# Patient Record
Sex: Female | Born: 1945
Health system: Southern US, Community
[De-identification: ages and names within clinical notes are randomized; demographics above are authoritative.]

## PROBLEM LIST (undated history)

## (undated) DIAGNOSIS — T7840XA Allergy, unspecified, initial encounter: Secondary | ICD-10-CM

## (undated) DIAGNOSIS — F329 Major depressive disorder, single episode, unspecified: Secondary | ICD-10-CM

## (undated) DIAGNOSIS — R062 Wheezing: Secondary | ICD-10-CM

## (undated) DIAGNOSIS — I499 Cardiac arrhythmia, unspecified: Secondary | ICD-10-CM

## (undated) DIAGNOSIS — I517 Cardiomegaly: Secondary | ICD-10-CM

## (undated) DIAGNOSIS — E1140 Type 2 diabetes mellitus with diabetic neuropathy, unspecified: Secondary | ICD-10-CM

## (undated) DIAGNOSIS — E785 Hyperlipidemia, unspecified: Secondary | ICD-10-CM

## (undated) DIAGNOSIS — T8859XA Other complications of anesthesia, initial encounter: Secondary | ICD-10-CM

## (undated) DIAGNOSIS — J189 Pneumonia, unspecified organism: Secondary | ICD-10-CM

## (undated) DIAGNOSIS — I340 Nonrheumatic mitral (valve) insufficiency: Secondary | ICD-10-CM

## (undated) DIAGNOSIS — F1190 Opioid use, unspecified, uncomplicated: Secondary | ICD-10-CM

## (undated) DIAGNOSIS — M51369 Other intervertebral disc degeneration, lumbar region without mention of lumbar back pain or lower extremity pain: Secondary | ICD-10-CM

## (undated) DIAGNOSIS — D649 Anemia, unspecified: Secondary | ICD-10-CM

## (undated) DIAGNOSIS — I255 Ischemic cardiomyopathy: Secondary | ICD-10-CM

## (undated) DIAGNOSIS — F419 Anxiety disorder, unspecified: Secondary | ICD-10-CM

## (undated) DIAGNOSIS — K559 Vascular disorder of intestine, unspecified: Secondary | ICD-10-CM

## (undated) DIAGNOSIS — I350 Nonrheumatic aortic (valve) stenosis: Secondary | ICD-10-CM

## (undated) DIAGNOSIS — F32A Depression, unspecified: Secondary | ICD-10-CM

## (undated) DIAGNOSIS — R011 Cardiac murmur, unspecified: Secondary | ICD-10-CM

## (undated) DIAGNOSIS — Z87898 Personal history of other specified conditions: Secondary | ICD-10-CM

## (undated) DIAGNOSIS — I2510 Atherosclerotic heart disease of native coronary artery without angina pectoris: Secondary | ICD-10-CM

## (undated) DIAGNOSIS — H9190 Unspecified hearing loss, unspecified ear: Secondary | ICD-10-CM

## (undated) DIAGNOSIS — M2141 Flat foot [pes planus] (acquired), right foot: Secondary | ICD-10-CM

## (undated) DIAGNOSIS — T4145XA Adverse effect of unspecified anesthetic, initial encounter: Secondary | ICD-10-CM

## (undated) DIAGNOSIS — M2142 Flat foot [pes planus] (acquired), left foot: Secondary | ICD-10-CM

## (undated) DIAGNOSIS — H269 Unspecified cataract: Secondary | ICD-10-CM

## (undated) DIAGNOSIS — I739 Peripheral vascular disease, unspecified: Secondary | ICD-10-CM

## (undated) DIAGNOSIS — K219 Gastro-esophageal reflux disease without esophagitis: Secondary | ICD-10-CM

## (undated) DIAGNOSIS — M1990 Unspecified osteoarthritis, unspecified site: Secondary | ICD-10-CM

## (undated) DIAGNOSIS — Z8619 Personal history of other infectious and parasitic diseases: Secondary | ICD-10-CM

## (undated) DIAGNOSIS — I10 Essential (primary) hypertension: Secondary | ICD-10-CM

## (undated) DIAGNOSIS — G629 Polyneuropathy, unspecified: Secondary | ICD-10-CM

## (undated) DIAGNOSIS — M5136 Other intervertebral disc degeneration, lumbar region: Secondary | ICD-10-CM

## (undated) DIAGNOSIS — Z9289 Personal history of other medical treatment: Secondary | ICD-10-CM

## (undated) DIAGNOSIS — M722 Plantar fascial fibromatosis: Secondary | ICD-10-CM

## (undated) DIAGNOSIS — I5020 Unspecified systolic (congestive) heart failure: Secondary | ICD-10-CM

## (undated) DIAGNOSIS — Z8719 Personal history of other diseases of the digestive system: Secondary | ICD-10-CM

## (undated) HISTORY — PX: TONSILLECTOMY: SUR1361

## (undated) HISTORY — PX: MOUTH SURGERY: SHX715

## (undated) HISTORY — PX: TENOTOMY ACHILLES TENDON: SUR1337

## (undated) HISTORY — DX: Hyperlipidemia, unspecified: E78.5

## (undated) HISTORY — DX: Flat foot (pes planus) (acquired), right foot: M21.41

## (undated) HISTORY — DX: Allergy, unspecified, initial encounter: T78.40XA

## (undated) HISTORY — DX: Personal history of other specified conditions: Z87.898

## (undated) HISTORY — DX: Unspecified osteoarthritis, unspecified site: M19.90

## (undated) HISTORY — PX: OTHER SURGICAL HISTORY: SHX169

## (undated) HISTORY — DX: Cardiac arrhythmia, unspecified: I49.9

## (undated) HISTORY — DX: Peripheral vascular disease, unspecified: I73.9

## (undated) HISTORY — DX: Unspecified cataract: H26.9

## (undated) HISTORY — DX: Anxiety disorder, unspecified: F41.9

## (undated) HISTORY — DX: Nonrheumatic aortic (valve) stenosis: I35.0

## (undated) HISTORY — DX: Personal history of other medical treatment: Z92.89

## (undated) HISTORY — DX: Anemia, unspecified: D64.9

## (undated) HISTORY — DX: Opioid use, unspecified, uncomplicated: F11.90

## (undated) HISTORY — DX: Depression, unspecified: F32.A

## (undated) HISTORY — DX: Flat foot (pes planus) (acquired), left foot: M21.42

## (undated) HISTORY — DX: Personal history of other infectious and parasitic diseases: Z86.19

## (undated) HISTORY — DX: Cardiac murmur, unspecified: R01.1

## (undated) HISTORY — DX: Gastro-esophageal reflux disease without esophagitis: K21.9

## (undated) HISTORY — PX: HUMERUS FRACTURE SURGERY: SHX670

## (undated) HISTORY — PX: OVARY SURGERY: SHX727

## (undated) HISTORY — DX: Other intervertebral disc degeneration, lumbar region: M51.36

## (undated) HISTORY — DX: Major depressive disorder, single episode, unspecified: F32.9

## (undated) HISTORY — DX: Other intervertebral disc degeneration, lumbar region without mention of lumbar back pain or lower extremity pain: M51.369

## (undated) HISTORY — DX: Type 2 diabetes mellitus with diabetic neuropathy, unspecified: E11.40

## (undated) HISTORY — PX: EYE SURGERY: SHX253

## (undated) HISTORY — DX: Plantar fascial fibromatosis: M72.2

## (undated) HISTORY — DX: Cardiomegaly: I51.7

## (undated) HISTORY — PX: ABDOMINAL HYSTERECTOMY: SHX81

## (undated) NOTE — *Deleted (*Deleted)
   [facility]  Name: Andrea Holden  MRN: [id number] DOB: [birth year]  Chief Complaint/ HPI  Andrea Holden,  57 y.o. , female presents for their Follow-Up CCM visit with the clinical pharmacist via telephone due to COVID-19 Pandemic.  PCP : Danelle Berry, PA-C  Their chronic conditions include: ***  Office Visits:***  Consult Visit:***  Medications: Outpatient Encounter Medications as of 04/18/2020  Medication Sig Note  . acetaminophen (TYLENOL) 325 MG tablet Take 2 tablets (650 mg total) by mouth every 6 (six) hours as needed for mild pain, moderate pain, headache or fever.   Marland Kitchen albuterol (VENTOLIN HFA) 108 (90 Base) MCG/ACT inhaler INHALE 2 PUFFS INTO THE LUNGS EVERY 6 HOURS AS NEEDED FOR WHEEZING OR SHORTNESS OF BREATH   . atorvastatin (LIPITOR) 10 MG tablet Take 1 tablet (10 mg total) by mouth at bedtime.   . Cholecalciferol (VITAMIN D3) 250 MCG (10000 UT) TABS Take 10,000 Units by mouth daily.    . clopidogrel (PLAVIX) 75 MG tablet TAKE 1 TABLET BY MOUTH ONCE A DAY   . fluticasone (FLONASE) 50 MCG/ACT nasal spray Place 2 sprays into both nostrils daily as needed. 01/06/2020: PRN allergies  . gabapentin (NEURONTIN) 400 MG capsule Take 1 capsule (400 mg total) by mouth 2 (two) times daily.   Marland Kitchen ipratropium-albuterol (DUONEB) 0.5-2.5 (3) MG/3ML SOLN Take 3 mLs by nebulization 3 (three) times daily as needed. (Patient taking differently: Take 3 mLs by nebulization 3 (three) times daily as needed (Shortness of breath). )   . Krill Oil 1000 MG CAPS Take 1,000 mg by mouth every other day.  (Patient not taking: Reported on 01/12/2020)   . lisinopril (ZESTRIL) 10 MG tablet Take 1 tablet (10 mg total) by mouth daily.   . montelukast (SINGULAIR) 10 MG tablet Take 1 tablet (10 mg total) by mouth at bedtime.   . Multiple Vitamins-Minerals (MULTIVITAMIN ADULTS 50+) TABS Take 1 tablet by mouth daily.   . pantoprazole (PROTONIX) 20 MG tablet Take 1 tablet (20 mg total) by  mouth daily.   Marland Kitchen Respiratory Therapy Supplies (NEBULIZER/TUBING/MOUTHPIECE) KIT Disp one nebulizer machine, tubing set and mouthpiece kit   . sertraline (ZOLOFT) 100 MG tablet TAKE 1 AND 1/2 TABLETS (150 MG TOTAL) BY MOUTH DAILY (Patient taking differently: Take 100 mg by mouth daily. )   . terbinafine (LAMISIL) 250 MG tablet Take 1 tablet (250 mg total) by mouth daily.   Marland Kitchen umeclidinium-vilanterol (ANORO ELLIPTA) 62.5-25 MCG/INH AEPB Inhale 1 puff into the lungs daily.   . varenicline (CHANTIX) 1 MG tablet Take 0.5 mg tablet by mouth 1x daily for 3 days, then increase to 0.5 mg tablet 2x daily for 4 days, then increase to 1 mg tablet 2x daily.   . vitamin C (ASCORBIC ACID) 500 MG tablet Take 1,000 mg by mouth every other day.  (Patient not taking: Reported on 01/12/2020)   . zinc gluconate 50 MG tablet Take 50 mg by mouth daily.    No facility-administered encounter medications on file as of 04/18/2020.     Current Diagnosis/Assessment:  Goals Addressed   None     {[facility] Diagnosis/[phone]}

---

## 1968-07-01 HISTORY — PX: GALLBLADDER SURGERY: SHX652

## 1968-07-01 HISTORY — PX: CHOLECYSTECTOMY: SHX55

## 2006-09-03 ENCOUNTER — Ambulatory Visit: Payer: Self-pay | Admitting: Family Medicine

## 2007-05-13 ENCOUNTER — Emergency Department: Payer: Self-pay | Admitting: Internal Medicine

## 2008-03-30 ENCOUNTER — Ambulatory Visit: Payer: Self-pay | Admitting: Orthopedic Surgery

## 2008-04-13 ENCOUNTER — Ambulatory Visit: Payer: Self-pay | Admitting: Orthopedic Surgery

## 2008-07-01 HISTORY — PX: GASTRIC BYPASS: SHX52

## 2008-12-22 ENCOUNTER — Ambulatory Visit: Payer: Self-pay | Admitting: Family Medicine

## 2008-12-29 ENCOUNTER — Encounter: Payer: Self-pay | Admitting: Sports Medicine

## 2008-12-29 ENCOUNTER — Ambulatory Visit: Payer: Self-pay | Admitting: Family Medicine

## 2009-01-29 ENCOUNTER — Ambulatory Visit: Payer: Self-pay | Admitting: Family Medicine

## 2009-01-29 ENCOUNTER — Encounter: Payer: Self-pay | Admitting: Sports Medicine

## 2009-03-01 ENCOUNTER — Encounter: Payer: Self-pay | Admitting: Sports Medicine

## 2009-08-31 ENCOUNTER — Encounter: Payer: Self-pay | Admitting: Family Medicine

## 2009-09-29 ENCOUNTER — Encounter: Payer: Self-pay | Admitting: Family Medicine

## 2009-10-29 ENCOUNTER — Encounter: Payer: Self-pay | Admitting: Family Medicine

## 2009-10-31 ENCOUNTER — Encounter: Admission: RE | Admit: 2009-10-31 | Discharge: 2009-10-31 | Payer: Self-pay | Admitting: Neurology

## 2009-11-25 ENCOUNTER — Observation Stay: Payer: Self-pay | Admitting: Orthopedic Surgery

## 2009-11-28 ENCOUNTER — Encounter: Payer: Self-pay | Admitting: Internal Medicine

## 2009-11-29 ENCOUNTER — Encounter: Payer: Self-pay | Admitting: Internal Medicine

## 2009-11-29 ENCOUNTER — Encounter: Payer: Self-pay | Admitting: Family Medicine

## 2009-12-29 ENCOUNTER — Encounter: Payer: Self-pay | Admitting: Internal Medicine

## 2010-03-09 ENCOUNTER — Encounter: Payer: Self-pay | Admitting: Orthopedic Surgery

## 2010-03-31 ENCOUNTER — Encounter: Payer: Self-pay | Admitting: Orthopedic Surgery

## 2010-05-01 ENCOUNTER — Encounter: Payer: Self-pay | Admitting: Orthopedic Surgery

## 2010-05-31 ENCOUNTER — Encounter: Payer: Self-pay | Admitting: Orthopedic Surgery

## 2010-10-24 ENCOUNTER — Ambulatory Visit: Payer: Self-pay | Admitting: Specialist

## 2010-10-30 ENCOUNTER — Ambulatory Visit: Payer: Self-pay | Admitting: Specialist

## 2010-11-16 ENCOUNTER — Ambulatory Visit: Payer: Self-pay | Admitting: Specialist

## 2010-11-23 ENCOUNTER — Ambulatory Visit: Payer: Self-pay | Admitting: Gastroenterology

## 2010-11-23 ENCOUNTER — Emergency Department: Payer: Self-pay | Admitting: Internal Medicine

## 2010-11-27 LAB — PATHOLOGY REPORT

## 2011-02-08 ENCOUNTER — Encounter (HOSPITAL_COMMUNITY): Payer: Self-pay | Admitting: Radiology

## 2011-02-08 ENCOUNTER — Emergency Department (HOSPITAL_COMMUNITY): Payer: 59

## 2011-02-08 ENCOUNTER — Emergency Department (HOSPITAL_COMMUNITY)
Admission: EM | Admit: 2011-02-08 | Discharge: 2011-02-08 | Disposition: A | Discharge status: Home / Self Care | Payer: 59 | Attending: Emergency Medicine | Admitting: Emergency Medicine

## 2011-02-08 DIAGNOSIS — M545 Low back pain, unspecified: Secondary | ICD-10-CM | POA: Insufficient documentation

## 2011-02-08 DIAGNOSIS — J45909 Unspecified asthma, uncomplicated: Secondary | ICD-10-CM | POA: Insufficient documentation

## 2011-02-08 DIAGNOSIS — I517 Cardiomegaly: Secondary | ICD-10-CM | POA: Insufficient documentation

## 2011-02-08 DIAGNOSIS — M546 Pain in thoracic spine: Secondary | ICD-10-CM | POA: Insufficient documentation

## 2011-02-08 DIAGNOSIS — R079 Chest pain, unspecified: Secondary | ICD-10-CM | POA: Insufficient documentation

## 2011-02-08 DIAGNOSIS — E119 Type 2 diabetes mellitus without complications: Secondary | ICD-10-CM | POA: Insufficient documentation

## 2011-02-08 DIAGNOSIS — I10 Essential (primary) hypertension: Secondary | ICD-10-CM | POA: Insufficient documentation

## 2011-02-08 DIAGNOSIS — R0602 Shortness of breath: Secondary | ICD-10-CM | POA: Insufficient documentation

## 2011-02-08 HISTORY — DX: Essential (primary) hypertension: I10

## 2011-02-08 LAB — POCT I-STAT TROPONIN I: Troponin i, poc: 0.02 ng/mL (ref 0.00–0.08)

## 2011-02-08 LAB — COMPREHENSIVE METABOLIC PANEL
ALT: 15 U/L (ref 0–35)
AST: 15 U/L (ref 0–37)
Albumin: 3.6 g/dL (ref 3.5–5.2)
Alkaline Phosphatase: 73 U/L (ref 39–117)
BUN: 34 mg/dL — ABNORMAL HIGH (ref 6–23)
CO2: 27 mEq/L (ref 19–32)
Calcium: 9.5 mg/dL (ref 8.4–10.5)
Chloride: 100 mEq/L (ref 96–112)
Creatinine, Ser: 0.87 mg/dL (ref 0.50–1.10)
GFR calc Af Amer: >60 mL/min (ref >60)
GFR calc non Af Amer: >60 mL/min (ref >60)
Glucose, Bld: 164 mg/dL — ABNORMAL HIGH (ref 70–99)
Potassium: 4.8 mEq/L (ref 3.5–5.1)
Sodium: 137 mEq/L (ref 135–145)
Total Bilirubin: 0.2 mg/dL — ABNORMAL LOW (ref 0.3–1.2)
Total Protein: 7.3 g/dL (ref 6.0–8.3)

## 2011-02-08 LAB — CBC
HCT: 37.5 % (ref 36.0–46.0)
Hemoglobin: 12.4 g/dL (ref 12.0–15.0)
MCH: 28.1 pg (ref 26.0–34.0)
MCHC: 33.1 g/dL (ref 30.0–36.0)
MCV: 84.8 fL (ref 78.0–100.0)
Platelets: 195 10*3/uL (ref 150–400)
RBC: 4.42 MIL/uL (ref 3.87–5.11)
RDW: 14.2 % (ref 11.5–15.5)
WBC: 11.6 10*3/uL — ABNORMAL HIGH (ref 4.0–10.5)

## 2011-02-08 LAB — DIFFERENTIAL
Basophils Absolute: 0 10*3/uL (ref 0.0–0.1)
Basophils Relative: 0 % (ref 0–1)
Eosinophils Absolute: 0.1 10*3/uL (ref 0.0–0.7)
Eosinophils Relative: 1 % (ref 0–5)
Lymphocytes Relative: 10 % — ABNORMAL LOW (ref 12–46)
Lymphs Abs: 1.1 10*3/uL (ref 0.7–4.0)
Monocytes Absolute: 1.1 10*3/uL — ABNORMAL HIGH (ref 0.1–1.0)
Monocytes Relative: 10 % (ref 3–12)
Neutro Abs: 9.2 10*3/uL — ABNORMAL HIGH (ref 1.7–7.7)
Neutrophils Relative %: 79 % — ABNORMAL HIGH (ref 43–77)

## 2011-02-08 LAB — CK TOTAL AND CKMB (NOT AT ARMC)
CK, MB: 2.6 ng/mL (ref 0.3–4.0)
Relative Index: INVALID (ref 0.0–2.5)
Total CK: 81 U/L (ref 7–177)

## 2011-02-08 LAB — D-DIMER, QUANTITATIVE (NOT AT ARMC): D-Dimer, Quant: 0.95 ug/mL-FEU — ABNORMAL HIGH (ref 0.00–0.48)

## 2011-02-08 MED ORDER — IOHEXOL 350 MG/ML SOLN
100.0000 mL | Freq: Once | INTRAVENOUS | Status: AC | PRN
  Administered 2011-02-08: 100 mL via INTRAVENOUS

## 2011-02-12 ENCOUNTER — Ambulatory Visit: Payer: Self-pay | Admitting: Specialist

## 2011-03-05 ENCOUNTER — Inpatient Hospital Stay: Payer: Self-pay | Admitting: Bariatrics

## 2011-03-21 ENCOUNTER — Ambulatory Visit: Payer: Self-pay | Admitting: Specialist

## 2011-04-01 ENCOUNTER — Ambulatory Visit: Payer: Self-pay | Admitting: Specialist

## 2011-04-02 ENCOUNTER — Encounter: Payer: Self-pay | Admitting: Specialist

## 2011-05-02 ENCOUNTER — Encounter: Payer: Self-pay | Admitting: Specialist

## 2011-06-01 ENCOUNTER — Encounter: Payer: Self-pay | Admitting: Specialist

## 2011-07-02 ENCOUNTER — Encounter: Payer: Self-pay | Admitting: Specialist

## 2011-07-02 HISTORY — PX: COLON SURGERY: SHX602

## 2011-08-14 ENCOUNTER — Encounter: Payer: Self-pay | Admitting: Family Medicine

## 2011-08-30 ENCOUNTER — Encounter: Payer: Self-pay | Admitting: Family Medicine

## 2011-09-30 ENCOUNTER — Encounter: Payer: Self-pay | Admitting: Family Medicine

## 2011-10-30 ENCOUNTER — Encounter: Payer: Self-pay | Admitting: Family Medicine

## 2011-11-30 ENCOUNTER — Encounter: Payer: Self-pay | Admitting: Family Medicine

## 2011-12-30 ENCOUNTER — Encounter: Payer: Self-pay | Admitting: Family Medicine

## 2012-01-30 ENCOUNTER — Encounter: Payer: Self-pay | Admitting: Family Medicine

## 2012-07-01 DIAGNOSIS — D649 Anemia, unspecified: Secondary | ICD-10-CM

## 2012-07-01 HISTORY — DX: Anemia, unspecified: D64.9

## 2012-07-28 ENCOUNTER — Encounter: Payer: Self-pay | Admitting: Internal Medicine

## 2012-08-01 ENCOUNTER — Encounter: Payer: Self-pay | Admitting: Internal Medicine

## 2012-08-29 ENCOUNTER — Encounter: Payer: Self-pay | Admitting: Internal Medicine

## 2012-09-29 ENCOUNTER — Encounter: Payer: Self-pay | Admitting: Internal Medicine

## 2012-10-29 ENCOUNTER — Encounter: Payer: Self-pay | Admitting: Internal Medicine

## 2013-06-03 ENCOUNTER — Ambulatory Visit (INDEPENDENT_AMBULATORY_CARE_PROVIDER_SITE_OTHER): Payer: Medicare (Managed Care) | Admitting: Podiatrist

## 2013-06-03 ENCOUNTER — Ambulatory Visit (INDEPENDENT_AMBULATORY_CARE_PROVIDER_SITE_OTHER): Payer: Medicare (Managed Care)

## 2013-06-03 ENCOUNTER — Encounter: Payer: Self-pay | Admitting: Podiatrist

## 2013-06-03 VITALS — BP 146/65 | HR 60 | Resp 16 | Ht 63.0 in | Wt 220.0 lb

## 2013-06-03 DIAGNOSIS — B351 Tinea unguium: Secondary | ICD-10-CM

## 2013-06-03 DIAGNOSIS — M79671 Pain in right foot: Secondary | ICD-10-CM

## 2013-06-03 DIAGNOSIS — M79609 Pain in unspecified limb: Secondary | ICD-10-CM

## 2013-06-03 NOTE — Progress Notes (Signed)
   Subjective:    Patient ID: Andrea Holden, female    DOB: 01/29/1946, 67 y.o.   MRN: [id number]  Patient presents today as a new patient with compaints of bilateral great toenail pain and calluses bilaterally.  States they are painful and he has no relief.  she also has pain at the ankle and states she is getting braces from Dr. Donell Beers. HPI Comments: N PAIN L B/L GREAT TOENAILS / CALLUS B/L D 23YRS O SLOWLY C WORSE A EVERYTHNING T PT TRIMS TOENAILS, PUMA STONE    N PAIN/SWELLING L RIGHT FOOT -DORSAL AND ANKLE D 59YR O ? C WORSE/BETTER A WALKING, TOUCHING IT T TOPICAL GEL, SOAKS IN FOOT SOAP     Review of Systems  Constitutional: Positive for activity change.  HENT: Negative.   Eyes: Positive for visual disturbance.  Respiratory: Positive for wheezing.   Cardiovascular: Positive for leg swelling.  Gastrointestinal: Positive for nausea and vomiting.  Endocrine: Negative.   Genitourinary: Negative.   Musculoskeletal: Positive for back pain.       JOINT PAIN DIFFICULTY WALKING  Skin:       CHANGE IN NAILS  Allergic/Immunologic: Negative.   Neurological: Positive for weakness, light-headedness and numbness.  Hematological: Bruises/bleeds easily.  Psychiatric/Behavioral: Negative.        Objective:   Physical Exam GENERAL APPEARANCE: Alert,No acute distress.  VASCULAR: Pedal pulses palpable dp and pt bilateral.  Capillary refill time is immediate to all digits,  Proximal to distal cooling it warm to warm.   NEUROLOGIC: sensation is decreased epicritically and protectively to 5.07 monofilament at 3/5 sites bilateral.  Light touch is intact bilateral, vibratory sensation intact bilateral,  MUSCULOSKELETAL: pes planus deformity is present bilateral.  Pain along the sinus tarsi is noted right.   DERMATOLOGIC: symptomatic, hyperkeratotic lesions are present bilaterally on the great toes. she has mildly incurvated toenails present on the medial border of the left hallux and  both borders of the right hallux nails.  They are not infected but uncomfortable    Assessment & Plan:  Diabetes with pes planus, neuropathy; calluses and nail discomfort.  Conservative therapies were recommended and carried out at today's visit consisting of a nail trim and callus trim.  Recommended diabetic shoes and patient is not interested.  Recommended an injection on the symptomatic ankle and she is waiting for a brace from Dr. Donell Beers.  Patient to return at prn request.

## 2013-06-03 NOTE — Patient Instructions (Signed)
Diabetes and Foot Care Diabetes may cause you to have problems because of poor blood supply (circulation) to your feet and legs. This may cause the skin on your feet to become thinner, break easier, and heal more slowly. Your skin may become dry, and the skin may peel and crack. You may also have nerve damage in your legs and feet causing decreased feeling in them. You may not notice minor injuries to your feet that could lead to infections or more serious problems. Taking care of your feet is one of the most important things you can do for yourself.  HOME CARE INSTRUCTIONS  Wear shoes at all times, even in the house. Do not go barefoot. Bare feet are easily injured.  Check your feet daily for blisters, cuts, and redness. If you cannot see the bottom of your feet, use a mirror or ask someone for help.  Wash your feet with warm water (do not use hot water) and mild soap. Then pat your feet and the areas between your toes until they are completely dry. Do not soak your feet as this can dry your skin.  Apply a moisturizing lotion or petroleum jelly (that does not contain alcohol and is unscented) to the skin on your feet and to dry, brittle toenails. Do not apply lotion between your toes.  Trim your toenails straight across. Do not dig under them or around the cuticle. File the edges of your nails with an emery board or nail file.  Do not cut corns or calluses or try to remove them with medicine.  Wear clean socks or stockings every day. Make sure they are not too tight. Do not wear knee-high stockings since they may decrease blood flow to your legs.  Wear shoes that fit properly and have enough cushioning. To break in new shoes, wear them for just a few hours a day. This prevents you from injuring your feet. Always look in your shoes before you put them on to be sure there are no objects inside.  Do not cross your legs. This may decrease the blood flow to your feet.  If you find a minor scrape,  cut, or break in the skin on your feet, keep it and the skin around it clean and dry. These areas may be cleansed with mild soap and water. Do not cleanse the area with peroxide, alcohol, or iodine.  When you remove an adhesive bandage, be sure not to damage the skin around it.  If you have a wound, look at it several times a day to make sure it is healing.  Do not use heating pads or hot water bottles. They may burn your skin. If you have lost feeling in your feet or legs, you may not know it is happening until it is too late.  Make sure your health care provider performs a complete foot exam at least annually or more often if you have foot problems. Report any cuts, sores, or bruises to your health care provider immediately. SEEK MEDICAL CARE IF:   You have an injury that is not healing.  You have cuts or breaks in the skin.  You have an ingrown nail.  You notice redness on your legs or feet.  You feel burning or tingling in your legs or feet.  You have pain or cramps in your legs and feet.  Your legs or feet are numb.  Your feet always feel cold. SEEK IMMEDIATE MEDICAL CARE IF:   There is increasing redness,   swelling, or pain in or around a wound.  There is a red line that goes up your leg.  Pus is coming from a wound.  You develop a fever or as directed by your health care provider.  You notice a bad smell coming from an ulcer or wound. Document Released: 06/14/2000 Document Revised: 02/17/2013 Document Reviewed: 11/24/2012 Exit[employer] Patient Information 2014 [employer].  

## 2014-07-01 HISTORY — PX: OTHER SURGICAL HISTORY: SHX169

## 2014-09-07 DIAGNOSIS — Z8719 Personal history of other diseases of the digestive system: Secondary | ICD-10-CM | POA: Insufficient documentation

## 2014-09-07 DIAGNOSIS — K56609 Unspecified intestinal obstruction, unspecified as to partial versus complete obstruction: Secondary | ICD-10-CM | POA: Insufficient documentation

## 2014-10-11 ENCOUNTER — Inpatient Hospital Stay
Admit: 2014-10-11 | Disposition: A | Discharge status: Home / Self Care | Payer: Self-pay | Attending: Internal Medicine | Admitting: Internal Medicine

## 2014-10-11 LAB — CBC
HCT: 17.1 % — ABNORMAL LOW (ref 35.0–47.0)
HGB: 5.5 g/dL — ABNORMAL LOW (ref 12.0–16.0)
MCH: 26 pg (ref 26.0–34.0)
MCHC: 32.2 g/dL (ref 32.0–36.0)
MCV: 81 fL (ref 80–100)
Platelet: 427 10*3/uL (ref 150–440)
RBC: 2.12 10*6/uL — ABNORMAL LOW (ref 3.80–5.20)
RDW: 15.3 % — ABNORMAL HIGH (ref 11.5–14.5)
WBC: 14.4 10*3/uL — ABNORMAL HIGH (ref 3.6–11.0)

## 2014-10-11 LAB — CBC WITH DIFFERENTIAL/PLATELET
Basophil #: 0.1 10*3/uL (ref 0.0–0.1)
Basophil %: 0.9 %
Eosinophil #: 0.2 10*3/uL (ref 0.0–0.7)
Eosinophil %: 1.2 %
HCT: 18.9 % — ABNORMAL LOW (ref 35.0–47.0)
HGB: 6.1 g/dL — ABNORMAL LOW (ref 12.0–16.0)
Lymphocyte #: 3.9 10*3/uL — ABNORMAL HIGH (ref 1.0–3.6)
Lymphocyte %: 22.5 %
MCH: 26.4 pg (ref 26.0–34.0)
MCHC: 32.5 g/dL (ref 32.0–36.0)
MCV: 81 fL (ref 80–100)
Monocyte #: 1.3 x10 3/mm — ABNORMAL HIGH (ref 0.2–0.9)
Monocyte %: 7.6 %
Neutrophil #: 11.8 10*3/uL — ABNORMAL HIGH (ref 1.4–6.5)
Neutrophil %: 67.8 %
Platelet: 373 10*3/uL (ref 150–440)
RBC: 2.33 10*6/uL — ABNORMAL LOW (ref 3.80–5.20)
RDW: 15.1 % — ABNORMAL HIGH (ref 11.5–14.5)
WBC: 17.4 10*3/uL — ABNORMAL HIGH (ref 3.6–11.0)

## 2014-10-11 LAB — COMPREHENSIVE METABOLIC PANEL
Albumin: 3.1 g/dL — ABNORMAL LOW
Alkaline Phosphatase: 66 U/L
Anion Gap: 6 — ABNORMAL LOW (ref 7–16)
BUN: 42 mg/dL — ABNORMAL HIGH
Bilirubin,Total: 0.4 mg/dL
Calcium, Total: 8.5 mg/dL — ABNORMAL LOW
Chloride: 103 mmol/L
Co2: 25 mmol/L
Creatinine: 0.83 mg/dL
EGFR (African American): >60
EGFR (Non-African Amer.): >60
Glucose: 209 mg/dL — ABNORMAL HIGH
Potassium: 4.1 mmol/L
SGOT(AST): 21 U/L
SGPT (ALT): 13 U/L — ABNORMAL LOW
Sodium: 134 mmol/L — ABNORMAL LOW
Total Protein: 6.3 g/dL — ABNORMAL LOW

## 2014-10-11 LAB — TROPONIN I: Troponin-I: <0.03 ng/mL

## 2014-10-11 LAB — TSH: Thyroid Stimulating Horm: 0.369 u[IU]/mL

## 2014-10-12 LAB — CBC WITH DIFFERENTIAL/PLATELET
Basophil #: 0.1 10*3/uL (ref 0.0–0.1)
Basophil %: 0.4 %
Eosinophil #: 0.1 10*3/uL (ref 0.0–0.7)
Eosinophil %: 0.5 %
HCT: 20.6 % — ABNORMAL LOW (ref 35.0–47.0)
HGB: 6.7 g/dL — ABNORMAL LOW (ref 12.0–16.0)
Lymphocyte #: 1.5 10*3/uL (ref 1.0–3.6)
Lymphocyte %: 10.9 %
MCH: 26.7 pg (ref 26.0–34.0)
MCHC: 32.4 g/dL (ref 32.0–36.0)
MCV: 82 fL (ref 80–100)
Monocyte #: 0.7 x10 3/mm (ref 0.2–0.9)
Monocyte %: 5.2 %
Neutrophil #: 11.5 10*3/uL — ABNORMAL HIGH (ref 1.4–6.5)
Neutrophil %: 83 %
Platelet: 298 10*3/uL (ref 150–440)
RBC: 2.5 10*6/uL — ABNORMAL LOW (ref 3.80–5.20)
RDW: 14.9 % — ABNORMAL HIGH (ref 11.5–14.5)
WBC: 13.9 10*3/uL — ABNORMAL HIGH (ref 3.6–11.0)

## 2014-10-12 LAB — BASIC METABOLIC PANEL
Anion Gap: 2 — ABNORMAL LOW (ref 7–16)
BUN: 39 mg/dL — ABNORMAL HIGH
Calcium, Total: 7.6 mg/dL — ABNORMAL LOW
Chloride: 109 mmol/L
Co2: 24 mmol/L
Creatinine: 0.66 mg/dL
EGFR (African American): >60
EGFR (Non-African Amer.): >60
Glucose: 146 mg/dL — ABNORMAL HIGH
Potassium: 4.3 mmol/L
Sodium: 135 mmol/L

## 2014-10-12 LAB — HEMOGLOBIN: HGB: 8.9 g/dL — ABNORMAL LOW (ref 12.0–16.0)

## 2014-10-13 LAB — HEMOGLOBIN: HGB: 8.3 g/dL — ABNORMAL LOW (ref 12.0–16.0)

## 2014-10-14 LAB — HEMOGLOBIN: HGB: 8.3 g/dL — ABNORMAL LOW (ref 12.0–16.0)

## 2014-10-15 LAB — HEMOGLOBIN: HGB: 9 g/dL — ABNORMAL LOW (ref 12.0–16.0)

## 2014-10-30 NOTE — H&P (Addendum)
PATIENT NAME:  Andrea Holden, Andrea Holden#:  [id number] DATE OF BIRTH:  06/24/1946  DATE OF ADMISSION:  10/11/2014  PRIMARY CARE PHYSICIAN: None; she just moved from [place].  EMERGENCY ROOM PHYSICIAN: Jene Every, MD  CHIEF COMPLAINT: Sweating, dizziness.   HISTORY OF PRESENT ILLNESS: This is a 69 year old female patient brought in because of sweating and dizziness. The patient has been having black stool since last week on and off. The patient was very pale when she came. She was noticed to have sweating profusely and also dizziness. She had an episode of near syncope in the last week 2 times. She has had severe abdominal pain in the midepigastric region a month ago and then she ignored. She says she uses Advil on and off for joint pain. She also uses oxycodone for her joint pain. She had gastric bypass surgery 3 years ago by Dr. Smitty Cords. She says she lost weight and she is not diabetic anymore. She also says that she was admitted to the [facility] a month ago for small bowel obstruction, had emergency surgery for that. She also says that she had an EGD at that time and the results were not showing any ulcer at that time. Her hemoglobin also was stable a month ago. The patient is moving from [place] and she does not have any primary doctor yet. No nausea. No vomiting. No hematemesis.   PAST MEDICAL HISTORY: Significant for neuropathy, COPD, depression, hypertension.   ALLERGIES: TAPE.  SOCIAL HISTORY: The patient lives with her daughter. Smokes about 1/2 pack per day. She used to smoke 1-1/2 packs before, now in the past 1 year she is smoking only 1/2 pack per day and she is also on Chantix. No alcohol. No drugs.   PAST SURGICAL HISTORY: Significant for history of gastric bypass 3 years ago and also history of surgery for small bowel obstruction a month ago at [facility]  FAMILY HISTORY: Significant for brother who had a heart attack and diabetes. Father had heart disease and stent  placement in the heart. Mother has history of hypertension.   MEDICATIONS: Lisinopril 10 mg p.o. daily, Neurontin 100 mg 3 capsules t.i.d., Zoloft 100 mg p.o. daily, metoprolol 25 mg p.o. daily, furosemide 40 mg p.o. daily, Chantix 1 mg tablet p.o. b.i.d.   REVIEW OF SYSTEMS: CONSTITUTIONAL: The patient has no fever, no fatigue.  EYES: No blurred vision. No cataracts.  ENT: No tinnitus. No epistaxis. No difficulty swallowing.  RESPIRATORY: Has some cough. No wheezing. No COPD. CARDIOVASCULAR: No chest pain. No orthopnea. No pedal edema.  GASTROINTESTINAL: Has black tarry stools for about 1 week, also had mid epigastric abdominal pain before this. No nausea. No vomiting. No jaundice.  GENITOURINARY: No dysuria.  ENDOCRINE: No polyuria or nocturia.  HEMATOLOGIC: Has anemia at this time. No easy bruising.  INTEGUMENT: No skin rashes.  MUSCULOSKELETAL: Does have history of neuropathy. PSYCHIATRIC: Has history of depression.  NEUROLOGIC: No history of strokes.   PHYSICAL EXAMINATION: VITAL SIGNS: Temperature 98.1, heart rate 100, blood pressure 96/43, sats 100% on room air. The patient's repeat blood pressure is 126/103, heart rate 86. GENERAL: The patient is alert, awake and oriented, a 69 year old female appearing very pale and answers questions appropriately.  HEAD: Normocephalic, atraumatic.  EYES: The patient's pupils are equal and reacting to light. Extraocular movements are intact. No scleral icterus. Conjunctivae are very pale.  EARS: Hearing is intact. Tympanic membranes clear.  MOUTH: Mucous membranes dry.  NECK: Supple. No JVD. No  lymphadenopathy. Normal range of motion. No thyroid enlargement.  RESPIRATORY: Clear to auscultation bilaterally. No wheeze. No rales.  CARDIOVASCULAR: S1, S2 regular rate and rhythm. No murmurs. No thrills. Good pedal pulse. Good femoral pulse. No extremity edema.  ABDOMEN: Mild mid epigastric tenderness present. No hepatosplenomegaly. Stool guaiac  positive. MUSCULOSKELETAL: Strength 5/5 in upper and lower extremities.  SKIN: Clinical dehydration present. Decreased skin turgor.  NEUROLOGIC: Cranial nerves II through XII intact. Power 5/5 in upper and lower extremities. Sensory is intact. DTRs 2+ bilaterally.  PSYCHIATRIC: Motor and affect are within normal limits.   DIAGNOSTIC DATA: White count 14.4, hemoglobin 5.5, hematocrit 17.1, platelets 427,000.   Electrolytes: Sodium 134, potassium 4.1, chloride 103, bicarb 25, BUN 42, creatinine 0.83, glucose 209. Troponin less than 0.03.   Chest x-ray: No acute abnormality.   EKG: Normal sinus rhythm with 93 beats per minute.   ASSESSMENT AND PLAN: 1.  This is a 69 year old female with melena and severe symptomatic anemia, admitted to hospitalist service, transfused a unit of packed RBC. Continue Protonix drip IV and obtain gastroenterology consult for possible EGD.  2.  The patient has history of gastric bypass, possible that she has a gastric anastomotic ulcer. Follow up with gastroenterology consult recommendation, [facility] She says [facility]  3.  Hypertension. The patient is admitted for gastrointestinal bleed. Hold her hypertensive medications.  4.  History of depression and neuropathy. Hold her p.o. medications because of gastrointestinal bleed. Continue n.p.o. She is hemodynamically stable at this time so we admitted her to telemetry. Monitor CBC closely. If she drops and develops hemodynamic instability, she will be moved to intensive care unit at that time.   TIME SPENT: About 55 minutes.  ____________________________ Katha HammingSnehalatha Jamesen Stahnke, MD sk:sb D: 10/11/2014 14:14:40 ET T: 10/11/2014 14:48:34 ET JOB#: [id number]  cc: Katha HammingSnehalatha Drayden Lukas, MD, <Dictator> Katha HammingSNEHALATHA Lilianah Buffin MD ELECTRONICALLY SIGNED 11/01/2014 21:17

## 2014-10-30 NOTE — Consult Note (Signed)
Details:   - GI Note:  EGD today:  large ulcer with adherent clot just above GJ anastamosis.  Treated with epi and bipolar cautery but limited treatment due to large clot which could not be fully removed.  High risk for re-bleed.   Plan: - cont PPI drip for 72 hours.  - start cararate 1 gr TID - h pylori stool antigen - transfer to [facility] ( per Dr Smitty CordsBruce) if has further bleeding.  - f/u Hgb closely - stop smoking, no NSAIDS.  - if no further bleeding and hgb stable, will need to f/u with Dr Smitty CordsBruce in clinic next week for likely revision.   Electronic Signatures: Dow Adolphein, Tiffanie Blassingame (MD)  (Signed 14-Apr-16 11:59)  Authored: Details   Last Updated: 14-Apr-16 11:59 by Dow Adolphein, Hrithik Boschee (MD)

## 2014-10-30 NOTE — Discharge Summary (Addendum)
PATIENT NAME:  Andrea Holden, Andrea Holden MR#:  [id number] DATE OF BIRTH:  [birth year]  DATE OF ADMISSION:  10/11/2014 DATE OF DISCHARGE:  10/15/2014  DISCHARGE DIAGNOSES:  1.  Upper gastrointestinal bleed with  anastomotic ulcer 2.  Acute severe symptomatic anemia status post 2 units of transfusion. 3.  Chronic obstructive pulmonary disease.   4.  Tobacco abuse. 5.  Gastric bypass with anastomotic ulcers.  DISCHARGE MEDICATIONS:  Zoloft 100 mg p.o. daily, Neurontin 150 capsule p.o. t.i.d., lisinopril 10 mg p.o. daily, metoprolol succinate 25 mg 1 tablet daily, Chantix 1 tablet p.o. b.i.d., furosemide 40 mg p.o. daily, sucralfate 1 gram that is 10 mL every 6 hours for 1 month, pantoprazole 40 mg p.o. b.i.d.  Note that the pantoprazole and sucralfate are new medications and she needs at least for 8 weeks.  FOLLOWUP:  The patient was advised to follow up with Dr. Smitty CordsBruce, her bariatric surgeon, next week for possible revision.   CONSULTATION: Gastroenterology consult with Dr. Dow AdolphMatthew Rein.  PROCEDURES:  Endoscopystric bypass with a small-sized pouch disrupted staple                     line and multiple ulcers at GJ anastamosis.                    - Large gastric ulcer in pouch just below GEJ containing                     adherent clot. Injected. Treated with a bipolar probe.                    - The remnant stomach is no longer isolated. Able to pass                     endoscope from anastamosis into remnant stomach through                     fistula at GJ anastamosis. Remnant stomach was normal.                    - Normal examined duodenum.                    - Normal examined jejunum. Recommendation:    - Observe patient in GI recovery unit.                    - Cont PPI drip for total of 72 hours.                    - monitor Hgb, transfuse as necessary.                    - start carafate 1 gr TID                    - Surgical consult.                    - Will likely need revision of  anastomsis to exlcude                     remnant stomach.                    - Clear liquid diet.                    -  Continue present medications.                    - The findings and recommendations were discussed with the                     patient.                    - The findings and recommendations were discussed with the                     patient's family. Procedure Code(s): --- Professional ---                    [phone], Esophagogastroduodenoscopy, flexible, transoral;                     with control of bleeding, any method CPT copyright 2014 American Medical Association. All rights reserved. The codes documented in this report are preliminary and upon coder review may  be revised to meet current compliance requirements. Kathalene Frames, MD  10/13/2014 10:56:31 AM This report has been signed electronically. Number of Addenda: 0 Note Initiated On: 10/13/2014 9:57 AM      [facility]. This.  The patient has normal duodenum, normal jejunum.   The patient is on PPI drip for 72 hours.d/ced home with ppi and sucralfate.  HOSPITAL COURSE: This patient is a 69 year old female patient admitted on April 12 because of sweating and dizziness. The patient noted to have black stool since 1 week and also epigastric abdominal pain which was transient. The patient also had near syncopal episode before she was admitted.  Found to have severe anemia with hemoglobin 5.5, hematocrit 17.1 on admission.  The patient admitted to hospitalist service for severe symptomatic anemia. Transfused 2 units of packed RBCs, started on IV Protonix drip. The patient thought to have melena probably secondary to upper GI bleed, so she is receiving Protonix drip. Seen by Dr. Dow Adolph and the patient had upper endoscopy done and the results are described as above. The patient remained on Protonix for 3 days and hemoglobin stayed stable after EGD and cauterization of the ulcer. Hemoglobin stayed  stable at 9.  Did not require further transfusions.  The patient advised to follow up with her bariatric surgeon regarding revision of gastric bypass. The patient advised to continue Protonix and also Carafate. The patient tolerated the clear liquid diet initially and she also tolerated a mechanical soft diet later. She is advised to continue soft diet for a few days and then resume regular diet.   OTHER DIAGNOSES INCLUDE: 1.  History of neuropathy, history of tobacco abuse.  She is on Chantix now.  2.  History of gastric bypass now with anastomotic ulcer and possible revision surgery.  3.  Hypertension, controlled.  4.  Chronic obstructive pulmonary disease, stable.   PHYSICAL EXAMINATION:   DISCHARGE VITAL SIGNS:  Temperature 97.5 Fahrenheit, heart rate 62, blood pressure 104/58, saturations 97% on room air.  CARDIOVASCULAR: S1, S2, regular.  LUNGS: Clear to auscultation. No wheeze. No rales.  ABDOMEN: Soft, nontender, nondistended.  Bowel sounds present.  EXTREMITIES:  No edema, no cyanosis, no clubbing.  NEUROLOGIC:  No focal deficits.   TIME SPENT: More than 30 minutes.    ____________________________ Katha Hamming, MD sk:sp D: 10/15/2014 22:36:00 ET T: 10/16/2014 09:22:04 ET JOB#: [id number]  cc: Katha Hamming, MD, <Dictator> Katha Hamming MD ELECTRONICALLY SIGNED  11/01/2014 22:08 

## 2014-10-30 NOTE — Consult Note (Signed)
PATIENT NAME:  Andrea Holden, Andrea Holden MR#:  [id number] DATE OF BIRTH:  12/09/1945  DATE OF CONSULTATION:  10/11/2014  REFERRING PHYSICIAN:  Katha HammingSnehalatha Konidena, MD CONSULTING PHYSICIAN:  Dow AdolphMatthew Elany Felix, MD  REASON FOR CONSULTATION:  Melena, severe anemia.    HISTORY OF PRESENT ILLNESS:  Andrea Holden is a 69 year old female with a past medical history notable for gastric bypass, COPD, neuropathy, who presents to the Emergency Room for evaluation of sweating and dizziness along with several black stools.  In the setting of working this up, it was noted that she has a hemoglobin of 5.0.  Andrea Holden denies ever having any previous problems with anemia.  She also denies any previous trouble with black or tarry stools.  She does take some occasional ibuprofen.   Of note, Andrea Holden was [facility] in early March and was found to have a small bowel obstruction.  She did go for laparoscopy with lysis of adhesions and resolution of her small bowel obstruction.  She is unaware of the details from this hospitalization but based on the records, it does not appear that she had an upper endoscopy at that time.  She thinks her last upper endoscopy was prior to her bypass surgery about 3 years ago.   PAST MEDICAL HISTORY:  1.  Neuropathy.  2.  COPD.   3.  Depression.  4.  Hypertension.   ALLERGIES:  TAPE.    SOCIAL HISTORY:  She is an ongoing smoker.  She denies alcohol.   PAST SURGICAL HISTORY:  She had the bypass 3 years ago.  She had a recent small bowel obstruction with lysis of adhesions 1 month ago.   FAMILY HISTORY:  No family history of GI malignancy that she is aware of.   HOME MEDICATIONS:   1.  Lisinopril 10 mg daily.  2.  Neurontin 100 mg t.i.d.  3.  Zoloft 100 mg daily.  4.  Metoprolol 25 daily.  5.  Furosemide 40 daily.  6.  Chantix 1 tab b.i.d.   REVIEW OF SYSTEMS:  A 10-system review was conducted, was negative except as stated in the HPI.    PHYSICAL EXAMINATION:   VITAL SIGNS:  Temperature 98.2, pulse is 87, respirations 18, blood pressure 113/71, pulse oximetry 100%.   GENERAL: Alert and oriented x 4.  No acute distress. Appears stated age. HEENT:  Normocephalic/atraumatic. Extraocular movements are intact. Anicteric.  Positive for conjunctival pallor.   NECK:  Soft, supple. JVP appears normal. No adenopathy. CHEST:  Clear to auscultation.  No wheeze or crackle. Respirations unlabored. HEART:  Regular. No murmur, rub, or gallop.  Normal S1 and S2. ABDOMEN:  Positive for mild abdominal adiposity, mild diffuse tenderness to palpation, normal active bowel sounds, soft, no rebound or guarding.   EXTREMITIES: No swelling, well perfused. SKIN: No rash or lesion. Skin color, texture, turgor normal. NEUROLOGICAL: Grossly intact. PSYCHIATRIC: Normal tone and affect. MUSCULOSKELETAL: No joint swelling or erythema.   LABORATORY DATA:  Sodium 134, potassium 4.1, BUN 42, creatinine 0.83.  Liver enzymes normal, albumin 3.1.  Troponin normal.  White count 14, hemoglobin 5.5, hematocrit is 17.  Her platelets are 427,000. TSH is normal.   ASSESSMENT AND PLAN:  Severe anemia, likely, melena:  I do suspect she has bled likely from an upper source.  This may be an anastomotic ulcer versus a peptic ulcer.  She does have nonsteroidal anti-inflammatory drug use and previous surgery along with smoking as potential risk factors.   RECOMMENDATIONS:  1.  Protonix drip or IV q.12.  2.  Follow hemoglobin and hemodynamics closely.  3.  Transfuse to hemoglobin greater than 7.   4.  N.p.o. after midnight.  5.  Upper endoscopy in the a.m. to look for possible source of bleeding, treat, risk stratify.   6.  If upper endoscopy is nondiagnostic, will need colonoscopy to continue to look for source of severe anemia.   Thank you for this consult.    ____________________________ Dow Adolph, MD mr:NT D: 10/11/2014 18:12:27 ET T: 10/11/2014 18:37:27  ET JOB#: [id number]  cc: Dow Adolph, MD, <Dictator> Kathalene Frames MD ELECTRONICALLY SIGNED 10/26/2014 17:56

## 2014-10-30 NOTE — Consult Note (Signed)
Details:   - GI Note:  see dictated note.   1.) Severe anemia, melena: she [facility] not [facility] as [facility] in h and p.  - plan for EGD in am  - npo after m.n. - transfuse to Hgb > 7  - IV protonix - monitor Hgb.  - colon to be sched if EGD negative.   Electronic Signatures: Dow Adolphein, Camille Thau (MD)  (Signed 12-Apr-16 18:18)  Authored: Details   Last Updated: 12-Apr-16 18:18 by Dow Adolphein, Evelena Masci (MD)

## 2014-11-07 DIAGNOSIS — K283 Acute gastrojejunal ulcer without hemorrhage or perforation: Secondary | ICD-10-CM | POA: Diagnosis not present

## 2014-11-07 DIAGNOSIS — K921 Melena: Secondary | ICD-10-CM | POA: Diagnosis not present

## 2014-11-07 DIAGNOSIS — K922 Gastrointestinal hemorrhage, unspecified: Secondary | ICD-10-CM | POA: Diagnosis not present

## 2014-11-12 DIAGNOSIS — K922 Gastrointestinal hemorrhage, unspecified: Secondary | ICD-10-CM | POA: Insufficient documentation

## 2014-11-12 DIAGNOSIS — K219 Gastro-esophageal reflux disease without esophagitis: Secondary | ICD-10-CM | POA: Insufficient documentation

## 2014-11-12 DIAGNOSIS — Z8719 Personal history of other diseases of the digestive system: Secondary | ICD-10-CM | POA: Insufficient documentation

## 2014-12-14 ENCOUNTER — Encounter: Payer: Self-pay | Admitting: Family Medicine

## 2014-12-14 ENCOUNTER — Ambulatory Visit (INDEPENDENT_AMBULATORY_CARE_PROVIDER_SITE_OTHER): Payer: Medicare Other | Admitting: Family Medicine

## 2014-12-14 VITALS — BP 140/68 | HR 77 | Temp 97.9°F | Resp 18 | Ht 63.0 in | Wt 162.9 lb

## 2014-12-14 DIAGNOSIS — Z720 Tobacco use: Secondary | ICD-10-CM | POA: Diagnosis not present

## 2014-12-14 DIAGNOSIS — I10 Essential (primary) hypertension: Secondary | ICD-10-CM | POA: Diagnosis not present

## 2014-12-14 DIAGNOSIS — E119 Type 2 diabetes mellitus without complications: Secondary | ICD-10-CM | POA: Diagnosis not present

## 2014-12-14 DIAGNOSIS — F418 Other specified anxiety disorders: Secondary | ICD-10-CM

## 2014-12-14 DIAGNOSIS — Z716 Tobacco abuse counseling: Secondary | ICD-10-CM | POA: Diagnosis not present

## 2014-12-14 DIAGNOSIS — E669 Obesity, unspecified: Secondary | ICD-10-CM

## 2014-12-14 DIAGNOSIS — K219 Gastro-esophageal reflux disease without esophagitis: Secondary | ICD-10-CM

## 2014-12-14 DIAGNOSIS — M5416 Radiculopathy, lumbar region: Secondary | ICD-10-CM

## 2014-12-14 DIAGNOSIS — G8929 Other chronic pain: Secondary | ICD-10-CM | POA: Insufficient documentation

## 2014-12-14 DIAGNOSIS — E785 Hyperlipidemia, unspecified: Secondary | ICD-10-CM | POA: Diagnosis not present

## 2014-12-14 DIAGNOSIS — Z9884 Bariatric surgery status: Secondary | ICD-10-CM | POA: Diagnosis not present

## 2014-12-14 DIAGNOSIS — J449 Chronic obstructive pulmonary disease, unspecified: Secondary | ICD-10-CM | POA: Diagnosis not present

## 2014-12-14 DIAGNOSIS — Z9289 Personal history of other medical treatment: Secondary | ICD-10-CM | POA: Insufficient documentation

## 2014-12-14 DIAGNOSIS — Z87891 Personal history of nicotine dependence: Secondary | ICD-10-CM | POA: Insufficient documentation

## 2014-12-14 DIAGNOSIS — F17210 Nicotine dependence, cigarettes, uncomplicated: Secondary | ICD-10-CM

## 2014-12-14 MED ORDER — BUPROPION HCL ER (SR) 150 MG PO TB12: 150.0000 mg | ORAL_TABLET | Freq: Two times a day (BID) | ORAL | Status: DC

## 2014-12-14 MED ORDER — GABAPENTIN 100 MG PO CAPS: 200.0000 mg | ORAL_CAPSULE | Freq: Two times a day (BID) | ORAL | Status: DC

## 2014-12-14 MED ORDER — PANTOPRAZOLE SODIUM 40 MG PO TBEC: 40.0000 mg | DELAYED_RELEASE_TABLET | Freq: Every day | ORAL | Status: DC

## 2014-12-14 MED ORDER — HYDROCODONE-ACETAMINOPHEN 10-325 MG PO TABS: 1.0000 | ORAL_TABLET | Freq: Four times a day (QID) | ORAL | Status: DC | PRN

## 2014-12-14 MED ORDER — LISINOPRIL 10 MG PO TABS: 10.0000 mg | ORAL_TABLET | Freq: Every day | ORAL | Status: DC

## 2014-12-14 MED ORDER — LORCASERIN HCL 10 MG PO TABS: 1.0000 | ORAL_TABLET | Freq: Two times a day (BID) | ORAL | Status: DC

## 2014-12-14 MED ORDER — FUROSEMIDE 40 MG PO TABS: 40.0000 mg | ORAL_TABLET | Freq: Every day | ORAL | Status: DC

## 2014-12-14 MED ORDER — SERTRALINE HCL 100 MG PO TABS: 100.0000 mg | ORAL_TABLET | Freq: Every day | ORAL | Status: DC

## 2014-12-14 NOTE — Progress Notes (Signed)
Name: Andrea Holden   MRN: [id number]    DOB: 05-05-1946   Date:12/14/2014       Progress Note  Subjective  Chief Complaint  Chief Complaint  Patient presents with  . Establish Care  . Medication Refill    patient uses [employer]. She will do 90 day supply of some.    HPI  Patient is new to the practice, here to establish care and here for routine follow up of Hypertension. First diagnosed with hypertension several years ago. Current anti-hypertension medication regimen includes dietary modification, weight management and Lisinopril 10mg , Metoprolol Tartate 25mg  po bid, Lasix 40mg  one a day. Patient is following physician recommended management. Associated symptoms do not include (headache, dizziness, nausea, lower extremity swelling, shortness of breath, chest pain, numbness).  Associated medication conditions include uncontrolled diabetes, cholesterol, smoker, frequent hospitalizations, cigarette smoker, COPD.   COPD: Patient complains of cough, fatigue and weight increase. Symptoms began several months ago. Symptoms inability to climb stairs and non-productive cough does worsen with exertion. Sputum is yellow in small amounts. Fever has been none. Patient uses 2 pillows at night. . Patient currently is not on home oxygen therapy.Marland Kitchen Respiratory history: COPD mild. Using rescue inhaler only once a month or less. No inhalers.   Joint/Muscle Pain: Patient complains of arthralgias for which has been present for several years. Pain is located in Hips, lower back, is described as aching, and is constant, mild to moderate.  Associated symptoms include: none.  The patient has using gabapentin daily and vicodin sparingly.  Related to injury:   no.  Needs refills of all medications.   Past Medical History  Diagnosis Date  . Diabetes mellitus   . Hypertension   . Allergy     seasonal allergies  . Asthma     allergy induced asthma  . Anemia   . Arthritis   . Anxiety   . Cataract   .  Depression   . GERD (gastroesophageal reflux disease)   . Irregular heartbeat   . Degenerative disc disease, lumbar   . Clotting disorder     history of blood clots  . Hyperlipidemia   . H/O eating disorder   . Degenerative disc disease, lumbar   . History of chicken pox   . History of measles, mumps, or rubella   . H/O transfusion     patient was given 5 units of blood while at [facility], blood type O+  . Moderate COPD (chronic obstructive pulmonary disease) 12/14/2014    Past Surgical History  Procedure Laterality Date  . Ovary surgery    . Tonsillectomy    . Abdominal hysterectomy    . Gallbladder surgery    . Mouth surgery    . Internal bleeding  2016  . Gastric bypass  2010    Family History  Problem Relation Age of Onset  . Asthma Mother   . COPD Mother   . Arthritis Father   . Depression Father   . Heart disease Father   . Hypertension Father   . Stroke Father   . Arthritis Brother   . Depression Brother   . Diabetes Brother   . Heart disease Brother   . Hyperlipidemia Brother   . Hypertension Brother   . Stroke Brother   . Vision loss Brother   . Diabetes Maternal Grandmother     History   Social History  . Marital Status: Divorced    Spouse Name: N/A  . Number of Children: N/A  .  Years of Education: N/A   Occupational History  . Substitute Teacher     [employer]   Social History Main Topics  . Smoking status: Current Some Day Smoker    Types: Cigarettes    Last Attempt to Quit: 10/03/2007  . Smokeless tobacco: Never Used  . Alcohol Use: 0.0 oz/week    0 Standard drinks or equivalent per week     Comment: OCCASIONALLY  . Drug Use: No  . Sexual Activity: No   Other Topics Concern  . Not on file   Social History Narrative     Current outpatient prescriptions:  .  aspirin EC 81 MG tablet, Take 81 mg by mouth daily., Disp: , Rfl:  .  furosemide (LASIX) 40 MG tablet, Take 40 mg by mouth daily., Disp: , Rfl:   .  gabapentin (NEURONTIN) 100 MG capsule, , Disp: , Rfl:  .  lisinopril (PRINIVIL,ZESTRIL) 10 MG tablet, , Disp: , Rfl:  .  metoprolol tartrate (LOPRESSOR) 25 MG tablet, 25 mg., Disp: , Rfl:  .  pantoprazole (PROTONIX) 40 MG tablet, Take 40 mg by mouth daily., Disp: , Rfl:  .  sertraline (ZOLOFT) 100 MG tablet, , Disp: , Rfl:  .  tiZANidine (ZANAFLEX) 4 MG tablet, Take 4 mg by mouth every 6 (six) hours as needed for muscle spasms., Disp: , Rfl:  .  varenicline (CHANTIX) 1 MG tablet, Take 1 mg by mouth 2 (two) times daily., Disp: , Rfl:  .  Vitamin D, Ergocalciferol, (DRISDOL) 50000 UNITS CAPS capsule, , Disp: , Rfl:  .  etodolac (LODINE) 500 MG tablet, , Disp: , Rfl:  .  metFORMIN (GLUCOPHAGE-XR) 500 MG 24 hr tablet, 500 mg., Disp: , Rfl:   No Known Allergies   ROS  CONSTITUTIONAL: No significant weight changes, fever, chills, weakness or fatigue. HEENT:  - Eyes: No visual changes.  - Ears: No auditory changes. No pain.  - Nose: No sneezing, congestion, runny nose. - Throat: No sore throat. No changes in swallowing. SKIN: No rash or itching.  CARDIOVASCULAR: No chest pain, chest pressure or chest discomfort. No palpitations or edema.  RESPIRATORY: No shortness of breath, cough or sputum.  GASTROINTESTINAL: No anorexia, nausea, vomiting. No changes in bowel habits. No abdominal pain or blood.  GENITOURINARY: No dysuria. No frequency. No discharge.  NEUROLOGICAL: No headache, dizziness, syncope, paralysis, ataxia, numbness or tingling in the extremities. No memory changes. No change in bowel or bladder control.  MUSCULOSKELETAL: No joint pain. No muscle pain. HEMATOLOGIC: No anemia, bleeding or bruising.  LYMPHATICS: No enlarged lymph nodes.  PSYCHIATRIC: No change in mood. No change in sleep pattern.  ENDOCRINOLOGIC: No reports of sweating, cold or heat intolerance. No polyuria or polydipsia.   Objective  Filed Vitals:   12/14/14 1354  BP: 140/68  Pulse: 77  Temp: 97.9 F  (36.6 C)  TempSrc: Oral  Resp: 18  Height:  (1.6 m)  Weight: 162 lb 14.4 oz (73.891 kg)  SpO2: 96%    Physical Exam  Constitutional: Patient appears well-developed and well-nourished. Walking with cane. In no distress.  Smells of cigarette smoke. HEENT:  - Head: Normocephalic and atraumatic.  - Ears: Bilateral TMs gray, no erythema or effusion - Nose: Nasal mucosa moist - Mouth/Throat: Oropharynx is clear and moist. No tonsillar hypertrophy or erythema. No post nasal drainage.  - Eyes: Conjunctivae clear, EOM movements normal. PERRLA. No scleral icterus.  Neck: Normal range of motion. Neck supple. No JVD present.  No thyromegaly present.  Cardiovascular: Normal rate, regular rhythm and normal heart sounds.  No murmur heard.  Pulmonary/Chest: Effort normal and breath sounds normal. No respiratory distress. Musculoskeletal: Normal range of motion bilateral UE and LE, no joint effusions. Peripheral vascular: Bilateral LE no edema. Neurological: CN II-XII grossly intact with no focal deficits. Alert and oriented to person, place, and time. Coordination, balance, strength, speech and gait with cane are normal.  Skin: Skin is warm and dry. No rash noted. No erythema.  Psychiatric: Patient has a normal mood and affect. Behavior is normal in office today. Judgment and thought content normal in office today.   Assessment & Plan  1. Diabetes mellitus type II, controlled, with no complications Reviewed hospital notes and labs. Currently diet controled DM II with no obvious complications.  2. Hypertension goal BP (blood pressure) < 150/90 Well controled.  - lisinopril (PRINIVIL,ZESTRIL) 10 MG tablet; Take 1 tablet (10 mg total) by mouth daily.  Dispense: 90 tablet; Refill: 2 - furosemide (LASIX) 40 MG tablet; Take 1 tablet (40 mg total) by mouth daily.  Dispense: 90 tablet; Refill: 2  3. Hyperlipidemia LDL goal <100 Not on a statin currently. Will recheck labs 01/2015.  4. History of  transfusion of packed RBC Recheck h/h 01/2015. Clinicaly stable.   5. COPD, mild Stable with prn albuterol inhaler use.   6. Cigarette smoker Discontinue chantix, start bupropion.  - buPROPion (WELLBUTRIN SR) 150 MG 12 hr tablet; Take 1 tablet (150 mg total) by mouth 2 (two) times daily.  Dispense: 180 tablet; Refill: 0  7. Status post bariatric surgery Check vitamins 01/2015.  8. Obesity, Class I, BMI 30-34.9 Using Belviq with no side effects.  - Lorcaserin HCl (BELVIQ) 10 MG TABS; Take 1 tablet by mouth 2 (two) times daily.  Dispense: 60 tablet; Refill: 2  9. Chronic radicular lumbar pain Refilled.  - HYDROcodone-acetaminophen (NORCO) 10-325 MG per tablet; Take 1 tablet by mouth every 6 (six) hours as needed for severe pain.  Dispense: 60 tablet; Refill: 0 - gabapentin (NEURONTIN) 100 MG capsule; Take 2 capsules (200 mg total) by mouth 2 (two) times daily.  Dispense: 360 capsule; Refill: 2  10. Encounter for smoking cessation counseling The patient has been counseled on smoking cessation benefits, goals, strategies and available over the counter and prescription medications that may help them in their efforts.  Options discussed include Nicoderm patches, Wellbutrin and Chantix.  The patient voices understanding their increased risk of cardiovascular and pulmonary diseases with continued use of tobacco products.  - buPROPion (WELLBUTRIN SR) 150 MG 12 hr tablet; Take 1 tablet (150 mg total) by mouth 2 (two) times daily.  Dispense: 180 tablet; Refill: 0  11. Gastroesophageal reflux disease without esophagitis Refilled  - pantoprazole (PROTONIX) 40 MG tablet; Take 1 tablet (40 mg total) by mouth daily.  Dispense: 90 tablet; Refill: 2  12. Depression with anxiety Refilled  - sertraline (ZOLOFT) 100 MG tablet; Take 1 tablet (100 mg total) by mouth daily.  Dispense: 90 tablet; Refill: 2

## 2014-12-21 ENCOUNTER — Other Ambulatory Visit: Payer: Self-pay

## 2014-12-21 ENCOUNTER — Telehealth: Payer: Self-pay | Admitting: Family Medicine

## 2014-12-21 ENCOUNTER — Other Ambulatory Visit: Payer: Self-pay | Admitting: Family Medicine

## 2014-12-21 DIAGNOSIS — I10 Essential (primary) hypertension: Secondary | ICD-10-CM

## 2014-12-21 DIAGNOSIS — E669 Obesity, unspecified: Secondary | ICD-10-CM

## 2014-12-21 MED ORDER — METOPROLOL SUCCINATE ER 25 MG PO TB24: 25.0000 mg | ORAL_TABLET | Freq: Every day | ORAL | Status: DC

## 2014-12-21 MED ORDER — LORCASERIN HCL 10 MG PO TABS: 1.0000 | ORAL_TABLET | Freq: Two times a day (BID) | ORAL | Status: DC

## 2014-12-21 MED ORDER — LORCASERIN HCL 10 MG PO TABS
1.0000 | ORAL_TABLET | Freq: Two times a day (BID) | ORAL | Status: DC
Start: 2014-12-21 — End: 2015-03-16

## 2014-12-21 NOTE — Telephone Encounter (Signed)
Patient says that the Belviq and Metoprolol ER was not sent to the pharmacy. Please send to [facility] [phone]

## 2014-12-21 NOTE — Telephone Encounter (Signed)
Got a message for patient requesting meds (Belviq & Metoprolol ER) to be sent to [facility] [address]

## 2014-12-27 ENCOUNTER — Telehealth: Payer: Self-pay | Admitting: Family Medicine

## 2014-12-27 DIAGNOSIS — E669 Obesity, unspecified: Secondary | ICD-10-CM

## 2014-12-27 NOTE — Telephone Encounter (Signed)
Please call in Belviq  one tablet by mouth twice a day, quantity #60 for a 30 day supply with 2 refills. [facility] [address]. EPIC EMR will not let me fax or E-scribe this RX as it is registering as a controlled substance.

## 2014-12-27 NOTE — Telephone Encounter (Signed)
rx was called in (via voicemail) to [facility] on [address].

## 2014-12-27 NOTE — Telephone Encounter (Signed)
Belviq was sent to the mail order costing $800.00, but MD to needs send it to the [facility] on [address]. Office needs to provide prior authorization on medicine per pt.

## 2015-02-16 ENCOUNTER — Telehealth: Payer: Self-pay | Admitting: Family Medicine

## 2015-02-16 NOTE — Telephone Encounter (Signed)
Requesting a referral for her back. Please fax it to Dr Dutch Quint (Neurologist in [place]) [phone] ATTN: Judeth Cornfield

## 2015-02-18 ENCOUNTER — Other Ambulatory Visit: Payer: Self-pay | Admitting: Family Medicine

## 2015-02-18 DIAGNOSIS — G8929 Other chronic pain: Secondary | ICD-10-CM

## 2015-02-18 DIAGNOSIS — M5416 Radiculopathy, lumbar region: Principal | ICD-10-CM

## 2015-02-18 NOTE — Telephone Encounter (Signed)
Referral placed.

## 2015-02-23 ENCOUNTER — Telehealth: Payer: Self-pay

## 2015-02-23 NOTE — Telephone Encounter (Signed)
Notified patient of her appointment at [facility]

## 2015-03-02 DIAGNOSIS — I10 Essential (primary) hypertension: Secondary | ICD-10-CM | POA: Diagnosis not present

## 2015-03-02 DIAGNOSIS — M4806 Spinal stenosis, lumbar region: Secondary | ICD-10-CM | POA: Diagnosis not present

## 2015-03-13 ENCOUNTER — Other Ambulatory Visit: Payer: Self-pay | Admitting: Neurosurgery

## 2015-03-13 DIAGNOSIS — M48061 Spinal stenosis, lumbar region without neurogenic claudication: Secondary | ICD-10-CM

## 2015-03-16 ENCOUNTER — Encounter: Payer: Self-pay | Admitting: Family Medicine

## 2015-03-16 ENCOUNTER — Ambulatory Visit (INDEPENDENT_AMBULATORY_CARE_PROVIDER_SITE_OTHER): Payer: Medicare Other | Admitting: Family Medicine

## 2015-03-16 VITALS — BP 130/78 | HR 68 | Temp 97.2°F | Resp 14 | Ht 63.0 in | Wt 183.2 lb

## 2015-03-16 DIAGNOSIS — Z23 Encounter for immunization: Secondary | ICD-10-CM

## 2015-03-16 DIAGNOSIS — G8929 Other chronic pain: Secondary | ICD-10-CM

## 2015-03-16 DIAGNOSIS — F17210 Nicotine dependence, cigarettes, uncomplicated: Secondary | ICD-10-CM

## 2015-03-16 DIAGNOSIS — H6121 Impacted cerumen, right ear: Secondary | ICD-10-CM | POA: Diagnosis not present

## 2015-03-16 DIAGNOSIS — E785 Hyperlipidemia, unspecified: Secondary | ICD-10-CM

## 2015-03-16 DIAGNOSIS — E119 Type 2 diabetes mellitus without complications: Secondary | ICD-10-CM

## 2015-03-16 DIAGNOSIS — I10 Essential (primary) hypertension: Secondary | ICD-10-CM | POA: Diagnosis not present

## 2015-03-16 DIAGNOSIS — H6120 Impacted cerumen, unspecified ear: Secondary | ICD-10-CM | POA: Insufficient documentation

## 2015-03-16 DIAGNOSIS — M5416 Radiculopathy, lumbar region: Secondary | ICD-10-CM | POA: Diagnosis not present

## 2015-03-16 DIAGNOSIS — Z720 Tobacco use: Secondary | ICD-10-CM

## 2015-03-16 MED ORDER — NICOTINE 21 MG/24HR TD PT24: 21.0000 mg | MEDICATED_PATCH | Freq: Every day | TRANSDERMAL | Status: DC

## 2015-03-16 MED ORDER — HYDROCODONE-ACETAMINOPHEN 10-325 MG PO TABS: 1.0000 | ORAL_TABLET | Freq: Two times a day (BID) | ORAL | Status: DC | PRN

## 2015-03-16 NOTE — Progress Notes (Signed)
Name: Andrea Holden   MRN: [id number]    DOB: February 21, 1946   Date:03/16/2015       Progress Note  Subjective  Chief Complaint  Chief Complaint  Patient presents with  . Medication Refill  . Cerumen Impaction    patient was told by a nurse that the R ear was blocked     HPI  Andrea Holden is a 69 year old female with known history of HTN, HLD, DM II, COPD, chronic lumbar back pain, history of bariatric surgery here for routine follow up of chronic medical conditions. Working with Neurosurgical spine specialist Dr. Julio Sicks regarding her chronic lumbar back pain. Planned for MRI imaging tomorrow. She continues to complain of low back pain. Patient complains of arthralgias for which has been present for several years. Pain is located in Hips, lower back, is described as aching, and is constant, mild to moderate. Associated symptoms do not include saddle paresthesia, loss of involuntary bowel or bladder, focal neurological deficit. The patient has using gabapentin daily and vicodin daily and needs a refill. Related to injury:not that she can recall.  In addition she is here for HTN, HLD, DMII follow up. First diagnosed with hypertension several years ago. Current anti-hypertension medication regimen includes dietary modification, weight management and Lisinopril 10mg , Metoprolol Tartate 25mg  po bid, Lasix 40mg  one a day. Patient is following physician recommended management. Associated symptoms do not include headache, dizziness, nausea, lower extremity swelling, shortness of breath, chest pain, numbness. She is not on any medication therapy currently for DM II but reports prior to her bariatric surgery she even needed insulin therapy. Recently she is putting on weight and craving sweets. Not on any statin therapy either. She is interested in smoking cessation. In the past Chantix has helped but she can not afford the co pay with her insurance now. Wellbutrin is not helping as much with  smoking cessation. She is interested in adding on the patches.   Active Ambulatory Problems    Diagnosis Date Noted  . History of GI bleed 11/12/2014  . History of small bowel obstruction 09/07/2014  . Acute gastrojejunal ulcer 11/12/2014  . Diabetes mellitus type II, controlled, with no complications 12/14/2014  . Hypertension goal BP (blood pressure) < 140/90 12/14/2014  . Hyperlipidemia LDL goal <100 12/14/2014  . History of transfusion of packed RBC 12/14/2014  . COPD, mild 12/14/2014  . Cigarette smoker 12/14/2014  . Status post bariatric surgery 12/14/2014  . Obesity, Class I, BMI 30-34.9 12/14/2014  . Chronic radicular lumbar pain 12/14/2014  . Need for immunization against influenza 03/16/2015  . Need for pneumococcal vaccination 03/16/2015  . Cerumen impaction 03/16/2015   Resolved Ambulatory Problems    Diagnosis Date Noted  . No Resolved Ambulatory Problems   Past Medical History  Diagnosis Date  . Diabetes mellitus   . Hypertension   . Allergy   . Asthma   . Anemia   . Arthritis   . Anxiety   . Cataract   . Depression   . GERD (gastroesophageal reflux disease)   . Irregular heartbeat   . Degenerative disc disease, lumbar   . Clotting disorder   . Hyperlipidemia   . H/O eating disorder   . Degenerative disc disease, lumbar   . History of chicken pox   . History of measles, mumps, or rubella   . H/O transfusion   . Moderate COPD (chronic obstructive pulmonary disease) 12/14/2014      Social History  Substance Use Topics  .  Smoking status: Current Some Day Smoker    Types: Cigarettes    Last Attempt to Quit: 10/03/2007  . Smokeless tobacco: Never Used  . Alcohol Use: 0.0 oz/week    0 Standard drinks or equivalent per week     Comment: OCCASIONALLY     Current outpatient prescriptions:  .  aspirin EC 81 MG tablet, Take 81 mg by mouth daily., Disp: , Rfl:  .  buPROPion (WELLBUTRIN SR) 150 MG 12 hr tablet, Take 1 tablet (150 mg total) by mouth 2  (two) times daily., Disp: 180 tablet, Rfl: 0 .  furosemide (LASIX) 40 MG tablet, Take 1 tablet (40 mg total) by mouth daily., Disp: 90 tablet, Rfl: 2 .  gabapentin (NEURONTIN) 100 MG capsule, Take 2 capsules (200 mg total) by mouth 2 (two) times daily., Disp: 360 capsule, Rfl: 2 .  HYDROcodone-acetaminophen (NORCO) 10-325 MG per tablet, Take 1 tablet by mouth every 6 (six) hours as needed for severe pain., Disp: 60 tablet, Rfl: 0 .  lisinopril (PRINIVIL,ZESTRIL) 10 MG tablet, Take 1 tablet (10 mg total) by mouth daily., Disp: 90 tablet, Rfl: 2 .  metoprolol succinate (TOPROL-XL) 25 MG 24 hr tablet, Take 1 tablet (25 mg total) by mouth daily., Disp: 90 tablet, Rfl: 1 .  pantoprazole (PROTONIX) 40 MG tablet, Take 1 tablet (40 mg total) by mouth daily., Disp: 90 tablet, Rfl: 2 .  sertraline (ZOLOFT) 100 MG tablet, Take 1 tablet (100 mg total) by mouth daily., Disp: 90 tablet, Rfl: 2 .  Vitamin D, Ergocalciferol, (DRISDOL) 50000 UNITS CAPS capsule, , Disp: , Rfl:   Past Surgical History  Procedure Laterality Date  . Ovary surgery    . Tonsillectomy    . Abdominal hysterectomy    . Gallbladder surgery    . Mouth surgery    . Internal bleeding  2016  . Gastric bypass  2010    Family History  Problem Relation Age of Onset  . Asthma Mother   . COPD Mother   . Arthritis Father   . Depression Father   . Heart disease Father   . Hypertension Father   . Stroke Father   . Arthritis Brother   . Depression Brother   . Diabetes Brother   . Heart disease Brother   . Hyperlipidemia Brother   . Hypertension Brother   . Stroke Brother   . Vision loss Brother   . Diabetes Maternal Grandmother     Allergies  Allergen Reactions  . Sitagliptin Hives and Itching     Review of Systems  CONSTITUTIONAL: Yes weight increased. No fever, chills, weakness. Yes fatigue.  HEENT:  - Eyes: No visual changes.  - Ears: No auditory changes. No pain.  - Nose: No sneezing, congestion, runny nose. -  Throat: No sore throat. No changes in swallowing. SKIN: No rash or itching.  CARDIOVASCULAR: No chest pain, chest pressure or chest discomfort. No palpitations or edema.  RESPIRATORY: No shortness of breath, cough or sputum.  GASTROINTESTINAL: No anorexia, nausea, vomiting. No changes in bowel habits. No abdominal pain or blood.  GENITOURINARY: No dysuria. No frequency. No discharge.  NEUROLOGICAL: No headache, dizziness, syncope, paralysis, ataxia, numbness or tingling in the extremities. No memory changes. No change in bowel or bladder control.  MUSCULOSKELETAL: Yes chronic joint pain. No muscle pain. HEMATOLOGIC: No anemia, bleeding or bruising.  LYMPHATICS: No enlarged lymph nodes.  PSYCHIATRIC: No change in mood. No change in sleep pattern.  ENDOCRINOLOGIC: No reports of sweating, cold or heat  intolerance. No polyuria or polydipsia. Yes sugar cravings.     Objective  BP 130/78 mmHg  Pulse 68  Temp(Src) 97.2 F (36.2 C) (Oral)  Resp 14  Ht 5\' 3"  (1.6 m)  Wt 183 lb 3.2 oz (83.099 kg)  BMI 32.46 kg/m2  SpO2 94% Body mass index is 32.46 kg/(m^2).  Physical Exam  Constitutional: Patient is obese and well-nourished. In no distress.  HEENT:  - Head: Normocephalic and atraumatic.  - Ears: Bilateral TMs gray, no erythema or effusion although right TM only visualized after Cerumen impaction removed with irrigation. Right TM scarred and sclerosed.  - Nose: Nasal mucosa moist - Mouth/Throat: Oropharynx is clear and moist. No tonsillar hypertrophy or erythema. No post nasal drainage.  - Eyes: Conjunctivae clear, EOM movements normal. PERRLA. No scleral icterus.  Neck: Normal range of motion. Neck supple. No JVD present. No thyromegaly present.  Cardiovascular: Normal rate, regular rhythm and normal heart sounds.  No murmur heard.  Pulmonary/Chest: Effort normal and breath sounds normal. No respiratory distress. Musculoskeletal: Normal range of motion bilateral UE and LE, no joint  effusions. Lumbar spine with no palpable spinal step off. Present paraspinal muscle spasm. Positive straight leg testing on the left.  Peripheral vascular: Bilateral LE no edema. Neurological: CN II-XII grossly intact with no focal deficits. Alert and oriented to person, place, and time. Coordination, balance, strength, speech and gait are normal.  Skin: Skin is warm and dry. No rash noted. No erythema.  Psychiatric: Patient has a normal mood and affect. Behavior is normal in office today. Judgment and thought content normal in office today.   Assessment & Plan  1. Hypertension goal BP (blood pressure) < 140/90 Clinically stable findings based on clinical exam and on review of any pertinent results. Recommended to patient that they continue their current regimen with regular follow ups.  - CBC with Differential/Platelet - Comprehensive metabolic panel - TSH  2. Type 2 diabetes, diet controlled Concerning symptoms may reflect poor glycemic control as she is not on medication therapy and is gaining weight. Will get Hba1c and restart newer oral agent or insulin therapy depending on result. She prefers to avoid metformin as it has caused GI upset in the past.   - Hemoglobin A1c - TSH  3. Hyperlipidemia LDL goal <100 Increased risk factors for HLD include DM II, HTN. She is not on statin therapy and I will encourage her to consider starting if her lipid panel is elevated.  - Lipid panel - TSH  4. Cigarette smoker In combination with DM II, HTN, HLD she likely has atherosclerotic disease in her coronary arteries if not also in other vessels. The patient has been counseled on smoking cessation benefits, goals, strategies and available over the counter and prescription medications that may help them in their efforts.  Options discussed include Nicoderm patches, Wellbutrin and Chantix.  The patient voices understanding their increased risk of cardiovascular and pulmonary diseases with continued  use of tobacco products.  Currently smoking 1ppd.   - nicotine (NICODERM CQ - DOSED IN MG/24 HOURS) 21 mg/24hr patch; Place 1 patch (21 mg total) onto the skin daily.  Dispense: 28 patch; Refill: 1  5. Chronic radicular lumbar pain Worsening pain, plan to get MRI imaging tomorrow and consider therapy based on findings.  - HYDROcodone-acetaminophen (NORCO) 10-325 MG per tablet; Take 1 tablet by mouth 2 (two) times daily as needed for severe pain.  Dispense: 60 tablet; Refill: 0  6. Cerumen impaction, right Right ear cleaned  with irrigation therapy.   7. Need for immunization against influenza  - Flu vaccine HIGH DOSE PF  8. Need for pneumococcal vaccination  - Pneumococcal conjugate vaccine 13-valent IM

## 2015-03-17 ENCOUNTER — Ambulatory Visit
Admission: RE | Admit: 2015-03-17 | Discharge: 2015-03-17 | Disposition: A | Discharge status: Home / Self Care | Payer: Medicare Other | Source: Ambulatory Visit | Attending: Neurosurgery | Admitting: Neurosurgery

## 2015-03-17 DIAGNOSIS — M4316 Spondylolisthesis, lumbar region: Secondary | ICD-10-CM | POA: Diagnosis not present

## 2015-03-17 DIAGNOSIS — M4806 Spinal stenosis, lumbar region: Secondary | ICD-10-CM | POA: Insufficient documentation

## 2015-03-17 DIAGNOSIS — M48061 Spinal stenosis, lumbar region without neurogenic claudication: Secondary | ICD-10-CM

## 2015-03-17 DIAGNOSIS — M5126 Other intervertebral disc displacement, lumbar region: Secondary | ICD-10-CM | POA: Diagnosis not present

## 2015-03-20 ENCOUNTER — Other Ambulatory Visit: Payer: Self-pay | Admitting: Family Medicine

## 2015-03-30 DIAGNOSIS — Z6833 Body mass index (BMI) 33.0-33.9, adult: Secondary | ICD-10-CM | POA: Diagnosis not present

## 2015-03-30 DIAGNOSIS — M4806 Spinal stenosis, lumbar region: Secondary | ICD-10-CM | POA: Diagnosis not present

## 2015-04-24 DIAGNOSIS — H2511 Age-related nuclear cataract, right eye: Secondary | ICD-10-CM | POA: Diagnosis not present

## 2015-04-28 DIAGNOSIS — M4806 Spinal stenosis, lumbar region: Secondary | ICD-10-CM | POA: Diagnosis not present

## 2015-04-28 DIAGNOSIS — M5416 Radiculopathy, lumbar region: Secondary | ICD-10-CM | POA: Diagnosis not present

## 2015-04-28 DIAGNOSIS — M5136 Other intervertebral disc degeneration, lumbar region: Secondary | ICD-10-CM | POA: Diagnosis not present

## 2015-05-11 ENCOUNTER — Other Ambulatory Visit: Payer: Self-pay | Admitting: Family Medicine

## 2015-05-11 ENCOUNTER — Other Ambulatory Visit: Payer: Self-pay

## 2015-05-11 DIAGNOSIS — F17210 Nicotine dependence, cigarettes, uncomplicated: Secondary | ICD-10-CM

## 2015-05-11 DIAGNOSIS — M5416 Radiculopathy, lumbar region: Secondary | ICD-10-CM

## 2015-05-11 DIAGNOSIS — G8929 Other chronic pain: Secondary | ICD-10-CM

## 2015-05-11 MED ORDER — HYDROCODONE-ACETAMINOPHEN 10-325 MG PO TABS: 1.0000 | ORAL_TABLET | Freq: Two times a day (BID) | ORAL | Status: DC | PRN

## 2015-05-11 MED ORDER — NICOTINE 14 MG/24HR TD PT24: 14.0000 mg | MEDICATED_PATCH | Freq: Every day | TRANSDERMAL | Status: DC

## 2015-05-11 NOTE — Telephone Encounter (Signed)
Refill request was sent to Dr. Alba CoryKrichna Sowles for approval and submission.  This patient is scheduled for tomorrow at 11:15am if an appt is needed in order to get these prescriptions.

## 2015-05-11 NOTE — Telephone Encounter (Signed)
Patient called stating that she needed the Chantix starter pack along with the Norco & nicotine patch that was refilled today by Dr. Alba CoryKrichna Sowles, but Dr. Carlynn PurlSowles said that she did not need both of the patch and Chantix. She was informed of this and asked if I could talk with Dr. Sherley BoundsSundaram since this was something that was discuss during her last visit. I said that I would send her a message.

## 2015-05-11 NOTE — Telephone Encounter (Signed)
PT NEEDS ORDER FOR LABS AND SHE ALSO NEEDS REFILL ON VICODIN, HYDROCODONE

## 2015-05-12 MED ORDER — VARENICLINE TARTRATE 0.5 MG X 11 & 1 MG X 42 PO MISC: ORAL | Status: DC

## 2015-05-15 ENCOUNTER — Other Ambulatory Visit: Payer: Self-pay

## 2015-05-15 MED ORDER — ALBUTEROL SULFATE HFA 108 (90 BASE) MCG/ACT IN AERS
1.0000 | INHALATION_SPRAY | Freq: Four times a day (QID) | RESPIRATORY_TRACT | Status: DC | PRN
Start: 2015-05-15 — End: 2015-07-04

## 2015-05-15 NOTE — Telephone Encounter (Signed)
Patient came in and ask if we could send the rx for Chantix to her local pharmacy instead of her mail order.   Called [facility] at [phone] and left a message on the prescriber's line as it was written by Dr. Sherley BoundsSundaram. I then called [facility] and cancelled their order for the Chantix, spoke with pharmacist Jacquenette Shone(Julian).  Patient then asked if we could refill her rx for pro-air. I did not see where we have ever prescribed that for her, so she was informed that she would have to get ehr pharmacy to fax a rx request to the physician who last wrote it or wait to see if Dr. Sherley BoundsSundaram will approve it.

## 2015-05-18 DIAGNOSIS — H2511 Age-related nuclear cataract, right eye: Secondary | ICD-10-CM | POA: Diagnosis not present

## 2015-05-22 ENCOUNTER — Encounter: Payer: Self-pay | Admitting: *Deleted

## 2015-05-30 ENCOUNTER — Ambulatory Visit: Payer: Medicare Other | Admitting: Anesthesiology

## 2015-05-30 ENCOUNTER — Encounter
Admission: RE | Disposition: A | Discharge status: Home / Self Care | Payer: Self-pay | Source: Ambulatory Visit | Attending: Ophthalmology

## 2015-05-30 ENCOUNTER — Ambulatory Visit
Admission: RE | Admit: 2015-05-30 | Discharge: 2015-05-30 | Disposition: A | Discharge status: Home / Self Care | Payer: Medicare Other | Source: Ambulatory Visit | Attending: Ophthalmology | Admitting: Ophthalmology

## 2015-05-30 ENCOUNTER — Encounter: Payer: Self-pay | Admitting: *Deleted

## 2015-05-30 DIAGNOSIS — Z9049 Acquired absence of other specified parts of digestive tract: Secondary | ICD-10-CM | POA: Insufficient documentation

## 2015-05-30 DIAGNOSIS — M1990 Unspecified osteoarthritis, unspecified site: Secondary | ICD-10-CM | POA: Diagnosis not present

## 2015-05-30 DIAGNOSIS — K219 Gastro-esophageal reflux disease without esophagitis: Secondary | ICD-10-CM | POA: Diagnosis not present

## 2015-05-30 DIAGNOSIS — E1140 Type 2 diabetes mellitus with diabetic neuropathy, unspecified: Secondary | ICD-10-CM | POA: Diagnosis not present

## 2015-05-30 DIAGNOSIS — Z8711 Personal history of peptic ulcer disease: Secondary | ICD-10-CM | POA: Diagnosis not present

## 2015-05-30 DIAGNOSIS — H269 Unspecified cataract: Secondary | ICD-10-CM | POA: Diagnosis present

## 2015-05-30 DIAGNOSIS — F329 Major depressive disorder, single episode, unspecified: Secondary | ICD-10-CM | POA: Insufficient documentation

## 2015-05-30 DIAGNOSIS — Z79891 Long term (current) use of opiate analgesic: Secondary | ICD-10-CM | POA: Diagnosis not present

## 2015-05-30 DIAGNOSIS — H9190 Unspecified hearing loss, unspecified ear: Secondary | ICD-10-CM | POA: Insufficient documentation

## 2015-05-30 DIAGNOSIS — I10 Essential (primary) hypertension: Secondary | ICD-10-CM | POA: Insufficient documentation

## 2015-05-30 DIAGNOSIS — J45909 Unspecified asthma, uncomplicated: Secondary | ICD-10-CM | POA: Diagnosis not present

## 2015-05-30 DIAGNOSIS — Z7982 Long term (current) use of aspirin: Secondary | ICD-10-CM | POA: Insufficient documentation

## 2015-05-30 DIAGNOSIS — I499 Cardiac arrhythmia, unspecified: Secondary | ICD-10-CM | POA: Insufficient documentation

## 2015-05-30 DIAGNOSIS — Z9884 Bariatric surgery status: Secondary | ICD-10-CM | POA: Diagnosis not present

## 2015-05-30 DIAGNOSIS — J449 Chronic obstructive pulmonary disease, unspecified: Secondary | ICD-10-CM | POA: Diagnosis not present

## 2015-05-30 DIAGNOSIS — F17200 Nicotine dependence, unspecified, uncomplicated: Secondary | ICD-10-CM | POA: Diagnosis not present

## 2015-05-30 DIAGNOSIS — Z79899 Other long term (current) drug therapy: Secondary | ICD-10-CM | POA: Diagnosis not present

## 2015-05-30 DIAGNOSIS — F419 Anxiety disorder, unspecified: Secondary | ICD-10-CM | POA: Insufficient documentation

## 2015-05-30 DIAGNOSIS — H2511 Age-related nuclear cataract, right eye: Secondary | ICD-10-CM | POA: Insufficient documentation

## 2015-05-30 HISTORY — PX: CATARACT EXTRACTION W/PHACO: SHX586

## 2015-05-30 HISTORY — DX: Cardiac arrhythmia, unspecified: I49.9

## 2015-05-30 HISTORY — DX: Wheezing: R06.2

## 2015-05-30 HISTORY — DX: Unspecified hearing loss, unspecified ear: H91.90

## 2015-05-30 HISTORY — DX: Adverse effect of unspecified anesthetic, initial encounter: T41.45XA

## 2015-05-30 HISTORY — DX: Polyneuropathy, unspecified: G62.9

## 2015-05-30 HISTORY — DX: Other complications of anesthesia, initial encounter: T88.59XA

## 2015-05-30 HISTORY — DX: Personal history of other diseases of the digestive system: Z87.19

## 2015-05-30 LAB — GLUCOSE, CAPILLARY: Glucose-Capillary: 118 mg/dL — ABNORMAL HIGH (ref 65–99)

## 2015-05-30 SURGERY — PHACOEMULSIFICATION, CATARACT, WITH IOL INSERTION
Anesthesia: Monitor Anesthesia Care | Site: Eye | Laterality: Right | Wound class: Clean

## 2015-05-30 MED ORDER — MIDAZOLAM HCL 2 MG/2ML IJ SOLN
INTRAMUSCULAR | Status: DC | PRN
  Administered 2015-05-30: 1 mg via INTRAVENOUS

## 2015-05-30 MED ORDER — CARBACHOL 0.01 % IO SOLN
INTRAOCULAR | Status: DC | PRN
Start: 2015-05-30 — End: 2015-05-30
  Administered 2015-05-30: .5 mL via INTRAOCULAR

## 2015-05-30 MED ORDER — ARMC OPHTHALMIC DILATING GEL
1.0000 "application " | OPHTHALMIC | Status: DC | PRN
  Administered 2015-05-30 (×2): 1 via OPHTHALMIC

## 2015-05-30 MED ORDER — CEFUROXIME OPHTHALMIC INJECTION 1 MG/0.1 ML
INJECTION | OPHTHALMIC | Status: DC | PRN
  Administered 2015-05-30: .1 mL via INTRACAMERAL

## 2015-05-30 MED ORDER — TETRACAINE HCL 0.5 % OP SOLN
1.0000 [drp] | OPHTHALMIC | Status: AC | PRN
  Administered 2015-05-30: 1 [drp] via OPHTHALMIC

## 2015-05-30 MED ORDER — EPINEPHRINE HCL 1 MG/ML IJ SOLN
INTRAMUSCULAR | Status: AC
  Filled 2015-05-30: qty 1

## 2015-05-30 MED ORDER — EPINEPHRINE HCL 1 MG/ML IJ SOLN
INTRAOCULAR | Status: DC | PRN
  Administered 2015-05-30: 1 mL via OPHTHALMIC

## 2015-05-30 MED ORDER — NA CHONDROIT SULF-NA HYALURON 40-17 MG/ML IO SOLN
INTRAOCULAR | Status: AC
  Filled 2015-05-30: qty 1

## 2015-05-30 MED ORDER — SODIUM CHLORIDE 0.9 % IV SOLN
INTRAVENOUS | Status: DC
  Administered 2015-05-30: 07:00:00 via INTRAVENOUS

## 2015-05-30 MED ORDER — POVIDONE-IODINE 5 % OP SOLN
1.0000 "application " | OPHTHALMIC | Status: AC | PRN
  Administered 2015-05-30: 1 via OPHTHALMIC

## 2015-05-30 MED ORDER — NA CHONDROIT SULF-NA HYALURON 40-17 MG/ML IO SOLN
INTRAOCULAR | Status: DC | PRN
  Administered 2015-05-30: 1 mL via INTRAOCULAR

## 2015-05-30 MED ORDER — MOXIFLOXACIN HCL 0.5 % OP SOLN
OPHTHALMIC | Status: DC | PRN
  Administered 2015-05-30: 1 [drp] via OPHTHALMIC

## 2015-05-30 MED ORDER — MOXIFLOXACIN HCL 0.5 % OP SOLN: 1.0000 [drp] | OPHTHALMIC | Status: DC | PRN

## 2015-05-30 MED ORDER — CEFUROXIME OPHTHALMIC INJECTION 1 MG/0.1 ML
INJECTION | OPHTHALMIC | Status: AC
  Filled 2015-05-30: qty 0.1

## 2015-05-30 SURGICAL SUPPLY — 22 items
CANNULA ANT/CHMB 27GA (MISCELLANEOUS) ×3 IMPLANT
CUP MEDICINE 2OZ PLAST GRAD ST (MISCELLANEOUS) ×3 IMPLANT
GLOVE BIO SURGEON STRL SZ8 (GLOVE) ×3 IMPLANT
GLOVE BIOGEL M 6.5 STRL (GLOVE) ×3 IMPLANT
GLOVE SURG LX 8.0 MICRO (GLOVE) ×2
GLOVE SURG LX STRL 8.0 MICRO (GLOVE) ×1 IMPLANT
GOWN STRL REUS W/ TWL LRG LVL3 (GOWN DISPOSABLE) ×2 IMPLANT
GOWN STRL REUS W/TWL LRG LVL3 (GOWN DISPOSABLE) ×4
LENS IOL TECNIS 21.5 (Intraocular Lens) ×3 IMPLANT
LENS IOL TECNIS MONO 1P 21.5 (Intraocular Lens) ×1 IMPLANT
PACK CATARACT (MISCELLANEOUS) ×3 IMPLANT
PACK CATARACT BRASINGTON LX (MISCELLANEOUS) ×3 IMPLANT
PACK EYE AFTER SURG (MISCELLANEOUS) ×3 IMPLANT
SOL BSS BAG (MISCELLANEOUS) ×3
SOL PREP PVP 2OZ (MISCELLANEOUS) ×3
SOLUTION BSS BAG (MISCELLANEOUS) ×1 IMPLANT
SOLUTION PREP PVP 2OZ (MISCELLANEOUS) ×1 IMPLANT
SYR 3ML LL SCALE MARK (SYRINGE) ×3 IMPLANT
SYR 5ML LL (SYRINGE) ×3 IMPLANT
SYR TB 1ML 27GX1/2 LL (SYRINGE) ×3 IMPLANT
WATER STERILE IRR 1000ML POUR (IV SOLUTION) ×3 IMPLANT
WIPE NON LINTING 3.25X3.25 (MISCELLANEOUS) ×3 IMPLANT

## 2015-05-30 NOTE — Op Note (Signed)
PREOPERATIVE DIAGNOSIS:  Nuclear sclerotic cataract of the right eye.   POSTOPERATIVE DIAGNOSIS: nuclear sclerotic cataract right eye   OPERATIVE PROCEDURE:  Procedure(s): CATARACT EXTRACTION PHACO AND INTRAOCULAR LENS PLACEMENT (IOC)   SURGEON:  Galen ManilaWilliam Sarajane Fambrough, MD.   ANESTHESIA:  Anesthesiologist: Berdine AddisonMathai Thomas, MD CRNA: Ginger CarneStephanie [name], CRNA  1.      Managed anesthesia care. 2.      Topical tetracaine drops followed by 2% Xylocaine jelly applied in the preoperative holding area.   COMPLICATIONS:  None.   TECHNIQUE:   Stop and chop   DESCRIPTION OF PROCEDURE:  The patient was examined and consented in the preoperative holding area where the aforementioned topical anesthesia was applied to the right eye and then brought back to the Operating Room where the right eye was prepped and draped in the usual sterile ophthalmic fashion and a lid speculum was placed. A paracentesis was created with the side port blade and the anterior chamber was filled with viscoelastic. A near clear corneal incision was performed with the steel keratome. A continuous curvilinear capsulorrhexis was performed with a cystotome followed by the capsulorrhexis forceps. Hydrodissection and hydrodelineation were carried out with BSS on a blunt cannula. The lens was removed in a stop and chop  technique and the remaining cortical material was removed with the irrigation-aspiration handpiece. The capsular bag was inflated with viscoelastic and the Technis ZCB00  lens was placed in the capsular bag without complication. The remaining viscoelastic was removed from the eye with the irrigation-aspiration handpiece. The wounds were hydrated. The anterior chamber was flushed with Miostat and the eye was inflated to physiologic pressure. 0.1 mL of cefuroxime concentration 10 mg/mL was placed in the anterior chamber. The wounds were found to be water tight. The eye was dressed with Vigamox. The patient was given protective glasses  to wear throughout the day and a shield with which to sleep tonight. The patient was also given drops with which to begin a drop regimen today and will follow-up with me in one day.  Implant Name Type Inv. Item Serial No. Manufacturer Lot No. LRB No. Used  LENS IMPL INTRAOC ZCB00 21.5 - [id number] Intraocular Lens LENS IMPL INTRAOC ZCB00 21.5 [id number] AMO   Right 1   Procedure(s) with comments: CATARACT EXTRACTION PHACO AND INTRAOCULAR LENS PLACEMENT (IOC) (Right) - US 00:57 AP% 20.9 CDE 11.99 fluid pack lot #[id number]  Electronically signed: Rohin Krejci LOUIS 05/30/2015 7:48 AM

## 2015-05-30 NOTE — H&P (Signed)
  All labs reviewed. Abnormal studies sent to patients PCP when indicated.  Previous H&P reviewed, patient examined, there are NO CHANGES.  Andrea Range LOUIS11/29/20167:14 AM

## 2015-05-30 NOTE — Anesthesia Preprocedure Evaluation (Signed)
Anesthesia Evaluation  Patient identified by MRN, date of birth, ID band Patient awake    Reviewed: Allergy & Precautions, NPO status , Patient's Chart, lab work & pertinent test results, reviewed documented beta blocker date and time   Airway Mallampati: II  TM Distance: >3 FB     Dental  (+) Chipped   Pulmonary shortness of breath, asthma , COPD, Current Smoker,           Cardiovascular hypertension, Pt. on medications + Orthopnea  + dysrhythmias      Neuro/Psych PSYCHIATRIC DISORDERS Anxiety Depression    GI/Hepatic hiatal hernia, PUD, GERD  Medicated and Controlled,  Endo/Other  diabetes, Type 2  Renal/GU      Musculoskeletal  (+) Arthritis ,   Abdominal   Peds  Hematology  (+) anemia ,   Anesthesia Other Findings   Reproductive/Obstetrics                             Anesthesia Physical Anesthesia Plan  ASA: III  Anesthesia Plan: MAC   Post-op Pain Management:    Induction:   Airway Management Planned:   Additional Equipment:   Intra-op Plan:   Post-operative Plan:   Informed Consent: I have reviewed the patients History and Physical, chart, labs and discussed the procedure including the risks, benefits and alternatives for the proposed anesthesia with the patient or authorized representative who has indicated his/her understanding and acceptance.     Plan Discussed with: CRNA  Anesthesia Plan Comments:         Anesthesia Quick Evaluation

## 2015-05-30 NOTE — Discharge Instructions (Signed)
Eye Surgery Discharge Instructions  Expect mild scratchy sensation or mild soreness. DO NOT RUB YOUR EYE!  The day of surgery:  Minimal physical activity, but bed rest is not required  No reading, computer work, or close hand work  No bending, lifting, or straining.  May watch TV  For 24 hours:  No driving, legal decisions, or alcoholic beverages  Safety precautions  Eat anything you prefer: It is better to start with liquids, then soup then solid foods.  _____ Eye patch should be worn until postoperative exam tomorrow.  ____ Solar shield eyeglasses should be worn for comfort in the sunlight/patch while sleeping  Resume all regular medications including aspirin or Coumadin if these were discontinued prior to surgery. You may shower, bathe, shave, or wash your hair. Tylenol may be taken for mild discomfort.  Call your doctor if you experience significant pain, nausea, or vomiting, fever > 101 or other signs of infection. [phone] or [phone] Specific instructions:  Follow-up Information    Follow up with Carlena BjornstadPORFILIO,WILLIAM LOUIS, MD On 05/31/2015.   Specialty:  Ophthalmology   Why:  10:00   Contact information:   [address] [place] [id number] [phone]

## 2015-05-30 NOTE — Anesthesia Procedure Notes (Signed)
Procedure Name: MAC Date/Time: 05/30/2015 7:20 AM Performed by: Ginger CarneMICHELET, [name] Pre-anesthesia Checklist: Patient identified, Emergency Drugs available, Suction available, Patient being monitored and Timeout performed Patient Re-evaluated:Patient Re-evaluated prior to inductionOxygen Delivery Method: Nasal cannula

## 2015-05-30 NOTE — Progress Notes (Signed)
Last dil gel applied 0710 as instructed by Mordecai Rasmussenose Carreau, RN

## 2015-05-30 NOTE — Transfer of Care (Signed)
Immediate Anesthesia Transfer of Care Note  Patient: Andrea Holden  Procedure(s) Performed: Procedure(s) with comments: CATARACT EXTRACTION PHACO AND INTRAOCULAR LENS PLACEMENT (IOC) (Right) - US 00:57 AP% 20.9 CDE 11.99 fluid pack lot #[id number]  Patient Location: PACU  Anesthesia Type:MAC  Level of Consciousness: awake, alert  and oriented  Airway & Oxygen Therapy: Patient Spontanous Breathing  Post-op Assessment: Report given to RN  Post vital signs: Reviewed and stable  Last Vitals:  Filed Vitals:   05/30/15 0607  BP: 185/75  Pulse: 67  Temp: 35.3 C  Resp: 16    Complications: No apparent anesthesia complications

## 2015-05-30 NOTE — Progress Notes (Signed)
IV right hand - p ox right mid finger, ecg leads/shoe covers in place

## 2015-05-30 NOTE — Anesthesia Postprocedure Evaluation (Signed)
Anesthesia Post Note  Patient: Andrea Holden  Procedure(s) Performed: Procedure(s) (LRB): CATARACT EXTRACTION PHACO AND INTRAOCULAR LENS PLACEMENT (IOC) (Right)  Patient location during evaluation: PACU Anesthesia Type: MAC Level of consciousness: awake and awake and alert Pain management: pain level controlled Respiratory status: spontaneous breathing Cardiovascular status: stable Postop Assessment: no headache Anesthetic complications: no    Last Vitals:  Filed Vitals:   05/30/15 0745 05/30/15 0806  BP: 150/59 134/80  Pulse: 60 62  Temp: 36.7 C   Resp: 16 16    Last Pain:  Filed Vitals:   05/30/15 0812  PainSc: 0-No pain                 Constantine Ruddick S

## 2015-06-02 ENCOUNTER — Telehealth: Payer: Self-pay | Admitting: Family Medicine

## 2015-06-02 ENCOUNTER — Other Ambulatory Visit: Payer: Self-pay | Admitting: Family Medicine

## 2015-06-02 DIAGNOSIS — G8929 Other chronic pain: Secondary | ICD-10-CM

## 2015-06-02 DIAGNOSIS — M5416 Radiculopathy, lumbar region: Principal | ICD-10-CM

## 2015-06-02 NOTE — Telephone Encounter (Signed)
Requesting refill on hydrocodone. She will be completely out by tomorrow. Requesting a call once it is ready

## 2015-06-02 NOTE — Telephone Encounter (Signed)
Refill request was sent to Dr. Ashany Sundaram for approval and submission.  

## 2015-06-05 ENCOUNTER — Telehealth: Payer: Self-pay

## 2015-06-05 MED ORDER — HYDROCODONE-ACETAMINOPHEN 10-325 MG PO TABS: 1.0000 | ORAL_TABLET | Freq: Two times a day (BID) | ORAL | Status: DC | PRN

## 2015-06-05 NOTE — Telephone Encounter (Signed)
Patient came in to pick up her rx for pain meds and stated that she just had hot coffee spilled on her at [employer]. She stated most of it was spilled on her clothes and her left thumb, but the other fingers were ok. She stated that her left thumb had some tingling sensation. Her fingers on the left hand looked a little pink but the thumb was slightly swollen. She was informed that if it was a actual burn that there may be some peeling but that it will be ok as long as it was not draining. She was encouraged to keep the area moisturized with triple antibiotic ointment, aloe vera gel or coco butter and to monitor it, but that if she noticed anything out of the ordinary to give us a call.

## 2015-06-09 ENCOUNTER — Telehealth: Payer: Self-pay | Admitting: Family Medicine

## 2015-06-09 DIAGNOSIS — Z1231 Encounter for screening mammogram for malignant neoplasm of breast: Secondary | ICD-10-CM

## 2015-06-09 NOTE — Telephone Encounter (Signed)
Refill request was sent to Dr. Ashany Sundaram for approval and submission.  

## 2015-06-09 NOTE — Telephone Encounter (Signed)
Pt needs refill on Chantix. Pt states she does not need the starter pack, she needs the continuance meds.Pt also is asking for a mammogram to be scheduled for her. She is requesting [facility] to [facility].

## 2015-06-12 MED ORDER — VARENICLINE TARTRATE 1 MG PO TABS: 1.0000 mg | ORAL_TABLET | Freq: Two times a day (BID) | ORAL | Status: DC

## 2015-06-12 NOTE — Telephone Encounter (Signed)
LMOM to have pt call the office back.

## 2015-06-12 NOTE — Telephone Encounter (Signed)
Sent in Chantix maintenance pack to pharmacy. Ordered mammogram, but clarify to patient she needs to call [place] and schedule mammogram. I do not order to have [facility], she will have to call [facility] for that if they even still do mammograms.

## 2015-06-15 ENCOUNTER — Other Ambulatory Visit: Payer: Self-pay | Admitting: Family Medicine

## 2015-06-15 ENCOUNTER — Ambulatory Visit
Admission: RE | Admit: 2015-06-15 | Discharge: 2015-06-15 | Disposition: A | Discharge status: Home / Self Care | Payer: Medicare Other | Source: Ambulatory Visit | Attending: Family Medicine | Admitting: Family Medicine

## 2015-06-15 DIAGNOSIS — Z1231 Encounter for screening mammogram for malignant neoplasm of breast: Secondary | ICD-10-CM | POA: Diagnosis not present

## 2015-06-19 ENCOUNTER — Other Ambulatory Visit: Payer: Self-pay | Admitting: Family Medicine

## 2015-06-19 DIAGNOSIS — R928 Other abnormal and inconclusive findings on diagnostic imaging of breast: Secondary | ICD-10-CM | POA: Insufficient documentation

## 2015-06-28 ENCOUNTER — Ambulatory Visit
Admission: RE | Admit: 2015-06-28 | Discharge: 2015-06-28 | Disposition: A | Discharge status: Home / Self Care | Payer: Medicare Other | Source: Ambulatory Visit | Attending: Family Medicine | Admitting: Family Medicine

## 2015-06-28 ENCOUNTER — Other Ambulatory Visit: Payer: Self-pay | Admitting: Family Medicine

## 2015-06-28 DIAGNOSIS — R921 Mammographic calcification found on diagnostic imaging of breast: Secondary | ICD-10-CM | POA: Insufficient documentation

## 2015-06-28 DIAGNOSIS — N6489 Other specified disorders of breast: Secondary | ICD-10-CM | POA: Diagnosis not present

## 2015-06-28 DIAGNOSIS — R928 Other abnormal and inconclusive findings on diagnostic imaging of breast: Secondary | ICD-10-CM

## 2015-07-04 ENCOUNTER — Other Ambulatory Visit: Payer: Self-pay | Admitting: Family Medicine

## 2015-07-04 MED ORDER — ALBUTEROL SULFATE HFA 108 (90 BASE) MCG/ACT IN AERS: 1.0000 | INHALATION_SPRAY | Freq: Four times a day (QID) | RESPIRATORY_TRACT | Status: DC | PRN

## 2015-07-04 NOTE — Telephone Encounter (Signed)
Also chantix?

## 2015-07-04 NOTE — Telephone Encounter (Signed)
Patient requesting refills on: ProAir (need today ) and chantix (the continuing one). [facility]

## 2015-07-06 ENCOUNTER — Other Ambulatory Visit: Payer: Self-pay

## 2015-07-06 NOTE — Telephone Encounter (Signed)
Opened in error

## 2015-07-10 ENCOUNTER — Other Ambulatory Visit: Payer: Self-pay | Admitting: Family Medicine

## 2015-07-11 ENCOUNTER — Other Ambulatory Visit: Payer: Self-pay

## 2015-07-11 MED ORDER — VARENICLINE TARTRATE 1 MG PO TABS: 1.0000 mg | ORAL_TABLET | Freq: Two times a day (BID) | ORAL | Status: DC

## 2015-07-11 MED ORDER — ALBUTEROL SULFATE HFA 108 (90 BASE) MCG/ACT IN AERS: 1.0000 | INHALATION_SPRAY | Freq: Four times a day (QID) | RESPIRATORY_TRACT | Status: DC | PRN

## 2015-07-11 NOTE — Telephone Encounter (Signed)
Was sent to the wrong pharmacy she wanted it sent to local instead of mail order

## 2015-07-12 ENCOUNTER — Telehealth: Payer: Self-pay

## 2015-07-12 NOTE — Telephone Encounter (Signed)
Patients ins. Will not cover proventil.  Please resend new rx for proair

## 2015-07-14 NOTE — Telephone Encounter (Signed)
ERRENOUS °

## 2015-07-17 ENCOUNTER — Other Ambulatory Visit: Payer: Self-pay | Admitting: Family Medicine

## 2015-07-17 MED ORDER — PROAIR HFA 108 (90 BASE) MCG/ACT IN AERS: 1.0000 | INHALATION_SPRAY | Freq: Four times a day (QID) | RESPIRATORY_TRACT | Status: DC | PRN

## 2015-07-17 NOTE — Telephone Encounter (Signed)
[facility]

## 2015-08-15 ENCOUNTER — Ambulatory Visit (INDEPENDENT_AMBULATORY_CARE_PROVIDER_SITE_OTHER): Payer: Medicare Other | Admitting: Family Medicine

## 2015-08-15 ENCOUNTER — Encounter: Payer: Self-pay | Admitting: Family Medicine

## 2015-08-15 VITALS — BP 120/68 | HR 82 | Temp 97.5°F | Resp 14 | Ht 62.0 in | Wt 193.8 lb

## 2015-08-15 DIAGNOSIS — Z23 Encounter for immunization: Secondary | ICD-10-CM | POA: Diagnosis not present

## 2015-08-15 DIAGNOSIS — M5416 Radiculopathy, lumbar region: Secondary | ICD-10-CM | POA: Diagnosis not present

## 2015-08-15 DIAGNOSIS — J449 Chronic obstructive pulmonary disease, unspecified: Secondary | ICD-10-CM | POA: Diagnosis not present

## 2015-08-15 DIAGNOSIS — E119 Type 2 diabetes mellitus without complications: Secondary | ICD-10-CM | POA: Diagnosis not present

## 2015-08-15 DIAGNOSIS — K219 Gastro-esophageal reflux disease without esophagitis: Secondary | ICD-10-CM | POA: Diagnosis not present

## 2015-08-15 DIAGNOSIS — I10 Essential (primary) hypertension: Secondary | ICD-10-CM

## 2015-08-15 DIAGNOSIS — G8929 Other chronic pain: Secondary | ICD-10-CM

## 2015-08-15 DIAGNOSIS — M2550 Pain in unspecified joint: Secondary | ICD-10-CM | POA: Diagnosis not present

## 2015-08-15 LAB — POCT GLYCOSYLATED HEMOGLOBIN (HGB A1C): Hemoglobin A1C: 6.2

## 2015-08-15 MED ORDER — HYDROCODONE-ACETAMINOPHEN 10-325 MG PO TABS: 1.0000 | ORAL_TABLET | Freq: Two times a day (BID) | ORAL | Status: DC | PRN

## 2015-08-15 MED ORDER — VOLTAREN 1 % TD GEL: 2.0000 g | Freq: Four times a day (QID) | TRANSDERMAL | Status: DC

## 2015-08-15 MED ORDER — PROAIR HFA 108 (90 BASE) MCG/ACT IN AERS: 1.0000 | INHALATION_SPRAY | Freq: Four times a day (QID) | RESPIRATORY_TRACT | Status: DC | PRN

## 2015-08-15 NOTE — Progress Notes (Signed)
Name: Andrea Holden   MRN: [id number]    DOB: [birth year]   Date:08/15/2015       Progress Note  Subjective  Chief Complaint  Chief Complaint  Patient presents with  . Medication Refill  . Diabetes  . Hypertension  . Gastroesophageal Reflux    HPI  Andrea Holden is a 70 year old female with known history of HTN, HLD, DM II, COPD, GERD, chronic lumbar back pain, history of bariatric surgery here for routine follow up of chronic medical conditions.   Working with Neurosurgical spine specialist Dr. Julio Sicks regarding her chronic lumbar back pain. She continues to complain of low back pain. Most recent MRI of Lumbar spine was done 03/17/15 (see results under imaging). Patient complains of arthralgias for which has been present for several years. Pain is located in Hips, lower back, is described as aching, and is constant, mild to moderate. Associated symptoms do not include saddle paresthesia, loss of involuntary bowel or bladder, focal neurological deficit. The patient has using gabapentin daily and vicodin daily. Related to injury:not that she can recall. She would like a refill of Voltaren gel as well which helps with joint pain management and reduction of opioid medication consumption. She can not take NSAIDs too much orally due to history of bariatric surgery and contraindication.   In addition she is here for HTN, HLD, DMII follow up. First diagnosed with hypertension several years ago. Current anti-hypertension medication regimen includes dietary modification, weight management and Lisinopril 10mg , Metoprolol Tartate 25mg  po bid, Lasix 40mg  one a day. Patient is following physician recommended management. Associated symptoms do not include headache, dizziness, nausea, lower extremity swelling, shortness of breath, chest pain, numbness. She is not on any medication therapy currently for DM II but reports prior to her bariatric surgery she even needed insulin therapy.  Not on any  statin therapy either. She is interested in smoking cessation. In the past Chantix has helped but she can not afford the co pay with her insurance now. At our last visits she was prescribed Wellbutrin along with nicotine patches.   Paitent complains of heartburn. This has been associated with belching, heartburn and waterbrash.  She has no other symptoms. Symptoms have been present for several years. She denies dysphagia.  She has not lost unwanted weight. She denies melena, hematochezia, hematemesis, and coffee ground emesis. Medical therapy in the past has included antacids, H2 antagonists and proton pump inhibitors.  INSURANCE WILL NOT COVER PROVENTIL BUT THEY WILL COVER PROAIR.    Past Medical History  Diagnosis Date  . Diabetes mellitus   . Hypertension   . Allergy     seasonal allergies  . Asthma     allergy induced asthma  . Anemia   . Arthritis   . Anxiety   . Cataract   . Depression   . GERD (gastroesophageal reflux disease)   . Irregular heartbeat   . Degenerative disc disease, lumbar   . Clotting disorder (HCC)     history of blood clots  . Hyperlipidemia   . H/O eating disorder   . Degenerative disc disease, lumbar   . History of chicken pox   . History of measles, mumps, or rubella   . H/O transfusion     patient was given 5 units of blood while at [facility], blood type O+  . Moderate COPD (chronic obstructive pulmonary disease) (HCC) 12/14/2014  . Complication of anesthesia     arrhythmia following colonoscopy  . Dysrhythmia   .  Neuropathy (HCC)   . History of hiatal hernia   . HOH (hard of hearing)   . Orthopnea   . Wheezing     Patient Active Problem List   Diagnosis Date Noted  . Abnormal mammogram of right breast 06/19/2015  . Cerumen impaction 03/16/2015  . Diabetes mellitus type II, controlled, with no complications (HCC) 12/14/2014  . Hypertension goal BP (blood pressure) < 140/90 12/14/2014  . Hyperlipidemia LDL goal <100 12/14/2014  . History of  transfusion of packed RBC 12/14/2014  . COPD, mild (HCC) 12/14/2014  . Cigarette smoker 12/14/2014  . Status post bariatric surgery 12/14/2014  . Obesity, Class I, BMI 30-34.9 12/14/2014  . Chronic radicular lumbar pain 12/14/2014  . History of GI bleed 11/12/2014  . Acute gastrojejunal ulcer 11/12/2014  . History of small bowel obstruction 09/07/2014    Social History  Substance Use Topics  . Smoking status: Current Some Day Smoker    Types: Cigarettes    Last Attempt to Quit: 10/03/2007  . Smokeless tobacco: Never Used  . Alcohol Use: 0.0 oz/week    0 Standard drinks or equivalent per week     Comment: OCCASIONALLY     Current outpatient prescriptions:  .  aspirin EC 81 MG tablet, Take 81 mg by mouth daily., Disp: , Rfl:  .  furosemide (LASIX) 40 MG tablet, Take 1 tablet by mouth  daily, Disp: 90 tablet, Rfl: 1 .  gabapentin (NEURONTIN) 100 MG capsule, Take 2 capsules by mouth  twice daily, Disp: 360 capsule, Rfl: 1 .  HYDROcodone-acetaminophen (NORCO) 10-325 MG tablet, Take 1 tablet by mouth 2 (two) times daily as needed for severe pain., Disp: 60 tablet, Rfl: 0 .  lisinopril (PRINIVIL,ZESTRIL) 10 MG tablet, Take 1 tablet by mouth  daily, Disp: 90 tablet, Rfl: 1 .  metoprolol succinate (TOPROL-XL) 25 MG 24 hr tablet, Take 1 tablet by mouth  daily, Disp: 90 tablet, Rfl: 3 .  pantoprazole (PROTONIX) 40 MG tablet, Take 1 tablet by mouth  daily, Disp: 90 tablet, Rfl: 1 .  sertraline (ZOLOFT) 100 MG tablet, Take 1 tablet by mouth  daily, Disp: 90 tablet, Rfl: 1 .  Vitamin D, Ergocalciferol, (DRISDOL) 50000 UNITS CAPS capsule, , Disp: , Rfl:   Past Surgical History  Procedure Laterality Date  . Ovary surgery    . Tonsillectomy    . Abdominal hysterectomy    . Gallbladder surgery    . Mouth surgery    . Internal bleeding  2016  . Gastric bypass  2010  . Cholecystectomy    . Colon surgery      blocked colon  . Cataract extraction w/phaco Right 05/30/2015    Procedure:  CATARACT EXTRACTION PHACO AND INTRAOCULAR LENS PLACEMENT (IOC);  Surgeon: Galen Manila, MD;  Location: [facility] ORS;  Service: Ophthalmology;  Laterality: Right;  [place] 00:57 AP% 20.9 CDE 11.99 fluid pack lot #[id number]    Family History  Problem Relation Age of Onset  . Asthma Mother   . COPD Mother   . Arthritis Father   . Depression Father   . Heart disease Father   . Hypertension Father   . Stroke Father   . Arthritis Brother   . Depression Brother   . Diabetes Brother   . Heart disease Brother   . Hyperlipidemia Brother   . Hypertension Brother   . Stroke Brother   . Vision loss Brother   . Diabetes Maternal Grandmother   . Breast cancer Neg Hx  Allergies  Allergen Reactions  . Sitagliptin Hives and Itching     Review of Systems  CONSTITUTIONAL: No significant weight changes, fever, chills, weakness or fatigue.  SKIN: No rash or itching.  CARDIOVASCULAR: No chest pain, chest pressure or chest discomfort. No palpitations or edema.  RESPIRATORY: No shortness of breath, cough or sputum.  GASTROINTESTINAL: No anorexia, nausea, vomiting. No changes in bowel habits. No abdominal pain or blood.  GENITOURINARY: No dysuria. No frequency. No discharge.  NEUROLOGICAL: No headache, dizziness, syncope, paralysis, ataxia, numbness or tingling in the extremities. No memory changes. No change in bowel or bladder control.  MUSCULOSKELETAL: Yes chronic joint pain. No muscle pain. HEMATOLOGIC: No anemia, bleeding or bruising.  LYMPHATICS: No enlarged lymph nodes.  PSYCHIATRIC: No change in mood. No change in sleep pattern.  ENDOCRINOLOGIC: No reports of sweating, cold or heat intolerance. No polyuria or polydipsia.     Objective  BP 120/68 mmHg  Pulse 82  Temp(Src) 97.5 F (36.4 C) (Oral)  Resp 14  Ht  (1.575 m)  Wt 193 lb 12.8 oz (87.907 kg)  BMI 35.44 kg/m2  SpO2 92% Body mass index is 35.44 kg/(m^2).  Physical Exam  Constitutional: Patient has gained 10 lbs  since her last visit 03/16/15. Well-nourished. In no distress.  Neck: Normal range of motion. Neck supple. No JVD present. No thyromegaly present.  Cardiovascular: Normal rate, regular rhythm and normal heart sounds.  No murmur heard.  Pulmonary/Chest: Effort normal and breath sounds normal. No respiratory distress. Musculoskeletal: Normal range of motion bilateral UE and LE, no joint effusions. Lumbar spine with no palpable spinal step off. Present paraspinal muscle spasm. Positive straight leg testing on the left. Peripheral vascular: Bilateral LE no edema. Neurological: CN II-XII grossly intact with no focal deficits. Alert and oriented to person, place, and time. Coordination, balance, strength, speech are normal. Walks with assistance of a cane. Skin: Skin is warm and dry. No rash noted. No erythema.  Psychiatric: Patient has a normal mood and affect. Behavior is normal in office today. Judgment and thought content normal in office today.   Recent Results (from the past 2160 hour(s))  Glucose, capillary     Status: Abnormal   Collection Time: 05/30/15  6:16 AM  Result Value Ref Range   Glucose-Capillary 118 (H) 65 - 99 mg/dL     Assessment & Plan   1. Diabetes mellitus without complication (HCC) Well controled on diet and lifestyle changes.  Patient's Hba1c goal is <7% is acceptable  Patient's Hba1c goal is <8% as this is reasonable for the elderly population who is at risk for hypoglycemic events.   Reviewed diet, exercise, lifestyle changes and current medication regimen pertaining to diabetes with the patient.   Reminded patient of the required annual dilated retinal exam.   - POCT HgB A1C - POCT UA - Microalbumin  2. Need for Tdap vaccination  - Tdap vaccine greater than or equal to 7yo IM  3. Hypertension goal BP (blood pressure) < 140/90 Well controled.  4. Chronic radicular lumbar pain Doing well on current regimen with modest use of Norco. Prescribed Voltaren  use as needed.  - VOLTAREN 1 % GEL; Apply 2 g topically 4 (four) times daily.  Dispense: 100 g; Refill: 3 - HYDROcodone-acetaminophen (NORCO) 10-325 MG tablet; Take 1 tablet by mouth 2 (two) times daily as needed for severe pain.  Dispense: 60 tablet; Refill: 0  5. GERD without esophagitis Stable.   6. COPD, mild (HCC) Stable. Per patient  she needs ProAir not any other kind of rescue inhaler type due to insurance.  - PROAIR HFA 108 (90 Base) MCG/ACT inhaler; Inhale 1-2 puffs into the lungs every 6 (six) hours as needed for wheezing or shortness of breath.  Dispense: 18 g; Refill: 3  7. Chronic pain of multiple joints Stable.  - VOLTAREN 1 % GEL; Apply 2 g topically 4 (four) times daily.  Dispense: 100 g; Refill: 3

## 2015-08-16 ENCOUNTER — Other Ambulatory Visit: Payer: Self-pay

## 2015-08-16 DIAGNOSIS — M2550 Pain in unspecified joint: Secondary | ICD-10-CM

## 2015-08-16 DIAGNOSIS — G8929 Other chronic pain: Secondary | ICD-10-CM

## 2015-08-16 DIAGNOSIS — M5416 Radiculopathy, lumbar region: Principal | ICD-10-CM

## 2015-08-16 MED ORDER — VOLTAREN 1 % TD GEL: 2.0000 g | Freq: Four times a day (QID) | TRANSDERMAL | Status: DC

## 2015-08-21 ENCOUNTER — Other Ambulatory Visit: Payer: Self-pay

## 2015-08-21 DIAGNOSIS — M5416 Radiculopathy, lumbar region: Principal | ICD-10-CM

## 2015-08-21 DIAGNOSIS — M2550 Pain in unspecified joint: Secondary | ICD-10-CM

## 2015-08-21 DIAGNOSIS — G8929 Other chronic pain: Secondary | ICD-10-CM

## 2015-08-21 DIAGNOSIS — J449 Chronic obstructive pulmonary disease, unspecified: Secondary | ICD-10-CM

## 2015-08-21 MED ORDER — VOLTAREN 1 % TD GEL: 2.0000 g | Freq: Four times a day (QID) | TRANSDERMAL | Status: DC

## 2015-08-21 MED ORDER — PROAIR HFA 108 (90 BASE) MCG/ACT IN AERS: 1.0000 | INHALATION_SPRAY | Freq: Four times a day (QID) | RESPIRATORY_TRACT | Status: DC | PRN

## 2015-08-29 ENCOUNTER — Other Ambulatory Visit: Payer: Self-pay

## 2015-08-29 DIAGNOSIS — G8929 Other chronic pain: Secondary | ICD-10-CM

## 2015-08-29 DIAGNOSIS — M5416 Radiculopathy, lumbar region: Principal | ICD-10-CM

## 2015-11-06 ENCOUNTER — Other Ambulatory Visit: Payer: Self-pay | Admitting: Family Medicine

## 2015-11-07 ENCOUNTER — Telehealth: Payer: Self-pay

## 2015-11-07 MED ORDER — VARENICLINE TARTRATE 0.5 MG X 11 & 1 MG X 42 PO MISC: ORAL | Status: DC

## 2015-11-07 NOTE — Telephone Encounter (Signed)
Will send starter kit; was on previous med list per staff

## 2015-11-07 NOTE — Telephone Encounter (Signed)
I don't recommend nicotine replacement AND Chantix Please have her schedule an appointment for smoking cessation if needed I already sent the Chantix

## 2015-11-07 NOTE — Telephone Encounter (Signed)
Patient requesting refill. 

## 2015-11-07 NOTE — Telephone Encounter (Signed)
Not on current med list but would like refill on chantix?

## 2015-11-08 NOTE — Telephone Encounter (Signed)
Pt has questions about this not being on her med list and would like a call back.

## 2015-11-16 ENCOUNTER — Encounter: Payer: Self-pay | Admitting: Family Medicine

## 2015-11-16 ENCOUNTER — Ambulatory Visit (INDEPENDENT_AMBULATORY_CARE_PROVIDER_SITE_OTHER): Payer: Medicare Other | Admitting: Family Medicine

## 2015-11-16 VITALS — BP 118/62 | HR 80 | Temp 97.9°F | Resp 18 | Ht 62.0 in | Wt 207.6 lb

## 2015-11-16 DIAGNOSIS — E785 Hyperlipidemia, unspecified: Secondary | ICD-10-CM

## 2015-11-16 DIAGNOSIS — J449 Chronic obstructive pulmonary disease, unspecified: Secondary | ICD-10-CM

## 2015-11-16 DIAGNOSIS — M722 Plantar fascial fibromatosis: Secondary | ICD-10-CM | POA: Diagnosis not present

## 2015-11-16 DIAGNOSIS — Z8719 Personal history of other diseases of the digestive system: Secondary | ICD-10-CM

## 2015-11-16 DIAGNOSIS — M7730 Calcaneal spur, unspecified foot: Secondary | ICD-10-CM

## 2015-11-16 DIAGNOSIS — E1140 Type 2 diabetes mellitus with diabetic neuropathy, unspecified: Secondary | ICD-10-CM | POA: Insufficient documentation

## 2015-11-16 DIAGNOSIS — Z79899 Other long term (current) drug therapy: Secondary | ICD-10-CM

## 2015-11-16 DIAGNOSIS — Z5181 Encounter for therapeutic drug level monitoring: Secondary | ICD-10-CM | POA: Diagnosis not present

## 2015-11-16 DIAGNOSIS — L600 Ingrowing nail: Secondary | ICD-10-CM | POA: Insufficient documentation

## 2015-11-16 DIAGNOSIS — G8929 Other chronic pain: Secondary | ICD-10-CM

## 2015-11-16 DIAGNOSIS — M25571 Pain in right ankle and joints of right foot: Secondary | ICD-10-CM | POA: Diagnosis not present

## 2015-11-16 DIAGNOSIS — E1142 Type 2 diabetes mellitus with diabetic polyneuropathy: Secondary | ICD-10-CM

## 2015-11-16 DIAGNOSIS — M2141 Flat foot [pes planus] (acquired), right foot: Secondary | ICD-10-CM | POA: Diagnosis not present

## 2015-11-16 DIAGNOSIS — Z9884 Bariatric surgery status: Secondary | ICD-10-CM

## 2015-11-16 DIAGNOSIS — R635 Abnormal weight gain: Secondary | ICD-10-CM

## 2015-11-16 DIAGNOSIS — M2142 Flat foot [pes planus] (acquired), left foot: Secondary | ICD-10-CM | POA: Insufficient documentation

## 2015-11-16 DIAGNOSIS — E669 Obesity, unspecified: Secondary | ICD-10-CM

## 2015-11-16 DIAGNOSIS — M5416 Radiculopathy, lumbar region: Secondary | ICD-10-CM

## 2015-11-16 DIAGNOSIS — F1190 Opioid use, unspecified, uncomplicated: Secondary | ICD-10-CM

## 2015-11-16 HISTORY — DX: Plantar fascial fibromatosis: M72.2

## 2015-11-16 HISTORY — DX: Flat foot (pes planus) (acquired), right foot: M21.41

## 2015-11-16 HISTORY — DX: Type 2 diabetes mellitus with diabetic neuropathy, unspecified: E11.40

## 2015-11-16 HISTORY — DX: Flat foot (pes planus) (acquired), left foot: M21.42

## 2015-11-16 HISTORY — DX: Opioid use, unspecified, uncomplicated: F11.90

## 2015-11-16 MED ORDER — PROAIR HFA 108 (90 BASE) MCG/ACT IN AERS: 1.0000 | INHALATION_SPRAY | Freq: Four times a day (QID) | RESPIRATORY_TRACT | Status: DC | PRN

## 2015-11-16 MED ORDER — HYDROCODONE-ACETAMINOPHEN 10-325 MG PO TABS: 0.5000 | ORAL_TABLET | Freq: Two times a day (BID) | ORAL | Status: DC | PRN

## 2015-11-16 MED ORDER — GABAPENTIN 300 MG PO CAPS: ORAL_CAPSULE | ORAL | Status: DC

## 2015-11-16 MED ORDER — TURMERIC CURCUMIN PO CAPS: ORAL_CAPSULE | ORAL | Status: DC

## 2015-11-16 NOTE — Assessment & Plan Note (Signed)
Start gabapentin

## 2015-11-16 NOTE — Assessment & Plan Note (Signed)
Discussed risk of controlled substances including possible unintentional overdose, especially if mixed with alcohol or other pills; typical speech given including illegal to share, even out of the goodness of patient's heart, always keep in the original bottle, safeguard medicine, do NOT mix with alcohol, other pain pills, "nerve" or anxiety pills, or sleeping pills; I am not obligated to approve of early refill or give new prescription if medicine is lost, stolen, or destroyed even with a police report, etc.; patient agrees with plan; controlled substance contract signed; copy of contract given to patient 

## 2015-11-16 NOTE — Patient Instructions (Signed)
Have labs done today We'll get you to the ankle / foot doctor Check out the information at familydoctor.org https://familydoctor.org/nutrition-weight-loss-need-know-fad-diets/ Try to lose between 1-2 pounds per week by taking in fewer calories and burning off more calories You can succeed by limiting portions, limiting foods dense in calories and fat, becoming more active, and drinking 8 glasses of water a day (64 ounces) Don't skip meals, especially breakfast, as skipping meals may alter your metabolism Do not use over-the-counter weight loss pills or gimmicks that claim rapid weight loss A healthy BMI (or body mass index) is between 18.5 and 24.9 You can calculate your ideal BMI at the NIH website [employer]huhttp://www.nhlbi.nih.gov/health/educational/lose_wt/BMI/bmicalc.htm

## 2015-11-16 NOTE — Assessment & Plan Note (Addendum)
Avoid oral NSAIDs

## 2015-11-16 NOTE — Assessment & Plan Note (Signed)
Check B12 

## 2015-11-16 NOTE — Progress Notes (Signed)
BP 118/62 mmHg  Pulse 80  Temp(Src) 97.9 F (36.6 C)  Resp 18  Ht  (1.575 m)  Wt 207 lb 9 oz (94.15 kg)  BMI 37.95 kg/m2  SpO2 96%   Subjective:    Patient ID: Andrea Holden, female    DOB: Dec 25, 1945, 70 y.o.   MRN: [id number]  HPI: Andrea Holden is a 70 y.o. female  Chief Complaint  Patient presents with  . Pain    medication refills   Patient is new to me today; her previous provider left the office  She has pain in her feet, right is much worse than the other; going on for a long time Medial ankle curved in and has very flat feet; saw podiatrist Has tried custom molded shoes and they gave her hard soles, and that hurt her feet even worse Heel spurs, plantar fasciitis, pes planus, ingrown toenails both great toes Had the neuropathy testing once and hopes to never have that done again, b/c the testing itself was painful For the pain, hydrocodone 1/2 pill BID; does not make her loopy; no constipation She has tried others things, this works best Uses mentholatum type roller on ankle and top of foot Sometimes top of foot is so painful that she can't touch it, thought maybe gout; no one has checked  Last A1c 6.2 in Feb 2017  Asthma spray needed; she needs refills  Smoker; she says that she can't afford the Chantix right now  Gaining weight; had obesity surgery but is gaining her weight back  Had bleeding ulcers in 2016; hospitalized, had five units of bleed, almost died; cannot take oral NSAIDs; only tylenol Reviewed previous hemoglobins; she used to take meloxicam  Depression screen [facility] 2/9 08/15/2015 03/16/2015 12/14/2014  Decreased Interest 0 0 0  Down, Depressed, Hopeless 0 0 1  PHQ - 2 Score 0 0 1   Relevant past medical, surgical, family and social history reviewed Past Medical History  Diagnosis Date  . Diabetes mellitus   . Hypertension   . Allergy     seasonal allergies  . Asthma     allergy induced asthma  . Anemia   . Arthritis   .  Anxiety   . Cataract   . Depression   . GERD (gastroesophageal reflux disease)   . Irregular heartbeat   . Degenerative disc disease, lumbar   . Clotting disorder (HCC)     history of blood clots  . Hyperlipidemia   . H/O eating disorder   . Degenerative disc disease, lumbar   . History of chicken pox   . History of measles, mumps, or rubella   . H/O transfusion     patient was given 5 units of blood while at [facility], blood type O+  . Moderate COPD (chronic obstructive pulmonary disease) (HCC) 12/14/2014  . Complication of anesthesia     arrhythmia following colonoscopy  . Dysrhythmia   . Neuropathy (HCC)   . History of hiatal hernia   . HOH (hard of hearing)   . Orthopnea   . Wheezing   . Pes planus of both feet 11/16/2015  . Plantar fasciitis of right foot 11/16/2015  . Diabetic neuropathy (HCC) 11/16/2015  . Opiate use 11/16/2015   Past Surgical History  Procedure Laterality Date  . Ovary surgery    . Tonsillectomy    . Abdominal hysterectomy    . Gallbladder surgery    . Mouth surgery    . Internal bleeding  2016  . Gastric bypass  2010  . Cholecystectomy    . Colon surgery      blocked colon  . Cataract extraction w/phaco Right 05/30/2015    Procedure: CATARACT EXTRACTION PHACO AND INTRAOCULAR LENS PLACEMENT (IOC);  Surgeon: Galen ManilaWilliam Porfilio, MD;  Location: [facility] ORS;  Service: Ophthalmology;  Laterality: Right;  US 00:57 AP% 20.9 CDE 11.99 fluid pack lot #[id number]   Family History  Problem Relation Age of Onset  . Asthma Mother   . COPD Mother   . Arthritis Father   . Depression Father   . Heart disease Father   . Hypertension Father   . Stroke Father   . Arthritis Brother   . Depression Brother   . Diabetes Brother   . Heart disease Brother   . Hyperlipidemia Brother   . Hypertension Brother   . Stroke Brother   . Vision loss Brother   . Diabetes Maternal Grandmother   . Breast cancer Neg Hx   daughter - thyroid problems  Social History    Substance Use Topics  . Smoking status: Current Some Day Smoker    Types: Cigarettes    Last Attempt to Quit: 10/03/2007  . Smokeless tobacco: Never Used  . Alcohol Use: 0.0 oz/week    0 Standard drinks or equivalent per week     Comment: OCCASIONALLY   Interim medical history since last visit reviewed. Allergies and medications reviewed  Review of Systems Per HPI unless specifically indicated above     Objective:    BP 118/62 mmHg  Pulse 80  Temp(Src) 97.9 F (36.6 C)  Resp 18  Ht 5\' 2"  (1.575 m)  Wt 207 lb 9 oz (94.15 kg)  BMI 37.95 kg/m2  SpO2 96%  Wt Readings from Last 3 Encounters:  11/16/15 207 lb 9 oz (94.15 kg)  08/15/15 193 lb 12.8 oz (87.907 kg)  05/30/15 188 lb (85.276 kg)   body mass index is 37.95 kg/(m^2).  Physical Exam  Constitutional: She appears well-developed and well-nourished. No distress.  Eyes: No scleral icterus.  Neck: No JVD present.  Cardiovascular: Normal rate and regular rhythm.   Pulmonary/Chest: Effort normal and breath sounds normal.  Abdominal: She exhibits no distension.  Musculoskeletal: She exhibits no edema.       Right foot: There is deformity (pes planus).       Left foot: There is deformity (pes planus).  Neurological: She is alert.  Skin: No pallor.  Psychiatric: She has a normal mood and affect.      Assessment & Plan:   Problem List Items Addressed This Visit      Respiratory   COPD, mild (HCC)   Relevant Medications   PROAIR HFA 108 (90 Base) MCG/ACT inhaler     Endocrine   Diabetic neuropathy (HCC)    Start gabapentin      Relevant Orders   Ambulatory referral to Podiatry     Musculoskeletal and Integument   Heel spur   Relevant Orders   Ambulatory referral to Podiatry   Ingrown toenail   Relevant Orders   Ambulatory referral to Podiatry   Plantar fasciitis of right foot   Relevant Orders   Ambulatory referral to Podiatry     Other   Chronic radicular lumbar pain   Relevant Medications    HYDROcodone-acetaminophen (NORCO) 10-325 MG tablet   Controlled substance agreement signed    Discussed risk of controlled substances including possible unintentional overdose, especially if mixed with alcohol or other pills; typical  speech given including illegal to share, even out of the goodness of patient's heart, always keep in the original bottle, safeguard medicine, do NOT mix with alcohol, other pain pills, "nerve" or anxiety pills, or sleeping pills; I am not obligated to approve of early refill or give new prescription if medicine is lost, stolen, or destroyed even with a police report, etc.; patient agrees with plan; controlled substance contract signed; copy of contract given to patient      History of GI bleed    Avoid oral NSAIDs      Relevant Orders   CBC with Differential/Platelet (Completed)   Hyperlipidemia LDL goal <100    Check lipids      Relevant Orders   Lipid Panel w/o Chol/HDL Ratio (Completed)   Medication monitoring encounter   Relevant Orders   Comprehensive metabolic panel (Completed)   Opiate use    Controlled substance agreement signed; has been on opiates from previous doctor; cannot take oral NSAIDs; suggested other means of pain relief; gabapentin, turmeric, tylenol; will have her see foot doctor; regular visits every 3 months for prescriptions      Pes planus of both feet   Relevant Orders   Ambulatory referral to Podiatry   Right ankle pain - Primary   Relevant Orders   Ambulatory referral to Podiatry   Uric acid (Completed)   Status post bariatric surgery (Chronic)    Check B12      Relevant Orders   Vitamin B12 (Completed)   VITAMIN D 25 Hydroxy (Vit-D Deficiency, Fractures) (Completed)    Other Visit Diagnoses    Weight gain, abnormal        see after visit summary; encouraged healthy eating, avoidance of empty calories, [email or web address] page on weight loss    Relevant Orders    TSH (Completed)    Obesity (BMI 30-39.9)             Follow up plan: Return in about 5 weeks (around 12/21/2015) for follow-up.  An after-visit summary was printed and given to the patient at check-out.  Please see the patient instructions which may contain other information and recommendations beyond what is mentioned above in the assessment and plan.  Meds ordered this encounter  Medications  . Coenzyme Q10 (COQ10) 100 MG CAPS    Sig: Take by mouth.  . DISCONTD: TURMERIC CURCUMIN PO    Sig: Take 1,300 capsules by mouth.  . Misc Natural Products (TURMERIC CURCUMIN) CAPS    Sig: Take one or two capsules by mouth daily (two capsules = 1300 mg)    Dispense:  1 capsule    Refill:  0  . DISCONTD: gabapentin (NEURONTIN) 300 MG capsule    Sig: Take one capsule twice a day for 3 days, then one capsule 3 times a day for 3 days    Dispense:  270 capsule    Refill:  0    Cancel 100 mg gabapentin  . HYDROcodone-acetaminophen (NORCO) 10-325 MG tablet    Sig: Take 0.5 tablets by mouth 2 (two) times daily as needed for severe pain.    Dispense:  30 tablet    Refill:  0  . PROAIR HFA 108 (90 Base) MCG/ACT inhaler    Sig: Inhale 1-2 puffs into the lungs every 6 (six) hours as needed for wheezing or shortness of breath.    Dispense:  54 g    Refill:  0    Orders Placed This Encounter  Procedures  . CBC with Differential/Platelet  .  Comprehensive metabolic panel  . Lipid Panel w/o Chol/HDL Ratio  . TSH  . Vitamin B12  . Uric acid  . VITAMIN D 25 Hydroxy (Vit-D Deficiency, Fractures)  . Ambulatory referral to Podiatry

## 2015-11-16 NOTE — Assessment & Plan Note (Signed)
Check lipids 

## 2015-11-19 ENCOUNTER — Other Ambulatory Visit: Payer: Self-pay | Admitting: Family Medicine

## 2015-11-19 MED ORDER — GABAPENTIN 300 MG PO CAPS: ORAL_CAPSULE | ORAL | Status: DC

## 2015-11-21 DIAGNOSIS — E785 Hyperlipidemia, unspecified: Secondary | ICD-10-CM | POA: Diagnosis not present

## 2015-11-21 DIAGNOSIS — Z5181 Encounter for therapeutic drug level monitoring: Secondary | ICD-10-CM | POA: Diagnosis not present

## 2015-11-21 DIAGNOSIS — M25571 Pain in right ankle and joints of right foot: Secondary | ICD-10-CM | POA: Diagnosis not present

## 2015-11-21 DIAGNOSIS — Z8719 Personal history of other diseases of the digestive system: Secondary | ICD-10-CM | POA: Diagnosis not present

## 2015-11-22 LAB — COMPREHENSIVE METABOLIC PANEL
ALT: 11 IU/L (ref 0–32)
AST: 14 IU/L (ref 0–40)
Albumin/Globulin Ratio: 1.6 (ref 1.2–2.2)
Albumin: 4.1 g/dL (ref 3.6–4.8)
Alkaline Phosphatase: 83 IU/L (ref 39–117)
BUN/Creatinine Ratio: 20 (ref 12–28)
BUN: 17 mg/dL (ref 8–27)
Bilirubin Total: 0.2 mg/dL (ref 0.0–1.2)
CO2: 23 mmol/L (ref 18–29)
Calcium: 9.2 mg/dL (ref 8.7–10.3)
Chloride: 100 mmol/L (ref 96–106)
Creatinine, Ser: 0.85 mg/dL (ref 0.57–1.00)
GFR calc Af Amer: 81 mL/min/{1.73_m2} (ref >59)
GFR calc non Af Amer: 71 mL/min/{1.73_m2} (ref >59)
Globulin, Total: 2.6 g/dL (ref 1.5–4.5)
Glucose: 119 mg/dL — ABNORMAL HIGH (ref 65–99)
Potassium: 4.5 mmol/L (ref 3.5–5.2)
Sodium: 142 mmol/L (ref 134–144)
Total Protein: 6.7 g/dL (ref 6.0–8.5)

## 2015-11-22 LAB — CBC WITH DIFFERENTIAL/PLATELET
Basophils Absolute: 0 10*3/uL (ref 0.0–0.2)
Basos: 1 %
EOS (ABSOLUTE): 0.3 10*3/uL (ref 0.0–0.4)
Eos: 4 %
Hematocrit: 39.7 % (ref 34.0–46.6)
Hemoglobin: 12.9 g/dL (ref 11.1–15.9)
Immature Grans (Abs): 0 10*3/uL (ref 0.0–0.1)
Immature Granulocytes: 0 %
Lymphocytes Absolute: 1.7 10*3/uL (ref 0.7–3.1)
Lymphs: 21 %
MCH: 26.7 pg (ref 26.6–33.0)
MCHC: 32.5 g/dL (ref 31.5–35.7)
MCV: 82 fL (ref 79–97)
Monocytes Absolute: 0.6 10*3/uL (ref 0.1–0.9)
Monocytes: 8 %
Neutrophils Absolute: 5.4 10*3/uL (ref 1.4–7.0)
Neutrophils: 66 %
Platelets: 226 10*3/uL (ref 150–379)
RBC: 4.84 x10E6/uL (ref 3.77–5.28)
RDW: 16.7 % — ABNORMAL HIGH (ref 12.3–15.4)
WBC: 8.1 10*3/uL (ref 3.4–10.8)

## 2015-11-22 LAB — LIPID PANEL W/O CHOL/HDL RATIO
Cholesterol, Total: 182 mg/dL (ref 100–199)
HDL: 44 mg/dL (ref >39)
LDL Calculated: 81 mg/dL (ref 0–99)
Triglycerides: 286 mg/dL — ABNORMAL HIGH (ref 0–149)
VLDL Cholesterol Cal: 57 mg/dL — ABNORMAL HIGH (ref 5–40)

## 2015-11-22 LAB — VITAMIN D 25 HYDROXY (VIT D DEFICIENCY, FRACTURES): Vit D, 25-Hydroxy: 43.2 ng/mL (ref 30.0–100.0)

## 2015-11-22 LAB — TSH: TSH: 0.636 u[IU]/mL (ref 0.450–4.500)

## 2015-11-22 LAB — VITAMIN B12: Vitamin B-12: 440 pg/mL (ref 211–946)

## 2015-11-22 LAB — URIC ACID: Uric Acid: 6.4 mg/dL (ref 2.5–7.1)

## 2015-11-25 ENCOUNTER — Other Ambulatory Visit: Payer: Self-pay | Admitting: Family Medicine

## 2015-11-25 DIAGNOSIS — E119 Type 2 diabetes mellitus without complications: Secondary | ICD-10-CM

## 2015-11-25 NOTE — Assessment & Plan Note (Signed)
Due for urine microalbumin:creatinine, get next visit

## 2015-12-12 NOTE — Assessment & Plan Note (Addendum)
Controlled substance agreement signed; has been on opiates from previous doctor; cannot take oral NSAIDs; suggested other means of pain relief; gabapentin, turmeric, tylenol; will have her see foot doctor; regular visits every 3 months for prescriptions

## 2015-12-20 DIAGNOSIS — B351 Tinea unguium: Secondary | ICD-10-CM | POA: Diagnosis not present

## 2015-12-20 DIAGNOSIS — E1140 Type 2 diabetes mellitus with diabetic neuropathy, unspecified: Secondary | ICD-10-CM | POA: Diagnosis not present

## 2015-12-20 DIAGNOSIS — L851 Acquired keratosis [keratoderma] palmaris et plantaris: Secondary | ICD-10-CM | POA: Diagnosis not present

## 2015-12-20 DIAGNOSIS — M79671 Pain in right foot: Secondary | ICD-10-CM | POA: Diagnosis not present

## 2015-12-20 DIAGNOSIS — M25571 Pain in right ankle and joints of right foot: Secondary | ICD-10-CM | POA: Diagnosis not present

## 2015-12-21 ENCOUNTER — Ambulatory Visit: Payer: Medicare Other | Admitting: Family Medicine

## 2015-12-22 ENCOUNTER — Encounter: Payer: Self-pay | Admitting: Family Medicine

## 2015-12-22 ENCOUNTER — Ambulatory Visit (INDEPENDENT_AMBULATORY_CARE_PROVIDER_SITE_OTHER): Payer: Medicare Other | Admitting: Family Medicine

## 2015-12-22 VITALS — HR 68 | Temp 98.0°F | Resp 16 | Wt 207.0 lb

## 2015-12-22 DIAGNOSIS — E781 Pure hyperglyceridemia: Secondary | ICD-10-CM

## 2015-12-22 DIAGNOSIS — J449 Chronic obstructive pulmonary disease, unspecified: Secondary | ICD-10-CM | POA: Diagnosis not present

## 2015-12-22 DIAGNOSIS — M2142 Flat foot [pes planus] (acquired), left foot: Secondary | ICD-10-CM

## 2015-12-22 DIAGNOSIS — Z8719 Personal history of other diseases of the digestive system: Secondary | ICD-10-CM | POA: Diagnosis not present

## 2015-12-22 DIAGNOSIS — M2141 Flat foot [pes planus] (acquired), right foot: Secondary | ICD-10-CM

## 2015-12-22 DIAGNOSIS — M5416 Radiculopathy, lumbar region: Secondary | ICD-10-CM

## 2015-12-22 DIAGNOSIS — G8929 Other chronic pain: Secondary | ICD-10-CM | POA: Diagnosis not present

## 2015-12-22 DIAGNOSIS — E1142 Type 2 diabetes mellitus with diabetic polyneuropathy: Secondary | ICD-10-CM

## 2015-12-22 DIAGNOSIS — F418 Other specified anxiety disorders: Secondary | ICD-10-CM | POA: Diagnosis not present

## 2015-12-22 DIAGNOSIS — M2550 Pain in unspecified joint: Secondary | ICD-10-CM

## 2015-12-22 MED ORDER — HYDROCODONE-ACETAMINOPHEN 10-325 MG PO TABS: 0.5000 | ORAL_TABLET | Freq: Two times a day (BID) | ORAL | Status: DC | PRN

## 2015-12-22 MED ORDER — PANTOPRAZOLE SODIUM 40 MG PO TBEC: 40.0000 mg | DELAYED_RELEASE_TABLET | Freq: Every day | ORAL | Status: DC

## 2015-12-22 MED ORDER — GABAPENTIN 300 MG PO CAPS: 300.0000 mg | ORAL_CAPSULE | Freq: Three times a day (TID) | ORAL | Status: DC

## 2015-12-22 MED ORDER — SERTRALINE HCL 100 MG PO TABS: 100.0000 mg | ORAL_TABLET | Freq: Every day | ORAL | Status: DC

## 2015-12-22 MED ORDER — METOPROLOL SUCCINATE ER 25 MG PO TB24: 25.0000 mg | ORAL_TABLET | Freq: Every day | ORAL | Status: DC

## 2015-12-22 MED ORDER — LISINOPRIL 10 MG PO TABS: 10.0000 mg | ORAL_TABLET | Freq: Every day | ORAL | Status: DC

## 2015-12-22 NOTE — Patient Instructions (Addendum)
Please call us back with the name of the company who sent you the [employer] Return on August 15th or just after and come fasting

## 2015-12-22 NOTE — Progress Notes (Signed)
Pulse 68  Temp(Src) 98 F (36.7 C) (Oral)  Resp 16  Wt 207 lb (93.895 kg)  SpO2 94%  MD note: 122/72 per patient  Subjective:    Patient ID: Andrea Holden, female    DOB: Apr 02, 1946, 70 y.o.   MRN: [id number]  HPI: Andrea Holden is a 70 y.o. female  Chief Complaint  Patient presents with  . Follow-up    5 weeks   Patient is here for follow-up; she has a new medicine with her today, Spiriva; I don't have any record of prescribing this for her; she says she wanted another inhaler for her breathing She was sent a 3 month supply through the mail She went on line and contacted the company; never had a prescription for this She doesn't remember who the company is She was having some bronchitis at night when allergies are really flaring; needed some kind of inhaler and she checked with companies and found this one  Diabetic neuropathy; now taking gabapentin 3 times a day; referred to podiatrist and went on Tuesday; it didn't go well; he was very nice, they did five xrays of the foot; pes planus; it will require major surgery with fusion, concern about infection with that; he ordered her an [place] brace; he is also treating the nails, heel spur, plantar fasciitis  Controlled substance agreement signed last visit; takes a half of a pill in the morning and half at night; rarely takes a half pill in the morning  On sertraline, wants to continue the same dose, working well  She has contacted breast imaging and has studies coming up  On pantoprazole; had bleeding ulcers, had 5 pints of blood last year; almost lost her she was told   Depression screen [facility] 2/9 12/22/2015 08/15/2015 03/16/2015 12/14/2014  Decreased Interest 0 0 0 0  Down, Depressed, Hopeless 1 0 0 1  PHQ - 2 Score 1 0 0 1   Relevant past medical, surgical, family and social history reviewed Past Medical History  Diagnosis Date  . Diabetes mellitus   . Hypertension   . Allergy     seasonal allergies  . Asthma      allergy induced asthma  . Anemia   . Arthritis   . Anxiety   . Cataract   . Depression   . GERD (gastroesophageal reflux disease)   . Irregular heartbeat   . Degenerative disc disease, lumbar   . Clotting disorder (HCC)     history of blood clots  . Hyperlipidemia   . H/O eating disorder   . Degenerative disc disease, lumbar   . History of chicken pox   . History of measles, mumps, or rubella   . H/O transfusion     patient was given 5 units of blood while at [facility], blood type O+  . Moderate COPD (chronic obstructive pulmonary disease) (HCC) 12/14/2014  . Complication of anesthesia     arrhythmia following colonoscopy  . Dysrhythmia   . Neuropathy (HCC)   . History of hiatal hernia   . HOH (hard of hearing)   . Orthopnea   . Wheezing   . Pes planus of both feet 11/16/2015  . Plantar fasciitis of right foot 11/16/2015  . Diabetic neuropathy (HCC) 11/16/2015  . Opiate use 11/16/2015   Past Surgical History  Procedure Laterality Date  . Ovary surgery    . Tonsillectomy    . Abdominal hysterectomy    . Gallbladder surgery    . Mouth  surgery    . Internal bleeding  2016  . Gastric bypass  2010  . Cholecystectomy    . Colon surgery      blocked colon  . Cataract extraction w/phaco Right 05/30/2015    Procedure: CATARACT EXTRACTION PHACO AND INTRAOCULAR LENS PLACEMENT (IOC);  Surgeon: Galen Manila, MD;  Location: [facility] ORS;  Service: Ophthalmology;  Laterality: Right;  [place] 00:57 AP% 20.9 CDE 11.99 fluid pack lot #[id number]   Family History  Problem Relation Age of Onset  . Asthma Mother   . COPD Mother   . Arthritis Father   . Depression Father   . Heart disease Father   . Hypertension Father   . Stroke Father   . Arthritis Brother   . Depression Brother   . Diabetes Brother   . Heart disease Brother   . Hyperlipidemia Brother   . Hypertension Brother   . Stroke Brother   . Vision loss Brother   . Diabetes Maternal Grandmother   . Breast cancer Neg Hx     Social History  Substance Use Topics  . Smoking status: Current Some Day Smoker    Types: Cigarettes    Last Attempt to Quit: 10/03/2007  . Smokeless tobacco: Never Used  . Alcohol Use: 0.0 oz/week    0 Standard drinks or equivalent per week     Comment: OCCASIONALLY   Interim medical history since last visit reviewed. Allergies and medications reviewed  Review of Systems Per HPI unless specifically indicated above     Objective:    Pulse 68  Temp(Src) 98 F (36.7 C) (Oral)  Resp 16  Wt 207 lb (93.895 kg)  SpO2 94%  Wt Readings from Last 3 Encounters:  12/22/15 207 lb (93.895 kg)  11/16/15 207 lb 9 oz (94.15 kg)  08/15/15 193 lb 12.8 oz (87.907 kg)   body mass index is 37.85 kg/(m^2).  Physical Exam  Constitutional: She appears well-developed and well-nourished. No distress.  Eyes: No scleral icterus.  Neck: No JVD present.  Cardiovascular: Normal rate and regular rhythm.   Pulmonary/Chest: Effort normal and breath sounds normal.  Abdominal: She exhibits no distension.  Musculoskeletal: She exhibits no edema.       Right foot: There is deformity (pes planus).       Left foot: There is deformity (pes planus).  Neurological: She is alert.  Skin: No pallor.  Psychiatric: She has a normal mood and affect.   Results for orders placed or performed in visit on 11/16/15  CBC with Differential/Platelet  Result Value Ref Range   WBC 8.1 3.4 - 10.8 x10E3/uL   RBC 4.84 3.77 - 5.28 x10E6/uL   Hemoglobin 12.9 11.1 - 15.9 g/dL   Hematocrit 45.4 09.8 - 46.6 %   MCV 82 79 - 97 fL   MCH 26.7 26.6 - 33.0 pg   MCHC 32.5 31.5 - 35.7 g/dL   RDW 11.9 (H) 14.7 - 82.9 %   Platelets 226 150 - 379 x10E3/uL   Neutrophils 66 %   Lymphs 21 %   Monocytes 8 %   Eos 4 %   Basos 1 %   Neutrophils Absolute 5.4 1.4 - 7.0 x10E3/uL   Lymphocytes Absolute 1.7 0.7 - 3.1 x10E3/uL   Monocytes Absolute 0.6 0.1 - 0.9 x10E3/uL   EOS (ABSOLUTE) 0.3 0.0 - 0.4 x10E3/uL   Basophils Absolute 0.0  0.0 - 0.2 x10E3/uL   Immature Granulocytes 0 %   Immature Grans (Abs) 0.0 0.0 - 0.1 x10E3/uL  Comprehensive metabolic panel  Result Value Ref Range   Glucose 119 (H) 65 - 99 mg/dL   BUN 17 8 - 27 mg/dL   Creatinine, Ser 1.610.85 0.57 - 1.00 mg/dL   GFR calc non Af Amer 71 >59 mL/min/1.73   GFR calc Af Amer 81 >59 mL/min/1.73   BUN/Creatinine Ratio 20 12 - 28   Sodium 142 134 - 144 mmol/L   Potassium 4.5 3.5 - 5.2 mmol/L   Chloride 100 96 - 106 mmol/L   CO2 23 18 - 29 mmol/L   Calcium 9.2 8.7 - 10.3 mg/dL   Total Protein 6.7 6.0 - 8.5 g/dL   Albumin 4.1 3.6 - 4.8 g/dL   Globulin, Total 2.6 1.5 - 4.5 g/dL   Albumin/Globulin Ratio 1.6 1.2 - 2.2   Bilirubin Total 0.2 0.0 - 1.2 mg/dL   Alkaline Phosphatase 83 39 - 117 IU/L   AST 14 0 - 40 IU/L   ALT 11 0 - 32 IU/L  Lipid Panel w/o Chol/HDL Ratio  Result Value Ref Range   Cholesterol, Total 182 100 - 199 mg/dL   Triglycerides 096286 (H) 0 - 149 mg/dL   HDL 44 >04>39 mg/dL   VLDL Cholesterol Cal 57 (H) 5 - 40 mg/dL   LDL Calculated 81 0 - 99 mg/dL  TSH  Result Value Ref Range   TSH 0.636 0.450 - 4.500 uIU/mL  Vitamin B12  Result Value Ref Range   Vitamin B-12 440 211 - 946 pg/mL  Uric acid  Result Value Ref Range   Uric Acid 6.4 2.5 - 7.1 mg/dL  VITAMIN D 25 Hydroxy (Vit-D Deficiency, Fractures)  Result Value Ref Range   Vit D, 25-Hydroxy 43.2 30.0 - 100.0 ng/mL      Assessment & Plan:   Problem List Items Addressed This Visit      Respiratory   COPD, mild (HCC)    I'm very uncomfortable with the fact that the patient just called around to companies on her own to get another inhaler; I asked her to please contact us if issues with breathing; I cannot find any record of a prescription for this medicine; I explained to the patient that this requires a prescription; patients just can't order cholesterol medicine or blood pressure or breathing medicines from companies because they think they need something; I asked the patient to please  find out where she got it because I would like that information and want to contact the company        Other   Chronic pain of multiple joints    On hydrocodone; has controlled substance contract signed; return in two months for next visit, next prescriptions; okay for additional refill before next visit if needed as long as UDS is consistent      Relevant Medications   gabapentin (NEURONTIN) 300 MG capsule   sertraline (ZOLOFT) 100 MG tablet   HYDROcodone-acetaminophen (NORCO) 10-325 MG tablet   Chronic radicular lumbar pain - Primary   Relevant Medications   HYDROcodone-acetaminophen (NORCO) 10-325 MG tablet   High triglycerides    Reviewed last TG; patient instructed to use krill oil      Relevant Medications   metoprolol succinate (TOPROL-XL) 25 MG 24 hr tablet   lisinopril (PRINIVIL,ZESTRIL) 10 MG tablet   History of GI bleed    Will continue the PPI for now; suffered significant upper GI bleed last year, so I'll leave this going for another 2 months, then readdress, consider tapering,transitioning to H2 blocker  Pes planus of both feet    Patient managed by podiatrist         Follow up plan: Return in about 8 weeks (around 02/13/2016) for fasting labs and visit.  An after-visit summary was printed and given to the patient at check-out.  Please see the patient instructions which may contain other information and recommendations beyond what is mentioned above in the assessment and plan.  Meds ordered this encounter  Medications  . gabapentin (NEURONTIN) 300 MG capsule    Sig: Take 1 capsule (300 mg total) by mouth 3 (three) times daily.    Dispense:  270 capsule    Refill:  1  . sertraline (ZOLOFT) 100 MG tablet    Sig: Take 1 tablet (100 mg total) by mouth daily.    Dispense:  90 tablet    Refill:  1  . pantoprazole (PROTONIX) 40 MG tablet    Sig: Take 1 tablet (40 mg total) by mouth daily. Caution:prolonged use may increase risk of pneumonia, colitis,  osteoporosis, anemia    Dispense:  90 tablet    Refill:  0  . metoprolol succinate (TOPROL-XL) 25 MG 24 hr tablet    Sig: Take 1 tablet (25 mg total) by mouth daily.    Dispense:  90 tablet    Refill:  1  . lisinopril (PRINIVIL,ZESTRIL) 10 MG tablet    Sig: Take 1 tablet (10 mg total) by mouth daily.    Dispense:  90 tablet    Refill:  1  . HYDROcodone-acetaminophen (NORCO) 10-325 MG tablet    Sig: Take 0.5 tablets by mouth 2 (two) times daily as needed for severe pain.    Dispense:  30 tablet    Refill:  0    Controlled substance contract   After I had already left the room and completed the visit, patient came back out into the doorway and explained that she had been having some side pain; I asked her to please schedule another appointment, our time had run out for the day; I wish she had brought it up sooner, but we need to bring her back in for another complaint

## 2015-12-24 DIAGNOSIS — E781 Pure hyperglyceridemia: Secondary | ICD-10-CM | POA: Insufficient documentation

## 2015-12-24 DIAGNOSIS — F418 Other specified anxiety disorders: Secondary | ICD-10-CM | POA: Insufficient documentation

## 2015-12-24 NOTE — Assessment & Plan Note (Signed)
Will continue the PPI for now; suffered significant upper GI bleed last year, so I'll leave this going for another 2 months, then readdress, consider tapering,transitioning to H2 blocker

## 2015-12-24 NOTE — Assessment & Plan Note (Signed)
Reviewed last TG; patient instructed to use krill oil

## 2015-12-24 NOTE — Assessment & Plan Note (Signed)
On hydrocodone; has controlled substance contract signed; return in two months for next visit, next prescriptions; okay for additional refill before next visit if needed as long as UDS is consistent

## 2015-12-24 NOTE — Assessment & Plan Note (Signed)
I'm very uncomfortable with the fact that the patient just called around to companies on her own to get another inhaler; I asked her to please contact us if issues with breathing; I cannot find any record of a prescription for this medicine; I explained to the patient that this requires a prescription; patients just can't order cholesterol medicine or blood pressure or breathing medicines from companies because they think they need something; I asked the patient to please find out where she got it because I would like that information and want to contact the company

## 2015-12-24 NOTE — Assessment & Plan Note (Signed)
Patient managed by podiatrist

## 2015-12-24 NOTE — Assessment & Plan Note (Signed)
Patient wishes to continue with the sertraline; refills provided

## 2015-12-24 NOTE — Assessment & Plan Note (Signed)
Continue gabapentin; refills provided

## 2015-12-25 ENCOUNTER — Other Ambulatory Visit: Payer: Self-pay | Admitting: Family Medicine

## 2015-12-25 ENCOUNTER — Telehealth: Payer: Self-pay

## 2015-12-25 DIAGNOSIS — R928 Other abnormal and inconclusive findings on diagnostic imaging of breast: Secondary | ICD-10-CM

## 2015-12-25 NOTE — Assessment & Plan Note (Signed)
ordered

## 2015-12-25 NOTE — Assessment & Plan Note (Signed)
Pt called, was told she needed US too; ordering, but with notes to NOT do if not needed

## 2015-12-25 NOTE — Telephone Encounter (Signed)
Pt needs orders for mammogram

## 2015-12-25 NOTE — Telephone Encounter (Signed)
done

## 2016-01-22 DIAGNOSIS — H2512 Age-related nuclear cataract, left eye: Secondary | ICD-10-CM | POA: Diagnosis not present

## 2016-01-22 LAB — HM DIABETES EYE EXAM

## 2016-01-29 ENCOUNTER — Ambulatory Visit
Admission: RE | Admit: 2016-01-29 | Discharge: 2016-01-29 | Disposition: A | Discharge status: Home / Self Care | Payer: Medicare Other | Source: Ambulatory Visit | Attending: Family Medicine | Admitting: Family Medicine

## 2016-01-29 DIAGNOSIS — R921 Mammographic calcification found on diagnostic imaging of breast: Secondary | ICD-10-CM | POA: Insufficient documentation

## 2016-01-29 DIAGNOSIS — R928 Other abnormal and inconclusive findings on diagnostic imaging of breast: Secondary | ICD-10-CM | POA: Insufficient documentation

## 2016-01-29 DIAGNOSIS — N6489 Other specified disorders of breast: Secondary | ICD-10-CM | POA: Diagnosis not present

## 2016-01-29 DIAGNOSIS — R590 Localized enlarged lymph nodes: Secondary | ICD-10-CM | POA: Diagnosis not present

## 2016-01-30 ENCOUNTER — Other Ambulatory Visit: Payer: Self-pay | Admitting: Family Medicine

## 2016-01-30 DIAGNOSIS — R921 Mammographic calcification found on diagnostic imaging of breast: Secondary | ICD-10-CM

## 2016-01-30 DIAGNOSIS — N6310 Unspecified lump in the right breast, unspecified quadrant: Secondary | ICD-10-CM

## 2016-02-09 ENCOUNTER — Ambulatory Visit
Admission: RE | Admit: 2016-02-09 | Discharge: 2016-02-09 | Disposition: A | Discharge status: Home / Self Care | Payer: Medicare Other | Source: Ambulatory Visit | Attending: Family Medicine | Admitting: Family Medicine

## 2016-02-09 ENCOUNTER — Other Ambulatory Visit: Payer: Self-pay | Admitting: Family Medicine

## 2016-02-09 DIAGNOSIS — N6310 Unspecified lump in the right breast, unspecified quadrant: Secondary | ICD-10-CM

## 2016-02-09 DIAGNOSIS — R921 Mammographic calcification found on diagnostic imaging of breast: Secondary | ICD-10-CM

## 2016-02-09 MED ORDER — VARENICLINE TARTRATE 1 MG PO TABS: 1.0000 mg | ORAL_TABLET | Freq: Two times a day (BID) | ORAL | 2 refills | Status: DC

## 2016-02-09 MED ORDER — VARENICLINE TARTRATE 0.5 MG X 11 & 1 MG X 42 PO MISC: ORAL | 0 refills | Status: DC

## 2016-02-09 NOTE — Progress Notes (Signed)
chantix Rx for forms; ready

## 2016-02-12 ENCOUNTER — Other Ambulatory Visit: Payer: Self-pay | Admitting: Family Medicine

## 2016-02-12 DIAGNOSIS — R921 Mammographic calcification found on diagnostic imaging of breast: Secondary | ICD-10-CM

## 2016-02-12 DIAGNOSIS — N6310 Unspecified lump in the right breast, unspecified quadrant: Secondary | ICD-10-CM

## 2016-02-13 ENCOUNTER — Ambulatory Visit
Admission: RE | Admit: 2016-02-13 | Discharge: 2016-02-13 | Disposition: A | Discharge status: Home / Self Care | Payer: Medicare Other | Source: Ambulatory Visit | Attending: Family Medicine | Admitting: Family Medicine

## 2016-02-13 DIAGNOSIS — R921 Mammographic calcification found on diagnostic imaging of breast: Secondary | ICD-10-CM

## 2016-02-13 DIAGNOSIS — R590 Localized enlarged lymph nodes: Secondary | ICD-10-CM | POA: Diagnosis not present

## 2016-02-13 DIAGNOSIS — N6310 Unspecified lump in the right breast, unspecified quadrant: Secondary | ICD-10-CM

## 2016-02-13 DIAGNOSIS — N63 Unspecified lump in breast: Secondary | ICD-10-CM | POA: Diagnosis not present

## 2016-02-22 ENCOUNTER — Telehealth: Payer: Self-pay | Admitting: Family Medicine

## 2016-02-22 ENCOUNTER — Ambulatory Visit: Payer: Medicare Other | Admitting: Family Medicine

## 2016-02-22 DIAGNOSIS — E119 Type 2 diabetes mellitus without complications: Secondary | ICD-10-CM

## 2016-02-22 DIAGNOSIS — E785 Hyperlipidemia, unspecified: Secondary | ICD-10-CM

## 2016-02-22 DIAGNOSIS — E781 Pure hyperglyceridemia: Secondary | ICD-10-CM

## 2016-02-22 DIAGNOSIS — Z5181 Encounter for therapeutic drug level monitoring: Secondary | ICD-10-CM

## 2016-02-22 NOTE — Telephone Encounter (Signed)
Patient was scheduled to see Dr. Sherie DonLada on today, 02/22/16, but had to reschedule due to provider being out of the office.  Patient has been rescheduled to come in on 02/28/16 @ 11:20am and patient wanted to come in early to have her fasting blood work done before she sees Dr. Sherie DonLada.  Patient is requesting to have a lab order ready for her in order have done prior to seeing provider.

## 2016-02-23 NOTE — Telephone Encounter (Signed)
Orders entered thank you

## 2016-02-23 NOTE — Assessment & Plan Note (Signed)
Check lipid panel  

## 2016-02-23 NOTE — Assessment & Plan Note (Signed)
Check A1c, glucose, urine microalbumin

## 2016-02-23 NOTE — Assessment & Plan Note (Signed)
Check cmp 

## 2016-02-28 ENCOUNTER — Encounter: Payer: Self-pay | Admitting: Family Medicine

## 2016-02-28 ENCOUNTER — Ambulatory Visit (INDEPENDENT_AMBULATORY_CARE_PROVIDER_SITE_OTHER): Payer: Medicare Other | Admitting: Family Medicine

## 2016-02-28 VITALS — HR 68 | Temp 98.3°F | Resp 16 | Wt 210.0 lb

## 2016-02-28 DIAGNOSIS — E785 Hyperlipidemia, unspecified: Secondary | ICD-10-CM | POA: Diagnosis not present

## 2016-02-28 DIAGNOSIS — Z5181 Encounter for therapeutic drug level monitoring: Secondary | ICD-10-CM

## 2016-02-28 DIAGNOSIS — E119 Type 2 diabetes mellitus without complications: Secondary | ICD-10-CM

## 2016-02-28 DIAGNOSIS — Z9884 Bariatric surgery status: Secondary | ICD-10-CM

## 2016-02-28 DIAGNOSIS — J449 Chronic obstructive pulmonary disease, unspecified: Secondary | ICD-10-CM | POA: Diagnosis not present

## 2016-02-28 DIAGNOSIS — E1142 Type 2 diabetes mellitus with diabetic polyneuropathy: Secondary | ICD-10-CM

## 2016-02-28 DIAGNOSIS — M2550 Pain in unspecified joint: Secondary | ICD-10-CM

## 2016-02-28 DIAGNOSIS — E781 Pure hyperglyceridemia: Secondary | ICD-10-CM

## 2016-02-28 DIAGNOSIS — G8929 Other chronic pain: Secondary | ICD-10-CM

## 2016-02-28 DIAGNOSIS — Z23 Encounter for immunization: Secondary | ICD-10-CM

## 2016-02-28 DIAGNOSIS — I10 Essential (primary) hypertension: Secondary | ICD-10-CM | POA: Diagnosis not present

## 2016-02-28 DIAGNOSIS — M5416 Radiculopathy, lumbar region: Secondary | ICD-10-CM

## 2016-02-28 MED ORDER — HYDROCODONE-ACETAMINOPHEN 10-325 MG PO TABS: 0.5000 | ORAL_TABLET | Freq: Two times a day (BID) | ORAL | 0 refills | Status: DC | PRN

## 2016-02-28 NOTE — Patient Instructions (Addendum)
You received the flu shot today; it should protect you against the flu virus over the coming months; it will take about two weeks for antibodies to develop; do try to stay away from hospitals, nursing homes, and daycares during peak flu season; taking extra vitamin C daily during flu season may help you avoid getting sick  Once you start the Chantix, pick day 8 as your quit date Really change up your habits and treat yourself Avoid hand to mouth with munchies  Smoking Cessation, Tips for Success If you are ready to quit smoking, congratulations! You have chosen to help yourself be healthier. Cigarettes bring nicotine, tar, carbon monoxide, and other irritants into your body. Your lungs, heart, and blood vessels will be able to work better without these poisons. There are many different ways to quit smoking. Nicotine gum, nicotine patches, a nicotine inhaler, or nicotine nasal spray can help with physical craving. Hypnosis, support groups, and medicines help break the habit of smoking. WHAT THINGS CAN I DO TO MAKE QUITTING EASIER?  Here are some tips to help you quit for good:  Pick a date when you will quit smoking completely. Tell all of your friends and family about your plan to quit on that date.  Do not try to slowly cut down on the number of cigarettes you are smoking. Pick a quit date and quit smoking completely starting on that day.  Throw away all cigarettes.   Clean and remove all ashtrays from your home, work, and car.  On a card, write down your reasons for quitting. Carry the card with you and read it when you get the urge to smoke.  Cleanse your body of nicotine. Drink enough water and fluids to keep your urine clear or pale yellow. Do this after quitting to flush the nicotine from your body.  Learn to predict your moods. Do not let a bad situation be your excuse to have a cigarette. Some situations in your life might tempt you into wanting a cigarette.  Never have "just one"  cigarette. It leads to wanting another and another. Remind yourself of your decision to quit.  Change habits associated with smoking. If you smoked while driving or when feeling stressed, try other activities to replace smoking. Stand up when drinking your coffee. Brush your teeth after eating. Sit in a different chair when you read the paper. Avoid alcohol while trying to quit, and try to drink fewer caffeinated beverages. Alcohol and caffeine may urge you to smoke.  Avoid foods and drinks that can trigger a desire to smoke, such as sugary or spicy foods and alcohol.  Ask people who smoke not to smoke around you.  Have something planned to do right after eating or having a cup of coffee. For example, plan to take a walk or exercise.  Try a relaxation exercise to calm you down and decrease your stress. Remember, you may be tense and nervous for the first 2 weeks after you quit, but this will pass.  Find new activities to keep your hands busy. Play with a pen, coin, or rubber band. Doodle or draw things on paper.  Brush your teeth right after eating. This will help cut down on the craving for the taste of tobacco after meals. You can also try mouthwash.   Use oral substitutes in place of cigarettes. Try using lemon drops, carrots, cinnamon sticks, or chewing gum. Keep them handy so they are available when you have the urge to smoke.  When  you have the urge to smoke, try deep breathing.  Designate your home as a nonsmoking area.  If you are a heavy smoker, ask your health care provider about a prescription for nicotine chewing gum. It can ease your withdrawal from nicotine.  Reward yourself. Set aside the cigarette money you save and buy yourself something nice.  Look for support from others. Join a support group or smoking cessation program. Ask someone at home or at work to help you with your plan to quit smoking.  Always ask yourself, "Do I need this cigarette or is this just a reflex?"  Tell yourself, "Today, I choose not to smoke," or "I do not want to smoke." You are reminding yourself of your decision to quit.  Do not replace cigarette smoking with electronic cigarettes (commonly called e-cigarettes). The safety of e-cigarettes is unknown, and some may contain harmful chemicals.  If you relapse, do not give up! Plan ahead and think about what you will do the next time you get the urge to smoke. HOW WILL I FEEL WHEN I QUIT SMOKING? You may have symptoms of withdrawal because your body is used to nicotine (the addictive substance in cigarettes). You may crave cigarettes, be irritable, feel very hungry, cough often, get headaches, or have difficulty concentrating. The withdrawal symptoms are only temporary. They are strongest when you first quit but will go away within 10-14 days. When withdrawal symptoms occur, stay in control. Think about your reasons for quitting. Remind yourself that these are signs that your body is healing and getting used to being without cigarettes. Remember that withdrawal symptoms are easier to treat than the major diseases that smoking can cause.  Even after the withdrawal is over, expect periodic urges to smoke. However, these cravings are generally short lived and will go away whether you smoke or not. Do not smoke! WHAT RESOURCES ARE AVAILABLE TO HELP ME QUIT SMOKING? Your health care provider can direct you to community resources or hospitals for support, which may include:  Group support.  Education.  Hypnosis.  Therapy.   This information is not intended to replace advice given to you by your health care provider. Make sure you discuss any questions you have with your health care provider.   Document Released: 03/15/2004 Document Revised: 07/08/2014 Document Reviewed: 12/03/2012 Else[employer] Interactive Patient Education [employer]2016 [employer]

## 2016-02-28 NOTE — Progress Notes (Signed)
Pulse 68   Temp 98.3 F (36.8 C) (Oral)   Resp 16   Wt 210 lb (95.3 kg)   SpO2 94%   BMI 38.41 kg/m   MD: 116/78 BP  Subjective:    Patient ID: Andrea Holden, female    DOB: December 04, 1945, 70 y.o.   MRN: [id number]  HPI: Andrea Holden is a 70 y.o. female  Chief Complaint  Patient presents with  . Follow-up   She wants to talk about stem cell research; "they" did a complete work-up of her hips and back and elsehwere, [facility] [place]; she says that they have surgeons there with the chiropractor; it is something she has researched; I explained immediately that I cannot give any advice or counseling about this; they have neuropathy therapy program she says too  She started a new organic marijuana-derivative medicine without the THC in it and she does not hurt as much; can walk better; more energetic; this is something she ordered online; they give you liquid and capsule she says; I also cannot give any advice on that  She had the breast biopsy; they put a clip in it and it was benign; no family hx of breast cancer; so hard to get the first time and had to go to [facility]; calcium cyst  Path report 02/14/16 reviewed with patient ADDENDUM: Pathology revealed BENIGN REACTIVE LYMPH NODE WITH CALCIFICATIONS of the low right axilla. This was found to be concordant by Dr. Quincy Carnes. Return in 4 months for bilateral screening mammo  She wants to stop smoking; needs form completion for patient assistance; motivation scale is 99 out of 100 to quit; smoking now 1 ppd; earlist cig of the day is after coffee and voiding; hitting the chair with coffee is first trigger; next cig of the day one hour later in watching TV or getting ready; talked about habits; last cig of the day is minutes before bed  Diabetes is doing well; labs drawn today; over 5 years since and A1c is down; had gastric bypass; she had just gone to being insulin dependent and fought to get surgery,  now off insulin and all orals; having some foot numbness  Depression screen [facility] 2/9 02/28/2016 12/22/2015 08/15/2015 03/16/2015 12/14/2014  Decreased Interest 0 0 0 0 0  Down, Depressed, Hopeless 1 1 0 0 1  PHQ - 2 Score 1 1 0 0 1   Relevant past medical, surgical, family and social history reviewed Past Medical History:  Diagnosis Date  . Allergy    seasonal allergies  . Anemia   . Anxiety   . Arthritis   . Asthma    allergy induced asthma  . Cataract   . Clotting disorder (HCC)    history of blood clots  . Complication of anesthesia    arrhythmia following colonoscopy  . Degenerative disc disease, lumbar   . Degenerative disc disease, lumbar   . Depression   . Diabetes mellitus   . Diabetic neuropathy (HCC) 11/16/2015  . Dysrhythmia   . GERD (gastroesophageal reflux disease)   . H/O eating disorder   . H/O transfusion    patient was given 5 units of blood while at [facility], blood type O+  . History of chicken pox   . History of hiatal hernia   . History of measles, mumps, or rubella   . HOH (hard of hearing)   . Hyperlipidemia   . Hypertension   . Irregular heartbeat   . Moderate  COPD (chronic obstructive pulmonary disease) (HCC) 12/14/2014  . Neuropathy (HCC)   . Opiate use 11/16/2015  . Orthopnea   . Pes planus of both feet 11/16/2015  . Plantar fasciitis of right foot 11/16/2015  . Wheezing    Past Surgical History:  Procedure Laterality Date  . ABDOMINAL HYSTERECTOMY    . CATARACT EXTRACTION W/PHACO Right 05/30/2015   Procedure: CATARACT EXTRACTION PHACO AND INTRAOCULAR LENS PLACEMENT (IOC);  Surgeon: Galen Manila, MD;  Location: [facility] ORS;  Service: Ophthalmology;  Laterality: Right;  [place] 00:57 AP% 20.9 CDE 11.99 fluid pack lot #[id number]  . CHOLECYSTECTOMY    . COLON SURGERY     blocked colon  . GALLBLADDER SURGERY    . GASTRIC BYPASS  2010  . internal bleeding  2016  . MOUTH SURGERY    . OVARY SURGERY    . TONSILLECTOMY     Family History  Problem  Relation Age of Onset  . Asthma Mother   . COPD Mother   . Arthritis Father   . Depression Father   . Heart disease Father   . Hypertension Father   . Stroke Father   . Arthritis Brother   . Depression Brother   . Diabetes Brother   . Heart disease Brother   . Hyperlipidemia Brother   . Hypertension Brother   . Stroke Brother   . Vision loss Brother   . Diabetes Maternal Grandmother   . Breast cancer Neg Hx    Social History  Substance Use Topics  . Smoking status: Current Some Day Smoker    Types: Cigarettes    Last attempt to quit: 10/03/2007  . Smokeless tobacco: Never Used  . Alcohol use 0.0 oz/week     Comment: OCCASIONALLY   Interim medical history since last visit reviewed. Allergies and medications reviewed  Review of Systems Per HPI unless specifically indicated above     Objective:    Pulse 68   Temp 98.3 F (36.8 C) (Oral)   Resp 16   Wt 210 lb (95.3 kg)   SpO2 94%   BMI 38.41 kg/m   Wt Readings from Last 3 Encounters:  02/28/16 210 lb (95.3 kg)  12/22/15 207 lb (93.9 kg)  11/16/15 207 lb 9 oz (94.1 kg)    Physical Exam  Constitutional: She appears well-developed and well-nourished. No distress.  HENT:  Head: Normocephalic and atraumatic.  Eyes: EOM are normal. No scleral icterus.  Neck: No thyromegaly present.  Cardiovascular: Normal rate, regular rhythm and normal heart sounds.   No murmur heard. Pulmonary/Chest: Effort normal and breath sounds normal. No respiratory distress. She has no wheezes.  Abdominal: Soft. Bowel sounds are normal. She exhibits no distension.  Musculoskeletal: Normal range of motion. She exhibits no edema.  Ambulatory with cane  Neurological: She is alert. She exhibits normal muscle tone.  Skin: Skin is warm and dry. She is not diaphoretic. No pallor.  Psychiatric: She has a normal mood and affect. Her behavior is normal. Judgment and thought content normal.      Assessment & Plan:   Problem List Items Addressed  This Visit      Cardiovascular and Mediastinum   Hypertension goal BP (blood pressure) < 140/90    Controlled, 116/78; continue medicine, DASH guidelines; weight loss would be helpful        Respiratory   COPD, mild (HCC)    Thankfully, she is actively pursuing smoking cessation, patient is hoping to get Chantix approved  Endocrine   Diabetic neuropathy (HCC)    Weight loss, healthier eating; monitor A1c every 6 months if under 7, every 3 months if 7 or higher      Diabetes mellitus without complication (HCC) - Primary    Check A1c and urine microalbumin        Other   Status post bariatric surgery (Chronic)    Patient has unfortunately regained weight      Medication monitoring encounter   Hyperlipidemia LDL goal <100    Healthier eating; goal LDL under 100; limit saturated fats      High triglycerides   Chronic radicular lumbar pain    Patient reports using medication obtained off the internet derived from marijuana but has NO THC in it; will get urine drug screen to verify      Relevant Medications   HYDROcodone-acetaminophen (NORCO) 10-325 MG tablet   Chronic pain of multiple joints    Weight loss would be helpful; continue gabapentin, turmeric, and prn hydrocodone/apap; controlled substance contract on the chart      Relevant Medications   HYDROcodone-acetaminophen (NORCO) 10-325 MG tablet    Other Visit Diagnoses    Needs flu shot       Relevant Orders   Flu vaccine HIGH DOSE PF (Fluzone High dose) (Completed)       Follow up plan: Return in about 6 weeks (around 04/10/2016) for smoking.  An after-visit summary was printed and given to the patient at check-out.  Please see the patient instructions which may contain other information and recommendations beyond what is mentioned above in the assessment and plan.  Meds ordered this encounter  Medications  . HYDROcodone-acetaminophen (NORCO) 10-325 MG tablet    Sig: Take 0.5 tablets by mouth 2  (two) times daily as needed for severe pain.    Dispense:  30 tablet    Refill:  0    Controlled substance contract    Orders Placed This Encounter  Procedures  . Flu vaccine HIGH DOSE PF (Fluzone High dose)      NCCSRS website reviewed

## 2016-03-01 ENCOUNTER — Other Ambulatory Visit: Payer: Self-pay | Admitting: Family Medicine

## 2016-03-01 ENCOUNTER — Telehealth: Payer: Self-pay | Admitting: Family Medicine

## 2016-03-01 ENCOUNTER — Other Ambulatory Visit
Admission: RE | Admit: 2016-03-01 | Discharge: 2016-03-01 | Disposition: A | Discharge status: Home / Self Care | Payer: Medicare Other | Source: Ambulatory Visit | Attending: Family Medicine | Admitting: Family Medicine

## 2016-03-01 DIAGNOSIS — E875 Hyperkalemia: Secondary | ICD-10-CM

## 2016-03-01 DIAGNOSIS — E871 Hypo-osmolality and hyponatremia: Secondary | ICD-10-CM

## 2016-03-01 LAB — BASIC METABOLIC PANEL
Anion gap: 8 (ref 5–15)
BUN: 23 mg/dL — ABNORMAL HIGH (ref 6–20)
CO2: 23 mmol/L (ref 22–32)
Calcium: 8.9 mg/dL (ref 8.9–10.3)
Chloride: 100 mmol/L — ABNORMAL LOW (ref 101–111)
Creatinine, Ser: 0.84 mg/dL (ref 0.44–1.00)
GFR calc Af Amer: >60 mL/min (ref >60)
GFR calc non Af Amer: >60 mL/min (ref >60)
Glucose, Bld: 125 mg/dL — ABNORMAL HIGH (ref 65–99)
Potassium: 4.7 mmol/L (ref 3.5–5.1)
Sodium: 131 mmol/L — ABNORMAL LOW (ref 135–145)

## 2016-03-01 LAB — COMPLETE METABOLIC PANEL WITH GFR
ALT: 8 U/L (ref 6–29)
AST: 16 U/L (ref 10–35)
Albumin: 4.2 g/dL (ref 3.6–5.1)
Alkaline Phosphatase: 65 U/L (ref 33–130)
BUN: 20 mg/dL (ref 7–25)
CO2: 22 mmol/L (ref 20–31)
Calcium: 9 mg/dL (ref 8.6–10.4)
Chloride: 97 mmol/L — ABNORMAL LOW (ref 98–110)
Creat: 0.91 mg/dL (ref 0.50–0.99)
GFR, Est African American: 74 mL/min (ref >=60)
GFR, Est Non African American: 65 mL/min (ref >=60)
Glucose, Bld: 87 mg/dL (ref 65–99)
Potassium: 5.9 mmol/L — ABNORMAL HIGH (ref 3.5–5.3)
Sodium: 134 mmol/L — ABNORMAL LOW (ref 135–146)
Total Bilirubin: 0.4 mg/dL (ref 0.2–1.2)
Total Protein: 6.7 g/dL (ref 6.1–8.1)

## 2016-03-01 LAB — LIPID PANEL
Cholesterol: 173 mg/dL (ref 125–200)
HDL: 45 mg/dL — ABNORMAL LOW (ref >=46)
LDL Cholesterol: 96 mg/dL (ref <130)
Total CHOL/HDL Ratio: 3.8 Ratio (ref <=5.0)
Triglycerides: 162 mg/dL — ABNORMAL HIGH (ref <150)
VLDL: 32 mg/dL — ABNORMAL HIGH (ref <30)

## 2016-03-01 LAB — MICROALBUMIN / CREATININE URINE RATIO
Creatinine, Urine: 108 mg/dL (ref 20–320)
Microalb Creat Ratio: 10 mcg/mg creat (ref <30)
Microalb, Ur: 1.1 mg/dL

## 2016-03-01 LAB — HEMOGLOBIN A1C
Hgb A1c MFr Bld: 6 % — ABNORMAL HIGH (ref <5.7)
Mean Plasma Glucose: 126 mg/dL

## 2016-03-01 NOTE — Telephone Encounter (Signed)
I spoke with patient K+ issue resolved; likely hemolysis, intracellular K+; normal now However, Na+ and Cl- low; she's not drinking excessive water; no diuretics Liberalize salt over weekend (last several BPs have been excellent) Recheck BMP on Wed Rhonda -- please fax order to [facility] on [address]

## 2016-03-01 NOTE — Assessment & Plan Note (Addendum)
Not on any diuretics; not fluid overloaded; check BMP on Wed

## 2016-03-01 NOTE — Assessment & Plan Note (Signed)
Stat recheck

## 2016-03-02 NOTE — Assessment & Plan Note (Addendum)
Thankfully, she is actively pursuing smoking cessation, patient is hoping to get Chantix approved

## 2016-03-02 NOTE — Assessment & Plan Note (Signed)
Weight loss, healthier eating; monitor A1c every 6 months if under 7, every 3 months if 7 or higher

## 2016-03-02 NOTE — Assessment & Plan Note (Signed)
Controlled, 116/78; continue medicine, DASH guidelines; weight loss would be helpful

## 2016-03-02 NOTE — Assessment & Plan Note (Signed)
Patient has unfortunately regained weight

## 2016-03-02 NOTE — Assessment & Plan Note (Signed)
Healthier eating; goal LDL under 100; limit saturated fats

## 2016-03-02 NOTE — Assessment & Plan Note (Signed)
Weight loss would be helpful; continue gabapentin, turmeric, and prn hydrocodone/apap; controlled substance contract on the chart

## 2016-03-02 NOTE — Assessment & Plan Note (Signed)
Patient reports using medication obtained off the internet derived from marijuana but has NO THC in it; will get urine drug screen to verify

## 2016-03-02 NOTE — Assessment & Plan Note (Signed)
Check A1c and urine microalbumin. 

## 2016-03-05 NOTE — Telephone Encounter (Signed)
faxed

## 2016-03-05 NOTE — Telephone Encounter (Signed)
DO NOT UNDERSTAND ANY OF THIS. DO NOT KNOW WHAT IT IS I AM TO FAX EITHER.

## 2016-03-07 ENCOUNTER — Other Ambulatory Visit: Payer: Self-pay | Admitting: Family Medicine

## 2016-03-07 DIAGNOSIS — E871 Hypo-osmolality and hyponatremia: Secondary | ICD-10-CM | POA: Diagnosis not present

## 2016-03-08 ENCOUNTER — Other Ambulatory Visit: Payer: Self-pay | Admitting: Family Medicine

## 2016-03-08 DIAGNOSIS — E875 Hyperkalemia: Secondary | ICD-10-CM

## 2016-03-08 LAB — BASIC METABOLIC PANEL
BUN/Creatinine Ratio: 19 (ref 12–28)
BUN: 18 mg/dL (ref 8–27)
CO2: 24 mmol/L (ref 18–29)
Calcium: 9.1 mg/dL (ref 8.7–10.3)
Chloride: 95 mmol/L — ABNORMAL LOW (ref 96–106)
Creatinine, Ser: 0.96 mg/dL (ref 0.57–1.00)
GFR calc Af Amer: 70 mL/min/{1.73_m2} (ref >59)
GFR calc non Af Amer: 61 mL/min/{1.73_m2} (ref >59)
Glucose: 184 mg/dL — ABNORMAL HIGH (ref 65–99)
Potassium: 5.5 mmol/L — ABNORMAL HIGH (ref 3.5–5.2)
Sodium: 133 mmol/L — ABNORMAL LOW (ref 134–144)

## 2016-03-08 MED ORDER — LISINOPRIL 10 MG PO TABS: 5.0000 mg | ORAL_TABLET | Freq: Every day | ORAL | 0 refills | Status: DC

## 2016-03-08 NOTE — Progress Notes (Signed)
Decrease ACE-I; recheck BMP in 10-14 days

## 2016-03-08 NOTE — Assessment & Plan Note (Signed)
Check labs in 10-14 days, decrease ACE-I

## 2016-03-15 DIAGNOSIS — M2140 Flat foot [pes planus] (acquired), unspecified foot: Secondary | ICD-10-CM | POA: Diagnosis not present

## 2016-03-25 ENCOUNTER — Other Ambulatory Visit: Payer: Self-pay

## 2016-03-25 DIAGNOSIS — E875 Hyperkalemia: Secondary | ICD-10-CM

## 2016-03-25 LAB — BASIC METABOLIC PANEL WITH GFR
BUN: 17 mg/dL (ref 7–25)
CO2: 26 mmol/L (ref 20–31)
Calcium: 8.9 mg/dL (ref 8.6–10.4)
Chloride: 100 mmol/L (ref 98–110)
Creat: 0.96 mg/dL (ref 0.50–0.99)
GFR, Est African American: 70 mL/min (ref >=60)
GFR, Est Non African American: 61 mL/min (ref >=60)
Glucose, Bld: 133 mg/dL — ABNORMAL HIGH (ref 65–99)
Potassium: 4.7 mmol/L (ref 3.5–5.3)
Sodium: 133 mmol/L — ABNORMAL LOW (ref 135–146)

## 2016-03-29 ENCOUNTER — Telehealth: Payer: Self-pay

## 2016-03-29 ENCOUNTER — Other Ambulatory Visit: Payer: Self-pay

## 2016-03-29 DIAGNOSIS — G8929 Other chronic pain: Secondary | ICD-10-CM

## 2016-03-29 DIAGNOSIS — M5416 Radiculopathy, lumbar region: Principal | ICD-10-CM

## 2016-03-29 NOTE — Telephone Encounter (Signed)
She needs to use the prescription written on Aug 30th please I reviewed NCCSRS web site; that prescription is still outstanding

## 2016-03-29 NOTE — Telephone Encounter (Signed)
Pt wanted a refill on her Noroco. Route refill information to Dr. Lada. Pt wanted to knowSherie Don where to pick the refill up. Call pt and left a message stating to her that once Dr. Sherie DonLada looks at the refill request she will know where to go.  Kipp LaurenceAmber Kerissa Coia, CMA

## 2016-04-03 ENCOUNTER — Other Ambulatory Visit: Payer: Self-pay

## 2016-04-03 ENCOUNTER — Telehealth: Payer: Self-pay

## 2016-04-03 ENCOUNTER — Telehealth: Payer: Self-pay | Admitting: Family Medicine

## 2016-04-03 ENCOUNTER — Other Ambulatory Visit: Payer: Self-pay | Admitting: Family Medicine

## 2016-04-03 DIAGNOSIS — M25561 Pain in right knee: Principal | ICD-10-CM

## 2016-04-03 DIAGNOSIS — G8929 Other chronic pain: Secondary | ICD-10-CM

## 2016-04-03 DIAGNOSIS — M25569 Pain in unspecified knee: Secondary | ICD-10-CM | POA: Insufficient documentation

## 2016-04-03 DIAGNOSIS — M5416 Radiculopathy, lumbar region: Principal | ICD-10-CM

## 2016-04-03 MED ORDER — LISINOPRIL 5 MG PO TABS: 5.0000 mg | ORAL_TABLET | Freq: Every day | ORAL | 1 refills | Status: DC

## 2016-04-03 NOTE — Telephone Encounter (Signed)
Please ask her to use the Rx that was written 02/28/16  NCCSRS web site shows that the last Rx she filled was written on 12/22/15 and picked up at the pharmacy on 01/10/16 so she should use the Rx that was written on 02/28/16; if it was indeed filled, I need verification from the pharmacy to that effect; thank you  I sent in new Rx for lisinopril

## 2016-04-03 NOTE — Telephone Encounter (Signed)
Pt wanted a refill on her Noroco. She only has three left; but she had a prescription printed and signed on 02/28/2016. She can still have it filled but PT can not find it and she states she will look for it. Pt want to know if she can have Cyclobenzaprine for a muscle relaxer. Her request for an Ortho doctor has been put in as well. Will consult with Dr. Sherie DonLada about Cyclobenzaprine.    Kipp LaurenceAmber Enyla Lisbon, CMA.

## 2016-04-03 NOTE — Telephone Encounter (Signed)
Patient states its hard for her to cut the lisinopril in half to make 5 mg can you write a new rx for 5mg ? Also patient wants to know if she can get referral for ortho for right leg and knee pain.

## 2016-04-03 NOTE — Telephone Encounter (Signed)
-----   Message from [facility] Nichole Parrish, [place] sent at 04/03/2016  2:42 PM EDT ----- Regarding: Muscle Relaxer Dr.Ioana Louks, Andrea Holden is requesting Flexeril or something generic to that. She states nothing is working to help relieve the pain.

## 2016-04-03 NOTE — Telephone Encounter (Signed)
I left brief message without identifying anything; just explained that I would not be calling in that medicine, not recommended in patients 65+ yo; try other means of dealing with symptoms already discussed by staff

## 2016-04-03 NOTE — Telephone Encounter (Signed)
Patient notified by amber 

## 2016-04-03 NOTE — Progress Notes (Signed)
Refer to knee pain at patient's request

## 2016-04-03 NOTE — Telephone Encounter (Signed)
Pt.notified

## 2016-04-12 ENCOUNTER — Ambulatory Visit: Payer: Medicare Other | Admitting: Family Medicine

## 2016-05-28 ENCOUNTER — Other Ambulatory Visit: Payer: Self-pay | Admitting: Family Medicine

## 2016-05-28 NOTE — Telephone Encounter (Signed)
Pt states she needs refill on Chantix. Pt states she will be out before her next appt.

## 2016-05-29 MED ORDER — VARENICLINE TARTRATE 1 MG PO TABS: 1.0000 mg | ORAL_TABLET | Freq: Two times a day (BID) | ORAL | 0 refills | Status: DC

## 2016-05-29 NOTE — Telephone Encounter (Signed)
rx sent locally; hope to stop after this Rx

## 2016-06-05 ENCOUNTER — Other Ambulatory Visit: Payer: Self-pay

## 2016-06-06 MED ORDER — SERTRALINE HCL 100 MG PO TABS: 100.0000 mg | ORAL_TABLET | Freq: Every day | ORAL | 1 refills | Status: DC

## 2016-06-06 MED ORDER — LISINOPRIL 5 MG PO TABS: 5.0000 mg | ORAL_TABLET | Freq: Every day | ORAL | 1 refills | Status: DC

## 2016-06-06 MED ORDER — GABAPENTIN 300 MG PO CAPS: 300.0000 mg | ORAL_CAPSULE | Freq: Three times a day (TID) | ORAL | 1 refills | Status: DC

## 2016-06-06 MED ORDER — METOPROLOL SUCCINATE ER 25 MG PO TB24: 25.0000 mg | ORAL_TABLET | Freq: Every day | ORAL | 1 refills | Status: DC

## 2016-06-07 ENCOUNTER — Telehealth: Payer: Self-pay | Admitting: Family Medicine

## 2016-06-07 DIAGNOSIS — Z1239 Encounter for other screening for malignant neoplasm of breast: Secondary | ICD-10-CM

## 2016-06-07 NOTE — Telephone Encounter (Signed)
Pt needed her Chantix reordered through the Pfizer Patient assistance program using patient ID [id number].  Patient can't afford to get the rx from the mail order pharmacy.  Asher MuirJamie called the Health Provider Reorder line @ [phone] on 06/07/16. Patient also wanted a refill on her pain medication but I informed her that Dr. Sherie DonLada was out of the office.  Patient is scheduled to see Dr. Sherie DonLada on 06/12/16 @ 12 noon. Patient also needs the Pfizer patient assistance program application completed again.

## 2016-06-11 ENCOUNTER — Ambulatory Visit: Payer: Medicare Other | Admitting: Family Medicine

## 2016-06-12 ENCOUNTER — Ambulatory Visit: Payer: Medicare Other | Admitting: Family Medicine

## 2016-06-12 ENCOUNTER — Other Ambulatory Visit: Payer: Self-pay

## 2016-06-12 DIAGNOSIS — M19071 Primary osteoarthritis, right ankle and foot: Secondary | ICD-10-CM | POA: Diagnosis not present

## 2016-06-12 DIAGNOSIS — G8929 Other chronic pain: Secondary | ICD-10-CM

## 2016-06-12 DIAGNOSIS — M1711 Unilateral primary osteoarthritis, right knee: Secondary | ICD-10-CM | POA: Diagnosis not present

## 2016-06-12 DIAGNOSIS — M5416 Radiculopathy, lumbar region: Principal | ICD-10-CM

## 2016-06-12 NOTE — Telephone Encounter (Signed)
Patient had appointment today and need refill on Vicodin. This is why she is coming in for today and has been reschedule for after christmas which is your first available and need for the holidays.

## 2016-06-12 NOTE — Telephone Encounter (Signed)
I checked the [employer] site and it appears that she still has the outstanding prescription that was written on August 30th; that is not showing up as filled in the data base The last Rx filled in her name was written on December 22, 2015 and filled on January 10, 2016 at [facility] Please ask her to use that Rx (the one given to her August 30th) If she says she filled it somewhere, please contact the pharmacy she filled it at and get a fax copy of that fill history, explaining to them that it's not showing up in the [place] data base and I need to confirm that the 02/28/16 Rx has been filled and picked up with date on the fax I'll be back Friday if she truly needs a new Rx; this requires hand-written Rx, so she can pick up on Friday if above criteria met; I hope that will be okay for her Thank you

## 2016-06-13 ENCOUNTER — Encounter: Payer: Self-pay | Admitting: Family Medicine

## 2016-06-13 NOTE — Telephone Encounter (Signed)
Patient states never received rx from sept or she lost it that's why it has never been refilled.  But states it is time for new rx, please advise.  Patient has an appt Monday.

## 2016-06-13 NOTE — Telephone Encounter (Signed)
Since you talked to her, do you mine calling her and letting her know what Dr. lada stated.

## 2016-06-13 NOTE — Telephone Encounter (Signed)
I'll have to discuss this with her on Monday, then. In the meantime, encourage her to check everywhere she thinks the Rx might be. We need that Rx found please. We'll readdress her pain management on Monday if she's not used any hydrocodone since August anyway. We'll talk on Monday. Thank you

## 2016-06-14 NOTE — Telephone Encounter (Signed)
-----   Message from Kerman PasseyMelinda P Lada, MD sent at 02/28/2016 12:00 PM EDT ----- Regarding: orders for mammo Due for 4 month f/u B mammo around 06/15/16

## 2016-06-14 NOTE — Telephone Encounter (Signed)
Please verify with the breast center if patient needs f/u imaging before one year, and will that date be January 28, 2017 or does she need earlier imaging? Please order imaging if recommended Whatever the answer, please correct the health maintenance date too; thank you  Copied and pasted from August 2017 biopsy report: The patient was instructed to return for annual screening [facility] in [place], [place]. -------------------------------------------

## 2016-06-14 NOTE — Telephone Encounter (Signed)
Routed back to me (?) I will talk to her Monday then

## 2016-06-17 ENCOUNTER — Encounter: Payer: Self-pay | Admitting: Family Medicine

## 2016-06-17 ENCOUNTER — Ambulatory Visit (INDEPENDENT_AMBULATORY_CARE_PROVIDER_SITE_OTHER): Payer: Medicare Other | Admitting: Family Medicine

## 2016-06-17 DIAGNOSIS — G8929 Other chronic pain: Secondary | ICD-10-CM

## 2016-06-17 DIAGNOSIS — F17210 Nicotine dependence, cigarettes, uncomplicated: Secondary | ICD-10-CM

## 2016-06-17 DIAGNOSIS — M5416 Radiculopathy, lumbar region: Secondary | ICD-10-CM

## 2016-06-17 DIAGNOSIS — I10 Essential (primary) hypertension: Secondary | ICD-10-CM

## 2016-06-17 MED ORDER — VARENICLINE TARTRATE 1 MG PO TABS: 1.0000 mg | ORAL_TABLET | Freq: Two times a day (BID) | ORAL | 0 refills | Status: DC

## 2016-06-17 MED ORDER — HYDROCODONE-ACETAMINOPHEN 10-325 MG PO TABS: 0.5000 | ORAL_TABLET | Freq: Two times a day (BID) | ORAL | 0 refills | Status: DC | PRN

## 2016-06-17 NOTE — Telephone Encounter (Signed)
Yes patient will need  earlier imaging. From what she saw her last imaging was December 2016 therefore she is due for one now.

## 2016-06-17 NOTE — Patient Instructions (Addendum)
I do encourage you to quit smoking Call [phone] to sign up for smoking cessation classes You can call 1-800-QUIT-NOW to talk with a smoking cessation coach Decrease the PPI to every third day and then after 1-2 weeks, just stop completely Caution: prolonged use of proton pump inhibitors like omeprazole (Prilosec), pantoprazole (Protonix), esomeprazole (Nexium), and others like Dexilant and Aciphex may increase your risk of pneumonia, Clostridium difficile colitis, osteoporosis, anemia and other health complications Try to limit or avoid triggers like coffee, caffeinated beverages, onions, chocolate, spicy foods, peppermint, acid foods like pizza, spaghetti sauce, and orange juice Lose weight if you are overweight or obese Try elevating the head of your bed by placing a small wedge between your mattress and box springs to keep acid in the stomach at night instead of coming up into your esophagus   Steps to Quit Smoking Smoking tobacco can be bad for your health. It can also affect almost every organ in your body. Smoking puts you and people around you at risk for many serious long-lasting (chronic) diseases. Quitting smoking is hard, but it is one of the best things that you can do for your health. It is never too late to quit. What are the benefits of quitting smoking? When you quit smoking, you lower your risk for getting serious diseases and conditions. They can include:  Lung cancer or lung disease.  Heart disease.  Stroke.  Heart attack.  Not being able to have children (infertility).  Weak bones (osteoporosis) and broken bones (fractures). If you have coughing, wheezing, and shortness of breath, those symptoms may get better when you quit. You may also get sick less often. If you are pregnant, quitting smoking can help to lower your chances of having a baby of low birth weight. What can I do to help me quit smoking? Talk with your doctor about what can help you quit smoking.  Some things you can do (strategies) include:  Quitting smoking totally, instead of slowly cutting back how much you smoke over a period of time.  Going to in-person counseling. You are more likely to quit if you go to many counseling sessions.  Using resources and support systems, such as:  Online chats with a Veterinary surgeoncounselor.  Phone quitlines.  Printed Materials engineerself-help materials.  Support groups or group counseling.  Text messaging programs.  Mobile phone apps or applications.  Taking medicines. Some of these medicines may have nicotine in them. If you are pregnant or breastfeeding, do not take any medicines to quit smoking unless your doctor says it is okay. Talk with your doctor about counseling or other things that can help you. Talk with your doctor about using more than one strategy at the same time, such as taking medicines while you are also going to in-person counseling. This can help make quitting easier. What things can I do to make it easier to quit? Quitting smoking might feel very hard at first, but there is a lot that you can do to make it easier. Take these steps:  Talk to your family and friends. Ask them to support and encourage you.  Call phone quitlines, reach out to support groups, or work with a Veterinary surgeoncounselor.  Ask people who smoke to not smoke around you.  Avoid places that make you want (trigger) to smoke, such as:  Bars.  Parties.  Smoke-break areas at work.  Spend time with people who do not smoke.  Lower the stress in your life. Stress can make you want to  smoke. Try these things to help your stress:  Getting regular exercise.  Deep-breathing exercises.  Yoga.  Meditating.  Doing a body scan. To do this, close your eyes, focus on one area of your body at a time from head to toe, and notice which parts of your body are tense. Try to relax the muscles in those areas.  Download or buy apps on your mobile phone or tablet that can help you stick to your quit  plan. There are many free apps, such as QuitGuide from the [employer] developer(Centers for Disease Control and Prevention). You can find more support from smokefree.gov and other websites. This information is not intended to replace advice given to you by your health care provider. Make sure you discuss any questions you have with your health care provider. Document Released: 04/13/2009 Document Revised: 02/13/2016 Document Reviewed: 11/01/2014 Elsevier Interactive Patient Education  2017 [employer]

## 2016-06-17 NOTE — Progress Notes (Signed)
BP 126/78   Pulse 73   Temp 98.3 F (36.8 C) (Oral)   Resp 16   Wt 212 lb (96.2 kg)   SpO2 95%   BMI 38.78 kg/m    Subjective:    Patient ID: Andrea Holden, female    DOB: 04/25/1946, 70 y.o.   MRN: [id number]  HPI: Andrea Holden is a 70 y.o. female  Chief Complaint  Patient presents with  . Follow-up  . paperwork   Patient says that she needs to have the Chantix sent back to the pharmacy Miel and Asher MuirJamie checked on this She is on the chantix 1 mg BID; has cut down from 1 ppd, to 1/2 ppd to 5-7 cigarettes a day Very happy with progress Has been on the Chantix for 3 months already Eating is a trigger She asked about the  She has seen Dr. Martha ClanKrasinski; just saw him last week; [facility]; he doesn't agree that there is nothing that can be done; he wants her to see foot and ankle doctor; he prescriber her therapy (PT) Reviewed AVS She has chronic pain and uses hydrocodone PRN; we reviewed the last office visit and it does not show that the Rx was actually printed, and there is no fill hx, and patient does not think she was given the script; these are plausible and do not sound like diversion Has a scabby lesion on left abdomen; years and years  Dep[facility] screen [facility] 06/17/2016 02/28/2016 12/22/2015 08/15/2015 03/16/2015  Decreased Interest 0 0 0 0 0  Down, Depressed, Hopeless 1 1 1  0 0  PHQ - 2 Score 1 1 1  0 0   Relevant past medical, surgical, family and social history reviewed Past Medical History:  Diagnosis Date  . Allergy    seasonal allergies  . Anemia   . Anxiety   . Arthritis   . Asthma    allergy induced asthma  . Cataract   . Clotting disorder (HCC)    history of blood clots  . Complication of anesthesia    arrhythmia following colonoscopy  . Degenerative disc disease, lumbar   . Degenerative disc disease, lumbar   . Depression   . Diabetes mellitus   . Diabetic neuropathy (HCC) 11/16/2015  . Dysrhythmia   . GERD  (gastroesophageal reflux disease)   . H/O eating disorder   . H/O transfusion    patient was given 5 units [facility] blood [facility], blood type O+  . History of chicken pox   . History of hiatal hernia   . History of measles, mumps, or rubella   . HOH (hard of hearing)   . Hyperlipidemia   . Hypertension   . Irregular heartbeat   . Moderate COPD (chronic obstructive pulmonary disease) (HCC) 12/14/2014  . Neuropathy (HCC)   . Opiate use 11/16/2015  . Orthopnea   . Pes planus of both feet 11/16/2015  . Plantar fasciitis of right foot 11/16/2015  . Wheezing    Past Surgical History:  Procedure Laterality Date  . ABDOMINAL HYSTERECTOMY    . CATARACT EXTRACTION W/PHACO Right 05/30/2015   Procedure: CATARACT EXTRACTION PHACO AND INTRAOCULAR LENS PLACEMENT (IOC);  Surgeon: Galen ManilaWilliam Porfilio, MD;  Location: [facility] ORS;  Service: Ophthalmology;  Laterality: Right;  US 00:57 AP% 20.9 CDE 11.99 fluid pack lot #[id number]  . CHOLECYSTECTOMY    . COLON SURGERY     blocked colon  . GALLBLADDER SURGERY    . GASTRIC  BYPASS  2010  . internal bleeding  2016  . MOUTH SURGERY    . OVARY SURGERY    . TONSILLECTOMY     Family History  Problem Relation Age of Onset  . Asthma Mother   . COPD Mother   . Arthritis Father   . Depression Father   . Heart disease Father   . Hypertension Father   . Stroke Father   . Arthritis Brother   . Depression Brother   . Diabetes Brother   . Heart disease Brother   . Hyperlipidemia Brother   . Hypertension Brother   . Stroke Brother   . Vision loss Brother   . Diabetes Maternal Grandmother   . Breast cancer Neg Hx    Social History  Substance Use Topics  . Smoking status: Current Some Day Smoker    Types: Cigarettes    Last attempt to quit: 10/03/2007  . Smokeless tobacco: Never Used  . Alcohol use 0.0 oz/week     Comment: OCCASIONALLY    Interim medical history since last visit reviewed. Allergies and medications reviewed  Review of Systems Per  HPI unless specifically indicated above     Objective:    BP 126/78   Pulse 73   Temp 98.3 F (36.8 C) (Oral)   Resp 16   Wt 212 lb (96.2 kg)   SpO2 95%   BMI 38.78 kg/m   Wt Readings from Last 3 Encounters:  06/17/16 212 lb (96.2 kg)  02/28/16 210 lb (95.3 kg)  12/22/15 207 lb (93.9 kg)    Physical Exam  Constitutional: She appears well-developed and well-nourished.  HENT:  Mouth/Throat: Mucous membranes are normal.  Eyes: EOM are normal. No scleral icterus.  Cardiovascular: Normal rate and regular rhythm.   Pulmonary/Chest: Effort normal and breath sounds normal.  Neurological: She is alert.  Psychiatric: She has a normal mood and affect. Her behavior is normal.    Results for orders placed or performed in visit on 03/25/16  BASIC METABOLIC PANEL WITH GFR  Result Value Ref Range   Sodium 133 (L) 135 - 146 mmol/L   Potassium 4.7 3.5 - 5.3 mmol/L   Chloride 100 98 - 110 mmol/L   CO2 26 20 - 31 mmol/L   Glucose, Bld 133 (H) 65 - 99 mg/dL   BUN 17 7 - 25 mg/dL   Creat 2.95 6.21 - 3.08 mg/dL   Calcium 8.9 8.6 - 65.7 mg/dL   GFR, Est African American 70 >=60 mL/min   GFR, Est Non African American 61 >=60 mL/min      Assessment & Plan:   Problem List Items Addressed This Visit      Cardiovascular and Mediastinum   Hypertension goal BP (blood pressure) < 140/90    Well-controlled        Other   Cigarette smoker (Chronic)    Encouraged her to quit smoking; see AVS; will plan 6 months Chantix total; discussed risks of Chantix, coronary artery disease, stroke      Chronic radicular lumbar pain   Relevant Medications   HYDROcodone-acetaminophen (NORCO) 10-325 MG tablet      Follow up plan: Return in about 2 months (around 08/21/2016) for fasting labs, prediabetes, visit with Dr. Sherie Don.  An after-visit summary was printed and given to the patient at check-out.  Please see the patient instructions which may contain other information and recommendations beyond  what is mentioned above in the assessment and plan.  Meds ordered this encounter  Medications  .  HYDROcodone-acetaminophen (NORCO) 10-325 MG tablet    Sig: Take 0.5 tablets by mouth 2 (two) times daily as needed for severe pain.    Dispense:  30 tablet    Refill:  0    Controlled substance contract  . varenicline (CHANTIX CONTINUING MONTH PAK) 1 MG tablet    Sig: Take 1 tablet (1 mg total) by mouth 2 (two) times daily.    Dispense:  180 tablet    Refill:  0    No orders of the defined types were placed in this encounter.   chantix going to patient assistant program, [facility]

## 2016-06-17 NOTE — Assessment & Plan Note (Signed)
Encouraged her to quit smoking; see AVS; will plan 6 months Chantix total; discussed risks of Chantix, coronary artery disease, stroke

## 2016-06-17 NOTE — Assessment & Plan Note (Signed)
Well controlled 

## 2016-06-17 NOTE — Telephone Encounter (Signed)
No, she had imaging done in July and again with biopsy in August 2017 Please call the breast center, not the patient, and find out when her next breast imaging is due, what is needed, and order as indicated Thank you

## 2016-06-18 DIAGNOSIS — Z1239 Encounter for other screening for malignant neoplasm of breast: Secondary | ICD-10-CM | POA: Insufficient documentation

## 2016-06-18 NOTE — Telephone Encounter (Signed)
Thank you I went ahead and put in the orders Please contact patient and ask her to call and schedule Thank you

## 2016-06-18 NOTE — Telephone Encounter (Signed)
I was able to talk to someone who was little more clear about her dates. Patient last bilateral Image was June 16 2015. Patient had an imaging in July as well and biopsy in August 2017. Once everything check out and it was normal they mention to the patient that she can now go back to her annual bilateral imaging, which is due now. I hope this help to understand what she needs next.

## 2016-06-18 NOTE — Telephone Encounter (Signed)
Yeah let me call Andrea Holden again. I talked to  a young lady that went over her visit. I looked at the health maintence and saw she had one in august. I will have to be a little more clear on what I need. Thanks

## 2016-06-19 NOTE — Telephone Encounter (Signed)
Called patient and left her a voicemail regarding her scheduling her breast image appointment.

## 2016-06-25 ENCOUNTER — Encounter: Payer: Self-pay | Admitting: Family Medicine

## 2016-06-26 ENCOUNTER — Ambulatory Visit: Payer: Medicare Other | Admitting: Family Medicine

## 2016-06-27 ENCOUNTER — Encounter: Payer: Self-pay | Admitting: Physical Therapy

## 2016-06-27 ENCOUNTER — Ambulatory Visit: Payer: Medicare Other | Attending: Orthopedic Surgery | Admitting: Physical Therapy

## 2016-06-27 DIAGNOSIS — R262 Difficulty in walking, not elsewhere classified: Secondary | ICD-10-CM | POA: Insufficient documentation

## 2016-06-27 DIAGNOSIS — M6281 Muscle weakness (generalized): Secondary | ICD-10-CM | POA: Diagnosis not present

## 2016-06-27 DIAGNOSIS — M25562 Pain in left knee: Secondary | ICD-10-CM | POA: Insufficient documentation

## 2016-06-28 NOTE — Therapy (Signed)
[facility] PHYSICAL AND SPORTS MEDICINE [address] [place], [place], [address] Phone: [phone]   Fax:  [phone]  Physical Therapy Evaluation  Patient Details  Name: Andrea Holden MRN: [id number] Date of Birth: [birth year] Referring Provider: Juanell FairlyKrasinski, Kevin MD  Encounter Date: 06/27/2016      PT End of Session - 06/27/16 1200    Visit Number 1   Number of Visits 16   Date for PT Re-Evaluation 08/22/16   Authorization Type 1   Authorization Time Period 10 (G code)   PT Start Time 1105   PT Stop Time 1205   PT Time Calculation (min) 60 min   Activity Tolerance Patient tolerated treatment well   Behavior During Therapy [facility] for tasks assessed/performed      Past Medical History:  Diagnosis Date  . Allergy    seasonal allergies  . Anemia   . Anxiety   . Arthritis   . Asthma    allergy induced asthma  . Cataract   . Clotting disorder (HCC)    history of blood clots  . Complication of anesthesia    arrhythmia following colonoscopy  . Degenerative disc disease, lumbar   . Degenerative disc disease, lumbar   . Depression   . Diabetes mellitus   . Diabetic neuropathy (HCC) 11/16/2015  . Dysrhythmia   . GERD (gastroesophageal reflux disease)   . H/O eating disorder   . H/O transfusion    patient was [facility], blood type O+  . History of chicken pox   . History of hiatal hernia   . History of measles, mumps, or rubella   . HOH (hard of hearing)   . Hyperlipidemia   . Hypertension   . Irregular heartbeat   . Moderate COPD (chronic obstructive pulmonary disease) (HCC) 12/14/2014  . Neuropathy (HCC)   . Opiate use 11/16/2015  . Orthopnea   . Pes planus of both feet 11/16/2015  . Plantar fasciitis of right foot 11/16/2015  . Wheezing     Past Surgical History:  Procedure Laterality Date  . ABDOMINAL HYSTERECTOMY    . CATARACT EXTRACTION W/PHACO Right 05/30/2015   Procedure: CATARACT  EXTRACTION PHACO AND INTRAOCULAR LENS PLACEMENT (IOC);  Surgeon: Galen ManilaWilliam Porfilio, MD;  Location: [facility] ORS;  Service: Ophthalmology;  Laterality: Right;  US 00:57 AP% 20.9 CDE 11.99 fluid pack lot #[id number]  . CHOLECYSTECTOMY    . COLON SURGERY     blocked colon  . GALLBLADDER SURGERY    . GASTRIC BYPASS  2010  . internal bleeding  2016  . MOUTH SURGERY    . OVARY SURGERY    . TONSILLECTOMY      There were no vitals filed for this visit.       Subjective Assessment - 06/27/16 1144    Subjective Patient reports she is having pain behind right knee that has gotten progressively worse over the past 6 months because of the way she is walking because of flat right foot with collapsing arch. she is going for evaluation of right foot to see what can be done to assist with correcting gait pattern to help with pain.    Pertinent History chronic back pain and foot pain over many years since 1995 with previous physical therapy with good results.    Limitations Sitting;Standing;Walking;House hold activities   Patient Stated Goals Patient wants to walk more normally with less pain   Currently in Pain? Yes  Pain Score 5    Pain Location Knee   Pain Orientation Right   Pain Descriptors / Indicators Aching   Pain Type Chronic pain   Pain Onset More than a month ago   Pain Frequency Constant            [facility] PT Assessment - 06/27/16 1123      Assessment   Medical Diagnosis Unilateral primary osteoarthritis, right knee   Referring Provider Juanell FairlyKrasinski, Kevin MD   Onset Date/Surgical Date 11/30/15   Hand Dominance Right   Prior Therapy none     Precautions   Precautions None     Restrictions   Weight Bearing Restrictions No     Balance Screen   Has the patient fallen in the past 6 months No   Has the patient had a decrease in activity level because of a fear of falling?  No   Is the patient reluctant to leave their home because of a fear of falling?  No     Home Environment    Living Environment Private residence   Living Arrangements Alone   Type of Home House   Home Access Stairs to enter   Entrance Stairs-Number of Steps 3   Entrance Stairs-Rails Right;Left;Can reach both   Home Layout One level     Prior Function   Level of Independence Independent   Vocation Part time employment   Vocation Requirements substitute teaching     Cognition   Overall Cognitive Status Within Functional Limits for tasks assessed      Objective: Posture: bilateral pronated feet, valgus right knee>left with ER at hip AROM: left hip flexion, abduction rotation WFL Strength: (grossly tested due to patient being inconsistent with ability to hold contraction steady) RLE: decreased right knee flexion, knee extension, hip abduction and ER grossly 4-5, knee flexion not formally assessed due to c/o pain LLE: hip flexion 4-/5, knee extension 4/5, hip abduction 4-/5 Gait; ambulating with antalgic gait pattern Palpation; generalized tenderness along right thigh, knee, posterior knee Sensation; impaired to light touch throughout right LE and left lower leg/foot        PT Education - 06/27/16 1149    Education provided Yes   Education Details HEP: sittiing hip adduction with ball and glute sets x 10, hip abduction with resistive band x 15, roll ball under foot x 1 min.   Person(s) Educated Patient   Methods Explanation;Demonstration;Verbal cues;Handout   Comprehension Verbalized understanding;Returned demonstration;Verbal cues required             PT Long Term Goals - 06/27/16 1212      PT LONG TERM GOAL #1   Title Patient will improve LEFS score to 40/80 by 08/22/2016 demonstrating improved function with daily tasks and able to stand, sit and walk with less difficulty   Baseline LEFS 17/80   Status New     PT LONG TERM GOAL #2   Title patient will be indepenent with home program for pain control and progressive exercises for flexibiltiy and strength by discharge to  continue with self managment by 08/22/2016   Baseline limited knowledge of appropriate pain control strategies and progression of exercises   Status New               Plan - 06/27/16 1201    Clinical Impression Statement Patient is a 70 year old female who presents with right knee and foot pain with limitations of pain, weakness and limited ability to perform daily tasks, walking without  difficulty. She reports moderate difficulty with sleeping and has LEFS score of 17/80 and MODI score of 58% indicating severe self perceived disability with function. She has multiple co morbities including neuropathy, bilateral foot pain, plantar fasciitis, heel spurs, back pain. She has limited knowledge of appropriate pain control strategies, progression of exercises to improve function.    Rehab Potential Fair   Clinical Impairments Affecting Rehab Potential (+) motivated (-) muliple co morbidities, chronic condition   PT Frequency 2x / week   PT Duration 8 weeks   PT Treatment/Interventions Electrical Stimulation;Moist Heat;Ultrasound;Patient/family education;Neuromuscular re-education;Therapeutic exercise;Manual techniques   PT Next Visit Plan pain control, ther. ex   PT Home Exercise Plan stabilizaiton exercises, strengthening as instructed   Consulted and Agree with Plan of Care Patient      Patient will benefit from skilled therapeutic intervention in order to improve the following deficits and impairments:  Decreased strength, Impaired flexibility, Decreased activity tolerance, Impaired perceived functional ability, Pain, Decreased endurance, Difficulty walking  Visit Diagnosis: Left knee pain, unspecified chronicity - Plan: PT plan of care cert/re-cert  Muscle weakness (generalized) - Plan: PT plan of care cert/re-cert  Difficulty in walking, not elsewhere classified - Plan: PT plan of care cert/re-cert      G-Codes - 07-08-2016 Nov 18, 1215    Functional Assessment Tool Used LEFS, strength,  ROM, pain, clinical judgment   Functional Limitation Mobility: Walking and moving around   Mobility: Walking and Moving Around Current Status ([id number]) At least 40 percent but less than 60 percent impaired, limited or restricted   Mobility: Walking and Moving Around Goal Status [phone]) At least 20 percent but less than 40 percent impaired, limited or restricted       Problem List Patient Active Problem List   Diagnosis Date Noted  . Breast cancer screening 06/18/2016  . Knee pain 04/03/2016  . Hyperkalemia 03/01/2016  . Hyponatremia 03/01/2016  . High triglycerides 12/24/2015  . Depression with anxiety 12/24/2015  . Pes planus of both feet 11/16/2015  . Plantar fasciitis of right foot 11/16/2015  . Ingrown toenail 11/16/2015  . Heel spur 11/16/2015  . Diabetic neuropathy (HCC) 11/16/2015  . Right ankle pain 11/16/2015  . Controlled substance agreement signed 11/16/2015  . Medication monitoring encounter 11/16/2015  . Opiate use 11/16/2015  . Diabetes mellitus without complication (HCC) 08/15/2015  . Need for Tdap vaccination 08/15/2015  . Chronic pain of multiple joints 08/15/2015  . Abnormal mammogram of right breast 06/19/2015  . Cerumen impaction 03/16/2015  . Hypertension goal BP (blood pressure) < 140/90 12/14/2014  . Hyperlipidemia LDL goal <100 12/14/2014  . History of transfusion of packed RBC 12/14/2014  . COPD, mild (HCC) 12/14/2014  . Cigarette smoker 12/14/2014  . Status post bariatric surgery 12/14/2014  . Obesity 12/14/2014  . Chronic radicular lumbar pain 12/14/2014  . History of GI bleed 11/12/2014  . GERD without esophagitis 11/12/2014  . History of small bowel obstruction 09/07/2014    Beacher May PT 06/28/2016, 12:23 PM  [facility] PHYSICAL AND SPORTS MEDICINE [address] [place], [address] Phone: [phone]   Fax:  [phone]  Name: Andrea Holden MRN: [id number] Date of Birth:  July 09, 1945

## 2016-07-01 ENCOUNTER — Other Ambulatory Visit: Payer: Self-pay | Admitting: Family Medicine

## 2016-07-01 HISTORY — PX: SKIN GRAFT: SHX250

## 2016-07-01 HISTORY — PX: FOOT FUSION: SHX956

## 2016-07-03 ENCOUNTER — Other Ambulatory Visit: Payer: Self-pay

## 2016-07-03 MED ORDER — RANITIDINE HCL 300 MG PO TABS: 300.0000 mg | ORAL_TABLET | Freq: Every day | ORAL | 1 refills | Status: DC

## 2016-07-03 NOTE — Telephone Encounter (Signed)
Done, thank you!

## 2016-07-03 NOTE — Telephone Encounter (Signed)
Patient notified but she now uses [facility] please send rx z[facility]ac to [facility] updated in chart

## 2016-07-03 NOTE — Telephone Encounter (Signed)
Glitch in the system; I was not getting electronic refill requests from Dec 13th until yesterday; addressed with IT; handling refill requests now --------------------- I just approved a 3 month supply of gabapentin [facility]06/[facility] Patient now requesting another 3 month supply of gabapentin to [facility] Even if she has changed pharmacies, it is too early for the gabapentin; that's not due again until early March Also, please let her know that PPIs have risks and it's not recommended that people 5265 and over take these for more than 8 weeks I'll suggest that she significant change her diet if she is consuming triggers drinks and foods, and try ranitidine instead of the PPI; I'll [facility] Rx [facility]

## 2016-07-04 ENCOUNTER — Ambulatory Visit: Payer: Medicare PPO | Attending: Orthopedic Surgery | Admitting: Physical Therapy

## 2016-07-04 DIAGNOSIS — M25561 Pain in right knee: Secondary | ICD-10-CM | POA: Insufficient documentation

## 2016-07-04 DIAGNOSIS — R262 Difficulty in walking, not elsewhere classified: Secondary | ICD-10-CM | POA: Insufficient documentation

## 2016-07-04 DIAGNOSIS — M25562 Pain in left knee: Secondary | ICD-10-CM | POA: Insufficient documentation

## 2016-07-04 DIAGNOSIS — M62838 Other muscle spasm: Secondary | ICD-10-CM | POA: Insufficient documentation

## 2016-07-04 DIAGNOSIS — M6281 Muscle weakness (generalized): Secondary | ICD-10-CM | POA: Insufficient documentation

## 2016-07-09 ENCOUNTER — Encounter: Payer: Self-pay | Admitting: Physical Therapy

## 2016-07-09 ENCOUNTER — Ambulatory Visit: Payer: Medicare PPO | Admitting: Physical Therapy

## 2016-07-09 DIAGNOSIS — M6281 Muscle weakness (generalized): Secondary | ICD-10-CM | POA: Diagnosis present

## 2016-07-09 DIAGNOSIS — M62838 Other muscle spasm: Secondary | ICD-10-CM | POA: Diagnosis present

## 2016-07-09 DIAGNOSIS — M25562 Pain in left knee: Secondary | ICD-10-CM

## 2016-07-09 DIAGNOSIS — M25561 Pain in right knee: Secondary | ICD-10-CM | POA: Diagnosis present

## 2016-07-09 DIAGNOSIS — R262 Difficulty in walking, not elsewhere classified: Secondary | ICD-10-CM

## 2016-07-09 NOTE — Therapy (Signed)
[place] [facility] PH[facility]TS MEDICINE [address] [place], [address] Phone: [phone]   Fax:  [phone]  Physical Therapy Treatment  Patient Details  Name: Andrea Holden MRN: [id number] Date of Birth: [birth year] Referring Provider: [name] MD  Encounter Date: 07/09/2016      PT End of Session - 07/09/16 0910    Visit Number 2   Number of Visits 16   Date for PT Re-Evaluation 08/22/16   Authorization Type 2   Authorization Time Period 10 (G code)   PT Start Time 0905   PT Stop Time 0945   PT Time Calculation (min) 40 min   Activity Tolerance Patient tolerated treatment well; limited by pain   Behavior During Therapy [facility] for tasks assessed/performed      Past Medical History:  Diagnosis Date  . Allergy    seasonal allergies  . Anemia   . Anxiety   . Arthritis   . Asthma    allergy induced asthma  . Cataract   . Clotting disorder (HCC)    history of blood clots  . Complication of anesthesia    arrhythmia following colonoscopy  . Degenerative disc disease, lumbar   . Degenerative disc disease, lumbar   . Depression   . Diabetes mellitus   . Diabetic neuropathy (HCC) 11/16/2015  . Dysrhythmia   . GERD (gastroesophageal reflux disease)   . H/O eating disorder   . H/O transfusion    patient was given 5 units of blood while at [facility], blood type O+  . History of chicken pox   . History of hiatal hernia   . History of measles, mumps, or rubella   . HOH (hard of hearing)   . Hyperlipidemia   . Hypertension   . Irregular heartbeat   . Moderate COPD (chronic obstructive pulmonary disease) (HCC) 12/14/2014  . Neuropathy (HCC)   . Opiate use 11/16/2015  . Orthopnea   . Pes planus of both feet 11/16/2015  . Plantar fasciitis of right foot 11/16/2015  . Wheezing     Past Surgical History:  Procedure Laterality Date  . ABDOMINAL HYSTERECTOMY    . CATARACT EXTRACTION W/PHACO Right 05/30/2015   Procedure: CATARACT EXTRACTION PHACO AND INTRAOCULAR LENS PLACEMENT (IOC);  Surgeon: Galen Manila, MD;  Location: [facility] ORS;  Service: Ophthalmology;  Laterality: Right;  [place] 00:57 AP% 20.9 CDE 11.99 fluid pack lot #[id number]  . CHOLECYSTECTOMY    . COLON SURGERY     blocked colon  . GALLBLADDER SURGERY    . GASTRIC BYPASS  2010  . internal bleeding  2016  . MOUTH SURGERY    . OVARY SURGERY    . TONSILLECTOMY      There were no vitals filed for this visit.      Subjective Assessment - 07/09/16 0908    Subjective Patient continues with pain behind right knee, lateral aspect. She reports her knee gives way when standing, moving for no apparent reason. She reports her knee had increased pain when stepping out of car to come into clinic.   Limitations Sitting;Standing;Walking;House hold activities   Patient Stated Goals Patient wants to walk more normally with less pain   Currently in Pain? Yes   Pain Score 8 with walking, none sitting   Pain Location Knee   Pain Orientation Right   Pain Descriptors / Indicators Aching   Pain Type Chronic pain   Pain Onset More than a month ago   Pain Frequency  Constant     Objective: Palpation: right knee posterior aspect with mild swelling that is non tender; + tenderness along distal hamstring muscle lateral>medial aspect Gait: ambulating with SPC into clinic, right foot in everted position, unable to correct   Treatment:  Therapeutic exercise: performed with VC, tactile cues and demonstration of therapist Sitting: Multi angle knee flexion isometrics 3 sets at 30,60, 90, 100 degrees flexion with 5 second holds Hip adduction with glute sets with ball, hold 3 seconds, 15 reps Hip abduction with manual resistance end range hold 5 seconds x 10 reps Hamstring curls with yellow theraband x 15 reps each LE through short arc right knee to avoid increased pain; 15 reps with left LE without difficulty or pain Sit to stand x 10 reps off elevated  treatment table with ball between knees and contact assist to perform with good technique without leaning forward Manual therapy: STM to right hamstring muscle x 5 min with patient seated with knee extended  NuStep x 5 min. Level #2 with standby supervision monitoring: mild pain with knee extension; SPO2 98%, HR 60bpm    Patient response to treatment: patient demonstrated improved technique with exercises with minimal VC for correct alignment. Mild pain reported throughout session, no worse. Patient with decreased tenderness in right posterior knee with STM. Patient with improved soft tissue elasticity by 50% following STM. Improved motor control with repetition and cuing         PT Education - 07/09/16 0930    Education provided Yes   Education Details HEP: continue with exercises for hip and knee as insructed, through short arc to avoid increased symptoms. Added hamstring curls through short arc with yellow theraband   Person(s) Educated Patient   Methods Explanation;Demonstration;Verbal cues   Comprehension Verbalized understanding;Returned demonstration;Verbal cues required             PT Long Term Goals - 06/27/16 1212      PT LONG TERM GOAL #1   Title Patient will improve LEFS score to 40/80 by 08/22/2016 demonstrating improved function with daily tasks and able to stand, sit and walk with less difficulty   Baseline LEFS 17/80   Status New     PT LONG TERM GOAL #2   Title patient will be indepenent with home program for pain control and progressive exercises for flexibiltiy and strength by discharge to continue with self managment by 08/22/2016   Baseline limited knowledge of appropriate pain control strategies and progression of exercises   Status New               Plan - 07/09/16 0940    Clinical Impression Statement Patient with decreased soft tissue tenderness and improved elasticity following STM. She is progressing slowly due to pain in right knee with all  exercises and movement. She is to have evaluation with Dr. [name] Dupre regarding right foot/ankle this week and this will assist with determining further course of treatment.    Rehab Potential Fair   PT Frequency 2x / week   PT Duration 8 weeks   PT Treatment/Interventions Electrical Stimulation;Moist Heat;Ultrasound;Patient/family education;Neuromuscular re-education;Therapeutic exercise;Manual techniques   PT Next Visit Plan pain control, ther. ex, manual therapy   PT Home Exercise Plan stabilizaiton exercises, strengthening as instructed      Patient will benefit from skilled therapeutic intervention in order to improve the following deficits and impairments:  Decreased strength, Impaired flexibility, Decreased activity tolerance, Impaired perceived functional ability, Pain, Decreased endurance, Difficulty walking  Visit Diagnosis: Left knee  pain, unspecified chronicity  Muscle weakness (generalized)  Difficulty in walking, not elsewhere classified     Problem List Patient Active Problem List   Diagnosis Date Noted  . Breast cancer screening 06/18/2016  . Knee pain 04/03/2016  . Hyperkalemia 03/01/2016  . Hyponatremia 03/01/2016  . High triglycerides 12/24/2015  . Depression with anxiety 12/24/2015  . Pes planus of both feet 11/16/2015  . Plantar fasciitis of right foot 11/16/2015  . Ingrown toenail 11/16/2015  . Heel spur 11/16/2015  . Diabetic neuropathy (HCC) 11/16/2015  . Right ankle pain 11/16/2015  . Controlled substance agreement signed 11/16/2015  . Medication monitoring encounter 11/16/2015  . Opiate use 11/16/2015  . Diabetes mellitus without complication (HCC) 08/15/2015  . Need for Tdap vaccination 08/15/2015  . Chronic pain of multiple joints 08/15/2015  . Abnormal mammogram of right breast 06/19/2015  . Cerumen impaction 03/16/2015  . Hypertension goal BP (blood pressure) < 140/90 12/14/2014  . Hyperlipidemia LDL goal <100 12/14/2014  . History of  transfusion of packed RBC 12/14/2014  . COPD, mild (HCC) 12/14/2014  . Cigarette smoker 12/14/2014  . Status post bariatric surgery 12/14/2014  . Obesity 12/14/2014  . Chronic radicular lumbar pain 12/14/2014  . History of GI bleed 11/12/2014  . GERD without esophagitis 11/12/2014  . History of small bowel obstruction 09/07/2014    Beacher MayBrooks, Marie PT 07/09/2016, 3:25 PM  [facility] [address] [place], [place], [address] Phone: [phone]   Fax:  [phone]  Name: Juliette MangleDianna J Kostick MRN: [id number] Date of Birth: 1946-05-20

## 2016-07-11 ENCOUNTER — Ambulatory Visit: Payer: Medicare PPO | Admitting: Physical Therapy

## 2016-07-11 DIAGNOSIS — M76821 Posterior tibial tendinitis, right leg: Secondary | ICD-10-CM | POA: Insufficient documentation

## 2016-07-11 DIAGNOSIS — M19071 Primary osteoarthritis, right ankle and foot: Secondary | ICD-10-CM | POA: Insufficient documentation

## 2016-07-16 ENCOUNTER — Ambulatory Visit
Admission: RE | Admit: 2016-07-16 | Discharge: 2016-07-16 | Disposition: A | Discharge status: Home / Self Care | Payer: Medicare PPO | Source: Ambulatory Visit | Attending: Family Medicine | Admitting: Family Medicine

## 2016-07-16 DIAGNOSIS — Z1239 Encounter for other screening for malignant neoplasm of breast: Secondary | ICD-10-CM

## 2016-07-17 ENCOUNTER — Ambulatory Visit: Payer: Medicare PPO | Admitting: Physical Therapy

## 2016-07-19 ENCOUNTER — Encounter: Payer: Self-pay | Admitting: Physical Therapy

## 2016-07-19 ENCOUNTER — Ambulatory Visit: Payer: Medicare PPO | Admitting: Physical Therapy

## 2016-07-19 DIAGNOSIS — R262 Difficulty in walking, not elsewhere classified: Secondary | ICD-10-CM

## 2016-07-19 DIAGNOSIS — M25561 Pain in right knee: Secondary | ICD-10-CM

## 2016-07-19 DIAGNOSIS — M6281 Muscle weakness (generalized): Secondary | ICD-10-CM

## 2016-07-19 NOTE — Therapy (Signed)
[facility] [address] [place], [place], [address] Phone: [phone]   Fax:  [phone]  Physical Therapy Treatment  Patient Details  Name: Andrea Holden MRN: [id number] Date of Birth: [birth year] Referring Provider: Juanell FairlyKrasinski, Kevin MD  Encounter Date: 07/19/2016      PT End of Session - 07/19/16 1224    Visit Number 3   Number of Visits 16   Date for PT Re-Evaluation 08/22/16   Authorization Type 3   Authorization Time Period 10 (G code)   PT Start Time 1102   PT Stop Time 1218   PT Time Calculation (min) 76 min   Activity Tolerance Patient tolerated treatment well   Behavior During Therapy [facility] for tasks assessed/performed      Past Medical History:  Diagnosis Date  . Allergy    seasonal allergies  . Anemia   . Anxiety   . Arthritis   . Asthma    allergy induced asthma  . Cataract   . Clotting disorder (HCC)    history of blood clots  . Complication of anesthesia    arrhythmia following colonoscopy  . Degenerative disc disease, lumbar   . Degenerative disc disease, lumbar   . Depression   . Diabetes mellitus   . Diabetic neuropathy (HCC) 11/16/2015  . Dysrhythmia   . GERD (gastroesophageal reflux disease)   . H/O eating disorder   . H/O transfusion    [facility], blood type O+  . History of chicken pox   . History of hiatal hernia   . History of measles, mumps, or rubella   . HOH (hard of hearing)   . Hyperlipidemia   . Hypertension   . Irregular heartbeat   . Moderate COPD (chronic obstructive pulmonary disease) (HCC) 12/14/2014  . Neuropathy (HCC)   . Opiate use 11/16/2015  . Orthopnea   . Pes planus of both feet 11/16/2015  . Plantar fasciitis of right foot 11/16/2015  . Wheezing     Past Surgical History:  Procedure Laterality Date  . ABDOMINAL HYSTERECTOMY    . CATARACT EXTRACTION W/PHACO Right 05/30/2015   Procedure: CATARACT  EXTRACTION PHACO AND INTRAOCULAR LENS PLACEMENT (IOC);  Surgeon: Galen ManilaWilliam Porfilio, MD;  Location: [facility] ORS;  Service: Ophthalmology;  Laterality: Right;  US 00:57 AP% 20.9 CDE 11.99 fluid pack lot #[id number] H  . CHOLECYSTECTOMY    . COLON SURGERY     blocked colon  . GALLBLADDER SURGERY    . GASTRIC BYPASS  2010  . internal bleeding  2016  . MOUTH SURGERY    . OVARY SURGERY    . TONSILLECTOMY      There were no vitals filed for this visit.      Subjective Assessment - 07/19/16 1104    Subjective Patient reports she is not having any pain in her right knee currently and decreased cramping in her knee and less right ankle pain and she is curently having back pain that radiates to right hip/LE. She [name] and is scheduled for surgery in June for ankle reconstruction. She reports she is exercising as instructed and is not sure of how to perform all the exercises correctly. She would also like to address core, UE strength to prepare for surgery.    Limitations Sitting;Standing;Walking;House hold activities   Patient Stated Goals Patient wants to walk more normally with less pain   Currently in  Pain? Yes   Pain Score 5    Pain Location Leg  right ankle   Pain Orientation Right   Pain Descriptors / Indicators Aching   Pain Onset More than a month ago     Posture: Right ankle in eversion, unable to actively or passively obtain neutral position, pes planus  AROM:  Right ankle DF to 0, PF 40 degrees, inversion negative motion with right ankle positioned in eversion 10 degrees + Gait: ambulating with SPC with decreased trunk rotation, decreased weight shift to right LE  Treatment: Modalities: high volt estim. Applied x 30 min. to lower back and right ankle, intensity to tolerance with patient sitting in chair prior to exercise: goal: pain, muscle spasms Moist heat applied to lower back in conjunction with estim. (no adverse reactions noted) Therapeutic  exercise: patient performed exercises with verbal, tactile cues and demonstration of therapist Sitting:  LE ankle DF/PF on rocker board x 2 min. Isometric exercises for right ankle eversion, inversion, PF and DF 10 reps each with green resistive band with repeated instruction/demonstration Core exercises involving UE's with resistive band Demonstrated by therapist with written instructions given for home Scapular retraction, straight arm pull downs, shoulder extension, single arm row with resistive band and weight LAQ each LE x 10 reps Shoulder arnold press, chair push ups x 5 reps with VC  Patient response to treatment: patient demonstrated improved technique with exercises with minimal VC for correct alignment. Patient with decreased pain from  5 /10 to 3 /10 in back/right LE following estim . Improved motor control with repetition and cuing,          PT Education - 07/19/16 1216    Education provided Yes   Education Details HEP: re assessed home exercises given and added core/UE exercises    Person(s) Educated Patient   Methods Explanation;Demonstration;Verbal cues;Handout   Comprehension Verbalized understanding;Returned demonstration;Verbal cues required;Need further instruction             PT Long Term Goals - 06/27/16 1212      PT LONG TERM GOAL #1   Title Patient will improve LEFS score to 40/80 by 08/22/2016 demonstrating improved function with daily tasks and able to stand, sit and walk with less difficulty   Baseline LEFS 17/80   Status New     PT LONG TERM GOAL #2   Title patient will be indepenent with home program for pain control and progressive exercises for flexibiltiy and strength by discharge to continue with self managment by 08/22/2016   Baseline limited knowledge of appropriate pain control strategies and progression of exercises   Status New               Plan - 07/19/16 1225    Clinical Impression Statement Patient is progressing with goals  with decreasing right knee and ankle pain and progressing with exercises with repeated instruction and demonstration to perform exercises correctly. She continues with limited motion right ankle, decreased strength in right LE and core and will benefit from additional physical therapy intervention to address limitations.     Rehab Potential Fair   PT Frequency 2x / week   PT Duration 8 weeks   PT Treatment/Interventions Electrical Stimulation;Moist Heat;Ultrasound;Patient/family education;Neuromuscular re-education;Therapeutic exercise;Manual techniques   PT Next Visit Plan pain control, ther. ex, manual therapy   PT Home Exercise Plan stabilizaiton exercises, strengthening as instructed      Patient will benefit from skilled therapeutic intervention in order to improve the following deficits and impairments:  Decreased strength, Impaired flexibility, Decreased activity tolerance, Impaired perceived functional ability, Pain, Decreased endurance, Difficulty walking  Visit Diagnosis: Right knee pain, unspecified chronicity  Muscle weakness (generalized)  Difficulty in walking, not elsewhere classified     Problem List Patient Active Problem List   Diagnosis Date Noted  . Breast cancer screening 06/18/2016  . Knee pain 04/03/2016  . Hyperkalemia 03/01/2016  . Hyponatremia 03/01/2016  . High triglycerides 12/24/2015  . Depression with anxiety 12/24/2015  . Pes planus of both feet 11/16/2015  . Plantar fasciitis of right foot 11/16/2015  . Ingrown toenail 11/16/2015  . Heel spur 11/16/2015  . Diabetic neuropathy (HCC) 11/16/2015  . Right ankle pain 11/16/2015  . Controlled substance agreement signed 11/16/2015  . Medication monitoring encounter 11/16/2015  . Opiate use 11/16/2015  . Diabetes mellitus without complication (HCC) 08/15/2015  . Need for Tdap vaccination 08/15/2015  . Chronic pain of multiple joints 08/15/2015  . Abnormal mammogram of right breast 06/19/2015  .  Cerumen impaction 03/16/2015  . Hypertension goal BP (blood pressure) < 140/90 12/14/2014  . Hyperlipidemia LDL goal <100 12/14/2014  . History of transfusion of packed RBC 12/14/2014  . COPD, mild (HCC) 12/14/2014  . Cigarette smoker 12/14/2014  . Status post bariatric surgery 12/14/2014  . Obesity 12/14/2014  . Chronic radicular lumbar pain 12/14/2014  . History of GI bleed 11/12/2014  . GERD without esophagitis 11/12/2014  . History of small bowel obstruction 09/07/2014    Beacher May PT 07/19/2016, 3:14 PM  [facility] PHYSICAL AND SPORTS MEDICINE [address], [place], [address] Phone: [phone]   Fax:  [phone]  Name: Andrea Holden MRN: [id number] Date of Birth: 1945/12/28

## 2016-07-23 ENCOUNTER — Ambulatory Visit: Payer: Medicare PPO | Admitting: Physical Therapy

## 2016-07-23 ENCOUNTER — Encounter: Payer: Self-pay | Admitting: Physical Therapy

## 2016-07-23 DIAGNOSIS — M62838 Other muscle spasm: Secondary | ICD-10-CM

## 2016-07-23 DIAGNOSIS — M25561 Pain in right knee: Secondary | ICD-10-CM

## 2016-07-23 DIAGNOSIS — M6281 Muscle weakness (generalized): Secondary | ICD-10-CM

## 2016-07-23 DIAGNOSIS — R262 Difficulty in walking, not elsewhere classified: Secondary | ICD-10-CM

## 2016-07-23 NOTE — Therapy (Signed)
[place] [facility] PHYSICAL AND SPORTS MEDICINE [address] [place], [place], [address] Phone: [phone]   Fax:  [phone]  Physical Therapy Treatment  Patient Details  Name: Andrea Holden MRN: [id number] Date of Birth: [birth year] Referring Provider: Juanell FairlyKrasinski, Kevin MD  Encounter Date: 07/23/2016      PT End of Session - 07/23/16 1020    Visit Number 4   Number of Visits 16   Date for PT Re-Evaluation 08/22/16   Authorization Type 4   Authorization Time Period 10 (G code)   PT Start Time 0910   PT Stop Time 0955   PT Time Calculation (min) 45 min   Activity Tolerance Patient tolerated treatment well   Behavior [facility] tasks assessed/performed      Past Medical History:  Diagnosis Date  . Allergy    seasonal allergies  . Anemia   . Anxiety   . Arthritis   . Asthma    allergy induced asthma  . Cataract   . Clotting disorder (HCC)    history of blood clots  . Complication of anesthesia    arrhythmia following colonoscopy  . Degenerative disc disease, lumbar   . Degenerative disc disease, lumbar   . Depression   . Diabetes mellitus   . Diabetic neuropathy (HCC) 11/16/2015  . Dysrhythmia   . GERD (gastroesophageal reflux disease)   . H/O eating disorder   . H/O transfusion    [facility]  . History of chicken pox   . History of hiatal hernia   . History of measles, mumps, or rubella   . HOH (hard of hearing)   . Hyperlipidemia   . Hypertension   . Irregular heartbeat   . Moderate COPD (chronic obstructive pulmonary disease) (HCC) 12/14/2014  . Neuropathy (HCC)   . Opiate use 11/16/2015  . Orthopnea   . Pes planus of both feet 11/16/2015  . Plantar fasciitis of right foot 11/16/2015  . Wheezing     Past Surgical History:  Procedure Laterality Date  . ABDOMINAL HYSTERECTOMY    . CATARACT EXTRACTION W/PHACO Right 05/30/2015   Procedure: CATARACT  EXTRACTION PHACO AND INTRAOCULAR LENS PLACEMENT (IOC);  Surgeon: Galen ManilaWilliam Porfilio, MD;  Location: [facility] ORS;  Service: Ophthalmology;  Laterality: Right;  US 00:57 AP% 20.9 CDE 11.99 fluid pack lot #[id number]  . CHOLECYSTECTOMY    . COLON SURGERY     blocked colon  . GALLBLADDER SURGERY    . GASTRIC BYPASS  2010  . internal bleeding  2016  . MOUTH SURGERY    . OVARY SURGERY    . TONSILLECTOMY      There were no vitals filed for this visit.      Subjective Assessment - 07/23/16 0914    Subjective Patient reports she is still improving with right knee and leg pain and her ankle is feeling some better. Her back is still bothering her. She reports the estim seemed to help with overall pain in her right leg.    Limitations Sitting;Standing;Walking;House hold activities   Patient Stated Goals Patient wants to walk more normally with less pain   Currently in Pain? Yes   Pain Score 3    Pain Location Leg   Pain Orientation Right   Pain Descriptors / Indicators Aching   Pain Type Chronic pain   Pain Onset More than a month ago   Pain Frequency Constant  Objective:  Right ankle in eversion, unable to actively or passively obtain neutral position, pes planus  Gait: ambulating with SPC with decreased trunk rotation, decreased weight shift to right LE Palpation: point tender with spasms along lower back/lumbar spine with radiating pain into right LE/hip  Treatment: Modalities: high volt estim. Applied x 30 min. to lower back intensity to tolerance with patient sitting in chair prior to exercise: goal: pain, muscle spasms Moist heat applied to lower back in conjunction with estim. (no adverse reactions noted) Therapeutic exercise: patient performed exercises with verbal, tactile cues and demonstration of therapist Sitting:  LE ankle DF/PF on rocker board and inversion/eversion with balance stones on top x 2 min. Each direction Calf stretch off balance stones x 20 seconds LAQ with 3#  weights on ankles 2 x 15 reps Resistive hamstring curls with green band and assistance 2 x 15 reps Hip abduction in sitting with manual resistance of therapist x 15 reps Calf raises off balance stones (sitting) with 3# weights on thighs x 20 reps  Patient response to treatment: Patient demonstrated improved technique with exercises with minimal VC for correct alignment and technique. Patient with decreased pain from   3/10 to  <1/10. Improved motor control with repetition and cuing with reported fatigue in left LE with all exercises         PT Education - 07/23/16 1027    Education provided Yes   Education Details HEP: re assessed resistive band exercises with demonstration/VC and instructed in use of balance stones for ROM/stretching and standing balance activities   Person(s) Educated Patient   Methods Explanation;Demonstration;Verbal cues   Comprehension Verbalized understanding;Returned demonstration;Verbal cues required;Need further instruction             PT Long Term Goals - 06/27/16 1212      PT LONG TERM GOAL #1   Title Patient will improve LEFS score to 40/80 by 08/22/2016 demonstrating improved function with daily tasks and able to stand, sit and walk with less difficulty   Baseline LEFS 17/80   Status New     PT LONG TERM GOAL #2   Title patient will be indepenent with home program for pain control and progressive exercises for flexibiltiy and strength by discharge to continue with self managment by 08/22/2016   Baseline limited knowledge of appropriate pain control strategies and progression of exercises   Status New               Plan - 07/23/16 1021    Clinical Impression Statement Patient with decreased LE pain with estim. and moist heat application and was able to perform exercises without exacerbation of symptoms in right LE. She is progressing well with current treatment and should continue to improve strength and function with additional physical  therapy intervention.    Rehab Potential Fair   PT Frequency 2x / week   PT Duration 8 weeks   PT Treatment/Interventions Electrical Stimulation;Moist Heat;Ultrasound;Patient/family education;Neuromuscular re-education;Therapeutic exercise;Manual techniques   PT Next Visit Plan pain control, ther. ex, manual therapy   PT Home Exercise Plan stabilization exercises, strengthening as instructed for right LE/ankle      Patient will benefit from skilled therapeutic intervention in order to improve the following deficits and impairments:  Decreased strength, Impaired flexibility, Decreased activity tolerance, Impaired perceived functional ability, Pain, Decreased endurance, Difficulty walking  Visit Diagnosis: Right knee pain, unspecified chronicity  Muscle weakness (generalized)  Difficulty in walking, not elsewhere classified  Other muscle spasm  Problem List Patient Active Problem List   Diagnosis Date Noted  . Breast cancer screening 06/18/2016  . Knee pain 04/03/2016  . Hyperkalemia 03/01/2016  . Hyponatremia 03/01/2016  . High triglycerides 12/24/2015  . Depression with anxiety 12/24/2015  . Pes planus of both feet 11/16/2015  . Plantar fasciitis of right foot 11/16/2015  . Ingrown toenail 11/16/2015  . Heel spur 11/16/2015  . Diabetic neuropathy (HCC) 11/16/2015  . Right ankle pain 11/16/2015  . Controlled substance agreement signed 11/16/2015  . Medication monitoring encounter 11/16/2015  . Opiate use 11/16/2015  . Diabetes mellitus without complication (HCC) 08/15/2015  . Need for Tdap vaccination 08/15/2015  . Chronic pain of multiple joints 08/15/2015  . Abnormal mammogram of right breast 06/19/2015  . Cerumen impaction 03/16/2015  . Hypertension goal BP (blood pressure) < 140/90 12/14/2014  . Hyperlipidemia LDL goal <100 12/14/2014  . History of transfusion of packed RBC 12/14/2014  . COPD, mild (HCC) 12/14/2014  . Cigarette smoker 12/14/2014  . Status  post bariatric surgery 12/14/2014  . Obesity 12/14/2014  . Chronic radicular lumbar pain 12/14/2014  . History of GI bleed 11/12/2014  . GERD without esophagitis 11/12/2014  . History of small bowel obstruction 09/07/2014    Beacher May PT 07/23/2016, 10:28 AM  [place] [facility] PHYSICAL AND SPORTS MEDICINE [address] [place], [address] Phone: [phone]   Fax:  [phone]  Name: Andrea Holden MRN: [id number] Date of Birth: 11-29-1945

## 2016-07-24 ENCOUNTER — Telehealth: Payer: Self-pay

## 2016-07-24 NOTE — Telephone Encounter (Signed)
Patient came in yesterday talked with Orlinda BlalockMiel about her getting ready to run out of Chantix from [employer] assistance program.  I called they stated they sent out meds on dec 8 2 pack 56 ct.  Patient takes 2x times a day so it is expected she will run out a little early.  She is will not get another shipment until Feb 8 and it will consist of 3 paks at that time.  I they states they cannot send anymore at this time she will need to wait for the Feb 8 shipment.  I called left patient voicemail.  Pt called back and I explained and gave her # to [employer].

## 2016-07-25 ENCOUNTER — Encounter: Payer: Self-pay | Admitting: Physical Therapy

## 2016-07-25 ENCOUNTER — Ambulatory Visit: Payer: Medicare PPO | Admitting: Physical Therapy

## 2016-07-25 DIAGNOSIS — M6281 Muscle weakness (generalized): Secondary | ICD-10-CM | POA: Diagnosis not present

## 2016-07-25 DIAGNOSIS — R262 Difficulty in walking, not elsewhere classified: Secondary | ICD-10-CM

## 2016-07-25 DIAGNOSIS — M25561 Pain in right knee: Secondary | ICD-10-CM

## 2016-07-25 NOTE — Therapy (Signed)
[place] [facility] PH[facility] SPORTS MEDICINE [address] [place], [place], [address] Phone: [phone]   Fax:  [phone]  Physical Therapy Treatment  Patient Details  Name: Andrea Holden MRN: [id number] Date of Birth: 1946-04-09 Referring Provider: Juanell FairlyKrasinski, Kevin MD  Encounter Date: 07/25/2016      PT End of Session - 07/25/16 0955    Visit Number 5   Number of Visits 16   Date for PT Re-Evaluation 08/22/16   Authorization Type 5   Authorization Time Period 10 (G code)   PT Start Time [phone]   PT Stop Time 1032   PT Time Calculation (min) 40 min   Activity Tolerance Patient tolerated treatment well   Behavior During Therapy [facility] for tasks assessed/performed      Past Medical History:  Diagnosis Date  . Allergy    seasonal allergies  . Anemia   . Anxiety   . Arthritis   . Asthma    allergy induced asthma  . Cataract   . Clotting disorder (HCC)    history of blood clots  . Complication of anesthesia    arrhythmia following colonoscopy  . Degenerative disc disease, lumbar   . Degenerative disc disease, lumbar   . Depression   . Diabetes mellitus   . Diabetic neuropathy (HCC) 11/16/2015  . Dysrhythmia   . GERD (gastroesophageal reflux disease)   . H/O eating disorder   . H/O transfusion    [facility], blood type O+  . History of chicken pox   . History of hiatal hernia   . History of measles, mumps, or rubella   . HOH (hard of hearing)   . Hyperlipidemia   . Hypertension   . Irregular heartbeat   . Moderate COPD (chronic obstructive pulmonary disease) (HCC) 12/14/2014  . Neuropathy (HCC)   . Opiate use 11/16/2015  . Orthopnea   . Pes planus of both feet 11/16/2015  . Plantar fasciitis of right foot 11/16/2015  . Wheezing     Past Surgical History:  Procedure Laterality Date  . ABDOMINAL HYSTERECTOMY    . CATARACT EXTRACTION W/PHACO Right 05/30/2015   Procedure: CATARACT  EXTRACTION PHACO AND INTRAOCULAR LENS PLACEMENT (IOC);  Surgeon: Galen ManilaWilliam Porfilio, MD;  Location: [facility] ORS;  Service: Ophthalmology;  Laterality: Right;  US 00:57 AP% 20.9 CDE 11.99 fluid pack lot #[id number] H  . CHOLECYSTECTOMY    . COLON SURGERY     blocked colon  . GALLBLADDER SURGERY    . GASTRIC BYPASS  2010  . internal bleeding  2016  . MOUTH SURGERY    . OVARY SURGERY    . TONSILLECTOMY      There were no vitals filed for this visit.      Subjective Assessment - 07/25/16 0953    Subjective Having a good day today and reports she is exercising daily and seeing some improvement. She continues with spasms in neck and back.    Limitations Sitting;Standing;Walking;House hold activities   Patient Stated Goals Patient wants to walk more normally with less pain   Currently in Pain? Yes   Pain Score 2    Pain Location Leg   Pain Orientation Right   Pain Descriptors / Indicators Aching;Discomfort   Pain Type Chronic pain   Pain Onset More than a month ago   Pain Frequency Constant        Objective:  Right ankle positioned in full everted position, unable to  actively move to neutral Gait: ambulating with SPC with decreased trunk rotation, decreased weight shift to right LE, improved cadence as compared to previous session   Treatment: Moist heat applied to right UE during session to assist with spasms/pain control (no adverse reactions noted) Therapeutic exercise: patient performed exercises with verbal, tactile cues and demonstration of therapist Sitting:  LE ankle DF/PF on rocker board and inversion/eversion with balance stones on top x 2 min. Each direction Calf stretch off balance stones x 20 seconds LAQ with 3# weights on ankles 2 x 15 reps Resistive hamstring curls with green band and assistance 2 x 15 reps Hip abduction in sitting with manual resistance of therapist x 15 reps Calf raises off balance stones (sitting) with 3# weights on thighs 2 x 15 reps  Patient  response to treatment: Patient demonstrated good technique with exercises with moderate VC and assistance of therapist for correct alignment of hip/knee. Improved motor control with repetition and reported fatigue in left LE with most exercises today, decreased as compared to previous session.           PT Education - 07/25/16 1030    Education provided Yes   Education Details HEP: re assessed exercises to perform at home   Person(s) Educated Patient   Methods Explanation;Demonstration;Verbal cues   Comprehension Verbalized understanding;Returned demonstration;Need further instruction;Verbal cues required             PT Long Term Goals - 06/27/16 1212      PT LONG TERM GOAL #1   Title Patient will improve LEFS score to 40/80 by 08/22/2016 demonstrating improved function with daily tasks and able to stand, sit and walk with less difficulty   Baseline LEFS 17/80   Status New     PT LONG TERM GOAL #2   Title patient will be indepenent with home program for pain control and progressive exercises for flexibiltiy and strength by discharge to continue with self managment by 08/22/2016   Baseline limited knowledge of appropriate pain control strategies and progression of exercises   Status New               Plan - 07/25/16 1033    Clinical Impression Statement Patient is progressing well with improvement noted in strength and ability to perform daily tasks with less difficulty. She is compliant with home program. She continues with weakness and limited mobility with walking and decreased ability to stand for long periods of time due to right ankle instability and pain and will benefit from continued physical therapy to improve strength and function    PT Frequency 2x / week   PT Duration 8 weeks   PT Treatment/Interventions Electrical Stimulation;Moist Heat;Ultrasound;Patient/family education;Neuromuscular re-education;Therapeutic exercise;Manual techniques   PT Next Visit Plan  pain control, ther. ex, manual therapy   PT Home Exercise Plan stabilization exercises, strengthening as instructed for right LE/ankle      Patient will benefit from skilled therapeutic intervention in order to improve the following deficits and impairments:  Decreased strength, Impaired flexibility, Decreased activity tolerance, Impaired perceived functional ability, Pain, Decreased endurance, Difficulty walking  Visit Diagnosis: Muscle weakness (generalized)  Right knee pain, unspecified chronicity  Difficulty in walking, not elsewhere classified     Problem List Patient Active Problem List   Diagnosis Date Noted  . Breast cancer screening 06/18/2016  . Knee pain 04/03/2016  . Hyperkalemia 03/01/2016  . Hyponatremia 03/01/2016  . High triglycerides 12/24/2015  . Depression with anxiety 12/24/2015  . Pes planus of  both feet 11/16/2015  . Plantar fasciitis of right foot 11/16/2015  . Ingrown toenail 11/16/2015  . Heel spur 11/16/2015  . Diabetic neuropathy (HCC) 11/16/2015  . Right ankle pain 11/16/2015  . Controlled substance agreement signed 11/16/2015  . Medication monitoring encounter 11/16/2015  . Opiate use 11/16/2015  . Diabetes mellitus without complication (HCC) 08/15/2015  . Need for Tdap vaccination 08/15/2015  . Chronic pain of multiple joints 08/15/2015  . Abnormal mammogram of right breast 06/19/2015  . Cerumen impaction 03/16/2015  . Hypertension goal BP (blood pressure) < 140/90 12/14/2014  . Hyperlipidemia LDL goal <100 12/14/2014  . History of transfusion of packed RBC 12/14/2014  . COPD, mild (HCC) 12/14/2014  . Cigarette smoker 12/14/2014  . Status post bariatric surgery 12/14/2014  . Obesity 12/14/2014  . Chronic radicular lumbar pain 12/14/2014  . History of GI bleed 11/12/2014  . GERD without esophagitis 11/12/2014  . History of small bowel obstruction 09/07/2014    Beacher May PT 07/26/2016, 12:10 PM  [place] [facility]  [facility] PHYSICAL AND SPORTS MEDICINE [address] [place], [address] Phone: [phone]   Fax:  [phone]  Name: Andrea Holden MRN: [id number] Date of Birth: 24-Jun-1946

## 2016-07-30 ENCOUNTER — Ambulatory Visit: Payer: Medicare PPO | Admitting: Physical Therapy

## 2016-07-31 ENCOUNTER — Encounter: Payer: Self-pay | Admitting: Physical Therapy

## 2016-07-31 ENCOUNTER — Ambulatory Visit: Payer: Medicare PPO | Admitting: Physical Therapy

## 2016-07-31 DIAGNOSIS — M25561 Pain in right knee: Secondary | ICD-10-CM

## 2016-07-31 DIAGNOSIS — R262 Difficulty in walking, not elsewhere classified: Secondary | ICD-10-CM

## 2016-07-31 DIAGNOSIS — M6281 Muscle weakness (generalized): Secondary | ICD-10-CM | POA: Diagnosis not present

## 2016-07-31 NOTE — Therapy (Signed)
[facility] [address] [place], [address] Phone: [phone]   Fax:  [phone]  Physical Therapy Treatment  Patient Details  Name: Andrea Holden MRN: [id number] Date of Birth: 01/24/1946 Referring Provider: [name] MD  Encounter Date: 07/31/2016      PT End of Session - 07/31/16 1148    Visit Number 6   Number of Visits 16   Date for PT Re-Evaluation 08/22/16   Authorization Type 6   Authorization Time Period 10 (G code)   PT Start Time 1130   PT Stop Time 1214   PT Time Calculation (min) 44 min   Activity Tolerance Patient tolerated treatment well   Behavior During Therapy [facility] for tasks assessed/performed      Past Medical History:  Diagnosis Date  . Allergy    seasonal allergies  . Anemia   . Anxiety   . Arthritis   . Asthma    allergy induced asthma  . Cataract   . Clotting disorder (HCC)    history of blood clots  . Complication of anesthesia    arrhythmia following colonoscopy  . Degenerative disc disease, lumbar   . Degenerative disc disease, lumbar   . Depression   . Diabetes mellitus   . Diabetic neuropathy (HCC) 11/16/2015  . Dysrhythmia   . GERD (gastroesophageal reflux disease)   . H/O eating disorder   . H/O transfusion    patient was given 5 units of blood while at [facility], blood type O+  . History of chicken pox   . History of hiatal hernia   . History of measles, mumps, or rubella   . HOH (hard of hearing)   . Hyperlipidemia   . Hypertension   . Irregular heartbeat   . Moderate COPD (chronic obstructive pulmonary disease) (HCC) 12/14/2014  . Neuropathy (HCC)   . Opiate use 11/16/2015  . Orthopnea   . Pes planus of both feet 11/16/2015  . Plantar fasciitis of right foot 11/16/2015  . Wheezing     Past Surgical History:  Procedure Laterality Date  . ABDOMINAL HYSTERECTOMY    . CATARACT EXTRACTION W/PHACO Right 05/30/2015   Procedure: CATARACT  EXTRACTION PHACO AND INTRAOCULAR LENS PLACEMENT (IOC);  Surgeon: Galen Manila, MD;  Location: [facility] ORS;  Service: Ophthalmology;  Laterality: Right;  [place] 00:57 AP% 20.9 CDE 11.99 fluid pack lot #[id number]  . CHOLECYSTECTOMY    . COLON SURGERY     blocked colon  . GALLBLADDER SURGERY    . GASTRIC BYPASS  2010  . internal bleeding  2016  . MOUTH SURGERY    . OVARY SURGERY    . TONSILLECTOMY      There were no vitals filed for this visit.      Subjective Assessment - 07/31/16 1145    Subjective Patient reports she is feeling improvemnet with strenth in LE's and is having increased lower back symptoms today.    Limitations Sitting;Standing;Walking;House hold activities   Patient Stated Goals Patient wants to walk more normally with less pain   Currently in Pain? Yes   Pain Score 4    Pain Location Leg  and lower back   Pain Orientation Right   Pain Descriptors / Indicators Aching;Discomfort   Pain Type Chronic pain   Pain Onset More than a month ago   Pain Frequency Intermittent      Objective:  Gait: ambulating with SPC with decreased trunk rotation, right ankle pronated/everted, improved  cadence and increased hip/knee flexion right for foot clearance with swing phase Palpation: point tender lower lumbar spine bilaterally with radiating symptoms into right LE  Treatment: Modalities: Electrical stimulation applied to lumbar spine high volt for spasm reduction, intensity to tolerance (~ 150 volts) with moist heat to same with patient seated in chair in conjunction with exercises for LE's: 30 minutes: no adverse skin reaction noted  Therapeutic exercise: patient performed exercises with verbal, tactile cues and demonstration of therapist Sitting:  LE ankle DF/PF on rocker board and inversion/eversion x 2 min. Each direction Calf stretch off balance stones x 30 seconds LAQ with 2# weights on ankles 2 x 20 reps Resistive hamstring curls with green band and assistance 2 x 15  reps Hip abduction in sitting with manual resistance of therapist x 15 reps Calf raises off balance stones (sitting) with 2# weights on thighs 2 x 15 reps  Patient response to treatment: patient demonstrated improved technique with exercises with minimal VC for correct alignment. Patient with decreased pain from 4/10 to  2/10 in lumbar spine/right LE following estim and moist heat treatment. Improved motor control with repetition and cuing for all exercises         PT Education - 07/31/16 1147    Education provided Yes   Education Details HEP: re assessed home exercises to continue with for stretching hamstring, ROM    Person(s) Educated Patient   Methods Explanation;Demonstration;Verbal cues   Comprehension Verbalized understanding;Returned demonstration;Need further instruction;Verbal cues required             PT Long Term Goals - 06/27/16 1212      PT LONG TERM GOAL #1   Title Patient will improve LEFS score to 40/80 by 08/22/2016 demonstrating improved function with daily tasks and able to stand, sit and walk with less difficulty   Baseline LEFS 17/80   Status New     PT LONG TERM GOAL #2   Title patient will be indepenent with home program for pain control and progressive exercises for flexibiltiy and strength by discharge to continue with self managment by 08/22/2016   Baseline limited knowledge of appropriate pain control strategies and progression of exercises   Status New               Plan - 07/31/16 1152    Clinical Impression Statement Patient is progressing well with goals with improvement noted with strength and endurance with functional activties including walking and standing. she continues with decreased endurance and strength in LE's, trunk and will benefit from continued physical therapy intervention to achieve goals amd maximize function in preparation for right foot surgery.    Rehab Potential Fair   PT Frequency 2x / week   PT Duration 8 weeks    PT Treatment/Interventions Electrical Stimulation;Moist Heat;Ultrasound;Patient/family education;Neuromuscular re-education;Therapeutic exercise;Manual techniques   PT Next Visit Plan pain control, ther. ex, manual therapy   PT Home Exercise Plan stabilization exercises, strengthening as instructed for right LE/ankle      Patient will benefit from skilled therapeutic intervention in order to improve the following deficits and impairments:  Decreased strength, Impaired flexibility, Decreased activity tolerance, Impaired perceived functional ability, Pain, Decreased endurance, Difficulty walking  Visit Diagnosis: Muscle weakness (generalized)  Right knee pain, unspecified chronicity  Difficulty in walking, not elsewhere classified     Problem List Patient Active Problem List   Diagnosis Date Noted  . Breast cancer screening 06/18/2016  . Knee pain 04/03/2016  . Hyperkalemia 03/01/2016  . Hyponatremia  03/01/2016  . High triglycerides 12/24/2015  . Depression with anxiety 12/24/2015  . Pes planus of both feet 11/16/2015  . Plantar fasciitis of right foot 11/16/2015  . Ingrown toenail 11/16/2015  . Heel spur 11/16/2015  . Diabetic neuropathy (HCC) 11/16/2015  . Right ankle pain 11/16/2015  . Controlled substance agreement signed 11/16/2015  . Medication monitoring encounter 11/16/2015  . Opiate use 11/16/2015  . Diabetes mellitus without complication (HCC) 08/15/2015  . Need for Tdap vaccination 08/15/2015  . Chronic pain of multiple joints 08/15/2015  . Abnormal mammogram of right breast 06/19/2015  . Cerumen impaction 03/16/2015  . Hypertension goal BP (blood pressure) < 140/90 12/14/2014  . Hyperlipidemia LDL goal <100 12/14/2014  . History of transfusion of packed RBC 12/14/2014  . COPD, mild (HCC) 12/14/2014  . Cigarette smoker 12/14/2014  . Status post bariatric surgery 12/14/2014  . Obesity 12/14/2014  . Chronic radicular lumbar pain 12/14/2014  . History of GI  bleed 11/12/2014  . GERD without esophagitis 11/12/2014  . History of small bowel obstruction 09/07/2014    Beacher MayBrooks, Issiac Jamar PT 07/31/2016, 12:18 PM  [facility] [address] [place], [place], [address] Phone: [phone]   Fax:  [phone]  Name: Andrea Holden MRN: [id number] Date of Birth: [birth year]

## 2016-08-02 ENCOUNTER — Encounter: Payer: Self-pay | Admitting: Physical Therapy

## 2016-08-02 ENCOUNTER — Ambulatory Visit: Payer: Medicare PPO | Attending: Orthopedic Surgery | Admitting: Physical Therapy

## 2016-08-02 DIAGNOSIS — M62838 Other muscle spasm: Secondary | ICD-10-CM | POA: Insufficient documentation

## 2016-08-02 DIAGNOSIS — M25561 Pain in right knee: Secondary | ICD-10-CM | POA: Diagnosis present

## 2016-08-02 DIAGNOSIS — R262 Difficulty in walking, not elsewhere classified: Secondary | ICD-10-CM | POA: Insufficient documentation

## 2016-08-02 DIAGNOSIS — M6281 Muscle weakness (generalized): Secondary | ICD-10-CM | POA: Diagnosis present

## 2016-08-02 NOTE — Therapy (Signed)
[facility] [address] [place], [place], [address] Phone: [phone]   Fax:  [phone]  Physical Therapy Treatment  Patient Details  Name: Andrea Holden MRN: [id number] Date of Birth: [birth year] Referring Provider: Juanell FairlyKrasinski, Kevin MD  Encounter Date: 08/02/2016      PT End of Session - 08/02/16 1014    Visit Number 7   Number of Visits 16   Date for PT Re-Evaluation 08/22/16   Authorization Type 7   Authorization Time Period 10 (G code)   PT Start Time 0950   PT Stop Time 1034   PT Time Calculation (min) 44 min   Activity Tolerance Patient tolerated treatment well   Behavior [facility] for tasks assessed/performed      Past Medical History:  Diagnosis Date  . Allergy    seasonal allergies  . Anemia   . Anxiety   . Arthritis   . Asthma    allergy induced asthma  . Cataract   . Clotting disorder (HCC)    history of blood clots  . Complication of anesthesia    arrhythmia following colonoscopy  . Degenerative disc disease, lumbar   . Degenerative disc disease, lumbar   . Depression   . Diabetes mellitus   . Diabetic neuropathy (HCC) 11/16/2015  . Dysrhythmia   . GERD (gastroesophageal reflux disease)   . H/O eating disorder   . H/O transfusion    [facility]  . History of chicken pox   . History of hiatal hernia   . History of measles, mumps, or rubella   . HOH (hard of hearing)   . Hyperlipidemia   . Hypertension   . Irregular heartbeat   . Moderate COPD (chronic obstructive pulmonary disease) (HCC) 12/14/2014  . Neuropathy (HCC)   . Opiate use 11/16/2015  . Orthopnea   . Pes planus of both feet 11/16/2015  . Plantar fasciitis of right foot 11/16/2015  . Wheezing     Past Surgical History:  Procedure Laterality Date  . ABDOMINAL HYSTERECTOMY    . CATARACT EXTRACTION W/PHACO Right 05/30/2015   Procedure: CATARACT  EXTRACTION PHACO AND INTRAOCULAR LENS PLACEMENT (IOC);  Surgeon: Galen ManilaWilliam Porfilio, MD;  Location: [facility] ORS;  Service: Ophthalmology;  Laterality: Right;  US 00:57 AP% 20.9 CDE 11.99 fluid pack lot #[id number]  . CHOLECYSTECTOMY    . COLON SURGERY     blocked colon  . GALLBLADDER SURGERY    . GASTRIC BYPASS  2010  . internal bleeding  2016  . MOUTH SURGERY    . OVARY SURGERY    . TONSILLECTOMY      There were no vitals filed for this visit.      Subjective Assessment - 08/02/16 1007    Subjective Patient reports she is having increased back pain today.    Limitations Sitting;Standing;Walking;House hold activities   Patient Stated Goals Patient wants to walk more normally with less pain   Currently in Pain? Yes   Pain Score 5    Pain Location Leg  right LE   Pain Orientation Right   Pain Descriptors / Indicators Aching;Discomfort   Pain Type Chronic pain   Pain Onset More than a month ago   Pain Frequency Intermittent      Objective:  Gait: ambulating with SPC with decreased trunk rotation, right ankle pronated/everted, improved cadence and increased hip/knee flexion right for foot clearance with  swing phase Palpation: point tender lower lumbar spine bilaterally with radiating symptoms into right LE  Treatment: Modalities: Electrical stimulation applied to lumbar spine high volt for spasm reduction, intensity to tolerance (~ 150 volts) with moist heat to same with patient seated in chair in conjunction with exercises for LE's: 30 minutes: no adverse skin reaction noted  Therapeutic exercise: patient performed exercises with verbal, tactile cues and demonstration of therapist Sitting:  LE ankle DF/PF on rocker board and inversion/eversion x 2 min. Each direction Calf stretch off balance stones x 30 seconds LAQ with 2# weights on ankles 2 x 20 reps Resistive hamstring curls with green band and assistance 2 x 15 reps Hip abduction in sitting with manual resistance of  therapist x 15 reps Calf raises off balance stones (sitting) with 2# weights on thighs 2 x 15 reps  Patient response to treatment: Patient demonstrated improved endurance and ability to perform exercise with minimal VC for correct alignment and technique, No increased pain reported in right LE with exercises, decreased spasms to mild and pain decreased to <3/10 in back/right LE at end of session with improved gait pattern, decreased guarded posture.           PT Education - 08/02/16 1011    Education provided Yes   Education Details HEP: continue with strengthening and ROM and positioning to decrease strain on knee, ankle, back   Person(s) Educated Patient   Methods Explanation;Demonstration;Verbal cues   Comprehension Verbalized understanding;Returned demonstration;Verbal cues required             PT Long Term Goals - 06/27/16 1212      PT LONG TERM GOAL #1   Title Patient will improve LEFS score to 40/80 by 08/22/2016 demonstrating improved function with daily tasks and able to stand, sit and walk with less difficulty   Baseline LEFS 17/80   Status New     PT LONG TERM GOAL #2   Title patient will be indepenent with home program for pain control and progressive exercises for flexibiltiy and strength by discharge to continue with self managment by 08/22/2016   Baseline limited knowledge of appropriate pain control strategies and progression of exercises   Status New               Plan - 08/02/16 1317    Clinical Impression Statement Patient with improving endurance and strength as demonstrated by ability to perform exercises with less rest periods and less cuing of therapist. Patient continues with right LE pain and weakness due to right ankle problems and back pain radiating into right LE. She should continue to improve with additional physical therapy intervention to address limitaiotns.    Rehab Potential Fair   PT Frequency 2x / week   PT Duration 8 weeks   PT  Treatment/Interventions Electrical Stimulation;Moist Heat;Ultrasound;Patient/family education;Neuromuscular re-education;Therapeutic exercise;Manual techniques   PT Next Visit Plan pain control, ther. ex, manual therapy   PT Home Exercise Plan stabilization exercises, strengthening as instructed for right LE/ankle      Patient will benefit from skilled therapeutic intervention in order to improve the following deficits and impairments:  Decreased strength, Impaired flexibility, Decreased activity tolerance, Impaired perceived functional ability, Pain, Decreased endurance, Difficulty walking  Visit Diagnosis: Muscle weakness (generalized)  Difficulty in walking, not elsewhere classified  Other muscle spasm  Right knee pain, unspecified chronicity     Problem List Patient Active Problem List   Diagnosis Date Noted  . Breast cancer screening 06/18/2016  . Knee  pain 04/03/2016  . Hyperkalemia 03/01/2016  . Hyponatremia 03/01/2016  . High triglycerides 12/24/2015  . Depression with anxiety 12/24/2015  . Pes planus of both feet 11/16/2015  . Plantar fasciitis of right foot 11/16/2015  . Ingrown toenail 11/16/2015  . Heel spur 11/16/2015  . Diabetic neuropathy (HCC) 11/16/2015  . Right ankle pain 11/16/2015  . Controlled substance agreement signed 11/16/2015  . Medication monitoring encounter 11/16/2015  . Opiate use 11/16/2015  . Diabetes mellitus without complication (HCC) 08/15/2015  . Need for Tdap vaccination 08/15/2015  . Chronic pain of multiple joints 08/15/2015  . Abnormal mammogram of right breast 06/19/2015  . Cerumen impaction 03/16/2015  . Hypertension goal BP (blood pressure) < 140/90 12/14/2014  . Hyperlipidemia LDL goal <100 12/14/2014  . History of transfusion of packed RBC 12/14/2014  . COPD, mild (HCC) 12/14/2014  . Cigarette smoker 12/14/2014  . Status post bariatric surgery 12/14/2014  . Obesity 12/14/2014  . Chronic radicular lumbar pain 12/14/2014  .  History of GI bleed 11/12/2014  . GERD without esophagitis 11/12/2014  . History of small bowel obstruction 09/07/2014    Beacher May PT 08/02/2016, 1:21 PM  [facility] PHYS[facility] AND SPORTS MEDICINE [address], [place], [address] Phone: [phone]   Fax:  [phone]  Name: MARAYAH HIGDON MRN: [id number] Date of Birth: 03-Dec-1945

## 2016-08-06 ENCOUNTER — Ambulatory Visit: Payer: Medicare PPO | Admitting: Physical Therapy

## 2016-08-09 ENCOUNTER — Encounter: Payer: Self-pay | Admitting: Physical Therapy

## 2016-08-09 ENCOUNTER — Ambulatory Visit: Payer: Medicare PPO | Admitting: Physical Therapy

## 2016-08-09 DIAGNOSIS — R262 Difficulty in walking, not elsewhere classified: Secondary | ICD-10-CM

## 2016-08-09 DIAGNOSIS — M6281 Muscle weakness (generalized): Secondary | ICD-10-CM

## 2016-08-09 DIAGNOSIS — M25561 Pain in right knee: Secondary | ICD-10-CM

## 2016-08-09 NOTE — Therapy (Signed)
[facility] [address] [place], [address] Phone: [phone]   Fax:  [phone]  Physical Therapy Treatment  Patient Details  Name: Andrea Holden MRN: [id number] Date of Birth: 08/03/1945 Referring Provider: [name] MD  Encounter Date: 08/09/2016      PT End of Session - 08/09/16 1050    Visit Number 8   Number of Visits 16   Date for PT Re-Evaluation 08/22/16   Authorization Type 8   Authorization Time Period 10 (G code)   PT Start Time 1003   PT Stop Time 1050   PT Time Calculation (min) 47 min   Activity Tolerance Patient tolerated treatment well   Behavior During Therapy [facility] for tasks assessed/performed      Past Medical History:  Diagnosis Date  . Allergy    seasonal allergies  . Anemia   . Anxiety   . Arthritis   . Asthma    allergy induced asthma  . Cataract   . Clotting disorder (HCC)    history of blood clots  . Complication of anesthesia    arrhythmia following colonoscopy  . Degenerative disc disease, lumbar   . Degenerative disc disease, lumbar   . Depression   . Diabetes mellitus   . Diabetic neuropathy (HCC) 11/16/2015  . Dysrhythmia   . GERD (gastroesophageal reflux disease)   . H/O eating disorder   . H/O transfusion    patient was given 5 units of blood while at [facility], blood type O+  . History of chicken pox   . History of hiatal hernia   . History of measles, mumps, or rubella   . HOH (hard of hearing)   . Hyperlipidemia   . Hypertension   . Irregular heartbeat   . Moderate COPD (chronic obstructive pulmonary disease) (HCC) 12/14/2014  . Neuropathy (HCC)   . Opiate use 11/16/2015  . Orthopnea   . Pes planus of both feet 11/16/2015  . Plantar fasciitis of right foot 11/16/2015  . Wheezing     Past Surgical History:  Procedure Laterality Date  . ABDOMINAL HYSTERECTOMY    . CATARACT EXTRACTION W/PHACO Right 05/30/2015   Procedure: CATARACT  EXTRACTION PHACO AND INTRAOCULAR LENS PLACEMENT (IOC);  Surgeon: Galen Manila, MD;  Location: [facility] ORS;  Service: Ophthalmology;  Laterality: Right;  [place] 00:57 AP% 20.9 CDE 11.99 fluid pack lot #[id number]  . CHOLECYSTECTOMY    . COLON SURGERY     blocked colon  . GALLBLADDER SURGERY    . GASTRIC BYPASS  2010  . internal bleeding  2016  . MOUTH SURGERY    . OVARY SURGERY    . TONSILLECTOMY      There were no vitals filed for this visit.      Subjective Assessment - 08/09/16 1003    Subjective Patient reports she is having increased back pain today. taking care of home and dogs.   Limitations Sitting;Standing;Walking;House hold activities   Patient Stated Goals Patient wants to walk more normally with less pain   Currently in Pain? Yes   Pain Score 5    Pain Location Leg   Pain Orientation Right   Pain Descriptors / Indicators Aching   Pain Type Chronic pain   Pain Onset More than a month ago   Pain Frequency Intermittent      Objective:  Gait: SPC slow cadence, antalgic gait pattern Palpation: point tender lower lumbar spine bilaterally with radiating symptoms into right LE  Treatment: Modalities: Electrical stimulation applied to lumbar spine high volt for spasm reduction, intensity to tolerance (~ 150 volts) with moist heat to same with patient seated in chair in conjunction with exercises for LE's: 30 minutes: no adverse skin reaction noted  Therapeutic exercise: patient performed exercises with verbal, tactile cues and demonstration of therapist Sitting:  LE ankle DF/PF on rocker board and inversion/eversion x 2 min. Each direction Calf stretch off balance stones with 3# weights on thighs x 30 seconds LAQ with 3# weights on ankles 1 x 20reps Resistive hamstring curls with green band and assistance 1 x 15 reps Hip abduction in sitting with manual resistance of therapist x 15 reps Hip adduction with ball and glute sets x 15 reps  Patient response to treatment:  Patient limited by fatigue today. She improved technique and able to perform exercises with guidance of therapist and VC. Reported decreased back and right LE pain following estim. Pain decreased to mild, <3/10  at end of session.        PT Education - 08/09/16 1045    Education provided Yes   Education Details HEP: pain control, AROM, strengthening including hip adduction, ER/abduction, ankel DF/PF   Person(s) Educated Patient   Methods Explanation;Demonstration;Verbal cues   Comprehension Verbalized understanding;Returned demonstration;Verbal cues required             PT Long Term Goals - 06/27/16 1212      PT LONG TERM GOAL #1   Title Patient will improve LEFS score to 40/80 by 08/22/2016 demonstrating improved function with daily tasks and able to stand, sit and walk with less difficulty   Baseline LEFS 17/80   Status New     PT LONG TERM GOAL #2   Title patient will be indepenent with home program for pain control and progressive exercises for flexibiltiy and strength by discharge to continue with self managment by 08/22/2016   Baseline limited knowledge of appropriate pain control strategies and progression of exercises   Status New               Plan - 08/09/16 1050    Clinical Impression Statement Patient demonstrates improvemnt with strength and decreasing pain in right LE. She continues with lower back pain and right LE to ankle pain. She shoul continue to improve with additioanl physical therapy intervention.    PT Frequency 2x / week   PT Duration 8 weeks   PT Treatment/Interventions Electrical Stimulation;Moist Heat;Ultrasound;Patient/family education;Neuromuscular re-education;Therapeutic exercise;Manual techniques   PT Next Visit Plan pain control, ther. ex, manual therapy   PT Home Exercise Plan stabilization exercises, strengthening as instructed for right LE/ankle      Patient will benefit from skilled therapeutic intervention in order to improve the  following deficits and impairments:  Decreased strength, Impaired flexibility, Decreased activity tolerance, Impaired perceived functional ability, Pain, Decreased endurance, Difficulty walking  Visit Diagnosis: Muscle weakness (generalized)  Difficulty in walking, not elsewhere classified  Right knee pain, unspecified chronicity     Problem List Patient Active Problem List   Diagnosis Date Noted  . Breast cancer screening 06/18/2016  . Knee pain 04/03/2016  . Hyperkalemia 03/01/2016  . Hyponatremia 03/01/2016  . High triglycerides 12/24/2015  . Depression with anxiety 12/24/2015  . Pes planus of both feet 11/16/2015  . Plantar fasciitis of right foot 11/16/2015  . Ingrown toenail 11/16/2015  . Heel spur 11/16/2015  . Diabetic neuropathy (HCC) 11/16/2015  . Right ankle pain 11/16/2015  . Controlled substance agreement signed 11/16/2015  .  Medication monitoring encounter 11/16/2015  . Opiate use 11/16/2015  . Diabetes mellitus without complication (HCC) 08/15/2015  . Need for Tdap vaccination 08/15/2015  . Chronic pain of multiple joints 08/15/2015  . Abnormal mammogram of right breast 06/19/2015  . Cerumen impaction 03/16/2015  . Hypertension goal BP (blood pressure) < 140/90 12/14/2014  . Hyperlipidemia LDL goal <100 12/14/2014  . History of transfusion of packed RBC 12/14/2014  . COPD, mild (HCC) 12/14/2014  . Cigarette smoker 12/14/2014  . Status post bariatric surgery 12/14/2014  . Obesity 12/14/2014  . Chronic radicular lumbar pain 12/14/2014  . History of GI bleed 11/12/2014  . GERD without esophagitis 11/12/2014  . History of small bowel obstruction 09/07/2014    Beacher MayBrooks,  PT 08/09/2016, 8:51 PM  [facility] [address] [place], [place], [address] Phone: [phone]   Fax:  [phone]  Name: Andrea Holden MRN: [id number] Date of Birth: 12-Apr-1946

## 2016-08-12 ENCOUNTER — Telehealth: Payer: Self-pay | Admitting: Family Medicine

## 2016-08-12 DIAGNOSIS — G8929 Other chronic pain: Secondary | ICD-10-CM

## 2016-08-12 DIAGNOSIS — M5416 Radiculopathy, lumbar region: Principal | ICD-10-CM

## 2016-08-12 MED ORDER — HYDROCODONE-ACETAMINOPHEN 10-325 MG PO TABS: 0.5000 | ORAL_TABLET | Freq: Two times a day (BID) | ORAL | 0 refills | Status: DC | PRN

## 2016-08-12 NOTE — Telephone Encounter (Signed)
Last visit 06/17/16 [place] web site reviewed for the last 12 months Rx approved, ready for pick-up

## 2016-08-12 NOTE — Telephone Encounter (Signed)
PT IS NEEDING THE REFILL ON VICODIN.

## 2016-08-12 NOTE — Telephone Encounter (Signed)
PT MADE AWARE RX IS UP FRONT READY BUT HER CHANTIX HAS NOT ARRIVED YET. PER JAMIE WAS SENT OUT FEB 8 AND IT TAKES AROUND 7 TO 10 DAYS FOR DELIVERY.

## 2016-08-13 ENCOUNTER — Encounter: Payer: Medicare Other | Admitting: Physical Therapy

## 2016-08-16 ENCOUNTER — Ambulatory Visit: Payer: Medicare PPO | Admitting: Physical Therapy

## 2016-08-19 ENCOUNTER — Ambulatory Visit: Payer: Medicare Other | Admitting: Family Medicine

## 2016-08-20 ENCOUNTER — Encounter: Payer: Medicare Other | Admitting: Physical Therapy

## 2016-08-21 ENCOUNTER — Encounter: Payer: Self-pay | Admitting: Physical Therapy

## 2016-08-21 ENCOUNTER — Ambulatory Visit: Payer: Medicare PPO | Admitting: Physical Therapy

## 2016-08-21 DIAGNOSIS — R262 Difficulty in walking, not elsewhere classified: Secondary | ICD-10-CM

## 2016-08-21 DIAGNOSIS — M62838 Other muscle spasm: Secondary | ICD-10-CM

## 2016-08-21 DIAGNOSIS — M25561 Pain in right knee: Secondary | ICD-10-CM

## 2016-08-21 DIAGNOSIS — M6281 Muscle weakness (generalized): Secondary | ICD-10-CM | POA: Diagnosis not present

## 2016-08-22 NOTE — Therapy (Signed)
[place] [facility] [address] [place], [address] Phone: [phone]   Fax:  [phone]  Physical Therapy Treatment  Patient Details  Name: Andrea Holden MRN: [id number] Date of Birth: 1946/03/08 Referring Provider: [name] MD  Encounter Date: 08/21/2016      PT End of Session - 08/21/16 1726    Visit Number 9   Number of Visits 16   Date for PT Re-Evaluation 08/22/16   Authorization Type 9   Authorization Time Period 10 (G code)   PT Start Time 1710   PT Stop Time 1745   PT Time Calculation (min) 35 min   Activity Tolerance Patient tolerated treatment well   Behavior During Therapy [facility] for tasks assessed/performed      Past Medical History:  Diagnosis Date  . Allergy    seasonal allergies  . Anemia   . Anxiety   . Arthritis   . Asthma    allergy induced asthma  . Cataract   . Clotting disorder (HCC)    history of blood clots  . Complication of anesthesia    arrhythmia following colonoscopy  . Degenerative disc disease, lumbar   . Degenerative disc disease, lumbar   . Depression   . Diabetes mellitus   . Diabetic neuropathy (HCC) 11/16/2015  . Dysrhythmia   . GERD (gastroesophageal reflux disease)   . H/O eating disorder   . H/O transfusion    patient was given 5 units of blood while at [facility], blood type O+  . History of chicken pox   . History of hiatal hernia   . History of measles, mumps, or rubella   . HOH (hard of hearing)   . Hyperlipidemia   . Hypertension   . Irregular heartbeat   . Moderate COPD (chronic obstructive pulmonary disease) (HCC) 12/14/2014  . Neuropathy (HCC)   . Opiate use 11/16/2015  . Orthopnea   . Pes planus of both feet 11/16/2015  . Plantar fasciitis of right foot 11/16/2015  . Wheezing     Past Surgical History:  Procedure Laterality Date  . ABDOMINAL HYSTERECTOMY    . CATARACT EXTRACTION W/PHACO Right 05/30/2015   Procedure: CATARACT  EXTRACTION PHACO AND INTRAOCULAR LENS PLACEMENT (IOC);  Surgeon: Galen Manila, MD;  Location: [facility] ORS;  Service: Ophthalmology;  Laterality: Right;  [place] 00:57 AP% 20.9 CDE 11.99 fluid pack lot #[id number] H  . CHOLECYSTECTOMY    . COLON SURGERY     blocked colon  . GALLBLADDER SURGERY    . GASTRIC BYPASS  2010  . internal bleeding  2016  . MOUTH SURGERY    . OVARY SURGERY    . TONSILLECTOMY      There were no vitals filed for this visit.      Subjective Assessment - 08/21/16 1719    Subjective Patient reports she is still haivng lower back and right leg pain and preparing for right ankle and foot surgery. She is going to contact Dr. Alleen Borne to be able to continue therapy for back and right LE pain in order to prepare for surgery.    Limitations Sitting;Standing;Walking;House hold activities   Patient Stated Goals Patient wants to walk more normally with less pain   Currently in Pain? Yes   Pain Score 7    Pain Location Leg   Pain Orientation Right   Pain Descriptors / Indicators Aching   Pain Type Chronic pain   Pain Onset More than a month ago  Pain Frequency Intermittent        Objective:  Gait: SPC slow cadence, antalgic gait pattern Palpation: point tender lower lumbar spine bilaterally with radiating symptoms into right LE  Treatment: Modalities: Electrical stimulation applied to lumbar spine high volt for spasm reduction, intensity to tolerance (~ 150 volts) with moist heat to same with patient seated in chair in conjunction with exercises for LE's: 30 minutes: no adverse skin reaction noted  Therapeutic exercise: patient performed exercises with verbal, tactile cues and demonstration of therapist Sitting:  Calf stretch off balance stones with 3# weights on thighs x 30 seconds LAQ with 3# weights on ankles 2 x 15reps Resistive hamstring curls with green band and assistance 2 x 15 reps Hip abduction in sitting with manual resistance of therapist x 15  reps Hip adduction with ball and glute sets x 15 reps  Patient response to treatment: Patient reported decreased pain in back and right hip into LE from 7/10 to 5/10. She demonstrated improved motor control and good technique with all exercises with minimal VC.             PT Education - 08/21/16 1747    Education provided Yes   Education Details HEP: re inforced exercises to contiue in sitting to strengthen LE's   Person(s) Educated Patient   Methods Explanation   Comprehension Verbalized understanding             PT Long Term Goals - 06/27/16 1212      PT LONG TERM GOAL #1   Title Patient will improve LEFS score to 40/80 by 08/22/2016 demonstrating improved function with daily tasks and able to stand, sit and walk with less difficulty   Baseline LEFS 17/80   Status New     PT LONG TERM GOAL #2   Title patient will be indepenent with home program for pain control and progressive exercises for flexibiltiy and strength by discharge to continue with self managment by 08/22/2016   Baseline limited knowledge of appropriate pain control strategies and progression of exercises   Status New               Plan - 08/21/16 1748    Clinical Impression Statement Patient has improved pain in right LE with decreased pain in knee to 0/10 and is progressing with strength in LE. She continues with right ankle and foot pain with limited abiltiy to stand and walk due to ankle. She also contiues with back pain and symptoms with walking most likely due to limitation of right ankle weakness, pain. She will benefit from continued physical therapy intervention for strengthening and progressive exercise and pain control in order to prepare for right ankle/foot surgery.    PT Frequency 2x / week   PT Duration 8 weeks   PT Treatment/Interventions Electrical Stimulation;Moist Heat;Ultrasound;Patient/family education;Neuromuscular re-education;Therapeutic exercise;Manual techniques   PT Next  Visit Plan pain control, ther. ex, manual therapy;    PT Home Exercise Plan stabilization exercises, strengthening as instructed for right LE/ankle      Patient will benefit from skilled therapeutic intervention in order to improve the following deficits and impairments:  Decreased strength, Impaired flexibility, Decreased activity tolerance, Impaired perceived functional ability, Pain, Decreased endurance, Difficulty walking  Visit Diagnosis: Muscle weakness (generalized)  Difficulty in walking, not elsewhere classified  Right knee pain, unspecified chronicity  Other muscle spasm     Problem List Patient Active Problem List   Diagnosis Date Noted  . Breast cancer screening 06/18/2016  .  Knee pain 04/03/2016  . Hyperkalemia 03/01/2016  . Hyponatremia 03/01/2016  . High triglycerides 12/24/2015  . Depression with anxiety 12/24/2015  . Pes planus of both feet 11/16/2015  . Plantar fasciitis of right foot 11/16/2015  . Ingrown toenail 11/16/2015  . Heel spur 11/16/2015  . Diabetic neuropathy (HCC) 11/16/2015  . Right ankle pain 11/16/2015  . Controlled substance agreement signed 11/16/2015  . Medication monitoring encounter 11/16/2015  . Opiate use 11/16/2015  . Diabetes mellitus without complication (HCC) 08/15/2015  . Need for Tdap vaccination 08/15/2015  . Chronic pain of multiple joints 08/15/2015  . Abnormal mammogram of right breast 06/19/2015  . Cerumen impaction 03/16/2015  . Hypertension goal BP (blood pressure) < 140/90 12/14/2014  . Hyperlipidemia LDL goal <100 12/14/2014  . History of transfusion of packed RBC 12/14/2014  . COPD, mild (HCC) 12/14/2014  . Cigarette smoker 12/14/2014  . Status post bariatric surgery 12/14/2014  . Obesity 12/14/2014  . Chronic radicular lumbar pain 12/14/2014  . History of GI bleed 11/12/2014  . GERD without esophagitis 11/12/2014  . History of small bowel obstruction 09/07/2014    Beacher May PT 08/22/2016, 10:21  PM  [facility] PHYSICAL AND SPORTS MEDICINE [address] [place], [address] Phone: [phone]   Fax:  [phone]  Name: Andrea Holden MRN: [id number] Date of Birth: July 02, 1945

## 2016-08-23 ENCOUNTER — Ambulatory Visit: Payer: Medicare PPO | Admitting: Physical Therapy

## 2016-09-03 ENCOUNTER — Encounter: Payer: Self-pay | Admitting: Physical Therapy

## 2016-09-03 ENCOUNTER — Ambulatory Visit: Payer: Medicare PPO | Attending: Orthopedic Surgery | Admitting: Physical Therapy

## 2016-09-03 DIAGNOSIS — M6281 Muscle weakness (generalized): Secondary | ICD-10-CM | POA: Diagnosis present

## 2016-09-03 DIAGNOSIS — M62838 Other muscle spasm: Secondary | ICD-10-CM | POA: Insufficient documentation

## 2016-09-03 DIAGNOSIS — G8929 Other chronic pain: Secondary | ICD-10-CM | POA: Insufficient documentation

## 2016-09-03 DIAGNOSIS — R262 Difficulty in walking, not elsewhere classified: Secondary | ICD-10-CM | POA: Insufficient documentation

## 2016-09-03 DIAGNOSIS — M25571 Pain in right ankle and joints of right foot: Secondary | ICD-10-CM | POA: Insufficient documentation

## 2016-09-03 DIAGNOSIS — M5441 Lumbago with sciatica, right side: Secondary | ICD-10-CM | POA: Insufficient documentation

## 2016-09-05 ENCOUNTER — Encounter: Payer: Self-pay | Admitting: Physical Therapy

## 2016-09-05 ENCOUNTER — Ambulatory Visit: Payer: Medicare PPO | Admitting: Physical Therapy

## 2016-09-05 DIAGNOSIS — M62838 Other muscle spasm: Secondary | ICD-10-CM

## 2016-09-05 DIAGNOSIS — M6281 Muscle weakness (generalized): Secondary | ICD-10-CM

## 2016-09-05 DIAGNOSIS — R262 Difficulty in walking, not elsewhere classified: Secondary | ICD-10-CM

## 2016-09-05 DIAGNOSIS — G8929 Other chronic pain: Secondary | ICD-10-CM

## 2016-09-05 DIAGNOSIS — M5441 Lumbago with sciatica, right side: Secondary | ICD-10-CM

## 2016-09-05 DIAGNOSIS — M25571 Pain in right ankle and joints of right foot: Secondary | ICD-10-CM

## 2016-09-05 NOTE — Therapy (Signed)
[facility] PH[facility] AND [facility] MEDICINE [address] [place], [place], [address] Phone: [phone]   Fax:  [phone]  Physical Therapy Evaluation  Patient Details  Name: Andrea MangleDianna J Holden MRN: [id number] Date of Birth: 1946/04/04 Referring Provider: Alleen BorneAdams, Samuel MD  Encounter Date: 09/03/2016      PT End of Session - 09/03/16 2022    Visit Number 1   Number of Visits 12   Date for PT Re-Evaluation 10/15/16   Authorization Type 1   Authorization Time Period 10 (G code)   PT Start Time 1915   PT Stop Time 2010   PT Time Calculation (min) 55 min   Activity Tolerance Patient tolerated treatment well   Behavior During Therapy [facility] for tasks assessed/performed      Past Medical History:  Diagnosis Date  . Allergy    seasonal allergies  . Anemia   . Anxiety   . Arthritis   . Asthma    allergy induced asthma  . Cataract   . Clotting disorder (HCC)    history of blood clots  . Complication of anesthesia    arrhythmia following colonoscopy  . Degenerative disc disease, lumbar   . Degenerative disc disease, lumbar   . Depression   . Diabetes mellitus   . Diabetic neuropathy (HCC) 11/16/2015  . Dysrhythmia   . GERD (gastroesophageal reflux disease)   . H/O eating disorder   . H/O transfusion    patient was [facility], blood type O+  . History of chicken pox   . History of hiatal hernia   . History of measles, mumps, or rubella   . HOH (hard of hearing)   . Hyperlipidemia   . Hypertension   . Irregular heartbeat   . Moderate COPD (chronic obstructive pulmonary disease) (HCC) 12/14/2014  . Neuropathy (HCC)   . Opiate use 11/16/2015  . Orthopnea   . Pes planus of both feet 11/16/2015  . Plantar fasciitis of right foot 11/16/2015  . Wheezing     Past Surgical History:  Procedure Laterality Date  . ABDOMINAL HYSTERECTOMY    . CATARACT EXTRACTION W/PHACO Right 05/30/2015   Procedure: CATARACT  EXTRACTION PHACO AND INTRAOCULAR LENS PLACEMENT (IOC);  Surgeon: Galen ManilaWilliam Porfilio, MD;  Location: [facility] ORS;  Service: Ophthalmology;  Laterality: Right;  US 00:57 AP% 20.9 CDE 11.99 fluid pack lot #[id number]  . CHOLECYSTECTOMY    . COLON SURGERY     blocked colon  . GALLBLADDER SURGERY    . GASTRIC BYPASS  2010  . internal bleeding  2016  . MOUTH SURGERY    . OVARY SURGERY    . TONSILLECTOMY      There were no vitals filed for this visit.       Subjective Assessment - 09/03/16 1940    Subjective Patient reports she is having back and right LE pain and cramping in left LE as well. She has had her right ankle and foot pain for years and has gotten progressively worse over the years.    Pertinent History chronic back pain and foot pain over many years since 1995 with previous physical therapy with good results.    Limitations Sitting;Standing;Walking;House hold activities   How long can you sit comfortably? <1 hour   How long can you stand comfortably? <20 min   How long can you walk comfortably? only short distances   Patient Stated Goals Patient wants to walk more normally with less  pain in back and is preparing for right foot surgery   Currently in Pain? 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   Multiple Pain Sites Yes   Pain Score 5   Pain Location Foot   Pain Orientation Right   Pain Descriptors / Indicators Aching;Tightness   Pain Type Chronic pain   Pain Onset More than a month ago   Pain Frequency Constant   Aggravating Factors  standing, walking   Pain Relieving Factors rest   Effect of Pain on  Daily Activities standing, walking limited            [facility] PT Assessment - 09/03/16 1942      Assessment   Medical Diagnosis sciatica, right ankle pain/posterior tibialis tendonitis   Referring Provider Alleen Borne MD   Onset Date/Surgical Date 11/30/15   Hand Dominance Right   Prior Therapy none     Precautions   Precautions None     Restrictions   Weight Bearing Restrictions No     Balance Screen   Has the patient fallen in the past 6 months No   Has the patient had a decrease in activity level because of a fear of falling?  No   Is the patient reluctant to leave their home because of a fear of falling?  No     Home Environment   Living Environment Private residence   Living Arrangements Alone   Type of Home House   Home Access Stairs to enter   Entrance Stairs-Number of Steps 3   Entrance Stairs-Rails Right;Left;Can reach both   Home Layout One level     Prior Function   Level of Independence Independent   Vocation Part time employment   Vocation Requirements substitute teaching     Cognition   Overall Cognitive Status Within Functional Limits for tasks assessed     Objective; Posture: increased thoracic kyphosis, moderate forward head posture, rounded shoulders, decrease lumbar lordosis, right foot everted, severe pronation Gait: SPC slow cadence, antalgic gait pattern Palpation: point tender lower lumbar spine bilaterally with radiating symptoms into right LE; pain right foot medial aspect and into right ankle AROM; Lumbar spine; moderate limitations with flexion, extension with pain limiting motion Right LE: hip, knee WNL; ankle no active inversion, foot positioned in eversion Left LE: hip, knee and ankle [facility] Strength testing deferred due to pain level   Outcome measures: Ankle/foot disability index 53% self perceived disability MODI low back pain questionnaire 58% self perceived disabiltiy  Treatment: Modalities: Electrical stimulation applied to  lumbar spine high volt for spasm reduction, intensity to tolerance (~ 130volts) with moist heat to same with patient seated in chair in conjunction with exercises for LE's: 20 minutes: no adverse skin reaction noted  Reviewed sitting, standing postures and use of back roll to support spine with sitting   Patient response to treatment: patient reported decreased spasms and pain by 30% and able to walk with decreased back pain and more erect posture at end of session  Patient education;  POC, discussed outcome measures, goals Patient verbalized  understanding      PT Long Term Goals - 09-11-2016 11-15-38      PT LONG TERM GOAL #1   Title Patient will improve MODI score to 30% or better by 10/15/2016 demonstrating improved function with daily tasks and able to stand, sit and walk with less difficulty   Baseline MODI 58% self perceived impairment (0 = no impairment)   Status New     PT LONG TERM GOAL #2   Title Patient will demonstrate improved foot ankle disablity score to 40% or better indicating improvement with sleep, daily activties involving standing by 10/15/2016   Baseline foot/ankle disabiltiy score 53%   Status New     PT LONG TERM GOAL #3   Title Patient will ben independent with home program for pain control and progressive exercises for flexibility and strength in lumbar spine/right LE by 10/15/2016 in order to transition to home program as she prepares for foot surgery   Baseline limited knowldge of appropriate exercises and progression without cuing, instruction   Status New               Plan - 2016/09/11 Nov 15, 2031    Clinical Impression Statement Patient is a 71 year old female who presents with chronic low back and right LE/foot pain with anticipation of having right foot surgery later this year. She has pain, weakness, limited mobiltiy in right foot and LE's which limits functional ambulation at home and in community. She will benefit from physical therapy intervention to  address pain, weakness in order to improve function as able prior to surgery. Her current impairment level is 55% based on MODI score of 58%, NRS pain scale, and Foot and ankle disability questionnaire score of 53%    Rehab Potential Fair   Clinical Impairments Affecting Rehab Potential (+) motivated (-) muliple co morbidities, chronic condition   PT Frequency 2x / week   PT Duration 6 weeks   PT Treatment/Interventions Electrical Stimulation;Moist Heat;Ultrasound;Patient/family education;Neuromuscular re-education;Therapeutic exercise;Manual techniques   PT Next Visit Plan pain control, ther. ex, manual therapy   PT Home Exercise Plan stabilization exercises, strengthening as instructed for right LE/ankle   Consulted and Agree with Plan of Care Patient      Patient will benefit from skilled therapeutic intervention in order to improve the following deficits and impairments:  Decreased strength, Impaired flexibility, Decreased activity tolerance, Impaired perceived functional ability, Pain, Decreased endurance, Difficulty walking  Visit Diagnosis: Difficulty in walking, not elsewhere classified - Plan: PT plan of care cert/re-cert  Muscle weakness (generalized) - Plan: PT plan of care cert/re-cert  Other muscle spasm - Plan: PT plan of care cert/re-cert  Pain in right ankle and joints of right foot - Plan: PT plan of care cert/re-cert  Chronic bilateral low back pain with right-sided sciatica - Plan: PT plan of care cert/re-cert      G-Codes - 2016/09/11 Nov 15, 2038    Functional Assessment Tool Used (Outpatient Only) MODI, ankle/foot disability index, strength, ROM, pain, clinical judgment   Functional Limitation Mobility: Walking and moving around   Mobility: Walking and Moving Around Current Status (W1191) At least 40 percent but less than 60 percent impaired, limited or restricted   Mobility: Walking and Moving Around Goal Status [id number]) At least 20 percent but less than 40 percent  impaired, limited or restricted       Problem List Patient Active Problem List   Diagnosis Date Noted  . Breast cancer screening 06/18/2016  . Knee pain 04/03/2016  . Hyperkalemia  03/01/2016  . Hyponatremia 03/01/2016  . High triglycerides 12/24/2015  . Depression with anxiety 12/24/2015  . Pes planus of both feet 11/16/2015  . Plantar fasciitis of right foot 11/16/2015  . Ingrown toenail 11/16/2015  . Heel spur 11/16/2015  . Diabetic neuropathy (HCC) 11/16/2015  . Right ankle pain 11/16/2015  . Controlled substance agreement signed 11/16/2015  . Medication monitoring encounter 11/16/2015  . Opiate use 11/16/2015  . Diabetes mellitus without complication (HCC) 08/15/2015  . Need for Tdap vaccination 08/15/2015  . Chronic pain of multiple joints 08/15/2015  . Abnormal mammogram of right breast 06/19/2015  . Cerumen impaction 03/16/2015  . Hypertension goal BP (blood pressure) < 140/90 12/14/2014  . Hyperlipidemia LDL goal <100 12/14/2014  . History of transfusion of packed RBC 12/14/2014  . COPD, mild (HCC) 12/14/2014  . Cigarette smoker 12/14/2014  . Status post bariatric surgery 12/14/2014  . Obesity 12/14/2014  . Chronic radicular lumbar pain 12/14/2014  . History of GI bleed 11/12/2014  . GERD without esophagitis 11/12/2014  . History of small bowel obstruction 09/07/2014    Beacher May PT 09/05/2016, 12:16 AM  [facility] PHYSICAL AND SPORTS MEDICINE [address], [place], [address] Phone: [phone]   Fax:  [phone]  Name: PENELOPI MIKRUT MRN: [id number] Date of Birth: 1945/10/11

## 2016-09-06 NOTE — Therapy (Signed)
[facility] [address] [place], [place], [address] Phone: [phone]   Fax:  [phone]  Physical Therapy Treatment  Patient Details  Name: Andrea MangleDianna J Benton MRN: [id number] Date of Birth: December 27, 1945 Referring Provider: Alleen BorneAdams, Samuel MD  Encounter Date: 09/05/2016      PT End of Session - 09/05/16 1930    Visit Number 2   Number of Visits 12   Date for PT Re-Evaluation 10/15/16   Authorization Type 2   Authorization Time Period 10 (G code)   PT Start Time 1843   PT Stop Time 1930   PT Time Calculation (min) 47 min   Activity Tolerance Patient tolerated treatment well   Behavior During Therapy [facility] for tasks assessed/performed      Past Medical History:  Diagnosis Date  . Allergy    seasonal allergies  . Anemia   . Anxiety   . Arthritis   . Asthma    allergy induced asthma  . Cataract   . Clotting disorder (HCC)    history of blood clots  . Complication of anesthesia    arrhythmia following colonoscopy  . Degenerative disc disease, lumbar   . Degenerative disc disease, lumbar   . Depression   . Diabetes mellitus   . Diabetic neuropathy (HCC) 11/16/2015  . Dysrhythmia   . GERD (gastroesophageal reflux disease)   . H/O eating disorder   . H/O transfusion    patient [facility], blood type O+  . History of chicken pox   . History of hiatal hernia   . History of measles, mumps, or rubella   . HOH (hard of hearing)   . Hyperlipidemia   . Hypertension   . Irregular heartbeat   . Moderate COPD (chronic obstructive pulmonary disease) (HCC) 12/14/2014  . Neuropathy (HCC)   . Opiate use 11/16/2015  . Orthopnea   . Pes planus of both feet 11/16/2015  . Plantar fasciitis of right foot 11/16/2015  . Wheezing     Past Surgical History:  Procedure Laterality Date  . ABDOMINAL HYSTERECTOMY    . CATARACT EXTRACTION W/PHACO Right 05/30/2015   Procedure: CATARACT  EXTRACTION PHACO AND INTRAOCULAR LENS PLACEMENT (IOC);  Surgeon: Galen ManilaWilliam Porfilio, MD;  Location: [facility] ORS;  Service: Ophthalmology;  Laterality: Right;  US 00:57 AP% 20.9 CDE 11.99 fluid pack lot #[id number]  . CHOLECYSTECTOMY    . COLON SURGERY     blocked colon  . GALLBLADDER SURGERY    . GASTRIC BYPASS  2010  . internal bleeding  2016  . MOUTH SURGERY    . OVARY SURGERY    . TONSILLECTOMY      There were no vitals filed for this visit.      Subjective Assessment - 09/05/16 1854    Subjective Patient reports she is feeling pain and stiffness in lower back today and feels the heat and electrical stimulation made a great deal of improvement previous session and would like to have that again.    Pertinent History chronic back pain and foot pain over many years since 1995 with previous physical therapy with good results.    Limitations Sitting;Standing;Walking;House hold activities   How long can you sit comfortably? <1 hour   How long can you stand comfortably? <20 min   How long can you walk comfortably? only short distances   Patient Stated Goals Patient wants to walk more normally with less pain in back  and is preparing for right foot surgery   Currently in Pain? Yes   Pain Score 6    Pain Location Back   Pain Orientation Lower   Pain Descriptors / Indicators Aching   Pain Type Acute pain;Chronic pain   Pain Onset More than a month ago   Pain Frequency Intermittent   Pain Onset More than a month ago        Objective;  Palpation: hypersensitive to palpation bilateral paravertebral muscles lumbar spine Gait: guarded posture on arrival  Treatment: Manual therapy: STM performed with patient seated in massage chair, superficial techniques for decreasing pain, spasms   Modalities: Electrical stimulation: therapist applied (4) electrodes to lumbar spine: high volt for spasm reduction, intensity to tolerance (~ 150volts) with moist heat to same with patient seated In  massage chair x 20 minutes: no adverse skin reaction noted     Patient response to treatment: Patient with decreased pain/hypersensitivity by 50%: improved posture and gait with improved posture and cadence at end of session.         PT Education - 09/05/16 1933    Education provided Yes   Education Details HEP: posture awareness with sitting, standing, perform exercises as instructed, use of heat for pain control   Person(s) Educated Patient   Methods Explanation   Comprehension Verbalized understanding             PT Long Term Goals - 09/03/16 2040      PT LONG TERM GOAL #1   Title Patient will improve MODI score to 30% or better by 10/15/2016 demonstrating improved function with daily tasks and able to stand, sit and walk with less difficulty   Baseline MODI 58% self perceived impairment (0 = no impairment)   Status New     PT LONG TERM GOAL #2   Title Patient will demonstrate improved foot ankle disablity score to 40% or better indicating improvement with sleep, daily activties involving standing by 10/15/2016   Baseline foot/ankle disabiltiy score 53%   Status New     PT LONG TERM GOAL #3   Title Patient will ben independent with home program for pain control and progressive exercises for flexibility and strength in lumbar spine/right LE by 10/15/2016 in order to transition to home program as she prepares for foot surgery   Baseline limited knowldge of appropriate exercises and progression without cuing, instruction   Status New               Plan - 09/05/16 2000    Clinical Impression Statement Patient demonstrated decreased pain and improved soft tissue elasticity and decreased spasms following treatment. She continues with lower back pain and right foot pain that limits walking and daily tasks and will benefit from continued physical therapy intervention.    Rehab Potential Fair   Clinical Impairments Affecting Rehab Potential (+) motivated (-) muliple co  morbidities, chronic condition   PT Frequency 2x / week   PT Duration 6 weeks   PT Treatment/Interventions Electrical Stimulation;Moist Heat;Ultrasound;Patient/family education;Neuromuscular re-education;Therapeutic exercise;Manual techniques   PT Next Visit Plan pain control, ther. ex, manual therapy to right foot, lower back as indicated   PT Home Exercise Plan stabilization exercises, strengthening as instructed for right LE/ankle      Patient will benefit from skilled therapeutic intervention in order to improve the following deficits and impairments:  Decreased strength, Impaired flexibility, Decreased activity tolerance, Impaired perceived functional ability, Pain, Decreased endurance, Difficulty walking  Visit Diagnosis: Difficulty in walking, not  elsewhere classified  Muscle weakness (generalized)  Other muscle spasm  Pain in right ankle and joints of right foot  Chronic bilateral low back pain with right-sided sciatica     Problem List Patient Active Problem List   Diagnosis Date Noted  . Breast cancer screening 06/18/2016  . Knee pain 04/03/2016  . Hyperkalemia 03/01/2016  . Hyponatremia 03/01/2016  . High triglycerides 12/24/2015  . Depression with anxiety 12/24/2015  . Pes planus of both feet 11/16/2015  . Plantar fasciitis of right foot 11/16/2015  . Ingrown toenail 11/16/2015  . Heel spur 11/16/2015  . Diabetic neuropathy (HCC) 11/16/2015  . Right ankle pain 11/16/2015  . Controlled substance agreement signed 11/16/2015  . Medication monitoring encounter 11/16/2015  . Opiate use 11/16/2015  . Diabetes mellitus without complication (HCC) 08/15/2015  . Need for Tdap vaccination 08/15/2015  . Chronic pain of multiple joints 08/15/2015  . Abnormal mammogram of right breast 06/19/2015  . Cerumen impaction 03/16/2015  . Hypertension goal BP (blood pressure) < 140/90 12/14/2014  . Hyperlipidemia LDL goal <100 12/14/2014  . History of transfusion of packed RBC  12/14/2014  . COPD, mild (HCC) 12/14/2014  . Cigarette smoker 12/14/2014  . Status post bariatric surgery 12/14/2014  . Obesity 12/14/2014  . Chronic radicular lumbar pain 12/14/2014  . History of GI bleed 11/12/2014  . GERD without esophagitis 11/12/2014  . History of small bowel obstruction 09/07/2014    Beacher May PT 09/06/2016, 9:52 PM  [facility] PHYSICAL AND SPORTS MEDICINE [address], [place], [address] Phone: [phone]   Fax:  [phone]  Name: ROSENE PILLING MRN: [id number] Date of Birth: 1945/12/16

## 2016-09-11 ENCOUNTER — Ambulatory Visit: Payer: Medicare PPO | Admitting: Physical Therapy

## 2016-09-11 ENCOUNTER — Encounter: Payer: Self-pay | Admitting: Physical Therapy

## 2016-09-11 DIAGNOSIS — M5441 Lumbago with sciatica, right side: Secondary | ICD-10-CM

## 2016-09-11 DIAGNOSIS — M6281 Muscle weakness (generalized): Secondary | ICD-10-CM

## 2016-09-11 DIAGNOSIS — M25571 Pain in right ankle and joints of right foot: Secondary | ICD-10-CM

## 2016-09-11 DIAGNOSIS — R262 Difficulty in walking, not elsewhere classified: Secondary | ICD-10-CM | POA: Diagnosis not present

## 2016-09-11 DIAGNOSIS — M62838 Other muscle spasm: Secondary | ICD-10-CM

## 2016-09-11 DIAGNOSIS — G8929 Other chronic pain: Secondary | ICD-10-CM

## 2016-09-11 NOTE — Therapy (Signed)
[facility] PHYSICAL AND SPORTS MEDICINE [address] [place], [place], [address] Phone: [phone]   Fax:  [phone]  Physical Therapy Treatment  Patient Details  Name: Andrea Holden MRN: [id number] Date of Birth: [birth year] Referring Provider: Alleen BorneAdams, Samuel MD  Encounter Date: 09/11/2016      PT End of Session - 09/11/16 1838    Visit Number 3   Number of Visits 12   Date for PT Re-Evaluation 10/15/16   Authorization Type 3   Authorization Time Period 10 (G code)   PT Start Time 1833   PT Stop Time 1922   PT Time Calculation (min) 49 min   Activity Tolerance Patient tolerated treatment well   Behavior During Therapy [facility] for tasks assessed/performed      Past Medical History:  Diagnosis Date  . Allergy    seasonal allergies  . Anemia   . Anxiety   . Arthritis   . Asthma    allergy induced asthma  . Cataract   . Clotting disorder (HCC)    history of blood clots  . Complication of anesthesia    arrhythmia following colonoscopy  . Degenerative disc disease, lumbar   . Degenerative disc disease, lumbar   . Depression   . Diabetes mellitus   . Diabetic neuropathy (HCC) 11/16/2015  . Dysrhythmia   . GERD (gastroesophageal reflux disease)   . H/O eating disorder   . H/O transfusion    [facility]  . History of chicken pox   . History of hiatal hernia   . History of measles, mumps, or rubella   . HOH (hard of hearing)   . Hyperlipidemia   . Hypertension   . Irregular heartbeat   . Moderate COPD (chronic obstructive pulmonary disease) (HCC) 12/14/2014  . Neuropathy (HCC)   . Opiate use 11/16/2015  . Orthopnea   . Pes planus of both feet 11/16/2015  . Plantar fasciitis of right foot 11/16/2015  . Wheezing     Past Surgical History:  Procedure Laterality Date  . ABDOMINAL HYSTERECTOMY    . CATARACT EXTRACTION W/PHACO Right 05/30/2015   Procedure: CATARACT  EXTRACTION PHACO AND INTRAOCULAR LENS PLACEMENT (IOC);  Surgeon: Galen ManilaWilliam Porfilio, MD;  Location: [facility] ORS;  Service: Ophthalmology;  Laterality: Right;  US 00:57 AP% 20.9 CDE 11.99 fluid pack lot #[id number]  . CHOLECYSTECTOMY    . COLON SURGERY     blocked colon  . GALLBLADDER SURGERY    . GASTRIC BYPASS  2010  . internal bleeding  2016  . MOUTH SURGERY    . OVARY SURGERY    . TONSILLECTOMY      There were no vitals filed for this visit.      Subjective Assessment - 09/11/16 1835    Subjective Patient reports she is having pain in her back today and is increased.    Pertinent History chronic back pain and foot pain over many years since 1995 with previous physical therapy with good results.    Limitations Sitting;Standing;Walking;House hold activities   How long can you sit comfortably? <1 hour   How long can you stand comfortably? <20 min   How long can you walk comfortably? only short distances   Patient Stated Goals Patient wants to walk more normally with less pain in back and is preparing for right foot surgery   Currently in Pain? Yes   Pain Score 6  Pain Location Back   Pain Orientation Lower   Pain Descriptors / Indicators Aching   Pain Type Acute pain;Chronic pain   Pain Onset More than a month ago   Pain Frequency Intermittent   Pain Score 5   Pain Location Foot   Pain Orientation Right   Pain Descriptors / Indicators Aching   Pain Type Chronic pain   Pain Onset More than a month ago   Pain Frequency Constant      Objective;  Palpation: hypersensitive to palpation bilateral paravertebral muscles lumbar spine and right foot/ankle plantar aspect and lateral aspect of ankle Gait: guarded posture on arrival, forward flexed trunk, using SPC for balance  Treatment: Manual therapy: STM performed with patient seated in massage chair, superficial techniques for decreasing pain, spasms   Modalities: Electrical stimulation: therapist applied (4) electrodes to  lumbar spine and (2) electrodes applied to right foot/ankle: high volt for spasm reduction, intensity to tolerance (~ 150volts) with moist heat to same with patient seated In massage chair x : no adverse skin reaction noted    Patient response to treatment: Patient reported decreased pain in back and right ankle by 50% with improved soft tissue elasticity in lower back bilateral gluteus medius muscles by >50% and continued with increased hypersensitivity.          PT Education - 09/11/16 1923    Education provided Yes   Education Details educated in anatomy of ankle and tendons prior to treatment   Person(s) Educated Patient   Methods Explanation   Comprehension Verbalized understanding             PT Long Term Goals - 09/03/16 2040      PT LONG TERM GOAL #1   Title Patient will improve MODI score to 30% or better by 10/15/2016 demonstrating improved function with daily tasks and able to stand, sit and walk with less difficulty   Baseline MODI 58% self perceived impairment (0 = no impairment)   Status New     PT LONG TERM GOAL #2   Title Patient will demonstrate improved foot ankle disablity score to 40% or better indicating improvement with sleep, daily activties involving standing by 10/15/2016   Baseline foot/ankle disabiltiy score 53%   Status New     PT LONG TERM GOAL #3   Title Patient will ben independent with home program for pain control and progressive exercises for flexibility and strength in lumbar spine/right LE by 10/15/2016 in order to transition to home program as she prepares for foot surgery   Baseline limited knowldge of appropriate exercises and progression without cuing, instruction   Status New               Plan - 09/11/16 1839    Clinical Impression Statement Patient demonstrated decreased pain and improved soft tissue elasticity and decreased spasms following treatment. She continues with primary limiting factors of pain, spasms in  lumbar spine and right ankle which limit functional ambulation without AD and for daily work and community tasks.    Rehab Potential Fair   Clinical Impairments Affecting Rehab Potential (+) motivated (-) muliple co morbidities, chronic condition   PT Frequency 2x / week   PT Duration 6 weeks   PT Treatment/Interventions Electrical Stimulation;Moist Heat;Ultrasound;Patient/family education;Neuromuscular re-education;Therapeutic exercise;Manual techniques   PT Next Visit Plan pain control, ther. ex, manual therapy   PT Home Exercise Plan stabilization exercises, strengthening as instructed for right LE/ankle      Patient will benefit from skilled  therapeutic intervention in order to improve the following deficits and impairments:  Decreased strength, Impaired flexibility, Decreased activity tolerance, Impaired perceived functional ability, Pain, Decreased endurance, Difficulty walking  Visit Diagnosis: Difficulty in walking, not elsewhere classified  Muscle weakness (generalized)  Other muscle spasm  Pain in right ankle and joints of right foot  Chronic bilateral low back pain with right-sided sciatica     Problem List Patient Active Problem List   Diagnosis Date Noted  . Breast cancer screening 06/18/2016  . Knee pain 04/03/2016  . Hyperkalemia 03/01/2016  . Hyponatremia 03/01/2016  . High triglycerides 12/24/2015  . Depression with anxiety 12/24/2015  . Pes planus of both feet 11/16/2015  . Plantar fasciitis of right foot 11/16/2015  . Ingrown toenail 11/16/2015  . Heel spur 11/16/2015  . Diabetic neuropathy (HCC) 11/16/2015  . Right ankle pain 11/16/2015  . Controlled substance agreement signed 11/16/2015  . Medication monitoring encounter 11/16/2015  . Opiate use 11/16/2015  . Diabetes mellitus without complication (HCC) 08/15/2015  . Need for Tdap vaccination 08/15/2015  . Chronic pain of multiple joints 08/15/2015  . Abnormal mammogram of right breast 06/19/2015   . Cerumen impaction 03/16/2015  . Hypertension goal BP (blood pressure) < 140/90 12/14/2014  . Hyperlipidemia LDL goal <100 12/14/2014  . History of transfusion of packed RBC 12/14/2014  . COPD, mild (HCC) 12/14/2014  . Cigarette smoker 12/14/2014  . Status post bariatric surgery 12/14/2014  . Obesity 12/14/2014  . Chronic radicular lumbar pain 12/14/2014  . History of GI bleed 11/12/2014  . GERD without esophagitis 11/12/2014  . History of small bowel obstruction 09/07/2014    Beacher May PT 09/12/2016, 8:13 PM  [facility] PHYSICAL AND SPORTS MEDICINE [address] [place], [address] Phone: [phone]   Fax:  [phone]  Name: Andrea Holden MRN: [id number] Date of Birth: 11/19/1945

## 2016-09-12 ENCOUNTER — Encounter: Payer: Self-pay | Admitting: Physical Therapy

## 2016-09-12 ENCOUNTER — Ambulatory Visit: Payer: Medicare PPO | Admitting: Physical Therapy

## 2016-09-12 DIAGNOSIS — G8929 Other chronic pain: Secondary | ICD-10-CM

## 2016-09-12 DIAGNOSIS — M62838 Other muscle spasm: Secondary | ICD-10-CM

## 2016-09-12 DIAGNOSIS — M5441 Lumbago with sciatica, right side: Secondary | ICD-10-CM

## 2016-09-12 DIAGNOSIS — R262 Difficulty in walking, not elsewhere classified: Secondary | ICD-10-CM | POA: Diagnosis not present

## 2016-09-12 DIAGNOSIS — M6281 Muscle weakness (generalized): Secondary | ICD-10-CM

## 2016-09-12 DIAGNOSIS — M25571 Pain in right ankle and joints of right foot: Secondary | ICD-10-CM

## 2016-09-12 NOTE — Therapy (Signed)
[facility] PHYS[facility] SPORTS MEDICINE [address] [place], [place], [address] Phone: [phone]   Fax:  [phone]  Physical Therapy Treatment  Patient Details  Name: Andrea MangleDianna J Holden MRN: [id number] Date of Birth: 02-05-1946 Referring Provider: Alleen BorneAdams, Samuel MD  Encounter Date: 09/12/2016      PT End of Session - 09/12/16 2023    Visit Number 4   Number of Visits 12   Date for PT Re-Evaluation 10/15/16   Authorization Type 4   Authorization Time Period 10 (G code)   PT Start Time 1850   PT Stop Time 1925   PT Time Calculation (min) 35 min   Activity Tolerance Patient tolerated treatment well   Behavior During Therapy [facility] for tasks assessed/performed      Past Medical History:  Diagnosis Date  . Allergy    seasonal allergies  . Anemia   . Anxiety   . Arthritis   . Asthma    allergy induced asthma  . Cataract   . Clotting disorder (HCC)    history of blood clots  . Complication of anesthesia    arrhythmia following colonoscopy  . Degenerative disc disease, lumbar   . Degenerative disc disease, lumbar   . Depression   . Diabetes mellitus   . Diabetic neuropathy (HCC) 11/16/2015  . Dysrhythmia   . GERD (gastroesophageal reflux disease)   . H/O eating disorder   . H/O transfusion    [facility], [facility]+  . History of chicken pox   . History of hiatal hernia   . History of measles, mumps, or rubella   . HOH (hard of hearing)   . Hyperlipidemia   . Hypertension   . Irregular heartbeat   . Moderate COPD (chronic obstructive pulmonary disease) (HCC) 12/14/2014  . Neuropathy (HCC)   . Opiate use 11/16/2015  . Orthopnea   . Pes planus of both feet 11/16/2015  . Plantar fasciitis of right foot 11/16/2015  . Wheezing     Past Surgical History:  Procedure Laterality Date  . ABDOMINAL HYSTERECTOMY    . CATARACT EXTRACTION W/PHACO Right 05/30/2015   Procedure: CATARACT  EXTRACTION PHACO AND INTRAOCULAR LENS PLACEMENT (IOC);  Surgeon: Galen ManilaWilliam Porfilio, MD;  Location: [facility] ORS;  Service: Ophthalmology;  Laterality: Right;  US 00:57 AP% 20.9 CDE 11.99 fluid pack lot #[id number]  . CHOLECYSTECTOMY    . COLON SURGERY     blocked colon  . GALLBLADDER SURGERY    . GASTRIC BYPASS  2010  . internal bleeding  2016  . MOUTH SURGERY    . OVARY SURGERY    . TONSILLECTOMY      There were no vitals filed for this visit.      Subjective Assessment - 09/12/16 1855    Subjective Patient reports good results with decreased pain in back and right foot and ankle following previous session. She is hurting more on right lower back and hip today.    Pertinent History chronic back pain and foot pain over many years since 1995 with previous physical therapy with good results.    Limitations Sitting;Standing;Walking;House hold activities   How long can you sit comfortably? <1 hour   How long can you stand comfortably? <20 min   How long can you walk comfortably? only short distances   Patient Stated Goals Patient wants to walk more normally with less pain in back and is preparing for right foot  surgery   Currently in Pain? Yes   Pain Score 7    Pain Location Back   Pain Orientation Right;Lower   Pain Descriptors / Indicators Aching;Tender;Sore   Pain Type Acute pain;Chronic pain   Pain Onset More than a month ago   Pain Frequency Intermittent        Objective;  Palpation: hypersensitive to palpation bilateral paravertebral muscles lumbar spine bilaterally  Gait: ambulating with SPC with antalgic gait pattern, decreased trunk rotation, guarded posture   Treatment: Manual therapy: STM performed with patient seated in massage chair to lumbar spine, concentration on right lower back/gluteus medius region, superficial techniques for decreasing pain, spasms   Modalities: Electrical stimulation: therapist applied (4) electrodes to lumbar spine bilaterally: high volt  for spasm reduction, intensity to tolerance (~ 165volts) with moist heat to same with patient seated In massage chair x : no adverse skin reaction noted     Patient response to treatment: Patient reported decreased pain to mild in lower back and right hip region and able to transfer off chair and walk with significantly decreased pain. She verbalized good understanding of posture awareness with sitting and to perform exercises as tolerated daily.           PT Education - 09/12/16 2022    Education provided Yes   Education Details HEP; continue with stabilization exercise for trunk, LE's in sitting, work on posture with sitting, ROM right ankle   Person(s) Educated Patient   Methods Explanation   Comprehension Verbalized understanding             PT Long Term Goals - 09/03/16 2040      PT LONG TERM GOAL #1   Title Patient will improve MODI score to 30% or better by 10/15/2016 demonstrating improved function with daily tasks and able to stand, sit and walk with less difficulty   Baseline MODI 58% self perceived impairment (0 = no impairment)   Status New     PT LONG TERM GOAL #2   Title Patient will demonstrate improved foot ankle disablity score to 40% or better indicating improvement with sleep, daily activties involving standing by 10/15/2016   Baseline foot/ankle disabiltiy score 53%   Status New     PT LONG TERM GOAL #3   Title Patient will ben independent with home program for pain control and progressive exercises for flexibility and strength in lumbar spine/right LE by 10/15/2016 in order to transition to home program as she prepares for foot surgery   Baseline limited knowldge of appropriate exercises and progression without cuing, instruction   Status New               Plan - 09/12/16 2024    Clinical Impression Statement Patient demonstrated good results with decreased spasms and pain following treatment.    Rehab Potential Fair   Clinical  Impairments Affecting Rehab Potential (+) motivated (-) muliple co morbidities, chronic condition   PT Frequency 2x / week   PT Duration 6 weeks   PT Treatment/Interventions Electrical Stimulation;Moist Heat;Ultrasound;Patient/family education;Neuromuscular re-education;Therapeutic exercise;Manual techniques   PT Next Visit Plan pain control, ther. ex, manual therapy   PT Home Exercise Plan stabilization exercises, strengthening as instructed for right LE/ankle      Patient will benefit from skilled therapeutic intervention in order to improve the following deficits and impairments:  Decreased strength, Impaired flexibility, Decreased activity tolerance, Impaired perceived functional ability, Pain, Decreased endurance, Difficulty walking  Visit Diagnosis: Difficulty in walking, not elsewhere classified  Muscle weakness (generalized)  Other muscle spasm  Pain in right ankle and joints of right foot  Chronic bilateral low back pain with right-sided sciatica     Problem List Patient Active Problem List   Diagnosis Date Noted  . Breast cancer screening 06/18/2016  . Knee pain 04/03/2016  . Hyperkalemia 03/01/2016  . Hyponatremia 03/01/2016  . High triglycerides 12/24/2015  . Depression with anxiety 12/24/2015  . Pes planus of both feet 11/16/2015  . Plantar fasciitis of right foot 11/16/2015  . Ingrown toenail 11/16/2015  . Heel spur 11/16/2015  . Diabetic neuropathy (HCC) 11/16/2015  . Right ankle pain 11/16/2015  . Controlled substance agreement signed 11/16/2015  . Medication monitoring encounter 11/16/2015  . Opiate use 11/16/2015  . Diabetes mellitus without complication (HCC) 08/15/2015  . Need for Tdap vaccination 08/15/2015  . Chronic pain of multiple joints 08/15/2015  . Abnormal mammogram of right breast 06/19/2015  . Cerumen impaction 03/16/2015  . Hypertension goal BP (blood pressure) < 140/90 12/14/2014  . Hyperlipidemia LDL goal <100 12/14/2014  . History  of transfusion of packed RBC 12/14/2014  . COPD, mild (HCC) 12/14/2014  . Cigarette smoker 12/14/2014  . Status post bariatric surgery 12/14/2014  . Obesity 12/14/2014  . Chronic radicular lumbar pain 12/14/2014  . History of GI bleed 11/12/2014  . GERD without esophagitis 11/12/2014  . History of small bowel obstruction 09/07/2014    Beacher May PT 09/12/2016, 8:26 PM  [facility] PHYSICAL AND SPORTS MEDICINE [address], [place], [address] Phone: [phone]   Fax:  [phone]  Name: Andrea Holden MRN: [id number] Date of Birth: 21-Nov-1945

## 2016-09-16 ENCOUNTER — Telehealth: Payer: Self-pay

## 2016-09-16 ENCOUNTER — Encounter: Payer: Self-pay | Admitting: Physical Therapy

## 2016-09-16 ENCOUNTER — Ambulatory Visit: Payer: Medicare PPO | Admitting: Physical Therapy

## 2016-09-16 DIAGNOSIS — R262 Difficulty in walking, not elsewhere classified: Secondary | ICD-10-CM | POA: Diagnosis not present

## 2016-09-16 DIAGNOSIS — M25571 Pain in right ankle and joints of right foot: Secondary | ICD-10-CM

## 2016-09-16 DIAGNOSIS — G8929 Other chronic pain: Secondary | ICD-10-CM

## 2016-09-16 DIAGNOSIS — M62838 Other muscle spasm: Secondary | ICD-10-CM

## 2016-09-16 DIAGNOSIS — M6281 Muscle weakness (generalized): Secondary | ICD-10-CM

## 2016-09-16 DIAGNOSIS — M5441 Lumbago with sciatica, right side: Secondary | ICD-10-CM

## 2016-09-16 NOTE — Therapy (Signed)
[place] [facility] [address] [place], [address] Phone: [phone]   Fax:  [phone]  Physical Therapy Treatment  Patient Details  Name: Andrea Holden MRN: [id number] Date of Birth: October 03, 1945 Referring Provider: Alleen Borne MD  Encounter Date: 09/16/2016      PT End of Session - 09/16/16 1730    Visit Number 5   Number of Visits 12   Date for PT Re-Evaluation 10/15/16   Authorization Type 5   Authorization Time Period 10 (G code)   PT Start Time 1618   PT Stop Time 1705   PT Time Calculation (min) 47 min   Activity Tolerance Patient tolerated treatment well   Behavior During Therapy [facility] for tasks assessed/performed      Past Medical History:  Diagnosis Date  . Allergy    seasonal allergies  . Anemia   . Anxiety   . Arthritis   . Asthma    allergy induced asthma  . Cataract   . Clotting disorder (HCC)    history of blood clots  . Complication of anesthesia    arrhythmia following colonoscopy  . Degenerative disc disease, lumbar   . Degenerative disc disease, lumbar   . Depression   . Diabetes mellitus   . Diabetic neuropathy (HCC) 11/16/2015  . Dysrhythmia   . GERD (gastroesophageal reflux disease)   . H/O eating disorder   . H/O transfusion    patient was given 5 units of blood while at [facility], blood type O+  . History of chicken pox   . History of hiatal hernia   . History of measles, mumps, or rubella   . HOH (hard of hearing)   . Hyperlipidemia   . Hypertension   . Irregular heartbeat   . Moderate COPD (chronic obstructive pulmonary disease) (HCC) 12/14/2014  . Neuropathy (HCC)   . Opiate use 11/16/2015  . Orthopnea   . Pes planus of both feet 11/16/2015  . Plantar fasciitis of right foot 11/16/2015  . Wheezing     Past Surgical History:  Procedure Laterality Date  . ABDOMINAL HYSTERECTOMY    . CATARACT EXTRACTION W/PHACO Right 05/30/2015   Procedure: CATARACT  EXTRACTION PHACO AND INTRAOCULAR LENS PLACEMENT (IOC);  Surgeon: Galen Manila, MD;  Location: [facility] ORS;  Service: Ophthalmology;  Laterality: Right;  [place] 00:57 AP% 20.9 CDE 11.99 fluid pack lot #[id number]  . CHOLECYSTECTOMY    . COLON SURGERY     blocked colon  . GALLBLADDER SURGERY    . GASTRIC BYPASS  2010  . internal bleeding  2016  . MOUTH SURGERY    . OVARY SURGERY    . TONSILLECTOMY      There were no vitals filed for this visit.      Subjective Assessment - 09/16/16 1624    Subjective Patient reports good results with decreased pain in back and right foot and ankle following previous session. She is hurting more on right lower back and hip today.    Pertinent History chronic back pain and foot pain over many years since 1995 with previous physical therapy with good results.    Limitations Sitting;Standing;Walking;House hold activities   How long can you sit comfortably? <1 hour   How long can you stand comfortably? <20 min   How long can you walk comfortably? only short distances   Patient Stated Goals Patient wants to walk more normally with less pain in back and is preparing for right foot  surgery   Currently in Pain? Yes   Pain Score 7    Pain Location Back   Pain Orientation Right;Lower   Pain Descriptors / Indicators Aching;Spasm;Tender;Sore   Pain Type Chronic pain   Pain Onset More than a month ago   Pain Frequency Intermittent       Objective;  Palpation: hypersensitive to palpation bilateral paravertebral muscles lumbar spine bilaterally  Gait: ambulating with SPC with antalgic gait pattern, decreased trunk rotation, guarded posture  Treatment: Manual therapy: STM performed with patient seated in massage chair to lumbar spine, concentration on right lower back/gluteus medius region, superficial techniques for decreasing pain, spasms   Modalities: Electrical stimulation: therapistapplied (4) electrodes tolumbar spine bilaterally (2) electrodes  applied to right foot plantar aspect and lateral aspect of ankle: high volt for spasm reduction, intensity to tolerance with ice pack appllied to same with patient seated In massagechair x : goal; pain, spasms; no adverse skin reaction noted   Patient response to treatment: Patient reported decreased pain to mild in lower back and right side paravertebral muscles. patient reported decreased pain in right foot following estim. And ice pack. Patient able to ambulate with less difficulty and pain in back and right foot at end of session.  Patient education:  Re assessed home exercise program for stabilization, posture with patient verbalizing understanding         PT Long Term Goals - 09/03/16 2040      PT LONG TERM GOAL #1   Title Patient will improve MODI score to 30% or better by 10/15/2016 demonstrating improved function with daily tasks and able to stand, sit and walk with less difficulty   Baseline MODI 58% self perceived impairment (0 = no impairment)   Status New     PT LONG TERM GOAL #2   Title Patient will demonstrate improved foot ankle disablity score to 40% or better indicating improvement with sleep, daily activties involving standing by 10/15/2016   Baseline foot/ankle disabiltiy score 53%   Status New     PT LONG TERM GOAL #3   Title Patient will ben independent with home program for pain control and progressive exercises for flexibility and strength in lumbar spine/right LE by 10/15/2016 in order to transition to home program as she prepares for foot surgery   Baseline limited knowldge of appropriate exercises and progression without cuing, instruction   Status New               Plan - 09/16/16 1657    Clinical Impression Statement Patient demonstrates fair carry over between sessions with decreasing spasm in lower back. She continues with tenderness in lower back and right foot that limits ability to walk without difficulty. .    Rehab Potential Fair    Clinical Impairments Affecting Rehab Potential (+) motivated (-) muliple co morbidities, chronic condition   PT Frequency 2x / week   PT Duration 6 weeks   PT Treatment/Interventions Electrical Stimulation;Moist Heat;Ultrasound;Patient/family education;Neuromuscular re-education;Therapeutic exercise;Manual techniques   PT Next Visit Plan pain control, ther. ex, manual therapy   PT Home Exercise Plan stabilization exercises, strengthening as instructed for right LE/ankle      Patient will benefit from skilled therapeutic intervention in order to improve the following deficits and impairments:  Decreased strength, Impaired flexibility, Decreased activity tolerance, Impaired perceived functional ability, Pain, Decreased endurance, Difficulty walking  Visit Diagnosis: Difficulty in walking, not elsewhere classified  Muscle weakness (generalized)  Other muscle spasm  Pain in right ankle and joints  of right foot  Chronic bilateral low back pain with right-sided sciatica     Problem List Patient Active Problem List   Diagnosis Date Noted  . Breast cancer screening 06/18/2016  . Knee pain 04/03/2016  . Hyperkalemia 03/01/2016  . Hyponatremia 03/01/2016  . High triglycerides 12/24/2015  . Depression with anxiety 12/24/2015  . Pes planus of both feet 11/16/2015  . Plantar fasciitis of right foot 11/16/2015  . Ingrown toenail 11/16/2015  . Heel spur 11/16/2015  . Diabetic neuropathy (HCC) 11/16/2015  . Right ankle pain 11/16/2015  . Controlled substance agreement signed 11/16/2015  . Medication monitoring encounter 11/16/2015  . Opiate use 11/16/2015  . Diabetes mellitus without complication (HCC) 08/15/2015  . Need for Tdap vaccination 08/15/2015  . Chronic pain of multiple joints 08/15/2015  . Abnormal mammogram of right breast 06/19/2015  . Cerumen impaction 03/16/2015  . Hypertension goal BP (blood pressure) < 140/90 12/14/2014  . Hyperlipidemia LDL goal <100 12/14/2014  .  History of transfusion of packed RBC 12/14/2014  . COPD, mild (HCC) 12/14/2014  . Cigarette smoker 12/14/2014  . Status post bariatric surgery 12/14/2014  . Obesity 12/14/2014  . Chronic radicular lumbar pain 12/14/2014  . History of GI bleed 11/12/2014  . GERD without esophagitis 11/12/2014  . History of small bowel obstruction 09/07/2014    Beacher MayBrooks, Marie PT 09/17/2016, 9:10 PM  [place] [facility] [address] [place], [place], [address] Phone: [phone]   Fax:  [phone]  Name: Juliette MangleDianna J Capers MRN: [id number] Date of Birth: 1946/02/05

## 2016-09-16 NOTE — Telephone Encounter (Signed)
Pt states she she was [facility] Juanell FairlyKrasinski, Kevin MD in December. Her therapy has help her a lot and she was elevated  [name] and she is scheduled for surgery in June for ankle and foot reconstruction. She wasn't for sure you were able to see the notes but I informed her you should be able to since they are part of [facility].  She also states she quit smoking and it will be 8 weeks in June smoke free.

## 2016-09-16 NOTE — Telephone Encounter (Signed)
Pt asked is it ok to get labs done before her appointment on April 6th 2018. Spoke with Dr. lada and she state she prefer to talk to her before ordering labs. Called pt on cell and left a detail messaged.

## 2016-09-18 ENCOUNTER — Encounter: Payer: Self-pay | Admitting: Physical Therapy

## 2016-09-18 ENCOUNTER — Ambulatory Visit: Payer: Medicare PPO | Admitting: Physical Therapy

## 2016-09-18 DIAGNOSIS — M62838 Other muscle spasm: Secondary | ICD-10-CM

## 2016-09-18 DIAGNOSIS — G8929 Other chronic pain: Secondary | ICD-10-CM

## 2016-09-18 DIAGNOSIS — R262 Difficulty in walking, not elsewhere classified: Secondary | ICD-10-CM | POA: Diagnosis not present

## 2016-09-18 DIAGNOSIS — M6281 Muscle weakness (generalized): Secondary | ICD-10-CM

## 2016-09-18 DIAGNOSIS — M5441 Lumbago with sciatica, right side: Secondary | ICD-10-CM

## 2016-09-18 DIAGNOSIS — M25571 Pain in right ankle and joints of right foot: Secondary | ICD-10-CM

## 2016-09-18 NOTE — Therapy (Signed)
[place] [facility]TS [facility]IC[facility] [address] [place], [address] Phone: [phone]   Fax:  [phone]  Physical Therapy Treatment  Patient Details  Name: Andrea Holden MRN: [id number] Date of Birth: 21-Apr-1946 Referring Provider: Alleen Borne MD  Encounter Date: 09/18/2016      PT End of Session - 09/18/16 1952    Visit Number 6   Number of Visits 12   Date for PT Re-Evaluation 10/15/16   Authorization Type 6   Authorization Time Period 10 (G code)   PT Start Time 1835   PT Stop Time 1940   PT Time Calculation (min) 65 min   Activity Tolerance Patient tolerated treatment well;Patient limited by pain   Behavior During Therapy [facility] for tasks assessed/performed      Past Medical History:  Diagnosis Date  . Allergy    seasonal allergies  . Anemia   . Anxiety   . Arthritis   . Asthma    allergy induced asthma  . Cataract   . Clotting disorder (HCC)    history of blood clots  . Complication of anesthesia    arrhythmia following colonoscopy  . Degenerative disc disease, lumbar   . Degenerative disc disease, lumbar   . Depression   . Diabetes mellitus   . Diabetic neuropathy (HCC) 11/16/2015  . Dysrhythmia   . GERD (gastroesophageal reflux disease)   . H/O eating disorder   . H/O transfusion    patient was given 5 units of blood while at [facility], blood type O+  . History of chicken pox   . History of hiatal hernia   . History of measles, mumps, or rubella   . HOH (hard of hearing)   . Hyperlipidemia   . Hypertension   . Irregular heartbeat   . Moderate COPD (chronic obstructive pulmonary disease) (HCC) 12/14/2014  . Neuropathy (HCC)   . Opiate use 11/16/2015  . Orthopnea   . Pes planus of both feet 11/16/2015  . Plantar fasciitis of right foot 11/16/2015  . Wheezing     Past Surgical History:  Procedure Laterality Date  . ABDOMINAL HYSTERECTOMY    . CATARACT EXTRACTION W/PHACO Right 05/30/2015   Procedure: CATARACT EXTRACTION PHACO AND INTRAOCULAR LENS PLACEMENT (IOC);  Surgeon: Galen Manila, MD;  Location: [facility] ORS;  Service: Ophthalmology;  Laterality: Right;  [place] 00:57 AP% 20.9 CDE 11.99 fluid pack lot #[id number]  . CHOLECYSTECTOMY    . COLON SURGERY     blocked colon  . GALLBLADDER SURGERY    . GASTRIC BYPASS  2010  . internal bleeding  2016  . MOUTH SURGERY    . OVARY SURGERY    . TONSILLECTOMY      There were no vitals filed for this visit.      Subjective Assessment - 09/18/16 1835    Subjective Patient reports good results with decreased pain in back and right foot and ankle following previous session. 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    Pertinent History chronic back pain and foot pain over many years since 1995 with previous physical therapy with good results.    Limitations Sitting;Standing;Walking;House hold activities   How long can you sit comfortably? <1 hour   How long can you stand comfortably? <20 min   How long can you  walk comfortably? only short distances   Patient Stated Goals Patient wants to walk more normally with less pain in back and is preparing for right foot surgery   Currently in Pain? Yes   Pain Score 8    Pain Location Back   Pain Orientation Right;Lower   Pain Descriptors / Indicators Aching;Tightness   Pain Type Chronic pain   Pain Onset More than a month ago   Pain Frequency Intermittent      Objective;  Palpation: hypersensitive to palpation bilateral paravertebral muscles lumbar spine bilaterally with spasms along right lower lumbar spine Gait: ambulating with SPC with antalgic gait pattern, decreased trunk rotation, guarded posture  Treatment: Manual therapy: STM performed with patient seated in massage chair to lumbar spine, concentration on right lower  back/gluteus medius region, superficial techniques for decreasing pain, spasms   Modalities: Electrical stimulation: therapistapplied (4) electrodes tolumbar spine bilaterally (2) electrodes applied to right foot plantar aspect and lateral aspect of ankle: high volt for spasm reduction, intensity to tolerance with moist heat pack appllied to same with patient seated In massagechair x 20minutes: goal; pain, spasms; no adverse skin reaction noted   Patient response to treatment: Patient with 25% decreased pain in right lower back and right foot following treatment and able to walk with less difficulty. She continued with stiffness in lower back and right foot.           PT Education - 09/18/16 1935    Education provided Yes   Education Details HEP: continue with posture awareness and exercises for stabilization in sitting   Person(s) Educated Patient   Methods Explanation   Comprehension Verbalized understanding             PT Long Term Goals - 09/03/16 2040      PT LONG TERM GOAL #1   Title Patient will improve MODI score to 30% or better by 10/15/2016 demonstrating improved function with daily tasks and able to stand, sit and walk with less difficulty   Baseline MODI 58% self perceived impairment (0 = no impairment)   Status New     PT LONG TERM GOAL #2   Title Patient will demonstrate improved foot ankle disablity score to 40% or better indicating improvement with sleep, daily activties involving standing by 10/15/2016   Baseline foot/ankle disabiltiy score 53%   Status New     PT LONG TERM GOAL #3   Title Patient will ben independent with home program for pain control and progressive exercises for flexibility and strength in lumbar spine/right LE by 10/15/2016 in order to transition to home program as she prepares for foot surgery   Baseline limited knowldge of appropriate exercises and progression without cuing, instruction   Status New               Plan -  09/18/16 1953    Clinical Impression Statement Patient continues with lower back pain with right LE and foot pain that limits her ability to walk and perform daily tasks without difficulty. she is responding slowly to treatment with minimal carry over due to right foot disability that contributes to poor gait pattern and increase stress on right LE and lower back.    Rehab Potential Fair   Clinical Impairments Affecting Rehab Potential (+) motivated (-) muliple co morbidities, chronic condition   PT Frequency 2x / week   PT Duration 6 weeks   PT Treatment/Interventions Electrical Stimulation;Moist Heat;Ultrasound;Patient/family education;Neuromuscular re-education;Therapeutic exercise;Manual techniques   PT Next Visit Plan pain  control, ther. ex, manual therapy   PT Home Exercise Plan stabilization exercises, strengthening as instructed for right LE/ankle      Patient will benefit from skilled therapeutic intervention in order to improve the following deficits and impairments:  Decreased strength, Impaired flexibility, Decreased activity tolerance, Impaired perceived functional ability, Pain, Decreased endurance, Difficulty walking  Visit Diagnosis: Difficulty in walking, not elsewhere classified  Muscle weakness (generalized)  Other muscle spasm  Pain in right ankle and joints of right foot  Chronic bilateral low back pain with right-sided sciatica     Problem List Patient Active Problem List   Diagnosis Date Noted  . Breast cancer screening 06/18/2016  . Knee pain 04/03/2016  . Hyperkalemia 03/01/2016  . Hyponatremia 03/01/2016  . High triglycerides 12/24/2015  . Depression with anxiety 12/24/2015  . Pes planus of both feet 11/16/2015  . Plantar fasciitis of right foot 11/16/2015  . Ingrown toenail 11/16/2015  . Heel spur 11/16/2015  . Diabetic neuropathy (HCC) 11/16/2015  . Right ankle pain 11/16/2015  . Controlled substance agreement signed 11/16/2015  . Medication  monitoring encounter 11/16/2015  . Opiate use 11/16/2015  . Diabetes mellitus without complication (HCC) 08/15/2015  . Need for Tdap vaccination 08/15/2015  . Chronic pain of multiple joints 08/15/2015  . Abnormal mammogram of right breast 06/19/2015  . Cerumen impaction 03/16/2015  . Hypertension goal BP (blood pressure) < 140/90 12/14/2014  . Hyperlipidemia LDL goal <100 12/14/2014  . History of transfusion of packed RBC 12/14/2014  . COPD, mild (HCC) 12/14/2014  . Cigarette smoker 12/14/2014  . Status post bariatric surgery 12/14/2014  . Obesity 12/14/2014  . Chronic radicular lumbar pain 12/14/2014  . History of GI bleed 11/12/2014  . GERD without esophagitis 11/12/2014  . History of small bowel obstruction 09/07/2014    Beacher May PT 09/19/2016, 9:42 PM  [place] [facility] SPORTS MEDICINE [address], [place], [address] Phone: [phone]   Fax:  [phone]  Name: Andrea Holden MRN: [id number] Date of Birth: [birth year]

## 2016-09-23 ENCOUNTER — Encounter: Payer: Medicare Other | Admitting: Physical Therapy

## 2016-09-23 ENCOUNTER — Ambulatory Visit: Payer: Medicare PPO | Admitting: Physical Therapy

## 2016-09-25 ENCOUNTER — Ambulatory Visit: Payer: Medicare PPO | Admitting: Physical Therapy

## 2016-09-25 ENCOUNTER — Encounter: Payer: Medicare Other | Admitting: Physical Therapy

## 2016-09-25 DIAGNOSIS — M545 Low back pain, unspecified: Secondary | ICD-10-CM | POA: Insufficient documentation

## 2016-09-26 ENCOUNTER — Encounter: Payer: Self-pay | Admitting: Physical Therapy

## 2016-09-26 ENCOUNTER — Ambulatory Visit: Payer: Medicare PPO | Admitting: Physical Therapy

## 2016-09-26 DIAGNOSIS — M25571 Pain in right ankle and joints of right foot: Secondary | ICD-10-CM

## 2016-09-26 DIAGNOSIS — R262 Difficulty in walking, not elsewhere classified: Secondary | ICD-10-CM

## 2016-09-26 DIAGNOSIS — M62838 Other muscle spasm: Secondary | ICD-10-CM

## 2016-09-26 DIAGNOSIS — M5441 Lumbago with sciatica, right side: Secondary | ICD-10-CM

## 2016-09-26 DIAGNOSIS — G8929 Other chronic pain: Secondary | ICD-10-CM

## 2016-09-27 NOTE — Therapy (Signed)
[place] [facility] [address], [place], [address] Phone: [phone]   Fax:  [phone]  Physical Therapy Treatment  Patient Details  Name: Andrea Holden MRN: [id number] Date of Birth: 20-Dec-1945 Referring Provider: Alleen Borne MD  Encounter Date: 09/26/2016      PT End of Session - 09/26/16 1747    Visit Number 7   Number of Visits 12   Date for PT Re-Evaluation 10/15/16   Authorization Type 7   Authorization Time Period 10 (G code)   PT Start Time 1705   PT Stop Time 1745   PT Time Calculation (min) 40 min   Activity Tolerance Patient tolerated treatment well;Patient limited by pain   Behavior During Therapy [facility] for tasks assessed/performed      Past Medical History:  Diagnosis Date  . Allergy    seasonal allergies  . Anemia   . Anxiety   . Arthritis   . Asthma    allergy induced asthma  . Cataract   . Clotting disorder (HCC)    history of blood clots  . Complication of anesthesia    arrhythmia following colonoscopy  . Degenerative disc disease, lumbar   . Degenerative disc disease, lumbar   . Depression   . Diabetes mellitus   . Diabetic neuropathy (HCC) 11/16/2015  . Dysrhythmia   . GERD (gastroesophageal reflux disease)   . H/O eating disorder   . H/O transfusion    patient was given 5 units of blood while at [facility], blood type O+  . History of chicken pox   . History of hiatal hernia   . History of measles, mumps, or rubella   . HOH (hard of hearing)   . Hyperlipidemia   . Hypertension   . Irregular heartbeat   . Moderate COPD (chronic obstructive pulmonary disease) (HCC) 12/14/2014  . Neuropathy (HCC)   . Opiate use 11/16/2015  . Orthopnea   . Pes planus of both feet 11/16/2015  . Plantar fasciitis of right foot 11/16/2015  . Wheezing     Past Surgical History:  Procedure Laterality Date  . ABDOMINAL HYSTERECTOMY    . CATARACT EXTRACTION W/PHACO Right 05/30/2015    Procedure: CATARACT EXTRACTION PHACO AND INTRAOCULAR LENS PLACEMENT (IOC);  Surgeon: Galen Manila, MD;  Location: [facility] ORS;  Service: Ophthalmology;  Laterality: Right;  [place] 00:57 AP% 20.9 CDE 11.99 fluid pack lot #[id number]  . CHOLECYSTECTOMY    . COLON SURGERY     blocked colon  . GALLBLADDER SURGERY    . GASTRIC BYPASS  2010  . internal bleeding  2016  . MOUTH SURGERY    . OVARY SURGERY    . TONSILLECTOMY      There were no vitals filed for this visit.      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 She then had right leg giving way and went to orthopedist and is now on prednisone taper.    Pertinent History chronic back pain and foot pain over many years since 1995 with previous physical therapy with good results.    Limitations Sitting;Standing;Walking;House hold activities   How long can you sit comfortably? <1 hour   How long can you stand comfortably? <20 min   How long can  you walk comfortably? only short distances   Patient Stated Goals Patient wants to walk more normally with less pain in back and is preparing for right foot surgery   Currently in Pain? Yes   Pain Score 9    Pain Location Back   Pain Orientation Right;Lower   Pain Descriptors / Indicators Aching;Stabbing;Spasm  worse with walking, has to take short steps   Pain Type Chronic pain;Acute pain   Pain Onset More than a month ago   Pain Frequency Intermittent        Objective;  Palpation: hypersensitive to palpation bilateral paravertebral muscles lumbar spine bilaterally with spasms along right lower lumbar spine Gait: ambulating with SPC with antalgic gait pattern, decreased trunk rotation, guarded posture with slow cadence and intermittent shouts of pain during ambulation   Transfers on/off massage chair with slow cautious movements with close supervision for safety  Treatment:  Modalities: Electrical stimulation: therapistapplied (4) electrodes tolumbar spine bilaterally (2) electrodes applied to right foot plantar aspect and lateral aspect of ankle: high volt for spasm reduction, intensity to tolerance with cold pack applliedto same with patient seated In massagechair x 30 minutes applied to same: goal; pain, spasms; no adverse skin reaction noted   Patient response to treatment: Patient demonstrated improved ability to walk with less pain and difficulty following treatment. Pain level decreased to 6/10.          PT Education - 09/26/16 1730    Education provided Yes   Education Details educated in resting and healing and moving as tolerated as her back pain improves   Person(s) Educated Patient   Methods Explanation   Comprehension Verbalized understanding             PT Long Term Goals - 09/03/16 2040      PT LONG TERM GOAL #1   Title Patient will improve MODI score to 30% or better by 10/15/2016 demonstrating improved function with daily tasks and able to stand, sit and walk with less difficulty   Baseline MODI 58% self perceived impairment (0 = no impairment)   Status New     PT LONG TERM GOAL #2   Title Patient will demonstrate improved foot ankle disablity score to 40% or better indicating improvement with sleep, daily activties involving standing by 10/15/2016   Baseline foot/ankle disabiltiy score 53%   Status New     PT LONG TERM GOAL #3   Title Patient will ben independent with home program for pain control and progressive exercises for flexibility and strength in lumbar spine/right LE by 10/15/2016 in order to transition to home program as she prepares for foot surgery   Baseline limited knowldge of appropriate exercises and progression without cuing, instruction   Status New        +        Plan - 09/26/16 1745     Clinical Impression Statement Patient presents with increased pain in lower back that limits mobility and walking ability today therefore focused on pain control. Patient was able to stand and walk with less difficulty at end of session. She will continue with pain control and begin to move, begin walking as able. She should continue to improve with additional physical therapy intervention.    Rehab Potential Fair   Clinical Impairments Affecting Rehab Potential (+) motivated (-) muliple co morbidities, chronic condition   PT Frequency 2x / week   PT Duration 6 weeks   PT Treatment/Interventions Electrical Stimulation;Moist Heat;Ultrasound;Patient/family education;Neuromuscular re-education;Therapeutic exercise;Manual techniques  PT Next Visit Plan pain control, ther. ex, manual therapy   PT Home Exercise Plan stabilization exercises, strengthening as instructed for right LE/ankle      Patient will benefit from skilled therapeutic intervention in order to improve the following deficits and impairments:  Decreased strength, Impaired flexibility, Decreased activity tolerance, Impaired perceived functional ability, Pain, Decreased endurance, Difficulty walking  Visit Diagnosis: Difficulty in walking, not elsewhere classified  Chronic bilateral low back pain with right-sided sciatica  Other muscle spasm  Pain in right ankle and joints of right foot     Problem List Patient Active Problem List   Diagnosis Date Noted  . Breast cancer screening 06/18/2016  . Knee pain 04/03/2016  . Hyperkalemia 03/01/2016  . Hyponatremia 03/01/2016  . High triglycerides 12/24/2015  . Depression with anxiety 12/24/2015  . Pes planus of both feet 11/16/2015  . Plantar fasciitis of right foot 11/16/2015  . Ingrown toenail 11/16/2015  . Heel spur 11/16/2015  . Diabetic neuropathy (HCC) 11/16/2015  . Right ankle pain 11/16/2015  . Controlled substance agreement signed 11/16/2015  . Medication  monitoring encounter 11/16/2015  . Opiate use 11/16/2015  . Diabetes mellitus without complication (HCC) 08/15/2015  . Need for Tdap vaccination 08/15/2015  . Chronic pain of multiple joints 08/15/2015  . Abnormal mammogram of right breast 06/19/2015  . Cerumen impaction 03/16/2015  . Hypertension goal BP (blood pressure) < 140/90 12/14/2014  . Hyperlipidemia LDL goal <100 12/14/2014  . History of transfusion of packed RBC 12/14/2014  . COPD, mild (HCC) 12/14/2014  . Cigarette smoker 12/14/2014  . Status post bariatric surgery 12/14/2014  . Obesity 12/14/2014  . Chronic radicular lumbar pain 12/14/2014  . History of GI bleed 11/12/2014  . GERD without esophagitis 11/12/2014  . History of small bowel obstruction 09/07/2014    Beacher May PT 09/27/2016, 8:33 PM  [facility] SPORTS MEDICINE [address], [place], [address] Phone: [phone]   Fax:  [phone]  Name: Andrea Holden MRN: [id number] Date of Birth: 1946-05-05

## 2016-09-30 ENCOUNTER — Ambulatory Visit: Payer: Medicare PPO | Attending: Orthopedic Surgery | Admitting: Physical Therapy

## 2016-09-30 DIAGNOSIS — M6281 Muscle weakness (generalized): Secondary | ICD-10-CM | POA: Diagnosis present

## 2016-09-30 DIAGNOSIS — M5441 Lumbago with sciatica, right side: Secondary | ICD-10-CM | POA: Insufficient documentation

## 2016-09-30 DIAGNOSIS — M62838 Other muscle spasm: Secondary | ICD-10-CM | POA: Diagnosis present

## 2016-09-30 DIAGNOSIS — M25571 Pain in right ankle and joints of right foot: Secondary | ICD-10-CM | POA: Diagnosis present

## 2016-09-30 DIAGNOSIS — G8929 Other chronic pain: Secondary | ICD-10-CM | POA: Insufficient documentation

## 2016-09-30 DIAGNOSIS — R262 Difficulty in walking, not elsewhere classified: Secondary | ICD-10-CM | POA: Insufficient documentation

## 2016-10-01 NOTE — Therapy (Signed)
[facility] [address] [place], [address] Phone: [phone]   Fax:  [phone]  Physical Therapy Treatment  Patient Details  Name: Andrea Holden MRN: [id number] Date of Birth: [birth year] Referring Provider: Alleen Borne MD  Encounter Date: 09/30/2016      PT End of Session - 09/30/16 1840    Visit Number 8   Number of Visits 12   Date for PT Re-Evaluation 10/15/16   Authorization Type 8   Authorization Time Period 10 (G code)   PT Start Time 1750   PT Stop Time 1838   PT Time Calculation (min) 48 min   Activity Tolerance Patient tolerated treatment well;Patient limited by pain   Behavior During Therapy [facility] for tasks assessed/performed      Past Medical History:  Diagnosis Date  . Allergy    seasonal allergies  . Anemia   . Anxiety   . Arthritis   . Asthma    allergy induced asthma  . Cataract   . Clotting disorder (HCC)    history of blood clots  . Complication of anesthesia    arrhythmia following colonoscopy  . Degenerative disc disease, lumbar   . Degenerative disc disease, lumbar   . Depression   . Diabetes mellitus   . Diabetic neuropathy (HCC) 11/16/2015  . Dysrhythmia   . GERD (gastroesophageal reflux disease)   . H/O eating disorder   . H/O transfusion    patient was given 5 units of blood while at [facility], blood type O+  . History of chicken pox   . History of hiatal hernia   . History of measles, mumps, or rubella   . HOH (hard of hearing)   . Hyperlipidemia   . Hypertension   . Irregular heartbeat   . Moderate COPD (chronic obstructive pulmonary disease) (HCC) 12/14/2014  . Neuropathy (HCC)   . Opiate use 11/16/2015  . Orthopnea   . Pes planus of both feet 11/16/2015  . Plantar fasciitis of right foot 11/16/2015  . Wheezing     Past Surgical History:  Procedure Laterality Date  . ABDOMINAL HYSTERECTOMY    . CATARACT EXTRACTION W/PHACO Right 05/30/2015   Procedure: CATARACT EXTRACTION PHACO AND INTRAOCULAR LENS PLACEMENT (IOC);  Surgeon: Galen Manila, MD;  Location: [facility] ORS;  Service: Ophthalmology;  Laterality: Right;  [place] 00:57 AP% 20.9 CDE 11.99 fluid pack lot #[id number]  . CHOLECYSTECTOMY    . COLON SURGERY     blocked colon  . GALLBLADDER SURGERY    . GASTRIC BYPASS  2010  . internal bleeding  2016  . MOUTH SURGERY    . OVARY SURGERY    . TONSILLECTOMY      There were no vitals filed for this visit.      Subjective Assessment - 09/30/16 1755    Subjective Patient reports her back pain is 75% better with less pain since beginning prednisone. She is reporting pain in back and right foot.    Pertinent History chronic back pain and foot pain over many years since 1995 with previous physical therapy with good results.    Limitations Sitting;Standing;Walking;House hold activities   How long can you sit comfortably? <1 hour   How long can you stand comfortably? <20 min   How long can you walk comfortably? only short distances   Patient Stated Goals Patient wants to walk more normally with less pain in back and is preparing for right foot surgery  Currently in Pain? Yes   Pain Score 6    Pain Location Back   Pain Orientation Right;Lower   Pain Descriptors / Indicators Aching;Spasm   Pain Type Chronic pain   Pain Onset More than a month ago   Pain Frequency Intermittent      Objective;  Palpation: + spasms along paravertebral muscles bilateral thoracic to lumbar spine Gait: ambulating with SPC with antalgic gait pattern, decreased trunk rotation, guarded posture, improved from previous session  Treatment: Manual therapy: STM performed with patient seated in massage chair to lumbar spine, concentration on right lower back/gluteus medius region, superficial techniques for decreasing pain, spasms   Modalities: Electrical stimulation: therapistapplied (4) electrodes tolumbar spine bilaterally (2) electrodes applied to  right foot plantar aspect and lateral aspect of ankle: high volt for spasm reduction, intensity to tolerance with moist heat pack applliedto back with patient seated In massagechair x : goal; pain, spasms; no adverse skin reaction noted   Patient response to treatment: Patient demonstrated improved soft tissue elasticity, decreased spasms by 30% and able to stand and walk with more erect posture and less antalgic gait pattern at end of session. Pain level decreased from 6/10 to 4/10 with treatment.            PT Education - 09/30/16 1833    Education provided Yes   Education Details HEP: re inforced home program to continue as pain allows   Person(s) Educated Patient   Methods Explanation   Comprehension Verbalized understanding             PT Long Term Goals - 09/03/16 2040      PT LONG TERM GOAL #1   Title Patient will improve MODI score to 30% or better by 10/15/2016 demonstrating improved function with daily tasks and able to stand, sit and walk with less difficulty   Baseline MODI 58% self perceived impairment (0 = no impairment)   Status New     PT LONG TERM GOAL #2   Title Patient will demonstrate improved foot ankle disablity score to 40% or better indicating improvement with sleep, daily activties involving standing by 10/15/2016   Baseline foot/ankle disabiltiy score 53%   Status New     PT LONG TERM GOAL #3   Title Patient will ben independent with home program for pain control and progressive exercises for flexibility and strength in lumbar spine/right LE by 10/15/2016 in order to transition to home program as she prepares for foot surgery   Baseline limited knowldge of appropriate exercises and progression without cuing, instruction   Status New               Plan - 09/30/16 1842    Clinical Impression Statement Patient's pain is improved since previous session and She is able to move and tolerate soft tissue mobilization with minimal  tenderness. she responded well to treamtnet iwth decreased pain and improved soft tissue elasticity and should continue to progress with additional phyisical therapy intervention.    Rehab Potential Fair   Clinical Impairments Affecting Rehab Potential (+) motivated (-) muliple co morbidities, chronic condition   PT Frequency 2x / week   PT Duration 6 weeks   PT Treatment/Interventions Electrical Stimulation;Moist Heat;Ultrasound;Patient/family education;Neuromuscular re-education;Therapeutic exercise;Manual techniques   PT Next Visit Plan pain control, ther. ex, manual therapy   PT Home Exercise Plan stabilization exercises, strengthening as instructed for right LE/ankle      Patient will benefit from skilled therapeutic intervention in order to improve the  following deficits and impairments:  Decreased strength, Impaired flexibility, Decreased activity tolerance, Impaired perceived functional ability, Pain, Decreased endurance, Difficulty walking  Visit Diagnosis: Difficulty in walking, not elsewhere classified  Chronic bilateral low back pain with right-sided sciatica  Other muscle spasm  Pain in right ankle and joints of right foot     Problem List Patient Active Problem List   Diagnosis Date Noted  . Breast cancer screening 06/18/2016  . Knee pain 04/03/2016  . Hyperkalemia 03/01/2016  . Hyponatremia 03/01/2016  . High triglycerides 12/24/2015  . Depression with anxiety 12/24/2015  . Pes planus of both feet 11/16/2015  . Plantar fasciitis of right foot 11/16/2015  . Ingrown toenail 11/16/2015  . Heel spur 11/16/2015  . Diabetic neuropathy (HCC) 11/16/2015  . Right ankle pain 11/16/2015  . Controlled substance agreement signed 11/16/2015  . Medication monitoring encounter 11/16/2015  . Opiate use 11/16/2015  . Diabetes mellitus without complication (HCC) 08/15/2015  . Need for Tdap vaccination 08/15/2015  . Chronic pain of multiple joints 08/15/2015  . Abnormal  mammogram of right breast 06/19/2015  . Cerumen impaction 03/16/2015  . Hypertension goal BP (blood pressure) < 140/90 12/14/2014  . Hyperlipidemia LDL goal <100 12/14/2014  . History of transfusion of packed RBC 12/14/2014  . COPD, mild (HCC) 12/14/2014  . Cigarette smoker 12/14/2014  . Status post bariatric surgery 12/14/2014  . Obesity 12/14/2014  . Chronic radicular lumbar pain 12/14/2014  . History of GI bleed 11/12/2014  . GERD without esophagitis 11/12/2014  . History of small bowel obstruction 09/07/2014    Beacher May PT 10/01/2016, 11:28 PM  [facility] PHYSICAL AND SPORTS MEDICINE [address] [place], [address] Phone: [phone]   Fax:  [phone]  Name: Andrea Holden MRN: [id number] Date of Birth: [birth year]

## 2016-10-03 ENCOUNTER — Ambulatory Visit: Payer: Medicare PPO | Admitting: Physical Therapy

## 2016-10-03 DIAGNOSIS — R262 Difficulty in walking, not elsewhere classified: Secondary | ICD-10-CM

## 2016-10-03 DIAGNOSIS — G8929 Other chronic pain: Secondary | ICD-10-CM

## 2016-10-03 DIAGNOSIS — M25571 Pain in right ankle and joints of right foot: Secondary | ICD-10-CM

## 2016-10-03 DIAGNOSIS — M5441 Lumbago with sciatica, right side: Secondary | ICD-10-CM

## 2016-10-03 DIAGNOSIS — M62838 Other muscle spasm: Secondary | ICD-10-CM

## 2016-10-04 ENCOUNTER — Ambulatory Visit (INDEPENDENT_AMBULATORY_CARE_PROVIDER_SITE_OTHER): Payer: Medicare PPO | Admitting: Family Medicine

## 2016-10-04 ENCOUNTER — Encounter: Payer: Self-pay | Admitting: Family Medicine

## 2016-10-04 VITALS — BP 124/76 | HR 75 | Temp 97.5°F | Resp 16 | Wt 212.8 lb

## 2016-10-04 DIAGNOSIS — M2142 Flat foot [pes planus] (acquired), left foot: Secondary | ICD-10-CM

## 2016-10-04 DIAGNOSIS — E6609 Other obesity due to excess calories: Secondary | ICD-10-CM

## 2016-10-04 DIAGNOSIS — M5416 Radiculopathy, lumbar region: Secondary | ICD-10-CM | POA: Diagnosis not present

## 2016-10-04 DIAGNOSIS — Z6838 Body mass index (BMI) 38.0-38.9, adult: Secondary | ICD-10-CM | POA: Diagnosis not present

## 2016-10-04 DIAGNOSIS — Z79899 Other long term (current) drug therapy: Secondary | ICD-10-CM

## 2016-10-04 DIAGNOSIS — M2141 Flat foot [pes planus] (acquired), right foot: Secondary | ICD-10-CM

## 2016-10-04 DIAGNOSIS — M25571 Pain in right ankle and joints of right foot: Secondary | ICD-10-CM

## 2016-10-04 DIAGNOSIS — G8929 Other chronic pain: Secondary | ICD-10-CM

## 2016-10-04 MED ORDER — ALBUTEROL SULFATE HFA 108 (90 BASE) MCG/ACT IN AERS
2.0000 | INHALATION_SPRAY | RESPIRATORY_TRACT | 1 refills | Status: DC | PRN
Start: 2016-10-04 — End: 2016-11-15

## 2016-10-04 MED ORDER — LORCASERIN HCL 10 MG PO TABS: 1.0000 | ORAL_TABLET | Freq: Two times a day (BID) | ORAL | 2 refills | Status: DC

## 2016-10-04 MED ORDER — HYDROCODONE-ACETAMINOPHEN 10-325 MG PO TABS: 0.5000 | ORAL_TABLET | Freq: Two times a day (BID) | ORAL | 0 refills | Status: DC | PRN

## 2016-10-04 NOTE — Assessment & Plan Note (Signed)
Chronic, upcoming foot and ankle surgery at [facility]

## 2016-10-04 NOTE — Assessment & Plan Note (Signed)
Has taken belviq in the past; discussed cardiac risk; discussed saxenda as another option; she chose to use the Belviq and assume risk

## 2016-10-04 NOTE — Progress Notes (Signed)
**Note De-Identified Andrea Obfuscation** BP 124/76   Pulse 75   Temp 97.5 F (36.4 C) (Oral)   Resp 16   Wt 212 lb 12.8 oz (96.5 kg)   SpO2 95%   BMI 38.92 kg/m    Subjective:    Patient ID: Andrea Holden, female    DOB: Jan 29, 1946, 71 y.o.   MRN: [id number]  HPI: Andrea Holden is a 71 y.o. female  Chief Complaint  Patient presents with  . Follow-up  . Paperwork  . Medication Refill   Patient here for f/u Going to have RIGHT foot and ankle surgery with Dr. Alleen Borne on December 11, 2016 at [facility] She has pes planus; how she has to walk affects her back Doing PT twice a week to strengthen upper body; had a flare-up last week, saw Altamese Cabal last week; he put her on 6 day steroid taper and muscle relaxers; she did slip on something and grabbed the sink after she just started the muscle relaxer; discussed risk of falls; he wanted her to take the muscle relaxer TID but she only takes it BID  She needs a refill on the hydrocodone; she had more xrays done at ortho when the flare up came; sciatic on the right was so bad; severe DDD lumbar and cervical spine she says; little piece of bone has pressed on nerve and affecting muscle she thinks; she has 2 pound weights for arms, also bands Last dose of hydrocodone was last night; no longer smoking THC  She is aware she needs to lose weight; she says she is interested in losing weight; interested in Belviq; FDA approved; eating healthy; portions are sensible "in most cases"; eats more than she realizes and gas at times; not drinking enough She loves water but it doesn't quench her thirst; she drinks Coke life caffeinated, coffee caffeinated (half)  She needs a rescue inhaler; gets wheezing and bronchitis with colds; quit smoking  Depression screen [facility] 2/9 10/04/2016 06/17/2016 02/28/2016 12/22/2015 08/15/2015  Decreased Interest 0 0 0 0 0  Down, Depressed, Hopeless 0  PHQ - 2 Score 0    Relevant past medical, surgical, family and social history  reviewed Past Medical History:  Diagnosis Date  . Allergy    seasonal allergies  . Anemia   . Anxiety   . Arthritis   . Asthma    allergy induced asthma  . Cataract   . Clotting disorder (HCC)    history of blood clots  . Complication of anesthesia    arrhythmia following colonoscopy  . Degenerative disc disease, lumbar   . Degenerative disc disease, lumbar   . Depression   . Diabetes mellitus   . Diabetic neuropathy (HCC) 11/16/2015  . Dysrhythmia   . GERD (gastroesophageal reflux disease)   . H/O eating disorder   . H/O transfusion    patient was given 5 units of blood while at [facility], blood type O+  . History of chicken pox   . History of hiatal hernia   . History of measles, mumps, or rubella   . HOH (hard of hearing)   . Hyperlipidemia   . Hypertension   . Irregular heartbeat   . Moderate COPD (chronic obstructive pulmonary disease) (HCC) 12/14/2014  . Neuropathy (HCC)   . Opiate use 11/16/2015  . Orthopnea   . Pes planus of both feet 11/16/2015  . Plantar fasciitis of right foot 11/16/2015  . Wheezing    Past Surgical  History:  Procedure Laterality Date  . ABDOMINAL HYSTERECTOMY    . CATARACT EXTRACTION W/PHACO Right 05/30/2015   Procedure: CATARACT EXTRACTION PHACO AND INTRAOCULAR LENS PLACEMENT (IOC);  Surgeon: Galen Manila, MD;  Location: [facility] ORS;  Service: Ophthalmology;  Laterality: Right;  [place] 00:57 AP% 20.9 CDE 11.99 fluid pack lot #[id number]  . CHOLECYSTECTOMY    . COLON SURGERY     blocked colon  . GALLBLADDER SURGERY    . GASTRIC BYPASS  2010  . internal bleeding  2016  . MOUTH SURGERY    . OVARY SURGERY    . TONSILLECTOMY     Family History  Problem Relation Age of Onset  . Asthma Mother   . COPD Mother   . Arthritis Father   . Depression Father   . Heart disease Father   . Hypertension Father   . Stroke Father   . Arthritis Brother   . Depression Brother   . Diabetes Brother   . Heart disease Brother   . Hyperlipidemia Brother    . Hypertension Brother   . Stroke Brother   . Vision loss Brother   . Diabetes Maternal Grandmother   . Breast cancer Neg Hx    Social History  Substance Use Topics  . Smoking status: Former Smoker    Types: Cigarettes    Quit date: 07/31/2016  . Smokeless tobacco: Never Used  . Alcohol use 0.0 oz/week     Comment: OCCASIONALLY    Interim medical history since last visit reviewed. Allergies and medications reviewed  Review of Systems Per HPI unless specifically indicated above     Objective:    BP 124/76   Pulse 75   Temp 97.5 F (36.4 C) (Oral)   Resp 16   Wt 212 lb 12.8 oz (96.5 kg)   SpO2 95%   BMI 38.92 kg/m   Wt Readings from Last 3 Encounters:  10/04/16 212 lb 12.8 oz (96.5 kg)  06/17/16 212 lb (96.2 kg)  02/28/16 210 lb (95.3 kg)    Physical Exam  Constitutional: She appears well-developed and well-nourished.  obese  HENT:  Mouth/Throat: Mucous membranes are normal.  Eyes: EOM are normal. No scleral icterus.  Cardiovascular: Normal rate and regular rhythm.   Pulmonary/Chest: Effort normal and breath sounds normal.  Musculoskeletal:       Right ankle: She exhibits deformity.       Right foot: There is deformity.  Neurological: She is alert.  Psychiatric: She has a normal mood and affect. Her behavior is normal.    Results for orders placed or performed in visit on 03/25/16  BASIC METABOLIC PANEL WITH GFR  Result Value Ref Range   Sodium 133 (L) 135 - 146 mmol/L   Potassium 4.7 3.5 - 5.3 mmol/L   Chloride 100 98 - 110 mmol/L   CO2 26 20 - 31 mmol/L   Glucose, Bld 133 (H) 65 - 99 mg/dL   BUN 17 7 - 25 mg/dL   Creat 8.11 9.14 - 7.82 mg/dL   Calcium 8.9 8.6 - 95.6 mg/dL   GFR, Est African American 70 >=60 mL/min   GFR, Est Non African American 61 >=60 mL/min      Assessment & Plan:   Problem List Items Addressed This Visit      Other   Right ankle pain    Upcoming surgery at [facility]      Pes planus of both feet    Chronic, upcoming foot  and ankle surgery at [facility]  Obesity - Primary    Has taken belviq in the past; discussed cardiac risk; discussed saxenda as another option; she chose to use the Belviq and assume risk      Relevant Medications   Lorcaserin HCl (BELVIQ) 10 MG TABS   Controlled substance agreement signed    Renew contract today, UDS per protocol      Chronic radicular lumbar pain   Relevant Medications   HYDROcodone-acetaminophen (NORCO) 10-325 MG tablet       Follow up plan: Return in about 6 weeks (around 11/15/2016) for weight management; 3 months for pain medicine.  An after-visit summary was printed and given to the patient at check-out.  Please see the patient instructions which may contain other information and recommendations beyond what is mentioned above in the assessment and plan.  Meds ordered this encounter  Medications  . cyclobenzaprine (FLEXERIL) 5 MG tablet    Sig: Take 5 mg by mouth 2 (two) times daily.  . Lorcaserin HCl (BELVIQ) 10 MG TABS    Sig: Take 1 tablet by mouth 2 (two) times daily.    Dispense:  60 tablet    Refill:  2  . HYDROcodone-acetaminophen (NORCO) 10-325 MG tablet    Sig: Take 0.5 tablets by mouth 2 (two) times daily as needed for severe pain.    Dispense:  30 tablet    Refill:  0    Controlled substance contract  . albuterol (PROVENTIL HFA;VENTOLIN HFA) 108 (90 Base) MCG/ACT inhaler    Sig: Inhale 2 puffs into the lungs every 4 (four) hours as needed for wheezing or shortness of breath.    Dispense:  1 Inhaler    Refill:  1    No orders of the defined types were placed in this encounter.

## 2016-10-04 NOTE — Assessment & Plan Note (Signed)
Renew contract today, UDS per protocol

## 2016-10-04 NOTE — Assessment & Plan Note (Signed)
Upcoming surgery at [facility]

## 2016-10-04 NOTE — Patient Instructions (Signed)
Check out the information at familydoctor.org entitled "Nutrition for Weight Loss: What You Need to Know about Fad Diets" Try to lose between 1-2 pounds per week by taking in fewer calories and burning off more calories You can succeed by limiting portions, limiting foods dense in calories and fat, becoming more active, and drinking 8 glasses of water a day (64 ounces) Don't skip meals, especially breakfast, as skipping meals may alter your metabolism Do not use over-the-counter weight loss pills or gimmicks that claim rapid weight loss A healthy BMI (or body mass index) is between 18.5 and 24.9 You can calculate your ideal BMI at the NIH website [employer] [facility]

## 2016-10-04 NOTE — Therapy (Signed)
[facility] [address] [place], [address] Phone: [phone]   Fax:  [phone]  Physical Therapy Treatment  Patient Details  Name: Andrea Holden MRN: [id number] Date of Birth: 1946-04-11 Referring Provider: Alleen Borne MD  Encounter Date: 10/03/2016      PT End of Session - 10/03/16 1840    Visit Number 9   Number of Visits 12   Date for PT Re-Evaluation 10/15/16   Authorization Type 9   Authorization Time Period 10 (G code)   PT Start Time 1755   PT Stop Time 1835   PT Time Calculation (min) 40 min   Activity Tolerance Patient tolerated treatment well;Patient limited by pain   Behavior During Therapy [facility] for tasks assessed/performed      Past Medical History:  Diagnosis Date  . Allergy    seasonal allergies  . Anemia   . Anxiety   . Arthritis   . Asthma    allergy induced asthma  . Cataract   . Clotting disorder (HCC)    history of blood clots  . Complication of anesthesia    arrhythmia following colonoscopy  . Degenerative disc disease, lumbar   . Degenerative disc disease, lumbar   . Depression   . Diabetes mellitus   . Diabetic neuropathy (HCC) 11/16/2015  . Dysrhythmia   . GERD (gastroesophageal reflux disease)   . H/O eating disorder   . H/O transfusion    patient was given 5 units of blood while at [facility], blood type O+  . History of chicken pox   . History of hiatal hernia   . History of measles, mumps, or rubella   . HOH (hard of hearing)   . Hyperlipidemia   . Hypertension   . Irregular heartbeat   . Moderate COPD (chronic obstructive pulmonary disease) (HCC) 12/14/2014  . Neuropathy (HCC)   . Opiate use 11/16/2015  . Orthopnea   . Pes planus of both feet 11/16/2015  . Plantar fasciitis of right foot 11/16/2015  . Wheezing     Past Surgical History:  Procedure Laterality Date  . ABDOMINAL HYSTERECTOMY    . CATARACT EXTRACTION W/PHACO Right 05/30/2015   Procedure: CATARACT EXTRACTION PHACO AND INTRAOCULAR LENS PLACEMENT (IOC);  Surgeon: Galen Manila, MD;  Location: [facility] ORS;  Service: Ophthalmology;  Laterality: Right;  [place] 00:57 AP% 20.9 CDE 11.99 fluid pack lot #[id number]  . CHOLECYSTECTOMY    . COLON SURGERY     blocked colon  . GALLBLADDER SURGERY    . GASTRIC BYPASS  2010  . internal bleeding  2016  . MOUTH SURGERY    . OVARY SURGERY    . TONSILLECTOMY      There were no vitals filed for this visit.      Subjective Assessment - 10/03/16 1800    Subjective Patient reports she is able to walk with less difficulty and feels that therapy is helping to control pain and spasms. She is breaking in new shoes with increased left foot pain.   Pertinent History chronic back pain and foot pain over many years since 1995 with previous physical therapy with good results.    Limitations Sitting;Standing;Walking;House hold activities   How long can you sit comfortably? <1 hour   How long can you stand comfortably? <20 min   How long can you walk comfortably? only short distances   Patient Stated Goals Patient wants to walk more normally with less pain in back and  is preparing for right foot surgery   Currently in Pain? Yes   Pain Score 5    Pain Location Back   Pain Orientation Right;Lower   Pain Descriptors / Indicators Aching;Spasm   Pain Type Chronic pain   Pain Onset More than a month ago   Pain Frequency Intermittent      Objective;  Palpation: mild tenderness, spasms palpable along mid to lower spine parspinal muscles Gait: ambulating with SPC with antalgic gait pattern, improved cadence as noted from previous session Treatment: Manual therapy: STM performed with patient seated in massage chair to lumbar spine, concentration on right lower back/gluteus medius region, superficial techniques for decreasing pain, spasms   Modalities: Electrical stimulation: therapistapplied (4) electrodes tolumbar spine bilaterally and   (2) electrodes applied to right foot/ankle medial and lateral aspect: high volt for spasm reduction, intensity to tolerance with moist heatpack applliedto back with patient seated In massagechair x : goal; pain, spasms; no adverse skin reaction noted   Patient response to treatment: Patient demonstrated improved soft tissue elasticity with spasms decreased to none palpable following treatment.  Pain level decreased from 5/10 to 3/10 with treatment. Continued with mild difficulty getting off massage chair.         PT Education - 10/03/16 1820    Education provided Yes   Education Details HEP: continue with exercises as tolerated, posture awareness, energy conservation   Person(s) Educated Patient   Methods Explanation   Comprehension Verbalized understanding             PT Long Term Goals - 09/03/16 2040      PT LONG TERM GOAL #1   Title Patient will improve MODI score to 30% or better by 10/15/2016 demonstrating improved function with daily tasks and able to stand, sit and walk with less difficulty   Baseline MODI 58% self perceived impairment (0 = no impairment)   Status New     PT LONG TERM GOAL #2   Title Patient will demonstrate improved foot ankle disablity score to 40% or better indicating improvement with sleep, daily activties involving standing by 10/15/2016   Baseline foot/ankle disabiltiy score 53%   Status New     PT LONG TERM GOAL #3   Title Patient will ben independent with home program for pain control and progressive exercises for flexibility and strength in lumbar spine/right LE by 10/15/2016 in order to transition to home program as she prepares for foot surgery   Baseline limited knowldge of appropriate exercises and progression without cuing, instruction   Status New               Plan - 10/03/16 1840    Clinical Impression Statement Patient demonstrates improving pain, soft tissue elasticity with decreasing spasms with current  treatment with good carry over between sessions. She is beginning to walk with less difficulty and is beginning to exercise at home as able.    Rehab Potential Fair   Clinical Impairments Affecting Rehab Potential (+) motivated (-) muliple co morbidities, chronic condition   PT Frequency 2x / week   PT Duration 6 weeks   PT Treatment/Interventions Electrical Stimulation;Moist Heat;Ultrasound;Patient/family education;Neuromuscular re-education;Therapeutic exercise;Manual techniques   PT Next Visit Plan pain control, ther. ex, manual therapy   PT Home Exercise Plan stabilization exercises, strengthening as instructed for right LE/ankle      Patient will benefit from skilled therapeutic intervention in order to improve the following deficits and impairments:  Decreased strength, Impaired flexibility, Decreased activity tolerance, Impaired  perceived functional ability, Pain, Decreased endurance, Difficulty walking  Visit Diagnosis: Difficulty in walking, not elsewhere classified  Chronic bilateral low back pain with right-sided sciatica  Other muscle spasm  Pain in right ankle and joints of right foot     Problem List Patient Active Problem List   Diagnosis Date Noted  . Breast cancer screening 06/18/2016  . Knee pain 04/03/2016  . Hyperkalemia 03/01/2016  . Hyponatremia 03/01/2016  . High triglycerides 12/24/2015  . Depression with anxiety 12/24/2015  . Pes planus of both feet 11/16/2015  . Plantar fasciitis of right foot 11/16/2015  . Ingrown toenail 11/16/2015  . Heel spur 11/16/2015  . Diabetic neuropathy (HCC) 11/16/2015  . Right ankle pain 11/16/2015  . Controlled substance agreement signed 11/16/2015  . Medication monitoring encounter 11/16/2015  . Opiate use 11/16/2015  . Diabetes mellitus without complication (HCC) 08/15/2015  . Need for Tdap vaccination 08/15/2015  . Chronic pain of multiple joints 08/15/2015  . Abnormal mammogram of right breast 06/19/2015  .  Cerumen impaction 03/16/2015  . Hypertension goal BP (blood pressure) < 140/90 12/14/2014  . Hyperlipidemia LDL goal <100 12/14/2014  . History of transfusion of packed RBC 12/14/2014  . COPD, mild (HCC) 12/14/2014  . Cigarette smoker 12/14/2014  . Status post bariatric surgery 12/14/2014  . Obesity 12/14/2014  . Chronic radicular lumbar pain 12/14/2014  . History of GI bleed 11/12/2014  . GERD without esophagitis 11/12/2014  . History of small bowel obstruction 09/07/2014    Beacher May PT 10/04/2016, 8:33 PM  [facility] PH[facility] AND SPORTS MEDICINE [address], [place], [address] Phone: [phone]   Fax:  [phone]  Name: BRAYLINN GULDEN MRN: [id number] Date of Birth: 07-Mar-1946

## 2016-10-07 ENCOUNTER — Ambulatory Visit: Payer: Medicare PPO | Admitting: Physical Therapy

## 2016-10-10 ENCOUNTER — Ambulatory Visit: Payer: Medicare PPO | Admitting: Physical Therapy

## 2016-10-10 DIAGNOSIS — R262 Difficulty in walking, not elsewhere classified: Secondary | ICD-10-CM | POA: Diagnosis not present

## 2016-10-10 DIAGNOSIS — M25571 Pain in right ankle and joints of right foot: Secondary | ICD-10-CM

## 2016-10-10 DIAGNOSIS — M62838 Other muscle spasm: Secondary | ICD-10-CM

## 2016-10-10 DIAGNOSIS — M5441 Lumbago with sciatica, right side: Secondary | ICD-10-CM

## 2016-10-10 DIAGNOSIS — G8929 Other chronic pain: Secondary | ICD-10-CM

## 2016-10-10 NOTE — Therapy (Signed)
[facility] [address], [place], [address] Phone: [phone]   Fax:  [phone]  Physical Therapy Treatment/Progress Report  Patient Details  Name: Andrea Holden MRN: [id number] Date of Birth: 1945/08/24 Referring Provider: Alleen Borne MD  Encounter Date: 10/10/2016      PT End of Session - 10/10/16 2000    Visit Number 10   Number of Visits 12   Date for PT Re-Evaluation 10/15/16   Authorization Type 10   Authorization Time Period 10 (G code)   PT Start Time 1906   PT Stop Time 1950   PT Time Calculation (min) 44 min   Activity Tolerance Patient tolerated treatment well;Patient limited by pain   Behavior During Therapy [facility] for tasks assessed/performed      Past Medical History:  Diagnosis Date  . Allergy    seasonal allergies  . Anemia   . Anxiety   . Arthritis   . Asthma    allergy induced asthma  . Cataract   . Clotting disorder (HCC)    history of blood clots  . Complication of anesthesia    arrhythmia following colonoscopy  . Degenerative disc disease, lumbar   . Degenerative disc disease, lumbar   . Depression   . Diabetes mellitus   . Diabetic neuropathy (HCC) 11/16/2015  . Dysrhythmia   . GERD (gastroesophageal reflux disease)   . H/O eating disorder   . H/O transfusion    patient was given 5 units of blood while at [facility], blood type O+  . History of chicken pox   . History of hiatal hernia   . History of measles, mumps, or rubella   . HOH (hard of hearing)   . Hyperlipidemia   . Hypertension   . Irregular heartbeat   . Moderate COPD (chronic obstructive pulmonary disease) (HCC) 12/14/2014  . Neuropathy (HCC)   . Opiate use 11/16/2015  . Orthopnea   . Pes planus of both feet 11/16/2015  . Plantar fasciitis of right foot 11/16/2015  . Wheezing     Past Surgical History:  Procedure Laterality Date  . ABDOMINAL HYSTERECTOMY    . CATARACT EXTRACTION W/PHACO  Right 05/30/2015   Procedure: CATARACT EXTRACTION PHACO AND INTRAOCULAR LENS PLACEMENT (IOC);  Surgeon: Galen Manila, MD;  Location: [facility] ORS;  Service: Ophthalmology;  Laterality: Right;  [place] 00:57 AP% 20.9 CDE 11.99 fluid pack lot #[id number]  . CHOLECYSTECTOMY    . COLON SURGERY     blocked colon  . GALLBLADDER SURGERY    . GASTRIC BYPASS  2010  . internal bleeding  2016  . MOUTH SURGERY    . OVARY SURGERY    . TONSILLECTOMY      There were no vitals filed for this visit.      Subjective Assessment - 10/10/16 1910    Subjective Patient reports she is able to walk with less difficulty and feels that therapy is helping to control pain and spasms.    Pertinent History chronic back pain and foot pain over many years since 1995 with previous physical therapy with good results.    Limitations Sitting;Standing;Walking;House hold activities   How long can you sit comfortably? <1 hour   How long can you stand comfortably? <20 min   How long can you walk comfortably? only short distances   Patient Stated Goals Patient wants to walk more normally with less pain in back and is preparing for right foot surgery  Currently in Pain? Yes   Pain Score 4    Pain Location Back   Pain Orientation Lower;Upper   Pain Descriptors / Indicators Aching   Pain Type Chronic pain   Pain Onset More than a month ago   Pain Frequency Intermittent      Objective;  Palpation: mild tenderness, spasms palpable along mid to lower spine parspinal muscles Gait: ambulating with SPC with antalgic gait pattern, improved cadence as noted from previous session and improved posture with decreased guarding noted Outcome Measures to be re assessed next session  Treatment: Manual therapy: STM performed x 18 min. with patient seated in massage chair to thoracic to lumbar spine, concentration on lower back/gluteus medius region, superficial techniques for decreasing pain, spasms   Modalities: Electrical  stimulation: therapistapplied (4) electrodes tolumbar spine bilaterally and  (2) electrodes applied to right foot/ankle medial and lateral aspect: high volt for spasm reduction, intensity to tolerance with moist heatpack applliedto backwith patient seated In massagechair x : goal; pain, spasms; no adverse skin reaction noted   Patient response to treatment: Patient demonstrated improved soft tissue elasticity by 50% with mild to no spasms palpable at end of session and pain level improved form 5/10 to 3/10. Right ankle pain minimally changed due to chronic condition.          PT Education - 10/10/16 1950    Education provided Yes   Education Details HEP: continue with posture, LE exercises and core conrol, strength   Person(s) Educated Patient   Methods Explanation   Comprehension Verbalized understanding             PT Long Term Goals - 10/10/16 2203      PT LONG TERM GOAL #1   Title Patient will improve MODI score to 30% or better by 10/15/2016 demonstrating improved function with daily tasks and able to stand, sit and walk with less difficulty   Baseline MODI 58% self perceived impairment (0 = no impairment) MODI deerred until next session   Status On-going     PT LONG TERM GOAL #2   Title Patient will demonstrate improved foot ankle disablity score to 40% or better indicating improvement with sleep, daily activties involving standing by 10/15/2016   Baseline foot/ankle disabiltiy score 53%   Status On-going     PT LONG TERM GOAL #3   Title Patient will ben independent with home program for pain control and progressive exercises for flexibility and strength in lumbar spine/right LE by 10/15/2016 in order to transition to home program as she prepares for foot surgery   Baseline limited knowldge of appropriate exercises and progression without cuing, instruction   Status On-going               Plan - 10/10/16 2201    Clinical Impression Statement  Patient is progressing steadily with goals with decreasing pain and spasms in back and right foot and improving function with walking and daily tasks at home and work. She continues with chronic pain in right foot that is waiting for surgery and this contributes to her chronic pain in back that will require continued physical therapy interveniton to control pain and allow improved function.    Rehab Potential Fair   Clinical Impairments Affecting Rehab Potential (+) motivated (-) muliple co morbidities, chronic condition   PT Frequency 2x / week   PT Duration 6 weeks   PT Treatment/Interventions Electrical Stimulation;Moist Heat;Ultrasound;Patient/family education;Neuromuscular re-education;Therapeutic exercise;Manual techniques   PT Next Visit Plan pain control,  ther. ex, manual therapy   PT Home Exercise Plan stabilization exercises, strengthening as instructed for right LE/ankle      Patient will benefit from skilled therapeutic intervention in order to improve the following deficits and impairments:  Decreased strength, Impaired flexibility, Decreased activity tolerance, Impaired perceived functional ability, Pain, Decreased endurance, Difficulty walking  Visit Diagnosis: Difficulty in walking, not elsewhere classified  Chronic bilateral low back pain with right-sided sciatica  Other muscle spasm  Pain in right ankle and joints of right foot       G-Codes - 10/30/2016 [id number]-[id number]    Functional Assessment Tool Used (Outpatient Only) MODI, ankle/foot disability index, strength, ROM, pain, clinical judgment   Functional Limitation Mobility: Walking and moving around   Mobility: Walking and Moving Around Current Status [phone]) At least 40 percent but less than 60 percent impaired, limited or restricted   Mobility: Walking and Moving Around Goal Status [phone]) At least 20 percent but less than 40 percent impaired, limited or restricted      Problem List Patient Active Problem List    Diagnosis Date Noted  . Breast cancer screening 06/18/2016  . Knee pain 04/03/2016  . Hyperkalemia 03/01/2016  . Hyponatremia 03/01/2016  . High triglycerides 12/24/2015  . Depression with anxiety 12/24/2015  . Pes planus of both feet 11/16/2015  . Plantar fasciitis of right foot 11/16/2015  . Ingrown toenail 11/16/2015  . Heel spur 11/16/2015  . Diabetic neuropathy (HCC) 11/16/2015  . Right ankle pain 11/16/2015  . Controlled substance agreement signed 11/16/2015  . Medication monitoring encounter 11/16/2015  . Opiate use 11/16/2015  . Diabetes mellitus without complication (HCC) 08/15/2015  . Need for Tdap vaccination 08/15/2015  . Chronic pain of multiple joints 08/15/2015  . Abnormal mammogram of right breast 06/19/2015  . Cerumen impaction 03/16/2015  . Hypertension goal BP (blood pressure) < 140/90 12/14/2014  . Hyperlipidemia LDL goal <100 12/14/2014  . History of transfusion of packed RBC 12/14/2014  . COPD, mild (HCC) 12/14/2014  . Cigarette smoker 12/14/2014  . Status post bariatric surgery 12/14/2014  . Obesity 12/14/2014  . Chronic radicular lumbar pain 12/14/2014  . History of GI bleed 11/12/2014  . GERD without esophagitis 11/12/2014  . History of small bowel obstruction 09/07/2014    Beacher May PT 30-Oct-2016, 10:07 PM  [facility] PH[facility] AND SPORTS MEDICINE [address] [place], [address] Phone: [phone]   Fax:  [phone]  Name: Andrea Holden MRN: [id number] Date of Birth: April 20, 1946

## 2016-10-11 ENCOUNTER — Other Ambulatory Visit: Payer: Self-pay

## 2016-10-11 ENCOUNTER — Encounter: Payer: Self-pay | Admitting: Family Medicine

## 2016-10-11 ENCOUNTER — Telehealth: Payer: Self-pay | Admitting: Family Medicine

## 2016-10-11 DIAGNOSIS — Z9114 Patient's other noncompliance with medication regimen: Secondary | ICD-10-CM | POA: Insufficient documentation

## 2016-10-11 NOTE — Telephone Encounter (Signed)
I called again, left message; will send letter; call with any questions

## 2016-10-11 NOTE — Telephone Encounter (Signed)
I called pt to speak with her about inconsistent UDS No more pain medicine or other controlled substances from this office Asked her to call back; I'll inform her because of the presence of marijuana metabolite in her urine, the controlled substance contract is broken and we won't be able to prescribe any more controlled substances from this office ------------------------------- Staff -  Please CANCEL the [place] refills at the pharmacy as well pleasem, then remove from med list Thank you

## 2016-10-11 NOTE — Telephone Encounter (Signed)
I called [employer] this rx was printed and I guess given to the patient to send in herself, because they do not have on file and they have never filled.

## 2016-10-15 ENCOUNTER — Ambulatory Visit: Payer: Medicare PPO | Admitting: Physical Therapy

## 2016-10-22 ENCOUNTER — Ambulatory Visit: Payer: Medicare PPO | Admitting: Physical Therapy

## 2016-10-23 ENCOUNTER — Ambulatory Visit: Payer: Medicare PPO | Admitting: Physical Therapy

## 2016-10-23 ENCOUNTER — Encounter: Payer: Self-pay | Admitting: Physical Therapy

## 2016-10-23 DIAGNOSIS — R262 Difficulty in walking, not elsewhere classified: Secondary | ICD-10-CM

## 2016-10-23 DIAGNOSIS — M62838 Other muscle spasm: Secondary | ICD-10-CM

## 2016-10-23 DIAGNOSIS — M6281 Muscle weakness (generalized): Secondary | ICD-10-CM

## 2016-10-23 DIAGNOSIS — G8929 Other chronic pain: Secondary | ICD-10-CM

## 2016-10-23 DIAGNOSIS — M25571 Pain in right ankle and joints of right foot: Secondary | ICD-10-CM

## 2016-10-23 DIAGNOSIS — M5441 Lumbago with sciatica, right side: Secondary | ICD-10-CM

## 2016-10-24 ENCOUNTER — Ambulatory Visit: Payer: Medicare PPO | Admitting: Physical Therapy

## 2016-10-24 NOTE — Therapy (Signed)
[facility] [address] [place], [address] Phone: [phone]   Fax:  [phone]  Physical Therapy Treatment  Patient Details  Name: Andrea Holden MRN: [id number] Date of Birth: 1946-02-26 Referring Provider: Alleen Borne MD  Encounter Date: 10/23/2016      PT End of Session - 10/23/16 1909    Visit Number 11   Number of Visits 24   Date for PT Re-Evaluation 12/04/16   Authorization Type 11   Authorization Time Period 20 (G code)   PT Start Time 1900   PT Stop Time 1950   PT Time Calculation (min) 50 min   Activity Tolerance Patient tolerated treatment well;Patient limited by pain   Behavior During Therapy [facility] for tasks assessed/performed      Past Medical History:  Diagnosis Date  . Allergy    seasonal allergies  . Anemia   . Anxiety   . Arthritis   . Asthma    allergy induced asthma  . Cataract   . Clotting disorder (HCC)    history of blood clots  . Complication of anesthesia    arrhythmia following colonoscopy  . Degenerative disc disease, lumbar   . Degenerative disc disease, lumbar   . Depression   . Diabetes mellitus   . Diabetic neuropathy (HCC) 11/16/2015  . Dysrhythmia   . GERD (gastroesophageal reflux disease)   . H/O eating disorder   . H/O transfusion    patient was given 5 units of blood while at [facility], blood type O+  . History of chicken pox   . History of hiatal hernia   . History of measles, mumps, or rubella   . HOH (hard of hearing)   . Hyperlipidemia   . Hypertension   . Irregular heartbeat   . Moderate COPD (chronic obstructive pulmonary disease) (HCC) 12/14/2014  . Neuropathy   . Opiate use 11/16/2015  . Orthopnea   . Pes planus of both feet 11/16/2015  . Plantar fasciitis of right foot 11/16/2015  . Wheezing     Past Surgical History:  Procedure Laterality Date  . ABDOMINAL HYSTERECTOMY    . CATARACT EXTRACTION W/PHACO Right 05/30/2015   Procedure: CATARACT EXTRACTION PHACO AND INTRAOCULAR LENS PLACEMENT (IOC);  Surgeon: Galen Manila, MD;  Location: [facility] ORS;  Service: Ophthalmology;  Laterality: Right;  [place] 00:57 AP% 20.9 CDE 11.99 fluid pack lot #[id number]  . CHOLECYSTECTOMY    . COLON SURGERY     blocked colon  . GALLBLADDER SURGERY    . GASTRIC BYPASS  2010  . internal bleeding  2016  . MOUTH SURGERY    . OVARY SURGERY    . TONSILLECTOMY      There were no vitals filed for this visit.      Subjective Assessment - 10/23/16 1902    Subjective Patient reports she went on vacation and had to walk a lot of stairs (30) and she initially fatigued due to decreased endurance. She built up her endurance over the course of her vacation. She walked on the beach some as well.   Pertinent History chronic back pain and foot pain over many years since 1995 with previous physical therapy with good results.    Limitations Sitting;Standing;Walking;House hold activities   How long can you sit comfortably? <1 hour   How long can you stand comfortably? <20 min   How long can you walk comfortably? only short distances   Patient Stated Goals Patient wants to  walk more normally with less pain in back and is preparing for right foot surgery   Currently in Pain? Yes   Pain Score 4    Pain Location Back   Pain Orientation Lower   Pain Descriptors / Indicators Aching   Pain Type Chronic pain   Pain Onset More than a month ago   Pain Frequency Intermittent      Objective;  Palpation: mild tenderness, spasms palpable along mid to lower spine parspinal muscles Gait: ambulating with SPC with antalgic gait pattern, improved cadence and erect posture as compared to previous session  Treatment: Manual therapy: STM performed x 18 min. with patient seated in massage chair to thoracic to lumbar spine, concentration on lower back region, superficial techniques for decreasing pain, spasms   Modalities: Electrical stimulation:  therapistapplied (4) electrodes to thouracic andlumbar spine bilaterally and (2) electrodes applied to right foot/ankle medial and lateral aspect: high volt for spasm reduction, intensity to tolerance with moist heatpack applliedto backand right foot/ankle with patient seated In massagechair x : goal; pain, spasms; no adverse skin reaction noted   Patient response to treatment: Patient reported decreased tenderness in lower back and right ankle by 50% following treatment. She demonstrated decreased spasms with improved soft tissue elasticity in right lower back following STM treatment.  She continued with pain and difficulty with transfers on/off chair due to right ankle limitations due to lack of joint mobility.           PT Education - 10/23/16 1908    Education provided Yes   Education Details continue with stabilization, posture awareness for pain control   Person(s) Educated Patient   Methods Explanation   Comprehension Verbalized understanding             PT Long Term Goals - 10/23/16 2000      PT LONG TERM GOAL #1   Title Patient will improve MODI score to 30% or better by 12/04/2016 demonstrating improved function with daily tasks and able to stand, sit and walk with less difficulty   Baseline MODI 58% self perceived impairment (0 = no impairment) MODI 45% 10/23/2016   Status Revised     PT LONG TERM GOAL #2   Title Patient will demonstrate improved foot ankle disablity score to 40% or better indicating improvement with sleep, daily activties involving standing by 12/04/2016   Baseline foot/ankle disabiltiy score 53%   Status Revised     PT LONG TERM GOAL #3   Title Patient will ben independent with home program for pain control and progressive exercises for flexibility and strength in lumbar spine/right LE by 12/04/2016 in order to transition to home program as she prepares for foot surgery   Baseline limited knowldge of appropriate exercises and progression  without cuing, instruction   Status Revised               Plan - 10/23/16 1911    Clinical Impression Statement Patient is progressing steadily with decreased pain and spasms. She continues with limitations with ambulation due to right ankle pain and limited motion as she is waiting for surgery. She will benefit from continued physical therapy intervention to address pain, spasms to allow impoved function with daily tasks and walking activities.    Rehab Potential Fair   Clinical Impairments Affecting Rehab Potential (+) motivated (-) muliple co morbidities, chronic condition   PT Frequency 2x / week   PT Duration 6 weeks   PT Treatment/Interventions Electrical Stimulation;Moist Heat;Ultrasound;Patient/family education;Neuromuscular re-education;Therapeutic exercise;Manual  techniques   PT Next Visit Plan pain control, ther. ex, manual therapy   PT Home Exercise Plan stabilization exercises, strengthening as instructed for right LE/ankle   Consulted and Agree with Plan of Care Patient      Patient will benefit from skilled therapeutic intervention in order to improve the following deficits and impairments:  Decreased strength, Impaired flexibility, Decreased activity tolerance, Impaired perceived functional ability, Pain, Decreased endurance, Difficulty walking  Visit Diagnosis: Difficulty in walking, not elsewhere classified - Plan: PT plan of care cert/re-cert  Chronic bilateral low back pain with right-sided sciatica - Plan: PT plan of care cert/re-cert  Other muscle spasm - Plan: PT plan of care cert/re-cert  Pain in right ankle and joints of right foot - Plan: PT plan of care cert/re-cert  Muscle weakness (generalized) - Plan: PT plan of care cert/re-cert     Problem List Patient Active Problem List   Diagnosis Date Noted  . Controlled substance agreement broken 10/11/2016  . Breast cancer screening 06/18/2016  . Knee pain 04/03/2016  . Hyperkalemia 03/01/2016  .  Hyponatremia 03/01/2016  . High triglycerides 12/24/2015  . Depression with anxiety 12/24/2015  . Pes planus of both feet 11/16/2015  . Plantar fasciitis of right foot 11/16/2015  . Ingrown toenail 11/16/2015  . Heel spur 11/16/2015  . Diabetic neuropathy (HCC) 11/16/2015  . Right ankle pain 11/16/2015  . Controlled substance agreement signed 11/16/2015  . Medication monitoring encounter 11/16/2015  . Opiate use 11/16/2015  . Diabetes mellitus without complication (HCC) 08/15/2015  . Need for Tdap vaccination 08/15/2015  . Chronic pain of multiple joints 08/15/2015  . Abnormal mammogram of right breast 06/19/2015  . Cerumen impaction 03/16/2015  . Hypertension goal BP (blood pressure) < 140/90 12/14/2014  . Hyperlipidemia LDL goal <100 12/14/2014  . History of transfusion of packed RBC 12/14/2014  . COPD, mild (HCC) 12/14/2014  . Cigarette smoker 12/14/2014  . Status post bariatric surgery 12/14/2014  . Obesity 12/14/2014  . Chronic radicular lumbar pain 12/14/2014  . History of GI bleed 11/12/2014  . GERD without esophagitis 11/12/2014  . History of small bowel obstruction 09/07/2014    Beacher May PT 10/24/2016, 11:53 PM  [facility] PHYSICAL AND SPORTS MEDICINE [address] [place], [address] Phone: [phone]   Fax:  [phone]  Name: KAELEIGH WESTENDORF MRN: [id number] Date of Birth: 12-May-1946

## 2016-10-28 ENCOUNTER — Ambulatory Visit: Payer: Medicare PPO | Admitting: Physical Therapy

## 2016-10-29 ENCOUNTER — Ambulatory Visit: Payer: Medicare PPO | Attending: Orthopedic Surgery | Admitting: Physical Therapy

## 2016-10-29 DIAGNOSIS — R262 Difficulty in walking, not elsewhere classified: Secondary | ICD-10-CM | POA: Insufficient documentation

## 2016-10-29 DIAGNOSIS — M25571 Pain in right ankle and joints of right foot: Secondary | ICD-10-CM | POA: Insufficient documentation

## 2016-10-29 DIAGNOSIS — G8929 Other chronic pain: Secondary | ICD-10-CM | POA: Insufficient documentation

## 2016-10-29 DIAGNOSIS — M62838 Other muscle spasm: Secondary | ICD-10-CM | POA: Insufficient documentation

## 2016-10-29 DIAGNOSIS — M5441 Lumbago with sciatica, right side: Secondary | ICD-10-CM | POA: Insufficient documentation

## 2016-10-30 ENCOUNTER — Encounter: Payer: Self-pay | Admitting: Physical Therapy

## 2016-10-30 ENCOUNTER — Ambulatory Visit: Payer: Medicare PPO | Admitting: Physical Therapy

## 2016-10-30 DIAGNOSIS — M25571 Pain in right ankle and joints of right foot: Secondary | ICD-10-CM

## 2016-10-30 DIAGNOSIS — M62838 Other muscle spasm: Secondary | ICD-10-CM | POA: Diagnosis present

## 2016-10-30 DIAGNOSIS — M5441 Lumbago with sciatica, right side: Secondary | ICD-10-CM | POA: Diagnosis present

## 2016-10-30 DIAGNOSIS — G8929 Other chronic pain: Secondary | ICD-10-CM

## 2016-10-30 DIAGNOSIS — R262 Difficulty in walking, not elsewhere classified: Secondary | ICD-10-CM | POA: Diagnosis present
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
## 2016-11-13 ENCOUNTER — Encounter: Payer: Self-pay | Admitting: Physical Therapy

## 2016-11-13 ENCOUNTER — Ambulatory Visit: Payer: Medicare PPO | Admitting: Physical Therapy

## 2016-11-13 DIAGNOSIS — G8929 Other chronic pain: Secondary | ICD-10-CM

## 2016-11-13 DIAGNOSIS — M62838 Other muscle spasm: Secondary | ICD-10-CM

## 2016-11-13 DIAGNOSIS — M5441 Lumbago with sciatica, right side: Secondary | ICD-10-CM | POA: Diagnosis not present

## 2016-11-13 DIAGNOSIS — M25571 Pain in right ankle and joints of right foot: Secondary | ICD-10-CM

## 2016-11-13 NOTE — Therapy (Signed)
[place] [facility] [address] [place], [address] Phone: [phone]   Fax:  [phone]  Physical Therapy Treatment  Patient Details  Name: Andrea Holden MRN: [id number] Date of Birth: 11-05-1945 Referring Provider: Alleen Borne MD  Encounter Date: 11/13/2016      PT End of Session - 11/13/16 1631    Visit Number 13   Number of Visits 24   Date for PT Re-Evaluation 12/04/16   Authorization Type 13   Authorization Time Period 20 (G code)   PT Start Time 1620   PT Stop Time 1710   PT Time Calculation (min) 50 min   Activity Tolerance Patient tolerated treatment well;Patient limited by pain   Behavior During Therapy [facility] for tasks assessed/performed      Past Medical History:  Diagnosis Date  . Allergy    seasonal allergies  . Anemia   . Anxiety   . Arthritis   . Asthma    allergy induced asthma  . Cataract   . Clotting disorder (HCC)    history of blood clots  . Complication of anesthesia    arrhythmia following colonoscopy  . Degenerative disc disease, lumbar   . Degenerative disc disease, lumbar   . Depression   . Diabetes mellitus   . Diabetic neuropathy (HCC) 11/16/2015  . Dysrhythmia   . GERD (gastroesophageal reflux disease)   . H/O eating disorder   . H/O transfusion    patient was given 5 units of blood while at [facility], blood type O+  . History of chicken pox   . History of hiatal hernia   . History of measles, mumps, or rubella   . HOH (hard of hearing)   . Hyperlipidemia   . Hypertension   . Irregular heartbeat   . Moderate COPD (chronic obstructive pulmonary disease) (HCC) 12/14/2014  . Neuropathy   . Opiate use 11/16/2015  . Orthopnea   . Pes planus of both feet 11/16/2015  . Plantar fasciitis of right foot 11/16/2015  . Wheezing     Past Surgical History:  Procedure Laterality Date  . ABDOMINAL HYSTERECTOMY    . CATARACT EXTRACTION W/PHACO Right 05/30/2015   Procedure: CATARACT EXTRACTION PHACO AND INTRAOCULAR LENS PLACEMENT (IOC);  Surgeon: Galen Manila, MD;  Location: [facility] ORS;  Service: Ophthalmology;  Laterality: Right;  [place] 00:57 AP% 20.9 CDE 11.99 fluid pack lot #[id number]  . CHOLECYSTECTOMY    . COLON SURGERY     blocked colon  . GALLBLADDER SURGERY    . GASTRIC BYPASS  2010  . internal bleeding  2016  . MOUTH SURGERY    . OVARY SURGERY    . TONSILLECTOMY      There were no vitals filed for this visit.      Subjective Assessment - 11/13/16 1625    Subjective Patient reports she is having surgery for right foot 12/11/2016. Her right LE and back are still improving with less pain.    Pertinent History chronic back pain and foot pain over many years since 1995 with previous physical therapy with good results.    Limitations Sitting;Standing;Walking;House hold activities   How long can you sit comfortably? <1 hour   How long can you stand comfortably? <20 min   How long can you walk comfortably? only short distances   Patient Stated Goals Patient wants to walk more normally with less pain in back and is preparing for right foot surgery   Currently in Pain?  Yes   Pain Score 4    Pain Location Back   Pain Orientation Lower   Pain Descriptors / Indicators Aching   Pain Type Chronic pain   Pain Onset More than a month ago   Pain Frequency Intermittent      Objective;  Palpation: increased spasms, hyper sensitive to palpation bilateral upper trapezius muscles, along thoracic and lumbar paraspinal muscles right>left side Gait: SPC ambulating with mild antalgic gait pattern, decreased weight bearing right LE, increased base of support   Treatment: Manual therapy: STM performed x 25 min.with patient seated in massage chair to upper back, trapezius muscles to thoracic andlumbar spine, concentration on upper trapezius and lower back region, superficial techniques; goal: pain, spasms   Modalities: Electrical stimulation:  therapistapplied (4) electrodes to thoracic/upper trapezius muscles andlumbar spine bilaterally: high volt for spasm reduction, intensity to tolerance with moist heatpack applliedto back with patient seated In massagechair x : goal; pain, spasms; no adverse skin reaction noted   Patient response to treatment: patient demonstrated decreased spasms and improved soft tissue by >50% with mild tenderness following STM and pain level decreased to <2/10 at end of session.          PT Education - 11/13/16 1702    Education provided Yes   Education Details reviewed home exercises for core, right LE and joint protection of right foot    Person(s) Educated Patient   Methods Explanation   Comprehension Verbalized understanding             PT Long Term Goals - 10/23/16 2000      PT LONG TERM GOAL #1   Title Patient will improve MODI score to 30% or better by 12/04/2016 demonstrating improved function with daily tasks and able to stand, sit and walk with less difficulty   Baseline MODI 58% self perceived impairment (0 = no impairment) MODI 45% 10/23/2016   Status Revised     PT LONG TERM GOAL #2   Title Patient will demonstrate improved foot ankle disablity score to 40% or better indicating improvement with sleep, daily activties involving standing by 12/04/2016   Baseline foot/ankle disabiltiy score 53%   Status Revised     PT LONG TERM GOAL #3   Title Patient will ben independent with home program for pain control and progressive exercises for flexibility and strength in lumbar spine/right LE by 12/04/2016 in order to transition to home program as she prepares for foot surgery   Baseline limited knowldge of appropriate exercises and progression without cuing, instruction   Status Revised               Plan - 11/13/16 1631    Clinical Impression Statement Patient demonstrates good progress with therapy with decreasing pain and spasms with current treatment. She continues  with difficulty with walking as she prepares for surgery on right foot. She will continue to benefit from physical therapy for pain control and reduction of spasms to allow imrproved function with daily tasks.    Rehab Potential Fair   Clinical Impairments Affecting Rehab Potential (+) motivated (-) muliple co morbidities, chronic condition   PT Frequency 2x / week   PT Duration 6 weeks   PT Treatment/Interventions Electrical Stimulation;Moist Heat;Ultrasound;Patient/family education;Neuromuscular re-education;Therapeutic exercise;Manual techniques   PT Next Visit Plan pain control, ther. ex, manual therapy   PT Home Exercise Plan stabilization exercises, strengthening as instructed for right LE/ankle      Patient will benefit from skilled therapeutic intervention in order to  improve the following deficits and impairments:  Decreased strength, Impaired flexibility, Decreased activity tolerance, Impaired perceived functional ability, Pain, Decreased endurance, Difficulty walking  Visit Diagnosis: Chronic bilateral low back pain with right-sided sciatica  Other muscle spasm  Pain in right ankle and joints of right foot     Problem List Patient Active Problem List   Diagnosis Date Noted  . Controlled substance agreement broken 10/11/2016  . Breast cancer screening 06/18/2016  . Knee pain 04/03/2016  . Hyperkalemia 03/01/2016  . Hyponatremia 03/01/2016  . High triglycerides 12/24/2015  . Depression with anxiety 12/24/2015  . Pes planus of both feet 11/16/2015  . Plantar fasciitis of right foot 11/16/2015  . Ingrown toenail 11/16/2015  . Heel spur 11/16/2015  . Diabetic neuropathy (HCC) 11/16/2015  . Right ankle pain 11/16/2015  . Controlled substance agreement signed 11/16/2015  . Medication monitoring encounter 11/16/2015  . Opiate use 11/16/2015  . Diabetes mellitus without complication (HCC) 08/15/2015  . Need for Tdap vaccination 08/15/2015  . Chronic pain of multiple  joints 08/15/2015  . Abnormal mammogram of right breast 06/19/2015  . Cerumen impaction 03/16/2015  . Hypertension goal BP (blood pressure) < 140/90 12/14/2014  . Hyperlipidemia LDL goal <100 12/14/2014  . History of transfusion of packed RBC 12/14/2014  . COPD, mild (HCC) 12/14/2014  . Cigarette smoker 12/14/2014  . Status post bariatric surgery 12/14/2014  . Obesity 12/14/2014  . Chronic radicular lumbar pain 12/14/2014  . History of GI bleed 11/12/2014  . GERD without esophagitis 11/12/2014  . History of small bowel obstruction 09/07/2014    Beacher MayBrooks, Marie PT 11/14/2016, 9:26 AM  [place] [facility] [address] [place], [place], [address] Phone: [phone]   Fax:  [phone]  Name: Andrea Holden MRN: [id number] Date of Birth: April 17, 1946

## 2016-11-15 ENCOUNTER — Encounter: Payer: Self-pay | Admitting: Family Medicine

## 2016-11-15 ENCOUNTER — Ambulatory Visit (INDEPENDENT_AMBULATORY_CARE_PROVIDER_SITE_OTHER): Payer: Medicare PPO | Admitting: Family Medicine

## 2016-11-15 VITALS — BP 126/74 | HR 69 | Temp 97.7°F | Resp 14 | Wt 216.0 lb

## 2016-11-15 DIAGNOSIS — Z87891 Personal history of nicotine dependence: Secondary | ICD-10-CM | POA: Diagnosis not present

## 2016-11-15 DIAGNOSIS — E119 Type 2 diabetes mellitus without complications: Secondary | ICD-10-CM

## 2016-11-15 DIAGNOSIS — M25571 Pain in right ankle and joints of right foot: Secondary | ICD-10-CM

## 2016-11-15 DIAGNOSIS — Z9114 Patient's other noncompliance with medication regimen: Secondary | ICD-10-CM | POA: Diagnosis not present

## 2016-11-15 DIAGNOSIS — J069 Acute upper respiratory infection, unspecified: Secondary | ICD-10-CM | POA: Diagnosis not present

## 2016-11-15 DIAGNOSIS — I10 Essential (primary) hypertension: Secondary | ICD-10-CM

## 2016-11-15 DIAGNOSIS — G8929 Other chronic pain: Secondary | ICD-10-CM | POA: Diagnosis not present

## 2016-11-15 NOTE — Patient Instructions (Addendum)
Cool mist humidifier recommended Keep the dog out of the bedroom until you are well Move any potted plants or air fresheners or scented candles out of the bedroom until you are well Plain mucinex may help loosen phlegm Try vitamin C (orange juice if not diabetic or vitamin C tablets) and drink green tea to help your immune system during your illness Get plenty of rest and hydration  Try to use PLAIN allergy or cold medicine without the decongestant Avoid: phenylephrine, phenylpropanolamine, and pseudoephredine  Let me know if you decide to see a podiatrist Check your feet every night Please have your pre-op labs sent here (you are due for lipids and A1c)  Check out the information at familydoctor.org entitled "Nutrition for Weight Loss: What You Need to Know about Fad Diets" Try to lose between 1-2 pounds per week by taking in fewer calories and burning off more calories You can succeed by limiting portions, limiting foods dense in calories and fat, becoming more active, and drinking 8 glasses of water a day (64 ounces) Don't skip meals, especially breakfast, as skipping meals may alter your metabolism Do not use over-the-counter weight loss pills or gimmicks that claim rapid weight loss A healthy BMI (or body mass index) is between 18.5 and 24.9 You can calculate your ideal BMI at the NIH website JobEconomics.hu  I'll recommend 1,000 iu of vitamin D3 once a day (if you are not outdoors for 20-30 minutes that day) DASH Eating Plan DASH stands for "Dietary Approaches to Stop Hypertension." The DASH eating plan is a healthy eating plan that has been shown to reduce high blood pressure (hypertension). It may also reduce your risk for type 2 diabetes, heart disease, and stroke. The DASH eating plan may also help with weight loss. What are tips for following this plan? General guidelines   Avoid eating more than 2,300 mg (milligrams) of salt  (sodium) a day. If you have hypertension, you may need to reduce your sodium intake to 1,500 mg a day.  Limit alcohol intake to no more than 1 drink a day for nonpregnant women and 2 drinks a day for men. One drink equals 12 oz of beer, 5 oz of wine, or 1 oz of hard liquor.  Work with your health care provider to maintain a healthy body weight or to lose weight. Ask what an ideal weight is for you.  Get at least 30 minutes of exercise that causes your heart to beat faster (aerobic exercise) most days of the week. Activities may include walking, swimming, or biking.  Work with your health care provider or diet and nutrition specialist (dietitian) to adjust your eating plan to your individual calorie needs. Reading food labels   Check food labels for the amount of sodium per serving. Choose foods with less than 5 percent of the Daily Value of sodium. Generally, foods with less than 300 mg of sodium per serving fit into this eating plan.  To find whole grains, look for the word "whole" as the first word in the ingredient list. Shopping   Buy products labeled as "low-sodium" or "no salt added."  Buy fresh foods. Avoid canned foods and premade or frozen meals. Cooking   Avoid adding salt when cooking. Use salt-free seasonings or herbs instead of table salt or sea salt. Check with your health care provider or pharmacist before using salt substitutes.  Do not fry foods. Cook foods using healthy methods such as baking, boiling, grilling, and broiling instead.  Cook with  heart-healthy oils, such as olive, canola, soybean, or sunflower oil. Meal planning    Eat a balanced diet that includes:  5 or more servings of fruits and vegetables each day. At each meal, try to fill half of your plate with fruits and vegetables.  Up to 6-8 servings of whole grains each day.  Less than 6 oz of lean meat, poultry, or fish each day. A 3-oz serving of meat is about the same size as a deck of cards. One egg  equals 1 oz.  2 servings of low-fat dairy each day.  A serving of nuts, seeds, or beans 5 times each week.  Heart-healthy fats. Healthy fats called Omega-3 fatty acids are found in foods such as flaxseeds and coldwater fish, like sardines, salmon, and mackerel.  Limit how much you eat of the following:  Canned or prepackaged foods.  Food that is high in trans fat, such as fried foods.  Food that is high in saturated fat, such as fatty meat.  Sweets, desserts, sugary drinks, and other foods with added sugar.  Full-fat dairy products.  Do not salt foods before eating.  Try to eat at least 2 vegetarian meals each week.  Eat more home-cooked food and less restaurant, buffet, and fast food.  When eating at a restaurant, ask that your food be prepared with less salt or no salt, if possible. What foods are recommended? The items listed may not be a complete list. Talk with your dietitian about what dietary choices are best for you. Grains  Whole-grain or whole-wheat bread. Whole-grain or whole-wheat pasta. Brown rice. Orpah Cobb. Bulgur. Whole-grain and low-sodium cereals. Pita bread. Low-fat, low-sodium crackers. Whole-wheat flour tortillas. Vegetables  Fresh or frozen vegetables (raw, steamed, roasted, or grilled). Low-sodium or reduced-sodium tomato and vegetable juice. Low-sodium or reduced-sodium tomato sauce and tomato paste. Low-sodium or reduced-sodium canned vegetables. Fruits  All fresh, dried, or frozen fruit. Canned fruit in natural juice (without added sugar). Meat and other protein foods  Skinless chicken or [place]. Ground chicken or [place]. Pork with fat trimmed off. Fish and seafood. Egg whites. Dried beans, peas, or lentils. Unsalted nuts, nut butters, and seeds. Unsalted canned beans. Lean cuts of beef with fat trimmed off. Low-sodium, lean deli meat. Dairy  Low-fat (1%) or fat-free (skim) milk. Fat-free, low-fat, or reduced-fat cheeses. Nonfat, low-sodium  ricotta or cottage cheese. Low-fat or nonfat yogurt. Low-fat, low-sodium cheese. Fats and oils  Soft margarine without trans fats. Vegetable oil. Low-fat, reduced-fat, or light mayonnaise and salad dressings (reduced-sodium). Canola, safflower, olive, soybean, and sunflower oils. Avocado. Seasoning and other foods  Herbs. Spices. Seasoning mixes without salt. Unsalted popcorn and pretzels. Fat-free sweets. What foods are not recommended? The items listed may not be a complete list. Talk with your dietitian about what dietary choices are best for you. Grains  Baked goods made with fat, such as croissants, muffins, or some breads. Dry pasta or rice meal packs. Vegetables  Creamed or fried vegetables. Vegetables in a cheese sauce. Regular canned vegetables (not low-sodium or reduced-sodium). Regular canned tomato sauce and paste (not low-sodium or reduced-sodium). Regular tomato and vegetable juice (not low-sodium or reduced-sodium). Rosita Fire. Olives. Fruits  Canned fruit in a light or heavy syrup. Fried fruit. Fruit in cream or butter sauce. Meat and other protein foods  Fatty cuts of meat. Ribs. Fried meat. Tomasa Blase. Sausage. Bologna and other processed lunch meats. Salami. Fatback. Hotdogs. Bratwurst. Salted nuts and seeds. Canned beans with added salt. Canned or smoked fish. Whole  eggs or egg yolks. Chicken or [place] with skin. Dairy  Whole or 2% milk, cream, and half-and-half. Whole or full-fat cream cheese. Whole-fat or sweetened yogurt. Full-fat cheese. Nondairy creamers. Whipped toppings. Processed cheese and cheese spreads. Fats and oils  Butter. Stick margarine. Lard. Shortening. Ghee. Bacon fat. Tropical oils, such as coconut, palm kernel, or palm oil. Seasoning and other foods  Salted popcorn and pretzels. Onion salt, garlic salt, seasoned salt, table salt, and sea salt. Worcestershire sauce. Tartar sauce. Barbecue sauce. Teriyaki sauce. Soy sauce, including reduced-sodium. Steak sauce.  Canned and packaged gravies. Fish sauce. Oyster sauce. Cocktail sauce. Horseradish that you find on the shelf. Ketchup. Mustard. Meat flavorings and tenderizers. Bouillon cubes. Hot sauce and Tabasco sauce. Premade or packaged marinades. Premade or packaged taco seasonings. Relishes. Regular salad dressings. Where to find more information:  National Heart, Lung, and Blood Institute: PopSteam.iswww.nhlbi.nih.gov  American Heart Association: www.heart.org Summary  The DASH eating plan is a healthy eating plan that has been shown to reduce high blood pressure (hypertension). It may also reduce your risk for type 2 diabetes, heart disease, and stroke.  With the DASH eating plan, you should limit salt (sodium) intake to 2,300 mg a day. If you have hypertension, you may need to reduce your sodium intake to 1,500 mg a day.  When on the DASH eating plan, aim to eat more fresh fruits and vegetables, whole grains, lean proteins, low-fat dairy, and heart-healthy fats.  Work with your health care provider or diet and nutrition specialist (dietitian) to adjust your eating plan to your individual calorie needs. This information is not intended to replace advice given to you by your health care provider. Make sure you discuss any questions you have with your health care provider. Document Released: 06/06/2011 Document Revised: 06/10/2016 Document Reviewed: 06/10/2016 Elsevier Interactive Patient Education  2017 [employer]

## 2016-11-15 NOTE — Progress Notes (Signed)
BP 126/74   Pulse 69   Temp 97.7 F (36.5 C) (Oral)   Resp 14   Wt 216 lb (98 kg)   SpO2 95%   BMI 39.51 kg/m    Subjective:    Patient ID: Andrea Holden, female    DOB: 20-Sep-1945, 70 y.o.   MRN: [id number]  HPI: Andrea Holden is a 71 y.o. female  Chief Complaint  Patient presents with  . Follow-up    HPI She got sick on Sunday; fever has broken Phlegm was yellow but now just dry cough Tried sudafed, nighttime cough medicine OTC She watches her feet carefully; fissure has been there for years; flat-footed for years; doing surgery with Dr. Alleen Borne doing surgery soon to reconstruct her right foot; will see about the left later  She has gained four pounds; here for obesity follow-up She quit smoking, February 26th  She used to be prescribed hydrocodone here; she had a positive UDS in violation of controlled substance contract She took one hit off of marijuana one day to keep from smoking she says She received my letter about her marijuana use; she says "it's my own fault"  Prediabetes range A1c for years after having gastric bypass; was diabetic before the surgery, and A1c has been great sine then  Did not come fasting for labs today; she declined letting me get labs today She has hx of low vitamin D, last 43.2; not taking supplement  Depression screen [facility] 2/9 11/15/2016 10/04/2016 06/17/2016 02/28/2016 12/22/2015  Decreased Interest 0 0 0 0 0  Down, Depressed, Hopeless 0 1 1 1 1   PHQ - 2 Score 0 1 1 1 1    Relevant past medical, surgical, family and social history reviewed Past Medical History:  Diagnosis Date  . Allergy    seasonal allergies  . Anemia   . Anxiety   . Arthritis   . Asthma    allergy induced asthma  . Cataract   . Clotting disorder (HCC)    history of blood clots  . Complication of anesthesia    arrhythmia following colonoscopy  . Degenerative disc disease, lumbar   . Degenerative disc disease, lumbar   . Depression   . Diabetes  mellitus   . Diabetic neuropathy (HCC) 11/16/2015  . Dysrhythmia   . GERD (gastroesophageal reflux disease)   . H/O eating disorder   . H/O transfusion    patient was given 5 units of blood while at [facility], blood type O+  . History of chicken pox   . History of hiatal hernia   . History of measles, mumps, or rubella   . HOH (hard of hearing)   . Hyperlipidemia   . Hypertension   . Irregular heartbeat   . Moderate COPD (chronic obstructive pulmonary disease) (HCC) 12/14/2014  . Neuropathy   . Opiate use 11/16/2015  . Orthopnea   . Pes planus of both feet 11/16/2015  . Plantar fasciitis of right foot 11/16/2015  . Wheezing    Past Surgical History:  Procedure Laterality Date  . ABDOMINAL HYSTERECTOMY    . CATARACT EXTRACTION W/PHACO Right 05/30/2015   Procedure: CATARACT EXTRACTION PHACO AND INTRAOCULAR LENS PLACEMENT (IOC);  Surgeon: Galen Manila, MD;  Location: [facility] ORS;  Service: Ophthalmology;  Laterality: Right;  [place] 00:57 AP% 20.9 CDE 11.99 fluid pack lot #[id number]  . CHOLECYSTECTOMY    . COLON SURGERY     blocked colon  . GALLBLADDER SURGERY    . GASTRIC BYPASS  2010  . internal bleeding  2016  . MOUTH SURGERY    . OVARY SURGERY    . TONSILLECTOMY     Family History  Problem Relation Age of Onset  . Asthma Mother   . COPD Mother   . Arthritis Father   . Depression Father   . Heart disease Father   . Hypertension Father   . Stroke Father   . Arthritis Brother   . Depression Brother   . Diabetes Brother   . Heart disease Brother   . Hyperlipidemia Brother   . Hypertension Brother   . Stroke Brother   . Vision loss Brother   . Diabetes Maternal Grandmother   . Breast cancer Neg Hx    Social History   Social History  . Marital status: Divorced    Spouse name: N/A  . Number of children: N/A  . Years of education: N/A   Occupational History  . Substitute Teacher      & [employer]   Social History Main Topics  . Smoking  status: Former Smoker    Types: Cigarettes    Quit date: 07/31/2016  . Smokeless tobacco: Never Used  . Alcohol use 0.0 oz/week     Comment: OCCASIONALLY  . Drug use: No  . Sexual activity: No   Other Topics Concern  . Not on file   Social History Narrative  . No narrative on file    Interim medical history since last visit reviewed. Allergies and medications reviewed  Review of Systems Per HPI unless specifically indicated above     Objective:    BP 126/74   Pulse 69   Temp 97.7 F (36.5 C) (Oral)   Resp 14   Wt 216 lb (98 kg)   SpO2 95%   BMI 39.51 kg/m   Wt Readings from Last 3 Encounters:  11/15/16 216 lb (98 kg)  10/04/16 212 lb 12.8 oz (96.5 kg)  06/17/16 212 lb (96.2 kg)    Physical Exam  Constitutional: She appears well-developed and well-nourished.  obese  HENT:  Mouth/Throat: Mucous membranes are normal.  Eyes: EOM are normal. No scleral icterus.  Cardiovascular: Normal rate and regular rhythm.   Pulmonary/Chest: Effort normal and breath sounds normal.  Musculoskeletal:       Right ankle: She exhibits deformity.       Right foot: There is deformity (pes planus).  Neurological: She is alert.  Psychiatric: She has a normal mood and affect. Her behavior is normal.   Diabetic Foot Form - Detailed   Diabetic Foot Exam - detailed Pulse Foot Exam completed.:  Yes  Right Dorsalis Pedis:  Present Left Dorsalis Pedis:  Present  Sensory Foot Exam Completed.:  Yes Swelling:  No Semmes-Weinstein Monofilament Test R Site 1-Great Toe:  Pos L Site 1-Great Toe:  Pos  R Site 4:  Pos L Site 4:  Pos  R Site 5:  Pos L Site 5:  Pos    Comments:  Large fissured callus medial RIGHT toe; splinted callus sole of both feet, right larger than left; bunion on the LEFT foot with maceration        Assessment & Plan:   Problem List Items Addressed This Visit      Cardiovascular and Mediastinum   Hypertension goal BP (blood pressure) < 140/90    Controlled; advised  to avoid decongestants especially with colds or allergies; try to work on weight loss; DASH guidelines reinforced  Endocrine   Diabetes mellitus without complication (HCC)    Foot exam done today; she has been unsuccessful with further weight loss; healthy eating encouraged; check labs at f/u        Other   Right ankle pain    Plans for surgery soon      Morbid obesity (HCC)    BMI > 35 plus diabetes; she has been unsuccessful with further weight loss and has in fact gained 3+ pounds over last 6 weeks      Hx of smoking    Glad patient is off of cigarettes      Controlled substance agreement broken    Discussed with patient; she acknowledges her marijuana use; I will be unable to provide controlled substances for her       Other Visit Diagnoses    Viral URI    -  Primary   explained no indication for antibiotics at this time; see AVS for recommendations; avoid decongestants       Follow up plan: Return in about 4 months (around 03/18/2017) for twenty minute follow-up with fasting labs.  An after-visit summary was printed and given to the patient at check-out.  Please see the patient instructions which may contain other information and recommendations beyond what is mentioned above in the assessment and plan.  No orders of the defined types were placed in this encounter.   No orders of the defined types were placed in this encounter.

## 2016-11-16 NOTE — Assessment & Plan Note (Signed)
Glad patient is off of cigarettes

## 2016-11-16 NOTE — Assessment & Plan Note (Signed)
Controlled; advised to avoid decongestants especially with colds or allergies; try to work on weight loss; DASH guidelines reinforced

## 2016-11-16 NOTE — Assessment & Plan Note (Signed)
Plans for surgery soon

## 2016-11-16 NOTE — Assessment & Plan Note (Signed)
Discussed with patient; she acknowledges her marijuana use; I will be unable to provide controlled substances for her

## 2016-11-16 NOTE — Assessment & Plan Note (Signed)
BMI > 35 plus diabetes; she has been unsuccessful with further weight loss and has in fact gained 3+ pounds over last 6 weeks

## 2016-11-16 NOTE — Assessment & Plan Note (Signed)
Foot exam done today; she has been unsuccessful with further weight loss; healthy eating encouraged; check labs at f/u

## 2016-11-19 ENCOUNTER — Ambulatory Visit: Payer: Medicare PPO | Admitting: Physical Therapy

## 2016-11-21 ENCOUNTER — Ambulatory Visit: Payer: Medicare PPO | Admitting: Physical Therapy

## 2016-11-21 ENCOUNTER — Encounter: Payer: Self-pay | Admitting: Physical Therapy

## 2016-11-21 DIAGNOSIS — M5441 Lumbago with sciatica, right side: Secondary | ICD-10-CM | POA: Diagnosis not present

## 2016-11-21 DIAGNOSIS — R262 Difficulty in walking, not elsewhere classified: Secondary | ICD-10-CM

## 2016-11-21 DIAGNOSIS — M25571 Pain in right ankle and joints of right foot: Secondary | ICD-10-CM

## 2016-11-21 DIAGNOSIS — G8929 Other chronic pain: Secondary | ICD-10-CM

## 2016-11-21 DIAGNOSIS — M62838 Other muscle spasm: Secondary | ICD-10-CM

## 2016-11-22 NOTE — Therapy (Signed)
[facility] PHYSICAL AND SPORTS MEDICINE [address], [place], [address] Phone: [phone]   Fax:  [phone]  Physical Therapy Treatment  Patient Details  Name: Andrea Holden MRN: [id number] Date of Birth: [birth year] Referring Provider: Alleen Borne MD  Encounter Date: 11/21/2016      PT End of Session - 11/21/16 1700    Visit Number 14   Number of Visits 24   Date for PT Re-Evaluation 12/04/16   Authorization Type 14   Authorization Time Period 20 (G code)   PT Start Time 1633   PT Stop Time 1653   PT Time Calculation (min) 20 min   Activity Tolerance Patient tolerated treatment well;Patient limited by pain   Behavior During Therapy [facility] for tasks assessed/performed      Past Medical History:  Diagnosis Date  . Allergy    seasonal allergies  . Anemia   . Anxiety   . Arthritis   . Asthma    allergy induced asthma  . Cataract   . Clotting disorder (HCC)    history of blood clots  . Complication of anesthesia    arrhythmia following colonoscopy  . Degenerative disc disease, lumbar   . Degenerative disc disease, lumbar   . Depression   . Diabetes mellitus   . Diabetic neuropathy (HCC) 11/16/2015  . Dysrhythmia   . GERD (gastroesophageal reflux disease)   . H/O eating disorder   . H/O transfusion    patient was given 5 units of blood while at [facility], blood type O+  . History of chicken pox   . History of hiatal hernia   . History of measles, mumps, or rubella   . HOH (hard of hearing)   . Hyperlipidemia   . Hypertension   . Irregular heartbeat   . Moderate COPD (chronic obstructive pulmonary disease) (HCC) 12/14/2014  . Neuropathy   . Opiate use 11/16/2015  . Orthopnea   . Pes planus of both feet 11/16/2015  . Plantar fasciitis of right foot 11/16/2015  . Wheezing     Past Surgical History:  Procedure Laterality Date  . ABDOMINAL HYSTERECTOMY    . CATARACT EXTRACTION W/PHACO Right 05/30/2015    Procedure: CATARACT EXTRACTION PHACO AND INTRAOCULAR LENS PLACEMENT (IOC);  Surgeon: Galen Manila, MD;  Location: [facility] ORS;  Service: Ophthalmology;  Laterality: Right;  [place] 00:57 AP% 20.9 CDE 11.99 fluid pack lot #[id number] H  . CHOLECYSTECTOMY    . COLON SURGERY     blocked colon  . GALLBLADDER SURGERY    . GASTRIC BYPASS  2010  . internal bleeding  2016  . MOUTH SURGERY    . OVARY SURGERY    . TONSILLECTOMY      There were no vitals filed for this visit.      Subjective Assessment - 11/21/16 1634    Subjective Patient reports she is running late from work and requests soft tissue mobilizaiotn only today. She reports she is having incresaed pain and spasms on left side of back today.    Pertinent History chronic back pain and foot pain over many years since 1995 with previous physical therapy with good results.    Limitations Sitting;Standing;Walking;House hold activities   How long can you sit comfortably? <1 hour   How long can you stand comfortably? <20 min   How long can you walk comfortably? only short distances   Patient Stated Goals Patient wants to walk more normally with less pain in back and is  preparing for right foot surgery   Currently in Pain? Yes   Pain Score 5    Pain Location Back   Pain Orientation Left;Right;Mid;Lower   Pain Descriptors / Indicators Aching;Spasm   Pain Type Chronic pain   Pain Onset More than a month ago   Pain Frequency Intermittent        Objective;  Palpation: increased spasms, hyper sensitive to palpation bilateral upper trapezius muscles, along thoracic and lumbar paraspinal muscles right>left side   Treatment: Manual therapy: STM performed x 17 min.with patient seated in massage chair to mid/upper back, trapezius muscles to thoracic andlumbar spine, concentration on upper trapezius and lower back region, superficial techniques; goal: pain, spasms   Patient response to treatment: patient demonstrated decreased spasms and  improved soft tissue by >50% bilateral paraspinal muscles following STM        PT Education - 11/21/16 1650    Education provided Yes   Education Details HEP to continue for core and LE strengthening    Person(s) Educated Patient   Methods Explanation   Comprehension Verbalized understanding             PT Long Term Goals - 10/23/16 2000      PT LONG TERM GOAL #1   Title Patient will improve MODI score to 30% or better by 12/04/2016 demonstrating improved function with daily tasks and able to stand, sit and walk with less difficulty   Baseline MODI 58% self perceived impairment (0 = no impairment) MODI 45% 10/23/2016   Status Revised     PT LONG TERM GOAL #2   Title Patient will demonstrate improved foot ankle disablity score to 40% or better indicating improvement with sleep, daily activties involving standing by 12/04/2016   Baseline foot/ankle disabiltiy score 53%   Status Revised     PT LONG TERM GOAL #3   Title Patient will ben independent with home program for pain control and progressive exercises for flexibility and strength in lumbar spine/right LE by 12/04/2016 in order to transition to home program as she prepares for foot surgery   Baseline limited knowldge of appropriate exercises and progression without cuing, instruction   Status Revised               Plan - 11/21/16 1655    Clinical Impression Statement Improved spasms in mid and lower back. no estim. due to time contraints. Patient is preparing for right foot surgery and will benefit from additional physical therapy intervention to address back pain/spasms.    Rehab Potential Fair   Clinical Impairments Affecting Rehab Potential (+) motivated (-) muliple co morbidities, chronic condition   PT Frequency 2x / week   PT Duration 6 weeks   PT Treatment/Interventions Electrical Stimulation;Moist Heat;Ultrasound;Patient/family education;Neuromuscular re-education;Therapeutic exercise;Manual techniques   PT  Next Visit Plan pain control, ther. ex, manual therapy   PT Home Exercise Plan stabilization exercises, strengthening as instructed for right LE/ankle      Patient will benefit from skilled therapeutic intervention in order to improve the following deficits and impairments:  Decreased strength, Impaired flexibility, Decreased activity tolerance, Impaired perceived functional ability, Pain, Decreased endurance, Difficulty walking  Visit Diagnosis: Chronic bilateral low back pain with right-sided sciatica  Other muscle spasm  Pain in right ankle and joints of right foot  Difficulty in walking, not elsewhere classified     Problem List Patient Active Problem List   Diagnosis Date Noted  . Controlled substance agreement broken 10/11/2016  . Breast cancer screening 06/18/2016  .  Knee pain 04/03/2016  . Hyperkalemia 03/01/2016  . Hyponatremia 03/01/2016  . High triglycerides 12/24/2015  . Depression with anxiety 12/24/2015  . Pes planus of both feet 11/16/2015  . Plantar fasciitis of right foot 11/16/2015  . Ingrown toenail 11/16/2015  . Heel spur 11/16/2015  . Diabetic neuropathy (HCC) 11/16/2015  . Right ankle pain 11/16/2015  . Medication monitoring encounter 11/16/2015  . Diabetes mellitus without complication (HCC) 08/15/2015  . Need for Tdap vaccination 08/15/2015  . Chronic pain of multiple joints 08/15/2015  . Abnormal mammogram of right breast 06/19/2015  . Cerumen impaction 03/16/2015  . Hypertension goal BP (blood pressure) < 140/90 12/14/2014  . Hyperlipidemia LDL goal <100 12/14/2014  . History of transfusion of packed RBC 12/14/2014  . COPD, mild (HCC) 12/14/2014  . Hx of smoking 12/14/2014  . Status post bariatric surgery 12/14/2014  . Morbid obesity (HCC) 12/14/2014  . Chronic radicular lumbar pain 12/14/2014  . History of GI bleed 11/12/2014  . GERD without esophagitis 11/12/2014  . History of small bowel obstruction 09/07/2014    Beacher MayBrooks, Takyia Sindt  PT 11/22/2016, 9:20 PM  [facility] [address] [place], [place], [address] Phone: [phone]   Fax:  [phone]  Name: Juliette MangleDianna J Nowling MRN: [id number] Date of Birth: 1945-10-04

## 2016-11-27 ENCOUNTER — Ambulatory Visit: Payer: Medicare PPO | Admitting: Physical Therapy

## 2016-11-27 ENCOUNTER — Encounter: Payer: Self-pay | Admitting: Physical Therapy

## 2016-11-27 DIAGNOSIS — G8929 Other chronic pain: Secondary | ICD-10-CM

## 2016-11-27 DIAGNOSIS — M5441 Lumbago with sciatica, right side: Principal | ICD-10-CM

## 2016-11-27 DIAGNOSIS — M62838 Other muscle spasm: Secondary | ICD-10-CM

## 2016-11-27 NOTE — Therapy (Signed)
[facility] PHYSICAL [facility]ORTS MEDICINE [address], [place], [address] Phone: [phone]   Fax:  [phone]  Physical Therapy Treatment  Patient Details  Name: Andrea Holden MRN: [id number] Date of Birth: [birth year] Referring Provider: Alleen Borne MD  Encounter Date: 11/27/2016      PT End of Session - 11/27/16 1740    Visit Number 15   Number of Visits 24   Date for PT Re-Evaluation 12/04/16   Authorization Type 15   Authorization Time Period 20 (G code)   PT Start Time 1701   PT Stop Time 1730   PT Time Calculation (min) 29 min   Activity Tolerance Patient tolerated treatment well;Patient limited by pain   Behavior During Therapy [facility] for tasks assessed/performed      Past Medical History:  Diagnosis Date  . Allergy    seasonal allergies  . Anemia   . Anxiety   . Arthritis   . Asthma    allergy induced asthma  . Cataract   . Clotting disorder (HCC)    history of blood clots  . Complication of anesthesia    arrhythmia following colonoscopy  . Degenerative disc disease, lumbar   . Degenerative disc disease, lumbar   . Depression   . Diabetes mellitus   . Diabetic neuropathy (HCC) 11/16/2015  . Dysrhythmia   . GERD (gastroesophageal reflux disease)   . H/O eating disorder   . H/O transfusion    patient was given 5 units of blood while at [facility], blood type O+  . History of chicken pox   . History of hiatal hernia   . History of measles, mumps, or rubella   . HOH (hard of hearing)   . Hyperlipidemia   . Hypertension   . Irregular heartbeat   . Moderate COPD (chronic obstructive pulmonary disease) (HCC) 12/14/2014  . Neuropathy   . Opiate use 11/16/2015  . Orthopnea   . Pes planus of both feet 11/16/2015  . Plantar fasciitis of right foot 11/16/2015  . Wheezing     Past Surgical History:  Procedure Laterality Date  . ABDOMINAL HYSTERECTOMY    . CATARACT EXTRACTION W/PHACO Right 05/30/2015   Procedure: CATARACT EXTRACTION PHACO AND INTRAOCULAR LENS PLACEMENT (IOC);  Surgeon: Galen Manila, MD;  Location: [facility] ORS;  Service: Ophthalmology;  Laterality: Right;  [place] 00:57 AP% 20.9 CDE 11.99 fluid pack lot #[id number] H  . CHOLECYSTECTOMY    . COLON SURGERY     blocked colon  . GALLBLADDER SURGERY    . GASTRIC BYPASS  2010  . internal bleeding  2016  . MOUTH SURGERY    . OVARY SURGERY    . TONSILLECTOMY      There were no vitals filed for this visit.      Subjective Assessment - 11/27/16 1701    Subjective Patient reports she is getting ready for foot surgery 12/11/2016. She is having flare ups in back with spasms. Currently having pain in back lower region. She is also having stiffness in upper back/mid scapular region.    Pertinent History chronic back pain and foot pain over many years since 1995 with previous physical therapy with good results.    Limitations Sitting;Standing;Walking;House hold activities   How long can you sit comfortably? <1 hour   How long can you stand comfortably? <20 min   How long can you walk comfortably? only short distances   Patient Stated Goals Patient wants to walk more normally with less  pain in back and is preparing for right foot surgery   Currently in Pain? Yes   Pain Score 5    Pain Location Back   Pain Orientation Right;Left;Mid;Lower   Pain Descriptors / Indicators Aching;Spasm   Pain Type Chronic pain   Pain Onset More than a month ago   Pain Frequency Intermittent      Objective;  Palpation: increased spasms, increased tenderness along bilateral upper trapezius muscles and along bilateral paravertebral muscles thoracic to lumbar spine left>right Gait: antalgic, guarded posture  Treatment: Manual therapy: STM performed x 25 min.with patient seated in massage chair to mid/upper back, trapezius muscles tothoracic andlumbar spine, concentration on upper trapezius and lower back region, superficial techniques; goal: pain,  spasms   Patient response to treatment: Decreased spasms and pain by >50% with patient able to transfer off massage chair and ambulate with less difficulty and pain.             PT Education - 11/27/16 1730    Education provided Yes   Education Details continue with home program for pain control, core exercises and posture   Person(s) Educated Patient   Methods Explanation   Comprehension Verbalized understanding             PT Long Term Goals - 10/23/16 2000      PT LONG TERM GOAL #1   Title Patient will improve MODI score to 30% or better by 12/04/2016 demonstrating improved function with daily tasks and able to stand, sit and walk with less difficulty   Baseline MODI 58% self perceived impairment (0 = no impairment) MODI 45% 10/23/2016   Status Revised     PT LONG TERM GOAL #2   Title Patient will demonstrate improved foot ankle disablity score to 40% or better indicating improvement with sleep, daily activties involving standing by 12/04/2016   Baseline foot/ankle disabiltiy score 53%   Status Revised     PT LONG TERM GOAL #3   Title Patient will ben independent with home program for pain control and progressive exercises for flexibility and strength in lumbar spine/right LE by 12/04/2016 in order to transition to home program as she prepares for foot surgery   Baseline limited knowldge of appropriate exercises and progression without cuing, instruction   Status Revised               Plan - 11/27/16 1741    Clinical Impression Statement Patient demonstrates decreasing spasms and pain reported per patient with good carry over between sessions. She should continue to improve with continued physical therapy intervention as she prepares for right foot surgery.    Rehab Potential Fair   Clinical Impairments Affecting Rehab Potential (+) motivated (-) muliple co morbidities, chronic condition   PT Frequency 2x / week   PT Duration 6 weeks   PT Treatment/Interventions  Electrical Stimulation;Moist Heat;Ultrasound;Patient/family education;Neuromuscular re-education;Therapeutic exercise;Manual techniques   PT Next Visit Plan pain control, ther. ex, manual therapy   PT Home Exercise Plan stabilization exercises, strengthening as instructed for right LE/ankle      Patient will benefit from skilled therapeutic intervention in order to improve the following deficits and impairments:  Decreased strength, Impaired flexibility, Decreased activity tolerance, Impaired perceived functional ability, Pain, Decreased endurance, Difficulty walking  Visit Diagnosis: Chronic bilateral low back pain with right-sided sciatica  Other muscle spasm     Problem List Patient Active Problem List   Diagnosis Date Noted  . Controlled substance agreement broken 10/11/2016  . Breast cancer  screening 06/18/2016  . Knee pain 04/03/2016  . Hyperkalemia 03/01/2016  . Hyponatremia 03/01/2016  . High triglycerides 12/24/2015  . Depression with anxiety 12/24/2015  . Pes planus of both feet 11/16/2015  . Plantar fasciitis of right foot 11/16/2015  . Ingrown toenail 11/16/2015  . Heel spur 11/16/2015  . Diabetic neuropathy (HCC) 11/16/2015  . Right ankle pain 11/16/2015  . Medication monitoring encounter 11/16/2015  . Diabetes mellitus without complication (HCC) 08/15/2015  . Need for Tdap vaccination 08/15/2015  . Chronic pain of multiple joints 08/15/2015  . Abnormal mammogram of right breast 06/19/2015  . Cerumen impaction 03/16/2015  . Hypertension goal BP (blood pressure) < 140/90 12/14/2014  . Hyperlipidemia LDL goal <100 12/14/2014  . History of transfusion of packed RBC 12/14/2014  . COPD, mild (HCC) 12/14/2014  . Hx of smoking 12/14/2014  . Status post bariatric surgery 12/14/2014  . Morbid obesity (HCC) 12/14/2014  . Chronic radicular lumbar pain 12/14/2014  . History of GI bleed 11/12/2014  . GERD without esophagitis 11/12/2014  . History of small bowel  obstruction 09/07/2014    Beacher May PT 11/28/2016, 6:43 PM  [facility] PHYSICAL AND SPORTS MEDICINE [address], [place], [address] Phone: [phone]   Fax:  [phone]  Name: Andrea Holden MRN: [id number] Date of Birth: [birth year]

## 2016-12-02 ENCOUNTER — Ambulatory Visit: Payer: Medicare PPO | Admitting: Physical Therapy

## 2016-12-04 ENCOUNTER — Ambulatory Visit: Payer: Medicare PPO | Admitting: Physical Therapy

## 2016-12-06 ENCOUNTER — Ambulatory Visit (INDEPENDENT_AMBULATORY_CARE_PROVIDER_SITE_OTHER): Payer: Medicare PPO

## 2016-12-06 ENCOUNTER — Encounter: Payer: Self-pay | Admitting: Family Medicine

## 2016-12-06 VITALS — BP 134/62 | HR 77 | Temp 97.8°F | Resp 16 | Ht 62.0 in | Wt 217.3 lb

## 2016-12-06 DIAGNOSIS — Z01818 Encounter for other preprocedural examination: Secondary | ICD-10-CM | POA: Diagnosis not present

## 2016-12-10 NOTE — Progress Notes (Signed)
Pre op for surgery

## 2016-12-11 DIAGNOSIS — Z981 Arthrodesis status: Secondary | ICD-10-CM | POA: Insufficient documentation

## 2017-01-09 ENCOUNTER — Telehealth: Payer: Self-pay | Admitting: Family Medicine

## 2017-01-09 NOTE — Telephone Encounter (Signed)
Patient has an appt for next thursday

## 2017-01-09 NOTE — Telephone Encounter (Signed)
Patient notified verbal orders given

## 2017-01-09 NOTE — Telephone Encounter (Signed)
If she can't come in, then they will need to have the SURGEON who did the procedure and ordered the therapy and home health to sign her orders Remove my name from orders Thank you

## 2017-01-09 NOTE — Telephone Encounter (Signed)
Patient has had surgery on leg and unable to move around. Needs help taking bath and daily.  Nurse Vickey HugerLana also states BP was high 165/80 the rehabilitation dr. Rochele Pagesook her off bp meds.  Patient has no transportation to come in.

## 2017-01-09 NOTE — Telephone Encounter (Signed)
Pt is calling to ask for a update about [facility]. Please return her call on her cell.

## 2017-01-09 NOTE — Telephone Encounter (Signed)
ERRENOUS °

## 2017-01-09 NOTE — Telephone Encounter (Signed)
We'll need for them to tell us the diagnosis code(s), and I'll need to see her within 30 days Thank you

## 2017-01-09 NOTE — Telephone Encounter (Signed)
[facility] for [facility]illed Nursing to see patient 2 times a week for 4 weeks. Total of 8 weeks of service for skilled nursing and home aide. 2 visit per week for nursing for 4 weeks then one visit for 1 time a week for 4 weeks. 4 as needed visits and home aide visits for 2 visits a week 8 . [phone]

## 2017-01-10 ENCOUNTER — Telehealth: Payer: Self-pay | Admitting: Family Medicine

## 2017-01-10 ENCOUNTER — Telehealth: Payer: Self-pay

## 2017-01-10 MED ORDER — AMLODIPINE BESYLATE 2.5 MG PO TABS: 2.5000 mg | ORAL_TABLET | Freq: Every day | ORAL | 0 refills | Status: DC

## 2017-01-10 NOTE — Telephone Encounter (Signed)
Sierra [facility] is requesting orders for physical therapy once weekly for 3 weeks and then 2x weekly for 2 weeks. [phone]

## 2017-01-10 NOTE — Telephone Encounter (Signed)
Patient is very frustrated and upset, she states you are her primary doctor and she doesn't understand why you cannot write these orders for ramp, wheelchair, PT? I tried to explain as best as possible that this was regarding her surgery and this is why she is having these problems. Patient would like a call back ASAP.

## 2017-01-10 NOTE — Telephone Encounter (Signed)
I called patient She thinks the primary is for this She cannot come to see me She cannot get out of her house She has a home care company who said to call the primary The surgeon did not say to call me; he signed the orders [facility] BP is monitored by home health team, 180/65, they report to the nurse; I asked who is adjusting her BP medicine; she says no one They stopped her ACE-I for potassium issues She is still taking metoprolol; heart rate was 63 today She is sensitive to some medicines If BP still elevated, okay to take 5 mg daily (two of the 2.5 mg pills) so let me know Rx sent to a local pharmacy for her with note asking if they can call her to see if she wants it delivered Call me Monday or Tuesday with BP readings She has a medical alert that comes with her land line, uses it on her kneeler in case she falls [employer] will deliver food to her; watch salt I recommended

## 2017-01-10 NOTE — Telephone Encounter (Signed)
[facility] HAS CALLED AGAIN AND SAYS THAT THIS IS A NEW DEVELOPMENT AND THEY NEED AN VERBAL ORDER ON THIS PT. PLEASE CALL THEM

## 2017-01-13 ENCOUNTER — Ambulatory Visit: Payer: Medicare PPO | Admitting: Family Medicine

## 2017-01-16 ENCOUNTER — Ambulatory Visit: Payer: Medicare PPO | Admitting: Family Medicine

## 2017-01-27 ENCOUNTER — Ambulatory Visit: Payer: Medicare PPO | Admitting: Family Medicine

## 2017-01-29 ENCOUNTER — Other Ambulatory Visit: Payer: Self-pay

## 2017-01-29 ENCOUNTER — Other Ambulatory Visit: Payer: Self-pay | Admitting: Family Medicine

## 2017-01-29 MED ORDER — RANITIDINE HCL 300 MG PO TABS: 300.0000 mg | ORAL_TABLET | Freq: Every day | ORAL | 1 refills | Status: DC

## 2017-01-29 MED ORDER — SERTRALINE HCL 100 MG PO TABS: 100.0000 mg | ORAL_TABLET | Freq: Every day | ORAL | 1 refills | Status: DC

## 2017-01-29 MED ORDER — METOPROLOL SUCCINATE ER 25 MG PO TB24: 25.0000 mg | ORAL_TABLET | Freq: Every day | ORAL | 1 refills | Status: DC

## 2017-01-30 ENCOUNTER — Telehealth: Payer: Self-pay | Admitting: Family Medicine

## 2017-01-30 NOTE — Telephone Encounter (Signed)
[facility] requesting return call to discuss pt blood pressure and the patient blood pressure cuff. Pt had high blood pressure at beginning of week. Yesterday blood pressure was in normal range and pt is needing blood pressure cuff.  [phone]

## 2017-01-31 NOTE — Telephone Encounter (Signed)
Please schedule patient for an appointment for this, first opening

## 2017-01-31 NOTE — Telephone Encounter (Signed)
Spoke with lana regarding pt. She states that she is asking for a RX for a BP cuff. Her systolic blood pressure was 151 earlier this week but pt was not taking her medication, which pt stated to lana that her PT told her not to. Lana asked pt to continue to take her BP. Lana recheck her BP wednesday and it's at 131 now. Vickey HugerLana is concern about pt memory. She states she will talk to pt and she will follow what she is saying but when she ask her couple hours later and she don't remember the conversation. Vickey HugerLana denies pt having any incidents regarding leaving stove on and etc. She thinks it's more conversations she don't remember. Vickey HugerLana also mention pt is asking her to do small chorus in the house. She think pt is scared to do certain stuff in the house. She don't know how to order medicines, where its coming from or where to start one minute but at another time she remembers.  She takes her medicine but no clue what it's for and start asking questions. She also starting not able  to deal with certain situations.

## 2017-02-03 ENCOUNTER — Telehealth: Payer: Self-pay | Admitting: Family Medicine

## 2017-02-03 NOTE — Telephone Encounter (Signed)
Left voice message for pt to return call to schedule appt °

## 2017-02-03 NOTE — Telephone Encounter (Signed)
PT IS ASKING IF THE DR WILL GIVE HER A CALL FOR THE DR HAS ASKED THAT SHE CALL AND KEEP THE DR INFORMED. SHE IS HAVING SURGERY ON THIS WED FOR SURGERY.

## 2017-02-04 NOTE — Telephone Encounter (Signed)
Pt states she is having surgery tomorrow and is not able to schedule an appointment. I offered her an appointment for today but she states that she cannot not get out her home. States she still have no ramps to assist her getting out. She stated that she left a message for Dr Sherie DonLada to return her call to discuss the things that are going on before her surgery tomorrow but Dr Sherie DonLada has not returned her call as of yet.

## 2017-02-04 NOTE — Telephone Encounter (Signed)
I called patient back; she is going back in to [facility], will have echo and then CT and then going to have 6 hour surgery; the skin is not healing the way he wants it to; lots of hardware; they are going to keep her for five days and then sending her to rehab; she does not have a ramp; I asked about her BP; 134/74; I'm pleased with response to medicine; she is back on lisinopril, per her PA [facility] the orthopaedist's PA; now she has a Engineer, petroleumplastic surgeon I explained that we wanted to see her about the forgetful episodes; she is not leaving the stove on, nothing worrisome, nothing dangerous; patient does not think she has a problem, just lots on her mind Her plastic surgeon: Dr. Louanne SkyeMithani

## 2017-02-04 NOTE — Telephone Encounter (Signed)
I spoke to the patient today; see other phone note

## 2017-02-06 ENCOUNTER — Other Ambulatory Visit: Payer: Self-pay

## 2017-02-06 DIAGNOSIS — S81809A Unspecified open wound, unspecified lower leg, initial encounter: Secondary | ICD-10-CM | POA: Insufficient documentation

## 2017-02-06 MED ORDER — LISINOPRIL 5 MG PO TABS: 5.0000 mg | ORAL_TABLET | Freq: Every day | ORAL | 1 refills | Status: DC

## 2017-02-06 NOTE — Telephone Encounter (Signed)
Spoke with Andrea ReddenGabby, Pharm tech with [employer] mail delivery and she states they don't have a New Rx for lisinopril. I'm not sure if it was sent; when you look at the Rx it states 02/04/2017 but no quantity or refills has been put in.

## 2017-02-06 NOTE — Telephone Encounter (Signed)
Last prescribed in Dec 2017; taken off med list and then put back on without sending new Rx Fine for refills

## 2017-03-20 ENCOUNTER — Ambulatory Visit: Payer: Medicare PPO | Admitting: Family Medicine

## 2017-03-21 ENCOUNTER — Telehealth: Payer: Self-pay | Admitting: Family Medicine

## 2017-03-21 NOTE — Telephone Encounter (Signed)
Requesting a return call. She is still in [facility] and missed her appt on yesterday. She is needing to discuss something with you. [phone]

## 2017-03-24 NOTE — Telephone Encounter (Signed)
Called patient back states will call back later to reschedule an appt just left the hospital today

## 2017-04-01 ENCOUNTER — Telehealth: Payer: Self-pay | Admitting: Family Medicine

## 2017-04-01 NOTE — Telephone Encounter (Signed)
Home care notified.

## 2017-04-01 NOTE — Telephone Encounter (Signed)
Nicolette Brown from [facility] requesting verbals for: skilled nursing 1 time week for 1 week,  2 times a week for 1 week and every other week for 7 weeks for disease management and wound care. Also requesting home health aid for bathing aid for 2 week 2. The physical therapy has already been out there for eval and is requesting. 2 week 3. [phone]

## 2017-04-01 NOTE — Telephone Encounter (Signed)
No, sorry Orders need to come from surgeon I have not seen the patient Please have her contact the surgeon or whomever has ordered wound care and is managing her condition Thank you

## 2017-04-05 ENCOUNTER — Emergency Department: Payer: Medicare PPO

## 2017-04-05 ENCOUNTER — Encounter: Payer: Self-pay | Admitting: Emergency Medicine

## 2017-04-05 ENCOUNTER — Emergency Department
Admission: EM | Admit: 2017-04-05 | Discharge: 2017-04-05 | Disposition: A | Discharge status: Home / Self Care | Payer: Medicare PPO | Attending: Emergency Medicine | Admitting: Emergency Medicine

## 2017-04-05 DIAGNOSIS — Z87891 Personal history of nicotine dependence: Secondary | ICD-10-CM | POA: Diagnosis not present

## 2017-04-05 DIAGNOSIS — Z79899 Other long term (current) drug therapy: Secondary | ICD-10-CM | POA: Insufficient documentation

## 2017-04-05 DIAGNOSIS — I10 Essential (primary) hypertension: Secondary | ICD-10-CM | POA: Diagnosis not present

## 2017-04-05 DIAGNOSIS — J45909 Unspecified asthma, uncomplicated: Secondary | ICD-10-CM | POA: Diagnosis not present

## 2017-04-05 DIAGNOSIS — Y69 Unspecified misadventure during surgical and medical care: Secondary | ICD-10-CM | POA: Insufficient documentation

## 2017-04-05 DIAGNOSIS — Z7982 Long term (current) use of aspirin: Secondary | ICD-10-CM | POA: Diagnosis not present

## 2017-04-05 DIAGNOSIS — M79671 Pain in right foot: Secondary | ICD-10-CM | POA: Diagnosis present

## 2017-04-05 DIAGNOSIS — T8189XA Other complications of procedures, not elsewhere classified, initial encounter: Secondary | ICD-10-CM | POA: Insufficient documentation

## 2017-04-05 DIAGNOSIS — E119 Type 2 diabetes mellitus without complications: Secondary | ICD-10-CM | POA: Insufficient documentation

## 2017-04-05 LAB — BASIC METABOLIC PANEL
Anion gap: 9 (ref 5–15)
BUN: 23 mg/dL — ABNORMAL HIGH (ref 6–20)
CO2: 25 mmol/L (ref 22–32)
Calcium: 9.3 mg/dL (ref 8.9–10.3)
Chloride: 102 mmol/L (ref 101–111)
Creatinine, Ser: 0.98 mg/dL (ref 0.44–1.00)
GFR calc Af Amer: >60 mL/min (ref >60)
GFR calc non Af Amer: 57 mL/min — ABNORMAL LOW (ref >60)
Glucose, Bld: 135 mg/dL — ABNORMAL HIGH (ref 65–99)
Potassium: 5.1 mmol/L (ref 3.5–5.1)
Sodium: 136 mmol/L (ref 135–145)

## 2017-04-05 LAB — CBC WITH DIFFERENTIAL/PLATELET
Basophils Absolute: 0.1 10*3/uL (ref 0–0.1)
Basophils Relative: 1 %
Eosinophils Absolute: 0.2 10*3/uL (ref 0–0.7)
Eosinophils Relative: 2 %
HCT: 33.1 % — ABNORMAL LOW (ref 35.0–47.0)
Hemoglobin: 10.9 g/dL — ABNORMAL LOW (ref 12.0–16.0)
Lymphocytes Relative: 17 %
Lymphs Abs: 1.3 10*3/uL (ref 1.0–3.6)
MCH: 26.4 pg (ref 26.0–34.0)
MCHC: 33.1 g/dL (ref 32.0–36.0)
MCV: 79.8 fL — ABNORMAL LOW (ref 80.0–100.0)
Monocytes Absolute: 0.6 10*3/uL (ref 0.2–0.9)
Monocytes Relative: 8 %
Neutro Abs: 5.5 10*3/uL (ref 1.4–6.5)
Neutrophils Relative %: 72 %
Platelets: 305 10*3/uL (ref 150–440)
RBC: 4.15 MIL/uL (ref 3.80–5.20)
RDW: 15.4 % — ABNORMAL HIGH (ref 11.5–14.5)
WBC: 7.7 10*3/uL (ref 3.6–11.0)

## 2017-04-05 LAB — SEDIMENTATION RATE: Sed Rate: 48 mm/hr — ABNORMAL HIGH (ref 0–30)

## 2017-04-05 NOTE — ED Notes (Signed)
Patient foot wrapped and placed back in boot.

## 2017-04-05 NOTE — ED Triage Notes (Addendum)
Pt has multiple surgeries on her right foot and is reporting right foot pain she she feels "heat in her foot"  She has had multiple surgeries and has had "alot of hardware" placed in her foot as a result of surgeries r/t diabetes.  Pt VS WNL at triage and patient has no reported fevers at home.  She is reporting achy 5/10 pain in her right foot.

## 2017-04-05 NOTE — Discharge Instructions (Signed)
Your tests today do not show any signs of infection starting in the foot. Follow up with your surgeons this week as scheduled to continue monitoring your healing progress.

## 2017-04-05 NOTE — ED Provider Notes (Signed)
[facility] Emergency Department Provider Note  ____________________________________________  Time seen: Approximately 9:46 PM  I have reviewed the triage vital signs and the nursing notes.   HISTORY  Chief Complaint Infection Concern    HPI Andrea Holden is a 71 y.o. female who complains of right foot pain and warmth for the past several days. She is recently had multiple surgeries to reconstruct the right foot after a series of infections,which has been uncomplicated so far. Denies fever chills or sweats. Denies any escalating pain, but has chronic pain in the area instead since surgery. She has follow-up in 3 days with her foot surgeon, and in 6 days with her plastic surgeon. She gets frequent dressing changes by visiting wound care nurse, and denies any purulent drainage. She was concerned that there may be an increased smell coming from the wound. Her wound care nurse did not refer her to seek care, but the patient wanted to be evaluated out of abundance of caution. No proximal leg pain chest pain or shortness of breath.     Past Medical History:  Diagnosis Date  . Allergy    seasonal allergies  . Anemia   . Anxiety   . Arthritis   . Asthma    allergy induced asthma  . Cataract   . Clotting disorder ([place])    history of blood clots  . Complication of anesthesia    arrhythmia following colonoscopy  . Degenerative disc disease, lumbar   . Degenerative disc disease, lumbar   . Depression   . Diabetes mellitus   . Diabetic neuropathy ([place]) 11/16/2015  . Dysrhythmia   . GERD (gastroesophageal reflux disease)   . H/O eating disorder   . H/O transfusion    patient was given 5 units of blood while at [facility], blood type O+  . History of chicken pox   . History of hiatal hernia   . History of measles, mumps, or rubella   . HOH (hard of hearing)   . Hyperlipidemia   . Hypertension   . Irregular heartbeat   . Moderate COPD (chronic obstructive  pulmonary disease) ([place]) 12/14/2014  . Neuropathy   . Opiate use 11/16/2015  . Orthopnea   . Pes planus of both feet 11/16/2015  . Plantar fasciitis of right foot 11/16/2015  . Wheezing      Patient Active Problem List   Diagnosis Date Noted  . Controlled substance agreement broken 10/11/2016  . Breast cancer screening 06/18/2016  . Knee pain 04/03/2016  . Hyperkalemia 03/01/2016  . Hyponatremia 03/01/2016  . High triglycerides 12/24/2015  . Depression with anxiety 12/24/2015  . Pes planus of both feet 11/16/2015  . Plantar fasciitis of right foot 11/16/2015  . Ingrown toenail 11/16/2015  . Heel spur 11/16/2015  . Diabetic neuropathy ([place]) 11/16/2015  . Right ankle pain 11/16/2015  . Medication monitoring encounter 11/16/2015  . Diabetes mellitus without complication ([place]) 64/68/0321  . Need for Tdap vaccination 08/15/2015  . Chronic pain of multiple joints 08/15/2015  . Abnormal mammogram of right breast 06/19/2015  . Cerumen impaction 03/16/2015  . Hypertension goal BP (blood pressure) < 140/90 12/14/2014  . Hyperlipidemia LDL goal <100 12/14/2014  . History of transfusion of packed RBC 12/14/2014  . COPD, mild ([place]) 12/14/2014  . Hx of smoking 12/14/2014  . Status post bariatric surgery 12/14/2014  . Morbid obesity ([place]) 12/14/2014  . Chronic radicular lumbar pain 12/14/2014  . History of GI bleed 11/12/2014  . GERD without  esophagitis 11/12/2014  . History of small bowel obstruction 09/07/2014     Past Surgical History:  Procedure Laterality Date  . ABDOMINAL HYSTERECTOMY    . CATARACT EXTRACTION W/PHACO Right 05/30/2015   Procedure: CATARACT EXTRACTION PHACO AND INTRAOCULAR LENS PLACEMENT (IOC);  Surgeon: Birder Robson, MD;  Location: [facility] ORS;  Service: Ophthalmology;  Laterality: Right;  [place] 00:57 AP% 20.9 CDE 11.99 fluid pack lot #[id number]  . CHOLECYSTECTOMY    . COLON SURGERY     blocked colon  . GALLBLADDER SURGERY    . GASTRIC BYPASS  2010  .  internal bleeding  2016  . MOUTH SURGERY    . OVARY SURGERY    . TONSILLECTOMY       Prior to Admission medications   Medication Sig Start Date End Date Taking? Authorizing Provider  acetaminophen (TYLENOL) 325 MG tablet Take 325 mg by mouth every 4 (four) hours as needed.    [provider]  albuterol (PROVENTIL HFA;VENTOLIN HFA) 108 (90 Base) MCG/ACT inhaler Inhale 2 puffs into the lungs every 6 (six) hours as needed for wheezing or shortness of breath.    [provider]  aspirin EC 81 MG tablet Take 81 mg by mouth daily.    [provider]  calcium elemental as carbonate (BARIATRIC TUMS ULTRA) 400 MG chewable tablet Chew 2 tablets by mouth daily.    [provider]  fluticasone (FLONASE) 50 MCG/ACT nasal spray Place 2 sprays into the nose daily as needed.    [provider]  gabapentin (NEURONTIN) 400 MG capsule Take 400 mg by mouth 2 (two) times daily. 03/24/17 04/23/17  [provider]  lisinopril (PRINIVIL,ZESTRIL) 10 MG tablet Take 10 mg by mouth every evening. 03/24/17 04/07/17  [provider]  loratadine (CLARITIN) 10 MG tablet Take 1 tablet by mouth daily as needed.     [provider]  Omega-3 Fatty Acids (FISH OIL) 1000 MG CAPS Take 1,000 capsules by mouth every other day.    [provider]  ranitidine (ZANTAC) 300 MG tablet Take 1 tablet (300 mg total) by mouth at bedtime. 01/29/17   Arnetha Courser, MD  sertraline (ZOLOFT) 100 MG tablet Take 1 tablet (100 mg total) by mouth daily. 01/29/17   Lada, Satira Anis, MD  Turmeric 500 MG CAPS Take 500 capsules by mouth every other day.    [provider]  Vitamin D, Ergocalciferol, (DRISDOL) 50000 units CAPS capsule Take 50,000 Units by mouth once a week. 03/26/17 04/25/17  [provider]     Allergies Meloxicam and Sitagliptin   Family History  Problem Relation Age of Onset  . Asthma Mother   . COPD Mother   . Arthritis Father   .  Depression Father   . Heart disease Father   . Hypertension Father   . Stroke Father   . Arthritis Brother   . Depression Brother   . Diabetes Brother   . Heart disease Brother   . Hyperlipidemia Brother   . Hypertension Brother   . Stroke Brother   . Vision loss Brother   . Diabetes Maternal Grandmother   . Thyroid disease Daughter   . Colitis Daughter   . Breast cancer Neg Hx     Social History Social History  Substance Use Topics  . Smoking status: Former Smoker    Years: 55.00    Types: Cigarettes    Start date: 07/01/1960    Quit date: 08/26/2016  . Smokeless tobacco: Never Used  .  Alcohol use 0.0 oz/week     Comment: OCCASIONALLY    Review of Systems  Constitutional:   No fever or chills.  ENT:   No sore throat. No rhinorrhea. Cardiovascular:   No chest pain or syncope. Respiratory:   No dyspnea or cough. Gastrointestinal:   Negative for abdominal pain, vomiting and diarrhea.  Musculoskeletal:   right foot pain as above All other systems reviewed and are negative except as documented above in ROS and HPI.  ____________________________________________   PHYSICAL EXAM:  VITAL SIGNS: ED Triage Vitals  Enc Vitals Group     BP 04/05/17 2009 (!) 167/64     Pulse Rate 04/05/17 2009 71     Resp 04/05/17 2009 16     Temp 04/05/17 2009 99 F (37.2 C)     Temp Source 04/05/17 2009 Oral     SpO2 04/05/17 2009 96 %     Weight 04/05/17 2010 206 lb (93.4 kg)     Height 04/05/17 2010 _0  (1.549 m)     Head Circumference --      Peak Flow --      Pain Score 04/05/17 2008 4     Pain Loc --      Pain Edu? --      Excl. in [place]? --     Vital signs reviewed, nursing assessments reviewed.   Constitutional:   Alert and oriented. Well appearing and in no distress. Eyes:   No scleral icterus.  EOMI. No nystagmus. No conjunctival pallor. PERRL. ENT   Head:   Normocephalic and atraumatic.   Nose:   No congestion/rhinnorhea.    Neck:   No meningismus. Full  ROM. Cardiovascular:   RRR. Symmetric bilateral radial and DP pulses.  No murmurs.  Respiratory:   Normal respiratory effort without tachypnea/retractions. Breath sounds are clear and equal bilaterally. No wheezes/rales/rhonchi.  Musculoskeletal:   right foot slightly tender over the medial aspect of the distal Achilles near its insertion. There is no shortening or laxity. Achilles function is intact. The patient has multiple healing wounds with exposed subcutaneous soft tissue. There is fibrinous exudate, but no purulent drainage or necrotic tissue.no particular warmth or erythema. No induration or appreciable soft tissue swelling. There is a muscle flap from reconstructive surgery which appears to be healing well. Overall the foot appears to be healing as expected an uncomplicated. No proximal leg swelling erythema or tenderness. Calf circumference is symmetric. Neurologic:   Normal speech and language.  Motor grossly intact. No gross focal neurologic deficits are appreciated.  Skin:    Skin is warm, dry with healing surgical wounds as above  ____________________________________________    LABS (pertinent positives/negatives) (all labs ordered are listed, but only abnormal results are displayed) Labs Reviewed  CBC WITH DIFFERENTIAL/PLATELET - Abnormal; Notable for the following:       Result Value   Hemoglobin 10.9 (*)    HCT 33.1 (*)    MCV 79.8 (*)    RDW 15.4 (*)    All other components within normal limits  BASIC METABOLIC PANEL - Abnormal; Notable for the following:    Glucose, Bld 135 (*)    BUN 23 (*)    GFR calc non Af Amer 57 (*)    All other components within normal limits  SEDIMENTATION RATE - Abnormal; Notable for the following:    Sed Rate 48 (*)    All other components within normal limits   ____________________________________________   EKG    ____________________________________________  RADIOLOGY  Dg Foot Complete Right  Result Date:  04/05/2017 CLINICAL DATA:  Postoperative pain in the medial right foot. EXAM: RIGHT FOOT COMPLETE - 3+ VIEW COMPARISON:  06/03/2013 FINDINGS: Interval postoperative changes consistent with clubfoot repair. There is arthrodesis of the subtalar joints with screw and rod fixations of the tibia to the calcaneus. Anterior tibial calcaneal screw tip appears to protrude through the base of the calcaneus. Plate and screw fixation of the talonavicular joint. Joint spacer at the calcaneal cuboidal joint. Screw fixations of the talonavicular and calcaneal navicular joints. Multiple skin clips. Diffuse degenerative changes throughout the remaining intertarsal and tarsometatarsal joints. Prominent bone spur on the talus consistent with a talar beak. No evidence of acute fracture or dislocation. No bone erosions. Prominent plantar calcaneal spur. Soft tissue swelling. Linear wire leads in the soft tissues posterior and medial to the distal tibia. IMPRESSION: Postoperative changes in the right foot as described. Degenerative changes in the intertarsal and tarsometatarsal joints. No acute bony abnormalities. Electronically Signed   By: Lucienne Capers M.D.   On: 04/05/2017 21:52    ____________________________________________   PROCEDURES Procedures  ____________________________________________   INITIAL IMPRESSION / ASSESSMENT AND PLAN / ED COURSE  Pertinent labs & imaging results that were available during my care of the patient were reviewed by me and considered in my medical decision making (see chart for details).     patient presents with right foot pain, subacute to chronic. No frank signs of infection. Initial labs with white blood cell count and chemistry panel unremarkable. I'll add on an ESR and get a foot x-ray to evaluate for any evidence of evolving soft tissue infection or osteomyelitis.. She has great follow-up in place, so if there are no acute findings plan to defer any interventions until she  can see her surgeon.  Clinical Course as of Apr 06 2255  Sat Apr 05, 2017  2135 Unremarkable.  Not indicative of increased risk of infection in context of patient's age.  Sed Rate: (!) 48 [PS]    Clinical Course User Index [PS] Carrie Mew, MD     ----------------------------------------- 10:56 PM on 04/05/2017 -----------------------------------------  X-ray unremarkable. We'll redress the wound with sterile gauze, follow up with her surgeons this week.  ____________________________________________   FINAL CLINICAL IMPRESSION(S) / ED DIAGNOSES  Final diagnoses:  Right foot pain  Delayed surgical wound healing, initial encounter      New Prescriptions   No medications on file     Portions of this note were generated with dragon dictation software. Dictation errors may occur despite best attempts at proofreading.    Carrie Mew, MD 04/05/17 2256

## 2017-05-15 ENCOUNTER — Telehealth: Payer: Self-pay | Admitting: Family Medicine

## 2017-05-15 NOTE — Telephone Encounter (Signed)
[facility] and informed her that she would need to get home health orders from patient's  foot surgeon.

## 2017-05-15 NOTE — Telephone Encounter (Signed)
Copied from CRM [id number]. Topic: Quick Communication - See Telephone Encounter >> May 15, 2017  9:59 AM Cipriano BunkerLambe, Annette S wrote: CRM for notification. See Telephone encounter for:  Marcelino DusterMichelle from [facility] calling wanting verbal Order to Social Work Follow up to assist patient in getting private aid for home maintenance..  Please call Marcelino DusterMichelle 05/15/17.

## 2017-05-19 ENCOUNTER — Telehealth: Payer: Self-pay

## 2017-05-19 NOTE — Telephone Encounter (Signed)
I recommend STOPPING the caffeine Appointment please for high blood pressure or to urgent care if she cannot get here I did not order social work or other therapy; I have not seen patient in quite some time to order anything at all

## 2017-05-19 NOTE — Telephone Encounter (Signed)
Copied from CRM [id number]. Topic: General - Other >> May 19, 2017 11:43 AM Cecelia ByarsGreen, Temeka L, RMA wrote: [facility]Leslie [facility] Dr. Sherie DonLada that pt had Elevated BP 160/60 @ 10:15 am and then at 11:20 am 162/92, pt also had a level 3 mild headache and no other signs or symptoms of neurological deficits, pt has been taking fast green coffee which is a weight loss cleanse, Verlon AuLeslie thinks maybe the caffeine is raising patient's BP. Pt is experiencing constant back pain, pt would like to know why social work order was denied it is recommended that she would benefit from it by [employer]lie Gains [facility], please contact [facility] at [phone]   [employer] DonnelleyLeslie Gains RN notified

## 2017-05-20 ENCOUNTER — Telehealth: Payer: Self-pay | Admitting: Family Medicine

## 2017-05-20 NOTE — Telephone Encounter (Signed)
Patient did not take advice from [facility] to make appt for BP, just wanted to make you aware

## 2017-05-20 NOTE — Telephone Encounter (Signed)
Copied from CRM [id number]. Topic: Quick Communication - See Telephone Encounter >> May 20, 2017  8:47 AM Oneal GroutSebastian, Jennifer S wrote: CRM for notification. See Telephone encounter for:  05/20/17.

## 2017-05-26 ENCOUNTER — Ambulatory Visit (INDEPENDENT_AMBULATORY_CARE_PROVIDER_SITE_OTHER): Payer: Medicare PPO | Admitting: Family Medicine

## 2017-05-26 ENCOUNTER — Encounter: Payer: Self-pay | Admitting: Family Medicine

## 2017-05-26 DIAGNOSIS — F331 Major depressive disorder, recurrent, moderate: Secondary | ICD-10-CM

## 2017-05-26 DIAGNOSIS — Z599 Problem related to housing and economic circumstances, unspecified: Secondary | ICD-10-CM

## 2017-05-26 DIAGNOSIS — M5416 Radiculopathy, lumbar region: Secondary | ICD-10-CM | POA: Diagnosis not present

## 2017-05-26 DIAGNOSIS — Z789 Other specified health status: Secondary | ICD-10-CM

## 2017-05-26 DIAGNOSIS — Z598 Other problems related to housing and economic circumstances: Secondary | ICD-10-CM

## 2017-05-26 DIAGNOSIS — Z748 Other problems related to care provider dependency: Secondary | ICD-10-CM

## 2017-05-26 DIAGNOSIS — G8929 Other chronic pain: Secondary | ICD-10-CM

## 2017-05-26 MED ORDER — GABAPENTIN 400 MG PO CAPS: 400.0000 mg | ORAL_CAPSULE | Freq: Three times a day (TID) | ORAL | 2 refills | Status: DC

## 2017-05-26 MED ORDER — SERTRALINE HCL 100 MG PO TABS: 150.0000 mg | ORAL_TABLET | Freq: Every day | ORAL | 1 refills | Status: DC

## 2017-05-26 NOTE — Assessment & Plan Note (Signed)
Need to refer to back specialist to consider injections; xray today and refer for pain management

## 2017-05-26 NOTE — Assessment & Plan Note (Addendum)
S/p bariatric surgery; patient would like assistance being able to afford Belviq; will have social work assist with patient assistance program to see if we can get that for her for free

## 2017-05-26 NOTE — Assessment & Plan Note (Signed)
Supportive listening provided; resources provided including 24 hour crisis line; patient verbally contracted for safety, agrees to get immediate help if plan; close f/u; offered to send today to [facility], but they will not see her since she has a Medicare advantage plan; patient declined referral to psychiatry and psychology, saying she cannot afford those; I will increase her sertraline from 100 mg to 150 mg daily and see her back in 2 weeks; she agrees with plan

## 2017-05-26 NOTE — Progress Notes (Signed)
BP 136/78 (BP Location: Right Arm, Patient Position: Sitting, Cuff Size: Large)   Pulse 93   Temp 97.7 F (36.5 C) (Oral)   Ht 5\' 2"  (1.575 m)   Wt 222 lb 4.8 oz (100.8 kg)   SpO2 92%   BMI 40.66 kg/m    Subjective:    Patient ID: Andrea Holden, female    DOB: 08-Mar-1946, 71 y.o.   MRN: [id number]  HPI: Andrea Holden is a 71 y.o. female  Chief Complaint  Patient presents with  . Follow-up    HPI Patient is here for f/u  Since last visit, she has had foot surgery; post-op orders and therapy managed by surgeon; totally overwhelmed, suicidal thoughts; cannot afford her medicine, about to get evicted She has been seeing two surgeons, foot surgeon and plastic surgeon  First surgery was hospitalized for 5 days and then in rehab for 20 days; then she had to go back in and had surgery, Dr. Gwenevere Ghazi does not hurt and she hurts now in the back Defeated and tired and frustrated with weight, financially devastated; avoiding eviction Depending on the generosity of friends for transportation; using a walker Would like to see about other community resources Also needs help being able to afford  Belviq Has access to food  Her back hurts all the time; has almost made her fall a few times; has not actually fallen; saw a back specialist a few years ago, was told she has degenerative disc disease and arthritis; she is open to injections; not recovering as fast as she thinks; radiates down the left buttock into the leg; was on gabapentin TID before, but it was dropped to BID in the hospital She can't hardly breathe from the pain in her back, lower back and left side; laying in the hospital bed non-weight bearing for so long  She has high blood pressure; she takes lisinopril and metoprolol (but it turns out they stopped her metoprolol); no labs for creatinine or K+ since September 2017 here, but she had a BMP done in Epic, showing K+ of 5.1, creatinine of 0.98; that BMP showed a  glucose of 135, prior glucose readings were 133 and 184  She has morbid obesity; weight has fluctuated over the last 3 notations in Epic; needs help affording Belviq she says; she has had bariatric surgery in the past  She has GERD and has ranitidine listed in her medicine list; pantoprazole was a bottle she brought but she isn't taking that  She has allergies and has claritin just PRN and nasal corticosteroid (cannot afford) in her med list  Depression screen [facility] 2/9 05/26/2017 11/15/2016 10/04/2016 06/17/2016 02/28/2016  Decreased Interest 2 0 0 0 0  Down, Depressed, Hopeless 3 0 1 1 1   PHQ - 2 Score 5 0 1 1 1   Altered sleeping 2 - - - -  Tired, decreased energy 1 - - - -  Change in appetite 1 - - - -  Feeling bad or failure about yourself  1 - - - -  Trouble concentrating 0 - - - -  Moving slowly or fidgety/restless 0 - - - -  Suicidal thoughts 2 - - - -  PHQ-9 Score 12 - - - -  Difficult doing work/chores Very difficult - - - -  she denies any specific plan to harm herself; she does not have access to a gun; she is not considering overdose of pills; just tired and frustrated  Relevant past medical, surgical,  family and social history reviewed Past Medical History:  Diagnosis Date  . Allergy    seasonal allergies  . Anemia   . Anxiety   . Arthritis   . Asthma    allergy induced asthma  . Cataract   . Clotting disorder (HCC)    history of blood clots  . Complication of anesthesia    arrhythmia following colonoscopy  . Degenerative disc disease, lumbar   . Degenerative disc disease, lumbar   . Depression   . Diabetes mellitus   . Diabetic neuropathy (HCC) 11/16/2015  . Dysrhythmia   . GERD (gastroesophageal reflux disease)   . H/O eating disorder   . H/O transfusion    patient was given 5 units of blood [facility], blood type O+  . History of chicken pox   . History of hiatal hernia   . History of measles, mumps, or rubella   . HOH (hard of hearing)   .  Hyperlipidemia   . Hypertension   . Irregular heartbeat   . Moderate COPD (chronic obstructive pulmonary disease) (HCC) 12/14/2014  . Neuropathy   . Opiate use 11/16/2015  . Orthopnea   . Pes planus of both feet 11/16/2015  . Plantar fasciitis of right foot 11/16/2015  . Wheezing    Past Surgical History:  Procedure Laterality Date  . ABDOMINAL HYSTERECTOMY    . CATARACT EXTRACTION W/PHACO Right 05/30/2015   Procedure: CATARACT EXTRACTION PHACO AND INTRAOCULAR LENS PLACEMENT (IOC);  Surgeon: Galen ManilaWilliam Porfilio, MD;  Location: [facility] ORS;  Service: Ophthalmology;  Laterality: Right;  US 00:57 AP% 20.9 CDE 11.99 fluid pack lot #[id number]  . CHOLECYSTECTOMY    . COLON SURGERY     blocked colon  . FOOT FUSION Right 2018  . GALLBLADDER SURGERY    . GASTRIC BYPASS  2010  . internal bleeding  2016  . MOUTH SURGERY    . OVARY SURGERY    . SKIN GRAFT Right 2018   RT foot  . TENOTOMY ACHILLES TENDON     Percuntaneous  . TONSILLECTOMY     Family History  Problem Relation Age of Onset  . Asthma Mother   . COPD Mother   . Arthritis Father   . Depression Father   . Heart disease Father   . Hypertension Father   . Stroke Father   . Arthritis Brother   . Depression Brother   . Diabetes Brother   . Heart disease Brother   . Hyperlipidemia Brother   . Hypertension Brother   . Stroke Brother   . Vision loss Brother   . Diabetes Maternal Grandmother   . Thyroid disease Daughter   . Colitis Daughter   . Breast cancer Neg Hx    Social History   Tobacco Use  . Smoking status: Former Smoker    Years: 55.00    Types: Cigarettes    Start date: 07/01/1960    Last attempt to quit: 08/26/2016    Years since quitting: 0.7  . Smokeless tobacco: Never Used  Substance Use Topics  . Alcohol use: Yes    Alcohol/week: 0.0 oz    Comment: OCCASIONALLY  . Drug use: Yes    Types: Marijuana    Interim medical history since last visit reviewed. Allergies and medications reviewed  Review of  Systems Per HPI unless specifically indicated above     Objective:    BP 136/78 (BP Location: Right Arm, Patient Position: Sitting, Cuff Size: Large)   Pulse 93  Temp 97.7 F (36.5 C) (Oral)   Ht 5\' 2"  (1.575 m)   Wt 222 lb 4.8 oz (100.8 kg)   SpO2 92%   BMI 40.66 kg/m   Wt Readings from Last 3 Encounters:  05/26/17 222 lb 4.8 oz (100.8 kg)  04/05/17 206 lb (93.4 kg)  12/06/16 217 lb 4.8 oz (98.6 kg)    Physical Exam  Constitutional: She appears well-developed and well-nourished. No distress.  Morbidly obese  HENT:  Head: Normocephalic and atraumatic.  Eyes: EOM are normal. No scleral icterus.  Neck: No thyromegaly present.  Cardiovascular: Normal rate and regular rhythm.  Pulmonary/Chest: Effort normal and breath sounds normal. No respiratory distress. She has no wheezes.  Abdominal: Soft. Bowel sounds are normal. She exhibits no distension.  Musculoskeletal: Normal range of motion. She exhibits no edema.  Neurological: She is alert. She exhibits normal muscle tone.  Skin: Skin is warm and dry. She is not diaphoretic. No pallor.  Psychiatric: Her mood appears not anxious. Her affect is not blunt, not labile and not inappropriate. Her speech is not rapid and/or pressured and not delayed. She is not slowed, not withdrawn and not actively hallucinating. She exhibits a depressed mood. She expresses suicidal (passive; no plan) ideation. She expresses no homicidal ideation. She expresses no suicidal plans and no homicidal plans.  Good eye contact with examiner; briefly tearful;    Results for orders placed or performed during the hospital encounter of 04/05/17  CBC with Differential  Result Value Ref Range   WBC 7.7 3.6 - 11.0 K/uL   RBC 4.15 3.80 - 5.20 MIL/uL   Hemoglobin 10.9 (L) 12.0 - 16.0 g/dL   HCT 16.133.1 (L) 09.635.0 - 04.547.0 %   MCV 79.8 (L) 80.0 - 100.0 fL   MCH 26.4 26.0 - 34.0 pg   MCHC 33.1 32.0 - 36.0 g/dL   RDW 40.915.4 (H) 81.111.5 - 91.414.5 %   Platelets 305 150 - 440 K/uL    Neutrophils Relative % 72 %   Neutro Abs 5.5 1.4 - 6.5 K/uL   Lymphocytes Relative 17 %   Lymphs Abs 1.3 1.0 - 3.6 K/uL   Monocytes Relative 8 %   Monocytes Absolute 0.6 0.2 - 0.9 K/uL   Eosinophils Relative 2 %   Eosinophils Absolute 0.2 0 - 0.7 K/uL   Basophils Relative 1 %   Basophils Absolute 0.1 0 - 0.1 K/uL  Basic metabolic panel  Result Value Ref Range   Sodium 136 135 - 145 mmol/L   Potassium 5.1 3.5 - 5.1 mmol/L   Chloride 102 101 - 111 mmol/L   CO2 25 22 - 32 mmol/L   Glucose, Bld 135 (H) 65 - 99 mg/dL   BUN 23 (H) 6 - 20 mg/dL   Creatinine, Ser 7.820.98 0.44 - 1.00 mg/dL   Calcium 9.3 8.9 - 95.610.3 mg/dL   GFR calc non Af Amer 57 (L) >60 mL/min   GFR calc Af Amer >60 >60 mL/min   Anion gap 9 5 - 15  Sedimentation rate  Result Value Ref Range   Sed Rate 48 (H) 0 - 30 mm/hr      Assessment & Plan:   Problem List Items Addressed This Visit      Other   Needs assistance with community resources    Refer to social work      Relevant Orders   Ambulatory referral to Social Work   Morbid obesity [facility](HCC)    S/p bariatric surgery; patient would like assistance  being able to afford Belviq; will have social work assist with patient assistance program to see if we can get that for her for free      Moderate recurrent major depression (HCC)    Supportive listening provided; resources provided including 24 hour crisis line; patient verbally contracted for safety, agrees to get immediate help if plan; close f/u; offered to send today to [facility], but they will not see her since she has a Medicare advantage plan; patient declined referral to psychiatry and psychology, saying she cannot afford those; I will increase her sertraline from 100 mg to 150 mg daily and see her back in 2 weeks; she agrees with plan      Relevant Medications   sertraline (ZOLOFT) 100 MG tablet   Financial difficulties    Refer to social work      Relevant Orders   Ambulatory referral to Social Work   Chronic  radicular lumbar pain    Need to refer to back specialist to consider injections; xray today and refer for pain management      Relevant Orders   DG Lumbar Spine Complete   Ambulatory referral to Orthopedic Surgery   Assistance needed with transportation    Refer to social work      Relevant Orders   Ambulatory referral to Social Work      Follow up plan: Return in about 2 weeks (around 06/09/2017) for follow-up visit with Dr. Sherie Don.  An after-visit summary was printed and given to the patient at check-out.  Please see the patient instructions which may contain other information and recommendations beyond what is mentioned above in the assessment and plan.  Meds ordered this encounter  Medications  . gabapentin (NEURONTIN) 400 MG capsule    Sig: Take 1 capsule (400 mg total) by mouth 3 (three) times daily.    Dispense:  90 capsule    Refill:  2  . sertraline (ZOLOFT) 100 MG tablet    Sig: Take 1.5 tablets (150 mg total) by mouth daily.    Dispense:  135 tablet    Refill:  1    Orders Placed This Encounter  Procedures  . DG Lumbar Spine Complete  . Ambulatory referral to Social Work  . Ambulatory referral to Orthopedic Surgery

## 2017-05-26 NOTE — Assessment & Plan Note (Signed)
Refer to social work 

## 2017-05-26 NOTE — Patient Instructions (Addendum)
I've put in the referral to social work and they should be contacting you very soon Increase the sertraline from 100 mg daily to 150 mg daily  Here are some resources to help you if you feel you are in a mental health crisis:  National Suicide Prevention Lifeline - Call (351)486-52911-713-010-5202  for help - Website with more resources: ARanked.fihttps://suicidepreventionlifeline.org/  [facility] Crisis [facility] - Call 260-265-3287(804)498-4938 for help. - Mobile Crisis Program available 24 hours a day, 365 days a year. - Available for anyone of any age in [place] counties.  [facility] -- only for [employer] and [employer] - Address: [address], [place] - Telephone: [phone]  - Hours of Operation: Sunday - Saturday - 8:00 a.m. - 8:00 p.m. - Medicaid, Medicare (Government Issued Only), BCBS, and [employer] - Pay - Crisis Management, Outpatient Individual & Group Therapy, Psychiatrists on-site to provide medication management, In-Home Psychiatric Care, and Peer Support Care.  [facility] - Call [phone] for help. - Mobile Crisis Program available 24 hours a day, 365 days a year. - Available for anyone of any age in [place]    Try to follow the DASH guidelines (DASH stands for Dietary Approaches to Stop Hypertension). Try to limit the sodium in your diet to no more than 1,500mg  of sodium per day. Certainly try to not exceed 2,000 mg per day at the very most. Do not add salt when cooking or at the table.  Check the sodium amount on labels when shopping, and choose items lower in sodium when given a choice. Avoid or limit foods that already contain a lot of sodium. Eat a diet rich in fruits and vegetables and whole grains, and try to lose weight if overweight or obese  Check out the information at familydoctor.org entitled "Nutrition for Weight Loss: What You Need to Know about Fad Diets" Try to lose between 1-2  pounds per week by taking in fewer calories and burning off more calories You can succeed by limiting portions, limiting foods dense in calories and fat, becoming more active, and drinking 8 glasses of water a day (64 ounces) Don't skip meals, especially breakfast, as skipping meals may alter your metabolism Do not use over-the-counter weight loss pills or gimmicks that claim rapid weight loss A healthy BMI (or body mass index) is between 18.5 and 24.9 You can calculate your ideal BMI at the NIH website JobEconomics.huhttp://www.nhlbi.nih.gov/health/educational/lose_wt/BMI/bmicalc.htm   DASH Eating Plan DASH stands for "Dietary Approaches to Stop Hypertension." The DASH eating plan is a healthy eating plan that has been shown to reduce high blood pressure (hypertension). It may also reduce your risk for type 2 diabetes, heart disease, and stroke. The DASH eating plan may also help with weight loss. What are tips for following this plan? General guidelines  Avoid eating more than 2,300 mg (milligrams) of salt (sodium) a day. If you have hypertension, you may need to reduce your sodium intake to 1,500 mg a day.  Limit alcohol intake to no more than 1 drink a day for nonpregnant women and 2 drinks a day for men. One drink equals 12 oz of beer, 5 oz of wine, or 1 oz of hard liquor.  Work with your health care provider to maintain a healthy body weight or to lose weight. Ask what an ideal weight is for you.  Get at least 30 minutes of exercise that causes your heart to beat faster (aerobic exercise) most days  of the week. Activities may include walking, swimming, or biking.  Work with your health care provider or diet and nutrition specialist (dietitian) to adjust your eating plan to your individual calorie needs. Reading food labels  Check food labels for the amount of sodium per serving. Choose foods with less than 5 percent of the Daily Value of sodium. Generally, foods with less than 300 mg of sodium per  serving fit into this eating plan.  To find whole grains, look for the word "whole" as the first word in the ingredient list. Shopping  Buy products labeled as "low-sodium" or "no salt added."  Buy fresh foods. Avoid canned foods and premade or frozen meals. Cooking  Avoid adding salt when cooking. Use salt-free seasonings or herbs instead of table salt or sea salt. Check with your health care provider or pharmacist before using salt substitutes.  Do not fry foods. Cook foods using healthy methods such as baking, boiling, grilling, and broiling instead.  Cook with heart-healthy oils, such as olive, canola, soybean, or sunflower oil. Meal planning   Eat a balanced diet that includes: ? 5 or more servings of fruits and vegetables each day. At each meal, try to fill half of your plate with fruits and vegetables. ? Up to 6-8 servings of whole grains each day. ? Less than 6 oz of lean meat, poultry, or fish each day. A 3-oz serving of meat is about the same size as a deck of cards. One egg equals 1 oz. ? 2 servings of low-fat dairy each day. ? A serving of nuts, seeds, or beans 5 times each week. ? Heart-healthy fats. Healthy fats called Omega-3 fatty acids are found in foods such as flaxseeds and coldwater fish, like sardines, salmon, and mackerel.  Limit how much you eat of the following: ? Canned or prepackaged foods. ? Food that is high in trans fat, such as fried foods. ? Food that is high in saturated fat, such as fatty meat. ? Sweets, desserts, sugary drinks, and other foods with added sugar. ? Full-fat dairy products.  Do not salt foods before eating.  Try to eat at least 2 vegetarian meals each week.  Eat more home-cooked food and less restaurant, buffet, and fast food.  When eating at a restaurant, ask that your food be prepared with less salt or no salt, if possible. What foods are recommended? The items listed may not be a complete list. Talk with your dietitian about  what dietary choices are best for you. Grains Whole-grain or whole-wheat bread. Whole-grain or whole-wheat pasta. Brown rice. Orpah Cobbatmeal. Quinoa. Bulgur. Whole-grain and low-sodium cereals. Pita bread. Low-fat, low-sodium crackers. Whole-wheat flour tortillas. Vegetables Fresh or frozen vegetables (raw, steamed, roasted, or grilled). Low-sodium or reduced-sodium tomato and vegetable juice. Low-sodium or reduced-sodium tomato sauce and tomato paste. Low-sodium or reduced-sodium canned vegetables. Fruits All fresh, dried, or frozen fruit. Canned fruit in natural juice (without added sugar). Meat and other protein foods Skinless chicken or [place]. Ground chicken or [place]. Pork with fat trimmed off. Fish and seafood. Egg whites. Dried beans, peas, or lentils. Unsalted nuts, nut butters, and seeds. Unsalted canned beans. Lean cuts of beef with fat trimmed off. Low-sodium, lean deli meat. Dairy Low-fat (1%) or fat-free (skim) milk. Fat-free, low-fat, or reduced-fat cheeses. Nonfat, low-sodium ricotta or cottage cheese. Low-fat or nonfat yogurt. Low-fat, low-sodium cheese. Fats and oils Soft margarine without trans fats. Vegetable oil. Low-fat, reduced-fat, or light mayonnaise and salad dressings (reduced-sodium). Canola, safflower, olive, soybean, and  sunflower oils. Avocado. Seasoning and other foods Herbs. Spices. Seasoning mixes without salt. Unsalted popcorn and pretzels. Fat-free sweets. What foods are not recommended? The items listed may not be a complete list. Talk with your dietitian about what dietary choices are best for you. Grains Baked goods made with fat, such as croissants, muffins, or some breads. Dry pasta or rice meal packs. Vegetables Creamed or fried vegetables. Vegetables in a cheese sauce. Regular canned vegetables (not low-sodium or reduced-sodium). Regular canned tomato sauce and paste (not low-sodium or reduced-sodium). Regular tomato and vegetable juice (not low-sodium or  reduced-sodium). Rosita Fire. Olives. Fruits Canned fruit in a light or heavy syrup. Fried fruit. Fruit in cream or butter sauce. Meat and other protein foods Fatty cuts of meat. Ribs. Fried meat. Tomasa Blase. Sausage. Bologna and other processed lunch meats. Salami. Fatback. Hotdogs. Bratwurst. Salted nuts and seeds. Canned beans with added salt. Canned or smoked fish. Whole eggs or egg yolks. Chicken or [place] with skin. Dairy Whole or 2% milk, cream, and half-and-half. Whole or full-fat cream cheese. Whole-fat or sweetened yogurt. Full-fat cheese. Nondairy creamers. Whipped toppings. Processed cheese and cheese spreads. Fats and oils Butter. Stick margarine. Lard. Shortening. Ghee. Bacon fat. Tropical oils, such as coconut, palm kernel, or palm oil. Seasoning and other foods Salted popcorn and pretzels. Onion salt, garlic salt, seasoned salt, table salt, and sea salt. Worcestershire sauce. Tartar sauce. Barbecue sauce. Teriyaki sauce. Soy sauce, including reduced-sodium. Steak sauce. Canned and packaged gravies. Fish sauce. Oyster sauce. Cocktail sauce. Horseradish that you find on the shelf. Ketchup. Mustard. Meat flavorings and tenderizers. Bouillon cubes. Hot sauce and Tabasco sauce. Premade or packaged marinades. Premade or packaged taco seasonings. Relishes. Regular salad dressings. Where to find more information:  National Heart, Lung, and Blood Institute: PopSteam.is  American Heart Association: www.heart.org Summary  The DASH eating plan is a healthy eating plan that has been shown to reduce high blood pressure (hypertension). It may also reduce your risk for type 2 diabetes, heart disease, and stroke.  With the DASH eating plan, you should limit salt (sodium) intake to 2,300 mg a day. If you have hypertension, you may need to reduce your sodium intake to 1,500 mg a day.  When on the DASH eating plan, aim to eat more fresh fruits and vegetables, whole grains, lean proteins, low-fat  dairy, and heart-healthy fats.  Work with your health care provider or diet and nutrition specialist (dietitian) to adjust your eating plan to your individual calorie needs. This information is not intended to replace advice given to you by your health care provider. Make sure you discuss any questions you have with your health care provider. Document Released: 06/06/2011 Document Revised: 06/10/2016 Document Reviewed: 06/10/2016 Elsevier Interactive Patient Education  2017 [employer].

## 2017-05-26 NOTE — Assessment & Plan Note (Signed)
Refer to social work

## 2017-05-27 ENCOUNTER — Telehealth: Payer: Self-pay | Admitting: Family Medicine

## 2017-05-27 NOTE — Telephone Encounter (Signed)
Roxanne Gatesalled Leslie, RN with [facility]. No answer. Left detailed message informing her of Dr.Lada's message below. Advised nurse to call back with any questions or concerns. CRM created.

## 2017-05-27 NOTE — Telephone Encounter (Signed)
I'd like to have her surgeon(s) do the PT and OT (I don't know how she's progressing or what she needs in that regard), but I'll be glad to do the Child psychotherapistsocial worker and home health aid and skilled nursing

## 2017-05-27 NOTE — Telephone Encounter (Signed)
Copied from CRM [id number]. Topic: Quick Communication - See Telephone Encounter >> May 27, 2017  9:18 AM Diana EvesHoyt, Maryann B wrote: CRM for notification. See Telephone encounter for:  Verlon AuLeslie with [facility] calling she done a re certification for skilled nursing and would like an order to continue that for 3x a week for 5 weeks with one PRN visit. Also needing an order for a Child psychotherapistsocial worker. An order for recertification of PT and OT. Pt would also like an order for home health aid for 2 weeks. Leslie call back number [phone].  05/27/17.

## 2017-05-30 ENCOUNTER — Telehealth: Payer: Self-pay | Admitting: Family Medicine

## 2017-05-30 MED ORDER — LORCASERIN HCL 10 MG PO TABS: 1.0000 | ORAL_TABLET | Freq: Two times a day (BID) | ORAL | 2 refills | Status: DC

## 2017-05-30 NOTE — Telephone Encounter (Signed)
Copied from CRM [id number]. Topic: Quick Communication - See Telephone Encounter >> May 30, 2017  9:50 AM Rudi CocoLathan, Tarron Krolak M, NT wrote: CRM for notification. See Telephone encounter for:   05/30/17. Pt. Called to leave message for Dr. Sherie DonLada and Debbe Bales[place]awa [place]  said that her medicaid will be extended for a year and the med. Belviq can be covered under that. Any questions please give pt. A call [phone]

## 2017-05-30 NOTE — Telephone Encounter (Signed)
Pt.notified

## 2017-05-30 NOTE — Addendum Note (Signed)
Addended by: Phung Kotas, Janit BernMELINDA P on: 05/30/2017 12:57 PM   Modules accepted: Orders

## 2017-05-30 NOTE — Telephone Encounter (Signed)
That is good news I've written the Belviq and you can fax it to her local pharmacy (not mail order) I'd like to see 6 weeks after she starts this medicine (that will be in addition to the 06/09/17 appt)

## 2017-06-02 DIAGNOSIS — M51369 Other intervertebral disc degeneration, lumbar region without mention of lumbar back pain or lower extremity pain: Secondary | ICD-10-CM | POA: Insufficient documentation

## 2017-06-02 DIAGNOSIS — M5136 Other intervertebral disc degeneration, lumbar region: Secondary | ICD-10-CM | POA: Insufficient documentation

## 2017-06-04 ENCOUNTER — Telehealth: Payer: Self-pay

## 2017-06-04 NOTE — Telephone Encounter (Signed)
faxed

## 2017-06-04 NOTE — Telephone Encounter (Signed)
Copied from CRM [id number]. Topic: Quick Communication - See Telephone Encounter >> May 30, 2017  9:50 AM Rudi CocoLathan, Latoya M, NT wrote: CRM for notification. See Telephone encounter for:   05/30/17. Pt. Called to leave message for Dr. Sherie DonLada and Debbe B[place]  said that her medicaid will be extended for a year and the med. Belviq can be covered under that. Any questions please give pt. A call [phone] >> Jun 04, 2017  1:19 PM Cipriano BunkerLambe, Annette S wrote: Please call Misty StanleyLisa [facility] [phone] has questions and concerns.  Pt. Larey SeatFell and hurt back and went Ortho on Monday and had xray. There is a MRI scheduled for Monday right now.  Misty StanleyLisa is concerned as pt. Is out driving and running errands, with insurance, she is to be home bound. And is very hard to schedule appt. Around her schedule as she wants.  Please call Misty StanleyLisa first as she really wants to discuss this with either Dr. Sherie DonLada or her nurse

## 2017-06-04 NOTE — Telephone Encounter (Signed)
If she out and about, she is not home bound Homebound status is required for the assistance I've ordered, and not being homebound invalidates my orders to my understanding Verlon AuLeslie Gains is the home health nurse, so I called her; nurse goes to see her today around 5 pm  She fell on Monday at home; hit her back and saw orthopaedist, Altamese CabalMaurice Jones; he did xrays there; patient's recollection was that he could not tell if she had injuries related to her fall He has ordered an MRI and will likely need surgery on her back  I explained that I am canceling/invalidating my orders if she is not homebound as of now; I am aware of this as of 06/04/17 at about 3:30 pm  She missed home health visit on 11/28 and 12/4 because she wasn't there Only one left was Friday and they will cancel that  I reiterated that I will follow Medicare's guidelines and rules to the letter of the law, now that I am aware that she is no longer homebound If she is permitted one final visit or 24-48 hours grace period to wrap up her care or establish a discharge plan, whatever Medicare's rules allow is what home health should abide by; I cannot sign off on any additional visits or orders not permitted by Medicare  She was released to go into a shoe and was released to drive by Dr. Genelle GatherLevinson PT and OT orders are through the ortho so those are not my orders  Fax to [phone], secured fax

## 2017-06-05 ENCOUNTER — Other Ambulatory Visit: Payer: Self-pay | Admitting: Orthopedic Surgery

## 2017-06-05 ENCOUNTER — Telehealth: Payer: Self-pay | Admitting: Family Medicine

## 2017-06-05 DIAGNOSIS — M5136 Other intervertebral disc degeneration, lumbar region: Secondary | ICD-10-CM

## 2017-06-05 NOTE — Telephone Encounter (Signed)
Late entry; I spoke with home health nurse yesterday, gave discharge order for home health service OT/PT are not ordered by me so I did not cancel those If patient needs sooner appointment for follow-up, okay to put her on my schedule at opening or work in

## 2017-06-09 ENCOUNTER — Other Ambulatory Visit: Payer: Self-pay | Admitting: *Deleted

## 2017-06-09 ENCOUNTER — Ambulatory Visit: Payer: Medicare PPO | Admitting: Family Medicine

## 2017-06-09 NOTE — Patient Outreach (Signed)
[facility]) Care Management  06/09/2017  Andrea Holden 1946-04-24 [id number]  Telephone Screen  Referral Date: 06/06/17 Referral Source: MD office [name]([name], MD) Referral Reason: Financial struggles, find a plan for her to help afford Belviq, helplessness and needs access to appropriate services. Insurance: [employer] Medicare, Medicaid   Outreach attempt # 1 to patient. HIPAA identifiers verified. Patient explained her reason for needing assistance with Belviq. [facility] from 12/11/16 to 02/21/17 for a right foot reconstruction. Patient gained 40 pounds during her hospital stay due to her no weight bearing status. Patient stated, she had back complications prior to being in the hospital. Per patient, her back problems were enhanced due to lying in bed for a significant amount of time. Patient discussed having gastric bypass about 7 years ago and she lost 178 pounds. She described feeling depressed due to weight gain and having increased pain. Patient stated, she is waiting to have a MRI of her back. She was prescribed Belviq a year ago, which she believes helped her lose weight. Patient reported, she has a wound on her right foot. She is managing her dressing changes independently. She [facility] until patient homebound status changed. Patient is independent with transportation to medical appointments. She continues to ambulate with a cane, walker, and/or rollator. She verbalized having to require a significant amount of time with mobility. Patient continues to receive PT/OT [facility]. Patient has a scheduled primary MD on 06/12/17. [facility] services and benefits explained to patient. Patient agreed to services.  Plan: RN CM advised patient to contact RN CM for any needs or concerns. RN CM will send successful outreach letter to patient. RN CM will send EMMI educational materials.      Wynelle ClevelandJuanita  [name], RN, BSN, MHA/MSL, [facility] Telephonic Care Manager Coordinator [facility] Direct Phone: [phone] Toll Free: [phone] Fax: [phone]

## 2017-06-10 ENCOUNTER — Other Ambulatory Visit: Payer: Self-pay | Admitting: Orthopedic Surgery

## 2017-06-10 ENCOUNTER — Encounter: Payer: Self-pay | Admitting: *Deleted

## 2017-06-10 DIAGNOSIS — M5136 Other intervertebral disc degeneration, lumbar region: Secondary | ICD-10-CM

## 2017-06-11 ENCOUNTER — Telehealth: Payer: Self-pay | Admitting: Pharmacist

## 2017-06-11 NOTE — Patient Outreach (Signed)
[facility]) Care Management  06/11/2017  Andrea Holden [birth year] [id number]   71 year old female [facility] by MD office (Dr. Sherie DonLada) for assistance with transportation, community resources, and financial difficulties.  [facility] services requested for medication assistance with Belviq.  PMHx includes, but not limited to, morbid obesity, GERD, HTN, HLD, allergies, anxiety, asthma, COPD, history of blood clots, DDD, and diabetes mellitus with neuropathy.   Lorcaserin (Belviq) is a serotonin 5-HT2c receptor agonist indicated for weight loss.  It is manufactured by [employer] which has partnered with Rx Outreach to provide a patient assistance program which provides a 30-day supply of Belviq IR 10mg  BID for $25.  Patient must meet income requirement of annual income < $18,090.  If approved, medication will be mailed directly to patient's home.  Patient will be enrolled for 12 consecutive months (not per calendar year).    Unsuccessful telephone call to Andrea Holden today.  I left a HIPPA compliant voicemail on the mobile phone.  The home phone listed is not in service.   Plan: I will follow-up with Andrea Holden later this week regarding medication assistance with Belviq.    Haynes Hoehnolleen Bryen Hinderman, PharmD, [facility] Clinical Pharmacist [facility] [phone]

## 2017-06-12 ENCOUNTER — Ambulatory Visit: Payer: Self-pay | Admitting: Pharmacist

## 2017-06-12 ENCOUNTER — Encounter: Payer: Self-pay | Admitting: Family Medicine

## 2017-06-12 ENCOUNTER — Other Ambulatory Visit: Payer: Self-pay | Admitting: Pharmacist

## 2017-06-12 ENCOUNTER — Ambulatory Visit (INDEPENDENT_AMBULATORY_CARE_PROVIDER_SITE_OTHER): Payer: Medicare PPO | Admitting: Family Medicine

## 2017-06-12 DIAGNOSIS — M5416 Radiculopathy, lumbar region: Secondary | ICD-10-CM | POA: Diagnosis not present

## 2017-06-12 DIAGNOSIS — F331 Major depressive disorder, recurrent, moderate: Secondary | ICD-10-CM | POA: Diagnosis not present

## 2017-06-12 DIAGNOSIS — G8929 Other chronic pain: Secondary | ICD-10-CM

## 2017-06-12 DIAGNOSIS — I10 Essential (primary) hypertension: Secondary | ICD-10-CM

## 2017-06-12 DIAGNOSIS — M2550 Pain in unspecified joint: Secondary | ICD-10-CM | POA: Diagnosis not present

## 2017-06-12 DIAGNOSIS — Z5181 Encounter for therapeutic drug level monitoring: Secondary | ICD-10-CM

## 2017-06-12 DIAGNOSIS — E119 Type 2 diabetes mellitus without complications: Secondary | ICD-10-CM | POA: Diagnosis not present

## 2017-06-12 DIAGNOSIS — E781 Pure hyperglyceridemia: Secondary | ICD-10-CM

## 2017-06-12 NOTE — Assessment & Plan Note (Signed)
Refer to pain clinic? 

## 2017-06-12 NOTE — Assessment & Plan Note (Signed)
Avoid salt, processed foods

## 2017-06-12 NOTE — Progress Notes (Signed)
BP (!) 142/62   Pulse 77   Temp (!) 97.5 F (36.4 C) (Oral)   Resp 16   Wt 216 lb (98 kg)   SpO2 93%   BMI 39.51 kg/m    Subjective:    Patient ID: Andrea Holden Sweetser, female    DOB: May 24, 1946, 71 y.o.   MRN: [id number]  HPI: Andrea Holden Daily is a 71 y.o. female  Chief Complaint  Patient presents with  . Follow-up    HPI She is here for f/u of depression Says it is yes and no in terms of mood improvement, but overall sleeping better She would think about smoking again, then turned to food She goes back to sports rehab 06/25/17 She has her independence back and is driving again However, she has lost 6 pounds The struggle is with losing weight She has not gotten the Belviq yet She would like to go back on hydrocodone She sees Altamese CabalMaurice Jones, [facility]; they are not doing any pain management, he does not want to do anything until he sees the MRI; so much arthritis; xray report not available to me Pain is in the lower back Hips drop out; feels like she is going to fall immediately Did fall last Monday, on the deck and it had rained all day Sunday, leaves on the deck; mechanical fall, foot went out from under her Pain was already there even before the fall, nothing really changed in her back; hit her knee, forehead; nurse checked her over night Back pain started many years ago Treated previously with [facility], double nerve blocks, shots from doctor upstairs too; hydrocodone; had other oral pain medicine Cannot take excedrin or ibuprofen because of history of stomach ulcer  She has healing wounds on her feet  Type 2 diabetes; last A1c in the hospital  Depression screen [facility] 2/9 06/09/2017 05/26/2017 11/15/2016 10/04/2016 06/17/2016  Decreased Interest 0 2 0 0 0  Down, Depressed, Hopeless 1 3 0 1 1  PHQ - 2 Score 1 5 0 1 1  Altered sleeping - 2 - - -  Tired, decreased energy - 1 - - -  Change in appetite - 1 - - -  Feeling bad or failure about yourself   - 1 - - -  Trouble concentrating - 0 - - -  Moving slowly or fidgety/restless - 0 - - -  Suicidal thoughts - 2 - - -  PHQ-9 Score - 12 - - -  Difficult doing work/chores - Very difficult - - -    Relevant past medical, surgical, family and social history reviewed Past Medical History:  Diagnosis Date  . Allergy    seasonal allergies  . Anemia   . Anxiety   . Arthritis   . Asthma    allergy induced asthma  . Cataract   . Clotting disorder (HCC)    history of blood clots  . Complication of anesthesia    arrhythmia following colonoscopy  . Degenerative disc disease, lumbar   . Degenerative disc disease, lumbar   . Depression   . Diabetes mellitus   . Diabetic neuropathy (HCC) 11/16/2015  . Dysrhythmia   . GERD (gastroesophageal reflux disease)   . H/O eating disorder   . H/O transfusion    patient was [facility] 5 [facility], blood type O+  . History of chicken pox   . History of hiatal hernia   . History of measles, mumps, or rubella   .  HOH (hard of hearing)   . Hyperlipidemia   . Hypertension   . Irregular heartbeat   . Moderate COPD (chronic obstructive pulmonary disease) (HCC) 12/14/2014  . Neuropathy   . Opiate use 11/16/2015  . Orthopnea   . Pes planus of both feet 11/16/2015  . Plantar fasciitis of right foot 11/16/2015  . Wheezing    Past Surgical History:  Procedure Laterality Date  . ABDOMINAL HYSTERECTOMY    . CATARACT EXTRACTION W/PHACO Right 05/30/2015   Procedure: CATARACT EXTRACTION PHACO AND INTRAOCULAR LENS PLACEMENT (IOC);  Surgeon: Galen ManilaWilliam Porfilio, MD;  Location: [facility] ORS;  Service: Ophthalmology;  Laterality: Right;  US 00:57 AP% 20.9 CDE 11.99 fluid pack lot #[id number]  . CHOLECYSTECTOMY    . COLON SURGERY     blocked colon  . FOOT FUSION Right 2018  . GALLBLADDER SURGERY    . GASTRIC BYPASS  2010  . internal bleeding  2016  . MOUTH SURGERY    . OVARY SURGERY    . SKIN GRAFT Right 2018   RT foot  . TENOTOMY ACHILLES  TENDON     Percuntaneous  . TONSILLECTOMY     Family History  Problem Relation Age of Onset  . Asthma Mother   . COPD Mother   . Arthritis Father   . Depression Father   . Heart disease Father   . Hypertension Father   . Stroke Father   . Arthritis Brother   . Depression Brother   . Diabetes Brother   . Heart disease Brother   . Hyperlipidemia Brother   . Hypertension Brother   . Stroke Brother   . Vision loss Brother   . Diabetes Maternal Grandmother   . Thyroid disease Daughter   . Colitis Daughter   . Breast cancer Neg Hx    Social History   Tobacco Use  . Smoking status: Former Smoker    Years: 55.00    Types: Cigarettes    Start date: 07/01/1960    Last attempt to quit: 08/26/2016    Years since quitting: 0.8  . Smokeless tobacco: Never Used  Substance Use Topics  . Alcohol use: Yes    Alcohol/week: 0.0 oz    Comment: OCCASIONALLY  . Drug use: Yes    Types: Marijuana    Interim medical history since last visit reviewed. Allergies and medications reviewed  Review of Systems Per HPI unless specifically indicated above     Objective:    BP (!) 142/62   Pulse 77   Temp (!) 97.5 F (36.4 C) (Oral)   Resp 16   Wt 216 lb (98 kg)   SpO2 93%   BMI 39.51 kg/m   Wt Readings from Last 3 Encounters:  06/12/17 216 lb (98 kg)  05/26/17 222 lb 4.8 oz (100.8 kg)  04/05/17 206 lb (93.4 kg)    Physical Exam  Constitutional: She appears well-developed and well-nourished. No distress.  Morbidly obese  HENT:  Head: Normocephalic and atraumatic.  Eyes: EOM are normal. No scleral icterus.  Neck: No thyromegaly present.  Cardiovascular: Normal rate and regular rhythm.  Pulmonary/Chest: Effort normal and breath sounds normal. No respiratory distress. She has no wheezes.  Abdominal: Soft. Bowel sounds are normal. She exhibits no distension.  Musculoskeletal: Normal range of motion. She exhibits no edema.  Neurological: She is alert. She exhibits normal muscle  tone.  Skin: Skin is warm and dry. She is not diaphoretic. No pallor.  Psychiatric: Her mood appears not anxious. Her affect is  not blunt, not labile and not inappropriate. Her speech is not rapid and/or pressured and not delayed. She is not slowed. She does not exhibit a depressed mood. She expresses no homicidal and no suicidal (passive; no plan) ideation.  Good eye contact with examiner; not tearful; smiling and pleasant   Diabetic Foot Form - Detailed   Diabetic Foot Exam - detailed Diabetic Foot exam was performed with the following findings:  Yes 06/12/2017  2:55 PM  Visual Foot Exam completed.:  Yes  Right Dorsalis Pedis:  Present Left Dorsalis Pedis:  Present  Semmes-Weinstein Monofilament Test R Site 1-Great Toe:  Neg L Site 1-Great Toe:  Pos    Comments:  Right heel, tiny scab; no induration; lateral right foot slight yellowish drainage       Assessment & Plan:   Problem List Items Addressed This Visit      Cardiovascular and Mediastinum   Hypertension goal BP (blood pressure) < 140/90    Avoid salt, processed foods        Endocrine   Diabetes mellitus without complication (HCC)    Check A1c today; foot exam by MD      Relevant Orders   Microalbumin / creatinine urine ratio (Completed)   Lipid panel (Completed)   Hemoglobin A1c (Completed)     Other   Medication monitoring encounter    Check liver and kidney      Relevant Orders   COMPLETE METABOLIC PANEL WITH GFR (Completed)   High triglycerides    Check lipids      Relevant Orders   Lipid panel (Completed)   Chronic radicular lumbar pain    Refer to pain clinic; f/u with Altamese Cabal for MRI results      Relevant Orders   Ambulatory referral to Pain Clinic   Chronic pain of multiple joints    Refer to pain clinic      Relevant Orders   Ambulatory referral to Pain Clinic   Moderate recurrent major depression (HCC)    Patient's mood appears much improved with increase in SSRI; continue same            Follow up plan: Return in about 3 months (around 09/10/2017) for twenty minute follow-up with fasting labs.  An after-visit summary was printed and given to the patient at check-out.  Please see the patient instructions which may contain other information and recommendations beyond what is mentioned above in the assessment and plan.  No orders of the defined types were placed in this encounter.   Orders Placed This Encounter  Procedures  . Microalbumin / creatinine urine ratio  . Lipid panel  . Hemoglobin A1c  . COMPLETE METABOLIC PANEL WITH GFR  . Ambulatory referral to Pain Clinic    Right lateral foot; scant yellowish drainage Medially line healing well; bandage removed no drainage No induration Artery moved from left radial

## 2017-06-12 NOTE — Assessment & Plan Note (Signed)
Check lipids 

## 2017-06-12 NOTE — Patient Instructions (Addendum)
Please check with [facility] [address] to see if the Belviq is ready Try to increase your water intake to keep your urine really pale yellow Return 6 weeks after starting Belviq Let's get labs If you have not heard anything from my staff in a week about any orders/referrals/studies from today, please contact us here to follow-up [phone]

## 2017-06-12 NOTE — Assessment & Plan Note (Signed)
Check liver and kidney 

## 2017-06-12 NOTE — Assessment & Plan Note (Signed)
Check A1c today; foot exam by MD 

## 2017-06-12 NOTE — Assessment & Plan Note (Signed)
Refer to pain clinic; f/u with Altamese CabalMaurice Jones for MRI results

## 2017-06-12 NOTE — Patient Outreach (Signed)
[facility]) Care Management  06/12/2017  Andrea Holden 05/15/1946 [id number]   Unsuccessful telephone call #2 to Andrea Holden today.  I left a HIPPA compliant voicemail on the mobile phone.  The home number listed in no longer in service.    Plan: I will follow-up with patient next week regarding her medications unless I hear back beforehand.   Haynes Hoehnolleen Donnie Panik, PharmD, [facility] Clinical Pharmacist [facility] [phone]

## 2017-06-13 LAB — LIPID PANEL
Cholesterol: 185 mg/dL (ref <200)
HDL: 55 mg/dL (ref >50)
LDL Cholesterol (Calc): 103 mg/dL (calc) — ABNORMAL HIGH
Non-HDL Cholesterol (Calc): 130 mg/dL (calc) — ABNORMAL HIGH (ref <130)
Total CHOL/HDL Ratio: 3.4 (calc) (ref <5.0)
Triglycerides: 153 mg/dL — ABNORMAL HIGH (ref <150)

## 2017-06-13 LAB — COMPLETE METABOLIC PANEL WITH GFR
AG Ratio: 1.5 (calc) (ref 1.0–2.5)
ALT: 12 U/L (ref 6–29)
AST: 14 U/L (ref 10–35)
Albumin: 4.3 g/dL (ref 3.6–5.1)
Alkaline phosphatase (APISO): 82 U/L (ref 33–130)
BUN/Creatinine Ratio: 23 (calc) — ABNORMAL HIGH (ref 6–22)
BUN: 23 mg/dL (ref 7–25)
CO2: 27 mmol/L (ref 20–32)
Calcium: 9.5 mg/dL (ref 8.6–10.4)
Chloride: 103 mmol/L (ref 98–110)
Creat: 0.98 mg/dL — ABNORMAL HIGH (ref 0.60–0.93)
GFR, Est African American: 68 mL/min/{1.73_m2} (ref >=60)
GFR, Est Non African American: 58 mL/min/{1.73_m2} — ABNORMAL LOW (ref >=60)
Globulin: 2.8 g/dL (calc) (ref 1.9–3.7)
Glucose, Bld: 127 mg/dL — ABNORMAL HIGH (ref 65–99)
Potassium: 5.1 mmol/L (ref 3.5–5.3)
Sodium: 137 mmol/L (ref 135–146)
Total Bilirubin: 0.3 mg/dL (ref 0.2–1.2)
Total Protein: 7.1 g/dL (ref 6.1–8.1)

## 2017-06-13 LAB — MICROALBUMIN / CREATININE URINE RATIO
Creatinine, Urine: 96 mg/dL (ref 20–275)
Microalb Creat Ratio: 11 mcg/mg creat (ref <30)
Microalb, Ur: 1.1 mg/dL

## 2017-06-13 LAB — HEMOGLOBIN A1C
Hgb A1c MFr Bld: 6.5 % of total Hgb — ABNORMAL HIGH (ref <5.7)
Mean Plasma Glucose: 140 (calc)
eAG (mmol/L): 7.7 (calc)

## 2017-06-14 ENCOUNTER — Ambulatory Visit
Admission: RE | Admit: 2017-06-14 | Discharge: 2017-06-14 | Disposition: A | Discharge status: Home / Self Care | Payer: Medicare PPO | Source: Ambulatory Visit | Attending: Orthopedic Surgery | Admitting: Orthopedic Surgery

## 2017-06-14 ENCOUNTER — Ambulatory Visit: Payer: Medicare PPO

## 2017-06-14 DIAGNOSIS — M47816 Spondylosis without myelopathy or radiculopathy, lumbar region: Secondary | ICD-10-CM | POA: Diagnosis not present

## 2017-06-14 DIAGNOSIS — M5136 Other intervertebral disc degeneration, lumbar region: Secondary | ICD-10-CM | POA: Insufficient documentation

## 2017-06-14 DIAGNOSIS — M48061 Spinal stenosis, lumbar region without neurogenic claudication: Secondary | ICD-10-CM | POA: Diagnosis not present

## 2017-06-14 DIAGNOSIS — M2578 Osteophyte, vertebrae: Secondary | ICD-10-CM | POA: Insufficient documentation

## 2017-06-14 NOTE — Assessment & Plan Note (Signed)
Patient's mood appears much improved with increase in SSRI; continue same

## 2017-06-16 ENCOUNTER — Other Ambulatory Visit: Payer: Self-pay | Admitting: Pharmacist

## 2017-06-16 ENCOUNTER — Other Ambulatory Visit: Payer: Self-pay | Admitting: Family Medicine

## 2017-06-16 ENCOUNTER — Ambulatory Visit: Payer: Self-pay | Admitting: Pharmacist

## 2017-06-16 NOTE — Patient Outreach (Signed)
[facility]) Care Management  06/16/2017  Andrea Holden Jul Holden, Andrea Holden [id number]  Successful call placed to Ms. Malecki today.  HIPAA identifiers verified.   Ms. Andrea Holden is agreeable to discuss patient assistance for Belviq and have [facility] services.   Ms. Andrea Holden reports her annual income is ~$27,600.  She reports she currently has [facility] and is switching to [employer] [facility] in 2019.  She reports she previously took Belviq in spring of 2018 with successful results and paid ~$115/month for medication.  She reports she has been approved for [facility] [place] [facility] for another year.   [facility].  Per pharmacy staff, prescription requires a prior authorization which was faxed to provider, Dr. Sherie DonLada.  Patient picked up a 15 day supply of Belviq on 10/10/16 and paid $144.24 (insurance paid $9).  This was her only time filling prescription.   Based on estimated income from patient, she has Medicaid Part MQB which is not full Medicaid and therefore only pays Medicare premiums.   Call placed to [employer] to inquire about eligibility for cost-savings card which is available for patients with Medicare Part plans which do not cover Belviq.  Patient is not eligible however because she has a form of Medicaid even though this Medicaid does not cover prescriptions.   Patient not eligible for RxOutreach PAP based on her income.    I reviewed with patient that she can continue to try to have a prior authorization approved through [facility] however it is unlikely that this will be covered and there is not another cost savings option available though the manufacturer. GoodRX website may offer a small savings based on pharmacy.  Patient expressed understanding.  I provided my phone number if she needs to discuss any other medication concerns.   Plan: [facility] will sign-off patient case.  I will send MD a case closure letter and  alert [facility] CMA.   Haynes Hoehnolleen Zeriyah Wain, PharmD, [facility] Clinical Pharmacist [facility] [phone]

## 2017-06-16 NOTE — Telephone Encounter (Signed)
Belviq was already sent to pharmacy per my understanding Please let the patient know staff did the prior auth today Waiting on that Thank you

## 2017-06-16 NOTE — Telephone Encounter (Signed)
Metoprolol succinate was not on her medicine list Please confirm if she has been taking that all along and was on it at her last appointment Ranitidine not due until late January 2019 through mail order Rx for lisinopril approved

## 2017-06-16 NOTE — Telephone Encounter (Signed)
A prior Berkley Harveyauth has been done online today via CoverMyMeds.   Copied from CRM [id number]. Topic: Inquiry >> Jun 16, 2017  8:36 AM Crist InfanteHarrald, Kathy J wrote: Reason for CRM: pt states her pharmacy told her she needs a prior auth for Lorcaserin HCl (BELVIQ) 10 MG TABS Pt would like to pick this med up asap.  Pt states she  has had this med under her insurance before.  [facility] [address] - [place], [place] - [id number] [phone] (Phone) [phone] (Fax)

## 2017-06-18 ENCOUNTER — Other Ambulatory Visit: Payer: Self-pay | Admitting: Family Medicine

## 2017-06-18 DIAGNOSIS — M48061 Spinal stenosis, lumbar region without neurogenic claudication: Secondary | ICD-10-CM | POA: Insufficient documentation

## 2017-06-18 DIAGNOSIS — Z1231 Encounter for screening mammogram for malignant neoplasm of breast: Secondary | ICD-10-CM

## 2017-06-19 ENCOUNTER — Telehealth: Payer: Self-pay | Admitting: Family Medicine

## 2017-06-19 NOTE — Telephone Encounter (Signed)
Copied from CRM [id number]. Topic: Quick Communication - See Telephone Encounter >> Jun 19, 2017  4:49 PM Terisa Starraylor, Brittany L wrote: CRM for notification. See Telephone encounter for:   06/19/17.  Pt needs refill on her albuterol. [facility] [address].  Pt got a call today from her [facility] mail order, they stated that they are having trouble with the Motoprolol. She wants Dr Sherie DonLada to fax over to them that to drop the motorprolol bc she doesn't take that anymore. She said its an automative recording so there is no way for her to tell them that. Fax number is [phone]

## 2017-06-19 NOTE — Telephone Encounter (Signed)
Pt asking for Albuterol refill and states she does not want to take Metoprolol anymore.

## 2017-06-20 MED ORDER — ALBUTEROL SULFATE HFA 108 (90 BASE) MCG/ACT IN AERS: 2.0000 | INHALATION_SPRAY | Freq: Four times a day (QID) | RESPIRATORY_TRACT | 1 refills | Status: DC | PRN

## 2017-06-20 NOTE — Telephone Encounter (Signed)
Pt informed that [facility] and that according to notes of Dr. lada on 12/21, [facility] and Metoprolol stopped. Pt verbalized understanding.

## 2017-06-20 NOTE — Telephone Encounter (Signed)
Left detailed voicemail on cell phone

## 2017-06-20 NOTE — Telephone Encounter (Signed)
STOP metoprolol; please inform mail order I really dislike automatic refills Thank you

## 2017-06-25 ENCOUNTER — Ambulatory Visit: Payer: Medicare PPO | Admitting: Physical Therapy

## 2017-06-30 ENCOUNTER — Encounter: Payer: Medicare PPO | Admitting: Physical Therapy

## 2017-07-02 ENCOUNTER — Encounter: Payer: Medicare PPO | Admitting: Physical Therapy

## 2017-07-02 DIAGNOSIS — M545 Low back pain: Secondary | ICD-10-CM | POA: Diagnosis not present

## 2017-07-07 ENCOUNTER — Encounter: Payer: Medicare PPO | Admitting: Physical Therapy

## 2017-07-08 DIAGNOSIS — M47817 Spondylosis without myelopathy or radiculopathy, lumbosacral region: Secondary | ICD-10-CM | POA: Diagnosis not present

## 2017-07-09 ENCOUNTER — Encounter: Payer: Medicare PPO | Admitting: Physical Therapy

## 2017-07-14 ENCOUNTER — Encounter: Payer: Medicare PPO | Admitting: Physical Therapy

## 2017-07-16 ENCOUNTER — Encounter: Payer: Medicare PPO | Admitting: Physical Therapy

## 2017-07-18 ENCOUNTER — Ambulatory Visit
Admission: RE | Admit: 2017-07-18 | Discharge: 2017-07-18 | Disposition: A | Discharge status: Home / Self Care | Payer: PPO | Source: Ambulatory Visit | Attending: Family Medicine | Admitting: Family Medicine

## 2017-07-18 DIAGNOSIS — Z1231 Encounter for screening mammogram for malignant neoplasm of breast: Secondary | ICD-10-CM | POA: Diagnosis not present

## 2017-07-21 ENCOUNTER — Encounter: Payer: Self-pay | Admitting: Physical Therapy

## 2017-07-21 ENCOUNTER — Ambulatory Visit: Payer: PPO | Attending: Physical Medicine and Rehabilitation | Admitting: Physical Therapy

## 2017-07-21 ENCOUNTER — Encounter: Payer: Medicare PPO | Admitting: Physical Therapy

## 2017-07-21 ENCOUNTER — Other Ambulatory Visit: Payer: Self-pay

## 2017-07-21 DIAGNOSIS — M5440 Lumbago with sciatica, unspecified side: Secondary | ICD-10-CM | POA: Insufficient documentation

## 2017-07-21 DIAGNOSIS — M6281 Muscle weakness (generalized): Secondary | ICD-10-CM | POA: Insufficient documentation

## 2017-07-21 DIAGNOSIS — M62838 Other muscle spasm: Secondary | ICD-10-CM | POA: Diagnosis not present

## 2017-07-21 DIAGNOSIS — G8929 Other chronic pain: Secondary | ICD-10-CM | POA: Diagnosis not present

## 2017-07-22 NOTE — Therapy (Signed)
[place] [facility] PH[facility] AND SPORTS MEDICINE [address] [place], [place], [address] Phone: [phone]   Fax:  [phone]  Physical Therapy Evaluation  Patient Details  Name: Andrea MangleDianna J Holden MRN: [id number] Date of Birth: [birth year] Referring Provider: Marene LenzKalman, Arthur DO   Encounter Date: 07/21/2017  PT End of Session - 07/21/17 1903    Visit Number  1    Number of Visits  12    Date for PT Re-Evaluation  09/01/17    PT Start Time  1809    PT Stop Time  1900    PT Time Calculation (min)  51 min    Activity Tolerance  Patient tolerated treatment well    Behavior During Therapy  [facility] assessed/performed       Past Medical History:  Diagnosis Date  . Allergy    seasonal allergies  . Anemia   . Anxiety   . Arthritis   . Asthma    allergy induced asthma  . Cataract   . Clotting disorder (HCC)    history of blood clots  . Complication of anesthesia    arrhythmia following colonoscopy  . Degenerative disc disease, lumbar   . Degenerative disc disease, lumbar   . Depression   . Diabetes mellitus   . Diabetic neuropathy (HCC) 11/16/2015  . Dysrhythmia   . GERD (gastroesophageal reflux disease)   . H/O eating disorder   . H/O transfusion    [facility], blood type O+  . History of chicken pox   . History of hiatal hernia   . History of measles, mumps, or rubella   . HOH (hard of hearing)   . Hyperlipidemia   . Hypertension   . Irregular heartbeat   . Moderate COPD (chronic obstructive pulmonary disease) (HCC) 12/14/2014  . Neuropathy   . Opiate use 11/16/2015  . Orthopnea   . Pes planus of both feet 11/16/2015  . Plantar fasciitis of right foot 11/16/2015  . Wheezing     Past Surgical History:  Procedure Laterality Date  . ABDOMINAL HYSTERECTOMY    . CATARACT EXTRACTION W/PHACO Right 05/30/2015   Procedure: CATARACT EXTRACTION PHACO AND INTRAOCULAR LENS PLACEMENT (IOC);   Surgeon: Galen ManilaWilliam Porfilio, MD;  Location: [facility] ORS;  Service: Ophthalmology;  Laterality: Right;  US 00:57 AP% 20.9 CDE 11.99 fluid pack lot #[id number]  . CHOLECYSTECTOMY    . COLON SURGERY     blocked colon  . FOOT FUSION Right 2018  . GALLBLADDER SURGERY    . GASTRIC BYPASS  2010  . internal bleeding  2016  . MOUTH SURGERY    . OVARY SURGERY    . SKIN GRAFT Right 2018   RT foot  . TENOTOMY ACHILLES TENDON     Percuntaneous  . TONSILLECTOMY      There were no vitals filed for this visit.   Subjective Assessment - 07/21/17 1831    Subjective  Patient reports she is having left sided lower back pain that radiates into buttocks and is worse with standing, sitting, lying, walking bending activities; also reports history of right ankle surgery with complications and she still has pain in right heel with standing    Pertinent History  chronic back pain and foot pain over many years since 1995 with previous physical therapy with good results. Patient reports falling 06/02/17 which caused increased back pain and she is now waing to have evaluation by spine specialist  Limitations  Sitting;Standing;Walking;House hold activities    How long can you sit comfortably?  <1 hour    How long can you stand comfortably?  <20 min    How long can you walk comfortably?  only short distances    Patient Stated Goals  Patient wants to be abe to walk more normally with less pain in back to return to normal activities     Currently in Pain?  Yes    Pain Score  7     Pain Location  Back    Pain Orientation  Left;Posterior    Pain Descriptors / Indicators  Aching    Pain Type  Chronic pain    Pain Onset  More than a month ago    Pain Frequency  Intermittent better following recent injection 2 weeks ago    Aggravating Factors   sitting, standing, lying, walking, bending    Effect of Pain on Daily Activities  difficulty with all daily tasks involving standing, walking, sitting         [facility] PT  Assessment - 07/21/17 1827      Assessment   Medical Diagnosis  Low back pain M54.5    Referring Provider  Marene Lenz DO    Onset Date/Surgical Date  03/31/17    Hand Dominance  Right    Prior Therapy  yes for back pain in the past with good results      Precautions   Precautions  None      Balance Screen   Has the patient fallen in the past 6 months  No      Home Environment   Living Environment  Private residence    Living Arrangements  Alone    Type of Home  House    Home Access  Stairs to enter    Entrance Stairs-Number of Steps  3    Entrance Stairs-Rails  Right;Left;Can reach both    Home Layout  One level      Prior Function   Level of Independence  Independent    Vocation  Part time employment    Vocation Requirements  substitute teaching      Cognition   Overall Cognitive Status  Within Functional Limits for tasks assessed      Observation/Other Assessments   Modified [place]  55%      ROM / Strength   AROM / PROM / Strength  AROM;Strength      AROM   Overall AROM Comments  lumbar spine limited flexoin and extension by 50% with increased lower back symptoms, side bending decresaed to left 25%, right 50%      Strength   Overall Strength Comments  LE's decresed hip flexion bilaterally 4-/5, Knee extension, flexion WNL; ankle DF and great toe extension left WNL      Palpation   Palpation comment  incresaed tenderness along lower lumbar spine left>right and intor gluteus medius muscle; mils spasms noted along lower lumbar paraspinal muscles left side      Slump test   Findings  Unable to test patient unable to answer definitively whether pain is reproduced      Straight Leg Raise   Findings  Not tested      Ambulation/Gait   Gait Comments  antalgic         Objective measurements completed on examination: See above findings.      PT Education - 07/21/17 1905    Education provided  Yes    Education Details  home  program for posture with  sitting, tranfers in/out of bed, car, good body mechanics for daily tasks to protect back    Person(s) Educated  Patient    Methods  Explanation;Handout;Demonstration    Comprehension  Verbalized understanding          PT Long Term Goals - 07/21/17 1945      PT LONG TERM GOAL #1   Title  Patient will improve MODI score to 40% or better demonstrating improved function with daily tasks and able to stand, sit and walk with less difficulty    Baseline  MODI 55%     Status  New    Target Date  08/11/17      PT LONG TERM GOAL #2   Title  Patient will ben independent with home program for pain control and progressive exercises for flexibility and strength in lumbar spine/right LE  in order to transition to home program     Baseline  limited knowldge of appropriate exercises and progression without cuing, instruction    Status  New    Target Date  09/01/17      PT LONG TERM GOAL #3   Title  Patient will improve MODI score to 30% or better demonstrating improved function with daily tasks and able to stand, sit and walk with less difficulty    Baseline  MODI 55%    Status  New    Target Date  09/01/17             Plan - 07/21/17 1930    Clinical Impression Statement  Patieint is a 72 year old female who presents with chronic pain with exacerbation of symptoms following fall 05/2017. She has limitations with daily activities involving standing, walking , bending. She has spasms in back that limits activity. She will benefit from physical therapy intervention to address pain, spasm and progress exercises in order to improve function.     History and Personal Factors relevant to plan of care:  chronic back pain and foot pain over many years since 1995 with previous physical therapy with good results. Patient reports falling 06/02/17 which caused increased back pain and she is now waiting to have evaluation by spine specialist    Clinical Presentation  Evolving    Clinical Presentation due  to:  pain radiating into left buttock/LE    Clinical Decision Making  Moderate    Rehab Potential  Fair    Clinical Impairments Affecting Rehab Potential  (+) motivated (-) muliple co morbidities, chronic condition    PT Frequency  2x / week    PT Duration  6 weeks    PT Treatment/Interventions  Electrical Stimulation;Moist Heat;Ultrasound;Patient/family education;Neuromuscular re-education;Therapeutic exercise;Manual techniques    PT Next Visit Plan  pain control, ther. ex, manual therapy    PT Home Exercise Plan  stabilization exercises; observe good body mechanics for daily tasks    Consulted and Agree with Plan of Care  Patient       Patient will benefit from skilled therapeutic intervention in order to improve the following deficits and impairments:  Decreased strength, Impaired flexibility, Decreased activity tolerance, Impaired perceived functional ability, Pain, Decreased endurance, Difficulty walking  Visit Diagnosis: Other muscle spasm - Plan: PT plan of care cert/re-cert  Chronic bilateral low back pain with sciatica, sciatica laterality unspecified - Plan: PT plan of care cert/re-cert  Muscle weakness (generalized) - Plan: PT plan of care cert/re-cert     Problem List Patient Active Problem List   Diagnosis Date  Noted  . Assistance needed with transportation 05/26/2017  . Financial difficulties 05/26/2017  . Needs assistance with community resources 05/26/2017  . Moderate recurrent major depression (HCC) 05/26/2017  . Controlled substance agreement broken 10/11/2016  . Breast cancer screening 06/18/2016  . Knee pain 04/03/2016  . Hyponatremia 03/01/2016  . High triglycerides 12/24/2015  . Pes planus of both feet 11/16/2015  . Plantar fasciitis of right foot 11/16/2015  . Heel spur 11/16/2015  . Diabetic neuropathy (HCC) 11/16/2015  . Right ankle pain 11/16/2015  . Medication monitoring encounter 11/16/2015  . Diabetes mellitus without complication (HCC)  08/15/2015  . Need for Tdap vaccination 08/15/2015  . Chronic pain of multiple joints 08/15/2015  . Abnormal mammogram of right breast 06/19/2015  . Cerumen impaction 03/16/2015  . Hypertension goal BP (blood pressure) < 140/90 12/14/2014  . Hyperlipidemia LDL goal <100 12/14/2014  . History of transfusion of packed RBC 12/14/2014  . COPD, mild (HCC) 12/14/2014  . Hx of smoking 12/14/2014  . Status post bariatric surgery 12/14/2014  . Morbid obesity (HCC) 12/14/2014  . Chronic radicular lumbar pain 12/14/2014  . History of GI bleed 11/12/2014  . GERD without esophagitis 11/12/2014  . History of small bowel obstruction 09/07/2014    Beacher May PT 07/22/2017, 11:24 PM  [place] [facility] PHYSICAL AND SPORTS MEDICINE [address], [place], [address] Phone: [phone]   Fax:  [phone]  Name: PEARL BENTS MRN: [id number] Date of Birth: [birth year]

## 2017-07-23 ENCOUNTER — Encounter: Payer: Self-pay | Admitting: Physical Therapy

## 2017-07-23 ENCOUNTER — Encounter: Payer: Medicare PPO | Admitting: Physical Therapy

## 2017-07-23 ENCOUNTER — Ambulatory Visit: Payer: PPO | Admitting: Physical Therapy

## 2017-07-23 DIAGNOSIS — M62838 Other muscle spasm: Secondary | ICD-10-CM | POA: Diagnosis not present

## 2017-07-23 DIAGNOSIS — G8929 Other chronic pain: Secondary | ICD-10-CM

## 2017-07-23 DIAGNOSIS — M47817 Spondylosis without myelopathy or radiculopathy, lumbosacral region: Secondary | ICD-10-CM | POA: Diagnosis not present

## 2017-07-23 DIAGNOSIS — M6281 Muscle weakness (generalized): Secondary | ICD-10-CM

## 2017-07-23 DIAGNOSIS — M5440 Lumbago with sciatica, unspecified side: Secondary | ICD-10-CM

## 2017-07-23 NOTE — Therapy (Signed)
[place] [facility] [address] [place], [place], [address] Phone: [phone]   Fax:  [phone]  Physical Therapy Treatment  Patient Details  Name: Andrea Holden MRN: [id number] Date of Birth: August 08, 1945 Referring Provider: Marene LenzKalman, Arthur DO   Encounter Date: 07/23/2017  PT End of Session - 07/23/17 1610    Visit Number  2    Number of Visits  12    Date for PT Re-Evaluation  09/01/17    PT Start Time  1605    PT Stop Time  1640    PT Time Calculation (min)  35 min    Activity Tolerance  Patient tolerated treatment well    Behavior During Therapy  [facility] for tasks assessed/performed       Past Medical History:  Diagnosis Date  . Allergy    seasonal allergies  . Anemia   . Anxiety   . Arthritis   . Asthma    allergy induced asthma  . Cataract   . Clotting disorder (HCC)    history of blood clots  . Complication of anesthesia    arrhythmia following colonoscopy  . Degenerative disc disease, lumbar   . Degenerative disc disease, lumbar   . Depression   . Diabetes mellitus   . Diabetic neuropathy (HCC) 11/16/2015  . Dysrhythmia   . GERD (gastroesophageal reflux disease)   . H/O eating disorder   . H/O transfusion    [facility]  . History of chicken pox   . History of hiatal hernia   . History of measles, mumps, or rubella   . HOH (hard of hearing)   . Hyperlipidemia   . Hypertension   . Irregular heartbeat   . Moderate COPD (chronic obstructive pulmonary disease) (HCC) 12/14/2014  . Neuropathy   . Opiate use 11/16/2015  . Orthopnea   . Pes planus of both feet 11/16/2015  . Plantar fasciitis of right foot 11/16/2015  . Wheezing     Past Surgical History:  Procedure Laterality Date  . ABDOMINAL HYSTERECTOMY    . CATARACT EXTRACTION W/PHACO Right 05/30/2015   Procedure: CATARACT EXTRACTION PHACO AND INTRAOCULAR LENS PLACEMENT (IOC);  Surgeon:  Galen ManilaWilliam Porfilio, MD;  Location: [facility] ORS;  Service: Ophthalmology;  Laterality: Right;  US 00:57 AP% 20.9 CDE 11.99 fluid pack lot #[id number]  . CHOLECYSTECTOMY    . COLON SURGERY     blocked colon  . FOOT FUSION Right 2018  . GALLBLADDER SURGERY    . GASTRIC BYPASS  2010  . internal bleeding  2016  . MOUTH SURGERY    . OVARY SURGERY    . SKIN GRAFT Right 2018   RT foot  . TENOTOMY ACHILLES TENDON     Percuntaneous  . TONSILLECTOMY      There were no vitals filed for this visit.  Subjective Assessment - 07/23/17 1608    Subjective  Patient reports she is having less spasms in left lower back    Pertinent History  chronic back pain and foot pain over many years since 1995 with previous physical therapy with good results. Patient reports falling 06/02/17 which caused increased back pain and she is now waing to have evaluation by spine specialist    Limitations  Sitting;Standing;Walking;House hold activities    How long can you sit comfortably?  <1 hour    How long can you stand comfortably?  <20 min  How long can you walk comfortably?  only short distances    Patient Stated Goals  Patient wants to be abe to walk more normally with less pain in back to return to normal activities     Currently in Pain?  Yes    Pain Score  5     Pain Location  Back    Pain Orientation  Left;Posterior    Pain Descriptors / Indicators  Aching    Pain Type  Chronic pain    Pain Onset  More than a month ago    Pain Frequency  Intermittent         Objective: Palpation: point tender bilateral mid to lower back with spasms along paraspinal muscles lower thoracic spine and bilateral gluteus medius region Gait; ambulating with SPC with base; wide BOS, decreased trunk rotation, decreased ankle DF and push off bilaterally   Treatment:  Re assessed home exercises: scapular retraction at door frame with 1 pillow and a rolled up towel behind head; required repeated demonstration and VC to perform with  good technique Seated hip adduction with ball and gluteal setting  Manual therapy: 28 min goal: improve spasms and pain Seated in massage chair: performed STM superficial techniques thoracic to lumbar spine paraspinal muscles and upper gluteal muscles  Patient response to treatment: patient demonstrated improved technique with exercises with repeated demonstration and VC for correct alignment. Patient with decreased pain from 5/10 to 3/10. Patient with decreased spasms by 50% following STM. Improved motor control with exercises with repetition and cuing     PT Education - 07/23/17 1609    Education provided  Yes    Education Details  re assessed home program     Person(s) Educated  Patient    Methods  Explanation    Comprehension  Verbalized understanding          PT Long Term Goals - 07/21/17 1945      PT LONG TERM GOAL #1   Title  Patient will improve MODI score to 40% or better demonstrating improved function with daily tasks and able to stand, sit and walk with less difficulty    Baseline  MODI 55%     Status  New    Target Date  08/11/17      PT LONG TERM GOAL #2   Title  Patient will ben independent with home program for pain control and progressive exercises for flexibility and strength in lumbar spine/right LE  in order to transition to home program     Baseline  limited knowldge of appropriate exercises and progression without cuing, instruction    Status  New    Target Date  09/01/17      PT LONG TERM GOAL #3   Title  Patient will improve MODI score to 30% or better demonstrating improved function with daily tasks and able to stand, sit and walk with less difficulty    Baseline  MODI 55%    Status  New    Target Date  09/01/17            Plan - 07/23/17 1610    Clinical Impression Statement  Patient demonstrates improvement with spasms with current treatment. She is responding well to manual therapy soft tissue techniques with decreasing spasms and pain.  She requires instruction and guidance to perform exercises for core control and strength and will benefit from additional physical therapy intervention to achieve goals.     Rehab Potential  Fair    Clinical  Impairments Affecting Rehab Potential  (+) motivated (-) muliple co morbidities, chronic condition    PT Frequency  2x / week    PT Duration  6 weeks    PT Treatment/Interventions  Electrical Stimulation;Moist Heat;Ultrasound;Patient/family education;Neuromuscular re-education;Therapeutic exercise;Manual techniques    PT Next Visit Plan  pain control, ther. ex, manual therapy    PT Home Exercise Plan  stabilization exercises; observe good body mechanics for daily tasks       Patient will benefit from skilled therapeutic intervention in order to improve the following deficits and impairments:  Decreased strength, Impaired flexibility, Decreased activity tolerance, Impaired perceived functional ability, Pain, Decreased endurance, Difficulty walking  Visit Diagnosis: Other muscle spasm  Chronic bilateral low back pain with sciatica, sciatica laterality unspecified  Muscle weakness (generalized)     Problem List Patient Active Problem List   Diagnosis Date Noted  . Assistance needed with transportation 05/26/2017  . Financial difficulties 05/26/2017  . Needs assistance with community resources 05/26/2017  . Moderate recurrent major depression (HCC) 05/26/2017  . Controlled substance agreement broken 10/11/2016  . Breast cancer screening 06/18/2016  . Knee pain 04/03/2016  . Hyponatremia 03/01/2016  . High triglycerides 12/24/2015  . Pes planus of both feet 11/16/2015  . Plantar fasciitis of right foot 11/16/2015  . Heel spur 11/16/2015  . Diabetic neuropathy (HCC) 11/16/2015  . Right ankle pain 11/16/2015  . Medication monitoring encounter 11/16/2015  . Diabetes mellitus without complication (HCC) 08/15/2015  . Need for Tdap vaccination 08/15/2015  . Chronic pain of  multiple joints 08/15/2015  . Abnormal mammogram of right breast 06/19/2015  . Cerumen impaction 03/16/2015  . Hypertension goal BP (blood pressure) < 140/90 12/14/2014  . Hyperlipidemia LDL goal <100 12/14/2014  . History of transfusion of packed RBC 12/14/2014  . COPD, mild (HCC) 12/14/2014  . Hx of smoking 12/14/2014  . Status post bariatric surgery 12/14/2014  . Morbid obesity (HCC) 12/14/2014  . Chronic radicular lumbar pain 12/14/2014  . History of GI bleed 11/12/2014  . GERD without esophagitis 11/12/2014  . History of small bowel obstruction 09/07/2014    Beacher May PT 07/24/2017, 6:02 PM  [place] [facility] PHYSICAL AND SPORTS MEDICINE [address], [place], [address] Phone: [phone]   Fax:  [phone]  Name: Andrea Holden MRN: [id number] Date of Birth: 24-Sep-1945

## 2017-07-24 DIAGNOSIS — M48061 Spinal stenosis, lumbar region without neurogenic claudication: Secondary | ICD-10-CM | POA: Diagnosis not present

## 2017-07-28 ENCOUNTER — Encounter: Payer: Medicare PPO | Admitting: Physical Therapy

## 2017-07-30 ENCOUNTER — Encounter: Payer: Self-pay | Admitting: Physical Therapy

## 2017-07-30 ENCOUNTER — Encounter: Payer: Medicare PPO | Admitting: Physical Therapy

## 2017-07-30 ENCOUNTER — Ambulatory Visit: Payer: PPO | Admitting: Physical Therapy

## 2017-07-30 DIAGNOSIS — M5440 Lumbago with sciatica, unspecified side: Secondary | ICD-10-CM

## 2017-07-30 DIAGNOSIS — M62838 Other muscle spasm: Secondary | ICD-10-CM | POA: Diagnosis not present

## 2017-07-30 DIAGNOSIS — G8929 Other chronic pain: Secondary | ICD-10-CM

## 2017-07-30 DIAGNOSIS — M6281 Muscle weakness (generalized): Secondary | ICD-10-CM

## 2017-07-31 NOTE — Therapy (Signed)
[facility] MEDICINE [address], [place], [address] Phone: [phone]   Fax:  [phone]  Physical Therapy Treatment  Patient Details  Name: Andrea Holden MRN: [id number] Date of Birth: [birth year] Referring Provider: Marene Lenz DO   Encounter Date: 07/30/2017  PT End of Session - 07/30/17 1441    Visit Number  3    Number of Visits  12    Date for PT Re-Evaluation  09/01/17    PT Start Time  1438    PT Stop Time  1514    PT Time Calculation (min)  36 min    Activity Tolerance  Patient tolerated treatment well    Behavior During Therapy  [facility] for tasks assessed/performed       Past Medical History:  Diagnosis Date  . Allergy    seasonal allergies  . Anemia   . Anxiety   . Arthritis   . Asthma    allergy induced asthma  . Cataract   . Clotting disorder (HCC)    history of blood clots  . Complication of anesthesia    arrhythmia following colonoscopy  . Degenerative disc disease, lumbar   . Degenerative disc disease, lumbar   . Depression   . Diabetes mellitus   . Diabetic neuropathy (HCC) 11/16/2015  . Dysrhythmia   . GERD (gastroesophageal reflux disease)   . H/O eating disorder   . H/O transfusion    patient was given 5 units of blood while at [facility], blood type O+  . History of chicken pox   . History of hiatal hernia   . History of measles, mumps, or rubella   . HOH (hard of hearing)   . Hyperlipidemia   . Hypertension   . Irregular heartbeat   . Moderate COPD (chronic obstructive pulmonary disease) (HCC) 12/14/2014  . Neuropathy   . Opiate use 11/16/2015  . Orthopnea   . Pes planus of both feet 11/16/2015  . Plantar fasciitis of right foot 11/16/2015  . Wheezing     Past Surgical History:  Procedure Laterality Date  . ABDOMINAL HYSTERECTOMY    . CATARACT EXTRACTION W/PHACO Right 05/30/2015   Procedure: CATARACT EXTRACTION PHACO AND INTRAOCULAR LENS PLACEMENT (IOC);  Surgeon:  Galen Manila, MD;  Location: [facility] ORS;  Service: Ophthalmology;  Laterality: Right;  [place] 00:57 AP% 20.9 CDE 11.99 fluid pack lot #[id number]  . CHOLECYSTECTOMY    . COLON SURGERY     blocked colon  . FOOT FUSION Right 2018  . GALLBLADDER SURGERY    . GASTRIC BYPASS  2010  . internal bleeding  2016  . MOUTH SURGERY    . OVARY SURGERY    . SKIN GRAFT Right 2018   RT foot  . TENOTOMY ACHILLES TENDON     Percuntaneous  . TONSILLECTOMY      There were no vitals filed for this visit.  Subjective Assessment - 07/30/17 1438    Subjective  Patient reports she is still having pain in back and has new medication for spasms. She was seen by spine specialist and and is planning ot have a procedure for back pain/spasms.     Pertinent History  chronic back pain and foot pain over many years since 1995 with previous physical therapy with good results. Patient reports falling 06/02/17 which caused increased back pain and she is now waing to have evaluation by spine specialist    Limitations  Sitting;Standing;Walking;House hold activities    How  long can you sit comfortably?  <1 hour    How long can you stand comfortably?  <20 min    How long can you walk comfortably?  only short distances    Patient Stated Goals  Patient wants to be abe to walk more normally with less pain in back to return to normal activities     Currently in Pain?  Yes    Pain Score  6     Pain Location  Back    Pain Orientation  Left;Posterior    Pain Descriptors / Indicators  Aching    Pain Type  Chronic pain    Pain Onset  More than a month ago    Pain Frequency  Intermittent         Objective: Palpation: spasms mild with mild tenderness lumbar spine paraspinal muscles Gait; ambulating with SPC with base; wide BOS, decreased trunk rotation, decreased ankle DF and push off bilaterally   Treatment:  Re assessed home exercises: scapular retraction at door frame with 1 pillow and a rolled up towel behind head Seated  hip adduction with ball and gluteal setting   Manual therapy: 25 min goal: improve spasms and pain Seated in massage chair: performed STM superficial techniques thoracic to lumbar spine paraspinal muscles and upper gluteal muscles; superficial techniques  Patient response to treatment: Improved soft tissue elasticity along bilateral paraspinal muscles with decreased tenderness to mild, more localized to lower back/sacral region. Patient verbalized good understanding of exercises.     PT Education - 07/30/17 1516    Education provided  Yes    Education Details  re assessed core exercises for home program    Person(s) Educated  Patient    Methods  Explanation    Comprehension  Verbalized understanding          PT Long Term Goals - 07/21/17 1945      PT LONG TERM GOAL #1   Title  Patient will improve MODI score to 40% or better demonstrating improved function with daily tasks and able to stand, sit and walk with less difficulty    Baseline  MODI 55%     Status  New    Target Date  08/11/17      PT LONG TERM GOAL #2   Title  Patient will ben independent with home program for pain control and progressive exercises for flexibility and strength in lumbar spine/right LE  in order to transition to home program     Baseline  limited knowldge of appropriate exercises and progression without cuing, instruction    Status  New    Target Date  09/01/17      PT LONG TERM GOAL #3   Title  Patient will improve MODI score to 30% or better demonstrating improved function with daily tasks and able to stand, sit and walk with less difficulty    Baseline  MODI 55%    Status  New    Target Date  09/01/17            Plan - 07/30/17 1518    Clinical Impression Statement  Patient is improving with decreasing spasms in lower back and responding well to soft tissue mobilization. she continues with weakness in core and requires guidance for correct technique and posture/ alignment.     Rehab  Potential  Fair    Clinical Impairments Affecting Rehab Potential  (+) motivated (-) muliple co morbidities, chronic condition    PT Frequency  2x / week  PT Duration  6 weeks    PT Treatment/Interventions  Electrical Stimulation;Moist Heat;Ultrasound;Patient/family education;Neuromuscular re-education;Therapeutic exercise;Manual techniques    PT Next Visit Plan  pain control, ther. ex, manual therapy    PT Home Exercise Plan  stabilization exercises; observe good body mechanics for daily tasks       Patient will benefit from skilled therapeutic intervention in order to improve the following deficits and impairments:  Decreased strength, Impaired flexibility, Decreased activity tolerance, Impaired perceived functional ability, Pain, Decreased endurance, Difficulty walking  Visit Diagnosis: Other muscle spasm  Chronic bilateral low back pain with sciatica, sciatica laterality unspecified  Muscle weakness (generalized)     Problem List Patient Active Problem List   Diagnosis Date Noted  . Assistance needed with transportation 05/26/2017  . Financial difficulties 05/26/2017  . Needs assistance with community resources 05/26/2017  . Moderate recurrent major depression (HCC) 05/26/2017  . Controlled substance agreement broken 10/11/2016  . Breast cancer screening 06/18/2016  . Knee pain 04/03/2016  . Hyponatremia 03/01/2016  . High triglycerides 12/24/2015  . Pes planus of both feet 11/16/2015  . Plantar fasciitis of right foot 11/16/2015  . Heel spur 11/16/2015  . Diabetic neuropathy (HCC) 11/16/2015  . Right ankle pain 11/16/2015  . Medication monitoring encounter 11/16/2015  . Diabetes mellitus without complication (HCC) 08/15/2015  . Need for Tdap vaccination 08/15/2015  . Chronic pain of multiple joints 08/15/2015  . Abnormal mammogram of right breast 06/19/2015  . Cerumen impaction 03/16/2015  . Hypertension goal BP (blood pressure) < 140/90 12/14/2014  .  Hyperlipidemia LDL goal <100 12/14/2014  . History of transfusion of packed RBC 12/14/2014  . COPD, mild (HCC) 12/14/2014  . Hx of smoking 12/14/2014  . Status post bariatric surgery 12/14/2014  . Morbid obesity (HCC) 12/14/2014  . Chronic radicular lumbar pain 12/14/2014  . History of GI bleed 11/12/2014  . GERD without esophagitis 11/12/2014  . History of small bowel obstruction 09/07/2014    Beacher May PT 07/31/2017, 12:21 PM  [place] [facility]YS[facility] SPORTS MEDICINE [address], [place], [address] Phone: [phone]   Fax:  [phone]  Name: Andrea Holden MRN: [id number] Date of Birth: 09-Jul-1945

## 2017-08-04 ENCOUNTER — Encounter: Payer: Self-pay | Admitting: Physical Therapy

## 2017-08-04 ENCOUNTER — Ambulatory Visit: Payer: PPO | Attending: Physical Medicine and Rehabilitation | Admitting: Physical Therapy

## 2017-08-04 DIAGNOSIS — M5441 Lumbago with sciatica, right side: Secondary | ICD-10-CM | POA: Insufficient documentation

## 2017-08-04 DIAGNOSIS — G8929 Other chronic pain: Secondary | ICD-10-CM | POA: Diagnosis not present

## 2017-08-04 DIAGNOSIS — M6281 Muscle weakness (generalized): Secondary | ICD-10-CM | POA: Diagnosis not present

## 2017-08-04 DIAGNOSIS — M62838 Other muscle spasm: Secondary | ICD-10-CM | POA: Insufficient documentation

## 2017-08-04 DIAGNOSIS — M5440 Lumbago with sciatica, unspecified side: Secondary | ICD-10-CM | POA: Diagnosis not present
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
## 2017-08-06 ENCOUNTER — Ambulatory Visit: Payer: PPO | Admitting: Physical Therapy

## 2017-08-06 ENCOUNTER — Encounter: Payer: Self-pay | Admitting: Physical Therapy

## 2017-08-06 DIAGNOSIS — G8929 Other chronic pain: Secondary | ICD-10-CM

## 2017-08-06 DIAGNOSIS — M62838 Other muscle spasm: Secondary | ICD-10-CM | POA: Diagnosis not present

## 2017-08-06 DIAGNOSIS — M6281 Muscle weakness (generalized): Secondary | ICD-10-CM

## 2017-08-06 DIAGNOSIS — M5440 Lumbago with sciatica, unspecified side: Secondary | ICD-10-CM

## 2017-08-06 NOTE — Therapy (Signed)
[facility] PHYS[facility] AND SPORTS MEDICINE [address] [place], [place], [address] Phone: [phone]   Fax:  [phone]  Physical Therapy Treatment  Patient Details  Name: Andrea MangleDianna J Carriger MRN: [id number] Date of Birth: 10-28-1945 Referring Provider: Marene LenzKalman, Arthur DO   Encounter Date: 08/06/2017  PT End of Session - 08/06/17 1114    Visit Number  5    Number of Visits  12    Date for PT Re-Evaluation  09/01/17    PT Start Time  1107    PT Stop Time  1147    PT Time Calculation (min)  40 min    Activity Tolerance  Patient tolerated treatment well    Behavior During Therapy  [facility] tasks assessed/performed       Past Medical History:  Diagnosis Date  . Allergy    seasonal allergies  . Anemia   . Anxiety   . Arthritis   . Asthma    allergy induced asthma  . Cataract   . Clotting disorder (HCC)    history of blood clots  . Complication of anesthesia    arrhythmia following colonoscopy  . Degenerative disc disease, lumbar   . Degenerative disc disease, lumbar   . Depression   . Diabetes mellitus   . Diabetic neuropathy (HCC) 11/16/2015  . Dysrhythmia   . GERD (gastroesophageal reflux disease)   . H/O eating disorder   . H/O transfusion    patient was given 5 units of blood while at [facility], blood type O+  . History of chicken pox   . History of hiatal hernia   . History of measles, mumps, or rubella   . HOH (hard of hearing)   . Hyperlipidemia   . Hypertension   . Irregular heartbeat   . Moderate COPD (chronic obstructive pulmonary disease) (HCC) 12/14/2014  . Neuropathy   . Opiate use 11/16/2015  . Orthopnea   . Pes planus of both feet 11/16/2015  . Plantar fasciitis of right foot 11/16/2015  . Wheezing     Past Surgical History:  Procedure Laterality Date  . ABDOMINAL HYSTERECTOMY    . CATARACT EXTRACTION W/PHACO Right 05/30/2015   Procedure: CATARACT EXTRACTION PHACO AND INTRAOCULAR LENS PLACEMENT (IOC);  Surgeon:  Galen ManilaWilliam Porfilio, MD;  Location: [facility] ORS;  Service: Ophthalmology;  Laterality: Right;  US 00:57 AP% 20.9 CDE 11.99 fluid pack lot #[id number]  . CHOLECYSTECTOMY    . COLON SURGERY     blocked colon  . FOOT FUSION Right 2018  . GALLBLADDER SURGERY    . GASTRIC BYPASS  2010  . internal bleeding  2016  . MOUTH SURGERY    . OVARY SURGERY    . SKIN GRAFT Right 2018   RT foot  . TENOTOMY ACHILLES TENDON     Percuntaneous  . TONSILLECTOMY      There were no vitals filed for this visit.  Subjective Assessment - 08/06/17 1111    Subjective  Patient reports increased tightness in upper back today.     Pertinent History  chronic back pain and foot pain over many years since 1995 with previous physical therapy with good results. Patient reports falling 06/02/17 which caused increased back pain and she is now waing to have evaluation by spine specialist    Limitations  Sitting;Standing;Walking;House hold activities    How long can you sit comfortably?  <1 hour    How long can you stand comfortably?  <20 min    How  long can you walk comfortably?  only short distances    Patient Stated Goals  Patient wants to be abe to walk more normally with less pain in back to return to normal activities     Currently in Pain?  Yes    Pain Score  5     Pain Location  Back    Pain Orientation  Mid    Pain Descriptors / Indicators  Tightness;Aching    Pain Type  Chronic pain    Pain Onset  More than a month ago    Pain Frequency  Intermittent      Objective: Gait: antalgic, independent with cane with base, decreased arm swing, push off bilaterally Palpation: hyper sensitive, mild spasms palpable along bilateral paraspinal muscles thoracic to upper lumbar spine   Treatment: Therapeutic exercise: patient performed with instruction, verbal cuing of therapist: goal: strength, pain, improve function Sitting:  Hip adduction with ball and glute sets x 10 with 3 second hold  Hip abduction with blue resistive  band x 15 reps bilateral and then each LE single x 15 Knee extension 3# tap stones 2 x 15 Knee flexion 2 x 15 blue resistive band scapular retraction green tubing x 15 bilateral, single x 15 each palloff press forward x 15  Manual therapy:13 min. Goal: spasms, pain STM performed to back with patient seated in massage chair with head and UE's supported, superficial techniques  Patient response to treatment: patient demonstrated improved technique with exercises with moderate assistance, demonstration and VC for correct alignment. Patient with decreased pain from 5/10 to 3- 4/10. Patient with decreased spasms by >50% following STM.        PT Education - 08/06/17 1113    Education provided  Yes    Education Details  exericse instruction for technique    Person(s) Educated  Patient    Methods  Explanation    Comprehension  Verbalized understanding          PT Long Term Goals - 07/21/17 1945      PT LONG TERM GOAL #1   Title  Patient will improve MODI score to 40% or better demonstrating improved function with daily tasks and able to stand, sit and walk with less difficulty    Baseline  MODI 55%     Status  New    Target Date  08/11/17      PT LONG TERM GOAL #2   Title  Patient will ben independent with home program for pain control and progressive exercises for flexibility and strength in lumbar spine/right LE  in order to transition to home program     Baseline  limited knowldge of appropriate exercises and progression without cuing, instruction    Status  New    Target Date  09/01/17      PT LONG TERM GOAL #3   Title  Patient will improve MODI score to 30% or better demonstrating improved function with daily tasks and able to stand, sit and walk with less difficulty    Baseline  MODI 55%    Status  New    Target Date  09/01/17            Plan - 08/06/17 1200    Clinical Impression Statement  Patient able to advance exercises today with moderate cuing for correct  technqiue. She also demonstrated decreased spasms and tenderness in upper back musculature following STM. She continues to benefit from physical therapy intervention to address pain and weakness in order to  achieve goals.     Rehab Potential  Fair    Clinical Impairments Affecting Rehab Potential  (+) motivated (-) muliple co morbidities, chronic condition    PT Frequency  2x / week    PT Duration  6 weeks    PT Treatment/Interventions  Electrical Stimulation;Moist Heat;Ultrasound;Patient/family education;Neuromuscular re-education;Therapeutic exercise;Manual techniques    PT Next Visit Plan  pain control, ther. ex, manual therapy    PT Home Exercise Plan  stabilization exercises; observe good body mechanics for daily tasks       Patient will benefit from skilled therapeutic intervention in order to improve the following deficits and impairments:  Decreased strength, Impaired flexibility, Decreased activity tolerance, Impaired perceived functional ability, Pain, Decreased endurance, Difficulty walking  Visit Diagnosis: Other muscle spasm  Chronic bilateral low back pain with sciatica, sciatica laterality unspecified  Muscle weakness (generalized)     Problem List Patient Active Problem List   Diagnosis Date Noted  . Assistance needed with transportation 05/26/2017  . Financial difficulties 05/26/2017  . Needs assistance with community resources 05/26/2017  . Moderate recurrent major depression (HCC) 05/26/2017  . Controlled substance agreement broken 10/11/2016  . Breast cancer screening 06/18/2016  . Knee pain 04/03/2016  . Hyponatremia 03/01/2016  . High triglycerides 12/24/2015  . Pes planus of both feet 11/16/2015  . Plantar fasciitis of right foot 11/16/2015  . Heel spur 11/16/2015  . Diabetic neuropathy (HCC) 11/16/2015  . Right ankle pain 11/16/2015  . Medication monitoring encounter 11/16/2015  . Diabetes mellitus without complication (HCC) 08/15/2015  . Need for  Tdap vaccination 08/15/2015  . Chronic pain of multiple joints 08/15/2015  . Abnormal mammogram of right breast 06/19/2015  . Cerumen impaction 03/16/2015  . Hypertension goal BP (blood pressure) < 140/90 12/14/2014  . Hyperlipidemia LDL goal <100 12/14/2014  . History of transfusion of packed RBC 12/14/2014  . COPD, mild (HCC) 12/14/2014  . Hx of smoking 12/14/2014  . Status post bariatric surgery 12/14/2014  . Morbid obesity (HCC) 12/14/2014  . Chronic radicular lumbar pain 12/14/2014  . History of GI bleed 11/12/2014  . GERD without esophagitis 11/12/2014  . History of small bowel obstruction 09/07/2014    Beacher May PT 08/07/2017, 6:26 PM  [facility] PHYSICAL AND SPORTS MEDICINE [address] [place], [address] Phone: [phone]   Fax:  [phone]  Name: DANYLE BOENING MRN: [id number] Date of Birth: 09/27/1945

## 2017-08-11 ENCOUNTER — Ambulatory Visit: Payer: PPO | Admitting: Physical Therapy

## 2017-08-11 ENCOUNTER — Encounter: Payer: Self-pay | Admitting: Physical Therapy

## 2017-08-11 DIAGNOSIS — M6281 Muscle weakness (generalized): Secondary | ICD-10-CM

## 2017-08-11 DIAGNOSIS — M62838 Other muscle spasm: Secondary | ICD-10-CM | POA: Diagnosis not present

## 2017-08-11 DIAGNOSIS — M5440 Lumbago with sciatica, unspecified side: Secondary | ICD-10-CM

## 2017-08-11 DIAGNOSIS — G8929 Other chronic pain: Secondary | ICD-10-CM

## 2017-08-11 NOTE — Therapy (Signed)
[facility] [address] [place], [address] Phone: [phone]   Fax:  [phone]  Physical Therapy Treatment  Patient Details  Name: Andrea Holden MRN: [id number] Date of Birth: 07-26-1945 Referring Provider: Marene Lenz DO   Encounter Date: 08/11/2017  PT End of Session - 08/11/17 1524    Visit Number  6    Number of Visits  12    Date for PT Re-Evaluation  09/01/17    PT Start Time  1522    PT Stop Time  1617    PT Time Calculation (min)  55 min    Activity Tolerance  Patient tolerated treatment well    Behavior During Therapy  [facility] for tasks assessed/performed       Past Medical History:  Diagnosis Date  . Allergy    seasonal allergies  . Anemia   . Anxiety   . Arthritis   . Asthma    allergy induced asthma  . Cataract   . Clotting disorder (HCC)    history of blood clots  . Complication of anesthesia    arrhythmia following colonoscopy  . Degenerative disc disease, lumbar   . Degenerative disc disease, lumbar   . Depression   . Diabetes mellitus   . Diabetic neuropathy (HCC) 11/16/2015  . Dysrhythmia   . GERD (gastroesophageal reflux disease)   . H/O eating disorder   . H/O transfusion    patient was given 5 units of blood while at [facility], blood type O+  . History of chicken pox   . History of hiatal hernia   . History of measles, mumps, or rubella   . HOH (hard of hearing)   . Hyperlipidemia   . Hypertension   . Irregular heartbeat   . Moderate COPD (chronic obstructive pulmonary disease) (HCC) 12/14/2014  . Neuropathy   . Opiate use 11/16/2015  . Orthopnea   . Pes planus of both feet 11/16/2015  . Plantar fasciitis of right foot 11/16/2015  . Wheezing     Past Surgical History:  Procedure Laterality Date  . ABDOMINAL HYSTERECTOMY    . CATARACT EXTRACTION W/PHACO Right 05/30/2015   Procedure: CATARACT EXTRACTION PHACO AND INTRAOCULAR LENS PLACEMENT (IOC);  Surgeon:  Galen Manila, MD;  Location: [facility] ORS;  Service: Ophthalmology;  Laterality: Right;  [place] 00:57 AP% 20.9 CDE 11.99 fluid pack lot #[id number]  . CHOLECYSTECTOMY    . COLON SURGERY     blocked colon  . FOOT FUSION Right 2018  . GALLBLADDER SURGERY    . GASTRIC BYPASS  2010  . internal bleeding  2016  . MOUTH SURGERY    . OVARY SURGERY    . SKIN GRAFT Right 2018   RT foot  . TENOTOMY ACHILLES TENDON     Percuntaneous  . TONSILLECTOMY      There were no vitals filed for this visit.  Subjective Assessment - 08/11/17 1524    Subjective  Patient reports pain in right UE, upper back and lower back soreness    Pertinent History  chronic back pain and foot pain over many years since 1995 with previous physical therapy with good results. Patient reports falling 06/02/17 which caused increased back pain and she is now waing to have evaluation by spine specialist    Limitations  Sitting;Standing;Walking;House hold activities    How long can you sit comfortably?  <1 hour    How long can you stand comfortably?  <20 min  How long can you walk comfortably?  only short distances    Patient Stated Goals  Patient wants to be abe to walk more normally with less pain in back to return to normal activities     Currently in Pain?  Yes    Pain Score  6     Pain Location  Back    Pain Orientation  Mid    Pain Descriptors / Indicators  Tightness;Sore;Aching    Pain Type  Chronic pain    Pain Onset  More than a month ago    Pain Frequency  Intermittent         Objective: Gait: forward flexion of trunk, antalgic pattern. Independent using SPC for balance Palpation: hyper sensitive, mild spasms palpable along bilateral paraspinal muscles along thoracic to lumbar spine  Treatment: Therapeutic exercise: patient performed with instruction, verbal cuing of therapist: goal: strength, pain, improve function Sitting:  Hip adduction with ball and glute sets x 10 with 3 second hold  Hip abduction with  blue resistive band x 25 reps bilateral  Knee extension 3# tap stones 2 x 15 Knee flexion 2 x 15 blue resistive band scapular retraction blue resistive band x 15 bilateral, single (doubled band) x 15 each palloff press forward x 15 ( blue resistive band) Straight arm pull downs blue resistive band x 15 (sitting)   Manual therapy:17 min. Goal: spasms, pain STM performed to back with patient seated in massage chair with head and UE's supported, superficial techniques  Patient response to treatment: Improved soft tissue elasticity with decreased tenderness by up to 50% following STM. Patient required VC and assistance to perform exercises with good technique and correct alignment. Pain level decreased from 6/10 to 4/10      PT Education - 08/11/17 1529    Education provided  Yes    Education Details  exercise instruction for technique     Person(s) Educated  Patient    Methods  Explanation;Demonstration;Verbal cues    Comprehension  Verbalized understanding;Verbal cues required;Returned demonstration          PT Long Term Goals - 07/21/17 1945      PT LONG TERM GOAL #1   Title  Patient will improve MODI score to 40% or better demonstrating improved function with daily tasks and able to stand, sit and walk with less difficulty    Baseline  MODI 55%     Status  New    Target Date  08/11/17      PT LONG TERM GOAL #2   Title  Patient will ben independent with home program for pain control and progressive exercises for flexibility and strength in lumbar spine/right LE  in order to transition to home program     Baseline  limited knowldge of appropriate exercises and progression without cuing, instruction    Status  New    Target Date  09/01/17      PT LONG TERM GOAL #3   Title  Patient will improve MODI score to 30% or better demonstrating improved function with daily tasks and able to stand, sit and walk with less difficulty    Baseline  MODI 55%    Status  New    Target Date   09/01/17            Plan - 08/11/17 1524    Clinical Impression Statement  Patient requires assistance, verbal cues for correct technique and alignment with exercises. She continues with decreased strength in LE's, core and will  benefit from continued physical therapy intervention to achieve goals. .     Rehab Potential  Fair    Clinical Impairments Affecting Rehab Potential  (+) motivated (-) muliple co morbidities, chronic condition    PT Frequency  2x / week    PT Duration  6 weeks    PT Treatment/Interventions  Electrical Stimulation;Moist Heat;Ultrasound;Patient/family education;Neuromuscular re-education;Therapeutic exercise;Manual techniques    PT Next Visit Plan  pain control, ther. ex, manual therapy    PT Home Exercise Plan  stabilization exercises; observe good body mechanics for daily tasks       Patient will benefit from skilled therapeutic intervention in order to improve the following deficits and impairments:  Decreased strength, Impaired flexibility, Decreased activity tolerance, Impaired perceived functional ability, Pain, Decreased endurance, Difficulty walking  Visit Diagnosis: Other muscle spasm  Chronic bilateral low back pain with sciatica, sciatica laterality unspecified  Muscle weakness (generalized)     Problem List Patient Active Problem List   Diagnosis Date Noted  . Assistance needed with transportation 05/26/2017  . Financial difficulties 05/26/2017  . Needs assistance with community resources 05/26/2017  . Moderate recurrent major depression (HCC) 05/26/2017  . Controlled substance agreement broken 10/11/2016  . Breast cancer screening 06/18/2016  . Knee pain 04/03/2016  . Hyponatremia 03/01/2016  . High triglycerides 12/24/2015  . Pes planus of both feet 11/16/2015  . Plantar fasciitis of right foot 11/16/2015  . Heel spur 11/16/2015  . Diabetic neuropathy (HCC) 11/16/2015  . Right ankle pain 11/16/2015  . Medication monitoring  encounter 11/16/2015  . Diabetes mellitus without complication (HCC) 08/15/2015  . Need for Tdap vaccination 08/15/2015  . Chronic pain of multiple joints 08/15/2015  . Abnormal mammogram of right breast 06/19/2015  . Cerumen impaction 03/16/2015  . Hypertension goal BP (blood pressure) < 140/90 12/14/2014  . Hyperlipidemia LDL goal <100 12/14/2014  . History of transfusion of packed RBC 12/14/2014  . COPD, mild (HCC) 12/14/2014  . Hx of smoking 12/14/2014  . Status post bariatric surgery 12/14/2014  . Morbid obesity (HCC) 12/14/2014  . Chronic radicular lumbar pain 12/14/2014  . History of GI bleed 11/12/2014  . GERD without esophagitis 11/12/2014  . History of small bowel obstruction 09/07/2014    Beacher MayBrooks, Marie PT 08/11/2017, 5:13 PM  [facility] [address] [place], [place], [address] Phone: [phone]   Fax:  [phone]  Name: Andrea Holden MRN: [id number] Date of Birth: Aug 26, 1945

## 2017-08-13 ENCOUNTER — Ambulatory Visit: Payer: PPO | Admitting: Physical Therapy

## 2017-08-13 ENCOUNTER — Other Ambulatory Visit: Payer: Self-pay | Admitting: Family Medicine

## 2017-08-13 DIAGNOSIS — M5136 Other intervertebral disc degeneration, lumbar region: Secondary | ICD-10-CM | POA: Diagnosis not present

## 2017-08-13 DIAGNOSIS — M47816 Spondylosis without myelopathy or radiculopathy, lumbar region: Secondary | ICD-10-CM | POA: Diagnosis not present

## 2017-08-13 NOTE — Telephone Encounter (Signed)
Rx request received for 50k vit D Her last vit D was normal I don't show any record of prescription Rx vit D for her Rx denied

## 2017-08-14 ENCOUNTER — Encounter: Payer: Self-pay | Admitting: Physical Therapy

## 2017-08-14 ENCOUNTER — Ambulatory Visit: Payer: PPO | Admitting: Physical Therapy

## 2017-08-14 DIAGNOSIS — G8929 Other chronic pain: Secondary | ICD-10-CM

## 2017-08-14 DIAGNOSIS — M62838 Other muscle spasm: Secondary | ICD-10-CM | POA: Diagnosis not present

## 2017-08-14 DIAGNOSIS — M5440 Lumbago with sciatica, unspecified side: Secondary | ICD-10-CM

## 2017-08-14 DIAGNOSIS — M6281 Muscle weakness (generalized): Secondary | ICD-10-CM

## 2017-08-14 NOTE — Therapy (Signed)
[facility] PHYSICAL AND SPORTS MEDICINE [address], [place], [address] Phone: [phone]   Fax:  [phone]  Physical Therapy Treatment  Patient Details  Name: Andrea Holden MRN: [id number] Date of Birth: 1945/08/14 Referring Provider: Marene Lenz DO   Encounter Date: 08/14/2017  PT End of Session - 08/14/17 1133    Visit Number  7    Number of Visits  12    Date for PT Re-Evaluation  09/01/17    PT Start Time  1124    PT Stop Time  1155    PT Time Calculation (min)  31 min    Activity Tolerance  Patient tolerated treatment well    Behavior During Therapy  [facility] for tasks assessed/performed       Past Medical History:  Diagnosis Date  . Allergy    seasonal allergies  . Anemia   . Anxiety   . Arthritis   . Asthma    allergy induced asthma  . Cataract   . Clotting disorder (HCC)    history of blood clots  . Complication of anesthesia    arrhythmia following colonoscopy  . Degenerative disc disease, lumbar   . Degenerative disc disease, lumbar   . Depression   . Diabetes mellitus   . Diabetic neuropathy (HCC) 11/16/2015  . Dysrhythmia   . GERD (gastroesophageal reflux disease)   . H/O eating disorder   . H/O transfusion    patient was given 5 units of blood while at [facility], blood type O+  . History of chicken pox   . History of hiatal hernia   . History of measles, mumps, or rubella   . HOH (hard of hearing)   . Hyperlipidemia   . Hypertension   . Irregular heartbeat   . Moderate COPD (chronic obstructive pulmonary disease) (HCC) 12/14/2014  . Neuropathy   . Opiate use 11/16/2015  . Orthopnea   . Pes planus of both feet 11/16/2015  . Plantar fasciitis of right foot 11/16/2015  . Wheezing     Past Surgical History:  Procedure Laterality Date  . ABDOMINAL HYSTERECTOMY    . CATARACT EXTRACTION W/PHACO Right 05/30/2015   Procedure: CATARACT EXTRACTION PHACO AND INTRAOCULAR LENS PLACEMENT (IOC);  Surgeon:  Galen Manila, MD;  Location: [facility] ORS;  Service: Ophthalmology;  Laterality: Right;  [place] 00:57 AP% 20.9 CDE 11.99 fluid pack lot #[id number]  . CHOLECYSTECTOMY    . COLON SURGERY     blocked colon  . FOOT FUSION Right 2018  . GALLBLADDER SURGERY    . GASTRIC BYPASS  2010  . internal bleeding  2016  . MOUTH SURGERY    . OVARY SURGERY    . SKIN GRAFT Right 2018   RT foot  . TENOTOMY ACHILLES TENDON     Percuntaneous  . TONSILLECTOMY      There were no vitals filed for this visit.  Subjective Assessment - 08/14/17 1126    Subjective  Patient reports her back pain is improved from previous session. She is curently on prednisone and feels this is helping.     Pertinent History  chronic back pain and foot pain over many years since 1995 with previous physical therapy with good results. Patient reports falling 06/02/17 which caused increased back pain and she is now waing to have evaluation by spine specialist    Limitations  Sitting;Standing;Walking;House hold activities    How long can you sit comfortably?  <1 hour    How  long can you stand comfortably?  <20 min    How long can you walk comfortably?  only short distances    Patient Stated Goals  Patient wants to be abe to walk more normally with less pain in back to return to normal activities     Currently in Pain?  Yes    Pain Score  4     Pain Location  Back    Pain Orientation  Lower    Pain Descriptors / Indicators  Tightness;Sore;Aching    Pain Type  Chronic pain    Pain Onset  More than a month ago    Pain Frequency  Intermittent          Objective: Gait: more erect posture with ambulation, using SPC  Treatment: Therapeutic exercise: patient performed with instruction, verbal cuing of therapist: goal: strength, pain, improve function Sitting:  Hip adductionwith ball and glute sets x 10 with 3 second hold Hip abductionwith blue resistive band 1 x 15 reps bilateral, x 15 reps each LE  Knee extension 3# tap  stones 2 x 15 Knee flexion 2 x 15 blue resistive band Ankle DF blue resistive band 2 x 15 scapular retraction blue resistive band x 15 bilateral, single (doubled band) x 15 each palloff press forward 2 x 15 ( blue resistive band and then blue with green) Straight arm pull downs blue resistive band x 15 then blue + green band x 12 reps (sitting)  Patient response to treatment: improved posture, technique with exercises with minimal assistance, VC.     PT Education - 08/14/17 1225    Education provided  Yes    Education Details  exercise instruction for technique    Person(s) Educated  Patient    Methods  Explanation;Demonstration;Verbal cues    Comprehension  Verbal cues required;Returned demonstration;Verbalized understanding          PT Long Term Goals - 07/21/17 1945      PT LONG TERM GOAL #1   Title  Patient will improve MODI score to 40% or better demonstrating improved function with daily tasks and able to stand, sit and walk with less difficulty    Baseline  MODI 55%     Status  New    Target Date  08/11/17      PT LONG TERM GOAL #2   Title  Patient will ben independent with home program for pain control and progressive exercises for flexibility and strength in lumbar spine/right LE  in order to transition to home program     Baseline  limited knowldge of appropriate exercises and progression without cuing, instruction    Status  New    Target Date  09/01/17      PT LONG TERM GOAL #3   Title  Patient will improve MODI score to 30% or better demonstrating improved function with daily tasks and able to stand, sit and walk with less difficulty    Baseline  MODI 55%    Status  New    Target Date  09/01/17            Plan - 08/14/17 1133    Clinical Impression Statement  Patient able to tolerate exercises with less cuing and improved technqiue. She demonstrated improved endurance with exercises and mild pain reported in back with exercises. She is progressing  steadily with goals.    Rehab Potential  Fair    Clinical Impairments Affecting Rehab Potential  (+) motivated (-) muliple co morbidities, chronic condition  PT Frequency  2x / week    PT Duration  6 weeks    PT Treatment/Interventions  Electrical Stimulation;Moist Heat;Ultrasound;Patient/family education;Neuromuscular re-education;Therapeutic exercise;Manual techniques    PT Next Visit Plan  pain control, ther. ex, manual therapy    PT Home Exercise Plan  stabilization exercises; observe good body mechanics for daily tasks       Patient will benefit from skilled therapeutic intervention in order to improve the following deficits and impairments:  Decreased strength, Impaired flexibility, Decreased activity tolerance, Impaired perceived functional ability, Pain, Decreased endurance, Difficulty walking  Visit Diagnosis: Other muscle spasm  Chronic bilateral low back pain with sciatica, sciatica laterality unspecified  Muscle weakness (generalized)     Problem List Patient Active Problem List   Diagnosis Date Noted  . Assistance needed with transportation 05/26/2017  . Financial difficulties 05/26/2017  . Needs assistance with community resources 05/26/2017  . Moderate recurrent major depression (HCC) 05/26/2017  . Controlled substance agreement broken 10/11/2016  . Breast cancer screening 06/18/2016  . Knee pain 04/03/2016  . Hyponatremia 03/01/2016  . High triglycerides 12/24/2015  . Pes planus of both feet 11/16/2015  . Plantar fasciitis of right foot 11/16/2015  . Heel spur 11/16/2015  . Diabetic neuropathy (HCC) 11/16/2015  . Right ankle pain 11/16/2015  . Medication monitoring encounter 11/16/2015  . Diabetes mellitus without complication (HCC) 08/15/2015  . Need for Tdap vaccination 08/15/2015  . Chronic pain of multiple joints 08/15/2015  . Abnormal mammogram of right breast 06/19/2015  . Cerumen impaction 03/16/2015  . Hypertension goal BP (blood pressure) <  140/90 12/14/2014  . Hyperlipidemia LDL goal <100 12/14/2014  . History of transfusion of packed RBC 12/14/2014  . COPD, mild (HCC) 12/14/2014  . Hx of smoking 12/14/2014  . Status post bariatric surgery 12/14/2014  . Morbid obesity (HCC) 12/14/2014  . Chronic radicular lumbar pain 12/14/2014  . History of GI bleed 11/12/2014  . GERD without esophagitis 11/12/2014  . History of small bowel obstruction 09/07/2014    Beacher May PT 08/14/2017, 12:26 PM  [place] [facility] PHYSICAL AND SPORTS MEDICINE [address] [place], [address] Phone: [phone]   Fax:  [phone]  Name: Andrea Holden MRN: [id number] Date of Birth: 1946/02/27

## 2017-08-15 ENCOUNTER — Telehealth: Payer: Self-pay | Admitting: Family Medicine

## 2017-08-15 DIAGNOSIS — M47816 Spondylosis without myelopathy or radiculopathy, lumbar region: Secondary | ICD-10-CM | POA: Diagnosis not present

## 2017-08-15 DIAGNOSIS — M5136 Other intervertebral disc degeneration, lumbar region: Secondary | ICD-10-CM | POA: Diagnosis not present

## 2017-08-15 NOTE — Telephone Encounter (Signed)
Copied from CRM [id number]. Topic: Quick Communication - See Telephone Encounter >> Aug 15, 2017  8:47 AM Guinevere FerrariMorris, Cerys Winget E, NT wrote: CRM for notification. See Telephone encounter for: Patient called and wanted to see if the nurse could give her a call back regarding recent telephone call.  08/15/17.

## 2017-08-15 NOTE — Telephone Encounter (Signed)
Called pt back she was wanting denial letter for court.  Already on Dr. Marlise EvesLada's desk for review, but has been out sick all week.

## 2017-08-18 ENCOUNTER — Other Ambulatory Visit: Payer: Self-pay | Admitting: Family Medicine

## 2017-08-18 NOTE — Telephone Encounter (Signed)
I received a request for patient to be excused for jury duty I tried to reach patient; mailbox is full [place] [facility] [place] [facility] [place] 8, [phone] Left message for Kinnie ScalesLisa Johnson [place] I'd like to help patient be able to serve her civic duty; patient's note just says that she cannot walk from the free parking lot to the court I'd like to know what services are available to help a patient who cannot walk a long distance to help them serve their civic duty Juror number: [phone] Panel number: [phone]

## 2017-08-19 ENCOUNTER — Encounter: Payer: Self-pay | Admitting: Family Medicine

## 2017-08-19 MED ORDER — RANITIDINE HCL 300 MG PO TABS: 300.0000 mg | ORAL_TABLET | Freq: Every day | ORAL | 1 refills | Status: DC

## 2017-08-19 NOTE — Addendum Note (Signed)
Addended by: Nailani Full, Janit BernMELINDA P on: 08/19/2017 12:55 PM   Modules accepted: Orders

## 2017-08-19 NOTE — Telephone Encounter (Signed)
[facility] Phone note originally set up for jury duty I have not heard back from courthouse I will provide the excuse

## 2017-08-19 NOTE — Addendum Note (Signed)
Addended by: Davene CostainGRAVES, Lariah Fleer C on: 08/19/2017 10:47 AM   Modules accepted: Orders

## 2017-08-19 NOTE — Telephone Encounter (Signed)
Copied from CRM [id number]. Topic: General - Other >> Aug 19, 2017  9:50 AM Raquel SarnaHayes, Teresa G wrote: Ranitidine   Pt is out of Rx. -  has been without Rx for 1 week. Please fill asap.  Pt requesting to please text pt when it's been called in.  [address] [place] [address] [place] [address] Phone: [phone] Fax: [phone]

## 2017-08-20 ENCOUNTER — Ambulatory Visit: Payer: PPO | Admitting: Physical Therapy

## 2017-08-20 ENCOUNTER — Encounter: Payer: Self-pay | Admitting: Physical Therapy

## 2017-08-20 DIAGNOSIS — M5440 Lumbago with sciatica, unspecified side: Secondary | ICD-10-CM

## 2017-08-20 DIAGNOSIS — M62838 Other muscle spasm: Secondary | ICD-10-CM | POA: Diagnosis not present

## 2017-08-20 DIAGNOSIS — M6281 Muscle weakness (generalized): Secondary | ICD-10-CM

## 2017-08-20 DIAGNOSIS — G8929 Other chronic pain: Secondary | ICD-10-CM

## 2017-08-20 NOTE — Therapy (Signed)
[facility] PHYSICAL AND SPORTS MEDICINE [address] [place], [place], [address] Phone: [phone]   Fax:  [phone]  Physical Therapy Treatment  Patient Details  Name: Andrea Holden MRN: [id number] Date of Birth: 08-Aug-1945 Referring Provider: Marene LenzKalman, Arthur DO   Encounter Date: 08/20/2017  PT End of Session - 08/20/17 1529    Visit Number  8    Number of Visits  12    Date for PT Re-Evaluation  09/01/17    PT Start Time  1410    PT Stop Time  1446    PT Time Calculation (min)  36 min    Activity Tolerance  Patient tolerated treatment well    Behavior During Therapy  [facility] for tasks assessed/performed       Past Medical History:  Diagnosis Date  . Allergy    seasonal allergies  . Anemia   . Anxiety   . Arthritis   . Asthma    allergy induced asthma  . Cataract   . Clotting disorder (HCC)    history of blood clots  . Complication of anesthesia    arrhythmia following colonoscopy  . Degenerative disc disease, lumbar   . Degenerative disc disease, lumbar   . Depression   . Diabetes mellitus   . Diabetic neuropathy (HCC) 11/16/2015  . Dysrhythmia   . GERD (gastroesophageal reflux disease)   . H/O eating disorder   . H/O transfusion    patient [facility], blood type O+  . History of chicken pox   . History of hiatal hernia   . History of measles, mumps, or rubella   . HOH (hard of hearing)   . Hyperlipidemia   . Hypertension   . Irregular heartbeat   . Moderate COPD (chronic obstructive pulmonary disease) (HCC) 12/14/2014  . Neuropathy   . Opiate use 11/16/2015  . Orthopnea   . Pes planus of both feet 11/16/2015  . Plantar fasciitis of right foot 11/16/2015  . Wheezing     Past Surgical History:  Procedure Laterality Date  . ABDOMINAL HYSTERECTOMY    . CATARACT EXTRACTION W/PHACO Right 05/30/2015   Procedure: CATARACT EXTRACTION PHACO AND INTRAOCULAR LENS PLACEMENT (IOC);  Surgeon:  Galen ManilaWilliam Porfilio, MD;  Location: [facility] ORS;  Service: Ophthalmology;  Laterality: Right;  [place] 00:57 AP% 20.9 CDE 11.99 fluid pack lot #[id number]  . CHOLECYSTECTOMY    . COLON SURGERY     blocked colon  . FOOT FUSION Right 2018  . GALLBLADDER SURGERY    . GASTRIC BYPASS  2010  . internal bleeding  2016  . MOUTH SURGERY    . OVARY SURGERY    . SKIN GRAFT Right 2018   RT foot  . TENOTOMY ACHILLES TENDON     Percuntaneous  . TONSILLECTOMY      There were no vitals filed for this visit.  Subjective Assessment - 08/20/17 1426    Subjective  Increased right side lower back pain, limits ability to weight bear and walk without difficulty.     Pertinent History  chronic back pain and foot pain over many years since 1995 with previous physical therapy with good results. Patient reports falling 06/02/17 which caused increased back pain and she is now waing to have evaluation by spine specialist    Limitations  Sitting;Standing;Walking;House hold activities    How long can you sit comfortably?  <1 hour    How long can you stand comfortably?  <  20 min    How long can you walk comfortably?  only short distances    Patient Stated Goals  Patient wants to be abe to walk more normally with less pain in back to return to normal activities     Currently in Pain?  Yes    Pain Score  8     Pain Location  Back    Pain Orientation  Lower;Right    Pain Type  Chronic pain    Pain Onset  More than a month ago    Pain Frequency  Intermittent       Objective: Gait: antalgic gait pattern, using SPC  Treatment: Therapeutic exercise: patient performed with instruction, verbal cuing of therapist: goal: strength, pain, improve function Sitting:  Hip adductionwith ball and glute sets x 10 with 3 second hold Hip abductionwith blue resistive band 1 x 15 reps bilateral, x 15 reps each LE  Knee extension 3# tap stones  x 15 Knee flexion  x 15 blue resistive band Ankle DF blue resistive band  x 15  Manual  therapy: 15 min. Goal: pain, spasms STM performed to back thoracic to lower lumbar spine with concentration on right side lower back. Superficial techniques  Patient response to treatment: limited session with exercises due to exacerbation of back pain. Minimal relief of pain during session, no worse.       PT Education - 08/20/17 1428    Education provided  Yes    Education Details  pain control strategies discussed: ice vs heat    Person(s) Educated  Patient    Methods  Explanation    Comprehension  Verbalized understanding          PT Long Term Goals - 07/21/17 1945      PT LONG TERM GOAL #1   Title  Patient will improve MODI score to 40% or better demonstrating improved function with daily tasks and able to stand, sit and walk with less difficulty    Baseline  MODI 55%     Status  New    Target Date  08/11/17      PT LONG TERM GOAL #2   Title  Patient will ben independent with home program for pain control and progressive exercises for flexibility and strength in lumbar spine/right LE  in order to transition to home program     Baseline  limited knowldge of appropriate exercises and progression without cuing, instruction    Status  New    Target Date  09/01/17      PT LONG TERM GOAL #3   Title  Patient will improve MODI score to 30% or better demonstrating improved function with daily tasks and able to stand, sit and walk with less difficulty    Baseline  MODI 55%    Status  New    Target Date  09/01/17            Plan - 08/20/17 1530    Clinical Impression Statement  Patient limited in exercises today due to pain in back. She has been instructed in pain control strategies and discussed gait pattern as possible contribuing factor to back pain being exacerbated.     Rehab Potential  Fair    Clinical Impairments Affecting Rehab Potential  (+) motivated (-) muliple co morbidities, chronic condition    PT Frequency  2x / week    PT Duration  6 weeks    PT  Treatment/Interventions  Electrical Stimulation;Moist Heat;Ultrasound;Patient/family education;Neuromuscular re-education;Therapeutic exercise;Manual techniques  PT Next Visit Plan  pain control, ther. ex, manual therapy    PT Home Exercise Plan  stabilization exercises; observe good body mechanics for daily tasks       Patient will benefit from skilled therapeutic intervention in order to improve the following deficits and impairments:  Decreased strength, Impaired flexibility, Decreased activity tolerance, Impaired perceived functional ability, Pain, Decreased endurance, Difficulty walking  Visit Diagnosis: Other muscle spasm  Chronic bilateral low back pain with sciatica, sciatica laterality unspecified  Muscle weakness (generalized)     Problem List Patient Active Problem List   Diagnosis Date Noted  . Assistance needed with transportation 05/26/2017  . Financial difficulties 05/26/2017  . Needs assistance with community resources 05/26/2017  . Moderate recurrent major depression (HCC) 05/26/2017  . Controlled substance agreement broken 10/11/2016  . Breast cancer screening 06/18/2016  . Knee pain 04/03/2016  . Hyponatremia 03/01/2016  . High triglycerides 12/24/2015  . Pes planus of both feet 11/16/2015  . Plantar fasciitis of right foot 11/16/2015  . Heel spur 11/16/2015  . Diabetic neuropathy (HCC) 11/16/2015  . Right ankle pain 11/16/2015  . Medication monitoring encounter 11/16/2015  . Diabetes mellitus without complication (HCC) 08/15/2015  . Need for Tdap vaccination 08/15/2015  . Chronic pain of multiple joints 08/15/2015  . Abnormal mammogram of right breast 06/19/2015  . Cerumen impaction 03/16/2015  . Hypertension goal BP (blood pressure) < 140/90 12/14/2014  . Hyperlipidemia LDL goal <100 12/14/2014  . History of transfusion of packed RBC 12/14/2014  . COPD, mild (HCC) 12/14/2014  . Hx of smoking 12/14/2014  . Status post bariatric surgery 12/14/2014   . Morbid obesity (HCC) 12/14/2014  . Chronic radicular lumbar pain 12/14/2014  . History of GI bleed 11/12/2014  . GERD without esophagitis 11/12/2014  . History of small bowel obstruction 09/07/2014    Beacher May PT 08/21/2017, 1:35 PM  [facility] PHYSICAL AND SPORTS MEDICINE [address] [place], [address] Phone: [phone]   Fax:  [phone]  Name: Andrea Holden MRN: [id number] Date of Birth: Jan 30, 1946

## 2017-08-21 ENCOUNTER — Encounter: Payer: Self-pay | Admitting: Physical Therapy

## 2017-08-21 ENCOUNTER — Ambulatory Visit: Payer: PPO | Admitting: Physical Therapy

## 2017-08-21 ENCOUNTER — Telehealth: Payer: Self-pay | Admitting: Family Medicine

## 2017-08-21 DIAGNOSIS — M62838 Other muscle spasm: Secondary | ICD-10-CM | POA: Diagnosis not present

## 2017-08-21 DIAGNOSIS — M6281 Muscle weakness (generalized): Secondary | ICD-10-CM

## 2017-08-21 DIAGNOSIS — M5440 Lumbago with sciatica, unspecified side: Secondary | ICD-10-CM

## 2017-08-21 DIAGNOSIS — G8929 Other chronic pain: Secondary | ICD-10-CM

## 2017-08-21 NOTE — Telephone Encounter (Signed)
Pt is asking for dr to call her . Has a few questions about medications that the dr denied . They are vitamin C and Vit D

## 2017-08-21 NOTE — Telephone Encounter (Signed)
Left detailed voicemail

## 2017-08-22 NOTE — Therapy (Signed)
[facility] PHYSICAL AND SPORTS MEDICINE [address] [place], [place], [address] Phone: [phone]   Fax:  [phone]  Physical Therapy Treatment  Patient Details  Name: Andrea Holden MRN: [id number] Date of Birth: 19-Dec-1945 Referring Provider: Marene LenzKalman, Arthur DO   Encounter Date: 08/21/2017  PT End of Session - 08/21/17 1345    Visit Number  9    Number of Visits  12    Date for PT Re-Evaluation  09/01/17    PT Start Time  1310    PT Stop Time  1345    PT Time Calculation (min)  35 min    Activity Tolerance  Patient tolerated treatment well    Behavior During Therapy  [facility] for tasks assessed/performed       Past Medical History:  Diagnosis Date  . Allergy    seasonal allergies  . Anemia   . Anxiety   . Arthritis   . Asthma    allergy induced asthma  . Cataract   . Clotting disorder (HCC)    history of blood clots  . Complication of anesthesia    arrhythmia following colonoscopy  . Degenerative disc disease, lumbar   . Degenerative disc disease, lumbar   . Depression   . Diabetes mellitus   . Diabetic neuropathy (HCC) 11/16/2015  . Dysrhythmia   . GERD (gastroesophageal reflux disease)   . H/O eating disorder   . H/O transfusion    [facility], [facility] type O+  . History of chicken pox   . History of hiatal hernia   . History of measles, mumps, or rubella   . HOH (hard of hearing)   . Hyperlipidemia   . Hypertension   . Irregular heartbeat   . Moderate COPD (chronic obstructive pulmonary disease) (HCC) 12/14/2014  . Neuropathy   . Opiate use 11/16/2015  . Orthopnea   . Pes planus of both feet 11/16/2015  . Plantar fasciitis of right foot 11/16/2015  . Wheezing     Past Surgical History:  Procedure Laterality Date  . ABDOMINAL HYSTERECTOMY    . CATARACT EXTRACTION W/PHACO Right 05/30/2015   Procedure: CATARACT EXTRACTION PHACO AND INTRAOCULAR LENS PLACEMENT (IOC);  Surgeon:  Galen ManilaWilliam Porfilio, MD;  Location: [facility] ORS;  Service: Ophthalmology;  Laterality: Right;  US 00:57 AP% 20.9 CDE 11.99 fluid pack lot #[id number]  . CHOLECYSTECTOMY    . COLON SURGERY     blocked colon  . FOOT FUSION Right 2018  . GALLBLADDER SURGERY    . GASTRIC BYPASS  2010  . internal bleeding  2016  . MOUTH SURGERY    . OVARY SURGERY    . SKIN GRAFT Right 2018   RT foot  . TENOTOMY ACHILLES TENDON     Percuntaneous  . TONSILLECTOMY      There were no vitals filed for this visit.  Subjective Assessment - 08/21/17 1339    Subjective  Continues with right sided lower back pain and across entire lower back; unable to walk without difficulty    Pertinent History  chronic back pain and foot pain over many years since 1995 with previous physical therapy with good results. Patient reports falling 06/02/17 which caused increased back pain and she is now waing to have evaluation by spine specialist    Limitations  Sitting;Standing;Walking;House hold activities    How long can you sit comfortably?  <1 hour    How long can you stand  comfortably?  <20 min    How long can you walk comfortably?  only short distances    Patient Stated Goals  Patient wants to be abe to walk more normally with less pain in back to return to normal activities     Currently in Pain?  Yes    Pain Score  8     Pain Location  Back    Pain Orientation  Lower;Left;Right    Pain Descriptors / Indicators  Spasm;Tightness;Sore;Aching    Pain Type  Chronic pain    Pain Onset  More than a month ago    Pain Frequency  Constant        Objective: Gait: antalgic pattern, ambulating with SPC with slow cadence and reaching out for environmental support Palpation: point tender along bilateral lumbar spine paraspinal muscles  Treatment:  Modalities: 30 min. Goal: pain, spasms Electrical stimulation: High volt estim.clincial program for muscle spasms  (4) electrodes applied to bilateral lower back, lumbar paraspinal muscles,  intensity to tolerance with patient seated in chair with moist heat applied to lower back:goal: pain, spasms; no adverse reactions noted  Patient response to treatment: Patient able to stand and walk with much less pain, 4-5/10, and improved posture at end of session.    PT Education - 08/21/17 1341    Education provided  Yes    Education Details  Paint control strategies; exercise instruction    Person(s) Educated  Patient    Methods  Explanation    Comprehension  Verbalized understanding          PT Long Term Goals - 07/21/17 1945      PT LONG TERM GOAL #1   Title  Patient will improve MODI score to 40% or better demonstrating improved function with daily tasks and able to stand, sit and walk with less difficulty    Baseline  MODI 55%     Status  New    Target Date  08/11/17      PT LONG TERM GOAL #2   Title  Patient will ben independent with home program for pain control and progressive exercises for flexibility and strength in lumbar spine/right LE  in order to transition to home program     Baseline  limited knowldge of appropriate exercises and progression without cuing, instruction    Status  New    Target Date  09/01/17      PT LONG TERM GOAL #3   Title  Patient will improve MODI score to 30% or better demonstrating improved function with daily tasks and able to stand, sit and walk with less difficulty    Baseline  MODI 55%    Status  New    Target Date  09/01/17            Plan - 08/21/17 1330    Clinical Impression Statement  Patient with increased pain today, arrived 10 minutes late and treatment focused on pain relief with good results. Patient was able to stand and walk with less pain and improved gait pattern following treatment.     Rehab Potential  Fair    Clinical Impairments Affecting Rehab Potential  (+) motivated (-) muliple co morbidities, chronic condition    PT Frequency  2x / week    PT Duration  6 weeks    PT Treatment/Interventions   Electrical Stimulation;Moist Heat;Ultrasound;Patient/family education;Neuromuscular re-education;Therapeutic exercise;Manual techniques    PT Next Visit Plan  pain control, ther. ex, manual therapy    PT Home Exercise  Plan  stabilization exercises; observe good body mechanics for daily tasks       Patient will benefit from skilled therapeutic intervention in order to improve the following deficits and impairments:  Decreased strength, Impaired flexibility, Decreased activity tolerance, Impaired perceived functional ability, Pain, Decreased endurance, Difficulty walking  Visit Diagnosis: Other muscle spasm  Chronic bilateral low back pain with sciatica, sciatica laterality unspecified  Muscle weakness (generalized)     Problem List Patient Active Problem List   Diagnosis Date Noted  . Assistance needed with transportation 05/26/2017  . Financial difficulties 05/26/2017  . Needs assistance with community resources 05/26/2017  . Moderate recurrent major depression (HCC) 05/26/2017  . Controlled substance agreement broken 10/11/2016  . Breast cancer screening 06/18/2016  . Knee pain 04/03/2016  . Hyponatremia 03/01/2016  . High triglycerides 12/24/2015  . Pes planus of both feet 11/16/2015  . Plantar fasciitis of right foot 11/16/2015  . Heel spur 11/16/2015  . Diabetic neuropathy (HCC) 11/16/2015  . Right ankle pain 11/16/2015  . Medication monitoring encounter 11/16/2015  . Diabetes mellitus without complication (HCC) 08/15/2015  . Need for Tdap vaccination 08/15/2015  . Chronic pain of multiple joints 08/15/2015  . Abnormal mammogram of right breast 06/19/2015  . Cerumen impaction 03/16/2015  . Hypertension goal BP (blood pressure) < 140/90 12/14/2014  . Hyperlipidemia LDL goal <100 12/14/2014  . History of transfusion of packed RBC 12/14/2014  . COPD, mild (HCC) 12/14/2014  . Hx of smoking 12/14/2014  . Status post bariatric surgery 12/14/2014  . Morbid obesity (HCC)  12/14/2014  . Chronic radicular lumbar pain 12/14/2014  . History of GI bleed 11/12/2014  . GERD without esophagitis 11/12/2014  . History of small bowel obstruction 09/07/2014    Beacher May PT 08/22/2017, 1:33 PM  [place] [facility] PHYSICAL AND SPORTS MEDICINE [address] [place], [address] Phone: [phone]   Fax:  [phone]  Name: Andrea Holden MRN: [id number] Date of Birth: Oct 19, 1945

## 2017-08-25 ENCOUNTER — Encounter: Payer: Self-pay | Admitting: Physical Therapy

## 2017-08-25 ENCOUNTER — Ambulatory Visit: Payer: PPO | Admitting: Physical Therapy

## 2017-08-25 DIAGNOSIS — M62838 Other muscle spasm: Secondary | ICD-10-CM | POA: Diagnosis not present

## 2017-08-25 DIAGNOSIS — M5440 Lumbago with sciatica, unspecified side: Secondary | ICD-10-CM

## 2017-08-25 DIAGNOSIS — G8929 Other chronic pain: Secondary | ICD-10-CM

## 2017-08-25 NOTE — Therapy (Signed)
[place] [facility] PH[facility]ORTS MEDICINE [address], [place], [address] Phone: [phone]   Fax:  [phone]  Physical Therapy Treatment  Patient Details  Name: Andrea Holden MRN: [id number] Date of Birth: Sep 20, 1945 Referring Provider: Marene Lenz DO   Encounter Date: 08/25/2017  PT End of Session - 08/25/17 0908    Visit Number  10    Number of Visits  12    Date for PT Re-Evaluation  09/01/17    PT Start Time  0848    PT Stop Time  0924    PT Time Calculation (min)  36 min    Activity Tolerance  Patient tolerated treatment well    Behavior During Therapy  [facility] for tasks assessed/performed       Past Medical History:  Diagnosis Date  . Allergy    seasonal allergies  . Anemia   . Anxiety   . Arthritis   . Asthma    allergy induced asthma  . Cataract   . Clotting disorder (HCC)    history of blood clots  . Complication of anesthesia    arrhythmia following colonoscopy  . Degenerative disc disease, lumbar   . Degenerative disc disease, lumbar   . Depression   . Diabetes mellitus   . Diabetic neuropathy (HCC) 11/16/2015  . Dysrhythmia   . GERD (gastroesophageal reflux disease)   . H/O eating disorder   . H/O transfusion    patient was given 5 units of blood while at [facility], blood type O+  . History of chicken pox   . History of hiatal hernia   . History of measles, mumps, or rubella   . HOH (hard of hearing)   . Hyperlipidemia   . Hypertension   . Irregular heartbeat   . Moderate COPD (chronic obstructive pulmonary disease) (HCC) 12/14/2014  . Neuropathy   . Opiate use 11/16/2015  . Orthopnea   . Pes planus of both feet 11/16/2015  . Plantar fasciitis of right foot 11/16/2015  . Wheezing     Past Surgical History:  Procedure Laterality Date  . ABDOMINAL HYSTERECTOMY    . CATARACT EXTRACTION W/PHACO Right 05/30/2015   Procedure: CATARACT EXTRACTION PHACO AND INTRAOCULAR LENS PLACEMENT (IOC);   Surgeon: Galen Manila, MD;  Location: [facility] ORS;  Service: Ophthalmology;  Laterality: Right;  [place] 00:57 AP% 20.9 CDE 11.99 fluid pack lot #[id number]  . CHOLECYSTECTOMY    . COLON SURGERY     blocked colon  . FOOT FUSION Right 2018  . GALLBLADDER SURGERY    . GASTRIC BYPASS  2010  . internal bleeding  2016  . MOUTH SURGERY    . OVARY SURGERY    . SKIN GRAFT Right 2018   RT foot  . TENOTOMY ACHILLES TENDON     Percuntaneous  . TONSILLECTOMY      There were no vitals filed for this visit.  Subjective Assessment - 08/25/17 0857    Subjective  Patient reports she is having increased pain in lower back right side, did well following previous session and tried heat at home without good results and is now using ice. She states she is not going to be abel to exercise today due to her pain. She is going to have procedure this Wednesday for bakc pain.     Pertinent History  chronic back pain and foot pain over many years since 1995 with previous physical therapy with good results. Patient reports falling 06/02/17 which caused increased  back pain and she is now waing to have evaluation by spine specialist    Limitations  Sitting;Standing;Walking;House hold activities    How long can you sit comfortably?  <1 hour    How long can you stand comfortably?  <20 min    How long can you walk comfortably?  only short distances    Patient Stated Goals  Patient wants to be abe to walk more normally with less pain in back to return to normal activities     Currently in Pain?  Yes    Pain Score  9     Pain Location  Back    Pain Orientation  Right;Left;Lower    Pain Descriptors / Indicators  Tightness;Aching;Sore;Spasm    Pain Type  Chronic pain    Pain Onset  More than a month ago    Pain Frequency  Constant       Objective: Gait: antalgic pattern, ambulating with SPC with slow cadence and reaching out for environmental support Palpation: point tender along bilateral lumbar spine paraspinal  muscles  Treatment:  Modalities: 30 min. Goal: pain, spasms Electrical stimulation: High volt estim.clincial program for muscle spasms  (4) electrodes applied to bilateral lower back, lumbar paraspinal muscles, intensity to tolerance with patient seated in chair with ice pack applied to lower back:goal: pain, spasms; no adverse reactions noted  Patient response to treatment: improved pain level to 6/10    PT Education - 08/25/17 0906    Education provided  Yes    Education Details  Pain control strategies, use of ice vs heat    Person(s) Educated  Patient    Methods  Explanation    Comprehension  Verbalized understanding          PT Long Term Goals - 07/21/17 1945      PT LONG TERM GOAL #1   Title  Patient will improve MODI score to 40% or better demonstrating improved function with daily tasks and able to stand, sit and walk with less difficulty    Baseline  MODI 55%     Status  New    Target Date  08/11/17      PT LONG TERM GOAL #2   Title  Patient will ben independent with home program for pain control and progressive exercises for flexibility and strength in lumbar spine/right LE  in order to transition to home program     Baseline  limited knowldge of appropriate exercises and progression without cuing, instruction    Status  New    Target Date  09/01/17      PT LONG TERM GOAL #3   Title  Patient will improve MODI score to 30% or better demonstrating improved function with daily tasks and able to stand, sit and walk with less difficulty    Baseline  MODI 55%    Status  New    Target Date  09/01/17            Plan - 08/25/17 0908    Clinical Impression Statement  Patient continues with increased pain and requested only pain control be addressed during treatment session today. Pain level improved with ice and estim. treatment from 9/10 to 6/10.     Rehab Potential  Fair    Clinical Impairments Affecting Rehab Potential  (+) motivated (-) muliple co  morbidities, chronic condition    PT Frequency  2x / week    PT Duration  6 weeks    PT Treatment/Interventions  Electrical Stimulation;Moist Heat;Ultrasound;Patient/family  education;Neuromuscular re-education;Therapeutic exercise;Manual techniques    PT Next Visit Plan  pain control, ther. ex, manual therapy    PT Home Exercise Plan  stabilization exercises; observe good body mechanics for daily tasks       Patient will benefit from skilled therapeutic intervention in order to improve the following deficits and impairments:  Decreased strength, Impaired flexibility, Decreased activity tolerance, Impaired perceived functional ability, Pain, Decreased endurance, Difficulty walking  Visit Diagnosis: Other muscle spasm  Chronic bilateral low back pain with sciatica, sciatica laterality unspecified     Problem List Patient Active Problem List   Diagnosis Date Noted  . Assistance needed with transportation 05/26/2017  . Financial difficulties 05/26/2017  . Needs assistance with community resources 05/26/2017  . Moderate recurrent major depression (HCC) 05/26/2017  . Controlled substance agreement broken 10/11/2016  . Breast cancer screening 06/18/2016  . Knee pain 04/03/2016  . Hyponatremia 03/01/2016  . High triglycerides 12/24/2015  . Pes planus of both feet 11/16/2015  . Plantar fasciitis of right foot 11/16/2015  . Heel spur 11/16/2015  . Diabetic neuropathy (HCC) 11/16/2015  . Right ankle pain 11/16/2015  . Medication monitoring encounter 11/16/2015  . Diabetes mellitus without complication (HCC) 08/15/2015  . Need for Tdap vaccination 08/15/2015  . Chronic pain of multiple joints 08/15/2015  . Abnormal mammogram of right breast 06/19/2015  . Cerumen impaction 03/16/2015  . Hypertension goal BP (blood pressure) < 140/90 12/14/2014  . Hyperlipidemia LDL goal <100 12/14/2014  . History of transfusion of packed RBC 12/14/2014  . COPD, mild (HCC) 12/14/2014  . Hx of smoking  12/14/2014  . Status post bariatric surgery 12/14/2014  . Morbid obesity (HCC) 12/14/2014  . Chronic radicular lumbar pain 12/14/2014  . History of GI bleed 11/12/2014  . GERD without esophagitis 11/12/2014  . History of small bowel obstruction 09/07/2014    Beacher May PT 08/25/2017, 10:05 AM  [place] [facility] PHYSICAL AND SPORTS MEDICINE [address] [place], [address] Phone: [phone]   Fax:  [phone]  Name: Andrea Holden MRN: [id number] Date of Birth: [birth year]

## 2017-08-28 ENCOUNTER — Encounter: Payer: Self-pay | Admitting: Physical Therapy

## 2017-08-28 ENCOUNTER — Ambulatory Visit: Payer: PPO | Admitting: Physical Therapy

## 2017-08-28 DIAGNOSIS — M5440 Lumbago with sciatica, unspecified side: Secondary | ICD-10-CM

## 2017-08-28 DIAGNOSIS — M6281 Muscle weakness (generalized): Secondary | ICD-10-CM

## 2017-08-28 DIAGNOSIS — M62838 Other muscle spasm: Secondary | ICD-10-CM | POA: Diagnosis not present

## 2017-08-28 DIAGNOSIS — G8929 Other chronic pain: Secondary | ICD-10-CM

## 2017-08-28 DIAGNOSIS — M5441 Lumbago with sciatica, right side: Secondary | ICD-10-CM

## 2017-08-28 NOTE — Therapy (Signed)
[place] [facility] [address] [place], [address] Phone: [phone]   Fax:  [phone]  Physical Therapy Treatment  Patient Details  Name: Andrea Holden MRN: [id number] Date of Birth: [birth year] Referring Provider: Marene Lenz DO   Encounter Date: 08/28/2017  PT End of Session - 08/28/17 1451    Visit Number  11    Number of Visits  12    Date for PT Re-Evaluation  09/01/17    PT Start Time  1447    PT Stop Time  1515    PT Time Calculation (min)  28 min    Activity Tolerance  Patient tolerated treatment well    Behavior During Therapy  [facility] for tasks assessed/performed       Past Medical History:  Diagnosis Date  . Allergy    seasonal allergies  . Anemia   . Anxiety   . Arthritis   . Asthma    allergy induced asthma  . Cataract   . Clotting disorder (HCC)    history of blood clots  . Complication of anesthesia    arrhythmia following colonoscopy  . Degenerative disc disease, lumbar   . Degenerative disc disease, lumbar   . Depression   . Diabetes mellitus   . Diabetic neuropathy (HCC) 11/16/2015  . Dysrhythmia   . GERD (gastroesophageal reflux disease)   . H/O eating disorder   . H/O transfusion    patient was given 5 units of blood while at [facility], blood type O+  . History of chicken pox   . History of hiatal hernia   . History of measles, mumps, or rubella   . HOH (hard of hearing)   . Hyperlipidemia   . Hypertension   . Irregular heartbeat   . Moderate COPD (chronic obstructive pulmonary disease) (HCC) 12/14/2014  . Neuropathy   . Opiate use 11/16/2015  . Orthopnea   . Pes planus of both feet 11/16/2015  . Plantar fasciitis of right foot 11/16/2015  . Wheezing     Past Surgical History:  Procedure Laterality Date  . ABDOMINAL HYSTERECTOMY    . CATARACT EXTRACTION W/PHACO Right 05/30/2015   Procedure: CATARACT EXTRACTION PHACO AND INTRAOCULAR LENS PLACEMENT (IOC);   Surgeon: Galen Manila, MD;  Location: [facility] ORS;  Service: Ophthalmology;  Laterality: Right;  [place] 00:57 AP% 20.9 CDE 11.99 fluid pack lot #[id number]  . CHOLECYSTECTOMY    . COLON SURGERY     blocked colon  . FOOT FUSION Right 2018  . GALLBLADDER SURGERY    . GASTRIC BYPASS  2010  . internal bleeding  2016  . MOUTH SURGERY    . OVARY SURGERY    . SKIN GRAFT Right 2018   RT foot  . TENOTOMY ACHILLES TENDON     Percuntaneous  . TONSILLECTOMY      There were no vitals filed for this visit.  Subjective Assessment - 08/28/17 1450    Subjective  Patient reports she was unable to have procedure done for back pain because she had taken a baby aspirin prior to going to the office. She has re scheduled her appointment later this month. Currently she is reporting severe lower back right sided pain and would like soft tissue work to help relieve her symptoms.    Pertinent History  chronic back pain and foot pain over many years since 1995 with previous physical therapy with good results. Patient reports falling 06/02/17 which caused increased back pain and  she is now waing to have evaluation by spine specialist    Limitations  Sitting;Standing;Walking;House hold activities    How long can you sit comfortably?  <1 hour    How long can you stand comfortably?  <20 min    How long can you walk comfortably?  only short distances    Patient Stated Goals  Patient wants to be abe to walk more normally with less pain in back to return to normal activities     Currently in Pain?  Yes    Pain Score  9     Pain Location  Back    Pain Orientation  Right;Left;Lower    Pain Descriptors / Indicators  Tightness;Aching;Spasm;Sore    Pain Type  Chronic pain    Pain Onset  More than a month ago    Pain Frequency  Constant        Objective:  Palpation: point tender lower back right and left, mild spasms palpable along right lumbar spine   Treatment:  Manual Therapy: 26 min. Goal: spasm, pain STM seated  in massage chair, concentration right side and bilateral lower lumbar region superficial and deep techniques  Patient response to treatment:  Patient with decreased pain from 9/10 to 5/10. Patient with improved soft tissue elasticity by 30% following STM.     PT Education - 08/28/17 1655    Education provided  Yes    Education Details  pain control and safety awareness with walking at home to use AD that supports her fully to avoid falling    Person(s) Educated  Patient    Methods  Explanation    Comprehension  Verbalized understanding          PT Long Term Goals - 07/21/17 1945      PT LONG TERM GOAL #1   Title  Patient will improve MODI score to 40% or better demonstrating improved function with daily tasks and able to stand, sit and walk with less difficulty    Baseline  MODI 55%     Status  New    Target Date  08/11/17      PT LONG TERM GOAL #2   Title  Patient will ben independent with home program for pain control and progressive exercises for flexibility and strength in lumbar spine/right LE  in order to transition to home program     Baseline  limited knowldge of appropriate exercises and progression without cuing, instruction    Status  New    Target Date  09/01/17      PT LONG TERM GOAL #3   Title  Patient will improve MODI score to 30% or better demonstrating improved function with daily tasks and able to stand, sit and walk with less difficulty    Baseline  MODI 55%    Status  New    Target Date  09/01/17            Plan - 08/28/17 1516    Clinical Impression Statement  Patient demonstrated improved soft tissue elasticity with treatment. She continued with moderate pain in lower back and difficulty with walking. She will continue to benefit from physical therapy intervention to address pain, soft tissue limitations to improve function with ambulation.    Rehab Potential  Fair    Clinical Impairments Affecting Rehab Potential  (+) motivated (-) muliple co  morbidities, chronic condition    PT Frequency  2x / week    PT Duration  6 weeks    PT  Treatment/Interventions  Electrical Stimulation;Moist Heat;Ultrasound;Patient/family education;Neuromuscular re-education;Therapeutic exercise;Manual techniques    PT Next Visit Plan  pain control, ther. ex, manual therapy    PT Home Exercise Plan  stabilization exercises; observe good body mechanics for daily tasks       Patient will benefit from skilled therapeutic intervention in order to improve the following deficits and impairments:  Decreased strength, Impaired flexibility, Decreased activity tolerance, Impaired perceived functional ability, Pain, Decreased endurance, Difficulty walking  Visit Diagnosis: Other muscle spasm  Chronic bilateral low back pain with sciatica, sciatica laterality unspecified  Muscle weakness (generalized)  Chronic bilateral low back pain with right-sided sciatica     Problem List Patient Active Problem List   Diagnosis Date Noted  . Assistance needed with transportation 05/26/2017  . Financial difficulties 05/26/2017  . Needs assistance with community resources 05/26/2017  . Moderate recurrent major depression (HCC) 05/26/2017  . Controlled substance agreement broken 10/11/2016  . Breast cancer screening 06/18/2016  . Knee pain 04/03/2016  . Hyponatremia 03/01/2016  . High triglycerides 12/24/2015  . Pes planus of both feet 11/16/2015  . Plantar fasciitis of right foot 11/16/2015  . Heel spur 11/16/2015  . Diabetic neuropathy (HCC) 11/16/2015  . Right ankle pain 11/16/2015  . Medication monitoring encounter 11/16/2015  . Diabetes mellitus without complication (HCC) 08/15/2015  . Need for Tdap vaccination 08/15/2015  . Chronic pain of multiple joints 08/15/2015  . Abnormal mammogram of right breast 06/19/2015  . Cerumen impaction 03/16/2015  . Hypertension goal BP (blood pressure) < 140/90 12/14/2014  . Hyperlipidemia LDL goal <100 12/14/2014  .  History of transfusion of packed RBC 12/14/2014  . COPD, mild (HCC) 12/14/2014  . Hx of smoking 12/14/2014  . Status post bariatric surgery 12/14/2014  . Morbid obesity (HCC) 12/14/2014  . Chronic radicular lumbar pain 12/14/2014  . History of GI bleed 11/12/2014  . GERD without esophagitis 11/12/2014  . History of small bowel obstruction 09/07/2014    Beacher MayBrooks, Kimberlynn Lumbra PT 08/28/2017, 8:18 PM  [place] [facility] [address] [place], [place], [address] Phone: [phone]   Fax:  [phone]  Name: Juliette MangleDianna J Melberg MRN: [id number] Date of Birth: 19-Jan-1946

## 2017-09-01 ENCOUNTER — Encounter: Payer: Self-pay | Admitting: Physical Therapy

## 2017-09-01 ENCOUNTER — Ambulatory Visit: Payer: PPO | Attending: Physical Medicine and Rehabilitation | Admitting: Physical Therapy

## 2017-09-01 DIAGNOSIS — M6281 Muscle weakness (generalized): Secondary | ICD-10-CM | POA: Insufficient documentation

## 2017-09-01 DIAGNOSIS — M62838 Other muscle spasm: Secondary | ICD-10-CM

## 2017-09-01 DIAGNOSIS — G8929 Other chronic pain: Secondary | ICD-10-CM | POA: Diagnosis not present

## 2017-09-01 DIAGNOSIS — M5440 Lumbago with sciatica, unspecified side: Secondary | ICD-10-CM | POA: Diagnosis not present

## 2017-09-01 NOTE — Therapy (Signed)
[place] [facility] [address], [place], [address] Phone: [phone]   Fax:  [phone]  Physical Therapy Treatment  Patient Details  Name: Andrea Holden MRN: [id number] Date of Birth: 1945/09/30 Referring Provider: Marene Lenz DO   Encounter Date: 09/01/2017  PT End of Session - 09/01/17 1443    Visit Number  12    Number of Visits  12    Date for PT Re-Evaluation  09/01/17    PT Start Time  1440    PT Stop Time  1515    PT Time Calculation (min)  35 min    Activity Tolerance  Patient tolerated treatment well    Behavior During Therapy  [facility] for tasks assessed/performed       Past Medical History:  Diagnosis Date  . Allergy    seasonal allergies  . Anemia   . Anxiety   . Arthritis   . Asthma    allergy induced asthma  . Cataract   . Clotting disorder (HCC)    history of blood clots  . Complication of anesthesia    arrhythmia following colonoscopy  . Degenerative disc disease, lumbar   . Degenerative disc disease, lumbar   . Depression   . Diabetes mellitus   . Diabetic neuropathy (HCC) 11/16/2015  . Dysrhythmia   . GERD (gastroesophageal reflux disease)   . H/O eating disorder   . H/O transfusion    patient was given 5 units of blood while at [facility], blood type O+  . History of chicken pox   . History of hiatal hernia   . History of measles, mumps, or rubella   . HOH (hard of hearing)   . Hyperlipidemia   . Hypertension   . Irregular heartbeat   . Moderate COPD (chronic obstructive pulmonary disease) (HCC) 12/14/2014  . Neuropathy   . Opiate use 11/16/2015  . Orthopnea   . Pes planus of both feet 11/16/2015  . Plantar fasciitis of right foot 11/16/2015  . Wheezing     Past Surgical History:  Procedure Laterality Date  . ABDOMINAL HYSTERECTOMY    . CATARACT EXTRACTION W/PHACO Right 05/30/2015   Procedure: CATARACT EXTRACTION PHACO AND INTRAOCULAR LENS PLACEMENT (IOC);  Surgeon:  Galen Manila, MD;  Location: [facility] ORS;  Service: Ophthalmology;  Laterality: Right;  [place] 00:57 AP% 20.9 CDE 11.99 fluid pack lot #[id number]  . CHOLECYSTECTOMY    . COLON SURGERY     blocked colon  . FOOT FUSION Right 2018  . GALLBLADDER SURGERY    . GASTRIC BYPASS  2010  . internal bleeding  2016  . MOUTH SURGERY    . OVARY SURGERY    . SKIN GRAFT Right 2018   RT foot  . TENOTOMY ACHILLES TENDON     Percuntaneous  . TONSILLECTOMY      There were no vitals filed for this visit.  Subjective Assessment - 09/01/17 1443    Subjective  Patient continues with pain in lower back and is having injection wednesday this week. she reports she responded well to previous session with STM and would like to continue with this today to help with pain.     Pertinent History  chronic back pain and foot pain over many years since 1995 with previous physical therapy with good results. Patient reports falling 06/02/17 which caused increased back pain and she is now waing to have evaluation by spine specialist    Limitations  Sitting;Standing;Walking;House hold activities  How long can you sit comfortably?  <1 hour    How long can you stand comfortably?  <20 min    How long can you walk comfortably?  only short distances    Patient Stated Goals  Patient wants to be abe to walk more normally with less pain in back to return to normal activities     Currently in Pain?  Yes    Pain Score  9     Pain Location  Back    Pain Orientation  Right;Left;Lower    Pain Descriptors / Indicators  Aching;Tightness;Spasm;Sore    Pain Onset  More than a month ago    Pain Frequency  Constant       Objective:  Palpation: point tender lower back right and left, mild spasms palpable along right lumbar spine  Transfers on and off massage chair with close supervision and assistance of therapist to stabilize chair  Gait: antalgic, using Hurry cane for support and contact guard x 1 for safety  Treatment:  Manual  Therapy: 28 min. Goal: spasm, pain STM seated in massage chair, concentration right side and bilateral lower lumbar region superficial and deep techniques  Patient response to treatment: Patient reported feeling "more relaxed" following STM treatment. She had difficulty getting on/off massage chair and required close supervision to and from care for safety due to LE's "giving way" occasionally. Improved pain level by 50% at end of session.      PT Education - 09/01/17 1446    Education provided  Yes    Education Details  re assessed safety awareness with walking with cane vs walker, pain control strategies    Person(s) Educated  Patient    Methods  Explanation    Comprehension  Verbalized understanding          PT Long Term Goals - 07/21/17 1945      PT LONG TERM GOAL #1   Title  Patient will improve MODI score to 40% or better demonstrating improved function with daily tasks and able to stand, sit and walk with less difficulty    Baseline  MODI 55%     Status  New    Target Date  08/11/17      PT LONG TERM GOAL #2   Title  Patient will ben independent with home program for pain control and progressive exercises for flexibility and strength in lumbar spine/right LE  in order to transition to home program     Baseline  limited knowldge of appropriate exercises and progression without cuing, instruction    Status  New    Target Date  09/01/17      PT LONG TERM GOAL #3   Title  Patient will improve MODI score to 30% or better demonstrating improved function with daily tasks and able to stand, sit and walk with less difficulty    Baseline  MODI 55%    Status  New    Target Date  09/01/17            Plan - 09/01/17 1642    Clinical Impression Statement  Patient reported decreased pain and feeling "more relaxed" at end of session. She continues with pain limting function and is having a procedure for pain control this Wednesday and will return for further physical therapy  intervention following this per MD protocol.     Rehab Potential  Fair    Clinical Impairments Affecting Rehab Potential  (+) motivated (-) muliple co morbidities, chronic condition  PT Frequency  2x / week    PT Duration  6 weeks    PT Treatment/Interventions  Electrical Stimulation;Moist Heat;Ultrasound;Patient/family education;Neuromuscular re-education;Therapeutic exercise;Manual techniques    PT Next Visit Plan  pain control, ther. ex, manual therapy    PT Home Exercise Plan  stabilization exercises; observe good body mechanics for daily tasks       Patient will benefit from skilled therapeutic intervention in order to improve the following deficits and impairments:  Decreased strength, Impaired flexibility, Decreased activity tolerance, Impaired perceived functional ability, Pain, Decreased endurance, Difficulty walking  Visit Diagnosis: Other muscle spasm  Chronic bilateral low back pain with sciatica, sciatica laterality unspecified     Problem List Patient Active Problem List   Diagnosis Date Noted  . Assistance needed with transportation 05/26/2017  . Financial difficulties 05/26/2017  . Needs assistance with community resources 05/26/2017  . Moderate recurrent major depression (HCC) 05/26/2017  . Controlled substance agreement broken 10/11/2016  . Breast cancer screening 06/18/2016  . Knee pain 04/03/2016  . Hyponatremia 03/01/2016  . High triglycerides 12/24/2015  . Pes planus of both feet 11/16/2015  . Plantar fasciitis of right foot 11/16/2015  . Heel spur 11/16/2015  . Diabetic neuropathy (HCC) 11/16/2015  . Right ankle pain 11/16/2015  . Medication monitoring encounter 11/16/2015  . Diabetes mellitus without complication (HCC) 08/15/2015  . Need for Tdap vaccination 08/15/2015  . Chronic pain of multiple joints 08/15/2015  . Abnormal mammogram of right breast 06/19/2015  . Cerumen impaction 03/16/2015  . Hypertension goal BP (blood pressure) < 140/90  12/14/2014  . Hyperlipidemia LDL goal <100 12/14/2014  . History of transfusion of packed RBC 12/14/2014  . COPD, mild (HCC) 12/14/2014  . Hx of smoking 12/14/2014  . Status post bariatric surgery 12/14/2014  . Morbid obesity (HCC) 12/14/2014  . Chronic radicular lumbar pain 12/14/2014  . History of GI bleed 11/12/2014  . GERD without esophagitis 11/12/2014  . History of small bowel obstruction 09/07/2014    Beacher MayBrooks, Marie PT 09/02/2017, 9:38 AM  [place] [facility] [address] [place], [place], [address] Phone: [phone]   Fax:  [phone]  Name: Andrea Holden MRN: [id number] Date of Birth: Jan 02, 1946

## 2017-09-02 ENCOUNTER — Encounter: Payer: Self-pay | Admitting: Family Medicine

## 2017-09-02 NOTE — Telephone Encounter (Signed)
But also wants to see if you will do an appeal letter to get her belviq approved?

## 2017-09-02 NOTE — Telephone Encounter (Signed)
Request received for metoprolol; per Nov note: "it turns out they stopped her metoprolol" Rx for metoprolol declined I'll be glad to [facility]

## 2017-09-02 NOTE — Telephone Encounter (Signed)
Pt no longer on metoprolol

## 2017-09-03 ENCOUNTER — Encounter: Payer: PPO | Admitting: Physical Therapy

## 2017-09-03 DIAGNOSIS — M5417 Radiculopathy, lumbosacral region: Secondary | ICD-10-CM | POA: Diagnosis not present

## 2017-09-04 ENCOUNTER — Ambulatory Visit: Payer: PPO | Admitting: Physical Therapy

## 2017-09-08 ENCOUNTER — Ambulatory Visit: Payer: Medicare PPO | Admitting: Family Medicine

## 2017-09-09 ENCOUNTER — Ambulatory Visit: Payer: PPO | Admitting: Physical Therapy

## 2017-09-09 ENCOUNTER — Encounter: Payer: Self-pay | Admitting: Physical Therapy

## 2017-09-09 DIAGNOSIS — G8929 Other chronic pain: Secondary | ICD-10-CM

## 2017-09-09 DIAGNOSIS — M62838 Other muscle spasm: Secondary | ICD-10-CM

## 2017-09-09 DIAGNOSIS — M6281 Muscle weakness (generalized): Secondary | ICD-10-CM

## 2017-09-09 DIAGNOSIS — M5440 Lumbago with sciatica, unspecified side: Secondary | ICD-10-CM

## 2017-09-10 ENCOUNTER — Encounter: Payer: Self-pay | Admitting: Physical Therapy

## 2017-09-10 ENCOUNTER — Ambulatory Visit: Payer: PPO | Admitting: Physical Therapy

## 2017-09-10 DIAGNOSIS — M5440 Lumbago with sciatica, unspecified side: Secondary | ICD-10-CM

## 2017-09-10 DIAGNOSIS — M6281 Muscle weakness (generalized): Secondary | ICD-10-CM

## 2017-09-10 DIAGNOSIS — M62838 Other muscle spasm: Secondary | ICD-10-CM | POA: Diagnosis not present

## 2017-09-10 DIAGNOSIS — G8929 Other chronic pain: Secondary | ICD-10-CM

## 2017-09-10 NOTE — Therapy (Signed)
[facility] [address], [place], [address] Phone: [phone]   Fax:  [phone]  Physical Therapy Treatment  Patient Details  Name: Andrea Holden MRN: [id number] Date of Birth: 04-May-1946 Referring Provider: Rhys Martini DO   Encounter Date: 09/09/2017  PT End of Session - 09/09/17 1354    Visit Number  13    Number of Visits  24    Date for PT Re-Evaluation  10/21/17    PT Start Time  1351    PT Stop Time  1418    PT Time Calculation (min)  27 min    Activity Tolerance  Patient tolerated treatment well    Behavior During Therapy  [facility] for tasks assessed/performed       Past Medical History:  Diagnosis Date  . Allergy    seasonal allergies  . Anemia   . Anxiety   . Arthritis   . Asthma    allergy induced asthma  . Cataract   . Clotting disorder ([place])    history of blood clots  . Complication of anesthesia    arrhythmia following colonoscopy  . Degenerative disc disease, lumbar   . Degenerative disc disease, lumbar   . Depression   . Diabetes mellitus   . Diabetic neuropathy ([place]) 11/16/2015  . Dysrhythmia   . GERD (gastroesophageal reflux disease)   . H/O eating disorder   . H/O transfusion    patient was given 5 units of blood while at [place], blood type O+  . History of chicken pox   . History of hiatal hernia   . History of measles, mumps, or rubella   . HOH (hard of hearing)   . Hyperlipidemia   . Hypertension   . Irregular heartbeat   . Moderate COPD (chronic obstructive pulmonary disease) ([place]) 12/14/2014  . Neuropathy   . Opiate use 11/16/2015  . Orthopnea   . Pes planus of both feet 11/16/2015  . Plantar fasciitis of right foot 11/16/2015  . Wheezing     Past Surgical History:  Procedure Laterality Date  . ABDOMINAL HYSTERECTOMY    . CATARACT EXTRACTION W/PHACO Right 05/30/2015   Procedure: CATARACT EXTRACTION PHACO AND INTRAOCULAR LENS PLACEMENT (IOC);   Surgeon: Birder Robson, MD;  Location: [facility] ORS;  Service: Ophthalmology;  Laterality: Right;  [place] 00:57 AP% 20.9 CDE 11.99 fluid pack lot #[id number]  . CHOLECYSTECTOMY    . COLON SURGERY     blocked colon  . FOOT FUSION Right 2018  . GALLBLADDER SURGERY    . GASTRIC BYPASS  2010  . internal bleeding  2016  . MOUTH SURGERY    . OVARY SURGERY    . SKIN GRAFT Right 2018   RT foot  . TENOTOMY ACHILLES TENDON     Percuntaneous  . TONSILLECTOMY      There were no vitals filed for this visit.  Subjective Assessment - 09/09/17 1355    Subjective  Patient reports since injection she is able to stand and walk with less difficulty.     Pertinent History  chronic back pain and foot pain over many years since 1995 with previous physical therapy with good results. Patient reports falling 06/02/17 which caused increased back pain and she is now waing to have evaluation by spine specialist    Limitations  Sitting;Standing;Walking;House hold activities    How long can you sit comfortably?  <1 hour    How long can you stand comfortably?  <  20 min    How long can you walk comfortably?  only short distances    Patient Stated Goals  Patient wants to be abe to walk more normally with less pain in back to return to normal activities     Currently in Pain?  Yes    Pain Score  5     Pain Location  Back    Pain Orientation  Lower    Pain Descriptors / Indicators  Aching;Spasm;Tightness;Sore    Pain Type  Chronic pain    Pain Onset  More than a month ago    Pain Frequency  Intermittent       Objective:  Palpation: point tender lower back right and left, mild spasms palpable along right lumbar spine  Transfers on and off massage chair with close supervision and assistance of therapist to stabilize chair  Gait: mild antalgic, using Hurry cane for support and close supervision x 1 for safety  Treatment:  Manual Therapy: 25 min. Goal: spasm, pain STM seated in massage chair, concentration right  side and bilateral lower lumbar regionsuperficial and deep techniques  Patient response to treatment: Patient reports decreased stiffness and improved soft tissue elasticity by 50% and able to ambulate with less difficulty and improved stability.               PT Education - 09/09/17 1430    Education provided  Yes    Education Details  HEP for stabilization seated, walking for exercise    Person(s) Educated  Patient    Methods  Explanation    Comprehension  Verbalized understanding          PT Long Term Goals - 09/09/17 1430      PT LONG TERM GOAL #1   Title  Patient will improve MODI score to 40% or better demonstrating improved function with daily tasks and able to stand, sit and walk with less difficulty    Baseline  MODI 55%; 45% 09/09/17    Status  Partially Met      PT LONG TERM GOAL #2   Title  Patient will ben independent with home program for pain control and progressive exercises for flexibility and strength in lumbar spine/right LE  in order to transition to home program     Baseline  limited knowldge of appropriate exercises and progression without cuing, instruction    Status  Revised    Target Date  10/21/17      PT LONG TERM GOAL #3   Title  Patient will improve MODI score to 30% or better demonstrating improved function with daily tasks and able to stand, sit and walk with less difficulty    Baseline  MODI 55%    Status  Revised    Target Date  10/21/17            Plan - 09/09/17 1430    Clinical Impression Statement  Patient is doing well s/p injection with improved posture and decreasing spasm and pain in back. She continues with decreased core control, strength and limitations with ambulation and daily tasks. She responded well to [facility] and should be able to begin light exercises, transitioning to more advanced strengthening over the next week.     Rehab Potential  Fair    Clinical Impairments Affecting Rehab Potential  (+) motivated (-)  muliple co morbidities, chronic condition    PT Frequency  2x / week    PT Duration  6 weeks    PT Treatment/Interventions  Electrical Stimulation;Moist Heat;Ultrasound;Patient/family education;Neuromuscular re-education;Therapeutic exercise;Manual techniques    PT Next Visit Plan  pain control, ther. ex, manual therapy    PT Home Exercise Plan  stabilization exercises; observe good body mechanics for daily tasks    Consulted and Agree with Plan of Care  Patient       Patient will benefit from skilled therapeutic intervention in order to improve the following deficits and impairments:  Decreased strength, Impaired flexibility, Decreased activity tolerance, Impaired perceived functional ability, Pain, Decreased endurance, Difficulty walking  Visit Diagnosis: Other muscle spasm - Plan: PT plan of care cert/re-cert  Chronic bilateral low back pain with sciatica, sciatica laterality unspecified - Plan: PT plan of care cert/re-cert  Muscle weakness (generalized) - Plan: PT plan of care cert/re-cert     Problem List Patient Active Problem List   Diagnosis Date Noted  . Assistance needed with transportation 05/26/2017  . Financial difficulties 05/26/2017  . Needs assistance with community resources 05/26/2017  . Moderate recurrent major depression ([place]) 05/26/2017  . Controlled substance agreement broken 10/11/2016  . Breast cancer screening 06/18/2016  . Knee pain 04/03/2016  . Hyponatremia 03/01/2016  . High triglycerides 12/24/2015  . Pes planus of both feet 11/16/2015  . Plantar fasciitis of right foot 11/16/2015  . Heel spur 11/16/2015  . Diabetic neuropathy ([place]) 11/16/2015  . Right ankle pain 11/16/2015  . Medication monitoring encounter 11/16/2015  . Diabetes mellitus without complication ([place]) 16/96/7893  . Need for Tdap vaccination 08/15/2015  . Chronic pain of multiple joints 08/15/2015  . Abnormal mammogram of right breast 06/19/2015  . Cerumen impaction 03/16/2015   . Hypertension goal BP (blood pressure) < 140/90 12/14/2014  . Hyperlipidemia LDL goal <100 12/14/2014  . History of transfusion of packed RBC 12/14/2014  . COPD, mild ([place]) 12/14/2014  . Hx of smoking 12/14/2014  . Status post bariatric surgery 12/14/2014  . Morbid obesity ([place]) 12/14/2014  . Chronic radicular lumbar pain 12/14/2014  . History of GI bleed 11/12/2014  . GERD without esophagitis 11/12/2014  . History of small bowel obstruction 09/07/2014    Jomarie Longs PT 09/10/2017, 12:55 PM  [facility] [address], [place], [address] Phone: [phone]   Fax:  [phone]  Name: BUSHRA DENMAN MRN: [id number] Date of Birth: 1946-01-05

## 2017-09-10 NOTE — Therapy (Signed)
[facility] [address] [place], [address] Phone: [phone]   Fax:  [phone]  Physical Therapy Treatment  Patient Details  Name: Andrea Holden MRN: [id number] Date of Birth: [birth year] Referring Provider: Rhys Martini DO   Encounter Date: 09/10/2017  PT End of Session - 09/10/17 1230    Visit Number  14    Number of Visits  24    Date for PT Re-Evaluation  10/21/17    PT Start Time  5625    PT Stop Time  1216    PT Time Calculation (min)  31 min    Activity Tolerance  Patient tolerated treatment well    Behavior During Therapy  [facility] for tasks assessed/performed       Past Medical History:  Diagnosis Date  . Allergy    seasonal allergies  . Anemia   . Anxiety   . Arthritis   . Asthma    allergy induced asthma  . Cataract   . Clotting disorder ([place])    history of blood clots  . Complication of anesthesia    arrhythmia following colonoscopy  . Degenerative disc disease, lumbar   . Degenerative disc disease, lumbar   . Depression   . Diabetes mellitus   . Diabetic neuropathy ([place]) 11/16/2015  . Dysrhythmia   . GERD (gastroesophageal reflux disease)   . H/O eating disorder   . H/O transfusion    patient was given 5 units of blood while at [place], blood type O+  . History of chicken pox   . History of hiatal hernia   . History of measles, mumps, or rubella   . HOH (hard of hearing)   . Hyperlipidemia   . Hypertension   . Irregular heartbeat   . Moderate COPD (chronic obstructive pulmonary disease) ([place]) 12/14/2014  . Neuropathy   . Opiate use 11/16/2015  . Orthopnea   . Pes planus of both feet 11/16/2015  . Plantar fasciitis of right foot 11/16/2015  . Wheezing     Past Surgical History:  Procedure Laterality Date  . ABDOMINAL HYSTERECTOMY    . CATARACT EXTRACTION W/PHACO Right 05/30/2015   Procedure: CATARACT EXTRACTION PHACO AND INTRAOCULAR LENS PLACEMENT (IOC);   Surgeon: Birder Robson, MD;  Location: [facility] ORS;  Service: Ophthalmology;  Laterality: Right;  [place] 00:57 AP% 20.9 CDE 11.99 fluid pack lot #[id number]  . CHOLECYSTECTOMY    . COLON SURGERY     blocked colon  . FOOT FUSION Right 2018  . GALLBLADDER SURGERY    . GASTRIC BYPASS  2010  . internal bleeding  2016  . MOUTH SURGERY    . OVARY SURGERY    . SKIN GRAFT Right 2018   RT foot  . TENOTOMY ACHILLES TENDON     Percuntaneous  . TONSILLECTOMY      There were no vitals filed for this visit.  Subjective Assessment - 09/10/17 1156    Subjective  Patient continues with improvement since injection. Her c/c today is lower back pain right > left side     Pertinent History  chronic back pain and foot pain over many years since 1995 with previous physical therapy with good results. Patient reports falling 06/02/17 which caused increased back pain and she is now waing to have evaluation by spine specialist    Limitations  Sitting;Standing;Walking;House hold activities    How long can you sit comfortably?  <1 hour    How long can you  stand comfortably?  <20 min    How long can you walk comfortably?  only short distances    Patient Stated Goals  Patient wants to be abe to walk more normally with less pain in back to return to normal activities     Currently in Pain?  Yes    Pain Score  4     Pain Location  Back    Pain Orientation  Lower    Pain Descriptors / Indicators  Aching;Spasm    Pain Type  Chronic pain    Pain Onset  More than a month ago    Pain Frequency  Intermittent       Objective:  Palpation: mild spasms along bilateral paraspinal muscles thoracic spine to lumbar spine Transfers on and off massage chair with close supervision and assistance of therapist to stabilize chair  Gait: mild antalgic, using Hurry cane for support and close supervision x 1 for safety  Treatment:  Manual Therapy: 4mn. Goal: spasm, pain STM seated in massage chair, performed to back from  thoracic to lumbar region with concentration on right side and bilateral lower lumbar regionsuperficial and deep techniques  Patient response to treatment:Patient with improved soft tissue elasticity, decreased spasms by up to 50% with treatment.       PT Education - 09/10/17 1220    Education provided  Yes    Education Details  HEP re assessed    Person(s) Educated  Patient    Methods  Explanation    Comprehension  Verbalized understanding          PT Long Term Goals - 09/09/17 1430      PT LONG TERM GOAL #1   Title  Patient will improve MODI score to 40% or better demonstrating improved function with daily tasks and able to stand, sit and walk with less difficulty    Baseline  MODI 55%; 45% 09/09/17    Status  Partially Met      PT LONG TERM GOAL #2   Title  Patient will ben independent with home program for pain control and progressive exercises for flexibility and strength in lumbar spine/right LE  in order to transition to home program     Baseline  limited knowldge of appropriate exercises and progression without cuing, instruction    Status  Revised    Target Date  10/21/17      PT LONG TERM GOAL #3   Title  Patient will improve MODI score to 30% or better demonstrating improved function with daily tasks and able to stand, sit and walk with less difficulty    Baseline  MODI 55%    Status  Revised    Target Date  10/21/17            Plan - 09/10/17 1230    Clinical Impression Statement  Patient is progressing with goals with decreasing spasms and pain and improving posture and ambulation with less difficulty.     Rehab Potential  Fair    Clinical Impairments Affecting Rehab Potential  (+) motivated (-) muliple co morbidities, chronic condition    PT Frequency  2x / week    PT Duration  6 weeks    PT Treatment/Interventions  Electrical Stimulation;Moist Heat;Ultrasound;Patient/family education;Neuromuscular re-education;Therapeutic exercise;Manual  techniques    PT Next Visit Plan  pain control, ther. ex, manual therapy    PT Home Exercise Plan  stabilization exercises; observe good body mechanics for daily tasks       Patient will  benefit from skilled therapeutic intervention in order to improve the following deficits and impairments:  Decreased strength, Impaired flexibility, Decreased activity tolerance, Impaired perceived functional ability, Pain, Decreased endurance, Difficulty walking  Visit Diagnosis: Other muscle spasm  Chronic bilateral low back pain with sciatica, sciatica laterality unspecified  Muscle weakness (generalized)     Problem List Patient Active Problem List   Diagnosis Date Noted  . Assistance needed with transportation 05/26/2017  . Financial difficulties 05/26/2017  . Needs assistance with community resources 05/26/2017  . Moderate recurrent major depression ([place]) 05/26/2017  . Controlled substance agreement broken 10/11/2016  . Breast cancer screening 06/18/2016  . Knee pain 04/03/2016  . Hyponatremia 03/01/2016  . High triglycerides 12/24/2015  . Pes planus of both feet 11/16/2015  . Plantar fasciitis of right foot 11/16/2015  . Heel spur 11/16/2015  . Diabetic neuropathy ([place]) 11/16/2015  . Right ankle pain 11/16/2015  . Medication monitoring encounter 11/16/2015  . Diabetes mellitus without complication ([place]) 46/43/1427  . Need for Tdap vaccination 08/15/2015  . Chronic pain of multiple joints 08/15/2015  . Abnormal mammogram of right breast 06/19/2015  . Cerumen impaction 03/16/2015  . Hypertension goal BP (blood pressure) < 140/90 12/14/2014  . Hyperlipidemia LDL goal <100 12/14/2014  . History of transfusion of packed RBC 12/14/2014  . COPD, mild ([place]) 12/14/2014  . Hx of smoking 12/14/2014  . Status post bariatric surgery 12/14/2014  . Morbid obesity ([place]) 12/14/2014  . Chronic radicular lumbar pain 12/14/2014  . History of GI bleed 11/12/2014  . GERD without esophagitis  11/12/2014  . History of small bowel obstruction 09/07/2014    Jomarie Longs PT 09/11/2017, 8:05 AM  [facility] [address], [place], [address] Phone: [phone]   Fax:  [phone]  Name: Andrea Holden MRN: [id number] Date of Birth: [birth year]

## 2017-09-11 ENCOUNTER — Ambulatory Visit: Payer: Medicare PPO | Admitting: Family Medicine

## 2017-09-11 DIAGNOSIS — M48061 Spinal stenosis, lumbar region without neurogenic claudication: Secondary | ICD-10-CM | POA: Diagnosis not present

## 2017-09-15 ENCOUNTER — Ambulatory Visit: Payer: PPO | Admitting: Physical Therapy

## 2017-09-15 ENCOUNTER — Telehealth: Payer: Self-pay | Admitting: Family Medicine

## 2017-09-15 ENCOUNTER — Encounter: Payer: Self-pay | Admitting: Physical Therapy

## 2017-09-15 DIAGNOSIS — M62838 Other muscle spasm: Secondary | ICD-10-CM

## 2017-09-15 DIAGNOSIS — G8929 Other chronic pain: Secondary | ICD-10-CM

## 2017-09-15 DIAGNOSIS — M6281 Muscle weakness (generalized): Secondary | ICD-10-CM

## 2017-09-15 DIAGNOSIS — M5440 Lumbago with sciatica, unspecified side: Secondary | ICD-10-CM

## 2017-09-15 NOTE — Telephone Encounter (Signed)
She has not been seen for over 3 months My understanding is that insurance would not cover this drug Please have her come in for an appointment to discuss obesity

## 2017-09-15 NOTE — Telephone Encounter (Signed)
Copied from CRM [phone]. Topic: Quick Communication - Office Called Patient >> Sep 12, 2017 10:07 AM Marcos EkeGraves, Jamie, CMA wrote: Reason for CRM: Call us back to let us know where she wants the belviq rx sent to? pharmacy >> Sep 12, 2017 10:22 AM Maia Pettiesrtiz, Kristie S wrote: Pt returned call. [facility] [address] - [place], [place] - [address] [phone] (Phone) [phone])    >> Sep 15, 2017  2:44 PM Crist InfanteHarrald, Kathy J wrote: Pt following up on request for belviq.

## 2017-09-15 NOTE — Telephone Encounter (Signed)
Please schedule pt for appt  Thanks!

## 2017-09-15 NOTE — Telephone Encounter (Signed)
Please see below and advise. Thanks

## 2017-09-15 NOTE — Telephone Encounter (Signed)
Copied from CRM [phone]. Topic: Quick Communication - Office Called Patient >> Sep 12, 2017 10:07 AM Marcos EkeGraves, Jamie, CMA wrote: Reason for CRM: Call us back to let us know where she wants the belviq rx sent to? pharmacy >> Sep 12, 2017 10:22 AM Maia Pettiesrtiz, Kristie S wrote: Pt returned call. [facility] to: [facility] [address] - [place], [place] - [address] [phone] (Phone) [phone] (Fax)    Pt following up on refill request for belviq

## 2017-09-16 NOTE — Therapy (Signed)
[facility] [address] [place], [address] Phone: [phone]   Fax:  [phone]  Physical Therapy Treatment  Patient Details  Name: DESERE GWIN MRN: [id number] Date of Birth: 08-19-1945 Referring Provider: Rhys Martini DO   Encounter Date: 09/15/2017  PT End of Session - 09/15/17 1034    Visit Number  15    Number of Visits  24    Date for PT Re-Evaluation  10/21/17    PT Start Time  1030    PT Stop Time  1100    PT Time Calculation (min)  30 min    Activity Tolerance  Patient tolerated treatment well    Behavior During Therapy  [facility] for tasks assessed/performed       Past Medical History:  Diagnosis Date  . Allergy    seasonal allergies  . Anemia   . Anxiety   . Arthritis   . Asthma    allergy induced asthma  . Cataract   . Clotting disorder ([place])    history of blood clots  . Complication of anesthesia    arrhythmia following colonoscopy  . Degenerative disc disease, lumbar   . Degenerative disc disease, lumbar   . Depression   . Diabetes mellitus   . Diabetic neuropathy ([place]) 11/16/2015  . Dysrhythmia   . GERD (gastroesophageal reflux disease)   . H/O eating disorder   . H/O transfusion    patient was given 5 units of blood while at [place], blood type O+  . History of chicken pox   . History of hiatal hernia   . History of measles, mumps, or rubella   . HOH (hard of hearing)   . Hyperlipidemia   . Hypertension   . Irregular heartbeat   . Moderate COPD (chronic obstructive pulmonary disease) ([place]) 12/14/2014  . Neuropathy   . Opiate use 11/16/2015  . Orthopnea   . Pes planus of both feet 11/16/2015  . Plantar fasciitis of right foot 11/16/2015  . Wheezing     Past Surgical History:  Procedure Laterality Date  . ABDOMINAL HYSTERECTOMY    . CATARACT EXTRACTION W/PHACO Right 05/30/2015   Procedure: CATARACT EXTRACTION PHACO AND INTRAOCULAR LENS PLACEMENT (IOC);   Surgeon: Birder Robson, MD;  Location: [facility] ORS;  Service: Ophthalmology;  Laterality: Right;  [place] 00:57 AP% 20.9 CDE 11.99 fluid pack lot #[id number]  . CHOLECYSTECTOMY    . COLON SURGERY     blocked colon  . FOOT FUSION Right 2018  . GALLBLADDER SURGERY    . GASTRIC BYPASS  2010  . internal bleeding  2016  . MOUTH SURGERY    . OVARY SURGERY    . SKIN GRAFT Right 2018   RT foot  . TENOTOMY ACHILLES TENDON     Percuntaneous  . TONSILLECTOMY      There were no vitals filed for this visit.  Subjective Assessment - 09/15/17 1032    Subjective  Getting a procedure on Wednesday for back. patinet reports getting sore following previous session and then was okay.     Pertinent History  chronic back pain and foot pain over many years since 1995 with previous physical therapy with good results. Patient reports falling 06/02/17 which caused increased back pain and she is now waing to have evaluation by spine specialist    Limitations  Sitting;Standing;Walking;House hold activities    How long can you sit comfortably?  <1 hour    How long can  you stand comfortably?  <20 min    How long can you walk comfortably?  only short distances    Patient Stated Goals  Patient wants to be abe to walk more normally with less pain in back to return to normal activities     Currently in Pain?  Yes    Pain Score  3     Pain Location  Back    Pain Orientation  Lower centrally located    Pain Descriptors / Indicators  Aching    Pain Type  Chronic pain    Pain Onset  More than a month ago    Pain Frequency  Intermittent         Objective:  Palpation: mild spasms along bilateral paraspinal muscles thoracic spine to lumbar spine  Transfers on and off massage chair with close supervision and assistance of therapist to stabilize chair  Gait: mild antalgic, using Hurry cane for support and close supervision x 1 for safety; improved posture from previous session   Treatment:   Manual Therapy: 26 min.  Goal: spasm, pain STM seated in massage chair, performed to back from thoracic to lumbar region with concentration on right side and bilateral lower lumbar region superficial and deep techniques   Patient response to treatment: improved soft tissue elasticity with decreased tenderness to palpation by 50%. improved transfer off of massage chair and gait with decreased limp at end of session. Pain level < 3/10 at end of session.       PT Education - 09/15/17 1033    Education provided  Yes    Education Details  HEP to continue: core sitting exercises    Person(s) Educated  Patient    Methods  Explanation    Comprehension  Verbalized understanding          PT Long Term Goals - 09/09/17 1430      PT LONG TERM GOAL #1   Title  Patient will improve MODI score to 40% or better demonstrating improved function with daily tasks and able to stand, sit and walk with less difficulty    Baseline  MODI 55%; 45% 09/09/17    Status  Partially Met      PT LONG TERM GOAL #2   Title  Patient will ben independent with home program for pain control and progressive exercises for flexibility and strength in lumbar spine/right LE  in order to transition to home program     Baseline  limited knowldge of appropriate exercises and progression without cuing, instruction    Status  Revised    Target Date  10/21/17      PT LONG TERM GOAL #3   Title  Patient will improve MODI score to 30% or better demonstrating improved function with daily tasks and able to stand, sit and walk with less difficulty    Baseline  MODI 55%    Status  Revised    Target Date  10/21/17            Plan - 09/15/17 1110    Clinical Impression Statement  Limited session: patient arrived 15 min late: Patient has more centralized symptoms today and is preparing for another injection this week. She is responding well to soft tissue mobilization for pain and spasms and was able to ambulate with more erect posture, improved balance  less right hip symptoms.     Rehab Potential  Fair    Clinical Impairments Affecting Rehab Potential  (+) motivated (-) muliple co morbidities, chronic  condition    PT Frequency  2x / week    PT Duration  6 weeks    PT Treatment/Interventions  Electrical Stimulation;Moist Heat;Ultrasound;Patient/family education;Neuromuscular re-education;Therapeutic exercise;Manual techniques    PT Next Visit Plan  pain control, ther. ex, manual therapy    PT Home Exercise Plan  stabilization exercises; observe good body mechanics for daily tasks       Patient will benefit from skilled therapeutic intervention in order to improve the following deficits and impairments:  Decreased strength, Impaired flexibility, Decreased activity tolerance, Impaired perceived functional ability, Pain, Decreased endurance, Difficulty walking  Visit Diagnosis: Other muscle spasm  Chronic bilateral low back pain with sciatica, sciatica laterality unspecified  Muscle weakness (generalized)     Problem List Patient Active Problem List   Diagnosis Date Noted  . Assistance needed with transportation 05/26/2017  . Financial difficulties 05/26/2017  . Needs assistance with community resources 05/26/2017  . Moderate recurrent major depression ([place]) 05/26/2017  . Controlled substance agreement broken 10/11/2016  . Breast cancer screening 06/18/2016  . Knee pain 04/03/2016  . Hyponatremia 03/01/2016  . High triglycerides 12/24/2015  . Pes planus of both feet 11/16/2015  . Plantar fasciitis of right foot 11/16/2015  . Heel spur 11/16/2015  . Diabetic neuropathy ([place]) 11/16/2015  . Right ankle pain 11/16/2015  . Medication monitoring encounter 11/16/2015  . Diabetes mellitus without complication ([place]) 44/31/5400  . Need for Tdap vaccination 08/15/2015  . Chronic pain of multiple joints 08/15/2015  . Abnormal mammogram of right breast 06/19/2015  . Cerumen impaction 03/16/2015  . Hypertension goal BP (blood pressure)  < 140/90 12/14/2014  . Hyperlipidemia LDL goal <100 12/14/2014  . History of transfusion of packed RBC 12/14/2014  . COPD, mild ([place]) 12/14/2014  . Hx of smoking 12/14/2014  . Status post bariatric surgery 12/14/2014  . Morbid obesity ([place]) 12/14/2014  . Chronic radicular lumbar pain 12/14/2014  . History of GI bleed 11/12/2014  . GERD without esophagitis 11/12/2014  . History of small bowel obstruction 09/07/2014    Jomarie Longs PT 09/16/2017, 7:35 AM  [facility] [address], [place], [address] Phone: [phone]   Fax:  [phone]  Name: ANYLAH SCHEIB MRN: [id number] Date of Birth: January 10, 1946

## 2017-09-16 NOTE — Telephone Encounter (Signed)
Pt has an appt on 09/23/17

## 2017-09-17 ENCOUNTER — Encounter: Payer: PPO | Admitting: Physical Therapy

## 2017-09-17 DIAGNOSIS — M47816 Spondylosis without myelopathy or radiculopathy, lumbar region: Secondary | ICD-10-CM | POA: Diagnosis not present

## 2017-09-18 ENCOUNTER — Encounter: Payer: Self-pay | Admitting: Physical Therapy

## 2017-09-18 ENCOUNTER — Ambulatory Visit: Payer: PPO | Admitting: Physical Therapy

## 2017-09-18 DIAGNOSIS — M62838 Other muscle spasm: Secondary | ICD-10-CM | POA: Diagnosis not present

## 2017-09-18 DIAGNOSIS — G8929 Other chronic pain: Secondary | ICD-10-CM

## 2017-09-18 DIAGNOSIS — M6281 Muscle weakness (generalized): Secondary | ICD-10-CM

## 2017-09-18 DIAGNOSIS — M5440 Lumbago with sciatica, unspecified side: Secondary | ICD-10-CM

## 2017-09-19 NOTE — Therapy (Signed)
[facility] [address], [place], [address] Phone: [phone]   Fax:  [phone]  Physical Therapy Treatment  Patient Details  Name: Andrea Holden MRN: [id number] Date of Birth: [birth year] Referring Provider: Rhys Martini DO   Encounter Date: 09/18/2017  PT End of Session - 09/18/17 1122    Visit Number  16    Number of Visits  24    Date for PT Re-Evaluation  10/21/17    PT Start Time  1039    PT Stop Time  1115    PT Time Calculation (min)  36 min    Activity Tolerance  Patient tolerated treatment well    Behavior During Therapy  [facility] for tasks assessed/performed       Past Medical History:  Diagnosis Date  . Allergy    seasonal allergies  . Anemia   . Anxiety   . Arthritis   . Asthma    allergy induced asthma  . Cataract   . Clotting disorder ([place])    history of blood clots  . Complication of anesthesia    arrhythmia following colonoscopy  . Degenerative disc disease, lumbar   . Degenerative disc disease, lumbar   . Depression   . Diabetes mellitus   . Diabetic neuropathy ([place]) 11/16/2015  . Dysrhythmia   . GERD (gastroesophageal reflux disease)   . H/O eating disorder   . H/O transfusion    patient was given 5 units of blood while at [place], blood type O+  . History of chicken pox   . History of hiatal hernia   . History of measles, mumps, or rubella   . HOH (hard of hearing)   . Hyperlipidemia   . Hypertension   . Irregular heartbeat   . Moderate COPD (chronic obstructive pulmonary disease) ([place]) 12/14/2014  . Neuropathy   . Opiate use 11/16/2015  . Orthopnea   . Pes planus of both feet 11/16/2015  . Plantar fasciitis of right foot 11/16/2015  . Wheezing     Past Surgical History:  Procedure Laterality Date  . ABDOMINAL HYSTERECTOMY    . CATARACT EXTRACTION W/PHACO Right 05/30/2015   Procedure: CATARACT EXTRACTION PHACO AND INTRAOCULAR LENS PLACEMENT (IOC);   Surgeon: Birder Robson, MD;  Location: [facility] ORS;  Service: Ophthalmology;  Laterality: Right;  [place] 00:57 AP% 20.9 CDE 11.99 fluid pack lot #[id number]  . CHOLECYSTECTOMY    . COLON SURGERY     blocked colon  . FOOT FUSION Right 2018  . GALLBLADDER SURGERY    . GASTRIC BYPASS  2010  . internal bleeding  2016  . MOUTH SURGERY    . OVARY SURGERY    . SKIN GRAFT Right 2018   RT foot  . TENOTOMY ACHILLES TENDON     Percuntaneous  . TONSILLECTOMY      There were no vitals filed for this visit.  Subjective Assessment - 09/18/17 1040    Subjective  Patient underwent injections yesterday for back pain and is here for STM.     Pertinent History  chronic back pain and foot pain over many years since 1995 with previous physical therapy with good results. Patient reports falling 06/02/17 which caused increased back pain and she is now waing to have evaluation by spine specialist    Limitations  Sitting;Standing;Walking;House hold activities    How long can you sit comfortably?  <1 hour    How long can you stand comfortably?  <20 min  How long can you walk comfortably?  only short distances    Patient Stated Goals  Patient wants to be abe to walk more normally with less pain in back to return to normal activities     Currently in Pain?  Yes    Pain Score  3     Pain Location  Back    Pain Orientation  Lower    Pain Descriptors / Indicators  Aching    Pain Type  Chronic pain    Pain Onset  More than a month ago    Pain Frequency  Intermittent         Objective:  Palpation: mild spasms bilateral paraspinal muscles lumbar spine, increased tone tenderness cervical spine, suboccipital muscles Transfers on and off massage chair with close supervision and assistance of therapist to stabilize chair  Gait: mild antalgic, using Hurry cane for support and close supervision x 1 for safety   Treatment: Manual Therapy: 28 min. Goal: spasm, pain STM performed to back from thoracic to lumbar  region with concentration on bilateral lower lumbar region superficial and deep techniques seated in massage chair   Patient response to treatment: improved soft tissue elasticity with decreased spasms and tenderness up to 50%      PT Education - 09/18/17 1045    Education provided  Yes    Education Details  exercise progression, use of heat/ice for pain and spasms    Person(s) Educated  Patient    Methods  Explanation    Comprehension  Verbalized understanding          PT Long Term Goals - 09/09/17 1430      PT LONG TERM GOAL #1   Title  Patient will improve MODI score to 40% or better demonstrating improved function with daily tasks and able to stand, sit and walk with less difficulty    Baseline  MODI 55%; 45% 09/09/17    Status  Partially Met      PT LONG TERM GOAL #2   Title  Patient will ben independent with home program for pain control and progressive exercises for flexibility and strength in lumbar spine/right LE  in order to transition to home program     Baseline  limited knowldge of appropriate exercises and progression without cuing, instruction    Status  Revised    Target Date  10/21/17      PT LONG TERM GOAL #3   Title  Patient will improve MODI score to 30% or better demonstrating improved function with daily tasks and able to stand, sit and walk with less difficulty    Baseline  MODI 55%    Status  Revised    Target Date  10/21/17            Plan - 09/18/17 1141    Clinical Impression Statement  Patient responded well to treatment with decreased tenderness and improved soft tissue mobility and decreased spasms. She should be ready to progress exercises next session.      Rehab Potential  Fair    Clinical Impairments Affecting Rehab Potential  (+) motivated (-) muliple co morbidities, chronic condition    PT Frequency  2x / week    PT Duration  6 weeks    PT Treatment/Interventions  Electrical Stimulation;Moist Heat;Ultrasound;Patient/family  education;Neuromuscular re-education;Therapeutic exercise;Manual techniques    PT Next Visit Plan  pain control, ther. ex, manual therapy    PT Home Exercise Plan  stabilization exercises; observe good body mechanics for daily  tasks       Patient will benefit from skilled therapeutic intervention in order to improve the following deficits and impairments:  Decreased strength, Impaired flexibility, Decreased activity tolerance, Impaired perceived functional ability, Pain, Decreased endurance, Difficulty walking  Visit Diagnosis: Other muscle spasm  Chronic bilateral low back pain with sciatica, sciatica laterality unspecified  Muscle weakness (generalized)     Problem List Patient Active Problem List   Diagnosis Date Noted  . Assistance needed with transportation 05/26/2017  . Financial difficulties 05/26/2017  . Needs assistance with community resources 05/26/2017  . Moderate recurrent major depression ([place]) 05/26/2017  . Controlled substance agreement broken 10/11/2016  . Breast cancer screening 06/18/2016  . Knee pain 04/03/2016  . Hyponatremia 03/01/2016  . High triglycerides 12/24/2015  . Pes planus of both feet 11/16/2015  . Plantar fasciitis of right foot 11/16/2015  . Heel spur 11/16/2015  . Diabetic neuropathy ([place]) 11/16/2015  . Right ankle pain 11/16/2015  . Medication monitoring encounter 11/16/2015  . Diabetes mellitus without complication ([place]) 63/84/5364  . Need for Tdap vaccination 08/15/2015  . Chronic pain of multiple joints 08/15/2015  . Abnormal mammogram of right breast 06/19/2015  . Cerumen impaction 03/16/2015  . Hypertension goal BP (blood pressure) < 140/90 12/14/2014  . Hyperlipidemia LDL goal <100 12/14/2014  . History of transfusion of packed RBC 12/14/2014  . COPD, mild ([place]) 12/14/2014  . Hx of smoking 12/14/2014  . Status post bariatric surgery 12/14/2014  . Morbid obesity ([place]) 12/14/2014  . Chronic radicular lumbar pain 12/14/2014  .  History of GI bleed 11/12/2014  . GERD without esophagitis 11/12/2014  . History of small bowel obstruction 09/07/2014    Jomarie Longs PT 09/19/2017, 10:48 AM  [facility] [address], [place], [address] Phone: [phone]   Fax:  [phone]  Name: Andrea Holden MRN: [id number] Date of Birth: [birth year]

## 2017-09-22 ENCOUNTER — Encounter: Payer: Self-pay | Admitting: Physical Therapy

## 2017-09-22 ENCOUNTER — Ambulatory Visit: Payer: PPO | Admitting: Physical Therapy

## 2017-09-22 DIAGNOSIS — M62838 Other muscle spasm: Secondary | ICD-10-CM

## 2017-09-22 DIAGNOSIS — M5440 Lumbago with sciatica, unspecified side: Secondary | ICD-10-CM

## 2017-09-22 DIAGNOSIS — M6281 Muscle weakness (generalized): Secondary | ICD-10-CM

## 2017-09-22 DIAGNOSIS — G8929 Other chronic pain: Secondary | ICD-10-CM

## 2017-09-22 NOTE — Therapy (Signed)
[facility] [address] [place], [address] Phone: [phone]   Fax:  [phone]  Physical Therapy Treatment  Patient Details  Name: LORALYN RACHEL MRN: [id number] Date of Birth: Oct 14, 1945 Referring Provider: Rhys Martini DO   Encounter Date: 09/22/2017  PT End of Session - 09/22/17 1556    Visit Number  17    Number of Visits  24    Date for PT Re-Evaluation  10/21/17    PT Start Time  1513    PT Stop Time  1550    PT Time Calculation (min)  37 min    Activity Tolerance  Patient tolerated treatment well    Behavior During Therapy  [facility] for tasks assessed/performed       Past Medical History:  Diagnosis Date  . Allergy    seasonal allergies  . Anemia   . Anxiety   . Arthritis   . Asthma    allergy induced asthma  . Cataract   . Clotting disorder ([place])    history of blood clots  . Complication of anesthesia    arrhythmia following colonoscopy  . Degenerative disc disease, lumbar   . Degenerative disc disease, lumbar   . Depression   . Diabetes mellitus   . Diabetic neuropathy ([place]) 11/16/2015  . Dysrhythmia   . GERD (gastroesophageal reflux disease)   . H/O eating disorder   . H/O transfusion    patient was given 5 units of blood while at [place], blood type O+  . History of chicken pox   . History of hiatal hernia   . History of measles, mumps, or rubella   . HOH (hard of hearing)   . Hyperlipidemia   . Hypertension   . Irregular heartbeat   . Moderate COPD (chronic obstructive pulmonary disease) ([place]) 12/14/2014  . Neuropathy   . Opiate use 11/16/2015  . Orthopnea   . Pes planus of both feet 11/16/2015  . Plantar fasciitis of right foot 11/16/2015  . Wheezing     Past Surgical History:  Procedure Laterality Date  . ABDOMINAL HYSTERECTOMY    . CATARACT EXTRACTION W/PHACO Right 05/30/2015   Procedure: CATARACT EXTRACTION PHACO AND INTRAOCULAR LENS PLACEMENT (IOC);   Surgeon: Birder Robson, MD;  Location: [facility] ORS;  Service: Ophthalmology;  Laterality: Right;  [place] 00:57 AP% 20.9 CDE 11.99 fluid pack lot #[id number]  . CHOLECYSTECTOMY    . COLON SURGERY     blocked colon  . FOOT FUSION Right 2018  . GALLBLADDER SURGERY    . GASTRIC BYPASS  2010  . internal bleeding  2016  . MOUTH SURGERY    . OVARY SURGERY    . SKIN GRAFT Right 2018   RT foot  . TENOTOMY ACHILLES TENDON     Percuntaneous  . TONSILLECTOMY      There were no vitals filed for this visit.  Subjective Assessment - 09/22/17 1512    Subjective  Patient reports she is looking forward to beginning exercise.     Pertinent History  chronic back pain and foot pain over many years since 1995 with previous physical therapy with good results. Patient reports falling 06/02/17 which caused increased back pain and she is now waing to have evaluation by spine specialist    Limitations  Sitting;Standing;Walking;House hold activities    How long can you sit comfortably?  <1 hour    How long can you stand comfortably?  <20 min  How long can you walk comfortably?  only short distances    Patient Stated Goals  Patient wants to be abe to walk more normally with less pain in back to return to normal activities     Currently in Pain?  Yes    Pain Score  3     Pain Location  Back    Pain Orientation  Lower    Pain Descriptors / Indicators  Aching    Pain Type  Chronic pain    Pain Onset  More than a month ago    Pain Frequency  Intermittent          Objective:  Palpation: mild spasms bilateral paraspinal muscles lumbar spine, increased tone tenderness cervical spine, suboccipital muscles Transfers on and off massage chair with close supervision and assistance of therapist to stabilize chair  Gait: independent; using Hurry cane for support and close supervision x 1 for safety   Treatment: Manual Therapy: 26 min. Goal: spasm, pain STM performed to back from thoracic to lumbar region with  concentration on bilateral lower lumbar region superficial and deep techniques seated in massage chair  NuStep x 5 min: therapist present for monitoring pain symptoms and to assist with transfers on/off equipment:  (.2 mile)   Patient response to treatment: improved soft tissue elasticity with decreased spasms and tenderness up to 50%; mild fatigue with Nustep, no pain reported during exercise      PT Education - 09/22/17 1755    Education provided  Yes    Education Details  Plan for progressive exercise/core strengthening    Person(s) Educated  Patient    Methods  Explanation    Comprehension  Verbalized understanding          PT Long Term Goals - 09/09/17 1430      PT LONG TERM GOAL #1   Title  Patient will improve MODI score to 40% or better demonstrating improved function with daily tasks and able to stand, sit and walk with less difficulty    Baseline  MODI 55%; 45% 09/09/17    Status  Partially Met      PT LONG TERM GOAL #2   Title  Patient will ben independent with home program for pain control and progressive exercises for flexibility and strength in lumbar spine/right LE  in order to transition to home program     Baseline  limited knowldge of appropriate exercises and progression without cuing, instruction    Status  Revised    Target Date  10/21/17      PT LONG TERM GOAL #3   Title  Patient will improve MODI score to 30% or better demonstrating improved function with daily tasks and able to stand, sit and walk with less difficulty    Baseline  MODI 55%    Status  Revised    Target Date  10/21/17            Plan - 09/22/17 1756    Clinical Impression Statement  Patient with improving soft tissue elasticity and decreasing pain in back and will be able to progress to strengthening and endurance exercises next sessoin.     Rehab Potential  Fair    Clinical Impairments Affecting Rehab Potential  (+) motivated (-) muliple co morbidities, chronic condition    PT  Frequency  2x / week    PT Duration  6 weeks    PT Treatment/Interventions  Electrical Stimulation;Moist Heat;Ultrasound;Patient/family education;Neuromuscular re-education;Therapeutic exercise;Manual techniques    PT Next Visit  Plan  pain control, ther. ex, manual therapy    PT Home Exercise Plan  stabilization exercises; observe good body mechanics for daily tasks       Patient will benefit from skilled therapeutic intervention in order to improve the following deficits and impairments:  Decreased strength, Impaired flexibility, Decreased activity tolerance, Impaired perceived functional ability, Pain, Decreased endurance, Difficulty walking  Visit Diagnosis: Other muscle spasm  Chronic bilateral low back pain with sciatica, sciatica laterality unspecified  Muscle weakness (generalized)     Problem List Patient Active Problem List   Diagnosis Date Noted  . Assistance needed with transportation 05/26/2017  . Financial difficulties 05/26/2017  . Needs assistance with community resources 05/26/2017  . Moderate recurrent major depression ([place]) 05/26/2017  . Controlled substance agreement broken 10/11/2016  . Breast cancer screening 06/18/2016  . Knee pain 04/03/2016  . Hyponatremia 03/01/2016  . High triglycerides 12/24/2015  . Pes planus of both feet 11/16/2015  . Plantar fasciitis of right foot 11/16/2015  . Heel spur 11/16/2015  . Diabetic neuropathy ([place]) 11/16/2015  . Right ankle pain 11/16/2015  . Medication monitoring encounter 11/16/2015  . Diabetes mellitus without complication ([place]) 41/28/2081  . Chronic pain of multiple joints 08/15/2015  . Abnormal mammogram of right breast 06/19/2015  . Cerumen impaction 03/16/2015  . Hypertension goal BP (blood pressure) < 140/90 12/14/2014  . Hyperlipidemia LDL goal <100 12/14/2014  . History of transfusion of packed RBC 12/14/2014  . COPD, mild ([place]) 12/14/2014  . Hx of smoking 12/14/2014  . Status post bariatric surgery  12/14/2014  . Morbid obesity ([place]) 12/14/2014  . Chronic radicular lumbar pain 12/14/2014  . History of GI bleed 11/12/2014  . GERD without esophagitis 11/12/2014  . History of small bowel obstruction 09/07/2014    Jomarie Longs PT 09/23/2017, 5:57 PM  [facility] [address] [place], [address] Phone: [phone]   Fax:  [phone]  Name: IRASEMA CHALK MRN: [id number] Date of Birth: 1946-05-01

## 2017-09-23 ENCOUNTER — Ambulatory Visit (INDEPENDENT_AMBULATORY_CARE_PROVIDER_SITE_OTHER): Payer: PPO | Admitting: Family Medicine

## 2017-09-23 ENCOUNTER — Encounter: Payer: Self-pay | Admitting: Family Medicine

## 2017-09-23 VITALS — BP 122/76 | HR 81 | Temp 98.8°F | Resp 14 | Ht 62.0 in | Wt 211.7 lb

## 2017-09-23 DIAGNOSIS — E119 Type 2 diabetes mellitus without complications: Secondary | ICD-10-CM | POA: Diagnosis not present

## 2017-09-23 DIAGNOSIS — M2142 Flat foot [pes planus] (acquired), left foot: Secondary | ICD-10-CM | POA: Diagnosis not present

## 2017-09-23 DIAGNOSIS — R32 Unspecified urinary incontinence: Secondary | ICD-10-CM | POA: Diagnosis not present

## 2017-09-23 DIAGNOSIS — K219 Gastro-esophageal reflux disease without esophagitis: Secondary | ICD-10-CM

## 2017-09-23 DIAGNOSIS — J449 Chronic obstructive pulmonary disease, unspecified: Secondary | ICD-10-CM | POA: Diagnosis not present

## 2017-09-23 DIAGNOSIS — M2141 Flat foot [pes planus] (acquired), right foot: Secondary | ICD-10-CM | POA: Diagnosis not present

## 2017-09-23 DIAGNOSIS — I10 Essential (primary) hypertension: Secondary | ICD-10-CM | POA: Diagnosis not present

## 2017-09-23 NOTE — Assessment & Plan Note (Addendum)
Last A1c showed good control; morbidly obese (DM + GERD + COPD with BMI >35); weight loss would help

## 2017-09-23 NOTE — Assessment & Plan Note (Signed)
Well controlled today.

## 2017-09-23 NOTE — Patient Instructions (Addendum)
Please do see your eye doctor regularly, and have your eyes examined every year (or more often per his or her recommendation) Check your feet every night and let me know right away of any sores, infections, numbness, etc. Try to limit sweets, white bread, white rice, white potatoes It is okay with me for you to not check your fingerstick blood sugars (per [employer] Best Practices), unless you are interested and feel it would be helpful for you Check out the information at familydoctor.org entitled "Nutrition for Weight Loss: What You Need to Know about Fad Diets" Try to lose between 1-2 pounds per week by taking in fewer calories and burning off more calories You can succeed by limiting portions, limiting foods dense in calories and fat, becoming more active, and drinking 8 glasses of water a day (64 ounces) Don't skip meals, especially breakfast, as skipping meals may alter your metabolism Do not use over-the-counter weight loss pills or gimmicks that claim rapid weight loss A healthy BMI (or body mass index) is between 18.5 and 24.9 You can calculate your ideal BMI at the NIH website JobE[employer]ics.huhttp://www.nhlbi.nih.gov/health/educational/lose_wt/BMI/bmicalc.htm Avoid all coffee, chocolate, tea, any caffeinated beverages because those are bladder irritants If you need something for aches or pains, try to use Tylenol (acetaminophen) instead of non-steroidals (which include Aleve, ibuprofen, Advil, Motrin, and naproxen); non-steroidals can cause long-term kidney damage We'll get a urine today We'll have you see the nutritionist If you have not heard anything from my staff in a week about any orders/referrals/studies from today, please contact us here to follow-up [phone]

## 2017-09-23 NOTE — Progress Notes (Signed)
BP 122/76   Pulse 81   Temp 98.8 F (37.1 C) (Oral)   Resp 14   Ht 5\' 2"  (1.575 m)   Wt 211 lb 11.2 oz (96 kg)   SpO2 93%   BMI 38.72 kg/m    Subjective:    Patient ID: Andrea Holden, female    DOB: 06/11/1946, 72 y.o.   MRN: [id number]  HPI: Andrea Holden is a 72 y.o. female  Chief Complaint  Patient presents with  . Follow-up  . Obesity    could not get belviq covered even with letter (but usually medicaid and medicare does not cover weight loss drugs)  . paperwork    handicap sticker    HPI Here for f/u; had a cold two weeks ago, but not flu; used inhaler for 2-3 days Type 2 diabetes; not checkings sugars with my blessing; needs vision checked; issues with feet include pain in the right heel; see foot specialist; sill has some swelling along right foot, but better than before; trying to avoid sugary drinks; avoids wheat bread, not much bread anyway Lab Results  Component Value Date   HGBA1C 6.5 (H) 06/12/2017   HTN; 126 systolic at home yesterday; checking BP at home  Obesity; she has lost 11 pounds; has Medicaid; last meeting with nutritionist was 3 years ago; she is thinking about joining; peak weight was 325 pounds; lost 178 pounds total Not very good with apps  Paperwork for hanidcapped sticker; cannot walk 200 feet without stopping, has to use cane  CKD stage 3; avoiding NSAIDs  Having incontinence problems and wearing a pad every night just in case; releasing urine early, starts to go  She is getting medical records together for some sort of lawsuit for a benign ovarian tumor; she says she was standing on concrete (?)  Depression screen [facility] 2/9 09/23/2017 06/09/2017 05/26/2017 11/15/2016 10/04/2016  Decreased Interest 0 0 2 0 0  Down, Depressed, Hopeless 1 1 3  0 1  PHQ - 2 Score 1 1 5  0 1  Altered sleeping - - 2 - -  Tired, decreased energy - - 1 - -  Change in appetite - - 1 - -  Feeling bad or failure about yourself  - - 1 - -  Trouble  concentrating - - 0 - -  Moving slowly or fidgety/restless - - 0 - -  Suicidal thoughts - - 2 - -  PHQ-9 Score - - 12 - -  Difficult doing work/chores - - Very difficult - -    Relevant past medical, surgical, family and social history reviewed Past Medical History:  Diagnosis Date  . Allergy    seasonal allergies  . Anemia   . Anxiety   . Arthritis   . Asthma    allergy induced asthma  . Cataract   . Clotting disorder ([facility])    history of blood clots  . Complication of anesthesia    arrhythmia following colonoscopy  . Degenerative disc disease, lumbar   . Degenerative disc disease, lumbar   . Depression   . Diabetes mellitus   . Diabetic neuropathy ([facility]) 11/16/2015  . Dysrhythmia   . GERD (gastroesophageal reflux disease)   . H/O eating disorder   . H/O transfusion    patient was given 5 units of blood while at [facility], blood type O+  . History of chicken pox   . History of hiatal hernia   . History of measles, mumps, or rubella   .  HOH (hard of hearing)   . Hyperlipidemia   . Hypertension   . Irregular heartbeat   . Moderate COPD (chronic obstructive pulmonary disease) ([facility]) 12/14/2014  . Neuropathy   . Opiate use 11/16/2015  . Orthopnea   . Pes planus of both feet 11/16/2015  . Plantar fasciitis of right foot 11/16/2015  . Wheezing    Past Surgical History:  Procedure Laterality Date  . ABDOMINAL HYSTERECTOMY    . CATARACT EXTRACTION W/PHACO Right 05/30/2015   Procedure: CATARACT EXTRACTION PHACO AND INTRAOCULAR LENS PLACEMENT (IOC);  Surgeon: Galen Manila, MD;  Location: [facility] ORS;  Service: Ophthalmology;  Laterality: Right;  [place] 00:57 AP% 20.9 CDE 11.99 fluid pack lot #[id number]  . CHOLECYSTECTOMY    . COLON SURGERY     blocked colon  . FOOT FUSION Right 2018  . GALLBLADDER SURGERY    . GASTRIC BYPASS  2010  . internal bleeding  2016  . MOUTH SURGERY    . OVARY SURGERY    . SKIN GRAFT Right 2018   RT foot  . TENOTOMY ACHILLES TENDON      Percuntaneous  . TONSILLECTOMY     Family History  Problem Relation Age of Onset  . Asthma Mother   . COPD Mother   . Arthritis Father   . Depression Father   . Heart disease Father   . Hypertension Father   . Stroke Father   . Arthritis Brother   . Depression Brother   . Diabetes Brother   . Heart disease Brother   . Hyperlipidemia Brother   . Hypertension Brother   . Stroke Brother   . Vision loss Brother   . Diabetes Maternal Grandmother   . Thyroid disease Daughter   . Colitis Daughter   . Breast cancer Neg Hx    Social History   Tobacco Use  . Smoking status: Former Smoker    Years: 55.00    Types: Cigarettes    Start date: 07/01/1960    Last attempt to quit: 08/26/2016    Years since quitting: 1.0  . Smokeless tobacco: Never Used  Substance Use Topics  . Alcohol use: Yes    Alcohol/week: 0.0 oz    Comment: OCCASIONALLY  . Drug use: Not Currently    Types: Marijuana    Interim medical history since last visit reviewed. Allergies and medications reviewed  Review of Systems Per HPI unless specifically indicated above     Objective:    BP 122/76   Pulse 81   Temp 98.8 F (37.1 C) (Oral)   Resp 14   Ht 5\' 2"  (1.575 m)   Wt 211 lb 11.2 oz (96 kg)   SpO2 93%   BMI 38.72 kg/m   Wt Readings from Last 3 Encounters:  09/23/17 211 lb 11.2 oz (96 kg)  06/12/17 216 lb (98 kg)  05/26/17 222 lb 4.8 oz (100.8 kg)    Physical Exam  Constitutional: She appears well-developed and well-nourished. No distress.  obese  HENT:  Head: Normocephalic and atraumatic.  Eyes: EOM are normal. No scleral icterus.  Neck: No thyromegaly present.  Cardiovascular: Normal rate, regular rhythm and normal heart sounds.  No murmur heard. Pulmonary/Chest: Effort normal and breath sounds normal. No respiratory distress. She has no wheezes.  Abdominal: Soft. Bowel sounds are normal. She exhibits no distension.  Musculoskeletal: Normal range of motion. She exhibits no edema.  Pes  planus B  Neurological: She is alert. She exhibits normal muscle tone.  Skin: Skin is  warm and dry. She is not diaphoretic. No pallor.  Psychiatric: She has a normal mood and affect. Her behavior is normal. Judgment and thought content normal.   Diabetic Foot Form - Detailed   Diabetic Foot Exam - detailed Diabetic Foot exam was performed with the following findings:  Yes 09/23/2017  7:41 PM  Visual Foot Exam completed.:  Yes  Pulse Foot Exam completed.:  Yes  Sensory Foot Exam Completed.:  Yes Semmes-Weinstein Monofilament Test         Assessment & Plan:   Problem List Items Addressed This Visit      Cardiovascular and Mediastinum   Hypertension goal BP (blood pressure) < 140/90    Well-controlled today        Respiratory   COPD, mild (HCC)    With obesity        Digestive   GERD without esophagitis    Likely aggravated by her obesity        Endocrine   Diabetes mellitus without complication ([facility])    Last A1c showed good control; morbidly obese (DM + GERD + COPD with BMI >35); weight loss would help      Relevant Orders   Ambulatory referral to diabetic education     Other   Morbid obesity ([facility])    BMI > 35 with diabetes; refer to nutritionist      Relevant Orders   Ambulatory referral to diabetic education   Pes planus of both feet    Ongoing problems; will start seeing podiatrist in April; limits ambulation; okay for handicapped sticker       Other Visit Diagnoses    Urinary incontinence, unspecified type    -  Primary   Relevant Orders   Urinalysis w microscopic + reflex cultur (Completed)       Follow up plan: Return in about 3 months (around 12/12/2017) for twenty minute follow-up with fasting labs; Medicare wellness visit soon.  An after-visit summary was printed and given to the patient at check-out.  Please see the patient instructions which may contain other information and recommendations beyond what is mentioned above in the assessment and  plan.  No orders of the defined types were placed in this encounter.   Orders Placed This Encounter  Procedures  . Urinalysis w microscopic + reflex cultur  . REFLEXIVE URINE CULTURE  . Ambulatory referral to diabetic education

## 2017-09-23 NOTE — Assessment & Plan Note (Addendum)
Ongoing problems; will start seeing podiatrist in April; limits ambulation; okay for handicapped sticker

## 2017-09-23 NOTE — Assessment & Plan Note (Signed)
BMI > 35 with diabetes; refer to nutritionist

## 2017-09-24 ENCOUNTER — Encounter: Payer: PPO | Admitting: Physical Therapy

## 2017-09-24 ENCOUNTER — Telehealth: Payer: Self-pay | Admitting: Family Medicine

## 2017-09-24 DIAGNOSIS — Z79899 Other long term (current) drug therapy: Secondary | ICD-10-CM | POA: Diagnosis not present

## 2017-09-24 DIAGNOSIS — M545 Low back pain: Secondary | ICD-10-CM | POA: Diagnosis not present

## 2017-09-24 LAB — NO CULTURE INDICATED

## 2017-09-24 LAB — URINALYSIS W MICROSCOPIC + REFLEX CULTURE
Bacteria, UA: NONE SEEN /HPF
Bilirubin Urine: NEGATIVE
Glucose, UA: NEGATIVE
Hgb urine dipstick: NEGATIVE
Hyaline Cast: NONE SEEN /LPF
Ketones, ur: NEGATIVE
Leukocyte Esterase: NEGATIVE
Nitrites, Initial: NEGATIVE
Protein, ur: NEGATIVE
RBC / HPF: NONE SEEN /HPF (ref 0–2)
Specific Gravity, Urine: 1.02 (ref 1.001–1.03)
pH: 6.5 (ref 5.0–8.0)

## 2017-09-24 NOTE — Telephone Encounter (Signed)
Left voice mail

## 2017-09-24 NOTE — Telephone Encounter (Signed)
Copied from CRM [phone]#[id number]. Topic: Quick Communication - See Telephone Encounter >> Sep 24, 2017  8:42 AM Rudi CocoLathan, [place]eci Kalbfleisch M, NT wrote: CRM for notification. See Telephone encounter for: 09/24/17.  Pt. Calling needing to speak with Dr. Sherie DonLada or nurse about the weight lost [facility](Saxenda) and also needing all meds. [facility].  [facility] [place] - [address] [place] [id number] Phone: [phone] Fax: [phone]

## 2017-09-24 NOTE — Telephone Encounter (Signed)
Pt wants to see if you can prescribe saxenda?

## 2017-09-25 ENCOUNTER — Ambulatory Visit: Payer: PPO | Admitting: Physical Therapy

## 2017-09-25 ENCOUNTER — Encounter: Payer: Self-pay | Admitting: Physical Therapy

## 2017-09-25 DIAGNOSIS — M62838 Other muscle spasm: Secondary | ICD-10-CM | POA: Diagnosis not present

## 2017-09-25 DIAGNOSIS — M5440 Lumbago with sciatica, unspecified side: Secondary | ICD-10-CM

## 2017-09-25 DIAGNOSIS — G8929 Other chronic pain: Secondary | ICD-10-CM

## 2017-09-25 DIAGNOSIS — M6281 Muscle weakness (generalized): Secondary | ICD-10-CM

## 2017-09-25 NOTE — Therapy (Signed)
[facility] [address] [place], [address] Phone: [phone]   Fax:  [phone]  Physical Therapy Treatment  Patient Details  Name: Andrea Holden MRN: [id number] Date of Birth: February 19, 1946 Referring Provider: Rhys Martini DO   Encounter Date: 09/25/2017  PT End of Session - 09/25/17 1610    Visit Number  18    Number of Visits  24    Date for PT Re-Evaluation  10/21/17    PT Start Time  1324    PT Stop Time  1604    PT Time Calculation (min)  49 min    Activity Tolerance  Patient tolerated treatment well    Behavior During Therapy  [facility] for tasks assessed/performed       Past Medical History:  Diagnosis Date  . Allergy    seasonal allergies  . Anemia   . Anxiety   . Arthritis   . Asthma    allergy induced asthma  . Cataract   . Clotting disorder ([place])    history of blood clots  . Complication of anesthesia    arrhythmia following colonoscopy  . Degenerative disc disease, lumbar   . Degenerative disc disease, lumbar   . Depression   . Diabetes mellitus   . Diabetic neuropathy ([place]) 11/16/2015  . Dysrhythmia   . GERD (gastroesophageal reflux disease)   . H/O eating disorder   . H/O transfusion    patient was given 5 units of blood while at [place], blood type O+  . History of chicken pox   . History of hiatal hernia   . History of measles, mumps, or rubella   . HOH (hard of hearing)   . Hyperlipidemia   . Hypertension   . Irregular heartbeat   . Moderate COPD (chronic obstructive pulmonary disease) ([place]) 12/14/2014  . Neuropathy   . Opiate use 11/16/2015  . Orthopnea   . Pes planus of both feet 11/16/2015  . Plantar fasciitis of right foot 11/16/2015  . Wheezing     Past Surgical History:  Procedure Laterality Date  . ABDOMINAL HYSTERECTOMY    . CATARACT EXTRACTION W/PHACO Right 05/30/2015   Procedure: CATARACT EXTRACTION PHACO AND INTRAOCULAR LENS PLACEMENT (IOC);   Surgeon: Birder Robson, MD;  Location: [facility] ORS;  Service: Ophthalmology;  Laterality: Right;  [place] 00:57 AP% 20.9 CDE 11.99 fluid pack lot #[id number]  . CHOLECYSTECTOMY    . COLON SURGERY     blocked colon  . FOOT FUSION Right 2018  . GALLBLADDER SURGERY    . GASTRIC BYPASS  2010  . internal bleeding  2016  . MOUTH SURGERY    . OVARY SURGERY    . SKIN GRAFT Right 2018   RT foot  . TENOTOMY ACHILLES TENDON     Percuntaneous  . TONSILLECTOMY      There were no vitals filed for this visit.  Subjective Assessment - 09/25/17 1520    Subjective  Patient reports lower back pain with gardening yesterday.     Pertinent History  chronic back pain and foot pain over many years since 1995 with previous physical therapy with good results. Patient reports falling 06/02/17 which caused increased back pain and she is now waing to have evaluation by spine specialist    Limitations  Sitting;Standing;Walking;House hold activities    How long can you sit comfortably?  <1 hour    How long can you stand comfortably?  <20 min    How  long can you walk comfortably?  only short distances    Patient Stated Goals  Patient wants to be abe to walk more normally with less pain in back to return to normal activities     Currently in Pain?  Yes    Pain Score  3     Pain Location  Back    Pain Orientation  Lower    Pain Descriptors / Indicators  Aching    Pain Type  Chronic pain    Pain Onset  More than a month ago    Pain Frequency  Intermittent        Objective:  Palpation: mild spasms bilateral paraspinal muscles lumbar spine Transfers on and off massage chair with close supervision and assistance of therapist to stabilize chair  Gait: independent; using Hurry cane for support and close supervision x 1 for safety   Treatment: Manual Therapy: 17 min. Goal: spasm, pain STM performed to back from thoracic to lumbar region with concentration on bilateral lower lumbar region superficial and deep  techniques seated in massage chair  Therapeutic exercise: patient performed with demonstration, verbal cues and assistance of therapist: goal: strenth, improved endurance, function   Sitting:  Knee flexion blaterally green resistive band 2 x 15 Knee extension, controlled motion with tapping balance stones placed in front of patient x 25 Hip adduction with ball and glute sets x 15 reps Hip abduction with green resistive band x 15 reps Step ups onto balance stones leading with each LE x 15 reps with close supervision, non CG and patient balancing using UE's support   NuStep x 8 min  levle #3: therapist present for monitoring pain symptoms and to assist with transfers on/off equipment:  (.33 mile)   Patient response to treatment: improved soft tissue elasticity with decreased tenderness noted by 30% following STM and patient improved technique with exercises with moderate cuing for correcte technique.      PT Education - 09/25/17 1528    Education provided  Yes    Education Details  exercise instruction    Person(s) Educated  Patient    Methods  Explanation;Demonstration;Verbal cues    Comprehension  Verbal cues required;Returned demonstration;Verbalized understanding          PT Long Term Goals - 09/09/17 1430      PT LONG TERM GOAL #1   Title  Patient will improve MODI score to 40% or better demonstrating improved function with daily tasks and able to stand, sit and walk with less difficulty    Baseline  MODI 55%; 45% 09/09/17    Status  Partially Met      PT LONG TERM GOAL #2   Title  Patient will ben independent with home program for pain control and progressive exercises for flexibility and strength in lumbar spine/right LE  in order to transition to home program     Baseline  limited knowldge of appropriate exercises and progression without cuing, instruction    Status  Revised    Target Date  10/21/17      PT LONG TERM GOAL #3   Title  Patient will improve MODI score to  30% or better demonstrating improved function with daily tasks and able to stand, sit and walk with less difficulty    Baseline  MODI 55%    Status  Revised    Target Date  10/21/17            Plan - 09/25/17 1604    Clinical Impression Statement  Patient is progressing towards goals and able to perform exercises with assistance, guidance of therapist with moderate cuing.     Rehab Potential  Fair    Clinical Impairments Affecting Rehab Potential  (+) motivated (-) muliple co morbidities, chronic condition    PT Frequency  2x / week    PT Duration  6 weeks    PT Treatment/Interventions  Electrical Stimulation;Moist Heat;Ultrasound;Patient/family education;Neuromuscular re-education;Therapeutic exercise;Manual techniques    PT Next Visit Plan  pain control, ther. ex, manual therapy    PT Home Exercise Plan  stabilization exercises; observe good body mechanics for daily tasks       Patient will benefit from skilled therapeutic intervention in order to improve the following deficits and impairments:  Decreased strength, Impaired flexibility, Decreased activity tolerance, Impaired perceived functional ability, Pain, Decreased endurance, Difficulty walking  Visit Diagnosis: Other muscle spasm  Chronic bilateral low back pain with sciatica, sciatica laterality unspecified  Muscle weakness (generalized)     Problem List Patient Active Problem List   Diagnosis Date Noted  . Assistance needed with transportation 05/26/2017  . Financial difficulties 05/26/2017  . Needs assistance with community resources 05/26/2017  . Moderate recurrent major depression ([place]) 05/26/2017  . Controlled substance agreement broken 10/11/2016  . Breast cancer screening 06/18/2016  . Knee pain 04/03/2016  . Hyponatremia 03/01/2016  . High triglycerides 12/24/2015  . Pes planus of both feet 11/16/2015  . Plantar fasciitis of right foot 11/16/2015  . Heel spur 11/16/2015  . Diabetic neuropathy ([place])  11/16/2015  . Right ankle pain 11/16/2015  . Medication monitoring encounter 11/16/2015  . Diabetes mellitus without complication ([place]) 60/63/0160  . Chronic pain of multiple joints 08/15/2015  . Abnormal mammogram of right breast 06/19/2015  . Cerumen impaction 03/16/2015  . Hypertension goal BP (blood pressure) < 140/90 12/14/2014  . Hyperlipidemia LDL goal <100 12/14/2014  . History of transfusion of packed RBC 12/14/2014  . COPD, mild ([place]) 12/14/2014  . Hx of smoking 12/14/2014  . Status post bariatric surgery 12/14/2014  . Morbid obesity ([place]) 12/14/2014  . Chronic radicular lumbar pain 12/14/2014  . History of GI bleed 11/12/2014  . GERD without esophagitis 11/12/2014  . History of small bowel obstruction 09/07/2014    Jomarie Longs PT 09/26/2017, 11:34 AM  [facility] [address] [place], [address] Phone: [phone]   Fax:  [phone]  Name: Andrea Holden MRN: [id number] Date of Birth: 23-Oct-1945

## 2017-09-28 NOTE — Assessment & Plan Note (Signed)
Likely aggravated by her obesity

## 2017-09-28 NOTE — Assessment & Plan Note (Signed)
With obesity

## 2017-09-29 ENCOUNTER — Encounter: Payer: Self-pay | Admitting: Physical Therapy

## 2017-09-29 ENCOUNTER — Ambulatory Visit: Payer: Medicare HMO | Attending: Physical Medicine and Rehabilitation | Admitting: Physical Therapy

## 2017-09-29 DIAGNOSIS — M6281 Muscle weakness (generalized): Secondary | ICD-10-CM | POA: Insufficient documentation

## 2017-09-29 DIAGNOSIS — G8929 Other chronic pain: Secondary | ICD-10-CM | POA: Diagnosis not present

## 2017-09-29 DIAGNOSIS — M62838 Other muscle spasm: Secondary | ICD-10-CM | POA: Insufficient documentation

## 2017-09-29 DIAGNOSIS — M5440 Lumbago with sciatica, unspecified side: Secondary | ICD-10-CM | POA: Insufficient documentation

## 2017-09-29 NOTE — Therapy (Signed)
[facility] [address] [place], [address] Phone: [phone]   Fax:  [phone]  Physical Therapy Treatment  Patient Details  Name: Andrea Holden MRN: [id number] Date of Birth: 1946-05-01 Referring Provider: Rhys Martini DO   Encounter Date: 09/29/2017  PT End of Session - 09/29/17 1522    Visit Number  19    Number of Visits  24    Date for PT Re-Evaluation  10/21/17    PT Start Time  1638    PT Stop Time  1600    PT Time Calculation (min)  43 min    Activity Tolerance  Patient tolerated treatment well    Behavior During Therapy  [facility] for tasks assessed/performed       Past Medical History:  Diagnosis Date  . Allergy    seasonal allergies  . Anemia   . Anxiety   . Arthritis   . Asthma    allergy induced asthma  . Cataract   . Clotting disorder ([place])    history of blood clots  . Complication of anesthesia    arrhythmia following colonoscopy  . Degenerative disc disease, lumbar   . Degenerative disc disease, lumbar   . Depression   . Diabetes mellitus   . Diabetic neuropathy ([place]) 11/16/2015  . Dysrhythmia   . GERD (gastroesophageal reflux disease)   . H/O eating disorder   . H/O transfusion    patient was given 5 units of blood while at [place], blood type O+  . History of chicken pox   . History of hiatal hernia   . History of measles, mumps, or rubella   . HOH (hard of hearing)   . Hyperlipidemia   . Hypertension   . Irregular heartbeat   . Moderate COPD (chronic obstructive pulmonary disease) ([place]) 12/14/2014  . Neuropathy   . Opiate use 11/16/2015  . Orthopnea   . Pes planus of both feet 11/16/2015  . Plantar fasciitis of right foot 11/16/2015  . Wheezing     Past Surgical History:  Procedure Laterality Date  . ABDOMINAL HYSTERECTOMY    . CATARACT EXTRACTION W/PHACO Right 05/30/2015   Procedure: CATARACT EXTRACTION PHACO AND INTRAOCULAR LENS PLACEMENT (IOC);  Surgeon:  Birder Robson, MD;  Location: [facility] ORS;  Service: Ophthalmology;  Laterality: Right;  [place] 00:57 AP% 20.9 CDE 11.99 fluid pack lot #[id number]  . CHOLECYSTECTOMY    . COLON SURGERY     blocked colon  . FOOT FUSION Right 2018  . GALLBLADDER SURGERY    . GASTRIC BYPASS  2010  . internal bleeding  2016  . MOUTH SURGERY    . OVARY SURGERY    . SKIN GRAFT Right 2018   RT foot  . TENOTOMY ACHILLES TENDON     Percuntaneous  . TONSILLECTOMY      There were no vitals filed for this visit.  Subjective Assessment - 09/29/17 1522    Subjective  Patient reports lower back pain improving wit htherapy. She is working in her yard and gets a little sore, overall improving.     Pertinent History  chronic back pain and foot pain over many years since 1995 with previous physical therapy with good results. Patient reports falling 06/02/17 which caused increased back pain and she is now waing to have evaluation by spine specialist    Limitations  Sitting;Standing;Walking;House hold activities    How long can you sit comfortably?  <1 hour  How long can you stand comfortably?  <20 min    How long can you walk comfortably?  only short distances    Patient Stated Goals  Patient wants to be abe to walk more normally with less pain in back to return to normal activities     Currently in Pain?  Yes    Pain Score  3     Pain Location  Back    Pain Orientation  Lower    Pain Descriptors / Indicators  Aching;Sore    Pain Type  Chronic pain    Pain Onset  More than a month ago    Pain Frequency  Intermittent           Objective:  Palpation: mild spasms bilateral paraspinal muscles lumbar spine Transfers on and off massage chair with close supervision and assistance of therapist to stabilize chair    Treatment: Manual Therapy: 23 min. Goal: spasm, pain STM performed to back from cervical spine, thoracic to lumbar region with concentration on bilateral lower lumbar region superficial and deep  techniques seated in massage chair   Therapeutic exercise: patient performed with demonstration, verbal cues and assistance of therapist: goal: strenth, improved endurance, function   Sitting:  Knee flexion bilaterally green resistive band x 10 Knee extension, controlled motion with resistive band x 10 Hip adduction with ball and glute sets x 15 reps Hip abduction with green resistive band x 15 reps  Patient was walked to car with supervision for safety    Patient response to treatment: fatigue limits exercises performed today       PT Education - 09/29/17 1521    Education provided  Yes    Education Details  exercise instruction    Person(s) Educated  Patient    Methods  Explanation;Demonstration;Verbal cues    Comprehension  Verbalized understanding;Returned demonstration;Verbal cues required          PT Long Term Goals - 09/09/17 1430      PT LONG TERM GOAL #1   Title  Patient will improve MODI score to 40% or better demonstrating improved function with daily tasks and able to stand, sit and walk with less difficulty    Baseline  MODI 55%; 45% 09/09/17    Status  Partially Met      PT LONG TERM GOAL #2   Title  Patient will ben independent with home program for pain control and progressive exercises for flexibility and strength in lumbar spine/right LE  in order to transition to home program     Baseline  limited knowldge of appropriate exercises and progression without cuing, instruction    Status  Revised    Target Date  10/21/17      PT LONG TERM GOAL #3   Title  Patient will improve MODI score to 30% or better demonstrating improved function with daily tasks and able to stand, sit and walk with less difficulty    Baseline  MODI 55%    Status  Revised    Target Date  10/21/17            Plan - 09/29/17 1603    Clinical Impression Statement  Patient is progessing with less back pain and is able to perform exercises with minimal cuing and assistance for  technique. She is limited today due to fatigue.     Rehab Potential  Fair    Clinical Impairments Affecting Rehab Potential  (+) motivated (-) muliple co morbidities, chronic condition    PT  Frequency  2x / week    PT Duration  6 weeks    PT Treatment/Interventions  Electrical Stimulation;Moist Heat;Ultrasound;Patient/family education;Neuromuscular re-education;Therapeutic exercise;Manual techniques    PT Next Visit Plan  pain control, ther. ex, manual therapy    PT Home Exercise Plan  stabilization exercises; observe good body mechanics for daily tasks       Patient will benefit from skilled therapeutic intervention in order to improve the following deficits and impairments:  Decreased strength, Impaired flexibility, Decreased activity tolerance, Impaired perceived functional ability, Pain, Decreased endurance, Difficulty walking  Visit Diagnosis: Other muscle spasm  Chronic bilateral low back pain with sciatica, sciatica laterality unspecified  Muscle weakness (generalized)     Problem List Patient Active Problem List   Diagnosis Date Noted  . Assistance needed with transportation 05/26/2017  . Financial difficulties 05/26/2017  . Needs assistance with community resources 05/26/2017  . Moderate recurrent major depression ([place]) 05/26/2017  . Controlled substance agreement broken 10/11/2016  . Breast cancer screening 06/18/2016  . Knee pain 04/03/2016  . Hyponatremia 03/01/2016  . High triglycerides 12/24/2015  . Pes planus of both feet 11/16/2015  . Plantar fasciitis of right foot 11/16/2015  . Heel spur 11/16/2015  . Diabetic neuropathy ([place]) 11/16/2015  . Right ankle pain 11/16/2015  . Medication monitoring encounter 11/16/2015  . Diabetes mellitus without complication ([place]) 16/04/9603  . Chronic pain of multiple joints 08/15/2015  . Abnormal mammogram of right breast 06/19/2015  . Cerumen impaction 03/16/2015  . Hypertension goal BP (blood pressure) < 140/90  12/14/2014  . Hyperlipidemia LDL goal <100 12/14/2014  . History of transfusion of packed RBC 12/14/2014  . COPD, mild ([place]) 12/14/2014  . Hx of smoking 12/14/2014  . Status post bariatric surgery 12/14/2014  . Morbid obesity ([place]) 12/14/2014  . Chronic radicular lumbar pain 12/14/2014  . History of GI bleed 11/12/2014  . GERD without esophagitis 11/12/2014  . History of small bowel obstruction 09/07/2014    Jomarie Longs PT 09/29/2017, 11:05 PM  [facility] [address] [place], [address] Phone: [phone]   Fax:  [phone]  Name: Andrea Holden MRN: [id number] Date of Birth: Nov 11, 1945

## 2017-09-29 NOTE — Telephone Encounter (Signed)
Right now, I want to start with nutritionist I'd like her to keep track of every single thing she eats, either by writing in on paper in a notebook or using an app or computer program like [employer]itness[employer] In addition to the actual food, she'll need to record the portion sizes I'd also like her to record everything that she drinks, ounces of water, and any other beverages and whether or not they have sugar or artificial sweetener Take that to nutritionist and then bring a copy of that by here (I'd like to see two weeks worth, please) Thank you

## 2017-09-30 NOTE — Telephone Encounter (Signed)
Pt notified and has agreed to try the [employer]itness[employer] app to track her food. Offered to help pt navigate the app if she wanted to come by the office Thursday because she stated she had a hard time using the app but also preferred it over witting it down on paper.

## 2017-10-02 ENCOUNTER — Ambulatory Visit: Payer: Medicare HMO | Admitting: Physical Therapy

## 2017-10-02 ENCOUNTER — Encounter: Payer: Self-pay | Admitting: Physical Therapy

## 2017-10-02 DIAGNOSIS — G8929 Other chronic pain: Secondary | ICD-10-CM

## 2017-10-02 DIAGNOSIS — M6281 Muscle weakness (generalized): Secondary | ICD-10-CM | POA: Diagnosis not present

## 2017-10-02 DIAGNOSIS — M62838 Other muscle spasm: Secondary | ICD-10-CM | POA: Diagnosis not present

## 2017-10-02 DIAGNOSIS — M5440 Lumbago with sciatica, unspecified side: Secondary | ICD-10-CM

## 2017-10-02 NOTE — Therapy (Signed)
[place] [facility] [address] [place], [address] Phone: [phone]   Fax:  [phone]  Physical Therapy Treatment  Patient Details  Name: Andrea Holden MRN: [id number] Date of Birth: Nov 09, 1945 Referring Provider: Rhys Martini DO   Encounter Date: 10/02/2017  PT End of Session - 10/02/17 1603    Visit Number  20    Number of Visits  24    Date for PT Re-Evaluation  10/21/17    PT Start Time  2355    PT Stop Time  1615    PT Time Calculation (min)  45 min    Activity Tolerance  Patient tolerated treatment well    Behavior During Therapy  [facility] for tasks assessed/performed       Past Medical History:  Diagnosis Date  . Allergy    seasonal allergies  . Anemia   . Anxiety   . Arthritis   . Asthma    allergy induced asthma  . Cataract   . Clotting disorder ([place])    history of blood clots  . Complication of anesthesia    arrhythmia following colonoscopy  . Degenerative disc disease, lumbar   . Degenerative disc disease, lumbar   . Depression   . Diabetes mellitus   . Diabetic neuropathy ([place]) 11/16/2015  . Dysrhythmia   . GERD (gastroesophageal reflux disease)   . H/O eating disorder   . H/O transfusion    patient was given 5 units of blood while at [place], blood type O+  . History of chicken pox   . History of hiatal hernia   . History of measles, mumps, or rubella   . HOH (hard of hearing)   . Hyperlipidemia   . Hypertension   . Irregular heartbeat   . Moderate COPD (chronic obstructive pulmonary disease) ([place]) 12/14/2014  . Neuropathy   . Opiate use 11/16/2015  . Orthopnea   . Pes planus of both feet 11/16/2015  . Plantar fasciitis of right foot 11/16/2015  . Wheezing     Past Surgical History:  Procedure Laterality Date  . ABDOMINAL HYSTERECTOMY    . CATARACT EXTRACTION W/PHACO Right 05/30/2015   Procedure: CATARACT EXTRACTION PHACO AND INTRAOCULAR LENS PLACEMENT (IOC);  Surgeon:  Birder Robson, MD;  Location: [facility] ORS;  Service: Ophthalmology;  Laterality: Right;  [place] 00:57 AP% 20.9 CDE 11.99 fluid pack lot #[id number]  . CHOLECYSTECTOMY    . COLON SURGERY     blocked colon  . FOOT FUSION Right 2018  . GALLBLADDER SURGERY    . GASTRIC BYPASS  2010  . internal bleeding  2016  . MOUTH SURGERY    . OVARY SURGERY    . SKIN GRAFT Right 2018   RT foot  . TENOTOMY ACHILLES TENDON     Percuntaneous  . TONSILLECTOMY      There were no vitals filed for this visit.  Subjective Assessment - 10/02/17 1532    Subjective  Patient reports she is unable to walk without cane due to increased back pain. She is arriving 15 minutes late due to traffic and being delayed at [facility].       Pertinent History  chronic back pain and foot pain over many years since 1995 with previous physical therapy with good results. Patient reports falling 06/02/17 which caused increased back pain and she is now waing to have evaluation by spine specialist    Limitations  Sitting;Standing;Walking;House hold activities    How long can  you sit comfortably?  <1 hour    How long can you stand comfortably?  <20 min    How long can you walk comfortably?  only short distances    Patient Stated Goals  Patient wants to be abe to walk more normally with less pain in back to return to normal activities     Currently in Pain?  Yes    Pain Score  4     Pain Location  Back    Pain Orientation  Lower    Pain Descriptors / Indicators  Aching;Sore    Pain Type  Chronic pain    Pain Onset  More than a month ago    Pain Frequency  Intermittent       Objective:  Palpation: mild spasms bilateral paraspinal muscles lumbar spine Transfers on and off massage chair with close supervision and assistance of therapist to stabilize chair    Treatment: Manual Therapy: 15 min. Goal: spasm, pain STM performed to back from cervical spine, thoracic to lumbar region with concentration on bilateral lower lumbar region  superficial and deep techniques seated in massage chair   Therapeutic exercise: patient performed with demonstration, verbal cues and assistance of therapist: goal: strenth, improved endurance, function   Sitting:  Knee flexion bilaterally green resistive band x 10 Knee extension, controlled motion with resistive band x 10 Hip adduction with ball and glute sets x 15 reps Hip abduction with green resistive band x 15 reps Bilateral rows with green resistive band x 15 Straight arm pull downs with green resistive band x 15 Single arm rows x 10 reps each   Nustep x 10 min. Independent following set up (seat #8, level #4 intensity) and assistance of therapist for getting on   Patient was walked to car with supervision for safety   Patient response to treatment: patient with improved soft tissue elasticity, decrased spasms by 30% and required moderate cuing for improved technique with exercises.    PT Education - 10/02/17 1532    Education provided  Yes    Education Details  exercise instruction    Person(s) Educated  Patient    Methods  Explanation;Demonstration;Verbal cues    Comprehension  Verbalized understanding;Returned demonstration;Verbal cues required          PT Long Term Goals - 09/09/17 1430      PT LONG TERM GOAL #1   Title  Patient will improve MODI score to 40% or better demonstrating improved function with daily tasks and able to stand, sit and walk with less difficulty    Baseline  MODI 55%; 45% 09/09/17    Status  Partially Met      PT LONG TERM GOAL #2   Title  Patient will ben independent with home program for pain control and progressive exercises for flexibility and strength in lumbar spine/right LE  in order to transition to home program     Baseline  limited knowldge of appropriate exercises and progression without cuing, instruction    Status  Revised    Target Date  10/21/17      PT LONG TERM GOAL #3   Title  Patient will improve MODI score to 30% or  better demonstrating improved function with daily tasks and able to stand, sit and walk with less difficulty    Baseline  MODI 55%    Status  Revised    Target Date  10/21/17            Plan - 10/02/17 1650  Clinical Impression Statement  Patient is progressing with exercises. She continues with pain in back intermittently that limits full function with daily activities. She requires cuing and guidance to perform appropriate exercises.     Rehab Potential  Fair    Clinical Impairments Affecting Rehab Potential  (+) motivated (-) muliple co morbidities, chronic condition    PT Frequency  2x / week    PT Duration  6 weeks    PT Treatment/Interventions  Electrical Stimulation;Moist Heat;Ultrasound;Patient/family education;Neuromuscular re-education;Therapeutic exercise;Manual techniques    PT Next Visit Plan  pain control, ther. ex, manual therapy    PT Home Exercise Plan  stabilization exercises; observe good body mechanics for daily tasks       Patient will benefit from skilled therapeutic intervention in order to improve the following deficits and impairments:  Decreased strength, Impaired flexibility, Decreased activity tolerance, Impaired perceived functional ability, Pain, Decreased endurance, Difficulty walking  Visit Diagnosis: Other muscle spasm  Chronic bilateral low back pain with sciatica, sciatica laterality unspecified  Muscle weakness (generalized)     Problem List Patient Active Problem List   Diagnosis Date Noted  . Assistance needed with transportation 05/26/2017  . Financial difficulties 05/26/2017  . Needs assistance with community resources 05/26/2017  . Moderate recurrent major depression ([place]) 05/26/2017  . Controlled substance agreement broken 10/11/2016  . Breast cancer screening 06/18/2016  . Knee pain 04/03/2016  . Hyponatremia 03/01/2016  . High triglycerides 12/24/2015  . Pes planus of both feet 11/16/2015  . Plantar fasciitis of right foot  11/16/2015  . Heel spur 11/16/2015  . Diabetic neuropathy ([place]) 11/16/2015  . Right ankle pain 11/16/2015  . Medication monitoring encounter 11/16/2015  . Diabetes mellitus without complication ([place]) 84/46/5207  . Chronic pain of multiple joints 08/15/2015  . Abnormal mammogram of right breast 06/19/2015  . Cerumen impaction 03/16/2015  . Hypertension goal BP (blood pressure) < 140/90 12/14/2014  . Hyperlipidemia LDL goal <100 12/14/2014  . History of transfusion of packed RBC 12/14/2014  . COPD, mild ([place]) 12/14/2014  . Hx of smoking 12/14/2014  . Status post bariatric surgery 12/14/2014  . Morbid obesity ([place]) 12/14/2014  . Chronic radicular lumbar pain 12/14/2014  . History of GI bleed 11/12/2014  . GERD without esophagitis 11/12/2014  . History of small bowel obstruction 09/07/2014    Jomarie Longs PT 10/03/2017, 11:55 AM  [place] [facility] [address], [place], [address] Phone: [phone]   Fax:  [phone]  Name: Andrea Holden MRN: [id number] Date of Birth: 1945/09/28

## 2017-10-06 ENCOUNTER — Ambulatory Visit: Payer: Medicare HMO | Admitting: Physical Therapy

## 2017-10-06 ENCOUNTER — Encounter: Payer: Self-pay | Admitting: Physical Therapy

## 2017-10-06 DIAGNOSIS — M5440 Lumbago with sciatica, unspecified side: Secondary | ICD-10-CM

## 2017-10-06 DIAGNOSIS — M6281 Muscle weakness (generalized): Secondary | ICD-10-CM

## 2017-10-06 DIAGNOSIS — G8929 Other chronic pain: Secondary | ICD-10-CM | POA: Diagnosis not present

## 2017-10-06 DIAGNOSIS — M62838 Other muscle spasm: Secondary | ICD-10-CM | POA: Diagnosis not present

## 2017-10-07 NOTE — Therapy (Signed)
[place] [facility] [address] [place], [address] Phone: [phone]   Fax:  [phone]  Physical Therapy Treatment  Patient Details  Name: Andrea Holden MRN: [id number] Date of Birth: September 07, 1945 Referring Provider: Rhys Martini DO   Encounter Date: 10/06/2017  PT End of Session - 10/06/17 1430    Visit Number  21    Number of Visits  24    Date for PT Re-Evaluation  10/21/17    PT Start Time  4401    PT Stop Time  1430    PT Time Calculation (min)  35 min    Activity Tolerance  Patient tolerated treatment well    Behavior During Therapy  [facility] for tasks assessed/performed       Past Medical History:  Diagnosis Date  . Allergy    seasonal allergies  . Anemia   . Anxiety   . Arthritis   . Asthma    allergy induced asthma  . Cataract   . Clotting disorder ([place])    history of blood clots  . Complication of anesthesia    arrhythmia following colonoscopy  . Degenerative disc disease, lumbar   . Degenerative disc disease, lumbar   . Depression   . Diabetes mellitus   . Diabetic neuropathy ([place]) 11/16/2015  . Dysrhythmia   . GERD (gastroesophageal reflux disease)   . H/O eating disorder   . H/O transfusion    patient was given 5 units of blood while at [place], blood type O+  . History of chicken pox   . History of hiatal hernia   . History of measles, mumps, or rubella   . HOH (hard of hearing)   . Hyperlipidemia   . Hypertension   . Irregular heartbeat   . Moderate COPD (chronic obstructive pulmonary disease) ([place]) 12/14/2014  . Neuropathy   . Opiate use 11/16/2015  . Orthopnea   . Pes planus of both feet 11/16/2015  . Plantar fasciitis of right foot 11/16/2015  . Wheezing     Past Surgical History:  Procedure Laterality Date  . ABDOMINAL HYSTERECTOMY    . CATARACT EXTRACTION W/PHACO Right 05/30/2015   Procedure: CATARACT EXTRACTION PHACO AND INTRAOCULAR LENS PLACEMENT (IOC);  Surgeon:  Birder Robson, MD;  Location: [facility] ORS;  Service: Ophthalmology;  Laterality: Right;  [place] 00:57 AP% 20.9 CDE 11.99 fluid pack lot #[id number]  . CHOLECYSTECTOMY    . COLON SURGERY     blocked colon  . FOOT FUSION Right 2018  . GALLBLADDER SURGERY    . GASTRIC BYPASS  2010  . internal bleeding  2016  . MOUTH SURGERY    . OVARY SURGERY    . SKIN GRAFT Right 2018   RT foot  . TENOTOMY ACHILLES TENDON     Percuntaneous  . TONSILLECTOMY      There were no vitals filed for this visit.  Subjective Assessment - 10/06/17 1432    Subjective  Patient reports back pain that is worse some days and she is motivated for exercise    Pertinent History  chronic back pain and foot pain over many years since 1995 with previous physical therapy with good results. Patient reports falling 06/02/17 which caused increased back pain and she is now waing to have evaluation by spine specialist    Limitations  Sitting;Standing;Walking;House hold activities    How long can you sit comfortably?  <1 hour    How long can you stand comfortably?  <  20 min    How long can you walk comfortably?  only short distances    Patient Stated Goals  Patient wants to be abe to walk more normally with less pain in back to return to normal activities     Currently in Pain?  Yes    Pain Score  4     Pain Location  Back    Pain Orientation  Lower    Pain Descriptors / Indicators  Aching;Sore    Pain Type  Chronic pain    Pain Onset  More than a month ago    Pain Frequency  Intermittent         Objective:  Palpation: mild spasms bilateral paraspinal muscles lumbar spine Transfers on and off massage chair with close supervision and assistance of therapist to stabilize chair    Treatment: Manual Therapy: 13 min. Goal: spasm, pain STM performed to back from cervical spine, thoracic to lumbar region with concentration on bilateral lower lumbar region superficial and deep techniques seated in massage chair   Therapeutic  exercise: patient performed with demonstration, verbal cues and assistance of therapist: goal: strenth, improved endurance, function   Sitting:  Hip adduction with ball and glute sets x 15 Hip abduction with blue resistive band with isometric holding end range x 15 Knee flexion bilaterally green resistive band 2 x 15 Bilateral rows with green resistive band x 15 Straight arm pull downs with green resistive band x 15 Single arm rows doubled resistive band x 10 reps each    Patient response to treatment: patient demonstrated improved technique with exercises with minimal VC for correct alignment. Patient with decreased pain from 4/10 to 2-3/10. Patient with decreased spasms by 30% following STM. Improved motor control with repetition and cuing.      PT Education - 10/06/17 1433    Education provided  Yes    Education Details  exercise instruction    Person(s) Educated  Patient    Methods  Explanation;Verbal cues    Comprehension  Verbalized understanding;Verbal cues required          PT Long Term Goals - 09/09/17 1430      PT LONG TERM GOAL #1   Title  Patient will improve MODI score to 40% or better demonstrating improved function with daily tasks and able to stand, sit and walk with less difficulty    Baseline  MODI 55%; 45% 09/09/17    Status  Partially Met      PT LONG TERM GOAL #2   Title  Patient will ben independent with home program for pain control and progressive exercises for flexibility and strength in lumbar spine/right LE  in order to transition to home program     Baseline  limited knowldge of appropriate exercises and progression without cuing, instruction    Status  Revised    Target Date  10/21/17      PT LONG TERM GOAL #3   Title  Patient will improve MODI score to 30% or better demonstrating improved function with daily tasks and able to stand, sit and walk with less difficulty    Baseline  MODI 55%    Status  Revised    Target Date  10/21/17             Plan - 10/06/17 1435    Clinical Impression Statement  Patient continues to improve and progress steadily towards goals.  She is able to perform yard work and improving function with daily activities at  home and in community. She continues with decreased strength and lower back pain that will benefit from continued physical therapy  Intervention.    Rehab Potential  Fair    Clinical Impairments Affecting Rehab Potential  (+) motivated (-) muliple co morbidities, chronic condition    PT Frequency  2x / week    PT Duration  6 weeks    PT Treatment/Interventions  Electrical Stimulation;Moist Heat;Ultrasound;Patient/family education;Neuromuscular re-education;Therapeutic exercise;Manual techniques    PT Next Visit Plan  pain control, ther. ex, manual therapy    PT Home Exercise Plan  stabilization exercises; observe good body mechanics for daily tasks       Patient will benefit from skilled therapeutic intervention in order to improve the following deficits and impairments:  Decreased strength, Impaired flexibility, Decreased activity tolerance, Impaired perceived functional ability, Pain, Decreased endurance, Difficulty walking  Visit Diagnosis: Other muscle spasm  Chronic bilateral low back pain with sciatica, sciatica laterality unspecified  Muscle weakness (generalized)     Problem List Patient Active Problem List   Diagnosis Date Noted  . Assistance needed with transportation 05/26/2017  . Financial difficulties 05/26/2017  . Needs assistance with community resources 05/26/2017  . Moderate recurrent major depression ([place]) 05/26/2017  . Controlled substance agreement broken 10/11/2016  . Breast cancer screening 06/18/2016  . Knee pain 04/03/2016  . Hyponatremia 03/01/2016  . High triglycerides 12/24/2015  . Pes planus of both feet 11/16/2015  . Plantar fasciitis of right foot 11/16/2015  . Heel spur 11/16/2015  . Diabetic neuropathy ([place]) 11/16/2015  . Right  ankle pain 11/16/2015  . Medication monitoring encounter 11/16/2015  . Diabetes mellitus without complication ([place]) 38/88/7579  . Chronic pain of multiple joints 08/15/2015  . Abnormal mammogram of right breast 06/19/2015  . Cerumen impaction 03/16/2015  . Hypertension goal BP (blood pressure) < 140/90 12/14/2014  . Hyperlipidemia LDL goal <100 12/14/2014  . History of transfusion of packed RBC 12/14/2014  . COPD, mild ([place]) 12/14/2014  . Hx of smoking 12/14/2014  . Status post bariatric surgery 12/14/2014  . Morbid obesity ([place]) 12/14/2014  . Chronic radicular lumbar pain 12/14/2014  . History of GI bleed 11/12/2014  . GERD without esophagitis 11/12/2014  . History of small bowel obstruction 09/07/2014    Jomarie Longs PT 10/07/2017, 5:55 PM  [place] [facility] [address], [place], [address] Phone: [phone]   Fax:  [phone]  Name: Andrea Holden MRN: [id number] Date of Birth: 12/02/1945

## 2017-10-09 ENCOUNTER — Ambulatory Visit: Payer: Medicare HMO | Admitting: Physical Therapy

## 2017-10-13 ENCOUNTER — Encounter: Payer: Self-pay | Admitting: Physical Therapy

## 2017-10-13 ENCOUNTER — Ambulatory Visit: Payer: Medicare HMO | Admitting: Physical Therapy

## 2017-10-13 DIAGNOSIS — M62838 Other muscle spasm: Secondary | ICD-10-CM

## 2017-10-13 DIAGNOSIS — M5440 Lumbago with sciatica, unspecified side: Secondary | ICD-10-CM | POA: Diagnosis not present

## 2017-10-13 DIAGNOSIS — M6281 Muscle weakness (generalized): Secondary | ICD-10-CM

## 2017-10-13 DIAGNOSIS — G8929 Other chronic pain: Secondary | ICD-10-CM

## 2017-10-13 NOTE — Therapy (Signed)
[facility] [address] [place], [address] Phone: [phone]   Fax:  [phone]  Physical Therapy Treatment  Patient Details  Name: Andrea Holden MRN: [id number] Date of Birth: [birth year] Referring Provider: Rhys Martini DO   Encounter Date: 10/13/2017  PT End of Session - 10/13/17 1528    Visit Number  22    Number of Visits  24    Date for PT Re-Evaluation  10/21/17    PT Start Time  1519    PT Stop Time  1610    PT Time Calculation (min)  51 min    Activity Tolerance  Patient tolerated treatment well    Behavior During Therapy  [facility] for tasks assessed/performed       Past Medical History:  Diagnosis Date  . Allergy    seasonal allergies  . Anemia   . Anxiety   . Arthritis   . Asthma    allergy induced asthma  . Cataract   . Clotting disorder ([place])    history of blood clots  . Complication of anesthesia    arrhythmia following colonoscopy  . Degenerative disc disease, lumbar   . Degenerative disc disease, lumbar   . Depression   . Diabetes mellitus   . Diabetic neuropathy ([place]) 11/16/2015  . Dysrhythmia   . GERD (gastroesophageal reflux disease)   . H/O eating disorder   . H/O transfusion    patient was given 5 units of blood while at [place], blood type O+  . History of chicken pox   . History of hiatal hernia   . History of measles, mumps, or rubella   . HOH (hard of hearing)   . Hyperlipidemia   . Hypertension   . Irregular heartbeat   . Moderate COPD (chronic obstructive pulmonary disease) ([place]) 12/14/2014  . Neuropathy   . Opiate use 11/16/2015  . Orthopnea   . Pes planus of both feet 11/16/2015  . Plantar fasciitis of right foot 11/16/2015  . Wheezing     Past Surgical History:  Procedure Laterality Date  . ABDOMINAL HYSTERECTOMY    . CATARACT EXTRACTION W/PHACO Right 05/30/2015   Procedure: CATARACT EXTRACTION PHACO AND INTRAOCULAR LENS PLACEMENT (IOC);   Surgeon: Birder Robson, MD;  Location: [facility] ORS;  Service: Ophthalmology;  Laterality: Right;  [place] 00:57 AP% 20.9 CDE 11.99 fluid pack lot #[id number]  . CHOLECYSTECTOMY    . COLON SURGERY     blocked colon  . FOOT FUSION Right 2018  . GALLBLADDER SURGERY    . GASTRIC BYPASS  2010  . internal bleeding  2016  . MOUTH SURGERY    . OVARY SURGERY    . SKIN GRAFT Right 2018   RT foot  . TENOTOMY ACHILLES TENDON     Percuntaneous  . TONSILLECTOMY      There were no vitals filed for this visit.  Subjective Assessment - 10/13/17 1524    Subjective  Patient reports she is noticing feet do not move well and she is crossing her feet.     Pertinent History  chronic back pain and foot pain over many years since 1995 with previous physical therapy with good results. Patient reports falling 06/02/17 which caused increased back pain and she is now waing to have evaluation by spine specialist    Limitations  Sitting;Standing;Walking;House hold activities    How long can you sit comfortably?  <1 hour    How long can you stand  comfortably?  <20 min    How long can you walk comfortably?  only short distances    Patient Stated Goals  Patient wants to be abe to walk more normally with less pain in back to return to normal activities     Currently in Pain?  Yes    Pain Score  4     Pain Location  Back    Pain Orientation  Lower;Upper    Pain Descriptors / Indicators  Aching;Sore    Pain Onset  More than a month ago    Pain Frequency  Intermittent       Objective:  Palpation: mild spasms bilateral paraspinal muscles lumbar spine Transfers on and off massage chair with close supervision and assistance of therapist to stabilize chair    Treatment: Manual Therapy: 13 min. Goal: spasm, pain STM performed to back from cervical spine, thoracic to lumbar region with concentration on bilateral lower lumbar region superficial and deep techniques seated in massage chair   Therapeutic exercise: patient  performed with demonstration, verbal cues and assistance of therapist: goal: strenth, improved endurance, function   Sitting:  Hip adduction with ball and glute sets x 15 Hip abduction with blue resistive band with isometric holding end range x 15 Knee flexion bilaterally blue resistive band 2 x 15 Bilateral rows with green resistive band x 15 Straight arm pull downs with green resistive band x 15 Single arm rows doubled resistive band x 10 reps each   NuStep at end of session: independent L4 x 10 minutes: patient escorted to car at end of session for safety   Patient response to treatment: improved technique with VC and demonstration. Pain level improved from  4/10 to  3/10 with improved soft tissue elasticity by up to 50% with STM     PT Education - 10/13/17 1526    Education provided  Yes    Education Details  exercise instruction    Person(s) Educated  Patient    Methods  Explanation;Verbal cues;Demonstration    Comprehension  Verbalized understanding;Returned demonstration;Verbal cues required          PT Long Term Goals - 09/09/17 1430      PT LONG TERM GOAL #1   Title  Patient will improve MODI score to 40% or better demonstrating improved function with daily tasks and able to stand, sit and walk with less difficulty    Baseline  MODI 55%; 45% 09/09/17    Status  Partially Met      PT LONG TERM GOAL #2   Title  Patient will ben independent with home program for pain control and progressive exercises for flexibility and strength in lumbar spine/right LE  in order to transition to home program     Baseline  limited knowldge of appropriate exercises and progression without cuing, instruction    Status  Revised    Target Date  10/21/17      PT LONG TERM GOAL #3   Title  Patient will improve MODI score to 30% or better demonstrating improved function with daily tasks and able to stand, sit and walk with less difficulty    Baseline  MODI 55%    Status  Revised    Target  Date  10/21/17            Plan - 10/13/17 2233    Clinical Impression Statement  Patient demonstrates steady progress towards goals and requires guidance and VC to perform exercises with correct technique. She continues with  back spasms, pain that limit daily function and she will benefit from continued physical therapy intervention to achieve goals.    Rehab Potential  Fair    Clinical Impairments Affecting Rehab Potential  (+) motivated (-) muliple co morbidities, chronic condition    PT Frequency  2x / week    PT Duration  6 weeks    PT Treatment/Interventions  Electrical Stimulation;Moist Heat;Ultrasound;Patient/family education;Neuromuscular re-education;Therapeutic exercise;Manual techniques    PT Next Visit Plan  pain control, ther. ex, manual therapy    PT Home Exercise Plan  stabilization exercises; observe good body mechanics for daily tasks       Patient will benefit from skilled therapeutic intervention in order to improve the following deficits and impairments:  Decreased strength, Impaired flexibility, Decreased activity tolerance, Impaired perceived functional ability, Pain, Decreased endurance, Difficulty walking  Visit Diagnosis: Other muscle spasm  Chronic bilateral low back pain with sciatica, sciatica laterality unspecified  Muscle weakness (generalized)     Problem List Patient Active Problem List   Diagnosis Date Noted  . Assistance needed with transportation 05/26/2017  . Financial difficulties 05/26/2017  . Needs assistance with community resources 05/26/2017  . Moderate recurrent major depression ([place]) 05/26/2017  . Controlled substance agreement broken 10/11/2016  . Breast cancer screening 06/18/2016  . Knee pain 04/03/2016  . Hyponatremia 03/01/2016  . High triglycerides 12/24/2015  . Pes planus of both feet 11/16/2015  . Plantar fasciitis of right foot 11/16/2015  . Heel spur 11/16/2015  . Diabetic neuropathy ([place]) 11/16/2015  . Right ankle  pain 11/16/2015  . Medication monitoring encounter 11/16/2015  . Diabetes mellitus without complication ([place]) 11/88/6773  . Chronic pain of multiple joints 08/15/2015  . Abnormal mammogram of right breast 06/19/2015  . Cerumen impaction 03/16/2015  . Hypertension goal BP (blood pressure) < 140/90 12/14/2014  . Hyperlipidemia LDL goal <100 12/14/2014  . History of transfusion of packed RBC 12/14/2014  . COPD, mild ([place]) 12/14/2014  . Hx of smoking 12/14/2014  . Status post bariatric surgery 12/14/2014  . Morbid obesity ([place]) 12/14/2014  . Chronic radicular lumbar pain 12/14/2014  . History of GI bleed 11/12/2014  . GERD without esophagitis 11/12/2014  . History of small bowel obstruction 09/07/2014    Jomarie Longs PT 10/13/2017, 10:36 PM  [facility] [address] [place], [address] Phone: [phone]   Fax:  [phone]  Name: Andrea Holden MRN: [id number] Date of Birth: [birth year]

## 2017-10-16 ENCOUNTER — Ambulatory Visit: Payer: Medicare HMO | Admitting: Physical Therapy

## 2017-10-16 ENCOUNTER — Encounter: Payer: Self-pay | Admitting: Physical Therapy

## 2017-10-16 DIAGNOSIS — M62838 Other muscle spasm: Secondary | ICD-10-CM

## 2017-10-16 DIAGNOSIS — M6281 Muscle weakness (generalized): Secondary | ICD-10-CM

## 2017-10-16 DIAGNOSIS — G8929 Other chronic pain: Secondary | ICD-10-CM | POA: Diagnosis not present

## 2017-10-16 DIAGNOSIS — M5440 Lumbago with sciatica, unspecified side: Secondary | ICD-10-CM

## 2017-10-16 NOTE — Therapy (Signed)
[facility] [address], [place], [address] Phone: [phone]   Fax:  [phone]  Physical Therapy Treatment  Patient Details  Name: Andrea Holden MRN: [id number] Date of Birth: 07/19/1945 Referring Provider: Rhys Martini DO   Encounter Date: 10/16/2017  PT End of Session - 10/16/17 1533    Visit Number  23    Number of Visits  24    Date for PT Re-Evaluation  10/21/17    PT Start Time  0768    PT Stop Time  1602    PT Time Calculation (min)  41 min    Activity Tolerance  Patient tolerated treatment well    Behavior During Therapy  [facility] for tasks assessed/performed       Past Medical History:  Diagnosis Date  . Allergy    seasonal allergies  . Anemia   . Anxiety   . Arthritis   . Asthma    allergy induced asthma  . Cataract   . Clotting disorder ([place])    history of blood clots  . Complication of anesthesia    arrhythmia following colonoscopy  . Degenerative disc disease, lumbar   . Degenerative disc disease, lumbar   . Depression   . Diabetes mellitus   . Diabetic neuropathy ([place]) 11/16/2015  . Dysrhythmia   . GERD (gastroesophageal reflux disease)   . H/O eating disorder   . H/O transfusion    patient was given 5 units of blood while at [place], blood type O+  . History of chicken pox   . History of hiatal hernia   . History of measles, mumps, or rubella   . HOH (hard of hearing)   . Hyperlipidemia   . Hypertension   . Irregular heartbeat   . Moderate COPD (chronic obstructive pulmonary disease) ([place]) 12/14/2014  . Neuropathy   . Opiate use 11/16/2015  . Orthopnea   . Pes planus of both feet 11/16/2015  . Plantar fasciitis of right foot 11/16/2015  . Wheezing     Past Surgical History:  Procedure Laterality Date  . ABDOMINAL HYSTERECTOMY    . CATARACT EXTRACTION W/PHACO Right 05/30/2015   Procedure: CATARACT EXTRACTION PHACO AND INTRAOCULAR LENS PLACEMENT (IOC);   Surgeon: Birder Robson, MD;  Location: [facility] ORS;  Service: Ophthalmology;  Laterality: Right;  [place] 00:57 AP% 20.9 CDE 11.99 fluid pack lot #[id number]  . CHOLECYSTECTOMY    . COLON SURGERY     blocked colon  . FOOT FUSION Right 2018  . GALLBLADDER SURGERY    . GASTRIC BYPASS  2010  . internal bleeding  2016  . MOUTH SURGERY    . OVARY SURGERY    . SKIN GRAFT Right 2018   RT foot  . TENOTOMY ACHILLES TENDON     Percuntaneous  . TONSILLECTOMY      There were no vitals filed for this visit.  Subjective Assessment - 10/16/17 1523    Subjective  Patient reports she still not moving as well as she would like. She did stumble up her steps last week.     Pertinent History  chronic back pain and foot pain over many years since 1995 with previous physical therapy with good results. Patient reports falling 06/02/17 which caused increased back pain and she is now waing to have evaluation by spine specialist    Limitations  Sitting;Standing;Walking;House hold activities    How long can you sit comfortably?  <1 hour    How  long can you stand comfortably?  <20 min    How long can you walk comfortably?  only short distances    Patient Stated Goals  Patient wants to be abe to walk more normally with less pain in back to return to normal activities     Currently in Pain?  Yes    Pain Score  4     Pain Location  Back    Pain Orientation  Upper;Lower;Mid    Pain Descriptors / Indicators  Aching;Sore    Pain Type  Chronic pain    Pain Onset  More than a month ago    Pain Frequency  Intermittent          Objective:  Palpation: mild spasms bilateral paraspinal muscles lumbar spine Transfers on and off massage chair with close supervision and assistance of therapist to stabilize chair    Treatment: Manual Therapy: 13 min. Goal: spasm, pain STM performed to back from cervical spine, thoracic to lumbar region with concentration on bilateral lower lumbar region superficial and deep techniques  seated in massage chair   Therapeutic exercise: patient performed with demonstration, verbal cues and assistance of therapist: goal: strenth, improved endurance, function   Sitting:  Hip adduction with ball and glute sets x 15 Hip abduction with doubled blue resistive band  x 15 Knee flexion bilaterally blue resistive band 2 x 15 Knee extension 2 x 15 with 3# cuff weight on ankles Bilateral rows with blue resistive band x 15 Straight arm pull downs with blue resistive band x 15 Single arm rows doubled blue resistive band x 15 reps each    Patient response to treatment: patient demonstrated improved technique with exercises with minimal VC for correct alignment. Patient with decreased pain from 4/10 to  2-3/10. Patient with decreased improved soft tissue elasticity 50% following STM. Improved motor control with repetition and cuing   PT Education - 10/16/17 1542    Education provided  Yes    Education Details  exercise instruction for proper technique    Person(s) Educated  Patient    Methods  Explanation;Demonstration;Verbal cues    Comprehension  Verbal cues required;Returned demonstration;Verbalized understanding          PT Long Term Goals - 09/09/17 1430      PT LONG TERM GOAL #1   Title  Patient will improve MODI score to 40% or better demonstrating improved function with daily tasks and able to stand, sit and walk with less difficulty    Baseline  MODI 55%; 45% 09/09/17    Status  Partially Met      PT LONG TERM GOAL #2   Title  Patient will ben independent with home program for pain control and progressive exercises for flexibility and strength in lumbar spine/right LE  in order to transition to home program     Baseline  limited knowldge of appropriate exercises and progression without cuing, instruction    Status  Revised    Target Date  10/21/17      PT LONG TERM GOAL #3   Title  Patient will improve MODI score to 30% or better demonstrating improved function with  daily tasks and able to stand, sit and walk with less difficulty    Baseline  MODI 55%    Status  Revised    Target Date  10/21/17            Plan - 10/16/17 1610    Clinical Impression Statement  Patient demonstrated improved endurance  and strength with exercises with minimal cuing today. she continues with back pain and spasms that limit daily tasks however this is improving as well with physical therapy intervention.     Rehab Potential  Fair    Clinical Impairments Affecting Rehab Potential  (+) motivated (-) muliple co morbidities, chronic condition    PT Frequency  2x / week    PT Duration  6 weeks    PT Treatment/Interventions  Electrical Stimulation;Moist Heat;Ultrasound;Patient/family education;Neuromuscular re-education;Therapeutic exercise;Manual techniques    PT Next Visit Plan  pain control, ther. ex, manual therapy    PT Home Exercise Plan  stabilization exercises; observe good body mechanics for daily tasks       Patient will benefit from skilled therapeutic intervention in order to improve the following deficits and impairments:  Decreased strength, Impaired flexibility, Decreased activity tolerance, Impaired perceived functional ability, Pain, Decreased endurance, Difficulty walking  Visit Diagnosis: Other muscle spasm  Chronic bilateral low back pain with sciatica, sciatica laterality unspecified  Muscle weakness (generalized)     Problem List Patient Active Problem List   Diagnosis Date Noted  . Assistance needed with transportation 05/26/2017  . Financial difficulties 05/26/2017  . Needs assistance with community resources 05/26/2017  . Moderate recurrent major depression ([place]) 05/26/2017  . Controlled substance agreement broken 10/11/2016  . Breast cancer screening 06/18/2016  . Knee pain 04/03/2016  . Hyponatremia 03/01/2016  . High triglycerides 12/24/2015  . Pes planus of both feet 11/16/2015  . Plantar fasciitis of right foot 11/16/2015  .  Heel spur 11/16/2015  . Diabetic neuropathy ([place]) 11/16/2015  . Right ankle pain 11/16/2015  . Medication monitoring encounter 11/16/2015  . Diabetes mellitus without complication ([place]) 07/35/4301  . Chronic pain of multiple joints 08/15/2015  . Abnormal mammogram of right breast 06/19/2015  . Cerumen impaction 03/16/2015  . Hypertension goal BP (blood pressure) < 140/90 12/14/2014  . Hyperlipidemia LDL goal <100 12/14/2014  . History of transfusion of packed RBC 12/14/2014  . COPD, mild ([place]) 12/14/2014  . Hx of smoking 12/14/2014  . Status post bariatric surgery 12/14/2014  . Morbid obesity ([place]) 12/14/2014  . Chronic radicular lumbar pain 12/14/2014  . History of GI bleed 11/12/2014  . GERD without esophagitis 11/12/2014  . History of small bowel obstruction 09/07/2014    Jomarie Longs PT 10/17/2017, 1:54 PM  [facility] [address] [place], [address] Phone: [phone]   Fax:  [phone]  Name: Andrea Holden MRN: [id number] Date of Birth: 04-07-1946

## 2017-10-17 ENCOUNTER — Ambulatory Visit (INDEPENDENT_AMBULATORY_CARE_PROVIDER_SITE_OTHER): Payer: Medicare HMO

## 2017-10-17 ENCOUNTER — Other Ambulatory Visit: Payer: Self-pay

## 2017-10-17 VITALS — BP 112/60 | HR 74 | Temp 97.0°F | Resp 14 | Ht 62.0 in | Wt 205.4 lb

## 2017-10-17 DIAGNOSIS — Z1211 Encounter for screening for malignant neoplasm of colon: Secondary | ICD-10-CM

## 2017-10-17 DIAGNOSIS — Z0000 Encounter for general adult medical examination without abnormal findings: Secondary | ICD-10-CM | POA: Diagnosis not present

## 2017-10-17 DIAGNOSIS — Z598 Other problems related to housing and economic circumstances: Secondary | ICD-10-CM

## 2017-10-17 DIAGNOSIS — Z1159 Encounter for screening for other viral diseases: Secondary | ICD-10-CM | POA: Diagnosis not present

## 2017-10-17 DIAGNOSIS — Z599 Problem related to housing and economic circumstances, unspecified: Secondary | ICD-10-CM

## 2017-10-17 MED ORDER — LISINOPRIL 5 MG PO TABS: 5.0000 mg | ORAL_TABLET | Freq: Every day | ORAL | 1 refills | Status: DC

## 2017-10-17 MED ORDER — GABAPENTIN 400 MG PO CAPS: 400.0000 mg | ORAL_CAPSULE | Freq: Three times a day (TID) | ORAL | 2 refills | Status: DC

## 2017-10-17 MED ORDER — RANITIDINE HCL 300 MG PO TABS: 300.0000 mg | ORAL_TABLET | Freq: Every day | ORAL | 1 refills | Status: DC

## 2017-10-17 NOTE — Patient Instructions (Signed)
Ms. Andrea Holden , Thank you for taking time to come for your Medicare Wellness Visit. I appreciate your ongoing commitment to your health goals. Please review the following plan we discussed and let me know if I can assist you in the future.   Screening recommendations/referrals: Colorectal Screening: Completed colonoscopy 11/23/10. Repeat every 5 years. You will receive a call from our office regarding your appointment. Mammogram: Completed 07/18/17. Repeat every year. Bone Density: Please provide a copy of your most recent bone density report Lung Cancer Screening: You do not qualify for this screening Hepatitis C Screening: Ordered today  Vision and Dental Exams: Recommended annual ophthalmology exams for early detection of glaucoma and other disorders of the eye Recommended annual dental exams for proper oral hygiene  Diabetic Exams: Recommended annual diabetic eye exams for early detection of retinopathy Recommended annual diabetic foot exams for early detection of peripheral neuropathy.  Diabetic Eye Exam: Completed 09/30/15. Please schedule an appointment with your ophthalmologist for completion. Diabetic Foot Exam: Completed 09/23/17  Vaccinations: Influenza vaccine: Up to date Pneumococcal vaccine: Completed series Tdap vaccine: Up to date Shingles vaccine: Completed 1st dose 09/08/17. 2nd dose due within 6 months    Advanced directives: Advance directive discussed with you today. I have provided a copy for you to complete at home and have notarized. Once this is complete please bring a copy in to our office so we can scan it into your chart.  Conditions/risks identified: Recommend to drink at least 6-8 8oz glasses of water per day.  Next appointment: Please schedule your Annual Wellness Visit with your Nurse Health Advisor in one year.  Preventive Care 6065 Years and Older, Female Preventive care refers to lifestyle choices and visits with your health care provider that can promote  health and wellness. What does preventive care include?  A yearly physical exam. This is also called an annual well check.  Dental exams once or twice a year.  Routine eye exams. Ask your health care provider how often you should have your eyes checked.  Personal lifestyle choices, including:  Daily care of your teeth and gums.  Regular physical activity.  Eating a healthy diet.  Avoiding tobacco and drug use.  Limiting alcohol use.  Practicing safe sex.  Taking low-dose aspirin every day.  Taking vitamin and mineral supplements as recommended by your health care provider. What happens during an annual well check? The services and screenings done by your health care provider during your annual well check will depend on your age, overall health, lifestyle risk factors, and family history of disease. Counseling  Your health care provider may ask you questions about your:  Alcohol use.  Tobacco use.  Drug use.  Emotional well-being.  Home and relationship well-being.  Sexual activity.  Eating habits.  History of falls.  Memory and ability to understand (cognition).  Work and work Astronomerenvironment.  Reproductive health. Screening  You may have the following tests or measurements:  Height, weight, and BMI.  Blood pressure.  Lipid and cholesterol levels. These may be checked every 5 years, or more frequently if you are over 71 years old.  Skin check.  Lung cancer screening. You may have this screening every year starting at age 71 if you have a 30-pack-year history of smoking and currently smoke or have quit within the past 15 years.  Fecal occult blood test (FOBT) of the stool. You may have this test every year starting at age 71.  Flexible sigmoidoscopy or colonoscopy. You may have  a sigmoidoscopy every 5 years or a colonoscopy every 10 years starting at age 49.  Hepatitis C blood test.  Hepatitis B blood test.  Sexually transmitted disease (STD)  testing.  Diabetes screening. This is done by checking your blood sugar (glucose) after you have not eaten for a while (fasting). You may have this done every 1-3 years.  Bone density scan. This is done to screen for osteoporosis. You may have this done starting at age 39.  Mammogram. This may be done every 1-2 years. Talk to your health care provider about how often you should have regular mammograms. Talk with your health care provider about your test results, treatment options, and if necessary, the need for more tests. Vaccines  Your health care provider may recommend certain vaccines, such as:  Influenza vaccine. This is recommended every year.  Tetanus, diphtheria, and acellular pertussis (Tdap, Td) vaccine. You may need a Td booster every 10 years.  Zoster vaccine. You may need this after age 63.  Pneumococcal 13-valent conjugate (PCV13) vaccine. One dose is recommended after age 81.  Pneumococcal polysaccharide (PPSV23) vaccine. One dose is recommended after age 53. Talk to your health care provider about which screenings and vaccines you need and how often you need them. This information is not intended to replace advice given to you by your health care provider. Make sure you discuss any questions you have with your health care provider. Document Released: 07/14/2015 Document Revised: 03/06/2016 Document Reviewed: 04/18/2015 Elsevier Interactive Patient Education  2017 [employer].  Fall Prevention in the Home Falls can cause injuries. They can happen to people of all ages. There are many things you can do to make your home safe and to help prevent falls. What can I do on the outside of my home?  Regularly fix the edges of walkways and driveways and fix any cracks.  Remove anything that might make you trip as you walk through a door, such as a raised step or threshold.  Trim any bushes or trees on the path to your home.  Use bright outdoor lighting.  Clear any walking  paths of anything that might make someone trip, such as rocks or tools.  Regularly check to see if handrails are loose or broken. Make sure that both sides of any steps have handrails.  Any raised decks and porches should have guardrails on the edges.  Have any leaves, snow, or ice cleared regularly.  Use sand or salt on walking paths during winter.  Clean up any spills in your garage right away. This includes oil or grease spills. What can I do in the bathroom?  Use night lights.  Install grab bars by the toilet and in the tub and shower. Do not use towel bars as grab bars.  Use non-skid mats or decals in the tub or shower.  If you need to sit down in the shower, use a plastic, non-slip stool.  Keep the floor dry. Clean up any water that spills on the floor as soon as it happens.  Remove soap buildup in the tub or shower regularly.  Attach bath mats securely with double-sided non-slip rug tape.  Do not have throw rugs and other things on the floor that can make you trip. What can I do in the bedroom?  Use night lights.  Make sure that you have a light by your bed that is easy to reach.  Do not use any sheets or blankets that are too big for your bed.  They should not hang down onto the floor.  Have a firm chair that has side arms. You can use this for support while you get dressed.  Do not have throw rugs and other things on the floor that can make you trip. What can I do in the kitchen?  Clean up any spills right away.  Avoid walking on wet floors.  Keep items that you use a lot in easy-to-reach places.  If you need to reach something above you, use a strong step stool that has a grab bar.  Keep electrical cords out of the way.  Do not use floor polish or wax that makes floors slippery. If you must use wax, use non-skid floor wax.  Do not have throw rugs and other things on the floor that can make you trip. What can I do with my stairs?  Do not leave any items  on the stairs.  Make sure that there are handrails on both sides of the stairs and use them. Fix handrails that are broken or loose. Make sure that handrails are as long as the stairways.  Check any carpeting to make sure that it is firmly attached to the stairs. Fix any carpet that is loose or worn.  Avoid having throw rugs at the top or bottom of the stairs. If you do have throw rugs, attach them to the floor with carpet tape.  Make sure that you have a light switch at the top of the stairs and the bottom of the stairs. If you do not have them, ask someone to add them for you. What else can I do to help prevent falls?  Wear shoes that:  Do not have high heels.  Have rubber bottoms.  Are comfortable and fit you well.  Are closed at the toe. Do not wear sandals.  If you use a stepladder:  Make sure that it is fully opened. Do not climb a closed stepladder.  Make sure that both sides of the stepladder are locked into place.  Ask someone to hold it for you, if possible.  Clearly mark and make sure that you can see:  Any grab bars or handrails.  First and last steps.  Where the edge of each step is.  Use tools that help you move around (mobility aids) if they are needed. These include:  Canes.  Walkers.  Scooters.  Crutches.  Turn on the lights when you go into a dark area. Replace any light bulbs as soon as they burn out.  Set up your furniture so you have a clear path. Avoid moving your furniture around.  If any of your floors are uneven, fix them.  If there are any pets around you, be aware of where they are.  Review your medicines with your doctor. Some medicines can make you feel dizzy. This can increase your chance of falling. Ask your doctor what other things that you can do to help prevent falls. This information is not intended to replace advice given to you by your health care provider. Make sure you discuss any questions you have with your health care  provider. Document Released: 04/13/2009 Document Revised: 11/23/2015 Document Reviewed: 07/22/2014 Elsevier Interactive Patient Education  2017 [employer].

## 2017-10-17 NOTE — Telephone Encounter (Signed)
Need sent to new pharmacy

## 2017-10-17 NOTE — Progress Notes (Signed)
Subjective:   Andrea Holden is a 72 y.o. female who presents for Medicare Annual (Subsequent) preventive examination.  Review of Systems:  N/A Cardiac Risk Factors include: diabetes mellitus;advanced age (>48men, >41 women);dyslipidemia;hypertension;obesity (BMI >30kg/m2);sedentary lifestyle     Objective:     Vitals: BP 112/60 (BP Location: Right Wrist, Patient Position: Sitting, Cuff Size: Normal)   Pulse 74   Temp (!) 97 F (36.1 C) (Oral)   Resp 14   Ht 5\' 2"  (1.575 m)   Wt 205 lb 6.4 oz (93.2 kg)   SpO2 96%   BMI 37.57 kg/m   Body mass index is 37.57 kg/m.  Advanced Directives 10/17/2017 07/21/2017 06/09/2017 04/05/2017 11/15/2016 10/04/2016 09/03/2016  Does Patient Have a Medical Advance Directive? Yes;No Yes Yes Yes Yes No;Yes Yes  Type of Social research officer, government Power of [employer] Power of Attorney - - Healthcare Power of [place];Living will  Does patient want to make changes to medical advance directive? Yes (MAU/Ambulatory/Procedural Areas - Information given) - No - Patient declined Yes (Inpatient - patient requests chaplain consult to change a medical advance directive) - - -  Copy of Healthcare Power of Attorney in Chart? No - copy requested - No - copy requested No - copy requested - - -  Would patient like information on creating a medical advance directive? - - - No - Patient declined - - -    Tobacco Social History   Tobacco Use  Smoking Status Former Smoker  . Packs/day: 1.50  . Years: 55.00  . Pack years: 82.50  . Types: Cigarettes  . Start date: 07/01/1960  . Last attempt to quit: 08/26/2016  . Years since quitting: 1.1  Smokeless Tobacco Never Used  Tobacco Comment   smoking cessation materials not required     Counseling given: No Comment: smoking cessation materials not required   Clinical Intake:  Pre-visit preparation completed: Yes  Pain : No/denies pain   BMI -  recorded: 38.72 Nutritional Status: BMI > 30  Obese Nutritional Risks: None Diabetes: Yes CBG done?: No Did pt. bring in CBG monitor from home?: No   Nutrition Risk Assessment: Has the patient had any N/V/D within the last 2 months?  No Does the patient have any non-healing wounds?  No Has the patient had any unintentional weight loss or weight gain?  No  Is the patient diabetic?  Yes If diabetic, was a CBG obtained today?  No Did the patient bring in their glucometer from home?  No Comments: Pt states she does not monitor CBG's daily because she does not feel she is diabetic. States she feels her diabetes is diet controlled and did not feel it was necessary. Denies any financial strains with the device or supplies. Education provided re: the importance of monitoring her CBG's daily. Encouraged pt to begin monitoring CBG's daily, to log her results and to bring them with her to her next OV with Dr. Sherie Don. Verbalized acceptance and understanding.  Diabetic Exams: Diabetic Eye Exam: Overdue for diabetic eye exam. Last performed on 09/30/15. Pt has been advised to schedule an appt with her ophthalmologist for completion. States she has an outstanding balance which has prevented her from scheduling any future appointments. Referral sent to [facility] for financial assistance with medical expenses. Once this financial strain has been resolved and she has completed her exam, pt is aware, that she will need to provide the office with a copy of her diabetic  eye exam.   Diabetic Foot Exam: Completed 09/23/17.  How often do you need to have someone help you when you read instructions, pamphlets, or other written materials from your doctor or pharmacy?: 1 - Never  Interpreter Needed?: No  Information entered by :: AEversole, LPN   Hospitalizations/ED visits and surgeries occurring within the previous 12 months:  Within the previous 12 months, pt underwent the following surgeries @ [facility]:   12/11/16, Regional  Lengthening Or Shortening Of Tendon, Leg Or Ankle; Single Tendon (Separate Procedure) Arthrodesis, Midtarsal Or Tarsometatarsal, Single Joint Arthrodesis; Subtalar Fluoroscopy (Separate Procedure), by Dr. [name] Dupre  02/05/17, Free Skin Flap With Microvascular Anastomosis; Ankle; Ankle W/Donor Site Right Thigh Or Left Arm, RIGHT, by Dr. Louanne Skye  02/06/17, Left RFFF for soft tissue coverage of an anterior wound over the dorsum of the RLE, treated by Dr. Genelle Gather.  In addition to the above mentioned surgeries, pt was admitted 02/05/17 through 03/24/17 @ [facility] for Non-healing wound of lower extremity, right, subsequent encounter, treated by Dr. Genelle Gather.  Past Medical History:  Diagnosis Date  . Allergy    seasonal allergies  . Anemia   . Anxiety   . Arthritis   . Asthma    allergy induced asthma  . Cataract   . Clotting disorder (HCC)    history of blood clots  . Complication of anesthesia    arrhythmia following colonoscopy  . Degenerative disc disease, lumbar   . Degenerative disc disease, lumbar   . Depression   . Diabetes mellitus   . Diabetic neuropathy (HCC) 11/16/2015  . Dysrhythmia   . GERD (gastroesophageal reflux disease)   . H/O eating disorder   . H/O transfusion    patient was given 5 units of blood while at [facility], blood type O+  . History of chicken pox   . History of hiatal hernia   . History of measles, mumps, or rubella   . HOH (hard of hearing)   . Hyperlipidemia   . Hypertension   . Irregular heartbeat   . Moderate COPD (chronic obstructive pulmonary disease) (HCC) 12/14/2014  . Neuropathy   . Opiate use 11/16/2015  . Orthopnea   . Pes planus of both feet 11/16/2015  . Plantar fasciitis of right foot 11/16/2015  . Wheezing    Past Surgical History:  Procedure Laterality Date  . ABDOMINAL HYSTERECTOMY    . CATARACT EXTRACTION W/PHACO Right 05/30/2015   Procedure: CATARACT EXTRACTION PHACO AND INTRAOCULAR LENS PLACEMENT (IOC);  Surgeon: Galen Manila, MD;   Location: [facility] ORS;  Service: Ophthalmology;  Laterality: Right;  [place] 00:57 AP% 20.9 CDE 11.99 fluid pack lot #[id number]  . CHOLECYSTECTOMY    . COLON SURGERY     blocked colon  . FOOT FUSION Right 2018  . GALLBLADDER SURGERY    . GASTRIC BYPASS  2010  . internal bleeding  2016  . MOUTH SURGERY    . OVARY SURGERY    . SKIN GRAFT Right 2018   RT foot  . TENOTOMY ACHILLES TENDON     Percuntaneous  . TONSILLECTOMY     Family History  Problem Relation Age of Onset  . Asthma Mother   . COPD Mother   . Arthritis Father   . Depression Father   . Heart disease Father   . Hypertension Father   . Stroke Father   . Arthritis Brother   . Depression Brother   . Diabetes Brother   . Heart disease Brother   . Hyperlipidemia Brother   .  Hypertension Brother   . Stroke Brother   . Vision loss Brother   . Diabetes Maternal Grandmother   . Thyroid disease Daughter   . Colitis Daughter   . Breast cancer Neg Hx    Social History   Socioeconomic History  . Marital status: Divorced    Spouse name: Not on file  . Number of children: 1  . Years of education: Not on file  . Highest education level: Associate degree: academic program  Occupational History  . Occupation: Lawyerubstitute Teacher    Comment: [employer]  Social Needs  . Financial resource strain: Very hard  . Food insecurity:    Worry: Never true    Inability: Never true  . Transportation needs:    Medical: No    Non-medical: No  Tobacco Use  . Smoking status: Former Smoker    Packs/day: 1.50    Years: 55.00    Pack years: 82.50    Types: Cigarettes    Start date: 07/01/1960    Last attempt to quit: 08/26/2016    Years since quitting: 1.1  . Smokeless tobacco: Never Used  . Tobacco comment: smoking cessation materials not required  Substance and Sexual Activity  . Alcohol use: Yes    Alcohol/week: 0.0 oz    Comment: 2 drinks per month  . Drug use: Not Currently    Types: Marijuana  .  Sexual activity: Not Currently  Lifestyle  . Physical activity:    Days per week: 0 days    Minutes per session: 0 min  . Stress: Only a little  Relationships  . Social connections:    Talks on phone: Patient refused    Gets together: Patient refused    Attends religious service: Patient refused    Active member of club or organization: Patient refused    Attends meetings of clubs or organizations: Patient refused    Relationship status: Divorced  Other Topics Concern  . Not on file  Social History Narrative  . Not on file    Outpatient Encounter Medications as of 10/17/2017  Medication Sig  . acetaminophen (TYLENOL) 325 MG tablet Take 325 mg by mouth every 6 (six) hours as needed.   Marland Kitchen. albuterol (PROVENTIL HFA;VENTOLIN HFA) 108 (90 Base) MCG/ACT inhaler Inhale 2 puffs into the lungs every 6 (six) hours as needed for wheezing or shortness of breath.  Marland Kitchen. aspirin EC 81 MG tablet Take 81 mg by mouth daily.  . calcium elemental as carbonate (BARIATRIC TUMS ULTRA) 400 MG chewable tablet Chew 2 tablets by mouth daily.  Marland Kitchen. co-enzyme Q-10 30 MG capsule Take 30 mg by mouth every other day.  . fluticasone (FLONASE) 50 MCG/ACT nasal spray Place 2 sprays into the nose daily as needed.  . gabapentin (NEURONTIN) 400 MG capsule Take 1 capsule (400 mg total) by mouth 3 (three) times daily.  Marland Kitchen. lisinopril (PRINIVIL,ZESTRIL) 5 MG tablet Take 1 tablet (5 mg total) by mouth daily. (Patient taking differently: Take 10 mg by mouth daily. )  . Omega-3 Fatty Acids (FISH OIL) 1000 MG CAPS Take 1,000 capsules by mouth every other day.  . ranitidine (ZANTAC) 300 MG tablet Take 1 tablet (300 mg total) by mouth at bedtime.  . sertraline (ZOLOFT) 100 MG tablet Take 1.5 tablets (150 mg total) by mouth daily.  . Turmeric 500 MG CAPS Take 500 capsules by mouth every other day.   No facility-administered encounter medications on file as of 10/17/2017.  Activities of Daily Living In your present state of health, do  you have any difficulty performing the following activities: 10/17/2017 09/23/2017  Hearing? N N  Comment 33% hearing loss in R ear -  Vision? N N  Comment reading glasses; L cataract -  Difficulty concentrating or making decisions? Y N  Comment short term memory loss -  Walking or climbing stairs? Y N  Dressing or bathing? N N  Doing errands, shopping? N -  Preparing Food and eating ? N -  Comment partial upper and lower -  Using the Toilet? N -  In the past six months, have you accidently leaked urine? Y -  Comment incontinent and wears depend -  Do you have problems with loss of bowel control? N -  Managing your Medications? N -  Managing your Finances? N -  Housekeeping or managing your Housekeeping? N -  Some recent data might be hidden    Patient Care Team: Lada, Janit Bern, MD as PCP - General (Family Medicine) Dimmig, Maisie Fus, MD as Consulting Physician (Orthopedic Surgery) Gordan Payment, DO as Consulting Physician (Physical Medicine and Rehabilitation) Graylin Shiver, MD as Consulting Physician (Plastic Surgery)    Assessment:   This is a routine wellness examination for Vantasia.  Exercise Activities and Dietary recommendations Current Exercise Habits: The patient does not participate in regular exercise at present, Exercise limited by: orthopedic condition(s)(numerous surgeries to R leg and ankle)  Goals    . DIET - INCREASE WATER INTAKE     Recommend to drink at least 6-8 8oz glasses of water per day.       Fall Risk Fall Risk  10/17/2017 09/23/2017 06/09/2017 05/26/2017 11/15/2016  Falls in the past year? Yes Yes Yes No No  Comment states she stepped in to kitchen from outside and fell. States it had been raining - - - -  Number falls in past yr: 1 1 1  - -  Injury with Fall? No No No - -  Risk for fall due to : History of fall(s);Impaired vision;Impaired balance/gait;Medication side effect - History of fall(s);Impaired mobility;Impaired balance/gait - -  Risk  for fall due to: Comment wears reading glasses, cataracts, Sertraline and Percocet; walks with cane - - - -  Follow up Falls evaluation completed;Education provided;Falls prevention discussed - Falls evaluation completed - -   Is the home free of loose throw rugs in walkways, pet beds, electrical cords, etc? Yes Adequate lighting to reduce risk of falls?  Yes In addition, does the patient have any of the following: Stairs in or around the home WITH handrails? Yes Grab bars in the bathroom? Yes  Shower chair or a place to sit while bathing? Yes Use of an elevated toilet seat or a handicapped toilet? No Use of a cane, walker or w/c? Yes, cane and walker  Timed Get Up and Go Performed: Yes. Pt ambulated 10 feet within 25 sec. Gait slow, steady and with the use of an assistive device. No intervention required at this time. Fall risk prevention has been discussed.  Pt declined my offer to send Community Resource Referral to Care Guide for an elevated toilet seat.  Depression Screen PHQ 2/9 Scores 10/17/2017 09/23/2017 06/09/2017 05/26/2017  PHQ - 2 Score 2 1 1 5   PHQ- 9 Score 12 - - 12     Cognitive Function     6CIT Screen 10/17/2017  What Year? 0 points  What month? 0 points  What time? 0 points  Count back  from 20 0 points  Months in reverse 0 points  Repeat phrase 0 points  Total Score 0    Immunization History  Administered Date(s) Administered  . Influenza, High Dose Seasonal PF 03/16/2015, 02/28/2016  . Influenza,inj,Quad PF,6+ Mos 04/09/2017  . Influenza,inj,quad, With Preservative 08/29/2016  . Influenza-Unspecified 04/09/2017  . Pneumococcal Conjugate-13 03/16/2015  . Pneumococcal-Unspecified 08/29/2016  . Tdap 08/15/2015  . Zoster Recombinat (Shingrix) 09/08/2017    Qualifies for Shingles Vaccine? Yes. 1st Shingrix vaccine given 09/08/17. Will be due for 2nd dose within 6 months.  Screening Tests Health Maintenance  Topic Date Due  . OPHTHALMOLOGY EXAM   09/29/2016  . DEXA SCAN  06/12/2018 (Originally 01/26/2012)  . Hepatitis C Screening  06/12/2018 (Originally [birth year])  . COLONOSCOPY  09/24/2018 (Originally 11/23/2015)  . HEMOGLOBIN A1C  12/11/2017  . INFLUENZA VACCINE  01/29/2018  . MAMMOGRAM  07/18/2018  . FOOT EXAM  09/24/2018  . TETANUS/TDAP  08/14/2025  . PNA vac Low Risk Adult  Addressed    Cancer Screenings: Lung: Low Dose CT Chest recommended if Age 100-80 years, 30 pack-year currently smoking OR have quit w/in 15years. Patient does not qualify. Breast:  Up to date on Mammogram? Yes. Completed 07/18/17. Repeat every year   Bone Density/DEXA: Declined. Pt states she had a bone density exam performed about 5 years ago. No report found. States she will provide copies of her exam for review. Colorectal: Completed colonoscopy 11/23/10. Repeat every 5 years. Referred to GI for screening colonoscopy. Pt is aware that she will receive a call from our office re: her appt. Message sent to referral coordinator for scheduling purposes.  Additional Screenings: Hepatitis C Screening: Ordered today    Plan:  I have personally reviewed and addressed the Medicare Annual Wellness questionnaire and have noted the following in the patient's chart:  A. Medical and social history B. Use of alcohol, tobacco or illicit drugs  C. Current medications and supplements D. Functional ability and status E.  Nutritional status F.  Physical activity G. Advance directives H. List of other physicians I.  Hospitalizations, surgeries, and ER visits in previous 12 months J.  Vitals K. Screenings such as hearing and vision if needed, cognitive and depression L. Referrals and appointments  In addition, I have reviewed and discussed with patient certain preventive protocols, quality metrics, and best practice recommendations. A written personalized care plan for preventive services as well as general preventive health recommendations were provided to  patient.  See attached scanned questionnaire for additional information.   Signed,  Deon Pilling, LPN Nurse Health Advisor

## 2017-10-19 LAB — HEPATITIS C ANTIBODY
Hepatitis C Ab: NONREACTIVE
SIGNAL TO CUT-OFF: 0.01 (ref <1.00)

## 2017-10-20 ENCOUNTER — Ambulatory Visit: Payer: Medicare HMO | Admitting: Physical Therapy

## 2017-10-22 ENCOUNTER — Encounter: Payer: Medicare HMO | Attending: Family Medicine | Admitting: Dietician

## 2017-10-22 ENCOUNTER — Encounter: Payer: Self-pay | Admitting: Dietician

## 2017-10-22 VITALS — Ht 62.0 in | Wt 213.0 lb

## 2017-10-22 DIAGNOSIS — Z6838 Body mass index (BMI) 38.0-38.9, adult: Secondary | ICD-10-CM

## 2017-10-22 DIAGNOSIS — Z713 Dietary counseling and surveillance: Secondary | ICD-10-CM | POA: Diagnosis not present

## 2017-10-22 DIAGNOSIS — E6609 Other obesity due to excess calories: Secondary | ICD-10-CM

## 2017-10-22 DIAGNOSIS — E119 Type 2 diabetes mellitus without complications: Secondary | ICD-10-CM

## 2017-10-22 DIAGNOSIS — M545 Low back pain: Secondary | ICD-10-CM | POA: Diagnosis not present

## 2017-10-22 NOTE — Patient Instructions (Addendum)
   Continue to build up physical activity to continue increasing metabolism.  Keep portions of starchy foods to handful-size, or 1/2 cup or less. Limit portions of chips (1/2 cup) and popcorn (3 cups), and nuts (1/4 cup).   Great job making healthy food choices overall, keep it up!

## 2017-10-22 NOTE — Progress Notes (Signed)
Medical Nutrition Therapy: Visit start time: 1330  end time: 1430  Assessment:  Diagnosis: obesity, diabetes Past medical history: HTN, HLD, arthritis, neuropathy  Psychosocial issues/ stress concerns: difficult relationship with daughter Preferred learning method:  . Auditory . Hands-on  Current weight: 213.0lbs  Height: 5'2" Medications, supplements: reconciled list in medical record  Progress and evaluation: Patient reports having gastric bypass surgery about 7-8 years ago, lost a significant amount of weight, but experienced some weight gain after having foot surgery last year and being immobile for 4 months. She has struggled with weight loss since then. Likes to [employer] [place], uses mostly fresh foods. She has recently worked to reduce food portions and sugar intake.   Physical activity: recumbent bike, resistance bands, ball 30-45 minutes, 3-4 times a week  Dietary Intake:  Usual eating pattern includes 3 meals and 1-2 snacks per day. Dining out frequency: 2 meals per week.  Breakfast: scrambled eggs; or breakfast sandwich on English muffin with bacon or sausage (tries to get fat out), 2% cheese; or pancake with sausage in middle and sugar free syrup; mini buns (not biscuits) with sausage. Soemtimes uses liquid egg whites. Drinks 1/2-caff coffee with sugar free creamer Snack: none or fruit Lunch: out maybe up to 1 time a week-- salad with protein, BLT, cheeseburger, breakfast foods; at home hot dog no bun; or sandwich Snack: none Supper: usually chicken (red meat once a week) and vegetables and starch (potato, dreamfields pasta) Snack: potato chips, popcorn, chocolate Beverages: unsweetened tea, diet soda, 1/2-caff coffee. Uses Stevia for sweetener.  Nutrition Care Education: Topics covered: weight control Basic nutrition: appropriate nutrient balance    Weight control: portion control, and appropriate food portions for weight loss; daily protein needs; limiting high fat and high sugar  foods; benefits of tracking food intake; meal options for kcal control; role of physical activity in promoting healthy metabolism  Nutritional Diagnosis:  [place]-3.3 Overweight/obesity and [place]-3.4 Unintentional weight gain As related to excess calories, inactivity due to surgical recovery.  As evidenced by patient consuming some high calorie snacks and oversized portions, with BMI of 38.96.  Intervention: Instruction as noted above.   Patient has been making some diet changes to reduce caloric intake, and has noticed some weight loss.   Set goals with direction from patient.    She would like to follow-up in 2 weeks.   Education Materials given:  Marland Kitchen. Sample meal pattern/ menus-- bariatric menus for 1200, 1500kcal . Food diary sheets . Goals/ instructions  Learner/ who was taught:  . Patient   Level of understanding: Marland Kitchen. Verbalizes/ demonstrates competency .  Demonstrated degree of understanding via:   Teach back Learning barriers: . None  Willingness to learn/ readiness for change: . Eager, change in progress  Monitoring and Evaluation:  Dietary intake, exercise, and body weight      follow up: 11/05/17

## 2017-10-23 ENCOUNTER — Ambulatory Visit: Payer: Medicare HMO | Admitting: Physical Therapy

## 2017-10-23 ENCOUNTER — Encounter: Payer: Self-pay | Admitting: Physical Therapy

## 2017-10-23 DIAGNOSIS — G8929 Other chronic pain: Secondary | ICD-10-CM

## 2017-10-23 DIAGNOSIS — M62838 Other muscle spasm: Secondary | ICD-10-CM

## 2017-10-23 DIAGNOSIS — M5440 Lumbago with sciatica, unspecified side: Secondary | ICD-10-CM

## 2017-10-23 DIAGNOSIS — M6281 Muscle weakness (generalized): Secondary | ICD-10-CM

## 2017-10-24 NOTE — Therapy (Signed)
[facility] [address], [place], [address] Phone: [phone]   Fax:  [phone]  Physical Therapy Treatment  Patient Details  Name: Andrea Holden MRN: [id number] Date of Birth: [birth year] Referring Provider: Marene Lenz DO   Encounter Date: 10/23/2017  PT End of Session - 10/23/17 1531    Visit Number  24    Number of Visits  38    Date for PT Re-Evaluation  12/18/17    Authorization Type   4 of 10 (progress)    PT Start Time  1438    PT Stop Time  1520    PT Time Calculation (min)  42 min    Activity Tolerance  Patient tolerated treatment well    Behavior During Therapy  [facility] for tasks assessed/performed       Past Medical History:  Diagnosis Date  . Allergy    seasonal allergies  . Anemia   . Anxiety   . Arthritis   . Asthma    allergy induced asthma  . Cataract   . Clotting disorder (HCC)    history of blood clots  . Complication of anesthesia    arrhythmia following colonoscopy  . Degenerative disc disease, lumbar   . Degenerative disc disease, lumbar   . Depression   . Diabetes mellitus   . Diabetic neuropathy (HCC) 11/16/2015  . Dysrhythmia   . GERD (gastroesophageal reflux disease)   . H/O eating disorder   . H/O transfusion    patient was given 5 units of blood while at [facility], blood type O+  . History of chicken pox   . History of hiatal hernia   . History of measles, mumps, or rubella   . HOH (hard of hearing)   . Hyperlipidemia   . Hypertension   . Irregular heartbeat   . Moderate COPD (chronic obstructive pulmonary disease) (HCC) 12/14/2014  . Neuropathy   . Opiate use 11/16/2015  . Orthopnea   . Pes planus of both feet 11/16/2015  . Plantar fasciitis of right foot 11/16/2015  . Wheezing     Past Surgical History:  Procedure Laterality Date  . ABDOMINAL HYSTERECTOMY    . CATARACT EXTRACTION W/PHACO Right 05/30/2015   Procedure: CATARACT EXTRACTION PHACO  AND INTRAOCULAR LENS PLACEMENT (IOC);  Surgeon: Galen Manila, MD;  Location: [facility] ORS;  Service: Ophthalmology;  Laterality: Right;  [place] 00:57 AP% 20.9 CDE 11.99 fluid pack lot #[id number]  . CHOLECYSTECTOMY    . COLON SURGERY     blocked colon  . FOOT FUSION Right 2018  . GALLBLADDER SURGERY    . GASTRIC BYPASS  2010  . internal bleeding  2016  . MOUTH SURGERY    . OVARY SURGERY    . SKIN GRAFT Right 2018   RT foot  . TENOTOMY ACHILLES TENDON     Percuntaneous  . TONSILLECTOMY      There were no vitals filed for this visit.  Subjective Assessment - 10/23/17 1439    Subjective  Patient reports continued constant back pain that is improved following physical therapy treatment. She continues with pain in right foot and lower leg that limits ability to walk without difficulty    Pertinent History  chronic back pain and foot pain over many years since 1995 with previous physical therapy with good results. Patient reports falling 06/02/17 which caused increased back pain and she is now waing to have evaluation by spine specialist    Limitations  Sitting;Standing;Walking;House hold activities    How long can you sit comfortably?  <1 hour    How long can you stand comfortably?  <20 min    How long can you walk comfortably?  only short distances    Patient Stated Goals  Patient wants to be abe to walk more normally with less pain in back to return to normal activities     Currently in Pain?  Yes    Pain Score  4     Pain Location  Back    Pain Orientation  Mid;Lower;Upper    Pain Descriptors / Indicators  Aching    Pain Type  Chronic pain    Pain Onset  More than a month ago    Pain Frequency  Constant        Objective:  Palpation: spasms and increased tenderness along  Bilateral paraspinal muscles thoracic to lumbar spine, decreased from previous session, point tender suboccipitally right and left Gait: ambulating independently with cane with decreased trunk rotation, and  increased lateral trunk lean to right with stance. Decreased push off right, short step lengths bilaterally MODI 40% Strength: grossly assessed: in sitting: decreased right hip flexion 4-/5, right ankle DF 3+/5; PF 3-/5 (within available ROM)      Treatment: Manual Therapy: 15 min. Goal: spasm, pain STM performed to back from cervical spine, thoracic to lumbar region with concentration on bilateral lower lumbar region superficial and deep techniques seated in massage chair: patient transfers on and off massage chair with close supervision and assistance of therapist to stabilize chair    Therapeutic exercise: patient performed with demonstration, verbal cues and assistance of therapist: goal: strenth, improved endurance, function   Sitting:  Hip adduction with ball and glute sets x 15 Hip abduction with doubled blue resistive band  x 15 Knee flexion bilaterally blue resistive band 2 x 15 Bilateral rows with green x 15 and doubled blue resistive band  x 15 Straight arm pull downs with blue resistive band 2 x 15 Single arm rows doubled green resistive band x 15 reps each  NuStep x 10 min level #4 intensity independent at end of session ( required assistance to get on and off NUStep)   Patient was escorted to her car with supervision for safety at end of session   Patient response to treatment: Patient demonstrated improved technique with exercises with repeated VC and demonstration. Patient with decreased pain from 4/10 to  3/10. Improved soft tissue elasticity 50% following STM.      PT Education - 10/23/17 1530    Education provided  Yes    Education Details  exercise instruction    Person(s) Educated  Patient    Methods  Explanation;Demonstration;Verbal cues    Comprehension  Verbalized understanding;Returned demonstration;Verbal cues required          PT Long Term Goals - 10/23/17 1545      PT LONG TERM GOAL #1   Title  Patient will improve MODI score to 40% or better  demonstrating improved function with daily tasks and able to stand, sit and walk with less difficulty    Baseline  MODI 55%; 45% 09/09/17; 10/23/17 40%    Status  Achieved      PT LONG TERM GOAL #2   Title  Patient will ben independent with home program for pain control and progressive exercises for flexibility and strength in lumbar spine/right LE  in order to transition to home program     Baseline  limited  knowldge of appropriate exercises and progression without cuing, instruction    Status  Revised    Target Date  12/18/17      PT LONG TERM GOAL #3   Title  Patient will improve MODI score to 30% or better demonstrating improved function with daily tasks and able to stand, sit and walk with less difficulty    Baseline  MODI 55%; 40% 10/23/17    Status  Revised    Target Date  12/18/17            Plan - 10/23/17 1543    Clinical Impression Statement  Patient demonstrates continued steady progress towards goals with improved MODI socre of 40%. She requires guidance and VC to perform exercises with correct technique. She continues with back spasms, constand 4/10 pain that limit daily function and she will benefit from continued physical therapy intervention to achieve goals.     Rehab Potential  Fair    Clinical Impairments Affecting Rehab Potential  (+) motivated (-) muliple co morbidities, chronic condition    PT Frequency  2x / week    PT Duration  8 weeks    PT Treatment/Interventions  Electrical Stimulation;Moist Heat;Ultrasound;Patient/family education;Neuromuscular re-education;Therapeutic exercise;Manual techniques    PT Next Visit Plan  pain control, ther. ex, manual therapy    PT Home Exercise Plan  stabilization exercises; observe good body mechanics for daily tasks    Consulted and Agree with Plan of Care  Patient       Patient will benefit from skilled therapeutic intervention in order to improve the following deficits and impairments:  Decreased strength, Impaired  flexibility, Decreased activity tolerance, Impaired perceived functional ability, Pain, Decreased endurance, Difficulty walking  Visit Diagnosis: Other muscle spasm - Plan: PT plan of care cert/re-cert  Chronic bilateral low back pain with sciatica, sciatica laterality unspecified - Plan: PT plan of care cert/re-cert  Muscle weakness (generalized) - Plan: PT plan of care cert/re-cert     Problem List Patient Active Problem List   Diagnosis Date Noted  . Assistance needed with transportation 05/26/2017  . Financial difficulties 05/26/2017  . Needs assistance with community resources 05/26/2017  . Moderate recurrent major depression (HCC) 05/26/2017  . Controlled substance agreement broken 10/11/2016  . Breast cancer screening 06/18/2016  . Knee pain 04/03/2016  . Hyponatremia 03/01/2016  . High triglycerides 12/24/2015  . Pes planus of both feet 11/16/2015  . Plantar fasciitis of right foot 11/16/2015  . Heel spur 11/16/2015  . Diabetic neuropathy (HCC) 11/16/2015  . Right ankle pain 11/16/2015  . Medication monitoring encounter 11/16/2015  . Diabetes mellitus without complication (HCC) 08/15/2015  . Chronic pain of multiple joints 08/15/2015  . Abnormal mammogram of right breast 06/19/2015  . Cerumen impaction 03/16/2015  . Hypertension goal BP (blood pressure) < 140/90 12/14/2014  . Hyperlipidemia LDL goal <100 12/14/2014  . History of transfusion of packed RBC 12/14/2014  . COPD, mild (HCC) 12/14/2014  . Hx of smoking 12/14/2014  . Status post bariatric surgery 12/14/2014  . Morbid obesity (HCC) 12/14/2014  . Chronic radicular lumbar pain 12/14/2014  . History of GI bleed 11/12/2014  . GERD without esophagitis 11/12/2014  . History of small bowel obstruction 09/07/2014    Beacher MayBrooks, Marie PT 10/24/2017, 11:38 AM  [facility] [address] [place], [place], [address] Phone: [phone]   Fax:   [phone]  Name: Andrea Holden MRN: [id number] Date of Birth: 09-30-1945

## 2017-10-27 ENCOUNTER — Telehealth: Payer: Self-pay | Admitting: Family Medicine

## 2017-10-27 NOTE — Telephone Encounter (Signed)
Copied from CRM #[id number]. Topic: Quick Communication - Rx Refill/Question °>> Oct 27, 2017  4:05 PM Lambe, Annette S wrote: °Medication: °sertraline (ZOLOFT) 100 MG tablet ° (office sent over 3 prescriptions and suppose to send each prescriptions with 3 refills)  She is asking if there is possible to have 1 pill at 150mg. So she does not have to cut in half)  ° °lisinopril (PRINIVIL,ZESTRIL) 5 MG tablet  (This is suppose to be at 10mg. ) ° °Has the patient contacted their pharmacy? Yes  °     °(Agent: If no, request that the patient contact the pharmacy for the refill.) °Preferred Pharmacy (with phone number or street name): ° °[facility] [place] - [address]  °[address] [place] [address]  °Phone: [phone] Fax: [phone]  ° ° °Agent: Please be advised that RX refills may take up to 3 business days. We ask that you follow-up with your pharmacy. ° °

## 2017-10-27 NOTE — Telephone Encounter (Signed)
Copied from CRM [id number]. Topic: Quick Communication - Rx Refill/Question >> Oct 27, 2017  4:05 PM Cipriano Bunker wrote: Medication: sertraline (ZOLOFT) 100 MG tablet  (office sent over 3 prescriptions and suppose to send each prescriptions with 3 refills)  She is asking if there is possible to have 1 pill at . So she does not have to cut in half)   lisinopril (PRINIVIL,ZESTRIL) 5 MG tablet  (This is suppose to be at . )  Has the patient contacted their pharmacy? Yes       (Agent: If no, request that the patient contact the pharmacy for the refill.) Preferred Pharmacy (with phone number or street name):  [facility] - [place], [place] - [address]  [address] [place] [address]  Phone: [phone] Fax: [phone]    Agent: Please be advised that RX refills may take up to 3 business days. We ask that you follow-up with your pharmacy.

## 2017-10-27 NOTE — Telephone Encounter (Signed)
Copied from CRM [id number]. Topic: Quick Communication - See Telephone Encounter >> Oct 27, 2017  4:05 PM Terisa Starr wrote: CRM for notification. See Telephone encounter for: 10/27/17.  [place] pharmacy called and said that she told them she needs a refill on lisinopril (PRINIVIL,ZESTRIL) 5 MG tablet, she said that in September she was increased to . He needs to know which milligram to fill?   sertraline (ZOLOFT) 100 MG tablet

## 2017-10-28 ENCOUNTER — Encounter: Payer: Self-pay | Admitting: Physical Therapy

## 2017-10-28 ENCOUNTER — Ambulatory Visit: Payer: Medicare HMO | Admitting: Physical Therapy

## 2017-10-28 DIAGNOSIS — M5440 Lumbago with sciatica, unspecified side: Secondary | ICD-10-CM

## 2017-10-28 DIAGNOSIS — M6281 Muscle weakness (generalized): Secondary | ICD-10-CM | POA: Diagnosis not present

## 2017-10-28 DIAGNOSIS — M62838 Other muscle spasm: Secondary | ICD-10-CM | POA: Diagnosis not present

## 2017-10-28 DIAGNOSIS — G8929 Other chronic pain: Secondary | ICD-10-CM | POA: Diagnosis not present

## 2017-10-28 NOTE — Therapy (Signed)
[facility] [address] [place], [address] Phone: [phone]   Fax:  [phone]  Physical Therapy Treatment  Patient Details  Name: Andrea Holden MRN: [id number] Date of Birth: 1946/06/16 Referring Provider: Marene Lenz DO   Encounter Date: 10/28/2017  PT End of Session - 10/28/17 1314    Visit Number  25    Number of Visits  38    Date for PT Re-Evaluation  12/18/17    Authorization Type   5 of 10 (progress)    PT Start Time  1305    PT Stop Time  1350    PT Time Calculation (min)  45 min    Activity Tolerance  Patient tolerated treatment well    Behavior During Therapy  [facility] for tasks assessed/performed       Past Medical History:  Diagnosis Date  . Allergy    seasonal allergies  . Anemia   . Anxiety   . Arthritis   . Asthma    allergy induced asthma  . Cataract   . Clotting disorder (HCC)    history of blood clots  . Complication of anesthesia    arrhythmia following colonoscopy  . Degenerative disc disease, lumbar   . Degenerative disc disease, lumbar   . Depression   . Diabetes mellitus   . Diabetic neuropathy (HCC) 11/16/2015  . Dysrhythmia   . GERD (gastroesophageal reflux disease)   . H/O eating disorder   . H/O transfusion    patient was given 5 units of blood while at [facility], blood type O+  . History of chicken pox   . History of hiatal hernia   . History of measles, mumps, or rubella   . HOH (hard of hearing)   . Hyperlipidemia   . Hypertension   . Irregular heartbeat   . Moderate COPD (chronic obstructive pulmonary disease) (HCC) 12/14/2014  . Neuropathy   . Opiate use 11/16/2015  . Orthopnea   . Pes planus of both feet 11/16/2015  . Plantar fasciitis of right foot 11/16/2015  . Wheezing     Past Surgical History:  Procedure Laterality Date  . ABDOMINAL HYSTERECTOMY    . CATARACT EXTRACTION W/PHACO Right 05/30/2015   Procedure: CATARACT EXTRACTION PHACO  AND INTRAOCULAR LENS PLACEMENT (IOC);  Surgeon: Galen Manila, MD;  Location: [facility] ORS;  Service: Ophthalmology;  Laterality: Right;  [place] 00:57 AP% 20.9 CDE 11.99 fluid pack lot #[id number]  . CHOLECYSTECTOMY    . COLON SURGERY     blocked colon  . FOOT FUSION Right 2018  . GALLBLADDER SURGERY    . GASTRIC BYPASS  2010  . internal bleeding  2016  . MOUTH SURGERY    . OVARY SURGERY    . SKIN GRAFT Right 2018   RT foot  . TENOTOMY ACHILLES TENDON     Percuntaneous  . TONSILLECTOMY      There were no vitals filed for this visit.  Subjective Assessment - 10/28/17 1313    Subjective  Patient reports continued constant back pain that is improved following physical therapy treatment. She continues with pain in right foot and lower leg that limits ability to walk without difficulty    Pertinent History  chronic back pain and foot pain over many years since 1995 with previous physical therapy with good results. Patient reports falling 06/02/17 which caused increased back pain and she is now waing to have evaluation by spine specialist    Limitations  Sitting;Standing;Walking;House hold activities    How long can you sit comfortably?  <1 hour    How long can you stand comfortably?  <20 min    How long can you walk comfortably?  only short distances    Patient Stated Goals  Patient wants to be abe to walk more normally with less pain in back to return to normal activities     Currently in Pain?  Yes    Pain Score  4     Pain Location  Back    Pain Orientation  Mid;Lower    Pain Descriptors / Indicators  Aching    Pain Type  Chronic pain    Pain Onset  More than a month ago    Pain Frequency  Constant         Objective:  Palpation: spasms and increased tenderness along  Bilateral paraspinal muscles thoracic to lumbar spine, left>right side  Gait: ambulating independently with cane with decreased trunk rotation, and increased lateral trunk lean to right with stance. Decreased push off  right, short step lengths bilaterally  Treatment: Manual Therapy: 15 min. Goal: spasm, pain STM performed to back thoracic to lumbar region with concentration on bilateral lower lumbar region superficial and deep techniques seated in massage chair: patient transfers on and off massage chair with close supervision and assistance of therapist to stabilize chair    Therapeutic exercise: patient performed with demonstration, verbal cues and assistance of therapist: goal: strenth, improved endurance, function   Sitting:  Hip adduction with ball and glute sets x 15 2# ankle weights: roll ball back and forth under each foot x 25 2# ankle weights: knee extension with tapping balance stone in front x 5 reps each LE Hip abduction with doubled blue resistive band  x 15 Knee flexion bilaterally blue resistive band 2 x 15  Bilateral rows with doubled blue resistive band  x 15 Straight arm pull downs with blue resistive band  x 15 Single arm rows doubled blue resistive band x 15 reps each  Standing at counter for UE support: Side step x 20 reps to right and left (2 steps at a time) with blue resistive band around thighs Hip abduction and extension x 10 reps each LE with blue resistive band around thighs: more difficulty with right LE for abduction and extension with reprted increased left sided LBP  Patient was escorted to her car with supervision for safety at end of session   Patient response to treatment: Patient improved technique with exercises with minimal cuing and repetition. improved soft tissue elasticity left side lumbar spine and upper back left side by 50% following STM. Pain in lower back increased with standing exercises and decreased back to ~3/10 following STM at end of session.       PT Education - 10/28/17 1314    Education provided  Yes    Education Details  exercise instruction    Person(s) Educated  Patient    Methods  Demonstration;Explanation;Verbal cues    Comprehension   Verbalized understanding;Returned demonstration;Verbal cues required          PT Long Term Goals - 10/23/17 1545      PT LONG TERM GOAL #1   Title  Patient will improve MODI score to 40% or better demonstrating improved function with daily tasks and able to stand, sit and walk with less difficulty    Baseline  MODI 55%; 45% 09/09/17; 10/23/17 40%    Status  Achieved  PT LONG TERM GOAL #2   Title  Patient will ben independent with home program for pain control and progressive exercises for flexibility and strength in lumbar spine/right LE  in order to transition to home program     Baseline  limited knowldge of appropriate exercises and progression without cuing, instruction    Status  Revised    Target Date  12/18/17      PT LONG TERM GOAL #3   Title  Patient will improve MODI score to 30% or better demonstrating improved function with daily tasks and able to stand, sit and walk with less difficulty    Baseline  MODI 55%; 40% 10/23/17    Status  Revised    Target Date  12/18/17            Plan - 10/28/17 1414    Clinical Impression Statement  Patient demonstrated improved endurance with standing exercises with increased left sided back soreness due to weakness in trunk and LE's. She is responding well to therapeutic exercise and manual techniques and should continue to improve with additional physical therapy intervention.     Rehab Potential  Fair    Clinical Impairments Affecting Rehab Potential  (+) motivated (-) muliple co morbidities, chronic condition    PT Frequency  2x / week    PT Duration  8 weeks    PT Treatment/Interventions  Electrical Stimulation;Moist Heat;Ultrasound;Patient/family education;Neuromuscular re-education;Therapeutic exercise;Manual techniques    PT Next Visit Plan  pain control, ther. ex, manual therapy    PT Home Exercise Plan  stabilization exercises; observe good body mechanics for daily tasks       Patient will benefit from skilled  therapeutic intervention in order to improve the following deficits and impairments:  Decreased strength, Impaired flexibility, Decreased activity tolerance, Impaired perceived functional ability, Pain, Decreased endurance, Difficulty walking  Visit Diagnosis: Other muscle spasm  Chronic bilateral low back pain with sciatica, sciatica laterality unspecified  Muscle weakness (generalized)     Problem List Patient Active Problem List   Diagnosis Date Noted  . Assistance needed with transportation 05/26/2017  . Financial difficulties 05/26/2017  . Needs assistance with community resources 05/26/2017  . Moderate recurrent major depression (HCC) 05/26/2017  . Controlled substance agreement broken 10/11/2016  . Breast cancer screening 06/18/2016  . Knee pain 04/03/2016  . Hyponatremia 03/01/2016  . High triglycerides 12/24/2015  . Pes planus of both feet 11/16/2015  . Plantar fasciitis of right foot 11/16/2015  . Heel spur 11/16/2015  . Diabetic neuropathy (HCC) 11/16/2015  . Right ankle pain 11/16/2015  . Medication monitoring encounter 11/16/2015  . Diabetes mellitus without complication (HCC) 08/15/2015  . Chronic pain of multiple joints 08/15/2015  . Abnormal mammogram of right breast 06/19/2015  . Cerumen impaction 03/16/2015  . Hypertension goal BP (blood pressure) < 140/90 12/14/2014  . Hyperlipidemia LDL goal <100 12/14/2014  . History of transfusion of packed RBC 12/14/2014  . COPD, mild (HCC) 12/14/2014  . Hx of smoking 12/14/2014  . Status post bariatric surgery 12/14/2014  . Morbid obesity (HCC) 12/14/2014  . Chronic radicular lumbar pain 12/14/2014  . History of GI bleed 11/12/2014  . GERD without esophagitis 11/12/2014  . History of small bowel obstruction 09/07/2014    Beacher May PT 10/28/2017, 2:16 PM  [facility] PHYS[facility] AND SPORTS MEDICINE [address] [place], [address] Phone: [phone]   Fax:   [phone]  Name: Andrea Holden MRN: [id number] Date of Birth: Jun 02, 1946

## 2017-10-30 ENCOUNTER — Encounter: Payer: Self-pay | Admitting: Physical Therapy

## 2017-10-30 ENCOUNTER — Ambulatory Visit: Payer: Medicare HMO | Attending: Physical Medicine and Rehabilitation | Admitting: Physical Therapy

## 2017-10-30 DIAGNOSIS — M5440 Lumbago with sciatica, unspecified side: Secondary | ICD-10-CM | POA: Diagnosis not present

## 2017-10-30 DIAGNOSIS — G8929 Other chronic pain: Secondary | ICD-10-CM | POA: Diagnosis not present

## 2017-10-30 DIAGNOSIS — M62838 Other muscle spasm: Secondary | ICD-10-CM | POA: Insufficient documentation

## 2017-10-30 DIAGNOSIS — M6281 Muscle weakness (generalized): Secondary | ICD-10-CM | POA: Diagnosis not present

## 2017-10-30 NOTE — Therapy (Signed)
[facility] PHYS[facility] SPORTS MEDICINE [address] [place], [address] Phone: [phone]   Fax:  [phone]  Physical Therapy Treatment  Patient Details  Name: Andrea Holden MRN: [id number] Date of Birth: October 13, 1945 Referring Provider: Marene Lenz DO   Encounter Date: 10/30/2017  PT End of Session - 10/30/17 1314    Visit Number  26    Number of Visits  38    Date for PT Re-Evaluation  12/18/17    Authorization Type   6 of 10 (progress)    PT Start Time  1302    PT Stop Time  1346    PT Time Calculation (min)  44 min    Activity Tolerance  Patient tolerated treatment well    Behavior During Therapy  [facility] for tasks assessed/performed       Past Medical History:  Diagnosis Date  . Allergy    seasonal allergies  . Anemia   . Anxiety   . Arthritis   . Asthma    allergy induced asthma  . Cataract   . Clotting disorder (HCC)    history of blood clots  . Complication of anesthesia    arrhythmia following colonoscopy  . Degenerative disc disease, lumbar   . Degenerative disc disease, lumbar   . Depression   . Diabetes mellitus   . Diabetic neuropathy (HCC) 11/16/2015  . Dysrhythmia   . GERD (gastroesophageal reflux disease)   . H/O eating disorder   . H/O transfusion    patient was given 5 units of blood while at [facility], blood type O+  . History of chicken pox   . History of hiatal hernia   . History of measles, mumps, or rubella   . HOH (hard of hearing)   . Hyperlipidemia   . Hypertension   . Irregular heartbeat   . Moderate COPD (chronic obstructive pulmonary disease) (HCC) 12/14/2014  . Neuropathy   . Opiate use 11/16/2015  . Orthopnea   . Pes planus of both feet 11/16/2015  . Plantar fasciitis of right foot 11/16/2015  . Wheezing     Past Surgical History:  Procedure Laterality Date  . ABDOMINAL HYSTERECTOMY    . CATARACT EXTRACTION W/PHACO Right 05/30/2015   Procedure: CATARACT EXTRACTION PHACO AND  INTRAOCULAR LENS PLACEMENT (IOC);  Surgeon: Galen Manila, MD;  Location: [facility] ORS;  Service: Ophthalmology;  Laterality: Right;  [place] 00:57 AP% 20.9 CDE 11.99 fluid pack lot #[id number]  . CHOLECYSTECTOMY    . COLON SURGERY     blocked colon  . FOOT FUSION Right 2018  . GALLBLADDER SURGERY    . GASTRIC BYPASS  2010  . internal bleeding  2016  . MOUTH SURGERY    . OVARY SURGERY    . SKIN GRAFT Right 2018   RT foot  . TENOTOMY ACHILLES TENDON     Percuntaneous  . TONSILLECTOMY      There were no vitals filed for this visit.  Subjective Assessment - 10/30/17 1310    Subjective  Patient reports she is not feeling well. Patient reports she went to dentist yesterday and bit into her cheek/lip and then didn't eat and has had an upset stomach this morniing.     Pertinent History  chronic back pain and foot pain over many years since 1995 with previous physical therapy with good results. Patient reports falling 06/02/17 which caused increased back pain and she is now waing to have evaluation by spine specialist  Limitations  Sitting;Standing;Walking;House hold activities    How long can you sit comfortably?  <1 hour    How long can you stand comfortably?  <20 min    How long can you walk comfortably?  only short distances    Patient Stated Goals  Patient wants to be abe to walk more normally with less pain in back to return to normal activities     Currently in Pain?  Yes    Pain Score  4     Pain Location  Back    Pain Orientation  Lower;Mid    Pain Descriptors / Indicators  Aching    Pain Type  Chronic pain    Pain Onset  More than a month ago    Pain Frequency  Constant          Objective:  Gait: ambulating independently with cane with decreased trunk rotation, and increased lateral trunk lean to right with stance. Decreased push off right, short step lengths bilaterally   Treatment: Manual Therapy: 13 min. Goal: spasm, pain STM performed to back thoracic to lumbar region  with concentration on bilateral lower lumbar region superficial techniques seated in massage chair: patient transfers on and off massage chair with close supervision and assistance of therapist to stabilize chair    Therapeutic exercise: patient performed with demonstration, verbal cues and assistance of therapist: goal: strenth, improved endurance, function   Sitting:  Hip adduction with ball and glute sets x 15 knee extension with tapping balance stone in front x 15 reps each LE Hip abduction with blue resistive band  x 15 Knee flexion bilaterally blue resistive band 2 x 15   Bilateral rows with blue resistive band  x 15 Straight arm pull downs with blue resistive band  x 15 Single arm rows blue resistive band x 15 reps each   Patient was escorted to her car with supervision for safety at end of session  Patient response to treatment: patient demonstrated improved technique with exercises with minimal VC for correct alignment. Patient with decreased pain from 4/10 to 3/10. Patient with improved soft tissue elasticity 50% following STM     PT Education - 10/30/17 1314    Education provided  Yes    Education Details  exercise instruction    Person(s) Educated  Patient    Methods  Explanation;Demonstration;Verbal cues    Comprehension  Verbalized understanding;Returned demonstration;Verbal cues required          PT Long Term Goals - 10/23/17 1545      PT LONG TERM GOAL #1   Title  Patient will improve MODI score to 40% or better demonstrating improved function with daily tasks and able to stand, sit and walk with less difficulty    Baseline  MODI 55%; 45% 09/09/17; 10/23/17 40%    Status  Achieved      PT LONG TERM GOAL #2   Title  Patient will ben independent with home program for pain control and progressive exercises for flexibility and strength in lumbar spine/right LE  in order to transition to home program     Baseline  limited knowldge of appropriate exercises and  progression without cuing, instruction    Status  Revised    Target Date  12/18/17      PT LONG TERM GOAL #3   Title  Patient will improve MODI score to 30% or better demonstrating improved function with daily tasks and able to stand, sit and walk with less difficulty  Baseline  MODI 55%; 40% 10/23/17    Status  Revised    Target Date  12/18/17            Plan - 10/30/17 1400    Clinical Impression Statement  Limited session due to patient eating at beginning of session to calm her stomach upset and her not feeling well for extended exercise. Modified exercises to decrease intensity and repetition with good results. Patient will benefit from additional physical therapy intervention to improve strength, endurance for function with daily tasks.     Rehab Potential  Fair    Clinical Impairments Affecting Rehab Potential  (+) motivated (-) muliple co morbidities, chronic condition    PT Frequency  2x / week    PT Duration  8 weeks    PT Treatment/Interventions  Electrical Stimulation;Moist Heat;Ultrasound;Patient/family education;Neuromuscular re-education;Therapeutic exercise;Manual techniques    PT Next Visit Plan  pain control, ther. ex, manual therapy    PT Home Exercise Plan  stabilization exercises; observe good body mechanics for daily tasks       Patient will benefit from skilled therapeutic intervention in order to improve the following deficits and impairments:  Decreased strength, Impaired flexibility, Decreased activity tolerance, Impaired perceived functional ability, Pain, Decreased endurance, Difficulty walking  Visit Diagnosis: Other muscle spasm  Chronic bilateral low back pain with sciatica, sciatica laterality unspecified  Muscle weakness (generalized)     Problem List Patient Active Problem List   Diagnosis Date Noted  . Assistance needed with transportation 05/26/2017  . Financial difficulties 05/26/2017  . Needs assistance with community resources  05/26/2017  . Moderate recurrent major depression (HCC) 05/26/2017  . Controlled substance agreement broken 10/11/2016  . Breast cancer screening 06/18/2016  . Knee pain 04/03/2016  . Hyponatremia 03/01/2016  . High triglycerides 12/24/2015  . Pes planus of both feet 11/16/2015  . Plantar fasciitis of right foot 11/16/2015  . Heel spur 11/16/2015  . Diabetic neuropathy (HCC) 11/16/2015  . Right ankle pain 11/16/2015  . Medication monitoring encounter 11/16/2015  . Diabetes mellitus without complication (HCC) 08/15/2015  . Chronic pain of multiple joints 08/15/2015  . Abnormal mammogram of right breast 06/19/2015  . Cerumen impaction 03/16/2015  . Hypertension goal BP (blood pressure) < 140/90 12/14/2014  . Hyperlipidemia LDL goal <100 12/14/2014  . History of transfusion of packed RBC 12/14/2014  . COPD, mild (HCC) 12/14/2014  . Hx of smoking 12/14/2014  . Status post bariatric surgery 12/14/2014  . Morbid obesity (HCC) 12/14/2014  . Chronic radicular lumbar pain 12/14/2014  . History of GI bleed 11/12/2014  . GERD without esophagitis 11/12/2014  . History of small bowel obstruction 09/07/2014    Beacher May PT 10/31/2017, 9:02 AM  [facility] PHYSICAL AND SPORTS MEDICINE [address] [place], [address] Phone: [phone]   Fax:  [phone]  Name: MARDEL GRUDZIEN MRN: [id number] Date of Birth: [birth year]

## 2017-11-04 ENCOUNTER — Other Ambulatory Visit: Payer: Self-pay

## 2017-11-04 MED ORDER — SERTRALINE HCL 100 MG PO TABS: 150.0000 mg | ORAL_TABLET | Freq: Every day | ORAL | 1 refills | Status: DC

## 2017-11-05 ENCOUNTER — Encounter: Payer: Medicare HMO | Attending: Family Medicine | Admitting: Dietician

## 2017-11-05 ENCOUNTER — Encounter: Payer: Self-pay | Admitting: Dietician

## 2017-11-05 VITALS — Ht 62.0 in | Wt 216.8 lb

## 2017-11-05 DIAGNOSIS — Z713 Dietary counseling and surveillance: Secondary | ICD-10-CM | POA: Insufficient documentation

## 2017-11-05 DIAGNOSIS — E119 Type 2 diabetes mellitus without complications: Secondary | ICD-10-CM | POA: Diagnosis not present

## 2017-11-05 DIAGNOSIS — Z6838 Body mass index (BMI) 38.0-38.9, adult: Secondary | ICD-10-CM

## 2017-11-05 DIAGNOSIS — E6609 Other obesity due to excess calories: Secondary | ICD-10-CM

## 2017-11-05 NOTE — Patient Instructions (Addendum)
   Increase water intake as able. Fluid goal for each day is about 64oz  Continue working to resume and increase physical activity once tooth pain resolves.   Use fruit for snacks, ideally combined with a small portion of protein such as 1/4-1/2c. cottage cheese, 1-2Tbsp peanut butter, or when able 2-4Tbsp nuts.   Continue recording food intake.

## 2017-11-05 NOTE — Progress Notes (Signed)
Medical Nutrition Therapy: Visit start time: 1000  end time: 1100  Assessment:  Diagnosis: diabetes, obesity Medical history changes: no changes per patient Psychosocial issues/ stress concerns: none  Current weight: 216.8lbs  Height: 5'2" Medications, supplement changes: no changes Progress and evaluation: Patient reports some difficulty over the past 2 weeks, eating some extra foods, tooth pain -- has dental appt today. She was out of her Sertraline for a while and has just resumed taking it yesterday, so some increase in depression symptoms over the past 2 weeks.  Physical activity: unable recently due to significant tooth pain when bending over; trainer on vacation  Dietary Intake:  Usual eating pattern includes 3 meals and 1 snacks per day. Dining out frequency: not assessed today.  Breakfast: oatmeal with fruit and nuts (when not in pain) Snack: none Lunch: sandwich with ham; cottage cheese and tomatoes Snack: sometimes sweets recently Supper: 5/7 chicken taco Snack: popcorn Beverages: coffee, diet coke, water  Nutrition Care Education: Topics covered: weight control Weight control: reviewed progress since previous visit, and food diary; discussed options for decreasing intake of sweets Other:  Resuming regular exercise  Nutritional Diagnosis:  Omro-3.3 Overweight/obesity As related to excess calories, inactivity.  As evidenced by pateint with BMI of 39.6, patient report of increased caloric intake and decreased activity.  Intervention: Instruction as noted above.   Updated goals with direction from patient; she will continue working on goals as set at initial visit.  Education Materials given:  Marland Kitchen Goals/ instructions   Learner/ who was taught:  . Patient   Level of understanding: Marland Kitchen Verbalizes/ demonstrates competency   Demonstrated degree of understanding via:   Teach back Learning barriers: . None  Willingness to learn/ readiness for change: . Eager, change in  progress  Monitoring and Evaluation:  Dietary intake, exercise, and body weight      follow up: 12/03/17

## 2017-11-06 ENCOUNTER — Telehealth: Payer: Self-pay

## 2017-11-06 NOTE — Telephone Encounter (Signed)
Copied from CRM [id number]. Topic: General - Other >> Nov 06, 2017  1:11 PM Oneal Grout wrote: CRM for notification 11/06/17. Needing a new handicap placard, States its urgent, wants to pick up today >> Nov 06, 2017  2:00 PM Alexander Bergeron B wrote: Pt called to ask about plackard and why her pcp cant go onto the [facility] website to print one out and fill it out, call pt to advise

## 2017-11-06 NOTE — Telephone Encounter (Signed)
Spoke with patient stated she would need to bring in place card and have her info filled out before Dr. lada would even sign it.

## 2017-11-11 ENCOUNTER — Encounter: Payer: Self-pay | Admitting: Physical Therapy

## 2017-11-11 ENCOUNTER — Ambulatory Visit: Payer: Medicare HMO | Admitting: Physical Therapy

## 2017-11-11 DIAGNOSIS — M62838 Other muscle spasm: Secondary | ICD-10-CM | POA: Diagnosis not present

## 2017-11-11 DIAGNOSIS — G8929 Other chronic pain: Secondary | ICD-10-CM

## 2017-11-11 DIAGNOSIS — M6281 Muscle weakness (generalized): Secondary | ICD-10-CM | POA: Diagnosis not present

## 2017-11-11 DIAGNOSIS — M5440 Lumbago with sciatica, unspecified side: Secondary | ICD-10-CM

## 2017-11-11 NOTE — Therapy (Signed)
[facility] PHYS[facility] SPORTS MEDICINE [address], [place], [address] Phone: [phone]   Fax:  [phone]  Physical Therapy Treatment  Patient Details  Name: Andrea Holden MRN: [id number] Date of Birth: 04-17-1946 Referring Provider: Marene Lenz DO   Encounter Date: 11/11/2017  PT End of Session - 11/11/17 1058    Visit Number  27    Number of Visits  38    Date for PT Re-Evaluation  12/18/17    Authorization Type   6 of 10 (progress)    PT Start Time  1042    PT Stop Time  1125    PT Time Calculation (min)  43 min    Activity Tolerance  Patient tolerated treatment well    Behavior During Therapy  [facility] for tasks assessed/performed       Past Medical History:  Diagnosis Date  . Allergy    seasonal allergies  . Anemia   . Anxiety   . Arthritis   . Asthma    allergy induced asthma  . Cataract   . Clotting disorder (HCC)    history of blood clots  . Complication of anesthesia    arrhythmia following colonoscopy  . Degenerative disc disease, lumbar   . Degenerative disc disease, lumbar   . Depression   . Diabetes mellitus   . Diabetic neuropathy (HCC) 11/16/2015  . Dysrhythmia   . GERD (gastroesophageal reflux disease)   . H/O eating disorder   . H/O transfusion    patient was given 5 units of blood while at [facility], blood type O+  . History of chicken pox   . History of hiatal hernia   . History of measles, mumps, or rubella   . HOH (hard of hearing)   . Hyperlipidemia   . Hypertension   . Irregular heartbeat   . Moderate COPD (chronic obstructive pulmonary disease) (HCC) 12/14/2014  . Neuropathy   . Opiate use 11/16/2015  . Orthopnea   . Pes planus of both feet 11/16/2015  . Plantar fasciitis of right foot 11/16/2015  . Wheezing     Past Surgical History:  Procedure Laterality Date  . ABDOMINAL HYSTERECTOMY    . CATARACT EXTRACTION W/PHACO Right 05/30/2015   Procedure: CATARACT EXTRACTION PHACO  AND INTRAOCULAR LENS PLACEMENT (IOC);  Surgeon: Galen Manila, MD;  Location: [facility] ORS;  Service: Ophthalmology;  Laterality: Right;  [place] 00:57 AP% 20.9 CDE 11.99 fluid pack lot #[id number]  . CHOLECYSTECTOMY    . COLON SURGERY     blocked colon  . FOOT FUSION Right 2018  . GALLBLADDER SURGERY    . GASTRIC BYPASS  2010  . internal bleeding  2016  . MOUTH SURGERY    . OVARY SURGERY    . SKIN GRAFT Right 2018   RT foot  . TENOTOMY ACHILLES TENDON     Percuntaneous  . TONSILLECTOMY      There were no vitals filed for this visit.  Subjective Assessment - 11/11/17 1051    Subjective  Patient reports she "twisted" her right  knee yesterday when walking in her home    Pertinent History  chronic back pain and foot pain over many years since 1995 with previous physical therapy with good results. Patient reports falling 06/02/17 which caused increased back pain and she is now waing to have evaluation by spine specialist    Limitations  Sitting;Standing;Walking;House hold activities    How long can you sit comfortably?  <1 hour  How long can you stand comfortably?  <20 min    How long can you walk comfortably?  only short distances    Patient Stated Goals  Patient wants to be abe to walk more normally with less pain in back to return to normal activities     Currently in Pain?  Yes    Pain Score  6     Pain Location  Knee    Pain Orientation  Right;Posterior    Pain Descriptors / Indicators  Aching;Discomfort    Pain Type  Chronic pain    Pain Onset  Yesterday    Pain Frequency  Intermittent        Objective:  Gait: ambulating independently with cane with decreased trunk rotation, and increased lateral trunk lean to right with stance.  Palpation: spasms mid back, thoracic spine, upper trapezius and cervical spine musculature   Treatment: Manual Therapy: 15 min. Goal: spasm, pain STM performed to back thoracic to lumbar region with concentration on bilateral lower lumbar region  superficial techniques seated in massage chair: patient transfers on and off massage chair with close supervision and assistance of therapist to stabilize chair    Therapeutic exercise: patient performed with demonstration, verbal cues and assistance of therapist: goal: strenth, improved endurance, function     Bilateral rows with doubled blue resistive band 2 x 15 Straight arm pull downs with blue resistive band  2 x 15 Single arm rows green resistive band x 15 reps each palloff press 25x to abdomen  Modalities: Ice pack applied to right knee throughout session x 15 min (unbilled time) for pain control; no adverse reaction noted   Patient was escorted to her car with supervision for safety at end of session   Patient response to treatment: improved soft tissue elasticity and decreased sensitivity in soft tissue by 50% following STM. Patient improved technique with exercises with minimal cuing and repetition.    PT Education - 11/11/17 1100    Education provided  Yes    Education Details  exercise instruction    Person(s) Educated  Patient    Methods  Explanation;Demonstration;Verbal cues    Comprehension  Verbalized understanding;Returned demonstration;Verbal cues required          PT Long Term Goals - 10/23/17 1545      PT LONG TERM GOAL #1   Title  Patient will improve MODI score to 40% or better demonstrating improved function with daily tasks and able to stand, sit and walk with less difficulty    Baseline  MODI 55%; 45% 09/09/17; 10/23/17 40%    Status  Achieved      PT LONG TERM GOAL #2   Title  Patient will ben independent with home program for pain control and progressive exercises for flexibility and strength in lumbar spine/right LE  in order to transition to home program     Baseline  limited knowldge of appropriate exercises and progression without cuing, instruction    Status  Revised    Target Date  12/18/17      PT LONG TERM GOAL #3   Title  Patient will  improve MODI score to 30% or better demonstrating improved function with daily tasks and able to stand, sit and walk with less difficulty    Baseline  MODI 55%; 40% 10/23/17    Status  Revised    Target Date  12/18/17            Plan - 11/11/17 1200    Clinical  Impression Statement  Patient is progressing slowly due to chronic pain and is responding well to exercise and soft tisssue mobilization. limited LE exercises today due to patient concern with right knee on arrival.    Rehab Potential  Fair    Clinical Impairments Affecting Rehab Potential  (+) motivated (-) muliple co morbidities, chronic condition    PT Frequency  2x / week    PT Duration  8 weeks    PT Treatment/Interventions  Electrical Stimulation;Moist Heat;Ultrasound;Patient/family education;Neuromuscular re-education;Therapeutic exercise;Manual techniques    PT Next Visit Plan  pain control, ther. ex, manual therapy    PT Home Exercise Plan  stabilization exercises; observe good body mechanics for daily tasks       Patient will benefit from skilled therapeutic intervention in order to improve the following deficits and impairments:  Decreased strength, Impaired flexibility, Decreased activity tolerance, Impaired perceived functional ability, Pain, Decreased endurance, Difficulty walking  Visit Diagnosis: Other muscle spasm  Chronic bilateral low back pain with sciatica, sciatica laterality unspecified  Muscle weakness (generalized)     Problem List Patient Active Problem List   Diagnosis Date Noted  . Assistance needed with transportation 05/26/2017  . Financial difficulties 05/26/2017  . Needs assistance with community resources 05/26/2017  . Moderate recurrent major depression (HCC) 05/26/2017  . Controlled substance agreement broken 10/11/2016  . Breast cancer screening 06/18/2016  . Knee pain 04/03/2016  . Hyponatremia 03/01/2016  . High triglycerides 12/24/2015  . Pes planus of both feet 11/16/2015   . Plantar fasciitis of right foot 11/16/2015  . Heel spur 11/16/2015  . Diabetic neuropathy (HCC) 11/16/2015  . Right ankle pain 11/16/2015  . Medication monitoring encounter 11/16/2015  . Diabetes mellitus without complication (HCC) 08/15/2015  . Chronic pain of multiple joints 08/15/2015  . Abnormal mammogram of right breast 06/19/2015  . Cerumen impaction 03/16/2015  . Hypertension goal BP (blood pressure) < 140/90 12/14/2014  . Hyperlipidemia LDL goal <100 12/14/2014  . History of transfusion of packed RBC 12/14/2014  . COPD, mild (HCC) 12/14/2014  . Hx of smoking 12/14/2014  . Status post bariatric surgery 12/14/2014  . Morbid obesity (HCC) 12/14/2014  . Chronic radicular lumbar pain 12/14/2014  . History of GI bleed 11/12/2014  . GERD without esophagitis 11/12/2014  . History of small bowel obstruction 09/07/2014    Beacher May PT 11/12/2017, 12:13 PM  [place] PHYSICAL AND SPORTS MEDICINE [address] [place], [address] Phone: [phone]   Fax:  [phone]  Name: Andrea Holden MRN: [id number] Date of Birth: [birth year]

## 2017-11-13 ENCOUNTER — Ambulatory Visit: Payer: Medicare HMO | Admitting: Physical Therapy

## 2017-11-13 ENCOUNTER — Encounter: Payer: Self-pay | Admitting: Physical Therapy

## 2017-11-13 DIAGNOSIS — G8929 Other chronic pain: Secondary | ICD-10-CM | POA: Diagnosis not present

## 2017-11-13 DIAGNOSIS — M62838 Other muscle spasm: Secondary | ICD-10-CM | POA: Diagnosis not present

## 2017-11-13 DIAGNOSIS — M5440 Lumbago with sciatica, unspecified side: Secondary | ICD-10-CM | POA: Diagnosis not present

## 2017-11-13 DIAGNOSIS — M6281 Muscle weakness (generalized): Secondary | ICD-10-CM | POA: Diagnosis not present

## 2017-11-13 NOTE — Therapy (Signed)
[facility] [address], [place], [address] Phone: [phone]   Fax:  [phone]  Physical Therapy Treatment  Patient Details  Name: Andrea Holden MRN: [id number] Date of Birth: 01/19/1946 Referring Provider: Marene Lenz DO   Encounter Date: 11/13/2017  PT End of Session - 11/13/17 1044    Visit Number  28    Number of Visits  38    Date for PT Re-Evaluation  12/18/17    Authorization Type   8 of 10 (progress)    PT Start Time  1035    PT Stop Time  1118    PT Time Calculation (min)  43 min    Activity Tolerance  Patient tolerated treatment well    Behavior During Therapy  [facility] for tasks assessed/performed       Past Medical History:  Diagnosis Date  . Allergy    seasonal allergies  . Anemia   . Anxiety   . Arthritis   . Asthma    allergy induced asthma  . Cataract   . Clotting disorder (HCC)    history of blood clots  . Complication of anesthesia    arrhythmia following colonoscopy  . Degenerative disc disease, lumbar   . Degenerative disc disease, lumbar   . Depression   . Diabetes mellitus   . Diabetic neuropathy (HCC) 11/16/2015  . Dysrhythmia   . GERD (gastroesophageal reflux disease)   . H/O eating disorder   . H/O transfusion    patient was given 5 units of blood while at [facility], blood type O+  . History of chicken pox   . History of hiatal hernia   . History of measles, mumps, or rubella   . HOH (hard of hearing)   . Hyperlipidemia   . Hypertension   . Irregular heartbeat   . Moderate COPD (chronic obstructive pulmonary disease) (HCC) 12/14/2014  . Neuropathy   . Opiate use 11/16/2015  . Orthopnea   . Pes planus of both feet 11/16/2015  . Plantar fasciitis of right foot 11/16/2015  . Wheezing     Past Surgical History:  Procedure Laterality Date  . ABDOMINAL HYSTERECTOMY    . CATARACT EXTRACTION W/PHACO Right 05/30/2015   Procedure: CATARACT EXTRACTION PHACO  AND INTRAOCULAR LENS PLACEMENT (IOC);  Surgeon: Galen Manila, MD;  Location: [facility] ORS;  Service: Ophthalmology;  Laterality: Right;  [place] 00:57 AP% 20.9 CDE 11.99 fluid pack lot #[id number]  . CHOLECYSTECTOMY    . COLON SURGERY     blocked colon  . FOOT FUSION Right 2018  . GALLBLADDER SURGERY    . GASTRIC BYPASS  2010  . internal bleeding  2016  . MOUTH SURGERY    . OVARY SURGERY    . SKIN GRAFT Right 2018   RT foot  . TENOTOMY ACHILLES TENDON     Percuntaneous  . TONSILLECTOMY      There were no vitals filed for this visit.  Subjective Assessment - 11/13/17 1039    Subjective  Patient reports she is still having pain in right knee posterior aspect and is using biofreeze to help with the pain     Pertinent History  chronic back pain and foot pain over many years since 1995 with previous physical therapy with good results. Patient reports falling 06/02/17 which caused increased back pain and she is now waing to have evaluation by spine specialist    Limitations  Sitting;Standing;Walking;House hold activities    How  long can you sit comfortably?  <1 hour    How long can you stand comfortably?  <20 min    How long can you walk comfortably?  only short distances    Patient Stated Goals  Patient wants to be abe to walk more normally with less pain in back to return to normal activities     Currently in Pain?  Yes    Pain Score  5     Pain Location  Knee posterior aspect and with extension    Pain Orientation  Right;Posterior    Pain Descriptors / Indicators  Aching;Discomfort    Pain Type  Acute pain    Pain Onset  In the past 7 days    Pain Frequency  Intermittent        Objective:  Gait: ambulating independently with cane unsteady gait, antalgic pattern with decreased trunk rotation, and increased lateral trunk lean to right with stance; required assistance with contact assist to and from treatment room and to car  Palpation: spasms mid back, thoracic spine    Treatment: Manual Therapy: 15 min. Goal: spasm, pain STM performed to back thoracic to lumbar region with concentration on bilateral lower lumbar region superficial techniques seated in massage chair: patient transfers on and off massage chair with close supervision and assistance of therapist to stabilize chair    Therapeutic exercise: patient performed with demonstration, verbal cues and assistance of therapist: goal: strenth, improved endurance, function     Bilateral rows with doubled blue resistive band 2 x 15 Straight arm pull downs with blue resistive band  2 x 15 Single arm rows green resistive band 2  x 15 reps each palloff press 2 x 25x to abdomen   Modalities: Ice pack applied to right knee throughout session x 15 min (unbilled time) for pain control; no adverse reaction noted   Patient was escorted to her car with supervision for safety at end of session   Patient response to treatment: improved soft tissue elasticity and decreased sensitivity in soft tissue by 50% following STM. Patient improved technique with exercises with minimal cuing and repetition.       PT Education - 11/13/17 1100    Education provided  Yes    Education Details  exercise instruction    Person(s) Educated  Patient    Methods  Explanation    Comprehension  Verbalized understanding          PT Long Term Goals - 10/23/17 1545      PT LONG TERM GOAL #1   Title  Patient will improve MODI score to 40% or better demonstrating improved function with daily tasks and able to stand, sit and walk with less difficulty    Baseline  MODI 55%; 45% 09/09/17; 10/23/17 40%    Status  Achieved      PT LONG TERM GOAL #2   Title  Patient will ben independent with home program for pain control and progressive exercises for flexibility and strength in lumbar spine/right LE  in order to transition to home program     Baseline  limited knowldge of appropriate exercises and progression without cuing, instruction     Status  Revised    Target Date  12/18/17      PT LONG TERM GOAL #3   Title  Patient will improve MODI score to 30% or better demonstrating improved function with daily tasks and able to stand, sit and walk with less difficulty    Baseline  MODI  55%; 40% 10/23/17    Status  Revised    Target Date  12/18/17            Plan - 11/13/17 1120    Clinical Impression Statement  Patient was limited in abiltiyt to exercise due to continued pain right knee. She demonstrated improved technique with exercises with minimal cuing. She continues with decresaed strength in core and UE/LEs  and pain in right knee and lower back that limit her function with daily tasks.     Rehab Potential  Fair    Clinical Impairments Affecting Rehab Potential  (+) motivated (-) muliple co morbidities, chronic condition    PT Frequency  2x / week    PT Duration  8 weeks    PT Treatment/Interventions  Electrical Stimulation;Moist Heat;Ultrasound;Patient/family education;Neuromuscular re-education;Therapeutic exercise;Manual techniques    PT Next Visit Plan  pain control, ther. ex, manual therapy    PT Home Exercise Plan  stabilization exercises; observe good body mechanics for daily tasks       Patient will benefit from skilled therapeutic intervention in order to improve the following deficits and impairments:  Decreased strength, Impaired flexibility, Decreased activity tolerance, Impaired perceived functional ability, Pain, Decreased endurance, Difficulty walking  Visit Diagnosis: Other muscle spasm  Chronic bilateral low back pain with sciatica, sciatica laterality unspecified  Muscle weakness (generalized)     Problem List Patient Active Problem List   Diagnosis Date Noted  . Assistance needed with transportation 05/26/2017  . Financial difficulties 05/26/2017  . Needs assistance with community resources 05/26/2017  . Moderate recurrent major depression (HCC) 05/26/2017  . Controlled substance  agreement broken 10/11/2016  . Breast cancer screening 06/18/2016  . Knee pain 04/03/2016  . Hyponatremia 03/01/2016  . High triglycerides 12/24/2015  . Pes planus of both feet 11/16/2015  . Plantar fasciitis of right foot 11/16/2015  . Heel spur 11/16/2015  . Diabetic neuropathy (HCC) 11/16/2015  . Right ankle pain 11/16/2015  . Medication monitoring encounter 11/16/2015  . Diabetes mellitus without complication (HCC) 08/15/2015  . Chronic pain of multiple joints 08/15/2015  . Abnormal mammogram of right breast 06/19/2015  . Cerumen impaction 03/16/2015  . Hypertension goal BP (blood pressure) < 140/90 12/14/2014  . Hyperlipidemia LDL goal <100 12/14/2014  . History of transfusion of packed RBC 12/14/2014  . COPD, mild (HCC) 12/14/2014  . Hx of smoking 12/14/2014  . Status post bariatric surgery 12/14/2014  . Morbid obesity (HCC) 12/14/2014  . Chronic radicular lumbar pain 12/14/2014  . History of GI bleed 11/12/2014  . GERD without esophagitis 11/12/2014  . History of small bowel obstruction 09/07/2014    Beacher May PT 11/14/2017, 10:03 AM  [place] [facility] PHYS[facility] SPORTS MEDICINE [address], [place], [address] Phone: [phone]   Fax:  [phone]  Name: Andrea Holden MRN: [id number] Date of Birth: 08-14-1945

## 2017-11-17 ENCOUNTER — Encounter: Payer: Self-pay | Admitting: Physical Therapy

## 2017-11-17 ENCOUNTER — Ambulatory Visit: Payer: Medicare HMO | Admitting: Physical Therapy

## 2017-11-17 DIAGNOSIS — M5440 Lumbago with sciatica, unspecified side: Secondary | ICD-10-CM

## 2017-11-17 DIAGNOSIS — G8929 Other chronic pain: Secondary | ICD-10-CM

## 2017-11-17 DIAGNOSIS — M6281 Muscle weakness (generalized): Secondary | ICD-10-CM

## 2017-11-17 DIAGNOSIS — M62838 Other muscle spasm: Secondary | ICD-10-CM | POA: Diagnosis not present

## 2017-11-17 NOTE — Therapy (Signed)
[place] [facility] [address], [place], [address] Phone: [phone]   Fax:  [phone]  Physical Therapy Treatment  Patient Details  Name: Andrea Holden MRN: [id number] Date of Birth: 08-24-1945 Referring Provider: Marene Lenz DO   Encounter Date: 11/17/2017  PT End of Session - 11/17/17 1114    Visit Number  29    Number of Visits  38    Date for PT Re-Evaluation  12/18/17    Authorization Type   9 of 10 (progress)    PT Start Time  1110    PT Stop Time  1145    PT Time Calculation (min)  35 min    Activity Tolerance  Patient tolerated treatment well    Behavior During Therapy  [facility] for tasks assessed/performed       Past Medical History:  Diagnosis Date  . Allergy    seasonal allergies  . Anemia   . Anxiety   . Arthritis   . Asthma    allergy induced asthma  . Cataract   . Clotting disorder (HCC)    history of blood clots  . Complication of anesthesia    arrhythmia following colonoscopy  . Degenerative disc disease, lumbar   . Degenerative disc disease, lumbar   . Depression   . Diabetes mellitus   . Diabetic neuropathy (HCC) 11/16/2015  . Dysrhythmia   . GERD (gastroesophageal reflux disease)   . H/O eating disorder   . H/O transfusion    patient was given 5 units of blood while at [name], blood type O+  . History of chicken pox   . History of hiatal hernia   . History of measles, mumps, or rubella   . HOH (hard of hearing)   . Hyperlipidemia   . Hypertension   . Irregular heartbeat   . Moderate COPD (chronic obstructive pulmonary disease) (HCC) 12/14/2014  . Neuropathy   . Opiate use 11/16/2015  . Orthopnea   . Pes planus of both feet 11/16/2015  . Plantar fasciitis of right foot 11/16/2015  . Wheezing     Past Surgical History:  Procedure Laterality Date  . ABDOMINAL HYSTERECTOMY    . CATARACT EXTRACTION W/PHACO Right 05/30/2015   Procedure: CATARACT EXTRACTION PHACO  AND INTRAOCULAR LENS PLACEMENT (IOC);  Surgeon: Galen Manila, MD;  Location: [facility] ORS;  Service: Ophthalmology;  Laterality: Right;  [place] 00:57 AP% 20.9 CDE 11.99 fluid pack lot #[id number]  . CHOLECYSTECTOMY    . COLON SURGERY     blocked colon  . FOOT FUSION Right 2018  . GALLBLADDER SURGERY    . GASTRIC BYPASS  2010  . internal bleeding  2016  . MOUTH SURGERY    . OVARY SURGERY    . SKIN GRAFT Right 2018   RT foot  . TENOTOMY ACHILLES TENDON     Percuntaneous  . TONSILLECTOMY      There were no vitals filed for this visit.  Subjective Assessment - 11/17/17 1113    Subjective  Patient reports she is still having pain in lower back and right knee.     Pertinent History  chronic back pain and foot pain over many years since 1995 with previous physical therapy with good results. Patient reports falling 06/02/17 which caused increased back pain and she is now waing to have evaluation by spine specialist    Limitations  Sitting;Standing;Walking;House hold activities    How long can you sit comfortably?  <1 hour  How long can you stand comfortably?  <20 min    How long can you walk comfortably?  only short distances    Patient Stated Goals  Patient wants to be abe to walk more normally with less pain in back to return to normal activities     Currently in Pain?  Yes    Pain Score  5     Pain Location  Back    Pain Descriptors / Indicators  Aching;Sore    Pain Type  Chronic pain    Pain Onset  More than a month ago    Pain Frequency  Intermittent       Objective:  Gait: ambulating independently with cane unsteady gait, antalgic pattern with decreased trunk rotation, and increased lateral trunk lean to right with stance with improved pattern from previous session Palpation: spasms lower lumbar spine, upper thoracic, cervical spine and upper trapezius muscles bilaterally   Treatment: Manual Therapy: 15 min. Goal: spasm, pain STM performed to back thoracic to lumbar region with  concentration on bilateral lower lumbar region superficial techniques seated in massage chair: patient transfers on and off massage chair with close supervision and assistance of therapist to stabilize chair    Therapeutic exercise: patient performed with demonstration, verbal cues and assistance of therapist: goal: strenth, improved endurance, function     Bilateral rows with doubled blue resistive band  x 15 Straight arm pull downs with blue resistive band  2 x 15 Single arm rows doubled blue resistive band 2  x 15 reps each palloff press 2 x 20 - 25x to abdomen  hip abduction with blue resistive band x 20 knee flexion x 15 reps each LE    Patient was escorted to her car with supervision for safety at end of session   Patient response to treatment: improved soft tissue elasticity and decreased pain 30% following treatment. Patient required minimal cuing for exercises with improved technique noted.     PT Education - 11/17/17 1140    Education provided  Yes    Education Details  exercise instruction for technique    Person(s) Educated  Patient    Methods  Explanation;Verbal cues    Comprehension  Verbalized understanding;Verbal cues required          PT Long Term Goals - 10/23/17 1545      PT LONG TERM GOAL #1   Title  Patient will improve MODI score to 40% or better demonstrating improved function with daily tasks and able to stand, sit and walk with less difficulty    Baseline  MODI 55%; 45% 09/09/17; 10/23/17 40%    Status  Achieved      PT LONG TERM GOAL #2   Title  Patient will ben independent with home program for pain control and progressive exercises for flexibility and strength in lumbar spine/right LE  in order to transition to home program     Baseline  limited knowldge of appropriate exercises and progression without cuing, instruction    Status  Revised    Target Date  12/18/17      PT LONG TERM GOAL #3   Title  Patient will improve MODI score to 30% or better  demonstrating improved function with daily tasks and able to stand, sit and walk with less difficulty    Baseline  MODI 55%; 40% 10/23/17    Status  Revised    Target Date  12/18/17            Plan -  11/17/17 1147    Clinical Impression Statement  limited session due to patient arriving 10 min late for appointment due to traffic. She required minimal cuing for correct technique for exercises and responded well to [facility] for cervical spine and lower lumbar spine. she is progressing slowly due to chronic pain, other conditions.      Rehab Potential  Fair    Clinical Impairments Affecting Rehab Potential  (+) motivated (-) muliple co morbidities, chronic condition    PT Frequency  2x / week    PT Duration  8 weeks    PT Treatment/Interventions  Electrical Stimulation;Moist Heat;Ultrasound;Patient/family education;Neuromuscular re-education;Therapeutic exercise;Manual techniques    PT Next Visit Plan  pain control, ther. ex, manual therapy    PT Home Exercise Plan  stabilization exercises; observe good body mechanics for daily tasks       Patient will benefit from skilled therapeutic intervention in order to improve the following deficits and impairments:  Decreased strength, Impaired flexibility, Decreased activity tolerance, Impaired perceived functional ability, Pain, Decreased endurance, Difficulty walking  Visit Diagnosis: Other muscle spasm  Chronic bilateral low back pain with sciatica, sciatica laterality unspecified  Muscle weakness (generalized)     Problem List Patient Active Problem List   Diagnosis Date Noted  . Assistance needed with transportation 05/26/2017  . Financial difficulties 05/26/2017  . Needs assistance with community resources 05/26/2017  . Moderate recurrent major depression (HCC) 05/26/2017  . Controlled substance agreement broken 10/11/2016  . Breast cancer screening 06/18/2016  . Knee pain 04/03/2016  . Hyponatremia 03/01/2016  . High  triglycerides 12/24/2015  . Pes planus of both feet 11/16/2015  . Plantar fasciitis of right foot 11/16/2015  . Heel spur 11/16/2015  . Diabetic neuropathy (HCC) 11/16/2015  . Right ankle pain 11/16/2015  . Medication monitoring encounter 11/16/2015  . Diabetes mellitus without complication (HCC) 08/15/2015  . Chronic pain of multiple joints 08/15/2015  . Abnormal mammogram of right breast 06/19/2015  . Cerumen impaction 03/16/2015  . Hypertension goal BP (blood pressure) < 140/90 12/14/2014  . Hyperlipidemia LDL goal <100 12/14/2014  . History of transfusion of packed RBC 12/14/2014  . COPD, mild (HCC) 12/14/2014  . Hx of smoking 12/14/2014  . Status post bariatric surgery 12/14/2014  . Morbid obesity (HCC) 12/14/2014  . Chronic radicular lumbar pain 12/14/2014  . History of GI bleed 11/12/2014  . GERD without esophagitis 11/12/2014  . History of small bowel obstruction 09/07/2014    Beacher May PT 11/17/2017, 11:12 PM  [place] [facility] PHYSICAL AND SPORTS MEDICINE [address] [place], [address] Phone: [phone]   Fax:  [phone]  Name: EARNEST THALMAN MRN: [id number] Date of Birth: 06-13-1946

## 2017-11-18 ENCOUNTER — Encounter: Payer: PPO | Admitting: Physical Therapy

## 2017-11-19 ENCOUNTER — Ambulatory Visit: Payer: Medicare HMO | Admitting: Physical Therapy

## 2017-11-19 ENCOUNTER — Encounter: Payer: Self-pay | Admitting: Physical Therapy

## 2017-11-19 DIAGNOSIS — M62838 Other muscle spasm: Secondary | ICD-10-CM | POA: Diagnosis not present

## 2017-11-19 DIAGNOSIS — G8929 Other chronic pain: Secondary | ICD-10-CM

## 2017-11-19 DIAGNOSIS — M6281 Muscle weakness (generalized): Secondary | ICD-10-CM | POA: Diagnosis not present

## 2017-11-19 DIAGNOSIS — M5440 Lumbago with sciatica, unspecified side: Secondary | ICD-10-CM | POA: Diagnosis not present

## 2017-11-19 NOTE — Therapy (Signed)
[place] [facility] PHYSICAL AND SPORTS MEDICINE [address] [place], [address] Phone: [phone]   Fax:  [phone]  Physical Therapy Treatment: Physical Therapy Progress Note   Dates of reporting period  10/07/18 to 11/19/2017   Patient Details  Name: Andrea Holden MRN: [id number] Date of Birth: 03-11-1946 Referring Provider: Marene Lenz DO   Encounter Date: 11/19/2017  PT End of Session - 11/19/17 1150    Visit Number  30    Number of Visits  38    Date for PT Re-Evaluation  12/18/17    Authorization Type   10 of 10 (progress)    PT Start Time  1116    PT Stop Time  1147    PT Time Calculation (min)  31 min    Activity Tolerance  Patient tolerated treatment well    Behavior During Therapy  [facility] for tasks assessed/performed       Past Medical History:  Diagnosis Date  . Allergy    seasonal allergies  . Anemia   . Anxiety   . Arthritis   . Asthma    allergy induced asthma  . Cataract   . Clotting disorder (HCC)    history of blood clots  . Complication of anesthesia    arrhythmia following colonoscopy  . Degenerative disc disease, lumbar   . Degenerative disc disease, lumbar   . Depression   . Diabetes mellitus   . Diabetic neuropathy (HCC) 11/16/2015  . Dysrhythmia   . GERD (gastroesophageal reflux disease)   . H/O eating disorder   . H/O transfusion    patient was given 5 units of blood while at [facility], blood type O+  . History of chicken pox   . History of hiatal hernia   . History of measles, mumps, or rubella   . HOH (hard of hearing)   . Hyperlipidemia   . Hypertension   . Irregular heartbeat   . Moderate COPD (chronic obstructive pulmonary disease) (HCC) 12/14/2014  . Neuropathy   . Opiate use 11/16/2015  . Orthopnea   . Pes planus of both feet 11/16/2015  . Plantar fasciitis of right foot 11/16/2015  . Wheezing     Past Surgical History:  Procedure Laterality Date  . ABDOMINAL HYSTERECTOMY    .  CATARACT EXTRACTION W/PHACO Right 05/30/2015   Procedure: CATARACT EXTRACTION PHACO AND INTRAOCULAR LENS PLACEMENT (IOC);  Surgeon: Galen Manila, MD;  Location: [facility] ORS;  Service: Ophthalmology;  Laterality: Right;  [place] 00:57 AP% 20.9 CDE 11.99 fluid pack lot #[id number]  . CHOLECYSTECTOMY    . COLON SURGERY     blocked colon  . FOOT FUSION Right 2018  . GALLBLADDER SURGERY    . GASTRIC BYPASS  2010  . internal bleeding  2016  . MOUTH SURGERY    . OVARY SURGERY    . SKIN GRAFT Right 2018   RT foot  . TENOTOMY ACHILLES TENDON     Percuntaneous  . TONSILLECTOMY      There were no vitals filed for this visit.  Subjective Assessment - 11/19/17 1116    Subjective  Patient reports stiffness, pain in lower back, right knee feeling better. She is running 15 min. late due to traffic again.    Pertinent History  chronic back pain and foot pain over many years since 1995 with previous physical therapy with good results. Patient reports falling 06/02/17 which caused increased back pain and she is now waing to have evaluation by  spine specialist    Limitations  Sitting;Standing;Walking;House hold activities    How long can you sit comfortably?  <1 hour    How long can you stand comfortably?  <20 min    How long can you walk comfortably?  only short distances    Patient Stated Goals  Patient wants to be abe to walk more normally with less pain in back to return to normal activities     Currently in Pain?  Yes    Pain Score  4     Pain Location  Back    Pain Orientation  Right;Posterior    Pain Descriptors / Indicators  Aching;Sore    Pain Type  Chronic pain    Pain Onset  More than a month ago    Pain Frequency  Intermittent       Objective:  Gait: ambulating independently with cane, slow cadence, antalgic patternwith decreased trunk rotation, and increased lateral trunk lean to right and right LE adducted with swing through: improved pattern from previous session Palpation: spasms  lower lumbar spine, upper thoracic, cervical spine and upper trapezius muscles bilaterally Outcome measure: to be assessed next session  Treatment: Manual Therapy:50min. Goal: spasm, pain STM performed to backthoracic to lumbar region withconcentrationonbilateral lower lumbar regionsuperficial techniques seated in massage chair: patient transfers on and off massage chair with close supervision and assistance of therapist to stabilize chair   Therapeutic exercise: patient performed with demonstration, verbal cues and assistance of therapist: goal: strenth, improved endurance, function  Bilateral rows with blueresistive band x 15 Straight arm pull downs withblueresistive band2 x 15 Single arm rows doubled blue resistive bandx 15repseach palloff press  25x to abdomen  Hip adduction with ball and glute sets x 15 reps Knee extension with 2# ankle weights tap balance stones in front x 15 reps  Patient was escorted to her car with supervision for safety at end of session  Patient response to treatment: Patient demonstrated improved technique and endurance with exercises with minimal cuing. Limited exercises due to patient arriving 15 min late to session. Improved soft tissue elasticity by 30% following STM.     PT Education - 11/19/17 1146    Education provided  Yes    Education Details  exercise instruction, re assess home program    Person(s) Educated  Patient    Methods  Explanation;Verbal cues    Comprehension  Verbalized understanding;Verbal cues required          PT Long Term Goals - 11/19/17 1200      PT LONG TERM GOAL #1   Title  Patient will improve MODI score to 40% or better demonstrating improved function with daily tasks and able to stand, sit and walk with less difficulty    Baseline  MODI 55%; 45% 09/09/17; 10/23/17 40%    Status  Achieved      PT LONG TERM GOAL #2   Title  Patient will ben independent with home program for pain control and  progressive exercises for flexibility and strength in lumbar spine/right LE  in order to transition to home program     Baseline  limited knowldge of appropriate exercises and progression without cuing, instruction    Status  On-going    Target Date  12/18/17      PT LONG TERM GOAL #3   Title  Patient will improve MODI score to 30% or better demonstrating improved function with daily tasks and able to stand, sit and walk with less difficulty  Baseline  MODI 55%; 40% 10/23/17    Status  On-going    Target Date  12/18/17            Plan - 11/19/17 1147    Clinical Impression Statement  Limited session due to patient arriving 15 min late for appointment. She is progressing steadily with decreasing symptoms in lower back and has not been able to exercise as she would like in frequency or intensity due to healing right knee injury from last week. She will require continued physical therapy intervention to continue to progress with goals for improved function with daily tasks.     Rehab Potential  Fair    Clinical Impairments Affecting Rehab Potential  (+) motivated (-) muliple co morbidities, chronic condition    PT Frequency  2x / week    PT Duration  8 weeks    PT Treatment/Interventions  Electrical Stimulation;Moist Heat;Ultrasound;Patient/family education;Neuromuscular re-education;Therapeutic exercise;Manual techniques    PT Next Visit Plan  pain control, ther. ex, manual therapy    PT Home Exercise Plan  stabilization exercises; observe good body mechanics for daily tasks       Patient will benefit from skilled therapeutic intervention in order to improve the following deficits and impairments:  Decreased strength, Impaired flexibility, Decreased activity tolerance, Impaired perceived functional ability, Pain, Decreased endurance, Difficulty walking  Visit Diagnosis: Other muscle spasm  Chronic bilateral low back pain with sciatica, sciatica laterality unspecified  Muscle  weakness (generalized)     Problem List Patient Active Problem List   Diagnosis Date Noted  . Assistance needed with transportation 05/26/2017  . Financial difficulties 05/26/2017  . Needs assistance with community resources 05/26/2017  . Moderate recurrent major depression (HCC) 05/26/2017  . Controlled substance agreement broken 10/11/2016  . Breast cancer screening 06/18/2016  . Knee pain 04/03/2016  . Hyponatremia 03/01/2016  . High triglycerides 12/24/2015  . Pes planus of both feet 11/16/2015  . Plantar fasciitis of right foot 11/16/2015  . Heel spur 11/16/2015  . Diabetic neuropathy (HCC) 11/16/2015  . Right ankle pain 11/16/2015  . Medication monitoring encounter 11/16/2015  . Diabetes mellitus without complication (HCC) 08/15/2015  . Chronic pain of multiple joints 08/15/2015  . Abnormal mammogram of right breast 06/19/2015  . Cerumen impaction 03/16/2015  . Hypertension goal BP (blood pressure) < 140/90 12/14/2014  . Hyperlipidemia LDL goal <100 12/14/2014  . History of transfusion of packed RBC 12/14/2014  . COPD, mild (HCC) 12/14/2014  . Hx of smoking 12/14/2014  . Status post bariatric surgery 12/14/2014  . Morbid obesity (HCC) 12/14/2014  . Chronic radicular lumbar pain 12/14/2014  . History of GI bleed 11/12/2014  . GERD without esophagitis 11/12/2014  . History of small bowel obstruction 09/07/2014    Beacher May PT 11/19/2017, 10:09 PM  [place] [facility] PHYSICAL AND SPORTS MEDICINE [address], [place], [address] Phone: [phone]   Fax:  [phone]  Name: Andrea Holden MRN: [id number] Date of Birth: 12-27-1945

## 2017-11-20 ENCOUNTER — Encounter: Payer: Self-pay | Admitting: *Deleted

## 2017-11-20 ENCOUNTER — Encounter: Payer: PPO | Admitting: Physical Therapy

## 2017-11-25 ENCOUNTER — Encounter: Payer: Self-pay | Admitting: Physical Therapy

## 2017-11-25 ENCOUNTER — Ambulatory Visit: Payer: Medicare HMO | Admitting: Physical Therapy

## 2017-11-25 DIAGNOSIS — M5440 Lumbago with sciatica, unspecified side: Secondary | ICD-10-CM | POA: Diagnosis not present

## 2017-11-25 DIAGNOSIS — G8929 Other chronic pain: Secondary | ICD-10-CM

## 2017-11-25 DIAGNOSIS — M62838 Other muscle spasm: Secondary | ICD-10-CM | POA: Diagnosis not present

## 2017-11-25 DIAGNOSIS — M6281 Muscle weakness (generalized): Secondary | ICD-10-CM

## 2017-11-25 NOTE — Therapy (Signed)
[place] [facility] PHYSICAL [facility] SPORTS MEDICINE [address], [place], [address] Phone: [phone]   Fax:  [phone]  Physical Therapy Treatment  Patient Details  Name: Andrea Holden MRN: [id number] Date of Birth: [birth year] Referring Provider: Marene Lenz DO   Encounter Date: 11/25/2017  PT End of Session - 11/25/17 1009    Visit Number  31    Number of Visits  38    Date for PT Re-Evaluation  12/18/17    Authorization Type   1 of 10 (progress)    PT Start Time  1000    PT Stop Time  1032    PT Time Calculation (min)  32 min    Activity Tolerance  Patient tolerated treatment well    Behavior During Therapy  [facility] for tasks assessed/performed       Past Medical History:  Diagnosis Date  . Allergy    seasonal allergies  . Anemia   . Anxiety   . Arthritis   . Asthma    allergy induced asthma  . Cataract   . Clotting disorder (HCC)    history of blood clots  . Complication of anesthesia    arrhythmia following colonoscopy  . Degenerative disc disease, lumbar   . Degenerative disc disease, lumbar   . Depression   . Diabetes mellitus   . Diabetic neuropathy (HCC) 11/16/2015  . Dysrhythmia   . GERD (gastroesophageal reflux disease)   . H/O eating disorder   . H/O transfusion    patient was given 5 units of blood while at [facility], blood type O+  . History of chicken pox   . History of hiatal hernia   . History of measles, mumps, or rubella   . HOH (hard of hearing)   . Hyperlipidemia   . Hypertension   . Irregular heartbeat   . Moderate COPD (chronic obstructive pulmonary disease) (HCC) 12/14/2014  . Neuropathy   . Opiate use 11/16/2015  . Orthopnea   . Pes planus of both feet 11/16/2015  . Plantar fasciitis of right foot 11/16/2015  . Wheezing     Past Surgical History:  Procedure Laterality Date  . ABDOMINAL HYSTERECTOMY    . CATARACT EXTRACTION W/PHACO Right 05/30/2015   Procedure: CATARACT EXTRACTION PHACO  AND INTRAOCULAR LENS PLACEMENT (IOC);  Surgeon: Galen Manila, MD;  Location: [facility] ORS;  Service: Ophthalmology;  Laterality: Right;  [place] 00:57 AP% 20.9 CDE 11.99 fluid pack lot #[id number]  . CHOLECYSTECTOMY    . COLON SURGERY     blocked colon  . FOOT FUSION Right 2018  . GALLBLADDER SURGERY    . GASTRIC BYPASS  2010  . internal bleeding  2016  . MOUTH SURGERY    . OVARY SURGERY    . SKIN GRAFT Right 2018   RT foot  . TENOTOMY ACHILLES TENDON     Percuntaneous  . TONSILLECTOMY      There were no vitals filed for this visit.  Subjective Assessment - 11/25/17 1003    Subjective  Patient reports her right ankle is having issues with area of surgery, more puffy and swollen with bleeding over the weekend over skin flap. she is self managing with antibiotic ointment and bandages.     Pertinent History  chronic back pain and foot pain over many years since 1995 with previous physical therapy with good results. Patient reports falling 06/02/17 which caused increased back pain and she is now waing to have evaluation by spine specialist  Limitations  Sitting;Standing;Walking;House hold activities    How long can you sit comfortably?  <1 hour    How long can you stand comfortably?  <20 min    How long can you walk comfortably?  only short distances    Patient Stated Goals  Patient wants to be abe to walk more normally with less pain in back to return to normal activities     Currently in Pain?  Yes    Pain Score  3     Pain Location  Back    Pain Orientation  Right;Posterior    Pain Descriptors / Indicators  Aching;Sore    Pain Type  Chronic pain    Pain Onset  More than a month ago    Pain Frequency  Intermittent       Objective:  Gait: ambulating independently with cane, slow cadence, antalgic patternwith decreased trunk rotation, and increased lateral trunk lean to right and right LE adducted with swing through: improved pattern from previous session Palpation: spasmslower  lumbar spine, upper thoracic, cervical spine and upper trapezius muscles bilaterally  Treatment: Manual Therapy:56min. Goal: spasm, pain STM performed to backthoracic to lumbar region withconcentrationonbilateral lower lumbar region and bilateral upper trapezius muscles, superficial techniques seated in massage chair: patient transfers on and off massage chair with close supervision and assistance of therapist to stabilize chair   Therapeutic exercise: patient performed with demonstration, verbal cues and assistance of therapist: goal: strenth, improved endurance, function  Bilateral rows with blueresistive band x 15 Straight arm pull downs withblueresistive band x 15 Single arm rows doubled blue resistive bandx 15repseach palloff press  25x to abdomen  Hip adduction with ball and glute sets x 15 reps Knee flexion blue resistive band x 15 reps each LE  Patient was escorted to her car with supervision for safety at end of session  Patient response to treatment: Patient demonstrated improved soft tissue elasticity with STM by 30% with reported decreased tenderness. She required minimal verbal cuing and demonstration to perform exercises with correct technique.       PT Education - 11/25/17 1008    Education provided  Yes    Education Details  exercise instruction    Person(s) Educated  Patient    Methods  Explanation;Verbal cues    Comprehension  Verbalized understanding;Verbal cues required          PT Long Term Goals - 11/19/17 1200      PT LONG TERM GOAL #1   Title  Patient will improve MODI score to 40% or better demonstrating improved function with daily tasks and able to stand, sit and walk with less difficulty    Baseline  MODI 55%; 45% 09/09/17; 10/23/17 40%    Status  Achieved      PT LONG TERM GOAL #2   Title  Patient will ben independent with home program for pain control and progressive exercises for flexibility and strength in lumbar  spine/right LE  in order to transition to home program     Baseline  limited knowldge of appropriate exercises and progression without cuing, instruction    Status  On-going    Target Date  12/18/17      PT LONG TERM GOAL #3   Title  Patient will improve MODI score to 30% or better demonstrating improved function with daily tasks and able to stand, sit and walk with less difficulty    Baseline  MODI 55%; 40% 10/23/17    Status  On-going  Target Date  12/18/17            Plan - 11/25/17 1031    Clinical Impression Statement  Limited session due to patient arriving late to therapy session; reported traffic as cause. She is progressing steadily with goals with decreasing symptoms in back and able ot perform exercises with minimal cuing.     Rehab Potential  Fair    Clinical Impairments Affecting Rehab Potential  (+) motivated (-) muliple co morbidities, chronic condition    PT Frequency  2x / week    PT Duration  8 weeks    PT Treatment/Interventions  Electrical Stimulation;Moist Heat;Ultrasound;Patient/family education;Neuromuscular re-education;Therapeutic exercise;Manual techniques    PT Next Visit Plan  pain control, ther. ex, manual therapy    PT Home Exercise Plan  stabilization exercises; observe good body mechanics for daily tasks       Patient will benefit from skilled therapeutic intervention in order to improve the following deficits and impairments:  Decreased strength, Impaired flexibility, Decreased activity tolerance, Impaired perceived functional ability, Pain, Decreased endurance, Difficulty walking  Visit Diagnosis: Other muscle spasm  Chronic bilateral low back pain with sciatica, sciatica laterality unspecified  Muscle weakness (generalized)     Problem List Patient Active Problem List   Diagnosis Date Noted  . Assistance needed with transportation 05/26/2017  . Financial difficulties 05/26/2017  . Needs assistance with community resources 05/26/2017   . Moderate recurrent major depression (HCC) 05/26/2017  . Controlled substance agreement broken 10/11/2016  . Breast cancer screening 06/18/2016  . Knee pain 04/03/2016  . Hyponatremia 03/01/2016  . High triglycerides 12/24/2015  . Pes planus of both feet 11/16/2015  . Plantar fasciitis of right foot 11/16/2015  . Heel spur 11/16/2015  . Diabetic neuropathy (HCC) 11/16/2015  . Right ankle pain 11/16/2015  . Medication monitoring encounter 11/16/2015  . Diabetes mellitus without complication (HCC) 08/15/2015  . Chronic pain of multiple joints 08/15/2015  . Abnormal mammogram of right breast 06/19/2015  . Cerumen impaction 03/16/2015  . Hypertension goal BP (blood pressure) < 140/90 12/14/2014  . Hyperlipidemia LDL goal <100 12/14/2014  . History of transfusion of packed RBC 12/14/2014  . COPD, mild (HCC) 12/14/2014  . Hx of smoking 12/14/2014  . Status post bariatric surgery 12/14/2014  . Morbid obesity (HCC) 12/14/2014  . Chronic radicular lumbar pain 12/14/2014  . History of GI bleed 11/12/2014  . GERD without esophagitis 11/12/2014  . History of small bowel obstruction 09/07/2014    Beacher May PT 11/25/2017, 3:43 PM  [facility] PHYSICAL AND SPORTS MEDICINE [address], [place], [address] Phone: [phone]   Fax:  [phone]  Name: Andrea Holden MRN: [id number] Date of Birth: 1945/10/30

## 2017-11-27 ENCOUNTER — Encounter: Payer: Self-pay | Admitting: Physical Therapy

## 2017-11-27 ENCOUNTER — Ambulatory Visit: Payer: Medicare HMO | Admitting: Physical Therapy

## 2017-11-27 DIAGNOSIS — M62838 Other muscle spasm: Secondary | ICD-10-CM | POA: Diagnosis not present

## 2017-11-27 DIAGNOSIS — M6281 Muscle weakness (generalized): Secondary | ICD-10-CM | POA: Diagnosis not present

## 2017-11-27 DIAGNOSIS — M5440 Lumbago with sciatica, unspecified side: Secondary | ICD-10-CM

## 2017-11-27 DIAGNOSIS — G8929 Other chronic pain: Secondary | ICD-10-CM | POA: Diagnosis not present

## 2017-11-28 NOTE — Therapy (Signed)
[facility] [address], [place], [address] Phone: [phone]   Fax:  [phone]  Physical Therapy Treatment  Patient Details  Name: Andrea Holden MRN: [id number] Date of Birth: 1946-03-25 Referring Provider: Marene Lenz DO   Encounter Date: 11/27/2017  PT End of Session - 11/27/17 1051    Visit Number  32    Number of Visits  38    Date for PT Re-Evaluation  12/18/17    Authorization Type   2 of 10 (progress)    PT Start Time  1048    PT Stop Time  1123    PT Time Calculation (min)  35 min    Activity Tolerance  Patient tolerated treatment well    Behavior During Therapy  [facility] for tasks assessed/performed       Past Medical History:  Diagnosis Date  . Allergy    seasonal allergies  . Anemia   . Anxiety   . Arthritis   . Asthma    allergy induced asthma  . Cataract   . Clotting disorder (HCC)    history of blood clots  . Complication of anesthesia    arrhythmia following colonoscopy  . Degenerative disc disease, lumbar   . Degenerative disc disease, lumbar   . Depression   . Diabetes mellitus   . Diabetic neuropathy (HCC) 11/16/2015  . Dysrhythmia   . GERD (gastroesophageal reflux disease)   . H/O eating disorder   . H/O transfusion    patient was given 5 units of blood while at [facility], blood type O+  . History of chicken pox   . History of hiatal hernia   . History of measles, mumps, or rubella   . HOH (hard of hearing)   . Hyperlipidemia   . Hypertension   . Irregular heartbeat   . Moderate COPD (chronic obstructive pulmonary disease) (HCC) 12/14/2014  . Neuropathy   . Opiate use 11/16/2015  . Orthopnea   . Pes planus of both feet 11/16/2015  . Plantar fasciitis of right foot 11/16/2015  . Wheezing     Past Surgical History:  Procedure Laterality Date  . ABDOMINAL HYSTERECTOMY    . CATARACT EXTRACTION W/PHACO Right 05/30/2015   Procedure: CATARACT EXTRACTION PHACO  AND INTRAOCULAR LENS PLACEMENT (IOC);  Surgeon: Galen Manila, MD;  Location: [facility] ORS;  Service: Ophthalmology;  Laterality: Right;  [place] 00:57 AP% 20.9 CDE 11.99 fluid pack lot #[id number]  . CHOLECYSTECTOMY    . COLON SURGERY     blocked colon  . FOOT FUSION Right 2018  . GALLBLADDER SURGERY    . GASTRIC BYPASS  2010  . internal bleeding  2016  . MOUTH SURGERY    . OVARY SURGERY    . SKIN GRAFT Right 2018   RT foot  . TENOTOMY ACHILLES TENDON     Percuntaneous  . TONSILLECTOMY      There were no vitals filed for this visit.  Subjective Assessment - 11/27/17 1049    Subjective  Patient reports upper back tightness across shoulder blades and even with taking a breathe today. she has been busy and stressed lately.    Pertinent History  chronic back pain and foot pain over many years since 1995 with previous physical therapy with good results. Patient reports falling 06/02/17 which caused increased back pain and she is now waing to have evaluation by spine specialist    Limitations  Sitting;Standing;Walking;House hold activities    How  long can you sit comfortably?  <1 hour    How long can you stand comfortably?  <20 min    How long can you walk comfortably?  only short distances    Patient Stated Goals  Patient wants to be abe to walk more normally with less pain in back to return to normal activities     Currently in Pain?  Yes    Pain Score  6     Pain Location  Back    Pain Orientation  Upper;Mid;Lower    Pain Descriptors / Indicators  Aching;Tightness    Pain Type  Chronic pain    Pain Onset  More than a month ago    Pain Frequency  Intermittent        Objective:  Gait: slow cadence, antagic gait pattern Palpation: spasms and hypersensitive upper thoracic, cervical spine and upper trapezius muscles bilaterally   Treatment: Manual Therapy: 15 min. Goal: spasm, pain STM performed to back thoracic to lumbar region with concentration on bilateral lower lumbar region and  bilateral upper trapezius muscles, superficial techniques seated in massage chair: patient transfers on and off massage chair with close supervision and assistance of therapist to stabilize chair    Therapeutic exercise: patient performed with demonstration, verbal cues and assistance of therapist: goal: strenth, improved endurance, function     Bilateral rows with blue resistive band  x 15 Straight arm pull downs with blue resistive band   x 15 Single arm rows doubled blue resistive band x 15 reps each palloff press  25x to abdomen   Patient was escorted to her car with supervision for safety at end of session   Patient response to treatment: improved soft tissue with decreased tenerness 25% with treatment. minimal verbal cuing for correct technique     PT Education - 11/27/17 1100    Education provided  Yes    Education Details  exercise instruction for stress control and strengthening to assist with pain control in upper back and allow her to perform more activities with imrpoved endurance    Person(s) Educated  Patient    Methods  Explanation;Demonstration;Verbal cues    Comprehension  Verbalized understanding;Returned demonstration;Verbal cues required          PT Long Term Goals - 11/19/17 1200      PT LONG TERM GOAL #1   Title  Patient will improve MODI score to 40% or better demonstrating improved function with daily tasks and able to stand, sit and walk with less difficulty    Baseline  MODI 55%; 45% 09/09/17; 10/23/17 40%    Status  Achieved      PT LONG TERM GOAL #2   Title  Patient will ben independent with home program for pain control and progressive exercises for flexibility and strength in lumbar spine/right LE  in order to transition to home program     Baseline  limited knowldge of appropriate exercises and progression without cuing, instruction    Status  On-going    Target Date  12/18/17      PT LONG TERM GOAL #3   Title  Patient will improve MODI score to  30% or better demonstrating improved function with daily tasks and able to stand, sit and walk with less difficulty    Baseline  MODI 55%; 40% 10/23/17    Status  On-going    Target Date  12/18/17            Plan - 11/27/17 1117  Clinical Impression Statement  Patient arrived late therefore limited exercises to UE's today. She continues to demonstrate steady progress towards goals with improving pain, spasms and strength with current treatment.     Rehab Potential  Fair    Clinical Impairments Affecting Rehab Potential  (+) motivated (-) muliple co morbidities, chronic condition    PT Frequency  2x / week    PT Duration  8 weeks    PT Treatment/Interventions  Electrical Stimulation;Moist Heat;Ultrasound;Patient/family education;Neuromuscular re-education;Therapeutic exercise;Manual techniques    PT Next Visit Plan  pain control, ther. ex, manual therapy    PT Home Exercise Plan  stabilization exercises; observe good body mechanics for daily tasks       Patient will benefit from skilled therapeutic intervention in order to improve the following deficits and impairments:  Decreased strength, Impaired flexibility, Decreased activity tolerance, Impaired perceived functional ability, Pain, Decreased endurance, Difficulty walking  Visit Diagnosis: Other muscle spasm  Chronic bilateral low back pain with sciatica, sciatica laterality unspecified  Muscle weakness (generalized)     Problem List Patient Active Problem List   Diagnosis Date Noted  . Assistance needed with transportation 05/26/2017  . Financial difficulties 05/26/2017  . Needs assistance with community resources 05/26/2017  . Moderate recurrent major depression (HCC) 05/26/2017  . Controlled substance agreement broken 10/11/2016  . Breast cancer screening 06/18/2016  . Knee pain 04/03/2016  . Hyponatremia 03/01/2016  . High triglycerides 12/24/2015  . Pes planus of both feet 11/16/2015  . Plantar fasciitis of  right foot 11/16/2015  . Heel spur 11/16/2015  . Diabetic neuropathy (HCC) 11/16/2015  . Right ankle pain 11/16/2015  . Medication monitoring encounter 11/16/2015  . Diabetes mellitus without complication (HCC) 08/15/2015  . Chronic pain of multiple joints 08/15/2015  . Abnormal mammogram of right breast 06/19/2015  . Cerumen impaction 03/16/2015  . Hypertension goal BP (blood pressure) < 140/90 12/14/2014  . Hyperlipidemia LDL goal <100 12/14/2014  . History of transfusion of packed RBC 12/14/2014  . COPD, mild (HCC) 12/14/2014  . Hx of smoking 12/14/2014  . Status post bariatric surgery 12/14/2014  . Morbid obesity (HCC) 12/14/2014  . Chronic radicular lumbar pain 12/14/2014  . History of GI bleed 11/12/2014  . GERD without esophagitis 11/12/2014  . History of small bowel obstruction 09/07/2014    Beacher May PT 11/28/2017, 9:16 AM  [facility] PH[facility] SPORTS MEDICINE [address] [place], [address] Phone: [phone]   Fax:  [phone]  Name: Andrea Holden MRN: [id number] Date of Birth: 1946-03-31

## 2017-12-01 ENCOUNTER — Telehealth: Payer: Self-pay | Admitting: Dietician

## 2017-12-01 ENCOUNTER — Encounter: Payer: Self-pay | Admitting: Physical Therapy

## 2017-12-01 ENCOUNTER — Ambulatory Visit: Payer: Medicare HMO | Attending: Physical Medicine and Rehabilitation | Admitting: Physical Therapy

## 2017-12-01 DIAGNOSIS — M5441 Lumbago with sciatica, right side: Secondary | ICD-10-CM | POA: Insufficient documentation

## 2017-12-01 DIAGNOSIS — M62838 Other muscle spasm: Secondary | ICD-10-CM

## 2017-12-01 DIAGNOSIS — M6281 Muscle weakness (generalized): Secondary | ICD-10-CM | POA: Diagnosis not present

## 2017-12-01 DIAGNOSIS — G8929 Other chronic pain: Secondary | ICD-10-CM | POA: Diagnosis not present

## 2017-12-01 DIAGNOSIS — M5440 Lumbago with sciatica, unspecified side: Secondary | ICD-10-CM | POA: Diagnosis not present

## 2017-12-01 NOTE — Telephone Encounter (Signed)
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 She would like to come at the end of June. Called patient back and left a voicemail message with available dates and times and requested a call back.

## 2017-12-01 NOTE — Therapy (Signed)
[facility] [address] [place], [address] Phone: [phone]   Fax:  [phone]  Physical Therapy Treatment  Patient Details  Name: Andrea Holden MRN: [id number] Date of Birth: Jul 12, 1945 Referring Provider: Marene Lenz DO   Encounter Date: 12/01/2017  PT End of Session - 12/01/17 1336    Visit Number  33    Number of Visits  38    Date for PT Re-Evaluation  12/18/17    Authorization Type   3 of 10 (progress)    PT Start Time  1330    PT Stop Time  1410    PT Time Calculation (min)  40 min    Activity Tolerance  Patient tolerated treatment well    Behavior During Therapy  [facility] for tasks assessed/performed       Past Medical History:  Diagnosis Date  . Allergy    seasonal allergies  . Anemia   . Anxiety   . Arthritis   . Asthma    allergy induced asthma  . Cataract   . Clotting disorder (HCC)    history of blood clots  . Complication of anesthesia    arrhythmia following colonoscopy  . Degenerative disc disease, lumbar   . Degenerative disc disease, lumbar   . Depression   . Diabetes mellitus   . Diabetic neuropathy (HCC) 11/16/2015  . Dysrhythmia   . GERD (gastroesophageal reflux disease)   . H/O eating disorder   . H/O transfusion    patient was given 5 units of blood while at [facility], blood type O+  . History of chicken pox   . History of hiatal hernia   . History of measles, mumps, or rubella   . HOH (hard of hearing)   . Hyperlipidemia   . Hypertension   . Irregular heartbeat   . Moderate COPD (chronic obstructive pulmonary disease) (HCC) 12/14/2014  . Neuropathy   . Opiate use 11/16/2015  . Orthopnea   . Pes planus of both feet 11/16/2015  . Plantar fasciitis of right foot 11/16/2015  . Wheezing     Past Surgical History:  Procedure Laterality Date  . ABDOMINAL HYSTERECTOMY    . CATARACT EXTRACTION W/PHACO Right 05/30/2015   Procedure: CATARACT EXTRACTION PHACO AND  INTRAOCULAR LENS PLACEMENT (IOC);  Surgeon: Galen Manila, MD;  Location: [facility]S;  Service: Ophthalmology;  Laterality: Right;  [place] 00:57 AP% 20.9 CDE 11.99 fluid pack lot #[id number]  . CHOLECYSTECTOMY    . COLON SURGERY     blocked colon  . FOOT FUSION Right 2018  . GALLBLADDER SURGERY    . GASTRIC BYPASS  2010  . internal bleeding  2016  . MOUTH SURGERY    . OVARY SURGERY    . SKIN GRAFT Right 2018   RT foot  . TENOTOMY ACHILLES TENDON     Percuntaneous  . TONSILLECTOMY      There were no vitals filed for this visit.  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     Pertinent History  chronic back pain and foot pain over many years since 1995 with previous physical therapy with good results. Patient reports falling 06/02/17 which caused increased back pain and she is now waing to have evaluation by spine specialist    Limitations  Sitting;Standing;Walking;House hold activities  How long can you sit comfortably?  <1 hour    How long can you stand comfortably?  <20 min    How long can you walk comfortably?  only short distances    Patient Stated Goals  Patient wants to be abe to walk more normally with less pain in back to return to normal activities     Currently in Pain?  Yes    Pain Score  6     Pain Location  Back    Pain Orientation  Upper;Mid;Lower    Pain Descriptors / Indicators  Aching;Tightness    Pain Type  Chronic pain    Pain Onset  More than a month ago    Pain Frequency  Intermittent        Objective:  Gait: slow cadence, antagic gait pattern Palpation: spasms and hypersensitive upper thoracic, cervical spine and upper trapezius muscles bilaterally   Treatment: Manual Therapy: 15 min. Goal: spasm, pain STM performed to back thoracic to lumbar region with concentration on bilateral lower lumbar region and bilateral  upper trapezius muscles, superficial techniques seated in massage chair: patient transfers on and off massage chair with close supervision and assistance of therapist to stabilize chair    Therapeutic exercise: patient performed with demonstration, verbal cues and assistance of therapist: goal: strenth, improved endurance, function     Bilateral rows with blue resistive band  x 15 Straight arm pull downs with blue resistive band   x 15 Single arm rows doubled blue resistive band x 15 reps each palloff press  25x to abdomen  LE's:  sitting knee flexion blue resistive band 2 x 15 sitting knee extension 3# weights on ankles tapping balance stones x 25 reps each hip abduction blueTB x 18   Patient was escorted to her car with supervision for safety at end of session   Patient response to treatment: improved technique with exercises with moderate cuing and assistance. decreased spasms in upper back and upper trapezius muscles by 50% with STM. Pain improved from 6/10 to 4/10 in back at end of session.       PT Education - 12/01/17 1426    Education provided  Yes    Education Details  exercise instruction for technique; re addressed stress control     Person(s) Educated  Patient    Methods  Explanation;Demonstration;Verbal cues    Comprehension  Verbalized understanding;Returned demonstration;Verbal cues required          PT Long Term Goals - 11/19/17 1200      PT LONG TERM GOAL #1   Title  Patient will improve MODI score to 40% or better demonstrating improved function with daily tasks and able to stand, sit and walk with less difficulty    Baseline  MODI 55%; 45% 09/09/17; 10/23/17 40%    Status  Achieved      PT LONG TERM GOAL #2   Title  Patient will ben independent with home program for pain control and progressive exercises for flexibility and strength in lumbar spine/right LE  in order to transition to home program     Baseline  limited knowldge of appropriate exercises and  progression without cuing, instruction    Status  On-going    Target Date  12/18/17      PT LONG TERM GOAL #3   Title  Patient will improve MODI score to 30% or better demonstrating improved function with daily tasks and able to stand, sit and walk with less difficulty  Baseline  MODI 55%; 40% 10/23/17    Status  On-going    Target Date  12/18/17            Plan - 12/01/17 1423    Clinical Impression Statement  Patient improved technique and endurance with repetitoin and moderate assistance and cuing. She continues with decreased endurance for prolonged standing and walking due to back and right foot pain, weakness. She will require continued physical therapy intervention to maximize function.     Rehab Potential  Fair    Clinical Impairments Affecting Rehab Potential  (+) motivated (-) muliple co morbidities, chronic condition    PT Frequency  2x / week    PT Duration  8 weeks    PT Treatment/Interventions  Electrical Stimulation;Moist Heat;Ultrasound;Patient/family education;Neuromuscular re-education;Therapeutic exercise;Manual techniques    PT Next Visit Plan  pain control, ther. ex, manual therapy    PT Home Exercise Plan  stabilization exercises; observe good body mechanics for daily tasks       Patient will benefit from skilled therapeutic intervention in order to improve the following deficits and impairments:  Decreased strength, Impaired flexibility, Decreased activity tolerance, Impaired perceived functional ability, Pain, Decreased endurance, Difficulty walking  Visit Diagnosis: Other muscle spasm  Chronic bilateral low back pain with sciatica, sciatica laterality unspecified  Muscle weakness (generalized)     Problem List Patient Active Problem List   Diagnosis Date Noted  . Assistance needed with transportation 05/26/2017  . Financial difficulties 05/26/2017  . Needs assistance with community resources 05/26/2017  . Moderate recurrent major depression  (HCC) 05/26/2017  . Controlled substance agreement broken 10/11/2016  . Breast cancer screening 06/18/2016  . Knee pain 04/03/2016  . Hyponatremia 03/01/2016  . High triglycerides 12/24/2015  . Pes planus of both feet 11/16/2015  . Plantar fasciitis of right foot 11/16/2015  . Heel spur 11/16/2015  . Diabetic neuropathy (HCC) 11/16/2015  . Right ankle pain 11/16/2015  . Medication monitoring encounter 11/16/2015  . Diabetes mellitus without complication (HCC) 08/15/2015  . Chronic pain of multiple joints 08/15/2015  . Abnormal mammogram of right breast 06/19/2015  . Cerumen impaction 03/16/2015  . Hypertension goal BP (blood pressure) < 140/90 12/14/2014  . Hyperlipidemia LDL goal <100 12/14/2014  . History of transfusion of packed RBC 12/14/2014  . COPD, mild (HCC) 12/14/2014  . Hx of smoking 12/14/2014  . Status post bariatric surgery 12/14/2014  . Morbid obesity (HCC) 12/14/2014  . Chronic radicular lumbar pain 12/14/2014  . History of GI bleed 11/12/2014  . GERD without esophagitis 11/12/2014  . History of small bowel obstruction 09/07/2014    Beacher MayBrooks, Zuriel Yeaman PT 12/01/2017, 2:27 PM  [facility] [address] [place], [place], [address] Phone: [phone]   Fax:  [phone]  Name: Andrea Holden MRN: [id number] Date of Birth: Nov 26, 1945

## 2017-12-03 ENCOUNTER — Ambulatory Visit: Payer: Medicare HMO | Admitting: Dietician

## 2017-12-04 ENCOUNTER — Other Ambulatory Visit: Payer: Self-pay

## 2017-12-04 ENCOUNTER — Ambulatory Visit: Payer: Medicare HMO | Admitting: Physical Therapy

## 2017-12-04 DIAGNOSIS — N760 Acute vaginitis: Secondary | ICD-10-CM | POA: Insufficient documentation

## 2017-12-04 DIAGNOSIS — K289 Gastrojejunal ulcer, unspecified as acute or chronic, without hemorrhage or perforation: Secondary | ICD-10-CM | POA: Insufficient documentation

## 2017-12-04 DIAGNOSIS — M62838 Other muscle spasm: Secondary | ICD-10-CM

## 2017-12-04 DIAGNOSIS — M6281 Muscle weakness (generalized): Secondary | ICD-10-CM | POA: Diagnosis not present

## 2017-12-04 DIAGNOSIS — M5441 Lumbago with sciatica, right side: Secondary | ICD-10-CM | POA: Diagnosis not present

## 2017-12-04 DIAGNOSIS — G8929 Other chronic pain: Secondary | ICD-10-CM

## 2017-12-04 DIAGNOSIS — M5440 Lumbago with sciatica, unspecified side: Secondary | ICD-10-CM | POA: Diagnosis not present

## 2017-12-04 DIAGNOSIS — Z1211 Encounter for screening for malignant neoplasm of colon: Secondary | ICD-10-CM

## 2017-12-04 NOTE — Therapy (Signed)
[place] [facility] PHYSICAL AND SPORTS MEDICINE [address] [place], [place], [address] Phone: [phone]   Fax:  [phone]  Physical Therapy Treatment  Patient Details  Name: Andrea MangleDianna J Holden MRN: [id number] Date of Birth: [birth year] Referring Provider: Marene LenzKalman, Arthur DO   Encounter Date: 12/04/2017  PT End of Session - 12/04/17 1910    Visit Number  34    Number of Visits  38    Date for PT Re-Evaluation  12/18/17    Authorization Type   4 of 10 (progress)    PT Start Time  1130    PT Stop Time  1213    PT Time Calculation (min)  43 min    Activity Tolerance  Patient tolerated treatment well    Behavior During Therapy  [facility]       Past Medical History:  Diagnosis Date  . Allergy    seasonal allergies  . Anemia   . Anxiety   . Arthritis   . Asthma    allergy induced asthma  . Cataract   . Clotting disorder (HCC)    history of blood clots  . Complication of anesthesia    arrhythmia following colonoscopy  . Degenerative disc disease, lumbar   . Degenerative disc disease, lumbar   . Depression   . Diabetes mellitus   . Diabetic neuropathy (HCC) 11/16/2015  . Dysrhythmia   . GERD (gastroesophageal reflux disease)   . H/O eating disorder   . H/O transfusion    [facility], [facility] type O+  . History of chicken pox   . History of hiatal hernia   . History of measles, mumps, or rubella   . HOH (hard of hearing)   . Hyperlipidemia   . Hypertension   . Irregular heartbeat   . Moderate COPD (chronic obstructive pulmonary disease) (HCC) 12/14/2014  . Neuropathy   . Opiate use 11/16/2015  . Orthopnea   . Pes planus of both feet 11/16/2015  . Plantar fasciitis of right foot 11/16/2015  . Wheezing     Past Surgical History:  Procedure Laterality Date  . ABDOMINAL HYSTERECTOMY    . CATARACT EXTRACTION W/PHACO Right 05/30/2015   Procedure: CATARACT EXTRACTION PHACO AND  INTRAOCULAR LENS PLACEMENT (IOC);  Surgeon: Galen ManilaWilliam Porfilio, MD;  Location: [facility] ORS;  Service: Ophthalmology;  Laterality: Right;  US 00:57 AP% 20.9 CDE 11.99 fluid pack lot #[id number]  . CHOLECYSTECTOMY    . COLON SURGERY     blocked colon  . FOOT FUSION Right 2018  . GALLBLADDER SURGERY    . GASTRIC BYPASS  2010  . internal bleeding  2016  . MOUTH SURGERY    . OVARY SURGERY    . SKIN GRAFT Right 2018   RT foot  . TENOTOMY ACHILLES TENDON     Percuntaneous  . TONSILLECTOMY      There were no vitals filed for this visit.  Subjective Assessment - 12/04/17 1130    Subjective  Patient reports she is noticing improvement with back pain and spasms and continues with intermittent pain in upper back today right side>left    Pertinent History  chronic back pain and foot pain over many years since 1995 with previous physical therapy with good results. Patient reports falling 06/02/17 which caused increased back pain and she is now waing to have evaluation by spine specialist    Limitations  Sitting;Standing;Walking;House hold activities    How  long can you sit comfortably?  <1 hour    How long can you stand comfortably?  <20 min    How long can you walk comfortably?  only short distances    Patient Stated Goals  Patient wants to be abe to walk more normally with less pain in back to return to normal activities     Currently in Pain?  Yes    Pain Score  5     Pain Location  Back    Pain Orientation  Right;Left;Upper;Mid;Lower    Pain Descriptors / Indicators  Aching;Spasm    Pain Type  Chronic pain    Pain Onset  More than a month ago    Pain Frequency  Intermittent           Objective:  Palpation: spasms and hypersensitive upper thoracic right side>left and upper trapezius muscles bilaterally; left lower lumbar spine paraspinal muscles   Treatment: Manual Therapy: 15 min. Goal: spasm, pain STM performed to back thoracic to lumbar region with concentration on thoracic region  right >left region and left lumbar region, superficial techniques seated in massage chair: patient transfers on and off massage chair with close supervision and assistance of therapist to stabilize chair    Therapeutic exercise: patient performed with demonstration, verbal cues and assistance of therapist: goal: strenth, improved endurance, function     Bilateral rows with blue resistive band  x 15 Straight arm pull downs with blue resistive band   x 15 Single arm rows doubled blue resistive band x 15 reps each palloff press  25x to abdomen   LE's:  sitting knee flexion blue resistive band 2 x 15 sitting knee extension 3# weights on ankles tapping balance stones 2 x 15 reps each   Patient was escorted to her car with supervision for safety at end of session   Patient response to treatment: improved soft tissue elasticity with decreased spasm 50% following STM. Improved motor control and technique with exercises with minimal cuing .        PT Education - 12/04/17 1910    Education provided  Yes    Education Details  exercise instruction with HEP for core stabilization/ strengthening in sitting    Person(s) Educated  Patient    Methods  Explanation;Demonstration;Verbal cues;Handout    Comprehension  Verbalized understanding;Returned demonstration;Verbal cues required          PT Long Term Goals - 11/19/17 1200      PT LONG TERM GOAL #1   Title  Patient will improve MODI score to 40% or better demonstrating improved function with daily tasks and able to stand, sit and walk with less difficulty    Baseline  MODI 55%; 45% 09/09/17; 10/23/17 40%    Status  Achieved      PT LONG TERM GOAL #2   Title  Patient will ben independent with home program for pain control and progressive exercises for flexibility and strength in lumbar spine/right LE  in order to transition to home program     Baseline  limited knowldge of appropriate exercises and progression without cuing, instruction     Status  On-going    Target Date  12/18/17      PT LONG TERM GOAL #3   Title  Patient will improve MODI score to 30% or better demonstrating improved function with daily tasks and able to stand, sit and walk with less difficulty    Baseline  MODI 55%; 40% 10/23/17    Status  On-going    Target Date  12/18/17            Plan - 12/04/17 1231    Clinical Impression Statement  Patient demonstrated improved pain, decreased spasms >50% with treatment and demonstrated good technique with exercises with minimal cuing. She continues with intermittent exacerbation of pain, spasms with activity and will benefit from additional physical therapy intervention to achieve maximal gains and function for daily activities.     Rehab Potential  Fair    Clinical Impairments Affecting Rehab Potential  (+) motivated (-) muliple co morbidities, chronic condition    PT Frequency  2x / week    PT Duration  8 weeks    PT Treatment/Interventions  Electrical Stimulation;Moist Heat;Ultrasound;Patient/family education;Neuromuscular re-education;Therapeutic exercise;Manual techniques    PT Next Visit Plan  pain control, ther. ex, manual therapy    PT Home Exercise Plan  stabilization exercises; observe good body mechanics for daily tasks       Patient will benefit from skilled therapeutic intervention in order to improve the following deficits and impairments:  Decreased strength, Impaired flexibility, Decreased activity tolerance, Impaired perceived functional ability, Pain, Decreased endurance, Difficulty walking  Visit Diagnosis: Other muscle spasm  Chronic bilateral low back pain with right-sided sciatica  Muscle weakness (generalized)     Problem List Patient Active Problem List   Diagnosis Date Noted  . Gastrojejunal ulcer 12/04/2017  . Hyperphosphatemia 12/04/2017  . Vaginitis 12/04/2017  . Spinal stenosis of lumbar region 06/18/2017  . Degeneration of lumbar intervertebral disc 06/02/2017  .  Assistance needed with transportation 05/26/2017  . Financial difficulties 05/26/2017  . Needs assistance with community resources 05/26/2017  . Moderate recurrent major depression (HCC) 05/26/2017  . Non-healing wound of lower extremity 02/06/2017  . Status post ankle arthrodesis 12/11/2016  . Controlled substance agreement broken 10/11/2016  . Low back pain 09/25/2016  . Osteoarthritis of right subtalar joint 07/11/2016  . Posterior tibial tendinitis of right lower extremity 07/11/2016  . Breast cancer screening 06/18/2016  . Knee pain 04/03/2016  . Hyponatremia 03/01/2016  . High triglycerides 12/24/2015  . Pes planus of both feet 11/16/2015  . Plantar fasciitis of right foot 11/16/2015  . Heel spur 11/16/2015  . Diabetic neuropathy (HCC) 11/16/2015  . Right ankle pain 11/16/2015  . Medication monitoring encounter 11/16/2015  . Diabetes mellitus without complication (HCC) 08/15/2015  . Chronic pain of multiple joints 08/15/2015  . Abnormal mammogram of right breast 06/19/2015  . Cerumen impaction 03/16/2015  . Hypertension goal BP (blood pressure) < 140/90 12/14/2014  . Hyperlipidemia LDL goal <100 12/14/2014  . History of transfusion of packed RBC 12/14/2014  . COPD, mild (HCC) 12/14/2014  . Hx of smoking 12/14/2014  . Status post bariatric surgery 12/14/2014  . Morbid obesity (HCC) 12/14/2014  . Chronic radicular lumbar pain 12/14/2014  . History of GI bleed 11/12/2014  . GERD without esophagitis 11/12/2014  . Acute upper GI bleeding 11/12/2014  . History of small bowel obstruction 09/07/2014  . Small bowel obstruction (HCC) 09/07/2014    Beacher May PT 12/04/2017, 7:14 PM  [place] [facility] PHYSICAL AND SPORTS MEDICINE [address] [place], [address] Phone: [phone]   Fax:  [phone]  Name: NATESHA HASSEY MRN: [id number] Date of Birth: 12-29-1945

## 2017-12-08 ENCOUNTER — Other Ambulatory Visit: Payer: Self-pay

## 2017-12-08 ENCOUNTER — Encounter: Payer: Self-pay | Admitting: Physical Therapy

## 2017-12-08 ENCOUNTER — Ambulatory Visit: Payer: Medicare HMO | Admitting: Physical Therapy

## 2017-12-08 DIAGNOSIS — M6281 Muscle weakness (generalized): Secondary | ICD-10-CM

## 2017-12-08 DIAGNOSIS — G8929 Other chronic pain: Secondary | ICD-10-CM

## 2017-12-08 DIAGNOSIS — M5441 Lumbago with sciatica, right side: Secondary | ICD-10-CM

## 2017-12-08 DIAGNOSIS — M62838 Other muscle spasm: Secondary | ICD-10-CM | POA: Diagnosis not present

## 2017-12-08 DIAGNOSIS — M5440 Lumbago with sciatica, unspecified side: Secondary | ICD-10-CM | POA: Diagnosis not present

## 2017-12-08 NOTE — Therapy (Signed)
[place] [facility] PHYSICAL AND SPORTS MEDICINE [address] [place], [address] Phone: [phone]   Fax:  [phone]  Physical Therapy Treatment  Patient Details  Name: Andrea Holden MRN: [id number] Date of Birth: 11/09/1945 Referring Provider: Marene Lenz DO   Encounter Date: 12/08/2017  PT End of Session - 12/08/17 1404    Visit Number  35    Number of Visits  38    Date for PT Re-Evaluation  12/18/17    Authorization Type   5 of 10 (progress)    PT Start Time  1155    PT Stop Time  1230    PT Time Calculation (min)  35 min    Activity Tolerance  Patient tolerated treatment well    Behavior During Therapy  [facility] for tasks assessed/performed       Past Medical History:  Diagnosis Date  . Allergy    seasonal allergies  . Anemia   . Anxiety   . Arthritis   . Asthma    allergy induced asthma  . Cataract   . Clotting disorder (HCC)    history of blood clots  . Complication of anesthesia    arrhythmia following colonoscopy  . Degenerative disc disease, lumbar   . Degenerative disc disease, lumbar   . Depression   . Diabetes mellitus   . Diabetic neuropathy (HCC) 11/16/2015  . Dysrhythmia   . GERD (gastroesophageal reflux disease)   . H/O eating disorder   . H/O transfusion    patient was given 5 units of blood while at [facility], blood type O+  . History of chicken pox   . History of hiatal hernia   . History of measles, mumps, or rubella   . HOH (hard of hearing)   . Hyperlipidemia   . Hypertension   . Irregular heartbeat   . Moderate COPD (chronic obstructive pulmonary disease) (HCC) 12/14/2014  . Neuropathy   . Opiate use 11/16/2015  . Orthopnea   . Pes planus of both feet 11/16/2015  . Plantar fasciitis of right foot 11/16/2015  . Wheezing     Past Surgical History:  Procedure Laterality Date  . ABDOMINAL HYSTERECTOMY    . CATARACT EXTRACTION W/PHACO Right 05/30/2015   Procedure: CATARACT EXTRACTION PHACO  AND INTRAOCULAR LENS PLACEMENT (IOC);  Surgeon: Galen Manila, MD;  Location: [facility] ORS;  Service: Ophthalmology;  Laterality: Right;  [place] 00:57 AP% 20.9 CDE 11.99 fluid pack lot #[id number]  . CHOLECYSTECTOMY    . COLON SURGERY     blocked colon  . FOOT FUSION Right 2018  . GALLBLADDER SURGERY    . GASTRIC BYPASS  2010  . internal bleeding  2016  . MOUTH SURGERY    . OVARY SURGERY    . SKIN GRAFT Right 2018   RT foot  . TENOTOMY ACHILLES TENDON     Percuntaneous  . TONSILLECTOMY      There were no vitals filed for this visit.  Subjective Assessment - 12/08/17 1157    Subjective  Patient arrived 10 min late to therapy due being delayed at home prior to leaving home    Pertinent History  chronic back pain and foot pain over many years since 1995 with previous physical therapy with good results. Patient reports falling 06/02/17 which caused increased back pain and she is now waing to have evaluation by spine specialist    Limitations  Sitting;Standing;Walking;House hold activities    How long can you sit comfortably?  <  1 hour    How long can you stand comfortably?  <20 min    How long can you walk comfortably?  only short distances    Patient Stated Goals  Patient wants to be abe to walk more normally with less pain in back to return to normal activities     Currently in Pain?  Yes    Pain Score  5     Pain Location  Back    Pain Orientation  Right;Left;Upper;Lower;Mid    Pain Descriptors / Indicators  Aching;Spasm    Pain Type  Chronic pain    Pain Onset  More than a month ago    Pain Frequency  Intermittent          Objective:  Palpation: spasmsand hypersensitive upper trapezius right side>left and left lower lumbar spine paraspinal muscles  Treatment: Manual Therapy:3213min. Goal: spasm, pain STM performed to backthoracic to lumbar region withconcentration onthoracic region right >left region and left lumbar region,superficial techniques seated in massage chair:  patient transfers on and off massage chair with close supervision and assistance of therapist to stabilize chair   Therapeutic exercise: patient performed with demonstration, verbal cues and assistance of therapist: goal: strenth, improved endurance, function  Straight arm pull downs withdoubled blueresistive bandx 15 Single arm rows doubled blue resistive bandx 15repseach palloff press 25x to abdomen  LE's:  sitting knee flexion blue resistive band 2 x 15 sitting knee extension 3# weights on ankles tapping balance stones x 25 reps each Hip adduction with ball and glute sets x 20 Hip abduction with resistive band x 15 reps  Patient was escorted to her car with supervision for safety at end of session  Patient response to treatment:Improved soft tissue elasticity with decreased spasms up to 50% following treatment. Exercises with minimal cuing for technique.      PT Education - 12/08/17 1404    Education provided  Yes    Education Details  exercise instruction for technique    Person(s) Educated  Patient    Methods  Explanation;Demonstration;Verbal cues    Comprehension  Verbalized understanding;Returned demonstration;Verbal cues required          PT Long Term Goals - 11/19/17 1200      PT LONG TERM GOAL #1   Title  Patient will improve MODI score to 40% or better demonstrating improved function with daily tasks and able to stand, sit and walk with less difficulty    Baseline  MODI 55%; 45% 09/09/17; 10/23/17 40%    Status  Achieved      PT LONG TERM GOAL #2   Title  Patient will ben independent with home program for pain control and progressive exercises for flexibility and strength in lumbar spine/right LE  in order to transition to home program     Baseline  limited knowldge of appropriate exercises and progression without cuing, instruction    Status  On-going    Target Date  12/18/17      PT LONG TERM GOAL #3   Title  Patient will improve MODI score  to 30% or better demonstrating improved function with daily tasks and able to stand, sit and walk with less difficulty    Baseline  MODI 55%; 40% 10/23/17    Status  On-going    Target Date  12/18/17            Plan - 12/08/17 1535    Clinical Impression Statement  Patient demonstrates improvement with decreasing spasms, intensity of pain  and improvement with exercises and endurance. She continues to improve with physical therapy intervention and will benefit from additional visits.    Rehab Potential  Fair    Clinical Impairments Affecting Rehab Potential  (+) motivated (-) muliple co morbidities, chronic condition    PT Frequency  2x / week    PT Duration  8 weeks    PT Treatment/Interventions  Electrical Stimulation;Moist Heat;Ultrasound;Patient/family education;Neuromuscular re-education;Therapeutic exercise;Manual techniques    PT Next Visit Plan  pain control, ther. ex, manual therapy    PT Home Exercise Plan  stabilization exercises; observe good body mechanics for daily tasks       Patient will benefit from skilled therapeutic intervention in order to improve the following deficits and impairments:  Decreased strength, Impaired flexibility, Decreased activity tolerance, Impaired perceived functional ability, Pain, Decreased endurance, Difficulty walking  Visit Diagnosis: Other muscle spasm  Chronic bilateral low back pain with right-sided sciatica  Muscle weakness (generalized)     Problem List Patient Active Problem List   Diagnosis Date Noted  . Gastrojejunal ulcer 12/04/2017  . Hyperphosphatemia 12/04/2017  . Vaginitis 12/04/2017  . Spinal stenosis of lumbar region 06/18/2017  . Degeneration of lumbar intervertebral disc 06/02/2017  . Assistance needed with transportation 05/26/2017  . Financial difficulties 05/26/2017  . Needs assistance with community resources 05/26/2017  . Moderate recurrent major depression (HCC) 05/26/2017  . Non-healing wound of lower  extremity 02/06/2017  . Status post ankle arthrodesis 12/11/2016  . Controlled substance agreement broken 10/11/2016  . Low back pain 09/25/2016  . Osteoarthritis of right subtalar joint 07/11/2016  . Posterior tibial tendinitis of right lower extremity 07/11/2016  . Breast cancer screening 06/18/2016  . Knee pain 04/03/2016  . Hyponatremia 03/01/2016  . High triglycerides 12/24/2015  . Pes planus of both feet 11/16/2015  . Plantar fasciitis of right foot 11/16/2015  . Heel spur 11/16/2015  . Diabetic neuropathy (HCC) 11/16/2015  . Right ankle pain 11/16/2015  . Medication monitoring encounter 11/16/2015  . Diabetes mellitus without complication (HCC) 08/15/2015  . Chronic pain of multiple joints 08/15/2015  . Abnormal mammogram of right breast 06/19/2015  . Cerumen impaction 03/16/2015  . Hypertension goal BP (blood pressure) < 140/90 12/14/2014  . Hyperlipidemia LDL goal <100 12/14/2014  . History of transfusion of packed RBC 12/14/2014  . COPD, mild (HCC) 12/14/2014  . Hx of smoking 12/14/2014  . Status post bariatric surgery 12/14/2014  . Morbid obesity (HCC) 12/14/2014  . Chronic radicular lumbar pain 12/14/2014  . History of GI bleed 11/12/2014  . GERD without esophagitis 11/12/2014  . Acute upper GI bleeding 11/12/2014  . History of small bowel obstruction 09/07/2014  . Small bowel obstruction (HCC) 09/07/2014    Beacher May PT 12/08/2017, 3:37 PM  [place] [facility] PHYSICAL AND SPORTS MEDICINE [address], [place], [address] Phone: [phone]   Fax:  [phone]  Name: Andrea Holden MRN: [id number] Date of Birth: [birth year]

## 2017-12-11 ENCOUNTER — Ambulatory Visit: Payer: Medicare HMO | Admitting: Physical Therapy

## 2017-12-11 ENCOUNTER — Encounter: Payer: Self-pay | Admitting: Physical Therapy

## 2017-12-11 DIAGNOSIS — M6281 Muscle weakness (generalized): Secondary | ICD-10-CM | POA: Diagnosis not present

## 2017-12-11 DIAGNOSIS — G8929 Other chronic pain: Secondary | ICD-10-CM

## 2017-12-11 DIAGNOSIS — M62838 Other muscle spasm: Secondary | ICD-10-CM | POA: Diagnosis not present

## 2017-12-11 DIAGNOSIS — M5441 Lumbago with sciatica, right side: Principal | ICD-10-CM

## 2017-12-11 DIAGNOSIS — M5440 Lumbago with sciatica, unspecified side: Secondary | ICD-10-CM | POA: Diagnosis not present

## 2017-12-11 NOTE — Therapy (Signed)
[facility] PHYSICAL AND SPORTS MEDICINE [address] [place], [place], [address] Phone: [phone]   Fax:  [phone]  Physical Therapy Treatment  Patient Details  Name: Andrea MangleDianna J Holden MRN: [id number] Date of Birth: 10-07-1945 Referring Provider: Marene LenzKalman, Arthur DO   Encounter Date: 12/11/2017  PT End of Session - 12/11/17 1237    Visit Number  36    Number of Visits  38    Date for PT Re-Evaluation  12/18/17    Authorization Type   6 of 10 (progress)    PT Start Time  1122    PT Stop Time  1207    PT Time Calculation (min)  45 min    Activity Tolerance  Patient tolerated treatment well    Behavior During Therapy  [facility] for tasks assessed/performed       Past Medical History:  Diagnosis Date  . Allergy    seasonal allergies  . Anemia   . Anxiety   . Arthritis   . Asthma    allergy induced asthma  . Cardiac murmur 12/12/2017  . Cataract   . Clotting disorder (HCC)    history of blood clots  . Complication of anesthesia    arrhythmia following colonoscopy  . Degenerative disc disease, lumbar   . Degenerative disc disease, lumbar   . Depression   . Diabetes mellitus   . Diabetic neuropathy (HCC) 11/16/2015  . Dysrhythmia   . GERD (gastroesophageal reflux disease)   . H/O eating disorder   . H/O transfusion    [facility]  . History of chicken pox   . History of hiatal hernia   . History of measles, mumps, or rubella   . HOH (hard of hearing)   . Hyperlipidemia   . Hypertension   . Irregular heartbeat   . Moderate COPD (chronic obstructive pulmonary disease) (HCC) 12/14/2014  . Neuropathy   . Opiate use 11/16/2015  . Orthopnea   . Pes planus of both feet 11/16/2015  . Plantar fasciitis of right foot 11/16/2015  . Wheezing     Past Surgical History:  Procedure Laterality Date  . ABDOMINAL HYSTERECTOMY    . CATARACT EXTRACTION W/PHACO Right 05/30/2015   Procedure:  CATARACT EXTRACTION PHACO AND INTRAOCULAR LENS PLACEMENT (IOC);  Surgeon: Galen ManilaWilliam Porfilio, MD;  Location: [facility] ORS;  Service: Ophthalmology;  Laterality: Right;  US 00:57 AP% 20.9 CDE 11.99 fluid pack lot #[id number]  . CHOLECYSTECTOMY    . COLON SURGERY     blocked colon  . FOOT FUSION Right 2018  . GALLBLADDER SURGERY    . GASTRIC BYPASS  2010  . internal bleeding  2016  . MOUTH SURGERY    . OVARY SURGERY    . SKIN GRAFT Right 2018   RT foot  . TENOTOMY ACHILLES TENDON     Percuntaneous  . TONSILLECTOMY      There were no vitals filed for this visit.  Subjective Assessment - 12/11/17 1126    Subjective  Patient reports her back is doing better    Pertinent History  chronic back pain and foot pain over many years since 1995 with previous physical therapy with good results. Patient reports falling 06/02/17 which caused increased back pain and she is now waing to have evaluation by spine specialist    Limitations  Sitting;Standing;Walking;House hold activities    How long can you sit comfortably?  <1 hour  How long can you stand comfortably?  <20 min    How long can you walk comfortably?  only short distances    Patient Stated Goals  Patient wants to be abe to walk more normally with less pain in back to return to normal activities     Currently in Pain?  Yes    Pain Score  3     Pain Location  Back    Pain Orientation  Right;Left;Lower;Mid    Pain Descriptors / Indicators  Aching;Spasm    Pain Type  Chronic pain    Pain Onset  More than a month ago    Pain Frequency  Intermittent       Objective:  Palpation: spasms and hypersensitive lower lumbar spine left side > right Gait: ambulating independently with cane; antalgic pattern; decreased weight shift to right LE Strength: right hip abduction decreased as noted with ambulation and exercises    Treatment: Manual Therapy: 13 min. Goal: spasm, pain STM performed to back thoracic to lumbar region with concentration on  lumbar region right  and left region superficial techniques seated in massage chair: patient transfers on and off massage chair with close supervision and assistance of therapist to stabilize chair    Therapeutic exercise: patient performed with demonstration, verbal cues and assistance of therapist: goal: strenth, improved endurance, function     Straight arm pull downs with doubled blue resistive band   x 15 bilateral rows doubled blue resistive band x 15 reps palloff press  25x to abdomen   LE's:  sitting knee flexion blue resistive band 2 x 15 sitting knee extension 3# weights on ankles tapping balance stones x 25 reps each Hip adduction with ball and glute sets x 20 Hip abduction with resistive band x 15 reps  NUStep x 5 min level #3 intensity: patient assisted on/off of NuStep and monitored for pain throughout; no increased pain reported and mild fatigue noted at end of session   Patient was escorted to her car with supervision for safety at end of session   Patient response to treatment: Improved soft tissue elasticity with decreased tenderness 30% folloiwng STM. minimal cuing required for exercise technique.     PT Education - 12/11/17 1236    Education provided  Yes    Education Details  exercise instruction    Person(s) Educated  Patient    Methods  Explanation;Demonstration;Verbal cues    Comprehension  Verbalized understanding;Returned demonstration;Verbal cues required          PT Long Term Goals - 11/19/17 1200      PT LONG TERM GOAL #1   Title  Patient will improve MODI score to 40% or better demonstrating improved function with daily tasks and able to stand, sit and walk with less difficulty    Baseline  MODI 55%; 45% 09/09/17; 10/23/17 40%    Status  Achieved      PT LONG TERM GOAL #2   Title  Patient will ben independent with home program for pain control and progressive exercises for flexibility and strength in lumbar spine/right LE  in order to transition  to home program     Baseline  limited knowldge of appropriate exercises and progression without cuing, instruction    Status  On-going    Target Date  12/18/17      PT LONG TERM GOAL #3   Title  Patient will improve MODI score to 30% or better demonstrating improved function with daily tasks and able to stand,  sit and walk with less difficulty    Baseline  MODI 55%; 40% 10/23/17    Status  On-going    Target Date  12/18/17            Plan - 12/11/17 1144    Clinical Impression Statement  Patient continues steady progress with goals with decreasing pain in back, decreasing spasm improving function with daily activities with less difficulty. She continues with decreased strength and endurance and should continue to improve with additional physical therapy intervention.     Rehab Potential  Fair    Clinical Impairments Affecting Rehab Potential  (+) motivated (-) muliple co morbidities, chronic condition    PT Frequency  2x / week    PT Duration  8 weeks    PT Treatment/Interventions  Electrical Stimulation;Moist Heat;Ultrasound;Patient/family education;Neuromuscular re-education;Therapeutic exercise;Manual techniques    PT Next Visit Plan  pain control, ther. ex, manual therapy    PT Home Exercise Plan  stabilization exercises; observe good body mechanics for daily tasks       Patient will benefit from skilled therapeutic intervention in order to improve the following deficits and impairments:  Decreased strength, Impaired flexibility, Decreased activity tolerance, Impaired perceived functional ability, Pain, Decreased endurance, Difficulty walking  Visit Diagnosis: Chronic bilateral low back pain with right-sided sciatica  Muscle weakness (generalized)  Other muscle spasm     Problem List Patient Active Problem List   Diagnosis Date Noted  . Left atrial dilatation 12/12/2017  . Cardiac murmur 12/12/2017  . Gastrojejunal ulcer 12/04/2017  . Hyperphosphatemia 12/04/2017   . Vaginitis 12/04/2017  . Spinal stenosis of lumbar region 06/18/2017  . Degeneration of lumbar intervertebral disc 06/02/2017  . Assistance needed with transportation 05/26/2017  . Financial difficulties 05/26/2017  . Needs assistance with community resources 05/26/2017  . Moderate recurrent major depression (HCC) 05/26/2017  . Non-healing wound of lower extremity 02/06/2017  . Status post ankle arthrodesis 12/11/2016  . Controlled substance agreement broken 10/11/2016  . Low back pain 09/25/2016  . Osteoarthritis of right subtalar joint 07/11/2016  . Posterior tibial tendinitis of right lower extremity 07/11/2016  . Breast cancer screening 06/18/2016  . Knee pain 04/03/2016  . Hyponatremia 03/01/2016  . High triglycerides 12/24/2015  . Pes planus of both feet 11/16/2015  . Plantar fasciitis of right foot 11/16/2015  . Heel spur 11/16/2015  . Diabetic neuropathy (HCC) 11/16/2015  . Right ankle pain 11/16/2015  . Medication monitoring encounter 11/16/2015  . Diabetes mellitus without complication (HCC) 08/15/2015  . Chronic pain of multiple joints 08/15/2015  . Abnormal mammogram of right breast 06/19/2015  . Cerumen impaction 03/16/2015  . Hypertension goal BP (blood pressure) < 140/90 12/14/2014  . Hyperlipidemia LDL goal <100 12/14/2014  . History of transfusion of packed RBC 12/14/2014  . COPD, mild (HCC) 12/14/2014  . Hx of smoking 12/14/2014  . Status post bariatric surgery 12/14/2014  . Morbid obesity (HCC) 12/14/2014  . Chronic radicular lumbar pain 12/14/2014  . History of GI bleed 11/12/2014  . GERD without esophagitis 11/12/2014  . History of small bowel obstruction 09/07/2014    Beacher May PT 12/12/2017, 11:47 AM  [facility] PHYSICAL AND SPORTS MEDICINE [address], [place], [address] Phone: [phone]   Fax:  [phone]  Name: LURINE IMEL MRN: [id number] Date of Birth: 12/24/1945

## 2017-12-12 ENCOUNTER — Telehealth: Payer: Self-pay

## 2017-12-12 ENCOUNTER — Encounter: Payer: Self-pay | Admitting: Family Medicine

## 2017-12-12 ENCOUNTER — Ambulatory Visit (INDEPENDENT_AMBULATORY_CARE_PROVIDER_SITE_OTHER): Payer: Medicare HMO | Admitting: Family Medicine

## 2017-12-12 VITALS — BP 122/74 | HR 76 | Temp 97.6°F | Resp 14 | Ht 62.0 in | Wt 217.2 lb

## 2017-12-12 DIAGNOSIS — I350 Nonrheumatic aortic (valve) stenosis: Secondary | ICD-10-CM

## 2017-12-12 DIAGNOSIS — E781 Pure hyperglyceridemia: Secondary | ICD-10-CM

## 2017-12-12 DIAGNOSIS — E785 Hyperlipidemia, unspecified: Secondary | ICD-10-CM

## 2017-12-12 DIAGNOSIS — I10 Essential (primary) hypertension: Secondary | ICD-10-CM | POA: Diagnosis not present

## 2017-12-12 DIAGNOSIS — R011 Cardiac murmur, unspecified: Secondary | ICD-10-CM | POA: Diagnosis not present

## 2017-12-12 DIAGNOSIS — G8929 Other chronic pain: Secondary | ICD-10-CM

## 2017-12-12 DIAGNOSIS — E1142 Type 2 diabetes mellitus with diabetic polyneuropathy: Secondary | ICD-10-CM | POA: Diagnosis not present

## 2017-12-12 DIAGNOSIS — Z5181 Encounter for therapeutic drug level monitoring: Secondary | ICD-10-CM

## 2017-12-12 DIAGNOSIS — Z9884 Bariatric surgery status: Secondary | ICD-10-CM

## 2017-12-12 DIAGNOSIS — I517 Cardiomegaly: Secondary | ICD-10-CM | POA: Diagnosis not present

## 2017-12-12 DIAGNOSIS — E119 Type 2 diabetes mellitus without complications: Secondary | ICD-10-CM | POA: Diagnosis not present

## 2017-12-12 DIAGNOSIS — M25571 Pain in right ankle and joints of right foot: Secondary | ICD-10-CM | POA: Diagnosis not present

## 2017-12-12 DIAGNOSIS — I119 Hypertensive heart disease without heart failure: Secondary | ICD-10-CM

## 2017-12-12 HISTORY — DX: Cardiac murmur, unspecified: R01.1

## 2017-12-12 NOTE — Assessment & Plan Note (Addendum)
Foot exam by MD; healthy eating, weight loss would be helpful; eye exams yearly

## 2017-12-12 NOTE — Assessment & Plan Note (Signed)
Controlled today 

## 2017-12-12 NOTE — Assessment & Plan Note (Signed)
Down from 325 pounds peak weight

## 2017-12-12 NOTE — Patient Instructions (Addendum)
Check out the information at familydoctor.org entitled "Nutrition for Weight Loss: What You Need to Know about Fad Diets" Try to lose between 1-2 pounds per week by taking in fewer calories and burning off more calories You can succeed by limiting portions, limiting foods dense in calories and fat, becoming more active, and drinking 8 glasses of water a day (64 ounces) Don't skip meals, especially breakfast, as skipping meals may alter your metabolism Do not use over-the-counter weight loss pills or gimmicks that claim rapid weight loss A healthy BMI (or body mass index) is between 18.5 and 24.9 You can calculate your ideal BMI at the NIH website [employer].huhttp://www.nhlbi.nih.gov/health/educational/lose_wt/BMI/bmicalc.htm Try to increase water intake, limit the coffee a little bit Consider counting total calories for 3-5 days just for information for you Let's get labs today If you have not heard anything from my staff in a week about any orders/referrals/studies from today, please contact us here to follow-up [phone]

## 2017-12-12 NOTE — Assessment & Plan Note (Signed)
Saw cardiologist; had echo

## 2017-12-12 NOTE — Assessment & Plan Note (Signed)
Check lipids today; consider statin, and she agrees

## 2017-12-12 NOTE — Assessment & Plan Note (Signed)
Check lipids 

## 2017-12-12 NOTE — Assessment & Plan Note (Signed)
At risk for A-fib in the future; be aware of symptoms; seek medical attention right away if occurs

## 2017-12-12 NOTE — Progress Notes (Signed)
BP 122/74   Pulse 76   Temp 97.6 F (36.4 C) (Oral)   Resp 14   Ht 5\' 2"  (1.575 m)   Wt 217 lb 3.2 oz (98.5 kg)   SpO2 92%   BMI 39.73 kg/m    Subjective:    Patient ID: Andrea Holden, female    DOB: [birth year], 72 y.o.   MRN: [id number]  HPI: Andrea Holden is a 72 y.o. female  Chief Complaint  Patient presents with  . Follow-up    HPI  Here for f/u She is still having issues with her feet; going to see her specialist, Dr. 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; no fevers or body aches  Cardiac murmur; had echo prior to surgery; saw cardiologist in August; had echo; reviewed INTERPRETATION ---------------------------------------------------------------  NORMAL LEFT VENTRICULAR SYSTOLIC FUNCTION WITH MILD LVH  NORMAL RIGHT VENTRICULAR SYSTOLIC FUNCTION  VALVULAR REGURGITATION: TRIVIAL AR, TRIVIAL MR, TRIVIAL TR  VALVULAR STENOSIS: MILD AS  MILD AS  NO PRIOR STUDY FOR COMPARISON   High cholesterol; likes butter, likes cheese; "I'm from [place]"; took a statin for a while but doesn't remember if any problems; not listed in allergies  Depression screen [facility] 2/9 12/12/2017 10/22/2017 10/17/2017 09/23/2017 06/09/2017  Decreased Interest 0 1 1 0 0  Down, Depressed, Hopeless 0 1 1 1 1   PHQ - 2 Score 0 2 2 1 1   Altered sleeping 1 - 2 - -  Tired, decreased energy 1 - 2 - -  Change in appetite 0 - 2 - -  Feeling bad or  failure about yourself  0 - 1 - -  Trouble concentrating 0 - 3 - -  Moving slowly or fidgety/restless 0 - 0 - -  Suicidal thoughts 0 - 0 - -  PHQ-9 Score 2 - 12 - -  Difficult doing work/chores Not difficult at all - Somewhat difficult - -   Fall Risk  12/12/2017 11/05/2017 10/22/2017 10/17/2017 09/23/2017  Falls in the past year? Yes Yes Yes Yes Yes  Comment - no falls since 09/2017 visit - states she stepped in to kitchen from outside and fell. States it had been raining -  Number falls in past yr: 1 - 1 1 1   Injury with Fall? Yes - No No No  Risk for fall due to : - - History of fall(s);Impaired balance/gait;Impaired vision History of fall(s);Impaired vision;Impaired balance/gait;Medication side effect -  Risk for fall due to: Comment - - - wears reading glasses, cataracts, Sertraline and Percocet; walks with cane -  Follow up - - - Falls evaluation completed;Education provided;Falls prevention discussed -   Functional Status Survey: Is the patient deaf or have difficulty hearing?: No Does the patient have difficulty seeing, even when wearing glasses/contacts?: No Does the patient have difficulty concentrating, remembering, or making decisions?: No Does the patient have difficulty walking or climbing stairs?: Yes Does the patient have difficulty dressing or bathing?:  No Does the patient have difficulty doing errands alone such as visiting a doctor's office or shopping?: No  Relevant past medical, surgical, family and social history reviewed Past Medical History:  Diagnosis Date  . Allergy    seasonal allergies  . Anemia   . Anxiety   . Aortic stenosis 12/24/2017   Echo Aug 2018  . Arthritis   . Asthma    allergy induced asthma  . Cardiac murmur 12/12/2017  . Cataract   . Clotting disorder (HCC)    history of blood clots  . Complication of anesthesia    arrhythmia following colonoscopy  . Degenerative disc disease, lumbar   . Degenerative disc disease, lumbar   . Depression   .  Diabetes mellitus   . Diabetic neuropathy (HCC) 11/16/2015  . Dysrhythmia   . GERD (gastroesophageal reflux disease)   . H/O eating disorder   . H/O transfusion    [facility] type O+  . History of chicken pox   . History of hiatal hernia   . History of measles, mumps, or rubella   . HOH (hard of hearing)   . Hyperlipidemia   . Hypertension   . Irregular heartbeat   . LVH (left ventricular hypertrophy) 12/24/2017   Echo Aug 2018  . Moderate COPD (chronic obstructive pulmonary disease) (HCC) 12/14/2014  . Neuropathy   . Opiate use 11/16/2015  . Orthopnea   . Pes planus of both feet 11/16/2015  . Plantar fasciitis of right foot 11/16/2015  . Wheezing    Past Surgical History:  Procedure Laterality Date  . ABDOMINAL HYSTERECTOMY    . CATARACT EXTRACTION W/PHACO Right 05/30/2015   Procedure: CATARACT EXTRACTION PHACO AND INTRAOCULAR LENS PLACEMENT (IOC);  Surgeon: Galen ManilaWilliam Porfilio, MD;  Location: [facility] ORS;  Service: Ophthalmology;  Laterality: Right;  US 00:57 AP% 20.9 CDE 11.99 fluid pack lot #[id number]  . CHOLECYSTECTOMY    . COLON SURGERY     blocked colon  . FOOT FUSION Right 2018  . GALLBLADDER SURGERY    . GASTRIC BYPASS  2010  . internal bleeding  2016  . MOUTH SURGERY    . OVARY SURGERY    . SKIN GRAFT Right 2018   RT foot  . TENOTOMY ACHILLES TENDON     Percuntaneous  . TONSILLECTOMY     Family History  Problem Relation Age of Onset  . Asthma Mother   . COPD Mother   . Arthritis Father   . Depression Father   . Heart disease Father   . Hypertension Father   . Stroke Father   . Arthritis Brother   . Depression Brother   . Diabetes Brother   . Heart disease Brother   . Hyperlipidemia Brother   . Hypertension Brother   . Stroke Brother   . Vision loss Brother   . Diabetes Maternal Grandmother   . Thyroid disease Daughter   . Colitis Daughter   . Breast cancer Neg Hx    Social History   Tobacco Use  .  Smoking status: Former Smoker    Packs/day: 1.50    Years: 55.00    Pack years: 82.50    Types: Cigarettes    Start date: 07/01/1960    Last attempt to quit: 08/26/2016    Years since quitting: 1.3  . Smokeless tobacco: Never Used  . Tobacco comment: smoking cessation materials not required  Substance Use Topics  . Alcohol use: Yes  Alcohol/week: 0.0 oz    Comment: 2 drinks per month  . Drug use: Not Currently    Types: Marijuana    Comment: used for pain    Interim medical history since last visit reviewed. Allergies and medications reviewed  Review of Systems Per HPI unless specifically indicated above     Objective:    BP 122/74   Pulse 76   Temp 97.6 F (36.4 C) (Oral)   Resp 14   Ht 5\' 2"  (1.575 m)   Wt 217 lb 3.2 oz (98.5 kg)   SpO2 92%   BMI 39.73 kg/m    Physical Exam  Constitutional: She appears well-developed and well-nourished. No distress.  Morbidly obese  HENT:  Head: Normocephalic and atraumatic.  Eyes: EOM are normal. No scleral icterus.  Neck: No thyromegaly present.  Cardiovascular: Normal rate and regular rhythm.  Murmur heard. Pulmonary/Chest: Effort normal and breath sounds normal. No respiratory distress. She has no wheezes.  Abdominal: Soft. Bowel sounds are normal. She exhibits no distension.  Musculoskeletal: She exhibits no edema.  Neurological: She is alert. She exhibits normal muscle tone.  Skin: Skin is warm and dry. She is not diaphoretic. No pallor.  Psychiatric: She has a normal mood and affect. Her behavior is normal. Judgment and thought content normal.   Diabetic Foot Form - Detailed   Diabetic Foot Exam - detailed Diabetic Foot exam was performed with the following findings:  Yes 12/12/2017 12:53 PM  Visual Foot Exam completed.:  Yes  Pulse Foot Exam completed.:  Yes  Right Dorsalis Pedis:  Diminished Left Dorsalis Pedis:  Diminished  Sensory Foot Exam Completed.:  Yes Semmes-Weinstein Monofilament Test            Assessment & Plan:   Problem List Items Addressed This Visit      Cardiovascular and Mediastinum   LVH (left ventricular hypertrophy) (Chronic)    Weight loss and BP control key; seeing cardiologist      Left atrial dilatation (Chronic)    At risk for A-fib in the future; be aware of symptoms; seek medical attention right away if occurs      Hypertension with heart disease (Chronic)    LVH present on echocardiogram; weight loss important; control BP      RESOLVED: Hypertension goal BP (blood pressure) < 140/90    Controlled today      Aortic stenosis (Chronic)    Seeing cardiologist        Endocrine   Diabetic neuropathy (HCC) (Chronic)    Foot exam by MD; healthy eating, weight loss would be helpful; eye exams yearly      RESOLVED: Diabetes mellitus without complication (HCC) - Primary (Chronic)   Relevant Orders   Hemoglobin A1C (Completed)   Microalbumin / creatinine urine ratio (Completed)     Other   Medication monitoring encounter   Relevant Orders   COMPLETE METABOLIC PANEL WITH GFR (Completed)   Status post bariatric surgery (Chronic)    Down from 325 pounds peak weight      Right ankle pain    Improved somewhat, but she'll see the podiatrist; continuing PT; she's paying attention to her body and limiting when needed      High triglycerides    Check lipids      Cardiac murmur    Saw cardiologist; had echo      Morbid obesity (HCC) (Chronic)    Years ago, had bariatric surgery; was down 178 pounds but gained back; no sugary drinks  Hyperlipidemia LDL goal <100 (Chronic)    Check lipids today; consider statin, and she agrees      Relevant Orders   Lipid panel (Completed)       Follow up plan: No follow-ups on file.  An after-visit summary was printed and given to the patient at check-out.  Please see the patient instructions which may contain other information and recommendations beyond what is mentioned above in the assessment and  plan.  No orders of the defined types were placed in this encounter.   Orders Placed This Encounter  Procedures  . Hemoglobin A1C  . Microalbumin / creatinine urine ratio  . Lipid panel  . COMPLETE METABOLIC PANEL WITH GFR

## 2017-12-12 NOTE — Assessment & Plan Note (Signed)
Improved somewhat, but she'll see the podiatrist; continuing PT; she's paying attention to her body and limiting when needed

## 2017-12-12 NOTE — Assessment & Plan Note (Signed)
Years ago, had bariatric surgery; was down 178 pounds but gained back; no sugary drinks

## 2017-12-13 LAB — LIPID PANEL
Cholesterol: 183 mg/dL (ref <200)
HDL: 45 mg/dL — ABNORMAL LOW (ref >50)
LDL Cholesterol (Calc): 116 mg/dL (calc) — ABNORMAL HIGH
Non-HDL Cholesterol (Calc): 138 mg/dL (calc) — ABNORMAL HIGH (ref <130)
Total CHOL/HDL Ratio: 4.1 (calc) (ref <5.0)
Triglycerides: 114 mg/dL (ref <150)

## 2017-12-13 LAB — COMPLETE METABOLIC PANEL WITH GFR
AG Ratio: 1.6 (calc) (ref 1.0–2.5)
ALT: 8 U/L (ref 6–29)
AST: 13 U/L (ref 10–35)
Albumin: 4.2 g/dL (ref 3.6–5.1)
Alkaline phosphatase (APISO): 80 U/L (ref 33–130)
BUN: 21 mg/dL (ref 7–25)
CO2: 28 mmol/L (ref 20–32)
Calcium: 9.4 mg/dL (ref 8.6–10.4)
Chloride: 103 mmol/L (ref 98–110)
Creat: 0.9 mg/dL (ref 0.60–0.93)
GFR, Est African American: 75 mL/min/{1.73_m2} (ref >=60)
GFR, Est Non African American: 65 mL/min/{1.73_m2} (ref >=60)
Globulin: 2.6 g/dL (calc) (ref 1.9–3.7)
Glucose, Bld: 138 mg/dL (ref 65–139)
Potassium: 4.9 mmol/L (ref 3.5–5.3)
Sodium: 137 mmol/L (ref 135–146)
Total Bilirubin: 0.3 mg/dL (ref 0.2–1.2)
Total Protein: 6.8 g/dL (ref 6.1–8.1)

## 2017-12-13 LAB — MICROALBUMIN / CREATININE URINE RATIO
Creatinine, Urine: 55 mg/dL (ref 20–275)
Microalb Creat Ratio: 11 mcg/mg creat (ref <30)
Microalb, Ur: 0.6 mg/dL

## 2017-12-13 LAB — HEMOGLOBIN A1C
Hgb A1c MFr Bld: 6.8 % of total Hgb — ABNORMAL HIGH (ref <5.7)
Mean Plasma Glucose: 148 (calc)
eAG (mmol/L): 8.2 (calc)

## 2017-12-15 ENCOUNTER — Encounter: Payer: PPO | Admitting: Physical Therapy

## 2017-12-15 NOTE — Telephone Encounter (Signed)
erro  neous encounter

## 2017-12-16 ENCOUNTER — Encounter: Payer: Self-pay | Admitting: Physical Therapy

## 2017-12-16 ENCOUNTER — Ambulatory Visit: Payer: Medicare HMO | Admitting: Physical Therapy

## 2017-12-16 DIAGNOSIS — M5441 Lumbago with sciatica, right side: Secondary | ICD-10-CM | POA: Diagnosis not present

## 2017-12-16 DIAGNOSIS — G8929 Other chronic pain: Secondary | ICD-10-CM

## 2017-12-16 DIAGNOSIS — M62838 Other muscle spasm: Secondary | ICD-10-CM | POA: Diagnosis not present

## 2017-12-16 DIAGNOSIS — M6281 Muscle weakness (generalized): Secondary | ICD-10-CM | POA: Diagnosis not present

## 2017-12-16 DIAGNOSIS — M5440 Lumbago with sciatica, unspecified side: Secondary | ICD-10-CM | POA: Diagnosis not present

## 2017-12-16 NOTE — Therapy (Signed)
[facility] PHYSICAL AND SPORTS MEDICINE [address], [place], [address] Phone: [phone]   Fax:  [phone]  Physical Therapy Treatment  Patient Details  Name: Andrea Holden MRN: [id number] Date of Birth: 02/14/1946 Referring Provider: Marene Lenz DO   Encounter Date: 12/16/2017  PT End of Session - 12/16/17 1126    Visit Number  37    Number of Visits  38    Date for PT Re-Evaluation  12/18/17    Authorization Type   7 of 10 (progress)    PT Start Time  1120    PT Stop Time  1205    PT Time Calculation (min)  45 min    Activity Tolerance  Patient tolerated treatment well    Behavior During Therapy  [facility] for tasks assessed/performed       Past Medical History:  Diagnosis Date  . Allergy    seasonal allergies  . Anemia   . Anxiety   . Arthritis   . Asthma    allergy induced asthma  . Cardiac murmur 12/12/2017  . Cataract   . Clotting disorder (HCC)    history of blood clots  . Complication of anesthesia    arrhythmia following colonoscopy  . Degenerative disc disease, lumbar   . Degenerative disc disease, lumbar   . Depression   . Diabetes mellitus   . Diabetic neuropathy (HCC) 11/16/2015  . Dysrhythmia   . GERD (gastroesophageal reflux disease)   . H/O eating disorder   . H/O transfusion    patient was given 5 units of blood while at [facility], blood type O+  . History of chicken pox   . History of hiatal hernia   . History of measles, mumps, or rubella   . HOH (hard of hearing)   . Hyperlipidemia   . Hypertension   . Irregular heartbeat   . Moderate COPD (chronic obstructive pulmonary disease) (HCC) 12/14/2014  . Neuropathy   . Opiate use 11/16/2015  . Orthopnea   . Pes planus of both feet 11/16/2015  . Plantar fasciitis of right foot 11/16/2015  . Wheezing     Past Surgical History:  Procedure Laterality Date  . ABDOMINAL HYSTERECTOMY    . CATARACT EXTRACTION W/PHACO Right 05/30/2015   Procedure:  CATARACT EXTRACTION PHACO AND INTRAOCULAR LENS PLACEMENT (IOC);  Surgeon: Galen Manila, MD;  Location: [facility] ORS;  Service: Ophthalmology;  Laterality: Right;  [place] 00:57 AP% 20.9 CDE 11.99 fluid pack lot #[id number]  . CHOLECYSTECTOMY    . COLON SURGERY     blocked colon  . FOOT FUSION Right 2018  . GALLBLADDER SURGERY    . GASTRIC BYPASS  2010  . internal bleeding  2016  . MOUTH SURGERY    . OVARY SURGERY    . SKIN GRAFT Right 2018   RT foot  . TENOTOMY ACHILLES TENDON     Percuntaneous  . TONSILLECTOMY      There were no vitals filed for this visit.  Subjective Assessment - 12/16/17 1123    Subjective  Patient reports she is doing better with less back symptoms and able to more around the house andis working in her yard with less difficulty.     Pertinent History  chronic back pain and foot pain over many years since 1995 with previous physical therapy with good results. Patient reports falling 06/02/17 which caused increased back pain and she is now waing to have evaluation by spine specialist  Limitations  Sitting;Standing;Walking;House hold activities    How long can you sit comfortably?  <1 hour    How long can you stand comfortably?  <20 min    How long can you walk comfortably?  only short distances    Patient Stated Goals  Patient wants to be abe to walk more normally with less pain in back to return to normal activities     Currently in Pain?  Yes    Pain Score  3     Pain Location  Back    Pain Orientation  Lower;Right;Left;Mid    Pain Descriptors / Indicators  Aching;Spasm    Pain Type  Chronic pain    Pain Onset  More than a month ago    Pain Frequency  Intermittent          Objective:  Palpation: spasms and hypersensitive lower lumbar spine left side > right Gait: ambulating independently with cane; antalgic pattern; decreased weight shift to right LE; decreased scissoring right LE during swign through    Treatment: Manual Therapy: 12 min. Goal: spasm,  pain STM performed to back thoracic to lumbar region with concentration on lumbar region right  and left region superficial techniques seated in massage chair: patient transfers on and off massage chair with close supervision and assistance of therapist to stabilize chair    Therapeutic exercise: patient performed with demonstration, verbal cues and assistance of therapist: goal: strenth, improved endurance, function     Straight arm pull downs with blue resistive band   x 15 single arm rows doubled blue resistive band x 15 reps palloff press  25x to abdomen   LE's:  sitting knee flexion blue resistive band 2 x 15 sitting knee extension 3# weights on ankles tapping balance stones x 25 reps each Hip adduction with ball and glute sets x 20   Leg press 45# x 25 reps with assistanct to place LE's on plate   Patient was escorted to her car with supervision for safety at end of session   Patient response to treatment: Improved soft tissue elasticity with decreased tenderness 30% folloiwng STM. minimal cuing required for exercise technique.      PT Education - 12/16/17 1125    Education provided  Yes    Education Details  exercise instruction    Person(s) Educated  Patient    Methods  Explanation;Demonstration;Verbal cues    Comprehension  Verbalized understanding;Returned demonstration;Verbal cues required          PT Long Term Goals - 11/19/17 1200      PT LONG TERM GOAL #1   Title  Patient will improve MODI score to 40% or better demonstrating improved function with daily tasks and able to stand, sit and walk with less difficulty    Baseline  MODI 55%; 45% 09/09/17; 10/23/17 40%    Status  Achieved      PT LONG TERM GOAL #2   Title  Patient will ben independent with home program for pain control and progressive exercises for flexibility and strength in lumbar spine/right LE  in order to transition to home program     Baseline  limited knowldge of appropriate exercises and  progression without cuing, instruction    Status  On-going    Target Date  12/18/17      PT LONG TERM GOAL #3   Title  Patient will improve MODI score to 30% or better demonstrating improved function with daily tasks and able to stand, sit and walk  with less difficulty    Baseline  MODI 55%; 40% 10/23/17    Status  On-going    Target Date  12/18/17            Plan - 12/16/17 1214    Clinical Impression Statement  Patient demonstrates steady improvement with decreasing pain in back and improving function with daily activities. She continues with limitations of decreased strength iin core and right LE hip musculature and requires guidance and instruction to perform appropriate exercises and progression and will benefit from continued physical therapy intervention.     Rehab Potential  Fair    Clinical Impairments Affecting Rehab Potential  (+) motivated (-) muliple co morbidities, chronic condition    PT Frequency  2x / week    PT Duration  8 weeks    PT Treatment/Interventions  Electrical Stimulation;Moist Heat;Ultrasound;Patient/family education;Neuromuscular re-education;Therapeutic exercise;Manual techniques    PT Next Visit Plan  pain control, ther. ex, manual therapy    PT Home Exercise Plan  stabilization exercises; observe good body mechanics for daily tasks       Patient will benefit from skilled therapeutic intervention in order to improve the following deficits and impairments:  Decreased strength, Impaired flexibility, Decreased activity tolerance, Impaired perceived functional ability, Pain, Decreased endurance, Difficulty walking  Visit Diagnosis: Chronic bilateral low back pain with right-sided sciatica  Muscle weakness (generalized)  Other muscle spasm     Problem List Patient Active Problem List   Diagnosis Date Noted  . Left atrial dilatation 12/12/2017  . Cardiac murmur 12/12/2017  . Gastrojejunal ulcer 12/04/2017  . Hyperphosphatemia 12/04/2017  .  Vaginitis 12/04/2017  . Spinal stenosis of lumbar region 06/18/2017  . Degeneration of lumbar intervertebral disc 06/02/2017  . Assistance needed with transportation 05/26/2017  . Financial difficulties 05/26/2017  . Needs assistance with community resources 05/26/2017  . Moderate recurrent major depression (HCC) 05/26/2017  . Non-healing wound of lower extremity 02/06/2017  . Status post ankle arthrodesis 12/11/2016  . Controlled substance agreement broken 10/11/2016  . Low back pain 09/25/2016  . Osteoarthritis of right subtalar joint 07/11/2016  . Posterior tibial tendinitis of right lower extremity 07/11/2016  . Breast cancer screening 06/18/2016  . Knee pain 04/03/2016  . Hyponatremia 03/01/2016  . High triglycerides 12/24/2015  . Pes planus of both feet 11/16/2015  . Plantar fasciitis of right foot 11/16/2015  . Heel spur 11/16/2015  . Diabetic neuropathy (HCC) 11/16/2015  . Right ankle pain 11/16/2015  . Medication monitoring encounter 11/16/2015  . Diabetes mellitus without complication (HCC) 08/15/2015  . Chronic pain of multiple joints 08/15/2015  . Abnormal mammogram of right breast 06/19/2015  . Cerumen impaction 03/16/2015  . Hypertension goal BP (blood pressure) < 140/90 12/14/2014  . Hyperlipidemia LDL goal <100 12/14/2014  . History of transfusion of packed RBC 12/14/2014  . COPD, mild (HCC) 12/14/2014  . Hx of smoking 12/14/2014  . Status post bariatric surgery 12/14/2014  . Morbid obesity (HCC) 12/14/2014  . Chronic radicular lumbar pain 12/14/2014  . History of GI bleed 11/12/2014  . GERD without esophagitis 11/12/2014  . History of small bowel obstruction 09/07/2014    Beacher May PT 12/17/2017, 9:04 AM  [facility] PHYSICAL AND SPORTS MEDICINE [address], [place], [address] Phone: [phone]   Fax:  [phone]  Name: Andrea Holden MRN: [id number] Date of Birth: Apr 15, 1946

## 2017-12-18 ENCOUNTER — Encounter: Payer: Self-pay | Admitting: Physical Therapy

## 2017-12-18 ENCOUNTER — Ambulatory Visit: Payer: Medicare HMO | Admitting: Physical Therapy

## 2017-12-18 DIAGNOSIS — G8929 Other chronic pain: Secondary | ICD-10-CM | POA: Diagnosis not present

## 2017-12-18 DIAGNOSIS — M62838 Other muscle spasm: Secondary | ICD-10-CM

## 2017-12-18 DIAGNOSIS — M6281 Muscle weakness (generalized): Secondary | ICD-10-CM

## 2017-12-18 DIAGNOSIS — M5441 Lumbago with sciatica, right side: Secondary | ICD-10-CM | POA: Diagnosis not present

## 2017-12-18 DIAGNOSIS — M5440 Lumbago with sciatica, unspecified side: Secondary | ICD-10-CM | POA: Diagnosis not present

## 2017-12-18 NOTE — Therapy (Signed)
[facility] PHYSICAL AND SPORTS MEDICINE [address] [place], [place], [address] Phone: [phone]   Fax:  [phone]  Physical Therapy Treatment  Patient Details  Name: Andrea Holden MRN: [id number] Date of Birth: 07-12-1945 Referring Provider: Marene LenzKalman, Arthur DO   Encounter Date: 12/18/2017  PT End of Session - 12/18/17 1220    Visit Number  38    Number of Visits  38    Date for PT Re-Evaluation  12/18/17    Authorization Type   8 of 10 (progress)    PT Start Time  1125    PT Stop Time  1205    PT Time Calculation (min)  40 min    Activity Tolerance  Patient tolerated treatment well    Behavior During Therapy  [facility] for tasks assessed/performed       Past Medical History:  Diagnosis Date  . Allergy    seasonal allergies  . Anemia   . Anxiety   . Arthritis   . Asthma    allergy induced asthma  . Cardiac murmur 12/12/2017  . Cataract   . Clotting disorder (HCC)    history of blood clots  . Complication of anesthesia    arrhythmia following colonoscopy  . Degenerative disc disease, lumbar   . Degenerative disc disease, lumbar   . Depression   . Diabetes mellitus   . Diabetic neuropathy (HCC) 11/16/2015  . Dysrhythmia   . GERD (gastroesophageal reflux disease)   . H/O eating disorder   . H/O transfusion    [facility], blood type O+  . History of chicken pox   . History of hiatal hernia   . History of measles, mumps, or rubella   . HOH (hard of hearing)   . Hyperlipidemia   . Hypertension   . Irregular heartbeat   . Moderate COPD (chronic obstructive pulmonary disease) (HCC) 12/14/2014  . Neuropathy   . Opiate use 11/16/2015  . Orthopnea   . Pes planus of both feet 11/16/2015  . Plantar fasciitis of right foot 11/16/2015  . Wheezing     Past Surgical History:  Procedure Laterality Date  . ABDOMINAL HYSTERECTOMY    . CATARACT EXTRACTION W/PHACO Right 05/30/2015   Procedure:  CATARACT EXTRACTION PHACO AND INTRAOCULAR LENS PLACEMENT (IOC);  Surgeon: Galen ManilaWilliam Porfilio, MD;  Location: [facility] ORS;  Service: Ophthalmology;  Laterality: Right;  [place] 00:57 AP% 20.9 CDE 11.99 fluid pack lot #[id number]  . CHOLECYSTECTOMY    . COLON SURGERY     blocked colon  . FOOT FUSION Right 2018  . GALLBLADDER SURGERY    . GASTRIC BYPASS  2010  . internal bleeding  2016  . MOUTH SURGERY    . OVARY SURGERY    . SKIN GRAFT Right 2018   RT foot  . TENOTOMY ACHILLES TENDON     Percuntaneous  . TONSILLECTOMY      There were no vitals filed for this visit.  Subjective Assessment - 12/18/17 1138    Subjective  Patient reports she feels right LE fatigued from leg press exercise the other day and she feels her right LE "giving way" due to weakness/fatigue since then.     Pertinent History  chronic back pain and foot pain over many years since 1995 with previous physical therapy with good results. Patient reports falling 06/02/17 which caused increased back pain and she is now waing to have evaluation by spine specialist  Limitations  Sitting;Standing;Walking;House hold activities    How long can you sit comfortably?  <1 hour    How long can you stand comfortably?  <20 min    How long can you walk comfortably?  only short distances    Patient Stated Goals  Patient wants to be abe to walk more normally with less pain in back to return to normal activities     Currently in Pain?  Yes    Pain Score  4     Pain Location  Back    Pain Orientation  Lower;Left;Right    Pain Descriptors / Indicators  Aching;Spasm;Sore    Pain Type  Chronic pain    Pain Onset  More than a month ago    Pain Frequency  Intermittent          Objective:  Palpation: spasmsand hypersensitive lower lumbar spine left side > right Gait: ambulating independently with cane; antalgic pattern; decreased weight shift to right LE; decreased scissoring right LE during swign through   Treatment: Manual  Therapy:7min. Goal: spasm, pain STM performed to backthoracic to lumbar regionwithconcentration onlumbar region right and leftregion superficial techniques seated in massage chair: patient transfers on and off massage chair with close supervision and assistance of therapist to stabilize chair   Therapeutic exercise: patient performed with demonstration, verbal cues and assistance of therapist: goal: strenth, improved endurance, function  Straight arm pull downs withblueresistive bandx 15 single arm rows doubled blue resistive bandx 15reps palloff press 25x to abdomen Bilateral scapular rows double blue resistive band x 15 reps  Patient was escorted to her car with supervision for safety at end of session  Patient response to treatment:Improved soft tissue elasticity with decreased tenderness 30% folloiwng STM. Patient required minimal verbal cuing for improved technique with exercises        PT Education - 12/18/17 1200    Education provided  Yes    Education Details  exercise instruction     Person(s) Educated  Patient    Methods  Explanation;Demonstration;Verbal cues    Comprehension  Verbalized understanding;Returned demonstration;Verbal cues required          PT Long Term Goals - 11/19/17 1200      PT LONG TERM GOAL #1   Title  Patient will improve MODI score to 40% or better demonstrating improved function with daily tasks and able to stand, sit and walk with less difficulty    Baseline  MODI 55%; 45% 09/09/17; 10/23/17 40%    Status  Achieved      PT LONG TERM GOAL #2   Title  Patient will ben independent with home program for pain control and progressive exercises for flexibility and strength in lumbar spine/right LE  in order to transition to home program     Baseline  limited knowldge of appropriate exercises and progression without cuing, instruction    Status  On-going    Target Date  12/18/17      PT LONG TERM GOAL #3   Title  Patient  will improve MODI score to 30% or better demonstrating improved function with daily tasks and able to stand, sit and walk with less difficulty    Baseline  MODI 55%; 40% 10/23/17    Status  On-going    Target Date  12/18/17            Plan - 12/18/17 1222    Clinical Impression Statement  Patient demonstrates steady progress with decreasing pain in her back and is  improving function with daily activities. She continues with decreased strength and pain in back and both knees     Rehab Potential  Fair    Clinical Impairments Affecting Rehab Potential  (+) motivated (-) muliple co morbidities, chronic condition    PT Frequency  2x / week    PT Duration  8 weeks    PT Treatment/Interventions  Electrical Stimulation;Moist Heat;Ultrasound;Patient/family education;Neuromuscular re-education;Therapeutic exercise;Manual techniques    PT Next Visit Plan  pain control, ther. ex, manual therapy    PT Home Exercise Plan  stabilization exercises; observe good body mechanics for daily tasks       Patient will benefit from skilled therapeutic intervention in order to improve the following deficits and impairments:  Decreased strength, Impaired flexibility, Decreased activity tolerance, Impaired perceived functional ability, Pain, Decreased endurance, Difficulty walking  Visit Diagnosis: Chronic bilateral low back pain with right-sided sciatica  Muscle weakness (generalized)  Other muscle spasm     Problem List Patient Active Problem List   Diagnosis Date Noted  . Left atrial dilatation 12/12/2017  . Cardiac murmur 12/12/2017  . Gastrojejunal ulcer 12/04/2017  . Hyperphosphatemia 12/04/2017  . Vaginitis 12/04/2017  . Spinal stenosis of lumbar region 06/18/2017  . Degeneration of lumbar intervertebral disc 06/02/2017  . Assistance needed with transportation 05/26/2017  . Financial difficulties 05/26/2017  . Needs assistance with community resources 05/26/2017  . Moderate recurrent major  depression (HCC) 05/26/2017  . Non-healing wound of lower extremity 02/06/2017  . Status post ankle arthrodesis 12/11/2016  . Controlled substance agreement broken 10/11/2016  . Low back pain 09/25/2016  . Osteoarthritis of right subtalar joint 07/11/2016  . Posterior tibial tendinitis of right lower extremity 07/11/2016  . Breast cancer screening 06/18/2016  . Knee pain 04/03/2016  . Hyponatremia 03/01/2016  . High triglycerides 12/24/2015  . Pes planus of both feet 11/16/2015  . Plantar fasciitis of right foot 11/16/2015  . Heel spur 11/16/2015  . Diabetic neuropathy (HCC) 11/16/2015  . Right ankle pain 11/16/2015  . Medication monitoring encounter 11/16/2015  . Diabetes mellitus without complication (HCC) 08/15/2015  . Chronic pain of multiple joints 08/15/2015  . Abnormal mammogram of right breast 06/19/2015  . Cerumen impaction 03/16/2015  . Hypertension goal BP (blood pressure) < 140/90 12/14/2014  . Hyperlipidemia LDL goal <100 12/14/2014  . History of transfusion of packed RBC 12/14/2014  . COPD, mild (HCC) 12/14/2014  . Hx of smoking 12/14/2014  . Status post bariatric surgery 12/14/2014  . Morbid obesity (HCC) 12/14/2014  . Chronic radicular lumbar pain 12/14/2014  . History of GI bleed 11/12/2014  . GERD without esophagitis 11/12/2014  . History of small bowel obstruction 09/07/2014    Beacher May PT 12/18/2017, 9:03 PM  [place] [facility] PHYSICAL AND SPORTS MEDICINE [address], [place], [address] Phone: [phone]   Fax:  [phone]  Name: Andrea Holden MRN: [id number] Date of Birth: [birth year]

## 2017-12-22 ENCOUNTER — Encounter: Payer: Self-pay | Admitting: Physical Therapy

## 2017-12-22 ENCOUNTER — Encounter: Payer: Self-pay | Admitting: Family Medicine

## 2017-12-22 ENCOUNTER — Ambulatory Visit: Payer: Medicare HMO | Admitting: Physical Therapy

## 2017-12-22 ENCOUNTER — Encounter: Payer: PPO | Admitting: Physical Therapy

## 2017-12-22 DIAGNOSIS — M6281 Muscle weakness (generalized): Secondary | ICD-10-CM | POA: Diagnosis not present

## 2017-12-22 DIAGNOSIS — M62838 Other muscle spasm: Secondary | ICD-10-CM | POA: Diagnosis not present

## 2017-12-22 DIAGNOSIS — M5441 Lumbago with sciatica, right side: Principal | ICD-10-CM

## 2017-12-22 DIAGNOSIS — M5440 Lumbago with sciatica, unspecified side: Secondary | ICD-10-CM | POA: Diagnosis not present

## 2017-12-22 DIAGNOSIS — G8929 Other chronic pain: Secondary | ICD-10-CM | POA: Diagnosis not present

## 2017-12-22 NOTE — Therapy (Signed)
[place] [facility] PHYSICAL AND SPORTS MEDICINE [address] [place], [address] Phone: [phone]   Fax:  [phone]  Physical Therapy Treatment  Patient Details  Name: Andrea Holden MRN: [id number] Date of Birth: 1945-10-16 Referring Provider: Marene Lenz DO   Encounter Date: 12/22/2017  PT End of Session - 12/22/17 1153    Visit Number  39    Number of Visits  50    Date for PT Re-Evaluation  02/02/18    Authorization Type   9 of 10 (progress)    PT Start Time  1112    PT Stop Time  1146    PT Time Calculation (min)  34 min    Activity Tolerance  Patient tolerated treatment well    Behavior During Therapy  [facility] for tasks assessed/performed       Past Medical History:  Diagnosis Date  . Allergy    seasonal allergies  . Anemia   . Anxiety   . Arthritis   . Asthma    allergy induced asthma  . Cardiac murmur 12/12/2017  . Cataract   . Clotting disorder (HCC)    history of blood clots  . Complication of anesthesia    arrhythmia following colonoscopy  . Degenerative disc disease, lumbar   . Degenerative disc disease, lumbar   . Depression   . Diabetes mellitus   . Diabetic neuropathy (HCC) 11/16/2015  . Dysrhythmia   . GERD (gastroesophageal reflux disease)   . H/O eating disorder   . H/O transfusion    patient was given 5 units of blood while at [facility], blood type O+  . History of chicken pox   . History of hiatal hernia   . History of measles, mumps, or rubella   . HOH (hard of hearing)   . Hyperlipidemia   . Hypertension   . Irregular heartbeat   . Moderate COPD (chronic obstructive pulmonary disease) (HCC) 12/14/2014  . Neuropathy   . Opiate use 11/16/2015  . Orthopnea   . Pes planus of both feet 11/16/2015  . Plantar fasciitis of right foot 11/16/2015  . Wheezing     Past Surgical History:  Procedure Laterality Date  . ABDOMINAL HYSTERECTOMY    . CATARACT EXTRACTION W/PHACO Right 05/30/2015   Procedure:  CATARACT EXTRACTION PHACO AND INTRAOCULAR LENS PLACEMENT (IOC);  Surgeon: Galen Manila, MD;  Location: [facility] ORS;  Service: Ophthalmology;  Laterality: Right;  [place] 00:57 AP% 20.9 CDE 11.99 fluid pack lot #[id number]  . CHOLECYSTECTOMY    . COLON SURGERY     blocked colon  . FOOT FUSION Right 2018  . GALLBLADDER SURGERY    . GASTRIC BYPASS  2010  . internal bleeding  2016  . MOUTH SURGERY    . OVARY SURGERY    . SKIN GRAFT Right 2018   RT foot  . TENOTOMY ACHILLES TENDON     Percuntaneous  . TONSILLECTOMY      There were no vitals filed for this visit.  Subjective Assessment - 12/22/17 1116    Subjective  Patient reports she is still having some problems with giving way of right LE and is going to order a knee support to assist with this.     Pertinent History  chronic back pain and foot pain over many years since 1995 with previous physical therapy with good results. Patient reports falling 06/02/17 which caused increased back pain and she is now waing to have evaluation by spine specialist  Limitations  Sitting;Standing;Walking;House hold activities    How long can you sit comfortably?  <1 hour    How long can you stand comfortably?  <20 min    How long can you walk comfortably?  only short distances    Patient Stated Goals  Patient wants to be abe to walk more normally with less pain in back to return to normal activities     Currently in Pain?  Yes    Pain Score  4     Pain Location  Back    Pain Orientation  Lower;Left;Right    Pain Descriptors / Indicators  Aching;Spasm;Sore    Pain Type  Chronic pain    Pain Onset  More than a month ago    Pain Frequency  Intermittent         [facility] PT Assessment - 12/22/17 0001      Assessment   Medical Diagnosis  Low back pain M54.5    Referring Provider  Marene Lenz DO    Onset Date/Surgical Date  03/31/17          Objective:  Palpation: mild spasms and hypersensitive lower lumbar spine left side  and along thoracic  spine bilaterally Gait: ambulating independently with cane; antalgic pattern; decreased weight shift to right LE MODI: 35%    Treatment: Manual Therapy: 10 min. Goal: spasm, pain STM performed to back thoracic to lumbar region with concentration on lumbar region right  and left region superficial techniques seated in massage chair: patient transfers on and off massage chair with close supervision and assistance of therapist to stabilize chair    Therapeutic exercise: patient performed with demonstration, verbal cues and assistance of therapist: goal: strenth, improved endurance, function   siting in chair: 3# weight up and overhead x 15 3# weight for trunk rotations x 15 Straight arm pull downs with blue resistive band x 15 palloff press  25x to abdomen Bilateral scapular rows double blue resistive band x 15 reps LE's: knee extension tap balance stones x 25 with 3# ankle weights knee flexion with assistance blue resistive band x 15-20 reps each LE hip abduction with blue resistive band x 25 reps hip flexion x 15 reps each LE    Patient was escorted to her car with supervision for safety at end of session   Patient response to treatment: patient demonstrated improved soft tissue elasticity with decreased tenderness and spasms by 30% following treatment       PT Education - 12/22/17 1230    Education provided  Yes    Education Details  exercise instruction; 3# weights for biceps curls, overhead raise and trunk rotation     Person(s) Educated  Patient    Methods  Explanation;Demonstration;Verbal cues    Comprehension  Verbalized understanding;Verbal cues required;Returned demonstration          PT Long Term Goals - 12/22/17 1253      PT LONG TERM GOAL #1   Title  Patient will improve MODI score to 40% or better demonstrating improved function with daily tasks and able to stand, sit and walk with less difficulty    Baseline  MODI 55%; 45% 09/09/17; 10/23/17 40%    Status   Achieved      PT LONG TERM GOAL #2   Title  Patient will ben independent with home program for pain control and progressive exercises for flexibility and strength in lumbar spine/right LE  in order to transition to home program     Baseline  limited knowldge of appropriate exercises and progression without cuing, instruction    Status  On-going    Target Date  02/02/18      PT LONG TERM GOAL #3   Title  Patient will improve MODI score to 25% or better demonstrating improved function with daily tasks and able to stand, sit and walk with less difficulty    Baseline  MODI 55%; 40% 10/23/17; 12/22/2017 35%    Status  Revised    Target Date  02/02/18            Plan - 12/22/17 1232    Clinical Impression Statement  Limited session due to patient arriving 10 min late for therapy session. She continues to demonstrate steady improvement with strength, endurance which carries over into improved function with daily activities at home. She continues with intermittent exacerbations of pain in lower back and thoracic spine and is having right knee symptoms with reports of feeling of knee "giving way" intermittently with walking. She will benefit  from additional physical therapy intervention to further decrease pain and improve function with daily tasks.      Rehab Potential  Fair    Clinical Impairments Affecting Rehab Potential  (+) motivated (-) muliple co morbidities, chronic condition    PT Frequency  2x / week    PT Duration  8 weeks    PT Treatment/Interventions  Electrical Stimulation;Moist Heat;Ultrasound;Patient/family education;Neuromuscular re-education;Therapeutic exercise;Manual techniques    PT Next Visit Plan  pain control, ther. ex, manual therapy    PT Home Exercise Plan  stabilization exercises; observe good body mechanics for daily tasks       Patient will benefit from skilled therapeutic intervention in order to improve the following deficits and impairments:  Decreased  strength, Impaired flexibility, Decreased activity tolerance, Impaired perceived functional ability, Pain, Decreased endurance, Difficulty walking  Visit Diagnosis: Chronic bilateral low back pain with right-sided sciatica - Plan: PT plan of care cert/re-cert  Muscle weakness (generalized) - Plan: PT plan of care cert/re-cert  Other muscle spasm - Plan: PT plan of care cert/re-cert     Problem List Patient Active Problem List   Diagnosis Date Noted  . Left atrial dilatation 12/12/2017  . Cardiac murmur 12/12/2017  . Gastrojejunal ulcer 12/04/2017  . Hyperphosphatemia 12/04/2017  . Vaginitis 12/04/2017  . Spinal stenosis of lumbar region 06/18/2017  . Degeneration of lumbar intervertebral disc 06/02/2017  . Assistance needed with transportation 05/26/2017  . Financial difficulties 05/26/2017  . Needs assistance with community resources 05/26/2017  . Moderate recurrent major depression (HCC) 05/26/2017  . Non-healing wound of lower extremity 02/06/2017  . Status post ankle arthrodesis 12/11/2016  . Controlled substance agreement broken 10/11/2016  . Low back pain 09/25/2016  . Osteoarthritis of right subtalar joint 07/11/2016  . Posterior tibial tendinitis of right lower extremity 07/11/2016  . Breast cancer screening 06/18/2016  . Knee pain 04/03/2016  . Hyponatremia 03/01/2016  . High triglycerides 12/24/2015  . Pes planus of both feet 11/16/2015  . Plantar fasciitis of right foot 11/16/2015  . Heel spur 11/16/2015  . Diabetic neuropathy (HCC) 11/16/2015  . Right ankle pain 11/16/2015  . Medication monitoring encounter 11/16/2015  . Diabetes mellitus without complication (HCC) 08/15/2015  . Chronic pain of multiple joints 08/15/2015  . Abnormal mammogram of right breast 06/19/2015  . Cerumen impaction 03/16/2015  . Hypertension goal BP (blood pressure) < 140/90 12/14/2014  . Hyperlipidemia LDL goal <100 12/14/2014  . History of transfusion of packed RBC 12/14/2014  .  COPD, mild (HCC) 12/14/2014  . Hx of smoking 12/14/2014  . Status post bariatric surgery 12/14/2014  . Morbid obesity (HCC) 12/14/2014  . Chronic radicular lumbar pain 12/14/2014  . History of GI bleed 11/12/2014  . GERD without esophagitis 11/12/2014  . History of small bowel obstruction 09/07/2014    Beacher May PT 12/22/2017, 10:43 PM  [facility] PHYSICAL AND SPORTS MEDICINE [address] [place], [address] Phone: [phone]   Fax:  [phone]  Name: WANA MOUNT MRN: [id number] Date of Birth: [birth year]

## 2017-12-23 ENCOUNTER — Encounter: Payer: Medicare HMO | Attending: Family Medicine | Admitting: Dietician

## 2017-12-23 ENCOUNTER — Encounter: Payer: Self-pay | Admitting: Dietician

## 2017-12-23 ENCOUNTER — Encounter: Payer: PPO | Admitting: Physical Therapy

## 2017-12-23 VITALS — Ht 62.0 in | Wt 217.7 lb

## 2017-12-23 DIAGNOSIS — E6609 Other obesity due to excess calories: Secondary | ICD-10-CM

## 2017-12-23 DIAGNOSIS — Z6838 Body mass index (BMI) 38.0-38.9, adult: Secondary | ICD-10-CM

## 2017-12-23 DIAGNOSIS — E119 Type 2 diabetes mellitus without complications: Secondary | ICD-10-CM | POA: Diagnosis not present

## 2017-12-23 DIAGNOSIS — Z713 Dietary counseling and surveillance: Secondary | ICD-10-CM | POA: Diagnosis not present

## 2017-12-23 NOTE — Progress Notes (Signed)
Medical Nutrition Therapy: Visit start time: 1030  end time: 1130  Assessment:  Diagnosis: Diabetes, obesity Medical history changes: no changes Psychosocial issues/ stress concerns: family stress  Current weight: 217.7lbs Height: 5'2 Medications, supplement changes: no changes per patient  Progress and evaluation: Weight gain of 0.9lbs since 11/05/17. Patient reports eating "Movie Theater Butter"  Popcorn with extra butter added nightly. She drinks multiple cups of coffee daily and inadvertently ended up with regular vs sugar free creamer recently. She plans to begin estimating calories in her meals and snacks on suggestion from Dr. Sherie DonLada.  Physical activity: Physical therapy 2x a week  Dietary Intake:  Usual eating pattern includes 3 meals and 1-3 snacks per day. Dining out frequency: not assessed today.   Nutrition Care Education: Topics covered: weight control     Weight control: benefits of tracking food intake and options for estimating calories; practiced adding calories using patient's food diary and CalorieKing website. Reviewed daily calorie needs. Other: Discussed blood lipid levels and goals per patient question.    Nutritional Diagnosis:  [place]-2.2 Altered nutrition-related laboratory As related to diabetes.  As evidenced by patient with HbA1C of 6.8%. Renovo-3.3 Overweight/obesity As related to excess calories and limited physical activity due to pain.  As evidenced by pateitn with BMI of 39.8, and patient dietary recall..  Intervention: Instruction as noted above.   Updated goals with input from patient   Patient voices better understanding of sources of extra calories in her diet.   Education Materials given:  Marland Kitchen. Goals/ instructions  Learner/ who was taught:  . Patient   Level of understanding: Marland Kitchen. Verbalizes/ demonstrates competency   Demonstrated degree of understanding via:   Teach back Learning barriers: . None  Willingness to learn/ readiness for change: . Eager,  change in progress   Monitoring and Evaluation:  Dietary intake, exercise, BG control, and body weight      follow up: 01/23/18

## 2017-12-23 NOTE — Patient Instructions (Signed)
   Reduce intake of popcorn, and/or butter on popcorn. Find a lower calorie alternative, or other activity to occupy hands in the evening.  Resume sugar free lowfat coffee creamer when able to.  Aids to reduce gas can include simethicone (Gas-x), or try Beano to help in breaking down gas-forming substances in some foods.   Use the Calorieking.com website to look up calories in foods when there is no label available.

## 2017-12-24 ENCOUNTER — Encounter: Payer: Self-pay | Admitting: Family Medicine

## 2017-12-24 DIAGNOSIS — I350 Nonrheumatic aortic (valve) stenosis: Secondary | ICD-10-CM

## 2017-12-24 DIAGNOSIS — I517 Cardiomegaly: Secondary | ICD-10-CM | POA: Insufficient documentation

## 2017-12-24 DIAGNOSIS — I119 Hypertensive heart disease without heart failure: Secondary | ICD-10-CM | POA: Insufficient documentation

## 2017-12-24 HISTORY — DX: Nonrheumatic aortic (valve) stenosis: I35.0

## 2017-12-24 HISTORY — DX: Cardiomegaly: I51.7

## 2017-12-24 NOTE — Assessment & Plan Note (Signed)
Seeing cardiologist 

## 2017-12-24 NOTE — Assessment & Plan Note (Signed)
Weight loss and BP control key; seeing cardiologist

## 2017-12-24 NOTE — Assessment & Plan Note (Signed)
LVH present on echocardiogram; weight loss important; control BP

## 2017-12-25 ENCOUNTER — Encounter: Payer: PPO | Admitting: Physical Therapy

## 2017-12-25 ENCOUNTER — Ambulatory Visit: Payer: Medicare HMO | Admitting: Physical Therapy

## 2017-12-25 ENCOUNTER — Telehealth: Payer: Self-pay

## 2017-12-25 ENCOUNTER — Encounter: Payer: Self-pay | Admitting: Physical Therapy

## 2017-12-25 DIAGNOSIS — G8929 Other chronic pain: Secondary | ICD-10-CM | POA: Diagnosis not present

## 2017-12-25 DIAGNOSIS — M62838 Other muscle spasm: Secondary | ICD-10-CM

## 2017-12-25 DIAGNOSIS — M6281 Muscle weakness (generalized): Secondary | ICD-10-CM | POA: Diagnosis not present

## 2017-12-25 DIAGNOSIS — M5440 Lumbago with sciatica, unspecified side: Secondary | ICD-10-CM | POA: Diagnosis not present

## 2017-12-25 DIAGNOSIS — M5441 Lumbago with sciatica, right side: Secondary | ICD-10-CM | POA: Diagnosis not present

## 2017-12-25 MED ORDER — ATORVASTATIN CALCIUM 10 MG PO TABS: 10.0000 mg | ORAL_TABLET | Freq: Every day | ORAL | 1 refills | Status: DC

## 2017-12-25 NOTE — Telephone Encounter (Signed)
Start statin.  

## 2017-12-25 NOTE — Therapy (Signed)
[place] [facility] PHYS[facility] SPORTS MEDICINE [address] [place], [address] Phone: [phone]   Fax:  [phone]  Physical Therapy Treatment Physical Therapy Progress Note  Dates of reporting period 11/25/2017  to  12/25/2017     Patient Details  Name: Andrea Holden MRN: [id number] Date of Birth: 05/04/1946 Referring Provider: Marene Lenz DO   Encounter Date: 12/25/2017      PT End of Session - 12/25/17 1141    Visit Number  40    Number of Visits  50    Date for PT Re-Evaluation  02/02/18    Authorization Type   10 of 10 (progress)    PT Start Time  1125    PT Stop Time  1214    PT Time Calculation (min)  49 min    Activity Tolerance  Patient tolerated treatment well    Behavior During Therapy  [facility] for tasks assessed/performed       Past Medical History:  Diagnosis Date  . Allergy    seasonal allergies  . Anemia   . Anxiety   . Aortic stenosis 12/24/2017   Echo Aug 2018  . Arthritis   . Asthma    allergy induced asthma  . Cardiac murmur 12/12/2017  . Cataract   . Clotting disorder (HCC)    history of blood clots  . Complication of anesthesia    arrhythmia following colonoscopy  . Degenerative disc disease, lumbar   . Degenerative disc disease, lumbar   . Depression   . Diabetes mellitus   . Diabetic neuropathy (HCC) 11/16/2015  . Dysrhythmia   . GERD (gastroesophageal reflux disease)   . H/O eating disorder   . H/O transfusion    patient was given 5 units of blood while at [facility], blood type O+  . History of chicken pox   . History of hiatal hernia   . History of measles, mumps, or rubella   . HOH (hard of hearing)   . Hyperlipidemia   . Hypertension   . Irregular heartbeat   . LVH (left ventricular hypertrophy) 12/24/2017   Echo Aug 2018  . Moderate COPD (chronic obstructive pulmonary disease) (HCC) 12/14/2014  . Neuropathy   . Opiate use 11/16/2015  . Orthopnea   . Pes planus of both feet  11/16/2015  . Plantar fasciitis of right foot 11/16/2015  . Wheezing     Past Surgical History:  Procedure Laterality Date  . ABDOMINAL HYSTERECTOMY    . CATARACT EXTRACTION W/PHACO Right 05/30/2015   Procedure: CATARACT EXTRACTION PHACO AND INTRAOCULAR LENS PLACEMENT (IOC);  Surgeon: Galen Manila, MD;  Location: [facility] ORS;  Service: Ophthalmology;  Laterality: Right;  [place] 00:57 AP% 20.9 CDE 11.99 fluid pack lot #[id number]  . CHOLECYSTECTOMY    . COLON SURGERY     blocked colon  . FOOT FUSION Right 2018  . GALLBLADDER SURGERY    . GASTRIC BYPASS  2010  . internal bleeding  2016  . MOUTH SURGERY    . OVARY SURGERY    . SKIN GRAFT Right 2018   RT foot  . TENOTOMY ACHILLES TENDON     Percuntaneous  . TONSILLECTOMY      There were no vitals filed for this visit.  Subjective Assessment - 12/25/17 1138    Subjective  Patinet reports her right knee is still catching and gives way intermittently without warning.  She continues to see progress with back pain decreasing with treatment and she feels she  is not able to exercise as well at home by herself and requires assistance to perform exercises correctly.   Pertinent History  chronic back pain and foot pain over many years since 1995 with previous physical therapy with good results. Patient reports falling 06/02/17 which caused increased back pain and she is now waing to have evaluation by spine specialist    Limitations  Sitting;Standing;Walking;House hold activities    How long can you sit comfortably?  <1 hour    How long can you stand comfortably?  <20 min    How long can you walk comfortably?  only short distances    Patient Stated Goals  Patient wants to be abe to walk more normally with less pain in back to return to normal activities     Currently in Pain?  Yes    Pain Score  4     Pain Location  Back    Pain Orientation  Left;Lower    Pain Descriptors / Indicators  Aching;Sore;Spasm    Pain Type  Chronic pain    Pain Onset   More than a month ago    Pain Frequency  Intermittent        Objective:  Palpation: mild spasms and hypersensitive lower lumbar spine left side  and along thoracic spine bilaterally Gait: ambulating independently with cane; antalgic pattern; decreased weight shift to right LE Outcome measure: from 12/22/17:MODI: 35%    Treatment: Manual Therapy: 15 min. Goal: spasm, pain STM performed to back thoracic to lumbar region with concentration on lumbar region right  and left region superficial techniques seated in massage chair: patient transfers on and off massage chair with close supervision and assistance of therapist to stabilize chair    Therapeutic exercise: patient performed with demonstration, verbal cues and assistance of therapist: goal: strenth, improved endurance, function   siting in chair: 3# weight up and overhead x 15 3# weight for trunk rotations x 25 Straight arm pull downs with blue resistive band x 15 palloff press  25x to abdomen Biceps curls 3# weights x 15 reps  LE's: knee extension tap balance stones x 25 with 3# ankle weights knee flexion with assistance blue resistive band x 15-20 reps each LE hip abduction with blue resistive band x 25 reps   Patient was escorted to her car with supervision for safety at end of session   Patient response to treatment: patient demonstrated improved soft tissue elasticity with decreased tenderness and spasms by 30% following treatment. Patient required repeated cues and demonstration to perform exercises with correct positioning, technique. Pain level improved from 4/10 to < 3/10 following treatment.          PT Education - 12/25/17 1141    Education provided  Yes    Education Details  exercise instruction    Person(s) Educated  Patient    Methods  Explanation;Demonstration;Verbal cues    Comprehension  Verbalized understanding;Returned demonstration;Verbal cues required          PT Long Term Goals - 12/22/17 1253       PT LONG TERM GOAL #1   Title  Patient will improve MODI score to 40% or better demonstrating improved function with daily tasks and able to stand, sit and walk with less difficulty    Baseline  MODI 55%; 45% 09/09/17; 10/23/17 40%    Status  Achieved      PT LONG TERM GOAL #2   Title  Patient will ben independent with home program for pain control  and progressive exercises for flexibility and strength in lumbar spine/right LE  in order to transition to home program     Baseline  limited knowldge of appropriate exercises and progression without cuing, instruction    Status  On-going    Target Date  02/02/18      PT LONG TERM GOAL #3   Title  Patient will improve MODI score to 25% or better demonstrating improved function with daily tasks and able to stand, sit and walk with less difficulty    Baseline  MODI 55%; 40% 10/23/17; 12/22/2017 35%    Status  Revised    Target Date  02/02/18            Plan - 12/25/17 1221    Clinical Impression Statement  Patient is progressing well towards goals. She has MODI score of 35% which has improved from intitial score of 55%. She continues with decreased strength, endurance and is having right LE knee pain and sympotms of "giving way" which is limiting progress. She will benefit from continued physical therapy intervention to address limitations and achieve maximal function.     Rehab Potential  Fair    Clinical Impairments Affecting Rehab Potential  (+) motivated (-) muliple co morbidities, chronic condition    PT Frequency  2x / week    PT Duration  6 weeks    PT Treatment/Interventions  Electrical Stimulation;Moist Heat;Ultrasound;Patient/family education;Neuromuscular re-education;Therapeutic exercise;Manual techniques    PT Next Visit Plan  pain control, ther. ex, manual therapy    PT Home Exercise Plan  stabilization exercises; observe good body mechanics for daily tasks       Patient will benefit from skilled therapeutic intervention in  order to improve the following deficits and impairments:  Decreased strength, Impaired flexibility, Decreased activity tolerance, Impaired perceived functional ability, Pain, Decreased endurance, Difficulty walking  Visit Diagnosis: Chronic bilateral low back pain with right-sided sciatica  Muscle weakness (generalized)  Other muscle spasm     Problem List Patient Active Problem List   Diagnosis Date Noted  . Aortic stenosis 12/24/2017  . LVH (left ventricular hypertrophy) 12/24/2017  . Hypertension with heart disease 12/24/2017  . Left atrial dilatation 12/12/2017  . Cardiac murmur 12/12/2017  . Gastrojejunal ulcer 12/04/2017  . Hyperphosphatemia 12/04/2017  . Vaginitis 12/04/2017  . Spinal stenosis of lumbar region 06/18/2017  . Degeneration of lumbar intervertebral disc 06/02/2017  . Assistance needed with transportation 05/26/2017  . Financial difficulties 05/26/2017  . Needs assistance with community resources 05/26/2017  . Moderate recurrent major depression (HCC) 05/26/2017  . Non-healing wound of lower extremity 02/06/2017  . Status post ankle arthrodesis 12/11/2016  . Controlled substance agreement broken 10/11/2016  . Low back pain 09/25/2016  . Osteoarthritis of right subtalar joint 07/11/2016  . Posterior tibial tendinitis of right lower extremity 07/11/2016  . Breast cancer screening 06/18/2016  . Knee pain 04/03/2016  . Hyponatremia 03/01/2016  . High triglycerides 12/24/2015  . Pes planus of both feet 11/16/2015  . Plantar fasciitis of right foot 11/16/2015  . Heel spur 11/16/2015  . Diabetic neuropathy (HCC) 11/16/2015  . Right ankle pain 11/16/2015  . Medication monitoring encounter 11/16/2015  . Chronic pain of multiple joints 08/15/2015  . Abnormal mammogram of right breast 06/19/2015  . Cerumen impaction 03/16/2015  . Hyperlipidemia LDL goal <100 12/14/2014  . History of transfusion of packed RBC 12/14/2014  . COPD, mild (HCC) 12/14/2014  . Hx of  smoking 12/14/2014  . Status post bariatric surgery 12/14/2014  .  Morbid obesity (HCC) 12/14/2014  . Chronic radicular lumbar pain 12/14/2014  . History of GI bleed 11/12/2014  . GERD without esophagitis 11/12/2014  . History of small bowel obstruction 09/07/2014    Beacher May PT 12/26/2017, 9:10 AM  [place] [facility] PHYSICAL AND SPORTS MEDICINE [address] [place], [address] Phone: [phone]   Fax:  [phone]  Name: Andrea Holden MRN: [id number] Date of Birth: 09-23-1945

## 2017-12-25 NOTE — Telephone Encounter (Signed)
Pt notified about labs, will try statin

## 2017-12-29 ENCOUNTER — Ambulatory Visit: Payer: Medicare HMO | Admitting: Physical Therapy

## 2017-12-31 ENCOUNTER — Encounter: Payer: Self-pay | Admitting: Physical Therapy

## 2017-12-31 ENCOUNTER — Ambulatory Visit: Payer: Medicare HMO | Attending: Physical Medicine and Rehabilitation | Admitting: Physical Therapy

## 2017-12-31 DIAGNOSIS — G8929 Other chronic pain: Secondary | ICD-10-CM | POA: Insufficient documentation

## 2017-12-31 DIAGNOSIS — M6281 Muscle weakness (generalized): Secondary | ICD-10-CM | POA: Insufficient documentation

## 2017-12-31 DIAGNOSIS — M5441 Lumbago with sciatica, right side: Secondary | ICD-10-CM | POA: Insufficient documentation

## 2017-12-31 DIAGNOSIS — M62838 Other muscle spasm: Secondary | ICD-10-CM | POA: Diagnosis not present

## 2017-12-31 NOTE — Therapy (Signed)
[facility] MEDICINE [address] [place], [address] Phone: [phone]   Fax:  [phone]  Physical Therapy Treatment  Patient Details  Name: Andrea Holden MRN: [id number] Date of Birth: [birth year] Referring Provider: Marene Lenz DO   Encounter Date: 12/31/2017  PT End of Session - 12/31/17 1100    Visit Number  41    Number of Visits  50    Date for PT Re-Evaluation  02/02/18    Authorization Type   1 of 10 (progress)    PT Start Time  1026    PT Stop Time  1105    PT Time Calculation (min)  39 min    Activity Tolerance  Patient tolerated treatment well    Behavior During Therapy  [facility] for tasks assessed/performed       Past Medical History:  Diagnosis Date  . Allergy    seasonal allergies  . Anemia   . Anxiety   . Aortic stenosis 12/24/2017   Echo Aug 2018  . Arthritis   . Asthma    allergy induced asthma  . Cardiac murmur 12/12/2017  . Cataract   . Clotting disorder (HCC)    history of blood clots  . Complication of anesthesia    arrhythmia following colonoscopy  . Degenerative disc disease, lumbar   . Degenerative disc disease, lumbar   . Depression   . Diabetes mellitus   . Diabetic neuropathy (HCC) 11/16/2015  . Dysrhythmia   . GERD (gastroesophageal reflux disease)   . H/O eating disorder   . H/O transfusion    patient was given 5 units of blood while at [facility], blood type O+  . History of chicken pox   . History of hiatal hernia   . History of measles, mumps, or rubella   . HOH (hard of hearing)   . Hyperlipidemia   . Hypertension   . Irregular heartbeat   . LVH (left ventricular hypertrophy) 12/24/2017   Echo Aug 2018  . Moderate COPD (chronic obstructive pulmonary disease) (HCC) 12/14/2014  . Neuropathy   . Opiate use 11/16/2015  . Orthopnea   . Pes planus of both feet 11/16/2015  . Plantar fasciitis of right foot 11/16/2015  . Wheezing     Past Surgical History:   Procedure Laterality Date  . ABDOMINAL HYSTERECTOMY    . CATARACT EXTRACTION W/PHACO Right 05/30/2015   Procedure: CATARACT EXTRACTION PHACO AND INTRAOCULAR LENS PLACEMENT (IOC);  Surgeon: Galen Manila, MD;  Location: [facility] ORS;  Service: Ophthalmology;  Laterality: Right;  [place] 00:57 AP% 20.9 CDE 11.99 fluid pack lot #[id number]  . CHOLECYSTECTOMY    . COLON SURGERY     blocked colon  . FOOT FUSION Right 2018  . GALLBLADDER SURGERY    . GASTRIC BYPASS  2010  . internal bleeding  2016  . MOUTH SURGERY    . OVARY SURGERY    . SKIN GRAFT Right 2018   RT foot  . TENOTOMY ACHILLES TENDON     Percuntaneous  . TONSILLECTOMY      There were no vitals filed for this visit.  Subjective Assessment - 12/31/17 1027    Subjective  Patient reports she is doing well with less back pain and is exercising at home as able.     Pertinent History  chronic back pain and foot pain over many years since 1995 with previous physical therapy with good results. Patient reports falling 06/02/17 which caused increased back pain  and she is now waing to have evaluation by spine specialist    Limitations  Sitting;Standing;Walking;House hold activities    How long can you sit comfortably?  <1 hour    How long can you stand comfortably?  <20 min    How long can you walk comfortably?  only short distances    Patient Stated Goals  Patient wants to be abe to walk more normally with less pain in back to return to normal activities     Currently in Pain?  Yes    Pain Score  4     Pain Location  Back    Pain Orientation  Left;Lower    Pain Descriptors / Indicators  Aching;Sore;Spasm    Pain Type  Chronic pain    Pain Onset  More than a month ago    Pain Frequency  Intermittent          Objective:  Palpation: mild spasms palpable lower lumbar spine left side and along thoracic spine right Gait: ambulating independently with cane; antalgic pattern; decreased weight shift to right LE   Treatment: Manual  Therapy: 15 min. Goal: spasm, pain STM performed to back thoracic to lumbar region with concentration on lumbar region right  and left region superficial techniques seated in massage chair: patient transfers on and off massage chair with close supervision and assistance of therapist to stabilize chair    Therapeutic exercise: patient performed with demonstration, verbal cues and assistance of therapist: goal: strenth, improved endurance, function   siting in chair: 3# weight up and overhead x 15 3# weight for trunk rotations x 25 Straight arm pull downs with blue resistive band x 15 palloff press  25x to abdomen Biceps curls 3# weights 2 x 15 reps   LE's: knee flexion with assistance blue resistive band x 15-20 reps each LE hip abduction with blue resistive band x 25 reps hip flexion with blue resistive band x 15 reps each   Patient was escorted to her car with supervision for safety at end of session   Patient response to treatment: improved endurance and improved technique with exercises with minimal ucing and assistance. patient with decreased spasms and improved soft tissue elasticity by 30%. pain improved from 4/10 to < 3/10 following treatment.         PT Education - 12/31/17 1036    Education provided  Yes    Education Details  exercise instruction    Person(s) Educated  Patient    Methods  Explanation;Demonstration;Verbal cues    Comprehension  Verbal cues required;Returned demonstration;Verbalized understanding          PT Long Term Goals - 12/22/17 1253      PT LONG TERM GOAL #1   Title  Patient will improve MODI score to 40% or better demonstrating improved function with daily tasks and able to stand, sit and walk with less difficulty    Baseline  MODI 55%; 45% 09/09/17; 10/23/17 40%    Status  Achieved      PT LONG TERM GOAL #2   Title  Patient will ben independent with home program for pain control and progressive exercises for flexibility and strength in  lumbar spine/right LE  in order to transition to home program     Baseline  limited knowldge of appropriate exercises and progression without cuing, instruction    Status  On-going    Target Date  02/02/18      PT LONG TERM GOAL #3   Title  Patient will improve MODI score to 25% or better demonstrating improved function with daily tasks and able to stand, sit and walk with less difficulty    Baseline  MODI 55%; 40% 10/23/17; 12/22/2017 35%    Status  Revised    Target Date  02/02/18            Plan - 12/31/17 1106    Clinical Impression Statement  Patient demonstrates steady progress with goals with decreasing spasms and progressing with exercises. She continues with pain and decreased strength and endurance and will benefit from continued physical therapy interveniton ot achieve goals.     Rehab Potential  Fair    Clinical Impairments Affecting Rehab Potential  (+) motivated (-) muliple co morbidities, chronic condition    PT Frequency  2x / week    PT Duration  6 weeks    PT Treatment/Interventions  Electrical Stimulation;Moist Heat;Ultrasound;Patient/family education;Neuromuscular re-education;Therapeutic exercise;Manual techniques    PT Next Visit Plan  pain control, ther. ex, manual therapy    PT Home Exercise Plan  stabilization exercises; observe good body mechanics for daily tasks       Patient will benefit from skilled therapeutic intervention in order to improve the following deficits and impairments:  Decreased strength, Impaired flexibility, Decreased activity tolerance, Impaired perceived functional ability, Pain, Decreased endurance, Difficulty walking  Visit Diagnosis: Chronic bilateral low back pain with right-sided sciatica  Muscle weakness (generalized)  Other muscle spasm     Problem List Patient Active Problem List   Diagnosis Date Noted  . Aortic stenosis 12/24/2017  . LVH (left ventricular hypertrophy) 12/24/2017  . Hypertension with heart disease  12/24/2017  . Left atrial dilatation 12/12/2017  . Cardiac murmur 12/12/2017  . Gastrojejunal ulcer 12/04/2017  . Hyperphosphatemia 12/04/2017  . Vaginitis 12/04/2017  . Spinal stenosis of lumbar region 06/18/2017  . Degeneration of lumbar intervertebral disc 06/02/2017  . Assistance needed with transportation 05/26/2017  . Financial difficulties 05/26/2017  . Needs assistance with community resources 05/26/2017  . Moderate recurrent major depression (HCC) 05/26/2017  . Non-healing wound of lower extremity 02/06/2017  . Status post ankle arthrodesis 12/11/2016  . Controlled substance agreement broken 10/11/2016  . Low back pain 09/25/2016  . Osteoarthritis of right subtalar joint 07/11/2016  . Posterior tibial tendinitis of right lower extremity 07/11/2016  . Breast cancer screening 06/18/2016  . Knee pain 04/03/2016  . Hyponatremia 03/01/2016  . High triglycerides 12/24/2015  . Pes planus of both feet 11/16/2015  . Plantar fasciitis of right foot 11/16/2015  . Heel spur 11/16/2015  . Diabetic neuropathy (HCC) 11/16/2015  . Right ankle pain 11/16/2015  . Medication monitoring encounter 11/16/2015  . Chronic pain of multiple joints 08/15/2015  . Abnormal mammogram of right breast 06/19/2015  . Cerumen impaction 03/16/2015  . Hyperlipidemia LDL goal <100 12/14/2014  . History of transfusion of packed RBC 12/14/2014  . COPD, mild (HCC) 12/14/2014  . Hx of smoking 12/14/2014  . Status post bariatric surgery 12/14/2014  . Morbid obesity (HCC) 12/14/2014  . Chronic radicular lumbar pain 12/14/2014  . History of GI bleed 11/12/2014  . GERD without esophagitis 11/12/2014  . History of small bowel obstruction 09/07/2014    Beacher May PT 12/31/2017, 10:45 PM  [facility] PHYSICAL AND SPORTS MEDICINE [address], [place], [address] Phone: [phone]   Fax:  [phone]  Name: Andrea Holden MRN: [id number] Date of Birth:  17-Dec-1945

## 2018-01-05 ENCOUNTER — Telehealth: Payer: Self-pay | Admitting: Gastroenterology

## 2018-01-05 ENCOUNTER — Encounter: Payer: Self-pay | Admitting: Physical Therapy

## 2018-01-05 ENCOUNTER — Ambulatory Visit: Payer: Medicare HMO | Admitting: Physical Therapy

## 2018-01-05 DIAGNOSIS — M6281 Muscle weakness (generalized): Secondary | ICD-10-CM

## 2018-01-05 DIAGNOSIS — M5441 Lumbago with sciatica, right side: Principal | ICD-10-CM

## 2018-01-05 DIAGNOSIS — G8929 Other chronic pain: Secondary | ICD-10-CM | POA: Diagnosis not present

## 2018-01-05 DIAGNOSIS — M62838 Other muscle spasm: Secondary | ICD-10-CM | POA: Diagnosis not present

## 2018-01-05 NOTE — Therapy (Signed)
[facility]TS MEDICINE [address], [place], [address] Phone: [phone]   Fax:  [phone]  Physical Therapy Treatment  Patient Details  Name: Andrea Holden MRN: [id number] Date of Birth: 10-12-1945 Referring Provider: Marene Lenz DO   Encounter Date: 01/05/2018  PT End of Session - 01/05/18 1108    Visit Number  42    Number of Visits  50    Date for PT Re-Evaluation  02/02/18    Authorization Type   2 of 10 (progress)    PT Start Time  1105    PT Stop Time  1146    PT Time Calculation (min)  41 min    Activity Tolerance  Patient tolerated treatment well    Behavior During Therapy  [facility] for tasks assessed/performed       Past Medical History:  Diagnosis Date  . Allergy    seasonal allergies  . Anemia   . Anxiety   . Aortic stenosis 12/24/2017   Echo Aug 2018  . Arthritis   . Asthma    allergy induced asthma  . Cardiac murmur 12/12/2017  . Cataract   . Clotting disorder (HCC)    history of blood clots  . Complication of anesthesia    arrhythmia following colonoscopy  . Degenerative disc disease, lumbar   . Degenerative disc disease, lumbar   . Depression   . Diabetes mellitus   . Diabetic neuropathy (HCC) 11/16/2015  . Dysrhythmia   . GERD (gastroesophageal reflux disease)   . H/O eating disorder   . H/O transfusion    patient was given 5 units of blood while at [facility], blood type O+  . History of chicken pox   . History of hiatal hernia   . History of measles, mumps, or rubella   . HOH (hard of hearing)   . Hyperlipidemia   . Hypertension   . Irregular heartbeat   . LVH (left ventricular hypertrophy) 12/24/2017   Echo Aug 2018  . Moderate COPD (chronic obstructive pulmonary disease) (HCC) 12/14/2014  . Neuropathy   . Opiate use 11/16/2015  . Orthopnea   . Pes planus of both feet 11/16/2015  . Plantar fasciitis of right foot 11/16/2015  . Wheezing     Past Surgical History:   Procedure Laterality Date  . ABDOMINAL HYSTERECTOMY    . CATARACT EXTRACTION W/PHACO Right 05/30/2015   Procedure: CATARACT EXTRACTION PHACO AND INTRAOCULAR LENS PLACEMENT (IOC);  Surgeon: Galen Manila, MD;  Location: [facility] ORS;  Service: Ophthalmology;  Laterality: Right;  [place] 00:57 AP% 20.9 CDE 11.99 fluid pack lot #[id number]  . CHOLECYSTECTOMY    . COLON SURGERY     blocked colon  . FOOT FUSION Right 2018  . GALLBLADDER SURGERY    . GASTRIC BYPASS  2010  . internal bleeding  2016  . MOUTH SURGERY    . OVARY SURGERY    . SKIN GRAFT Right 2018   RT foot  . TENOTOMY ACHILLES TENDON     Percuntaneous  . TONSILLECTOMY      There were no vitals filed for this visit.  Subjective Assessment - 01/05/18 1105    Subjective  Patient reports she is still having pain in right knee that is bothering her with walking. Her lower back on left side     Pertinent History  chronic back pain and foot pain over many years since 1995 with previous physical therapy with good results. Patient reports falling 06/02/17  which caused increased back pain and she is now waing to have evaluation by spine specialist    Limitations  Sitting;Standing;Walking;House hold activities    How long can you sit comfortably?  <1 hour    How long can you stand comfortably?  <20 min    How long can you walk comfortably?  only short distances    Patient Stated Goals  Patient wants to be abe to walk more normally with less pain in back to return to normal activities     Currently in Pain?  Yes    Pain Score  5     Pain Location  Back    Pain Orientation  Left;Lower    Pain Descriptors / Indicators  Aching;Spasm;Sore    Pain Onset  More than a month ago    Pain Frequency  Intermittent          Objective:  Palpation: mild spasms palpable lower lumbar spine bilaterally  Gait: ambulating independently with cane; mild antalgic pattern; decreased weight shift to right LE   Treatment: Manual Therapy: 15 min. Goal:  spasm, pain STM performed to back thoracic to lumbar region with concentration on lumbar region right  and left region superficial techniques seated in massage chair: patient transfers on and off massage chair with close supervision and assistance of therapist to stabilize chair    Therapeutic exercise: patient performed with demonstration, verbal cues and assistance of therapist: goal: strenth, improved endurance, function   siting in chair: 3# weight up and overhead x 15 4# weight for trunk rotations x 25 Straight arm pull downs with blue resistive band x 15 palloff press  25x to abdomen Biceps curls 4# weights  x 15 reps   LE's: knee flexion with assistance blue resistive band 2 x 15-20 reps left LE hip abduction with blue resistive band x 25 reps hip flexion with blue resistive band x 15 reps left LE    Patient was escorted to her car with supervision for safety at end of session   Patient response to treatment: improved soft tissue elasticity by 50% following STM and decreased pain from 5/10 to 3/10 in lower back. right knee not exercised due to request of patient due to pain.        PT Education - 01/05/18 1108    Education provided  Yes    Education Details  exercise instruction    Person(s) Educated  Patient    Methods  Explanation;Demonstration;Verbal cues    Comprehension  Verbalized understanding;Returned demonstration;Verbal cues required          PT Long Term Goals - 12/22/17 1253      PT LONG TERM GOAL #1   Title  Patient will improve MODI score to 40% or better demonstrating improved function with daily tasks and able to stand, sit and walk with less difficulty    Baseline  MODI 55%; 45% 09/09/17; 10/23/17 40%    Status  Achieved      PT LONG TERM GOAL #2   Title  Patient will ben independent with home program for pain control and progressive exercises for flexibility and strength in lumbar spine/right LE  in order to transition to home program     Baseline   limited knowldge of appropriate exercises and progression without cuing, instruction    Status  On-going    Target Date  02/02/18      PT LONG TERM GOAL #3   Title  Patient will improve MODI score to 25%  or better demonstrating improved function with daily tasks and able to stand, sit and walk with less difficulty    Baseline  MODI 55%; 40% 10/23/17; 12/22/2017 35%    Status  Revised    Target Date  02/02/18            Plan - 01/05/18 1109    Clinical Impression Statement  Patient continues to progress slowly with exercises and pain control due to chronic pain, condition and continued right knee pain.  She is progressing with strengthening exercises with improved technique and endurance noted during treatment session. She continues to benefit from physical therapy intervention to address limitations.    Rehab Potential  Fair    Clinical Impairments Affecting Rehab Potential  (+) motivated (-) muliple co morbidities, chronic condition    PT Frequency  2x / week    PT Duration  6 weeks    PT Treatment/Interventions  Electrical Stimulation;Moist Heat;Ultrasound;Patient/family education;Neuromuscular re-education;Therapeutic exercise;Manual techniques    PT Next Visit Plan  pain control, ther. ex, manual therapy    PT Home Exercise Plan  stabilization exercises; observe good body mechanics for daily tasks       Patient will benefit from skilled therapeutic intervention in order to improve the following deficits and impairments:  Decreased strength, Impaired flexibility, Decreased activity tolerance, Impaired perceived functional ability, Pain, Decreased endurance, Difficulty walking  Visit Diagnosis: Chronic bilateral low back pain with right-sided sciatica  Muscle weakness (generalized)  Other muscle spasm     Problem List Patient Active Problem List   Diagnosis Date Noted  . Aortic stenosis 12/24/2017  . LVH (left ventricular hypertrophy) 12/24/2017  . Hypertension with  heart disease 12/24/2017  . Left atrial dilatation 12/12/2017  . Cardiac murmur 12/12/2017  . Gastrojejunal ulcer 12/04/2017  . Hyperphosphatemia 12/04/2017  . Vaginitis 12/04/2017  . Spinal stenosis of lumbar region 06/18/2017  . Degeneration of lumbar intervertebral disc 06/02/2017  . Assistance needed with transportation 05/26/2017  . Financial difficulties 05/26/2017  . Needs assistance with community resources 05/26/2017  . Moderate recurrent major depression (HCC) 05/26/2017  . Non-healing wound of lower extremity 02/06/2017  . Status post ankle arthrodesis 12/11/2016  . Controlled substance agreement broken 10/11/2016  . Low back pain 09/25/2016  . Osteoarthritis of right subtalar joint 07/11/2016  . Posterior tibial tendinitis of right lower extremity 07/11/2016  . Breast cancer screening 06/18/2016  . Knee pain 04/03/2016  . Hyponatremia 03/01/2016  . High triglycerides 12/24/2015  . Pes planus of both feet 11/16/2015  . Plantar fasciitis of right foot 11/16/2015  . Heel spur 11/16/2015  . Diabetic neuropathy (HCC) 11/16/2015  . Right ankle pain 11/16/2015  . Medication monitoring encounter 11/16/2015  . Chronic pain of multiple joints 08/15/2015  . Abnormal mammogram of right breast 06/19/2015  . Cerumen impaction 03/16/2015  . Hyperlipidemia LDL goal <100 12/14/2014  . History of transfusion of packed RBC 12/14/2014  . COPD, mild (HCC) 12/14/2014  . Hx of smoking 12/14/2014  . Status post bariatric surgery 12/14/2014  . Morbid obesity (HCC) 12/14/2014  . Chronic radicular lumbar pain 12/14/2014  . History of GI bleed 11/12/2014  . GERD without esophagitis 11/12/2014  . History of small bowel obstruction 09/07/2014    Beacher MayBrooks, Marie PT 01/05/2018, 12:40 PM  [place] [facility] [address] [place], [place], [address] Phone: [phone]   Fax:  [phone]  Name: Andrea Holden MRN: [id number] Date  of Birth: 06/29/1946

## 2018-01-05 NOTE — Telephone Encounter (Signed)
Tried contacting pt but vm box was full.  

## 2018-01-05 NOTE — Telephone Encounter (Signed)
Pt is scheduled for procedure 01/06/18 and she has not received her instructions  She ate a sausage egg muffin has been taking her baby asprine please call pt to reschedule

## 2018-01-06 ENCOUNTER — Encounter: Admission: RE | Payer: Self-pay | Source: Ambulatory Visit

## 2018-01-06 ENCOUNTER — Ambulatory Visit: Admission: RE | Admit: 2018-01-06 | Payer: Medicare HMO | Source: Ambulatory Visit | Admitting: Gastroenterology

## 2018-01-06 SURGERY — COLONOSCOPY WITH PROPOFOL
Anesthesia: General

## 2018-01-07 ENCOUNTER — Other Ambulatory Visit: Payer: Self-pay

## 2018-01-07 ENCOUNTER — Ambulatory Visit: Payer: Medicare HMO | Admitting: Physical Therapy

## 2018-01-07 ENCOUNTER — Telehealth: Payer: Self-pay | Admitting: Gastroenterology

## 2018-01-07 DIAGNOSIS — Z1211 Encounter for screening for malignant neoplasm of colon: Secondary | ICD-10-CM

## 2018-01-07 MED ORDER — NA SULFATE-K SULFATE-MG SULF 17.5-3.13-1.6 GM/177ML PO SOLN: 1.0000 | ORAL | 0 refills | Status: DC

## 2018-01-07 NOTE — Telephone Encounter (Signed)
Pt is calling to get scheduled again please call pt cb [phone]

## 2018-01-08 ENCOUNTER — Ambulatory Visit: Payer: Medicare HMO | Admitting: Physical Therapy

## 2018-01-08 ENCOUNTER — Encounter: Payer: Self-pay | Admitting: Physical Therapy

## 2018-01-08 DIAGNOSIS — M5441 Lumbago with sciatica, right side: Principal | ICD-10-CM

## 2018-01-08 DIAGNOSIS — M6281 Muscle weakness (generalized): Secondary | ICD-10-CM

## 2018-01-08 DIAGNOSIS — G8929 Other chronic pain: Secondary | ICD-10-CM | POA: Diagnosis not present

## 2018-01-08 DIAGNOSIS — M25561 Pain in right knee: Secondary | ICD-10-CM | POA: Diagnosis not present

## 2018-01-08 DIAGNOSIS — M62838 Other muscle spasm: Secondary | ICD-10-CM | POA: Diagnosis not present

## 2018-01-08 DIAGNOSIS — M48061 Spinal stenosis, lumbar region without neurogenic claudication: Secondary | ICD-10-CM | POA: Diagnosis not present

## 2018-01-08 NOTE — Therapy (Signed)
[facility] PHYS[facility] AND SPOR[facility] MEDICINE [address] [place], [place], [address] Phone: [phone]   Fax:  [phone]  Physical Therapy Treatment  Patient Details  Name: Andrea Holden MRN: [id number] Date of Birth: 11/11/1945 Referring Provider: Marene LenzKalman, Arthur DO   Encounter Date: 01/08/2018  PT End of Session - 01/08/18 2326    Visit Number  43    Number of Visits  50    Date for PT Re-Evaluation  02/02/18    Authorization Type   3 of 10 (progress)    PT Start Time  1132    PT Stop Time  1202    PT Time Calculation (min)  30 min    Activity Tolerance  Patient tolerated treatment well    Behavior During Therapy  [facility]/performed       Past Medical History:  Diagnosis Date  . Allergy    seasonal allergies  . Anemia   . Anxiety   . Aortic stenosis 12/24/2017   Echo Aug 2018  . Arthritis   . Asthma    allergy induced asthma  . Cardiac murmur 12/12/2017  . Cataract   . Clotting disorder (HCC)    history of blood clots  . Complication of anesthesia    arrhythmia following colonoscopy  . Degenerative disc disease, lumbar   . Degenerative disc disease, lumbar   . Depression   . Diabetes mellitus   . Diabetic neuropathy (HCC) 11/16/2015  . Dysrhythmia   . GERD (gastroesophageal reflux disease)   . H/O eating disorder   . H/O transfusion    patient was [facility] 5 [facility], blood type O+  . History of chicken pox   . History of hiatal hernia   . History of measles, mumps, or rubella   . HOH (hard of hearing)   . Hyperlipidemia   . Hypertension   . Irregular heartbeat   . LVH (left ventricular hypertrophy) 12/24/2017   Echo Aug 2018  . Moderate COPD (chronic obstructive pulmonary disease) (HCC) 12/14/2014  . Neuropathy   . Opiate use 11/16/2015  . Orthopnea   . Pes planus of both feet 11/16/2015  . Plantar fasciitis of right foot 11/16/2015  . Wheezing     Past Surgical History:   Procedure Laterality Date  . ABDOMINAL HYSTERECTOMY    . CATARACT EXTRACTION W/PHACO Right 05/30/2015   Procedure: CATARACT EXTRACTION PHACO AND INTRAOCULAR LENS PLACEMENT (IOC);  Surgeon: Galen ManilaWilliam Porfilio, MD;  Location: [facility] ORS;  Service: Ophthalmology;  Laterality: Right;  US 00:57 AP% 20.9 CDE 11.99 fluid pack lot #[id number]  . CHOLECYSTECTOMY    . COLON SURGERY     blocked colon  . FOOT FUSION Right 2018  . GALLBLADDER SURGERY    . GASTRIC BYPASS  2010  . internal bleeding  2016  . MOUTH SURGERY    . OVARY SURGERY    . SKIN GRAFT Right 2018   RT foot  . TENOTOMY ACHILLES TENDON     Percuntaneous  . TONSILLECTOMY      There were no vitals filed for this visit.  Subjective Assessment - 01/08/18 1132    Subjective  Patient arriving late to therapy due to being in MD office and had an injection in right knee. She is hurting in lower back and today and reports she would like to concentrate on that region today.     Pertinent History  chronic back pain and foot pain over  many years since 1995 with previous physical therapy with good results. Patient reports falling 06/02/17 which caused increased back pain and she is now waing to have evaluation by spine specialist    Limitations  Sitting;Standing;Walking;House hold activities    How long can you sit comfortably?  <1 hour    How long can you stand comfortably?  <20 min    How long can you walk comfortably?  only short distances    Patient Stated Goals  Patient wants to be abe to walk more normally with less pain in back to return to normal activities     Currently in Pain?  Yes    Pain Score  5     Pain Location  Back    Pain Orientation  Left;Lower;Mid;Right    Pain Descriptors / Indicators  Aching;Spasm;Sore    Pain Onset  More than a month ago    Pain Frequency  Intermittent          Objective:  Palpation: mild to moderate spasms palpable from thoracic spine bilaterally to lower lumbar spine right > left  Gait:  ambulating independently with cane; mild antalgic pattern; decreased weight shift to right LE   Treatment: Manual Therapy: 25 min. Goal: spasm, pain STM performed to back thoracic to lumbar region with concentration on right side thoracic to lumbar region right  and left superficial techniques seated in chair:     Patient was escorted to her car with supervision for safety at end of session  Patient response to treatment: improved soft tissue elasticity by 50% following STM and decreased pain from 5/10 to 3/10 in lower back.             PT Education - 01/08/18 1200    Education provided  Yes    Education Details  Home instruction    Person(s) Educated  Patient    Methods  Explanation    Comprehension  Verbalized understanding          PT Long Term Goals - 12/22/17 1253      PT LONG TERM GOAL #1   Title  Patient will improve MODI score to 40% or better demonstrating improved function with daily tasks and able to stand, sit and walk with less difficulty    Baseline  MODI 55%; 45% 09/09/17; 10/23/17 40%    Status  Achieved      PT LONG TERM GOAL #2   Title  Patient will ben independent with home program for pain control and progressive exercises for flexibility and strength in lumbar spine/right LE  in order to transition to home program     Baseline  limited knowldge of appropriate exercises and progression without cuing, instruction    Status  On-going    Target Date  02/02/18      PT LONG TERM GOAL #3   Title  Patient will improve MODI score to 25% or better demonstrating improved function with daily tasks and able to stand, sit and walk with less difficulty    Baseline  MODI 55%; 40% 10/23/17; 12/22/2017 35%    Status  Revised    Target Date  02/02/18            Plan - 01/08/18 1205    Clinical Impression Statement  Patient demonstrated decreased spasms and improved soft tissue elasticity with treatment. She ocnitnues with intermittent pain and limtiations with  right knee pain and core weakness and will beneifnt from continued physical therapy intervention to achieve goals.  Rehab Potential  Fair    Clinical Impairments Affecting Rehab Potential  (+) motivated (-) muliple co morbidities, chronic condition    PT Frequency  2x / week    PT Duration  6 weeks    PT Treatment/Interventions  Electrical Stimulation;Moist Heat;Ultrasound;Patient/family education;Neuromuscular re-education;Therapeutic exercise;Manual techniques    PT Next Visit Plan  pain control, ther. ex, manual therapy    PT Home Exercise Plan  stabilization exercises; observe good body mechanics for daily tasks       Patient will benefit from skilled therapeutic intervention in order to improve the following deficits and impairments:  Decreased strength, Impaired flexibility, Decreased activity tolerance, Impaired perceived functional ability, Pain, Decreased endurance, Difficulty walking  Visit Diagnosis: Chronic bilateral low back pain with right-sided sciatica  Muscle weakness (generalized)  Other muscle spasm     Problem List Patient Active Problem List   Diagnosis Date Noted  . Aortic stenosis 12/24/2017  . LVH (left ventricular hypertrophy) 12/24/2017  . Hypertension with heart disease 12/24/2017  . Left atrial dilatation 12/12/2017  . Cardiac murmur 12/12/2017  . Gastrojejunal ulcer 12/04/2017  . Hyperphosphatemia 12/04/2017  . Vaginitis 12/04/2017  . Spinal stenosis of lumbar region 06/18/2017  . Degeneration of lumbar intervertebral disc 06/02/2017  . Assistance needed with transportation 05/26/2017  . Financial difficulties 05/26/2017  . Needs assistance with community resources 05/26/2017  . Moderate recurrent major depression (HCC) 05/26/2017  . Non-healing wound of lower extremity 02/06/2017  . Status post ankle arthrodesis 12/11/2016  . Controlled substance agreement broken 10/11/2016  . Low back pain 09/25/2016  . Osteoarthritis of right subtalar  joint 07/11/2016  . Posterior tibial tendinitis of right lower extremity 07/11/2016  . Breast cancer screening 06/18/2016  . Knee pain 04/03/2016  . Hyponatremia 03/01/2016  . High triglycerides 12/24/2015  . Pes planus of both feet 11/16/2015  . Plantar fasciitis of right foot 11/16/2015  . Heel spur 11/16/2015  . Diabetic neuropathy (HCC) 11/16/2015  . Right ankle pain 11/16/2015  . Medication monitoring encounter 11/16/2015  . Chronic pain of multiple joints 08/15/2015  . Abnormal mammogram of right breast 06/19/2015  . Cerumen impaction 03/16/2015  . Hyperlipidemia LDL goal <100 12/14/2014  . History of transfusion of packed RBC 12/14/2014  . COPD, mild (HCC) 12/14/2014  . Hx of smoking 12/14/2014  . Status post bariatric surgery 12/14/2014  . Morbid obesity (HCC) 12/14/2014  . Chronic radicular lumbar pain 12/14/2014  . History of GI bleed 11/12/2014  . GERD without esophagitis 11/12/2014  . History of small bowel obstruction 09/07/2014    Beacher May PT 01/08/2018, 11:34 PM  [facility] MEDICINE [address], [place], [address] Phone: [phone]   Fax:  [phone]  Name: Andrea Holden MRN: [id number] Date of Birth: 10-Feb-1946

## 2018-01-12 ENCOUNTER — Ambulatory Visit: Payer: Medicare HMO | Admitting: Physical Therapy

## 2018-01-12 ENCOUNTER — Encounter: Payer: Self-pay | Admitting: Physical Therapy

## 2018-01-12 DIAGNOSIS — M5441 Lumbago with sciatica, right side: Secondary | ICD-10-CM | POA: Diagnosis not present

## 2018-01-12 DIAGNOSIS — M62838 Other muscle spasm: Secondary | ICD-10-CM | POA: Diagnosis not present

## 2018-01-12 DIAGNOSIS — G8929 Other chronic pain: Secondary | ICD-10-CM

## 2018-01-12 DIAGNOSIS — M6281 Muscle weakness (generalized): Secondary | ICD-10-CM | POA: Diagnosis not present

## 2018-01-13 ENCOUNTER — Telehealth: Payer: Self-pay | Admitting: Family Medicine

## 2018-01-13 MED ORDER — LISINOPRIL 10 MG PO TABS: 10.0000 mg | ORAL_TABLET | Freq: Every day | ORAL | 1 refills | Status: DC

## 2018-01-13 NOTE — Telephone Encounter (Signed)
Pt contacted she states needs new rx for the 10mg  strength so she is not having to take 2 pills.  Also states has some pick eye and would like rx sent in?  I told her she would need appt but insisted on me asking?

## 2018-01-13 NOTE — Therapy (Signed)
[facility] MEDICINE [address] [place], [address] Phone: [phone]   Fax:  [phone]  Physical Therapy Treatment  Patient Details  Name: Andrea Holden MRN: [id number] Date of Birth: [birth year] Referring Provider: Marene Lenz DO   Encounter Date: 01/12/2018  PT End of Session - 01/12/18 1347    Visit Number  44    Number of Visits  50    Date for PT Re-Evaluation  02/02/18    Authorization Type   4 of 10 (progress)    PT Start Time  1310    PT Stop Time  1345    PT Time Calculation (min)  35 min    Activity Tolerance  Patient tolerated treatment well    Behavior During Therapy  [facility] for tasks assessed/performed       Past Medical History:  Diagnosis Date  . Allergy    seasonal allergies  . Anemia   . Anxiety   . Aortic stenosis 12/24/2017   Echo Aug 2018  . Arthritis   . Asthma    allergy induced asthma  . Cardiac murmur 12/12/2017  . Cataract   . Clotting disorder (HCC)    history of blood clots  . Complication of anesthesia    arrhythmia following colonoscopy  . Degenerative disc disease, lumbar   . Degenerative disc disease, lumbar   . Depression   . Diabetes mellitus   . Diabetic neuropathy (HCC) 11/16/2015  . Dysrhythmia   . GERD (gastroesophageal reflux disease)   . H/O eating disorder   . H/O transfusion    patient was given 5 units of blood while at [facility], blood type O+  . History of chicken pox   . History of hiatal hernia   . History of measles, mumps, or rubella   . HOH (hard of hearing)   . Hyperlipidemia   . Hypertension   . Irregular heartbeat   . LVH (left ventricular hypertrophy) 12/24/2017   Echo Aug 2018  . Moderate COPD (chronic obstructive pulmonary disease) (HCC) 12/14/2014  . Neuropathy   . Opiate use 11/16/2015  . Orthopnea   . Pes planus of both feet 11/16/2015  . Plantar fasciitis of right foot 11/16/2015  . Wheezing     Past Surgical History:   Procedure Laterality Date  . ABDOMINAL HYSTERECTOMY    . CATARACT EXTRACTION W/PHACO Right 05/30/2015   Procedure: CATARACT EXTRACTION PHACO AND INTRAOCULAR LENS PLACEMENT (IOC);  Surgeon: Galen Manila, MD;  Location: [facility] ORS;  Service: Ophthalmology;  Laterality: Right;  [place] 00:57 AP% 20.9 CDE 11.99 fluid pack lot #[id number]  . CHOLECYSTECTOMY    . COLON SURGERY     blocked colon  . FOOT FUSION Right 2018  . GALLBLADDER SURGERY    . GASTRIC BYPASS  2010  . internal bleeding  2016  . MOUTH SURGERY    . OVARY SURGERY    . SKIN GRAFT Right 2018   RT foot  . TENOTOMY ACHILLES TENDON     Percuntaneous  . TONSILLECTOMY      There were no vitals filed for this visit.  Subjective Assessment - 01/12/18 1311    Subjective  Patient arriving 10 minutes late. She reports her knee continues with pain and reports no change in her knee since the injection last week.     Pertinent History  chronic back pain and foot pain over many years since 1995 with previous physical therapy with good results. Patient reports  falling 06/02/17 which caused increased back pain and she is now waing to have evaluation by spine specialist    Limitations  Sitting;Standing;Walking;House hold activities    How long can you sit comfortably?  <1 hour    How long can you stand comfortably?  <20 min    How long can you walk comfortably?  only short distances    Patient Stated Goals  Patient wants to be abe to walk more normally with less pain in back to return to normal activities     Currently in Pain?  Yes    Pain Score  5     Pain Location  Back    Pain Orientation  Left    Pain Descriptors / Indicators  Aching    Pain Onset  More than a month ago    Pain Frequency  Intermittent         Objective:  Palpation: mild to moderate spasms palpable from thoracic spine bilaterally to lower lumbar spine   Gait: ambulating independently with cane; mild antalgic pattern; decreased weight shift to right LE    Treatment: Manual Therapy: 25 min. Goal: spasm, pain STM performed to back thoracic to lumbar region with concentration on right side thoracic to lumbar region right  and left superficial techniques seated in chair:     Patient was escorted to her car with supervision for safety at end of session   Patient response to treatment: improved soft tissue elasticity with decreased spasms by 50% following STM and decreased pain from 5/10 to < 3/10 in lower back.         PT Education - 01/12/18 1331    Education provided  Yes    Education Details  Home instruction for pain control, posture awareness    Person(s) Educated  Patient    Methods  Explanation    Comprehension  Verbalized understanding          PT Long Term Goals - 12/22/17 1253      PT LONG TERM GOAL #1   Title  Patient will improve MODI score to 40% or better demonstrating improved function with daily tasks and able to stand, sit and walk with less difficulty    Baseline  MODI 55%; 45% 09/09/17; 10/23/17 40%    Status  Achieved      PT LONG TERM GOAL #2   Title  Patient will ben independent with home program for pain control and progressive exercises for flexibility and strength in lumbar spine/right LE  in order to transition to home program     Baseline  limited knowldge of appropriate exercises and progression without cuing, instruction    Status  On-going    Target Date  02/02/18      PT LONG TERM GOAL #3   Title  Patient will improve MODI score to 25% or better demonstrating improved function with daily tasks and able to stand, sit and walk with less difficulty    Baseline  MODI 55%; 40% 10/23/17; 12/22/2017 35%    Status  Revised    Target Date  02/02/18            Plan - 01/12/18 1353    Clinical Impression Statement  Patient continues to progress with decreasing lower back pain and is having right knee pain that is limiting her function with walking and daily tasks. She was limited today with exercisese  due to arriving late and having incresaed upper back spasms/pain which was focus of session.  Rehab Potential  Fair    Clinical Impairments Affecting Rehab Potential  (+) motivated (-) muliple co morbidities, chronic condition    PT Frequency  2x / week    PT Duration  6 weeks    PT Treatment/Interventions  Electrical Stimulation;Moist Heat;Ultrasound;Patient/family education;Neuromuscular re-education;Therapeutic exercise;Manual techniques    PT Next Visit Plan  pain control, ther. ex, manual therapy    PT Home Exercise Plan  stabilization exercises; observe good body mechanics for daily tasks       Patient will benefit from skilled therapeutic intervention in order to improve the following deficits and impairments:  Decreased strength, Impaired flexibility, Decreased activity tolerance, Impaired perceived functional ability, Pain, Decreased endurance, Difficulty walking  Visit Diagnosis: Chronic bilateral low back pain with right-sided sciatica  Muscle weakness (generalized)  Other muscle spasm     Problem List Patient Active Problem List   Diagnosis Date Noted  . Aortic stenosis 12/24/2017  . LVH (left ventricular hypertrophy) 12/24/2017  . Hypertension with heart disease 12/24/2017  . Left atrial dilatation 12/12/2017  . Cardiac murmur 12/12/2017  . Gastrojejunal ulcer 12/04/2017  . Hyperphosphatemia 12/04/2017  . Vaginitis 12/04/2017  . Spinal stenosis of lumbar region 06/18/2017  . Degeneration of lumbar intervertebral disc 06/02/2017  . Assistance needed with transportation 05/26/2017  . Financial difficulties 05/26/2017  . Needs assistance with community resources 05/26/2017  . Moderate recurrent major depression (HCC) 05/26/2017  . Non-healing wound of lower extremity 02/06/2017  . Status post ankle arthrodesis 12/11/2016  . Controlled substance agreement broken 10/11/2016  . Low back pain 09/25/2016  . Osteoarthritis of right subtalar joint 07/11/2016  .  Posterior tibial tendinitis of right lower extremity 07/11/2016  . Breast cancer screening 06/18/2016  . Knee pain 04/03/2016  . Hyponatremia 03/01/2016  . High triglycerides 12/24/2015  . Pes planus of both feet 11/16/2015  . Plantar fasciitis of right foot 11/16/2015  . Heel spur 11/16/2015  . Diabetic neuropathy (HCC) 11/16/2015  . Right ankle pain 11/16/2015  . Medication monitoring encounter 11/16/2015  . Chronic pain of multiple joints 08/15/2015  . Abnormal mammogram of right breast 06/19/2015  . Cerumen impaction 03/16/2015  . Hyperlipidemia LDL goal <100 12/14/2014  . History of transfusion of packed RBC 12/14/2014  . COPD, mild (HCC) 12/14/2014  . Hx of smoking 12/14/2014  . Status post bariatric surgery 12/14/2014  . Morbid obesity (HCC) 12/14/2014  . Chronic radicular lumbar pain 12/14/2014  . History of GI bleed 11/12/2014  . GERD without esophagitis 11/12/2014  . History of small bowel obstruction 09/07/2014    Beacher May PT 01/13/2018, 12:59 PM  [facility] PHYS[facility] AND SPORTS MEDICINE [address], [place], [address] Phone: [phone]   Fax:  [phone]  Name: Andrea Holden MRN: [id number] Date of Birth: 05-12-1946

## 2018-01-13 NOTE — Telephone Encounter (Signed)
Copied from CRM [id number]. Topic: Quick Communication - Rx Refill/Question >> Jan 13, 2018  3:11 PM Arlyss Gandyichardson, Wyley Hack N, NT wrote: Medication: lisinopril (PRINIVIL,ZESTRIL) 5 MG tablet. Pt states that the rx was increased to 10 mg.  Has the patient contacted their pharmacy? Yes.   (Agent: If no, request that the patient contact the pharmacy for the refill.) (Agent: If yes, when and what did the pharmacy advise?)  Preferred Pharmacy (with phone number or street name): [facility] [place] - [address] [phone] (Phone) [phone] (Fax)      Agent: Please be advised that RX refills may take up to 3 business days. We ask that you follow-up with your pharmacy.

## 2018-01-13 NOTE — Telephone Encounter (Signed)
For the pink eye, that will need an e-visit or appointment I can change the lisinopril and send that in; thank you

## 2018-01-14 ENCOUNTER — Encounter: Payer: Medicare HMO | Admitting: Physical Therapy

## 2018-01-15 ENCOUNTER — Ambulatory Visit: Payer: Medicare HMO | Admitting: Physical Therapy

## 2018-01-15 ENCOUNTER — Encounter: Payer: Self-pay | Admitting: Physical Therapy

## 2018-01-15 DIAGNOSIS — M62838 Other muscle spasm: Secondary | ICD-10-CM | POA: Diagnosis not present

## 2018-01-15 DIAGNOSIS — M6281 Muscle weakness (generalized): Secondary | ICD-10-CM

## 2018-01-15 DIAGNOSIS — G8929 Other chronic pain: Secondary | ICD-10-CM

## 2018-01-15 DIAGNOSIS — M5441 Lumbago with sciatica, right side: Secondary | ICD-10-CM | POA: Diagnosis not present

## 2018-01-15 NOTE — Therapy (Signed)
[place] [facility]INE [address] [place], [address] Phone: [phone]   Fax:  [phone]  Physical Therapy Treatment  Patient Details  Name: Andrea Holden MRN: [id number] Date of Birth: [birth year] Referring Provider: Marene Lenz DO   Encounter Date: 01/15/2018  PT End of Session - 01/15/18 1133    Visit Number  45    Number of Visits  50    Date for PT Re-Evaluation  02/02/18    Authorization Type   5 of 10 (progress)    PT Start Time  1126    PT Stop Time  1208    PT Time Calculation (min)  42 min    Activity Tolerance  Patient tolerated treatment well    Behavior During Therapy  [facility] for tasks assessed/performed       Past Medical History:  Diagnosis Date  . Allergy    seasonal allergies  . Anemia   . Anxiety   . Aortic stenosis 12/24/2017   Echo Aug 2018  . Arthritis   . Asthma    allergy induced asthma  . Cardiac murmur 12/12/2017  . Cataract   . Clotting disorder (HCC)    history of blood clots  . Complication of anesthesia    arrhythmia following colonoscopy  . Degenerative disc disease, lumbar   . Degenerative disc disease, lumbar   . Depression   . Diabetes mellitus   . Diabetic neuropathy (HCC) 11/16/2015  . Dysrhythmia   . GERD (gastroesophageal reflux disease)   . H/O eating disorder   . H/O transfusion    patient was given 5 units of blood while at [facility], blood type O+  . History of chicken pox   . History of hiatal hernia   . History of measles, mumps, or rubella   . HOH (hard of hearing)   . Hyperlipidemia   . Hypertension   . Irregular heartbeat   . LVH (left ventricular hypertrophy) 12/24/2017   Echo Aug 2018  . Moderate COPD (chronic obstructive pulmonary disease) (HCC) 12/14/2014  . Neuropathy   . Opiate use 11/16/2015  . Orthopnea   . Pes planus of both feet 11/16/2015  . Plantar fasciitis of right foot 11/16/2015  . Wheezing     Past Surgical History:   Procedure Laterality Date  . ABDOMINAL HYSTERECTOMY    . CATARACT EXTRACTION W/PHACO Right 05/30/2015   Procedure: CATARACT EXTRACTION PHACO AND INTRAOCULAR LENS PLACEMENT (IOC);  Surgeon: Galen Manila, MD;  Location: [facility] ORS;  Service: Ophthalmology;  Laterality: Right;  [place] 00:57 AP% 20.9 CDE 11.99 fluid pack lot #[id number]  . CHOLECYSTECTOMY    . COLON SURGERY     blocked colon  . FOOT FUSION Right 2018  . GALLBLADDER SURGERY    . GASTRIC BYPASS  2010  . internal bleeding  2016  . MOUTH SURGERY    . OVARY SURGERY    . SKIN GRAFT Right 2018   RT foot  . TENOTOMY ACHILLES TENDON     Percuntaneous  . TONSILLECTOMY      There were no vitals filed for this visit.  Subjective Assessment - 01/15/18 1227    Subjective  Patient reports feeling "sore all over" today and is still having significant pain and fear of falling due to pain in right knee. She reports she is exercising at home and has pain with stretches for upper back and would like to review this.     Pertinent History  chronic back pain and foot pain over many years since 1995 with previous physical therapy with good results. Patient reports falling 06/02/17 which caused increased back pain and she is now waing to have evaluation by spine specialist    Limitations  Sitting;Standing;Walking;House hold activities    How long can you sit comfortably?  <1 hour    How long can you stand comfortably?  <20 min    How long can you walk comfortably?  only short distances    Patient Stated Goals  Patient wants to be abe to walk more normally with less pain in back to return to normal activities     Currently in Pain?  Yes    Pain Score  5     Pain Location  Back    Pain Orientation  Right;Left;Upper;Lower    Pain Descriptors / Indicators  Aching;Sore    Pain Type  Chronic pain    Pain Onset  More than a month ago    Pain Frequency  Intermittent         Objective:  Palpation: mild to moderate spasms palpable from thoracic  spine right to lower lumbar spine and left side lower lumbar paraspinal muscles Gait: ambulating independently with cane; mild antalgic pattern; decreased weight shift to right LE, slow cadence   Treatment: Therapeutic exercise: patient performed with demonstration, verbal cues and assistance of therapist: goal: strenth, improved endurance, function   siting in chair: Straight arm pull downs with blue resistive band x 15 bilateral rows blue resistive band x 15 palloff press  25x to abdomen Biceps curls blue resistive band x 15 reps  instructed patient in thoracic spine self mobilization in chair with towel roll: demonstrated and patient returned demonstration with assistance of therapist to place towel roll in appropriate position  LE's: knee flexion with assistance blue resistive band x 20 reps right LE, 25 left LE hip abduction with blue resistive band x 25 reps hip flexion each LE with blue resistive band x 10 reps  Manual Therapy: 15 min. Goal: spasm, pain STM performed to back thoracic to lumbar region with concentration on right side thoracic to lumbar region right  and left superficial techniques seated in chair:     Patient was escorted to her car with supervision for safety at end of session   Patient response to treatment: patient demonstrated improved technique with exercises with minimal VC for correct alignment. Patient with decreased pain from   5/10 to 3/10. Patient with decreased spasms by  30% following STM.           PT Education - 01/15/18 1228    Education provided  Yes    Education Details  exercise insruction; modified sretching of upper back with using a towel for self mobilization of thoracic spine instead of rounding shoulders and back forward    Person(s) Educated  Patient    Methods  Explanation;Demonstration;Verbal cues    Comprehension  Verbalized understanding;Returned demonstration;Verbal cues required          PT Long Term Goals - 12/22/17  1253      PT LONG TERM GOAL #1   Title  Patient will improve MODI score to 40% or better demonstrating improved function with daily tasks and able to stand, sit and walk with less difficulty    Baseline  MODI 55%; 45% 09/09/17; 10/23/17 40%    Status  Achieved      PT LONG TERM GOAL #2   Title  Patient will ben  independent with home program for pain control and progressive exercises for flexibility and strength in lumbar spine/right LE  in order to transition to home program     Baseline  limited knowldge of appropriate exercises and progression without cuing, instruction    Status  On-going    Target Date  02/02/18      PT LONG TERM GOAL #3   Title  Patient will improve MODI score to 25% or better demonstrating improved function with daily tasks and able to stand, sit and walk with less difficulty    Baseline  MODI 55%; 40% 10/23/17; 12/22/2017 35%    Status  Revised    Target Date  02/02/18            Plan - 01/15/18 1219    Clinical Impression Statement  Patient demonstrated improved posture, decreased spasms with treatment and was able to perform exercises without reports of increased pain. She continues with chief concerns of back pain and right knee pain that is limiting full function with daily activities and she will benefit from continued physical therapy intervention to address limitations and achieve goals.     Rehab Potential  Fair    Clinical Impairments Affecting Rehab Potential  (+) motivated (-) muliple co morbidities, chronic condition    PT Frequency  2x / week    PT Duration  6 weeks    PT Treatment/Interventions  Electrical Stimulation;Moist Heat;Ultrasound;Patient/family education;Neuromuscular re-education;Therapeutic exercise;Manual techniques    PT Next Visit Plan  pain control, ther. ex, manual therapy    PT Home Exercise Plan  stabilization exercises; observe good body mechanics for daily tasks       Patient will benefit from skilled therapeutic  intervention in order to improve the following deficits and impairments:  Decreased strength, Impaired flexibility, Decreased activity tolerance, Impaired perceived functional ability, Pain, Decreased endurance, Difficulty walking  Visit Diagnosis: Chronic bilateral low back pain with right-sided sciatica  Muscle weakness (generalized)  Other muscle spasm     Problem List Patient Active Problem List   Diagnosis Date Noted  . Aortic stenosis 12/24/2017  . LVH (left ventricular hypertrophy) 12/24/2017  . Hypertension with heart disease 12/24/2017  . Left atrial dilatation 12/12/2017  . Cardiac murmur 12/12/2017  . Gastrojejunal ulcer 12/04/2017  . Hyperphosphatemia 12/04/2017  . Vaginitis 12/04/2017  . Spinal stenosis of lumbar region 06/18/2017  . Degeneration of lumbar intervertebral disc 06/02/2017  . Assistance needed with transportation 05/26/2017  . Financial difficulties 05/26/2017  . Needs assistance with community resources 05/26/2017  . Moderate recurrent major depression (HCC) 05/26/2017  . Non-healing wound of lower extremity 02/06/2017  . Status post ankle arthrodesis 12/11/2016  . Controlled substance agreement broken 10/11/2016  . Low back pain 09/25/2016  . Osteoarthritis of right subtalar joint 07/11/2016  . Posterior tibial tendinitis of right lower extremity 07/11/2016  . Breast cancer screening 06/18/2016  . Knee pain 04/03/2016  . Hyponatremia 03/01/2016  . High triglycerides 12/24/2015  . Pes planus of both feet 11/16/2015  . Plantar fasciitis of right foot 11/16/2015  . Heel spur 11/16/2015  . Diabetic neuropathy (HCC) 11/16/2015  . Right ankle pain 11/16/2015  . Medication monitoring encounter 11/16/2015  . Chronic pain of multiple joints 08/15/2015  . Abnormal mammogram of right breast 06/19/2015  . Cerumen impaction 03/16/2015  . Hyperlipidemia LDL goal <100 12/14/2014  . History of transfusion of packed RBC 12/14/2014  . COPD, mild (HCC)  12/14/2014  . Hx of smoking 12/14/2014  . Status post  bariatric surgery 12/14/2014  . Morbid obesity (HCC) 12/14/2014  . Chronic radicular lumbar pain 12/14/2014  . History of GI bleed 11/12/2014  . GERD without esophagitis 11/12/2014  . History of small bowel obstruction 09/07/2014    Beacher May PT 01/15/2018, 12:30 PM  [place] [facility] PHYS[facility] SPORTS MEDICINE [address] [place], [address] Phone: [phone]   Fax:  [phone]  Name: Andrea Holden MRN: [id number] Date of Birth: [birth year]

## 2018-01-19 ENCOUNTER — Ambulatory Visit: Payer: Medicare HMO | Admitting: Physical Therapy

## 2018-01-19 ENCOUNTER — Encounter: Payer: Self-pay | Admitting: Physical Therapy

## 2018-01-19 ENCOUNTER — Encounter: Payer: Self-pay | Admitting: *Deleted

## 2018-01-19 DIAGNOSIS — M5441 Lumbago with sciatica, right side: Principal | ICD-10-CM

## 2018-01-19 DIAGNOSIS — G8929 Other chronic pain: Secondary | ICD-10-CM | POA: Diagnosis not present

## 2018-01-19 DIAGNOSIS — M6281 Muscle weakness (generalized): Secondary | ICD-10-CM | POA: Diagnosis not present

## 2018-01-19 DIAGNOSIS — M62838 Other muscle spasm: Secondary | ICD-10-CM | POA: Diagnosis not present

## 2018-01-19 NOTE — Therapy (Signed)
[place] [facility] [address] [place], [address] Phone: [phone]   Fax:  [phone]  Physical Therapy Treatment  Patient Details  Name: Andrea Holden MRN: [id number] Date of Birth: [birth year] Referring Provider: Marene Lenz DO   Encounter Date: 01/19/2018  PT End of Session - 01/19/18 1307    Visit Number  46    Number of Visits  50    Date for PT Re-Evaluation  02/02/18    Authorization Type   6 of 10 (progress)    PT Start Time  1300    PT Stop Time  1350    PT Time Calculation (min)  50 min    Activity Tolerance  Patient tolerated treatment well    Behavior During Therapy  [facility] for tasks assessed/performed       Past Medical History:  Diagnosis Date  . Allergy    seasonal allergies  . Anemia   . Anxiety   . Aortic stenosis 12/24/2017   Echo Aug 2018  . Arthritis   . Asthma    allergy induced asthma  . Cardiac murmur 12/12/2017  . Cataract   . Clotting disorder (HCC)    history of blood clots  . Complication of anesthesia    arrhythmia following colonoscopy  . Degenerative disc disease, lumbar   . Degenerative disc disease, lumbar   . Depression   . Diabetes mellitus   . Diabetic neuropathy (HCC) 11/16/2015  . Dysrhythmia   . GERD (gastroesophageal reflux disease)   . H/O eating disorder   . H/O transfusion    patient was given 5 units of blood while at [facility], blood type O+  . History of chicken pox   . History of hiatal hernia   . History of measles, mumps, or rubella   . HOH (hard of hearing)   . Hyperlipidemia   . Hypertension   . Irregular heartbeat   . LVH (left ventricular hypertrophy) 12/24/2017   Echo Aug 2018  . Moderate COPD (chronic obstructive pulmonary disease) (HCC) 12/14/2014  . Neuropathy   . Opiate use 11/16/2015  . Orthopnea   . Pes planus of both feet 11/16/2015  . Plantar fasciitis of right foot 11/16/2015  . Wheezing     Past Surgical History:   Procedure Laterality Date  . ABDOMINAL HYSTERECTOMY    . CATARACT EXTRACTION W/PHACO Right 05/30/2015   Procedure: CATARACT EXTRACTION PHACO AND INTRAOCULAR LENS PLACEMENT (IOC);  Surgeon: Galen Manila, MD;  Location: [facility] ORS;  Service: Ophthalmology;  Laterality: Right;  [place] 00:57 AP% 20.9 CDE 11.99 fluid pack lot #[id number]  . CHOLECYSTECTOMY    . COLON SURGERY     blocked colon  . EYE SURGERY    . FOOT FUSION Right 2018  . GALLBLADDER SURGERY    . GASTRIC BYPASS  2010  . internal bleeding  2016  . MOUTH SURGERY    . OVARY SURGERY    . SKIN GRAFT Right 2018   RT foot  . TENOTOMY ACHILLES TENDON     Percuntaneous  . TONSILLECTOMY      There were no vitals filed for this visit.  Subjective Assessment - 01/19/18 1301    Subjective  Patient reports she is doing well and continues with pain in lower back and right side mid back. She is exercising inconsistently at home and feels she needs continued guidance with exercises for correct technique.     Pertinent History  chronic back pain  and foot pain over many years since 1995 with previous physical therapy with good results. Patient reports falling 06/02/17 which caused increased back pain and she is now waing to have evaluation by spine specialist    Limitations  Sitting;Standing;Walking;House hold activities    How long can you sit comfortably?  <1 hour    How long can you stand comfortably?  <20 min    How long can you walk comfortably?  only short distances    Patient Stated Goals  Patient wants to be abe to walk more normally with less pain in back to return to normal activities     Currently in Pain?  Yes    Pain Score  3     Pain Location  Back    Pain Orientation  Right;Left;Lower;Mid    Pain Descriptors / Indicators  Aching;Sore    Pain Type  Chronic pain    Pain Onset  More than a month ago    Pain Frequency  Intermittent          Objective:  Palpation: mild to moderate spasms palpable along thoracic spine.  Medial border of scapula right Gait: ambulating independently with cane; mild antalgic pattern; decreased weight shift to right LE, improved cadence   Treatment: Therapeutic exercise: patient performed with demonstration, verbal cues and assistance of therapist: goal: strenth, improved endurance, function   siting in chair: Straight arm pull downs with blue resistive band x 15 bilateral rows blue resistive band x 15 palloff press  20x to abdomen Biceps curls blue resistive band x 15 reps 4# weight: bilateral biceps curls x 15 scapular bent over rows bilateral x 15 raise 4# weight up overhead, core control, x 15 4# weight rotate side to side x 15   LE's: knee flexion with assistance blue resistive band x 20 reps right LE, 25 left LE hip abduction with blue resistive band x 25 reps hip flexion each LE with blue resistive band x 10 reps standing mini squats with VC and repeated demonstration for correct technique  to not let knees go beyond toes x 10 standing single leg dead lift partial range demonstrated only with patient verbalizing understanding    Manual Therapy: 15 min. Goal: spasm, pain STM performed to back thoracic to lumbar region with concentration on right side thoracic to lumbar region right  and left superficial techniques seated in chair:     Patient was escorted to her car with supervision for safety at end of session   Patient response to treatment: improved technique with exercises with minimal to moderate VC and demonstration; improved endurance with all exercises as compared to previous session. Soft tissue mobility/elasticity improved 50% following STM     PT Education - 01/19/18 1307    Education provided  Yes    Education Details  exercise instruction     Person(s) Educated  Patient    Methods  Explanation;Demonstration;Verbal cues    Comprehension  Verbalized understanding;Returned demonstration;Verbal cues required          PT Long Term Goals -  12/22/17 1253      PT LONG TERM GOAL #1   Title  Patient will improve MODI score to 40% or better demonstrating improved function with daily tasks and able to stand, sit and walk with less difficulty    Baseline  MODI 55%; 45% 09/09/17; 10/23/17 40%    Status  Achieved      PT LONG TERM GOAL #2   Title  Patient will ben  independent with home program for pain control and progressive exercises for flexibility and strength in lumbar spine/right LE  in order to transition to home program     Baseline  limited knowldge of appropriate exercises and progression without cuing, instruction    Status  On-going    Target Date  02/02/18      PT LONG TERM GOAL #3   Title  Patient will improve MODI score to 25% or better demonstrating improved function with daily tasks and able to stand, sit and walk with less difficulty    Baseline  MODI 55%; 40% 10/23/17; 12/22/2017 35%    Status  Revised    Target Date  02/02/18            Plan - 01/19/18 1309    Clinical Impression Statement  Patient demostrates improved endurance and technique with exercises.     Rehab Potential  Fair    Clinical Impairments Affecting Rehab Potential  (+) motivated (-) muliple co morbidities, chronic condition    PT Frequency  2x / week    PT Duration  6 weeks    PT Treatment/Interventions  Electrical Stimulation;Moist Heat;Ultrasound;Patient/family education;Neuromuscular re-education;Therapeutic exercise;Manual techniques    PT Next Visit Plan  pain control, ther. ex, manual therapy    PT Home Exercise Plan  stabilization exercises; observe good body mechanics for daily tasks       Patient will benefit from skilled therapeutic intervention in order to improve the following deficits and impairments:  Decreased strength, Impaired flexibility, Decreased activity tolerance, Impaired perceived functional ability, Pain, Decreased endurance, Difficulty walking  Visit Diagnosis: Chronic bilateral low back pain with right-sided  sciatica  Muscle weakness (generalized)  Other muscle spasm     Problem List Patient Active Problem List   Diagnosis Date Noted  . Aortic stenosis 12/24/2017  . LVH (left ventricular hypertrophy) 12/24/2017  . Hypertension with heart disease 12/24/2017  . Left atrial dilatation 12/12/2017  . Cardiac murmur 12/12/2017  . Gastrojejunal ulcer 12/04/2017  . Hyperphosphatemia 12/04/2017  . Vaginitis 12/04/2017  . Spinal stenosis of lumbar region 06/18/2017  . Degeneration of lumbar intervertebral disc 06/02/2017  . Assistance needed with transportation 05/26/2017  . Financial difficulties 05/26/2017  . Needs assistance with community resources 05/26/2017  . Moderate recurrent major depression (HCC) 05/26/2017  . Non-healing wound of lower extremity 02/06/2017  . Status post ankle arthrodesis 12/11/2016  . Controlled substance agreement broken 10/11/2016  . Low back pain 09/25/2016  . Osteoarthritis of right subtalar joint 07/11/2016  . Posterior tibial tendinitis of right lower extremity 07/11/2016  . Breast cancer screening 06/18/2016  . Knee pain 04/03/2016  . Hyponatremia 03/01/2016  . High triglycerides 12/24/2015  . Pes planus of both feet 11/16/2015  . Plantar fasciitis of right foot 11/16/2015  . Heel spur 11/16/2015  . Diabetic neuropathy (HCC) 11/16/2015  . Right ankle pain 11/16/2015  . Medication monitoring encounter 11/16/2015  . Chronic pain of multiple joints 08/15/2015  . Abnormal mammogram of right breast 06/19/2015  . Cerumen impaction 03/16/2015  . Hyperlipidemia LDL goal <100 12/14/2014  . History of transfusion of packed RBC 12/14/2014  . COPD, mild (HCC) 12/14/2014  . Hx of smoking 12/14/2014  . Status post bariatric surgery 12/14/2014  . Morbid obesity (HCC) 12/14/2014  . Chronic radicular lumbar pain 12/14/2014  . History of GI bleed 11/12/2014  . GERD without esophagitis 11/12/2014  . History of small bowel obstruction 09/07/2014    Beacher May PT 01/19/2018, 2:02 PM  [place] [facility] PH[facility]TS MEDICINE [address] [place], [address] Phone: [phone]   Fax:  [phone]  Name: Andrea Holden MRN: [id number] Date of Birth: [birth year]

## 2018-01-20 ENCOUNTER — Encounter: Payer: Self-pay | Admitting: Certified Registered Nurse Anesthetist

## 2018-01-20 ENCOUNTER — Encounter
Admission: RE | Disposition: A | Discharge status: Home / Self Care | Payer: Self-pay | Source: Ambulatory Visit | Attending: Gastroenterology

## 2018-01-20 ENCOUNTER — Ambulatory Visit
Admission: RE | Admit: 2018-01-20 | Discharge: 2018-01-20 | Disposition: A | Discharge status: Home / Self Care | Payer: Medicare HMO | Source: Ambulatory Visit | Attending: Gastroenterology | Admitting: Gastroenterology

## 2018-01-20 ENCOUNTER — Ambulatory Visit: Payer: Medicare HMO | Admitting: Certified Registered Nurse Anesthetist

## 2018-01-20 ENCOUNTER — Other Ambulatory Visit: Payer: Self-pay

## 2018-01-20 DIAGNOSIS — E1140 Type 2 diabetes mellitus with diabetic neuropathy, unspecified: Secondary | ICD-10-CM | POA: Diagnosis not present

## 2018-01-20 DIAGNOSIS — K219 Gastro-esophageal reflux disease without esophagitis: Secondary | ICD-10-CM | POA: Insufficient documentation

## 2018-01-20 DIAGNOSIS — Z8249 Family history of ischemic heart disease and other diseases of the circulatory system: Secondary | ICD-10-CM | POA: Diagnosis not present

## 2018-01-20 DIAGNOSIS — F419 Anxiety disorder, unspecified: Secondary | ICD-10-CM | POA: Diagnosis not present

## 2018-01-20 DIAGNOSIS — E785 Hyperlipidemia, unspecified: Secondary | ICD-10-CM | POA: Insufficient documentation

## 2018-01-20 DIAGNOSIS — K5730 Diverticulosis of large intestine without perforation or abscess without bleeding: Secondary | ICD-10-CM | POA: Insufficient documentation

## 2018-01-20 DIAGNOSIS — Z860100 Personal history of colon polyps, unspecified: Secondary | ICD-10-CM

## 2018-01-20 DIAGNOSIS — Z886 Allergy status to analgesic agent status: Secondary | ICD-10-CM | POA: Insufficient documentation

## 2018-01-20 DIAGNOSIS — Z1211 Encounter for screening for malignant neoplasm of colon: Secondary | ICD-10-CM | POA: Insufficient documentation

## 2018-01-20 DIAGNOSIS — Z87891 Personal history of nicotine dependence: Secondary | ICD-10-CM | POA: Diagnosis not present

## 2018-01-20 DIAGNOSIS — K640 First degree hemorrhoids: Secondary | ICD-10-CM | POA: Insufficient documentation

## 2018-01-20 DIAGNOSIS — I4891 Unspecified atrial fibrillation: Secondary | ICD-10-CM | POA: Diagnosis not present

## 2018-01-20 DIAGNOSIS — I10 Essential (primary) hypertension: Secondary | ICD-10-CM | POA: Insufficient documentation

## 2018-01-20 DIAGNOSIS — J449 Chronic obstructive pulmonary disease, unspecified: Secondary | ICD-10-CM | POA: Diagnosis not present

## 2018-01-20 DIAGNOSIS — Z7951 Long term (current) use of inhaled steroids: Secondary | ICD-10-CM | POA: Diagnosis not present

## 2018-01-20 DIAGNOSIS — Z888 Allergy status to other drugs, medicaments and biological substances status: Secondary | ICD-10-CM | POA: Insufficient documentation

## 2018-01-20 DIAGNOSIS — Z961 Presence of intraocular lens: Secondary | ICD-10-CM | POA: Diagnosis not present

## 2018-01-20 DIAGNOSIS — Z7982 Long term (current) use of aspirin: Secondary | ICD-10-CM | POA: Insufficient documentation

## 2018-01-20 DIAGNOSIS — Z9841 Cataract extraction status, right eye: Secondary | ICD-10-CM | POA: Diagnosis not present

## 2018-01-20 DIAGNOSIS — F329 Major depressive disorder, single episode, unspecified: Secondary | ICD-10-CM | POA: Diagnosis not present

## 2018-01-20 DIAGNOSIS — Z79899 Other long term (current) drug therapy: Secondary | ICD-10-CM | POA: Insufficient documentation

## 2018-01-20 HISTORY — PX: COLONOSCOPY WITH PROPOFOL: SHX5780

## 2018-01-20 SURGERY — COLONOSCOPY WITH PROPOFOL
Anesthesia: General

## 2018-01-20 MED ORDER — LIDOCAINE HCL (PF) 2 % IJ SOLN
INTRAMUSCULAR | Status: AC
  Filled 2018-01-20: qty 10

## 2018-01-20 MED ORDER — PROPOFOL 10 MG/ML IV BOLUS
INTRAVENOUS | Status: DC | PRN
  Administered 2018-01-20: 80 mg via INTRAVENOUS

## 2018-01-20 MED ORDER — PROPOFOL 500 MG/50ML IV EMUL
INTRAVENOUS | Status: DC | PRN
  Administered 2018-01-20: 140 ug/kg/min via INTRAVENOUS

## 2018-01-20 MED ORDER — SODIUM CHLORIDE 0.9 % IV SOLN
INTRAVENOUS | Status: DC
  Administered 2018-01-20: 09:00:00 via INTRAVENOUS

## 2018-01-20 MED ORDER — PROPOFOL 500 MG/50ML IV EMUL
INTRAVENOUS | Status: AC
  Filled 2018-01-20: qty 50

## 2018-01-20 MED ORDER — EPHEDRINE SULFATE 50 MG/ML IJ SOLN
INTRAMUSCULAR | Status: DC | PRN
  Administered 2018-01-20: 15 mg via INTRAVENOUS

## 2018-01-20 MED ORDER — LIDOCAINE HCL (CARDIAC) PF 100 MG/5ML IV SOSY
PREFILLED_SYRINGE | INTRAVENOUS | Status: DC | PRN
  Administered 2018-01-20: 50 mg via INTRAVENOUS

## 2018-01-20 MED ORDER — PHENYLEPHRINE HCL 10 MG/ML IJ SOLN
INTRAMUSCULAR | Status: DC | PRN
  Administered 2018-01-20 (×2): 100 ug via INTRAVENOUS

## 2018-01-20 NOTE — Anesthesia Preprocedure Evaluation (Signed)
Anesthesia Evaluation  Patient identified by MRN, date of birth, ID band Patient awake    Reviewed: Allergy & Precautions, H&P , NPO status , Patient's Chart, lab work & pertinent test results, reviewed documented beta blocker date and time   History of Anesthesia Complications (+) history of anesthetic complications  Airway Mallampati: II   Neck ROM: full    Dental  (+) Poor Dentition   Pulmonary neg pulmonary ROS, asthma , COPD, former smoker,    Pulmonary exam normal        Cardiovascular Exercise Tolerance: Poor hypertension, On Medications + Orthopnea  negative cardio ROS Normal cardiovascular exam+ dysrhythmias Atrial Fibrillation + Valvular Problems/Murmurs  Rhythm:regular Rate:Normal     Neuro/Psych PSYCHIATRIC DISORDERS Anxiety Depression negative neurological ROS  negative psych ROS   GI/Hepatic negative GI ROS, Neg liver ROS, hiatal hernia, PUD, GERD  Medicated,  Endo/Other  negative endocrine ROSdiabetes, Well Controlled, Type 2  Renal/GU negative Renal ROS  negative genitourinary   Musculoskeletal   Abdominal   Peds  Hematology negative hematology ROS (+) anemia ,   Anesthesia Other Findings Past Medical History: No date: Allergy     Comment:  seasonal allergies No date: Anemia No date: Anxiety 12/24/2017: Aortic stenosis     Comment:  Echo Aug 2018 No date: Arthritis No date: Asthma     Comment:  allergy induced asthma 12/12/2017: Cardiac murmur No date: Cataract No date: Clotting disorder (HCC)     Comment:  history of blood clots No date: Complication of anesthesia     Comment:  arrhythmia following colonoscopy No date: Degenerative disc disease, lumbar No date: Degenerative disc disease, lumbar No date: Depression No date: Diabetes mellitus 11/16/2015: Diabetic neuropathy (HCC) No date: Dysrhythmia No date: GERD (gastroesophageal reflux disease) No date: H/O eating disorder No date:  H/O transfusion     Comment:  [facility],               blood type O+ No date: History of chicken pox No date: History of hiatal hernia No date: History of measles, mumps, or rubella No date: HOH (hard of hearing) No date: Hyperlipidemia No date: Hypertension No date: Irregular heartbeat 12/24/2017: LVH (left ventricular hypertrophy)     Comment:  Echo Aug 2018 12/14/2014: Moderate COPD (chronic obstructive pulmonary disease) (HCC) No date: Neuropathy 11/16/2015: Opiate use No date: Orthopnea 11/16/2015: Pes planus of both feet 11/16/2015: Plantar fasciitis of right foot No date: Wheezing Past Surgical History: No date: ABDOMINAL HYSTERECTOMY No date: banck injections 05/30/2015: CATARACT EXTRACTION W/PHACO; Right     Comment:  Procedure: CATARACT EXTRACTION PHACO AND INTRAOCULAR               LENS PLACEMENT (IOC);  Surgeon: Galen ManilaWilliam Porfilio, MD;                Location: [facility] ORS;  Service: Ophthalmology;  Laterality:              Right;  US 00:57 AP% 20.9 CDE 11.99 fluid pack lot               #[id number] No date: CHOLECYSTECTOMY No date: COLON SURGERY     Comment:  blocked colon No date: EYE SURGERY 2018: FOOT FUSION; Right No date: GALLBLADDER SURGERY 2010: GASTRIC BYPASS 2016: internal bleeding No date: MOUTH SURGERY No date: OVARY SURGERY 2018: SKIN GRAFT; Right     Comment:  RT foot No date: TENOTOMY ACHILLES TENDON  Comment:  Percuntaneous No date: TONSILLECTOMY BMI    Body Mass Index:  39.69 kg/m     Reproductive/Obstetrics negative OB ROS                             Anesthesia Physical Anesthesia Plan  ASA: IV  Anesthesia Plan: General   Post-op Pain Management:    Induction:   PONV Risk Score and Plan:   Airway Management Planned:   Additional Equipment:   Intra-op Plan:   Post-operative Plan:   Informed Consent: I have reviewed the patients History and Physical, chart,  labs and discussed the procedure including the risks, benefits and alternatives for the proposed anesthesia with the patient or authorized representative who has indicated his/her understanding and acceptance.   Dental Advisory Given  Plan Discussed with: CRNA  Anesthesia Plan Comments:         Anesthesia Quick Evaluation

## 2018-01-20 NOTE — Anesthesia Post-op Follow-up Note (Signed)
Anesthesia QCDR form completed.        

## 2018-01-20 NOTE — Op Note (Signed)
[facility] Gastroenterology Patient Name: Andrea Holden Procedure Date: 01/20/2018 9:12 AM MRN: [id number] Account #: [id number] Date of Birth: 1946-06-25 Admit Type: Outpatient Age: 72 Room: [facility] ENDO ROOM 4 Gender: Female Note Status: Finalized Procedure:            Colonoscopy Indications:          High risk colon cancer surveillance: Personal history                        of colonic polyps Providers:            Midge Minium MD, MD Referring MD:         Kerman Passey (Referring MD) Medicines:            Propofol per Anesthesia Complications:        No immediate complications. Procedure:            Pre-Anesthesia Assessment:                       - Prior to the procedure, a History and Physical was                        performed, and patient medications and allergies were                        reviewed. The patient's tolerance of previous                        anesthesia was also reviewed. The risks and benefits of                        the procedure and the sedation options and risks were                        discussed with the patient. All questions were                        answered, and informed consent was obtained. Prior                        Anticoagulants: The patient has taken no previous                        anticoagulant or antiplatelet agents. ASA Grade                        Assessment: II - A patient with mild systemic disease.                        After reviewing the risks and benefits, the patient was                        deemed in satisfactory condition to undergo the                        procedure.                       After obtaining informed consent, the colonoscope was  passed under direct vision. Throughout the procedure,                        the patient's blood pressure, pulse, and oxygen                        saturations were monitored continuously. The                        Colonoscope  was introduced through the anus and                        advanced to the the cecum, identified by appendiceal                        orifice and ileocecal valve. The colonoscopy was                        performed without difficulty. The patient tolerated the                        procedure well. The quality of the bowel preparation                        was excellent. Findings:      The perianal and digital rectal examinations were normal.      Non-bleeding internal hemorrhoids were found during retroflexion. The       hemorrhoids were Grade I (internal hemorrhoids that do not prolapse).      Multiple small-mouthed diverticula were found in the sigmoid colon. Impression:           - Non-bleeding internal hemorrhoids.                       - Diverticulosis in the sigmoid colon.                       - No specimens collected. Recommendation:       - Discharge patient to home.                       - Resume previous diet.                       - Continue present medications.                       - Repeat colonoscopy in 5 years for surveillance. Procedure Code(s):    --- Professional ---                       [id number], Colonoscopy, flexible; diagnostic, including                        collection of specimen(s) by brushing or washing, when                        performed (separate procedure) Diagnosis Code(s):    --- Professional ---                       Z86.010, Personal history of colonic polyps CPT copyright 2017 American Medical Association. All rights reserved.  The codes documented in this report are preliminary and upon coder review may  be revised to meet current compliance requirements. Midge Minium MD, MD 01/20/2018 9:54:10 AM This report has been signed electronically. Number of Addenda: 0 Note Initiated On: 01/20/2018 9:12 AM Scope Withdrawal Time: 0 hours 7 minutes 4 seconds  Total Procedure Duration: 0 hours 14 minutes 18 seconds       [facility]

## 2018-01-20 NOTE — Anesthesia Postprocedure Evaluation (Signed)
Anesthesia Post Note  Patient: Andrea Holden  Procedure(s) Performed: COLONOSCOPY WITH PROPOFOL (N/A )  Patient location during evaluation: PACU Anesthesia Type: General Level of consciousness: awake and alert Pain management: pain level controlled Vital Signs Assessment: post-procedure vital signs reviewed and stable Respiratory status: spontaneous breathing, nonlabored ventilation, respiratory function stable and patient connected to nasal cannula oxygen Cardiovascular status: blood pressure returned to baseline and stable Postop Assessment: no apparent nausea or vomiting Anesthetic complications: no     Last Vitals:  Vitals:   01/20/18 1005 01/20/18 1015  BP: 119/76 (!) 147/63  Pulse: 66 68  Resp:    Temp:    SpO2: 99% 98%    Last Pain:  Vitals:   01/20/18 1015  TempSrc:   PainSc: 0-No pain                 Yevette EdwardsJames G Andreah Goheen

## 2018-01-20 NOTE — H&P (Signed)
Lucilla Lame, MD [address] [place], [place] [address] Phone:[phone] Fax : [phone]  Primary Care Physician:  Arnetha Courser, MD Primary Gastroenterologist:  Dr. Allen Norris  Pre-Procedure History & Physical: HPI:  Andrea Holden is a 72 y.o. female is here for an colonoscopy.   Past Medical History:  Diagnosis Date  . Allergy    seasonal allergies  . Anemia   . Anxiety   . Aortic stenosis 12/24/2017   Echo Aug 2018  . Arthritis   . Asthma    allergy induced asthma  . Cardiac murmur 12/12/2017  . Cataract   . Clotting disorder ([place])    history of blood clots  . Complication of anesthesia    arrhythmia following colonoscopy  . Degenerative disc disease, lumbar   . Degenerative disc disease, lumbar   . Depression   . Diabetes mellitus   . Diabetic neuropathy ([place]) 11/16/2015  . Dysrhythmia   . GERD (gastroesophageal reflux disease)   . H/O eating disorder   . H/O transfusion    patient was given 5 units of blood while at [place], blood type O+  . History of chicken pox   . History of hiatal hernia   . History of measles, mumps, or rubella   . HOH (hard of hearing)   . Hyperlipidemia   . Hypertension   . Irregular heartbeat   . LVH (left ventricular hypertrophy) 12/24/2017   Echo Aug 2018  . Moderate COPD (chronic obstructive pulmonary disease) ([place]) 12/14/2014  . Neuropathy   . Opiate use 11/16/2015  . Orthopnea   . Pes planus of both feet 11/16/2015  . Plantar fasciitis of right foot 11/16/2015  . Wheezing     Past Surgical History:  Procedure Laterality Date  . ABDOMINAL HYSTERECTOMY    . banck injections    . CATARACT EXTRACTION W/PHACO Right 05/30/2015   Procedure: CATARACT EXTRACTION PHACO AND INTRAOCULAR LENS PLACEMENT (IOC);  Surgeon: Birder Robson, MD;  Location: [facility] ORS;  Service: Ophthalmology;  Laterality: Right;  [place] 00:57 AP% 20.9 CDE 11.99 fluid pack lot #[id number]  . CHOLECYSTECTOMY    . COLON SURGERY     blocked  colon  . EYE SURGERY    . FOOT FUSION Right 2018  . GALLBLADDER SURGERY    . GASTRIC BYPASS  2010  . internal bleeding  2016  . MOUTH SURGERY    . OVARY SURGERY    . SKIN GRAFT Right 2018   RT foot  . TENOTOMY ACHILLES TENDON     Percuntaneous  . TONSILLECTOMY      Prior to Admission medications   Medication Sig Start Date End Date Taking? Authorizing Provider  acetaminophen (TYLENOL) 325 MG tablet Take 325 mg by mouth every 6 (six) hours as needed.    Yes [provider]  albuterol (PROVENTIL HFA;VENTOLIN HFA) 108 (90 Base) MCG/ACT inhaler Inhale 2 puffs into the lungs every 6 (six) hours as needed for wheezing or shortness of breath. 06/20/17  Yes Lada, Satira Anis, MD  aspirin EC 81 MG tablet Take 81 mg by mouth daily.   Yes [provider]  atorvastatin (LIPITOR) 10 MG tablet Take 1 tablet (10 mg total) by mouth at bedtime. 12/25/17  Yes Lada, Satira Anis, MD  calcium carbonate (TUMS - DOSED IN MG ELEMENTAL CALCIUM) 500 MG chewable tablet Chew 1 tablet by mouth daily.  11/04/17  Yes [provider]  co-enzyme Q-10 30 MG capsule Take 30 mg by mouth  every other day.   Yes [provider]  fluticasone (FLONASE) 50 MCG/ACT nasal spray Place 2 sprays into the nose daily as needed.   Yes [provider]  gabapentin (NEURONTIN) 400 MG capsule Take 1 capsule (400 mg total) by mouth 3 (three) times daily. 10/17/17  Yes Lada, Satira Anis, MD  HYDROcodone-acetaminophen (NORCO) 5-325 MG tablet Norco 5 mg-325 mg tablet  1 PO BID FILL ON 10/25/17  prn   Yes [provider]  HYDROcodone-acetaminophen (NORCO) 5-325 MG tablet Norco 5 mg-325 mg tablet  1 PO BID FILL ON 11/24/17   Yes [provider]  lisinopril (PRINIVIL,ZESTRIL) 10 MG tablet Take 1 tablet (10 mg total) by mouth daily. 01/13/18  Yes Lada, Satira Anis, MD  Melatonin 5 MG TABS Take 1 tablet by mouth at bedtime.  11/04/17  Yes [provider]  Multiple Vitamins-Minerals  (MULTIVITAMIN ADULTS 50+) TABS Take 1 tablet by mouth daily.  11/04/17  Yes [provider]  Omega-3 Fatty Acids (FISH OIL) 1000 MG CAPS Take 1,000 capsules by mouth every other day.   Yes [provider]  ranitidine (ZANTAC) 300 MG tablet Take 1 tablet (300 mg total) by mouth at bedtime. 10/17/17  Yes Lada, Satira Anis, MD  sertraline (ZOLOFT) 100 MG tablet Take 1.5 tablets (150 mg total) by mouth daily. 11/04/17  Yes Lada, Satira Anis, MD  Turmeric 500 MG CAPS Take 500 capsules by mouth every other day.   Yes [provider]  vitamin C (ASCORBIC ACID) 500 MG tablet Take 500 mg by mouth daily.   Yes [provider]  Na Sulfate-K Sulfate-Mg Sulf (SUPREP BOWEL PREP KIT) 17.5-3.13-1.6 GM/177ML SOLN Take 1 kit by mouth as directed. Patient not taking: Reported on 01/20/2018 01/07/18   Lucilla Lame, MD    Allergies as of 01/07/2018 - Review Complete 01/05/2018  Allergen Reaction Noted  . Meloxicam Other (See Comments) 12/12/2015  . Sitagliptin Hives and Itching 03/16/2015    Family History  Problem Relation Age of Onset  . Asthma Mother   . COPD Mother   . Arthritis Father   . Depression Father   . Heart disease Father   . Hypertension Father   . Stroke Father   . Arthritis Brother   . Depression Brother   . Diabetes Brother   . Heart disease Brother   . Hyperlipidemia Brother   . Hypertension Brother   . Stroke Brother   . Vision loss Brother   . Diabetes Maternal Grandmother   . Thyroid disease Daughter   . Colitis Daughter   . Breast cancer Neg Hx     Social History   Socioeconomic History  . Marital status: Divorced    Spouse name: Not on file  . Number of children: 1  . Years of education: Not on file  . Highest education level: Associate degree: academic program  Occupational History  . Occupation: Oceanographer    Comment: [employer]  Social Needs  . Financial resource strain: Very hard  . Food  insecurity:    Worry: Never true    Inability: Never true  . Transportation needs:    Medical: No    Non-medical: No  Tobacco Use  . Smoking status: Former Smoker    Packs/day: 1.50    Years: 55.00    Pack years: 82.50    Types: Cigarettes    Start date: 07/01/1960    Last attempt to quit: 08/26/2016    Years  since quitting: 1.4  . Smokeless tobacco: Never Used  . Tobacco comment: smoking cessation materials not required  Substance and Sexual Activity  . Alcohol use: Yes    Alcohol/week: 0.0 oz    Comment: 2 drinks per month  . Drug use: Not Currently    Types: Marijuana    Comment: used for pain  . Sexual activity: Not Currently  Lifestyle  . Physical activity:    Days per week: 0 days    Minutes per session: 0 min  . Stress: Only a little  Relationships  . Social connections:    Talks on phone: Patient refused    Gets together: Patient refused    Attends religious service: Patient refused    Active member of club or organization: Patient refused    Attends meetings of clubs or organizations: Patient refused    Relationship status: Divorced  . Intimate partner violence:    Fear of current or ex partner: No    Emotionally abused: No    Physically abused: No    Forced sexual activity: No  Other Topics Concern  . Not on file  Social History Narrative  . Not on file    Review of Systems: See HPI, otherwise negative ROS  Physical Exam: BP (!) 161/53   Pulse 61   Temp (!) 96.5 F (35.8 C) (Tympanic)   Resp 18   Ht '5\' 2"'  (1.575 m)   Wt 217 lb (98.4 kg)   SpO2 97%   BMI 39.69 kg/m  General:   Alert,  pleasant and cooperative in NAD Head:  Normocephalic and atraumatic. Neck:  Supple; no masses or thyromegaly. Lungs:  Clear throughout to auscultation.    Heart:  Regular rate and rhythm. Abdomen:  Soft, nontender and nondistended. Normal bowel sounds, without guarding, and without rebound.   Neurologic:  Alert and  oriented x4;  grossly normal  neurologically.  Impression/Plan: Andrea Holden is here for an colonoscopy to be performed for history of colon polyps  Risks, benefits, limitations, and alternatives regarding  colonoscopy have been reviewed with the patient.  Questions have been answered.  All parties agreeable.   Lucilla Lame, MD  01/20/2018, 9:15 AM

## 2018-01-20 NOTE — Transfer of Care (Signed)
Immediate Anesthesia Transfer of Care Note  Patient: Andrea Holden  Procedure(s) Performed: COLONOSCOPY WITH PROPOFOL (N/A )  Patient Location: PACU and Endoscopy Unit  Anesthesia Type:General  Level of Consciousness: drowsy  Airway & Oxygen Therapy: Patient Spontanous Breathing and Patient connected to nasal cannula oxygen  Post-op Assessment: Report given to RN and Post -op Vital signs reviewed and stable  Post vital signs: Reviewed and stable  Last Vitals:  Vitals Value Taken Time  BP    Temp    Pulse    Resp    SpO2      Last Pain:  Vitals:   01/20/18 0856  TempSrc: Tympanic  PainSc: 0-No pain         Complications: No apparent anesthesia complications

## 2018-01-21 ENCOUNTER — Encounter: Payer: Medicare HMO | Admitting: Physical Therapy

## 2018-01-22 ENCOUNTER — Encounter: Payer: Self-pay | Admitting: Gastroenterology

## 2018-01-22 ENCOUNTER — Ambulatory Visit: Payer: Medicare HMO | Admitting: Physical Therapy

## 2018-01-23 ENCOUNTER — Encounter: Payer: Medicare HMO | Attending: Family Medicine | Admitting: Dietician

## 2018-01-23 ENCOUNTER — Encounter: Payer: Self-pay | Admitting: Dietician

## 2018-01-23 VITALS — Ht 62.0 in | Wt 217.0 lb

## 2018-01-23 DIAGNOSIS — Z713 Dietary counseling and surveillance: Secondary | ICD-10-CM | POA: Insufficient documentation

## 2018-01-23 DIAGNOSIS — Z6838 Body mass index (BMI) 38.0-38.9, adult: Secondary | ICD-10-CM

## 2018-01-23 DIAGNOSIS — E119 Type 2 diabetes mellitus without complications: Secondary | ICD-10-CM | POA: Diagnosis not present

## 2018-01-23 DIAGNOSIS — E6609 Other obesity due to excess calories: Secondary | ICD-10-CM

## 2018-01-23 NOTE — Patient Instructions (Signed)
   Continue with healthy food choices and good portion control, great job!  Consider a DASH diet eating pattern for weight control (adjust portions and number of servings of carbs to decrease calories to weight loss level).

## 2018-01-23 NOTE — Progress Notes (Signed)
Medical Nutrition Therapy: Visit start time: 1005  end time: 1120  Assessment:  Diagnosis: obesity, diabetes Medical history changes: colonoscopy 12/2017, fell yesterday Psychosocial issues/ stress concerns: none  Current weight: 217.0lbs  Height: 5'2" Medications, supplement changes: reconciled list in medical record  Progress and evaluation: Patient reports now eating snack size bags of popcorn with less butter. Has switched to pre-portioned chocolate treats ie turtles to reduce portions of chocolate; increased salads and greens almonds, peanuts. She reports some soreness today after helping a friend move yesterday, and also fell yesterday while helping friend. Patient reports checking into keto diet for weight loss.   Physical activity: PT 2x a week  Dietary Intake:  Usual eating pattern includes 2 meals and 1-3 snacks per day. Dining out frequency: not assessed today.  Breakfast: oatmeal with nuts and fruit; eggs with whole grain bread; occasionally Special K Nourish cereal Snack: none, or nuts, fruit Lunch: often none-- eats more of a brunch meal in late am Snack: small portion chocolate or nuts, fruit Supper: 1 chicken taco + one without tortilla; potatoes and bratwurst; trying to have variety and lower carb or fat choices Snack: snack size popcorn with small amount added butter Beverages: water, coffee multiple cups daily with creamer added (recently regular; typically sugar free)  Nutrition Care Education: Topics covered: weight control Weight control: reviewed patient's progress since previous visit; discussed importance of following diet that allows for healthy choices and balanced meals, discussed DASH diet as a healthy option (modifying to avoid very low sodium intake) Diabetes: appropriate carb intake and balance   Nutritional Diagnosis:  [place]-2.2 Altered nutrition-related laboratory As related to diabetes.  As evidenced by patient with recent HbA1C of 6.8%. [place]-3.3  Overweight/obesity As related to history of excess calories and inactivity.  As evidenced by patient with current BMI of 39.7 making dietary changes to reduce calories .  Intervention: Discussed as noted above.   Commended patient for positive changes made.    She will plan to continue with current eating pattern for now, adopting DASH pattern as able.      Education Materials given:  Marland Kitchen. DASH diet and menus . Goals/ instructions  Learner/ who was taught:  . Patient   Level of understanding: Marland Kitchen. Verbalizes/ demonstrates competency   Demonstrated degree of understanding via:   Teach back Learning barriers: . None  Willingness to learn/ readiness for change: . Eager, change in progress  Monitoring and Evaluation:  Dietary intake, exercise, BG control, and body weight      follow up: 02/27/18

## 2018-01-26 ENCOUNTER — Encounter: Payer: Self-pay | Admitting: Physical Therapy

## 2018-01-26 ENCOUNTER — Ambulatory Visit: Payer: Medicare HMO | Admitting: Physical Therapy

## 2018-01-26 DIAGNOSIS — M6281 Muscle weakness (generalized): Secondary | ICD-10-CM

## 2018-01-26 DIAGNOSIS — M62838 Other muscle spasm: Secondary | ICD-10-CM

## 2018-01-26 DIAGNOSIS — M5441 Lumbago with sciatica, right side: Secondary | ICD-10-CM | POA: Diagnosis not present

## 2018-01-26 DIAGNOSIS — G8929 Other chronic pain: Secondary | ICD-10-CM

## 2018-01-26 NOTE — Therapy (Signed)
[place] [facility]ICINE [address], [place], [address] Phone: [phone]   Fax:  [phone]  Physical Therapy Treatment  Patient Details  Name: KIYLEE THORESON MRN: [id number] Date of Birth: Nov 23, 1945 Referring Provider: Marene Lenz DO   Encounter Date: 01/26/2018  PT End of Session - 01/26/18 1317    Visit Number  47    Number of Visits  50    Date for PT Re-Evaluation  02/02/18    Authorization Type   6 of 10 (progress)    PT Start Time  1309    PT Stop Time  1345    PT Time Calculation (min)  36 min    Activity Tolerance  Patient tolerated treatment well    Behavior During Therapy  [facility] for tasks assessed/performed       Past Medical History:  Diagnosis Date  . Allergy    seasonal allergies  . Anemia   . Anxiety   . Aortic stenosis 12/24/2017   Echo Aug 2018  . Arthritis   . Asthma    allergy induced asthma  . Cardiac murmur 12/12/2017  . Cataract   . Clotting disorder (HCC)    history of blood clots  . Complication of anesthesia    arrhythmia following colonoscopy  . Degenerative disc disease, lumbar   . Degenerative disc disease, lumbar   . Depression   . Diabetes mellitus   . Diabetic neuropathy (HCC) 11/16/2015  . Dysrhythmia   . GERD (gastroesophageal reflux disease)   . H/O eating disorder   . H/O transfusion    patient was given 5 units of blood while at [facility], blood type O+  . History of chicken pox   . History of hiatal hernia   . History of measles, mumps, or rubella   . HOH (hard of hearing)   . Hyperlipidemia   . Hypertension   . Irregular heartbeat   . LVH (left ventricular hypertrophy) 12/24/2017   Echo Aug 2018  . Moderate COPD (chronic obstructive pulmonary disease) (HCC) 12/14/2014  . Neuropathy   . Opiate use 11/16/2015  . Orthopnea   . Pes planus of both feet 11/16/2015  . Plantar fasciitis of right foot 11/16/2015  . Wheezing     Past Surgical History:   Procedure Laterality Date  . ABDOMINAL HYSTERECTOMY    . banck injections    . CATARACT EXTRACTION W/PHACO Right 05/30/2015   Procedure: CATARACT EXTRACTION PHACO AND INTRAOCULAR LENS PLACEMENT (IOC);  Surgeon: Galen Manila, MD;  Location: [facility] ORS;  Service: Ophthalmology;  Laterality: Right;  [place] 00:57 AP% 20.9 CDE 11.99 fluid pack lot #[id number]  . CHOLECYSTECTOMY    . COLON SURGERY     blocked colon  . COLONOSCOPY WITH PROPOFOL N/A 01/20/2018   Procedure: COLONOSCOPY WITH PROPOFOL;  Surgeon: Midge Minium, MD;  Location: [facility];  Service: Endoscopy;  Laterality: N/A;  . EYE SURGERY    . FOOT FUSION Right 2018  . GALLBLADDER SURGERY    . GASTRIC BYPASS  2010  . internal bleeding  2016  . MOUTH SURGERY    . OVARY SURGERY    . SKIN GRAFT Right 2018   RT foot  . TENOTOMY ACHILLES TENDON     Percuntaneous  . TONSILLECTOMY      There were no vitals filed for this visit.  Subjective Assessment - 01/26/18 1313    Subjective  Patient reports she had a fall since last visit while walking  outdoors on a path. She did not  go to doctor right away and was able to get up with assistance of a friend and reports her knees continue to bother her with bruising noted on left knee.  She was seen by her MD 01/23/18.    Pertinent History  chronic back pain and foot pain over many years since 1995 with previous physical therapy with good results. Patient reports falling 06/02/17 which caused increased back pain and she is now waing to have evaluation by spine specialist    Limitations  Sitting;Standing;Walking;House hold activities    How long can you sit comfortably?  <1 hour    How long can you stand comfortably?  <20 min    How long can you walk comfortably?  only short distances    Patient Stated Goals  Patient wants to be abe to walk more normally with less pain in back to return to normal activities     Currently in Pain?  Yes    Pain Score  6     Pain Location  Generalized    Pain  Orientation  Right;Left;Lower;Mid    Pain Descriptors / Indicators  Sore    Pain Type  Acute pain    Pain Onset  In the past 7 days    Pain Frequency  Intermittent       Objective:  Observation: ecchymosis left knee medial aspect, light in color, approximately 2" x 3".  Palpation: mild to moderate spasms palpable along upper trapezius muscles and bilateral spine to lumbar region Gait: ambulating independently with cane; mild antalgic pattern, good cadence, decreased trunk rotation   Treatment: Therapeutic exercise: patient performed with demonstration, verbal cues and assistance of therapist: goal: strenth, improved endurance, function   siting in chair: Straight arm pull downs with blue resistive band x 15 bilateral rows blue resistive band x 15 palloff press  20x to abdomen 3# weight: bilateral biceps curls x 15 scapular bent over rows bilateral x 15 raise 3# weight up overhead, core control, x 15   Manual Therapy: 15 min. Goal: spasm, pain STM performed to spine, upper trapezius to lower lumbar region right  and left superficial techniques seated in massage chair    Patient was escorted to her car with supervision for safety at end of session   Patient response to treatment: patient reported decresaed pain in back from 6/10 to 4/10 following treatment. she was able to perform all exercises with minimal cuing for correct technqiue. no incresaed pain reported with exercises. Soft tissue elasticity with decresaed spasms by 30% following STM.      PT Education - 01/26/18 1317    Education provided  Yes    Education Details  exercise instruction  for upper body/core    Person(s) Educated  Patient    Methods  Explanation;Demonstration;Verbal cues    Comprehension  Verbalized understanding;Returned demonstration;Verbal cues required          PT Long Term Goals - 12/22/17 1253      PT LONG TERM GOAL #1   Title  Patient will improve MODI score to 40% or better  demonstrating improved function with daily tasks and able to stand, sit and walk with less difficulty    Baseline  MODI 55%; 45% 09/09/17; 10/23/17 40%    Status  Achieved      PT LONG TERM GOAL #2   Title  Patient will ben independent with home program for pain control and progressive exercises for flexibility and strength  in lumbar spine/right LE  in order to transition to home program     Baseline  limited knowldge of appropriate exercises and progression without cuing, instruction    Status  On-going    Target Date  02/02/18      PT LONG TERM GOAL #3   Title  Patient will improve MODI score to 25% or better demonstrating improved function with daily tasks and able to stand, sit and walk with less difficulty    Baseline  MODI 55%; 40% 10/23/17; 12/22/2017 35%    Status  Revised    Target Date  02/02/18            Plan - 01/26/18 1359    Clinical Impression Statement Patient session limited due to arriving late and  limited exercises to core/upper body due to injury to knees from recent fall. She responded well to exercises and manual techniques today with decreased pain and spams. She should continue to improve with additional physical therapy intervention.     Rehab Potential  Fair    Clinical Impairments Affecting Rehab Potential  (+) motivated (-) muliple co morbidities, chronic condition    PT Frequency  2x / week    PT Duration  6 weeks    PT Treatment/Interventions  Electrical Stimulation;Moist Heat;Ultrasound;Patient/family education;Neuromuscular re-education;Therapeutic exercise;Manual techniques    PT Next Visit Plan  pain control, ther. ex, manual therapy    PT Home Exercise Plan  stabilization exercises; observe good body mechanics for daily tasks       Patient will benefit from skilled therapeutic intervention in order to improve the following deficits and impairments:  Decreased strength, Impaired flexibility, Decreased activity tolerance, Impaired perceived functional  ability, Pain, Decreased endurance, Difficulty walking  Visit Diagnosis: Chronic bilateral low back pain with right-sided sciatica  Muscle weakness (generalized)  Other muscle spasm     Problem List Patient Active Problem List   Diagnosis Date Noted  . Personal history of colonic polyps   . Aortic stenosis 12/24/2017  . LVH (left ventricular hypertrophy) 12/24/2017  . Hypertension with heart disease 12/24/2017  . Left atrial dilatation 12/12/2017  . Cardiac murmur 12/12/2017  . Gastrojejunal ulcer 12/04/2017  . Hyperphosphatemia 12/04/2017  . Vaginitis 12/04/2017  . Spinal stenosis of lumbar region 06/18/2017  . Degeneration of lumbar intervertebral disc 06/02/2017  . Assistance needed with transportation 05/26/2017  . Financial difficulties 05/26/2017  . Needs assistance with community resources 05/26/2017  . Moderate recurrent major depression (HCC) 05/26/2017  . Non-healing wound of lower extremity 02/06/2017  . Status post ankle arthrodesis 12/11/2016  . Controlled substance agreement broken 10/11/2016  . Low back pain 09/25/2016  . Osteoarthritis of right subtalar joint 07/11/2016  . Posterior tibial tendinitis of right lower extremity 07/11/2016  . Breast cancer screening 06/18/2016  . Knee pain 04/03/2016  . Hyponatremia 03/01/2016  . High triglycerides 12/24/2015  . Pes planus of both feet 11/16/2015  . Plantar fasciitis of right foot 11/16/2015  . Heel spur 11/16/2015  . Diabetic neuropathy (HCC) 11/16/2015  . Right ankle pain 11/16/2015  . Medication monitoring encounter 11/16/2015  . Chronic pain of multiple joints 08/15/2015  . Abnormal mammogram of right breast 06/19/2015  . Cerumen impaction 03/16/2015  . Hyperlipidemia LDL goal <100 12/14/2014  . History of transfusion of packed RBC 12/14/2014  . COPD, mild (HCC) 12/14/2014  . Hx of smoking 12/14/2014  . Status post bariatric surgery 12/14/2014  . Morbid obesity (HCC) 12/14/2014  . Chronic  radicular lumbar pain  12/14/2014  . History of GI bleed 11/12/2014  . GERD without esophagitis 11/12/2014  . History of small bowel obstruction 09/07/2014    Beacher MayBrooks, Takyla Kuchera PT 01/26/2018, 4:17 PM   [facility] [address] [place], [place], [address] Phone: [phone]   Fax:  [phone]  Name: Juliette MangleDianna J Swindler MRN: [id number] Date of Birth: 1946-01-04

## 2018-01-28 ENCOUNTER — Encounter: Payer: Medicare HMO | Admitting: Physical Therapy

## 2018-01-29 ENCOUNTER — Encounter: Payer: Self-pay | Admitting: Physical Therapy

## 2018-01-29 ENCOUNTER — Ambulatory Visit: Payer: Medicare HMO | Attending: Physical Medicine and Rehabilitation | Admitting: Physical Therapy

## 2018-01-29 DIAGNOSIS — M5440 Lumbago with sciatica, unspecified side: Secondary | ICD-10-CM | POA: Insufficient documentation

## 2018-01-29 DIAGNOSIS — M62838 Other muscle spasm: Secondary | ICD-10-CM | POA: Diagnosis not present

## 2018-01-29 DIAGNOSIS — M6281 Muscle weakness (generalized): Secondary | ICD-10-CM

## 2018-01-29 DIAGNOSIS — G8929 Other chronic pain: Secondary | ICD-10-CM

## 2018-01-29 DIAGNOSIS — M5441 Lumbago with sciatica, right side: Secondary | ICD-10-CM | POA: Diagnosis not present

## 2018-01-29 NOTE — Therapy (Signed)
[place] [facility]ICINE [address], [place], [address] Phone: [phone]   Fax:  [phone]  Physical Therapy Treatment  Patient Details  Name: Andrea Holden MRN: [id number] Date of Birth: 04/30/1946 Referring Provider: Marene Lenz DO   Encounter Date: 01/29/2018  PT End of Session - 01/29/18 1155    Visit Number  48    Number of Visits  50    Date for PT Re-Evaluation  02/02/18    Authorization Type   7 of 10 (progress)    PT Start Time  1035    PT Stop Time  1115    PT Time Calculation (min)  40 min    Activity Tolerance  Patient tolerated treatment well    Behavior During Therapy  [facility] for tasks assessed/performed       Past Medical History:  Diagnosis Date  . Allergy    seasonal allergies  . Anemia   . Anxiety   . Aortic stenosis 12/24/2017   Echo Aug 2018  . Arthritis   . Asthma    allergy induced asthma  . Cardiac murmur 12/12/2017  . Cataract   . Clotting disorder (HCC)    history of blood clots  . Complication of anesthesia    arrhythmia following colonoscopy  . Degenerative disc disease, lumbar   . Degenerative disc disease, lumbar   . Depression   . Diabetes mellitus   . Diabetic neuropathy (HCC) 11/16/2015  . Dysrhythmia   . GERD (gastroesophageal reflux disease)   . H/O eating disorder   . H/O transfusion    patient was given 5 units of blood while at [facility], blood type O+  . History of chicken pox   . History of hiatal hernia   . History of measles, mumps, or rubella   . HOH (hard of hearing)   . Hyperlipidemia   . Hypertension   . Irregular heartbeat   . LVH (left ventricular hypertrophy) 12/24/2017   Echo Aug 2018  . Moderate COPD (chronic obstructive pulmonary disease) (HCC) 12/14/2014  . Neuropathy   . Opiate use 11/16/2015  . Orthopnea   . Pes planus of both feet 11/16/2015  . Plantar fasciitis of right foot 11/16/2015  . Wheezing     Past Surgical History:   Procedure Laterality Date  . ABDOMINAL HYSTERECTOMY    . banck injections    . CATARACT EXTRACTION W/PHACO Right 05/30/2015   Procedure: CATARACT EXTRACTION PHACO AND INTRAOCULAR LENS PLACEMENT (IOC);  Surgeon: Galen Manila, MD;  Location: [facility] ORS;  Service: Ophthalmology;  Laterality: Right;  [place] 00:57 AP% 20.9 CDE 11.99 fluid pack lot #[id number]  . CHOLECYSTECTOMY    . COLON SURGERY     blocked colon  . COLONOSCOPY WITH PROPOFOL N/A 01/20/2018   Procedure: COLONOSCOPY WITH PROPOFOL;  Surgeon: Midge Minium, MD;  Location: [facility] ENDOSCOPY;  Service: Endoscopy;  Laterality: N/A;  . EYE SURGERY    . FOOT FUSION Right 2018  . GALLBLADDER SURGERY    . GASTRIC BYPASS  2010  . internal bleeding  2016  . MOUTH SURGERY    . OVARY SURGERY    . SKIN GRAFT Right 2018   RT foot  . TENOTOMY ACHILLES TENDON     Percuntaneous  . TONSILLECTOMY      There were no vitals filed for this visit.  Subjective Assessment - 01/29/18 1036    Subjective  Patient reports her left knee is better and she is having  lower back pain as chief concern today.     Pertinent History  chronic back pain and foot pain over many years since 1995 with previous physical therapy with good results. Patient reports falling 06/02/17 which caused increased back pain and she is now waing to have evaluation by spine specialist    Limitations  Sitting;Standing;Walking;House hold activities    How long can you sit comfortably?  <1 hour    How long can you stand comfortably?  <20 min    How long can you walk comfortably?  only short distances    Patient Stated Goals  Patient wants to be abe to walk more normally with less pain in back to return to normal activities     Currently in Pain?  Yes    Pain Score  5     Pain Location  Back    Pain Orientation  Lower    Pain Descriptors / Indicators  Aching    Pain Type  Acute pain    Pain Onset  More than a month ago    Pain Frequency  Intermittent          Objective:    Palpation: mild to moderate spasms palpable along right thoracic spine paraspinal muscles and left lower lumbar spine with increased tenderness near PSIS   Gait: ambulating independently with cane; mild antalgic pattern, good cadence, decreased trunk rotation   Treatment: Therapeutic exercise: patient performed with demonstration, verbal cues and assistance of therapist: goal: strenth, improved endurance, function   siting in chair: Straight arm pull downs with blue resistive band x 20 bilateral rows blue resistive band x 15 palloff press doubled blue resistive band  20x to abdomen 3# weight: bilateral biceps curls x 15 scapular bent over rows bilateral x 15 raise 3# weight up overhead, core control x 20  LE's: both LE's hip flexion without weight short arc x 10  hip abduction with manual isometric resistance moderate intensity x 10 knee flexion blue resistive band each LE 20 reps  hip abduction bilateral with blue resistive band x 20 reps   Manual Therapy: 13 min. Goal: spasm, pain STM performed to spine, upper trapezius to lower lumbar region right  and left superficial techniques seated in massage chair    Patient was escorted to her car with supervision for safety at end of session   Patient response to treatment: improved soft tissue elasticity 50% with decreased tenderness following STM. improved motor control and technique with exercises with repeated VC and demonstration for correct technique       PT Education - 01/29/18 1257    Education provided  Yes    Education Details  exercise instruction for correct position, ROM and technique    Person(s) Educated  Patient    Methods  Explanation;Demonstration;Verbal cues    Comprehension  Verbalized understanding;Returned demonstration;Verbal cues required          PT Long Term Goals - 12/22/17 1253      PT LONG TERM GOAL #1   Title  Patient will improve MODI score to 40% or better demonstrating improved function  with daily tasks and able to stand, sit and walk with less difficulty    Baseline  MODI 55%; 45% 09/09/17; 10/23/17 40%    Status  Achieved      PT LONG TERM GOAL #2   Title  Patient will ben independent with home program for pain control and progressive exercises for flexibility and strength in lumbar spine/right LE  in  order to transition to home program     Baseline  limited knowldge of appropriate exercises and progression without cuing, instruction    Status  On-going    Target Date  02/02/18      PT LONG TERM GOAL #3   Title  Patient will improve MODI score to 25% or better demonstrating improved function with daily tasks and able to stand, sit and walk with less difficulty    Baseline  MODI 55%; 40% 10/23/17; 12/22/2017 35%    Status  Revised    Target Date  02/02/18            Plan - 01/29/18 1258    Clinical Impression Statement  Patient demonstrated decreased tenderness and pain following treatment. She continues to require instruction, cuing and assistance to perform exercises with good technique.     Rehab Potential  Fair    Clinical Impairments Affecting Rehab Potential  (+) motivated (-) muliple co morbidities, chronic condition    PT Frequency  2x / week    PT Duration  6 weeks    PT Treatment/Interventions  Electrical Stimulation;Moist Heat;Ultrasound;Patient/family education;Neuromuscular re-education;Therapeutic exercise;Manual techniques    PT Next Visit Plan  pain control, ther. ex, manual therapy    PT Home Exercise Plan  stabilization exercises; observe good body mechanics for daily tasks; UE and LE exercises for strength, stabilization       Patient will benefit from skilled therapeutic intervention in order to improve the following deficits and impairments:  Decreased strength, Impaired flexibility, Decreased activity tolerance, Impaired perceived functional ability, Pain, Decreased endurance, Difficulty walking  Visit Diagnosis: Chronic bilateral low back  pain with right-sided sciatica  Muscle weakness (generalized)  Other muscle spasm  Chronic bilateral low back pain with sciatica, sciatica laterality unspecified     Problem List Patient Active Problem List   Diagnosis Date Noted  . Personal history of colonic polyps   . Aortic stenosis 12/24/2017  . LVH (left ventricular hypertrophy) 12/24/2017  . Hypertension with heart disease 12/24/2017  . Left atrial dilatation 12/12/2017  . Cardiac murmur 12/12/2017  . Gastrojejunal ulcer 12/04/2017  . Hyperphosphatemia 12/04/2017  . Vaginitis 12/04/2017  . Spinal stenosis of lumbar region 06/18/2017  . Degeneration of lumbar intervertebral disc 06/02/2017  . Assistance needed with transportation 05/26/2017  . Financial difficulties 05/26/2017  . Needs assistance with community resources 05/26/2017  . Moderate recurrent major depression (HCC) 05/26/2017  . Non-healing wound of lower extremity 02/06/2017  . Status post ankle arthrodesis 12/11/2016  . Controlled substance agreement broken 10/11/2016  . Low back pain 09/25/2016  . Osteoarthritis of right subtalar joint 07/11/2016  . Posterior tibial tendinitis of right lower extremity 07/11/2016  . Breast cancer screening 06/18/2016  . Knee pain 04/03/2016  . Hyponatremia 03/01/2016  . High triglycerides 12/24/2015  . Pes planus of both feet 11/16/2015  . Plantar fasciitis of right foot 11/16/2015  . Heel spur 11/16/2015  . Diabetic neuropathy (HCC) 11/16/2015  . Right ankle pain 11/16/2015  . Medication monitoring encounter 11/16/2015  . Chronic pain of multiple joints 08/15/2015  . Abnormal mammogram of right breast 06/19/2015  . Cerumen impaction 03/16/2015  . Hyperlipidemia LDL goal <100 12/14/2014  . History of transfusion of packed RBC 12/14/2014  . COPD, mild (HCC) 12/14/2014  . Hx of smoking 12/14/2014  . Status post bariatric surgery 12/14/2014  . Morbid obesity (HCC) 12/14/2014  . Chronic radicular lumbar pain  12/14/2014  . History of GI bleed 11/12/2014  . GERD  without esophagitis 11/12/2014  . History of small bowel obstruction 09/07/2014    Beacher MayBrooks, Alto Gandolfo PT 01/29/2018, 1:00 PM  [place] [facility] [address] [place], [place], [address] Phone: [phone]   Fax:  [phone]  Name: Andrea Holden MRN: [id number] Date of Birth: July 17, 1945

## 2018-02-02 ENCOUNTER — Ambulatory Visit: Payer: Medicare HMO | Admitting: Physical Therapy

## 2018-02-04 ENCOUNTER — Ambulatory Visit: Payer: Medicare HMO | Admitting: Physical Therapy

## 2018-02-04 DIAGNOSIS — M47816 Spondylosis without myelopathy or radiculopathy, lumbar region: Secondary | ICD-10-CM | POA: Diagnosis not present

## 2018-02-05 ENCOUNTER — Ambulatory Visit: Payer: Medicare HMO | Admitting: Physical Therapy

## 2018-02-06 ENCOUNTER — Other Ambulatory Visit: Payer: Self-pay | Admitting: Family Medicine

## 2018-02-09 ENCOUNTER — Ambulatory Visit: Payer: Medicare HMO | Admitting: Physical Therapy

## 2018-02-11 ENCOUNTER — Ambulatory Visit: Payer: Medicare HMO | Admitting: Physical Therapy

## 2018-02-12 ENCOUNTER — Ambulatory Visit: Payer: Medicare HMO | Admitting: Physical Therapy

## 2018-02-16 ENCOUNTER — Other Ambulatory Visit: Payer: Self-pay | Admitting: Family Medicine

## 2018-02-16 ENCOUNTER — Ambulatory Visit: Payer: Medicare HMO | Admitting: Physical Therapy

## 2018-02-19 ENCOUNTER — Encounter: Payer: Medicare HMO | Admitting: Physical Therapy

## 2018-02-27 ENCOUNTER — Ambulatory Visit: Payer: Medicare HMO | Admitting: Dietician

## 2018-03-03 ENCOUNTER — Encounter: Payer: Medicare HMO | Admitting: Physical Therapy

## 2018-03-05 ENCOUNTER — Encounter: Payer: Medicare HMO | Admitting: Physical Therapy

## 2018-03-05 ENCOUNTER — Encounter: Payer: Self-pay | Admitting: Dietician

## 2018-03-05 NOTE — Progress Notes (Signed)
Sent discharge letter to referring provider, as patient cancelled her follow-up appointment for 02/27/18 due to lack of insurance coverage.

## 2018-03-09 ENCOUNTER — Encounter: Payer: Medicare HMO | Admitting: Physical Therapy

## 2018-03-12 ENCOUNTER — Encounter: Payer: Medicare HMO | Admitting: Physical Therapy

## 2018-03-16 ENCOUNTER — Encounter: Payer: Medicare HMO | Admitting: Physical Therapy

## 2018-03-19 ENCOUNTER — Encounter: Payer: Medicare HMO | Admitting: Physical Therapy

## 2018-03-23 ENCOUNTER — Encounter: Payer: Medicare HMO | Admitting: Physical Therapy

## 2018-03-26 ENCOUNTER — Encounter: Payer: Medicare HMO | Admitting: Physical Therapy

## 2018-03-30 ENCOUNTER — Encounter: Payer: Medicare HMO | Admitting: Physical Therapy

## 2018-03-31 ENCOUNTER — Ambulatory Visit: Payer: Medicare HMO | Admitting: Family Medicine

## 2018-04-03 ENCOUNTER — Other Ambulatory Visit: Payer: Self-pay | Admitting: Family Medicine

## 2018-04-23 ENCOUNTER — Telehealth: Payer: Self-pay | Admitting: Family Medicine

## 2018-04-23 ENCOUNTER — Encounter: Payer: Self-pay | Admitting: Family Medicine

## 2018-04-23 ENCOUNTER — Ambulatory Visit (INDEPENDENT_AMBULATORY_CARE_PROVIDER_SITE_OTHER): Payer: Medicare HMO | Admitting: Family Medicine

## 2018-04-23 VITALS — BP 124/70 | HR 86 | Temp 97.9°F | Ht 62.0 in | Wt 206.1 lb

## 2018-04-23 DIAGNOSIS — E669 Obesity, unspecified: Secondary | ICD-10-CM | POA: Diagnosis not present

## 2018-04-23 DIAGNOSIS — R011 Cardiac murmur, unspecified: Secondary | ICD-10-CM

## 2018-04-23 DIAGNOSIS — E782 Mixed hyperlipidemia: Secondary | ICD-10-CM | POA: Diagnosis not present

## 2018-04-23 DIAGNOSIS — E785 Hyperlipidemia, unspecified: Secondary | ICD-10-CM

## 2018-04-23 DIAGNOSIS — I350 Nonrheumatic aortic (valve) stenosis: Secondary | ICD-10-CM

## 2018-04-23 DIAGNOSIS — E781 Pure hyperglyceridemia: Secondary | ICD-10-CM

## 2018-04-23 DIAGNOSIS — Z5181 Encounter for therapeutic drug level monitoring: Secondary | ICD-10-CM | POA: Diagnosis not present

## 2018-04-23 DIAGNOSIS — E1169 Type 2 diabetes mellitus with other specified complication: Secondary | ICD-10-CM

## 2018-04-23 MED ORDER — ATORVASTATIN CALCIUM 10 MG PO TABS: 10.0000 mg | ORAL_TABLET | Freq: Every day | ORAL | 5 refills | Status: DC

## 2018-04-23 MED ORDER — VARENICLINE TARTRATE 1 MG PO TABS: 1.0000 mg | ORAL_TABLET | Freq: Two times a day (BID) | ORAL | 2 refills | Status: DC

## 2018-04-23 MED ORDER — VARENICLINE TARTRATE 0.5 MG X 11 & 1 MG X 42 PO MISC: ORAL | 0 refills | Status: DC

## 2018-04-23 NOTE — Assessment & Plan Note (Signed)
Praise given for weight loss 

## 2018-04-23 NOTE — Telephone Encounter (Signed)
Copied from CRM [id number]. Topic: Quick Communication - See Telephone Encounter >> Apr 23, 2018  2:29 PM Jens Som A wrote: CRM for notification. See Telephone encounter for: 04/23/18.  Patient wanted to let Dr. Sherie Don know that she has an appt with the caridologist on 07/06/18

## 2018-04-23 NOTE — Progress Notes (Signed)
BP 124/70   Pulse 86   Temp 97.9 F (36.6 C)   Ht 5\' 2"  (1.575 m)   Wt 206 lb 1.6 oz (93.5 kg)   SpO2 99%   BMI 37.70 kg/m    Subjective:    Patient ID: Andrea Holden, female    DOB: [birth year], 73 y.o.   MRN: [id number]  HPI: Andrea Holden is a 72 y.o. female  Chief Complaint  Patient presents with  . Follow-up    HPI Here for fu  Type 2 diabetes; no sugary drinks; does like chocolate; numbness in the right great toe, more surgery issue than actual diabetes  Lab Results  Component Value Date   HGBA1C 6.8 (H) 04/23/2018   Obesity; praise given for 11 pounds down since last visit; struggling with drinking enough water  HTN; well-controlled; checking BP at home and getting 120s and occasionally 130; does add salt with my blessing  High cholesterol; not taking atorvastatin; girlfriend is a PhD and told her to stop taking it; patient is afraid to take it now; she worries about her heart; she has a murmur and that worries her; she was worried about her kidneys She feels like she is losing oxygen to her legs; legs start shaking after a short time; up and down; it's been a month and a half since taking any statin  Depression screen [facility] 2/9 04/23/2018 12/12/2017 10/22/2017 10/17/2017 09/23/2017  Decreased Interest 0 0 1 1 0  Down, Depressed, Hopeless 1 0 1 1 1   PHQ - 2 Score 1 0 2 2 1   Altered sleeping 0 1 - 2 -  Tired, decreased energy 1 1 - 2 -  Change in appetite 0 0 - 2 -  Feeling bad or failure about yourself  0 0 - 1 -  Trouble concentrating 0 0 - 3 -  Moving slowly or fidgety/restless 0 0 - 0 -  Suicidal thoughts 0 0 - 0 -  PHQ-9 Score 2 2 - 12 -  Difficult doing work/chores Somewhat difficult Not difficult at all - Somewhat difficult -  Some recent data might be hidden   Fall Risk  04/23/2018 01/23/2018 12/12/2017 11/05/2017 10/22/2017  Falls in the past year? Yes Yes Yes Yes Yes  Comment - - - no falls since 09/2017 visit -  Number falls in past yr: 2 or  more 1 1 - 1  Injury with Fall? No (No Data) Yes - No  Comment - bruising on knee and mild abrasion - - -  Risk for fall due to : - History of fall(s) - - History of fall(s);Impaired balance/gait;Impaired vision  Risk for fall due to: Comment - - - - -  Follow up - - - - -    Relevant past medical, surgical, family and social history reviewed Past Medical History:  Diagnosis Date  . Allergy    seasonal allergies  . Anemia   . Anxiety   . Aortic stenosis 12/24/2017   Echo Aug 2018  . Arthritis   . Asthma    allergy induced asthma  . Cardiac murmur 12/12/2017  . Cataract   . Clotting disorder (HCC)    history of blood clots  . Complication of anesthesia    arrhythmia following colonoscopy  . Degenerative disc disease, lumbar   . Degenerative disc disease, lumbar   . Depression   . Diabetes mellitus   . Diabetic neuropathy (HCC) 11/16/2015  . Dysrhythmia   . GERD (gastroesophageal reflux disease)   .  H/O eating disorder   . H/O transfusion    patient was given 5 units of blood while at [facility], blood type O+  . History of chicken pox   . History of hiatal hernia   . History of measles, mumps, or rubella   . HOH (hard of hearing)   . Hyperlipidemia   . Hypertension   . Irregular heartbeat   . LVH (left ventricular hypertrophy) 12/24/2017   Echo Aug 2018  . Moderate COPD (chronic obstructive pulmonary disease) (HCC) 12/14/2014  . Neuropathy   . Opiate use 11/16/2015  . Orthopnea   . Pes planus of both feet 11/16/2015  . Plantar fasciitis of right foot 11/16/2015  . Wheezing    Past Surgical History:  Procedure Laterality Date  . ABDOMINAL HYSTERECTOMY    . banck injections    . CATARACT EXTRACTION W/PHACO Right 05/30/2015   Procedure: CATARACT EXTRACTION PHACO AND INTRAOCULAR LENS PLACEMENT (IOC);  Surgeon: Galen Manila, MD;  Location: [facility] ORS;  Service: Ophthalmology;  Laterality: Right;  [place] 00:57 AP% 20.9 CDE 11.99 fluid pack lot #[id number]  . CHOLECYSTECTOMY     . COLON SURGERY     blocked colon  . COLONOSCOPY WITH PROPOFOL N/A 01/20/2018   Procedure: COLONOSCOPY WITH PROPOFOL;  Surgeon: Midge Minium, MD;  Location: [facility] ENDOSCOPY;  Service: Endoscopy;  Laterality: N/A;  . EYE SURGERY    . FOOT FUSION Right 2018  . GALLBLADDER SURGERY    . GASTRIC BYPASS  2010  . internal bleeding  2016  . MOUTH SURGERY    . OVARY SURGERY    . SKIN GRAFT Right 2018   RT foot  . TENOTOMY ACHILLES TENDON     Percuntaneous  . TONSILLECTOMY     Family History  Problem Relation Age of Onset  . Asthma Mother   . COPD Mother   . Arthritis Father   . Depression Father   . Heart disease Father   . Hypertension Father   . Stroke Father   . Arthritis Brother   . Depression Brother   . Diabetes Brother   . Heart disease Brother   . Hyperlipidemia Brother   . Hypertension Brother   . Stroke Brother   . Vision loss Brother   . Diabetes Maternal Grandmother   . Thyroid disease Daughter   . Colitis Daughter   . Breast cancer Neg Hx    Social History   Tobacco Use  . Smoking status: Current Every Day Smoker    Packs/day: 0.50    Years: 55.00    Pack years: 27.50    Types: Cigarettes    Start date: 07/01/1960    Last attempt to quit: 08/26/2016    Years since quitting: 1.6  . Smokeless tobacco: Never Used  . Tobacco comment: smoking cessation materials not required  Substance Use Topics  . Alcohol use: Yes    Alcohol/week: 0.0 standard drinks    Comment: 2 drinks per month  . Drug use: Not Currently    Types: Marijuana    Comment: used for pain     [facility] Visit from 04/23/2018 in [facility]  AUDIT-C Score  1     Interim medical history since last visit reviewed. Allergies and medications reviewed  Review of Systems Per HPI unless specifically indicated above     Objective:    BP 124/70   Pulse 86   Temp 97.9 F (36.6 C)   Ht 5\' 2"  (1.575 m)   Wt  206 lb 1.6 oz (93.5 kg)   SpO2 99%   BMI 37.70 kg/m   Wt  Readings from Last 3 Encounters:  04/23/18 206 lb 1.6 oz (93.5 kg)  01/23/18 217 lb (98.4 kg)  01/20/18 217 lb (98.4 kg)    Physical Exam  Constitutional: She appears well-developed and well-nourished. No distress.  Weight loss 11 pounds over 3 months acknowledged  HENT:  Head: Normocephalic and atraumatic.  Eyes: EOM are normal. No scleral icterus.  Neck: No thyromegaly present.  Cardiovascular: Normal rate, regular rhythm and normal heart sounds.  No murmur heard. Pulmonary/Chest: Effort normal and breath sounds normal. No respiratory distress. She has no wheezes.  Abdominal: Soft. Bowel sounds are normal. She exhibits no distension.  Musculoskeletal: She exhibits no edema.  Neurological: She is alert.  Skin: Skin is warm and dry. She is not diaphoretic. No pallor.  Psychiatric: She has a normal mood and affect. Her behavior is normal. Judgment and thought content normal.      Assessment & Plan:   Problem List Items Addressed This Visit      Cardiovascular and Mediastinum   Aortic stenosis (Chronic)   Relevant Medications   atorvastatin (LIPITOR) 10 MG tablet   Other Relevant Orders   Ambulatory referral to Cardiology     Other   Medication monitoring encounter   Relevant Orders   COMPLETE METABOLIC PANEL WITH GFR (Completed)   CBC with Differential/Platelet (Completed)   High triglycerides   Relevant Medications   atorvastatin (LIPITOR) 10 MG tablet   Cardiac murmur   Relevant Orders   Ambulatory referral to Cardiology   Hyperlipidemia LDL goal <100 (Chronic)   Relevant Medications   atorvastatin (LIPITOR) 10 MG tablet   Morbid obesity (HCC) (Chronic)    Praise given for weight loss       Other Visit Diagnoses    Diabetes mellitus type 2 in obese (HCC)    -  Primary   Relevant Medications   atorvastatin (LIPITOR) 10 MG tablet   Other Relevant Orders   Hemoglobin A1c (Completed)   US ARTERIAL ABI (SCREENING LOWER EXTREMITY) (Completed)   Dyslipidemia with  low high density lipoprotein (HDL) cholesterol with hypertriglyceridemia due to type 2 diabetes mellitus (HCC)       Relevant Medications   atorvastatin (LIPITOR) 10 MG tablet       Follow up plan: Return in about 3 months (around 07/24/2018) for follow-up visit with Dr. Sherie Don (or just AFTER).  An after-visit summary was printed and given to the patient at check-out.  Please see the patient instructions which may contain other information and recommendations beyond what is mentioned above in the assessment and plan.  Meds ordered this encounter  Medications  . atorvastatin (LIPITOR) 10 MG tablet    Sig: Take 1 tablet (10 mg total) by mouth at bedtime.    Dispense:  30 tablet    Refill:  5  . varenicline (CHANTIX STARTING MONTH PAK) 0.5 MG X 11 & 1 MG X 42 tablet    Sig: Take one 0.5 mg tablet by mouth once daily for 3 days, then increase to one 0.5 mg tablet twice daily for 4 days, then increase to one 1 mg tablet twice daily.    Dispense:  53 tablet    Refill:  0  . varenicline (CHANTIX CONTINUING MONTH PAK) 1 MG tablet    Sig: Take 1 tablet (1 mg total) by mouth 2 (two) times daily. ** FILL AFTER THE STARTER MONTH PAK **  Dispense:  60 tablet    Refill:  2    Orders Placed This Encounter  Procedures  . US ARTERIAL ABI (SCREENING LOWER EXTREMITY)  . COMPLETE METABOLIC PANEL WITH GFR  . Hemoglobin A1c  . CBC with Differential/Platelet  . Ambulatory referral to Cardiology

## 2018-04-23 NOTE — Patient Instructions (Signed)
Keep up the great job with your weight loss Check out the information at familydoctor.org entitled "Nutrition for Weight Loss: What You Need to Know about Fad Diets" Try to lose between 1-2 pounds per week by taking in fewer calories and burning off more calories You can succeed by limiting portions, limiting foods dense in calories and fat, becoming more active, and drinking 8 glasses of water a day (64 ounces) Don't skip meals, especially breakfast, as skipping meals may alter your metabolism Do not use over-the-counter weight loss pills or gimmicks that claim rapid weight loss A healthy BMI (or body mass index) is between 18.5 and 24.9 You can calculate your ideal BMI at the NIH website [employer]  We'll have you see the cardiologist We'll get labs today If you have not heard anything from my staff in a week about any orders/referrals/studies from today, please contact us here to follow-up [phone]

## 2018-04-24 ENCOUNTER — Telehealth: Payer: Self-pay | Admitting: Family Medicine

## 2018-04-24 LAB — COMPLETE METABOLIC PANEL WITH GFR
AG Ratio: 1.6 (calc) (ref 1.0–2.5)
ALT: 8 U/L (ref 6–29)
AST: 13 U/L (ref 10–35)
Albumin: 4 g/dL (ref 3.6–5.1)
Alkaline phosphatase (APISO): 74 U/L (ref 33–130)
BUN: 17 mg/dL (ref 7–25)
CO2: 24 mmol/L (ref 20–32)
Calcium: 9.2 mg/dL (ref 8.6–10.4)
Chloride: 105 mmol/L (ref 98–110)
Creat: 0.85 mg/dL (ref 0.60–0.93)
GFR, Est African American: 80 mL/min/{1.73_m2} (ref >=60)
GFR, Est Non African American: 69 mL/min/{1.73_m2} (ref >=60)
Globulin: 2.5 g/dL (calc) (ref 1.9–3.7)
Glucose, Bld: 138 mg/dL (ref 65–139)
Potassium: 4.9 mmol/L (ref 3.5–5.3)
Sodium: 138 mmol/L (ref 135–146)
Total Bilirubin: 0.3 mg/dL (ref 0.2–1.2)
Total Protein: 6.5 g/dL (ref 6.1–8.1)

## 2018-04-24 LAB — CBC WITH DIFFERENTIAL/PLATELET
Basophils Absolute: 83 cells/uL (ref 0–200)
Basophils Relative: 0.9 %
Eosinophils Absolute: 202 cells/uL (ref 15–500)
Eosinophils Relative: 2.2 %
HCT: 39.7 % (ref 35.0–45.0)
Hemoglobin: 13.1 g/dL (ref 11.7–15.5)
Lymphs Abs: 1205 cells/uL (ref 850–3900)
MCH: 27.5 pg (ref 27.0–33.0)
MCHC: 33 g/dL (ref 32.0–36.0)
MCV: 83.4 fL (ref 80.0–100.0)
MPV: 9.5 fL (ref 7.5–12.5)
Monocytes Relative: 7 %
Neutro Abs: 7066 cells/uL (ref 1500–7800)
Neutrophils Relative %: 76.8 %
Platelets: 202 10*3/uL (ref 140–400)
RBC: 4.76 10*6/uL (ref 3.80–5.10)
RDW: 14.1 % (ref 11.0–15.0)
Total Lymphocyte: 13.1 %
WBC mixed population: 644 cells/uL (ref 200–950)
WBC: 9.2 10*3/uL (ref 3.8–10.8)

## 2018-04-24 LAB — HEMOGLOBIN A1C
Hgb A1c MFr Bld: 6.8 % of total Hgb — ABNORMAL HIGH (ref <5.7)
Mean Plasma Glucose: 148 (calc)
eAG (mmol/L): 8.2 (calc)

## 2018-04-24 NOTE — Telephone Encounter (Signed)
Charted in result notes. 

## 2018-04-24 NOTE — Telephone Encounter (Signed)
Copied from CRM [id number]. Topic: General - Other >> Apr 24, 2018 11:37 AM Gerrianne Scale wrote: Reason for CRM: pt calling back about lab results

## 2018-04-28 ENCOUNTER — Ambulatory Visit
Admission: RE | Admit: 2018-04-28 | Discharge: 2018-04-28 | Disposition: A | Discharge status: Home / Self Care | Payer: Medicare HMO | Source: Ambulatory Visit | Attending: Family Medicine | Admitting: Family Medicine

## 2018-04-28 DIAGNOSIS — I739 Peripheral vascular disease, unspecified: Secondary | ICD-10-CM | POA: Diagnosis not present

## 2018-04-28 DIAGNOSIS — E1151 Type 2 diabetes mellitus with diabetic peripheral angiopathy without gangrene: Secondary | ICD-10-CM | POA: Insufficient documentation

## 2018-04-28 DIAGNOSIS — E1169 Type 2 diabetes mellitus with other specified complication: Secondary | ICD-10-CM | POA: Diagnosis not present

## 2018-04-28 DIAGNOSIS — E669 Obesity, unspecified: Secondary | ICD-10-CM | POA: Insufficient documentation

## 2018-05-06 ENCOUNTER — Telehealth: Payer: Self-pay

## 2018-05-06 ENCOUNTER — Encounter: Payer: Self-pay | Admitting: Family Medicine

## 2018-05-06 DIAGNOSIS — I739 Peripheral vascular disease, unspecified: Secondary | ICD-10-CM

## 2018-05-06 DIAGNOSIS — E782 Mixed hyperlipidemia: Secondary | ICD-10-CM

## 2018-05-06 DIAGNOSIS — E1169 Type 2 diabetes mellitus with other specified complication: Secondary | ICD-10-CM

## 2018-05-06 HISTORY — DX: Peripheral vascular disease, unspecified: I73.9

## 2018-05-06 NOTE — Telephone Encounter (Signed)
-----   Message from Melinda P Lada, MD sent at 05/06/2018  3:31 PM EST ----- Asti Mackley, please let the patient know that she does appear to have blockages in the arteries in her legs causing decreased circulation; I'm so glad we got this test; please REFER high priority to vascular surgeon for bilateral peripheral artery disease I looked at her last lipid panel; her LDL is too high, so I'm going to recommend that she take the atorvastatin every night, really work on healthy diet and weight loss, and have those lipids rechecked 6 weeks after she started (please ORDER) 

## 2018-05-06 NOTE — Telephone Encounter (Signed)
-----   Message from Kerman Passey, MD sent at 05/06/2018  3:31 PM EST ----- Asher Muir, please let the patient know that she does appear to have blockages in the arteries in her legs causing decreased circulation; I'm so glad we got this test; please REFER high priority to vascular surgeon for bilateral peripheral artery disease I looked at her last lipid panel; her LDL is too high, so I'm going to recommend that she take the atorvastatin every night, really work on healthy diet and weight loss, and have those lipids rechecked 6 weeks after she started (please ORDER)

## 2018-05-07 ENCOUNTER — Telehealth: Payer: Self-pay | Admitting: Family Medicine

## 2018-05-07 MED ORDER — GABAPENTIN 400 MG PO CAPS: 400.0000 mg | ORAL_CAPSULE | Freq: Two times a day (BID) | ORAL | 2 refills | Status: DC

## 2018-05-07 NOTE — Telephone Encounter (Signed)
Refill request:  Medication: gabapentin (NEURONTIN) 400 MG capsule    [facility] - [place] - [address]          [phone] (Phone) [phone] (Fax)

## 2018-05-07 NOTE — Telephone Encounter (Signed)
Patient reported taking gabapentin just twice a day at last visit This correlates with prescription fill dates Rx sent

## 2018-05-07 NOTE — Telephone Encounter (Addendum)
4:58pm-- Patient calling to check the status of getting the medication that Dr Sherie Don was supposed to send to the pharmacy. Asked patient if it was regarding the gabapentin and she states no, she does not know what medication that it is, but she needs it because she is leaving out of town tomorrow. Tried to call [facility] and there was no answer. Advised that I would send a message.   5:12pm -- After looking at med list with Nurse Triage Carollee Herter, discovered that the medication that Dr Sherie Don wanted her to take (atorvastatin) had already been sent to her pharmacy on 04/23/18. Called patient back and left voice mail to check with the pharmacy and see if that medication was ready for pick up.

## 2018-05-07 NOTE — Telephone Encounter (Signed)
p pec trCopied from CRM [id number]. Topic: General - Other >> May 07, 2018  9:52 AM Ronney Lion A wrote: Medication: gabapentin (NEURONTIN) 400 MG capsule   Has the patient contacted their pharmacy? Yes   Preferred Pharmacy (with phone number or street name): [facility] - [place], [place] - [address]  [phone] (Phone) [phone] (Fax)    Agent: Please be advised that RX refills may take up to 3 business days. We ask that you follow-up with your pharmacy.

## 2018-05-15 ENCOUNTER — Other Ambulatory Visit: Payer: Self-pay | Admitting: Family Medicine

## 2018-05-15 MED ORDER — ALBUTEROL SULFATE HFA 108 (90 BASE) MCG/ACT IN AERS: 2.0000 | INHALATION_SPRAY | Freq: Four times a day (QID) | RESPIRATORY_TRACT | 1 refills | Status: DC | PRN

## 2018-05-15 NOTE — Telephone Encounter (Signed)
Copied from CRM [id number]. Topic: Quick Communication - See Telephone Encounter >> May 15, 2018 10:52 AM Waymon AmatoBurton, Donna F wrote: Pt is needing a refill on her albuteral inhaler   [facility]  [phone]

## 2018-05-15 NOTE — Addendum Note (Signed)
Addended by: LADA, Janit BernMELINDA P on: 05/15/2018 01:15 PM   Modules accepted: Orders

## 2018-05-15 NOTE — Addendum Note (Signed)
Addended by: Edie Darley C on: 05/15/2018 12:31 PM   Modules accepted: Orders  

## 2018-05-19 ENCOUNTER — Ambulatory Visit (INDEPENDENT_AMBULATORY_CARE_PROVIDER_SITE_OTHER): Payer: Medicare HMO | Admitting: Vascular Surgery

## 2018-05-19 ENCOUNTER — Encounter (INDEPENDENT_AMBULATORY_CARE_PROVIDER_SITE_OTHER): Payer: Self-pay | Admitting: Vascular Surgery

## 2018-05-19 VITALS — BP 192/66 | HR 81 | Resp 16 | Ht 62.0 in | Wt 210.6 lb

## 2018-05-19 DIAGNOSIS — Z87891 Personal history of nicotine dependence: Secondary | ICD-10-CM

## 2018-05-19 DIAGNOSIS — I70219 Atherosclerosis of native arteries of extremities with intermittent claudication, unspecified extremity: Secondary | ICD-10-CM | POA: Insufficient documentation

## 2018-05-19 DIAGNOSIS — I10 Essential (primary) hypertension: Secondary | ICD-10-CM

## 2018-05-19 DIAGNOSIS — I70213 Atherosclerosis of native arteries of extremities with intermittent claudication, bilateral legs: Secondary | ICD-10-CM | POA: Diagnosis not present

## 2018-05-19 DIAGNOSIS — E785 Hyperlipidemia, unspecified: Secondary | ICD-10-CM

## 2018-05-19 DIAGNOSIS — E119 Type 2 diabetes mellitus without complications: Secondary | ICD-10-CM | POA: Diagnosis not present

## 2018-05-19 NOTE — Assessment & Plan Note (Signed)
Says she has basically quit but does occasionally smoke.  Discussed that this was a major risk factor for the development of atherosclerotic peripheral arterial disease and cessation would be of benefit.

## 2018-05-19 NOTE — Assessment & Plan Note (Signed)
blood pressure control important in reducing the progression of atherosclerotic disease. On appropriate oral medications.  

## 2018-05-19 NOTE — Assessment & Plan Note (Signed)
lipid control important in reducing the progression of atherosclerotic disease. Continue statin therapy  

## 2018-05-19 NOTE — Assessment & Plan Note (Signed)
blood glucose control important in reducing the progression of atherosclerotic disease. Also, involved in wound healing. On appropriate medications.  

## 2018-05-19 NOTE — Patient Instructions (Signed)

## 2018-05-19 NOTE — Progress Notes (Signed)
Patient ID: Andrea Holden, female   DOB: June 29, 1946, 72 y.o.   MRN: [id number]  Chief Complaint  Patient presents with  . New Patient (Initial Visit)    ref Lada for PAD    HPI Andrea Holden is a 72 y.o. female.  I am asked to see the patient by DR. Lada for evaluation of PAD.  The patient reports pain and cramping in her legs.  Her legs tire easily even with minimal activity.  She reports both legs to be affected.  She had a major reconstructive surgery on the right foot and ankle last year and has had walking difficulties since then.  She reports no open wounds or ulcers.  She denies any fevers or chills.  As part of her work-up, her primary care physician ordered noninvasive studies which I have reviewed.  She has significantly reduced ABIs bilaterally in the 0.6 range on the right and the 0.5 range on the left.  Her waveforms are monophasic.  This is consistent with moderate to severe arterial insufficiency bilaterally.  She reports no clear inciting event or causative factor that started to cause the pain and weakness in her legs.  She says it is a little hard to determine when it started because of all of her foot and ankle issues.   Past Medical History:  Diagnosis Date  . Allergy    seasonal allergies  . Anemia   . Anxiety   . Aortic stenosis 12/24/2017   Echo Aug 2018  . Arthritis   . Asthma    allergy induced asthma  . Cardiac murmur 12/12/2017  . Cataract   . Clotting disorder (HCC)    history of blood clots  . Complication of anesthesia    arrhythmia following colonoscopy  . Degenerative disc disease, lumbar   . Degenerative disc disease, lumbar   . Depression   . Diabetes mellitus   . Diabetic neuropathy (HCC) 11/16/2015  . Dysrhythmia   . GERD (gastroesophageal reflux disease)   . H/O eating disorder   . H/O transfusion    patient was given 5 units of blood while at [facility], blood type O+  . History of chicken pox   . History of hiatal hernia   .  History of measles, mumps, or rubella   . HOH (hard of hearing)   . Hyperlipidemia   . Hypertension   . Irregular heartbeat   . LVH (left ventricular hypertrophy) 12/24/2017   Echo Aug 2018  . Moderate COPD (chronic obstructive pulmonary disease) (HCC) 12/14/2014  . Neuropathy   . Opiate use 11/16/2015  . Orthopnea   . Peripheral arterial disease (HCC) 05/06/2018   At rest, left > right; refer to vasc  . Pes planus of both feet 11/16/2015  . Plantar fasciitis of right foot 11/16/2015  . Wheezing     Past Surgical History:  Procedure Laterality Date  . ABDOMINAL HYSTERECTOMY    . banck injections    . CATARACT EXTRACTION W/PHACO Right 05/30/2015   Procedure: CATARACT EXTRACTION PHACO AND INTRAOCULAR LENS PLACEMENT (IOC);  Surgeon: Galen Manila, MD;  Location: [facility] ORS;  Service: Ophthalmology;  Laterality: Right;  [place] 00:57 AP% 20.9 CDE 11.99 fluid pack lot #[id number]  . CHOLECYSTECTOMY    . COLON SURGERY     blocked colon  . COLONOSCOPY WITH PROPOFOL N/A 01/20/2018   Procedure: COLONOSCOPY WITH PROPOFOL;  Surgeon: Midge Minium, MD;  Location: [facility] ENDOSCOPY;  Service: Endoscopy;  Laterality: N/A;  . EYE  SURGERY    . FOOT FUSION Right 2018  . GALLBLADDER SURGERY    . GASTRIC BYPASS  2010  . internal bleeding  2016  . MOUTH SURGERY    . OVARY SURGERY    . SKIN GRAFT Right 2018   RT foot  . TENOTOMY ACHILLES TENDON     Percuntaneous  . TONSILLECTOMY      Family History  Problem Relation Age of Onset  . Asthma Mother   . COPD Mother   . Arthritis Father   . Depression Father   . Heart disease Father   . Hypertension Father   . Stroke Father   . Arthritis Brother   . Depression Brother   . Diabetes Brother   . Heart disease Brother   . Hyperlipidemia Brother   . Hypertension Brother   . Stroke Brother   . Vision loss Brother   . Diabetes Maternal Grandmother   . Thyroid disease Daughter   . Colitis Daughter   . Breast cancer Neg Hx     Social History Social  History   Tobacco Use  . Smoking status: Current Every Day Smoker    Packs/day: 0.50    Years: 55.00    Pack years: 27.50    Types: Cigarettes    Start date: 07/01/1960    Last attempt to quit: 08/26/2016    Years since quitting: 1.7  . Smokeless tobacco: Never Used  . Tobacco comment: smoking cessation materials not required  Substance Use Topics  . Alcohol use: Yes    Alcohol/week: 0.0 standard drinks    Comment: 2 drinks per month  . Drug use: Not Currently    Types: Marijuana    Comment: used for pain    Allergies  Allergen Reactions  . Meloxicam Other (See Comments)    GI bleeding  . Sitagliptin Hives and Itching    Current Outpatient Medications  Medication Sig Dispense Refill  . acetaminophen (TYLENOL) 325 MG tablet Take 325 mg by mouth every 6 (six) hours as needed.     Marland Kitchen albuterol (PROVENTIL HFA;VENTOLIN HFA) 108 (90 Base) MCG/ACT inhaler Inhale 2 puffs into the lungs every 6 (six) hours as needed for wheezing or shortness of breath. 1 Inhaler 1  . aspirin EC 81 MG tablet Take 81 mg by mouth daily.    Marland Kitchen atorvastatin (LIPITOR) 10 MG tablet Take 1 tablet (10 mg total) by mouth at bedtime. 30 tablet 5  . calcium carbonate (TUMS - DOSED IN MG ELEMENTAL CALCIUM) 500 MG chewable tablet Chew 1 tablet by mouth daily.     Marland Kitchen co-enzyme Q-10 30 MG capsule Take 30 mg by mouth every other day.    . fluticasone (FLONASE) 50 MCG/ACT nasal spray Place 2 sprays into the nose daily as needed.    . gabapentin (NEURONTIN) 400 MG capsule Take 1 capsule (400 mg total) by mouth 2 (two) times daily. 60 capsule 2  . HYDROcodone-acetaminophen (NORCO) 5-325 MG tablet 0.5 tablets.     Marland Kitchen HYDROcodone-acetaminophen (NORCO) 5-325 MG tablet Norco 5 mg-325 mg tablet  1 PO BID FILL ON 11/24/17    . lisinopril (PRINIVIL,ZESTRIL) 10 MG tablet Take 1 tablet (10 mg total) by mouth daily. 90 tablet 1  . Melatonin 5 MG TABS Take 1 tablet by mouth at bedtime.     . Multiple Vitamins-Minerals (MULTIVITAMIN  ADULTS 50+) TABS Take 1 tablet by mouth daily.     . Omega-3 Fatty Acids (FISH OIL) 1000 MG CAPS Take 1,000 capsules  by mouth every other day.    . pneumococcal 13-valent conjugate vaccine (PREVNAR 13) SUSP injection Prevnar 13 (PF) 0.5 mL intramuscular syringe    . ranitidine (ZANTAC) 300 MG tablet Take 1 tablet (300 mg total) by mouth at bedtime. 90 tablet 1  . sertraline (ZOLOFT) 100 MG tablet TAKE 1 AND 1/2 TABLETS BY MOUTH DAILY FOR A TOTAL OF 150 MG 135 tablet 1  . Turmeric 500 MG CAPS Take 500 capsules by mouth every other day.    . varenicline (CHANTIX CONTINUING MONTH PAK) 1 MG tablet Take 1 tablet (1 mg total) by mouth 2 (two) times daily. ** FILL AFTER THE STARTER MONTH PAK ** 60 tablet 2  . varenicline (CHANTIX STARTING MONTH PAK) 0.5 MG X 11 & 1 MG X 42 tablet Take one 0.5 mg tablet by mouth once daily for 3 days, then increase to one 0.5 mg tablet twice daily for 4 days, then increase to one 1 mg tablet twice daily. 53 tablet 0  . vitamin C (ASCORBIC ACID) 500 MG tablet Take 500 mg by mouth every other day.      No current facility-administered medications for this visit.       REVIEW OF SYSTEMS (Negative unless checked)  Constitutional: [] Weight loss  [] Fever  [] Chills Cardiac: [] Chest pain   [] Chest pressure   [] Palpitations   [] Shortness of breath when laying flat   [] Shortness of breath at rest   [] Shortness of breath with exertion. Vascular:  [x] Pain in legs with walking   [] Pain in legs at rest   [] Pain in legs when laying flat   [x] Claudication   [] Pain in feet when walking  [] Pain in feet at rest  [] Pain in feet when laying flat   [] History of DVT   [] Phlebitis   [x] Swelling in legs   [] Varicose veins   [] Non-healing ulcers Pulmonary:   [] Uses home oxygen   [] Productive cough   [] Hemoptysis   [] Wheeze  [] COPD   [] Asthma Neurologic:  [] Dizziness  [] Blackouts   [] Seizures   [] History of stroke   [] History of TIA  [] Aphasia   [] Temporary blindness   [] Dysphagia   [] Weakness or  numbness in arms   [x] Weakness or numbness in legs Musculoskeletal:  [x] Arthritis   [] Joint swelling   [x] Joint pain   [] Low back pain Hematologic:  [] Easy bruising  [] Easy bleeding   [] Hypercoagulable state   [] Anemic  [] Hepatitis Gastrointestinal:  [] Blood in stool   [] Vomiting blood  [] Gastroesophageal reflux/heartburn   [] Abdominal pain Genitourinary:  [] Chronic kidney disease   [] Difficult urination  [] Frequent urination  [] Burning with urination   [] Hematuria Skin:  [] Rashes   [] Ulcers   [] Wounds Psychological:  [] History of anxiety   []  History of major depression.    Physical Exam BP (!) 192/66 (BP Location: Right Arm)   Pulse 81   Resp 16   Ht 5\' 2"  (1.575 m)   Wt 210 lb 9.6 oz (95.5 kg)   BMI 38.52 kg/m  Gen:  WD/WN, NAD Head: [place]/AT, No temporalis wasting.  Ear/Nose/Throat: Hearing grossly intact, nares w/o erythema or drainage, oropharynx w/o Erythema/Exudate Eyes: Conjunctiva clear, sclera non-icteric  Neck: trachea midline.   Pulmonary:  Good air movement, respirations not labored, no use of accessory muscles Cardiac: RRR, no JVD Vascular:  Vessel Right Left  Radial Palpable Palpable                          PT Not Palpable  trace Palpable  DP 1+ Palpable Not Palpable   Gastrointestinal: soft, non-tender/non-distended.  Musculoskeletal: M/S 5/5 throughout.  Mild bilateral lower extremity edema.  Walks with a cane.  Right foot deformity. Neurologic: Sensation grossly intact in extremities.  Symmetrical.  Speech is fluent. Motor exam as listed above. Psychiatric: Judgment intact, Mood & affect appropriate for pt's clinical situation. Dermatologic: No rashes or ulcers noted.  No cellulitis or open wounds.    Radiology US Arterial Vanice Sarah (screening Lower Extremity)  Result Date: 04/28/2018 CLINICAL DATA:  Bilateral lower extremity claudication. Diabetes, hyperlipidemia, previous tobacco abuse. EXAM: NONINVASIVE PHYSIOLOGIC VASCULAR STUDY OF BILATERAL LOWER  EXTREMITIES TECHNIQUE: Evaluation of both lower extremities were performed at rest, including calculation of ankle-brachial indices with single level Doppler, pressure and pulse volume recording. COMPARISON:  None. FINDINGS: Right ABI:  0.68 Left ABI:  0.51 Right Lower Extremity:  Monophasic distal waveforms Left Lower Extremity:  Monophasic distal waveforms IMPRESSION: 1. Moderate bilateral lower extremity peripheral arterial occlusive disease at rest, left worse than right. Should the patient fail conservative treatment, consider CTA runoff (higher spatial resolution) or MRA runoff (no radiation risk, can be performed noncontrast in the setting of renal dysfunction) to better define the site and nature of arterial occlusive disease and delineate treatment options. Electronically Signed   By: Corlis Leak M.D.   On: 04/28/2018 15:56    Labs Recent Results (from the past 2160 hour(s))  COMPLETE METABOLIC PANEL WITH GFR     Status: None   Collection Time: 04/23/18 12:04 PM  Result Value Ref Range   Glucose, Bld 138 65 - 139 mg/dL    Comment: .        Non-fasting reference interval .    BUN 17 7 - 25 mg/dL   Creat 1.61 0.96 - 0.45 mg/dL    Comment: For patients >38 years of age, the reference limit for Creatinine is approximately 13% higher for people identified as African-American. .    GFR, Est Non African American 69 > OR = 60 mL/min/1.26m2   GFR, Est African American 80 > OR = 60 mL/min/1.58m2   BUN/Creatinine Ratio NOT APPLICABLE 6 - 22 (calc)   Sodium 138 135 - 146 mmol/L   Potassium 4.9 3.5 - 5.3 mmol/L   Chloride 105 98 - 110 mmol/L   CO2 24 20 - 32 mmol/L   Calcium 9.2 8.6 - 10.4 mg/dL   Total Protein 6.5 6.1 - 8.1 g/dL   Albumin 4.0 3.6 - 5.1 g/dL   Globulin 2.5 1.9 - 3.7 g/dL (calc)   AG Ratio 1.6 1.0 - 2.5 (calc)   Total Bilirubin 0.3 0.2 - 1.2 mg/dL   Alkaline phosphatase (APISO) 74 33 - 130 U/L   AST 13 10 - 35 U/L   ALT 8 6 - 29 U/L  Hemoglobin A1c     Status: Abnormal     Collection Time: 04/23/18 12:04 PM  Result Value Ref Range   Hgb A1c MFr Bld 6.8 (H) <5.7 % of total Hgb    Comment: For someone without known diabetes, a hemoglobin A1c value of 6.5% or greater indicates that they may have  diabetes and this should be confirmed with a follow-up  test. . For someone with known diabetes, a value <7% indicates  that their diabetes is well controlled and a value  greater than or equal to 7% indicates suboptimal  control. A1c targets should be individualized based on  duration of diabetes, age, comorbid conditions, and  other considerations. Marland Kitchen  Currently, no consensus exists regarding use of hemoglobin A1c for diagnosis of diabetes for children. .    Mean Plasma Glucose 148 (calc)   eAG (mmol/L) 8.2 (calc)  CBC with Differential/Platelet     Status: None   Collection Time: 04/23/18 12:04 PM  Result Value Ref Range   WBC 9.2 3.8 - 10.8 Thousand/uL   RBC 4.76 3.80 - 5.10 Million/uL   Hemoglobin 13.1 11.7 - 15.5 g/dL   HCT 09.839.7 11.935.0 - 14.745.0 %   MCV 83.4 80.0 - 100.0 fL   MCH 27.5 27.0 - 33.0 pg   MCHC 33.0 32.0 - 36.0 g/dL   RDW 82.914.1 56.211.0 - 13.015.0 %   Platelets 202 140 - 400 Thousand/uL   MPV 9.5 7.5 - 12.5 fL   Neutro Abs 7,066 1,500 - 7,800 cells/uL   Lymphs Abs 1,205 850 - 3,900 cells/uL   WBC mixed population 644 200 - 950 cells/uL   Eosinophils Absolute 202 15 - 500 cells/uL   Basophils Absolute 83 0 - 200 cells/uL   Neutrophils Relative % 76.8 %   Total Lymphocyte 13.1 %   Monocytes Relative 7.0 %   Eosinophils Relative 2.2 %   Basophils Relative 0.9 %    Assessment/Plan:  Hypertension goal BP (blood pressure) < 140/90 blood pressure control important in reducing the progression of atherosclerotic disease. On appropriate oral medications.   Diabetes mellitus type II, controlled, with no complications (HCC) blood glucose control important in reducing the progression of atherosclerotic disease. Also, involved in wound healing. On  appropriate medications.   Hyperlipidemia LDL goal <100 lipid control important in reducing the progression of atherosclerotic disease. Continue statin therapy   Hx of smoking Says she has basically quit but does occasionally smoke.  Discussed that this was a major risk factor for the development of atherosclerotic peripheral arterial disease and cessation would be of benefit.  Atherosclerosis of native arteries of extremity with intermittent claudication (HCC) She has significantly reduced ABIs bilaterally in the 0.6 range on the right and the 0.5 range on the left.  Her waveforms are monophasic.  This is consistent with moderate to severe arterial insufficiency bilaterally  Recommend:  The patient has experienced increased symptoms and is now describing lifestyle limiting claudication and mild rest pain.   Given the severity of the patient's lower extremity symptoms the patient should undergo angiography and intervention.  Risk and benefits were reviewed the patient.  Indications for the procedure were reviewed.  All questions were answered, the patient agrees to proceed.   The patient should continue walking and begin a more formal exercise program.  The patient should continue antiplatelet therapy and aggressive treatment of the lipid abnormalities  The patient will follow up with me after the angiogram.       Festus BarrenJason Asahd Can 05/19/2018, 11:50 AM   This note was created with Dragon medical transcription system.  Any errors from dictation are unintentional.

## 2018-05-19 NOTE — Assessment & Plan Note (Signed)
She has significantly reduced ABIs bilaterally in the 0.6 range on the right and the 0.5 range on the left.  Her waveforms are monophasic.  This is consistent with moderate to severe arterial insufficiency bilaterally  Recommend:  The patient has experienced increased symptoms and is now describing lifestyle limiting claudication and mild rest pain.   Given the severity of the patient's lower extremity symptoms the patient should undergo angiography and intervention.  Risk and benefits were reviewed the patient.  Indications for the procedure were reviewed.  All questions were answered, the patient agrees to proceed.   The patient should continue walking and begin a more formal exercise program.  The patient should continue antiplatelet therapy and aggressive treatment of the lipid abnormalities  The patient will follow up with me after the angiogram.

## 2018-05-20 ENCOUNTER — Encounter (INDEPENDENT_AMBULATORY_CARE_PROVIDER_SITE_OTHER): Payer: Self-pay

## 2018-05-25 ENCOUNTER — Other Ambulatory Visit (INDEPENDENT_AMBULATORY_CARE_PROVIDER_SITE_OTHER): Payer: Self-pay | Admitting: Nurse Practitioner

## 2018-05-26 ENCOUNTER — Encounter
Admission: RE | Admit: 2018-05-26 | Discharge: 2018-05-26 | Disposition: A | Discharge status: Home / Self Care | Payer: Medicare HMO | Source: Ambulatory Visit | Attending: Vascular Surgery | Admitting: Vascular Surgery

## 2018-05-26 DIAGNOSIS — I70219 Atherosclerosis of native arteries of extremities with intermittent claudication, unspecified extremity: Secondary | ICD-10-CM | POA: Diagnosis not present

## 2018-05-26 DIAGNOSIS — Z01812 Encounter for preprocedural laboratory examination: Secondary | ICD-10-CM

## 2018-05-26 LAB — CREATININE, SERUM
Creatinine, Ser: 0.77 mg/dL (ref 0.44–1.00)
GFR calc Af Amer: >60 mL/min (ref >60)
GFR calc non Af Amer: >60 mL/min (ref >60)

## 2018-05-26 LAB — BUN: BUN: 19 mg/dL (ref 8–23)

## 2018-05-26 MED ORDER — DEXTROSE 5 % IV SOLN
2.0000 g | Freq: Once | INTRAVENOUS | Status: DC
  Filled 2018-05-26: qty 20

## 2018-05-27 ENCOUNTER — Encounter
Admission: RE | Disposition: A | Discharge status: Home / Self Care | Payer: Self-pay | Source: Ambulatory Visit | Attending: Vascular Surgery

## 2018-05-27 ENCOUNTER — Other Ambulatory Visit: Payer: Self-pay

## 2018-05-27 ENCOUNTER — Ambulatory Visit
Admission: RE | Admit: 2018-05-27 | Discharge: 2018-05-27 | Disposition: A | Discharge status: Home / Self Care | Payer: Medicare HMO | Source: Ambulatory Visit | Attending: Vascular Surgery | Admitting: Vascular Surgery

## 2018-05-27 ENCOUNTER — Encounter: Payer: Self-pay | Admitting: *Deleted

## 2018-05-27 DIAGNOSIS — I70213 Atherosclerosis of native arteries of extremities with intermittent claudication, bilateral legs: Secondary | ICD-10-CM | POA: Diagnosis not present

## 2018-05-27 DIAGNOSIS — I70219 Atherosclerosis of native arteries of extremities with intermittent claudication, unspecified extremity: Secondary | ICD-10-CM | POA: Insufficient documentation

## 2018-05-27 HISTORY — PX: LOWER EXTREMITY ANGIOGRAPHY: CATH118251

## 2018-05-27 SURGERY — LOWER EXTREMITY ANGIOGRAPHY
Anesthesia: Moderate Sedation | Laterality: Left

## 2018-05-27 MED ORDER — HEPARIN SODIUM (PORCINE) 1000 UNIT/ML IJ SOLN
INTRAMUSCULAR | Status: AC
  Filled 2018-05-27: qty 1

## 2018-05-27 MED ORDER — CEFAZOLIN SODIUM-DEXTROSE 2-4 GM/100ML-% IV SOLN
INTRAVENOUS | Status: AC
  Administered 2018-05-27: 15:00:00
  Filled 2018-05-27: qty 100

## 2018-05-27 MED ORDER — SODIUM CHLORIDE 0.9 % IV SOLN
INTRAVENOUS | Status: DC
  Administered 2018-05-27: 15:00:00 via INTRAVENOUS

## 2018-05-27 MED ORDER — IOPAMIDOL (ISOVUE-300) INJECTION 61%
INTRAVENOUS | Status: DC | PRN
  Administered 2018-05-27: 85 mL via INTRA_ARTERIAL

## 2018-05-27 MED ORDER — MIDAZOLAM HCL 5 MG/5ML IJ SOLN
INTRAMUSCULAR | Status: AC
  Filled 2018-05-27: qty 5

## 2018-05-27 MED ORDER — LABETALOL HCL 5 MG/ML IV SOLN: 10.0000 mg | INTRAVENOUS | Status: DC | PRN

## 2018-05-27 MED ORDER — BACITRACIN-NEOMYCIN-POLYMYXIN 400-5-5000 EX OINT
TOPICAL_OINTMENT | CUTANEOUS | Status: AC
  Filled 2018-05-27: qty 1

## 2018-05-27 MED ORDER — MIDAZOLAM HCL 2 MG/2ML IJ SOLN
INTRAMUSCULAR | Status: DC | PRN
  Administered 2018-05-27: 1 mg via INTRAVENOUS
  Administered 2018-05-27 (×2): 2 mg via INTRAVENOUS

## 2018-05-27 MED ORDER — HYDRALAZINE HCL 20 MG/ML IJ SOLN: 5.0000 mg | INTRAMUSCULAR | Status: DC | PRN

## 2018-05-27 MED ORDER — ONDANSETRON HCL 4 MG/2ML IJ SOLN: 4.0000 mg | Freq: Four times a day (QID) | INTRAMUSCULAR | Status: DC | PRN

## 2018-05-27 MED ORDER — ACETAMINOPHEN 325 MG PO TABS: 650.0000 mg | ORAL_TABLET | ORAL | Status: DC | PRN

## 2018-05-27 MED ORDER — SODIUM CHLORIDE 0.9 % IV SOLN: 250.0000 mL | INTRAVENOUS | Status: DC | PRN

## 2018-05-27 MED ORDER — HYDROMORPHONE HCL 1 MG/ML IJ SOLN: 1.0000 mg | Freq: Once | INTRAMUSCULAR | Status: DC | PRN

## 2018-05-27 MED ORDER — HEPARIN (PORCINE) IN NACL 1000-0.9 UT/500ML-% IV SOLN
INTRAVENOUS | Status: AC
  Filled 2018-05-27: qty 1000

## 2018-05-27 MED ORDER — HEPARIN SODIUM (PORCINE) 1000 UNIT/ML IJ SOLN
INTRAMUSCULAR | Status: DC | PRN
  Administered 2018-05-27: 5000 [IU] via INTRAVENOUS

## 2018-05-27 MED ORDER — LIDOCAINE-EPINEPHRINE (PF) 1 %-1:200000 IJ SOLN
INTRAMUSCULAR | Status: AC
  Filled 2018-05-27: qty 10

## 2018-05-27 MED ORDER — SODIUM CHLORIDE 0.9 % IV SOLN: INTRAVENOUS | Status: DC

## 2018-05-27 MED ORDER — SODIUM CHLORIDE 0.9% FLUSH: 3.0000 mL | Freq: Two times a day (BID) | INTRAVENOUS | Status: DC

## 2018-05-27 MED ORDER — FENTANYL CITRATE (PF) 100 MCG/2ML IJ SOLN
INTRAMUSCULAR | Status: AC
  Filled 2018-05-27: qty 2

## 2018-05-27 MED ORDER — FENTANYL CITRATE (PF) 100 MCG/2ML IJ SOLN
INTRAMUSCULAR | Status: DC | PRN
  Administered 2018-05-27 (×3): 50 ug via INTRAVENOUS

## 2018-05-27 MED ORDER — SODIUM CHLORIDE 0.9% FLUSH: 3.0000 mL | INTRAVENOUS | Status: DC | PRN

## 2018-05-27 SURGICAL SUPPLY — 16 items
BALLN LUTONIX DCB 5X40X130 (BALLOONS) ×3
BALLOON LUTONIX DCB 5X40X130 (BALLOONS) ×1 IMPLANT
CATH BEACON 5 .038 100 VERT TP (CATHETERS) ×3 IMPLANT
CATH CXI 4F 90 DAV (CATHETERS) ×3 IMPLANT
CATH PIG 70CM (CATHETERS) ×3 IMPLANT
DEVICE PRESTO INFLATION (MISCELLANEOUS) ×3 IMPLANT
DEVICE STARCLOSE SE CLOSURE (Vascular Products) ×3 IMPLANT
DEVICE TORQUE .025-.038 (MISCELLANEOUS) ×3 IMPLANT
GLIDECATH ANGL TAPER 5F (CATHETERS) ×3 IMPLANT
GLIDEWIRE ADV .035X260CM (WIRE) ×3 IMPLANT
PACK ANGIOGRAPHY (CUSTOM PROCEDURE TRAY) ×3 IMPLANT
SHEATH ANL2 6FRX45 HC (SHEATH) ×3 IMPLANT
SHEATH BRITE TIP 5FRX11 (SHEATH) ×3 IMPLANT
SYR MEDRAD MARK V 150ML (SYRINGE) ×3 IMPLANT
TUBING CONTRAST HIGH PRESS 72 (TUBING) ×3 IMPLANT
WIRE J 3MM .035X145CM (WIRE) ×3 IMPLANT

## 2018-05-27 NOTE — H&P (Signed)
[facility] History & Physical Update  The patient was interviewed and re-examined.  The patient's previous History and Physical has been reviewed and is unchanged.  There is no change in the plan of care. We plan to proceed with the scheduled procedure.  Festus BarrenJason Shelva Hetzer, MD  05/27/2018, 2:18 PM

## 2018-05-27 NOTE — Op Note (Signed)
[facility]  Percutaneous Study/Intervention Procedural Note   Date of Surgery: 05/27/2018  Surgeon(s):DEW,JASON    Assistants:none  Pre-operative Diagnosis: PAD with claudication bilateral lower extremities  Post-operative diagnosis:  Same  Procedure(s) Performed:             1.  Ultrasound guidance for vascular access right femoral artery             2.  Catheter placement into left common femoral artery from right femoral approach             3.  Aortogram and selective left lower extremity angiogram and right lower extremity angiogram             4.  Percutaneous transluminal angioplasty of left common femoral artery with 5 mm diameter by 4 cm length Lutonix drug-coated angioplasty balloon             5.  StarClose closure device right femoral artery  EBL: 10 cc  Contrast: 85 cc  Fluoro Time: 17 minutes  Moderate Conscious Sedation Time: approximately 60 minutes using 5 mg of Versed and 150 Mcg of Fentanyl              Indications:  Patient is a 72 y.o.female with short distance claudication bilaterally. The patient has noninvasive study showing significantly reduced ABIs in the 0.5-0.6 range bilaterally. The patient is brought in for angiography for further evaluation and potential treatment. Risks and benefits are discussed and informed consent is obtained.   Procedure:  The patient was identified and appropriate procedural time out was performed.  The patient was then placed supine on the table and prepped and draped in the usual sterile fashion. Moderate conscious sedation was administered during a face to face encounter with the patient throughout the procedure with my supervision of the RN administering medicines and monitoring the patient's vital signs, pulse oximetry, telemetry and mental status throughout from the start of the procedure until the patient was taken to the recovery room. Ultrasound was used to evaluate the right common femoral  artery.  It was patent .  A digital ultrasound image was acquired.  A Seldinger needle was used to access the right common femoral artery under direct ultrasound guidance and a permanent image was performed.  A 0.035 J wire was advanced without resistance and a 5Fr sheath was placed.  Pigtail catheter was placed into the aorta and an AP aortogram was performed. This demonstrated that the renal arteries were normal bilaterally.  The aorta was normal.  The left common iliac artery had about a 30% stenosis but no other significant aorta or iliac artery stenosis was identified. . I then crossed the aortic bifurcation and advanced to the left femoral head. Selective left lower extremity angiogram was then performed. This demonstrated nearly occlusive highly calcific stenosis of the left common femoral artery of >90%.  The disease extended into the origins of both the profunda femoris and the superficial femoral artery although the profunda femoris artery disease was mild.  The superficial femoral artery was occluded in the proximal segment.  It reconstituted in the proximal to mid SFA although there was still some disease in the mid to distal SFA of greater than 70%.  The popliteal artery was relatively normal.  Tibioperoneal trunk had about an 80 to 90% stenosis with the peroneal artery distally.  The anterior tibial artery was also patent without an obvious focal stenosis.  It was felt that it was in the patient's  best interest to attempt to proceed with intervention after these images to avoid a second procedure and a larger amount of contrast and fluoroscopy based off of the findings from the initial angiogram. The patient was systemically heparinized and a 6 [place] Ansell sheath was then placed over the [employer] wire. I then used a Kumpe catheter and the advantage wire to navigate through the common femoral lesion and initially get the wire into the profunda femoris artery.  Navigating into the SFA was very  difficult due to the angle and the high-grade calcific lesion in the common femoral artery.  I elected to treat the common femoral stenosis gently with a 5 mm diameter by 40 cm length Lutonix drug-coated angioplasty balloon inflated to 10 atm for 1 minute.  There was still about a 70 to 80% residual stenosis.  Despite this, getting into the SFA and remaining luminal was exceedingly difficult and crossing the very calcific occlusion could not be done despite multiple attempts with different catheters and the advantage wire.  With her significant common femoral lesion, she is likely going to need surgery to correct this and so I elected to stop trying to intervene and cross the occlusion when it was clear this was not going to be successful. I elected to terminate the procedure.  She did have right lower extremity claudication as well so the sheath was pulled back to the ipsilateral external iliac artery and right lower extremity angiography was performed.  This demonstrated an 80 to 90% stenosis in the proximal superficial femoral artery.  The common femoral artery disease was fairly mild.  The mid SFA was occluded and reconstituted at Hunter's canal.  Again, there appeared to be stenosis in the tibioperoneal trunk although it did not opacify well and was difficult to discern how much.  The anterior tibial artery appeared to be patent distally as well.  At this point, the sheath was removed and StarClose closure device was deployed in the right femoral artery with excellent hemostatic result. The patient was taken to the recovery room in stable condition having tolerated the procedure well.  Findings:               Aortogram:  The renal arteries were normal bilaterally.  The aorta was normal.  The left common iliac artery had about a 30% stenosis but no other significant aorta or iliac artery stenosis was identified.             Left lower Extremity:  This demonstrated nearly occlusive highly calcific stenosis of  the left common femoral artery of >90%.  The disease extended into the origins of both the profunda femoris and the superficial femoral artery although the profunda femoris artery disease was mild.  The superficial femoral artery was occluded in the proximal segment.  It reconstituted in the proximal to mid SFA although there was still some disease in the mid to distal SFA of greater than 70%.  The popliteal artery was relatively normal.  Tibioperoneal trunk had about an 80 to 90% stenosis with the peroneal artery distally.  The anterior tibial artery was also patent without an obvious focal stenosis.  Right lower extremity: There was about an 80 to 90% stenosis in the proximal superficial femoral artery.  The common femoral artery disease was fairly mild.  The mid SFA was occluded and reconstituted at Hunter's canal.  Again, there appeared to be stenosis in the tibioperoneal trunk although it did not opacify well and was difficult to discern  how much.  The anterior tibial artery appeared to be patent distally as well.   Disposition: Patient was taken to the recovery room in stable condition having tolerated the procedure well.  Complications: None  Leotis Pain 05/27/2018 4:01 PM   This note was created with Dragon Medical transcription system. Any errors in dictation are purely unintentional.

## 2018-06-01 ENCOUNTER — Encounter: Payer: Self-pay | Admitting: Vascular Surgery

## 2018-06-01 ENCOUNTER — Telehealth: Payer: Self-pay | Admitting: Cardiovascular Disease

## 2018-06-01 NOTE — Telephone Encounter (Signed)
° °  [facility] Pre-operative Risk Assessment    Request for surgical clearance:  1. What type of surgery is being performed? Lt femoral endarterectomy lt sfa stent placement   2. When is this surgery scheduled? Pending   3. What type of clearance is required (medical clearance vs. Pharmacy clearance to hold med vs. Both)?  medical  4. Are there any medications that need to be held prior to surgery and how long? Not noted   5. Practice name and name of physician performing surgery?  [facility] Dr. Lucky Cowboy   6. What is your office phone number [phone]   7.   What is your office fax number [phone]   8.   Anesthesia type (None, local, MAC, general) ? Not noted    Clarisse Gouge 06/01/2018, 3:41 PM  _________________________________________________________________   (provider comments below)

## 2018-06-01 NOTE — Telephone Encounter (Signed)
Patient has not been seen in our office before. Will need appointment.

## 2018-06-02 ENCOUNTER — Ambulatory Visit (INDEPENDENT_AMBULATORY_CARE_PROVIDER_SITE_OTHER): Payer: Medicare HMO | Admitting: Cardiovascular Disease

## 2018-06-02 ENCOUNTER — Encounter: Payer: Self-pay | Admitting: Cardiovascular Disease

## 2018-06-02 VITALS — BP 157/77 | HR 72 | Ht 62.0 in | Wt 209.5 lb

## 2018-06-02 DIAGNOSIS — J449 Chronic obstructive pulmonary disease, unspecified: Secondary | ICD-10-CM | POA: Diagnosis not present

## 2018-06-02 DIAGNOSIS — I70213 Atherosclerosis of native arteries of extremities with intermittent claudication, bilateral legs: Secondary | ICD-10-CM

## 2018-06-02 DIAGNOSIS — E782 Mixed hyperlipidemia: Secondary | ICD-10-CM | POA: Diagnosis not present

## 2018-06-02 DIAGNOSIS — I739 Peripheral vascular disease, unspecified: Secondary | ICD-10-CM | POA: Diagnosis not present

## 2018-06-02 DIAGNOSIS — Z87891 Personal history of nicotine dependence: Secondary | ICD-10-CM

## 2018-06-02 DIAGNOSIS — I25118 Atherosclerotic heart disease of native coronary artery with other forms of angina pectoris: Secondary | ICD-10-CM | POA: Insufficient documentation

## 2018-06-02 DIAGNOSIS — R0602 Shortness of breath: Secondary | ICD-10-CM | POA: Diagnosis not present

## 2018-06-02 DIAGNOSIS — I350 Nonrheumatic aortic (valve) stenosis: Secondary | ICD-10-CM

## 2018-06-02 DIAGNOSIS — E1142 Type 2 diabetes mellitus with diabetic polyneuropathy: Secondary | ICD-10-CM | POA: Diagnosis not present

## 2018-06-02 DIAGNOSIS — I2510 Atherosclerotic heart disease of native coronary artery without angina pectoris: Secondary | ICD-10-CM | POA: Insufficient documentation

## 2018-06-02 DIAGNOSIS — R0989 Other specified symptoms and signs involving the circulatory and respiratory systems: Secondary | ICD-10-CM

## 2018-06-02 NOTE — Patient Instructions (Addendum)
Medication Instructions:   No medication changes   If you need a refill on your cardiac medications before your next appointment, please call your pharmacy.    Lab work: No new labs needed   If you have labs (blood work) drawn today and your tests are completely normal, you will receive your results only by: Marland Kitchen MyChart Message (if you have MyChart) OR . A paper copy in the mail If you have any lab test that is abnormal or we need to change your treatment, we will call you to review the results.   Testing/Procedures: We will schedule a lexiscan [facility] MYOVIEW  Your caregiver has ordered a Stress Test with nuclear imaging. The purpose of this test is to evaluate the blood supply to your heart muscle. This procedure is referred to as a "Non-Invasive Stress Test." This is because other than having an IV started in your vein, nothing is inserted or "invades" your body. Cardiac stress tests are done to find areas of poor blood flow to the heart by determining the extent of coronary artery disease (CAD). Some patients exercise on a treadmill, which naturally increases the blood flow to your heart, while others who are  unable to walk on a treadmill due to physical limitations have a pharmacologic/chemical stress agent called Lexiscan . This medicine will mimic walking on a treadmill by temporarily increasing your coronary blood flow.   Please note: these test may take anywhere between 2-4 hours to complete  PLEASE REPORT TO [facility]  THE VOLUNTEERS AT THE FIRST DESK WILL DIRECT YOU WHERE TO GO  Date of Procedure:_____________________________________  Arrival Time for Procedure:______________________________    PLEASE NOTIFY THE OFFICE AT LEAST 24 HOURS IN ADVANCE IF YOU ARE UNABLE TO KEEP YOUR APPOINTMENT.  [phone] AND  PLEASE NOTIFY NUCLEAR MEDICINE AT [facility] AT LEAST 24 HOURS IN ADVANCE IF YOU ARE UNABLE TO KEEP YOUR APPOINTMENT. [phone]  How to prepare  for your Myoview test:  1. Do not eat or drink after midnight 2. No caffeine for 24 hours prior to test 3. No smoking 24 hours prior to test. 4. Your medication may be taken with water.  If your doctor stopped a medication because of this test, do not take that medication. 5. Ladies, please do not wear dresses.  Skirts or pants are appropriate. Please wear a short sleeve shirt. 6. No perfume, cologne or lotion. 7. Wear comfortable walking shoes. No heels!        Echo for aortic valve stenosis Your physician has requested that you have an echocardiogram. Echocardiography is a painless test that uses sound waves to create images of your heart. It provides your doctor with information about the size and shape of your heart and how well your heart's chambers and valves are working. This procedure takes approximately one hour. There are no restrictions for this procedure.   Carotid ultrasound with bruits Your physician has requested that you have a carotid duplex. This test is an ultrasound of the carotid arteries in your neck. It looks at blood flow through these arteries that supply the brain with blood. Allow one hour for this exam. There are no restrictions or special instructions.    Follow-Up: At [facility], you and your health needs are our priority.  As part of our continuing mission to provide you with exceptional heart care, we have created designated Provider Care Teams.  These Care Teams include your primary Cardiologist (physician) and Advanced Practice Providers (APPs -  Physician Assistants and Nurse Practitioners) who all work together to provide you with the care you need, when you need it.  . You will need a follow up appointment in 6 months .   Please call our office 2 months in advance to schedule this appointment.    . Providers on your designated Care Team:   . Nicolasa Duckinghristopher Berge, NP . Eula Listenyan Dunn, PA-C . Marisue IvanJacquelyn Visser, PA-C  Any Other Special Instructions Will  Be Listed Below (If Applicable).  For educational health videos Log in to : [email or web address] Or : [email or web address], password : [email or web address]

## 2018-06-02 NOTE — Telephone Encounter (Signed)
Patient seeing Dr Mariah MillingGollan today at 3 pm. Routing to Dr Mariah MillingGollan.

## 2018-06-02 NOTE — Progress Notes (Signed)
Cardiology Office Note  Date:  06/02/2018   ID:  Andrea Holden, DOB 1945-07-23, MRN [id number]  PCP:  Kerman Passey, MD   Chief Complaint  Patient presents with  . OTHER    Heart murmur. Meds reviewed verbally with pt.    HPI:  Ms. Andrea Holden is a 72 year old woman with past medical history of Diabetes, hemoglobin A1c 6.8 Obesity Hyperlipidemia, noncompliant with Lipitor PAD Smoker 55 years Referred by Dr. Sherie Don for consultation of her aortic valve stenosis, murmur, PAD preoperative evaluation prior to left femoral endarterectomy  Reports a strong family history of coronary disease, heart attacks She has a long smoking history, diabetes Reports having claudication symptoms on a daily basis Was told by Dr. 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 Wyn Quaker Percutaneous transluminal angioplasty of left common femoral artery with 5 mm diameter by 4 cm length Lutonix drug-coated angioplasty balloon  Left lower extremity nearly occlusive highly calcific stenosis of the left common femoral artery of >90%.  The disease extended into the origins of both the profunda femoris and the superficial femoral artery although the profunda femoris artery disease was mild.  The superficial femoral artery was occluded in the proximal segment.  It reconstituted in the proximal to mid SFA although there was  still some disease in the mid to distal SFA of greater than 70%.  The popliteal artery was relatively normal.  Tibioperoneal trunk had about an 80 to 90% stenosis with the peroneal artery distally.       Right lower extremity: There was about an 80 to 90% stenosis in the proximal superficial femoral artery.  The common femoral artery disease was fairly mild.  The mid SFA was occluded and reconstituted at [facility] canal.  Again, there appeared to be stenosis in the tibioperoneal trunk   Lab work reviewed in detail Total cholesterol 183, LDL 116 Now taking Lipitor  CT scan chest August 2012 Images pulled up and reviewed in the office today   PMH:   has a past medical history of Allergy, Anemia, Anxiety, Aortic stenosis (12/24/2017), Arthritis, Asthma, Cardiac murmur (12/12/2017), Cataract, Clotting disorder (HCC), Complication of anesthesia, Degenerative disc disease, lumbar, Degenerative disc disease, lumbar, Depression, Diabetes mellitus, Diabetic neuropathy (HCC) (11/16/2015), Dysrhythmia, GERD (gastroesophageal reflux disease), H/O transfusion, History of chicken pox, History of hiatal hernia, History of measles, mumps, or rubella, HOH (hard of hearing), Hyperlipidemia, Hypertension, Irregular heartbeat, LVH (left ventricular hypertrophy) (12/24/2017), Moderate COPD (chronic obstructive pulmonary disease) (HCC) (12/14/2014), Neuropathy, Opiate use (11/16/2015), Peripheral arterial disease (HCC) (05/06/2018), Pes planus of both feet (11/16/2015), Plantar fasciitis of right foot (11/16/2015), and Wheezing.  PSH:    Past Surgical History:  Procedure Laterality Date  . ABDOMINAL HYSTERECTOMY    . banck injections    . CATARACT EXTRACTION W/PHACO Right 05/30/2015   Procedure: CATARACT EXTRACTION PHACO AND INTRAOCULAR LENS PLACEMENT (IOC);  Surgeon: Galen Manila, MD;  Location: [facility] ORS;  Service: Ophthalmology;  Laterality: Right;  US 00:57 AP% 20.9 CDE 11.99 fluid pack lot #[id number]  .  CHOLECYSTECTOMY    . COLON SURGERY     blocked colon  . COLONOSCOPY WITH PROPOFOL N/A 01/20/2018   Procedure: COLONOSCOPY WITH PROPOFOL;  Surgeon: Midge MiniumWohl, Darren, MD;  [facility];  Service: Endoscopy;  Laterality: N/A;  . EYE SURGERY    . FOOT FUSION Right 2018  . GALLBLADDER SURGERY    . GASTRIC BYPASS  2010  . internal bleeding  2016  . LOWER EXTREMITY ANGIOGRAPHY Left 05/27/2018   Procedure: LOWER EXTREMITY ANGIOGRAPHY;  Surgeon: Annice Needyew, Jason S, MD;  Location: [facility] INVASIVE CV LAB;  Service: Cardiovascular;  Laterality: Left;  . MOUTH SURGERY    . OVARY SURGERY    . SKIN GRAFT Right 2018   RT foot  . TENOTOMY ACHILLES TENDON     Percuntaneous  . TONSILLECTOMY      Current Outpatient Medications  Medication Sig Dispense Refill  . acetaminophen (TYLENOL) 325 MG tablet Take 325 mg by mouth every 6 (six) hours as needed.     Marland Kitchen. albuterol (PROVENTIL HFA;VENTOLIN HFA) 108 (90 Base) MCG/ACT inhaler Inhale 2 puffs into the lungs every 6 (six) hours as needed for wheezing or shortness of breath. 1 Inhaler 1  . atorvastatin (LIPITOR) 10 MG tablet Take 1 tablet (10 mg total) by mouth at bedtime. 30 tablet 5  . calcium carbonate (TUMS - DOSED IN MG ELEMENTAL CALCIUM) 500 MG chewable tablet Chew 2-3 tablets by mouth daily.     Marland Kitchen. co-enzyme Q-10 30 MG capsule Take 30 mg by mouth daily.     . fluticasone (FLONASE) 50 MCG/ACT nasal spray Place 2 sprays into the nose daily as needed.    . gabapentin (NEURONTIN) 400 MG capsule Take 1 capsule (400 mg total) by mouth 2 (two) times daily. 60 capsule 2  . HYDROcodone-acetaminophen (NORCO) 5-325 MG tablet Take 0.5 tablets by mouth as needed.     Marland Kitchen. HYDROcodone-acetaminophen (NORCO) 5-325 MG tablet Norco 5 mg-325 mg tablet  1 PO BID FILL ON 11/24/17    . lisinopril (PRINIVIL,ZESTRIL) 10 MG tablet Take 1 tablet (10 mg total) by mouth daily. 90 tablet 1  . Melatonin 5 MG TABS Take 1 tablet by mouth at bedtime.     . Multiple Vitamins-Minerals  (MULTIVITAMIN ADULTS 50+) TABS Take 1 tablet by mouth daily.     . pneumococcal 13-valent conjugate vaccine (PREVNAR 13) SUSP injection Prevnar 13 (PF) 0.5 mL intramuscular syringe    . ranitidine (ZANTAC) 300 MG tablet Take 1 tablet (300 mg total) by mouth at bedtime. 90 tablet 1  . sertraline (ZOLOFT) 100 MG tablet TAKE 1 AND 1/2 TABLETS BY MOUTH DAILY FOR A TOTAL OF 150 MG 135 tablet 1  . varenicline (CHANTIX CONTINUING MONTH PAK) 1 MG tablet Take 1 tablet (1 mg total) by mouth 2 (two) times daily. ** FILL AFTER THE STARTER MONTH PAK ** 60 tablet 2  . vitamin C (ASCORBIC ACID) 500 MG tablet Take 500 mg by mouth every other day.      No current facility-administered medications for this visit.      Allergies:   Meloxicam and Sitagliptin   Social History:  The patient  reports that she quit smoking 7 days ago. Her smoking use included cigarettes. She started smoking about 57 years ago. She has a 27.50 pack-year smoking history. She has never used smokeless tobacco. She reports that she drinks alcohol. She reports that  she has current or past drug history. Drug: Marijuana.   Family History:   family history includes Arthritis in her brother and father; Asthma in her mother; COPD in her mother; Colitis in her daughter; Depression in her brother and father; Diabetes in her brother and maternal grandmother; Heart attack in her brother and father; Heart disease in her brother and father; Hyperlipidemia in her brother; Hypertension in her brother and father; Stroke in her brother and father; Thyroid disease in her daughter; Vision loss in her brother.    Review of Systems: Review of Systems  Constitutional: Negative.   Respiratory: Positive for shortness of breath.   Cardiovascular: Negative.   Gastrointestinal: Negative.   Musculoskeletal: Negative.        Bilateral leg pain  Neurological: Negative.   Psychiatric/Behavioral: Negative.   All other systems reviewed and are  negative.    PHYSICAL EXAM: VS:  BP (!) 157/77 (BP Location: Right Wrist, Patient Position: Sitting, Cuff Size: Normal)   Pulse 72   Ht 5\' 2"  (1.575 m)   Wt 209 lb 8 oz (95 kg)   BMI 38.32 kg/m  , BMI Body mass index is 38.32 kg/m. GEN: Well nourished, well developed, in no acute distress  HEENT: normal  Neck: no JVD, carotid bruits, or masses Cardiac: RRR; no murmurs, rubs, or gallops,no edema  Unable to appreciate lower extremity pulses Respiratory:  clear to auscultation bilaterally, normal work of breathing GI: soft, nontender, nondistended, + BS MS: no deformity or atrophy  Skin: warm and dry, no rash Neuro:  Strength and sensation are intact Psych: euthymic mood, full affect   Recent Labs: 04/23/2018: ALT 8; Hemoglobin 13.1; Platelets 202; Potassium 4.9; Sodium 138 05/26/2018: BUN 19; Creatinine, Ser 0.77    Lipid Panel Lab Results  Component Value Date   CHOL 183 12/12/2017   HDL 45 (L) 12/12/2017   LDLCALC 116 (H) 12/12/2017   TRIG 114 12/12/2017      Wt Readings from Last 3 Encounters:  06/02/18 209 lb 8 oz (95 kg)  05/27/18 210 lb (95.3 kg)  05/26/18 211 lb (95.7 kg)       ASSESSMENT AND PLAN:  Peripheral arterial disease (HCC) - Plan: EKG 12-Lead Severe lower extremity arterial disease Diabetic polyneuropathy associated with type 2 diabetes mellitus (HCC)  COPD, mild (HCC) Long smoking history Currently non-smoker  Mixed hyperlipidemia - Plan: EKG 12-Lead, ECHOCARDIOGRAM COMPLETE Stressed the importance of taking her Lipitor Goal LDL 60 or less Numbers discussed with her  Hx of smoking Reports that she stopped smoking several years ago  55-year smoking history   atherosclerosis of native artery of both lower extremities with intermittent claudication severe bilateral lateral lower extremity disease  Shortness of breath - We have ordered echocardiogram and stress test Significant coronary calcification seen on CT scan 2012 Shortness of  breath at baseline, unable to exclude ischemia  Bruit - Plan: VAS US CAROTID Carotid ultrasound ordered given bruit worse on the left in the right Unable to distinguish from aortic valve stenosis  Coronary artery disease of native artery of native heart with stable angina pectoris (HCC) Images from CT scan pulled up and discussed with her in detail, strong family history of bad coronary disease Stress test pending She does not want catheterization  Aortic valve disease Echocardiogram pending  Disposition:   F/U  6 months   Total encounter time more than 60 minutes  Greater than 50% was spent in counseling and coordination of care with the patient  Orders Placed This Encounter  Procedures  . NM Myocar Multi W/Spect W/Wall Motion / EF  . EKG 12-Lead  . ECHOCARDIOGRAM COMPLETE     Signed, Dossie Arbour, M.D., Ph.D. 06/02/2018  [facility] [place], [place] [phone]

## 2018-06-02 NOTE — Telephone Encounter (Signed)
Patient scheduled for today at 3pm.

## 2018-06-03 ENCOUNTER — Ambulatory Visit: Admission: RE | Admit: 2018-06-03 | Payer: Medicare HMO | Source: Ambulatory Visit | Admitting: Vascular Surgery

## 2018-06-03 ENCOUNTER — Encounter: Admission: RE | Payer: Self-pay | Source: Ambulatory Visit

## 2018-06-03 ENCOUNTER — Ambulatory Visit
Admission: RE | Admit: 2018-06-03 | Discharge: 2018-06-03 | Disposition: A | Discharge status: Home / Self Care | Payer: Medicare HMO | Source: Ambulatory Visit | Attending: Cardiovascular Disease | Admitting: Cardiovascular Disease

## 2018-06-03 ENCOUNTER — Other Ambulatory Visit: Payer: Self-pay | Admitting: Cardiovascular Disease

## 2018-06-03 DIAGNOSIS — R0602 Shortness of breath: Secondary | ICD-10-CM | POA: Insufficient documentation

## 2018-06-03 LAB — NM MYOCAR MULTI W/SPECT W/WALL MOTION / EF
Estimated workload: 1 METS
Exercise duration (min): 0 min
Exercise duration (sec): 0 s
LV dias vol: 46 mL (ref 46–106)
LV sys vol: 12 mL
MPHR: 149 {beats}/min
Peak HR: 85 {beats}/min
Percent HR: 57 %
Rest HR: 64 {beats}/min
SDS: 1
SRS: 4
SSS: 2
TID: 0.63

## 2018-06-03 SURGERY — LOWER EXTREMITY ANGIOGRAPHY
Anesthesia: Moderate Sedation | Laterality: Right

## 2018-06-03 MED ORDER — TECHNETIUM TC 99M TETROFOSMIN IV KIT
29.0900 | PACK | Freq: Once | INTRAVENOUS | Status: AC | PRN
  Administered 2018-06-03: 29.09 via INTRAVENOUS

## 2018-06-03 MED ORDER — REGADENOSON 0.4 MG/5ML IV SOLN
0.4000 mg | Freq: Once | INTRAVENOUS | Status: AC
  Administered 2018-06-03: 0.4 mg via INTRAVENOUS

## 2018-06-03 MED ORDER — TECHNETIUM TC 99M TETROFOSMIN IV KIT
10.0000 | PACK | Freq: Once | INTRAVENOUS | Status: AC | PRN
  Administered 2018-06-03: 10.63 via INTRAVENOUS

## 2018-06-04 ENCOUNTER — Ambulatory Visit (INDEPENDENT_AMBULATORY_CARE_PROVIDER_SITE_OTHER): Payer: Medicare HMO

## 2018-06-04 ENCOUNTER — Other Ambulatory Visit: Payer: Self-pay

## 2018-06-04 DIAGNOSIS — R0602 Shortness of breath: Secondary | ICD-10-CM | POA: Diagnosis not present

## 2018-06-04 DIAGNOSIS — R0989 Other specified symptoms and signs involving the circulatory and respiratory systems: Secondary | ICD-10-CM | POA: Diagnosis not present

## 2018-06-05 ENCOUNTER — Encounter: Payer: Self-pay | Admitting: Family Medicine

## 2018-06-05 ENCOUNTER — Ambulatory Visit (INDEPENDENT_AMBULATORY_CARE_PROVIDER_SITE_OTHER): Payer: Medicare HMO | Admitting: Family Medicine

## 2018-06-05 DIAGNOSIS — I350 Nonrheumatic aortic (valve) stenosis: Secondary | ICD-10-CM

## 2018-06-05 DIAGNOSIS — E1142 Type 2 diabetes mellitus with diabetic polyneuropathy: Secondary | ICD-10-CM | POA: Diagnosis not present

## 2018-06-05 DIAGNOSIS — I739 Peripheral vascular disease, unspecified: Secondary | ICD-10-CM

## 2018-06-05 DIAGNOSIS — I25118 Atherosclerotic heart disease of native coronary artery with other forms of angina pectoris: Secondary | ICD-10-CM

## 2018-06-05 DIAGNOSIS — E1169 Type 2 diabetes mellitus with other specified complication: Secondary | ICD-10-CM | POA: Diagnosis not present

## 2018-06-05 DIAGNOSIS — E782 Mixed hyperlipidemia: Secondary | ICD-10-CM

## 2018-06-05 NOTE — Patient Instructions (Addendum)
Check out the information at familydoctor.org entitled "Nutrition for Weight Loss: What You Need to Know about Fad Diets" Try to lose between 1-2 pounds per week by taking in fewer calories and burning off more calories You can succeed by limiting portions, limiting foods dense in calories and fat, becoming more active, and drinking 8 glasses of water a day (64 ounces) Don't skip meals, especially breakfast, as skipping meals may alter your metabolism Do not use over-the-counter weight loss pills or gimmicks that claim rapid weight loss A healthy BMI (or body mass index) is between 18.5 and 24.9 You can calculate your ideal BMI at the NIH website JobEconomics.hu  Try to limit saturated fats in your diet (bologna, hot dogs, barbeque, cheeseburgers, hamburgers, steak, bacon, sausage, cheese, etc.) and get more fresh fruits, vegetables, and whole grains   Preventing Unhealthy Weight Gain, Adult Staying at a healthy weight is important. When fat builds up in your body, you may become overweight or obese. These conditions put you at greater risk for developing certain health problems, such as heart disease, diabetes, sleeping problems, joint problems, and some cancers. Unhealthy weight gain is often the result of making unhealthy choices in what you eat. It is also a result of not getting enough exercise. You can make changes to your lifestyle to prevent obesity and stay as healthy as possible. What nutrition changes can be made? To maintain a healthy weight and prevent obesity:  Eat only as much as your body needs. To do this: ? Pay attention to signs that you are hungry or full. Stop eating as soon as you feel full. ? If you feel hungry, try drinking water first. Drink enough water so your urine is clear or pale yellow. ? Eat smaller portions. ? Look at serving sizes on food labels. Most foods contain more than one serving per container. ? Eat  the recommended amount of calories for your gender and activity level. While most active people should eat around 2,000 calories per day, if you are trying to lose weight or are not very active, you main need to eat less calories. Talk to your health care provider or dietitian about how many calories you should eat each day.  Choose healthy foods, such as: ? Fruits and vegetables. Try to fill at least half of your plate at each meal with fruits and vegetables. ? Whole grains, such as whole wheat bread, brown rice, and quinoa. ? Lean meats, such as chicken or fish. ? Other healthy proteins, such as beans, eggs, or tofu. ? Healthy fats, such as nuts, seeds, fatty fish, and olive oil. ? Low-fat or fat-free dairy.  Check food labels and avoid food and drinks that: ? Are high in calories. ? Have added sugar. ? Are high in sodium. ? Have saturated fats or trans fats.  Limit how much you eat of the following foods: ? Prepackaged meals. ? Fast food. ? Fried foods. ? Processed meat, such as bacon, sausage, and deli meats. ? Fatty cuts of red meat and poultry with skin.  Cook foods in healthier ways, such as by baking, broiling, or grilling.  When grocery shopping, try to shop around the outside of the store. This helps you buy mostly fresh foods and avoid canned and prepackaged foods.  What lifestyle changes can be made?  Exercise at least 30 minutes 5 or more days each week. Exercising includes brisk walking, yard work, biking, running, swimming, and team sports like basketball and soccer. Ask your health  care provider which exercises are safe for you.  Do not use any products that contain nicotine or tobacco, such as cigarettes and e-cigarettes. If you need help quitting, ask your health care provider.  Limit alcohol intake to no more than 1 drink a day for nonpregnant women and 2 drinks a day for men. One drink equals 12 oz of beer, 5 oz of wine, or 1 oz of hard liquor.  Try to get 7-9  hours of sleep each night. What other changes can be made?  Keep a food and activity journal to keep track of: ? What you ate and how many calories you had. Remember to count sauces, dressings, and side dishes. ? Whether you were active, and what exercises you did. ? Your calorie, weight, and activity goals.  Check your weight regularly. Track any changes. If you notice you have gained weight, make changes to your diet or activity routine.  Avoid taking weight-loss medicines or supplements. Talk to your health care provider before starting any new medicine or supplement.  Talk to your health care provider before trying any new diet or exercise plan. Why are these changes important? Eating healthy, staying active, and having healthy habits not only help prevent obesity, they also:  Help you to manage stress and emotions.  Help you to connect with friends and family.  Improve your self-esteem.  Improve your sleep.  Prevent long-term health problems.  What can happen if changes are not made? Being obese or overweight can cause you to develop joint or bone problems, which can make it hard for you to stay active or do activities you enjoy. Being obese or overweight also puts stress on your heart and lungs and can lead to health problems like diabetes, heart disease, and some cancers. Where to find more information: Talk with your health care provider or a dietitian about healthy eating and healthy lifestyle choices. You may also find other information through these resources:  [place]. Department of Agriculture MyPlate: https://ball-collins.biz/  American Heart Association: www.heart.org  Centers for Disease Control and Prevention: FootballExhibition.com.br  Summary  Staying at a healthy weight is important. It helps prevent certain diseases and health problems, such as heart disease, diabetes, joint problems, sleep disorders, and some cancers.  Being obese or overweight can cause you to develop joint  or bone problems, which can make it hard for you to stay active or do activities you enjoy.  You can prevent unhealthy weight gain by eating a healthy diet, exercising regularly, not smoking, limiting alcohol, and getting enough sleep.  Talk with your health care provider or a dietitian for guidance about healthy eating and healthy lifestyle choices. This information is not intended to replace advice given to you by your health care provider. Make sure you discuss any questions you have with your health care provider. Document Released: 06/18/2016 Document Revised: 07/24/2016 Document Reviewed: 07/24/2016 Elsevier Interactive Patient Education  2018 Elsevier Inc.  Carotid Artery Disease The carotid arteries are arteries on both sides of the neck. They carry blood to the brain. Carotid artery disease is when the arteries get smaller (narrow) or get blocked. If these arteries get smaller or get blocked, you are more likely to have a stroke or warning stroke (transient ischemic attack). Follow these instructions at home:  Take medicines as told by your doctor. Make sure you understand all your medicine instructions. Do not stop your medicines without talking to your doctor first.  Follow your doctor's diet instructions. It is important  to eat a healthy diet that includes plenty of: ? Fresh fruits. ? Vegetables. ? Lean meats.  Avoid: ? High-fat foods. ? High-sodium foods. ? Foods that are fried, overly processed, or have poor nutritional value.  Stay a healthy weight.  Stay active. Get at least 30 minutes of activity every day.  Do not smoke.  Limit alcohol use to: ? No more than 2 drinks a day for men. ? No more than 1 drink a day for women who are not pregnant.  Do not use illegal drugs.  Keep all doctor visits as told. Get help right away if:  You have sudden weakness or loss of feeling (numbness) on one side of the body, such as the face, arm, or leg.  You have sudden  confusion.  You have trouble speaking (aphasia) or understanding.  You have sudden trouble seeing out of one or both eyes.  You have sudden trouble walking.  You have dizziness or feel like you might pass out (faint).  You have a loss of balance or your movements are not steady (uncoordinated).  You have a sudden, severe headache with no known cause.  You have trouble swallowing (dysphagia). Call your local emergency services (911 in [place] Do notdrive yourself to the clinic or hospital. This information is not intended to replace advice given to you by your health care provider. Make sure you discuss any questions you have with your health care provider. Document Released: 06/03/2012 Document Revised: 11/23/2015 Document Reviewed: 12/16/2012 Elsevier Interactive Patient Education  [employer]2018 Elsevier Inc.

## 2018-06-05 NOTE — Assessment & Plan Note (Signed)
Patient declined foot exam; last A1c showed good control

## 2018-06-05 NOTE — Assessment & Plan Note (Signed)
Just saw cardiologist; goal LDL is less than 70; work on weight loss

## 2018-06-05 NOTE — Assessment & Plan Note (Signed)
Limit saturated fats; check lipids today; goal LDL is less than 70 

## 2018-06-05 NOTE — Assessment & Plan Note (Signed)
Just saw cardiologist; just had echo, report not yet available; murmur sounds stable to me

## 2018-06-05 NOTE — Assessment & Plan Note (Signed)
Seeing vascular 

## 2018-06-05 NOTE — Assessment & Plan Note (Signed)
encouraged weight loss 

## 2018-06-05 NOTE — Progress Notes (Signed)
BP 126/78   Pulse 83   Temp 98 F (36.7 C) (Oral)   Ht 5\' 2"  (1.575 m)   Wt 208 lb 9.6 oz (94.6 kg)   SpO2 96%   BMI 38.15 kg/m    Subjective:    Patient ID: Andrea Holden, female    DOB: May 13, 1946, 72 y.o.   MRN: [id number]  HPI: Andrea Holden is a 72 y.o. female  Chief Complaint  Patient presents with  . Follow-up    HPI  Patient is here for follow-up  Went to see the vascular doctor for moderate bilateral PAD left worse than right; reviewed the ABI from April 28, 2018; they tried to balloon it and they couldn't get it open  She had the carotid test yesterday and has a blockage on the left but no surgery planned yet; that was done at vascular doctor's office  She saw Dr. Mariah Milling and that was a great visit; he showed her previous images and explained things; he stopped the baby aspirin and turmeric; moderate to severe three-vessel coronary disease on chest CT from 2012 per Dr. Windell Hummingbird note, but the report I looked at does not mention CAD NM stress test:  She denies [facility] and [facility]; it's the legs that give out of oxygen and feel tired and ache; she has COPD in her chart, listed June 2016; note reviewed; CXR from April 2016 did not mention COPD; no mention of COPD in the 2012 chest CT  Type 2 diabetes; last A1c was 6.8 in October  Depression screen [facility] 2/9 06/05/2018 04/23/2018 12/12/2017 10/22/2017 10/17/2017  Decreased Interest 3 0 0 1 1  Down, Depressed, Hopeless 3 1 0 1 1  PHQ - 2 Score 6 1 0 2 2  Altered sleeping 0 0 1 - 2  Tired, decreased energy 0 1 1 - 2  Change in appetite 0 0 0 - 2  Feeling bad or failure about yourself  1 0 0 - 1  Trouble concentrating 1 0 0 - 3  Moving slowly or fidgety/restless 0 0 0 - 0  Suicidal thoughts 0 0 0 - 0  PHQ-9 Score 8 2 2  - 12  Difficult doing work/chores Not difficult at all Somewhat difficult Not difficult at all - Somewhat difficult  Some recent data might be hidden   Fall Risk  06/05/2018 04/23/2018 01/23/2018  12/12/2017 11/05/2017  Falls in the past year? 1 Yes Yes Yes Yes  Comment - - - - no falls since 09/2017 visit  Number falls in past yr: 1 2 or more 1 1 -  Injury with Fall? 0 No (No Data) Yes -  Comment - - bruising on knee and mild abrasion - -  Risk for fall due to : - - History of fall(s) - -  Risk for fall due to: Comment - - - - -  Follow up - - - - -    Relevant past medical, surgical, family and social history reviewed Past Medical History:  Diagnosis Date  . Allergy    seasonal allergies  . Anemia   . Anxiety   . Aortic stenosis 12/24/2017   Echo Aug 2018  . Arthritis   . Asthma    allergy induced asthma  . Cardiac murmur 12/12/2017  . Cataract   . Clotting disorder (HCC)    history of blood clots  . Complication of anesthesia    arrhythmia following colonoscopy  . Degenerative disc disease, lumbar   . Degenerative disc  disease, lumbar   . Depression   . Diabetes mellitus   . Diabetic neuropathy (HCC) 11/16/2015  . Dysrhythmia   . GERD (gastroesophageal reflux disease)   . H/O transfusion    patient [facility] units [facility], blood type O+  . History of chicken pox   . History of hiatal hernia   . History of measles, mumps, or rubella   . HOH (hard of hearing)   . Hyperlipidemia   . Hypertension   . Irregular heartbeat   . LVH (left ventricular hypertrophy) 12/24/2017   Echo Aug 2018  . Moderate COPD (chronic obstructive pulmonary disease) (HCC) 12/14/2014  . Neuropathy   . Opiate use 11/16/2015  . Peripheral arterial disease (HCC) 05/06/2018   At rest, left > right; refer to vasc  . Pes planus of both feet 11/16/2015  . Plantar fasciitis of right foot 11/16/2015  . Wheezing    Past Surgical History:  Procedure Laterality Date  . ABDOMINAL HYSTERECTOMY    . banck injections    . CATARACT EXTRACTION W/PHACO Right 05/30/2015   Procedure: CATARACT EXTRACTION PHACO AND INTRAOCULAR LENS PLACEMENT (IOC);  Surgeon: Galen ManilaWilliam Porfilio, MD;  Location:  [facility] ORS;  Service: Ophthalmology;  Laterality: Right;  US 00:57 AP% 20.9 CDE 11.99 fluid pack lot #[id number]  . CHOLECYSTECTOMY    . COLON SURGERY     blocked colon  . COLONOSCOPY WITH PROPOFOL N/A 01/20/2018   Procedure: COLONOSCOPY WITH PROPOFOL;  Surgeon: Midge MiniumWohl, Darren, MD;  [facility];  Service: Endoscopy;  Laterality: N/A;  . EYE SURGERY    . FOOT FUSION Right 2018  . GALLBLADDER SURGERY    . GASTRIC BYPASS  2010  . internal bleeding  2016  . LOWER EXTREMITY ANGIOGRAPHY Left 05/27/2018   Procedure: LOWER EXTREMITY ANGIOGRAPHY;  Surgeon: Annice Needyew, Jason S, MD;  Location: [facility] INVASIVE CV LAB;  Service: Cardiovascular;  Laterality: Left;  . MOUTH SURGERY    . OVARY SURGERY    . SKIN GRAFT Right 2018   RT foot  . TENOTOMY ACHILLES TENDON     Percuntaneous  . TONSILLECTOMY     Family History  Problem Relation Age of Onset  . Asthma Mother   . COPD Mother   . Arthritis Father   . Depression Father   . Heart disease Father   . Hypertension Father   . Stroke Father   . Heart attack Father   . Arthritis Brother   . Depression Brother   . Diabetes Brother   . Heart disease Brother   . Hyperlipidemia Brother   . Hypertension Brother   . Stroke Brother   . Vision loss Brother   . Heart attack Brother   . Diabetes Maternal Grandmother   . Thyroid disease Daughter   . Colitis Daughter   . Breast cancer Neg Hx    Social History   Tobacco Use  . Smoking status: Former Smoker    Packs/day: 0.50    Years: 55.00    Pack years: 27.50    Types: Cigarettes    Start date: 07/01/1960    Last attempt to quit: 05/26/2018    Years since quitting: 0.0  . Smokeless tobacco: Never Used  Substance Use Topics  . Alcohol use: Yes    Alcohol/week: 0.0 standard drinks    Comment: 2 drinks per month  . Drug use: Not Currently    Types: Marijuana    Comment: used for pain  Office Visit from 06/05/2018 in [facility]  AUDIT-C Score  1      Interim  medical history since last visit reviewed. Allergies and medications reviewed  Review of Systems Per HPI unless specifically indicated above     Objective:    BP 126/78   Pulse 83   Temp 98 F (36.7 C) (Oral)   Ht 5\' 2"  (1.575 m)   Wt 208 lb 9.6 oz (94.6 kg)   SpO2 96%   BMI 38.15 kg/m   Wt Readings from Last 3 Encounters:  06/05/18 208 lb 9.6 oz (94.6 kg)  06/02/18 209 lb 8 oz (95 kg)  05/27/18 210 lb (95.3 kg)    Physical Exam  Constitutional: She appears well-developed and well-nourished.  obese  HENT:  Mouth/Throat: Mucous membranes are normal.  Eyes: EOM are normal. No scleral icterus.  Cardiovascular: Normal rate and regular rhythm.  Murmur heard.  Systolic murmur is present with a grade of 2/6. Pulmonary/Chest: Effort normal and breath sounds normal.  Psychiatric: She has a normal mood and affect. Her behavior is normal.   Patient declined foot exam today  Results for orders placed or performed during the hospital encounter of 06/03/18  NM Myocar Multi W/Spect W/Wall Motion / EF  Result Value Ref Range   Rest HR 64 bpm   Rest BP 156/69 mmHg   Exercise duration (sec) 0 sec   Percent HR 57 %   Exercise duration (min) 0 min   Estimated workload 1.0 METS   Peak HR 85 bpm   Peak BP 157/66 mmHg   MPHR 149 bpm   SSS 2    SRS 4    SDS 1    TID 0.63    LV sys vol 12 mL   LV dias vol 46 46 - 106 mL      Assessment & Plan:   Problem List Items Addressed This Visit      Cardiovascular and Mediastinum   Peripheral arterial disease (HCC)    Seeing vascular      Coronary artery disease of native artery of native heart with stable angina pectoris (HCC)    Just saw cardiologist; goal LDL is less than 70; work on weight loss      Aortic stenosis (Chronic)    Just saw cardiologist; just had echo, report not yet available; murmur sounds stable to me        Endocrine   Diabetic neuropathy (HCC) (Chronic)    Patient declined foot exam; last A1c showed  good control        Other   Morbid obesity (HCC) (Chronic)    encouraged weight loss      Hyperlipidemia (Chronic)    Limit saturated fats; check lipids today; goal LDL is less than 70          Follow up plan: No follow-ups on file.  An after-visit summary was printed and given to the patient at check-out.  Please see the patient instructions which may contain other information and recommendations beyond what is mentioned above in the assessment and plan.  No orders of the defined types were placed in this encounter.   No orders of the defined types were placed in this encounter.

## 2018-06-06 LAB — LIPID PANEL
Cholesterol: 132 mg/dL (ref <200)
HDL: 45 mg/dL — ABNORMAL LOW (ref >50)
LDL Cholesterol (Calc): 66 mg/dL (calc)
Non-HDL Cholesterol (Calc): 87 mg/dL (calc) (ref <130)
Total CHOL/HDL Ratio: 2.9 (calc) (ref <5.0)
Triglycerides: 133 mg/dL (ref <150)

## 2018-06-06 NOTE — Telephone Encounter (Signed)
Cala BradfordKimberly, please let the patient know that her cholesterol panel has really improved! She brought her LDL down 50 points! I am so impressed. Her total cholesterol came down 51 points. Keep up the amazing job, and continue the atorvastatin at the current dose. Thank you

## 2018-06-08 ENCOUNTER — Telehealth (INDEPENDENT_AMBULATORY_CARE_PROVIDER_SITE_OTHER): Payer: Self-pay

## 2018-06-08 NOTE — Telephone Encounter (Signed)
I have contacted the patient twice in response to a message that was left. We have both attempted to return phone calls and having to leave messages each time.

## 2018-06-08 NOTE — Telephone Encounter (Signed)
Patient called back and stated she would like to have her right leg angio before hristmas if possible and have her surgery after Christmas. I let her know that I would ask Dr. Wyn Quakerew and get back with her.

## 2018-06-09 NOTE — Telephone Encounter (Signed)
Patient will be scheduled for her surgery on 07/08/18.

## 2018-06-09 NOTE — Telephone Encounter (Signed)
Acceptable risk for vascular surgery with Dr. Wyn Quakerew Echocardiogram stable with mild aortic valve stenosis normal ejection fraction Stress test was low risk with no significant ischemia No further testing needed

## 2018-06-09 NOTE — Telephone Encounter (Signed)
Routed to number provided via EPIC fax and routing to Dr Wyn Quakerew.

## 2018-06-10 ENCOUNTER — Telehealth: Payer: Self-pay | Admitting: *Deleted

## 2018-06-10 NOTE — Telephone Encounter (Signed)
Left voicemail message to call back for her stress test results.

## 2018-06-10 NOTE — Telephone Encounter (Signed)
-----   Message from Antonieta Ibaimothy J Gollan, MD sent at 06/08/2018 10:36 PM EST ----- Pharmacological myocardial perfusion imaging study with no significant  ischemia Normal wall motion, EF estimated at 82% No EKG changes concerning for ischemia at peak stress or in recovery. Low risk scan

## 2018-06-11 ENCOUNTER — Encounter (INDEPENDENT_AMBULATORY_CARE_PROVIDER_SITE_OTHER): Payer: Self-pay

## 2018-06-11 ENCOUNTER — Telehealth (INDEPENDENT_AMBULATORY_CARE_PROVIDER_SITE_OTHER): Payer: Self-pay

## 2018-06-11 NOTE — Telephone Encounter (Signed)
I called the patient twice today to go over her instructions for her upcoming surgery. I left a message for a return call and that I was putting the information in the mail also.

## 2018-06-11 NOTE — Telephone Encounter (Signed)
Left voicemail message to call back  

## 2018-06-12 NOTE — Telephone Encounter (Signed)
Pt is returning your call

## 2018-06-15 DIAGNOSIS — M545 Low back pain: Secondary | ICD-10-CM | POA: Diagnosis not present

## 2018-06-15 NOTE — Telephone Encounter (Signed)
Spoke with patient and reviewed all test results and clearance for upcoming surgery. Routed result note with clearance over to [facility] via Epic. Patient was appreciative for the call with no further questions at this time.

## 2018-06-17 ENCOUNTER — Other Ambulatory Visit (INDEPENDENT_AMBULATORY_CARE_PROVIDER_SITE_OTHER): Payer: Self-pay | Admitting: Vascular Surgery

## 2018-06-17 DIAGNOSIS — Z9862 Peripheral vascular angioplasty status: Secondary | ICD-10-CM

## 2018-06-19 ENCOUNTER — Other Ambulatory Visit: Payer: Self-pay

## 2018-06-19 ENCOUNTER — Encounter (INDEPENDENT_AMBULATORY_CARE_PROVIDER_SITE_OTHER): Payer: Self-pay | Admitting: Vascular Surgery

## 2018-06-19 ENCOUNTER — Ambulatory Visit (INDEPENDENT_AMBULATORY_CARE_PROVIDER_SITE_OTHER): Payer: Medicare HMO | Admitting: Vascular Surgery

## 2018-06-19 ENCOUNTER — Ambulatory Visit (INDEPENDENT_AMBULATORY_CARE_PROVIDER_SITE_OTHER): Payer: Medicare HMO

## 2018-06-19 VITALS — BP 149/59 | HR 80 | Resp 16 | Ht 61.0 in | Wt 209.0 lb

## 2018-06-19 DIAGNOSIS — I119 Hypertensive heart disease without heart failure: Secondary | ICD-10-CM | POA: Diagnosis not present

## 2018-06-19 DIAGNOSIS — E1142 Type 2 diabetes mellitus with diabetic polyneuropathy: Secondary | ICD-10-CM

## 2018-06-19 DIAGNOSIS — Z9862 Peripheral vascular angioplasty status: Secondary | ICD-10-CM

## 2018-06-19 DIAGNOSIS — F17211 Nicotine dependence, cigarettes, in remission: Secondary | ICD-10-CM | POA: Diagnosis not present

## 2018-06-19 DIAGNOSIS — E782 Mixed hyperlipidemia: Secondary | ICD-10-CM | POA: Diagnosis not present

## 2018-06-19 DIAGNOSIS — I70213 Atherosclerosis of native arteries of extremities with intermittent claudication, bilateral legs: Secondary | ICD-10-CM

## 2018-06-19 NOTE — Progress Notes (Signed)
MRN : [id number]  Andrea Holden is a 72 y.o. ([birth year]) female who presents with chief complaint of  Chief Complaint  Patient presents with  . Follow-up    ultrasound  .  History of Present Illness: Patient returns today in follow up of her vascular disease.  She had an angiogram a few weeks ago which demonstrated severe left common femoral, profunda, and SFA disease as well as some distal disease on the left.  As we discussed at that time, she will require open surgical therapy to treat this.  She may be able to have a hybrid approach with both a femoral endarterectomy and some endovascular treatment.  She also has disease on the right, but her flow on that side is a little bit better and we are going to have to delay treatment on that side until her left leg is addressed.  She continues to have disabling claudication and early rest pain symptoms on the left.  Current Outpatient Medications  Medication Sig Dispense Refill  . acetaminophen (TYLENOL) 325 MG tablet Take 325 mg by mouth every 6 (six) hours as needed.     Marland Kitchen albuterol (PROVENTIL HFA;VENTOLIN HFA) 108 (90 Base) MCG/ACT inhaler Inhale 2 puffs into the lungs every 6 (six) hours as needed for wheezing or shortness of breath. 1 Inhaler 1  . atorvastatin (LIPITOR) 10 MG tablet Take 1 tablet (10 mg total) by mouth at bedtime. 30 tablet 5  . calcium carbonate (TUMS - DOSED IN MG ELEMENTAL CALCIUM) 500 MG chewable tablet Chew 2-3 tablets by mouth daily.     Marland Kitchen co-enzyme Q-10 30 MG capsule Take 30 mg by mouth daily.     . fluticasone (FLONASE) 50 MCG/ACT nasal spray Place 2 sprays into the nose daily as needed.    . gabapentin (NEURONTIN) 400 MG capsule Take 1 capsule (400 mg total) by mouth 2 (two) times daily. 60 capsule 2  . HYDROcodone-acetaminophen (NORCO) 5-325 MG tablet Take 0.5 tablets by mouth as needed.     Marland Kitchen lisinopril (PRINIVIL,ZESTRIL) 10 MG tablet Take 1 tablet (10 mg total) by mouth daily. 90 tablet 1  . Melatonin 5  MG TABS Take 1 tablet by mouth at bedtime.     . Multiple Vitamins-Minerals (MULTIVITAMIN ADULTS 50+) TABS Take 1 tablet by mouth daily.     . ranitidine (ZANTAC) 300 MG tablet Take 1 tablet (300 mg total) by mouth at bedtime. 90 tablet 1  . sertraline (ZOLOFT) 100 MG tablet TAKE 1 AND 1/2 TABLETS BY MOUTH DAILY FOR A TOTAL OF 150 MG 135 tablet 1  . varenicline (CHANTIX CONTINUING MONTH PAK) 1 MG tablet Take 1 tablet (1 mg total) by mouth 2 (two) times daily. ** FILL AFTER THE STARTER MONTH PAK ** 60 tablet 2  . vitamin C (ASCORBIC ACID) 500 MG tablet Take 500 mg by mouth every other day.     Marland Kitchen HYDROcodone-acetaminophen (NORCO) 5-325 MG tablet Norco 5 mg-325 mg tablet  1 PO BID FILL ON 11/24/17     No current facility-administered medications for this visit.     Past Medical History:  Diagnosis Date  . Allergy    seasonal allergies  . Anemia   . Anxiety   . Aortic stenosis 12/24/2017   Echo Aug 2018  . Arthritis   . Asthma    allergy induced asthma  . Cardiac murmur 12/12/2017  . Cataract   . Clotting disorder (HCC)    history of blood clots  . Complication  of anesthesia    arrhythmia following colonoscopy  . Degenerative disc disease, lumbar   . Degenerative disc disease, lumbar   . Depression   . Diabetes mellitus   . Diabetic neuropathy (HCC) 11/16/2015  . Dysrhythmia   . GERD (gastroesophageal reflux disease)   . H/O transfusion    patient was given 5 units of blood while at [facility], blood type O+  . History of chicken pox   . History of hiatal hernia   . History of measles, mumps, or rubella   . HOH (hard of hearing)   . Hyperlipidemia   . Hypertension   . Irregular heartbeat   . LVH (left ventricular hypertrophy) 12/24/2017   Echo Aug 2018  . Moderate COPD (chronic obstructive pulmonary disease) (HCC) 12/14/2014  . Neuropathy   . Opiate use 11/16/2015  . Peripheral arterial disease (HCC) 05/06/2018   At rest, left > right; refer to vasc  . Pes planus of both feet  11/16/2015  . Plantar fasciitis of right foot 11/16/2015  . Wheezing     Past Surgical History:  Procedure Laterality Date  . ABDOMINAL HYSTERECTOMY    . banck injections    . CATARACT EXTRACTION W/PHACO Right 05/30/2015   Procedure: CATARACT EXTRACTION PHACO AND INTRAOCULAR LENS PLACEMENT (IOC);  Surgeon: Galen Manila, MD;  Location: [facility] ORS;  Service: Ophthalmology;  Laterality: Right;  [place] 00:57 AP% 20.9 CDE 11.99 fluid pack lot #[id number]  . CHOLECYSTECTOMY    . COLON SURGERY     blocked colon  . COLONOSCOPY WITH PROPOFOL N/A 01/20/2018   Procedure: COLONOSCOPY WITH PROPOFOL;  Surgeon: Midge Minium, MD;  Location: [facility] ENDOSCOPY;  Service: Endoscopy;  Laterality: N/A;  . EYE SURGERY    . FOOT FUSION Right 2018  . GALLBLADDER SURGERY    . GASTRIC BYPASS  2010  . internal bleeding  2016  . LOWER EXTREMITY ANGIOGRAPHY Left 05/27/2018   Procedure: LOWER EXTREMITY ANGIOGRAPHY;  Surgeon: Annice Needy, MD;  Location: [facility] INVASIVE CV LAB;  Service: Cardiovascular;  Laterality: Left;  . MOUTH SURGERY    . OVARY SURGERY    . SKIN GRAFT Right 2018   RT foot  . TENOTOMY ACHILLES TENDON     Percuntaneous  . TONSILLECTOMY      Social History Social History   Tobacco Use  . Smoking status: Former Smoker    Packs/day: 0.50    Years: 55.00    Pack years: 27.50    Types: Cigarettes    Start date: 07/01/1960    Last attempt to quit: 05/26/2018    Years since quitting: 0.0  . Smokeless tobacco: Never Used  Substance Use Topics  . Alcohol use: Yes    Alcohol/week: 0.0 standard drinks    Comment: 2 drinks per month  . Drug use: Not Currently    Types: Marijuana    Comment: used for pain     Family History Family History  Problem Relation Age of Onset  . Asthma Mother   . COPD Mother   . Arthritis Father   . Depression Father   . Heart disease Father   . Hypertension Father   . Stroke Father   . Heart attack Father   . Arthritis Brother   . Depression Brother   .  Diabetes Brother   . Heart disease Brother   . Hyperlipidemia Brother   . Hypertension Brother   . Stroke Brother   . Vision loss Brother   . Heart attack Brother   .  Diabetes Maternal Grandmother   . Thyroid disease Daughter   . Colitis Daughter   . Breast cancer Neg Hx     Allergies  Allergen Reactions  . Meloxicam Other (See Comments)    GI bleeding  . Sitagliptin Hives and Itching   REVIEW OF SYSTEMS (Negative unless checked)  Constitutional: [] ?Weight loss  [] ?Fever  [] ?Chills Cardiac: [] ?Chest pain   [] ?Chest pressure   [] ?Palpitations   [] ?Shortness of breath when laying flat   [] ?Shortness of breath at rest   [] ?Shortness of breath with exertion. Vascular:  [x] ?Pain in legs with walking   [] ?Pain in legs at rest   [] ?Pain in legs when laying flat   [x] ?Claudication   [] ?Pain in feet when walking  [] ?Pain in feet at rest  [] ?Pain in feet when laying flat   [] ?History of DVT   [] ?Phlebitis   [x] ?Swelling in legs   [] ?Varicose veins   [] ?Non-healing ulcers Pulmonary:   [] ?Uses home oxygen   [] ?Productive cough   [] ?Hemoptysis   [] ?Wheeze  [] ?COPD   [] ?Asthma Neurologic:  [] ?Dizziness  [] ?Blackouts   [] ?Seizures   [] ?History of stroke   [] ?History of TIA  [] ?Aphasia   [] ?Temporary blindness   [] ?Dysphagia   [] ?Weakness or numbness in arms   [x] ?Weakness or numbness in legs Musculoskeletal:  [x] ?Arthritis   [] ?Joint swelling   [x] ?Joint pain   [] ?Low back pain Hematologic:  [] ?Easy bruising  [] ?Easy bleeding   [] ?Hypercoagulable state   [] ?Anemic  [] ?Hepatitis Gastrointestinal:  [] ?Blood in stool   [] ?Vomiting blood  [] ?Gastroesophageal reflux/heartburn   [] ?Abdominal pain Genitourinary:  [] ?Chronic kidney disease   [] ?Difficult urination  [] ?Frequent urination  [] ?Burning with urination   [] ?Hematuria Skin:  [] ?Rashes   [] ?Ulcers   [] ?Wounds Psychological:  [] ?History of anxiety   [] ? History of major depression.   Physical Examination  BP (!) 149/59   Pulse 80   Resp 16    Ht 5\' 1"  (1.549 m)   Wt 209 lb (94.8 kg)   BMI 39.49 kg/m  Gen:  WD/WN, NAD Head: [place]/AT, No temporalis wasting. Ear/Nose/Throat: Hearing grossly intact, nares w/o erythema or drainage Eyes: Conjunctiva clear. Sclera non-icteric Neck: Supple.  Trachea midline Pulmonary:  Good air movement, no use of accessory muscles.  Cardiac: RRR, no JVD Vascular:  Vessel Right Left  Radial Palpable Palpable                          PT 1+ Palpable Trace Palpable  DP Trace Palpable Not Palpable    Musculoskeletal: M/S 5/5 throughout.  No deformity or atrophy. Trace LE edema. Neurologic: Sensation grossly intact in extremities.  Symmetrical.  Speech is fluent.  Psychiatric: Judgment intact, Mood & affect appropriate for pt's clinical situation. Dermatologic: No rashes or ulcers noted.  No cellulitis or open wounds.       Labs Recent Results (from the past 2160 hour(s))  COMPLETE METABOLIC PANEL WITH GFR     Status: None   Collection Time: 04/23/18 12:04 PM  Result Value Ref Range   Glucose, Bld 138 65 - 139 mg/dL    Comment: .        Non-fasting reference interval .    BUN 17 7 - 25 mg/dL   Creat 1.61 0.96 - 0.45 mg/dL    Comment: For patients >59 years of age, the reference limit for Creatinine is approximately 13% higher for people identified as African-American. .    GFR, Est Non African American 69 >  OR = 60 mL/min/1.53m2   GFR, Est African American 80 > OR = 60 mL/min/1.76m2   BUN/Creatinine Ratio NOT APPLICABLE 6 - 22 (calc)   Sodium 138 135 - 146 mmol/L   Potassium 4.9 3.5 - 5.3 mmol/L   Chloride 105 98 - 110 mmol/L   CO2 24 20 - 32 mmol/L   Calcium 9.2 8.6 - 10.4 mg/dL   Total Protein 6.5 6.1 - 8.1 g/dL   Albumin 4.0 3.6 - 5.1 g/dL   Globulin 2.5 1.9 - 3.7 g/dL (calc)   AG Ratio 1.6 1.0 - 2.5 (calc)   Total Bilirubin 0.3 0.2 - 1.2 mg/dL   Alkaline phosphatase (APISO) 74 33 - 130 U/L   AST 13 10 - 35 U/L   ALT 8 6 - 29 U/L  Hemoglobin A1c     Status: Abnormal     Collection Time: 04/23/18 12:04 PM  Result Value Ref Range   Hgb A1c MFr Bld 6.8 (H) <5.7 % of total Hgb    Comment: For someone without known diabetes, a hemoglobin A1c value of 6.5% or greater indicates that they may have  diabetes and this should be confirmed with a follow-up  test. . For someone with known diabetes, a value <7% indicates  that their diabetes is well controlled and a value  greater than or equal to 7% indicates suboptimal  control. A1c targets should be individualized based on  duration of diabetes, age, comorbid conditions, and  other considerations. . Currently, no consensus exists regarding use of hemoglobin A1c for diagnosis of diabetes for children. .    Mean Plasma Glucose 148 (calc)   eAG (mmol/L) 8.2 (calc)  CBC with Differential/Platelet     Status: None   Collection Time: 04/23/18 12:04 PM  Result Value Ref Range   WBC 9.2 3.8 - 10.8 Thousand/uL   RBC 4.76 3.80 - 5.10 Million/uL   Hemoglobin 13.1 11.7 - 15.5 g/dL   HCT 40.9 81.1 - 91.4 %   MCV 83.4 80.0 - 100.0 fL   MCH 27.5 27.0 - 33.0 pg   MCHC 33.0 32.0 - 36.0 g/dL   RDW 78.2 95.6 - 21.3 %   Platelets 202 140 - 400 Thousand/uL   MPV 9.5 7.5 - 12.5 fL   Neutro Abs 7,066 1,500 - 7,800 cells/uL   Lymphs Abs 1,205 850 - 3,900 cells/uL   WBC mixed population 644 200 - 950 cells/uL   Eosinophils Absolute 202 15 - 500 cells/uL   Basophils Absolute 83 0 - 200 cells/uL   Neutrophils Relative % 76.8 %   Total Lymphocyte 13.1 %   Monocytes Relative 7.0 %   Eosinophils Relative 2.2 %   Basophils Relative 0.9 %  BUN     Status: None   Collection Time: 05/26/18  8:16 AM  Result Value Ref Range   BUN 19 8 - 23 mg/dL    Comment: Performed at [facility], [address] [place], [place] [address]  Creatinine, serum     Status: None   Collection Time: 05/26/18  8:16 AM  Result Value Ref Range   Creatinine, Ser 0.77 0.44 - 1.00 mg/dL   GFR calc non Af Amer >60 >60 mL/min   GFR calc Af  Amer >60 >60 mL/min    Comment: Performed at [facility], [address], [place], [place] [address]  NM Myocar Multi W/Spect W/Wall Motion / EF     Status: None   Collection Time: 06/03/18  2:11 PM  Result Value Ref Range   Rest HR 64 bpm   Rest BP 156/69 mmHg   Exercise duration (sec) 0 sec   Percent HR 57 %   Exercise duration (min) 0 min   Estimated workload 1.0 METS   Peak HR 85 bpm   Peak BP 157/66 mmHg   MPHR 149 bpm   SSS 2    SRS 4    SDS 1    TID 0.63    LV sys vol 12 mL   LV dias vol 46 46 - 106 mL  Lipid panel     Status: Abnormal   Collection Time: 06/05/18 12:32 PM  Result Value Ref Range   Cholesterol 132 <200 mg/dL   HDL 45 (L) >40>50 mg/dL   Triglycerides 981133 <191<150 mg/dL   LDL Cholesterol (Calc) 66 mg/dL (calc)    Comment: Reference range: <100 . Desirable range <100 mg/dL for primary prevention;   <70 mg/dL for patients with CHD or diabetic patients  with > or = 2 CHD risk factors. Marland Kitchen. LDL-C is now calculated using the Martin-Hopkins  calculation, which is a validated novel method providing  better accuracy than the Friedewald equation in the  estimation of LDL-C.  Horald PollenMartin SS et al. Lenox AhrJAMA. 4782;956(212013;310(19): 2061-2068  (http://education.[facility].com/faq/FAQ164)    Total CHOL/HDL Ratio 2.9 <5.0 (calc)   Non-HDL Cholesterol (Calc) 87 <308<130 mg/dL (calc)    Comment: For patients with diabetes plus 1 major ASCVD risk  factor, treating to a non-HDL-C goal of <100 mg/dL  (LDL-C of <65<70 mg/dL) is considered a therapeutic  option.     Radiology Nm Myocar Multi W/spect W/wall Motion / Ef  Result Date: 06/03/2018 Pharmacological myocardial perfusion imaging study with no significant  ischemia Normal wall motion, EF estimated at 82% No EKG changes concerning for ischemia at peak stress or in recovery. Low risk scan Signed, Dossie Arbourim Gollan, MD, Ph.D [facility]   [place] Carotid  Result Date: 06/05/2018 Carotid Arterial Duplex Study Indications:        Bilateral carotid bruits. Risk Factors:      Hypertension, hyperlipidemia, past history of smoking,                    coronary artery disease, PAD. Other Factors:     Patient denies any cerebrovascular symptoms. Comparison Study:  No previous carotid duplex on record Performing Technologist: Quentin OreGary Joseph RDMS, RVT, RDCS  Examination Guidelines: A complete evaluation includes B-mode imaging, spectral Doppler, color Doppler, and power Doppler as needed of all accessible portions of each vessel. Bilateral testing is considered an integral part of a complete examination. Limited examinations for reoccurring indications may be performed as noted.  Right Carotid Findings: +----------+--------+--------+--------+-----------------------------+--------+           PSV cm/sEDV cm/sStenosisDescribe                     Comments +----------+--------+--------+--------+-----------------------------+--------+ CCA Prox  80      16                                                    +----------+--------+--------+--------+-----------------------------+--------+ CCA Distal78      12              focal, hyperechoic and smooth         +----------+--------+--------+--------+-----------------------------+--------+ ICA Prox  126  25      1-39%   focal, hyperechoic and smooth         +----------+--------+--------+--------+-----------------------------+--------+ ICA Mid   131     27      1-39%                                         +----------+--------+--------+--------+-----------------------------+--------+ ICA Distal92      24                                                    +----------+--------+--------+--------+-----------------------------+--------+ ECA       106     11                                                    +----------+--------+--------+--------+-----------------------------+--------+ +----------+--------+-------+----------------+-------------------+           PSV  cm/sEDV cmsDescribe        Arm Pressure (mmHG) +----------+--------+-------+----------------+-------------------+ Subclavian70      2      Multiphasic, WNL                    +----------+--------+-------+----------------+-------------------+ +---------+--------+--+--------+--+---------+ VertebralPSV cm/s71EDV cm/s18Antegrade +---------+--------+--+--------+--+---------+  Left Carotid Findings: +----------+--------+--------+--------+----------------------+--------+           PSV cm/sEDV cm/sStenosisDescribe              Comments +----------+--------+--------+--------+----------------------+--------+ CCA Prox  90      16                                             +----------+--------+--------+--------+----------------------+--------+ CCA Distal76      16                                             +----------+--------+--------+--------+----------------------+--------+ ICA Prox  201     27      1-39%   hyperechoic and smooth         +----------+--------+--------+--------+----------------------+--------+ ICA Mid   178     35      1-39%   hyperechoic and smooth         +----------+--------+--------+--------+----------------------+--------+ ICA Distal147     29              hyperechoic and smooth         +----------+--------+--------+--------+----------------------+--------+ ECA       180     19                                             +----------+--------+--------+--------+----------------------+--------+ +----------+--------+--------+----------------+-------------------+ SubclavianPSV cm/sEDV cm/sDescribe        Arm Pressure (mmHG) +----------+--------+--------+----------------+-------------------+           117     0       Multiphasic, WNL                    +----------+--------+--------+----------------+-------------------+ +---------+--------+--------+--------------+  VertebralPSV cm/sEDV cm/sNot identified  +---------+--------+--------+--------------+  Summary: Right Carotid: Velocities in the right ICA are consistent with a 1-39% stenosis.                Non-hemodynamically significant plaque <50% noted in the CCA. The                ECA appears <50% stenosed. Left Carotid: Velocities in the left ICA are consistent with a 1-39% stenosis.               Non-hemodynamically significant plaque noted in the CCA. The ECA               appears <50% stenosed. Abundant plaque seen throughout, with               velocities at the high-end of 1-39% stenotic. Vertebrals:  Right vertebral artery demonstrates antegrade flow. Left vertebral              artery demonstrates an occlusion. Subclavians: Normal flow hemodynamics were seen in bilateral subclavian              arteries. *See table(s) above for measurements and observations. Suggest follow up study in 61-month due to abundant plaque and velocities at the high end of 1-39%. Electronically signed by Nanetta Batty MD on 06/05/2018 at 10:15:50 AM.    Final     Assessment/Plan Hypertension goal BP (blood pressure) < 140/90 blood pressure control important in reducing the progression of atherosclerotic disease. On appropriate oral medications.   Diabetes mellitus type II, controlled, with no complications (HCC) blood glucose control important in reducing the progression of atherosclerotic disease. Also, involved in wound healing. On appropriate medications.   Hyperlipidemia LDL goal <100 lipid control important in reducing the progression of atherosclerotic disease. Continue statin therapy. Her cholesterol is markedly improved after the addition of a statin.   Hx of smoking Says she has basically quit but does occasionally smoke.  Discussed that this was a major risk factor for the development of atherosclerotic peripheral arterial disease and cessation would be of benefit.  Atherosclerosis of native arteries of extremity with intermittent claudication  (HCC) ABIs today are 0.57 on the right and 0.43 on the left.  Her angiogram demonstrated disease that will require femoral endarterectomy and potential femoral to popliteal/distal bypass versus femoral endarterectomy with endovascular revascularization of the SFA and tibioperoneal trunk.  The latter would be less traumatic and will be our preferred option if technically feasible.  Her right leg will need to be delayed until after we complete her left leg treatment and the wounds are all healed.  The patient voices her understanding.  She has had her preoperative testing and her surgery is scheduled for about 2 weeks.    Festus Barren, MD  06/19/2018 4:01 PM    This note was created with Dragon medical transcription system.  Any errors from dictation are purely unintentional

## 2018-06-19 NOTE — Assessment & Plan Note (Signed)
ABIs today are 0.57 on the right and 0.43 on the left.  Her angiogram demonstrated disease that will require femoral endarterectomy and potential femoral to popliteal/distal bypass versus femoral endarterectomy with endovascular revascularization of the SFA and tibioperoneal trunk.  The latter would be less traumatic and will be our preferred option if technically feasible.  Her right leg will need to be delayed until after we complete her left leg treatment and the wounds are all healed.  The patient voices her understanding.  She has had her preoperative testing and her surgery is scheduled for about 2 weeks.

## 2018-06-19 NOTE — Patient Instructions (Signed)
Peripheral Vascular Disease  Peripheral vascular disease (PVD) is a disease of the blood vessels that are not part of your heart and brain. A simple term for PVD is poor circulation. In most cases, PVD narrows the blood vessels that carry blood from your heart to the rest of your body. This can reduce the supply of blood to your arms, legs, and internal organs, like your stomach or kidneys. However, PVD most often affects a person's lower legs and feet. Without treatment, PVD tends to get worse. PVD can also lead to acute ischemic limb. This is when an arm or leg suddenly cannot get enough blood. This is a medical emergency. Follow these instructions at home: Lifestyle  Do not use any products that contain nicotine or tobacco, such as cigarettes and e-cigarettes. If you need help quitting, ask your doctor.  Lose weight if you are overweight. Or, stay at a healthy weight as told by your doctor.  Eat a diet that is low in fat and cholesterol. If you need help, ask your doctor.  Exercise regularly. Ask your doctor for activities that are right for you. General instructions  Take over-the-counter and prescription medicines only as told by your doctor.  Take good care of your feet: ? Wear comfortable shoes that fit well. ? Check your feet often for any cuts or sores.  Keep all follow-up visits as told by your doctor This is important. Contact a doctor if:  You have cramps in your legs when you walk.  You have leg pain when you are at rest.  You have coldness in a leg or foot.  Your skin changes.  You are unable to get or have an erection (erectile dysfunction).  You have cuts or sores on your feet that do not heal. Get help right away if:  Your arm or leg turns cold, numb, and blue.  Your arms or legs become red, warm, swollen, painful, or numb.  You have chest pain.  You have trouble breathing.  You suddenly have weakness in your face, arm, or leg.  You become very  confused or you cannot speak.  You suddenly have a very bad headache.  You suddenly cannot see. Summary  Peripheral vascular disease (PVD) is a disease of the blood vessels.  A simple term for PVD is poor circulation. Without treatment, PVD tends to get worse.  Treatment may include exercise, low fat and low cholesterol diet, and quitting smoking. This information is not intended to replace advice given to you by your health care provider. Make sure you discuss any questions you have with your health care provider. Document Released: 09/11/2009 Document Revised: 07/25/2016 Document Reviewed: 07/25/2016 Elsevier Interactive Patient Education  2019 [employer]  

## 2018-06-26 ENCOUNTER — Other Ambulatory Visit (INDEPENDENT_AMBULATORY_CARE_PROVIDER_SITE_OTHER): Payer: Self-pay | Admitting: Nurse Practitioner

## 2018-06-29 ENCOUNTER — Encounter
Admission: RE | Admit: 2018-06-29 | Discharge: 2018-06-29 | Disposition: A | Discharge status: Home / Self Care | Payer: Medicare HMO | Source: Ambulatory Visit | Attending: Vascular Surgery | Admitting: Vascular Surgery

## 2018-06-29 ENCOUNTER — Other Ambulatory Visit: Payer: Self-pay

## 2018-06-29 DIAGNOSIS — Z01812 Encounter for preprocedural laboratory examination: Secondary | ICD-10-CM | POA: Diagnosis not present

## 2018-06-29 LAB — SURGICAL PCR SCREEN
MRSA, PCR: NEGATIVE
Staphylococcus aureus: NEGATIVE

## 2018-06-29 LAB — CBC WITH DIFFERENTIAL/PLATELET
Abs Immature Granulocytes: 0.04 10*3/uL (ref 0.00–0.07)
Basophils Absolute: 0 10*3/uL (ref 0.0–0.1)
Basophils Relative: 1 %
Eosinophils Absolute: 0.2 10*3/uL (ref 0.0–0.5)
Eosinophils Relative: 2 %
HCT: 40 % (ref 36.0–46.0)
Hemoglobin: 12.8 g/dL (ref 12.0–15.0)
Immature Granulocytes: 1 %
Lymphocytes Relative: 15 %
Lymphs Abs: 1.3 10*3/uL (ref 0.7–4.0)
MCH: 27.7 pg (ref 26.0–34.0)
MCHC: 32 g/dL (ref 30.0–36.0)
MCV: 86.6 fL (ref 80.0–100.0)
Monocytes Absolute: 0.5 10*3/uL (ref 0.1–1.0)
Monocytes Relative: 6 %
Neutro Abs: 6.4 10*3/uL (ref 1.7–7.7)
Neutrophils Relative %: 75 %
Platelets: 192 10*3/uL (ref 150–400)
RBC: 4.62 MIL/uL (ref 3.87–5.11)
RDW: 13.6 % (ref 11.5–15.5)
WBC: 8.4 10*3/uL (ref 4.0–10.5)
nRBC: 0 % (ref 0.0–0.2)

## 2018-06-29 LAB — PROTIME-INR
INR: 0.91
Prothrombin Time: 12.2 seconds (ref 11.4–15.2)

## 2018-06-29 LAB — BASIC METABOLIC PANEL
Anion gap: 9 (ref 5–15)
BUN: 18 mg/dL (ref 8–23)
CO2: 24 mmol/L (ref 22–32)
Calcium: 9.4 mg/dL (ref 8.9–10.3)
Chloride: 104 mmol/L (ref 98–111)
Creatinine, Ser: 0.73 mg/dL (ref 0.44–1.00)
GFR calc Af Amer: >60 mL/min (ref >60)
GFR calc non Af Amer: >60 mL/min (ref >60)
Glucose, Bld: 178 mg/dL — ABNORMAL HIGH (ref 70–99)
Potassium: 4.5 mmol/L (ref 3.5–5.1)
Sodium: 137 mmol/L (ref 135–145)

## 2018-06-29 LAB — APTT: aPTT: 31 seconds (ref 24–36)

## 2018-06-29 LAB — TYPE AND SCREEN
ABO/RH(D): O POS
Antibody Screen: NEGATIVE

## 2018-06-30 NOTE — Patient Instructions (Addendum)
Your procedure is scheduled on: Wed. 07/08/18 Report to Day Surgery. To find out your arrival time please call [phone] between 1PM - 3PM on Tues.07/07/18  Remember: Instructions that are not followed completely may result in serious medical risk,  up to and including death, or upon the discretion of your surgeon and anesthesiologist your  surgery may need to be rescheduled.     _X__ 1. Do not eat food after midnight the night before your procedure.                 No gum chewing or hard candies. You may drink clear liquids up to 2 hours                 before you are scheduled to arrive for your surgery- DO not drink clear                 liquids within 2 hours of the start of your surgery.                 Clear Liquids include:  water, apple juice without pulp, clear carbohydrate                 drink such as Clearfast of Gatorade, Black Coffee or Tea (Do not add                 anything to coffee or tea).  __X__2.  On the morning of surgery brush your teeth with toothpaste and water, you                may rinse your mouth with mouthwash if you wish.  Do not swallow any toothpaste of mouthwash.     _X__ 3.  No Alcohol for 24 hours before or after surgery.   _X__ 4.  Do Not Smoke or use e-cigarettes For 24 Hours Prior to Your Surgery.                 Do not use any chewable tobacco products for at least 6 hours prior to                 surgery.  ____  5.  Bring all medications with you on the day of surgery if instructed.   __x__  6.  Notify your doctor if there is any change in your medical condition      (cold, fever, infections).     Do not wear jewelry, make-up, hairpins, clips or nail polish. Do not wear lotions, powders, or perfumes. You may wear deodorant. Do not shave 48 hours prior to surgery. Men may shave face and neck. Do not bring valuables to the hospital.    [facility] is not responsible for any belongings or valuables.  Contacts,  dentures or bridgework may not be worn into surgery. Leave your suitcase in the car. After surgery it may be brought to your room. For patients admitted to the hospital, discharge time is determined by your treatment team.   Patients discharged the day of surgery will not be allowed to drive home.   Please read over the following fact sheets that you were given:    _x___ Take these medicines the morning of surgery with A SIP OF WATER:    1. HYDROcodone-acetaminophen (NORCO) 5-325 MG tablet if needed  2. ranitidine (ZANTAC) 300 MG tablet  3. sertraline (ZOLOFT) 100 MG tablet  4.gabapentin (NEURONTIN) 400 MG capsule  5.  6.  ____ Fleet Enema (as  directed)   ____ Use CHG Soap as directed  _x___ Use inhalers on the day of surgery albuterol (PROVENTIL HFA;VENTOLIN HFA) 108 (90 Base) MCG/ACT inhaler  ____ Stop metformin 2 days prior to surgery    ____ Take 1/2 of usual insulin dose the night before surgery. No insulin the morning          of surgery.   ____ Stop Coumadin/Plavix/aspirin on   _x___ Stop Anti-inflammatories     x____ Stop supplements until after surgery.  Melatonin 5 MG TABS,vitamin C (ASCORBIC ACID) 500 MG tablet, co-enzyme Q-10 30 MG capsule  ____ Bring C-Pap to the hospital.    Called instructions to patient per Victory DakinM Yvonne Stopher RN

## 2018-07-02 ENCOUNTER — Other Ambulatory Visit: Payer: Self-pay

## 2018-07-02 ENCOUNTER — Other Ambulatory Visit: Payer: Self-pay | Admitting: Family Medicine

## 2018-07-02 NOTE — Telephone Encounter (Signed)
Lab Results  Component Value Date   CREATININE 0.73 06/29/2018   Lab Results  Component Value Date   K 4.5 06/29/2018

## 2018-07-02 NOTE — Pre-Procedure Instructions (Signed)
Spoke with patient via phone to confirm her preop medications. She called to [facility] to ask if the meds she was instructed to take was accurate.  She was concerned about taking Ranitidine due to the recent call back. I discussed that she could call her ordering physician if she had concerns about that.  Patient reinforced to call [facility] the day prior to surgery to find out arrival time.

## 2018-07-06 ENCOUNTER — Ambulatory Visit: Payer: Medicare HMO | Admitting: Cardiovascular Disease

## 2018-07-07 ENCOUNTER — Telehealth: Payer: Self-pay | Admitting: Family Medicine

## 2018-07-07 MED ORDER — CEFAZOLIN SODIUM-DEXTROSE 2-4 GM/100ML-% IV SOLN
2.0000 g | INTRAVENOUS | Status: AC
  Administered 2018-07-08: 1 g via INTRAVENOUS
  Administered 2018-07-08: 2 g via INTRAVENOUS

## 2018-07-07 MED ORDER — FAMOTIDINE 20 MG PO TABS: 20.0000 mg | ORAL_TABLET | Freq: Two times a day (BID) | ORAL | 2 refills | Status: DC | PRN

## 2018-07-07 NOTE — Telephone Encounter (Signed)
Pt.notified

## 2018-07-07 NOTE — Telephone Encounter (Signed)
I have no control over what is given in the hospital I removed ranitidine from her med list and sent a prescription for famotidine We hope her procedure goes well

## 2018-07-07 NOTE — Telephone Encounter (Signed)
Copied from CRM [id number]. Topic: Quick Communication - See Telephone Encounter >> Jul 07, 2018  1:28 PM Terisa Starr wrote: CRM for notification. See Telephone encounter for: 07/07/18.  Patient states that ranitidine (ZANTAC) 300 MG tablet is causing cancer and she even checked with her pharmacy. She would like to be switched to Pepcid. She is having surgery tomorrow and will be in the hospital, she wants to make sure they give her Pepcid instead of Zantac. Please Advise.  [facility] - [place] - [address] [place] [address]

## 2018-07-08 ENCOUNTER — Inpatient Hospital Stay
Admission: RE | Admit: 2018-07-08 | Discharge: 2018-07-10 | DRG: 254 | Disposition: A | Discharge status: Home Health | Payer: Medicare HMO | Attending: Vascular Surgery | Admitting: Vascular Surgery

## 2018-07-08 ENCOUNTER — Inpatient Hospital Stay: Payer: Medicare HMO | Admitting: Certified Registered"

## 2018-07-08 ENCOUNTER — Encounter
Admission: RE | Disposition: A | Discharge status: Home Health | Payer: Self-pay | Source: Home / Self Care | Attending: Vascular Surgery

## 2018-07-08 ENCOUNTER — Other Ambulatory Visit: Payer: Self-pay

## 2018-07-08 ENCOUNTER — Inpatient Hospital Stay: Payer: Medicare HMO

## 2018-07-08 DIAGNOSIS — E785 Hyperlipidemia, unspecified: Secondary | ICD-10-CM | POA: Diagnosis present

## 2018-07-08 DIAGNOSIS — I10 Essential (primary) hypertension: Secondary | ICD-10-CM | POA: Diagnosis present

## 2018-07-08 DIAGNOSIS — Z79891 Long term (current) use of opiate analgesic: Secondary | ICD-10-CM

## 2018-07-08 DIAGNOSIS — Z8261 Family history of arthritis: Secondary | ICD-10-CM

## 2018-07-08 DIAGNOSIS — Z818 Family history of other mental and behavioral disorders: Secondary | ICD-10-CM

## 2018-07-08 DIAGNOSIS — H9190 Unspecified hearing loss, unspecified ear: Secondary | ICD-10-CM | POA: Diagnosis not present

## 2018-07-08 DIAGNOSIS — Z833 Family history of diabetes mellitus: Secondary | ICD-10-CM | POA: Diagnosis not present

## 2018-07-08 DIAGNOSIS — Z8349 Family history of other endocrine, nutritional and metabolic diseases: Secondary | ICD-10-CM | POA: Diagnosis not present

## 2018-07-08 DIAGNOSIS — J45909 Unspecified asthma, uncomplicated: Secondary | ICD-10-CM | POA: Diagnosis not present

## 2018-07-08 DIAGNOSIS — Z79899 Other long term (current) drug therapy: Secondary | ICD-10-CM | POA: Diagnosis not present

## 2018-07-08 DIAGNOSIS — Z888 Allergy status to other drugs, medicaments and biological substances status: Secondary | ICD-10-CM

## 2018-07-08 DIAGNOSIS — Z90710 Acquired absence of both cervix and uterus: Secondary | ICD-10-CM | POA: Diagnosis not present

## 2018-07-08 DIAGNOSIS — Z7951 Long term (current) use of inhaled steroids: Secondary | ICD-10-CM | POA: Diagnosis not present

## 2018-07-08 DIAGNOSIS — I70222 Atherosclerosis of native arteries of extremities with rest pain, left leg: Secondary | ICD-10-CM | POA: Diagnosis not present

## 2018-07-08 DIAGNOSIS — Z8249 Family history of ischemic heart disease and other diseases of the circulatory system: Secondary | ICD-10-CM

## 2018-07-08 DIAGNOSIS — I70212 Atherosclerosis of native arteries of extremities with intermittent claudication, left leg: Secondary | ICD-10-CM | POA: Diagnosis not present

## 2018-07-08 DIAGNOSIS — Z823 Family history of stroke: Secondary | ICD-10-CM | POA: Diagnosis not present

## 2018-07-08 DIAGNOSIS — Z9884 Bariatric surgery status: Secondary | ICD-10-CM

## 2018-07-08 DIAGNOSIS — I110 Hypertensive heart disease with heart failure: Secondary | ICD-10-CM | POA: Diagnosis not present

## 2018-07-08 DIAGNOSIS — I509 Heart failure, unspecified: Secondary | ICD-10-CM | POA: Diagnosis not present

## 2018-07-08 DIAGNOSIS — I70229 Atherosclerosis of native arteries of extremities with rest pain, unspecified extremity: Secondary | ICD-10-CM | POA: Diagnosis present

## 2018-07-08 DIAGNOSIS — E1151 Type 2 diabetes mellitus with diabetic peripheral angiopathy without gangrene: Secondary | ICD-10-CM | POA: Diagnosis present

## 2018-07-08 DIAGNOSIS — F17210 Nicotine dependence, cigarettes, uncomplicated: Secondary | ICD-10-CM | POA: Diagnosis present

## 2018-07-08 DIAGNOSIS — Z825 Family history of asthma and other chronic lower respiratory diseases: Secondary | ICD-10-CM | POA: Diagnosis not present

## 2018-07-08 HISTORY — PX: LOWER EXTREMITY ANGIOGRAM: SHX5508

## 2018-07-08 HISTORY — PX: ENDARTERECTOMY FEMORAL: SHX5804

## 2018-07-08 LAB — GLUCOSE, CAPILLARY
Glucose-Capillary: 146 mg/dL — ABNORMAL HIGH (ref 70–99)
Glucose-Capillary: 188 mg/dL — ABNORMAL HIGH (ref 70–99)
Glucose-Capillary: 174 mg/dL — ABNORMAL HIGH (ref 70–99)

## 2018-07-08 LAB — CBC
HCT: 38.7 % (ref 36.0–46.0)
Hemoglobin: 12 g/dL (ref 12.0–15.0)
MCH: 27.3 pg (ref 26.0–34.0)
MCHC: 31 g/dL (ref 30.0–36.0)
MCV: 88 fL (ref 80.0–100.0)
Platelets: 162 10*3/uL (ref 150–400)
RBC: 4.4 MIL/uL (ref 3.87–5.11)
RDW: 13.4 % (ref 11.5–15.5)
WBC: 12.6 10*3/uL — ABNORMAL HIGH (ref 4.0–10.5)
nRBC: 0 % (ref 0.0–0.2)

## 2018-07-08 LAB — CREATININE, SERUM
Creatinine, Ser: 0.88 mg/dL (ref 0.44–1.00)
GFR calc Af Amer: >60 mL/min (ref >60)
GFR calc non Af Amer: >60 mL/min (ref >60)

## 2018-07-08 SURGERY — ENDARTERECTOMY, FEMORAL
Anesthesia: General | Laterality: Left

## 2018-07-08 MED ORDER — BUPIVACAINE HCL (PF) 0.5 % IJ SOLN
INTRAMUSCULAR | Status: AC
  Filled 2018-07-08: qty 30

## 2018-07-08 MED ORDER — CEFAZOLIN SODIUM-DEXTROSE 2-4 GM/100ML-% IV SOLN
INTRAVENOUS | Status: AC
  Filled 2018-07-08: qty 100

## 2018-07-08 MED ORDER — OXYCODONE-ACETAMINOPHEN 5-325 MG PO TABS
1.0000 | ORAL_TABLET | ORAL | Status: DC | PRN
  Administered 2018-07-09 (×2): 2 via ORAL
  Filled 2018-07-08 (×2): qty 2

## 2018-07-08 MED ORDER — ACETAMINOPHEN 325 MG PO TABS: 325.0000 mg | ORAL_TABLET | ORAL | Status: DC | PRN

## 2018-07-08 MED ORDER — POTASSIUM CHLORIDE CRYS ER 20 MEQ PO TBCR: 20.0000 meq | EXTENDED_RELEASE_TABLET | Freq: Every day | ORAL | Status: DC | PRN

## 2018-07-08 MED ORDER — MAGNESIUM SULFATE 2 GM/50ML IV SOLN: 2.0000 g | Freq: Every day | INTRAVENOUS | Status: DC | PRN

## 2018-07-08 MED ORDER — LIDOCAINE HCL (PF) 2 % IJ SOLN
INTRAMUSCULAR | Status: AC
  Filled 2018-07-08: qty 10

## 2018-07-08 MED ORDER — LISINOPRIL 10 MG PO TABS
10.0000 mg | ORAL_TABLET | Freq: Every day | ORAL | Status: DC
  Administered 2018-07-08 – 2018-07-10 (×3): 10 mg via ORAL
  Filled 2018-07-08 (×3): qty 1

## 2018-07-08 MED ORDER — HYDROMORPHONE HCL 1 MG/ML IJ SOLN
INTRAMUSCULAR | Status: AC
  Filled 2018-07-08: qty 1

## 2018-07-08 MED ORDER — DEXAMETHASONE SODIUM PHOSPHATE 10 MG/ML IJ SOLN
INTRAMUSCULAR | Status: DC | PRN
  Administered 2018-07-08: 5 mg via INTRAVENOUS

## 2018-07-08 MED ORDER — DOPAMINE-DEXTROSE 3.2-5 MG/ML-% IV SOLN
3.0000 ug/kg/min | INTRAVENOUS | Status: DC
  Filled 2018-07-08: qty 250

## 2018-07-08 MED ORDER — SODIUM CHLORIDE 0.9 % IV SOLN
INTRAVENOUS | Status: DC | PRN
  Administered 2018-07-08: 10 ug/min via INTRAVENOUS

## 2018-07-08 MED ORDER — ROCURONIUM BROMIDE 100 MG/10ML IV SOLN
INTRAVENOUS | Status: DC | PRN
  Administered 2018-07-08: 50 mg via INTRAVENOUS

## 2018-07-08 MED ORDER — PHENYLEPHRINE HCL 10 MG/ML IJ SOLN
INTRAMUSCULAR | Status: DC | PRN
  Administered 2018-07-08 (×3): 100 ug via INTRAVENOUS

## 2018-07-08 MED ORDER — SENNOSIDES-DOCUSATE SODIUM 8.6-50 MG PO TABS: 1.0000 | ORAL_TABLET | Freq: Every evening | ORAL | Status: DC | PRN

## 2018-07-08 MED ORDER — MIDAZOLAM HCL 2 MG/2ML IJ SOLN
INTRAMUSCULAR | Status: AC
  Filled 2018-07-08: qty 2

## 2018-07-08 MED ORDER — ENOXAPARIN SODIUM 40 MG/0.4ML ~~LOC~~ SOLN
40.0000 mg | SUBCUTANEOUS | Status: DC
  Administered 2018-07-09 – 2018-07-10 (×2): 40 mg via SUBCUTANEOUS
  Filled 2018-07-08 (×2): qty 0.4

## 2018-07-08 MED ORDER — ONDANSETRON HCL 4 MG/2ML IJ SOLN
INTRAMUSCULAR | Status: AC
  Filled 2018-07-08: qty 2

## 2018-07-08 MED ORDER — EVICEL 5 ML EX KIT
PACK | CUTANEOUS | Status: DC | PRN
  Administered 2018-07-08: 5 mL

## 2018-07-08 MED ORDER — CALCIUM CARBONATE ANTACID 500 MG PO CHEW
3.0000 | CHEWABLE_TABLET | Freq: Every day | ORAL | Status: DC
  Administered 2018-07-08 – 2018-07-09 (×2): 600 mg via ORAL
  Filled 2018-07-08 (×3): qty 3

## 2018-07-08 MED ORDER — ONDANSETRON HCL 4 MG/2ML IJ SOLN
INTRAMUSCULAR | Status: DC | PRN
  Administered 2018-07-08: 4 mg via INTRAVENOUS

## 2018-07-08 MED ORDER — HYDROMORPHONE HCL 1 MG/ML IJ SOLN
INTRAMUSCULAR | Status: DC | PRN
  Administered 2018-07-08 (×2): .4 mg via INTRAVENOUS

## 2018-07-08 MED ORDER — EVICEL 5 ML EX KIT
PACK | CUTANEOUS | Status: AC
  Filled 2018-07-08: qty 1

## 2018-07-08 MED ORDER — ESMOLOL HCL-SODIUM CHLORIDE 2000 MG/100ML IV SOLN
INTRAVENOUS | Status: AC
  Administered 2018-07-08: 25 ug/kg/min via INTRAVENOUS
  Filled 2018-07-08: qty 100

## 2018-07-08 MED ORDER — GABAPENTIN 400 MG PO CAPS
ORAL_CAPSULE | ORAL | Status: AC
  Filled 2018-07-08: qty 1

## 2018-07-08 MED ORDER — ROCURONIUM BROMIDE 50 MG/5ML IV SOLN
INTRAVENOUS | Status: AC
  Filled 2018-07-08: qty 1

## 2018-07-08 MED ORDER — EPHEDRINE SULFATE 50 MG/ML IJ SOLN
INTRAMUSCULAR | Status: DC | PRN
  Administered 2018-07-08: 7.5 mg via INTRAVENOUS

## 2018-07-08 MED ORDER — ACETAMINOPHEN 325 MG PO TABS: 325.0000 mg | ORAL_TABLET | Freq: Four times a day (QID) | ORAL | Status: DC | PRN

## 2018-07-08 MED ORDER — HEPARIN SODIUM (PORCINE) 5000 UNIT/ML IJ SOLN
INTRAMUSCULAR | Status: AC
  Filled 2018-07-08: qty 1

## 2018-07-08 MED ORDER — CEFAZOLIN SODIUM-DEXTROSE 2-4 GM/100ML-% IV SOLN
2.0000 g | Freq: Three times a day (TID) | INTRAVENOUS | Status: AC
  Administered 2018-07-08 – 2018-07-09 (×2): 2 g via INTRAVENOUS
  Filled 2018-07-08 (×2): qty 100

## 2018-07-08 MED ORDER — NEOSTIGMINE METHYLSULFATE 10 MG/10ML IV SOLN
INTRAVENOUS | Status: DC | PRN
  Administered 2018-07-08: 2.5 mg via INTRAVENOUS

## 2018-07-08 MED ORDER — VITAMIN C 500 MG PO TABS
500.0000 mg | ORAL_TABLET | ORAL | Status: DC
  Administered 2018-07-08 – 2018-07-10 (×2): 500 mg via ORAL
  Filled 2018-07-08 (×3): qty 1

## 2018-07-08 MED ORDER — IOPAMIDOL (ISOVUE-300) INJECTION 61%
INTRAVENOUS | Status: DC | PRN
  Administered 2018-07-08: 40 mL

## 2018-07-08 MED ORDER — NITROGLYCERIN IN D5W 200-5 MCG/ML-% IV SOLN
5.0000 ug/min | INTRAVENOUS | Status: DC
  Filled 2018-07-08: qty 250

## 2018-07-08 MED ORDER — GLYCOPYRROLATE 0.2 MG/ML IJ SOLN
INTRAMUSCULAR | Status: DC | PRN
  Administered 2018-07-08: .4 mg via INTRAVENOUS

## 2018-07-08 MED ORDER — METOPROLOL TARTRATE 5 MG/5ML IV SOLN: 2.0000 mg | INTRAVENOUS | Status: DC | PRN

## 2018-07-08 MED ORDER — ACETAMINOPHEN 650 MG RE SUPP: 325.0000 mg | RECTAL | Status: DC | PRN

## 2018-07-08 MED ORDER — MORPHINE SULFATE (PF) 2 MG/ML IV SOLN
2.0000 mg | INTRAVENOUS | Status: DC | PRN
  Administered 2018-07-08: 2 mg via INTRAVENOUS
  Filled 2018-07-08: qty 2

## 2018-07-08 MED ORDER — PROPOFOL 10 MG/ML IV BOLUS
INTRAVENOUS | Status: AC
  Filled 2018-07-08: qty 20

## 2018-07-08 MED ORDER — SODIUM CHLORIDE 0.9 % IV SOLN
INTRAVENOUS | Status: DC
  Administered 2018-07-08: 07:00:00 via INTRAVENOUS

## 2018-07-08 MED ORDER — HYDROMORPHONE HCL 1 MG/ML IJ SOLN: 0.2500 mg | INTRAMUSCULAR | Status: DC | PRN

## 2018-07-08 MED ORDER — GUAIFENESIN-DM 100-10 MG/5ML PO SYRP: 15.0000 mL | ORAL_SOLUTION | ORAL | Status: DC | PRN

## 2018-07-08 MED ORDER — BUPIVACAINE LIPOSOME 1.3 % IJ SUSP
INTRAMUSCULAR | Status: AC
  Filled 2018-07-08: qty 20

## 2018-07-08 MED ORDER — HEPARIN SODIUM (PORCINE) 1000 UNIT/ML IJ SOLN
INTRAMUSCULAR | Status: AC
  Filled 2018-07-08: qty 1

## 2018-07-08 MED ORDER — ATORVASTATIN CALCIUM 10 MG PO TABS
10.0000 mg | ORAL_TABLET | Freq: Every day | ORAL | Status: DC
  Administered 2018-07-08 – 2018-07-09 (×2): 10 mg via ORAL
  Filled 2018-07-08 (×2): qty 1

## 2018-07-08 MED ORDER — GABAPENTIN 400 MG PO CAPS
400.0000 mg | ORAL_CAPSULE | Freq: Two times a day (BID) | ORAL | Status: DC
  Administered 2018-07-08 – 2018-07-10 (×4): 400 mg via ORAL
  Filled 2018-07-08 (×4): qty 1

## 2018-07-08 MED ORDER — LABETALOL HCL 5 MG/ML IV SOLN: 10.0000 mg | INTRAVENOUS | Status: DC | PRN

## 2018-07-08 MED ORDER — DOCUSATE SODIUM 100 MG PO CAPS
100.0000 mg | ORAL_CAPSULE | Freq: Every day | ORAL | Status: DC
  Administered 2018-07-09 – 2018-07-10 (×2): 100 mg via ORAL
  Filled 2018-07-08 (×2): qty 1

## 2018-07-08 MED ORDER — ALBUTEROL SULFATE (2.5 MG/3ML) 0.083% IN NEBU: 2.5000 mg | INHALATION_SOLUTION | Freq: Four times a day (QID) | RESPIRATORY_TRACT | Status: DC | PRN

## 2018-07-08 MED ORDER — HYDRALAZINE HCL 20 MG/ML IJ SOLN: 5.0000 mg | INTRAMUSCULAR | Status: DC | PRN

## 2018-07-08 MED ORDER — PHENOL 1.4 % MT LIQD
1.0000 | OROMUCOSAL | Status: DC | PRN
  Filled 2018-07-08: qty 177

## 2018-07-08 MED ORDER — DEXAMETHASONE SODIUM PHOSPHATE 10 MG/ML IJ SOLN
INTRAMUSCULAR | Status: AC
  Filled 2018-07-08: qty 1

## 2018-07-08 MED ORDER — NEOSTIGMINE METHYLSULFATE 10 MG/10ML IV SOLN
INTRAVENOUS | Status: AC
  Filled 2018-07-08: qty 1

## 2018-07-08 MED ORDER — SODIUM CHLORIDE 0.9 % IV SOLN: 500.0000 mL | Freq: Once | INTRAVENOUS | Status: DC | PRN

## 2018-07-08 MED ORDER — SODIUM CHLORIDE 0.9 % IV SOLN
INTRAVENOUS | Status: DC
  Administered 2018-07-08 – 2018-07-09 (×3): via INTRAVENOUS

## 2018-07-08 MED ORDER — FENTANYL CITRATE (PF) 250 MCG/5ML IJ SOLN
INTRAMUSCULAR | Status: AC
  Filled 2018-07-08: qty 5

## 2018-07-08 MED ORDER — ONDANSETRON HCL 4 MG/2ML IJ SOLN: 4.0000 mg | Freq: Four times a day (QID) | INTRAMUSCULAR | Status: DC | PRN

## 2018-07-08 MED ORDER — EPINEPHRINE PF 1 MG/ML IJ SOLN
INTRAMUSCULAR | Status: AC
  Filled 2018-07-08: qty 1

## 2018-07-08 MED ORDER — SERTRALINE HCL 50 MG PO TABS
100.0000 mg | ORAL_TABLET | Freq: Every day | ORAL | Status: DC
  Administered 2018-07-08 – 2018-07-10 (×3): 100 mg via ORAL
  Filled 2018-07-08 (×2): qty 2
  Filled 2018-07-08: qty 1

## 2018-07-08 MED ORDER — PROMETHAZINE HCL 25 MG/ML IJ SOLN: 6.2500 mg | INTRAMUSCULAR | Status: DC | PRN

## 2018-07-08 MED ORDER — FAMOTIDINE 20 MG PO TABS: 20.0000 mg | ORAL_TABLET | Freq: Two times a day (BID) | ORAL | Status: DC | PRN

## 2018-07-08 MED ORDER — COENZYME Q10 30 MG PO CAPS: 100.0000 mg | ORAL_CAPSULE | Freq: Every day | ORAL | Status: DC

## 2018-07-08 MED ORDER — ESMOLOL HCL-SODIUM CHLORIDE 2000 MG/100ML IV SOLN
25.0000 ug/kg/min | INTRAVENOUS | Status: DC
  Administered 2018-07-08: 25 ug/kg/min via INTRAVENOUS
  Filled 2018-07-08: qty 100

## 2018-07-08 MED ORDER — PROPOFOL 10 MG/ML IV BOLUS
INTRAVENOUS | Status: DC | PRN
  Administered 2018-07-08: 150 mg via INTRAVENOUS

## 2018-07-08 MED ORDER — FENTANYL CITRATE (PF) 100 MCG/2ML IJ SOLN
INTRAMUSCULAR | Status: DC | PRN
  Administered 2018-07-08: 25 ug via INTRAVENOUS
  Administered 2018-07-08: 75 ug via INTRAVENOUS
  Administered 2018-07-08: 50 ug via INTRAVENOUS
  Administered 2018-07-08: 100 ug via INTRAVENOUS

## 2018-07-08 MED ORDER — HYDROCODONE-ACETAMINOPHEN 5-325 MG PO TABS: 0.5000 | ORAL_TABLET | Freq: Four times a day (QID) | ORAL | Status: DC | PRN

## 2018-07-08 MED ORDER — GLYCOPYRROLATE 0.2 MG/ML IJ SOLN
INTRAMUSCULAR | Status: AC
  Filled 2018-07-08: qty 1

## 2018-07-08 MED ORDER — ALUM & MAG HYDROXIDE-SIMETH 200-200-20 MG/5ML PO SUSP: 15.0000 mL | ORAL | Status: DC | PRN

## 2018-07-08 MED ORDER — MELATONIN 5 MG PO TABS
1.0000 | ORAL_TABLET | Freq: Every day | ORAL | Status: DC
  Administered 2018-07-08 – 2018-07-09 (×2): 5 mg via ORAL
  Filled 2018-07-08 (×3): qty 1

## 2018-07-08 MED ORDER — PHENYLEPHRINE HCL 10 MG/ML IJ SOLN
INTRAMUSCULAR | Status: AC
  Filled 2018-07-08: qty 1

## 2018-07-08 MED ORDER — FAMOTIDINE IN NACL 20-0.9 MG/50ML-% IV SOLN
20.0000 mg | Freq: Two times a day (BID) | INTRAVENOUS | Status: DC
  Administered 2018-07-08 – 2018-07-09 (×3): 20 mg via INTRAVENOUS
  Filled 2018-07-08 (×3): qty 50

## 2018-07-08 MED ORDER — LIDOCAINE HCL (CARDIAC) PF 100 MG/5ML IV SOSY
PREFILLED_SYRINGE | INTRAVENOUS | Status: DC | PRN
  Administered 2018-07-08: 80 mg via INTRAVENOUS

## 2018-07-08 MED ORDER — MIDAZOLAM HCL 2 MG/2ML IJ SOLN
INTRAMUSCULAR | Status: DC | PRN
  Administered 2018-07-08: 1 mg via INTRAVENOUS

## 2018-07-08 MED ORDER — SORBITOL 70 % SOLN
30.0000 mL | Freq: Every day | Status: DC | PRN
  Filled 2018-07-08: qty 30

## 2018-07-08 MED ORDER — ADULT MULTIVITAMIN W/MINERALS CH
1.0000 | ORAL_TABLET | Freq: Every day | ORAL | Status: DC
  Administered 2018-07-08 – 2018-07-10 (×4): 1 via ORAL
  Filled 2018-07-08 (×4): qty 1

## 2018-07-08 MED ORDER — HEPARIN SODIUM (PORCINE) 1000 UNIT/ML IJ SOLN
INTRAMUSCULAR | Status: DC | PRN
  Administered 2018-07-08 (×2): 2000 [IU] via INTRAVENOUS
  Administered 2018-07-08: 5000 [IU] via INTRAVENOUS

## 2018-07-08 SURGICAL SUPPLY — 81 items
APPLIER CLIP 11 MED OPEN (CLIP)
APPLIER CLIP 9.375 SM OPEN (CLIP)
BAG COUNTER SPONGE EZ (MISCELLANEOUS) ×1 IMPLANT
BAG DECANTER FOR FLEXI CONT (MISCELLANEOUS) ×3 IMPLANT
BAG ISOLATATION DRAPE 20X20 ST (DRAPES) IMPLANT
BALLN DORADO 5X60X80 (BALLOONS) ×3
BALLN LUTONIX  018 4X60X130 (BALLOONS) ×2
BALLN LUTONIX 018 4X60X130 (BALLOONS) ×1
BALLOON DORADO 5X60X80 (BALLOONS) IMPLANT
BALLOON LUTONIX 018 4X60X130 (BALLOONS) IMPLANT
BLADE SURG 15 STRL LF DISP TIS (BLADE) ×1 IMPLANT
BLADE SURG 15 STRL SS (BLADE) ×2
BLADE SURG SZ11 CARB STEEL (BLADE) ×3 IMPLANT
BOOT SUTURE AID YELLOW STND (SUTURE) ×1 IMPLANT
BRUSH SCRUB EZ  4% CHG (MISCELLANEOUS) ×2
BRUSH SCRUB EZ 4% CHG (MISCELLANEOUS) ×1 IMPLANT
CANISTER SUCT 1200ML W/VALVE (MISCELLANEOUS) ×3 IMPLANT
CANNULA 5F STIFF (CANNULA) ×2 IMPLANT
CATH BEACON 5 .035 40 KMP TP (CATHETERS) IMPLANT
CATH BEACON 5 .038 40 KMP TP (CATHETERS) ×2
CLIP APPLIE 11 MED OPEN (CLIP) IMPLANT
CLIP APPLIE 9.375 SM OPEN (CLIP) IMPLANT
COUNTER SPONGE BAG EZ (MISCELLANEOUS)
COVER WAND RF STERILE (DRAPES) ×1 IMPLANT
DERMABOND ADVANCED (GAUZE/BANDAGES/DRESSINGS)
DERMABOND ADVANCED .7 DNX12 (GAUZE/BANDAGES/DRESSINGS) ×1 IMPLANT
DEVICE PRESTO INFLATION (MISCELLANEOUS) ×2 IMPLANT
DRAPE C-ARM XRAY 36X54 (DRAPES) ×2 IMPLANT
DRAPE INCISE IOBAN 66X45 STRL (DRAPES) ×3 IMPLANT
DRAPE ISOLATE BAG 20X20 STRL (DRAPES) ×2
DRAPE LAPAROTOMY 100X77 ABD (DRAPES) ×1 IMPLANT
DRAPE UNIVERSAL PACK (DRAPES) ×2 IMPLANT
DURAPREP 26ML APPLICATOR (WOUND CARE) ×5 IMPLANT
ELECT CAUTERY BLADE 6.4 (BLADE) ×3 IMPLANT
ELECT REM PT RETURN 9FT ADLT (ELECTROSURGICAL) ×3
ELECTRODE REM PT RTRN 9FT ADLT (ELECTROSURGICAL) ×1 IMPLANT
GLIDEWIRE ADV .035X180CM (WIRE) ×2 IMPLANT
GLOVE BIO SURGEON STRL SZ7 (GLOVE) ×14 IMPLANT
GLOVE INDICATOR 7.5 STRL GRN (GLOVE) ×9 IMPLANT
GOWN STRL REUS W/ TWL LRG LVL3 (GOWN DISPOSABLE) ×1 IMPLANT
GOWN STRL REUS W/ TWL XL LVL3 (GOWN DISPOSABLE) ×2 IMPLANT
GOWN STRL REUS W/TWL LRG LVL3 (GOWN DISPOSABLE) ×6
GOWN STRL REUS W/TWL XL LVL3 (GOWN DISPOSABLE) ×8
GUIDEWIRE ADV .018X180CM (WIRE) ×2 IMPLANT
HEMOSTAT SURGICEL 2X3 (HEMOSTASIS) ×5 IMPLANT
IV NS 500ML (IV SOLUTION) ×2
IV NS 500ML BAXH (IV SOLUTION) ×1 IMPLANT
KIT PREVENA INCISION MGT 13 (CANNISTER) ×2 IMPLANT
KIT TURNOVER KIT A (KITS) ×3 IMPLANT
LABEL OR SOLS (LABEL) ×3 IMPLANT
LOOP RED MAXI  1X406MM (MISCELLANEOUS) ×4
LOOP VESSEL MAXI 1X406 RED (MISCELLANEOUS) ×2 IMPLANT
LOOP VESSEL MINI 0.8X406 BLUE (MISCELLANEOUS) ×2 IMPLANT
LOOPS BLUE MINI 0.8X406MM (MISCELLANEOUS) ×4
NS IRRIG 500ML POUR BTL (IV SOLUTION) ×3 IMPLANT
PACK ANGIOGRAPHY (CUSTOM PROCEDURE TRAY) ×2 IMPLANT
PACK BASIN MAJOR ARMC (MISCELLANEOUS) ×3 IMPLANT
PACK UNIVERSAL (MISCELLANEOUS) IMPLANT
PATCH CAROTID ECM VASC 1X10 (Prosthesis & Implant Heart) ×4 IMPLANT
SHEATH BRITE TIP 6FRX11 (SHEATH) ×2 IMPLANT
STAPLER SKIN PROX 35W (STAPLE) ×2 IMPLANT
STENT VIABAHN 6X150X120 (Permanent Stent) ×2 IMPLANT
STENT VIABAHN 6X50X120 (Permanent Stent) ×2 IMPLANT
SUT PROLENE 5 0 RB 1 DA (SUTURE) ×2 IMPLANT
SUT PROLENE 6 0 BV (SUTURE) ×36 IMPLANT
SUT PROLENE 7 0 BV 1 (SUTURE) ×10 IMPLANT
SUT SILK 2 0 (SUTURE) ×4
SUT SILK 2-0 18XBRD TIE 12 (SUTURE) ×1 IMPLANT
SUT SILK 3 0 (SUTURE) ×2
SUT SILK 3-0 18XBRD TIE 12 (SUTURE) ×1 IMPLANT
SUT SILK 4 0 (SUTURE) ×2
SUT SILK 4-0 18XBRD TIE 12 (SUTURE) ×1 IMPLANT
SUT VIC AB 2-0 CT1 27 (SUTURE) ×2
SUT VIC AB 2-0 CT1 TAPERPNT 27 (SUTURE) ×2 IMPLANT
SUT VIC AB 3-0 SH 27 (SUTURE)
SUT VIC AB 3-0 SH 27X BRD (SUTURE) ×1 IMPLANT
SUT VICRYL+ 3-0 36IN CT-1 (SUTURE) ×4 IMPLANT
SYR 20CC LL (SYRINGE) ×3 IMPLANT
SYR 5ML LL (SYRINGE) ×3 IMPLANT
TRAY FOLEY MTR SLVR 16FR STAT (SET/KITS/TRAYS/PACK) ×3 IMPLANT
WIRE G 018X200 V18 (WIRE) ×2 IMPLANT

## 2018-07-08 NOTE — Anesthesia Preprocedure Evaluation (Signed)
Anesthesia Evaluation  Patient identified by MRN, date of birth, ID band Patient awake    Reviewed: Allergy & Precautions, H&P , NPO status , Patient's Chart, lab work & pertinent test results  History of Anesthesia Complications (+) PONV and history of anesthetic complications  Airway Mallampati: III  TM Distance: >3 FB     Dental  (+) Missing, Poor Dentition   Pulmonary asthma , neg recent URI, former smoker,           Cardiovascular hypertension, (-) angina+ CAD, + Peripheral Vascular Disease and +CHF (grade 1 DD)  + Valvular Problems/Murmurs (mild AS) AS   Carotid artery stenosis   Neuro/Psych PSYCHIATRIC DISORDERS Anxiety Depression negative neurological ROS     GI/Hepatic Neg liver ROS, hiatal hernia, PUD, GERD  ,  Endo/Other  diabetes  Renal/GU      Musculoskeletal   Abdominal   Peds  Hematology  (+) Blood dyscrasia, anemia ,   Anesthesia Other Findings Past Medical History: No date: Allergy     Comment:  seasonal allergies 2014: Anemia     Comment:  needed 5 units of blood d/t passing out, weak No date: Anxiety 12/24/2017: Aortic stenosis     Comment:  Echo Aug 2018 No date: Arthritis No date: Asthma     Comment:  allergy induced asthma 12/12/2017: Cardiac murmur No date: Cataract No date: Cataract     Comment:  left No date: Complication of anesthesia     Comment:  arrhythmia following colonoscopy No date: Degenerative disc disease, lumbar No date: Degenerative disc disease, lumbar No date: Depression No date: Diabetes mellitus 11/16/2015: Diabetic neuropathy (HCC) No date: Dysrhythmia     Comment:  stenosis of left ventricle No date: GERD (gastroesophageal reflux disease) No date: H/O transfusion     Comment:  patient was given 5 units of blood while at [facility],               blood type O+ No date: History of chicken pox No date: History of hiatal hernia No date: History of measles,  mumps, or rubella No date: HOH (hard of hearing)     Comment:  does not use hearing aides yet No date: Hyperlipidemia No date: Hypertension No date: Irregular heartbeat 12/24/2017: LVH (left ventricular hypertrophy)     Comment:  Echo Aug 2018 No date: Neuropathy 11/16/2015: Opiate use 05/06/2018: Peripheral arterial disease (HCC)     Comment:  At rest, left > right; refer to vasc 11/16/2015: Pes planus of both feet 11/16/2015: Plantar fasciitis of right foot No date: Wheezing  Past Surgical History: No date: ABDOMINAL HYSTERECTOMY No date: banck injections 05/30/2015: CATARACT EXTRACTION W/PHACO; Right     Comment:  Procedure: CATARACT EXTRACTION PHACO AND INTRAOCULAR               LENS PLACEMENT (IOC);  Surgeon: Galen Manila, MD;                Location: [facility] ORS;  Service: Ophthalmology;  Laterality:              Right;  [place] 00:57 AP% 20.9 CDE 11.99 fluid pack lot               #[id number] H 1970: CHOLECYSTECTOMY 2013: COLON SURGERY     Comment:  blocked colon 01/20/2018: COLONOSCOPY WITH PROPOFOL; N/A     Comment:  Procedure: COLONOSCOPY WITH PROPOFOL;  Surgeon: Midge Minium, MD;  Location: [facility] ENDOSCOPY;  Service:               Endoscopy;  Laterality: N/A; No date: EYE SURGERY; Right     Comment:  cataract surgery 2018: FOOT FUSION; Right     Comment:  metal in foot 1970: GALLBLADDER SURGERY 2010: GASTRIC BYPASS     Comment:  lost 178 lbs and regained 40 lbs last few years 2016: internal bleeding     Comment:  ulcer in past 05/27/2018: LOWER EXTREMITY ANGIOGRAPHY; Left     Comment:  Procedure: LOWER EXTREMITY ANGIOGRAPHY;  Surgeon: Annice Needy, MD;  Location: [facility] INVASIVE CV LAB;  Service:               Cardiovascular;  Laterality: Left; No date: MOUTH SURGERY     Comment:  root canals and crowns and extractions No date: OVARY SURGERY 2018: SKIN GRAFT; Right     Comment:  RT foot. foot has been rebuilt.  it is full of metal No date:  TENOTOMY ACHILLES TENDON; Right     Comment:  Percuntaneous. metal in foot No date: TONSILLECTOMY  BMI    Body Mass Index:  39.32 kg/m      Reproductive/Obstetrics negative OB ROS                             Anesthesia Physical Anesthesia Plan  ASA: III  Anesthesia Plan: General ETT   Post-op Pain Management:    Induction:   PONV Risk Score and Plan: Ondansetron, Dexamethasone, Midazolam and Treatment may vary due to age or medical condition  Airway Management Planned:   Additional Equipment:   Intra-op Plan:   Post-operative Plan:   Informed Consent: I have reviewed the patients History and Physical, chart, labs and discussed the procedure including the risks, benefits and alternatives for the proposed anesthesia with the patient or authorized representative who has indicated his/her understanding and acceptance.   Dental Advisory Given  Plan Discussed with: Anesthesiologist, CRNA and Surgeon  Anesthesia Plan Comments:         Anesthesia Quick Evaluation

## 2018-07-08 NOTE — Anesthesia Post-op Follow-up Note (Signed)
Anesthesia QCDR form completed.        

## 2018-07-08 NOTE — Progress Notes (Addendum)
Per MD okay for RN to change diet order, also DC nitroglycerin and dopamine drip.

## 2018-07-08 NOTE — Anesthesia Procedure Notes (Addendum)
Procedure Name: Intubation Date/Time: 07/08/2018 7:48 AM Performed by: Fredderick Phenix, CRNA Pre-anesthesia Checklist: Patient identified, Emergency Drugs available, Suction available, Patient being monitored and Timeout performed Patient Re-evaluated:Patient Re-evaluated prior to induction Oxygen Delivery Method: Circle system utilized Preoxygenation: Pre-oxygenation with 100% oxygen Induction Type: IV induction Ventilation: Mask ventilation without difficulty Laryngoscope Size: 4 and Mac Grade View: Grade I Tube type: Oral Tube size: 7.0 mm Number of attempts: 1 Airway Equipment and Method: Stylet Placement Confirmation: ETT inserted through vocal cords under direct vision,  positive ETCO2 and breath sounds checked- equal and bilateral Secured at: 22 cm Tube secured with: Tape

## 2018-07-08 NOTE — H&P (Signed)
[facility] History & Physical Update  The patient was interviewed and re-examined.  The patient's previous History and Physical has been reviewed and is unchanged.  There is no change in the plan of care. We plan to proceed with the scheduled procedure.  Festus Barren, MD  07/08/2018, 7:26 AM

## 2018-07-08 NOTE — Op Note (Addendum)
OPERATIVE NOTE   PROCEDURE: 1. Left common femoral and profunda femoris endarterectomy with Cormatrix patch angioplasty 2. Left superficial femoral endarterectomy through a separate arteriotomy with a second core matrix patch angioplasty 3. Open angioplasty and stent placement left SFA and popliteal 4. Open angioplasty of the mid popliteal to 4 mm with a Lutonix drug-eluting balloon. 5. Left lower extremity angiography  PRE-OPERATIVE DIAGNOSIS: Atherosclerotic occlusive disease left lower extremity with lifestyle limiting claudication and severe rest pain symptoms; hypertension; diabetes mellitus  POST-OPERATIVE DIAGNOSIS: Same  CO-SURGEON: Katha Cabal, MD and Algernon Huxley, M.D. for the surgical endarterectomies.  Dr. dew his primary surgeon for the angioplasty and stent placement and I was a first assistant  ASSISTANT(S): None  ANESTHESIA: general  ESTIMATED BLOOD LOSS: 300 cc  FINDING(S): 1. Profound calcific plaque noted of the left left common femoral extending past the initial bifurcation of the profunda femoris arteries as well as down the extensive length of the SFA  SPECIMEN(S):  Calcific plaque from the common femoral, superficial femoral and the profunda femoris artery  INDICATIONS:   Andrea Holden 73 y.o. y.o.female who presents with complaints of lifestyle limiting claudication and pain continuously in the left lower extremity. The patient has documented severe atherosclerotic occlusive disease and has undergone minimally invasive treatments in the past. However, at this point his primary area of stricture stenosis resides in the common femoral and origins of the superficial femoral and profunda femoris extending into these arteries and therefore this is not amenable to intervention. The patient is therefore undergoing open endarterectomy. The risks and benefits of surgery have been reviewed with the patient, all  questions have answered; alternative therapies have been reviewed as well and the patient has agreed to proceed with surgical open repair.  DESCRIPTION: After obtaining full informed written consent, the patient was brought back to the operating room and placed supine upon the operating table.  The patient received IV antibiotics prior to induction.  After obtaining adequate anesthesia, the patient was prepped and draped in the standard fashion for left femoral exposure.  Co-surgeons are required because this is a complicated procedure with work being performed simultaneously from both the patient's right left sides.  This also expedites the procedure making a shorter operative time reducing complications and improving patient safety.  Attention was turned to the left groin with Dr. Lucky Cowboy working on the patient's left and myself working on the right of the patient.  Vertical  Incision was made over the left common femoral artery and dissection carried down to the common femoral artery with electrocautery.  I dissected out the common femoral artery from the distal external iliac artery (identified by the superficial circumflex vessels) down to the femoral bifurcation.  On initial inspection, the common femoral artery was: densely calcified and there was no palpable pulse noted.    Subsequently the dissection was continued to include all circumflex branches and the profunda femoral artery and superficial femoral artery. The superficial femoral artery was dissected circumferentially for a distance of approximately 3-4 cm and the profunda femoris was dissected circumferentially out to the fourth order branches individual vessel loops were placed around each branch.  Control of all branches was obtained with vessel loops.  A softer area in the distal external iliac artery amendable to clamping was identified.    The patient was given 5000 units of Heparin intravenously (this was the initial bolus.  Throughout the  case she received 2 more boluses of 2000 units of heparin  each), which was a therapeutic bolus.   After waiting 3 minutes, the distal external iliac artery was clamped and all of the vessel loops were placed under tension.  Arteriotomy was made in the common femoral artery with a 11-blade and extended it with a Potts scissor proximally and distally extending the distal end down the SFA for approximately 3 cm.   Endarterectomy was then performed under direct visualization using a freer elevator and a right angle from the mid common femoral extending up both proximally and distally. Proximally the endarterectomy was brought up to the level of the clamp where a clean edge was obtained. Distally the endarterectomy was carried down to a soft spot in the profunda femoris where a feathered edge would was obtained.  7-0 Prolene interrupted tacking sutures were placed to secure the leading edge of the plaque in the profunda femoris.  The plaque in the SFA was far too extensive and we were unable to treat the SFA with an eversion technique.  The femoral skin incision was extended and the soft tissues were dissected down to the SFA exposing approximately 10 to 12 cm of the proximal SFA.  A completely separate and distinct arteriotomy was then made in the SFA approximately 1 cm distal to the origin.  Arteriotomy was made with an 11 blade and extended with Potts scissors.  Extending the endarterectomy approximately 6-7 cm distally allowed a featheredge of plaque to be created.  The arteriotomy was performed under direct visualization with a freer elevator.  7-0 Prolene sutures were used to tack the distal intimal edge.   At this point, a corematrix patch was fashioned for the geometry of the arteriotomy.  The patch was sewn to the artery with 2 running stitches of 6-0 Prolene, running from each end.  Prior to completing the patch angioplasty, the profunda femoral artery was flushed as was the superficial femoral artery.  The system was then forward flushed. The endarterectomy site was then irrigated copiously with heparinized saline. The patch angioplasty was completed in the usual fashion with Dr. Lucky Cowboy sewing the lateral side of the patch myself sewing the medial side of the patch.  Flow was then reestablished first to the profunda femoris and then the superficial femoral artery. Any gaps or bleeding sites in the suture line were easily controlled with a 6-0 Prolene suture. Doppler is then delivered onto the field and the SFA as well as the profunda femoris arteries were interrogated and found to have triphasic Doppler signals.  At this point a 6 French sheath was introduced by Dr. Lucky Cowboy and on table arteriography was performed.  First assistant was required to help with wire manipulation and sheath control as well as advancing and deploying the stent.  After localizing the multiple lesions within the SFA and popliteal were identified for 5 areas that were greater than 80%.  V 18 wire was advanced through the lesions and into the tibial vessels.  A 6 mm x 15 cm via bond stent was then deployed followed by a 6 mm x 5 cm length via bond was deployed these were postdilated with a 5 mm Dorado balloon multiple inflations were performed to 14 to 20 atm.  In the mid popliteal a 4 mm Lutonix drug-eluting balloon was used to treat a focal mid popliteal stenosis of 70%.  Follow-up imaging through the sheath now demonstrated less than 10% residual stenosis with filling of the tibial vessels both the anterior tibial as well as the posterior tibial.  Anterior  tibial dominant vessel to the foot.  There is preservation of the distal runoff.  Now that the SFA and popliteal have been adequately treated we return to the SFA arteriotomy.  Core matrix patch was delivered onto the field it was then trimmed to the appropriate bevel and it was applied to the SFA arteriotomy using running 6-0 Prolene in the same fashion as described above, with Dr. Lucky Cowboy  sewing the lateral side of the patch myself sewing the medial side of the patch.  Flushing maneuvers were performed and flow was reestablished to the foot.  Any bleeding points along the suture line were controlled with interrupted 6-0 Prolene sutures.  Once hemostasis been obtained the wound was irrigated copiously with heparinized saline.  The left groin was then irrigated copiously with sterile saline and subsequently Evicel and Surgicel were placed in the wound. The incision was repaired with a double layer of 2-0 Vicryl, a double layer of 3-0 Vicryl, and a layer of 4-0 Monocryl in a subcuticular fashion.  The skin was cleaned, dried, and reinforced with Dermabond.  COMPLICATIONS: None  CONDITION: Carlynn Purl, M.D. [facility] Office: [phone]  07/08/2018, 12:38 PM

## 2018-07-08 NOTE — Progress Notes (Signed)
PHARMACIST - PHYSICIAN ORDER COMMUNICATION  CONCERNING: P&T Medication Policy on Herbal Medications  DESCRIPTION:  This patient's order for:  Co-enzyme Q10 has been noted.  This product(s) is classified as an "herbal" or natural product. Due to a lack of definitive safety studies or FDA approval, nonstandard manufacturing practices, plus the potential risk of unknown drug-drug interactions while on inpatient medications, the Pharmacy and Therapeutics Committee does not permit the use of "herbal" or natural products of this type within [place].   ACTION TAKEN: The pharmacy department is unable to verify this order at this time and your patient has been informed of this safety policy. Please reevaluate patient's clinical condition at discharge and address if the herbal or natural product(s) should be resumed at that time.   

## 2018-07-08 NOTE — Anesthesia Postprocedure Evaluation (Signed)
Anesthesia Post Note  Patient: Andrea Holden  Procedure(s) Performed: ENDARTERECTOMY FEMORAL (Left ) LOWER EXTREMITY ANGIOGRAM (Left )  Patient location during evaluation: PACU Anesthesia Type: General Level of consciousness: awake and alert Pain management: pain level controlled Vital Signs Assessment: post-procedure vital signs reviewed and stable Respiratory status: spontaneous breathing, nonlabored ventilation and respiratory function stable Cardiovascular status: blood pressure returned to baseline and stable Postop Assessment: no apparent nausea or vomiting Anesthetic complications: no     Last Vitals:  Vitals:   07/08/18 1315 07/08/18 1325  BP: (!) 128/50 (!) 128/49  Pulse: 66 70  Resp: 12 10  Temp:    SpO2: 98% 96%    Last Pain:  Vitals:   07/08/18 1325  TempSrc:   PainSc: Asleep                 Jovita Gamma

## 2018-07-08 NOTE — Op Note (Signed)
OPERATIVE NOTE   PROCEDURE: 1. Left common femoral and profunda femoris endarterectomies and patch angioplasty 2.   Separate left superficial femoral artery endarterectomy and patch angioplasty 3.   Left lower extremity angiogram 4.   Viabahn stent placement x2 with a 6 mm diameter by 15 cm length and 6 mm diameter by 5 cm length stent for mid and distal left SFA high-grade stenosis 5.   Percutaneous transluminal angioplasty of the left popliteal artery with 4 mm diameter by 6 cm length Lutonix drug-coated balloon for 70% left popliteal artery stenosis    PRE-OPERATIVE DIAGNOSIS: 1.Atherosclerotic occlusive disease left lower extremities with rest pain   POST-OPERATIVE DIAGNOSIS: Same  SURGEON: Leotis Pain, MD  CO-surgeon: Hortencia Pilar, MD  ANESTHESIA: general  ESTIMATED BLOOD LOSS: 250 cc  FINDING(S): 1. significant plaque in left common femoral, profunda femoris, and superficial femoral arteries 2.  Multiple areas of 70% or greater stenosis within the left SFA and popliteal arteries.  Anterior tibial artery was the best runoff to the foot.  SPECIMEN(S): Left common femoral, profunda femoris, and superficial femoral artery plaque.  INDICATIONS:  Patient presents with rest pain on the left foot and disabling claudication of the left leg.  Left femoral endarterectomy is planned to try to improve perfusion. Angiogram also showed infrainguinal disease in the SFA and popliteal arteries, and concomitant treatment of that was planned as well.  The risks and benefits as well as alternative therapies including intervention were reviewed in detail all questions were answered the patient agrees to proceed with surgery.  DESCRIPTION: After obtaining full informed written consent, the patient was brought back to the operating room and placed supine upon the operating table. The patient received IV antibiotics prior to induction. After obtaining adequate anesthesia, the patient was  prepped and draped in the standard fashion appropriate time out is called.   Vertical incision was created overlying the left femoral arteries. The common femoral artery proximally, and superficial femoral artery, and primary profunda femoris artery branches were encircled with vessel loops and prepared for control. The left femoral arteries were found to have significant plaque from the common femoral artery into the profunda and superficial femoral arteries.  Given her body habitus and somewhat high bifurcation, the dissection was quite tedious and difficult.   5000 units of heparin was given and allowed circulate for 5 minutes.  Distal dose of heparin were given later in the procedure as needed to continue anticoagulation  Attention is then turned to the left femoral artery. An arteriotomy is made with 11 blade and extended with Potts scissors in the common femoral artery and carried down onto the first 1-2 cm of the profunda femoris artery. An endarterectomy was then performed. The [facility] was used to create a plane. The proximal endpoint was cut flush with tenotomy scissors. This was in the proximal common femoral artery.  Attempts at an eversion endarterectomy of the left superficial femoral artery yielded some plaque but a large bulky plaque continued several centimeters beyond the initial portion of the left SFA that would not be amenable to typical eversion endarterectomy.  The distal endpoint of the profunda femoris artery endarterectomy was created with gentle traction and the distal endpoint was tacked down with two 7-0 Prolene sutures. The Cormatrix patcth is then selected and prepared for a patch angioplasty.  It is cut and beveled and started at the proximal endpoint with a 6-0 Prolene suture.  Approximately one half of the suture line is run medially and laterally  and the distal end point was cut and bevelled to match the arteriotomy.  A second 6-0 Prolene was started at the  distal end point and run to the mid portion to complete the arteriotomy.  The vessel was flushed prior to release of control and completion of the anastomosis.  At this point, flow was established first to the profunda femoris artery and then to the superficial femoral artery. Easily palpable pulses are noted well beyond the anastomosis in the profunda femoris artery. I then used a micropuncture needle and wire and put a sheath into the proximal left SFA through the patch.  There was a bulky plaque that extended about 5 to 6 cm downstream in the SFA that would not be easily amenable to percutaneous therapy.  Below this, was multilevel stenotic disease that would be more amenable to endovascular therapy.  We then extended our dissection down the superficial femoral artery below this proximal bulky plaque.  A vessel loop was placed about 8 cm beyond its origin for distal control and a clamp was placed at the origin of the SFA for proximal control.  An arteriotomy was created on the anterior wall of the left superficial femoral artery and extended with Potts scissors.  An endarterectomy was then performed with the elevator in the typical fashion up to the proximal endpoint of the previous endarterectomy at the SFA origin with a clean proximal endpoint.  It was taken down below the densest of the plaque to an area where the disease was less severe and the plaque was transected in the superficial femoral artery at this location.  It was tacked down with two 7-0 Prolene sutures.  A sheath was then placed into the SFA through the arteriotomy to address the multi-segment disease in the SFA and popliteal arteries.  A 0.018 wire was taken past the areas of stenosis without difficulty and parked in the tibial vessels.  A 6 mm diameter by 15 cm length Viabahn stent was then deployed from the sheath insertion site down to just above Hunter's canal.  There was a bulky plaque just below the stent and a second 6 mm diameter stent  was required to cover this.  This was 5 cm in length these were then postdilated with 5 mm diameter high-pressure angioplasty balloons with less than 10% residual stenosis.  In the mid popliteal artery right at the knee, there was a 70% stenosis.  To avoid stent placement in this location a 4 mm diameter by 6 cm length Lutonix drug-coated angioplasty balloon was inflated to 10 atm for 1 minute.  Following this there was about a 20% residual stenosis.  The sheath was then removed.  A core matrix patch was then brought onto the field beveled to match the arteriotomy in the left superficial femoral artery.  A 6-0 Prolene was started at the proximal endpoint of the arteriotomy medially and laterally.  A second 6-0 Prolene was started at the distal endpoint and the suture line was completed after flushing maneuvers were performed.  Patch sutures were used for hemostasis and there was an excellent pulse beyond the arteriotomy with release of control.  Surgicel and Evicel topical hemostatic agents were placed in the femoral incision and hemostasis was complete. The femoral incision was then closed in a layered fashion with 2 layers of 2-0 Vicryl, 2 layers of 3-0 Vicryl, and staples for the skin closure.  Given her large size need extensive dissection, a Praveena VAC was then placed over the wound and  connected to suction.  The patient was then awakened from anesthesia and taken to the recovery room in stable condition having tolerated the procedure well.  COMPLICATIONS: None  CONDITION: Stable     Leotis Pain 07/08/2018 12:20 PM   This note was created with Dragon Medical transcription system. Any errors in dictation are purely unintentional.

## 2018-07-08 NOTE — Transfer of Care (Signed)
Immediate Anesthesia Transfer of Care Note  Patient: Andrea Holden  Procedure(s) Performed: ENDARTERECTOMY FEMORAL (Left ) LOWER EXTREMITY ANGIOGRAM (Left )  Patient Location: PACU  Anesthesia Type:General  Level of Consciousness: drowsy  Airway & Oxygen Therapy: Patient Spontanous Breathing and Patient connected to face mask oxygen  Post-op Assessment: Report given to RN  Post vital signs: Reviewed and stable  Last Vitals:  Vitals Value Taken Time  BP 153/63 07/08/2018 12:06 PM  Temp    Pulse 89 07/08/2018 12:09 PM  Resp 13 07/08/2018 12:09 PM  SpO2 97 % 07/08/2018 12:09 PM  Vitals shown include unvalidated device data.  Last Pain:  Vitals:   07/08/18 1206  TempSrc:   PainSc: (P) Asleep         Complications: No apparent anesthesia complications

## 2018-07-09 ENCOUNTER — Encounter: Payer: Self-pay | Admitting: Vascular Surgery

## 2018-07-09 LAB — CBC
HCT: 31.7 % — ABNORMAL LOW (ref 36.0–46.0)
Hemoglobin: 10.1 g/dL — ABNORMAL LOW (ref 12.0–15.0)
MCH: 27.7 pg (ref 26.0–34.0)
MCHC: 31.9 g/dL (ref 30.0–36.0)
MCV: 86.8 fL (ref 80.0–100.0)
Platelets: 156 10*3/uL (ref 150–400)
RBC: 3.65 MIL/uL — ABNORMAL LOW (ref 3.87–5.11)
RDW: 13.4 % (ref 11.5–15.5)
WBC: 11.2 10*3/uL — ABNORMAL HIGH (ref 4.0–10.5)
nRBC: 0 % (ref 0.0–0.2)

## 2018-07-09 LAB — BASIC METABOLIC PANEL
Anion gap: 5 (ref 5–15)
BUN: 23 mg/dL (ref 8–23)
CO2: 23 mmol/L (ref 22–32)
Calcium: 8.1 mg/dL — ABNORMAL LOW (ref 8.9–10.3)
Chloride: 105 mmol/L (ref 98–111)
Creatinine, Ser: 0.86 mg/dL (ref 0.44–1.00)
GFR calc Af Amer: >60 mL/min (ref >60)
GFR calc non Af Amer: >60 mL/min (ref >60)
Glucose, Bld: 164 mg/dL — ABNORMAL HIGH (ref 70–99)
Potassium: 4.8 mmol/L (ref 3.5–5.1)
Sodium: 133 mmol/L — ABNORMAL LOW (ref 135–145)

## 2018-07-09 LAB — ABO/RH: ABO/RH(D): O POS

## 2018-07-09 LAB — SURGICAL PATHOLOGY

## 2018-07-09 MED ORDER — SODIUM CHLORIDE 1 G PO TABS
2.0000 g | ORAL_TABLET | Freq: Once | ORAL | Status: AC
  Administered 2018-07-09: 2 g via ORAL
  Filled 2018-07-09: qty 2

## 2018-07-09 MED ORDER — CLOPIDOGREL BISULFATE 75 MG PO TABS
75.0000 mg | ORAL_TABLET | Freq: Every day | ORAL | Status: DC
  Administered 2018-07-10: 75 mg via ORAL
  Filled 2018-07-09 (×2): qty 1

## 2018-07-09 NOTE — Progress Notes (Signed)
OT Cancellation Note  Patient Details Name: Andrea Holden MRN: [id number] DOB: 04-22-1946   Cancelled Treatment:    Reason Eval/Treat Not Completed: Other (comment). Consult received, chart reviewed. Pt with PT for assessment. Will re-attempt at later time as pt is available and medically appropriate.   Richrd PrimeJamie Stiller, MPH, MS, OTR/L ascom [phone] 07/09/18, 10:13 AM

## 2018-07-09 NOTE — Progress Notes (Signed)
[facility] Daily Progress Note   Subjective: 1 Day Post-Op: Left common femoral and profunda femoris endarterectomies and patch angioplasty, Separate left superficial femoral artery endarterectomy and patch angioplasty, Left lower extremity angiogram, Viabahn stent placement x2 with a 6 mm diameter by 15 cm length and 6 mm diameter by 5 cm length stent for mid and distal left SFA high-grade stenosis, Percutaneous transluminal angioplasty of the left popliteal artery with 4 mm diameter by 6 cm length Lutonix drug-coated balloon for 70% left popliteal artery stenosis  Patient with some left lower extremity pain this AM. No issues overnight. Working with OT and PT.   Objective: Vitals:   07/08/18 1812 07/08/18 1951 07/09/18 0451 07/09/18 0901  BP: (!) 138/55 (!) 136/54 (!) 128/56 (!) 129/49  Pulse: (!) 58 78 61   Resp:  (!) 24 20   Temp: 98.1 F (36.7 C) 98 F (36.7 C) 97.7 F (36.5 C)   TempSrc:  Oral Oral   SpO2: 97% 99% 98%   Weight:      Height:        Intake/Output Summary (Last 24 hours) at 07/09/2018 1213 Last data filed at 07/09/2018 1100 Gross per 24 hour  Intake 785.86 ml  Output 2175 ml  Net -1389.14 ml   Physical Exam: A&Ox3, NAD CV: RRR Pulmonary: CTA Bilaterally Abdomen: Soft, Nontender, Nondistended Groin: VAC in place.  Vascular:  Left Lower Extremity: Thigh soft, calf soft, extremity is warm distally to toes   Laboratory: CBC    Component Value Date/Time   WBC 11.2 (H) 07/09/2018 0104   HGB 10.1 (L) 07/09/2018 0104   HGB 12.9 11/21/2015 1416   HCT 31.7 (L) 07/09/2018 0104   HCT 39.7 11/21/2015 1416   PLT 156 07/09/2018 0104   PLT 226 11/21/2015 1416   BMET    Component Value Date/Time   NA 133 (L) 07/09/2018 0104   NA 133 (L) 03/07/2016 1317   NA 135 10/12/2014 0516   K 4.8 07/09/2018 0104   K 4.3 10/12/2014 0516   CL 105 07/09/2018 0104   CL 109 10/12/2014 0516   CO2 23 07/09/2018 0104   CO2 24 10/12/2014 0516   GLUCOSE  164 (H) 07/09/2018 0104   GLUCOSE 146 (H) 10/12/2014 0516   BUN 23 07/09/2018 0104   BUN 18 03/07/2016 1317   BUN 39 (H) 10/12/2014 0516   CREATININE 0.86 07/09/2018 0104   CREATININE 0.85 04/23/2018 1204   CALCIUM 8.1 (L) 07/09/2018 0104   CALCIUM 7.6 (L) 10/12/2014 0516   GFRNONAA >60 07/09/2018 0104   GFRNONAA 69 04/23/2018 1204   GFRAA >60 07/09/2018 0104   GFRAA 80 04/23/2018 1204   Assessment/Planning: 73 year old female with severe PAD s/p Left common femoral and profunda femoris endarterectomies and patch angioplasty, Separate left superficial femoral artery endarterectomy and patch angioplasty, Left lower extremity angiogram, Viabahn stent placement x2 with a 6 mm diameter by 15 cm length and 6 mm diameter by 5 cm length stent for mid and distal left SFA high-grade stenosis, Percutaneous transluminal angioplasty of the left popliteal artery with 4 mm diameter by 6 cm length Lutonix drug-coated balloon for 70% left popliteal artery stenosis - POD #1 1) Replete sodium 2) 2 gram drop in hbg however patient is asymptomatic at this tim, repeat AM labs 3) Continue to ambulate with PT and nursing staff 4) Possible d/c home tomorrow  Discussed with Dr. Wallis Mart Fillmore Bynum PA-C 07/09/2018 12:13 PM

## 2018-07-09 NOTE — Evaluation (Signed)
Physical Therapy Evaluation Patient Details Name: Andrea Holden MRN: [id number] DOB: 1946/03/08 Today's Date: 07/09/2018   History of Present Illness  Pt is a 73 y/o F s/p L femoral endarterectomy, angiogram.  Pt's PMH includes neuropathy irregular heartbeat, HOH, lumbar DDD.     Clinical Impression  Pt admitted with above diagnosis. Pt currently with functional limitations due to the deficits listed below (see PT Problem List). Pt was ambulating with SPC and was ind with all ADLs PTA.  She currently requires use of a RW due to pain in L groin. She remains steady ambulating with RW with verbal cues for proper RW use and posture.  Cues for safety with transfers. Pt will benefit from skilled PT to increase their independence and safety with mobility to allow discharge to the venue listed below.      Follow Up Recommendations Home health PT    Equipment Recommendations  Rolling walker with 5" wheels    Recommendations for Other Services OT consult     Precautions / Restrictions Precautions Precautions: Fall Restrictions Weight Bearing Restrictions: No      Mobility  Bed Mobility Overal bed mobility: Modified Independent Bed Mobility: Supine to Sit     Supine to sit: Modified independent (Device/Increase time)     General bed mobility comments: Increased effort and time but no physical assist or cues needed  Transfers Overall transfer level: Needs assistance Equipment used: Rolling walker (2 wheeled) Transfers: Sit to/from Stand Sit to Stand: Min guard         General transfer comment: Min guard for safety with cues for proper hand placement.  Pt stood from bed x1, from chair x1 and from [facility] x1.    Ambulation/Gait Ambulation/Gait assistance: Supervision Gait Distance (Feet): 160 Feet Assistive device: Rolling walker (2 wheeled) Gait Pattern/deviations: Step-through pattern;Decreased step length - right;Decreased step length - left;Trunk flexed Gait velocity:  decreased   General Gait Details: Trunk flexed, cues to ambulate within BOS of RW.  Dec gait speed but pt remains steady throughout.   Stairs            Wheelchair Mobility    Modified Rankin (Stroke Patients Only)       Balance Overall balance assessment: Needs assistance Sitting-balance support: No upper extremity supported;Feet supported Sitting balance-Leahy Scale: Good     Standing balance support: No upper extremity supported;During functional activity Standing balance-Leahy Scale: Fair Standing balance comment: Pt able to stand statically but utilizes RW to ambulate                             Pertinent Vitals/Pain Pain Assessment: Faces Faces Pain Scale: Hurts even more Pain Location: L groin  Pain Descriptors / Indicators: Discomfort;Guarding;Aching Pain Intervention(s): Limited activity within patient's tolerance;Monitored during session;Repositioned;Utilized relaxation techniques    Home Living Family/patient expects to be discharged to:: Private residence Living Arrangements: Alone Available Help at Discharge: Friend(s);Available PRN/intermittently Type of Home: House Home Access: Stairs to enter Entrance Stairs-Rails: Left;Right;Can reach both Entrance Stairs-Number of Steps: 3 Home Layout: One level Home Equipment: Walker - 4 wheels;Cane - single point;Bedside commode;Tub bench;Grab bars - toilet;Grab bars - tub/shower      Prior Function Level of Independence: Independent with assistive device(s)         Comments: Ambulates with SPC.  Ind with all ADLs and still driving.      Hand Dominance        Extremity/Trunk Assessment  Upper Extremity Assessment Upper Extremity Assessment: Defer to OT evaluation    Lower Extremity Assessment Lower Extremity Assessment: (BLE strength grossly 4/5, wound vac L groin)       Communication   Communication: No difficulties  Cognition Arousal/Alertness: Awake/alert Behavior During  Therapy: WFL for tasks assessed/performed Overall Cognitive Status: Within Functional Limits for tasks assessed                                 General Comments: Pt very talkative and distracted and requires cues to remain focused on functional task      General Comments      Exercises General Exercises - Lower Extremity Ankle Circles/Pumps: AROM;Both;10 reps;Supine   Assessment/Plan    PT Assessment Patient needs continued PT services  PT Problem List Decreased strength;Decreased balance;Decreased knowledge of use of DME;Decreased safety awareness;Pain       PT Treatment Interventions DME instruction;Gait training;Stair training;Functional mobility training;Therapeutic activities;Therapeutic exercise;Balance training;Neuromuscular re-education;Patient/family education;Modalities    PT Goals (Current goals can be found in the Care Plan section)  Acute Rehab PT Goals Patient Stated Goal: to go home at d/c PT Goal Formulation: With patient Time For Goal Achievement: 07/23/18 Potential to Achieve Goals: Good    Frequency Min 2X/week   Barriers to discharge        Co-evaluation               AM-PAC PT "6 Clicks" Mobility  Outcome Measure Help needed turning from your back to your side while in a flat bed without using bedrails?: None Help needed moving from lying on your back to sitting on the side of a flat bed without using bedrails?: A Little Help needed moving to and from a bed to a chair (including a wheelchair)?: A Little Help needed standing up from a chair using your arms (e.g., wheelchair or bedside chair)?: A Little Help needed to walk in hospital room?: A Little Help needed climbing 3-5 steps with a railing? : A Little 6 Click Score: 19    End of Session Equipment Utilized During Treatment: Gait belt Activity Tolerance: Patient tolerated treatment well Patient left: in chair;with call bell/phone within reach;with chair alarm set Nurse  Communication: Mobility status PT Visit Diagnosis: Muscle weakness (generalized) (M62.81);Other abnormalities of gait and mobility (R26.89);Unsteadiness on feet (R26.81)    Time: [id number] PT Time Calculation (min) (ACUTE ONLY): 40 min   Charges:   PT Evaluation $PT Eval Low Complexity: 1 Low PT Treatments $Gait Training: 8-22 mins $Therapeutic Activity: 8-22 mins        Encarnacion ChuAshley Andreia Gandolfi PT, DPT 07/09/2018, 10:40 AM

## 2018-07-09 NOTE — Progress Notes (Signed)
OT Cancellation Note  Patient Details Name: Andrea Holden MRN: [id number] DOB: 05-08-1946   Cancelled Treatment:    Reason Eval/Treat Not Completed: Patient declined, no reason specified. Upon 2nd attempt, pt politely declining 2/2 having a visitor present whom she wanted to speak with without therapist present. Will re-attempt at later date/time as pt is agreeable, medically appropriate, and as schedule permits.  Richrd Prime, MPH, MS, OTR/L ascom [phone] 07/09/18, 10:28 AM

## 2018-07-09 NOTE — Evaluation (Signed)
Occupational Therapy Evaluation Patient Details Name: Andrea MangleDianna J Buttler MRN: [id number] DOB: Nov 11, 1945 Today's Date: 07/09/2018    History of Present Illness Pt is a 73 y/o F s/p L femoral endarterectomy, angiogram.  Pt's PMH includes neuropathy irregular heartbeat, HOH, lumbar DDD.    Clinical Impression   Pt seen for OT evaluation this date. Prior to hospital admission, pt was modified indep with mobility using SPC, ADL using AE/modified techniques, and indep with IADL including driving. Pt endorses 1 fall approx 1 week ago where she "wrenched" her RUE. Hx of RUE humeral fx repair with a "9 inch piece of metal" in her arm. Pt's arm length notably shorter than R.  Pt lives alone and has friends who can assist her PRN.  Currently pt demonstrates impairments in strength, balance, safety awareness, and pain requiring CGA assist for functional mobility with cues for RW use and safety; MIN A for LB ADL. PT easily distractible requiring cues to attend to task. Pt instructed in additional AE for LB dressing to improve independence and safety. Pt also educated in falls prevention strategies. Pt verbalizes understanding, but would benefit from skilled OT to address noted impairments and functional limitations (see below for any additional details) in order to maximize safety and independence while minimizing falls risk and caregiver burden.  Upon hospital discharge[facility]     Follow Up Recommendations  Home health OT    Equipment Recommendations  None recommended by OT    Recommendations for Other Services       Precautions / Restrictions Precautions Precautions: Fall Restrictions Weight Bearing Restrictions: No      Mobility Bed Mobility Overal bed mobility: Needs Assistance Bed Mobility: Supine to Sit;Sit to Supine     Supine to sit: HOB elevated;Supervision Sit to supine: Min guard   General bed mobility comments: CGA for BLE during sit>sup; +time  to perform both  Transfers Overall transfer level: Needs assistance Equipment used: Rolling walker (2 wheeled) Transfers: Sit to/from Stand Sit to Stand: From elevated surface;Min guard         General transfer comment: increased height of bed to mimic home set up    Balance Overall balance assessment: Needs assistance Sitting-balance support: No upper extremity supported;Feet supported Sitting balance-Leahy Scale: Fair Sitting balance - Comments: pt leaning slightly to R side to offweight painful L side   Standing balance support: Bilateral upper extremity supported;During functional activity Standing balance-Leahy Scale: Poor Standing balance comment: required BUE on RW for maximal stability during toileting                           ADL either performed or assessed with clinical judgement   ADL Overall ADL's : Needs assistance/impaired Eating/Feeding: Sitting;Independent   Grooming: Sitting;Independent   Upper Body Bathing: Sitting;Set up Upper Body Bathing Details (indicate cue type and reason): PRN MIN A Lower Body Bathing: Sit to/from stand;Minimal assistance   Upper Body Dressing : Sitting;Set up   Lower Body Dressing: Sit to/from stand;Minimal assistance   Toilet Transfer: Min guard;BSC;Ambulation;RW;Cueing for safety   Toileting- Clothing Manipulation and Hygiene: Maximal assistance Toileting - Clothing Manipulation Details (indicate cue type and reason): max for hygiene              Vision Patient Visual Report: No change from baseline       Perception     Praxis      Pertinent Vitals/Pain Pain Assessment: 0-10 Pain Score:  7  Faces Pain Scale: Hurts even more Pain Location: L groin/thigh/knee Pain Descriptors / Indicators: Aching Pain Intervention(s): Limited activity within patient's tolerance;Monitored during session;RN gave pain meds during session;Patient requesting pain meds-RN notified;Repositioned     Hand Dominance  Right   Extremity/Trunk Assessment Upper Extremity Assessment Upper Extremity Assessment: RUE deficits/detail RUE Deficits / Details: hx R humural injury with surgical repair which has shortened pt's humerus/UE; grip WFL, strength grossly equal to L   Lower Extremity Assessment Lower Extremity Assessment: (BLE strength grossly 4/5, wound vac L groin)       Communication Communication Communication: No difficulties   Cognition Arousal/Alertness: Awake/alert Behavior During Therapy: WFL for tasks assessed/performed Overall Cognitive Status: Within Functional Limits for tasks assessed                                 General Comments: Pt very talkative and distracted and requires cues to remain focused on functional task   General Comments       Exercises Other Exercises: pt educated in AE/DME to improve pt's independence and safety with compression stockings, LB dressing Other Exercises: pt educated in falls prevention strategies    Shoulder Instructions      Home Living Family/patient expects to be discharged to:: Private residence Living Arrangements: Alone Available Help at Discharge: Friend(s);Available PRN/intermittently Type of Home: House Home Access: Stairs to enter [employer] of Steps: 3 Entrance Stairs-Rails: Left;Right;Can reach both Home Layout: One level     Bathroom Shower/Tub: Engineer, production Accessibility: Yes(with RW, not rollator)   Home Equipment: Environmental consultant - 4 wheels;Cane - single point;Bedside commode;Tub bench;Grab bars - toilet;Grab bars - tub/shower;Toilet riser;Adaptive equipment Adaptive Equipment: Reacher;Long-handled shoe horn Additional Comments: reacher is broken per pt, BSC is being loaned from friend      Prior Functioning/Environment Level of Independence: Independent with assistive device(s)        Comments: Ambulates with SPC.  Ind with IADL, mod indep with ADLs and still driving. Uses elastic  shoe laces, uses LH shoe horn to don socks, has to wear compression stockings and has a difficult time putting them on        OT Problem List: Decreased strength;Decreased knowledge of use of DME or AE;Pain;Obesity;Impaired balance (sitting and/or standing);Decreased safety awareness      OT Treatment/Interventions: Self-care/ADL training;Balance training;Therapeutic exercise;Therapeutic activities;DME and/or AE instruction;Patient/family education    OT Goals(Current goals can be found in the care plan section) Acute Rehab OT Goals Patient Stated Goal: to go home at d/c OT Goal Formulation: With patient Time For Goal Achievement: 07/23/18 Potential to Achieve Goals: Good ADL Goals Pt Will Perform Lower Body Dressing: with modified independence;with adaptive equipment;sit to/from stand Pt Will Transfer to Toilet: with supervision;ambulating(LRAD for amb, BSC over toilet or use of toiler riser) Additional ADL Goal #1: Pt will verbalize plan to implement at least 1 learned falls prevention strategy to maximize safety in the home.  OT Frequency: Min 1X/week   Barriers to D/C: Decreased caregiver support          Co-evaluation              AM-PAC OT "6 Clicks" Daily Activity     Outcome Measure Help from another person eating meals?: None Help from another person taking care of personal grooming?: None Help from another person toileting, which includes using toliet, bedpan, or urinal?: A Lot Help from another person  bathing (including washing, rinsing, drying)?: A Little Help from another person to put on and taking off regular upper body clothing?: A Little Help from another person to put on and taking off regular lower body clothing?: A Little 6 Click Score: 19   End of Session Equipment Utilized During Treatment: Rolling walker  Activity Tolerance: Patient tolerated treatment well Patient left: in bed;with call bell/phone within reach;with bed alarm set  OT Visit  Diagnosis: Other abnormalities of gait and mobility (R26.89);Muscle weakness (generalized) (M62.81);Pain Pain - Right/Left: Left Pain - part of body: Hip;Knee;Leg                Time: [id number] OT Time Calculation (min): 39 min Charges:  OT General Charges $OT Visit: 1 Visit OT Evaluation $OT Eval Moderate Complexity: 1 Mod OT Treatments $Self Care/Home Management : 23-37 mins  Richrd PrimeJamie Stiller, MPH, MS, OTR/L ascom [phone] 07/09/18, 2:19 PM

## 2018-07-10 DIAGNOSIS — I70213 Atherosclerosis of native arteries of extremities with intermittent claudication, bilateral legs: Secondary | ICD-10-CM

## 2018-07-10 LAB — BASIC METABOLIC PANEL
Anion gap: 5 (ref 5–15)
BUN: 21 mg/dL (ref 8–23)
CO2: 25 mmol/L (ref 22–32)
Calcium: 8.5 mg/dL — ABNORMAL LOW (ref 8.9–10.3)
Chloride: 106 mmol/L (ref 98–111)
Creatinine, Ser: 0.99 mg/dL (ref 0.44–1.00)
GFR calc Af Amer: >60 mL/min (ref >60)
GFR calc non Af Amer: 57 mL/min — ABNORMAL LOW (ref >60)
Glucose, Bld: 140 mg/dL — ABNORMAL HIGH (ref 70–99)
Potassium: 4.9 mmol/L (ref 3.5–5.1)
Sodium: 136 mmol/L (ref 135–145)

## 2018-07-10 LAB — CBC
HCT: 34.7 % — ABNORMAL LOW (ref 36.0–46.0)
Hemoglobin: 10.9 g/dL — ABNORMAL LOW (ref 12.0–15.0)
MCH: 27.9 pg (ref 26.0–34.0)
MCHC: 31.4 g/dL (ref 30.0–36.0)
MCV: 89 fL (ref 80.0–100.0)
Platelets: 141 10*3/uL — ABNORMAL LOW (ref 150–400)
RBC: 3.9 MIL/uL (ref 3.87–5.11)
RDW: 13.7 % (ref 11.5–15.5)
WBC: 11.4 10*3/uL — ABNORMAL HIGH (ref 4.0–10.5)
nRBC: 0 % (ref 0.0–0.2)

## 2018-07-10 LAB — MAGNESIUM: Magnesium: 1.7 mg/dL (ref 1.7–2.4)

## 2018-07-10 MED ORDER — FAMOTIDINE 20 MG PO TABS: 20.0000 mg | ORAL_TABLET | Freq: Two times a day (BID) | ORAL | Status: DC

## 2018-07-10 MED ORDER — HYDROCODONE-ACETAMINOPHEN 5-325 MG PO TABS: 0.5000 | ORAL_TABLET | Freq: Four times a day (QID) | ORAL | 0 refills | Status: DC | PRN

## 2018-07-10 MED ORDER — CLOPIDOGREL BISULFATE 75 MG PO TABS: 75.0000 mg | ORAL_TABLET | Freq: Every day | ORAL | 0 refills | Status: DC

## 2018-07-10 NOTE — Care Management Important Message (Signed)
Copy of signed Medicare IM left with patient in room. 

## 2018-07-10 NOTE — Discharge Summary (Signed)
[facility]    Discharge Summary   Patient ID:  Andrea Holden MRN: [id number] DOB/AGE: 1945-12-31 73 y.o.  Admit date: 07/08/2018 Discharge date: 07/10/2018 Date of Surgery: 07/08/2018 Surgeon: Surgeon(s): Dew, Marlow Baars, MD Schnier, Latina Craver, MD  Admission Diagnosis: ASO WITH CLAUDICATION  Discharge Diagnoses:  ASO WITH CLAUDICATION  Secondary Diagnoses: Past Medical History:  Diagnosis Date  . Allergy    seasonal allergies  . Anemia 2014   needed 5 units of blood d/t passing out, weak  . Anxiety   . Aortic stenosis 12/24/2017   Echo Aug 2018  . Arthritis   . Asthma    allergy induced asthma  . Cardiac murmur 12/12/2017  . Cataract   . Cataract    left  . Complication of anesthesia    arrhythmia following colonoscopy  . Degenerative disc disease, lumbar   . Degenerative disc disease, lumbar   . Depression   . Diabetes mellitus   . Diabetic neuropathy (HCC) 11/16/2015  . Dysrhythmia    stenosis of left ventricle  . GERD (gastroesophageal reflux disease)   . H/O transfusion    patient was given 5 units of blood while at [facility], blood type O+  . History of chicken pox   . History of hiatal hernia   . History of measles, mumps, or rubella   . HOH (hard of hearing)    does not use hearing aides yet  . Hyperlipidemia   . Hypertension   . Irregular heartbeat   . LVH (left ventricular hypertrophy) 12/24/2017   Echo Aug 2018  . Neuropathy   . Opiate use 11/16/2015  . Peripheral arterial disease (HCC) 05/06/2018   At rest, left > right; refer to vasc  . Pes planus of both feet 11/16/2015  . Plantar fasciitis of right foot 11/16/2015  . Wheezing    Procedure(s): ENDARTERECTOMY FEMORAL LOWER EXTREMITY ANGIOGRAM  Discharged Condition: good  HPI:  The patient is a 73 year old female with a known past medical history of peripheral artery disease who presented to our clinic with progressively worsening left lower extremity claudication  and rest pain.   On 07/08/18, the patient underwent a left common femoral and profunda femoris endarterectomies and patch angioplasty, separate left superficial femoral artery endarterectomy and patch angioplasty, left lower extremity angiogram, viabahn stent placement x2 with a 6 mm diameter by 15 cm length and 6 mm diameter by 5 cm length stent for mid and distal left SFA high-grade stenosis, percutaneous transluminal angioplasty of the left popliteal artery with 4 mm diameter by 6 cm length Lutonix drug-coated balloon for 70% left popliteal artery stenosis.  The patient tolerated the procedure well and was transferred to the surgical floor without issue.  The patient's night of surgery was unremarkable.  During the patient's brief inpatient stay she received occupational and physical therapy.  Physical therapy recommended home PT.  The patient's diet was advanced, her pain was controlled to the use of p.o. pain medication, she was urinating without issue and ambulating independently.  The patient's physical exam including her left lower extremity is now well perfused.  Hospital Course:  Andrea Holden is a 73 y.o. female is S/P Left:  Procedure(s): ENDARTERECTOMY FEMORAL LOWER EXTREMITY ANGIOGRAM  Extubated: POD # 0  Physical exam:  A&Ox3, NAD CV: RRR Pulm: CTA Bilaterally  Abdomen: Soft, Non-tender, Non-distended, (+) Bowel Sounds Left Groin: VAC removed. Incision / staples are clean, dry and intact. Minimal ecchymosis noted. No swelling or erythema.  Left lower extremity: Thigh soft, calf soft. Extremity is warm distally to toes.   Post-op wounds clean, dry, intact or healing well  Pt. Ambulating, voiding and taking PO diet without difficulty.  Pt pain controlled with PO pain meds.  Labs as below  Complications:none  Consults: None  Significant Diagnostic Studies: CBC Lab Results  Component Value Date   WBC 11.4 (H) 07/10/2018   HGB 10.9 (L) 07/10/2018   HCT 34.7 (L)  07/10/2018   MCV 89.0 07/10/2018   PLT 141 (L) 07/10/2018   BMET    Component Value Date/Time   NA 136 07/10/2018 0513   NA 133 (L) 03/07/2016 1317   NA 135 10/12/2014 0516   K 4.9 07/10/2018 0513   K 4.3 10/12/2014 0516   CL 106 07/10/2018 0513   CL 109 10/12/2014 0516   CO2 25 07/10/2018 0513   CO2 24 10/12/2014 0516   GLUCOSE 140 (H) 07/10/2018 0513   GLUCOSE 146 (H) 10/12/2014 0516   BUN 21 07/10/2018 0513   BUN 18 03/07/2016 1317   BUN 39 (H) 10/12/2014 0516   CREATININE 0.99 07/10/2018 0513   CREATININE 0.85 04/23/2018 1204   CALCIUM 8.5 (L) 07/10/2018 0513   CALCIUM 7.6 (L) 10/12/2014 0516   GFRNONAA 57 (L) 07/10/2018 0513   GFRNONAA 69 04/23/2018 1204   GFRAA >60 07/10/2018 0513   GFRAA 80 04/23/2018 1204   COAG Lab Results  Component Value Date   INR 0.91 06/29/2018   Disposition:  Discharge to :Home  Allergies as of 07/10/2018      Reactions   Meloxicam Other (See Comments)   GI bleeding   Nsaids Other (See Comments)   Gi bleeding   Sitagliptin Hives, Itching   Januvia       Medication List    TAKE these medications   acetaminophen 325 MG tablet Commonly known as:  TYLENOL Take 325 mg by mouth every 6 (six) hours as needed.   albuterol 108 (90 Base) MCG/ACT inhaler Commonly known as:  PROVENTIL HFA;VENTOLIN HFA Inhale 2 puffs into the lungs every 6 (six) hours as needed for wheezing or shortness of breath.   atorvastatin 10 MG tablet Commonly known as:  LIPITOR Take 1 tablet (10 mg total) by mouth at bedtime.   calcium carbonate 500 MG chewable tablet Commonly known as:  TUMS - dosed in mg elemental calcium Chew 3 tablets by mouth daily. Takes as she remembers   clopidogrel 75 MG tablet Commonly known as:  PLAVIX Take 1 tablet (75 mg total) by mouth daily. Start taking on:  July 11, 2018   co-enzyme Q-10 30 MG capsule Take 100 mg by mouth daily.   famotidine 20 MG tablet Commonly known as:  PEPCID Take 1 tablet (20 mg total) by  mouth 2 (two) times daily as needed for heartburn or indigestion.   fluticasone 50 MCG/ACT nasal spray Commonly known as:  FLONASE Place 2 sprays into the nose daily as needed.   gabapentin 400 MG capsule Commonly known as:  NEURONTIN Take 1 capsule (400 mg total) by mouth 2 (two) times daily.   HYDROcodone-acetaminophen 5-325 MG tablet Commonly known as:  NORCO Take 0.5 tablets by mouth every 6 (six) hours as needed for moderate pain. What changed:  Another medication with the same name was removed. Continue taking this medication, and follow the directions you see here.   lisinopril 10 MG tablet Commonly known as:  PRINIVIL,ZESTRIL TAKE 1 TABLET BY MOUTH ONCE DAILY   Melatonin  5 MG Tabs Take 1 tablet by mouth at bedtime.   MULTIVITAMIN ADULTS 50+ Tabs Take 1 tablet by mouth daily.   sertraline 100 MG tablet Commonly known as:  ZOLOFT TAKE 1 AND 1/2 TABLETS BY MOUTH DAILY FOR A TOTAL OF 150 MG   varenicline 1 MG tablet Commonly known as:  CHANTIX CONTINUING MONTH PAK Take 1 tablet (1 mg total) by mouth 2 (two) times daily. ** FILL AFTER THE STARTER MONTH PAK **   vitamin C 500 MG tablet Commonly known as:  ASCORBIC ACID Take 500 mg by mouth every other day.      Verbal and written Discharge instructions given to the patient. Wound care per Discharge AVS Follow-up Information    Dew, Marlow BaarsJason S, MD Follow up in 1 week(s).   Specialties:  Vascular Surgery, Radiology, Interventional Cardiology Why:  Can see midlevel. Post-op incision check.  Contact information: [address] [place] [id number] [phone]          Signed: Tonette LedererKIMBERLY A Yassir Enis, PA-C  07/10/2018, 11:52 AM

## 2018-07-10 NOTE — Care Management Note (Signed)
Case Management Note  Patient Details  Name: Andrea Holden MRN: [id number] Date of Birth: Dec 03, 1945   Patient to discharge today.  Patient s/p left common femoral andprofunda femoris endarterectomies .  Patient states she lives at home alone. Daughter and son in law live locally for support. PCP [facility].  Patient states at baseline she drives.  Her friend will be picking her up today, and available to take her to any appointments.  PT has evaluated patient and recommends home health.  Patient agreeable.  Patient states "the one my doctor recommended is [facility]".  RNCM confirmed patient wanted to use [facility].  CMS Medicare.gov Compare Post Acute Care list reviewed with patient.  Cassie with [facility] notified of discharge. Jason with [facility] to deliver [facility] and RW prior to discharge.   Subjective/Objective:                    Action/Plan:   Expected Discharge Date:  07/10/18               Expected Discharge Plan:  Home w Home Health Services  In-House Referral:     Discharge planning Services  CM Consult  Post Acute Care Choice:  Durable Medical Equipment, Home Health Choice offered to:  Patient  DME Arranged:  Walker rolling, 3-N-1 DME Agency:  [facility]  HH Arranged:  RN, PT [facility] Agency:  [facility]  Status of Service:  Completed, signed off  If discussed at Long Length of Stay Meetings, dates discussed:    Additional Comments:  Chapman Fitch, RN 07/10/2018, 1:47 PM

## 2018-07-10 NOTE — Progress Notes (Signed)
PHARMACIST - PHYSICIAN COMMUNICATION  DR:   Wyn Quaker  CONCERNING: IV to Oral Route Change Policy  RECOMMENDATION: This patient is receiving famotidine by the intravenous route.  Based on criteria approved by the Pharmacy and Therapeutics Committee, the intravenous medication(s) is/are being converted to the equivalent oral dose form(s).   DESCRIPTION: These criteria include:  The patient is eating (either orally or via tube) and/or has been taking other orally administered medications for a least 24 hours  The patient has no evidence of active gastrointestinal bleeding or impaired GI absorption (gastrectomy, short bowel, patient on TNA or NPO).  If you have questions about this conversion, please contact the Pharmacy Department  []   [phone] )  [facility] [x]   [phone] )  [facility] []   [phone] )  [facility] []   [phone] )  [facility] []   [phone] )  [facility]   Lowella Bandy, [facility] 07/10/2018 9:09 AM

## 2018-07-10 NOTE — Discharge Instructions (Signed)
You may remove your dressing and shower as of tomorrow. Keep incision and groin clean and dry.  No driving while on pain medication.

## 2018-07-13 ENCOUNTER — Telehealth (INDEPENDENT_AMBULATORY_CARE_PROVIDER_SITE_OTHER): Payer: Self-pay

## 2018-07-13 ENCOUNTER — Other Ambulatory Visit: Payer: Self-pay

## 2018-07-13 NOTE — Patient Outreach (Signed)
[facility] Care Management  07/13/2018  Andrea Holden [birth year] [id number]   EMMI-GENERAL DISCHARGE  RED ON EMMI ALERT Day # 1 Date: 07/12/18   10:52 AM Red Alert Reason: "Transportation to follow-up? No", "Unfilled prescriptions? Yes", "Wounds healing well? No"   Outreach attempt # 1 successful.   Spoke with patient, HIPAA verified.   Reviewed and addressed red alerts. Patient states she is unsure who will provide her with transportation for her post-op Vascular f/u appointment scheduled for 07/17/18. She lives alone but has several friends close by to assist her if needed. RN CM informed patient of [facility] to assist with resources for transportation. Patient declines and has been given resources by [facility] CM. She will call today to inquire.   Patient did not get her Plavix filled on Saturday upon d/c home due to her pharmacy was closed. She will have it filled and delivered today. RN CM instructed patient on importance of starting Plavix ASAP to help prevent DVT. She verbalizes understanding. Patient is taking all other medications exactly as prescribed, denies questions or financial hardship at this time.   Patient reports her wounds are healing, she admits to having increased edema to her left lower leg today but states she has been more active. She applied her compression stocking to the extremity and is elevating while resting. She denies having increased pain, redness or warmth to the effected leg/foot. RN CM instructed patient to contact her Vascular surgeon today to confirm OK for reapplying compression stocking to the effected extremity as this is not noted on d/c papers. RN CM provided patient with the phone number for the Vascular Surgeon's office listed on d/c summary. She verbalizes understanding and will call today. Patient is expecting a HH nurse today from [facility] to help with wound care. She is washing the wound with a Hibiclens solutions,  patting dry and applying a interdry bandage directly on the wound located on her left groin.   Patient is aware of her f/u with PCP Dr. Sherie Don set for 07/24/18. She denies having further questions or concerns at this time. She is agreeable to a f/u call in 1-2 weeks.   Plan: RN CM will outreach to patient in 1-2 weeks to assess for CM needs and progress towards healing.    [name], RN,CCM [facility] Care Management Care Management Coordinator Direct Phone: [phone] Toll Free: [phone] Fax: [phone]

## 2018-07-13 NOTE — Telephone Encounter (Signed)
Spoke with the patient and gave her the recommendations from Dr. Wyn Quaker. See note below.

## 2018-07-13 NOTE — Telephone Encounter (Signed)
Patient called stating that she has some soreness from her femoral endarterectomy surgery on 07/08/2018, patient states that she put on her ted hose from the hospital and her insurance company [employer] and Dr. Sherie DonLada thought she should contact our office to find out if that is okay.

## 2018-07-14 ENCOUNTER — Ambulatory Visit: Payer: Self-pay | Admitting: Pharmacist

## 2018-07-14 DIAGNOSIS — Z599 Problem related to housing and economic circumstances, unspecified: Secondary | ICD-10-CM

## 2018-07-14 DIAGNOSIS — I739 Peripheral vascular disease, unspecified: Secondary | ICD-10-CM

## 2018-07-14 DIAGNOSIS — Z598 Other problems related to housing and economic circumstances: Secondary | ICD-10-CM

## 2018-07-14 NOTE — Chronic Care Management (AMB) (Deleted)
  Chronic Care Management   Note  07/14/2018 Name: Andrea Holden MRN: [id number] DOB: 03-02-1946   73 y.o. year old female referred to Chronic Care Management by [name], [facility] RN for chronic disease management needs. Chronic conditions include HTN, CAD, PAD, HLD . Upcoming office visit with Kerman Passey, MD on 07/24/18.   Was unable to reach patient via telephone today and have left HIPAA compliant voicemail asking patient to return my call. (unsuccessful outreach #1).  Follow Up Plan: Will follow-up within 3-5  business days via telephone.    Karalee Height, PharmD Clinical Pharmacist [facility] [phone]

## 2018-07-15 DIAGNOSIS — E1142 Type 2 diabetes mellitus with diabetic polyneuropathy: Secondary | ICD-10-CM | POA: Diagnosis not present

## 2018-07-15 DIAGNOSIS — M5416 Radiculopathy, lumbar region: Secondary | ICD-10-CM | POA: Diagnosis not present

## 2018-07-15 DIAGNOSIS — J449 Chronic obstructive pulmonary disease, unspecified: Secondary | ICD-10-CM | POA: Diagnosis not present

## 2018-07-15 DIAGNOSIS — I70213 Atherosclerosis of native arteries of extremities with intermittent claudication, bilateral legs: Secondary | ICD-10-CM | POA: Diagnosis not present

## 2018-07-15 DIAGNOSIS — I119 Hypertensive heart disease without heart failure: Secondary | ICD-10-CM | POA: Diagnosis not present

## 2018-07-15 DIAGNOSIS — G8929 Other chronic pain: Secondary | ICD-10-CM | POA: Diagnosis not present

## 2018-07-15 DIAGNOSIS — I350 Nonrheumatic aortic (valve) stenosis: Secondary | ICD-10-CM | POA: Diagnosis not present

## 2018-07-15 DIAGNOSIS — I25118 Atherosclerotic heart disease of native coronary artery with other forms of angina pectoris: Secondary | ICD-10-CM | POA: Diagnosis not present

## 2018-07-15 DIAGNOSIS — Z48812 Encounter for surgical aftercare following surgery on the circulatory system: Secondary | ICD-10-CM | POA: Diagnosis not present

## 2018-07-15 NOTE — Patient Instructions (Signed)
Ms. Kief was given information about Chronic Care Management services today including:  1. CCM service includes personalized support from designated clinical staff supervised by her physician, including individualized plan of care and coordination with other care providers 2. 24/7 contact phone numbers for assistance for urgent and routine care needs. 3. Service will only be billed when office clinical staff spend 20 minutes or more in a month to coordinate care. 4. Only one practitioner may furnish and bill the service in a calendar month. 5. The patient may stop CCM services at any time (effective at the end of the month) by phone call to the office staff. 6. The patient will be responsible for cost sharing (co-pay) of up to 20% of the service fee (after annual deductible is met).  Patient agreed to services and verbal consent obtained.

## 2018-07-15 NOTE — Chronic Care Management (AMB) (Signed)
  Chronic Care Management   Note  07/15/2018 Name: Andrea Holden MRN: [id number] DOB: 11/29/1945  Andrea Holden is a 73 y.o. year old female who sees Lada, Satira Anis, MD for primary care. Angel Little, [facility] Transitions of Care RN asked the CCM team to consult the patient for assistance with chronic disease management related to multiple chronic diseases. Referral was placed 07/13/17. Telephone outreach to patient today to introduce CCM services and voicemail left. Patient immediately returned call. HIPAA identifiers verified.   Andrea Holden agreed that CCM services would be helpful to her.   Plan: Patient agreed to services and verbal consent obtained. I have scheduled an appointment for Andrea Holden for January 25/2020   Elizbeth J Pegg was given information about Chronic Care Management services today including:  1. CCM service includes personalized support from designated clinical staff supervised by her physician, including individualized plan of care and coordination with other care providers 2. 24/7 contact phone numbers for assistance for urgent and routine care needs. 3. Service will only be billed when office clinical staff spend 20 minutes or more in a month to coordinate care. 4. Only one practitioner may furnish and bill the service in a calendar month. 5. The patient may stop CCM services at any time (effective at the end of the month) by phone call to the office staff. 6. The patient will be responsible for cost sharing (co-pay) of up to 20% of the service fee (after annual deductible is met).   Ruben Reason, PharmD Clinical Pharmacist [facility] [phone]

## 2018-07-16 DIAGNOSIS — I25118 Atherosclerotic heart disease of native coronary artery with other forms of angina pectoris: Secondary | ICD-10-CM | POA: Diagnosis not present

## 2018-07-16 DIAGNOSIS — I119 Hypertensive heart disease without heart failure: Secondary | ICD-10-CM | POA: Diagnosis not present

## 2018-07-16 DIAGNOSIS — I70213 Atherosclerosis of native arteries of extremities with intermittent claudication, bilateral legs: Secondary | ICD-10-CM | POA: Diagnosis not present

## 2018-07-16 DIAGNOSIS — M5416 Radiculopathy, lumbar region: Secondary | ICD-10-CM | POA: Diagnosis not present

## 2018-07-16 DIAGNOSIS — Z48812 Encounter for surgical aftercare following surgery on the circulatory system: Secondary | ICD-10-CM | POA: Diagnosis not present

## 2018-07-16 DIAGNOSIS — E1142 Type 2 diabetes mellitus with diabetic polyneuropathy: Secondary | ICD-10-CM | POA: Diagnosis not present

## 2018-07-16 DIAGNOSIS — I350 Nonrheumatic aortic (valve) stenosis: Secondary | ICD-10-CM | POA: Diagnosis not present

## 2018-07-16 DIAGNOSIS — J449 Chronic obstructive pulmonary disease, unspecified: Secondary | ICD-10-CM | POA: Diagnosis not present

## 2018-07-16 DIAGNOSIS — G8929 Other chronic pain: Secondary | ICD-10-CM | POA: Diagnosis not present

## 2018-07-17 ENCOUNTER — Ambulatory Visit (INDEPENDENT_AMBULATORY_CARE_PROVIDER_SITE_OTHER): Payer: Medicare HMO | Admitting: Nurse Practitioner

## 2018-07-17 ENCOUNTER — Encounter (INDEPENDENT_AMBULATORY_CARE_PROVIDER_SITE_OTHER): Payer: Self-pay | Admitting: Nurse Practitioner

## 2018-07-17 ENCOUNTER — Telehealth (INDEPENDENT_AMBULATORY_CARE_PROVIDER_SITE_OTHER): Payer: Self-pay

## 2018-07-17 VITALS — BP 148/61 | HR 76 | Resp 16 | Ht 61.0 in | Wt 216.2 lb

## 2018-07-17 DIAGNOSIS — E782 Mixed hyperlipidemia: Secondary | ICD-10-CM

## 2018-07-17 DIAGNOSIS — I739 Peripheral vascular disease, unspecified: Secondary | ICD-10-CM

## 2018-07-17 DIAGNOSIS — K219 Gastro-esophageal reflux disease without esophagitis: Secondary | ICD-10-CM

## 2018-07-17 NOTE — Telephone Encounter (Signed)
I spoke with Cala Bradford from [facility] regarding Dr. Driscilla Grammes recommendation that the patient have a Provena wound vac placed and per Cala Bradford she has spoken with [facility] and apparently that particular wound vac is placed in the OR, so [facility] rep will speak with Dr. Wyn Quaker and discuss if it will be okay for [facility] nurses to place and change the vac.

## 2018-07-18 DIAGNOSIS — Z48812 Encounter for surgical aftercare following surgery on the circulatory system: Secondary | ICD-10-CM | POA: Diagnosis not present

## 2018-07-18 DIAGNOSIS — I119 Hypertensive heart disease without heart failure: Secondary | ICD-10-CM | POA: Diagnosis not present

## 2018-07-18 DIAGNOSIS — G8929 Other chronic pain: Secondary | ICD-10-CM | POA: Diagnosis not present

## 2018-07-18 DIAGNOSIS — I350 Nonrheumatic aortic (valve) stenosis: Secondary | ICD-10-CM | POA: Diagnosis not present

## 2018-07-18 DIAGNOSIS — I25118 Atherosclerotic heart disease of native coronary artery with other forms of angina pectoris: Secondary | ICD-10-CM | POA: Diagnosis not present

## 2018-07-18 DIAGNOSIS — J449 Chronic obstructive pulmonary disease, unspecified: Secondary | ICD-10-CM | POA: Diagnosis not present

## 2018-07-18 DIAGNOSIS — I70213 Atherosclerosis of native arteries of extremities with intermittent claudication, bilateral legs: Secondary | ICD-10-CM | POA: Diagnosis not present

## 2018-07-18 DIAGNOSIS — M5416 Radiculopathy, lumbar region: Secondary | ICD-10-CM | POA: Diagnosis not present

## 2018-07-18 DIAGNOSIS — E1142 Type 2 diabetes mellitus with diabetic polyneuropathy: Secondary | ICD-10-CM | POA: Diagnosis not present

## 2018-07-20 DIAGNOSIS — M5416 Radiculopathy, lumbar region: Secondary | ICD-10-CM | POA: Diagnosis not present

## 2018-07-20 DIAGNOSIS — E1142 Type 2 diabetes mellitus with diabetic polyneuropathy: Secondary | ICD-10-CM | POA: Diagnosis not present

## 2018-07-20 DIAGNOSIS — Z48812 Encounter for surgical aftercare following surgery on the circulatory system: Secondary | ICD-10-CM | POA: Diagnosis not present

## 2018-07-20 DIAGNOSIS — I350 Nonrheumatic aortic (valve) stenosis: Secondary | ICD-10-CM | POA: Diagnosis not present

## 2018-07-20 DIAGNOSIS — I119 Hypertensive heart disease without heart failure: Secondary | ICD-10-CM | POA: Diagnosis not present

## 2018-07-20 DIAGNOSIS — I70213 Atherosclerosis of native arteries of extremities with intermittent claudication, bilateral legs: Secondary | ICD-10-CM | POA: Diagnosis not present

## 2018-07-20 DIAGNOSIS — J449 Chronic obstructive pulmonary disease, unspecified: Secondary | ICD-10-CM | POA: Diagnosis not present

## 2018-07-20 DIAGNOSIS — G8929 Other chronic pain: Secondary | ICD-10-CM | POA: Diagnosis not present

## 2018-07-20 DIAGNOSIS — I25118 Atherosclerotic heart disease of native coronary artery with other forms of angina pectoris: Secondary | ICD-10-CM | POA: Diagnosis not present

## 2018-07-20 NOTE — Telephone Encounter (Signed)
Patient called wanting to know what was going on with the wound vac. I explained that the doctor, homehealth and [facility] were discussing  the best course of action as far as placing the wound vac and the homehealth would be informed when that was decided. I also let the patient know that until that has been decided the homehealth nurse is to continue the dressing changes as before.

## 2018-07-21 ENCOUNTER — Encounter (INDEPENDENT_AMBULATORY_CARE_PROVIDER_SITE_OTHER): Payer: Self-pay | Admitting: Nurse Practitioner

## 2018-07-21 ENCOUNTER — Ambulatory Visit: Payer: Self-pay

## 2018-07-21 DIAGNOSIS — M5416 Radiculopathy, lumbar region: Secondary | ICD-10-CM | POA: Diagnosis not present

## 2018-07-21 DIAGNOSIS — J449 Chronic obstructive pulmonary disease, unspecified: Secondary | ICD-10-CM | POA: Diagnosis not present

## 2018-07-21 DIAGNOSIS — E1142 Type 2 diabetes mellitus with diabetic polyneuropathy: Secondary | ICD-10-CM | POA: Diagnosis not present

## 2018-07-21 DIAGNOSIS — I70213 Atherosclerosis of native arteries of extremities with intermittent claudication, bilateral legs: Secondary | ICD-10-CM | POA: Diagnosis not present

## 2018-07-21 DIAGNOSIS — Z48812 Encounter for surgical aftercare following surgery on the circulatory system: Secondary | ICD-10-CM | POA: Diagnosis not present

## 2018-07-21 DIAGNOSIS — G8929 Other chronic pain: Secondary | ICD-10-CM | POA: Diagnosis not present

## 2018-07-21 DIAGNOSIS — I25118 Atherosclerotic heart disease of native coronary artery with other forms of angina pectoris: Secondary | ICD-10-CM | POA: Diagnosis not present

## 2018-07-21 DIAGNOSIS — I119 Hypertensive heart disease without heart failure: Secondary | ICD-10-CM | POA: Diagnosis not present

## 2018-07-21 DIAGNOSIS — I350 Nonrheumatic aortic (valve) stenosis: Secondary | ICD-10-CM | POA: Diagnosis not present

## 2018-07-21 NOTE — Progress Notes (Addendum)
Subjective:    Patient ID: Andrea Holden, female    DOB: 08-15-1945, 73 y.o.   MRN: [id number] Chief Complaint  Patient presents with  . Follow-up    HPI  Andrea Holden is a 73 y.o. female that presents today for a wound check following femoral endarterectomy of the left lower extremity on 07/08/2018.  The patient denies any pain currently although she does endorse that where the incision is makes it hard for her to be somewhat mobile.  She denies any fever, chills, nausea, vomiting or diarrhea.  She denies any chest pain or shortness of breath.  She denies any signs of distal embolization such as numbness or color changes of a distal digit.  She does endorse some swelling on the left lower extremity.  She denies wearing any medical grade 1 compression stockings at this time.  Her wound continues to have staples.  Upon examination it is somewhat macerated edges with early signs of dehiscence.  There is also a foul-smelling odor coming from the wound.  There is also damp in the groin/pannus.  Past Medical History:  Diagnosis Date  . Allergy    seasonal allergies  . Anemia 2014   needed 5 units of blood d/t passing out, weak  . Anxiety   . Aortic stenosis 12/24/2017   Echo Aug 2018  . Arthritis   . Asthma    allergy induced asthma  . Cardiac murmur 12/12/2017  . Cataract   . Cataract    left  . Complication of anesthesia    arrhythmia following colonoscopy  . Degenerative disc disease, lumbar   . Degenerative disc disease, lumbar   . Depression   . Diabetes mellitus   . Diabetic neuropathy (HCC) 11/16/2015  . Dysrhythmia    stenosis of left ventricle  . GERD (gastroesophageal reflux disease)   . H/O transfusion    patient was given 5 units of blood while at [facility], blood type O+  . History of chicken pox   . History of hiatal hernia   . History of measles, mumps, or rubella   . HOH (hard of hearing)    does not use hearing aides yet  . Hyperlipidemia   .  Hypertension   . Irregular heartbeat   . LVH (left ventricular hypertrophy) 12/24/2017   Echo Aug 2018  . Neuropathy   . Opiate use 11/16/2015  . Peripheral arterial disease (HCC) 05/06/2018   At rest, left > right; refer to vasc  . Pes planus of both feet 11/16/2015  . Plantar fasciitis of right foot 11/16/2015  . Wheezing     Past Surgical History:  Procedure Laterality Date  . ABDOMINAL HYSTERECTOMY    . banck injections    . CATARACT EXTRACTION W/PHACO Right 05/30/2015   Procedure: CATARACT EXTRACTION PHACO AND INTRAOCULAR LENS PLACEMENT (IOC);  Surgeon: Galen Manila, MD;  Location: [facility] ORS;  Service: Ophthalmology;  Laterality: Right;  [place] 00:57 AP% 20.9 CDE 11.99 fluid pack lot #[id number]  . CHOLECYSTECTOMY  1970  . COLON SURGERY  2013   blocked colon  . COLONOSCOPY WITH PROPOFOL N/A 01/20/2018   Procedure: COLONOSCOPY WITH PROPOFOL;  Surgeon: Midge Minium, MD;  Location: [facility] ENDOSCOPY;  Service: Endoscopy;  Laterality: N/A;  . ENDARTERECTOMY FEMORAL Left 07/08/2018   Procedure: ENDARTERECTOMY FEMORAL;  Surgeon: Annice Needy, MD;  Location: [facility] ORS;  Service: Vascular;  Laterality: Left;  . EYE SURGERY Right    cataract surgery  . FOOT FUSION Right 2018  metal in foot  . GALLBLADDER SURGERY  1970  . GASTRIC BYPASS  2010   lost 178 lbs and regained 40 lbs last few years  . internal bleeding  2016   ulcer in past  . LOWER EXTREMITY ANGIOGRAM Left 07/08/2018   Procedure: LOWER EXTREMITY ANGIOGRAM;  Surgeon: Annice Needyew, Jason S, MD;  Location: [facility] ORS;  Service: Vascular;  Laterality: Left;  . LOWER EXTREMITY ANGIOGRAPHY Left 05/27/2018   Procedure: LOWER EXTREMITY ANGIOGRAPHY;  Surgeon: Annice Needyew, Jason S, MD;  Location: [facility] INVASIVE CV LAB;  Service: Cardiovascular;  Laterality: Left;  . MOUTH SURGERY     root canals and crowns and extractions  . OVARY SURGERY    . SKIN GRAFT Right 2018   RT foot. foot has been rebuilt.  it is full of metal  . TENOTOMY ACHILLES TENDON Right     Percuntaneous. metal in foot  . TONSILLECTOMY      Social History   Socioeconomic History  . Marital status: Divorced    Spouse name: Not on file  . Number of children: 1  . Years of education: Not on file  . Highest education level: Associate degree: academic program  Occupational History  . Occupation: Lawyerubstitute Teacher    Comment: [place] [employer]  Social Needs  . Financial resource strain: Very hard  . Food insecurity:    Worry: Never true    Inability: Never true  . Transportation needs:    Medical: No    Non-medical: No  Tobacco Use  . Smoking status: Former Smoker    Packs/day: 0.50    Years: 55.00    Pack years: 27.50    Types: Cigarettes    Start date: 07/01/1960    Last attempt to quit: 05/26/2018    Years since quitting: 0.1  . Smokeless tobacco: Never Used  . Tobacco comment: still using chantix  Substance and Sexual Activity  . Alcohol use: Yes    Alcohol/week: 0.0 standard drinks    Comment: 2 drinks per month  . Drug use: Not Currently    Types: Marijuana    Comment: used for pain  . Sexual activity: Not Currently  Lifestyle  . Physical activity:    Days per week: 0 days    Minutes per session: 0 min  . Stress: Only a little  Relationships  . Social connections:    Talks on phone: Patient refused    Gets together: Patient refused    Attends religious service: Patient refused    Active member of club or organization: Patient refused    Attends meetings of clubs or organizations: Patient refused    Relationship status: Divorced  . Intimate partner violence:    Fear of current or ex partner: No    Emotionally abused: No    Physically abused: No    Forced sexual activity: No  Other Topics Concern  . Not on file  Social History Narrative  . Not on file    Family History  Problem Relation Age of Onset  . Asthma Mother   . COPD Mother   . Arthritis Father   . Depression Father   . Heart disease Father   .  Hypertension Father   . Stroke Father   . Heart attack Father   . Arthritis Brother   . Depression Brother   . Diabetes Brother   . Heart disease Brother   . Hyperlipidemia Brother   . Hypertension Brother   . Stroke Brother   .  Vision loss Brother   . Heart attack Brother   . Diabetes Maternal Grandmother   . Thyroid disease Daughter   . Colitis Daughter   . Breast cancer Neg Hx     Allergies  Allergen Reactions  . Meloxicam Other (See Comments)    GI bleeding  . Nsaids Other (See Comments)    Gi bleeding  . Sitagliptin Hives and Itching    Januvia      Review of Systems   Review of Systems: Negative Unless Checked Constitutional: [] Weight loss  [] Fever  [] Chills Cardiac: [] Chest pain   []  Atrial Fibrillation  [] Palpitations   [] Shortness of breath when laying flat   [] Shortness of breath with exertion. [] Shortness of breath at rest Vascular:  [] Pain in legs with walking   [] Pain in legs with standing [] Pain in legs when laying flat   [] Claudication    [] Pain in feet when laying flat    [] History of DVT   [] Phlebitis   [x] Swelling in legs   [] Varicose veins   [] Non-healing ulcers Pulmonary:   [] Uses home oxygen   [] Productive cough   [] Hemoptysis   [] Wheeze  [] COPD   [] Asthma Neurologic:  [] Dizziness   [] Seizures  [] Blackouts [] History of stroke   [] History of TIA  [] Aphasia   [] Temporary Blindness   [] Weakness or numbness in arm   [] Weakness or numbness in leg Musculoskeletal:   [] Joint swelling   [] Joint pain   [] Low back pain  []  History of Knee Replacement [] Arthritis [] back Surgeries  []  Spinal Stenosis    Hematologic:  [] Easy bruising  [] Easy bleeding   [] Hypercoagulable state   [] Anemic Gastrointestinal:  [] Diarrhea   [] Vomiting  [x] Gastroesophageal reflux/heartburn   [] Difficulty swallowing. [] Abdominal pain Genitourinary:  [] Chronic kidney disease   [] Difficult urination  [] Anuric   [] Blood in urine [] Frequent urination  [] Burning with urination   [] Hematuria Skin:   [] Rashes   [] Ulcers [x] Wounds Psychological:  [] History of anxiety   []  History of major depression  []  Memory Difficulties     Objective:   Physical Exam  BP (!) 148/61 (BP Location: Right Arm, Patient Position: Sitting)   Pulse 76   Resp 16   Ht 5\' 1"  (1.549 m)   Wt 216 lb 3.2 oz (98.1 kg)   BMI 40.85 kg/m   Gen: WD/WN, NAD Head: [place]/AT, No temporalis wasting.  Ear/Nose/Throat: Hearing grossly intact, nares w/o erythema or drainage Eyes: PER, EOMI, sclera nonicteric.  Neck: Supple, no masses.  No JVD.  Pulmonary:  Good air movement, no use of accessory muscles.  Cardiac: RRR Vascular:  Patient has approximately 12 cm long, 5 cm wide, 2 cm deep  incision on her left groin.  It is closed by staples.  Some areas of the wound show early dehiscence.  There is also some fibrinous exudate and areas.  The wound is wet with some macerated edges.  There is foul-smelling odor present Vessel Right Left  Radial Palpable Palpable   Gastrointestinal: soft, non-distended. No guarding/no peritoneal signs.  Musculoskeletal: M/S 5/5 throughout.  No deformity or atrophy.  Neurologic: Pain and light touch intact in extremities.  Symmetrical.  Speech is fluent. Motor exam as listed above. Psychiatric: Judgment intact, Mood & affect appropriate for pt's clinical situation. Dermatologic: No Venous rashes. No Ulcers Noted.  No changes consistent with cellulitis. Lymph : No Cervical lymphadenopathy, no lichenification or skin changes of chronic lymphedema.      Assessment & Plan:   1. Peripheral arterial disease (HCC) We will  coordinate with home health in order to get her a wound VAC placed on her incision.  The odor and lack of wound approximation is likely due to the area of the wound.  It is highly wet, dark area.  We will obtain an incisional wound VAC to apply 4 to 7 days cycles in order to try to facilitate better wound healing and.  Any full dehiscence or infection.  We will follow-up with  the patient after the first wound vacs placed.  The patient is also diabetic, maintained via diabetic diet  2. Mixed hyperlipidemia Continue statin as ordered and reviewed, no changes at this time   3. GERD without esophagitis Continue PPI as already ordered, this medication has been reviewed and there are no changes at this time.  Avoidence of caffeine and alcohol  Moderate elevation of the head of the bed    Current Outpatient Medications on File Prior to Visit  Medication Sig Dispense Refill  . acetaminophen (TYLENOL) 325 MG tablet Take 325 mg by mouth every 6 (six) hours as needed.     Marland Kitchen. albuterol (PROVENTIL HFA;VENTOLIN HFA) 108 (90 Base) MCG/ACT inhaler Inhale 2 puffs into the lungs every 6 (six) hours as needed for wheezing or shortness of breath. 1 Inhaler 1  . atorvastatin (LIPITOR) 10 MG tablet Take 1 tablet (10 mg total) by mouth at bedtime. 30 tablet 5  . calcium carbonate (TUMS - DOSED IN MG ELEMENTAL CALCIUM) 500 MG chewable tablet Chew 3 tablets by mouth daily. Takes as she remembers    . clopidogrel (PLAVIX) 75 MG tablet Take 1 tablet (75 mg total) by mouth daily. 90 tablet 0  . co-enzyme Q-10 30 MG capsule Take 100 mg by mouth daily.     . famotidine (PEPCID) 20 MG tablet Take 1 tablet (20 mg total) by mouth 2 (two) times daily as needed for heartburn or indigestion. 60 tablet 2  . fluticasone (FLONASE) 50 MCG/ACT nasal spray Place 2 sprays into the nose daily as needed.    . gabapentin (NEURONTIN) 400 MG capsule Take 1 capsule (400 mg total) by mouth 2 (two) times daily. 60 capsule 2  . HYDROcodone-acetaminophen (NORCO) 5-325 MG tablet Take 0.5 tablets by mouth every 6 (six) hours as needed for moderate pain. 30 tablet 0  . lisinopril (PRINIVIL,ZESTRIL) 10 MG tablet TAKE 1 TABLET BY MOUTH ONCE DAILY 90 tablet 1  . Melatonin 5 MG TABS Take 1 tablet by mouth at bedtime.     . Multiple Vitamins-Minerals (MULTIVITAMIN ADULTS 50+) TABS Take 1 tablet by mouth daily.     .  sertraline (ZOLOFT) 100 MG tablet TAKE 1 AND 1/2 TABLETS BY MOUTH DAILY FOR A TOTAL OF 150 MG 135 tablet 1  . varenicline (CHANTIX CONTINUING MONTH PAK) 1 MG tablet Take 1 tablet (1 mg total) by mouth 2 (two) times daily. ** FILL AFTER THE STARTER MONTH PAK ** 60 tablet 2  . vitamin C (ASCORBIC ACID) 500 MG tablet Take 500 mg by mouth every other day.      No current facility-administered medications on file prior to visit.     There are no Patient Instructions on file for this visit. No follow-ups on file.   Georgiana SpinnerFallon E Mayah Urquidi, NP  This note was completed with Office managerDragon Dictation.  Any errors are purely unintentional.

## 2018-07-22 ENCOUNTER — Telehealth (INDEPENDENT_AMBULATORY_CARE_PROVIDER_SITE_OTHER): Payer: Self-pay

## 2018-07-22 ENCOUNTER — Other Ambulatory Visit (INDEPENDENT_AMBULATORY_CARE_PROVIDER_SITE_OTHER): Payer: Self-pay | Admitting: Vascular Surgery

## 2018-07-22 ENCOUNTER — Other Ambulatory Visit: Payer: Self-pay

## 2018-07-22 DIAGNOSIS — J449 Chronic obstructive pulmonary disease, unspecified: Secondary | ICD-10-CM | POA: Diagnosis not present

## 2018-07-22 DIAGNOSIS — I119 Hypertensive heart disease without heart failure: Secondary | ICD-10-CM | POA: Diagnosis not present

## 2018-07-22 DIAGNOSIS — M5416 Radiculopathy, lumbar region: Secondary | ICD-10-CM | POA: Diagnosis not present

## 2018-07-22 DIAGNOSIS — I350 Nonrheumatic aortic (valve) stenosis: Secondary | ICD-10-CM | POA: Diagnosis not present

## 2018-07-22 DIAGNOSIS — Z48812 Encounter for surgical aftercare following surgery on the circulatory system: Secondary | ICD-10-CM | POA: Diagnosis not present

## 2018-07-22 DIAGNOSIS — G8929 Other chronic pain: Secondary | ICD-10-CM | POA: Diagnosis not present

## 2018-07-22 DIAGNOSIS — I25118 Atherosclerotic heart disease of native coronary artery with other forms of angina pectoris: Secondary | ICD-10-CM | POA: Diagnosis not present

## 2018-07-22 DIAGNOSIS — I70213 Atherosclerosis of native arteries of extremities with intermittent claudication, bilateral legs: Secondary | ICD-10-CM | POA: Diagnosis not present

## 2018-07-22 DIAGNOSIS — E1142 Type 2 diabetes mellitus with diabetic polyneuropathy: Secondary | ICD-10-CM | POA: Diagnosis not present

## 2018-07-22 MED ORDER — CEFAZOLIN SODIUM-DEXTROSE 2-4 GM/100ML-% IV SOLN
2.0000 g | INTRAVENOUS | Status: AC
  Administered 2018-07-23: 2 g via INTRAVENOUS

## 2018-07-22 NOTE — Telephone Encounter (Signed)
Patient advised that Arline Asp the home health nurse spoke to Weir, NP within the last hour. No more questions at this time

## 2018-07-22 NOTE — Telephone Encounter (Signed)
Left message to call office back

## 2018-07-22 NOTE — Telephone Encounter (Signed)
Left message for patient to call office back.

## 2018-07-22 NOTE — Telephone Encounter (Signed)
Patient is scheduled for wound debridement and vac placement on 07/23/2018. Patient was given the arrival time of 12:15 pm and be NPO after 12:00 midnight tonight.

## 2018-07-22 NOTE — Patient Outreach (Signed)
[facility]) Care Management  07/22/2018  Lucyana Nimer [place]ow 12/31/1945 [id number]   EMMI-GENERAL DISCHARGE #1 FOLLOW-UP CALL  RED ON EMMI ALERT Day # 1 Date: 07/12/18   10:52 AM Red Alert Reason: "Transportation to follow-up? No", "Unfilled prescriptions? Yes", "Wounds healing well? No"   Outreach attempt # 1 unsuccessful. No answer. RN CM left HIPAA compliant voicemail message along with contact info.   Plan:  RN CM will make outreach attempt to patient within 3-4 business days.    [name], RN,CCM [facility] Care Management Care Management Coordinator Direct Phone: [phone] Toll Free: [phone] Fax: [phone]

## 2018-07-22 NOTE — Telephone Encounter (Signed)
Patient called and left a message on the triage line and stated that she is still having a problem with swelling. She said that she has had her right foot reconstructed and she has so much pressure from the swelling it is splitting open. Patient would like to be advised on what to do  Call back [phone]

## 2018-07-22 NOTE — Telephone Encounter (Signed)
Does she have an actual open wound on her right foot? Or is it more of an ulceration ? Can we find out when home health is coming back out? We may need to have them place an unna boot on the patient.  If they are not due to come out to her home for sometime, she may need to come into the office to be wrapped.  There is not a pill or medication that can be given to help the swelling.  The best option is for compression and elevation.

## 2018-07-23 ENCOUNTER — Other Ambulatory Visit: Payer: Self-pay

## 2018-07-23 ENCOUNTER — Observation Stay
Admission: RE | Admit: 2018-07-23 | Discharge: 2018-07-24 | Disposition: A | Discharge status: Home / Self Care | Payer: Medicare HMO | Attending: Vascular Surgery | Admitting: Vascular Surgery

## 2018-07-23 ENCOUNTER — Ambulatory Visit: Payer: Medicare HMO | Admitting: Anesthesiology

## 2018-07-23 ENCOUNTER — Encounter: Payer: Self-pay | Admitting: Anesthesiology

## 2018-07-23 ENCOUNTER — Encounter
Admission: RE | Disposition: A | Discharge status: Home / Self Care | Payer: Self-pay | Source: Home / Self Care | Attending: Vascular Surgery

## 2018-07-23 ENCOUNTER — Encounter (INDEPENDENT_AMBULATORY_CARE_PROVIDER_SITE_OTHER): Payer: Self-pay | Admitting: Nurse Practitioner

## 2018-07-23 DIAGNOSIS — Z9049 Acquired absence of other specified parts of digestive tract: Secondary | ICD-10-CM | POA: Insufficient documentation

## 2018-07-23 DIAGNOSIS — Z87891 Personal history of nicotine dependence: Secondary | ICD-10-CM | POA: Insufficient documentation

## 2018-07-23 DIAGNOSIS — T8141XA Infection following a procedure, superficial incisional surgical site, initial encounter: Secondary | ICD-10-CM | POA: Diagnosis not present

## 2018-07-23 DIAGNOSIS — I10 Essential (primary) hypertension: Secondary | ICD-10-CM | POA: Insufficient documentation

## 2018-07-23 DIAGNOSIS — D689 Coagulation defect, unspecified: Secondary | ICD-10-CM | POA: Diagnosis not present

## 2018-07-23 DIAGNOSIS — Z825 Family history of asthma and other chronic lower respiratory diseases: Secondary | ICD-10-CM | POA: Insufficient documentation

## 2018-07-23 DIAGNOSIS — F419 Anxiety disorder, unspecified: Secondary | ICD-10-CM | POA: Diagnosis not present

## 2018-07-23 DIAGNOSIS — Z90710 Acquired absence of both cervix and uterus: Secondary | ICD-10-CM | POA: Diagnosis not present

## 2018-07-23 DIAGNOSIS — I25119 Atherosclerotic heart disease of native coronary artery with unspecified angina pectoris: Secondary | ICD-10-CM | POA: Insufficient documentation

## 2018-07-23 DIAGNOSIS — Z79899 Other long term (current) drug therapy: Secondary | ICD-10-CM | POA: Diagnosis not present

## 2018-07-23 DIAGNOSIS — B9689 Other specified bacterial agents as the cause of diseases classified elsewhere: Secondary | ICD-10-CM | POA: Insufficient documentation

## 2018-07-23 DIAGNOSIS — X58XXXA Exposure to other specified factors, initial encounter: Secondary | ICD-10-CM | POA: Insufficient documentation

## 2018-07-23 DIAGNOSIS — M5136 Other intervertebral disc degeneration, lumbar region: Secondary | ICD-10-CM | POA: Diagnosis not present

## 2018-07-23 DIAGNOSIS — E785 Hyperlipidemia, unspecified: Secondary | ICD-10-CM | POA: Diagnosis not present

## 2018-07-23 DIAGNOSIS — Z9884 Bariatric surgery status: Secondary | ICD-10-CM | POA: Insufficient documentation

## 2018-07-23 DIAGNOSIS — Z8261 Family history of arthritis: Secondary | ICD-10-CM | POA: Insufficient documentation

## 2018-07-23 DIAGNOSIS — T8131XA Disruption of external operation (surgical) wound, not elsewhere classified, initial encounter: Secondary | ICD-10-CM | POA: Diagnosis not present

## 2018-07-23 DIAGNOSIS — Z7951 Long term (current) use of inhaled steroids: Secondary | ICD-10-CM | POA: Insufficient documentation

## 2018-07-23 DIAGNOSIS — S31104A Unspecified open wound of abdominal wall, left lower quadrant without penetration into peritoneal cavity, initial encounter: Secondary | ICD-10-CM | POA: Diagnosis not present

## 2018-07-23 DIAGNOSIS — E1140 Type 2 diabetes mellitus with diabetic neuropathy, unspecified: Secondary | ICD-10-CM | POA: Diagnosis not present

## 2018-07-23 DIAGNOSIS — S81802A Unspecified open wound, left lower leg, initial encounter: Secondary | ICD-10-CM | POA: Diagnosis present

## 2018-07-23 DIAGNOSIS — D649 Anemia, unspecified: Secondary | ICD-10-CM | POA: Insufficient documentation

## 2018-07-23 DIAGNOSIS — R011 Cardiac murmur, unspecified: Secondary | ICD-10-CM | POA: Insufficient documentation

## 2018-07-23 DIAGNOSIS — L8990 Pressure ulcer of unspecified site, unspecified stage: Secondary | ICD-10-CM

## 2018-07-23 DIAGNOSIS — Z981 Arthrodesis status: Secondary | ICD-10-CM | POA: Insufficient documentation

## 2018-07-23 DIAGNOSIS — Z833 Family history of diabetes mellitus: Secondary | ICD-10-CM | POA: Insufficient documentation

## 2018-07-23 DIAGNOSIS — Z888 Allergy status to other drugs, medicaments and biological substances status: Secondary | ICD-10-CM | POA: Diagnosis not present

## 2018-07-23 DIAGNOSIS — Z6841 Body Mass Index (BMI) 40.0 and over, adult: Secondary | ICD-10-CM | POA: Insufficient documentation

## 2018-07-23 DIAGNOSIS — J45909 Unspecified asthma, uncomplicated: Secondary | ICD-10-CM | POA: Insufficient documentation

## 2018-07-23 DIAGNOSIS — M1990 Unspecified osteoarthritis, unspecified site: Secondary | ICD-10-CM | POA: Diagnosis not present

## 2018-07-23 DIAGNOSIS — K219 Gastro-esophageal reflux disease without esophagitis: Secondary | ICD-10-CM | POA: Insufficient documentation

## 2018-07-23 DIAGNOSIS — K449 Diaphragmatic hernia without obstruction or gangrene: Secondary | ICD-10-CM | POA: Diagnosis not present

## 2018-07-23 DIAGNOSIS — F329 Major depressive disorder, single episode, unspecified: Secondary | ICD-10-CM | POA: Diagnosis not present

## 2018-07-23 DIAGNOSIS — Z8379 Family history of other diseases of the digestive system: Secondary | ICD-10-CM | POA: Insufficient documentation

## 2018-07-23 DIAGNOSIS — E1151 Type 2 diabetes mellitus with diabetic peripheral angiopathy without gangrene: Secondary | ICD-10-CM | POA: Diagnosis not present

## 2018-07-23 DIAGNOSIS — T8149XA Infection following a procedure, other surgical site, initial encounter: Secondary | ICD-10-CM | POA: Insufficient documentation

## 2018-07-23 DIAGNOSIS — I350 Nonrheumatic aortic (valve) stenosis: Secondary | ICD-10-CM | POA: Diagnosis not present

## 2018-07-23 DIAGNOSIS — Z820 Family history of epilepsy and other diseases of the nervous system: Secondary | ICD-10-CM | POA: Insufficient documentation

## 2018-07-23 DIAGNOSIS — Z818 Family history of other mental and behavioral disorders: Secondary | ICD-10-CM | POA: Insufficient documentation

## 2018-07-23 DIAGNOSIS — Z823 Family history of stroke: Secondary | ICD-10-CM | POA: Insufficient documentation

## 2018-07-23 DIAGNOSIS — I739 Peripheral vascular disease, unspecified: Secondary | ICD-10-CM | POA: Insufficient documentation

## 2018-07-23 HISTORY — PX: WOUND DEBRIDEMENT: SHX247

## 2018-07-23 HISTORY — PX: APPLICATION OF WOUND VAC: SHX5189

## 2018-07-23 LAB — CBC WITH DIFFERENTIAL/PLATELET
Abs Immature Granulocytes: 0.04 10*3/uL (ref 0.00–0.07)
Basophils Absolute: 0.1 10*3/uL (ref 0.0–0.1)
Basophils Relative: 1 %
Eosinophils Absolute: 0.2 10*3/uL (ref 0.0–0.5)
Eosinophils Relative: 2 %
HCT: 33.4 % — ABNORMAL LOW (ref 36.0–46.0)
Hemoglobin: 10.5 g/dL — ABNORMAL LOW (ref 12.0–15.0)
Immature Granulocytes: 1 %
Lymphocytes Relative: 14 %
Lymphs Abs: 1.2 10*3/uL (ref 0.7–4.0)
MCH: 26.7 pg (ref 26.0–34.0)
MCHC: 31.4 g/dL (ref 30.0–36.0)
MCV: 85 fL (ref 80.0–100.0)
Monocytes Absolute: 0.5 10*3/uL (ref 0.1–1.0)
Monocytes Relative: 6 %
Neutro Abs: 6.6 10*3/uL (ref 1.7–7.7)
Neutrophils Relative %: 76 %
Platelets: 331 10*3/uL (ref 150–400)
RBC: 3.93 MIL/uL (ref 3.87–5.11)
RDW: 13.2 % (ref 11.5–15.5)
WBC: 8.6 10*3/uL (ref 4.0–10.5)
nRBC: 0 % (ref 0.0–0.2)

## 2018-07-23 LAB — URINE DRUG SCREEN, QUALITATIVE (ARMC ONLY)
Amphetamines, Ur Screen: NOT DETECTED
Barbiturates, Ur Screen: NOT DETECTED
Benzodiazepine, Ur Scrn: NOT DETECTED
Cannabinoid 50 Ng, Ur ~~LOC~~: NOT DETECTED
Cocaine Metabolite,Ur ~~LOC~~: NOT DETECTED
MDMA (Ecstasy)Ur Screen: NOT DETECTED
Methadone Scn, Ur: NOT DETECTED
Opiate, Ur Screen: POSITIVE — AB
Phencyclidine (PCP) Ur S: NOT DETECTED
Tricyclic, Ur Screen: NOT DETECTED

## 2018-07-23 LAB — BASIC METABOLIC PANEL
Anion gap: 9 (ref 5–15)
BUN: 19 mg/dL (ref 8–23)
CO2: 25 mmol/L (ref 22–32)
Calcium: 9.4 mg/dL (ref 8.9–10.3)
Chloride: 102 mmol/L (ref 98–111)
Creatinine, Ser: 0.94 mg/dL (ref 0.44–1.00)
GFR calc Af Amer: >60 mL/min (ref >60)
GFR calc non Af Amer: >60 mL/min (ref >60)
Glucose, Bld: 125 mg/dL — ABNORMAL HIGH (ref 70–99)
Potassium: 4.9 mmol/L (ref 3.5–5.1)
Sodium: 136 mmol/L (ref 135–145)

## 2018-07-23 LAB — PROTIME-INR
INR: 1.03
Prothrombin Time: 13.4 s (ref 11.4–15.2)

## 2018-07-23 LAB — GLUCOSE, CAPILLARY
Glucose-Capillary: 130 mg/dL — ABNORMAL HIGH (ref 70–99)
Glucose-Capillary: 108 mg/dL — ABNORMAL HIGH (ref 70–99)

## 2018-07-23 LAB — TYPE AND SCREEN
ABO/RH(D): O POS
Antibody Screen: NEGATIVE

## 2018-07-23 LAB — APTT: aPTT: 33 s (ref 24–36)

## 2018-07-23 SURGERY — DEBRIDEMENT, WOUND
Anesthesia: General | Laterality: Left

## 2018-07-23 MED ORDER — ALBUTEROL SULFATE (2.5 MG/3ML) 0.083% IN NEBU: 2.5000 mg | INHALATION_SOLUTION | Freq: Four times a day (QID) | RESPIRATORY_TRACT | Status: DC | PRN

## 2018-07-23 MED ORDER — GLYCOPYRROLATE 0.2 MG/ML IJ SOLN
INTRAMUSCULAR | Status: AC
  Filled 2018-07-23: qty 1

## 2018-07-23 MED ORDER — ACETAMINOPHEN 325 MG PO TABS: 650.0000 mg | ORAL_TABLET | Freq: Four times a day (QID) | ORAL | Status: DC | PRN

## 2018-07-23 MED ORDER — ATORVASTATIN CALCIUM 10 MG PO TABS
10.0000 mg | ORAL_TABLET | Freq: Every day | ORAL | Status: DC
  Administered 2018-07-23: 10 mg via ORAL
  Filled 2018-07-23: qty 1

## 2018-07-23 MED ORDER — ACETAMINOPHEN 325 MG PO TABS: 325.0000 mg | ORAL_TABLET | Freq: Four times a day (QID) | ORAL | Status: DC | PRN

## 2018-07-23 MED ORDER — FLUTICASONE PROPIONATE 50 MCG/ACT NA SUSP
2.0000 | Freq: Every day | NASAL | Status: DC
  Administered 2018-07-24: 2 via NASAL
  Filled 2018-07-23: qty 16

## 2018-07-23 MED ORDER — GLYCOPYRROLATE 0.2 MG/ML IJ SOLN
INTRAMUSCULAR | Status: DC | PRN
  Administered 2018-07-23: 0.2 mg via INTRAVENOUS

## 2018-07-23 MED ORDER — CALCIUM CARBONATE ANTACID 500 MG PO CHEW
3.0000 | CHEWABLE_TABLET | Freq: Every day | ORAL | Status: DC
  Administered 2018-07-24: 600 mg via ORAL
  Filled 2018-07-23: qty 3

## 2018-07-23 MED ORDER — DEXAMETHASONE SODIUM PHOSPHATE 10 MG/ML IJ SOLN
INTRAMUSCULAR | Status: DC | PRN
  Administered 2018-07-23: 5 mg via INTRAVENOUS

## 2018-07-23 MED ORDER — DEXTROSE-NACL 5-0.9 % IV SOLN: INTRAVENOUS | Status: DC

## 2018-07-23 MED ORDER — MELATONIN 5 MG PO TABS
1.0000 | ORAL_TABLET | Freq: Every day | ORAL | Status: DC
  Administered 2018-07-23: 5 mg via ORAL
  Filled 2018-07-23 (×2): qty 1

## 2018-07-23 MED ORDER — SORBITOL 70 % SOLN
30.0000 mL | Freq: Every day | Status: DC | PRN
  Filled 2018-07-23: qty 30

## 2018-07-23 MED ORDER — CEFAZOLIN SODIUM-DEXTROSE 2-4 GM/100ML-% IV SOLN
INTRAVENOUS | Status: AC
  Filled 2018-07-23: qty 100

## 2018-07-23 MED ORDER — SERTRALINE HCL 50 MG PO TABS
100.0000 mg | ORAL_TABLET | Freq: Every day | ORAL | Status: DC
  Administered 2018-07-24: 100 mg via ORAL
  Filled 2018-07-23: qty 2

## 2018-07-23 MED ORDER — HYDROCODONE-ACETAMINOPHEN 5-325 MG PO TABS
1.0000 | ORAL_TABLET | Freq: Four times a day (QID) | ORAL | Status: DC | PRN
  Administered 2018-07-23: 1 via ORAL
  Filled 2018-07-23: qty 1

## 2018-07-23 MED ORDER — FENTANYL CITRATE (PF) 100 MCG/2ML IJ SOLN
INTRAMUSCULAR | Status: AC
  Administered 2018-07-23: 25 ug via INTRAVENOUS
  Filled 2018-07-23: qty 2

## 2018-07-23 MED ORDER — CLOPIDOGREL BISULFATE 75 MG PO TABS
75.0000 mg | ORAL_TABLET | Freq: Every day | ORAL | Status: DC
  Administered 2018-07-23 – 2018-07-24 (×2): 75 mg via ORAL
  Filled 2018-07-23 (×2): qty 1

## 2018-07-23 MED ORDER — CHLORHEXIDINE GLUCONATE CLOTH 2 % EX PADS: 6.0000 | MEDICATED_PAD | Freq: Once | CUTANEOUS | Status: DC

## 2018-07-23 MED ORDER — CHLORHEXIDINE GLUCONATE CLOTH 2 % EX PADS
6.0000 | MEDICATED_PAD | Freq: Once | CUTANEOUS | Status: AC
  Administered 2018-07-23: 6 via TOPICAL

## 2018-07-23 MED ORDER — ONDANSETRON HCL 4 MG/2ML IJ SOLN: 4.0000 mg | Freq: Once | INTRAMUSCULAR | Status: DC | PRN

## 2018-07-23 MED ORDER — ACETAMINOPHEN 650 MG RE SUPP: 650.0000 mg | Freq: Four times a day (QID) | RECTAL | Status: DC | PRN

## 2018-07-23 MED ORDER — MAGNESIUM HYDROXIDE 400 MG/5ML PO SUSP
30.0000 mL | Freq: Every day | ORAL | Status: DC | PRN
  Filled 2018-07-23: qty 30

## 2018-07-23 MED ORDER — FENTANYL CITRATE (PF) 100 MCG/2ML IJ SOLN
INTRAMUSCULAR | Status: DC | PRN
  Administered 2018-07-23: 50 ug via INTRAVENOUS

## 2018-07-23 MED ORDER — MORPHINE SULFATE (PF) 2 MG/ML IV SOLN: 2.0000 mg | INTRAVENOUS | Status: DC | PRN

## 2018-07-23 MED ORDER — FENTANYL CITRATE (PF) 100 MCG/2ML IJ SOLN
INTRAMUSCULAR | Status: AC
  Filled 2018-07-23: qty 2

## 2018-07-23 MED ORDER — FLEET ENEMA 7-19 GM/118ML RE ENEM: 1.0000 | ENEMA | Freq: Once | RECTAL | Status: DC | PRN

## 2018-07-23 MED ORDER — VITAMIN C 500 MG PO TABS: 500.0000 mg | ORAL_TABLET | ORAL | Status: DC

## 2018-07-23 MED ORDER — FENTANYL CITRATE (PF) 100 MCG/2ML IJ SOLN
25.0000 ug | INTRAMUSCULAR | Status: AC | PRN
  Administered 2018-07-23 (×6): 25 ug via INTRAVENOUS

## 2018-07-23 MED ORDER — GABAPENTIN 400 MG PO CAPS
400.0000 mg | ORAL_CAPSULE | Freq: Two times a day (BID) | ORAL | Status: DC
  Administered 2018-07-23 – 2018-07-24 (×2): 400 mg via ORAL
  Filled 2018-07-23 (×2): qty 1

## 2018-07-23 MED ORDER — ONDANSETRON HCL 4 MG PO TABS: 4.0000 mg | ORAL_TABLET | Freq: Four times a day (QID) | ORAL | Status: DC | PRN

## 2018-07-23 MED ORDER — EPHEDRINE SULFATE 50 MG/ML IJ SOLN
INTRAMUSCULAR | Status: AC
  Filled 2018-07-23: qty 1

## 2018-07-23 MED ORDER — COENZYME Q10 30 MG PO CAPS: 100.0000 mg | ORAL_CAPSULE | Freq: Every day | ORAL | Status: DC

## 2018-07-23 MED ORDER — HYDROCODONE-ACETAMINOPHEN 5-325 MG PO TABS: 0.5000 | ORAL_TABLET | Freq: Four times a day (QID) | ORAL | Status: DC | PRN

## 2018-07-23 MED ORDER — FAMOTIDINE 20 MG PO TABS: 20.0000 mg | ORAL_TABLET | Freq: Two times a day (BID) | ORAL | Status: DC | PRN

## 2018-07-23 MED ORDER — LIDOCAINE HCL (PF) 2 % IJ SOLN
INTRAMUSCULAR | Status: AC
  Filled 2018-07-23: qty 10

## 2018-07-23 MED ORDER — LISINOPRIL 10 MG PO TABS
10.0000 mg | ORAL_TABLET | Freq: Every day | ORAL | Status: DC
  Administered 2018-07-24: 10 mg via ORAL
  Filled 2018-07-23: qty 1

## 2018-07-23 MED ORDER — HYDROCODONE-ACETAMINOPHEN 5-325 MG PO TABS: 1.0000 | ORAL_TABLET | Freq: Four times a day (QID) | ORAL | Status: DC | PRN

## 2018-07-23 MED ORDER — ADULT MULTIVITAMIN W/MINERALS CH
1.0000 | ORAL_TABLET | Freq: Every day | ORAL | Status: DC
  Administered 2018-07-24: 1 via ORAL
  Filled 2018-07-23: qty 1

## 2018-07-23 MED ORDER — PROPOFOL 10 MG/ML IV BOLUS
INTRAVENOUS | Status: DC | PRN
  Administered 2018-07-23: 130 mg via INTRAVENOUS

## 2018-07-23 MED ORDER — SODIUM CHLORIDE 0.9 % IV SOLN
INTRAVENOUS | Status: DC
  Administered 2018-07-23: 13:00:00 via INTRAVENOUS

## 2018-07-23 MED ORDER — VARENICLINE TARTRATE 1 MG PO TABS
1.0000 mg | ORAL_TABLET | Freq: Two times a day (BID) | ORAL | Status: DC
  Administered 2018-07-23: 1 mg via ORAL
  Filled 2018-07-23 (×3): qty 1

## 2018-07-23 MED ORDER — ONDANSETRON HCL 4 MG/2ML IJ SOLN: 4.0000 mg | Freq: Four times a day (QID) | INTRAMUSCULAR | Status: DC | PRN

## 2018-07-23 MED ORDER — DOCUSATE SODIUM 100 MG PO CAPS
100.0000 mg | ORAL_CAPSULE | Freq: Two times a day (BID) | ORAL | Status: DC
  Administered 2018-07-23 – 2018-07-24 (×2): 100 mg via ORAL
  Filled 2018-07-23 (×2): qty 1

## 2018-07-23 MED ORDER — ONDANSETRON HCL 4 MG/2ML IJ SOLN
INTRAMUSCULAR | Status: DC | PRN
  Administered 2018-07-23: 4 mg via INTRAVENOUS

## 2018-07-23 MED ORDER — PROPOFOL 10 MG/ML IV BOLUS
INTRAVENOUS | Status: AC
  Filled 2018-07-23: qty 20

## 2018-07-23 SURGICAL SUPPLY — 41 items
BRUSH SCRUB EZ  4% CHG (MISCELLANEOUS) ×2
BRUSH SCRUB EZ 4% CHG (MISCELLANEOUS) ×1 IMPLANT
CANISTER SUCT 1200ML W/VALVE (MISCELLANEOUS) ×3 IMPLANT
CHLORAPREP W/TINT 26ML (MISCELLANEOUS) ×3 IMPLANT
COVER WAND RF STERILE (DRAPES) ×3 IMPLANT
DRAPE INCISE IOBAN 66X45 STRL (DRAPES) ×3 IMPLANT
DRAPE LAPAROTOMY 100X77 ABD (DRAPES) ×3 IMPLANT
DRSG VAC ATS MED SENSATRAC (GAUZE/BANDAGES/DRESSINGS) IMPLANT
ELECT CAUTERY BLADE 6.4 (BLADE) ×3 IMPLANT
ELECT REM PT RETURN 9FT ADLT (ELECTROSURGICAL) ×3
ELECTRODE REM PT RTRN 9FT ADLT (ELECTROSURGICAL) ×1 IMPLANT
GLOVE BIO SURGEON STRL SZ7 (GLOVE) ×3 IMPLANT
GOWN STRL REUS W/ TWL LRG LVL3 (GOWN DISPOSABLE) ×1 IMPLANT
GOWN STRL REUS W/ TWL XL LVL3 (GOWN DISPOSABLE) ×1 IMPLANT
GOWN STRL REUS W/TWL LRG LVL3 (GOWN DISPOSABLE) ×2
GOWN STRL REUS W/TWL XL LVL3 (GOWN DISPOSABLE) ×2
HANDPIECE INTERPULSE COAX TIP (DISPOSABLE) ×2
IV NS 1000ML (IV SOLUTION) ×2
IV NS 1000ML BAXH (IV SOLUTION) ×1 IMPLANT
KIT DRSG VAC SLVR GRANUFM (MISCELLANEOUS) ×3 IMPLANT
KIT TURNOVER KIT A (KITS) ×3 IMPLANT
LABEL OR SOLS (LABEL) ×3 IMPLANT
NS IRRIG 1000ML POUR BTL (IV SOLUTION) ×3 IMPLANT
NS IRRIG 500ML POUR BTL (IV SOLUTION) ×3 IMPLANT
PACK BASIN MINOR ARMC (MISCELLANEOUS) ×3 IMPLANT
PACK EXTREMITY ARMC (MISCELLANEOUS) ×3 IMPLANT
PAD PREP 24X41 OB/GYN DISP (PERSONAL CARE ITEMS) ×3 IMPLANT
PULSAVAC PLUS IRRIG FAN TIP (DISPOSABLE) ×3
SET HNDPC FAN SPRY TIP SCT (DISPOSABLE) ×1 IMPLANT
SOL PREP PVP 2OZ (MISCELLANEOUS) ×3
SOLUTION PREP PVP 2OZ (MISCELLANEOUS) ×1 IMPLANT
SPONGE LAP 18X18 RF (DISPOSABLE) ×3 IMPLANT
SUT ETHILON 4-0 (SUTURE) ×2
SUT ETHILON 4-0 FS2 18XMFL BLK (SUTURE) ×1
SUT VIC AB 3-0 SH 27 (SUTURE) ×4
SUT VIC AB 3-0 SH 27X BRD (SUTURE) ×2 IMPLANT
SUTURE ETHLN 4-0 FS2 18XMF BLK (SUTURE) ×1 IMPLANT
SWAB CULTURE AMIES ANAERIB BLU (MISCELLANEOUS) ×3 IMPLANT
SYR BULB 3OZ (MISCELLANEOUS) IMPLANT
TIP FAN IRRIG PULSAVAC PLUS (DISPOSABLE) ×1 IMPLANT
WND VAC CANISTER 500ML (MISCELLANEOUS) ×3 IMPLANT

## 2018-07-23 NOTE — Patient Outreach (Signed)
[facility]) Care Management  07/23/2018  Carlee J Shimabukuro 1945-08-23 [id number]   EMMI-GENERAL DISCHARGE  RED ON EMMI ALERT Day #1 Date:07/12/18 10:52 AM Red Alert Reason:"Transportation to follow-up? No", "Unfilled prescriptions? Yes", "Wounds healing well? No"  UPDATE: Noted patient has been readmitted today by Dr. Annice Needy, MD.  Dx: Surgical Site Infection with left wound debridement.   Plan:  RN CM will close case due to patient readmission.   [name], RN,CCM [facility] Care Management Care Management Coordinator Direct Phone: [phone] Toll Free: [phone] Fax: [phone]

## 2018-07-23 NOTE — Transfer of Care (Signed)
Immediate Anesthesia Transfer of Care Note  Patient: Andrea Holden  Procedure(s) Performed: DEBRIDEMENT WOUND (Left ) APPLICATION OF WOUND VAC (Left )  Patient Location: PACU  Anesthesia Type:General  Level of Consciousness: drowsy and patient cooperative  Airway & Oxygen Therapy: Patient Spontanous Breathing and Patient connected to face mask oxygen  Post-op Assessment: Report given to RN and Post -op Vital signs reviewed and stable  Post vital signs: Reviewed and stable  Last Vitals:  Vitals Value Taken Time  BP 147/56 07/23/2018  2:36 PM  Temp 36.6 C 07/23/2018  2:36 PM  Pulse 78 07/23/2018  2:36 PM  Resp 12 07/23/2018  2:36 PM  SpO2 100 % 07/23/2018  2:36 PM    Last Pain:  Vitals:   07/23/18 1257  TempSrc: Temporal  PainSc: 0-No pain         Complications: No apparent anesthesia complications

## 2018-07-23 NOTE — Op Note (Signed)
    OPERATIVE NOTE   PROCEDURE: 1. Irrigation and debridement of left groin wound to the skin, soft tissue, and muscle for approximately 50 cm with pulse lavage and negative pressure dressing placement  PRE-OPERATIVE DIAGNOSIS: Nonviable tissue and infection of left femoral wound  POST-OPERATIVE DIAGNOSIS: Same as above  SURGEON: Festus Barren, MD  ASSISTANT(S): Raul Del, PA-C  ANESTHESIA: general  ESTIMATED BLOOD LOSS: 10 cc  FINDING(S): None  SPECIMEN(S):  Swab sent for culture  INDICATIONS:   Andrea Holden is a 73 y.o. female who presents with fibrinous exudate, malodorous drainage from her left groin wound with partial wound opening.  She had a left femoral endarterectomy a few weeks ago.  She is being brought in to clean up the wound and get a negative pressure dressing to get the wound under control.  Risks and benefits are discussed and informed consent is obtained. An assistant was present during the procedure to help facilitate the exposure and expedite the procedure.  DESCRIPTION: After obtaining full informed written consent, the patient was brought back to the operating room and placed supine upon the operating table.  The patient received IV antibiotics prior to induction.  After obtaining adequate anesthesia, the patient was prepped and draped in the standard fashion. The assistant provided retraction and mobilization to help facilitate exposure and expedite the procedure throughout the entire procedure.  This included following suture, using retractors, and optimizing lighting. The wound was then opened and excisional debridement was performed to the skin, soft tissue and muscle to remove all clearly non-viable tissue.  The tissue was taken back to bleeding tissue that appeared viable.  The debridement was performed with scissors and we used gauze and the bovie scratch pad to remove as much fibrinous exudate and get down to pink decent tissue and encompassed an area  of approximately 50 cm2.  The wound was approximately 12 cm in its longest length and 5 cm in its widest width as well as being about 2 cm in depth at its deepest portion.  The wound was irrigated copiously with pulse lavage saline.  After all clearly non-viable tissue was removed, a silver VAC sponge was cut to fit the wound.  Strips of [place] were used for an occlusive seal and suction tubing was placed with a good seal obtained. The patient was then awakened from anesthesia and taken to the recovery room in stable condition having tolerated the procedure well.  COMPLICATIONS: none  CONDITION: stable  Festus Barren  07/23/2018, 2:38 PM   This note was created with Dragon Medical transcription system. Any errors in dictation are purely unintentional.

## 2018-07-23 NOTE — Progress Notes (Signed)

## 2018-07-23 NOTE — H&P (Signed)
[facility] History & Physical Update  The patient was interviewed and re-examined.  The patient's previous History and Physical has been reviewed and is unchanged.  There is no change in the plan of care. We plan to proceed with the scheduled procedure.  Festus Barren, MD  07/23/2018, 12:28 PM

## 2018-07-23 NOTE — Anesthesia Post-op Follow-up Note (Signed)
Anesthesia QCDR form completed.        

## 2018-07-23 NOTE — Anesthesia Preprocedure Evaluation (Signed)
Anesthesia Evaluation  Patient identified by MRN, date of birth, ID band Patient awake    Reviewed: Allergy & Precautions, NPO status , Patient's Chart, lab work & pertinent test results, reviewed documented beta blocker date and time   History of Anesthesia Complications (+) history of anesthetic complications  Airway Mallampati: III  TM Distance: >3 FB     Dental  (+) Chipped   Pulmonary shortness of breath, asthma , former smoker,           Cardiovascular hypertension, Pt. on medications + angina + CAD and + Peripheral Vascular Disease  + dysrhythmias + Valvular Problems/Murmurs      Neuro/Psych PSYCHIATRIC DISORDERS Anxiety Depression    GI/Hepatic hiatal hernia, PUD, GERD  Controlled,  Endo/Other  diabetes, Type obesity  Renal/GU      Musculoskeletal  (+) Arthritis ,   Abdominal   Peds  Hematology  (+) anemia ,   Anesthesia Other Findings Gastric bypass. Smokes. Hb 10.9. On plavix.  Reproductive/Obstetrics                             Anesthesia Physical Anesthesia Plan  ASA: III  Anesthesia Plan: General   Post-op Pain Management:    Induction: Intravenous  PONV Risk Score and Plan:   Airway Management Planned: LMA  Additional Equipment:   Intra-op Plan:   Post-operative Plan:   Informed Consent: I have reviewed the patients History and Physical, chart, labs and discussed the procedure including the risks, benefits and alternatives for the proposed anesthesia with the patient or authorized representative who has indicated his/her understanding and acceptance.       Plan Discussed with: CRNA  Anesthesia Plan Comments:         Anesthesia Quick Evaluation

## 2018-07-23 NOTE — Anesthesia Procedure Notes (Signed)
Procedure Name: LMA Insertion Date/Time: 07/23/2018 1:45 PM Performed by: Omer Jack, CRNA Pre-anesthesia Checklist: Patient identified, Patient being monitored, Timeout performed, Emergency Drugs available and Suction available Patient Re-evaluated:Patient Re-evaluated prior to induction Oxygen Delivery Method: Circle system utilized Preoxygenation: Pre-oxygenation with 100% oxygen Induction Type: IV induction Ventilation: Mask ventilation without difficulty LMA: LMA inserted LMA Size: 3.5 Tube type: Oral Number of attempts: 1 Placement Confirmation: positive ETCO2 and breath sounds checked- equal and bilateral Tube secured with: Tape Dental Injury: Teeth and Oropharynx as per pre-operative assessment

## 2018-07-24 ENCOUNTER — Encounter: Payer: Self-pay | Admitting: Vascular Surgery

## 2018-07-24 ENCOUNTER — Ambulatory Visit: Payer: Medicare HMO | Admitting: Family Medicine

## 2018-07-24 DIAGNOSIS — T8149XA Infection following a procedure, other surgical site, initial encounter: Secondary | ICD-10-CM

## 2018-07-24 DIAGNOSIS — T8141XA Infection following a procedure, superficial incisional surgical site, initial encounter: Secondary | ICD-10-CM | POA: Diagnosis not present

## 2018-07-24 DIAGNOSIS — M1990 Unspecified osteoarthritis, unspecified site: Secondary | ICD-10-CM | POA: Diagnosis not present

## 2018-07-24 DIAGNOSIS — R011 Cardiac murmur, unspecified: Secondary | ICD-10-CM | POA: Diagnosis not present

## 2018-07-24 DIAGNOSIS — F419 Anxiety disorder, unspecified: Secondary | ICD-10-CM | POA: Diagnosis not present

## 2018-07-24 DIAGNOSIS — I350 Nonrheumatic aortic (valve) stenosis: Secondary | ICD-10-CM | POA: Diagnosis not present

## 2018-07-24 DIAGNOSIS — L8990 Pressure ulcer of unspecified site, unspecified stage: Secondary | ICD-10-CM

## 2018-07-24 DIAGNOSIS — J45909 Unspecified asthma, uncomplicated: Secondary | ICD-10-CM | POA: Diagnosis not present

## 2018-07-24 DIAGNOSIS — M5136 Other intervertebral disc degeneration, lumbar region: Secondary | ICD-10-CM | POA: Diagnosis not present

## 2018-07-24 DIAGNOSIS — D689 Coagulation defect, unspecified: Secondary | ICD-10-CM | POA: Diagnosis not present

## 2018-07-24 DIAGNOSIS — B9689 Other specified bacterial agents as the cause of diseases classified elsewhere: Secondary | ICD-10-CM | POA: Diagnosis not present

## 2018-07-24 DIAGNOSIS — S31104A Unspecified open wound of abdominal wall, left lower quadrant without penetration into peritoneal cavity, initial encounter: Secondary | ICD-10-CM

## 2018-07-24 LAB — CBC
HCT: 32 % — ABNORMAL LOW (ref 36.0–46.0)
Hemoglobin: 10.1 g/dL — ABNORMAL LOW (ref 12.0–15.0)
MCH: 27.3 pg (ref 26.0–34.0)
MCHC: 31.6 g/dL (ref 30.0–36.0)
MCV: 86.5 fL (ref 80.0–100.0)
Platelets: 289 10*3/uL (ref 150–400)
RBC: 3.7 MIL/uL — ABNORMAL LOW (ref 3.87–5.11)
RDW: 13.1 % (ref 11.5–15.5)
WBC: 11.3 10*3/uL — ABNORMAL HIGH (ref 4.0–10.5)
nRBC: 0 % (ref 0.0–0.2)

## 2018-07-24 LAB — BASIC METABOLIC PANEL
Anion gap: 4 — ABNORMAL LOW (ref 5–15)
BUN: 23 mg/dL (ref 8–23)
CO2: 28 mmol/L (ref 22–32)
Calcium: 8.4 mg/dL — ABNORMAL LOW (ref 8.9–10.3)
Chloride: 102 mmol/L (ref 98–111)
Creatinine, Ser: 0.91 mg/dL (ref 0.44–1.00)
GFR calc Af Amer: >60 mL/min (ref >60)
GFR calc non Af Amer: >60 mL/min (ref >60)
Glucose, Bld: 183 mg/dL — ABNORMAL HIGH (ref 70–99)
Potassium: 5.6 mmol/L — ABNORMAL HIGH (ref 3.5–5.1)
Sodium: 134 mmol/L — ABNORMAL LOW (ref 135–145)

## 2018-07-24 LAB — MAGNESIUM: Magnesium: 1.9 mg/dL (ref 1.7–2.4)

## 2018-07-24 MED ORDER — AMOXICILLIN-POT CLAVULANATE 875-125 MG PO TABS: 1.0000 | ORAL_TABLET | Freq: Two times a day (BID) | ORAL | Status: DC

## 2018-07-24 MED ORDER — MENTHOL 3 MG MT LOZG
1.0000 | LOZENGE | OROMUCOSAL | Status: DC | PRN
  Administered 2018-07-24: 3 mg via ORAL
  Filled 2018-07-24: qty 9

## 2018-07-24 MED ORDER — HYDROCODONE-ACETAMINOPHEN 5-325 MG PO TABS: 0.5000 | ORAL_TABLET | Freq: Four times a day (QID) | ORAL | 0 refills | Status: DC | PRN

## 2018-07-24 MED ORDER — AMOXICILLIN-POT CLAVULANATE 875-125 MG PO TABS: 1.0000 | ORAL_TABLET | Freq: Two times a day (BID) | ORAL | 0 refills | Status: DC

## 2018-07-24 MED ORDER — MORPHINE SULFATE (PF) 2 MG/ML IV SOLN
0.5000 mg | INTRAVENOUS | Status: AC
  Administered 2018-07-24: 0.5 mg via INTRAVENOUS
  Filled 2018-07-24: qty 1

## 2018-07-24 NOTE — Care Management Obs Status (Signed)
MEDICARE OBSERVATION STATUS NOTIFICATION   Patient Details  Name: Andrea Holden MRN: [id number] Date of Birth: 11/09/1945   Medicare Observation Status Notification Given:  No(Admitted obs less thand 24 hours)    Chapman Fitch, RN 07/24/2018, 11:43 AM

## 2018-07-24 NOTE — Discharge Summary (Addendum)
[facility]    Discharge Summary   Patient ID:  Andrea MangleDianna J Holden MRN: [id number] DOB/AGE: 1945/07/03 73 y.o.  Admit date: 07/23/2018 Discharge date: 07/24/2018 Date of Surgery: 07/23/2018 Surgeon: Surgeon(s): Wyn Quakerew, Marlow BaarsJason S, MD  Admission Diagnosis: SURGICAL SITE INFECTION  Discharge Diagnoses:  SURGICAL SITE INFECTION  Secondary Diagnoses: Past Medical History:  Diagnosis Date  . Allergy    seasonal allergies  . Anemia 2014   needed 5 units of blood d/t passing out, weak  . Anxiety   . Aortic stenosis 12/24/2017   Echo Aug 2018  . Arthritis   . Asthma    allergy induced asthma  . Cardiac murmur 12/12/2017  . Cataract   . Cataract    left  . Complication of anesthesia    arrhythmia following colonoscopy  . Degenerative disc disease, lumbar   . Degenerative disc disease, lumbar   . Depression   . Diabetes mellitus   . Diabetic neuropathy (HCC) 11/16/2015  . Dysrhythmia    stenosis of left ventricle  . GERD (gastroesophageal reflux disease)   . H/O transfusion    patient [facility], blood type O+  . History of chicken pox   . History of hiatal hernia   . History of measles, mumps, or rubella   . HOH (hard of hearing)    does not use hearing aides yet  . Hyperlipidemia   . Hypertension   . Irregular heartbeat   . LVH (left ventricular hypertrophy) 12/24/2017   Echo Aug 2018  . Neuropathy   . Opiate use 11/16/2015  . Peripheral arterial disease (HCC) 05/06/2018   At rest, left > right; refer to vasc  . Pes planus of both feet 11/16/2015  . Plantar fasciitis of right foot 11/16/2015  . Wheezing    Procedure(s): DEBRIDEMENT WOUND APPLICATION OF WOUND VAC  Discharged Condition: good  HPI:  The patient is a 73 year old female with multiple medical issues including a recent left femoral endarterectomy.  Patient presents with breakdown of that wound requiring surgical intervention.  On July 23, 2018,  the patient underwent irrigation and debridement of left groin wound to the skin, soft tissue, and muscle for approximately 50cm with pulse lavage and negative pressure dressing placement.  The patient tolerated the procedure well was transferred to the surgical floor without issue.  The patient's night of surgery was unremarkable.  During the patient's brief operative stay, her diet was advanced her pain was controlled to the use of p.o. pain medication she was urinating independently and ambulating without complication.  A [employer] VAC was placed in the operating room however the patient's insurance only covered [employer].  The patient did not have any [employer] supplies so that an [employer] VAC could be placed in OR. The [employer] VAC was removed before discharged and an [employer] VAC was placed when the patient received supplies.   Visiting nursing services have been continued for wound care.  Hospital Course:  Andrea Holden is a 73 y.o. female is S/P Left:  Procedure(s): DEBRIDEMENT WOUND APPLICATION OF WOUND VAC  Extubated: POD # 0  Physical exam:  A&Ox3, NAD CV: RRR Pulm: CTA bilaterally Abdomen: Soft, Non-tender, Non-distended, (+) Bowel Sounds Left Groin: OR VAC removed.  Wound with 100% granulation tissue noted.  Healthy.  Skin edges are healthy.  No drainage.  No foul odor.  Wet-to-dry dressing placed.  Post-op wounds clean, dry, intact or healing well  Pt. Ambulating, voiding  and taking PO diet without difficulty.  Pt pain controlled with PO pain meds.  Labs as below  Complications:none  Consults: None   Significant Diagnostic Studies: CBC Lab Results  Component Value Date   WBC 11.3 (H) 07/24/2018   HGB 10.1 (L) 07/24/2018   HCT 32.0 (L) 07/24/2018   MCV 86.5 07/24/2018   PLT 289 07/24/2018   BMET    Component Value Date/Time   NA 134 (L) 07/24/2018 0534   NA 133 (L) 03/07/2016 1317   NA 135 10/12/2014 0516   K 5.6 (H) 07/24/2018 0534   K 4.3 10/12/2014 0516   CL 102  07/24/2018 0534   CL 109 10/12/2014 0516   CO2 28 07/24/2018 0534   CO2 24 10/12/2014 0516   GLUCOSE 183 (H) 07/24/2018 0534   GLUCOSE 146 (H) 10/12/2014 0516   BUN 23 07/24/2018 0534   BUN 18 03/07/2016 1317   BUN 39 (H) 10/12/2014 0516   CREATININE 0.91 07/24/2018 0534   CREATININE 0.85 04/23/2018 1204   CALCIUM 8.4 (L) 07/24/2018 0534   CALCIUM 7.6 (L) 10/12/2014 0516   GFRNONAA >60 07/24/2018 0534   GFRNONAA 69 04/23/2018 1204   GFRAA >60 07/24/2018 0534   GFRAA 80 04/23/2018 1204   COAG Lab Results  Component Value Date   INR 1.03 07/23/2018   INR 0.91 06/29/2018   Disposition:  Discharge to :Home  Allergies as of 07/24/2018      Reactions   Meloxicam Other (See Comments)   GI bleeding   Nsaids Other (See Comments)   Gi bleeding   Sitagliptin Hives, Itching   Januvia       Medication List    TAKE these medications   acetaminophen 325 MG tablet Commonly known as:  TYLENOL Take 325 mg by mouth every 6 (six) hours as needed.   albuterol 108 (90 Base) MCG/ACT inhaler Commonly known as:  PROVENTIL HFA;VENTOLIN HFA Inhale 2 puffs into the lungs every 6 (six) hours as needed for wheezing or shortness of breath.   amoxicillin-clavulanate 875-125 MG tablet Commonly known as:  AUGMENTIN Take 1 tablet by mouth every 12 (twelve) hours.   atorvastatin 10 MG tablet Commonly known as:  LIPITOR Take 1 tablet (10 mg total) by mouth at bedtime.   calcium carbonate 500 MG chewable tablet Commonly known as:  TUMS - dosed in mg elemental calcium Chew 3 tablets by mouth daily. Takes as she remembers   clopidogrel 75 MG tablet Commonly known as:  PLAVIX Take 1 tablet (75 mg total) by mouth daily.   co-enzyme Q-10 30 MG capsule Take 100 mg by mouth daily.   famotidine 20 MG tablet Commonly known as:  PEPCID Take 1 tablet (20 mg total) by mouth 2 (two) times daily as needed for heartburn or indigestion.   fluticasone 50 MCG/ACT nasal spray Commonly known as:   FLONASE Place 2 sprays into the nose daily as needed.   gabapentin 400 MG capsule Commonly known as:  NEURONTIN Take 1 capsule (400 mg total) by mouth 2 (two) times daily.   HYDROcodone-acetaminophen 5-325 MG tablet Commonly known as:  NORCO Take 0.5 tablets by mouth every 6 (six) hours as needed for moderate pain.   lisinopril 10 MG tablet Commonly known as:  PRINIVIL,ZESTRIL TAKE 1 TABLET BY MOUTH ONCE DAILY   Melatonin 5 MG Tabs Take 1 tablet by mouth at bedtime.   MULTIVITAMIN ADULTS 50+ Tabs Take 1 tablet by mouth daily.   sertraline 100 MG tablet Commonly known  as:  ZOLOFT TAKE 1 AND 1/2 TABLETS BY MOUTH DAILY FOR A TOTAL OF 150 MG   varenicline 1 MG tablet Commonly known as:  CHANTIX CONTINUING MONTH PAK Take 1 tablet (1 mg total) by mouth 2 (two) times daily. ** FILL AFTER THE STARTER MONTH PAK **   vitamin C 500 MG tablet Commonly known as:  ASCORBIC ACID Take 500 mg by mouth every other day.      Verbal and written Discharge instructions given to the patient. Wound care per Discharge AVS Follow-up Information    Dew, Marlow Baars, MD Follow up in 1 week(s).   Specialties:  Vascular Surgery, Radiology, Interventional Cardiology Why:  Can see [facility]. Has VAC. First post-operative woud check. No studies.  Contact information: [address] [place] [address] [phone]          Signed: Tonette Lederer, PA-C  07/24/2018, 10:45 AM

## 2018-07-24 NOTE — Anesthesia Postprocedure Evaluation (Signed)
Anesthesia Post Note  Patient: Andrea Holden  Procedure(s) Performed: DEBRIDEMENT WOUND (Left ) APPLICATION OF WOUND VAC (Left )  Patient location during evaluation: PACU Anesthesia Type: General Level of consciousness: awake and alert Pain management: pain level controlled Vital Signs Assessment: post-procedure vital signs reviewed and stable Respiratory status: spontaneous breathing, nonlabored ventilation and respiratory function stable Cardiovascular status: blood pressure returned to baseline and stable Postop Assessment: no apparent nausea or vomiting Anesthetic complications: no     Last Vitals:  Vitals:   07/24/18 0003 07/24/18 0407  BP: (!) 120/58 (!) 131/51  Pulse: (!) 49 (!) 47  Resp: 16 16  Temp: 36.9 C 36.4 C  SpO2: 95% 95%    Last Pain:  Vitals:   07/24/18 0407  TempSrc: Oral  PainSc:                  Jovita Gamma

## 2018-07-24 NOTE — Care Management Note (Signed)
Case Management Note  Patient Details  Name: Andrea Holden MRN: [id number] Date of Birth: 10-16-1945   Patient to discharge home toady with resumption home health order through [facility].  Kimberley with [facility] notified of admission and discharge.  Patient states she lives at home alone. Daughter and son in law live locally for support. PCP Lada. Patient denies issues with obtaining medication or with transportation.  Patient had [employer] Wound Vac in supplies in the room.  MD placed home vac prior to discharge.  Vac to be changed at home MWF.   Subjective/Objective:                    Action/Plan:   Expected Discharge Date:  07/24/18               Expected Discharge Plan:  Home w Home Health Services  In-House Referral:     Discharge planning Services  CM Consult  Post Acute Care Choice:  Resumption of Svcs/PTA Provider Choice offered to:     DME Arranged:    DME Agency:     HH Arranged:  RN HH Agency:  [facility]  Status of Service:  Completed, signed off  If discussed at Long Length of Stay Meetings, dates discussed:    Additional Comments:  Chapman Fitch, RN 07/24/2018, 11:41 AM

## 2018-07-24 NOTE — Progress Notes (Signed)
Wound vac without charging cord and alarming with low battery; new wound vac machine changed out for one with charging cord. Wound vac on continuous @ 125. Windy Carina, RN 1:18 AM 07/24/2018

## 2018-07-24 NOTE — Progress Notes (Signed)
Discharge order received. Patient is alert and oriented. Vital signs stable . No signs of acute distress. Discharge instructions given. Patient verbalized understanding. No other issues noted at this time.   

## 2018-07-24 NOTE — Discharge Instructions (Signed)
Vascular Surgery Discharge Instructions 1) You may shower. Please keep groins clean and dry.  2) Use antibacterial soap to groin. Gently wash clean and pat dry.  3) No driving until cleared in our office 4) No driving while on pain medication  VAC dressing therapy: Changes M-W-F or Tues-Thur-Sat (visiting home nurse schedule permitting) Clean groin wound with normal saline. Gently pat dry. 125, Continuous.

## 2018-07-25 DIAGNOSIS — M5416 Radiculopathy, lumbar region: Secondary | ICD-10-CM | POA: Diagnosis not present

## 2018-07-25 DIAGNOSIS — E1142 Type 2 diabetes mellitus with diabetic polyneuropathy: Secondary | ICD-10-CM | POA: Diagnosis not present

## 2018-07-25 DIAGNOSIS — I350 Nonrheumatic aortic (valve) stenosis: Secondary | ICD-10-CM | POA: Diagnosis not present

## 2018-07-25 DIAGNOSIS — I119 Hypertensive heart disease without heart failure: Secondary | ICD-10-CM | POA: Diagnosis not present

## 2018-07-25 DIAGNOSIS — I25118 Atherosclerotic heart disease of native coronary artery with other forms of angina pectoris: Secondary | ICD-10-CM | POA: Diagnosis not present

## 2018-07-25 DIAGNOSIS — G8929 Other chronic pain: Secondary | ICD-10-CM | POA: Diagnosis not present

## 2018-07-25 DIAGNOSIS — J449 Chronic obstructive pulmonary disease, unspecified: Secondary | ICD-10-CM | POA: Diagnosis not present

## 2018-07-25 DIAGNOSIS — I70213 Atherosclerosis of native arteries of extremities with intermittent claudication, bilateral legs: Secondary | ICD-10-CM | POA: Diagnosis not present

## 2018-07-25 DIAGNOSIS — Z48812 Encounter for surgical aftercare following surgery on the circulatory system: Secondary | ICD-10-CM | POA: Diagnosis not present

## 2018-07-27 DIAGNOSIS — G8929 Other chronic pain: Secondary | ICD-10-CM | POA: Diagnosis not present

## 2018-07-27 DIAGNOSIS — M5416 Radiculopathy, lumbar region: Secondary | ICD-10-CM | POA: Diagnosis not present

## 2018-07-27 DIAGNOSIS — I70213 Atherosclerosis of native arteries of extremities with intermittent claudication, bilateral legs: Secondary | ICD-10-CM | POA: Diagnosis not present

## 2018-07-27 DIAGNOSIS — E1142 Type 2 diabetes mellitus with diabetic polyneuropathy: Secondary | ICD-10-CM | POA: Diagnosis not present

## 2018-07-27 DIAGNOSIS — I119 Hypertensive heart disease without heart failure: Secondary | ICD-10-CM | POA: Diagnosis not present

## 2018-07-27 DIAGNOSIS — J449 Chronic obstructive pulmonary disease, unspecified: Secondary | ICD-10-CM | POA: Diagnosis not present

## 2018-07-27 DIAGNOSIS — Z48812 Encounter for surgical aftercare following surgery on the circulatory system: Secondary | ICD-10-CM | POA: Diagnosis not present

## 2018-07-27 DIAGNOSIS — I350 Nonrheumatic aortic (valve) stenosis: Secondary | ICD-10-CM | POA: Diagnosis not present

## 2018-07-27 DIAGNOSIS — I25118 Atherosclerotic heart disease of native coronary artery with other forms of angina pectoris: Secondary | ICD-10-CM | POA: Diagnosis not present

## 2018-07-28 ENCOUNTER — Ambulatory Visit (INDEPENDENT_AMBULATORY_CARE_PROVIDER_SITE_OTHER): Payer: Medicare HMO

## 2018-07-28 DIAGNOSIS — G8929 Other chronic pain: Secondary | ICD-10-CM | POA: Diagnosis not present

## 2018-07-28 DIAGNOSIS — I739 Peripheral vascular disease, unspecified: Secondary | ICD-10-CM

## 2018-07-28 DIAGNOSIS — I25118 Atherosclerotic heart disease of native coronary artery with other forms of angina pectoris: Secondary | ICD-10-CM | POA: Diagnosis not present

## 2018-07-28 DIAGNOSIS — I70213 Atherosclerosis of native arteries of extremities with intermittent claudication, bilateral legs: Secondary | ICD-10-CM | POA: Diagnosis not present

## 2018-07-28 DIAGNOSIS — Z48812 Encounter for surgical aftercare following surgery on the circulatory system: Secondary | ICD-10-CM | POA: Diagnosis not present

## 2018-07-28 DIAGNOSIS — I350 Nonrheumatic aortic (valve) stenosis: Secondary | ICD-10-CM | POA: Diagnosis not present

## 2018-07-28 DIAGNOSIS — T8131XA Disruption of external operation (surgical) wound, not elsewhere classified, initial encounter: Secondary | ICD-10-CM | POA: Diagnosis not present

## 2018-07-28 DIAGNOSIS — Z599 Problem related to housing and economic circumstances, unspecified: Secondary | ICD-10-CM

## 2018-07-28 DIAGNOSIS — I119 Hypertensive heart disease without heart failure: Secondary | ICD-10-CM | POA: Diagnosis not present

## 2018-07-28 DIAGNOSIS — Z598 Other problems related to housing and economic circumstances: Secondary | ICD-10-CM

## 2018-07-28 DIAGNOSIS — E1142 Type 2 diabetes mellitus with diabetic polyneuropathy: Secondary | ICD-10-CM | POA: Diagnosis not present

## 2018-07-28 DIAGNOSIS — J449 Chronic obstructive pulmonary disease, unspecified: Secondary | ICD-10-CM | POA: Diagnosis not present

## 2018-07-28 DIAGNOSIS — M5416 Radiculopathy, lumbar region: Secondary | ICD-10-CM | POA: Diagnosis not present

## 2018-07-28 LAB — AEROBIC/ANAEROBIC CULTURE (SURGICAL/DEEP WOUND)

## 2018-07-28 LAB — AEROBIC/ANAEROBIC CULTURE W GRAM STAIN (SURGICAL/DEEP WOUND)

## 2018-07-28 NOTE — Patient Instructions (Addendum)
1. Andrea Holden Patient Experience: [phone] 2. Complete financial application 3. Monitor your blood pressure using your wrist monitor and record in [facility] Calendar 4. Silver Sneakers classes are free through [employer], to find locations near you visit [email or web address];  Possible locations: [facility] [address] [place], [place] [address]  [phone]   Goals Addressed            This Visit's Progress   . "I really need assistance with all these mediical bills" (pt-stated)       Current Barriers:  Marland Kitchen. Multiple medical bills . Finances  Nurse Case Manager Clinical Goal(s): Over the next 14 days, patient will report completion of application for financial assistance and placing in mail  Interventions:  . Provided with [facility] Application and assisted with completion . Reviewed application and documents need for application completion (power bill for proof of residence, last 3 month bank statements)  Patient Self Care Activities:  . Complete [place] financial application and mail to:  [facility] Business office ATTN: Customer Service   [address] [place] [address]   *initial goal documentation     . "i want to get back to running in the dog ring" (pt-stated)       Current Barriers:  . Wound vac . Current health status . Lack of stamina and strength   Nurse Case Manager Clinical Goal(s): Over the next 3 months, patient will verbalize engaging in silver sneakers to improve stamina and strength  Interventions:  . Reviewed patients benefits through [employer] insurance . Provided patient with gym options that utilize silver sneakers   Patient Self Care Activities:  . Patient will explore options for exercise once wound removed . Patient will engage in exercise to improve stamina and strength   *initial goal documentation    . "I want to get rid of the wound vac" (pt-stated)       Current Barriers:   . Poor wound healing/infection post vascular surgery  Nurse Case Manager Clinical Goal(s): Over the next 14 days, patient will report improvement in wound healing as assessed by home health nurse.  Interventions:  . Provided emotional support and reassurance . Provided example of diet that promotes wound healing (EMMI Education)  Patient Self Care Activities:  . Patient will increase protein in her diet  . Patient will engage in self care activities/plan of care as prescribed by vascular surgeon and [facility]  *initial goal documentation      . "this financial stress is making my BP increase" (pt-stated)       Current Barriers:  Marland Kitchen. Knowledge deficit secondary to effects of stress on BP . Knowledge deficit related to HTN self care   Nurse Case Manager Clinical Goal(s): Over the next 2 weeks, patient will check and record BP 3 days a week and report numbers to case manager during next encounter  Interventions:  . Basic HTN education provided including effects on cardiac function . Provided patient with [facility] Calendar to record BP . Discussed balancing hyponatrimia and HTN with increased sodium intake  Patient Self Care Activities:  . Patient will check BP weekly and record   *initial goal documentation    Thank you allowing the Chronic Care Management Team to be a part of your care!   Please call a member of the CCM (Chronic Care Management) Team with any questions or case management needs:   Arthur HolmsPortia Payne, Rn, BSN Nurse Care Coordinator  [phone]  Karalee Height, PharmD  Clinical Pharmacist  [phone]     Aortic Valve Stenosis   Aortic valve stenosis is a narrowing of the aortic valve in the heart. The aortic valve opens and closes to regulate blood flow between the left side of the heart (left ventricle) and the artery that leads away from the heart (aorta). When the aortic valve becomes narrow, it is difficult for the heart to pump blood out to the body,  which causes the heart to work harder. The extra work can weaken the heart muscle over time. Aortic valve stenosis can range from mild to severe. If it is not treated, it can become more severe over time and lead to heart failure. What are the causes? This condition may be caused by:  Buildup of calcium around and on the aortic valve. This can occur with aging. This is the most common cause of aortic valve stenosis.  A heart problem that developed in the womb (birth defect).  Rheumatic fever.  Radiation to the chest. What increases the risk? You may be more likely to develop this condition if:  You are older than age 36.  You were born with an abnormal bicuspid valve. What are the signs or symptoms? You may not have any symptoms until your condition becomes severe. It may take 10-20 years for mild or moderate aortic valve stenosis to become severe. Symptoms may include:  Shortness of breath. This may get worse during physical activity.  Feeling unusually weak and tired (fatigue).  Extreme discomfort in the chest, neck, or arm during physical activity (angina).  A heartbeat that is irregular or faster than normal (palpitations).  Dizziness or fainting. This may happen when you get physically tired or after you take certain heart medicines, such as nitroglycerin. How is this diagnosed? This condition may be diagnosed with:  A physical exam.  Echocardiogram. This is a type of imaging test that uses sound waves (ultrasound) to make images of your heart. There are two kinds of this test that may be used. ? Transthoracic echocardiogram (TTE). For this type, a wand-like tool (transducer) is moved over your chest to create ultrasound images that are recorded by a computer. ? Transesophageal echocardiogram (TEE). For this type, a flexible tube (probe) is inserted down the part of the body that moves food from your mouth to your stomach (esophagus). The heart and the esophagus are close to  each other. Your health care provider will use the probe to take clear, detailed pictures of the heart.  Cardiac catheterization. For this procedure, a small, thin tube (catheter) is passed through a large vein in your neck, groin, or arm. The catheter is used to get information about arteries, structures, blood pressure, and oxygen levels in your heart.  Stress tests. These are tests that evaluate the blood supply to your heart and your heart's response to exercise. You may work with a health care provider who specializes in the heart (cardiologist) for diagnosis and treatment. How is this treated? Treatment depends on how severe your condition is and what your symptoms are. You will need to have your heart checked regularly to make sure that your condition is not getting worse or causing serious problems. Treatment may also include:  Surgery to replace your aortic valve. This is the most common treatment for aortic valve stenosis, and it is the only treatment to cure the condition. Several types of surgeries are available. The surgery may be done: ? Through a large incision over  your heart (open-heart surgery). ? Through small incisions, using a flexible tube called a catheter (transcatheter aortic valve replacement, TAVR).  Medicines that help to keep your heart rate regular.  Medicines that thin your blood (anticoagulants) to prevent blood clots.  Antibiotic medicines to help prevent infection. If your condition is mild, you may only need regular follow-up visits for monitoring. Follow these instructions at home: Lifestyle  Limit alcohol intake to no more than 1 drink a day for nonpregnant women and 2 drinks a day for men. One drink equals 12 oz of beer, 5 oz of wine, or 1 oz of hard liquor.  Do not use any products that contain nicotine or tobacco, such as cigarettes and e-cigarettes. If you need help quitting, ask your health care provider.  Work with your health care provider to  manage your blood pressure and cholesterol.  Maintain a healthy weight. Eating and drinking    Eat a heart-healthy diet that includes plenty of fresh fruits and vegetables, whole grains, lean protein, and low-fat or nonfat dairy.  Limit how much caffeine you drink. Caffeine can affect your heart's rate and rhythm.  Avoid foods that are: ? High in salt (sodium), saturated fat, or sugar. ? Canned or highly processed. ? Fried.  Follow instructions from your health care provider about any other eating or drinking restrictions. Activity  Exercise regularly and return to your normal activities as told by your health care provider. Ask your health care provider what amount and type of physical activity is safe for you. ? If your aortic valve stenosis is mild, you may only need to avoid very intense physical activity, such as heavy weight lifting. ? The more severe your aortic valve stenosis is, the more activities you may need to avoid. If you are taking blood thinners:  Before you take any medicines that contain aspirin or NSAIDs, talk with your health care provider. These medicines increase your risk for dangerous bleeding.  Take your medicine exactly as told, at the same time every day.  Avoid activities that could cause injury or bruising, and follow instructions about how to prevent falls.  Wear a medical alert bracelet or carry a card that lists what medicines you take. General instructions  Take over-the-counter and prescription medicines only as told by your health care provider.  If you were prescribed an antibiotic, take it as told by your health care provider. Do not stop taking the antibiotic even if you start to feel better.  If you are a woman and you plan to become pregnant, talk with your health care provider before you become pregnant.  Before you have any type of medical or dental procedure or surgery, tell all health care providers that you have aortic valve  stenosis. This may affect the treatment that you receive.  Keep all follow-up visits as told by your health care provider. This is important. Contact a health care provider if:  You have a fever. Get help right away if:  You develop any of the following symptoms: ? Chest pain. ? Chest tightness. ? Shortness of breath. ? Trouble breathing.  You feel light-headed.  You feel like you might faint.  Your heartbeat is irregular or faster than normal. These symptoms may represent a serious problem that is an emergency. Do not wait to see if the symptoms will go away. Get medical help right away. Call your local emergency services (911 in the [place] Do not drive yourself to the hospital. Summary  Aortic valve  stenosis is a narrowing of the aortic valve in the heart. The aortic valve opens and closes to regulate blood flow between the left side of the heart (left ventricle) and the artery that leads away from the heart (aorta).  Aortic valve stenosis can range from mild to severe. If it is not treated, it can become more severe over time and lead to heart failure.  Treatment depends on how severe your condition is and what your symptoms are. You will need to have your heart checked regularly to make sure that your condition is not getting worse or causing serious problems.  Exercise regularly and return to your normal activities as told by your health care provider. Ask your health care provider what amount and type of physical activity is safe for you. This information is not intended to replace advice given to you by your health care provider. Make sure you discuss any questions you have with your health care provider.

## 2018-07-28 NOTE — Chronic Care Management (AMB) (Signed)
Chronic Care Management   Initial Visit Note  07/28/2018 Name: SAMAYRA HEBEL MRN: [id number] DOB: 1946/01/22  Referred by: Arnetha Courser, MD Reason for referral : Chronic Care Management (Initial office visit)   Subjective: "I think I would be doing OK if I could get this wound vac off" "I want to be able to walk for exercise and run in the dog ring to show my dogs"  Objective:  BP Readings from Last 3 Encounters:  07/24/18 (!) 131/51  07/17/18 (!) 148/61  07/10/18 (!) 126/42    Assessment: Taunya J Knapke is a 73 y.o. year old female who sees Lada, Satira Anis, MD for primary care. Angel Little, [facility] Transitions of Care RN asked the CCM team to consult the patient for assistance with chronic disease management related to multiple chronic diseases. Referral was placed 07/13/17. Today Ms. Stepka met with the CCM Team to establish health goals and discuss needs.  Goals Addressed            This Visit's Progress   . "I really need assistance with all these mediical bills" (pt-stated)       Current Barriers:  Marland Kitchen Multiple medical bills . Finances  Nurse Case Manager Clinical Goal(s): Over the next 14 days, patient will report completion of application for financial assistance and placing in mail  Interventions:  . Provided with [facility] Application and assisted with completion . Reviewed application and documents need for application completion (power bill for proof of residence, last 3 month bank statements)  Patient Self Care Activities:  . Complete [facility] financial application and mail to:  [facility] office ATTN: Customer Service   [place] [id number]   *initial goal documentation     . "i want to get back to running in the dog ring" (pt-stated)       Current Barriers:  . Wound vac . Current health status . Lack of stamina and strength   Nurse Case Manager Clinical Goal(s): Over the next 3 months,  patient will verbalize engaging in silver sneakers to improve stamina and strength  Interventions:  . Reviewed patients benefits through [employer] . Provided patient with gym options that utilize silver sneakers   Patient Self Care Activities:  . Patient will explore options for exercise once wound removed . Patient will engage in exercise to improve stamina and strength   *initial goal documentation    . "I want to get rid of the wound vac" (pt-stated)       Current Barriers:  . Poor wound healing/infection post vascular surgery  Nurse Case Manager Clinical Goal(s): Over the next 14 days, patient will report improvement in wound healing as assessed by home health nurse.  Interventions:  . Provided emotional support and reassurance . Provided example of diet that promotes wound healing (EMMI Education)  Patient Self Care Activities:  . Patient will increase protein in her diet  . Patient will engage in self care activities/plan of care as prescribed by vascular surgeon and [facility] nurse  *initial goal documentation    . "this financial stress is making my BP increase" (pt-stated)       Current Barriers:  Marland Kitchen Knowledge deficit secondary to effects of stress on BP . Knowledge deficit related to HTN self care   Nurse Case Manager Clinical Goal(s): Over the next 2 weeks, patient will check and record BP 3 days a week and report numbers to case manager during next  encounter  Interventions:  . Basic HTN education provided including effects on cardiac function . Provided patient with [facility] Calendar to record BP . Discussed balancing hyponatrimia and HTN with increased sodium intake  Patient Self Care Activities:  . Patient will check BP weekly and record   *initial goal documentation     Plan: Will follow up with patient in 2 weeks   Ms. Clopper was given information about Chronic Care Management services today including:  1. CCM service includes personalized support  from designated clinical staff supervised by her physician, including individualized plan of care and coordination with other care providers 2. 24/7 contact phone numbers for assistance for urgent and routine care needs. 3. Service will only be billed when office clinical staff spend 20 minutes or more in a month to coordinate care. 4. Only one practitioner may furnish and bill the service in a calendar month. 5. The patient may stop CCM services at any time (effective at the end of the month) by phone call to the office staff. 6. The patient will be responsible for cost sharing (co-pay) of up to 20% of the service fee (after annual deductible is met).  Patient agreed to services and verbal consent obtained.    Johnston Maddocks E. Rollene Rotunda, RN, BSN Nurse Care Coordinator [facility] Care Management  [phone]

## 2018-07-29 ENCOUNTER — Other Ambulatory Visit: Payer: Self-pay

## 2018-07-29 DIAGNOSIS — I70213 Atherosclerosis of native arteries of extremities with intermittent claudication, bilateral legs: Secondary | ICD-10-CM | POA: Diagnosis not present

## 2018-07-29 DIAGNOSIS — I119 Hypertensive heart disease without heart failure: Secondary | ICD-10-CM | POA: Diagnosis not present

## 2018-07-29 DIAGNOSIS — Z48812 Encounter for surgical aftercare following surgery on the circulatory system: Secondary | ICD-10-CM | POA: Diagnosis not present

## 2018-07-29 DIAGNOSIS — I25118 Atherosclerotic heart disease of native coronary artery with other forms of angina pectoris: Secondary | ICD-10-CM | POA: Diagnosis not present

## 2018-07-29 DIAGNOSIS — E1142 Type 2 diabetes mellitus with diabetic polyneuropathy: Secondary | ICD-10-CM | POA: Diagnosis not present

## 2018-07-29 DIAGNOSIS — G8929 Other chronic pain: Secondary | ICD-10-CM | POA: Diagnosis not present

## 2018-07-29 DIAGNOSIS — M5416 Radiculopathy, lumbar region: Secondary | ICD-10-CM | POA: Diagnosis not present

## 2018-07-29 DIAGNOSIS — I350 Nonrheumatic aortic (valve) stenosis: Secondary | ICD-10-CM | POA: Diagnosis not present

## 2018-07-29 DIAGNOSIS — J449 Chronic obstructive pulmonary disease, unspecified: Secondary | ICD-10-CM | POA: Diagnosis not present

## 2018-07-29 MED ORDER — GABAPENTIN 400 MG PO CAPS: 400.0000 mg | ORAL_CAPSULE | Freq: Two times a day (BID) | ORAL | 1 refills | Status: DC

## 2018-07-29 MED ORDER — ATORVASTATIN CALCIUM 10 MG PO TABS: 10.0000 mg | ORAL_TABLET | Freq: Every day | ORAL | 1 refills | Status: DC

## 2018-07-29 NOTE — Telephone Encounter (Signed)
Thank you . Approved.

## 2018-07-29 NOTE — Telephone Encounter (Signed)
Patient needs 90 day

## 2018-07-30 DIAGNOSIS — M5416 Radiculopathy, lumbar region: Secondary | ICD-10-CM | POA: Diagnosis not present

## 2018-07-30 DIAGNOSIS — I70213 Atherosclerosis of native arteries of extremities with intermittent claudication, bilateral legs: Secondary | ICD-10-CM | POA: Diagnosis not present

## 2018-07-30 DIAGNOSIS — I350 Nonrheumatic aortic (valve) stenosis: Secondary | ICD-10-CM | POA: Diagnosis not present

## 2018-07-30 DIAGNOSIS — Z48812 Encounter for surgical aftercare following surgery on the circulatory system: Secondary | ICD-10-CM | POA: Diagnosis not present

## 2018-07-30 DIAGNOSIS — I119 Hypertensive heart disease without heart failure: Secondary | ICD-10-CM | POA: Diagnosis not present

## 2018-07-30 DIAGNOSIS — I25118 Atherosclerotic heart disease of native coronary artery with other forms of angina pectoris: Secondary | ICD-10-CM | POA: Diagnosis not present

## 2018-07-30 DIAGNOSIS — E1142 Type 2 diabetes mellitus with diabetic polyneuropathy: Secondary | ICD-10-CM | POA: Diagnosis not present

## 2018-07-30 DIAGNOSIS — J449 Chronic obstructive pulmonary disease, unspecified: Secondary | ICD-10-CM | POA: Diagnosis not present

## 2018-07-30 DIAGNOSIS — G8929 Other chronic pain: Secondary | ICD-10-CM | POA: Diagnosis not present

## 2018-07-31 ENCOUNTER — Ambulatory Visit (INDEPENDENT_AMBULATORY_CARE_PROVIDER_SITE_OTHER): Payer: Medicare (Managed Care) | Admitting: Vascular Surgery

## 2018-07-31 ENCOUNTER — Ambulatory Visit (INDEPENDENT_AMBULATORY_CARE_PROVIDER_SITE_OTHER): Payer: Medicare HMO | Admitting: Nurse Practitioner

## 2018-07-31 ENCOUNTER — Encounter (INDEPENDENT_AMBULATORY_CARE_PROVIDER_SITE_OTHER): Payer: Self-pay | Admitting: Nurse Practitioner

## 2018-07-31 VITALS — BP 171/72 | HR 79 | Resp 20 | Ht 61.0 in | Wt 216.4 lb

## 2018-07-31 DIAGNOSIS — R600 Localized edema: Secondary | ICD-10-CM | POA: Insufficient documentation

## 2018-07-31 DIAGNOSIS — T8149XA Infection following a procedure, other surgical site, initial encounter: Secondary | ICD-10-CM

## 2018-07-31 DIAGNOSIS — E782 Mixed hyperlipidemia: Secondary | ICD-10-CM

## 2018-07-31 NOTE — Progress Notes (Signed)
Subjective:    Patient ID: Andrea Holden, female    DOB: 09/27/1945, 73 y.o.   MRN: [id number] Chief Complaint  Patient presents with  . Follow-up    HPI  Andrea Holden is a 73 y.o. female The patient has a wound VAC on her left groin.  There is some spell however this may just be due to general wound VAC odor.  It is not nearly as bad as it was during her previous visit.  The patient reports that the initial placement of her wound VAC by home health was very leaky and had lots of drainage coming around the edges.  This new placement has not had any leaking and there is approximately 200 cc in her wound VAC container.  Patient states that the wound VAC is painful but tolerable.  She also states that her lower extremity wounds are healing well with Unna wrap therapy.  Patient denies any fever, chills, nausea, vomiting or diarrhea.  She denies any chest pain or shortness of breath.  Past Medical History:  Diagnosis Date  . Allergy    seasonal allergies  . Anemia 2014   needed 5 units of blood d/t passing out, weak  . Anxiety   . Aortic stenosis 12/24/2017   Echo Aug 2018  . Arthritis   . Asthma    allergy induced asthma  . Cardiac murmur 12/12/2017  . Cataract   . Cataract    left  . Complication of anesthesia    arrhythmia following colonoscopy  . Degenerative disc disease, lumbar   . Degenerative disc disease, lumbar   . Depression   . Diabetes mellitus   . Diabetic neuropathy (HCC) 11/16/2015  . Dysrhythmia    stenosis of left ventricle  . GERD (gastroesophageal reflux disease)   . H/O transfusion    patient was [facility], blood type O+  . History of chicken pox   . History of hiatal hernia   . History of measles, mumps, or rubella   . HOH (hard of hearing)    does not use hearing aides yet  . Hyperlipidemia   . Hypertension   . Irregular heartbeat   . LVH (left ventricular hypertrophy) 12/24/2017   Echo Aug 2018  .  Neuropathy   . Opiate use 11/16/2015  . Peripheral arterial disease (HCC) 05/06/2018   At rest, left > right; refer to vasc  . Pes planus of both feet 11/16/2015  . Plantar fasciitis of right foot 11/16/2015  . Wheezing     Past Surgical History:  Procedure Laterality Date  . ABDOMINAL HYSTERECTOMY    . APPLICATION OF WOUND VAC Left 07/23/2018   Procedure: APPLICATION OF WOUND VAC;  Surgeon: Annice Needyew, Jason S, MD;  Location: [facility] ORS;  Service: Vascular;  Laterality: Left;  . banck injections    . CATARACT EXTRACTION W/PHACO Right 05/30/2015   Procedure: CATARACT EXTRACTION PHACO AND INTRAOCULAR LENS PLACEMENT (IOC);  Surgeon: Galen ManilaWilliam Porfilio, MD;  Location: [facility] ORS;  Service: Ophthalmology;  Laterality: Right;  US 00:57 AP% 20.9 CDE 11.99 fluid pack lot #[id number]  . CHOLECYSTECTOMY  1970  . COLON SURGERY  2013   blocked colon  . COLONOSCOPY WITH PROPOFOL N/A 01/20/2018   Procedure: COLONOSCOPY WITH PROPOFOL;  Surgeon: Midge MiniumWohl, Darren, MD;  [facility];  Service: Endoscopy;  Laterality: N/A;  . ENDARTERECTOMY FEMORAL Left 07/08/2018   Procedure: ENDARTERECTOMY FEMORAL;  Surgeon: Annice Needyew, Jason S, MD;  Location: [facility] ORS;  Service: Vascular;  Laterality: Left;  . EYE SURGERY Right    cataract surgery  . FOOT FUSION Right 2018   metal in foot  . GALLBLADDER SURGERY  1970  . GASTRIC BYPASS  2010   lost 178 lbs and regained 40 lbs last few years  . HUMERUS FRACTURE SURGERY Right    metal plate with screws  . internal bleeding  2016   ulcer in past  . LOWER EXTREMITY ANGIOGRAM Left 07/08/2018   Procedure: LOWER EXTREMITY ANGIOGRAM;  Surgeon: Annice Needyew, Jason S, MD;  Location: [facility] ORS;  Service: Vascular;  Laterality: Left;  . LOWER EXTREMITY ANGIOGRAPHY Left 05/27/2018   Procedure: LOWER EXTREMITY ANGIOGRAPHY;  Surgeon: Annice Needyew, Jason S, MD;  Location: [facility] INVASIVE CV LAB;  Service: Cardiovascular;  Laterality: Left;  . MOUTH SURGERY     root canals and crowns and extractions  . OVARY SURGERY     . SKIN GRAFT Right 2018   RT foot. foot has been rebuilt.  it is full of metal  . TENOTOMY ACHILLES TENDON Right    Percuntaneous. metal in foot  . TONSILLECTOMY    . WOUND DEBRIDEMENT Left 07/23/2018   Procedure: DEBRIDEMENT WOUND;  Surgeon: Annice Needyew, Jason S, MD;  Location: [facility] ORS;  Service: Vascular;  Laterality: Left;    Social History   Socioeconomic History  . Marital status: Divorced    Spouse name: Not on file  . Number of children: 1  . Years of education: Not on file  . Highest education level: Associate degree: academic program  Occupational History  . Occupation: Lawyerubstitute Teacher    Comment: [employer]  Social Needs  . Financial resource strain: Very hard  . Food insecurity:    Worry: Never true    Inability: Never true  . Transportation needs:    Medical: No    Non-medical: No  Tobacco Use  . Smoking status: Former Smoker    Packs/day: 0.50    Years: 55.00    Pack years: 27.50    Types: Cigarettes    Start date: 07/01/1960    Last attempt to quit: 05/26/2018    Years since quitting: 0.1  . Smokeless tobacco: Never Used  . Tobacco comment: still using chantix  Substance and Sexual Activity  . Alcohol use: Yes    Alcohol/week: 0.0 standard drinks    Comment: 2 drinks per month  . Drug use: Not Currently    Types: Marijuana    Comment: used for pain  . Sexual activity: Not Currently  Lifestyle  . Physical activity:    Days per week: 0 days    Minutes per session: 0 min  . Stress: Only a little  Relationships  . Social connections:    Talks on phone: Patient refused    Gets together: Patient refused    Attends religious service: Patient refused    Active member of club or organization: Patient refused    Attends meetings of clubs or organizations: Patient refused    Relationship status: Divorced  . Intimate partner violence:    Fear of current or ex partner: No    Emotionally abused: No    Physically abused: No     Forced sexual activity: No  Other Topics Concern  . Not on file  Social History Narrative  . Not on file    Family History  Problem Relation Age of Onset  . Asthma Mother   . COPD Mother   . Arthritis Father   .  Depression Father   . Heart disease Father   . Hypertension Father   . Stroke Father   . Heart attack Father   . Arthritis Brother   . Depression Brother   . Diabetes Brother   . Heart disease Brother   . Hyperlipidemia Brother   . Hypertension Brother   . Stroke Brother   . Vision loss Brother   . Heart attack Brother   . Diabetes Maternal Grandmother   . Thyroid disease Daughter   . Colitis Daughter   . Breast cancer Neg Hx     Allergies  Allergen Reactions  . Meloxicam Other (See Comments)    GI bleeding  . Nsaids Other (See Comments)    Gi bleeding  . Sitagliptin Hives and Itching    Januvia      Review of Systems   Review of Systems: Negative Unless Checked Constitutional: [] Weight loss  [] Fever  [] Chills Cardiac: [] Chest pain   []  Atrial Fibrillation  [] Palpitations   [] Shortness of breath when laying flat   [] Shortness of breath with exertion. [] Shortness of breath at rest Vascular:  [] Pain in legs with walking   [] Pain in legs with standing [] Pain in legs when laying flat   [] Claudication    [] Pain in feet when laying flat    [] History of DVT   [] Phlebitis   [x] Swelling in legs   [] Varicose veins   [] Non-healing ulcers Pulmonary:   [] Uses home oxygen   [] Productive cough   [] Hemoptysis   [] Wheeze  [] COPD   [] Asthma Neurologic:  [] Dizziness   [] Seizures  [] Blackouts [] History of stroke   [] History of TIA  [] Aphasia   [] Temporary Blindness   [] Weakness or numbness in arm   [] Weakness or numbness in leg Musculoskeletal:   [] Joint swelling   [] Joint pain   [x] Low back pain  []  History of Knee Replacement [] Arthritis [] back Surgeries  []  Spinal Stenosis    Hematologic:  [] Easy bruising  [] Easy bleeding   [] Hypercoagulable state    [] Anemic Gastrointestinal:  [] Diarrhea   [] Vomiting  [] Gastroesophageal reflux/heartburn   [] Difficulty swallowing. [] Abdominal pain Genitourinary:  [] Chronic kidney disease   [] Difficult urination  [] Anuric   [] Blood in urine [] Frequent urination  [] Burning with urination   [] Hematuria Skin:  [] Rashes   [] Ulcers [x] Wounds Psychological:  [] History of anxiety   []  History of major depression  []  Memory Difficulties     Objective:   Physical Exam  BP (!) 171/72 (BP Location: Right Arm, Patient Position: Sitting)   Pulse 79   Resp 20   Ht 5\' 1"  (1.549 m)   Wt 216 lb 6.4 oz (98.2 kg)   BMI 40.89 kg/m   Gen: WD/WN, NAD Head: Nettie/AT, No temporalis wasting.  Ear/Nose/Throat: Hearing grossly intact, nares w/o erythema or drainage Eyes: PER, EOMI, sclera nonicteric.  Neck: Supple, no masses.  No JVD.  Pulmonary:  Good air movement, no use of accessory muscles.  Cardiac: RRR Vascular:  Wound VAC in left groin Vessel Right Left  Radial Palpable Palpable   Gastrointestinal: soft, non-distended. No guarding/no peritoneal signs.  Musculoskeletal: M/S 5/5 throughout.  No deformity or atrophy.  Neurologic: Pain and light touch intact in extremities.  Symmetrical.  Speech is fluent. Motor exam as listed above. Psychiatric: Judgment intact, Mood & affect appropriate for pt's clinical situation. Dermatologic: No Venous rashes. No Ulcers Noted.  No changes consistent with cellulitis. Lymph : No Cervical lymphadenopathy, no lichenification or skin changes of chronic lymphedema.      Assessment &  Plan:   1. Surgical site infection Educated the patient as to proper wound VAC placement, it should not have draining coming below the edges of the dressing.  This happened she very likely does not have a good seal and she needs to contact home health for placement.  Advised patient to ensure that home health will be coming to change her dressing prior to next visit as we will take down wound VAC to  assess wound site.  We will follow-up with in 2 weeks  2. Mixed hyperlipidemia Continue statin as ordered and reviewed, no changes at this time   3. Lower extremity edema The patient placed in bilateral Unna wraps after her swelling caused her right foot to have a splitting of the skin.  The patient states that the wound is looking better as well as the swelling is more tolerable.  She will continue to be placed in a wraps at this time.  She will have her own wraps changed weekly by home health.  We will assess wound healing and swelling at her next visit.   Current Outpatient Medications on File Prior to Visit  Medication Sig Dispense Refill  . acetaminophen (TYLENOL) 325 MG tablet Take 325 mg by mouth every 6 (six) hours as needed.     Marland Kitchen albuterol (PROVENTIL HFA;VENTOLIN HFA) 108 (90 Base) MCG/ACT inhaler Inhale 2 puffs into the lungs every 6 (six) hours as needed for wheezing or shortness of breath. 1 Inhaler 1  . amoxicillin-clavulanate (AUGMENTIN) 875-125 MG tablet Take 1 tablet by mouth every 12 (twelve) hours. 14 tablet 0  . atorvastatin (LIPITOR) 10 MG tablet Take 1 tablet (10 mg total) by mouth at bedtime. 90 tablet 1  . calcium carbonate (TUMS - DOSED IN MG ELEMENTAL CALCIUM) 500 MG chewable tablet Chew 3 tablets by mouth daily. Takes as she remembers    . clopidogrel (PLAVIX) 75 MG tablet Take 1 tablet (75 mg total) by mouth daily. 90 tablet 0  . co-enzyme Q-10 30 MG capsule Take 100 mg by mouth daily.     . famotidine (PEPCID) 20 MG tablet Take 1 tablet (20 mg total) by mouth 2 (two) times daily as needed for heartburn or indigestion. 60 tablet 2  . fluticasone (FLONASE) 50 MCG/ACT nasal spray Place 2 sprays into the nose daily as needed.    . gabapentin (NEURONTIN) 400 MG capsule Take 1 capsule (400 mg total) by mouth 2 (two) times daily. 180 capsule 1  . HYDROcodone-acetaminophen (NORCO) 5-325 MG tablet Take 0.5 tablets by mouth every 6 (six) hours as needed for moderate pain.  30 tablet 0  . lisinopril (PRINIVIL,ZESTRIL) 10 MG tablet TAKE 1 TABLET BY MOUTH ONCE DAILY 90 tablet 1  . Melatonin 5 MG TABS Take 1 tablet by mouth at bedtime.     . Multiple Vitamins-Minerals (MULTIVITAMIN ADULTS 50+) TABS Take 1 tablet by mouth daily.     . sertraline (ZOLOFT) 100 MG tablet TAKE 1 AND 1/2 TABLETS BY MOUTH DAILY FOR A TOTAL OF 150 MG 135 tablet 1  . vitamin C (ASCORBIC ACID) 500 MG tablet Take 500 mg by mouth every other day.      No current facility-administered medications on file prior to visit.     There are no Patient Instructions on file for this visit. No follow-ups on file.   Georgiana Spinner, NP  This note was completed with Office manager.  Any errors are purely unintentional.

## 2018-08-01 DIAGNOSIS — M5416 Radiculopathy, lumbar region: Secondary | ICD-10-CM | POA: Diagnosis not present

## 2018-08-01 DIAGNOSIS — I350 Nonrheumatic aortic (valve) stenosis: Secondary | ICD-10-CM | POA: Diagnosis not present

## 2018-08-01 DIAGNOSIS — E1142 Type 2 diabetes mellitus with diabetic polyneuropathy: Secondary | ICD-10-CM | POA: Diagnosis not present

## 2018-08-01 DIAGNOSIS — I70213 Atherosclerosis of native arteries of extremities with intermittent claudication, bilateral legs: Secondary | ICD-10-CM | POA: Diagnosis not present

## 2018-08-01 DIAGNOSIS — Z48812 Encounter for surgical aftercare following surgery on the circulatory system: Secondary | ICD-10-CM | POA: Diagnosis not present

## 2018-08-01 DIAGNOSIS — J449 Chronic obstructive pulmonary disease, unspecified: Secondary | ICD-10-CM | POA: Diagnosis not present

## 2018-08-01 DIAGNOSIS — I119 Hypertensive heart disease without heart failure: Secondary | ICD-10-CM | POA: Diagnosis not present

## 2018-08-01 DIAGNOSIS — I25118 Atherosclerotic heart disease of native coronary artery with other forms of angina pectoris: Secondary | ICD-10-CM | POA: Diagnosis not present

## 2018-08-01 DIAGNOSIS — G8929 Other chronic pain: Secondary | ICD-10-CM | POA: Diagnosis not present

## 2018-08-03 DIAGNOSIS — E1142 Type 2 diabetes mellitus with diabetic polyneuropathy: Secondary | ICD-10-CM | POA: Diagnosis not present

## 2018-08-03 DIAGNOSIS — I70213 Atherosclerosis of native arteries of extremities with intermittent claudication, bilateral legs: Secondary | ICD-10-CM | POA: Diagnosis not present

## 2018-08-03 DIAGNOSIS — I119 Hypertensive heart disease without heart failure: Secondary | ICD-10-CM | POA: Diagnosis not present

## 2018-08-03 DIAGNOSIS — J449 Chronic obstructive pulmonary disease, unspecified: Secondary | ICD-10-CM | POA: Diagnosis not present

## 2018-08-03 DIAGNOSIS — M5416 Radiculopathy, lumbar region: Secondary | ICD-10-CM | POA: Diagnosis not present

## 2018-08-03 DIAGNOSIS — G8929 Other chronic pain: Secondary | ICD-10-CM | POA: Diagnosis not present

## 2018-08-03 DIAGNOSIS — I25118 Atherosclerotic heart disease of native coronary artery with other forms of angina pectoris: Secondary | ICD-10-CM | POA: Diagnosis not present

## 2018-08-03 DIAGNOSIS — I350 Nonrheumatic aortic (valve) stenosis: Secondary | ICD-10-CM | POA: Diagnosis not present

## 2018-08-03 DIAGNOSIS — Z48812 Encounter for surgical aftercare following surgery on the circulatory system: Secondary | ICD-10-CM | POA: Diagnosis not present

## 2018-08-04 ENCOUNTER — Encounter: Payer: Self-pay | Admitting: Family Medicine

## 2018-08-04 ENCOUNTER — Telehealth (INDEPENDENT_AMBULATORY_CARE_PROVIDER_SITE_OTHER): Payer: Self-pay

## 2018-08-04 ENCOUNTER — Ambulatory Visit (INDEPENDENT_AMBULATORY_CARE_PROVIDER_SITE_OTHER): Payer: Medicare HMO | Admitting: Family Medicine

## 2018-08-04 VITALS — BP 130/76 | HR 85 | Temp 97.7°F | Ht 61.0 in | Wt 214.8 lb

## 2018-08-04 DIAGNOSIS — G8929 Other chronic pain: Secondary | ICD-10-CM | POA: Diagnosis not present

## 2018-08-04 DIAGNOSIS — J449 Chronic obstructive pulmonary disease, unspecified: Secondary | ICD-10-CM | POA: Diagnosis not present

## 2018-08-04 DIAGNOSIS — I739 Peripheral vascular disease, unspecified: Secondary | ICD-10-CM | POA: Diagnosis not present

## 2018-08-04 DIAGNOSIS — Z48812 Encounter for surgical aftercare following surgery on the circulatory system: Secondary | ICD-10-CM | POA: Diagnosis not present

## 2018-08-04 DIAGNOSIS — E2839 Other primary ovarian failure: Secondary | ICD-10-CM

## 2018-08-04 DIAGNOSIS — I517 Cardiomegaly: Secondary | ICD-10-CM

## 2018-08-04 DIAGNOSIS — Z638 Other specified problems related to primary support group: Secondary | ICD-10-CM | POA: Diagnosis not present

## 2018-08-04 DIAGNOSIS — L89892 Pressure ulcer of other site, stage 2: Secondary | ICD-10-CM

## 2018-08-04 DIAGNOSIS — I25118 Atherosclerotic heart disease of native coronary artery with other forms of angina pectoris: Secondary | ICD-10-CM | POA: Diagnosis not present

## 2018-08-04 DIAGNOSIS — Z1239 Encounter for other screening for malignant neoplasm of breast: Secondary | ICD-10-CM | POA: Diagnosis not present

## 2018-08-04 DIAGNOSIS — I119 Hypertensive heart disease without heart failure: Secondary | ICD-10-CM | POA: Diagnosis not present

## 2018-08-04 DIAGNOSIS — E1142 Type 2 diabetes mellitus with diabetic polyneuropathy: Secondary | ICD-10-CM | POA: Diagnosis not present

## 2018-08-04 DIAGNOSIS — I350 Nonrheumatic aortic (valve) stenosis: Secondary | ICD-10-CM

## 2018-08-04 DIAGNOSIS — M5416 Radiculopathy, lumbar region: Secondary | ICD-10-CM | POA: Diagnosis not present

## 2018-08-04 DIAGNOSIS — I70213 Atherosclerosis of native arteries of extremities with intermittent claudication, bilateral legs: Secondary | ICD-10-CM | POA: Diagnosis not present

## 2018-08-04 NOTE — Assessment & Plan Note (Signed)
Explained the need for weight loss, pressure control

## 2018-08-04 NOTE — Progress Notes (Signed)
BP 130/76   Pulse 85   Temp 97.7 F (36.5 C)   Ht 5\' 1"  (1.549 m)   Wt 214 lb 12.8 oz (97.4 kg)   SpO2 94%   BMI 40.59 kg/m    Subjective:    Patient ID: Andrea Holden, female    DOB: 06/11/1946, 73 y.o.   MRN: [id number]  HPI: Andrea Holden is a 73 y.o. female  Chief Complaint  Patient presents with  . Follow-up    HPI Patient is here for follow-up  She asked how to get in touch with me; I explained calling the main number, system for getting messages to me and back to patient; we reviewed a phone note from July 07, 2018 in detail with her; she said that was fine If she ever feels overwhelmed, she wants to be able to come in; I explained that she still needs an appt It's still her daughter; had a great Christmas though, best in years Patient has talked about this to her friends and they say things won't change They went to [employer], husband blasted her about what she said to the girls Daughter and her husband are the same way She does not want to see a psychiatrist  Type 2 diabetes She refused to take her shoes off, wrapped, per CMA Not checking FSBS at home; some dry mouth occasionally; no blurred vision after eating; did have to increase reading glass strength Lab Results  Component Value Date   HGBA1C 6.8 (H) 04/23/2018   PVD; she had vascular procedure, has wound vac left groin; two stents put in; seeing vascular doctor only, Christoper Allegrapria is managing the wound vac; now that the muscles are getting oxygen and blood flow, maybe involving the sciatic; going to [facility] for different problem, injections in her back; feels like her leg is getting better; vascular has legs wrapped; she had a sore on the right side where they did the surgery; pressure got back from the infection, thought it was cellulitis, and then pressure created right side to open up, nurses are working on that and watching that; they are wrapping; no fevers Just finished the augmentin on  Friday  Her heart doctor... she was supposed to call a week ago to schedule an appointment Clot in the left carotid artery, left ventricle; checked her out before the surgery Has thickening of the heart walls, heart disease Reviewed last echo, mild aortic valve stenosis Mild concentric hypertrophy Severely calcified leaflets aortic valve, mild aortic stenosis  Gained weight; was 205 before hospital and now 214, but lots of fluid, not exercising  High cholesterol; on 10 of atorvastatin; she does like fatty meats, bacon and beef; she is trying to cut back on pork and sausage; not often; does eat cheese  Lab Results  Component Value Date   CHOL 132 06/05/2018   HDL 45 (L) 06/05/2018   LDLCALC 66 06/05/2018   TRIG 133 06/05/2018   CHOLHDL 2.9 06/05/2018     Depression screen PHQ 2/9 08/04/2018 06/05/2018 04/23/2018 12/12/2017 10/22/2017  Decreased Interest 0 3 0 0 1  Down, Depressed, Hopeless 3 3 1  0 1  PHQ - 2 Score 3 6 1  0 2  Altered sleeping 0 0 0 1 -  Tired, decreased energy 1 0 1 1 -  Change in appetite 3 0 0 0 -  Feeling bad or failure about yourself  3 1 0 0 -  Trouble concentrating 1 1 0 0 -  Moving slowly  or fidgety/restless 0 0 0 0 -  Suicidal thoughts 0 0 0 0 -  PHQ-9 Score 11 8 2 2  -  Difficult doing work/chores Not difficult at all Not difficult at all Somewhat difficult Not difficult at all -  Some recent data might be hidden   Fall Risk  08/04/2018 06/05/2018 04/23/2018 01/23/2018 12/12/2017  Falls in the past year? 1 1 Yes Yes Yes  Comment - - - - -  Number falls in past yr: 0 1 2 or more 1 1  Injury with Fall? 0 0 No (No Data) Yes  Comment - - - bruising on knee and mild abrasion -  Risk for fall due to : - - - History of fall(s) -  Risk for fall due to: Comment - - - - -  Follow up - - - - -    Relevant past medical, surgical, family and social history reviewed Past Medical History:  Diagnosis Date  . Allergy    seasonal allergies  . Anemia 2014   needed 5  units of blood d/t passing out, weak  . Anxiety   . Aortic stenosis 12/24/2017   Echo Aug 2018  . Arthritis   . Asthma    allergy induced asthma  . Cardiac murmur 12/12/2017  . Cataract   . Cataract    left  . Complication of anesthesia    arrhythmia following colonoscopy  . Degenerative disc disease, lumbar   . Degenerative disc disease, lumbar   . Depression   . Diabetes mellitus   . Diabetic neuropathy (HCC) 11/16/2015  . Dysrhythmia    stenosis of left ventricle  . GERD (gastroesophageal reflux disease)   . H/O transfusion    patient was given 5 units of blood while at [facility], blood type O+  . History of chicken pox   . History of hiatal hernia   . History of measles, mumps, or rubella   . HOH (hard of hearing)    does not use hearing aides yet  . Hyperlipidemia   . Hypertension   . Irregular heartbeat   . LVH (left ventricular hypertrophy) 12/24/2017   Echo Aug 2018  . Neuropathy   . Opiate use 11/16/2015  . Peripheral arterial disease (HCC) 05/06/2018   At rest, left > right; refer to vasc  . Pes planus of both feet 11/16/2015  . Plantar fasciitis of right foot 11/16/2015  . Wheezing    Past Surgical History:  Procedure Laterality Date  . ABDOMINAL HYSTERECTOMY    . APPLICATION OF WOUND VAC Left 07/23/2018   Procedure: APPLICATION OF WOUND VAC;  Surgeon: Annice Needy, MD;  Location: [facility] ORS;  Service: Vascular;  Laterality: Left;  . banck injections    . CATARACT EXTRACTION W/PHACO Right 05/30/2015   Procedure: CATARACT EXTRACTION PHACO AND INTRAOCULAR LENS PLACEMENT (IOC);  Surgeon: Galen Manila, MD;  Location: [facility] ORS;  Service: Ophthalmology;  Laterality: Right;  [place] 00:57 AP% 20.9 CDE 11.99 fluid pack lot #[id number]  . CHOLECYSTECTOMY  1970  . COLON SURGERY  2013   blocked colon  . COLONOSCOPY WITH PROPOFOL N/A 01/20/2018   Procedure: COLONOSCOPY WITH PROPOFOL;  Surgeon: Midge Minium, MD;  Location: [facility] ENDOSCOPY;  Service: Endoscopy;  Laterality: N/A;   . ENDARTERECTOMY FEMORAL Left 07/08/2018   Procedure: ENDARTERECTOMY FEMORAL;  Surgeon: Annice Needy, MD;  Location: [facility] ORS;  Service: Vascular;  Laterality: Left;  . EYE SURGERY Right    cataract surgery  . FOOT FUSION Right  2018   metal in foot  . GALLBLADDER SURGERY  1970  . GASTRIC BYPASS  2010   lost 178 lbs and regained 40 lbs last few years  . HUMERUS FRACTURE SURGERY Right    metal plate with screws  . internal bleeding  2016   ulcer in past  . LOWER EXTREMITY ANGIOGRAM Left 07/08/2018   Procedure: LOWER EXTREMITY ANGIOGRAM;  Surgeon: Annice Needyew, Jason S, MD;  Location: [facility] ORS;  Service: Vascular;  Laterality: Left;  . LOWER EXTREMITY ANGIOGRAPHY Left 05/27/2018   Procedure: LOWER EXTREMITY ANGIOGRAPHY;  Surgeon: Annice Needyew, Jason S, MD;  Location: [facility] INVASIVE CV LAB;  Service: Cardiovascular;  Laterality: Left;  . MOUTH SURGERY     root canals and crowns and extractions  . OVARY SURGERY    . SKIN GRAFT Right 2018   RT foot. foot has been rebuilt.  it is full of metal  . TENOTOMY ACHILLES TENDON Right    Percuntaneous. metal in foot  . TONSILLECTOMY    . WOUND DEBRIDEMENT Left 07/23/2018   Procedure: DEBRIDEMENT WOUND;  Surgeon: Annice Needyew, Jason S, MD;  Location: [facility] ORS;  Service: Vascular;  Laterality: Left;   Family History  Problem Relation Age of Onset  . Asthma Mother   . COPD Mother   . Arthritis Father   . Depression Father   . Heart disease Father   . Hypertension Father   . Stroke Father   . Heart attack Father   . Arthritis Brother   . Depression Brother   . Diabetes Brother   . Heart disease Brother   . Hyperlipidemia Brother   . Hypertension Brother   . Stroke Brother   . Vision loss Brother   . Heart attack Brother   . Diabetes Maternal Grandmother   . Thyroid disease Daughter   . Colitis Daughter   . Breast cancer Neg Hx    Social History   Tobacco Use  . Smoking status: Former Smoker    Packs/day: 0.50    Years: 55.00    Pack years: 27.50    Types:  Cigarettes    Start date: 07/01/1960    Last attempt to quit: 05/26/2018    Years since quitting: 0.1  . Smokeless tobacco: Never Used  . Tobacco comment: still using chantix  Substance Use Topics  . Alcohol use: Yes    Alcohol/week: 0.0 standard drinks    Comment: 2 drinks per month  . Drug use: Not Currently    Types: Marijuana    Comment: used for pain     [facility]  AUDIT-C Score  0      Interim medical history since last visit reviewed. Allergies and medications reviewed  Review of Systems Per HPI unless specifically indicated above     Objective:    BP 130/76   Pulse 85   Temp 97.7 F (36.5 C)   Ht 5\' 1"  (1.549 m)   Wt 214 lb 12.8 oz (97.4 kg)   SpO2 94%   BMI 40.59 kg/m   Wt Readings from Last 3 Encounters:  08/04/18 214 lb 12.8 oz (97.4 kg)  07/31/18 216 lb 6.4 oz (98.2 kg)  07/24/18 222 lb 14.2 oz (101.1 kg)    Physical Exam Constitutional:      General: She is not in acute distress.    Appearance: She is well-developed. She is obese. She is not diaphoretic.     Comments: Wound vac equipment beside patient, hose  leading to LEFT groin; morbidly obese  HENT:     Head: Normocephalic and atraumatic.  Eyes:     General: No scleral icterus. Neck:     Thyroid: No thyromegaly.  Cardiovascular:     Rate and Rhythm: Normal rate and regular rhythm.     Heart sounds: Murmur (radiates into carotids) present.  Pulmonary:     Effort: Pulmonary effort is normal. No respiratory distress.     Breath sounds: Normal breath sounds. No wheezing.  Abdominal:     General: Bowel sounds are normal. There is no distension.     Palpations: Abdomen is soft.  Skin:    General: Skin is warm and dry.     Coloration: Skin is not pale.  Neurological:     Mental Status: She is alert.  Psychiatric:        Behavior: Behavior normal.        Thought Content: Thought content normal.        Judgment: Judgment normal.          Assessment & Plan:   Problem List Items Addressed This Visit      Cardiovascular and Mediastinum   Peripheral arterial disease (HCC)    Status post balloon procedure with vascular      LVH (left ventricular hypertrophy) (Chronic)    Explained the need for weight loss, pressure control      Aortic stenosis (Chronic)    Reviewed echo findings; gave her number to cardiologist and she'll contact him        Endocrine   Diabetic neuropathy (HCC) (Chronic)    Patient declined taking shoes off today, feet wrapped; nurses checking      Relevant Orders   Ambulatory referral to Ophthalmology     Musculoskeletal and Integument   Pressure injury of skin    Managed by vascular and home health; patient's feet are wrapped, did not see today       Other Visit Diagnoses    Stress due to family tension    -  Primary   encouraged counseling, continue medicine; list of counselors given; explained the process for reaching me, best to schedule an appt if issues; cant just walkin   Screening for breast cancer       Relevant Orders   MM 3D SCREEN BREAST BILATERAL   Estrogen deficiency       Relevant Orders   DG Bone Density       Follow up plan: Return in about 3 months (around 10/26/2018) for follow-up visit with Dr. Sherie Don (or just after).  An after-visit summary was printed and given to the patient at check-out.  Please see the patient instructions which may contain other information and recommendations beyond what is mentioned above in the assessment and plan.  No orders of the defined types were placed in this encounter.   Orders Placed This Encounter  Procedures  . DG Bone Density  . MM 3D SCREEN BREAST BILATERAL  . Ambulatory referral to Ophthalmology

## 2018-08-04 NOTE — Patient Instructions (Addendum)
Please do see your eye doctor Please consider counseling You can reach Dr. Mariah MillingGollan at [phone] Please call him Check out the information at [email or web address] entitled "Nutrition for Weight Loss: What You Need to Know about Fad Diets" Try to lose between 1-2 pounds per week by taking in fewer calories and burning off more calories You can succeed by limiting portions, limiting foods dense in calories and fat, becoming more active, and drinking 8 glasses of water a day (64 ounces) Don't skip meals, especially breakfast, as skipping meals may alter your metabolism Do not use over-the-counter weight loss pills or gimmicks that claim rapid weight loss A healthy BMI (or body mass index) is between 18.5 and 24.9 You can calculate your ideal BMI at the NIH website [employer].huhttp://www.nhlbi.nih.gov/health/educational/lose_wt/BMI/bmicalc.htm

## 2018-08-04 NOTE — Assessment & Plan Note (Signed)
Managed by vascular and home health; patient's feet are wrapped, did not see today

## 2018-08-04 NOTE — Assessment & Plan Note (Signed)
Patient declined taking shoes off today, feet wrapped; nurses checking

## 2018-08-04 NOTE — Assessment & Plan Note (Signed)
Status post balloon procedure with vascular

## 2018-08-04 NOTE — Telephone Encounter (Signed)
Cicero Duck returned my call and wanted some clarification on the patient's Unna wraps. I stated that per Sheppard Plumber NP the patient is to have her Unna wraps changed once weekly.

## 2018-08-04 NOTE — Assessment & Plan Note (Signed)
Reviewed echo findings; gave her number to cardiologist and she'll contact him

## 2018-08-04 NOTE — Telephone Encounter (Signed)
Erika from [facility] called and left a message regarding questions about the patient's care. I attempted to contact Cicero Duck and had to leave a message for a return call.

## 2018-08-07 ENCOUNTER — Telehealth (INDEPENDENT_AMBULATORY_CARE_PROVIDER_SITE_OTHER): Payer: Self-pay

## 2018-08-07 ENCOUNTER — Telehealth (INDEPENDENT_AMBULATORY_CARE_PROVIDER_SITE_OTHER): Payer: Self-pay | Admitting: Nurse Practitioner

## 2018-08-07 DIAGNOSIS — G8929 Other chronic pain: Secondary | ICD-10-CM | POA: Diagnosis not present

## 2018-08-07 DIAGNOSIS — J449 Chronic obstructive pulmonary disease, unspecified: Secondary | ICD-10-CM | POA: Diagnosis not present

## 2018-08-07 DIAGNOSIS — I70213 Atherosclerosis of native arteries of extremities with intermittent claudication, bilateral legs: Secondary | ICD-10-CM | POA: Diagnosis not present

## 2018-08-07 DIAGNOSIS — E1142 Type 2 diabetes mellitus with diabetic polyneuropathy: Secondary | ICD-10-CM | POA: Diagnosis not present

## 2018-08-07 DIAGNOSIS — I25118 Atherosclerotic heart disease of native coronary artery with other forms of angina pectoris: Secondary | ICD-10-CM | POA: Diagnosis not present

## 2018-08-07 DIAGNOSIS — Z48812 Encounter for surgical aftercare following surgery on the circulatory system: Secondary | ICD-10-CM | POA: Diagnosis not present

## 2018-08-07 DIAGNOSIS — M5416 Radiculopathy, lumbar region: Secondary | ICD-10-CM | POA: Diagnosis not present

## 2018-08-07 DIAGNOSIS — I119 Hypertensive heart disease without heart failure: Secondary | ICD-10-CM | POA: Diagnosis not present

## 2018-08-07 DIAGNOSIS — I350 Nonrheumatic aortic (valve) stenosis: Secondary | ICD-10-CM | POA: Diagnosis not present

## 2018-08-07 NOTE — Telephone Encounter (Signed)
Called patient and she will be calling [facility] because she only meant to cancel the visit today. She will do the PT but today she had another obligation and could not

## 2018-08-07 NOTE — Telephone Encounter (Signed)
Andrea Holden is aware, she stated that she went over the care and things with the patient. She said that the sponge for the wound was to large and would touch healthy skin which is not what they want. She will call back with any worsening's or need for order change. Patient has an appointment on 08/14/2018

## 2018-08-07 NOTE — Telephone Encounter (Signed)
See below

## 2018-08-07 NOTE — Telephone Encounter (Signed)
Elizabeth from [facility] called and left a message on the triage line and stated that she is out to change the patients wound vac but it is healing nicely and not much open on the wound. She would like to get the verbal ok to switch to using wet to dry wraps daily. Please advise

## 2018-08-10 DIAGNOSIS — I350 Nonrheumatic aortic (valve) stenosis: Secondary | ICD-10-CM | POA: Diagnosis not present

## 2018-08-10 DIAGNOSIS — M5416 Radiculopathy, lumbar region: Secondary | ICD-10-CM | POA: Diagnosis not present

## 2018-08-10 DIAGNOSIS — Z48812 Encounter for surgical aftercare following surgery on the circulatory system: Secondary | ICD-10-CM | POA: Diagnosis not present

## 2018-08-10 DIAGNOSIS — E1142 Type 2 diabetes mellitus with diabetic polyneuropathy: Secondary | ICD-10-CM | POA: Diagnosis not present

## 2018-08-10 DIAGNOSIS — I25118 Atherosclerotic heart disease of native coronary artery with other forms of angina pectoris: Secondary | ICD-10-CM | POA: Diagnosis not present

## 2018-08-10 DIAGNOSIS — J449 Chronic obstructive pulmonary disease, unspecified: Secondary | ICD-10-CM | POA: Diagnosis not present

## 2018-08-10 DIAGNOSIS — I119 Hypertensive heart disease without heart failure: Secondary | ICD-10-CM | POA: Diagnosis not present

## 2018-08-10 DIAGNOSIS — I70213 Atherosclerosis of native arteries of extremities with intermittent claudication, bilateral legs: Secondary | ICD-10-CM | POA: Diagnosis not present

## 2018-08-10 DIAGNOSIS — G8929 Other chronic pain: Secondary | ICD-10-CM | POA: Diagnosis not present

## 2018-08-11 DIAGNOSIS — I119 Hypertensive heart disease without heart failure: Secondary | ICD-10-CM | POA: Diagnosis not present

## 2018-08-11 DIAGNOSIS — J449 Chronic obstructive pulmonary disease, unspecified: Secondary | ICD-10-CM | POA: Diagnosis not present

## 2018-08-11 DIAGNOSIS — M5416 Radiculopathy, lumbar region: Secondary | ICD-10-CM | POA: Diagnosis not present

## 2018-08-11 DIAGNOSIS — I350 Nonrheumatic aortic (valve) stenosis: Secondary | ICD-10-CM | POA: Diagnosis not present

## 2018-08-11 DIAGNOSIS — I25118 Atherosclerotic heart disease of native coronary artery with other forms of angina pectoris: Secondary | ICD-10-CM | POA: Diagnosis not present

## 2018-08-11 DIAGNOSIS — Z48812 Encounter for surgical aftercare following surgery on the circulatory system: Secondary | ICD-10-CM | POA: Diagnosis not present

## 2018-08-11 DIAGNOSIS — E1142 Type 2 diabetes mellitus with diabetic polyneuropathy: Secondary | ICD-10-CM | POA: Diagnosis not present

## 2018-08-11 DIAGNOSIS — G8929 Other chronic pain: Secondary | ICD-10-CM | POA: Diagnosis not present

## 2018-08-11 DIAGNOSIS — I70213 Atherosclerosis of native arteries of extremities with intermittent claudication, bilateral legs: Secondary | ICD-10-CM | POA: Diagnosis not present

## 2018-08-12 DIAGNOSIS — G8929 Other chronic pain: Secondary | ICD-10-CM | POA: Diagnosis not present

## 2018-08-12 DIAGNOSIS — J449 Chronic obstructive pulmonary disease, unspecified: Secondary | ICD-10-CM | POA: Diagnosis not present

## 2018-08-12 DIAGNOSIS — E1142 Type 2 diabetes mellitus with diabetic polyneuropathy: Secondary | ICD-10-CM | POA: Diagnosis not present

## 2018-08-12 DIAGNOSIS — M5416 Radiculopathy, lumbar region: Secondary | ICD-10-CM | POA: Diagnosis not present

## 2018-08-12 DIAGNOSIS — I25118 Atherosclerotic heart disease of native coronary artery with other forms of angina pectoris: Secondary | ICD-10-CM | POA: Diagnosis not present

## 2018-08-12 DIAGNOSIS — Z48812 Encounter for surgical aftercare following surgery on the circulatory system: Secondary | ICD-10-CM | POA: Diagnosis not present

## 2018-08-12 DIAGNOSIS — I350 Nonrheumatic aortic (valve) stenosis: Secondary | ICD-10-CM | POA: Diagnosis not present

## 2018-08-12 DIAGNOSIS — I119 Hypertensive heart disease without heart failure: Secondary | ICD-10-CM | POA: Diagnosis not present

## 2018-08-12 DIAGNOSIS — I70213 Atherosclerosis of native arteries of extremities with intermittent claudication, bilateral legs: Secondary | ICD-10-CM | POA: Diagnosis not present

## 2018-08-13 ENCOUNTER — Telehealth: Payer: Self-pay

## 2018-08-13 DIAGNOSIS — T8189XA Other complications of procedures, not elsewhere classified, initial encounter: Secondary | ICD-10-CM | POA: Diagnosis not present

## 2018-08-14 ENCOUNTER — Ambulatory Visit (INDEPENDENT_AMBULATORY_CARE_PROVIDER_SITE_OTHER): Payer: Medicare HMO

## 2018-08-14 ENCOUNTER — Other Ambulatory Visit: Payer: Self-pay

## 2018-08-14 ENCOUNTER — Ambulatory Visit (INDEPENDENT_AMBULATORY_CARE_PROVIDER_SITE_OTHER): Payer: Medicare HMO | Admitting: Nurse Practitioner

## 2018-08-14 ENCOUNTER — Encounter (INDEPENDENT_AMBULATORY_CARE_PROVIDER_SITE_OTHER): Payer: Self-pay | Admitting: Nurse Practitioner

## 2018-08-14 VITALS — BP 206/68 | HR 66 | Resp 14 | Ht 61.0 in | Wt 214.0 lb

## 2018-08-14 DIAGNOSIS — E782 Mixed hyperlipidemia: Secondary | ICD-10-CM

## 2018-08-14 DIAGNOSIS — I119 Hypertensive heart disease without heart failure: Secondary | ICD-10-CM

## 2018-08-14 DIAGNOSIS — T8149XA Infection following a procedure, other surgical site, initial encounter: Secondary | ICD-10-CM

## 2018-08-14 DIAGNOSIS — K219 Gastro-esophageal reflux disease without esophagitis: Secondary | ICD-10-CM

## 2018-08-14 DIAGNOSIS — Z598 Other problems related to housing and economic circumstances: Secondary | ICD-10-CM

## 2018-08-14 DIAGNOSIS — Z599 Problem related to housing and economic circumstances, unspecified: Secondary | ICD-10-CM

## 2018-08-14 DIAGNOSIS — I517 Cardiomegaly: Secondary | ICD-10-CM

## 2018-08-14 MED ORDER — SILVER SULFADIAZINE 1 % EX CREA: 1.0000 "application " | TOPICAL_CREAM | Freq: Every day | CUTANEOUS | 0 refills | Status: DC

## 2018-08-14 NOTE — Patient Instructions (Signed)
Thank you allowing the Chronic Care Management Team to be a part of your care! It was a pleasure speaking with you today!  1. Continue to check your blood pressure daily and record. We will set up your new machine next week. 2. Continue to change your wound dressing daily as directed. Report any signs/symptoms of infection 3. Continue to work with Physical therapy as prescribed. 4. Please bring your fitbit and new BP cuff to your appointment with CCM RN CM next week 5. Take all your medications as prescribed and attend all scheduled appointments.  CCM (Chronic Care Management) Team   Yvone Neu RN, BSN Nurse Care Coordinator  [phone]  Karalee Height PharmD  Clinical Pharmacist  [phone]   Goals Addressed            This Visit's Progress   . "I really need assistance with all these mediical bills" (pt-stated)   On track    Current Barriers:  Marland Kitchen Multiple medical bills . Finances  Nurse Case Manager Clinical Goal(s): Over the next 14 days, patient will report completion of application for financial assistance and placing in mail  Interventions:  . Provided with [facility] Application and assisted with completion . Reviewed application and documents need for application completion (power bill for proof of residence, last 3 month bank statements)  Patient Self Care Activities:  . Complete [place] financial application and mail to:  [facility] office ATTN: Customer Service   [address] [place] [address]   *initial goal documentation     . "i want to get back to running in the dog ring" (pt-stated)       Current Barriers:  . Current health status . Lack of stamina and strength   Nurse Case Manager Clinical Goal(s): Over the next 3 months, patient will verbalize engaging in silver sneakers to improve stamina and strength  Interventions:  . Reviewed patients benefits through [employer] . Provided patient with  gym options that utilize silver sneakers   Patient Self Care Activities:  . Patient will explore options for exercise once wound removed . Patient will engage in exercise to improve stamina and strength   *initial goal documentation      . COMPLETED: "I want to get rid of the wound vac" (pt-stated)       Current Barriers:  . Poor wound healing/infection post vascular surgery  Nurse Case Manager Clinical Goal(s): Over the next 14 days, patient will report improvement in wound healing as assessed by home health nurse.  Interventions:  . Provided emotional support and reassurance . Provided example of diet that promotes wound healing (EMMI Education)  Patient Self Care Activities:  . Patient will increase protein in her diet  . Patient will engage in self care activities/plan of care as prescribed by vascular surgeon and [facility] nurse  *initial goal documentation      . "this financial stress is making my BP increase" (pt-stated)   On track    Current Barriers:  Marland Kitchen Knowledge deficit secondary to effects of stress on BP . Knowledge deficit related to HTN self care   Nurse Case Manager Clinical Goal(s): Over the next 2 weeks, patient will check and record BP 3 days a week and report numbers to case manager during next encounter  Interventions:  . Basic HTN education provided including effects on cardiac function . Provided patient with [facility] Calendar to record BP . Discussed balancing hyponatrimia and HTN with increased  sodium intake  Patient Self Care Activities:  . Patient will check BP weekly and record   *initial goal documentation         The patient verbalized understanding of instructions provided today and declined a print copy of patient instruction materials.   Face to Face appointment with CCM team member scheduled foR:  08/20/2018 at 9:00

## 2018-08-14 NOTE — Chronic Care Management (AMB) (Signed)
Chronic Care Management   Follow Up Note   08/14/2018 Name: Andrea Holden MRN: [id number] DOB: 1946-06-20  Referred by: Arnetha Courser, MD Reason for referral : Chronic Care Management (2 week follow up)   Subjective: "I have been checking my blood pressure and writing it down like you asked" "My surgeon says my wound is healing very fast"   Objective:  BP Readings from Last 3 Encounters:  08/14/18 (!) 206/68  08/04/18 130/76  07/31/18 (!) 171/72    Assessment: Andrea Holden a 73 y.o.year old femalewho sees Lada, Satira Anis, MDfor primary care. Angel Little, THN Transitions of Care RNasked the CCM team to consult the patient for assistance with chronic disease management related to multiple chronic diseases. Referral was placed1/13/19. Today Andrea Holden met with the CCM Team to establish health goals and discuss needs.  Goals Addressed            This Visit's Progress   . COMPLETED: "I really need assistance with all these mediical bills" (pt-stated)   On track    [name] has completed her financial assistance forms and placed them in the mail. She is awaiting a response.   Current Barriers:  Marland Kitchen Multiple medical bills . Finances  Nurse Case Manager Clinical Goal(s): Over the next 14 days, patient will report completion of application for financial assistance and placing in mail  Interventions:  . Provided with [facility] Application and assisted with completion . Reviewed application and documents need for application completion (power bill for proof of residence, last 3 month bank statements)  Patient Self Care Activities:  . Complete [place] financial application and mail to:  [facility] office ATTN: Customer Service   [place] [phone]   *initial goal documentation     . "i want to get back to running in the dog ring" (pt-stated)       Andrea Holden is currently working with home  health PT to rebuild her stamina and strength. She will not enroll in a formal exercise program such as silver sneakers until she has had her surgery on the right side.  Current Barriers:  . Current health status . Lack of stamina and strength   Nurse Case Manager Clinical Goal(s): Over the next 3 months, patient will verbalize engaging in silver sneakers to improve stamina and strength  Interventions:  . Reviewed patients benefits through [employer] . Provided patient with gym options that utilize silver sneakers   Patient Self Care Activities:  . Patient will explore options for exercise once wound removed . Patient will engage in exercise to improve stamina and strength   *initial goal documentation      . COMPLETED: "I want to get rid of the wound vac" (pt-stated)       Andrea Holden had her wound vac removed last week. She continues to receive home health for dressing changes twice weekly. Orders are to change daily and Ms. Trantham is compliant with independent dressing changes on days home health is not available. Andrea Holden states she had an appointment with her vascular surgeon today and was told her left groin wound is healing faster than anticipated. She states she is to return to her surgeon in 2 weeks and if she has continued to heal without complications, she will schedule revascularization of her right femoral artery.  Current Barriers:  . Poor wound healing/infection post vascular surgery  Nurse Case Manager Clinical Goal(s): Over the  next 14 days, patient will report improvement in wound healing as assessed by home health nurse.  Interventions:  . Provided emotional support and reassurance . Provided example of diet that promotes wound healing (EMMI Education)  Patient Self Care Activities:  . Patient will increase protein in her diet  . Patient will engage in self care activities/plan of care as prescribed by vascular surgeon and [facility] nurse  *initial  goal documentation      . "this financial stress is making my BP increase" (pt-stated)   On track    Andrea Holden continues to struggle with an elevated BP intermittently. Recent BP at MD appointment 200 however at home she is getting readings of 130s-140s. She is unsure if her personal wrist monitor is accurate. She has been provided a new digital cuff and fitbit through her [employer] O[employer] benefit. She has requested an appointment with CCM RN CM for education and set up of her new BP monitor.    Current Barriers:  Marland Kitchen Knowledge deficit secondary to effects of stress on BP . Knowledge deficit related to HTN self care   Nurse Case Manager Clinical Goal(s): Over the next 2 weeks, patient will check and record BP 3 days a week and report numbers to case manager during next encounter  Interventions:  . Basic HTN education provided including effects on cardiac function . Provided patient with [facility] Calendar to record BP . Discussed balancing hyponatrimia and HTN with increased sodium intake  Patient Self Care Activities:  . Patient will check BP weekly and record   *initial goal documentation     Plan:Will follow up with patient next week during scheduled face to face    Jennesis Ramaswamy E. Rollene Rotunda, RN, BSN Nurse Care Coordinator [facility] Care Management  [phone]

## 2018-08-17 ENCOUNTER — Ambulatory Visit: Payer: Self-pay

## 2018-08-18 DIAGNOSIS — M5416 Radiculopathy, lumbar region: Secondary | ICD-10-CM | POA: Diagnosis not present

## 2018-08-18 DIAGNOSIS — I350 Nonrheumatic aortic (valve) stenosis: Secondary | ICD-10-CM | POA: Diagnosis not present

## 2018-08-18 DIAGNOSIS — I25118 Atherosclerotic heart disease of native coronary artery with other forms of angina pectoris: Secondary | ICD-10-CM | POA: Diagnosis not present

## 2018-08-18 DIAGNOSIS — I70213 Atherosclerosis of native arteries of extremities with intermittent claudication, bilateral legs: Secondary | ICD-10-CM | POA: Diagnosis not present

## 2018-08-18 DIAGNOSIS — E1142 Type 2 diabetes mellitus with diabetic polyneuropathy: Secondary | ICD-10-CM | POA: Diagnosis not present

## 2018-08-18 DIAGNOSIS — J449 Chronic obstructive pulmonary disease, unspecified: Secondary | ICD-10-CM | POA: Diagnosis not present

## 2018-08-18 DIAGNOSIS — G8929 Other chronic pain: Secondary | ICD-10-CM | POA: Diagnosis not present

## 2018-08-18 DIAGNOSIS — Z48812 Encounter for surgical aftercare following surgery on the circulatory system: Secondary | ICD-10-CM | POA: Diagnosis not present

## 2018-08-18 DIAGNOSIS — I119 Hypertensive heart disease without heart failure: Secondary | ICD-10-CM | POA: Diagnosis not present

## 2018-08-19 ENCOUNTER — Ambulatory Visit: Payer: Self-pay

## 2018-08-19 NOTE — Chronic Care Management (AMB) (Addendum)
  Chronic Care Management   Note  08/19/2018 Name: Andrea Holden MRN: [id number] DOB: [birth year]  Care Coordination: Telephone outreach to Ms. Ariyan Knobloch discuss option for rescheduling face to face tomorrow related to possible inclimate weather. Was unable to reach patient via telephone and left a HIPAA compliant VM message requesting call back if patient wishes to reschedule.  Addendum: Incoming call from Ms. Folker received this afternoon. Ms. Quiram states she would like to reschedule tomorrows appointment with the CCM RN CM. Appointment has been rescheduled for Monday 08/24/2018 at 9:00.   Muhannad Bignell E. Suzie Portela, RN, BSN Nurse Care Coordinator [facility] Care Management  [phone]

## 2018-08-20 ENCOUNTER — Ambulatory Visit: Payer: Self-pay

## 2018-08-20 DIAGNOSIS — G8929 Other chronic pain: Secondary | ICD-10-CM | POA: Diagnosis not present

## 2018-08-20 DIAGNOSIS — M5416 Radiculopathy, lumbar region: Secondary | ICD-10-CM | POA: Diagnosis not present

## 2018-08-20 DIAGNOSIS — E1142 Type 2 diabetes mellitus with diabetic polyneuropathy: Secondary | ICD-10-CM | POA: Diagnosis not present

## 2018-08-20 DIAGNOSIS — Z48812 Encounter for surgical aftercare following surgery on the circulatory system: Secondary | ICD-10-CM | POA: Diagnosis not present

## 2018-08-20 DIAGNOSIS — I70213 Atherosclerosis of native arteries of extremities with intermittent claudication, bilateral legs: Secondary | ICD-10-CM | POA: Diagnosis not present

## 2018-08-20 DIAGNOSIS — I350 Nonrheumatic aortic (valve) stenosis: Secondary | ICD-10-CM | POA: Diagnosis not present

## 2018-08-20 DIAGNOSIS — I119 Hypertensive heart disease without heart failure: Secondary | ICD-10-CM | POA: Diagnosis not present

## 2018-08-20 DIAGNOSIS — J449 Chronic obstructive pulmonary disease, unspecified: Secondary | ICD-10-CM | POA: Diagnosis not present

## 2018-08-20 DIAGNOSIS — I25118 Atherosclerotic heart disease of native coronary artery with other forms of angina pectoris: Secondary | ICD-10-CM | POA: Diagnosis not present

## 2018-08-21 DIAGNOSIS — H2512 Age-related nuclear cataract, left eye: Secondary | ICD-10-CM | POA: Diagnosis not present

## 2018-08-21 LAB — HM DIABETES EYE EXAM

## 2018-08-23 DIAGNOSIS — T8131XA Disruption of external operation (surgical) wound, not elsewhere classified, initial encounter: Secondary | ICD-10-CM | POA: Diagnosis not present

## 2018-08-24 ENCOUNTER — Ambulatory Visit: Payer: Medicare HMO

## 2018-08-24 DIAGNOSIS — M5416 Radiculopathy, lumbar region: Secondary | ICD-10-CM | POA: Diagnosis not present

## 2018-08-24 DIAGNOSIS — I517 Cardiomegaly: Secondary | ICD-10-CM

## 2018-08-24 DIAGNOSIS — I119 Hypertensive heart disease without heart failure: Secondary | ICD-10-CM

## 2018-08-24 DIAGNOSIS — I739 Peripheral vascular disease, unspecified: Secondary | ICD-10-CM

## 2018-08-24 DIAGNOSIS — Z48812 Encounter for surgical aftercare following surgery on the circulatory system: Secondary | ICD-10-CM | POA: Diagnosis not present

## 2018-08-24 DIAGNOSIS — I25118 Atherosclerotic heart disease of native coronary artery with other forms of angina pectoris: Secondary | ICD-10-CM | POA: Diagnosis not present

## 2018-08-24 DIAGNOSIS — G8929 Other chronic pain: Secondary | ICD-10-CM | POA: Diagnosis not present

## 2018-08-24 DIAGNOSIS — E1142 Type 2 diabetes mellitus with diabetic polyneuropathy: Secondary | ICD-10-CM | POA: Diagnosis not present

## 2018-08-24 DIAGNOSIS — I350 Nonrheumatic aortic (valve) stenosis: Secondary | ICD-10-CM | POA: Diagnosis not present

## 2018-08-24 DIAGNOSIS — I70213 Atherosclerosis of native arteries of extremities with intermittent claudication, bilateral legs: Secondary | ICD-10-CM | POA: Diagnosis not present

## 2018-08-24 DIAGNOSIS — J449 Chronic obstructive pulmonary disease, unspecified: Secondary | ICD-10-CM | POA: Diagnosis not present

## 2018-08-24 NOTE — Chronic Care Management (AMB) (Signed)
Chronic Care Management   Follow Up Note   08/24/2018 Name: Andrea Holden MRN: [id number] DOB: 01-Nov-1945  Referred by: Arnetha Courser, MD Reason for referral : Chronic Care Management (assistance with Fitbit and BP monitor setup)   Subjective: "I am ready to have surgery on my other leg so I can get back to being active" "I have been checking my blood pressure with my old wrist monitor like you have asked"   Objective:  BP Readings from Last 3 Encounters:  08/14/18 (!) 206/68  08/04/18 130/76  07/31/18 (!) 171/72    Assessment: Andrea J Bromaghimis a 73 y.o.year old femalewho sees Lada, Satira Anis, MDfor primary care. Andrea Holden, [facility] Transitions of Care RNasked the CCM team to consult the patient for assistance with chronic disease management related to multiple chronic diseases. Referral was placed1/13/19. Andrea Holden met with the CCM Team 08/14/2018 to establish health goals and discuss needs. Today patient presents to a follow face to face encounter to discuss BP home readings and to receive assistance from [facility] RN CM with setting up Fitbit and new home BP monitor.   Goals Addressed            This Visit's Progress   . "i want to get back to running in the dog ring" (pt-stated)       Current Barriers:  . Current health status . Lack of stamina and strength   Nurse Case Manager Clinical Goal(s): Over the next 3 months, patient will verbalize engaging in silver sneakers to improve stamina and strength  Interventions:  . Assisted patient with Fitbit activity tracker set up on mobile device and assisted with activity goal setting   Patient Self Care Activities:  . Patient will explore options for exercise once wound removed . Patient will engage in exercise to improve stamina and strength . Patient will continue to work with PT . Patient will discuss activity goals with PT during today's session.   Please see past updates in goals section for  documentation of goal progression      . 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 She has applied for financial assistance with her Holden medical bills. She is on the special needs plan with [facility] and receives 400.00/quarter of OTC support. This is how she recently received her new Fitbit and BP monitor. She is checking her BP twice daily and the highest BP was 154. This is much improved from the readings that have recently been documented in her chart.   Current Barriers:  Marland Kitchen Knowledge deficit secondary to effects of stress on BP . Knowledge deficit related to HTN self care   Nurse Case Manager Clinical Goal(s): Over the next 2 weeks, patient will check and record BP 3 days a week and report numbers to case manager during next encounter  Interventions:  . Basic HTN education provided including effects on cardiac function . Provided patient with [facility] Calendar to record BP . Discussed balancing hyponatrimia and HTN with increased sodium intake  Patient Self Care Activities:  . Patient will check BP weekly and record   *initial goal documentation      . i need help setting up my new Fitbit and my new BP monitor (pt-stated)       Current Barriers:  . Lacks caregiver  support.  . Knowledge Deficits related to understanding technology of Fitbit and how to set up  Nurse Case Manager Clinical Goal(s):  Marland Kitchen Over the next 14 days, patient will demonstrate improved health management independence as evidenced byutilizing her new BP cuff and Fitbit for monitoring activity, HR, and BP  Interventions:  . Provided education to patient re: utilization of Fitbit and importance of setting activity goals to decrease sedentary lifestyle . Advised patient, providing education and rationale, to monitor blood pressure daily  and record, calling Dr. Sanda Klein for findings outside established parameters.   Patient Self Care Activities:  . Increase daily activity by monitoring "steps" . Check and record BP daily  Plan:  . RNCM will provide technology support as needed . Patient will contact the CCM Team with any questions regarding technology reviewed today  *initial goal documentation         Telephone follow up appointment with CCM team member scheduled for: 2 weeks     Dannell Gortney E. Rollene Rotunda, RN, BSN Nurse Care Coordinator [facility] Care Management  [phone]

## 2018-08-24 NOTE — Patient Instructions (Signed)
Thank you allowing the Chronic Care Management Team to be a part of your care! It was a pleasure speaking with you today!  1. Please call [employer] medicare and discuss recent purchase of BP monitor through OTC and tell them it is not working. 2. Discuss your activity levels with PT today and see if he has suggestions for goal settings 3. Continue to check your BP daily and record. You can use your wrist monitor as you have been until you receive your new monitor. 4. I will discuss your medication questions with Raynelle Fanning and have her to call you. 5. Continue to take all medications as prescribed. 6. I will follow up with you via telephone in 2 weeks  CCM (Chronic Care Management) Team   Yvone Neu RN, BSN Nurse Care Coordinator  [phone]  Karalee Height PharmD  Clinical Pharmacist  [phone]   Goals Addressed            This Visit's Progress   . "i want to get back to running in the dog ring" (pt-stated)       Current Barriers:  . Current health status . Lack of stamina and strength   Nurse Case Manager Clinical Goal(s): Over the next 3 months, patient will verbalize engaging in silver sneakers to improve stamina and strength  Interventions:  . Assisted patient with Fitbit activity tracker set up on mobile device and assisted with activity goal setting   Patient Self Care Activities:  . Patient will explore options for exercise once wound removed . Patient will engage in exercise to improve stamina and strength . Patient will continue to work with PT . Patient will discuss activity goals with PT during today's session.   Please see past updates in goals section for documentation of goal progression      . COMPLETED: "this financial stress is making my BP increase" (pt-stated)       Current Barriers:  Marland Kitchen Knowledge deficit secondary to effects of stress on BP . Knowledge deficit related to HTN self care   Nurse Case Manager Clinical Goal(s): Over the next 2  weeks, patient will check and record BP 3 days a week and report numbers to case manager during next encounter  Interventions:  . Basic HTN education provided including effects on cardiac function . Provided patient with [facility] Calendar to record BP . Discussed balancing hyponatrimia and HTN with increased sodium intake  Patient Self Care Activities:  . Patient will check BP weekly and record   *initial goal documentation      . i need help setting up my new Fitbit and my new BP monitor (pt-stated)       Current Barriers:  . Lacks caregiver support.  . Knowledge Deficits related to understanding technology of Fitbit and how to set up  Nurse Case Manager Clinical Goal(s):  Marland Kitchen Over the next 14 days, patient will demonstrate improved health management independence as evidenced byutilizing her new BP cuff and Fitbit for monitoring activity, HR, and BP  Interventions:  . Provided education to patient re: utilization of Fitbit and importance of setting activity goals to decrease sedentary lifestyle . Advised patient, providing education and rationale, to monitor blood pressure daily and record, calling Dr. Sherie Don for findings outside established parameters.   Patient Self Care Activities:  . Increase daily activity by monitoring "steps" . Check and record BP daily  Plan:  . RNCM will provide technology support as needed . Patient will contact the CCM Team with  any questions regarding technology reviewed today  *initial goal documentation        The patient verbalized understanding of instructions provided today and declined a print copy of patient instruction materials.   Telephone follow up appointment with CCM team member scheduled for: 2 weeks

## 2018-08-25 ENCOUNTER — Encounter (INDEPENDENT_AMBULATORY_CARE_PROVIDER_SITE_OTHER): Payer: Self-pay | Admitting: Nurse Practitioner

## 2018-08-25 NOTE — Progress Notes (Signed)
SUBJECTIVE:  Patient ID: Andrea Holden, female    DOB: 02/16/1946, 73 y.o.   MRN: [id number] Chief Complaint  Patient presents with  . Follow-up    HPI  Andrea Holden is a 73 y.o. female that presents today for evaluation of wound on her left groin following femoral endarterectomy.  The wound subsequently dehisced and had foul-smelling purulent drainage coming from the wound.  She was taken to the OR to have her wound debrided and have a wound VAC placed.  Today, the wound is very nearly healed incompletely approximated with just a small amount of tissue exposed.  The wound VAC has been removed at this point.  The patient states that health is now only coming 2 days a week.  Otherwise she is dressing the wound herself.  Patient denies any drainage or pain since wound VAC is been removed.  Patient also states that her bilateral swelling is greatly improved and she is no longer in Unna wraps either.  Overall the patient states she feels good.  Past Medical History:  Diagnosis Date  . Allergy    seasonal allergies  . Anemia 2014   needed 5 units of blood d/t passing out, weak  . Anxiety   . Aortic stenosis 12/24/2017   Echo Aug 2018  . Arthritis   . Asthma    allergy induced asthma  . Cardiac murmur 12/12/2017  . Cataract   . Cataract    left  . Complication of anesthesia    arrhythmia following colonoscopy  . Degenerative disc disease, lumbar   . Degenerative disc disease, lumbar   . Depression   . Diabetes mellitus   . Diabetic neuropathy (HCC) 11/16/2015  . Dysrhythmia    stenosis of left ventricle  . GERD (gastroesophageal reflux disease)   . H/O transfusion    patient was given 5 units of blood while at [facility], blood type O+  . History of chicken pox   . History of hiatal hernia   . History of measles, mumps, or rubella   . HOH (hard of hearing)    does not use hearing aides yet  . Hyperlipidemia   . Hypertension   . Irregular heartbeat   . LVH (left  ventricular hypertrophy) 12/24/2017   Echo Aug 2018  . Neuropathy   . Opiate use 11/16/2015  . Peripheral arterial disease (HCC) 05/06/2018   At rest, left > right; refer to vasc  . Pes planus of both feet 11/16/2015  . Plantar fasciitis of right foot 11/16/2015  . Wheezing     Past Surgical History:  Procedure Laterality Date  . ABDOMINAL HYSTERECTOMY    . APPLICATION OF WOUND VAC Left 07/23/2018   Procedure: APPLICATION OF WOUND VAC;  Surgeon: Annice Needy, MD;  Location: [facility] ORS;  Service: Vascular;  Laterality: Left;  . banck injections    . CATARACT EXTRACTION W/PHACO Right 05/30/2015   Procedure: CATARACT EXTRACTION PHACO AND INTRAOCULAR LENS PLACEMENT (IOC);  Surgeon: Galen Manila, MD;  Location: [facility] ORS;  Service: Ophthalmology;  Laterality: Right;  [place] 00:57 AP% 20.9 CDE 11.99 fluid pack lot #[id number]  . CHOLECYSTECTOMY  1970  . COLON SURGERY  2013   blocked colon  . COLONOSCOPY WITH PROPOFOL N/A 01/20/2018   Procedure: COLONOSCOPY WITH PROPOFOL;  Surgeon: Midge Minium, MD;  Location: [facility] ENDOSCOPY;  Service: Endoscopy;  Laterality: N/A;  . ENDARTERECTOMY FEMORAL Left 07/08/2018   Procedure: ENDARTERECTOMY FEMORAL;  Surgeon: Annice Needy, MD;  Location: [facility]  ORS;  Service: Vascular;  Laterality: Left;  . EYE SURGERY Right    cataract surgery  . FOOT FUSION Right 2018   metal in foot  . GALLBLADDER SURGERY  1970  . GASTRIC BYPASS  2010   lost 178 lbs and regained 40 lbs last few years  . HUMERUS FRACTURE SURGERY Right    metal plate with screws  . internal bleeding  2016   ulcer in past  . LOWER EXTREMITY ANGIOGRAM Left 07/08/2018   Procedure: LOWER EXTREMITY ANGIOGRAM;  Surgeon: Annice Needy, MD;  Location: [facility] ORS;  Service: Vascular;  Laterality: Left;  . LOWER EXTREMITY ANGIOGRAPHY Left 05/27/2018   Procedure: LOWER EXTREMITY ANGIOGRAPHY;  Surgeon: Annice Needy, MD;  Location: [facility] INVASIVE CV LAB;  Service: Cardiovascular;  Laterality: Left;  . MOUTH SURGERY      root canals and crowns and extractions  . OVARY SURGERY    . SKIN GRAFT Right 2018   RT foot. foot has been rebuilt.  it is full of metal  . TENOTOMY ACHILLES TENDON Right    Percuntaneous. metal in foot  . TONSILLECTOMY    . WOUND DEBRIDEMENT Left 07/23/2018   Procedure: DEBRIDEMENT WOUND;  Surgeon: Annice Needy, MD;  Location: [facility] ORS;  Service: Vascular;  Laterality: Left;    Social History   Socioeconomic History  . Marital status: Divorced    Spouse name: Not on file  . Number of children: 1  . Years of education: Not on file  . Highest education level: Associate degree: academic program  Occupational History  . Occupation: Lawyer    Comment: [employer]  Social Needs  . Financial resource strain: Very hard  . Food insecurity:    Worry: Never true    Inability: Never true  . Transportation needs:    Medical: No    Non-medical: No  Tobacco Use  . Smoking status: Former Smoker    Packs/day: 0.50    Years: 55.00    Pack years: 27.50    Types: Cigarettes    Start date: 07/01/1960    Last attempt to quit: 05/26/2018    Years since quitting: 0.2  . Smokeless tobacco: Never Used  . Tobacco comment: still using chantix  Substance and Sexual Activity  . Alcohol use: Yes    Alcohol/week: 0.0 standard drinks    Comment: 2 drinks per month  . Drug use: Not Currently    Types: Marijuana    Comment: used for pain  . Sexual activity: Not Currently  Lifestyle  . Physical activity:    Days per week: 0 days    Minutes per session: 0 min  . Stress: Only a little  Relationships  . Social connections:    Talks on phone: Patient refused    Gets together: Patient refused    Attends religious service: Patient refused    Active member of club or organization: Patient refused    Attends meetings of clubs or organizations: Patient refused    Relationship status: Divorced  . Intimate partner violence:    Fear of current or ex partner: No      Emotionally abused: No    Physically abused: No    Forced sexual activity: No  Other Topics Concern  . Not on file  Social History Narrative  . Not on file    Family History  Problem Relation Age of Onset  . Asthma Mother   . COPD Mother   .  Arthritis Father   . Depression Father   . Heart disease Father   . Hypertension Father   . Stroke Father   . Heart attack Father   . Arthritis Brother   . Depression Brother   . Diabetes Brother   . Heart disease Brother   . Hyperlipidemia Brother   . Hypertension Brother   . Stroke Brother   . Vision loss Brother   . Heart attack Brother   . Diabetes Maternal Grandmother   . Thyroid disease Daughter   . Colitis Daughter   . Breast cancer Neg Hx     Allergies  Allergen Reactions  . Meloxicam Other (See Comments)    GI bleeding  . Nsaids Other (See Comments)    Gi bleeding  . Sitagliptin Hives and Itching    Januvia      Review of Systems   Review of Systems: Negative Unless Checked Constitutional: Weight loss  Fever  Chills Cardiac: Chest pain    Atrial Fibrillation  Palpitations   Shortness of breath when laying flat   Shortness of breath with exertion. Shortness of breath at rest Vascular:  Pain in legs with walking   Pain in legs with standing Pain in legs when laying flat   Claudication    Pain in feet when laying flat    History of DVT   Phlebitis   Swelling in legs   Varicose veins   Non-healing ulcers Pulmonary:   Uses home oxygen   Productive cough   Hemoptysis   Wheeze  COPD   Asthma Neurologic:  Dizziness   Seizures  Blackouts History of stroke   History of TIA  Aphasia   Temporary Blindness   Weakness or numbness in arm   Weakness or numbness i time n leg Musculoskeletal:   Joint swelling   Joint pain   Low back pain   History of Knee Replacement Arthritis back Surgeries   Spinal Stenosis    Hematologic:  Easy bruising   Easy bleeding   Hypercoagulable state   Anemic Gastrointestinal:  Diarrhea   Vomiting  Gastroesophageal reflux/heartburn   Difficulty swallowing. Abdominal pain Genitourinary:  Chronic kidney disease   Difficult urination  Anuric   Blood in urine Frequent urination  Burning with urination   Hematuria Skin:  Rashes   Ulcers Wounds Psychological:  History of anxiety    History of major depression   Memory Difficulties      OBJECTIVE:   Physical Exam  BP (!) 206/68 (BP Location: Left Arm, Patient Position: Sitting, Cuff Size: Large)   Pulse 66   Resp 14   Ht  (1.549 m)   Wt 214 lb (97.1 kg)   BMI 40.43 kg/m   Gen: WD/WN, NAD Head: [place]/AT, No temporalis wasting.  Ear/Nose/Throat: Hearing grossly intact, nares w/o erythema or drainage Eyes: PER, EOMI, sclera nonicteric.  Neck: Supple, no masses.  No JVD.  Pulmonary:  Good air movement, no use of accessory muscles.  Cardiac: RRR Vascular:  Wound appears mostly well approximated, some tissue near the distal end of the wound.  Not draining, clean and dry Vessel Right Left  Radial Palpable Palpable   Gastrointestinal: soft, non-distended. No guarding/no peritoneal signs.  Musculoskeletal: Uses cane for ambulation. No deformity or atrophy.  Neurologic: Pain and light touch intact in extremities.  Symmetrical.  Speech is fluent. Motor exam as listed above. Psychiatric: Judgment intact, Mood & affect appropriate for pt's clinical situation. Dermatologic: No Venous rashes. No Ulcers Noted.  No changes consistent with cellulitis. Lymph : No Cervical lymphadenopathy, no lichenification or skin changes of chronic lymphedema.       ASSESSMENT AND PLAN:  1. Surgical site infection Advised the patient to place a small amount of silver Silvadene to wound daily when changing the dressing.  Patient should continue to keep the wound clean.  Stressed the patient that she can and should shower daily  and just pat the area dry.  She should ensure that the wound is Clean with the dressing.  Patient understood all instructions.  We will have patient return in 6 weeks to evaluate wound.  Advised patient that we will discuss possible angiogram on her right lower extremity at her next visit. - silver sulfADIAZINE (SILVADENE) 1 % cream; Apply 1 application topically daily.  Dispense: 50 g; Refill: 0  2. GERD without esophagitis Continue PPI as already ordered, this medication has been reviewed and there are no changes at this time.  Avoidence of caffeine and alcohol  Moderate elevation of the head of the bed   3. Mixed hyperlipidemia Continue statin as ordered and reviewed, no changes at this time    Current Outpatient Medications on File Prior to Visit  Medication Sig Dispense Refill  . acetaminophen (TYLENOL) 325 MG tablet Take 325 mg by mouth every 6 (six) hours as needed.     Marland Kitchen albuterol (PROVENTIL HFA;VENTOLIN HFA) 108 (90 Base) MCG/ACT inhaler Inhale 2 puffs into the lungs every 6 (six) hours as needed for wheezing or shortness of breath. 1 Inhaler 1  . atorvastatin (LIPITOR) 10 MG tablet Take 1 tablet (10 mg total) by mouth at bedtime. 90 tablet 1  . calcium carbonate (TUMS - DOSED IN MG ELEMENTAL CALCIUM) 500 MG chewable tablet Chew 3 tablets by mouth daily. Takes as she remembers    . clopidogrel (PLAVIX) 75 MG tablet Take 1 tablet (75 mg total) by mouth daily. 90 tablet 0  . co-enzyme Q-10 30 MG capsule Take 100 mg by mouth daily.     . famotidine (PEPCID) 20 MG tablet Take 1 tablet (20 mg total) by mouth 2 (two) times daily as needed for heartburn or indigestion. 60 tablet 2  . fluticasone (FLONASE) 50 MCG/ACT nasal spray Place 2 sprays into the nose daily as needed.    . gabapentin (NEURONTIN) 400 MG capsule Take 1 capsule (400 mg total) by mouth 2 (two) times daily. 180 capsule 1  . HYDROcodone-acetaminophen (NORCO) 5-325 MG tablet Take 0.5 tablets by mouth every 6 (six) hours  as needed for moderate pain. 30 tablet 0  . lisinopril (PRINIVIL,ZESTRIL) 10 MG tablet TAKE 1 TABLET BY MOUTH ONCE DAILY 90 tablet 1  . Melatonin 5 MG TABS Take 1 tablet by mouth at bedtime.     . Multiple Vitamins-Minerals (MULTIVITAMIN ADULTS 50+) TABS Take 1 tablet by mouth daily.     . sertraline (ZOLOFT) 100 MG tablet TAKE 1 AND 1/2 TABLETS BY MOUTH DAILY FOR A TOTAL OF 150 MG 135 tablet 1  . vitamin C (ASCORBIC ACID) 500 MG tablet Take 500 mg by mouth daily.      No current facility-administered medications on file prior to visit.     There are no Patient Instructions on file for this visit. No follow-ups on file.   Georgiana Spinner, NP  This note was completed with Office manager.  Any errors are purely unintentional.

## 2018-08-27 DIAGNOSIS — I119 Hypertensive heart disease without heart failure: Secondary | ICD-10-CM | POA: Diagnosis not present

## 2018-08-27 DIAGNOSIS — J449 Chronic obstructive pulmonary disease, unspecified: Secondary | ICD-10-CM | POA: Diagnosis not present

## 2018-08-27 DIAGNOSIS — I25118 Atherosclerotic heart disease of native coronary artery with other forms of angina pectoris: Secondary | ICD-10-CM | POA: Diagnosis not present

## 2018-08-27 DIAGNOSIS — M5416 Radiculopathy, lumbar region: Secondary | ICD-10-CM | POA: Diagnosis not present

## 2018-08-27 DIAGNOSIS — Z48812 Encounter for surgical aftercare following surgery on the circulatory system: Secondary | ICD-10-CM | POA: Diagnosis not present

## 2018-08-27 DIAGNOSIS — I70213 Atherosclerosis of native arteries of extremities with intermittent claudication, bilateral legs: Secondary | ICD-10-CM | POA: Diagnosis not present

## 2018-08-27 DIAGNOSIS — I350 Nonrheumatic aortic (valve) stenosis: Secondary | ICD-10-CM | POA: Diagnosis not present

## 2018-08-27 DIAGNOSIS — E1142 Type 2 diabetes mellitus with diabetic polyneuropathy: Secondary | ICD-10-CM | POA: Diagnosis not present

## 2018-08-27 DIAGNOSIS — G8929 Other chronic pain: Secondary | ICD-10-CM | POA: Diagnosis not present

## 2018-09-04 ENCOUNTER — Other Ambulatory Visit: Payer: Self-pay

## 2018-09-04 ENCOUNTER — Encounter (INDEPENDENT_AMBULATORY_CARE_PROVIDER_SITE_OTHER): Payer: Self-pay | Admitting: Nurse Practitioner

## 2018-09-04 ENCOUNTER — Ambulatory Visit (INDEPENDENT_AMBULATORY_CARE_PROVIDER_SITE_OTHER): Payer: Medicare HMO | Admitting: Nurse Practitioner

## 2018-09-04 VITALS — BP 182/65 | HR 73 | Resp 12 | Ht 61.0 in | Wt 210.0 lb

## 2018-09-04 DIAGNOSIS — Z79899 Other long term (current) drug therapy: Secondary | ICD-10-CM

## 2018-09-04 DIAGNOSIS — S81802D Unspecified open wound, left lower leg, subsequent encounter: Secondary | ICD-10-CM

## 2018-09-04 DIAGNOSIS — I70229 Atherosclerosis of native arteries of extremities with rest pain, unspecified extremity: Secondary | ICD-10-CM

## 2018-09-04 DIAGNOSIS — Z87891 Personal history of nicotine dependence: Secondary | ICD-10-CM

## 2018-09-04 DIAGNOSIS — E782 Mixed hyperlipidemia: Secondary | ICD-10-CM

## 2018-09-04 NOTE — Progress Notes (Signed)
SUBJECTIVE:  Patient ID: Andrea Holden, female    DOB: 06-17-1946, 73 y.o.   MRN: [id number] Chief Complaint  Patient presents with  . Follow-up    HPI  Andrea Holden is a 73 y.o. female presents today for wound assessment following left femoral endarterectomy.  Upon the initial evaluation of the wound it had started dehiscing showing signs of wound infection.  At her second evaluation the wound had completely dehisced with copious foul-smelling drainage.  She was subsequently taken to the OR to have the wound debrided with a wound VAC placed.  Several weeks after wound VAC placement the wound is healed nicely, but still had a very small amount that was not fully healed.  Today, the wound is completely healed with no open areas at all.  The patient states that she has not had any drainage at all.  Patient denies any fever, chills, nausea, vomiting or diarrhea.  She denies any chest pain or shortness of breath.  She is still experiencing claudication or rest pain like symptoms of her right lower extremity.  She is eager to schedule intervention for this lower extremity.  Past Medical History:  Diagnosis Date  . Allergy    seasonal allergies  . Anemia 2014   needed 5 units of blood d/t passing out, weak  . Anxiety   . Aortic stenosis 12/24/2017   Echo Aug 2018  . Arthritis   . Asthma    allergy induced asthma  . Cardiac murmur 12/12/2017  . Cataract   . Cataract    left  . Complication of anesthesia    arrhythmia following colonoscopy  . Degenerative disc disease, lumbar   . Degenerative disc disease, lumbar   . Depression   . Diabetes mellitus   . Diabetic neuropathy (HCC) 11/16/2015  . Dysrhythmia    stenosis of left ventricle  . GERD (gastroesophageal reflux disease)   . H/O transfusion    patient was given 5 units of blood while at [facility], blood type O+  . History of chicken pox   . History of hiatal hernia   . History of measles, mumps, or rubella   . HOH  (hard of hearing)    does not use hearing aides yet  . Hyperlipidemia   . Hypertension   . Irregular heartbeat   . LVH (left ventricular hypertrophy) 12/24/2017   Echo Aug 2018  . Neuropathy   . Opiate use 11/16/2015  . Peripheral arterial disease (HCC) 05/06/2018   At rest, left > right; refer to vasc  . Pes planus of both feet 11/16/2015  . Plantar fasciitis of right foot 11/16/2015  . Wheezing     Past Surgical History:  Procedure Laterality Date  . ABDOMINAL HYSTERECTOMY    . APPLICATION OF WOUND VAC Left 07/23/2018   Procedure: APPLICATION OF WOUND VAC;  Surgeon: Annice Needy, MD;  Location: [facility] ORS;  Service: Vascular;  Laterality: Left;  . banck injections    . CATARACT EXTRACTION W/PHACO Right 05/30/2015   Procedure: CATARACT EXTRACTION PHACO AND INTRAOCULAR LENS PLACEMENT (IOC);  Surgeon: Galen Manila, MD;  Location: [facility] ORS;  Service: Ophthalmology;  Laterality: Right;  [place] 00:57 AP% 20.9 CDE 11.99 fluid pack lot #[id number]  . CHOLECYSTECTOMY  1970  . COLON SURGERY  2013   blocked colon  . COLONOSCOPY WITH PROPOFOL N/A 01/20/2018   Procedure: COLONOSCOPY WITH PROPOFOL;  Surgeon: Midge Minium, MD;  Location[facility];  Service: Endoscopy;  Laterality: N/A;  .  ENDARTERECTOMY FEMORAL Left 07/08/2018   Procedure: ENDARTERECTOMY FEMORAL;  Surgeon: Annice Needy, MD;  Location: [facility] ORS;  Service: Vascular;  Laterality: Left;  . EYE SURGERY Right    cataract surgery  . FOOT FUSION Right 2018   metal in foot  . GALLBLADDER SURGERY  1970  . GASTRIC BYPASS  2010   lost 178 lbs and regained 40 lbs last few years  . HUMERUS FRACTURE SURGERY Right    metal plate with screws  . internal bleeding  2016   ulcer in past  . LOWER EXTREMITY ANGIOGRAM Left 07/08/2018   Procedure: LOWER EXTREMITY ANGIOGRAM;  Surgeon: Annice Needy, MD;  Location: [facility] ORS;  Service: Vascular;  Laterality: Left;  . LOWER EXTREMITY ANGIOGRAPHY Left 05/27/2018   Procedure: LOWER EXTREMITY ANGIOGRAPHY;   Surgeon: Annice Needy, MD;  Location: [facility] INVASIVE CV LAB;  Service: Cardiovascular;  Laterality: Left;  . MOUTH SURGERY     root canals and crowns and extractions  . OVARY SURGERY    . SKIN GRAFT Right 2018   RT foot. foot has been rebuilt.  it is full of metal  . TENOTOMY ACHILLES TENDON Right    Percuntaneous. metal in foot  . TONSILLECTOMY    . WOUND DEBRIDEMENT Left 07/23/2018   Procedure: DEBRIDEMENT WOUND;  Surgeon: Annice Needy, MD;  Location: [facility] ORS;  Service: Vascular;  Laterality: Left;    Social History   Socioeconomic History  . Marital status: Divorced    Spouse name: Not on file  . Number of children: 1  . Years of education: Not on file  . Highest education level: Associate degree: academic program  Occupational History  . Occupation: Lawyer    Comment: [employer]  Social Needs  . Financial resource strain: Very hard  . Food insecurity:    Worry: Never true    Inability: Never true  . Transportation needs:    Medical: No    Non-medical: No  Tobacco Use  . Smoking status: Former Smoker    Packs/day: 0.50    Years: 55.00    Pack years: 27.50    Types: Cigarettes    Start date: 07/01/1960    Last attempt to quit: 05/26/2018    Years since quitting: 0.2  . Smokeless tobacco: Never Used  . Tobacco comment: still using chantix  Substance and Sexual Activity  . Alcohol use: Yes    Alcohol/week: 0.0 standard drinks    Comment: 2 drinks per month  . Drug use: Not Currently    Types: Marijuana    Comment: used for pain  . Sexual activity: Not Currently  Lifestyle  . Physical activity:    Days per week: 0 days    Minutes per session: 0 min  . Stress: Only a little  Relationships  . Social connections:    Talks on phone: Patient refused    Gets together: Patient refused    Attends religious service: Patient refused    Active member of club or organization: Patient refused    Attends meetings of clubs or  organizations: Patient refused    Relationship status: Divorced  . Intimate partner violence:    Fear of current or ex partner: No    Emotionally abused: No    Physically abused: No    Forced sexual activity: No  Other Topics Concern  . Not on file  Social History Narrative  . Not on file    Family History  Problem Relation Age of Onset  . Asthma Mother   . COPD Mother   . Arthritis Father   . Depression Father   . Heart disease Father   . Hypertension Father   . Stroke Father   . Heart attack Father   . Arthritis Brother   . Depression Brother   . Diabetes Brother   . Heart disease Brother   . Hyperlipidemia Brother   . Hypertension Brother   . Stroke Brother   . Vision loss Brother   . Heart attack Brother   . Diabetes Maternal Grandmother   . Thyroid disease Daughter   . Colitis Daughter   . Breast cancer Neg Hx     Allergies  Allergen Reactions  . Meloxicam Other (See Comments)    GI bleeding  . Nsaids Other (See Comments)    Gi bleeding  . Sitagliptin Hives and Itching    Januvia      Review of Systems   Review of Systems: Negative Unless Checked Constitutional: Weight loss  Fever  Chills Cardiac: Chest pain    Atrial Fibrillation  Palpitations   Shortness of breath when laying flat   Shortness of breath with exertion. Shortness of breath at rest Vascular:  Pain in legs with walking   Pain in legs with standing Pain in legs when laying flat   Claudication    Pain in feet when laying flat    History of DVT   Phlebitis   Swelling in legs   Varicose veins   Non-healing ulcers Pulmonary:   Uses home oxygen   Productive cough   Hemoptysis   Wheeze  COPD   Asthma Neurologic:  Dizziness   Seizures  Blackouts History of stroke   History of TIA  Aphasia   Temporary Blindness   Weakness or numbness in arm   Weakness or numbness in leg Musculoskeletal:   Joint swelling   Joint pain   Low back  pain   History of Knee Replacement Arthritis back Surgeries   Spinal Stenosis    Hematologic:  Easy bruising  Easy bleeding   Hypercoagulable state   Anemic Gastrointestinal:  Diarrhea   Vomiting  Gastroesophageal reflux/heartburn   Difficulty swallowing. Abdominal pain Genitourinary:  Chronic kidney disease   Difficult urination  Anuric   Blood in urine Frequent urination  Burning with urination   Hematuria Skin:  Rashes   Ulcers Wounds Psychological:  History of anxiety    History of major depression   Memory Difficulties      OBJECTIVE:   Physical Exam  BP (!) 182/65 (BP Location: Left Wrist, Patient Position: Sitting, Cuff Size: Small)   Pulse 73   Resp 12   Ht  (1.549 m)   Wt 210 lb (95.3 kg)   BMI 39.68 kg/m   Gen: WD/WN, NAD Head: [place]/AT, No temporalis wasting.  Ear/Nose/Throat: Hearing grossly intact, nares w/o erythema or drainage Eyes: PER, EOMI, sclera nonicteric.  Neck: Supple, no masses.  No JVD.  Pulmonary:  Good air movement, no use of accessory muscles.  Cardiac: RRR Vascular:  Well-healed incision at left groin Vessel Right Left  Radial Palpable Palpable   Gastrointestinal: soft, non-distended. No guarding/no peritoneal signs.  Musculoskeletal: Uses cane for ambulation..  No deformity or atrophy.  Neurologic: Pain and light touch intact in extremities.  Symmetrical.  Speech is fluent. Motor exam as listed above. Psychiatric: Judgment intact, Mood & affect appropriate for pt's clinical situation. Dermatologic: No Venous rashes. No Ulcers Noted.  No changes consistent with cellulitis. Lymph : No Cervical lymphadenopathy, no lichenification or skin changes of chronic lymphedema.       ASSESSMENT AND PLAN:  1. Atherosclerosis of artery of extremity with rest pain (HCC) Recommend:  The patient has experienced increased symptoms and is now describing lifestyle limiting claudication and mild rest  pain.   Given the severity of the patient's right lower extremity symptoms the patient should undergo angiography and intervention.  Risk and benefits were reviewed the patient.  Indications for the procedure were reviewed.  All questions were answered, the patient agrees to proceed.   The patient should continue walking and begin a more formal exercise program.  The patient should continue antiplatelet therapy and aggressive treatment of the lipid abnormalities  The patient will follow up with me after the angiogram.   2. Mixed hyperlipidemia Continue statin as ordered and reviewed, no changes at this time   3. Non-healing wound of lower extremity, left, subsequent encounter Wound is well-healed.  No further dressings or intervention needed.  Patient advised to continue to keep the area clean and dry.  She should notify us should it begin to drain or opening it.   Current Outpatient Medications on File Prior to Visit  Medication Sig Dispense Refill  . acetaminophen (TYLENOL) 325 MG tablet Take 325 mg by mouth every 6 (six) hours as needed.     Marland Kitchen albuterol (PROVENTIL HFA;VENTOLIN HFA) 108 (90 Base) MCG/ACT inhaler Inhale 2 puffs into the lungs every 6 (six) hours as needed for wheezing or shortness of breath. 1 Inhaler 1  . atorvastatin (LIPITOR) 10 MG tablet Take 1 tablet (10 mg total) by mouth at bedtime. 90 tablet 1  . calcium carbonate (TUMS - DOSED IN MG ELEMENTAL CALCIUM) 500 MG chewable tablet Chew 3 tablets by mouth daily. Takes as she remembers    . clopidogrel (PLAVIX) 75 MG tablet Take 1 tablet (75 mg total) by mouth daily. 90 tablet 0  . co-enzyme Q-10 30 MG capsule Take 100 mg by mouth daily.     . famotidine (PEPCID) 20 MG tablet Take 1 tablet (20 mg total) by mouth 2 (two) times daily as needed for heartburn or indigestion. 60 tablet 2  . fluticasone (FLONASE) 50 MCG/ACT nasal spray Place 2 sprays into the nose daily as needed.    . gabapentin (NEURONTIN) 400 MG capsule  Take 1 capsule (400 mg total) by mouth 2 (two) times daily. 180 capsule 1  . HYDROcodone-acetaminophen (NORCO) 5-325 MG tablet Take 0.5 tablets by mouth every 6 (six) hours as needed for moderate pain. 30 tablet 0  . lisinopril (PRINIVIL,ZESTRIL) 10 MG tablet TAKE 1 TABLET BY MOUTH ONCE DAILY 90 tablet 1  . Melatonin 5 MG TABS Take 1 tablet by mouth at bedtime.     . Multiple Vitamins-Minerals (MULTIVITAMIN ADULTS 50+) TABS Take 1 tablet by mouth daily.     . sertraline (ZOLOFT) 100 MG tablet TAKE 1 AND 1/2 TABLETS BY MOUTH DAILY FOR A TOTAL OF 150 MG 135 tablet 1  . silver sulfADIAZINE (SILVADENE) 1 % cream Apply 1 application topically daily. 50 g 0  . vitamin C (ASCORBIC ACID) 500 MG tablet Take 500 mg by mouth daily.      No current facility-administered medications on file prior to visit.     There are no Patient Instructions on file for this visit. No follow-ups on file.   Georgiana Spinner, NP  This note was completed with Office manager.  Any  errors are purely unintentional.

## 2018-09-07 ENCOUNTER — Encounter (INDEPENDENT_AMBULATORY_CARE_PROVIDER_SITE_OTHER): Payer: Self-pay

## 2018-09-07 ENCOUNTER — Telehealth (INDEPENDENT_AMBULATORY_CARE_PROVIDER_SITE_OTHER): Payer: Self-pay

## 2018-09-07 NOTE — Telephone Encounter (Signed)
I spoke with the patient on Friday and scheduled her angio procedure. Patient is aware of the day and time of the procedure, 09/14/2018 arrival time of 6:45 am. I let her know that I would get her pre-op scheduled and put the information in the mail to her.

## 2018-09-08 ENCOUNTER — Ambulatory Visit (INDEPENDENT_AMBULATORY_CARE_PROVIDER_SITE_OTHER): Payer: Medicare Other | Admitting: Pharmacist

## 2018-09-08 DIAGNOSIS — I119 Hypertensive heart disease without heart failure: Secondary | ICD-10-CM

## 2018-09-08 DIAGNOSIS — Z598 Other problems related to housing and economic circumstances: Secondary | ICD-10-CM

## 2018-09-08 DIAGNOSIS — Z599 Problem related to housing and economic circumstances, unspecified: Secondary | ICD-10-CM

## 2018-09-11 ENCOUNTER — Inpatient Hospital Stay: Admission: RE | Admit: 2018-09-11 | Payer: Medicare HMO | Source: Ambulatory Visit

## 2018-09-14 ENCOUNTER — Ambulatory Visit: Admit: 2018-09-14 | Payer: Medicare HMO | Admitting: Vascular Surgery

## 2018-09-14 SURGERY — LOWER EXTREMITY ANGIOGRAPHY
Anesthesia: Moderate Sedation | Site: Leg Lower | Laterality: Right

## 2018-09-15 ENCOUNTER — Ambulatory Visit: Payer: Self-pay

## 2018-09-15 ENCOUNTER — Telehealth: Payer: Self-pay

## 2018-09-16 ENCOUNTER — Telehealth: Payer: Medicare Other

## 2018-09-16 ENCOUNTER — Ambulatory Visit: Payer: Self-pay | Admitting: *Deleted

## 2018-09-16 ENCOUNTER — Other Ambulatory Visit (INDEPENDENT_AMBULATORY_CARE_PROVIDER_SITE_OTHER): Payer: Self-pay | Admitting: Vascular Surgery

## 2018-09-16 ENCOUNTER — Other Ambulatory Visit: Payer: Self-pay | Admitting: Family Medicine

## 2018-09-16 ENCOUNTER — Telehealth (INDEPENDENT_AMBULATORY_CARE_PROVIDER_SITE_OTHER): Payer: Self-pay | Admitting: Nurse Practitioner

## 2018-09-16 DIAGNOSIS — Z599 Problem related to housing and economic circumstances, unspecified: Secondary | ICD-10-CM

## 2018-09-16 DIAGNOSIS — Z598 Other problems related to housing and economic circumstances: Secondary | ICD-10-CM

## 2018-09-16 DIAGNOSIS — Z789 Other specified health status: Secondary | ICD-10-CM

## 2018-09-16 NOTE — Patient Instructions (Signed)
Thank you allowing the Chronic Care Management Team to be a part of your care! It was a pleasure speaking with you today!  1. Please sign completed Medicaid application and use enclosed envelope to mail application to [facility] [address]. [place], [place] [address]  CCM (Chronic Care Management) Team   Yvone Neu RN, BSN Nurse Care Coordinator  [phone]   Karalee Height PharmD  Clinical Pharmacist  [phone]   Verna Czech, LCSW Clinical Social Worker [phone]  Goals Addressed            This Visit's Progress   . I need help applying for Medicaid  (pt-stated)       Current Barriers:  . Financial constraints  Clinical Social Work Clinical Goal(s):  Marland Kitchen Over the next 30 days, client will work with SW to address concerns related to completion of community Medicaid application  Interventions: . Patient interviewed and appropriate assessments performed . Provided patient with information about applying for community medicaid . Discussed plans with patient for ongoing care management follow up and provided patient with direct contact information for care management team . Advised patient to sign completed medicaid application and mail to the [facility] Department of Social Services  Patient Self Care Activities:  . Attends all scheduled provider appointments  Initial goal documentation        The patient verbalized understanding of instructions provided today and declined a print copy of patient instruction materials.   Telephone follow up appointment with CCM team member scheduled for: 09/29/18

## 2018-09-16 NOTE — Telephone Encounter (Signed)
[place] from [facility] calling requesting verbal order for patient wound care to continue. Requesting wound care for 2 times a week for 2 weeks then 1 time a week for 1 week. Please advise. AS, CMA

## 2018-09-16 NOTE — Telephone Encounter (Signed)
That's fine. Give them a verbal order from me per due

## 2018-09-16 NOTE — Chronic Care Management (AMB) (Signed)
  Chronic Care Management    Clinical Social Work Follow Up Note  09/16/2018 Name: Andrea Holden MRN: [id number] DOB: [birth year]  Andrea Holden is a 73 y.o. year old female who is a primary care patient of Lada, Janit Bern, MD. This social worker was consulted to assist patient with completing a Medicaid application. Per patient, she is having difficulty affording her prescriptions and is hopeful that with Medicaid approval she could receive assistance with medications and past/future medical bills.  Review of patient status, including review of consultants reports, other relevant assessments, and collaboration with appropriate care team members and the patient's provider was performed as part of comprehensive patient evaluation and provision of chronic care management services.     Goals Addressed            This Visit's Progress   . I need help applying for Medicaid  (pt-stated)       Patient discussed having financial issues related to past/future medical bills and affording her medication. She is requesting assistance with applying for Medicaid for possible assistance. Patient briefly discussed history of relationship conflicts with daughter, however was able to verbalize positive coping related to the conflict. Current Barriers:  . Financial constraints  Clinical Social Work Clinical Goal(s):  Marland Kitchen Over the next 30 days, client will work with SW to address concerns related to completion of community Medicaid application  Interventions: . Patient interviewed and appropriate assessments performed . Provided patient with information regarding application process for community medicaid . Discussed plans with patient for ongoing care management follow up and provided patient with direct contact information for care management team . Completed Medicaid application by phone and advised patient to sign completed medicaid application and mail to the [facility] of Social  Services for review.  Patient Self Care Activities:  . Attends all scheduled provider appointments  Initial goal documentation        Follow Up Plan: SW will follow up with patient by phone over the next 2 weeks to ensure completed application was received, signed and returned to the [facility].   Verna Czech, LCSW Clinical Social [facility] Care Management [phone]

## 2018-09-16 NOTE — Telephone Encounter (Signed)
Home health nurse is aware of the below. AS, CMA

## 2018-09-17 ENCOUNTER — Encounter: Payer: Self-pay | Admitting: *Deleted

## 2018-09-25 ENCOUNTER — Telehealth: Payer: Self-pay | Admitting: Family Medicine

## 2018-09-25 NOTE — Telephone Encounter (Addendum)
Patient wants a refill on Chantix not currently on her med list. She has had it in the past

## 2018-09-28 MED ORDER — VARENICLINE TARTRATE 0.5 MG X 11 & 1 MG X 42 PO MISC: ORAL | 0 refills | Status: DC

## 2018-09-28 NOTE — Telephone Encounter (Signed)
Patient requests trial of Chantix. Approved with info below  Andrea Holden, Please caution her about common side effects including but not limited to nausea/vomiting, vivid dreams, behaviour changes, depression, suicidal ideation Also, please explaine possible increased cardiovascular risk Please give her the 1-800-QUIT-NOW number Choose a quit date about 1 week after starting medicine  Thank you

## 2018-09-28 NOTE — Addendum Note (Signed)
Addended by: Gurkaran Rahm, Janit Bern on: 09/28/2018 10:13 AM   Modules accepted: Orders

## 2018-09-28 NOTE — Telephone Encounter (Signed)
Pt.notified

## 2018-09-29 ENCOUNTER — Other Ambulatory Visit: Payer: Self-pay

## 2018-09-29 ENCOUNTER — Ambulatory Visit: Payer: Medicare Other | Admitting: *Deleted

## 2018-09-29 DIAGNOSIS — Z598 Other problems related to housing and economic circumstances: Secondary | ICD-10-CM

## 2018-09-29 DIAGNOSIS — Z599 Problem related to housing and economic circumstances, unspecified: Secondary | ICD-10-CM

## 2018-09-29 DIAGNOSIS — Z748 Other problems related to care provider dependency: Secondary | ICD-10-CM
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
## 2018-09-29 NOTE — Patient Instructions (Signed)
Thank you allowing the Chronic Care Management Team to be a part of your care! It was a pleasure speaking with you today!  1. Please sign and return the Medicaid application to the Department of Social Services using the enclosed envelope. 2.Please contact this Child psychotherapist when you have received the letter from the Department of Social Services requesting additional information.   CCM (Chronic Care Management) Team   Yvone Neu RN, BSN Nurse Care Coordinator  [phone]  Karalee Height PharmD  Clinical Pharmacist  [phone]   Verna Czech, LCSW Clinical Social Worker [phone]  Goals Addressed            This Visit's Progress   . I need help applying for Medicaid  (pt-stated)       Current Barriers:  . Financial constraints  Clinical Social Work Clinical Goal(s):  Marland Kitchen Over the next 30 days, client will work with SW to address concerns related to completion of community Medicaid application  Interventions: . Patient interviewed and appropriate assessments performed . Provided patient with additional information about applying for community medicaid . Discussed plans with patient for ongoing care management follow up and provided patient with direct contact information for care management team . Confirmed that patient has received the Medicaid application that was completed by phone and encouraged her to sign it and mail it in the enclosed enveleope provided to the [facility] . Provided emotional support and assessed for injury due to a recent fall.  Patient Self Care Activities:  . Attends all scheduled provider appointments  Please see past updates related to this goal by clicking on the "Past Updates" button in the selected goal         The patient verbalized understanding of instructions provided today and declined a print copy of patient instruction materials.   The CM team will reach out to the patient again  over the next 30 days.

## 2018-09-30 NOTE — Chronic Care Management (AMB) (Signed)
  Chronic Care Management   Note  09/30/2018 Name: Andrea Holden MRN: [id number] DOB: April 26, 1946  Successful outreach to [employer] following questions patients had regarding medications during conversation with Yvone Neu, RNCM. HIPAA identifiers verified.   Assessment:  #Statin medications and grapefruit: Provided counseling to patient about clinical risks and interactions of grapefruit and statin medications (CYP3A4 inhibition). She should not experience any significant clinical risk taking rosuvastatin and an occasional grapefruit serving or grapefruit juice. Possible adverse reactions include increased serum concentrations of rosuvastatin, manifesting clinically as increased myalgia.  #"Blood Flow" supplement (nitric oxide): Patient had questions regarding an advertisement she saw for a "blood flow" supplement. I did NOT recommend this supplement and educated patient on the possible risks of nitric oxide and the very little clinical utility it would have. Further provided counseling on advertising and dietary supplements vs. FDA approved medications.   Follow up plan: The patient has been provided with contact information for the chronic care management team and has been advised to call with any health related questions or concerns.   Karalee Height, PharmD Clinical Pharmacist [facility] [phone]

## 2018-09-30 NOTE — Patient Instructions (Signed)
Goals    . "i want to get back to running in the dog ring" (pt-stated)     Current Barriers:  . Current health status . Lack of stamina and strength   Nurse Case Manager Clinical Goal(s): Over the next 3 months, patient will verbalize engaging in silver sneakers to improve stamina and strength  Interventions:  . Assisted patient with Fitbit activity tracker set up on mobile device and assisted with activity goal setting   Patient Self Care Activities:  . Patient will explore options for exercise once wound removed . Patient will engage in exercise to improve stamina and strength . Patient will continue to work with PT . Patient will discuss activity goals with PT during today's session.   Please see past updates in goals section for documentation of goal progression      . DIET - INCREASE WATER INTAKE     Recommend to drink at least 6-8 8oz glasses of water per day.    . I need help applying for Medicaid  (pt-stated)     Current Barriers:  . Financial constraints  Clinical Social Work Clinical Goal(s):  Marland Kitchen Over the next 30 days, client will work with SW to address concerns related to completion of community Medicaid application  Interventions: . Patient interviewed and appropriate assessments performed . Provided patient with additional information about applying for community medicaid . Discussed plans with patient for ongoing care management follow up and provided patient with direct contact information for care management team . Confirmed that patient has received the Medicaid application that was completed by phone and encouraged her to sign it and mail it in the enclosed enveleope provided to the [facility] . Provided emotional support and assessed for injury due to a recent fall.  Patient Self Care Activities:  . Attends all scheduled provider appointments  Please see past updates related to this goal by clicking on the "Past Updates"  button in the selected goal     . i need help setting up my new Fitbit and my new BP monitor (pt-stated)     Current Barriers:  . Lacks caregiver support.  . Knowledge Deficits related to understanding technology of Fitbit and how to set up  Nurse Case Manager Clinical Goal(s):  Marland Kitchen Over the next 14 days, patient will demonstrate improved health management independence as evidenced byutilizing her new BP cuff and Fitbit for monitoring activity, HR, and BP  Interventions:  . Provided education to patient re: utilization of Fitbit and importance of setting activity goals to decrease sedentary lifestyle . Advised patient, providing education and rationale, to monitor blood pressure daily and record, calling Dr. Sherie Don for findings outside established parameters.   Patient Self Care Activities:  . Increase daily activity by monitoring "steps" . Check and record BP daily  Plan:  . RNCM will provide technology support as needed . Patient will contact the CCM Team with any questions regarding technology reviewed today  *initial goal documentation        The patient verbalized understanding of instructions provided today and declined a print copy of patient instruction materials.

## 2018-10-01 ENCOUNTER — Ambulatory Visit: Payer: Self-pay

## 2018-10-08 ENCOUNTER — Other Ambulatory Visit: Payer: Self-pay | Admitting: Family Medicine

## 2018-10-08 DIAGNOSIS — R2689 Other abnormalities of gait and mobility: Secondary | ICD-10-CM | POA: Diagnosis not present

## 2018-10-08 DIAGNOSIS — I350 Nonrheumatic aortic (valve) stenosis: Secondary | ICD-10-CM | POA: Diagnosis not present

## 2018-10-08 DIAGNOSIS — I10 Essential (primary) hypertension: Secondary | ICD-10-CM | POA: Diagnosis not present

## 2018-10-08 DIAGNOSIS — M5136 Other intervertebral disc degeneration, lumbar region: Secondary | ICD-10-CM | POA: Diagnosis not present

## 2018-10-08 DIAGNOSIS — E1151 Type 2 diabetes mellitus with diabetic peripheral angiopathy without gangrene: Secondary | ICD-10-CM | POA: Diagnosis not present

## 2018-10-08 DIAGNOSIS — D649 Anemia, unspecified: Secondary | ICD-10-CM | POA: Diagnosis not present

## 2018-10-08 DIAGNOSIS — E875 Hyperkalemia: Secondary | ICD-10-CM

## 2018-10-08 DIAGNOSIS — E1140 Type 2 diabetes mellitus with diabetic neuropathy, unspecified: Secondary | ICD-10-CM | POA: Diagnosis not present

## 2018-10-08 DIAGNOSIS — R296 Repeated falls: Secondary | ICD-10-CM | POA: Diagnosis not present

## 2018-10-08 NOTE — Telephone Encounter (Signed)
Lab Results  Component Value Date   CREATININE 0.91 07/24/2018   Lab Results  Component Value Date   K 5.6 (H) 07/24/2018   Patient's last potassium was elevated This was when she was in the hospital Please ask her to have a STAT potassium drawn Friday (I am seeing this request for her ACE-I after 7 pm on Thursday evening) We need to make sure her potassium is normal before refilling the lisinopril Make sure she is not using any salt substitutes or taking any potassium supplements Please ORDER a BMP for wherever she can go (here or [facility] or hospital) and make sure it is STAT

## 2018-10-09 ENCOUNTER — Other Ambulatory Visit
Admission: RE | Admit: 2018-10-09 | Discharge: 2018-10-09 | Disposition: A | Discharge status: Home / Self Care | Payer: Medicare HMO | Source: Ambulatory Visit | Attending: Family Medicine | Admitting: Family Medicine

## 2018-10-09 DIAGNOSIS — E875 Hyperkalemia: Secondary | ICD-10-CM | POA: Insufficient documentation

## 2018-10-09 LAB — BASIC METABOLIC PANEL
Anion gap: 8 (ref 5–15)
BUN: 19 mg/dL (ref 8–23)
CO2: 25 mmol/L (ref 22–32)
Calcium: 8.9 mg/dL (ref 8.9–10.3)
Chloride: 102 mmol/L (ref 98–111)
Creatinine, Ser: 0.81 mg/dL (ref 0.44–1.00)
GFR calc Af Amer: >60 mL/min (ref >60)
GFR calc non Af Amer: >60 mL/min (ref >60)
Glucose, Bld: 107 mg/dL — ABNORMAL HIGH (ref 70–99)
Potassium: 5 mmol/L (ref 3.5–5.1)
Sodium: 135 mmol/L (ref 135–145)

## 2018-10-09 NOTE — Telephone Encounter (Signed)
Pt notified, will go to hospital, lab enetered

## 2018-10-13 ENCOUNTER — Telehealth: Payer: Self-pay | Admitting: Family Medicine

## 2018-10-13 ENCOUNTER — Telehealth: Payer: Self-pay

## 2018-10-13 ENCOUNTER — Encounter: Payer: Self-pay | Admitting: Family Medicine

## 2018-10-13 ENCOUNTER — Ambulatory Visit: Admission: RE | Admit: 2018-10-13 | Payer: Medicare Other | Source: Home / Self Care | Admitting: Ophthalmology

## 2018-10-13 ENCOUNTER — Encounter: Admission: RE | Payer: Self-pay | Source: Home / Self Care

## 2018-10-13 DIAGNOSIS — R2689 Other abnormalities of gait and mobility: Secondary | ICD-10-CM | POA: Diagnosis not present

## 2018-10-13 DIAGNOSIS — I10 Essential (primary) hypertension: Secondary | ICD-10-CM | POA: Diagnosis not present

## 2018-10-13 DIAGNOSIS — R296 Repeated falls: Secondary | ICD-10-CM | POA: Diagnosis not present

## 2018-10-13 DIAGNOSIS — M5136 Other intervertebral disc degeneration, lumbar region: Secondary | ICD-10-CM | POA: Diagnosis not present

## 2018-10-13 DIAGNOSIS — E1151 Type 2 diabetes mellitus with diabetic peripheral angiopathy without gangrene: Secondary | ICD-10-CM | POA: Diagnosis not present

## 2018-10-13 DIAGNOSIS — E1140 Type 2 diabetes mellitus with diabetic neuropathy, unspecified: Secondary | ICD-10-CM | POA: Diagnosis not present

## 2018-10-13 DIAGNOSIS — I350 Nonrheumatic aortic (valve) stenosis: Secondary | ICD-10-CM | POA: Diagnosis not present

## 2018-10-13 DIAGNOSIS — D649 Anemia, unspecified: Secondary | ICD-10-CM | POA: Diagnosis not present

## 2018-10-13 SURGERY — PHACOEMULSIFICATION, CATARACT, WITH IOL INSERTION
Anesthesia: Choice | Laterality: Left

## 2018-10-13 NOTE — Telephone Encounter (Signed)
No but she states she is high risk?

## 2018-10-13 NOTE — Telephone Encounter (Signed)
[place] pharmacy called and stated that the patients famotidine is on back order, and would like an alternative sent in. Please advise pharmacy

## 2018-10-13 NOTE — Telephone Encounter (Signed)
I called patient but reached voicemail  Please call her on Wednesday and let her know that we are not writing people out of work at this time per guidelines just for prevention, but I can certainly write a letter stating she is at high risk for complications should she contract COVID-19  If she has questions and needs time on the phone, please set her up for a telehealth visit with me on Wednesday or Thursday; thank you

## 2018-10-13 NOTE — Telephone Encounter (Signed)
Does she have COVID-19?

## 2018-10-13 NOTE — Telephone Encounter (Signed)
Copied from CRM [id number]. Topic: General - Inquiry >> Oct 13, 2018 12:59 PM Reggie Pile, NT wrote: Reason for CRM: Patient called in and is requesting a letter to her job in regards to COVID so she will be out of work. Patient stated th eletter must say she is under her doctor's care and cannot return to work due to [employer]. Requesting note be faxed over to [phone]. Patient also states she has a few questions for Dr.Lada and would like a call back at [phone]

## 2018-10-13 NOTE — Telephone Encounter (Signed)
Patient is a Investment banker, corporate, and needs money assistance

## 2018-10-14 ENCOUNTER — Telehealth: Payer: Self-pay | Admitting: Family Medicine

## 2018-10-14 MED ORDER — NIZATIDINE 150 MG PO CAPS: 150.0000 mg | ORAL_CAPSULE | Freq: Two times a day (BID) | ORAL | 1 refills | Status: DC

## 2018-10-14 MED ORDER — PANTOPRAZOLE SODIUM 20 MG PO TBEC: 20.0000 mg | DELAYED_RELEASE_TABLET | Freq: Every day | ORAL | 0 refills | Status: DC

## 2018-10-14 NOTE — Telephone Encounter (Signed)
Short term Rx for pantoprazole since she is on plavix Note sent to pharmacy this is short term until they can get famotidine again

## 2018-10-14 NOTE — Telephone Encounter (Signed)
Left detailed voicemail

## 2018-10-14 NOTE — Telephone Encounter (Signed)
[place] pharmacy called and stated that they received the medication change for the Axid but they do not carry that medication and would like the patient to be switched to Prilosec or Protonix. Please advise [phone]

## 2018-10-14 NOTE — Addendum Note (Signed)
Addended by: LADA, Janit Bern on: 10/14/2018 10:12 AM   Modules accepted: Orders

## 2018-10-14 NOTE — Addendum Note (Signed)
Addended by: LADA, Janit Bern on: 10/14/2018 08:04 AM   Modules accepted: Orders

## 2018-10-15 DIAGNOSIS — I350 Nonrheumatic aortic (valve) stenosis: Secondary | ICD-10-CM | POA: Diagnosis not present

## 2018-10-15 DIAGNOSIS — M5136 Other intervertebral disc degeneration, lumbar region: Secondary | ICD-10-CM | POA: Diagnosis not present

## 2018-10-15 DIAGNOSIS — E1140 Type 2 diabetes mellitus with diabetic neuropathy, unspecified: Secondary | ICD-10-CM | POA: Diagnosis not present

## 2018-10-15 DIAGNOSIS — E1151 Type 2 diabetes mellitus with diabetic peripheral angiopathy without gangrene: Secondary | ICD-10-CM | POA: Diagnosis not present

## 2018-10-15 DIAGNOSIS — I10 Essential (primary) hypertension: Secondary | ICD-10-CM | POA: Diagnosis not present

## 2018-10-15 DIAGNOSIS — R2689 Other abnormalities of gait and mobility: Secondary | ICD-10-CM | POA: Diagnosis not present

## 2018-10-15 DIAGNOSIS — D649 Anemia, unspecified: Secondary | ICD-10-CM | POA: Diagnosis not present

## 2018-10-15 DIAGNOSIS — R296 Repeated falls: Secondary | ICD-10-CM | POA: Diagnosis not present

## 2018-10-20 DIAGNOSIS — M5136 Other intervertebral disc degeneration, lumbar region: Secondary | ICD-10-CM | POA: Diagnosis not present

## 2018-10-20 DIAGNOSIS — E1151 Type 2 diabetes mellitus with diabetic peripheral angiopathy without gangrene: Secondary | ICD-10-CM | POA: Diagnosis not present

## 2018-10-20 DIAGNOSIS — R2689 Other abnormalities of gait and mobility: Secondary | ICD-10-CM | POA: Diagnosis not present

## 2018-10-20 DIAGNOSIS — D649 Anemia, unspecified: Secondary | ICD-10-CM | POA: Diagnosis not present

## 2018-10-20 DIAGNOSIS — I350 Nonrheumatic aortic (valve) stenosis: Secondary | ICD-10-CM | POA: Diagnosis not present

## 2018-10-20 DIAGNOSIS — R296 Repeated falls: Secondary | ICD-10-CM | POA: Diagnosis not present

## 2018-10-20 DIAGNOSIS — I10 Essential (primary) hypertension: Secondary | ICD-10-CM | POA: Diagnosis not present

## 2018-10-20 DIAGNOSIS — E1140 Type 2 diabetes mellitus with diabetic neuropathy, unspecified: Secondary | ICD-10-CM | POA: Diagnosis not present

## 2018-10-22 DIAGNOSIS — R2689 Other abnormalities of gait and mobility: Secondary | ICD-10-CM | POA: Diagnosis not present

## 2018-10-22 DIAGNOSIS — E1151 Type 2 diabetes mellitus with diabetic peripheral angiopathy without gangrene: Secondary | ICD-10-CM | POA: Diagnosis not present

## 2018-10-22 DIAGNOSIS — E1140 Type 2 diabetes mellitus with diabetic neuropathy, unspecified: Secondary | ICD-10-CM | POA: Diagnosis not present

## 2018-10-22 DIAGNOSIS — I10 Essential (primary) hypertension: Secondary | ICD-10-CM | POA: Diagnosis not present

## 2018-10-22 DIAGNOSIS — M5136 Other intervertebral disc degeneration, lumbar region: Secondary | ICD-10-CM | POA: Diagnosis not present

## 2018-10-22 DIAGNOSIS — D649 Anemia, unspecified: Secondary | ICD-10-CM | POA: Diagnosis not present

## 2018-10-22 DIAGNOSIS — R296 Repeated falls: Secondary | ICD-10-CM | POA: Diagnosis not present

## 2018-10-22 DIAGNOSIS — I350 Nonrheumatic aortic (valve) stenosis: Secondary | ICD-10-CM | POA: Diagnosis not present

## 2018-10-27 DIAGNOSIS — R2689 Other abnormalities of gait and mobility: Secondary | ICD-10-CM | POA: Diagnosis not present

## 2018-10-27 DIAGNOSIS — R296 Repeated falls: Secondary | ICD-10-CM | POA: Diagnosis not present

## 2018-10-27 DIAGNOSIS — I350 Nonrheumatic aortic (valve) stenosis: Secondary | ICD-10-CM | POA: Diagnosis not present

## 2018-10-27 DIAGNOSIS — D649 Anemia, unspecified: Secondary | ICD-10-CM | POA: Diagnosis not present

## 2018-10-27 DIAGNOSIS — E1140 Type 2 diabetes mellitus with diabetic neuropathy, unspecified: Secondary | ICD-10-CM | POA: Diagnosis not present

## 2018-10-27 DIAGNOSIS — I10 Essential (primary) hypertension: Secondary | ICD-10-CM | POA: Diagnosis not present

## 2018-10-27 DIAGNOSIS — E1151 Type 2 diabetes mellitus with diabetic peripheral angiopathy without gangrene: Secondary | ICD-10-CM | POA: Diagnosis not present

## 2018-10-27 DIAGNOSIS — M5136 Other intervertebral disc degeneration, lumbar region: Secondary | ICD-10-CM | POA: Diagnosis not present

## 2018-10-29 ENCOUNTER — Ambulatory Visit: Payer: Self-pay | Admitting: *Deleted

## 2018-10-29 DIAGNOSIS — E1151 Type 2 diabetes mellitus with diabetic peripheral angiopathy without gangrene: Secondary | ICD-10-CM | POA: Diagnosis not present

## 2018-10-29 DIAGNOSIS — I10 Essential (primary) hypertension: Secondary | ICD-10-CM | POA: Diagnosis not present

## 2018-10-29 DIAGNOSIS — D649 Anemia, unspecified: Secondary | ICD-10-CM | POA: Diagnosis not present

## 2018-10-29 DIAGNOSIS — R2689 Other abnormalities of gait and mobility: Secondary | ICD-10-CM | POA: Diagnosis not present

## 2018-10-29 DIAGNOSIS — Z789 Other specified health status: Secondary | ICD-10-CM

## 2018-10-29 DIAGNOSIS — Z599 Problem related to housing and economic circumstances, unspecified: Secondary | ICD-10-CM

## 2018-10-29 DIAGNOSIS — R296 Repeated falls: Secondary | ICD-10-CM | POA: Diagnosis not present

## 2018-10-29 DIAGNOSIS — Z598 Other problems related to housing and economic circumstances: Secondary | ICD-10-CM

## 2018-10-29 DIAGNOSIS — E1140 Type 2 diabetes mellitus with diabetic neuropathy, unspecified: Secondary | ICD-10-CM | POA: Diagnosis not present

## 2018-10-29 DIAGNOSIS — I350 Nonrheumatic aortic (valve) stenosis: Secondary | ICD-10-CM | POA: Diagnosis not present

## 2018-10-29 DIAGNOSIS — M5136 Other intervertebral disc degeneration, lumbar region: Secondary | ICD-10-CM | POA: Diagnosis not present

## 2018-10-29 NOTE — Progress Notes (Signed)
  Chronic Care Management    Clinical Social Work Follow Up Note  10/29/2018 Name: Andrea Holden MRN: [id number] DOB: [birth year]  Andrea Holden is a 73 y.o. year old female who is a primary care patient of Lada, Janit Bern, MD. The CCM team was consulted for assistance with applying for Medicaid. Patient was denied for Medicaid, however she would now like assistance with completing an application for food stamps.  Review of patient status, including review of consultants reports, other relevant assessments, and collaboration with appropriate care team members and the patient's provider was performed as part of comprehensive patient evaluation and provision of chronic care management services.     Goals Addressed            This Visit's Progress   . I need help applying for Medicaid  (pt-stated)       Current Barriers:  . Financial constraints  Clinical Social Work Clinical Goal(s):  Marland Kitchen Over the next 30 days, client will work with SW to address concerns related to completion of community Medicaid application  Interventions: . Patient interviewed and appropriate assessments performed . Discussed plans with patient for ongoing care management follow up and provided patient with direct contact information for care management team . Confirmed with patient that she has received a denial letter from [facility]. She would now like to apply for food stamps. . Confirmed with patient ongoing plan to continue to organize/prioritize her household bills and make payment arrangements where needed. . Confirmed that patient will continue to utilize grocery delivery services to limit need to leave her home due to COVID-19.  Marland Kitchen Discussed that the food stamp application can be completed by phone and will be mailed to her for signature to be returned to the Department of Social Services   Patient Self Care Activities:  . Attends all scheduled provider appointments  Please see past updates related to  this goal by clicking on the "Past Updates" button in the selected goal         Follow Up Plan: SW will follow up with patient by phone over the next 2 weeks to assist with completing an application for food stamps   Verna Czech, LCSW Clinical Social Worker  [facility] Care Management [phone]

## 2018-10-29 NOTE — Patient Instructions (Signed)
Thank you allowing the Chronic Care Management Team to be a part of your care! It was a pleasure speaking with you today!  1. Please continue to engage with this social worker to assist with application for food stamps.       CCM (Chronic Care Management) Team   Yvone Neu RN, BSN Nurse Care Coordinator  [phone]  Karalee Height PharmD  Clinical Pharmacist  [phone]   Verna Czech, LCSW Clinical Social Worker [phone]  Goals Addressed            This Visit's Progress   . I need help applying for Medicaid  (pt-stated)       Current Barriers:  . Financial constraints  Clinical Social Work Clinical Goal(s):  Marland Kitchen Over the next 30 days, client will work with SW to address concerns related to completion of community Medicaid application  Interventions: . Patient interviewed and appropriate assessments performed . Discussed plans with patient for ongoing care management follow up and provided patient with direct contact information for care management team . Confirmed with patient that she has received a denial letter from [facility]. She would now like to apply for food stamps. . Confirmed with patient ongoing plan to continue to organize/prioritize her household bills and make payment arrangements where needed. . Confirmed that patient will continue to utilize grocery delivery services to limit need to leave her home due to COVID-19.  Marland Kitchen Discussed that the food stamp application can be completed by phone and will be mailed to her for signature to be returned to the Department of Social Services   Patient Self Care Activities:  . Attends all scheduled provider appointments  Please see past updates related to this goal by clicking on the "Past Updates" button in the selected goal         The patient verbalized understanding of instructions provided today and declined a print copy of patient instruction materials.   The CM team will reach out to the  patient again over the next 14 days.   This CCM social worker will assist patient with application for food stamps

## 2018-10-30 ENCOUNTER — Ambulatory Visit: Payer: Medicare HMO | Admitting: Nurse Practitioner

## 2018-10-30 ENCOUNTER — Other Ambulatory Visit: Payer: Self-pay

## 2018-11-02 ENCOUNTER — Encounter: Payer: Self-pay | Admitting: Nurse Practitioner

## 2018-11-02 ENCOUNTER — Telehealth (INDEPENDENT_AMBULATORY_CARE_PROVIDER_SITE_OTHER): Payer: Self-pay | Admitting: Vascular Surgery

## 2018-11-02 ENCOUNTER — Ambulatory Visit (INDEPENDENT_AMBULATORY_CARE_PROVIDER_SITE_OTHER): Payer: Medicare HMO | Admitting: Nurse Practitioner

## 2018-11-02 ENCOUNTER — Telehealth: Payer: Self-pay

## 2018-11-02 ENCOUNTER — Other Ambulatory Visit: Payer: Self-pay | Admitting: Nurse Practitioner

## 2018-11-02 VITALS — BP 128/80 | Temp 98.7°F

## 2018-11-02 DIAGNOSIS — K219 Gastro-esophageal reflux disease without esophagitis: Secondary | ICD-10-CM

## 2018-11-02 DIAGNOSIS — T148XXA Other injury of unspecified body region, initial encounter: Secondary | ICD-10-CM

## 2018-11-02 DIAGNOSIS — E782 Mixed hyperlipidemia: Secondary | ICD-10-CM

## 2018-11-02 DIAGNOSIS — M5136 Other intervertebral disc degeneration, lumbar region: Secondary | ICD-10-CM

## 2018-11-02 DIAGNOSIS — E669 Obesity, unspecified: Secondary | ICD-10-CM

## 2018-11-02 DIAGNOSIS — I119 Hypertensive heart disease without heart failure: Secondary | ICD-10-CM

## 2018-11-02 DIAGNOSIS — E1142 Type 2 diabetes mellitus with diabetic polyneuropathy: Secondary | ICD-10-CM

## 2018-11-02 DIAGNOSIS — M51369 Other intervertebral disc degeneration, lumbar region without mention of lumbar back pain or lower extremity pain: Secondary | ICD-10-CM

## 2018-11-02 DIAGNOSIS — I739 Peripheral vascular disease, unspecified: Secondary | ICD-10-CM

## 2018-11-02 DIAGNOSIS — F3341 Major depressive disorder, recurrent, in partial remission: Secondary | ICD-10-CM

## 2018-11-02 DIAGNOSIS — E1169 Type 2 diabetes mellitus with other specified complication: Secondary | ICD-10-CM

## 2018-11-02 MED ORDER — LISINOPRIL 10 MG PO TABS: 10.0000 mg | ORAL_TABLET | Freq: Every day | ORAL | 1 refills | Status: DC

## 2018-11-02 MED ORDER — GABAPENTIN 400 MG PO CAPS: 400.0000 mg | ORAL_CAPSULE | Freq: Two times a day (BID) | ORAL | 1 refills | Status: DC

## 2018-11-02 MED ORDER — ATORVASTATIN CALCIUM 10 MG PO TABS: 10.0000 mg | ORAL_TABLET | Freq: Every day | ORAL | 1 refills | Status: DC

## 2018-11-02 MED ORDER — SERTRALINE HCL 100 MG PO TABS: 150.0000 mg | ORAL_TABLET | Freq: Every day | ORAL | 1 refills | Status: DC

## 2018-11-02 MED ORDER — PANTOPRAZOLE SODIUM 20 MG PO TBEC: 20.0000 mg | DELAYED_RELEASE_TABLET | Freq: Every day | ORAL | 0 refills | Status: DC

## 2018-11-02 MED ORDER — MULTIVITAMIN ADULTS 50+ PO TABS: 1.0000 | ORAL_TABLET | Freq: Every day | ORAL | 1 refills | Status: AC

## 2018-11-02 MED ORDER — FLUTICASONE PROPIONATE 50 MCG/ACT NA SUSP: 2.0000 | Freq: Every day | NASAL | 4 refills | Status: DC | PRN

## 2018-11-02 NOTE — Telephone Encounter (Signed)
Pt wants rx for flonase

## 2018-11-02 NOTE — Telephone Encounter (Signed)
Patient called stating that Plavix was causing excessive bruising and bleeding. Would like to either lower dose or change medication. Pt is concerned that the Plavix is to strong. Please advise. AS, CMA

## 2018-11-02 NOTE — Progress Notes (Signed)
Virtual Visit via Telephone Note  I connected with Andrea Holden on 11/02/18 at  1:40 PM EDT by telephone and verified that I am speaking with the correct person using two identifiers.   Staff discussed the limitations, risks, security and privacy concerns of performing an evaluation and management service by telephone and the availability of in person appointments. Staff also discussed with the patient that there may be a patient responsible charge related to this service. The patient expressed understanding and agreed to proceed.  Patients location: home  My location: work office  Other people in meeting: none    HPI Hypertension Takes lisinopril  daily without missed doses BP Readings from Last 3 Encounters:  10/30/18 128/80  09/04/18 (!) 182/65  08/14/18 (!) 206/68   Peripheral artery disease Follows up vascular; rx plavix notices easy bruising, states tried calling them without response   Hyperlipidemia patient has CAD and atherosclerosis; atorvastatin 10 mg nightly  Lab Results  Component Value Date   CHOL 132 06/05/2018   HDL 45 (L) 06/05/2018   LDLCALC 66 06/05/2018   TRIG 133 06/05/2018   CHOLHDL 2.9 06/05/2018   Diabetes Diet controlled. Eats a lot of protein and vegetable and cut down portions and snacking has lost weight states is 203lbs.   Neuropathy stable with gabapentin, doing physical therapy as well.  Wt Readings from Last 3 Encounters:  09/04/18 210 lb (95.3 kg)  08/14/18 214 lb (97.1 kg)  08/04/18 214 lb 12.8 oz (97.4 kg)   Lab Results  Component Value Date   HGBA1C 6.8 (H) 04/23/2018   Seasonal allergies States lots of sneezing and watery eyes whenever she goes outside.   GERD Patient takes protonix  daily, states acid-reflux controlled on this  PHQ2/9: Depression screen [facility] 2/9 11/02/2018 08/04/2018 06/05/2018 04/23/2018 12/12/2017  Decreased Interest 0 0 3 0 0  Down, Depressed, Hopeless 0  PHQ - 2 Score 0  Altered  sleeping 0 0 0 0 1  Tired, decreased energy 1 1 0 1 1  Change in appetite 0 3 0 0 0  Feeling bad or failure about yourself  0 3 1 0 0  Trouble concentrating 0 1 1 0 0  Moving slowly or fidgety/restless 0 0 0 0 0  Suicidal thoughts 0 0 0 0 0  PHQ-9 Score Difficult doing work/chores Not difficult at all Not difficult at all Not difficult at all Somewhat difficult Not difficult at all  Some recent data might be hidden     PHQ reviewed. Negative; taking zoloft  daily, working well.   Patient Active Problem List   Diagnosis Date Noted  . Lower extremity edema 07/31/2018  . Pressure injury of skin 07/24/2018  . Surgical site infection 07/23/2018  . Wound of left leg 07/23/2018  . Atherosclerosis of artery of extremity with rest pain (HCC) 07/08/2018  . Shortness of breath 06/02/2018  . Bruit 06/02/2018  . Coronary artery disease of native artery of native heart with stable angina pectoris (HCC) 06/02/2018  . Atherosclerosis of native arteries of extremity with intermittent claudication (HCC) 05/19/2018  . Peripheral arterial disease (HCC) 05/06/2018  . Personal history of colonic polyps   . Aortic stenosis 12/24/2017  . LVH (left ventricular hypertrophy) 12/24/2017  . Hypertension with heart disease 12/24/2017  . Left atrial dilatation 12/12/2017  . Cardiac murmur 12/12/2017  . Gastrojejunal ulcer 12/04/2017  . Hyperphosphatemia 12/04/2017  . Spinal stenosis of  lumbar region 06/18/2017  . Degeneration of lumbar intervertebral disc 06/02/2017  . Assistance needed with transportation 05/26/2017  . Financial difficulties 05/26/2017  . Needs assistance with community resources 05/26/2017  . Moderate recurrent major depression (HCC) 05/26/2017  . Non-healing wound of lower extremity 02/06/2017  . Status post ankle arthrodesis 12/11/2016  . Controlled substance agreement broken 10/11/2016  . Low back pain 09/25/2016  . Osteoarthritis of right subtalar joint  07/11/2016  . Posterior tibial tendinitis of right lower extremity 07/11/2016  . Breast cancer screening 06/18/2016  . Knee pain 04/03/2016  . Hyponatremia 03/01/2016  . High triglycerides 12/24/2015  . Pes planus of both feet 11/16/2015  . Plantar fasciitis of right foot 11/16/2015  . Heel spur 11/16/2015  . Diabetic neuropathy (HCC) 11/16/2015  . Right ankle pain 11/16/2015  . Medication monitoring encounter 11/16/2015  . Chronic pain of multiple joints 08/15/2015  . Abnormal mammogram of right breast 06/19/2015  . Cerumen impaction 03/16/2015  . Hyperlipidemia 12/14/2014  . History of transfusion of packed RBC 12/14/2014  . Hx of smoking 12/14/2014  . Status post bariatric surgery 12/14/2014  . Morbid obesity (HCC) 12/14/2014  . Chronic radicular lumbar pain 12/14/2014  . History of GI bleed 11/12/2014  . GERD without esophagitis 11/12/2014  . History of small bowel obstruction 09/07/2014    Past Medical History:  Diagnosis Date  . Allergy    seasonal allergies  . Anemia 2014   needed 5 units of blood d/t passing out, weak  . Anxiety   . Aortic stenosis 12/24/2017   Echo Aug 2018  . Arthritis   . Asthma    allergy induced asthma  . Cardiac murmur 12/12/2017  . Cataract   . Cataract    left  . Complication of anesthesia    arrhythmia following colonoscopy  . Degenerative disc disease, lumbar   . Degenerative disc disease, lumbar   . Depression   . Diabetes mellitus   . Diabetic neuropathy (HCC) 11/16/2015  . Dysrhythmia    stenosis of left ventricle  . GERD (gastroesophageal reflux disease)   . H/O transfusion    patient was given 5 units of blood while at [facility], blood type O+  . History of chicken pox   . History of hiatal hernia   . History of measles, mumps, or rubella   . HOH (hard of hearing)    does not use hearing aides yet  . Hyperlipidemia   . Hypertension   . Irregular heartbeat   . LVH (left ventricular hypertrophy) 12/24/2017   Echo Aug 2018   . Neuropathy   . Opiate use 11/16/2015  . Peripheral arterial disease (HCC) 05/06/2018   At rest, left > right; refer to vasc  . Pes planus of both feet 11/16/2015  . Plantar fasciitis of right foot 11/16/2015  . Wheezing     Past Surgical History:  Procedure Laterality Date  . ABDOMINAL HYSTERECTOMY    . APPLICATION OF WOUND VAC Left 07/23/2018   Procedure: APPLICATION OF WOUND VAC;  Surgeon: Annice Needy, MD;  Location: [facility] ORS;  Service: Vascular;  Laterality: Left;  . banck injections    . CATARACT EXTRACTION W/PHACO Right 05/30/2015   Procedure: CATARACT EXTRACTION PHACO AND INTRAOCULAR LENS PLACEMENT (IOC);  Surgeon: Galen Manila, MD;  Location: [facility] ORS;  Service: Ophthalmology;  Laterality: Right;  [place] 00:57 AP% 20.9 CDE 11.99 fluid pack lot #[id number]  . CHOLECYSTECTOMY  1970  . COLON SURGERY  2013   blocked  colon  . COLONOSCOPY WITH PROPOFOL N/A 01/20/2018   Procedure: COLONOSCOPY WITH PROPOFOL;  Surgeon: Midge Minium, MD;  Location[facility] ENDOSCOPY;  Service: Endoscopy;  Laterality: N/A;  . ENDARTERECTOMY FEMORAL Left 07/08/2018   Procedure: ENDARTERECTOMY FEMORAL;  Surgeon: Annice Needy, MD;  Location: [facility] ORS;  Service: Vascular;  Laterality: Left;  . EYE SURGERY Right    cataract surgery  . FOOT FUSION Right 2018   metal in foot  . GALLBLADDER SURGERY  1970  . GASTRIC BYPASS  2010   lost 178 lbs and regained 40 lbs last few years  . HUMERUS FRACTURE SURGERY Right    metal plate with screws  . internal bleeding  2016   ulcer in past  . LOWER EXTREMITY ANGIOGRAM Left 07/08/2018   Procedure: LOWER EXTREMITY ANGIOGRAM;  Surgeon: Annice Needy, MD;  Location: [facility] ORS;  Service: Vascular;  Laterality: Left;  . LOWER EXTREMITY ANGIOGRAPHY Left 05/27/2018   Procedure: LOWER EXTREMITY ANGIOGRAPHY;  Surgeon: Annice Needy, MD;  Location: [facility] INVASIVE CV LAB;  Service: Cardiovascular;  Laterality: Left;  . MOUTH SURGERY     root canals and crowns and extractions  . OVARY  SURGERY    . SKIN GRAFT Right 2018   RT foot. foot has been rebuilt.  it is full of metal  . TENOTOMY ACHILLES TENDON Right    Percuntaneous. metal in foot  . TONSILLECTOMY    . WOUND DEBRIDEMENT Left 07/23/2018   Procedure: DEBRIDEMENT WOUND;  Surgeon: Annice Needy, MD;  Location: [facility] ORS;  Service: Vascular;  Laterality: Left;    Social History   Tobacco Use  . Smoking status: Former Smoker    Packs/day: 0.50    Years: 55.00    Pack years: 27.50    Types: Cigarettes    Start date: 07/01/1960    Last attempt to quit: 05/26/2018    Years since quitting: 0.4  . Smokeless tobacco: Never Used  . Tobacco comment: still using chantix  Substance Use Topics  . Alcohol use: Yes    Alcohol/week: 0.0 standard drinks    Comment: 2 drinks per month     Current Outpatient Medications:  .  acetaminophen (TYLENOL) 325 MG tablet, Take 325 mg by mouth every 6 (six) hours as needed. , Disp: , Rfl:  .  albuterol (PROVENTIL HFA;VENTOLIN HFA) 108 (90 Base) MCG/ACT inhaler, Inhale 2 puffs into the lungs every 6 (six) hours as needed for wheezing or shortness of breath., Disp: 1 Inhaler, Rfl: 1 .  atorvastatin (LIPITOR) 10 MG tablet, Take 1 tablet (10 mg total) by mouth at bedtime., Disp: 90 tablet, Rfl: 1 .  calcium carbonate (TUMS - DOSED IN MG ELEMENTAL CALCIUM) 500 MG chewable tablet, Chew 3 tablets by mouth daily. Takes as she remembers, Disp: , Rfl:  .  CHANTIX 1 MG tablet, , Disp: , Rfl:  .  clopidogrel (PLAVIX) 75 MG tablet, TAKE 1 TABLET BY MOUTH ONCE DAILY, Disp: 90 tablet, Rfl: 0 .  co-enzyme Q-10 30 MG capsule, Take 100 mg by mouth daily. , Disp: , Rfl:  .  fluticasone (FLONASE) 50 MCG/ACT nasal spray, Place 2 sprays into the nose daily as needed., Disp: , Rfl:  .  gabapentin (NEURONTIN) 400 MG capsule, Take 1 capsule (400 mg total) by mouth 2 (two) times daily., Disp: 180 capsule, Rfl: 1 .  HYDROcodone-acetaminophen (NORCO) 5-325 MG tablet, Take 0.5 tablets by mouth every 6 (six) hours as  needed for moderate pain., Disp: 30 tablet, Rfl:  0 .  lisinopril (PRINIVIL,ZESTRIL) 10 MG tablet, TAKE 1 TABLET BY MOUTH ONCE DAILY, Disp: 90 tablet, Rfl: 1 .  Melatonin 5 MG TABS, Take 1 tablet by mouth at bedtime. , Disp: , Rfl:  .  Multiple Vitamins-Minerals (MULTIVITAMIN ADULTS 50+) TABS, Take 1 tablet by mouth daily. , Disp: , Rfl:  .  pantoprazole (PROTONIX) 20 MG tablet, Take 1 tablet (20 mg total) by mouth daily., Disp: 30 tablet, Rfl: 0 .  sertraline (ZOLOFT) 100 MG tablet, TAKE 1 AND 1/2 TABLETS BY MOUTH DAILY FOR A TOTAL OF 150 MG (Patient taking differently: Take 150 mg by mouth daily. ), Disp: 135 tablet, Rfl: 1 .  silver sulfADIAZINE (SILVADENE) 1 % cream, Apply 1 application topically daily., Disp: 50 g, Rfl: 0 .  varenicline (CHANTIX STARTING MONTH PAK) 0.5 MG X 11 & 1 MG X 42 tablet, Take 0.5 mg by mouth once daily for 3 days, then 0.5 mg twice daily for 4 days, then one 1 mg tablet twice daily, Disp: 53 tablet, Rfl: 0 .  vitamin C (ASCORBIC ACID) 500 MG tablet, Take 500 mg by mouth daily. , Disp: , Rfl:   Allergies  Allergen Reactions  . Meloxicam Other (See Comments)    GI bleeding  . Nsaids Other (See Comments)    Gi bleeding  . Sitagliptin Hives and Itching    Januvia     ROS   No other specific complaints in a complete review of systems (except as listed in HPI above).  Objective  Vitals:   10/30/18 1155  BP: 128/80  Temp: 98.7 F (37.1 C)  TempSrc: Oral  SpO2: 95%     There is no height or weight on file to calculate BMI.  Patient is alert, able to speak in full sentences without difficulty.   Assessment & Plan  1. Hypertension with heart disease Well controlled per home readings, continue medications.  - lisinopril (ZESTRIL) 10 MG tablet; Take 1 tablet (10 mg total) by mouth daily.  Dispense: 90 tablet; Refill: 1 - Multiple Vitamins-Minerals (MULTIVITAMIN ADULTS 50+) TABS; Take 1 tablet by mouth daily.  Dispense: 90 tablet; Refill: 1  2.  Peripheral arterial disease (HCC) Continue vascular follow-up   3. Mixed hyperlipidemia stable - atorvastatin (LIPITOR) 10 MG tablet; Take 1 tablet (10 mg total) by mouth at bedtime.  Dispense: 90 tablet; Refill: 1  4. Diabetes mellitus type 2 in obese [facility](HCC) Diet controlled, has been losing weight.   5. GERD without esophagitis Avoid triggers, return to famotidine when able  - pantoprazole (PROTONIX) 20 MG tablet; Take 1 tablet (20 mg total) by mouth daily.  Dispense: 30 tablet; Refill: 0 - Multiple Vitamins-Minerals (MULTIVITAMIN ADULTS 50+) TABS; Take 1 tablet by mouth daily.  Dispense: 90 tablet; Refill: 1  6. Diabetic polyneuropathy associated with type 2 diabetes mellitus (HCC) stable - gabapentin (NEURONTIN) 400 MG capsule; Take 1 capsule (400 mg total) by mouth 2 (two) times daily.  Dispense: 180 capsule; Refill: 1 - Multiple Vitamins-Minerals (MULTIVITAMIN ADULTS 50+) TABS; Take 1 tablet by mouth daily.  Dispense: 90 tablet; Refill: 1  7. Recurrent major depressive disorder, in partial remission (HCC) stable - sertraline (ZOLOFT) 100 MG tablet; Take 1.5 tablets (150 mg total) by mouth daily.  Dispense: 135 tablet; Refill: 1  8. Degeneration of lumbar intervertebral disc Patient notes decrease ROM would like PT  - Ambulatory referral to Physical Therapy  9. Bruising Patient to discuss plavix with vascular, CMA called to leave message with office to  call her.     I discussed the assessment and treatment plan with the patient. The patient was provided an opportunity to ask questions and all were answered. The patient agreed with the plan and demonstrated an understanding of the instructions.   The patient was advised to call back or seek an in-person evaluation if the symptoms worsen or if the condition fails to improve as anticipated.  I provided 21 minutes of non-face-to-face time during this encounter.   Cheryle Horsfall, NP

## 2018-11-03 NOTE — Telephone Encounter (Signed)
Patient is aware of the below and verbalized understanding. AS, CMA 

## 2018-11-04 ENCOUNTER — Telehealth (INDEPENDENT_AMBULATORY_CARE_PROVIDER_SITE_OTHER): Payer: Self-pay

## 2018-11-04 ENCOUNTER — Encounter (INDEPENDENT_AMBULATORY_CARE_PROVIDER_SITE_OTHER): Payer: Self-pay

## 2018-11-04 NOTE — Telephone Encounter (Signed)
Spoke with the patient regarding getting scheduled for her leg angio, the patient was offered Monday 11/09/2018 or Thursday 11/12/2018. The patient refused both appt and requested to be scheduled for June 12/07/2018

## 2018-11-05 ENCOUNTER — Ambulatory Visit: Payer: Self-pay | Admitting: *Deleted

## 2018-11-05 ENCOUNTER — Telehealth: Payer: Self-pay

## 2018-11-05 NOTE — Chronic Care Management (AMB) (Signed)
   Chronic Care Management   Unsuccessful Call Note 11/05/2018 Name: Andrea Holden MRN: [id number] DOB: 1945/09/29      Andrea Holden is a 73 y.o. year old female who is a primary care patient of Lada, Janit Bern, MD. The CCM team was consulted for assistance with applying for Medicaid. Patient was denied for Medicaid, however she would now like assistance with completing an application for food stamps. HIPAA compliant voicemail asking patient to return my call. (unsuccessful outreach #1).   Plan: Will follow-up within 7 business days via telephone.     Verna Czech, LCSW [facility] Care Management [phone]

## 2018-11-06 ENCOUNTER — Ambulatory Visit: Payer: Self-pay | Admitting: *Deleted

## 2018-11-06 DIAGNOSIS — Z598 Other problems related to housing and economic circumstances: Secondary | ICD-10-CM

## 2018-11-06 DIAGNOSIS — Z789 Other specified health status: Secondary | ICD-10-CM

## 2018-11-06 DIAGNOSIS — Z599 Problem related to housing and economic circumstances, unspecified: Secondary | ICD-10-CM

## 2018-11-06 NOTE — Chronic Care Management (AMB) (Signed)
   Chronic Care Management    Clinical Social Work Follow Up Note  11/06/2018 Name: Andrea Holden MRN: [id number] DOB: 1946/01/23  Andrea Holden is a 73 y.o. year old female who is a primary care patient of Lada, Janit Bern, MD. The CCM team was consulted for assistance with applying for food stamps.   Review of patient status, including review of consultants reports, other relevant assessments, and collaboration with appropriate care team members and the patient's provider was performed as part of comprehensive patient evaluation and provision of chronic care management services.     Goals Addressed            This Visit's Progress   . I need help applying for food stamps (pt-stated)       This social worker spoke with patient today by phone. Patient discussed being denied for Medicaid due to her income but wanted to apply for food stamps for possible approval. Per patient, she feels that her finances are now under control after receiving her stimulus check and re-organizing her bills and making arrangements.  Patient further discussed that the procedure to place stints in her leg is scheduled for 12/07/18. Per patient, it will be a day surgery.  Current Barriers:  . Financial constraints . Limited social support . Family and relationship dysfunction . Social Isolation  Clinical Social Work Clinical Goal(s):  Marland Kitchen Over the next 30 days, client will work with SW to address concerns related to applying for food stamps  Interventions: . Patient interviewed and appropriate assessments performed . Provided patient with information about application and eligibility process for food stamps . Discussed plans with patient for ongoing care management follow up and provided patient with direct contact information for care management team . Advised patient to sign completed application once received and mail it to the Department of Social Services for review . Completed food stamp  application with patient by phone  Patient Self Care Activities:  . Self administers medications as prescribed . Attends all scheduled provider appointments . Performs ADL's independently . Performs IADL's independently  Initial goal documentation         Follow Up Plan: SW will follow up with patient by phone over the next 2 weeks   .cls

## 2018-11-06 NOTE — Patient Instructions (Addendum)
Thank you allowing the Chronic Care Management Team to be a part of your care! It was a pleasure speaking with you today!  1. Please sign and return completed food stamp application in enclosed envelope to the Department of Social Services 2. Please call the CCM social worker with questions or concerns regarding the food stamp application process.   CCM (Chronic Care Management) Team   Yvone Neu RN, BSN Nurse Care Coordinator  [phone]  Karalee Height PharmD  Clinical Pharmacist  [phone]   Verna Czech, LCSW Clinical Social Worker [phone]  Goals Addressed            This Visit's Progress   . I need help applying for food stamps (pt-stated)       Current Barriers:  . Financial constraints . Limited social support . Family and relationship dysfunction . Social Isolation  Clinical Social Work Clinical Goal(s):  Marland Kitchen Over the next 30 days, client will work with SW to address concerns related to applying for food stamps  Interventions: . Patient interviewed and appropriate assessments performed . Provided patient with information about application and eligibility process for food stamps . Discussed plans with patient for ongoing care management follow up and provided patient with direct contact information for care management team . Advised patient to sign completed application once received and mail it to the Department of Social Services for review  . Completed food stamp application with patient by phone  Patient Self Care Activities:  . Self administers medications as prescribed . Attends all scheduled provider appointments . Performs ADL's independently . Performs IADL's independently  Initial goal documentation     . COMPLETED: I need help applying for Medicaid  (pt-stated)       Current Barriers:  . Financial constraints  Clinical Social Work Clinical Goal(s):  Marland Kitchen Over the next 30 days, client will work with SW to address concerns related  to completion of community Medicaid application  Interventions: . Patient interviewed and appropriate assessments performed . Discussed plans with patient for ongoing care management follow up and provided patient with direct contact information for care management team . Confirmed with patient that she has received a denial letter from [facility]. She would now like to apply for food stamps. . Confirmed with patient ongoing plan to continue to organize/prioritize her household bills and make payment arrangements where needed. . Confirmed that patient will continue to utilize grocery delivery services to limit need to leave her home due to COVID-19.  Marland Kitchen Discussed that the food stamp application can be completed by phone and will be mailed to her for signature to be returned to the Department of Social Services   Patient Self Care Activities:  . Attends all scheduled provider appointments  Please see past updates related to this goal by clicking on the "Past Updates" button in the selected goal         The patient verbalized understanding of instructions provided today and declined a print copy of patient instruction materials.   The CM team will reach out to the patient again over the next 14 days.

## 2018-11-09 ENCOUNTER — Ambulatory Visit: Payer: Self-pay | Admitting: *Deleted

## 2018-11-09 DIAGNOSIS — Z598 Other problems related to housing and economic circumstances: Secondary | ICD-10-CM

## 2018-11-09 DIAGNOSIS — Z599 Problem related to housing and economic circumstances, unspecified: Secondary | ICD-10-CM

## 2018-11-09 DIAGNOSIS — Z789 Other specified health status: Secondary | ICD-10-CM

## 2018-11-09 NOTE — Chronic Care Management (AMB) (Signed)
  Chronic Care Management    Clinical Social Work Follow Up Note  11/09/2018 Name: Andrea Holden MRN: [id number] DOB: May 28, 1946  Andrea Holden is a 73 y.o. year old female who is a primary care patient of Lada, Janit Bern, MD. The CCM team was consulted for assistance with Food Insecurity. Patient requested assistance with completing a food stamp application.  Completed food stamp application mailed today to patient for signature and return to the Department of Social Services.  Review of patient status, including review of consultants reports, other relevant assessments, and collaboration with appropriate care team members and the patient's provider was performed as part of comprehensive patient evaluation and provision of chronic care management services.     Goals Addressed   None      Follow Up Plan: SW will follow up with patient by phone over the next 2 weeks   Chrystal Land, LCSW Clinical Social Worker  [facility] Care Management [phone]

## 2018-11-10 ENCOUNTER — Other Ambulatory Visit (INDEPENDENT_AMBULATORY_CARE_PROVIDER_SITE_OTHER): Payer: Self-pay | Admitting: Vascular Surgery

## 2018-11-12 ENCOUNTER — Telehealth: Payer: Self-pay

## 2018-11-17 ENCOUNTER — Ambulatory Visit: Payer: Medicare Other | Attending: Nurse Practitioner | Admitting: Physical Therapy

## 2018-11-17 ENCOUNTER — Encounter: Payer: Self-pay | Admitting: Physical Therapy

## 2018-11-17 ENCOUNTER — Other Ambulatory Visit: Payer: Self-pay

## 2018-11-17 VITALS — BP 150/56

## 2018-11-17 DIAGNOSIS — Z9181 History of falling: Secondary | ICD-10-CM

## 2018-11-17 DIAGNOSIS — M545 Low back pain, unspecified: Secondary | ICD-10-CM

## 2018-11-17 DIAGNOSIS — G8929 Other chronic pain: Secondary | ICD-10-CM | POA: Insufficient documentation

## 2018-11-17 DIAGNOSIS — M6281 Muscle weakness (generalized): Secondary | ICD-10-CM

## 2018-11-17 DIAGNOSIS — R262 Difficulty in walking, not elsewhere classified: Secondary | ICD-10-CM | POA: Diagnosis not present

## 2018-11-17 NOTE — Therapy (Signed)
[facility] [address] [place], [place], [address] Phone: [phone]   Fax:  [phone]  Physical Therapy Evaluation  Patient Details  Name: Andrea Holden MRN: [id number] Date of Birth: [birth year] Referring Provider (PT): Cheryle HorsfallPoulose, Elizabeth E, NP    Encounter Date: 11/17/2018  PT End of Session - 11/17/18 1326    Visit Number  1    Number of Visits  24    Date for PT Re-Evaluation  02/09/19    Authorization Type  [facility] Medicare reporting period from 01/12/2019    Authorization Time Period  Current cert period 6/96/29525/19/2020 - 02/03/19    Authorization - Visit Number  1    Authorization - Number of Visits  10    PT Start Time  1050   patient arrived 20 min late   PT Stop Time  1200    PT Time Calculation (min)  70 min    Activity Tolerance  Patient tolerated treatment well;Patient limited by fatigue;Patient limited by pain    Behavior During Therapy  [facility] for tasks assessed/performed       Past Medical History:  Diagnosis Date  . Allergy    seasonal allergies  . Anemia 2014   needed 5 units of blood d/t passing out, weak  . Anxiety   . Aortic stenosis 12/24/2017   Echo Aug 2018  . Arthritis   . Asthma    allergy induced asthma  . Cardiac murmur 12/12/2017  . Cataract   . Cataract    left  . Complication of anesthesia    arrhythmia following colonoscopy  . Degenerative disc disease, lumbar   . Degenerative disc disease, lumbar   . Depression   . Diabetes mellitus   . Diabetic neuropathy (HCC) 11/16/2015  . Dysrhythmia    stenosis of left ventricle  . GERD (gastroesophageal reflux disease)   . H/O transfusion    patient [facility] units [facility], blood type O+  . History of chicken pox   . History of hiatal hernia   . History of measles, mumps, or rubella   . HOH (hard of hearing)    does not use hearing aides yet  . Hyperlipidemia   . Hypertension   . Irregular heartbeat    . LVH (left ventricular hypertrophy) 12/24/2017   Echo Aug 2018  . Neuropathy   . Opiate use 11/16/2015  . Peripheral arterial disease (HCC) 05/06/2018   At rest, left > right; refer to vasc  . Pes planus of both feet 11/16/2015  . Plantar fasciitis of right foot 11/16/2015  . Wheezing     Past Surgical History:  Procedure Laterality Date  . ABDOMINAL HYSTERECTOMY    . APPLICATION OF WOUND VAC Left 07/23/2018   Procedure: APPLICATION OF WOUND VAC;  Surgeon: Annice Needyew, Jason S, MD;  Location: [facility] ORS;  Service: Vascular;  Laterality: Left;  . banck injections    . CATARACT EXTRACTION W/PHACO Right 05/30/2015   Procedure: CATARACT EXTRACTION PHACO AND INTRAOCULAR LENS PLACEMENT (IOC);  Surgeon: Galen ManilaWilliam Porfilio, MD;  Location: [facility] ORS;  Service: Ophthalmology;  Laterality: Right;  US 00:57 AP% 20.9 CDE 11.99 fluid pack lot #[id number]  . CHOLECYSTECTOMY  1970  . COLON SURGERY  2013   blocked colon  . COLONOSCOPY WITH PROPOFOL N/A 01/20/2018   Procedure: COLONOSCOPY WITH PROPOFOL;  Surgeon: Midge MiniumWohl, Darren, MD;  [facility];  Service: Endoscopy;  Laterality: N/A;  . ENDARTERECTOMY FEMORAL  Left 07/08/2018   Procedure: ENDARTERECTOMY FEMORAL;  Surgeon: Annice Needy, MD;  Location: [facility] ORS;  Service: Vascular;  Laterality: Left;  . EYE SURGERY Right    cataract surgery  . FOOT FUSION Right 2018   metal in foot  . GALLBLADDER SURGERY  1970  . GASTRIC BYPASS  2010   lost 178 lbs and regained 40 lbs last few years  . HUMERUS FRACTURE SURGERY Right    metal plate with screws  . internal bleeding  2016   ulcer in past  . LOWER EXTREMITY ANGIOGRAM Left 07/08/2018   Procedure: LOWER EXTREMITY ANGIOGRAM;  Surgeon: Annice Needy, MD;  Location: [facility] ORS;  Service: Vascular;  Laterality: Left;  . LOWER EXTREMITY ANGIOGRAPHY Left 05/27/2018   Procedure: LOWER EXTREMITY ANGIOGRAPHY;  Surgeon: Annice Needy, MD;  Location: [facility] INVASIVE CV LAB;  Service: Cardiovascular;  Laterality: Left;  . MOUTH  SURGERY     root canals and crowns and extractions  . OVARY SURGERY    . SKIN GRAFT Right 2018   RT foot. foot has been rebuilt.  it is full of metal  . TENOTOMY ACHILLES TENDON Right    Percuntaneous. metal in foot  . TONSILLECTOMY    . WOUND DEBRIDEMENT Left 07/23/2018   Procedure: DEBRIDEMENT WOUND;  Surgeon: Annice Needy, MD;  Location: [facility] ORS;  Service: Vascular;  Laterality: Left;    Vitals:   11/17/18 1108  BP: (!) 150/56     Subjective Assessment - 11/17/18 1108    Subjective  Patient reports she has chronic low back pain and she usually controls it with cortisone injections. There has been a delay in getting the shots due to COVID19 but she is hoping to get one soon. She came to physical therapy for pain relief and to improve function. She is planning to go to [place] in July and would like to be better prepared physically. She reports no open wounds currently but has skin graft on R foot that requires compression socks. She has two dogs, a shit tzu and an 2 month Psychologist, clinical that she obtained from [place]. She has a long history of dog showing (obedience and confirmation) and really misses it. She would like to be more involved and be able to show her [place]. She has difficulty getting down on one knee, position the dog, bending over to position the feet. She also lacks neccessary balance and and is unable to run. Would love to be able to get back to participating in dog training and showing in moderation. would need to gait the dog around the ring. Running is required. She still goes to the shows and watch. Has difficulty getting chair to ring, moving chair around. Has not been able to train the dog how she wants because she cannot walk around the back yard. She [place] by a busy street. She feel recently when the dog wrapped the leash around her and spun her to the ground outside. EMS was called and she still has large area of bruising over abdomen but no serious injuries at  this point. She uses a lifeline service that automatically calls if she falls (but it doesn't work away from home due to lack of [employer] coverage). She wants to learn how to walk without a cane. She currently uses an Engineer, petroleum at the grocery store. Currently uses [employer] so she can get up from the toilet. She has had in home PT for the last month up to last  week. She was using a walker for ambulation but is now using a cane and feels she bends over too much Would like to be able to stand up taller. 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 She feels her pain and disability is currently under control but very restrictive and not allowing her quality of life.     Pertinent History  Patient is a 73 y.o. female who presents to outpatient physical therapy with a referral for medical diagnosis of degeneration of lumbar intervertebral disc. This patient's chief complaints consist of chronic low back pain and foot pain, weakness, stiffness, leading to the following functional deficits: difficulty with with basic ADLs, IADLs, ambulation. She used to get injections to help control back pain but it has recently worsened due to being unable to get injections during COVID19 pandemic. The patient has been informed of current processes in place at Outpatient Rehab to protect patients from Covid-19 exposure including social distancing, schedule modifications, and new cleaning procedures. After discussing their particular risk with a therapist based on the patient's personal risk factors, the patient has decided to proceed with in-person therapy. Pt is scheduled for R LE angiography on 12/07/2018. She has recently started smoking again with the stress of COVID19 pandemic and feels strongly she needs to stop. Surgeon has stated in the past he will not complete surgery if she is smoking.  Relevant past medical history and comorbidities include underwent extensive vascular  surgeries in Jan 2020 to improve blood flow, similar surgery for R leg scheduled 12/07/2018. Has a history of left carotid artery stenosis, but not enough to have surgery, has a history of multiple arterial surgeries, history of R cataract surgery, colon surgery, cholecystectomy, R foot skin graft, ovary surgery, gastric bypass, R foot fusion, abdominal hysterectomy; current smoker, plantar fasciitis, peripheral arterial disease, LVH, diabetes mellitus with neuropathy, irregular heartbeat, hiatal hernia, GERD, stenosis of left heart ventricle, depression, lumbar degenerative disc disease, cardiac murmur, asthma, anxiety, obesity, HTN, and seasonal allergies She has been advised not to lift over 10 pounds by physician    Limitations  Sitting;Standing;Walking;House hold activities;Lifting    How long can you sit comfortably?  <1 hour    How long can you stand comfortably?  1 minute    How long can you walk comfortably?  2 minutes, depends on what she has with her. Able to walk at fair with rollator. To mailbox 120 feet causes shaking in R leg.      Diagnostic tests  MRI lumbar spine report 06/14/2017: "IMPRESSION: 1. Stable degenerative of lumbar spondylosis and scoliosis with multilevel disc disease and facet disease. 2. Stable right foraminal stenosis at L5-S1 due to right-sided disc    Patient Stated Goals  patient would like to get back to PLOF, walking further, be better able to participate in dog showing and trips    Currently in Pain?  Yes    Pain Score  7     best 3/10, at worst 10/10.   Pain Location  Back   low back bilaterally over both sides and into buttocks. Sometimes sciatic hurts to buttocks. Left side is usually worse than right, but lately right has been worse.    Pain Orientation  Posterior    Pain Descriptors / Indicators  Aching;Shooting   Paresthesia: neuropathy in both legs and arms; Nature: varies depending on what she is doing. Really affects walking    Pain Type  Chronic pain     Pain Radiating Towards  low back bilaterally over both sides and into buttocks. Sometimes sciatic hurts to buttocks. Left side is usually worse than right, but lately right has been worse.     Pain Onset  More than a month ago    Pain Frequency  Intermittent    Aggravating Factors   feels like when walking legs are losing oxygen, walking, stairs, standing, bending, lifting    Pain Relieving Factors  oxycodone, sitting.    Effect of Pain on Daily Activities  difficulty with all daily tasks involving standing, walking, sitting. Unable to show dogs, difficulty moving chairs at dog shows, getting down on one knee, walk outside, walk dogs, unable to run or position dog.         [facility] PT Assessment - 11/17/18 0001      Assessment   Medical Diagnosis  Degeneration of lumbar intervertebral disc    Referring Provider (PT)  Poulose, Percell Belt, NP     Hand Dominance  Right    Prior Therapy  participated several times with improved function.       Precautions   Precautions  Other (comment)   no lifting over 10#     Restrictions   Weight Bearing Restrictions  No      Balance Screen   Has the patient fallen in the past 6 months  Yes   last fall last month   How many times?  2    Has the patient had a decrease in activity level because of a fear of falling?   Yes    Is the patient reluctant to leave their home because of a fear of falling?   No      Home Public house manager residence    Living Arrangements  Alone   with two dogs (Shit tzu and Psychologist, clinical).    Type of Home  House    Home Access  Stairs to enter    Entrance Stairs-Number of Steps  3    Entrance Stairs-Rails  Right;Left;Can reach both    Home Layout  One level    Home Equipment  Tub bench;Bedside commode;Other (comment);Cane - single point;Walker - 2 wheels;Grab bars - tub/shower;Grab bars - toilet   rollator     Prior Function   Level of Independence  Independent    Vocation  Part time employment    currently closed due to COVID 19 pandemic   Vocation Requirements  substitute teaching    Leisure  life revolves around dog showing (comfirmation and obedience), currently unable to do       Cognition   Overall Cognitive Status  Within Functional Limits for tasks assessed      Observation/Other Assessments   Observations  see note from 11/17/2018 for latest objective measures    Focus on Therapeutic Outcomes (FOTO)   42       OBJECTIVE: OBSERVATION/INSPECTION: Patient presents with excessive thoracic kyphosis, flattened lumbar lordosis, obesity. Sits with slumped posture. Able to stand with hips extended to neutral. Bilateral ankle pronation, R > L.   NEUROLOGICAL: Dermatomes: BLE equal and intact to light touch, except left L5 (pt states it always feels odd there), numbness related to past surgeries lateral R foot.  Myotomes: appears WNL. Patient has had extensive R foot surgery and is awaiting vascular surgery. Limited ROM in R ankle/foot.   SPINE MOTION  Lumbar AROM:  *Indicates pain - Flexion: = fingers to toes,  PDM. - Extension: = 25%. - Rotation: R = 50%, L = 25%. - Side Flexion: 25% bilaterally.   PERIPHERAL JOINT MOTION (AROM/PROM in degrees):  *Indicates pain BLE grossly WFL for basic ambulation and STS transfers. R ankle noted for decreased ROM compared to L.   STRENGTH:  *Indicates pain -  Hip  - Flexion: R = 4/5, L = 5/5. - Extension: R = 4/5, L = 4/5. - Abduction (seated): R = 3/5, L = 3/5. - Adduction (seated): R = 4+/5, L = 4+/5. Knee - Ext: R = 3/5, L = 5/5. - Flex: R = 4/5, L = 5/5 pain in hip. Ankle (seated position) - Dorsiflexion: R = 4/5, L = 4+/5. - Plantarflexion: R = 4/5 (limited range), L = 4/5. - Eversion: R = 4/5, L = 4/5. - Great toe ext: R = 5/5, L = 4/5.  FUNCTIONAL MOBILITY: - Bed mobility: testing deferred - Transfers: Sit to stand transfer from low plinth no UE support I with difficulty.  - Gait: ambulates household and short  community distances with SPC held in R UE. trendelenburg gait noted suggesting L lateral hip weakness. Decreased stance time on L LE. Uneven step, slow pace. Able to ambulate 1:43 seconds and 200 feet before needing to rest due to leg pain.  - Stairs: deferred  FUNCTIONAL/BALANCE TESTS:  5 Time Sit to Stand: 27 seconds from low plinth no UE support.   6 Minute Walk Test: 200 feet with SPC and supervision. Stopped at 1:43 due to fatigue and leg pain.   Balance testing deferred to next session.   EDUCATION/COGNITION: Patient is alert and oriented X 4.   Objective measurements completed on examination: See above findings.     TREATMENT:    Therapeutic exercise: to centralize symptoms and improve ROM, strength, muscular endurance, and activity tolerance required for successful completion of functional activities.  - Sit <> stand from low plinth without UE support x 5, cuing for hand placement. - ambulation with SPC for distance: 200 feet in 1:43 before tiring and requiring seated rest - Education on diagnosis, prognosis, POC, anatomy and physiology of current condition.  - Education on HEP    HOME EXERCISE PROGRAM Access Code: [email or web address]  URL: [email or web address]  Date: 11/17/2018  Prepared by: Norton Blizzard   Exercises  Sit to Stand without Arm Support - 3x daily    Patient response to treatment:  Pt tolerated evaluation and treatment well. Pt was able to complete all exercises with minimal to no lasting increase in pain or discomfort by end of session. She had increased pain and fatigue that required  Pt required multimodal cuing for proper technique and to facilitate improved neuromuscular control, strength, range of motion, and functional ability resulting in improved performance and form.    The patient has been informed of current processes in place at Outpatient Rehab to protect patients from Covid-19 exposure including social distancing, schedule modifications,  and new cleaning procedures. After discussing their particular risk with a therapist based on the patient's personal risk factors, the patient has decided to proceed with in-person therapy.   PT Education - 11/17/18 1057    Education provided  Yes    Education Details  Exercise purpose/form. Self management techniques. Education on diagnosis, prognosis, POC, anatomy and physiology of current condition Education on HEP including handout      Person(s) Educated  Patient    Methods  Explanation;Demonstration;Tactile cues;Verbal cues;Handout    Comprehension  Verbalized understanding;Returned demonstration;Verbal cues required;Tactile cues  required       PT Short Term Goals - 11/17/18 1925      PT SHORT TERM GOAL #1   Title  Be independent with initial home exercise program for self-management of symptoms.    Baseline  Initial HEP provided at IE (11/17/2018);    Time  2    Period  Weeks    Status  New    Target Date  12/01/18        PT Long Term Goals - 11/17/18 1926      PT LONG TERM GOAL #1   Title  Be independent with a long-term home exercise program for self-management of symptoms.     Baseline  Initial HEP provided at IE (11/17/2018);    Time  12    Period  Weeks    Status  New    Target Date  02/09/19      PT LONG TERM GOAL #2   Title  Patient will improve to equal or greater than 1000 feet with LRAD to demonstrate improved activity tolerance for community ambulation.     Baseline  6 Minute Walk Test: 200 feet with SPC and supervision. Stopped at 1:43 due to fatigue and leg pain. (11/17/2018);    Time  12    Period  Weeks    Status  New    Target Date  02/09/19      PT LONG TERM GOAL #3   Title  Patient will demonstrate improved 5 Times Sit to Stand test to 11 seconds from chair height with no UE support to demonstrate improvement in LE power and functional strength for daily activities.    Baseline  5 Time Sit to Stand: 27 seconds from chair height no UE support  (11/17/2018);     Time  12    Period  Weeks    Status  New    Target Date  02/09/19      PT LONG TERM GOAL #4   Title  Patient will ambulate faster than 1.27m/s on the 10 Meter Walk Test to improve ability to cross street safey for community participation.     Baseline  testing deferred to next session (11/17/2018);     Time  12    Period  Weeks    Status  New    Target Date  02/09/19      PT LONG TERM GOAL #5   Title  Patient will score equal or greater than  25/30 on the Functional Gait Assessment to demonstrate low fall risk to improve her ability to participate safely in community activities.     Baseline  testing deferred to next session (11/17/2018);     Time  12    Period  Weeks    Status  New    Target Date  02/09/19        Plan - 11/17/18 1920    Clinical Impression Statement  Patient is a 73 y.o. female referred to outpatient physical therapy with a medical diagnosis of degeneration of lumbar intervertebral disc who presents with signs and symptoms consistent with low back pain with possible referral to bilateral LE, leg pain related to peripheral vascular disease with intermittent vascular claudication, generalized deconditioning/weakness and poor activity tolerance, and abnormal posture. Patient presents with significant pain, activity tolerance, ROM, cardiovascular, postural, weakness, and endurance impairments that are limiting ability to complete basic ADLs, IADLs, and valued community and social activities including dog showing, transferring, ambulating household and community distances, squatting,  stooping, dog handling/training, and stairs without difficulty. Patient will benefit from skilled physical therapy intervention to address current body structure impairments and activity limitations to improve function and work towards goals set in current POC in order to return to prior level of function or maximal functional improvement.    Personal Factors and Comorbidities   Age;Comorbidity 3+;Fitness;Time since onset of injury/illness/exacerbation;Past/Current Experience;Other   significant anxiety affecting wellbeing and wellness behaviors   Comorbidities  underwent extensive vascular surgeries in Jan 2020 to improve blood flow, similar surgery for R leg scheduled 12/07/2018. Has a history of left carotid artery stenosis, but not enough to have surgery, has a history of multiple arterial surgeries, history of R cataract surgery, colon surgery, cholecystectomy, R foot skin graft, ovary surgery, gastric bypass, R foot fusion, abdominal hysterectomy; current smoker, plantar fasciitis, peripheral arterial disease, LVH, diabetes mellitus with neuropathy, irregular heartbeat, hiatal hernia, GERD, stenosis of left heart ventricle, depression, lumbar degenerative disc disease, cardiac murmur, asthma, anxiety, obesity, HTN, and seasonal allergies She has been advised not to lift over 10 pounds by physician.      Examination-Activity Limitations  Bathing;Carry;Lift;Sit;Stand;Locomotion Level;Toileting;Dressing;Squat;Transfers;Stairs;Hygiene/Grooming;Other   ADLs, IADLs, and valued community and social activities including dog showing, transferring, ambulating household and community distances, squatting, stooping, dog handling/training, and stairs   Examination-Participation Restrictions  Community Activity;Volunteer;Cleaning;Interpersonal Relationship;Yard Higher education careers adviser, participation in Teacher, adult education   Stability/Clinical Decision Making  Evolving/Moderate complexity    Clinical Decision Making  Moderate    Clinical Presentation due to:  condition is remaining chronic and contiues to require surgeries    Rehab Potential  Fair    Clinical Impairments Affecting Rehab Potential  (+) motivated (-) muliple co morbidities, chronic condition    PT Frequency  2x / week    PT Duration  12 weeks    PT Treatment/Interventions  Moist Heat;Patient/family education;Neuromuscular  re-education;Therapeutic exercise;Manual techniques;ADLs/Self Care Home Management;Electrical Stimulation;Gait training;Stair training;Functional mobility training;Therapeutic activities;Balance training;Passive range of motion;Dry needling;Energy conservation;Spinal Manipulations;Joint Manipulations;Cryotherapy   joint mobilizations grades I-IV   PT Next Visit Plan  further assess balance, add to HEP    PT Home Exercise Plan  Medbridge Access Code: [email or web address]    Recommended Other Services  consider smoking cessation program/assistance, mental health therapy for anxiety (some resources provided)    Consulted and Agree with Plan of Care  Patient       Patient will benefit from skilled therapeutic intervention in order to improve the following deficits and impairments:  Decreased strength, Impaired flexibility, Decreased activity tolerance, Impaired perceived functional ability, Pain, Decreased endurance, Difficulty walking, Abnormal gait, Decreased knowledge of use of DME, Decreased skin integrity, Decreased range of motion, Impaired sensation, Improper body mechanics, Obesity, Postural dysfunction, Increased edema, Decreased mobility, Decreased balance, Cardiopulmonary status limiting activity, Decreased coordination  Visit Diagnosis: Muscle weakness (generalized)  Chronic bilateral low back pain, unspecified whether sciatica present  Difficulty in walking, not elsewhere classified  History of falling     Problem List Patient Active Problem List   Diagnosis Date Noted  . Lower extremity edema 07/31/2018  . Pressure injury of skin 07/24/2018  . Surgical site infection 07/23/2018  . Wound of left leg 07/23/2018  . Atherosclerosis of artery of extremity with rest pain (HCC) 07/08/2018  . Shortness of breath 06/02/2018  . Bruit 06/02/2018  . Coronary artery disease of native artery of native heart with stable angina pectoris (HCC) 06/02/2018  . Atherosclerosis of native arteries of  extremity with intermittent claudication (HCC) 05/19/2018  .  Peripheral arterial disease (HCC) 05/06/2018  . Personal history of colonic polyps   . Aortic stenosis 12/24/2017  . LVH (left ventricular hypertrophy) 12/24/2017  . Hypertension with heart disease 12/24/2017  . Left atrial dilatation 12/12/2017  . Cardiac murmur 12/12/2017  . Gastrojejunal ulcer 12/04/2017  . Hyperphosphatemia 12/04/2017  . Spinal stenosis of lumbar region 06/18/2017  . Degeneration of lumbar intervertebral disc 06/02/2017  . Assistance needed with transportation 05/26/2017  . Financial difficulties 05/26/2017  . Needs assistance with community resources 05/26/2017  . Moderate recurrent major depression (HCC) 05/26/2017  . Non-healing wound of lower extremity 02/06/2017  . Status post ankle arthrodesis 12/11/2016  . Controlled substance agreement broken 10/11/2016  . Low back pain 09/25/2016  . Osteoarthritis of right subtalar joint 07/11/2016  . Posterior tibial tendinitis of right lower extremity 07/11/2016  . Breast cancer screening 06/18/2016  . Knee pain 04/03/2016  . Hyponatremia 03/01/2016  . High triglycerides 12/24/2015  . Pes planus of both feet 11/16/2015  . Plantar fasciitis of right foot 11/16/2015  . Heel spur 11/16/2015  . Diabetic neuropathy (HCC) 11/16/2015  . Right ankle pain 11/16/2015  . Medication monitoring encounter 11/16/2015  . Chronic pain of multiple joints 08/15/2015  . Abnormal mammogram of right breast 06/19/2015  . Cerumen impaction 03/16/2015  . Hyperlipidemia 12/14/2014  . History of transfusion of packed RBC 12/14/2014  . Hx of smoking 12/14/2014  . Status post bariatric surgery 12/14/2014  . Morbid obesity (HCC) 12/14/2014  . Chronic radicular lumbar pain 12/14/2014  . History of GI bleed 11/12/2014  . GERD without esophagitis 11/12/2014  . History of small bowel obstruction 09/07/2014    Cira Rue 11/17/2018, 7:39 PM  [facility]  [facility] [address], [place], [address] Phone: [phone]   Fax:  [phone]  Name: Andrea Holden MRN: [id number] Date of Birth: 07/21/1945

## 2018-11-19 ENCOUNTER — Telehealth: Payer: Self-pay

## 2018-11-19 ENCOUNTER — Encounter: Payer: Self-pay | Admitting: Physical Therapy

## 2018-11-19 ENCOUNTER — Ambulatory Visit: Payer: Medicare Other | Admitting: Physical Therapy

## 2018-11-19 ENCOUNTER — Ambulatory Visit: Payer: Self-pay | Admitting: *Deleted

## 2018-11-19 ENCOUNTER — Other Ambulatory Visit: Payer: Self-pay

## 2018-11-19 VITALS — BP 162/61 | HR 75

## 2018-11-19 DIAGNOSIS — Z9181 History of falling: Secondary | ICD-10-CM

## 2018-11-19 DIAGNOSIS — Z599 Problem related to housing and economic circumstances, unspecified: Secondary | ICD-10-CM

## 2018-11-19 DIAGNOSIS — M6281 Muscle weakness (generalized): Secondary | ICD-10-CM | POA: Diagnosis not present

## 2018-11-19 DIAGNOSIS — G8929 Other chronic pain: Secondary | ICD-10-CM | POA: Diagnosis not present

## 2018-11-19 DIAGNOSIS — Z789 Other specified health status: Secondary | ICD-10-CM

## 2018-11-19 DIAGNOSIS — R262 Difficulty in walking, not elsewhere classified: Secondary | ICD-10-CM | POA: Diagnosis not present

## 2018-11-19 DIAGNOSIS — Z598 Other problems related to housing and economic circumstances: Secondary | ICD-10-CM

## 2018-11-19 DIAGNOSIS — M545 Low back pain: Secondary | ICD-10-CM | POA: Diagnosis not present

## 2018-11-19 NOTE — Patient Instructions (Signed)
Thank you allowing the Chronic Care Management Team to be a part of your care! It was a pleasure speaking with you today!  1. Please feel free to call this social worker with any questions or concerns related to your community resource needs.  CCM (Chronic Care Management) Team   Yvone Neu RN, BSN Nurse Care Coordinator  [phone]  Karalee Height PharmD  Clinical Pharmacist  [phone]   Verna Czech, LCSW Clinical Social Worker [phone]  Goals Addressed            This Visit's Progress   . I need help applying for food stamps (pt-stated)       Current Barriers:  . Financial constraints . Limited social support . Family and relationship dysfunction . Social Isolation  Clinical Social Work Clinical Goal(s):  Marland Kitchen Over the next 30 days, client will work with SW to address concerns related to applying for food stamps  Interventions: . Patient interviewed and appropriate assessments performed . Confirmed with patient with that she has received the food stamp application. The application was signed and mailed to the Department of Social Services. . Patient's current mood evaluated, present coping skills explored . Discussed plans with patient for ongoing care management follow up and provided patient with direct contact information for care management team   Patient Self Care Activities:  . Self administers medications as prescribed . Attends all scheduled provider appointments . Performs ADL's independently . Performs IADL's independently  Please see past updates related to this goal by clicking on the "Past Updates" button in the selected goal          The patient verbalized understanding of instructions provided today and declined a print copy of patient instruction materials.   Telephone follow up appointment with CCM team member scheduled for:11/30/2018 at 11am

## 2018-11-19 NOTE — Chronic Care Management (AMB) (Signed)
  Chronic Care Management    Clinical Social Work Follow Up Note  11/19/2018 Name: Andrea Holden MRN: [id number] DOB: 03-Apr-1946  Andrea Holden is a 73 y.o. year old female who is a primary care patient of Lada, Janit Bern, MD. The CCM team was consulted for assistance with Food Insecurity.   Patient requested assistance with completing a food stamp application.  Completed food stamp application mailed to patient and it was confirmed today that patient has received the application and has signed and returned it to the Department of Social Services.  Social Determinants of Health screening completed revealing that patient is high risk due to tobacco use, financial strain and physical activity. Per patient, [facility]. Patient further states that she has addressed financial strain by making payment arrangements with her creditors and applying for food stamps.  Review of patient status, including review of consultants reports, other relevant assessments, and collaboration with appropriate care team members and the patient's provider was performed as part of comprehensive patient evaluation and provision of chronic care management services.      Goals Addressed            This Visit's Progress   . I need help applying for food stamps (pt-stated)       Current Barriers:  . Financial constraints . Limited social support . Family and relationship dysfunction . Social Isolation  Clinical Social Work Clinical Goal(s):  Marland Kitchen Over the next 30 days, client will work with SW to address concerns related to applying for food stamps  Interventions: . Patient interviewed and appropriate assessments performed . Confirmed with patient with that she has received the food stamp application. The application was signed and mailed to the Department of Social Services. . Patient's current mood evaluated, present coping skills explored . Discussed plans  with patient for ongoing care management follow up and provided patient with direct contact information for care management team   Patient Self Care Activities:  . Self administers medications as prescribed . Attends all scheduled provider appointments . Performs ADL's independently . Performs IADL's independently  Please see past updates related to this goal by clicking on the "Past Updates" button in the selected goal          Follow Up Plan: Appointment scheduled for SW follow up with client by phone on:  11/30/2018.   Verna Czech, LCSW Clinical Social Ecologist [facility] Care Management [phone]

## 2018-11-19 NOTE — Therapy (Signed)
[facility] [address] [place], [address] Phone: [phone]   Fax:  [phone]  Physical Therapy Treatment  Patient Details  Name: Andrea Holden MRN: [id number] Date of Birth: 1946/06/11 Referring Provider (PT): Cheryle Horsfall, NP    Encounter Date: 11/19/2018  PT End of Session - 11/19/18 1445    Visit Number  2    Number of Visits  24    Date for PT Re-Evaluation  02/09/19    Authorization Type  [facility] Medicare reporting period from 01/12/2019    Authorization Time Period  Current cert period 01/04/8674 - 02/03/19    Authorization - Visit Number  2    Authorization - Number of Visits  10    PT Start Time  1440   patient 10 min late   PT Stop Time  1515    PT Time Calculation (min)  35 min    Equipment Utilized During Treatment  Gait belt    Activity Tolerance  Patient tolerated treatment well;Patient limited by fatigue;Patient limited by pain    Behavior During Therapy  WFL for tasks assessed/performed       Past Medical History:  Diagnosis Date  . Allergy    seasonal allergies  . Anemia 2014   needed 5 units of blood d/t passing out, weak  . Anxiety   . Aortic stenosis 12/24/2017   Echo Aug 2018  . Arthritis   . Asthma    allergy induced asthma  . Cardiac murmur 12/12/2017  . Cataract   . Cataract    left  . Complication of anesthesia    arrhythmia following colonoscopy  . Degenerative disc disease, lumbar   . Degenerative disc disease, lumbar   . Depression   . Diabetes mellitus   . Diabetic neuropathy (HCC) 11/16/2015  . Dysrhythmia    stenosis of left ventricle  . GERD (gastroesophageal reflux disease)   . H/O transfusion    patient was given 5 units of blood while at [facility], blood type O+  . History of chicken pox   . History of hiatal hernia   . History of measles, mumps, or rubella   . HOH (hard of hearing)    does not use hearing aides yet  . Hyperlipidemia   .  Hypertension   . Irregular heartbeat   . LVH (left ventricular hypertrophy) 12/24/2017   Echo Aug 2018  . Neuropathy   . Opiate use 11/16/2015  . Peripheral arterial disease (HCC) 05/06/2018   At rest, left > right; refer to vasc  . Pes planus of both feet 11/16/2015  . Plantar fasciitis of right foot 11/16/2015  . Wheezing     Past Surgical History:  Procedure Laterality Date  . ABDOMINAL HYSTERECTOMY    . APPLICATION OF WOUND VAC Left 07/23/2018   Procedure: APPLICATION OF WOUND VAC;  Surgeon: Annice Needy, MD;  Location: [facility] OR[facility];  Service: Vascular;  Laterality: Left;  . banck injections    . CATARACT EXTRACTION W/PHACO Right 05/30/2015   Procedure: CATARACT EXTRACTION PHACO AND INTRAOCULAR LENS PLACEMENT (IOC);  Surgeon: Galen Manila, MD;  Location: [facility];  Service: Ophthalmology;  Laterality: Right;  [place] 00:57 AP% 20.9 CDE 11.99 fluid pack lot #[id number]  . CHOLECYSTECTOMY  1970  . COLON SURGERY  2013   blocked colon  . COLONOSCOPY WITH PROPOFOL N/A 01/20/2018   Procedure: COLONOSCOPY WITH PROPOFOL;  Surgeon: Midge Minium, MD;  Location: [facility];  Service: Endoscopy;  Laterality: N/A;  . ENDARTERECTOMY FEMORAL Left 07/08/2018   Procedure: ENDARTERECTOMY FEMORAL;  Surgeon: Annice Needy, MD;  Location: [facility];  Service: Vascular;  Laterality: Left;  . EYE SURGERY Right    cataract surgery  . FOOT FUSION Right 2018   metal in foot  . GALLBLADDER SURGERY  1970  . GASTRIC BYPASS  2010   lost 178 lbs and regained 40 lbs last few years  . HUMERUS FRACTURE SURGERY Right    metal plate with screws  . internal bleeding  2016   ulcer in past  . LOWER EXTREMITY ANGIOGRAM Left 07/08/2018   Procedure: LOWER EXTREMITY ANGIOGRAM;  Surgeon: Annice Needy, MD;  Location: [facility];  Service: Vascular;  Laterality: Left;  . LOWER EXTREMITY ANGIOGRAPHY Left 05/27/2018   Procedure: LOWER EXTREMITY ANGIOGRAPHY;  Surgeon: Annice Needy, MD;  Location: [facility];  Service:  Cardiovascular;  Laterality: Left;  . MOUTH SURGERY     root canals and crowns and extractions  . OVARY SURGERY    . SKIN GRAFT Right 2018   RT foot. foot has been rebuilt.  it is full of metal  . TENOTOMY ACHILLES TENDON Right    Percuntaneous. metal in foot  . TONSILLECTOMY    . WOUND DEBRIDEMENT Left 07/23/2018   Procedure: DEBRIDEMENT WOUND;  Surgeon: Annice Needy, MD;  Location: [facility];  Service: Vascular;  Laterality: Left;    Vitals:   11/19/18 1443  BP: (!) 162/61  Pulse: 75  SpO2: 98%    Subjective Assessment - 11/19/18 1443    Subjective  Patient reports pain in the low back and bilateral glutes of 5/10 her usual pain. She has had no falls since last treatment session and no excessive pain or soreness. States she actually feels better when she moves more.     Pertinent History  Patient is a 73 y.o. female who presents to outpatient physical therapy with a referral for medical diagnosis of degeneration of lumbar intervertebral disc. This patient's chief complaints consist of chronic low back pain and foot pain, weakness, stiffness, leading to the following functional deficits: difficulty with with basic ADLs, IADLs, ambulation. She used to get injections to help control back pain but it has recently worsened due to being unable to get injections during COVID19 pandemic. The patient has been informed of current processes in place at Outpatient Rehab to protect patients from Covid-19 exposure including social distancing, schedule modifications, and new cleaning procedures. After discussing their particular risk with a therapist based on the patient's personal risk factors, the patient has decided to proceed with in-person therapy. Pt is scheduled for R LE angiography on 12/07/2018. She has recently started smoking again with the stress of COVID19 pandemic and feels strongly she needs to stop. Surgeon has stated in the past he will not complete surgery if she is smoking.  Relevant past  medical history and comorbidities include underwent extensive vascular surgeries in Jan 2020 to improve blood flow, similar surgery for R leg scheduled 12/07/2018. Has a history of left carotid artery stenosis, but not enough to have surgery, has a history of multiple arterial surgeries, history of R cataract surgery, colon surgery, cholecystectomy, R foot skin graft, ovary surgery, gastric bypass, R foot fusion, abdominal hysterectomy; current smoker, plantar fasciitis, peripheral arterial disease, LVH, diabetes mellitus with neuropathy, irregular heartbeat, hiatal hernia, GERD, stenosis of left heart ventricle, depression, lumbar degenerative disc disease, cardiac murmur, asthma, anxiety, obesity, HTN, and seasonal  allergies She has been advised not to lift over 10 pounds by physician    Limitations  Sitting;Standing;Walking;House hold activities;Lifting    How long can you sit comfortably?  <1 hour    How long can you stand comfortably?  1 minute    How long can you walk comfortably?  2 minutes, depends on what she has with her. Able to walk at fair with rollator. To mailbox 120 feet causes shaking in R leg.      Diagnostic tests  MRI lumbar spine report 06/14/2017: "IMPRESSION: 1. Stable degenerative of lumbar spondylosis and scoliosis with multilevel disc disease and facet disease. 2. Stable right foraminal stenosis at L5-S1 due to right-sided disc    Patient Stated Goals  patient would like to get back to PLOF, walking further, be better able to participate in dog showing and trips    Currently in Pain?  Yes    Pain Score  5     Pain Location  Back    Pain Orientation  Posterior    Pain Descriptors / Indicators  Aching    Pain Radiating Towards  bilateral glutes    Pain Onset  More than a month ago         TREATMENT:    Therapeutic exercise:to centralize symptoms and improve ROM, strength, muscular endurance, and activity tolerance required for successful completion of functional  activities.  - NuStep level 1 using bilateral upper and lower extremities. Seat/handle setting 8. For improved extremity mobility, muscular endurance, and activity tolerance; and to induce the analgesic effect of aerobic exercise, stimulate improved joint nutrition, and prepare body structures and systems for following interventions. x 5  minutes.  - Sit <> stand from low plinth without UE support x 7, x10, x5, x7, x1 to improve functional strength and power for transfers and general mobility.  - standing heel raises on 20 degree slant board with BUE support to improve ankle strength to help prevent falls through stronger ankle and step strategy. X 10 (to fatigue).  - standing hip abduction with BUE support, x10 each side. Red theraband around distal thighs. Also x10 with 5# ankle weight on left leg to left side. Attempted to R side but unable to prevent collapse into L hip due to profoundly weak hip abductors and extensors.  - standing hip hikes with BUE x 10 each side with intensive cuing. Limited ability to complete with L hip abductors working. Educated on importance of strengthening this area for normal gait and balance.  - Education on HEP including handout   HOME EXERCISE PROGRAM Access Code: [email or web address]  URL: [email or web address]  Date: 11/19/2018  Prepared by: Norton BlizzardSara Lavonta Tillis   Exercises  Sit to Stand without Arm Support - 30 reps - 1x daily  Standing Hip Hiking - 3 sets - 10 reps - 1x daily     PT Education - 11/19/18 1445    Education provided  Yes    Education Details  Exercise purpose/form. Self management techniques. Education on diagnosis, prognosis, POC, anatomy and physiology of current condition    Person(s) Educated  Patient    Methods  Explanation;Demonstration;Tactile cues;Verbal cues;Handout    Comprehension  Verbalized understanding;Returned demonstration;Verbal cues required;Tactile cues required       PT Short Term Goals - 11/17/18 1925      PT SHORT  TERM GOAL #1   Title  Be independent with initial home exercise program for self-management of symptoms.    Baseline  Initial HEP provided at IE (11/17/2018);  Time  2    Period  Weeks    Status  New    Target Date  12/01/18        PT Long Term Goals - 11/17/18 1926      PT LONG TERM GOAL #1   Title  Be independent with a long-term home exercise program for self-management of symptoms.     Baseline  Initial HEP provided at IE (11/17/2018);    Time  12    Period  Weeks    Status  New    Target Date  02/09/19      PT LONG TERM GOAL #2   Title  Patient will improve to equal or greater than 1000 feet with LRAD to demonstrate improved activity tolerance for community ambulation.     Baseline  6 Minute Walk Test: 200 feet with SPC and supervision. Stopped at 1:43 due to fatigue and leg pain. (11/17/2018);    Time  12    Period  Weeks    Status  New    Target Date  02/09/19      PT LONG TERM GOAL #3   Title  Patient will demonstrate improved 5 Times Sit to Stand test to 11 seconds from chair height with no UE support to demonstrate improvement in LE power and functional strength for daily activities.    Baseline  5 Time Sit to Stand: 27 seconds from chair height no UE support (11/17/2018);     Time  12    Period  Weeks    Status  New    Target Date  02/09/19      PT LONG TERM GOAL #4   Title  Patient will ambulate faster than 1.83m/s on the 10 Meter Walk Test to improve ability to cross street safey for community participation.     Baseline  testing deferred to next session (11/17/2018);     Time  12    Period  Weeks    Status  New    Target Date  02/09/19      PT LONG TERM GOAL #5   Title  Patient will score equal or greater than  25/30 on the Functional Gait Assessment to demonstrate low fall risk to improve her ability to participate safely in community activities.     Baseline  testing deferred to next session (11/17/2018);     Time  12    Period  Weeks    Status  New     Target Date  02/09/19            Plan - 11/19/18 1530    Clinical Impression Statement  Patient was 10 minutes late and apologetic. Unable to extend treatment time due to time restraints. Patient has a history of tardiness. Pt tolerated treatment well and is making appropriate progress towards goals at this point. She demonstrated inability to stabilize left lateral hip with SLS even with BUE support which explain her trendelenburg gait. Patient required extended rest breaks and fatigued very quickly but was able to complete overall more strengthening and functional activity compared to last session. Updated HEP to improve hip strengthening exercise with plan to re-visit next session. Pt was able to complete all exercises with minimal to no lasting increase in pain or discomfort by end of session. She had increased pain and fatigue that required  Pt required multimodal cuing for proper technique and to facilitate improved neuromuscular control, strength, range of motion, and functional ability resulting in improved performance  and form. Patient would benefit from continued physical therapy to address remaining impairments and functional limitations to work towards stated goals and return to PLOF or maximal functional independence    Personal Factors and Comorbidities  Age;Comorbidity 3+;Fitness;Time since onset of injury/illness/exacerbation;Past/Current Experience;Other   significant anxiety affecting wellbeing and wellness behaviors   Comorbidities  underwent extensive vascular surgeries in Jan 2020 to improve blood flow, similar surgery for R leg scheduled 12/07/2018. Has a history of left carotid artery stenosis, but not enough to have surgery, has a history of multiple arterial surgeries, history of R cataract surgery, colon surgery, cholecystectomy, R foot skin graft, ovary surgery, gastric bypass, R foot fusion, abdominal hysterectomy; current smoker, plantar fasciitis, peripheral arterial  disease, LVH, diabetes mellitus with neuropathy, irregular heartbeat, hiatal hernia, GERD, stenosis of left heart ventricle, depression, lumbar degenerative disc disease, cardiac murmur, asthma, anxiety, obesity, HTN, and seasonal allergies She has been advised not to lift over 10 pounds by physician.      Examination-Activity Limitations  Bathing;Carry;Lift;Sit;Stand;Locomotion Level;Toileting;Dressing;Squat;Transfers;Stairs;Hygiene/Grooming;Other   ADLs, IADLs, and valued community and social activities including dog showing, transferring, ambulating household and community distances, squatting, stooping, dog handling/training, and stairs   Examination-Participation Restrictions  Community Activity;Volunteer;Cleaning;Interpersonal Relationship;[employer] Higher education careers adviser, participation in Teacher, adult education   Stability/Clinical Decision Making  Evolving/Moderate complexity    Rehab Potential  Fair    Clinical Impairments Affecting Rehab Potential  (+) motivated (-) muliple co morbidities, chronic condition    PT Frequency  2x / week    PT Duration  12 weeks    PT Treatment/Interventions  Moist Heat;Patient/family education;Neuromuscular re-education;Therapeutic exercise;Manual techniques;ADLs/Self Care Home Management;Electrical Stimulation;Gait training;Stair training;Functional mobility training;Therapeutic activities;Balance training;Passive range of motion;Dry needling;Energy conservation;Spinal Manipulations;Joint Manipulations;Cryotherapy   joint mobilizations grades I-IV   PT Next Visit Plan  continue with progressive functional strengtheing    PT Home Exercise Plan  Medbridge Access Code: [email or web address]    Consulted and Agree with Plan of Care  Patient       Patient will benefit from skilled therapeutic intervention in order to improve the following deficits and impairments:  Decreased strength, Impaired flexibility, Decreased activity tolerance, Impaired perceived functional ability, Pain,  Decreased endurance, Difficulty walking, Abnormal gait, Decreased knowledge of use of DME, Decreased skin integrity, Decreased range of motion, Impaired sensation, Improper body mechanics, Obesity, Postural dysfunction, Increased edema, Decreased mobility, Decreased balance, Cardiopulmonary status limiting activity, Decreased coordination  Visit Diagnosis: Muscle weakness (generalized)  Chronic bilateral low back pain, unspecified whether sciatica present  Difficulty in walking, not elsewhere classified  History of falling     Problem List Patient Active Problem List   Diagnosis Date Noted  . Lower extremity edema 07/31/2018  . Pressure injury of skin 07/24/2018  . Surgical site infection 07/23/2018  . Wound of left leg 07/23/2018  . Atherosclerosis of artery of extremity with rest pain (HCC) 07/08/2018  . Shortness of breath 06/02/2018  . Bruit 06/02/2018  . Coronary artery disease of native artery of native heart with stable angina pectoris (HCC) 06/02/2018  . Atherosclerosis of native arteries of extremity with intermittent claudication (HCC) 05/19/2018  . Peripheral arterial disease (HCC) 05/06/2018  . Personal history of colonic polyps   . Aortic stenosis 12/24/2017  . LVH (left ventricular hypertrophy) 12/24/2017  . Hypertension with heart disease 12/24/2017  . Left atrial dilatation 12/12/2017  . Cardiac murmur 12/12/2017  . Gastrojejunal ulcer 12/04/2017  . Hyperphosphatemia 12/04/2017  . Spinal stenosis of lumbar region 06/18/2017  . Degeneration of lumbar  intervertebral disc 06/02/2017  . Assistance needed with transportation 05/26/2017  . Financial difficulties 05/26/2017  . Needs assistance with community resources 05/26/2017  . Moderate recurrent major depression (HCC) 05/26/2017  . Non-healing wound of lower extremity 02/06/2017  . Status post ankle arthrodesis 12/11/2016  . Controlled substance agreement broken 10/11/2016  . Low back pain 09/25/2016  .  Osteoarthritis of right subtalar joint 07/11/2016  . Posterior tibial tendinitis of right lower extremity 07/11/2016  . Breast cancer screening 06/18/2016  . Knee pain 04/03/2016  . Hyponatremia 03/01/2016  . High triglycerides 12/24/2015  . Pes planus of both feet 11/16/2015  . Plantar fasciitis of right foot 11/16/2015  . Heel spur 11/16/2015  . Diabetic neuropathy (HCC) 11/16/2015  . Right ankle pain 11/16/2015  . Medication monitoring encounter 11/16/2015  . Chronic pain of multiple joints 08/15/2015  . Abnormal mammogram of right breast 06/19/2015  . Cerumen impaction 03/16/2015  . Hyperlipidemia 12/14/2014  . History of transfusion of packed RBC 12/14/2014  . Hx of smoking 12/14/2014  . Status post bariatric surgery 12/14/2014  . Morbid obesity (HCC) 12/14/2014  . Chronic radicular lumbar pain 12/14/2014  . History of GI bleed 11/12/2014  . GERD without esophagitis 11/12/2014  . History of small bowel obstruction 09/07/2014    Luretha Murphy. Ilsa Iha, PT, DPT 11/19/18, 3:32 PM   [facility] AND SPORTS MEDICINE [address], [place], [address] Phone: [phone]   Fax:  [phone]  Name: WILMINA MAXHAM MRN: [id number] Date of Birth: January 05, 1946

## 2018-11-24 ENCOUNTER — Ambulatory Visit: Payer: Medicare Other | Admitting: Physical Therapy

## 2018-11-24 ENCOUNTER — Other Ambulatory Visit: Payer: Self-pay

## 2018-11-24 ENCOUNTER — Encounter: Payer: Self-pay | Admitting: Physical Therapy

## 2018-11-24 VITALS — BP 175/67 | HR 73

## 2018-11-24 DIAGNOSIS — G8929 Other chronic pain: Secondary | ICD-10-CM | POA: Diagnosis not present

## 2018-11-24 DIAGNOSIS — M545 Low back pain, unspecified: Secondary | ICD-10-CM

## 2018-11-24 DIAGNOSIS — Z9181 History of falling: Secondary | ICD-10-CM

## 2018-11-24 DIAGNOSIS — R262 Difficulty in walking, not elsewhere classified: Secondary | ICD-10-CM | POA: Diagnosis not present

## 2018-11-24 DIAGNOSIS — M6281 Muscle weakness (generalized): Secondary | ICD-10-CM | POA: Diagnosis not present

## 2018-11-24 NOTE — Therapy (Signed)
[facility] [address] [place], [address] Phone: [phone]   Fax:  [phone]  Physical Therapy Treatment  Patient Details  Name: Andrea Holden MRN: [id number] Date of Birth: 15-Dec-1945 Referring Provider (PT): Fredderick Severance, NP    Encounter Date: 11/24/2018  PT End of Session - 11/24/18 1429    Visit Number  3    Number of Visits  24    Date for PT Re-Evaluation  02/09/19    Authorization Type  [facility] Medicare reporting period from 01/12/2019    Authorization Time Period  Current cert period 2/59/5638 - 02/03/19    Authorization - Visit Number  3    Authorization - Number of Visits  10    PT Start Time  1330    PT Stop Time  1415    PT Time Calculation (min)  45 min    Equipment Utilized During Treatment  Gait belt    Activity Tolerance  Patient tolerated treatment well;Patient limited by fatigue;Patient limited by pain    Behavior During Therapy  WFL for tasks assessed/performed       Past Medical History:  Diagnosis Date  . Allergy    seasonal allergies  . Anemia 2014   needed 5 units of blood d/t passing out, weak  . Anxiety   . Aortic stenosis 12/24/2017   Echo Aug 2018  . Arthritis   . Asthma    allergy induced asthma  . Cardiac murmur 12/12/2017  . Cataract   . Cataract    left  . Complication of anesthesia    arrhythmia following colonoscopy  . Degenerative disc disease, lumbar   . Degenerative disc disease, lumbar   . Depression   . Diabetes mellitus   . Diabetic neuropathy ([place]) 11/16/2015  . Dysrhythmia    stenosis of left ventricle  . GERD (gastroesophageal reflux disease)   . H/O transfusion    patient was given 5 units of blood while at [facility], blood type O+  . History of chicken pox   . History of hiatal hernia   . History of measles, mumps, or rubella   . HOH (hard of hearing)    does not use hearing aides yet  . Hyperlipidemia   . Hypertension   .  Irregular heartbeat   . LVH (left ventricular hypertrophy) 12/24/2017   Echo Aug 2018  . Neuropathy   . Opiate use 11/16/2015  . Peripheral arterial disease ([place]) 05/06/2018   At rest, left > right; refer to vasc  . Pes planus of both feet 11/16/2015  . Plantar fasciitis of right foot 11/16/2015  . Wheezing     Past Surgical History:  Procedure Laterality Date  . ABDOMINAL HYSTERECTOMY    . APPLICATION OF WOUND VAC Left 07/23/2018   Procedure: APPLICATION OF WOUND VAC;  Surgeon: Algernon Huxley, MD;  Location: [facility] ORS;  Service: Vascular;  Laterality: Left;  . banck injections    . CATARACT EXTRACTION W/PHACO Right 05/30/2015   Procedure: CATARACT EXTRACTION PHACO AND INTRAOCULAR LENS PLACEMENT (IOC);  Surgeon: Birder Robson, MD;  Location: [facility] OR[facility];  Service: Ophthalmology;  Laterality: Right;  [place] 00:57 AP% 20.9 CDE 11.99 fluid pack lot #[id number]  . CHOLECYSTECTOMY  1970  . COLON SURGERY  2013   blocked colon  . COLONOSCOPY WITH PROPOFOL N/A 01/20/2018   Procedure: COLONOSCOPY WITH PROPOFOL;  Surgeon: Lucilla Lame, MD;  Location: [facility] ENDOS[facility];  Service: Endoscopy;  Laterality: N/A;  .  ENDARTERECTOMY FEMORAL Left 07/08/2018   Procedure: ENDARTERECTOMY FEMORAL;  Surgeon: Algernon Huxley, MD;  Location: [facility] ORS;  Service: Vascular;  Laterality: Left;  . EYE SURGERY Right    cataract surgery  . FOOT FUSION Right 2018   metal in foot  . GALLBLADDER SURGERY  1970  . GASTRIC BYPASS  2010   lost 178 lbs and regained 40 lbs last few years  . HUMERUS FRACTURE SURGERY Right    metal plate with screws  . internal bleeding  2016   ulcer in past  . LOWER EXTREMITY ANGIOGRAM Left 07/08/2018   Procedure: LOWER EXTREMITY ANGIOGRAM;  Surgeon: Algernon Huxley, MD;  Location: [facility] ORS;  Service: Vascular;  Laterality: Left;  . LOWER EXTREMITY ANGIOGRAPHY Left 05/27/2018   Procedure: LOWER EXTREMITY ANGIOGRAPHY;  Surgeon: Algernon Huxley, MD;  Location: [facility];  Service: Cardiovascular;   Laterality: Left;  . MOUTH SURGERY     root canals and crowns and extractions  . OVARY SURGERY    . SKIN GRAFT Right 2018   RT foot. foot has been rebuilt.  it is full of metal  . TENOTOMY ACHILLES TENDON Right    Percuntaneous. metal in foot  . TONSILLECTOMY    . WOUND DEBRIDEMENT Left 07/23/2018   Procedure: DEBRIDEMENT WOUND;  Surgeon: Algernon Huxley, MD;  Location: [facility] OR[facility];  Service: Vascular;  Laterality: Left;    Vitals:   11/24/18 1334  BP: (!) 175/67  Pulse: 73  SpO2: 97%    Subjective Assessment - 11/24/18 1334    Subjective  Patient reports she was sore following last treatment session and had difficulty performing her HEP the following day but she expects this. She reports she overdid it a lot two days ago taking her dogs to get groomed, but it was a very satisfying to get that done. She has not had a cigarrete since last night. She has 6/10 pain in the R and L gutes and low back.      Pertinent History  Patient is a 73 y.o. female who presents to outpatient physical therapy with a referral for medical diagnosis of degeneration of lumbar intervertebral disc. This patient's chief complaints consist of chronic low back pain and foot pain, weakness, stiffness, leading to the following functional deficits: difficulty with with basic ADLs, IADLs, ambulation. She used to get injections to help control back pain but it has recently worsened due to being unable to get injections during [place] pandemic. The patient has been informed of current processes in place at Outpatient Rehab to protect patients from Covid-19 exposure including social distancing, schedule modifications, and new cleaning procedures. After discussing their particular risk with a therapist based on the patient's personal risk factors, the patient has decided to proceed with in-person therapy. Pt is scheduled for R LE angiography on 12/07/2018. She has recently started smoking again with the stress of COVID19 pandemic and  feels strongly she needs to stop. Surgeon has stated in the past he will not complete surgery if she is smoking.  Relevant past medical history and comorbidities include underwent extensive vascular surgeries in Jan 2020 to improve blood flow, similar surgery for R leg scheduled 12/07/2018. Has a history of left carotid artery stenosis, but not enough to have surgery, has a history of multiple arterial surgeries, history of R cataract surgery, colon surgery, cholecystectomy, R foot skin graft, ovary surgery, gastric bypass, R foot fusion, abdominal hysterectomy; current smoker, plantar fasciitis, peripheral arterial disease, LVH, diabetes mellitus  with neuropathy, irregular heartbeat, hiatal hernia, GERD, stenosis of left heart ventricle, depression, lumbar degenerative disc disease, cardiac murmur, asthma, anxiety, obesity, HTN, and seasonal allergies She has been advised not to lift over 10 pounds by physician    Limitations  Sitting;Standing;Walking;House hold activities;Lifting    How long can you sit comfortably?  <1 hour    How long can you stand comfortably?  1 minute    How long can you walk comfortably?  2 minutes, depends on what she has with her. Able to walk at fair with rollator. To mailbox 120 feet causes shaking in R leg.      Diagnostic tests  MRI lumbar spine report 06/14/2017: "IMPRESSION: 1. Stable degenerative of lumbar spondylosis and scoliosis with multilevel disc disease and facet disease. 2. Stable right foraminal stenosis at L5-S1 due to right-sided disc    Patient Stated Goals  patient would like to get back to PLOF, walking further, be better able to participate in dog showing and trips    Currently in Pain?  Yes    Pain Score  6     Pain Location  Back    Pain Orientation  Left;Right    Pain Descriptors / Indicators  Aching    Pain Type  Chronic pain    Pain Onset  More than a month ago        [facility] PT Assessment - 11/24/18 0001      Assessment   Medical Diagnosis   Degeneration of lumbar intervertebral disc    Referring Provider (PT)  Poulose, Bethel Born, NP     Hand Dominance  Right    Prior Therapy  participated several times with improved function.       Precautions   Precautions  Other (comment)   no lifting over 10#     Restrictions   Weight Bearing Restrictions  No      Home Environment   Living Environment  Private residence    [place]   with two dogs (Shit tzu and Magazine features editor).    Type of [place] to enter    Entrance Stairs-Number of Steps  3    Entrance Stairs-Rails  Right;Left;Can reach both    Home Layout  One level    Home Equipment  Tub bench;Bedside commode;Other (comment);Cane - single point;Walker - 2 wheels;Grab bars - tub/shower;Grab bars - toilet   rollator     Prior Function   Level of Independence  Independent    Vocation  Part time employment   currently closed due to [place] 19 pandemic   Vocation Requirements  substitute teaching    Leisure  life revolves around dog showing (comfirmation and obedience), currently unable to do       Cognition   Overall Cognitive Status  Within Functional Limits for tasks assessed      Observation/Other Assessments   Observations  see note from 11/17/2018 for latest objective measures    Focus on Therapeutic Outcomes (FOTO)   42      Functional Gait  Assessment   Gait Level Surface  Walks 20 ft, slow speed, abnormal gait pattern, evidence for imbalance or deviates 10-15 in outside of the 12 in walkway width. Requires more than 7 sec to ambulate 20 ft.    Change in Gait Speed  Able to change speed, demonstrates mild gait deviations, deviates 6-10 in outside of the 12 in walkway width, or no gait deviations,  unable to achieve a major change in velocity, or uses a change in velocity, or uses an assistive device.    Gait with Horizontal Head Turns  Performs head turns smoothly with slight change in gait velocity (eg, minor disruption to  smooth gait path), deviates 6-10 in outside 12 in walkway width, or uses an assistive device.    Gait with Vertical Head Turns  Performs task with slight change in gait velocity (eg, minor disruption to smooth gait path), deviates 6 - 10 in outside 12 in walkway width or uses assistive device    Gait and Pivot Turn  Pivot turns safely in greater than 3 sec and stops with no loss of balance, or pivot turns safely within 3 sec and stops with mild imbalance, requires small steps to catch balance.    Step Over Obstacle  Cannot perform without assistance.    Gait with Narrow Base of Support  Ambulates less than 4 steps heel to toe or cannot perform without assistance.    Gait with Eyes Closed  Cannot walk 20 ft without assistance, severe gait deviations or imbalance, deviates greater than 15 in outside 12 in walkway width or will not attempt task.    Ambulating Backwards  Walks 20 ft, slow speed, abnormal gait pattern, evidence for imbalance, deviates 10-15 in outside 12 in walkway width.    Steps  Two feet to a stair, must use rail.    Total Score  11    FGA comment:  < 19 = high risk fall         TREATMENT:   Therapeutic exercise:to centralize symptoms and improve ROM, strength, muscular endurance, and activity tolerance required for successful completion of functional activities. - blood pressure measurement  - NuStep level 1 using bilateral upper and lower extremities. Seat setting 8/handle setting 7. For improved extremity mobility, muscular endurance, and activity tolerance; and to induce the analgesic effect of aerobic exercise, stimulate improved joint nutrition, and prepare body structures and systems for following interventions. x 5  minutes with SPM above 60. - 10 meter walk test for speed. Average of 3 max speed ambulations with SPC and supervision over 10 meters. To improve gait speed for improved ability to safely cross street. Average of 3 trials: 1.37 m/second - Functional Gait  Assessment: multiple walking tasks with cuing and Supervision - CGA for safety to improve balance and standing tolerance. Score: 11/30 - sidelying clam shell with hips in neutral flexion 3x10 each side. To improve single leg stance pelvis stability for ambulation - sidelying hip abduction x 5 each side. Required physical assistance and cuing to get in true sidelying. To improve single leg stance pelvis stability for ambulation.  - supine bridges 3x10, difficulty getting full ROM. To strengthen hip extension. Cuing for proper form.  - Education on HEP including handout   HOME EXERCISE PROGRAM Access Code: [email or web address]  URL: [email or web address]  Date: 11/24/2018  Prepared by: Rosita Kea   Exercises  Sit to Stand without Arm Support - 30 reps - 1x daily  Standing Hip Hiking - 3 sets - 10 reps - 1x daily  Sidelying Clamshell in Neutral - 3 sets - 10 reps - 2x daily  Supine Bridge - 3 sets - 10 reps - 1 second hold - 2x daily      PT Education - 11/24/18 1428    Education provided  Yes    Education Details  Exercise purpose/form. Self management techniques. Education on diagnosis, prognosis, POC, anatomy and physiology  of current condition Education on HEP including handout       PT Short Term Goals - 11/24/18 1426      PT SHORT TERM GOAL #1   Title  Be independent with initial home exercise program for self-management of symptoms.    Baseline  Initial HEP provided at IE (11/17/2018);    Time  2    Period  Weeks    Status  Partially Met    Target Date  12/01/18        PT Long Term Goals - 11/24/18 1426      PT LONG TERM GOAL #1   Title  Be independent with a long-term home exercise program for self-management of symptoms.     Baseline  Initial HEP provided at IE (11/17/2018);    Time  12    Period  Weeks    Status  New    Target Date  02/09/19      PT LONG TERM GOAL #2   Title  Patient will improve 6MWT to equal or greater than 1000 feet with LRAD to  demonstrate improved activity tolerance for community ambulation.     Baseline  6 Minute Walk Test: 200 feet with SPC and supervision. Stopped at 1:43 due to fatigue and leg pain. (11/17/2018);    Time  12    Period  Weeks    Status  New    Target Date  02/09/19      PT LONG TERM GOAL #3   Title  Patient will demonstrate improved 5 Times Sit to Stand test to 11 seconds from chair height with no UE support to demonstrate improvement in LE power and functional strength for daily activities.    Baseline  5 Time Sit to Stand: 27 seconds from chair height no UE support (11/17/2018);     Time  12    Period  Weeks    Status  New    Target Date  02/09/19      PT LONG TERM GOAL #4   Title  Patient will ambulate faster than 1.47ms on the 10 Meter Walk Test to improve ability to cross street safey for community participation.     Baseline  testing deferred to next session (11/17/2018); 1.37 m/sec with SPC (11/24/2018);     Time  12    Period  Weeks    Status  Achieved    Target Date  02/09/19      PT LONG TERM GOAL #5   Title  Patient will score equal or greater than  25/30 on the Functional Gait Assessment to demonstrate low fall risk to improve her ability to participate safely in community activities.     Baseline  testing deferred to next session (11/17/2018); 11/30 high fall risk (11/24/2018);    Time  12    Period  Weeks    Status  On-going    Target Date  02/09/19            Plan - 11/24/18 1431    Clinical Impression Statement  Pt tolerated treatment well and is making appropriate progress towards goals at this point. She demonstrated high fall risk on Functional Gait Assessment and met her goal for walking speed during 10 meter walk test. Exercises were trialed and added to her hep for further hip strengthening. She had difficulty achieving proper position and continues to demonstrate weakness in hip extension and abduction and quick fatigue with standing exercises, but was able to  perform mat  exercises with good ROM. She required multiple seated rest breaks today. Pt required multimodal cuing for proper technique and to facilitate improved neuromuscular control, strength, range of motion, and functional ability resulting in improved performance and form. Patient would benefit from continued physical therapy to address remaining impairments and functional limitations to work towards stated goals and return to PLOF or maximal functional independence    Personal Factors and Comorbidities  Age;Comorbidity 3+;Fitness;Time since onset of injury/illness/exacerbation;Past/Current Experience;Other   significant anxiety affecting wellbeing and wellness behaviors   Comorbidities  underwent extensive vascular surgeries in Jan 2020 to improve blood flow, similar surgery for R leg scheduled 12/07/2018. Has a history of left carotid artery stenosis, but not enough to have surgery, has a history of multiple arterial surgeries, history of R cataract surgery, colon surgery, cholecystectomy, R foot skin graft, ovary surgery, gastric bypass, R foot fusion, abdominal hysterectomy; current smoker, plantar fasciitis, peripheral arterial disease, LVH, diabetes mellitus with neuropathy, irregular heartbeat, hiatal hernia, GERD, stenosis of left heart ventricle, depression, lumbar degenerative disc disease, cardiac murmur, asthma, anxiety, obesity, HTN, and seasonal allergies She has been advised not to lift over 10 pounds by physician.      Examination-Activity Limitations  Bathing;Carry;Lift;Sit;Stand;Locomotion Level;Toileting;Dressing;Squat;Transfers;Stairs;Hygiene/Grooming;Other   ADLs, IADLs, and valued community and social activities including dog showing, transferring, ambulating household and community distances, squatting, stooping, dog handling/training, and stairs   Examination-Participation Restrictions  Community Activity;Volunteer;Cleaning;Interpersonal Relationship;Yard Social research officer, government,  participation in Chartered certified accountant   Stability/Clinical Decision Making  Evolving/Moderate complexity    Rehab Potential  Fair    Clinical Impairments Affecting Rehab Potential  (+) motivated (-) muliple co morbidities, chronic condition    PT Frequency  2x / week    PT Duration  12 weeks    PT Treatment/Interventions  Moist Heat;Patient/family education;Neuromuscular re-education;Therapeutic exercise;Manual techniques;ADLs/Self Care Home Management;Electrical Stimulation;Gait training;Stair training;Functional mobility training;Therapeutic activities;Balance training;Passive range of motion;Dry needling;Energy conservation;Spinal Manipulations;Joint Manipulations;Cryotherapy   joint mobilizations grades I-IV   PT Next Visit Plan  continue with progressive functional strengtheing    PT Home Exercise Plan  Medbridge Access Code: [email or web address]    Consulted and Agree with Plan of Care  Patient       Patient will benefit from skilled therapeutic intervention in order to improve the following deficits and impairments:  Decreased strength, Impaired flexibility, Decreased activity tolerance, Impaired perceived functional ability, Pain, Decreased endurance, Difficulty walking, Abnormal gait, Decreased knowledge of use of DME, Decreased skin integrity, Decreased range of motion, Impaired sensation, Improper body mechanics, Obesity, Postural dysfunction, Increased edema, Decreased mobility, Decreased balance, Cardiopulmonary status limiting activity, Decreased coordination  Visit Diagnosis: Muscle weakness (generalized)  Chronic bilateral low back pain, unspecified whether sciatica present  Difficulty in walking, not elsewhere classified  History of falling     Problem List Patient Active Problem List   Diagnosis Date Noted  . Lower extremity edema 07/31/2018  . Pressure injury of skin 07/24/2018  . Surgical site infection 07/23/2018  . Wound of left leg 07/23/2018  . Atherosclerosis of artery  of extremity with rest pain ([place]) 07/08/2018  . Shortness of breath 06/02/2018  . Bruit 06/02/2018  . Coronary artery disease of native artery of native heart with stable angina pectoris ([place]) 06/02/2018  . Atherosclerosis of native arteries of extremity with intermittent claudication ([place]) 05/19/2018  . Peripheral arterial disease ([place]) 05/06/2018  . Personal history of colonic polyps   . Aortic stenosis 12/24/2017  . LVH (left ventricular hypertrophy) 12/24/2017  . Hypertension with heart  disease 12/24/2017  . Left atrial dilatation 12/12/2017  . Cardiac murmur 12/12/2017  . Gastrojejunal ulcer 12/04/2017  . Hyperphosphatemia 12/04/2017  . Spinal stenosis of lumbar region 06/18/2017  . Degeneration of lumbar intervertebral disc 06/02/2017  . Assistance needed with transportation 05/26/2017  . Financial difficulties 05/26/2017  . Needs assistance with community resources 05/26/2017  . Moderate recurrent major depression ([place]) 05/26/2017  . Non-healing wound of lower extremity 02/06/2017  . Status post ankle arthrodesis 12/11/2016  . Controlled substance agreement broken 10/11/2016  . Low back pain 09/25/2016  . Osteoarthritis of right subtalar joint 07/11/2016  . Posterior tibial tendinitis of right lower extremity 07/11/2016  . Breast cancer screening 06/18/2016  . Knee pain 04/03/2016  . Hyponatremia 03/01/2016  . High triglycerides 12/24/2015  . Pes planus of both feet 11/16/2015  . Plantar fasciitis of right foot 11/16/2015  . Heel spur 11/16/2015  . Diabetic neuropathy ([place]) 11/16/2015  . Right ankle pain 11/16/2015  . Medication monitoring encounter 11/16/2015  . Chronic pain of multiple joints 08/15/2015  . Abnormal mammogram of right breast 06/19/2015  . Cerumen impaction 03/16/2015  . Hyperlipidemia 12/14/2014  . History of transfusion of packed RBC 12/14/2014  . Hx of smoking 12/14/2014  . Status post bariatric surgery 12/14/2014  . Morbid obesity ([place])  12/14/2014  . Chronic radicular lumbar pain 12/14/2014  . History of GI bleed 11/12/2014  . GERD without esophagitis 11/12/2014  . History of small bowel obstruction 09/07/2014    Everlean Alstrom. [name], PT, DPT 11/24/18, 2:32 PM   [facility] [address], [place], [address] Phone: [phone]   Fax:  [phone]  Name: Andrea Holden MRN: [id number] Date of Birth: [birth year]

## 2018-11-26 ENCOUNTER — Ambulatory Visit: Payer: Medicare Other | Admitting: Physical Therapy

## 2018-11-26 ENCOUNTER — Other Ambulatory Visit: Payer: Self-pay

## 2018-11-26 ENCOUNTER — Encounter: Payer: Self-pay | Admitting: Physical Therapy

## 2018-11-26 VITALS — BP 150/62

## 2018-11-26 DIAGNOSIS — G8929 Other chronic pain: Secondary | ICD-10-CM | POA: Diagnosis not present

## 2018-11-26 DIAGNOSIS — M6281 Muscle weakness (generalized): Secondary | ICD-10-CM | POA: Diagnosis not present

## 2018-11-26 DIAGNOSIS — R262 Difficulty in walking, not elsewhere classified: Secondary | ICD-10-CM

## 2018-11-26 DIAGNOSIS — Z9181 History of falling: Secondary | ICD-10-CM | POA: Diagnosis not present

## 2018-11-26 DIAGNOSIS — M545 Low back pain, unspecified: Secondary | ICD-10-CM

## 2018-11-26 NOTE — Therapy (Signed)
[facility] [address] [place], [address] Phone: [phone]   Fax:  [phone]  Physical Therapy Treatment  Patient Details  Name: Andrea Holden MRN: [id number] Date of Birth: 11-25-1945 Referring Provider (PT): Fredderick Severance, NP    Encounter Date: 11/26/2018  PT End of Session - 11/26/18 1527    Visit Number  4    Number of Visits  24    Date for PT Re-Evaluation  02/09/19    Authorization Type  [facility] Medicare reporting period from 01/12/2019    Authorization Time Period  Current cert period 1/32/4401 - 02/03/19    Authorization - Visit Number  4    Authorization - Number of Visits  10    PT Start Time  1335    PT Stop Time  1415    PT Time Calculation (min)  40 min    Equipment Utilized During Treatment  Gait belt    Activity Tolerance  Patient tolerated treatment well;Patient limited by fatigue;Patient limited by pain    Behavior During Therapy  WFL for tasks assessed/performed       Past Medical History:  Diagnosis Date  . Allergy    seasonal allergies  . Anemia 2014   needed 5 units of blood d/t passing out, weak  . Anxiety   . Aortic stenosis 12/24/2017   Echo Aug 2018  . Arthritis   . Asthma    allergy induced asthma  . Cardiac murmur 12/12/2017  . Cataract   . Cataract    left  . Complication of anesthesia    arrhythmia following colonoscopy  . Degenerative disc disease, lumbar   . Degenerative disc disease, lumbar   . Depression   . Diabetes mellitus   . Diabetic neuropathy ([place]) 11/16/2015  . Dysrhythmia    stenosis of left ventricle  . GERD (gastroesophageal reflux disease)   . H/O transfusion    patient was given 5 units of blood while at [place], blood type O+  . History of chicken pox   . History of hiatal hernia   . History of measles, mumps, or rubella   . HOH (hard of hearing)    does not use hearing aides yet  . Hyperlipidemia   . Hypertension   .  Irregular heartbeat   . LVH (left ventricular hypertrophy) 12/24/2017   Echo Aug 2018  . Neuropathy   . Opiate use 11/16/2015  . Peripheral arterial disease ([place]) 05/06/2018   At rest, left > right; refer to vasc  . Pes planus of both feet 11/16/2015  . Plantar fasciitis of right foot 11/16/2015  . Wheezing     Past Surgical History:  Procedure Laterality Date  . ABDOMINAL HYSTERECTOMY    . APPLICATION OF WOUND VAC Left 07/23/2018   Procedure: APPLICATION OF WOUND VAC;  Surgeon: Algernon Huxley, MD;  Location: [facility] ORS;  Service: Vascular;  Laterality: Left;  . banck injections    . CATARACT EXTRACTION W/PHACO Right 05/30/2015   Procedure: CATARACT EXTRACTION PHACO AND INTRAOCULAR LENS PLACEMENT (IOC);  Surgeon: Birder Robson, MD;  Location: [facility] ORS;  Service: Ophthalmology;  Laterality: Right;  [place] 00:57 AP% 20.9 CDE 11.99 fluid pack lot #[id number]  . CHOLECYSTECTOMY  1970  . COLON SURGERY  2013   blocked colon  . COLONOSCOPY WITH PROPOFOL N/A 01/20/2018   Procedure: COLONOSCOPY WITH PROPOFOL;  Surgeon: Lucilla Lame, MD;  Location: [facility] ENDOSCOPY;  Service: Endoscopy;  Laterality: N/A;  .  ENDARTERECTOMY FEMORAL Left 07/08/2018   Procedure: ENDARTERECTOMY FEMORAL;  Surgeon: Algernon Huxley, MD;  Location: [facility] ORS;  Service: Vascular;  Laterality: Left;  . EYE SURGERY Right    cataract surgery  . FOOT FUSION Right 2018   metal in foot  . GALLBLADDER SURGERY  1970  . GASTRIC BYPASS  2010   lost 178 lbs and regained 40 lbs last few years  . HUMERUS FRACTURE SURGERY Right    metal plate with screws  . internal bleeding  2016   ulcer in past  . LOWER EXTREMITY ANGIOGRAM Left 07/08/2018   Procedure: LOWER EXTREMITY ANGIOGRAM;  Surgeon: Algernon Huxley, MD;  Location: [facility] ORS;  Service: Vascular;  Laterality: Left;  . LOWER EXTREMITY ANGIOGRAPHY Left 05/27/2018   Procedure: LOWER EXTREMITY ANGIOGRAPHY;  Surgeon: Algernon Huxley, MD;  Location: [facility];  Service: Cardiovascular;   Laterality: Left;  . MOUTH SURGERY     root canals and crowns and extractions  . OVARY SURGERY    . SKIN GRAFT Right 2018   RT foot. foot has been rebuilt.  it is full of metal  . TENOTOMY ACHILLES TENDON Right    Percuntaneous. metal in foot  . TONSILLECTOMY    . WOUND DEBRIDEMENT Left 07/23/2018   Procedure: DEBRIDEMENT WOUND;  Surgeon: Algernon Huxley, MD;  Location: [facility] OR[facility];  Service: Vascular;  Laterality: Left;    Vitals:   11/26/18 1342 11/26/18 1345  BP: (!) 168/58 (!) 150/62    Subjective Assessment - 11/26/18 1347    Subjective  Patient reports she has started feeling a lot stronger lately and feels the exercise she has been doing is helping. She alwasy has pain and feels 4/10 pain in the left lumbar region but it is not concerning her. She feels more hopefull than she has for a long time about getting stronger and improving participation in her valued activities. She states she suffered less than usual after taking her dogs to get groomed. She is performing 30 sit to stands twice dialy.  She has been very sore lately but it is tolerable. States she has not smoked since Monday.     Pertinent History  Patient is a 73 y.o. female who presents to outpatient physical therapy with a referral for medical diagnosis of degeneration of lumbar intervertebral disc. This patient's chief complaints consist of chronic low back pain and foot pain, weakness, stiffness, leading to the following functional deficits: difficulty with with basic ADLs, IADLs, ambulation. She used to get injections to help control back pain but it has recently worsened due to being unable to get injections during [place] pandemic. The patient has been informed of current processes in place at [facility]patient [facility] to protect patients from Covid-19 exposure including social distancing, schedule modifications, and new cleaning procedures. After discussing their particular risk with a therapist based on the patient's personal risk  factors, the patient has decided to proceed with in-person therapy. Pt is scheduled for R LE angiography on 12/07/2018. She has recently started smoking again with the stress of COVID19 pandemic and feels strongly she needs to stop. Surgeon has stated in the past he will not complete surgery if she is smoking.  Relevant past medical history and comorbidities include underwent extensive vascular surgeries in Jan 2020 to improve blood flow, similar surgery for R leg scheduled 12/07/2018. Has a history of left carotid artery stenosis, but not enough to have surgery, has a history of multiple arterial surgeries, history of R  cataract surgery, colon surgery, cholecystectomy, R foot skin graft, ovary surgery, gastric bypass, R foot fusion, abdominal hysterectomy; current smoker, plantar fasciitis, peripheral arterial disease, LVH, diabetes mellitus with neuropathy, irregular heartbeat, hiatal hernia, GERD, stenosis of left heart ventricle, depression, lumbar degenerative disc disease, cardiac murmur, asthma, anxiety, obesity, HTN, and seasonal allergies She has been advised not to lift over 10 pounds by physician    Limitations  Sitting;Standing;Walking;House hold activities;Lifting    How long can you sit comfortably?  <1 hour    How long can you stand comfortably?  1 minute    How long can you walk comfortably?  2 minutes, depends on what she has with her. Able to walk at fair with rollator. To mailbox 120 feet causes shaking in R leg.      Diagnostic tests  MRI lumbar spine report 06/14/2017: "IMPRESSION: 1. Stable degenerative of lumbar spondylosis and scoliosis with multilevel disc disease and facet disease. 2. Stable right foraminal stenosis at L5-S1 due to right-sided disc    Patient Stated Goals  patient would like to get back to PLOF, walking further, be better able to participate in dog showing and trips    Currently in Pain?  Yes    Pain Score  4     Pain Location  Back    Pain Type  Chronic pain    Pain  Radiating Towards  bilateral glutes    Pain Onset  More than a month ago          TREATMENT:   Therapeutic exercise:to centralize symptoms and improve ROM, strength, muscular endurance, and activity tolerance required for successful completion of functional activities. - blood pressure measurement to ensure safety of exercise. Pt requested it be taken manually because she feels this get a more accurate BP with systolic close to 559 mmHg. BP continued to be fairly consistent with previous readings, elevated but safe for exercise. (see above for values). -NuStep level1using bilateral upper and lower extremities. Seat setting 8/handle setting 7. For improved extremity mobility, muscular endurance, and activity tolerance; and to induce the analgesic effect of aerobic exercise, stimulate improved joint nutrition, and prepare body structures and systems for following interventions. x37mnutes with SPM above 60. During subjective exam.  - sidelying hip abduction 2x10 each side. Required physical assistance and cuing to get in true sidelying. To improve single leg stance pelvis stability for ambulation.  - supine bridges 2x10, improved ability to get to full ROM. To strengthen hip extension for functional exercise. Cuing for proper form.  - prone with toes curled under, knee extension bilaterally with leg lift into extension x 10 each side. To improve hip extension ROM and strength as well as quad strength for improved functional strength. Leg lift better engaged glutes. Cuing for technique.  - Prone hip extension with knee flexed to preferentially bias gluteus maximus. x10 Cuing to keep knee flexed. To improve functional strength.  - Passive assessment of hip extension in sidelying bilaterally: WFL for upright standing and ambulation.  - standing hip hikes with BUE 2x 10 each side with intensive cuing. Limited ability to complete R hikes with L hip abductors working at end of each set due to  fatigue. Educated on importance of strengthening this area for normal gait and balance.  - standing side stepping with BUE support with yellow theraband around ankles. Cuing for upright posture and to prevent sinking into left hip. Supervision for safety. To improve functional hip strength. -Education on HLeonardo  Access Code: [email or web address]  URL: [email or web address]  Date: 11/24/2018  Prepared by: Rosita Kea   Exercises   Sit to Stand without Arm Support - 30 reps - 2x daily   Standing Hip Hiking - 3 sets - 10 reps - 1x daily   Sidelying Clamshell in Neutral - 3 sets - 10 reps - 2x daily   Supine Bridge - 3 sets - 10 reps - 1 second hold - 2x daily   PT Education - 11/26/18 1526    Education provided  Yes    Education Details  Exercise purpose/form. Self management techniques. Education on diagnosis, prognosis, POC, anatomy and physiology of current condition Education on HEP    Person(s) Educated  Patient    Methods  Explanation;Demonstration;Verbal cues    Comprehension  Verbalized understanding;Returned demonstration;Verbal cues required;Tactile cues required;Need further instruction       PT Short Term Goals - 11/24/18 1426      PT SHORT TERM GOAL #1   Title  Be independent with initial home exercise program for self-management of symptoms.    Baseline  Initial HEP provided at IE (11/17/2018);    Time  2    Period  Weeks    Status  Partially Met    Target Date  12/01/18        PT Long Term Goals - 11/24/18 1426      PT LONG TERM GOAL #1   Title  Be independent with a long-term home exercise program for self-management of symptoms.     Baseline  Initial HEP provided at IE (11/17/2018);    Time  12    Period  Weeks    Status  New    Target Date  02/09/19      PT LONG TERM GOAL #2   Title  Patient will improve 6MWT to equal or greater than 1000 feet with LRAD to demonstrate improved activity tolerance for community ambulation.      Baseline  6 Minute Walk Test: 200 feet with SPC and supervision. Stopped at 1:43 due to fatigue and leg pain. (11/17/2018);    Time  12    Period  Weeks    Status  New    Target Date  02/09/19      PT LONG TERM GOAL #3   Title  Patient will demonstrate improved 5 Times Sit to Stand test to 11 seconds from chair height with no UE support to demonstrate improvement in LE power and functional strength for daily activities.    Baseline  5 Time Sit to Stand: 27 seconds from chair height no UE support (11/17/2018);     Time  12    Period  Weeks    Status  New    Target Date  02/09/19      PT LONG TERM GOAL #4   Title  Patient will ambulate faster than 1.56ms on the 10 Meter Walk Test to improve ability to cross street safey for community participation.     Baseline  testing deferred to next session (11/17/2018); 1.37 m/sec with SPC (11/24/2018);     Time  12    Period  Weeks    Status  Achieved    Target Date  02/09/19      PT LONG TERM GOAL #5   Title  Patient will score equal or greater than  25/30 on the Functional Gait Assessment to demonstrate low fall risk to improve her ability to participate safely in community activities.  Baseline  testing deferred to next session (11/17/2018); 11/30 high fall risk (11/24/2018);    Time  12    Period  Weeks    Status  On-going    Target Date  02/09/19            Plan - 11/26/18 1537    Clinical Impression Statement  Patient tolerated treatment well and is making appropriate progress towards goals at this point. She showed improved ability to activate hip extensors and abductors, although she continues to fatigue quickly, especially with left hip exercises. Patient demo improved form and ability to complete repetitions of exercises today.  She was at times limited by pain in the low back and bilateral glutes and felt stiff and sore in the low back following exercises. Pt required multimodal cuing for proper technique and to facilitate improved  neuromuscular control, strength, range of motion, and functional ability resulting in improved performance and form. Patient would benefit from continued physical therapy to address remaining impairments and functional limitations to work towards stated goals and return to PLOF or maximal functional independence    Personal Factors and Comorbidities  Age;Comorbidity 3+;Fitness;Time since onset of injury/illness/exacerbation;Past/Current Experience;Other   significant anxiety affecting wellbeing and wellness behaviors   Comorbidities  underwent extensive vascular surgeries in Jan 2020 to improve blood flow, similar surgery for R leg scheduled 12/07/2018. Has a history of left carotid artery stenosis, but not enough to have surgery, has a history of multiple arterial surgeries, history of R cataract surgery, colon surgery, cholecystectomy, R foot skin graft, ovary surgery, gastric bypass, R foot fusion, abdominal hysterectomy; current smoker, plantar fasciitis, peripheral arterial disease, LVH, diabetes mellitus with neuropathy, irregular heartbeat, hiatal hernia, GERD, stenosis of left heart ventricle, depression, lumbar degenerative disc disease, cardiac murmur, asthma, anxiety, obesity, HTN, and seasonal allergies She has been advised not to lift over 10 pounds by physician.      Examination-Activity Limitations  Bathing;Carry;Lift;Sit;Stand;Locomotion Level;Toileting;Dressing;Squat;Transfers;Stairs;Hygiene/Grooming;Other   ADLs, IADLs, and valued community and social activities including dog showing, transferring, ambulating household and community distances, squatting, stooping, dog handling/training, and stairs   Examination-Participation Restrictions  Community Activity;Volunteer;Cleaning;Interpersonal Relationship;Yard Social research officer, government, participation in Chartered certified accountant   Stability/Clinical Decision Making  Evolving/Moderate complexity    Rehab Potential  Fair    Clinical Impairments  Affecting Rehab Potential  (+) motivated (-) muliple co morbidities, chronic condition    PT Frequency  2x / week    PT Duration  12 weeks    PT Treatment/Interventions  Moist Heat;Patient/family education;Neuromuscular re-education;Therapeutic exercise;Manual techniques;ADLs/Self Care Home Management;Electrical Stimulation;Gait training;Stair training;Functional mobility training;Therapeutic activities;Balance training;Passive range of motion;Dry needling;Energy conservation;Spinal Manipulations;Joint Manipulations;Cryotherapy   joint mobilizations grades I-IV   PT Next Visit Plan  continue with progressive functional strengtheing    PT Home Exercise Plan  Medbridge Access Code: [email or web address]    Consulted and Agree with Plan of Care  Patient       Patient will benefit from skilled therapeutic intervention in order to improve the following deficits and impairments:  Decreased strength, Impaired flexibility, Decreased activity tolerance, Impaired perceived functional ability, Pain, Decreased endurance, Difficulty walking, Abnormal gait, Decreased knowledge of use of DME, Decreased skin integrity, Decreased range of motion, Impaired sensation, Improper body mechanics, Obesity, Postural dysfunction, Increased edema, Decreased mobility, Decreased balance, Cardiopulmonary status limiting activity, Decreased coordination  Visit Diagnosis: Muscle weakness (generalized)  Chronic bilateral low back pain, unspecified whether sciatica present  Difficulty in walking, not elsewhere classified  History of falling  Problem List Patient Active Problem List   Diagnosis Date Noted  . Lower extremity edema 07/31/2018  . Pressure injury of skin 07/24/2018  . Surgical site infection 07/23/2018  . Wound of left leg 07/23/2018  . Atherosclerosis of artery of extremity with rest pain ([place]) 07/08/2018  . Shortness of breath 06/02/2018  . Bruit 06/02/2018  . Coronary artery disease of native artery of native  heart with stable angina pectoris ([place]) 06/02/2018  . Atherosclerosis of native arteries of extremity with intermittent claudication ([place]) 05/19/2018  . Peripheral arterial disease ([place]) 05/06/2018  . Personal history of colonic polyps   . Aortic stenosis 12/24/2017  . LVH (left ventricular hypertrophy) 12/24/2017  . Hypertension with heart disease 12/24/2017  . Left atrial dilatation 12/12/2017  . Cardiac murmur 12/12/2017  . Gastrojejunal ulcer 12/04/2017  . Hyperphosphatemia 12/04/2017  . Spinal stenosis of lumbar region 06/18/2017  . Degeneration of lumbar intervertebral disc 06/02/2017  . Assistance needed with transportation 05/26/2017  . Financial difficulties 05/26/2017  . Needs assistance with community resources 05/26/2017  . Moderate recurrent major depression ([place]) 05/26/2017  . Non-healing wound of lower extremity 02/06/2017  . Status post ankle arthrodesis 12/11/2016  . Controlled substance agreement broken 10/11/2016  . Low back pain 09/25/2016  . Osteoarthritis of right subtalar joint 07/11/2016  . Posterior tibial tendinitis of right lower extremity 07/11/2016  . Breast cancer screening 06/18/2016  . Knee pain 04/03/2016  . Hyponatremia 03/01/2016  . High triglycerides 12/24/2015  . Pes planus of both feet 11/16/2015  . Plantar fasciitis of right foot 11/16/2015  . Heel spur 11/16/2015  . Diabetic neuropathy ([place]) 11/16/2015  . Right ankle pain 11/16/2015  . Medication monitoring encounter 11/16/2015  . Chronic pain of multiple joints 08/15/2015  . Abnormal mammogram of right breast 06/19/2015  . Cerumen impaction 03/16/2015  . Hyperlipidemia 12/14/2014  . History of transfusion of packed RBC 12/14/2014  . Hx of smoking 12/14/2014  . Status post bariatric surgery 12/14/2014  . Morbid obesity ([place]) 12/14/2014  . Chronic radicular lumbar pain 12/14/2014  . History of GI bleed 11/12/2014  . GERD without esophagitis 11/12/2014  . History of small bowel  obstruction 09/07/2014    Everlean Alstrom. [name], PT, DPT 11/26/18, 3:39 PM  [facility] [address], [place], [address] Phone: [phone]   Fax:  [phone]  Name: RUFUS CYPERT MRN: [id number] Date of Birth: 11-15-1945

## 2018-11-30 ENCOUNTER — Ambulatory Visit: Payer: Self-pay | Admitting: *Deleted

## 2018-11-30 DIAGNOSIS — Z789 Other specified health status: Secondary | ICD-10-CM

## 2018-11-30 DIAGNOSIS — Z599 Problem related to housing and economic circumstances, unspecified: Secondary | ICD-10-CM

## 2018-11-30 DIAGNOSIS — Z598 Other problems related to housing and economic circumstances: Secondary | ICD-10-CM

## 2018-11-30 NOTE — Chronic Care Management (AMB) (Signed)
  Chronic Care Management    Clinical Social Work Follow Up Note  11/30/2018 Name: Andrea Holden MRN: [id number] DOB: May 13, 1946  Erinn J Buchler is a 73 y.o. year old female who is a primary care patient of Lada, Janit Bern, MD. The CCM team was consulted for assistance with applying for food stamps.  Review of patient status, including review of consultants reports, other relevant assessments, and collaboration with appropriate care team members and the patient's provider was performed as part of comprehensive patient evaluation and provision of chronic care management services.     Goals Addressed            This Visit's Progress   . I need help applying for food stamps (pt-stated)       Current Barriers:  . Financial constraints . Limited social support . Family and relationship dysfunction . Social Isolation  Clinical Social Work Clinical Goal(s):  Marland Kitchen Over the next 30 days, client will work with SW to address concerns related to applying for food stamps  Interventions: . Patient interviewed and appropriate assessments performed . Confirmed with patient that she has received a denial letter for food stamps . Advised patient to contact her assigned worker at the Department of Social Services to discuss denial reason . Patient's current mood evaluated regarding scheduled procedure on 12/07/2018 place stints in her right leg . Positive reinforcement provided in response to patient's confirmation that she has stopped smoking. Per patient, she has not smoked a cigarette since 11/23/2018. Marland Kitchen Discussed plans with patient for ongoing care management follow up and provided patient with direct contact information for care management team   Patient Self Care Activities:  . Self administers medications as prescribed . Attends all scheduled provider appointments . Performs ADL's independently . Performs IADL's independently  Please see past updates related to this goal by clicking on  the "Past Updates" button in the selected goal          Follow Up Plan: SW will follow up with patient by phone over the next 2 weeks    Chrystal Land, LCSW Clinical Social Worker  [facility] Care Management [phone]

## 2018-11-30 NOTE — Patient Instructions (Signed)
Thank you allowing the Chronic Care Management Team to be a part of your care! It was a pleasure speaking with you today!  1. Please follow up with your assigned worker at the Department of Social Services to discuss reason for denial for food stamps. 2. Please call this social worker with any questions or concerns.    CCM (Chronic Care Management) Team   Yvone Neu RN, BSN Nurse Care Coordinator  [phone]  Karalee Height PharmD  Clinical Pharmacist  [phone]   Verna Czech, LCSW Clinical Social Worker [phone]  Goals Addressed            This Visit's Progress   . I need help applying for food stamps (pt-stated)       Current Barriers:  . Financial constraints . Limited social support . Family and relationship dysfunction . Social Isolation  Clinical Social Work Clinical Goal(s):  Marland Kitchen Over the next 30 days, client will work with SW to address concerns related to applying for food stamps  Interventions: . Patient interviewed and appropriate assessments performed . Confirmed with patient that she has received a denial letter for food stamps . Advised patient to contact her assigned worker at the Department of Social Services to discuss denial reason . Patient's current mood evaluated regarding scheduled procedure on 12/07/2018 place stints in her right leg . Positive reinforcement provided in response to patient's confirmation that she has stopped smoking. Per patient, she has not smoked a cigarette since 11/23/2018. Marland Kitchen Discussed plans with patient for ongoing care management follow up and provided patient with direct contact information for care management team   Patient Self Care Activities:  . Self administers medications as prescribed . Attends all scheduled provider appointments . Performs ADL's independently . Performs IADL's independently  Please see past updates related to this goal by clicking on the "Past Updates" button in the selected goal           The patient verbalized understanding of instructions provided today and declined a print copy of patient instruction materials.   The care management team will reach out to the patient again over the next 14 days.  The patient will call her assigned worker with the Department of Social Services as advised to follow up on food stamp denial.

## 2018-12-01 ENCOUNTER — Encounter: Payer: Self-pay | Admitting: Physical Therapy

## 2018-12-01 ENCOUNTER — Other Ambulatory Visit: Payer: Self-pay

## 2018-12-01 ENCOUNTER — Ambulatory Visit: Payer: Medicare Other | Attending: Nurse Practitioner | Admitting: Physical Therapy

## 2018-12-01 ENCOUNTER — Other Ambulatory Visit (INDEPENDENT_AMBULATORY_CARE_PROVIDER_SITE_OTHER): Payer: Self-pay | Admitting: Nurse Practitioner

## 2018-12-01 VITALS — BP 148/72 | HR 76

## 2018-12-01 DIAGNOSIS — R262 Difficulty in walking, not elsewhere classified: Secondary | ICD-10-CM

## 2018-12-01 DIAGNOSIS — M545 Low back pain, unspecified: Secondary | ICD-10-CM

## 2018-12-01 DIAGNOSIS — Z9181 History of falling: Secondary | ICD-10-CM | POA: Diagnosis not present

## 2018-12-01 DIAGNOSIS — M6281 Muscle weakness (generalized): Secondary | ICD-10-CM | POA: Diagnosis not present

## 2018-12-01 DIAGNOSIS — G8929 Other chronic pain: Secondary | ICD-10-CM | POA: Diagnosis not present

## 2018-12-01 NOTE — Therapy (Signed)
[facility] [address], [place], [address] Phone: [phone]   Fax:  [phone]  Physical Therapy Treatment  Patient Details  Name: Andrea Holden MRN: [id number] Date of Birth: Oct 29, 1945 Referring Provider (PT): Fredderick Severance, NP    Encounter Date: 12/01/2018  PT End of Session - 12/01/18 1350    Visit Number  5    Number of Visits  24    Date for PT Re-Evaluation  02/09/19    Authorization Type  [facility] Medicare reporting period from 01/12/2019    Authorization Time Period  Current cert period 12/25/348 - 02/03/19    Authorization - Visit Number  5    Authorization - Number of Visits  10    PT Start Time  1340    PT Stop Time  1410    PT Time Calculation (min)  30 min    Equipment Utilized During Treatment  Gait belt    Activity Tolerance  Patient tolerated treatment well;Patient limited by fatigue;Patient limited by pain    Behavior During Therapy  WFL for tasks assessed/performed       Past Medical History:  Diagnosis Date  . Allergy    seasonal allergies  . Anemia 2014   needed 5 units of blood d/t passing out, weak  . Anxiety   . Aortic stenosis 12/24/2017   Echo Aug 2018  . Arthritis   . Asthma    allergy induced asthma  . Cardiac murmur 12/12/2017  . Cataract   . Cataract    left  . Complication of anesthesia    arrhythmia following colonoscopy  . Degenerative disc disease, lumbar   . Degenerative disc disease, lumbar   . Depression   . Diabetes mellitus   . Diabetic neuropathy ([place]) 11/16/2015  . Dysrhythmia    stenosis of left ventricle  . GERD (gastroesophageal reflux disease)   . H/O transfusion    patient was given 5 units of blood while at [place], blood type O+  . History of chicken pox   . History of hiatal hernia   . History of measles, mumps, or rubella   . HOH (hard of hearing)    does not use hearing aides yet  . Hyperlipidemia   . Hypertension   .  Irregular heartbeat   . LVH (left ventricular hypertrophy) 12/24/2017   Echo Aug 2018  . Neuropathy   . Opiate use 11/16/2015  . Peripheral arterial disease ([place]) 05/06/2018   At rest, left > right; refer to vasc  . Pes planus of both feet 11/16/2015  . Plantar fasciitis of right foot 11/16/2015  . Wheezing     Past Surgical History:  Procedure Laterality Date  . ABDOMINAL HYSTERECTOMY    . APPLICATION OF WOUND VAC Left 07/23/2018   Procedure: APPLICATION OF WOUND VAC;  Surgeon: Algernon Huxley, MD;  Location: [facility] ORS;  Service: Vascular;  Laterality: Left;  . banck injections    . CATARACT EXTRACTION W/PHACO Right 05/30/2015   Procedure: CATARACT EXTRACTION PHACO AND INTRAOCULAR LENS PLACEMENT (IOC);  Surgeon: Birder Robson, MD;  Location: [facility] ORS;  Service: Ophthalmology;  Laterality: Right;  [place] 00:57 AP% 20.9 CDE 11.99 fluid pack lot #[id number]  . CHOLECYSTECTOMY  1970  . COLON SURGERY  2013   blocked colon  . COLONOSCOPY WITH PROPOFOL N/A 01/20/2018   Procedure: COLONOSCOPY WITH PROPOFOL;  Surgeon: Lucilla Lame, MD;  Location: [facility] ENDOSCOPY;  Service: Endoscopy;  Laterality: N/A;  .  ENDARTERECTOMY FEMORAL Left 07/08/2018   Procedure: ENDARTERECTOMY FEMORAL;  Surgeon: Algernon Huxley, MD;  Location: [facility] ORS;  Service: Vascular;  Laterality: Left;  . EYE SURGERY Right    cataract surgery  . FOOT FUSION Right 2018   metal in foot  . GALLBLADDER SURGERY  1970  . GASTRIC BYPASS  2010   lost 178 lbs and regained 40 lbs last few years  . HUMERUS FRACTURE SURGERY Right    metal plate with screws  . internal bleeding  2016   ulcer in past  . LOWER EXTREMITY ANGIOGRAM Left 07/08/2018   Procedure: LOWER EXTREMITY ANGIOGRAM;  Surgeon: Algernon Huxley, MD;  Location: [facility] ORS;  Service: Vascular;  Laterality: Left;  . LOWER EXTREMITY ANGIOGRAPHY Left 05/27/2018   Procedure: LOWER EXTREMITY ANGIOGRAPHY;  Surgeon: Algernon Huxley, MD;  Location: [facility];  Service: Cardiovascular;   Laterality: Left;  . MOUTH SURGERY     root canals and crowns and extractions  . OVARY SURGERY    . SKIN GRAFT Right 2018   RT foot. foot has been rebuilt.  it is full of metal  . TENOTOMY ACHILLES TENDON Right    Percuntaneous. metal in foot  . TONSILLECTOMY    . WOUND DEBRIDEMENT Left 07/23/2018   Procedure: DEBRIDEMENT WOUND;  Surgeon: Algernon Huxley, MD;  Location: [facility] ORS;  Service: Vascular;  Laterality: Left;    Vitals:   12/01/18 1346  BP: (!) 148/72  Pulse: 76  SpO2: 97%    Subjective Assessment - 12/01/18 1347    Subjective  Patient reports she has 4-5/10 pain in the lumbar spine and bilateral upper glutes, which is her usual spot. She states she is feeling well today. Has not smoked in more than 1 week and her blood pressure seems to be normalizing. She reports she has not gotten as much of her HEP done since last visit but has been very busy cleaning out her winter clothes.  She felt okay following last treatment session.     Pertinent History  Patient is a 73 y.o. female who presents to outpatient physical therapy with a referral for medical diagnosis of degeneration of lumbar intervertebral disc. This patient's chief complaints consist of chronic low back pain and foot pain, weakness, stiffness, leading to the following functional deficits: difficulty with with basic ADLs, IADLs, ambulation. She used to get injections to help control back pain but it has recently worsened due to being unable to get injections during [place] pandemic. The patient has been informed of current processes in place at [facility]patient [facility]hab to protect patients from Covid-19 exposure including social distancing, schedule modifications, and new cleaning procedures. After discussing their particular risk with a therapist based on the patient's personal risk factors, the patient has decided to proceed with in-person therapy. Pt is scheduled for R LE angiography on 12/07/2018. She has recently started smoking again  with the stress of COVID19 pandemic and feels strongly she needs to stop. Surgeon has stated in the past he will not complete surgery if she is smoking.  Relevant past medical history and comorbidities include underwent extensive vascular surgeries in Jan 2020 to improve blood flow, similar surgery for R leg scheduled 12/07/2018. Has a history of left carotid artery stenosis, but not enough to have surgery, has a history of multiple arterial surgeries, history of R cataract surgery, colon surgery, cholecystectomy, R foot skin graft, ovary surgery, gastric bypass, R foot fusion, abdominal hysterectomy; current smoker, plantar fasciitis, peripheral arterial  disease, LVH, diabetes mellitus with neuropathy, irregular heartbeat, hiatal hernia, GERD, stenosis of left heart ventricle, depression, lumbar degenerative disc disease, cardiac murmur, asthma, anxiety, obesity, HTN, and seasonal allergies She has been advised not to lift over 10 pounds by physician    Limitations  Sitting;Standing;Walking;House hold activities;Lifting    How long can you sit comfortably?  <1 hour    How long can you stand comfortably?  1 minute    How long can you walk comfortably?  2 minutes, depends on what she has with her. Able to walk at fair with rollator. To mailbox 120 feet causes shaking in R leg.      Diagnostic tests  MRI lumbar spine report 06/14/2017: "IMPRESSION: 1. Stable degenerative of lumbar spondylosis and scoliosis with multilevel disc disease and facet disease. 2. Stable right foraminal stenosis at L5-S1 due to right-sided disc    Patient Stated Goals  patient would like to get back to PLOF, walking further, be better able to participate in dog showing and trips    Currently in Pain?  Yes    Pain Score  4     Pain Location  Back    Pain Onset  More than a month ago         TREATMENT:   Therapeutic exercise:to centralize symptoms and improve ROM, strength, muscular endurance, and activity tolerance required  for successful completion of functional activities. - blood pressure measurement manually measured at the wrist to ensure safety of exercise. Pt requested it be taken manually because she feels this get a more accurate BP.  elevated but safe for exercise. (see above for values). -NuStep level1using bilateral upper and lower extremities. Seatsetting 8/handle setting7. For improved extremity mobility, muscular endurance, and activity tolerance; and to induce the analgesic effect of aerobic exercise, stimulate improved joint nutrition, and prepare body structures and systems for following interventions. x103mnuteswith SPM above 60. During subjective exam.  - sidelying hip abduction 3x10 each side. Required physical assistance and cuing to get in true sidelying. To improve single leg stance pelvis stability for ambulation.  - supine bridges x 10 no weight, 2x10 with 5# dumbbell over each hip, improved ability to get to full ROM. To strengthen hip extension for functional exercise. Cuing for proper form. - Prone hip extension with knee flexed to preferentially bias gluteus maximus. 3x10 each side with 2# dumbbell behind knee Cuing to keep knee flexed. To improve functional strength.  - hooklying bridge with green theraband around distal thighs with isometric hip abduction. 2x10. Added to HEP with education about purpose. Provided green theraband.   HOME EXERCISE PROGRAM Access Code: [email or web address] URL: [email or web address]  Date: 12/01/2018  Prepared by: SRosita Kea  Exercises  Sit to Stand without Arm Support - 30 reps - 1x daily  Standing Hip Hiking - 3 sets - 10 reps - 1x daily  Sidelying Clamshell in Neutral - 3 sets - 10 reps - 2x daily  Supine Bridge with Resistance Band - 3 sets - 10 reps - 1 second hold - 2x daily    PT Education - 12/01/18 1350    Education provided  Yes    Education Details  Exercise purpose/form. Self management techniques. Education on diagnosis,  prognosis, POC, anatomy and physiology of current condition    Person(s) Educated  Patient    Methods  Explanation;Demonstration;Tactile cues;Verbal cues    Comprehension  Verbalized understanding;Returned demonstration;Verbal cues required;Tactile cues required       PT Short Term Goals -  12/01/18 1429      PT SHORT TERM GOAL #1   Title  Be independent with initial home exercise program for self-management of symptoms.    Baseline  Initial HEP provided at IE (11/17/2018);    Time  2    Period  Weeks    Status  Partially Met    Target Date  12/01/18        PT Long Term Goals - 11/24/18 1426      PT LONG TERM GOAL #1   Title  Be independent with a long-term home exercise program for self-management of symptoms.     Baseline  Initial HEP provided at IE (11/17/2018);    Time  12    Period  Weeks    Status  New    Target Date  02/09/19      PT LONG TERM GOAL #2   Title  Patient will improve 6MWT to equal or greater than 1000 feet with LRAD to demonstrate improved activity tolerance for community ambulation.     Baseline  6 Minute Walk Test: 200 feet with SPC and supervision. Stopped at 1:43 due to fatigue and leg pain. (11/17/2018);    Time  12    Period  Weeks    Status  New    Target Date  02/09/19      PT LONG TERM GOAL #3   Title  Patient will demonstrate improved 5 Times Sit to Stand test to 11 seconds from chair height with no UE support to demonstrate improvement in LE power and functional strength for daily activities.    Baseline  5 Time Sit to Stand: 27 seconds from chair height no UE support (11/17/2018);     Time  12    Period  Weeks    Status  New    Target Date  02/09/19      PT LONG TERM GOAL #4   Title  Patient will ambulate faster than 1.34ms on the 10 Meter Walk Test to improve ability to cross street safey for community participation.     Baseline  testing deferred to next session (11/17/2018); 1.37 m/sec with SPC (11/24/2018);     Time  12    Period  Weeks     Status  Achieved    Target Date  02/09/19      PT LONG TERM GOAL #5   Title  Patient will score equal or greater than  25/30 on the Functional Gait Assessment to demonstrate low fall risk to improve her ability to participate safely in community activities.     Baseline  testing deferred to next session (11/17/2018); 11/30 high fall risk (11/24/2018);    Time  12    Period  Weeks    Status  On-going    Target Date  02/09/19            Plan - 12/01/18 1428    Clinical Impression Statement  Patient arrived 10 min late and had to leave 5 min early due to following dental appointment to address tooth pain. Exercises focused on improving hip strength for improved gait pattern and single leg stance. Patient is making progress towards goals and was able to advance intensity and reps of several exercises. She has been doing well with her smoking cessation efforts with improved blood pressure and endurance. Patient required close guarding and min A for most bed mobility and to get into and maintain proper positioning. Pt required multimodal cuing for proper technique and  to facilitate improved neuromuscular control, strength, range of motion, and functional ability resulting in improved performance and form. Patient would benefit from continued physical therapy to address remaining impairments and functional limitations to work towards stated goals and return to PLOF or maximal functional independence    Personal Factors and Comorbidities  Age;Comorbidity 3+;Fitness;Time since onset of injury/illness/exacerbation;Past/Current Experience;Other   significant anxiety affecting wellbeing and wellness behaviors   Comorbidities  underwent extensive vascular surgeries in Jan 2020 to improve blood flow, similar surgery for R leg scheduled 12/07/2018. Has a history of left carotid artery stenosis, but not enough to have surgery, has a history of multiple arterial surgeries, history of R cataract surgery, colon  surgery, cholecystectomy, R foot skin graft, ovary surgery, gastric bypass, R foot fusion, abdominal hysterectomy; current smoker, plantar fasciitis, peripheral arterial disease, LVH, diabetes mellitus with neuropathy, irregular heartbeat, hiatal hernia, GERD, stenosis of left heart ventricle, depression, lumbar degenerative disc disease, cardiac murmur, asthma, anxiety, obesity, HTN, and seasonal allergies She has been advised not to lift over 10 pounds by physician.      Examination-Activity Limitations  Bathing;Carry;Lift;Sit;Stand;Locomotion Level;Toileting;Dressing;Squat;Transfers;Stairs;Hygiene/Grooming;Other   ADLs, IADLs, and valued community and social activities including dog showing, transferring, ambulating household and community distances, squatting, stooping, dog handling/training, and stairs   Examination-Participation Restrictions  Community Activity;Volunteer;Cleaning;Interpersonal Relationship;Yard Social research officer, government, participation in Chartered certified accountant   Stability/Clinical Decision Making  Evolving/Moderate complexity    Rehab Potential  Fair    Clinical Impairments Affecting Rehab Potential  (+) motivated (-) muliple co morbidities, chronic condition    PT Frequency  2x / week    PT Duration  12 weeks    PT Treatment/Interventions  Moist Heat;Patient/family education;Neuromuscular re-education;Therapeutic exercise;Manual techniques;ADLs/Self Care Home Management;Electrical Stimulation;Gait training;Stair training;Functional mobility training;Therapeutic activities;Balance training;Passive range of motion;Dry needling;Energy conservation;Spinal Manipulations;Joint Manipulations;Cryotherapy   joint mobilizations grades I-IV   PT Next Visit Plan  continue with progressive functional strengtheing    PT Home Exercise Plan  Medbridge Access Code: [email or web address]    Consulted and Agree with Plan of Care  Patient       Patient will benefit from skilled therapeutic intervention in order  to improve the following deficits and impairments:  Decreased strength, Impaired flexibility, Decreased activity tolerance, Impaired perceived functional ability, Pain, Decreased endurance, Difficulty walking, Abnormal gait, Decreased knowledge of use of DME, Decreased skin integrity, Decreased range of motion, Impaired sensation, Improper body mechanics, Obesity, Postural dysfunction, Increased edema, Decreased mobility, Decreased balance, Cardiopulmonary status limiting activity, Decreased coordination  Visit Diagnosis: Muscle weakness (generalized)  Chronic bilateral low back pain, unspecified whether sciatica present  Difficulty in walking, not elsewhere classified  History of falling     Problem List Patient Active Problem List   Diagnosis Date Noted  . Lower extremity edema 07/31/2018  . Pressure injury of skin 07/24/2018  . Surgical site infection 07/23/2018  . Wound of left leg 07/23/2018  . Atherosclerosis of artery of extremity with rest pain ([place]) 07/08/2018  . Shortness of breath 06/02/2018  . Bruit 06/02/2018  . Coronary artery disease of native artery of native heart with stable angina pectoris ([place]) 06/02/2018  . Atherosclerosis of native arteries of extremity with intermittent claudication ([place]) 05/19/2018  . Peripheral arterial disease ([place]) 05/06/2018  . Personal history of colonic polyps   . Aortic stenosis 12/24/2017  . LVH (left ventricular hypertrophy) 12/24/2017  . Hypertension with heart disease 12/24/2017  . Left atrial dilatation 12/12/2017  . Cardiac murmur 12/12/2017  . Gastrojejunal ulcer 12/04/2017  .  Hyperphosphatemia 12/04/2017  . Spinal stenosis of lumbar region 06/18/2017  . Degeneration of lumbar intervertebral disc 06/02/2017  . Assistance needed with transportation 05/26/2017  . Financial difficulties 05/26/2017  . Needs assistance with community resources 05/26/2017  . Moderate recurrent major depression ([place]) 05/26/2017  . Non-healing  wound of lower extremity 02/06/2017  . Status post ankle arthrodesis 12/11/2016  . Controlled substance agreement broken 10/11/2016  . Low back pain 09/25/2016  . Osteoarthritis of right subtalar joint 07/11/2016  . Posterior tibial tendinitis of right lower extremity 07/11/2016  . Breast cancer screening 06/18/2016  . Knee pain 04/03/2016  . Hyponatremia 03/01/2016  . High triglycerides 12/24/2015  . Pes planus of both feet 11/16/2015  . Plantar fasciitis of right foot 11/16/2015  . Heel spur 11/16/2015  . Diabetic neuropathy ([place]) 11/16/2015  . Right ankle pain 11/16/2015  . Medication monitoring encounter 11/16/2015  . Chronic pain of multiple joints 08/15/2015  . Abnormal mammogram of right breast 06/19/2015  . Cerumen impaction 03/16/2015  . Hyperlipidemia 12/14/2014  . History of transfusion of packed RBC 12/14/2014  . Hx of smoking 12/14/2014  . Status post bariatric surgery 12/14/2014  . Morbid obesity ([place]) 12/14/2014  . Chronic radicular lumbar pain 12/14/2014  . History of GI bleed 11/12/2014  . GERD without esophagitis 11/12/2014  . History of small bowel obstruction 09/07/2014    Everlean Alstrom. [name], PT, DPT 12/01/18, 2:30 PM  [facility] [address] [place], [address] Phone: [phone]   Fax:  [phone]  Name: KATHLEE BARNHARDT MRN: [id number] Date of Birth: 08-Nov-1945

## 2018-12-03 ENCOUNTER — Encounter: Payer: Self-pay | Admitting: Physical Therapy

## 2018-12-03 ENCOUNTER — Other Ambulatory Visit: Payer: Self-pay

## 2018-12-03 ENCOUNTER — Ambulatory Visit: Payer: Medicare Other | Admitting: Physical Therapy

## 2018-12-03 VITALS — BP 138/66

## 2018-12-03 DIAGNOSIS — R262 Difficulty in walking, not elsewhere classified: Secondary | ICD-10-CM | POA: Diagnosis not present

## 2018-12-03 DIAGNOSIS — G8929 Other chronic pain: Secondary | ICD-10-CM | POA: Diagnosis not present

## 2018-12-03 DIAGNOSIS — Z9181 History of falling: Secondary | ICD-10-CM

## 2018-12-03 DIAGNOSIS — M6281 Muscle weakness (generalized): Secondary | ICD-10-CM

## 2018-12-03 DIAGNOSIS — M545 Low back pain: Secondary | ICD-10-CM | POA: Diagnosis not present

## 2018-12-03 NOTE — Therapy (Signed)
[facility] [address] [place], [address] Phone: [phone]   Fax:  [phone]  Physical Therapy Treatment  Patient Details  Name: Andrea Holden MRN: [id number] Date of Birth: 11/18/1945 Referring Provider (PT): Fredderick Severance, NP    Encounter Date: 12/03/2018  PT End of Session - 12/03/18 1346    Visit Number  6    Number of Visits  24    Date for PT Re-Evaluation  02/09/19    Authorization Type  [facility] Medicare reporting period from 01/12/2019    Authorization Time Period  Current cert period 2/54/2706 - 02/03/19    Authorization - Visit Number  6    Authorization - Number of Visits  10    PT Start Time  1330    PT Stop Time  1415    PT Time Calculation (min)  45 min    Equipment Utilized During Treatment  Gait belt    Activity Tolerance  Patient tolerated treatment well;Patient limited by fatigue;Patient limited by pain    Behavior During Therapy  WFL for tasks assessed/performed       Past Medical History:  Diagnosis Date  . Allergy    seasonal allergies  . Anemia 2014   needed 5 units of blood d/t passing out, weak  . Anxiety   . Aortic stenosis 12/24/2017   Echo Aug 2018  . Arthritis   . Asthma    allergy induced asthma  . Cardiac murmur 12/12/2017  . Cataract   . Cataract    left  . Complication of anesthesia    arrhythmia following colonoscopy  . Degenerative disc disease, lumbar   . Degenerative disc disease, lumbar   . Depression   . Diabetes mellitus   . Diabetic neuropathy ([place]) 11/16/2015  . Dysrhythmia    stenosis of left ventricle  . GERD (gastroesophageal reflux disease)   . H/O transfusion    patient was given 5 units of blood while at [place], blood type O+  . History of chicken pox   . History of hiatal hernia   . History of measles, mumps, or rubella   . HOH (hard of hearing)    does not use hearing aides yet  . Hyperlipidemia   . Hypertension   .  Irregular heartbeat   . LVH (left ventricular hypertrophy) 12/24/2017   Echo Aug 2018  . Neuropathy   . Opiate use 11/16/2015  . Peripheral arterial disease ([place]) 05/06/2018   At rest, left > right; refer to vasc  . Pes planus of both feet 11/16/2015  . Plantar fasciitis of right foot 11/16/2015  . Wheezing     Past Surgical History:  Procedure Laterality Date  . ABDOMINAL HYSTERECTOMY    . APPLICATION OF WOUND VAC Left 07/23/2018   Procedure: APPLICATION OF WOUND VAC;  Surgeon: Algernon Huxley, MD;  Location: [facility] ORS;  Service: Vascular;  Laterality: Left;  . banck injections    . CATARACT EXTRACTION W/PHACO Right 05/30/2015   Procedure: CATARACT EXTRACTION PHACO AND INTRAOCULAR LENS PLACEMENT (IOC);  Surgeon: Birder Robson, MD;  Location: [facility] ORS;  Service: Ophthalmology;  Laterality: Right;  [place] 00:57 AP% 20.9 CDE 11.99 fluid pack lot #[id number]  . CHOLECYSTECTOMY  1970  . COLON SURGERY  2013   blocked colon  . COLONOSCOPY WITH PROPOFOL N/A 01/20/2018   Procedure: COLONOSCOPY WITH PROPOFOL;  Surgeon: Lucilla Lame, MD;  Location: [facility] ENDOSCOPY;  Service: Endoscopy;  Laterality: N/A;  .  ENDARTERECTOMY FEMORAL Left 07/08/2018   Procedure: ENDARTERECTOMY FEMORAL;  Surgeon: Algernon Huxley, MD;  Location: [facility] ORS;  Service: Vascular;  Laterality: Left;  . EYE SURGERY Right    cataract surgery  . FOOT FUSION Right 2018   metal in foot  . GALLBLADDER SURGERY  1970  . GASTRIC BYPASS  2010   lost 178 lbs and regained 40 lbs last few years  . HUMERUS FRACTURE SURGERY Right    metal plate with screws  . internal bleeding  2016   ulcer in past  . LOWER EXTREMITY ANGIOGRAM Left 07/08/2018   Procedure: LOWER EXTREMITY ANGIOGRAM;  Surgeon: Algernon Huxley, MD;  Location: [facility] ORS;  Service: Vascular;  Laterality: Left;  . LOWER EXTREMITY ANGIOGRAPHY Left 05/27/2018   Procedure: LOWER EXTREMITY ANGIOGRAPHY;  Surgeon: Algernon Huxley, MD;  Location: [facility];  Service: Cardiovascular;   Laterality: Left;  . MOUTH SURGERY     root canals and crowns and extractions  . OVARY SURGERY    . SKIN GRAFT Right 2018   RT foot. foot has been rebuilt.  it is full of metal  . TENOTOMY ACHILLES TENDON Right    Percuntaneous. metal in foot  . TONSILLECTOMY    . WOUND DEBRIDEMENT Left 07/23/2018   Procedure: DEBRIDEMENT WOUND;  Surgeon: Algernon Huxley, MD;  Location: [facility] ORS;  Service: Vascular;  Laterality: Left;    Vitals:   12/03/18 1335  BP: 138/66    Subjective Assessment - 12/03/18 1344    Subjective  Patient reports she is feeling very tired today. She is not sure why but thinks it may be the heat and humidity. She feels she might not be able to do too much. She reports some difficulty straightening up the last couple of days and usual back pain.  She state she felt pretty good last treatment session.     Pertinent History  Patient is a 73 y.o. female who presents to outpatient physical therapy with a referral for medical diagnosis of degeneration of lumbar intervertebral disc. This patient's chief complaints consist of chronic low back pain and foot pain, weakness, stiffness, leading to the following functional deficits: difficulty with with basic ADLs, IADLs, ambulation. She used to get injections to help control back pain but it has recently worsened due to being unable to get injections during [place] pandemic. The patient has been informed of current processes in place at [facility]patient [facility]hab to protect patients from Covid-19 exposure including social distancing, schedule modifications, and new cleaning procedures. After discussing their particular risk with a therapist based on the patient's personal risk factors, the patient has decided to proceed with in-person therapy. Pt is scheduled for R LE angiography on 12/07/2018. She has recently started smoking again with the stress of COVID19 pandemic and feels strongly she needs to stop. Surgeon has stated in the past he will not complete  surgery if she is smoking.  Relevant past medical history and comorbidities include underwent extensive vascular surgeries in Jan 2020 to improve blood flow, similar surgery for R leg scheduled 12/07/2018. Has a history of left carotid artery stenosis, but not enough to have surgery, has a history of multiple arterial surgeries, history of R cataract surgery, colon surgery, cholecystectomy, R foot skin graft, ovary surgery, gastric bypass, R foot fusion, abdominal hysterectomy; current smoker, plantar fasciitis, peripheral arterial disease, LVH, diabetes mellitus with neuropathy, irregular heartbeat, hiatal hernia, GERD, stenosis of left heart ventricle, depression, lumbar degenerative disc disease, cardiac murmur, asthma,  anxiety, obesity, HTN, and seasonal allergies She has been advised not to lift over 10 pounds by physician    Limitations  Sitting;Standing;Walking;House hold activities;Lifting    How long can you sit comfortably?  <1 hour    How long can you stand comfortably?  1 minute    How long can you walk comfortably?  2 minutes, depends on what she has with her. Able to walk at fair with rollator. To mailbox 120 feet causes shaking in R leg.      Diagnostic tests  MRI lumbar spine report 06/14/2017: "IMPRESSION: 1. Stable degenerative of lumbar spondylosis and scoliosis with multilevel disc disease and facet disease. 2. Stable right foraminal stenosis at L5-S1 due to right-sided disc    Patient Stated Goals  patient would like to get back to PLOF, walking further, be better able to participate in dog showing and trips    Pain Score  6     Pain Location  Back    Pain Type  Chronic pain    Pain Onset  More than a month ago           TREATMENT:   Therapeutic exercise:to centralize symptoms and improve ROM, strength, muscular endurance, and activity tolerance required for successful completion of functional activities. - blood pressure measurement manually measured at the wrist to  ensure safety of exercise. Pt requested it be taken manually because she feels this get a more accurate BP.  elevated but safe for exercise. (see above for values). -NuStep level1using bilateral upper and lower extremities. Seatsetting 8/handle setting7. For improved extremity mobility, muscular endurance, and activity tolerance; and to induce the analgesic effect of aerobic exercise, stimulate improved joint nutrition, and prepare body structures and systems for following interventions. x88mnuteswith SPM above 60.x 10 min during subjective exam. - sidelying hip abduction3x10each side. Required physical assistance and cuing to get in true sidelying. To improve single leg stance pelvis stability for ambulation. - supine bridges with red theraband around distal thighs,3x10 with 5# dumbbell over each hip,improved ability to get tofull ROM. To strengthen hip extension for functional exercise. Cuing for proper form. -Prone hip extension with knee flexed to preferentially bias gluteus maximus. 3x10 each side with 2# dumbbell behind knee Cuing to keep knee flexed.To improve functional strength.  - prone straight leg hip extension to abduction and back over half-circle bolster. 3x10 each way. To improve hip strength for ambulation. Cuing for proper motion. - Sit <> stand from low plinth with no UE support. 3x10 with supervision for safety. To improve LE strength, power, and endurance for functional mobility. Tired at end of each set.   - Education on HEP including handout    HOME EXERCISE PROGRAM Access Code: [email or web address] URL: [email or web address]  Date: 12/03/2018  Prepared by: SRosita Kea  Exercises  Sit to Stand without Arm Support - 30 reps - 2x daily  Standing Hip Hiking - 3 sets - 10 reps - 2x daily  Supine Bridge with Resistance Band - 3 sets - 10 reps - 1 second hold - 2x daily  Sidelying Hip Abduction - 3 sets - 10 reps - 2x daily  Prone Hip Extension with Bent  Knee - 3 sets - 10 reps - 2x daily  Prone Hip Abduction on Slider - 3 sets - 10 reps - 2x daily    PT Education - 12/03/18 1346    Education provided  Yes    Education Details  Exercise purpose/form. Self management techniques. Education on diagnosis,  prognosis, POC, anatomy and physiology of current condition    Person(s) Educated  Patient    Methods  Explanation;Demonstration;Tactile cues;Verbal cues    Comprehension  Verbalized understanding;Returned demonstration;Verbal cues required;Tactile cues required       PT Short Term Goals - 12/01/18 1429      PT SHORT TERM GOAL #1   Title  Be independent with initial home exercise program for self-management of symptoms.    Baseline  Initial HEP provided at IE (11/17/2018);    Time  2    Period  Weeks    Status  Partially Met    Target Date  12/01/18        PT Long Term Goals - 11/24/18 1426      PT LONG TERM GOAL #1   Title  Be independent with a long-term home exercise program for self-management of symptoms.     Baseline  Initial HEP provided at IE (11/17/2018);    Time  12    Period  Weeks    Status  New    Target Date  02/09/19      PT LONG TERM GOAL #2   Title  Patient will improve 6MWT to equal or greater than 1000 feet with LRAD to demonstrate improved activity tolerance for community ambulation.     Baseline  6 Minute Walk Test: 200 feet with SPC and supervision. Stopped at 1:43 due to fatigue and leg pain. (11/17/2018);    Time  12    Period  Weeks    Status  New    Target Date  02/09/19      PT LONG TERM GOAL #3   Title  Patient will demonstrate improved 5 Times Sit to Stand test to 11 seconds from chair height with no UE support to demonstrate improvement in LE power and functional strength for daily activities.    Baseline  5 Time Sit to Stand: 27 seconds from chair height no UE support (11/17/2018);     Time  12    Period  Weeks    Status  New    Target Date  02/09/19      PT LONG TERM GOAL #4   Title   Patient will ambulate faster than 1.66ms on the 10 Meter Walk Test to improve ability to cross street safey for community participation.     Baseline  testing deferred to next session (11/17/2018); 1.37 m/sec with SPC (11/24/2018);     Time  12    Period  Weeks    Status  Achieved    Target Date  02/09/19      PT LONG TERM GOAL #5   Title  Patient will score equal or greater than  25/30 on the Functional Gait Assessment to demonstrate low fall risk to improve her ability to participate safely in community activities.     Baseline  testing deferred to next session (11/17/2018); 11/30 high fall risk (11/24/2018);    Time  12    Period  Weeks    Status  On-going    Target Date  02/09/19            Plan - 12/03/18 1527    Clinical Impression Statement  Patient tolerated treatment well and is making appropriate progress towards goals at this point. Had some difficulty getting up following exercises on the mat due to L hip pain when rolling on left side and reported feeling of "sciatica" in the R glute following session but overall tolerated well.  Patient was on time today and able to complete more exercise with good tolerance. Educated on the normalcy of experiencing some discomfort with new exercises in the face of chronic pain and how this is not unexpected or a threat but that we want to monitor it for tolerability. Patient continues to report abstinence from smoking.  Pt required multimodal cuing for proper technique and to facilitate improved neuromuscular control, strength, range of motion, and functional ability resulting in improved performance and form. Patient would benefit from continued physical therapy to address remaining impairments and functional limitations to work towards stated goals and return to PLOF or maximal functional independence    Personal Factors and Comorbidities  Age;Comorbidity 3+;Fitness;Time since onset of injury/illness/exacerbation;Past/Current Experience;Other    significant anxiety affecting wellbeing and wellness behaviors   Comorbidities  underwent extensive vascular surgeries in Jan 2020 to improve blood flow, similar surgery for R leg scheduled 12/07/2018. Has a history of left carotid artery stenosis, but not enough to have surgery, has a history of multiple arterial surgeries, history of R cataract surgery, colon surgery, cholecystectomy, R foot skin graft, ovary surgery, gastric bypass, R foot fusion, abdominal hysterectomy; current smoker, plantar fasciitis, peripheral arterial disease, LVH, diabetes mellitus with neuropathy, irregular heartbeat, hiatal hernia, GERD, stenosis of left heart ventricle, depression, lumbar degenerative disc disease, cardiac murmur, asthma, anxiety, obesity, HTN, and seasonal allergies She has been advised not to lift over 10 pounds by physician.      Examination-Activity Limitations  Bathing;Carry;Lift;Sit;Stand;Locomotion Level;Toileting;Dressing;Squat;Transfers;Stairs;Hygiene/Grooming;Other   ADLs, IADLs, and valued community and social activities including dog showing, transferring, ambulating household and community distances, squatting, stooping, dog handling/training, and stairs   Examination-Participation Restrictions  Community Activity;Volunteer;Cleaning;Interpersonal Relationship;Yard Social research officer, government, participation in Chartered certified accountant   Stability/Clinical Decision Making  Evolving/Moderate complexity    Rehab Potential  Fair    Clinical Impairments Affecting Rehab Potential  (+) motivated (-) muliple co morbidities, chronic condition    PT Frequency  2x / week    PT Duration  12 weeks    PT Treatment/Interventions  Moist Heat;Patient/family education;Neuromuscular re-education;Therapeutic exercise;Manual techniques;ADLs/Self Care Home Management;Electrical Stimulation;Gait training;Stair training;Functional mobility training;Therapeutic activities;Balance training;Passive range of motion;Dry  needling;Energy conservation;Spinal Manipulations;Joint Manipulations;Cryotherapy   joint mobilizations grades I-IV   PT Next Visit Plan  continue with progressive functional strengtheing    PT Home Exercise Plan  Medbridge Access Code: [email or web address]    Consulted and Agree with Plan of Care  Patient       Patient will benefit from skilled therapeutic intervention in order to improve the following deficits and impairments:  Decreased strength, Impaired flexibility, Decreased activity tolerance, Impaired perceived functional ability, Pain, Decreased endurance, Difficulty walking, Abnormal gait, Decreased knowledge of use of DME, Decreased skin integrity, Decreased range of motion, Impaired sensation, Improper body mechanics, Obesity, Postural dysfunction, Increased edema, Decreased mobility, Decreased balance, Cardiopulmonary status limiting activity, Decreased coordination  Visit Diagnosis: Muscle weakness (generalized)  Chronic bilateral low back pain, unspecified whether sciatica present  Difficulty in walking, not elsewhere classified  History of falling     Problem List Patient Active Problem List   Diagnosis Date Noted  . Lower extremity edema 07/31/2018  . Pressure injury of skin 07/24/2018  . Surgical site infection 07/23/2018  . Wound of left leg 07/23/2018  . Atherosclerosis of artery of extremity with rest pain ([place]) 07/08/2018  . Shortness of breath 06/02/2018  . Bruit 06/02/2018  . Coronary artery disease of native artery of native heart with stable angina pectoris ([place])  06/02/2018  . Atherosclerosis of native arteries of extremity with intermittent claudication ([place]) 05/19/2018  . Peripheral arterial disease ([place]) 05/06/2018  . Personal history of colonic polyps   . Aortic stenosis 12/24/2017  . LVH (left ventricular hypertrophy) 12/24/2017  . Hypertension with heart disease 12/24/2017  . Left atrial dilatation 12/12/2017  . Cardiac murmur 12/12/2017  . Gastrojejunal  ulcer 12/04/2017  . Hyperphosphatemia 12/04/2017  . Spinal stenosis of lumbar region 06/18/2017  . Degeneration of lumbar intervertebral disc 06/02/2017  . Assistance needed with transportation 05/26/2017  . Financial difficulties 05/26/2017  . Needs assistance with community resources 05/26/2017  . Moderate recurrent major depression ([place]) 05/26/2017  . Non-healing wound of lower extremity 02/06/2017  . Status post ankle arthrodesis 12/11/2016  . Controlled substance agreement broken 10/11/2016  . Low back pain 09/25/2016  . Osteoarthritis of right subtalar joint 07/11/2016  . Posterior tibial tendinitis of right lower extremity 07/11/2016  . Breast cancer screening 06/18/2016  . Knee pain 04/03/2016  . Hyponatremia 03/01/2016  . High triglycerides 12/24/2015  . Pes planus of both feet 11/16/2015  . Plantar fasciitis of right foot 11/16/2015  . Heel spur 11/16/2015  . Diabetic neuropathy ([place]) 11/16/2015  . Right ankle pain 11/16/2015  . Medication monitoring encounter 11/16/2015  . Chronic pain of multiple joints 08/15/2015  . Abnormal mammogram of right breast 06/19/2015  . Cerumen impaction 03/16/2015  . Hyperlipidemia 12/14/2014  . History of transfusion of packed RBC 12/14/2014  . Hx of smoking 12/14/2014  . Status post bariatric surgery 12/14/2014  . Morbid obesity ([place]) 12/14/2014  . Chronic radicular lumbar pain 12/14/2014  . History of GI bleed 11/12/2014  . GERD without esophagitis 11/12/2014  . History of small bowel obstruction 09/07/2014    Everlean Alstrom. [name], PT, DPT 12/03/18, 3:28 PM  [facility] [address] [place], [address] Phone: [phone]   Fax:  [phone]  Name: Andrea Holden MRN: [id number] Date of Birth: 1946-02-11

## 2018-12-04 ENCOUNTER — Other Ambulatory Visit
Admission: RE | Admit: 2018-12-04 | Discharge: 2018-12-04 | Disposition: A | Discharge status: Home / Self Care | Payer: Medicare Other | Source: Ambulatory Visit | Attending: Vascular Surgery | Admitting: Vascular Surgery

## 2018-12-04 ENCOUNTER — Ambulatory Visit: Payer: Self-pay | Admitting: *Deleted

## 2018-12-04 ENCOUNTER — Telehealth (INDEPENDENT_AMBULATORY_CARE_PROVIDER_SITE_OTHER): Payer: Self-pay

## 2018-12-04 DIAGNOSIS — Z1159 Encounter for screening for other viral diseases: Secondary | ICD-10-CM | POA: Insufficient documentation

## 2018-12-04 DIAGNOSIS — Z01812 Encounter for preprocedural laboratory examination: Secondary | ICD-10-CM | POA: Diagnosis not present

## 2018-12-04 DIAGNOSIS — Z598 Other problems related to housing and economic circumstances: Secondary | ICD-10-CM

## 2018-12-04 DIAGNOSIS — Z599 Problem related to housing and economic circumstances, unspecified: Secondary | ICD-10-CM

## 2018-12-04 DIAGNOSIS — Z789 Other specified health status: Secondary | ICD-10-CM

## 2018-12-04 NOTE — Telephone Encounter (Signed)
Spoke with the patient and she is aware she needs to have Covid testing today before her procedure with Dew on Monday 12/07/2018.

## 2018-12-04 NOTE — Patient Instructions (Signed)
Thank you allowing the Chronic Care Management Team to be a part of your care! It was a pleasure speaking with you today!   1. Please call this social worker with any additional questions in regards to your community resource needs     CCM (Chronic Care Management) Team   Yvone Neu RN, BSN Nurse Care Coordinator  [phone]  Karalee Height PharmD  Clinical Pharmacist  [phone]   Verna Czech, LCSW Clinical Social Worker [phone]  Goals Addressed            This Visit's Progress   . I need help applying for food stamps (pt-stated)       Current Barriers:  . Financial constraints . Limited social support . Family and relationship dysfunction . Social Isolation  Clinical Social Work Clinical Goal(s):  Marland Kitchen Over the next 30 days, client will work with SW to address concerns related to applying for food stamps  Interventions: . Patient interviewed and appropriate assessments performed . Confirmed that patient has contacted the Department of Social Services and it has been determined that she has been approved for food stamps as of 12/02/2018 and her card has been mailed to her home. . Patient confirmed that she has been tested for COVID today and is now quarantined until she receives the results in preparation for her surgery on Monday to place stints in her right leg. Marland Kitchen Positive reinforcement provided in response to patient's confirmation that she continues . Per patient, she has not smoked a cigarette in 2 weeks and 2 days. Patient able to discuss the benefits experienced since she has stopped smoking. . Discussed plans with patient for ongoing care management follow up and provided patient with direct contact information for care management team   Patient Self Care Activities:  . Self administers medications as prescribed . Attends all scheduled provider appointments . Performs ADL's independently . Performs IADL's independently  Please see past updates  related to this goal by clicking on the "Past Updates" button in the selected goal          The patient verbalized understanding of instructions provided today and declined a print copy of patient instruction materials.   The care management team will reach out to the patient again over the next 14 days.

## 2018-12-04 NOTE — Chronic Care Management (AMB) (Signed)
  Chronic Care Management    Clinical Social Work Follow Up Note     12/04/2018 Name: Andrea Holden MRN: [id number] DOB: 09/27/1945  Andrea Holden is a 73 y.o. year old female who is a primary care patient of Lada, Janit Bern, MD. The CCM team was consulted for assistance with applying for food stamps.  Review of patient status, including review of consultants reports, other relevant assessments, and collaboration with appropriate care team members and the patient's provider was performed as part of comprehensive patient evaluation and provision of chronic care management services.     Goals Addressed            This Visit's Progress   . I need help applying for food stamps (pt-stated)       Current Barriers:  . Financial constraints . Limited social support . Family and relationship dysfunction . Social Isolation  Clinical Social Work Clinical Goal(s):  Marland Kitchen Over the next 30 days, client will work with SW to address concerns related to applying for food stamps  Interventions: . Patient interviewed and appropriate assessments performed . Confirmed that patient has contacted the Department of Social Services and it has been determined that she has been approved for food stamps as of 12/02/2018 and her card has been mailed to her home. . Patient confirmed that she has been tested for COVID today and is now quarantined until she receives the results in preparation for her surgery on Monday to place stints in her right leg. Marland Kitchen Positive reinforcement provided in response to patient's confirmation that she continues . Per patient, she has not smoked a cigarette in 2 weeks and 2 days. Patient able to discuss the benefits experienced since she has stopped smoking. . Discussed plans with patient for ongoing care management follow up and provided patient with direct contact information for care management team   Patient Self Care Activities:  . Self administers medications as prescribed  . Attends all scheduled provider appointments . Performs ADL's independently . Performs IADL's independently  Please see past updates related to this goal by clicking on the "Past Updates" button in the selected goal          Follow Up Plan: SW will follow up with patient by phone over the next 2 weeks    Kaliya Shreiner, LCSW Clinical Social Worker  [facility] Care Management [phone]

## 2018-12-05 ENCOUNTER — Other Ambulatory Visit: Payer: Self-pay | Admitting: Nurse Practitioner

## 2018-12-05 DIAGNOSIS — K219 Gastro-esophageal reflux disease without esophagitis: Secondary | ICD-10-CM

## 2018-12-05 LAB — NOVEL CORONAVIRUS, NAA (HOSP ORDER, SEND-OUT TO REF LAB; TAT 18-24 HRS): SARS-CoV-2, NAA: NOT DETECTED

## 2018-12-06 MED ORDER — CEFAZOLIN SODIUM-DEXTROSE 2-4 GM/100ML-% IV SOLN
2.0000 g | Freq: Once | INTRAVENOUS | Status: AC
  Administered 2018-12-07: 2 g via INTRAVENOUS

## 2018-12-07 ENCOUNTER — Ambulatory Visit
Admission: RE | Admit: 2018-12-07 | Discharge: 2018-12-07 | Disposition: A | Discharge status: Home / Self Care | Payer: Medicare Other | Attending: Vascular Surgery | Admitting: Vascular Surgery

## 2018-12-07 ENCOUNTER — Encounter: Payer: Self-pay | Admitting: Registered Nurse

## 2018-12-07 ENCOUNTER — Encounter
Admission: RE | Disposition: A | Discharge status: Home / Self Care | Payer: Self-pay | Source: Home / Self Care | Attending: Vascular Surgery

## 2018-12-07 ENCOUNTER — Other Ambulatory Visit: Payer: Self-pay

## 2018-12-07 ENCOUNTER — Other Ambulatory Visit (INDEPENDENT_AMBULATORY_CARE_PROVIDER_SITE_OTHER): Payer: Self-pay | Admitting: Nurse Practitioner

## 2018-12-07 DIAGNOSIS — I350 Nonrheumatic aortic (valve) stenosis: Secondary | ICD-10-CM | POA: Insufficient documentation

## 2018-12-07 DIAGNOSIS — J45909 Unspecified asthma, uncomplicated: Secondary | ICD-10-CM | POA: Diagnosis not present

## 2018-12-07 DIAGNOSIS — H9190 Unspecified hearing loss, unspecified ear: Secondary | ICD-10-CM | POA: Insufficient documentation

## 2018-12-07 DIAGNOSIS — K219 Gastro-esophageal reflux disease without esophagitis: Secondary | ICD-10-CM | POA: Diagnosis not present

## 2018-12-07 DIAGNOSIS — Z886 Allergy status to analgesic agent status: Secondary | ICD-10-CM | POA: Diagnosis not present

## 2018-12-07 DIAGNOSIS — E1151 Type 2 diabetes mellitus with diabetic peripheral angiopathy without gangrene: Secondary | ICD-10-CM

## 2018-12-07 DIAGNOSIS — Z90710 Acquired absence of both cervix and uterus: Secondary | ICD-10-CM | POA: Diagnosis not present

## 2018-12-07 DIAGNOSIS — I70221 Atherosclerosis of native arteries of extremities with rest pain, right leg: Secondary | ICD-10-CM | POA: Insufficient documentation

## 2018-12-07 DIAGNOSIS — Z821 Family history of blindness and visual loss: Secondary | ICD-10-CM | POA: Diagnosis not present

## 2018-12-07 DIAGNOSIS — E785 Hyperlipidemia, unspecified: Secondary | ICD-10-CM | POA: Insufficient documentation

## 2018-12-07 DIAGNOSIS — Z79899 Other long term (current) drug therapy: Secondary | ICD-10-CM | POA: Diagnosis not present

## 2018-12-07 DIAGNOSIS — H269 Unspecified cataract: Secondary | ICD-10-CM | POA: Insufficient documentation

## 2018-12-07 DIAGNOSIS — Z833 Family history of diabetes mellitus: Secondary | ICD-10-CM | POA: Insufficient documentation

## 2018-12-07 DIAGNOSIS — E1140 Type 2 diabetes mellitus with diabetic neuropathy, unspecified: Secondary | ICD-10-CM | POA: Diagnosis not present

## 2018-12-07 DIAGNOSIS — Z87891 Personal history of nicotine dependence: Secondary | ICD-10-CM | POA: Insufficient documentation

## 2018-12-07 DIAGNOSIS — Z8249 Family history of ischemic heart disease and other diseases of the circulatory system: Secondary | ICD-10-CM | POA: Insufficient documentation

## 2018-12-07 DIAGNOSIS — Z825 Family history of asthma and other chronic lower respiratory diseases: Secondary | ICD-10-CM | POA: Diagnosis not present

## 2018-12-07 DIAGNOSIS — Z9884 Bariatric surgery status: Secondary | ICD-10-CM | POA: Diagnosis not present

## 2018-12-07 DIAGNOSIS — I70211 Atherosclerosis of native arteries of extremities with intermittent claudication, right leg: Secondary | ICD-10-CM

## 2018-12-07 DIAGNOSIS — Z823 Family history of stroke: Secondary | ICD-10-CM | POA: Insufficient documentation

## 2018-12-07 DIAGNOSIS — I10 Essential (primary) hypertension: Secondary | ICD-10-CM | POA: Diagnosis not present

## 2018-12-07 DIAGNOSIS — F17211 Nicotine dependence, cigarettes, in remission: Secondary | ICD-10-CM | POA: Diagnosis not present

## 2018-12-07 DIAGNOSIS — I70219 Atherosclerosis of native arteries of extremities with intermittent claudication, unspecified extremity: Secondary | ICD-10-CM

## 2018-12-07 DIAGNOSIS — Z888 Allergy status to other drugs, medicaments and biological substances status: Secondary | ICD-10-CM | POA: Diagnosis not present

## 2018-12-07 HISTORY — PX: LOWER EXTREMITY ANGIOGRAPHY: CATH118251

## 2018-12-07 LAB — CREATININE, SERUM
Creatinine, Ser: 0.94 mg/dL (ref 0.44–1.00)
GFR calc Af Amer: >60 mL/min (ref >60)
GFR calc non Af Amer: >60 mL/min (ref >60)

## 2018-12-07 LAB — BUN: BUN: 25 mg/dL — ABNORMAL HIGH (ref 8–23)

## 2018-12-07 SURGERY — LOWER EXTREMITY ANGIOGRAPHY
Anesthesia: Moderate Sedation | Site: Leg Lower | Laterality: Right

## 2018-12-07 MED ORDER — MIDAZOLAM HCL 2 MG/ML PO SYRP: 8.0000 mg | ORAL_SOLUTION | Freq: Once | ORAL | Status: DC | PRN

## 2018-12-07 MED ORDER — ACETAMINOPHEN 325 MG PO TABS: 650.0000 mg | ORAL_TABLET | ORAL | Status: DC | PRN

## 2018-12-07 MED ORDER — ONDANSETRON HCL 4 MG/2ML IJ SOLN: 4.0000 mg | Freq: Four times a day (QID) | INTRAMUSCULAR | Status: DC | PRN

## 2018-12-07 MED ORDER — SODIUM CHLORIDE 0.9 % IV SOLN: 250.0000 mL | INTRAVENOUS | Status: DC | PRN

## 2018-12-07 MED ORDER — FAMOTIDINE 20 MG PO TABS: 40.0000 mg | ORAL_TABLET | Freq: Once | ORAL | Status: DC | PRN

## 2018-12-07 MED ORDER — FENTANYL CITRATE (PF) 100 MCG/2ML IJ SOLN
INTRAMUSCULAR | Status: DC | PRN
  Administered 2018-12-07: 50 ug via INTRAVENOUS
  Administered 2018-12-07: 25 ug via INTRAVENOUS

## 2018-12-07 MED ORDER — FENTANYL CITRATE (PF) 100 MCG/2ML IJ SOLN
INTRAMUSCULAR | Status: AC
  Filled 2018-12-07: qty 2

## 2018-12-07 MED ORDER — MIDAZOLAM HCL 5 MG/5ML IJ SOLN
INTRAMUSCULAR | Status: AC
  Filled 2018-12-07: qty 5

## 2018-12-07 MED ORDER — HYDRALAZINE HCL 20 MG/ML IJ SOLN: 5.0000 mg | INTRAMUSCULAR | Status: DC | PRN

## 2018-12-07 MED ORDER — HEPARIN SODIUM (PORCINE) 1000 UNIT/ML IJ SOLN
INTRAMUSCULAR | Status: AC
  Filled 2018-12-07: qty 1

## 2018-12-07 MED ORDER — SODIUM CHLORIDE 0.9 % IV SOLN
INTRAVENOUS | Status: DC
  Administered 2018-12-07: 1000 mL via INTRAVENOUS

## 2018-12-07 MED ORDER — LIDOCAINE-EPINEPHRINE (PF) 1 %-1:200000 IJ SOLN
INTRAMUSCULAR | Status: AC
  Filled 2018-12-07: qty 30

## 2018-12-07 MED ORDER — MIDAZOLAM HCL 2 MG/2ML IJ SOLN
INTRAMUSCULAR | Status: DC | PRN
  Administered 2018-12-07: 1 mg via INTRAVENOUS
  Administered 2018-12-07: 2 mg via INTRAVENOUS

## 2018-12-07 MED ORDER — METHYLPREDNISOLONE SODIUM SUCC 125 MG IJ SOLR: 125.0000 mg | Freq: Once | INTRAMUSCULAR | Status: DC | PRN

## 2018-12-07 MED ORDER — IOHEXOL 300 MG/ML  SOLN
INTRAMUSCULAR | Status: DC | PRN
  Administered 2018-12-07: 75 mL via INTRA_ARTERIAL

## 2018-12-07 MED ORDER — SODIUM CHLORIDE 0.9% FLUSH: 3.0000 mL | INTRAVENOUS | Status: DC | PRN

## 2018-12-07 MED ORDER — CEFAZOLIN SODIUM-DEXTROSE 2-4 GM/100ML-% IV SOLN
INTRAVENOUS | Status: AC
  Administered 2018-12-07: 2 g via INTRAVENOUS
  Filled 2018-12-07: qty 100

## 2018-12-07 MED ORDER — SODIUM CHLORIDE 0.9 % IV SOLN: INTRAVENOUS | Status: DC

## 2018-12-07 MED ORDER — DIPHENHYDRAMINE HCL 50 MG/ML IJ SOLN: 50.0000 mg | Freq: Once | INTRAMUSCULAR | Status: DC | PRN

## 2018-12-07 MED ORDER — SODIUM CHLORIDE 0.9% FLUSH: 3.0000 mL | Freq: Two times a day (BID) | INTRAVENOUS | Status: DC

## 2018-12-07 MED ORDER — LABETALOL HCL 5 MG/ML IV SOLN: 10.0000 mg | INTRAVENOUS | Status: DC | PRN

## 2018-12-07 MED ORDER — HYDROMORPHONE HCL 1 MG/ML IJ SOLN: 1.0000 mg | Freq: Once | INTRAMUSCULAR | Status: DC | PRN

## 2018-12-07 MED ORDER — HEPARIN SODIUM (PORCINE) 1000 UNIT/ML IJ SOLN
INTRAMUSCULAR | Status: DC | PRN
  Administered 2018-12-07: 5000 [IU] via INTRAVENOUS

## 2018-12-07 SURGICAL SUPPLY — 28 items
BALLN LUTONIX 018 5X300X130 (BALLOONS) ×3
BALLN ULTRVRSE 3X100X150 (BALLOONS) ×3
BALLN ULTRVRSE 5X300X150 (BALLOONS) ×3
BALLON DORADO 5X100X135 (BALLOONS) ×3
BALLOON DORADO 5X100X135 (BALLOONS) ×1 IMPLANT
BALLOON LUTONIX 018 5X300X130 (BALLOONS) ×1 IMPLANT
BALLOON ULTRVRSE 3X100X150 (BALLOONS) ×1 IMPLANT
BALLOON ULTRVRSE 5X300X150 (BALLOONS) ×1 IMPLANT
CANNULA 5F STIFF (CANNULA) ×3 IMPLANT
CATH BEACON 5 .038 100 VERT TP (CATHETERS) ×3 IMPLANT
CATH CXI SUPP ANG 4FR 135 (CATHETERS) ×1 IMPLANT
CATH CXI SUPP ANG 4FR 135CM (CATHETERS) ×3
CATH PIG 70CM (CATHETERS) ×3 IMPLANT
DEVICE PRESTO INFLATION (MISCELLANEOUS) ×3 IMPLANT
DEVICE STARCLOSE SE CLOSURE (Vascular Products) ×3 IMPLANT
GLIDEWIRE ADV .035X260CM (WIRE) ×3 IMPLANT
GUIDEWIRE AMPLATZ SHORT (WIRE) ×3 IMPLANT
GUIDEWIRE SUPER STIFF .035X180 (WIRE) ×3 IMPLANT
NEEDLE ENTRY 21GA 7CM ECHOTIP (NEEDLE) ×3 IMPLANT
PACK ANGIOGRAPHY (CUSTOM PROCEDURE TRAY) ×3 IMPLANT
SET INTRO CAPELLA COAXIAL (SET/KITS/TRAYS/PACK) ×3 IMPLANT
SHEATH ANL2 6FRX45 HC (SHEATH) ×3 IMPLANT
SHEATH BRITE TIP 5FRX11 (SHEATH) ×6 IMPLANT
STENT VIABAHN 6X150X120 (Permanent Stent) ×3 IMPLANT
SYR MEDRAD MARK 7 150ML (SYRINGE) ×3 IMPLANT
TUBING CONTRAST HIGH PRESS 72 (TUBING) ×3 IMPLANT
WIRE G V18X300CM (WIRE) ×3 IMPLANT
WIRE J 3MM .035X145CM (WIRE) ×3 IMPLANT

## 2018-12-07 NOTE — H&P (Signed)
[facility] Admission History & Physical  MRN : [id number]  Andrea MangleDianna J Holden is a 73 y.o. ([birth year]) female who presents with chief complaint of No chief complaint on file. Marland Kitchen.  History of Present Illness: Patient presents today for right leg angiogram.  Has symptoms of disabling claudication and some pain at rest.  Has known severe disease but has had to wait until her left groin incision was healed to treat the right leg.  Underwent treatment for her left sided disease with femoral endarterectomy several months ago.  Left leg now doing much better.  No fevers or chills.  No chest pain or shortness of breath.  Current Facility-Administered Medications  Medication Dose Route Frequency Provider Last Rate Last Dose  . 0.9 %  sodium chloride infusion   Intravenous Continuous Georgiana SpinnerBrown, Fallon E, NP 75 mL/hr at 12/07/18 0738 1,000 mL at 12/07/18 0738  . ceFAZolin (ANCEF) 2-4 GM/100ML-% IVPB           . ceFAZolin (ANCEF) IVPB 2g/100 mL premix  2 g Intravenous Once Sheppard PlumberBrown, Fallon E, NP      . diphenhydrAMINE (BENADRYL) injection 50 mg  50 mg Intravenous Once PRN Georgiana SpinnerBrown, Fallon E, NP      . famotidine (PEPCID) tablet 40 mg  40 mg Oral Once PRN Georgiana SpinnerBrown, Fallon E, NP      . fentaNYL (SUBLIMAZE) 100 MCG/2ML injection           . heparin 1000 UNIT/ML injection           . HYDROmorphone (DILAUDID) injection 1 mg  1 mg Intravenous Once PRN Georgiana SpinnerBrown, Fallon E, NP      . lidocaine-EPINEPHrine (XYLOCAINE-EPINEPHrine) 1 %-1:200000 (PF) injection           . methylPREDNISolone sodium succinate (SOLU-MEDROL) 125 mg/2 mL injection 125 mg  125 mg Intravenous Once PRN Sheppard PlumberBrown, Fallon E, NP      . midazolam (VERSED) 2 MG/ML syrup 8 mg  8 mg Oral Once PRN Georgiana SpinnerBrown, Fallon E, NP      . midazolam (VERSED) 5 MG/5ML injection           . ondansetron (ZOFRAN) injection 4 mg  4 mg Intravenous Q6H PRN Georgiana SpinnerBrown, Fallon E, NP        Past Medical History:  Diagnosis Date  . Allergy    seasonal allergies  .  Anemia 2014   needed 5 units of blood d/t passing out, weak  . Anxiety   . Aortic stenosis 12/24/2017   Echo Aug 2018  . Arthritis   . Asthma    allergy induced asthma  . Cardiac murmur 12/12/2017  . Cataract   . Cataract    left  . Complication of anesthesia    arrhythmia following colonoscopy  . Degenerative disc disease, lumbar   . Degenerative disc disease, lumbar   . Depression   . Diabetes mellitus   . Diabetic neuropathy (HCC) 11/16/2015  . Dysrhythmia    stenosis of left ventricle  . GERD (gastroesophageal reflux disease)   . H/O transfusion    [facility] blood type O+  . History of chicken pox   . History of hiatal hernia   . History of measles, mumps, or rubella   . HOH (hard of hearing)    does not use hearing aides yet  . Hyperlipidemia   . Hypertension   . Irregular heartbeat   . LVH (left ventricular hypertrophy)  12/24/2017   Echo Aug 2018  . Neuropathy   . Opiate use 11/16/2015  . Peripheral arterial disease (HCC) 05/06/2018   At rest, left > right; refer to vasc  . Pes planus of both feet 11/16/2015  . Plantar fasciitis of right foot 11/16/2015  . Wheezing     Past Surgical History:  Procedure Laterality Date  . ABDOMINAL HYSTERECTOMY    . APPLICATION OF WOUND VAC Left 07/23/2018   Procedure: APPLICATION OF WOUND VAC;  Surgeon: Annice Needyew, Masaichi Kracht S, MD;  Location: [facility] ORS;  Service: Vascular;  Laterality: Left;  . banck injections    . CATARACT EXTRACTION W/PHACO Right 05/30/2015   Procedure: CATARACT EXTRACTION PHACO AND INTRAOCULAR LENS PLACEMENT (IOC);  Surgeon: Galen ManilaWilliam Porfilio, MD;  Location: [facility] ORS;  Service: Ophthalmology;  Laterality: Right;  US 00:57 AP% 20.9 CDE 11.99 fluid pack lot #[id number]  . CHOLECYSTECTOMY  1970  . COLON SURGERY  2013   blocked colon  . COLONOSCOPY WITH PROPOFOL N/A 01/20/2018   Procedure: COLONOSCOPY WITH PROPOFOL;  Surgeon: Midge MiniumWohl, Darren, MD;  [facility];  Service:  Endoscopy;  Laterality: N/A;  . ENDARTERECTOMY FEMORAL Left 07/08/2018   Procedure: ENDARTERECTOMY FEMORAL;  Surgeon: Annice Needyew, Tranise Forrest S, MD;  Location: [facility] ORS;  Service: Vascular;  Laterality: Left;  . EYE SURGERY Right    cataract surgery  . FOOT FUSION Right 2018   metal in foot  . GALLBLADDER SURGERY  1970  . GASTRIC BYPASS  2010   lost 178 lbs and regained 40 lbs last few years  . HUMERUS FRACTURE SURGERY Right    metal plate with screws  . internal bleeding  2016   ulcer in past  . LOWER EXTREMITY ANGIOGRAM Left 07/08/2018   Procedure: LOWER EXTREMITY ANGIOGRAM;  Surgeon: Annice Needyew, Giannina Bartolome S, MD;  Location: [facility] ORS;  Service: Vascular;  Laterality: Left;  . LOWER EXTREMITY ANGIOGRAPHY Left 05/27/2018   Procedure: LOWER EXTREMITY ANGIOGRAPHY;  Surgeon: Annice Needyew, Orest Dygert S, MD;  Location: [facility] INVASIVE CV LAB;  Service: Cardiovascular;  Laterality: Left;  . MOUTH SURGERY     root canals and crowns and extractions  . OVARY SURGERY    . SKIN GRAFT Right 2018   RT foot. foot has been rebuilt.  it is full of metal  . TENOTOMY ACHILLES TENDON Right    Percuntaneous. metal in foot  . TONSILLECTOMY    . WOUND DEBRIDEMENT Left 07/23/2018   Procedure: DEBRIDEMENT WOUND;  Surgeon: Annice Needyew, Drena Ham S, MD;  Location: [facility] ORS;  Service: Vascular;  Laterality: Left;    Social History Social History   Tobacco Use  . Smoking status: Former Smoker    Packs/day: 1.00    Years: 55.00    Pack years: 55.00    Types: Cigarettes    Start date: 07/01/1960    Last attempt to quit: 11/22/2018    Years since quitting: 0.0  . Smokeless tobacco: Never Used  . Tobacco comment: still using chantix  Substance Use Topics  . Alcohol use: Yes    Alcohol/week: 0.0 standard drinks    Comment: 2 drinks per month  . Drug use: Not Currently    Types: Marijuana    Comment: used for pain    Family History Family History  Problem Relation Age of Onset  . Asthma Mother   . COPD Mother   . Arthritis Father   . Depression Father    . Heart disease Father   . Hypertension Father   . Stroke Father   .  Heart attack Father   . Arthritis Brother   . Depression Brother   . Diabetes Brother   . Heart disease Brother   . Hyperlipidemia Brother   . Hypertension Brother   . Stroke Brother   . Vision loss Brother   . Heart attack Brother   . Diabetes Maternal Grandmother   . Thyroid disease Daughter   . Colitis Daughter   . Breast cancer Neg Hx     Allergies  Allergen Reactions  . Meloxicam Other (See Comments)    GI bleeding  . Nsaids Other (See Comments)    Gi bleeding  . Sitagliptin Hives and Itching    Januvia      REVIEW OF SYSTEMS (Negative unless checked)  Constitutional: [] Weight loss  [] Fever  [] Chills Cardiac: [] Chest pain   [] Chest pressure   [x] Palpitations   [] Shortness of breath when laying flat   [] Shortness of breath at rest   [] Shortness of breath with exertion. Vascular:  [x] Pain in legs with walking   [] Pain in legs at rest   [] Pain in legs when laying flat   [] Claudication   [] Pain in feet when walking  [x] Pain in feet at rest  [] Pain in feet when laying flat   [x] History of DVT   [] Phlebitis   [x] Swelling in legs   [] Varicose veins   [] Non-healing ulcers Pulmonary:   [] Uses home oxygen   [] Productive cough   [] Hemoptysis   [] Wheeze  [] COPD   [] Asthma Neurologic:  [] Dizziness  [] Blackouts   [] Seizures   [] History of stroke   [] History of TIA  [] Aphasia   [] Temporary blindness   [] Dysphagia   [] Weakness or numbness in arms   [] Weakness or numbness in legs Musculoskeletal:  [x] Arthritis   [] Joint swelling   [x] Joint pain   [] Low back pain Hematologic:  [x] Easy bruising  [] Easy bleeding   [] Hypercoagulable state   [] Anemic  [] Hepatitis Gastrointestinal:  [] Blood in stool   [] Vomiting blood  [x] Gastroesophageal reflux/heartburn   [] Difficulty swallowing. Genitourinary:  [] Chronic kidney disease   [] Difficult urination  [] Frequent urination  [] Burning with urination   [] Blood in urine Skin:   [] Rashes   [] Ulcers   [] Wounds Psychological:  [x] History of anxiety   []  History of major depression.  Physical Examination  Vitals:   12/07/18 0726  BP: (!) 164/53  Pulse: 71  Resp: 14  Temp: 97.9 F (36.6 C)  TempSrc: Oral  SpO2: 96%  Weight: 90.3 kg  Height: 5\' 1"  (1.549 m)   Body mass index is 37.6 kg/m. Gen: WD/WN, NAD Head: [place]/AT, No temporalis wasting.  Ear/Nose/Throat: Hearing grossly intact, nares w/o erythema or drainage, oropharynx w/o Erythema/Exudate,  Eyes: Conjunctiva clear, sclera non-icteric Neck: Trachea midline.  No JVD.  Pulmonary:  Good air movement, respirations not labored, no use of accessory muscles.  Cardiac: RRR, normal S1, S2. Vascular:  Vessel Right Left  Radial Palpable Palpable                          PT Not Palpable 1+ Palpable  DP 1+ Palpable 1+ Palpable    Musculoskeletal: M/S 5/5 throughout.  Extremities without ischemic changes.  No deformity or atrophy.  Neurologic: Sensation grossly intact in extremities.  Symmetrical.  Speech is fluent. Motor exam as listed above. Psychiatric: Judgment intact, Mood & affect appropriate for pt's clinical situation. Dermatologic: No rashes or ulcers noted.  Left femoral endarterectomy wound well-healed      CBC Lab Results  Component  Value Date   WBC 11.3 (H) 07/24/2018   HGB 10.1 (L) 07/24/2018   HCT 32.0 (L) 07/24/2018   MCV 86.5 07/24/2018   PLT 289 07/24/2018    BMET    Component Value Date/Time   NA 135 10/09/2018 1353   NA 133 (L) 03/07/2016 1317   NA 135 10/12/2014 0516   K 5.0 10/09/2018 1353   K 4.3 10/12/2014 0516   CL 102 10/09/2018 1353   CL 109 10/12/2014 0516   CO2 25 10/09/2018 1353   CO2 24 10/12/2014 0516   GLUCOSE 107 (H) 10/09/2018 1353   GLUCOSE 146 (H) 10/12/2014 0516   BUN 25 (H) 12/07/2018 0723   BUN 18 03/07/2016 1317   BUN 39 (H) 10/12/2014 0516   CREATININE 0.94 12/07/2018 0723   CREATININE 0.85 04/23/2018 1204   CALCIUM 8.9 10/09/2018 1353    CALCIUM 7.6 (L) 10/12/2014 0516   GFRNONAA >60 12/07/2018 0723   GFRNONAA 69 04/23/2018 1204   GFRAA >60 12/07/2018 0723   GFRAA 80 04/23/2018 1204   Estimated Creatinine Clearance: 56.2 mL/min (by C-G formula based on SCr of 0.94 mg/dL).  COAG Lab Results  Component Value Date   INR 1.03 07/23/2018   INR 0.91 06/29/2018    Radiology No results found.    Assessment/Plan 1.  PAD with claudication/early rest pain.  Has already undergone extensive left lower extremity revascularization including femoral endarterectomy.  For right lower extremity revascularization today.  Risks and benefits discussed. 2.  Diabetes mellitus.  Stable on outpatient medications and blood glucose control important in reducing the progression of atherosclerotic disease. Also, involved in wound healing. On appropriate medications. 3.  Hypertension.  Stable on outpatient medications and blood pressure control important in reducing the progression of atherosclerotic disease. On appropriate oral medications.    Festus BarrenJason Harvis Mabus, MD  12/07/2018 8:05 AM

## 2018-12-07 NOTE — Discharge Instructions (Signed)
° °  [facility] [place] ° °Discharge Instructions ° °Lower Extremity Angiogram; Angioplasty/Stenting ° °Please refer to the following instructions for your post-procedure care. Your surgeon or physician assistant will discuss any changes with you. ° °Activity ° °Avoid lifting more than 8 pounds (1 gallons of milk) for 72 hours (3 days) after your procedure. You may walk as much as you can tolerate. It's OK to drive after 72 hours. ° °Bathing/Showering ° °You may shower the day after your procedure. If you have a bandage, you may remove it at 24- 48 hours. Clean your incision site with mild soap and water. Pat the area dry with a clean towel. ° °Diet ° °Resume your pre-procedure diet. There are no special food restrictions following this procedure. All patients with peripheral vascular disease should follow a low fat/low cholesterol diet. In order to heal from your surgery, it is CRITICAL to get adequate nutrition. Your body requires vitamins, minerals, and protein. Vegetables are the best source of vitamins and minerals. Vegetables also provide the perfect balance of protein. Processed food has little nutritional value, so try to avoid this. ° °Medications ° °Resume taking all of your medications unless your doctor tells you not to. If your incision is causing pain, you may take over-the-counter pain relievers such as acetaminophen (Tylenol) ° °Follow Up ° °Follow up will be arranged at the time of your procedure. You may have an office visit scheduled or may be scheduled for surgery. Ask your surgeon if you have any questions. ° °Please call us immediately for any of the following conditions: °•Severe or worsening pain your legs or feet at rest or with walking. °•Increased pain, redness, drainage at your groin puncture site. °•Fever of 101 degrees or higher. °•If you have any mild or slow bleeding from your puncture site: lie down, apply firm constant pressure over the area with a piece of  gauze or a clean wash cloth for 30 minutes- no peeking!, call 911 right away if you are still bleeding after 30 minutes, or if the bleeding is heavy and unmanageable. ° °Reduce your risk factors of vascular disease: ° °Stop smoking. If you would like help call [facility] at 1-800-QUIT-NOW (1-800-784-8669) or [facility] at [phone]. °Manage your cholesterol °Maintain a desired weight °Control your diabetes °Keep your blood pressure down ° °If you have any questions, please call the office at [phone] ° °

## 2018-12-07 NOTE — Op Note (Signed)
[facility]  Percutaneous Study/Intervention Procedural Note   Date of Surgery: 12/07/2018  Surgeon(s):DEW,JASON    Assistants:none  Pre-operative Diagnosis: PAD with claudication/rest pain right leg  Post-operative diagnosis:  Same  Procedure(s) Performed:             1.  Ultrasound guidance for vascular access left femoral artery             2.  Catheter placement into right common femoral artery from left femoral approach             3.  Aortogram and selective right lower extremity angiogram             4.  Percutaneous transluminal angioplasty of right tibioperoneal trunk and proximal peroneal artery with 3 mm diameter angioplasty balloon             5.   Percutaneous transluminal angioplasty of the right SFA and proximal popliteal artery with 5 mm diameter by 30 cm length Lutonix drug-coated angioplasty balloon  6.  Viabahn stent placement to the mid to distal SFA with a 6 mm diameter by 15 cm length stent for 2 areas of greater than 50% residual stenosis in this location             7.  StarClose closure device left femoral artery  EBL: 5 cc  Contrast: 75 cc  Fluoro Time: 10.9 minutes  Moderate Conscious Sedation Time: approximately 35 minutes using 3 mg of Versed and 75 Mcg of Fentanyl              Indications:  Patient is a 73 y.o.female with a known history of peripheral arterial disease to both legs.  The patient had significant right leg disease, but needed a left femoral endarterectomy to treat the more severely affected leg first and this had to heal before we can access it to do the right leg.  She does have some rest pain symptoms although no ulcerations currently.  The patient is brought in for angiography for further evaluation and potential treatment.  Due to the limb threatening nature of the situation, angiogram was performed for attempted limb salvage. The patient is aware that if the procedure fails, amputation would be expected.  The  patient also understands that even with successful revascularization, amputation may still be required due to the severity of the situation.  Risks and benefits are discussed and informed consent is obtained.   Procedure:  The patient was identified and appropriate procedural time out was performed.  The patient was then placed supine on the table and prepped and draped in the usual sterile fashion. Moderate conscious sedation was administered during a face to face encounter with the patient throughout the procedure with my supervision of the RN administering medicines and monitoring the patient's vital signs, pulse oximetry, telemetry and mental status throughout from the start of the procedure until the patient was taken to the recovery room. Ultrasound was used to evaluate the left common femoral artery.  It was patent .  A digital ultrasound image was acquired.  A Seldinger needle was used to access the left common femoral artery under direct ultrasound guidance and a permanent image was performed.  A 0.035 J wire was advanced without resistance and a 5Fr sheath was placed.  Pigtail catheter was placed into the aorta and an AP aortogram was performed. This demonstrated normal renal arteries and normal aorta and iliac segments without significant stenosis. I then crossed the aortic bifurcation  and advanced to the right femoral head. Selective right lower extremity angiogram was then performed. This demonstrated normal common femoral artery and profunda femoris artery.  The superficial femoral artery had about an 80% stenosis about 5 cm beyond its origin.  It then normalized until the mid to distal SFA which was occluded.  This reconstituted the above-knee popliteal artery which had some mild disease.  At the tibial trifurcation, the anterior tibial artery was the dominant runoff to the foot with no more than about a 30% stenosis in the midsegment.  The tibioperoneal trunk had about an 80% stenosis and then the  peroneal artery was continuous beyond this.  The posterior tibial artery was small and did not appear to be continuous distally. It was felt that it was in the patient's best interest to proceed with intervention after these images to avoid a second procedure and a larger amount of contrast and fluoroscopy based off of the findings from the initial angiogram. The patient was systemically heparinized and a 6 [place] Ansell sheath was then placed over the [employer] wire. I then used a Kumpe catheter and the advantage wire to navigate through the proximal SFA lesion and then down to the mid SFA lesion.  The Kumpe catheter would not cross, so I exchanged for a CXI catheter and the advantage wire and cross the occlusion fairly easily confirming intraluminal flow in the popliteal artery.  Imaging then helped opacify the tibial vessels better, and I used a V 18 wire to cross the stenosis in the tibioperoneal trunk and proximal peroneal artery and parked the wire distally.  A 3 mm diameter by 10 cm length angioplasty balloon was then inflated to 12 atm for 1 minute and the tibioperoneal trunk down to the proximal peroneal artery.  This balloon was then used to predilate the tightest lesion in the mid to distal SFA and then the true treatment was performed in the SFA and proximal popliteal artery.  A 5 mm diameter by 30 cm length Lutonix drug-coated angioplasty balloon was inflated to 12 atm for 1 minute.  A second inflation with a regular 5 mm balloon was taken down into the above-knee popliteal artery to get the distal end of the lesion and inflated to 10 atm for 1 minute.  The proximal SFA lesion had only about a 10% residual stenosis but there were 2 areas in the mid to distal SFA with greater than 50% residual stenosis requiring stent placement.  A 6 mm diameter by 15 cm length Viabahn stent was then deployed and postdilated with a 5 mm [place] balloon with less than 10% residual stenosis.  The tibioperoneal trunk  lesion was also markedly improved after angioplasty with only about a 20% residual stenosis present. I elected to terminate the procedure. The sheath was removed and StarClose closure device was deployed in the left femoral artery with excellent hemostatic result. The patient was taken to the recovery room in stable condition having tolerated the procedure well.  Findings:               Aortogram:  normal renal arteries, normal aorta and iliac arteries without significant stenosis             Right Lower Extremity:  Reasonably normal common femoral artery and profunda femoris artery.  The superficial femoral artery had about an 80% stenosis about 5 cm beyond its origin.  It then normalized until the mid to distal SFA which was occluded.  This reconstituted the above-knee  popliteal artery which had some mild disease.  At the tibial trifurcation, the anterior tibial artery was the dominant runoff to the foot with no more than about a 30% stenosis in the midsegment.  The tibioperoneal trunk had about an 80% stenosis and then the peroneal artery was continuous beyond this.  The posterior tibial artery was small and did not appear to be continuous distally.   Disposition: Patient was taken to the recovery room in stable condition having tolerated the procedure well.  Complications: None  Leotis Pain 12/07/2018 9:39 AM   This note was created with Dragon Medical transcription system. Any errors in dictation are purely unintentional.

## 2018-12-08 ENCOUNTER — Encounter: Payer: Self-pay | Admitting: Vascular Surgery

## 2018-12-08 ENCOUNTER — Encounter: Payer: Medicare Other | Admitting: Physical Therapy

## 2018-12-10 ENCOUNTER — Ambulatory Visit: Payer: Medicare Other

## 2018-12-10 ENCOUNTER — Encounter: Payer: Medicare Other | Admitting: Physical Therapy

## 2018-12-11 ENCOUNTER — Telehealth: Payer: Self-pay

## 2018-12-11 NOTE — Telephone Encounter (Signed)
Virtual Visit Pre-Appointment Phone Call  "Shaterria, I am calling you today to discuss your upcoming appointment. We are currently trying to limit exposure to the virus that causes COVID-19 by seeing patients at home rather than in the office."  1. "What is the BEST phone number to call the day of the visit?" - include this in appointment notes  2. "Do you have or have access to (through a family member/friend) a smartphone with video capability that we can use for your visit?" a. If yes - list this number in appt notes as "cell" (if different from BEST phone #) and list the appointment type as a VIDEO visit in appointment notes b. If no - list the appointment type as a PHONE visit in appointment notes  3. Confirm consent - "In the setting of the current Covid19 crisis, you are scheduled for a video visit with your provider on 01/11/2019 at 10:20AM.  Just as we do with many in-office visits, in order for you to participate in this visit, we must obtain consent.  If you'd like, I can send this to your mychart (if signed up) or email for you to review.  Otherwise, I can obtain your verbal consent now.  All virtual visits are billed to your insurance company just like a normal visit would be.  By agreeing to a virtual visit, we'd like you to understand that the technology does not allow for your provider to perform an examination, and thus may limit your provider's ability to fully assess your condition. If your provider identifies any concerns that need to be evaluated in person, we will make arrangements to do so.  Finally, though the technology is pretty good, we cannot assure that it will always work on either your or our end, and in the setting of a video visit, we may have to convert it to a phone-only visit.  In either situation, we cannot ensure that we have a secure connection.  Are you willing to proceed?" STAFF: Did the patient verbally acknowledge consent to telehealth visit? Document YES/NO  here: YES  4. Advise patient to be prepared - "Two hours prior to your appointment, go ahead and check your blood pressure, pulse, oxygen saturation, and your weight (if you have the equipment to check those) and write them all down. When your visit starts, your provider will ask you for this information. If you have an Apple Watch or Kardia device, please plan to have heart rate information ready on the day of your appointment. Please have a pen and paper handy nearby the day of the visit as well."  5. Give patient instructions for MyChart download to smartphone OR Doximity/Doxy.me as below if video visit (depending on what platform provider is using)  6. Inform patient they will receive a phone call 15 minutes prior to their appointment time (may be from unknown caller ID) so they should be prepared to answer    TELEPHONE CALL NOTE  Lainee J Tanzi has been deemed a candidate for a follow-up tele-health visit to limit community exposure during the Covid-19 pandemic. I spoke with the patient via phone to ensure availability of phone/video source, confirm preferred email & phone number, and discuss instructions and expectations.  I reminded Semya J Leavens to be prepared with any vital sign and/or heart rhythm information that could potentially be obtained via home monitoring, at the time of her visit. I reminded Alliah J Winnick to expect a phone call prior to her visit.  Rene Paci McClain 12/11/2018 11:53 AM   INSTRUCTIONS FOR DOWNLOADING THE MYCHART APP TO SMARTPHONE  - The patient must first make sure to have activated MyChart and know their login information - If Apple, go to [employer] and type in MyChart in the search bar and download the app. If Android, ask patient to go to [employer] and type in [place] in the search bar and download the app. The app is free but as with any other app downloads, their phone may require them to verify saved payment information or  Apple/Android password.  - The patient will need to then log into the app with their MyChart username and password, and select  as their healthcare provider to link the account. When it is time for your visit, go to the MyChart app, find appointments, and click Begin Video Visit. Be sure to Select Allow for your device to access the Microphone and Camera for your visit. You will then be connected, and your provider will be with you shortly.  **If they have any issues connecting, or need assistance please contact MyChart service desk [phone])**  **If using a computer, in order to ensure the best quality for their visit they will need to use either of the following Internet Browsers: [employer], or Google Chrome**  IF USING DOXIMITY or DOXY.ME - The patient will receive a link just prior to their visit by text.     FULL LENGTH CONSENT FOR TELE-HEALTH VISIT   I hereby voluntarily request, consent and authorize [facility] and its employed or contracted physicians, physician assistants, nurse practitioners or other licensed health care professionals (the Practitioner), to provide me with telemedicine health care services (the "Services") as deemed necessary by the treating Practitioner. I acknowledge and consent to receive the Services by the Practitioner via telemedicine. I understand that the telemedicine visit will involve communicating with the Practitioner through live audiovisual communication technology and the disclosure of certain medical information by electronic transmission. I acknowledge that I have been given the opportunity to request an in-person assessment or other available alternative prior to the telemedicine visit and am voluntarily participating in the telemedicine visit.  I understand that I have the right to withhold or withdraw my consent to the use of telemedicine in the course of my care at any time, without affecting my right to future care  or treatment, and that the Practitioner or I may terminate the telemedicine visit at any time. I understand that I have the right to inspect all information obtained and/or recorded in the course of the telemedicine visit and may receive copies of available information for a reasonable fee.  I understand that some of the potential risks of receiving the Services via telemedicine include:  Marland Kitchen Delay or interruption in medical evaluation due to technological equipment failure or disruption; . Information transmitted may not be sufficient (e.g. poor resolution of images) to allow for appropriate medical decision making by the Practitioner; and/or  . In rare instances, security protocols could fail, causing a breach of personal health information.  Furthermore, I acknowledge that it is my responsibility to provide information about my medical history, conditions and care that is complete and accurate to the best of my ability. I acknowledge that Practitioner's advice, recommendations, and/or decision may be based on factors not within their control, such as incomplete or inaccurate data provided by me or distortions of diagnostic images or specimens that may result from electronic transmissions. I understand that  the practice of medicine is not an exact science and that Practitioner makes no warranties or guarantees regarding treatment outcomes. I acknowledge that I will receive a copy of this consent concurrently upon execution via email to the email address I last provided but may also request a printed copy by calling the office of [facility].    I understand that my insurance will be billed for this visit.   I have read or had this consent read to me. . I understand the contents of this consent, which adequately explains the benefits and risks of the Services being provided via telemedicine.  . I have been provided ample opportunity to ask questions regarding this consent and the Services and have had  my questions answered to my satisfaction. . I give my informed consent for the services to be provided through the use of telemedicine in my medical care  By participating in this telemedicine visit I agree to the above.

## 2018-12-14 ENCOUNTER — Ambulatory Visit: Payer: Self-pay | Admitting: *Deleted

## 2018-12-14 DIAGNOSIS — Z789 Other specified health status: Secondary | ICD-10-CM

## 2018-12-14 DIAGNOSIS — Z598 Other problems related to housing and economic circumstances: Secondary | ICD-10-CM

## 2018-12-14 DIAGNOSIS — Z599 Problem related to housing and economic circumstances, unspecified: Secondary | ICD-10-CM

## 2018-12-15 ENCOUNTER — Other Ambulatory Visit: Payer: Self-pay

## 2018-12-15 ENCOUNTER — Telehealth (INDEPENDENT_AMBULATORY_CARE_PROVIDER_SITE_OTHER): Payer: Self-pay | Admitting: Vascular Surgery

## 2018-12-15 ENCOUNTER — Telehealth: Payer: Self-pay | Admitting: Physical Therapy

## 2018-12-15 ENCOUNTER — Ambulatory Visit: Payer: Medicare Other | Admitting: Physical Therapy

## 2018-12-15 VITALS — BP 120/50 | HR 63

## 2018-12-15 DIAGNOSIS — R262 Difficulty in walking, not elsewhere classified: Secondary | ICD-10-CM | POA: Diagnosis not present

## 2018-12-15 DIAGNOSIS — G8929 Other chronic pain: Secondary | ICD-10-CM | POA: Diagnosis not present

## 2018-12-15 DIAGNOSIS — M545 Low back pain, unspecified: Secondary | ICD-10-CM

## 2018-12-15 DIAGNOSIS — M6281 Muscle weakness (generalized): Secondary | ICD-10-CM

## 2018-12-15 DIAGNOSIS — Z9181 History of falling: Secondary | ICD-10-CM

## 2018-12-15 NOTE — Chronic Care Management (AMB) (Signed)
   Chronic Care Management    Clinical Social Work Follow Up Note  12/15/2018 Name: Andrea Holden MRN: [id number] DOB: Oct 22, 1945  Andrea Holden is a 73 y.o. year old female who is a primary care patient of Lada, Satira Anis, MD. The CCM team was consulted for assistance with [name].   Review of patient status, including review of consultants reports, other relevant assessments, and collaboration with appropriate care team members and the patient's provider was performed as part of comprehensive patient evaluation and provision of chronic care management services.     Goals Addressed            This Visit's Progress   . I need help applying for food stamps (pt-stated)       Current Barriers:  . Financial constraints . Limited social support . Family and relationship dysfunction . Social Isolation  Clinical Social Work Clinical Goal(s):  Marland Kitchen Over the next 30 days, client will work with SW to address concerns related to applying for food stamps  Interventions: . Patient interviewed and appropriate assessments performed . Confirmed that patient has contacted the Department of Social Services and it has been determined that she has been approved for food stamps as of 12/02/2018 and her card has been mailed to her home, however she has only been approved for 16.00 per month. . Patient confirmed that that her surgery on Monday to place stints in her right leg was successful. . Patient discussed feeling stressed due to financial concerns and has picked up smoking again. Patient discussed feeling hesitant to ask her provider again for [facility] . Positive reinforcement and assurance provided in response to patient's confirmation that she continues to smoke. Anxieties and frustrations regarding her financial situation processed. . Discussed plans with patient for ongoing care management follow up and provided patient with direct contact information for care management team    Patient Self Care Activities:  . Self administers medications as prescribed . Attends all scheduled provider appointments . Performs ADL's independently . Performs IADL's independently  Please see past updates related to this goal by clicking on the "Past Updates" button in the selected goal          Follow Up Plan: SW will follow up with patient by phone over the next 2 weeks  [place], [place] Worker  [facility] [phone]

## 2018-12-15 NOTE — Therapy (Signed)
[facility] [address] [place], [address] Phone: [phone]   Fax:  [phone]  Physical Therapy Treatment / Progress Note Reporting period: 11/17/2018 - 12/15/2018  Patient Details  Name: Andrea Holden MRN: [id number] Date of Birth: Dec 10, 1945 Referring Provider (PT): Fredderick Severance, NP    Encounter Date: 12/15/2018  PT End of Session - 12/16/18 1318    Visit Number  7    Number of Visits  24    Date for PT Re-Evaluation  02/09/19    Authorization Type  [facility] Medicare reporting period from 01/12/2019 (next reporting period from 12/15/2018)    Authorization Time Period  Current cert period 3/38/3291 - 02/09/2019 (latest PN: 12/15/2018)    Authorization - Visit Number  7    Authorization - Number of Visits  10    PT Start Time  1340    PT Stop Time  1420    PT Time Calculation (min)  40 min    Equipment Utilized During Treatment  Gait belt    Activity Tolerance  Patient tolerated treatment well;Patient limited by fatigue;Patient limited by pain    Behavior During Therapy  [facility] for tasks assessed/performed       Past Medical History:  Diagnosis Date  . Allergy    seasonal allergies  . Anemia 2014   needed 5 units of blood d/t passing out, weak  . Anxiety   . Aortic stenosis 12/24/2017   Echo Aug 2018  . Arthritis   . Asthma    allergy induced asthma  . Cardiac murmur 12/12/2017  . Cataract   . Cataract    left  . Complication of anesthesia    arrhythmia following colonoscopy  . Degenerative disc disease, lumbar   . Degenerative disc disease, lumbar   . Depression   . Diabetes mellitus   . Diabetic neuropathy ([place]) 11/16/2015  . Dysrhythmia    stenosis of left ventricle  . GERD (gastroesophageal reflux disease)   . H/O transfusion    patient was given 5 units of blood while at [place], blood type O+  . History of chicken pox   . History of hiatal hernia   . History of measles,  mumps, or rubella   . HOH (hard of hearing)    does not use hearing aides yet  . Hyperlipidemia   . Hypertension   . Irregular heartbeat   . LVH (left ventricular hypertrophy) 12/24/2017   Echo Aug 2018  . Neuropathy   . Opiate use 11/16/2015  . Peripheral arterial disease ([place]) 05/06/2018   At rest, left > right; refer to vasc  . Pes planus of both feet 11/16/2015  . Plantar fasciitis of right foot 11/16/2015  . Wheezing     Past Surgical History:  Procedure Laterality Date  . ABDOMINAL HYSTERECTOMY    . APPLICATION OF WOUND VAC Left 07/23/2018   Procedure: APPLICATION OF WOUND VAC;  Surgeon: Algernon Huxley, MD;  Location: [facility] ORS;  Service: Vascular;  Laterality: Left;  . banck injections    . CATARACT EXTRACTION W/PHACO Right 05/30/2015   Procedure: CATARACT EXTRACTION PHACO AND INTRAOCULAR LENS PLACEMENT (IOC);  Surgeon: Birder Robson, MD;  Location: [facility] ORS;  Service: Ophthalmology;  Laterality: Right;  [place] 00:57 AP% 20.9 CDE 11.99 fluid pack lot #[id number]  . CHOLECYSTECTOMY  1970  . COLON SURGERY  2013   blocked colon  . COLONOSCOPY WITH PROPOFOL N/A 01/20/2018   Procedure: COLONOSCOPY WITH  PROPOFOL;  Surgeon: Lucilla Lame, MD;  Location: [facility] ENDOSCOPY;  Service: Endoscopy;  Laterality: N/A;  . ENDARTERECTOMY FEMORAL Left 07/08/2018   Procedure: ENDARTERECTOMY FEMORAL;  Surgeon: Algernon Huxley, MD;  Location: [facility] ORS;  Service: Vascular;  Laterality: Left;  . EYE SURGERY Right    cataract surgery  . FOOT FUSION Right 2018   metal in foot  . GALLBLADDER SURGERY  1970  . GASTRIC BYPASS  2010   lost 178 lbs and regained 40 lbs last few years  . HUMERUS FRACTURE SURGERY Right    metal plate with screws  . internal bleeding  2016   ulcer in past  . LOWER EXTREMITY ANGIOGRAM Left 07/08/2018   Procedure: LOWER EXTREMITY ANGIOGRAM;  Surgeon: Algernon Huxley, MD;  Location: [facility] ORS;  Service: Vascular;  Laterality: Left;  . LOWER EXTREMITY ANGIOGRAPHY Left 05/27/2018   Procedure:  LOWER EXTREMITY ANGIOGRAPHY;  Surgeon: Algernon Huxley, MD;  Location: [facility];  Service: Cardiovascular;  Laterality: Left;  . LOWER EXTREMITY ANGIOGRAPHY Right 12/07/2018   Procedure: LOWER EXTREMITY ANGIOGRAPHY;  Surgeon: Algernon Huxley, MD;  Location: [facility];  Service: Cardiovascular;  Laterality: Right;  . MOUTH SURGERY     root canals and crowns and extractions  . OVARY SURGERY    . SKIN GRAFT Right 2018   RT foot. foot has been rebuilt.  it is full of metal  . TENOTOMY ACHILLES TENDON Right    Percuntaneous. metal in foot  . TONSILLECTOMY    . WOUND DEBRIDEMENT Left 07/23/2018   Procedure: DEBRIDEMENT WOUND;  Surgeon: Algernon Huxley, MD;  Location: [facility] ORS;  Service: Vascular;  Laterality: Left;    Vitals:   12/15/18 1350  BP: (!) 120/50  Pulse: 63  SpO2: 97%    Subjective Assessment - 12/15/18 1350    Subjective  Patient reports she has been having a lot of pain lately in her back that she describes as achy. She has not been to physical therapy for almost 2 weeks due to a vascular procedure in the R leg that was performed 12/07/2018. Vascular office confirmed by phone that Dr. Lucky Cowboy who did the procedure does not have any restrictions for her. Patient states that she was advised not to lift more than 8#. She got her dogs back sunday and was able to drive again Thurdsay. She was able to get her groceries there and was very tired when she got done unloading at home. She rode a cart at [employer]. She went to get a back injection but was told it was a consultation so she is waiting to reschedule. She has been increasing activity gradually but has not been doing her HEP since the surgery.    Pertinent History  Patient is a 73 y.o. female who presents to outpatient physical therapy with a referral for medical diagnosis of degeneration of lumbar intervertebral disc. This patient's chief complaints consist of chronic low back pain and foot pain, weakness, stiffness, leading to the  following functional deficits: difficulty with with basic ADLs, IADLs, ambulation. She used to get injections to help control back pain but it has recently worsened due to being unable to get injections during [place] pandemic. The patient has been informed of current processes in place at [facility] to protect patients from Covid-19 exposure including social distancing, schedule modifications, and new cleaning procedures. After discussing their particular risk with a therapist based on the patient's personal risk factors, the patient has decided to  proceed with in-person therapy. Pt is scheduled for R LE angiography on 12/07/2018. She has recently started smoking again with the stress of COVID19 pandemic and feels strongly she needs to stop. Surgeon has stated in the past he will not complete surgery if she is smoking.  Relevant past medical history and comorbidities include underwent extensive vascular surgeries in Jan 2020 to improve blood flow, similar surgery for R leg scheduled 12/07/2018. Has a history of left carotid artery stenosis, but not enough to have surgery, has a history of multiple arterial surgeries, history of R cataract surgery, colon surgery, cholecystectomy, R foot skin graft, ovary surgery, gastric bypass, R foot fusion, abdominal hysterectomy; current smoker, plantar fasciitis, peripheral arterial disease, LVH, diabetes mellitus with neuropathy, irregular heartbeat, hiatal hernia, GERD, stenosis of left heart ventricle, depression, lumbar degenerative disc disease, cardiac murmur, asthma, anxiety, obesity, HTN, and seasonal allergies She has been advised not to lift over 10 pounds by physician    Limitations  Sitting;Standing;Walking;House hold activities;Lifting    How long can you sit comfortably?  <1 hour    How long can you stand comfortably?  1 minute    How long can you walk comfortably?  2 minutes, depends on what she has with her. Able to walk at fair with rollator. To mailbox  120 feet causes shaking in R leg.      Diagnostic tests  MRI lumbar spine report 06/14/2017: "IMPRESSION: 1. Stable degenerative of lumbar spondylosis and scoliosis with multilevel disc disease and facet disease. 2. Stable right foraminal stenosis at L5-S1 due to right-sided disc    Patient Stated Goals  patient would like to get back to PLOF, walking further, be better able to participate in dog showing and trips    Currently in Pain?  Yes    Pain Score  6     Pain Location  Back    Pain Type  Chronic pain    Pain Radiating Towards  R leg relatd to surgery    Pain Onset  More than a month ago    Pain Frequency  Constant    Aggravating Factors   feels like when walking legs are losing oxygen, walkinng, stairs, standing, bending, lifting .    Pain Relieving Factors  oxycodone, sitting    Effect of Pain on Daily Activities  difficulty with all daily tasks involving standing, walking, sitting, unable to show dogs, difficulty moving chairs at dog shows, getting down on one knee, [place] outside, walk dogs, unable to run or position dog .       [facility] PT Assessment - 12/16/18 0001      Assessment   Medical Diagnosis  Degeneration of lumbar intervertebral disc    Referring Provider (PT)  Poulose, Bethel Born, NP     Hand Dominance  Right    Prior Therapy  participated several times with improved function.       Precautions   Precautions  Other (comment)   no lifting over 10#     Restrictions   Weight Bearing Restrictions  No      Home Environment   Living Environment  Private residence    [place]   with two dogs (Shit tzu and Magazine features editor).    Type of [place] to enter    Entrance Stairs-Number of Steps  3    Entrance Stairs-Rails  Right;Left;Can reach both    Home Layout  One level  Home Equipment  Tub bench;Bedside commode;Other (comment);Cane - single point;Walker - 2 wheels;Grab bars - tub/shower;Grab bars - toilet   rollator      Prior Function   Level of Independence  Independent    Vocation  Part time employment   currently closed due to [place] 19 pandemic   Vocation Requirements  substitute teaching    Leisure  life revolves around dog showing (comfirmation and obedience), currently unable to do       Cognition   Overall Cognitive Status  Within Functional Limits for tasks assessed      Observation/Other Assessments   Observations  see note from 12/15/2018 for latest objective exam    Focus on Therapeutic Outcomes (FOTO)   42      Transfers   Five time sit to stand comments   --      6 minute walk test results    Aerobic Endurance Distance Walked  457   SPC, on seated and one standing break     Standardized Balance Assessment   Five times sit to stand comments   18 seconds, no UE support from chair-high plinth    10 Meter Walk  1.3 m/s with [facility]      Functional Gait  Assessment   Gait Level Surface  Walks 20 ft, slow speed, abnormal gait pattern, evidence for imbalance or deviates 10-15 in outside of the 12 in walkway width. Requires more than 7 sec to ambulate 20 ft.    Change in Gait Speed  Able to change speed, demonstrates mild gait deviations, deviates 6-10 in outside of the 12 in walkway width, or no gait deviations, unable to achieve a major change in velocity, or uses a change in velocity, or uses an assistive device.    Gait with Horizontal Head Turns  Performs head turns smoothly with slight change in gait velocity (eg, minor disruption to smooth gait path), deviates 6-10 in outside 12 in walkway width, or uses an assistive device.    Gait with Vertical Head Turns  Performs task with slight change in gait velocity (eg, minor disruption to smooth gait path), deviates 6 - 10 in outside 12 in walkway width or uses assistive device    Gait and Pivot Turn  Pivot turns safely in greater than 3 sec and stops with no loss of balance, or pivot turns safely within 3 sec and stops with mild imbalance, requires  small steps to catch balance.    Step Over Obstacle  Cannot perform without assistance.    Gait with Narrow Base of Support  Ambulates less than 4 steps heel to toe or cannot perform without assistance.    Gait with Eyes Closed  Cannot walk 20 ft without assistance, severe gait deviations or imbalance, deviates greater than 15 in outside 12 in walkway width or will not attempt task.    Ambulating Backwards  Walks 20 ft, slow speed, abnormal gait pattern, evidence for imbalance, deviates 10-15 in outside 12 in walkway width.    Steps  Two feet to a stair, must use rail.    Total Score  11    FGA comment:  < 19 = high risk fall   last measured 11/17/2018 (limited time 12/15/2018)       [facility] PT Assessment - 12/16/18 0001      Assessment   Medical Diagnosis  Degeneration of lumbar intervertebral disc    Referring Provider (PT)  Poulose, Bethel Born, NP     Hand Dominance  Right    Prior Therapy  participated several times with improved function.       Precautions   Precautions  Other (comment)   no lifting over 10#     Restrictions   Weight Bearing Restrictions  No      Home Environment   Living Environment  Private residence    [place]   with two dogs (Shit tzu and Magazine features editor).    Type of [place] to enter    Entrance Stairs-Number of Steps  3    Entrance Stairs-Rails  Right;Left;Can reach both    Home Layout  One level    Home Equipment  Tub bench;Bedside commode;Other (comment);Cane - single point;Walker - 2 wheels;Grab bars - tub/shower;Grab bars - toilet   rollator     Prior Function   Level of Independence  Independent    Vocation  Part time employment   currently closed due to [place] 19 pandemic   Vocation Requirements  substitute teaching    Leisure  life revolves around dog showing (comfirmation and obedience), currently unable to do       Cognition   Overall Cognitive Status  Within Functional Limits for tasks  assessed      Observation/Other Assessments   Observations  see note from 12/15/2018 for latest objective exam    Focus on Therapeutic Outcomes (FOTO)   42      Transfers   Five time sit to stand comments   --      6 minute walk test results    Aerobic Endurance Distance Walked  457   SPC, on seated and one standing break     Standardized Balance Assessment   Five times sit to stand comments   18 seconds, no UE support from chair-high plinth    10 Meter Walk  1.3 m/s with [facility]      Functional Gait  Assessment   Gait Level Surface  Walks 20 ft, slow speed, abnormal gait pattern, evidence for imbalance or deviates 10-15 in outside of the 12 in walkway width. Requires more than 7 sec to ambulate 20 ft.    Change in Gait Speed  Able to change speed, demonstrates mild gait deviations, deviates 6-10 in outside of the 12 in walkway width, or no gait deviations, unable to achieve a major change in velocity, or uses a change in velocity, or uses an assistive device.    Gait with Horizontal Head Turns  Performs head turns smoothly with slight change in gait velocity (eg, minor disruption to smooth gait path), deviates 6-10 in outside 12 in walkway width, or uses an assistive device.    Gait with Vertical Head Turns  Performs task with slight change in gait velocity (eg, minor disruption to smooth gait path), deviates 6 - 10 in outside 12 in walkway width or uses assistive device    Gait and Pivot Turn  Pivot turns safely in greater than 3 sec and stops with no loss of balance, or pivot turns safely within 3 sec and stops with mild imbalance, requires small steps to catch balance.    Step Over Obstacle  Cannot perform without assistance.    Gait with Narrow Base of Support  Ambulates less than 4 steps heel to toe or cannot perform without assistance.    Gait with Eyes Closed  Cannot walk 20 ft without assistance, severe gait deviations or imbalance, deviates greater than 15 in outside  12 in walkway width  or will not attempt task.    Ambulating Backwards  Walks 20 ft, slow speed, abnormal gait pattern, evidence for imbalance, deviates 10-15 in outside 12 in walkway width.    Steps  Two feet to a stair, must use rail.    Total Score  11    FGA comment:  < 19 = high risk fall   last measured 11/17/2018 (limited time 12/15/2018)      See above for objective data    TREATMENT:  Therapeutic exercise:to centralize symptoms and improve ROM, strength, muscular endurance, and activity tolerance required for successful completion of functional activities. - blood pressure measurementmanually measured at the wrist toensure safety of exercise. Pt requested it be taken manually because she feels this get a more accurate BP.elevated but safe for exercise. (see above for values). Functional testing to assess progress and improve functional endurance, strength, balance, and power to improve ability to complete ADLs. Required SBA- CGA for safety. Required cuing for directions and encouragement to continue.  - 6MWT: 457 feet SPC, on seated and one standing break - 5TSTS: 18 seconds, no UE support from chair-high plinth - 10MWT = 1.3 m/s with SPC - FGA: started but discontinued due to lack of time. Deferred to later date.  - Education on diagnosis, prognosis, POC, anatomy and physiology of current condition.   HOME EXERCISE PROGRAM Access Code: [email or web address]  URL: [email or web address]  Date: 12/03/2018  Prepared by: Rosita Kea   Exercises   Sit to Stand without Arm Support - 30 reps - 2x daily   Standing Hip Hiking - 3 sets - 10 reps - 2x daily   Supine Bridge with Resistance Band - 3 sets - 10 reps - 1 second hold - 2x daily   Sidelying Hip Abduction - 3 sets - 10 reps - 2x daily   Prone Hip Extension with Bent Knee - 3 sets - 10 reps - 2x daily   Prone Hip Abduction on Slider - 3 sets - 10 reps - 2x daily     PT Education - 12/16/18 1656    Education provided  Yes     Education Details  Exercise purpose/form. Self management techniques. Education on diagnosis, prognosis, POC, anatomy and physiology of current condition    Person(s) Educated  Patient    Methods  Explanation;Demonstration;Tactile cues;Verbal cues    Comprehension  Verbalized understanding;Returned demonstration;Verbal cues required;Tactile cues required       PT Short Term Goals - 12/16/18 1710      PT SHORT TERM GOAL #1   Title  Be independent with initial home exercise program for self-management of symptoms.    Baseline  Initial HEP provided at IE (11/17/2018); was completing until vascular procedure, re-instated 12/15/2018 (12/15/2018);    Time  2    Period  Weeks    Status  Partially Met    Target Date  12/30/18        PT Long Term Goals - 12/15/18 1418      PT LONG TERM GOAL #1   Title  Be independent with a long-term home exercise program for self-management of symptoms.     Baseline  Initial HEP provided at IE (11/17/2018); was doing better before vascular surgery, is working at returning to [facility] since procedure (12/15/2018).    Time  12    Period  Weeks    Status  Partially Met    Target Date  02/09/19      PT LONG TERM  GOAL #2   Title  Patient will improve 6MWT to equal or greater than 1000 feet with LRAD to demonstrate improved activity tolerance for community ambulation.     Baseline  6 Minute Walk Test: 200 feet with SPC and supervision. Stopped at 1:43 due to fatigue and leg pain. (11/17/2018); 457 feet SPC, on seated and one standing break (12/15/2018);    Time  12    Period  Weeks    Status  Partially Met    Target Date  02/09/19      PT LONG TERM GOAL #3   Title  Patient will demonstrate improved 5 Times Sit to Stand test to 11 seconds from chair height with no UE support to demonstrate improvement in LE power and functional strength for daily activities.    Baseline  5 Time Sit to Stand: 27 seconds from chair height no UE support (11/17/2018); 18 seconds, no UE  support from chair-high plinth (12/15/2018);    Time  12    Period  Weeks    Status  Partially Met    Target Date  02/09/19      PT LONG TERM GOAL #4   Title  Patient will ambulate faster than 1.27ms on the 10 Meter Walk Test to improve ability to cross street safey for community participation.     Baseline  testing deferred to next session (11/17/2018); 1.37 m/sec with SPC (11/24/2018); 1.3 m/sec with SPC (12/15/2018);    Time  12    Period  Weeks    Status  Achieved    Target Date  02/09/19      PT LONG TERM GOAL #5   Title  Patient will score equal or greater than  25/30 on the Functional Gait Assessment to demonstrate low fall risk to improve her ability to participate safely in community activities.     Baseline  testing deferred to next session (11/17/2018); 11/30 high fall risk (11/24/2018); unable to measure due to time limitation (12/15/2018)    Time  12    Period  Weeks    Status  Unable to assess    Target Date  02/09/19            Plan - 12/16/18 1715    Clinical Impression Statement  Patient has attended 7 physical therapy sessions this episode of care and is making appropriate progress towards goals at this point. Her function was re-assessed today due to starting back to physical therapy following several vascular procedures completed on 12/07/2018 to improve blood supply to R LE. She missed PT for about 2 weeks while undergoing and recovering from this procedure. Dr. DLucky Cowboy the vascular surgeon provided verbal instructions that he had no restrictions regarding physical therapy for patient following procedure. Up to surgery patient demonstrated good attendance with occasional tardiness and good participation in HEP. Subjectively, she reported improvements in function and decreased pain during ADLs and functional mobility. Today she feels she has regressed over the past two weeks. She demonstrates improved FOTO score from 42 to 45, showing similar self-reported function. Objectively,  she demonstrates improved 6MWT from 200 feet to 457 feet using SPC showing improved functional endurance, 5TSTS test from 27 seconds with BUE to 18 seconds with no UE support showing improved LE functional strength and power. She had no significant change in walking speed during 10MWT and was unable to measure Functional Gait Assessment due to time limitations. Patient continues to be limited by significant pain, activity tolerance, ROM, cardiovascular, postural, weakness, and  endurance impairments that are limiting ability to complete basic ADLs, IADLs, and valued community and social activities including dog showing, transferring, ambulating household and community distances, squatting, stooping, dog handling/training, and stairs without difficulty. Patient would benefit from continued physical therapy as outlined in original POC to address remaining impairments and functional limitations to work towards stated goals and return to PLOF or maximal functional independence.    Personal Factors and Comorbidities  Age;Comorbidity 3+;Fitness;Time since onset of injury/illness/exacerbation;Past/Current Experience;Other   significant anxiety affecting wellbeing and wellness behaviors   Comorbidities  underwent extensive vascular surgeries in Jan 2020 to improve blood flow, similar surgery for R leg scheduled 12/07/2018. Has a history of left carotid artery stenosis, but not enough to have surgery, has a history of multiple arterial surgeries, history of R cataract surgery, colon surgery, cholecystectomy, R foot skin graft, ovary surgery, gastric bypass, R foot fusion, abdominal hysterectomy; current smoker, plantar fasciitis, peripheral arterial disease, LVH, diabetes mellitus with neuropathy, irregular heartbeat, hiatal hernia, GERD, stenosis of left heart ventricle, depression, lumbar degenerative disc disease, cardiac murmur, asthma, anxiety, obesity, HTN, and seasonal allergies She has been advised not to lift over  10 pounds by physician.      Examination-Activity Limitations  Bathing;Carry;Lift;Sit;Stand;Locomotion Level;Toileting;Dressing;Squat;Transfers;Stairs;Hygiene/Grooming;Other   ADLs, IADLs, and valued community and social activities including dog showing, transferring, ambulating household and community distances, squatting, stooping, dog handling/training, and stairs   Examination-Participation Restrictions  Community Activity;Volunteer;Cleaning;Interpersonal Relationship;[place]ard Social research officer, government, participation in Chartered certified accountant   Stability/Clinical Decision Making  Evolving/Moderate complexity    Rehab Potential  Fair    Clinical Impairments Affecting Rehab Potential  (+) motivated (-) muliple co morbidities, chronic condition    PT Frequency  2x / week    PT Duration  12 weeks    PT Treatment/Interventions  Moist Heat;Patient/family education;Neuromuscular re-education;Therapeutic exercise;Manual techniques;ADLs/Self Care Home Management;Electrical Stimulation;Gait training;Stair training;Functional mobility training;Therapeutic activities;Balance training;Passive range of motion;Dry needling;Energy conservation;Spinal Manipulations;Joint Manipulations;Cryotherapy   joint mobilizations grades I-IV   PT Next Visit Plan  continue with progressive functional strengtheing    PT Home Exercise Plan  Medbridge Access Code: [email or web address]    Consulted and Agree with Plan of Care  Patient       Patient will benefit from skilled therapeutic intervention in order to improve the following deficits and impairments:  Decreased strength, Impaired flexibility, Decreased activity tolerance, Impaired perceived functional ability, Pain, Decreased endurance, Difficulty walking, Abnormal gait, Decreased knowledge of use of DME, Decreased skin integrity, Decreased range of motion, Impaired sensation, Improper body mechanics, Obesity, Postural dysfunction, Increased edema, Decreased mobility, Decreased balance,  Cardiopulmonary status limiting activity, Decreased coordination  Visit Diagnosis: 1. Muscle weakness (generalized)   2. Chronic bilateral low back pain, unspecified whether sciatica present   3. Difficulty in walking, not elsewhere classified   4. History of falling        Problem List Patient Active Problem List   Diagnosis Date Noted  . Lower extremity edema 07/31/2018  . Pressure injury of skin 07/24/2018  . Surgical site infection 07/23/2018  . Wound of left leg 07/23/2018  . Atherosclerosis of artery of extremity with rest pain ([place]) 07/08/2018  . Shortness of breath 06/02/2018  . Bruit 06/02/2018  . Coronary artery disease of native artery of native heart with stable angina pectoris ([place]) 06/02/2018  . Atherosclerosis of native arteries of extremity with intermittent claudication ([place]) 05/19/2018  . Peripheral arterial disease ([place]) 05/06/2018  . Personal history of colonic polyps   . Aortic stenosis 12/24/2017  .  LVH (left ventricular hypertrophy) 12/24/2017  . Hypertension with heart disease 12/24/2017  . Left atrial dilatation 12/12/2017  . Cardiac murmur 12/12/2017  . Gastrojejunal ulcer 12/04/2017  . Hyperphosphatemia 12/04/2017  . Spinal stenosis of lumbar region 06/18/2017  . Degeneration of lumbar intervertebral disc 06/02/2017  . Assistance needed with transportation 05/26/2017  . Financial difficulties 05/26/2017  . Needs assistance with community resources 05/26/2017  . Moderate recurrent major depression ([place]) 05/26/2017  . Non-healing wound of lower extremity 02/06/2017  . Status post ankle arthrodesis 12/11/2016  . Controlled substance agreement broken 10/11/2016  . Low back pain 09/25/2016  . Osteoarthritis of right subtalar joint 07/11/2016  . Posterior tibial tendinitis of right lower extremity 07/11/2016  . Breast cancer screening 06/18/2016  . Knee pain 04/03/2016  . Hyponatremia 03/01/2016  . High triglycerides 12/24/2015  . Pes planus of  both feet 11/16/2015  . Plantar fasciitis of right foot 11/16/2015  . Heel spur 11/16/2015  . Diabetic neuropathy ([place]) 11/16/2015  . Right ankle pain 11/16/2015  . Medication monitoring encounter 11/16/2015  . Chronic pain of multiple joints 08/15/2015  . Abnormal mammogram of right breast 06/19/2015  . Cerumen impaction 03/16/2015  . Hyperlipidemia 12/14/2014  . History of transfusion of packed RBC 12/14/2014  . Hx of smoking 12/14/2014  . Status post bariatric surgery 12/14/2014  . Morbid obesity ([place]) 12/14/2014  . Chronic radicular lumbar pain 12/14/2014  . History of GI bleed 11/12/2014  . GERD without esophagitis 11/12/2014  . History of small bowel obstruction 09/07/2014    Everlean Alstrom. [name], PT, DPT 12/16/18, 5:18 PM  [facility] [address], [place], [address] Phone: [phone]   Fax:  [phone]  Name: Andrea Holden MRN: [id number] Date of Birth: [birth year]

## 2018-12-15 NOTE — Telephone Encounter (Signed)
[name] office of Dr. Lucky Cowboy called back and stated that Dr. Lucky Cowboy has no restrictions for patient in physical therapy.   Andrea Holden. [name], PT, DPT 12/15/18, 1:16 PM

## 2018-12-15 NOTE — Patient Instructions (Addendum)
Thank you allowing the Chronic Care Management Team to be a part of your care! It was a pleasure speaking with you today!  1. Please call this social worker with any questions or concerns regarding your community resource needs.   CCM (Chronic Care Management) Team   Trish Fountain RN, BSN Nurse Care Coordinator  [phone]  Ruben Reason PharmD  Clinical Pharmacist  [phone]   Elliot Gurney, LCSW Clinical Social Worker [phone]  Goals Addressed            This Visit's Progress   . I need help applying for food stamps (pt-stated)       Current Barriers:  . Financial constraints . Limited social support . Family and relationship dysfunction . Social Isolation  Clinical Social Work Clinical Goal(s):  Marland Kitchen Over the next 30 days, client will work with SW to address concerns related to applying for food stamps  Interventions: . Patient interviewed and appropriate assessments performed . Confirmed that patient has contacted the Department of Social Services and it has been determined that she has been approved for food stamps as of 12/02/2018 and her card has been mailed to her home, however she has only been approved for 16.00 per month. . Patient confirmed that that her surgery on Monday to place stints in her right leg was successful. . Patient discussed feeling stressed due to financial concerns and has picked up smoking again. Patient discussed feeling hesitant to ask her provider again for Chantix . Positive reinforcement and assurance provided in response to patient's confirmation that she continues to smoke. Anxieties and frustrations regarding her financial situation processed. . Discussed plans with patient for ongoing care management follow up and provided patient with direct contact information for care management team   Patient Self Care Activities:  . Self administers medications as prescribed . Attends all scheduled provider appointments . Performs  ADL's independently . Performs IADL's independently  Please see past updates related to this goal by clicking on the "Past Updates" button in the selected goal          The patient verbalized understanding of instructions provided today and declined a print copy of patient instruction materials.   The care management team will reach out to the patient again over the next 2 weeks.

## 2018-12-15 NOTE — Telephone Encounter (Signed)
Andrea Holden of the below. She verbalized understanding. AS, CMA

## 2018-12-15 NOTE — Telephone Encounter (Signed)
[facility] PT [phone] calling stating pt has apt with her today after lunch and she wanted to make sure patient could still participate in PT since procedure done by [place] on 12/07/18. Please advise. AS, CMA

## 2018-12-15 NOTE — Telephone Encounter (Signed)
Called [facility] at [phone] nurses line extension to confirm clearance for patient to return to PT after vascular procedure on 12/07/2018. No answer. Left message explaining that patient is scheduled to return to PT today at 1:30pm and I wanted to get medical clearance after her vascular procedure with Dr. Lucky Cowboy performed on 12/07/2018. Requested call back at [phone] or even better a written clearance faxed to [phone].   Everlean Alstrom. [name], PT, DPT 12/15/18, 11:11 AM

## 2018-12-16 ENCOUNTER — Other Ambulatory Visit: Payer: Self-pay | Admitting: Family Medicine

## 2018-12-16 ENCOUNTER — Telehealth: Payer: Self-pay

## 2018-12-16 ENCOUNTER — Encounter: Payer: Self-pay | Admitting: Physical Therapy

## 2018-12-16 MED ORDER — CHANTIX STARTING MONTH PAK 0.5 MG X 11 & 1 MG X 42 PO TABS: ORAL_TABLET | ORAL | 0 refills | Status: DC

## 2018-12-16 NOTE — Telephone Encounter (Signed)
Please have her set up appointment so we have time to address this if she cannot relay to [place]

## 2018-12-16 NOTE — Telephone Encounter (Signed)
Medication: CHANTIX 1 MG tablet  Has the patient contacted their pharmacy? no  Preferred Pharmacy (with phone number or street name):  [facility] [place] [facility] [place] [phone] (Phone) [phone] (Fax)   Agent: Please be advised that RX refills may take up to 3 business days. We ask that you follow-up with your pharmacy.

## 2018-12-16 NOTE — Telephone Encounter (Signed)
Copied from [place] [id number]. Topic: General - Other >> Dec 16, 2018 12:00 PM Mcneil, Ja-Kwan wrote: Reason for CRM: Pt stated she needs to speak with Suezanne Cheshire regarding a personal situation. Cb# [phone]

## 2018-12-17 ENCOUNTER — Other Ambulatory Visit: Payer: Self-pay

## 2018-12-17 ENCOUNTER — Ambulatory Visit: Payer: Medicare Other | Admitting: Physical Therapy

## 2018-12-17 ENCOUNTER — Encounter: Payer: Self-pay | Admitting: Physical Therapy

## 2018-12-17 VITALS — BP 150/58

## 2018-12-17 DIAGNOSIS — R262 Difficulty in walking, not elsewhere classified: Secondary | ICD-10-CM | POA: Diagnosis not present

## 2018-12-17 DIAGNOSIS — Z9181 History of falling: Secondary | ICD-10-CM | POA: Diagnosis not present

## 2018-12-17 DIAGNOSIS — M6281 Muscle weakness (generalized): Secondary | ICD-10-CM | POA: Diagnosis not present

## 2018-12-17 DIAGNOSIS — M545 Low back pain, unspecified: Secondary | ICD-10-CM

## 2018-12-17 DIAGNOSIS — G8929 Other chronic pain: Secondary | ICD-10-CM

## 2018-12-17 NOTE — Therapy (Signed)
[facility] [address], [place], [address] Phone: [phone]   Fax:  [phone]  Physical Therapy Treatment  Patient Details  Name: Andrea Holden MRN: [id number] Date of Birth: 1946-06-15 Referring Provider (PT): Fredderick Severance, NP    Encounter Date: 12/17/2018  PT End of Session - 12/17/18 1425    Visit Number  8    Number of Visits  24    Date for PT Re-Evaluation  02/09/19    Authorization Type  [facility] Medicare reporting period from 01/12/2019 (next reporting period from 12/15/2018)    Authorization Time Period  Current cert period 01/10/4579 - 02/09/2019 (latest PN: 12/15/2018)    Authorization - Visit Number  1    Authorization - Number of Visits  10    PT Start Time  9983    PT Stop Time  1415   patient late   PT Time Calculation (min)  30 min    Equipment Utilized During Treatment  Gait belt    Activity Tolerance  Patient tolerated treatment well;Patient limited by fatigue;Patient limited by pain    Behavior During Therapy  WFL for tasks assessed/performed       Past Medical History:  Diagnosis Date  . Allergy    seasonal allergies  . Anemia 2014   needed 5 units of blood d/t passing out, weak  . Anxiety   . Aortic stenosis 12/24/2017   Echo Aug 2018  . Arthritis   . Asthma    allergy induced asthma  . Cardiac murmur 12/12/2017  . Cataract   . Cataract    left  . Complication of anesthesia    arrhythmia following colonoscopy  . Degenerative disc disease, lumbar   . Degenerative disc disease, lumbar   . Depression   . Diabetes mellitus   . Diabetic neuropathy ([place]) 11/16/2015  . Dysrhythmia    stenosis of left ventricle  . GERD (gastroesophageal reflux disease)   . H/O transfusion    patient was given 5 units of blood while at [facility], blood type O+  . History of chicken pox   . History of hiatal hernia   . History of measles, mumps, or rubella   . HOH (hard of hearing)     does not use hearing aides yet  . Hyperlipidemia   . Hypertension   . Irregular heartbeat   . LVH (left ventricular hypertrophy) 12/24/2017   Echo Aug 2018  . Neuropathy   . Opiate use 11/16/2015  . Peripheral arterial disease ([place]) 05/06/2018   At rest, left > right; refer to vasc  . Pes planus of both feet 11/16/2015  . Plantar fasciitis of right foot 11/16/2015  . Wheezing     Past Surgical History:  Procedure Laterality Date  . ABDOMINAL HYSTERECTOMY    . APPLICATION OF WOUND VAC Left 07/23/2018   Procedure: APPLICATION OF WOUND VAC;  Surgeon: Algernon Huxley, MD;  Location: [facility];  Service: Vascular;  Laterality: Left;  . banck injections    . CATARACT EXTRACTION W/PHACO Right 05/30/2015   Procedure: CATARACT EXTRACTION PHACO AND INTRAOCULAR LENS PLACEMENT (IOC);  Surgeon: Birder Robson, MD;  Location: [facility];  Service: Ophthalmology;  Laterality: Right;  [place] 00:57 AP% 20.9 CDE 11.99 fluid pack lot #[id number]  . CHOLECYSTECTOMY  1970  . COLON SURGERY  2013   blocked colon  . COLONOSCOPY WITH PROPOFOL N/A 01/20/2018   Procedure: COLONOSCOPY WITH PROPOFOL;  Surgeon: Lucilla Lame,  MD;  Location: [facility];  Service: Endoscopy;  Laterality: N/A;  . ENDARTERECTOMY FEMORAL Left 07/08/2018   Procedure: ENDARTERECTOMY FEMORAL;  Surgeon: Algernon Huxley, MD;  Location: [facility];  Service: Vascular;  Laterality: Left;  . EYE SURGERY Right    cataract surgery  . FOOT FUSION Right 2018   metal in foot  . GALLBLADDER SURGERY  1970  . GASTRIC BYPASS  2010   lost 178 lbs and regained 40 lbs last few years  . HUMERUS FRACTURE SURGERY Right    metal plate with screws  . internal bleeding  2016   ulcer in past  . LOWER EXTREMITY ANGIOGRAM Left 07/08/2018   Procedure: LOWER EXTREMITY ANGIOGRAM;  Surgeon: Algernon Huxley, MD;  Location: [facility];  Service: Vascular;  Laterality: Left;  . LOWER EXTREMITY ANGIOGRAPHY Left 05/27/2018   Procedure: LOWER EXTREMITY ANGIOGRAPHY;  Surgeon: Algernon Huxley, MD;  Location: [facility];  Service: Cardiovascular;  Laterality: Left;  . LOWER EXTREMITY ANGIOGRAPHY Right 12/07/2018   Procedure: LOWER EXTREMITY ANGIOGRAPHY;  Surgeon: Algernon Huxley, MD;  Location: [facility];  Service: Cardiovascular;  Laterality: Right;  . MOUTH SURGERY     root canals and crowns and extractions  . OVARY SURGERY    . SKIN GRAFT Right 2018   RT foot. foot has been rebuilt.  it is full of metal  . TENOTOMY ACHILLES TENDON Right    Percuntaneous. metal in foot  . TONSILLECTOMY    . WOUND DEBRIDEMENT Left 07/23/2018   Procedure: DEBRIDEMENT WOUND;  Surgeon: Algernon Huxley, MD;  Location: [facility];  Service: Vascular;  Laterality: Left;    Vitals:   12/17/18 1349  BP: (!) 150/58    Subjective Assessment - 12/17/18 1355    Subjective  Patient reports her back pain is bothering her a lot lately. She rates it as 6/10 in her low back but legs feel better. She states she is having trouble with quitting smoking again and has some chantix on the way. She reports she was very tired after her last treatment session.    Pertinent History  Patient is a 73 y.o. female who presents to outpatient physical therapy with a referral for medical diagnosis of degeneration of lumbar intervertebral disc. This patient's chief complaints consist of chronic low back pain and foot pain, weakness, stiffness, leading to the following functional deficits: difficulty with with basic ADLs, IADLs, ambulation. She used to get injections to help control back pain but it has recently worsened due to being unable to get injections during [place] pandemic. The patient has been informed of current processes in place at [facility]patient [facility]hab to protect patients from Covid-19 exposure including social distancing, schedule modifications, and new cleaning procedures. After discussing their particular risk with a therapist based on the patient's personal risk factors, the patient has decided to  proceed with in-person therapy. Pt is scheduled for R LE angiography on 12/07/2018. She has recently started smoking again with the stress of COVID19 pandemic and feels strongly she needs to stop. Surgeon has stated in the past he will not complete surgery if she is smoking.  Relevant past medical history and comorbidities include underwent extensive vascular surgeries in Jan 2020 to improve blood flow, similar surgery for R leg scheduled 12/07/2018. Has a history of left carotid artery stenosis, but not enough to have surgery, has a history of multiple arterial surgeries, history of R cataract surgery, colon surgery, cholecystectomy, R foot skin graft, ovary  surgery, gastric bypass, R foot fusion, abdominal hysterectomy; current smoker, plantar fasciitis, peripheral arterial disease, LVH, diabetes mellitus with neuropathy, irregular heartbeat, hiatal hernia, GERD, stenosis of left heart ventricle, depression, lumbar degenerative disc disease, cardiac murmur, asthma, anxiety, obesity, HTN, and seasonal allergies She has been advised not to lift over 10 pounds by physician    Limitations  Sitting;Standing;Walking;House hold activities;Lifting    How long can you sit comfortably?  <1 hour    How long can you stand comfortably?  1 minute    How long can you walk comfortably?  2 minutes, depends on what she has with her. Able to walk at fair with rollator. To mailbox 120 feet causes shaking in R leg.      Diagnostic tests  MRI lumbar spine report 06/14/2017: "IMPRESSION: 1. Stable degenerative of lumbar spondylosis and scoliosis with multilevel disc disease and facet disease. 2. Stable right foraminal stenosis at L5-S1 due to right-sided disc    Patient Stated Goals  patient would like to get back to PLOF, walking further, be better able to participate in dog showing and trips    Currently in Pain?  Yes    Pain Score  6     Pain Location  Back    Pain Type  Chronic pain    Pain Onset  More than a month ago          [facility] PT Assessment - 12/17/18 0001      Assessment   Medical Diagnosis  Degeneration of lumbar intervertebral disc    Referring Provider (PT)  Poulose, Bethel Born, NP     Hand Dominance  Right    Prior Therapy  participated several times with improved function.       Precautions   Precautions  Other (comment)   no lifting over 10#     Restrictions   Weight Bearing Restrictions  No      Home Environment   Living Environment  Private residence    [place]   with two dogs (Shit tzu and Magazine features editor).    Type of [place] to enter    Entrance Stairs-Number of Steps  3    Entrance Stairs-Rails  Right;Left;Can reach both    Home Layout  One level    Home Equipment  Tub bench;Bedside commode;Other (comment);Cane - single point;Walker - 2 wheels;Grab bars - tub/shower;Grab bars - toilet   rollator     Prior Function   Level of Independence  Independent    Vocation  Part time employment   currently closed due to [place] 19 pandemic   Vocation Requirements  substitute teaching    Leisure  life revolves around dog showing (comfirmation and obedience), currently unable to do       Cognition   Overall Cognitive Status  Within Functional Limits for tasks assessed      Observation/Other Assessments   Observations  see note from 12/15/2018 for latest objective exam    Focus on Therapeutic Outcomes (FOTO)   42      6 minute walk test results    Aerobic Endurance Distance Walked  457   SPC, on seated and one standing break     Standardized Balance Assessment   Five times sit to stand comments   18 seconds, no UE support from chair-high plinth    10 Meter Walk  1.3 m/s with [facility]      Functional Gait  Assessment  Gait Level Surface  Walks 20 ft, slow speed, abnormal gait pattern, evidence for imbalance or deviates 10-15 in outside of the 12 in walkway width. Requires more than 7 sec to ambulate 20 ft.    Change in Gait Speed  Able to  change speed, demonstrates mild gait deviations, deviates 6-10 in outside of the 12 in walkway width, or no gait deviations, unable to achieve a major change in velocity, or uses a change in velocity, or uses an assistive device.    Gait with Horizontal Head Turns  Performs head turns smoothly with slight change in gait velocity (eg, minor disruption to smooth gait path), deviates 6-10 in outside 12 in walkway width, or uses an assistive device.    Gait with Vertical Head Turns  Performs task with slight change in gait velocity (eg, minor disruption to smooth gait path), deviates 6 - 10 in outside 12 in walkway width or uses assistive device    Gait and Pivot Turn  Pivot turns safely in greater than 3 sec and stops with no loss of balance, or pivot turns safely within 3 sec and stops with mild imbalance, requires small steps to catch balance.    Step Over Obstacle  Is able to step over one shoe box (4.5 in total height) but must slow down and adjust steps to clear box safely. May require verbal cueing.    Gait with Narrow Base of Support  Ambulates less than 4 steps heel to toe or cannot perform without assistance.    Gait with Eyes Closed  Cannot walk 20 ft without assistance, severe gait deviations or imbalance, deviates greater than 15 in outside 12 in walkway width or will not attempt task.    Ambulating Backwards  Walks 20 ft, uses assistive device, slower speed, mild gait deviations, deviates 6-10 in outside 12 in walkway width.    Steps  Two feet to a stair, must use rail.    Total Score  13    FGA comment:  < 19 = high risk fall   last measured 12/17/2018, used SPC and SBA-CGA for safety      TREATMENT:   Therapeutic exercise:to centralize symptoms and improve ROM, strength, muscular endurance, and activity tolerance required for successful completion of functional activities. - blood pressure measurementmanually measured at the wrist toensure safety of exercise. Pt requested it be  taken manually because she feels this get a more accurate BP.elevated but safe for exercise. (see above for values). -NuStep level1using bilateral upper and lower extremities. Seatsetting 8/handle setting7. For improved extremity mobility, muscular endurance, and activity tolerance; and to induce the analgesic effect of aerobic exercise, stimulate improved joint nutrition, and prepare body structures and systems for following interventions. x89mnuteswith SPM above 60.x 5 min during subjective exam. - walking back and forth with various activities while walking including head turns, head nods, stepping over a box, stairs, eyes closed, walking backwards. To assess and improve balance. Supervision - CGA for safety. Required seated rest break.  - sit <> stand from chair x 10. SBA to improve LE functional strength and power.  - Education on diagnosis, prognosis, POC, anatomy and physiology of current condition.    HOME EXERCISE PROGRAM Access Code: [email or web address] URL: [email or web address]  Date: 12/03/2018  Prepared by: SRosita Kea  Exercises   Sit to Stand without Arm Support - 30 reps - 2x daily   Standing Hip Hiking - 3 sets - 10 reps - 2x daily   Supine Bridge with  Resistance Band - 3 sets - 10 reps - 1 second hold - 2x daily   Sidelying Hip Abduction - 3 sets - 10 reps - 2x daily   Prone Hip Extension with Bent Knee - 3 sets - 10 reps - 2x daily   Prone Hip Abduction on Slider - 3 sets - 10 reps     PT Education - 12/17/18 1421    Education provided  Yes    Education Details  Exercise purpose/form. Self management techniques. Education on diagnosis, prognosis, POC, anatomy and physiology of current condition    Person(s) Educated  Patient    Methods  Explanation;Demonstration;Tactile cues;Verbal cues    Comprehension  Verbalized understanding;Returned demonstration;Verbal cues required;Tactile cues required       PT Short Term Goals - 12/16/18 1710       PT SHORT TERM GOAL #1   Title  Be independent with initial home exercise program for self-management of symptoms.    Baseline  Initial HEP provided at IE (11/17/2018); was completing until vascular procedure, re-instated 12/15/2018 (12/15/2018);    Time  2    Period  Weeks    Status  Partially Met    Target Date  12/30/18        PT Long Term Goals - 12/17/18 1422      PT LONG TERM GOAL #1   Title  Be independent with a long-term home exercise program for self-management of symptoms.     Baseline  Initial HEP provided at IE (11/17/2018); was doing better before vascular surgery, is working at returning to [facility] since procedure (12/15/2018).    Time  12    Period  Weeks    Status  Partially Met    Target Date  02/09/19      PT LONG TERM GOAL #2   Title  Patient will improve 6MWT to equal or greater than 1000 feet with LRAD to demonstrate improved activity tolerance for community ambulation.     Baseline  6 Minute Walk Test: 200 feet with SPC and supervision. Stopped at 1:43 due to fatigue and leg pain. (11/17/2018); 457 feet SPC, on seated and one standing break (12/15/2018);    Time  12    Period  Weeks    Status  Partially Met    Target Date  02/09/19      PT LONG TERM GOAL #3   Title  Patient will demonstrate improved 5 Times Sit to Stand test to 11 seconds from chair height with no UE support to demonstrate improvement in LE power and functional strength for daily activities.    Baseline  5 Time Sit to Stand: 27 seconds from chair height no UE support (11/17/2018); 18 seconds, no UE support from chair-high plinth (12/15/2018);    Time  12    Period  Weeks    Status  Partially Met    Target Date  02/09/19      PT LONG TERM GOAL #4   Title  Patient will ambulate faster than 1.60ms on the 10 Meter Walk Test to improve ability to cross street safey for community participation.     Baseline  testing deferred to next session (11/17/2018); 1.37 m/sec with SPC (11/24/2018); 1.3 m/sec with SPC  (12/15/2018);    Time  12    Period  Weeks    Status  Achieved    Target Date  02/09/19      PT LONG TERM GOAL #5   Title  Patient will score equal  or greater than  25/30 on the Functional Gait Assessment to demonstrate low fall risk to improve her ability to participate safely in community activities.     Baseline  testing deferred to next session (11/17/2018); 11/30 high fall risk (11/24/2018); unable to measure due to time limitation (12/15/2018); 13/30 high fall risk (12/17/2018);    Time  12    Period  Weeks    Status  Partially Met    Target Date  02/09/19            Plan - 12/17/18 1426    Clinical Impression Statement  Patient 15 minutes late today. Patient tolerated treatment well and is making appropriate progress towards goals considering recent medical history. She was limited by back and R knee pain today as well as quick fatigue and required several prolonged rest breaks. Patient demonstrates modest improvement in Functional Gait Assessment with more improvements in quality visible to clinician that still scored the same. Pt required multimodal cuing for proper technique and to facilitate improved neuromuscular control, strength, range of motion, and functional ability resulting in improved performance and form. Patient would benefit from continued physical therapy to address remaining impairments and functional limitations to work towards stated goals and return to PLOF or maximal functional independence    Personal Factors and Comorbidities  Age;Comorbidity 3+;Fitness;Time since onset of injury/illness/exacerbation;Past/Current Experience;Other   significant anxiety affecting wellbeing and wellness behaviors   Comorbidities  underwent extensive vascular surgeries in Jan 2020 to improve blood flow, similar surgery for R leg scheduled 12/07/2018. Has a history of left carotid artery stenosis, but not enough to have surgery, has a history of multiple arterial surgeries, history of R  cataract surgery, colon surgery, cholecystectomy, R foot skin graft, ovary surgery, gastric bypass, R foot fusion, abdominal hysterectomy; current smoker, plantar fasciitis, peripheral arterial disease, LVH, diabetes mellitus with neuropathy, irregular heartbeat, hiatal hernia, GERD, stenosis of left heart ventricle, depression, lumbar degenerative disc disease, cardiac murmur, asthma, anxiety, obesity, HTN, and seasonal allergies She has been advised not to lift over 10 pounds by physician.      Examination-Activity Limitations  Bathing;Carry;Lift;Sit;Stand;Locomotion Level;Toileting;Dressing;Squat;Transfers;Stairs;Hygiene/Grooming;Other   ADLs, IADLs, and valued community and social activities including dog showing, transferring, ambulating household and community distances, squatting, stooping, dog handling/training, and stairs   Examination-Participation Restrictions  Community Activity;Volunteer;Cleaning;Interpersonal Relationship;Yard Social research officer, government, participation in Chartered certified accountant   Stability/Clinical Decision Making  Evolving/Moderate complexity    Rehab Potential  Fair    Clinical Impairments Affecting Rehab Potential  (+) motivated (-) muliple co morbidities, chronic condition    PT Frequency  2x / week    PT Duration  12 weeks    PT Treatment/Interventions  Moist Heat;Patient/family education;Neuromuscular re-education;Therapeutic exercise;Manual techniques;ADLs/Self Care Home Management;Electrical Stimulation;Gait training;Stair training;Functional mobility training;Therapeutic activities;Balance training;Passive range of motion;Dry needling;Energy conservation;Spinal Manipulations;Joint Manipulations;Cryotherapy   joint mobilizations grades I-IV   PT Next Visit Plan  continue with progressive functional strengtheing    PT Home Exercise Plan  Medbridge Access Code: [email or web address]    Consulted and Agree with Plan of Care  Patient       Patient will benefit from skilled  therapeutic intervention in order to improve the following deficits and impairments:  Decreased strength, Impaired flexibility, Decreased activity tolerance, Impaired perceived functional ability, Pain, Decreased endurance, Difficulty walking, Abnormal gait, Decreased knowledge of use of DME, Decreased skin integrity, Decreased range of motion, Impaired sensation, Improper body mechanics, Obesity, Postural dysfunction, Increased edema, Decreased mobility, Decreased balance, Cardiopulmonary status limiting activity,  Decreased coordination  Visit Diagnosis: 1. Muscle weakness (generalized)   2. Chronic bilateral low back pain, unspecified whether sciatica present   3. Difficulty in walking, not elsewhere classified   4. History of falling        Problem List Patient Active Problem List   Diagnosis Date Noted  . Lower extremity edema 07/31/2018  . Pressure injury of skin 07/24/2018  . Surgical site infection 07/23/2018  . Wound of left leg 07/23/2018  . Atherosclerosis of artery of extremity with rest pain ([place]) 07/08/2018  . Shortness of breath 06/02/2018  . Bruit 06/02/2018  . Coronary artery disease of native artery of native heart with stable angina pectoris ([place]) 06/02/2018  . Atherosclerosis of native arteries of extremity with intermittent claudication ([place]) 05/19/2018  . Peripheral arterial disease ([place]) 05/06/2018  . Personal history of colonic polyps   . Aortic stenosis 12/24/2017  . LVH (left ventricular hypertrophy) 12/24/2017  . Hypertension with heart disease 12/24/2017  . Left atrial dilatation 12/12/2017  . Cardiac murmur 12/12/2017  . Gastrojejunal ulcer 12/04/2017  . Hyperphosphatemia 12/04/2017  . Spinal stenosis of lumbar region 06/18/2017  . Degeneration of lumbar intervertebral disc 06/02/2017  . Assistance needed with transportation 05/26/2017  . Financial difficulties 05/26/2017  . Needs assistance with community resources 05/26/2017  . Moderate recurrent  major depression ([place]) 05/26/2017  . Non-healing wound of lower extremity 02/06/2017  . Status post ankle arthrodesis 12/11/2016  . Controlled substance agreement broken 10/11/2016  . Low back pain 09/25/2016  . Osteoarthritis of right subtalar joint 07/11/2016  . Posterior tibial tendinitis of right lower extremity 07/11/2016  . Breast cancer screening 06/18/2016  . Knee pain 04/03/2016  . Hyponatremia 03/01/2016  . High triglycerides 12/24/2015  . Pes planus of both feet 11/16/2015  . Plantar fasciitis of right foot 11/16/2015  . Heel spur 11/16/2015  . Diabetic neuropathy ([place]) 11/16/2015  . Right ankle pain 11/16/2015  . Medication monitoring encounter 11/16/2015  . Chronic pain of multiple joints 08/15/2015  . Abnormal mammogram of right breast 06/19/2015  . Cerumen impaction 03/16/2015  . Hyperlipidemia 12/14/2014  . History of transfusion of packed RBC 12/14/2014  . Hx of smoking 12/14/2014  . Status post bariatric surgery 12/14/2014  . Morbid obesity ([place]) 12/14/2014  . Chronic radicular lumbar pain 12/14/2014  . History of GI bleed 11/12/2014  . GERD without esophagitis 11/12/2014  . History of small bowel obstruction 09/07/2014    Everlean Alstrom. [name], PT, DPT 12/17/18, 2:26 PM  [facility] [address], [place], [address] Phone: [phone]   Fax:  [phone]  Name: Andrea Holden MRN: [id number] Date of Birth: [birth year]

## 2018-12-18 ENCOUNTER — Encounter: Payer: Self-pay | Admitting: Nurse Practitioner

## 2018-12-18 ENCOUNTER — Ambulatory Visit (INDEPENDENT_AMBULATORY_CARE_PROVIDER_SITE_OTHER): Payer: Medicare Other | Admitting: Nurse Practitioner

## 2018-12-18 VITALS — BP 122/70 | HR 85 | Temp 98.8°F | Resp 14 | Ht 61.0 in | Wt 207.1 lb

## 2018-12-18 DIAGNOSIS — Z5181 Encounter for therapeutic drug level monitoring: Secondary | ICD-10-CM

## 2018-12-18 DIAGNOSIS — M170 Bilateral primary osteoarthritis of knee: Secondary | ICD-10-CM | POA: Diagnosis not present

## 2018-12-18 DIAGNOSIS — E782 Mixed hyperlipidemia: Secondary | ICD-10-CM | POA: Diagnosis not present

## 2018-12-18 DIAGNOSIS — E1142 Type 2 diabetes mellitus with diabetic polyneuropathy: Secondary | ICD-10-CM

## 2018-12-18 DIAGNOSIS — I739 Peripheral vascular disease, unspecified: Secondary | ICD-10-CM

## 2018-12-18 MED ORDER — GLUCOSAMINE 1500 COMPLEX PO CAPS: 1.0000 | ORAL_CAPSULE | Freq: Every day | ORAL | 1 refills | Status: DC

## 2018-12-18 NOTE — Progress Notes (Signed)
Name: Andrea Holden   MRN: [id number]    DOB: 18-Aug-1945   Date:12/18/2018       Progress Note  Subjective  Chief Complaint  Chief Complaint  Patient presents with  . Follow-up    HPI  Peripheral artery disease Has stent placed in left in February and right was earlier this month. Sees Dr. Lucky Cowboy for this and is doing well considering. States she can feel her leg and foot more and is using cane and is getting along well.   Lab Results  Component Value Date   CHOL 132 06/05/2018   HDL 45 (L) 06/05/2018   LDLCALC 66 06/05/2018   TRIG 133 06/05/2018   CHOLHDL 2.9 06/05/2018   Diabetes with neuropathy States she had no feeling in her legs prior to surgery but now neuropathy pain has returned some. Her diabetes is diet controlled. She is on ACEi and statin. Sees eye doctor annually.  Lab Results  Component Value Date   HGBA1C 6.8 (H) 04/23/2018   Hyperlipidemia Takes atorvastatin 10mg  daily as prescribed. Denies myalgias.  Lab Results  Component Value Date   CHOL 132 06/05/2018   HDL 45 (L) 06/05/2018   LDLCALC 66 06/05/2018   TRIG 133 06/05/2018   CHOLHDL 2.9 06/05/2018   Patient wants to discuss going on collagen and ketofast supplements to help with osteoarthritis of bilateral knees and morbid obesity. She read in a magazine that collagen supplements can help with her knee pain. She does not know about the ingredients in ketofast.   PHQ2/9: Depression screen [facility] 2/9 12/18/2018 11/19/2018 11/02/2018 08/04/2018 06/05/2018  Decreased Interest 0 0 0 0 3  Down, Depressed, Hopeless 0 0 1 3 3   PHQ - 2 Score 0 0 1 3 6   Altered sleeping 0 - 0 0 0  Tired, decreased energy 0 - 1 1 0  Change in appetite 0 - 0 3 0  Feeling bad or failure about yourself  0 - 0 3 1  Trouble concentrating 0 - 0 1 1  Moving slowly or fidgety/restless 0 - 0 0 0  Suicidal thoughts 0 - 0 0 0  PHQ-9 Score 0 - 2 11 8   Difficult doing work/chores Not difficult at all - Not difficult at all Not difficult at  all Not difficult at all  Some recent data might be hidden     PHQ reviewed. Negative  Patient Active Problem List   Diagnosis Date Noted  . Lower extremity edema 07/31/2018  . Pressure injury of skin 07/24/2018  . Surgical site infection 07/23/2018  . Wound of left leg 07/23/2018  . Atherosclerosis of artery of extremity with rest pain ([place]) 07/08/2018  . Shortness of breath 06/02/2018  . Bruit 06/02/2018  . Coronary artery disease of native artery of native heart with stable angina pectoris ([place]) 06/02/2018  . Atherosclerosis of native arteries of extremity with intermittent claudication ([place]) 05/19/2018  . Peripheral arterial disease ([place]) 05/06/2018  . Personal history of colonic polyps   . Aortic stenosis 12/24/2017  . LVH (left ventricular hypertrophy) 12/24/2017  . Hypertension with heart disease 12/24/2017  . Left atrial dilatation 12/12/2017  . Cardiac murmur 12/12/2017  . Gastrojejunal ulcer 12/04/2017  . Hyperphosphatemia 12/04/2017  . Spinal stenosis of lumbar region 06/18/2017  . Degeneration of lumbar intervertebral disc 06/02/2017  . Assistance needed with transportation 05/26/2017  . Financial difficulties 05/26/2017  . Needs assistance with community resources 05/26/2017  . Moderate recurrent major depression ([place]) 05/26/2017  . Non-healing wound  of lower extremity 02/06/2017  . Status post ankle arthrodesis 12/11/2016  . Controlled substance agreement broken 10/11/2016  . Low back pain 09/25/2016  . Osteoarthritis of right subtalar joint 07/11/2016  . Posterior tibial tendinitis of right lower extremity 07/11/2016  . Breast cancer screening 06/18/2016  . Knee pain 04/03/2016  . Hyponatremia 03/01/2016  . High triglycerides 12/24/2015  . Pes planus of both feet 11/16/2015  . Plantar fasciitis of right foot 11/16/2015  . Heel spur 11/16/2015  . Diabetic neuropathy (HCC) 11/16/2015  . Right ankle pain 11/16/2015  . Medication monitoring encounter  11/16/2015  . Chronic pain of multiple joints 08/15/2015  . Abnormal mammogram of right breast 06/19/2015  . Cerumen impaction 03/16/2015  . Hyperlipidemia 12/14/2014  . History of transfusion of packed RBC 12/14/2014  . Hx of smoking 12/14/2014  . Status post bariatric surgery 12/14/2014  . Morbid obesity (HCC) 12/14/2014  . Chronic radicular lumbar pain 12/14/2014  . History of GI bleed 11/12/2014  . GERD without esophagitis 11/12/2014  . History of small bowel obstruction 09/07/2014    Past Medical History:  Diagnosis Date  . Allergy    seasonal allergies  . Anemia 2014   needed 5 units of blood d/t passing out, weak  . Anxiety   . Aortic stenosis 12/24/2017   Echo Aug 2018  . Arthritis   . Asthma    allergy induced asthma  . Cardiac murmur 12/12/2017  . Cataract   . Cataract    left  . Complication of anesthesia    arrhythmia following colonoscopy  . Degenerative disc disease, lumbar   . Degenerative disc disease, lumbar   . Depression   . Diabetes mellitus   . Diabetic neuropathy (HCC) 11/16/2015  . Dysrhythmia    stenosis of left ventricle  . GERD (gastroesophageal reflux disease)   . H/O transfusion    [facility]  . History of chicken pox   . History of hiatal hernia   . History of measles, mumps, or rubella   . HOH (hard of hearing)    does not use hearing aides yet  . Hyperlipidemia   . Hypertension   . Irregular heartbeat   . LVH (left ventricular hypertrophy) 12/24/2017   Echo Aug 2018  . Neuropathy   . Opiate use 11/16/2015  . Peripheral arterial disease (HCC) 05/06/2018   At rest, left > right; refer to vasc  . Pes planus of both feet 11/16/2015  . Plantar fasciitis of right foot 11/16/2015  . Wheezing     Past Surgical History:  Procedure Laterality Date  . ABDOMINAL HYSTERECTOMY    . APPLICATION OF WOUND VAC Left 07/23/2018   Procedure: APPLICATION OF WOUND VAC;  Surgeon: Annice Needyew, Jason S, MD;   Location: [facility] ORS;  Service: Vascular;  Laterality: Left;  . banck injections    . CATARACT EXTRACTION W/PHACO Right 05/30/2015   Procedure: CATARACT EXTRACTION PHACO AND INTRAOCULAR LENS PLACEMENT (IOC);  Surgeon: Galen ManilaWilliam Porfilio, MD;  Location: [facility] OR[facility];  Service: Ophthalmology;  Laterality: Right;  US 00:57 AP% 20.9 CDE 11.99 fluid pack lot #[id number]  . CHOLECYSTECTOMY  1970  . COLON SURGERY  2013   blocked colon  . COLONOSCOPY WITH PROPOFOL N/A 01/20/2018   Procedure: COLONOSCOPY WITH PROPOFOL;  Surgeon: Midge MiniumWohl, Darren, MD;  [facility];  Service: Endoscopy;  Laterality: N/A;  . ENDARTERECTOMY FEMORAL Left 07/08/2018   Procedure: ENDARTERECTOMY FEMORAL;  Surgeon: Wyn Quakerew,  Marlow BaarsJason S, MD;  Location: [facility] ORS;  Service: Vascular;  Laterality: Left;  . EYE SURGERY Right    cataract surgery  . FOOT FUSION Right 2018   metal in foot  . GALLBLADDER SURGERY  1970  . GASTRIC BYPASS  2010   lost 178 lbs and regained 40 lbs last few years  . HUMERUS FRACTURE SURGERY Right    metal plate with screws  . internal bleeding  2016   ulcer in past  . LOWER EXTREMITY ANGIOGRAM Left 07/08/2018   Procedure: LOWER EXTREMITY ANGIOGRAM;  Surgeon: Annice Needyew, Jason S, MD;  Location: [facility] OR[facility];  Service: Vascular;  Laterality: Left;  . LOWER EXTREMITY ANGIOGRAPHY Left 05/27/2018   Procedure: LOWER EXTREMITY ANGIOGRAPHY;  Surgeon: Annice Needyew, Jason S, MD;  Location: [facility] IN[facility]IVE [facility];  Service: Cardiovascular;  Laterality: Left;  . LOWER EXTREMITY ANGIOGRAPHY Right 12/07/2018   Procedure: LOWER EXTREMITY ANGIOGRAPHY;  Surgeon: Annice Needyew, Jason S, MD;  Location: [facility] IN[facility] CV [facility];  Service: Cardiovascular;  Laterality: Right;  . MOUTH SURGERY     root canals and crowns and extractions  . OVARY SURGERY    . SKIN GRAFT Right 2018   RT foot. foot has been rebuilt.  it is full of metal  . TENOTOMY ACHILLES TENDON Right    Percuntaneous. metal in foot  . TONSILLECTOMY    . WOUND DEBRIDEMENT Left 07/23/2018   Procedure:  DEBRIDEMENT WOUND;  Surgeon: Annice Needyew, Jason S, MD;  Location: [facility] OR[facility];  Service: Vascular;  Laterality: Left;    Social History   Tobacco Use  . Smoking status: Current Some Day Smoker    Packs/day: 1.00    Years: 55.00    Pack years: 55.00    Types: Cigarettes    Start date: 07/01/1960    Last attempt to quit: 11/22/2018    Years since quitting: 0.0  . Smokeless tobacco: Never Used  . Tobacco comment: trying to quit again, chantix is on the way  Substance Use Topics  . Alcohol use: Yes    Alcohol/week: 0.0 standard drinks    Comment: 2 drinks per month     Current Outpatient Medications:  .  acetaminophen (TYLENOL) 325 MG tablet, Take 325 mg by mouth every 6 (six) hours as needed. , Disp: , Rfl:  .  albuterol (PROVENTIL HFA;VENTOLIN HFA) 108 (90 Base) MCG/ACT inhaler, Inhale 2 puffs into the lungs every 6 (six) hours as needed for wheezing or shortness of breath., Disp: 1 Inhaler, Rfl: 1 .  atorvastatin (LIPITOR) 10 MG tablet, Take 1 tablet (10 mg total) by mouth at bedtime., Disp: 90 tablet, Rfl: 1 .  calcium carbonate (TUMS - DOSED IN MG ELEMENTAL CALCIUM) 500 MG chewable tablet, Chew 3 tablets by mouth daily. Takes as she remembers, Disp: , Rfl:  .  clopidogrel (PLAVIX) 75 MG tablet, TAKE 1 TABLET BY MOUTH ONCE DAILY, Disp: 90 tablet, Rfl: 0 .  co-enzyme Q-10 30 MG capsule, Take 100 mg by mouth daily. , Disp: , Rfl:  .  fluticasone (FLONASE) 50 MCG/ACT nasal spray, Place 2 sprays into both nostrils daily as needed., Disp: 16 g, Rfl: 4 .  gabapentin (NEURONTIN) 400 MG capsule, Take 1 capsule (400 mg total) by mouth 2 (two) times daily., Disp: 180 capsule, Rfl: 1 .  lisinopril (ZESTRIL) 10 MG tablet, Take 1 tablet (10 mg total) by mouth daily., Disp: 90 tablet, Rfl: 1 .  Melatonin 5 MG TABS, Take 1 tablet by mouth at bedtime. , Disp: , Rfl:  .  Multiple Vitamins-Minerals (MULTIVITAMIN ADULTS 50+) TABS, Take 1 tablet by mouth daily., Disp: 90 tablet, Rfl: 1 .  pantoprazole (PROTONIX) 20  MG tablet, TAKE 1 TABLET BY MOUTH ONCE DAILY, Disp: 30 tablet, Rfl: 0 .  sertraline (ZOLOFT) 100 MG tablet, Take 1.5 tablets (150 mg total) by mouth daily., Disp: 135 tablet, Rfl: 1 .  silver sulfADIAZINE (SILVADENE) 1 % cream, Apply 1 application topically daily., Disp: 50 g, Rfl: 0 .  varenicline (CHANTIX STARTING MONTH PAK) 0.5 MG X 11 & 1 MG X 42 tablet, Take 0.5 mg by mouth once daily for 3 days, then 0.5 mg twice daily for 4 days, then one 1 mg tablet twice daily, Disp: 53 tablet, Rfl: 0 .  vitamin C (ASCORBIC ACID) 500 MG tablet, Take 500 mg by mouth daily. , Disp: , Rfl:  .  CHANTIX 1 MG tablet, , Disp: , Rfl:  .  HYDROcodone-acetaminophen (NORCO) 5-325 MG tablet, Take 0.5 tablets by mouth every 6 (six) hours as needed for moderate pain. (Patient not taking: Reported on 12/18/2018), Disp: 30 tablet, Rfl: 0  Allergies  Allergen Reactions  . Meloxicam Other (See Comments)    GI bleeding  . Nsaids Other (See Comments)    Gi bleeding  . Sitagliptin Hives and Itching    Januvia     Review of Systems  Constitutional: Negative for chills and fever.  Eyes: Negative for blurred vision and double vision.  Respiratory: Negative for cough and shortness of breath.   Cardiovascular: Negative for chest pain and palpitations.  Gastrointestinal: Negative for abdominal pain, blood in stool and heartburn.  Genitourinary: Negative for dysuria.  Musculoskeletal: Positive for joint pain. Negative for myalgias.  Neurological: Positive for tingling. Negative for dizziness, focal weakness and headaches.  Psychiatric/Behavioral: Negative for depression. The patient is not nervous/anxious.       No other specific complaints in a complete review of systems (except as listed in HPI above).  Objective  Vitals:   12/18/18 1332  BP: 122/70  Pulse: 85  Resp: 14  Temp: 98.8 F (37.1 C)  TempSrc: Oral  SpO2: 94%  Weight: 207 lb 1.6 oz (93.9 kg)  Height:  (1.549 m)    Body mass index is  39.13 kg/m.  Nursing Note and Vital Signs reviewed.  Physical Exam Constitutional:      Appearance: Normal appearance.  HENT:     Head: Normocephalic and atraumatic.  Eyes:     Conjunctiva/sclera: Conjunctivae normal.  Cardiovascular:     Rate and Rhythm: Normal rate.  Pulmonary:     Effort: Pulmonary effort is normal.  Musculoskeletal:     Comments: Uses cane   Skin:    General: Skin is dry.     Findings: No rash.  Neurological:     General: No focal deficit present.     Mental Status: She is alert and oriented to person, place, and time.  Psychiatric:        Mood and Affect: Mood normal.        Behavior: Behavior normal.      No results found for this or any previous visit (from the past 48 hour(s)).  Assessment & Plan  1. Peripheral arterial disease (HCC) Follow up with Dr. Wyn Quaker - Lipid Profile  2. Diabetic polyneuropathy associated with type 2 diabetes mellitus (HCC) Consider GLP for weight loss  - HgB A1c  3. Mixed hyperlipidemia - Lipid Profile  4. Osteoarthritis of both knees, unspecified osteoarthritis type Unsure of benefits of  collagen; some modest evidence with glucosamine  - Glucosamine-Chondroit-Vit C-Mn (GLUCOSAMINE 1500 COMPLEX) CAPS; Take 1 capsule by mouth daily.  Dispense: 90 capsule; Refill: 1  5. Medication monitoring encounter - COMPLETE METABOLIC PANEL WITH GFR  6. Morbid obesity (HCC) Discussed healthy eating for weight loss. Do not recommend fad diet pills.  refer to bariatrics if wanted

## 2018-12-18 NOTE — Patient Instructions (Signed)
What You Need to Know About Osteoarthritis  Osteoarthritis is a type of arthritis that affects tissue that covers the ends of bones in joints (cartilage). Cartilage acts as a cushion between the bones and helps them move smoothly. Osteoarthritis results when cartilage in the joints gets worn down. Osteoarthritis is sometimes called "wear and tear" arthritis.  Osteoarthritis can affect any joint and can make movement painful. Hips, knees, fingers, and toes are some of the joints that are most often affected by osteoarthritis.  You may be more likely to develop osteoarthritis if:  · You are middle-aged or older.  · You are obese.  · You have injured a joint or had surgery on a joint.  · You have a family history of osteoarthritis.  How can osteoarthritis affect me?  Osteoarthritis can cause:  · Pain and swelling in your joint.  · Difficulty moving your joint.  · A grating or scraping feeling inside the joint when you move it.  · Popping or creaking sounds when you move.  This condition can make it harder to do things that you need or want to do each day. Osteoarthritis in a major joint, such as your knee or hip, can make it painful to walk or exercise. If you have osteoarthritis in your hands, you might not be able to grip items, twist your hand, or control small movements of your hands and fingers (fine motor skills).  Over time, osteoarthritis could cause you to be less physically active. Being less active increases your risk for other long-term (chronic) health problems, such as diabetes and heart disease.  What lifestyle changes can be made?  You can lessen the impact that osteoarthritis has on your daily life by:  · Switching to low-impact activities that do not put repeated pressure on your joints. For example, if you usually run or jog for exercise, try swimming or riding a bike instead.  · Staying active. Build up to at least 150 minutes of physical activity each week to keep your joints healthy and keep your  body strong.  · Losing weight. If you are overweight or obese, losing weight can take pressure off of your joints. If you need help with weight loss, talk with your health care provider or a diet and nutrition specialist (dietitian).  What other changes can be made?  You can also lessen the effect of osteoarthritis by:  · Using assistive devices. Sometimes a brace, wrap, splint, specialized glove, or cane can help. Talk with your health care provider or physical therapist about when and how to use these.  · Working with a physical therapist who can help you find ways to do your daily activities without harming your joints. A physical therapist can also teach you exercises and stretches to strengthen the muscles that support your joints.  · Treating pain and inflammation. Take over-the-counter and prescription medicines for pain and inflammation only as told by your health care provider. If directed, you may put ice on the affected joint:  ? If you have a removable assistive device, remove it as told by your health care provider.  ? Put ice in a plastic bag.  ? Place a towel between your skin and the bag.  ? Leave the ice on for 20 minutes, 2-3 times a day.  If other measures do not work, you may need joint surgery, such as joint replacement.  What can happen if changes are not made?  Osteoarthritis is a condition that gets worse over time (  a progressive condition). If you do not take steps to strengthen your body and to slow down the progress of the disease, your condition may get worse more quickly. Your joints may stiffen and become swollen, which will make them painful and hard to move.  Where to find more information  You can learn more about osteoarthritis from:  · The Arthritis Foundation: www.arthritis.org/about-arthritis/types/osteoarthritis  · National Institute of Arthritis and Musculoskeletal and Skin Diseases: www.niams.nih.gov/health_info/osteoarthritis/osteoarthritis_ff.asp  Contact a health care  provider if:  · You cannot do your normal activities comfortably.  · Your joint does not function at all.  · Your pain is interfering with your sleep.  · You are gaining weight.  · Your joint appears to be changing in shape, instead of just being swollen and sore.  Summary  · Osteoarthritis is a painful joint disease that gets worse over time.  · This condition can lead to other long-term (chronic) health problems.  · There are changes that you can make to slow down the progression of the disease.  This information is not intended to replace advice given to you by your health care provider. Make sure you discuss any questions you have with your health care provider.  Document Released: 02/06/2016 Document Revised: 02/08/2016 Document Reviewed: 02/06/2016  Elsevier Interactive Patient Education © 2019 [employer]

## 2018-12-19 LAB — LIPID PANEL
Cholesterol: 135 mg/dL (ref <200)
HDL: 41 mg/dL — ABNORMAL LOW (ref >=50)
LDL Cholesterol (Calc): 70 mg/dL (calc)
Non-HDL Cholesterol (Calc): 94 mg/dL (calc) (ref <130)
Total CHOL/HDL Ratio: 3.3 (calc) (ref <5.0)
Triglycerides: 161 mg/dL — ABNORMAL HIGH (ref <150)

## 2018-12-19 LAB — HEMOGLOBIN A1C
Hgb A1c MFr Bld: 6.9 % of total Hgb — ABNORMAL HIGH (ref <5.7)
Mean Plasma Glucose: 151 (calc)
eAG (mmol/L): 8.4 (calc)

## 2018-12-19 LAB — COMPLETE METABOLIC PANEL WITH GFR
AG Ratio: 1.5 (calc) (ref 1.0–2.5)
ALT: 7 U/L (ref 6–29)
AST: 14 U/L (ref 10–35)
Albumin: 3.9 g/dL (ref 3.6–5.1)
Alkaline phosphatase (APISO): 82 U/L (ref 37–153)
BUN/Creatinine Ratio: 19 (calc) (ref 6–22)
BUN: 18 mg/dL (ref 7–25)
CO2: 25 mmol/L (ref 20–32)
Calcium: 9.3 mg/dL (ref 8.6–10.4)
Chloride: 103 mmol/L (ref 98–110)
Creat: 0.97 mg/dL — ABNORMAL HIGH (ref 0.60–0.93)
GFR, Est African American: 68 mL/min/{1.73_m2} (ref >=60)
GFR, Est Non African American: 59 mL/min/{1.73_m2} — ABNORMAL LOW (ref >=60)
Globulin: 2.6 g/dL (calc) (ref 1.9–3.7)
Glucose, Bld: 135 mg/dL — ABNORMAL HIGH (ref 65–99)
Potassium: 4.9 mmol/L (ref 3.5–5.3)
Sodium: 139 mmol/L (ref 135–146)
Total Bilirubin: 0.2 mg/dL (ref 0.2–1.2)
Total Protein: 6.5 g/dL (ref 6.1–8.1)

## 2018-12-22 ENCOUNTER — Ambulatory Visit: Payer: Medicare Other | Admitting: Physical Therapy

## 2018-12-24 ENCOUNTER — Encounter: Payer: Self-pay | Admitting: Physical Therapy

## 2018-12-24 ENCOUNTER — Ambulatory Visit: Payer: Medicare Other | Admitting: Physical Therapy

## 2018-12-24 ENCOUNTER — Other Ambulatory Visit: Payer: Self-pay

## 2018-12-24 VITALS — BP 146/70

## 2018-12-24 DIAGNOSIS — R262 Difficulty in walking, not elsewhere classified: Secondary | ICD-10-CM | POA: Diagnosis not present

## 2018-12-24 DIAGNOSIS — M6281 Muscle weakness (generalized): Secondary | ICD-10-CM

## 2018-12-24 DIAGNOSIS — M545 Low back pain, unspecified: Secondary | ICD-10-CM

## 2018-12-24 DIAGNOSIS — Z9181 History of falling: Secondary | ICD-10-CM | POA: Diagnosis not present

## 2018-12-24 DIAGNOSIS — G8929 Other chronic pain: Secondary | ICD-10-CM

## 2018-12-24 NOTE — Therapy (Signed)
[facility] [address], [place], [address] Phone: [phone]   Fax:  [phone]  Physical Therapy Treatment  Patient Details  Name: Andrea Holden MRN: [id number] Date of Birth: Sep 06, 1945 Referring Provider (PT): Fredderick Severance, NP    Encounter Date: 12/24/2018  PT End of Session - 12/24/18 1342    Visit Number  9    Number of Visits  24    Date for PT Re-Evaluation  02/09/19    Authorization Type  [facility] Medicare reporting period from 01/12/2019 (next reporting period from 12/15/2018)    Authorization Time Period  Current cert period 03/21/1940 - 02/09/2019 (latest PN: 12/15/2018)    Authorization - Visit Number  2    Authorization - Number of Visits  10    PT Start Time  1330    PT Stop Time  1415    PT Time Calculation (min)  45 min    Equipment Utilized During Treatment  Gait belt    Activity Tolerance  Patient tolerated treatment well;Patient limited by fatigue;Patient limited by pain    Behavior During Therapy  [facility] for tasks assessed/performed       Past Medical History:  Diagnosis Date  . Allergy    seasonal allergies  . Anemia 2014   needed 5 units of blood d/t passing out, weak  . Anxiety   . Aortic stenosis 12/24/2017   Echo Aug 2018  . Arthritis   . Asthma    allergy induced asthma  . Cardiac murmur 12/12/2017  . Cataract   . Cataract    left  . Complication of anesthesia    arrhythmia following colonoscopy  . Degenerative disc disease, lumbar   . Degenerative disc disease, lumbar   . Depression   . Diabetes mellitus   . Diabetic neuropathy ([place]) 11/16/2015  . Dysrhythmia    stenosis of left ventricle  . GERD (gastroesophageal reflux disease)   . H/O transfusion    patient was given 5 units of blood while at [place], blood type O+  . History of chicken pox   . History of hiatal hernia   . History of measles, mumps, or rubella   . HOH (hard of hearing)    does not use  hearing aides yet  . Hyperlipidemia   . Hypertension   . Irregular heartbeat   . LVH (left ventricular hypertrophy) 12/24/2017   Echo Aug 2018  . Neuropathy   . Opiate use 11/16/2015  . Peripheral arterial disease ([place]) 05/06/2018   At rest, left > right; refer to vasc  . Pes planus of both feet 11/16/2015  . Plantar fasciitis of right foot 11/16/2015  . Wheezing     Past Surgical History:  Procedure Laterality Date  . ABDOMINAL HYSTERECTOMY    . APPLICATION OF WOUND VAC Left 07/23/2018   Procedure: APPLICATION OF WOUND VAC;  Surgeon: Algernon Huxley, MD;  Location: [facility];  Service: Vascular;  Laterality: Left;  . banck injections    . CATARACT EXTRACTION W/PHACO Right 05/30/2015   Procedure: CATARACT EXTRACTION PHACO AND INTRAOCULAR LENS PLACEMENT (IOC);  Surgeon: Birder Robson, MD;  Location: [facility];  Service: Ophthalmology;  Laterality: Right;  [place] 00:57 AP% 20.9 CDE 11.99 fluid pack lot #[id number]  . CHOLECYSTECTOMY  1970  . COLON SURGERY  2013   blocked colon  . COLONOSCOPY WITH PROPOFOL N/A 01/20/2018   Procedure: COLONOSCOPY WITH PROPOFOL;  Surgeon: Lucilla Lame, MD;  Location:  [facility];  Service: Endoscopy;  Laterality: N/A;  . ENDARTERECTOMY FEMORAL Left 07/08/2018   Procedure: ENDARTERECTOMY FEMORAL;  Surgeon: Algernon Huxley, MD;  Location: [facility];  Service: Vascular;  Laterality: Left;  . EYE SURGERY Right    cataract surgery  . FOOT FUSION Right 2018   metal in foot  . GALLBLADDER SURGERY  1970  . GASTRIC BYPASS  2010   lost 178 lbs and regained 40 lbs last few years  . HUMERUS FRACTURE SURGERY Right    metal plate with screws  . internal bleeding  2016   ulcer in past  . LOWER EXTREMITY ANGIOGRAM Left 07/08/2018   Procedure: LOWER EXTREMITY ANGIOGRAM;  Surgeon: Algernon Huxley, MD;  Location: [facility];  Service: Vascular;  Laterality: Left;  . LOWER EXTREMITY ANGIOGRAPHY Left 05/27/2018   Procedure: LOWER EXTREMITY ANGIOGRAPHY;  Surgeon: Algernon Huxley, MD;   Location: [facility];  Service: Cardiovascular;  Laterality: Left;  . LOWER EXTREMITY ANGIOGRAPHY Right 12/07/2018   Procedure: LOWER EXTREMITY ANGIOGRAPHY;  Surgeon: Algernon Huxley, MD;  Location: [facility];  Service: Cardiovascular;  Laterality: Right;  . MOUTH SURGERY     root canals and crowns and extractions  . OVARY SURGERY    . SKIN GRAFT Right 2018   RT foot. foot has been rebuilt.  it is full of metal  . TENOTOMY ACHILLES TENDON Right    Percuntaneous. metal in foot  . TONSILLECTOMY    . WOUND DEBRIDEMENT Left 07/23/2018   Procedure: DEBRIDEMENT WOUND;  Surgeon: Algernon Huxley, MD;  Location: [facility];  Service: Vascular;  Laterality: Left;    Vitals:   12/24/18 1330  BP: (!) 146/70    Subjective Assessment - 12/24/18 1330    Subjective  Patient reports her back is "killing her" and she rates her pain about 5/10 in her back. She states her legs are doing pretty good but she feels like she is having trouble standing up straight. She states her HEP is not going well. She states she feels weak in her back and core so she has been struggling to complete her sit <> stands. She has done 15 one day but not up to 30.    Pertinent History  Patient is a 73 y.o. female who presents to outpatient physical therapy with a referral for medical diagnosis of degeneration of lumbar intervertebral disc. This patient's chief complaints consist of chronic low back pain and foot pain, weakness, stiffness, leading to the following functional deficits: difficulty with with basic ADLs, IADLs, ambulation. She used to get injections to help control back pain but it has recently worsened due to being unable to get injections during [place] pandemic. The patient has been informed of current processes in place at [facility]patient [facility]hab to protect patients from Covid-19 exposure including social distancing, schedule modifications, and new cleaning procedures. After discussing their particular risk with a  therapist based on the patient's personal risk factors, the patient has decided to proceed with in-person therapy. Pt is scheduled for R LE angiography on 12/07/2018. She has recently started smoking again with the stress of COVID19 pandemic and feels strongly she needs to stop. Surgeon has stated in the past he will not complete surgery if she is smoking.  Relevant past medical history and comorbidities include underwent extensive vascular surgeries in Jan 2020 to improve blood flow, similar surgery for R leg scheduled 12/07/2018. Has a history of left carotid artery stenosis, but not enough to have  surgery, has a history of multiple arterial surgeries, history of R cataract surgery, colon surgery, cholecystectomy, R foot skin graft, ovary surgery, gastric bypass, R foot fusion, abdominal hysterectomy; current smoker, plantar fasciitis, peripheral arterial disease, LVH, diabetes mellitus with neuropathy, irregular heartbeat, hiatal hernia, GERD, stenosis of left heart ventricle, depression, lumbar degenerative disc disease, cardiac murmur, asthma, anxiety, obesity, HTN, and seasonal allergies She has been advised not to lift over 10 pounds by physician    Limitations  Sitting;Standing;Walking;House hold activities;Lifting    How long can you sit comfortably?  <1 hour    How long can you stand comfortably?  1 minute    How long can you walk comfortably?  2 minutes, depends on what she has with her. Able to walk at fair with rollator. To mailbox 120 feet causes shaking in R leg.      Diagnostic tests  MRI lumbar spine report 06/14/2017: "IMPRESSION: 1. Stable degenerative of lumbar spondylosis and scoliosis with multilevel disc disease and facet disease. 2. Stable right foraminal stenosis at L5-S1 due to right-sided disc    Patient Stated Goals  patient would like to get back to PLOF, walking further, be better able to participate in dog showing and trips    Currently in Pain?  Yes    Pain Score  5     Pain  Location  Back    Pain Onset  More than a month ago       TREATMENT:   Therapeutic exercise:to centralize symptoms and improve ROM, strength, muscular endurance, and activity tolerance required for successful completion of functional activities. - blood pressure measurementmanually measured at the wrist toensure safety of exercise. Pt requested it be taken manually because she feels this get a more accurate BP.elevated but safe for exercise. (see above for values). -NuStep level2using bilateral upper and lower extremities. Seatsetting 8/handle setting7. For improved extremity mobility, muscular endurance, and activity tolerance; and to induce the analgesic effect of aerobic exercise, stimulate improved joint nutrition, and prepare body structures and systems for following interventions. x31mnuteswith SPM above 60 during subjective exam.  - sidelying hip abduction2x10L and 2x15 R. Required physical assistance and cuing to get in true sidelying. To improve single leg stance pelvis stability for ambulation. - supine bridgeswith red theraband around distal thighs,2x10with cuing for full ROM. To strengthen hip extension for functional exercise. Cuing for proper form. -Prone hip extension with knee flexed to preferentially bias gluteus maximus.2x10each side with 2# dumbbell behind kneeCuing to keep knee flexed.To improve functional strength. - Sit <> stand from low plinth with no UE support. 2x10 with supervision for safety. To improve LE strength, power, and endurance for functional mobility. Tired at end of each set.   - Education on [email or web address] Code: [email or web address] URL: [email or web address]  Date: 12/03/2018  Prepared by: SRosita Kea  Exercises   Sit to Stand without Arm Support - 30 reps - 2x daily   Standing Hip Hiking - 3 sets - 10 reps - 2x daily   Supine Bridge with Resistance Band - 3 sets - 10 reps - 1 second hold -  2x daily   Sidelying Hip Abduction - 3 sets - 10 reps - 2x daily   Prone Hip Extension with Bent Knee - 3 sets - 10 reps - 2x daily   Prone Hip Abduction on Slider - 3 sets - 10 reps - 2x daily       PT Education - 12/24/18 1340  Education provided  Yes    Education Details  Exercise purpose/form. Self management techniques. Education on diagnosis, prognosis, POC, anatomy and physiology of current condition    Person(s) Educated  Patient    Methods  Explanation;Demonstration;Tactile cues;Verbal cues    Comprehension  Verbalized understanding;Returned demonstration;Verbal cues required;Tactile cues required       PT Short Term Goals - 12/16/18 1710      PT SHORT TERM GOAL #1   Title  Be independent with initial home exercise program for self-management of symptoms.    Baseline  Initial HEP provided at IE (11/17/2018); was completing until vascular procedure, re-instated 12/15/2018 (12/15/2018);    Time  2    Period  Weeks    Status  Partially Met    Target Date  12/30/18        PT Long Term Goals - 12/17/18 1422      PT LONG TERM GOAL #1   Title  Be independent with a long-term home exercise program for self-management of symptoms.     Baseline  Initial HEP provided at [facility] (11/17/2018); was doing better before vascular surgery, is working at returning to [facility] since procedure (12/15/2018).    Time  12    Period  Weeks    Status  Partially Met    Target Date  02/09/19      PT LONG TERM GOAL #2   Title  Patient will improve 6MWT to equal or greater than 1000 feet with LRAD to demonstrate improved activity tolerance for community ambulation.     Baseline  6 Minute Walk Test: 200 feet with SPC and supervision. Stopped at 1:43 due to fatigue and leg pain. (11/17/2018); 457 feet SPC, on seated and one standing break (12/15/2018);    Time  12    Period  Weeks    Status  Partially Met    Target Date  02/09/19      PT LONG TERM GOAL #3   Title  Patient will demonstrate improved 5  Times Sit to Stand test to 11 seconds from chair height with no UE support to demonstrate improvement in LE power and functional strength for daily activities.    Baseline  5 Time Sit to Stand: 27 seconds from chair height no UE support (11/17/2018); 18 seconds, no UE support from chair-high plinth (12/15/2018);    Time  12    Period  Weeks    Status  Partially Met    Target Date  02/09/19      PT LONG TERM GOAL #4   Title  Patient will ambulate faster than 1.35ms on the 10 Meter Walk Test to improve ability to cross street safey for community participation.     Baseline  testing deferred to next session (11/17/2018); 1.37 m/sec with SPC (11/24/2018); 1.3 m/sec with SPC (12/15/2018);    Time  12    Period  Weeks    Status  Achieved    Target Date  02/09/19      PT LONG TERM GOAL #5   Title  Patient will score equal or greater than  25/30 on the Functional Gait Assessment to demonstrate low fall risk to improve her ability to participate safely in community activities.     Baseline  testing deferred to next session (11/17/2018); 11/30 high fall risk (11/24/2018); unable to measure due to time limitation (12/15/2018); 13/30 high fall risk (12/17/2018);    Time  12    Period  Weeks    Status  Partially Met    Target Date  02/09/19            Plan - 12/24/18 1423    Clinical Impression Statement  Patient required 10 minute bathroom break in the middle of her session. Patient tolerated treatment well with no overall increase in pain throughout session. She is making progress towards goals again and was able to return to progressive LE and funcitonal strengthening exercises today. Patient continued to complain of low back pain, R more than L, that did not seem to improve or worsen significantly with prone press ups. Patient required min A with bed mobility and a pillow under L hip when rolling over that side. She also required min A to guide legs during prone and sidelying strengthening activities.  Pt required multimodal cuing for proper technique and to facilitate improved neuromuscular control, strength, range of motion, and functional ability resulting in improved performance and form. Patient would benefit from continued physical therapy to address remaining impairments and functional limitations to work towards stated goals and return to PLOF or maximal functional independence    Personal Factors and Comorbidities  Age;Comorbidity 3+;Fitness;Time since onset of injury/illness/exacerbation;Past/Current Experience;Other   significant anxiety affecting wellbeing and wellness behaviors   Comorbidities  underwent extensive vascular surgeries in Jan 2020 to improve blood flow, similar surgery for R leg scheduled 12/07/2018. Has a history of left carotid artery stenosis, but not enough to have surgery, has a history of multiple arterial surgeries, history of R cataract surgery, colon surgery, cholecystectomy, R foot skin graft, ovary surgery, gastric bypass, R foot fusion, abdominal hysterectomy; current smoker, plantar fasciitis, peripheral arterial disease, LVH, diabetes mellitus with neuropathy, irregular heartbeat, hiatal hernia, GERD, stenosis of left heart ventricle, depression, lumbar degenerative disc disease, cardiac murmur, asthma, anxiety, obesity, HTN, and seasonal allergies She has been advised not to lift over 10 pounds by physician.      Examination-Activity Limitations  Bathing;Carry;Lift;Sit;Stand;Locomotion Level;Toileting;Dressing;Squat;Transfers;Stairs;Hygiene/Grooming;Other   ADLs, IADLs, and valued community and social activities including dog showing, transferring, ambulating household and community distances, squatting, stooping, dog handling/training, and stairs   Examination-Participation Restrictions  Community Activity;Volunteer;Cleaning;Interpersonal Relationship;Yard Social research officer, government, participation in Chartered certified accountant   Stability/Clinical Decision Making   Evolving/Moderate complexity    Rehab Potential  Fair    Clinical Impairments Affecting Rehab Potential  (+) motivated (-) muliple co morbidities, chronic condition    PT Frequency  2x / week    PT Duration  12 weeks    PT Treatment/Interventions  Moist Heat;Patient/family education;Neuromuscular re-education;Therapeutic exercise;Manual techniques;ADLs/Self Care Home Management;Electrical Stimulation;Gait training;Stair training;Functional mobility training;Therapeutic activities;Balance training;Passive range of motion;Dry needling;Energy conservation;Spinal Manipulations;Joint Manipulations;Cryotherapy   joint mobilizations grades I-IV   PT Next Visit Plan  continue with progressive functional strengtheing    PT Home Exercise Plan  Medbridge Access Code: [email or web address]    Consulted and Agree with Plan of Care  Patient       Patient will benefit from skilled therapeutic intervention in order to improve the following deficits and impairments:  Decreased strength, Impaired flexibility, Decreased activity tolerance, Impaired perceived functional ability, Pain, Decreased endurance, Difficulty walking, Abnormal gait, Decreased knowledge of use of DME, Decreased skin integrity, Decreased range of motion, Impaired sensation, Improper body mechanics, Obesity, Postural dysfunction, Increased edema, Decreased mobility, Decreased balance, Cardiopulmonary status limiting activity, Decreased coordination  Visit Diagnosis: 1. Muscle weakness (generalized)   2. Chronic bilateral low back pain, unspecified whether sciatica present   3. Difficulty in walking, not elsewhere classified   4.  History of falling        Problem List Patient Active Problem List   Diagnosis Date Noted  . Lower extremity edema 07/31/2018  . Pressure injury of skin 07/24/2018  . Surgical site infection 07/23/2018  . Wound of left leg 07/23/2018  . Atherosclerosis of artery of extremity with rest pain ([place]) 07/08/2018  . Shortness  of breath 06/02/2018  . Bruit 06/02/2018  . Coronary artery disease of native artery of native heart with stable angina pectoris ([place]) 06/02/2018  . Atherosclerosis of native arteries of extremity with intermittent claudication ([place]) 05/19/2018  . Peripheral arterial disease ([place]) 05/06/2018  . Personal history of colonic polyps   . Aortic stenosis 12/24/2017  . LVH (left ventricular hypertrophy) 12/24/2017  . Hypertension with heart disease 12/24/2017  . Left atrial dilatation 12/12/2017  . Cardiac murmur 12/12/2017  . Gastrojejunal ulcer 12/04/2017  . Hyperphosphatemia 12/04/2017  . Spinal stenosis of lumbar region 06/18/2017  . Degeneration of lumbar intervertebral disc 06/02/2017  . Assistance needed with transportation 05/26/2017  . Financial difficulties 05/26/2017  . Needs assistance with community resources 05/26/2017  . Moderate recurrent major depression ([place]) 05/26/2017  . Non-healing wound of lower extremity 02/06/2017  . Status post ankle arthrodesis 12/11/2016  . Controlled substance agreement broken 10/11/2016  . Low back pain 09/25/2016  . Osteoarthritis of right subtalar joint 07/11/2016  . Posterior tibial tendinitis of right lower extremity 07/11/2016  . Breast cancer screening 06/18/2016  . Knee pain 04/03/2016  . Hyponatremia 03/01/2016  . High triglycerides 12/24/2015  . Pes planus of both feet 11/16/2015  . Plantar fasciitis of right foot 11/16/2015  . Heel spur 11/16/2015  . Diabetic neuropathy ([place]) 11/16/2015  . Right ankle pain 11/16/2015  . Medication monitoring encounter 11/16/2015  . Chronic pain of multiple joints 08/15/2015  . Abnormal mammogram of right breast 06/19/2015  . Cerumen impaction 03/16/2015  . Hyperlipidemia 12/14/2014  . History of transfusion of packed RBC 12/14/2014  . Hx of smoking 12/14/2014  . Status post bariatric surgery 12/14/2014  . Morbid obesity ([place]) 12/14/2014  . Chronic radicular lumbar pain 12/14/2014  . History  of GI bleed 11/12/2014  . GERD without esophagitis 11/12/2014  . History of small bowel obstruction 09/07/2014    Everlean Alstrom. [name], PT, DPT 12/24/18, 2:24 PM  [facility] [address], [place], [address] Phone: [phone]   Fax:  [phone]  Name: Andrea Holden MRN: [id number] Date of Birth: 07/17/1945

## 2018-12-28 DIAGNOSIS — M545 Low back pain: Secondary | ICD-10-CM | POA: Diagnosis not present

## 2018-12-29 ENCOUNTER — Ambulatory Visit: Payer: Medicare Other | Admitting: Physical Therapy

## 2018-12-29 ENCOUNTER — Ambulatory Visit: Payer: Self-pay | Admitting: *Deleted

## 2018-12-29 ENCOUNTER — Encounter: Payer: Self-pay | Admitting: Physical Therapy

## 2018-12-29 ENCOUNTER — Other Ambulatory Visit: Payer: Self-pay

## 2018-12-29 VITALS — BP 108/60

## 2018-12-29 DIAGNOSIS — R262 Difficulty in walking, not elsewhere classified: Secondary | ICD-10-CM | POA: Diagnosis not present

## 2018-12-29 DIAGNOSIS — Z598 Other problems related to housing and economic circumstances: Secondary | ICD-10-CM

## 2018-12-29 DIAGNOSIS — Z9181 History of falling: Secondary | ICD-10-CM

## 2018-12-29 DIAGNOSIS — M6281 Muscle weakness (generalized): Secondary | ICD-10-CM | POA: Diagnosis not present

## 2018-12-29 DIAGNOSIS — Z599 Problem related to housing and economic circumstances, unspecified: Secondary | ICD-10-CM

## 2018-12-29 DIAGNOSIS — G8929 Other chronic pain: Secondary | ICD-10-CM | POA: Diagnosis not present

## 2018-12-29 DIAGNOSIS — Z789 Other specified health status: Secondary | ICD-10-CM

## 2018-12-29 DIAGNOSIS — M545 Low back pain: Secondary | ICD-10-CM | POA: Diagnosis not present

## 2018-12-29 NOTE — Chronic Care Management (AMB) (Signed)
  Chronic Care Management    Clinical Social Work Follow Up Note  12/29/2018 Name: NYJAH SCHWAKE MRN: [id number] DOB: Nov 05, 1945  Lizzet J Slingerland is a 73 y.o. year old female who is a primary care patient of Lada, Satira Anis, MD. The CCM team was consulted for assistance with food stamp applicaton.   Review of patient status, including review of consultants reports, other relevant assessments, and collaboration with appropriate care team members and the patient's provider was performed as part of comprehensive patient evaluation and provision of chronic care management services.     Goals Addressed            This Visit's Progress   . I need help applying for food stamps (pt-stated)       Current Barriers:  . Financial constraints . Limited social support . Family and relationship dysfunction . Social Isolation  Clinical Social Work Clinical Goal(s):  Marland Kitchen Over the next 30 days, client will work with SW to address concerns related to applying for food stamps -completed  Interventions: . Patient interviewed and appropriate assessments performed . Confirmed that patient has contacted the Department of Social Services and it has been determined that she has been approved for food stamps as of 12/02/2018 and her card has been mailed to her home. She clarified that she received $388.00 instead of the $16.00 . Patient discussed starting Chantix last week and has no desire to finish the last 2 cigarettes that she has.  . Positive reinforcement and assurance provided in response to patient's smoking cessation efforts.  . Discussed plans with patient for ongoing care management follow up and provided patient with direct contact information for care management team   Patient Self Care Activities:  . Self administers medications as prescribed . Attends all scheduled provider appointments . Performs ADL's independently . Performs IADL's independently  Please see past updates related to  this goal by clicking on the "Past Updates" button in the selected goal          Follow Up Plan: Client will follow up with this social worker as needed   Elliot Gurney, [place] Care Management [phone]

## 2018-12-29 NOTE — Patient Instructions (Signed)
Thank you allowing the Chronic Care Management Team to be a part of your care! It was a pleasure speaking with you today!  1. Please call this social worker with questions related to your community resource needs.   CCM (Chronic Care Management) Team   Trish Fountain RN, BSN Nurse Care Coordinator  [phone]  Ruben Reason PharmD  Clinical Pharmacist  [phone]   Elliot Gurney, LCSW Clinical Social Worker [phone]  Goals Addressed            This Visit's Progress   . I need help applying for food stamps (pt-stated)       Current Barriers:  . Financial constraints . Limited social support . Family and relationship dysfunction . Social Isolation  Clinical Social Work Clinical Goal(s):  Marland Kitchen Over the next 30 days, client will work with SW to address concerns related to applying for food stamps -completed  Interventions: . Patient interviewed and appropriate assessments performed . Confirmed that patient has contacted the Department of Social Services and it has been determined that she has been approved for food stamps as of 12/02/2018 and her card has been mailed to her home. She clarified that she received $388.00 instead of the $16.00 . Patient discussed starting Chantix last week and has no desire to finish the last 2 cigarettes that she has.  . Positive reinforcement and assurance provided in response to patient's smoking cessation efforts.  . Discussed plans with patient for ongoing care management follow up and provided patient with direct contact information for care management team   Patient Self Care Activities:  . Self administers medications as prescribed . Attends all scheduled provider appointments . Performs ADL's independently . Performs IADL's independently  Please see past updates related to this goal by clicking on the "Past Updates" button in the selected goal          The patient verbalized understanding of instructions provided today  and declined a print copy of patient instruction materials.   Telephone follow up appointment with care management team member scheduled for:1 month

## 2018-12-29 NOTE — Therapy (Signed)
[facility] [address] [place], [address] Phone: [phone]   Fax:  [phone]  Physical Therapy Treatment  Patient Details  Name: Andrea Holden MRN: [id number] Date of Birth: 08/20/1945 Referring Provider (PT): Fredderick Severance, NP    Encounter Date: 12/29/2018  PT End of Session - 12/29/18 1834    Visit Number  10    Number of Visits  24    Date for PT Re-Evaluation  02/09/19    Authorization Type  [facility] Medicare reporting period from 12/15/2018    Authorization Time Period  Current cert period 03/16/3845 - 02/09/2019 (latest PN: 12/15/2018)    Authorization - Visit Number  3    Authorization - Number of Visits  10    PT Start Time  1330    PT Stop Time  1415    PT Time Calculation (min)  45 min    Equipment Utilized During Treatment  --    Activity Tolerance  Patient tolerated treatment well;Patient limited by fatigue;Patient limited by pain    Behavior During Therapy  [facility] for tasks assessed/performed       Past Medical History:  Diagnosis Date  . Allergy    seasonal allergies  . Anemia 2014   needed 5 units of blood d/t passing out, weak  . Anxiety   . Aortic stenosis 12/24/2017   Echo Aug 2018  . Arthritis   . Asthma    allergy induced asthma  . Cardiac murmur 12/12/2017  . Cataract   . Cataract    left  . Complication of anesthesia    arrhythmia following colonoscopy  . Degenerative disc disease, lumbar   . Degenerative disc disease, lumbar   . Depression   . Diabetes mellitus   . Diabetic neuropathy ([place]) 11/16/2015  . Dysrhythmia    stenosis of left ventricle  . GERD (gastroesophageal reflux disease)   . H/O transfusion    patient was given 5 units of blood while at [place], blood type O+  . History of chicken pox   . History of hiatal hernia   . History of measles, mumps, or rubella   . HOH (hard of hearing)    does not use hearing aides yet  . Hyperlipidemia   .  Hypertension   . Irregular heartbeat   . LVH (left ventricular hypertrophy) 12/24/2017   Echo Aug 2018  . Neuropathy   . Opiate use 11/16/2015  . Peripheral arterial disease ([place]) 05/06/2018   At rest, left > right; refer to vasc  . Pes planus of both feet 11/16/2015  . Plantar fasciitis of right foot 11/16/2015  . Wheezing     Past Surgical History:  Procedure Laterality Date  . ABDOMINAL HYSTERECTOMY    . APPLICATION OF WOUND VAC Left 07/23/2018   Procedure: APPLICATION OF WOUND VAC;  Surgeon: Algernon Huxley, MD;  Location: [facility] ORS;  Service: Vascular;  Laterality: Left;  . banck injections    . CATARACT EXTRACTION W/PHACO Right 05/30/2015   Procedure: CATARACT EXTRACTION PHACO AND INTRAOCULAR LENS PLACEMENT (IOC);  Surgeon: Birder Robson, MD;  Location: [facility] ORS;  Service: Ophthalmology;  Laterality: Right;  [place] 00:57 AP% 20.9 CDE 11.99 fluid pack lot #[id number]  . CHOLECYSTECTOMY  1970  . COLON SURGERY  2013   blocked colon  . COLONOSCOPY WITH PROPOFOL N/A 01/20/2018   Procedure: COLONOSCOPY WITH PROPOFOL;  Surgeon: Lucilla Lame, MD;  Location: [facility]COPY;  Service: Endoscopy;  Laterality: N/A;  . ENDARTERECTOMY FEMORAL Left 07/08/2018   Procedure: ENDARTERECTOMY FEMORAL;  Surgeon: Algernon Huxley, MD;  Location: [facility] ORS;  Service: Vascular;  Laterality: Left;  . EYE SURGERY Right    cataract surgery  . FOOT FUSION Right 2018   metal in foot  . GALLBLADDER SURGERY  1970  . GASTRIC BYPASS  2010   lost 178 lbs and regained 40 lbs last few years  . HUMERUS FRACTURE SURGERY Right    metal plate with screws  . internal bleeding  2016   ulcer in past  . LOWER EXTREMITY ANGIOGRAM Left 07/08/2018   Procedure: LOWER EXTREMITY ANGIOGRAM;  Surgeon: Algernon Huxley, MD;  Location: [facility] ORS;  Service: Vascular;  Laterality: Left;  . LOWER EXTREMITY ANGIOGRAPHY Left 05/27/2018   Procedure: LOWER EXTREMITY ANGIOGRAPHY;  Surgeon: Algernon Huxley, MD;  Location: [place] CV LAB;  Service:  Cardiovascular;  Laterality: Left;  . LOWER EXTREMITY ANGIOGRAPHY Right 12/07/2018   Procedure: LOWER EXTREMITY ANGIOGRAPHY;  Surgeon: Algernon Huxley, MD;  Location: [place] [facility];  Service: Cardiovascular;  Laterality: Right;  . MOUTH SURGERY     root canals and crowns and extractions  . OVARY SURGERY    . SKIN GRAFT Right 2018   RT foot. foot has been rebuilt.  it is full of metal  . TENOTOMY ACHILLES TENDON Right    Percuntaneous. metal in foot  . TONSILLECTOMY    . WOUND DEBRIDEMENT Left 07/23/2018   Procedure: DEBRIDEMENT WOUND;  Surgeon: Algernon Huxley, MD;  Location: [facility] ORS;  Service: Vascular;  Laterality: Left;    Vitals:   12/29/18 1338  BP: 108/60    Subjective Assessment - 12/29/18 1338    Subjective  Patient reports her pain is 7/10 in her back and neck upon arrival. She states she tripped two days ago going into her home and hit her R knee and R arm causing somse skin lacerations and swelling in her left knee. It has improved now and skin is healing. She had her teleconsult with the physician who does her lumbar injections and he wanted to do both sides. She is awainting a call to schedule it. She also has upcoming tooth surgery on 01/13/2019 and expects to put into twilight instead of general anesthesia. She was sore for a day following last treatment session. She is still working on quitting smoking. She has one cigarrette left and she does not plan to purchase any more. Reports she is completing her 30 sit <> stands each day as prescribed.    Pertinent History  Patient is a 73 y.o. female who presents to outpatient physical therapy with a referral for medical diagnosis of degeneration of lumbar intervertebral disc. This patient's chief complaints consist of chronic low back pain and foot pain, weakness, stiffness, leading to the following functional deficits: difficulty with with basic ADLs, IADLs, ambulation. She used to get injections to help control back pain but it has  recently worsened due to being unable to get injections during [place] pandemic. The patient has been informed of current processes in place at [facility]patient [facility] to protect patients from Covid-19 exposure including social distancing, schedule modifications, and new cleaning procedures. After discussing their particular risk with a therapist based on the patient's personal risk factors, the patient has decided to proceed with in-person therapy. Pt is scheduled for R LE angiography on 12/07/2018. She has recently started smoking again with the stress of COVID19 pandemic and feels strongly she needs  to stop. Surgeon has stated in the past he will not complete surgery if she is smoking.  Relevant past medical history and comorbidities include underwent extensive vascular surgeries in Jan 2020 to improve blood flow, similar surgery for R leg scheduled 12/07/2018. Has a history of left carotid artery stenosis, but not enough to have surgery, has a history of multiple arterial surgeries, history of R cataract surgery, colon surgery, cholecystectomy, R foot skin graft, ovary surgery, gastric bypass, R foot fusion, abdominal hysterectomy; current smoker, plantar fasciitis, peripheral arterial disease, LVH, diabetes mellitus with neuropathy, irregular heartbeat, hiatal hernia, GERD, stenosis of left heart ventricle, depression, lumbar degenerative disc disease, cardiac murmur, asthma, anxiety, obesity, HTN, and seasonal allergies She has been advised not to lift over 10 pounds by physician    Limitations  Sitting;Standing;Walking;House hold activities;Lifting    How long can you sit comfortably?  <1 hour    How long can you stand comfortably?  1 minute    How long can you walk comfortably?  2 minutes, depends on what she has with her. Able to walk at fair with rollator. To mailbox 120 feet causes shaking in R leg.      Diagnostic tests  MRI lumbar spine report 06/14/2017: "IMPRESSION: 1. Stable degenerative of lumbar  spondylosis and scoliosis with multilevel disc disease and facet disease. 2. Stable right foraminal stenosis at L5-S1 due to right-sided disc    Patient Stated Goals  patient would like to get back to PLOF, walking further, be better able to participate in dog showing and trips    Currently in Pain?  Yes    Pain Score  7     Pain Location  Back   and neck   Pain Descriptors / Indicators  Aching;Sharp    Pain Type  Chronic pain    Pain Radiating Towards  leg pain is better    Pain Onset  More than a month ago    Pain Frequency  Constant         TREATMENT:   Therapeutic exercise:to centralize symptoms and improve ROM, strength, muscular endurance, and activity tolerance required for successful completion of functional activities. - blood pressure measurementmanually measured at the wrist toensure safety of exercise. Pt requested it be taken manually because she feels this get a more accurate BP.elevated but safe for exercise. (see above for values). -NuStep level2using bilateral upper and lower extremities. Seatsetting 8/handle setting7. For improved extremity mobility, muscular endurance, and activity tolerance; and to induce the analgesic effect of aerobic exercise, stimulate improved joint nutrition, and prepare body structures and systems for following interventions. x3mnuteswith SPM above 60 during subjective exam.  SpO2 following was 96%, HR 80 bpm. Taken because patient became more fatigued than usual.  - sidelying hip abduction2x15 each side. Required physical assistance and cuing to get in true sidelying. To improve single leg stance pelvis stability for ambulation. - supine bridgeswith red theraband around distal thighs,2x15with cuing for full ROM. To strengthen hip extension for functional exercise. Cuing for proper form. -Prone hip extension with knee flexed to preferentially bias gluteus maximus.2x15each side with 2# dumbbell behind kneeCuing to keep  knee flexed.To improve functional strength.Required physical assistance and cuing to get close to proper form.  - prone straight leg hip extension to abduction and back. 2x10 each way. To improve hip strength for ambulation. Cuing for proper motion. -Education on HRuskAccess Code: [email or web address] URL: [email or web address]  Date: 12/03/2018  Prepared by: SRosita Kea  Exercises   Sit to Stand without Arm Support - 30 reps - 2x daily   Standing Hip Hiking - 3 sets - 10 reps - 2x daily   Supine Bridge with Resistance Band - 3 sets - 10 reps - 1 second hold - 2x daily   Sidelying Hip Abduction - 3 sets - 10 reps - 2x daily   Prone Hip Extension with Bent Knee - 3 sets - 10 reps - 2x daily   Prone Hip Abduction on Slider - 3 sets - 10 reps - 2x daily   PT Education - 12/29/18 1834    Education provided  Yes    Education Details  Exercise purpose/form. Self management techniques. Education on diagnosis, prognosis, POC, anatomy and physiology of current condition    Person(s) Educated  Patient    Methods  Explanation;Demonstration;Verbal cues;Tactile cues    Comprehension  Verbalized understanding;Returned demonstration;Verbal cues required;Tactile cues required       PT Short Term Goals - 12/16/18 1710      PT SHORT TERM GOAL #1   Title  Be independent with initial home exercise program for self-management of symptoms.    Baseline  Initial HEP provided at IE (11/17/2018); was completing until vascular procedure, re-instated 12/15/2018 (12/15/2018);    Time  2    Period  Weeks    Status  Partially Met    Target Date  12/30/18        PT Long Term Goals - 12/17/18 1422      PT LONG TERM GOAL #1   Title  Be independent with a long-term home exercise program for self-management of symptoms.     Baseline  Initial HEP provided at [facility] (11/17/2018); was doing better before vascular surgery, is working at returning to [facility] since procedure  (12/15/2018).    Time  12    Period  Weeks    Status  Partially Met    Target Date  02/09/19      PT LONG TERM GOAL #2   Title  Patient will improve 6MWT to equal or greater than 1000 feet with LRAD to demonstrate improved activity tolerance for community ambulation.     Baseline  6 Minute Walk Test: 200 feet with SPC and supervision. Stopped at 1:43 due to fatigue and leg pain. (11/17/2018); 457 feet SPC, on seated and one standing break (12/15/2018);    Time  12    Period  Weeks    Status  Partially Met    Target Date  02/09/19      PT LONG TERM GOAL #3   Title  Patient will demonstrate improved 5 Times Sit to Stand test to 11 seconds from chair height with no UE support to demonstrate improvement in LE power and functional strength for daily activities.    Baseline  5 Time Sit to Stand: 27 seconds from chair height no UE support (11/17/2018); 18 seconds, no UE support from chair-high plinth (12/15/2018);    Time  12    Period  Weeks    Status  Partially Met    Target Date  02/09/19      PT LONG TERM GOAL #4   Title  Patient will ambulate faster than 1.82ms on the 10 Meter Walk Test to improve ability to cross street safey for community participation.     Baseline  testing deferred to next session (11/17/2018); 1.37 m/sec with SPC (11/24/2018); 1.3 m/sec with SPC (12/15/2018);    Time  12  Period  Weeks    Status  Achieved    Target Date  02/09/19      PT LONG TERM GOAL #5   Title  Patient will score equal or greater than  25/30 on the Functional Gait Assessment to demonstrate low fall risk to improve her ability to participate safely in community activities.     Baseline  testing deferred to next session (11/17/2018); 11/30 high fall risk (11/24/2018); unable to measure due to time limitation (12/15/2018); 13/30 high fall risk (12/17/2018);    Time  12    Period  Weeks    Status  Partially Met    Target Date  02/09/19            Plan - 12/29/18 1843    Clinical Impression  Statement  Patient tolerated treatment well but continued to complain of back pain throughout. Feels that she requires the injections for her back pain to feel better. States her lumbar pain moved closer to the thoracic spine after laying prone on mat table but able to stand up straighter following. Continued to educate about the importance of smoking cessation and pt was reminded how much better she felt prior to her procedure when she stopped smoking for 2 weeks. Patient continues to struggle with pain and weakness but has returned to consistent performance of her sit <> stands which will help her build endurance and strength. Continued mat exercises for hips due to difficulty maintaining hip stability in single leg stance. Patient seemed more fatigued today and her blood pressure was lower than at previous visits but her SpO2 and HR was [facility] when measured. She did not experience light headedness. Patient required min A during bed mobility. Pt required multimodal cuing for proper technique and to facilitate improved neuromuscular control, strength, range of motion, and functional ability resulting in improved performance and form. Patient would benefit from continued physical therapy to address remaining impairments and functional limitations to work towards stated goals and return to PLOF or maximal functional independence.    Personal Factors and Comorbidities  Age;Comorbidity 3+;Fitness;Time since onset of injury/illness/exacerbation;Past/Current Experience;Other   significant anxiety affecting wellbeing and wellness behaviors   Comorbidities  underwent extensive vascular surgeries in Jan 2020 to improve blood flow, similar surgery for R leg scheduled 12/07/2018. Has a history of left carotid artery stenosis, but not enough to have surgery, has a history of multiple arterial surgeries, history of R cataract surgery, colon surgery, cholecystectomy, R foot skin graft, ovary surgery, gastric bypass, R foot fusion,  abdominal hysterectomy; current smoker, plantar fasciitis, peripheral arterial disease, LVH, diabetes mellitus with neuropathy, irregular heartbeat, hiatal hernia, GERD, stenosis of left heart ventricle, depression, lumbar degenerative disc disease, cardiac murmur, asthma, anxiety, obesity, HTN, and seasonal allergies She has been advised not to lift over 10 pounds by physician.      Examination-Activity Limitations  Bathing;Carry;Lift;Sit;Stand;Locomotion Level;Toileting;Dressing;Squat;Transfers;Stairs;Hygiene/Grooming;Other   ADLs, IADLs, and valued community and social activities including dog showing, transferring, ambulating household and community distances, squatting, stooping, dog handling/training, and stairs   Examination-Participation Restrictions  Community Activity;Volunteer;Cleaning;Interpersonal Relationship;Yard Social research officer, government, participation in Chartered certified accountant   Stability/Clinical Decision Making  Evolving/Moderate complexity    Rehab Potential  Fair    Clinical Impairments Affecting Rehab Potential  (+) motivated (-) muliple co morbidities, chronic condition    PT Frequency  2x / week    PT Duration  12 weeks    PT Treatment/Interventions  Moist Heat;Patient/family education;Neuromuscular re-education;Therapeutic exercise;Manual techniques;ADLs/Self Care Home Management;Electrical  Stimulation;Gait training;Stair training;Functional mobility training;Therapeutic activities;Balance training;Passive range of motion;Dry needling;Energy conservation;Spinal Manipulations;Joint Manipulations;Cryotherapy   joint mobilizations grades I-IV   PT Next Visit Plan  continue with progressive LE and  functional strengtheing    PT Home Exercise Plan  Medbridge Access Code: [email or web address]    Consulted and Agree with Plan of Care  Patient       Patient will benefit from skilled therapeutic intervention in order to improve the following deficits and impairments:  Decreased strength, Impaired  flexibility, Decreased activity tolerance, Impaired perceived functional ability, Pain, Decreased endurance, Difficulty walking, Abnormal gait, Decreased knowledge of use of DME, Decreased skin integrity, Decreased range of motion, Impaired sensation, Improper body mechanics, Obesity, Postural dysfunction, Increased edema, Decreased mobility, Decreased balance, Cardiopulmonary status limiting activity, Decreased coordination  Visit Diagnosis: 1. Muscle weakness (generalized)   2. Chronic bilateral low back pain, unspecified whether sciatica present   3. Difficulty in walking, not elsewhere classified   4. History of falling        Problem List Patient Active Problem List   Diagnosis Date Noted  . Lower extremity edema 07/31/2018  . Pressure injury of skin 07/24/2018  . Surgical site infection 07/23/2018  . Wound of left leg 07/23/2018  . Atherosclerosis of artery of extremity with rest pain ([place]) 07/08/2018  . Shortness of breath 06/02/2018  . Bruit 06/02/2018  . Coronary artery disease of native artery of native heart with stable angina pectoris ([place]) 06/02/2018  . Atherosclerosis of native arteries of extremity with intermittent claudication ([place]) 05/19/2018  . Peripheral arterial disease ([place]) 05/06/2018  . Personal history of colonic polyps   . Aortic stenosis 12/24/2017  . LVH (left ventricular hypertrophy) 12/24/2017  . Hypertension with heart disease 12/24/2017  . Left atrial dilatation 12/12/2017  . Cardiac murmur 12/12/2017  . Gastrojejunal ulcer 12/04/2017  . Hyperphosphatemia 12/04/2017  . Spinal stenosis of lumbar region 06/18/2017  . Degeneration of lumbar intervertebral disc 06/02/2017  . Assistance needed with transportation 05/26/2017  . Financial difficulties 05/26/2017  . Needs assistance with community resources 05/26/2017  . Moderate recurrent major depression ([place]) 05/26/2017  . Non-healing wound of lower extremity 02/06/2017  . Status post ankle  arthrodesis 12/11/2016  . Controlled substance agreement broken 10/11/2016  . Low back pain 09/25/2016  . Osteoarthritis of right subtalar joint 07/11/2016  . Posterior tibial tendinitis of right lower extremity 07/11/2016  . Breast cancer screening 06/18/2016  . Knee pain 04/03/2016  . Hyponatremia 03/01/2016  . High triglycerides 12/24/2015  . Pes planus of both feet 11/16/2015  . Plantar fasciitis of right foot 11/16/2015  . Heel spur 11/16/2015  . Diabetic neuropathy ([place]) 11/16/2015  . Right ankle pain 11/16/2015  . Medication monitoring encounter 11/16/2015  . Chronic pain of multiple joints 08/15/2015  . Abnormal mammogram of right breast 06/19/2015  . Cerumen impaction 03/16/2015  . Hyperlipidemia 12/14/2014  . History of transfusion of packed RBC 12/14/2014  . Hx of smoking 12/14/2014  . Status post bariatric surgery 12/14/2014  . Morbid obesity ([place]) 12/14/2014  . Chronic radicular lumbar pain 12/14/2014  . History of GI bleed 11/12/2014  . GERD without esophagitis 11/12/2014  . History of small bowel obstruction 09/07/2014    Everlean Alstrom. [name], PT, DPT 12/29/18, 6:45 PM  [facility] [address], [place], [address] Phone: [phone]   Fax:  [phone]  Name: Andrea Holden MRN: [id number] Date of Birth: September 29, 1945

## 2018-12-31 ENCOUNTER — Telehealth: Payer: Self-pay

## 2018-12-31 ENCOUNTER — Telehealth (INDEPENDENT_AMBULATORY_CARE_PROVIDER_SITE_OTHER): Payer: Self-pay

## 2018-12-31 ENCOUNTER — Other Ambulatory Visit (INDEPENDENT_AMBULATORY_CARE_PROVIDER_SITE_OTHER): Payer: Self-pay | Admitting: Vascular Surgery

## 2018-12-31 ENCOUNTER — Ambulatory Visit: Payer: Medicare Other | Attending: Nurse Practitioner | Admitting: Physical Therapy

## 2018-12-31 DIAGNOSIS — M5441 Lumbago with sciatica, right side: Secondary | ICD-10-CM | POA: Insufficient documentation

## 2018-12-31 DIAGNOSIS — M5440 Lumbago with sciatica, unspecified side: Secondary | ICD-10-CM | POA: Insufficient documentation

## 2018-12-31 DIAGNOSIS — I739 Peripheral vascular disease, unspecified: Secondary | ICD-10-CM

## 2018-12-31 DIAGNOSIS — Z9181 History of falling: Secondary | ICD-10-CM | POA: Insufficient documentation

## 2018-12-31 DIAGNOSIS — Z95820 Peripheral vascular angioplasty status with implants and grafts: Secondary | ICD-10-CM

## 2018-12-31 DIAGNOSIS — R262 Difficulty in walking, not elsewhere classified: Secondary | ICD-10-CM | POA: Insufficient documentation

## 2018-12-31 DIAGNOSIS — M62838 Other muscle spasm: Secondary | ICD-10-CM | POA: Insufficient documentation

## 2018-12-31 DIAGNOSIS — G8929 Other chronic pain: Secondary | ICD-10-CM | POA: Insufficient documentation

## 2018-12-31 DIAGNOSIS — M6281 Muscle weakness (generalized): Secondary | ICD-10-CM | POA: Insufficient documentation

## 2018-12-31 DIAGNOSIS — M545 Low back pain: Secondary | ICD-10-CM | POA: Insufficient documentation

## 2018-12-31 NOTE — Telephone Encounter (Signed)
Patient left a message checking on the status of clearance for to be off  blood thinner for her her back procedure due to she needed to be off 7 days prior.I spoke with patient and informed her we have receive the fax but she needed to be seen first since Dr Lucky Cowboy did a procedure on 12/07/2018 to make sure it was safe to stop the blood thinner as requested per Eulogio Ditch NP.The patient had appointment 01/07/2019 but the patient wanted to seen sooner so the appointment has moved up to 01/04/2019

## 2018-12-31 NOTE — Telephone Encounter (Signed)
Paperwork from [facility] was fax [facility]VS due to Dr. Lucky Cowboy being the prescribing provider for the patients plavix, patient notified.      Copied from [place] [id number]. Topic: General - Inquiry >> Dec 31, 2018  8:52 AM Andrea Holden wrote: Reason for CRM: pt wants to know if you received the fax from [facility] requesting an order to stop her blood thinners prior to having her back injections. Pt states her back pain is very bad, and she really needs this injection.  Pt hopes you can get this back to them asap, because she has to stop blood thinners 7 days and then she can get her shot.  Please advise if you have not received.

## 2019-01-04 ENCOUNTER — Other Ambulatory Visit: Payer: Self-pay | Admitting: Nurse Practitioner

## 2019-01-04 ENCOUNTER — Ambulatory Visit (INDEPENDENT_AMBULATORY_CARE_PROVIDER_SITE_OTHER): Payer: Medicare (Managed Care) | Admitting: Nurse Practitioner

## 2019-01-04 ENCOUNTER — Other Ambulatory Visit: Payer: Self-pay

## 2019-01-04 ENCOUNTER — Ambulatory Visit (INDEPENDENT_AMBULATORY_CARE_PROVIDER_SITE_OTHER): Payer: Medicare (Managed Care)

## 2019-01-04 ENCOUNTER — Encounter (INDEPENDENT_AMBULATORY_CARE_PROVIDER_SITE_OTHER): Payer: Self-pay | Admitting: Nurse Practitioner

## 2019-01-04 VITALS — BP 162/54 | HR 77 | Resp 16 | Wt 207.0 lb

## 2019-01-04 DIAGNOSIS — M19071 Primary osteoarthritis, right ankle and foot: Secondary | ICD-10-CM

## 2019-01-04 DIAGNOSIS — I70223 Atherosclerosis of native arteries of extremities with rest pain, bilateral legs: Secondary | ICD-10-CM | POA: Diagnosis not present

## 2019-01-04 DIAGNOSIS — Z95820 Peripheral vascular angioplasty status with implants and grafts: Secondary | ICD-10-CM

## 2019-01-04 DIAGNOSIS — Z79899 Other long term (current) drug therapy: Secondary | ICD-10-CM

## 2019-01-04 DIAGNOSIS — Z791 Long term (current) use of non-steroidal anti-inflammatories (NSAID): Secondary | ICD-10-CM

## 2019-01-04 DIAGNOSIS — I119 Hypertensive heart disease without heart failure: Secondary | ICD-10-CM | POA: Diagnosis not present

## 2019-01-04 DIAGNOSIS — Z9889 Other specified postprocedural states: Secondary | ICD-10-CM

## 2019-01-04 DIAGNOSIS — I739 Peripheral vascular disease, unspecified: Secondary | ICD-10-CM | POA: Diagnosis not present

## 2019-01-04 DIAGNOSIS — Z87891 Personal history of nicotine dependence: Secondary | ICD-10-CM

## 2019-01-04 DIAGNOSIS — I70229 Atherosclerosis of native arteries of extremities with rest pain, unspecified extremity: Secondary | ICD-10-CM

## 2019-01-04 MED ORDER — CHANTIX 1 MG PO TABS: 1.0000 mg | ORAL_TABLET | Freq: Two times a day (BID) | ORAL | 2 refills | Status: DC

## 2019-01-05 ENCOUNTER — Ambulatory Visit (INDEPENDENT_AMBULATORY_CARE_PROVIDER_SITE_OTHER): Payer: Medicare Other

## 2019-01-05 ENCOUNTER — Ambulatory Visit: Payer: Medicare Other

## 2019-01-05 VITALS — Ht 61.0 in | Wt 207.0 lb

## 2019-01-05 DIAGNOSIS — M5441 Lumbago with sciatica, right side: Secondary | ICD-10-CM | POA: Diagnosis not present

## 2019-01-05 DIAGNOSIS — Z0000 Encounter for general adult medical examination without abnormal findings: Secondary | ICD-10-CM | POA: Diagnosis not present

## 2019-01-05 DIAGNOSIS — M6281 Muscle weakness (generalized): Secondary | ICD-10-CM | POA: Diagnosis not present

## 2019-01-05 DIAGNOSIS — Z9181 History of falling: Secondary | ICD-10-CM

## 2019-01-05 DIAGNOSIS — G8929 Other chronic pain: Secondary | ICD-10-CM

## 2019-01-05 DIAGNOSIS — M545 Low back pain, unspecified: Secondary | ICD-10-CM

## 2019-01-05 DIAGNOSIS — R262 Difficulty in walking, not elsewhere classified: Secondary | ICD-10-CM | POA: Diagnosis not present

## 2019-01-05 DIAGNOSIS — M5440 Lumbago with sciatica, unspecified side: Secondary | ICD-10-CM | POA: Diagnosis not present

## 2019-01-05 DIAGNOSIS — M62838 Other muscle spasm: Secondary | ICD-10-CM | POA: Diagnosis not present

## 2019-01-05 NOTE — Progress Notes (Signed)
Subjective:   Andrea Holden is a 73 y.o. female who presents for Medicare Annual (Subsequent) preventive examination.  Virtual Visit via Telephone Note  I connected with Tylin J Elsbernd on 01/05/19 at 11:40 AM EDT by telephone and verified that I am speaking with the correct person using two identifiers.  Medicare Annual Wellness visit completed telephonically due to Covid-19 pandemic.   Location: Patient: home Provider: office   I discussed the limitations, risks, security and privacy concerns of performing an evaluation and management service by telephone and the availability of in person appointments. The patient expressed understanding and agreed to proceed.   Some vital signs may be absent or patient reported. Pt does not have at home blood pressure monitor.   [name], LPN  Review of Systems:   Cardiac Risk Factors include: advanced age (>27men, >19 women);diabetes mellitus;dyslipidemia;hypertension;obesity (BMI >30kg/m2)     Objective:     Vitals: Ht 5\' 1"  (1.549 m)   Wt 207 lb (93.9 kg)   BMI 39.11 kg/m   Body mass index is 39.11 kg/m.  Advanced Directives 01/05/2019 11/17/2018 07/23/2018 07/08/2018 07/08/2018 06/29/2018 05/27/2018  Does Patient Have a Medical Advance Directive? Yes No Yes Yes Yes Yes No  Type of Product manager Power of [employer] Power of [place];Living will Living will;Healthcare Power of Attorney -  Does patient want to make changes to medical advance directive? - - No - Patient declined No - Patient declined No - Patient declined No - Patient declined -  Copy of [place] in Chart? No - copy requested - No - copy requested No - copy requested No - copy requested No - copy requested -  Would patient like information on creating a medical advance directive? - Yes (MAU/Ambulatory/Procedural Areas - Information given) - - - - No - Patient  declined    Tobacco Social History   Tobacco Use  Smoking Status Current Some Day Smoker  . Packs/day: 0.50  . Years: 55.00  . Pack years: 27.50  . Types: Cigarettes  . Start date: 07/01/1960  . Last attempt to quit: 11/22/2018  . Years since quitting: 0.1  Smokeless Tobacco Never Used  Tobacco Comment   trying to quit again, using chantix     Ready to quit: Not Answered Counseling given: Not Answered Comment: trying to quit again, using chantix   Clinical Intake:  Pre-visit preparation completed: Yes  Pain : 0-10 Pain Score: 9  Pain Type: Chronic pain Pain Location: Back Pain Orientation: Lower Pain Descriptors / Indicators: Aching, Discomfort, Sore Pain Onset: More than a month ago Pain Frequency: Constant     BMI - recorded: 39.11 Nutritional Status: BMI > 30  Obese Nutritional Risks: Nausea/ vomitting/ diarrhea(dumping syndrome from gastric bypass occasionally) Diabetes: Yes CBG done?: No Did pt. bring in CBG monitor from home?: No   Nutrition Risk Assessment:  Has the patient had any N/V/D within the last 2 months?  No  Does the patient have any non-healing wounds?  No  Has the patient had any unintentional weight loss or weight gain?  No   Diabetes:  Is the patient diabetic?  Yes  If diabetic, was a CBG obtained today?  No  Did the patient bring in their glucometer from home?  No  How often do you monitor your CBG's? Pt does check blood sugars  Financial Strains and Diabetes Management:  Are you having any financial strains with  the device, your supplies or your medication? No .  Does the patient want to be seen by Chronic Care Management for management of their diabetes?  Yes  Would the patient like to be referred to a Nutritionist or for Diabetic Management?  No   Diabetic Exams:  Diabetic Eye Exam: Completed 08/21/18 negative retinopathy.   Diabetic Foot Exam: Completed 12/12/17. Pt ha/s been advised about the importance in completing this  exam. Pt is scheduled for diabetic foot exam on 03/11/19.   How often do you need to have someone help you when you read instructions, pamphlets, or other written materials from your doctor or pharmacy?: 1 - Never  Interpreter Needed?: No  Information entered by :: Reather LittlerKasey Dontrez Pettis LPN  Past Medical History:  Diagnosis Date  . Allergy    seasonal allergies  . Anemia 2014   needed 5 units of blood d/t passing out, weak  . Anxiety   . Aortic stenosis 12/24/2017   Echo Aug 2018  . Arthritis   . Asthma    allergy induced asthma  . Cardiac murmur 12/12/2017  . Cataract   . Cataract    left  . Complication of anesthesia    arrhythmia following colonoscopy  . Degenerative disc disease, lumbar   . Degenerative disc disease, lumbar   . Depression   . Diabetes mellitus   . Diabetic neuropathy (HCC) 11/16/2015  . Dysrhythmia    stenosis of left ventricle  . GERD (gastroesophageal reflux disease)   . H/O transfusion    [facility], blood type O+  . History of chicken pox   . History of hiatal hernia   . History of measles, mumps, or rubella   . HOH (hard of hearing)    does not use hearing aides yet  . Hyperlipidemia   . Hypertension   . Irregular heartbeat   . LVH (left ventricular hypertrophy) 12/24/2017   Echo Aug 2018  . Neuropathy   . Opiate use 11/16/2015  . Peripheral arterial disease (HCC) 05/06/2018   At rest, left > right; refer to vasc  . Pes planus of both feet 11/16/2015  . Plantar fasciitis of right foot 11/16/2015  . Wheezing    Past Surgical History:  Procedure Laterality Date  . ABDOMINAL HYSTERECTOMY    . APPLICATION OF WOUND VAC Left 07/23/2018   Procedure: APPLICATION OF WOUND VAC;  Surgeon: Annice Needyew, Jason S, MD;  Location: [facility] ORS;  Service: Vascular;  Laterality: Left;  . banck injections    . CATARACT EXTRACTION W/PHACO Right 05/30/2015   Procedure: CATARACT EXTRACTION PHACO AND INTRAOCULAR LENS PLACEMENT (IOC);  Surgeon:  Galen ManilaWilliam Porfilio, MD;  Location: [facility] ORS;  Service: Ophthalmology;  Laterality: Right;  US 00:57 AP% 20.9 CDE 11.99 fluid pack lot #[id number]  . CHOLECYSTECTOMY  1970  . COLON SURGERY  2013   blocked colon  . COLONOSCOPY WITH PROPOFOL N/A 01/20/2018   Procedure: COLONOSCOPY WITH PROPOFOL;  Surgeon: Midge MiniumWohl, Darren, MD;  [facility];  Service: Endoscopy;  Laterality: N/A;  . ENDARTERECTOMY FEMORAL Left 07/08/2018   Procedure: ENDARTERECTOMY FEMORAL;  Surgeon: Annice Needyew, Jason S, MD;  Location: [facility] ORS;  Service: Vascular;  Laterality: Left;  . EYE SURGERY Right    cataract surgery  . FOOT FUSION Right 2018   metal in foot  . GALLBLADDER SURGERY  1970  . GASTRIC BYPASS  2010   lost 178 lbs and regained 40 lbs last few years  .  HUMERUS FRACTURE SURGERY Right    metal plate with screws  . internal bleeding  2016   ulcer in past  . LOWER EXTREMITY ANGIOGRAM Left 07/08/2018   Procedure: LOWER EXTREMITY ANGIOGRAM;  Surgeon: Annice Needy, MD;  Location: [facility] ORS;  Service: Vascular;  Laterality: Left;  . LOWER EXTREMITY ANGIOGRAPHY Left 05/27/2018   Procedure: LOWER EXTREMITY ANGIOGRAPHY;  Surgeon: Annice Needy, MD;  Location: [facility] INVASIVE CV LAB;  Service: Cardiovascular;  Laterality: Left;  . LOWER EXTREMITY ANGIOGRAPHY Right 12/07/2018   Procedure: LOWER EXTREMITY ANGIOGRAPHY;  Surgeon: Annice Needy, MD;  Location: [facility] INVASIVE CV LAB;  Service: Cardiovascular;  Laterality: Right;  . MOUTH SURGERY     root canals and crowns and extractions  . OVARY SURGERY    . SKIN GRAFT Right 2018   RT foot. foot has been rebuilt.  it is full of metal  . TENOTOMY ACHILLES TENDON Right    Percuntaneous. metal in foot  . TONSILLECTOMY    . WOUND DEBRIDEMENT Left 07/23/2018   Procedure: DEBRIDEMENT WOUND;  Surgeon: Annice Needy, MD;  Location: [facility] ORS;  Service: Vascular;  Laterality: Left;   Family History  Problem Relation Age of Onset  . Asthma Mother   . COPD Mother   . Arthritis Father   .  Depression Father   . Heart disease Father   . Hypertension Father   . Stroke Father   . Heart attack Father   . Arthritis Brother   . Depression Brother   . Diabetes Brother   . Heart disease Brother   . Hyperlipidemia Brother   . Hypertension Brother   . Stroke Brother   . Vision loss Brother   . Heart attack Brother   . Diabetes Maternal Grandmother   . Thyroid disease Daughter   . Colitis Daughter   . Breast cancer Neg Hx    Social History   Socioeconomic History  . Marital status: Divorced    Spouse name: Not on file  . Number of children: 1  . Years of education: Not on file  . Highest education level: Associate degree: academic program  Occupational History  . Occupation: Lawyer    Comment: [employer]  Social Needs  . Financial resource strain: Very hard  . Food insecurity    Worry: Never true    Inability: Never true  . Transportation needs    Medical: No    Non-medical: No  Tobacco Use  . Smoking status: Current Some Day Smoker    Packs/day: 0.50    Years: 55.00    Pack years: 27.50    Types: Cigarettes    Start date: 07/01/1960    Last attempt to quit: 11/22/2018    Years since quitting: 0.1  . Smokeless tobacco: Never Used  . Tobacco comment: trying to quit again, using chantix  Substance and Sexual Activity  . Alcohol use: Yes    Alcohol/week: 0.0 standard drinks    Comment: 2 drinks per month  . Drug use: Not Currently    Types: Marijuana    Comment: used for pain  . Sexual activity: Not Currently  Lifestyle  . Physical activity    Days per week: 7 days    Minutes per session: 10 min  . Stress: To some extent  Relationships  . Social Musician on phone: Three times a week    Gets together: Once a week    Attends religious  service: 1 to 4 times per year    Active member of club or organization: Yes    Attends meetings of clubs or organizations: More than 4 times per year    Relationship  status: Divorced  Other Topics Concern  . Not on file  Social History Narrative  . Not on file    Outpatient Encounter Medications as of 01/05/2019  Medication Sig  . acetaminophen (TYLENOL) 325 MG tablet Take 325 mg by mouth every 6 (six) hours as needed.   Marland Kitchen albuterol (PROVENTIL HFA;VENTOLIN HFA) 108 (90 Base) MCG/ACT inhaler Inhale 2 puffs into the lungs every 6 (six) hours as needed for wheezing or shortness of breath.  Marland Kitchen atorvastatin (LIPITOR) 10 MG tablet Take 1 tablet (10 mg total) by mouth at bedtime.  . CHANTIX 1 MG tablet Take 1 tablet (1 mg total) by mouth 2 (two) times daily.  . clopidogrel (PLAVIX) 75 MG tablet TAKE 1 TABLET BY MOUTH ONCE DAILY  . co-enzyme Q-10 30 MG capsule Take 100 mg by mouth daily.   . fluticasone (FLONASE) 50 MCG/ACT nasal spray Place 2 sprays into both nostrils daily as needed.  . gabapentin (NEURONTIN) 400 MG capsule Take 1 capsule (400 mg total) by mouth 2 (two) times daily.  . Glucosamine-Chondroit-Vit C-Mn (GLUCOSAMINE 1500 COMPLEX) CAPS Take 1 capsule by mouth daily.  Marland Kitchen HYDROcodone-acetaminophen (NORCO) 5-325 MG tablet Take 0.5 tablets by mouth every 6 (six) hours as needed for moderate pain.  Marland Kitchen lisinopril (ZESTRIL) 10 MG tablet Take 1 tablet (10 mg total) by mouth daily.  . Melatonin 5 MG TABS Take 1 tablet by mouth at bedtime.   . Multiple Vitamins-Minerals (MULTIVITAMIN ADULTS 50+) TABS Take 1 tablet by mouth daily.  . pantoprazole (PROTONIX) 20 MG tablet TAKE 1 TABLET BY MOUTH ONCE DAILY  . sertraline (ZOLOFT) 100 MG tablet Take 1.5 tablets (150 mg total) by mouth daily.  . vitamin C (ASCORBIC ACID) 500 MG tablet Take 500 mg by mouth daily.   . [DISCONTINUED] calcium carbonate (TUMS - DOSED IN MG ELEMENTAL CALCIUM) 500 MG chewable tablet Chew 3 tablets by mouth daily. Takes as she remembers  . [DISCONTINUED] silver sulfADIAZINE (SILVADENE) 1 % cream Apply 1 application topically daily.  . [DISCONTINUED] varenicline (CHANTIX) 1 MG tablet Take 1  tablet (1 mg total) by mouth 2 (two) times daily.   No facility-administered encounter medications on file as of 01/05/2019.     Activities of Daily Living In your present state of health, do you have any difficulty performing the following activities: 01/05/2019 12/18/2018  Hearing? N Y  Comment declines hearing aids -  Vision? N N  Difficulty concentrating or making decisions? N N  Walking or climbing stairs? Y Y  Dressing or bathing? N N  Doing errands, shopping? N N  Preparing Food and eating ? N -  Comment - -  Using the Toilet? N -  In the past six months, have you accidently leaked urine? Y -  Comment wears pads for protection at night -  Do you have problems with loss of bowel control? N -  Managing your Medications? N -  Managing your Finances? N -  Housekeeping or managing your Housekeeping? N -  Comment - -  Some recent data might be hidden    Patient Care Team: Cheryle Horsfall, NP as PCP - General (Nurse Practitioner) Dimmig, Maisie Fus, MD as Consulting Physician (Orthopedic Surgery) Gordan Payment, DO as Consulting Physician (Physical Medicine and Rehabilitation) Graylin Shiver, MD  as Consulting Physician (Plastic Surgery) Andee Poles, [facility] (Pharmacist) Oscar La, RN as Case Manager Gollan, Tollie Pizza, MD as Consulting Physician (Cardiology) Wenda Overland, [place] as [facility] Care Management (Licensed Clinical Social Worker)    Assessment:   This is a routine wellness examination for Andrea Holden.  Exercise Activities and Dietary recommendations Current Exercise Habits: Home exercise routine, Type of exercise: calisthenics, Time (Minutes): 10, Frequency (Times/Week): 7, Weekly Exercise (Minutes/Week): 70, Intensity: Mild, Exercise limited by: orthopedic condition(s);cardiac condition(s)  Goals    . "i want to get back to running in the dog ring" (pt-stated)     Current Barriers:  . Current health status . Lack of stamina and strength    Nurse Case Manager Clinical Goal(s): Over the next 3 months, patient will verbalize engaging in silver sneakers to improve stamina and strength  Interventions:  . Assisted patient with Fitbit activity tracker set up on mobile device and assisted with activity goal setting   Patient Self Care Activities:  . Patient will explore options for exercise once wound removed . Patient will engage in exercise to improve stamina and strength . Patient will continue to work with PT . Patient will discuss activity goals with PT during today's session.   Please see past updates in goals section for documentation of goal progression      . DIET - INCREASE WATER INTAKE     Recommend to drink at least 6-8 8oz glasses of water per day.    Marland Kitchen i need help setting up my new Fitbit and my new BP monitor (pt-stated)     Current Barriers:  . Lacks caregiver support.  . Knowledge Deficits related to understanding technology of Fitbit and how to set up  Nurse Case Manager Clinical Goal(s):  Marland Kitchen Over the next 14 days, patient will demonstrate improved health management independence as evidenced byutilizing her new BP cuff and Fitbit for monitoring activity, HR, and BP  Interventions:  . Provided education to patient re: utilization of Fitbit and importance of setting activity goals to decrease sedentary lifestyle . Advised patient, providing education and rationale, to monitor blood pressure daily and record, calling Dr. Sherie Don for findings outside established parameters.   Patient Self Care Activities:  . Increase daily activity by monitoring "steps" . Check and record BP daily  Plan:  . RNCM will provide technology support as needed . Patient will contact the CCM Team with any questions regarding technology reviewed today  *initial goal documentation        Fall Risk Fall Risk  01/05/2019 12/18/2018 11/02/2018 08/04/2018 06/05/2018  Falls in the past year? Comment - - - - -  Number falls in  past yr: 0 1  Injury with Fall? 0 0 1 0 0  Comment - - - - -  Risk for fall due to : - - History of fall(s) - -  Risk for fall due to: Comment - - - - -  Follow up Falls prevention discussed - - - -   FALL RISK PREVENTION PERTAINING TO THE HOME:  Any stairs in or around the home? Yes  If so, do they handrails? Yes   Home free of loose throw rugs in walkways, pet beds, electrical cords, etc? Yes  Adequate lighting in your home to reduce risk of falls? Yes   ASSISTIVE DEVICES UTILIZED TO PREVENT FALLS:  Life alert? Yes  Use of a cane, walker or w/c?  Yes  Grab bars in the bathroom? Yes  Shower chair or bench in shower? Yes  Elevated toilet seat or a handicapped toilet? Yes   DME ORDERS:  DME order needed?  No   TIMED UP AND GO:  Was the test performed? No . Telephonic visit.   Education: Fall risk prevention has been discussed.  Intervention(s) required? No   Depression Screen PHQ 2/9 Scores 01/05/2019 12/18/2018 11/19/2018 11/02/2018  PHQ - 2 Score 1 0 0 1  PHQ- 9 Score 4 0 - 2     Cognitive Function     6CIT Screen 01/05/2019 10/17/2017  What Year? 0 points 0 points  What month? 0 points 0 points  What time? 0 points 0 points  Count back from 20 0 points 0 points  Months in reverse 0 points 0 points  Repeat phrase 0 points 0 points  Total Score 0 0    Immunization History  Administered Date(s) Administered  . Influenza, High Dose Seasonal PF 03/16/2015, 02/28/2016, 03/16/2018  . Influenza,inj,Quad PF,6+ Mos 04/09/2017  . Influenza,inj,quad, With Preservative 08/29/2016  . Influenza-Unspecified 04/09/2017, 03/16/2018  . Pneumococcal Conjugate-13 03/16/2015, 11/20/2017, 12/03/2017  . Pneumococcal-Unspecified 08/29/2016  . Tdap 08/15/2015  . Zoster Recombinat (Shingrix) 09/08/2017, 11/20/2017, 12/03/2017    Qualifies for Shingles Vaccine? Yes  Shingrix series completed.  Tdap: Up to date  Flu Vaccine: Up to date  Pneumococcal Vaccine: Up to date    Screening Tests Health Maintenance  Topic Date Due  . DEXA SCAN  1945/08/21  . MAMMOGRAM  07/18/2018  . FOOT EXAM  12/13/2018  . INFLUENZA VACCINE  01/30/2019  . HEMOGLOBIN A1C  06/19/2019  . OPHTHALMOLOGY EXAM  08/22/2019  . COLONOSCOPY  01/21/2023  . TETANUS/TDAP  08/14/2025  . Hepatitis C Screening  Completed  . PNA vac Low Risk Adult  Addressed    Cancer Screenings:  Colorectal Screening: Completed 01/20/18. Repeat every 5 years.  Mammogram: Completed 07/18/17. Scheduled for 03/03/19.  Bone Density: Not completed. Scheduled for 03/03/19.   Lung Cancer Screening: (Low Dose CT Chest recommended if Age 62-80 years, 30 pack-year currently smoking OR have quit w/in 15years.) does not qualify. Pt has smoked off and on for many years. She states her cardiologist may be doing a chest CT but unsure at this time.   Additional Screening:  Hepatitis C Screening: does qualify; Completed 10/17/17.  Vision Screening: Recommended annual ophthalmology exams for early detection of glaucoma and other disorders of the eye. Is the patient up to date with their annual eye exam?  Yes   Who is the provider or what is the name of the office in which the pt attends annual eye exams? [facility]  Dental Screening: Recommended annual dental exams for proper oral hygiene  Community Resource Referral:  CRR required this visit?  No      Plan:     I have personally reviewed and addressed the Medicare Annual Wellness questionnaire and have noted the following in the patient's chart:  A. Medical and social history B. Use of alcohol, tobacco or illicit drugs  C. Current medications and supplements D. Functional ability and status E.  Nutritional status F.  Physical activity G. Advance directives H. List of other physicians I.  Hospitalizations, surgeries, and ER visits in previous 12 months J.  Vitals K. Screenings such as hearing and vision if needed, cognitive and depression L. Referrals  and appointments   In addition, I have reviewed and discussed with patient certain preventive protocols,  quality metrics, and best practice recommendations. A written personalized care plan for preventive services as well as general preventive health recommendations were provided to patient.   Signed,  Reather LittlerKasey Deagan Sevin, LPN Nurse Health Advisor   Nurse Notes: pt dealing with several medical issues at once with physical therapy, back pain, vascular issues, etc. She is currently working with CCM team to help manage chronic conditions and social goals.

## 2019-01-05 NOTE — Patient Instructions (Signed)
Andrea Holden , Thank you for taking time to come for your Medicare Wellness Visit. I appreciate your ongoing commitment to your health goals. Please review the following plan we discussed and let me know if I can assist you in the future.   Screening recommendations/referrals: Colonoscopy: done 01/20/18. Repeat in 2024. Mammogram: done 07/18/17. Scheduled for 03/03/19. Bone Density: scheduled for 03/03/19. Recommended yearly ophthalmology/optometry visit for glaucoma screening and checkup Recommended yearly dental visit for hygiene and checkup  Vaccinations: Influenza vaccine: done 03/16/18 Pneumococcal vaccine: done 12/03/17 Tdap vaccine: done 08/15/15 Shingles vaccine: Shingrix series completed 12/03/17  Advanced directives: Please bring a copy of your health care power of attorney and living will to the office at your convenience.  Conditions/risks identified: Recommend healthy eating, physical activity and physical therapy to increase mobility.   Next appointment: Please follow up in one year for your Medicare Annual Wellness visit.     Preventive Care 44 Years and Older, Female Preventive care refers to lifestyle choices and visits with your health care provider that can promote health and wellness. What does preventive care include?  A yearly physical exam. This is also called an annual well check.  Dental exams once or twice a year.  Routine eye exams. Ask your health care provider how often you should have your eyes checked.  Personal lifestyle choices, including:  Daily care of your teeth and gums.  Regular physical activity.  Eating a healthy diet.  Avoiding tobacco and drug use.  Limiting alcohol use.  Practicing safe sex.  Taking low-dose aspirin every day.  Taking vitamin and mineral supplements as recommended by your health care provider. What happens during an annual well check? The services and screenings done by your health care provider during your annual  well check will depend on your age, overall health, lifestyle risk factors, and family history of disease. Counseling  Your health care provider may ask you questions about your:  Alcohol use.  Tobacco use.  Drug use.  Emotional well-being.  Home and relationship well-being.  Sexual activity.  Eating habits.  History of falls.  Memory and ability to understand (cognition).  Work and work Statistician.  Reproductive health. Screening  You may have the following tests or measurements:  Height, weight, and BMI.  Blood pressure.  Lipid and cholesterol levels. These may be checked every 5 years, or more frequently if you are over 97 years old.  Skin check.  Lung cancer screening. You may have this screening every year starting at age 12 if you have a 30-pack-year history of smoking and currently smoke or have quit within the past 15 years.  Fecal occult blood test (FOBT) of the stool. You may have this test every year starting at age 36.  Flexible sigmoidoscopy or colonoscopy. You may have a sigmoidoscopy every 5 years or a colonoscopy every 10 years starting at age 19.  Hepatitis C blood test.  Hepatitis B blood test.  Sexually transmitted disease (STD) testing.  Diabetes screening. This is done by checking your blood sugar (glucose) after you have not eaten for a while (fasting). You may have this done every 1-3 years.  Bone density scan. This is done to screen for osteoporosis. You may have this done starting at age 12.  Mammogram. This may be done every 1-2 years. Talk to your health care provider about how often you should have regular mammograms. Talk with your health care provider about your test results, treatment options, and if necessary, the need for  more tests. Vaccines  Your health care provider may recommend certain vaccines, such as:  Influenza vaccine. This is recommended every year.  Tetanus, diphtheria, and acellular pertussis (Tdap, Td) vaccine.  You may need a Td booster every 10 years.  Zoster vaccine. You may need this after age 39.  Pneumococcal 13-valent conjugate (PCV13) vaccine. One dose is recommended after age 35.  Pneumococcal polysaccharide (PPSV23) vaccine. One dose is recommended after age 74. Talk to your health care provider about which screenings and vaccines you need and how often you need them. This information is not intended to replace advice given to you by your health care provider. Make sure you discuss any questions you have with your health care provider. Document Released: 07/14/2015 Document Revised: 03/06/2016 Document Reviewed: 04/18/2015 Elsevier Interactive Patient Education  2017 [place] Prevention in the Home Falls can cause injuries. They can happen to people of all ages. There are many things you can do to make your home safe and to help prevent falls. What can I do on the outside of my home?  Regularly fix the edges of walkways and driveways and fix any cracks.  Remove anything that might make you trip as you walk through a door, such as a raised step or threshold.  Trim any bushes or trees on the path to your home.  Use bright outdoor lighting.  Clear any walking paths of anything that might make someone trip, such as rocks or tools.  Regularly check to see if handrails are loose or broken. Make sure that both sides of any steps have handrails.  Any raised decks and porches should have guardrails on the edges.  Have any leaves, snow, or ice cleared regularly.  Use sand or salt on walking paths during winter.  Clean up any spills in your garage right away. This includes oil or grease spills. What can I do in the bathroom?  Use night lights.  Install grab bars by the toilet and in the tub and shower. Do not use towel bars as grab bars.  Use non-skid mats or decals in the tub or shower.  If you need to sit down in the shower, use a plastic, non-slip stool.  Keep the  floor dry. Clean up any water that spills on the floor as soon as it happens.  Remove soap buildup in the tub or shower regularly.  Attach bath mats securely with double-sided non-slip rug tape.  Do not have throw rugs and other things on the floor that can make you trip. What can I do in the bedroom?  Use night lights.  Make sure that you have a light by your bed that is easy to reach.  Do not use any sheets or blankets that are too big for your bed. They should not hang down onto the floor.  Have a firm chair that has side arms. You can use this for support while you get dressed.  Do not have throw rugs and other things on the floor that can make you trip. What can I do in the kitchen?  Clean up any spills right away.  Avoid walking on wet floors.  Keep items that you use a lot in easy-to-reach places.  If you need to reach something above you, use a strong step stool that has a grab bar.  Keep electrical cords out of the way.  Do not use floor polish or wax that makes floors slippery. If you must use wax, use non-skid  floor wax.  Do not have throw rugs and other things on the floor that can make you trip. What can I do with my stairs?  Do not leave any items on the stairs.  Make sure that there are handrails on both sides of the stairs and use them. Fix handrails that are broken or loose. Make sure that handrails are as long as the stairways.  Check any carpeting to make sure that it is firmly attached to the stairs. Fix any carpet that is loose or worn.  Avoid having throw rugs at the top or bottom of the stairs. If you do have throw rugs, attach them to the floor with carpet tape.  Make sure that you have a light switch at the top of the stairs and the bottom of the stairs. If you do not have them, ask someone to add them for you. What else can I do to help prevent falls?  Wear shoes that:  Do not have high heels.  Have rubber bottoms.  Are comfortable and fit  you well.  Are closed at the toe. Do not wear sandals.  If you use a stepladder:  Make sure that it is fully opened. Do not climb a closed stepladder.  Make sure that both sides of the stepladder are locked into place.  Ask someone to hold it for you, if possible.  Clearly mark and make sure that you can see:  Any grab bars or handrails.  First and last steps.  Where the edge of each step is.  Use tools that help you move around (mobility aids) if they are needed. These include:  Canes.  Walkers.  Scooters.  Crutches.  Turn on the lights when you go into a dark area. Replace any light bulbs as soon as they burn out.  Set up your furniture so you have a clear path. Avoid moving your furniture around.  If any of your floors are uneven, fix them.  If there are any pets around you, be aware of where they are.  Review your medicines with your doctor. Some medicines can make you feel dizzy. This can increase your chance of falling. Ask your doctor what other things that you can do to help prevent falls. This information is not intended to replace advice given to you by your health care provider. Make sure you discuss any questions you have with your health care provider. Document Released: 04/13/2009 Document Revised: 11/23/2015 Document Reviewed: 07/22/2014 Elsevier Interactive Patient Education  2017 [employer].

## 2019-01-05 NOTE — Therapy (Signed)
[facility] [address], [place], [address] Phone: [phone]   Fax:  [phone]  Physical Therapy Treatment  Patient Details  Name: Andrea Holden MRN: [id number] Date of Birth: 22-Sep-1945 Referring Provider (PT): Fredderick Severance, NP    Encounter Date: 01/05/2019  PT End of Session - 01/05/19 1739    Visit Number  11    Number of Visits  24    Date for PT Re-Evaluation  02/09/19    Authorization Type  [facility] Medicare reporting period from 12/15/2018    Authorization Time Period  Current cert period 6/96/2952 - 02/09/2019 (latest PN: 12/15/2018)    Authorization - Visit Number  4    Authorization - Number of Visits  10    PT Start Time  1346    PT Stop Time  1429    PT Time Calculation (min)  43 min    Activity Tolerance  Patient tolerated treatment well;Patient limited by fatigue;Patient limited by pain    Behavior During Therapy  [facility] for tasks assessed/performed       Past Medical History:  Diagnosis Date  . Allergy    seasonal allergies  . Anemia 2014   needed 5 units of blood d/t passing out, weak  . Anxiety   . Aortic stenosis 12/24/2017   Echo Aug 2018  . Arthritis   . Asthma    allergy induced asthma  . Cardiac murmur 12/12/2017  . Cataract   . Cataract    left  . Complication of anesthesia    arrhythmia following colonoscopy  . Degenerative disc disease, lumbar   . Degenerative disc disease, lumbar   . Depression   . Diabetes mellitus   . Diabetic neuropathy ([place]) 11/16/2015  . Dysrhythmia    stenosis of left ventricle  . GERD (gastroesophageal reflux disease)   . H/O transfusion    patient was given 5 units of blood while at [facility], blood type O+  . History of chicken pox   . History of hiatal hernia   . History of measles, mumps, or rubella   . HOH (hard of hearing)    does not use hearing aides yet  . Hyperlipidemia   . Hypertension   . Irregular heartbeat   . LVH  (left ventricular hypertrophy) 12/24/2017   Echo Aug 2018  . Neuropathy   . Opiate use 11/16/2015  . Peripheral arterial disease ([place]) 05/06/2018   At rest, left > right; refer to vasc  . Pes planus of both feet 11/16/2015  . Plantar fasciitis of right foot 11/16/2015  . Wheezing     Past Surgical History:  Procedure Laterality Date  . ABDOMINAL HYSTERECTOMY    . APPLICATION OF WOUND VAC Left 07/23/2018   Procedure: APPLICATION OF WOUND VAC;  Surgeon: Algernon Huxley, MD;  Location: [facility] ORS;  Service: Vascular;  Laterality: Left;  . banck injections    . CATARACT EXTRACTION W/PHACO Right 05/30/2015   Procedure: CATARACT EXTRACTION PHACO AND INTRAOCULAR LENS PLACEMENT (IOC);  Surgeon: Birder Robson, MD;  Location: [facility] ORS;  Service: Ophthalmology;  Laterality: Right;  [place] 00:57 AP% 20.9 CDE 11.99 fluid pack lot #[id number]  . CHOLECYSTECTOMY  1970  . COLON SURGERY  2013   blocked colon  . COLONOSCOPY WITH PROPOFOL N/A 01/20/2018   Procedure: COLONOSCOPY WITH PROPOFOL;  Surgeon: Lucilla Lame, MD;  Location: [facility] ENDOSCOPY;  Service: Endoscopy;  Laterality: N/A;  . ENDARTERECTOMY FEMORAL Left 07/08/2018  Procedure: ENDARTERECTOMY FEMORAL;  Surgeon: Algernon Huxley, MD;  Location: [facility] ORS;  Service: Vascular;  Laterality: Left;  . EYE SURGERY Right    cataract surgery  . FOOT FUSION Right 2018   metal in foot  . GALLBLADDER SURGERY  1970  . GASTRIC BYPASS  2010   lost 178 lbs and regained 40 lbs last few years  . HUMERUS FRACTURE SURGERY Right    metal plate with screws  . internal bleeding  2016   ulcer in past  . LOWER EXTREMITY ANGIOGRAM Left 07/08/2018   Procedure: LOWER EXTREMITY ANGIOGRAM;  Surgeon: Algernon Huxley, MD;  Location: [facility] ORS;  Service: Vascular;  Laterality: Left;  . LOWER EXTREMITY ANGIOGRAPHY Left 05/27/2018   Procedure: LOWER EXTREMITY ANGIOGRAPHY;  Surgeon: Algernon Huxley, MD;  Location: [place] [facility];  Service: Cardiovascular;  Laterality: Left;  . LOWER  EXTREMITY ANGIOGRAPHY Right 12/07/2018   Procedure: LOWER EXTREMITY ANGIOGRAPHY;  Surgeon: Algernon Huxley, MD;  Location: [place] [facility];  Service: Cardiovascular;  Laterality: Right;  . MOUTH SURGERY     root canals and crowns and extractions  . OVARY SURGERY    . SKIN GRAFT Right 2018   RT foot. foot has been rebuilt.  it is full of metal  . TENOTOMY ACHILLES TENDON Right    Percuntaneous. metal in foot  . TONSILLECTOMY    . WOUND DEBRIDEMENT Left 07/23/2018   Procedure: DEBRIDEMENT WOUND;  Surgeon: Algernon Huxley, MD;  Location: [facility] ORS;  Service: Vascular;  Laterality: Left;    There were no vitals filed for this visit.  Subjective Assessment - 01/05/19 1354    Subjective  Patient reported that she has had her test results back and that her lower extremity circulation is "much improved" per MD. Still waiting to have her double back injections to address her pain but reported that she has to be off blood thinners for 7 days and that is prohibiting her from having the injections. Also had an echocardiogram performed.    Pertinent History  Patient is a 73 y.o. female who presents to outpatient physical therapy with a referral for medical diagnosis of degeneration of lumbar intervertebral disc. This patient's chief complaints consist of chronic low back pain and foot pain, weakness, stiffness, leading to the following functional deficits: difficulty with with basic ADLs, IADLs, ambulation. She used to get injections to help control back pain but it has recently worsened due to being unable to get injections during [place] pandemic. The patient has been informed of current processes in place at [facility]patient [facility] to protect patients from Covid-19 exposure including social distancing, schedule modifications, and new cleaning procedures. After discussing their particular risk with a therapist based on the patient's personal risk factors, the patient has decided to proceed with in-person therapy. Pt is  scheduled for R LE angiography on 12/07/2018. She has recently started smoking again with the stress of COVID19 pandemic and feels strongly she needs to stop. Surgeon has stated in the past he will not complete surgery if she is smoking.  Relevant past medical history and comorbidities include underwent extensive vascular surgeries in Jan 2020 to improve blood flow, similar surgery for R leg scheduled 12/07/2018. Has a history of left carotid artery stenosis, but not enough to have surgery, has a history of multiple arterial surgeries, history of R cataract surgery, colon surgery, cholecystectomy, R foot skin graft, ovary surgery, gastric bypass, R foot fusion, abdominal hysterectomy; current smoker, plantar fasciitis, peripheral arterial  disease, LVH, diabetes mellitus with neuropathy, irregular heartbeat, hiatal hernia, GERD, stenosis of left heart ventricle, depression, lumbar degenerative disc disease, cardiac murmur, asthma, anxiety, obesity, HTN, and seasonal allergies She has been advised not to lift over 10 pounds by physician    How long can you sit comfortably?  <1 hour    How long can you stand comfortably?  1 minute    How long can you walk comfortably?  2 minutes, depends on what she has with her. Able to walk at fair with rollator. To mailbox 120 feet causes shaking in R leg.      Diagnostic tests  MRI lumbar spine report 06/14/2017: "IMPRESSION: 1. Stable degenerative of lumbar spondylosis and scoliosis with multilevel disc disease and facet disease. 2. Stable right foraminal stenosis at L5-S1 due to right-sided disc    Patient Stated Goals  patient would like to get back to PLOF, walking further, be better able to participate in dog showing and trips    Currently in Pain?  Yes    Pain Score  9     Pain Location  Back    Pain Orientation  Lower    Pain Descriptors / Indicators  Aching;Sore;Discomfort    Pain Type  Chronic pain    Pain Onset  More than a month ago       TREATMENT:     Therapeutic exercise: to centralize symptoms and improve ROM, strength, muscular endurance, and activity tolerance required for successful completion of functional activities.  - NuStep level 2 using bilateral upper and lower extremities. Seat setting 8/handle setting 7. For improved extremity mobility, muscular endurance, and activity tolerance; and to induce the analgesic effect of aerobic exercise, stimulate improved joint nutrition, and prepare body structures and systems for following interventions. x 6 minutes with SPM above 60 during subjective exam.    - sidelying hip abduction 2x15 each side. Required physical assistance and cuing to get in true sidelying as well as to many proper exercise technique. Performed 1 set, performed all other exercises and then completed 15 more prior to standing. To improve single leg stance pelvis stability for ambulation.  - supine bridges with red theraband around distal thighs, 2x15 with cuing for full ROM. To strengthen hip extension for functional exercise. Cuing for proper form. Rest break needed due to fatigue and increased pain but able to complete.  - Prone hip extension with knee flexed to preferentially bias gluteus maximus. 2x15 each side with 2# dumbbell behind knee Cuing to keep knee flexed. To improve functional strength. Required physical assistance and cuing to get close to proper form.   - prone straight leg hip extension to abduction and back. x15 each way. To improve hip strength for ambulation. Cuing for proper motion.  - Education on [place] Access Code: [email or web address]  URL: [email or web address]  Date: 12/03/2018  Prepared by: Rosita Kea   Exercises   Sit to Stand without Arm Support - 30 reps - 2x daily   Standing Hip Hiking - 3 sets - 10 reps - 2x daily   Supine Bridge with Resistance Band - 3 sets - 10 reps - 1 second hold - 2x daily   Sidelying Hip Abduction - 3 sets - 10 reps - 2x daily    Prone Hip Extension with Bent Knee - 3 sets - 10 reps - 2x daily  Prone Hip Abduction on Slider - 3 sets - 10 reps - 2x daily  Clinical impression/pt response: Pt with pain decrease to 9/10 to 8/10 at end of session. Patient needed CGA/minA for tranfers on mat to maintain safety. Pt needed constant tactile cues to maintain exercise form to maximize proper muscle activation and avoid compensational movements. Pt limited by pain during session but willing to participate with PT as much as able. Session focused on promoting strength to increase stability while ambulating and to improve functional abilities. The patient would benefit from continued skilled PT to continue to progress towards goals.    PT Education - 01/05/19 1356    Education provided  Yes    Education Details  exercise form/technique, self management techniques    Person(s) Educated  Patient    Methods  Explanation;Demonstration;Tactile cues;Verbal cues    Comprehension  Verbalized understanding;Returned demonstration;Verbal cues required;Need further instruction       PT Short Term Goals - 01/05/19 1738      PT SHORT TERM GOAL #1   Title  Be independent with initial home exercise program for self-management of symptoms.    Baseline  Initial HEP provided at [facility] (11/17/2018); was completing until vascular procedure, re-instated 12/15/2018 (12/15/2018);    Time  2    Period  Weeks    Status  Partially Met    Target Date  01/19/19        PT Long Term Goals - 12/17/18 1422      PT LONG TERM GOAL #1   Title  Be independent with a long-term home exercise program for self-management of symptoms.     Baseline  Initial HEP provided at [facility] (11/17/2018); was doing better before vascular surgery, is working at returning to [facility] since procedure (12/15/2018).    Time  12    Period  Weeks    Status  Partially Met    Target Date  02/09/19      PT LONG TERM GOAL #2   Title  Patient will improve 6MWT to equal or greater than 1000  feet with LRAD to demonstrate improved activity tolerance for community ambulation.     Baseline  6 Minute Walk Test: 200 feet with SPC and supervision. Stopped at 1:43 due to fatigue and leg pain. (11/17/2018); 457 feet SPC, on seated and one standing break (12/15/2018);    Time  12    Period  Weeks    Status  Partially Met    Target Date  02/09/19      PT LONG TERM GOAL #3   Title  Patient will demonstrate improved 5 Times Sit to Stand test to 11 seconds from chair height with no UE support to demonstrate improvement in LE power and functional strength for daily activities.    Baseline  5 Time Sit to Stand: 27 seconds from chair height no UE support (11/17/2018); 18 seconds, no UE support from chair-high plinth (12/15/2018);    Time  12    Period  Weeks    Status  Partially Met    Target Date  02/09/19      PT LONG TERM GOAL #4   Title  Patient will ambulate faster than 1.8ms on the 10 Meter Walk Test to improve ability to cross street safey for community participation.     Baseline  testing deferred to next session (11/17/2018); 1.37 m/sec with SPC (11/24/2018); 1.3 m/sec with SPC (12/15/2018);    Time  12    Period  Weeks    Status  Achieved    Target Date  02/09/19  PT LONG TERM GOAL #5   Title  Patient will score equal or greater than  25/30 on the Functional Gait Assessment to demonstrate low fall risk to improve her ability to participate safely in community activities.     Baseline  testing deferred to next session (11/17/2018); 11/30 high fall risk (11/24/2018); unable to measure due to time limitation (12/15/2018); 13/30 high fall risk (12/17/2018);    Time  12    Period  Weeks    Status  Partially Met    Target Date  02/09/19            Plan - 01/05/19 1738    Personal Factors and Comorbidities  Age;Comorbidity 3+;Fitness;Time since onset of injury/illness/exacerbation;Past/Current Experience;Other    Comorbidities  underwent extensive vascular surgeries in Jan 2020 to  improve blood flow, similar surgery for R leg scheduled 12/07/2018. Has a history of left carotid artery stenosis, but not enough to have surgery, has a history of multiple arterial surgeries, history of R cataract surgery, colon surgery, cholecystectomy, R foot skin graft, ovary surgery, gastric bypass, R foot fusion, abdominal hysterectomy; current smoker, plantar fasciitis, peripheral arterial disease, LVH, diabetes mellitus with neuropathy, irregular heartbeat, hiatal hernia, GERD, stenosis of left heart ventricle, depression, lumbar degenerative disc disease, cardiac murmur, asthma, anxiety, obesity, HTN, and seasonal allergies She has been advised not to lift over 10 pounds by physician.      Examination-Activity Limitations  Bathing;Carry;Lift;Sit;Stand;Locomotion Level;Toileting;Dressing;Squat;Transfers;Stairs;Hygiene/Grooming;Other    Examination-Participation Restrictions  Community Activity;Volunteer;Cleaning;Interpersonal Relationship;Yard Work;Other    Rehab Potential  Fair    Clinical Impairments Affecting Rehab Potential  (+) motivated (-) muliple co morbidities, chronic condition    PT Frequency  2x / week    PT Duration  12 weeks    PT Treatment/Interventions  Moist Heat;Patient/family education;Neuromuscular re-education;Therapeutic exercise;Manual techniques;ADLs/Self Care Home Management;Electrical Stimulation;Gait training;Stair training;Functional mobility training;Therapeutic activities;Balance training;Passive range of motion;Dry needling;Energy conservation;Spinal Manipulations;Joint Manipulations;Cryotherapy    PT Next Visit Plan  continue with progressive LE and  functional strengtheing    PT Home Exercise Plan  Medbridge Access Code: [email or web address]    Consulted and Agree with Plan of Care  Patient       Patient will benefit from skilled therapeutic intervention in order to improve the following deficits and impairments:  Decreased strength, Impaired flexibility, Decreased activity  tolerance, Impaired perceived functional ability, Pain, Decreased endurance, Difficulty walking, Abnormal gait, Decreased knowledge of use of DME, Decreased skin integrity, Decreased range of motion, Impaired sensation, Improper body mechanics, Obesity, Postural dysfunction, Increased edema, Decreased mobility, Decreased balance, Cardiopulmonary status limiting activity, Decreased coordination  Visit Diagnosis: 1. Muscle weakness (generalized)   2. Chronic bilateral low back pain, unspecified whether sciatica present   3. Difficulty in walking, not elsewhere classified   4. History of falling   5. Chronic bilateral low back pain with right-sided sciatica   6. Chronic bilateral low back pain with sciatica, sciatica laterality unspecified        Problem List Patient Active Problem List   Diagnosis Date Noted  . Lower extremity edema 07/31/2018  . Pressure injury of skin 07/24/2018  . Surgical site infection 07/23/2018  . Wound of left leg 07/23/2018  . Atherosclerosis of artery of extremity with rest pain ([place]) 07/08/2018  . Shortness of breath 06/02/2018  . Bruit 06/02/2018  . Coronary artery disease of native artery of native heart with stable angina pectoris ([place]) 06/02/2018  . Atherosclerosis of native arteries of extremity with intermittent claudication ([place]) 05/19/2018  . Peripheral  arterial disease ([place]) 05/06/2018  . Personal history of colonic polyps   . Aortic stenosis 12/24/2017  . LVH (left ventricular hypertrophy) 12/24/2017  . Hypertension with heart disease 12/24/2017  . Left atrial dilatation 12/12/2017  . Cardiac murmur 12/12/2017  . Gastrojejunal ulcer 12/04/2017  . Hyperphosphatemia 12/04/2017  . Spinal stenosis of lumbar region 06/18/2017  . Degeneration of lumbar intervertebral disc 06/02/2017  . Assistance needed with transportation 05/26/2017  . Financial difficulties 05/26/2017  . Needs assistance with community resources 05/26/2017  . Moderate recurrent  major depression ([place]) 05/26/2017  . Non-healing wound of lower extremity 02/06/2017  . Status post ankle arthrodesis 12/11/2016  . Controlled substance agreement broken 10/11/2016  . Low back pain 09/25/2016  . Osteoarthritis of right subtalar joint 07/11/2016  . Posterior tibial tendinitis of right lower extremity 07/11/2016  . Breast cancer screening 06/18/2016  . Knee pain 04/03/2016  . Hyponatremia 03/01/2016  . High triglycerides 12/24/2015  . Pes planus of both feet 11/16/2015  . Plantar fasciitis of right foot 11/16/2015  . Heel spur 11/16/2015  . Diabetic neuropathy ([place]) 11/16/2015  . Right ankle pain 11/16/2015  . Medication monitoring encounter 11/16/2015  . Chronic pain of multiple joints 08/15/2015  . Abnormal mammogram of right breast 06/19/2015  . Cerumen impaction 03/16/2015  . Hyperlipidemia 12/14/2014  . History of transfusion of packed RBC 12/14/2014  . Hx of smoking 12/14/2014  . Status post bariatric surgery 12/14/2014  . Morbid obesity ([place]) 12/14/2014  . Chronic radicular lumbar pain 12/14/2014  . History of GI bleed 11/12/2014  . GERD without esophagitis 11/12/2014  . History of small bowel obstruction 09/07/2014    Lieutenant Diego PT, DPT 5:53 PM,01/05/19 [phone]  [facility] [address] [place], [address] Phone: [phone]   Fax:  [phone]  Name: Andrea Holden MRN: [id number] Date of Birth: 11-06-1945

## 2019-01-06 ENCOUNTER — Encounter (INDEPENDENT_AMBULATORY_CARE_PROVIDER_SITE_OTHER): Payer: Self-pay | Admitting: Nurse Practitioner

## 2019-01-06 NOTE — Progress Notes (Signed)
SUBJECTIVE:  Patient ID: Andrea Holden, female    DOB: 09-03-1945, 73 y.o.   MRN: [id number] Chief Complaint  Patient presents with  . Follow-up    [facility] 4week ABI    HPI  Andrea Holden is a 73 y.o. female The patient returns to the office for followup and review status post angiogram with intervention. The patient notes improvement in the lower extremity symptoms. No interval shortening of the patient's claudication distance or rest pain symptoms. Previous wounds have now healed.  No new ulcers or wounds have occurred since the last visit.   The patient underwent right PTA with stent on 12/07/2018 and also previous left femoral endarterectomy on 07/08/2018.    The patient is also undergoing an upcoming spinal procedure that will require stopping of anticoagulation for a short time.    There have been no significant changes to the patient's overall health care.  The patient denies amaurosis fugax or recent TIA symptoms. There are no recent neurological changes noted. The patient denies history of DVT, PE or superficial thrombophlebitis. The patient denies recent episodes of angina or shortness of breath.   ABI's Rt=0.89 and Lt=0.70  (previous ABI's Rt=0.57 and Lt=0.43) Duplex US of the tibial arteries reveals biphasic waveforms in the right lower extremity, the left has monophasic waveforms in the anterior  tibial arteries and biphasic in the posterior tibial arteries.    Past Medical History:  Diagnosis Date  . Allergy    seasonal allergies  . Anemia 2014   needed 5 units of blood d/t passing out, weak  . Anxiety   . Aortic stenosis 12/24/2017   Echo Aug 2018  . Arthritis   . Asthma    allergy induced asthma  . Cardiac murmur 12/12/2017  . Cataract   . Cataract    left  . Complication of anesthesia    arrhythmia following colonoscopy  . Degenerative disc disease, lumbar   . Degenerative disc disease, lumbar   . Depression   . Diabetes mellitus   . Diabetic  neuropathy (HCC) 11/16/2015  . Dysrhythmia    stenosis of left ventricle  . GERD (gastroesophageal reflux disease)   . H/O transfusion    patient was [facility] units [facility], blood type O+  . History of chicken pox   . History of hiatal hernia   . History of measles, mumps, or rubella   . HOH (hard of hearing)    does not use hearing aides yet  . Hyperlipidemia   . Hypertension   . Irregular heartbeat   . LVH (left ventricular hypertrophy) 12/24/2017   Echo Aug 2018  . Neuropathy   . Opiate use 11/16/2015  . Peripheral arterial disease (HCC) 05/06/2018   At rest, left > right; refer to vasc  . Pes planus of both feet 11/16/2015  . Plantar fasciitis of right foot 11/16/2015  . Wheezing     Past Surgical History:  Procedure Laterality Date  . ABDOMINAL HYSTERECTOMY    . APPLICATION OF WOUND VAC Left 07/23/2018   Procedure: APPLICATION OF WOUND VAC;  Surgeon: Annice Needyew, Jason S, MD;  Location: [facility] ORS;  Service: Vascular;  Laterality: Left;  . banck injections    . CATARACT EXTRACTION W/PHACO Right 05/30/2015   Procedure: CATARACT EXTRACTION PHACO AND INTRAOCULAR LENS PLACEMENT (IOC);  Surgeon: Galen ManilaWilliam Porfilio, MD;  Location: [facility] ORS;  Service: Ophthalmology;  Laterality: Right;  US 00:57 AP% 20.9 CDE 11.99 fluid pack lot #[id number]  .  CHOLECYSTECTOMY  1970  . COLON SURGERY  2013   blocked colon  . COLONOSCOPY WITH PROPOFOL N/A 01/20/2018   Procedure: COLONOSCOPY WITH PROPOFOL;  Surgeon: Midge MiniumWohl, Darren, MD;  [facility];  Service: Endoscopy;  Laterality: N/A;  . ENDARTERECTOMY FEMORAL Left 07/08/2018   Procedure: ENDARTERECTOMY FEMORAL;  Surgeon: Annice Needyew, Jason S, MD;  Location: [facility] ORS;  Service: Vascular;  Laterality: Left;  . EYE SURGERY Right    cataract surgery  . FOOT FUSION Right 2018   metal in foot  . GALLBLADDER SURGERY  1970  . GASTRIC BYPASS  2010   lost 178 lbs and regained 40 lbs last few years  . HUMERUS FRACTURE SURGERY Right    metal plate  with screws  . internal bleeding  2016   ulcer in past  . LOWER EXTREMITY ANGIOGRAM Left 07/08/2018   Procedure: LOWER EXTREMITY ANGIOGRAM;  Surgeon: Annice Needyew, Jason S, MD;  Location: [facility] ORS;  Service: Vascular;  Laterality: Left;  . LOWER EXTREMITY ANGIOGRAPHY Left 05/27/2018   Procedure: LOWER EXTREMITY ANGIOGRAPHY;  Surgeon: Annice Needyew, Jason S, MD;  Location: [facility] INVASIVE CV LAB;  Service: Cardiovascular;  Laterality: Left;  . LOWER EXTREMITY ANGIOGRAPHY Right 12/07/2018   Procedure: LOWER EXTREMITY ANGIOGRAPHY;  Surgeon: Annice Needyew, Jason S, MD;  Location: [facility] INVASIVE CV LAB;  Service: Cardiovascular;  Laterality: Right;  . MOUTH SURGERY     root canals and crowns and extractions  . OVARY SURGERY    . SKIN GRAFT Right 2018   RT foot. foot has been rebuilt.  it is full of metal  . TENOTOMY ACHILLES TENDON Right    Percuntaneous. metal in foot  . TONSILLECTOMY    . WOUND DEBRIDEMENT Left 07/23/2018   Procedure: DEBRIDEMENT WOUND;  Surgeon: Annice Needyew, Jason S, MD;  Location: [facility] ORS;  Service: Vascular;  Laterality: Left;    Social History   Socioeconomic History  . Marital status: Divorced    Spouse name: Not on file  . Number of children: 1  . Years of education: Not on file  . Highest education level: Associate degree: academic program  Occupational History  . Occupation: Lawyerubstitute Teacher    Comment: [employer]  Social Needs  . Financial resource strain: Very hard  . Food insecurity    Worry: Never true    Inability: Never true  . Transportation needs    Medical: No    Non-medical: No  Tobacco Use  . Smoking status: Current Some Day Smoker    Packs/day: 0.50    Years: 55.00    Pack years: 27.50    Types: Cigarettes    Start date: 07/01/1960    Last attempt to quit: 11/22/2018    Years since quitting: 0.1  . Smokeless tobacco: Never Used  . Tobacco comment: trying to quit again, using chantix  Substance and Sexual Activity  . Alcohol use: Yes     Alcohol/week: 0.0 standard drinks    Comment: 2 drinks per month  . Drug use: Not Currently    Types: Marijuana    Comment: used for pain  . Sexual activity: Not Currently  Lifestyle  . Physical activity    Days per week: 7 days    Minutes per session: 10 min  . Stress: To some extent  Relationships  . Social Musicianconnections    Talks on phone: Three times a week    Gets together: Once a week    Attends religious service: 1 to 4 times per year  Active member of club or organization: Yes    Attends meetings of clubs or organizations: More than 4 times per year    Relationship status: Divorced  . Intimate partner violence    Fear of current or ex partner: No    Emotionally abused: No    Physically abused: No    Forced sexual activity: No  Other Topics Concern  . Not on file  Social History Narrative  . Not on file    Family History  Problem Relation Age of Onset  . Asthma Mother   . COPD Mother   . Arthritis Father   . Depression Father   . Heart disease Father   . Hypertension Father   . Stroke Father   . Heart attack Father   . Arthritis Brother   . Depression Brother   . Diabetes Brother   . Heart disease Brother   . Hyperlipidemia Brother   . Hypertension Brother   . Stroke Brother   . Vision loss Brother   . Heart attack Brother   . Diabetes Maternal Grandmother   . Thyroid disease Daughter   . Colitis Daughter   . Breast cancer Neg Hx     Allergies  Allergen Reactions  . Meloxicam Other (See Comments)    GI bleeding  . Nsaids Other (See Comments)    Gi bleeding  . Sitagliptin Hives and Itching    Januvia      Review of Systems   Review of Systems: Negative Unless Checked Constitutional: [] Weight loss  [] Fever  [] Chills Cardiac: [] Chest pain   []  Atrial Fibrillation  [] Palpitations   [] Shortness of breath when laying flat   [] Shortness of breath with exertion. [] Shortness of breath at rest Vascular:  [] Pain in legs with walking   [] Pain in legs  with standing [] Pain in legs when laying flat   [] Claudication    [] Pain in feet when laying flat    [] History of DVT   [] Phlebitis   [] Swelling in legs   [] Varicose veins   [] Non-healing ulcers Pulmonary:   [] Uses home oxygen   [] Productive cough   [] Hemoptysis   [] Wheeze  [x] COPD   [] Asthma Neurologic:  [] Dizziness   [] Seizures  [] Blackouts [] History of stroke   [] History of TIA  [] Aphasia   [] Temporary Blindness   [] Weakness or numbness in arm   [x] Weakness or numbness in leg Musculoskeletal:   [] Joint swelling   [x] Joint pain   [x] Low back pain  []  History of Knee Replacement [] Arthritis [x] back Surgeries  []  Spinal Stenosis    Hematologic:  [] Easy bruising  [] Easy bleeding   [] Hypercoagulable state   [] Anemic Gastrointestinal:  [] Diarrhea   [] Vomiting  [x] Gastroesophageal reflux/heartburn   [] Difficulty swallowing. [] Abdominal pain Genitourinary:  [] Chronic kidney disease   [] Difficult urination  [] Anuric   [] Blood in urine [] Frequent urination  [] Burning with urination   [] Hematuria Skin:  [] Rashes   [] Ulcers [x] Wounds Psychological:  [x] History of anxiety   []  History of major depression  []  Memory Difficulties      OBJECTIVE:   Physical Exam  BP (!) 162/54 (BP Location: Right Arm)   Pulse 77   Resp 16   Wt 207 lb (93.9 kg)   BMI 39.11 kg/m   Gen: WD/WN, NAD Head: [place]/AT, No temporalis wasting.  Ear/Nose/Throat: Hearing grossly intact, nares w/o erythema or drainage Eyes: PER, EOMI, sclera nonicteric.  Neck: Supple, no masses.  No JVD.  Pulmonary:  Good air movement, no use of accessory muscles.  Cardiac:  RRR Vascular:  Vessel Right Left  Radial Palpable Palpable  Dorsalis Pedis Trace Palpable Trace Palpable  Posterior Tibial Trace Palpable Trace Palpable   Gastrointestinal: soft, non-distended. No guarding/no peritoneal signs.  Musculoskeletal: M/S 5/5 throughout.  No deformity or atrophy.  Neurologic: Pain and light touch intact in extremities.  Symmetrical.  Speech is  fluent. Motor exam as listed above. Psychiatric: Judgment intact, Mood & affect appropriate for pt's clinical situation. Dermatologic: No Venous rashes. No Ulcers Noted.  No changes consistent with cellulitis. Lymph : No Cervical lymphadenopathy, no lichenification or skin changes of chronic lymphedema.       ASSESSMENT AND PLAN:  1. Atherosclerosis of artery of extremity with rest pain (HCC) Recommend:  The patient is status post successful angiogram with intervention.  The patient reports that the claudication symptoms are much less and tolerable.  The patient denies lifestyle limiting changes at this point in time.  No further invasive studies, angiography or surgery at this time The patient should continue walking and begin a more formal exercise program.  The patient should continue antiplatelet therapy and aggressive treatment of the lipid abnormalities  Patient has upcoming epidural procedure.  Advised to delay until 01/20/19 for procedure.  Also made sure to discuss signs and symptoms of lower extremity ischemia and advised that immediate medical attention would be needed.  Patient understood.    Smoking cessation was again discussed  The patient should continue wearing graduated compression socks 10-15 mmHg strength to control the mild edema.  Patient should undergo noninvasive studies as ordered. The patient will follow up with me after the studies.   - VAS [place] ABI WITH/WO TBI; Future - VAS [place] LOWER EXTREMITY ARTERIAL DUPLEX; Future  2. Osteoarthritis of right subtalar joint Continue NSAID medications as already ordered, these medications have been reviewed and there are no changes at this time.  Continued activity and therapy was stressed.   3. Hypertension with heart disease Continue antihypertensive medications as already ordered, these medications have been reviewed and there are no changes at this time.   4. Hx of smoking Smoking cessation was discussed, 3-10  minutes spent on this topic specifically    Current Outpatient Medications on File Prior to Visit  Medication Sig Dispense Refill  . acetaminophen (TYLENOL) 325 MG tablet Take 325 mg by mouth every 6 (six) hours as needed.     Marland Kitchen albuterol (PROVENTIL HFA;VENTOLIN HFA) 108 (90 Base) MCG/ACT inhaler Inhale 2 puffs into the lungs every 6 (six) hours as needed for wheezing or shortness of breath. 1 Inhaler 1  . atorvastatin (LIPITOR) 10 MG tablet Take 1 tablet (10 mg total) by mouth at bedtime. 90 tablet 1  . clopidogrel (PLAVIX) 75 MG tablet TAKE 1 TABLET BY MOUTH ONCE DAILY 90 tablet 0  . co-enzyme Q-10 30 MG capsule Take 100 mg by mouth daily.     . fluticasone (FLONASE) 50 MCG/ACT nasal spray Place 2 sprays into both nostrils daily as needed. 16 g 4  . gabapentin (NEURONTIN) 400 MG capsule Take 1 capsule (400 mg total) by mouth 2 (two) times daily. 180 capsule 1  . Glucosamine-Chondroit-Vit C-Mn (GLUCOSAMINE 1500 COMPLEX) CAPS Take 1 capsule by mouth daily. 90 capsule 1  . HYDROcodone-acetaminophen (NORCO) 5-325 MG tablet Take 0.5 tablets by mouth every 6 (six) hours as needed for moderate pain. 30 tablet 0  . lisinopril (ZESTRIL) 10 MG tablet Take 1 tablet (10 mg total) by mouth daily. 90 tablet 1  . Melatonin 5 MG TABS  Take 1 tablet by mouth at bedtime.     . Multiple Vitamins-Minerals (MULTIVITAMIN ADULTS 50+) TABS Take 1 tablet by mouth daily. 90 tablet 1  . pantoprazole (PROTONIX) 20 MG tablet TAKE 1 TABLET BY MOUTH ONCE DAILY 30 tablet 0  . sertraline (ZOLOFT) 100 MG tablet Take 1.5 tablets (150 mg total) by mouth daily. 135 tablet 1  . vitamin C (ASCORBIC ACID) 500 MG tablet Take 500 mg by mouth daily.      No current facility-administered medications on file prior to visit.     There are no Patient Instructions on file for this visit. Return in about 3 months (around 04/06/2019) for PAD.   Kris Hartmann, NP  This note was completed with Sales executive.  Any errors are purely  unintentional.

## 2019-01-07 ENCOUNTER — Ambulatory Visit: Payer: Medicare Other

## 2019-01-07 ENCOUNTER — Ambulatory Visit (INDEPENDENT_AMBULATORY_CARE_PROVIDER_SITE_OTHER): Payer: Medicare (Managed Care) | Admitting: Nurse Practitioner

## 2019-01-07 ENCOUNTER — Other Ambulatory Visit: Payer: Self-pay

## 2019-01-07 ENCOUNTER — Encounter (INDEPENDENT_AMBULATORY_CARE_PROVIDER_SITE_OTHER): Payer: Medicare (Managed Care)

## 2019-01-07 DIAGNOSIS — R262 Difficulty in walking, not elsewhere classified: Secondary | ICD-10-CM | POA: Diagnosis not present

## 2019-01-07 DIAGNOSIS — M6281 Muscle weakness (generalized): Secondary | ICD-10-CM | POA: Diagnosis not present

## 2019-01-07 DIAGNOSIS — M62838 Other muscle spasm: Secondary | ICD-10-CM

## 2019-01-07 DIAGNOSIS — M5440 Lumbago with sciatica, unspecified side: Secondary | ICD-10-CM | POA: Diagnosis not present

## 2019-01-07 DIAGNOSIS — Z9181 History of falling: Secondary | ICD-10-CM | POA: Diagnosis not present

## 2019-01-07 DIAGNOSIS — G8929 Other chronic pain: Secondary | ICD-10-CM

## 2019-01-07 DIAGNOSIS — M5441 Lumbago with sciatica, right side: Secondary | ICD-10-CM | POA: Diagnosis not present

## 2019-01-07 DIAGNOSIS — M545 Low back pain: Secondary | ICD-10-CM | POA: Diagnosis not present

## 2019-01-07 NOTE — Therapy (Signed)
[facility] [address], [place], [address] Phone: [phone]   Fax:  [phone]  Physical Therapy Treatment  Patient Details  Name: Andrea Holden MRN: [id number] Date of Birth: 1946/05/29 Referring Provider (PT): Fredderick Severance, NP    Encounter Date: 01/07/2019  PT End of Session - 01/07/19 1424    Visit Number  12    Number of Visits  24    Date for PT Re-Evaluation  02/09/19    Authorization Type  [facility] Medicare reporting period from 12/15/2018    Authorization Time Period  Current cert period 4/74/2595 - 02/09/2019 (latest PN: 12/15/2018)    Authorization - Visit Number  5    Authorization - Number of Visits  10    PT Start Time  1345   Pt 15 minutes late   PT Stop Time  1415    PT Time Calculation (min)  30 min    Activity Tolerance  Patient tolerated treatment well;Patient limited by fatigue;Patient limited by pain    Behavior During Therapy  [facility] for tasks assessed/performed       Past Medical History:  Diagnosis Date  . Allergy    seasonal allergies  . Anemia 2014   needed 5 units of blood d/t passing out, weak  . Anxiety   . Aortic stenosis 12/24/2017   Echo Aug 2018  . Arthritis   . Asthma    allergy induced asthma  . Cardiac murmur 12/12/2017  . Cataract   . Cataract    left  . Complication of anesthesia    arrhythmia following colonoscopy  . Degenerative disc disease, lumbar   . Degenerative disc disease, lumbar   . Depression   . Diabetes mellitus   . Diabetic neuropathy ([place]) 11/16/2015  . Dysrhythmia    stenosis of left ventricle  . GERD (gastroesophageal reflux disease)   . H/O transfusion    patient was given 5 units of blood while at [facility], blood type O+  . History of chicken pox   . History of hiatal hernia   . History of measles, mumps, or rubella   . HOH (hard of hearing)    does not use hearing aides yet  . Hyperlipidemia   . Hypertension   . Irregular  heartbeat   . LVH (left ventricular hypertrophy) 12/24/2017   Echo Aug 2018  . Neuropathy   . Opiate use 11/16/2015  . Peripheral arterial disease ([place]) 05/06/2018   At rest, left > right; refer to vasc  . Pes planus of both feet 11/16/2015  . Plantar fasciitis of right foot 11/16/2015  . Wheezing     Past Surgical History:  Procedure Laterality Date  . ABDOMINAL HYSTERECTOMY    . APPLICATION OF WOUND VAC Left 07/23/2018   Procedure: APPLICATION OF WOUND VAC;  Surgeon: Algernon Huxley, MD;  Location: [facility] ORS;  Service: Vascular;  Laterality: Left;  . banck injections    . CATARACT EXTRACTION W/PHACO Right 05/30/2015   Procedure: CATARACT EXTRACTION PHACO AND INTRAOCULAR LENS PLACEMENT (IOC);  Surgeon: Birder Robson, MD;  Location: [facility] ORS;  Service: Ophthalmology;  Laterality: Right;  [place] 00:57 AP% 20.9 CDE 11.99 fluid pack lot #[id number]  . CHOLECYSTECTOMY  1970  . COLON SURGERY  2013   blocked colon  . COLONOSCOPY WITH PROPOFOL N/A 01/20/2018   Procedure: COLONOSCOPY WITH PROPOFOL;  Surgeon: Lucilla Lame, MD;  [facility] END[facility];  Service: Endoscopy;  Laterality: N/A;  .  ENDARTERECTOMY FEMORAL Left 07/08/2018   Procedure: ENDARTERECTOMY FEMORAL;  Surgeon: Algernon Huxley, MD;  Location: [facility] ORS;  Service: Vascular;  Laterality: Left;  . EYE SURGERY Right    cataract surgery  . FOOT FUSION Right 2018   metal in foot  . GALLBLADDER SURGERY  1970  . GASTRIC BYPASS  2010   lost 178 lbs and regained 40 lbs last few years  . HUMERUS FRACTURE SURGERY Right    metal plate with screws  . internal bleeding  2016   ulcer in past  . LOWER EXTREMITY ANGIOGRAM Left 07/08/2018   Procedure: LOWER EXTREMITY ANGIOGRAM;  Surgeon: Algernon Huxley, MD;  Location: [facility] ORS;  Service: Vascular;  Laterality: Left;  . LOWER EXTREMITY ANGIOGRAPHY Left 05/27/2018   Procedure: LOWER EXTREMITY ANGIOGRAPHY;  Surgeon: Algernon Huxley, MD;  Location: [facility];  Service: Cardiovascular;  Laterality:  Left;  . LOWER EXTREMITY ANGIOGRAPHY Right 12/07/2018   Procedure: LOWER EXTREMITY ANGIOGRAPHY;  Surgeon: Algernon Huxley, MD;  Location: [facility];  Service: Cardiovascular;  Laterality: Right;  . MOUTH SURGERY     root canals and crowns and extractions  . OVARY SURGERY    . SKIN GRAFT Right 2018   RT foot. foot has been rebuilt.  it is full of metal  . TENOTOMY ACHILLES TENDON Right    Percuntaneous. metal in foot  . TONSILLECTOMY    . WOUND DEBRIDEMENT Left 07/23/2018   Procedure: DEBRIDEMENT WOUND;  Surgeon: Algernon Huxley, MD;  Location: [facility] ORS;  Service: Vascular;  Laterality: Left;    There were no vitals filed for this visit.  Subjective Assessment - 01/07/19 1350    Subjective  Patient reported after her last PT session she was more sore, but reported that sometimes her back is just more sore.    Pertinent History  Patient is a 73 y.o. female who presents to outpatient physical therapy with a referral for medical diagnosis of degeneration of lumbar intervertebral disc. This patient's chief complaints consist of chronic low back pain and foot pain, weakness, stiffness, leading to the following functional deficits: difficulty with with basic ADLs, IADLs, ambulation. She used to get injections to help control back pain but it has recently worsened due to being unable to get injections during [place] pandemic. The patient has been informed of current processes in place at [facility]patient [facility] to protect patients from Covid-19 exposure including social distancing, schedule modifications, and new cleaning procedures. After discussing their particular risk with a therapist based on the patient's personal risk factors, the patient has decided to proceed with in-person therapy. Pt is scheduled for R LE angiography on 12/07/2018. She has recently started smoking again with the stress of COVID19 pandemic and feels strongly she needs to stop. Surgeon has stated in the past he will not complete surgery  if she is smoking.  Relevant past medical history and comorbidities include underwent extensive vascular surgeries in Jan 2020 to improve blood flow, similar surgery for R leg scheduled 12/07/2018. Has a history of left carotid artery stenosis, but not enough to have surgery, has a history of multiple arterial surgeries, history of R cataract surgery, colon surgery, cholecystectomy, R foot skin graft, ovary surgery, gastric bypass, R foot fusion, abdominal hysterectomy; current smoker, plantar fasciitis, peripheral arterial disease, LVH, diabetes mellitus with neuropathy, irregular heartbeat, hiatal hernia, GERD, stenosis of left heart ventricle, depression, lumbar degenerative disc disease, cardiac murmur, asthma, anxiety, obesity, HTN, and seasonal allergies She has been  advised not to lift over 10 pounds by physician    Limitations  Sitting;Standing;Walking;House hold activities;Lifting    How long can you sit comfortably?  <1 hour    How long can you stand comfortably?  1 minute    How long can you walk comfortably?  2 minutes, depends on what she has with her. Able to walk at fair with rollator. To mailbox 120 feet causes shaking in R leg.      Diagnostic tests  MRI lumbar spine report 06/14/2017: "IMPRESSION: 1. Stable degenerative of lumbar spondylosis and scoliosis with multilevel disc disease and facet disease. 2. Stable right foraminal stenosis at L5-S1 due to right-sided disc    Patient Stated Goals  patient would like to get back to PLOF, walking further, be better able to participate in dog showing and trips    Currently in Pain?  Yes    Pain Score  6     Pain Location  Back    Pain Orientation  Lower    Pain Descriptors / Indicators  Sharp    Pain Type  Chronic pain    Pain Onset  More than a month ago       TREATMENT:    Therapeutic exercise: to centralize symptoms and improve ROM, strength, muscular endurance, and activity tolerance required for successful completion of functional  activities.  - NuStep level 2 using bilateral upper and lower extremities. Seat setting 8/handle setting 7. For improved extremity mobility, muscular endurance, and activity tolerance; and to induce the analgesic effect of aerobic exercise, stimulate improved joint nutrition, and prepare body structures and systems for following interventions. x 6 minutes with SPM above 60 during subjective exam.    - sidelying hip abduction x10 each side. Required physical assistance and cuing to get in true sidelying as well as to many proper exercise technique. Performed 1 set due to pt reported pain, fatigue and increased discomfort after previous PT session.     - Prone hip extension with knee flexed to preferentially bias gluteus maximus. x10 each side with 2# dumbbell behind knee Cuing to keep knee flexed. To improve functional strength. Required physical assistance and cuing to get close to proper form.    - prone straight leg hip extension  x10 each way. To improve hip strength for ambulation. Cuing for proper motion.  -prone hip abduction  x10 ea way to improve hip strength and ambulation, cueing for proper motion   - Education on [place] Access Code: [email or web address]  URL: [email or web address]  Date: 12/03/2018  Prepared by: Rosita Kea   Exercises   Sit to Stand without Arm Support - 30 reps - 2x daily   Standing Hip Hiking - 3 sets - 10 reps - 2x daily   Supine Bridge with Resistance Band - 3 sets - 10 reps - 1 second hold - 2x daily   Sidelying Hip Abduction - 3 sets - 10 reps - 2x daily   Prone Hip Extension with Bent Knee - 3 sets - 10 reps - 2x daily  Prone Hip Abduction on Slider - 3 sets - 10 reps - 2x daily     Clinical impression/pt response: PT and pt agreeable to less repetitions this session due to increased pain after last session to avoid exacerbation of symptoms. Encouraged to perform HEP over the week to maintain progress and plan to return  to progressing exercises as able next session. Pt and PT also discussed sleeping position  to encourage proper alignment. Overall the patient would benefit from further skilled PT intervention to return to progression of program, pain management and for preparation of self management of condition.     PT Education - 01/07/19 1351    Education provided  Yes    Education Details  exercise form/technique, self management techniques HEP    Person(s) Educated  Patient    Methods  Explanation;Demonstration;Tactile cues;Verbal cues    Comprehension  Verbalized understanding;Returned demonstration;Verbal cues required;Need further instruction       PT Short Term Goals - 01/05/19 1738      PT SHORT TERM GOAL #1   Title  Be independent with initial home exercise program for self-management of symptoms.    Baseline  Initial HEP provided at [facility] (11/17/2018); was completing until vascular procedure, re-instated 12/15/2018 (12/15/2018);    Time  2    Period  Weeks    Status  Partially Met    Target Date  01/19/19        PT Long Term Goals - 12/17/18 1422      PT LONG TERM GOAL #1   Title  Be independent with a long-term home exercise program for self-management of symptoms.     Baseline  Initial HEP provided at [facility] (11/17/2018); was doing better before vascular surgery, is working at returning to [facility] since procedure (12/15/2018).    Time  12    Period  Weeks    Status  Partially Met    Target Date  02/09/19      PT LONG TERM GOAL #2   Title  Patient will improve 6MWT to equal or greater than 1000 feet with LRAD to demonstrate improved activity tolerance for community ambulation.     Baseline  6 Minute Walk Test: 200 feet with SPC and supervision. Stopped at 1:43 due to fatigue and leg pain. (11/17/2018); 457 feet SPC, on seated and one standing break (12/15/2018);    Time  12    Period  Weeks    Status  Partially Met    Target Date  02/09/19      PT LONG TERM GOAL #3   Title  Patient will  demonstrate improved 5 Times Sit to Stand test to 11 seconds from chair height with no UE support to demonstrate improvement in LE power and functional strength for daily activities.    Baseline  5 Time Sit to Stand: 27 seconds from chair height no UE support (11/17/2018); 18 seconds, no UE support from chair-high plinth (12/15/2018);    Time  12    Period  Weeks    Status  Partially Met    Target Date  02/09/19      PT LONG TERM GOAL #4   Title  Patient will ambulate faster than 1.45ms on the 10 Meter Walk Test to improve ability to cross street safey for community participation.     Baseline  testing deferred to next session (11/17/2018); 1.37 m/sec with SPC (11/24/2018); 1.3 m/sec with SPC (12/15/2018);    Time  12    Period  Weeks    Status  Achieved    Target Date  02/09/19      PT LONG TERM GOAL #5   Title  Patient will score equal or greater than  25/30 on the Functional Gait Assessment to demonstrate low fall risk to improve her ability to participate safely in community activities.     Baseline  testing deferred to next session (11/17/2018); 11/30 high fall  risk (11/24/2018); unable to measure due to time limitation (12/15/2018); 13/30 high fall risk (12/17/2018);    Time  12    Period  Weeks    Status  Partially Met    Target Date  02/09/19            Plan - 01/07/19 1419    Clinical Impression Statement  PT and pt agreeable to less repetitions this session due to increased pain after last session to avoid exacerbation of symptoms. Encouraged to perform HEP over the week to maintain progress and plan to return to progressing exercises as able next session. Pt and PT also discussed sleeping position to encourage proper alignment. Overall the patient would benefit from further skilled PT intervention to return to progression of program, pain management and for preparation of self management of condition.    Personal Factors and Comorbidities  Age;Comorbidity 3+;Fitness;Time since onset  of injury/illness/exacerbation;Past/Current Experience;Other    Comorbidities  underwent extensive vascular surgeries in Jan 2020 to improve blood flow, similar surgery for R leg scheduled 12/07/2018. Has a history of left carotid artery stenosis, but not enough to have surgery, has a history of multiple arterial surgeries, history of R cataract surgery, colon surgery, cholecystectomy, R foot skin graft, ovary surgery, gastric bypass, R foot fusion, abdominal hysterectomy; current smoker, plantar fasciitis, peripheral arterial disease, LVH, diabetes mellitus with neuropathy, irregular heartbeat, hiatal hernia, GERD, stenosis of left heart ventricle, depression, lumbar degenerative disc disease, cardiac murmur, asthma, anxiety, obesity, HTN, and seasonal allergies She has been advised not to lift over 10 pounds by physician.      Examination-Activity Limitations  Bathing;Carry;Lift;Sit;Stand;Locomotion Level;Toileting;Dressing;Squat;Transfers;Stairs;Hygiene/Grooming;Other    Examination-Participation Restrictions  Community Activity;Volunteer;Cleaning;Interpersonal Relationship;Yard Work;Other    Rehab Potential  Fair    Clinical Impairments Affecting Rehab Potential  (+) motivated (-) muliple co morbidities, chronic condition    PT Frequency  2x / week    PT Duration  12 weeks    PT Treatment/Interventions  Moist Heat;Patient/family education;Neuromuscular re-education;Therapeutic exercise;Manual techniques;ADLs/Self Care Home Management;Electrical Stimulation;Gait training;Stair training;Functional mobility training;Therapeutic activities;Balance training;Passive range of motion;Dry needling;Energy conservation;Spinal Manipulations;Joint Manipulations;Cryotherapy    PT Next Visit Plan  continue with progressive LE and  functional strengtheing    PT Home Exercise Plan  Medbridge Access Code: [email or web address]    Consulted and Agree with Plan of Care  Patient       Patient will benefit from skilled therapeutic  intervention in order to improve the following deficits and impairments:  Decreased strength, Impaired flexibility, Decreased activity tolerance, Impaired perceived functional ability, Pain, Decreased endurance, Difficulty walking, Abnormal gait, Decreased knowledge of use of DME, Decreased skin integrity, Decreased range of motion, Impaired sensation, Improper body mechanics, Obesity, Postural dysfunction, Increased edema, Decreased mobility, Decreased balance, Cardiopulmonary status limiting activity, Decreased coordination  Visit Diagnosis: 1. Muscle weakness (generalized)   2. Other muscle spasm   3. Chronic bilateral low back pain, unspecified whether sciatica present   4. Difficulty in walking, not elsewhere classified   5. History of falling   6. Chronic bilateral low back pain with right-sided sciatica   7. Chronic bilateral low back pain with sciatica, sciatica laterality unspecified        Problem List Patient Active Problem List   Diagnosis Date Noted  . Lower extremity edema 07/31/2018  . Pressure injury of skin 07/24/2018  . Surgical site infection 07/23/2018  . Wound of left leg 07/23/2018  . Atherosclerosis of artery of extremity with rest pain ([place]) 07/08/2018  . Shortness of  breath 06/02/2018  . Bruit 06/02/2018  . Coronary artery disease of native artery of native heart with stable angina pectoris ([place]) 06/02/2018  . Atherosclerosis of native arteries of extremity with intermittent claudication ([place]) 05/19/2018  . Peripheral arterial disease ([place]) 05/06/2018  . Personal history of colonic polyps   . Aortic stenosis 12/24/2017  . LVH (left ventricular hypertrophy) 12/24/2017  . Hypertension with heart disease 12/24/2017  . Left atrial dilatation 12/12/2017  . Cardiac murmur 12/12/2017  . Gastrojejunal ulcer 12/04/2017  . Hyperphosphatemia 12/04/2017  . Spinal stenosis of lumbar region 06/18/2017  . Degeneration of lumbar intervertebral disc 06/02/2017  .  Assistance needed with transportation 05/26/2017  . Financial difficulties 05/26/2017  . Needs assistance with community resources 05/26/2017  . Moderate recurrent major depression ([place]) 05/26/2017  . Non-healing wound of lower extremity 02/06/2017  . Status post ankle arthrodesis 12/11/2016  . Controlled substance agreement broken 10/11/2016  . Low back pain 09/25/2016  . Osteoarthritis of right subtalar joint 07/11/2016  . Posterior tibial tendinitis of right lower extremity 07/11/2016  . Breast cancer screening 06/18/2016  . Knee pain 04/03/2016  . Hyponatremia 03/01/2016  . High triglycerides 12/24/2015  . Pes planus of both feet 11/16/2015  . Plantar fasciitis of right foot 11/16/2015  . Heel spur 11/16/2015  . Diabetic neuropathy ([place]) 11/16/2015  . Right ankle pain 11/16/2015  . Medication monitoring encounter 11/16/2015  . Chronic pain of multiple joints 08/15/2015  . Abnormal mammogram of right breast 06/19/2015  . Cerumen impaction 03/16/2015  . Hyperlipidemia 12/14/2014  . History of transfusion of packed RBC 12/14/2014  . Hx of smoking 12/14/2014  . Status post bariatric surgery 12/14/2014  . Morbid obesity ([place]) 12/14/2014  . Chronic radicular lumbar pain 12/14/2014  . History of GI bleed 11/12/2014  . GERD without esophagitis 11/12/2014  . History of small bowel obstruction 09/07/2014    Lieutenant Diego PT, DPT 2:25 PM,01/07/19 [id number]  [facility] [address] [place], [address] Phone: [phone]   Fax:  [phone]  Name: JALEEA ALESI MRN: [id number] Date of Birth: 1945-08-25

## 2019-01-08 ENCOUNTER — Telehealth: Payer: Self-pay | Admitting: Cardiovascular Disease

## 2019-01-08 NOTE — Telephone Encounter (Signed)

## 2019-01-09 NOTE — Progress Notes (Signed)
Cardiology Office Note  Date:  01/11/2019   ID:  RHODIE CIENFUEGOS, DOB 10/08/1945, MRN [id number]  PCP:  Fredderick Severance, NP   Chief Complaint  Patient presents with  . Other    Patient denies chest pain and SOB. Meds reviewed verbally with patient.     HPI:  Andrea Holden is a 73 year old woman with past medical history of Diabetes, hemoglobin A1c 6.8 Obesity Hyperlipidemia, noncompliant with Lipitor PAD Smoker 72 years Who presents for follow-up of her aortic valve stenosis, murmur, PAD preoperative evaluation prior to left femoral endarterectomy  Low risk stress test December 2019  Has neuropathy, can only walk to mailbox, Can not walk bound[place] of dog electric fence  Back pain Needs cortisone shots, needs to come off plavix   07/08/2018  Left common femoral and profunda femoris endarterectomies and patch angioplasty  SURGICAL SITE INFECTION  07/23/2018  12/07/2018 Had right side completed Percutaneous transluminal angioplasty of right tibioperoneal trunk and proximal peroneal artery with 3 mm diameter angioplasty balloon Percutaneous transluminal angioplasty of the right SFA and proximal popliteal artery with 5 mm diameter by 30 cm length Lutonix drug-coated angioplasty balloon Viabahn stent   Still smoking 1/2 ppd Previously quit on chantix  Does not walk very far without having to stop secondary to shortness of breath Chronic leg pain  CT scan chest 2012 showing three-vessel coronary disease, moderate to severe  Denies anginal pain  Has a sore right ankle, hardware in place, skin graft  EKG personally reviewed by myself on todays visit Shows normal sinus rhythm with rate 64 bpm no significant ST or T wave changes  Other past medical history reviewed May 27, 2018 had procedure with Dr. Lucky Cowboy Percutaneous transluminal angioplasty of left common femoral artery with 5 mm diameter by 4 cm length Lutonix drug-coated angioplasty balloon  Left lower  extremity nearly occlusive highly calcific stenosis of the left common femoral artery of >90%.  The disease extended into the origins of both the profunda femoris and the superficial femoral artery although the profunda femoris artery disease was mild.  The superficial femoral artery was occluded in the proximal segment.  It reconstituted in the proximal to mid SFA although there was still some disease in the mid to distal SFA of greater than 70%.  The popliteal artery was relatively normal.  Tibioperoneal trunk had about an 80 to 90% stenosis with the peroneal artery distally.       Right lower extremity: There was about an 80 to 90% stenosis in the proximal superficial femoral artery.  The common femoral artery disease was fairly mild.  The mid SFA was occluded and reconstituted at [facility] canal.  Again, there appeared to be stenosis in the tibioperoneal trunk   Lab work reviewed in detail Total cholesterol 183, LDL 116 Now taking Lipitor  CT scan chest August 2012 Images pulled up and reviewed in the office today   PMH:   has a past medical history of Allergy, Anemia (2014), Anxiety, Aortic stenosis (12/24/2017), Arthritis, Asthma, Cardiac murmur (12/12/2017), Cataract, Cataract, Complication of anesthesia, Degenerative disc disease, lumbar, Degenerative disc disease, lumbar, Depression, Diabetes mellitus, Diabetic neuropathy ([place]) (11/16/2015), Dysrhythmia, GERD (gastroesophageal reflux disease), H/O transfusion, History of chicken pox, History of hiatal hernia, History of measles, mumps, or rubella, HOH (hard of hearing), Hyperlipidemia, Hypertension, Irregular heartbeat, LVH (left ventricular hypertrophy) (12/24/2017), Neuropathy, Opiate use (11/16/2015), Peripheral arterial disease ([place]) (05/06/2018), Pes planus of both feet (11/16/2015), Plantar fasciitis of right foot (11/16/2015), and Wheezing.  PSH:    Past Surgical History:  Procedure Laterality Date  . ABDOMINAL HYSTERECTOMY    . APPLICATION  OF WOUND VAC Left 07/23/2018   Procedure: APPLICATION OF WOUND VAC;  Surgeon: Annice Needyew, Jason S, MD;  Location: [facility] ORS;  Service: Vascular;  Laterality: Left;  . banck injections    . CATARACT EXTRACTION W/PHACO Right 05/30/2015   Procedure: CATARACT EXTRACTION PHACO AND INTRAOCULAR LENS PLACEMENT (IOC);  Surgeon: Galen ManilaWilliam Porfilio, MD;  Location: [facility] ORS;  Service: Ophthalmology;  Laterality: Right;  US 00:57 AP% 20.9 CDE 11.99 fluid pack lot #[id number]  . CHOLECYSTECTOMY  1970  . COLON SURGERY  2013   blocked colon  . COLONOSCOPY WITH PROPOFOL N/A 01/20/2018   Procedure: COLONOSCOPY WITH PROPOFOL;  Surgeon: Midge MiniumWohl, Darren, MD;  [facility];  Service: Endoscopy;  Laterality: N/A;  . ENDARTERECTOMY FEMORAL Left 07/08/2018   Procedure: ENDARTERECTOMY FEMORAL;  Surgeon: Annice Needyew, Jason S, MD;  Location: [facility] ORS;  Service: Vascular;  Laterality: Left;  . EYE SURGERY Right    cataract surgery  . FOOT FUSION Right 2018   metal in foot  . GALLBLADDER SURGERY  1970  . GASTRIC BYPASS  2010   lost 178 lbs and regained 40 lbs last few years  . HUMERUS FRACTURE SURGERY Right    metal plate with screws  . internal bleeding  2016   ulcer in past  . LOWER EXTREMITY ANGIOGRAM Left 07/08/2018   Procedure: LOWER EXTREMITY ANGIOGRAM;  Surgeon: Annice Needyew, Jason S, MD;  Location: [facility] ORS;  Service: Vascular;  Laterality: Left;  . LOWER EXTREMITY ANGIOGRAPHY Left 05/27/2018   Procedure: LOWER EXTREMITY ANGIOGRAPHY;  Surgeon: Annice Needyew, Jason S, MD;  Location: [facility] INVASIVE CV LAB;  Service: Cardiovascular;  Laterality: Left;  . LOWER EXTREMITY ANGIOGRAPHY Right 12/07/2018   Procedure: LOWER EXTREMITY ANGIOGRAPHY;  Surgeon: Annice Needyew, Jason S, MD;  Location: [facility] INVASIVE CV LAB;  Service: Cardiovascular;  Laterality: Right;  . MOUTH SURGERY     root canals and crowns and extractions  . OVARY SURGERY    . SKIN GRAFT Right 2018   RT foot. foot has been rebuilt.  it is full of metal  . TENOTOMY ACHILLES TENDON Right     Percuntaneous. metal in foot  . TONSILLECTOMY    . WOUND DEBRIDEMENT Left 07/23/2018   Procedure: DEBRIDEMENT WOUND;  Surgeon: Annice Needyew, Jason S, MD;  Location: [facility] ORS;  Service: Vascular;  Laterality: Left;    Current Outpatient Medications  Medication Sig Dispense Refill  . acetaminophen (TYLENOL) 325 MG tablet Take 325 mg by mouth every 6 (six) hours as needed.     Marland Kitchen. albuterol (PROVENTIL HFA;VENTOLIN HFA) 108 (90 Base) MCG/ACT inhaler Inhale 2 puffs into the lungs every 6 (six) hours as needed for wheezing or shortness of breath. 1 Inhaler 1  . atorvastatin (LIPITOR) 10 MG tablet Take 1 tablet (10 mg total) by mouth at bedtime. 90 tablet 1  . CHANTIX 1 MG tablet Take 1 tablet (1 mg total) by mouth 2 (two) times daily. 60 tablet 2  . clopidogrel (PLAVIX) 75 MG tablet TAKE 1 TABLET BY MOUTH ONCE DAILY 90 tablet 0  . co-enzyme Q-10 30 MG capsule Take 100 mg by mouth daily.     . fluticasone (FLONASE) 50 MCG/ACT nasal spray Place 2 sprays into both nostrils daily as needed. 16 g 4  . gabapentin (NEURONTIN) 400 MG capsule Take 1 capsule (400 mg total) by mouth 2 (two) times daily. 180 capsule 1  . Glucosamine-Chondroit-Vit C-Mn (GLUCOSAMINE  1500 COMPLEX) CAPS Take 1 capsule by mouth daily. 90 capsule 1  . HYDROcodone-acetaminophen (NORCO) 5-325 MG tablet Take 0.5 tablets by mouth every 6 (six) hours as needed for moderate pain. 30 tablet 0  . lisinopril (ZESTRIL) 10 MG tablet Take 1 tablet (10 mg total) by mouth daily. 90 tablet 1  . Melatonin 5 MG TABS Take 1 tablet by mouth at bedtime.     . Multiple Vitamins-Minerals (MULTIVITAMIN ADULTS 50+) TABS Take 1 tablet by mouth daily. 90 tablet 1  . pantoprazole (PROTONIX) 20 MG tablet TAKE 1 TABLET BY MOUTH ONCE DAILY 30 tablet 0  . sertraline (ZOLOFT) 100 MG tablet Take 1.5 tablets (150 mg total) by mouth daily. 135 tablet 1  . vitamin C (ASCORBIC ACID) 500 MG tablet Take 500 mg by mouth daily.      No current facility-administered medications for  this visit.      Allergies:   Meloxicam, Nsaids, and Sitagliptin   Social History:  The patient  reports that she has been smoking cigarettes. She started smoking about 58 years ago. She has a 27.50 pack-year smoking history. She has never used smokeless tobacco. She reports current alcohol use. She reports previous drug use. Drug: Marijuana.   Family History:   family history includes Arthritis in her brother and father; Asthma in her mother; COPD in her mother; Colitis in her daughter; Depression in her brother and father; Diabetes in her brother and maternal grandmother; Heart attack in her brother and father; Heart disease in her brother and father; Hyperlipidemia in her brother; Hypertension in her brother and father; Stroke in her brother and father; Thyroid disease in her daughter; Vision loss in her brother.    Review of Systems: Review of Systems  Constitutional: Negative.   Respiratory: Positive for shortness of breath.   Cardiovascular: Negative.   Gastrointestinal: Negative.   Musculoskeletal: Negative.        Bilateral leg pain  Neurological: Negative.   Psychiatric/Behavioral: Negative.   All other systems reviewed and are negative.    PHYSICAL EXAM: VS:  BP 128/60 (BP Location: Right Arm, Patient Position: Sitting, Cuff Size: Normal)   Pulse 64   Ht 5\' 1"  (1.549 m)   Wt 203 lb (92.1 kg)   BMI 38.36 kg/m  , BMI Body mass index is 38.36 kg/m. GEN: Well nourished, well developed, in no acute distress in a wheelchair HEENT: normal  Neck: no JVD, carotid bruits, or masses Cardiac: RRR; no murmurs, rubs, or gallops,trace ankle edema  Unable to appreciate lower extremity pulses Respiratory:  clear to auscultation bilaterally, normal work of breathing GI: soft, nontender, nondistended, + BS MS: no deformity or atrophy  Skin: warm and dry, no rash Neuro:  Strength and sensation are intact Psych: euthymic mood, full affect   Recent Labs: 07/24/2018: Hemoglobin 10.1;  Magnesium 1.9; Platelets 289 12/18/2018: ALT 7; BUN 18; Creat 0.97; Potassium 4.9; Sodium 139    Lipid Panel Lab Results  Component Value Date   CHOL 135 12/18/2018   HDL 41 (L) 12/18/2018   LDLCALC 70 12/18/2018   TRIG 161 (H) 12/18/2018      Wt Readings from Last 3 Encounters:  01/11/19 203 lb (92.1 kg)  01/05/19 207 lb (93.9 kg)  01/04/19 207 lb (93.9 kg)      ASSESSMENT AND PLAN:  Peripheral arterial disease (HCC)  Severe lower extremity arterial disease Had procedure b/l, claudication better  Diabetic polyneuropathy associated with type 2 diabetes mellitus (HCC) HBA1C 6.9, not  moving at home We have encouraged continued exercise, careful diet management in an effort to lose weight.  COPD, mild (HCC) Long smoking history Currently smoker 1/2 ppd We have encouraged her to continue to work on weaning her cigarettes and smoking cessation. She will continue to work on this and does not want any assistance with chantix.   Mixed hyperlipidemia -  Stressed the importance of taking her Lipitor Goal LDL 60 or less Numbers better , LDL 70  Hx of smoking Reports that she stopped smoking several years ago  55-year smoking history  Back on chantix  Bruit - Carotid u/s  Less than 39% stenosis b/l  In 2019  Coronary artery disease of native artery of native heart with stable angina pectoris (HCC) Images from CT scan showing coronary disease, negative stress test  Aortic valve disease Normal cardiac function  Mild aortic valve stenosis in 2019  Disposition:   F/U  12 months   Total encounter time more than 25 minutes  Greater than 50% was spent in counseling and coordination of care with the patient    Orders Placed This Encounter  Procedures  . EKG 12-Lead     Signed, Dossie Arbourim Christiann Hagerty, M.D., Ph.D. 01/11/2019  [facility], [place] [phone]

## 2019-01-11 ENCOUNTER — Other Ambulatory Visit: Payer: Self-pay

## 2019-01-11 ENCOUNTER — Ambulatory Visit: Payer: Self-pay

## 2019-01-11 ENCOUNTER — Ambulatory Visit (INDEPENDENT_AMBULATORY_CARE_PROVIDER_SITE_OTHER): Payer: Medicare Other | Admitting: Cardiovascular Disease

## 2019-01-11 VITALS — BP 128/60 | HR 64 | Ht 61.0 in | Wt 203.0 lb

## 2019-01-11 DIAGNOSIS — J449 Chronic obstructive pulmonary disease, unspecified: Secondary | ICD-10-CM | POA: Diagnosis not present

## 2019-01-11 DIAGNOSIS — I739 Peripheral vascular disease, unspecified: Secondary | ICD-10-CM

## 2019-01-11 DIAGNOSIS — I70219 Atherosclerosis of native arteries of extremities with intermittent claudication, unspecified extremity: Secondary | ICD-10-CM | POA: Diagnosis not present

## 2019-01-11 DIAGNOSIS — I25118 Atherosclerotic heart disease of native coronary artery with other forms of angina pectoris: Secondary | ICD-10-CM | POA: Diagnosis not present

## 2019-01-11 DIAGNOSIS — E1142 Type 2 diabetes mellitus with diabetic polyneuropathy: Secondary | ICD-10-CM | POA: Diagnosis not present

## 2019-01-11 DIAGNOSIS — Z87891 Personal history of nicotine dependence: Secondary | ICD-10-CM

## 2019-01-11 DIAGNOSIS — E782 Mixed hyperlipidemia: Secondary | ICD-10-CM

## 2019-01-11 MED ORDER — VARENICLINE TARTRATE 1 MG PO TABS: 1.0000 mg | ORAL_TABLET | Freq: Two times a day (BID) | ORAL | 2 refills | Status: DC

## 2019-01-11 MED ORDER — ATORVASTATIN CALCIUM 10 MG PO TABS: 10.0000 mg | ORAL_TABLET | Freq: Every day | ORAL | 3 refills | Status: DC

## 2019-01-11 MED ORDER — CLOPIDOGREL BISULFATE 75 MG PO TABS: 75.0000 mg | ORAL_TABLET | Freq: Every day | ORAL | 3 refills | Status: DC

## 2019-01-11 NOTE — Chronic Care Management (AMB) (Signed)
   Chronic Care Management   Unsuccessful Call Note 01/11/2019 Name: Andrea Holden MRN: [id number] DOB: 1945/08/06  Jaysie J Bromaghimis a 73 y.o.year old [place], NPfor primary care. [facility] Transitions of Care RNasked the CCM team to consult the patient for assistance with chronic disease management related to multiple chronic diseases. Referral was placed1/13/19. Today, RN CM attempted to follow up with [name] to discuss health goals as she was lost to follow up but expressed interest in re-engagement to [name], LPN during her recent Medicare Annual Wellness Exam.  Was unable to reach patient via telephone today for follow up. I have left HIPAA compliant voicemail asking patient to return my call. (unsuccessful outreach #1).   Plan: Will follow-up within 7 business days via telephone.       Khalifa Knecht E. Rollene Rotunda, RN, BSN Nurse Care Coordinator [facility] Care Management  [phone]

## 2019-01-11 NOTE — Patient Instructions (Signed)
Medication Instructions:  Refill chantix   If you need a refill on your cardiac medications before your next appointment, please call your pharmacy.    Lab work: No new labs needed   If you have labs (blood work) drawn today and your tests are completely normal, you will receive your results only by: Marland Kitchen MyChart Message (if you have MyChart) OR . A paper copy in the mail If you have any lab test that is abnormal or we need to change your treatment, we will call you to review the results.   Testing/Procedures: No new testing needed   Follow-Up: At [facility], you and your health needs are our priority.  As part of our continuing mission to provide you with exceptional heart care, we have created designated Provider Care Teams.  These Care Teams include your primary Cardiologist (physician) and Advanced Practice Providers (APPs -  Physician Assistants and Nurse Practitioners) who all work together to provide you with the care you need, when you need it.  . You will need a follow up appointment in 12 months .   Please call our office 2 months in advance to schedule this appointment.    . Providers on your designated Care Team:   . Murray Hodgkins, NP . Christell Faith, PA-C . Marrianne Mood, PA-C  Any Other Special Instructions Will Be Listed Below (If Applicable).  For educational health videos Log in to : [email or web address] Or : [email or web address], password : [email or web address]

## 2019-01-12 ENCOUNTER — Ambulatory Visit: Payer: Medicare Other | Admitting: Physical Therapy

## 2019-01-12 ENCOUNTER — Encounter: Payer: Self-pay | Admitting: Physical Therapy

## 2019-01-12 VITALS — BP 138/78

## 2019-01-12 DIAGNOSIS — R262 Difficulty in walking, not elsewhere classified: Secondary | ICD-10-CM | POA: Diagnosis not present

## 2019-01-12 DIAGNOSIS — G8929 Other chronic pain: Secondary | ICD-10-CM

## 2019-01-12 DIAGNOSIS — Z9181 History of falling: Secondary | ICD-10-CM

## 2019-01-12 DIAGNOSIS — M545 Low back pain: Secondary | ICD-10-CM | POA: Diagnosis not present

## 2019-01-12 DIAGNOSIS — M6281 Muscle weakness (generalized): Secondary | ICD-10-CM | POA: Diagnosis not present

## 2019-01-12 DIAGNOSIS — M62838 Other muscle spasm: Secondary | ICD-10-CM | POA: Diagnosis not present

## 2019-01-12 DIAGNOSIS — M5440 Lumbago with sciatica, unspecified side: Secondary | ICD-10-CM | POA: Diagnosis not present

## 2019-01-12 DIAGNOSIS — M5441 Lumbago with sciatica, right side: Secondary | ICD-10-CM | POA: Diagnosis not present

## 2019-01-12 NOTE — Therapy (Signed)
[facility] [address], [place], [address] Phone: [phone]   Fax:  [phone]  Physical Therapy Treatment  Patient Details  Name: Andrea Holden MRN: [id number] Date of Birth: [birth year] Referring Provider (PT): Fredderick Severance, NP    Encounter Date: 01/12/2019  PT End of Session - 01/12/19 1938    Visit Number  13    Number of Visits  24    Date for PT Re-Evaluation  02/09/19    Authorization Type  [facility] Medicare reporting period from 12/15/2018    Authorization Time Period  Current cert period 6/33/3545 - 02/09/2019 (latest PN: 12/15/2018)    Authorization - Visit Number  6    Authorization - Number of Visits  10    PT Start Time  1350   5 min late   PT Stop Time  1430    PT Time Calculation (min)  40 min    Activity Tolerance  Patient tolerated treatment well;Patient limited by fatigue;Patient limited by pain    Behavior During Therapy  [facility] for tasks assessed/performed       Past Medical History:  Diagnosis Date  . Allergy    seasonal allergies  . Anemia 2014   needed 5 units of blood d/t passing out, weak  . Anxiety   . Aortic stenosis 12/24/2017   Echo Aug 2018  . Arthritis   . Asthma    allergy induced asthma  . Cardiac murmur 12/12/2017  . Cataract   . Cataract    left  . Complication of anesthesia    arrhythmia following colonoscopy  . Degenerative disc disease, lumbar   . Degenerative disc disease, lumbar   . Depression   . Diabetes mellitus   . Diabetic neuropathy ([place]) 11/16/2015  . Dysrhythmia    stenosis of left ventricle  . GERD (gastroesophageal reflux disease)   . H/O transfusion    patient was given 5 units of blood while at [place], blood type O+  . History of chicken pox   . History of hiatal hernia   . History of measles, mumps, or rubella   . HOH (hard of hearing)    does not use hearing aides yet  . Hyperlipidemia   . Hypertension   . Irregular  heartbeat   . LVH (left ventricular hypertrophy) 12/24/2017   Echo Aug 2018  . Neuropathy   . Opiate use 11/16/2015  . Peripheral arterial disease ([place]) 05/06/2018   At rest, left > right; refer to vasc  . Pes planus of both feet 11/16/2015  . Plantar fasciitis of right foot 11/16/2015  . Wheezing     Past Surgical History:  Procedure Laterality Date  . ABDOMINAL HYSTERECTOMY    . APPLICATION OF WOUND VAC Left 07/23/2018   Procedure: APPLICATION OF WOUND VAC;  Surgeon: Algernon Huxley, MD;  Location: [facility] ORS;  Service: Vascular;  Laterality: Left;  . banck injections    . CATARACT EXTRACTION W/PHACO Right 05/30/2015   Procedure: CATARACT EXTRACTION PHACO AND INTRAOCULAR LENS PLACEMENT (IOC);  Surgeon: Birder Robson, MD;  Location: [facility] OR[facility];  Service: Ophthalmology;  Laterality: Right;  [place] 00:57 AP% 20.9 CDE 11.99 fluid pack lot #[id number]  . CHOLECYSTECTOMY  1970  . COLON SURGERY  2013   blocked colon  . COLONOSCOPY WITH PROPOFOL N/A 01/20/2018   Procedure: COLONOSCOPY WITH PROPOFOL;  Surgeon: Lucilla Lame, MD;  Location: [facility] ENDOSCOPY;  Service: Endoscopy;  Laterality: N/A;  . ENDARTERECTOMY  FEMORAL Left 07/08/2018   Procedure: ENDARTERECTOMY FEMORAL;  Surgeon: Algernon Huxley, MD;  Location: [facility] ORS;  Service: Vascular;  Laterality: Left;  . EYE SURGERY Right    cataract surgery  . FOOT FUSION Right 2018   metal in foot  . GALLBLADDER SURGERY  1970  . GASTRIC BYPASS  2010   lost 178 lbs and regained 40 lbs last few years  . HUMERUS FRACTURE SURGERY Right    metal plate with screws  . internal bleeding  2016   ulcer in past  . LOWER EXTREMITY ANGIOGRAM Left 07/08/2018   Procedure: LOWER EXTREMITY ANGIOGRAM;  Surgeon: Algernon Huxley, MD;  Location: [facility] OR[facility];  Service: Vascular;  Laterality: Left;  . LOWER EXTREMITY ANGIOGRAPHY Left 05/27/2018   Procedure: LOWER EXTREMITY ANGIOGRAPHY;  Surgeon: Algernon Huxley, MD;  Location: [place] [facility];  Service: Cardiovascular;  Laterality:  Left;  . LOWER EXTREMITY ANGIOGRAPHY Right 12/07/2018   Procedure: LOWER EXTREMITY ANGIOGRAPHY;  Surgeon: Algernon Huxley, MD;  Location: [place] [facility];  Service: Cardiovascular;  Laterality: Right;  . MOUTH SURGERY     root canals and crowns and extractions  . OVARY SURGERY    . SKIN GRAFT Right 2018   RT foot. foot has been rebuilt.  it is full of metal  . TENOTOMY ACHILLES TENDON Right    Percuntaneous. metal in foot  . TONSILLECTOMY    . WOUND DEBRIDEMENT Left 07/23/2018   Procedure: DEBRIDEMENT WOUND;  Surgeon: Algernon Huxley, MD;  Location: [facility] OR[facility];  Service: Vascular;  Laterality: Left;    Vitals:   01/12/19 1352  BP: 138/78    Subjective Assessment - 01/12/19 1351    Subjective  Pateint reports she is feeling well. She saw her doctor recently who said her EKG is excellent. She has 4/10 pain in her lumbar spine. She has injections coming up on January 27, 2019. She states she was not as sore after last session.    Pertinent History  Patient is a 73 y.o. female who presents to outpatient physical therapy with a referral for medical diagnosis of degeneration of lumbar intervertebral disc. This patient's chief complaints consist of chronic low back pain and foot pain, weakness, stiffness, leading to the following functional deficits: difficulty with with basic ADLs, IADLs, ambulation. She used to get injections to help control back pain but it has recently worsened due to being unable to get injections during [place] pandemic. The patient has been informed of current processes in place at [facility] to protect patients from Covid-19 exposure including social distancing, schedule modifications, and new cleaning procedures. After discussing their particular risk with a therapist based on the patient's personal risk factors, the patient has decided to proceed with in-person therapy. Pt is scheduled for R LE angiography on 12/07/2018. She has recently started smoking again with the stress of  COVID19 pandemic and feels strongly she needs to stop. Surgeon has stated in the past he will not complete surgery if she is smoking.  Relevant past medical history and comorbidities include underwent extensive vascular surgeries in Jan 2020 to improve blood flow, similar surgery for R leg scheduled 12/07/2018. Has a history of left carotid artery stenosis, but not enough to have surgery, has a history of multiple arterial surgeries, history of R cataract surgery, colon surgery, cholecystectomy, R foot skin graft, ovary surgery, gastric bypass, R foot fusion, abdominal hysterectomy; current smoker, plantar fasciitis, peripheral arterial disease, LVH, diabetes mellitus with neuropathy, irregular heartbeat, hiatal  hernia, GERD, stenosis of left heart ventricle, depression, lumbar degenerative disc disease, cardiac murmur, asthma, anxiety, obesity, HTN, and seasonal allergies She has been advised not to lift over 10 pounds by physician    Limitations  Sitting;Standing;Walking;House hold activities;Lifting    How long can you sit comfortably?  <1 hour    How long can you stand comfortably?  1 minute    How long can you walk comfortably?  2 minutes, depends on what she has with her. Able to walk at fair with rollator. To mailbox 120 feet causes shaking in R leg.      Diagnostic tests  MRI lumbar spine report 06/14/2017: "IMPRESSION: 1. Stable degenerative of lumbar spondylosis and scoliosis with multilevel disc disease and facet disease. 2. Stable right foraminal stenosis at L5-S1 due to right-sided disc    Currently in Pain?  Yes    Pain Score  4     Pain Location  Back    Pain Orientation  Lower    Pain Descriptors / Indicators  Sharp    Pain Type  Chronic pain       TREATMENT:  Therapeutic exercise:to centralize symptoms and improve ROM, strength, muscular endurance, and activity tolerance required for successful completion of functional activities. - blood pressure measurementmanually measured at  the wrist toensure safety of exercise. Determined safe for exercise (see above for values). -NuStep level2using bilateral upper and lower extremities. Seatsetting 8/handle setting7. For improved extremity mobility, muscular endurance, and activity tolerance; and to induce the analgesic effect of aerobic exercise, stimulate improved joint nutrition, and prepare body structures and systems for following interventions. x73mnuteswith SPM above 60during subjective exam. - standing alternating hip hikes progressing to marches with hip hikes with BUE support. Tactile cuing and AAROM to assist left hip abductors in lifting R hip. Patient able to prevent R hip from dropping by end of practice period. Approx 30 reps total. Pt attempted to use R hand to feel hip motion on the R but did not feel safe enough to stand and complete exercise with single UE support.  - autoelongation seated on theraball with two poles positioned at sides pushing into floor with arms in 90/90 position with poles held at elbows. Practicing pressing down with lats and shoulders thorough arms and poles while breathing and focusing on lengthening spine towards celing to improve posture and spinal support muscles. Extensive tactile cuing from clinician and mirror feedback to help patient self-correct and become more aware. Practiced for about 10 minutes in sitting until patient was fatigued and complaining of increased L lower back pain. Required min A to transfer to/from theraball and maintain balance on ball. Had difficulty moving feet without falling, demonstrating poor core strength and coordination.  - repeated autoelongation exercise in standing to patient fatigue. Patient more stable in standing and demo improved awareness and control of shoulders rising with each breath.  - repeated autoelongation seated on edge of plinth to patient fatigue. Futher demonstrating improved awareness. Moved arms from poles to fingers on shoulders with  shoulders abducted to 90 degrees and thinking of pulling out towards each side of room to set upper trunk stability and give patient a way to practice breathing at home without poles. Patient able to maintain acceptably therapeutic position with great concentration and maximal decreasing to moderate cuing.  -Education on HEP including spending 5 min per day practicing improved posture during breathing.    HOME EXERCISE PROGRAM Access Code: [email or web address] URL: [email or web address]  Date: 12/03/2018  Prepared by:  Rosita Kea   Exercises   Sit to Stand without Arm Support - 30 reps - 2x daily   Standing Hip Hiking - 3 sets - 10 reps - 2x daily   Supine Bridge with Resistance Band - 3 sets - 10 reps - 1 second hold - 2x daily   Sidelying Hip Abduction - 3 sets - 10 reps - 2x daily   Prone Hip Extension with Bent Knee - 3 sets - 10 reps - 2x daily   Prone Hip Abduction on Slider - 3 sets - 10 reps - 2x daily  Breathing exercise x 5 min    PT Education - 01/12/19 1938    Education provided  Yes    Education Details  Exercise purpose/form. Self management techniques. Education on diagnosis, prognosis, POC, anatomy and physiology of current condition    Person(s) Educated  Patient    Methods  Explanation;Demonstration;Tactile cues;Verbal cues    Comprehension  Verbalized understanding;Returned demonstration;Verbal cues required;Tactile cues required       PT Short Term Goals - 01/05/19 1738      PT SHORT TERM GOAL #1   Title  Be independent with initial home exercise program for self-management of symptoms.    Baseline  Initial HEP provided at [facility] (11/17/2018); was completing until vascular procedure, re-instated 12/15/2018 (12/15/2018);    Time  2    Period  Weeks    Status  Partially Met    Target Date  01/19/19        PT Long Term Goals - 12/17/18 1422      PT LONG TERM GOAL #1   Title  Be independent with a long-term home exercise program for  self-management of symptoms.     Baseline  Initial HEP provided at [facility] (11/17/2018); was doing better before vascular surgery, is working at returning to [facility] since procedure (12/15/2018).    Time  12    Period  Weeks    Status  Partially Met    Target Date  02/09/19      PT LONG TERM GOAL #2   Title  Patient will improve 6MWT to equal or greater than 1000 feet with LRAD to demonstrate improved activity tolerance for community ambulation.     Baseline  6 Minute Walk Test: 200 feet with SPC and supervision. Stopped at 1:43 due to fatigue and leg pain. (11/17/2018); 457 feet SPC, on seated and one standing break (12/15/2018);    Time  12    Period  Weeks    Status  Partially Met    Target Date  02/09/19      PT LONG TERM GOAL #3   Title  Patient will demonstrate improved 5 Times Sit to Stand test to 11 seconds from chair height with no UE support to demonstrate improvement in LE power and functional strength for daily activities.    Baseline  5 Time Sit to Stand: 27 seconds from chair height no UE support (11/17/2018); 18 seconds, no UE support from chair-high plinth (12/15/2018);    Time  12    Period  Weeks    Status  Partially Met    Target Date  02/09/19      PT LONG TERM GOAL #4   Title  Patient will ambulate faster than 1.47ms on the 10 Meter Walk Test to improve ability to cross street safey for community participation.     Baseline  testing deferred to next session (11/17/2018); 1.37 m/sec with SPC (11/24/2018); 1.3 m/sec with  SPC (12/15/2018);    Time  12    Period  Weeks    Status  Achieved    Target Date  02/09/19      PT LONG TERM GOAL #5   Title  Patient will score equal or greater than  25/30 on the Functional Gait Assessment to demonstrate low fall risk to improve her ability to participate safely in community activities.     Baseline  testing deferred to next session (11/17/2018); 11/30 high fall risk (11/24/2018); unable to measure due to time limitation (12/15/2018); 13/30 high  fall risk (12/17/2018);    Time  12    Period  Weeks    Status  Partially Met    Target Date  02/09/19            Plan - 01/12/19 1954    Clinical Impression Statement  Patient tolerated treatment well today. Patient continues to struggle with left hip abduction strength and stability for ambulation. Today's treatment focused on improved posture control and endurance. Patient demonstrates scoliotic posture with R thoracic convexity and L lumbar convexity that contributes to or results in an apparent leg length discrepency with R leg shorter and R lateral shift of the spine that impacts patient's function and likely affects pain levels. Autoelongation techniques were introduced and patient was able to tolerate well with some report of increased L spine discomfort towards end of each set when she was fatigued. She reported feeling worked out by end of session and that her pain was slightly higher. She accepted that this is a usual response to using and stretching muscles in this region. Patient was able to moderately improve posture during autoelongation exercise with intensive cuing, most notably reducing raising of shoulders with deep breath and decreased spinal collapse with exhalation. Overall the patient would benefit from further skilled PT intervention to return to progression of program, pain management and for preparation of self management of condition.    Personal Factors and Comorbidities  Age;Comorbidity 3+;Fitness;Time since onset of injury/illness/exacerbation;Past/Current Experience;Other    Comorbidities  underwent extensive vascular surgeries in Jan 2020 to improve blood flow, similar surgery for R leg scheduled 12/07/2018. Has a history of left carotid artery stenosis, but not enough to have surgery, has a history of multiple arterial surgeries, history of R cataract surgery, colon surgery, cholecystectomy, R foot skin graft, ovary surgery, gastric bypass, R foot fusion, abdominal  hysterectomy; current smoker, plantar fasciitis, peripheral arterial disease, LVH, diabetes mellitus with neuropathy, irregular heartbeat, hiatal hernia, GERD, stenosis of left heart ventricle, depression, lumbar degenerative disc disease, cardiac murmur, asthma, anxiety, obesity, HTN, and seasonal allergies She has been advised not to lift over 10 pounds by physician.      Examination-Activity Limitations  Bathing;Carry;Lift;Sit;Stand;Locomotion Level;Toileting;Dressing;Squat;Transfers;Stairs;Hygiene/Grooming;Other    Examination-Participation Restrictions  Community Activity;Volunteer;Cleaning;Interpersonal Relationship;Yard Work;Other    Rehab Potential  Fair    Clinical Impairments Affecting Rehab Potential  (+) motivated (-) muliple co morbidities, chronic condition    PT Frequency  2x / week    PT Duration  12 weeks    PT Treatment/Interventions  Moist Heat;Patient/family education;Neuromuscular re-education;Therapeutic exercise;Manual techniques;ADLs/Self Care Home Management;Electrical Stimulation;Gait training;Stair training;Functional mobility training;Therapeutic activities;Balance training;Passive range of motion;Dry needling;Energy conservation;Spinal Manipulations;Joint Manipulations;Cryotherapy    PT Next Visit Plan  continue with progressive LE and  functional strengtheing; incorporate postural strengthening    PT Home Exercise Plan  Medbridge Access Code: [email or web address]    Consulted and Agree with Plan of Care  Patient  Patient will benefit from skilled therapeutic intervention in order to improve the following deficits and impairments:  Decreased strength, Impaired flexibility, Decreased activity tolerance, Impaired perceived functional ability, Pain, Decreased endurance, Difficulty walking, Abnormal gait, Decreased knowledge of use of DME, Decreased skin integrity, Decreased range of motion, Impaired sensation, Improper body mechanics, Obesity, Postural dysfunction, Increased edema,  Decreased mobility, Decreased balance, Cardiopulmonary status limiting activity, Decreased coordination  Visit Diagnosis: 1. Muscle weakness (generalized)   2. Chronic bilateral low back pain, unspecified whether sciatica present   3. Difficulty in walking, not elsewhere classified   4. History of falling        Problem List Patient Active Problem List   Diagnosis Date Noted  . Lower extremity edema 07/31/2018  . Pressure injury of skin 07/24/2018  . Surgical site infection 07/23/2018  . Wound of left leg 07/23/2018  . Atherosclerosis of artery of extremity with rest pain ([place]) 07/08/2018  . Shortness of breath 06/02/2018  . Bruit 06/02/2018  . Coronary artery disease of native artery of native heart with stable angina pectoris ([place]) 06/02/2018  . Atherosclerosis of native arteries of extremity with intermittent claudication ([place]) 05/19/2018  . Peripheral arterial disease ([place]) 05/06/2018  . Personal history of colonic polyps   . Aortic stenosis 12/24/2017  . LVH (left ventricular hypertrophy) 12/24/2017  . Hypertension with heart disease 12/24/2017  . Left atrial dilatation 12/12/2017  . Cardiac murmur 12/12/2017  . Gastrojejunal ulcer 12/04/2017  . Hyperphosphatemia 12/04/2017  . Spinal stenosis of lumbar region 06/18/2017  . Degeneration of lumbar intervertebral disc 06/02/2017  . Assistance needed with transportation 05/26/2017  . Financial difficulties 05/26/2017  . Needs assistance with community resources 05/26/2017  . Moderate recurrent major depression ([place]) 05/26/2017  . Non-healing wound of lower extremity 02/06/2017  . Status post ankle arthrodesis 12/11/2016  . Controlled substance agreement broken 10/11/2016  . Low back pain 09/25/2016  . Osteoarthritis of right subtalar joint 07/11/2016  . Posterior tibial tendinitis of right lower extremity 07/11/2016  . Breast cancer screening 06/18/2016  . Knee pain 04/03/2016  . Hyponatremia 03/01/2016  . High  triglycerides 12/24/2015  . Pes planus of both feet 11/16/2015  . Plantar fasciitis of right foot 11/16/2015  . Heel spur 11/16/2015  . Diabetic neuropathy ([place]) 11/16/2015  . Right ankle pain 11/16/2015  . Medication monitoring encounter 11/16/2015  . Chronic pain of multiple joints 08/15/2015  . Abnormal mammogram of right breast 06/19/2015  . Cerumen impaction 03/16/2015  . Hyperlipidemia 12/14/2014  . History of transfusion of packed RBC 12/14/2014  . Hx of smoking 12/14/2014  . Status post bariatric surgery 12/14/2014  . Morbid obesity ([place]) 12/14/2014  . Chronic radicular lumbar pain 12/14/2014  . History of GI bleed 11/12/2014  . GERD without esophagitis 11/12/2014  . History of small bowel obstruction 09/07/2014    Everlean Alstrom. [name], PT, DPT 01/12/19, 7:55 PM  [facility] [address] [place], [address] Phone: [phone]   Fax:  [phone]  Name: Andrea Holden MRN: [id number] Date of Birth: March 15, 1946

## 2019-01-14 ENCOUNTER — Ambulatory Visit: Payer: Medicare Other | Admitting: Physical Therapy

## 2019-01-14 ENCOUNTER — Other Ambulatory Visit: Payer: Self-pay

## 2019-01-14 VITALS — BP 130/77

## 2019-01-14 DIAGNOSIS — M545 Low back pain: Secondary | ICD-10-CM | POA: Diagnosis not present

## 2019-01-14 DIAGNOSIS — Z9181 History of falling: Secondary | ICD-10-CM

## 2019-01-14 DIAGNOSIS — G8929 Other chronic pain: Secondary | ICD-10-CM | POA: Diagnosis not present

## 2019-01-14 DIAGNOSIS — M62838 Other muscle spasm: Secondary | ICD-10-CM | POA: Diagnosis not present

## 2019-01-14 DIAGNOSIS — M6281 Muscle weakness (generalized): Secondary | ICD-10-CM | POA: Diagnosis not present

## 2019-01-14 DIAGNOSIS — R262 Difficulty in walking, not elsewhere classified: Secondary | ICD-10-CM

## 2019-01-14 DIAGNOSIS — M5440 Lumbago with sciatica, unspecified side: Secondary | ICD-10-CM | POA: Diagnosis not present

## 2019-01-14 DIAGNOSIS — M5441 Lumbago with sciatica, right side: Secondary | ICD-10-CM | POA: Diagnosis not present

## 2019-01-14 NOTE — Therapy (Signed)
[facility] [address] [place], [address] Phone: [phone]   Fax:  [phone]  Physical Therapy Treatment  Patient Details  Name: Andrea Holden MRN: [id number] Date of Birth: 1946/04/20 Referring Provider (PT): Fredderick Severance, NP    Encounter Date: 01/14/2019  PT End of Session - 01/15/19 0824    Visit Number  14    Number of Visits  24    Date for PT Re-Evaluation  02/09/19    Authorization Type  [facility] Medicare reporting period from 12/15/2018    Authorization Time Period  Current cert period 9/39/0300 - 02/09/2019 (latest PN: 12/15/2018)    Authorization - Visit Number  7    Authorization - Number of Visits  10    PT Start Time  1345    PT Stop Time  1425    PT Time Calculation (min)  40 min    Activity Tolerance  Patient tolerated treatment well;Patient limited by fatigue    Behavior During Therapy  [facility] for tasks assessed/performed       Past Medical History:  Diagnosis Date  . Allergy    seasonal allergies  . Anemia 2014   needed 5 units of blood d/t passing out, weak  . Anxiety   . Aortic stenosis 12/24/2017   Echo Aug 2018  . Arthritis   . Asthma    allergy induced asthma  . Cardiac murmur 12/12/2017  . Cataract   . Cataract    left  . Complication of anesthesia    arrhythmia following colonoscopy  . Degenerative disc disease, lumbar   . Degenerative disc disease, lumbar   . Depression   . Diabetes mellitus   . Diabetic neuropathy ([place]) 11/16/2015  . Dysrhythmia    stenosis of left ventricle  . GERD (gastroesophageal reflux disease)   . H/O transfusion    patient was given 5 units of blood while at [place], blood type O+  . History of chicken pox   . History of hiatal hernia   . History of measles, mumps, or rubella   . HOH (hard of hearing)    does not use hearing aides yet  . Hyperlipidemia   . Hypertension   . Irregular heartbeat   . LVH (left ventricular  hypertrophy) 12/24/2017   Echo Aug 2018  . Neuropathy   . Opiate use 11/16/2015  . Peripheral arterial disease ([place]) 05/06/2018   At rest, left > right; refer to vasc  . Pes planus of both feet 11/16/2015  . Plantar fasciitis of right foot 11/16/2015  . Wheezing     Past Surgical History:  Procedure Laterality Date  . ABDOMINAL HYSTERECTOMY    . APPLICATION OF WOUND VAC Left 07/23/2018   Procedure: APPLICATION OF WOUND VAC;  Surgeon: Algernon Huxley, MD;  Location: [facility] ORS;  Service: Vascular;  Laterality: Left;  . banck injections    . CATARACT EXTRACTION W/PHACO Right 05/30/2015   Procedure: CATARACT EXTRACTION PHACO AND INTRAOCULAR LENS PLACEMENT (IOC);  Surgeon: Birder Robson, MD;  Location: [facility] ORS;  Service: Ophthalmology;  Laterality: Right;  [place] 00:57 AP% 20.9 CDE 11.99 fluid pack lot #[id number]  . CHOLECYSTECTOMY  1970  . COLON SURGERY  2013   blocked colon  . COLONOSCOPY WITH PROPOFOL N/A 01/20/2018   Procedure: COLONOSCOPY WITH PROPOFOL;  Surgeon: Lucilla Lame, MD;  Location: [facility] ENDOSCOPY;  Service: Endoscopy;  Laterality: N/A;  . ENDARTERECTOMY FEMORAL Left 07/08/2018   Procedure: ENDARTERECTOMY  FEMORAL;  Surgeon: Algernon Huxley, MD;  Location: [facility] ORS;  Service: Vascular;  Laterality: Left;  . EYE SURGERY Right    cataract surgery  . FOOT FUSION Right 2018   metal in foot  . GALLBLADDER SURGERY  1970  . GASTRIC BYPASS  2010   lost 178 lbs and regained 40 lbs last few years  . HUMERUS FRACTURE SURGERY Right    metal plate with screws  . internal bleeding  2016   ulcer in past  . LOWER EXTREMITY ANGIOGRAM Left 07/08/2018   Procedure: LOWER EXTREMITY ANGIOGRAM;  Surgeon: Algernon Huxley, MD;  Location: [facility] ORS;  Service: Vascular;  Laterality: Left;  . LOWER EXTREMITY ANGIOGRAPHY Left 05/27/2018   Procedure: LOWER EXTREMITY ANGIOGRAPHY;  Surgeon: Algernon Huxley, MD;  Location: [facility];  Service: Cardiovascular;  Laterality: Left;  . LOWER EXTREMITY ANGIOGRAPHY  Right 12/07/2018   Procedure: LOWER EXTREMITY ANGIOGRAPHY;  Surgeon: Algernon Huxley, MD;  Location: [facility];  Service: Cardiovascular;  Laterality: Right;  . MOUTH SURGERY     root canals and crowns and extractions  . OVARY SURGERY    . SKIN GRAFT Right 2018   RT foot. foot has been rebuilt.  it is full of metal  . TENOTOMY ACHILLES TENDON Right    Percuntaneous. metal in foot  . TONSILLECTOMY    . WOUND DEBRIDEMENT Left 07/23/2018   Procedure: DEBRIDEMENT WOUND;  Surgeon: Algernon Huxley, MD;  Location: [facility] OR[facility];  Service: Vascular;  Laterality: Left;    Vitals:   01/14/19 1357  BP: 130/77    Subjective Assessment - 01/14/19 1357    Subjective  Patient reports she is feeling a bit sore today, mostly in left hip but R hip was hurting yesterday. She attributes her increased pain right now to pulling weeds and cleaning out vents. She sat on a folding chair in front of the vents while cleaning them. 4-5/10 pain today. States she felt okay following last treatment session. She is completing about 20 sit <> stands a day outside of her usual mobility. She state she had two good blood presure readings this week. She has cut her smoking down but is still struggling with it, epecially in the evenings.    Pertinent History  Patient is a 73 y.o. female who presents to outpatient physical therapy with a referral for medical diagnosis of degeneration of lumbar intervertebral disc. This patient's chief complaints consist of chronic low back pain and foot pain, weakness, stiffness, leading to the following functional deficits: difficulty with with basic ADLs, IADLs, ambulation. She used to get injections to help control back pain but it has recently worsened due to being unable to get injections during [place] pandemic. The patient has been informed of current processes in place at [facility] to protect patients from Covid-19 exposure including social distancing, schedule modifications, and new  cleaning procedures. After discussing their particular risk with a therapist based on the patient's personal risk factors, the patient has decided to proceed with in-person therapy. Pt is scheduled for R LE angiography on 12/07/2018. She has recently started smoking again with the stress of COVID19 pandemic and feels strongly she needs to stop. Surgeon has stated in the past he will not complete surgery if she is smoking.  Relevant past medical history and comorbidities include underwent extensive vascular surgeries in Jan 2020 to improve blood flow, similar surgery for R leg scheduled 12/07/2018. Has a history of left carotid artery stenosis,  but not enough to have surgery, has a history of multiple arterial surgeries, history of R cataract surgery, colon surgery, cholecystectomy, R foot skin graft, ovary surgery, gastric bypass, R foot fusion, abdominal hysterectomy; current smoker, plantar fasciitis, peripheral arterial disease, LVH, diabetes mellitus with neuropathy, irregular heartbeat, hiatal hernia, GERD, stenosis of left heart ventricle, depression, lumbar degenerative disc disease, cardiac murmur, asthma, anxiety, obesity, HTN, and seasonal allergies She has been advised not to lift over 10 pounds by physician    Limitations  Sitting;Standing;Walking;House hold activities;Lifting    How long can you sit comfortably?  <1 hour    How long can you stand comfortably?  1 minute    How long can you walk comfortably?  2 minutes, depends on what she has with her. Able to walk at fair with rollator. To mailbox 120 feet causes shaking in R leg.      Diagnostic tests  MRI lumbar spine report 06/14/2017: "IMPRESSION: 1. Stable degenerative of lumbar spondylosis and scoliosis with multilevel disc disease and facet disease. 2. Stable right foraminal stenosis at L5-S1 due to right-sided disc    Currently in Pain?  Yes    Pain Score  5     Pain Location  Back    Pain Orientation  Right;Left    Pain Descriptors /  Indicators  Sharp;Aching    Pain Type  Chronic pain    Pain Radiating Towards  bilateral hips, L > R currently           TREATMENT:  Therapeutic exercise:to centralize symptoms and improve ROM, strength, muscular endurance, and activity tolerance required for successful completion of functional activities. - blood pressure measurementmanually measured at the wrist toensure safety of exercise. Determined safe for exercise (see above for values). -NuStep level3using bilateral upper and lower extremities. Seatsetting 8/handle setting7. For improved extremity mobility, muscular endurance, and activity tolerance; and to induce the analgesic effect of aerobic exercise, stimulate improved joint nutrition, and prepare body structures and systems for following interventions. x61mnuteswith SPM above 60during subjective exam.   - standing hip hikes with BUE support. Tactile cuing and AAROM to assist left hip abductors in lifting R hip. Patient able to prevent R hip from dropping part of the time. 3x10 each side, alternating each set. More challenging to do repeated reps each side than alternating sides.   - autoelongation (attempted seated on theraball and chair with difficulty, then successfully using chair with dynadisc on top) with two poles positioned at sides pushing into floor with arms in 90/90 position with poles held at elbows. Practicing pressing down with lats and shoulders thorough arms and poles while breathing and focusing on lengthening spine towards celing to improve posture and spinal support muscles. Extensive tactile cuing from clinician and mirror feedback to help patient self-correct and become more aware. Practiced 5 minutes in sitting to fatigue. Required min A to transfer to/from theraball and maintain balance on ball. Continues to demonstrate poor core strength and coordination, but much better able to isolate shoulder girdle and breath from spinal support compared to  last session.   - sit <> stand from 17 inch chair lifting 2kg theraball overhead with both hands each rep. x10 very winded and states her legs feel tired. Completed autoelongation exercise between sets. Patient with failed attempt to stand with ball directly following autoelongatoin and stated she could not do 10 more sit <> stands. Provided extending seated rest break, and patient became willing to complete 5 more reps. Able to get to  3 before stopping to rest, then completed final 2. Fatigues very quickly.   -Education on HEP including importance of practicing sit <> stands at home in groups of 10 reps to more effectively stimulate adaptive change for improved functional strength and power.   HOME EXERCISE PROGRAM Access Code: [email or web address]  URL: [email or web address]  Date: 12/03/2018  Prepared by: Rosita Kea   Exercises   Sit to Stand without Arm Support - 30 reps - 2x daily   Standing Hip Hiking - 3 sets - 10 reps - 2x daily   Supine Bridge with Resistance Band - 3 sets - 10 reps - 1 second hold - 2x daily   Sidelying Hip Abduction - 3 sets - 10 reps - 2x daily   Prone Hip Extension with Bent Knee - 3 sets - 10 reps - 2x daily   Prone Hip Abduction on Slider - 3 sets - 10 reps - 2x daily  Breathing exercise x 5 min    PT Education - 01/15/19 0824    Education provided  Yes    Education Details  exercise form/technique, self management techniques HEP    Person(s) Educated  Patient    Methods  Explanation;Demonstration;Tactile cues;Verbal cues    Comprehension  Verbalized understanding;Returned demonstration;Verbal cues required;Tactile cues required       PT Short Term Goals - 01/05/19 1738      PT SHORT TERM GOAL #1   Title  Be independent with initial home exercise program for self-management of symptoms.    Baseline  Initial HEP provided at [facility] (11/17/2018); was completing until vascular procedure, re-instated 12/15/2018 (12/15/2018);    Time  2     Period  Weeks    Status  Partially Met    Target Date  01/19/19        PT Long Term Goals - 12/17/18 1422      PT LONG TERM GOAL #1   Title  Be independent with a long-term home exercise program for self-management of symptoms.     Baseline  Initial HEP provided at [facility] (11/17/2018); was doing better before vascular surgery, is working at returning to [facility] since procedure (12/15/2018).    Time  12    Period  Weeks    Status  Partially Met    Target Date  02/09/19      PT LONG TERM GOAL #2   Title  Patient will improve 6MWT to equal or greater than 1000 feet with LRAD to demonstrate improved activity tolerance for community ambulation.     Baseline  6 Minute Walk Test: 200 feet with SPC and supervision. Stopped at 1:43 due to fatigue and leg pain. (11/17/2018); 457 feet SPC, on seated and one standing break (12/15/2018);    Time  12    Period  Weeks    Status  Partially Met    Target Date  02/09/19      PT LONG TERM GOAL #3   Title  Patient will demonstrate improved 5 Times Sit to Stand test to 11 seconds from chair height with no UE support to demonstrate improvement in LE power and functional strength for daily activities.    Baseline  5 Time Sit to Stand: 27 seconds from chair height no UE support (11/17/2018); 18 seconds, no UE support from chair-high plinth (12/15/2018);    Time  12    Period  Weeks    Status  Partially Met    Target Date  02/09/19      PT  LONG TERM GOAL #4   Title  Patient will ambulate faster than 1.24ms on the 10 Meter Walk Test to improve ability to cross street safey for community participation.     Baseline  testing deferred to next session (11/17/2018); 1.37 m/sec with SPC (11/24/2018); 1.3 m/sec with SPC (12/15/2018);    Time  12    Period  Weeks    Status  Achieved    Target Date  02/09/19      PT LONG TERM GOAL #5   Title  Patient will score equal or greater than  25/30 on the Functional Gait Assessment to demonstrate low fall risk to improve her ability  to participate safely in community activities.     Baseline  testing deferred to next session (11/17/2018); 11/30 high fall risk (11/24/2018); unable to measure due to time limitation (12/15/2018); 13/30 high fall risk (12/17/2018);    Time  12    Period  Weeks    Status  Partially Met    Target Date  02/09/19            Plan - 01/15/19 [id number]   Clinical Impression Statement  Patient tolerated treatment well but was limited by fatigue today and required several extended rest breaks. Demonstrates improved awareness and ability to isolate postural muscles. Very limited in endurance with sit <> stand exercise. Required significant encouragement to continue with exercise. Will benefit from more bouts of demanding functional exercises such as sit <> stand and recommend incorporating into next treatment session as tolerated. Patient reported no increase in pain by end of session. Patient would benefit from continued physical therapy to address remaining impairments and functional limitations to work towards stated goals and return to PLOF or maximal functional independence    Personal Factors and Comorbidities  Age;Comorbidity 3+;Fitness;Time since onset of injury/illness/exacerbation;Past/Current Experience;Other    Comorbidities  underwent extensive vascular surgeries in Jan 2020 to improve blood flow, similar surgery for R leg scheduled 12/07/2018. Has a history of left carotid artery stenosis, but not enough to have surgery, has a history of multiple arterial surgeries, history of R cataract surgery, colon surgery, cholecystectomy, R foot skin graft, ovary surgery, gastric bypass, R foot fusion, abdominal hysterectomy; current smoker, plantar fasciitis, peripheral arterial disease, LVH, diabetes mellitus with neuropathy, irregular heartbeat, hiatal hernia, GERD, stenosis of left heart ventricle, depression, lumbar degenerative disc disease, cardiac murmur, asthma, anxiety, obesity, HTN, and seasonal  allergies She has been advised not to lift over 10 pounds by physician.      Examination-Activity Limitations  Bathing;Carry;Lift;Sit;Stand;Locomotion Level;Toileting;Dressing;Squat;Transfers;Stairs;Hygiene/Grooming;Other    Examination-Participation Restrictions  Community Activity;Volunteer;Cleaning;Interpersonal Relationship;Yard Work;Other    Rehab Potential  Fair    Clinical Impairments Affecting Rehab Potential  (+) motivated (-) muliple co morbidities, chronic condition    PT Frequency  2x / week    PT Duration  12 weeks    PT Treatment/Interventions  Moist Heat;Patient/family education;Neuromuscular re-education;Therapeutic exercise;Manual techniques;ADLs/Self Care Home Management;Electrical Stimulation;Gait training;Stair training;Functional mobility training;Therapeutic activities;Balance training;Passive range of motion;Dry needling;Energy conservation;Spinal Manipulations;Joint Manipulations;Cryotherapy    PT Next Visit Plan  continue with progressive LE and  functional strengtheing; incorporate postural strengthening    PT Home Exercise Plan  Medbridge Access Code: [email or web address]   Consulted and Agree with Plan of Care  Patient       Patient will benefit from skilled therapeutic intervention in order to improve the following deficits and impairments:  Decreased strength, Impaired flexibility, Decreased activity tolerance, Impaired perceived functional ability, Pain, Decreased endurance, Difficulty  walking, Abnormal gait, Decreased knowledge of use of DME, Decreased skin integrity, Decreased range of motion, Impaired sensation, Improper body mechanics, Obesity, Postural dysfunction, Increased edema, Decreased mobility, Decreased balance, Cardiopulmonary status limiting activity, Decreased coordination  Visit Diagnosis: 1. Muscle weakness (generalized)   2. Chronic bilateral low back pain, unspecified whether sciatica present   3. Difficulty in walking, not elsewhere classified   4. History  of falling        Problem List Patient Active Problem List   Diagnosis Date Noted  . Lower extremity edema 07/31/2018  . Pressure injury of skin 07/24/2018  . Surgical site infection 07/23/2018  . Wound of left leg 07/23/2018  . Atherosclerosis of artery of extremity with rest pain ([place]) 07/08/2018  . Shortness of breath 06/02/2018  . Bruit 06/02/2018  . Coronary artery disease of native artery of native heart with stable angina pectoris ([place]) 06/02/2018  . Atherosclerosis of native arteries of extremity with intermittent claudication ([place]) 05/19/2018  . Peripheral arterial disease ([place]) 05/06/2018  . Personal history of colonic polyps   . Aortic stenosis 12/24/2017  . LVH (left ventricular hypertrophy) 12/24/2017  . Hypertension with heart disease 12/24/2017  . Left atrial dilatation 12/12/2017  . Cardiac murmur 12/12/2017  . Gastrojejunal ulcer 12/04/2017  . Hyperphosphatemia 12/04/2017  . Spinal stenosis of lumbar region 06/18/2017  . Degeneration of lumbar intervertebral disc 06/02/2017  . Assistance needed with transportation 05/26/2017  . Financial difficulties 05/26/2017  . Needs assistance with community resources 05/26/2017  . Moderate recurrent major depression ([place]) 05/26/2017  . Non-healing wound of lower extremity 02/06/2017  . Status post ankle arthrodesis 12/11/2016  . Controlled substance agreement broken 10/11/2016  . Low back pain 09/25/2016  . Osteoarthritis of right subtalar joint 07/11/2016  . Posterior tibial tendinitis of right lower extremity 07/11/2016  . Breast cancer screening 06/18/2016  . Knee pain 04/03/2016  . Hyponatremia 03/01/2016  . High triglycerides 12/24/2015  . Pes planus of both feet 11/16/2015  . Plantar fasciitis of right foot 11/16/2015  . Heel spur 11/16/2015  . Diabetic neuropathy ([place]) 11/16/2015  . Right ankle pain 11/16/2015  . Medication monitoring encounter 11/16/2015  . Chronic pain of multiple joints 08/15/2015  .  Abnormal mammogram of right breast 06/19/2015  . Cerumen impaction 03/16/2015  . Hyperlipidemia 12/14/2014  . History of transfusion of packed RBC 12/14/2014  . Hx of smoking 12/14/2014  . Status post bariatric surgery 12/14/2014  . Morbid obesity ([place]) 12/14/2014  . Chronic radicular lumbar pain 12/14/2014  . History of GI bleed 11/12/2014  . GERD without esophagitis 11/12/2014  . History of small bowel obstruction 09/07/2014    Everlean Alstrom. [name], PT, DPT 01/15/19, 8:35 AM  [facility] [address], [place], [address] Phone: [phone]   Fax:  [phone]  Name: MIKITA LESMEISTER MRN: [id number] Date of Birth: 04/08/1946

## 2019-01-15 ENCOUNTER — Encounter: Payer: Self-pay | Admitting: Physical Therapy

## 2019-01-15 ENCOUNTER — Other Ambulatory Visit: Payer: Self-pay | Admitting: Nurse Practitioner

## 2019-01-15 DIAGNOSIS — K219 Gastro-esophageal reflux disease without esophagitis: Secondary | ICD-10-CM

## 2019-01-18 ENCOUNTER — Ambulatory Visit: Payer: Self-pay

## 2019-01-18 ENCOUNTER — Other Ambulatory Visit: Payer: Self-pay

## 2019-01-18 DIAGNOSIS — Z598 Other problems related to housing and economic circumstances: Secondary | ICD-10-CM

## 2019-01-18 DIAGNOSIS — Z720 Tobacco use: Secondary | ICD-10-CM

## 2019-01-18 DIAGNOSIS — Z599 Problem related to housing and economic circumstances, unspecified: Secondary | ICD-10-CM

## 2019-01-18 NOTE — Chronic Care Management (AMB) (Signed)
Chronic Care Management   Follow Up Note   01/19/2019 Name: Andrea Holden MRN: [id number] DOB: April 26, 1946  Referred by: Andrea Severance, NP Reason for referral : Chronic Care Management (follow up )   Subjective: "I have been doing OK" "My legs feel better since I have had both vascular surgeries but it is still hard to walk because of my back"   Objective:  BP Readings from Last 3 Encounters:  01/14/19 130/77  01/12/19 138/78  01/11/19 128/60   Lab Results  Component Value Date   HGBA1C 6.9 (H) 12/18/2018   HGBA1C 6.8 (H) 04/23/2018   HGBA1C 6.8 (H) 12/12/2017   Lab Results  Component Value Date   MICROALBUR 0.6 12/12/2017   LDLCALC 70 12/18/2018   CREATININE 0.97 (H) 12/18/2018   Lab Results  Component Value Date   CHOL 135 12/18/2018   HDL 41 (L) 12/18/2018   LDLCALC 70 12/18/2018   TRIG 161 (H) 12/18/2018   CHOLHDL 3.3 12/18/2018     Assessment: Andrea J Bromaghimis a 73 y.o.year old [place], NPfor primary care. [facility] Transitions of Care RNasked the CCM team to consult the patient for assistance with chronic disease management related to multiple chronic diseases. Referral was placed1/13/19.  RN CM attempted to follow up with Andrea Holden to discuss health goals as she was lost to follow up but expressed interest in re-engagement to Andrea Holden during her recent Medicare Annual Wellness Exam.  Review of patient status, including review of consultants reports, relevant laboratory and other test results, and collaboration with appropriate care team members and the patient's provider was performed as part of comprehensive patient evaluation and provision of chronic care management services.    Goals Addressed            This Visit's Progress   . "i want to get back to running in the dog ring" (pt-stated)       Current Barriers:  . Current health status . Lack of stamina and strength . Recent vascular surgery    Nurse Case Manager Clinical Goal(s): Goal revised 01/18/2019 Over the next 30 days, patient will report improvement in mobility secondary to ongoing engagement with PT  Interventions:  . Assessed for continued engagement with PT . Encouraged patient to do daily home exercises as advised by PT . Provided emotional support and reassurance   Patient Self Care Activities:  . Take all medications as prescribed . Continue to engage with PT twice weekly  Please see past updates in goals section for documentation of goal progression      . I have cut back on my smoking but have not stopped (pt-stated)       Current Barriers:  . Anxiety related to social isolation secondary to covid-19  Nurse Case Manager Clinical Goal(s):  Marland Kitchen Over the next 30 days, patient will continue reduce her smoking by taking her chantix as prescribed, utilizing 1800 Quit Now  Interventions:  . Confirmed patient remains engaged with [employer] . Discussed reason for choosing to smoke again . Provided emotional support and reassurance . Discussed how smoking affects her PVD . Encouraged patient to utilize 1800 Quit Now (she has used in the past)  Patient Self Care Activities:  . Self administers medications as prescribed . Attends all scheduled provider appointments . Calls pharmacy for medication refills . Attends church or other social activities . Performs ADL's independently . Performs IADL's independently . Calls provider office for new concerns or questions  Initial goal documentation     . COMPLETED: i need help setting up my new Fitbit and my new BP monitor (pt-stated)       Current Barriers:  . Lacks caregiver support.  . Knowledge Deficits related to understanding technology of Fitbit and how to set up  Nurse Case Manager Clinical Goal(s):  Marland Kitchen. Over the next 14 days, patient will demonstrate improved health management independence as evidenced byutilizing her new BP cuff and Fitbit for  monitoring activity, HR, and BP  Interventions:  . Provided education to patient re: utilization of Fitbit and importance of setting activity goals to decrease sedentary lifestyle . Advised patient, providing education and rationale, to monitor blood pressure daily and record, calling Dr. Sherie DonLada for findings outside established parameters.   Patient Self Care Activities:  . Increase daily activity by monitoring "steps" . Check and record BP daily  Plan:  . RNCM will provide technology support as needed . Patient will contact the CCM Team with any questions regarding technology reviewed today  Please see past updates related to this goal by clicking on the "Past Updates" button in the selected goal          Telephone follow up appointment with care management team member scheduled for: 30 days     Andrea Fors E. Suzie PortelaPayne, RN, BSN Nurse Care Coordinator [facility]  [phone]) [phone]

## 2019-01-19 ENCOUNTER — Encounter: Payer: Self-pay | Admitting: Physical Therapy

## 2019-01-19 ENCOUNTER — Ambulatory Visit: Payer: Medicare Other | Admitting: Physical Therapy

## 2019-01-19 VITALS — BP 120/70

## 2019-01-19 DIAGNOSIS — M6281 Muscle weakness (generalized): Secondary | ICD-10-CM

## 2019-01-19 DIAGNOSIS — G8929 Other chronic pain: Secondary | ICD-10-CM | POA: Diagnosis not present

## 2019-01-19 DIAGNOSIS — R262 Difficulty in walking, not elsewhere classified: Secondary | ICD-10-CM

## 2019-01-19 DIAGNOSIS — M5441 Lumbago with sciatica, right side: Secondary | ICD-10-CM | POA: Diagnosis not present

## 2019-01-19 DIAGNOSIS — Z9181 History of falling: Secondary | ICD-10-CM

## 2019-01-19 DIAGNOSIS — M62838 Other muscle spasm: Secondary | ICD-10-CM | POA: Diagnosis not present

## 2019-01-19 DIAGNOSIS — M545 Low back pain, unspecified: Secondary | ICD-10-CM

## 2019-01-19 DIAGNOSIS — M5440 Lumbago with sciatica, unspecified side: Secondary | ICD-10-CM | POA: Diagnosis not present

## 2019-01-19 NOTE — Patient Instructions (Signed)
Thank you allowing the Chronic Care Management Team to be a part of your care! It was a pleasure speaking with you today!  1. Please continue to work with PT 2. Call 1800 Quit Now for support with smoking cessation 3. Continue to take all your medications as prescribed 4. Continue to engage with Clinic LCSW as needed  CCM (Chronic Care Management) Team   Yvone NeuPortia Haidan Nhan RN, BSN Nurse Care Coordinator  [phone]  Karalee HeightJulie Hedrick PharmD  Clinical Pharmacist  [phone]   [place] [address], LCSW Clinical Social Worker [phone]) [phone]  Goals Addressed            This Visit's Progress   . "i want to get back to running in the dog ring" (pt-stated)       Current Barriers:  . Current health status . Lack of stamina and strength . Recent vascular surgery   Nurse Case Manager Clinical Goal(s): Goal revised 01/18/2019 Over the next 30 days, patient will report improvement in mobility secondary to ongoing engagement with PT  Interventions:  . Assessed for continued engagement with PT . Encouraged patient to do daily home exercises as advised by PT . Provided emotional support and reassurance   Patient Self Care Activities:  . Take all medications as prescribed . Continue to engage with PT twice weekly  Please see past updates in goals section for documentation of goal progression      . I have cut back on my smoking but have not stopped (pt-stated)       Current Barriers:  . Anxiety related to social isolation secondary to covid-19  Nurse Case Manager Clinical Goal(s):  Marland Kitchen. Over the next 30 days, patient will continue reduce her smoking by taking her chantix as prescribed, utilizing 1800 Quit Now  Interventions:  . Confirmed patient remains engaged with [employer] LCSW . Discussed reason for choosing to smoke again . Provided emotional support and reassurance . Discussed how smoking affects her PVD . Encouraged patient to utilize 1800 Quit Now (she has used in the  past)  Patient Self Care Activities:  . Self administers medications as prescribed . Attends all scheduled provider appointments . Calls pharmacy for medication refills . Attends church or other social activities . Performs ADL's independently . Performs IADL's independently . Calls provider office for new concerns or questions  Initial goal documentation     . COMPLETED: i need help setting up my new Fitbit and my new BP monitor (pt-stated)       Current Barriers:  . Lacks caregiver support.  . Knowledge Deficits related to understanding technology of Fitbit and how to set up  Nurse Case Manager Clinical Goal(s):  Marland Kitchen. Over the next 14 days, patient will demonstrate improved health management independence as evidenced byutilizing her new BP cuff and Fitbit for monitoring activity, HR, and BP  Interventions:  . Provided education to patient re: utilization of Fitbit and importance of setting activity goals to decrease sedentary lifestyle . Advised patient, providing education and rationale, to monitor blood pressure daily and record, calling Dr. Sherie DonLada for findings outside established parameters.   Patient Self Care Activities:  . Increase daily activity by monitoring "steps" . Check and record BP daily  Plan:  . RNCM will provide technology support as needed . Patient will contact the CCM Team with any questions regarding technology reviewed today  Please see past updates related to this goal by clicking on the "Past Updates" button in the selected goal  The patient verbalized understanding of instructions provided today and declined a print copy of patient instruction materials.   Telephone follow up appointment with care management team member scheduled for: 30 days  SYMPTOMS OF A STROKE   You have any symptoms of stroke. "BE FAST" is an easy way to remember the main warning signs: ? B - Balance. Signs are dizziness, sudden trouble walking, or loss of balance. ? E  - Eyes. Signs are trouble seeing or a sudden change in how you see. ? F - Face. Signs are sudden weakness or loss of feeling of the face, or the face or eyelid drooping on one side. ? A - Arms. Signs are weakness or loss of feeling in an arm. This happens suddenly and usually on one side of the body. ? S - Speech. Signs are sudden trouble speaking, slurred speech, or trouble understanding what people say. ? T - Time. Time to call emergency services. Write down what time symptoms started.  You have other signs of stroke, such as: ? A sudden, very bad headache with no known cause. ? Feeling sick to your stomach (nausea). ? Throwing up (vomiting). ? Jerky movements you cannot control (seizure).  SYMPTOMS OF A HEART ATTACK  What are the signs or symptoms? Symptoms of this condition include:  Chest pain. It may feel like: ? Crushing or squeezing. ? Tightness, pressure, fullness, or heaviness.  Pain in the arm, neck, jaw, back, or upper body.  Shortness of breath.  Heartburn.  Indigestion.  Nausea.  Cold sweats.  Feeling tired.  Sudden lightheadedness.

## 2019-01-19 NOTE — Therapy (Signed)
[facility] [address], [place], [address] Phone: [phone]   Fax:  [phone]  Physical Therapy Treatment  Patient Details  Name: KALAYSIA DEMONBREUN MRN: [id number] Date of Birth: [birth year] Referring Provider (PT): Fredderick Severance, NP    Encounter Date: 01/19/2019  PT End of Session - 01/19/19 1445    Visit Number  15    Number of Visits  24    Date for PT Re-Evaluation  02/09/19    Authorization Type  [facility] Medicare reporting period from 12/15/2018    Authorization Time Period  Current cert period 08/24/8248 - 02/09/2019 (latest PN: 12/15/2018)    Authorization - Visit Number  8    Authorization - Number of Visits  10    PT Start Time  1351    PT Stop Time  1430    PT Time Calculation (min)  39 min    Activity Tolerance  Patient limited by fatigue;Patient limited by pain    Behavior During Therapy  [facility] for tasks assessed/performed       Past Medical History:  Diagnosis Date  . Allergy    seasonal allergies  . Anemia 2014   needed 5 units of blood d/t passing out, weak  . Anxiety   . Aortic stenosis 12/24/2017   Echo Aug 2018  . Arthritis   . Asthma    allergy induced asthma  . Cardiac murmur 12/12/2017  . Cataract   . Cataract    left  . Complication of anesthesia    arrhythmia following colonoscopy  . Degenerative disc disease, lumbar   . Degenerative disc disease, lumbar   . Depression   . Diabetes mellitus   . Diabetic neuropathy ([place]) 11/16/2015  . Dysrhythmia    stenosis of left ventricle  . GERD (gastroesophageal reflux disease)   . H/O transfusion    patient was given 5 units of blood while at [place], blood type O+  . History of chicken pox   . History of hiatal hernia   . History of measles, mumps, or rubella   . HOH (hard of hearing)    does not use hearing aides yet  . Hyperlipidemia   . Hypertension   . Irregular heartbeat   . LVH (left ventricular hypertrophy)  12/24/2017   Echo Aug 2018  . Neuropathy   . Opiate use 11/16/2015  . Peripheral arterial disease ([place]) 05/06/2018   At rest, left > right; refer to vasc  . Pes planus of both feet 11/16/2015  . Plantar fasciitis of right foot 11/16/2015  . Wheezing     Past Surgical History:  Procedure Laterality Date  . ABDOMINAL HYSTERECTOMY    . APPLICATION OF WOUND VAC Left 07/23/2018   Procedure: APPLICATION OF WOUND VAC;  Surgeon: Algernon Huxley, MD;  Location: [facility] ORS;  Service: Vascular;  Laterality: Left;  . banck injections    . CATARACT EXTRACTION W/PHACO Right 05/30/2015   Procedure: CATARACT EXTRACTION PHACO AND INTRAOCULAR LENS PLACEMENT (IOC);  Surgeon: Birder Robson, MD;  Location: [facility] ORS;  Service: Ophthalmology;  Laterality: Right;  [place] 00:57 AP% 20.9 CDE 11.99 fluid pack lot #[id number]  . CHOLECYSTECTOMY  1970  . COLON SURGERY  2013   blocked colon  . COLONOSCOPY WITH PROPOFOL N/A 01/20/2018   Procedure: COLONOSCOPY WITH PROPOFOL;  Surgeon: Lucilla Lame, MD;  Location: [facility] ENDOSCOPY;  Service: Endoscopy;  Laterality: N/A;  . ENDARTERECTOMY FEMORAL Left 07/08/2018   Procedure: ENDARTERECTOMY  FEMORAL;  Surgeon: Algernon Huxley, MD;  Location: [facility] ORS;  Service: Vascular;  Laterality: Left;  . EYE SURGERY Right    cataract surgery  . FOOT FUSION Right 2018   metal in foot  . GALLBLADDER SURGERY  1970  . GASTRIC BYPASS  2010   lost 178 lbs and regained 40 lbs last few years  . HUMERUS FRACTURE SURGERY Right    metal plate with screws  . internal bleeding  2016   ulcer in past  . LOWER EXTREMITY ANGIOGRAM Left 07/08/2018   Procedure: LOWER EXTREMITY ANGIOGRAM;  Surgeon: Algernon Huxley, MD;  Location: [facility] ORS;  Service: Vascular;  Laterality: Left;  . LOWER EXTREMITY ANGIOGRAPHY Left 05/27/2018   Procedure: LOWER EXTREMITY ANGIOGRAPHY;  Surgeon: Algernon Huxley, MD;  Location: [facility];  Service: Cardiovascular;  Laterality: Left;  . LOWER EXTREMITY ANGIOGRAPHY Right 12/07/2018    Procedure: LOWER EXTREMITY ANGIOGRAPHY;  Surgeon: Algernon Huxley, MD;  Location: [facility];  Service: Cardiovascular;  Laterality: Right;  . MOUTH SURGERY     root canals and crowns and extractions  . OVARY SURGERY    . SKIN GRAFT Right 2018   RT foot. foot has been rebuilt.  it is full of metal  . TENOTOMY ACHILLES TENDON Right    Percuntaneous. metal in foot  . TONSILLECTOMY    . WOUND DEBRIDEMENT Left 07/23/2018   Procedure: DEBRIDEMENT WOUND;  Surgeon: Algernon Huxley, MD;  Location: [facility] OR[facility];  Service: Vascular;  Laterality: Left;    Vitals:   01/19/19 1356  BP: 120/70    Subjective Assessment - 01/19/19 1356    Subjective  Patient reports she took a pain pill prior to coming to physical therapy as she has for the last 2-3 weeks. She states her pain is more on the right today than the left. She groomed her dogs this weekend, and the increased activity caused increased pain. She felt good afer last treatment session. She has had some gastric upset since last night but think she is feeling good enough to continue with physical therapy. She states this happens sometimes since she has a gastric bipass. She also has cut her smoking down to one pack for 3 days and thinks this is partly why she has been feeling better overall.    Pertinent History  Patient is a 73 y.o. female who presents to outpatient physical therapy with a referral for medical diagnosis of degeneration of lumbar intervertebral disc. This patient's chief complaints consist of chronic low back pain and foot pain, weakness, stiffness, leading to the following functional deficits: difficulty with with basic ADLs, IADLs, ambulation. She used to get injections to help control back pain but it has recently worsened due to being unable to get injections during [place] pandemic. The patient has been informed of current processes in place at [facility]patient [facility] to protect patients from Covid-19 exposure including social distancing,  schedule modifications, and new cleaning procedures. After discussing their particular risk with a therapist based on the patient's personal risk factors, the patient has decided to proceed with in-person therapy. Pt is scheduled for R LE angiography on 12/07/2018. She has recently started smoking again with the stress of COVID19 pandemic and feels strongly she needs to stop. Surgeon has stated in the past he will not complete surgery if she is smoking.  Relevant past medical history and comorbidities include underwent extensive vascular surgeries in Jan 2020 to improve blood flow, similar surgery for R  leg scheduled 12/07/2018. Has a history of left carotid artery stenosis, but not enough to have surgery, has a history of multiple arterial surgeries, history of R cataract surgery, colon surgery, cholecystectomy, R foot skin graft, ovary surgery, gastric bypass, R foot fusion, abdominal hysterectomy; current smoker, plantar fasciitis, peripheral arterial disease, LVH, diabetes mellitus with neuropathy, irregular heartbeat, hiatal hernia, GERD, stenosis of left heart ventricle, depression, lumbar degenerative disc disease, cardiac murmur, asthma, anxiety, obesity, HTN, and seasonal allergies She has been advised not to lift over 10 pounds by physician    Limitations  Sitting;Standing;Walking;House hold activities;Lifting    How long can you sit comfortably?  <1 hour    How long can you stand comfortably?  1 minute    How long can you walk comfortably?  2 minutes, depends on what she has with her. Able to walk at fair with rollator. To mailbox 120 feet causes shaking in R leg.      Diagnostic tests  MRI lumbar spine report 06/14/2017: "IMPRESSION: 1. Stable degenerative of lumbar spondylosis and scoliosis with multilevel disc disease and facet disease. 2. Stable right foraminal stenosis at L5-S1 due to right-sided disc    Currently in Pain?  Yes    Pain Score  4     Pain Location  Back    Pain Orientation  Right     Pain Descriptors / Indicators  Aching    Pain Onset  More than a month ago    Pain Frequency  Constant         TREATMENT:  Therapeutic exercise:to centralize symptoms and improve ROM, strength, muscular endurance, and activity tolerance required for successful completion of functional activities. - blood pressure measurementmanually measured at the wrist toensure safety of exercise.Determined safe for exercise(see above for values). -NuStep level3using bilateral upper and lower extremities. Seatsetting 8/handle setting7. For improved extremity mobility, muscular endurance, and activity tolerance; and to induce the analgesic effect of aerobic exercise, stimulate improved joint nutrition, and prepare body structures and systems for following interventions. x30mnuteswith SPM above 50during subjective exam.   - autoelongation seated on chair with dynadisc on top, with two poles positioned at sides pushing into floor with arms in 90/90 position with poles held at elbows. Practicing pressing down with lats and shoulders thorough arms and poles while breathing and focusing on lengthening spine towards celing to improve posture and spinal support muscles. Extensive tactile cuing from clinician and mirror feedback to help patient self-correct and become more aware. Practiced 2 minutes. Patient struggling to use abdominal muscles for breathing. Dominant chest breathing pattern.  - seated diaphragmatic breathing with hand on abdomen and sternum for several minutes until patient could coordinate abdominal contraction with exhalation at least 5 reps sequentially. Continues to use chest muscles predominately.  - sit <> stand from 17 inch chair lifting 2kg theraball overhead with both hands each rep. 2x3-4 request to stop due to knee and back pain with each rep, did not get better with further attempts, modified to no ball x 5 - continues to feel she cannot continue due to back pain and not  improving with reps, pain stops when she rests. X 10 modified with 2 inch airex on seat - patient better tolerates. Patient educated on purpose of continuing despite pain to see if it will get better. Discussed continued importance of reporting pain so clinician and pt can decide together if continuing is warranted. Further clarified that pt's pain was shooting each attempt to stand, but eased off with  rest.   Manual therapy: to reduce pain and tissue tension, improve range of motion, neuromodulation, in order to promote improved ability to complete functional activities. - seated STM to R lower lumbar paraspinals and surrounding musculature at the point of most pain reported by patient. Trigger point release resulted in palpable softening of the tissues and reports of reduced pain and easing of the pain when standing up and walking following. Discussed incorporating more manual into treatment plan for pain relief while continuing to make time for exercise for more lasting relief and functional independence.   HOME EXERCISE PROGRAM Access Code: [email or web address]  URL: [email or web address]  Date: 12/03/2018  Prepared by: Rosita Kea   Exercises   Sit to Stand without Arm Support - 30 reps - 2x daily   Standing Hip Hiking - 3 sets - 10 reps - 2x daily   Supine Bridge with Resistance Band - 3 sets - 10 reps - 1 second hold - 2x daily   Sidelying Hip Abduction - 3 sets - 10 reps - 2x daily   Prone Hip Extension with Bent Knee - 3 sets - 10 reps - 2x daily   Prone Hip Abduction on Slider - 3 sets - 10 reps - 2x daily  Breathing exercise x 5 min     PT Education - 01/19/19 1448    Education provided  Yes    Education Details  exercise form/technique, self management techniques    Person(s) Educated  Patient    Methods  Explanation;Demonstration;Tactile cues;Verbal cues    Comprehension  Verbalized understanding;Returned demonstration;Verbal cues required;Tactile cues required        PT Short Term Goals - 01/05/19 1738      PT SHORT TERM GOAL #1   Title  Be independent with initial home exercise program for self-management of symptoms.    Baseline  Initial HEP provided at [facility] (11/17/2018); was completing until vascular procedure, re-instated 12/15/2018 (12/15/2018);    Time  2    Period  Weeks    Status  Partially Met    Target Date  01/19/19        PT Long Term Goals - 12/17/18 1422      PT LONG TERM GOAL #1   Title  Be independent with a long-term home exercise program for self-management of symptoms.     Baseline  Initial HEP provided at [facility] (11/17/2018); was doing better before vascular surgery, is working at returning to [facility] since procedure (12/15/2018).    Time  12    Period  Weeks    Status  Partially Met    Target Date  02/09/19      PT LONG TERM GOAL #2   Title  Patient will improve 6MWT to equal or greater than 1000 feet with LRAD to demonstrate improved activity tolerance for community ambulation.     Baseline  6 Minute Walk Test: 200 feet with SPC and supervision. Stopped at 1:43 due to fatigue and leg pain. (11/17/2018); 457 feet SPC, on seated and one standing break (12/15/2018);    Time  12    Period  Weeks    Status  Partially Met    Target Date  02/09/19      PT LONG TERM GOAL #3   Title  Patient will demonstrate improved 5 Times Sit to Stand test to 11 seconds from chair height with no UE support to demonstrate improvement in LE power and functional strength for daily activities.    Baseline  5 Time  Sit to Stand: 27 seconds from chair height no UE support (11/17/2018); 18 seconds, no UE support from chair-high plinth (12/15/2018);    Time  12    Period  Weeks    Status  Partially Met    Target Date  02/09/19      PT LONG TERM GOAL #4   Title  Patient will ambulate faster than 1.34ms on the 10 Meter Walk Test to improve ability to cross street safey for community participation.     Baseline  testing deferred to next session (11/17/2018);  1.37 m/sec with SPC (11/24/2018); 1.3 m/sec with SPC (12/15/2018);    Time  12    Period  Weeks    Status  Achieved    Target Date  02/09/19      PT LONG TERM GOAL #5   Title  Patient will score equal or greater than  25/30 on the Functional Gait Assessment to demonstrate low fall risk to improve her ability to participate safely in community activities.     Baseline  testing deferred to next session (11/17/2018); 11/30 high fall risk (11/24/2018); unable to measure due to time limitation (12/15/2018); 13/30 high fall risk (12/17/2018);    Time  12    Period  Weeks    Status  Partially Met    Target Date  02/09/19            Plan - 01/19/19 1444    Clinical Impression Statement  Patient arrived today feeling a bit weaker than usual. She had more difficulty tolerating sit <> stand exercise and was encouraged to continue with modifications, prompting a discussion about plan of care with clinician and the meaning of pain. Educated patient about wanting to stay aware of pain in order to determine when it is appropriate to continue and how not all pain equals harm. Patient seemed to want to focus a bit more on pain relief. Manual therapy was applied to the right lumbar region resulting in improved muscle tension and pain there. Recommend manual following warm up next session to promote better tolerance of exercise. Patient would benefit from continued physical therapy to address remaining impairments and functional limitations to work towards stated goals and return to PLOF or maximal functional independence.    Personal Factors and Comorbidities  Age;Comorbidity 3+;Fitness;Time since onset of injury/illness/exacerbation;Past/Current Experience;Other    Comorbidities  underwent extensive vascular surgeries in Jan 2020 to improve blood flow, similar surgery for R leg scheduled 12/07/2018. Has a history of left carotid artery stenosis, but not enough to have surgery, has a history of multiple arterial  surgeries, history of R cataract surgery, colon surgery, cholecystectomy, R foot skin graft, ovary surgery, gastric bypass, R foot fusion, abdominal hysterectomy; current smoker, plantar fasciitis, peripheral arterial disease, LVH, diabetes mellitus with neuropathy, irregular heartbeat, hiatal hernia, GERD, stenosis of left heart ventricle, depression, lumbar degenerative disc disease, cardiac murmur, asthma, anxiety, obesity, HTN, and seasonal allergies She has been advised not to lift over 10 pounds by physician.      Examination-Activity Limitations  Bathing;Carry;Lift;Sit;Stand;Locomotion Level;Toileting;Dressing;Squat;Transfers;Stairs;Hygiene/Grooming;Other    Examination-Participation Restrictions  Community Activity;Volunteer;Cleaning;Interpersonal Relationship;Yard Work;Other    Rehab Potential  Fair    Clinical Impairments Affecting Rehab Potential  (+) motivated (-) muliple co morbidities, chronic condition    PT Frequency  2x / week    PT Duration  12 weeks    PT Treatment/Interventions  Moist Heat;Patient/family education;Neuromuscular re-education;Therapeutic exercise;Manual techniques;ADLs/Self Care Home Management;Electrical Stimulation;Gait training;Stair training;Functional mobility training;Therapeutic activities;Balance training;Passive range of  motion;Dry needling;Energy conservation;Spinal Manipulations;Joint Manipulations;Cryotherapy    PT Next Visit Plan  include manual therapy for pain relief. continue with progressive LE and  functional strengtheing; incorporate postural strengthening    PT Home Exercise Plan  Medbridge Access Code: [email or web address]    Consulted and Agree with Plan of Care  Patient       Patient will benefit from skilled therapeutic intervention in order to improve the following deficits and impairments:  Decreased strength, Impaired flexibility, Decreased activity tolerance, Impaired perceived functional ability, Pain, Decreased endurance, Difficulty walking, Abnormal  gait, Decreased knowledge of use of DME, Decreased skin integrity, Decreased range of motion, Impaired sensation, Improper body mechanics, Obesity, Postural dysfunction, Increased edema, Decreased mobility, Decreased balance, Cardiopulmonary status limiting activity, Decreased coordination  Visit Diagnosis: 1. Muscle weakness (generalized)   2. Chronic bilateral low back pain, unspecified whether sciatica present   3. Difficulty in walking, not elsewhere classified   4. History of falling        Problem List Patient Active Problem List   Diagnosis Date Noted  . Lower extremity edema 07/31/2018  . Pressure injury of skin 07/24/2018  . Surgical site infection 07/23/2018  . Wound of left leg 07/23/2018  . Atherosclerosis of artery of extremity with rest pain ([place]) 07/08/2018  . Shortness of breath 06/02/2018  . Bruit 06/02/2018  . Coronary artery disease of native artery of native heart with stable angina pectoris ([place]) 06/02/2018  . Atherosclerosis of native arteries of extremity with intermittent claudication ([place]) 05/19/2018  . Peripheral arterial disease ([place]) 05/06/2018  . Personal history of colonic polyps   . Aortic stenosis 12/24/2017  . LVH (left ventricular hypertrophy) 12/24/2017  . Hypertension with heart disease 12/24/2017  . Left atrial dilatation 12/12/2017  . Cardiac murmur 12/12/2017  . Gastrojejunal ulcer 12/04/2017  . Hyperphosphatemia 12/04/2017  . Spinal stenosis of lumbar region 06/18/2017  . Degeneration of lumbar intervertebral disc 06/02/2017  . Assistance needed with transportation 05/26/2017  . Financial difficulties 05/26/2017  . Needs assistance with community resources 05/26/2017  . Moderate recurrent major depression ([place]) 05/26/2017  . Non-healing wound of lower extremity 02/06/2017  . Status post ankle arthrodesis 12/11/2016  . Controlled substance agreement broken 10/11/2016  . Low back pain 09/25/2016  . Osteoarthritis of right subtalar joint  07/11/2016  . Posterior tibial tendinitis of right lower extremity 07/11/2016  . Breast cancer screening 06/18/2016  . Knee pain 04/03/2016  . Hyponatremia 03/01/2016  . High triglycerides 12/24/2015  . Pes planus of both feet 11/16/2015  . Plantar fasciitis of right foot 11/16/2015  . Heel spur 11/16/2015  . Diabetic neuropathy ([place]) 11/16/2015  . Right ankle pain 11/16/2015  . Medication monitoring encounter 11/16/2015  . Chronic pain of multiple joints 08/15/2015  . Abnormal mammogram of right breast 06/19/2015  . Cerumen impaction 03/16/2015  . Hyperlipidemia 12/14/2014  . History of transfusion of packed RBC 12/14/2014  . Hx of smoking 12/14/2014  . Status post bariatric surgery 12/14/2014  . Morbid obesity ([place]) 12/14/2014  . Chronic radicular lumbar pain 12/14/2014  . History of GI bleed 11/12/2014  . GERD without esophagitis 11/12/2014  . History of small bowel obstruction 09/07/2014    Everlean Alstrom. [name], PT, DPT 01/19/19, 2:50 PM  [facility] [address] [place], [address] Phone: [phone]   Fax:  [phone]  Name: LAVONNE KINDERMAN MRN: [id number] Date of Birth: 03/03/1946

## 2019-01-21 ENCOUNTER — Other Ambulatory Visit: Payer: Self-pay

## 2019-01-21 ENCOUNTER — Encounter: Payer: Self-pay | Admitting: Physical Therapy

## 2019-01-21 ENCOUNTER — Ambulatory Visit: Payer: Medicare Other | Admitting: Physical Therapy

## 2019-01-21 DIAGNOSIS — Z9181 History of falling: Secondary | ICD-10-CM | POA: Diagnosis not present

## 2019-01-21 DIAGNOSIS — M545 Low back pain, unspecified: Secondary | ICD-10-CM

## 2019-01-21 DIAGNOSIS — M5441 Lumbago with sciatica, right side: Secondary | ICD-10-CM | POA: Diagnosis not present

## 2019-01-21 DIAGNOSIS — M62838 Other muscle spasm: Secondary | ICD-10-CM | POA: Diagnosis not present

## 2019-01-21 DIAGNOSIS — G8929 Other chronic pain: Secondary | ICD-10-CM | POA: Diagnosis not present

## 2019-01-21 DIAGNOSIS — M6281 Muscle weakness (generalized): Secondary | ICD-10-CM | POA: Diagnosis not present

## 2019-01-21 DIAGNOSIS — R262 Difficulty in walking, not elsewhere classified: Secondary | ICD-10-CM | POA: Diagnosis not present

## 2019-01-21 DIAGNOSIS — M5440 Lumbago with sciatica, unspecified side: Secondary | ICD-10-CM | POA: Diagnosis not present

## 2019-01-21 NOTE — Therapy (Signed)
[facility] [address] [place], [address] Phone: [phone]   Fax:  [phone]  Physical Therapy Treatment  Patient Details  Name: Andrea Holden MRN: [id number] Date of Birth: 07/03/1945 Referring Provider (PT): Fredderick Severance, NP    Encounter Date: 01/21/2019  PT End of Session - 01/21/19 1538    Visit Number  16    Number of Visits  24    Date for PT Re-Evaluation  02/09/19    Authorization Type  [facility] Medicare reporting period from 12/15/2018    Authorization Time Period  Current cert period 11/25/7822 - 02/09/2019 (latest PN: 12/15/2018)    Authorization - Visit Number  8    Authorization - Number of Visits  10    PT Start Time  1350    PT Stop Time  1430    PT Time Calculation (min)  40 min    Activity Tolerance  Patient limited by fatigue;Patient limited by pain    Behavior During Therapy  [facility] for tasks assessed/performed       Past Medical History:  Diagnosis Date  . Allergy    seasonal allergies  . Anemia 2014   needed 5 units of blood d/t passing out, weak  . Anxiety   . Aortic stenosis 12/24/2017   Echo Aug 2018  . Arthritis   . Asthma    allergy induced asthma  . Cardiac murmur 12/12/2017  . Cataract   . Cataract    left  . Complication of anesthesia    arrhythmia following colonoscopy  . Degenerative disc disease, lumbar   . Degenerative disc disease, lumbar   . Depression   . Diabetes mellitus   . Diabetic neuropathy ([place]) 11/16/2015  . Dysrhythmia    stenosis of left ventricle  . GERD (gastroesophageal reflux disease)   . H/O transfusion    patient was given 5 units of blood while at [place], blood type O+  . History of chicken pox   . History of hiatal hernia   . History of measles, mumps, or rubella   . HOH (hard of hearing)    does not use hearing aides yet  . Hyperlipidemia   . Hypertension   . Irregular heartbeat   . LVH (left ventricular hypertrophy)  12/24/2017   Echo Aug 2018  . Neuropathy   . Opiate use 11/16/2015  . Peripheral arterial disease ([place]) 05/06/2018   At rest, left > right; refer to vasc  . Pes planus of both feet 11/16/2015  . Plantar fasciitis of right foot 11/16/2015  . Wheezing     Past Surgical History:  Procedure Laterality Date  . ABDOMINAL HYSTERECTOMY    . APPLICATION OF WOUND VAC Left 07/23/2018   Procedure: APPLICATION OF WOUND VAC;  Surgeon: Algernon Huxley, MD;  Location: [facility] ORS;  Service: Vascular;  Laterality: Left;  . banck injections    . CATARACT EXTRACTION W/PHACO Right 05/30/2015   Procedure: CATARACT EXTRACTION PHACO AND INTRAOCULAR LENS PLACEMENT (IOC);  Surgeon: Birder Robson, MD;  Location: [facility] ORS;  Service: Ophthalmology;  Laterality: Right;  [place] 00:57 AP% 20.9 CDE 11.99 fluid pack lot #[id number]  . CHOLECYSTECTOMY  1970  . COLON SURGERY  2013   blocked colon  . COLONOSCOPY WITH PROPOFOL N/A 01/20/2018   Procedure: COLONOSCOPY WITH PROPOFOL;  Surgeon: Lucilla Lame, MD;  Location: [facility] ENDOSCOPY;  Service: Endoscopy;  Laterality: N/A;  . ENDARTERECTOMY FEMORAL Left 07/08/2018   Procedure: ENDARTERECTOMY  FEMORAL;  Surgeon: Algernon Huxley, MD;  Location: [facility] ORS;  Service: Vascular;  Laterality: Left;  . EYE SURGERY Right    cataract surgery  . FOOT FUSION Right 2018   metal in foot  . GALLBLADDER SURGERY  1970  . GASTRIC BYPASS  2010   lost 178 lbs and regained 40 lbs last few years  . HUMERUS FRACTURE SURGERY Right    metal plate with screws  . internal bleeding  2016   ulcer in past  . LOWER EXTREMITY ANGIOGRAM Left 07/08/2018   Procedure: LOWER EXTREMITY ANGIOGRAM;  Surgeon: Algernon Huxley, MD;  Location: [facility] ORS;  Service: Vascular;  Laterality: Left;  . LOWER EXTREMITY ANGIOGRAPHY Left 05/27/2018   Procedure: LOWER EXTREMITY ANGIOGRAPHY;  Surgeon: Algernon Huxley, MD;  Location: [place] [facility];  Service: Cardiovascular;  Laterality: Left;  . LOWER EXTREMITY ANGIOGRAPHY Right 12/07/2018    Procedure: LOWER EXTREMITY ANGIOGRAPHY;  Surgeon: Algernon Huxley, MD;  Location: [place] [facility];  Service: Cardiovascular;  Laterality: Right;  . MOUTH SURGERY     root canals and crowns and extractions  . OVARY SURGERY    . SKIN GRAFT Right 2018   RT foot. foot has been rebuilt.  it is full of metal  . TENOTOMY ACHILLES TENDON Right    Percuntaneous. metal in foot  . TONSILLECTOMY    . WOUND DEBRIDEMENT Left 07/23/2018   Procedure: DEBRIDEMENT WOUND;  Surgeon: Algernon Huxley, MD;  Location: [facility] ORS;  Service: Vascular;  Laterality: Left;    There were no vitals filed for this visit.  Subjective Assessment - 01/21/19 1534    Subjective  Patient reports she is feeling okay. Her right side is aching a lot. Her R knee has been buckling a lot and she is not sure why. She has had to grab something each time. She has not actually fallen since last time. She has a walker but feel like she walks even worse. She doesn't want to use a walker. She feel she can catch herself on her SPC. Pain while walking is 5-6/10, sitting it is a 4.5-5/10. Denies any paresthesia. Pain goes about  down her buttocks. Knee is not that painful, but it mostly just buckles.  States the manual therapy helped decrease her pain until about Wednesday afternoon (about 24 hours).    Pertinent History  Patient is a 73 y.o. female who presents to outpatient physical therapy with a referral for medical diagnosis of degeneration of lumbar intervertebral disc. This patient's chief complaints consist of chronic low back pain and foot pain, weakness, stiffness, leading to the following functional deficits: difficulty with with basic ADLs, IADLs, ambulation. She used to get injections to help control back pain but it has recently worsened due to being unable to get injections during [place] pandemic. The patient has been informed of current processes in place at [facility]patient [facility] to protect patients from Covid-19 exposure including social  distancing, schedule modifications, and new cleaning procedures. After discussing their particular risk with a therapist based on the patient's personal risk factors, the patient has decided to proceed with in-person therapy. Pt is scheduled for R LE angiography on 12/07/2018. She has recently started smoking again with the stress of COVID19 pandemic and feels strongly she needs to stop. Surgeon has stated in the past he will not complete surgery if she is smoking.  Relevant past medical history and comorbidities include underwent extensive vascular surgeries in Jan 2020 to improve blood flow, similar  surgery for R leg scheduled 12/07/2018. Has a history of left carotid artery stenosis, but not enough to have surgery, has a history of multiple arterial surgeries, history of R cataract surgery, colon surgery, cholecystectomy, R foot skin graft, ovary surgery, gastric bypass, R foot fusion, abdominal hysterectomy; current smoker, plantar fasciitis, peripheral arterial disease, LVH, diabetes mellitus with neuropathy, irregular heartbeat, hiatal hernia, GERD, stenosis of left heart ventricle, depression, lumbar degenerative disc disease, cardiac murmur, asthma, anxiety, obesity, HTN, and seasonal allergies She has been advised not to lift over 10 pounds by physician    Limitations  Sitting;Standing;Walking;House hold activities;Lifting    How long can you sit comfortably?  <1 hour    How long can you stand comfortably?  1 minute    How long can you walk comfortably?  2 minutes, depends on what she has with her. Able to walk at fair with rollator. To mailbox 120 feet causes shaking in R leg.      Diagnostic tests  MRI lumbar spine report 06/14/2017: "IMPRESSION: 1. Stable degenerative of lumbar spondylosis and scoliosis with multilevel disc disease and facet disease. 2. Stable right foraminal stenosis at L5-S1 due to right-sided disc    Currently in Pain?  Yes    Pain Score  5     Pain Location  Back    Pain  Orientation  Left;Right;Mid;Lower    Pain Descriptors / Indicators  Aching    Pain Onset  More than a month ago        TREATMENT:  Manual therapy: to reduce pain and tissue tension, improve range of motion, neuromodulation, in order to promote improved ability to complete functional activities. - prone with head supported on blue bolster/head girdle: STM to R mid to low back and bilateral low back and L glute region to decrease pain and tension. Patient quite painful to palpation and educated to continue breathing and trying to allow body to relax as if she was not feeling pain. Patient reported significant deccrease in pain.   Therapeutic exercise: to centralize symptoms and improve ROM, strength, muscular endurance, and activity tolerance required for successful completion of functional activities.  - sit <> supine and rolling with min A to get to and from prone position for exercise and manual.  - Prone hip extension with knee flexed to preferentially bias gluteus maximus. 2x10 Cuing to keep knee flexed. Assistance to keep R knee flexed (stiff). - prone shoulder extension with scapular retraction, depression, and posterior tilt to improve back and postural strength. 2x10 with tactile cuing to decrease overuse of B UTs.  - prone isometric hip IR against green ball between feet, 5 second holds, 2x10. Patient required assistance to keep knees flexed and not hold ball. To improve hip strength for functional activity and pain relief. Visible glute contraction present.  - prone B shoulder horizontal abduction with scapular retraction and depression to improve back and postural strength. 2x10. Tactile and verbal cuing for correct execution.  - pelvis rotation side to side to relax low back and hip musculature between all sets of hip exercises, for patient comfort.   HOME EXERCISE PROGRAM Access Code: [email or web address]  URL: [email or web address]  Date: 12/03/2018  Prepared by: Rosita Kea    Exercises   Sit to Stand without Arm Support - 30 reps - 2x daily   Standing Hip Hiking - 3 sets - 10 reps - 2x daily   Supine Bridge with Resistance Band - 3 sets - 10 reps - 1 second hold -  2x daily   Sidelying Hip Abduction - 3 sets - 10 reps - 2x daily   Prone Hip Extension with Bent Knee - 3 sets - 10 reps - 2x daily   Prone Hip Abduction on Slider - 3 sets - 10 reps - 2x daily  Breathing exercise x 5 min    PT Education - 01/21/19 1538    Education provided  Yes    Education Details  exercise form/technique, self management techniques    Person(s) Educated  Patient    Methods  Explanation;Demonstration;Tactile cues;Verbal cues    Comprehension  Verbalized understanding;Returned demonstration;Verbal cues required;Tactile cues required       PT Short Term Goals - 01/05/19 1738      PT SHORT TERM GOAL #1   Title  Be independent with initial home exercise program for self-management of symptoms.    Baseline  Initial HEP provided at [facility] (11/17/2018); was completing until vascular procedure, re-instated 12/15/2018 (12/15/2018);    Time  2    Period  Weeks    Status  Partially Met    Target Date  01/19/19        PT Long Term Goals - 12/17/18 1422      PT LONG TERM GOAL #1   Title  Be independent with a long-term home exercise program for self-management of symptoms.     Baseline  Initial HEP provided at [facility] (11/17/2018); was doing better before vascular surgery, is working at returning to [facility] since procedure (12/15/2018).    Time  12    Period  Weeks    Status  Partially Met    Target Date  02/09/19      PT LONG TERM GOAL #2   Title  Patient will improve 6MWT to equal or greater than 1000 feet with LRAD to demonstrate improved activity tolerance for community ambulation.     Baseline  6 Minute Walk Test: 200 feet with SPC and supervision. Stopped at 1:43 due to fatigue and leg pain. (11/17/2018); 457 feet SPC, on seated and one standing break (12/15/2018);    Time  12     Period  Weeks    Status  Partially Met    Target Date  02/09/19      PT LONG TERM GOAL #3   Title  Patient will demonstrate improved 5 Times Sit to Stand test to 11 seconds from chair height with no UE support to demonstrate improvement in LE power and functional strength for daily activities.    Baseline  5 Time Sit to Stand: 27 seconds from chair height no UE support (11/17/2018); 18 seconds, no UE support from chair-high plinth (12/15/2018);    Time  12    Period  Weeks    Status  Partially Met    Target Date  02/09/19      PT LONG TERM GOAL #4   Title  Patient will ambulate faster than 1.73ms on the 10 Meter Walk Test to improve ability to cross street safey for community participation.     Baseline  testing deferred to next session (11/17/2018); 1.37 m/sec with SPC (11/24/2018); 1.3 m/sec with SPC (12/15/2018);    Time  12    Period  Weeks    Status  Achieved    Target Date  02/09/19      PT LONG TERM GOAL #5   Title  Patient will score equal or greater than  25/30 on the Functional Gait Assessment to demonstrate low fall risk to improve  her ability to participate safely in community activities.     Baseline  testing deferred to next session (11/17/2018); 11/30 high fall risk (11/24/2018); unable to measure due to time limitation (12/15/2018); 13/30 high fall risk (12/17/2018);    Time  12    Period  Weeks    Status  Partially Met    Target Date  02/09/19            Plan - 01/21/19 1551    Clinical Impression Statement  Patient tolerated exercises well and reported decrease in pain from 5/10 to 3/10 by end of session. States she feels she gets a lot out of mat exercises. Patient demo decreased hip IR and ER control but was able to complete exercises with assistance from clinician. Patient with tight and tender muscles throughout low back and upper glutes. Patient would benefit from continued physical therapy to address remaining impairments and functional limitations to work  towards stated goals and return to PLOF or maximal functional independence.    Personal Factors and Comorbidities  Age;Comorbidity 3+;Fitness;Time since onset of injury/illness/exacerbation;Past/Current Experience;Other    Comorbidities  underwent extensive vascular surgeries in Jan 2020 to improve blood flow, similar surgery for R leg scheduled 12/07/2018. Has a history of left carotid artery stenosis, but not enough to have surgery, has a history of multiple arterial surgeries, history of R cataract surgery, colon surgery, cholecystectomy, R foot skin graft, ovary surgery, gastric bypass, R foot fusion, abdominal hysterectomy; current smoker, plantar fasciitis, peripheral arterial disease, LVH, diabetes mellitus with neuropathy, irregular heartbeat, hiatal hernia, GERD, stenosis of left heart ventricle, depression, lumbar degenerative disc disease, cardiac murmur, asthma, anxiety, obesity, HTN, and seasonal allergies She has been advised not to lift over 10 pounds by physician.      Examination-Activity Limitations  Bathing;Carry;Lift;Sit;Stand;Locomotion Level;Toileting;Dressing;Squat;Transfers;Stairs;Hygiene/Grooming;Other    Examination-Participation Restrictions  Community Activity;Volunteer;Cleaning;Interpersonal Relationship;Yard Work;Other    Rehab Potential  Fair    Clinical Impairments Affecting Rehab Potential  (+) motivated (-) muliple co morbidities, chronic condition    PT Frequency  2x / week    PT Duration  12 weeks    PT Treatment/Interventions  Moist Heat;Patient/family education;Neuromuscular re-education;Therapeutic exercise;Manual techniques;ADLs/Self Care Home Management;Electrical Stimulation;Gait training;Stair training;Functional mobility training;Therapeutic activities;Balance training;Passive range of motion;Dry needling;Energy conservation;Spinal Manipulations;Joint Manipulations;Cryotherapy    PT Next Visit Plan  include manual therapy for pain relief. continue with progressive  LE and  functional strengtheing; incorporate postural strengthening    PT Home Exercise Plan  Medbridge Access Code: [email or web address]    Consulted and Agree with Plan of Care  Patient       Patient will benefit from skilled therapeutic intervention in order to improve the following deficits and impairments:  Decreased strength, Impaired flexibility, Decreased activity tolerance, Impaired perceived functional ability, Pain, Decreased endurance, Difficulty walking, Abnormal gait, Decreased knowledge of use of DME, Decreased skin integrity, Decreased range of motion, Impaired sensation, Improper body mechanics, Obesity, Postural dysfunction, Increased edema, Decreased mobility, Decreased balance, Cardiopulmonary status limiting activity, Decreased coordination  Visit Diagnosis: 1. Muscle weakness (generalized)   2. Chronic bilateral low back pain, unspecified whether sciatica present   3. Difficulty in walking, not elsewhere classified   4. History of falling        Problem List Patient Active Problem List   Diagnosis Date Noted  . Lower extremity edema 07/31/2018  . Pressure injury of skin 07/24/2018  . Surgical site infection 07/23/2018  . Wound of left leg 07/23/2018  . Atherosclerosis of artery of extremity with  rest pain ([place]) 07/08/2018  . Shortness of breath 06/02/2018  . Bruit 06/02/2018  . Coronary artery disease of native artery of native heart with stable angina pectoris ([place]) 06/02/2018  . Atherosclerosis of native arteries of extremity with intermittent claudication ([place]) 05/19/2018  . Peripheral arterial disease ([place]) 05/06/2018  . Personal history of colonic polyps   . Aortic stenosis 12/24/2017  . LVH (left ventricular hypertrophy) 12/24/2017  . Hypertension with heart disease 12/24/2017  . Left atrial dilatation 12/12/2017  . Cardiac murmur 12/12/2017  . Gastrojejunal ulcer 12/04/2017  . Hyperphosphatemia 12/04/2017  . Spinal stenosis of lumbar region 06/18/2017  .  Degeneration of lumbar intervertebral disc 06/02/2017  . Assistance needed with transportation 05/26/2017  . Financial difficulties 05/26/2017  . Needs assistance with community resources 05/26/2017  . Moderate recurrent major depression ([place]) 05/26/2017  . Non-healing wound of lower extremity 02/06/2017  . Status post ankle arthrodesis 12/11/2016  . Controlled substance agreement broken 10/11/2016  . Low back pain 09/25/2016  . Osteoarthritis of right subtalar joint 07/11/2016  . Posterior tibial tendinitis of right lower extremity 07/11/2016  . Breast cancer screening 06/18/2016  . Knee pain 04/03/2016  . Hyponatremia 03/01/2016  . High triglycerides 12/24/2015  . Pes planus of both feet 11/16/2015  . Plantar fasciitis of right foot 11/16/2015  . Heel spur 11/16/2015  . Diabetic neuropathy ([place]) 11/16/2015  . Right ankle pain 11/16/2015  . Medication monitoring encounter 11/16/2015  . Chronic pain of multiple joints 08/15/2015  . Abnormal mammogram of right breast 06/19/2015  . Cerumen impaction 03/16/2015  . Hyperlipidemia 12/14/2014  . History of transfusion of packed RBC 12/14/2014  . Hx of smoking 12/14/2014  . Status post bariatric surgery 12/14/2014  . Morbid obesity ([place]) 12/14/2014  . Chronic radicular lumbar pain 12/14/2014  . History of GI bleed 11/12/2014  . GERD without esophagitis 11/12/2014  . History of small bowel obstruction 09/07/2014    Everlean Alstrom. [name], PT, DPT 01/21/19, 3:52 PM  [facility] [address], [place], [address] Phone: [phone]   Fax:  [phone]  Name: Andrea Holden MRN: [id number] Date of Birth: [birth year]

## 2019-01-26 ENCOUNTER — Other Ambulatory Visit: Payer: Self-pay

## 2019-01-26 ENCOUNTER — Ambulatory Visit: Payer: Medicare Other | Admitting: Physical Therapy

## 2019-01-26 ENCOUNTER — Encounter: Payer: Self-pay | Admitting: Physical Therapy

## 2019-01-26 DIAGNOSIS — M6281 Muscle weakness (generalized): Secondary | ICD-10-CM

## 2019-01-26 DIAGNOSIS — Z9181 History of falling: Secondary | ICD-10-CM | POA: Diagnosis not present

## 2019-01-26 DIAGNOSIS — M5441 Lumbago with sciatica, right side: Secondary | ICD-10-CM | POA: Diagnosis not present

## 2019-01-26 DIAGNOSIS — R262 Difficulty in walking, not elsewhere classified: Secondary | ICD-10-CM

## 2019-01-26 DIAGNOSIS — M62838 Other muscle spasm: Secondary | ICD-10-CM | POA: Diagnosis not present

## 2019-01-26 DIAGNOSIS — M5440 Lumbago with sciatica, unspecified side: Secondary | ICD-10-CM | POA: Diagnosis not present

## 2019-01-26 DIAGNOSIS — G8929 Other chronic pain: Secondary | ICD-10-CM

## 2019-01-26 DIAGNOSIS — M545 Low back pain: Secondary | ICD-10-CM | POA: Diagnosis not present

## 2019-01-26 NOTE — Therapy (Signed)
[facility] [address] [place], [address] Phone: [phone]   Fax:  [phone]  Physical Therapy Treatment / Progress Note / Re-Certification Reporting Period: 12/15/2018 - 01/26/2019   Patient Details  Name: Andrea Holden MRN: [id number] Date of Birth: 07/15/1945 Referring Provider (PT): Fredderick Severance, NP    Encounter Date: 01/26/2019  PT End of Session - 01/26/19 1343    Visit Number  17    Number of Visits  33    Date for PT Re-Evaluation  04/20/19    Authorization Type  [facility] Medicare reporting period from 12/15/2018 (new reporting period from 01/26/2019)    Authorization Time Period  Current cert period 1/69/6789 - 03/23/2019 (latest PN: 01/26/2019)    Authorization - Visit Number  10    Authorization - Number of Visits  10    PT Start Time  1345    PT Stop Time  1430    PT Time Calculation (min)  45 min    Activity Tolerance  Patient limited by fatigue;Patient limited by pain    Behavior During Therapy  [facility] for tasks assessed/performed       Past Medical History:  Diagnosis Date  . Allergy    seasonal allergies  . Anemia 2014   needed 5 units of blood d/t passing out, weak  . Anxiety   . Aortic stenosis 12/24/2017   Echo Aug 2018  . Arthritis   . Asthma    allergy induced asthma  . Cardiac murmur 12/12/2017  . Cataract   . Cataract    left  . Complication of anesthesia    arrhythmia following colonoscopy  . Degenerative disc disease, lumbar   . Degenerative disc disease, lumbar   . Depression   . Diabetes mellitus   . Diabetic neuropathy ([place]) 11/16/2015  . Dysrhythmia    stenosis of left ventricle  . GERD (gastroesophageal reflux disease)   . H/O transfusion    patient was given 5 units of blood while at [place], blood type O+  . History of chicken pox   . History of hiatal hernia   . History of measles, mumps, or rubella   . HOH (hard of hearing)    does not use  hearing aides yet  . Hyperlipidemia   . Hypertension   . Irregular heartbeat   . LVH (left ventricular hypertrophy) 12/24/2017   Echo Aug 2018  . Neuropathy   . Opiate use 11/16/2015  . Peripheral arterial disease ([place]) 05/06/2018   At rest, left > right; refer to vasc  . Pes planus of both feet 11/16/2015  . Plantar fasciitis of right foot 11/16/2015  . Wheezing     Past Surgical History:  Procedure Laterality Date  . ABDOMINAL HYSTERECTOMY    . APPLICATION OF WOUND VAC Left 07/23/2018   Procedure: APPLICATION OF WOUND VAC;  Surgeon: Algernon Huxley, MD;  Location: [facility] ORS;  Service: Vascular;  Laterality: Left;  . banck injections    . CATARACT EXTRACTION W/PHACO Right 05/30/2015   Procedure: CATARACT EXTRACTION PHACO AND INTRAOCULAR LENS PLACEMENT (IOC);  Surgeon: Birder Robson, MD;  Location: [facility] OR[facility];  Service: Ophthalmology;  Laterality: Right;  [place] 00:57 AP% 20.9 CDE 11.99 fluid pack lot #[id number]  . CHOLECYSTECTOMY  1970  . COLON SURGERY  2013   blocked colon  . COLONOSCOPY WITH PROPOFOL N/A 01/20/2018   Procedure: COLONOSCOPY WITH PROPOFOL;  Surgeon: Lucilla Lame, MD;  Location: [facility] ENDOS[facility];  Service: Endoscopy;  Laterality: N/A;  . ENDARTERECTOMY FEMORAL Left 07/08/2018   Procedure: ENDARTERECTOMY FEMORAL;  Surgeon: Algernon Huxley, MD;  Location: [facility] OR[facility];  Service: Vascular;  Laterality: Left;  . EYE SURGERY Right    cataract surgery  . FOOT FUSION Right 2018   metal in foot  . GALLBLADDER SURGERY  1970  . GASTRIC BYPASS  2010   lost 178 lbs and regained 40 lbs last few years  . HUMERUS FRACTURE SURGERY Right    metal plate with screws  . internal bleeding  2016   ulcer in past  . LOWER EXTREMITY ANGIOGRAM Left 07/08/2018   Procedure: LOWER EXTREMITY ANGIOGRAM;  Surgeon: Algernon Huxley, MD;  Location: [facility] OR[facility];  Service: Vascular;  Laterality: Left;  . LOWER EXTREMITY ANGIOGRAPHY Left 05/27/2018   Procedure: LOWER EXTREMITY ANGIOGRAPHY;  Surgeon: Algernon Huxley, MD;   Location: [facility];  Service: Cardiovascular;  Laterality: Left;  . LOWER EXTREMITY ANGIOGRAPHY Right 12/07/2018   Procedure: LOWER EXTREMITY ANGIOGRAPHY;  Surgeon: Algernon Huxley, MD;  Location: [facility];  Service: Cardiovascular;  Laterality: Right;  . MOUTH SURGERY     root canals and crowns and extractions  . OVARY SURGERY    . SKIN GRAFT Right 2018   RT foot. foot has been rebuilt.  it is full of metal  . TENOTOMY ACHILLES TENDON Right    Percuntaneous. metal in foot  . TONSILLECTOMY    . WOUND DEBRIDEMENT Left 07/23/2018   Procedure: DEBRIDEMENT WOUND;  Surgeon: Algernon Huxley, MD;  Location: [facility];  Service: Vascular;  Laterality: Left;    There were no vitals filed for this visit.  Subjective Assessment - 01/26/19 1351    Subjective  Patient reports her pain is a bit better today. She is still having difficulty with her R knee buckling. She feels the pain is about 4/10 in her bilateral low back, flanks, and scapular region. She reports she felt better following last treatment session. Patient reports that the highest pain she has had in the last 2 weeks is 8/10. Patient reports she feels PT is helping by strengthening her and giving her more confidence. It is making her aware of what she has to do to not fall down. Walking straighter has been getting easier, but she still fatigues quickly and this gets harder when fatigued. States the bed exercises are helping a lot and it is difficult for her to do at home because the bed is soft. She is improving in her HEP routine.    Pertinent History  Patient is a 73 y.o. female who presents to outpatient physical therapy with a referral for medical diagnosis of degeneration of lumbar intervertebral disc. This patient's chief complaints consist of chronic low back pain and foot pain, weakness, stiffness, leading to the following functional deficits: difficulty with with basic ADLs, IADLs, ambulation. She used to get injections to  help control back pain but it has recently worsened due to being unable to get injections during [place] pandemic. The patient has been informed of current processes in place at [facility]patient [facility]hab to protect patients from Covid-19 exposure including social distancing, schedule modifications, and new cleaning procedures. After discussing their particular risk with a therapist based on the patient's personal risk factors, the patient has decided to proceed with in-person therapy. Pt is scheduled for R LE angiography on 12/07/2018. She has recently started smoking again with the stress of COVID19 pandemic and feels strongly she needs to  stop. Surgeon has stated in the past he will not complete surgery if she is smoking.  Relevant past medical history and comorbidities include underwent extensive vascular surgeries in Jan 2020 to improve blood flow, similar surgery for R leg scheduled 12/07/2018. Has a history of left carotid artery stenosis, but not enough to have surgery, has a history of multiple arterial surgeries, history of R cataract surgery, colon surgery, cholecystectomy, R foot skin graft, ovary surgery, gastric bypass, R foot fusion, abdominal hysterectomy; current smoker, plantar fasciitis, peripheral arterial disease, LVH, diabetes mellitus with neuropathy, irregular heartbeat, hiatal hernia, GERD, stenosis of left heart ventricle, depression, lumbar degenerative disc disease, cardiac murmur, asthma, anxiety, obesity, HTN, and seasonal allergies She has been advised not to lift over 10 pounds by physician    Limitations  Sitting;Standing;Walking;House hold activities;Lifting    How long can you sit comfortably?  > 1 hour in almost any chair    How long can you stand comfortably?  4-5 minutes    How long can you walk comfortably?  2 minutes, depends on what she has with her. Able to walk at fair with rollator. To mailbox 120 feet causes shaking in R leg.      Diagnostic tests  MRI lumbar spine report  06/14/2017: "IMPRESSION: 1. Stable degenerative of lumbar spondylosis and scoliosis with multilevel disc disease and facet disease. 2. Stable right foraminal stenosis at L5-S1 due to right-sided disc    Currently in Pain?  Yes    Pain Score  4     Pain Location  Back   B low back, B flanks, B scapulae   Pain Orientation  Right;Left;Mid;Lower    Pain Descriptors / Indicators  Aching    Pain Type  Chronic pain    Pain Radiating Towards  bilateral hips    Pain Onset  More than a month ago    Aggravating Factors   feels like when walking legs are losing oxygen, walking, stairs, standing, bending, liftin    Pain Relieving Factors  oxycodone, sitting    Effect of Pain on Daily Activities  difficulty with all daily tasks involving standing, walking, sitting, unable to show dogs, difficulty moving chairs at dog shows, getting down on one knee, walk outside, walk dogs, unable to run or position dog         [facility] PT Assessment - 01/26/19 0001      Assessment   Medical Diagnosis  Degeneration of lumbar intervertebral disc    Referring Provider (PT)  P[place], Bethel Born, NP     Hand Dominance  Right    Prior Therapy  participated several times with improved function.       Precautions   Precautions  Other (comment)   no lifting over 10#     Restrictions   Weight Bearing Restrictions  No      Home Environment   Living Environment  Private residence    [place]   with two dogs (Shit tzu and Magazine features editor).    Type of [place] to enter    Entrance Stairs-Number of Steps  3    Entrance Stairs-Rails  Right;Left;Can reach both    Home Layout  One level    Home Equipment  Tub bench;Bedside commode;Other (comment);Cane - single point;Walker - 2 wheels;Grab bars - tub/shower;Grab bars - toilet   rollator     Prior Function   Level of Independence  Independent  Vocation  Part time employment   currently closed due to [place] 19 pandemic    Vocation Requirements  substitute teaching    Leisure  life revolves around dog showing (comfirmation and obedience), currently unable to do       Cognition   Overall Cognitive Status  Within Functional Limits for tasks assessed      Observation/Other Assessments   Observations  see notes from 01/26/2019 for latest objective data    Focus on Therapeutic Outcomes (FOTO)   44    Oswestry Disability Index   38%   mODI     6 minute walk test results    Aerobic Endurance Distance Walked  547   SPC, 2 seated and one standing break     Standardized Balance Assessment   Five times sit to stand comments   16 seconds, no UE support from chair-high plinth    10 Meter Walk  1.3 m/s with [facility]      Functional Gait  Assessment   Gait Level Surface  Walks 20 ft, slow speed, abnormal gait pattern, evidence for imbalance or deviates 10-15 in outside of the 12 in walkway width. Requires more than 7 sec to ambulate 20 ft.    Change in Gait Speed  Able to change speed, demonstrates mild gait deviations, deviates 6-10 in outside of the 12 in walkway width, or no gait deviations, unable to achieve a major change in velocity, or uses a change in velocity, or uses an assistive device.    Gait with Horizontal Head Turns  Performs head turns smoothly with slight change in gait velocity (eg, minor disruption to smooth gait path), deviates 6-10 in outside 12 in walkway width, or uses an assistive device.    Gait with Vertical Head Turns  Performs task with slight change in gait velocity (eg, minor disruption to smooth gait path), deviates 6 - 10 in outside 12 in walkway width or uses assistive device    Gait and Pivot Turn  Pivot turns safely in greater than 3 sec and stops with no loss of balance, or pivot turns safely within 3 sec and stops with mild imbalance, requires small steps to catch balance.    Step Over Obstacle  Is able to step over one shoe box (4.5 in total height) but must slow down and adjust steps to  clear box safely. May require verbal cueing.    Gait with Narrow Base of Support  Ambulates less than 4 steps heel to toe or cannot perform without assistance.    Gait with Eyes Closed  Cannot walk 20 ft without assistance, severe gait deviations or imbalance, deviates greater than 15 in outside 12 in walkway width or will not attempt task.    Ambulating Backwards  Walks 20 ft, uses assistive device, slower speed, mild gait deviations, deviates 6-10 in outside 12 in walkway width.    Steps  Alternating feet, must use rail.    Total Score  14    FGA comment:  < 19 = high risk fall   last measured 01/26/2019, used SPC and SBA-CGA for safety          See above for objective data    TREATMENT:  Therapeutic exercise:to centralize symptoms and improve ROM, strength, muscular endurance, and activity tolerance required for successful completion of functional activities.  Filling out functional outcome measures (mODI and FOTO; 15 min unbilled).  Functional testing to assess progress and improve functional endurance, strength, balance, and power to improve  ability to complete ADLs. Required SBA- CGA for safety. Required cuing for directions and encouragement to continue.  - 6MWT: 547 feet SPC, 2 seated and one standing break - 5TSTS: 16 seconds, no UE support from chair-high plinth - 10MWT = 1.3 m/s with SPC   - FGA: 14/30  Education on diagnosis, prognosis, POC, anatomy and physiology of current condition.   Patient required frequent extended seated rest breaks due to [place] and redirection to stay on task due to easy distraction. Required SBA - CGA or standing activities due to fall risk.    PT Education - 01/26/19 1609    Education provided  Yes    Education Details  Exercise purpose/form. Self management techniques. Education on diagnosis, prognosis, POC, anatomy and physiology of current condition Education on HEP including handout    Person(s) Educated  Patient    Methods   Explanation;Demonstration;Verbal cues;Tactile cues    Comprehension  Verbalized understanding;Returned demonstration;Verbal cues required       PT Short Term Goals - 01/26/19 1615      PT SHORT TERM GOAL #1   Title  Be independent with initial home exercise program for self-management of symptoms.    Baseline  Initial HEP provided at IE (11/17/2018); was completing until vascular procedure, re-instated 12/15/2018 (12/15/2018); currently participating (01/26/2019)    Time  2    Period  Weeks    Status  Achieved    Target Date  01/19/19        PT Long Term Goals - 01/26/19 1616      PT LONG TERM GOAL #1   Title  Be independent with a long-term home exercise program for self-management of symptoms.     Baseline  Initial HEP provided at IE (11/17/2018); was doing better before vascular surgery, is working at returning to [facility] since procedure (12/15/2018); currently participating but not assigned final long term HEP yet (01/26/2019);    Time  12    Period  Weeks    Status  Partially Met    Target Date  04/20/19      PT LONG TERM GOAL #2   Title  Patient will improve 6MWT to equal or greater than 1000 feet with LRAD to demonstrate improved activity tolerance for community ambulation.     Baseline  6 Minute Walk Test: 200 feet with SPC and supervision. Stopped at 1:43 due to fatigue and leg pain. (11/17/2018); 457 feet SPC, on seated and one standing break (12/15/2018); 547 with SPC, 2 seated and one standing rest break (01/26/2019);    Time  12    Period  Weeks    Status  Partially Met    Target Date  04/20/19      PT LONG TERM GOAL #3   Title  Patient will demonstrate improved 5 Times Sit to Stand test to 11 seconds from chair height with no UE support to demonstrate improvement in LE power and functional strength for daily activities.    Baseline  5 Time Sit to Stand: 27 seconds from chair height no UE support (11/17/2018); 18 seconds, no UE support from chair-high plinth (12/15/2018); 16  seconds with no UE support from chair (01/26/2019);    Time  12    Period  Weeks    Status  Partially Met    Target Date  04/20/19      PT LONG TERM GOAL #4   Title  Patient will ambulate faster than 1.11ms on the 10 Meter Walk Test to improve ability  to cross street safey for community participation.     Baseline  testing deferred to next session (11/17/2018); 1.37 m/sec with SPC (11/24/2018); 1.3 m/sec with SPC (12/15/2018; 01/26/2019);    Time  12    Period  Weeks    Status  Achieved    Target Date  04/20/19      PT LONG TERM GOAL #5   Title  Patient will score equal or greater than  25/30 on the Functional Gait Assessment to demonstrate low fall risk to improve her ability to participate safely in community activities.     Baseline  testing deferred to next session (11/17/2018); 11/30 high fall risk (11/24/2018); unable to measure due to time limitation (12/15/2018); 13/30 high fall risk (12/17/2018); 14/30 high fall risk (01/26/2019);    Time  12    Period  Weeks    Status  Partially Met    Target Date  04/20/19      Additional Long Term Goals   Additional Long Term Goals  Yes      PT LONG TERM GOAL #6   Title  Reduce pain to equal or less than 4/10 with functional activities to allow patient to complete valued functional tasks such as caring for dogs, grocery shopping, walking with less difficulty.    Baseline  8/10 (01/26/2019);    Time  12    Period  Weeks    Status  New    Target Date  04/20/19        Plan - 01/26/19 1627    Clinical Impression Statement  Patient has attended 93 skilled physical therapy treatment sessions this episode of care and overall continues to make progress towards stated goals. Subjectively, patient reports feeling stronger, more aware of how to prevent falls, more confident, and like she is walking straighter. Objectively, patient demonstrates improvements in walking distance (6MWT), LE strength and power (5TSTS), and balance (FGA). Focus of current  treatment sessions has been improved LE and trunk strength and endurance, improved activity tolerance, reduced fall risk, and pain control. Patient has experienced interruptions in care due to clinic closure around [place] precautions as well as vascular procedures and intermittent success with smoking cessation. Patient continues to present with with significant strength, ROM, balance, pain, activity tolerance, high fall risk impairments that are limiting ability to complete her usual ADLs, IADLs, community participation, care for her dogs, manage her home, bend, lift, ambulate community distances and complete transfers without difficulty or falling.  Patient will benefit from continued skilled physical therapy intervention to address current body structure impairments and activity limitations to improve function and work towards goals set in current POC in order to return to prior level of function or maximal functional improvement.    Personal Factors and Comorbidities  Age;Comorbidity 3+;Fitness;Time since onset of injury/illness/exacerbation;Past/Current Experience;Other    Comorbidities  underwent extensive vascular surgeries in Jan 2020 to improve blood flow, similar surgery for R leg scheduled 12/07/2018. Has a history of left carotid artery stenosis, but not enough to have surgery, has a history of multiple arterial surgeries, history of R cataract surgery, colon surgery, cholecystectomy, R foot skin graft, ovary surgery, gastric bypass, R foot fusion, abdominal hysterectomy; current smoker, plantar fasciitis, peripheral arterial disease, LVH, diabetes mellitus with neuropathy, irregular heartbeat, hiatal hernia, GERD, stenosis of left heart ventricle, depression, lumbar degenerative disc disease, cardiac murmur, asthma, anxiety, obesity, HTN, and seasonal allergies She has been advised not to lift over 10 pounds by physician.  Examination-Activity Limitations  Bathing;Carry;Lift;Sit;Stand;Locomotion  Level;Toileting;Dressing;Squat;Transfers;Stairs;Hygiene/Grooming;Other    Examination-Participation Restrictions  Community Activity;Volunteer;Cleaning;Interpersonal Relationship;Yard Work;Other    Rehab Potential  Fair    Clinical Impairments Affecting Rehab Potential  (+) motivated (-) muliple co morbidities, chronic condition    PT Frequency  2x / week    PT Duration  12 weeks    PT Treatment/Interventions  Moist Heat;Patient/family education;Neuromuscular re-education;Therapeutic exercise;Manual techniques;ADLs/Self Care Home Management;Electrical Stimulation;Gait training;Stair training;Functional mobility training;Therapeutic activities;Balance training;Passive range of motion;Dry needling;Energy conservation;Spinal Manipulations;Joint Manipulations;Cryotherapy    PT Next Visit Plan  include manual therapy for pain relief. continue with progressive LE and  functional strengtheing; incorporate postural strengthening    PT Home Exercise Plan  Medbridge Access Code: [email or web address]    Consulted and Agree with Plan of Care  Patient       Patient will benefit from skilled therapeutic intervention in order to improve the following deficits and impairments:  Decreased strength, Impaired flexibility, Decreased activity tolerance, Impaired perceived functional ability, Pain, Decreased endurance, Difficulty walking, Abnormal gait, Decreased knowledge of use of DME, Decreased skin integrity, Decreased range of motion, Impaired sensation, Improper body mechanics, Obesity, Postural dysfunction, Increased edema, Decreased mobility, Decreased balance, Cardiopulmonary status limiting activity, Decreased coordination  Visit Diagnosis: 1. Muscle weakness (generalized)   2. Chronic bilateral low back pain, unspecified whether sciatica present   3. Difficulty in walking, not elsewhere classified   4. History of falling        Problem List Patient Active Problem List   Diagnosis Date Noted  . Lower extremity  edema 07/31/2018  . Pressure injury of skin 07/24/2018  . Surgical site infection 07/23/2018  . Wound of left leg 07/23/2018  . Atherosclerosis of artery of extremity with rest pain ([place]) 07/08/2018  . Shortness of breath 06/02/2018  . Bruit 06/02/2018  . Coronary artery disease of native artery of native heart with stable angina pectoris ([place]) 06/02/2018  . Atherosclerosis of native arteries of extremity with intermittent claudication ([place]) 05/19/2018  . Peripheral arterial disease ([place]) 05/06/2018  . Personal history of colonic polyps   . Aortic stenosis 12/24/2017  . LVH (left ventricular hypertrophy) 12/24/2017  . Hypertension with heart disease 12/24/2017  . Left atrial dilatation 12/12/2017  . Cardiac murmur 12/12/2017  . Gastrojejunal ulcer 12/04/2017  . Hyperphosphatemia 12/04/2017  . Spinal stenosis of lumbar region 06/18/2017  . Degeneration of lumbar intervertebral disc 06/02/2017  . Assistance needed with transportation 05/26/2017  . Financial difficulties 05/26/2017  . Needs assistance with community resources 05/26/2017  . Moderate recurrent major depression ([place]) 05/26/2017  . Non-healing wound of lower extremity 02/06/2017  . Status post ankle arthrodesis 12/11/2016  . Controlled substance agreement broken 10/11/2016  . Low back pain 09/25/2016  . Osteoarthritis of right subtalar joint 07/11/2016  . Posterior tibial tendinitis of right lower extremity 07/11/2016  . Breast cancer screening 06/18/2016  . Knee pain 04/03/2016  . Hyponatremia 03/01/2016  . High triglycerides 12/24/2015  . Pes planus of both feet 11/16/2015  . Plantar fasciitis of right foot 11/16/2015  . Heel spur 11/16/2015  . Diabetic neuropathy ([place]) 11/16/2015  . Right ankle pain 11/16/2015  . Medication monitoring encounter 11/16/2015  . Chronic pain of multiple joints 08/15/2015  . Abnormal mammogram of right breast 06/19/2015  . Cerumen impaction 03/16/2015  . Hyperlipidemia 12/14/2014   . History of transfusion of packed RBC 12/14/2014  . Hx of smoking 12/14/2014  . Status post bariatric surgery 12/14/2014  . Morbid obesity ([place]) 12/14/2014  . Chronic  radicular lumbar pain 12/14/2014  . History of GI bleed 11/12/2014  . GERD without esophagitis 11/12/2014  . History of small bowel obstruction 09/07/2014    Everlean Alstrom. [name], PT, DPT 01/26/19, 4:29 PM  [facility] [address], [place], [address] Phone: [phone]   Fax:  [phone]  Name: Andrea Holden MRN: [id number] Date of Birth: [birth year]

## 2019-01-27 DIAGNOSIS — M47816 Spondylosis without myelopathy or radiculopathy, lumbar region: Secondary | ICD-10-CM | POA: Diagnosis not present

## 2019-01-28 ENCOUNTER — Ambulatory Visit: Payer: Medicare Other | Admitting: Physical Therapy

## 2019-01-28 ENCOUNTER — Ambulatory Visit: Payer: Self-pay | Admitting: *Deleted

## 2019-01-28 ENCOUNTER — Encounter: Payer: Medicare Other | Admitting: Physical Therapy

## 2019-01-28 ENCOUNTER — Telehealth: Payer: Self-pay | Admitting: *Deleted

## 2019-01-28 DIAGNOSIS — Z789 Other specified health status: Secondary | ICD-10-CM

## 2019-01-28 DIAGNOSIS — Z599 Problem related to housing and economic circumstances, unspecified: Secondary | ICD-10-CM

## 2019-01-28 DIAGNOSIS — Z598 Other problems related to housing and economic circumstances: Secondary | ICD-10-CM

## 2019-01-28 NOTE — Chronic Care Management (AMB) (Signed)
  Chronic Care Management    Clinical Social Work Follow Up Note  01/28/2019 Name: Andrea Holden MRN: [id number] DOB: [birth year]  Andrea Holden is a 73 y.o. year old female who is a primary care patient of Poul[place], Bethel Born, NP. The CCM team was consulted for assistance with [name] .   Review of patient status, including review of consultants reports, other relevant assessments, and collaboration with appropriate care team members and the patient's provider was performed as part of comprehensive patient evaluation and provision of chronic care management services.     Goals Addressed            This Visit's Progress   . COMPLETED: I need help applying for food stamps (pt-stated)       Current Barriers:  . Financial constraints . Limited social support . Family and relationship dysfunction . Social Isolation  Clinical Social Work Clinical Goal(s):  Marland Kitchen Over the next 30 days, client will work with SW to address concerns related to applying for food stamps -completed  Interventions: . Patient interviewed and appropriate assessments performed to assess for continued community resource needs . Confirmed that patient has contacted the Department of Social Services and it has been determined that she has been approved for food stamps as of 12/02/2018 and her card has been mailed to her home. She is now receiving $216.00 per month. . Patient confirmed that the food stamps has increased her ability to put more money towards other things. . Patient confirmed continuing to make payment arrangements for outstanding bills, such as her utilities . 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 Positive reinforcement and assurance provided in response to patient's smoking cessation efforts.  . Discussed plans with patient to call this social worker with any additional community resource needs   Patient Self  Care Activities:  . Self administers medications as prescribed . Attends all scheduled provider appointments . Performs ADL's independently . Performs IADL's independently  Please see past updates related to this goal by clicking on the "Past Updates" button in the selected goal          Follow Up Plan: Client will call  this social worker with any future community resource needs   Elliot Gurney, [facility] Care Management [phone]

## 2019-01-28 NOTE — Chronic Care Management (AMB) (Signed)
   Chronic Care Management   Unsuccessful Call Note 01/28/2019 Name: CYDNIE DEASON MRN: [id number] DOB: [birth year]  Patient  is a 73 year old female who sees Suezanne Cheshire, NP for primary care. Suezanne Cheshire, NP asked the CCM team to consult the patient for community resources to address her financial difficulty.       This social worker was unable to reach patient via telephone today for follow up. I have left HIPAA compliant voicemail asking patient to return my call. (unsuccessful outreach #1).   Plan: Will follow-up within 7 business days via telephone.     Elliot Gurney, [place] [facility] [phone]

## 2019-01-28 NOTE — Patient Instructions (Signed)
Thank you allowing the Chronic Care Management Team to be a part of your care! It was a pleasure speaking with you today!  1. Please call this social worker with any community resource needs in the future.  CCM (Chronic Care Management) Team   Trish Fountain RN, BSN Nurse Care Coordinator  [phone]  Ruben Reason PharmD  Clinical Pharmacist  [phone]   Elliot Gurney, LCSW Clinical Social Worker [phone]  Goals Addressed            This Visit's Progress   . COMPLETED: I need help applying for food stamps (pt-stated)       Current Barriers:  . Financial constraints . Limited social support . Family and relationship dysfunction . Social Isolation  Clinical Social Work Clinical Goal(s):  Marland Kitchen Over the next 30 days, client will work with SW to address concerns related to applying for food stamps -completed  Interventions: . Patient interviewed and appropriate assessments performed to assess for continued community resource needs . Confirmed that patient has contacted the Department of Social Services and it has been determined that she has been approved for food stamps as of 12/02/2018 and her card has been mailed to her home. She is now receiving $216.00 per month. . Patient confirmed that the food stamps has increased her ability to put more money towards other things. . Patient confirmed continuing to make payment arrangements for outstanding bills, such as her utilities . 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 Positive reinforcement and assurance provided in response to patient's smoking cessation efforts.  . Discussed plans with patient to call this social worker with any additional community resource needs   Patient Self Care Activities:  . Self administers medications as prescribed . Attends all scheduled provider appointments . Performs ADL's independently . Performs IADL's  independently  Please see past updates related to this goal by clicking on the "Past Updates" button in the selected goal          The patient verbalized understanding of instructions provided today and declined a print copy of patient instruction materials.   No further follow up required: patient to call this social worker with any future community resource needs

## 2019-02-02 ENCOUNTER — Encounter: Payer: Self-pay | Admitting: Physical Therapy

## 2019-02-02 ENCOUNTER — Other Ambulatory Visit: Payer: Self-pay

## 2019-02-02 ENCOUNTER — Ambulatory Visit: Payer: Medicare Other | Attending: Nurse Practitioner | Admitting: Physical Therapy

## 2019-02-02 DIAGNOSIS — G8929 Other chronic pain: Secondary | ICD-10-CM | POA: Diagnosis not present

## 2019-02-02 DIAGNOSIS — M545 Low back pain, unspecified: Secondary | ICD-10-CM

## 2019-02-02 DIAGNOSIS — Z9181 History of falling: Secondary | ICD-10-CM | POA: Diagnosis not present

## 2019-02-02 DIAGNOSIS — M6281 Muscle weakness (generalized): Secondary | ICD-10-CM

## 2019-02-02 DIAGNOSIS — R262 Difficulty in walking, not elsewhere classified: Secondary | ICD-10-CM | POA: Diagnosis not present

## 2019-02-02 NOTE — Therapy (Signed)
[facility] [address], [place], [address] Phone: [phone]   Fax:  [phone]  Physical Therapy Treatment  Patient Details  Name: Andrea Holden MRN: [id number] Date of Birth: Sep 18, 1945 Referring Provider (PT): Fredderick Severance, NP    Encounter Date: 02/02/2019  PT End of Session - 02/02/19 1555    Visit Number  18    Number of Visits  33    Date for PT Re-Evaluation  04/20/19    Authorization Type  [facility] Medicare reporting period from 01/26/2019)    Authorization Time Period  Current cert period 07/28/7865 - 03/23/2019 (latest PN: 01/26/2019)    Authorization - Visit Number  1    Authorization - Number of Visits  10    PT Start Time  1355    PT Stop Time  1435    PT Time Calculation (min)  40 min    Activity Tolerance  Patient limited by fatigue;Patient limited by pain    Behavior During Therapy  [facility] for tasks assessed/performed       Past Medical History:  Diagnosis Date  . Allergy    seasonal allergies  . Anemia 2014   needed 5 units of blood d/t passing out, weak  . Anxiety   . Aortic stenosis 12/24/2017   Echo Aug 2018  . Arthritis   . Asthma    allergy induced asthma  . Cardiac murmur 12/12/2017  . Cataract   . Cataract    left  . Complication of anesthesia    arrhythmia following colonoscopy  . Degenerative disc disease, lumbar   . Degenerative disc disease, lumbar   . Depression   . Diabetes mellitus   . Diabetic neuropathy ([place]) 11/16/2015  . Dysrhythmia    stenosis of left ventricle  . GERD (gastroesophageal reflux disease)   . H/O transfusion    patient was given 5 units of blood while at [facility], blood type O+  . History of chicken pox   . History of hiatal hernia   . History of measles, mumps, or rubella   . HOH (hard of hearing)    does not use hearing aides yet  . Hyperlipidemia   . Hypertension   . Irregular heartbeat   . LVH (left ventricular hypertrophy)  12/24/2017   Echo Aug 2018  . Neuropathy   . Opiate use 11/16/2015  . Peripheral arterial disease ([place]) 05/06/2018   At rest, left > right; refer to vasc  . Pes planus of both feet 11/16/2015  . Plantar fasciitis of right foot 11/16/2015  . Wheezing     Past Surgical History:  Procedure Laterality Date  . ABDOMINAL HYSTERECTOMY    . APPLICATION OF WOUND VAC Left 07/23/2018   Procedure: APPLICATION OF WOUND VAC;  Surgeon: Algernon Huxley, MD;  Location: [facility] ORS;  Service: Vascular;  Laterality: Left;  . banck injections    . CATARACT EXTRACTION W/PHACO Right 05/30/2015   Procedure: CATARACT EXTRACTION PHACO AND INTRAOCULAR LENS PLACEMENT (IOC);  Surgeon: Birder Robson, MD;  Location: [facility] ORS;  Service: Ophthalmology;  Laterality: Right;  [place] 00:57 AP% 20.9 CDE 11.99 fluid pack lot #[id number]  . CHOLECYSTECTOMY  1970  . COLON SURGERY  2013   blocked colon  . COLONOSCOPY WITH PROPOFOL N/A 01/20/2018   Procedure: COLONOSCOPY WITH PROPOFOL;  Surgeon: Lucilla Lame, MD;  Location: [facility] ENDOSCOPY;  Service: Endoscopy;  Laterality: N/A;  . ENDARTERECTOMY FEMORAL Left 07/08/2018   Procedure: ENDARTERECTOMY  FEMORAL;  Surgeon: Algernon Huxley, MD;  Location: [facility] ORS;  Service: Vascular;  Laterality: Left;  . EYE SURGERY Right    cataract surgery  . FOOT FUSION Right 2018   metal in foot  . GALLBLADDER SURGERY  1970  . GASTRIC BYPASS  2010   lost 178 lbs and regained 40 lbs last few years  . HUMERUS FRACTURE SURGERY Right    metal plate with screws  . internal bleeding  2016   ulcer in past  . LOWER EXTREMITY ANGIOGRAM Left 07/08/2018   Procedure: LOWER EXTREMITY ANGIOGRAM;  Surgeon: Algernon Huxley, MD;  Location: [facility] ORS;  Service: Vascular;  Laterality: Left;  . LOWER EXTREMITY ANGIOGRAPHY Left 05/27/2018   Procedure: LOWER EXTREMITY ANGIOGRAPHY;  Surgeon: Algernon Huxley, MD;  Location: [facility];  Service: Cardiovascular;  Laterality: Left;  . LOWER EXTREMITY ANGIOGRAPHY Right 12/07/2018    Procedure: LOWER EXTREMITY ANGIOGRAPHY;  Surgeon: Algernon Huxley, MD;  Location: [place];  Service: Cardiovascular;  Laterality: Right;  . MOUTH SURGERY     root canals and crowns and extractions  . OVARY SURGERY    . SKIN GRAFT Right 2018   RT foot. foot has been rebuilt.  it is full of metal  . TENOTOMY ACHILLES TENDON Right    Percuntaneous. metal in foot  . TONSILLECTOMY    . WOUND DEBRIDEMENT Left 07/23/2018   Procedure: DEBRIDEMENT WOUND;  Surgeon: Algernon Huxley, MD;  Location: [facility] ORS;  Service: Vascular;  Laterality: Left;    There were no vitals filed for this visit.  Subjective Assessment - 02/02/19 1552    Subjective  Patient reports she had a tooth pulled a day or two ago and may need to take it easy today. She states she is not allowed to put her head down but can lay down. She states she got her lumbar injections last week and felt great for a couple of days but now it seems it is wearing off and she is worried it will not work well this time. She states her back currently hurts at 5-6/10 pain. She notices her R leg gets weak faster than her left, but it has been doing this for a long time.    Pertinent History  Patient is a 73 y.o. female who presents to outpatient physical therapy with a referral for medical diagnosis of degeneration of lumbar intervertebral disc. This patient's chief complaints consist of chronic low back pain and foot pain, weakness, stiffness, leading to the following functional deficits: difficulty with with basic ADLs, IADLs, ambulation. She used to get injections to help control back pain but it has recently worsened due to being unable to get injections during [place] pandemic. The patient has been informed of current processes in place at [facility]patient [facility] to protect patients from Covid-19 exposure including social distancing, schedule modifications, and new cleaning procedures. After discussing their particular risk with a therapist based on the  patient's personal risk factors, the patient has decided to proceed with in-person therapy. Pt is scheduled for R LE angiography on 12/07/2018. She has recently started smoking again with the stress of COVID19 pandemic and feels strongly she needs to stop. Surgeon has stated in the past he will not complete surgery if she is smoking.  Relevant past medical history and comorbidities include underwent extensive vascular surgeries in Jan 2020 to improve blood flow, similar surgery for R leg scheduled 12/07/2018. Has a history of left carotid artery stenosis, but  not enough to have surgery, has a history of multiple arterial surgeries, history of R cataract surgery, colon surgery, cholecystectomy, R foot skin graft, ovary surgery, gastric bypass, R foot fusion, abdominal hysterectomy; current smoker, plantar fasciitis, peripheral arterial disease, LVH, diabetes mellitus with neuropathy, irregular heartbeat, hiatal hernia, GERD, stenosis of left heart ventricle, depression, lumbar degenerative disc disease, cardiac murmur, asthma, anxiety, obesity, HTN, and seasonal allergies She has been advised not to lift over 10 pounds by physician    Limitations  Sitting;Standing;Walking;House hold activities;Lifting    How long can you sit comfortably?  > 1 hour in almost any chair    How long can you stand comfortably?  4-5 minutes    How long can you walk comfortably?  2 minutes, depends on what she has with her. Able to walk at fair with rollator. To mailbox 120 feet causes shaking in R leg.      Diagnostic tests  MRI lumbar spine report 06/14/2017: "IMPRESSION: 1. Stable degenerative of lumbar spondylosis and scoliosis with multilevel disc disease and facet disease. 2. Stable right foraminal stenosis at L5-S1 due to right-sided disc    Currently in Pain?  Yes    Pain Score  6     Pain Location  Back    Pain Descriptors / Indicators  Aching    Pain Type  Chronic pain    Pain Onset  More than a month ago        TREATMENT:    Therapeutic exercise:to centralize symptoms and improve ROM, strength, muscular endurance, and activity tolerance required for successful completion of functional activities.   -NuStep level3using bilateral upper and lower extremities. Seatsetting 8/handle setting7. For improved extremity mobility, muscular endurance, and activity tolerance; and to induce the analgesic effect of aerobic exercise, stimulate improved joint nutrition, and prepare body structures and systems for following interventions. x47mnuteswith SPM above 60during subjective exam. - sit <> supine and prone scooting with min A to get to and from prone position  in a comfortable position with pillows placed under hips and feet for manual. Patient required prolonged time to find comfortable position with several failed attempts and extended time to get up due to difficulty moving related to pain and weakness.   Manual therapy:to reduce pain and tissue tension, improve range of motion, neuromodulation, in order to promote improved ability to complete functional activities. - prone with head supported on blue bolster/head girdle at first, then moved to hole in table. Pillow under hips and lower legs: STM to  bilateral low back, thoracis spine, bilateral UT, and bilateral glutes to help decrease pain and tension. pateint reported decrease in pain from 6/10 to 2/10 following. Noted for significant tightness and pain in R glute region that decreased with continued STM as tolerated.     PT Education - 02/02/19 1554    Education provided  Yes    Education Details  purpose of manual therapy    Person(s) Educated  Patient    Methods  Explanation;Demonstration;Verbal cues    Comprehension  Verbalized understanding;Returned demonstration;Verbal cues required       PT Short Term Goals - 01/26/19 1615      PT SHORT TERM GOAL #1   Title  Be independent with initial home exercise program for self-management of  symptoms.    Baseline  Initial HEP provided at [facility] (11/17/2018); was completing until vascular procedure, re-instated 12/15/2018 (12/15/2018); currently participating (01/26/2019)    Time  2    Period  [place]  Status  Achieved    Target Date  01/19/19        PT Long Term Goals - 01/26/19 1616      PT LONG TERM GOAL #1   Title  Be independent with a long-term home exercise program for self-management of symptoms.     Baseline  Initial HEP provided at [facility] (11/17/2018); was doing better before vascular surgery, is working at returning to [facility] since procedure (12/15/2018); currently participating but not assigned final long term HEP yet (01/26/2019);    Time  12    Period  Weeks    Status  Partially Met    Target Date  04/20/19      PT LONG TERM GOAL #2   Title  Patient will improve 6MWT to equal or greater than 1000 feet with LRAD to demonstrate improved activity tolerance for community ambulation.     Baseline  6 Minute Walk Test: 200 feet with SPC and supervision. Stopped at 1:43 due to fatigue and leg pain. (11/17/2018); 457 feet SPC, on seated and one standing break (12/15/2018); 547 with SPC, 2 seated and one standing rest break (01/26/2019);    Time  12    Period  Weeks    Status  Partially Met    Target Date  04/20/19      PT LONG TERM GOAL #3   Title  Patient will demonstrate improved 5 Times Sit to Stand test to 11 seconds from chair height with no UE support to demonstrate improvement in LE power and functional strength for daily activities.    Baseline  5 Time Sit to Stand: 27 seconds from chair height no UE support (11/17/2018); 18 seconds, no UE support from chair-high plinth (12/15/2018); 16 seconds with no UE support from chair (01/26/2019);    Time  12    Period  Weeks    Status  Partially Met    Target Date  04/20/19      PT LONG TERM GOAL #4   Title  Patient will ambulate faster than 1.16ms on the 10 Meter Walk Test to improve ability to cross street safey for community  participation.     Baseline  testing deferred to next session (11/17/2018); 1.37 m/sec with SPC (11/24/2018); 1.3 m/sec with SPC (12/15/2018; 01/26/2019);    Time  12    Period  Weeks    Status  Achieved    Target Date  04/20/19      PT LONG TERM GOAL #5   Title  Patient will score equal or greater than  25/30 on the Functional Gait Assessment to demonstrate low fall risk to improve her ability to participate safely in community activities.     Baseline  testing deferred to next session (11/17/2018); 11/30 high fall risk (11/24/2018); unable to measure due to time limitation (12/15/2018); 13/30 high fall risk (12/17/2018); 14/30 high fall risk (01/26/2019);    Time  12    Period  Weeks    Status  Partially Met    Target Date  04/20/19      Additional Long Term Goals   Additional Long Term Goals  Yes      PT LONG TERM GOAL #6   Title  Reduce pain to equal or less than 4/10 with functional activities to allow patient to complete valued functional tasks such as caring for dogs, grocery shopping, walking with less difficulty.    Baseline  8/10 (01/26/2019);    Time  12    Period  Weeks  Status  New    Target Date  04/20/19            Plan - 02/02/19 1600    Clinical Impression Statement  Pt tolerated treatment well today and reported decreasing pain with manual therapy from 6/10 to 2/10 reporting improved feeling of relaxation. She was particularly tight in R glute region. Patient had great difficulty with bed mobility and transfers due to weakness and pain. Pt was able to complete all exercises with minimal to no lasting increase in pain or discomfort. Pt required multimodal cuing for proper technique and to facilitate improved neuromuscular control, strength, range of motion, and functional ability resulting in improved performance and form. She required min A to get properly positioned on table for manual and get up safely. Patient would benefit from continued physical therapy to address  remaining impairments and functional limitations to work towards stated goals and return to PLOF or maximal functional independence.    Personal Factors and Comorbidities  Age;Comorbidity 3+;Fitness;Time since onset of injury/illness/exacerbation;Past/Current Experience;Other    Comorbidities  underwent extensive vascular surgeries in Jan 2020 to improve blood flow, similar surgery for R leg scheduled 12/07/2018. Has a history of left carotid artery stenosis, but not enough to have surgery, has a history of multiple arterial surgeries, history of R cataract surgery, colon surgery, cholecystectomy, R foot skin graft, ovary surgery, gastric bypass, R foot fusion, abdominal hysterectomy; current smoker, plantar fasciitis, peripheral arterial disease, LVH, diabetes mellitus with neuropathy, irregular heartbeat, hiatal hernia, GERD, stenosis of left heart ventricle, depression, lumbar degenerative disc disease, cardiac murmur, asthma, anxiety, obesity, HTN, and seasonal allergies She has been advised not to lift over 10 pounds by physician.      Examination-Activity Limitations  Bathing;Carry;Lift;Sit;Stand;Locomotion Level;Toileting;Dressing;Squat;Transfers;Stairs;Hygiene/Grooming;Other    Examination-Participation Restrictions  Community Activity;Volunteer;Cleaning;Interpersonal Relationship;Yard Work;Other    Rehab Potential  Fair    Clinical Impairments Affecting Rehab Potential  (+) motivated (-) muliple co morbidities, chronic condition    PT Frequency  2x / week    PT Duration  12 weeks    PT Treatment/Interventions  Moist Heat;Patient/family education;Neuromuscular re-education;Therapeutic exercise;Manual techniques;ADLs/Self Care Home Management;Electrical Stimulation;Gait training;Stair training;Functional mobility training;Therapeutic activities;Balance training;Passive range of motion;Dry needling;Energy conservation;Spinal Manipulations;Joint Manipulations;Cryotherapy    PT Next Visit Plan  include  manual therapy for pain relief. continue with progressive LE and  functional strengtheing; incorporate postural strengthening    PT Home Exercise Plan  Medbridge Access Code: [email or web address]    Consulted and Agree with Plan of Care  Patient       Patient will benefit from skilled therapeutic intervention in order to improve the following deficits and impairments:  Decreased strength, Impaired flexibility, Decreased activity tolerance, Impaired perceived functional ability, Pain, Decreased endurance, Difficulty walking, Abnormal gait, Decreased knowledge of use of DME, Decreased skin integrity, Decreased range of motion, Impaired sensation, Improper body mechanics, Obesity, Postural dysfunction, Increased edema, Decreased mobility, Decreased balance, Cardiopulmonary status limiting activity, Decreased coordination  Visit Diagnosis: 1. Muscle weakness (generalized)   2. Chronic bilateral low back pain, unspecified whether sciatica present   3. Difficulty in walking, not elsewhere classified   4. History of falling        Problem List Patient Active Problem List   Diagnosis Date Noted  . Lower extremity edema 07/31/2018  . Pressure injury of skin 07/24/2018  . Surgical site infection 07/23/2018  . Wound of left leg 07/23/2018  . Atherosclerosis of artery of extremity with rest pain ([place]) 07/08/2018  . Shortness of breath 06/02/2018  .  Bruit 06/02/2018  . Coronary artery disease of native artery of native heart with stable angina pectoris ([place]) 06/02/2018  . Atherosclerosis of native arteries of extremity with intermittent claudication ([place]) 05/19/2018  . Peripheral arterial disease ([place]) 05/06/2018  . Personal history of colonic polyps   . Aortic stenosis 12/24/2017  . LVH (left ventricular hypertrophy) 12/24/2017  . Hypertension with heart disease 12/24/2017  . Left atrial dilatation 12/12/2017  . Cardiac murmur 12/12/2017  . Gastrojejunal ulcer 12/04/2017  . Hyperphosphatemia 12/04/2017   . Spinal stenosis of lumbar region 06/18/2017  . Degeneration of lumbar intervertebral disc 06/02/2017  . Assistance needed with transportation 05/26/2017  . Financial difficulties 05/26/2017  . Needs assistance with community resources 05/26/2017  . Moderate recurrent major depression ([place]) 05/26/2017  . Non-healing wound of lower extremity 02/06/2017  . Status post ankle arthrodesis 12/11/2016  . Controlled substance agreement broken 10/11/2016  . Low back pain 09/25/2016  . Osteoarthritis of right subtalar joint 07/11/2016  . Posterior tibial tendinitis of right lower extremity 07/11/2016  . Breast cancer screening 06/18/2016  . Knee pain 04/03/2016  . Hyponatremia 03/01/2016  . High triglycerides 12/24/2015  . Pes planus of both feet 11/16/2015  . Plantar fasciitis of right foot 11/16/2015  . Heel spur 11/16/2015  . Diabetic neuropathy ([place]) 11/16/2015  . Right ankle pain 11/16/2015  . Medication monitoring encounter 11/16/2015  . Chronic pain of multiple joints 08/15/2015  . Abnormal mammogram of right breast 06/19/2015  . Cerumen impaction 03/16/2015  . Hyperlipidemia 12/14/2014  . History of transfusion of packed RBC 12/14/2014  . Hx of smoking 12/14/2014  . Status post bariatric surgery 12/14/2014  . Morbid obesity ([place]) 12/14/2014  . Chronic radicular lumbar pain 12/14/2014  . History of GI bleed 11/12/2014  . GERD without esophagitis 11/12/2014  . History of small bowel obstruction 09/07/2014    Everlean Alstrom. [name], PT, DPT 02/02/19, 4:01 PM  [place] [facility] [address], [place], [address] Phone: [phone]   Fax:  [phone]  Name: Andrea Holden MRN: [id number] Date of Birth: 10-Dec-1945

## 2019-02-04 ENCOUNTER — Encounter: Payer: Self-pay | Admitting: Physical Therapy

## 2019-02-04 ENCOUNTER — Telehealth: Payer: Self-pay

## 2019-02-04 ENCOUNTER — Other Ambulatory Visit: Payer: Self-pay

## 2019-02-04 ENCOUNTER — Ambulatory Visit: Payer: Medicare Other | Admitting: Physical Therapy

## 2019-02-04 DIAGNOSIS — M545 Low back pain: Secondary | ICD-10-CM | POA: Diagnosis not present

## 2019-02-04 DIAGNOSIS — M6281 Muscle weakness (generalized): Secondary | ICD-10-CM | POA: Diagnosis not present

## 2019-02-04 DIAGNOSIS — R262 Difficulty in walking, not elsewhere classified: Secondary | ICD-10-CM | POA: Diagnosis not present

## 2019-02-04 DIAGNOSIS — Z9181 History of falling: Secondary | ICD-10-CM

## 2019-02-04 DIAGNOSIS — G8929 Other chronic pain: Secondary | ICD-10-CM

## 2019-02-04 NOTE — Therapy (Signed)
[facility] [address], [place], [address] Phone: [phone]   Fax:  [phone]  Physical Therapy Treatment  Patient Details  Name: Andrea Holden MRN: [id number] Date of Birth: Jan 08, 1946 Referring Provider (PT): Fredderick Severance, NP    Encounter Date: 02/04/2019  PT End of Session - 02/04/19 1845    Visit Number  19    Number of Visits  33    Date for PT Re-Evaluation  04/20/19    Authorization Type  [facility] Medicare reporting period from 01/26/2019)    Authorization Time Period  Current cert period 5/37/4827 - 03/23/2019 (latest PN: 01/26/2019)    Authorization - Visit Number  2    Authorization - Number of Visits  10    PT Start Time  1350    PT Stop Time  1430    PT Time Calculation (min)  40 min    Activity Tolerance  Patient limited by fatigue;Patient limited by pain    Behavior During Therapy  [facility] for tasks assessed/performed       Past Medical History:  Diagnosis Date  . Allergy    seasonal allergies  . Anemia 2014   needed 5 units of blood d/t passing out, weak  . Anxiety   . Aortic stenosis 12/24/2017   Echo Aug 2018  . Arthritis   . Asthma    allergy induced asthma  . Cardiac murmur 12/12/2017  . Cataract   . Cataract    left  . Complication of anesthesia    arrhythmia following colonoscopy  . Degenerative disc disease, lumbar   . Degenerative disc disease, lumbar   . Depression   . Diabetes mellitus   . Diabetic neuropathy ([place]) 11/16/2015  . Dysrhythmia    stenosis of left ventricle  . GERD (gastroesophageal reflux disease)   . H/O transfusion    patient was given 5 units of blood while at [place], blood type O+  . History of chicken pox   . History of hiatal hernia   . History of measles, mumps, or rubella   . HOH (hard of hearing)    does not use hearing aides yet  . Hyperlipidemia   . Hypertension   . Irregular heartbeat   . LVH (left ventricular hypertrophy)  12/24/2017   Echo Aug 2018  . Neuropathy   . Opiate use 11/16/2015  . Peripheral arterial disease ([place]) 05/06/2018   At rest, left > right; refer to vasc  . Pes planus of both feet 11/16/2015  . Plantar fasciitis of right foot 11/16/2015  . Wheezing     Past Surgical History:  Procedure Laterality Date  . ABDOMINAL HYSTERECTOMY    . APPLICATION OF WOUND VAC Left 07/23/2018   Procedure: APPLICATION OF WOUND VAC;  Surgeon: Algernon Huxley, MD;  Location: [facility] ORS;  Service: Vascular;  Laterality: Left;  . banck injections    . CATARACT EXTRACTION W/PHACO Right 05/30/2015   Procedure: CATARACT EXTRACTION PHACO AND INTRAOCULAR LENS PLACEMENT (IOC);  Surgeon: Birder Robson, MD;  Location: [facility] ORS;  Service: Ophthalmology;  Laterality: Right;  [place] 00:57 AP% 20.9 CDE 11.99 fluid pack lot #[id number]  . CHOLECYSTECTOMY  1970  . COLON SURGERY  2013   blocked colon  . COLONOSCOPY WITH PROPOFOL N/A 01/20/2018   Procedure: COLONOSCOPY WITH PROPOFOL;  Surgeon: Lucilla Lame, MD;  Location: [facility] ENDOSCOPY;  Service: Endoscopy;  Laterality: N/A;  . ENDARTERECTOMY FEMORAL Left 07/08/2018   Procedure: ENDARTERECTOMY  FEMORAL;  Surgeon: Algernon Huxley, MD;  Location: [facility] ORS;  Service: Vascular;  Laterality: Left;  . EYE SURGERY Right    cataract surgery  . FOOT FUSION Right 2018   metal in foot  . GALLBLADDER SURGERY  1970  . GASTRIC BYPASS  2010   lost 178 lbs and regained 40 lbs last few years  . HUMERUS FRACTURE SURGERY Right    metal plate with screws  . internal bleeding  2016   ulcer in past  . LOWER EXTREMITY ANGIOGRAM Left 07/08/2018   Procedure: LOWER EXTREMITY ANGIOGRAM;  Surgeon: Algernon Huxley, MD;  Location: [facility] ORS;  Service: Vascular;  Laterality: Left;  . LOWER EXTREMITY ANGIOGRAPHY Left 05/27/2018   Procedure: LOWER EXTREMITY ANGIOGRAPHY;  Surgeon: Algernon Huxley, MD;  Location: [facility];  Service: Cardiovascular;  Laterality: Left;  . LOWER EXTREMITY ANGIOGRAPHY Right 12/07/2018    Procedure: LOWER EXTREMITY ANGIOGRAPHY;  Surgeon: Algernon Huxley, MD;  Location: [facility];  Service: Cardiovascular;  Laterality: Right;  . MOUTH SURGERY     root canals and crowns and extractions  . OVARY SURGERY    . SKIN GRAFT Right 2018   RT foot. foot has been rebuilt.  it is full of metal  . TENOTOMY ACHILLES TENDON Right    Percuntaneous. metal in foot  . TONSILLECTOMY    . WOUND DEBRIDEMENT Left 07/23/2018   Procedure: DEBRIDEMENT WOUND;  Surgeon: Algernon Huxley, MD;  Location: [facility] OR[facility];  Service: Vascular;  Laterality: Left;    There were no vitals filed for this visit.  Subjective Assessment - 02/04/19 1842    Subjective  Patient reports the right side of her back and glute is hurting a lot today 6/10, and the left side is feeling better 2/10. She states her L low back has felt better since last treatment sesison and the R side felt better until yesterday. She states she has stopped  doing her sit <> stands at home and continues to be working on smoking cessation, with 1 pack per 3 days where she is currently maintaining. Is frustrated at the extent of her R sided pain.    Pertinent History  Patient is a 73 y.o. female who presents to outpatient physical therapy with a referral for medical diagnosis of degeneration of lumbar intervertebral disc. This patient's chief complaints consist of chronic low back pain and foot pain, weakness, stiffness, leading to the following functional deficits: difficulty with with basic ADLs, IADLs, ambulation. She used to get injections to help control back pain but it has recently worsened due to being unable to get injections during [place] pandemic. The patient has been informed of current processes in place at [facility]patient [facility] to protect patients from Covid-19 exposure including social distancing, schedule modifications, and new cleaning procedures. After discussing their particular risk with a therapist based on the patient's personal risk  factors, the patient has decided to proceed with in-person therapy. Pt is scheduled for R LE angiography on 12/07/2018. She has recently started smoking again with the stress of COVID19 pandemic and feels strongly she needs to stop. Surgeon has stated in the past he will not complete surgery if she is smoking.  Relevant past medical history and comorbidities include underwent extensive vascular surgeries in Jan 2020 to improve blood flow, similar surgery for R leg scheduled 12/07/2018. Has a history of left carotid artery stenosis, but not enough to have surgery, has a history of multiple arterial surgeries, history of  R cataract surgery, colon surgery, cholecystectomy, R foot skin graft, ovary surgery, gastric bypass, R foot fusion, abdominal hysterectomy; current smoker, plantar fasciitis, peripheral arterial disease, LVH, diabetes mellitus with neuropathy, irregular heartbeat, hiatal hernia, GERD, stenosis of left heart ventricle, depression, lumbar degenerative disc disease, cardiac murmur, asthma, anxiety, obesity, HTN, and seasonal allergies She has been advised not to lift over 10 pounds by physician    Limitations  Sitting;Standing;Walking;House hold activities;Lifting    How long can you sit comfortably?  > 1 hour in almost any chair    How long can you stand comfortably?  4-5 minutes    How long can you walk comfortably?  2 minutes, depends on what she has with her. Able to walk at fair with rollator. To mailbox 120 feet causes shaking in R leg.      Diagnostic tests  MRI lumbar spine report 06/14/2017: "IMPRESSION: 1. Stable degenerative of lumbar spondylosis and scoliosis with multilevel disc disease and facet disease. 2. Stable right foraminal stenosis at L5-S1 due to right-sided disc    Currently in Pain?  Yes    Pain Score  6     Pain Onset  More than a month ago       TREATMENT:    Therapeutic exercise:to centralize symptoms and improve ROM, strength, muscular endurance, and activity  tolerance required for successful completion of functional activities.  -NuStep level3using bilateral upper and lower extremities. Seatsetting 8/handle setting7. For improved extremity mobility, muscular endurance, and activity tolerance; and to induce the analgesic effect of aerobic exercise, stimulate improved joint nutrition, and prepare body structures and systems for following interventions. x71mnuteswith SPM above60during subjective exam. - Prone hip extension with abdominal brace and with knee flexed to preferentially bias gluteus maximus. 3x10 Cuing to keep knee flexed. Pt required time to learn and for transitions between sides.  - sit <>supine and prone scooting with min A to get to and from prone position  in a comfortable position with pillows placed under hips and feet for manual. Patient required prolonged time to find comfortable position and get up due to pain, weakness, and fatigue.  - forward and backward walking with band around chest pulling backwards. Walking out to touch 6 cones with feet in random order 2x. Repeated with side stepping leading with each side x 6 cones while holding red theraband attached to pole in one hand, requiring stretch to reach cones. Kicking cones over this time and completing in order, walking forward, then backwards to reach the cones. To improve balance, functional activity tolerance, and trunk strength in an environment that encourages focus on task to distract from pain and difficulty to make it more enjoyable. Required SPC and CGA throughout task. Fatigued by end. Demonstrated very stooped posture in order to see feet and cones going backwards.   Manual therapy:to reduce pain and tissue tension, improve range of motion, neuromodulation, in order to promote improved ability to complete functional activities. -prone with head resting in flat table girdle. Pillow under hips and lower legs: STM to  bilateral low back and upper glutes focusing on R  >L, thoracic spine, bilateral UT to help decrease pain and tension. Noted for decreased tightness in R glute compared to last session.    PT Education - 02/04/19 1844    Education provided  Yes    Education Details  Exercise purpose/form. Self management techniques. pain behavior and relationship to tissue damage, deconditioning, and functioning. Importance of staying active and confronting pain. HEP  Person(s) Educated  Patient    Methods  Explanation;Demonstration;Tactile cues;Verbal cues    Comprehension  Verbalized understanding;Returned demonstration;Verbal cues required       PT Short Term Goals - 01/26/19 1615      PT SHORT TERM GOAL #1   Title  Be independent with initial home exercise program for self-management of symptoms.    Baseline  Initial HEP provided at [facility] (11/17/2018); was completing until vascular procedure, re-instated 12/15/2018 (12/15/2018); currently participating (01/26/2019)    Time  2    Period  Weeks    Status  Achieved    Target Date  01/19/19        PT Long Term Goals - 01/26/19 1616      PT LONG TERM GOAL #1   Title  Be independent with a long-term home exercise program for self-management of symptoms.     Baseline  Initial HEP provided at [facility] (11/17/2018); was doing better before vascular surgery, is working at returning to [facility] since procedure (12/15/2018); currently participating but not assigned final long term HEP yet (01/26/2019);    Time  12    Period  Weeks    Status  Partially Met    Target Date  04/20/19      PT LONG TERM GOAL #2   Title  Patient will improve 6MWT to equal or greater than 1000 feet with LRAD to demonstrate improved activity tolerance for community ambulation.     Baseline  6 Minute Walk Test: 200 feet with SPC and supervision. Stopped at 1:43 due to fatigue and leg pain. (11/17/2018); 457 feet SPC, on seated and one standing break (12/15/2018); 547 with SPC, 2 seated and one standing rest break (01/26/2019);    Time  12    Period   Weeks    Status  Partially Met    Target Date  04/20/19      PT LONG TERM GOAL #3   Title  Patient will demonstrate improved 5 Times Sit to Stand test to 11 seconds from chair height with no UE support to demonstrate improvement in LE power and functional strength for daily activities.    Baseline  5 Time Sit to Stand: 27 seconds from chair height no UE support (11/17/2018); 18 seconds, no UE support from chair-high plinth (12/15/2018); 16 seconds with no UE support from chair (01/26/2019);    Time  12    Period  Weeks    Status  Partially Met    Target Date  04/20/19      PT LONG TERM GOAL #4   Title  Patient will ambulate faster than 1.74ms on the 10 Meter Walk Test to improve ability to cross street safey for community participation.     Baseline  testing deferred to next session (11/17/2018); 1.37 m/sec with SPC (11/24/2018); 1.3 m/sec with SPC (12/15/2018; 01/26/2019);    Time  12    Period  Weeks    Status  Achieved    Target Date  04/20/19      PT LONG TERM GOAL #5   Title  Patient will score equal or greater than  25/30 on the Functional Gait Assessment to demonstrate low fall risk to improve her ability to participate safely in community activities.     Baseline  testing deferred to next session (11/17/2018); 11/30 high fall risk (11/24/2018); unable to measure due to time limitation (12/15/2018); 13/30 high fall risk (12/17/2018); 14/30 high fall risk (01/26/2019);    Time  12  Period  Weeks    Status  Partially Met    Target Date  04/20/19      Additional Long Term Goals   Additional Long Term Goals  Yes      PT LONG TERM GOAL #6   Title  Reduce pain to equal or less than 4/10 with functional activities to allow patient to complete valued functional tasks such as caring for dogs, grocery shopping, walking with less difficulty.    Baseline  8/10 (01/26/2019);    Time  12    Period  Weeks    Status  New    Target Date  04/20/19            Plan - 02/04/19 1857    Clinical  Impression Statement  Patient tolerated treatment well and reported decrease in pain from 6/10 to 4/10 in R lumbar region. She tolerated task-based activity with less pain than previous exercises, but demo poor posture throughout. Patient continues to be limited by pain, weakness, imbalance, and poor activity tolerance. Pt required multimodal cuing for proper technique and to facilitate improved neuromuscular control, strength, range of motion, and functional ability resulting in improved performance and form. Patient would benefit from continued physical therapy to address remaining impairments and functional limitations to work towards stated goals and return to PLOF or maximal functional independence.    Personal Factors and Comorbidities  Age;Comorbidity 3+;Fitness;Time since onset of injury/illness/exacerbation;Past/Current Experience;Other    Comorbidities  underwent extensive vascular surgeries in Jan 2020 to improve blood flow, similar surgery for R leg scheduled 12/07/2018. Has a history of left carotid artery stenosis, but not enough to have surgery, has a history of multiple arterial surgeries, history of R cataract surgery, colon surgery, cholecystectomy, R foot skin graft, ovary surgery, gastric bypass, R foot fusion, abdominal hysterectomy; current smoker, plantar fasciitis, peripheral arterial disease, LVH, diabetes mellitus with neuropathy, irregular heartbeat, hiatal hernia, GERD, stenosis of left heart ventricle, depression, lumbar degenerative disc disease, cardiac murmur, asthma, anxiety, obesity, HTN, and seasonal allergies She has been advised not to lift over 10 pounds by physician.      Examination-Activity Limitations  Bathing;Carry;Lift;Sit;Stand;Locomotion Level;Toileting;Dressing;Squat;Transfers;Stairs;Hygiene/Grooming;Other    Examination-Participation Restrictions  Community Activity;Volunteer;Cleaning;Interpersonal Relationship;Yard Work;Other    Rehab Potential  Fair    Clinical  Impairments Affecting Rehab Potential  (+) motivated (-) muliple co morbidities, chronic condition    PT Frequency  2x / week    PT Duration  12 weeks    PT Treatment/Interventions  Moist Heat;Patient/family education;Neuromuscular re-education;Therapeutic exercise;Manual techniques;ADLs/Self Care Home Management;Electrical Stimulation;Gait training;Stair training;Functional mobility training;Therapeutic activities;Balance training;Passive range of motion;Dry needling;Energy conservation;Spinal Manipulations;Joint Manipulations;Cryotherapy    PT Next Visit Plan  include manual therapy for pain relief. continue with progressive LE and  functional strengtheing; incorporate postural strengthening    PT Home Exercise Plan  Medbridge Access Code: [email or web address]    Consulted and Agree with Plan of Care  Patient       Patient will benefit from skilled therapeutic intervention in order to improve the following deficits and impairments:  Decreased strength, Impaired flexibility, Decreased activity tolerance, Impaired perceived functional ability, Pain, Decreased endurance, Difficulty walking, Abnormal gait, Decreased knowledge of use of DME, Decreased skin integrity, Decreased range of motion, Impaired sensation, Improper body mechanics, Obesity, Postural dysfunction, Increased edema, Decreased mobility, Decreased balance, Cardiopulmonary status limiting activity, Decreased coordination  Visit Diagnosis: 1. Muscle weakness (generalized)   2. Chronic bilateral low back pain, unspecified whether sciatica present   3. Difficulty in walking, not elsewhere  classified   4. History of falling        Problem List Patient Active Problem List   Diagnosis Date Noted  . Lower extremity edema 07/31/2018  . Pressure injury of skin 07/24/2018  . Surgical site infection 07/23/2018  . Wound of left leg 07/23/2018  . Atherosclerosis of artery of extremity with rest pain ([place]) 07/08/2018  . Shortness of breath  06/02/2018  . Bruit 06/02/2018  . Coronary artery disease of native artery of native heart with stable angina pectoris ([place]) 06/02/2018  . Atherosclerosis of native arteries of extremity with intermittent claudication ([place]) 05/19/2018  . Peripheral arterial disease ([place]) 05/06/2018  . Personal history of colonic polyps   . Aortic stenosis 12/24/2017  . LVH (left ventricular hypertrophy) 12/24/2017  . Hypertension with heart disease 12/24/2017  . Left atrial dilatation 12/12/2017  . Cardiac murmur 12/12/2017  . Gastrojejunal ulcer 12/04/2017  . Hyperphosphatemia 12/04/2017  . Spinal stenosis of lumbar region 06/18/2017  . Degeneration of lumbar intervertebral disc 06/02/2017  . Assistance needed with transportation 05/26/2017  . Financial difficulties 05/26/2017  . Needs assistance with community resources 05/26/2017  . Moderate recurrent major depression ([place]) 05/26/2017  . Non-healing wound of lower extremity 02/06/2017  . Status post ankle arthrodesis 12/11/2016  . Controlled substance agreement broken 10/11/2016  . Low back pain 09/25/2016  . Osteoarthritis of right subtalar joint 07/11/2016  . Posterior tibial tendinitis of right lower extremity 07/11/2016  . Breast cancer screening 06/18/2016  . Knee pain 04/03/2016  . Hyponatremia 03/01/2016  . High triglycerides 12/24/2015  . Pes planus of both feet 11/16/2015  . Plantar fasciitis of right foot 11/16/2015  . Heel spur 11/16/2015  . Diabetic neuropathy ([place]) 11/16/2015  . Right ankle pain 11/16/2015  . Medication monitoring encounter 11/16/2015  . Chronic pain of multiple joints 08/15/2015  . Abnormal mammogram of right breast 06/19/2015  . Cerumen impaction 03/16/2015  . Hyperlipidemia 12/14/2014  . History of transfusion of packed RBC 12/14/2014  . Hx of smoking 12/14/2014  . Status post bariatric surgery 12/14/2014  . Morbid obesity ([place]) 12/14/2014  . Chronic radicular lumbar pain 12/14/2014  . History of GI  bleed 11/12/2014  . GERD without esophagitis 11/12/2014  . History of small bowel obstruction 09/07/2014    Everlean Alstrom. [name], PT, DPT 02/04/19, 6:58 PM  [facility] [address] [place], [address] Phone: [phone]   Fax:  [phone]  Name: Andrea Holden MRN: [id number] Date of Birth: [birth year]

## 2019-02-09 ENCOUNTER — Other Ambulatory Visit: Payer: Self-pay

## 2019-02-09 ENCOUNTER — Encounter: Payer: Self-pay | Admitting: Physical Therapy

## 2019-02-09 ENCOUNTER — Ambulatory Visit: Payer: Medicare Other | Admitting: Physical Therapy

## 2019-02-09 VITALS — BP 130/58

## 2019-02-09 DIAGNOSIS — R262 Difficulty in walking, not elsewhere classified: Secondary | ICD-10-CM | POA: Diagnosis not present

## 2019-02-09 DIAGNOSIS — G8929 Other chronic pain: Secondary | ICD-10-CM

## 2019-02-09 DIAGNOSIS — M545 Low back pain: Secondary | ICD-10-CM | POA: Diagnosis not present

## 2019-02-09 DIAGNOSIS — Z9181 History of falling: Secondary | ICD-10-CM | POA: Diagnosis not present

## 2019-02-09 DIAGNOSIS — M6281 Muscle weakness (generalized): Secondary | ICD-10-CM | POA: Diagnosis not present

## 2019-02-09 NOTE — Therapy (Signed)
[facility] [address], [place], [address] Phone: [phone]   Fax:  [phone]  Physical Therapy Treatment  Patient Details  Name: Andrea Holden MRN: [id number] Date of Birth: [birth year] Referring Provider (PT): Fredderick Severance, NP    Encounter Date: 02/09/2019  PT End of Session - 02/09/19 1407    Visit Number  20    Number of Visits  33    Date for PT Re-Evaluation  04/20/19    Authorization Type  [facility] Medicare reporting period from 01/26/2019)    Authorization Time Period  Current cert period 12/21/6331 - 03/23/2019 (latest PN: 01/26/2019)    Authorization - Visit Number  3    Authorization - Number of Visits  10    PT Start Time  1355    PT Stop Time  1445    PT Time Calculation (min)  50 min    Activity Tolerance  Patient limited by fatigue;Patient limited by pain    Behavior During Therapy  [facility] for tasks assessed/performed       Past Medical History:  Diagnosis Date  . Allergy    seasonal allergies  . Anemia 2014   needed 5 units of blood d/t passing out, weak  . Anxiety   . Aortic stenosis 12/24/2017   Echo Aug 2018  . Arthritis   . Asthma    allergy induced asthma  . Cardiac murmur 12/12/2017  . Cataract   . Cataract    left  . Complication of anesthesia    arrhythmia following colonoscopy  . Degenerative disc disease, lumbar   . Degenerative disc disease, lumbar   . Depression   . Diabetes mellitus   . Diabetic neuropathy ([place]) 11/16/2015  . Dysrhythmia    stenosis of left ventricle  . GERD (gastroesophageal reflux disease)   . H/O transfusion    patient was given 5 units of blood while at [facility], blood type O+  . History of chicken pox   . History of hiatal hernia   . History of measles, mumps, or rubella   . HOH (hard of hearing)    does not use hearing aides yet  . Hyperlipidemia   . Hypertension   . Irregular heartbeat   . LVH (left ventricular hypertrophy)  12/24/2017   Echo Aug 2018  . Neuropathy   . Opiate use 11/16/2015  . Peripheral arterial disease ([place]) 05/06/2018   At rest, left > right; refer to vasc  . Pes planus of both feet 11/16/2015  . Plantar fasciitis of right foot 11/16/2015  . Wheezing     Past Surgical History:  Procedure Laterality Date  . ABDOMINAL HYSTERECTOMY    . APPLICATION OF WOUND VAC Left 07/23/2018   Procedure: APPLICATION OF WOUND VAC;  Surgeon: Algernon Huxley, MD;  Location: [facility] ORS;  Service: Vascular;  Laterality: Left;  . banck injections    . CATARACT EXTRACTION W/PHACO Right 05/30/2015   Procedure: CATARACT EXTRACTION PHACO AND INTRAOCULAR LENS PLACEMENT (IOC);  Surgeon: Birder Robson, MD;  Location: [facility] ORS;  Service: Ophthalmology;  Laterality: Right;  [place] 00:57 AP% 20.9 CDE 11.99 fluid pack lot #[id number]  . CHOLECYSTECTOMY  1970  . COLON SURGERY  2013   blocked colon  . COLONOSCOPY WITH PROPOFOL N/A 01/20/2018   Procedure: COLONOSCOPY WITH PROPOFOL;  Surgeon: Lucilla Lame, MD;  Location: [facility] ENDOSCOPY;  Service: Endoscopy;  Laterality: N/A;  . ENDARTERECTOMY FEMORAL Left 07/08/2018   Procedure: ENDARTERECTOMY  FEMORAL;  Surgeon: Algernon Huxley, MD;  Location: [facility] ORS;  Service: Vascular;  Laterality: Left;  . EYE SURGERY Right    cataract surgery  . FOOT FUSION Right 2018   metal in foot  . GALLBLADDER SURGERY  1970  . GASTRIC BYPASS  2010   lost 178 lbs and regained 40 lbs last few years  . HUMERUS FRACTURE SURGERY Right    metal plate with screws  . internal bleeding  2016   ulcer in past  . LOWER EXTREMITY ANGIOGRAM Left 07/08/2018   Procedure: LOWER EXTREMITY ANGIOGRAM;  Surgeon: Algernon Huxley, MD;  Location: [facility] ORS;  Service: Vascular;  Laterality: Left;  . LOWER EXTREMITY ANGIOGRAPHY Left 05/27/2018   Procedure: LOWER EXTREMITY ANGIOGRAPHY;  Surgeon: Algernon Huxley, MD;  Location: [facility];  Service: Cardiovascular;  Laterality: Left;  . LOWER EXTREMITY ANGIOGRAPHY Right 12/07/2018    Procedure: LOWER EXTREMITY ANGIOGRAPHY;  Surgeon: Algernon Huxley, MD;  Location: [facility];  Service: Cardiovascular;  Laterality: Right;  . MOUTH SURGERY     root canals and crowns and extractions  . OVARY SURGERY    . SKIN GRAFT Right 2018   RT foot. foot has been rebuilt.  it is full of metal  . TENOTOMY ACHILLES TENDON Right    Percuntaneous. metal in foot  . TONSILLECTOMY    . WOUND DEBRIDEMENT Left 07/23/2018   Procedure: DEBRIDEMENT WOUND;  Surgeon: Algernon Huxley, MD;  Location: [facility] ORS;  Service: Vascular;  Laterality: Left;    Vitals:   02/09/19 1355  BP: (!) 130/58    Subjective Assessment - 02/09/19 1355    Subjective  Patient reports that her pain is high today. Worst spot is in R glute and low back rated 8/10 upon arrival. She states her R knee is really hurting. She reports she had some left groin pain that was like "a catch" and hip popping. She states this has gotten better. She is feeling very discouraged with her ongoing pain. She was also told that she needs all of her bottom teeth. She states she started having pain a few hours after her last treatment session.    Pertinent History  Patient is a 73 y.o. female who presents to outpatient physical therapy with a referral for medical diagnosis of degeneration of lumbar intervertebral disc. This patient's chief complaints consist of chronic low back pain and foot pain, weakness, stiffness, leading to the following functional deficits: difficulty with with basic ADLs, IADLs, ambulation. She used to get injections to help control back pain but it has recently worsened due to being unable to get injections during [place] pandemic. The patient has been informed of current processes in place at [facility] to protect patients from Covid-19 exposure including social distancing, schedule modifications, and new cleaning procedures. After discussing their particular risk with a therapist based on the patient's personal risk  factors, the patient has decided to proceed with in-person therapy. Pt is scheduled for R LE angiography on 12/07/2018. She has recently started smoking again with the stress of COVID19 pandemic and feels strongly she needs to stop. Surgeon has stated in the past he will not complete surgery if she is smoking.  Relevant past medical history and comorbidities include underwent extensive vascular surgeries in Jan 2020 to improve blood flow, similar surgery for R leg scheduled 12/07/2018. Has a history of left carotid artery stenosis, but not enough to have surgery, has a history of multiple arterial surgeries,  history of R cataract surgery, colon surgery, cholecystectomy, R foot skin graft, ovary surgery, gastric bypass, R foot fusion, abdominal hysterectomy; current smoker, plantar fasciitis, peripheral arterial disease, LVH, diabetes mellitus with neuropathy, irregular heartbeat, hiatal hernia, GERD, stenosis of left heart ventricle, depression, lumbar degenerative disc disease, cardiac murmur, asthma, anxiety, obesity, HTN, and seasonal allergies She has been advised not to lift over 10 pounds by physician    Limitations  Sitting;Standing;Walking;House hold activities;Lifting    How long can you sit comfortably?  > 1 hour in almost any chair    How long can you stand comfortably?  4-5 minutes    How long can you walk comfortably?  2 minutes, depends on what she has with her. Able to walk at fair with rollator. To mailbox 120 feet causes shaking in R leg.      Diagnostic tests  MRI lumbar spine report 06/14/2017: "IMPRESSION: 1. Stable degenerative of lumbar spondylosis and scoliosis with multilevel disc disease and facet disease. 2. Stable right foraminal stenosis at L5-S1 due to right-sided disc    Currently in Pain?  Yes    Pain Score  8     Pain Type  Chronic pain    Pain Onset  More than a month ago    Pain Frequency  Constant         TREATMENT:  Therapeutic exercise:to centralize symptoms and  improve ROM, strength, muscular endurance, and activity tolerance required for successful completion of functional activities.  Blood pressure check to assess safety of exercise. Within acceptable limits (see above).   NuStep level1using bilateral upper and lower extremities. Seatsetting 8/handle setting7. For improved extremity mobility, muscular endurance, and activity tolerance; and to induce the analgesic effect of aerobic exercise, stimulate improved joint nutrition, and prepare body structures and systems for following interventions. x83mnuteswith SPM above60during subjective exam.  Seated isometric hip adduction ball, no back support, cuing for improved posture, 5 second hold 2x10. Lateral hip discomfort at 8 reps. Cuing for technique.  Seated isometric hip abduction against strap. No back support, cuing for improved posture 2x10, 5 second holds.   Seated glute set, 5 second hold 2x10. No back support, cuing for improved posture.   Seated marching without back support seated on dynadisc with cuing for improved posture.  sit <>pronewith min A to get to and from prone position in a comfortable position with pillows placed under hips and feet formanual.Patient required prolonged time for bed mobility due to pain, weakness, and fatigue.   Manual therapy:to reduce pain and tissue tension, improve range of motion, neuromodulation, in order to promote improved ability to complete functional activities.  prone with head resting in blue cradle. Pillow under hips and lower legs: STM to R low back and superior fibers of R glutes to help decrease pain and tension. Noted for significant tenderness and reproduction of symptoms in R glute and low back. Reported relief from pain in this region following manual but had more thoracic spine pain after completing prone to sit bed mobility.    PT Education - 02/09/19 1406    Education provided  Yes    Education Details  Exercise  purpose/form. Self management techniques    Person(s) Educated  Patient    Methods  Explanation;Demonstration;Tactile cues;Verbal cues    Comprehension  Verbalized understanding;Returned demonstration;Verbal cues required;Tactile cues required       PT Short Term Goals - 01/26/19 1615      PT SHORT TERM GOAL #1   Title  Be  independent with initial home exercise program for self-management of symptoms.    Baseline  Initial HEP provided at [facility] (11/17/2018); was completing until vascular procedure, re-instated 12/15/2018 (12/15/2018); currently participating (01/26/2019)    Time  2    Period  Weeks    Status  Achieved    Target Date  01/19/19        PT Long Term Goals - 01/26/19 1616      PT LONG TERM GOAL #1   Title  Be independent with a long-term home exercise program for self-management of symptoms.     Baseline  Initial HEP provided at [facility] (11/17/2018); was doing better before vascular surgery, is working at returning to [facility] since procedure (12/15/2018); currently participating but not assigned final long term HEP yet (01/26/2019);    Time  12    Period  Weeks    Status  Partially Met    Target Date  04/20/19      PT LONG TERM GOAL #2   Title  Patient will improve 6MWT to equal or greater than 1000 feet with LRAD to demonstrate improved activity tolerance for community ambulation.     Baseline  6 Minute Walk Test: 200 feet with SPC and supervision. Stopped at 1:43 due to fatigue and leg pain. (11/17/2018); 457 feet SPC, on seated and one standing break (12/15/2018); 547 with SPC, 2 seated and one standing rest break (01/26/2019);    Time  12    Period  Weeks    Status  Partially Met    Target Date  04/20/19      PT LONG TERM GOAL #3   Title  Patient will demonstrate improved 5 Times Sit to Stand test to 11 seconds from chair height with no UE support to demonstrate improvement in LE power and functional strength for daily activities.    Baseline  5 Time Sit to Stand: 27 seconds from  chair height no UE support (11/17/2018); 18 seconds, no UE support from chair-high plinth (12/15/2018); 16 seconds with no UE support from chair (01/26/2019);    Time  12    Period  Weeks    Status  Partially Met    Target Date  04/20/19      PT LONG TERM GOAL #4   Title  Patient will ambulate faster than 1.8ms on the 10 Meter Walk Test to improve ability to cross street safey for community participation.     Baseline  testing deferred to next session (11/17/2018); 1.37 m/sec with SPC (11/24/2018); 1.3 m/sec with SPC (12/15/2018; 01/26/2019);    Time  12    Period  Weeks    Status  Achieved    Target Date  04/20/19      PT LONG TERM GOAL #5   Title  Patient will score equal or greater than  25/30 on the Functional Gait Assessment to demonstrate low fall risk to improve her ability to participate safely in community activities.     Baseline  testing deferred to next session (11/17/2018); 11/30 high fall risk (11/24/2018); unable to measure due to time limitation (12/15/2018); 13/30 high fall risk (12/17/2018); 14/30 high fall risk (01/26/2019);    Time  12    Period  Weeks    Status  Partially Met    Target Date  04/20/19      Additional Long Term Goals   Additional Long Term Goals  Yes      PT LONG TERM GOAL #6   Title  Reduce pain  to equal or less than 4/10 with functional activities to allow patient to complete valued functional tasks such as caring for dogs, grocery shopping, walking with less difficulty.    Baseline  8/10 (01/26/2019);    Time  12    Period  Weeks    Status  New    Target Date  04/20/19            Plan - 02/09/19 1501    Clinical Impression Statement  Patient tolerated treatment well and reported discomfort by end of rep range for hip abduction and adduction that returned to baseline with rest. She took a 5 minute rest mid visit to discuss cooking with another patient. She was positioning on dynadisc during seated exercises to increase challenge to core to improve  postural strength. Patient continues to report temporary relief from manual therapy. She had elevated pain today and lower tolerance for resistance on Nustep but was able to complete isometric hip exercises without lasting increased pain. Pt required multimodal cuing for proper technique and to facilitate improved neuromuscular control, strength, range of motion, and functional ability resulting in improved performance and form. Patient would benefit from continued physical therapy to address remaining impairments and functional limitations to work towards stated goals and return to PLOF or maximal functional independence.    Personal Factors and Comorbidities  Age;Comorbidity 3+;Fitness;Time since onset of injury/illness/exacerbation;Past/Current Experience;Other    Comorbidities  underwent extensive vascular surgeries in Jan 2020 to improve blood flow, similar surgery for R leg scheduled 12/07/2018. Has a history of left carotid artery stenosis, but not enough to have surgery, has a history of multiple arterial surgeries, history of R cataract surgery, colon surgery, cholecystectomy, R foot skin graft, ovary surgery, gastric bypass, R foot fusion, abdominal hysterectomy; current smoker, plantar fasciitis, peripheral arterial disease, LVH, diabetes mellitus with neuropathy, irregular heartbeat, hiatal hernia, GERD, stenosis of left heart ventricle, depression, lumbar degenerative disc disease, cardiac murmur, asthma, anxiety, obesity, HTN, and seasonal allergies She has been advised not to lift over 10 pounds by physician.      Examination-Activity Limitations  Bathing;Carry;Lift;Sit;Stand;Locomotion Level;Toileting;Dressing;Squat;Transfers;Stairs;Hygiene/Grooming;Other    Examination-Participation Restrictions  Community Activity;Volunteer;Cleaning;Interpersonal Relationship;Yard Work;Other    Rehab Potential  Fair    Clinical Impairments Affecting Rehab Potential  (+) motivated (-) muliple co morbidities,  chronic condition    PT Frequency  2x / week    PT Duration  12 weeks    PT Treatment/Interventions  Moist Heat;Patient/family education;Neuromuscular re-education;Therapeutic exercise;Manual techniques;ADLs/Self Care Home Management;Electrical Stimulation;Gait training;Stair training;Functional mobility training;Therapeutic activities;Balance training;Passive range of motion;Dry needling;Energy conservation;Spinal Manipulations;Joint Manipulations;Cryotherapy    PT Next Visit Plan  include manual therapy for pain relief. continue with progressive LE and  functional strengtheing; incorporate postural strengthening    PT Home Exercise Plan  Medbridge Access Code: [email or web address]    Consulted and Agree with Plan of Care  Patient       Patient will benefit from skilled therapeutic intervention in order to improve the following deficits and impairments:  Decreased strength, Impaired flexibility, Decreased activity tolerance, Impaired perceived functional ability, Pain, Decreased endurance, Difficulty walking, Abnormal gait, Decreased knowledge of use of DME, Decreased skin integrity, Decreased range of motion, Impaired sensation, Improper body mechanics, Obesity, Postural dysfunction, Increased edema, Decreased mobility, Decreased balance, Cardiopulmonary status limiting activity, Decreased coordination  Visit Diagnosis: 1. Muscle weakness (generalized)   2. Chronic bilateral low back pain, unspecified whether sciatica present   3. Difficulty in walking, not elsewhere classified   4. History of falling  Problem List Patient Active Problem List   Diagnosis Date Noted  . Lower extremity edema 07/31/2018  . Pressure injury of skin 07/24/2018  . Surgical site infection 07/23/2018  . Wound of left leg 07/23/2018  . Atherosclerosis of artery of extremity with rest pain ([place]) 07/08/2018  . Shortness of breath 06/02/2018  . Bruit 06/02/2018  . Coronary artery disease of native artery of native  heart with stable angina pectoris ([place]) 06/02/2018  . Atherosclerosis of native arteries of extremity with intermittent claudication ([place]) 05/19/2018  . Peripheral arterial disease ([place]) 05/06/2018  . Personal history of colonic polyps   . Aortic stenosis 12/24/2017  . LVH (left ventricular hypertrophy) 12/24/2017  . Hypertension with heart disease 12/24/2017  . Left atrial dilatation 12/12/2017  . Cardiac murmur 12/12/2017  . Gastrojejunal ulcer 12/04/2017  . Hyperphosphatemia 12/04/2017  . Spinal stenosis of lumbar region 06/18/2017  . Degeneration of lumbar intervertebral disc 06/02/2017  . Assistance needed with transportation 05/26/2017  . Financial difficulties 05/26/2017  . Needs assistance with community resources 05/26/2017  . Moderate recurrent major depression ([place]) 05/26/2017  . Non-healing wound of lower extremity 02/06/2017  . Status post ankle arthrodesis 12/11/2016  . Controlled substance agreement broken 10/11/2016  . Low back pain 09/25/2016  . Osteoarthritis of right subtalar joint 07/11/2016  . Posterior tibial tendinitis of right lower extremity 07/11/2016  . Breast cancer screening 06/18/2016  . Knee pain 04/03/2016  . Hyponatremia 03/01/2016  . High triglycerides 12/24/2015  . Pes planus of both feet 11/16/2015  . Plantar fasciitis of right foot 11/16/2015  . Heel spur 11/16/2015  . Diabetic neuropathy ([place]) 11/16/2015  . Right ankle pain 11/16/2015  . Medication monitoring encounter 11/16/2015  . Chronic pain of multiple joints 08/15/2015  . Abnormal mammogram of right breast 06/19/2015  . Cerumen impaction 03/16/2015  . Hyperlipidemia 12/14/2014  . History of transfusion of packed RBC 12/14/2014  . Hx of smoking 12/14/2014  . Status post bariatric surgery 12/14/2014  . Morbid obesity ([place]) 12/14/2014  . Chronic radicular lumbar pain 12/14/2014  . History of GI bleed 11/12/2014  . GERD without esophagitis 11/12/2014  . History of small bowel  obstruction 09/07/2014    Everlean Alstrom. [name], PT, DPT 02/09/19, 3:04 PM  [facility] [address], [place], [address] Phone: [phone]   Fax:  [phone]  Name: Andrea Holden MRN: [id number] Date of Birth: Mar 24, 1946

## 2019-02-10 DIAGNOSIS — M1711 Unilateral primary osteoarthritis, right knee: Secondary | ICD-10-CM | POA: Diagnosis not present

## 2019-02-10 DIAGNOSIS — M1612 Unilateral primary osteoarthritis, left hip: Secondary | ICD-10-CM | POA: Diagnosis not present

## 2019-02-10 DIAGNOSIS — M170 Bilateral primary osteoarthritis of knee: Secondary | ICD-10-CM | POA: Diagnosis not present

## 2019-02-10 DIAGNOSIS — M47896 Other spondylosis, lumbar region: Secondary | ICD-10-CM | POA: Diagnosis not present

## 2019-02-11 ENCOUNTER — Ambulatory Visit: Payer: Medicare Other | Admitting: Physical Therapy

## 2019-02-15 DIAGNOSIS — M545 Low back pain: Secondary | ICD-10-CM | POA: Diagnosis not present

## 2019-02-16 ENCOUNTER — Other Ambulatory Visit: Payer: Self-pay

## 2019-02-16 ENCOUNTER — Encounter: Payer: Self-pay | Admitting: Physical Therapy

## 2019-02-16 ENCOUNTER — Ambulatory Visit: Payer: Medicare Other | Admitting: Physical Therapy

## 2019-02-16 VITALS — BP 164/70

## 2019-02-16 DIAGNOSIS — G8929 Other chronic pain: Secondary | ICD-10-CM

## 2019-02-16 DIAGNOSIS — M6281 Muscle weakness (generalized): Secondary | ICD-10-CM

## 2019-02-16 DIAGNOSIS — Z9181 History of falling: Secondary | ICD-10-CM | POA: Diagnosis not present

## 2019-02-16 DIAGNOSIS — R262 Difficulty in walking, not elsewhere classified: Secondary | ICD-10-CM | POA: Diagnosis not present

## 2019-02-16 DIAGNOSIS — M545 Low back pain: Secondary | ICD-10-CM | POA: Diagnosis not present

## 2019-02-16 NOTE — Therapy (Signed)
[facility] [address] [place], [address] Phone: [phone]   Fax:  [phone]  Physical Therapy Treatment  Patient Details  Name: Andrea Holden MRN: [id number] Date of Birth: Aug 27, 1945 Referring Provider (PT): Fredderick Severance, NP    Encounter Date: 02/16/2019  PT End of Session - 02/16/19 2018    Visit Number  21    Number of Visits  33    Date for PT Re-Evaluation  04/20/19    Authorization Type  [facility] Medicare reporting period from 01/26/2019)    Authorization Time Period  Current cert period 3/47/4259 - 03/23/2019 (latest PN: 01/26/2019)    Authorization - Visit Number  4    Authorization - Number of Visits  10    PT Start Time  1350    PT Stop Time  1435    PT Time Calculation (min)  45 min    Activity Tolerance  Patient limited by pain    Behavior During Therapy  [facility] for tasks assessed/performed       Past Medical History:  Diagnosis Date  . Allergy    seasonal allergies  . Anemia 2014   needed 5 units of blood d/t passing out, weak  . Anxiety   . Aortic stenosis 12/24/2017   Echo Aug 2018  . Arthritis   . Asthma    allergy induced asthma  . Cardiac murmur 12/12/2017  . Cataract   . Cataract    left  . Complication of anesthesia    arrhythmia following colonoscopy  . Degenerative disc disease, lumbar   . Degenerative disc disease, lumbar   . Depression   . Diabetes mellitus   . Diabetic neuropathy ([place]) 11/16/2015  . Dysrhythmia    stenosis of left ventricle  . GERD (gastroesophageal reflux disease)   . H/O transfusion    patient was given 5 units of blood while at [place], blood type O+  . History of chicken pox   . History of hiatal hernia   . History of measles, mumps, or rubella   . HOH (hard of hearing)    does not use hearing aides yet  . Hyperlipidemia   . Hypertension   . Irregular heartbeat   . LVH (left ventricular hypertrophy) 12/24/2017   Echo Aug 2018   . Neuropathy   . Opiate use 11/16/2015  . Peripheral arterial disease ([place]) 05/06/2018   At rest, left > right; refer to vasc  . Pes planus of both feet 11/16/2015  . Plantar fasciitis of right foot 11/16/2015  . Wheezing     Past Surgical History:  Procedure Laterality Date  . ABDOMINAL HYSTERECTOMY    . APPLICATION OF WOUND VAC Left 07/23/2018   Procedure: APPLICATION OF WOUND VAC;  Surgeon: Algernon Huxley, MD;  Location: [facility] ORS;  Service: Vascular;  Laterality: Left;  . banck injections    . CATARACT EXTRACTION W/PHACO Right 05/30/2015   Procedure: CATARACT EXTRACTION PHACO AND INTRAOCULAR LENS PLACEMENT (IOC);  Surgeon: Birder Robson, MD;  Location: [facility] ORS;  Service: Ophthalmology;  Laterality: Right;  [place] 00:57 AP% 20.9 CDE 11.99 fluid pack lot #[id number]  . CHOLECYSTECTOMY  1970  . COLON SURGERY  2013   blocked colon  . COLONOSCOPY WITH PROPOFOL N/A 01/20/2018   Procedure: COLONOSCOPY WITH PROPOFOL;  Surgeon: Lucilla Lame, MD;  Location: [facility] ENDOSCOPY;  Service: Endoscopy;  Laterality: N/A;  . ENDARTERECTOMY FEMORAL Left 07/08/2018   Procedure: ENDARTERECTOMY FEMORAL;  Surgeon:  Algernon Huxley, MD;  Location: [facility] ORS;  Service: Vascular;  Laterality: Left;  . EYE SURGERY Right    cataract surgery  . FOOT FUSION Right 2018   metal in foot  . GALLBLADDER SURGERY  1970  . GASTRIC BYPASS  2010   lost 178 lbs and regained 40 lbs last few years  . HUMERUS FRACTURE SURGERY Right    metal plate with screws  . internal bleeding  2016   ulcer in past  . LOWER EXTREMITY ANGIOGRAM Left 07/08/2018   Procedure: LOWER EXTREMITY ANGIOGRAM;  Surgeon: Algernon Huxley, MD;  Location: [facility] ORS;  Service: Vascular;  Laterality: Left;  . LOWER EXTREMITY ANGIOGRAPHY Left 05/27/2018   Procedure: LOWER EXTREMITY ANGIOGRAPHY;  Surgeon: Algernon Huxley, MD;  Location: [facility];  Service: Cardiovascular;  Laterality: Left;  . LOWER EXTREMITY ANGIOGRAPHY Right 12/07/2018   Procedure: LOWER  EXTREMITY ANGIOGRAPHY;  Surgeon: Algernon Huxley, MD;  Location: [facility];  Service: Cardiovascular;  Laterality: Right;  . MOUTH SURGERY     root canals and crowns and extractions  . OVARY SURGERY    . SKIN GRAFT Right 2018   RT foot. foot has been rebuilt.  it is full of metal  . TENOTOMY ACHILLES TENDON Right    Percuntaneous. metal in foot  . TONSILLECTOMY    . WOUND DEBRIDEMENT Left 07/23/2018   Procedure: DEBRIDEMENT WOUND;  Surgeon: Algernon Huxley, MD;  Location: [facility] ORS;  Service: Vascular;  Laterality: Left;    Vitals:   02/16/19 1419  BP: (!) 164/70    Subjective Assessment - 02/16/19 2015    Subjective  Patient arrived with freind who drove her to physical therapy and assisted her with walking into the clinic. Patient report her pain in her R hip/glute continues to get worse and today it is a 10/10. She saw her orthopedic physician who ordered an MRI and told her to only do soft tissue at physical therapy until the MRI is complete. Patient states she requires assistance to stand up and walk to treatment table. She arrived with her SPC.    Pertinent History  Patient is a 73 y.o. female who presents to outpatient physical therapy with a referral for medical diagnosis of degeneration of lumbar intervertebral disc. This patient's chief complaints consist of chronic low back pain and foot pain, weakness, stiffness, leading to the following functional deficits: difficulty with with basic ADLs, IADLs, ambulation. She used to get injections to help control back pain but it has recently worsened due to being unable to get injections during [place] pandemic. The patient has been informed of current processes in place at Outpatient Rehab to protect patients from Covid-19 exposure including social distancing, schedule modifications, and new cleaning procedures. After discussing their particular risk with a therapist based on the patient's personal risk factors, the patient has decided to  proceed with in-person therapy. Pt is scheduled for R LE angiography on 12/07/2018. She has recently started smoking again with the stress of COVID19 pandemic and feels strongly she needs to stop. Surgeon has stated in the past he will not complete surgery if she is smoking.  Relevant past medical history and comorbidities include underwent extensive vascular surgeries in Jan 2020 to improve blood flow, similar surgery for R leg scheduled 12/07/2018. Has a history of left carotid artery stenosis, but not enough to have surgery, has a history of multiple arterial surgeries, history of R cataract surgery, colon surgery, cholecystectomy, R foot  skin graft, ovary surgery, gastric bypass, R foot fusion, abdominal hysterectomy; current smoker, plantar fasciitis, peripheral arterial disease, LVH, diabetes mellitus with neuropathy, irregular heartbeat, hiatal hernia, GERD, stenosis of left heart ventricle, depression, lumbar degenerative disc disease, cardiac murmur, asthma, anxiety, obesity, HTN, and seasonal allergies She has been advised not to lift over 10 pounds by physician    Limitations  Sitting;Standing;Walking;House hold activities;Lifting    How long can you sit comfortably?  > 1 hour in almost any chair    How long can you stand comfortably?  4-5 minutes    How long can you walk comfortably?  2 minutes, depends on what she has with her. Able to walk at fair with rollator. To mailbox 120 feet causes shaking in R leg.      Diagnostic tests  MRI lumbar spine report 06/14/2017: "IMPRESSION: 1. Stable degenerative of lumbar spondylosis and scoliosis with multilevel disc disease and facet disease. 2. Stable right foraminal stenosis at L5-S1 due to right-sided disc    Currently in Pain?  Yes    Pain Score  10-Worst pain ever    Pain Location  Hip   R glute   Pain Orientation  Right    Pain Descriptors / Indicators  --   "catch"   Pain Type  Chronic pain    Pain Onset  More than a month ago        TREATMENT:  Therapeutic exercise:to centralize symptoms and improve ROM, strength, muscular endurance, and activity tolerance required for successful completion of functional activities.  Blood pressure check due to worsening of condition since last treatment session in backdrop of patients extensive history of vascular disease. Within acceptable limits (see above).   sit <>pronewith min A to get to and from prone position in a comfortable position with pillows placed under hips and feet formanual.Patient required prolonged time for bed mobility due to pain, weakness, and fatigue.   Ambulation to and from waiting room with min A while using SPC due to fear of feeling "catch" in her back/R hip region.   Educated about temporary use of RW for safety while she is so painful.   Manual therapy:to reduce pain and tissue tension, improve range of motion, neuromodulation, in order to promote improved ability to complete functional activities. prone with headresting in blue cradle.Pillow under lower legs: STM to lumbar region and R gluteal region focusing on  R low back and most painful regions of R glutes to help decrease pain and tension. Noted for significant tenderness and reproduction of symptoms in R glute. Reported mild relief from pain in this region following manual and cold pack.   Modalities: to decrease pain and muscle tension - cold pack applied to R lumbar region and R gluteal and posterior hips x 10 minutes. Patient in prone with headresting in blue cradle.Pillow under lower legs.    PT Education - 02/16/19 2017    Education provided  Yes    Education Details  self-management techniques, purpose and use of manual therapy. physical therapy until MRI results    Person(s) Educated  Patient    Methods  Explanation;Demonstration;Tactile cues    Comprehension  Verbalized understanding       PT Short Term Goals - 01/26/19 1615      PT SHORT TERM GOAL #1   Title  Be  independent with initial home exercise program for self-management of symptoms.    Baseline  Initial HEP provided at IE (11/17/2018); was completing until vascular procedure, re-instated  12/15/2018 (12/15/2018); currently participating (01/26/2019)    Time  2    Period  Weeks    Status  Achieved    Target Date  01/19/19        PT Long Term Goals - 01/26/19 1616      PT LONG TERM GOAL #1   Title  Be independent with a long-term home exercise program for self-management of symptoms.     Baseline  Initial HEP provided at IE (11/17/2018); was doing better before vascular surgery, is working at returning to [facility] since procedure (12/15/2018); currently participating but not assigned final long term HEP yet (01/26/2019);    Time  12    Period  Weeks    Status  Partially Met    Target Date  04/20/19      PT LONG TERM GOAL #2   Title  Patient will improve 6MWT to equal or greater than 1000 feet with LRAD to demonstrate improved activity tolerance for community ambulation.     Baseline  6 Minute Walk Test: 200 feet with SPC and supervision. Stopped at 1:43 due to fatigue and leg pain. (11/17/2018); 457 feet SPC, on seated and one standing break (12/15/2018); 547 with SPC, 2 seated and one standing rest break (01/26/2019);    Time  12    Period  Weeks    Status  Partially Met    Target Date  04/20/19      PT LONG TERM GOAL #3   Title  Patient will demonstrate improved 5 Times Sit to Stand test to 11 seconds from chair height with no UE support to demonstrate improvement in LE power and functional strength for daily activities.    Baseline  5 Time Sit to Stand: 27 seconds from chair height no UE support (11/17/2018); 18 seconds, no UE support from chair-high plinth (12/15/2018); 16 seconds with no UE support from chair (01/26/2019);    Time  12    Period  Weeks    Status  Partially Met    Target Date  04/20/19      PT LONG TERM GOAL #4   Title  Patient will ambulate faster than 1.89ms on the 10 Meter Walk  Test to improve ability to cross street safey for community participation.     Baseline  testing deferred to next session (11/17/2018); 1.37 m/sec with SPC (11/24/2018); 1.3 m/sec with SPC (12/15/2018; 01/26/2019);    Time  12    Period  Weeks    Status  Achieved    Target Date  04/20/19      PT LONG TERM GOAL #5   Title  Patient will score equal or greater than  25/30 on the Functional Gait Assessment to demonstrate low fall risk to improve her ability to participate safely in community activities.     Baseline  testing deferred to next session (11/17/2018); 11/30 high fall risk (11/24/2018); unable to measure due to time limitation (12/15/2018); 13/30 high fall risk (12/17/2018); 14/30 high fall risk (01/26/2019);    Time  12    Period  Weeks    Status  Partially Met    Target Date  04/20/19      Additional Long Term Goals   Additional Long Term Goals  Yes      PT LONG TERM GOAL #6   Title  Reduce pain to equal or less than 4/10 with functional activities to allow patient to complete valued functional tasks such as caring for dogs, grocery shopping, walking with less  difficulty.    Baseline  8/10 (01/26/2019);    Time  12    Period  Weeks    Status  New    Target Date  04/20/19            Plan - 02/16/19 2030    Clinical Impression Statement  Patient tolerated treatment fair today with minimal activity due to elevated pain. Discussed and agreed that physical therapy be held at this point until physician reviews results of MRI with patient (MRI yet to be scheduled) or her situation otherwise changes. Patient required assistance with ambulation today and was educated on improved safety with RW compared to [facility] that she is not currently comfortable using independently. Patient reported some mild relief following manual therapy and cold pack. Patient would benefit from continued physical therapy when medically appropriate to address remaining impairments and functional limitations to work  towards stated goals and return to PLOF or maximal functional independence.    Personal Factors and Comorbidities  Age;Comorbidity 3+;Fitness;Time since onset of injury/illness/exacerbation;Past/Current Experience;Other    Comorbidities  underwent extensive vascular surgeries in Jan 2020 to improve blood flow, similar surgery for R leg scheduled 12/07/2018. Has a history of left carotid artery stenosis, but not enough to have surgery, has a history of multiple arterial surgeries, history of R cataract surgery, colon surgery, cholecystectomy, R foot skin graft, ovary surgery, gastric bypass, R foot fusion, abdominal hysterectomy; current smoker, plantar fasciitis, peripheral arterial disease, LVH, diabetes mellitus with neuropathy, irregular heartbeat, hiatal hernia, GERD, stenosis of left heart ventricle, depression, lumbar degenerative disc disease, cardiac murmur, asthma, anxiety, obesity, HTN, and seasonal allergies She has been advised not to lift over 10 pounds by physician.      Examination-Activity Limitations  Bathing;Carry;Lift;Sit;Stand;Locomotion Level;Toileting;Dressing;Squat;Transfers;Stairs;Hygiene/Grooming;Other    Examination-Participation Restrictions  Community Activity;Volunteer;Cleaning;Interpersonal Relationship;Yard Work;Other    Rehab Potential  Fair    Clinical Impairments Affecting Rehab Potential  (+) motivated (-) muliple co morbidities, chronic condition    PT Frequency  2x / week    PT Duration  12 weeks    PT Treatment/Interventions  Moist Heat;Patient/family education;Neuromuscular re-education;Therapeutic exercise;Manual techniques;ADLs/Self Care Home Management;Electrical Stimulation;Gait training;Stair training;Functional mobility training;Therapeutic activities;Balance training;Passive range of motion;Dry needling;Energy conservation;Spinal Manipulations;Joint Manipulations;Cryotherapy    PT Next Visit Plan  hold PT until pt discusses MRI results with physician or her  condition changes    PT Home Exercise Plan  Medbridge Access Code: [email or web address]    Consulted and Agree with Plan of Care  Patient       Patient will benefit from skilled therapeutic intervention in order to improve the following deficits and impairments:  Decreased strength, Impaired flexibility, Decreased activity tolerance, Impaired perceived functional ability, Pain, Decreased endurance, Difficulty walking, Abnormal gait, Decreased knowledge of use of DME, Decreased skin integrity, Decreased range of motion, Impaired sensation, Improper body mechanics, Obesity, Postural dysfunction, Increased edema, Decreased mobility, Decreased balance, Cardiopulmonary status limiting activity, Decreased coordination  Visit Diagnosis: 1. Muscle weakness (generalized)   2. Chronic bilateral low back pain, unspecified whether sciatica present   3. Difficulty in walking, not elsewhere classified   4. History of falling        Problem List Patient Active Problem List   Diagnosis Date Noted  . Lower extremity edema 07/31/2018  . Pressure injury of skin 07/24/2018  . Surgical site infection 07/23/2018  . Wound of left leg 07/23/2018  . Atherosclerosis of artery of extremity with rest pain ([place]) 07/08/2018  . Shortness of breath 06/02/2018  . Bruit  06/02/2018  . Coronary artery disease of native artery of native heart with stable angina pectoris ([place]) 06/02/2018  . Atherosclerosis of native arteries of extremity with intermittent claudication ([place]) 05/19/2018  . Peripheral arterial disease ([place]) 05/06/2018  . Personal history of colonic polyps   . Aortic stenosis 12/24/2017  . LVH (left ventricular hypertrophy) 12/24/2017  . Hypertension with heart disease 12/24/2017  . Left atrial dilatation 12/12/2017  . Cardiac murmur 12/12/2017  . Gastrojejunal ulcer 12/04/2017  . Hyperphosphatemia 12/04/2017  . Spinal stenosis of lumbar region 06/18/2017  . Degeneration of lumbar intervertebral disc 06/02/2017   . Assistance needed with transportation 05/26/2017  . Financial difficulties 05/26/2017  . Needs assistance with community resources 05/26/2017  . Moderate recurrent major depression ([place]) 05/26/2017  . Non-healing wound of lower extremity 02/06/2017  . Status post ankle arthrodesis 12/11/2016  . Controlled substance agreement broken 10/11/2016  . Low back pain 09/25/2016  . Osteoarthritis of right subtalar joint 07/11/2016  . Posterior tibial tendinitis of right lower extremity 07/11/2016  . Breast cancer screening 06/18/2016  . Knee pain 04/03/2016  . Hyponatremia 03/01/2016  . High triglycerides 12/24/2015  . Pes planus of both feet 11/16/2015  . Plantar fasciitis of right foot 11/16/2015  . Heel spur 11/16/2015  . Diabetic neuropathy ([place]) 11/16/2015  . Right ankle pain 11/16/2015  . Medication monitoring encounter 11/16/2015  . Chronic pain of multiple joints 08/15/2015  . Abnormal mammogram of right breast 06/19/2015  . Cerumen impaction 03/16/2015  . Hyperlipidemia 12/14/2014  . History of transfusion of packed RBC 12/14/2014  . Hx of smoking 12/14/2014  . Status post bariatric surgery 12/14/2014  . Morbid obesity ([place]) 12/14/2014  . Chronic radicular lumbar pain 12/14/2014  . History of GI bleed 11/12/2014  . GERD without esophagitis 11/12/2014  . History of small bowel obstruction 09/07/2014    Everlean Alstrom. [name], PT, DPT 02/16/19, 8:31 PM  [facility] [address] [place], [address] Phone: [phone]   Fax:  [phone]  Name: Andrea Holden MRN: [id number] Date of Birth: June 15, 1946

## 2019-02-16 NOTE — Patient Outreach (Signed)
[facility]) Care Management  02/16/2019  Ajahnae Rathgeber Susan [birth year] [phone]   Medication Adherence call to Mrs. [place] Compliant Voice message left with a call back number. Mrs. Oglesby is showing past due on Lisinopril 10 mg and Atorvastatin 10 mg under [place].   [place] Management Direct Dial [phone]  Fax [phone] [email or web address]

## 2019-02-18 ENCOUNTER — Ambulatory Visit: Payer: Medicare Other | Admitting: Physical Therapy

## 2019-02-19 ENCOUNTER — Other Ambulatory Visit: Payer: Self-pay

## 2019-02-19 ENCOUNTER — Ambulatory Visit (INDEPENDENT_AMBULATORY_CARE_PROVIDER_SITE_OTHER): Payer: Medicare Other

## 2019-02-19 DIAGNOSIS — Z599 Problem related to housing and economic circumstances, unspecified: Secondary | ICD-10-CM

## 2019-02-19 DIAGNOSIS — F3341 Major depressive disorder, recurrent, in partial remission: Secondary | ICD-10-CM

## 2019-02-19 DIAGNOSIS — M5136 Other intervertebral disc degeneration, lumbar region: Secondary | ICD-10-CM

## 2019-02-19 DIAGNOSIS — Z598 Other problems related to housing and economic circumstances: Secondary | ICD-10-CM

## 2019-02-19 NOTE — Chronic Care Management (AMB) (Signed)
Chronic Care Management   Follow Up Note   02/19/2019 Name: Andrea MangleDianna J Holden MRN: [id number] DOB: 04-Oct-1945  Referred by: Andrea Holden, Andrea E, NP Reason for referral : Chronic Care Management (follow up)   Subjective: "I just have so much financial stress that I can't quit smoking"   Objective:  Assessment: Andrea J Bromaghimis a 73 y.o.year old femalewho seesElizabeth Holden, NPfor primary care. [facility] Transitions of Care RNasked the CCM team to consult the patient for assistance with chronic disease management related to multiple chronic diseases.   Review of patient status, including review of consultants reports, relevant laboratory and other test results, and collaboration with appropriate care team members and the patient's provider was performed as part of comprehensive patient evaluation and provision of chronic care management services.    Goals Addressed            This Visit's Progress   . COMPLETED: "i want to get back to running in the dog ring" (pt-stated)       Patient has completed revascularization surgery of both legs. She completed HH PT and is now engaged with out patient PT for back pain/sciatica. She is scheduled to have an MRI but is thinking about canceling secondary to co-pay AND states her symptoms have improved. 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 She is ok with this.   Current Barriers:  . Current health status . Lack of stamina and strength . Recent vascular surgery   Nurse Case Manager Clinical Goal(s): Goal revised 01/18/2019 Over the next 30 days, patient will report improvement in mobility secondary to ongoing engagement with PT  Interventions:  . Assessed for continued engagement with PT . Encouraged patient to do daily home exercises as advised by PT . Provided emotional support and reassurance   Patient Self Care Activities:  . Take all medications as prescribed . Continue to engage  with PT twice weekly  Please see past updates in goals section for documentation of goal progression      . I have cut back on my smoking but have not stopped (pt-stated)   Not on track    Patient continues to take Chantix and has reduced her smoking to 1 pk every 3 days. She feels this is as far as she can go with smoking cessation as she is extremely stress over her finances and debt.  Current Barriers:  . Anxiety related to social isolation secondary to covid-19  Nurse Case Manager Clinical Goal(s):  Marland Kitchen. Over the next 30 days, patient will continue reduce her smoking by taking her chantix as prescribed, utilizing 1800 Quit Now  Interventions:  . Assessed for smoking cessation and reason for being unsuccessful . Provided patient with contact information for Consumer Credit Counseling ([facility]) as financial debt is a stressor for her . Discussed reason for choosing to smoke again . Provided emotional support and reassurance . Encouraged patient to utilize 1800 Quit Now (she has used in the past)  Patient Self Care Activities:  . Self administers medications as prescribed . Attends all scheduled provider appointments . Calls pharmacy for medication refills . Attends church or other social activities . Performs ADL's independently . Performs IADL's independently . Calls provider office for new concerns or questions  Please see past updates related to this goal by clicking on the "Past Updates" button in the selected goal          The care  management team will reach out to the patient again over the next 60 days.  The patient has been provided with contact information for the care management team and has been advised to call with any health related questions or concerns.     Andrea Holden Holden. Andrea Rotunda, RN, BSN Nurse Care Coordinator [facility] Care Management  [phone]

## 2019-02-19 NOTE — Patient Instructions (Signed)
Thank you allowing the Chronic Care Management Team to be a part of your care! It was a pleasure speaking with you today!  1. Please contact [facility] at [phone] and ask for the Consumer Credit Counseling department. Tell them you need help with budgeting and alleviating debt strategies.  CCM (Chronic Care Management) Team   Yvone NeuPortia Yanel Dombrosky RN, BSN Nurse Care Coordinator  [phone]  Karalee HeightJulie Hedrick PharmD  Clinical Pharmacist  [phone]) [phone]   Verna Czechhrystal Land, LCSW Clinical Social Worker [phone]) [phone]  Goals Addressed            This Visit's Progress   . COMPLETED: "i want to get back to running in the dog ring" (pt-stated)       Current Barriers:  . Current health status . Lack of stamina and strength . Recent vascular surgery   Nurse Case Manager Clinical Goal(s): Goal revised 01/18/2019 Over the next 30 days, patient will report improvement in mobility secondary to ongoing engagement with PT  Interventions:  . Assessed for continued engagement with PT . Encouraged patient to do daily home exercises as advised by PT . Provided emotional support and reassurance   Patient Self Care Activities:  . Take all medications as prescribed . Continue to engage with PT twice weekly  Please see past updates in goals section for documentation of goal progression      . I have cut back on my smoking but have not stopped (pt-stated)   Not on track    Current Barriers:  . Anxiety related to social isolation secondary to covid-19  Nurse Case Manager Clinical Goal(s):  Marland Kitchen. Over the next 30 days, patient will continue reduce her smoking by taking her chantix as prescribed, utilizing 1800 Quit Now  Interventions:  . Assessed for smoking cessation and reason for being unsuccessful . Provided patient with contact information for Consumer Credit Counseling ([facility]) as financial debt is a stressor for her . Discussed reason for  choosing to smoke again . Provided emotional support and reassurance . Encouraged patient to utilize 1800 Quit Now (she has used in the past)  Patient Self Care Activities:  . Self administers medications as prescribed . Attends all scheduled provider appointments . Calls pharmacy for medication refills . Attends church or other social activities . Performs ADL's independently . Performs IADL's independently . Calls provider office for new concerns or questions  Please see past updates related to this goal by clicking on the "Past Updates" button in the selected goal         The patient verbalized understanding of instructions provided today and declined a print copy of patient instruction materials.   The care management team will reach out to the patient again over the next 60 days.  The patient has been provided with contact information for the care management team and has been advised to call with any health related questions or concerns.   SYMPTOMS OF A STROKE   You have any symptoms of stroke. "BE FAST" is an easy way to remember the main warning signs: ? B - Balance. Signs are dizziness, sudden trouble walking, or loss of balance. ? E - Eyes. Signs are trouble seeing or a sudden change in how you see. ? F - Face. Signs are sudden weakness or loss of feeling of the face, or the face or eyelid drooping on one side. ? A - Arms. Signs are weakness or loss of feeling in an arm.  This happens suddenly and usually on one side of the body. ? S - Speech. Signs are sudden trouble speaking, slurred speech, or trouble understanding what people say. ? T - Time. Time to call emergency services. Write down what time symptoms started.  You have other signs of stroke, such as: ? A sudden, very bad headache with no known cause. ? Feeling sick to your stomach (nausea). ? Throwing up (vomiting). ? Jerky movements you cannot control (seizure).  SYMPTOMS OF A HEART ATTACK  What are the  signs or symptoms? Symptoms of this condition include:  Chest pain. It may feel like: ? Crushing or squeezing. ? Tightness, pressure, fullness, or heaviness.  Pain in the arm, neck, jaw, back, or upper body.  Shortness of breath.  Heartburn.  Indigestion.  Nausea.  Cold sweats.  Feeling tired.  Sudden lightheadedness.

## 2019-02-22 ENCOUNTER — Other Ambulatory Visit: Payer: Self-pay | Admitting: Nurse Practitioner

## 2019-02-22 DIAGNOSIS — I119 Hypertensive heart disease without heart failure: Secondary | ICD-10-CM

## 2019-02-23 ENCOUNTER — Encounter: Payer: Self-pay | Admitting: Physical Therapy

## 2019-02-23 ENCOUNTER — Ambulatory Visit: Payer: Medicare Other | Admitting: Physical Therapy

## 2019-02-23 ENCOUNTER — Other Ambulatory Visit: Payer: Self-pay

## 2019-02-23 DIAGNOSIS — G8929 Other chronic pain: Secondary | ICD-10-CM

## 2019-02-23 DIAGNOSIS — R262 Difficulty in walking, not elsewhere classified: Secondary | ICD-10-CM

## 2019-02-23 DIAGNOSIS — M6281 Muscle weakness (generalized): Secondary | ICD-10-CM

## 2019-02-23 DIAGNOSIS — M545 Low back pain: Secondary | ICD-10-CM | POA: Diagnosis not present

## 2019-02-23 DIAGNOSIS — Z9181 History of falling: Secondary | ICD-10-CM | POA: Diagnosis not present

## 2019-02-23 NOTE — Therapy (Signed)
[facility] [address] [place], [address] Phone: [phone]   Fax:  [phone]  Physical Therapy Treatment  Patient Details  Name: Andrea Holden MRN: [id number] Date of Birth: [birth year] Referring Provider (PT): Fredderick Severance, NP    Encounter Date: 02/23/2019  PT End of Session - 02/23/19 1551    Visit Number  22    Number of Visits  33    Date for PT Re-Evaluation  04/20/19    Authorization Type  [facility] Medicare reporting period from 01/26/2019)    Authorization Time Period  Current cert period 8/61/6837 - 03/23/2019 (latest PN: 01/26/2019)    Authorization - Visit Number  5    Authorization - Number of Visits  10    PT Start Time  1355    PT Stop Time  1430    PT Time Calculation (min)  35 min    Activity Tolerance  Patient limited by pain;Patient tolerated treatment well    Behavior During Therapy  [facility] for tasks assessed/performed       Past Medical History:  Diagnosis Date  . Allergy    seasonal allergies  . Anemia 2014   needed 5 units of blood d/t passing out, weak  . Anxiety   . Aortic stenosis 12/24/2017   Echo Aug 2018  . Arthritis   . Asthma    allergy induced asthma  . Cardiac murmur 12/12/2017  . Cataract   . Cataract    left  . Complication of anesthesia    arrhythmia following colonoscopy  . Degenerative disc disease, lumbar   . Degenerative disc disease, lumbar   . Depression   . Diabetes mellitus   . Diabetic neuropathy ([place]) 11/16/2015  . Dysrhythmia    stenosis of left ventricle  . GERD (gastroesophageal reflux disease)   . H/O transfusion    patient was given 5 units of blood while at [place], blood type O+  . History of chicken pox   . History of hiatal hernia   . History of measles, mumps, or rubella   . HOH (hard of hearing)    does not use hearing aides yet  . Hyperlipidemia   . Hypertension   . Irregular heartbeat   . LVH (left ventricular  hypertrophy) 12/24/2017   Echo Aug 2018  . Neuropathy   . Opiate use 11/16/2015  . Peripheral arterial disease ([place]) 05/06/2018   At rest, left > right; refer to vasc  . Pes planus of both feet 11/16/2015  . Plantar fasciitis of right foot 11/16/2015  . Wheezing     Past Surgical History:  Procedure Laterality Date  . ABDOMINAL HYSTERECTOMY    . APPLICATION OF WOUND VAC Left 07/23/2018   Procedure: APPLICATION OF WOUND VAC;  Surgeon: Algernon Huxley, MD;  Location: [facility] ORS;  Service: Vascular;  Laterality: Left;  . banck injections    . CATARACT EXTRACTION W/PHACO Right 05/30/2015   Procedure: CATARACT EXTRACTION PHACO AND INTRAOCULAR LENS PLACEMENT (IOC);  Surgeon: Birder Robson, MD;  Location: [facility] ORS;  Service: Ophthalmology;  Laterality: Right;  [place] 00:57 AP% 20.9 CDE 11.99 fluid pack lot #[id number]  . CHOLECYSTECTOMY  1970  . COLON SURGERY  2013   blocked colon  . COLONOSCOPY WITH PROPOFOL N/A 01/20/2018   Procedure: COLONOSCOPY WITH PROPOFOL;  Surgeon: Lucilla Lame, MD;  Location: [facility] ENDOSCOPY;  Service: Endoscopy;  Laterality: N/A;  . ENDARTERECTOMY FEMORAL Left 07/08/2018   Procedure: ENDARTERECTOMY  FEMORAL;  Surgeon: Algernon Huxley, MD;  Location: [facility] ORS;  Service: Vascular;  Laterality: Left;  . EYE SURGERY Right    cataract surgery  . FOOT FUSION Right 2018   metal in foot  . GALLBLADDER SURGERY  1970  . GASTRIC BYPASS  2010   lost 178 lbs and regained 40 lbs last few years  . HUMERUS FRACTURE SURGERY Right    metal plate with screws  . internal bleeding  2016   ulcer in past  . LOWER EXTREMITY ANGIOGRAM Left 07/08/2018   Procedure: LOWER EXTREMITY ANGIOGRAM;  Surgeon: Algernon Huxley, MD;  Location: [facility] ORS;  Service: Vascular;  Laterality: Left;  . LOWER EXTREMITY ANGIOGRAPHY Left 05/27/2018   Procedure: LOWER EXTREMITY ANGIOGRAPHY;  Surgeon: Algernon Huxley, MD;  Location: [place] [facility];  Service: Cardiovascular;  Laterality: Left;  . LOWER EXTREMITY ANGIOGRAPHY  Right 12/07/2018   Procedure: LOWER EXTREMITY ANGIOGRAPHY;  Surgeon: Algernon Huxley, MD;  Location: [place] [facility];  Service: Cardiovascular;  Laterality: Right;  . MOUTH SURGERY     root canals and crowns and extractions  . OVARY SURGERY    . SKIN GRAFT Right 2018   RT foot. foot has been rebuilt.  it is full of metal  . TENOTOMY ACHILLES TENDON Right    Percuntaneous. metal in foot  . TONSILLECTOMY    . WOUND DEBRIDEMENT Left 07/23/2018   Procedure: DEBRIDEMENT WOUND;  Surgeon: Algernon Huxley, MD;  Location: [facility] ORS;  Service: Vascular;  Laterality: Left;    There were no vitals filed for this visit.  Subjective Assessment - 02/23/19 1403    Subjective  Patient reports she is feeling better today and the catch in her R hip/glute is not longer there.She thinks the soft tissue work from last session helped. She has an MRI scheduled near sep 14th. States it was scheduled earlier but she had to delay it in order to pay for it. She states her doctor said she could start doing a bit more active care if well tolerated but feels she responded well to the [facility] last treatment session. Reports 5/10 pain in low back but not in glutes today.    Pertinent History  Patient is a 73 y.o. female who presents to outpatient physical therapy with a referral for medical diagnosis of degeneration of lumbar intervertebral disc. This patient's chief complaints consist of chronic low back pain and foot pain, weakness, stiffness, leading to the following functional deficits: difficulty with with basic ADLs, IADLs, ambulation. She used to get injections to help control back pain but it has recently worsened due to being unable to get injections during [place] pandemic. The patient has been informed of current processes in place at [facility] to protect patients from Covid-19 exposure including social distancing, schedule modifications, and new cleaning procedures. After discussing their particular risk with a therapist  based on the patient's personal risk factors, the patient has decided to proceed with in-person therapy. Pt is scheduled for R LE angiography on 12/07/2018. She has recently started smoking again with the stress of COVID19 pandemic and feels strongly she needs to stop. Surgeon has stated in the past he will not complete surgery if she is smoking.  Relevant past medical history and comorbidities include underwent extensive vascular surgeries in Jan 2020 to improve blood flow, similar surgery for R leg scheduled 12/07/2018. Has a history of left carotid artery stenosis, but not enough to have surgery, has a history of multiple  arterial surgeries, history of R cataract surgery, colon surgery, cholecystectomy, R foot skin graft, ovary surgery, gastric bypass, R foot fusion, abdominal hysterectomy; current smoker, plantar fasciitis, peripheral arterial disease, LVH, diabetes mellitus with neuropathy, irregular heartbeat, hiatal hernia, GERD, stenosis of left heart ventricle, depression, lumbar degenerative disc disease, cardiac murmur, asthma, anxiety, obesity, HTN, and seasonal allergies She has been advised not to lift over 10 pounds by physician    Limitations  Sitting;Standing;Walking;House hold activities;Lifting    How long can you sit comfortably?  > 1 hour in almost any chair    How long can you stand comfortably?  4-5 minutes    How long can you walk comfortably?  2 minutes, depends on what she has with her. Able to walk at fair with rollator. To mailbox 120 feet causes shaking in R leg.      Diagnostic tests  MRI lumbar spine report 06/14/2017: "IMPRESSION: 1. Stable degenerative of lumbar spondylosis and scoliosis with multilevel disc disease and facet disease. 2. Stable right foraminal stenosis at L5-S1 due to right-sided disc    Currently in Pain?  Yes    Pain Score  5     Pain Location  Back    Pain Onset  More than a month ago       TREATMENT:  Therapeutic exercise:to centralize symptoms and  improve ROM, strength, muscular endurance, and activity tolerance required for successful completion of functional activities.  Blood pressure check to assess safety of exercise. Within acceptable limits (see above).    NuStep level2using bilateral upper and lower extremities. Seatsetting 8/handle setting7. For improved extremity mobility, muscular endurance, and activity tolerance; and to induce the analgesic effect of aerobic exercise, stimulate improved joint nutrition, and prepare body structures and systems for following interventions. x23mnuteswith SPM above60during subjective exam.  Seated isometric hip adductionball, no back support, cuing for improved posture, 5 second hold 2x10. Lateral hip discomfort at 8 reps. Cuing for technique.   Seated isometric hip abduction against strap. No back support, cuing for improved posture 2x10, 5 second holds.  sit <>pronewith min A to get to and from prone position in a comfortable position with pillows placed under hips and feet formanual.Patient required prolonged time for bed mobility due to pain, weakness, and fatigue.   Manual therapy:to reduce pain and tissue tension, improve range of motion, neuromodulation, in order to promote improved ability to complete functional activities.  prone with headresting in blue cradle.Pillow under lower legs: STM to R low back and superior fibers of R glutes to help decrease pain and tension. Noted for significant tenderness and reproduction of symptoms in R glute and low back. Reported relief from pain in this region following manual but had more L sided back tenderness following.    PT Education - 02/23/19 1551    Education provided  Yes    Education Details  Exercise purpose/form. Self management techniques    Person(s) Educated  Patient    Methods  Explanation;Demonstration    Comprehension  Verbalized understanding;Returned demonstration       PT Short Term Goals - 01/26/19 1615       PT SHORT TERM GOAL #1   Title  Be independent with initial home exercise program for self-management of symptoms.    Baseline  Initial HEP provided at IE (11/17/2018); was completing until vascular procedure, re-instated 12/15/2018 (12/15/2018); currently participating (01/26/2019)    Time  2    Period  Weeks    Status  Achieved  Target Date  01/19/19        PT Long Term Goals - 01/26/19 1616      PT LONG TERM GOAL #1   Title  Be independent with a long-term home exercise program for self-management of symptoms.     Baseline  Initial HEP provided at IE (11/17/2018); was doing better before vascular surgery, is working at returning to [facility] since procedure (12/15/2018); currently participating but not assigned final long term HEP yet (01/26/2019);    Time  12    Period  Weeks    Status  Partially Met    Target Date  04/20/19      PT LONG TERM GOAL #2   Title  Patient will improve 6MWT to equal or greater than 1000 feet with LRAD to demonstrate improved activity tolerance for community ambulation.     Baseline  6 Minute Walk Test: 200 feet with SPC and supervision. Stopped at 1:43 due to fatigue and leg pain. (11/17/2018); 457 feet SPC, on seated and one standing break (12/15/2018); 547 with SPC, 2 seated and one standing rest break (01/26/2019);    Time  12    Period  Weeks    Status  Partially Met    Target Date  04/20/19      PT LONG TERM GOAL #3   Title  Patient will demonstrate improved 5 Times Sit to Stand test to 11 seconds from chair height with no UE support to demonstrate improvement in LE power and functional strength for daily activities.    Baseline  5 Time Sit to Stand: 27 seconds from chair height no UE support (11/17/2018); 18 seconds, no UE support from chair-high plinth (12/15/2018); 16 seconds with no UE support from chair (01/26/2019);    Time  12    Period  Weeks    Status  Partially Met    Target Date  04/20/19      PT LONG TERM GOAL #4   Title  Patient will  ambulate faster than 1.85ms on the 10 Meter Walk Test to improve ability to cross street safey for community participation.     Baseline  testing deferred to next session (11/17/2018); 1.37 m/sec with SPC (11/24/2018); 1.3 m/sec with SPC (12/15/2018; 01/26/2019);    Time  12    Period  Weeks    Status  Achieved    Target Date  04/20/19      PT LONG TERM GOAL #5   Title  Patient will score equal or greater than  25/30 on the Functional Gait Assessment to demonstrate low fall risk to improve her ability to participate safely in community activities.     Baseline  testing deferred to next session (11/17/2018); 11/30 high fall risk (11/24/2018); unable to measure due to time limitation (12/15/2018); 13/30 high fall risk (12/17/2018); 14/30 high fall risk (01/26/2019);    Time  12    Period  Weeks    Status  Partially Met    Target Date  04/20/19      Additional Long Term Goals   Additional Long Term Goals  Yes      PT LONG TERM GOAL #6   Title  Reduce pain to equal or less than 4/10 with functional activities to allow patient to complete valued functional tasks such as caring for dogs, grocery shopping, walking with less difficulty.    Baseline  8/10 (01/26/2019);    Time  12    Period  Weeks    Status  New  Target Date  04/20/19            Plan - 02/23/19 1556    Clinical Impression Statement  Patient was 10 minutes late for her session but was feeling much better than prior session and was able to ambulate independently and return to some exercise. NuStep was well tolerated and pt was encouraged to continue to 10 minutes to improve her activity level and activity tolerance. Patient easily distracted this session and required multiple breaks and redirection to stay focused on exercises. Patient reported feeling she benefited from manual therapy although it was a bit uncomfortable while it was applied. Patient continues to have significant pain, stiffness, activity tolerance,  weakness/power/endurance deficits that limiter her ability to complete ADLs, IADLs, and usual community activities. Patient would benefit from continued physical therapy to address remaining impairments and functional limitations to work towards stated goals and return to PLOF or maximal functional independence.    Personal Factors and Comorbidities  Age;Comorbidity 3+;Fitness;Time since onset of injury/illness/exacerbation;Past/Current Experience;Other    Comorbidities  underwent extensive vascular surgeries in Jan 2020 to improve blood flow, similar surgery for R leg scheduled 12/07/2018. Has a history of left carotid artery stenosis, but not enough to have surgery, has a history of multiple arterial surgeries, history of R cataract surgery, colon surgery, cholecystectomy, R foot skin graft, ovary surgery, gastric bypass, R foot fusion, abdominal hysterectomy; current smoker, plantar fasciitis, peripheral arterial disease, LVH, diabetes mellitus with neuropathy, irregular heartbeat, hiatal hernia, GERD, stenosis of left heart ventricle, depression, lumbar degenerative disc disease, cardiac murmur, asthma, anxiety, obesity, HTN, and seasonal allergies She has been advised not to lift over 10 pounds by physician.      Examination-Activity Limitations  Bathing;Carry;Lift;Sit;Stand;Locomotion Level;Toileting;Dressing;Squat;Transfers;Stairs;Hygiene/Grooming;Other    Examination-Participation Restrictions  Community Activity;Volunteer;Cleaning;Interpersonal Relationship;Yard Work;Other    Rehab Potential  Fair    Clinical Impairments Affecting Rehab Potential  (+) motivated (-) muliple co morbidities, chronic condition    PT Frequency  2x / week    PT Duration  12 weeks    PT Treatment/Interventions  Moist Heat;Patient/family education;Neuromuscular re-education;Therapeutic exercise;Manual techniques;ADLs/Self Care Home Management;Electrical Stimulation;Gait training;Stair training;Functional mobility  training;Therapeutic activities;Balance training;Passive range of motion;Dry needling;Energy conservation;Spinal Manipulations;Joint Manipulations;Cryotherapy    PT Next Visit Plan  continue manual and strengthening activities as tolerated    PT Home Exercise Plan  Medbridge Access Code: [email or web address]    Consulted and Agree with Plan of Care  Patient       Patient will benefit from skilled therapeutic intervention in order to improve the following deficits and impairments:  Decreased strength, Impaired flexibility, Decreased activity tolerance, Impaired perceived functional ability, Pain, Decreased endurance, Difficulty walking, Abnormal gait, Decreased knowledge of use of DME, Decreased skin integrity, Decreased range of motion, Impaired sensation, Improper body mechanics, Obesity, Postural dysfunction, Increased edema, Decreased mobility, Decreased balance, Cardiopulmonary status limiting activity, Decreased coordination  Visit Diagnosis: Muscle weakness (generalized)  Chronic bilateral low back pain, unspecified whether sciatica present  Difficulty in walking, not elsewhere classified  History of falling     Problem List Patient Active Problem List   Diagnosis Date Noted  . Lower extremity edema 07/31/2018  . Pressure injury of skin 07/24/2018  . Surgical site infection 07/23/2018  . Wound of left leg 07/23/2018  . Atherosclerosis of artery of extremity with rest pain ([place]) 07/08/2018  . Shortness of breath 06/02/2018  . Bruit 06/02/2018  . Coronary artery disease of native artery of native heart with stable angina pectoris ([place]) 06/02/2018  .  Atherosclerosis of native arteries of extremity with intermittent claudication ([place]) 05/19/2018  . Peripheral arterial disease ([place]) 05/06/2018  . Personal history of colonic polyps   . Aortic stenosis 12/24/2017  . LVH (left ventricular hypertrophy) 12/24/2017  . Hypertension with heart disease 12/24/2017  . Left atrial dilatation  12/12/2017  . Cardiac murmur 12/12/2017  . Gastrojejunal ulcer 12/04/2017  . Hyperphosphatemia 12/04/2017  . Spinal stenosis of lumbar region 06/18/2017  . Degeneration of lumbar intervertebral disc 06/02/2017  . Assistance needed with transportation 05/26/2017  . Financial difficulties 05/26/2017  . Needs assistance with community resources 05/26/2017  . Moderate recurrent major depression ([place]) 05/26/2017  . Non-healing wound of lower extremity 02/06/2017  . Status post ankle arthrodesis 12/11/2016  . Controlled substance agreement broken 10/11/2016  . Low back pain 09/25/2016  . Osteoarthritis of right subtalar joint 07/11/2016  . Posterior tibial tendinitis of right lower extremity 07/11/2016  . Breast cancer screening 06/18/2016  . Knee pain 04/03/2016  . Hyponatremia 03/01/2016  . High triglycerides 12/24/2015  . Pes planus of both feet 11/16/2015  . Plantar fasciitis of right foot 11/16/2015  . Heel spur 11/16/2015  . Diabetic neuropathy ([place]) 11/16/2015  . Right ankle pain 11/16/2015  . Medication monitoring encounter 11/16/2015  . Chronic pain of multiple joints 08/15/2015  . Abnormal mammogram of right breast 06/19/2015  . Cerumen impaction 03/16/2015  . Hyperlipidemia 12/14/2014  . History of transfusion of packed RBC 12/14/2014  . Hx of smoking 12/14/2014  . Status post bariatric surgery 12/14/2014  . Morbid obesity ([place]) 12/14/2014  . Chronic radicular lumbar pain 12/14/2014  . History of GI bleed 11/12/2014  . GERD without esophagitis 11/12/2014  . History of small bowel obstruction 09/07/2014    Everlean Alstrom. [name], PT, DPT 02/23/19, 3:57 PM  [facility] [address] [place], [address] Phone: [phone]   Fax:  [phone]  Name: Andrea Holden MRN: [id number] Date of Birth: 12-21-1945

## 2019-02-25 ENCOUNTER — Other Ambulatory Visit: Payer: Self-pay

## 2019-02-25 ENCOUNTER — Ambulatory Visit: Payer: Medicare Other | Admitting: Physical Therapy

## 2019-02-25 ENCOUNTER — Encounter: Payer: Self-pay | Admitting: Physical Therapy

## 2019-02-25 DIAGNOSIS — R262 Difficulty in walking, not elsewhere classified: Secondary | ICD-10-CM

## 2019-02-25 DIAGNOSIS — M545 Low back pain, unspecified: Secondary | ICD-10-CM

## 2019-02-25 DIAGNOSIS — M6281 Muscle weakness (generalized): Secondary | ICD-10-CM

## 2019-02-25 DIAGNOSIS — Z9181 History of falling: Secondary | ICD-10-CM

## 2019-02-25 DIAGNOSIS — G8929 Other chronic pain: Secondary | ICD-10-CM

## 2019-02-25 NOTE — Therapy (Signed)
[facility] [address], [place], [address] Phone: [phone]   Fax:  [phone]  Physical Therapy Treatment  Patient Details  Name: Andrea Holden MRN: [id number] Date of Birth: [birth year] Referring Provider (PT): Fredderick Severance, NP    Encounter Date: 02/25/2019  PT End of Session - 02/25/19 1359    Visit Number  23    Number of Visits  33    Date for PT Re-Evaluation  04/20/19    Authorization Type  [facility] Medicare reporting period from 01/26/2019)    Authorization Time Period  Current cert period 2/37/6283 - 03/23/2019 (latest PN: 01/26/2019)    Authorization - Visit Number  6    Authorization - Number of Visits  10    PT Start Time  1350    PT Stop Time  1430    PT Time Calculation (min)  40 min    Activity Tolerance  Patient limited by pain;Patient tolerated treatment well    Behavior During Therapy  [facility] for tasks assessed/performed       Past Medical History:  Diagnosis Date  . Allergy    seasonal allergies  . Anemia 2014   needed 5 units of blood d/t passing out, weak  . Anxiety   . Aortic stenosis 12/24/2017   Echo Aug 2018  . Arthritis   . Asthma    allergy induced asthma  . Cardiac murmur 12/12/2017  . Cataract   . Cataract    left  . Complication of anesthesia    arrhythmia following colonoscopy  . Degenerative disc disease, lumbar   . Degenerative disc disease, lumbar   . Depression   . Diabetes mellitus   . Diabetic neuropathy ([place]) 11/16/2015  . Dysrhythmia    stenosis of left ventricle  . GERD (gastroesophageal reflux disease)   . H/O transfusion    patient was [facility] 5 [facility] of [facility], blood type O+  . History of chicken pox   . History of hiatal hernia   . History of measles, mumps, or rubella   . HOH (hard of hearing)    does not use hearing aides yet  . Hyperlipidemia   . Hypertension   . Irregular heartbeat   . LVH (left ventricular  hypertrophy) 12/24/2017   Echo Aug 2018  . Neuropathy   . Opiate use 11/16/2015  . Peripheral arterial disease ([place]) 05/06/2018   At rest, left > right; refer to vasc  . Pes planus of both feet 11/16/2015  . Plantar fasciitis of right foot 11/16/2015  . Wheezing     Past Surgical History:  Procedure Laterality Date  . ABDOMINAL HYSTERECTOMY    . APPLICATION OF WOUND VAC Left 07/23/2018   Procedure: APPLICATION OF WOUND VAC;  Surgeon: Algernon Huxley, MD;  Location: [facility] ORS;  Service: Vascular;  Laterality: Left;  . banck injections    . CATARACT EXTRACTION W/PHACO Right 05/30/2015   Procedure: CATARACT EXTRACTION PHACO AND INTRAOCULAR LENS PLACEMENT (IOC);  Surgeon: Birder Robson, MD;  Location: [facility] ORS;  Service: Ophthalmology;  Laterality: Right;  [place] 00:57 AP% 20.9 CDE 11.99 fluid pack lot #[id number]  . CHOLECYSTECTOMY  1970  . COLON SURGERY  2013   blocked colon  . COLONOSCOPY WITH PROPOFOL N/A 01/20/2018   Procedure: COLONOSCOPY WITH PROPOFOL;  Surgeon: Lucilla Lame, MD;  Location: [facility] ENDOSCOPY;  Service: Endoscopy;  Laterality: N/A;  . ENDARTERECTOMY FEMORAL Left 07/08/2018   Procedure: ENDARTERECTOMY  FEMORAL;  Surgeon: Algernon Huxley, MD;  Location: [facility] ORS;  Service: Vascular;  Laterality: Left;  . EYE SURGERY Right    cataract surgery  . FOOT FUSION Right 2018   metal in foot  . GALLBLADDER SURGERY  1970  . GASTRIC BYPASS  2010   lost 178 lbs and regained 40 lbs last few years  . HUMERUS FRACTURE SURGERY Right    metal plate with screws  . internal bleeding  2016   ulcer in past  . LOWER EXTREMITY ANGIOGRAM Left 07/08/2018   Procedure: LOWER EXTREMITY ANGIOGRAM;  Surgeon: Algernon Huxley, MD;  Location: [facility] ORS;  Service: Vascular;  Laterality: Left;  . LOWER EXTREMITY ANGIOGRAPHY Left 05/27/2018   Procedure: LOWER EXTREMITY ANGIOGRAPHY;  Surgeon: Algernon Huxley, MD;  Location: [facility];  Service: Cardiovascular;  Laterality: Left;  . LOWER EXTREMITY ANGIOGRAPHY  Right 12/07/2018   Procedure: LOWER EXTREMITY ANGIOGRAPHY;  Surgeon: Algernon Huxley, MD;  Location: [facility];  Service: Cardiovascular;  Laterality: Right;  . MOUTH SURGERY     root canals and crowns and extractions  . OVARY SURGERY    . SKIN GRAFT Right 2018   RT foot. foot has been rebuilt.  it is full of metal  . TENOTOMY ACHILLES TENDON Right    Percuntaneous. metal in foot  . TONSILLECTOMY    . WOUND DEBRIDEMENT Left 07/23/2018   Procedure: DEBRIDEMENT WOUND;  Surgeon: Algernon Huxley, MD;  Location: [facility] ORS;  Service: Vascular;  Laterality: Left;    There were no vitals filed for this visit.  Subjective Assessment - 02/25/19 1356    Subjective  Patinet reports she is feeling pretty good today with 4.5/10 pain in her low back. She brought in radiograph images that show changes in at the patellas, hip joints, and lower lumbar region. States she felt okay since last treatment session.    Pertinent History  Patient is a 73 y.o. female who presents to outpatient physical therapy with a referral for medical diagnosis of degeneration of lumbar intervertebral disc. This patient's chief complaints consist of chronic low back pain and foot pain, weakness, stiffness, leading to the following functional deficits: difficulty with with basic ADLs, IADLs, ambulation. She used to get injections to help control back pain but it has recently worsened due to being unable to get injections during [place] pandemic. The patient has been informed of current processes in place at [facility] to protect patients from Covid-19 exposure including social distancing, schedule modifications, and new cleaning procedures. After discussing their particular risk with a therapist based on the patient's personal risk factors, the patient has decided to proceed with in-person therapy. Pt is scheduled for R LE angiography on 12/07/2018. She has recently started smoking again with the stress of COVID19 pandemic and feels  strongly she needs to stop. Surgeon has stated in the past he will not complete surgery if she is smoking.  Relevant past medical history and comorbidities include underwent extensive vascular surgeries in Jan 2020 to improve blood flow, similar surgery for R leg scheduled 12/07/2018. Has a history of left carotid artery stenosis, but not enough to have surgery, has a history of multiple arterial surgeries, history of R cataract surgery, colon surgery, cholecystectomy, R foot skin graft, ovary surgery, gastric bypass, R foot fusion, abdominal hysterectomy; current smoker, plantar fasciitis, peripheral arterial disease, LVH, diabetes mellitus with neuropathy, irregular heartbeat, hiatal hernia, GERD, stenosis of left heart ventricle, depression, lumbar degenerative disc disease,  cardiac murmur, asthma, anxiety, obesity, HTN, and seasonal allergies She has been advised not to lift over 10 pounds by physician    Limitations  Sitting;Standing;Walking;House hold activities;Lifting    How long can you sit comfortably?  > 1 hour in almost any chair    How long can you stand comfortably?  4-5 minutes    How long can you walk comfortably?  2 minutes, depends on what she has with her. Able to walk at fair with rollator. To mailbox 120 feet causes shaking in R leg.      Diagnostic tests  MRI lumbar spine report 06/14/2017: "IMPRESSION: 1. Stable degenerative of lumbar spondylosis and scoliosis with multilevel disc disease and facet disease. 2. Stable right foraminal stenosis at L5-S1 due to right-sided disc    Currently in Pain?  Yes    Pain Score  5     Pain Location  Back    Pain Onset  More than a month ago       TREATMENT:  Therapeutic exercise:to centralize symptoms and improve ROM, strength, muscular endurance, and activity tolerance required for successful completion of functional activities.  NuStep level2 (first 4 min on lvl 3 prior to feeling it was too hard)using bilateral upper and lower  extremities. Seatsetting 8/handle setting7. For improved extremity mobility, muscular endurance, and activity tolerance; and to induce the analgesic effect of aerobic exercise, stimulate improved joint nutrition, and prepare body structures and systems for following interventions. x22mnutes. Instructed to attempt to keep SPM above60during subjective exam. Average SPM = 59, 0.33 miles.   Seated isometric hip adductionball, no back support, cuing for improved posture, 5 second hold3 minutes.Lateral hip discomfort at 8 reps.Cuing for technique.   Seated isometric hip abduction against strap. No back support, cuing for improved posture x 3 min. 5 second holds.  Seated pallof press (multifidus press) with red theraband 2x10 each side. Cuing for trunk control and technique.   sit <>pronewith min A to get to and from prone position in a comfortable position with pillows placed under hips and feet formanual.Patient required prolonged timefor bed mobilitydue to pain, weakness, and fatigue.   Seated ball strikes holding cane in both hands horizontally and tapping small green children's ball back to clinician who tosses ball at patient while seated on dynadisc with trunk unsupported at edge of low plinth. To improve trunk control and coordination during moderately unpredictable task environment. Patient enjoyed task and did not report any increase in pain. Demo good activation of trunk muscles.   Manual therapy:to reduce pain and tissue tension, improve range of motion, neuromodulation, in order to promote improved ability to complete functional activities.  prone with headresting in blue cradle.Pillow under lower legs: STM toR low back and lower thoracic spineto help decrease pain and tension. Noted for significant tenderness and reproduction of symptoms at lowest segments of lumbar spine. Reported some increased soreness in this region following manual, but felt it would overall feel  better after acute soreness wears off.    PT Education - 02/25/19 1358    Education provided  Yes    Education Details  Exercise purpose/form. Self management techniques    Person(s) Educated  Patient    Methods  Explanation;Demonstration    Comprehension  Verbalized understanding;Returned demonstration       PT Short Term Goals - 01/26/19 1615      PT SHORT TERM GOAL #1   Title  Be independent with initial home exercise program for self-management of symptoms.  Baseline  Initial HEP provided at [facility] (11/17/2018); was completing until vascular procedure, re-instated 12/15/2018 (12/15/2018); currently participating (01/26/2019)    Time  2    Period  Weeks    Status  Achieved    Target Date  01/19/19        PT Long Term Goals - 01/26/19 1616      PT LONG TERM GOAL #1   Title  Be independent with a long-term home exercise program for self-management of symptoms.     Baseline  Initial HEP provided at [facility] (11/17/2018); was doing better before vascular surgery, is working at returning to [facility] since procedure (12/15/2018); currently participating but not assigned final long term HEP yet (01/26/2019);    Time  12    Period  Weeks    Status  Partially Met    Target Date  04/20/19      PT LONG TERM GOAL #2   Title  Patient will improve 6MWT to equal or greater than 1000 feet with LRAD to demonstrate improved activity tolerance for community ambulation.     Baseline  6 Minute Walk Test: 200 feet with SPC and supervision. Stopped at 1:43 due to fatigue and leg pain. (11/17/2018); 457 feet SPC, on seated and one standing break (12/15/2018); 547 with SPC, 2 seated and one standing rest break (01/26/2019);    Time  12    Period  Weeks    Status  Partially Met    Target Date  04/20/19      PT LONG TERM GOAL #3   Title  Patient will demonstrate improved 5 Times Sit to Stand test to 11 seconds from chair height with no UE support to demonstrate improvement in LE power and functional strength for daily  activities.    Baseline  5 Time Sit to Stand: 27 seconds from chair height no UE support (11/17/2018); 18 seconds, no UE support from chair-high plinth (12/15/2018); 16 seconds with no UE support from chair (01/26/2019);    Time  12    Period  Weeks    Status  Partially Met    Target Date  04/20/19      PT LONG TERM GOAL #4   Title  Patient will ambulate faster than 1.14ms on the 10 Meter Walk Test to improve ability to cross street safey for community participation.     Baseline  testing deferred to next session (11/17/2018); 1.37 m/sec with SPC (11/24/2018); 1.3 m/sec with SPC (12/15/2018; 01/26/2019);    Time  12    Period  Weeks    Status  Achieved    Target Date  04/20/19      PT LONG TERM GOAL #5   Title  Patient will score equal or greater than  25/30 on the Functional Gait Assessment to demonstrate low fall risk to improve her ability to participate safely in community activities.     Baseline  testing deferred to next session (11/17/2018); 11/30 high fall risk (11/24/2018); unable to measure due to time limitation (12/15/2018); 13/30 high fall risk (12/17/2018); 14/30 high fall risk (01/26/2019);    Time  12    Period  Weeks    Status  Partially Met    Target Date  04/20/19      Additional Long Term Goals   Additional Long Term Goals  Yes      PT LONG TERM GOAL #6   Title  Reduce pain to equal or less than 4/10 with functional activities to allow patient to  complete valued functional tasks such as caring for dogs, grocery shopping, walking with less difficulty.    Baseline  8/10 (01/26/2019);    Time  12    Period  Weeks    Status  New    Target Date  04/20/19            Plan - 02/26/19 1041    Clinical Impression Statement  Patient tolerated treatment well today and was able to progress to more gentle hip and trunk exercises with good tolerance. Continues to demonstrate good tolerance for NuStep including with increased resistance for part of activity. Felt she got too fatigued  to continue level 3 for more than 4 minutes. Continues to demonstrate low activity tolerance and core strength. Requires frequent re-direction to stay on task. In better spirits today. Patient continues to have significant pain, stiffness, activity tolerance, weakness/power/endurance deficits that limiter her ability to complete ADLs, IADLs, and usual community activities. Patient would benefit from continued physical therapy to address remaining impairments and functional limitations to work towards stated goals and return to PLOF or maximal functional independence.    Personal Factors and Comorbidities  Age;Comorbidity 3+;Fitness;Time since onset of injury/illness/exacerbation;Past/Current Experience;Other    Comorbidities  underwent extensive vascular surgeries in Jan 2020 to improve blood flow, similar surgery for R leg scheduled 12/07/2018. Has a history of left carotid artery stenosis, but not enough to have surgery, has a history of multiple arterial surgeries, history of R cataract surgery, colon surgery, cholecystectomy, R foot skin graft, ovary surgery, gastric bypass, R foot fusion, abdominal hysterectomy; current smoker, plantar fasciitis, peripheral arterial disease, LVH, diabetes mellitus with neuropathy, irregular heartbeat, hiatal hernia, GERD, stenosis of left heart ventricle, depression, lumbar degenerative disc disease, cardiac murmur, asthma, anxiety, obesity, HTN, and seasonal allergies She has been advised not to lift over 10 pounds by physician.      Examination-Activity Limitations  Bathing;Carry;Lift;Sit;Stand;Locomotion Level;Toileting;Dressing;Squat;Transfers;Stairs;Hygiene/Grooming;Other    Examination-Participation Restrictions  Community Activity;Volunteer;Cleaning;Interpersonal Relationship;Yard Work;Other    Rehab Potential  Fair    Clinical Impairments Affecting Rehab Potential  (+) motivated (-) muliple co morbidities, chronic condition    PT Frequency  2x / week    PT  Duration  12 weeks    PT Treatment/Interventions  Moist Heat;Patient/family education;Neuromuscular re-education;Therapeutic exercise;Manual techniques;ADLs/Self Care Home Management;Electrical Stimulation;Gait training;Stair training;Functional mobility training;Therapeutic activities;Balance training;Passive range of motion;Dry needling;Energy conservation;Spinal Manipulations;Joint Manipulations;Cryotherapy    PT Next Visit Plan  continue manual and strengthening activities as tolerated    PT Home Exercise Plan  Medbridge Access Code: [email or web address]    Consulted and Agree with Plan of Care  Patient       Patient will benefit from skilled therapeutic intervention in order to improve the following deficits and impairments:  Decreased strength, Impaired flexibility, Decreased activity tolerance, Impaired perceived functional ability, Pain, Decreased endurance, Difficulty walking, Abnormal gait, Decreased knowledge of use of DME, Decreased skin integrity, Decreased range of motion, Impaired sensation, Improper body mechanics, Obesity, Postural dysfunction, Increased edema, Decreased mobility, Decreased balance, Cardiopulmonary status limiting activity, Decreased coordination  Visit Diagnosis: Muscle weakness (generalized)  Chronic bilateral low back pain, unspecified whether sciatica present  Difficulty in walking, not elsewhere classified  History of falling     Problem List Patient Active Problem List   Diagnosis Date Noted  . Lower extremity edema 07/31/2018  . Pressure injury of skin 07/24/2018  . Surgical site infection 07/23/2018  . Wound of left leg 07/23/2018  . Atherosclerosis of artery of extremity with rest pain ([place])  07/08/2018  . Shortness of breath 06/02/2018  . Bruit 06/02/2018  . Coronary artery disease of native artery of native heart with stable angina pectoris ([place]) 06/02/2018  . Atherosclerosis of native arteries of extremity with intermittent claudication ([place])  05/19/2018  . Peripheral arterial disease ([place]) 05/06/2018  . Personal history of colonic polyps   . Aortic stenosis 12/24/2017  . LVH (left ventricular hypertrophy) 12/24/2017  . Hypertension with heart disease 12/24/2017  . Left atrial dilatation 12/12/2017  . Cardiac murmur 12/12/2017  . Gastrojejunal ulcer 12/04/2017  . Hyperphosphatemia 12/04/2017  . Spinal stenosis of lumbar region 06/18/2017  . Degeneration of lumbar intervertebral disc 06/02/2017  . Assistance needed with transportation 05/26/2017  . Financial difficulties 05/26/2017  . Needs assistance with community resources 05/26/2017  . Moderate recurrent major depression ([place]) 05/26/2017  . Non-healing wound of lower extremity 02/06/2017  . Status post ankle arthrodesis 12/11/2016  . Controlled substance agreement broken 10/11/2016  . Low back pain 09/25/2016  . Osteoarthritis of right subtalar joint 07/11/2016  . Posterior tibial tendinitis of right lower extremity 07/11/2016  . Breast cancer screening 06/18/2016  . Knee pain 04/03/2016  . Hyponatremia 03/01/2016  . High triglycerides 12/24/2015  . Pes planus of both feet 11/16/2015  . Plantar fasciitis of right foot 11/16/2015  . Heel spur 11/16/2015  . Diabetic neuropathy ([place]) 11/16/2015  . Right ankle pain 11/16/2015  . Medication monitoring encounter 11/16/2015  . Chronic pain of multiple joints 08/15/2015  . Abnormal mammogram of right breast 06/19/2015  . Cerumen impaction 03/16/2015  . Hyperlipidemia 12/14/2014  . History of transfusion of packed RBC 12/14/2014  . Hx of smoking 12/14/2014  . Status post bariatric surgery 12/14/2014  . Morbid obesity ([place]) 12/14/2014  . Chronic radicular lumbar pain 12/14/2014  . History of GI bleed 11/12/2014  . GERD without esophagitis 11/12/2014  . History of small bowel obstruction 09/07/2014    Everlean Alstrom. [name], PT, DPT 02/26/19, 10:41 AM  [facility] [address], [place], [address] Phone: [phone]   Fax:  [phone]  Name: Andrea Holden MRN: [id number] Date of Birth: 1945-11-10

## 2019-03-02 ENCOUNTER — Other Ambulatory Visit: Payer: Self-pay

## 2019-03-02 ENCOUNTER — Ambulatory Visit: Payer: Medicare Other | Attending: Nurse Practitioner | Admitting: Physical Therapy

## 2019-03-02 ENCOUNTER — Encounter: Payer: Self-pay | Admitting: Physical Therapy

## 2019-03-02 DIAGNOSIS — M6281 Muscle weakness (generalized): Secondary | ICD-10-CM | POA: Insufficient documentation

## 2019-03-02 DIAGNOSIS — Z9181 History of falling: Secondary | ICD-10-CM | POA: Diagnosis not present

## 2019-03-02 DIAGNOSIS — M545 Low back pain: Secondary | ICD-10-CM | POA: Diagnosis not present

## 2019-03-02 DIAGNOSIS — M62838 Other muscle spasm: Secondary | ICD-10-CM | POA: Insufficient documentation

## 2019-03-02 DIAGNOSIS — G8929 Other chronic pain: Secondary | ICD-10-CM | POA: Diagnosis not present

## 2019-03-02 DIAGNOSIS — R262 Difficulty in walking, not elsewhere classified: Secondary | ICD-10-CM | POA: Diagnosis not present

## 2019-03-02 NOTE — Therapy (Signed)
[facility] [address] [place], [address] Phone: [phone]   Fax:  [phone]  Physical Therapy Treatment  Patient Details  Name: Andrea Holden MRN: [id number] Date of Birth: 23-Jan-1946 Referring Provider (PT): Fredderick Severance, NP    Encounter Date: 03/02/2019  PT End of Session - 03/02/19 1108    Visit Number  24    Number of Visits  33    Date for PT Re-Evaluation  04/20/19    Authorization Type  [facility] Medicare reporting period from 01/26/2019)    Authorization Time Period  Current cert period 4/62/7035 - 03/23/2019 (latest PN: 01/26/2019)    Authorization - Visit Number  7    Authorization - Number of Visits  10    PT Start Time  1059    PT Stop Time  1200    PT Time Calculation (min)  61 min    Activity Tolerance  Patient limited by pain;Patient tolerated treatment well;Patient limited by fatigue    Behavior During Therapy  [facility] for tasks assessed/performed       Past Medical History:  Diagnosis Date  . Allergy    seasonal allergies  . Anemia 2014   needed 5 units of blood d/t passing out, weak  . Anxiety   . Aortic stenosis 12/24/2017   Echo Aug 2018  . Arthritis   . Asthma    allergy induced asthma  . Cardiac murmur 12/12/2017  . Cataract   . Cataract    left  . Complication of anesthesia    arrhythmia following colonoscopy  . Degenerative disc disease, lumbar   . Degenerative disc disease, lumbar   . Depression   . Diabetes mellitus   . Diabetic neuropathy ([place]) 11/16/2015  . Dysrhythmia    stenosis of left ventricle  . GERD (gastroesophageal reflux disease)   . H/O transfusion    patient was given 5 units of blood while at [place], blood type O+  . History of chicken pox   . History of hiatal hernia   . History of measles, mumps, or rubella   . HOH (hard of hearing)    does not use hearing aides yet  . Hyperlipidemia   . Hypertension   . Irregular heartbeat   . LVH  (left ventricular hypertrophy) 12/24/2017   Echo Aug 2018  . Neuropathy   . Opiate use 11/16/2015  . Peripheral arterial disease ([place]) 05/06/2018   At rest, left > right; refer to vasc  . Pes planus of both feet 11/16/2015  . Plantar fasciitis of right foot 11/16/2015  . Wheezing     Past Surgical History:  Procedure Laterality Date  . ABDOMINAL HYSTERECTOMY    . APPLICATION OF WOUND VAC Left 07/23/2018   Procedure: APPLICATION OF WOUND VAC;  Surgeon: Algernon Huxley, MD;  Location: [facility];  Service: Vascular;  Laterality: Left;  . banck injections    . CATARACT EXTRACTION W/PHACO Right 05/30/2015   Procedure: CATARACT EXTRACTION PHACO AND INTRAOCULAR LENS PLACEMENT (IOC);  Surgeon: Birder Robson, MD;  Location: [facility];  Service: Ophthalmology;  Laterality: Right;  [place] 00:57 AP% 20.9 CDE 11.99 fluid pack lot #[id number]  . CHOLECYSTECTOMY  1970  . COLON SURGERY  2013   blocked colon  . COLONOSCOPY WITH PROPOFOL N/A 01/20/2018   Procedure: COLONOSCOPY WITH PROPOFOL;  Surgeon: Lucilla Lame, MD;  Location: [facility];  Service: Endoscopy;  Laterality: N/A;  . ENDARTERECTOMY FEMORAL Left 07/08/2018  Procedure: ENDARTERECTOMY FEMORAL;  Surgeon: Algernon Huxley, MD;  Location: [facility] OR[facility];  Service: Vascular;  Laterality: Left;  . EYE SURGERY Right    cataract surgery  . FOOT FUSION Right 2018   metal in foot  . GALLBLADDER SURGERY  1970  . GASTRIC BYPASS  2010   lost 178 lbs and regained 40 lbs last few years  . HUMERUS FRACTURE SURGERY Right    metal plate with screws  . internal bleeding  2016   ulcer in past  . LOWER EXTREMITY ANGIOGRAM Left 07/08/2018   Procedure: LOWER EXTREMITY ANGIOGRAM;  Surgeon: Algernon Huxley, MD;  Location: [facility];  Service: Vascular;  Laterality: Left;  . LOWER EXTREMITY ANGIOGRAPHY Left 05/27/2018   Procedure: LOWER EXTREMITY ANGIOGRAPHY;  Surgeon: Algernon Huxley, MD;  Location: [place] [facility];  Service: Cardiovascular;  Laterality: Left;  . LOWER  EXTREMITY ANGIOGRAPHY Right 12/07/2018   Procedure: LOWER EXTREMITY ANGIOGRAPHY;  Surgeon: Algernon Huxley, MD;  Location: [place] [facility];  Service: Cardiovascular;  Laterality: Right;  . MOUTH SURGERY     root canals and crowns and extractions  . OVARY SURGERY    . SKIN GRAFT Right 2018   RT foot. foot has been rebuilt.  it is full of metal  . TENOTOMY ACHILLES TENDON Right    Percuntaneous. metal in foot  . TONSILLECTOMY    . WOUND DEBRIDEMENT Left 07/23/2018   Procedure: DEBRIDEMENT WOUND;  Surgeon: Algernon Huxley, MD;  Location: [facility];  Service: Vascular;  Laterality: Left;    There were no vitals filed for this visit.  Subjective Assessment - 03/02/19 1102    Subjective  Patient reports she is feeling pretty good with her hip but still having pain at both shoulder blades to neck 6.5/10. She states she has difficulties to keep her posture correct due to pain. She reports she felt good since the last treatment session.    Pertinent History  Patient is a 73 y.o. female who presents to outpatient physical therapy with a referral for medical diagnosis of degeneration of lumbar intervertebral disc. This patient's chief complaints consist of chronic low back pain and foot pain, weakness, stiffness, leading to the following functional deficits: difficulty with with basic ADLs, IADLs, ambulation. She used to get injections to help control back pain but it has recently worsened due to being unable to get injections during [place] pandemic. The patient has been informed of current processes in place at [facility]patient [facility] to protect patients from Covid-19 exposure including social distancing, schedule modifications, and new cleaning procedures. After discussing their particular risk with a therapist based on the patient's personal risk factors, the patient has decided to proceed with in-person therapy. Pt is scheduled for R LE angiography on 12/07/2018. She has recently started smoking again with the stress  of COVID19 pandemic and feels strongly she needs to stop. Surgeon has stated in the past he will not complete surgery if she is smoking.  Relevant past medical history and comorbidities include underwent extensive vascular surgeries in Jan 2020 to improve blood flow, similar surgery for R leg scheduled 12/07/2018. Has a history of left carotid artery stenosis, but not enough to have surgery, has a history of multiple arterial surgeries, history of R cataract surgery, colon surgery, cholecystectomy, R foot skin graft, ovary surgery, gastric bypass, R foot fusion, abdominal hysterectomy; current smoker, plantar fasciitis, peripheral arterial disease, LVH, diabetes mellitus with neuropathy, irregular heartbeat, hiatal hernia, GERD, stenosis of left heart ventricle,  depression, lumbar degenerative disc disease, cardiac murmur, asthma, anxiety, obesity, HTN, and seasonal allergies She has been advised not to lift over 10 pounds by physician    Limitations  Sitting;Standing;Walking;House hold activities;Lifting    How long can you sit comfortably?  > 1 hour in almost any chair    How long can you stand comfortably?  4-5 minutes    How long can you walk comfortably?  2 minutes, depends on what she has with her. Able to walk at fair with rollator. To mailbox 120 feet causes shaking in R leg.      Diagnostic tests  MRI lumbar spine report 06/14/2017: "IMPRESSION: 1. Stable degenerative of lumbar spondylosis and scoliosis with multilevel disc disease and facet disease. 2. Stable right foraminal stenosis at L5-S1 due to right-sided disc    Currently in Pain?  Yes    Pain Score  6     Pain Location  Shoulder    Pain Orientation  Left;Right    Pain Onset  More than a month ago       Therapeutic exercise:to centralize symptoms and improve ROM, strength, muscular endurance, and activity tolerance required for successful completion of functional activities.  NuStep level2-3 (first 3 min on lvl 2. Then each level  for 1 minute alternatively)using bilateral upper and lower extremities. Seatsetting 8/handle setting7. For improved extremity mobility, muscular endurance, and activity tolerance; and to induce the analgesic effect of aerobic exercise, stimulate improved joint nutrition, and prepare body structures and systems for following interventions. x21mnutes. Instructed to attempt to keep SPM above60during subjective exam. Average SPM = 68, 0.38 miles.   Seated isometric hip adductionball (yellow), 5 second hold1 minutes. Patient reports pain at skin of bilateral medial thigh, no back support, cuing for improved posture.  Seated isometric hip adductionball (volleyball) 5 second hold2 minutes. Cuing for technique and posture.   Seated isometric hip abduction against blue strap. No back support, cuing for improved posture x 3 min. 5 second holds.  Seated pallof press (multifidus press) + sitting on blue dyna disc with red theraband 2x10 each side. Cuing for trunk control and technique.   Seated ball strikes 5 minutes holding cane/weighted stainless stick in both hands horizontally and tapping small green children's ball back to clinician who tosses ball at patient while seated on dynadisc with trunk unsupported at edge of low plinth. To improve trunk control and coordination during moderately unpredictable task environment. Patient enjoyed task and did not report any increase in pain. Demo good activation of trunk muscles.   sit <>pronewith min A to get to and from prone position in a comfortable position with pillows placed under hips and feet formanual.Patient required prolonged timefor bed mobilitydue to pain, weakness, and fatigues.  Manual therapy:to reduce pain and tissue tension, improve range of motion, neuromodulation, in order to promote improved ability to complete functional activities.  prone with headresting in blue cradle.Pillow under lower legs: STM to muscles thoracic  spine and cervical spine including suboccipital muscles and bilateral upper traps and perispinal muscles to improve  tension and pain for improved posture and decreased pain.  Cupping with hands along lower cervical and thoracic spine to improve relaxation.  Palpation along lowest lumbar spine with mild discomfort.   Noted for most acute tenderness noted at sub-occipultal region. Reported manual somewhat tender but felt like a "good hurt" with improved relaxation following.    PT Education - 03/02/19 1107    Education Details  Exercise purpose/form. Self management techniques  Person(s) Educated  Patient    Methods  Explanation;Demonstration;Tactile cues;Verbal cues    Comprehension  Verbal cues required;Returned demonstration;Tactile cues required;Verbalized understanding       PT Short Term Goals - 01/26/19 1615      PT SHORT TERM GOAL #1   Title  Be independent with initial home exercise program for self-management of symptoms.    Baseline  Initial HEP provided at [facility] (11/17/2018); was completing until vascular procedure, re-instated 12/15/2018 (12/15/2018); currently participating (01/26/2019)    Time  2    Period  Weeks    Status  Achieved    Target Date  01/19/19        PT Long Term Goals - 01/26/19 1616      PT LONG TERM GOAL #1   Title  Be independent with a long-term home exercise program for self-management of symptoms.     Baseline  Initial HEP provided at [facility] (11/17/2018); was doing better before vascular surgery, is working at returning to [facility] since procedure (12/15/2018); currently participating but not assigned final long term HEP yet (01/26/2019);    Time  12    Period  Weeks    Status  Partially Met    Target Date  04/20/19      PT LONG TERM GOAL #2   Title  Patient will improve 6MWT to equal or greater than 1000 feet with LRAD to demonstrate improved activity tolerance for community ambulation.     Baseline  6 Minute Walk Test: 200 feet with SPC and supervision.  Stopped at 1:43 due to fatigue and leg pain. (11/17/2018); 457 feet SPC, on seated and one standing break (12/15/2018); 547 with SPC, 2 seated and one standing rest break (01/26/2019);    Time  12    Period  Weeks    Status  Partially Met    Target Date  04/20/19      PT LONG TERM GOAL #3   Title  Patient will demonstrate improved 5 Times Sit to Stand test to 11 seconds from chair height with no UE support to demonstrate improvement in LE power and functional strength for daily activities.    Baseline  5 Time Sit to Stand: 27 seconds from chair height no UE support (11/17/2018); 18 seconds, no UE support from chair-high plinth (12/15/2018); 16 seconds with no UE support from chair (01/26/2019);    Time  12    Period  Weeks    Status  Partially Met    Target Date  04/20/19      PT LONG TERM GOAL #4   Title  Patient will ambulate faster than 1.58ms on the 10 Meter Walk Test to improve ability to cross street safey for community participation.     Baseline  testing deferred to next session (11/17/2018); 1.37 m/sec with SPC (11/24/2018); 1.3 m/sec with SPC (12/15/2018; 01/26/2019);    Time  12    Period  Weeks    Status  Achieved    Target Date  04/20/19      PT LONG TERM GOAL #5   Title  Patient will score equal or greater than  25/30 on the Functional Gait Assessment to demonstrate low fall risk to improve her ability to participate safely in community activities.     Baseline  testing deferred to next session (11/17/2018); 11/30 high fall risk (11/24/2018); unable to measure due to time limitation (12/15/2018); 13/30 high fall risk (12/17/2018); 14/30 high fall risk (01/26/2019);    Time  12  Period  Weeks    Status  Partially Met    Target Date  04/20/19      Additional Long Term Goals   Additional Long Term Goals  Yes      PT LONG TERM GOAL #6   Title  Reduce pain to equal or less than 4/10 with functional activities to allow patient to complete valued functional tasks such as caring for dogs,  grocery shopping, walking with less difficulty.    Baseline  8/10 (01/26/2019);    Time  12    Period  Weeks    Status  New    Target Date  04/20/19            Plan - 03/02/19 1108    Clinical Impression Statement  Patient tolerated treatment well today and was able to continue gentle strengthening of the trunk and hips and progressed to interval training on the NuStep with improved SPM compared to last session. Continues to demonstrate overall decreased activity tolerance and core strength. Requires frequent re-direction to stay on task. Patient felt significant relief with manual therapy. Patient continues to have significant pain, stiffness, activity tolerance, weakness/power/endurance deficits that limiter her ability to complete ADLs, IADLs, and usual community activities. Patient would benefit from continued physical therapy to address remaining impairments and functional limitations to work towards stated goals and return to PLOF or maximal functional independence.    Personal Factors and Comorbidities  Age;Comorbidity 3+;Fitness;Time since onset of injury/illness/exacerbation;Past/Current Experience;Other    Comorbidities  underwent extensive vascular surgeries in Jan 2020 to improve blood flow, similar surgery for R leg scheduled 12/07/2018. Has a history of left carotid artery stenosis, but not enough to have surgery, has a history of multiple arterial surgeries, history of R cataract surgery, colon surgery, cholecystectomy, R foot skin graft, ovary surgery, gastric bypass, R foot fusion, abdominal hysterectomy; current smoker, plantar fasciitis, peripheral arterial disease, LVH, diabetes mellitus with neuropathy, irregular heartbeat, hiatal hernia, GERD, stenosis of left heart ventricle, depression, lumbar degenerative disc disease, cardiac murmur, asthma, anxiety, obesity, HTN, and seasonal allergies She has been advised not to lift over 10 pounds by physician.      Examination-Activity  Limitations  Bathing;Carry;Lift;Sit;Stand;Locomotion Level;Toileting;Dressing;Squat;Transfers;Stairs;Hygiene/Grooming;Other    Examination-Participation Restrictions  Community Activity;Volunteer;Cleaning;Interpersonal Relationship;Yard Work;Other    Rehab Potential  Fair    Clinical Impairments Affecting Rehab Potential  (+) motivated (-) muliple co morbidities, chronic condition    PT Frequency  2x / week    PT Duration  12 weeks    PT Treatment/Interventions  Moist Heat;Patient/family education;Neuromuscular re-education;Therapeutic exercise;Manual techniques;ADLs/Self Care Home Management;Electrical Stimulation;Gait training;Stair training;Functional mobility training;Therapeutic activities;Balance training;Passive range of motion;Dry needling;Energy conservation;Spinal Manipulations;Joint Manipulations;Cryotherapy    PT Next Visit Plan  continue manual and strengthening activities as tolerated    PT Home Exercise Plan  Medbridge Access Code: [email or web address]    Consulted and Agree with Plan of Care  Patient       Patient will benefit from skilled therapeutic intervention in order to improve the following deficits and impairments:  Decreased strength, Impaired flexibility, Decreased activity tolerance, Impaired perceived functional ability, Pain, Decreased endurance, Difficulty walking, Abnormal gait, Decreased knowledge of use of DME, Decreased skin integrity, Decreased range of motion, Impaired sensation, Improper body mechanics, Obesity, Postural dysfunction, Increased edema, Decreased mobility, Decreased balance, Cardiopulmonary status limiting activity, Decreased coordination  Visit Diagnosis: Muscle weakness (generalized)  Chronic bilateral low back pain, unspecified whether sciatica present  Difficulty in walking, not elsewhere classified  History of falling  Other  muscle spasm     Problem List Patient Active Problem List   Diagnosis Date Noted  . Lower extremity edema 07/31/2018   . Pressure injury of skin 07/24/2018  . Surgical site infection 07/23/2018  . Wound of left leg 07/23/2018  . Atherosclerosis of artery of extremity with rest pain ([place]) 07/08/2018  . Shortness of breath 06/02/2018  . Bruit 06/02/2018  . Coronary artery disease of native artery of native heart with stable angina pectoris ([place]) 06/02/2018  . Atherosclerosis of native arteries of extremity with intermittent claudication ([place]) 05/19/2018  . Peripheral arterial disease ([place]) 05/06/2018  . Personal history of colonic polyps   . Aortic stenosis 12/24/2017  . LVH (left ventricular hypertrophy) 12/24/2017  . Hypertension with heart disease 12/24/2017  . Left atrial dilatation 12/12/2017  . Cardiac murmur 12/12/2017  . Gastrojejunal ulcer 12/04/2017  . Hyperphosphatemia 12/04/2017  . Spinal stenosis of lumbar region 06/18/2017  . Degeneration of lumbar intervertebral disc 06/02/2017  . Assistance needed with transportation 05/26/2017  . Financial difficulties 05/26/2017  . Needs assistance with community resources 05/26/2017  . Moderate recurrent major depression ([place]) 05/26/2017  . Non-healing wound of lower extremity 02/06/2017  . Status post ankle arthrodesis 12/11/2016  . Controlled substance agreement broken 10/11/2016  . Low back pain 09/25/2016  . Osteoarthritis of right subtalar joint 07/11/2016  . Posterior tibial tendinitis of right lower extremity 07/11/2016  . Breast cancer screening 06/18/2016  . Knee pain 04/03/2016  . Hyponatremia 03/01/2016  . High triglycerides 12/24/2015  . Pes planus of both feet 11/16/2015  . Plantar fasciitis of right foot 11/16/2015  . Heel spur 11/16/2015  . Diabetic neuropathy ([place]) 11/16/2015  . Right ankle pain 11/16/2015  . Medication monitoring encounter 11/16/2015  . Chronic pain of multiple joints 08/15/2015  . Abnormal mammogram of right breast 06/19/2015  . Cerumen impaction 03/16/2015  . Hyperlipidemia 12/14/2014  . History of  transfusion of packed RBC 12/14/2014  . Hx of smoking 12/14/2014  . Status post bariatric surgery 12/14/2014  . Morbid obesity ([place]) 12/14/2014  . Chronic radicular lumbar pain 12/14/2014  . History of GI bleed 11/12/2014  . GERD without esophagitis 11/12/2014  . History of small bowel obstruction 09/07/2014    Student physical therapist under direct supervision of licensed physical therapists during the entirety of the session.   Sherrilyn Rist, SPT  Everlean Alstrom. [name], PT, DPT 03/02/19, 12:59 PM  [facility] [address] [place], [address] Phone: [phone]   Fax:  [phone]  Name: Andrea Holden MRN: [id number] Date of Birth: 22-Mar-1946

## 2019-03-03 ENCOUNTER — Other Ambulatory Visit: Payer: Self-pay | Admitting: Nurse Practitioner

## 2019-03-03 ENCOUNTER — Ambulatory Visit
Admission: RE | Admit: 2019-03-03 | Discharge: 2019-03-03 | Disposition: A | Discharge status: Home / Self Care | Payer: Medicare Other | Source: Ambulatory Visit | Attending: Family Medicine | Admitting: Family Medicine

## 2019-03-03 ENCOUNTER — Encounter: Payer: Self-pay | Admitting: Nurse Practitioner

## 2019-03-03 DIAGNOSIS — Z1239 Encounter for other screening for malignant neoplasm of breast: Secondary | ICD-10-CM

## 2019-03-03 DIAGNOSIS — E2839 Other primary ovarian failure: Secondary | ICD-10-CM

## 2019-03-03 DIAGNOSIS — Z1231 Encounter for screening mammogram for malignant neoplasm of breast: Secondary | ICD-10-CM | POA: Diagnosis not present

## 2019-03-03 DIAGNOSIS — Z780 Asymptomatic menopausal state: Secondary | ICD-10-CM | POA: Diagnosis not present

## 2019-03-03 DIAGNOSIS — F3341 Major depressive disorder, recurrent, in partial remission: Secondary | ICD-10-CM

## 2019-03-03 DIAGNOSIS — M8580 Other specified disorders of bone density and structure, unspecified site: Secondary | ICD-10-CM | POA: Insufficient documentation

## 2019-03-03 DIAGNOSIS — M85851 Other specified disorders of bone density and structure, right thigh: Secondary | ICD-10-CM | POA: Diagnosis not present

## 2019-03-04 ENCOUNTER — Encounter: Payer: Self-pay | Admitting: Physical Therapy

## 2019-03-04 ENCOUNTER — Ambulatory Visit: Payer: Medicare Other | Admitting: Physical Therapy

## 2019-03-04 ENCOUNTER — Telehealth: Payer: Self-pay

## 2019-03-04 ENCOUNTER — Other Ambulatory Visit: Payer: Self-pay

## 2019-03-04 DIAGNOSIS — M6281 Muscle weakness (generalized): Secondary | ICD-10-CM

## 2019-03-04 DIAGNOSIS — M62838 Other muscle spasm: Secondary | ICD-10-CM | POA: Diagnosis not present

## 2019-03-04 DIAGNOSIS — M545 Low back pain: Secondary | ICD-10-CM | POA: Diagnosis not present

## 2019-03-04 DIAGNOSIS — Z9181 History of falling: Secondary | ICD-10-CM | POA: Diagnosis not present

## 2019-03-04 DIAGNOSIS — R262 Difficulty in walking, not elsewhere classified: Secondary | ICD-10-CM | POA: Diagnosis not present

## 2019-03-04 DIAGNOSIS — G8929 Other chronic pain: Secondary | ICD-10-CM

## 2019-03-04 NOTE — Telephone Encounter (Signed)
Copied from [place] [id number]. Topic: General - Inquiry >> Mar 04, 2019  2:08 PM Stovall, Shana A wrote: Reason for CRM: pt called in and would like the results of her mamorgram on dexa scan.  Pt was not able to get into mychart , I tired with her on the phone for 10 mins she was not able to find it  Best number -[phone]

## 2019-03-04 NOTE — Therapy (Signed)
[facility] [address], [place], [address] Phone: [phone]   Fax:  [phone]  Physical Therapy Treatment  Patient Details  Name: Andrea Holden MRN: [id number] Date of Birth: 1945-07-10 Referring Provider (PT): Fredderick Severance, NP    Encounter Date: 03/04/2019  PT End of Session - 03/04/19 1502    Visit Number  25    Number of Visits  33    Date for PT Re-Evaluation  04/20/19    Authorization Type  [facility] Medicare reporting period from 01/26/2019)    Authorization Time Period  Current cert period 08/11/1550 - 03/23/2019 (latest PN: 01/26/2019)    Authorization - Visit Number  8    Authorization - Number of Visits  10    PT Start Time  1300    PT Stop Time  1345    PT Time Calculation (min)  45 min    Activity Tolerance  Patient limited by pain;Patient tolerated treatment well;Patient limited by fatigue    Behavior During Therapy  [facility] for tasks assessed/performed       Past Medical History:  Diagnosis Date  . Allergy    seasonal allergies  . Anemia 2014   needed 5 units of blood d/t passing out, weak  . Anxiety   . Aortic stenosis 12/24/2017   Echo Aug 2018  . Arthritis   . Asthma    allergy induced asthma  . Cardiac murmur 12/12/2017  . Cataract   . Cataract    left  . Complication of anesthesia    arrhythmia following colonoscopy  . Degenerative disc disease, lumbar   . Degenerative disc disease, lumbar   . Depression   . Diabetes mellitus   . Diabetic neuropathy ([place]) 11/16/2015  . Dysrhythmia    stenosis of left ventricle  . GERD (gastroesophageal reflux disease)   . H/O transfusion    patient was given 5 units of blood while at [place], blood type O+  . History of chicken pox   . History of hiatal hernia   . History of measles, mumps, or rubella   . HOH (hard of hearing)    does not use hearing aides yet  . Hyperlipidemia   . Hypertension   . Irregular heartbeat   . LVH  (left ventricular hypertrophy) 12/24/2017   Echo Aug 2018  . Neuropathy   . Opiate use 11/16/2015  . Peripheral arterial disease ([place]) 05/06/2018   At rest, left > right; refer to vasc  . Pes planus of both feet 11/16/2015  . Plantar fasciitis of right foot 11/16/2015  . Wheezing     Past Surgical History:  Procedure Laterality Date  . ABDOMINAL HYSTERECTOMY    . APPLICATION OF WOUND VAC Left 07/23/2018   Procedure: APPLICATION OF WOUND VAC;  Surgeon: Algernon Huxley, MD;  Location: [facility];  Service: Vascular;  Laterality: Left;  . banck injections    . CATARACT EXTRACTION W/PHACO Right 05/30/2015   Procedure: CATARACT EXTRACTION PHACO AND INTRAOCULAR LENS PLACEMENT (IOC);  Surgeon: Birder Robson, MD;  Location: [facility];  Service: Ophthalmology;  Laterality: Right;  [place] 00:57 AP% 20.9 CDE 11.99 fluid pack lot #[id number]  . CHOLECYSTECTOMY  1970  . COLON SURGERY  2013   blocked colon  . COLONOSCOPY WITH PROPOFOL N/A 01/20/2018   Procedure: COLONOSCOPY WITH PROPOFOL;  Surgeon: Lucilla Lame, MD;  Location: [facility];  Service: Endoscopy;  Laterality: N/A;  . ENDARTERECTOMY FEMORAL Left 07/08/2018  Procedure: ENDARTERECTOMY FEMORAL;  Surgeon: Algernon Huxley, MD;  Location: [facility] OR[facility];  Service: Vascular;  Laterality: Left;  . EYE SURGERY Right    cataract surgery  . FOOT FUSION Right 2018   metal in foot  . GALLBLADDER SURGERY  1970  . GASTRIC BYPASS  2010   lost 178 lbs and regained 40 lbs last few years  . HUMERUS FRACTURE SURGERY Right    metal plate with screws  . internal bleeding  2016   ulcer in past  . LOWER EXTREMITY ANGIOGRAM Left 07/08/2018   Procedure: LOWER EXTREMITY ANGIOGRAM;  Surgeon: Algernon Huxley, MD;  Location: [facility];  Service: Vascular;  Laterality: Left;  . LOWER EXTREMITY ANGIOGRAPHY Left 05/27/2018   Procedure: LOWER EXTREMITY ANGIOGRAPHY;  Surgeon: Algernon Huxley, MD;  Location: [facility];  Service: Cardiovascular;  Laterality: Left;  . LOWER  EXTREMITY ANGIOGRAPHY Right 12/07/2018   Procedure: LOWER EXTREMITY ANGIOGRAPHY;  Surgeon: Algernon Huxley, MD;  Location: [facility];  Service: Cardiovascular;  Laterality: Right;  . MOUTH SURGERY     root canals and crowns and extractions  . OVARY SURGERY    . SKIN GRAFT Right 2018   RT foot. foot has been rebuilt.  it is full of metal  . TENOTOMY ACHILLES TENDON Right    Percuntaneous. metal in foot  . TONSILLECTOMY    . WOUND DEBRIDEMENT Left 07/23/2018   Procedure: DEBRIDEMENT WOUND;  Surgeon: Algernon Huxley, MD;  Location: [facility];  Service: Vascular;  Laterality: Left;    There were no vitals filed for this visit.  Subjective Assessment - 03/04/19 1309    Subjective  Patient reports she is feeling pretty good with her hip, decrease pain at both shoulder blades to neck. She states the lower back pain at 5/10. She states she has difficulties to keep her posture correct due to pain. She reports she felt good since the last treatment session.    Pertinent History  Patient is a 73 y.o. female who presents to outpatient physical therapy with a referral for medical diagnosis of degeneration of lumbar intervertebral disc. This patient's chief complaints consist of chronic low back pain and foot pain, weakness, stiffness, leading to the following functional deficits: difficulty with with basic ADLs, IADLs, ambulation. She used to get injections to help control back pain but it has recently worsened due to being unable to get injections during [place] pandemic. The patient has been informed of current processes in place at [facility] to protect patients from Covid-19 exposure including social distancing, schedule modifications, and new cleaning procedures. After discussing their particular risk with a therapist based on the patient's personal risk factors, the patient has decided to proceed with in-person therapy. Pt is scheduled for R LE angiography on 12/07/2018. She has recently started  smoking again with the stress of COVID19 pandemic and feels strongly she needs to stop. Surgeon has stated in the past he will not complete surgery if she is smoking.  Relevant past medical history and comorbidities include underwent extensive vascular surgeries in Jan 2020 to improve blood flow, similar surgery for R leg scheduled 12/07/2018. Has a history of left carotid artery stenosis, but not enough to have surgery, has a history of multiple arterial surgeries, history of R cataract surgery, colon surgery, cholecystectomy, R foot skin graft, ovary surgery, gastric bypass, R foot fusion, abdominal hysterectomy; current smoker, plantar fasciitis, peripheral arterial disease, LVH, diabetes mellitus with neuropathy, irregular heartbeat, hiatal hernia, GERD,  stenosis of left heart ventricle, depression, lumbar degenerative disc disease, cardiac murmur, asthma, anxiety, obesity, HTN, and seasonal allergies She has been advised not to lift over 10 pounds by physician    Limitations  Sitting;Standing;Walking;House hold activities;Lifting    How long can you sit comfortably?  > 1 hour in almost any chair    How long can you stand comfortably?  4-5 minutes    How long can you walk comfortably?  2 minutes, depends on what she has with her. Able to walk at fair with rollator. To mailbox 120 feet causes shaking in R leg.      Diagnostic tests  MRI lumbar spine report 06/14/2017: "IMPRESSION: 1. Stable degenerative of lumbar spondylosis and scoliosis with multilevel disc disease and facet disease. 2. Stable right foraminal stenosis at L5-S1 due to right-sided disc    Currently in Pain?  Yes    Pain Score  5     Pain Location  Back    Pain Onset  More than a month ago       Therapeutic exercise:to centralize symptoms and improve ROM, strength, muscular endurance, and activity tolerance required for successful completion of functional activities.  Seated isometric hip adductionball (green children's ball), 5  second holdx 10 reps. Patient reports no pain at skin of bilateral medial thigh, no back support, cuing for improved posture.  Seated hip abduction against green band. No back support, cuing for improved posture2x15 on each side.  Seated pelvic anterior/posterior tilt 2x10 each direction. Cuing for hand positioning.    Seated transverse abdominals activation 5s hold x 10 reps. Pt states difficulties while breathing and keep transverse abdominals activating at same time. Tactile cuing to keep posture and muscle activation.   Seated ball strikes 5 minutes holding weighted stainless stick in both hands horizontally and tapping small green children's ball back to clinician who tosses ball at patient while seated on dynadisc with trunk unsupported at edge of low plinth. To improve trunk control and coordination during moderately unpredictable task environment. Patient enjoyed task and did not report any increase in pain. Demo good activation of trunk muscles.  sit <>pronewith min A to get to and from prone position in a comfortable position with pillows placed under hips and feet formanual.Patient required prolonged timefor bed mobilitydue to pain, weakness, and fatigues.  Manual therapy:to reduce pain and tissue tension, improve range of motion, neuromodulation, in order to promote improved ability to complete functional activities.  prone with headresting in blue cradle.Pillow under lower legs: STM to muscles thoracic spine and cervical spine including suboccipital muscles and bilateral upper traps and perispinal muscles to improve  tension and pain for improved posture and decreased pain.  Cupping with hands along lower cervical and thoracic spine to improve relaxation.  Palpation along lowest lumbar spine with mild to moderate discomfort.   Noted for most acute tenderness noted at sub-occipultal region.Reported manual somewhat tender but felt like a "good hurt" with improved  relaxation following.      PT Education - 03/04/19 1502    Education provided  Yes    Education Details  Exercise purpose/form. Self management techniques    Person(s) Educated  Patient    Methods  Explanation;Demonstration;Tactile cues;Verbal cues    Comprehension  Verbalized understanding;Returned demonstration;Verbal cues required;Tactile cues required       PT Short Term Goals - 01/26/19 1615      PT SHORT TERM GOAL #1   Title  Be independent with initial home exercise program  for self-management of symptoms.    Baseline  Initial HEP provided at [facility] (11/17/2018); was completing until vascular procedure, re-instated 12/15/2018 (12/15/2018); currently participating (01/26/2019)    Time  2    Period  Weeks    Status  Achieved    Target Date  01/19/19        PT Long Term Goals - 01/26/19 1616      PT LONG TERM GOAL #1   Title  Be independent with a long-term home exercise program for self-management of symptoms.     Baseline  Initial HEP provided at [facility] (11/17/2018); was doing better before vascular surgery, is working at returning to [facility] since procedure (12/15/2018); currently participating but not assigned final long term HEP yet (01/26/2019);    Time  12    Period  Weeks    Status  Partially Met    Target Date  04/20/19      PT LONG TERM GOAL #2   Title  Patient will improve 6MWT to equal or greater than 1000 feet with LRAD to demonstrate improved activity tolerance for community ambulation.     Baseline  6 Minute Walk Test: 200 feet with SPC and supervision. Stopped at 1:43 due to fatigue and leg pain. (11/17/2018); 457 feet SPC, on seated and one standing break (12/15/2018); 547 with SPC, 2 seated and one standing rest break (01/26/2019);    Time  12    Period  Weeks    Status  Partially Met    Target Date  04/20/19      PT LONG TERM GOAL #3   Title  Patient will demonstrate improved 5 Times Sit to Stand test to 11 seconds from chair height with no UE support to demonstrate  improvement in LE power and functional strength for daily activities.    Baseline  5 Time Sit to Stand: 27 seconds from chair height no UE support (11/17/2018); 18 seconds, no UE support from chair-high plinth (12/15/2018); 16 seconds with no UE support from chair (01/26/2019);    Time  12    Period  Weeks    Status  Partially Met    Target Date  04/20/19      PT LONG TERM GOAL #4   Title  Patient will ambulate faster than 1.70ms on the 10 Meter Walk Test to improve ability to cross street safey for community participation.     Baseline  testing deferred to next session (11/17/2018); 1.37 m/sec with SPC (11/24/2018); 1.3 m/sec with SPC (12/15/2018; 01/26/2019);    Time  12    Period  Weeks    Status  Achieved    Target Date  04/20/19      PT LONG TERM GOAL #5   Title  Patient will score equal or greater than  25/30 on the Functional Gait Assessment to demonstrate low fall risk to improve her ability to participate safely in community activities.     Baseline  testing deferred to next session (11/17/2018); 11/30 high fall risk (11/24/2018); unable to measure due to time limitation (12/15/2018); 13/30 high fall risk (12/17/2018); 14/30 high fall risk (01/26/2019);    Time  12    Period  Weeks    Status  Partially Met    Target Date  04/20/19      Additional Long Term Goals   Additional Long Term Goals  Yes      PT LONG TERM GOAL #6   Title  Reduce pain to equal or less than 4/10  with functional activities to allow patient to complete valued functional tasks such as caring for dogs, grocery shopping, walking with less difficulty.    Baseline  8/10 (01/26/2019);    Time  12    Period  Weeks    Status  New    Target Date  04/20/19            Plan - 03/04/19 1503    Clinical Impression Statement  Patient tolerated treatment well today and showing improvements with BUE motor control and stability. Continues to demonstrate overall decreased activity tolerance and core strength. Able to follow  instructions with core activation but struggled with breathing at the same time. Patient continued feeling significant relief with manual therapy. Patient continues to have significant pain, stiffness, activity tolerance, weakness/power/endurance deficits that limiter her ability to complete ADLs, IADLs, and usual community activities. Patient would benefit from continued physical therapy to address remaining impairments and functional limitations to work towards stated goals and return to PLOF or maximal functional independence.    Personal Factors and Comorbidities  Age;Comorbidity 3+;Fitness;Time since onset of injury/illness/exacerbation;Past/Current Experience;Other    Comorbidities  underwent extensive vascular surgeries in Jan 2020 to improve blood flow, similar surgery for R leg scheduled 12/07/2018. Has a history of left carotid artery stenosis, but not enough to have surgery, has a history of multiple arterial surgeries, history of R cataract surgery, colon surgery, cholecystectomy, R foot skin graft, ovary surgery, gastric bypass, R foot fusion, abdominal hysterectomy; current smoker, plantar fasciitis, peripheral arterial disease, LVH, diabetes mellitus with neuropathy, irregular heartbeat, hiatal hernia, GERD, stenosis of left heart ventricle, depression, lumbar degenerative disc disease, cardiac murmur, asthma, anxiety, obesity, HTN, and seasonal allergies She has been advised not to lift over 10 pounds by physician.      Examination-Activity Limitations  Bathing;Carry;Lift;Sit;Stand;Locomotion Level;Toileting;Dressing;Squat;Transfers;Stairs;Hygiene/Grooming;Other    Examination-Participation Restrictions  Community Activity;Volunteer;Cleaning;Interpersonal Relationship;Yard Work;Other    Rehab Potential  Fair    Clinical Impairments Affecting Rehab Potential  (+) motivated (-) muliple co morbidities, chronic condition    PT Frequency  2x / week    PT Duration  12 weeks    PT  Treatment/Interventions  Moist Heat;Patient/family education;Neuromuscular re-education;Therapeutic exercise;Manual techniques;ADLs/Self Care Home Management;Electrical Stimulation;Gait training;Stair training;Functional mobility training;Therapeutic activities;Balance training;Passive range of motion;Dry needling;Energy conservation;Spinal Manipulations;Joint Manipulations;Cryotherapy    PT Next Visit Plan  continue manual and strengthening activities as tolerated    PT Home Exercise Plan  Medbridge Access Code: [email or web address]    Consulted and Agree with Plan of Care  Patient       Patient will benefit from skilled therapeutic intervention in order to improve the following deficits and impairments:  Decreased strength, Impaired flexibility, Decreased activity tolerance, Impaired perceived functional ability, Pain, Decreased endurance, Difficulty walking, Abnormal gait, Decreased knowledge of use of DME, Decreased skin integrity, Decreased range of motion, Impaired sensation, Improper body mechanics, Obesity, Postural dysfunction, Increased edema, Decreased mobility, Decreased balance, Cardiopulmonary status limiting activity, Decreased coordination  Visit Diagnosis: Muscle weakness (generalized)  Chronic bilateral low back pain, unspecified whether sciatica present  Difficulty in walking, not elsewhere classified  History of falling     Problem List Patient Active Problem List   Diagnosis Date Noted  . Osteopenia 03/03/2019  . Lower extremity edema 07/31/2018  . Pressure injury of skin 07/24/2018  . Surgical site infection 07/23/2018  . Wound of left leg 07/23/2018  . Atherosclerosis of artery of extremity with rest pain ([place]) 07/08/2018  . Shortness of breath 06/02/2018  . Bruit 06/02/2018  .  Coronary artery disease of native artery of native heart with stable angina pectoris ([place]) 06/02/2018  . Atherosclerosis of native arteries of extremity with intermittent claudication ([place])  05/19/2018  . Peripheral arterial disease ([place]) 05/06/2018  . Personal history of colonic polyps   . Aortic stenosis 12/24/2017  . LVH (left ventricular hypertrophy) 12/24/2017  . Hypertension with heart disease 12/24/2017  . Left atrial dilatation 12/12/2017  . Cardiac murmur 12/12/2017  . Gastrojejunal ulcer 12/04/2017  . Hyperphosphatemia 12/04/2017  . Spinal stenosis of lumbar region 06/18/2017  . Degeneration of lumbar intervertebral disc 06/02/2017  . Assistance needed with transportation 05/26/2017  . Financial difficulties 05/26/2017  . Needs assistance with community resources 05/26/2017  . Moderate recurrent major depression ([place]) 05/26/2017  . Non-healing wound of lower extremity 02/06/2017  . Status post ankle arthrodesis 12/11/2016  . Controlled substance agreement broken 10/11/2016  . Low back pain 09/25/2016  . Osteoarthritis of right subtalar joint 07/11/2016  . Posterior tibial tendinitis of right lower extremity 07/11/2016  . Breast cancer screening 06/18/2016  . Knee pain 04/03/2016  . Hyponatremia 03/01/2016  . High triglycerides 12/24/2015  . Pes planus of both feet 11/16/2015  . Plantar fasciitis of right foot 11/16/2015  . Heel spur 11/16/2015  . Diabetic neuropathy ([place]) 11/16/2015  . Right ankle pain 11/16/2015  . Medication monitoring encounter 11/16/2015  . Chronic pain of multiple joints 08/15/2015  . Abnormal mammogram of right breast 06/19/2015  . Cerumen impaction 03/16/2015  . Hyperlipidemia 12/14/2014  . History of transfusion of packed RBC 12/14/2014  . Hx of smoking 12/14/2014  . Status post bariatric surgery 12/14/2014  . Morbid obesity ([place]) 12/14/2014  . Chronic radicular lumbar pain 12/14/2014  . History of GI bleed 11/12/2014  . GERD without esophagitis 11/12/2014  . History of small bowel obstruction 09/07/2014    Sherrilyn Rist, SPT 03/04/19, 4:30 PM   [facility] [address] [place], [address] Phone: [phone]   Fax:  [phone]  Name: Andrea Holden MRN: [id number] Date of Birth: 23-Oct-1945

## 2019-03-04 NOTE — Telephone Encounter (Signed)
Please review

## 2019-03-05 MED ORDER — VITAMIN D (CHOLECALCIFEROL) 25 MCG (1000 UT) PO TABS: 1.0000 | ORAL_TABLET | Freq: Every day | ORAL | 3 refills | Status: DC

## 2019-03-05 NOTE — Addendum Note (Signed)
Addended by: Fredderick Severance on: 03/05/2019 04:45 PM   Modules accepted: Orders

## 2019-03-05 NOTE — Telephone Encounter (Signed)
Mammogram is clear, repeat screening in one year if no concerning symptoms.  dexa scan shows osteopenia, to prevent osteoporosis:  - avoid smoking  -consume 1200 mg of calcium per day and 800 IU of Vitamin D per day (total of diet plus supplements). The main dietary sources of calcium include milk and other dairy products, such as cottage cheese, yogurt, and hard cheese, and green vegetables, such as kale and broccoli. Sunlight exposure is recommended if tolerated for 30 minutes 5 times a day

## 2019-03-05 NOTE — Telephone Encounter (Signed)
Pt notified, states if vitamin d is sent to pharmacy they will pay?  Also has a sty in eye and wants to see if you will send antibiotic?  I told her most likely will need appt

## 2019-03-05 NOTE — Telephone Encounter (Signed)
Needs appointment for antibiotics however most styes self-resolve in less than a week. Recommend using warm moist compress to eye at least 4-5 times a day to facilitate drainage. followup with pcp or eye doctor if not improving, eye doctor if having visual changes

## 2019-03-06 ENCOUNTER — Other Ambulatory Visit: Payer: Self-pay | Admitting: Family Medicine

## 2019-03-09 NOTE — Telephone Encounter (Signed)
Pt given info that she would need an appt and has one on 9/10

## 2019-03-10 ENCOUNTER — Ambulatory Visit: Payer: Medicare Other

## 2019-03-10 ENCOUNTER — Other Ambulatory Visit: Payer: Self-pay

## 2019-03-10 ENCOUNTER — Encounter: Payer: Self-pay | Admitting: Physical Therapy

## 2019-03-10 DIAGNOSIS — M545 Low back pain: Secondary | ICD-10-CM | POA: Diagnosis not present

## 2019-03-10 DIAGNOSIS — M6281 Muscle weakness (generalized): Secondary | ICD-10-CM

## 2019-03-10 DIAGNOSIS — R262 Difficulty in walking, not elsewhere classified: Secondary | ICD-10-CM

## 2019-03-10 DIAGNOSIS — G8929 Other chronic pain: Secondary | ICD-10-CM | POA: Diagnosis not present

## 2019-03-10 DIAGNOSIS — Z9181 History of falling: Secondary | ICD-10-CM | POA: Diagnosis not present

## 2019-03-10 DIAGNOSIS — M62838 Other muscle spasm: Secondary | ICD-10-CM

## 2019-03-10 NOTE — Therapy (Signed)
[facility] [address] [place], [address] Phone: [phone]   Fax:  [phone]  Physical Therapy Treatment  Patient Details  Name: Andrea Holden MRN: [id number] Date of Birth: 1946/04/22 Referring Provider (PT): Fredderick Severance, NP    Encounter Date: 03/10/2019  PT End of Session - 03/10/19 1623    Visit Number  26    Number of Visits  33    Date for PT Re-Evaluation  04/20/19    Authorization Type  [facility] Medicare reporting period from 01/26/2019)    Authorization Time Period  Current cert period 5/69/7948 - 03/23/2019 (latest PN: 01/26/2019)    Authorization - Visit Number  9    Authorization - Number of Visits  10    PT Start Time  1303    PT Stop Time  1346    PT Time Calculation (min)  43 min    Activity Tolerance  Patient limited by pain;Patient tolerated treatment well;Patient limited by fatigue    Behavior During Therapy  [facility] for tasks assessed/performed       Past Medical History:  Diagnosis Date  . Allergy    seasonal allergies  . Anemia 2014   needed 5 units of blood d/t passing out, weak  . Anxiety   . Aortic stenosis 12/24/2017   Echo Aug 2018  . Arthritis   . Asthma    allergy induced asthma  . Cardiac murmur 12/12/2017  . Cataract   . Cataract    left  . Complication of anesthesia    arrhythmia following colonoscopy  . Degenerative disc disease, lumbar   . Degenerative disc disease, lumbar   . Depression   . Diabetes mellitus   . Diabetic neuropathy ([place]) 11/16/2015  . Dysrhythmia    stenosis of left ventricle  . GERD (gastroesophageal reflux disease)   . H/O transfusion    patient was given 5 units of blood while at [facility], blood type O+  . History of chicken pox   . History of hiatal hernia   . History of measles, mumps, or rubella   . HOH (hard of hearing)    does not use hearing aides yet  . Hyperlipidemia   . Hypertension   . Irregular heartbeat   . LVH  (left ventricular hypertrophy) 12/24/2017   Echo Aug 2018  . Neuropathy   . Opiate use 11/16/2015  . Peripheral arterial disease ([place]) 05/06/2018   At rest, left > right; refer to vasc  . Pes planus of both feet 11/16/2015  . Plantar fasciitis of right foot 11/16/2015  . Wheezing     Past Surgical History:  Procedure Laterality Date  . ABDOMINAL HYSTERECTOMY    . APPLICATION OF WOUND VAC Left 07/23/2018   Procedure: APPLICATION OF WOUND VAC;  Surgeon: Algernon Huxley, MD;  Location: [facility] ORS;  Service: Vascular;  Laterality: Left;  . banck injections    . CATARACT EXTRACTION W/PHACO Right 05/30/2015   Procedure: CATARACT EXTRACTION PHACO AND INTRAOCULAR LENS PLACEMENT (IOC);  Surgeon: Birder Robson, MD;  Location: [facility] OR[facility];  Service: Ophthalmology;  Laterality: Right;  [place] 00:57 AP% 20.9 CDE 11.99 fluid pack lot #[id number]  . CHOLECYSTECTOMY  1970  . COLON SURGERY  2013   blocked colon  . COLONOSCOPY WITH PROPOFOL N/A 01/20/2018   Procedure: COLONOSCOPY WITH PROPOFOL;  Surgeon: Lucilla Lame, MD;  Location[facility];  Service: Endoscopy;  Laterality: N/A;  . ENDARTERECTOMY FEMORAL Left 07/08/2018  Procedure: ENDARTERECTOMY FEMORAL;  Surgeon: Algernon Huxley, MD;  Location: [facility] ORS;  Service: Vascular;  Laterality: Left;  . EYE SURGERY Right    cataract surgery  . FOOT FUSION Right 2018   metal in foot  . GALLBLADDER SURGERY  1970  . GASTRIC BYPASS  2010   lost 178 lbs and regained 40 lbs last few years  . HUMERUS FRACTURE SURGERY Right    metal plate with screws  . internal bleeding  2016   ulcer in past  . LOWER EXTREMITY ANGIOGRAM Left 07/08/2018   Procedure: LOWER EXTREMITY ANGIOGRAM;  Surgeon: Algernon Huxley, MD;  Location: [facility] ORS;  Service: Vascular;  Laterality: Left;  . LOWER EXTREMITY ANGIOGRAPHY Left 05/27/2018   Procedure: LOWER EXTREMITY ANGIOGRAPHY;  Surgeon: Algernon Huxley, MD;  Location: [facility];  Service: Cardiovascular;  Laterality: Left;  . LOWER  EXTREMITY ANGIOGRAPHY Right 12/07/2018   Procedure: LOWER EXTREMITY ANGIOGRAPHY;  Surgeon: Algernon Huxley, MD;  Location: [facility];  Service: Cardiovascular;  Laterality: Right;  . MOUTH SURGERY     root canals and crowns and extractions  . OVARY SURGERY    . SKIN GRAFT Right 2018   RT foot. foot has been rebuilt.  it is full of metal  . TENOTOMY ACHILLES TENDON Right    Percuntaneous. metal in foot  . TONSILLECTOMY    . WOUND DEBRIDEMENT Left 07/23/2018   Procedure: DEBRIDEMENT WOUND;  Surgeon: Algernon Huxley, MD;  Location: [facility] OR[facility];  Service: Vascular;  Laterality: Left;    There were no vitals filed for this visit.  Subjective Assessment - 03/10/19 1323    Subjective  Patient states her back pain 5.5-6/10  and has been aggrevated due to excessive amount of walking and moving yesterday. Patient states she left her friend's birthday earlier to be able to catch this treatment session. Patient also states her R posterior knee pain also bothers her alot today and she put her knee brace for pain control.    Pertinent History  Patient is a 73 y.o. female who presents to outpatient physical therapy with a referral for medical diagnosis of degeneration of lumbar intervertebral disc. This patient's chief complaints consist of chronic low back pain and foot pain, weakness, stiffness, leading to the following functional deficits: difficulty with with basic ADLs, IADLs, ambulation. She used to get injections to help control back pain but it has recently worsened due to being unable to get injections during [place] pandemic. The patient has been informed of current processes in place at [facility] to protect patients from Covid-19 exposure including social distancing, schedule modifications, and new cleaning procedures. After discussing their particular risk with a therapist based on the patient's personal risk factors, the patient has decided to proceed with in-person therapy. Pt is scheduled for  R LE angiography on 12/07/2018. She has recently started smoking again with the stress of COVID19 pandemic and feels strongly she needs to stop. Surgeon has stated in the past he will not complete surgery if she is smoking.  Relevant past medical history and comorbidities include underwent extensive vascular surgeries in Jan 2020 to improve blood flow, similar surgery for R leg scheduled 12/07/2018. Has a history of left carotid artery stenosis, but not enough to have surgery, has a history of multiple arterial surgeries, history of R cataract surgery, colon surgery, cholecystectomy, R foot skin graft, ovary surgery, gastric bypass, R foot fusion, abdominal hysterectomy; current smoker, plantar fasciitis, peripheral arterial disease, LVH,  diabetes mellitus with neuropathy, irregular heartbeat, hiatal hernia, GERD, stenosis of left heart ventricle, depression, lumbar degenerative disc disease, cardiac murmur, asthma, anxiety, obesity, HTN, and seasonal allergies She has been advised not to lift over 10 pounds by physician    Limitations  Sitting;Standing;Walking;House hold activities;Lifting    How long can you sit comfortably?  > 1 hour in almost any chair    How long can you stand comfortably?  4-5 minutes    How long can you walk comfortably?  2 minutes, depends on what she has with her. Able to walk at fair with rollator. To mailbox 120 feet causes shaking in R leg.      Diagnostic tests  MRI lumbar spine report 06/14/2017: "IMPRESSION: 1. Stable degenerative of lumbar spondylosis and scoliosis with multilevel disc disease and facet disease. 2. Stable right foraminal stenosis at L5-S1 due to right-sided disc    Currently in Pain?  Yes    Pain Score  6     Pain Location  Back    Pain Orientation  Left;Right    Pain Descriptors / Indicators  Aching    Pain Type  Chronic pain    Pain Onset  More than a month ago    Multiple Pain Sites  Yes    Pain Score  5    Pain Location  Knee   Right posterior knee       TREATMENT Therapeutic exercise:to centralize symptoms and improve ROM, strength, muscular endurance, and activity tolerance required for successful completion of functional activities.  NuStep level2 using bilateral upper and lower extremities. Seatsetting 8/handle setting7. For improved extremity mobility, muscular endurance, and activity tolerance; and to induce the analgesic effect of aerobic exercise, stimulate improved joint nutrition, and prepare body structures and systems for following interventions. x61mnutes. Instructed to attempt to keepSPM above60during subjective exam.Average SPM = 65, 0.35 miles.  Seated isometric hip adductionball(green children's ball), 5 second holdx 20 reps. Patient reports pain at lower back bilaterally,no back support, cuing for improved posture.  Seated hip abduction against greenband. No back support, cuing for improved posture2x15 on each side.  Further core exercises offered but patient refused due low back pain.  sit <>pronewith min A to get to and from prone position in a comfortable position with pillows placed under hips and feet formanual.Patient required prolonged timefor bed mobilitydue to pain, weakness, and fatigues.  Manual therapy: to reduce pain and tissue tension, improve range of motion, neuromodulation, in order to promote improved ability to complete functional activities.  Education provided for self-care using therapy cane for muscle pain and trigger point relief.  prone with headresting in blue cradle.Pillow under lower legs: STMto muscles thoracic spine and cervical spine including suboccipital muscles and bilateral upper traps and perispinal muscles to improve tension and pain for improved posture and decreased pain.  Cupping with hands along lower cervical and thoracic spine to improve relaxation.  Palpation along lowest lumbar spine with moderate discomfort.  Noted formost acute tenderness  noted at L 3-5 region.   Patient tolerated session well but limited participation due to pain at her lower back. Patient demonstrated improved core activation with sitting exercises however showing decreased activity tolerance with BUE overhand motion due to back pain. Patient continued feeling significant relief with manual therapy. Education provided for self-care related to pain control with massage cane. Patient would benefit from continued physical therapy to address self-care for her pain, strength, and endurance to improve functional mobility and ADLs tolerance.  PT Short Term Goals - 01/26/19 1615      PT SHORT TERM GOAL #1   Title  Be independent with initial home exercise program for self-management of symptoms.    Baseline  Initial HEP provided at [facility] (11/17/2018); was completing until vascular procedure, re-instated 12/15/2018 (12/15/2018); currently participating (01/26/2019)    Time  2    Period  Weeks    Status  Achieved    Target Date  01/19/19        PT Long Term Goals - 01/26/19 1616      PT LONG TERM GOAL #1   Title  Be independent with a long-term home exercise program for self-management of symptoms.     Baseline  Initial HEP provided at [facility] (11/17/2018); was doing better before vascular surgery, is working at returning to [facility] since procedure (12/15/2018); currently participating but not assigned final long term HEP yet (01/26/2019);    Time  12    Period  Weeks    Status  Partially Met    Target Date  04/20/19      PT LONG TERM GOAL #2   Title  Patient will improve 6MWT to equal or greater than 1000 feet with LRAD to demonstrate improved activity tolerance for community ambulation.     Baseline  6 Minute Walk Test: 200 feet with SPC and supervision. Stopped at 1:43 due to fatigue and leg pain. (11/17/2018); 457 feet SPC, on seated and one standing break (12/15/2018); 547 with SPC, 2 seated and one standing rest break (01/26/2019);    Time  12    Period  Weeks    Status   Partially Met    Target Date  04/20/19      PT LONG TERM GOAL #3   Title  Patient will demonstrate improved 5 Times Sit to Stand test to 11 seconds from chair height with no UE support to demonstrate improvement in LE power and functional strength for daily activities.    Baseline  5 Time Sit to Stand: 27 seconds from chair height no UE support (11/17/2018); 18 seconds, no UE support from chair-high plinth (12/15/2018); 16 seconds with no UE support from chair (01/26/2019);    Time  12    Period  Weeks    Status  Partially Met    Target Date  04/20/19      PT LONG TERM GOAL #4   Title  Patient will ambulate faster than 1.37ms on the 10 Meter Walk Test to improve ability to cross street safey for community participation.     Baseline  testing deferred to next session (11/17/2018); 1.37 m/sec with SPC (11/24/2018); 1.3 m/sec with SPC (12/15/2018; 01/26/2019);    Time  12    Period  Weeks    Status  Achieved    Target Date  04/20/19      PT LONG TERM GOAL #5   Title  Patient will score equal or greater than  25/30 on the Functional Gait Assessment to demonstrate low fall risk to improve her ability to participate safely in community activities.     Baseline  testing deferred to next session (11/17/2018); 11/30 high fall risk (11/24/2018); unable to measure due to time limitation (12/15/2018); 13/30 high fall risk (12/17/2018); 14/30 high fall risk (01/26/2019);    Time  12    Period  Weeks    Status  Partially Met    Target Date  04/20/19      Additional Long Term Goals  Additional Long Term Goals  Yes      PT LONG TERM GOAL #6   Title  Reduce pain to equal or less than 4/10 with functional activities to allow patient to complete valued functional tasks such as caring for dogs, grocery shopping, walking with less difficulty.    Baseline  8/10 (01/26/2019);    Time  12    Period  Weeks    Status  New    Target Date  04/20/19            Plan - 03/10/19 1624    Clinical Impression  Statement  Patient tolerated session well but limited participation due to pain at her lower back. Patient demonstrated improved core activation with sitting exercises however showing decreased activity tolerance with BUE overhand motion due to back pain. Patient continued feeling significant relief with manual therapy. Education provided for self-care related to pain control with massage cane. Patient would benefit from continued physical therapy to address self-care for her pain, strength, and endurance to improve functional mobility and ADLs tolerance.    Personal Factors and Comorbidities  Age;Comorbidity 3+;Fitness;Time since onset of injury/illness/exacerbation;Past/Current Experience;Other    Comorbidities  underwent extensive vascular surgeries in Jan 2020 to improve blood flow, similar surgery for R leg scheduled 12/07/2018. Has a history of left carotid artery stenosis, but not enough to have surgery, has a history of multiple arterial surgeries, history of R cataract surgery, colon surgery, cholecystectomy, R foot skin graft, ovary surgery, gastric bypass, R foot fusion, abdominal hysterectomy; current smoker, plantar fasciitis, peripheral arterial disease, LVH, diabetes mellitus with neuropathy, irregular heartbeat, hiatal hernia, GERD, stenosis of left heart ventricle, depression, lumbar degenerative disc disease, cardiac murmur, asthma, anxiety, obesity, HTN, and seasonal allergies She has been advised not to lift over 10 pounds by physician.      Examination-Activity Limitations  Bathing;Carry;Lift;Sit;Stand;Locomotion Level;Toileting;Dressing;Squat;Transfers;Stairs;Hygiene/Grooming;Other    Examination-Participation Restrictions  Community Activity;Volunteer;Cleaning;Interpersonal Relationship;Yard Work;Other    Rehab Potential  Fair    Clinical Impairments Affecting Rehab Potential  (+) motivated (-) muliple co morbidities, chronic condition    PT Frequency  2x / week    PT Duration  12 weeks     PT Treatment/Interventions  Moist Heat;Patient/family education;Neuromuscular re-education;Therapeutic exercise;Manual techniques;ADLs/Self Care Home Management;Electrical Stimulation;Gait training;Stair training;Functional mobility training;Therapeutic activities;Balance training;Passive range of motion;Dry needling;Energy conservation;Spinal Manipulations;Joint Manipulations;Cryotherapy    PT Next Visit Plan  continue manual and strengthening activities as tolerated    PT Home Exercise Plan  Medbridge Access Code: [email or web address]    Consulted and Agree with Plan of Care  Patient       Patient will benefit from skilled therapeutic intervention in order to improve the following deficits and impairments:  Decreased strength, Impaired flexibility, Decreased activity tolerance, Impaired perceived functional ability, Pain, Decreased endurance, Difficulty walking, Abnormal gait, Decreased knowledge of use of DME, Decreased skin integrity, Decreased range of motion, Impaired sensation, Improper body mechanics, Obesity, Postural dysfunction, Increased edema, Decreased mobility, Decreased balance, Cardiopulmonary status limiting activity, Decreased coordination  Visit Diagnosis: Muscle weakness (generalized)  Chronic bilateral low back pain, unspecified whether sciatica present  Difficulty in walking, not elsewhere classified  Other muscle spasm  History of falling     Problem List Patient Active Problem List   Diagnosis Date Noted  . Osteopenia 03/03/2019  . Lower extremity edema 07/31/2018  . Pressure injury of skin 07/24/2018  . Surgical site infection 07/23/2018  . Wound of left leg 07/23/2018  . Atherosclerosis of artery of extremity with rest pain (  [place]) 07/08/2018  . Shortness of breath 06/02/2018  . Bruit 06/02/2018  . Coronary artery disease of native artery of native heart with stable angina pectoris ([place]) 06/02/2018  . Atherosclerosis of native arteries of extremity with intermittent  claudication ([place]) 05/19/2018  . Peripheral arterial disease ([place]) 05/06/2018  . Personal history of colonic polyps   . Aortic stenosis 12/24/2017  . LVH (left ventricular hypertrophy) 12/24/2017  . Hypertension with heart disease 12/24/2017  . Left atrial dilatation 12/12/2017  . Cardiac murmur 12/12/2017  . Gastrojejunal ulcer 12/04/2017  . Hyperphosphatemia 12/04/2017  . Spinal stenosis of lumbar region 06/18/2017  . Degeneration of lumbar intervertebral disc 06/02/2017  . Assistance needed with transportation 05/26/2017  . Financial difficulties 05/26/2017  . Needs assistance with community resources 05/26/2017  . Moderate recurrent major depression ([place]) 05/26/2017  . Non-healing wound of lower extremity 02/06/2017  . Status post ankle arthrodesis 12/11/2016  . Controlled substance agreement broken 10/11/2016  . Low back pain 09/25/2016  . Osteoarthritis of right subtalar joint 07/11/2016  . Posterior tibial tendinitis of right lower extremity 07/11/2016  . Breast cancer screening 06/18/2016  . Knee pain 04/03/2016  . Hyponatremia 03/01/2016  . High triglycerides 12/24/2015  . Pes planus of both feet 11/16/2015  . Plantar fasciitis of right foot 11/16/2015  . Heel spur 11/16/2015  . Diabetic neuropathy ([place]) 11/16/2015  . Right ankle pain 11/16/2015  . Medication monitoring encounter 11/16/2015  . Chronic pain of multiple joints 08/15/2015  . Abnormal mammogram of right breast 06/19/2015  . Cerumen impaction 03/16/2015  . Hyperlipidemia 12/14/2014  . History of transfusion of packed RBC 12/14/2014  . Hx of smoking 12/14/2014  . Status post bariatric surgery 12/14/2014  . Morbid obesity ([place]) 12/14/2014  . Chronic radicular lumbar pain 12/14/2014  . History of GI bleed 11/12/2014  . GERD without esophagitis 11/12/2014  . History of small bowel obstruction 09/07/2014   Sherrilyn Rist, SPT 03/10/19, 4:53 PM    [facility] [address], [place], [address] Phone: [phone]   Fax:  [phone]  Name: Andrea Holden MRN: [id number] Date of Birth: 24-Nov-1945

## 2019-03-11 ENCOUNTER — Ambulatory Visit (INDEPENDENT_AMBULATORY_CARE_PROVIDER_SITE_OTHER): Payer: Medicare Other | Admitting: Family Medicine

## 2019-03-11 ENCOUNTER — Encounter: Payer: Self-pay | Admitting: Family Medicine

## 2019-03-11 DIAGNOSIS — E782 Mixed hyperlipidemia: Secondary | ICD-10-CM | POA: Diagnosis not present

## 2019-03-11 DIAGNOSIS — I70213 Atherosclerosis of native arteries of extremities with intermittent claudication, bilateral legs: Secondary | ICD-10-CM | POA: Diagnosis not present

## 2019-03-11 DIAGNOSIS — F331 Major depressive disorder, recurrent, moderate: Secondary | ICD-10-CM

## 2019-03-11 DIAGNOSIS — D649 Anemia, unspecified: Secondary | ICD-10-CM

## 2019-03-11 DIAGNOSIS — Z5181 Encounter for therapeutic drug level monitoring: Secondary | ICD-10-CM

## 2019-03-11 DIAGNOSIS — J3089 Other allergic rhinitis: Secondary | ICD-10-CM

## 2019-03-11 DIAGNOSIS — Z789 Other specified health status: Secondary | ICD-10-CM

## 2019-03-11 DIAGNOSIS — J45909 Unspecified asthma, uncomplicated: Secondary | ICD-10-CM

## 2019-03-11 MED ORDER — ALBUTEROL SULFATE HFA 108 (90 BASE) MCG/ACT IN AERS: 2.0000 | INHALATION_SPRAY | Freq: Four times a day (QID) | RESPIRATORY_TRACT | 2 refills | Status: DC | PRN

## 2019-03-11 MED ORDER — VARENICLINE TARTRATE 1 MG PO TABS: 1.0000 mg | ORAL_TABLET | Freq: Two times a day (BID) | ORAL | 2 refills | Status: DC

## 2019-03-11 MED ORDER — MONTELUKAST SODIUM 10 MG PO TABS: 10.0000 mg | ORAL_TABLET | Freq: Every day | ORAL | 3 refills | Status: DC

## 2019-03-11 NOTE — Patient Instructions (Addendum)
Continue to work on diet and exercise for her chronic conditions

## 2019-03-11 NOTE — Progress Notes (Signed)
Name: Andrea Holden   MRN: [id number]    DOB: 12-Nov-1945   Date:03/11/2019       Progress Note  Subjective:    Chief Complaint  Chief Complaint  Patient presents with  . Follow-up  . Hypertension  . Gastroesophageal Reflux  . Medication Refill    I connected with  Andrea Holden  on 03/11/19 at 10:20 AM EDT by a video enabled telemedicine application and verified that I am speaking with the correct person using two identifiers.  I discussed the limitations of evaluation and management by telemedicine and the availability of in person appointments. The patient expressed understanding and agreed to proceed. Staff also discussed with the patient that there may be a patient responsible charge related to this service.  Patient Location: at home Provider Location: [facility] in office Additional Individuals present: none  HPI   HTN  -  Can monitor at home - usually runs SBP 130-140's over 70's, taking meds, no concerns or SE  HLD - On lipitor, compliant, no SE Diet - trying to do keto diet, avoids fried foods fast food  PAD - on plavix doing well not able to tolerate ASA due to easy bruising, skin tears and bleeding  DM - diet controlled for several years  COPD/asthma - needs inhaler, using it intermittently before bedtime - she has always had URI/wheeze SOB cough (dx of asthma?) allergic to dust, plants, dogs, horses.  Needs refill today. Currently using 3-4 x per week and sometimes once a day (more often since smoking again) Has required maintenance inhalers in the past  Current Smoker - wants refill on chantix, will try and quit and cut back.  Sees cardiology Doing PT for mobility    Patient Active Problem List   Diagnosis Date Noted  . Osteopenia 03/03/2019  . Lower extremity edema 07/31/2018  . Pressure injury of skin 07/24/2018  . Surgical site infection 07/23/2018  . Wound of left leg 07/23/2018  . Atherosclerosis of artery of extremity with rest pain (HCC)  07/08/2018  . Shortness of breath 06/02/2018  . Bruit 06/02/2018  . Coronary artery disease of native artery of native heart with stable angina pectoris (HCC) 06/02/2018  . Atherosclerosis of native arteries of extremity with intermittent claudication (HCC) 05/19/2018  . Peripheral arterial disease (HCC) 05/06/2018  . Personal history of colonic polyps   . Aortic stenosis 12/24/2017  . LVH (left ventricular hypertrophy) 12/24/2017  . Hypertension with heart disease 12/24/2017  . Left atrial dilatation 12/12/2017  . Cardiac murmur 12/12/2017  . Gastrojejunal ulcer 12/04/2017  . Hyperphosphatemia 12/04/2017  . Spinal stenosis of lumbar region 06/18/2017  . Degeneration of lumbar intervertebral disc 06/02/2017  . Assistance needed with transportation 05/26/2017  . Financial difficulties 05/26/2017  . Needs assistance with community resources 05/26/2017  . Moderate recurrent major depression (HCC) 05/26/2017  . Non-healing wound of lower extremity 02/06/2017  . Status post ankle arthrodesis 12/11/2016  . Controlled substance agreement broken 10/11/2016  . Low back pain 09/25/2016  . Osteoarthritis of right subtalar joint 07/11/2016  . Posterior tibial tendinitis of right lower extremity 07/11/2016  . Breast cancer screening 06/18/2016  . Knee pain 04/03/2016  . Hyponatremia 03/01/2016  . High triglycerides 12/24/2015  . Pes planus of both feet 11/16/2015  . Plantar fasciitis of right foot 11/16/2015  . Heel spur 11/16/2015  . Diabetic neuropathy (HCC) 11/16/2015  . Right ankle pain 11/16/2015  . Medication monitoring encounter 11/16/2015  . Chronic pain of multiple  joints 08/15/2015  . Abnormal mammogram of right breast 06/19/2015  . Cerumen impaction 03/16/2015  . Hyperlipidemia 12/14/2014  . History of transfusion of packed RBC 12/14/2014  . Hx of smoking 12/14/2014  . Status post bariatric surgery 12/14/2014  . Morbid obesity (HCC) 12/14/2014  . Chronic radicular lumbar  pain 12/14/2014  . History of GI bleed 11/12/2014  . GERD without esophagitis 11/12/2014  . History of small bowel obstruction 09/07/2014    Past Surgical History:  Procedure Laterality Date  . ABDOMINAL HYSTERECTOMY    . APPLICATION OF WOUND VAC Left 07/23/2018   Procedure: APPLICATION OF WOUND VAC;  Surgeon: Annice Needy, MD;  Location: [facility] ORS;  Service: Vascular;  Laterality: Left;  . banck injections    . CATARACT EXTRACTION W/PHACO Right 05/30/2015   Procedure: CATARACT EXTRACTION PHACO AND INTRAOCULAR LENS PLACEMENT (IOC);  Surgeon: Galen Manila, MD;  Location: [facility] ORS;  Service: Ophthalmology;  Laterality: Right;  [place] 00:57 AP% 20.9 CDE 11.99 fluid pack lot #[id number]  . CHOLECYSTECTOMY  1970  . COLON SURGERY  2013   blocked colon  . COLONOSCOPY WITH PROPOFOL N/A 01/20/2018   Procedure: COLONOSCOPY WITH PROPOFOL;  Surgeon: Midge Minium, MD;  Location: [facility];  Service: Endoscopy;  Laterality: N/A;  . ENDARTERECTOMY FEMORAL Left 07/08/2018   Procedure: ENDARTERECTOMY FEMORAL;  Surgeon: Annice Needy, MD;  Location: [facility] ORS;  Service: Vascular;  Laterality: Left;  . EYE SURGERY Right    cataract surgery  . FOOT FUSION Right 2018   metal in foot  . GALLBLADDER SURGERY  1970  . GASTRIC BYPASS  2010   lost 178 lbs and regained 40 lbs last few years  . HUMERUS FRACTURE SURGERY Right    metal plate with screws  . internal bleeding  2016   ulcer in past  . LOWER EXTREMITY ANGIOGRAM Left 07/08/2018   Procedure: LOWER EXTREMITY ANGIOGRAM;  Surgeon: Annice Needy, MD;  Location: [facility] ORS;  Service: Vascular;  Laterality: Left;  . LOWER EXTREMITY ANGIOGRAPHY Left 05/27/2018   Procedure: LOWER EXTREMITY ANGIOGRAPHY;  Surgeon: Annice Needy, MD;  Location: [facility] INVASIVE CV LAB;  Service: Cardiovascular;  Laterality: Left;  . LOWER EXTREMITY ANGIOGRAPHY Right 12/07/2018   Procedure: LOWER EXTREMITY ANGIOGRAPHY;  Surgeon: Annice Needy, MD;  Location: [facility] INVASIVE CV LAB;  Service:  Cardiovascular;  Laterality: Right;  . MOUTH SURGERY     root canals and crowns and extractions  . OVARY SURGERY    . SKIN GRAFT Right 2018   RT foot. foot has been rebuilt.  it is full of metal  . TENOTOMY ACHILLES TENDON Right    Percuntaneous. metal in foot  . TONSILLECTOMY    . WOUND DEBRIDEMENT Left 07/23/2018   Procedure: DEBRIDEMENT WOUND;  Surgeon: Annice Needy, MD;  Location: [facility] ORS;  Service: Vascular;  Laterality: Left;    Family History  Problem Relation Age of Onset  . Asthma Mother   . COPD Mother   . Arthritis Father   . Depression Father   . Heart disease Father   . Hypertension Father   . Stroke Father   . Heart attack Father   . Arthritis Brother   . Depression Brother   . Diabetes Brother   . Heart disease Brother   . Hyperlipidemia Brother   . Hypertension Brother   . Stroke Brother   . Vision loss Brother   . Heart attack Brother   . Diabetes Maternal Grandmother   . Thyroid disease  Daughter   . Colitis Daughter   . Breast cancer Neg Hx     Social History   Socioeconomic History  . Marital status: Divorced    Spouse name: Not on file  . Number of children: 1  . Years of education: Not on file  . Highest education level: Associate degree: academic program  Occupational History  . Occupation: Oceanographer    Comment: [employer]  Social Needs  . Financial resource strain: Very hard  . Food insecurity    Worry: Never true    Inability: Never true  . Transportation needs    Medical: No    Non-medical: No  Tobacco Use  . Smoking status: Current Some Day Smoker    Packs/day: 0.50    Years: 55.00    Pack years: 27.50    Types: Cigarettes    Start date: 07/01/1960    Last attempt to quit: 11/22/2018    Years since quitting: 0.2  . Smokeless tobacco: Never Used  . Tobacco comment: trying to quit again, using chantix  Substance and Sexual Activity  . Alcohol use: Yes    Alcohol/week: 0.0 standard drinks     Comment: 2 drinks per month  . Drug use: Not Currently    Types: Marijuana    Comment: used for pain  . Sexual activity: Not Currently  Lifestyle  . Physical activity    Days per week: 7 days    Minutes per session: 10 min  . Stress: To some extent  Relationships  . Social Herbalist on phone: Three times a week    Gets together: Once a week    Attends religious service: 1 to 4 times per year    Active member of club or organization: Yes    Attends meetings of clubs or organizations: More than 4 times per year    Relationship status: Divorced  . Intimate partner violence    Fear of current or ex partner: No    Emotionally abused: No    Physically abused: No    Forced sexual activity: No  Other Topics Concern  . Not on file  Social History Narrative  . Not on file     Current Outpatient Medications:  .  acetaminophen (TYLENOL) 325 MG tablet, Take 325 mg by mouth every 6 (six) hours as needed. , Disp: , Rfl:  .  albuterol (VENTOLIN HFA) 108 (90 Base) MCG/ACT inhaler, Inhale 2 puffs into the lungs every 6 (six) hours as needed for wheezing or shortness of breath., Disp: 18 g, Rfl: 2 .  atorvastatin (LIPITOR) 10 MG tablet, Take 1 tablet (10 mg total) by mouth at bedtime., Disp: 90 tablet, Rfl: 3 .  clopidogrel (PLAVIX) 75 MG tablet, Take 1 tablet (75 mg total) by mouth daily., Disp: 90 tablet, Rfl: 3 .  co-enzyme Q-10 30 MG capsule, Take 100 mg by mouth daily. , Disp: , Rfl:  .  fluticasone (FLONASE) 50 MCG/ACT nasal spray, Place 2 sprays into both nostrils daily as needed., Disp: 16 g, Rfl: 4 .  gabapentin (NEURONTIN) 400 MG capsule, Take 1 capsule (400 mg total) by mouth 2 (two) times daily., Disp: 180 capsule, Rfl: 1 .  Glucosamine-Chondroit-Vit C-Mn (GLUCOSAMINE 1500 COMPLEX) CAPS, Take 1 capsule by mouth daily., Disp: 90 capsule, Rfl: 1 .  HYDROcodone-acetaminophen (NORCO) 5-325 MG tablet, Take 0.5 tablets by mouth every 6 (six) hours as needed for moderate pain.,  Disp: 30 tablet, Rfl: 0 .  lisinopril (ZESTRIL) 10 MG tablet, TAKE 1 TABLET BY MOUTH ONCE DAILY, Disp: 90 tablet, Rfl: 1 .  Multiple Vitamins-Minerals (MULTIVITAMIN ADULTS 50+) TABS, Take 1 tablet by mouth daily., Disp: 90 tablet, Rfl: 1 .  pantoprazole (PROTONIX) 20 MG tablet, TAKE 1 TABLET BY MOUTH ONCE DAILY, Disp: 90 tablet, Rfl: 0 .  sertraline (ZOLOFT) 100 MG tablet, TAKE 1 AND 1/2 TABLETS (150 MG TOTAL) BYMOUTH DAILY, Disp: 135 tablet, Rfl: 1 .  varenicline (CHANTIX) 1 MG tablet, Take 1 tablet (1 mg total) by mouth 2 (two) times daily., Disp: 60 tablet, Rfl: 2 .  Melatonin 5 MG TABS, Take 1 tablet by mouth at bedtime. , Disp: , Rfl:  .  vitamin C (ASCORBIC ACID) 500 MG tablet, Take 500 mg by mouth daily. , Disp: , Rfl:  .  Vitamin D, Cholecalciferol, 25 MCG (1000 UT) TABS, Take 1 tablet by mouth daily. (Patient not taking: Reported on 03/11/2019), Disp: 90 tablet, Rfl: 3  Allergies  Allergen Reactions  . Meloxicam Other (See Comments)    GI bleeding  . Nsaids Other (See Comments)    Gi bleeding  . Sitagliptin Hives and Itching    Januvia     I personally reviewed active problem list, medication list, allergies, family history, social history, health maintenance, notes from last encounter, lab results with the patient/caregiver today.  Review of Systems  Constitutional: Negative.   HENT: Negative.   Eyes: Negative.   Respiratory: Negative.   Cardiovascular: Negative.   Gastrointestinal: Negative.   Endocrine: Negative.   Genitourinary: Negative.   Musculoskeletal: Negative.   Skin: Negative.   Allergic/Immunologic: Negative.   Neurological: Negative.   Hematological: Negative.   Psychiatric/Behavioral: Negative.   All other systems reviewed and are negative.     Objective:    Virtual encounter, vitals limited, only able to obtain the following Today's Vitals   03/11/19 1032  PainSc: 6    There is no height or weight on file to calculate BMI. Nursing Note and  Vital Signs reviewed.  Physical Exam Vitals signs and nursing note reviewed.  Constitutional:      Comments: Very very talkative lady  Pulmonary:     Effort: Pulmonary effort is normal. No respiratory distress.     Comments: Speaks in full and complete sentences no audible wheeze or stridor Neurological:     Mental Status: She is alert.  Psychiatric:        Mood and Affect: Mood normal.     PE limited by telephone encounter   No results found for this or any previous visit (from the past 72 hour(s)).  PHQ2/9: Depression screen [facility] 2/9 03/11/2019 01/05/2019 12/18/2018 11/19/2018 11/02/2018  Decreased Interest 3 0 0 0 0  Down, Depressed, Hopeless 3 1 0 0 1  PHQ - 2 Score 6 1 0 0 1  Altered sleeping 0 0 0 - 0  Tired, decreased energy 0 0 0 - 1  Change in appetite 0 0 0 - 0  Feeling bad or failure about yourself  0 1 0 - 0  Trouble concentrating 0 1 0 - 0  Moving slowly or fidgety/restless 0 1 0 - 0  Suicidal thoughts 0 0 0 - 0  PHQ-9 Score 6 4 0 - 2  Difficult doing work/chores Not difficult at all Not difficult at all Not difficult at all - Not difficult at all  Some recent data might be hidden   PHQ-2/9 Result is positive.  Reviewed by me today on med  management  Fall Risk: Fall Risk  03/11/2019 01/05/2019 12/18/2018 11/02/2018 08/04/2018  Falls in the past year? 1 1 1 1 1   Comment - - - - -  Number falls in past yr: 1 1 1 1  0  Injury with Fall? 0 0 0 1 0  Comment - - - - -  Risk for fall due to : - - - History of fall(s) -  Risk for fall due to: Comment - - - - -  Follow up - Falls prevention discussed - - -     Assessment and Plan:     ICD-10-CM   1. Atherosclerosis of native artery of both lower extremities with intermittent claudication (HCC)  I70.213    seing specialist for management, on statin  2. Moderate recurrent major depression (HCC)  F33.1    slightly worse than prior PHQ9s but pt felt good on current meds, very pleasant and talkative, No SI, con't meds, possibly  a little isolated  3. Mixed hyperlipidemia  E78.2 COMPLETE METABOLIC PANEL WITH GFR    Lipid panel   on meds, tolerating, needs labs and in person f/up visit  4. Non-seasonal allergic rhinitis, unspecified trigger  J30.89 montelukast (SINGULAIR) 10 MG tablet  5. Medication monitoring encounter  Z51.81 COMPLETE METABOLIC PANEL WITH GFR    CBC with Differential/Platelet    Lipid panel    Hemoglobin A1c   due for labs  6. Uncomplicated asthma, unspecified asthma severity, unspecified whether persistent  J45.909 albuterol (VENTOLIN HFA) 108 (90 Base) MCG/ACT inhaler    montelukast (SINGULAIR) 10 MG tablet   add singulair, seems slightly uncontrolled with allergies, discussed black box warning and benefit with asthma/AR  7. Currently attempting to quit using tobacco  Z78.9 varenicline (CHANTIX) 1 MG tablet  8. Anemia, unspecified type  D64.9 CBC with Differential/Platelet  \  I discussed the assessment and treatment plan with the patient. The patient was provided an opportunity to ask questions and all were answered. The patient agreed with the plan and demonstrated an understanding of the instructions.  The patient was advised to call back or seek an in-person evaluation if the symptoms worsen or if the condition fails to improve as anticipated.  I provided 30 minutes of non-face-to-face time during this encounter.  Danelle BerryLeisa Raea Magallon, PA-C 03/10/2010:12 AM

## 2019-03-16 ENCOUNTER — Encounter: Payer: Medicare Other | Admitting: Physical Therapy

## 2019-03-18 ENCOUNTER — Ambulatory Visit: Payer: Medicare Other | Admitting: Physical Therapy

## 2019-03-23 ENCOUNTER — Encounter: Payer: Self-pay | Admitting: Physical Therapy

## 2019-03-23 ENCOUNTER — Ambulatory Visit: Payer: Medicare Other | Admitting: Physical Therapy

## 2019-03-23 ENCOUNTER — Other Ambulatory Visit: Payer: Self-pay

## 2019-03-23 DIAGNOSIS — R262 Difficulty in walking, not elsewhere classified: Secondary | ICD-10-CM

## 2019-03-23 DIAGNOSIS — Z9181 History of falling: Secondary | ICD-10-CM | POA: Diagnosis not present

## 2019-03-23 DIAGNOSIS — M6281 Muscle weakness (generalized): Secondary | ICD-10-CM | POA: Diagnosis not present

## 2019-03-23 DIAGNOSIS — M545 Low back pain: Secondary | ICD-10-CM | POA: Diagnosis not present

## 2019-03-23 DIAGNOSIS — G8929 Other chronic pain: Secondary | ICD-10-CM

## 2019-03-23 DIAGNOSIS — M62838 Other muscle spasm: Secondary | ICD-10-CM | POA: Diagnosis not present

## 2019-03-23 NOTE — Therapy (Signed)
[facility] [address] [place], [address] Phone: [phone]   Fax:  [phone]  Physical Therapy Treatment/Progress Note   Dates of reporting period  01/26/2019 to 03/23/2019   Patient Details  Name: Andrea Holden MRN: [id number] Date of Birth: [birth year] Referring Provider (PT): Fredderick Severance, NP    Encounter Date: 03/23/2019  PT End of Session - 03/23/19 1311    Visit Number  27    Number of Visits  36    Date for PT Re-Evaluation  04/20/19    Authorization Type  [facility] Medicare reporting period from 01/26/2019 (new reporting period from 03/23/2019)    Authorization Time Period  Current cert period 11/16/8414 - 04/20/2019 (latest PN: 01/26/2019)    Authorization - Visit Number  10    Authorization - Number of Visits  10    PT Start Time  1303    PT Stop Time  1358    PT Time Calculation (min)  55 min    Activity Tolerance  Patient limited by pain;Patient tolerated treatment well;Patient limited by fatigue    Behavior During Therapy  [facility] for tasks assessed/performed       Past Medical History:  Diagnosis Date  . Allergy    seasonal allergies  . Anemia 2014   needed 5 units of blood d/t passing out, weak  . Anxiety   . Aortic stenosis 12/24/2017   Echo Aug 2018  . Arthritis   . Asthma    allergy induced asthma  . Cardiac murmur 12/12/2017  . Cataract   . Cataract    left  . Complication of anesthesia    arrhythmia following colonoscopy  . Degenerative disc disease, lumbar   . Degenerative disc disease, lumbar   . Depression   . Diabetes mellitus   . Diabetic neuropathy ([place]) 11/16/2015  . Dysrhythmia    stenosis of left ventricle  . GERD (gastroesophageal reflux disease)   . H/O transfusion    patient was given 5 units of blood while at [facility], blood type O+  . History of chicken pox   . History of hiatal hernia   . History of measles, mumps, or rubella   . HOH (hard of  hearing)    does not use hearing aides yet  . Hyperlipidemia   . Hypertension   . Irregular heartbeat   . LVH (left ventricular hypertrophy) 12/24/2017   Echo Aug 2018  . Neuropathy   . Opiate use 11/16/2015  . Peripheral arterial disease ([place]) 05/06/2018   At rest, left > right; refer to vasc  . Pes planus of both feet 11/16/2015  . Plantar fasciitis of right foot 11/16/2015  . Wheezing     Past Surgical History:  Procedure Laterality Date  . ABDOMINAL HYSTERECTOMY    . APPLICATION OF WOUND VAC Left 07/23/2018   Procedure: APPLICATION OF WOUND VAC;  Surgeon: Algernon Huxley, MD;  Location: [facility];  Service: Vascular;  Laterality: Left;  . banck injections    . CATARACT EXTRACTION W/PHACO Right 05/30/2015   Procedure: CATARACT EXTRACTION PHACO AND INTRAOCULAR LENS PLACEMENT (IOC);  Surgeon: Birder Robson, MD;  Location: [facility];  Service: Ophthalmology;  Laterality: Right;  [place] 00:57 AP% 20.9 CDE 11.99 fluid pack lot #[id number]  . CHOLECYSTECTOMY  1970  . COLON SURGERY  2013   blocked colon  . COLONOSCOPY WITH PROPOFOL N/A 01/20/2018   Procedure: COLONOSCOPY WITH PROPOFOL;  Surgeon: Lucilla Lame, MD;  Location: [facility];  Service: Endoscopy;  Laterality: N/A;  . ENDARTERECTOMY FEMORAL Left 07/08/2018   Procedure: ENDARTERECTOMY FEMORAL;  Surgeon: Algernon Huxley, MD;  Location: [facility];  Service: Vascular;  Laterality: Left;  . EYE SURGERY Right    cataract surgery  . FOOT FUSION Right 2018   metal in foot  . GALLBLADDER SURGERY  1970  . GASTRIC BYPASS  2010   lost 178 lbs and regained 40 lbs last few years  . HUMERUS FRACTURE SURGERY Right    metal plate with screws  . internal bleeding  2016   ulcer in past  . LOWER EXTREMITY ANGIOGRAM Left 07/08/2018   Procedure: LOWER EXTREMITY ANGIOGRAM;  Surgeon: Algernon Huxley, MD;  Location: [facility];  Service: Vascular;  Laterality: Left;  . LOWER EXTREMITY ANGIOGRAPHY Left 05/27/2018   Procedure: LOWER EXTREMITY ANGIOGRAPHY;   Surgeon: Algernon Huxley, MD;  Location: [facility];  Service: Cardiovascular;  Laterality: Left;  . LOWER EXTREMITY ANGIOGRAPHY Right 12/07/2018   Procedure: LOWER EXTREMITY ANGIOGRAPHY;  Surgeon: Algernon Huxley, MD;  Location: [facility];  Service: Cardiovascular;  Laterality: Right;  . MOUTH SURGERY     root canals and crowns and extractions  . OVARY SURGERY    . SKIN GRAFT Right 2018   RT foot. foot has been rebuilt.  it is full of metal  . TENOTOMY ACHILLES TENDON Right    Percuntaneous. metal in foot  . TONSILLECTOMY    . WOUND DEBRIDEMENT Left 07/23/2018   Procedure: DEBRIDEMENT WOUND;  Surgeon: Algernon Huxley, MD;  Location: [facility];  Service: Vascular;  Laterality: Left;    There were no vitals filed for this visit.  Subjective Assessment - 03/23/19 1312    Subjective  Patient states her back pain 4-5/10. Patient started using a heating pad on her back and feel pretty good with afterwards. No recent falls since the last visit. Patient states pulling feeling at her R posterior knee and have ortho appointmnet scheduled with MD. States her R knee keeps giving out on her/feeling like it will give out on her.    Pertinent History  Patient is a 73 y.o. female who presents to outpatient physical therapy with a referral for medical diagnosis of degeneration of lumbar intervertebral disc. This patient's chief complaints consist of chronic low back pain and foot pain, weakness, stiffness, leading to the following functional deficits: difficulty with with basic ADLs, IADLs, ambulation. She used to get injections to help control back pain but it has recently worsened due to being unable to get injections during [place] pandemic. The patient has been informed of current processes in place at [facility] to protect patients from Covid-19 exposure including social distancing, schedule modifications, and new cleaning procedures. After discussing their particular risk with a therapist  based on the patient's personal risk factors, the patient has decided to proceed with in-person therapy. Pt is scheduled for R LE angiography on 12/07/2018. She has recently started smoking again with the stress of COVID19 pandemic and feels strongly she needs to stop. Surgeon has stated in the past he will not complete surgery if she is smoking.  Relevant past medical history and comorbidities include underwent extensive vascular surgeries in Jan 2020 to improve blood flow, similar surgery for R leg scheduled 12/07/2018. Has a history of left carotid artery stenosis, but not enough to have surgery, has a history of multiple arterial surgeries, history of R cataract surgery, colon surgery, cholecystectomy, R foot  skin graft, ovary surgery, gastric bypass, R foot fusion, abdominal hysterectomy; current smoker, plantar fasciitis, peripheral arterial disease, LVH, diabetes mellitus with neuropathy, irregular heartbeat, hiatal hernia, GERD, stenosis of left heart ventricle, depression, lumbar degenerative disc disease, cardiac murmur, asthma, anxiety, obesity, HTN, and seasonal allergies She has been advised not to lift over 10 pounds by physician    Limitations  Sitting;Standing;Walking;House hold activities;Lifting    How long can you sit comfortably?  > 1 hour in almost any chair    How long can you stand comfortably?  4-5 minutes    How long can you walk comfortably?  2 minutes, depends on what she has with her. Able to walk at fair with rollator. To mailbox 120 feet causes shaking in R leg.      Diagnostic tests  MRI lumbar spine report 06/14/2017: "IMPRESSION: 1. Stable degenerative of lumbar spondylosis and scoliosis with multilevel disc disease and facet disease. 2. Stable right foraminal stenosis at L5-S1 due to right-sided disc    Currently in Pain?  Yes    Pain Score  5     Pain Location  Back    Pain Onset  More than a month ago    Pain Frequency  Constant    Aggravating Factors   feels like when  walking legs are losing oxygen, walking, stairs, standing, bending, lifting    Pain Relieving Factors  oxycodone, sitting    Effect of Pain on Daily Activities  difficulty with all daily tasks involving standing, walking, sitting, unable to show dogs, difficulty moving chairs at dog shows, getting down on one knee, walk outside, walk dogs           TREATMENT:  Therapeutic exercise:to centralize symptoms and improve ROM, strength, muscular endurance, and activity tolerance required for successful completion of functional activities.  Filling out functional outcome measures (mODI 48% and FOTO 42; 15 min unbilled).   Functional testing to assess progress and impro ve functional endurance, strength, balance, and power to improve ability to complete ADLs. Required SBA- CGA for safety. Required cuing for directions and encouragement to continue. - 6MWT: 468 feet SPC, 2 seated break (one minute each) Patient states dizziness at 4th minute of walking. Vital on room air checked at the end of the walking. Manual 15(s) pulse rate is 84bpm. SpO2: 96%; BP:134/68 mmhg  - 5TSTS:   - First time: 22.29 seconds, no UE support from chair-high plinth (stand up straight)   - Second time 17.45s (lack of knee extension in standing) - 10MWT = 0.47 m/swith SPC    - FGA:14/30 Education on diagnosis, prognosis, POC, anatomy and physiology of current condition. Patient required frequent extended seated rest breaks due to [place] and redirection to stay on task due to easy distraction. Required SBA - CGA or standing activities due to fall risk.   Patient has attended 77 skilled physical therapy treatment sessions this episode of care and overall presents with lack of progress towards stated goals since last progress assessment. Subjectively, patient reports feeling increased of tiredness and pain at her lower back due to her recent "cold" and has not been able to complete HEP at home due to pain.  Objectively, patient demonstrates lack of progression in walking distance (6MWT), LE strength and power (5TSTS), but stated the same with balance (FGA). Patient has experienced interruptions in care due to her recent "cold" and flare up of her knee pain at R posterior thigh/knee/proximal calf. Patient has been showing good gain in the previous  PT treatment, and patient continues to present with significant strength, ROM, balance, pain, activity tolerance, high fall risk impairments that are limiting ability to complete her usual ADLs, IADLs, community participation, care for her dogs, manage her home, bend, lift, ambulate community distances and complete transfers without difficulty or falling. Patient will benefit from continued skilled physical therapy intervention to address current body structure impairments and activity limitations to improve function and work towards goals set in current POC in order to return to prior level of function or maximal functional improvement.       PT Education - 03/23/19 1312    Education provided  Yes    Education Details  Exercise purpose/form. Self management techniques    Person(s) Educated  Patient    Methods  Explanation;Demonstration;Tactile cues;Verbal cues    Comprehension  Verbalized understanding;Returned demonstration;Verbal cues required;Tactile cues required       PT Short Term Goals - 03/23/19 1413      PT SHORT TERM GOAL #1   Title  Be independent with initial home exercise program for self-management of symptoms.    Baseline  Initial HEP provided at IE (11/17/2018); was completing until vascular procedure, re-instated 12/15/2018 (12/15/2018); currently participating (01/26/2019); Partially doing HEP due the patient's recent health condition/weakness(03/23/2019);    Time  2    Period  Weeks    Status  Partially Met    Target Date  04/06/19        PT Long Term Goals - 03/23/19 1415      PT LONG TERM GOAL #1   Title  Be independent with a  long-term home exercise program for self-management of symptoms.     Baseline  Initial HEP provided at [facility] (11/17/2018); was doing better before vascular surgery, is working at returning to [facility] since procedure (12/15/2018); currently participating but not assigned final long term HEP yet (01/26/2019); currently participating but not assigned final long term HEP yet (03/23/2019);    Time  12    Period  Weeks    Status  Partially Met    Target Date  04/20/19      PT LONG TERM GOAL #2   Title  Patient will improve 6MWT to equal or greater than 1000 feet with LRAD to demonstrate improved activity tolerance for community ambulation.     Baseline  6 Minute Walk Test: 200 feet with SPC and supervision. Stopped at 1:43 due to fatigue and leg pain. (11/17/2018); 457 feet SPC, on seated and one standing break (12/15/2018); 547 with SPC, 2 seated and one standing rest break (01/26/2019); 468 feet with SPC, 2 seated breaks(03/23/2019);    Time  12    Period  Weeks    Status  Partially Met    Target Date  04/20/19      PT LONG TERM GOAL #3   Title  Patient will demonstrate improved 5 Times Sit to Stand test to 11 seconds from chair height with no UE support to demonstrate improvement in LE power and functional strength for daily activities.    Baseline  5 Time Sit to Stand: 27 seconds from chair height no UE support (11/17/2018); 18 seconds, no UE support from chair-high plinth (12/15/2018); 16 seconds with no UE support from chair (01/26/2019); 17.45s with lack of knee extension in standing(03/23/2019);    Time  12    Period  Weeks    Status  Partially Met    Target Date  04/20/19      PT LONG TERM GOAL #  4   Title  Patient will ambulate faster than 1.31ms on the 10 Meter Walk Test to improve ability to cross street safey for community participation.     Baseline  testing deferred to next session (11/17/2018); 1.37 m/sec with SPC (11/24/2018); 1.3 m/sec with SPC (12/15/2018; 01/26/2019); 0.431m (03/23/2019);    Time   12    Period  Weeks    Status  Achieved    Target Date  04/20/19      PT LONG TERM GOAL #5   Title  Patient will score equal or greater than  25/30 on the Functional Gait Assessment to demonstrate low fall risk to improve her ability to participate safely in community activities.     Baseline  testing deferred to next session (11/17/2018); 11/30 high fall risk (11/24/2018); unable to measure due to time limitation (12/15/2018); 13/30 high fall risk (12/17/2018); 14/30 high fall risk (01/26/2019); 14/30 high fall risk(03/23/2019);    Time  12    Period  Weeks    Status  Partially Met    Target Date  04/20/19      PT LONG TERM GOAL #6   Title  Reduce pain to equal or less than 4/10 with functional activities to allow patient to complete valued functional tasks such as caring for dogs, grocery shopping, walking with less difficulty.(03/23/2019);    Baseline  8/10 (01/26/2019); 6/10 (03/23/2019)    Time  12    Period  Weeks    Status  New    Target Date  04/20/19            Plan - 03/23/19 1440    Clinical Impression Statement  Patient has attended 10 skilled physical therapy treatment sessions this episode of care and overall presents with lack of progress towards stated goals. Subjectively, patient reports feeling increased of tiredness and pain at her lower back due to her recent "cold" and has not been able to complete HEP at home due to pain. Objectively, patient demonstrates lack of progression in walking distance (6MWT), LE strength and power (5TSTS), but stated the same with balance (FGA). Patient has experienced interruptions in care due to her recent "cold" and flare up of her knee pain at R posterior thigh/knee/proximal calf. Patient has been showing great gain in the previous episode of PT treatment, and patient continues to present with significant strength, ROM, balance, pain, activity tolerance, high fall risk impairments that are limiting ability to complete her usual ADLs, IADLs,  community participation, care for her dogs, manage her home, bend, lift, ambulate community distances and complete transfers without difficulty or falling. Patient will benefit from continued skilled physical therapy intervention to address current body structure impairments and activity limitations to improve function and work towards goals set in current POC in order to return to prior level of function or maximal functional improvement.    Personal Factors and Comorbidities  Age;Comorbidity 3+;Fitness;Time since onset of injury/illness/exacerbation;Past/Current Experience;Other    Comorbidities  underwent extensive vascular surgeries in Jan 2020 to improve blood flow, similar surgery for R leg scheduled 12/07/2018. Has a history of left carotid artery stenosis, but not enough to have surgery, has a history of multiple arterial surgeries, history of R cataract surgery, colon surgery, cholecystectomy, R foot skin graft, ovary surgery, gastric bypass, R foot fusion, abdominal hysterectomy; current smoker, plantar fasciitis, peripheral arterial disease, LVH, diabetes mellitus with neuropathy, irregular heartbeat, hiatal hernia, GERD, stenosis of left heart ventricle, depression, lumbar degenerative disc disease, cardiac murmur, asthma, anxiety,  obesity, HTN, and seasonal allergies She has been advised not to lift over 10 pounds by physician.      Examination-Activity Limitations  Bathing;Carry;Lift;Sit;Stand;Locomotion Level;Toileting;Dressing;Squat;Transfers;Stairs;Hygiene/Grooming;Other    Examination-Participation Restrictions  Community Activity;Volunteer;Cleaning;Interpersonal Relationship;Yard Work;Other    Rehab Potential  Fair    Clinical Impairments Affecting Rehab Potential  (+) motivated (-) muliple co morbidities, chronic condition    PT Frequency  2x / week    PT Duration  12 weeks    PT Treatment/Interventions  Moist Heat;Patient/family education;Neuromuscular re-education;Therapeutic  exercise;Manual techniques;ADLs/Self Care Home Management;Electrical Stimulation;Gait training;Stair training;Functional mobility training;Therapeutic activities;Balance training;Passive range of motion;Dry needling;Energy conservation;Spinal Manipulations;Joint Manipulations;Cryotherapy    PT Next Visit Plan  continue manual and strengthening activities as tolerated    PT Home Exercise Plan  Medbridge Access Code: [email or web address]    Consulted and Agree with Plan of Care  Patient       Patient will benefit from skilled therapeutic intervention in order to improve the following deficits and impairments:  Decreased strength, Impaired flexibility, Decreased activity tolerance, Impaired perceived functional ability, Pain, Decreased endurance, Difficulty walking, Abnormal gait, Decreased knowledge of use of DME, Decreased skin integrity, Decreased range of motion, Impaired sensation, Improper body mechanics, Obesity, Postural dysfunction, Increased edema, Decreased mobility, Decreased balance, Cardiopulmonary status limiting activity, Decreased coordination  Visit Diagnosis: Muscle weakness (generalized)  Chronic bilateral low back pain, unspecified whether sciatica present  Difficulty in walking, not elsewhere classified  History of falling     Problem List Patient Active Problem List   Diagnosis Date Noted  . Osteopenia 03/03/2019  . Lower extremity edema 07/31/2018  . Pressure injury of skin 07/24/2018  . Surgical site infection 07/23/2018  . Wound of left leg 07/23/2018  . Atherosclerosis of artery of extremity with rest pain ([place]) 07/08/2018  . Shortness of breath 06/02/2018  . Bruit 06/02/2018  . Coronary artery disease of native artery of native heart with stable angina pectoris ([place]) 06/02/2018  . Atherosclerosis of native arteries of extremity with intermittent claudication ([place]) 05/19/2018  . Peripheral arterial disease ([place]) 05/06/2018  . Personal history of colonic polyps   .  Aortic stenosis 12/24/2017  . LVH (left ventricular hypertrophy) 12/24/2017  . Hypertension with heart disease 12/24/2017  . Left atrial dilatation 12/12/2017  . Cardiac murmur 12/12/2017  . Gastrojejunal ulcer 12/04/2017  . Hyperphosphatemia 12/04/2017  . Spinal stenosis of lumbar region 06/18/2017  . Degeneration of lumbar intervertebral disc 06/02/2017  . Assistance needed with transportation 05/26/2017  . Financial difficulties 05/26/2017  . Needs assistance with community resources 05/26/2017  . Moderate recurrent major depression ([place]) 05/26/2017  . Non-healing wound of lower extremity 02/06/2017  . Status post ankle arthrodesis 12/11/2016  . Controlled substance agreement broken 10/11/2016  . Low back pain 09/25/2016  . Osteoarthritis of right subtalar joint 07/11/2016  . Posterior tibial tendinitis of right lower extremity 07/11/2016  . Breast cancer screening 06/18/2016  . Knee pain 04/03/2016  . Hyponatremia 03/01/2016  . High triglycerides 12/24/2015  . Pes planus of both feet 11/16/2015  . Plantar fasciitis of right foot 11/16/2015  . Heel spur 11/16/2015  . Diabetic neuropathy ([place]) 11/16/2015  . Right ankle pain 11/16/2015  . Medication monitoring encounter 11/16/2015  . Chronic pain of multiple joints 08/15/2015  . Abnormal mammogram of right breast 06/19/2015  . Cerumen impaction 03/16/2015  . Hyperlipidemia 12/14/2014  . History of transfusion of packed RBC 12/14/2014  . Hx of smoking 12/14/2014  . Status post bariatric surgery 12/14/2014  . Morbid obesity (  [place]) 12/14/2014  . Chronic radicular lumbar pain 12/14/2014  . History of GI bleed 11/12/2014  . History of small bowel obstruction 09/07/2014    Sherrilyn Rist, SPT 03/24/19, 10:07 AM  Everlean Alstrom. [name], PT, DPT 03/24/19, 10:08 AM   [facility] [address], [place], [address] Phone: [phone]   Fax:  [phone]  Name: Andrea Holden MRN: [id number] Date of Birth: [birth year]

## 2019-03-25 ENCOUNTER — Encounter: Payer: Self-pay | Admitting: Physical Therapy

## 2019-03-25 ENCOUNTER — Other Ambulatory Visit: Payer: Self-pay

## 2019-03-25 ENCOUNTER — Ambulatory Visit: Payer: Medicare Other | Admitting: Physical Therapy

## 2019-03-25 DIAGNOSIS — M6281 Muscle weakness (generalized): Secondary | ICD-10-CM | POA: Diagnosis not present

## 2019-03-25 DIAGNOSIS — R262 Difficulty in walking, not elsewhere classified: Secondary | ICD-10-CM

## 2019-03-25 DIAGNOSIS — Z9181 History of falling: Secondary | ICD-10-CM | POA: Diagnosis not present

## 2019-03-25 DIAGNOSIS — G8929 Other chronic pain: Secondary | ICD-10-CM | POA: Diagnosis not present

## 2019-03-25 DIAGNOSIS — M545 Low back pain: Secondary | ICD-10-CM | POA: Diagnosis not present

## 2019-03-25 DIAGNOSIS — M62838 Other muscle spasm: Secondary | ICD-10-CM | POA: Diagnosis not present

## 2019-03-25 NOTE — Therapy (Signed)
[facility] [address], [place], [address] Phone: [phone]   Fax:  [phone]  Physical Therapy Treatment  Patient Details  Name: Andrea Holden MRN: [id number] Date of Birth: 1946/03/29 Referring Provider (PT): Fredderick Severance, NP    Encounter Date: 03/25/2019  PT End of Session - 03/25/19 1135    Visit Number  28    Number of Visits  36    Date for PT Re-Evaluation  04/20/19    Authorization Type  [facility] Medicare reporting period from 01/26/2019 (new reporting period from 03/23/2019)    Authorization Time Period  Current cert period 8/41/6606 - 04/20/2019 (latest PN: 01/26/2019)    Authorization - Visit Number  1    Authorization - Number of Visits  10    PT Start Time  3016    PT Stop Time  1206    PT Time Calculation (min)  42 min    Activity Tolerance  Patient limited by pain;Patient tolerated treatment well;Patient limited by fatigue    Behavior During Therapy  [facility] for tasks assessed/performed       Past Medical History:  Diagnosis Date  . Allergy    seasonal allergies  . Anemia 2014   needed 5 units of blood d/t passing out, weak  . Anxiety   . Aortic stenosis 12/24/2017   Echo Aug 2018  . Arthritis   . Asthma    allergy induced asthma  . Cardiac murmur 12/12/2017  . Cataract   . Cataract    left  . Complication of anesthesia    arrhythmia following colonoscopy  . Degenerative disc disease, lumbar   . Degenerative disc disease, lumbar   . Depression   . Diabetes mellitus   . Diabetic neuropathy ([place]) 11/16/2015  . Dysrhythmia    stenosis of left ventricle  . GERD (gastroesophageal reflux disease)   . H/O transfusion    patient was given 5 units of blood while at [facility], blood type O+  . History of chicken pox   . History of hiatal hernia   . History of measles, mumps, or rubella   . HOH (hard of hearing)    does not use hearing aides yet  . Hyperlipidemia   .  Hypertension   . Irregular heartbeat   . LVH (left ventricular hypertrophy) 12/24/2017   Echo Aug 2018  . Neuropathy   . Opiate use 11/16/2015  . Peripheral arterial disease ([place]) 05/06/2018   At rest, left > right; refer to vasc  . Pes planus of both feet 11/16/2015  . Plantar fasciitis of right foot 11/16/2015  . Wheezing     Past Surgical History:  Procedure Laterality Date  . ABDOMINAL HYSTERECTOMY    . APPLICATION OF WOUND VAC Left 07/23/2018   Procedure: APPLICATION OF WOUND VAC;  Surgeon: Algernon Huxley, MD;  Location: [facility] ORS;  Service: Vascular;  Laterality: Left;  . banck injections    . CATARACT EXTRACTION W/PHACO Right 05/30/2015   Procedure: CATARACT EXTRACTION PHACO AND INTRAOCULAR LENS PLACEMENT (IOC);  Surgeon: Birder Robson, MD;  Location: [facility] OR[facility];  Service: Ophthalmology;  Laterality: Right;  [place] 00:57 AP% 20.9 CDE 11.99 fluid pack lot #[id number]  . CHOLECYSTECTOMY  1970  . COLON SURGERY  2013   blocked colon  . COLONOSCOPY WITH PROPOFOL N/A 01/20/2018   Procedure: COLONOSCOPY WITH PROPOFOL;  Surgeon: Lucilla Lame, MD;  Location: [facility];  Service: Endoscopy;  Laterality: N/A;  .  ENDARTERECTOMY FEMORAL Left 07/08/2018   Procedure: ENDARTERECTOMY FEMORAL;  Surgeon: Algernon Huxley, MD;  Location: [facility] OR[facility];  Service: Vascular;  Laterality: Left;  . EYE SURGERY Right    cataract surgery  . FOOT FUSION Right 2018   metal in foot  . GALLBLADDER SURGERY  1970  . GASTRIC BYPASS  2010   lost 178 lbs and regained 40 lbs last few years  . HUMERUS FRACTURE SURGERY Right    metal plate with screws  . internal bleeding  2016   ulcer in past  . LOWER EXTREMITY ANGIOGRAM Left 07/08/2018   Procedure: LOWER EXTREMITY ANGIOGRAM;  Surgeon: Algernon Huxley, MD;  Location: [facility];  Service: Vascular;  Laterality: Left;  . LOWER EXTREMITY ANGIOGRAPHY Left 05/27/2018   Procedure: LOWER EXTREMITY ANGIOGRAPHY;  Surgeon: Algernon Huxley, MD;  Location: [place] [facility];  Service:  Cardiovascular;  Laterality: Left;  . LOWER EXTREMITY ANGIOGRAPHY Right 12/07/2018   Procedure: LOWER EXTREMITY ANGIOGRAPHY;  Surgeon: Algernon Huxley, MD;  Location: [facility];  Service: Cardiovascular;  Laterality: Right;  . MOUTH SURGERY     root canals and crowns and extractions  . OVARY SURGERY    . SKIN GRAFT Right 2018   RT foot. foot has been rebuilt.  it is full of metal  . TENOTOMY ACHILLES TENDON Right    Percuntaneous. metal in foot  . TONSILLECTOMY    . WOUND DEBRIDEMENT Left 07/23/2018   Procedure: DEBRIDEMENT WOUND;  Surgeon: Algernon Huxley, MD;  Location: [facility];  Service: Vascular;  Laterality: Left;    There were no vitals filed for this visit.  Subjective Assessment - 03/25/19 1131    Subjective  Patient states her back pain 4.5/10. Patient states using the heating pad has helped a lot of her back pain but it only help to cerntain point and it woudn't work after that point of pain. R hip is hurting more than the L hip at 5.5/10. Patient also sates she has some stomach discomfort (cramp) and may need to go to restroom during the session.    Pertinent History  Patient is a 73 y.o. female who presents to outpatient physical therapy with a referral for medical diagnosis of degeneration of lumbar intervertebral disc. This patient's chief complaints consist of chronic low back pain and foot pain, weakness, stiffness, leading to the following functional deficits: difficulty with with basic ADLs, IADLs, ambulation. She used to get injections to help control back pain but it has recently worsened due to being unable to get injections during [place] pandemic. The patient has been informed of current processes in place at [facility] to protect patients from Covid-19 exposure including social distancing, schedule modifications, and new cleaning procedures. After discussing their particular risk with a therapist based on the patient's personal risk factors, the patient has decided  to proceed with in-person therapy. Pt is scheduled for R LE angiography on 12/07/2018. She has recently started smoking again with the stress of COVID19 pandemic and feels strongly she needs to stop. Surgeon has stated in the past he will not complete surgery if she is smoking.  Relevant past medical history and comorbidities include underwent extensive vascular surgeries in Jan 2020 to improve blood flow, similar surgery for R leg scheduled 12/07/2018. Has a history of left carotid artery stenosis, but not enough to have surgery, has a history of multiple arterial surgeries, history of R cataract surgery, colon surgery, cholecystectomy, R foot skin graft, ovary surgery, gastric  bypass, R foot fusion, abdominal hysterectomy; current smoker, plantar fasciitis, peripheral arterial disease, LVH, diabetes mellitus with neuropathy, irregular heartbeat, hiatal hernia, GERD, stenosis of left heart ventricle, depression, lumbar degenerative disc disease, cardiac murmur, asthma, anxiety, obesity, HTN, and seasonal allergies She has been advised not to lift over 10 pounds by physician    Limitations  Sitting;Standing;Walking;House hold activities;Lifting    How long can you sit comfortably?  > 1 hour in almost any chair    How long can you stand comfortably?  4-5 minutes    How long can you walk comfortably?  2 minutes, depends on what she has with her. Able to walk at fair with rollator. To mailbox 120 feet causes shaking in R leg.      Diagnostic tests  MRI lumbar spine report 06/14/2017: "IMPRESSION: 1. Stable degenerative of lumbar spondylosis and scoliosis with multilevel disc disease and facet disease. 2. Stable right foraminal stenosis at L5-S1 due to right-sided disc    Currently in Pain?  Yes    Pain Score  4     Pain Location  Back    Pain Onset  More than a month ago       TREATMENT Therapeutic exercise:to centralize symptoms and improve ROM, strength, muscular endurance, and activity tolerance required  for successful completion of functional activities.  NuStep level2(6 minutes) level 3(15mnutes) level 2 (only BLE 2 minutes)using bilateral upper and lower extremities. Seatsetting 8/handle setting7. For improved extremity mobility, muscular endurance, and activity tolerance; and to induce the analgesic effect of aerobic exercise, stimulate improved joint nutrition, and prepare body structures and systems for following interventions. Instructed to attempt to keepSPM above60during subjective exam.Average SPM =60, 0.478mes.  Restroom break per patient request due to stomach cramp request 1140 - 1253 (unbilled time)  UE strengthening without back rest to improve lumbar extension and core stabilization  Seated 2lb dumbbell shoulder flexion. 12reps  Seated 2lb dumbbell shoulder horizontal abduction. 12reps   Seated 2lb dumbbell shoulder alternating flexion to 90 degrees 12 reps each.   Seated 2lb dumbbell shoulder sapution to 100 degrees 12 reps each.  - Ambulation 150 feet from clinic to patient's car due to high fall risk per patient's request. Close SBA provided. Cuing provided for foot clearance and educated the sequencing to get inside of the car.   Patient tolerated the session well and didn't have increased of back pain at the end of the session. Patient unable to fully participate the session limited to late arrival time and stomach upset. However, patient showing improvement with pain management and good tolerance of the UE strengthening exercise for session today. Patient also demonstrated improved upright posture in sitting and standing. Patient would benefit from continued physical therapy to address self-care for her pain, strength, and endurance to improve functional mobility and ADLs tolerance.     PT Education - 03/25/19 1134    Education provided  Yes    Education Details  Exercise purpose/form. Self management techniques    Person(s) Educated  Patient    Methods   Explanation;Demonstration;Tactile cues;Verbal cues    Comprehension  Verbalized understanding;Returned demonstration;Verbal cues required;Tactile cues required       PT Short Term Goals - 03/23/19 1413      PT SHORT TERM GOAL #1   Title  Be independent with initial home exercise program for self-management of symptoms.    Baseline  Initial HEP provided at [facility] (11/17/2018); was completing until vascular procedure, re-instated 12/15/2018 (12/15/2018); currently participating (01/26/2019); Partially doing  HEP due the patient's recent health condition/weakness(03/23/2019);    Time  2    Period  Weeks    Status  Partially Met    Target Date  04/06/19        PT Long Term Goals - 03/23/19 1415      PT LONG TERM GOAL #1   Title  Be independent with a long-term home exercise program for self-management of symptoms.     Baseline  Initial HEP provided at [facility] (11/17/2018); was doing better before vascular surgery, is working at returning to [facility] since procedure (12/15/2018); currently participating but not assigned final long term HEP yet (01/26/2019); currently participating but not assigned final long term HEP yet (03/23/2019);    Time  12    Period  Weeks    Status  Partially Met    Target Date  04/20/19      PT LONG TERM GOAL #2   Title  Patient will improve 6MWT to equal or greater than 1000 feet with LRAD to demonstrate improved activity tolerance for community ambulation.     Baseline  6 Minute Walk Test: 200 feet with SPC and supervision. Stopped at 1:43 due to fatigue and leg pain. (11/17/2018); 457 feet SPC, on seated and one standing break (12/15/2018); 547 with SPC, 2 seated and one standing rest break (01/26/2019); 468 feet with SPC, 2 seated breaks(03/23/2019);    Time  12    Period  Weeks    Status  Partially Met    Target Date  04/20/19      PT LONG TERM GOAL #3   Title  Patient will demonstrate improved 5 Times Sit to Stand test to 11 seconds from chair height with no UE support to  demonstrate improvement in LE power and functional strength for daily activities.    Baseline  5 Time Sit to Stand: 27 seconds from chair height no UE support (11/17/2018); 18 seconds, no UE support from chair-high plinth (12/15/2018); 16 seconds with no UE support from chair (01/26/2019); 17.45s with lack of knee extension in standing(03/23/2019);    Time  12    Period  Weeks    Status  Partially Met    Target Date  04/20/19      PT LONG TERM GOAL #4   Title  Patient will ambulate faster than 1.32ms on the 10 Meter Walk Test to improve ability to cross street safey for community participation.     Baseline  testing deferred to next session (11/17/2018); 1.37 m/sec with SPC (11/24/2018); 1.3 m/sec with SPC (12/15/2018; 01/26/2019); 0.455m (03/23/2019);    Time  12    Period  Weeks    Status  Achieved    Target Date  04/20/19      PT LONG TERM GOAL #5   Title  Patient will score equal or greater than  25/30 on the Functional Gait Assessment to demonstrate low fall risk to improve her ability to participate safely in community activities.     Baseline  testing deferred to next session (11/17/2018); 11/30 high fall risk (11/24/2018); unable to measure due to time limitation (12/15/2018); 13/30 high fall risk (12/17/2018); 14/30 high fall risk (01/26/2019); 14/30 high fall risk(03/23/2019);    Time  12    Period  Weeks    Status  Partially Met    Target Date  04/20/19      PT LONG TERM GOAL #6   Title  Reduce pain to equal or less than 4/10 with functional activities  to allow patient to complete valued functional tasks such as caring for dogs, grocery shopping, walking with less difficulty.(03/23/2019);    Baseline  8/10 (01/26/2019); 6/10 (03/23/2019)    Time  12    Period  Weeks    Status  New    Target Date  04/20/19            Plan - 03/25/19 1136    Clinical Impression Statement  Patient tolerated the session well and didn't have increased of back pain at the end of the session. Patient  unable to fully participate the session limited to late arrival time and stomach upset. However, patient showing improvement with pain management and good tolerance of the UE strengthening exercise for session today. Patient also demonstrated improved upright posture in sitting and standing. Patient would benefit from continued physical therapy to address self-care for her pain, strength, and endurance to improve functional mobility and ADLs tolerance.    Personal Factors and Comorbidities  Age;Comorbidity 3+;Fitness;Time since onset of injury/illness/exacerbation;Past/Current Experience;Other    Comorbidities  underwent extensive vascular surgeries in Jan 2020 to improve blood flow, similar surgery for R leg scheduled 12/07/2018. Has a history of left carotid artery stenosis, but not enough to have surgery, has a history of multiple arterial surgeries, history of R cataract surgery, colon surgery, cholecystectomy, R foot skin graft, ovary surgery, gastric bypass, R foot fusion, abdominal hysterectomy; current smoker, plantar fasciitis, peripheral arterial disease, LVH, diabetes mellitus with neuropathy, irregular heartbeat, hiatal hernia, GERD, stenosis of left heart ventricle, depression, lumbar degenerative disc disease, cardiac murmur, asthma, anxiety, obesity, HTN, and seasonal allergies She has been advised not to lift over 10 pounds by physician.      Examination-Activity Limitations  Bathing;Carry;Lift;Sit;Stand;Locomotion Level;Toileting;Dressing;Squat;Transfers;Stairs;Hygiene/Grooming;Other    Examination-Participation Restrictions  Community Activity;Volunteer;Cleaning;Interpersonal Relationship;Yard Work;Other    Rehab Potential  Fair    Clinical Impairments Affecting Rehab Potential  (+) motivated (-) muliple co morbidities, chronic condition    PT Frequency  2x / week    PT Duration  12 weeks    PT Treatment/Interventions  Moist Heat;Patient/family education;Neuromuscular  re-education;Therapeutic exercise;Manual techniques;ADLs/Self Care Home Management;Electrical Stimulation;Gait training;Stair training;Functional mobility training;Therapeutic activities;Balance training;Passive range of motion;Dry needling;Energy conservation;Spinal Manipulations;Joint Manipulations;Cryotherapy    PT Next Visit Plan  continue manual and strengthening activities as tolerated    PT Home Exercise Plan  Medbridge Access Code: [email or web address]    Consulted and Agree with Plan of Care  Patient       Patient will benefit from skilled therapeutic intervention in order to improve the following deficits and impairments:  Decreased strength, Impaired flexibility, Decreased activity tolerance, Impaired perceived functional ability, Pain, Decreased endurance, Difficulty walking, Abnormal gait, Decreased knowledge of use of DME, Decreased skin integrity, Decreased range of motion, Impaired sensation, Improper body mechanics, Obesity, Postural dysfunction, Increased edema, Decreased mobility, Decreased balance, Cardiopulmonary status limiting activity, Decreased coordination  Visit Diagnosis: Muscle weakness (generalized)  Chronic bilateral low back pain, unspecified whether sciatica present  Difficulty in walking, not elsewhere classified  History of falling     Problem List Patient Active Problem List   Diagnosis Date Noted  . Osteopenia 03/03/2019  . Lower extremity edema 07/31/2018  . Pressure injury of skin 07/24/2018  . Surgical site infection 07/23/2018  . Wound of left leg 07/23/2018  . Atherosclerosis of artery of extremity with rest pain ([place]) 07/08/2018  . Shortness of breath 06/02/2018  . Bruit 06/02/2018  . Coronary artery disease of native artery of native heart with stable angina  pectoris ([place]) 06/02/2018  . Atherosclerosis of native arteries of extremity with intermittent claudication ([place]) 05/19/2018  . Peripheral arterial disease ([place]) 05/06/2018  . Personal history of  colonic polyps   . Aortic stenosis 12/24/2017  . LVH (left ventricular hypertrophy) 12/24/2017  . Hypertension with heart disease 12/24/2017  . Left atrial dilatation 12/12/2017  . Cardiac murmur 12/12/2017  . Gastrojejunal ulcer 12/04/2017  . Hyperphosphatemia 12/04/2017  . Spinal stenosis of lumbar region 06/18/2017  . Degeneration of lumbar intervertebral disc 06/02/2017  . Assistance needed with transportation 05/26/2017  . Financial difficulties 05/26/2017  . Needs assistance with community resources 05/26/2017  . Moderate recurrent major depression ([place]) 05/26/2017  . Non-healing wound of lower extremity 02/06/2017  . Status post ankle arthrodesis 12/11/2016  . Controlled substance agreement broken 10/11/2016  . Low back pain 09/25/2016  . Osteoarthritis of right subtalar joint 07/11/2016  . Posterior tibial tendinitis of right lower extremity 07/11/2016  . Breast cancer screening 06/18/2016  . Knee pain 04/03/2016  . Hyponatremia 03/01/2016  . High triglycerides 12/24/2015  . Pes planus of both feet 11/16/2015  . Plantar fasciitis of right foot 11/16/2015  . Heel spur 11/16/2015  . Diabetic neuropathy ([place]) 11/16/2015  . Right ankle pain 11/16/2015  . Medication monitoring encounter 11/16/2015  . Chronic pain of multiple joints 08/15/2015  . Abnormal mammogram of right breast 06/19/2015  . Cerumen impaction 03/16/2015  . Hyperlipidemia 12/14/2014  . History of transfusion of packed RBC 12/14/2014  . Hx of smoking 12/14/2014  . Status post bariatric surgery 12/14/2014  . Morbid obesity ([place]) 12/14/2014  . Chronic radicular lumbar pain 12/14/2014  . History of GI bleed 11/12/2014  . History of small bowel obstruction 09/07/2014    Sherrilyn Rist, SPT 03/25/2019, 3:16 PM   Everlean Alstrom. [name], PT, DPT 03/25/19, 3:20 PM  [facility] [address] [place], [address] Phone: [phone]   Fax:   [phone]  Name: Andrea Holden MRN: [id number] Date of Birth: 1945/10/31

## 2019-03-29 DIAGNOSIS — S86111A Strain of other muscle(s) and tendon(s) of posterior muscle group at lower leg level, right leg, initial encounter: Secondary | ICD-10-CM | POA: Diagnosis not present

## 2019-03-30 ENCOUNTER — Ambulatory Visit: Payer: Medicare Other | Admitting: Physical Therapy

## 2019-03-30 ENCOUNTER — Other Ambulatory Visit: Payer: Self-pay

## 2019-03-30 ENCOUNTER — Encounter: Payer: Self-pay | Admitting: Physical Therapy

## 2019-03-30 DIAGNOSIS — M545 Low back pain, unspecified: Secondary | ICD-10-CM

## 2019-03-30 DIAGNOSIS — R262 Difficulty in walking, not elsewhere classified: Secondary | ICD-10-CM

## 2019-03-30 DIAGNOSIS — G8929 Other chronic pain: Secondary | ICD-10-CM

## 2019-03-30 DIAGNOSIS — M6281 Muscle weakness (generalized): Secondary | ICD-10-CM

## 2019-03-30 DIAGNOSIS — M62838 Other muscle spasm: Secondary | ICD-10-CM | POA: Diagnosis not present

## 2019-03-30 DIAGNOSIS — Z9181 History of falling: Secondary | ICD-10-CM | POA: Diagnosis not present

## 2019-03-30 NOTE — Therapy (Signed)
[facility] [address] [place], [address] Phone: [phone]   Fax:  [phone]  Physical Therapy Treatment  Patient Details  Name: Andrea Holden MRN: [id number] Date of Birth: 1945-10-28 Referring Provider (PT): Fredderick Severance, NP    Encounter Date: 03/30/2019  PT End of Session - 03/30/19 1305    Visit Number  29    Number of Visits  36    Date for PT Re-Evaluation  04/20/19    Authorization Type  [facility] Medicare reporting period from 01/26/2019 (new reporting period from 03/23/2019)    Authorization Time Period  Current cert period 3/57/0177 - 04/20/2019 (latest PN: 01/26/2019)    Authorization - Visit Number  2    Authorization - Number of Visits  10    PT Start Time  1306    PT Stop Time  1346    PT Time Calculation (min)  40 min    Activity Tolerance  Patient limited by pain;Patient tolerated treatment well;Patient limited by fatigue    Behavior During Therapy  [facility] for tasks assessed/performed       Past Medical History:  Diagnosis Date  . Allergy    seasonal allergies  . Anemia 2014   needed 5 units of blood d/t passing out, weak  . Anxiety   . Aortic stenosis 12/24/2017   Echo Aug 2018  . Arthritis   . Asthma    allergy induced asthma  . Cardiac murmur 12/12/2017  . Cataract   . Cataract    left  . Complication of anesthesia    arrhythmia following colonoscopy  . Degenerative disc disease, lumbar   . Degenerative disc disease, lumbar   . Depression   . Diabetes mellitus   . Diabetic neuropathy ([place]) 11/16/2015  . Dysrhythmia    stenosis of left ventricle  . GERD (gastroesophageal reflux disease)   . H/O transfusion    patient was given 5 units of blood while at [place], blood type O+  . History of chicken pox   . History of hiatal hernia   . History of measles, mumps, or rubella   . HOH (hard of hearing)    does not use hearing aides yet  . Hyperlipidemia   .  Hypertension   . Irregular heartbeat   . LVH (left ventricular hypertrophy) 12/24/2017   Echo Aug 2018  . Neuropathy   . Opiate use 11/16/2015  . Peripheral arterial disease ([place]) 05/06/2018   At rest, left > right; refer to vasc  . Pes planus of both feet 11/16/2015  . Plantar fasciitis of right foot 11/16/2015  . Wheezing     Past Surgical History:  Procedure Laterality Date  . ABDOMINAL HYSTERECTOMY    . APPLICATION OF WOUND VAC Left 07/23/2018   Procedure: APPLICATION OF WOUND VAC;  Surgeon: Algernon Huxley, MD;  Location: [facility] ORS;  Service: Vascular;  Laterality: Left;  . banck injections    . CATARACT EXTRACTION W/PHACO Right 05/30/2015   Procedure: CATARACT EXTRACTION PHACO AND INTRAOCULAR LENS PLACEMENT (IOC);  Surgeon: Birder Robson, MD;  Location: [facility] ORS;  Service: Ophthalmology;  Laterality: Right;  [place] 00:57 AP% 20.9 CDE 11.99 fluid pack lot #[id number]  . CHOLECYSTECTOMY  1970  . COLON SURGERY  2013   blocked colon  . COLONOSCOPY WITH PROPOFOL N/A 01/20/2018   Procedure: COLONOSCOPY WITH PROPOFOL;  Surgeon: Lucilla Lame, MD;  Location: [facility]OS[facility];  Service: Endoscopy;  Laterality: N/A;  .  ENDARTERECTOMY FEMORAL Left 07/08/2018   Procedure: ENDARTERECTOMY FEMORAL;  Surgeon: Algernon Huxley, MD;  Location: [facility] ORS;  Service: Vascular;  Laterality: Left;  . EYE SURGERY Right    cataract surgery  . FOOT FUSION Right 2018   metal in foot  . GALLBLADDER SURGERY  1970  . GASTRIC BYPASS  2010   lost 178 lbs and regained 40 lbs last few years  . HUMERUS FRACTURE SURGERY Right    metal plate with screws  . internal bleeding  2016   ulcer in past  . LOWER EXTREMITY ANGIOGRAM Left 07/08/2018   Procedure: LOWER EXTREMITY ANGIOGRAM;  Surgeon: Algernon Huxley, MD;  Location: [facility] ORS;  Service: Vascular;  Laterality: Left;  . LOWER EXTREMITY ANGIOGRAPHY Left 05/27/2018   Procedure: LOWER EXTREMITY ANGIOGRAPHY;  Surgeon: Algernon Huxley, MD;  Location: [facility];  Service:  Cardiovascular;  Laterality: Left;  . LOWER EXTREMITY ANGIOGRAPHY Right 12/07/2018   Procedure: LOWER EXTREMITY ANGIOGRAPHY;  Surgeon: Algernon Huxley, MD;  Location: [facility];  Service: Cardiovascular;  Laterality: Right;  . MOUTH SURGERY     root canals and crowns and extractions  . OVARY SURGERY    . SKIN GRAFT Right 2018   RT foot. foot has been rebuilt.  it is full of metal  . TENOTOMY ACHILLES TENDON Right    Percuntaneous. metal in foot  . TONSILLECTOMY    . WOUND DEBRIDEMENT Left 07/23/2018   Procedure: DEBRIDEMENT WOUND;  Surgeon: Algernon Huxley, MD;  Location: [facility] ORS;  Service: Vascular;  Laterality: Left;    There were no vitals filed for this visit.  Subjective Assessment - 03/30/19 1312    Subjective  Patient states her right side back pain 6/10. Both hips felt good today and no pain. Heating pad is not helping much right now. Saw orthopedic physician Dr. Mack Guise who gave her a referral to PT for a R calf strain so she brought it in to the clinic (see chart). States she has pain at the back of her R distal hamstrings to palpation in the clinic.    Pertinent History  Patient is a 73 y.o. female who presents to outpatient physical therapy with a referral for medical diagnosis of degeneration of lumbar intervertebral disc. This patient's chief complaints consist of chronic low back pain and foot pain, weakness, stiffness, leading to the following functional deficits: difficulty with with basic ADLs, IADLs, ambulation. She used to get injections to help control back pain but it has recently worsened due to being unable to get injections during [place] pandemic. The patient has been informed of current processes in place at [facility] to protect patients from Covid-19 exposure including social distancing, schedule modifications, and new cleaning procedures. After discussing their particular risk with a therapist based on the patient's personal risk factors, the patient has  decided to proceed with in-person therapy. Pt is scheduled for R LE angiography on 12/07/2018. She has recently started smoking again with the stress of COVID19 pandemic and feels strongly she needs to stop. Surgeon has stated in the past he will not complete surgery if she is smoking.  Relevant past medical history and comorbidities include underwent extensive vascular surgeries in Jan 2020 to improve blood flow, similar surgery for R leg scheduled 12/07/2018. Has a history of left carotid artery stenosis, but not enough to have surgery, has a history of multiple arterial surgeries, history of R cataract surgery, colon surgery, cholecystectomy, R foot skin graft, ovary  surgery, gastric bypass, R foot fusion, abdominal hysterectomy; current smoker, plantar fasciitis, peripheral arterial disease, LVH, diabetes mellitus with neuropathy, irregular heartbeat, hiatal hernia, GERD, stenosis of left heart ventricle, depression, lumbar degenerative disc disease, cardiac murmur, asthma, anxiety, obesity, HTN, and seasonal allergies She has been advised not to lift over 10 pounds by physician    Limitations  Sitting;Standing;Walking;House hold activities;Lifting    How long can you sit comfortably?  > 1 hour in almost any chair    How long can you stand comfortably?  4-5 minutes    How long can you walk comfortably?  2 minutes, depends on what she has with her. Able to walk at fair with rollator. To mailbox 120 feet causes shaking in R leg.      Diagnostic tests  MRI lumbar spine report 06/14/2017: "IMPRESSION: 1. Stable degenerative of lumbar spondylosis and scoliosis with multilevel disc disease and facet disease. 2. Stable right foraminal stenosis at L5-S1 due to right-sided disc    Currently in Pain?  Yes    Pain Score  6     Pain Location  Back    Pain Orientation  Right;Mid;Lower    Pain Onset  More than a month ago      TREATMENT Therapeutic exercise:to centralize symptoms and improve ROM, strength, muscular  endurance, and activity tolerance required for successful completion of functional activities.  NuStep level3(4 minutes with BUEs, and 4 minutes without BUEs). Seatsetting 8/handle setting7. For improved extremity mobility, muscular endurance, and activity tolerance; and to induce the analgesic effect of aerobic exercise, stimulate improved joint nutrition, and prepare body structures and systems for following interventions. Instructed to attempt to keepSPM above60during subjective exam.Average SPM =53, 0.53mles. UE strengthening without back rest to improve lumbar extension and core stabilization  Seated 2lb(2set)/3lb(1set) dumbbell shoulder flexion. 8 reps each set  Seated 2lb(2set)/3lb(1set) dumbbell shoulder horizontal abduction. 8 reps each set   Seated 2lb(2set)/3lb(1set) each dumbbell shoulder flexion to 90 degrees 8 reps  each set  Seated 3lb dumbbell shoulder sapution to 100 degrees 2x8 reps each.  - Ambulation 150 feet from clinic to patient's car due to high fall risk per patient's request. Close SBA provided. Cuing provided for foot clearance and educated the sequencing to get inside of the car.  - Sit <> stand from chair deferred per patient requests due to patient sates her recent doctor's visit doesn't support sit <> stand exercises.  - R LE (hamstring, DF)stretching 4x30s in sitting with red thera rope under her R foot. BUEs holding to assist R foot dorsiflexion.  - Seated thoracic extension stretching with BUE assisted (elbow flexion and abduction from midline) 20 reps.  - Education provided to tens units per patient request for pain control.   Patient tolerated the session good but presented moderate increase of pain at her back at the end of the session. Patient demonstrated progression with strength training for postural strength but declined to perform sit to stand LE strengthening exercises due to her R LE pain with report that her orthopedic physician did not seem  to like that exercise when she described it to him in the clinic. Patient also presented increased activity tolerance but patient self-limited her participations to therapy due to her pain at back. Patient would benefit from continued physical therapy to address self-care for her pain, strength, and endurance to improve functional mobility and ADLs tolerance.     PT Education - 03/30/19 1134    Education provided  Yes    Education  Details  Exercise purpose/form. Self management techniques    Person(s) Educated  Patient    Methods  Explanation;Demonstration;Tactile cues;Verbal cues    Comprehension  Verbalized understanding;Returned demonstration;Verbal cues required;Tactile cues required       PT Short Term Goals - 03/23/19 1413      PT SHORT TERM GOAL #1   Title  Be independent with initial home exercise program for self-management of symptoms.    Baseline  Initial HEP provided at [facility] (11/17/2018); was completing until vascular procedure, re-instated 12/15/2018 (12/15/2018); currently participating (01/26/2019); Partially doing HEP due the patient's recent health condition/weakness(03/23/2019);    Time  2    Period  Weeks    Status  Partially Met    Target Date  04/06/19        PT Long Term Goals - 03/23/19 1415      PT LONG TERM GOAL #1   Title  Be independent with a long-term home exercise program for self-management of symptoms.     Baseline  Initial HEP provided at [facility] (11/17/2018); was doing better before vascular surgery, is working at returning to [facility] since procedure (12/15/2018); currently participating but not assigned final long term HEP yet (01/26/2019); currently participating but not assigned final long term HEP yet (03/23/2019);    Time  12    Period  Weeks    Status  Partially Met    Target Date  04/20/19      PT LONG TERM GOAL #2   Title  Patient will improve 6MWT to equal or greater than 1000 feet with LRAD to demonstrate improved activity tolerance for community  ambulation.     Baseline  6 Minute Walk Test: 200 feet with SPC and supervision. Stopped at 1:43 due to fatigue and leg pain. (11/17/2018); 457 feet SPC, on seated and one standing break (12/15/2018); 547 with SPC, 2 seated and one standing rest break (01/26/2019); 468 feet with SPC, 2 seated breaks(03/23/2019);    Time  12    Period  Weeks    Status  Partially Met    Target Date  04/20/19      PT LONG TERM GOAL #3   Title  Patient will demonstrate improved 5 Times Sit to Stand test to 11 seconds from chair height with no UE support to demonstrate improvement in LE power and functional strength for daily activities.    Baseline  5 Time Sit to Stand: 27 seconds from chair height no UE support (11/17/2018); 18 seconds, no UE support from chair-high plinth (12/15/2018); 16 seconds with no UE support from chair (01/26/2019); 17.45s with lack of knee extension in standing(03/23/2019);    Time  12    Period  Weeks    Status  Partially Met    Target Date  04/20/19      PT LONG TERM GOAL #4   Title  Patient will ambulate faster than 1.85ms on the 10 Meter Walk Test to improve ability to cross street safey for community participation.     Baseline  testing deferred to next session (11/17/2018); 1.37 m/sec with SPC (11/24/2018); 1.3 m/sec with SPC (12/15/2018; 01/26/2019); 0.453m (03/23/2019);    Time  12    Period  Weeks    Status  Achieved    Target Date  04/20/19      PT LONG TERM GOAL #5   Title  Patient will score equal or greater than  25/30 on the Functional Gait Assessment to demonstrate low fall risk to improve her ability  to participate safely in community activities.     Baseline  testing deferred to next session (11/17/2018); 11/30 high fall risk (11/24/2018); unable to measure due to time limitation (12/15/2018); 13/30 high fall risk (12/17/2018); 14/30 high fall risk (01/26/2019); 14/30 high fall risk(03/23/2019);    Time  12    Period  Weeks    Status  Partially Met    Target Date  04/20/19       PT LONG TERM GOAL #6   Title  Reduce pain to equal or less than 4/10 with functional activities to allow patient to complete valued functional tasks such as caring for dogs, grocery shopping, walking with less difficulty.(03/23/2019);    Baseline  8/10 (01/26/2019); 6/10 (03/23/2019)    Time  12    Period  Weeks    Status  New    Target Date  04/20/19            Plan - 03/30/19 1422    Clinical Impression Statement  Patient tolerated the session good but presented moderate increase of pain at her back at the end of the session. Patient demonstrated progression with strength training but refused LE strengthening exercises due to her R LE pain. Patient also presented increased activity tolerance but patient self-limited her participations to therapy due to her pain at back. Patient would benefit from continued physical therapy to address self-care for her pain, strength, and endurance to improve functional mobility and ADLs tolerance.    Personal Factors and Comorbidities  Age;Comorbidity 3+;Fitness;Time since onset of injury/illness/exacerbation;Past/Current Experience;Other    Comorbidities  underwent extensive vascular surgeries in Jan 2020 to improve blood flow, similar surgery for R leg scheduled 12/07/2018. Has a history of left carotid artery stenosis, but not enough to have surgery, has a history of multiple arterial surgeries, history of R cataract surgery, colon surgery, cholecystectomy, R foot skin graft, ovary surgery, gastric bypass, R foot fusion, abdominal hysterectomy; current smoker, plantar fasciitis, peripheral arterial disease, LVH, diabetes mellitus with neuropathy, irregular heartbeat, hiatal hernia, GERD, stenosis of left heart ventricle, depression, lumbar degenerative disc disease, cardiac murmur, asthma, anxiety, obesity, HTN, and seasonal allergies She has been advised not to lift over 10 pounds by physician.      Examination-Activity Limitations   Bathing;Carry;Lift;Sit;Stand;Locomotion Level;Toileting;Dressing;Squat;Transfers;Stairs;Hygiene/Grooming;Other    Examination-Participation Restrictions  Community Activity;Volunteer;Cleaning;Interpersonal Relationship;Yard Work;Other    Rehab Potential  Fair    Clinical Impairments Affecting Rehab Potential  (+) motivated (-) muliple co morbidities, chronic condition    PT Frequency  2x / week    PT Duration  12 weeks    PT Treatment/Interventions  Moist Heat;Patient/family education;Neuromuscular re-education;Therapeutic exercise;Manual techniques;ADLs/Self Care Home Management;Electrical Stimulation;Gait training;Stair training;Functional mobility training;Therapeutic activities;Balance training;Passive range of motion;Dry needling;Energy conservation;Spinal Manipulations;Joint Manipulations;Cryotherapy    PT Next Visit Plan  continue manual and strengthening activities as tolerated    PT Home Exercise Plan  Medbridge Access Code: [email or web address]    Consulted and Agree with Plan of Care  Patient       Patient will benefit from skilled therapeutic intervention in order to improve the following deficits and impairments:  Decreased strength, Impaired flexibility, Decreased activity tolerance, Impaired perceived functional ability, Pain, Decreased endurance, Difficulty walking, Abnormal gait, Decreased knowledge of use of DME, Decreased skin integrity, Decreased range of motion, Impaired sensation, Improper body mechanics, Obesity, Postural dysfunction, Increased edema, Decreased mobility, Decreased balance, Cardiopulmonary status limiting activity, Decreased coordination  Visit Diagnosis: Muscle weakness (generalized)  Chronic bilateral low back pain, unspecified whether sciatica present  Difficulty in walking, not elsewhere classified  History of falling     Problem List Patient Active Problem List   Diagnosis Date Noted  . Osteopenia 03/03/2019  . Lower extremity edema 07/31/2018  .  Pressure injury of skin 07/24/2018  . Surgical site infection 07/23/2018  . Wound of left leg 07/23/2018  . Atherosclerosis of artery of extremity with rest pain ([place]) 07/08/2018  . Shortness of breath 06/02/2018  . Bruit 06/02/2018  . Coronary artery disease of native artery of native heart with stable angina pectoris ([place]) 06/02/2018  . Atherosclerosis of native arteries of extremity with intermittent claudication ([place]) 05/19/2018  . Peripheral arterial disease ([place]) 05/06/2018  . Personal history of colonic polyps   . Aortic stenosis 12/24/2017  . LVH (left ventricular hypertrophy) 12/24/2017  . Hypertension with heart disease 12/24/2017  . Left atrial dilatation 12/12/2017  . Cardiac murmur 12/12/2017  . Gastrojejunal ulcer 12/04/2017  . Hyperphosphatemia 12/04/2017  . Spinal stenosis of lumbar region 06/18/2017  . Degeneration of lumbar intervertebral disc 06/02/2017  . Assistance needed with transportation 05/26/2017  . Financial difficulties 05/26/2017  . Needs assistance with community resources 05/26/2017  . Moderate recurrent major depression ([place]) 05/26/2017  . Non-healing wound of lower extremity 02/06/2017  . Status post ankle arthrodesis 12/11/2016  . Controlled substance agreement broken 10/11/2016  . Low back pain 09/25/2016  . Osteoarthritis of right subtalar joint 07/11/2016  . Posterior tibial tendinitis of right lower extremity 07/11/2016  . Breast cancer screening 06/18/2016  . Knee pain 04/03/2016  . Hyponatremia 03/01/2016  . High triglycerides 12/24/2015  . Pes planus of both feet 11/16/2015  . Plantar fasciitis of right foot 11/16/2015  . Heel spur 11/16/2015  . Diabetic neuropathy ([place]) 11/16/2015  . Right ankle pain 11/16/2015  . Medication monitoring encounter 11/16/2015  . Chronic pain of multiple joints 08/15/2015  . Abnormal mammogram of right breast 06/19/2015  . Cerumen impaction 03/16/2015  . Hyperlipidemia 12/14/2014  . History of  transfusion of packed RBC 12/14/2014  . Hx of smoking 12/14/2014  . Status post bariatric surgery 12/14/2014  . Morbid obesity ([place]) 12/14/2014  . Chronic radicular lumbar pain 12/14/2014  . History of GI bleed 11/12/2014  . History of small bowel obstruction 09/07/2014    Sherrilyn Rist, SPT 03/30/19, 5:01 PM  Everlean Alstrom. [name], PT, DPT 03/30/19, 5:36 PM   [facility] [address], [place], [address] Phone: [phone]   Fax:  [phone]  Name: Andrea Holden MRN: [id number] Date of Birth: 1945-07-15

## 2019-04-01 ENCOUNTER — Encounter: Payer: Self-pay | Admitting: Physical Therapy

## 2019-04-01 ENCOUNTER — Ambulatory Visit: Payer: Medicare Other | Attending: Family Medicine | Admitting: Physical Therapy

## 2019-04-01 ENCOUNTER — Other Ambulatory Visit: Payer: Self-pay

## 2019-04-01 DIAGNOSIS — M6281 Muscle weakness (generalized): Secondary | ICD-10-CM | POA: Diagnosis present

## 2019-04-01 DIAGNOSIS — M545 Low back pain: Secondary | ICD-10-CM | POA: Diagnosis present

## 2019-04-01 DIAGNOSIS — Z9181 History of falling: Secondary | ICD-10-CM | POA: Diagnosis present

## 2019-04-01 DIAGNOSIS — G8929 Other chronic pain: Secondary | ICD-10-CM | POA: Insufficient documentation

## 2019-04-01 DIAGNOSIS — R262 Difficulty in walking, not elsewhere classified: Secondary | ICD-10-CM | POA: Insufficient documentation

## 2019-04-01 NOTE — Therapy (Signed)
[facility] [address] [place], [address] Phone: [phone]   Fax:  [phone]  Physical Therapy Treatment  Patient Details  Name: Andrea Holden MRN: [id number] Date of Birth: 02-19-1946 Referring Provider (PT): Fredderick Severance, NP    Encounter Date: 04/01/2019  PT End of Session - 04/01/19 1917    Visit Number  30    Number of Visits  36    Date for PT Re-Evaluation  04/20/19    Authorization Type  [facility] Medicare reporting period from 01/26/2019 (new reporting period from 03/23/2019)    Authorization Time Period  Current cert period 8/83/2549 - 04/20/2019 (latest PN: 01/26/2019)    Authorization - Visit Number  3    Authorization - Number of Visits  10    PT Start Time  8264    PT Stop Time  1431    PT Time Calculation (min)  42 min    Activity Tolerance  Patient limited by pain;Patient tolerated treatment well;Patient limited by fatigue    Behavior During Therapy  [facility] for tasks assessed/performed       Past Medical History:  Diagnosis Date  . Allergy    seasonal allergies  . Anemia 2014   needed 5 units of blood d/t passing out, weak  . Anxiety   . Aortic stenosis 12/24/2017   Echo Aug 2018  . Arthritis   . Asthma    allergy induced asthma  . Cardiac murmur 12/12/2017  . Cataract   . Cataract    left  . Complication of anesthesia    arrhythmia following colonoscopy  . Degenerative disc disease, lumbar   . Degenerative disc disease, lumbar   . Depression   . Diabetes mellitus   . Diabetic neuropathy ([place]) 11/16/2015  . Dysrhythmia    stenosis of left ventricle  . GERD (gastroesophageal reflux disease)   . H/O transfusion    patient was given 5 units of blood while at [place], blood type O+  . History of chicken pox   . History of hiatal hernia   . History of measles, mumps, or rubella   . HOH (hard of hearing)    does not use hearing aides yet  . Hyperlipidemia   .  Hypertension   . Irregular heartbeat   . LVH (left ventricular hypertrophy) 12/24/2017   Echo Aug 2018  . Neuropathy   . Opiate use 11/16/2015  . Peripheral arterial disease ([place]) 05/06/2018   At rest, left > right; refer to vasc  . Pes planus of both feet 11/16/2015  . Plantar fasciitis of right foot 11/16/2015  . Wheezing     Past Surgical History:  Procedure Laterality Date  . ABDOMINAL HYSTERECTOMY    . APPLICATION OF WOUND VAC Left 07/23/2018   Procedure: APPLICATION OF WOUND VAC;  Surgeon: Algernon Huxley, MD;  Location: [facility] ORS;  Service: Vascular;  Laterality: Left;  . banck injections    . CATARACT EXTRACTION W/PHACO Right 05/30/2015   Procedure: CATARACT EXTRACTION PHACO AND INTRAOCULAR LENS PLACEMENT (IOC);  Surgeon: Birder Robson, MD;  Location: [facility] ORS;  Service: Ophthalmology;  Laterality: Right;  [place] 00:57 AP% 20.9 CDE 11.99 fluid pack lot #[id number]  . CHOLECYSTECTOMY  1970  . COLON SURGERY  2013   blocked colon  . COLONOSCOPY WITH PROPOFOL N/A 01/20/2018   Procedure: COLONOSCOPY WITH PROPOFOL;  Surgeon: Lucilla Lame, MD;  Location: [facility] ENDOS[facility];  Service: Endoscopy;  Laterality: N/A;  .  ENDARTERECTOMY FEMORAL Left 07/08/2018   Procedure: ENDARTERECTOMY FEMORAL;  Surgeon: Algernon Huxley, MD;  Location: [facility] ORS;  Service: Vascular;  Laterality: Left;  . EYE SURGERY Right    cataract surgery  . FOOT FUSION Right 2018   metal in foot  . GALLBLADDER SURGERY  1970  . GASTRIC BYPASS  2010   lost 178 lbs and regained 40 lbs last few years  . HUMERUS FRACTURE SURGERY Right    metal plate with screws  . internal bleeding  2016   ulcer in past  . LOWER EXTREMITY ANGIOGRAM Left 07/08/2018   Procedure: LOWER EXTREMITY ANGIOGRAM;  Surgeon: Algernon Huxley, MD;  Location: [facility] ORS;  Service: Vascular;  Laterality: Left;  . LOWER EXTREMITY ANGIOGRAPHY Left 05/27/2018   Procedure: LOWER EXTREMITY ANGIOGRAPHY;  Surgeon: Algernon Huxley, MD;  Location: [facility];  Service:  Cardiovascular;  Laterality: Left;  . LOWER EXTREMITY ANGIOGRAPHY Right 12/07/2018   Procedure: LOWER EXTREMITY ANGIOGRAPHY;  Surgeon: Algernon Huxley, MD;  Location: [facility];  Service: Cardiovascular;  Laterality: Right;  . MOUTH SURGERY     root canals and crowns and extractions  . OVARY SURGERY    . SKIN GRAFT Right 2018   RT foot. foot has been rebuilt.  it is full of metal  . TENOTOMY ACHILLES TENDON Right    Percuntaneous. metal in foot  . TONSILLECTOMY    . WOUND DEBRIDEMENT Left 07/23/2018   Procedure: DEBRIDEMENT WOUND;  Surgeon: Algernon Huxley, MD;  Location: [facility] ORS;  Service: Vascular;  Laterality: Left;    There were no vitals filed for this visit.  Subjective Assessment - 04/01/19 1350    Subjective  Patient states her right side back pain 6/10. Patient also on high anxiety today because she is pretty frastrated about lost of her phone.    Pertinent History  Patient is a 73 y.o. female who presents to outpatient physical therapy with a referral for medical diagnosis of degeneration of lumbar intervertebral disc. This patient's chief complaints consist of chronic low back pain and foot pain, weakness, stiffness, leading to the following functional deficits: difficulty with with basic ADLs, IADLs, ambulation. She used to get injections to help control back pain but it has recently worsened due to being unable to get injections during [place] pandemic. The patient has been informed of current processes in place at [facility] to protect patients from Covid-19 exposure including social distancing, schedule modifications, and new cleaning procedures. After discussing their particular risk with a therapist based on the patient's personal risk factors, the patient has decided to proceed with in-person therapy. Pt is scheduled for R LE angiography on 12/07/2018. She has recently started smoking again with the stress of COVID19 pandemic and feels strongly she needs to stop. Surgeon  has stated in the past he will not complete surgery if she is smoking.  Relevant past medical history and comorbidities include underwent extensive vascular surgeries in Jan 2020 to improve blood flow, similar surgery for R leg scheduled 12/07/2018. Has a history of left carotid artery stenosis, but not enough to have surgery, has a history of multiple arterial surgeries, history of R cataract surgery, colon surgery, cholecystectomy, R foot skin graft, ovary surgery, gastric bypass, R foot fusion, abdominal hysterectomy; current smoker, plantar fasciitis, peripheral arterial disease, LVH, diabetes mellitus with neuropathy, irregular heartbeat, hiatal hernia, GERD, stenosis of left heart ventricle, depression, lumbar degenerative disc disease, cardiac murmur, asthma, anxiety, obesity, HTN, and seasonal allergies  She has been advised not to lift over 10 pounds by physician    Limitations  Sitting;Standing;Walking;House hold activities;Lifting    How long can you sit comfortably?  > 1 hour in almost any chair    How long can you stand comfortably?  4-5 minutes    How long can you walk comfortably?  2 minutes, depends on what she has with her. Able to walk at fair with rollator. To mailbox 120 feet causes shaking in R leg.      Diagnostic tests  MRI lumbar spine report 06/14/2017: "IMPRESSION: 1. Stable degenerative of lumbar spondylosis and scoliosis with multilevel disc disease and facet disease. 2. Stable right foraminal stenosis at L5-S1 due to right-sided disc    Currently in Pain?  Yes    Pain Score  6     Pain Location  Back    Pain Orientation  Posterior;Lower    Pain Onset  More than a month ago       TREATMENT Therapeutic exercise:to centralize symptoms and improve ROM, strength, muscular endurance, and activity tolerance required for successful completion of functional activities.  NuStep 8mn level 1 (with BUEs and BLEs); 3(4 minutes without BUEs); 175m break; 3(3 minutes without BUEs).  Seatsetting 8/handle setting7. For improved extremity mobility, muscular endurance, and activity tolerance; and to induce the analgesic effect of aerobic exercise, stimulate improved joint nutrition, and prepare body structures and systems for following interventions. Instructed to attempt to keepSPM above60during subjective exam.Average SPM =50, 0.2886ms. UE strengthening without back rest to improve lumbar extension and core stabilization  Seated 2lb(2set)/3lb(1set) dumbbell shoulder flexion. 8 reps for 2lb and 10 reps for 2lb  Seated 2lb(2set)/3lb(1set) dumbbell shoulder horizontal abduction. 10 reps each set   Seated 2lb(3set) dumbbell shoulder abduction. 10 reps each set  Seated 2lb shoulder flexion to the end of pain free range with x10 shoulder elevation and depression.  Patient tolerated the exercise well with good shoulder muscle activation and patient states her shoulder is always pretty weak and this is good for her to keep her moving around at house. Extensive resting breaks offered to this patient due to patient's low activity tolerance and patient's request.  - Ambulation 70 feet with CGA to assess postural stability during dynamic balance through gait.  - Ambulation to stairs x 2. Patient request cuing to follow instructions. Patient able to proceed stairs alternatively although using BUE support due to LE weakness. No increase of her back pain noted.      Patient tolerated the session well and no increase of her back pain at the end. Patient also demonstrated good progression towards her shoulder strengthening exercise. Patient able to tolerated higher weights as well as increased pain free ROM at bilateral shoulder. Patient also presented good thoracic extension and increased muscle endurance to sitting on her chair while exercising. Patient, however, still presented weakness at her BLEs as well as significant general weakness which limited her participation at ADLs and  IADLs. Patient would benefit from continued physical therapy to address self-care for her pain, strength, and endurance to improve functional mobility and ADLs tolerance.        PT Education - 04/01/19 1916    Education provided  Yes    Education Details  Exercise purpose/form. Self management techniques    Person(s) Educated  Patient    Methods  Explanation;Demonstration;Tactile cues;Verbal cues    Comprehension  Verbalized understanding;Returned demonstration;Verbal cues required;Tactile cues required       PT Short Term Goals -  03/23/19 1413      PT SHORT TERM GOAL #1   Title  Be independent with initial home exercise program for self-management of symptoms.    Baseline  Initial HEP provided at [facility] (11/17/2018); was completing until vascular procedure, re-instated 12/15/2018 (12/15/2018); currently participating (01/26/2019); Partially doing HEP due the patient's recent health condition/weakness(03/23/2019);    Time  2    Period  Weeks    Status  Partially Met    Target Date  04/06/19        PT Long Term Goals - 03/23/19 1415      PT LONG TERM GOAL #1   Title  Be independent with a long-term home exercise program for self-management of symptoms.     Baseline  Initial HEP provided at [facility] (11/17/2018); was doing better before vascular surgery, is working at returning to [facility] since procedure (12/15/2018); currently participating but not assigned final long term HEP yet (01/26/2019); currently participating but not assigned final long term HEP yet (03/23/2019);    Time  12    Period  Weeks    Status  Partially Met    Target Date  04/20/19      PT LONG TERM GOAL #2   Title  Patient will improve 6MWT to equal or greater than 1000 feet with LRAD to demonstrate improved activity tolerance for community ambulation.     Baseline  6 Minute Walk Test: 200 feet with SPC and supervision. Stopped at 1:43 due to fatigue and leg pain. (11/17/2018); 457 feet SPC, on seated and one standing break  (12/15/2018); 547 with SPC, 2 seated and one standing rest break (01/26/2019); 468 feet with SPC, 2 seated breaks(03/23/2019);    Time  12    Period  Weeks    Status  Partially Met    Target Date  04/20/19      PT LONG TERM GOAL #3   Title  Patient will demonstrate improved 5 Times Sit to Stand test to 11 seconds from chair height with no UE support to demonstrate improvement in LE power and functional strength for daily activities.    Baseline  5 Time Sit to Stand: 27 seconds from chair height no UE support (11/17/2018); 18 seconds, no UE support from chair-high plinth (12/15/2018); 16 seconds with no UE support from chair (01/26/2019); 17.45s with lack of knee extension in standing(03/23/2019);    Time  12    Period  Weeks    Status  Partially Met    Target Date  04/20/19      PT LONG TERM GOAL #4   Title  Patient will ambulate faster than 1.27ms on the 10 Meter Walk Test to improve ability to cross street safey for community participation.     Baseline  testing deferred to next session (11/17/2018); 1.37 m/sec with SPC (11/24/2018); 1.3 m/sec with SPC (12/15/2018; 01/26/2019); 0.458m (03/23/2019);    Time  12    Period  Weeks    Status  Achieved    Target Date  04/20/19      PT LONG TERM GOAL #5   Title  Patient will score equal or greater than  25/30 on the Functional Gait Assessment to demonstrate low fall risk to improve her ability to participate safely in community activities.     Baseline  testing deferred to next session (11/17/2018); 11/30 high fall risk (11/24/2018); unable to measure due to time limitation (12/15/2018); 13/30 high fall risk (12/17/2018); 14/30 high fall risk (01/26/2019); 14/30 high fall risk(03/23/2019);  Time  12    Period  Weeks    Status  Partially Met    Target Date  04/20/19      PT LONG TERM GOAL #6   Title  Reduce pain to equal or less than 4/10 with functional activities to allow patient to complete valued functional tasks such as caring for dogs, grocery  shopping, walking with less difficulty.(03/23/2019);    Baseline  8/10 (01/26/2019); 6/10 (03/23/2019)    Time  12    Period  Weeks    Status  New    Target Date  04/20/19            Plan - 04/01/19 1918    Clinical Impression Statement  Patient tolerated the session well and no increase of her back pain at the end. Patient also demonstrated good progression towards her shoulder strengthening exercise. Patient able to tolerated higher weights as well as increased pain free ROM at bilateral shoulder. Patient also presented good thoracic extension and increased muscle endurance to sitting on her chair while exercising. Patient, however, still presented weakness at her BLEs as well as significant general weakness which limited her participation at ADLs and IADLs. Patient would benefit from continued physical therapy to address self-care for her pain, strength, and endurance to improve functional mobility and ADLs tolerance.    Personal Factors and Comorbidities  Age;Comorbidity 3+;Fitness;Time since onset of injury/illness/exacerbation;Past/Current Experience;Other    Comorbidities  underwent extensive vascular surgeries in Jan 2020 to improve blood flow, similar surgery for R leg scheduled 12/07/2018. Has a history of left carotid artery stenosis, but not enough to have surgery, has a history of multiple arterial surgeries, history of R cataract surgery, colon surgery, cholecystectomy, R foot skin graft, ovary surgery, gastric bypass, R foot fusion, abdominal hysterectomy; current smoker, plantar fasciitis, peripheral arterial disease, LVH, diabetes mellitus with neuropathy, irregular heartbeat, hiatal hernia, GERD, stenosis of left heart ventricle, depression, lumbar degenerative disc disease, cardiac murmur, asthma, anxiety, obesity, HTN, and seasonal allergies She has been advised not to lift over 10 pounds by physician.      Examination-Activity Limitations  Bathing;Carry;Lift;Sit;Stand;Locomotion  Level;Toileting;Dressing;Squat;Transfers;Stairs;Hygiene/Grooming;Other    Examination-Participation Restrictions  Community Activity;Volunteer;Cleaning;Interpersonal Relationship;Yard Work;Other    Rehab Potential  Fair    Clinical Impairments Affecting Rehab Potential  (+) motivated (-) muliple co morbidities, chronic condition    PT Frequency  2x / week    PT Duration  12 weeks    PT Treatment/Interventions  Moist Heat;Patient/family education;Neuromuscular re-education;Therapeutic exercise;Manual techniques;ADLs/Self Care Home Management;Electrical Stimulation;Gait training;Stair training;Functional mobility training;Therapeutic activities;Balance training;Passive range of motion;Dry needling;Energy conservation;Spinal Manipulations;Joint Manipulations;Cryotherapy    PT Next Visit Plan  continue manual and strengthening activities as tolerated    PT Home Exercise Plan  Medbridge Access Code: [email or web address]    Consulted and Agree with Plan of Care  Patient       Patient will benefit from skilled therapeutic intervention in order to improve the following deficits and impairments:  Decreased strength, Impaired flexibility, Decreased activity tolerance, Impaired perceived functional ability, Pain, Decreased endurance, Difficulty walking, Abnormal gait, Decreased knowledge of use of DME, Decreased skin integrity, Decreased range of motion, Impaired sensation, Improper body mechanics, Obesity, Postural dysfunction, Increased edema, Decreased mobility, Decreased balance, Cardiopulmonary status limiting activity, Decreased coordination  Visit Diagnosis: Muscle weakness (generalized)  Chronic bilateral low back pain, unspecified whether sciatica present  Difficulty in walking, not elsewhere classified  History of falling     Problem List Patient Active Problem List   Diagnosis Date  Noted  . Osteopenia 03/03/2019  . Lower extremity edema 07/31/2018  . Pressure injury of skin 07/24/2018  .  Surgical site infection 07/23/2018  . Wound of left leg 07/23/2018  . Atherosclerosis of artery of extremity with rest pain ([place]) 07/08/2018  . Shortness of breath 06/02/2018  . Bruit 06/02/2018  . Coronary artery disease of native artery of native heart with stable angina pectoris ([place]) 06/02/2018  . Atherosclerosis of native arteries of extremity with intermittent claudication ([place]) 05/19/2018  . Peripheral arterial disease ([place]) 05/06/2018  . Personal history of colonic polyps   . Aortic stenosis 12/24/2017  . LVH (left ventricular hypertrophy) 12/24/2017  . Hypertension with heart disease 12/24/2017  . Left atrial dilatation 12/12/2017  . Cardiac murmur 12/12/2017  . Gastrojejunal ulcer 12/04/2017  . Hyperphosphatemia 12/04/2017  . Spinal stenosis of lumbar region 06/18/2017  . Degeneration of lumbar intervertebral disc 06/02/2017  . Assistance needed with transportation 05/26/2017  . Financial difficulties 05/26/2017  . Needs assistance with community resources 05/26/2017  . Moderate recurrent major depression ([place]) 05/26/2017  . Non-healing wound of lower extremity 02/06/2017  . Status post ankle arthrodesis 12/11/2016  . Controlled substance agreement broken 10/11/2016  . Low back pain 09/25/2016  . Osteoarthritis of right subtalar joint 07/11/2016  . Posterior tibial tendinitis of right lower extremity 07/11/2016  . Breast cancer screening 06/18/2016  . Knee pain 04/03/2016  . Hyponatremia 03/01/2016  . High triglycerides 12/24/2015  . Pes planus of both feet 11/16/2015  . Plantar fasciitis of right foot 11/16/2015  . Heel spur 11/16/2015  . Diabetic neuropathy ([place]) 11/16/2015  . Right ankle pain 11/16/2015  . Medication monitoring encounter 11/16/2015  . Chronic pain of multiple joints 08/15/2015  . Abnormal mammogram of right breast 06/19/2015  . Cerumen impaction 03/16/2015  . Hyperlipidemia 12/14/2014  . History of transfusion of packed RBC 12/14/2014  . Hx of  smoking 12/14/2014  . Status post bariatric surgery 12/14/2014  . Morbid obesity ([place]) 12/14/2014  . Chronic radicular lumbar pain 12/14/2014  . History of GI bleed 11/12/2014  . History of small bowel obstruction 09/07/2014    .Sherrilyn Rist, SPT 04/01/19, 7:31 PM  Everlean Alstrom. [name], PT, DPT 04/01/19, 7:36 PM   [facility] [address], [place], [address] Phone: [phone]   Fax:  [phone]  Name: Andrea Holden MRN: [id number] Date of Birth: [birth year]

## 2019-04-06 ENCOUNTER — Encounter: Payer: Self-pay | Admitting: Physical Therapy

## 2019-04-06 ENCOUNTER — Ambulatory Visit: Payer: Medicare Other | Admitting: Physical Therapy

## 2019-04-06 ENCOUNTER — Other Ambulatory Visit: Payer: Self-pay

## 2019-04-06 DIAGNOSIS — G8929 Other chronic pain: Secondary | ICD-10-CM

## 2019-04-06 DIAGNOSIS — R262 Difficulty in walking, not elsewhere classified: Secondary | ICD-10-CM

## 2019-04-06 DIAGNOSIS — Z9181 History of falling: Secondary | ICD-10-CM

## 2019-04-06 DIAGNOSIS — M6281 Muscle weakness (generalized): Secondary | ICD-10-CM | POA: Diagnosis not present

## 2019-04-06 NOTE — Therapy (Signed)
[facility] [address] [place], [address] Phone: [phone]   Fax:  [phone]  Physical Therapy Treatment  Patient Details  Name: Andrea Holden MRN: [id number] Date of Birth: 1946-01-29 Referring Provider (PT): Fredderick Severance, NP    Encounter Date: 04/06/2019  PT End of Session - 04/06/19 1312    Visit Number  31    Number of Visits  36    Date for PT Re-Evaluation  04/20/19    Authorization Type  [facility] Medicare reporting period from 01/26/2019 (new reporting period from 03/23/2019)    Authorization Time Period  Current cert period 07/03/129 - 04/20/2019 (latest PN: 01/26/2019)    Authorization - Visit Number  4    Authorization - Number of Visits  10    PT Start Time  1304    PT Stop Time  1345    PT Time Calculation (min)  41 min    Equipment Utilized During Treatment  Gait belt    Activity Tolerance  Patient limited by pain;Patient tolerated treatment well;Patient limited by fatigue    Behavior During Therapy  [facility] for tasks assessed/performed       Past Medical History:  Diagnosis Date  . Allergy    seasonal allergies  . Anemia 2014   needed 5 units of blood d/t passing out, weak  . Anxiety   . Aortic stenosis 12/24/2017   Echo Aug 2018  . Arthritis   . Asthma    allergy induced asthma  . Cardiac murmur 12/12/2017  . Cataract   . Cataract    left  . Complication of anesthesia    arrhythmia following colonoscopy  . Degenerative disc disease, lumbar   . Degenerative disc disease, lumbar   . Depression   . Diabetes mellitus   . Diabetic neuropathy ([place]) 11/16/2015  . Dysrhythmia    stenosis of left ventricle  . GERD (gastroesophageal reflux disease)   . H/O transfusion    patient was given 5 units of blood while at [place], blood type O+  . History of chicken pox   . History of hiatal hernia   . History of measles, mumps, or rubella   . HOH (hard of hearing)    does not use  hearing aides yet  . Hyperlipidemia   . Hypertension   . Irregular heartbeat   . LVH (left ventricular hypertrophy) 12/24/2017   Echo Aug 2018  . Neuropathy   . Opiate use 11/16/2015  . Peripheral arterial disease ([place]) 05/06/2018   At rest, left > right; refer to vasc  . Pes planus of both feet 11/16/2015  . Plantar fasciitis of right foot 11/16/2015  . Wheezing     Past Surgical History:  Procedure Laterality Date  . ABDOMINAL HYSTERECTOMY    . APPLICATION OF WOUND VAC Left 07/23/2018   Procedure: APPLICATION OF WOUND VAC;  Surgeon: Algernon Huxley, MD;  Location: [facility];  Service: Vascular;  Laterality: Left;  . banck injections    . CATARACT EXTRACTION W/PHACO Right 05/30/2015   Procedure: CATARACT EXTRACTION PHACO AND INTRAOCULAR LENS PLACEMENT (IOC);  Surgeon: Birder Robson, MD;  Location: [facility];  Service: Ophthalmology;  Laterality: Right;  [place] 00:57 AP% 20.9 CDE 11.99 fluid pack lot #[id number]  . CHOLECYSTECTOMY  1970  . COLON SURGERY  2013   blocked colon  . COLONOSCOPY WITH PROPOFOL N/A 01/20/2018   Procedure: COLONOSCOPY WITH PROPOFOL;  Surgeon: Lucilla Lame, MD;  Location:  [facility];  Service: Endoscopy;  Laterality: N/A;  . ENDARTERECTOMY FEMORAL Left 07/08/2018   Procedure: ENDARTERECTOMY FEMORAL;  Surgeon: Algernon Huxley, MD;  Location: [facility];  Service: Vascular;  Laterality: Left;  . EYE SURGERY Right    cataract surgery  . FOOT FUSION Right 2018   metal in foot  . GALLBLADDER SURGERY  1970  . GASTRIC BYPASS  2010   lost 178 lbs and regained 40 lbs last few years  . HUMERUS FRACTURE SURGERY Right    metal plate with screws  . internal bleeding  2016   ulcer in past  . LOWER EXTREMITY ANGIOGRAM Left 07/08/2018   Procedure: LOWER EXTREMITY ANGIOGRAM;  Surgeon: Algernon Huxley, MD;  Location: [facility];  Service: Vascular;  Laterality: Left;  . LOWER EXTREMITY ANGIOGRAPHY Left 05/27/2018   Procedure: LOWER EXTREMITY ANGIOGRAPHY;  Surgeon: Algernon Huxley, MD;   Location: [facility];  Service: Cardiovascular;  Laterality: Left;  . LOWER EXTREMITY ANGIOGRAPHY Right 12/07/2018   Procedure: LOWER EXTREMITY ANGIOGRAPHY;  Surgeon: Algernon Huxley, MD;  Location: [facility];  Service: Cardiovascular;  Laterality: Right;  . MOUTH SURGERY     root canals and crowns and extractions  . OVARY SURGERY    . SKIN GRAFT Right 2018   RT foot. foot has been rebuilt.  it is full of metal  . TENOTOMY ACHILLES TENDON Right    Percuntaneous. metal in foot  . TONSILLECTOMY    . WOUND DEBRIDEMENT Left 07/23/2018   Procedure: DEBRIDEMENT WOUND;  Surgeon: Algernon Huxley, MD;  Location: [facility];  Service: Vascular;  Laterality: Left;    There were no vitals filed for this visit.  Subjective Assessment - 04/06/19 1306    Subjective  Patient reports back pain has been better and her upper thoracic pain more concerned her. It is 5/10 for her upper back today. No recent falls. She has been doing more walking recently. She has been feeling tired because of increased level of activity. The heating pad helps her some of her back pain.    Pertinent History  Patient is a 73 y.o. female who presents to outpatient physical therapy with a referral for medical diagnosis of degeneration of lumbar intervertebral disc. This patient's chief complaints consist of chronic low back pain and foot pain, weakness, stiffness, leading to the following functional deficits: difficulty with with basic ADLs, IADLs, ambulation. She used to get injections to help control back pain but it has recently worsened due to being unable to get injections during [place] pandemic. The patient has been informed of current processes in place at [facility]patient [facility] to protect patients from Covid-19 exposure including social distancing, schedule modifications, and new cleaning procedures. After discussing their particular risk with a therapist based on the patient's personal risk factors, the patient has decided to  proceed with in-person therapy. Pt is scheduled for R LE angiography on 12/07/2018. She has recently started smoking again with the stress of COVID19 pandemic and feels strongly she needs to stop. Surgeon has stated in the past he will not complete surgery if she is smoking.  Relevant past medical history and comorbidities include underwent extensive vascular surgeries in Jan 2020 to improve blood flow, similar surgery for R leg scheduled 12/07/2018. Has a history of left carotid artery stenosis, but not enough to have surgery, has a history of multiple arterial surgeries, history of R cataract surgery, colon surgery, cholecystectomy, R foot skin graft, ovary surgery, gastric bypass, R  foot fusion, abdominal hysterectomy; current smoker, plantar fasciitis, peripheral arterial disease, LVH, diabetes mellitus with neuropathy, irregular heartbeat, hiatal hernia, GERD, stenosis of left heart ventricle, depression, lumbar degenerative disc disease, cardiac murmur, asthma, anxiety, obesity, HTN, and seasonal allergies She has been advised not to lift over 10 pounds by physician    Limitations  Sitting;Standing;Walking;House hold activities;Lifting    How long can you sit comfortably?  > 1 hour in almost any chair    How long can you stand comfortably?  4-5 minutes    How long can you walk comfortably?  2 minutes, depends on what she has with her. Able to walk at fair with rollator. To mailbox 120 feet causes shaking in R leg.      Diagnostic tests  MRI lumbar spine report 06/14/2017: "IMPRESSION: 1. Stable degenerative of lumbar spondylosis and scoliosis with multilevel disc disease and facet disease. 2. Stable right foraminal stenosis at L5-S1 due to right-sided disc    Currently in Pain?  Yes    Pain Score  4     Pain Location  Back    Pain Orientation  Right;Left;Upper    Pain Onset  More than a month ago         TREATMENT Therapeutic exercise:to centralize symptoms and improve ROM, strength, muscular  endurance, and activity tolerance required for successful completion of functional activities.  NuStep 28mn level 3 ( 5 mins with BUEs and BLEs); 3(519m without BUEs)Seatsetting 8/handle setting7. For improved extremity mobility, muscular endurance, and activity tolerance; and to induce the analgesic effect of aerobic exercise, stimulate improved joint nutrition, and prepare body structures and systems for following interventions. Instructed to attempt to keepSPM above60during subjective exam.Average SPM =56, 0.3130ms. UE strengthening without back rest to improve lumbar extension and core stabilization  Seated 2lb(2set)/3lb(1set)dumbbell shoulder flexion. 10reps each.   Seated 2lb(2set)/3lb(1set)dumbbell shoulder horizontal abduction. 10repseach set  Seated 2lb(3set) dumbbell shoulder abduction. 10 reps each set  Seated 2lb shoulder flexion to the end of pain free range with x10 shoulder elevation and depression.  Patient states she felt better/stronger with her shoulder at home and she likes these exercises    Therapeutic activities: for functional strengthening and improved functional activity tolerance.  Ambulation x 80 feet with high knee marching x 2 reps to increase single leg standing time and dynamic balance.   Ambulation x 80 feet with 4lb on L UE to improve gluteal activation during ambulation x 3 reps.   Patient tolerated the sessionwell and no increase of her upper back pain at the end. Patient demonstrated tiredness and requested extensive resting breaks between exercises. However, patient able to complete the session and demonstrated good progression towards increased weight. Patient also presented good motivation towards her future rehab sessions and actively seeking help for her LEs weakness. Session will focus on strengthening, endurance, and increase of functional mobility tolerance. Patient would benefit from continued physical therapy to address  self-care for her pain, strength, and endurance to improve functional mobility and ADLs tolerance.    PT Education - 04/06/19 1312    Education provided  Yes    Education Details  Exercise purpose/form. Self management techniques    Person(s) Educated  Patient    Methods  Explanation;Demonstration;Tactile cues;Verbal cues    Comprehension  Verbalized understanding;Returned demonstration;Verbal cues required;Tactile cues required       PT Short Term Goals - 03/23/19 1413      PT SHORT TERM GOAL #1   Title  Be independent with initial home  exercise program for self-management of symptoms.    Baseline  Initial HEP provided at [facility] (11/17/2018); was completing until vascular procedure, re-instated 12/15/2018 (12/15/2018); currently participating (01/26/2019); Partially doing HEP due the patient's recent health condition/weakness(03/23/2019);    Time  2    Period  Weeks    Status  Partially Met    Target Date  04/06/19        PT Long Term Goals - 03/23/19 1415      PT LONG TERM GOAL #1   Title  Be independent with a long-term home exercise program for self-management of symptoms.     Baseline  Initial HEP provided at [facility] (11/17/2018); was doing better before vascular surgery, is working at returning to [facility] since procedure (12/15/2018); currently participating but not assigned final long term HEP yet (01/26/2019); currently participating but not assigned final long term HEP yet (03/23/2019);    Time  12    Period  Weeks    Status  Partially Met    Target Date  04/20/19      PT LONG TERM GOAL #2   Title  Patient will improve 6MWT to equal or greater than 1000 feet with LRAD to demonstrate improved activity tolerance for community ambulation.     Baseline  6 Minute Walk Test: 200 feet with SPC and supervision. Stopped at 1:43 due to fatigue and leg pain. (11/17/2018); 457 feet SPC, on seated and one standing break (12/15/2018); 547 with SPC, 2 seated and one standing rest break (01/26/2019); 468  feet with SPC, 2 seated breaks(03/23/2019);    Time  12    Period  Weeks    Status  Partially Met    Target Date  04/20/19      PT LONG TERM GOAL #3   Title  Patient will demonstrate improved 5 Times Sit to Stand test to 11 seconds from chair height with no UE support to demonstrate improvement in LE power and functional strength for daily activities.    Baseline  5 Time Sit to Stand: 27 seconds from chair height no UE support (11/17/2018); 18 seconds, no UE support from chair-high plinth (12/15/2018); 16 seconds with no UE support from chair (01/26/2019); 17.45s with lack of knee extension in standing(03/23/2019);    Time  12    Period  Weeks    Status  Partially Met    Target Date  04/20/19      PT LONG TERM GOAL #4   Title  Patient will ambulate faster than 1.74ms on the 10 Meter Walk Test to improve ability to cross street safey for community participation.     Baseline  testing deferred to next session (11/17/2018); 1.37 m/sec with SPC (11/24/2018); 1.3 m/sec with SPC (12/15/2018; 01/26/2019); 0.417m (03/23/2019);    Time  12    Period  Weeks    Status  Achieved    Target Date  04/20/19      PT LONG TERM GOAL #5   Title  Patient will score equal or greater than  25/30 on the Functional Gait Assessment to demonstrate low fall risk to improve her ability to participate safely in community activities.     Baseline  testing deferred to next session (11/17/2018); 11/30 high fall risk (11/24/2018); unable to measure due to time limitation (12/15/2018); 13/30 high fall risk (12/17/2018); 14/30 high fall risk (01/26/2019); 14/30 high fall risk(03/23/2019);    Time  12    Period  Weeks    Status  Partially Met  Target Date  04/20/19      PT LONG TERM GOAL #6   Title  Reduce pain to equal or less than 4/10 with functional activities to allow patient to complete valued functional tasks such as caring for dogs, grocery shopping, walking with less difficulty.(03/23/2019);    Baseline  8/10  (01/26/2019); 6/10 (03/23/2019)    Time  12    Period  Weeks    Status  New    Target Date  04/20/19            Plan - 04/06/19 1313    Clinical Impression Statement  Patient tolerated the session well and no increase of her upper back pain at the end. Patient demonstrated tiredness and requested extensive resting breaks between exercises. However, patient able to complete the session and demonstrated good progression towards increased weight. Patient also presented good motivation towards her future rehab sessions and actively seeking help for her LEs weakness. Session will focus on strengthening, endurance, and increase of functional mobility tolerance. Patient would benefit from continued physical therapy to address self-care for her pain, strength, and endurance to improve functional mobility and ADLs tolerance.    Personal Factors and Comorbidities  Age;Comorbidity 3+;Fitness;Time since onset of injury/illness/exacerbation;Past/Current Experience;Other    Comorbidities  underwent extensive vascular surgeries in Jan 2020 to improve blood flow, similar surgery for R leg scheduled 12/07/2018. Has a history of left carotid artery stenosis, but not enough to have surgery, has a history of multiple arterial surgeries, history of R cataract surgery, colon surgery, cholecystectomy, R foot skin graft, ovary surgery, gastric bypass, R foot fusion, abdominal hysterectomy; current smoker, plantar fasciitis, peripheral arterial disease, LVH, diabetes mellitus with neuropathy, irregular heartbeat, hiatal hernia, GERD, stenosis of left heart ventricle, depression, lumbar degenerative disc disease, cardiac murmur, asthma, anxiety, obesity, HTN, and seasonal allergies She has been advised not to lift over 10 pounds by physician.      Examination-Activity Limitations  Bathing;Carry;Lift;Sit;Stand;Locomotion Level;Toileting;Dressing;Squat;Transfers;Stairs;Hygiene/Grooming;Other    Examination-Participation  Restrictions  Community Activity;Volunteer;Cleaning;Interpersonal Relationship;Yard Work;Other    Rehab Potential  Fair    Clinical Impairments Affecting Rehab Potential  (+) motivated (-) muliple co morbidities, chronic condition    PT Frequency  2x / week    PT Duration  12 weeks    PT Treatment/Interventions  Moist Heat;Patient/family education;Neuromuscular re-education;Therapeutic exercise;Manual techniques;ADLs/Self Care Home Management;Electrical Stimulation;Gait training;Stair training;Functional mobility training;Therapeutic activities;Balance training;Passive range of motion;Dry needling;Energy conservation;Spinal Manipulations;Joint Manipulations;Cryotherapy    PT Next Visit Plan  continue manual and strengthening activities as tolerated    PT Home Exercise Plan  Medbridge Access Code: [email or web address]    Consulted and Agree with Plan of Care  Patient       Patient will benefit from skilled therapeutic intervention in order to improve the following deficits and impairments:  Decreased strength, Impaired flexibility, Decreased activity tolerance, Impaired perceived functional ability, Pain, Decreased endurance, Difficulty walking, Abnormal gait, Decreased knowledge of use of DME, Decreased skin integrity, Decreased range of motion, Impaired sensation, Improper body mechanics, Obesity, Postural dysfunction, Increased edema, Decreased mobility, Decreased balance, Cardiopulmonary status limiting activity, Decreased coordination  Visit Diagnosis: Muscle weakness (generalized)  Chronic bilateral low back pain, unspecified whether sciatica present  Difficulty in walking, not elsewhere classified  History of falling     Problem List Patient Active Problem List   Diagnosis Date Noted  . Osteopenia 03/03/2019  . Lower extremity edema 07/31/2018  . Pressure injury of skin 07/24/2018  . Surgical site infection 07/23/2018  . Wound of left  leg 07/23/2018  . Atherosclerosis of artery of  extremity with rest pain ([place]) 07/08/2018  . Shortness of breath 06/02/2018  . Bruit 06/02/2018  . Coronary artery disease of native artery of native heart with stable angina pectoris ([place]) 06/02/2018  . Atherosclerosis of native arteries of extremity with intermittent claudication ([place]) 05/19/2018  . Peripheral arterial disease ([place]) 05/06/2018  . Personal history of colonic polyps   . Aortic stenosis 12/24/2017  . LVH (left ventricular hypertrophy) 12/24/2017  . Hypertension with heart disease 12/24/2017  . Left atrial dilatation 12/12/2017  . Cardiac murmur 12/12/2017  . Gastrojejunal ulcer 12/04/2017  . Hyperphosphatemia 12/04/2017  . Spinal stenosis of lumbar region 06/18/2017  . Degeneration of lumbar intervertebral disc 06/02/2017  . Assistance needed with transportation 05/26/2017  . Financial difficulties 05/26/2017  . Needs assistance with community resources 05/26/2017  . Moderate recurrent major depression ([place]) 05/26/2017  . Non-healing wound of lower extremity 02/06/2017  . Status post ankle arthrodesis 12/11/2016  . Controlled substance agreement broken 10/11/2016  . Low back pain 09/25/2016  . Osteoarthritis of right subtalar joint 07/11/2016  . Posterior tibial tendinitis of right lower extremity 07/11/2016  . Breast cancer screening 06/18/2016  . Knee pain 04/03/2016  . Hyponatremia 03/01/2016  . High triglycerides 12/24/2015  . Pes planus of both feet 11/16/2015  . Plantar fasciitis of right foot 11/16/2015  . Heel spur 11/16/2015  . Diabetic neuropathy ([place]) 11/16/2015  . Right ankle pain 11/16/2015  . Medication monitoring encounter 11/16/2015  . Chronic pain of multiple joints 08/15/2015  . Abnormal mammogram of right breast 06/19/2015  . Cerumen impaction 03/16/2015  . Hyperlipidemia 12/14/2014  . History of transfusion of packed RBC 12/14/2014  . Hx of smoking 12/14/2014  . Status post bariatric surgery 12/14/2014  . Morbid obesity ([place]) 12/14/2014   . Chronic radicular lumbar pain 12/14/2014  . History of GI bleed 11/12/2014  . History of small bowel obstruction 09/07/2014    .Sherrilyn Rist, SPT 04/06/19, 2:44 PM  Everlean Alstrom. [name], PT, DPT 04/06/19, 2:44 PM   [facility] [address] [place], [address] Phone: [phone]   Fax:  [phone]  Name: Andrea Holden MRN: [id number] Date of Birth: September 16, 1945

## 2019-04-07 ENCOUNTER — Encounter (INDEPENDENT_AMBULATORY_CARE_PROVIDER_SITE_OTHER): Payer: Self-pay | Admitting: Nurse Practitioner

## 2019-04-07 ENCOUNTER — Other Ambulatory Visit: Payer: Self-pay

## 2019-04-07 ENCOUNTER — Ambulatory Visit (INDEPENDENT_AMBULATORY_CARE_PROVIDER_SITE_OTHER): Payer: Medicare Other

## 2019-04-07 ENCOUNTER — Ambulatory Visit (INDEPENDENT_AMBULATORY_CARE_PROVIDER_SITE_OTHER): Payer: Medicare Other | Admitting: Nurse Practitioner

## 2019-04-07 VITALS — BP 194/70 | HR 75 | Resp 12 | Ht 61.0 in | Wt 202.0 lb

## 2019-04-07 DIAGNOSIS — Z87891 Personal history of nicotine dependence: Secondary | ICD-10-CM | POA: Diagnosis not present

## 2019-04-07 DIAGNOSIS — I70229 Atherosclerosis of native arteries of extremities with rest pain, unspecified extremity: Secondary | ICD-10-CM

## 2019-04-07 DIAGNOSIS — E782 Mixed hyperlipidemia: Secondary | ICD-10-CM

## 2019-04-07 DIAGNOSIS — M5136 Other intervertebral disc degeneration, lumbar region: Secondary | ICD-10-CM

## 2019-04-07 DIAGNOSIS — I70213 Atherosclerosis of native arteries of extremities with intermittent claudication, bilateral legs: Secondary | ICD-10-CM | POA: Diagnosis not present

## 2019-04-08 ENCOUNTER — Ambulatory Visit: Payer: Medicare Other | Admitting: Physical Therapy

## 2019-04-08 ENCOUNTER — Encounter: Payer: Self-pay | Admitting: Physical Therapy

## 2019-04-08 DIAGNOSIS — Z9181 History of falling: Secondary | ICD-10-CM

## 2019-04-08 DIAGNOSIS — M6281 Muscle weakness (generalized): Secondary | ICD-10-CM

## 2019-04-08 DIAGNOSIS — R262 Difficulty in walking, not elsewhere classified: Secondary | ICD-10-CM

## 2019-04-08 DIAGNOSIS — G8929 Other chronic pain: Secondary | ICD-10-CM

## 2019-04-08 NOTE — Therapy (Signed)
[facility] [address], [place], [address] Phone: [phone]   Fax:  [phone]  Physical Therapy Treatment  Patient Details  Name: Andrea Holden MRN: [id number] Date of Birth: 10-Dec-1945 Referring Provider (PT): Fredderick Severance, NP    Encounter Date: 04/08/2019  PT End of Session - 04/08/19 1043    Visit Number  32    Number of Visits  36    Date for PT Re-Evaluation  04/20/19    Authorization Type  [facility] Medicare reporting period from 01/26/2019 (new reporting period from 03/23/2019)    Authorization Time Period  Current cert period 6/78/9381 - 04/20/2019 (latest PN: 01/26/2019)    Authorization - Visit Number  5    Authorization - Number of Visits  10    PT Start Time  1035    PT Stop Time  1118    PT Time Calculation (min)  43 min    Equipment Utilized During Treatment  Gait belt    Activity Tolerance  Patient limited by pain;Patient tolerated treatment well;Patient limited by fatigue    Behavior During Therapy  [facility] for tasks assessed/performed       Past Medical History:  Diagnosis Date  . Allergy    seasonal allergies  . Anemia 2014   needed 5 units of blood d/t passing out, weak  . Anxiety   . Aortic stenosis 12/24/2017   Echo Aug 2018  . Arthritis   . Asthma    allergy induced asthma  . Cardiac murmur 12/12/2017  . Cataract   . Cataract    left  . Complication of anesthesia    arrhythmia following colonoscopy  . Degenerative disc disease, lumbar   . Degenerative disc disease, lumbar   . Depression   . Diabetes mellitus   . Diabetic neuropathy ([place]) 11/16/2015  . Dysrhythmia    stenosis of left ventricle  . GERD (gastroesophageal reflux disease)   . H/O transfusion    patient was given 5 units of blood while at [place], blood type O+  . History of chicken pox   . History of hiatal hernia   . History of measles, mumps, or rubella   . HOH (hard of hearing)    does not use  hearing aides yet  . Hyperlipidemia   . Hypertension   . Irregular heartbeat   . LVH (left ventricular hypertrophy) 12/24/2017   Echo Aug 2018  . Neuropathy   . Opiate use 11/16/2015  . Peripheral arterial disease ([place]) 05/06/2018   At rest, left > right; refer to vasc  . Pes planus of both feet 11/16/2015  . Plantar fasciitis of right foot 11/16/2015  . Wheezing     Past Surgical History:  Procedure Laterality Date  . ABDOMINAL HYSTERECTOMY    . APPLICATION OF WOUND VAC Left 07/23/2018   Procedure: APPLICATION OF WOUND VAC;  Surgeon: Algernon Huxley, MD;  Location: [facility];  Service: Vascular;  Laterality: Left;  . banck injections    . CATARACT EXTRACTION W/PHACO Right 05/30/2015   Procedure: CATARACT EXTRACTION PHACO AND INTRAOCULAR LENS PLACEMENT (IOC);  Surgeon: Birder Robson, MD;  Location: [facility];  Service: Ophthalmology;  Laterality: Right;  [place] 00:57 AP% 20.9 CDE 11.99 fluid pack lot #[id number]  . CHOLECYSTECTOMY  1970  . COLON SURGERY  2013   blocked colon  . COLONOSCOPY WITH PROPOFOL N/A 01/20/2018   Procedure: COLONOSCOPY WITH PROPOFOL;  Surgeon: Lucilla Lame, MD;  Location:  [facility];  Service: Endoscopy;  Laterality: N/A;  . ENDARTERECTOMY FEMORAL Left 07/08/2018   Procedure: ENDARTERECTOMY FEMORAL;  Surgeon: Algernon Huxley, MD;  Location: [facility];  Service: Vascular;  Laterality: Left;  . EYE SURGERY Right    cataract surgery  . FOOT FUSION Right 2018   metal in foot  . GALLBLADDER SURGERY  1970  . GASTRIC BYPASS  2010   lost 178 lbs and regained 40 lbs last few years  . HUMERUS FRACTURE SURGERY Right    metal plate with screws  . internal bleeding  2016   ulcer in past  . LOWER EXTREMITY ANGIOGRAM Left 07/08/2018   Procedure: LOWER EXTREMITY ANGIOGRAM;  Surgeon: Algernon Huxley, MD;  Location: [facility];  Service: Vascular;  Laterality: Left;  . LOWER EXTREMITY ANGIOGRAPHY Left 05/27/2018   Procedure: LOWER EXTREMITY ANGIOGRAPHY;  Surgeon: Algernon Huxley, MD;   Location: [facility];  Service: Cardiovascular;  Laterality: Left;  . LOWER EXTREMITY ANGIOGRAPHY Right 12/07/2018   Procedure: LOWER EXTREMITY ANGIOGRAPHY;  Surgeon: Algernon Huxley, MD;  Location: [facility];  Service: Cardiovascular;  Laterality: Right;  . MOUTH SURGERY     root canals and crowns and extractions  . OVARY SURGERY    . SKIN GRAFT Right 2018   RT foot. foot has been rebuilt.  it is full of metal  . TENOTOMY ACHILLES TENDON Right    Percuntaneous. metal in foot  . TONSILLECTOMY    . WOUND DEBRIDEMENT Left 07/23/2018   Procedure: DEBRIDEMENT WOUND;  Surgeon: Algernon Huxley, MD;  Location: [facility];  Service: Vascular;  Laterality: Left;    There were no vitals filed for this visit.  Subjective Assessment - 04/08/19 1035    Subjective  Patient reports L side pain 5/10 pain this morning and used heating pad which improved the pain. Her R leg posterior pain still present. Patient went to the vascular doctor who said she needs another vascular procedure near her thigh and is very happy patient is going to phsycal therapy and exercising. Patient states doctor did not have any exercise restrictions for her.    Pertinent History  Patient is a 73 y.o. female who presents to outpatient physical therapy with a referral for medical diagnosis of degeneration of lumbar intervertebral disc. This patient's chief complaints consist of chronic low back pain and foot pain, weakness, stiffness, leading to the following functional deficits: difficulty with with basic ADLs, IADLs, ambulation. She used to get injections to help control back pain but it has recently worsened due to being unable to get injections during [place] pandemic. The patient has been informed of current processes in place at [facility]patient [facility] to protect patients from Covid-19 exposure including social distancing, schedule modifications, and new cleaning procedures. After discussing their particular risk with a therapist  based on the patient's personal risk factors, the patient has decided to proceed with in-person therapy. Pt is scheduled for R LE angiography on 12/07/2018. She has recently started smoking again with the stress of COVID19 pandemic and feels strongly she needs to stop. Surgeon has stated in the past he will not complete surgery if she is smoking.  Relevant past medical history and comorbidities include underwent extensive vascular surgeries in Jan 2020 to improve blood flow, similar surgery for R leg scheduled 12/07/2018. Has a history of left carotid artery stenosis, but not enough to have surgery, has a history of multiple arterial surgeries, history of R cataract surgery, colon surgery, cholecystectomy,  R foot skin graft, ovary surgery, gastric bypass, R foot fusion, abdominal hysterectomy; current smoker, plantar fasciitis, peripheral arterial disease, LVH, diabetes mellitus with neuropathy, irregular heartbeat, hiatal hernia, GERD, stenosis of left heart ventricle, depression, lumbar degenerative disc disease, cardiac murmur, asthma, anxiety, obesity, HTN, and seasonal allergies She has been advised not to lift over 10 pounds by physician    Limitations  Sitting;Standing;Walking;House hold activities;Lifting    How long can you sit comfortably?  > 1 hour in almost any chair    How long can you stand comfortably?  4-5 minutes    How long can you walk comfortably?  2 minutes, depends on what she has with her. Able to walk at fair with rollator. To mailbox 120 feet causes shaking in R leg.      Diagnostic tests  MRI lumbar spine report 06/14/2017: "IMPRESSION: 1. Stable degenerative of lumbar spondylosis and scoliosis with multilevel disc disease and facet disease. 2. Stable right foraminal stenosis at L5-S1 due to right-sided disc    Currently in Pain?  Yes    Pain Score  5     Pain Location  Back    Pain Orientation  Left    Pain Onset  More than a month ago       TREATMENT  Therapeutic exercise:to  centralize symptoms and improve ROM, strength, muscular endurance, and activity tolerance required for successful completion of functional activities.  NuStep27mnlevel 3 ( 5 mins with BUEs and BLEs);4(518m withoutBUEs)Seatsetting 8/handle setting7. For improved extremity mobility, muscular endurance, and activity tolerance; and to induce the analgesic effect of aerobic exercise, stimulate improved joint nutrition, and prepare body structures and systems for following interventions. Instructed to attempt to keepSPM above60during subjective exam.Average SPM =53, 0.3360ms. UE strengthening without back rest to improve lumbar extension and core stabilization  Seated 2lb(2x10reps)/3lb(3x6reps)dumbbell shoulder flexion.   Seated 2lb(2x10reps)/3lb(3x6reps)dumbbell shoulder horizontal abduction.  Seated 2lb(2x10reps)/3lb(3x6reps) dumbbell shoulderabduction.  Seated 1lb shoulder flexion to the end of pain free range with x10 shoulder elevation and depression.   Seated 2lb(2x10reps)/3lb(3x6reps)dumbbell shoulder press up.  Patient states she felt stronger on her upper arms. Cuing provided to reinforce deep neck flexors activation and relaxation of bilateral upper traps.   Therapeutic activities: for functional strengthening and improved functional activity tolerance.  Ambulation step up with single LE x 10 each side. Requires BUEs support on railings.     Patient tolerated the sessionwell and no increase of her lower back pain at the end. Patient demonstrated good motivation towards rehab outcomes. Patient demonstrated increase activity tolerance with heavier weights for UE strengthening. Patient also presented increased muscle coordination and sequencing with UE exercise with less cuing. With the aforementioned gain, session will continue focus on strengthening, endurance, and increase of functional mobility tolerance. Patient would benefit from continued physical therapy to  address self-care for her pain, strength, and endurance to improve functional mobility and ADLs tolerance.    PT Education - 04/08/19 1041    Education provided  Yes    Education Details  Exercise purpose/form. Self management techniques    Person(s) Educated  Patient    Methods  Explanation;Demonstration;Tactile cues;Verbal cues    Comprehension  Verbalized understanding;Returned demonstration;Verbal cues required;Tactile cues required       PT Short Term Goals - 03/23/19 1413      PT SHORT TERM GOAL #1   Title  Be independent with initial home exercise program for self-management of symptoms.    Baseline  Initial HEP provided at IE (11/17/2018); was completing  until vascular procedure, re-instated 12/15/2018 (12/15/2018); currently participating (01/26/2019); Partially doing HEP due the patient's recent health condition/weakness(03/23/2019);    Time  2    Period  Weeks    Status  Partially Met    Target Date  04/06/19        PT Long Term Goals - 03/23/19 1415      PT LONG TERM GOAL #1   Title  Be independent with a long-term home exercise program for self-management of symptoms.     Baseline  Initial HEP provided at [facility] (11/17/2018); was doing better before vascular surgery, is working at returning to [facility] since procedure (12/15/2018); currently participating but not assigned final long term HEP yet (01/26/2019); currently participating but not assigned final long term HEP yet (03/23/2019);    Time  12    Period  Weeks    Status  Partially Met    Target Date  04/20/19      PT LONG TERM GOAL #2   Title  Patient will improve 6MWT to equal or greater than 1000 feet with LRAD to demonstrate improved activity tolerance for community ambulation.     Baseline  6 Minute Walk Test: 200 feet with SPC and supervision. Stopped at 1:43 due to fatigue and leg pain. (11/17/2018); 457 feet SPC, on seated and one standing break (12/15/2018); 547 with SPC, 2 seated and one standing rest break (01/26/2019);  468 feet with SPC, 2 seated breaks(03/23/2019);    Time  12    Period  Weeks    Status  Partially Met    Target Date  04/20/19      PT LONG TERM GOAL #3   Title  Patient will demonstrate improved 5 Times Sit to Stand test to 11 seconds from chair height with no UE support to demonstrate improvement in LE power and functional strength for daily activities.    Baseline  5 Time Sit to Stand: 27 seconds from chair height no UE support (11/17/2018); 18 seconds, no UE support from chair-high plinth (12/15/2018); 16 seconds with no UE support from chair (01/26/2019); 17.45s with lack of knee extension in standing(03/23/2019);    Time  12    Period  Weeks    Status  Partially Met    Target Date  04/20/19      PT LONG TERM GOAL #4   Title  Patient will ambulate faster than 1.3ms on the 10 Meter Walk Test to improve ability to cross street safey for community participation.     Baseline  testing deferred to next session (11/17/2018); 1.37 m/sec with SPC (11/24/2018); 1.3 m/sec with SPC (12/15/2018; 01/26/2019); 0.471m (03/23/2019);    Time  12    Period  Weeks    Status  Achieved    Target Date  04/20/19      PT LONG TERM GOAL #5   Title  Patient will score equal or greater than  25/30 on the Functional Gait Assessment to demonstrate low fall risk to improve her ability to participate safely in community activities.     Baseline  testing deferred to next session (11/17/2018); 11/30 high fall risk (11/24/2018); unable to measure due to time limitation (12/15/2018); 13/30 high fall risk (12/17/2018); 14/30 high fall risk (01/26/2019); 14/30 high fall risk(03/23/2019);    Time  12    Period  Weeks    Status  Partially Met    Target Date  04/20/19      PT LONG TERM GOAL #6   Title  Reduce pain to equal or less than 4/10 with functional activities to allow patient to complete valued functional tasks such as caring for dogs, grocery shopping, walking with less difficulty.(03/23/2019);    Baseline  8/10  (01/26/2019); 6/10 (03/23/2019)    Time  12    Period  Weeks    Status  New    Target Date  04/20/19            Plan - 04/08/19 1043    Clinical Impression Statement  Patient tolerated the session well and no increase of her lower back pain at the end. Patient demonstrated good motivation towards rehab outcomes. Patient demonstrated increase activity tolerance with heavier weights for UE strengthening. Patient also presented increased muscle coordination and sequencing with UE exercise with less cuing. With the aforementioned gain, session will continue focus on strengthening, endurance, and increase of functional mobility tolerance. Patient would benefit from continued physical therapy to address self-care for her pain, strength, and endurance to improve functional mobility and ADLs tolerance.    Personal Factors and Comorbidities  Age;Comorbidity 3+;Fitness;Time since onset of injury/illness/exacerbation;Past/Current Experience;Other    Comorbidities  underwent extensive vascular surgeries in Jan 2020 to improve blood flow, similar surgery for R leg scheduled 12/07/2018. Has a history of left carotid artery stenosis, but not enough to have surgery, has a history of multiple arterial surgeries, history of R cataract surgery, colon surgery, cholecystectomy, R foot skin graft, ovary surgery, gastric bypass, R foot fusion, abdominal hysterectomy; current smoker, plantar fasciitis, peripheral arterial disease, LVH, diabetes mellitus with neuropathy, irregular heartbeat, hiatal hernia, GERD, stenosis of left heart ventricle, depression, lumbar degenerative disc disease, cardiac murmur, asthma, anxiety, obesity, HTN, and seasonal allergies She has been advised not to lift over 10 pounds by physician.      Examination-Activity Limitations  Bathing;Carry;Lift;Sit;Stand;Locomotion Level;Toileting;Dressing;Squat;Transfers;Stairs;Hygiene/Grooming;Other    Examination-Participation Restrictions  Community  Activity;Volunteer;Cleaning;Interpersonal Relationship;Yard Work;Other    Rehab Potential  Fair    Clinical Impairments Affecting Rehab Potential  (+) motivated (-) muliple co morbidities, chronic condition    PT Frequency  2x / week    PT Duration  12 weeks    PT Treatment/Interventions  Moist Heat;Patient/family education;Neuromuscular re-education;Therapeutic exercise;Manual techniques;ADLs/Self Care Home Management;Electrical Stimulation;Gait training;Stair training;Functional mobility training;Therapeutic activities;Balance training;Passive range of motion;Dry needling;Energy conservation;Spinal Manipulations;Joint Manipulations;Cryotherapy    PT Next Visit Plan  continue manual and strengthening activities as tolerated    PT Home Exercise Plan  Medbridge Access Code: [email or web address]    Consulted and Agree with Plan of Care  Patient       Patient will benefit from skilled therapeutic intervention in order to improve the following deficits and impairments:  Decreased strength, Impaired flexibility, Decreased activity tolerance, Impaired perceived functional ability, Pain, Decreased endurance, Difficulty walking, Abnormal gait, Decreased knowledge of use of DME, Decreased skin integrity, Decreased range of motion, Impaired sensation, Improper body mechanics, Obesity, Postural dysfunction, Increased edema, Decreased mobility, Decreased balance, Cardiopulmonary status limiting activity, Decreased coordination  Visit Diagnosis: Muscle weakness (generalized)  Chronic bilateral low back pain, unspecified whether sciatica present  Difficulty in walking, not elsewhere classified  History of falling     Problem List Patient Active Problem List   Diagnosis Date Noted  . Osteopenia 03/03/2019  . Lower extremity edema 07/31/2018  . Pressure injury of skin 07/24/2018  . Surgical site infection 07/23/2018  . Wound of left leg 07/23/2018  . Atherosclerosis of artery of extremity with rest pain ([place])  07/08/2018  . Shortness of breath 06/02/2018  .  Bruit 06/02/2018  . Coronary artery disease of native artery of native heart with stable angina pectoris ([place]) 06/02/2018  . Atherosclerosis of native arteries of extremity with intermittent claudication ([place]) 05/19/2018  . Peripheral arterial disease ([place]) 05/06/2018  . Personal history of colonic polyps   . Aortic stenosis 12/24/2017  . LVH (left ventricular hypertrophy) 12/24/2017  . Hypertension with heart disease 12/24/2017  . Left atrial dilatation 12/12/2017  . Cardiac murmur 12/12/2017  . Gastrojejunal ulcer 12/04/2017  . Hyperphosphatemia 12/04/2017  . Spinal stenosis of lumbar region 06/18/2017  . Degeneration of lumbar intervertebral disc 06/02/2017  . Assistance needed with transportation 05/26/2017  . Financial difficulties 05/26/2017  . Needs assistance with community resources 05/26/2017  . Moderate recurrent major depression ([place]) 05/26/2017  . Non-healing wound of lower extremity 02/06/2017  . Status post ankle arthrodesis 12/11/2016  . Controlled substance agreement broken 10/11/2016  . Low back pain 09/25/2016  . Osteoarthritis of right subtalar joint 07/11/2016  . Posterior tibial tendinitis of right lower extremity 07/11/2016  . Breast cancer screening 06/18/2016  . Knee pain 04/03/2016  . Hyponatremia 03/01/2016  . High triglycerides 12/24/2015  . Pes planus of both feet 11/16/2015  . Plantar fasciitis of right foot 11/16/2015  . Heel spur 11/16/2015  . Diabetic neuropathy ([place]) 11/16/2015  . Right ankle pain 11/16/2015  . Medication monitoring encounter 11/16/2015  . Chronic pain of multiple joints 08/15/2015  . Abnormal mammogram of right breast 06/19/2015  . Cerumen impaction 03/16/2015  . Hyperlipidemia 12/14/2014  . History of transfusion of packed RBC 12/14/2014  . Hx of smoking 12/14/2014  . Status post bariatric surgery 12/14/2014  . Morbid obesity ([place]) 12/14/2014  . Chronic radicular lumbar  pain 12/14/2014  . History of GI bleed 11/12/2014  . History of small bowel obstruction 09/07/2014   Sherrilyn Rist, SPT 04/08/19, 7:16 PM  Everlean Alstrom. [name], PT, DPT 04/08/19, 7:16 PM  [facility] [address] [place], [address] Phone: [phone]   Fax:  [phone]  Name: Andrea Holden MRN: [id number] Date of Birth: 09-Oct-1945

## 2019-04-09 ENCOUNTER — Telehealth (INDEPENDENT_AMBULATORY_CARE_PROVIDER_SITE_OTHER): Payer: Self-pay

## 2019-04-09 NOTE — Telephone Encounter (Signed)
I attempted to contact the patient to schedule a leg angio. A message was left for a return call.

## 2019-04-12 ENCOUNTER — Telehealth (INDEPENDENT_AMBULATORY_CARE_PROVIDER_SITE_OTHER): Payer: Self-pay

## 2019-04-12 ENCOUNTER — Encounter (INDEPENDENT_AMBULATORY_CARE_PROVIDER_SITE_OTHER): Payer: Self-pay | Admitting: Nurse Practitioner

## 2019-04-12 NOTE — Progress Notes (Signed)
SUBJECTIVE:  Patient ID: Andrea Holden, female    DOB: [birth year], 73 y.o.   MRN: [id number] Chief Complaint  Patient presents with   Follow-up    HPI  Andrea Holden is a 73 y.o. female The patient returns to the office for followup and review of the noninvasive studies. There has been a worsening in the lower extremity symptoms.  The patient notes interval shortening of their claudication distance and development of mild rest pain symptoms in the left lower extremity. No new ulcers or wounds have occurred since the last visit.  There have been no significant changes to the patient's overall health care.  The patient also endorses having issues with balance however she has a multitude of orthopedic issues such as back surgeries as well as ankle surgeries.  Patient also has a right hamstring pull.  The patient denies amaurosis fugax or recent TIA symptoms. There are no recent neurological changes noted. The patient denies history of DVT, PE or superficial thrombophlebitis. The patient denies recent episodes of angina or shortness of breath.   ABI's Rt=0.82 and Lt=0.60 (previous ABI's Rt=0.89 and Lt=0.70) Duplex US of the lower extremity arterial system shows biphasic waveforms throughout the right lower extremity except for monophasic at the distal posterior tibial artery.  The left lower extremity has monophasic flow throughout suggesting inflow disease.  There are increased velocities at the common femoral artery.  Past Medical History:  Diagnosis Date   Allergy    seasonal allergies   Anemia 2014   needed 5 units of blood d/t passing out, weak   Anxiety    Aortic stenosis 12/24/2017   Echo Aug 2018   Arthritis    Asthma    allergy induced asthma   Cardiac murmur 12/12/2017   Cataract    Cataract    left   Complication of anesthesia    arrhythmia following colonoscopy   Degenerative disc disease, lumbar    Degenerative disc disease, lumbar     Depression    Diabetes mellitus    Diabetic neuropathy (HCC) 11/16/2015   Dysrhythmia    stenosis of left ventricle   GERD (gastroesophageal reflux disease)    H/O transfusion    patient was given 5 units of blood [facility], blood type O+   History of chicken pox    History of hiatal hernia    History of measles, mumps, or rubella    HOH (hard of hearing)    does not use hearing aides yet   Hyperlipidemia    Hypertension    Irregular heartbeat    LVH (left ventricular hypertrophy) 12/24/2017   Echo Aug 2018   Neuropathy    Opiate use 11/16/2015   Peripheral arterial disease (HCC) 05/06/2018   At rest, left > right; refer to vasc   Pes planus of both feet 11/16/2015   Plantar fasciitis of right foot 11/16/2015   Wheezing     Past Surgical History:  Procedure Laterality Date   ABDOMINAL HYSTERECTOMY     APPLICATION OF WOUND VAC Left 07/23/2018   Procedure: APPLICATION OF WOUND VAC;  Surgeon: Annice Needyew, Jason S, MD;  Location: [facility] ORS;  Service: Vascular;  Laterality: Left;   banck injections     CATARACT EXTRACTION W/PHACO Right 05/30/2015   Procedure: CATARACT EXTRACTION PHACO AND INTRAOCULAR LENS PLACEMENT (IOC);  Surgeon: Galen ManilaWilliam Porfilio, MD;  Location: [facility];  Service: Ophthalmology;  Laterality: Right;  US 00:57 AP% 20.9 CDE 11.99 fluid pack lot #[id number]  CHOLECYSTECTOMY  1970   COLON SURGERY  2013   blocked colon   COLONOSCOPY WITH PROPOFOL N/A 01/20/2018   Procedure: COLONOSCOPY WITH PROPOFOL;  Surgeon: Midge Minium, MD;  Location: [facility] ENDOSCOPY;  Service: Endoscopy;  Laterality: N/A;   ENDARTERECTOMY FEMORAL Left 07/08/2018   Procedure: ENDARTERECTOMY FEMORAL;  Surgeon: Annice Needy, MD;  Location: [facility];  Service: Vascular;  Laterality: Left;   EYE SURGERY Right    cataract surgery   FOOT FUSION Right 2018   metal in foot   GALLBLADDER SURGERY  1970   GASTRIC BYPASS  2010   lost 178 lbs and regained 40 lbs last few years    HUMERUS FRACTURE SURGERY Right    metal plate with screws   internal bleeding  2016   ulcer in past   LOWER EXTREMITY ANGIOGRAM Left 07/08/2018   Procedure: LOWER EXTREMITY ANGIOGRAM;  Surgeon: Annice Needy, MD;  Location: [facility];  Service: Vascular;  Laterality: Left;   LOWER EXTREMITY ANGIOGRAPHY Left 05/27/2018   Procedure: LOWER EXTREMITY ANGIOGRAPHY;  Surgeon: Annice Needy, MD;  Location: [facility] IN[facility];  Service: Cardiovascular;  Laterality: Left;   LOWER EXTREMITY ANGIOGRAPHY Right 12/07/2018   Procedure: LOWER EXTREMITY ANGIOGRAPHY;  Surgeon: Annice Needy, MD;  Location: [facility] IN[facility];  Service: Cardiovascular;  Laterality: Right;   MOUTH SURGERY     root canals and crowns and extractions   OVARY SURGERY     SKIN GRAFT Right 2018   RT foot. foot has been rebuilt.  it is full of metal   TENOTOMY ACHILLES TENDON Right    Percuntaneous. metal in foot   TONSILLECTOMY     WOUND DEBRIDEMENT Left 07/23/2018   Procedure: DEBRIDEMENT WOUND;  Surgeon: Annice Needy, MD;  Location: [facility];  Service: Vascular;  Laterality: Left;    Social History   Socioeconomic History   Marital status: Divorced    Spouse name: Not on file   Number of children: 1   Years of education: Not on file   Highest education level: Associate degree: academic program  Occupational History   Occupation: Lawyer    Comment: [employer]  Social Needs   Financial resource strain: Very hard   Food insecurity    Worry: Never true    Inability: Never true   Transportation needs    Medical: No    Non-medical: No  Tobacco Use   Smoking status: Current Some Day Smoker    Packs/day: 0.50    Years: 55.00    Pack years: 27.50    Types: Cigarettes    Start date: 07/01/1960    Last attempt to quit: 11/22/2018    Years since quitting: 0.3   Smokeless tobacco: Never Used   Tobacco comment: trying to quit again, using chantix  Substance and  Sexual Activity   Alcohol use: Yes    Alcohol/week: 0.0 standard drinks    Comment: 2 drinks per month   Drug use: Not Currently    Types: Marijuana    Comment: used for pain   Sexual activity: Not Currently  Lifestyle   Physical activity    Days per week: 7 days    Minutes per session: 10 min   Stress: To some extent  Relationships   Social connections    Talks on phone: Three times a week    Gets together: Once a week    Attends religious service: 1 to 4 times per year  Active member of club or organization: Yes    Attends meetings of clubs or organizations: More than 4 times per year    Relationship status: Divorced   Intimate partner violence    Fear of current or ex partner: No    Emotionally abused: No    Physically abused: No    Forced sexual activity: No  Other Topics Concern   Not on file  Social History Narrative   Not on file    Family History  Problem Relation Age of Onset   Asthma Mother    COPD Mother    Arthritis Father    Depression Father    Heart disease Father    Hypertension Father    Stroke Father    Heart attack Father    Arthritis Brother    Depression Brother    Diabetes Brother    Heart disease Brother    Hyperlipidemia Brother    Hypertension Brother    Stroke Brother    Vision loss Brother    Heart attack Brother    Diabetes Maternal Grandmother    Thyroid disease Daughter    Colitis Daughter    Breast cancer Neg Hx     Allergies  Allergen Reactions   Meloxicam Other (See Comments)    GI bleeding   Nsaids Other (See Comments)    Gi bleeding   Sitagliptin Hives and Itching    Januvia      Review of Systems   Review of Systems: Negative Unless Checked Constitutional: Weight loss  Fever  Chills Cardiac: Chest pain    Atrial Fibrillation  Palpitations   Shortness of breath when laying flat   Shortness of breath with exertion. Shortness of breath at rest Vascular:   Pain in legs with walking   Pain in legs with standing Pain in legs when laying flat   Claudication    Pain in feet when laying flat    History of DVT   Phlebitis   Swelling in legs   Varicose veins   Non-healing ulcers Pulmonary:   Uses home oxygen   Productive cough   Hemoptysis   Wheeze  COPD   Asthma Neurologic:  Dizziness   Seizures  Blackouts History of stroke   History of TIA  Aphasia   Temporary Blindness   Weakness or numbness in arm   Weakness or numbness in leg Musculoskeletal:   Joint swelling   Joint pain   Low back pain   History of Knee Replacement Arthritis back Surgeries   Spinal Stenosis    Hematologic:  Easy bruising  Easy bleeding   Hypercoagulable state   Anemic Gastrointestinal:  Diarrhea   Vomiting  Gastroesophageal reflux/heartburn   Difficulty swallowing. Abdominal pain Genitourinary:  Chronic kidney disease   Difficult urination  Anuric   Blood in urine Frequent urination  Burning with urination   Hematuria Skin:  Rashes   Ulcers Wounds Psychological:  History of anxiety    History of major depression   Memory Difficulties      OBJECTIVE:   Physical Exam  BP (!) 194/70 (BP Location: Right Wrist, Patient Position: Sitting, Cuff Size: Normal)    Pulse 75    Resp 12    Ht  (1.549 m)    Wt 202 lb (91.6 kg)    BMI 38.17 kg/m   Gen: WD/WN, NAD Head: [place]/AT, No temporalis wasting.  Ear/Nose/Throat: Hearing grossly intact, nares w/o erythema or drainage Eyes: PER, EOMI, sclera nonicteric.  Neck: Supple,  no masses.  No JVD.  Pulmonary:  Good air movement, no use of accessory muscles.  Cardiac: RRR Vascular:  Vessel Right Left  Radial Palpable Palpable  Dorsalis Pedis Trace Palpable Trace Palpable  Posterior Tibial Trace Palpable Trace Palpable   Gastrointestinal: soft, non-distended. No guarding/no peritoneal signs.  Musculoskeletal: Ambulates with  cane No deformity or atrophy.  Neurologic: Pain and light touch intact in extremities.  Symmetrical.  Speech is fluent. Motor exam as listed above. Psychiatric: Judgment intact, Mood & affect appropriate for pt's clinical situation. Dermatologic: No Venous rashes. No Ulcers Noted.  No changes consistent with cellulitis. Lymph : No Cervical lymphadenopathy, no lichenification or skin changes of chronic lymphedema.       ASSESSMENT AND PLAN:  1. Atherosclerosis of native artery of both lower extremities with intermittent claudication (HCC) Recommend:  The patient has experienced increased symptoms and is now describing lifestyle limiting claudication and mild rest pain.   Given the severity of the patient's left lower extremity symptoms the patient should undergo angiography and intervention.  Risk and benefits were reviewed the patient.  Indications for the procedure were reviewed.  All questions were answered, the patient agrees to proceed.   The patient should continue walking and begin a more formal exercise program.  The patient should continue antiplatelet therapy and aggressive treatment of the lipid abnormalities  The patient will follow up with me after the angiogram.   2. Mixed hyperlipidemia Continue statin as ordered and reviewed, no changes at this time   3. Hx of smoking Smoking cessation was discussed, 3-10 minutes spent on this topic specifically   4. Degeneration of lumbar intervertebral disc Continue NSAID medications as already ordered, these medications have been reviewed and there are no changes at this time.  Continued activity and therapy was stressed.    Current Outpatient Medications on File Prior to Visit  Medication Sig Dispense Refill   acetaminophen (TYLENOL) 325 MG tablet Take 325 mg by mouth every 6 (six) hours as needed.      albuterol (VENTOLIN HFA) 108 (90 Base) MCG/ACT inhaler Inhale 2 puffs into the lungs every 6 (six) hours as needed for  wheezing or shortness of breath. 18 g 2   atorvastatin (LIPITOR) 10 MG tablet Take 1 tablet (10 mg total) by mouth at bedtime. 90 tablet 3   calcium carbonate (OS-CAL - DOSED IN MG OF ELEMENTAL CALCIUM) 1250 (500 Ca) MG tablet Take 1 tablet by mouth.     clopidogrel (PLAVIX) 75 MG tablet Take 1 tablet (75 mg total) by mouth daily. 90 tablet 3   co-enzyme Q-10 30 MG capsule Take 100 mg by mouth daily.      fluticasone (FLONASE) 50 MCG/ACT nasal spray Place 2 sprays into both nostrils daily as needed. 16 g 4   gabapentin (NEURONTIN) 400 MG capsule Take 1 capsule (400 mg total) by mouth 2 (two) times daily. 180 capsule 1   Glucosamine-Chondroit-Vit C-Mn (GLUCOSAMINE 1500 COMPLEX) CAPS Take 1 capsule by mouth daily. 90 capsule 1   HYDROcodone-acetaminophen (NORCO) 5-325 MG tablet Take 0.5 tablets by mouth every 6 (six) hours as needed for moderate pain. 30 tablet 0   lisinopril (ZESTRIL) 10 MG tablet TAKE 1 TABLET BY MOUTH ONCE DAILY 90 tablet 1   Melatonin 5 MG TABS Take 1 tablet by mouth at bedtime.      montelukast (SINGULAIR) 10 MG tablet Take 1 tablet (10 mg total) by mouth at bedtime. 30 tablet 3   Multiple Vitamins-Minerals (MULTIVITAMIN ADULTS  50+) TABS Take 1 tablet by mouth daily. 90 tablet 1   pantoprazole (PROTONIX) 20 MG tablet TAKE 1 TABLET BY MOUTH ONCE DAILY 90 tablet 0   sertraline (ZOLOFT) 100 MG tablet TAKE 1 AND 1/2 TABLETS (150 MG TOTAL) BYMOUTH DAILY 135 tablet 1   varenicline (CHANTIX) 1 MG tablet Take 1 tablet (1 mg total) by mouth 2 (two) times daily. 60 tablet 2   vitamin C (ASCORBIC ACID) 500 MG tablet Take 500 mg by mouth daily.      No current facility-administered medications on file prior to visit.     There are no Patient Instructions on file for this visit. No follow-ups on file.   Kris Hartmann, NP  This note was completed with Sales executive.  Any errors are purely unintentional.

## 2019-04-12 NOTE — Telephone Encounter (Signed)
Spoke with the patient and she wanted to be scheduled for her procedure after the election. Patient is scheduled with Dr. Lucky Cowboy for leg angio on 05/10/2019 with a 6:45 am arrival time to the [facility]. Patient will do her Covid testing on 11//10/2018 between 12:30-2:30 pm at the [facility]. Pre-procedure instructions were discussed and will be mailed to the patient.

## 2019-04-13 ENCOUNTER — Ambulatory Visit: Payer: Medicare Other | Admitting: Physical Therapy

## 2019-04-13 ENCOUNTER — Encounter: Payer: Self-pay | Admitting: Physical Therapy

## 2019-04-13 ENCOUNTER — Other Ambulatory Visit: Payer: Self-pay

## 2019-04-13 DIAGNOSIS — Z9181 History of falling: Secondary | ICD-10-CM

## 2019-04-13 DIAGNOSIS — M6281 Muscle weakness (generalized): Secondary | ICD-10-CM | POA: Diagnosis not present

## 2019-04-13 DIAGNOSIS — G8929 Other chronic pain: Secondary | ICD-10-CM

## 2019-04-13 DIAGNOSIS — R262 Difficulty in walking, not elsewhere classified: Secondary | ICD-10-CM

## 2019-04-13 NOTE — Therapy (Signed)
[facility] [address], [place], [address] Phone: [phone]   Fax:  [phone]  Physical Therapy Treatment  Patient Details  Name: Andrea Holden MRN: [id number] Date of Birth: 12-19-1945 Referring Provider (PT): Fredderick Severance, NP    Encounter Date: 04/13/2019  PT End of Session - 04/13/19 1316    Visit Number  33    Number of Visits  36    Date for PT Re-Evaluation  04/20/19    Authorization Type  [facility] Medicare reporting period from 01/26/2019 (new reporting period from 03/23/2019)    Authorization Time Period  Current cert period 8/75/6433 - 04/20/2019 (latest PN: 01/26/2019)    Authorization - Visit Number  6    Authorization - Number of Visits  10    PT Start Time  1303    PT Stop Time  1345    PT Time Calculation (min)  42 min    Equipment Utilized During Treatment  Gait belt    Activity Tolerance  Patient limited by pain;Patient tolerated treatment well;Patient limited by fatigue    Behavior During Therapy  [facility] for tasks assessed/performed       Past Medical History:  Diagnosis Date  . Allergy    seasonal allergies  . Anemia 2014   needed 5 units of blood d/t passing out, weak  . Anxiety   . Aortic stenosis 12/24/2017   Echo Aug 2018  . Arthritis   . Asthma    allergy induced asthma  . Cardiac murmur 12/12/2017  . Cataract   . Cataract    left  . Complication of anesthesia    arrhythmia following colonoscopy  . Degenerative disc disease, lumbar   . Degenerative disc disease, lumbar   . Depression   . Diabetes mellitus   . Diabetic neuropathy ([place]) 11/16/2015  . Dysrhythmia    stenosis of left ventricle  . GERD (gastroesophageal reflux disease)   . H/O transfusion    patient was given 5 units of blood while at [place], blood type O+  . History of chicken pox   . History of hiatal hernia   . History of measles, mumps, or rubella   . HOH (hard of hearing)    does not  use hearing aides yet  . Hyperlipidemia   . Hypertension   . Irregular heartbeat   . LVH (left ventricular hypertrophy) 12/24/2017   Echo Aug 2018  . Neuropathy   . Opiate use 11/16/2015  . Peripheral arterial disease ([place]) 05/06/2018   At rest, left > right; refer to vasc  . Pes planus of both feet 11/16/2015  . Plantar fasciitis of right foot 11/16/2015  . Wheezing     Past Surgical History:  Procedure Laterality Date  . ABDOMINAL HYSTERECTOMY    . APPLICATION OF WOUND VAC Left 07/23/2018   Procedure: APPLICATION OF WOUND VAC;  Surgeon: Algernon Huxley, MD;  Location: [facility];  Service: Vascular;  Laterality: Left;  . banck injections    . CATARACT EXTRACTION W/PHACO Right 05/30/2015   Procedure: CATARACT EXTRACTION PHACO AND INTRAOCULAR LENS PLACEMENT (IOC);  Surgeon: Birder Robson, MD;  Location: [facility];  Service: Ophthalmology;  Laterality: Right;  [place] 00:57 AP% 20.9 CDE 11.99 fluid pack lot #[id number]  . CHOLECYSTECTOMY  1970  . COLON SURGERY  2013   blocked colon  . COLONOSCOPY WITH PROPOFOL N/A 01/20/2018   Procedure: COLONOSCOPY WITH PROPOFOL;  Surgeon: Lucilla Lame, MD;  Location:  [facility];  Service: Endoscopy;  Laterality: N/A;  . ENDARTERECTOMY FEMORAL Left 07/08/2018   Procedure: ENDARTERECTOMY FEMORAL;  Surgeon: Algernon Huxley, MD;  Location: [facility];  Service: Vascular;  Laterality: Left;  . EYE SURGERY Right    cataract surgery  . FOOT FUSION Right 2018   metal in foot  . GALLBLADDER SURGERY  1970  . GASTRIC BYPASS  2010   lost 178 lbs and regained 40 lbs last few years  . HUMERUS FRACTURE SURGERY Right    metal plate with screws  . internal bleeding  2016   ulcer in past  . LOWER EXTREMITY ANGIOGRAM Left 07/08/2018   Procedure: LOWER EXTREMITY ANGIOGRAM;  Surgeon: Algernon Huxley, MD;  Location: [facility];  Service: Vascular;  Laterality: Left;  . LOWER EXTREMITY ANGIOGRAPHY Left 05/27/2018   Procedure: LOWER EXTREMITY ANGIOGRAPHY;  Surgeon: Algernon Huxley, MD;   Location: [facility];  Service: Cardiovascular;  Laterality: Left;  . LOWER EXTREMITY ANGIOGRAPHY Right 12/07/2018   Procedure: LOWER EXTREMITY ANGIOGRAPHY;  Surgeon: Algernon Huxley, MD;  Location: [facility];  Service: Cardiovascular;  Laterality: Right;  . MOUTH SURGERY     root canals and crowns and extractions  . OVARY SURGERY    . SKIN GRAFT Right 2018   RT foot. foot has been rebuilt.  it is full of metal  . TENOTOMY ACHILLES TENDON Right    Percuntaneous. metal in foot  . TONSILLECTOMY    . WOUND DEBRIDEMENT Left 07/23/2018   Procedure: DEBRIDEMENT WOUND;  Surgeon: Algernon Huxley, MD;  Location: [facility];  Service: Vascular;  Laterality: Left;    There were no vitals filed for this visit.  Subjective Assessment - 04/13/19 1310    Subjective  Patient reports 4.5/10 pain at L side lower back. She has been concerned for her neck and shoulders soreness since last session. She has vascular surgery schedule on 04/09/2019.    Pertinent History  Patient is a 73 y.o. female who presents to outpatient physical therapy with a referral for medical diagnosis of degeneration of lumbar intervertebral disc. This patient's chief complaints consist of chronic low back pain and foot pain, weakness, stiffness, leading to the following functional deficits: difficulty with with basic ADLs, IADLs, ambulation. She used to get injections to help control back pain but it has recently worsened due to being unable to get injections during [place] pandemic. The patient has been informed of current processes in place at [facility] to protect patients from Covid-19 exposure including social distancing, schedule modifications, and new cleaning procedures. After discussing their particular risk with a therapist based on the patient's personal risk factors, the patient has decided to proceed with in-person therapy. Pt is scheduled for R LE angiography on 12/07/2018. She has recently started smoking again  with the stress of COVID19 pandemic and feels strongly she needs to stop. Surgeon has stated in the past he will not complete surgery if she is smoking.  Relevant past medical history and comorbidities include underwent extensive vascular surgeries in Jan 2020 to improve blood flow, similar surgery for R leg scheduled 12/07/2018. Has a history of left carotid artery stenosis, but not enough to have surgery, has a history of multiple arterial surgeries, history of R cataract surgery, colon surgery, cholecystectomy, R foot skin graft, ovary surgery, gastric bypass, R foot fusion, abdominal hysterectomy; current smoker, plantar fasciitis, peripheral arterial disease, LVH, diabetes mellitus with neuropathy, irregular heartbeat, hiatal hernia, GERD, stenosis of left heart  ventricle, depression, lumbar degenerative disc disease, cardiac murmur, asthma, anxiety, obesity, HTN, and seasonal allergies She has been advised not to lift over 10 pounds by physician    Limitations  Sitting;Standing;Walking;House hold activities;Lifting    How long can you sit comfortably?  > 1 hour in almost any chair    How long can you stand comfortably?  4-5 minutes    How long can you walk comfortably?  2 minutes, depends on what she has with her. Able to walk at fair with rollator. To mailbox 120 feet causes shaking in R leg.      Diagnostic tests  MRI lumbar spine report 06/14/2017: "IMPRESSION: 1. Stable degenerative of lumbar spondylosis and scoliosis with multilevel disc disease and facet disease. 2. Stable right foraminal stenosis at L5-S1 due to right-sided disc    Currently in Pain?  Yes    Pain Score  4     Pain Location  Back    Pain Orientation  Left    Pain Onset  More than a month ago      SBA to CGA throughout the session  TREATMENT Therapeutic exercise:to centralize symptoms and improve ROM, strength, muscular endurance, and activity tolerance required for successful completion of functional  activities.  [id number]( 5 minswith BUEs and 5 mins with only BLEs)Seatsetting 8/handle setting7. For improved extremity mobility, muscular endurance, and activity tolerance; and to induce the analgesic effect of aerobic exercise, stimulate improved joint nutrition, and prepare body structures and systems for following interventions. Instructed to attempt to keepSPM above60during subjective exam.Average SPM =50, 0.347mes.  Step up with R LE leading   3inch 2x12 reps  6inch 1x12 reps.   Seated knee extension with 2lb ankle weight  3x10 reps   Seated marching x 2lb ankle weight  2x 10 reps Extra resting breaks requested between exercises due to muscle soreness  Therapeutic activities: for functional strengthening and improved functional activity tolerance.  Ambulation x 80 feet up and down on the ramp. Requires more assistance when coming done the ramp.    Ambulation x 80 feet from clinic to her car.   Patient tolerated the sessionwell and minor back pain at the end. Patient able to completed the LEs training tasks with moderate muscle soreness. Extra break time due to muscle soreness and extra education time required between sessions to further explain the anatomy and physiology for patient current conditions. Patient demonstrated the good understanding of her POC at the end. Session will continue focus on strengthening, endurance, and increase of functional mobility tolerance.Patient would benefit from continued physical therapy to address self-care for her pain, strength, and endurance to improve functional mobility and ADLs tolerance.     PT Education - 04/13/19 1316    Education provided  Yes    Education Details  Exercise purpose/form. Self management techniques    Person(s) Educated  Patient    Methods  Explanation;Demonstration;Tactile cues;Verbal cues    Comprehension  Verbalized understanding;Returned demonstration;Verbal cues required;Tactile  cues required       PT Short Term Goals - 03/23/19 1413      PT SHORT TERM GOAL #1   Title  Be independent with initial home exercise program for self-management of symptoms.    Baseline  Initial HEP provided at [facility] (11/17/2018); was completing until vascular procedure, re-instated 12/15/2018 (12/15/2018); currently participating (01/26/2019); Partially doing HEP due the patient's recent health condition/weakness(03/23/2019);    Time  2    Period  Weeks    Status  Partially Met  Target Date  04/06/19        PT Long Term Goals - 03/23/19 1415      PT LONG TERM GOAL #1   Title  Be independent with a long-term home exercise program for self-management of symptoms.     Baseline  Initial HEP provided at [facility] (11/17/2018); was doing better before vascular surgery, is working at returning to [facility] since procedure (12/15/2018); currently participating but not assigned final long term HEP yet (01/26/2019); currently participating but not assigned final long term HEP yet (03/23/2019);    Time  12    Period  Weeks    Status  Partially Met    Target Date  04/20/19      PT LONG TERM GOAL #2   Title  Patient will improve 6MWT to equal or greater than 1000 feet with LRAD to demonstrate improved activity tolerance for community ambulation.     Baseline  6 Minute Walk Test: 200 feet with SPC and supervision. Stopped at 1:43 due to fatigue and leg pain. (11/17/2018); 457 feet SPC, on seated and one standing break (12/15/2018); 547 with SPC, 2 seated and one standing rest break (01/26/2019); 468 feet with SPC, 2 seated breaks(03/23/2019);    Time  12    Period  Weeks    Status  Partially Met    Target Date  04/20/19      PT LONG TERM GOAL #3   Title  Patient will demonstrate improved 5 Times Sit to Stand test to 11 seconds from chair height with no UE support to demonstrate improvement in LE power and functional strength for daily activities.    Baseline  5 Time Sit to Stand: 27 seconds from chair height no UE  support (11/17/2018); 18 seconds, no UE support from chair-high plinth (12/15/2018); 16 seconds with no UE support from chair (01/26/2019); 17.45s with lack of knee extension in standing(03/23/2019);    Time  12    Period  Weeks    Status  Partially Met    Target Date  04/20/19      PT LONG TERM GOAL #4   Title  Patient will ambulate faster than 1.54ms on the 10 Meter Walk Test to improve ability to cross street safey for community participation.     Baseline  testing deferred to next session (11/17/2018); 1.37 m/sec with SPC (11/24/2018); 1.3 m/sec with SPC (12/15/2018; 01/26/2019); 0.459m (03/23/2019);    Time  12    Period  Weeks    Status  Achieved    Target Date  04/20/19      PT LONG TERM GOAL #5   Title  Patient will score equal or greater than  25/30 on the Functional Gait Assessment to demonstrate low fall risk to improve her ability to participate safely in community activities.     Baseline  testing deferred to next session (11/17/2018); 11/30 high fall risk (11/24/2018); unable to measure due to time limitation (12/15/2018); 13/30 high fall risk (12/17/2018); 14/30 high fall risk (01/26/2019); 14/30 high fall risk(03/23/2019);    Time  12    Period  Weeks    Status  Partially Met    Target Date  04/20/19      PT LONG TERM GOAL #6   Title  Reduce pain to equal or less than 4/10 with functional activities to allow patient to complete valued functional tasks such as caring for dogs, grocery shopping, walking with less difficulty.(03/23/2019);    Baseline  8/10 (01/26/2019); 6/10 (03/23/2019)  Time  12    Period  Weeks    Status  New    Target Date  04/20/19            Plan - 04/13/19 1317    Clinical Impression Statement  Patient tolerated the session well and minor back pain at the end. Patient able to completed the LEs training tasks with moderate muscle soreness. Extra break time due to muscle soreness and extra education time required between sessions to further explain the  anatomy and physiology for patient current conditions. Patient demonstrated the good understanding of her POC at the end. Session will continue focus on strengthening, endurance, and increase of functional mobility tolerance. Patient would benefit from continued physical therapy to address self-care for her pain, strength, and endurance to improve functional mobility and ADLs tolerance.    Personal Factors and Comorbidities  Age;Comorbidity 3+;Fitness;Time since onset of injury/illness/exacerbation;Past/Current Experience;Other    Comorbidities  underwent extensive vascular surgeries in Jan 2020 to improve blood flow, similar surgery for R leg scheduled 12/07/2018. Has a history of left carotid artery stenosis, but not enough to have surgery, has a history of multiple arterial surgeries, history of R cataract surgery, colon surgery, cholecystectomy, R foot skin graft, ovary surgery, gastric bypass, R foot fusion, abdominal hysterectomy; current smoker, plantar fasciitis, peripheral arterial disease, LVH, diabetes mellitus with neuropathy, irregular heartbeat, hiatal hernia, GERD, stenosis of left heart ventricle, depression, lumbar degenerative disc disease, cardiac murmur, asthma, anxiety, obesity, HTN, and seasonal allergies She has been advised not to lift over 10 pounds by physician.      Examination-Activity Limitations  Bathing;Carry;Lift;Sit;Stand;Locomotion Level;Toileting;Dressing;Squat;Transfers;Stairs;Hygiene/Grooming;Other    Examination-Participation Restrictions  Community Activity;Volunteer;Cleaning;Interpersonal Relationship;Yard Work;Other    Rehab Potential  Fair    Clinical Impairments Affecting Rehab Potential  (+) motivated (-) muliple co morbidities, chronic condition    PT Frequency  2x / week    PT Duration  12 weeks    PT Treatment/Interventions  Moist Heat;Patient/family education;Neuromuscular re-education;Therapeutic exercise;Manual techniques;ADLs/Self Care Home  Management;Electrical Stimulation;Gait training;Stair training;Functional mobility training;Therapeutic activities;Balance training;Passive range of motion;Dry needling;Energy conservation;Spinal Manipulations;Joint Manipulations;Cryotherapy    PT Next Visit Plan  continue manual and strengthening activities as tolerated    PT Home Exercise Plan  Medbridge Access Code: [email or web address]    Consulted and Agree with Plan of Care  Patient       Patient will benefit from skilled therapeutic intervention in order to improve the following deficits and impairments:  Decreased strength, Impaired flexibility, Decreased activity tolerance, Impaired perceived functional ability, Pain, Decreased endurance, Difficulty walking, Abnormal gait, Decreased knowledge of use of DME, Decreased skin integrity, Decreased range of motion, Impaired sensation, Improper body mechanics, Obesity, Postural dysfunction, Increased edema, Decreased mobility, Decreased balance, Cardiopulmonary status limiting activity, Decreased coordination  Visit Diagnosis: Muscle weakness (generalized)  Chronic bilateral low back pain, unspecified whether sciatica present  Difficulty in walking, not elsewhere classified  History of falling     Problem List Patient Active Problem List   Diagnosis Date Noted  . Osteopenia 03/03/2019  . Lower extremity edema 07/31/2018  . Pressure injury of skin 07/24/2018  . Surgical site infection 07/23/2018  . Wound of left leg 07/23/2018  . Atherosclerosis of artery of extremity with rest pain ([place]) 07/08/2018  . Shortness of breath 06/02/2018  . Bruit 06/02/2018  . Coronary artery disease of native artery of native heart with stable angina pectoris ([place]) 06/02/2018  . Atherosclerosis of native arteries of extremity with intermittent claudication ([place]) 05/19/2018  . Peripheral  arterial disease ([place]) 05/06/2018  . Personal history of colonic polyps   . Aortic stenosis 12/24/2017  . LVH (left  ventricular hypertrophy) 12/24/2017  . Hypertension with heart disease 12/24/2017  . Left atrial dilatation 12/12/2017  . Cardiac murmur 12/12/2017  . Gastrojejunal ulcer 12/04/2017  . Hyperphosphatemia 12/04/2017  . Spinal stenosis of lumbar region 06/18/2017  . Degeneration of lumbar intervertebral disc 06/02/2017  . Assistance needed with transportation 05/26/2017  . Financial difficulties 05/26/2017  . Needs assistance with community resources 05/26/2017  . Moderate recurrent major depression ([place]) 05/26/2017  . Non-healing wound of lower extremity 02/06/2017  . Status post ankle arthrodesis 12/11/2016  . Controlled substance agreement broken 10/11/2016  . Low back pain 09/25/2016  . Osteoarthritis of right subtalar joint 07/11/2016  . Posterior tibial tendinitis of right lower extremity 07/11/2016  . Breast cancer screening 06/18/2016  . Knee pain 04/03/2016  . Hyponatremia 03/01/2016  . High triglycerides 12/24/2015  . Pes planus of both feet 11/16/2015  . Plantar fasciitis of right foot 11/16/2015  . Heel spur 11/16/2015  . Diabetic neuropathy ([place]) 11/16/2015  . Right ankle pain 11/16/2015  . Medication monitoring encounter 11/16/2015  . Chronic pain of multiple joints 08/15/2015  . Abnormal mammogram of right breast 06/19/2015  . Cerumen impaction 03/16/2015  . Hyperlipidemia 12/14/2014  . History of transfusion of packed RBC 12/14/2014  . Hx of smoking 12/14/2014  . Status post bariatric surgery 12/14/2014  . Morbid obesity ([place]) 12/14/2014  . Chronic radicular lumbar pain 12/14/2014  . History of GI bleed 11/12/2014  . History of small bowel obstruction 09/07/2014    Sherrilyn Rist, SPT 04/13/19, 3:32 PM  Everlean Alstrom. [name], PT, DPT 04/13/19, 3:32 PM  [facility] [address], [place], [address] Phone: [phone]   Fax:  [phone]  Name: Andrea Holden MRN: [id number] Date of Birth:  May 16, 1946

## 2019-04-15 ENCOUNTER — Other Ambulatory Visit: Payer: Self-pay

## 2019-04-15 ENCOUNTER — Encounter: Payer: Self-pay | Admitting: Physical Therapy

## 2019-04-15 ENCOUNTER — Ambulatory Visit: Payer: Medicare Other | Admitting: Physical Therapy

## 2019-04-15 DIAGNOSIS — M545 Low back pain: Secondary | ICD-10-CM

## 2019-04-15 DIAGNOSIS — M6281 Muscle weakness (generalized): Secondary | ICD-10-CM | POA: Diagnosis not present

## 2019-04-15 DIAGNOSIS — Z9181 History of falling: Secondary | ICD-10-CM

## 2019-04-15 DIAGNOSIS — G8929 Other chronic pain: Secondary | ICD-10-CM

## 2019-04-15 DIAGNOSIS — R262 Difficulty in walking, not elsewhere classified: Secondary | ICD-10-CM

## 2019-04-15 NOTE — Therapy (Signed)
[facility] [address], [place], [address] Phone: [phone]   Fax:  [phone]  Physical Therapy Treatment  Patient Details  Name: Andrea Holden MRN: [id number] Date of Birth: Jul 21, 1945 Referring Provider (PT): Fredderick Severance, NP    Encounter Date: 04/15/2019  PT End of Session - 04/15/19 1553    Visit Number  34    Number of Visits  36    Date for PT Re-Evaluation  04/20/19    Authorization Type  [facility] Medicare reporting period from 01/26/2019 (new reporting period from 03/23/2019)    Authorization Time Period  Current cert period 2/35/3614 - 04/20/2019 (latest PN: 01/26/2019)    Authorization - Visit Number  7    Authorization - Number of Visits  10    PT Start Time  1346    PT Stop Time  1430    PT Time Calculation (min)  44 min    Equipment Utilized During Treatment  Gait belt    Activity Tolerance  Patient limited by pain;Patient tolerated treatment well;Patient limited by fatigue    Behavior During Therapy  [facility] for tasks assessed/performed       Past Medical History:  Diagnosis Date  . Allergy    seasonal allergies  . Anemia 2014   needed 5 units of blood d/t passing out, weak  . Anxiety   . Aortic stenosis 12/24/2017   Echo Aug 2018  . Arthritis   . Asthma    allergy induced asthma  . Cardiac murmur 12/12/2017  . Cataract   . Cataract    left  . Complication of anesthesia    arrhythmia following colonoscopy  . Degenerative disc disease, lumbar   . Degenerative disc disease, lumbar   . Depression   . Diabetes mellitus   . Diabetic neuropathy ([place]) 11/16/2015  . Dysrhythmia    stenosis of left ventricle  . GERD (gastroesophageal reflux disease)   . H/O transfusion    patient was given 5 units of blood while at [place], blood type O+  . History of chicken pox   . History of hiatal hernia   . History of measles, mumps, or rubella   . HOH (hard of hearing)    does not  use hearing aides yet  . Hyperlipidemia   . Hypertension   . Irregular heartbeat   . LVH (left ventricular hypertrophy) 12/24/2017   Echo Aug 2018  . Neuropathy   . Opiate use 11/16/2015  . Peripheral arterial disease ([place]) 05/06/2018   At rest, left > right; refer to vasc  . Pes planus of both feet 11/16/2015  . Plantar fasciitis of right foot 11/16/2015  . Wheezing     Past Surgical History:  Procedure Laterality Date  . ABDOMINAL HYSTERECTOMY    . APPLICATION OF WOUND VAC Left 07/23/2018   Procedure: APPLICATION OF WOUND VAC;  Surgeon: Algernon Huxley, MD;  Location: [facility];  Service: Vascular;  Laterality: Left;  . banck injections    . CATARACT EXTRACTION W/PHACO Right 05/30/2015   Procedure: CATARACT EXTRACTION PHACO AND INTRAOCULAR LENS PLACEMENT (IOC);  Surgeon: Birder Robson, MD;  Location: [facility];  Service: Ophthalmology;  Laterality: Right;  [place] 00:57 AP% 20.9 CDE 11.99 fluid pack lot #[id number]  . CHOLECYSTECTOMY  1970  . COLON SURGERY  2013   blocked colon  . COLONOSCOPY WITH PROPOFOL N/A 01/20/2018   Procedure: COLONOSCOPY WITH PROPOFOL;  Surgeon: Lucilla Lame, MD;  Location:  [place];  Service: Endoscopy;  Laterality: N/A;  . ENDARTERECTOMY FEMORAL Left 07/08/2018   Procedure: ENDARTERECTOMY FEMORAL;  Surgeon: Algernon Huxley, MD;  Location: [facility];  Service: Vascular;  Laterality: Left;  . EYE SURGERY Right    cataract surgery  . FOOT FUSION Right 2018   metal in foot  . GALLBLADDER SURGERY  1970  . GASTRIC BYPASS  2010   lost 178 lbs and regained 40 lbs last few years  . HUMERUS FRACTURE SURGERY Right    metal plate with screws  . internal bleeding  2016   ulcer in past  . LOWER EXTREMITY ANGIOGRAM Left 07/08/2018   Procedure: LOWER EXTREMITY ANGIOGRAM;  Surgeon: Algernon Huxley, MD;  Location: [facility];  Service: Vascular;  Laterality: Left;  . LOWER EXTREMITY ANGIOGRAPHY Left 05/27/2018   Procedure: LOWER EXTREMITY ANGIOGRAPHY;  Surgeon: Algernon Huxley, MD;   Location: [place] [facility];  Service: Cardiovascular;  Laterality: Left;  . LOWER EXTREMITY ANGIOGRAPHY Right 12/07/2018   Procedure: LOWER EXTREMITY ANGIOGRAPHY;  Surgeon: Algernon Huxley, MD;  Location: [place] [facility];  Service: Cardiovascular;  Laterality: Right;  . MOUTH SURGERY     root canals and crowns and extractions  . OVARY SURGERY    . SKIN GRAFT Right 2018   RT foot. foot has been rebuilt.  it is full of metal  . TENOTOMY ACHILLES TENDON Right    Percuntaneous. metal in foot  . TONSILLECTOMY    . WOUND DEBRIDEMENT Left 07/23/2018   Procedure: DEBRIDEMENT WOUND;  Surgeon: Algernon Huxley, MD;  Location: [facility];  Service: Vascular;  Laterality: Left;    There were no vitals filed for this visit.  Subjective Assessment - 04/15/19 1353    Subjective  Patient reports 5/10 pain at both side of her lower back. She felt her back pain worse this morning due to prolonged sitting in an uncomfortable chair for 3 hours and she states that might be the cause.    Pertinent History  Patient is a 73 y.o. female who presents to outpatient physical therapy with a referral for medical diagnosis of degeneration of lumbar intervertebral disc. This patient's chief complaints consist of chronic low back pain and foot pain, weakness, stiffness, leading to the following functional deficits: difficulty with with basic ADLs, IADLs, ambulation. She used to get injections to help control back pain but it has recently worsened due to being unable to get injections during [place] pandemic. The patient has been informed of current processes in place at [facility]patient [facility]hab to protect patients from Covid-19 exposure including social distancing, schedule modifications, and new cleaning procedures. After discussing their particular risk with a therapist based on the patient's personal risk factors, the patient has decided to proceed with in-person therapy. Pt is scheduled for R LE angiography on 12/07/2018. She has recently  started smoking again with the stress of COVID19 pandemic and feels strongly she needs to stop. Surgeon has stated in the past he will not complete surgery if she is smoking.  Relevant past medical history and comorbidities include underwent extensive vascular surgeries in Jan 2020 to improve blood flow, similar surgery for R leg scheduled 12/07/2018. Has a history of left carotid artery stenosis, but not enough to have surgery, has a history of multiple arterial surgeries, history of R cataract surgery, colon surgery, cholecystectomy, R foot skin graft, ovary surgery, gastric bypass, R foot fusion, abdominal hysterectomy; current smoker, plantar fasciitis, peripheral arterial disease, LVH, diabetes mellitus with neuropathy,  irregular heartbeat, hiatal hernia, GERD, stenosis of left heart ventricle, depression, lumbar degenerative disc disease, cardiac murmur, asthma, anxiety, obesity, HTN, and seasonal allergies She has been advised not to lift over 10 pounds by physician    Limitations  Sitting;Standing;Walking;House hold activities;Lifting    How long can you sit comfortably?  > 1 hour in almost any chair    How long can you stand comfortably?  4-5 minutes    How long can you walk comfortably?  2 minutes, depends on what she has with her. Able to walk at fair with rollator. To mailbox 120 feet causes shaking in R leg.      Diagnostic tests  MRI lumbar spine report 06/14/2017: "IMPRESSION: 1. Stable degenerative of lumbar spondylosis and scoliosis with multilevel disc disease and facet disease. 2. Stable right foraminal stenosis at L5-S1 due to right-sided disc    Currently in Pain?  Yes    Pain Score  5     Pain Location  Back    Pain Orientation  Right;Left    Pain Onset  More than a month ago       SBA to CGA throughout the session  TREATMENT Therapeutic exercise:to centralize symptoms and improve ROM, strength, muscular endurance, and activity tolerance required for successful completion of  functional activities.  NuStep69mnlevel3(with BUEs and BLEs)Seatsetting 8/handle setting7. For improved extremity mobility, muscular endurance, and activity tolerance; and to induce the analgesic effect of aerobic exercise, stimulate improved joint nutrition, and prepare body structures and systems for following interventions. Instructed to attempt to keepSPM above65during subjective exam.Average SPM =65, 0.372mes.  Step up with R/L LE leading on each side. ? 3inch 3x10 reps  ? 6inch 3x10 reps  ? 2inch airex 3x10 reps Extra resting breaks requested between exercises due to muscle soreness  Therapeutic activities: for functional strengthening and improved functional activity tolerance.   Ambulation 2x2m27m with standing break in between.   Ambulation x 80 feet from clinic to her car. Cuing for improved posture  Patient tolerated the sessionwell and patient demonstrated good understanding of her current condition and her goals in rehab at the end session. Patient demonstrated improved activity tolerance and posture during ambulation. Session willcontinuefocus on strengthening, endurance, and increase of functional mobility tolerance.Patient would benefit from continued physical therapy to address self-care for her pain, strength, and endurance to improve functional mobility and ADLs tolerance.      PT Education - 04/15/19 1553    Education provided  Yes    Education Details  Exercise purpose/form. Self management techniques    Person(s) Educated  Patient    Methods  Explanation;Demonstration;Tactile cues;Verbal cues    Comprehension  Verbalized understanding;Returned demonstration;Verbal cues required;Tactile cues required       PT Short Term Goals - 03/23/19 1413      PT SHORT TERM GOAL #1   Title  Be independent with initial home exercise program for self-management of symptoms.    Baseline  Initial HEP provided at IE (11/17/2018); was completing until  vascular procedure, re-instated 12/15/2018 (12/15/2018); currently participating (01/26/2019); Partially doing HEP due the patient's recent health condition/weakness(03/23/2019);    Time  2    Period  Weeks    Status  Partially Met    Target Date  04/06/19        PT Long Term Goals - 03/23/19 1415      PT LONG TERM GOAL #1   Title  Be independent with a long-term home exercise program for self-management of symptoms.  Baseline  Initial HEP provided at [facility] (11/17/2018); was doing better before vascular surgery, is working at returning to [facility] since procedure (12/15/2018); currently participating but not assigned final long term HEP yet (01/26/2019); currently participating but not assigned final long term HEP yet (03/23/2019);    Time  12    Period  Weeks    Status  Partially Met    Target Date  04/20/19      PT LONG TERM GOAL #2   Title  Patient will improve 6MWT to equal or greater than 1000 feet with LRAD to demonstrate improved activity tolerance for community ambulation.     Baseline  6 Minute Walk Test: 200 feet with SPC and supervision. Stopped at 1:43 due to fatigue and leg pain. (11/17/2018); 457 feet SPC, on seated and one standing break (12/15/2018); 547 with SPC, 2 seated and one standing rest break (01/26/2019); 468 feet with SPC, 2 seated breaks(03/23/2019);    Time  12    Period  Weeks    Status  Partially Met    Target Date  04/20/19      PT LONG TERM GOAL #3   Title  Patient will demonstrate improved 5 Times Sit to Stand test to 11 seconds from chair height with no UE support to demonstrate improvement in LE power and functional strength for daily activities.    Baseline  5 Time Sit to Stand: 27 seconds from chair height no UE support (11/17/2018); 18 seconds, no UE support from chair-high plinth (12/15/2018); 16 seconds with no UE support from chair (01/26/2019); 17.45s with lack of knee extension in standing(03/23/2019);    Time  12    Period  Weeks    Status  Partially Met     Target Date  04/20/19      PT LONG TERM GOAL #4   Title  Patient will ambulate faster than 1.59ms on the 10 Meter Walk Test to improve ability to cross street safey for community participation.     Baseline  testing deferred to next session (11/17/2018); 1.37 m/sec with SPC (11/24/2018); 1.3 m/sec with SPC (12/15/2018; 01/26/2019); 0.440m (03/23/2019);    Time  12    Period  Weeks    Status  Achieved    Target Date  04/20/19      PT LONG TERM GOAL #5   Title  Patient will score equal or greater than  25/30 on the Functional Gait Assessment to demonstrate low fall risk to improve her ability to participate safely in community activities.     Baseline  testing deferred to next session (11/17/2018); 11/30 high fall risk (11/24/2018); unable to measure due to time limitation (12/15/2018); 13/30 high fall risk (12/17/2018); 14/30 high fall risk (01/26/2019); 14/30 high fall risk(03/23/2019);    Time  12    Period  Weeks    Status  Partially Met    Target Date  04/20/19      PT LONG TERM GOAL #6   Title  Reduce pain to equal or less than 4/10 with functional activities to allow patient to complete valued functional tasks such as caring for dogs, grocery shopping, walking with less difficulty.(03/23/2019);    Baseline  8/10 (01/26/2019); 6/10 (03/23/2019)    Time  12    Period  Weeks    Status  New    Target Date  04/20/19            Plan - 04/15/19 1606    Clinical Impression Statement  Patient tolerated  the session well and patient demonstrated good understanding of her current condition and her goals in rehab at the end session. Patient demonstrated improved activity tolerance and posture during ambulation. Session will continue focus on strengthening, endurance, and increase of functional mobility tolerance. Patient would benefit from continued physical therapy to address self-care for her pain, strength, and endurance to improve functional mobility and ADLs tolerance.    Personal Factors and  Comorbidities  Age;Comorbidity 3+;Fitness;Time since onset of injury/illness/exacerbation;Past/Current Experience;Other    Comorbidities  underwent extensive vascular surgeries in Jan 2020 to improve blood flow, similar surgery for R leg scheduled 12/07/2018. Has a history of left carotid artery stenosis, but not enough to have surgery, has a history of multiple arterial surgeries, history of R cataract surgery, colon surgery, cholecystectomy, R foot skin graft, ovary surgery, gastric bypass, R foot fusion, abdominal hysterectomy; current smoker, plantar fasciitis, peripheral arterial disease, LVH, diabetes mellitus with neuropathy, irregular heartbeat, hiatal hernia, GERD, stenosis of left heart ventricle, depression, lumbar degenerative disc disease, cardiac murmur, asthma, anxiety, obesity, HTN, and seasonal allergies She has been advised not to lift over 10 pounds by physician.      Examination-Activity Limitations  Bathing;Carry;Lift;Sit;Stand;Locomotion Level;Toileting;Dressing;Squat;Transfers;Stairs;Hygiene/Grooming;Other    Examination-Participation Restrictions  Community Activity;Volunteer;Cleaning;Interpersonal Relationship;Yard Work;Other    Rehab Potential  Fair    Clinical Impairments Affecting Rehab Potential  (+) motivated (-) muliple co morbidities, chronic condition    PT Frequency  2x / week    PT Duration  12 weeks    PT Treatment/Interventions  Moist Heat;Patient/family education;Neuromuscular re-education;Therapeutic exercise;Manual techniques;ADLs/Self Care Home Management;Electrical Stimulation;Gait training;Stair training;Functional mobility training;Therapeutic activities;Balance training;Passive range of motion;Dry needling;Energy conservation;Spinal Manipulations;Joint Manipulations;Cryotherapy    PT Next Visit Plan  continue manual and strengthening activities as tolerated    PT Home Exercise Plan  Medbridge Access Code: [email or web address]    Consulted and Agree with Plan of Care  Patient        Patient will benefit from skilled therapeutic intervention in order to improve the following deficits and impairments:  Decreased strength, Impaired flexibility, Decreased activity tolerance, Impaired perceived functional ability, Pain, Decreased endurance, Difficulty walking, Abnormal gait, Decreased knowledge of use of DME, Decreased skin integrity, Decreased range of motion, Impaired sensation, Improper body mechanics, Obesity, Postural dysfunction, Increased edema, Decreased mobility, Decreased balance, Cardiopulmonary status limiting activity, Decreased coordination  Visit Diagnosis: Muscle weakness (generalized)  Chronic bilateral low back pain, unspecified whether sciatica present  Difficulty in walking, not elsewhere classified  History of falling     Problem List Patient Active Problem List   Diagnosis Date Noted  . Osteopenia 03/03/2019  . Lower extremity edema 07/31/2018  . Pressure injury of skin 07/24/2018  . Surgical site infection 07/23/2018  . Wound of left leg 07/23/2018  . Atherosclerosis of artery of extremity with rest pain ([place]) 07/08/2018  . Shortness of breath 06/02/2018  . Bruit 06/02/2018  . Coronary artery disease of native artery of native heart with stable angina pectoris ([place]) 06/02/2018  . Atherosclerosis of native arteries of extremity with intermittent claudication ([place]) 05/19/2018  . Peripheral arterial disease ([place]) 05/06/2018  . Personal history of colonic polyps   . Aortic stenosis 12/24/2017  . LVH (left ventricular hypertrophy) 12/24/2017  . Hypertension with heart disease 12/24/2017  . Left atrial dilatation 12/12/2017  . Cardiac murmur 12/12/2017  . Gastrojejunal ulcer 12/04/2017  . Hyperphosphatemia 12/04/2017  . Spinal stenosis of lumbar region 06/18/2017  . Degeneration of lumbar intervertebral disc 06/02/2017  . Assistance needed with transportation 05/26/2017  .  Financial difficulties 05/26/2017  . Needs assistance with  community resources 05/26/2017  . Moderate recurrent major depression ([place]) 05/26/2017  . Non-healing wound of lower extremity 02/06/2017  . Status post ankle arthrodesis 12/11/2016  . Controlled substance agreement broken 10/11/2016  . Low back pain 09/25/2016  . Osteoarthritis of right subtalar joint 07/11/2016  . Posterior tibial tendinitis of right lower extremity 07/11/2016  . Breast cancer screening 06/18/2016  . Knee pain 04/03/2016  . Hyponatremia 03/01/2016  . High triglycerides 12/24/2015  . Pes planus of both feet 11/16/2015  . Plantar fasciitis of right foot 11/16/2015  . Heel spur 11/16/2015  . Diabetic neuropathy ([place]) 11/16/2015  . Right ankle pain 11/16/2015  . Medication monitoring encounter 11/16/2015  . Chronic pain of multiple joints 08/15/2015  . Abnormal mammogram of right breast 06/19/2015  . Cerumen impaction 03/16/2015  . Hyperlipidemia 12/14/2014  . History of transfusion of packed RBC 12/14/2014  . Hx of smoking 12/14/2014  . Status post bariatric surgery 12/14/2014  . Morbid obesity ([place]) 12/14/2014  . Chronic radicular lumbar pain 12/14/2014  . History of GI bleed 11/12/2014  . History of small bowel obstruction 09/07/2014    Sherrilyn Rist, SPT 04/15/19, 4:07 PM  Everlean Alstrom. [name], PT, DPT 04/15/19, 4:31 PM   [facility] [address] [place], [address] Phone: [phone]   Fax:  [phone]  Name: Andrea Holden MRN: [id number] Date of Birth: [birth year]

## 2019-04-20 ENCOUNTER — Other Ambulatory Visit: Payer: Self-pay

## 2019-04-20 ENCOUNTER — Encounter: Payer: Self-pay | Admitting: Physical Therapy

## 2019-04-20 ENCOUNTER — Ambulatory Visit: Payer: Medicare Other | Admitting: Physical Therapy

## 2019-04-20 DIAGNOSIS — M6281 Muscle weakness (generalized): Secondary | ICD-10-CM

## 2019-04-20 DIAGNOSIS — R262 Difficulty in walking, not elsewhere classified: Secondary | ICD-10-CM

## 2019-04-20 DIAGNOSIS — K219 Gastro-esophageal reflux disease without esophagitis: Secondary | ICD-10-CM

## 2019-04-20 DIAGNOSIS — G8929 Other chronic pain: Secondary | ICD-10-CM

## 2019-04-20 DIAGNOSIS — M545 Low back pain: Secondary | ICD-10-CM

## 2019-04-20 DIAGNOSIS — Z9181 History of falling: Secondary | ICD-10-CM

## 2019-04-20 NOTE — Therapy (Signed)
[facility] [address], [place], [address] Phone: [phone]   Fax:  [phone]  Physical Therapy Treatment / Progress note/ Re-certification   Reporting from 03/23/2019 - 04/20/2019  Patient Details  Name: Andrea Holden MRN: [id number] Date of Birth: 11-May-1946 Referring Provider (PT): Cecil Cranker Bethel Born, NP  (Simultaneous filing. User may not have seen previous data.)   Encounter Date: 04/20/2019  PT End of Session - 04/20/19 1355    Visit Number  35    Number of Visits  36    Date for PT Re-Evaluation  04/20/19    Authorization Type  [facility] Medicare reporting period from  03/23/2019    Authorization Time Period  Current cert period 11/30/930 - 04/20/2019 (latest PN: 01/26/2019)    Authorization - Visit Number  8    Authorization - Number of Visits  10    PT Start Time  1301    PT Stop Time  1347    PT Time Calculation (min)  46 min    Equipment Utilized During Treatment  Gait belt    Activity Tolerance  Patient limited by pain;Patient tolerated treatment well;Patient limited by fatigue    Behavior During Therapy  [facility] for tasks assessed/performed       Past Medical History:  Diagnosis Date  . Allergy    seasonal allergies  . Anemia 2014   needed 5 units of blood d/t passing out, weak  . Anxiety   . Aortic stenosis 12/24/2017   Echo Aug 2018  . Arthritis   . Asthma    allergy induced asthma  . Cardiac murmur 12/12/2017  . Cataract   . Cataract    left  . Complication of anesthesia    arrhythmia following colonoscopy  . Degenerative disc disease, lumbar   . Degenerative disc disease, lumbar   . Depression   . Diabetes mellitus   . Diabetic neuropathy ([place]) 11/16/2015  . Dysrhythmia    stenosis of left ventricle  . GERD (gastroesophageal reflux disease)   . H/O transfusion    patient was given 5 units of blood while at [place], blood type O+  . History of chicken pox   . History  of hiatal hernia   . History of measles, mumps, or rubella   . HOH (hard of hearing)    does not use hearing aides yet  . Hyperlipidemia   . Hypertension   . Irregular heartbeat   . LVH (left ventricular hypertrophy) 12/24/2017   Echo Aug 2018  . Neuropathy   . Opiate use 11/16/2015  . Peripheral arterial disease ([place]) 05/06/2018   At rest, left > right; refer to vasc  . Pes planus of both feet 11/16/2015  . Plantar fasciitis of right foot 11/16/2015  . Wheezing     Past Surgical History:  Procedure Laterality Date  . ABDOMINAL HYSTERECTOMY    . APPLICATION OF WOUND VAC Left 07/23/2018   Procedure: APPLICATION OF WOUND VAC;  Surgeon: Algernon Huxley, MD;  Location: [facility] ORS;  Service: Vascular;  Laterality: Left;  . banck injections    . CATARACT EXTRACTION W/PHACO Right 05/30/2015   Procedure: CATARACT EXTRACTION PHACO AND INTRAOCULAR LENS PLACEMENT (IOC);  Surgeon: Birder Robson, MD;  Location: [facility] ORS;  Service: Ophthalmology;  Laterality: Right;  [place] 00:57 AP% 20.9 CDE 11.99 fluid pack lot #[id number]  . CHOLECYSTECTOMY  1970  . COLON SURGERY  2013   blocked colon  . COLONOSCOPY WITH  PROPOFOL N/A 01/20/2018   Procedure: COLONOSCOPY WITH PROPOFOL;  Surgeon: Lucilla Lame, MD;  Location: [facility] ENDOSCOPY;  Service: Endoscopy;  Laterality: N/A;  . ENDARTERECTOMY FEMORAL Left 07/08/2018   Procedure: ENDARTERECTOMY FEMORAL;  Surgeon: Algernon Huxley, MD;  Location: [facility] ORS;  Service: Vascular;  Laterality: Left;  . EYE SURGERY Right    cataract surgery  . FOOT FUSION Right 2018   metal in foot  . GALLBLADDER SURGERY  1970  . GASTRIC BYPASS  2010   lost 178 lbs and regained 40 lbs last few years  . HUMERUS FRACTURE SURGERY Right    metal plate with screws  . internal bleeding  2016   ulcer in past  . LOWER EXTREMITY ANGIOGRAM Left 07/08/2018   Procedure: LOWER EXTREMITY ANGIOGRAM;  Surgeon: Algernon Huxley, MD;  Location: [facility] ORS;  Service: Vascular;  Laterality: Left;  . LOWER EXTREMITY  ANGIOGRAPHY Left 05/27/2018   Procedure: LOWER EXTREMITY ANGIOGRAPHY;  Surgeon: Algernon Huxley, MD;  Location: [place] [facility];  Service: Cardiovascular;  Laterality: Left;  . LOWER EXTREMITY ANGIOGRAPHY Right 12/07/2018   Procedure: LOWER EXTREMITY ANGIOGRAPHY;  Surgeon: Algernon Huxley, MD;  Location: [place] [facility];  Service: Cardiovascular;  Laterality: Right;  . MOUTH SURGERY     root canals and crowns and extractions  . OVARY SURGERY    . SKIN GRAFT Right 2018   RT foot. foot has been rebuilt.  it is full of metal  . TENOTOMY ACHILLES TENDON Right    Percuntaneous. metal in foot  . TONSILLECTOMY    . WOUND DEBRIDEMENT Left 07/23/2018   Procedure: DEBRIDEMENT WOUND;  Surgeon: Algernon Huxley, MD;  Location: [facility] ORS;  Service: Vascular;  Laterality: Left;    There were no vitals filed for this visit.  Subjective Assessment - 04/20/19 1309    Subjective  Patient reports 4.5/10 pain at both side of her lower back. Patient has been doing lots of physcial activities including moving objects.    Pertinent History  Patient is a 73 y.o. female who presents to outpatient physical therapy with a referral for medical diagnosis of degeneration of lumbar intervertebral disc. This patient's chief complaints consist of chronic low back pain and foot pain, weakness, stiffness, leading to the following functional deficits: difficulty with with basic ADLs, IADLs, ambulation. She used to get injections to help control back pain but it has recently worsened due to being unable to get injections during [place] pandemic. The patient has been informed of current processes in place at Outpatient Rehab to protect patients from Covid-19 exposure including social distancing, schedule modifications, and new cleaning procedures. After discussing their particular risk with a therapist based on the patient's personal risk factors, the patient has decided to proceed with in-person therapy. Pt is scheduled for R LE  angiography on 12/07/2018. She has recently started smoking again with the stress of COVID19 pandemic and feels strongly she needs to stop. Surgeon has stated in the past he will not complete surgery if she is smoking.  Relevant past medical history and comorbidities include underwent extensive vascular surgeries in Jan 2020 to improve blood flow, similar surgery for R leg scheduled 12/07/2018. Has a history of left carotid artery stenosis, but not enough to have surgery, has a history of multiple arterial surgeries, history of R cataract surgery, colon surgery, cholecystectomy, R foot skin graft, ovary surgery, gastric bypass, R foot fusion, abdominal hysterectomy; current smoker, plantar fasciitis, peripheral arterial disease, LVH, diabetes mellitus with neuropathy,  irregular heartbeat, hiatal hernia, GERD, stenosis of left heart ventricle, depression, lumbar degenerative disc disease, cardiac murmur, asthma, anxiety, obesity, HTN, and seasonal allergies She has been advised not to lift over 10 pounds by physician    Limitations  Sitting;Standing;Walking;House hold activities;Lifting    How long can you sit comfortably?  > 1 hour in almost any chair    How long can you stand comfortably?  4-5 minutes    How long can you walk comfortably?  2 minutes, depends on what she has with her. Able to walk at fair with rollator. To mailbox 120 feet causes shaking in R leg.      Diagnostic tests  MRI lumbar spine report 06/14/2017: "IMPRESSION: 1. Stable degenerative of lumbar spondylosis and scoliosis with multilevel disc disease and facet disease. 2. Stable right foraminal stenosis at L5-S1 due to right-sided disc    Currently in Pain?  Yes    Pain Score  4     Pain Location  Back    Pain Onset  More than a month ago        [facility] PT Assessment - 04/20/19 0001      Assessment   Medical Diagnosis  Degeneration of lumbar intervertebral disc   Simultaneous filing. User may not have seen previous data.   Referring  Provider (PT)  Cecil Cranker, Bethel Born, NP    Simultaneous filing. User may not have seen previous data.   Hand Dominance  Right   Simultaneous filing. User may not have seen previous data.   Prior Therapy  participated several times with improved function.    Simultaneous filing. User may not have seen previous data.     Precautions   Precautions  Other (comment)   no lifting over 10# Simultaneous filing. User may not have seen previous data.     Restrictions   Weight Bearing Restrictions  No   Simultaneous filing. User may not have seen previous data.     Home Environment   Living Environment  Private residence   Simultaneous filing. User may not have seen previous data.   Living Arrangements  Alone   with two dogs (Shit tzu and Magazine features editor).  Simultaneous filing. User may not have seen previous data.   Type of Home  House   Simultaneous filing. User may not have seen previous data.   Home Access  Stairs to enter   Simultaneous filing. User may not have seen previous data.   Entrance Stairs-Number of Steps  3   Simultaneous filing. User may not have seen previous data.   Entrance Stairs-Rails  Right;Left;Can reach both   Simultaneous filing. User may not have seen previous data.   Home Layout  One level   Simultaneous filing. User may not have seen previous data.   Home Equipment  Tub bench;Bedside commode;Other (comment);Cane - single point;Walker - 2 wheels;Grab bars - tub/shower;Grab bars - toilet   rollator Simultaneous filing. User may not have seen previous data.     Prior Function   Level of Independence  Independent   Simultaneous filing. User may not have seen previous data.   Vocation  Part time employment   currently closed due to [place] 19 pandemic Simultaneous filing. User may not have seen previous data.   Vocation Requirements  substitute teaching   Simultaneous filing. User may not have seen previous data.   Leisure  life revolves around dog showing  (comfirmation and obedience), currently unable to do    Simultaneous filing. User  may not have seen previous data.     Cognition   Overall Cognitive Status  Within Functional Limits for tasks assessed   Simultaneous filing. User may not have seen previous data.     Observation/Other Assessments   Observations  see note from 04/20/2019 for latest objective data   Simultaneous filing. User may not have seen previous data.   Focus on Therapeutic Outcomes (FOTO)   --    Oswestry Disability Index   44%   mODI      6 minute walk test results    Aerobic Endurance Distance Walked  638   SPC, 2 seated break (1 minute each)      Standardized Balance Assessment   Five times sit to stand comments   14.03s without UE help    10 Meter Walk  0.60ms with SPC      Functional Gait  Assessment   Gait Level Surface  Walks 20 ft in less than 7 sec but greater than 5.5 sec, uses assistive device, slower speed, mild gait deviations, or deviates 6-10 in outside of the 12 in walkway width.    Change in Gait Speed  Able to change speed, demonstrates mild gait deviations, deviates 6-10 in outside of the 12 in walkway width, or no gait deviations, unable to achieve a major change in velocity, or uses a change in velocity, or uses an assistive device.   Simultaneous filing. User may not have seen previous data.   Gait with Horizontal Head Turns  Performs head turns smoothly with slight change in gait velocity (eg, minor disruption to smooth gait path), deviates 6-10 in outside 12 in walkway width, or uses an assistive device.   Simultaneous filing. User may not have seen previous data.   Gait with Vertical Head Turns  Performs head turns with no change in gait. Deviates no more than 6 in outside 12 in walkway width.    Gait and Pivot Turn  Pivot turns safely in greater than 3 sec and stops with no loss of balance, or pivot turns safely within 3 sec and stops with mild imbalance, requires small steps to catch  balance.    Step Over Obstacle  Is able to step over one shoe box (4.5 in total height) without changing gait speed. No evidence of imbalance.    Gait with Narrow Base of Support  Ambulates 4-7 steps.   Simultaneous filing. User may not have seen previous data.   Gait with Eyes Closed  Cannot walk 20 ft without assistance, severe gait deviations or imbalance, deviates greater than 15 in outside 12 in walkway width or will not attempt task.    Ambulating Backwards  Walks 20 ft, slow speed, abnormal gait pattern, evidence for imbalance, deviates 10-15 in outside 12 in walkway width.    Steps  Alternating feet, must use rail.    Total Score  17    FGA comment:  < 19 = high risk fall   last measured 01/26/2019, used SPC and SBA-CGA for safety Simultaneous filing. User may not have seen previous data.       TREATMENT:  Therapeutic exercise:to centralize symptoms and improve ROM, strength, muscular endurance, and activity tolerance required for successful completion of functional activities.  Filling out functional outcome measures (mODI 48%)(unbilled time after treatment session end) Functional testing to assess progress and improve functional endurance, strength, balance, and power to improve ability to complete ADLs. Required SBA- CGA for safety. Required cuing for directions and encouragement to  continue.  - 6MWT:679fet SPC, 2seated break - 5TSTS: 14.03seconds, no UE support from chair - 10MWT = 0.98 m/swith SPC - FGA:17/30 - Education on diagnosis, prognosis, POC, anatomy and physiology of current condition. Patient required frequent extended seated rest breaks due to [place] redirection to stay on task due to easy distraction. Required SBA - CGA for standing activities due to fall risk.   Patient has attended 348skilled physical therapy treatment sessions this episode of care and overall presents with good progress towards stated goals since last progress assessment.  Subjectively, patient reports that she has been feeling better/stronger and becoming more active (lifting rolling walkers and loaded boxes). Objectively, patient demonstrates good progression in walking distance (6MWT), LE strength and power (5TSTS), and balance (FGA). Patient has learned good self-care strategies for pain control and increased activity tolerance. Though patient demonstrated great gain from previous therapy sessions, patient continues to present with significant strength, ROM, balance, pain, activity tolerance, high fall risk impairments that are limiting ability to complete her usual ADLs, IADLs, community participation, care for her dogs, manage her home, bend, lift, ambulate community distances and complete transfers without difficulty or falling.Patient will benefit from continued skilled physical therapy intervention to address current body structure impairments and activity limitations to improve function and work towards goals set in current POC in order to return to prior level of function or maximal functional improvement.    PT Education - 04/20/19 1310    Education provided  Yes    Education Details  Exercise purpose/form. Self management techniques    Person(s) Educated  Patient    Methods  Explanation;Demonstration;Tactile cues;Verbal cues    Comprehension  Verbalized understanding;Returned demonstration;Verbal cues required;Tactile cues required       PT Short Term Goals - 04/20/19 1406      PT SHORT TERM GOAL #1   Title  Be independent with initial home exercise program for self-management of symptoms.    Baseline  Initial HEP provided at IE (11/17/2018); was completing until vascular procedure, re-instated 12/15/2018 (12/15/2018); currently participating (01/26/2019); Partially doing HEP due the patient's recent health condition/weakness(03/23/2019); Patient has been doing her HEP every other day. 04/20/2019    Time  2    Period  Weeks    Status  Partially Met     Target Date  05/04/19        PT Long Term Goals - 04/20/19 1641      PT LONG TERM GOAL #1   Title  Be independent with a long-term home exercise program for self-management of symptoms.     Baseline  Initial HEP provided at [facility] (11/17/2018); was doing better before vascular surgery, [facility] currently participating but not assigned final long term HEP yet (01/26/2019); currently participating but not assigned final long term HEP yet (03/23/2019); HEP progressing (04/20/2019)    Time  12    Period  Weeks    Status  Partially Met    Target Date  06/01/19      PT LONG TERM GOAL #2   Title  Patient will improve 6MWT to equal or greater than 1000 feet with LRAD to demonstrate improved activity tolerance for community ambulation.     Baseline  6 Minute Walk Test: 200 feet with SPC and supervision. Stopped at 1:43 due to fatigue and leg pain. (11/17/2018); 457 feet SPC, on seated and one standing break (12/15/2018); 547 with SPC, 2 seated and one standing rest break (01/26/2019);  468 feet with SPC, 2 seated breaks(03/23/2019); 638 feet with SPC, 2 seated breaks    Time  12    Period  Weeks    Status  Partially Met    Target Date  06/01/19      PT LONG TERM GOAL #3   Title  Patient will demonstrate improved 5 Times Sit to Stand test to 11 seconds from chair height with no UE support to demonstrate improvement in LE power and functional strength for daily activities.    Baseline  5 Time Sit to Stand: 27 seconds from chair height no UE support (11/17/2018); 18 seconds, no UE support from chair-high plinth (12/15/2018); 16 seconds with no UE support from chair (01/26/2019); 17.45s with lack of knee extension in standing(03/23/2019); 14.03s without BUEs help (04/20/2019)    Time  12    Period  Weeks    Status  Partially Met    Target Date  06/01/19      PT LONG TERM GOAL #4   Title  Patient will ambulate faster than 1.21ms on the 10 Meter Walk Test to  improve ability to cross street safey for community participation.     Baseline  testing deferred to next session (11/17/2018); 1.37 m/sec with SPC (11/24/2018); 1.3 m/sec with SPC (12/15/2018; 01/26/2019); 0.485m (03/23/2019); 0.98 m/sec with SPC (04/20/2019)    Time  12    Period  Weeks    Status  Partially Met    Target Date  06/01/19      PT LONG TERM GOAL #5   Title  Patient will score equal or greater than  25/30 on the Functional Gait Assessment to demonstrate low fall risk to improve her ability to participate safely in community activities.     Baseline  testing deferred to next session (11/17/2018); 11/30 high fall risk (11/24/2018); unable to measure due to time limitation (12/15/2018); 13/30 high fall risk (12/17/2018); 14/30 high fall risk (01/26/2019); 14/30 high fall risk(03/23/2019); 17/30 high fall risk (04/20/2019)    Time  12    Period  Weeks    Status  Partially Met    Target Date  06/01/19      PT LONG TERM GOAL #6   Title  Reduce pain to equal or less than 4/10 with functional activities to allow patient to complete valued functional tasks such as caring for dogs, grocery shopping, walking with less difficulty.(03/23/2019);    Baseline  8/10 (01/26/2019); 6/10 (03/23/2019); 4/10 (04/20/2019)    Time  12    Period  Weeks    Status  Partially Met    Target Date  06/01/19            Plan - 04/20/19 1356    Clinical Impression Statement  Patient has attended 3529killed physical therapy treatment sessions this episode of care and overall presents with good progress towards stated goals since last progress assessment. Subjectively, patient reports that she has been feeling better/stronger and becoming more active (lifting rolling walkers and loaded boxes). Objectively, patient demonstrates good progression in walking distance (6MWT), LE strength and power (5TSTS), and balance (FGA). Patient has learned good self-care strategies for pain control and increased activity tolerance.  Though patient demonstrated great gain from previous therapy sessions, patient continues to present with significant strength, ROM, balance, pain, activity tolerance, high fall risk impairments that are limiting ability to complete her usual ADLs, IADLs, community participation, care for her dogs, manage her home, bend, lift, ambulate community distances and complete transfers without  difficulty or falling. Patient will benefit from continued skilled physical therapy intervention to address current body structure impairments and activity limitations to improve function and work towards goals set in current POC in order to return to prior level of function or maximal functional improvement.    Personal Factors and Comorbidities  Age;Comorbidity 3+;Fitness;Time since onset of injury/illness/exacerbation;Past/Current Experience;Other    Comorbidities  underwent extensive vascular surgeries in Jan 2020 to improve blood flow, similar surgery for R leg scheduled 12/07/2018. Has a history of left carotid artery stenosis, but not enough to have surgery, has a history of multiple arterial surgeries, history of R cataract surgery, colon surgery, cholecystectomy, R foot skin graft, ovary surgery, gastric bypass, R foot fusion, abdominal hysterectomy; current smoker, plantar fasciitis, peripheral arterial disease, LVH, diabetes mellitus with neuropathy, irregular heartbeat, hiatal hernia, GERD, stenosis of left heart ventricle, depression, lumbar degenerative disc disease, cardiac murmur, asthma, anxiety, obesity, HTN, and seasonal allergies She has been advised not to lift over 10 pounds by physician.      Examination-Activity Limitations  Bathing;Carry;Lift;Sit;Stand;Locomotion Level;Toileting;Dressing;Squat;Transfers;Stairs;Hygiene/Grooming;Other    Examination-Participation Restrictions  Community Activity;Volunteer;Cleaning;Interpersonal Relationship;Yard Work;Other    Rehab Potential  Fair    Clinical Impairments  Affecting Rehab Potential  (+) motivated (-) muliple co morbidities, chronic condition    PT Frequency  2x / week    PT Duration  12 weeks    PT Treatment/Interventions  Moist Heat;Patient/family education;Neuromuscular re-education;Therapeutic exercise;Manual techniques;ADLs/Self Care Home Management;Electrical Stimulation;Gait training;Stair training;Functional mobility training;Therapeutic activities;Balance training;Passive range of motion;Dry needling;Energy conservation;Spinal Manipulations;Joint Manipulations;Cryotherapy    PT Next Visit Plan  continue manual and strengthening activities as tolerated    PT Home Exercise Plan  Med[employer] Access Code: [email or web address]    Consulted and Agree with Plan of Care  Patient       Patient will benefit from skilled therapeutic intervention in order to improve the following deficits and impairments:  Decreased strength, Impaired flexibility, Decreased activity tolerance, Impaired perceived functional ability, Pain, Decreased endurance, Difficulty walking, Abnormal gait, Decreased knowledge of use of DME, Decreased skin integrity, Decreased range of motion, Impaired sensation, Improper body mechanics, Obesity, Postural dysfunction, Increased edema, Decreased mobility, Decreased balance, Cardiopulmonary status limiting activity, Decreased coordination  Visit Diagnosis: Muscle weakness (generalized)  Chronic bilateral low back pain, unspecified whether sciatica present  Difficulty in walking, not elsewhere classified  History of falling     Problem List Patient Active Problem List   Diagnosis Date Noted  . Osteopenia 03/03/2019  . Lower extremity edema 07/31/2018  . Pressure injury of skin 07/24/2018  . Surgical site infection 07/23/2018  . Wound of left leg 07/23/2018  . Atherosclerosis of artery of extremity with rest pain ([place]) 07/08/2018  . Shortness of breath 06/02/2018  . Bruit 06/02/2018  . Coronary artery disease of native artery of native  heart with stable angina pectoris ([place]) 06/02/2018  . Atherosclerosis of native arteries of extremity with intermittent claudication ([place]) 05/19/2018  . Peripheral arterial disease ([place]) 05/06/2018  . Personal history of colonic polyps   . Aortic stenosis 12/24/2017  . LVH (left ventricular hypertrophy) 12/24/2017  . Hypertension with heart disease 12/24/2017  . Left atrial dilatation 12/12/2017  . Cardiac murmur 12/12/2017  . Gastrojejunal ulcer 12/04/2017  . Hyperphosphatemia 12/04/2017  . Spinal stenosis of lumbar region 06/18/2017  . Degeneration of lumbar intervertebral disc 06/02/2017  . Assistance needed with transportation 05/26/2017  . Financial difficulties 05/26/2017  . Needs assistance with community resources 05/26/2017  . Moderate recurrent major depression ([place]) 05/26/2017  .  Non-healing wound of lower extremity 02/06/2017  . Status post ankle arthrodesis 12/11/2016  . Controlled substance agreement broken 10/11/2016  . Low back pain 09/25/2016  . Osteoarthritis of right subtalar joint 07/11/2016  . Posterior tibial tendinitis of right lower extremity 07/11/2016  . Breast cancer screening 06/18/2016  . Knee pain 04/03/2016  . Hyponatremia 03/01/2016  . High triglycerides 12/24/2015  . Pes planus of both feet 11/16/2015  . Plantar fasciitis of right foot 11/16/2015  . Heel spur 11/16/2015  . Diabetic neuropathy ([place]) 11/16/2015  . Right ankle pain 11/16/2015  . Medication monitoring encounter 11/16/2015  . Chronic pain of multiple joints 08/15/2015  . Abnormal mammogram of right breast 06/19/2015  . Cerumen impaction 03/16/2015  . Hyperlipidemia 12/14/2014  . History of transfusion of packed RBC 12/14/2014  . Hx of smoking 12/14/2014  . Status post bariatric surgery 12/14/2014  . Morbid obesity ([place]) 12/14/2014  . Chronic radicular lumbar pain 12/14/2014  . History of GI bleed 11/12/2014  . History of small bowel obstruction 09/07/2014    Sherrilyn Rist,  SPT 04/20/19, 4:49 PM  Everlean Alstrom. [name], PT, DPT 04/20/19, 5:07 PM  [facility] [address] [place], [address] Phone: [phone]   Fax:  [phone]  Name: BATOOL MAJID MRN: [id number] Date of Birth: [birth year]

## 2019-04-21 MED ORDER — PANTOPRAZOLE SODIUM 20 MG PO TBEC: 20.0000 mg | DELAYED_RELEASE_TABLET | Freq: Every day | ORAL | 1 refills | Status: DC

## 2019-04-22 ENCOUNTER — Ambulatory Visit: Payer: Medicare Other | Admitting: Physical Therapy

## 2019-04-27 ENCOUNTER — Ambulatory Visit: Payer: Medicare Other | Admitting: Physical Therapy

## 2019-04-29 ENCOUNTER — Ambulatory Visit: Payer: Medicare Other | Admitting: Physical Therapy

## 2019-05-01 ENCOUNTER — Ambulatory Visit: Admit: 2019-05-01 | Payer: Medicare Other | Admitting: Ophthalmology

## 2019-05-01 SURGERY — PHACOEMULSIFICATION, CATARACT, WITH IOL INSERTION
Anesthesia: Topical | Laterality: Left

## 2019-05-04 ENCOUNTER — Other Ambulatory Visit: Payer: Self-pay

## 2019-05-04 ENCOUNTER — Encounter: Payer: Self-pay | Admitting: Physical Therapy

## 2019-05-04 ENCOUNTER — Ambulatory Visit: Payer: Medicare Other | Attending: Family Medicine | Admitting: Physical Therapy

## 2019-05-04 DIAGNOSIS — M545 Low back pain: Secondary | ICD-10-CM | POA: Insufficient documentation

## 2019-05-04 DIAGNOSIS — R262 Difficulty in walking, not elsewhere classified: Secondary | ICD-10-CM | POA: Diagnosis present

## 2019-05-04 DIAGNOSIS — Z9181 History of falling: Secondary | ICD-10-CM | POA: Diagnosis present

## 2019-05-04 DIAGNOSIS — G8929 Other chronic pain: Secondary | ICD-10-CM | POA: Diagnosis present

## 2019-05-04 DIAGNOSIS — M6281 Muscle weakness (generalized): Secondary | ICD-10-CM | POA: Diagnosis not present

## 2019-05-04 NOTE — Therapy (Signed)
[facility] [address], [place], [address] Phone: [phone]   Fax:  [phone]  Physical Therapy Treatment  Patient Details  Name: Andrea Holden MRN: [id number] Date of Birth: 1945-12-29 Referring Provider (PT): [place], Bethel Born, NP  (Simultaneous filing. User may not have seen previous data.)   Encounter Date: 05/04/2019  PT End of Session - 05/04/19 1322    Visit Number  36    Number of Visits  71    Date for PT Re-Evaluation  06/01/19    Authorization Type  [facility] Medicare reporting period from  03/23/2019    Authorization - Visit Number  1    Authorization - Number of Visits  10    PT Start Time  6384    PT Stop Time  1348    PT Time Calculation (min)  36 min    Equipment Utilized During Treatment  Gait belt    Activity Tolerance  Patient limited by pain;Patient tolerated treatment well;Patient limited by fatigue    Behavior During Therapy  [facility] for tasks assessed/performed       Past Medical History:  Diagnosis Date  . Allergy    seasonal allergies  . Anemia 2014   needed 5 units of blood d/t passing out, weak  . Anxiety   . Aortic stenosis 12/24/2017   Echo Aug 2018  . Arthritis   . Asthma    allergy induced asthma  . Cardiac murmur 12/12/2017  . Cataract   . Cataract    left  . Complication of anesthesia    arrhythmia following colonoscopy  . Degenerative disc disease, lumbar   . Degenerative disc disease, lumbar   . Depression   . Diabetes mellitus   . Diabetic neuropathy ([place]) 11/16/2015  . Dysrhythmia    stenosis of left ventricle  . GERD (gastroesophageal reflux disease)   . H/O transfusion    patient was given 5 units of blood while at [place], blood type O+  . History of chicken pox   . History of hiatal hernia   . History of measles, mumps, or rubella   . HOH (hard of hearing)    does not use hearing aides yet  . Hyperlipidemia   . Hypertension   . Irregular  heartbeat   . LVH (left ventricular hypertrophy) 12/24/2017   Echo Aug 2018  . Neuropathy   . Opiate use 11/16/2015  . Peripheral arterial disease ([place]) 05/06/2018   At rest, left > right; refer to vasc  . Pes planus of both feet 11/16/2015  . Plantar fasciitis of right foot 11/16/2015  . Wheezing     Past Surgical History:  Procedure Laterality Date  . ABDOMINAL HYSTERECTOMY    . APPLICATION OF WOUND VAC Left 07/23/2018   Procedure: APPLICATION OF WOUND VAC;  Surgeon: Algernon Huxley, MD;  Location: [facility] ORS;  Service: Vascular;  Laterality: Left;  . banck injections    . CATARACT EXTRACTION W/PHACO Right 05/30/2015   Procedure: CATARACT EXTRACTION PHACO AND INTRAOCULAR LENS PLACEMENT (IOC);  Surgeon: Birder Robson, MD;  Location: [facility] ORS;  Service: Ophthalmology;  Laterality: Right;  [place] 00:57 AP% 20.9 CDE 11.99 fluid pack lot #[id number]  . CHOLECYSTECTOMY  1970  . COLON SURGERY  2013   blocked colon  . COLONOSCOPY WITH PROPOFOL N/A 01/20/2018   Procedure: COLONOSCOPY WITH PROPOFOL;  Surgeon: Lucilla Lame, MD;  Location: [facility] ENDOSCOPY;  Service: Endoscopy;  Laterality: N/A;  . ENDARTERECTOMY  FEMORAL Left 07/08/2018   Procedure: ENDARTERECTOMY FEMORAL;  Surgeon: Algernon Huxley, MD;  Location: [facility] ORS;  Service: Vascular;  Laterality: Left;  . EYE SURGERY Right    cataract surgery  . FOOT FUSION Right 2018   metal in foot  . GALLBLADDER SURGERY  1970  . GASTRIC BYPASS  2010   lost 178 lbs and regained 40 lbs last few years  . HUMERUS FRACTURE SURGERY Right    metal plate with screws  . internal bleeding  2016   ulcer in past  . LOWER EXTREMITY ANGIOGRAM Left 07/08/2018   Procedure: LOWER EXTREMITY ANGIOGRAM;  Surgeon: Algernon Huxley, MD;  Location: [facility] ORS;  Service: Vascular;  Laterality: Left;  . LOWER EXTREMITY ANGIOGRAPHY Left 05/27/2018   Procedure: LOWER EXTREMITY ANGIOGRAPHY;  Surgeon: Algernon Huxley, MD;  Location: [facility];  Service: Cardiovascular;  Laterality:  Left;  . LOWER EXTREMITY ANGIOGRAPHY Right 12/07/2018   Procedure: LOWER EXTREMITY ANGIOGRAPHY;  Surgeon: Algernon Huxley, MD;  Location: [facility];  Service: Cardiovascular;  Laterality: Right;  . MOUTH SURGERY     root canals and crowns and extractions  . OVARY SURGERY    . SKIN GRAFT Right 2018   RT foot. foot has been rebuilt.  it is full of metal  . TENOTOMY ACHILLES TENDON Right    Percuntaneous. metal in foot  . TONSILLECTOMY    . WOUND DEBRIDEMENT Left 07/23/2018   Procedure: DEBRIDEMENT WOUND;  Surgeon: Algernon Huxley, MD;  Location: [facility] ORS;  Service: Vascular;  Laterality: Left;    There were no vitals filed for this visit.  Subjective Assessment - 05/04/19 1314    Subjective  Patient reports 8/10 pain at both side of her lower back. Patient was not able to come to the session due to busy at her work. Patient was not able to do her HEP at all in the post two weeks due to work.    Pertinent History  Patient is a 73 y.o. female who presents to outpatient physical therapy with a referral for medical diagnosis of degeneration of lumbar intervertebral disc. This patient's chief complaints consist of chronic low back pain and foot pain, weakness, stiffness, leading to the following functional deficits: difficulty with with basic ADLs, IADLs, ambulation. She used to get injections to help control back pain but it has recently worsened due to being unable to get injections during [place] pandemic. The patient has been informed of current processes in place at [facility] to protect patients from Covid-19 exposure including social distancing, schedule modifications, and new cleaning procedures. After discussing their particular risk with a therapist based on the patient's personal risk factors, the patient has decided to proceed with in-person therapy. Pt is scheduled for R LE angiography on 12/07/2018. She has recently started smoking again with the stress of COVID19 pandemic and feels  strongly she needs to stop. Surgeon has stated in the past he will not complete surgery if she is smoking.  Relevant past medical history and comorbidities include underwent extensive vascular surgeries in Jan 2020 to improve blood flow, similar surgery for R leg scheduled 12/07/2018. Has a history of left carotid artery stenosis, but not enough to have surgery, has a history of multiple arterial surgeries, history of R cataract surgery, colon surgery, cholecystectomy, R foot skin graft, ovary surgery, gastric bypass, R foot fusion, abdominal hysterectomy; current smoker, plantar fasciitis, peripheral arterial disease, LVH, diabetes mellitus with neuropathy, irregular heartbeat, hiatal hernia, GERD,  stenosis of left heart ventricle, depression, lumbar degenerative disc disease, cardiac murmur, asthma, anxiety, obesity, HTN, and seasonal allergies She has been advised not to lift over 10 pounds by physician    Limitations  Sitting;Standing;Walking;House hold activities;Lifting    How long can you sit comfortably?  > 1 hour in almost any chair    How long can you stand comfortably?  4-5 minutes    How long can you walk comfortably?  2 minutes, depends on what she has with her. Able to walk at fair with rollator. To mailbox 120 feet causes shaking in R leg.      Diagnostic tests  MRI lumbar spine report 06/14/2017: "IMPRESSION: 1. Stable degenerative of lumbar spondylosis and scoliosis with multilevel disc disease and facet disease. 2. Stable right foraminal stenosis at L5-S1 due to right-sided disc    Currently in Pain?  Yes    Pain Score  8     Pain Location  Back    Pain Onset  More than a month ago       TREATMENT  Therapeutic exercise:to centralize symptoms and improve ROM, strength, muscular endurance, and activity tolerance required for successful completion of functional activities.  NuStep3mnlevel3BUEs and BLEs Seatsetting 8/handle setting7. For improved extremity mobility, muscular  endurance, and activity tolerance; and to induce the analgesic effect of aerobic exercise, stimulate improved joint nutrition, and prepare body structures and systems for following interventions. Instructed to attempt to keepSPM above60during subjective exam.Average SPM =43, 0.278mes. UE strengthening without back rest to improve lumbar extension and core stabilization  Seated 1lb(2x10reps)dumbbell shoulder flexion.  Seated 1lb(2x10reps) dumbbell shoulderabduction. Extra time required by patient due to tiredness and lack of sleep recently due to her work. Patient also stated that she didn't eat today and some crackers provided to patient per request.     Vital BP: 110/60 mmhg. Denies vision changes, headaches, nauseas or abnormal pain. (75m58m)  Patient tolerated the sessionfair and limited due to her tiredness from working and lack of sleep (3hours last night) and lack of eating. Vital BP measured with 110/60 mmhg which is lower than her normal. Patient denied vision changes, headaches, nauseas or any abnormal pain. After discussion with pt, PT decided pt is appropriated to continuing PT session and pt agreed. Patient presented sleepy throughout the session but able to complete prescribed exercises during the session. Education provided to improve self-care for pain control and the importance of doing HEP at home. With the above presentation, further session will continue focus on strengthening, endurance, education for self care and increase of functional mobility tolerance.Patient would benefit from continued physical therapy to address self-care for her pain, strength, and endurance to improve functional mobility and ADLs tolerance.     PT Education - 05/04/19 1321    Education provided  Yes    Education Details  Exercise purpose/form. Self management techniques    Person(s) Educated  Patient    Methods  Explanation;Demonstration;Tactile cues;Verbal cues    Comprehension   Verbalized understanding;Returned demonstration;Verbal cues required;Tactile cues required       PT Short Term Goals - 04/20/19 1406      PT SHORT TERM GOAL #1   Title  Be independent with initial home exercise program for self-management of symptoms.    Baseline  Initial HEP provided at IE (11/17/2018); was completing until vascular procedure, re-instated 12/15/2018 (12/15/2018); currently participating (01/26/2019); Partially doing HEP due the patient's recent health condition/weakness(03/23/2019); Patient has been doing her HEP every other day. 04/20/2019  Time  2    Period  Weeks    Status  Partially Met    Target Date  05/04/19        PT Long Term Goals - 04/20/19 1641      PT LONG TERM GOAL #1   Title  Be independent with a long-term home exercise program for self-management of symptoms.     Baseline  Initial HEP provided at IE (11/17/2018); was doing better before vascular surgery, is working at returning to [facility] since procedure (12/15/2018); currently participating but not assigned final long term HEP yet (01/26/2019); currently participating but not assigned final long term HEP yet (03/23/2019); HEP progressing (04/20/2019)    Time  6    Period  Weeks    Status  Partially Met    Target Date  06/01/19      PT LONG TERM GOAL #2   Title  Patient will improve 6MWT to equal or greater than 1000 feet with LRAD to demonstrate improved activity tolerance for community ambulation.     Baseline  6 Minute Walk Test: 200 feet with SPC and supervision. Stopped at 1:43 due to fatigue and leg pain. (11/17/2018); 457 feet SPC, on seated and one standing break (12/15/2018); 547 with SPC, 2 seated and one standing rest break (01/26/2019); 468 feet with SPC, 2 seated breaks(03/23/2019); 638 feet with SPC, 2 seated breaks    Time  6    Period  Weeks    Status  Partially Met    Target Date  06/01/19      PT LONG TERM GOAL #3   Title  Patient will demonstrate improved 5 Times Sit to Stand test to 11  seconds from chair height with no UE support to demonstrate improvement in LE power and functional strength for daily activities.    Baseline  5 Time Sit to Stand: 27 seconds from chair height no UE support (11/17/2018); 18 seconds, no UE support from chair-high plinth (12/15/2018); 16 seconds with no UE support from chair (01/26/2019); 17.45s with lack of knee extension in standing(03/23/2019); 14.03s without BUEs help (04/20/2019)    Time  6    Period  Weeks    Status  Partially Met    Target Date  06/01/19      PT LONG TERM GOAL #4   Title  Patient will ambulate faster than 1.57ms on the 10 Meter Walk Test to improve ability to cross street safey for community participation.     Baseline  testing deferred to next session (11/17/2018); 1.37 m/sec with SPC (11/24/2018); 1.3 m/sec with SPC (12/15/2018; 01/26/2019); 0.436m (03/23/2019); 0.98 m/sec with SPC (04/20/2019)    Time  6    Period  Weeks    Status  Partially Met    Target Date  06/01/19      PT LONG TERM GOAL #5   Title  Patient will score equal or greater than  25/30 on the Functional Gait Assessment to demonstrate low fall risk to improve her ability to participate safely in community activities.     Baseline  testing deferred to next session (11/17/2018); 11/30 high fall risk (11/24/2018); unable to measure due to time limitation (12/15/2018); 13/30 high fall risk (12/17/2018); 14/30 high fall risk (01/26/2019); 14/30 high fall risk(03/23/2019); 17/30 high fall risk (04/20/2019)    Time  6    Period  Weeks    Status  Partially Met    Target Date  06/01/19      PT LONG TERM GOAL #  6   Title  Reduce pain to equal or less than 4/10 with functional activities to allow patient to complete valued functional tasks such as caring for dogs, grocery shopping, walking with less difficulty.(03/23/2019);    Baseline  8/10 (01/26/2019); 6/10 (03/23/2019); 4/10 (04/20/2019)    Time  6    Period  Weeks    Status  Partially Met    Target Date  06/01/19             Plan - 05/04/19 1323    Clinical Impression Statement  Patient tolerated the session fair and limited due to her tiredness from working and lack of sleep (3hours last night) and lack of eating. Vital BP measured with 110/60 mmhg which is lower than her normal. Patient denied vision changes, headaches, nauseas or any abnormal pain. After discussion with pt, PT decided pt is appropriated for continuing PT session and pt agreed. Patient presented sleepy throughout the session but able to complete prescribed exercises during the session. Education provided to improve self-care for pain control and the importance of doing HEP at home. With the above presentation, further session will continue focus on strengthening, endurance, and increase of functional mobility tolerance. Patient would benefit from continued physical therapy to address self-care for her pain, strength, and endurance to improve functional mobility and ADLs tolerance.    Personal Factors and Comorbidities  Age;Comorbidity 3+;Fitness;Time since onset of injury/illness/exacerbation;Past/Current Experience;Other    Comorbidities  underwent extensive vascular surgeries in Jan 2020 to improve blood flow, similar surgery for R leg scheduled 12/07/2018. Has a history of left carotid artery stenosis, but not enough to have surgery, has a history of multiple arterial surgeries, history of R cataract surgery, colon surgery, cholecystectomy, R foot skin graft, ovary surgery, gastric bypass, R foot fusion, abdominal hysterectomy; current smoker, plantar fasciitis, peripheral arterial disease, LVH, diabetes mellitus with neuropathy, irregular heartbeat, hiatal hernia, GERD, stenosis of left heart ventricle, depression, lumbar degenerative disc disease, cardiac murmur, asthma, anxiety, obesity, HTN, and seasonal allergies She has been advised not to lift over 10 pounds by physician.      Examination-Activity Limitations   Bathing;Carry;Lift;Sit;Stand;Locomotion Level;Toileting;Dressing;Squat;Transfers;Stairs;Hygiene/Grooming;Other    Examination-Participation Restrictions  Community Activity;Volunteer;Cleaning;Interpersonal Relationship;Yard Work;Other    Rehab Potential  Fair    Clinical Impairments Affecting Rehab Potential  (+) motivated (-) muliple co morbidities, chronic condition    PT Frequency  2x / week    PT Duration  12 weeks    PT Treatment/Interventions  Moist Heat;Patient/family education;Neuromuscular re-education;Therapeutic exercise;Manual techniques;ADLs/Self Care Home Management;Electrical Stimulation;Gait training;Stair training;Functional mobility training;Therapeutic activities;Balance training;Passive range of motion;Dry needling;Energy conservation;Spinal Manipulations;Joint Manipulations;Cryotherapy    PT Next Visit Plan  continue manual and strengthening activities as tolerated    PT Home Exercise Plan  Medbridge Access Code: [email or web address]    Consulted and Agree with Plan of Care  Patient       Patient will benefit from skilled therapeutic intervention in order to improve the following deficits and impairments:  Decreased strength, Impaired flexibility, Decreased activity tolerance, Impaired perceived functional ability, Pain, Decreased endurance, Difficulty walking, Abnormal gait, Decreased knowledge of use of DME, Decreased skin integrity, Decreased range of motion, Impaired sensation, Improper body mechanics, Obesity, Postural dysfunction, Increased edema, Decreased mobility, Decreased balance, Cardiopulmonary status limiting activity, Decreased coordination  Visit Diagnosis: Muscle weakness (generalized)  Chronic bilateral low back pain, unspecified whether sciatica present  Difficulty in walking, not elsewhere classified  History of falling     Problem List Patient Active Problem List  Diagnosis Date Noted  . Osteopenia 03/03/2019  . Lower extremity edema 07/31/2018  .  Pressure injury of skin 07/24/2018  . Surgical site infection 07/23/2018  . Wound of left leg 07/23/2018  . Atherosclerosis of artery of extremity with rest pain ([place]) 07/08/2018  . Shortness of breath 06/02/2018  . Bruit 06/02/2018  . Coronary artery disease of native artery of native heart with stable angina pectoris ([place]) 06/02/2018  . Atherosclerosis of native arteries of extremity with intermittent claudication ([place]) 05/19/2018  . Peripheral arterial disease ([place]) 05/06/2018  . Personal history of colonic polyps   . Aortic stenosis 12/24/2017  . LVH (left ventricular hypertrophy) 12/24/2017  . Hypertension with heart disease 12/24/2017  . Left atrial dilatation 12/12/2017  . Cardiac murmur 12/12/2017  . Gastrojejunal ulcer 12/04/2017  . Hyperphosphatemia 12/04/2017  . Spinal stenosis of lumbar region 06/18/2017  . Degeneration of lumbar intervertebral disc 06/02/2017  . Assistance needed with transportation 05/26/2017  . Financial difficulties 05/26/2017  . Needs assistance with community resources 05/26/2017  . Moderate recurrent major depression ([place]) 05/26/2017  . Non-healing wound of lower extremity 02/06/2017  . Status post ankle arthrodesis 12/11/2016  . Controlled substance agreement broken 10/11/2016  . Low back pain 09/25/2016  . Osteoarthritis of right subtalar joint 07/11/2016  . Posterior tibial tendinitis of right lower extremity 07/11/2016  . Breast cancer screening 06/18/2016  . Knee pain 04/03/2016  . Hyponatremia 03/01/2016  . High triglycerides 12/24/2015  . Pes planus of both feet 11/16/2015  . Plantar fasciitis of right foot 11/16/2015  . Heel spur 11/16/2015  . Diabetic neuropathy ([place]) 11/16/2015  . Right ankle pain 11/16/2015  . Medication monitoring encounter 11/16/2015  . Chronic pain of multiple joints 08/15/2015  . Abnormal mammogram of right breast 06/19/2015  . Cerumen impaction 03/16/2015  . Hyperlipidemia 12/14/2014  . History of  transfusion of packed RBC 12/14/2014  . Hx of smoking 12/14/2014  . Status post bariatric surgery 12/14/2014  . Morbid obesity ([place]) 12/14/2014  . Chronic radicular lumbar pain 12/14/2014  . History of GI bleed 11/12/2014  . History of small bowel obstruction 09/07/2014    Sherrilyn Rist, SPT 05/04/19, 5:03 PM  Everlean Alstrom. [name], PT, DPT 05/04/19, 6:27 PM   [facility] [address] [place], [address] Phone: [phone]   Fax:  [phone]  Name: CAIRA POCHE MRN: [id number] Date of Birth: 1946/03/04

## 2019-05-06 ENCOUNTER — Other Ambulatory Visit
Admission: RE | Admit: 2019-05-06 | Discharge: 2019-05-06 | Disposition: A | Discharge status: Home / Self Care | Payer: Medicare Other | Source: Ambulatory Visit | Attending: Vascular Surgery | Admitting: Vascular Surgery

## 2019-05-06 ENCOUNTER — Ambulatory Visit: Payer: Medicare Other | Admitting: Physical Therapy

## 2019-05-06 ENCOUNTER — Encounter: Payer: Medicare Other | Admitting: Physical Therapy

## 2019-05-06 ENCOUNTER — Other Ambulatory Visit: Payer: Self-pay

## 2019-05-06 DIAGNOSIS — Z01812 Encounter for preprocedural laboratory examination: Secondary | ICD-10-CM | POA: Insufficient documentation

## 2019-05-06 DIAGNOSIS — Z20828 Contact with and (suspected) exposure to other viral communicable diseases: Secondary | ICD-10-CM | POA: Insufficient documentation

## 2019-05-06 LAB — SARS CORONAVIRUS 2 (TAT 6-24 HRS): SARS Coronavirus 2: NEGATIVE

## 2019-05-10 ENCOUNTER — Encounter
Admission: RE | Disposition: A | Discharge status: Home / Self Care | Payer: Self-pay | Source: Home / Self Care | Attending: Vascular Surgery

## 2019-05-10 ENCOUNTER — Other Ambulatory Visit: Payer: Self-pay

## 2019-05-10 ENCOUNTER — Encounter: Payer: Self-pay | Admitting: Emergency Medicine

## 2019-05-10 ENCOUNTER — Other Ambulatory Visit (INDEPENDENT_AMBULATORY_CARE_PROVIDER_SITE_OTHER): Payer: Self-pay | Admitting: Nurse Practitioner

## 2019-05-10 ENCOUNTER — Ambulatory Visit
Admission: RE | Admit: 2019-05-10 | Discharge: 2019-05-10 | Disposition: A | Discharge status: Home / Self Care | Payer: Medicare Other | Attending: Vascular Surgery | Admitting: Vascular Surgery

## 2019-05-10 DIAGNOSIS — Z87891 Personal history of nicotine dependence: Secondary | ICD-10-CM | POA: Insufficient documentation

## 2019-05-10 DIAGNOSIS — E1151 Type 2 diabetes mellitus with diabetic peripheral angiopathy without gangrene: Secondary | ICD-10-CM | POA: Insufficient documentation

## 2019-05-10 DIAGNOSIS — I10 Essential (primary) hypertension: Secondary | ICD-10-CM | POA: Insufficient documentation

## 2019-05-10 DIAGNOSIS — K219 Gastro-esophageal reflux disease without esophagitis: Secondary | ICD-10-CM | POA: Insufficient documentation

## 2019-05-10 DIAGNOSIS — I70219 Atherosclerosis of native arteries of extremities with intermittent claudication, unspecified extremity: Secondary | ICD-10-CM

## 2019-05-10 DIAGNOSIS — E1136 Type 2 diabetes mellitus with diabetic cataract: Secondary | ICD-10-CM | POA: Insufficient documentation

## 2019-05-10 DIAGNOSIS — E785 Hyperlipidemia, unspecified: Secondary | ICD-10-CM | POA: Diagnosis not present

## 2019-05-10 DIAGNOSIS — Z888 Allergy status to other drugs, medicaments and biological substances status: Secondary | ICD-10-CM | POA: Diagnosis not present

## 2019-05-10 DIAGNOSIS — E1140 Type 2 diabetes mellitus with diabetic neuropathy, unspecified: Secondary | ICD-10-CM | POA: Diagnosis not present

## 2019-05-10 DIAGNOSIS — I350 Nonrheumatic aortic (valve) stenosis: Secondary | ICD-10-CM | POA: Insufficient documentation

## 2019-05-10 DIAGNOSIS — I70222 Atherosclerosis of native arteries of extremities with rest pain, left leg: Secondary | ICD-10-CM

## 2019-05-10 DIAGNOSIS — J449 Chronic obstructive pulmonary disease, unspecified: Secondary | ICD-10-CM | POA: Insufficient documentation

## 2019-05-10 HISTORY — PX: LOWER EXTREMITY ANGIOGRAPHY: CATH118251

## 2019-05-10 LAB — CREATININE, SERUM
Creatinine, Ser: 0.78 mg/dL (ref 0.44–1.00)
GFR calc Af Amer: >60 mL/min (ref >60)
GFR calc non Af Amer: >60 mL/min (ref >60)

## 2019-05-10 LAB — GLUCOSE, CAPILLARY: Glucose-Capillary: 140 mg/dL — ABNORMAL HIGH (ref 70–99)

## 2019-05-10 LAB — BUN: BUN: 23 mg/dL (ref 8–23)

## 2019-05-10 SURGERY — LOWER EXTREMITY ANGIOGRAPHY
Anesthesia: Moderate Sedation | Laterality: Left

## 2019-05-10 MED ORDER — ONDANSETRON HCL 4 MG/2ML IJ SOLN: 4.0000 mg | Freq: Four times a day (QID) | INTRAMUSCULAR | Status: DC | PRN

## 2019-05-10 MED ORDER — MIDAZOLAM HCL 2 MG/ML PO SYRP: 8.0000 mg | ORAL_SOLUTION | Freq: Once | ORAL | Status: DC | PRN

## 2019-05-10 MED ORDER — SODIUM CHLORIDE 0.9 % IV SOLN: 250.0000 mL | INTRAVENOUS | Status: DC | PRN

## 2019-05-10 MED ORDER — ACETAMINOPHEN 325 MG PO TABS: 650.0000 mg | ORAL_TABLET | ORAL | Status: DC | PRN

## 2019-05-10 MED ORDER — DIPHENHYDRAMINE HCL 50 MG/ML IJ SOLN: 50.0000 mg | Freq: Once | INTRAMUSCULAR | Status: DC | PRN

## 2019-05-10 MED ORDER — SODIUM CHLORIDE 0.9% FLUSH: 3.0000 mL | INTRAVENOUS | Status: DC | PRN

## 2019-05-10 MED ORDER — IPRATROPIUM-ALBUTEROL 0.5-2.5 (3) MG/3ML IN SOLN
RESPIRATORY_TRACT | Status: AC
  Administered 2019-05-10: 3 mL via RESPIRATORY_TRACT
  Filled 2019-05-10: qty 3

## 2019-05-10 MED ORDER — METHYLPREDNISOLONE SODIUM SUCC 125 MG IJ SOLR: 125.0000 mg | Freq: Once | INTRAMUSCULAR | Status: DC | PRN

## 2019-05-10 MED ORDER — HYDRALAZINE HCL 20 MG/ML IJ SOLN: 5.0000 mg | INTRAMUSCULAR | Status: DC | PRN

## 2019-05-10 MED ORDER — MIDAZOLAM HCL 2 MG/2ML IJ SOLN
INTRAMUSCULAR | Status: DC | PRN
  Administered 2019-05-10: 1 mg via INTRAVENOUS
  Administered 2019-05-10: 2 mg via INTRAVENOUS

## 2019-05-10 MED ORDER — LABETALOL HCL 5 MG/ML IV SOLN: 10.0000 mg | INTRAVENOUS | Status: DC | PRN

## 2019-05-10 MED ORDER — SODIUM CHLORIDE 0.9 % IV SOLN
INTRAVENOUS | Status: DC
  Administered 2019-05-10: 09:00:00 via INTRAVENOUS

## 2019-05-10 MED ORDER — IODIXANOL 320 MG/ML IV SOLN
INTRAVENOUS | Status: DC | PRN
  Administered 2019-05-10: 09:00:00 110 mL via INTRA_ARTERIAL

## 2019-05-10 MED ORDER — HEPARIN SODIUM (PORCINE) 1000 UNIT/ML IJ SOLN
INTRAMUSCULAR | Status: DC | PRN
  Administered 2019-05-10: 5000 [IU] via INTRAVENOUS

## 2019-05-10 MED ORDER — FENTANYL CITRATE (PF) 100 MCG/2ML IJ SOLN
INTRAMUSCULAR | Status: DC | PRN
  Administered 2019-05-10 (×2): 50 ug via INTRAVENOUS

## 2019-05-10 MED ORDER — SODIUM CHLORIDE 0.9% FLUSH: 3.0000 mL | Freq: Two times a day (BID) | INTRAVENOUS | Status: DC

## 2019-05-10 MED ORDER — SODIUM CHLORIDE 0.9 % IV SOLN
INTRAVENOUS | Status: DC
  Administered 2019-05-10: 08:00:00 via INTRAVENOUS

## 2019-05-10 MED ORDER — FAMOTIDINE 20 MG PO TABS: 40.0000 mg | ORAL_TABLET | Freq: Once | ORAL | Status: DC | PRN

## 2019-05-10 MED ORDER — CEFAZOLIN SODIUM-DEXTROSE 2-4 GM/100ML-% IV SOLN
INTRAVENOUS | Status: AC
  Filled 2019-05-10: qty 100

## 2019-05-10 MED ORDER — FENTANYL CITRATE (PF) 100 MCG/2ML IJ SOLN
INTRAMUSCULAR | Status: AC
  Filled 2019-05-10: qty 2

## 2019-05-10 MED ORDER — HYDROMORPHONE HCL 1 MG/ML IJ SOLN: 1.0000 mg | Freq: Once | INTRAMUSCULAR | Status: DC | PRN

## 2019-05-10 MED ORDER — MIDAZOLAM HCL 5 MG/5ML IJ SOLN
INTRAMUSCULAR | Status: AC
  Filled 2019-05-10: qty 5

## 2019-05-10 MED ORDER — CEFAZOLIN SODIUM-DEXTROSE 2-4 GM/100ML-% IV SOLN
2.0000 g | Freq: Once | INTRAVENOUS | Status: AC
  Administered 2019-05-10: 2 g via INTRAVENOUS

## 2019-05-10 MED ORDER — IPRATROPIUM-ALBUTEROL 0.5-2.5 (3) MG/3ML IN SOLN
3.0000 mL | Freq: Once | RESPIRATORY_TRACT | Status: AC
  Administered 2019-05-10: 08:00:00 3 mL via RESPIRATORY_TRACT

## 2019-05-10 MED ORDER — HEPARIN SODIUM (PORCINE) 1000 UNIT/ML IJ SOLN
INTRAMUSCULAR | Status: AC
  Filled 2019-05-10: qty 1

## 2019-05-10 SURGICAL SUPPLY — 20 items
BALLN LUTONIX 5X150X130 (BALLOONS) ×3
BALLN ULTRVRSE 3X100X150 (BALLOONS) ×3
BALLN ULTRVRSE 5X100X75 (BALLOONS) ×3
BALLOON LUTONIX 5X150X130 (BALLOONS) ×1 IMPLANT
BALLOON ULTRVRSE 3X100X150 (BALLOONS) ×1 IMPLANT
BALLOON ULTRVRSE 5X100X75 (BALLOONS) ×1 IMPLANT
CANNULA 5F STIFF (CANNULA) ×3 IMPLANT
CATH BEACON 5 .038 100 VERT TP (CATHETERS) ×3 IMPLANT
CATH PIG 70CM (CATHETERS) ×3 IMPLANT
DEVICE PRESTO INFLATION (MISCELLANEOUS) ×3 IMPLANT
DEVICE STARCLOSE SE CLOSURE (Vascular Products) ×3 IMPLANT
GLIDEWIRE ADV .035X260CM (WIRE) ×3 IMPLANT
PACK ANGIOGRAPHY (CUSTOM PROCEDURE TRAY) ×3 IMPLANT
SHEATH ANL2 6FRX45 HC (SHEATH) ×3 IMPLANT
SHEATH BRITE TIP 5FRX11 (SHEATH) ×3 IMPLANT
STENT VIABAHN 6X100X120 (Permanent Stent) ×3 IMPLANT
SYR MEDRAD MARK 7 150ML (SYRINGE) ×3 IMPLANT
TUBING CONTRAST HIGH PRESS 72 (TUBING) ×3 IMPLANT
WIRE G V18X300CM (WIRE) ×3 IMPLANT
WIRE J 3MM .035X145CM (WIRE) ×3 IMPLANT

## 2019-05-10 NOTE — Op Note (Signed)
[facility]  Percutaneous Study/Intervention Procedural Note   Date of Surgery: 05/10/2019  Surgeon(s):Ola Raap    Assistants:none  Pre-operative Diagnosis: PAD with claudication and early rest pain left leg  Post-operative diagnosis:  Same  Procedure(s) Performed:             1.  Ultrasound guidance for vascular access right femoral artery             2.  Catheter placement into left common femoral artery from right femoral approach             3.  Aortogram and selective left lower extremity angiogram             4.  Percutaneous transluminal angioplasty of left tibioperoneal trunk and peroneal artery with 3 mm diameter angioplasty balloon             5.   Percutaneous transluminal angioplasty of the left proximal SFA with 5 mm diameter by 15 cm Lutonix drug-coated angioplasty balloon  6.  Viabahn stent placement to the proximal left SFA with 6 mm diameter by 10 cm length stent for greater than 50% residual stenosis after angioplasty             7.  StarClose closure device right femoral artery  EBL: 10 cc  Contrast: 110 cc  Fluoro Time: 7.6 minutes  Moderate Conscious Sedation Time: approximately 30 minutes using 3 mg of Versed and 100 mcg of Fentanyl              Indications:  Patient is a 73 y.o.female with disabling claudication symptoms and some rest pain symptoms of the left leg.  She has known extensive peripheral arterial disease and has undergone right lower extremity revascularization previously. The patient has noninvasive study showing mildly reduced right ABI but a significantly reduced left ABI of 0.6 range. The patient is brought in for angiography for further evaluation and potential treatment.  Due to the limb threatening nature of the situation, angiogram was performed for attempted limb salvage. The patient is aware that if the procedure fails, amputation would be expected.  The patient also understands that even with successful  revascularization, amputation may still be required due to the severity of the situation.  Risks and benefits are discussed and informed consent is obtained.   Procedure:  The patient was identified and appropriate procedural time out was performed.  The patient was then placed supine on the table and prepped and draped in the usual sterile fashion. Moderate conscious sedation was administered during a face to face encounter with the patient throughout the procedure with my supervision of the RN administering medicines and monitoring the patient's vital signs, pulse oximetry, telemetry and mental status throughout from the start of the procedure until the patient was taken to the recovery room. Ultrasound was used to evaluate the right common femoral artery.  It was patent .  A digital ultrasound image was acquired.  A micropuncture needle was used to access the right common femoral artery under direct ultrasound guidance and a permanent image was performed.   A micropuncture wire and sheath were then placed.  A 0.035 J wire was advanced without resistance and a 5Fr sheath was placed.  Pigtail catheter was placed into the aorta and an AP aortogram was performed. Image quality was somewhat limited due to patient body habitus and the device, but the renal arteries appear to have good flow and no obvious stenosis was seen.  The aorta  itself was patent.  There appeared to be mild disease in the iliac vessels most notably in the right external iliac artery that was approaching 50% likely not over 50%.  The left iliac vessels appeared to have mild disease.  I then crossed the aortic bifurcation and advanced to the left femoral head. Selective left lower extremity angiogram was then performed. This demonstrated severely diseased proximal SFA with a relatively high femoral bifurcation.  The SFA stenosis was in the 80% range at the most proximal SFA, and normalized for several centimeters, and then was in the 90% range  just above the previously placed stent in the SFA.  There also was at least moderate stenosis in the profunda femoris artery that appeared to be in the 60 to 70% range.  The previously placed stents in the SFA had good flow in the popliteal artery had no significant stenosis.  There was a typical tibial trifurcation with an anterior tibial artery which was continuous but diseased in the foot.  The tibioperoneal trunk had about a 70 to 75% stenosis in the proximal peroneal artery had about a 60% stenosis but this was then continuous distally and filled the posterior tibial artery through collaterals at the ankle and foot.  The posterior tibial artery had a long segment occlusion in the proximal, mid, to distal segment. It was felt that it was in the patient's best interest to proceed with intervention after these images to avoid a second procedure and a larger amount of contrast and fluoroscopy based off of the findings from the initial angiogram. The patient was systemically heparinized and a 6 [place] [employer]sell sheath was then placed over the [employer] wire. I then used a Kumpe catheter and the advantage wire to navigate through the SFA stenosis and down into the popliteal artery where imaging was performed to help visualize the tibial vessels better.  I then used this image to cross the tibioperoneal trunk and peroneal stenosis with a V 18 wire and parked the wire in the distal lower leg.  I then proceeded with treatment.  A 3 mm diameter by 10 cm length angioplasty balloon was inflated to 10 atm for 1 minute and the tibioperoneal trunk and the most proximal peroneal artery.  Completion imaging showed marked improvement with only about a 10 to 15% residual stenosis in both locations.  I then turned my attention to the proximal SFA.  A 5 mm diameter by 15 cm Lutonix drug-coated angioplasty balloon was inflated to 14 atm for 1 minute.  Completion imaging following this showed greater than 50% residual stenosis  particularly in the second of the 2 stenoses closer to the previously placed stents.  A 6 mm diameter by 10 cm length Viabahn stent was then deployed about 1 to 2 cm below the origin of the SFA going just into the previously placed stents in the proximal to mid SFA.  This was postdilated with a 5 mm balloon with excellent angiographic completion result and less than 10% residual stenosis. I elected to terminate the procedure. The sheath was removed and StarClose closure device was deployed in the right femoral artery with excellent hemostatic result. The patient was taken to the recovery room in stable condition having tolerated the procedure well.  Findings:               Aortogram:  Image quality was somewhat limited due to patient body habitus and the device, but the renal arteries appear to have good flow and no obvious  stenosis was seen.  The aorta itself was patent.  There appeared to be mild disease in the iliac vessels most notably in the right external iliac artery that was approaching 50% likely not over 50%.  The left iliac vessels appeared to have mild disease.             Left lower Extremity:  This demonstrated severely diseased proximal SFA with a relatively high femoral bifurcation.  The SFA stenosis was in the 80% range at the most proximal SFA, and normalized for several centimeters, and then was in the 90% range just above the previously placed stent in the SFA.  There also was at least moderate stenosis in the profunda femoris artery that appeared to be in the 60 to 70% range.  The previously placed stents in the SFA had good flow in the popliteal artery had no significant stenosis.  There was a typical tibial trifurcation with an anterior tibial artery which was continuous but diseased in the foot.  The tibioperoneal trunk had about a 70 to 75% stenosis in the proximal peroneal artery had about a 60% stenosis but this was then continuous distally and filled the posterior tibial artery  through collaterals at the ankle and foot.  The posterior tibial artery had a long segment occlusion in the proximal, mid, to distal segment.   Disposition: Patient was taken to the recovery room in stable condition having tolerated the procedure well.  Complications: None  Leotis Pain 05/10/2019 8:57 AM   This note was created with Dragon Medical transcription system. Any errors in dictation are purely unintentional.

## 2019-05-10 NOTE — Discharge Instructions (Signed)
Angiogram, Care After °This sheet gives you information about how to care for yourself after your procedure. Your doctor may also give you more specific instructions. If you have problems or questions, contact your doctor. °Follow these instructions at home: °Insertion site care °· Follow instructions from your doctor about how to take care of your long, thin tube (catheter) insertion area. Make sure you: °? Wash your hands with soap and water before you change your bandage (dressing). If you cannot use soap and water, use hand sanitizer. °? Change your bandage as told by your doctor. °? Leave stitches (sutures), skin glue, or skin tape (adhesive) strips in place. They may need to stay in place for 2 weeks or longer. If tape strips get loose and curl up, you may trim the loose edges. Do not remove tape strips completely unless your doctor says it is okay. °· Do not take baths, swim, or use a hot tub until your doctor says it is okay. °· You may shower 24-48 hours after the procedure or as told by your doctor. °? Gently wash the area with plain soap and water. °? Pat the area dry with a clean towel. °? Do not rub the area. This may cause bleeding. °· Do not apply powder or lotion to the area. Keep the area clean and dry. °· Check your insertion area every day for signs of infection. Check for: °? More redness, swelling, or pain. °? Fluid or blood. °? Warmth. °? Pus or a bad smell. °Activity °· Rest as told by your doctor, usually for 1-2 days. °· Do not lift anything that is heavier than 10 lbs. (4.5 kg) or as told by your doctor. °· Do not drive for 24 hours if you were given a medicine to help you relax (sedative). °· Do not drive or use heavy machinery while taking prescription pain medicine. °General instructions ° °· Go back to your normal activities as told by your doctor, usually in about a week. Ask your doctor what activities are safe for you. °· If the insertion area starts to bleed, lie flat and put  pressure on the area. If the bleeding does not stop, get help right away. This is an emergency. °· Drink enough fluid to keep your pee (urine) clear or pale yellow. °· Take over-the-counter and prescription medicines only as told by your doctor. °· Keep all follow-up visits as told by your doctor. This is important. °Contact a doctor if: °· You have a fever. °· You have chills. °· You have more redness, swelling, or pain around your insertion area. °· You have fluid or blood coming from your insertion area. °· The insertion area feels warm to the touch. °· You have pus or a bad smell coming from your insertion area. °· You have more bruising around the insertion area. °· Blood collects in the tissue around the insertion area (hematoma) that may be painful to the touch. °Get help right away if: °· You have a lot of pain in the insertion area. °· The insertion area swells very fast. °· The insertion area is bleeding, and the bleeding does not stop after holding steady pressure on the area. °· The area near or just beyond the insertion area becomes pale, cool, tingly, or numb. °These symptoms may be an emergency. Do not wait to see if the symptoms will go away. Get medical help right away. Call your local emergency services (911 in the [place] Do not drive yourself to the hospital. °  Summary °· After the procedure, it is common to have bruising and tenderness at the long, thin tube insertion area. °· After the procedure, it is important to rest and drink plenty of fluids. °· Do not take baths, swim, or use a hot tub until your doctor says it is okay to do so. You may shower 24-48 hours after the procedure or as told by your doctor. °· If the insertion area starts to bleed, lie flat and put pressure on the area. If the bleeding does not stop, get help right away. This is an emergency. °This information is not intended to replace advice given to you by your health care provider. Make sure you discuss any questions you have  with your health care provider. °Document Released: 09/13/2008 Document Revised: 05/30/2017 Document Reviewed: 06/11/2016 °Elsevier Patient Education © 2020 Else[employer] Inc[employer] ° ° ° °Femoral Site Care °This sheet gives you information about how to care for yourself after your procedure. Your health care provider may also give you more specific instructions. If you have problems or questions, contact your health care provider. °What can I expect after the procedure? °After the procedure, it is common to have: °· Bruising that usually fades within 1-2 weeks. °· Tenderness at the site. °Follow these instructions at home: °Wound care °· Follow instructions from your health care provider about how to take care of your insertion site. Make sure you: °? Wash your hands with soap and water before you change your bandage (dressing). If soap and water are not available, use hand sanitizer. °? Change your dressing as told by your health care provider. °? Leave stitches (sutures), skin glue, or adhesive strips in place. These skin closures may need to stay in place for 2 weeks or longer. If adhesive strip edges start to loosen and curl up, you may trim the loose edges. Do not remove adhesive strips completely unless your health care provider tells you to do that. °· Do not take baths, swim, or use a hot tub until your health care provider approves. °· You may shower 24-48 hours after the procedure or as told by your health care provider. °? Gently wash the site with plain soap and water. °? Pat the area dry with a clean towel. °? Do not rub the site. This may cause bleeding. °· Do not apply powder or lotion to the site. Keep the site clean and dry. °· Check your femoral site every day for signs of infection. Check for: °? Redness, swelling, or pain. °? Fluid or blood. °? Warmth. °? Pus or a bad smell. °Activity °· For the first 2-3 days after your procedure, or as long as directed: °? Avoid climbing stairs as much as possible. °? Do  not squat. °· Do not lift anything that is heavier than 10 lb (4.5 kg), or the limit that you are told, until your health care provider says that it is safe. °· Rest as directed. °? Avoid sitting for a long time without moving. Get up to take short walks every 1-2 hours. °· Do not drive for 24 hours if you were given a medicine to help you relax (sedative). °General instructions °· Take over-the-counter and prescription medicines only as told by your health care provider. °· Keep all follow-up visits as told by your health care provider. This is important. °Contact a health care provider if you have: °· A fever or chills. °· You have redness, swelling, or pain around your insertion site. °Get help right away   if: °· The catheter insertion area swells very fast. °· You pass out. °· You suddenly start to sweat or your skin gets clammy. °· The catheter insertion area is bleeding, and the bleeding does not stop when you hold steady pressure on the area. °· The area near or just beyond the catheter insertion site becomes pale, cool, tingly, or numb. °These symptoms may represent a serious problem that is an emergency. Do not wait to see if the symptoms will go away. Get medical help right away. Call your local emergency services (911 in the [place] Do not drive yourself to the hospital. °Summary °· After the procedure, it is common to have bruising that usually fades within 1-2 weeks. °· Check your femoral site every day for signs of infection. °· Do not lift anything that is heavier than 10 lb (4.5 kg), or the limit that you are told, until your health care provider says that it is safe. °This information is not intended to replace advice given to you by your health care provider. Make sure you discuss any questions you have with your health care provider. °Document Released: 02/18/2014 Document Revised: 06/30/2017 Document Reviewed: 06/30/2017 °Elsevier Patient Education © 2020 Else[employer] Inc[employer] ° ° °Moderate Conscious  Sedation, Adult, Care After °These instructions provide you with information about caring for yourself after your procedure. Your health care provider may also give you more specific instructions. Your treatment has been planned according to current medical practices, but problems sometimes occur. Call your health care provider if you have any problems or questions after your procedure. °What can I expect after the procedure? °After your procedure, it is common: °· To feel sleepy for several hours. °· To feel clumsy and have poor balance for several hours. °· To have poor judgment for several hours. °· To vomit if you eat too soon. °Follow these instructions at home: °For at least 24 hours after the procedure: ° °· Do not: °? Participate in activities where you could fall or become injured. °? Drive. °? Use heavy machinery. °? Drink alcohol. °? Take sleeping pills or medicines that cause drowsiness. °? Make important decisions or sign legal documents. °? Take care of children on your own. °· Rest. °Eating and drinking °· Follow the diet recommended by your health care provider. °· If you vomit: °? Drink water, juice, or soup when you can drink without vomiting. °? Make sure you have little or no nausea before eating solid foods. °General instructions °· Have a responsible adult stay with you until you are awake and alert. °· Take over-the-counter and prescription medicines only as told by your health care provider. °· If you smoke, do not smoke without supervision. °· Keep all follow-up visits as told by your health care provider. This is important. °Contact a health care provider if: °· You keep feeling nauseous or you keep vomiting. °· You feel light-headed. °· You develop a rash. °· You have a fever. °Get help right away if: °· You have trouble breathing. °This information is not intended to replace advice given to you by your health care provider. Make sure you discuss any questions you have with your health  care provider. °Document Released: 04/07/2013 Document Revised: 05/30/2017 Document Reviewed: 10/07/2015 °Elsevier Patient Education © 2020 [employer] ° °

## 2019-05-10 NOTE — H&P (Signed)
[facility] Admission History & Physical  MRN : [id number]  Andrea MangleDianna J Holden is a 73 y.o. (18-Jan-1946) female who presents with chief complaint of No chief complaint on file. Marland Kitchen.  History of Present Illness: Patient presents today for left lower extremity angiogram for severe claudication and early rest pain symptoms.  She has had known significant left lower extremity disease for some time, and had to have a right femoral endarterectomy and extensive revascularization on the right side prior to any intervention on the left.  She was very slow in healing her wound and she has multiple other comorbidities which have slowed her recovery process.  She is near her baseline today.  She was recently seen in our office with noninvasive studies. ABI's Rt=0.82 and Lt=0.60 (previous ABI's Rt=0.89 and Lt=0.70) Duplex US of the lower extremity arterial system shows biphasic waveforms throughout the right lower extremity except for monophasic at the distal posterior tibial artery.  The left lower extremity has monophasic flow throughout suggesting inflow disease.  There are increased velocities at the common femoral artery.  Current Facility-Administered Medications  Medication Dose Route Frequency Provider Last Rate Last Dose  . 0.9 %  sodium chloride infusion   Intravenous Continuous Georgiana SpinnerBrown, Fallon E, NP 75 mL/hr at 05/10/19 0735    . ceFAZolin (ANCEF) 2-4 GM/100ML-% IVPB           . ceFAZolin (ANCEF) IVPB 2g/100 mL premix  2 g Intravenous Once Sheppard PlumberBrown, Fallon E, NP      . diphenhydrAMINE (BENADRYL) injection 50 mg  50 mg Intravenous Once PRN Georgiana SpinnerBrown, Fallon E, NP      . famotidine (PEPCID) tablet 40 mg  40 mg Oral Once PRN Georgiana SpinnerBrown, Fallon E, NP      . fentaNYL (SUBLIMAZE) 100 MCG/2ML injection           . heparin 1000 UNIT/ML injection           . HYDROmorphone (DILAUDID) injection 1 mg  1 mg Intravenous Once PRN Sheppard PlumberBrown, Fallon E, NP      . methylPREDNISolone sodium succinate  (SOLU-MEDROL) 125 mg/2 mL injection 125 mg  125 mg Intravenous Once PRN Sheppard PlumberBrown, Fallon E, NP      . midazolam (VERSED) 2 MG/ML syrup 8 mg  8 mg Oral Once PRN Georgiana SpinnerBrown, Fallon E, NP      . midazolam (VERSED) 5 MG/5ML injection           . ondansetron (ZOFRAN) injection 4 mg  4 mg Intravenous Q6H PRN Georgiana SpinnerBrown, Fallon E, NP        Past Medical History:  Diagnosis Date  . Allergy    seasonal allergies  . Anemia 2014   needed 5 units of blood d/t passing out, weak  . Anxiety   . Aortic stenosis 12/24/2017   Echo Aug 2018  . Arthritis   . Asthma    allergy induced asthma  . Cardiac murmur 12/12/2017  . Cataract   . Cataract    left  . Complication of anesthesia    arrhythmia following colonoscopy  . Degenerative disc disease, lumbar   . Degenerative disc disease, lumbar   . Depression   . Diabetes mellitus   . Diabetic neuropathy (HCC) 11/16/2015  . Dysrhythmia    stenosis of left ventricle  . GERD (gastroesophageal reflux disease)   . H/O transfusion    [facility]  . History of chicken  pox   . History of hiatal hernia   . History of measles, mumps, or rubella   . HOH (hard of hearing)    does not use hearing aides yet  . Hyperlipidemia   . Hypertension   . Irregular heartbeat   . LVH (left ventricular hypertrophy) 12/24/2017   Echo Aug 2018  . Neuropathy   . Opiate use 11/16/2015  . Peripheral arterial disease (HCC) 05/06/2018   At rest, left > right; refer to vasc  . Pes planus of both feet 11/16/2015  . Plantar fasciitis of right foot 11/16/2015  . Wheezing     Past Surgical History:  Procedure Laterality Date  . ABDOMINAL HYSTERECTOMY    . APPLICATION OF WOUND VAC Left 07/23/2018   Procedure: APPLICATION OF WOUND VAC;  Surgeon: Annice Needy, MD;  Location: [facility] ORS;  Service: Vascular;  Laterality: Left;  . banck injections    . CATARACT EXTRACTION W/PHACO Right 05/30/2015   Procedure: CATARACT EXTRACTION PHACO AND  INTRAOCULAR LENS PLACEMENT (IOC);  Surgeon: Galen Manila, MD;  Location: [facility] OR[facility];  Service: Ophthalmology;  Laterality: Right;  [place] 00:57 AP% 20.9 CDE 11.99 fluid pack lot #[id number]  . CHOLECYSTECTOMY  1970  . COLON SURGERY  2013   blocked colon  . COLONOSCOPY WITH PROPOFOL N/A 01/20/2018   Procedure: COLONOSCOPY WITH PROPOFOL;  Surgeon: Midge Minium, MD;  Location: [facility];  Service: Endoscopy;  Laterality: N/A;  . ENDARTERECTOMY FEMORAL Left 07/08/2018   Procedure: ENDARTERECTOMY FEMORAL;  Surgeon: Annice Needy, MD;  Location: [facility] ORS;  Service: Vascular;  Laterality: Left;  . EYE SURGERY Right    cataract surgery  . FOOT FUSION Right 2018   metal in foot  . GALLBLADDER SURGERY  1970  . GASTRIC BYPASS  2010   lost 178 lbs and regained 40 lbs last few years  . HUMERUS FRACTURE SURGERY Right    metal plate with screws  . internal bleeding  2016   ulcer in past  . LOWER EXTREMITY ANGIOGRAM Left 07/08/2018   Procedure: LOWER EXTREMITY ANGIOGRAM;  Surgeon: Annice Needy, MD;  Location: [facility];  Service: Vascular;  Laterality: Left;  . LOWER EXTREMITY ANGIOGRAPHY Left 05/27/2018   Procedure: LOWER EXTREMITY ANGIOGRAPHY;  Surgeon: Annice Needy, MD;  Location: [facility];  Service: Cardiovascular;  Laterality: Left;  . LOWER EXTREMITY ANGIOGRAPHY Right 12/07/2018   Procedure: LOWER EXTREMITY ANGIOGRAPHY;  Surgeon: Annice Needy, MD;  Location: [facility];  Service: Cardiovascular;  Laterality: Right;  . MOUTH SURGERY     root canals and crowns and extractions  . OVARY SURGERY    . SKIN GRAFT Right 2018   RT foot. foot has been rebuilt.  it is full of metal  . TENOTOMY ACHILLES TENDON Right    Percuntaneous. metal in foot  . TONSILLECTOMY    . WOUND DEBRIDEMENT Left 07/23/2018   Procedure: DEBRIDEMENT WOUND;  Surgeon: Annice Needy, MD;  Location: [facility];  Service: Vascular;  Laterality: Left;    Social History Social History   Tobacco Use  . Smoking  status: Current Some Day Smoker    Packs/day: 0.50    Years: 55.00    Pack years: 27.50    Types: Cigarettes    Start date: 07/01/1960    Last attempt to quit: 11/22/2018    Years since quitting: 0.4  . Smokeless tobacco: Never Used  . Tobacco comment: trying to quit again, using chantix  Substance Use Topics  .  Alcohol use: Yes    Alcohol/week: 0.0 standard drinks    Comment: 2 drinks per month  . Drug use: Not Currently    Types: Marijuana    Comment: used for pain    Family History Family History  Problem Relation Age of Onset  . Asthma Mother   . COPD Mother   . Arthritis Father   . Depression Father   . Heart disease Father   . Hypertension Father   . Stroke Father   . Heart attack Father   . Arthritis Brother   . Depression Brother   . Diabetes Brother   . Heart disease Brother   . Hyperlipidemia Brother   . Hypertension Brother   . Stroke Brother   . Vision loss Brother   . Heart attack Brother   . Diabetes Maternal Grandmother   . Thyroid disease Daughter   . Colitis Daughter   . Breast cancer Neg Hx     Allergies  Allergen Reactions  . Meloxicam Other (See Comments)    GI bleeding  . Nsaids Other (See Comments)    Gi bleeding  . Sitagliptin Hives and Itching    Januvia      REVIEW OF SYSTEMS (Negative unless checked)  Constitutional: [] Weight loss  [] Fever  [] Chills Cardiac: [] Chest pain   [] Chest pressure   [x] Palpitations   [] Shortness of breath when laying flat   [] Shortness of breath at rest   [x] Shortness of breath with exertion. Vascular:  [x] Pain in legs with walking   [x] Pain in legs at rest   [] Pain in legs when laying flat   [] Claudication   [] Pain in feet when walking  [x] Pain in feet at rest  [] Pain in feet when laying flat   [] History of DVT   [] Phlebitis   [x] Swelling in legs   [] Varicose veins   [] Non-healing ulcers Pulmonary:   [] Uses home oxygen   [x] Productive cough   [] Hemoptysis   [] Wheeze  [x] COPD   [x] Asthma Neurologic:   [x] Dizziness  [] Blackouts   [] Seizures   [] History of stroke   [] History of TIA  [] Aphasia   [] Temporary blindness   [] Dysphagia   [] Weakness or numbness in arms   [] Weakness or numbness in legs X positive for memory and cognitive issues Musculoskeletal:  [x] Arthritis   [] Joint swelling   [x] Joint pain   [] Low back pain Hematologic:  [] Easy bruising  [] Easy bleeding   [] Hypercoagulable state   [x] Anemic  [] Hepatitis Gastrointestinal:  [] Blood in stool   [] Vomiting blood  [x] Gastroesophageal reflux/heartburn   [] Difficulty swallowing. Genitourinary:  [] Chronic kidney disease   [] Difficult urination  [] Frequent urination  [] Burning with urination   [] Blood in urine Skin:  [] Rashes   [] Ulcers   [] Wounds Psychological:  [] History of anxiety   [x]  History of major depression.  Physical Examination  Vitals:   05/10/19 0723  BP: (!) 177/65  Pulse: 70  Resp: 15  Temp: 98.5 F (36.9 C)  TempSrc: Oral  SpO2: 96%  Weight: 93 kg  Height: 5\' 1"  (1.549 m)   Body mass index is 38.73 kg/m. Gen: WD/WN, NAD Head: [place]/AT, No temporalis wasting.  Ear/Nose/Throat: Hearing grossly intact, nares w/o erythema or drainage, oropharynx w/o Erythema/Exudate,  Eyes: Conjunctiva clear, sclera non-icteric Neck: Trachea midline.  No JVD.  Pulmonary:  Good air movement, respirations not labored, no use of accessory muscles.  Cardiac: RRR, normal S1, S2. Vascular:  Vessel Right Left  Radial Palpable Palpable  PT 1+ Palpable Not Palpable  DP 2+ Palpable 1+ Palpable   Gastrointestinal: soft, non-tender/non-distended. No guarding/reflex.  Musculoskeletal: M/S 5/5 throughout.  Extremities without ischemic changes.  No deformity or atrophy.  Neurologic: Sensation grossly intact in extremities.  Symmetrical.  Speech is fluent. Motor exam as listed above. Psychiatric: Judgment and insight are fair at best Dermatologic: No rashes or ulcers noted.  No cellulitis or open  wounds.      CBC Lab Results  Component Value Date   WBC 11.3 (H) 07/24/2018   HGB 10.1 (L) 07/24/2018   HCT 32.0 (L) 07/24/2018   MCV 86.5 07/24/2018   PLT 289 07/24/2018    BMET    Component Value Date/Time   NA 139 12/18/2018 1407   NA 133 (L) 03/07/2016 1317   NA 135 10/12/2014 0516   K 4.9 12/18/2018 1407   K 4.3 10/12/2014 0516   CL 103 12/18/2018 1407   CL 109 10/12/2014 0516   CO2 25 12/18/2018 1407   CO2 24 10/12/2014 0516   GLUCOSE 135 (H) 12/18/2018 1407   GLUCOSE 146 (H) 10/12/2014 0516   BUN 23 05/10/2019 0726   BUN 18 03/07/2016 1317   BUN 39 (H) 10/12/2014 0516   CREATININE 0.78 05/10/2019 0726   CREATININE 0.97 (H) 12/18/2018 1407   CALCIUM 9.3 12/18/2018 1407   CALCIUM 7.6 (L) 10/12/2014 0516   GFRNONAA >60 05/10/2019 0726   GFRNONAA 59 (L) 12/18/2018 1407   GFRAA >60 05/10/2019 0726   GFRAA 68 12/18/2018 1407   Estimated Creatinine Clearance: 66.1 mL/min (by C-G formula based on SCr of 0.78 mg/dL).  COAG Lab Results  Component Value Date   INR 1.03 07/23/2018   INR 0.91 06/29/2018    Radiology No results found.   Assessment/Plan 1.  PAD with claudication and early rest pain symptoms.  She has severe disease and is already undergone extensive right lower extremity revascularization.  Her right groin wound was very slow to heal delaying her intervention on the left leg for several months.  Risks and benefits of the procedure were discussed with the patient in detail and she is agreeable to proceed. 2.  Diabetes.  Stable on outpatient medication and blood glucose control important in reducing the progression of atherosclerotic disease. Also, involved in wound healing. On appropriate medications. 3. Hypertension. Stable on outpatient medications and blood pressure control important in reducing the progression of atherosclerotic disease. On appropriate oral medications. 4. Asthma/COPD. Continue pulmonary medications and aerosols as already  ordered, these medications have been reviewed and there are no changes at this time. Did not do her albuterol this am so we have given her a treatment here.     Festus Barren, MD  05/10/2019 8:04 AM

## 2019-05-11 ENCOUNTER — Encounter: Payer: Medicare Other | Admitting: Physical Therapy

## 2019-05-12 ENCOUNTER — Encounter: Payer: Self-pay | Admitting: Physical Therapy

## 2019-05-12 ENCOUNTER — Ambulatory Visit: Payer: Medicare Other | Admitting: Physical Therapy

## 2019-05-12 ENCOUNTER — Other Ambulatory Visit: Payer: Self-pay

## 2019-05-12 DIAGNOSIS — M6281 Muscle weakness (generalized): Secondary | ICD-10-CM | POA: Diagnosis not present

## 2019-05-12 DIAGNOSIS — R262 Difficulty in walking, not elsewhere classified: Secondary | ICD-10-CM

## 2019-05-12 DIAGNOSIS — G8929 Other chronic pain: Secondary | ICD-10-CM

## 2019-05-12 DIAGNOSIS — Z9181 History of falling: Secondary | ICD-10-CM

## 2019-05-12 NOTE — Therapy (Signed)
[facility] [address] [place], [address] Phone: [phone]   Fax:  [phone]  Physical Therapy Treatment  Patient Details  Name: Andrea Holden MRN: [id number] Date of Birth: [birth year] Referring Provider (PT): P[place], Bethel Born, NP  (Simultaneous filing. User may not have seen previous data.)   Encounter Date: 05/12/2019  PT End of Session - 05/12/19 1313    Visit Number  37    Number of Visits  34    Date for PT Re-Evaluation  06/01/19    Authorization Type  [facility] Medicare reporting period from  03/23/2019    Authorization - Visit Number  2    Authorization - Number of Visits  10    PT Start Time  1307    PT Stop Time  4166    PT Time Calculation (min)  40 min    Equipment Utilized During Treatment  Gait belt    Activity Tolerance  Patient limited by pain;Patient tolerated treatment well;Patient limited by fatigue    Behavior During Therapy  [place] for tasks assessed/performed       Past Medical History:  Diagnosis Date  . Allergy    seasonal allergies  . Anemia 2014   needed 5 units of blood d/t passing out, weak  . Anxiety   . Aortic stenosis 12/24/2017   Echo Aug 2018  . Arthritis   . Asthma    allergy induced asthma  . Cardiac murmur 12/12/2017  . Cataract   . Cataract    left  . Complication of anesthesia    arrhythmia following colonoscopy  . Degenerative disc disease, lumbar   . Degenerative disc disease, lumbar   . Depression   . Diabetes mellitus   . Diabetic neuropathy ([place]) 11/16/2015  . Dysrhythmia    stenosis of left ventricle  . GERD (gastroesophageal reflux disease)   . H/O transfusion    patient was given 5 units of blood while at [place], blood type O+  . History of chicken pox   . History of hiatal hernia   . History of measles, mumps, or rubella   . HOH (hard of hearing)    does not use hearing aides yet  . Hyperlipidemia   . Hypertension   . Irregular  heartbeat   . LVH (left ventricular hypertrophy) 12/24/2017   Echo Aug 2018  . Neuropathy   . Opiate use 11/16/2015  . Peripheral arterial disease ([place]) 05/06/2018   At rest, left > right; refer to vasc  . Pes planus of both feet 11/16/2015  . Plantar fasciitis of right foot 11/16/2015  . Wheezing     Past Surgical History:  Procedure Laterality Date  . ABDOMINAL HYSTERECTOMY    . APPLICATION OF WOUND VAC Left 07/23/2018   Procedure: APPLICATION OF WOUND VAC;  Surgeon: Algernon Huxley, MD;  Location: [facility] OR[facility];  Service: Vascular;  Laterality: Left;  . banck injections    . CATARACT EXTRACTION W/PHACO Right 05/30/2015   Procedure: CATARACT EXTRACTION PHACO AND INTRAOCULAR LENS PLACEMENT (IOC);  Surgeon: Birder Robson, MD;  Location: [facility];  Service: Ophthalmology;  Laterality: Right;  [place] 00:57 AP% 20.9 CDE 11.99 fluid pack lot #[id number]  . CHOLECYSTECTOMY  1970  . COLON SURGERY  2013   blocked colon  . COLONOSCOPY WITH PROPOFOL N/A 01/20/2018   Procedure: COLONOSCOPY WITH PROPOFOL;  Surgeon: Lucilla Lame, MD;  Location: [facility];  Service: Endoscopy;  Laterality: N/A;  . ENDARTERECTOMY  FEMORAL Left 07/08/2018   Procedure: ENDARTERECTOMY FEMORAL;  Surgeon: Algernon Huxley, MD;  Location: [facility];  Service: Vascular;  Laterality: Left;  . EYE SURGERY Right    cataract surgery  . FOOT FUSION Right 2018   metal in foot  . GALLBLADDER SURGERY  1970  . GASTRIC BYPASS  2010   lost 178 lbs and regained 40 lbs last few years  . HUMERUS FRACTURE SURGERY Right    metal plate with screws  . internal bleeding  2016   ulcer in past  . LOWER EXTREMITY ANGIOGRAM Left 07/08/2018   Procedure: LOWER EXTREMITY ANGIOGRAM;  Surgeon: Algernon Huxley, MD;  Location: [facility];  Service: Vascular;  Laterality: Left;  . LOWER EXTREMITY ANGIOGRAPHY Left 05/27/2018   Procedure: LOWER EXTREMITY ANGIOGRAPHY;  Surgeon: Algernon Huxley, MD;  Location: [facility];  Service: Cardiovascular;  Laterality:  Left;  . LOWER EXTREMITY ANGIOGRAPHY Right 12/07/2018   Procedure: LOWER EXTREMITY ANGIOGRAPHY;  Surgeon: Algernon Huxley, MD;  Location: [facility];  Service: Cardiovascular;  Laterality: Right;  . LOWER EXTREMITY ANGIOGRAPHY Left 05/10/2019   Procedure: LOWER EXTREMITY ANGIOGRAPHY;  Surgeon: Algernon Huxley, MD;  Location: [facility];  Service: Cardiovascular;  Laterality: Left;  . MOUTH SURGERY     root canals and crowns and extractions  . OVARY SURGERY    . SKIN GRAFT Right 2018   RT foot. foot has been rebuilt.  it is full of metal  . TENOTOMY ACHILLES TENDON Right    Percuntaneous. metal in foot  . TONSILLECTOMY    . WOUND DEBRIDEMENT Left 07/23/2018   Procedure: DEBRIDEMENT WOUND;  Surgeon: Algernon Huxley, MD;  Location: [facility];  Service: Vascular;  Laterality: Left;    There were no vitals filed for this visit.  Subjective Assessment - 05/12/19 1310    Subjective  Patient reports 4/10 pain at her lower back today. Patient also have soreness at bilateral hips and near her procedure at lower abdominal areas.    Pertinent History  Patient is a 73 y.o. female who presents to outpatient physical therapy with a referral for medical diagnosis of degeneration of lumbar intervertebral disc. This patient's chief complaints consist of chronic low back pain and foot pain, weakness, stiffness, leading to the following functional deficits: difficulty with with basic ADLs, IADLs, ambulation. She used to get injections to help control back pain but it has recently worsened due to being unable to get injections during [place] pandemic. The patient has been informed of current processes in place at [facility] to protect patients from Covid-19 exposure including social distancing, schedule modifications, and new cleaning procedures. After discussing their particular risk with a therapist based on the patient's personal risk factors, the patient has decided to proceed with in-person therapy.  Pt is scheduled for R LE angiography on 12/07/2018. She has recently started smoking again with the stress of COVID19 pandemic and feels strongly she needs to stop. Surgeon has stated in the past he will not complete surgery if she is smoking.  Relevant past medical history and comorbidities include underwent extensive vascular surgeries in Jan 2020 to improve blood flow, similar surgery for R leg scheduled 12/07/2018. Has a history of left carotid artery stenosis, but not enough to have surgery, has a history of multiple arterial surgeries, history of R cataract surgery, colon surgery, cholecystectomy, R foot skin graft, ovary surgery, gastric bypass, R foot fusion, abdominal hysterectomy; current smoker, plantar fasciitis, peripheral arterial  disease, LVH, diabetes mellitus with neuropathy, irregular heartbeat, hiatal hernia, GERD, stenosis of left heart ventricle, depression, lumbar degenerative disc disease, cardiac murmur, asthma, anxiety, obesity, HTN, and seasonal allergies She has been advised not to lift over 10 pounds by physician    Limitations  Sitting;Standing;Walking;House hold activities;Lifting    How long can you sit comfortably?  > 1 hour in almost any chair    How long can you stand comfortably?  4-5 minutes    How long can you walk comfortably?  2 minutes, depends on what she has with her. Able to walk at fair with rollator. To mailbox 120 feet causes shaking in R leg.      Diagnostic tests  MRI lumbar spine report 06/14/2017: "IMPRESSION: 1. Stable degenerative of lumbar spondylosis and scoliosis with multilevel disc disease and facet disease. 2. Stable right foraminal stenosis at L5-S1 due to right-sided disc    Patient Stated Goals  patient would like to get back to PLOF, walking further, be better able to participate in dog showing and trips    Currently in Pain?  Yes    Pain Score  4     Pain Onset  More than a month ago         TREATMENT  Therapeutic exercise:to centralize  symptoms and improve ROM, strength, muscular endurance, and activity tolerance required for successful completion of functional activities.  Patient denied LEs exercises due to soreness near  UE strengthening without back rest to improve lumbar extension and core stabilization  Seated 1lb 3x10repsdumbbell shoulder flexion.  Seated 1lb 3x10repsdumbbell shoulder horizontal abduction.  Seated 1lb 3x10reps dumbbell shoulderabduction.  Seated 1lb shoulder flexion to the end of pain free range with 3x10 shoulder elevation and depression.  Seated 1lb 3x10reps dumbbell shoulder press up.  With yellow therapy band  Seated shoulder external rotation 3x10 reps   Seated shoulder D2 pattern 3x10 reps Patient states she felt stronger on her upper arms. Cuing provided to reinforce deep neck flexors activation and relaxation of bilateral upper traps.    Patient tolerated the sessionwell and no increase of her lowerback pain at the end. Patient demonstrated improved UEs AROM and mobility but has been having difficulties with progression of weights. Patient was unable to proceed with LEs exercises due to pain at abdominal incisions. Patient demonstrated optimized progression of her therapy. With the above presentation, session will continue focus on strengthening, endurance, and increase of functional mobility tolerance.Patient would benefit from continued physical therapy to address self-care for her pain, strength, and endurance to improve functional mobility and ADLs tolerance.      PT Education - 05/12/19 1312    Education provided  Yes    Education Details  Exercise purpose/form. Self management techniques    Person(s) Educated  Patient    Methods  Explanation;Demonstration;Tactile cues;Verbal cues    Comprehension  Verbalized understanding;Returned demonstration;Verbal cues required;Tactile cues required       PT Short Term Goals - 04/20/19 1406      PT SHORT TERM GOAL #1    Title  Be independent with initial home exercise program for self-management of symptoms.    Baseline  Initial HEP provided at IE (11/17/2018); was completing until vascular procedure, re-instated 12/15/2018 (12/15/2018); currently participating (01/26/2019); Partially doing HEP due the patient's recent health condition/weakness(03/23/2019); Patient has been doing her HEP every other day. 04/20/2019    Time  2    Period  Weeks    Status  Partially Met    Target  Date  05/04/19        PT Long Term Goals - 04/20/19 1641      PT LONG TERM GOAL #1   Title  Be independent with a long-term home exercise program for self-management of symptoms.     Baseline  Initial HEP provided at [facility] (11/17/2018); was doing better before vascular surgery, is working at returning to [facility] since procedure (12/15/2018); currently participating but not assigned final long term HEP yet (01/26/2019); currently participating but not assigned final long term HEP yet (03/23/2019); HEP progressing (04/20/2019)    Time  6    Period  Weeks    Status  Partially Met    Target Date  06/01/19      PT LONG TERM GOAL #2   Title  Patient will improve 6MWT to equal or greater than 1000 feet with LRAD to demonstrate improved activity tolerance for community ambulation.     Baseline  6 Minute Walk Test: 200 feet with SPC and supervision. Stopped at 1:43 due to fatigue and leg pain. (11/17/2018); 457 feet SPC, on seated and one standing break (12/15/2018); 547 with SPC, 2 seated and one standing rest break (01/26/2019); 468 feet with SPC, 2 seated breaks(03/23/2019); 638 feet with SPC, 2 seated breaks    Time  6    Period  Weeks    Status  Partially Met    Target Date  06/01/19      PT LONG TERM GOAL #3   Title  Patient will demonstrate improved 5 Times Sit to Stand test to 11 seconds from chair height with no UE support to demonstrate improvement in LE power and functional strength for daily activities.    Baseline  5 Time Sit to Stand: 27  seconds from chair height no UE support (11/17/2018); 18 seconds, no UE support from chair-high plinth (12/15/2018); 16 seconds with no UE support from chair (01/26/2019); 17.45s with lack of knee extension in standing(03/23/2019); 14.03s without BUEs help (04/20/2019)    Time  6    Period  Weeks    Status  Partially Met    Target Date  06/01/19      PT LONG TERM GOAL #4   Title  Patient will ambulate faster than 1.39ms on the 10 Meter Walk Test to improve ability to cross street safey for community participation.     Baseline  testing deferred to next session (11/17/2018); 1.37 m/sec with SPC (11/24/2018); 1.3 m/sec with SPC (12/15/2018; 01/26/2019); 0.453m (03/23/2019); 0.98 m/sec with SPC (04/20/2019)    Time  6    Period  Weeks    Status  Partially Met    Target Date  06/01/19      PT LONG TERM GOAL #5   Title  Patient will score equal or greater than  25/30 on the Functional Gait Assessment to demonstrate low fall risk to improve her ability to participate safely in community activities.     Baseline  testing deferred to next session (11/17/2018); 11/30 high fall risk (11/24/2018); unable to measure due to time limitation (12/15/2018); 13/30 high fall risk (12/17/2018); 14/30 high fall risk (01/26/2019); 14/30 high fall risk(03/23/2019); 17/30 high fall risk (04/20/2019)    Time  6    Period  Weeks    Status  Partially Met    Target Date  06/01/19      PT LONG TERM GOAL #6   Title  Reduce pain to equal or less than 4/10 with functional activities to allow patient to  complete valued functional tasks such as caring for dogs, grocery shopping, walking with less difficulty.(03/23/2019);    Baseline  8/10 (01/26/2019); 6/10 (03/23/2019); 4/10 (04/20/2019)    Time  6    Period  Weeks    Status  Partially Met    Target Date  06/01/19            Plan - 05/12/19 1313    Clinical Impression Statement  Patient tolerated the session well and no increase of her lower back pain at the end. Patient  demonstrated improved UEs AROM and mobility but has been having difficulties with progression of weights. Patient was unable to proceed with LEs exercises due to pain at abdominal incisions. Patient demonstrated optimized progression of her therapy. With the above presentation, session will continue focus on strengthening, endurance, and increase of functional mobility tolerance. Patient would benefit from continued physical therapy to address self-care for her pain, strength, and endurance to improve functional mobility and ADLs tolerance.    Personal Factors and Comorbidities  Age;Comorbidity 3+;Fitness;Time since onset of injury/illness/exacerbation;Past/Current Experience;Other    Comorbidities  underwent extensive vascular surgeries in Jan 2020 to improve blood flow, similar surgery for R leg scheduled 12/07/2018. Has a history of left carotid artery stenosis, but not enough to have surgery, has a history of multiple arterial surgeries, history of R cataract surgery, colon surgery, cholecystectomy, R foot skin graft, ovary surgery, gastric bypass, R foot fusion, abdominal hysterectomy; current smoker, plantar fasciitis, peripheral arterial disease, LVH, diabetes mellitus with neuropathy, irregular heartbeat, hiatal hernia, GERD, stenosis of left heart ventricle, depression, lumbar degenerative disc disease, cardiac murmur, asthma, anxiety, obesity, HTN, and seasonal allergies She has been advised not to lift over 10 pounds by physician.      Examination-Activity Limitations  Bathing;Carry;Lift;Sit;Stand;Locomotion Level;Toileting;Dressing;Squat;Transfers;Stairs;Hygiene/Grooming;Other    Examination-Participation Restrictions  Community Activity;Volunteer;Cleaning;Interpersonal Relationship;Yard Work;Other    Rehab Potential  Fair    Clinical Impairments Affecting Rehab Potential  (+) motivated (-) muliple co morbidities, chronic condition    PT Frequency  2x / week    PT Duration  12 weeks    PT  Treatment/Interventions  Moist Heat;Patient/family education;Neuromuscular re-education;Therapeutic exercise;Manual techniques;ADLs/Self Care Home Management;Electrical Stimulation;Gait training;Stair training;Functional mobility training;Therapeutic activities;Balance training;Passive range of motion;Dry needling;Energy conservation;Spinal Manipulations;Joint Manipulations;Cryotherapy    PT Next Visit Plan  continue manual and strengthening activities as tolerated    PT Home Exercise Plan  Medbridge Access Code: [email or web address]    Consulted and Agree with Plan of Care  Patient       Patient will benefit from skilled therapeutic intervention in order to improve the following deficits and impairments:  Decreased strength, Impaired flexibility, Decreased activity tolerance, Impaired perceived functional ability, Pain, Decreased endurance, Difficulty walking, Abnormal gait, Decreased knowledge of use of DME, Decreased skin integrity, Decreased range of motion, Impaired sensation, Improper body mechanics, Obesity, Postural dysfunction, Increased edema, Decreased mobility, Decreased balance, Cardiopulmonary status limiting activity, Decreased coordination  Visit Diagnosis: Muscle weakness (generalized)  Chronic bilateral low back pain, unspecified whether sciatica present  Difficulty in walking, not elsewhere classified  History of falling     Problem List Patient Active Problem List   Diagnosis Date Noted  . Osteopenia 03/03/2019  . Lower extremity edema 07/31/2018  . Pressure injury of skin 07/24/2018  . Surgical site infection 07/23/2018  . Wound of left leg 07/23/2018  . Atherosclerosis of artery of extremity with rest pain ([place]) 07/08/2018  . Shortness of breath 06/02/2018  . Bruit 06/02/2018  . Coronary artery disease of  native artery of native heart with stable angina pectoris ([place]) 06/02/2018  . Atherosclerosis of native arteries of extremity with intermittent claudication ([place])  05/19/2018  . Peripheral arterial disease ([place]) 05/06/2018  . Personal history of colonic polyps   . Aortic stenosis 12/24/2017  . LVH (left ventricular hypertrophy) 12/24/2017  . Hypertension with heart disease 12/24/2017  . Left atrial dilatation 12/12/2017  . Cardiac murmur 12/12/2017  . Gastrojejunal ulcer 12/04/2017  . Hyperphosphatemia 12/04/2017  . Spinal stenosis of lumbar region 06/18/2017  . Degeneration of lumbar intervertebral disc 06/02/2017  . Assistance needed with transportation 05/26/2017  . Financial difficulties 05/26/2017  . Needs assistance with community resources 05/26/2017  . Moderate recurrent major depression ([place]) 05/26/2017  . Non-healing wound of lower extremity 02/06/2017  . Status post ankle arthrodesis 12/11/2016  . Controlled substance agreement broken 10/11/2016  . Low back pain 09/25/2016  . Osteoarthritis of right subtalar joint 07/11/2016  . Posterior tibial tendinitis of right lower extremity 07/11/2016  . Breast cancer screening 06/18/2016  . Knee pain 04/03/2016  . Hyponatremia 03/01/2016  . High triglycerides 12/24/2015  . Pes planus of both feet 11/16/2015  . Plantar fasciitis of right foot 11/16/2015  . Heel spur 11/16/2015  . Diabetic neuropathy ([place]) 11/16/2015  . Right ankle pain 11/16/2015  . Medication monitoring encounter 11/16/2015  . Chronic pain of multiple joints 08/15/2015  . Abnormal mammogram of right breast 06/19/2015  . Cerumen impaction 03/16/2015  . Hyperlipidemia 12/14/2014  . History of transfusion of packed RBC 12/14/2014  . Hx of smoking 12/14/2014  . Status post bariatric surgery 12/14/2014  . Morbid obesity ([place]) 12/14/2014  . Chronic radicular lumbar pain 12/14/2014  . History of GI bleed 11/12/2014  . History of small bowel obstruction 09/07/2014    Sherrilyn Rist, SPT 05/12/19, 3:38 PM   Everlean Alstrom. [name], PT, DPT 05/12/19, 3:39 PM  [facility] [address] [place], [address] Phone: [phone]   Fax:  [phone]  Name: Andrea Holden MRN: [id number] Date of Birth: Mar 22, 1946

## 2019-05-13 ENCOUNTER — Encounter: Payer: Medicare Other | Admitting: Physical Therapy

## 2019-05-18 ENCOUNTER — Other Ambulatory Visit: Payer: Self-pay

## 2019-05-18 ENCOUNTER — Encounter: Payer: Self-pay | Admitting: Physical Therapy

## 2019-05-18 ENCOUNTER — Ambulatory Visit: Payer: Medicare Other | Admitting: Physical Therapy

## 2019-05-18 DIAGNOSIS — Z9181 History of falling: Secondary | ICD-10-CM

## 2019-05-18 DIAGNOSIS — G8929 Other chronic pain: Secondary | ICD-10-CM

## 2019-05-18 DIAGNOSIS — M6281 Muscle weakness (generalized): Secondary | ICD-10-CM

## 2019-05-18 DIAGNOSIS — R262 Difficulty in walking, not elsewhere classified: Secondary | ICD-10-CM

## 2019-05-18 NOTE — Therapy (Signed)
[facility] [address], [place], [address] Phone: [phone]   Fax:  [phone]  Physical Therapy Treatment  Patient Details  Name: Andrea Holden MRN: [id number] Date of Birth: April 30, 1946 Referring Provider (PT): Poulose, Bethel Born, NP  (Simultaneous filing. User may not have seen previous data.)   Encounter Date: 05/18/2019  PT End of Session - 05/18/19 1408    Visit Number  38    Number of Visits  28    Date for PT Re-Evaluation  06/01/19    Authorization Type  [facility] Medicare reporting period from  03/23/2019    Authorization - Visit Number  3    Authorization - Number of Visits  10    PT Start Time  0109    PT Stop Time  1435    PT Time Calculation (min)  39 min    Equipment Utilized During Treatment  Gait belt    Activity Tolerance  Patient limited by pain;Patient tolerated treatment well;Patient limited by fatigue    Behavior During Therapy  [facility] for tasks assessed/performed       Past Medical History:  Diagnosis Date  . Allergy    seasonal allergies  . Anemia 2014   needed 5 units of blood d/t passing out, weak  . Anxiety   . Aortic stenosis 12/24/2017   Echo Aug 2018  . Arthritis   . Asthma    allergy induced asthma  . Cardiac murmur 12/12/2017  . Cataract   . Cataract    left  . Complication of anesthesia    arrhythmia following colonoscopy  . Degenerative disc disease, lumbar   . Degenerative disc disease, lumbar   . Depression   . Diabetes mellitus   . Diabetic neuropathy ([place]) 11/16/2015  . Dysrhythmia    stenosis of left ventricle  . GERD (gastroesophageal reflux disease)   . H/O transfusion    patient was given 5 units of blood while at [place], blood type O+  . History of chicken pox   . History of hiatal hernia   . History of measles, mumps, or rubella   . HOH (hard of hearing)    does not use hearing aides yet  . Hyperlipidemia   . Hypertension   . Irregular  heartbeat   . LVH (left ventricular hypertrophy) 12/24/2017   Echo Aug 2018  . Neuropathy   . Opiate use 11/16/2015  . Peripheral arterial disease ([place]) 05/06/2018   At rest, left > right; refer to vasc  . Pes planus of both feet 11/16/2015  . Plantar fasciitis of right foot 11/16/2015  . Wheezing     Past Surgical History:  Procedure Laterality Date  . ABDOMINAL HYSTERECTOMY    . APPLICATION OF WOUND VAC Left 07/23/2018   Procedure: APPLICATION OF WOUND VAC;  Surgeon: Algernon Huxley, MD;  Location: [facility];  Service: Vascular;  Laterality: Left;  . banck injections    . CATARACT EXTRACTION W/PHACO Right 05/30/2015   Procedure: CATARACT EXTRACTION PHACO AND INTRAOCULAR LENS PLACEMENT (IOC);  Surgeon: Birder Robson, MD;  Location: [facility];  Service: Ophthalmology;  Laterality: Right;  [place] 00:57 AP% 20.9 CDE 11.99 fluid pack lot #[id number]  . CHOLECYSTECTOMY  1970  . COLON SURGERY  2013   blocked colon  . COLONOSCOPY WITH PROPOFOL N/A 01/20/2018   Procedure: COLONOSCOPY WITH PROPOFOL;  Surgeon: Lucilla Lame, MD;  Location: [facility];  Service: Endoscopy;  Laterality: N/A;  . ENDARTERECTOMY  FEMORAL Left 07/08/2018   Procedure: ENDARTERECTOMY FEMORAL;  Surgeon: Algernon Huxley, MD;  Location: [facility];  Service: Vascular;  Laterality: Left;  . EYE SURGERY Right    cataract surgery  . FOOT FUSION Right 2018   metal in foot  . GALLBLADDER SURGERY  1970  . GASTRIC BYPASS  2010   lost 178 lbs and regained 40 lbs last few years  . HUMERUS FRACTURE SURGERY Right    metal plate with screws  . internal bleeding  2016   ulcer in past  . LOWER EXTREMITY ANGIOGRAM Left 07/08/2018   Procedure: LOWER EXTREMITY ANGIOGRAM;  Surgeon: Algernon Huxley, MD;  Location: [facility];  Service: Vascular;  Laterality: Left;  . LOWER EXTREMITY ANGIOGRAPHY Left 05/27/2018   Procedure: LOWER EXTREMITY ANGIOGRAPHY;  Surgeon: Algernon Huxley, MD;  Location: [place];  Service: Cardiovascular;  Laterality:  Left;  . LOWER EXTREMITY ANGIOGRAPHY Right 12/07/2018   Procedure: LOWER EXTREMITY ANGIOGRAPHY;  Surgeon: Algernon Huxley, MD;  Location: [place] [facility];  Service: Cardiovascular;  Laterality: Right;  . LOWER EXTREMITY ANGIOGRAPHY Left 05/10/2019   Procedure: LOWER EXTREMITY ANGIOGRAPHY;  Surgeon: Algernon Huxley, MD;  Location: [place] [facility];  Service: Cardiovascular;  Laterality: Left;  . MOUTH SURGERY     root canals and crowns and extractions  . OVARY SURGERY    . SKIN GRAFT Right 2018   RT foot. foot has been rebuilt.  it is full of metal  . TENOTOMY ACHILLES TENDON Right    Percuntaneous. metal in foot  . TONSILLECTOMY    . WOUND DEBRIDEMENT Left 07/23/2018   Procedure: DEBRIDEMENT WOUND;  Surgeon: Algernon Huxley, MD;  Location: [facility];  Service: Vascular;  Laterality: Left;    There were no vitals filed for this visit.  Subjective Assessment - 05/18/19 1402    Subjective  Patient reports she is feeling better than last week. She reports 4/10 pain in the R leg and the low back. She was supposed to get an injection in the low back tomorrow but there is some sort of hold up due to insurance so she is waiting to see if she can still have it. She states she did not feel good following last treatment session, just aching through the legs. She feels like it was more related to her procedure and felt the weights she did for the upper extremities was fine. She states her exercises at home have been more sporadic. She wants to focus on using the yellow band to stretch out both legs because the R calf has been cramping a lot. She agrees she may walk to the mailbox 1.5 times a day instead of once.    Pertinent History  Patient is a 73 y.o. female who presents to outpatient physical therapy with a referral for medical diagnosis of degeneration of lumbar intervertebral disc. This patient's chief complaints consist of chronic low back pain and foot pain, weakness, stiffness, leading to the following  functional deficits: difficulty with with basic ADLs, IADLs, ambulation. She used to get injections to help control back pain but it has recently worsened due to being unable to get injections during [place] pandemic. The patient has been informed of current processes in place at [facility]patient [facility]hab to protect patients from Covid-19 exposure including social distancing, schedule modifications, and new cleaning procedures. After discussing their particular risk with a therapist based on the patient's personal risk factors, the patient has decided to proceed with in-person  therapy. Pt is scheduled for R LE angiography on 12/07/2018. She has recently started smoking again with the stress of COVID19 pandemic and feels strongly she needs to stop. Surgeon has stated in the past he will not complete surgery if she is smoking.  Relevant past medical history and comorbidities include underwent extensive vascular surgeries in Jan 2020 to improve blood flow, similar surgery for R leg scheduled 12/07/2018. Has a history of left carotid artery stenosis, but not enough to have surgery, has a history of multiple arterial surgeries, history of R cataract surgery, colon surgery, cholecystectomy, R foot skin graft, ovary surgery, gastric bypass, R foot fusion, abdominal hysterectomy; current smoker, plantar fasciitis, peripheral arterial disease, LVH, diabetes mellitus with neuropathy, irregular heartbeat, hiatal hernia, GERD, stenosis of left heart ventricle, depression, lumbar degenerative disc disease, cardiac murmur, asthma, anxiety, obesity, HTN, and seasonal allergies She has been advised not to lift over 10 pounds by physician    Limitations  Sitting;Standing;Walking;House hold activities;Lifting    How long can you sit comfortably?  > 1 hour in almost any chair    How long can you stand comfortably?  4-5 minutes    How long can you walk comfortably?  2 minutes, depends on what she has with her. Able to walk at fair with  rollator. To mailbox 120 feet causes shaking in R leg.      Diagnostic tests  MRI lumbar spine report 06/14/2017: "IMPRESSION: 1. Stable degenerative of lumbar spondylosis and scoliosis with multilevel disc disease and facet disease. 2. Stable right foraminal stenosis at L5-S1 due to right-sided disc    Patient Stated Goals  patient would like to get back to PLOF, walking further, be better able to participate in dog showing and trips    Currently in Pain?  Yes    Pain Score  4     Pain Onset  More than a month ago      TREATMENT  Therapeutic exercise:to centralize symptoms and improve ROM, strength, muscular endurance, and activity tolerance required for successful completion of functional activities.  [id number] and BLEs Seatsetting 8/handle setting7. For improved extremity mobility, muscular endurance, and activity tolerance; and to induce the analgesic effect of aerobic exercise, stimulate improved joint nutrition, and prepare body structures and systems for following interventions.during subjective exam.Average SPM =46, 0.212mes.   Seated hamstring stretch/mobilization with yellow theraband looped around foot and pulling with hands while extending knee. X 10 each side UE strengthening without back rest to improve lumbar extension and core stabilization  Seated 2lb x8repsdumbbell shoulder flexion.  Seated 2lb x5repsdumbbell shoulder horizontal abduction.  Seated lb x5reps dumbbell shoulderabduction.  Horizontal shoulder abduction against yellow band, 2x10 with cuing for palms up.  Cuing for form and discussion of patient's goals for HEP with development of updated HEP by end of session.    HOME EXERCISE PROGRAM   Access Code: [email or web address]: [email or web address]  Date: 05/18/2019  Prepared by: SaRosita Kea Exercises  Walking - 1.5 to the mailbox and back - 1.5 2 minutes in the house - 1x daily - 7x weekly  Leg stretch - 10 reps - 3  seconds hold - 2x daily - 7x weekly  Seated Shoulder Flexion with Dumbbells - 2 sets - 5 reps - 1 second hold - 1x daily - 7x weekly  Seated Shoulder Abduction with Dumbbells - Thumbs Up - 2 sets - 5 reps - 1 second hold - 1x daily - 7x weekly  Seated Shoulder Horizontal Abduction with  Resistance - 2 sets - 10 reps - 1 second hold - 1x daily - 7x weekly       PT Education - 05/18/19 1408    Education provided  Yes    Education Details  Exercise purpose/form. Self management techniques    Person(s) Educated  Patient    Methods  Explanation;Demonstration;Tactile cues;Verbal cues    Comprehension  Verbalized understanding;Returned demonstration;Verbal cues required;Tactile cues required;Need further instruction       PT Short Term Goals - 04/20/19 1406      PT SHORT TERM GOAL #1   Title  Be independent with initial home exercise program for self-management of symptoms.    Baseline  Initial HEP provided at IE (11/17/2018); was completing until vascular procedure, re-instated 12/15/2018 (12/15/2018); currently participating (01/26/2019); Partially doing HEP due the patient's recent health condition/weakness(03/23/2019); Patient has been doing her HEP every other day. 04/20/2019    Time  2    Period  Weeks    Status  Partially Met    Target Date  05/04/19        PT Long Term Goals - 04/20/19 1641      PT LONG TERM GOAL #1   Title  Be independent with a long-term home exercise program for self-management of symptoms.     Baseline  Initial HEP provided at [facility] (11/17/2018); was doing better before vascular surgery, is working at returning to [facility] since procedure (12/15/2018); currently participating but not assigned final long term HEP yet (01/26/2019); currently participating but not assigned final long term HEP yet (03/23/2019); HEP progressing (04/20/2019)    Time  6    Period  Weeks    Status  Partially Met    Target Date  06/01/19      PT LONG TERM GOAL #2   Title  Patient will improve  6MWT to equal or greater than 1000 feet with LRAD to demonstrate improved activity tolerance for community ambulation.     Baseline  6 Minute Walk Test: 200 feet with SPC and supervision. Stopped at 1:43 due to fatigue and leg pain. (11/17/2018); 457 feet SPC, on seated and one standing break (12/15/2018); 547 with SPC, 2 seated and one standing rest break (01/26/2019); 468 feet with SPC, 2 seated breaks(03/23/2019); 638 feet with SPC, 2 seated breaks    Time  6    Period  Weeks    Status  Partially Met    Target Date  06/01/19      PT LONG TERM GOAL #3   Title  Patient will demonstrate improved 5 Times Sit to Stand test to 11 seconds from chair height with no UE support to demonstrate improvement in LE power and functional strength for daily activities.    Baseline  5 Time Sit to Stand: 27 seconds from chair height no UE support (11/17/2018); 18 seconds, no UE support from chair-high plinth (12/15/2018); 16 seconds with no UE support from chair (01/26/2019); 17.45s with lack of knee extension in standing(03/23/2019); 14.03s without BUEs help (04/20/2019)    Time  6    Period  Weeks    Status  Partially Met    Target Date  06/01/19      PT LONG TERM GOAL #4   Title  Patient will ambulate faster than 1.60ms on the 10 Meter Walk Test to improve ability to cross street safey for community participation.     Baseline  testing deferred to next session (11/17/2018); 1.37 m/sec with SPC (11/24/2018); 1.3 m/sec with SPC (  12/15/2018; 01/26/2019); 0.32ms (03/23/2019); 0.98 m/sec with SPC (04/20/2019)    Time  6    Period  Weeks    Status  Partially Met    Target Date  06/01/19      PT LONG TERM GOAL #5   Title  Patient will score equal or greater than  25/30 on the Functional Gait Assessment to demonstrate low fall risk to improve her ability to participate safely in community activities.     Baseline  testing deferred to next session (11/17/2018); 11/30 high fall risk (11/24/2018); unable to measure due to time  limitation (12/15/2018); 13/30 high fall risk (12/17/2018); 14/30 high fall risk (01/26/2019); 14/30 high fall risk(03/23/2019); 17/30 high fall risk (04/20/2019)    Time  6    Period  Weeks    Status  Partially Met    Target Date  06/01/19      PT LONG TERM GOAL #6   Title  Reduce pain to equal or less than 4/10 with functional activities to allow patient to complete valued functional tasks such as caring for dogs, grocery shopping, walking with less difficulty.(03/23/2019);    Baseline  8/10 (01/26/2019); 6/10 (03/23/2019); 4/10 (04/20/2019)    Time  6    Period  Weeks    Status  Partially Met    Target Date  06/01/19            Plan - 05/18/19 1437    Clinical Impression Statement  Pt tolerated treatment well with some difficulty due to pain and fatigue. She was able to progress to more lower extremity exercises and her goal of walking 1.5 times to the mail box every day was discussed and agreed upon. Visit focused on updating HEP to reflect patient's goals and needs. Pt was able to complete all exercises with minimal to no lasting increase in pain or discomfort. Pt required multimodal cuing for proper technique and to facilitate improved neuromuscular control, strength, range of motion, and functional ability resulting in improved performance and form. Patient would benefit from continued physical therapy to address remaining impairments and functional limitations to work towards stated goals and return to PLOF or maximal functional independence.    Personal Factors and Comorbidities  Age;Comorbidity 3+;Fitness;Time since onset of injury/illness/exacerbation;Past/Current Experience;Other    Comorbidities  underwent extensive vascular surgeries in Jan 2020 to improve blood flow, similar surgery for R leg scheduled 12/07/2018. Has a history of left carotid artery stenosis, but not enough to have surgery, has a history of multiple arterial surgeries, history of R cataract surgery, colon surgery,  cholecystectomy, R foot skin graft, ovary surgery, gastric bypass, R foot fusion, abdominal hysterectomy; current smoker, plantar fasciitis, peripheral arterial disease, LVH, diabetes mellitus with neuropathy, irregular heartbeat, hiatal hernia, GERD, stenosis of left heart ventricle, depression, lumbar degenerative disc disease, cardiac murmur, asthma, anxiety, obesity, HTN, and seasonal allergies She has been advised not to lift over 10 pounds by physician.      Examination-Activity Limitations  Bathing;Carry;Lift;Sit;Stand;Locomotion Level;Toileting;Dressing;Squat;Transfers;Stairs;Hygiene/Grooming;Other    Examination-Participation Restrictions  Community Activity;Volunteer;Cleaning;Interpersonal Relationship;Yard Work;Other    Rehab Potential  Fair    Clinical Impairments Affecting Rehab Potential  (+) motivated (-) muliple co morbidities, chronic condition    PT Frequency  2x / week    PT Duration  12 weeks    PT Treatment/Interventions  Moist Heat;Patient/family education;Neuromuscular re-education;Therapeutic exercise;Manual techniques;ADLs/Self Care Home Management;Electrical Stimulation;Gait training;Stair training;Functional mobility training;Therapeutic activities;Balance training;Passive range of motion;Dry needling;Energy conservation;Spinal Manipulations;Joint Manipulations;Cryotherapy    PT Next Visit Plan  continue manual and strengthening activities as tolerated    PT Home Exercise Plan  Medbridge Access Code: [email or web address]    Consulted and Agree with Plan of Care  Patient       Patient will benefit from skilled therapeutic intervention in order to improve the following deficits and impairments:  Decreased strength, Impaired flexibility, Decreased activity tolerance, Impaired perceived functional ability, Pain, Decreased endurance, Difficulty walking, Abnormal gait, Decreased knowledge of use of DME, Decreased skin integrity, Decreased range of motion, Impaired sensation, Improper body  mechanics, Obesity, Postural dysfunction, Increased edema, Decreased mobility, Decreased balance, Cardiopulmonary status limiting activity, Decreased coordination  Visit Diagnosis: Muscle weakness (generalized)  Chronic bilateral low back pain, unspecified whether sciatica present  Difficulty in walking, not elsewhere classified  History of falling     Problem List Patient Active Problem List   Diagnosis Date Noted  . Osteopenia 03/03/2019  . Lower extremity edema 07/31/2018  . Pressure injury of skin 07/24/2018  . Surgical site infection 07/23/2018  . Wound of left leg 07/23/2018  . Atherosclerosis of artery of extremity with rest pain ([place]) 07/08/2018  . Shortness of breath 06/02/2018  . Bruit 06/02/2018  . Coronary artery disease of native artery of native heart with stable angina pectoris ([place]) 06/02/2018  . Atherosclerosis of native arteries of extremity with intermittent claudication ([place]) 05/19/2018  . Peripheral arterial disease ([place]) 05/06/2018  . Personal history of colonic polyps   . Aortic stenosis 12/24/2017  . LVH (left ventricular hypertrophy) 12/24/2017  . Hypertension with heart disease 12/24/2017  . Left atrial dilatation 12/12/2017  . Cardiac murmur 12/12/2017  . Gastrojejunal ulcer 12/04/2017  . Hyperphosphatemia 12/04/2017  . Spinal stenosis of lumbar region 06/18/2017  . Degeneration of lumbar intervertebral disc 06/02/2017  . Assistance needed with transportation 05/26/2017  . Financial difficulties 05/26/2017  . Needs assistance with community resources 05/26/2017  . Moderate recurrent major depression ([place]) 05/26/2017  . Non-healing wound of lower extremity 02/06/2017  . Status post ankle arthrodesis 12/11/2016  . Controlled substance agreement broken 10/11/2016  . Low back pain 09/25/2016  . Osteoarthritis of right subtalar joint 07/11/2016  . Posterior tibial tendinitis of right lower extremity 07/11/2016  . Breast cancer screening 06/18/2016   . Knee pain 04/03/2016  . Hyponatremia 03/01/2016  . High triglycerides 12/24/2015  . Pes planus of both feet 11/16/2015  . Plantar fasciitis of right foot 11/16/2015  . Heel spur 11/16/2015  . Diabetic neuropathy ([place]) 11/16/2015  . Right ankle pain 11/16/2015  . Medication monitoring encounter 11/16/2015  . Chronic pain of multiple joints 08/15/2015  . Abnormal mammogram of right breast 06/19/2015  . Cerumen impaction 03/16/2015  . Hyperlipidemia 12/14/2014  . History of transfusion of packed RBC 12/14/2014  . Hx of smoking 12/14/2014  . Status post bariatric surgery 12/14/2014  . Morbid obesity ([place]) 12/14/2014  . Chronic radicular lumbar pain 12/14/2014  . History of GI bleed 11/12/2014  . History of small bowel obstruction 09/07/2014   Everlean Alstrom. [name], PT, DPT 05/18/19, 2:40 PM  [facility] [address] [place], [address] Phone: [phone]   Fax:  [phone]  Name: Andrea Holden MRN: [id number] Date of Birth: 1945/12/20

## 2019-05-19 ENCOUNTER — Other Ambulatory Visit: Payer: Self-pay

## 2019-05-19 DIAGNOSIS — E1142 Type 2 diabetes mellitus with diabetic polyneuropathy: Secondary | ICD-10-CM

## 2019-05-20 ENCOUNTER — Encounter: Payer: Self-pay | Admitting: Physical Therapy

## 2019-05-20 ENCOUNTER — Ambulatory Visit: Payer: Medicare Other | Admitting: Physical Therapy

## 2019-05-20 ENCOUNTER — Other Ambulatory Visit: Payer: Self-pay

## 2019-05-20 DIAGNOSIS — G8929 Other chronic pain: Secondary | ICD-10-CM

## 2019-05-20 DIAGNOSIS — M6281 Muscle weakness (generalized): Secondary | ICD-10-CM

## 2019-05-20 DIAGNOSIS — R262 Difficulty in walking, not elsewhere classified: Secondary | ICD-10-CM

## 2019-05-20 DIAGNOSIS — Z9181 History of falling: Secondary | ICD-10-CM

## 2019-05-20 MED ORDER — GABAPENTIN 400 MG PO CAPS: 400.0000 mg | ORAL_CAPSULE | Freq: Two times a day (BID) | ORAL | 1 refills | Status: DC

## 2019-05-20 NOTE — Therapy (Signed)
[facility] [address], [place], [address] Phone: [phone]   Fax:  [phone]  Physical Therapy Treatment  Patient Details  Name: Andrea Holden MRN: [id number] Date of Birth: Apr 27, 1946 Referring Provider (PT): Poulose, Bethel Born, NP  (Simultaneous filing. User may not have seen previous data.)   Encounter Date: 05/20/2019  PT End of Session - 05/20/19 1906    Visit Number  39    Number of Visits  30    Date for PT Re-Evaluation  06/01/19    Authorization Type  [facility] Medicare reporting period from  03/23/2019    Authorization - Visit Number  4    Authorization - Number of Visits  10    PT Start Time  1400    PT Stop Time  1428    PT Time Calculation (min)  28 min    Equipment Utilized During Treatment  Gait belt    Activity Tolerance  Patient limited by pain;Patient tolerated treatment well;Patient limited by fatigue    Behavior During Therapy  [facility] for tasks assessed/performed       Past Medical History:  Diagnosis Date  . Allergy    seasonal allergies  . Anemia 2014   needed 5 units of blood d/t passing out, weak  . Anxiety   . Aortic stenosis 12/24/2017   Echo Aug 2018  . Arthritis   . Asthma    allergy induced asthma  . Cardiac murmur 12/12/2017  . Cataract   . Cataract    left  . Complication of anesthesia    arrhythmia following colonoscopy  . Degenerative disc disease, lumbar   . Degenerative disc disease, lumbar   . Depression   . Diabetes mellitus   . Diabetic neuropathy ([place]) 11/16/2015  . Dysrhythmia    stenosis of left ventricle  . GERD (gastroesophageal reflux disease)   . H/O transfusion    patient was given 5 units of blood while at [place], blood type O+  . History of chicken pox   . History of hiatal hernia   . History of measles, mumps, or rubella   . HOH (hard of hearing)    does not use hearing aides yet  . Hyperlipidemia   . Hypertension   . Irregular  heartbeat   . LVH (left ventricular hypertrophy) 12/24/2017   Echo Aug 2018  . Neuropathy   . Opiate use 11/16/2015  . Peripheral arterial disease ([place]) 05/06/2018   At rest, left > right; refer to vasc  . Pes planus of both feet 11/16/2015  . Plantar fasciitis of right foot 11/16/2015  . Wheezing     Past Surgical History:  Procedure Laterality Date  . ABDOMINAL HYSTERECTOMY    . APPLICATION OF WOUND VAC Left 07/23/2018   Procedure: APPLICATION OF WOUND VAC;  Surgeon: Algernon Huxley, MD;  Location: [facility] ORS;  Service: Vascular;  Laterality: Left;  . banck injections    . CATARACT EXTRACTION W/PHACO Right 05/30/2015   Procedure: CATARACT EXTRACTION PHACO AND INTRAOCULAR LENS PLACEMENT (IOC);  Surgeon: Birder Robson, MD;  Location: [facility] ORS;  Service: Ophthalmology;  Laterality: Right;  [place] 00:57 AP% 20.9 CDE 11.99 fluid pack lot #[id number]  . CHOLECYSTECTOMY  1970  . COLON SURGERY  2013   blocked colon  . COLONOSCOPY WITH PROPOFOL N/A 01/20/2018   Procedure: COLONOSCOPY WITH PROPOFOL;  Surgeon: Lucilla Lame, MD;  Location: [facility] ENDOSCOPY;  Service: Endoscopy;  Laterality: N/A;  . ENDARTERECTOMY  FEMORAL Left 07/08/2018   Procedure: ENDARTERECTOMY FEMORAL;  Surgeon: Algernon Huxley, MD;  Location: [facility] ORS;  Service: Vascular;  Laterality: Left;  . EYE SURGERY Right    cataract surgery  . FOOT FUSION Right 2018   metal in foot  . GALLBLADDER SURGERY  1970  . GASTRIC BYPASS  2010   lost 178 lbs and regained 40 lbs last few years  . HUMERUS FRACTURE SURGERY Right    metal plate with screws  . internal bleeding  2016   ulcer in past  . LOWER EXTREMITY ANGIOGRAM Left 07/08/2018   Procedure: LOWER EXTREMITY ANGIOGRAM;  Surgeon: Algernon Huxley, MD;  Location: [facility] ORS;  Service: Vascular;  Laterality: Left;  . LOWER EXTREMITY ANGIOGRAPHY Left 05/27/2018   Procedure: LOWER EXTREMITY ANGIOGRAPHY;  Surgeon: Algernon Huxley, MD;  Location: [facility];  Service: Cardiovascular;  Laterality:  Left;  . LOWER EXTREMITY ANGIOGRAPHY Right 12/07/2018   Procedure: LOWER EXTREMITY ANGIOGRAPHY;  Surgeon: Algernon Huxley, MD;  Location: [facility];  Service: Cardiovascular;  Laterality: Right;  . LOWER EXTREMITY ANGIOGRAPHY Left 05/10/2019   Procedure: LOWER EXTREMITY ANGIOGRAPHY;  Surgeon: Algernon Huxley, MD;  Location: [facility];  Service: Cardiovascular;  Laterality: Left;  . MOUTH SURGERY     root canals and crowns and extractions  . OVARY SURGERY    . SKIN GRAFT Right 2018   RT foot. foot has been rebuilt.  it is full of metal  . TENOTOMY ACHILLES TENDON Right    Percuntaneous. metal in foot  . TONSILLECTOMY    . WOUND DEBRIDEMENT Left 07/23/2018   Procedure: DEBRIDEMENT WOUND;  Surgeon: Algernon Huxley, MD;  Location: [facility] ORS;  Service: Vascular;  Laterality: Left;    There were no vitals filed for this visit.  Subjective Assessment - 05/20/19 1904    Subjective  Patient states she has 5/10 pain in the low back and bilateral legs. States she met her daily walking goal yesterday of 1.5 times to the mailbox and back but has not completed her other HEP yet. States she felt okay folloiwng last treatment session. Would like to work on some mat exercises for the legs today as she did several months ago.    Pertinent History  Patient is a 73 y.o. female who presents to outpatient physical therapy with a referral for medical diagnosis of degeneration of lumbar intervertebral disc. This patient's chief complaints consist of chronic low back pain and foot pain, weakness, stiffness, leading to the following functional deficits: difficulty with with basic ADLs, IADLs, ambulation. She used to get injections to help control back pain but it has recently worsened due to being unable to get injections during [place] pandemic. The patient has been informed of current processes in place at Outpatient [facility]hab to protect patients from Covid-19 exposure including social distancing, schedule  modifications, and new cleaning procedures. After discussing their particular risk with a therapist based on the patient's personal risk factors, the patient has decided to proceed with in-person therapy. Pt is scheduled for R LE angiography on 12/07/2018. She has recently started smoking again with the stress of COVID19 pandemic and feels strongly she needs to stop. Surgeon has stated in the past he will not complete surgery if she is smoking.  Relevant past medical history and comorbidities include underwent extensive vascular surgeries in Jan 2020 to improve blood flow, similar surgery for R leg scheduled 12/07/2018. Has a history of left carotid artery stenosis, but  not enough to have surgery, has a history of multiple arterial surgeries, history of R cataract surgery, colon surgery, cholecystectomy, R foot skin graft, ovary surgery, gastric bypass, R foot fusion, abdominal hysterectomy; current smoker, plantar fasciitis, peripheral arterial disease, LVH, diabetes mellitus with neuropathy, irregular heartbeat, hiatal hernia, GERD, stenosis of left heart ventricle, depression, lumbar degenerative disc disease, cardiac murmur, asthma, anxiety, obesity, HTN, and seasonal allergies She has been advised not to lift over 10 pounds by physician    Limitations  Sitting;Standing;Walking;House hold activities;Lifting    How long can you sit comfortably?  > 1 hour in almost any chair    How long can you stand comfortably?  4-5 minutes    How long can you walk comfortably?  2 minutes, depends on what she has with her. Able to walk at fair with rollator. To mailbox 120 feet causes shaking in R leg.      Diagnostic tests  MRI lumbar spine report 06/14/2017: "IMPRESSION: 1. Stable degenerative of lumbar spondylosis and scoliosis with multilevel disc disease and facet disease. 2. Stable right foraminal stenosis at L5-S1 due to right-sided disc    Patient Stated Goals  patient would like to get back to PLOF, walking further,  be better able to participate in dog showing and trips    Currently in Pain?  Yes    Pain Score  5     Pain Onset  More than a month ago        OBJECTIVE 6MWT: 600 feet with SPC   TREATMENT  Therapeutic exercise:to centralize symptoms and improve ROM, strength, muscular endurance, and activity tolerance required for successful completion of functional activities.  Ambulation around the clinic for 6 minutes with three standing rests and cuing for improved posture, speed, and pacing. Traversed 600 feet.   Mat Exercises:   sidelying hip abduction x 10 each side manual support to maintain proper form.   Supine bridges x10  Supine abdominal brace with active stragiht leg raise. X 10 each side.   Prone hip extension x 10 each side  Prone TKE 2x10, with manual support under toes to improve comfort.  Cuing for form, assistance with bed mobility, and manual support as needed to achieve proper form.   Education on HEP and walking program.    HOME EXERCISE PROGRAM   Access Code: [email or web address]  URL: [email or web address]  Date: 05/18/2019  Prepared by: Rosita Kea   Exercises  Walking - 1.5 to the mailbox and back - 1.5 2 minutes in the house - 1x daily - 7x weekly  Leg stretch - 10 reps - 3 seconds hold - 2x daily - 7x weekly  Seated Shoulder Flexion with Dumbbells - 2 sets - 5 reps - 1 second hold - 1x daily - 7x weekly  Seated Shoulder Abduction with Dumbbells - Thumbs Up - 2 sets - 5 reps - 1 second hold - 1x daily - 7x weekly  Seated Shoulder Horizontal Abduction with Resistance - 2 sets - 10 reps - 1 second hold - 1x daily - 7x weekly     PT Education - 05/20/19 1906    Education provided  Yes    Education Details  Exercise purpose/form. Self management techniques    Person(s) Educated  Patient    Methods  Explanation;Demonstration;Tactile cues;Verbal cues    Comprehension  Verbalized understanding;Returned demonstration;Verbal cues required;Need further  instruction       PT Short Term Goals - 04/20/19 1406      PT SHORT TERM  GOAL #1   Title  Be independent with initial home exercise program for self-management of symptoms.    Baseline  Initial HEP provided at IE (11/17/2018); was completing until vascular procedure, re-instated 12/15/2018 (12/15/2018); currently participating (01/26/2019); Partially doing HEP due the patient's recent health condition/weakness(03/23/2019); Patient has been doing her HEP every other day. 04/20/2019    Time  2    Period  Weeks    Status  Partially Met    Target Date  05/04/19        PT Long Term Goals - 04/20/19 1641      PT LONG TERM GOAL #1   Title  Be independent with a long-term home exercise program for self-management of symptoms.     Baseline  Initial HEP provided at IE (11/17/2018); was doing better before vascular surgery, is working at returning to [facility] since procedure (12/15/2018); currently participating but not assigned final long term HEP yet (01/26/2019); currently participating but not assigned final long term HEP yet (03/23/2019); HEP progressing (04/20/2019)    Time  6    Period  Weeks    Status  Partially Met    Target Date  06/01/19      PT LONG TERM GOAL #2   Title  Patient will improve 6MWT to equal or greater than 1000 feet with LRAD to demonstrate improved activity tolerance for community ambulation.     Baseline  6 Minute Walk Test: 200 feet with SPC and supervision. Stopped at 1:43 due to fatigue and leg pain. (11/17/2018); 457 feet SPC, on seated and one standing break (12/15/2018); 547 with SPC, 2 seated and one standing rest break (01/26/2019); 468 feet with SPC, 2 seated breaks(03/23/2019); 638 feet with SPC, 2 seated breaks    Time  6    Period  Weeks    Status  Partially Met    Target Date  06/01/19      PT LONG TERM GOAL #3   Title  Patient will demonstrate improved 5 Times Sit to Stand test to 11 seconds from chair height with no UE support to demonstrate improvement in LE  power and functional strength for daily activities.    Baseline  5 Time Sit to Stand: 27 seconds from chair height no UE support (11/17/2018); 18 seconds, no UE support from chair-high plinth (12/15/2018); 16 seconds with no UE support from chair (01/26/2019); 17.45s with lack of knee extension in standing(03/23/2019); 14.03s without BUEs help (04/20/2019)    Time  6    Period  Weeks    Status  Partially Met    Target Date  06/01/19      PT LONG TERM GOAL #4   Title  Patient will ambulate faster than 1.40ms on the 10 Meter Walk Test to improve ability to cross street safey for community participation.     Baseline  testing deferred to next session (11/17/2018); 1.37 m/sec with SPC (11/24/2018); 1.3 m/sec with SPC (12/15/2018; 01/26/2019); 0.432m (03/23/2019); 0.98 m/sec with SPC (04/20/2019)    Time  6    Period  Weeks    Status  Partially Met    Target Date  06/01/19      PT LONG TERM GOAL #5   Title  Patient will score equal or greater than  25/30 on the Functional Gait Assessment to demonstrate low fall risk to improve her ability to participate safely in community activities.     Baseline  testing deferred to next session (11/17/2018); 11/30 high fall risk (11/24/2018); unable  to measure due to time limitation (12/15/2018); 13/30 high fall risk (12/17/2018); 14/30 high fall risk (01/26/2019); 14/30 high fall risk(03/23/2019); 17/30 high fall risk (04/20/2019)    Time  6    Period  Weeks    Status  Partially Met    Target Date  06/01/19      PT LONG TERM GOAL #6   Title  Reduce pain to equal or less than 4/10 with functional activities to allow patient to complete valued functional tasks such as caring for dogs, grocery shopping, walking with less difficulty.(03/23/2019);    Baseline  8/10 (01/26/2019); 6/10 (03/23/2019); 4/10 (04/20/2019)    Time  6    Period  Weeks    Status  Partially Met    Target Date  06/01/19            Plan - 05/20/19 1908    Clinical Impression Statement   Patient was 10 minutes late and required another 5 minutes after arriving to be ready to participate due to bringing donuts for everyone in the clinic. Pt tolerated treatment well and was able to progress towards more lower extremity exercises. Pt was able to complete all exercises with minimal to no lasting increase in pain or discomfort. She reported she felt more flexible by the end with some pain in the low back that is about the same as at the start of the session. Patient required encouragement and extensive cuing and min A to get into proper positions. Pt required multimodal cuing for proper technique and to facilitate improved neuromuscular control, strength, range of motion, and functional ability resulting in improved performance and form. Continued to reinforce the importance of working towards her home exercise goals. Patient would benefit from continued physical therapy to address remaining impairments and functional limitations to work towards stated goals and return to PLOF or maximal functional independence.    Personal Factors and Comorbidities  Age;Comorbidity 3+;Fitness;Time since onset of injury/illness/exacerbation;Past/Current Experience;Other    Comorbidities  underwent extensive vascular surgeries in Jan 2020 to improve blood flow, similar surgery for R leg scheduled 12/07/2018. Has a history of left carotid artery stenosis, but not enough to have surgery, has a history of multiple arterial surgeries, history of R cataract surgery, colon surgery, cholecystectomy, R foot skin graft, ovary surgery, gastric bypass, R foot fusion, abdominal hysterectomy; current smoker, plantar fasciitis, peripheral arterial disease, LVH, diabetes mellitus with neuropathy, irregular heartbeat, hiatal hernia, GERD, stenosis of left heart ventricle, depression, lumbar degenerative disc disease, cardiac murmur, asthma, anxiety, obesity, HTN, and seasonal allergies She has been advised not to lift over 10 pounds by  physician.      Examination-Activity Limitations  Bathing;Carry;Lift;Sit;Stand;Locomotion Level;Toileting;Dressing;Squat;Transfers;Stairs;Hygiene/Grooming;Other    Examination-Participation Restrictions  Community Activity;Volunteer;Cleaning;Interpersonal Relationship;Yard Work;Other    Rehab Potential  Fair    Clinical Impairments Affecting Rehab Potential  (+) motivated (-) muliple co morbidities, chronic condition    PT Frequency  2x / week    PT Duration  12 weeks    PT Treatment/Interventions  Moist Heat;Patient/family education;Neuromuscular re-education;Therapeutic exercise;Manual techniques;ADLs/Self Care Home Management;Electrical Stimulation;Gait training;Stair training;Functional mobility training;Therapeutic activities;Balance training;Passive range of motion;Dry needling;Energy conservation;Spinal Manipulations;Joint Manipulations;Cryotherapy    PT Next Visit Plan  strengthening, pain control, and promotion of self-management techniques    PT Home Exercise Plan  Medbridge Access Code: [email or web address]    Consulted and Agree with Plan of Care  Patient       Patient will benefit from skilled therapeutic intervention in order to improve the following deficits and  impairments:  Decreased strength, Impaired flexibility, Decreased activity tolerance, Impaired perceived functional ability, Pain, Decreased endurance, Difficulty walking, Abnormal gait, Decreased knowledge of use of DME, Decreased skin integrity, Decreased range of motion, Impaired sensation, Improper body mechanics, Obesity, Postural dysfunction, Increased edema, Decreased mobility, Decreased balance, Cardiopulmonary status limiting activity, Decreased coordination  Visit Diagnosis: Muscle weakness (generalized)  Chronic bilateral low back pain, unspecified whether sciatica present  Difficulty in walking, not elsewhere classified  History of falling     Problem List Patient Active Problem List   Diagnosis Date Noted  .  Osteopenia 03/03/2019  . Lower extremity edema 07/31/2018  . Pressure injury of skin 07/24/2018  . Surgical site infection 07/23/2018  . Wound of left leg 07/23/2018  . Atherosclerosis of artery of extremity with rest pain ([place]) 07/08/2018  . Shortness of breath 06/02/2018  . Bruit 06/02/2018  . Coronary artery disease of native artery of native heart with stable angina pectoris ([place]) 06/02/2018  . Atherosclerosis of native arteries of extremity with intermittent claudication ([place]) 05/19/2018  . Peripheral arterial disease ([place]) 05/06/2018  . Personal history of colonic polyps   . Aortic stenosis 12/24/2017  . LVH (left ventricular hypertrophy) 12/24/2017  . Hypertension with heart disease 12/24/2017  . Left atrial dilatation 12/12/2017  . Cardiac murmur 12/12/2017  . Gastrojejunal ulcer 12/04/2017  . Hyperphosphatemia 12/04/2017  . Spinal stenosis of lumbar region 06/18/2017  . Degeneration of lumbar intervertebral disc 06/02/2017  . Assistance needed with transportation 05/26/2017  . Financial difficulties 05/26/2017  . Needs assistance with community resources 05/26/2017  . Moderate recurrent major depression ([place]) 05/26/2017  . Non-healing wound of lower extremity 02/06/2017  . Status post ankle arthrodesis 12/11/2016  . Controlled substance agreement broken 10/11/2016  . Low back pain 09/25/2016  . Osteoarthritis of right subtalar joint 07/11/2016  . Posterior tibial tendinitis of right lower extremity 07/11/2016  . Breast cancer screening 06/18/2016  . Knee pain 04/03/2016  . Hyponatremia 03/01/2016  . High triglycerides 12/24/2015  . Pes planus of both feet 11/16/2015  . Plantar fasciitis of right foot 11/16/2015  . Heel spur 11/16/2015  . Diabetic neuropathy ([place]) 11/16/2015  . Right ankle pain 11/16/2015  . Medication monitoring encounter 11/16/2015  . Chronic pain of multiple joints 08/15/2015  . Abnormal mammogram of right breast 06/19/2015  . Cerumen impaction  03/16/2015  . Hyperlipidemia 12/14/2014  . History of transfusion of packed RBC 12/14/2014  . Hx of smoking 12/14/2014  . Status post bariatric surgery 12/14/2014  . Morbid obesity ([place]) 12/14/2014  . Chronic radicular lumbar pain 12/14/2014  . History of GI bleed 11/12/2014  . History of small bowel obstruction 09/07/2014    Nancy Nordmann 05/20/2019, 7:10 PM  [facility] [address], [place], [address] Phone: [phone]   Fax:  [phone]  Name: KINLEY DOZIER MRN: [id number] Date of Birth: 1946/03/26

## 2019-05-24 ENCOUNTER — Encounter: Payer: Self-pay | Admitting: Family Medicine

## 2019-05-24 ENCOUNTER — Other Ambulatory Visit: Payer: Self-pay

## 2019-05-24 ENCOUNTER — Ambulatory Visit (INDEPENDENT_AMBULATORY_CARE_PROVIDER_SITE_OTHER): Payer: Medicare Other | Admitting: Family Medicine

## 2019-05-24 VITALS — BP 108/74 | HR 86 | Temp 97.6°F | Resp 14 | Ht 61.0 in | Wt 201.6 lb

## 2019-05-24 DIAGNOSIS — Z5181 Encounter for therapeutic drug level monitoring: Secondary | ICD-10-CM

## 2019-05-24 DIAGNOSIS — K219 Gastro-esophageal reflux disease without esophagitis: Secondary | ICD-10-CM

## 2019-05-24 DIAGNOSIS — J449 Chronic obstructive pulmonary disease, unspecified: Secondary | ICD-10-CM

## 2019-05-24 DIAGNOSIS — E782 Mixed hyperlipidemia: Secondary | ICD-10-CM

## 2019-05-24 DIAGNOSIS — E1169 Type 2 diabetes mellitus with other specified complication: Secondary | ICD-10-CM

## 2019-05-24 DIAGNOSIS — E669 Obesity, unspecified: Secondary | ICD-10-CM

## 2019-05-24 DIAGNOSIS — D692 Other nonthrombocytopenic purpura: Secondary | ICD-10-CM

## 2019-05-24 DIAGNOSIS — Z789 Other specified health status: Secondary | ICD-10-CM

## 2019-05-24 DIAGNOSIS — I739 Peripheral vascular disease, unspecified: Secondary | ICD-10-CM | POA: Diagnosis not present

## 2019-05-24 DIAGNOSIS — I119 Hypertensive heart disease without heart failure: Secondary | ICD-10-CM

## 2019-05-24 MED ORDER — VARENICLINE TARTRATE 1 MG PO TABS: 1.0000 mg | ORAL_TABLET | Freq: Two times a day (BID) | ORAL | 2 refills | Status: DC

## 2019-05-24 MED ORDER — IPRATROPIUM-ALBUTEROL 0.5-2.5 (3) MG/3ML IN SOLN: 3.0000 mL | Freq: Three times a day (TID) | RESPIRATORY_TRACT | 1 refills | Status: DC | PRN

## 2019-05-24 MED ORDER — NEBULIZER/TUBING/MOUTHPIECE KIT: PACK | 0 refills | Status: DC

## 2019-05-24 MED ORDER — UMECLIDINIUM-VILANTEROL 62.5-25 MCG/INH IN AEPB: 1.0000 | INHALATION_SPRAY | Freq: Every day | RESPIRATORY_TRACT | 2 refills | Status: DC

## 2019-05-24 NOTE — Progress Notes (Signed)
Name: Andrea Holden   MRN: [id number]    DOB: Dec 20, 1945   Date:05/26/2019       Progress Note  Chief Complaint  Patient presents with  . Follow-up  . Hyperlipidemia  . Depression  . Hypertension  . Gastroesophageal Reflux     Subjective:   Andrea Holden is a 73 y.o. female, presents to clinic for routine follow up on the conditions listed above.  Hypertension:  Currently managed on lisinopril has been taking 15 mg  Pt reports good med compliance and denies any SE.   No lightheadedness, hypotension, syncope. Blood pressure today is a little soft - but well controlled. BP Readings from Last 3 Encounters:  05/24/19 108/74  05/10/19 (!) 90/40  04/07/19 (!) 194/70   Pt denies CP, SOB, exertional sx, LE edema, palpitation, Ha's, visual disturbances Dietary efforts for BP?  Healthy balanced   Can monitor at home - usually runs SBP 130-140's over 70's, taking meds, no concerns or SE Cannot do low salt   Hyperlipidemia: Current Medication Regimen:  lipitor 10 mg Last Lipids: Lab Results  Component Value Date   CHOL 135 12/18/2018   HDL 41 (L) 12/18/2018   LDLCALC 70 12/18/2018   TRIG 161 (H) 12/18/2018   CHOLHDL 3.3 12/18/2018   - Current Diet: healthy - Denies: Chest pain, shortness of breath, myalgias. - Documented aortic atherosclerosis? Yes  - Risk factors for atherosclerosis: arteriosclerotic heart disease, cerebral vascular disease, diabetes mellitus, hypercholesterolemia, hypertension, peripheral vascular disease and smoking  On lipitor, compliant, no SE Diet - trying to do keto diet, avoids fried foods fast food  PAD - on plavix doing well, she does hate how easily she bruises  COPD/asthma -  needs inhaler, using it intermittently before bedtime - she has always had URI/wheeze SOB cough (dx of asthma?) allergic to dust, plants, dogs, horses.  Needs refill today. Currently using 3-4 x per week and sometimes once a day (more often since smoking  again) Has required maintenance inhalers in the past, willing to try anoro or nebulizer tx during seasons that her sx are worse.    Current Smoker - wants refill on chantix, will try and quit and cut back.  She has cut back 1/2 ppd maybe a little less, taking chantix and tolerating, moods, good, some weird dreams, but nothing concerning.  GERD:  Well controlled, requests med refill.  Patient Active Problem List   Diagnosis Date Noted  . Osteopenia 03/03/2019  . Lower extremity edema 07/31/2018  . Pressure injury of skin 07/24/2018  . Surgical site infection 07/23/2018  . Wound of left leg 07/23/2018  . Atherosclerosis of artery of extremity with rest pain ([place]) 07/08/2018  . Shortness of breath 06/02/2018  . Bruit 06/02/2018  . Coronary artery disease of native artery of native heart with stable angina pectoris ([place]) 06/02/2018  . Atherosclerosis of native arteries of extremity with intermittent claudication ([place]) 05/19/2018  . Peripheral arterial disease ([place]) 05/06/2018  . Personal history of colonic polyps   . Aortic stenosis 12/24/2017  . LVH (left ventricular hypertrophy) 12/24/2017  . Hypertension with heart disease 12/24/2017  . Left atrial dilatation 12/12/2017  . Cardiac murmur 12/12/2017  . Gastrojejunal ulcer 12/04/2017  . Hyperphosphatemia 12/04/2017  . Spinal stenosis of lumbar region 06/18/2017  . Degeneration of lumbar intervertebral disc 06/02/2017  . Assistance needed with transportation 05/26/2017  . Financial difficulties 05/26/2017  . Needs assistance with community resources 05/26/2017  . Moderate recurrent major depression ([place]) 05/26/2017  .  Non-healing wound of lower extremity 02/06/2017  . Status post ankle arthrodesis 12/11/2016  . Controlled substance agreement broken 10/11/2016  . Low back pain 09/25/2016  . Osteoarthritis of right subtalar joint 07/11/2016  . Posterior tibial tendinitis of right lower extremity 07/11/2016  . Breast cancer  screening 06/18/2016  . Knee pain 04/03/2016  . Hyponatremia 03/01/2016  . High triglycerides 12/24/2015  . Pes planus of both feet 11/16/2015  . Plantar fasciitis of right foot 11/16/2015  . Heel spur 11/16/2015  . Diabetic neuropathy ([place]) 11/16/2015  . Right ankle pain 11/16/2015  . Medication monitoring encounter 11/16/2015  . Chronic pain of multiple joints 08/15/2015  . Abnormal mammogram of right breast 06/19/2015  . Cerumen impaction 03/16/2015  . Hyperlipidemia 12/14/2014  . History of transfusion of packed RBC 12/14/2014  . Hx of smoking 12/14/2014  . Status post bariatric surgery 12/14/2014  . Morbid obesity ([place]) 12/14/2014  . Chronic radicular lumbar pain 12/14/2014  . History of GI bleed 11/12/2014  . History of small bowel obstruction 09/07/2014    Past Surgical History:  Procedure Laterality Date  . ABDOMINAL HYSTERECTOMY    . APPLICATION OF WOUND VAC Left 07/23/2018   Procedure: APPLICATION OF WOUND VAC;  Surgeon: Algernon Huxley, MD;  Location: [facility] ORS;  Service: Vascular;  Laterality: Left;  . banck injections    . CATARACT EXTRACTION W/PHACO Right 05/30/2015   Procedure: CATARACT EXTRACTION PHACO AND INTRAOCULAR LENS PLACEMENT (IOC);  Surgeon: Birder Robson, MD;  Location: [facility] OR[facility];  Service: Ophthalmology;  Laterality: Right;  [place] 00:57 AP% 20.9 CDE 11.99 fluid pack lot #[id number]  . CHOLECYSTECTOMY  1970  . COLON SURGERY  2013   blocked colon  . COLONOSCOPY WITH PROPOFOL N/A 01/20/2018   Procedure: COLONOSCOPY WITH PROPOFOL;  Surgeon: Lucilla Lame, MD;  Location: [facility];  Service: Endoscopy;  Laterality: N/A;  . ENDARTERECTOMY FEMORAL Left 07/08/2018   Procedure: ENDARTERECTOMY FEMORAL;  Surgeon: Algernon Huxley, MD;  Location: [facility] ORS;  Service: Vascular;  Laterality: Left;  . EYE SURGERY Right    cataract surgery  . FOOT FUSION Right 2018   metal in foot  . GALLBLADDER SURGERY  1970  . GASTRIC BYPASS  2010   lost 178 lbs and regained 40 lbs last  few years  . HUMERUS FRACTURE SURGERY Right    metal plate with screws  . internal bleeding  2016   ulcer in past  . LOWER EXTREMITY ANGIOGRAM Left 07/08/2018   Procedure: LOWER EXTREMITY ANGIOGRAM;  Surgeon: Algernon Huxley, MD;  Location: [facility];  Service: Vascular;  Laterality: Left;  . LOWER EXTREMITY ANGIOGRAPHY Left 05/27/2018   Procedure: LOWER EXTREMITY ANGIOGRAPHY;  Surgeon: Algernon Huxley, MD;  Location: [place] [facility];  Service: Cardiovascular;  Laterality: Left;  . LOWER EXTREMITY ANGIOGRAPHY Right 12/07/2018   Procedure: LOWER EXTREMITY ANGIOGRAPHY;  Surgeon: Algernon Huxley, MD;  Location: [place] [facility];  Service: Cardiovascular;  Laterality: Right;  . LOWER EXTREMITY ANGIOGRAPHY Left 05/10/2019   Procedure: LOWER EXTREMITY ANGIOGRAPHY;  Surgeon: Algernon Huxley, MD;  Location: [place] [facility];  Service: Cardiovascular;  Laterality: Left;  . MOUTH SURGERY     root canals and crowns and extractions  . OVARY SURGERY    . SKIN GRAFT Right 2018   RT foot. foot has been rebuilt.  it is full of metal  . TENOTOMY ACHILLES TENDON Right    Percuntaneous. metal in foot  . TONSILLECTOMY    . WOUND DEBRIDEMENT  Left 07/23/2018   Procedure: DEBRIDEMENT WOUND;  Surgeon: Algernon Huxley, MD;  Location: [facility] OR[facility];  Service: Vascular;  Laterality: Left;    Family History  Problem Relation Age of Onset  . Asthma Mother   . COPD Mother   . Arthritis Father   . Depression Father   . Heart disease Father   . Hypertension Father   . Stroke Father   . Heart attack Father   . Arthritis Brother   . Depression Brother   . Diabetes Brother   . Heart disease Brother   . Hyperlipidemia Brother   . Hypertension Brother   . Stroke Brother   . Vision loss Brother   . Heart attack Brother   . Diabetes Maternal Grandmother   . Thyroid disease Daughter   . Colitis Daughter   . Breast cancer Neg Hx     Social History   Socioeconomic History  . Marital status: Divorced    Spouse name:  Not on file  . Number of children: 1  . Years of education: Not on file  . Highest education level: Associate degree: academic program  Occupational History  . Occupation: Oceanographer    Comment: [employer]  Social Needs  . Financial resource strain: Very hard  . Food insecurity    Worry: Never true    Inability: Never true  . Transportation needs    Medical: No    Non-medical: No  Tobacco Use  . Smoking status: Current Some Day Smoker    Packs/day: 0.50    Years: 55.00    Pack years: 27.50    Types: Cigarettes    Start date: 07/01/1960    Last attempt to quit: 11/22/2018    Years since quitting: 0.5  . Smokeless tobacco: Never Used  . Tobacco comment: trying to quit again, using chantix  Substance and Sexual Activity  . Alcohol use: Yes    Alcohol/week: 0.0 standard drinks    Comment: 2 drinks per month  . Drug use: Not Currently    Types: Marijuana    Comment: used for pain  . Sexual activity: Not Currently  Lifestyle  . Physical activity    Days per week: 7 days    Minutes per session: 10 min  . Stress: To some extent  Relationships  . Social Herbalist on phone: Three times a week    Gets together: Once a week    Attends religious service: 1 to 4 times per year    Active member of club or organization: Yes    Attends meetings of clubs or organizations: More than 4 times per year    Relationship status: Divorced  . Intimate partner violence    Fear of current or ex partner: No    Emotionally abused: No    Physically abused: No    Forced sexual activity: No  Other Topics Concern  . Not on file  Social History Narrative  . Not on file     Current Outpatient Medications:  .  acetaminophen (TYLENOL) 325 MG tablet, Take 325 mg by mouth every 6 (six) hours as needed. , Disp: , Rfl:  .  albuterol (VENTOLIN HFA) 108 (90 Base) MCG/ACT inhaler, Inhale 2 puffs into the lungs every 6 (six) hours as needed for wheezing or  shortness of breath., Disp: 18 g, Rfl: 2 .  atorvastatin (LIPITOR) 10 MG tablet, Take 1 tablet (10 mg total) by mouth at bedtime., Disp:  90 tablet, Rfl: 3 .  clopidogrel (PLAVIX) 75 MG tablet, Take 1 tablet (75 mg total) by mouth daily., Disp: 90 tablet, Rfl: 3 .  co-enzyme Q-10 30 MG capsule, Take 100 mg by mouth daily. , Disp: , Rfl:  .  fluticasone (FLONASE) 50 MCG/ACT nasal spray, Place 2 sprays into both nostrils daily as needed., Disp: 16 g, Rfl: 4 .  gabapentin (NEURONTIN) 400 MG capsule, Take 1 capsule (400 mg total) by mouth 2 (two) times daily., Disp: 180 capsule, Rfl: 1 .  HYDROcodone-acetaminophen (NORCO) 5-325 MG tablet, Take 0.5 tablets by mouth every 6 (six) hours as needed for moderate pain., Disp: 30 tablet, Rfl: 0 .  lisinopril (ZESTRIL) 10 MG tablet, TAKE 1 TABLET BY MOUTH ONCE DAILY, Disp: 90 tablet, Rfl: 1 .  Multiple Vitamins-Minerals (MULTIVITAMIN ADULTS 50+) TABS, Take 1 tablet by mouth daily., Disp: 90 tablet, Rfl: 1 .  pantoprazole (PROTONIX) 20 MG tablet, Take 1 tablet (20 mg total) by mouth daily., Disp: 90 tablet, Rfl: 1 .  sertraline (ZOLOFT) 100 MG tablet, TAKE 1 AND 1/2 TABLETS (150 MG TOTAL) BYMOUTH DAILY, Disp: 135 tablet, Rfl: 1 .  Turmeric 500 MG CAPS, Take by mouth., Disp: , Rfl:  .  varenicline (CHANTIX) 1 MG tablet, Take 1 tablet (1 mg total) by mouth 2 (two) times daily., Disp: 60 tablet, Rfl: 2 .  vitamin C (ASCORBIC ACID) 500 MG tablet, Take 500 mg by mouth daily. , Disp: , Rfl:  .  Zinc Sulfate (ZINC 15 PO), Take by mouth., Disp: , Rfl:  .  ipratropium-albuterol (DUONEB) 0.5-2.5 (3) MG/3ML SOLN, Take 3 mLs by nebulization 3 (three) times daily as needed., Disp: 180 mL, Rfl: 1 .  Respiratory Therapy Supplies (NEBULIZER/TUBING/MOUTHPIECE) KIT, Disp one nebulizer machine, tubing set and mouthpiece kit, Disp: 1 kit, Rfl: 0 .  umeclidinium-vilanterol (ANORO ELLIPTA) 62.5-25 MCG/INH AEPB, Inhale 1 puff into the lungs daily., Disp: 60 each, Rfl: 2  Allergies   Allergen Reactions  . Meloxicam Other (See Comments)    GI bleeding  . Nsaids Other (See Comments)    Gi bleeding  . Sitagliptin Hives and Itching    Januvia     I personally reviewed active problem list, medication list, allergies, family history, social history, health maintenance, notes from last encounter, lab results, imaging with the patient/caregiver today.  Review of Systems  Constitutional: Negative.   HENT: Negative.   Eyes: Negative.   Respiratory: Negative.   Cardiovascular: Negative.   Gastrointestinal: Negative.   Endocrine: Negative.   Genitourinary: Negative.   Musculoskeletal: Negative.   Skin: Negative.   Allergic/Immunologic: Negative.   Neurological: Negative.   Hematological: Negative.   Psychiatric/Behavioral: Negative.   All other systems reviewed and are negative.    Objective:    Vitals:   05/24/19 1445  BP: 108/74  Pulse: 86  Resp: 14  Temp: 97.6 F (36.4 C)  SpO2: 96%  Weight: 201 lb 9.6 oz (91.4 kg)  Height: _0  (1.549 m)    Body mass index is 38.09 kg/m.  Physical Exam Vitals signs and nursing note reviewed.  Constitutional:      General: She is not in acute distress.    Appearance: Normal appearance. She is well-developed. She is obese. She is not ill-appearing, toxic-appearing or diaphoretic.     Interventions: Face mask in place.  HENT:     Head: Normocephalic and atraumatic.     Right Ear: External ear normal.     Left Ear: External ear normal.  Eyes:     General: Lids are normal. No scleral icterus.       Right eye: No discharge.        Left eye: No discharge.     Conjunctiva/sclera: Conjunctivae normal.  Neck:     Musculoskeletal: Normal range of motion and neck supple.     Trachea: Phonation normal. No tracheal deviation.  Cardiovascular:     Rate and Rhythm: Normal rate and regular rhythm.     Pulses: Normal pulses.          Radial pulses are 2+ on the right side and 2+ on the left side.       Posterior  tibial pulses are 2+ on the right side and 2+ on the left side.     Heart sounds: Normal heart sounds. No murmur. No friction rub. No gallop.   Pulmonary:     Effort: Pulmonary effort is normal. No respiratory distress.     Breath sounds: Normal breath sounds. No stridor. No wheezing, rhonchi or rales.  Chest:     Chest wall: No tenderness.  Abdominal:     General: Bowel sounds are normal. There is no distension.     Palpations: Abdomen is soft.     Tenderness: There is no abdominal tenderness. There is no guarding or rebound.  Musculoskeletal: Normal range of motion.        General: No deformity.     Right lower leg: No edema.     Left lower leg: No edema.  Lymphadenopathy:     Cervical: No cervical adenopathy.  Skin:    General: Skin is warm and dry.     Capillary Refill: Capillary refill takes less than 2 seconds.     Coloration: Skin is not jaundiced or pale.     Findings: No rash.  Neurological:     Mental Status: She is alert and oriented to person, place, and time.     Motor: No abnormal muscle tone.     Gait: Gait normal.  Psychiatric:        Speech: Speech normal.        Behavior: Behavior normal.      Recent Results (from the past 2160 hour(s))  SARS CORONAVIRUS 2 (TAT 6-24 HRS) Nasopharyngeal Nasopharyngeal Swab     Status: None   Collection Time: 05/06/19  8:28 AM   Specimen: Nasopharyngeal Swab  Result Value Ref Range   SARS Coronavirus 2 NEGATIVE NEGATIVE    Comment: (NOTE) SARS-CoV-2 target nucleic acids are NOT DETECTED. The SARS-CoV-2 RNA is generally detectable in upper and lower respiratory specimens during the acute phase of infection. Negative results do not preclude SARS-CoV-2 infection, do not rule out co-infections with other pathogens, and should not be used as the sole basis for treatment or other patient management decisions. Negative results must be combined with clinical observations, patient history, and epidemiological information. The  expected result is Negative. Fact Sheet for Patients: SugarRoll.be Fact Sheet for Healthcare Providers: https://www.woods-mathews.com/ This test is not yet approved or cleared by the [place] FDA and  has been authorized for detection and/or diagnosis of SARS-CoV-2 by FDA under an Emergency Use Authorization (EUA). This EUA will remain  in effect (meaning this test can be used) for the duration of the COVID-19 declaration under Section 56 4(b)(1) of the Act, 21 U.S.C. section 360bbb-3(b)(1), unless the authorization is terminated or revoked sooner. Performed at [facility], [place] [address], [place], [place] [address]   Glucose, capillary  Status: Abnormal   Collection Time: 05/10/19  7:23 AM  Result Value Ref Range   Glucose-Capillary 140 (H) 70 - 99 mg/dL  BUN     Status: None   Collection Time: 05/10/19  7:26 AM  Result Value Ref Range   BUN 23 8 - 23 mg/dL    Comment: Performed at [facility], [address] [place], [place] [address]  Creatinine, serum     Status: None   Collection Time: 05/10/19  7:26 AM  Result Value Ref Range   Creatinine, Ser 0.78 0.44 - 1.00 mg/dL   GFR calc non Af Amer >60 >60 mL/min   GFR calc Af Amer >60 >60 mL/min    Comment: Performed at [facility], [place], [place] [address]   PHQ2/9: Depression screen [facility] 2/9 05/24/2019 03/11/2019 01/05/2019 12/18/2018 11/19/2018  Decreased Interest 1 3 0 0 0  Down, Depressed, Hopeless - 3 1 0 0  PHQ - 2 Score _0 0 0  Altered sleeping 1 0 0 0 -  Tired, decreased energy 1 0 0 0 -  Change in appetite 0 0 0 0 -  Feeling bad or failure about yourself  0 0 1 0 -  Trouble concentrating 0 0 1 0 -  Moving slowly or fidgety/restless 0 0 1 0 -  Suicidal thoughts 0 0 0 0 -  PHQ-9 Score _1 0 -  Difficult doing work/chores Not difficult at all Not difficult at all Not difficult at all Not difficult at all -  Some recent  data might be hidden    phq 9 is negative, less than 4, reviewed today  Fall Risk: Fall Risk  05/24/2019 03/11/2019 01/05/2019 12/18/2018 11/02/2018  Falls in the past year? 0 _2 Comment - - - - -  Number falls in past yr: 0 _3 Injury with Fall? 0 0 0 0 1  Comment - - - - -  Risk for fall due to : - - - - History of fall(s)  Risk for fall due to: Comment - - - - -  Follow up - - Falls prevention discussed - -    Functional Status Survey: Is the patient deaf or have difficulty hearing?: Yes Does the patient have difficulty seeing, even when wearing glasses/contacts?: Yes Does the patient have difficulty concentrating, remembering, or making decisions?: No Does the patient have difficulty walking or climbing stairs?: No Does the patient have difficulty dressing or bathing?: No Does the patient have difficulty doing errands alone such as visiting a doctor's office or shopping?: No    Assessment & Plan:       ICD-10-CM   1. Hypertension with heart disease  I11.9    BP soft, but pt asx, decrease me to 10 mg daily and monitor, f/up if <100/60 or sx  2. Mixed hyperlipidemia  E78.2    compliant with meds, pt trying to decrease smoking which we discussed would decrease her risk of MI and stroke, check labs  3. Chronic obstructive pulmonary disease, unspecified COPD type ([place])  J44.9 umeclidinium-vilanterol (ANORO ELLIPTA) 62.5-25 MCG/INH AEPB    Respiratory Therapy Supplies (NEBULIZER/TUBING/MOUTHPIECE) KIT    ipratropium-albuterol (DUONEB) 0.5-2.5 (3) MG/3ML SOLN   SABA use is frequent, with allergies and dust - may need maintenence inhaler or duonebs - neb machine and duoneb vials rx'd, anoro rx's also for her to try if she can afford - encouraged to pick  one to use and needs to f/up in 2-3 months on effectiveness.    4. Peripheral arterial disease (HCC)  I73.9    managed by specialist, compliant with meds  5. Gastroesophageal reflux disease without esophagitis  K21.9   6.  Currently attempting to quit using tobacco  Encouraged re her efforts, refill of chantix - some dreams as SE, but no concern with Moods/depression/SI   Z78.9 varenicline (CHANTIX) 1 MG tablet  7. Medication monitoring encounter  Z51.81   8. Senile purpura [facility])  D69.2    age? secondary to plavix tx      [name], PA-C 05/24/19 2:56 PM

## 2019-05-25 ENCOUNTER — Ambulatory Visit: Payer: Medicare Other | Admitting: Physical Therapy

## 2019-05-25 DIAGNOSIS — G8929 Other chronic pain: Secondary | ICD-10-CM

## 2019-05-25 DIAGNOSIS — M6281 Muscle weakness (generalized): Secondary | ICD-10-CM | POA: Diagnosis not present

## 2019-05-25 DIAGNOSIS — R262 Difficulty in walking, not elsewhere classified: Secondary | ICD-10-CM

## 2019-05-25 DIAGNOSIS — Z9181 History of falling: Secondary | ICD-10-CM

## 2019-05-25 NOTE — Therapy (Signed)
[facility] [address], [place], [address] Phone: [phone]   Fax:  [phone]  Physical Therapy Treatment  Patient Details  Name: Andrea Holden MRN: [id number] Date of Birth: Dec 08, 1945 Referring Provider (PT): Poulose, Bethel Born, NP  (Simultaneous filing. User may not have seen previous data.)   Encounter Date: 05/25/2019  PT End of Session - 05/25/19 1405    Visit Number  40    Number of Visits  70    Date for PT Re-Evaluation  06/01/19    Authorization Type  [facility] Medicare reporting period from  03/23/2019    Authorization - Visit Number  5    Authorization - Number of Visits  10    PT Start Time  1400    PT Stop Time  1440    PT Time Calculation (min)  40 min    Equipment Utilized During Treatment  Gait belt    Activity Tolerance  Patient limited by pain;Patient tolerated treatment well;Patient limited by fatigue    Behavior During Therapy  [facility] for tasks assessed/performed       Past Medical History:  Diagnosis Date  . Allergy    seasonal allergies  . Anemia 2014   needed 5 units of blood d/t passing out, weak  . Anxiety   . Aortic stenosis 12/24/2017   Echo Aug 2018  . Arthritis   . Asthma    allergy induced asthma  . Cardiac murmur 12/12/2017  . Cataract   . Cataract    left  . Complication of anesthesia    arrhythmia following colonoscopy  . Degenerative disc disease, lumbar   . Degenerative disc disease, lumbar   . Depression   . Diabetes mellitus   . Diabetic neuropathy ([place]) 11/16/2015  . Dysrhythmia    stenosis of left ventricle  . GERD (gastroesophageal reflux disease)   . H/O transfusion    patient was given 5 units of blood while at [facility], blood type O+  . History of chicken pox   . History of hiatal hernia   . History of measles, mumps, or rubella   . HOH (hard of hearing)    does not use hearing aides yet  . Hyperlipidemia   . Hypertension   . Irregular  heartbeat   . LVH (left ventricular hypertrophy) 12/24/2017   Echo Aug 2018  . Neuropathy   . Opiate use 11/16/2015  . Peripheral arterial disease ([place]) 05/06/2018   At rest, left > right; refer to vasc  . Pes planus of both feet 11/16/2015  . Plantar fasciitis of right foot 11/16/2015  . Wheezing     Past Surgical History:  Procedure Laterality Date  . ABDOMINAL HYSTERECTOMY    . APPLICATION OF WOUND VAC Left 07/23/2018   Procedure: APPLICATION OF WOUND VAC;  Surgeon: Algernon Huxley, MD;  Location: [facility] ORS;  Service: Vascular;  Laterality: Left;  . banck injections    . CATARACT EXTRACTION W/PHACO Right 05/30/2015   Procedure: CATARACT EXTRACTION PHACO AND INTRAOCULAR LENS PLACEMENT (IOC);  Surgeon: Birder Robson, MD;  Location: [facility] ORS;  Service: Ophthalmology;  Laterality: Right;  [place] 00:57 AP% 20.9 CDE 11.99 fluid pack lot #[id number]  . CHOLECYSTECTOMY  1970  . COLON SURGERY  2013   blocked colon  . COLONOSCOPY WITH PROPOFOL N/A 01/20/2018   Procedure: COLONOSCOPY WITH PROPOFOL;  Surgeon: Lucilla Lame, MD;  Location: [facility] ENDOSCOPY;  Service: Endoscopy;  Laterality: N/A;  . ENDARTERECTOMY  FEMORAL Left 07/08/2018   Procedure: ENDARTERECTOMY FEMORAL;  Surgeon: Algernon Huxley, MD;  Location: [facility] ORS;  Service: Vascular;  Laterality: Left;  . EYE SURGERY Right    cataract surgery  . FOOT FUSION Right 2018   metal in foot  . GALLBLADDER SURGERY  1970  . GASTRIC BYPASS  2010   lost 178 lbs and regained 40 lbs last few years  . HUMERUS FRACTURE SURGERY Right    metal plate with screws  . internal bleeding  2016   ulcer in past  . LOWER EXTREMITY ANGIOGRAM Left 07/08/2018   Procedure: LOWER EXTREMITY ANGIOGRAM;  Surgeon: Algernon Huxley, MD;  Location: [facility] ORS;  Service: Vascular;  Laterality: Left;  . LOWER EXTREMITY ANGIOGRAPHY Left 05/27/2018   Procedure: LOWER EXTREMITY ANGIOGRAPHY;  Surgeon: Algernon Huxley, MD;  Location: [facility];  Service: Cardiovascular;  Laterality:  Left;  . LOWER EXTREMITY ANGIOGRAPHY Right 12/07/2018   Procedure: LOWER EXTREMITY ANGIOGRAPHY;  Surgeon: Algernon Huxley, MD;  Location: [facility];  Service: Cardiovascular;  Laterality: Right;  . LOWER EXTREMITY ANGIOGRAPHY Left 05/10/2019   Procedure: LOWER EXTREMITY ANGIOGRAPHY;  Surgeon: Algernon Huxley, MD;  Location: [facility];  Service: Cardiovascular;  Laterality: Left;  . MOUTH SURGERY     root canals and crowns and extractions  . OVARY SURGERY    . SKIN GRAFT Right 2018   RT foot. foot has been rebuilt.  it is full of metal  . TENOTOMY ACHILLES TENDON Right    Percuntaneous. metal in foot  . TONSILLECTOMY    . WOUND DEBRIDEMENT Left 07/23/2018   Procedure: DEBRIDEMENT WOUND;  Surgeon: Algernon Huxley, MD;  Location: [facility] ORS;  Service: Vascular;  Laterality: Left;    There were no vitals filed for this visit.  Subjective Assessment - 05/25/19 1406    Subjective  Patient reports she is feeling pretty good today except some pain in her R knee up to 6/10. She requests not to practice walking today due to her R knee feeling "iffy." She reports she felt good after last treatment session and has no falls since last session. She has been doing a lot of walking lately.    Pertinent History  Patient is a 73 y.o. female who presents to outpatient physical therapy with a referral for medical diagnosis of degeneration of lumbar intervertebral disc. This patient's chief complaints consist of chronic low back pain and foot pain, weakness, stiffness, leading to the following functional deficits: difficulty with with basic ADLs, IADLs, ambulation. She used to get injections to help control back pain but it has recently worsened due to being unable to get injections during [place] pandemic. The patient has been informed of current processes in place at [facility]patient [facility] to protect patients from Covid-19 exposure including social distancing, schedule modifications, and new cleaning procedures.  After discussing their particular risk with a therapist based on the patient's personal risk factors, the patient has decided to proceed with in-person therapy. Pt is scheduled for R LE angiography on 12/07/2018. She has recently started smoking again with the stress of COVID19 pandemic and feels strongly she needs to stop. Surgeon has stated in the past he will not complete surgery if she is smoking.  Relevant past medical history and comorbidities include underwent extensive vascular surgeries in Jan 2020 to improve blood flow, similar surgery for R leg scheduled 12/07/2018. Has a history of left carotid artery stenosis, but not enough to have surgery, has  a history of multiple arterial surgeries, history of R cataract surgery, colon surgery, cholecystectomy, R foot skin graft, ovary surgery, gastric bypass, R foot fusion, abdominal hysterectomy; current smoker, plantar fasciitis, peripheral arterial disease, LVH, diabetes mellitus with neuropathy, irregular heartbeat, hiatal hernia, GERD, stenosis of left heart ventricle, depression, lumbar degenerative disc disease, cardiac murmur, asthma, anxiety, obesity, HTN, and seasonal allergies She has been advised not to lift over 10 pounds by physician    Limitations  Sitting;Standing;Walking;House hold activities;Lifting    How long can you sit comfortably?  > 1 hour in almost any chair    How long can you stand comfortably?  4-5 minutes    How long can you walk comfortably?  2 minutes, depends on what she has with her. Able to walk at fair with rollator. To mailbox 120 feet causes shaking in R leg.      Diagnostic tests  MRI lumbar spine report 06/14/2017: "IMPRESSION: 1. Stable degenerative of lumbar spondylosis and scoliosis with multilevel disc disease and facet disease. 2. Stable right foraminal stenosis at L5-S1 due to right-sided disc    Patient Stated Goals  patient would like to get back to PLOF, walking further, be better able to participate in dog showing  and trips    Currently in Pain?  Yes    Pain Score  6     Pain Onset  More than a month ago       TREATMENT  Therapeutic exercise:to centralize symptoms and improve ROM, strength, muscular endurance, and activity tolerance required for successful completion of functional activities.  Ambulation x 100 feet in from car using single point cane and SBA for safety. Requested by patient. Cuing for upright posture.   NuStep58mnlevel 3 for 6 min and level 2 for 4 min BLEsSeatsetting 8/7. For improved extremity mobility, muscular endurance, and activity tolerance; and to induce the analgesic effect of aerobic exercise, stimulate improved joint nutrition, and prepare body structures and systems for following interventions.during subjective exam.Average SPM =65.    Mat Exercises:   sidelying hip abduction 3x 10 each side manual support to maintain proper form.   Supine bridges 3x10  Supine abdominal brace with active stragiht leg raise. 3X 10 each side.   Prone hip extension 3x 10 each side  Prone TKE 3x10, with manual support under toes to improve comfort.  Cuing for form, assistance with bed mobility, and manual support as needed to achieve proper form.   Discussed  HEP and walking program.    HOME EXERCISE PROGRAM Access Code: [email or web address] URL: [email or web address]  Date: 05/18/2019  Prepared by: SRosita Kea  Exercises  Walking - 1.5 to the mailbox and back - 1.5 2 minutes in the house - 1x daily - 7x weekly  Leg stretch - 10 reps - 3 seconds hold - 2x daily - 7x weekly  Seated Shoulder Flexion with Dumbbells - 2 sets - 5 reps - 1 second hold - 1x daily - 7x weekly  Seated Shoulder Abduction with Dumbbells - Thumbs Up - 2 sets - 5 reps - 1 second hold - 1x daily - 7x weekly  Seated Shoulder Horizontal Abduction with Resistance - 2 sets - 10 reps - 1 second hold - 1x daily - 7x weekly   PT Education - 05/25/19 1449    Education provided  Yes     Education Details  Exercise purpose/form. Self management techniques    Person(s) Educated  Patient    Methods  Explanation;Demonstration;Tactile cues;Verbal cues  Comprehension  Verbalized understanding;Returned demonstration;Verbal cues required;Need further instruction;Tactile cues required       PT Short Term Goals - 04/20/19 1406      PT SHORT TERM GOAL #1   Title  Be independent with initial home exercise program for self-management of symptoms.    Baseline  Initial HEP provided at [facility] (11/17/2018); was completing until vascular procedure, re-instated 12/15/2018 (12/15/2018); currently participating (01/26/2019); Partially doing HEP due the patient's recent health condition/weakness(03/23/2019); Patient has been doing her HEP every other day. 04/20/2019    Time  2    Period  Weeks    Status  Partially Met    Target Date  05/04/19        PT Long Term Goals - 04/20/19 1641      PT LONG TERM GOAL #1   Title  Be independent with a long-term home exercise program for self-management of symptoms.     Baseline  Initial HEP provided at [facility] (11/17/2018); was doing better before vascular surgery, is working at returning to [facility] since procedure (12/15/2018); currently participating but not assigned final long term HEP yet (01/26/2019); currently participating but not assigned final long term HEP yet (03/23/2019); HEP progressing (04/20/2019)    Time  6    Period  Weeks    Status  Partially Met    Target Date  06/01/19      PT LONG TERM GOAL #2   Title  Patient will improve 6MWT to equal or greater than 1000 feet with LRAD to demonstrate improved activity tolerance for community ambulation.     Baseline  6 Minute Walk Test: 200 feet with SPC and supervision. Stopped at 1:43 due to fatigue and leg pain. (11/17/2018); 457 feet SPC, on seated and one standing break (12/15/2018); 547 with SPC, 2 seated and one standing rest break (01/26/2019); 468 feet with SPC, 2 seated breaks(03/23/2019); 638 feet with  SPC, 2 seated breaks    Time  6    Period  Weeks    Status  Partially Met    Target Date  06/01/19      PT LONG TERM GOAL #3   Title  Patient will demonstrate improved 5 Times Sit to Stand test to 11 seconds from chair height with no UE support to demonstrate improvement in LE power and functional strength for daily activities.    Baseline  5 Time Sit to Stand: 27 seconds from chair height no UE support (11/17/2018); 18 seconds, no UE support from chair-high plinth (12/15/2018); 16 seconds with no UE support from chair (01/26/2019); 17.45s with lack of knee extension in standing(03/23/2019); 14.03s without BUEs help (04/20/2019)    Time  6    Period  Weeks    Status  Partially Met    Target Date  06/01/19      PT LONG TERM GOAL #4   Title  Patient will ambulate faster than 1.30ms on the 10 Meter Walk Test to improve ability to cross street safey for community participation.     Baseline  testing deferred to next session (11/17/2018); 1.37 m/sec with SPC (11/24/2018); 1.3 m/sec with SPC (12/15/2018; 01/26/2019); 0.433m (03/23/2019); 0.98 m/sec with SPC (04/20/2019)    Time  6    Period  Weeks    Status  Partially Met    Target Date  06/01/19      PT LONG TERM GOAL #5   Title  Patient will score equal or greater than  25/30 on the Functional Gait Assessment to  demonstrate low fall risk to improve her ability to participate safely in community activities.     Baseline  testing deferred to next session (11/17/2018); 11/30 high fall risk (11/24/2018); unable to measure due to time limitation (12/15/2018); 13/30 high fall risk (12/17/2018); 14/30 high fall risk (01/26/2019); 14/30 high fall risk(03/23/2019); 17/30 high fall risk (04/20/2019)    Time  6    Period  Weeks    Status  Partially Met    Target Date  06/01/19      PT LONG TERM GOAL #6   Title  Reduce pain to equal or less than 4/10 with functional activities to allow patient to complete valued functional tasks such as caring for dogs, grocery  shopping, walking with less difficulty.(03/23/2019);    Baseline  8/10 (01/26/2019); 6/10 (03/23/2019); 4/10 (04/20/2019)    Time  6    Period  Weeks    Status  Partially Met    Target Date  06/01/19            Plan - 05/25/19 1449    Clinical Impression Statement  Patient tolerated treatment well and was able to advance repetitions on hip and trunk stabilization exercises this session. She also demonstrated improved speed and activity tolerance on nustep. Her R knee was bothering her and she requested to avoid extended walking practice today. She was able to complete all exercises with no lasting increase in pain besides some mild discomfort in mid back. She stated she felt like she worked hard today. Patient continues to be limited by poor functional activity tolerance, pain, and weakness. Patient would benefit from continued physical therapy to address remaining impairments and functional limitations to work towards stated goals and return to PLOF or maximal functional independence.    Personal Factors and Comorbidities  Age;Comorbidity 3+;Fitness;Time since onset of injury/illness/exacerbation;Past/Current Experience;Other    Comorbidities  underwent extensive vascular surgeries in Jan 2020 to improve blood flow, similar surgery for R leg scheduled 12/07/2018. Has a history of left carotid artery stenosis, but not enough to have surgery, has a history of multiple arterial surgeries, history of R cataract surgery, colon surgery, cholecystectomy, R foot skin graft, ovary surgery, gastric bypass, R foot fusion, abdominal hysterectomy; current smoker, plantar fasciitis, peripheral arterial disease, LVH, diabetes mellitus with neuropathy, irregular heartbeat, hiatal hernia, GERD, stenosis of left heart ventricle, depression, lumbar degenerative disc disease, cardiac murmur, asthma, anxiety, obesity, HTN, and seasonal allergies She has been advised not to lift over 10 pounds by physician.       Examination-Activity Limitations  Bathing;Carry;Lift;Sit;Stand;Locomotion Level;Toileting;Dressing;Squat;Transfers;Stairs;Hygiene/Grooming;Other    Examination-Participation Restrictions  Community Activity;Volunteer;Cleaning;Interpersonal Relationship;Yard Work;Other    Rehab Potential  Fair    Clinical Impairments Affecting Rehab Potential  (+) motivated (-) muliple co morbidities, chronic condition    PT Frequency  2x / week    PT Duration  12 weeks    PT Treatment/Interventions  Moist Heat;Patient/family education;Neuromuscular re-education;Therapeutic exercise;Manual techniques;ADLs/Self Care Home Management;Electrical Stimulation;Gait training;Stair training;Functional mobility training;Therapeutic activities;Balance training;Passive range of motion;Dry needling;Energy conservation;Spinal Manipulations;Joint Manipulations;Cryotherapy    PT Next Visit Plan  strengthening, pain control, and promotion of self-management techniques    PT Home Exercise Plan  Medbridge Access Code: [email or web address]    Consulted and Agree with Plan of Care  Patient       Patient will benefit from skilled therapeutic intervention in order to improve the following deficits and impairments:  Decreased strength, Impaired flexibility, Decreased activity tolerance, Impaired perceived functional ability, Pain, Decreased endurance, Difficulty walking, Abnormal gait,  Decreased knowledge of use of DME, Decreased skin integrity, Decreased range of motion, Impaired sensation, Improper body mechanics, Obesity, Postural dysfunction, Increased edema, Decreased mobility, Decreased balance, Cardiopulmonary status limiting activity, Decreased coordination  Visit Diagnosis: Muscle weakness (generalized)  Chronic bilateral low back pain, unspecified whether sciatica present  Difficulty in walking, not elsewhere classified  History of falling     Problem List Patient Active Problem List   Diagnosis Date Noted  . Osteopenia  03/03/2019  . Lower extremity edema 07/31/2018  . Pressure injury of skin 07/24/2018  . Surgical site infection 07/23/2018  . Wound of left leg 07/23/2018  . Atherosclerosis of artery of extremity with rest pain ([place]) 07/08/2018  . Shortness of breath 06/02/2018  . Bruit 06/02/2018  . Coronary artery disease of native artery of native heart with stable angina pectoris ([place]) 06/02/2018  . Atherosclerosis of native arteries of extremity with intermittent claudication ([place]) 05/19/2018  . Peripheral arterial disease ([place]) 05/06/2018  . Personal history of colonic polyps   . Aortic stenosis 12/24/2017  . LVH (left ventricular hypertrophy) 12/24/2017  . Hypertension with heart disease 12/24/2017  . Left atrial dilatation 12/12/2017  . Cardiac murmur 12/12/2017  . Gastrojejunal ulcer 12/04/2017  . Hyperphosphatemia 12/04/2017  . Spinal stenosis of lumbar region 06/18/2017  . Degeneration of lumbar intervertebral disc 06/02/2017  . Assistance needed with transportation 05/26/2017  . Financial difficulties 05/26/2017  . Needs assistance with community resources 05/26/2017  . Moderate recurrent major depression ([place]) 05/26/2017  . Non-healing wound of lower extremity 02/06/2017  . Status post ankle arthrodesis 12/11/2016  . Controlled substance agreement broken 10/11/2016  . Low back pain 09/25/2016  . Osteoarthritis of right subtalar joint 07/11/2016  . Posterior tibial tendinitis of right lower extremity 07/11/2016  . Breast cancer screening 06/18/2016  . Knee pain 04/03/2016  . Hyponatremia 03/01/2016  . High triglycerides 12/24/2015  . Pes planus of both feet 11/16/2015  . Plantar fasciitis of right foot 11/16/2015  . Heel spur 11/16/2015  . Diabetic neuropathy ([place]) 11/16/2015  . Right ankle pain 11/16/2015  . Medication monitoring encounter 11/16/2015  . Chronic pain of multiple joints 08/15/2015  . Abnormal mammogram of right breast 06/19/2015  . Cerumen impaction 03/16/2015   . Hyperlipidemia 12/14/2014  . History of transfusion of packed RBC 12/14/2014  . Hx of smoking 12/14/2014  . Status post bariatric surgery 12/14/2014  . Morbid obesity ([place]) 12/14/2014  . Chronic radicular lumbar pain 12/14/2014  . History of GI bleed 11/12/2014  . History of small bowel obstruction 09/07/2014    Everlean Alstrom. [name], PT, DPT 05/25/19, 2:50 PM  [facility] [address] [place], [address] Phone: [phone]   Fax:  [phone]  Name: Andrea Holden MRN: [id number] Date of Birth: [birth year]

## 2019-05-26 ENCOUNTER — Encounter: Payer: Self-pay | Admitting: Family Medicine

## 2019-05-31 ENCOUNTER — Other Ambulatory Visit: Payer: Self-pay

## 2019-05-31 ENCOUNTER — Encounter: Payer: Self-pay | Admitting: Physical Therapy

## 2019-05-31 ENCOUNTER — Ambulatory Visit: Payer: Medicare Other | Admitting: Physical Therapy

## 2019-05-31 DIAGNOSIS — Z9181 History of falling: Secondary | ICD-10-CM

## 2019-05-31 DIAGNOSIS — M6281 Muscle weakness (generalized): Secondary | ICD-10-CM

## 2019-05-31 DIAGNOSIS — R262 Difficulty in walking, not elsewhere classified: Secondary | ICD-10-CM

## 2019-05-31 DIAGNOSIS — M545 Low back pain, unspecified: Secondary | ICD-10-CM

## 2019-05-31 DIAGNOSIS — G8929 Other chronic pain: Secondary | ICD-10-CM

## 2019-05-31 NOTE — Therapy (Addendum)
[facility] [address], [place], [address] Phone: [phone]   Fax:  [phone]  Physical Therapy Treatment / Progress Note / Re-Certification Reporting period: 04/20/2019 - 05/31/2019  Patient Details  Name: Andrea Holden MRN: [id number] Date of Birth: 1946/02/25 Referring Provider (PT): Fredderick Severance, NP    Encounter Date: 05/31/2019  PT End of Session - 05/31/19 1126    Visit Number  41    Number of Visits  65    Date for PT Re-Evaluation  08/23/19    Authorization Type  [facility] Medicare reporting period from  04/20/2019 (new reporting period from 05/31/2019)    Authorization - Visit Number  1    Authorization - Number of Visits  10    PT Start Time  1125    PT Stop Time  1210    PT Time Calculation (min)  45 min    Equipment Utilized During Treatment  Gait belt    Activity Tolerance  Patient limited by pain;Patient tolerated treatment well;Patient limited by fatigue    Behavior During Therapy  [facility] for tasks assessed/performed       Past Medical History:  Diagnosis Date  . Allergy    seasonal allergies  . Anemia 2014   needed 5 units of blood d/t passing out, weak  . Anxiety   . Aortic stenosis 12/24/2017   Echo Aug 2018  . Arthritis   . Asthma    allergy induced asthma  . Cardiac murmur 12/12/2017  . Cataract   . Cataract    left  . Complication of anesthesia    arrhythmia following colonoscopy  . Degenerative disc disease, lumbar   . Degenerative disc disease, lumbar   . Depression   . Diabetes mellitus   . Diabetic neuropathy ([place]) 11/16/2015  . Dysrhythmia    stenosis of left ventricle  . GERD (gastroesophageal reflux disease)   . H/O transfusion    patient was given 5 units of blood while at [place], blood type O+  . History of chicken pox   . History of hiatal hernia   . History of measles, mumps, or rubella   . HOH (hard of hearing)    does not use hearing aides  yet  . Hyperlipidemia   . Hypertension   . Irregular heartbeat   . LVH (left ventricular hypertrophy) 12/24/2017   Echo Aug 2018  . Neuropathy   . Opiate use 11/16/2015  . Peripheral arterial disease ([place]) 05/06/2018   At rest, left > right; refer to vasc  . Pes planus of both feet 11/16/2015  . Plantar fasciitis of right foot 11/16/2015  . Wheezing     Past Surgical History:  Procedure Laterality Date  . ABDOMINAL HYSTERECTOMY    . APPLICATION OF WOUND VAC Left 07/23/2018   Procedure: APPLICATION OF WOUND VAC;  Surgeon: Algernon Huxley, MD;  Location: [facility] ORS;  Service: Vascular;  Laterality: Left;  . banck injections    . CATARACT EXTRACTION W/PHACO Right 05/30/2015   Procedure: CATARACT EXTRACTION PHACO AND INTRAOCULAR LENS PLACEMENT (IOC);  Surgeon: Birder Robson, MD;  Location: [facility] ORS;  Service: Ophthalmology;  Laterality: Right;  [place] 00:57 AP% 20.9 CDE 11.99 fluid pack lot #[id number]  . CHOLECYSTECTOMY  1970  . COLON SURGERY  2013   blocked colon  . COLONOSCOPY WITH PROPOFOL N/A 01/20/2018   Procedure: COLONOSCOPY WITH PROPOFOL;  Surgeon: Lucilla Lame, MD;  Location: [facility] ENDOSCOPY;  Service: Endoscopy;  Laterality: N/A;  . ENDARTERECTOMY FEMORAL Left 07/08/2018   Procedure: ENDARTERECTOMY FEMORAL;  Surgeon: Algernon Huxley, MD;  Location: [facility] ORS;  Service: Vascular;  Laterality: Left;  . EYE SURGERY Right    cataract surgery  . FOOT FUSION Right 2018   metal in foot  . GALLBLADDER SURGERY  1970  . GASTRIC BYPASS  2010   lost 178 lbs and regained 40 lbs last few years  . HUMERUS FRACTURE SURGERY Right    metal plate with screws  . internal bleeding  2016   ulcer in past  . LOWER EXTREMITY ANGIOGRAM Left 07/08/2018   Procedure: LOWER EXTREMITY ANGIOGRAM;  Surgeon: Algernon Huxley, MD;  Location: [facility] ORS;  Service: Vascular;  Laterality: Left;  . LOWER EXTREMITY ANGIOGRAPHY Left 05/27/2018   Procedure: LOWER EXTREMITY ANGIOGRAPHY;  Surgeon: Algernon Huxley, MD;  Location: [place] CV LAB;  Service: Cardiovascular;  Laterality: Left;  . LOWER EXTREMITY ANGIOGRAPHY Right 12/07/2018   Procedure: LOWER EXTREMITY ANGIOGRAPHY;  Surgeon: Algernon Huxley, MD;  Location: [place] [facility];  Service: Cardiovascular;  Laterality: Right;  . LOWER EXTREMITY ANGIOGRAPHY Left 05/10/2019   Procedure: LOWER EXTREMITY ANGIOGRAPHY;  Surgeon: Algernon Huxley, MD;  Location: [place] [facility];  Service: Cardiovascular;  Laterality: Left;  . MOUTH SURGERY     root canals and crowns and extractions  . OVARY SURGERY    . SKIN GRAFT Right 2018   RT foot. foot has been rebuilt.  it is full of metal  . TENOTOMY ACHILLES TENDON Right    Percuntaneous. metal in foot  . TONSILLECTOMY    . WOUND DEBRIDEMENT Left 07/23/2018   Procedure: DEBRIDEMENT WOUND;  Surgeon: Algernon Huxley, MD;  Location: [facility] ORS;  Service: Vascular;  Laterality: Left;    There were no vitals filed for this visit.  Subjective Assessment - 05/31/19 1128    Subjective  Patient state she feels like physical therapy is helping. She is getting around her home better and able to take care of dogs better. She is sore all over today after putting up a lot of Christmas decorations over the last two days. She has been lifting boxes from the floor. She states she does not think she could have done this if she had not been coming to physical therapy. She reports mild pain in her low back and R knee. Her R knee continues to buckle on her. She has not had any falls recently. She has been able to take her one step to her pantry items in the top shelf but she does it very carefully.    Pertinent History  Patient is a 73 y.o. female who presents to outpatient physical therapy with a referral for medical diagnosis of degeneration of lumbar intervertebral disc. This patient's chief complaints consist of chronic low back pain and foot pain, weakness, stiffness, leading to the following functional deficits: difficulty with with basic ADLs, IADLs,  ambulation. She used to get injections to help control back pain but it has recently worsened due to being unable to get injections during [place] pandemic. The patient has been informed of current processes in place at [facility]patient [facility] to protect patients from Covid-19 exposure including social distancing, schedule modifications, and new cleaning procedures. After discussing their particular risk with a therapist based on the patient's personal risk factors, the patient has decided to proceed with in-person therapy. Pt is scheduled for R LE angiography on 12/07/2018. She has recently started smoking again with  the stress of COVID19 pandemic and feels strongly she needs to stop. Surgeon has stated in the past he will not complete surgery if she is smoking.  Relevant past medical history and comorbidities include underwent extensive vascular surgeries in Jan 2020 to improve blood flow, similar surgery for R leg scheduled 12/07/2018. Has a history of left carotid artery stenosis, but not enough to have surgery, has a history of multiple arterial surgeries, history of R cataract surgery, colon surgery, cholecystectomy, R foot skin graft, ovary surgery, gastric bypass, R foot fusion, abdominal hysterectomy; current smoker, plantar fasciitis, peripheral arterial disease, LVH, diabetes mellitus with neuropathy, irregular heartbeat, hiatal hernia, GERD, stenosis of left heart ventricle, depression, lumbar degenerative disc disease, cardiac murmur, asthma, anxiety, obesity, HTN, and seasonal allergies She has been advised not to lift over 10 pounds by physician    Limitations  Sitting;Standing;Walking;House hold activities;Lifting    How long can you sit comfortably?  > 1 hour in almost any chair    How long can you stand comfortably?  10-15 min while cooking    How long can you walk comfortably?  2 times to the mailbox and back (120 feet to mailbox) with single point cane. Can ambulate 1 hour with rollator with seated  resting breaks.    Diagnostic tests  MRI lumbar spine report 06/14/2017: "IMPRESSION: 1. Stable degenerative of lumbar spondylosis and scoliosis with multilevel disc disease and facet disease. 2. Stable right foraminal stenosis at L5-S1 due to right-sided disc    Patient Stated Goals  patient would like to get back to PLOF, walking further, be better able to participate in dog showing and trips    Currently in Pain?  Yes    Pain Score  3     Pain Location  Back    Pain Orientation  Right;Left    Pain Descriptors / Indicators  Aching    Pain Type  Chronic pain    Pain Radiating Towards  bilateral hips    Pain Onset  More than a month ago    Pain Frequency  Constant    Aggravating Factors   feels like when walking legs are losing oxygen, walking, stairs, standing, bending, lifting    Pain Relieving Factors  oxycodone, sitting    Effect of Pain on Daily Activities  difficulty with all daily tasks involving standing, walking, sitting, unable to show dogs, difficulty moving chairs at dog shows, getting down on one knee, walk outside, walk dogs (improving walking distance, ability to lift and move items around.    Multiple Pain Sites  Yes    Pain Score  5    Pain Location  Knee        [facility] PT Assessment - 05/31/19 0001      Assessment   Medical Diagnosis  Degeneration of lumbar intervertebral disc    Referring Provider (PT)  Poulose, Bethel Born, NP     Hand Dominance  Right    Prior Therapy  participated several times with improved function.       Precautions   Precautions  Other (comment)   no lifting over 10#     Restrictions   Weight Bearing Restrictions  No      Home Environment   Living Environment  Private residence    [place]   with two dogs (Shit tzu and Magazine features editor).    Type of [place] to enter    Entrance Stairs-Number  of Steps  3    Entrance Stairs-Rails  Right;Left;Can reach both    Home Layout  One level    Home  Equipment  Tub bench;Bedside commode;Other (comment);Cane - single point;Walker - 2 wheels;Grab bars - tub/shower;Grab bars - toilet   rollator     Prior Function   Level of Independence  Independent    Vocation  Part time employment   currently closed due to [place] 19 pandemic   Vocation Requirements  substitute teaching    Leisure  life revolves around dog showing (comfirmation and obedience), currently unable to do       Cognition   Overall Cognitive Status  Within Functional Limits for tasks assessed      Observation/Other Assessments   Observations  see note from 05/31/2019 for latest objective data    Focus on Therapeutic Outcomes (FOTO)   44    Oswestry Disability Index   --      6 minute walk test results    Aerobic Endurance Distance Walked  747   SPC, 2 standing breaks      Standardized Balance Assessment   Five times sit to stand comments   16 seconds without UE help    10 Meter Walk  80ms with SPC      Functional Gait  Assessment   Gait Level Surface  Walks 20 ft in less than 7 sec but greater than 5.5 sec, uses assistive device, slower speed, mild gait deviations, or deviates 6-10 in outside of the 12 in walkway width.    Change in Gait Speed  Able to change speed, demonstrates mild gait deviations, deviates 6-10 in outside of the 12 in walkway width, or no gait deviations, unable to achieve a major change in velocity, or uses a change in velocity, or uses an assistive device.    Gait with Horizontal Head Turns  Performs head turns smoothly with no change in gait. Deviates no more than 6 in outside 12 in walkway width    Gait with Vertical Head Turns  Performs head turns with no change in gait. Deviates no more than 6 in outside 12 in walkway width.    Gait and Pivot Turn  Pivot turns safely within 3 sec and stops quickly with no loss of balance.    Step Over Obstacle  Is able to step over one shoe box (4.5 in total height) but must slow down and adjust steps to clear box  safely. May require verbal cueing.    Gait with Narrow Base of Support  Ambulates 7-9 steps.   with SPC; able to take 7 steps with SPC gently resting on floor   Gait with Eyes Closed  Walks 20 ft, slow speed, abnormal gait pattern, evidence for imbalance, deviates 10-15 in outside 12 in walkway width. Requires more than 9 sec to ambulate 20 ft.    Ambulating Backwards  Walks 20 ft, uses assistive device, slower speed, mild gait deviations, deviates 6-10 in outside 12 in walkway width.    Steps  Alternating feet, must use rail.    Total Score  21    FGA comment:  19-24 = medium risk fall   last measured 01/26/2019, used SPC and SBA-CGA for safety      TREATMENT:  Therapeutic exercise:to centralize symptoms and improve ROM, strength, muscular endurance, and activity tolerance required for successful completion of functional activities. - Time to fill out FOTO (unbilled time x 5 minutes).  Functional testing to assess progress and improve  functional endurance, strength, balance, and power to improve ability to complete ADLs. Required SBA- CGA for safety. Required cuing for directions and encouragement to continue.   - 6MWT:751fet SPC, 2standing breaks - 5TSTS:16seconds, no UE support from 18.5 inch chair. Two attempts. Limited by R knee pain/instabilty.  Lost balance backwards chair 4th rep first set.  - 10MWT =169mwith SPC - FGA:21/30 - Education on diagnosis, prognosis, POC, anatomy and physiology of current condition. - updated HEP to go 2.5 times to mailbox and back per day.   Patient required frequent extended seated rest breaks due to faStanhopend redirection to stay on task due to easy distraction. Required SBA - CGA for standing activities due to fall risk.    PT Education - 05/31/19 1135    Education provided  Yes    Education Details  Exercise purpose/form. Self management techniques. POC. HEP    Person(s) Educated  Patient    Methods   Explanation;Demonstration    Comprehension  Verbalized understanding;Returned demonstration;Verbal cues required;Tactile cues required;Need further instruction       PT Short Term Goals - 05/31/19 2104      PT SHORT TERM GOAL #1   Title  Be independent with initial home exercise program for self-management of symptoms.    Baseline  Initial HEP provided at IE (11/17/2018); was completing until vascular procedure, re-instated 12/15/2018 (12/15/2018); currently participating (01/26/2019); Partially doing HEP due the patient's recent health condition/weakness(03/23/2019); Patient has been doing her HEP every other day. 04/20/2019; has returned to completing her walking HEP as prescribed (05/31/2019);    Time  2    Period  Weeks    Status  Achieved    Target Date  05/04/19        PT Long Term Goals - 05/31/19 2104      PT LONG TERM GOAL #1   Title  Be independent with a long-term home exercise program for self-management of symptoms.     Baseline  Initial HEP provided at IE (11/17/2018); was doing better before vascular surgery, [facility] currently participating but not assigned final long term HEP yet (01/26/2019); currently participating but not assigned final long term HEP yet (03/23/2019); HEP progressing (04/20/2019; 05/31/2019);    Time  12    Period  Weeks    Status  Partially Met    Target Date  08/23/19      PT LONG TERM GOAL #2   Title  Patient will improve 6MWT to equal or greater than 1000 feet with LRAD to demonstrate improved activity tolerance for community ambulation.     Baseline  6 Minute Walk Test: 200 feet with SPC and supervision. Stopped at 1:43 due to fatigue and leg pain. (11/17/2018); 457 feet SPC, on seated and one standing break (12/15/2018); 547 with SPC, 2 seated and one standing rest break (01/26/2019); 468 feet with SPC, 2 seated breaks(03/23/2019); 638 feet with SPC, 2 seated breaks; 747 feet with SPC and 2 standing  rest breaks (05/31/2019);    Time  12    Period  Weeks    Status  Partially Met    Target Date  08/23/19      PT LONG TERM GOAL #3   Title  Patient will demonstrate improved 5 Times Sit to Stand test to 11 seconds from chair height with no UE support to demonstrate improvement in LE power and functional strength for daily activities.    Baseline  5 Time Sit to Stand:  27 seconds from chair height no UE support (11/17/2018); 18 seconds, no UE support from chair-high plinth (12/15/2018); 16 seconds with no UE support from chair (01/26/2019); 17.45s with lack of knee extension in standing(03/23/2019); 14.03s without BUEs help (04/20/2019); 16 seconds without BUE help from 18.5 inch seat (05/31/2019);    Time  12    Period  Weeks    Status  Partially Met    Target Date  08/23/19      PT LONG TERM GOAL #4   Title  Patient will ambulate faster than 1.75ms on the 10 Meter Walk Test to improve ability to cross street safey for community participation(05/21/2019); 275m with SPC (05/31/2019);    Baseline  testing deferred to next session (11/17/2018); 1.37 m/sec with SPC (11/24/2018); 1.3 m/sec with SPC (12/15/2018; 01/26/2019); 0.4732m(03/23/2019); 0.98 m/sec with SPC (04/20/2019)    Time  6    Period  Weeks    Status  Achieved    Target Date  06/01/19      PT LONG TERM GOAL #5   Title  Patient will score equal or greater than  25/30 on the Functional Gait Assessment to demonstrate low fall risk to improve her ability to participate safely in community activities.     Baseline  testing deferred to next session (11/17/2018); 11/30 high fall risk (11/24/2018); unable to measure due to time limitation (12/15/2018); 13/30 high fall risk (12/17/2018); 14/30 high fall risk (01/26/2019); 14/30 high fall risk(03/23/2019); 17/30 high fall risk (04/20/2019); 21/30 moderate fall risk (05/31/2019);    Time  12    Period  Weeks    Status  Partially Met    Target Date  08/23/19      PT LONG TERM GOAL #6   Title  Reduce  pain to equal or less than 4/10 with functional activities to allow patient to complete valued functional tasks such as caring for dogs, grocery shopping, walking with less difficulty.(03/23/2019);    Baseline  8/10 (01/26/2019); 6/10 (03/23/2019); 4/10 (04/20/2019); 4/10 (05/31/2019);    Time  12    Period  Weeks    Status  Partially Met    Target Date  08/23/19            Plan - 05/31/19 2103    Clinical Impression Statement  Patient has attended 41 11illed physical therapy treatment sessions this episode of care and overall presents with good progress towards stated goals since last progress assessment. Subjectively, patient reports that she can do more than before and was able to do a lot of lifting boxes and decorating yesterday. She states that she feels she could not do this if she had not been completing physical therapy regularly. Objectively, patient demonstrates good progression in walking distance (6MWT), walking speed (10MWT), and balance (FGA). Patient continues to learn and improve skills for self-care strategies for pain control and increased activity tolerance. Though patient demonstrated great gain from previous therapy sessions, patient continues to report pain and weakness in her low back and right knee and presents with significant strength, ROM, balance, pain, activity tolerance, moderate  fall risk impairments that are limiting ability to complete her usual ADLs, IADLs, community participation, care for her dogs, manage her home, bend, lift, ambulate community distances and complete transfers without difficulty or falling. Patient will benefit from continued skilled physical therapy intervention to address current body structure impairments and activity limitations to improve function and work towards goals set in current POC in order to return to prior level of  function or maximal functional improvement.    Personal Factors and Comorbidities  Age;Comorbidity 3+;Fitness;Time  since onset of injury/illness/exacerbation;Past/Current Experience;Other    Comorbidities  underwent extensive vascular surgeries in Jan 2020 to improve blood flow, similar surgery for R leg scheduled 12/07/2018. Has a history of left carotid artery stenosis, but not enough to have surgery, has a history of multiple arterial surgeries, history of R cataract surgery, colon surgery, cholecystectomy, R foot skin graft, ovary surgery, gastric bypass, R foot fusion, abdominal hysterectomy; current smoker, plantar fasciitis, peripheral arterial disease, LVH, diabetes mellitus with neuropathy, irregular heartbeat, hiatal hernia, GERD, stenosis of left heart ventricle, depression, lumbar degenerative disc disease, cardiac murmur, asthma, anxiety, obesity, HTN, and seasonal allergies She has been advised not to lift over 10 pounds by physician.      Examination-Activity Limitations  Bathing;Carry;Lift;Sit;Stand;Locomotion Level;Toileting;Dressing;Squat;Transfers;Stairs;Hygiene/Grooming;Other    Examination-Participation Restrictions  Community Activity;Volunteer;Cleaning;Interpersonal Relationship;Yard Work;Other    Rehab Potential  Fair    Clinical Impairments Affecting Rehab Potential  (+) motivated (-) muliple co morbidities, chronic condition    PT Frequency  2x / week    PT Duration  12 weeks    PT Treatment/Interventions  Moist Heat;Patient/family education;Neuromuscular re-education;Therapeutic exercise;Manual techniques;ADLs/Self Care Home Management;Electrical Stimulation;Gait training;Stair training;Functional mobility training;Therapeutic activities;Balance training;Passive range of motion;Dry needling;Energy conservation;Spinal Manipulations;Joint Manipulations;Cryotherapy    PT Next Visit Plan  strengthening, pain control, and promotion of self-management techniques    PT Home Exercise Plan  Medbridge Access Code: [email or web address]    Consulted and Agree with Plan of Care  Patient       Patient will benefit  from skilled therapeutic intervention in order to improve the following deficits and impairments:  Decreased strength, Impaired flexibility, Decreased activity tolerance, Impaired perceived functional ability, Pain, Decreased endurance, Difficulty walking, Abnormal gait, Decreased knowledge of use of DME, Decreased skin integrity, Decreased range of motion, Impaired sensation, Improper body mechanics, Obesity, Postural dysfunction, Increased edema, Decreased mobility, Decreased balance, Cardiopulmonary status limiting activity, Decreased coordination  Visit Diagnosis: Muscle weakness (generalized)  Chronic bilateral low back pain, unspecified whether sciatica present  Difficulty in walking, not elsewhere classified  History of falling     Problem List Patient Active Problem List   Diagnosis Date Noted  . Osteopenia 03/03/2019  . Lower extremity edema 07/31/2018  . Pressure injury of skin 07/24/2018  . Surgical site infection 07/23/2018  . Wound of left leg 07/23/2018  . Atherosclerosis of artery of extremity with rest pain ([place]) 07/08/2018  . Shortness of breath 06/02/2018  . Bruit 06/02/2018  . Coronary artery disease of native artery of native heart with stable angina pectoris ([place]) 06/02/2018  . Atherosclerosis of native arteries of extremity with intermittent claudication ([place]) 05/19/2018  . Peripheral arterial disease ([place]) 05/06/2018  . Personal history of colonic polyps   . Aortic stenosis 12/24/2017  . LVH (left ventricular hypertrophy) 12/24/2017  . Hypertension with heart disease 12/24/2017  . Left atrial dilatation 12/12/2017  . Cardiac murmur 12/12/2017  . Gastrojejunal ulcer 12/04/2017  . Hyperphosphatemia 12/04/2017  . Spinal stenosis of lumbar region 06/18/2017  . Degeneration of lumbar intervertebral disc 06/02/2017  . Assistance needed with transportation 05/26/2017  . Financial difficulties 05/26/2017  . Needs assistance with community resources 05/26/2017   . Moderate recurrent major depression ([place]) 05/26/2017  . Non-healing wound of lower extremity 02/06/2017  . Status post ankle arthrodesis 12/11/2016  . Controlled substance agreement broken 10/11/2016  . Low back pain 09/25/2016  . Osteoarthritis of right subtalar joint 07/11/2016  .  Posterior tibial tendinitis of right lower extremity 07/11/2016  . Breast cancer screening 06/18/2016  . Knee pain 04/03/2016  . Hyponatremia 03/01/2016  . High triglycerides 12/24/2015  . Pes planus of both feet 11/16/2015  . Plantar fasciitis of right foot 11/16/2015  . Heel spur 11/16/2015  . Diabetic neuropathy ([place]) 11/16/2015  . Right ankle pain 11/16/2015  . Medication monitoring encounter 11/16/2015  . Chronic pain of multiple joints 08/15/2015  . Abnormal mammogram of right breast 06/19/2015  . Cerumen impaction 03/16/2015  . Hyperlipidemia 12/14/2014  . History of transfusion of packed RBC 12/14/2014  . Hx of smoking 12/14/2014  . Status post bariatric surgery 12/14/2014  . Morbid obesity ([place]) 12/14/2014  . Chronic radicular lumbar pain 12/14/2014  . History of GI bleed 11/12/2014  . History of small bowel obstruction 09/07/2014    Everlean Alstrom. [name], PT, DPT 05/31/19, 9:10 PM  [facility] [address], [place], [address] Phone: [phone]   Fax:  [phone]  Name: Andrea Holden MRN: [id number] Date of Birth: [birth year]

## 2019-06-02 ENCOUNTER — Ambulatory Visit: Payer: Medicare Other | Attending: Family Medicine | Admitting: Physical Therapy

## 2019-06-02 ENCOUNTER — Encounter: Payer: Self-pay | Admitting: Physical Therapy

## 2019-06-02 ENCOUNTER — Other Ambulatory Visit: Payer: Self-pay

## 2019-06-02 DIAGNOSIS — G8929 Other chronic pain: Secondary | ICD-10-CM

## 2019-06-02 DIAGNOSIS — Z9181 History of falling: Secondary | ICD-10-CM

## 2019-06-02 DIAGNOSIS — R262 Difficulty in walking, not elsewhere classified: Secondary | ICD-10-CM | POA: Diagnosis present

## 2019-06-02 DIAGNOSIS — M6281 Muscle weakness (generalized): Secondary | ICD-10-CM

## 2019-06-02 DIAGNOSIS — M545 Low back pain: Secondary | ICD-10-CM | POA: Insufficient documentation

## 2019-06-02 NOTE — Therapy (Signed)
[facility] [address], [place], [address] Phone: [phone]   Fax:  [phone]  Physical Therapy Treatment  Patient Details  Name: Andrea Holden MRN: [id number] Date of Birth: June 15, 1946 Referring Provider (PT): Fredderick Severance, NP    Encounter Date: 06/02/2019  PT End of Session - 06/02/19 1031    Visit Number  42    Number of Visits  65    Date for PT Re-Evaluation  08/23/19    Authorization Type  [facility] Medicare reporting period from  04/20/2019 (new reporting period from 05/31/2019)    Authorization - Visit Number  2    Authorization - Number of Visits  10    PT Start Time  2220    PT Stop Time  2300    PT Time Calculation (min)  40 min    Equipment Utilized During Treatment  Gait belt    Activity Tolerance  Patient limited by pain;Patient tolerated treatment well;Patient limited by fatigue    Behavior During Therapy  [facility] for tasks assessed/performed       Past Medical History:  Diagnosis Date  . Allergy    seasonal allergies  . Anemia 2014   needed 5 units of blood d/t passing out, weak  . Anxiety   . Aortic stenosis 12/24/2017   Echo Aug 2018  . Arthritis   . Asthma    allergy induced asthma  . Cardiac murmur 12/12/2017  . Cataract   . Cataract    left  . Complication of anesthesia    arrhythmia following colonoscopy  . Degenerative disc disease, lumbar   . Degenerative disc disease, lumbar   . Depression   . Diabetes mellitus   . Diabetic neuropathy ([place]) 11/16/2015  . Dysrhythmia    stenosis of left ventricle  . GERD (gastroesophageal reflux disease)   . H/O transfusion    patient was given 5 units of blood while at [place], blood type O+  . History of chicken pox   . History of hiatal hernia   . History of measles, mumps, or rubella   . HOH (hard of hearing)    does not use hearing aides yet  . Hyperlipidemia   . Hypertension   . Irregular heartbeat   . LVH  (left ventricular hypertrophy) 12/24/2017   Echo Aug 2018  . Neuropathy   . Opiate use 11/16/2015  . Peripheral arterial disease ([place]) 05/06/2018   At rest, left > right; refer to vasc  . Pes planus of both feet 11/16/2015  . Plantar fasciitis of right foot 11/16/2015  . Wheezing     Past Surgical History:  Procedure Laterality Date  . ABDOMINAL HYSTERECTOMY    . APPLICATION OF WOUND VAC Left 07/23/2018   Procedure: APPLICATION OF WOUND VAC;  Surgeon: Algernon Huxley, MD;  Location: [facility] ORS;  Service: Vascular;  Laterality: Left;  . banck injections    . CATARACT EXTRACTION W/PHACO Right 05/30/2015   Procedure: CATARACT EXTRACTION PHACO AND INTRAOCULAR LENS PLACEMENT (IOC);  Surgeon: Birder Robson, MD;  Location: [facility] ORS;  Service: Ophthalmology;  Laterality: Right;  [place] 00:57 AP% 20.9 CDE 11.99 fluid pack lot #[id number]  . CHOLECYSTECTOMY  1970  . COLON SURGERY  2013   blocked colon  . COLONOSCOPY WITH PROPOFOL N/A 01/20/2018   Procedure: COLONOSCOPY WITH PROPOFOL;  Surgeon: Lucilla Lame, MD;  Location: [facility] ENDOSCOPY;  Service: Endoscopy;  Laterality: N/A;  . ENDARTERECTOMY FEMORAL Left 07/08/2018  Procedure: ENDARTERECTOMY FEMORAL;  Surgeon: Algernon Huxley, MD;  Location: [facility] ORS;  Service: Vascular;  Laterality: Left;  . EYE SURGERY Right    cataract surgery  . FOOT FUSION Right 2018   metal in foot  . GALLBLADDER SURGERY  1970  . GASTRIC BYPASS  2010   lost 178 lbs and regained 40 lbs last few years  . HUMERUS FRACTURE SURGERY Right    metal plate with screws  . internal bleeding  2016   ulcer in past  . LOWER EXTREMITY ANGIOGRAM Left 07/08/2018   Procedure: LOWER EXTREMITY ANGIOGRAM;  Surgeon: Algernon Huxley, MD;  Location: [facility] ORS;  Service: Vascular;  Laterality: Left;  . LOWER EXTREMITY ANGIOGRAPHY Left 05/27/2018   Procedure: LOWER EXTREMITY ANGIOGRAPHY;  Surgeon: Algernon Huxley, MD;  Location: [facility];  Service: Cardiovascular;  Laterality: Left;  . LOWER  EXTREMITY ANGIOGRAPHY Right 12/07/2018   Procedure: LOWER EXTREMITY ANGIOGRAPHY;  Surgeon: Algernon Huxley, MD;  Location:  CV LAB;  Service: Cardiovascular;  Laterality: Right;  . LOWER EXTREMITY ANGIOGRAPHY Left 05/10/2019   Procedure: LOWER EXTREMITY ANGIOGRAPHY;  Surgeon: Algernon Huxley, MD;  Location: [facility];  Service: Cardiovascular;  Laterality: Left;  . MOUTH SURGERY     root canals and crowns and extractions  . OVARY SURGERY    . SKIN GRAFT Right 2018   RT foot. foot has been rebuilt.  it is full of metal  . TENOTOMY ACHILLES TENDON Right    Percuntaneous. metal in foot  . TONSILLECTOMY    . WOUND DEBRIDEMENT Left 07/23/2018   Procedure: DEBRIDEMENT WOUND;  Surgeon: Algernon Huxley, MD;  Location: [facility] ORS;  Service: Vascular;  Laterality: Left;    There were no vitals filed for this visit.  Subjective Assessment - 06/02/19 1030    Subjective  Patinet state she is feeling okay today with 5/10 pain in the R knee. She states she had difficulty with walking to the mailbox becaue her R knee was bothering her yesterday and buckling. She states she felt fine following last treatment session. She does not feel up to practicing walking in the clinic but would like to continue working on mat exercises.    Pertinent History  Patient is a 73 y.o. female who presents to outpatient physical therapy with a referral for medical diagnosis of degeneration of lumbar intervertebral disc. This patient's chief complaints consist of chronic low back pain and foot pain, weakness, stiffness, leading to the following functional deficits: difficulty with with basic ADLs, IADLs, ambulation. She used to get injections to help control back pain but it has recently worsened due to being unable to get injections during [place] pandemic. The patient has been informed of current processes in place at [facility]patient [facility] to protect patients from Covid-19 exposure including social distancing, schedule  modifications, and new cleaning procedures. After discussing their particular risk with a therapist based on the patient's personal risk factors, the patient has decided to proceed with in-person therapy. Pt is scheduled for R LE angiography on 12/07/2018. She has recently started smoking again with the stress of COVID19 pandemic and feels strongly she needs to stop. Surgeon has stated in the past he will not complete surgery if she is smoking.  Relevant past medical history and comorbidities include underwent extensive vascular surgeries in Jan 2020 to improve blood flow, similar surgery for R leg scheduled 12/07/2018. Has a history of left carotid artery stenosis, but not enough to have surgery,  has a history of multiple arterial surgeries, history of R cataract surgery, colon surgery, cholecystectomy, R foot skin graft, ovary surgery, gastric bypass, R foot fusion, abdominal hysterectomy; current smoker, plantar fasciitis, peripheral arterial disease, LVH, diabetes mellitus with neuropathy, irregular heartbeat, hiatal hernia, GERD, stenosis of left heart ventricle, depression, lumbar degenerative disc disease, cardiac murmur, asthma, anxiety, obesity, HTN, and seasonal allergies She has been advised not to lift over 10 pounds by physician    Limitations  Sitting;Standing;Walking;House hold activities;Lifting    How long can you sit comfortably?  > 1 hour in almost any chair    How long can you stand comfortably?  10-15 min while cooking    How long can you walk comfortably?  2 times to the mailbox and back (120 feet to mailbox) with single point cane. Can ambulate 1 hour with rollator with seated resting breaks.    Diagnostic tests  MRI lumbar spine report 06/14/2017: "IMPRESSION: 1. Stable degenerative of lumbar spondylosis and scoliosis with multilevel disc disease and facet disease. 2. Stable right foraminal stenosis at L5-S1 due to right-sided disc    Patient Stated Goals  patient would like to get back to  PLOF, walking further, be better able to participate in dog showing and trips    Currently in Pain?  Yes    Pain Score  5     Pain Location  Knee    Pain Orientation  Right    Pain Onset  More than a month ago        TREATMENT  Therapeutic exercise:to centralize symptoms and improve ROM, strength, muscular endurance, and activity tolerance required for successful completion of functional activities.  NuStep38mnlevel 3 for 6 min and level 2 using  BLEsonly. Seatsetting 7 & 8. For improved extremity mobility, muscular endurance, and activity tolerance; and to induce the analgesic effect of aerobic exercise, stimulate improved joint nutrition, and prepare body structures and systems for following interventions.during subjective exam.Discomfort in R knee and unable to advance to level 3 today due to R knee pain. Seat setting 8 better today.  Average SPM =67. States back pain increased with nustep.   Mat Exercises:   sidelying hip abduction 4x 10 each side manual support to maintain proper form.   Supine bridges 4x10  Supine abdominal brace with active straght leg raise. 4x10 each side.  Cuing for form, assistance with bed mobility, and manual support as needed to achieve proper form. estim applied for pain control at patient's request.  Bed mobility: completed supine <> sit and rolling with min A due to difficulty moving around bed.   Modalities: - estim premeditated current 80/150 hz, continuous, intensity to patient preference (up to 18.5 mA). Two oval 4 inch electrodes placed near upper traps and each side of lumbar spine. During mat exercises. Pt response: reported pain reduction. Applied for 20 minutes during exercise plus time for education and  Setup (8 min).   Discussed  HEP and walking program. Educated on purpose and use of TENS/estim for pain control. Advised on how to get at TENS machine for home and gave handout of example of a machine that patients have found  useful in the past. Pt reports she cannot buy anything for herself until January and would like to have estim applied today. Agreed to use it during exercise.     HOME EXERCISE PROGRAM Access Code: [email or web address] URL: [email or web address]  Date: 05/18/2019  Prepared by: SRosita Kea  Exercises  Walking - 1.5 to the  mailbox and back - 1.5 2 minutes in the house - 1x daily - 7x weekly  Leg stretch - 10 reps - 3 seconds hold - 2x daily - 7x weekly  Seated Shoulder Flexion with Dumbbells - 2 sets - 5 reps - 1 second hold - 1x daily - 7x weekly  Seated Shoulder Abduction with Dumbbells - Thumbs Up - 2 sets - 5 reps - 1 second hold - 1x daily - 7x weekly  Seated Shoulder Horizontal Abduction with Resistance - 2 sets - 10 reps - 1 second hold - 1x daily - 7x weekly     PT Education - 06/02/19 1031    Education provided  Yes    Education Details  Exercise purpose/form. Self management techniques.    Person(s) Educated  Patient    Methods  Explanation;Demonstration;Tactile cues;Verbal cues    Comprehension  Verbalized understanding;Returned demonstration;Verbal cues required;Tactile cues required;Need further instruction       PT Short Term Goals - 05/31/19 2104      PT SHORT TERM GOAL #1   Title  Be independent with initial home exercise program for self-management of symptoms.    Baseline  Initial HEP provided at [facility] (11/17/2018); was completing until vascular procedure, re-instated 12/15/2018 (12/15/2018); currently participating (01/26/2019); Partially doing HEP due the patient's recent health condition/weakness(03/23/2019); Patient has been doing her HEP every other day. 04/20/2019; has returned to completing her walking HEP as prescribed (05/31/2019);    Time  2    Period  Weeks    Status  Achieved    Target Date  05/04/19        PT Long Term Goals - 05/31/19 2104      PT LONG TERM GOAL #1   Title  Be independent with a long-term home exercise program for  self-management of symptoms.     Baseline  Initial HEP provided at [facility] (11/17/2018); was doing better before vascular surgery, is working at returning to [facility] since procedure (12/15/2018); currently participating but not assigned final long term HEP yet (01/26/2019); currently participating but not assigned final long term HEP yet (03/23/2019); HEP progressing (04/20/2019; 05/31/2019);    Time  12    Period  Weeks    Status  Partially Met    Target Date  08/23/19      PT LONG TERM GOAL #2   Title  Patient will improve 6MWT to equal or greater than 1000 feet with LRAD to demonstrate improved activity tolerance for community ambulation.     Baseline  6 Minute Walk Test: 200 feet with SPC and supervision. Stopped at 1:43 due to fatigue and leg pain. (11/17/2018); 457 feet SPC, on seated and one standing break (12/15/2018); 547 with SPC, 2 seated and one standing rest break (01/26/2019); 468 feet with SPC, 2 seated breaks(03/23/2019); 638 feet with SPC, 2 seated breaks; 747 feet with SPC and 2 standing rest breaks (05/31/2019);    Time  12    Period  Weeks    Status  Partially Met    Target Date  08/23/19      PT LONG TERM GOAL #3   Title  Patient will demonstrate improved 5 Times Sit to Stand test to 11 seconds from chair height with no UE support to demonstrate improvement in LE power and functional strength for daily activities.    Baseline  5 Time Sit to Stand: 27 seconds from chair height no UE support (11/17/2018); 18 seconds, no UE support from chair-high plinth (12/15/2018); 16 seconds  with no UE support from chair (01/26/2019); 17.45s with lack of knee extension in standing(03/23/2019); 14.03s without BUEs help (04/20/2019); 16 seconds without BUE help from 18.5 inch seat (05/31/2019);    Time  12    Period  Weeks    Status  Partially Met    Target Date  08/23/19      PT LONG TERM GOAL #4   Title  Patient will ambulate faster than 1.26ms on the 10 Meter Walk Test to improve ability to cross street  safey for community participation(05/21/2019); 272m with SPC (05/31/2019);    Baseline  testing deferred to next session (11/17/2018); 1.37 m/sec with SPC (11/24/2018); 1.3 m/sec with SPC (12/15/2018; 01/26/2019); 0.4745m(03/23/2019); 0.98 m/sec with SPC (04/20/2019); 1 m/sec (05/31/2019);    Time  12    Period  Weeks    Status  Partially Met    Target Date  08/23/19      PT LONG TERM GOAL #5   Title  Patient will score equal or greater than  25/30 on the Functional Gait Assessment to demonstrate low fall risk to improve her ability to participate safely in community activities.     Baseline  testing deferred to next session (11/17/2018); 11/30 high fall risk (11/24/2018); unable to measure due to time limitation (12/15/2018); 13/30 high fall risk (12/17/2018); 14/30 high fall risk (01/26/2019); 14/30 high fall risk(03/23/2019); 17/30 high fall risk (04/20/2019); 21/30 moderate fall risk (05/31/2019);    Time  12    Period  Weeks    Status  Partially Met    Target Date  08/23/19      PT LONG TERM GOAL #6   Title  Reduce pain to equal or less than 4/10 with functional activities to allow patient to complete valued functional tasks such as caring for dogs, grocery shopping, walking with less difficulty.(03/23/2019);    Baseline  8/10 (01/26/2019); 6/10 (03/23/2019); 4/10 (04/20/2019); 4/10 (05/31/2019);    Time  12    Period  Weeks    Status  Partially Met    Target Date  08/23/19            Plan - 06/02/19 1140    Clinical Impression Statement  Pt tolerated treatment well and reported reduced pain with estim. Pt was able to complete all exercises with minimal to no lasting increase in pain or discomfort. She declined to practice walking today and wanted more passive treatments despite no increase in her usual pain levels, but agreed to participate in glue, hip, core and LE strengthening and motor control exercises while receiving estim. Educated patient that estim is for pain control and does  not have known long term benefit and clinic time is best used working on exercise that has been shown to have more long term and meaningful effect over time. Advised patient that estim is a useful home modality and provided information about how to get a home unit. Pt required multimodal cuing for proper technique and to facilitate improved neuromuscular control, strength, range of motion, and functional ability resulting in improved performance and form. Patient would benefit from continued physical therapy to address remaining impairments and functional limitations to work towards stated goals and return to PLOF or maximal functional independence.    Personal Factors and Comorbidities  Age;Comorbidity 3+;Fitness;Time since onset of injury/illness/exacerbation;Past/Current Experience;Other    Comorbidities  underwent extensive vascular surgeries in Jan 2020 to improve blood flow, similar surgery for R leg scheduled 12/07/2018. Has a history of left carotid artery stenosis,  but not enough to have surgery, has a history of multiple arterial surgeries, history of R cataract surgery, colon surgery, cholecystectomy, R foot skin graft, ovary surgery, gastric bypass, R foot fusion, abdominal hysterectomy; current smoker, plantar fasciitis, peripheral arterial disease, LVH, diabetes mellitus with neuropathy, irregular heartbeat, hiatal hernia, GERD, stenosis of left heart ventricle, depression, lumbar degenerative disc disease, cardiac murmur, asthma, anxiety, obesity, HTN, and seasonal allergies She has been advised not to lift over 10 pounds by physician.      Examination-Activity Limitations  Bathing;Carry;Lift;Sit;Stand;Locomotion Level;Toileting;Dressing;Squat;Transfers;Stairs;Hygiene/Grooming;Other    Examination-Participation Restrictions  Community Activity;Volunteer;Cleaning;Interpersonal Relationship;Yard Work;Other    Rehab Potential  Fair    Clinical Impairments Affecting Rehab Potential  (+) motivated (-)  muliple co morbidities, chronic condition    PT Frequency  2x / week    PT Duration  12 weeks    PT Treatment/Interventions  Moist Heat;Patient/family education;Neuromuscular re-education;Therapeutic exercise;Manual techniques;ADLs/Self Care Home Management;Electrical Stimulation;Gait training;Stair training;Functional mobility training;Therapeutic activities;Balance training;Passive range of motion;Dry needling;Energy conservation;Spinal Manipulations;Joint Manipulations;Cryotherapy    PT Next Visit Plan  strengthening, pain control, and promotion of self-management techniques    PT Home Exercise Plan  Medbridge Access Code: [email or web address]    Consulted and Agree with Plan of Care  Patient       Patient will benefit from skilled therapeutic intervention in order to improve the following deficits and impairments:  Decreased strength, Impaired flexibility, Decreased activity tolerance, Impaired perceived functional ability, Pain, Decreased endurance, Difficulty walking, Abnormal gait, Decreased knowledge of use of DME, Decreased skin integrity, Decreased range of motion, Impaired sensation, Improper body mechanics, Obesity, Postural dysfunction, Increased edema, Decreased mobility, Decreased balance, Cardiopulmonary status limiting activity, Decreased coordination  Visit Diagnosis: Muscle weakness (generalized)  Chronic bilateral low back pain, unspecified whether sciatica present  Difficulty in walking, not elsewhere classified  History of falling     Problem List Patient Active Problem List   Diagnosis Date Noted  . Osteopenia 03/03/2019  . Lower extremity edema 07/31/2018  . Pressure injury of skin 07/24/2018  . Surgical site infection 07/23/2018  . Wound of left leg 07/23/2018  . Atherosclerosis of artery of extremity with rest pain ([place]) 07/08/2018  . Shortness of breath 06/02/2018  . Bruit 06/02/2018  . Coronary artery disease of native artery of native heart with stable angina  pectoris ([place]) 06/02/2018  . Atherosclerosis of native arteries of extremity with intermittent claudication ([place]) 05/19/2018  . Peripheral arterial disease ([place]) 05/06/2018  . Personal history of colonic polyps   . Aortic stenosis 12/24/2017  . LVH (left ventricular hypertrophy) 12/24/2017  . Hypertension with heart disease 12/24/2017  . Left atrial dilatation 12/12/2017  . Cardiac murmur 12/12/2017  . Gastrojejunal ulcer 12/04/2017  . Hyperphosphatemia 12/04/2017  . Spinal stenosis of lumbar region 06/18/2017  . Degeneration of lumbar intervertebral disc 06/02/2017  . Assistance needed with transportation 05/26/2017  . Financial difficulties 05/26/2017  . Needs assistance with community resources 05/26/2017  . Moderate recurrent major depression ([place]) 05/26/2017  . Non-healing wound of lower extremity 02/06/2017  . Status post ankle arthrodesis 12/11/2016  . Controlled substance agreement broken 10/11/2016  . Low back pain 09/25/2016  . Osteoarthritis of right subtalar joint 07/11/2016  . Posterior tibial tendinitis of right lower extremity 07/11/2016  . Breast cancer screening 06/18/2016  . Knee pain 04/03/2016  . Hyponatremia 03/01/2016  . High triglycerides 12/24/2015  . Pes planus of both feet 11/16/2015  . Plantar fasciitis of right foot 11/16/2015  . Heel spur 11/16/2015  . Diabetic  neuropathy ([place]) 11/16/2015  . Right ankle pain 11/16/2015  . Medication monitoring encounter 11/16/2015  . Chronic pain of multiple joints 08/15/2015  . Abnormal mammogram of right breast 06/19/2015  . Cerumen impaction 03/16/2015  . Hyperlipidemia 12/14/2014  . History of transfusion of packed RBC 12/14/2014  . Hx of smoking 12/14/2014  . Status post bariatric surgery 12/14/2014  . Morbid obesity ([place]) 12/14/2014  . Chronic radicular lumbar pain 12/14/2014  . History of GI bleed 11/12/2014  . History of small bowel obstruction 09/07/2014    Everlean Alstrom. [name], PT, DPT 06/02/19, 11:41  AM  [facility] [address] [place], [address] Phone: [phone]   Fax:  [phone]  Name: Andrea Holden MRN: [id number] Date of Birth: 1946-05-21

## 2019-06-04 LAB — CBC WITH DIFFERENTIAL/PLATELET
Absolute Monocytes: 456 cells/uL (ref 200–950)
Basophils Absolute: 61 cells/uL (ref 0–200)
Basophils Relative: 0.8 %
Eosinophils Absolute: 137 cells/uL (ref 15–500)
Eosinophils Relative: 1.8 %
HCT: 37.3 % (ref 35.0–45.0)
Hemoglobin: 12.3 g/dL (ref 11.7–15.5)
Lymphs Abs: 1186 cells/uL (ref 850–3900)
MCH: 27.8 pg (ref 27.0–33.0)
MCHC: 33 g/dL (ref 32.0–36.0)
MCV: 84.4 fL (ref 80.0–100.0)
MPV: 9.6 fL (ref 7.5–12.5)
Monocytes Relative: 6 %
Neutro Abs: 5761 cells/uL (ref 1500–7800)
Neutrophils Relative %: 75.8 %
Platelets: 169 10*3/uL (ref 140–400)
RBC: 4.42 10*6/uL (ref 3.80–5.10)
RDW: 13.8 % (ref 11.0–15.0)
Total Lymphocyte: 15.6 %
WBC: 7.6 10*3/uL (ref 3.8–10.8)

## 2019-06-04 LAB — COMPLETE METABOLIC PANEL WITH GFR
AG Ratio: 1.9 (calc) (ref 1.0–2.5)
ALT: 11 U/L (ref 6–29)
AST: 15 U/L (ref 10–35)
Albumin: 4.3 g/dL (ref 3.6–5.1)
Alkaline phosphatase (APISO): 66 U/L (ref 37–153)
BUN: 16 mg/dL (ref 7–25)
CO2: 27 mmol/L (ref 20–32)
Calcium: 9.8 mg/dL (ref 8.6–10.4)
Chloride: 101 mmol/L (ref 98–110)
Creat: 0.78 mg/dL (ref 0.60–0.93)
GFR, Est African American: 88 mL/min/{1.73_m2} (ref >=60)
GFR, Est Non African American: 76 mL/min/{1.73_m2} (ref >=60)
Globulin: 2.3 g/dL (calc) (ref 1.9–3.7)
Glucose, Bld: 116 mg/dL — ABNORMAL HIGH (ref 65–99)
Potassium: 4.9 mmol/L (ref 3.5–5.3)
Sodium: 135 mmol/L (ref 135–146)
Total Bilirubin: 0.5 mg/dL (ref 0.2–1.2)
Total Protein: 6.6 g/dL (ref 6.1–8.1)

## 2019-06-04 LAB — HEMOGLOBIN A1C
Hgb A1c MFr Bld: 6.6 % of total Hgb — ABNORMAL HIGH (ref <5.7)
Mean Plasma Glucose: 143 (calc)
eAG (mmol/L): 7.9 (calc)

## 2019-06-04 LAB — LIPID PANEL
Cholesterol: 129 mg/dL (ref <200)
HDL: 43 mg/dL — ABNORMAL LOW (ref >=50)
LDL Cholesterol (Calc): 67 mg/dL (calc)
Non-HDL Cholesterol (Calc): 86 mg/dL (calc) (ref <130)
Total CHOL/HDL Ratio: 3 (calc) (ref <5.0)
Triglycerides: 109 mg/dL (ref <150)

## 2019-06-07 ENCOUNTER — Other Ambulatory Visit: Payer: Self-pay

## 2019-06-07 ENCOUNTER — Encounter: Payer: Self-pay | Admitting: Physical Therapy

## 2019-06-07 ENCOUNTER — Ambulatory Visit: Payer: Medicare Other | Admitting: Physical Therapy

## 2019-06-07 DIAGNOSIS — M545 Low back pain, unspecified: Secondary | ICD-10-CM

## 2019-06-07 DIAGNOSIS — Z9181 History of falling: Secondary | ICD-10-CM

## 2019-06-07 DIAGNOSIS — G8929 Other chronic pain: Secondary | ICD-10-CM

## 2019-06-07 DIAGNOSIS — M6281 Muscle weakness (generalized): Secondary | ICD-10-CM | POA: Diagnosis not present

## 2019-06-07 DIAGNOSIS — R262 Difficulty in walking, not elsewhere classified: Secondary | ICD-10-CM

## 2019-06-07 NOTE — Therapy (Signed)
[facility] [address], [place], [address] Phone: [phone]   Fax:  [phone]  Physical Therapy Treatment  Patient Details  Name: Andrea Holden MRN: [id number] Date of Birth: 03-15-1946 Referring Provider (PT): Fredderick Severance, NP    Encounter Date: 06/07/2019  PT End of Session - 06/07/19 1059    Visit Number  43    Number of Visits  65    Date for PT Re-Evaluation  08/23/19    Authorization Type  [facility] Medicare reporting period from  04/20/2019 (new reporting period from 05/31/2019)    Authorization - Visit Number  3    Authorization - Number of Visits  10    PT Start Time  1031    PT Stop Time  1112    PT Time Calculation (min)  41 min    Equipment Utilized During Treatment  Gait belt    Activity Tolerance  Patient limited by pain;Patient tolerated treatment well;Patient limited by fatigue    Behavior During Therapy  [facility] for tasks assessed/performed       Past Medical History:  Diagnosis Date  . Allergy    seasonal allergies  . Anemia 2014   needed 5 units of blood d/t passing out, weak  . Anxiety   . Aortic stenosis 12/24/2017   Echo Aug 2018  . Arthritis   . Asthma    allergy induced asthma  . Cardiac murmur 12/12/2017  . Cataract   . Cataract    left  . Complication of anesthesia    arrhythmia following colonoscopy  . Degenerative disc disease, lumbar   . Degenerative disc disease, lumbar   . Depression   . Diabetes mellitus   . Diabetic neuropathy ([place]) 11/16/2015  . Dysrhythmia    stenosis of left ventricle  . GERD (gastroesophageal reflux disease)   . H/O transfusion    patient was given 5 units of blood while at [place], blood type O+  . History of chicken pox   . History of hiatal hernia   . History of measles, mumps, or rubella   . HOH (hard of hearing)    does not use hearing aides yet  . Hyperlipidemia   . Hypertension   . Irregular heartbeat   . LVH  (left ventricular hypertrophy) 12/24/2017   Echo Aug 2018  . Neuropathy   . Opiate use 11/16/2015  . Peripheral arterial disease ([place]) 05/06/2018   At rest, left > right; refer to vasc  . Pes planus of both feet 11/16/2015  . Plantar fasciitis of right foot 11/16/2015  . Wheezing     Past Surgical History:  Procedure Laterality Date  . ABDOMINAL HYSTERECTOMY    . APPLICATION OF WOUND VAC Left 07/23/2018   Procedure: APPLICATION OF WOUND VAC;  Surgeon: Algernon Huxley, MD;  Location: [facility] ORS;  Service: Vascular;  Laterality: Left;  . banck injections    . CATARACT EXTRACTION W/PHACO Right 05/30/2015   Procedure: CATARACT EXTRACTION PHACO AND INTRAOCULAR LENS PLACEMENT (IOC);  Surgeon: Birder Robson, MD;  Location: [facility] ORS;  Service: Ophthalmology;  Laterality: Right;  [place] 00:57 AP% 20.9 CDE 11.99 fluid pack lot #[id number]  . CHOLECYSTECTOMY  1970  . COLON SURGERY  2013   blocked colon  . COLONOSCOPY WITH PROPOFOL N/A 01/20/2018   Procedure: COLONOSCOPY WITH PROPOFOL;  Surgeon: Lucilla Lame, MD;  Location: [facility] ENDOSCOPY;  Service: Endoscopy;  Laterality: N/A;  . ENDARTERECTOMY FEMORAL Left 07/08/2018  Procedure: ENDARTERECTOMY FEMORAL;  Surgeon: Algernon Huxley, MD;  Location: [facility] ORS;  Service: Vascular;  Laterality: Left;  . EYE SURGERY Right    cataract surgery  . FOOT FUSION Right 2018   metal in foot  . GALLBLADDER SURGERY  1970  . GASTRIC BYPASS  2010   lost 178 lbs and regained 40 lbs last few years  . HUMERUS FRACTURE SURGERY Right    metal plate with screws  . internal bleeding  2016   ulcer in past  . LOWER EXTREMITY ANGIOGRAM Left 07/08/2018   Procedure: LOWER EXTREMITY ANGIOGRAM;  Surgeon: Algernon Huxley, MD;  Location: [facility] ORS;  Service: Vascular;  Laterality: Left;  . LOWER EXTREMITY ANGIOGRAPHY Left 05/27/2018   Procedure: LOWER EXTREMITY ANGIOGRAPHY;  Surgeon: Algernon Huxley, MD;  Location: [place] [facility] LAB;  Service: Cardiovascular;  Laterality: Left;  . LOWER  EXTREMITY ANGIOGRAPHY Right 12/07/2018   Procedure: LOWER EXTREMITY ANGIOGRAPHY;  Surgeon: Algernon Huxley, MD;  Location: [place] CV LAB;  Service: Cardiovascular;  Laterality: Right;  . LOWER EXTREMITY ANGIOGRAPHY Left 05/10/2019   Procedure: LOWER EXTREMITY ANGIOGRAPHY;  Surgeon: Algernon Huxley, MD;  Location: [place] [facility];  Service: Cardiovascular;  Laterality: Left;  . MOUTH SURGERY     root canals and crowns and extractions  . OVARY SURGERY    . SKIN GRAFT Right 2018   RT foot. foot has been rebuilt.  it is full of metal  . TENOTOMY ACHILLES TENDON Right    Percuntaneous. metal in foot  . TONSILLECTOMY    . WOUND DEBRIDEMENT Left 07/23/2018   Procedure: DEBRIDEMENT WOUND;  Surgeon: Algernon Huxley, MD;  Location: [facility] ORS;  Service: Vascular;  Laterality: Left;    There were no vitals filed for this visit.  Subjective Assessment - 06/07/19 1037    Subjective  Patient reports her R knee is hurting 4/10 pain upon arrival. No falls since last session. Has been walking around the outside of the house a lot with the dog traininer. She reports her back is feeling pretty good today and she felt good following last treatment session. She would like to work on some weights for the UEs today because she will be standing a lot later today baking.    Pertinent History  Patient is a 73 y.o. female who presents to outpatient physical therapy with a referral for medical diagnosis of degeneration of lumbar intervertebral disc. This patient's chief complaints consist of chronic low back pain and foot pain, weakness, stiffness, leading to the following functional deficits: difficulty with with basic ADLs, IADLs, ambulation. She used to get injections to help control back pain but it has recently worsened due to being unable to get injections during [place] pandemic. The patient has been informed of current processes in place at [facility]patient [facility] to protect patients from Covid-19 exposure including social  distancing, schedule modifications, and new cleaning procedures. After discussing their particular risk with a therapist based on the patient's personal risk factors, the patient has decided to proceed with in-person therapy. Pt is scheduled for R LE angiography on 12/07/2018. She has recently started smoking again with the stress of COVID19 pandemic and feels strongly she needs to stop. Surgeon has stated in the past he will not complete surgery if she is smoking.  Relevant past medical history and comorbidities include underwent extensive vascular surgeries in Jan 2020 to improve blood flow, similar surgery for R leg scheduled 12/07/2018. Has a history of left carotid artery  stenosis, but not enough to have surgery, has a history of multiple arterial surgeries, history of R cataract surgery, colon surgery, cholecystectomy, R foot skin graft, ovary surgery, gastric bypass, R foot fusion, abdominal hysterectomy; current smoker, plantar fasciitis, peripheral arterial disease, LVH, diabetes mellitus with neuropathy, irregular heartbeat, hiatal hernia, GERD, stenosis of left heart ventricle, depression, lumbar degenerative disc disease, cardiac murmur, asthma, anxiety, obesity, HTN, and seasonal allergies She has been advised not to lift over 10 pounds by physician    Limitations  Sitting;Standing;Walking;House hold activities;Lifting    How long can you sit comfortably?  > 1 hour in almost any chair    How long can you stand comfortably?  10-15 min while cooking    How long can you walk comfortably?  2 times to the mailbox and back (120 feet to mailbox) with single point cane. Can ambulate 1 hour with rollator with seated resting breaks.    Diagnostic tests  MRI lumbar spine report 06/14/2017: "IMPRESSION: 1. Stable degenerative of lumbar spondylosis and scoliosis with multilevel disc disease and facet disease. 2. Stable right foraminal stenosis at L5-S1 due to right-sided disc    Patient Stated Goals  patient would  like to get back to PLOF, walking further, be better able to participate in dog showing and trips    Currently in Pain?  Yes    Pain Score  4     Pain Location  Knee    Pain Orientation  Right    Pain Descriptors / Indicators  Aching    Pain Onset  More than a month ago       TREATMENT  Patient goal until next session: 2 laps walking around outside of home each day.   Therapeutic exercise:to centralize symptoms and improve ROM, strength, muscular endurance, and activity tolerance required for successful completion of functional activities.  [id number] for 6 min and level 2 using BLEsonly. Seatsetting 8. For improved extremity mobility, muscular endurance, and activity tolerance; and to induce the analgesic effect of aerobic exercise, stimulate improved joint nutrition, and prepare body structures and systems for following interventions.during subjective exam.Discomfort in R knee and unable to advance to level 3 today due to R knee pain.  Average SPM =71.x6 min during subjective exam.   Seated exercises with trunk unsupported sitting on dynadisc to improve core strength:  - B shoulder flexion, 3x10 - B shoulder abduction, 3x10 - B horizontal shoulder abduction, yellow theraband loop, 3x10  - pallof press, yellow theraband loop, 3x10 each side - seated marching without UE support, 3x1 min - Rows against red theraband, x30  Sit <> stand from 22 inch plinth with no UE support, cuing for glute contraction. X15, x6, x9.  Multimodal cuing for form, purpose of exercise. Education about HEP with education and discussion about goals for HEP until next visit. Assistance with holding band.    HOME EXERCISE PROGRAM Access Code: [email or web address] URL: [email or web address]  Date: 05/18/2019  Prepared by: SRosita Kea  Exercises  Walking - 1.5 to the mailbox and back - 1.5 2 minutes in the house - 1x daily - 7x weekly  Leg stretch - 10 reps - 3 seconds hold - 2x  daily - 7x weekly  Seated Shoulder Flexion with Dumbbells - 2 sets - 5 reps - 1 second hold - 1x daily - 7x weekly  Seated Shoulder Abduction with Dumbbells - Thumbs Up - 2 sets - 5 reps - 1 second hold - 1x daily - 7x weekly  Seated  Shoulder Horizontal Abduction with Resistance - 2 sets - 10 reps - 1 second hold - 1x daily - 7x weekly    PT Education - 06/07/19 1059    Education provided  Yes    Education Details  Exercise purpose/form. Self management techniques.    Person(s) Educated  Patient    Methods  Explanation;Tactile cues;Demonstration;Verbal cues    Comprehension  Verbalized understanding;Returned demonstration;Verbal cues required;Tactile cues required;Need further instruction       PT Short Term Goals - 05/31/19 2104      PT SHORT TERM GOAL #1   Title  Be independent with initial home exercise program for self-management of symptoms.    Baseline  Initial HEP provided at IE (11/17/2018); was completing until vascular procedure, re-instated 12/15/2018 (12/15/2018); currently participating (01/26/2019); Partially doing HEP due the patient's recent health condition/weakness(03/23/2019); Patient has been doing her HEP every other day. 04/20/2019; has returned to completing her walking HEP as prescribed (05/31/2019);    Time  2    Period  Weeks    Status  Achieved    Target Date  05/04/19        PT Long Term Goals - 05/31/19 2104      PT LONG TERM GOAL #1   Title  Be independent with a long-term home exercise program for self-management of symptoms.     Baseline  Initial HEP provided at [facility] (11/17/2018); was doing better before vascular surgery, is working at returning to [facility] since procedure (12/15/2018); currently participating but not assigned final long term HEP yet (01/26/2019); currently participating but not assigned final long term HEP yet (03/23/2019); HEP progressing (04/20/2019; 05/31/2019);    Time  12    Period  Weeks    Status  Partially Met    Target Date  08/23/19       PT LONG TERM GOAL #2   Title  Patient will improve 6MWT to equal or greater than 1000 feet with LRAD to demonstrate improved activity tolerance for community ambulation.     Baseline  6 Minute Walk Test: 200 feet with SPC and supervision. Stopped at 1:43 due to fatigue and leg pain. (11/17/2018); 457 feet SPC, on seated and one standing break (12/15/2018); 547 with SPC, 2 seated and one standing rest break (01/26/2019); 468 feet with SPC, 2 seated breaks(03/23/2019); 638 feet with SPC, 2 seated breaks; 747 feet with SPC and 2 standing rest breaks (05/31/2019);    Time  12    Period  Weeks    Status  Partially Met    Target Date  08/23/19      PT LONG TERM GOAL #3   Title  Patient will demonstrate improved 5 Times Sit to Stand test to 11 seconds from chair height with no UE support to demonstrate improvement in LE power and functional strength for daily activities.    Baseline  5 Time Sit to Stand: 27 seconds from chair height no UE support (11/17/2018); 18 seconds, no UE support from chair-high plinth (12/15/2018); 16 seconds with no UE support from chair (01/26/2019); 17.45s with lack of knee extension in standing(03/23/2019); 14.03s without BUEs help (04/20/2019); 16 seconds without BUE help from 18.5 inch seat (05/31/2019);    Time  12    Period  Weeks    Status  Partially Met    Target Date  08/23/19      PT LONG TERM GOAL #4   Title  Patient will ambulate faster than 1.65ms on the 10 Meter Walk Test  to improve ability to cross street safey for community participation(05/21/2019); 42ms with SPC (05/31/2019);    Baseline  testing deferred to next session (11/17/2018); 1.37 m/sec with SPC (11/24/2018); 1.3 m/sec with SPC (12/15/2018; 01/26/2019); 0.445m (03/23/2019); 0.98 m/sec with SPC (04/20/2019); 1 m/sec (05/31/2019);    Time  12    Period  Weeks    Status  Partially Met    Target Date  08/23/19      PT LONG TERM GOAL #5   Title  Patient will score equal or greater than  25/30 on the  Functional Gait Assessment to demonstrate low fall risk to improve her ability to participate safely in community activities.     Baseline  testing deferred to next session (11/17/2018); 11/30 high fall risk (11/24/2018); unable to measure due to time limitation (12/15/2018); 13/30 high fall risk (12/17/2018); 14/30 high fall risk (01/26/2019); 14/30 high fall risk(03/23/2019); 17/30 high fall risk (04/20/2019); 21/30 moderate fall risk (05/31/2019);    Time  12    Period  Weeks    Status  Partially Met    Target Date  08/23/19      PT LONG TERM GOAL #6   Title  Reduce pain to equal or less than 4/10 with functional activities to allow patient to complete valued functional tasks such as caring for dogs, grocery shopping, walking with less difficulty.(03/23/2019);    Baseline  8/10 (01/26/2019); 6/10 (03/23/2019); 4/10 (04/20/2019); 4/10 (05/31/2019);    Time  12    Period  Weeks    Status  Partially Met    Target Date  08/23/19            Plan - 06/07/19 1111    Clinical Impression Statement  Patient tolerated treatment well with no reported increase in pain. She was appropriately fatigued and today's session focused on improved core strength with BUE and BLE motion as requested by patient to work towards her goals. Continued to discuss walking program and ways to implement self-management to continue working toward patient's goals.  Pt required multimodal cuing for proper technique and to facilitate improved neuromuscular control, strength, range of motion, and functional ability resulting in improved performance and form. Patient would benefit from continued physical therapy to address remaining impairments and functional limitations to work towards stated goals and return to PLOF or maximal functional independence.    Personal Factors and Comorbidities  Age;Comorbidity 3+;Fitness;Time since onset of injury/illness/exacerbation;Past/Current Experience;Other    Comorbidities  underwent extensive  vascular surgeries in Jan 2020 to improve blood flow, similar surgery for R leg scheduled 12/07/2018. Has a history of left carotid artery stenosis, but not enough to have surgery, has a history of multiple arterial surgeries, history of R cataract surgery, colon surgery, cholecystectomy, R foot skin graft, ovary surgery, gastric bypass, R foot fusion, abdominal hysterectomy; current smoker, plantar fasciitis, peripheral arterial disease, LVH, diabetes mellitus with neuropathy, irregular heartbeat, hiatal hernia, GERD, stenosis of left heart ventricle, depression, lumbar degenerative disc disease, cardiac murmur, asthma, anxiety, obesity, HTN, and seasonal allergies She has been advised not to lift over 10 pounds by physician.      Examination-Activity Limitations  Bathing;Carry;Lift;Sit;Stand;Locomotion Level;Toileting;Dressing;Squat;Transfers;Stairs;Hygiene/Grooming;Other    Examination-Participation Restrictions  Community Activity;Volunteer;Cleaning;Interpersonal Relationship;Yard Work;Other    Rehab Potential  Fair    Clinical Impairments Affecting Rehab Potential  (+) motivated (-) muliple co morbidities, chronic condition    PT Frequency  2x / week    PT Duration  12 weeks    PT Treatment/Interventions  Moist Heat;Patient/family  education;Neuromuscular re-education;Therapeutic exercise;Manual techniques;ADLs/Self Care Home Management;Electrical Stimulation;Gait training;Stair training;Functional mobility training;Therapeutic activities;Balance training;Passive range of motion;Dry needling;Energy conservation;Spinal Manipulations;Joint Manipulations;Cryotherapy    PT Next Visit Plan  strengthening, pain control, and promotion of self-management techniques    PT Home Exercise Plan  Medbridge Access Code: [email or web address]    Consulted and Agree with Plan of Care  Patient       Patient will benefit from skilled therapeutic intervention in order to improve the following deficits and impairments:  Decreased  strength, Impaired flexibility, Decreased activity tolerance, Impaired perceived functional ability, Pain, Decreased endurance, Difficulty walking, Abnormal gait, Decreased knowledge of use of DME, Decreased skin integrity, Decreased range of motion, Impaired sensation, Improper body mechanics, Obesity, Postural dysfunction, Increased edema, Decreased mobility, Decreased balance, Cardiopulmonary status limiting activity, Decreased coordination  Visit Diagnosis: Muscle weakness (generalized)  Chronic bilateral low back pain, unspecified whether sciatica present  Difficulty in walking, not elsewhere classified  History of falling     Problem List Patient Active Problem List   Diagnosis Date Noted  . Osteopenia 03/03/2019  . Lower extremity edema 07/31/2018  . Pressure injury of skin 07/24/2018  . Surgical site infection 07/23/2018  . Wound of left leg 07/23/2018  . Atherosclerosis of artery of extremity with rest pain ([place]) 07/08/2018  . Shortness of breath 06/02/2018  . Bruit 06/02/2018  . Coronary artery disease of native artery of native heart with stable angina pectoris ([place]) 06/02/2018  . Atherosclerosis of native arteries of extremity with intermittent claudication ([place]) 05/19/2018  . Peripheral arterial disease ([place]) 05/06/2018  . Personal history of colonic polyps   . Aortic stenosis 12/24/2017  . LVH (left ventricular hypertrophy) 12/24/2017  . Hypertension with heart disease 12/24/2017  . Left atrial dilatation 12/12/2017  . Cardiac murmur 12/12/2017  . Gastrojejunal ulcer 12/04/2017  . Hyperphosphatemia 12/04/2017  . Spinal stenosis of lumbar region 06/18/2017  . Degeneration of lumbar intervertebral disc 06/02/2017  . Assistance needed with transportation 05/26/2017  . Financial difficulties 05/26/2017  . Needs assistance with community resources 05/26/2017  . Moderate recurrent major depression ([place]) 05/26/2017  . Non-healing wound of lower extremity 02/06/2017   . Status post ankle arthrodesis 12/11/2016  . Controlled substance agreement broken 10/11/2016  . Low back pain 09/25/2016  . Osteoarthritis of right subtalar joint 07/11/2016  . Posterior tibial tendinitis of right lower extremity 07/11/2016  . Breast cancer screening 06/18/2016  . Knee pain 04/03/2016  . Hyponatremia 03/01/2016  . High triglycerides 12/24/2015  . Pes planus of both feet 11/16/2015  . Plantar fasciitis of right foot 11/16/2015  . Heel spur 11/16/2015  . Diabetic neuropathy ([place]) 11/16/2015  . Right ankle pain 11/16/2015  . Medication monitoring encounter 11/16/2015  . Chronic pain of multiple joints 08/15/2015  . Abnormal mammogram of right breast 06/19/2015  . Cerumen impaction 03/16/2015  . Hyperlipidemia 12/14/2014  . History of transfusion of packed RBC 12/14/2014  . Hx of smoking 12/14/2014  . Status post bariatric surgery 12/14/2014  . Morbid obesity ([place]) 12/14/2014  . Chronic radicular lumbar pain 12/14/2014  . History of GI bleed 11/12/2014  . History of small bowel obstruction 09/07/2014   Everlean Alstrom. [name], PT, DPT 06/07/19, 11:12 AM  [facility] [address], [place], [address] Phone: [phone]   Fax:  [phone]  Name: COLLIN HENDLEY MRN: [id number] Date of Birth: 09-Aug-1945

## 2019-06-09 ENCOUNTER — Encounter: Payer: Self-pay | Admitting: Physical Therapy

## 2019-06-09 ENCOUNTER — Ambulatory Visit: Payer: Medicare Other | Admitting: Physical Therapy

## 2019-06-09 ENCOUNTER — Other Ambulatory Visit: Payer: Self-pay

## 2019-06-09 DIAGNOSIS — R262 Difficulty in walking, not elsewhere classified: Secondary | ICD-10-CM

## 2019-06-09 DIAGNOSIS — Z9181 History of falling: Secondary | ICD-10-CM

## 2019-06-09 DIAGNOSIS — M6281 Muscle weakness (generalized): Secondary | ICD-10-CM

## 2019-06-09 DIAGNOSIS — M545 Low back pain: Secondary | ICD-10-CM

## 2019-06-09 DIAGNOSIS — G8929 Other chronic pain: Secondary | ICD-10-CM

## 2019-06-09 NOTE — Therapy (Signed)
[facility] [address] [place], [address] Phone: [phone]   Fax:  [phone]  Physical Therapy Treatment  Patient Details  Name: Andrea Holden MRN: [id number] Date of Birth: 10/15/1945 Referring Provider (PT): Fredderick Severance, NP    Encounter Date: 06/09/2019  PT End of Session - 06/09/19 1402    Visit Number  44    Number of Visits  65    Date for PT Re-Evaluation  08/23/19    Authorization Type  [facility] Medicare reporting period from  04/20/2019 (new reporting period from 05/31/2019)    Authorization - Visit Number  4    Authorization - Number of Visits  10    PT Start Time  1400   patient late   PT Stop Time  1430    PT Time Calculation (min)  30 min    Equipment Utilized During Treatment  Gait belt    Activity Tolerance  Patient limited by pain;Patient tolerated treatment well;Patient limited by fatigue    Behavior During Therapy  [facility] for tasks assessed/performed       Past Medical History:  Diagnosis Date  . Allergy    seasonal allergies  . Anemia 2014   needed 5 units of blood d/t passing out, weak  . Anxiety   . Aortic stenosis 12/24/2017   Echo Aug 2018  . Arthritis   . Asthma    allergy induced asthma  . Cardiac murmur 12/12/2017  . Cataract   . Cataract    left  . Complication of anesthesia    arrhythmia following colonoscopy  . Degenerative disc disease, lumbar   . Degenerative disc disease, lumbar   . Depression   . Diabetes mellitus   . Diabetic neuropathy ([place]) 11/16/2015  . Dysrhythmia    stenosis of left ventricle  . GERD (gastroesophageal reflux disease)   . H/O transfusion    patient was given 5 units of blood while at [facility], blood type O+  . History of chicken pox   . History of hiatal hernia   . History of measles, mumps, or rubella   . HOH (hard of hearing)    does not use hearing aides yet  . Hyperlipidemia   . Hypertension   . Irregular  heartbeat   . LVH (left ventricular hypertrophy) 12/24/2017   Echo Aug 2018  . Neuropathy   . Opiate use 11/16/2015  . Peripheral arterial disease ([place]) 05/06/2018   At rest, left > right; refer to vasc  . Pes planus of both feet 11/16/2015  . Plantar fasciitis of right foot 11/16/2015  . Wheezing     Past Surgical History:  Procedure Laterality Date  . ABDOMINAL HYSTERECTOMY    . APPLICATION OF WOUND VAC Left 07/23/2018   Procedure: APPLICATION OF WOUND VAC;  Surgeon: Algernon Huxley, MD;  Location: [facility] ORS;  Service: Vascular;  Laterality: Left;  . banck injections    . CATARACT EXTRACTION W/PHACO Right 05/30/2015   Procedure: CATARACT EXTRACTION PHACO AND INTRAOCULAR LENS PLACEMENT (IOC);  Surgeon: Birder Robson, MD;  Location: [facility] ORS;  Service: Ophthalmology;  Laterality: Right;  [place] 00:57 AP% 20.9 CDE 11.99 fluid pack lot #[id number]  . CHOLECYSTECTOMY  1970  . COLON SURGERY  2013   blocked colon  . COLONOSCOPY WITH PROPOFOL N/A 01/20/2018   Procedure: COLONOSCOPY WITH PROPOFOL;  Surgeon: Lucilla Lame, MD;  Location: [facility] END[facility]COPY;  Service: Endoscopy;  Laterality: N/A;  . ENDARTERECTOMY FEMORAL  Left 07/08/2018   Procedure: ENDARTERECTOMY FEMORAL;  Surgeon: Algernon Huxley, MD;  Location: [facility] ORS;  Service: Vascular;  Laterality: Left;  . EYE SURGERY Right    cataract surgery  . FOOT FUSION Right 2018   metal in foot  . GALLBLADDER SURGERY  1970  . GASTRIC BYPASS  2010   lost 178 lbs and regained 40 lbs last few years  . HUMERUS FRACTURE SURGERY Right    metal plate with screws  . internal bleeding  2016   ulcer in past  . LOWER EXTREMITY ANGIOGRAM Left 07/08/2018   Procedure: LOWER EXTREMITY ANGIOGRAM;  Surgeon: Algernon Huxley, MD;  Location: [facility] ORS;  Service: Vascular;  Laterality: Left;  . LOWER EXTREMITY ANGIOGRAPHY Left 05/27/2018   Procedure: LOWER EXTREMITY ANGIOGRAPHY;  Surgeon: Algernon Huxley, MD;  Location: [facility];  Service: Cardiovascular;  Laterality:  Left;  . LOWER EXTREMITY ANGIOGRAPHY Right 12/07/2018   Procedure: LOWER EXTREMITY ANGIOGRAPHY;  Surgeon: Algernon Huxley, MD;  Location: [place] [facility];  Service: Cardiovascular;  Laterality: Right;  . LOWER EXTREMITY ANGIOGRAPHY Left 05/10/2019   Procedure: LOWER EXTREMITY ANGIOGRAPHY;  Surgeon: Algernon Huxley, MD;  Location: [place] [facility];  Service: Cardiovascular;  Laterality: Left;  . MOUTH SURGERY     root canals and crowns and extractions  . OVARY SURGERY    . SKIN GRAFT Right 2018   RT foot. foot has been rebuilt.  it is full of metal  . TENOTOMY ACHILLES TENDON Right    Percuntaneous. metal in foot  . TONSILLECTOMY    . WOUND DEBRIDEMENT Left 07/23/2018   Procedure: DEBRIDEMENT WOUND;  Surgeon: Algernon Huxley, MD;  Location: [facility] ORS;  Service: Vascular;  Laterality: Left;    There were no vitals filed for this visit.  Subjective Assessment - 06/09/19 1401    Subjective  Patient reports she is feeling okay today with back pain 4/10 pain and R knee 5/10 and has been bothering her since last treatment session. She has been working on her walking HEP since last session and did 2 times around her home yesterday.    Pertinent History  Patient is a 73 y.o. female who presents to outpatient physical therapy with a referral for medical diagnosis of degeneration of lumbar intervertebral disc. This patient's chief complaints consist of chronic low back pain and foot pain, weakness, stiffness, leading to the following functional deficits: difficulty with with basic ADLs, IADLs, ambulation. She used to get injections to help control back pain but it has recently worsened due to being unable to get injections during [place] pandemic. The patient has been informed of current processes in place at [facility]patient [facility] to protect patients from Covid-19 exposure including social distancing, schedule modifications, and new cleaning procedures. After discussing their particular risk with a therapist based  on the patient's personal risk factors, the patient has decided to proceed with in-person therapy. Pt is scheduled for R LE angiography on 12/07/2018. She has recently started smoking again with the stress of COVID19 pandemic and feels strongly she needs to stop. Surgeon has stated in the past he will not complete surgery if she is smoking.  Relevant past medical history and comorbidities include underwent extensive vascular surgeries in Jan 2020 to improve blood flow, similar surgery for R leg scheduled 12/07/2018. Has a history of left carotid artery stenosis, but not enough to have surgery, has a history of multiple arterial surgeries, history of R cataract surgery, colon surgery, cholecystectomy,  R foot skin graft, ovary surgery, gastric bypass, R foot fusion, abdominal hysterectomy; current smoker, plantar fasciitis, peripheral arterial disease, LVH, diabetes mellitus with neuropathy, irregular heartbeat, hiatal hernia, GERD, stenosis of left heart ventricle, depression, lumbar degenerative disc disease, cardiac murmur, asthma, anxiety, obesity, HTN, and seasonal allergies She has been advised not to lift over 10 pounds by physician    Limitations  Sitting;Standing;Walking;House hold activities;Lifting    How long can you sit comfortably?  > 1 hour in almost any chair    How long can you stand comfortably?  10-15 min while cooking    How long can you walk comfortably?  2 times to the mailbox and back (120 feet to mailbox) with single point cane. Can ambulate 1 hour with rollator with seated resting breaks.    Diagnostic tests  MRI lumbar spine report 06/14/2017: "IMPRESSION: 1. Stable degenerative of lumbar spondylosis and scoliosis with multilevel disc disease and facet disease. 2. Stable right foraminal stenosis at L5-S1 due to right-sided disc    Patient Stated Goals  patient would like to get back to PLOF, walking further, be better able to participate in dog showing and trips    Currently in Pain?  Yes     Pain Score  5     Pain Onset  More than a month ago       TREATMENT  Patient goal until next session: 2 laps walking around outside of home each day.   Therapeutic exercise:to centralize symptoms and improve ROM, strength, muscular endurance, and activity tolerance required for successful completion of functional activities.  NuSteplevel2 usingBLEsonly.Seatsetting 8. For improved extremity mobility, muscular endurance, and activity tolerance; and to induce the analgesic effect of aerobic exercise, stimulate improved joint nutrition, and prepare body structures and systems for following interventions.during subjective exam.Discomfort in R knee and unable to advance to level 3 today due to R knee pain. Average SPM =63.x6 min during subjective exam.   Sit <> stand from 22.5 inch seat with no UE support, cuing for glute contraction. 2x15.  Seated exercises with trunk unsupported sitting on dynadisc to improve core strength:  - B shoulder flexion, 2x10 (3#) - B shoulder abduction, 2x10 (3#) - B horizontal shoulder abduction, yellow theraband loop, x15, x 12  - pallof press, red theraband loop, 2x10 each side - seated marching without UE support, 2x1 min - Rows against green theraband, x30  Multimodal cuing for form, purpose of exercise. Education about HEP with education and discussion about goals for HEP until next visit. Assistance with holding band.    HOME EXERCISE PROGRAM Access Code: [email or web address]  URL: [email or web address]  Date: 05/18/2019  Prepared by: Rosita Kea   Exercises  Walking - 1.5 to the mailbox and back - 1.5 2 minutes in the house - 1x daily - 7x weekly  Leg stretch - 10 reps - 3 seconds hold - 2x daily - 7x weekly  Seated Shoulder Flexion with Dumbbells - 2 sets - 5 reps - 1 second hold - 1x daily - 7x weekly  Seated Shoulder Abduction with Dumbbells - Thumbs Up - 2 sets - 5 reps - 1 second hold - 1x daily - 7x weekly  Seated  Shoulder Horizontal Abduction with Resistance - 2 sets - 10 reps - 1 second hold - 1x daily - 7x weekly     PT Education - 06/09/19 1402    Education Details  Exercise purpose/form. Self management techniques.    Person(s) Educated  Patient    Methods  Explanation;Demonstration;Tactile cues;Verbal cues    Comprehension  Verbalized understanding;Returned demonstration;Verbal cues required;Tactile cues required;Need further instruction       PT Short Term Goals - 05/31/19 2104      PT SHORT TERM GOAL #1   Title  Be independent with initial home exercise program for self-management of symptoms.    Baseline  Initial HEP provided at [facility] (11/17/2018); was completing until vascular procedure, re-instated 12/15/2018 (12/15/2018); currently participating (01/26/2019); Partially doing HEP due the patient's recent health condition/weakness(03/23/2019); Patient has been doing her HEP every other day. 04/20/2019; has returned to completing her walking HEP as prescribed (05/31/2019);    Time  2    Period  Weeks    Status  Achieved    Target Date  05/04/19        PT Long Term Goals - 05/31/19 2104      PT LONG TERM GOAL #1   Title  Be independent with a long-term home exercise program for self-management of symptoms.     Baseline  Initial HEP provided at [facility] (11/17/2018); was doing better before vascular surgery, is working at returning to [facility] since procedure (12/15/2018); currently participating but not assigned final long term HEP yet (01/26/2019); currently participating but not assigned final long term HEP yet (03/23/2019); HEP progressing (04/20/2019; 05/31/2019);    Time  12    Period  Weeks    Status  Partially Met    Target Date  08/23/19      PT LONG TERM GOAL #2   Title  Patient will improve 6MWT to equal or greater than 1000 feet with LRAD to demonstrate improved activity tolerance for community ambulation.     Baseline  6 Minute Walk Test: 200 feet with SPC and supervision. Stopped at 1:43  due to fatigue and leg pain. (11/17/2018); 457 feet SPC, on seated and one standing break (12/15/2018); 547 with SPC, 2 seated and one standing rest break (01/26/2019); 468 feet with SPC, 2 seated breaks(03/23/2019); 638 feet with SPC, 2 seated breaks; 747 feet with SPC and 2 standing rest breaks (05/31/2019);    Time  12    Period  Weeks    Status  Partially Met    Target Date  08/23/19      PT LONG TERM GOAL #3   Title  Patient will demonstrate improved 5 Times Sit to Stand test to 11 seconds from chair height with no UE support to demonstrate improvement in LE power and functional strength for daily activities.    Baseline  5 Time Sit to Stand: 27 seconds from chair height no UE support (11/17/2018); 18 seconds, no UE support from chair-high plinth (12/15/2018); 16 seconds with no UE support from chair (01/26/2019); 17.45s with lack of knee extension in standing(03/23/2019); 14.03s without BUEs help (04/20/2019); 16 seconds without BUE help from 18.5 inch seat (05/31/2019);    Time  12    Period  Weeks    Status  Partially Met    Target Date  08/23/19      PT LONG TERM GOAL #4   Title  Patient will ambulate faster than 1.81ms on the 10 Meter Walk Test to improve ability to cross street safey for community participation(05/21/2019); 210m with SPC (05/31/2019);    Baseline  testing deferred to next session (11/17/2018); 1.37 m/sec with SPC (11/24/2018); 1.3 m/sec with SPC (12/15/2018; 01/26/2019); 0.4724m(03/23/2019); 0.98 m/sec with SPC (04/20/2019); 1 m/sec (05/31/2019);    Time  12    Period  Weeks  Status  Partially Met    Target Date  08/23/19      PT LONG TERM GOAL #5   Title  Patient will score equal or greater than  25/30 on the Functional Gait Assessment to demonstrate low fall risk to improve her ability to participate safely in community activities.     Baseline  testing deferred to next session (11/17/2018); 11/30 high fall risk (11/24/2018); unable to measure due to time limitation  (12/15/2018); 13/30 high fall risk (12/17/2018); 14/30 high fall risk (01/26/2019); 14/30 high fall risk(03/23/2019); 17/30 high fall risk (04/20/2019); 21/30 moderate fall risk (05/31/2019);    Time  12    Period  Weeks    Status  Partially Met    Target Date  08/23/19      PT LONG TERM GOAL #6   Title  Reduce pain to equal or less than 4/10 with functional activities to allow patient to complete valued functional tasks such as caring for dogs, grocery shopping, walking with less difficulty.(03/23/2019);    Baseline  8/10 (01/26/2019); 6/10 (03/23/2019); 4/10 (04/20/2019); 4/10 (05/31/2019);    Time  12    Period  Weeks    Status  Partially Met    Target Date  08/23/19            Plan - 06/09/19 1412    Clinical Impression Statement  Patient tolerated treatment well overall with some difficulty due to pain in the R knee and low back. Session focused on LE strengthening and improved activity tolerance as well as core strength to improve functional mobility and decrease pain. Patient required cuing and close monitoring to ensure safety as she is at an elevated fall risk. Patient would benefit from continued physical therapy to address remaining impairments and functional limitations to work towards stated goals and return to PLOF or maximal functional independence.    Personal Factors and Comorbidities  Age;Comorbidity 3+;Fitness;Time since onset of injury/illness/exacerbation;Past/Current Experience;Other    Comorbidities  underwent extensive vascular surgeries in Jan 2020 to improve blood flow, similar surgery for R leg scheduled 12/07/2018. Has a history of left carotid artery stenosis, but not enough to have surgery, has a history of multiple arterial surgeries, history of R cataract surgery, colon surgery, cholecystectomy, R foot skin graft, ovary surgery, gastric bypass, R foot fusion, abdominal hysterectomy; current smoker, plantar fasciitis, peripheral arterial disease, LVH, diabetes  mellitus with neuropathy, irregular heartbeat, hiatal hernia, GERD, stenosis of left heart ventricle, depression, lumbar degenerative disc disease, cardiac murmur, asthma, anxiety, obesity, HTN, and seasonal allergies She has been advised not to lift over 10 pounds by physician.      Examination-Activity Limitations  Bathing;Carry;Lift;Sit;Stand;Locomotion Level;Toileting;Dressing;Squat;Transfers;Stairs;Hygiene/Grooming;Other    Examination-Participation Restrictions  Community Activity;Volunteer;Cleaning;Interpersonal Relationship;Yard Work;Other    Rehab Potential  Fair    Clinical Impairments Affecting Rehab Potential  (+) motivated (-) muliple co morbidities, chronic condition    PT Frequency  2x / week    PT Duration  12 weeks    PT Treatment/Interventions  Moist Heat;Patient/family education;Neuromuscular re-education;Therapeutic exercise;Manual techniques;ADLs/Self Care Home Management;Electrical Stimulation;Gait training;Stair training;Functional mobility training;Therapeutic activities;Balance training;Passive range of motion;Dry needling;Energy conservation;Spinal Manipulations;Joint Manipulations;Cryotherapy    PT Next Visit Plan  strengthening, pain control, and promotion of self-management techniques    PT Home Exercise Plan  Medbridge Access Code: [email or web address]    Consulted and Agree with Plan of Care  Patient       Patient will benefit from skilled therapeutic intervention in order to improve the following deficits and impairments:  Decreased strength, Impaired flexibility, Decreased activity tolerance, Impaired perceived functional ability, Pain, Decreased endurance, Difficulty walking, Abnormal gait, Decreased knowledge of use of DME, Decreased skin integrity, Decreased range of motion, Impaired sensation, Improper body mechanics, Obesity, Postural dysfunction, Increased edema, Decreased mobility, Decreased balance, Cardiopulmonary status limiting activity, Decreased coordination  Visit  Diagnosis: Muscle weakness (generalized)  Chronic bilateral low back pain, unspecified whether sciatica present  Difficulty in walking, not elsewhere classified  History of falling     Problem List Patient Active Problem List   Diagnosis Date Noted  . Osteopenia 03/03/2019  . Lower extremity edema 07/31/2018  . Pressure injury of skin 07/24/2018  . Surgical site infection 07/23/2018  . Wound of left leg 07/23/2018  . Atherosclerosis of artery of extremity with rest pain ([place]) 07/08/2018  . Shortness of breath 06/02/2018  . Bruit 06/02/2018  . Coronary artery disease of native artery of native heart with stable angina pectoris ([place]) 06/02/2018  . Atherosclerosis of native arteries of extremity with intermittent claudication ([place]) 05/19/2018  . Peripheral arterial disease ([place]) 05/06/2018  . Personal history of colonic polyps   . Aortic stenosis 12/24/2017  . LVH (left ventricular hypertrophy) 12/24/2017  . Hypertension with heart disease 12/24/2017  . Left atrial dilatation 12/12/2017  . Cardiac murmur 12/12/2017  . Gastrojejunal ulcer 12/04/2017  . Hyperphosphatemia 12/04/2017  . Spinal stenosis of lumbar region 06/18/2017  . Degeneration of lumbar intervertebral disc 06/02/2017  . Assistance needed with transportation 05/26/2017  . Financial difficulties 05/26/2017  . Needs assistance with community resources 05/26/2017  . Moderate recurrent major depression ([place]) 05/26/2017  . Non-healing wound of lower extremity 02/06/2017  . Status post ankle arthrodesis 12/11/2016  . Controlled substance agreement broken 10/11/2016  . Low back pain 09/25/2016  . Osteoarthritis of right subtalar joint 07/11/2016  . Posterior tibial tendinitis of right lower extremity 07/11/2016  . Breast cancer screening 06/18/2016  . Knee pain 04/03/2016  . Hyponatremia 03/01/2016  . High triglycerides 12/24/2015  . Pes planus of both feet 11/16/2015  . Plantar fasciitis of right foot  11/16/2015  . Heel spur 11/16/2015  . Diabetic neuropathy ([place]) 11/16/2015  . Right ankle pain 11/16/2015  . Medication monitoring encounter 11/16/2015  . Chronic pain of multiple joints 08/15/2015  . Abnormal mammogram of right breast 06/19/2015  . Cerumen impaction 03/16/2015  . Hyperlipidemia 12/14/2014  . History of transfusion of packed RBC 12/14/2014  . Hx of smoking 12/14/2014  . Status post bariatric surgery 12/14/2014  . Morbid obesity ([place]) 12/14/2014  . Chronic radicular lumbar pain 12/14/2014  . History of GI bleed 11/12/2014  . History of small bowel obstruction 09/07/2014    Everlean Alstrom. [name], PT, DPT 06/09/19, 2:24 PM  [facility] [address] [place], [address] Phone: [phone]   Fax:  [phone]  Name: NICOLE DEFINO MRN: [id number] Date of Birth: December 03, 1945

## 2019-06-14 ENCOUNTER — Other Ambulatory Visit (INDEPENDENT_AMBULATORY_CARE_PROVIDER_SITE_OTHER): Payer: Self-pay | Admitting: Vascular Surgery

## 2019-06-14 ENCOUNTER — Other Ambulatory Visit: Payer: Self-pay

## 2019-06-14 ENCOUNTER — Ambulatory Visit: Payer: Medicare Other | Admitting: Physical Therapy

## 2019-06-14 DIAGNOSIS — Z9181 History of falling: Secondary | ICD-10-CM

## 2019-06-14 DIAGNOSIS — M6281 Muscle weakness (generalized): Secondary | ICD-10-CM | POA: Diagnosis not present

## 2019-06-14 DIAGNOSIS — Z95820 Peripheral vascular angioplasty status with implants and grafts: Secondary | ICD-10-CM

## 2019-06-14 DIAGNOSIS — I70222 Atherosclerosis of native arteries of extremities with rest pain, left leg: Secondary | ICD-10-CM

## 2019-06-14 DIAGNOSIS — G8929 Other chronic pain: Secondary | ICD-10-CM

## 2019-06-14 DIAGNOSIS — R262 Difficulty in walking, not elsewhere classified: Secondary | ICD-10-CM

## 2019-06-14 NOTE — Therapy (Signed)
[facility] [address], [place], [address] Phone: [phone]   Fax:  [phone]  Physical Therapy Treatment  Patient Details  Name: Andrea Holden MRN: [id number] Date of Birth: 1945-12-11 Referring Provider (PT): Fredderick Severance, NP    Encounter Date: 06/14/2019  PT End of Session - 06/14/19 1409    Visit Number  45    Number of Visits  65    Date for PT Re-Evaluation  08/23/19    Authorization Type  [facility] Medicare reporting period from 05/31/2019    Authorization - Visit Number  5    Authorization - Number of Visits  10    PT Start Time  1350    PT Stop Time  1430    PT Time Calculation (min)  40 min    Equipment Utilized During Treatment  Gait belt    Activity Tolerance  Patient limited by pain;Patient tolerated treatment well;Patient limited by fatigue    Behavior During Therapy  [facility] for tasks assessed/performed       Past Medical History:  Diagnosis Date  . Allergy    seasonal allergies  . Anemia 2014   needed 5 units of blood d/t passing out, weak  . Anxiety   . Aortic stenosis 12/24/2017   Echo Aug 2018  . Arthritis   . Asthma    allergy induced asthma  . Cardiac murmur 12/12/2017  . Cataract   . Cataract    left  . Complication of anesthesia    arrhythmia following colonoscopy  . Degenerative disc disease, lumbar   . Degenerative disc disease, lumbar   . Depression   . Diabetes mellitus   . Diabetic neuropathy ([place]) 11/16/2015  . Dysrhythmia    stenosis of left ventricle  . GERD (gastroesophageal reflux disease)   . H/O transfusion    patient was given 5 units of blood while at [place], blood type O+  . History of chicken pox   . History of hiatal hernia   . History of measles, mumps, or rubella   . HOH (hard of hearing)    does not use hearing aides yet  . Hyperlipidemia   . Hypertension   . Irregular heartbeat   . LVH (left ventricular hypertrophy) 12/24/2017    Echo Aug 2018  . Neuropathy   . Opiate use 11/16/2015  . Peripheral arterial disease ([place]) 05/06/2018   At rest, left > right; refer to vasc  . Pes planus of both feet 11/16/2015  . Plantar fasciitis of right foot 11/16/2015  . Wheezing     Past Surgical History:  Procedure Laterality Date  . ABDOMINAL HYSTERECTOMY    . APPLICATION OF WOUND VAC Left 07/23/2018   Procedure: APPLICATION OF WOUND VAC;  Surgeon: Algernon Huxley, MD;  Location: [facility] ORS;  Service: Vascular;  Laterality: Left;  . banck injections    . CATARACT EXTRACTION W/PHACO Right 05/30/2015   Procedure: CATARACT EXTRACTION PHACO AND INTRAOCULAR LENS PLACEMENT (IOC);  Surgeon: Birder Robson, MD;  Location: [facility] ORS;  Service: Ophthalmology;  Laterality: Right;  [place] 00:57 AP% 20.9 CDE 11.99 fluid pack lot #[id number]  . CHOLECYSTECTOMY  1970  . COLON SURGERY  2013   blocked colon  . COLONOSCOPY WITH PROPOFOL N/A 01/20/2018   Procedure: COLONOSCOPY WITH PROPOFOL;  Surgeon: Lucilla Lame, MD;  Location: [facility] ENDOSCOPY;  Service: Endoscopy;  Laterality: N/A;  . ENDARTERECTOMY FEMORAL Left 07/08/2018   Procedure: ENDARTERECTOMY FEMORAL;  Surgeon:  Algernon Huxley, MD;  Location: [facility] ORS;  Service: Vascular;  Laterality: Left;  . EYE SURGERY Right    cataract surgery  . FOOT FUSION Right 2018   metal in foot  . GALLBLADDER SURGERY  1970  . GASTRIC BYPASS  2010   lost 178 lbs and regained 40 lbs last few years  . HUMERUS FRACTURE SURGERY Right    metal plate with screws  . internal bleeding  2016   ulcer in past  . LOWER EXTREMITY ANGIOGRAM Left 07/08/2018   Procedure: LOWER EXTREMITY ANGIOGRAM;  Surgeon: Algernon Huxley, MD;  Location: [facility] ORS;  Service: Vascular;  Laterality: Left;  . LOWER EXTREMITY ANGIOGRAPHY Left 05/27/2018   Procedure: LOWER EXTREMITY ANGIOGRAPHY;  Surgeon: Algernon Huxley, MD;  Location: [facility] CV LAB;  Service: Cardiovascular;  Laterality: Left;  . LOWER EXTREMITY ANGIOGRAPHY Right 12/07/2018   Procedure:  LOWER EXTREMITY ANGIOGRAPHY;  Surgeon: Algernon Huxley, MD;  Location: [facility];  Service: Cardiovascular;  Laterality: Right;  . LOWER EXTREMITY ANGIOGRAPHY Left 05/10/2019   Procedure: LOWER EXTREMITY ANGIOGRAPHY;  Surgeon: Algernon Huxley, MD;  Location: [facility] CV LAB;  Service: Cardiovascular;  Laterality: Left;  . MOUTH SURGERY     root canals and crowns and extractions  . OVARY SURGERY    . SKIN GRAFT Right 2018   RT foot. foot has been rebuilt.  it is full of metal  . TENOTOMY ACHILLES TENDON Right    Percuntaneous. metal in foot  . TONSILLECTOMY    . WOUND DEBRIDEMENT Left 07/23/2018   Procedure: DEBRIDEMENT WOUND;  Surgeon: Algernon Huxley, MD;  Location: [facility] ORS;  Service: Vascular;  Laterality: Left;    There were no vitals filed for this visit.  Subjective Assessment - 06/14/19 1358    Subjective  Patient reports her R knee and low back is hurting about 6/10 in both places. States she felt fine following last treatment session. She reports the visiting nurse came and measured her A1C and her number had improved from over 6 to 5 point something. She is feeling okay overall except soreness from standing and bending while baking. States her family she was going to get together with over christmas has all tested positive for COVID, so she will be alone for Christmas. She has not seen any of them for several weeks. She has been walking around the home outside at least once a day and she went aroud the house twice 3 times since last treatment session. She has also walked to the mailbox several times, but not every day.    Pertinent History  Patient is a 73 y.o. female who presents to outpatient physical therapy with a referral for medical diagnosis of degeneration of lumbar intervertebral disc. This patient's chief complaints consist of chronic low back pain and foot pain, weakness, stiffness, leading to the following functional deficits: difficulty with with basic ADLs, IADLs,  ambulation. She used to get injections to help control back pain but it has recently worsened due to being unable to get injections during [place] pandemic. The patient has been informed of current processes in place at [facility]patient [facility]hab to protect patients from Covid-19 exposure including social distancing, schedule modifications, and new cleaning procedures. After discussing their particular risk with a therapist based on the patient's personal risk factors, the patient has decided to proceed with in-person therapy. Pt is scheduled for R LE angiography on 12/07/2018. She has recently started smoking again with the stress of  COVID19 pandemic and feels strongly she needs to stop. Surgeon has stated in the past he will not complete surgery if she is smoking.  Relevant past medical history and comorbidities include underwent extensive vascular surgeries in Jan 2020 to improve blood flow, similar surgery for R leg scheduled 12/07/2018. Has a history of left carotid artery stenosis, but not enough to have surgery, has a history of multiple arterial surgeries, history of R cataract surgery, colon surgery, cholecystectomy, R foot skin graft, ovary surgery, gastric bypass, R foot fusion, abdominal hysterectomy; current smoker, plantar fasciitis, peripheral arterial disease, LVH, diabetes mellitus with neuropathy, irregular heartbeat, hiatal hernia, GERD, stenosis of left heart ventricle, depression, lumbar degenerative disc disease, cardiac murmur, asthma, anxiety, obesity, HTN, and seasonal allergies She has been advised not to lift over 10 pounds by physician    Limitations  Sitting;Standing;Walking;House hold activities;Lifting    How long can you sit comfortably?  > 1 hour in almost any chair    How long can you stand comfortably?  10-15 min while cooking    How long can you walk comfortably?  2 times to the mailbox and back (120 feet to mailbox) with single point cane. Can ambulate 1 hour with rollator with seated  resting breaks.    Diagnostic tests  MRI lumbar spine report 06/14/2017: "IMPRESSION: 1. Stable degenerative of lumbar spondylosis and scoliosis with multilevel disc disease and facet disease. 2. Stable right foraminal stenosis at L5-S1 due to right-sided disc    Patient Stated Goals  patient would like to get back to PLOF, walking further, be better able to participate in dog showing and trips    Currently in Pain?  Yes    Pain Score  6     Pain Onset  More than a month ago       TREATMENT  Patient goal until next session: 2 laps walking around outside of home each day.  Therapeutic exercise:to centralize symptoms and improve ROM, strength, muscular endurance, and activity tolerance required for successful completion of functional activities.  Mechanical NuSteplevel1-2 usingBLEsonly first 3 min then with BUE and BLE.Seatsetting 8. For improved extremity mobility, muscular endurance, and activity tolerance; and to induce the analgesic effect of aerobic exercise, stimulate improved joint nutrition, and prepare body structures and systems for following interventions.during subjective exam. Average SPM = unable to determine today due to mechanical machine. x6 min during subjective exam.   Sit <> stand from 21 inch seat with no UE support, cuing for glute contraction. 2x15.  Cuing for improved effort, purpose of exercise, translation to personal functional goals. Education about HEP.  Neuromuscular Re-education: to improve, balance, postural strength, muscle activation patterns, and stabilization strength required for functional activities: - semi-tandem stance ball toss at rebounder 3x10 each side plus x10 with self selected stance for warm up and to get used to the rebounder. CGA - min A for balance. Required two sitting rests due to fatigue. R knee pain that patient was able to manage by moving to different position.  - standing foot taps on 3-inch spike half-ball, alternating  feet and towards front or diagonal front with random cuing from PT to touch different colors of ball and which foot. X 20 taps each foot. Also attempted with 6 inch cones, but patient unable to safely complete with R foot. CGA  - min A for safety and to prevent falling. L hip weakness evident with each R LE tap.   HOME EXERCISE PROGRAM Access Code: [email or web address]  URL: [email or web address]  Date: 05/18/2019  Prepared by: Rosita Kea   Exercises  Walking - 1.5 to the mailbox and back - 1.5 2 minutes in the house - 1x daily - 7x weekly  Leg stretch - 10 reps - 3 seconds hold - 2x daily - 7x weekly  Seated Shoulder Flexion with Dumbbells - 2 sets - 5 reps - 1 second hold - 1x daily - 7x weekly  Seated Shoulder Abduction with Dumbbells - Thumbs Up - 2 sets - 5 reps - 1 second hold - 1x daily - 7x weekly  Seated Shoulder Horizontal Abduction with Resistance - 2 sets - 10 reps - 1 second hold - 1x daily - 7x weekly   PT Education - 06/14/19 1806    Education provided  Yes    Education Details  Exercise purpose/form. Self management techniques.    Person(s) Educated  Patient    Methods  Explanation;Demonstration;Tactile cues;Verbal cues    Comprehension  Verbalized understanding;Returned demonstration;Verbal cues required;Tactile cues required;Need further instruction       PT Short Term Goals - 05/31/19 2104      PT SHORT TERM GOAL #1   Title  Be independent with initial home exercise program for self-management of symptoms.    Baseline  Initial HEP provided at IE (11/17/2018); was completing until vascular procedure, re-instated 12/15/2018 (12/15/2018); currently participating (01/26/2019); Partially doing HEP due the patient's recent health condition/weakness(03/23/2019); Patient has been doing her HEP every other day. 04/20/2019; has returned to completing her walking HEP as prescribed (05/31/2019);    Time  2    Period  Weeks    Status  Achieved    Target Date  05/04/19         PT Long Term Goals - 05/31/19 2104      PT LONG TERM GOAL #1   Title  Be independent with a long-term home exercise program for self-management of symptoms.     Baseline  Initial HEP provided at [facility] (11/17/2018); was doing better before vascular surgery, is working at returning to [facility] since procedure (12/15/2018); currently participating but not assigned final long term HEP yet (01/26/2019); currently participating but not assigned final long term HEP yet (03/23/2019); HEP progressing (04/20/2019; 05/31/2019);    Time  12    Period  Weeks    Status  Partially Met    Target Date  08/23/19      PT LONG TERM GOAL #2   Title  Patient will improve 6MWT to equal or greater than 1000 feet with LRAD to demonstrate improved activity tolerance for community ambulation.     Baseline  6 Minute Walk Test: 200 feet with SPC and supervision. Stopped at 1:43 due to fatigue and leg pain. (11/17/2018); 457 feet SPC, on seated and one standing break (12/15/2018); 547 with SPC, 2 seated and one standing rest break (01/26/2019); 468 feet with SPC, 2 seated breaks(03/23/2019); 638 feet with SPC, 2 seated breaks; 747 feet with SPC and 2 standing rest breaks (05/31/2019);    Time  12    Period  Weeks    Status  Partially Met    Target Date  08/23/19      PT LONG TERM GOAL #3   Title  Patient will demonstrate improved 5 Times Sit to Stand test to 11 seconds from chair height with no UE support to demonstrate improvement in LE power and functional strength for daily activities.    Baseline  5 Time Sit to Stand: 27 seconds from chair height no  UE support (11/17/2018); 18 seconds, no UE support from chair-high plinth (12/15/2018); 16 seconds with no UE support from chair (01/26/2019); 17.45s with lack of knee extension in standing(03/23/2019); 14.03s without BUEs help (04/20/2019); 16 seconds without BUE help from 18.5 inch seat (05/31/2019);    Time  12    Period  Weeks    Status  Partially Met    Target Date  08/23/19       PT LONG TERM GOAL #4   Title  Patient will ambulate faster than 1.72ms on the 10 Meter Walk Test to improve ability to cross street safey for community participation(05/21/2019); 283m with SPC (05/31/2019);    Baseline  testing deferred to next session (11/17/2018); 1.37 m/sec with SPC (11/24/2018); 1.3 m/sec with SPC (12/15/2018; 01/26/2019); 0.4780m(03/23/2019); 0.98 m/sec with SPC (04/20/2019); 1 m/sec (05/31/2019);    Time  12    Period  Weeks    Status  Partially Met    Target Date  08/23/19      PT LONG TERM GOAL #5   Title  Patient will score equal or greater than  25/30 on the Functional Gait Assessment to demonstrate low fall risk to improve her ability to participate safely in community activities.     Baseline  testing deferred to next session (11/17/2018); 11/30 high fall risk (11/24/2018); unable to measure due to time limitation (12/15/2018); 13/30 high fall risk (12/17/2018); 14/30 high fall risk (01/26/2019); 14/30 high fall risk(03/23/2019); 17/30 high fall risk (04/20/2019); 21/30 moderate fall risk (05/31/2019);    Time  12    Period  Weeks    Status  Partially Met    Target Date  08/23/19      PT LONG TERM GOAL #6   Title  Reduce pain to equal or less than 4/10 with functional activities to allow patient to complete valued functional tasks such as caring for dogs, grocery shopping, walking with less difficulty.(03/23/2019);    Baseline  8/10 (01/26/2019); 6/10 (03/23/2019); 4/10 (04/20/2019); 4/10 (05/31/2019);    Time  12    Period  Weeks    Status  Partially Met    Target Date  08/23/19            Plan - 06/14/19 1805    Clinical Impression Statement  Patient tolerated treatment well overall with some difficulty due to R knee pain. Demonstrated significant weakness in L hip when weight bearing on L LE attempting to use R LE to tap cone/half-ball. Discussed this with patient and how this likely leads to R knee pain. Session focused on LE functional strength, balance  to improve fall risk, and activity tolerance to support patient's goals of preventing falls, improving balance, and walking more. Patient required frequent cuing to stay on task, fatigued quickly and required several seated rests, and physical assistance to prevent falls when not using cane. Patient would benefit from continued physical therapy to address remaining impairments and functional limitations to work towards stated goals and return to PLOF or maximal functional independence.    Personal Factors and Comorbidities  Age;Comorbidity 3+;Fitness;Time since onset of injury/illness/exacerbation;Past/Current Experience;Other    Comorbidities  underwent extensive vascular surgeries in Jan 2020 to improve blood flow, similar surgery for R leg scheduled 12/07/2018. Has a history of left carotid artery stenosis, but not enough to have surgery, has a history of multiple arterial surgeries, history of R cataract surgery, colon surgery, cholecystectomy, R foot skin graft, ovary surgery, gastric bypass, R foot fusion, abdominal hysterectomy; current smoker, plantar  fasciitis, peripheral arterial disease, LVH, diabetes mellitus with neuropathy, irregular heartbeat, hiatal hernia, GERD, stenosis of left heart ventricle, depression, lumbar degenerative disc disease, cardiac murmur, asthma, anxiety, obesity, HTN, and seasonal allergies She has been advised not to lift over 10 pounds by physician.      Examination-Activity Limitations  Bathing;Carry;Lift;Sit;Stand;Locomotion Level;Toileting;Dressing;Squat;Transfers;Stairs;Hygiene/Grooming;Other    Examination-Participation Restrictions  Community Activity;Volunteer;Cleaning;Interpersonal Relationship;Yard Work;Other    Rehab Potential  Fair    Clinical Impairments Affecting Rehab Potential  (+) motivated (-) muliple co morbidities, chronic condition    PT Frequency  2x / week    PT Duration  12 weeks    PT Treatment/Interventions  Moist Heat;Patient/family  education;Neuromuscular re-education;Therapeutic exercise;Manual techniques;ADLs/Self Care Home Management;Electrical Stimulation;Gait training;Stair training;Functional mobility training;Therapeutic activities;Balance training;Passive range of motion;Dry needling;Energy conservation;Spinal Manipulations;Joint Manipulations;Cryotherapy    PT Next Visit Plan  strengthening, pain control, and promotion of self-management techniques    PT Home Exercise Plan  Medbridge Access Code: [email or web address]    Consulted and Agree with Plan of Care  Patient       Patient will benefit from skilled therapeutic intervention in order to improve the following deficits and impairments:  Decreased strength, Impaired flexibility, Decreased activity tolerance, Impaired perceived functional ability, Pain, Decreased endurance, Difficulty walking, Abnormal gait, Decreased knowledge of use of DME, Decreased skin integrity, Decreased range of motion, Impaired sensation, Improper body mechanics, Obesity, Postural dysfunction, Increased edema, Decreased mobility, Decreased balance, Cardiopulmonary status limiting activity, Decreased coordination  Visit Diagnosis: Muscle weakness (generalized)  Chronic bilateral low back pain, unspecified whether sciatica present  Difficulty in walking, not elsewhere classified  History of falling     Problem List Patient Active Problem List   Diagnosis Date Noted  . Osteopenia 03/03/2019  . Lower extremity edema 07/31/2018  . Pressure injury of skin 07/24/2018  . Surgical site infection 07/23/2018  . Wound of left leg 07/23/2018  . Atherosclerosis of artery of extremity with rest pain ([place]) 07/08/2018  . Shortness of breath 06/02/2018  . Bruit 06/02/2018  . Coronary artery disease of native artery of native heart with stable angina pectoris ([place]) 06/02/2018  . Atherosclerosis of native arteries of extremity with intermittent claudication ([place]) 05/19/2018  . Peripheral arterial disease  ([place]) 05/06/2018  . Personal history of colonic polyps   . Aortic stenosis 12/24/2017  . LVH (left ventricular hypertrophy) 12/24/2017  . Hypertension with heart disease 12/24/2017  . Left atrial dilatation 12/12/2017  . Cardiac murmur 12/12/2017  . Gastrojejunal ulcer 12/04/2017  . Hyperphosphatemia 12/04/2017  . Spinal stenosis of lumbar region 06/18/2017  . Degeneration of lumbar intervertebral disc 06/02/2017  . Assistance needed with transportation 05/26/2017  . Financial difficulties 05/26/2017  . Needs assistance with community resources 05/26/2017  . Moderate recurrent major depression ([place]) 05/26/2017  . Non-healing wound of lower extremity 02/06/2017  . Status post ankle arthrodesis 12/11/2016  . Controlled substance agreement broken 10/11/2016  . Low back pain 09/25/2016  . Osteoarthritis of right subtalar joint 07/11/2016  . Posterior tibial tendinitis of right lower extremity 07/11/2016  . Breast cancer screening 06/18/2016  . Knee pain 04/03/2016  . Hyponatremia 03/01/2016  . High triglycerides 12/24/2015  . Pes planus of both feet 11/16/2015  . Plantar fasciitis of right foot 11/16/2015  . Heel spur 11/16/2015  . Diabetic neuropathy ([place]) 11/16/2015  . Right ankle pain 11/16/2015  . Medication monitoring encounter 11/16/2015  . Chronic pain of multiple joints 08/15/2015  . Abnormal mammogram of right breast 06/19/2015  . Cerumen impaction 03/16/2015  .  Hyperlipidemia 12/14/2014  . History of transfusion of packed RBC 12/14/2014  . Hx of smoking 12/14/2014  . Status post bariatric surgery 12/14/2014  . Morbid obesity ([place]) 12/14/2014  . Chronic radicular lumbar pain 12/14/2014  . History of GI bleed 11/12/2014  . History of small bowel obstruction 09/07/2014    Everlean Alstrom. [name], PT, DPT 06/14/19, 6:07 PM  [facility] [address] [place], [address] Phone: [phone]   Fax:   [phone]  Name: Andrea Holden MRN: [id number] Date of Birth: 05-30-1946

## 2019-06-15 ENCOUNTER — Ambulatory Visit (INDEPENDENT_AMBULATORY_CARE_PROVIDER_SITE_OTHER): Payer: Medicare Other | Admitting: Vascular Surgery

## 2019-06-15 ENCOUNTER — Encounter (INDEPENDENT_AMBULATORY_CARE_PROVIDER_SITE_OTHER): Payer: Self-pay | Admitting: Vascular Surgery

## 2019-06-15 ENCOUNTER — Ambulatory Visit (INDEPENDENT_AMBULATORY_CARE_PROVIDER_SITE_OTHER): Payer: Medicare Other

## 2019-06-15 VITALS — BP 184/65 | HR 67 | Resp 16 | Wt 197.0 lb

## 2019-06-15 DIAGNOSIS — I119 Hypertensive heart disease without heart failure: Secondary | ICD-10-CM | POA: Diagnosis not present

## 2019-06-15 DIAGNOSIS — I70229 Atherosclerosis of native arteries of extremities with rest pain, unspecified extremity: Secondary | ICD-10-CM

## 2019-06-15 DIAGNOSIS — E1142 Type 2 diabetes mellitus with diabetic polyneuropathy: Secondary | ICD-10-CM

## 2019-06-15 DIAGNOSIS — I70222 Atherosclerosis of native arteries of extremities with rest pain, left leg: Secondary | ICD-10-CM | POA: Diagnosis not present

## 2019-06-15 DIAGNOSIS — Z95820 Peripheral vascular angioplasty status with implants and grafts: Secondary | ICD-10-CM

## 2019-06-15 DIAGNOSIS — Z720 Tobacco use: Secondary | ICD-10-CM | POA: Diagnosis not present

## 2019-06-15 DIAGNOSIS — I25118 Atherosclerotic heart disease of native coronary artery with other forms of angina pectoris: Secondary | ICD-10-CM

## 2019-06-15 DIAGNOSIS — F17200 Nicotine dependence, unspecified, uncomplicated: Secondary | ICD-10-CM | POA: Diagnosis not present

## 2019-06-15 NOTE — Assessment & Plan Note (Signed)
Her noninvasive studies today show marked improvement in her ABIs at 1.05 on the right and 1.04 on the left with strong digital waveforms bilaterally.  Her symptoms are much improved.  We again discussed the importance of smoking cessation.  She will continue her current medical regimen.  I will plan to see her back in 3 to 4 months with noninvasive studies

## 2019-06-15 NOTE — Assessment & Plan Note (Signed)
blood pressure control important in reducing the progression of atherosclerotic disease. On appropriate oral medications.  

## 2019-06-15 NOTE — Assessment & Plan Note (Signed)
We had a discussion for approximately 3-4 minutes regarding the absolute need for smoking cessation due to the deleterious nature of tobacco on the vascular system. We discussed the tobacco use would diminish patency of any intervention, and likely significantly worsen progressio of disease. We discussed multiple agents for quitting including replacement therapy or medications to reduce cravings such as Chantix.  She has tried Chantix recently without success.  She has previously taken a class at the hospital as well as done Chantix on time she has quit in the past.  The patient voices their understanding of the importance of smoking cessation.  She understands if she continues smoking, all the work we have done to improve her lower extremity perfusion is much more likely to fail.

## 2019-06-15 NOTE — Assessment & Plan Note (Signed)
Continue cardiac and antihypertensive medications as already ordered and reviewed, no changes at this time. Continue statin as ordered and reviewed, no changes at this time Nitrates PRN for chest pain  

## 2019-06-15 NOTE — Progress Notes (Signed)
MRN : [id number]  Andrea Holden is a 73 y.o. (03/27/1946) female who presents with chief complaint of  Chief Complaint  Patient presents with  . Follow-up    [facility] 4week post angio abi  .  History of Present Illness: Patient returns today in follow up of her peripheral arterial disease.  A little over a month ago, she underwent extensive left lower extremity revascularization.  She is already undergone femoral endarterectomies and right SFA intervention earlier this year.  From a circulatory standpoint she is doing quite well.  Her weakness and tiredness in her legs is markedly improved.  No new ulcerations or infections.  She is still bothered by arthritis in her knee but her legs do have a lot more energy.  Her noninvasive studies today show marked improvement in her ABIs at 1.05 on the right and 1.04 on the left with strong digital waveforms bilaterally.  Current Outpatient Medications  Medication Sig Dispense Refill  . acetaminophen (TYLENOL) 325 MG tablet Take 325 mg by mouth every 6 (six) hours as needed.     Marland Kitchen albuterol (VENTOLIN HFA) 108 (90 Base) MCG/ACT inhaler Inhale 2 puffs into the lungs every 6 (six) hours as needed for wheezing or shortness of breath. 18 g 2  . atorvastatin (LIPITOR) 10 MG tablet Take 1 tablet (10 mg total) by mouth at bedtime. 90 tablet 3  . clopidogrel (PLAVIX) 75 MG tablet Take 1 tablet (75 mg total) by mouth daily. 90 tablet 3  . co-enzyme Q-10 30 MG capsule Take 100 mg by mouth daily.     . fluticasone (FLONASE) 50 MCG/ACT nasal spray Place 2 sprays into both nostrils daily as needed. 16 g 4  . gabapentin (NEURONTIN) 400 MG capsule Take 1 capsule (400 mg total) by mouth 2 (two) times daily. 180 capsule 1  . HYDROcodone-acetaminophen (NORCO) 5-325 MG tablet Take 0.5 tablets by mouth every 6 (six) hours as needed for moderate pain. 30 tablet 0  . ipratropium-albuterol (DUONEB) 0.5-2.5 (3) MG/3ML SOLN Take 3 mLs by nebulization 3 (three) times daily as  needed. 180 mL 1  . lisinopril (ZESTRIL) 10 MG tablet TAKE 1 TABLET BY MOUTH ONCE DAILY 90 tablet 1  . Multiple Vitamins-Minerals (MULTIVITAMIN ADULTS 50+) TABS Take 1 tablet by mouth daily. 90 tablet 1  . pantoprazole (PROTONIX) 20 MG tablet Take 1 tablet (20 mg total) by mouth daily. 90 tablet 1  . Respiratory Therapy Supplies (NEBULIZER/TUBING/MOUTHPIECE) KIT Disp one nebulizer machine, tubing set and mouthpiece kit 1 kit 0  . sertraline (ZOLOFT) 100 MG tablet TAKE 1 AND 1/2 TABLETS (150 MG TOTAL) BYMOUTH DAILY 135 tablet 1  . Turmeric 500 MG CAPS Take by mouth.    . umeclidinium-vilanterol (ANORO ELLIPTA) 62.5-25 MCG/INH AEPB Inhale 1 puff into the lungs daily. 60 each 2  . varenicline (CHANTIX) 1 MG tablet Take 1 tablet (1 mg total) by mouth 2 (two) times daily. 60 tablet 2  . vitamin C (ASCORBIC ACID) 500 MG tablet Take 500 mg by mouth daily.     . Zinc Sulfate (ZINC 15 PO) Take by mouth.     No current facility-administered medications for this visit.    Past Medical History:  Diagnosis Date  . Allergy    seasonal allergies  . Anemia 2014   needed 5 units of blood d/t passing out, weak  . Anxiety   . Aortic stenosis 12/24/2017   Echo Aug 2018  . Arthritis   . Asthma  allergy induced asthma  . Cardiac murmur 12/12/2017  . Cataract   . Cataract    left  . Complication of anesthesia    arrhythmia following colonoscopy  . Degenerative disc disease, lumbar   . Degenerative disc disease, lumbar   . Depression   . Diabetes mellitus   . Diabetic neuropathy ([place]) 11/16/2015  . Dysrhythmia    stenosis of left ventricle  . GERD (gastroesophageal reflux disease)   . H/O transfusion    patient was given 5 units of blood while at [place], blood type O+  . History of chicken pox   . History of hiatal hernia   . History of measles, mumps, or rubella   . HOH (hard of hearing)    does not use hearing aides yet  . Hyperlipidemia   . Hypertension   . Irregular heartbeat   . LVH  (left ventricular hypertrophy) 12/24/2017   Echo Aug 2018  . Neuropathy   . Opiate use 11/16/2015  . Peripheral arterial disease ([place]) 05/06/2018   At rest, left > right; refer to vasc  . Pes planus of both feet 11/16/2015  . Plantar fasciitis of right foot 11/16/2015  . Wheezing     Past Surgical History:  Procedure Laterality Date  . ABDOMINAL HYSTERECTOMY    . APPLICATION OF WOUND VAC Left 07/23/2018   Procedure: APPLICATION OF WOUND VAC;  Surgeon: Algernon Huxley, MD;  Location: [facility] ORS;  Service: Vascular;  Laterality: Left;  . banck injections    . CATARACT EXTRACTION W/PHACO Right 05/30/2015   Procedure: CATARACT EXTRACTION PHACO AND INTRAOCULAR LENS PLACEMENT (IOC);  Surgeon: Birder Robson, MD;  Location: [facility] ORS;  Service: Ophthalmology;  Laterality: Right;  [place] 00:57 AP% 20.9 CDE 11.99 fluid pack lot #[id number]  . CHOLECYSTECTOMY  1970  . COLON SURGERY  2013   blocked colon  . COLONOSCOPY WITH PROPOFOL N/A 01/20/2018   Procedure: COLONOSCOPY WITH PROPOFOL;  Surgeon: Lucilla Lame, MD;  [facility] END[facility];  Service: Endoscopy;  Laterality: N/A;  . ENDARTERECTOMY FEMORAL Left 07/08/2018   Procedure: ENDARTERECTOMY FEMORAL;  Surgeon: Algernon Huxley, MD;  Location: [facility] ORS;  Service: Vascular;  Laterality: Left;  . EYE SURGERY Right    cataract surgery  . FOOT FUSION Right 2018   metal in foot  . GALLBLADDER SURGERY  1970  . GASTRIC BYPASS  2010   lost 178 lbs and regained 40 lbs last few years  . HUMERUS FRACTURE SURGERY Right    metal plate with screws  . internal bleeding  2016   ulcer in past  . LOWER EXTREMITY ANGIOGRAM Left 07/08/2018   Procedure: LOWER EXTREMITY ANGIOGRAM;  Surgeon: Algernon Huxley, MD;  Location: [facility] ORS;  Service: Vascular;  Laterality: Left;  . LOWER EXTREMITY ANGIOGRAPHY Left 05/27/2018   Procedure: LOWER EXTREMITY ANGIOGRAPHY;  Surgeon: Algernon Huxley, MD;  Location: [facility];  Service: Cardiovascular;  Laterality: Left;  . LOWER  EXTREMITY ANGIOGRAPHY Right 12/07/2018   Procedure: LOWER EXTREMITY ANGIOGRAPHY;  Surgeon: Algernon Huxley, MD;  Location: [facility];  Service: Cardiovascular;  Laterality: Right;  . LOWER EXTREMITY ANGIOGRAPHY Left 05/10/2019   Procedure: LOWER EXTREMITY ANGIOGRAPHY;  Surgeon: Algernon Huxley, MD;  Location: [facility];  Service: Cardiovascular;  Laterality: Left;  . MOUTH SURGERY     root canals and crowns and extractions  . OVARY SURGERY    . SKIN GRAFT Right 2018   RT foot. foot has been rebuilt.  it is full of metal  . TENOTOMY ACHILLES TENDON Right    Percuntaneous. metal in foot  . TONSILLECTOMY    . WOUND DEBRIDEMENT Left 07/23/2018   Procedure: DEBRIDEMENT WOUND;  Surgeon: Algernon Huxley, MD;  Location: [facility] ORS;  Service: Vascular;  Laterality: Left;     Social History   Tobacco Use  . Smoking status: Current Some Day Smoker    Packs/day: 0.50    Years: 55.00    Pack years: 27.50    Types: Cigarettes    Start date: 07/01/1960    Last attempt to quit: 11/22/2018    Years since quitting: 0.5  . Smokeless tobacco: Never Used  . Tobacco comment: trying to quit again, using chantix  Substance Use Topics  . Alcohol use: Yes    Alcohol/week: 0.0 standard drinks    Comment: 2 drinks per month  . Drug use: Not Currently    Types: Marijuana    Comment: used for pain    Family History  Problem Relation Age of Onset  . Asthma Mother   . COPD Mother   . Arthritis Father   . Depression Father   . Heart disease Father   . Hypertension Father   . Stroke Father   . Heart attack Father   . Arthritis Brother   . Depression Brother   . Diabetes Brother   . Heart disease Brother   . Hyperlipidemia Brother   . Hypertension Brother   . Stroke Brother   . Vision loss Brother   . Heart attack Brother   . Diabetes Maternal Grandmother   . Thyroid disease Daughter   . Colitis Daughter   . Breast cancer Neg Hx     Allergies  Allergen Reactions  . Meloxicam Other  (See Comments)    GI bleeding  . Nsaids Other (See Comments)    Gi bleeding  . Sitagliptin Hives and Itching    Januvia      REVIEW OF SYSTEMS (Negative unless checked)  Constitutional: '[x]' Weight loss  '[]' Fever  '[]' Chills Cardiac: '[]' Chest pain   '[]' Chest pressure   '[x]' Palpitations   '[]' Shortness of breath when laying flat   '[]' Shortness of breath at rest   '[x]' Shortness of breath with exertion. Vascular:  '[x]' Pain in legs with walking   '[x]' Pain in legs at rest   '[]' Pain in legs when laying flat   '[]' Claudication   '[]' Pain in feet when walking  '[]' Pain in feet at rest  '[]' Pain in feet when laying flat   '[]' History of DVT   '[]' Phlebitis   '[]' Swelling in legs   '[]' Varicose veins   '[]' Non-healing ulcers Pulmonary:   '[]' Uses home oxygen   '[]' Productive cough   '[]' Hemoptysis   '[]' Wheeze  '[]' COPD   '[]' Asthma Neurologic:  '[]' Dizziness  '[]' Blackouts   '[]' Seizures   '[]' History of stroke   '[]' History of TIA  '[]' Aphasia   '[]' Temporary blindness   '[]' Dysphagia   '[]' Weakness or numbness in arms   '[]' Weakness or numbness in legs Musculoskeletal:  '[x]' Arthritis   '[]' Joint swelling   '[x]' Joint pain   '[]' Low back pain Hematologic:  '[]' Easy bruising  '[]' Easy bleeding   '[]' Hypercoagulable state   '[]' Anemic   Gastrointestinal:  '[]' Blood in stool   '[]' Vomiting blood  '[]' Gastroesophageal reflux/heartburn   '[]' Abdominal pain Genitourinary:  '[x]' Chronic kidney disease   '[]' Difficult urination  '[]' Frequent urination  '[]' Burning with urination   '[]' Hematuria Skin:  '[]' Rashes   '[]' Ulcers   '[]' Wounds Psychological:  '[]' History of anxiety   '[]'  History of major depression.  Physical Examination  BP (!) 184/65 (BP Location: Right Arm)   Pulse 67   Resp 16   Wt 197 lb (89.4 kg)   BMI 37.22 kg/m  Gen:  WD/WN, NAD.  Has lost weight and looks more lively and active today. Head: [place]/AT, No temporalis wasting. Ear/Nose/Throat: Hearing grossly intact, nares w/o erythema or drainage Eyes: Conjunctiva clear. Sclera non-icteric Neck: Supple.  Trachea midline Pulmonary:  Good  air movement, no use of accessory muscles.  Cardiac: RRR, no JVD Vascular:  Vessel Right Left  Radial Palpable Palpable                          PT Palpable Palpable  DP Palpable Palpable    Musculoskeletal: M/S 5/5 throughout.  No deformity or atrophy.  No significant lower extremity edema. Neurologic: Sensation grossly intact in extremities.  Symmetrical.  Speech is fluent.  Psychiatric: Judgment intact, Mood & affect appropriate for pt's clinical situation. Dermatologic: No rashes or ulcers noted.  No cellulitis or open wounds.       Labs Recent Results (from the past 2160 hour(s))  SARS CORONAVIRUS 2 (TAT 6-24 HRS) Nasopharyngeal Nasopharyngeal Swab     Status: None   Collection Time: 05/06/19  8:28 AM   Specimen: Nasopharyngeal Swab  Result Value Ref Range   SARS Coronavirus 2 NEGATIVE NEGATIVE    Comment: (NOTE) SARS-CoV-2 target nucleic acids are NOT DETECTED. The SARS-CoV-2 RNA is generally detectable in upper and lower respiratory specimens during the acute phase of infection. Negative results do not preclude SARS-CoV-2 infection, do not rule out co-infections with other pathogens, and should not be used as the sole basis for treatment or other patient management decisions. Negative results must be combined with clinical observations, patient history, and epidemiological information. The expected result is Negative. Fact Sheet for Patients: SugarRoll.be Fact Sheet for Healthcare Providers: https://www.woods-mathews.com/ This test is not yet approved or cleared by the [place] FDA and  has been authorized for detection and/or diagnosis of SARS-CoV-2 by FDA under an Emergency Use Authorization (EUA). This EUA will remain  in effect (meaning this test can be used) for the duration of the COVID-19 declaration under Section 56 4(b)(1) of the Act, 21 U.S.C. section 360bbb-3(b)(1), unless the authorization is terminated  or revoked sooner. Performed at [facility], [place] [address] [place], [place] [address]   Glucose, capillary     Status: Abnormal   Collection Time: 05/10/19  7:23 AM  Result Value Ref Range   Glucose-Capillary 140 (H) 70 - 99 mg/dL  BUN     Status: None   Collection Time: 05/10/19  7:26 AM  Result Value Ref Range   BUN 23 8 - 23 mg/dL    Comment: Performed at [facility], [address] [place], [place] [address]  Creatinine, serum     Status: None   Collection Time: 05/10/19  7:26 AM  Result Value Ref Range   Creatinine, Ser 0.78 0.44 - 1.00 mg/dL   GFR calc non Af Amer >60 >60 mL/min   GFR calc Af Amer >60 >60 mL/min    Comment: Performed at [facility], [place], [place] [address]  COMPLETE METABOLIC PANEL WITH GFR     Status: Abnormal   Collection Time: 06/03/19 12:00 AM  Result Value Ref Range   Glucose, Bld 116 (H) 65 - 99 mg/dL    Comment: .  Fasting reference interval . For someone without known diabetes, a glucose value between 100 and 125 mg/dL is consistent with prediabetes and should be confirmed with a follow-up test. .    BUN 16 7 - 25 mg/dL   Creat 0.78 0.60 - 0.93 mg/dL    Comment: For patients >41 years of age, the reference limit for Creatinine is approximately 13% higher for people identified as African-American. .    GFR, Est Non African American 76 > OR = 60 mL/min/1.81m   GFR, Est African American 88 > OR = 60 mL/min/1.767m  BUN/Creatinine Ratio NOT APPLICABLE 6 - 22 (calc)   Sodium 135 135 - 146 mmol/L   Potassium 4.9 3.5 - 5.3 mmol/L   Chloride 101 98 - 110 mmol/L   CO2 27 20 - 32 mmol/L   Calcium 9.8 8.6 - 10.4 mg/dL   Total Protein 6.6 6.1 - 8.1 g/dL   Albumin 4.3 3.6 - 5.1 g/dL   Globulin 2.3 1.9 - 3.7 g/dL (calc)   AG Ratio 1.9 1.0 - 2.5 (calc)   Total Bilirubin 0.5 0.2 - 1.2 mg/dL   Alkaline phosphatase (APISO) 66 37 - 153 U/L   AST 15 10 - 35 U/L   ALT 11 6 - 29 U/L  CBC  with Differential/Platelet     Status: None   Collection Time: 06/03/19 12:00 AM  Result Value Ref Range   WBC 7.6 3.8 - 10.8 Thousand/uL   RBC 4.42 3.80 - 5.10 Million/uL   Hemoglobin 12.3 11.7 - 15.5 g/dL   HCT 37.3 35.0 - 45.0 %   MCV 84.4 80.0 - 100.0 fL   MCH 27.8 27.0 - 33.0 pg   MCHC 33.0 32.0 - 36.0 g/dL   RDW 13.8 11.0 - 15.0 %   Platelets 169 140 - 400 Thousand/uL   MPV 9.6 7.5 - 12.5 fL   Neutro Abs 5,761 1,500 - 7,800 cells/uL   Lymphs Abs 1,186 850 - 3,900 cells/uL   Absolute Monocytes 456 200 - 950 cells/uL   Eosinophils Absolute 137 15 - 500 cells/uL   Basophils Absolute 61 0 - 200 cells/uL   Neutrophils Relative % 75.8 %   Total Lymphocyte 15.6 %   Monocytes Relative 6.0 %   Eosinophils Relative 1.8 %   Basophils Relative 0.8 %  Lipid panel     Status: Abnormal   Collection Time: 06/03/19 12:00 AM  Result Value Ref Range   Cholesterol 129 <200 mg/dL   HDL 43 (L) > OR = 50 mg/dL   Triglycerides 109 <150 mg/dL   LDL Cholesterol (Calc) 67 mg/dL (calc)    Comment: Reference range: <100 . Desirable range <100 mg/dL for primary prevention;   <70 mg/dL for patients with CHD or diabetic patients  with > or = 2 CHD risk factors. . Marland KitchenDL-C is now calculated using the Martin-Hopkins  calculation, which is a validated novel method providing  better accuracy than the Friedewald equation in the  estimation of LDL-C.  MaCresenciano Genret al. JAAnnamaria [phone]  (http://education.[employer].com/faq/FAQ164)    Total CHOL/HDL Ratio 3.0 <5.0 (calc)   Non-HDL Cholesterol (Calc) 86 <130 mg/dL (calc)    Comment: For patients with diabetes plus 1 major ASCVD risk  factor, treating to a non-HDL-C goal of <100 mg/dL  (LDL-C of <70 mg/dL) is considered a therapeutic  option.   Hemoglobin A1c     Status: Abnormal   Collection Time: 06/03/19 12:00 AM  Result Value Ref Range   Hgb  A1c MFr Bld 6.6 (H) <5.7 % of total Hgb    Comment: For someone without known diabetes,  a hemoglobin A1c value of 6.5% or greater indicates that they may have  diabetes and this should be confirmed with a follow-up  test. . For someone with known diabetes, a value <7% indicates  that their diabetes is well controlled and a value  greater than or equal to 7% indicates suboptimal  control. A1c targets should be individualized based on  duration of diabetes, age, comorbid conditions, and  other considerations. . Currently, no consensus exists regarding use of hemoglobin A1c for diagnosis of diabetes for children. .    Mean Plasma Glucose 143 (calc)   eAG (mmol/L) 7.9 (calc)    Radiology No results found.  Assessment/Plan  Hypertension with heart disease blood pressure control important in reducing the progression of atherosclerotic disease. On appropriate oral medications.   Diabetic neuropathy (HCC) blood glucose control important in reducing the progression of atherosclerotic disease. Also, involved in wound healing. On appropriate medications.   Coronary artery disease of native artery of native heart with stable angina pectoris (HCC) Continue cardiac and antihypertensive medications as already ordered and reviewed, no changes at this time. Continue statin as ordered and reviewed, no changes at this time Nitrates PRN for chest pain   Tobacco use disorder We had a discussion for approximately 3-4 minutes regarding the absolute need for smoking cessation due to the deleterious nature of tobacco on the vascular system. We discussed the tobacco use would diminish patency of any intervention, and likely significantly worsen progressio of disease. We discussed multiple agents for quitting including replacement therapy or medications to reduce cravings such as Chantix.  She has tried Chantix recently without success.  She has previously taken a class at the hospital as well as done Chantix on time she has quit in the past.  The patient voices their understanding of the  importance of smoking cessation.  She understands if she continues smoking, all the work we have done to improve her lower extremity perfusion is much more likely to fail.   Atherosclerosis of artery of extremity with rest pain ([place]) Her noninvasive studies today show marked improvement in her ABIs at 1.05 on the right and 1.04 on the left with strong digital waveforms bilaterally.  Her symptoms are much improved.  We again discussed the importance of smoking cessation.  She will continue her current medical regimen.  I will plan to see her back in 3 to 4 months with noninvasive studies    Leotis Pain, MD  06/15/2019 12:24 PM    This note was created with Dragon medical transcription system.  Any errors from dictation are purely unintentional

## 2019-06-15 NOTE — Assessment & Plan Note (Signed)
blood glucose control important in reducing the progression of atherosclerotic disease. Also, involved in wound healing. On appropriate medications.  

## 2019-06-16 ENCOUNTER — Ambulatory Visit: Payer: Medicare Other | Admitting: Physical Therapy

## 2019-06-16 ENCOUNTER — Other Ambulatory Visit: Payer: Self-pay | Admitting: Family Medicine

## 2019-06-16 DIAGNOSIS — J3089 Other allergic rhinitis: Secondary | ICD-10-CM

## 2019-06-16 DIAGNOSIS — J45909 Unspecified asthma, uncomplicated: Secondary | ICD-10-CM

## 2019-06-21 ENCOUNTER — Ambulatory Visit: Payer: Medicare Other | Admitting: Physical Therapy

## 2019-06-21 ENCOUNTER — Encounter: Payer: Self-pay | Admitting: Physical Therapy

## 2019-06-21 ENCOUNTER — Telehealth: Payer: Self-pay | Admitting: Family Medicine

## 2019-06-21 ENCOUNTER — Other Ambulatory Visit: Payer: Self-pay

## 2019-06-21 DIAGNOSIS — R262 Difficulty in walking, not elsewhere classified: Secondary | ICD-10-CM

## 2019-06-21 DIAGNOSIS — M6281 Muscle weakness (generalized): Secondary | ICD-10-CM

## 2019-06-21 DIAGNOSIS — M545 Low back pain, unspecified: Secondary | ICD-10-CM

## 2019-06-21 DIAGNOSIS — G8929 Other chronic pain: Secondary | ICD-10-CM

## 2019-06-21 DIAGNOSIS — Z9181 History of falling: Secondary | ICD-10-CM

## 2019-06-21 NOTE — Telephone Encounter (Signed)
Pt is asking if you would order her an antibody test to see if she has had covid. Her family is in quarantine and she think she may have had it sometime last month.

## 2019-06-21 NOTE — Telephone Encounter (Signed)
Pt is calling for her latest blood work from [facility] however the labs are from another md. Please confirm

## 2019-06-21 NOTE — Therapy (Signed)
[facility] [address], [place], [address] Phone: [phone]   Fax:  [phone]  Physical Therapy Treatment  Patient Details  Name: MONTIA HASLIP MRN: [id number] Date of Birth: 1946-05-16 Referring Provider (PT): Fredderick Severance, NP    Encounter Date: 06/21/2019  PT End of Session - 06/21/19 1422    Visit Number  46    Number of Visits  65    Date for PT Re-Evaluation  08/23/19    Authorization Type  [facility] Medicare reporting period from 05/31/2019    Authorization - Visit Number  6    Authorization - Number of Visits  10    PT Start Time  8938    PT Stop Time  1115    PT Time Calculation (min)  40 min    Equipment Utilized During Treatment  Gait belt    Activity Tolerance  Patient limited by pain;Patient tolerated treatment well;Patient limited by fatigue    Behavior During Therapy  [facility] for tasks assessed/performed       Past Medical History:  Diagnosis Date  . Allergy    seasonal allergies  . Anemia 2014   needed 5 units of blood d/t passing out, weak  . Anxiety   . Aortic stenosis 12/24/2017   Echo Aug 2018  . Arthritis   . Asthma    allergy induced asthma  . Cardiac murmur 12/12/2017  . Cataract   . Cataract    left  . Complication of anesthesia    arrhythmia following colonoscopy  . Degenerative disc disease, lumbar   . Degenerative disc disease, lumbar   . Depression   . Diabetes mellitus   . Diabetic neuropathy ([place]) 11/16/2015  . Dysrhythmia    stenosis of left ventricle  . GERD (gastroesophageal reflux disease)   . H/O transfusion    patient was given 5 units of blood while at [place], blood type O+  . History of chicken pox   . History of hiatal hernia   . History of measles, mumps, or rubella   . HOH (hard of hearing)    does not use hearing aides yet  . Hyperlipidemia   . Hypertension   . Irregular heartbeat   . LVH (left ventricular hypertrophy) 12/24/2017    Echo Aug 2018  . Neuropathy   . Opiate use 11/16/2015  . Peripheral arterial disease ([place]) 05/06/2018   At rest, left > right; refer to vasc  . Pes planus of both feet 11/16/2015  . Plantar fasciitis of right foot 11/16/2015  . Wheezing     Past Surgical History:  Procedure Laterality Date  . ABDOMINAL HYSTERECTOMY    . APPLICATION OF WOUND VAC Left 07/23/2018   Procedure: APPLICATION OF WOUND VAC;  Surgeon: Algernon Huxley, MD;  Location: [facility] ORS;  Service: Vascular;  Laterality: Left;  . banck injections    . CATARACT EXTRACTION W/PHACO Right 05/30/2015   Procedure: CATARACT EXTRACTION PHACO AND INTRAOCULAR LENS PLACEMENT (IOC);  Surgeon: Birder Robson, MD;  Location: [facility] ORS;  Service: Ophthalmology;  Laterality: Right;  [place] 00:57 AP% 20.9 CDE 11.99 fluid pack lot #[id number]  . CHOLECYSTECTOMY  1970  . COLON SURGERY  2013   blocked colon  . COLONOSCOPY WITH PROPOFOL N/A 01/20/2018   Procedure: COLONOSCOPY WITH PROPOFOL;  Surgeon: Lucilla Lame, MD;  Location: [facility] ENDOSCOPY;  Service: Endoscopy;  Laterality: N/A;  . ENDARTERECTOMY FEMORAL Left 07/08/2018   Procedure: ENDARTERECTOMY FEMORAL;  Surgeon:  Algernon Huxley, MD;  Location: [facility] ORS;  Service: Vascular;  Laterality: Left;  . EYE SURGERY Right    cataract surgery  . FOOT FUSION Right 2018   metal in foot  . GALLBLADDER SURGERY  1970  . GASTRIC BYPASS  2010   lost 178 lbs and regained 40 lbs last few years  . HUMERUS FRACTURE SURGERY Right    metal plate with screws  . internal bleeding  2016   ulcer in past  . LOWER EXTREMITY ANGIOGRAM Left 07/08/2018   Procedure: LOWER EXTREMITY ANGIOGRAM;  Surgeon: Algernon Huxley, MD;  Location: [facility] ORS;  Service: Vascular;  Laterality: Left;  . LOWER EXTREMITY ANGIOGRAPHY Left 05/27/2018   Procedure: LOWER EXTREMITY ANGIOGRAPHY;  Surgeon: Algernon Huxley, MD;  Location: [place] [facility];  Service: Cardiovascular;  Laterality: Left;  . LOWER EXTREMITY ANGIOGRAPHY Right 12/07/2018   Procedure:  LOWER EXTREMITY ANGIOGRAPHY;  Surgeon: Algernon Huxley, MD;  Location: [place] [facility];  Service: Cardiovascular;  Laterality: Right;  . LOWER EXTREMITY ANGIOGRAPHY Left 05/10/2019   Procedure: LOWER EXTREMITY ANGIOGRAPHY;  Surgeon: Algernon Huxley, MD;  Location: [place] [facility];  Service: Cardiovascular;  Laterality: Left;  . MOUTH SURGERY     root canals and crowns and extractions  . OVARY SURGERY    . SKIN GRAFT Right 2018   RT foot. foot has been rebuilt.  it is full of metal  . TENOTOMY ACHILLES TENDON Right    Percuntaneous. metal in foot  . TONSILLECTOMY    . WOUND DEBRIDEMENT Left 07/23/2018   Procedure: DEBRIDEMENT WOUND;  Surgeon: Algernon Huxley, MD;  Location: [facility] ORS;  Service: Vascular;  Laterality: Left;    There were no vitals filed for this visit.  Subjective Assessment - 06/21/19 1420    Subjective  Patient reports back and R knee pain near 5/10 upon arrival. States she felt okay following last treatment session but her R knee started hurting more the next day. Reports she has been standing and walking a lot and making a lot of cookies, although she has been limited by her condition in that she did not go out to the shed to get her cookie press this year.    Pertinent History  Patient is a 73 y.o. female who presents to outpatient physical therapy with a referral for medical diagnosis of degeneration of lumbar intervertebral disc. This patient's chief complaints consist of chronic low back pain and foot pain, weakness, stiffness, leading to the following functional deficits: difficulty with with basic ADLs, IADLs, ambulation. She used to get injections to help control back pain but it has recently worsened due to being unable to get injections during [place] pandemic. The patient has been informed of current processes in place at Outpatient Rehab to protect patients from Covid-19 exposure including social distancing, schedule modifications, and new cleaning procedures. After  discussing their particular risk with a therapist based on the patient's personal risk factors, the patient has decided to proceed with in-person therapy. Pt is scheduled for R LE angiography on 12/07/2018. She has recently started smoking again with the stress of COVID19 pandemic and feels strongly she needs to stop. Surgeon has stated in the past he will not complete surgery if she is smoking.  Relevant past medical history and comorbidities include underwent extensive vascular surgeries in Jan 2020 to improve blood flow, similar surgery for R leg scheduled 12/07/2018. Has a history of left carotid artery stenosis, but not enough to have  surgery, has a history of multiple arterial surgeries, history of R cataract surgery, colon surgery, cholecystectomy, R foot skin graft, ovary surgery, gastric bypass, R foot fusion, abdominal hysterectomy; current smoker, plantar fasciitis, peripheral arterial disease, LVH, diabetes mellitus with neuropathy, irregular heartbeat, hiatal hernia, GERD, stenosis of left heart ventricle, depression, lumbar degenerative disc disease, cardiac murmur, asthma, anxiety, obesity, HTN, and seasonal allergies She has been advised not to lift over 10 pounds by physician    Limitations  Sitting;Standing;Walking;House hold activities;Lifting    How long can you sit comfortably?  > 1 hour in almost any chair    How long can you stand comfortably?  10-15 min while cooking    How long can you walk comfortably?  2 times to the mailbox and back (120 feet to mailbox) with single point cane. Can ambulate 1 hour with rollator with seated resting breaks.    Diagnostic tests  MRI lumbar spine report 06/14/2017: "IMPRESSION: 1. Stable degenerative of lumbar spondylosis and scoliosis with multilevel disc disease and facet disease. 2. Stable right foraminal stenosis at L5-S1 due to right-sided disc    Patient Stated Goals  patient would like to get back to PLOF, walking further, be better able to  participate in dog showing and trips    Currently in Pain?  Yes    Pain Score  5     Pain Onset  More than a month ago        TREATMENT  Patient goal until next session: 2 laps walking around outside of home each day.  Therapeutic exercise:to centralize symptoms and improve ROM, strength, muscular endurance, and activity tolerance required for successful completion of functional activities.  Large NuSteplevel1-2 usingBLEs.Seatsetting 8. For improved extremity mobility, muscular endurance, and activity tolerance; and to induce the analgesic effect of aerobic exercise, stimulate improved joint nutrition, and prepare body structures and systems for following interventions.during subjective exam. Average SPM = 67. x10 min during subjective exam.   Sit <> stand from 22inchseatwith no UE support, cuing for glute contraction.2x15. R knee painful.   Mat Exercises:   sidelying hip abduction x20 each side manual support to maintain proper form.   Supine bridges x15, x5  Supine abdominal brace with active stragiht leg raise. x15, x5  (each side)   Seated exercises with trunk unsupported sitting on dynadisc to improve core strength:  - B shoulder flexion, x12 (3#) - pallof press, red theraband loop, x15 each side  Cuing for improved effort, purpose of exercise, translation to personal functional goals. Education about HEP.   HOME EXERCISE PROGRAM Access Code: [email or web address]  URL: [email or web address]  Date: 05/18/2019  Prepared by: Rosita Kea   Exercises  Walking - 1.5 to the mailbox and back - 1.5 2 minutes in the house - 1x daily - 7x weekly  Leg stretch - 10 reps - 3 seconds hold - 2x daily - 7x weekly  Seated Shoulder Flexion with Dumbbells - 2 sets - 5 reps - 1 second hold - 1x daily - 7x weekly  Seated Shoulder Abduction with Dumbbells - Thumbs Up - 2 sets - 5 reps - 1 second hold - 1x daily - 7x weekly  Seated Shoulder Horizontal Abduction with  Resistance - 2 sets - 10 reps - 1 second hold - 1x daily - 7x weekly    PT Education - 06/21/19 1422    Education provided  Yes    Education Details  Exercise purpose/form. Self management techniques.    Person(s) Educated  Patient  Methods  Explanation;Demonstration;Tactile cues;Verbal cues    Comprehension  Verbalized understanding;Returned demonstration;Verbal cues required;Need further instruction;Tactile cues required       PT Short Term Goals - 05/31/19 2104      PT SHORT TERM GOAL #1   Title  Be independent with initial home exercise program for self-management of symptoms.    Baseline  Initial HEP provided at [facility] (11/17/2018); was completing until vascular procedure, re-instated 12/15/2018 (12/15/2018); currently participating (01/26/2019); Partially doing HEP due the patient's recent health condition/weakness(03/23/2019); Patient has been doing her HEP every other day. 04/20/2019; has returned to completing her walking HEP as prescribed (05/31/2019);    Time  2    Period  Weeks    Status  Achieved    Target Date  05/04/19        PT Long Term Goals - 05/31/19 2104      PT LONG TERM GOAL #1   Title  Be independent with a long-term home exercise program for self-management of symptoms.     Baseline  Initial HEP provided at [facility] (11/17/2018); was doing better before vascular surgery, is working at returning to [facility] since procedure (12/15/2018); currently participating but not assigned final long term HEP yet (01/26/2019); currently participating but not assigned final long term HEP yet (03/23/2019); HEP progressing (04/20/2019; 05/31/2019);    Time  12    Period  Weeks    Status  Partially Met    Target Date  08/23/19      PT LONG TERM GOAL #2   Title  Patient will improve 6MWT to equal or greater than 1000 feet with LRAD to demonstrate improved activity tolerance for community ambulation.     Baseline  6 Minute Walk Test: 200 feet with SPC and supervision. Stopped at 1:43 due to  fatigue and leg pain. (11/17/2018); 457 feet SPC, on seated and one standing break (12/15/2018); 547 with SPC, 2 seated and one standing rest break (01/26/2019); 468 feet with SPC, 2 seated breaks(03/23/2019); 638 feet with SPC, 2 seated breaks; 747 feet with SPC and 2 standing rest breaks (05/31/2019);    Time  12    Period  Weeks    Status  Partially Met    Target Date  08/23/19      PT LONG TERM GOAL #3   Title  Patient will demonstrate improved 5 Times Sit to Stand test to 11 seconds from chair height with no UE support to demonstrate improvement in LE power and functional strength for daily activities.    Baseline  5 Time Sit to Stand: 27 seconds from chair height no UE support (11/17/2018); 18 seconds, no UE support from chair-high plinth (12/15/2018); 16 seconds with no UE support from chair (01/26/2019); 17.45s with lack of knee extension in standing(03/23/2019); 14.03s without BUEs help (04/20/2019); 16 seconds without BUE help from 18.5 inch seat (05/31/2019);    Time  12    Period  Weeks    Status  Partially Met    Target Date  08/23/19      PT LONG TERM GOAL #4   Title  Patient will ambulate faster than 1.15ms on the 10 Meter Walk Test to improve ability to cross street safey for community participation(05/21/2019); 244m with SPC (05/31/2019);    Baseline  testing deferred to next session (11/17/2018); 1.37 m/sec with SPC (11/24/2018); 1.3 m/sec with SPC (12/15/2018; 01/26/2019); 0.4765m(03/23/2019); 0.98 m/sec with SPC (04/20/2019); 1 m/sec (05/31/2019);    Time  12    Period  Weeks    Status  Partially Met    Target Date  08/23/19      PT LONG TERM GOAL #5   Title  Patient will score equal or greater than  25/30 on the Functional Gait Assessment to demonstrate low fall risk to improve her ability to participate safely in community activities.     Baseline  testing deferred to next session (11/17/2018); 11/30 high fall risk (11/24/2018); unable to measure due to time limitation (12/15/2018);  13/30 high fall risk (12/17/2018); 14/30 high fall risk (01/26/2019); 14/30 high fall risk(03/23/2019); 17/30 high fall risk (04/20/2019); 21/30 moderate fall risk (05/31/2019);    Time  12    Period  Weeks    Status  Partially Met    Target Date  08/23/19      PT LONG TERM GOAL #6   Title  Reduce pain to equal or less than 4/10 with functional activities to allow patient to complete valued functional tasks such as caring for dogs, grocery shopping, walking with less difficulty.(03/23/2019);    Baseline  8/10 (01/26/2019); 6/10 (03/23/2019); 4/10 (04/20/2019); 4/10 (05/31/2019);    Time  12    Period  Weeks    Status  Partially Met    Target Date  08/23/19            Plan - 06/21/19 1423    Clinical Impression Statement  Patient tolerated treatment well today with mild difficulty at times due to R knee pain and L hip pain. Required physical assistance for bed mobility and for cuing for leg exercises. Focused on non-weight bearing exercises today due to frequent standing lately and increased knee pain. Patient would benefit from continued physical therapy to address remaining impairments and functional limitations to work towards stated goals and return to PLOF or maximal functional independence.    Personal Factors and Comorbidities  Age;Comorbidity 3+;Fitness;Time since onset of injury/illness/exacerbation;Past/Current Experience;Other    Comorbidities  underwent extensive vascular surgeries in Jan 2020 to improve blood flow, similar surgery for R leg scheduled 12/07/2018. Has a history of left carotid artery stenosis, but not enough to have surgery, has a history of multiple arterial surgeries, history of R cataract surgery, colon surgery, cholecystectomy, R foot skin graft, ovary surgery, gastric bypass, R foot fusion, abdominal hysterectomy; current smoker, plantar fasciitis, peripheral arterial disease, LVH, diabetes mellitus with neuropathy, irregular heartbeat, hiatal hernia, GERD, stenosis  of left heart ventricle, depression, lumbar degenerative disc disease, cardiac murmur, asthma, anxiety, obesity, HTN, and seasonal allergies She has been advised not to lift over 10 pounds by physician.      Examination-Activity Limitations  Bathing;Carry;Lift;Sit;Stand;Locomotion Level;Toileting;Dressing;Squat;Transfers;Stairs;Hygiene/Grooming;Other    Examination-Participation Restrictions  Community Activity;Volunteer;Cleaning;Interpersonal Relationship;Yard Work;Other    Rehab Potential  Fair    Clinical Impairments Affecting Rehab Potential  (+) motivated (-) muliple co morbidities, chronic condition    PT Frequency  2x / week    PT Duration  12 weeks    PT Treatment/Interventions  Moist Heat;Patient/family education;Neuromuscular re-education;Therapeutic exercise;Manual techniques;ADLs/Self Care Home Management;Electrical Stimulation;Gait training;Stair training;Functional mobility training;Therapeutic activities;Balance training;Passive range of motion;Dry needling;Energy conservation;Spinal Manipulations;Joint Manipulations;Cryotherapy    PT Next Visit Plan  strengthening, pain control, and promotion of self-management techniques    PT Home Exercise Plan  Medbridge Access Code: [email or web address]    Consulted and Agree with Plan of Care  Patient       Patient will benefit from skilled therapeutic intervention in order to improve the following deficits and impairments:  Decreased strength, Impaired flexibility, Decreased activity  tolerance, Impaired perceived functional ability, Pain, Decreased endurance, Difficulty walking, Abnormal gait, Decreased knowledge of use of DME, Decreased skin integrity, Decreased range of motion, Impaired sensation, Improper body mechanics, Obesity, Postural dysfunction, Increased edema, Decreased mobility, Decreased balance, Cardiopulmonary status limiting activity, Decreased coordination  Visit Diagnosis: Muscle weakness (generalized)  Chronic bilateral low back pain,  unspecified whether sciatica present  Difficulty in walking, not elsewhere classified  History of falling     Problem List Patient Active Problem List   Diagnosis Date Noted  . Tobacco use disorder 06/15/2019  . Osteopenia 03/03/2019  . Lower extremity edema 07/31/2018  . Pressure injury of skin 07/24/2018  . Surgical site infection 07/23/2018  . Wound of left leg 07/23/2018  . Atherosclerosis of artery of extremity with rest pain ([place]) 07/08/2018  . Shortness of breath 06/02/2018  . Bruit 06/02/2018  . Coronary artery disease of native artery of native heart with stable angina pectoris ([place]) 06/02/2018  . Atherosclerosis of native arteries of extremity with intermittent claudication ([place]) 05/19/2018  . Peripheral arterial disease ([place]) 05/06/2018  . Personal history of colonic polyps   . Aortic stenosis 12/24/2017  . LVH (left ventricular hypertrophy) 12/24/2017  . Hypertension with heart disease 12/24/2017  . Left atrial dilatation 12/12/2017  . Cardiac murmur 12/12/2017  . Gastrojejunal ulcer 12/04/2017  . Hyperphosphatemia 12/04/2017  . Spinal stenosis of lumbar region 06/18/2017  . Degeneration of lumbar intervertebral disc 06/02/2017  . Assistance needed with transportation 05/26/2017  . Financial difficulties 05/26/2017  . Needs assistance with community resources 05/26/2017  . Moderate recurrent major depression ([place]) 05/26/2017  . Non-healing wound of lower extremity 02/06/2017  . Status post ankle arthrodesis 12/11/2016  . Controlled substance agreement broken 10/11/2016  . Low back pain 09/25/2016  . Osteoarthritis of right subtalar joint 07/11/2016  . Posterior tibial tendinitis of right lower extremity 07/11/2016  . Breast cancer screening 06/18/2016  . Knee pain 04/03/2016  . Hyponatremia 03/01/2016  . High triglycerides 12/24/2015  . Pes planus of both feet 11/16/2015  . Plantar fasciitis of right foot 11/16/2015  . Heel spur 11/16/2015  . Diabetic  neuropathy ([place]) 11/16/2015  . Right ankle pain 11/16/2015  . Medication monitoring encounter 11/16/2015  . Chronic pain of multiple joints 08/15/2015  . Abnormal mammogram of right breast 06/19/2015  . Cerumen impaction 03/16/2015  . Hyperlipidemia 12/14/2014  . History of transfusion of packed RBC 12/14/2014  . Hx of smoking 12/14/2014  . Status post bariatric surgery 12/14/2014  . Morbid obesity ([place]) 12/14/2014  . Chronic radicular lumbar pain 12/14/2014  . History of GI bleed 11/12/2014  . History of small bowel obstruction 09/07/2014    Everlean Alstrom. [name], PT, DPT 06/21/19, 2:24 PM  [facility] [address] [place], [address] Phone: [phone]   Fax:  [phone]  Name: DANAJA LASOTA MRN: [id number] Date of Birth: 1946/05/26

## 2019-06-21 NOTE — Telephone Encounter (Signed)
Patient notified

## 2019-06-23 ENCOUNTER — Ambulatory Visit: Payer: Medicare Other | Admitting: Physical Therapy

## 2019-06-23 ENCOUNTER — Other Ambulatory Visit: Payer: Self-pay

## 2019-06-23 ENCOUNTER — Encounter: Payer: Self-pay | Admitting: Physical Therapy

## 2019-06-23 DIAGNOSIS — G8929 Other chronic pain: Secondary | ICD-10-CM

## 2019-06-23 DIAGNOSIS — R262 Difficulty in walking, not elsewhere classified: Secondary | ICD-10-CM

## 2019-06-23 DIAGNOSIS — Z9181 History of falling: Secondary | ICD-10-CM

## 2019-06-23 DIAGNOSIS — M6281 Muscle weakness (generalized): Secondary | ICD-10-CM

## 2019-06-23 NOTE — Therapy (Signed)
[facility] [address] [place], [address] Phone: [phone]   Fax:  [phone]  Physical Therapy Treatment  Patient Details  Name: Andrea Holden MRN: [id number] Date of Birth: Jul 10, 1945 Referring Provider (PT): Fredderick Severance, NP    Encounter Date: 06/23/2019  PT End of Session - 06/23/19 1334    Visit Number  47    Number of Visits  65    Date for PT Re-Evaluation  08/23/19    Authorization Type  [facility] Medicare reporting period from 05/31/2019    Authorization - Visit Number  7    Authorization - Number of Visits  10    PT Start Time  8182    PT Stop Time  1345    PT Time Calculation (min)  38 min    Equipment Utilized During Treatment  Gait belt    Activity Tolerance  Patient limited by pain;Patient tolerated treatment well;Patient limited by fatigue    Behavior During Therapy  [facility] for tasks assessed/performed       Past Medical History:  Diagnosis Date  . Allergy    seasonal allergies  . Anemia 2014   needed 5 units of blood d/t passing out, weak  . Anxiety   . Aortic stenosis 12/24/2017   Echo Aug 2018  . Arthritis   . Asthma    allergy induced asthma  . Cardiac murmur 12/12/2017  . Cataract   . Cataract    left  . Complication of anesthesia    arrhythmia following colonoscopy  . Degenerative disc disease, lumbar   . Degenerative disc disease, lumbar   . Depression   . Diabetes mellitus   . Diabetic neuropathy ([place]) 11/16/2015  . Dysrhythmia    stenosis of left ventricle  . GERD (gastroesophageal reflux disease)   . H/O transfusion    patient was given 5 units of blood while at [facility], blood type O+  . History of chicken pox   . History of hiatal hernia   . History of measles, mumps, or rubella   . HOH (hard of hearing)    does not use hearing aides yet  . Hyperlipidemia   . Hypertension   . Irregular heartbeat   . LVH (left ventricular hypertrophy) 12/24/2017    Echo Aug 2018  . Neuropathy   . Opiate use 11/16/2015  . Peripheral arterial disease ([place]) 05/06/2018   At rest, left > right; refer to vasc  . Pes planus of both feet 11/16/2015  . Plantar fasciitis of right foot 11/16/2015  . Wheezing     Past Surgical History:  Procedure Laterality Date  . ABDOMINAL HYSTERECTOMY    . APPLICATION OF WOUND VAC Left 07/23/2018   Procedure: APPLICATION OF WOUND VAC;  Surgeon: Algernon Huxley, MD;  Location: [facility] ORS;  Service: Vascular;  Laterality: Left;  . banck injections    . CATARACT EXTRACTION W/PHACO Right 05/30/2015   Procedure: CATARACT EXTRACTION PHACO AND INTRAOCULAR LENS PLACEMENT (IOC);  Surgeon: Birder Robson, MD;  Location: [facility] ORS;  Service: Ophthalmology;  Laterality: Right;  [place] 00:57 AP% 20.9 CDE 11.99 fluid pack lot #[id number]  . CHOLECYSTECTOMY  1970  . COLON SURGERY  2013   blocked colon  . COLONOSCOPY WITH PROPOFOL N/A 01/20/2018   Procedure: COLONOSCOPY WITH PROPOFOL;  Surgeon: Lucilla Lame, MD;  Location: [facility] ENDOSCOPY;  Service: Endoscopy;  Laterality: N/A;  . ENDARTERECTOMY FEMORAL Left 07/08/2018   Procedure: ENDARTERECTOMY FEMORAL;  Surgeon:  Algernon Huxley, MD;  Location: [facility] ORS;  Service: Vascular;  Laterality: Left;  . EYE SURGERY Right    cataract surgery  . FOOT FUSION Right 2018   metal in foot  . GALLBLADDER SURGERY  1970  . GASTRIC BYPASS  2010   lost 178 lbs and regained 40 lbs last few years  . HUMERUS FRACTURE SURGERY Right    metal plate with screws  . internal bleeding  2016   ulcer in past  . LOWER EXTREMITY ANGIOGRAM Left 07/08/2018   Procedure: LOWER EXTREMITY ANGIOGRAM;  Surgeon: Algernon Huxley, MD;  Location: [facility] ORS;  Service: Vascular;  Laterality: Left;  . LOWER EXTREMITY ANGIOGRAPHY Left 05/27/2018   Procedure: LOWER EXTREMITY ANGIOGRAPHY;  Surgeon: Algernon Huxley, MD;  Location: [facility];  Service: Cardiovascular;  Laterality: Left;  . LOWER EXTREMITY ANGIOGRAPHY Right 12/07/2018   Procedure:  LOWER EXTREMITY ANGIOGRAPHY;  Surgeon: Algernon Huxley, MD;  Location: [facility];  Service: Cardiovascular;  Laterality: Right;  . LOWER EXTREMITY ANGIOGRAPHY Left 05/10/2019   Procedure: LOWER EXTREMITY ANGIOGRAPHY;  Surgeon: Algernon Huxley, MD;  Location: [facility];  Service: Cardiovascular;  Laterality: Left;  . MOUTH SURGERY     root canals and crowns and extractions  . OVARY SURGERY    . SKIN GRAFT Right 2018   RT foot. foot has been rebuilt.  it is full of metal  . TENOTOMY ACHILLES TENDON Right    Percuntaneous. metal in foot  . TONSILLECTOMY    . WOUND DEBRIDEMENT Left 07/23/2018   Procedure: DEBRIDEMENT WOUND;  Surgeon: Algernon Huxley, MD;  Location: [facility] ORS;  Service: Vascular;  Laterality: Left;    There were no vitals filed for this visit.  Subjective Assessment - 06/23/19 1335    Subjective  Patient reports she is felling well today. States she has 2-3/10 pain in the R knee and low back upon arrival. She states she felt pretty good following last treatment session.    Pertinent History  Patient is a 73 y.o. female who presents to outpatient physical therapy with a referral for medical diagnosis of degeneration of lumbar intervertebral disc. This patient's chief complaints consist of chronic low back pain and foot pain, weakness, stiffness, leading to the following functional deficits: difficulty with with basic ADLs, IADLs, ambulation. She used to get injections to help control back pain but it has recently worsened due to being unable to get injections during [place] pandemic. The patient has been informed of current processes in place at [facility]patient [facility]hab to protect patients from Covid-19 exposure including social distancing, schedule modifications, and new cleaning procedures. After discussing their particular risk with a therapist based on the patient's personal risk factors, the patient has decided to proceed with in-person therapy. Pt is scheduled for R LE  angiography on 12/07/2018. She has recently started smoking again with the stress of COVID19 pandemic and feels strongly she needs to stop. Surgeon has stated in the past he will not complete surgery if she is smoking.  Relevant past medical history and comorbidities include underwent extensive vascular surgeries in Jan 2020 to improve blood flow, similar surgery for R leg scheduled 12/07/2018. Has a history of left carotid artery stenosis, but not enough to have surgery, has a history of multiple arterial surgeries, history of R cataract surgery, colon surgery, cholecystectomy, R foot skin graft, ovary surgery, gastric bypass, R foot fusion, abdominal hysterectomy; current smoker, plantar fasciitis, peripheral arterial disease, LVH, diabetes  mellitus with neuropathy, irregular heartbeat, hiatal hernia, GERD, stenosis of left heart ventricle, depression, lumbar degenerative disc disease, cardiac murmur, asthma, anxiety, obesity, HTN, and seasonal allergies She has been advised not to lift over 10 pounds by physician    Limitations  Sitting;Standing;Walking;House hold activities;Lifting    How long can you sit comfortably?  > 1 hour in almost any chair    How long can you stand comfortably?  10-15 min while cooking    How long can you walk comfortably?  2 times to the mailbox and back (120 feet to mailbox) with single point cane. Can ambulate 1 hour with rollator with seated resting breaks.    Diagnostic tests  MRI lumbar spine report 06/14/2017: "IMPRESSION: 1. Stable degenerative of lumbar spondylosis and scoliosis with multilevel disc disease and facet disease. 2. Stable right foraminal stenosis at L5-S1 due to right-sided disc    Patient Stated Goals  patient would like to get back to PLOF, walking further, be better able to participate in dog showing and trips    Currently in Pain?  Yes    Pain Score  3     Pain Onset  More than a month ago       TREATMENT  Patient goal until next session: 2 laps  walking around outside of home each day.  Therapeutic exercise:to centralize symptoms and improve ROM, strength, muscular endurance, and activity tolerance required for successful completion of functional activities.  Seated exercises with trunk unsupported sitting on dynadisc to improve core strength:  - B shoulder flexion, 2x10 (3#) - B shoulder abduction, 2x10 (3#) - B horizontal shoulder abduction, yellow theraband loop, 2x15 - pallof press, red theraband loop, 2x10 each side - seated marching without UE support, 2x1 min - Rows against green theraband, 2x30  Sit <> stand from22inchseatwith no UE support, cuing for glute contraction.2x15. R knee painful.   Mat Exercises:   sidelying hip abductionx15/x5 L, 2x10 R with manual support to maintain proper form.   Supine bridgesx20  Supine abdominal brace with active stragiht leg raise.x11/x4/x10 L,  x20 R   Cuing for improved effort, purpose of exercise, translation to personal functional goals. Education about HEP.   HOME EXERCISE PROGRAM Access Code: [email or web address]  URL: [email or web address]  Date: 05/18/2019  Prepared by: Rosita Kea   Exercises  Walking - 1.5 to the mailbox and back - 1.5 2 minutes in the house - 1x daily - 7x weekly  Leg stretch - 10 reps - 3 seconds hold - 2x daily - 7x weekly  Seated Shoulder Flexion with Dumbbells - 2 sets - 5 reps - 1 second hold - 1x daily - 7x weekly  Seated Shoulder Abduction with Dumbbells - Thumbs Up - 2 sets - 5 reps - 1 second hold - 1x daily - 7x weekly  Seated Shoulder Horizontal Abduction with Resistance - 2 sets - 10 reps - 1 second hold - 1x daily - 7x weekly   PT Education - 06/23/19 1337    Education provided  Yes    Education Details  Exercise purpose/form. Self management techniques.    Person(s) Educated  Patient    Methods  Explanation;Demonstration;Tactile cues;Verbal cues    Comprehension  Verbalized understanding;Returned  demonstration;Verbal cues required;Need further instruction;Tactile cues required       PT Short Term Goals - 05/31/19 2104      PT SHORT TERM GOAL #1   Title  Be independent with initial home exercise program for self-management of symptoms.  Baseline  Initial HEP provided at [facility] (11/17/2018); was completing until vascular procedure, re-instated 12/15/2018 (12/15/2018); currently participating (01/26/2019); Partially doing HEP due the patient's recent health condition/weakness(03/23/2019); Patient has been doing her HEP every other day. 04/20/2019; has returned to completing her walking HEP as prescribed (05/31/2019);    Time  2    Period  Weeks    Status  Achieved    Target Date  05/04/19        PT Long Term Goals - 05/31/19 2104      PT LONG TERM GOAL #1   Title  Be independent with a long-term home exercise program for self-management of symptoms.     Baseline  Initial HEP provided at [facility] (11/17/2018); was doing better before vascular surgery, is working at returning to [facility] since procedure (12/15/2018); currently participating but not assigned final long term HEP yet (01/26/2019); currently participating but not assigned final long term HEP yet (03/23/2019); HEP progressing (04/20/2019; 05/31/2019);    Time  12    Period  Weeks    Status  Partially Met    Target Date  08/23/19      PT LONG TERM GOAL #2   Title  Patient will improve 6MWT to equal or greater than 1000 feet with LRAD to demonstrate improved activity tolerance for community ambulation.     Baseline  6 Minute Walk Test: 200 feet with SPC and supervision. Stopped at 1:43 due to fatigue and leg pain. (11/17/2018); 457 feet SPC, on seated and one standing break (12/15/2018); 547 with SPC, 2 seated and one standing rest break (01/26/2019); 468 feet with SPC, 2 seated breaks(03/23/2019); 638 feet with SPC, 2 seated breaks; 747 feet with SPC and 2 standing rest breaks (05/31/2019);    Time  12    Period  Weeks    Status  Partially Met     Target Date  08/23/19      PT LONG TERM GOAL #3   Title  Patient will demonstrate improved 5 Times Sit to Stand test to 11 seconds from chair height with no UE support to demonstrate improvement in LE power and functional strength for daily activities.    Baseline  5 Time Sit to Stand: 27 seconds from chair height no UE support (11/17/2018); 18 seconds, no UE support from chair-high plinth (12/15/2018); 16 seconds with no UE support from chair (01/26/2019); 17.45s with lack of knee extension in standing(03/23/2019); 14.03s without BUEs help (04/20/2019); 16 seconds without BUE help from 18.5 inch seat (05/31/2019);    Time  12    Period  Weeks    Status  Partially Met    Target Date  08/23/19      PT LONG TERM GOAL #4   Title  Patient will ambulate faster than 1.93ms on the 10 Meter Walk Test to improve ability to cross street safey for community participation(05/21/2019); 276m with SPC (05/31/2019);    Baseline  testing deferred to next session (11/17/2018); 1.37 m/sec with SPC (11/24/2018); 1.3 m/sec with SPC (12/15/2018; 01/26/2019); 0.4721m(03/23/2019); 0.98 m/sec with SPC (04/20/2019); 1 m/sec (05/31/2019);    Time  12    Period  Weeks    Status  Partially Met    Target Date  08/23/19      PT LONG TERM GOAL #5   Title  Patient will score equal or greater than  25/30 on the Functional Gait Assessment to demonstrate low fall risk to improve her ability to participate safely in community activities.  Baseline  testing deferred to next session (11/17/2018); 11/30 high fall risk (11/24/2018); unable to measure due to time limitation (12/15/2018); 13/30 high fall risk (12/17/2018); 14/30 high fall risk (01/26/2019); 14/30 high fall risk(03/23/2019); 17/30 high fall risk (04/20/2019); 21/30 moderate fall risk (05/31/2019);    Time  12    Period  Weeks    Status  Partially Met    Target Date  08/23/19      PT LONG TERM GOAL #6   Title  Reduce pain to equal or less than 4/10 with functional  activities to allow patient to complete valued functional tasks such as caring for dogs, grocery shopping, walking with less difficulty.(03/23/2019);    Baseline  8/10 (01/26/2019); 6/10 (03/23/2019); 4/10 (04/20/2019); 4/10 (05/31/2019);    Time  12    Period  Weeks    Status  Partially Met    Target Date  08/23/19            Plan - 06/23/19 1356    Clinical Impression Statement  Patient tolerated treatment well and was able to complete more UE/core exercises as well as LE strengthening this session. Fatigued quickly and felt some cramping with LE mat exercises today performed at end of session but was able to finish without lasting increase in pain. Patient would benefit from continued physical therapy to address remaining impairments and functional limitations to work towards stated goals and return to PLOF or maximal functional independence.    Personal Factors and Comorbidities  Age;Comorbidity 3+;Fitness;Time since onset of injury/illness/exacerbation;Past/Current Experience;Other    Comorbidities  underwent extensive vascular surgeries in Jan 2020 to improve blood flow, similar surgery for R leg scheduled 12/07/2018. Has a history of left carotid artery stenosis, but not enough to have surgery, has a history of multiple arterial surgeries, history of R cataract surgery, colon surgery, cholecystectomy, R foot skin graft, ovary surgery, gastric bypass, R foot fusion, abdominal hysterectomy; current smoker, plantar fasciitis, peripheral arterial disease, LVH, diabetes mellitus with neuropathy, irregular heartbeat, hiatal hernia, GERD, stenosis of left heart ventricle, depression, lumbar degenerative disc disease, cardiac murmur, asthma, anxiety, obesity, HTN, and seasonal allergies She has been advised not to lift over 10 pounds by physician.      Examination-Activity Limitations  Bathing;Carry;Lift;Sit;Stand;Locomotion Level;Toileting;Dressing;Squat;Transfers;Stairs;Hygiene/Grooming;Other     Examination-Participation Restrictions  Community Activity;Volunteer;Cleaning;Interpersonal Relationship;Yard Work;Other    Rehab Potential  Fair    Clinical Impairments Affecting Rehab Potential  (+) motivated (-) muliple co morbidities, chronic condition    PT Frequency  2x / week    PT Duration  12 weeks    PT Treatment/Interventions  Moist Heat;Patient/family education;Neuromuscular re-education;Therapeutic exercise;Manual techniques;ADLs/Self Care Home Management;Electrical Stimulation;Gait training;Stair training;Functional mobility training;Therapeutic activities;Balance training;Passive range of motion;Dry needling;Energy conservation;Spinal Manipulations;Joint Manipulations;Cryotherapy    PT Next Visit Plan  strengthening, pain control, and promotion of self-management techniques    PT Home Exercise Plan  Medbridge Access Code: [email or web address]    Consulted and Agree with Plan of Care  Patient       Patient will benefit from skilled therapeutic intervention in order to improve the following deficits and impairments:  Decreased strength, Impaired flexibility, Decreased activity tolerance, Impaired perceived functional ability, Pain, Decreased endurance, Difficulty walking, Abnormal gait, Decreased knowledge of use of DME, Decreased skin integrity, Decreased range of motion, Impaired sensation, Improper body mechanics, Obesity, Postural dysfunction, Increased edema, Decreased mobility, Decreased balance, Cardiopulmonary status limiting activity, Decreased coordination  Visit Diagnosis: Muscle weakness (generalized)  Chronic bilateral low back pain, unspecified whether sciatica present  Difficulty  in walking, not elsewhere classified  History of falling     Problem List Patient Active Problem List   Diagnosis Date Noted  . Tobacco use disorder 06/15/2019  . Osteopenia 03/03/2019  . Lower extremity edema 07/31/2018  . Pressure injury of skin 07/24/2018  . Surgical site infection  07/23/2018  . Wound of left leg 07/23/2018  . Atherosclerosis of artery of extremity with rest pain ([place]) 07/08/2018  . Shortness of breath 06/02/2018  . Bruit 06/02/2018  . Coronary artery disease of native artery of native heart with stable angina pectoris ([place]) 06/02/2018  . Atherosclerosis of native arteries of extremity with intermittent claudication ([place]) 05/19/2018  . Peripheral arterial disease ([place]) 05/06/2018  . Personal history of colonic polyps   . Aortic stenosis 12/24/2017  . LVH (left ventricular hypertrophy) 12/24/2017  . Hypertension with heart disease 12/24/2017  . Left atrial dilatation 12/12/2017  . Cardiac murmur 12/12/2017  . Gastrojejunal ulcer 12/04/2017  . Hyperphosphatemia 12/04/2017  . Spinal stenosis of lumbar region 06/18/2017  . Degeneration of lumbar intervertebral disc 06/02/2017  . Assistance needed with transportation 05/26/2017  . Financial difficulties 05/26/2017  . Needs assistance with community resources 05/26/2017  . Moderate recurrent major depression ([place]) 05/26/2017  . Non-healing wound of lower extremity 02/06/2017  . Status post ankle arthrodesis 12/11/2016  . Controlled substance agreement broken 10/11/2016  . Low back pain 09/25/2016  . Osteoarthritis of right subtalar joint 07/11/2016  . Posterior tibial tendinitis of right lower extremity 07/11/2016  . Breast cancer screening 06/18/2016  . Knee pain 04/03/2016  . Hyponatremia 03/01/2016  . High triglycerides 12/24/2015  . Pes planus of both feet 11/16/2015  . Plantar fasciitis of right foot 11/16/2015  . Heel spur 11/16/2015  . Diabetic neuropathy ([place]) 11/16/2015  . Right ankle pain 11/16/2015  . Medication monitoring encounter 11/16/2015  . Chronic pain of multiple joints 08/15/2015  . Abnormal mammogram of right breast 06/19/2015  . Cerumen impaction 03/16/2015  . Hyperlipidemia 12/14/2014  . History of transfusion of packed RBC 12/14/2014  . Hx of smoking 12/14/2014  .  Status post bariatric surgery 12/14/2014  . Morbid obesity ([place]) 12/14/2014  . Chronic radicular lumbar pain 12/14/2014  . History of GI bleed 11/12/2014  . History of small bowel obstruction 09/07/2014    Everlean Alstrom. [name], PT, DPT 06/23/19, 1:58 PM  [facility] [address] [place], [address] Phone: [phone]   Fax:  [phone]  Name: SANDY HAYE MRN: [id number] Date of Birth: 09-30-1945

## 2019-06-23 NOTE — Telephone Encounter (Signed)
COVID antibody testing is not recommended at this time.  It is not a reliable test.  Regardless as to whether or not she had COVID-19 in the past, she needs to follow the health department's recommendations for quarantining at present.

## 2019-06-23 NOTE — Telephone Encounter (Signed)
FYI

## 2019-06-28 ENCOUNTER — Telehealth: Payer: Self-pay | Admitting: Family Medicine

## 2019-06-28 ENCOUNTER — Ambulatory Visit: Payer: Medicare Other | Admitting: Physical Therapy

## 2019-06-28 NOTE — Telephone Encounter (Signed)
Patient is calling to request her lab results from 06/03/2019. Please advise [phone]

## 2019-06-28 NOTE — Telephone Encounter (Signed)
Message was given to the patient she states that she has had had 2 COVID test prior to October. And patient states that she had been quarantining.

## 2019-06-28 NOTE — Telephone Encounter (Signed)
Pt renotified

## 2019-06-29 ENCOUNTER — Other Ambulatory Visit: Payer: Self-pay

## 2019-06-29 ENCOUNTER — Ambulatory Visit: Payer: Medicare Other | Admitting: Physical Therapy

## 2019-06-29 ENCOUNTER — Encounter: Payer: Self-pay | Admitting: Physical Therapy

## 2019-06-29 DIAGNOSIS — M6281 Muscle weakness (generalized): Secondary | ICD-10-CM | POA: Diagnosis not present

## 2019-06-29 DIAGNOSIS — R262 Difficulty in walking, not elsewhere classified: Secondary | ICD-10-CM

## 2019-06-29 DIAGNOSIS — G8929 Other chronic pain: Secondary | ICD-10-CM

## 2019-06-29 DIAGNOSIS — Z9181 History of falling: Secondary | ICD-10-CM

## 2019-06-29 NOTE — Therapy (Signed)
[facility] [address], [place], [address] Phone: [phone]   Fax:  [phone]  Physical Therapy Treatment  Patient Details  Name: Andrea Holden MRN: [id number] Date of Birth: 21-Dec-1945 Referring Provider (PT): Fredderick Severance, NP    Encounter Date: 06/29/2019  PT End of Session - 06/29/19 1128    Visit Number  48    Number of Visits  65    Date for PT Re-Evaluation  08/23/19    Authorization Type  [facility] Medicare reporting period from 05/31/2019    Authorization - Visit Number  8    Authorization - Number of Visits  10    PT Start Time  0867    PT Stop Time  6195    PT Time Calculation (min)  38 min    Equipment Utilized During Treatment  Gait belt    Activity Tolerance  Patient limited by pain;Patient tolerated treatment well;Patient limited by fatigue    Behavior During Therapy  [facility] for tasks assessed/performed       Past Medical History:  Diagnosis Date  . Allergy    seasonal allergies  . Anemia 2014   needed 5 units of blood d/t passing out, weak  . Anxiety   . Aortic stenosis 12/24/2017   Echo Aug 2018  . Arthritis   . Asthma    allergy induced asthma  . Cardiac murmur 12/12/2017  . Cataract   . Cataract    left  . Complication of anesthesia    arrhythmia following colonoscopy  . Degenerative disc disease, lumbar   . Degenerative disc disease, lumbar   . Depression   . Diabetes mellitus   . Diabetic neuropathy ([place]) 11/16/2015  . Dysrhythmia    stenosis of left ventricle  . GERD (gastroesophageal reflux disease)   . H/O transfusion    patient was given 5 units of blood while at [facility], blood type O+  . History of chicken pox   . History of hiatal hernia   . History of measles, mumps, or rubella   . HOH (hard of hearing)    does not use hearing aides yet  . Hyperlipidemia   . Hypertension   . Irregular heartbeat   . LVH (left ventricular hypertrophy) 12/24/2017    Echo Aug 2018  . Neuropathy   . Opiate use 11/16/2015  . Peripheral arterial disease ([place]) 05/06/2018   At rest, left > right; refer to vasc  . Pes planus of both feet 11/16/2015  . Plantar fasciitis of right foot 11/16/2015  . Wheezing     Past Surgical History:  Procedure Laterality Date  . ABDOMINAL HYSTERECTOMY    . APPLICATION OF WOUND VAC Left 07/23/2018   Procedure: APPLICATION OF WOUND VAC;  Surgeon: Algernon Huxley, MD;  Location: [facility] ORS;  Service: Vascular;  Laterality: Left;  . banck injections    . CATARACT EXTRACTION W/PHACO Right 05/30/2015   Procedure: CATARACT EXTRACTION PHACO AND INTRAOCULAR LENS PLACEMENT (IOC);  Surgeon: Birder Robson, MD;  Location: [facility] ORS;  Service: Ophthalmology;  Laterality: Right;  [place] 00:57 AP% 20.9 CDE 11.99 fluid pack lot #[id number]  . CHOLECYSTECTOMY  1970  . COLON SURGERY  2013   blocked colon  . COLONOSCOPY WITH PROPOFOL N/A 01/20/2018   Procedure: COLONOSCOPY WITH PROPOFOL;  Surgeon: Lucilla Lame, MD;  Location: [facility] ENDOSCOPY;  Service: Endoscopy;  Laterality: N/A;  . ENDARTERECTOMY FEMORAL Left 07/08/2018   Procedure: ENDARTERECTOMY FEMORAL;  Surgeon:  Algernon Huxley, MD;  Location: [facility] ORS;  Service: Vascular;  Laterality: Left;  . EYE SURGERY Right    cataract surgery  . FOOT FUSION Right 2018   metal in foot  . GALLBLADDER SURGERY  1970  . GASTRIC BYPASS  2010   lost 178 lbs and regained 40 lbs last few years  . HUMERUS FRACTURE SURGERY Right    metal plate with screws  . internal bleeding  2016   ulcer in past  . LOWER EXTREMITY ANGIOGRAM Left 07/08/2018   Procedure: LOWER EXTREMITY ANGIOGRAM;  Surgeon: Algernon Huxley, MD;  Location: [facility] ORS;  Service: Vascular;  Laterality: Left;  . LOWER EXTREMITY ANGIOGRAPHY Left 05/27/2018   Procedure: LOWER EXTREMITY ANGIOGRAPHY;  Surgeon: Algernon Huxley, MD;  Location: [facility];  Service: Cardiovascular;  Laterality: Left;  . LOWER EXTREMITY ANGIOGRAPHY Right 12/07/2018   Procedure:  LOWER EXTREMITY ANGIOGRAPHY;  Surgeon: Algernon Huxley, MD;  Location: [facility];  Service: Cardiovascular;  Laterality: Right;  . LOWER EXTREMITY ANGIOGRAPHY Left 05/10/2019   Procedure: LOWER EXTREMITY ANGIOGRAPHY;  Surgeon: Algernon Huxley, MD;  Location: [place];  Service: Cardiovascular;  Laterality: Left;  . MOUTH SURGERY     root canals and crowns and extractions  . OVARY SURGERY    . SKIN GRAFT Right 2018   RT foot. foot has been rebuilt.  it is full of metal  . TENOTOMY ACHILLES TENDON Right    Percuntaneous. metal in foot  . TONSILLECTOMY    . WOUND DEBRIDEMENT Left 07/23/2018   Procedure: DEBRIDEMENT WOUND;  Surgeon: Algernon Huxley, MD;  Location: [facility] ORS;  Service: Vascular;  Laterality: Left;    There were no vitals filed for this visit.  Subjective Assessment - 06/29/19 1129    Subjective  Patient reports her knee is hurting quite a bit today. Rates pain there at 4.5/10 and also has pain in the low back 2-3/10 pain. She requests not to do any mat exercises today for the leg exercises because her knew felt better prior to last visit but now it is hurting (and she did mat exercises last session). She has been doing 2 laps of walking around the house most days. She sometimes makes up an extra lap in the afternoon if she misses a day.    Pertinent History  Patient is a 73 y.o. female who presents to outpatient physical therapy with a referral for medical diagnosis of degeneration of lumbar intervertebral disc. This patient's chief complaints consist of chronic low back pain and foot pain, weakness, stiffness, leading to the following functional deficits: difficulty with with basic ADLs, IADLs, ambulation. She used to get injections to help control back pain but it has recently worsened due to being unable to get injections during [place] pandemic. The patient has been informed of current processes in place at Outpatient Rehab to protect patients from Covid-19 exposure  including social distancing, schedule modifications, and new cleaning procedures. After discussing their particular risk with a therapist based on the patient's personal risk factors, the patient has decided to proceed with in-person therapy. Pt is scheduled for R LE angiography on 12/07/2018. She has recently started smoking again with the stress of COVID19 pandemic and feels strongly she needs to stop. Surgeon has stated in the past he will not complete surgery if she is smoking.  Relevant past medical history and comorbidities include underwent extensive vascular surgeries in Jan 2020 to improve blood flow, similar surgery  for R leg scheduled 12/07/2018. Has a history of left carotid artery stenosis, but not enough to have surgery, has a history of multiple arterial surgeries, history of R cataract surgery, colon surgery, cholecystectomy, R foot skin graft, ovary surgery, gastric bypass, R foot fusion, abdominal hysterectomy; current smoker, plantar fasciitis, peripheral arterial disease, LVH, diabetes mellitus with neuropathy, irregular heartbeat, hiatal hernia, GERD, stenosis of left heart ventricle, depression, lumbar degenerative disc disease, cardiac murmur, asthma, anxiety, obesity, HTN, and seasonal allergies She has been advised not to lift over 10 pounds by physician    Limitations  Sitting;Standing;Walking;House hold activities;Lifting    How long can you sit comfortably?  > 1 hour in almost any chair    How long can you stand comfortably?  10-15 min while cooking    How long can you walk comfortably?  2 times to the mailbox and back (120 feet to mailbox) with single point cane. Can ambulate 1 hour with rollator with seated resting breaks.    Diagnostic tests  MRI lumbar spine report 06/14/2017: "IMPRESSION: 1. Stable degenerative of lumbar spondylosis and scoliosis with multilevel disc disease and facet disease. 2. Stable right foraminal stenosis at L5-S1 due to right-sided disc    Patient Stated  Goals  patient would like to get back to PLOF, walking further, be better able to participate in dog showing and trips    Currently in Pain?  Yes    Pain Score  4     Pain Onset  More than a month ago       TREATMENT  Patient goal until next session: 2 laps walking around outside of home each day.  Therapeutic exercise:to centralize symptoms and improve ROM, strength, muscular endurance, and activity tolerance required for successful completion of functional activities.  LargeNuSteplevel1-2 usingBLEs.Seatsetting 8. For improved extremity mobility, muscular endurance, and activity tolerance; and to induce the analgesic effect of aerobic exercise, stimulate improved joint nutrition, and prepare body structures and systems for following interventions.during subjective exam. Average SPM =69.x10 min during subjective exam.   Sit <> stand from22inchseatwith no UE support, cuing for glute contraction.2x15. R knee painful.   Seated exercises with trunk unsupported sitting on dynadisc to improve core strength:  - B shoulder flexion,x10(4#), x10 (3#)  (suggest 2x15 at 3# next session) - B shoulder abduction,x10(4#), x10 (3#) - B horizontal shoulder abduction, yellow theraband loop, 2x15 - pallof press,redtheraband loop, 2x15 each side - seated marching without UE support,2x1 min - Rows againstgreentheraband, 2x30  Cuing for improved effort, purpose of exercise, translation to personal functional goals. Encouragement to continue HEP. Required extended rests and cuing to stay on task throughout session.    HOME EXERCISE PROGRAM Access Code: [email or web address]  URL: [email or web address]  Date: 05/18/2019  Prepared by: Rosita Kea   Exercises  Walking - 1.5 to the mailbox and back - 1.5 2 minutes in the house - 1x daily - 7x weekly  Leg stretch - 10 reps - 3 seconds hold - 2x daily - 7x weekly  Seated Shoulder Flexion with Dumbbells - 2 sets - 5  reps - 1 second hold - 1x daily - 7x weekly  Seated Shoulder Abduction with Dumbbells - Thumbs Up - 2 sets - 5 reps - 1 second hold - 1x daily - 7x weekly  Seated Shoulder Horizontal Abduction with Resistance - 2 sets - 10 reps - 1 second hold - 1x daily - 7x weekly    PT Education - 06/29/19 1148    Education provided  Yes    Education Details  Exercise purpose/form. Self management techniques.    Person(s) Educated  Patient    Methods  Explanation;Demonstration;Tactile cues;Verbal cues    Comprehension  Verbalized understanding;Returned demonstration;Verbal cues required;Tactile cues required;Need further instruction       PT Short Term Goals - 05/31/19 2104      PT SHORT TERM GOAL #1   Title  Be independent with initial home exercise program for self-management of symptoms.    Baseline  Initial HEP provided at IE (11/17/2018); was completing until vascular procedure, re-instated 12/15/2018 (12/15/2018); currently participating (01/26/2019); Partially doing HEP due the patient's recent health condition/weakness(03/23/2019); Patient has been doing her HEP every other day. 04/20/2019; has returned to completing her walking HEP as prescribed (05/31/2019);    Time  2    Period  Weeks    Status  Achieved    Target Date  05/04/19        PT Long Term Goals - 05/31/19 2104      PT LONG TERM GOAL #1   Title  Be independent with a long-term home exercise program for self-management of symptoms.     Baseline  Initial HEP provided at IE (11/17/2018); was doing better before vascular surgery, is working at returning to [facility] since procedure (12/15/2018); currently participating but not assigned final long term HEP yet (01/26/2019); currently participating but not assigned final long term HEP yet (03/23/2019); HEP progressing (04/20/2019; 05/31/2019);    Time  12    Period  Weeks    Status  Partially Met    Target Date  08/23/19      PT LONG TERM GOAL #2   Title  Patient will improve 6MWT to equal  or greater than 1000 feet with LRAD to demonstrate improved activity tolerance for community ambulation.     Baseline  6 Minute Walk Test: 200 feet with SPC and supervision. Stopped at 1:43 due to fatigue and leg pain. (11/17/2018); 457 feet SPC, on seated and one standing break (12/15/2018); 547 with SPC, 2 seated and one standing rest break (01/26/2019); 468 feet with SPC, 2 seated breaks(03/23/2019); 638 feet with SPC, 2 seated breaks; 747 feet with SPC and 2 standing rest breaks (05/31/2019);    Time  12    Period  Weeks    Status  Partially Met    Target Date  08/23/19      PT LONG TERM GOAL #3   Title  Patient will demonstrate improved 5 Times Sit to Stand test to 11 seconds from chair height with no UE support to demonstrate improvement in LE power and functional strength for daily activities.    Baseline  5 Time Sit to Stand: 27 seconds from chair height no UE support (11/17/2018); 18 seconds, no UE support from chair-high plinth (12/15/2018); 16 seconds with no UE support from chair (01/26/2019); 17.45s with lack of knee extension in standing(03/23/2019); 14.03s without BUEs help (04/20/2019); 16 seconds without BUE help from 18.5 inch seat (05/31/2019);    Time  12    Period  Weeks    Status  Partially Met    Target Date  08/23/19      PT LONG TERM GOAL #4   Title  Patient will ambulate faster than 1.66ms on the 10 Meter Walk Test to improve ability to cross street safey for community participation(05/21/2019); 257m with SPC (05/31/2019);    Baseline  testing deferred to next session (11/17/2018); 1.37 m/sec with SPC (11/24/2018); 1.3 m/sec with SPC (12/15/2018;  01/26/2019); 0.73ms (03/23/2019); 0.98 m/sec with SPC (04/20/2019); 1 m/sec (05/31/2019);    Time  12    Period  Weeks    Status  Partially Met    Target Date  08/23/19      PT LONG TERM GOAL #5   Title  Patient will score equal or greater than  25/30 on the Functional Gait Assessment to demonstrate low fall risk to improve her  ability to participate safely in community activities.     Baseline  testing deferred to next session (11/17/2018); 11/30 high fall risk (11/24/2018); unable to measure due to time limitation (12/15/2018); 13/30 high fall risk (12/17/2018); 14/30 high fall risk (01/26/2019); 14/30 high fall risk(03/23/2019); 17/30 high fall risk (04/20/2019); 21/30 moderate fall risk (05/31/2019);    Time  12    Period  Weeks    Status  Partially Met    Target Date  08/23/19      PT LONG TERM GOAL #6   Title  Reduce pain to equal or less than 4/10 with functional activities to allow patient to complete valued functional tasks such as caring for dogs, grocery shopping, walking with less difficulty.(03/23/2019);    Baseline  8/10 (01/26/2019); 6/10 (03/23/2019); 4/10 (04/20/2019); 4/10 (05/31/2019);    Time  12    Period  Weeks    Status  Partially Met    Target Date  08/23/19            Plan - 06/29/19 1138    Clinical Impression Statement  Patient tolerated treatment well overall with some difficulty due to R knee pain. Session focused on seated exercises including nustep for improved activity tolerance and LE joint nutrition as well as core and trunk strengthening. Pt 10 min late so session abbreviated. Patient would benefit from continued physical therapy to address remaining impairments and functional limitations to work towards stated goals and return to PLOF or maximal functional independence.    Personal Factors and Comorbidities  Age;Comorbidity 3+;Fitness;Time since onset of injury/illness/exacerbation;Past/Current Experience;Other    Comorbidities  underwent extensive vascular surgeries in Jan 2020 to improve blood flow, similar surgery for R leg scheduled 12/07/2018. Has a history of left carotid artery stenosis, but not enough to have surgery, has a history of multiple arterial surgeries, history of R cataract surgery, colon surgery, cholecystectomy, R foot skin graft, ovary surgery, gastric bypass, R  foot fusion, abdominal hysterectomy; current smoker, plantar fasciitis, peripheral arterial disease, LVH, diabetes mellitus with neuropathy, irregular heartbeat, hiatal hernia, GERD, stenosis of left heart ventricle, depression, lumbar degenerative disc disease, cardiac murmur, asthma, anxiety, obesity, HTN, and seasonal allergies She has been advised not to lift over 10 pounds by physician.      Examination-Activity Limitations  Bathing;Carry;Lift;Sit;Stand;Locomotion Level;Toileting;Dressing;Squat;Transfers;Stairs;Hygiene/Grooming;Other    Examination-Participation Restrictions  Community Activity;Volunteer;Cleaning;Interpersonal Relationship;Yard Work;Other    Rehab Potential  Fair    Clinical Impairments Affecting Rehab Potential  (+) motivated (-) muliple co morbidities, chronic condition    PT Frequency  2x / week    PT Duration  12 weeks    PT Treatment/Interventions  Moist Heat;Patient/family education;Neuromuscular re-education;Therapeutic exercise;Manual techniques;ADLs/Self Care Home Management;Electrical Stimulation;Gait training;Stair training;Functional mobility training;Therapeutic activities;Balance training;Passive range of motion;Dry needling;Energy conservation;Spinal Manipulations;Joint Manipulations;Cryotherapy    PT Next Visit Plan  strengthening, pain control, and promotion of self-management techniques    PT Home Exercise Plan  Medbridge Access Code: [email or web address]   Consulted and Agree with Plan of Care  Patient       Patient will benefit from skilled  therapeutic intervention in order to improve the following deficits and impairments:  Decreased strength, Impaired flexibility, Decreased activity tolerance, Impaired perceived functional ability, Pain, Decreased endurance, Difficulty walking, Abnormal gait, Decreased knowledge of use of DME, Decreased skin integrity, Decreased range of motion, Impaired sensation, Improper body mechanics, Obesity, Postural dysfunction, Increased edema,  Decreased mobility, Decreased balance, Cardiopulmonary status limiting activity, Decreased coordination  Visit Diagnosis: Muscle weakness (generalized)  Chronic bilateral low back pain, unspecified whether sciatica present  Difficulty in walking, not elsewhere classified  History of falling     Problem List Patient Active Problem List   Diagnosis Date Noted  . Tobacco use disorder 06/15/2019  . Osteopenia 03/03/2019  . Lower extremity edema 07/31/2018  . Pressure injury of skin 07/24/2018  . Surgical site infection 07/23/2018  . Wound of left leg 07/23/2018  . Atherosclerosis of artery of extremity with rest pain ([place]) 07/08/2018  . Shortness of breath 06/02/2018  . Bruit 06/02/2018  . Coronary artery disease of native artery of native heart with stable angina pectoris ([place]) 06/02/2018  . Atherosclerosis of native arteries of extremity with intermittent claudication ([place]) 05/19/2018  . Peripheral arterial disease ([place]) 05/06/2018  . Personal history of colonic polyps   . Aortic stenosis 12/24/2017  . LVH (left ventricular hypertrophy) 12/24/2017  . Hypertension with heart disease 12/24/2017  . Left atrial dilatation 12/12/2017  . Cardiac murmur 12/12/2017  . Gastrojejunal ulcer 12/04/2017  . Hyperphosphatemia 12/04/2017  . Spinal stenosis of lumbar region 06/18/2017  . Degeneration of lumbar intervertebral disc 06/02/2017  . Assistance needed with transportation 05/26/2017  . Financial difficulties 05/26/2017  . Needs assistance with community resources 05/26/2017  . Moderate recurrent major depression ([place]) 05/26/2017  . Non-healing wound of lower extremity 02/06/2017  . Status post ankle arthrodesis 12/11/2016  . Controlled substance agreement broken 10/11/2016  . Low back pain 09/25/2016  . Osteoarthritis of right subtalar joint 07/11/2016  . Posterior tibial tendinitis of right lower extremity 07/11/2016  . Breast cancer screening 06/18/2016  . Knee pain  04/03/2016  . Hyponatremia 03/01/2016  . High triglycerides 12/24/2015  . Pes planus of both feet 11/16/2015  . Plantar fasciitis of right foot 11/16/2015  . Heel spur 11/16/2015  . Diabetic neuropathy ([place]) 11/16/2015  . Right ankle pain 11/16/2015  . Medication monitoring encounter 11/16/2015  . Chronic pain of multiple joints 08/15/2015  . Abnormal mammogram of right breast 06/19/2015  . Cerumen impaction 03/16/2015  . Hyperlipidemia 12/14/2014  . History of transfusion of packed RBC 12/14/2014  . Hx of smoking 12/14/2014  . Status post bariatric surgery 12/14/2014  . Morbid obesity ([place]) 12/14/2014  . Chronic radicular lumbar pain 12/14/2014  . History of GI bleed 11/12/2014  . History of small bowel obstruction 09/07/2014    Everlean Alstrom. [name], PT, DPT 06/29/19, 12:08 PM  [facility] [address] [place], [address] Phone: [phone]   Fax:  [phone]  Name: Andrea Holden MRN: [id number] Date of Birth: [birth year]

## 2019-06-30 ENCOUNTER — Ambulatory Visit: Payer: Medicare Other | Admitting: Physical Therapy

## 2019-07-05 ENCOUNTER — Encounter: Payer: Self-pay | Admitting: Physical Therapy

## 2019-07-05 ENCOUNTER — Ambulatory Visit: Payer: Medicare Other | Attending: Family Medicine | Admitting: Physical Therapy

## 2019-07-05 ENCOUNTER — Other Ambulatory Visit: Payer: Self-pay

## 2019-07-05 DIAGNOSIS — M545 Low back pain: Secondary | ICD-10-CM | POA: Diagnosis present

## 2019-07-05 DIAGNOSIS — R262 Difficulty in walking, not elsewhere classified: Secondary | ICD-10-CM | POA: Insufficient documentation

## 2019-07-05 DIAGNOSIS — M6281 Muscle weakness (generalized): Secondary | ICD-10-CM | POA: Insufficient documentation

## 2019-07-05 DIAGNOSIS — G8929 Other chronic pain: Secondary | ICD-10-CM | POA: Insufficient documentation

## 2019-07-05 DIAGNOSIS — Z9181 History of falling: Secondary | ICD-10-CM | POA: Diagnosis present

## 2019-07-05 NOTE — Therapy (Signed)
[facility] [address], [place], [address] Phone: [phone]   Fax:  [phone]  Physical Therapy Treatment  Patient Details  Name: Andrea Holden MRN: [id number] Date of Birth: 1945-12-23 Referring Provider (PT): Fredderick Severance, NP    Encounter Date: 07/05/2019  PT End of Session - 07/05/19 1454    Visit Number  49    Number of Visits  65    Date for PT Re-Evaluation  08/23/19    Authorization Type  [facility] Medicare reporting period from 05/31/2019    Authorization - Visit Number  9    Authorization - Number of Visits  10    PT Start Time  4462    PT Stop Time  1530    PT Time Calculation (min)  41 min    Equipment Utilized During Treatment  Gait belt    Activity Tolerance  Patient limited by pain;Patient tolerated treatment well;Patient limited by fatigue    Behavior During Therapy  [facility] for tasks assessed/performed       Past Medical History:  Diagnosis Date  . Allergy    seasonal allergies  . Anemia 2014   needed 5 units of blood d/t passing out, weak  . Anxiety   . Aortic stenosis 12/24/2017   Echo Aug 2018  . Arthritis   . Asthma    allergy induced asthma  . Cardiac murmur 12/12/2017  . Cataract   . Cataract    left  . Complication of anesthesia    arrhythmia following colonoscopy  . Degenerative disc disease, lumbar   . Degenerative disc disease, lumbar   . Depression   . Diabetes mellitus   . Diabetic neuropathy ([place]) 11/16/2015  . Dysrhythmia    stenosis of left ventricle  . GERD (gastroesophageal reflux disease)   . H/O transfusion    patient was given 5 units of blood while at [place], blood type O+  . History of chicken pox   . History of hiatal hernia   . History of measles, mumps, or rubella   . HOH (hard of hearing)    does not use hearing aides yet  . Hyperlipidemia   . Hypertension   . Irregular heartbeat   . LVH (left ventricular hypertrophy) 12/24/2017    Echo Aug 2018  . Neuropathy   . Opiate use 11/16/2015  . Peripheral arterial disease ([place]) 05/06/2018   At rest, left > right; refer to vasc  . Pes planus of both feet 11/16/2015  . Plantar fasciitis of right foot 11/16/2015  . Wheezing     Past Surgical History:  Procedure Laterality Date  . ABDOMINAL HYSTERECTOMY    . APPLICATION OF WOUND VAC Left 07/23/2018   Procedure: APPLICATION OF WOUND VAC;  Surgeon: Algernon Huxley, MD;  Location: [facility] ORS;  Service: Vascular;  Laterality: Left;  . banck injections    . CATARACT EXTRACTION W/PHACO Right 05/30/2015   Procedure: CATARACT EXTRACTION PHACO AND INTRAOCULAR LENS PLACEMENT (IOC);  Surgeon: Birder Robson, MD;  Location: [facility] ORS;  Service: Ophthalmology;  Laterality: Right;  [place] 00:57 AP% 20.9 CDE 11.99 fluid pack lot #[id number]  . CHOLECYSTECTOMY  1970  . COLON SURGERY  2013   blocked colon  . COLONOSCOPY WITH PROPOFOL N/A 01/20/2018   Procedure: COLONOSCOPY WITH PROPOFOL;  Surgeon: Lucilla Lame, MD;  Location: [facility] END[facility]COPY;  Service: Endoscopy;  Laterality: N/A;  . ENDARTERECTOMY FEMORAL Left 07/08/2018   Procedure: ENDARTERECTOMY FEMORAL;  Surgeon:  Algernon Huxley, MD;  Location: [facility] ORS;  Service: Vascular;  Laterality: Left;  . EYE SURGERY Right    cataract surgery  . FOOT FUSION Right 2018   metal in foot  . GALLBLADDER SURGERY  1970  . GASTRIC BYPASS  2010   lost 178 lbs and regained 40 lbs last few years  . HUMERUS FRACTURE SURGERY Right    metal plate with screws  . internal bleeding  2016   ulcer in past  . LOWER EXTREMITY ANGIOGRAM Left 07/08/2018   Procedure: LOWER EXTREMITY ANGIOGRAM;  Surgeon: Algernon Huxley, MD;  Location: [facility] ORS;  Service: Vascular;  Laterality: Left;  . LOWER EXTREMITY ANGIOGRAPHY Left 05/27/2018   Procedure: LOWER EXTREMITY ANGIOGRAPHY;  Surgeon: Algernon Huxley, MD;  Location: [facility] CV LAB;  Service: Cardiovascular;  Laterality: Left;  . LOWER EXTREMITY ANGIOGRAPHY Right 12/07/2018   Procedure:  LOWER EXTREMITY ANGIOGRAPHY;  Surgeon: Algernon Huxley, MD;  Location: [facility];  Service: Cardiovascular;  Laterality: Right;  . LOWER EXTREMITY ANGIOGRAPHY Left 05/10/2019   Procedure: LOWER EXTREMITY ANGIOGRAPHY;  Surgeon: Algernon Huxley, MD;  Location: [facility];  Service: Cardiovascular;  Laterality: Left;  . MOUTH SURGERY     root canals and crowns and extractions  . OVARY SURGERY    . SKIN GRAFT Right 2018   RT foot. foot has been rebuilt.  it is full of metal  . TENOTOMY ACHILLES TENDON Right    Percuntaneous. metal in foot  . TONSILLECTOMY    . WOUND DEBRIDEMENT Left 07/23/2018   Procedure: DEBRIDEMENT WOUND;  Surgeon: Algernon Huxley, MD;  Location: [facility] ORS;  Service: Vascular;  Laterality: Left;    There were no vitals filed for this visit.  Subjective Assessment - 07/05/19 1450    Subjective  Patient reports she is concerned about her R knee because it has been hurting a lot and buckling a couple of times yesterday, but today it feels better. She doesn't remember how she felt following last treatment session. She report 3/10 pain in the R knee and 4/10 in the back. She reports she did not do all of her walking since last treatment session and "I have already beat myself up about it."    Pertinent History  Patient is a 74 y.o. female who presents to outpatient physical therapy with a referral for medical diagnosis of degeneration of lumbar intervertebral disc. This patient's chief complaints consist of chronic low back pain and foot pain, weakness, stiffness, leading to the following functional deficits: difficulty with with basic ADLs, IADLs, ambulation. She used to get injections to help control back pain but it has recently worsened due to being unable to get injections during [place] pandemic. The patient has been informed of current processes in place at [facility] to protect patients from Covid-19 exposure including social distancing, schedule modifications,  and new cleaning procedures. After discussing their particular risk with a therapist based on the patient's personal risk factors, the patient has decided to proceed with in-person therapy. Pt is scheduled for R LE angiography on 12/07/2018. She has recently started smoking again with the stress of COVID19 pandemic and feels strongly she needs to stop. Surgeon has stated in the past he will not complete surgery if she is smoking.  Relevant past medical history and comorbidities include underwent extensive vascular surgeries in Jan 2020 to improve blood flow, similar surgery for R leg scheduled 12/07/2018. Has a history of left carotid artery stenosis,  but not enough to have surgery, has a history of multiple arterial surgeries, history of R cataract surgery, colon surgery, cholecystectomy, R foot skin graft, ovary surgery, gastric bypass, R foot fusion, abdominal hysterectomy; current smoker, plantar fasciitis, peripheral arterial disease, LVH, diabetes mellitus with neuropathy, irregular heartbeat, hiatal hernia, GERD, stenosis of left heart ventricle, depression, lumbar degenerative disc disease, cardiac murmur, asthma, anxiety, obesity, HTN, and seasonal allergies She has been advised not to lift over 10 pounds by physician    Limitations  Sitting;Standing;Walking;House hold activities;Lifting    How long can you sit comfortably?  > 1 hour in almost any chair    How long can you stand comfortably?  10-15 min while cooking    How long can you walk comfortably?  2 times to the mailbox and back (120 feet to mailbox) with single point cane. Can ambulate 1 hour with rollator with seated resting breaks.    Diagnostic tests  MRI lumbar spine report 06/14/2017: "IMPRESSION: 1. Stable degenerative of lumbar spondylosis and scoliosis with multilevel disc disease and facet disease. 2. Stable right foraminal stenosis at L5-S1 due to right-sided disc    Patient Stated Goals  patient would like to get back to PLOF, walking  further, be better able to participate in dog showing and trips    Currently in Pain?  Yes    Pain Score  4     Pain Onset  More than a month ago       TREATMENT  Patient goal until next session: 2 laps walking around outside of home each day.  Therapeutic exercise:to centralize symptoms and improve ROM, strength, muscular endurance, and activity tolerance required for successful completion of functional activities.  LargeNuSteplevel1-2 usingBLEs.Seatsetting 8. For improved extremity mobility, muscular endurance, and activity tolerance; and to induce the analgesic effect of aerobic exercise, stimulate improved joint nutrition, and prepare body structures and systems for following interventions.during subjective exam. Average SPM =74.x24mn during subjective exam.   Mat Exercises:   sidelying hip abduction2x15 each side with manual support to maintain proper form.   Supine abdominal brace with active stragiht leg raise.2x15 each side (L 10 +5)  Supine bridges with isometric hip abduction against manual resistancex15   Sit <> stand from22inchseatwith no UE support, cuing for glute contraction.2x15.R knee painful.  Cuing for improved effort, purpose of exercise, translation to personal functional goals. Encouragement to continue HEP. Required extended rests and cuing to stay on task throughout session.    HOME EXERCISE PROGRAM (pt has 2# DB at home) Access Code: [email or web address] URL: [email or web address]  Date: 05/18/2019  Prepared by: SRosita Kea  Exercises  Walking - 1.5 to the mailbox and back - 1.5 2 minutes in the house - 1x daily - 7x weekly  Leg stretch - 10 reps - 3 seconds hold - 2x daily - 7x weekly  Seated Shoulder Flexion with Dumbbells - 2 sets - 5 reps - 1 second hold - 1x daily - 7x weekly  Seated Shoulder Abduction with Dumbbells - Thumbs Up - 2 sets - 5 reps - 1 second hold - 1x daily - 7x weekly  Seated Shoulder  Horizontal Abduction with Resistance - 2 sets - 10 reps - 1 second hold - 1x daily - 7x weekly    PT Education - 07/05/19 1454    Education provided  Yes    Education Details  Exercise purpose/form. Self management techniques.    Person(s) Educated  Patient    Methods  Explanation;Demonstration;Tactile cues;Verbal cues  Comprehension  Verbalized understanding;Returned demonstration;Verbal cues required;Tactile cues required;Need further instruction       PT Short Term Goals - 05/31/19 2104      PT SHORT TERM GOAL #1   Title  Be independent with initial home exercise program for self-management of symptoms.    Baseline  Initial HEP provided at IE (11/17/2018); was completing until vascular procedure, re-instated 12/15/2018 (12/15/2018); currently participating (01/26/2019); Partially doing HEP due the patient's recent health condition/weakness(03/23/2019); Patient has been doing her HEP every other day. 04/20/2019; has returned to completing her walking HEP as prescribed (05/31/2019);    Time  2    Period  Weeks    Status  Achieved    Target Date  05/04/19        PT Long Term Goals - 05/31/19 2104      PT LONG TERM GOAL #1   Title  Be independent with a long-term home exercise program for self-management of symptoms.     Baseline  Initial HEP provided at IE (11/17/2018); was doing better before vascular surgery, is working at returning to [facility] since procedure (12/15/2018); currently participating but not assigned final long term HEP yet (01/26/2019); currently participating but not assigned final long term HEP yet (03/23/2019); HEP progressing (04/20/2019; 05/31/2019);    Time  12    Period  Weeks    Status  Partially Met    Target Date  08/23/19      PT LONG TERM GOAL #2   Title  Patient will improve 6MWT to equal or greater than 1000 feet with LRAD to demonstrate improved activity tolerance for community ambulation.     Baseline  6 Minute Walk Test: 200 feet with SPC and  supervision. Stopped at 1:43 due to fatigue and leg pain. (11/17/2018); 457 feet SPC, on seated and one standing break (12/15/2018); 547 with SPC, 2 seated and one standing rest break (01/26/2019); 468 feet with SPC, 2 seated breaks(03/23/2019); 638 feet with SPC, 2 seated breaks; 747 feet with SPC and 2 standing rest breaks (05/31/2019);    Time  12    Period  Weeks    Status  Partially Met    Target Date  08/23/19      PT LONG TERM GOAL #3   Title  Patient will demonstrate improved 5 Times Sit to Stand test to 11 seconds from chair height with no UE support to demonstrate improvement in LE power and functional strength for daily activities.    Baseline  5 Time Sit to Stand: 27 seconds from chair height no UE support (11/17/2018); 18 seconds, no UE support from chair-high plinth (12/15/2018); 16 seconds with no UE support from chair (01/26/2019); 17.45s with lack of knee extension in standing(03/23/2019); 14.03s without BUEs help (04/20/2019); 16 seconds without BUE help from 18.5 inch seat (05/31/2019);    Time  12    Period  Weeks    Status  Partially Met    Target Date  08/23/19      PT LONG TERM GOAL #4   Title  Patient will ambulate faster than 1.53ms on the 10 Meter Walk Test to improve ability to cross street safey for community participation(05/21/2019); 234m with SPC (05/31/2019);    Baseline  testing deferred to next session (11/17/2018); 1.37 m/sec with SPC (11/24/2018); 1.3 m/sec with SPC (12/15/2018; 01/26/2019); 0.4769m(03/23/2019); 0.98 m/sec with SPC (04/20/2019); 1 m/sec (05/31/2019);    Time  12    Period  Weeks    Status  Partially Met  Target Date  08/23/19      PT LONG TERM GOAL #5   Title  Patient will score equal or greater than  25/30 on the Functional Gait Assessment to demonstrate low fall risk to improve her ability to participate safely in community activities.     Baseline  testing deferred to next session (11/17/2018); 11/30 high fall risk (11/24/2018); unable to measure  due to time limitation (12/15/2018); 13/30 high fall risk (12/17/2018); 14/30 high fall risk (01/26/2019); 14/30 high fall risk(03/23/2019); 17/30 high fall risk (04/20/2019); 21/30 moderate fall risk (05/31/2019);    Time  12    Period  Weeks    Status  Partially Met    Target Date  08/23/19      PT LONG TERM GOAL #6   Title  Reduce pain to equal or less than 4/10 with functional activities to allow patient to complete valued functional tasks such as caring for dogs, grocery shopping, walking with less difficulty.(03/23/2019);    Baseline  8/10 (01/26/2019); 6/10 (03/23/2019); 4/10 (04/20/2019); 4/10 (05/31/2019);    Time  12    Period  Weeks    Status  Partially Met    Target Date  08/23/19            Plan - 07/05/19 1527    Clinical Impression Statement  Patient tolerated treatment well overall with some difficulty due to pain in the back and R knee. She had the most discomfort with bridges, but wanted to continue due to the benefits of the exercise (only did one set of 15 due to pain). Patient continues to have weakness, stiffness, decreased activity tolerance that limits her functional mobility. Patient would benefit from continued management of limiting condition by skilled physical therapist to address remaining impairments and functional limitations to work towards stated goals and return to PLOF or maximal functional independence.    Personal Factors and Comorbidities  Age;Comorbidity 3+;Fitness;Time since onset of injury/illness/exacerbation;Past/Current Experience;Other    Comorbidities  underwent extensive vascular surgeries in Jan 2020 to improve blood flow, similar surgery for R leg scheduled 12/07/2018. Has a history of left carotid artery stenosis, but not enough to have surgery, has a history of multiple arterial surgeries, history of R cataract surgery, colon surgery, cholecystectomy, R foot skin graft, ovary surgery, gastric bypass, R foot fusion, abdominal hysterectomy; current  smoker, plantar fasciitis, peripheral arterial disease, LVH, diabetes mellitus with neuropathy, irregular heartbeat, hiatal hernia, GERD, stenosis of left heart ventricle, depression, lumbar degenerative disc disease, cardiac murmur, asthma, anxiety, obesity, HTN, and seasonal allergies She has been advised not to lift over 10 pounds by physician.      Examination-Activity Limitations  Bathing;Carry;Lift;Sit;Stand;Locomotion Level;Toileting;Dressing;Squat;Transfers;Stairs;Hygiene/Grooming;Other    Examination-Participation Restrictions  Community Activity;Volunteer;Cleaning;Interpersonal Relationship;Yard Work;Other    Rehab Potential  Fair    Clinical Impairments Affecting Rehab Potential  (+) motivated (-) muliple co morbidities, chronic condition    PT Frequency  2x / week    PT Duration  12 weeks    PT Treatment/Interventions  Moist Heat;Patient/family education;Neuromuscular re-education;Therapeutic exercise;Manual techniques;ADLs/Self Care Home Management;Electrical Stimulation;Gait training;Stair training;Functional mobility training;Therapeutic activities;Balance training;Passive range of motion;Dry needling;Energy conservation;Spinal Manipulations;Joint Manipulations;Cryotherapy    PT Next Visit Plan  strengthening, pain control, and promotion of self-management techniques    PT Home Exercise Plan  Medbridge Access Code: [email or web address]    Consulted and Agree with Plan of Care  Patient       Patient will benefit from skilled therapeutic intervention in order to improve the following deficits and impairments:  Decreased strength, Impaired flexibility, Decreased activity tolerance, Impaired perceived functional ability, Pain, Decreased endurance, Difficulty walking, Abnormal gait, Decreased knowledge of use of DME, Decreased skin integrity, Decreased range of motion, Impaired sensation, Improper body mechanics, Obesity, Postural dysfunction, Increased edema, Decreased mobility, Decreased balance,  Cardiopulmonary status limiting activity, Decreased coordination  Visit Diagnosis: Muscle weakness (generalized)  Chronic bilateral low back pain, unspecified whether sciatica present  Difficulty in walking, not elsewhere classified  History of falling     Problem List Patient Active Problem List   Diagnosis Date Noted  . Tobacco use disorder 06/15/2019  . Osteopenia 03/03/2019  . Lower extremity edema 07/31/2018  . Pressure injury of skin 07/24/2018  . Surgical site infection 07/23/2018  . Wound of left leg 07/23/2018  . Atherosclerosis of artery of extremity with rest pain ([place]) 07/08/2018  . Shortness of breath 06/02/2018  . Bruit 06/02/2018  . Coronary artery disease of native artery of native heart with stable angina pectoris ([place]) 06/02/2018  . Atherosclerosis of native arteries of extremity with intermittent claudication ([place]) 05/19/2018  . Peripheral arterial disease ([place]) 05/06/2018  . Personal history of colonic polyps   . Aortic stenosis 12/24/2017  . LVH (left ventricular hypertrophy) 12/24/2017  . Hypertension with heart disease 12/24/2017  . Left atrial dilatation 12/12/2017  . Cardiac murmur 12/12/2017  . Gastrojejunal ulcer 12/04/2017  . Hyperphosphatemia 12/04/2017  . Spinal stenosis of lumbar region 06/18/2017  . Degeneration of lumbar intervertebral disc 06/02/2017  . Assistance needed with transportation 05/26/2017  . Financial difficulties 05/26/2017  . Needs assistance with community resources 05/26/2017  . Moderate recurrent major depression ([place]) 05/26/2017  . Non-healing wound of lower extremity 02/06/2017  . Status post ankle arthrodesis 12/11/2016  . Controlled substance agreement broken 10/11/2016  . Low back pain 09/25/2016  . Osteoarthritis of right subtalar joint 07/11/2016  . Posterior tibial tendinitis of right lower extremity 07/11/2016  . Breast cancer screening 06/18/2016  . Knee pain 04/03/2016  . Hyponatremia 03/01/2016  . High  triglycerides 12/24/2015  . Pes planus of both feet 11/16/2015  . Plantar fasciitis of right foot 11/16/2015  . Heel spur 11/16/2015  . Diabetic neuropathy ([place]) 11/16/2015  . Right ankle pain 11/16/2015  . Medication monitoring encounter 11/16/2015  . Chronic pain of multiple joints 08/15/2015  . Abnormal mammogram of right breast 06/19/2015  . Cerumen impaction 03/16/2015  . Hyperlipidemia 12/14/2014  . History of transfusion of packed RBC 12/14/2014  . Hx of smoking 12/14/2014  . Status post bariatric surgery 12/14/2014  . Morbid obesity ([place]) 12/14/2014  . Chronic radicular lumbar pain 12/14/2014  . History of GI bleed 11/12/2014  . History of small bowel obstruction 09/07/2014    Everlean Alstrom. [name], PT, DPT 07/05/19, 3:28 PM  [facility] [address] [place], [address] Phone: [phone]   Fax:  [phone]  Name: Andrea Holden MRN: [id number] Date of Birth: 05/13/1946

## 2019-07-07 ENCOUNTER — Ambulatory Visit: Payer: Medicare Other | Admitting: Physical Therapy

## 2019-07-08 ENCOUNTER — Ambulatory Visit: Payer: Medicare Other | Admitting: Physical Therapy

## 2019-07-08 ENCOUNTER — Other Ambulatory Visit: Payer: Self-pay

## 2019-07-08 DIAGNOSIS — M6281 Muscle weakness (generalized): Secondary | ICD-10-CM

## 2019-07-08 DIAGNOSIS — G8929 Other chronic pain: Secondary | ICD-10-CM

## 2019-07-08 DIAGNOSIS — M545 Low back pain, unspecified: Secondary | ICD-10-CM

## 2019-07-08 DIAGNOSIS — R262 Difficulty in walking, not elsewhere classified: Secondary | ICD-10-CM

## 2019-07-08 DIAGNOSIS — Z9181 History of falling: Secondary | ICD-10-CM

## 2019-07-08 NOTE — Therapy (Addendum)
[facility] [address], [place], [address] Phone: [phone]   Fax:  [phone]  Physical Therapy Treatment / Progress Note Reporting period: 05/31/2019 - 07/08/2019  Patient Details  Name: Andrea Holden MRN: [id number] Date of Birth: Jan 07, 1946 Referring Provider (PT): Fredderick Severance, NP    Encounter Date: 07/08/2019  PT End of Session - 07/08/19 1039    Visit Number  50    Number of Visits  65    Date for PT Re-Evaluation  08/23/19    Authorization Type  [facility] Medicare reporting period from 05/31/2019    Authorization - Visit Number  10    Authorization - Number of Visits  10    PT Start Time  9935    PT Stop Time  1115    PT Time Calculation (min)  35 min    Equipment Utilized During Treatment  Gait belt    Activity Tolerance  Patient limited by pain;Patient tolerated treatment well;Patient limited by fatigue    Behavior During Therapy  [facility] for tasks assessed/performed       Past Medical History:  Diagnosis Date  . Allergy    seasonal allergies  . Anemia 2014   needed 5 units of blood d/t passing out, weak  . Anxiety   . Aortic stenosis 12/24/2017   Echo Aug 2018  . Arthritis   . Asthma    allergy induced asthma  . Cardiac murmur 12/12/2017  . Cataract   . Cataract    left  . Complication of anesthesia    arrhythmia following colonoscopy  . Degenerative disc disease, lumbar   . Degenerative disc disease, lumbar   . Depression   . Diabetes mellitus   . Diabetic neuropathy ([place]) 11/16/2015  . Dysrhythmia    stenosis of left ventricle  . GERD (gastroesophageal reflux disease)   . H/O transfusion    patient was given 5 units of blood while at [place], blood type O+  . History of chicken pox   . History of hiatal hernia   . History of measles, mumps, or rubella   . HOH (hard of hearing)    does not use hearing aides yet  . Hyperlipidemia   . Hypertension   . Irregular  heartbeat   . LVH (left ventricular hypertrophy) 12/24/2017   Echo Aug 2018  . Neuropathy   . Opiate use 11/16/2015  . Peripheral arterial disease ([place]) 05/06/2018   At rest, left > right; refer to vasc  . Pes planus of both feet 11/16/2015  . Plantar fasciitis of right foot 11/16/2015  . Wheezing     Past Surgical History:  Procedure Laterality Date  . ABDOMINAL HYSTERECTOMY    . APPLICATION OF WOUND VAC Left 07/23/2018   Procedure: APPLICATION OF WOUND VAC;  Surgeon: Algernon Huxley, MD;  Location: [facility] ORS;  Service: Vascular;  Laterality: Left;  . banck injections    . CATARACT EXTRACTION W/PHACO Right 05/30/2015   Procedure: CATARACT EXTRACTION PHACO AND INTRAOCULAR LENS PLACEMENT (IOC);  Surgeon: Birder Robson, MD;  Location: [facility] ORS;  Service: Ophthalmology;  Laterality: Right;  [place] 00:57 AP% 20.9 CDE 11.99 fluid pack lot #[id number]  . CHOLECYSTECTOMY  1970  . COLON SURGERY  2013   blocked colon  . COLONOSCOPY WITH PROPOFOL N/A 01/20/2018   Procedure: COLONOSCOPY WITH PROPOFOL;  Surgeon: Lucilla Lame, MD;  Location: [facility] ENDOSCOPY;  Service: Endoscopy;  Laterality: N/A;  . ENDARTERECTOMY FEMORAL Left  07/08/2018   Procedure: ENDARTERECTOMY FEMORAL;  Surgeon: Algernon Huxley, MD;  Location: [facility] ORS;  Service: Vascular;  Laterality: Left;  . EYE SURGERY Right    cataract surgery  . FOOT FUSION Right 2018   metal in foot  . GALLBLADDER SURGERY  1970  . GASTRIC BYPASS  2010   lost 178 lbs and regained 40 lbs last few years  . HUMERUS FRACTURE SURGERY Right    metal plate with screws  . internal bleeding  2016   ulcer in past  . LOWER EXTREMITY ANGIOGRAM Left 07/08/2018   Procedure: LOWER EXTREMITY ANGIOGRAM;  Surgeon: Algernon Huxley, MD;  Location: [facility] ORS;  Service: Vascular;  Laterality: Left;  . LOWER EXTREMITY ANGIOGRAPHY Left 05/27/2018   Procedure: LOWER EXTREMITY ANGIOGRAPHY;  Surgeon: Algernon Huxley, MD;  Location: [place] CV LAB;  Service: Cardiovascular;  Laterality:  Left;  . LOWER EXTREMITY ANGIOGRAPHY Right 12/07/2018   Procedure: LOWER EXTREMITY ANGIOGRAPHY;  Surgeon: Algernon Huxley, MD;  Location: [place] [facility];  Service: Cardiovascular;  Laterality: Right;  . LOWER EXTREMITY ANGIOGRAPHY Left 05/10/2019   Procedure: LOWER EXTREMITY ANGIOGRAPHY;  Surgeon: Algernon Huxley, MD;  Location: [place] [facility];  Service: Cardiovascular;  Laterality: Left;  . MOUTH SURGERY     root canals and crowns and extractions  . OVARY SURGERY    . SKIN GRAFT Right 2018   RT foot. foot has been rebuilt.  it is full of metal  . TENOTOMY ACHILLES TENDON Right    Percuntaneous. metal in foot  . TONSILLECTOMY    . WOUND DEBRIDEMENT Left 07/23/2018   Procedure: DEBRIDEMENT WOUND;  Surgeon: Algernon Huxley, MD;  Location: [facility] ORS;  Service: Vascular;  Laterality: Left;    There were no vitals filed for this visit.  Subjective Assessment - 07/08/19 1049    Subjective  Patinet reports her L bunion pain (5/10) has flaired up since last treatment session and her R knee (5/10) is hurting upon arrival. States her low back is not hurting upon arrival but gets up to 5/10 over the past two weeks. Felt okay following last session. Back has been doing better overall, but she still gets pain with a lot of activity. Patient reports she feels like physical therapy is helping her get stronger. She feels like she needs more intervention for balance and does not feel she has a good idea of how to improve this at home.    Pertinent History  Patient is a 74 y.o. female who presents to outpatient physical therapy with a referral for medical diagnosis of degeneration of lumbar intervertebral disc. This patient's chief complaints consist of chronic low back pain and foot pain, weakness, stiffness, leading to the following functional deficits: difficulty with with basic ADLs, IADLs, ambulation. She used to get injections to help control back pain but it has recently worsened due to being unable to get  injections during [place] pandemic. The patient has been informed of current processes in place at [facility]patient [facility] to protect patients from Covid-19 exposure including social distancing, schedule modifications, and new cleaning procedures. After discussing their particular risk with a therapist based on the patient's personal risk factors, the patient has decided to proceed with in-person therapy. Pt is scheduled for R LE angiography on 12/07/2018. She has recently started smoking again with the stress of COVID19 pandemic and feels strongly she needs to stop. Surgeon has stated in the past he will not complete surgery if she is smoking.  Relevant past medical history and comorbidities include underwent extensive vascular surgeries in Jan 2020 to improve blood flow, similar surgery for R leg scheduled 12/07/2018. Has a history of left carotid artery stenosis, but not enough to have surgery, has a history of multiple arterial surgeries, history of R cataract surgery, colon surgery, cholecystectomy, R foot skin graft, ovary surgery, gastric bypass, R foot fusion, abdominal hysterectomy; current smoker, plantar fasciitis, peripheral arterial disease, LVH, diabetes mellitus with neuropathy, irregular heartbeat, hiatal hernia, GERD, stenosis of left heart ventricle, depression, lumbar degenerative disc disease, cardiac murmur, asthma, anxiety, obesity, HTN, and seasonal allergies She has been advised not to lift over 10 pounds by physician    Limitations  Sitting;Standing;Walking;House hold activities;Lifting    How long can you sit comfortably?  > 1 hour in almost any chair    How long can you stand comfortably?  10-15 min while cooking    How long can you walk comfortably?  2 times to the mailbox and back (120 feet to mailbox) with single point cane. Can ambulate 1 hour with rollator with seated resting breaks.    Diagnostic tests  MRI lumbar spine report 06/14/2017: "IMPRESSION: 1. Stable degenerative of lumbar  spondylosis and scoliosis with multilevel disc disease and facet disease. 2. Stable right foraminal stenosis at L5-S1 due to right-sided disc    Patient Stated Goals  patient would like to get back to PLOF, walking further, be better able to participate in dog showing and trips    Currently in Pain?  Yes    Pain Score  5     Pain Location  Knee   knee; L first MTP joint   Pain Orientation  Right;Left    Pain Onset  More than a month ago    Effect of Pain on Daily Activities  difficulty with all daily tasks involving standing, walking, sitting, unable to show dogs, difficulty moving chairs at dog shows, getting down on one knee, walk outside, walk dogs (improving walking distance, ability to lift and move items around       [facility] PT Assessment - 07/08/19 0001      Assessment   Medical Diagnosis  Degeneration of lumbar intervertebral disc    Referring Provider (PT)  Poulose, Bethel Born, NP     Hand Dominance  Right    Prior Therapy  participated several times with improved function.       Precautions   Precautions  Other (comment)   no lifting over 10#     Restrictions   Weight Bearing Restrictions  No      Balance Screen   Has the patient fallen in the past 6 months  No    Has the patient had a decrease in activity level because of a fear of falling?   No    Is the patient reluctant to leave their home because of a fear of falling?   No      Home Film/video editor residence    [place]   with two dogs (Shit tzu and Magazine features editor).    Type of [place] to enter    Entrance Stairs-Number of Steps  3    Entrance Stairs-Rails  Right;Left;Can reach both    Home Layout  One level    Home Equipment  Tub bench;Bedside commode;Other (comment);Cane - single point;Walker - 2 wheels;Grab bars - tub/shower;Grab bars - toilet   rollator  Prior Function   Level of Independence  Independent    Vocation  Part time  employment   currently closed due to [place] 19 pandemic   Vocation Requirements  substitute teaching    Leisure  life revolves around dog showing (comfirmation and obedience), currently unable to do       Cognition   Overall Cognitive Status  Within Functional Limits for tasks assessed      Observation/Other Assessments   Observations  see note from     Focus on Therapeutic Outcomes (FOTO)   FOTO = 41 (07/08/2019)      6 minute walk test results    Aerobic Endurance Distance Walked  628   SPC, 3 standing breaks      Standardized Balance Assessment   Five times sit to stand comments   16 seconds without UE help    10 Meter Walk  0.75 m/s      Functional Gait  Assessment   Gait Level Surface  Walks 20 ft, slow speed, abnormal gait pattern, evidence for imbalance or deviates 10-15 in outside of the 12 in walkway width. Requires more than 7 sec to ambulate 20 ft.    Change in Gait Speed  Able to change speed, demonstrates mild gait deviations, deviates 6-10 in outside of the 12 in walkway width, or no gait deviations, unable to achieve a major change in velocity, or uses a change in velocity, or uses an assistive device.    Gait with Horizontal Head Turns  Performs head turns smoothly with no change in gait. Deviates no more than 6 in outside 12 in walkway width    Gait with Vertical Head Turns  Performs head turns with no change in gait. Deviates no more than 6 in outside 12 in walkway width.    Gait and Pivot Turn  Pivot turns safely within 3 sec and stops quickly with no loss of balance.    Step Over Obstacle  Is able to step over one shoe box (4.5 in total height) but must slow down and adjust steps to clear box safely. May require verbal cueing.    Gait with Narrow Base of Support  Ambulates 7-9 steps.   with SPC; able to take 7 steps with SPC gently resting on floor   Gait with Eyes Closed  Walks 20 ft, slow speed, abnormal gait pattern, evidence for imbalance, deviates 10-15 in outside  12 in walkway width. Requires more than 9 sec to ambulate 20 ft.    Ambulating Backwards  Walks 20 ft, uses assistive device, slower speed, mild gait deviations, deviates 6-10 in outside 12 in walkway width.    Steps  Alternating feet, must use rail.    Total Score  20    FGA comment:  19-24 = medium risk fall   last measured 07/08/2019, used SPC and SBA-CGA for safety      TREATMENT:  Therapeutic exercise:to centralize symptoms and improve ROM, strength, muscular endurance, and activity tolerance required for successful completion of functional activities. - Time to fill out FOTO (unbilled time x 5 minutes).  Functional testing to assess progress and improve functional endurance, strength, balance, and power to improve ability to complete ADLs. Required SBA- CGA for safety. Required cuing for directions and encouragement to continue. - 6MWT:672fet SPC, 3standing breaks (pain in L 1st MTP - 5TSTS:16seconds, no UE support from 18.5 inch plinth.  - 10MWT =0.747mwith SPC - FGA:20/30 -Education on diagnosis, prognosis, POC, anatomy and physiology of current condition.  education and discussion of patient's goals and future focus of physical therapy.   Patient required rest breaks between functional activities due to [place] and redirection to stay on task due to easy distraction. Required SBA for standing activities due to fall risk.   HOME EXERCISE PROGRAM (pt has 2# DB at home) Access Code: [email or web address]  URL: [email or web address]  Date: 05/18/2019  Prepared by: Rosita Kea   Exercises  Walking - 1.5 to the mailbox and back - 1.5 2 minutes in the house - 1x daily - 7x weekly  Leg stretch - 10 reps - 3 seconds hold - 2x daily - 7x weekly  Seated Shoulder Flexion with Dumbbells - 2 sets - 5 reps - 1 second hold - 1x daily - 7x weekly  Seated Shoulder Abduction with Dumbbells - Thumbs Up - 2 sets - 5 reps - 1 second hold - 1x daily - 7x weekly  Seated  Shoulder Horizontal Abduction with Resistance - 2 sets - 10 reps - 1 second hold - 1x daily - 7x weekly   PT Education - 07/08/19 1730    Education provided  Yes    Education Details  Education on diagnosis, prognosis, POC, anatomy and physiology of current condition.  education and discussion of patient's goals and future focus of physical therapy.    Person(s) Educated  Patient    Methods  Explanation;Demonstration;Tactile cues;Verbal cues    Comprehension  Verbalized understanding;Returned demonstration;Verbal cues required;Tactile cues required;Need further instruction       PT Short Term Goals - 05/31/19 2104      PT SHORT TERM GOAL #1   Title  Be independent with initial home exercise program for self-management of symptoms.    Baseline  Initial HEP provided at IE (11/17/2018); was completing until vascular procedure, re-instated 12/15/2018 (12/15/2018); currently participating (01/26/2019); Partially doing HEP due the patient's recent health condition/weakness(03/23/2019); Patient has been doing her HEP every other day. 04/20/2019; has returned to completing her walking HEP as prescribed (05/31/2019);    Time  2    Period  Weeks    Status  Achieved    Target Date  05/04/19        PT Long Term Goals - 07/08/19 1723      PT LONG TERM GOAL #1   Title  Be independent with a long-term home exercise program for self-management of symptoms.     Baseline  Initial HEP provided at IE (11/17/2018); was doing better before vascular surgery, is working at returning to [facility] since procedure (12/15/2018); currently participating but not assigned final long term HEP yet (01/26/2019); currently participating but not assigned final long term HEP yet (03/23/2019); HEP progressing (04/20/2019; 05/31/2019; 07/08/2019);    Time  12    Period  Weeks    Status  Partially Met    Target Date  08/23/19      PT LONG TERM GOAL #2   Title  Patient will improve 6MWT to equal or greater than 1000 feet with LRAD to  demonstrate improved activity tolerance for community ambulation.     Baseline  6 Minute Walk Test: 200 feet with SPC and supervision. Stopped at 1:43 due to fatigue and leg pain. (11/17/2018); 457 feet SPC, on seated and one standing break (12/15/2018); 547 with SPC, 2 seated and one standing rest break (01/26/2019); 468 feet with SPC, 2 seated breaks(03/23/2019); 638 feet with SPC, 2 seated breaks; 747 feet with SPC and 2 standing rest breaks (05/31/2019); 628 feet with SPC and 3  standing rest breaks (07/08/2019);    Time  12    Period  Weeks    Status  Partially Met    Target Date  08/23/19      PT LONG TERM GOAL #3   Title  Patient will demonstrate improved 5 Times Sit to Stand test to 11 seconds from chair height with no UE support to demonstrate improvement in LE power and functional strength for daily activities.    Baseline  5 Time Sit to Stand: 27 seconds from chair height no UE support (11/17/2018); 18 seconds, no UE support from chair-high plinth (12/15/2018); 16 seconds with no UE support from chair (01/26/2019); 17.45s with lack of knee extension in standing(03/23/2019); 14.03s without BUEs help (04/20/2019); 16 seconds without BUE help from 18.5 inch seat (05/31/2019); 16 seconds, no UE support from 18.5 inch plinth (07/08/2019);    Time  12    Period  Weeks    Status  Partially Met      PT LONG TERM GOAL #4   Title  Patient will ambulate faster than 1.23ms on the 10 Meter Walk Test to improve ability to cross street safey for community participation(05/21/2019); 235m with SPC (05/31/2019);    Baseline  testing deferred to next session (11/17/2018); 1.37 m/sec with SPC (11/24/2018); 1.3 m/sec with SPC (12/15/2018; 01/26/2019); 0.4728m(03/23/2019); 0.98 m/sec with SPC (04/20/2019); 1 m/sec (05/31/2019); 0.75 m/sec (07/08/2019);    Time  12    Period  Weeks    Status  Partially Met    Target Date  08/23/19      PT LONG TERM GOAL #5   Title  Patient will score equal or greater than  25/30 on the  Functional Gait Assessment to demonstrate low fall risk to improve her ability to participate safely in community activities.     Baseline  testing deferred to next session (11/17/2018); 11/30 high fall risk (11/24/2018); unable to measure due to time limitation (12/15/2018); 13/30 high fall risk (12/17/2018); 14/30 high fall risk (01/26/2019); 14/30 high fall risk(03/23/2019); 17/30 high fall risk (04/20/2019); 21/30 moderate fall risk (05/31/2019); 20/30 moderate fall risk (07/08/2019);    Time  12    Period  Weeks    Status  Partially Met    Target Date  08/23/19      PT LONG TERM GOAL #6   Title  Reduce pain to equal or less than 4/10 with functional activities to allow patient to complete valued functional tasks such as caring for dogs, grocery shopping, walking with less difficulty.(03/23/2019);    Baseline  8/10 (01/26/2019); 6/10 (03/23/2019); 4/10 (04/20/2019); 4/10 (05/31/2019); 5/10 (07/08/2019);    Time  12    Period  Weeks    Status  Partially Met    Target Date  08/23/19            Plan - 07/08/19 1730    Clinical Impression Statement  Patient has attended 50 74illed physical therapy treatment sessions this episode of care and overall presents with good progress towards stated goals since initial evaluation. Has not made progress in objective measures over the last 6 weeks, but has shown more stability in her improvements overall and reports improved strength and decreased pain. Patient reports she continues to be concerned about her balance and does not feel she has learned adequate balance exercises for confident independent management of her condition. She would like to focus on development of a strong HEP in line with her personal goals over the next 10 visits. Objectively,  since initial eval patient demonstrates good progression in walking distance (6MWT), walking speed (10MWT), and balance (FGA). Patient continues to learn and improve skills for self-care strategies for pain control  and increased activity tolerance.  Patient continues to report pain and weakness in her low back and right knee and presents with significant strength, ROM, balance, pain, activity tolerance, moderate  fall risk impairments that are limiting ability to complete her usual ADLs, IADLs, community participation, care for her dogs, manage her home, bend, lift, ambulate community distances and complete transfers without difficulty or falling. Patient will benefit from continued skilled physical therapy intervention to address current body structure impairments and activity limitations to improve function and work towards goals set in current POC in order to return to prior level of function or maximal functional improvement.    Personal Factors and Comorbidities  Age;Comorbidity 3+;Fitness;Time since onset of injury/illness/exacerbation;Past/Current Experience;Other    Comorbidities  underwent extensive vascular surgeries in Jan 2020 to improve blood flow, similar surgery for R leg scheduled 12/07/2018. Has a history of left carotid artery stenosis, but not enough to have surgery, has a history of multiple arterial surgeries, history of R cataract surgery, colon surgery, cholecystectomy, R foot skin graft, ovary surgery, gastric bypass, R foot fusion, abdominal hysterectomy; current smoker, plantar fasciitis, peripheral arterial disease, LVH, diabetes mellitus with neuropathy, irregular heartbeat, hiatal hernia, GERD, stenosis of left heart ventricle, depression, lumbar degenerative disc disease, cardiac murmur, asthma, anxiety, obesity, HTN, and seasonal allergies She has been advised not to lift over 10 pounds by physician.      Examination-Activity Limitations  Bathing;Carry;Lift;Sit;Stand;Locomotion Level;Toileting;Dressing;Squat;Transfers;Stairs;Hygiene/Grooming;Other    Examination-Participation Restrictions  Community Activity;Volunteer;Cleaning;Interpersonal Relationship;Yard Work;Other    Rehab Potential  Fair     Clinical Impairments Affecting Rehab Potential  (+) motivated (-) muliple co morbidities, chronic condition    PT Frequency  2x / week    PT Duration  12 weeks    PT Treatment/Interventions  Moist Heat;Patient/family education;Neuromuscular re-education;Therapeutic exercise;Manual techniques;ADLs/Self Care Home Management;Electrical Stimulation;Gait training;Stair training;Functional mobility training;Therapeutic activities;Balance training;Passive range of motion;Dry needling;Energy conservation;Spinal Manipulations;Joint Manipulations;Cryotherapy    PT Next Visit Plan  strengthening, pain control, and promotion of self-management techniques    PT Home Exercise Plan  Medbridge Access Code: [email or web address]    Consulted and Agree with Plan of Care  Patient       Patient will benefit from skilled therapeutic intervention in order to improve the following deficits and impairments:  Decreased strength, Impaired flexibility, Decreased activity tolerance, Impaired perceived functional ability, Pain, Decreased endurance, Difficulty walking, Abnormal gait, Decreased knowledge of use of DME, Decreased skin integrity, Decreased range of motion, Impaired sensation, Improper body mechanics, Obesity, Postural dysfunction, Increased edema, Decreased mobility, Decreased balance, Cardiopulmonary status limiting activity, Decreased coordination  Visit Diagnosis: Muscle weakness (generalized)  Chronic bilateral low back pain, unspecified whether sciatica present  Difficulty in walking, not elsewhere classified  History of falling     Problem List Patient Active Problem List   Diagnosis Date Noted  . Tobacco use disorder 06/15/2019  . Osteopenia 03/03/2019  . Lower extremity edema 07/31/2018  . Pressure injury of skin 07/24/2018  . Surgical site infection 07/23/2018  . Wound of left leg 07/23/2018  . Atherosclerosis of artery of extremity with rest pain ([place]) 07/08/2018  . Shortness of breath 06/02/2018   . Bruit 06/02/2018  . Coronary artery disease of native artery of native heart with stable angina pectoris ([place]) 06/02/2018  . Atherosclerosis of native arteries of extremity with intermittent claudication (  [place]) 05/19/2018  . Peripheral arterial disease ([place]) 05/06/2018  . Personal history of colonic polyps   . Aortic stenosis 12/24/2017  . LVH (left ventricular hypertrophy) 12/24/2017  . Hypertension with heart disease 12/24/2017  . Left atrial dilatation 12/12/2017  . Cardiac murmur 12/12/2017  . Gastrojejunal ulcer 12/04/2017  . Hyperphosphatemia 12/04/2017  . Spinal stenosis of lumbar region 06/18/2017  . Degeneration of lumbar intervertebral disc 06/02/2017  . Assistance needed with transportation 05/26/2017  . Financial difficulties 05/26/2017  . Needs assistance with community resources 05/26/2017  . Moderate recurrent major depression ([place]) 05/26/2017  . Non-healing wound of lower extremity 02/06/2017  . Status post ankle arthrodesis 12/11/2016  . Controlled substance agreement broken 10/11/2016  . Low back pain 09/25/2016  . Osteoarthritis of right subtalar joint 07/11/2016  . Posterior tibial tendinitis of right lower extremity 07/11/2016  . Breast cancer screening 06/18/2016  . Knee pain 04/03/2016  . Hyponatremia 03/01/2016  . High triglycerides 12/24/2015  . Pes planus of both feet 11/16/2015  . Plantar fasciitis of right foot 11/16/2015  . Heel spur 11/16/2015  . Diabetic neuropathy ([place]) 11/16/2015  . Right ankle pain 11/16/2015  . Medication monitoring encounter 11/16/2015  . Chronic pain of multiple joints 08/15/2015  . Abnormal mammogram of right breast 06/19/2015  . Cerumen impaction 03/16/2015  . Hyperlipidemia 12/14/2014  . History of transfusion of packed RBC 12/14/2014  . Hx of smoking 12/14/2014  . Status post bariatric surgery 12/14/2014  . Morbid obesity ([place]) 12/14/2014  . Chronic radicular lumbar pain 12/14/2014  . History of GI bleed  11/12/2014  . History of small bowel obstruction 09/07/2014    Everlean Alstrom. [name], PT, DPT 07/08/19, 5:31 PM  [facility] [address] [place], [address] Phone: [phone]   Fax:  [phone]  Name: Andrea Holden MRN: [id number] Date of Birth: 04/08/1946

## 2019-07-13 ENCOUNTER — Ambulatory Visit: Payer: Medicare Other | Admitting: Physical Therapy

## 2019-07-15 ENCOUNTER — Encounter: Payer: Self-pay | Admitting: Physical Therapy

## 2019-07-15 ENCOUNTER — Ambulatory Visit: Payer: Medicare Other | Admitting: Physical Therapy

## 2019-07-15 ENCOUNTER — Other Ambulatory Visit: Payer: Self-pay

## 2019-07-15 DIAGNOSIS — M6281 Muscle weakness (generalized): Secondary | ICD-10-CM

## 2019-07-15 DIAGNOSIS — Z9181 History of falling: Secondary | ICD-10-CM

## 2019-07-15 DIAGNOSIS — G8929 Other chronic pain: Secondary | ICD-10-CM

## 2019-07-15 DIAGNOSIS — R262 Difficulty in walking, not elsewhere classified: Secondary | ICD-10-CM

## 2019-07-15 NOTE — Therapy (Signed)
[facility] [address], [place], [address] Phone: [phone]   Fax:  [phone]  Physical Therapy Treatment  Patient Details  Name: Andrea Holden MRN: [id number] Date of Birth: Sep 07, 1945 Referring Provider (PT): Fredderick Severance, NP    Encounter Date: 07/15/2019  PT End of Session - 07/15/19 1132    Visit Number  51    Number of Visits  65    Date for PT Re-Evaluation  08/23/19    Authorization Type  [facility] Medicare reporting period from 05/31/2019    Authorization - Visit Number  1    Authorization - Number of Visits  10    PT Start Time  1125    PT Stop Time  1200    PT Time Calculation (min)  35 min    Equipment Utilized During Treatment  Gait belt    Activity Tolerance  Patient limited by pain;Patient tolerated treatment well;Patient limited by fatigue    Behavior During Therapy  [facility] for tasks assessed/performed   arrived 10 minutes late      Past Medical History:  Diagnosis Date  . Allergy    seasonal allergies  . Anemia 2014   needed 5 units of blood d/t passing out, weak  . Anxiety   . Aortic stenosis 12/24/2017   Echo Aug 2018  . Arthritis   . Asthma    allergy induced asthma  . Cardiac murmur 12/12/2017  . Cataract   . Cataract    left  . Complication of anesthesia    arrhythmia following colonoscopy  . Degenerative disc disease, lumbar   . Degenerative disc disease, lumbar   . Depression   . Diabetes mellitus   . Diabetic neuropathy ([place]) 11/16/2015  . Dysrhythmia    stenosis of left ventricle  . GERD (gastroesophageal reflux disease)   . H/O transfusion    patient was given 5 units of blood while at [place], blood type O+  . History of chicken pox   . History of hiatal hernia   . History of measles, mumps, or rubella   . HOH (hard of hearing)    does not use hearing aides yet  . Hyperlipidemia   . Hypertension   . Irregular heartbeat   . LVH (left ventricular  hypertrophy) 12/24/2017   Echo Aug 2018  . Neuropathy   . Opiate use 11/16/2015  . Peripheral arterial disease ([place]) 05/06/2018   At rest, left > right; refer to vasc  . Pes planus of both feet 11/16/2015  . Plantar fasciitis of right foot 11/16/2015  . Wheezing     Past Surgical History:  Procedure Laterality Date  . ABDOMINAL HYSTERECTOMY    . APPLICATION OF WOUND VAC Left 07/23/2018   Procedure: APPLICATION OF WOUND VAC;  Surgeon: Algernon Huxley, MD;  Location: [facility] ORS;  Service: Vascular;  Laterality: Left;  . banck injections    . CATARACT EXTRACTION W/PHACO Right 05/30/2015   Procedure: CATARACT EXTRACTION PHACO AND INTRAOCULAR LENS PLACEMENT (IOC);  Surgeon: Birder Robson, MD;  Location: [facility] ORS;  Service: Ophthalmology;  Laterality: Right;  [place] 00:57 AP% 20.9 CDE 11.99 fluid pack lot #[id number]  . CHOLECYSTECTOMY  1970  . COLON SURGERY  2013   blocked colon  . COLONOSCOPY WITH PROPOFOL N/A 01/20/2018   Procedure: COLONOSCOPY WITH PROPOFOL;  Surgeon: Lucilla Lame, MD;  Location: [facility] ENDOSCOPY;  Service: Endoscopy;  Laterality: N/A;  . ENDARTERECTOMY FEMORAL Left 07/08/2018  Procedure: ENDARTERECTOMY FEMORAL;  Surgeon: Algernon Huxley, MD;  Location: [facility] ORS;  Service: Vascular;  Laterality: Left;  . EYE SURGERY Right    cataract surgery  . FOOT FUSION Right 2018   metal in foot  . GALLBLADDER SURGERY  1970  . GASTRIC BYPASS  2010   lost 178 lbs and regained 40 lbs last few years  . HUMERUS FRACTURE SURGERY Right    metal plate with screws  . internal bleeding  2016   ulcer in past  . LOWER EXTREMITY ANGIOGRAM Left 07/08/2018   Procedure: LOWER EXTREMITY ANGIOGRAM;  Surgeon: Algernon Huxley, MD;  Location: [facility] ORS;  Service: Vascular;  Laterality: Left;  . LOWER EXTREMITY ANGIOGRAPHY Left 05/27/2018   Procedure: LOWER EXTREMITY ANGIOGRAPHY;  Surgeon: Algernon Huxley, MD;  Location: [place] CV LAB;  Service: Cardiovascular;  Laterality: Left;  . LOWER EXTREMITY ANGIOGRAPHY  Right 12/07/2018   Procedure: LOWER EXTREMITY ANGIOGRAPHY;  Surgeon: Algernon Huxley, MD;  Location: [place] CV LAB;  Service: Cardiovascular;  Laterality: Right;  . LOWER EXTREMITY ANGIOGRAPHY Left 05/10/2019   Procedure: LOWER EXTREMITY ANGIOGRAPHY;  Surgeon: Algernon Huxley, MD;  Location: [place] CV LAB;  Service: Cardiovascular;  Laterality: Left;  . MOUTH SURGERY     root canals and crowns and extractions  . OVARY SURGERY    . SKIN GRAFT Right 2018   RT foot. foot has been rebuilt.  it is full of metal  . TENOTOMY ACHILLES TENDON Right    Percuntaneous. metal in foot  . TONSILLECTOMY    . WOUND DEBRIDEMENT Left 07/23/2018   Procedure: DEBRIDEMENT WOUND;  Surgeon: Algernon Huxley, MD;  Location: [facility] ORS;  Service: Vascular;  Laterality: Left;    There were no vitals filed for this visit.  Subjective Assessment - 07/15/19 1127    Subjective  Patient reports she missed her last treatment session because she locked her keys in the car that day. Today she reports pain in her R heel 4.5/10 that she attributes to her heel spurs. States pain other places is still difficulty and she is still forcing herself to stand straight but says she is managing that okay right now. She is having difficulty walking around her house because of R thigh pain that requires her to stop-start a lot because of pain. It feels like she needs another stent in her leg but she was just checked for that and it was all working fine. She states she is still getting her walking laps in but not as high quality as before.    Pertinent History  Patient is a 74 y.o. female who presents to outpatient physical therapy with a referral for medical diagnosis of degeneration of lumbar intervertebral disc. This patient's chief complaints consist of chronic low back pain and foot pain, weakness, stiffness, leading to the following functional deficits: difficulty with with basic ADLs, IADLs, ambulation. She used to get injections to help  control back pain but it has recently worsened due to being unable to get injections during [place] pandemic. The patient has been informed of current processes in place at [facility]patient Rehab to protect patients from Covid-19 exposure including social distancing, schedule modifications, and new cleaning procedures. After discussing their particular risk with a therapist based on the patient's personal risk factors, the patient has decided to proceed with in-person therapy. Pt is scheduled for R LE angiography on 12/07/2018. She has recently started smoking again with the stress of COVID19 pandemic and feels strongly  she needs to stop. Surgeon has stated in the past he will not complete surgery if she is smoking.  Relevant past medical history and comorbidities include underwent extensive vascular surgeries in Jan 2020 to improve blood flow, similar surgery for R leg scheduled 12/07/2018. Has a history of left carotid artery stenosis, but not enough to have surgery, has a history of multiple arterial surgeries, history of R cataract surgery, colon surgery, cholecystectomy, R foot skin graft, ovary surgery, gastric bypass, R foot fusion, abdominal hysterectomy; current smoker, plantar fasciitis, peripheral arterial disease, LVH, diabetes mellitus with neuropathy, irregular heartbeat, hiatal hernia, GERD, stenosis of left heart ventricle, depression, lumbar degenerative disc disease, cardiac murmur, asthma, anxiety, obesity, HTN, and seasonal allergies She has been advised not to lift over 10 pounds by physician    Limitations  Sitting;Standing;Walking;House hold activities;Lifting    How long can you sit comfortably?  > 1 hour in almost any chair    How long can you stand comfortably?  10-15 min while cooking    How long can you walk comfortably?  2 times to the mailbox and back (120 feet to mailbox) with single point cane. Can ambulate 1 hour with rollator with seated resting breaks.    Diagnostic tests  MRI lumbar  spine report 06/14/2017: "IMPRESSION: 1. Stable degenerative of lumbar spondylosis and scoliosis with multilevel disc disease and facet disease. 2. Stable right foraminal stenosis at L5-S1 due to right-sided disc    Patient Stated Goals  patient would like to get back to PLOF, walking further, be better able to participate in dog showing and trips    Currently in Pain?  Yes    Pain Score  5     Pain Location  Heel    Pain Orientation  Right    Pain Onset  More than a month ago       TREATMENT  Patient goal until next session: 2 laps walking around outside of home each day.  Therapeutic exercise:to centralize symptoms and improve ROM, strength, muscular endurance, and activity tolerance required for successful completion of functional activities.  LargeNuSteplevel1-2 usingBLEs.Seatsetting 8. For improved extremity mobility, muscular endurance, and activity tolerance; and to induce the analgesic effect of aerobic exercise, stimulate improved joint nutrition, and prepare body structures and systems for following interventions.during subjective exam. Average SPM =73.x10mn during subjective exam.   Sit <> stand from22inchseatwith no UE support, cuing for glute contraction.2x15.R knee painful.   Standing exercises with trunk/legs unsupported and no UE support to improve core strength, standing balance, and standing tolerance:  - B shoulder flexion,x10(3#) - B shoulder abduction, x10 (3#) - B horizontal shoulder abduction, yellow theraband loop,x15 - pallof press,redtheraband loop, x15 each side - standing marching x 1 min - standing row x 30 with green theraband   Seated with no trunk support to improve core strength:  - hip abduction with knee extended and foot off floor. 2x10 each side. Patient likes this exercise and reports stretch through groin L>R.   Cuing for improved effort, purpose of exercise, translation to personal functional  goals.Encouragement to continue HEP. Required extended rests and cuing to stay on task throughout session.   HOME EXERCISE PROGRAM (pt has 2# DB at home) Access Code: [email or web address] URL: [email or web address]  Date: 05/18/2019  Prepared by: SRosita Kea  Exercises  Walking - 1.5 to the mailbox and back - 1.5 2 minutes in the house - 1x daily - 7x weekly  Leg stretch - 10 reps - 3 seconds  hold - 2x daily - 7x weekly  Seated Shoulder Flexion with Dumbbells - 2 sets - 5 reps - 1 second hold - 1x daily - 7x weekly  Seated Shoulder Abduction with Dumbbells - Thumbs Up - 2 sets - 5 reps - 1 second hold - 1x daily - 7x weekly  Seated Shoulder Horizontal Abduction with Resistance - 2 sets - 10 reps - 1 second hold - 1x daily - 7x weekly    PT Education - 07/15/19 1353    Education provided  Yes    Education Details  Exercise purpose/form. Self management techniques.    Person(s) Educated  Patient    Methods  Explanation;Demonstration;Tactile cues;Verbal cues    Comprehension  Verbalized understanding;Returned demonstration;Verbal cues required;Tactile cues required;Need further instruction       PT Short Term Goals - 05/31/19 2104      PT SHORT TERM GOAL #1   Title  Be independent with initial home exercise program for self-management of symptoms.    Baseline  Initial HEP provided at IE (11/17/2018); was completing until vascular procedure, re-instated 12/15/2018 (12/15/2018); currently participating (01/26/2019); Partially doing HEP due the patient's recent health condition/weakness(03/23/2019); Patient has been doing her HEP every other day. 04/20/2019; has returned to completing her walking HEP as prescribed (05/31/2019);    Time  2    Period  Weeks    Status  Achieved    Target Date  05/04/19        PT Long Term Goals - 07/08/19 1723      PT LONG TERM GOAL #1   Title  Be independent with a long-term home exercise program for self-management of symptoms.      Baseline  Initial HEP provided at IE (11/17/2018); was doing better before vascular surgery, is working at returning to [facility] since procedure (12/15/2018); currently participating but not assigned final long term HEP yet (01/26/2019); currently participating but not assigned final long term HEP yet (03/23/2019); HEP progressing (04/20/2019; 05/31/2019; 07/08/2019);    Time  12    Period  Weeks    Status  Partially Met    Target Date  08/23/19      PT LONG TERM GOAL #2   Title  Patient will improve 6MWT to equal or greater than 1000 feet with LRAD to demonstrate improved activity tolerance for community ambulation.     Baseline  6 Minute Walk Test: 200 feet with SPC and supervision. Stopped at 1:43 due to fatigue and leg pain. (11/17/2018); 457 feet SPC, on seated and one standing break (12/15/2018); 547 with SPC, 2 seated and one standing rest break (01/26/2019); 468 feet with SPC, 2 seated breaks(03/23/2019); 638 feet with SPC, 2 seated breaks; 747 feet with SPC and 2 standing rest breaks (05/31/2019); 628 feet with SPC and 3 standing rest breaks (07/08/2019);    Time  12    Period  Weeks    Status  Partially Met    Target Date  08/23/19      PT LONG TERM GOAL #3   Title  Patient will demonstrate improved 5 Times Sit to Stand test to 11 seconds from chair height with no UE support to demonstrate improvement in LE power and functional strength for daily activities.    Baseline  5 Time Sit to Stand: 27 seconds from chair height no UE support (11/17/2018); 18 seconds, no UE support from chair-high plinth (12/15/2018); 16 seconds with no UE support from chair (01/26/2019); 17.45s with lack of knee extension in standing(03/23/2019);  14.03s without BUEs help (04/20/2019); 16 seconds without BUE help from 18.5 inch seat (05/31/2019); 16 seconds, no UE support from 18.5 inch plinth (07/08/2019);    Time  12    Period  Weeks    Status  Partially Met      PT LONG TERM GOAL #4   Title  Patient will ambulate faster than  1.42ms on the 10 Meter Walk Test to improve ability to cross street safey for community participation(05/21/2019); 286m with SPC (05/31/2019);    Baseline  testing deferred to next session (11/17/2018); 1.37 m/sec with SPC (11/24/2018); 1.3 m/sec with SPC (12/15/2018; 01/26/2019); 0.4740m(03/23/2019); 0.98 m/sec with SPC (04/20/2019); 1 m/sec (05/31/2019); 0.75 m/sec (07/08/2019);    Time  12    Period  Weeks    Status  Partially Met    Target Date  08/23/19      PT LONG TERM GOAL #5   Title  Patient will score equal or greater than  25/30 on the Functional Gait Assessment to demonstrate low fall risk to improve her ability to participate safely in community activities.     Baseline  testing deferred to next session (11/17/2018); 11/30 high fall risk (11/24/2018); unable to measure due to time limitation (12/15/2018); 13/30 high fall risk (12/17/2018); 14/30 high fall risk (01/26/2019); 14/30 high fall risk(03/23/2019); 17/30 high fall risk (04/20/2019); 21/30 moderate fall risk (05/31/2019); 20/30 moderate fall risk (07/08/2019);    Time  12    Period  Weeks    Status  Partially Met    Target Date  08/23/19      PT LONG TERM GOAL #6   Title  Reduce pain to equal or less than 4/10 with functional activities to allow patient to complete valued functional tasks such as caring for dogs, grocery shopping, walking with less difficulty.(03/23/2019);    Baseline  8/10 (01/26/2019); 6/10 (03/23/2019); 4/10 (04/20/2019); 4/10 (05/31/2019); 5/10 (07/08/2019);    Time  12    Period  Weeks    Status  Partially Met    Target Date  08/23/19            Plan - 07/15/19 1357    Clinical Impression Statement  Patent tolerated treatment well overall and was able to progress to more standing exercises despite some discomfort in the R heel and legs. Requred seated rests after every two sets of each standing exercise. Demonstrates very poor strength in L hip and is unable to perform L SLS without R hip dropping  uncontrolled, making it very difficult for her to lift R foot and negatively affects balance. Patient would benefit from continued management of limiting condition by skilled physical therapist to address remaining impairments and functional limitations to work towards stated goals and return to PLOF or maximal functional independence.    Personal Factors and Comorbidities  Age;Comorbidity 3+;Fitness;Time since onset of injury/illness/exacerbation;Past/Current Experience;Other    Comorbidities  underwent extensive vascular surgeries in Jan 2020 to improve blood flow, similar surgery for R leg scheduled 12/07/2018. Has a history of left carotid artery stenosis, but not enough to have surgery, has a history of multiple arterial surgeries, history of R cataract surgery, colon surgery, cholecystectomy, R foot skin graft, ovary surgery, gastric bypass, R foot fusion, abdominal hysterectomy; current smoker, plantar fasciitis, peripheral arterial disease, LVH, diabetes mellitus with neuropathy, irregular heartbeat, hiatal hernia, GERD, stenosis of left heart ventricle, depression, lumbar degenerative disc disease, cardiac murmur, asthma, anxiety, obesity, HTN, and seasonal allergies She has been advised not to lift  over 10 pounds by physician.      Examination-Activity Limitations  Bathing;Carry;Lift;Sit;Stand;Locomotion Level;Toileting;Dressing;Squat;Transfers;Stairs;Hygiene/Grooming;Other    Examination-Participation Restrictions  Community Activity;Volunteer;Cleaning;Interpersonal Relationship;Yard Work;Other    Rehab Potential  Fair    Clinical Impairments Affecting Rehab Potential  (+) motivated (-) muliple co morbidities, chronic condition    PT Frequency  2x / week    PT Duration  12 weeks    PT Treatment/Interventions  Moist Heat;Patient/family education;Neuromuscular re-education;Therapeutic exercise;Manual techniques;ADLs/Self Care Home Management;Electrical Stimulation;Gait training;Stair  training;Functional mobility training;Therapeutic activities;Balance training;Passive range of motion;Dry needling;Energy conservation;Spinal Manipulations;Joint Manipulations;Cryotherapy    PT Next Visit Plan  strengthening, pain control, and promotion of self-management techniques    PT Home Exercise Plan  Medbridge Access Code: [email or web address]    Consulted and Agree with Plan of Care  Patient       Patient will benefit from skilled therapeutic intervention in order to improve the following deficits and impairments:  Decreased strength, Impaired flexibility, Decreased activity tolerance, Impaired perceived functional ability, Pain, Decreased endurance, Difficulty walking, Abnormal gait, Decreased knowledge of use of DME, Decreased skin integrity, Decreased range of motion, Impaired sensation, Improper body mechanics, Obesity, Postural dysfunction, Increased edema, Decreased mobility, Decreased balance, Cardiopulmonary status limiting activity, Decreased coordination  Visit Diagnosis: Muscle weakness (generalized)  Chronic bilateral low back pain, unspecified whether sciatica present  Difficulty in walking, not elsewhere classified  History of falling     Problem List Patient Active Problem List   Diagnosis Date Noted  . Tobacco use disorder 06/15/2019  . Osteopenia 03/03/2019  . Lower extremity edema 07/31/2018  . Pressure injury of skin 07/24/2018  . Surgical site infection 07/23/2018  . Wound of left leg 07/23/2018  . Atherosclerosis of artery of extremity with rest pain ([place]) 07/08/2018  . Shortness of breath 06/02/2018  . Bruit 06/02/2018  . 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 Atherosclerosis of native arteries of extremity with intermittent claudication ([place]) 05/19/2018  . Peripheral arterial disease ([place]) 05/06/2018  . Personal history of colonic polyps   . Aortic stenosis 12/24/2017  . LVH (left ventricular  hypertrophy) 12/24/2017  . Hypertension with heart disease 12/24/2017  . Left atrial dilatation 12/12/2017  . Cardiac murmur 12/12/2017  . Gastrojejunal ulcer 12/04/2017  . Hyperphosphatemia 12/04/2017  . Spinal stenosis of lumbar region 06/18/2017  . Degeneration of lumbar intervertebral disc 06/02/2017  . Assistance needed with transportation 05/26/2017  . Financial difficulties 05/26/2017  . Needs assistance with community resources 05/26/2017  . Moderate recurrent major depression ([place]) 05/26/2017  . Non-healing wound of lower extremity 02/06/2017  . Status post ankle arthrodesis 12/11/2016  . Controlled substance agreement broken 10/11/2016  . Low back pain 09/25/2016  . Osteoarthritis of right subtalar joint 07/11/2016  . Posterior tibial tendinitis of right lower extremity 07/11/2016  . Breast cancer screening 06/18/2016  . Knee pain 04/03/2016  . Hyponatremia 03/01/2016  . High triglycerides 12/24/2015  . Pes planus of both feet 11/16/2015  . Plantar fasciitis of right foot 11/16/2015  . Heel spur 11/16/2015  . Diabetic neuropathy ([place]) 11/16/2015  . Right ankle pain 11/16/2015  . Medication monitoring encounter 11/16/2015  . Chronic pain of multiple joints 08/15/2015  . Abnormal mammogram of right breast 06/19/2015  . Cerumen impaction 03/16/2015  . Hyperlipidemia 12/14/2014  . History of transfusion of packed RBC 12/14/2014  . Hx of smoking 12/14/2014  . Status post bariatric surgery 12/14/2014  . Morbid obesity ([place]) 12/14/2014  . Chronic  radicular lumbar pain 12/14/2014  . History of GI bleed 11/12/2014  . History of small bowel obstruction 09/07/2014    Everlean Alstrom. [name], PT, DPT 07/15/19, 1:58 PM  [facility] [address] [place], [address] Phone: [phone]   Fax:  [phone]  Name: HEATHER MCKENDREE MRN: [id number] Date of Birth: 1946-03-17

## 2019-07-19 ENCOUNTER — Ambulatory Visit: Payer: Medicare Other | Admitting: Physical Therapy

## 2019-07-19 ENCOUNTER — Encounter: Payer: Medicare Other | Admitting: Physical Therapy

## 2019-07-21 ENCOUNTER — Other Ambulatory Visit: Payer: Self-pay

## 2019-07-21 ENCOUNTER — Ambulatory Visit: Payer: Medicare Other | Admitting: Physical Therapy

## 2019-07-21 ENCOUNTER — Encounter: Payer: Self-pay | Admitting: Physical Therapy

## 2019-07-21 VITALS — BP 110/70

## 2019-07-21 DIAGNOSIS — Z9181 History of falling: Secondary | ICD-10-CM

## 2019-07-21 DIAGNOSIS — M545 Low back pain, unspecified: Secondary | ICD-10-CM

## 2019-07-21 DIAGNOSIS — M6281 Muscle weakness (generalized): Secondary | ICD-10-CM | POA: Diagnosis not present

## 2019-07-21 DIAGNOSIS — R262 Difficulty in walking, not elsewhere classified: Secondary | ICD-10-CM

## 2019-07-21 DIAGNOSIS — G8929 Other chronic pain: Secondary | ICD-10-CM

## 2019-07-21 NOTE — Therapy (Signed)
[facility] [address] [place], [address] Phone: [phone]   Fax:  [phone]  Physical Therapy Treatment  Patient Details  Name: Andrea Holden MRN: [id number] Date of Birth: 1946/03/01 Referring Provider (PT): Fredderick Severance, NP    Encounter Date: 07/21/2019  PT End of Session - 07/21/19 1450    Visit Number  52    Number of Visits  65    Date for PT Re-Evaluation  08/23/19    Authorization Type  [facility] Medicare reporting period from 07/15/2019    Authorization - Visit Number  2    Authorization - Number of Visits  10    PT Start Time  5188    PT Stop Time  1435    PT Time Calculation (min)  42 min    Equipment Utilized During Treatment  Gait belt    Activity Tolerance  Patient limited by pain;Patient tolerated treatment well;Patient limited by fatigue    Behavior During Therapy  [facility] for tasks assessed/performed   arrived 10 minutes late      Past Medical History:  Diagnosis Date  . Allergy    seasonal allergies  . Anemia 2014   needed 5 units of blood d/t passing out, weak  . Anxiety   . Aortic stenosis 12/24/2017   Echo Aug 2018  . Arthritis   . Asthma    allergy induced asthma  . Cardiac murmur 12/12/2017  . Cataract   . Cataract    left  . Complication of anesthesia    arrhythmia following colonoscopy  . Degenerative disc disease, lumbar   . Degenerative disc disease, lumbar   . Depression   . Diabetes mellitus   . Diabetic neuropathy ([place]) 11/16/2015  . Dysrhythmia    stenosis of left ventricle  . GERD (gastroesophageal reflux disease)   . H/O transfusion    patient was given 5 units of blood while at [place], blood type O+  . History of chicken pox   . History of hiatal hernia   . History of measles, mumps, or rubella   . HOH (hard of hearing)    does not use hearing aides yet  . Hyperlipidemia   . Hypertension   . Irregular heartbeat   . LVH (left ventricular  hypertrophy) 12/24/2017   Echo Aug 2018  . Neuropathy   . Opiate use 11/16/2015  . Peripheral arterial disease ([place]) 05/06/2018   At rest, left > right; refer to vasc  . Pes planus of both feet 11/16/2015  . Plantar fasciitis of right foot 11/16/2015  . Wheezing     Past Surgical History:  Procedure Laterality Date  . ABDOMINAL HYSTERECTOMY    . APPLICATION OF WOUND VAC Left 07/23/2018   Procedure: APPLICATION OF WOUND VAC;  Surgeon: Algernon Huxley, MD;  Location: [facility] ORS;  Service: Vascular;  Laterality: Left;  . banck injections    . CATARACT EXTRACTION W/PHACO Right 05/30/2015   Procedure: CATARACT EXTRACTION PHACO AND INTRAOCULAR LENS PLACEMENT (IOC);  Surgeon: Birder Robson, MD;  Location: [facility] ORS;  Service: Ophthalmology;  Laterality: Right;  [place] 00:57 AP% 20.9 CDE 11.99 fluid pack lot #[id number]  . CHOLECYSTECTOMY  1970  . COLON SURGERY  2013   blocked colon  . COLONOSCOPY WITH PROPOFOL N/A 01/20/2018   Procedure: COLONOSCOPY WITH PROPOFOL;  Surgeon: Lucilla Lame, MD;  Location: [facility];  Service: Endoscopy;  Laterality: N/A;  . ENDARTERECTOMY FEMORAL Left 07/08/2018  Procedure: ENDARTERECTOMY FEMORAL;  Surgeon: Algernon Huxley, MD;  Location: [facility] ORS;  Service: Vascular;  Laterality: Left;  . EYE SURGERY Right    cataract surgery  . FOOT FUSION Right 2018   metal in foot  . GALLBLADDER SURGERY  1970  . GASTRIC BYPASS  2010   lost 178 lbs and regained 40 lbs last few years  . HUMERUS FRACTURE SURGERY Right    metal plate with screws  . internal bleeding  2016   ulcer in past  . LOWER EXTREMITY ANGIOGRAM Left 07/08/2018   Procedure: LOWER EXTREMITY ANGIOGRAM;  Surgeon: Algernon Huxley, MD;  Location: [facility] ORS;  Service: Vascular;  Laterality: Left;  . LOWER EXTREMITY ANGIOGRAPHY Left 05/27/2018   Procedure: LOWER EXTREMITY ANGIOGRAPHY;  Surgeon: Algernon Huxley, MD;  Location: [place];  Service: Cardiovascular;  Laterality: Left;  . LOWER EXTREMITY ANGIOGRAPHY  Right 12/07/2018   Procedure: LOWER EXTREMITY ANGIOGRAPHY;  Surgeon: Algernon Huxley, MD;  Location: [place];  Service: Cardiovascular;  Laterality: Right;  . LOWER EXTREMITY ANGIOGRAPHY Left 05/10/2019   Procedure: LOWER EXTREMITY ANGIOGRAPHY;  Surgeon: Algernon Huxley, MD;  Location: [place] [facility];  Service: Cardiovascular;  Laterality: Left;  . MOUTH SURGERY     root canals and crowns and extractions  . OVARY SURGERY    . SKIN GRAFT Right 2018   RT foot. foot has been rebuilt.  it is full of metal  . TENOTOMY ACHILLES TENDON Right    Percuntaneous. metal in foot  . TONSILLECTOMY    . WOUND DEBRIDEMENT Left 07/23/2018   Procedure: DEBRIDEMENT WOUND;  Surgeon: Algernon Huxley, MD;  Location: [facility] ORS;  Service: Vascular;  Laterality: Left;    Vitals:   07/21/19 1405  BP: 110/70    Subjective Assessment - 07/21/19 1405    Subjective  Patient reports she has pain in her R knee upon arrival rated about 4.5/10 and a head ache 5.5/10 with some photosensitivity. She does not normally get head aches. She has been very busy this week up on her feet and a little less sleep than usual the past few days. She is feeling tense with the current political climate. She reports no excessive pain or soreness following last treatment session. Is frustrated trying to get signed up for COVID 19 vaccine. She called and left a message to get on a wait list for vaccination at [facility]    Pertinent History  Patient is a 74 y.o. female who presents to outpatient physical therapy with a referral for medical diagnosis of degeneration of lumbar intervertebral disc. This patient's chief complaints consist of chronic low back pain and foot pain, weakness, stiffness, leading to the following functional deficits: difficulty with with basic ADLs, IADLs, ambulation. She used to get injections to help control back pain but it has recently worsened due to being unable to get injections during [place]  pandemic. The patient has been informed of current processes in place at [facility]patient [facility] to protect patients from Covid-19 exposure including social distancing, schedule modifications, and new cleaning procedures. After discussing their particular risk with a therapist based on the patient's personal risk factors, the patient has decided to proceed with in-person therapy. Pt is scheduled for R LE angiography on 12/07/2018. She has recently started smoking again with the stress of COVID19 pandemic and feels strongly she needs to stop. Surgeon has stated in the past he will not complete surgery if she is smoking.  Relevant  past medical history and comorbidities include underwent extensive vascular surgeries in Jan 2020 to improve blood flow, similar surgery for R leg scheduled 12/07/2018. Has a history of left carotid artery stenosis, but not enough to have surgery, has a history of multiple arterial surgeries, history of R cataract surgery, colon surgery, cholecystectomy, R foot skin graft, ovary surgery, gastric bypass, R foot fusion, abdominal hysterectomy; current smoker, plantar fasciitis, peripheral arterial disease, LVH, diabetes mellitus with neuropathy, irregular heartbeat, hiatal hernia, GERD, stenosis of left heart ventricle, depression, lumbar degenerative disc disease, cardiac murmur, asthma, anxiety, obesity, HTN, and seasonal allergies She has been advised not to lift over 10 pounds by physician    Limitations  Sitting;Standing;Walking;House hold activities;Lifting    How long can you sit comfortably?  > 1 hour in almost any chair    How long can you stand comfortably?  10-15 min while cooking    How long can you walk comfortably?  2 times to the mailbox and back (120 feet to mailbox) with single point cane. Can ambulate 1 hour with rollator with seated resting breaks.    Diagnostic tests  MRI lumbar spine report 06/14/2017: "IMPRESSION: 1. Stable degenerative of lumbar spondylosis and scoliosis with  multilevel disc disease and facet disease. 2. Stable right foraminal stenosis at L5-S1 due to right-sided disc    Patient Stated Goals  patient would like to get back to PLOF, walking further, be better able to participate in dog showing and trips    Pain Score  5     Pain Location  Knee   and head ache   Pain Onset  More than a month ago         TREATMENT  Patient goal until next session: 2 laps walking around outside of home each day.  Therapeutic exercise:to centralize symptoms and improve ROM, strength, muscular endurance, and activity tolerance required for successful completion of functional activities.  LargeNuSteplevel1-2 usingBLEs.Seatsetting 8. For improved extremity mobility, muscular endurance, and activity tolerance; and to induce the analgesic effect of aerobic exercise, stimulate improved joint nutrition, and prepare body structures and systems for following interventions.during subjective exam. Average SPM =68.x56mn during subjective exam.  Sit <> stand from20.1inchseat (green chair with airex) with no UE support, cuing for glute contraction.2x5, 2x10.R knee painful.  Standing with touch down UE support at bar, balance exercises:  - narrow stance x 30 seconds.  - narrow stance with horizontal head rotations x10, x15 - narrow stance with nods x 10 - semi-tandem stance x 30 seconds each side.   Education on HEP and patient centered discussion of patient's goals to tailor HEP to what patient is familiar with and feels will fit in to her life. Completed during rest periods between sets of each exercise.   Cuing for improved effort, purpose of exercise, translation to personal functional goals.Encouragement to continue HEP. Required  cuing to stay on task throughout session.   HOME EXERCISE PROGRAM (pt has 2# DB at home)  Access Code: [email or web address] URL: [email or web address]  Date: 07/21/2019  Prepared by: SRosita Kea   Exercises Walking - 1.5 to the mailbox and back - 1.5 2 minutes in the house - 1x daily - 7x weekly Seated Shoulder Flexion with Dumbbells - 2 sets - 5 reps - 1 second hold - 1x daily - 7x weekly Seated Shoulder Abduction with Dumbbells - Thumbs Up - 2 sets - 5 reps - 1 second hold - 1x daily - 7x weekly Seated Shoulder Horizontal Abduction with  Resistance - 2 sets - 10 reps - 1 second hold - 1x daily - 7x weekly Sit to Stand without Arm Support - 2-3 sets - 10 reps - 1 every other day Sidelying Hip Abduction - 2-3 sets - 10-15 reps - 1 second hold - 1 every other day Bridge - 2-3 sets - 5-10 reps - 1-10 seconds hold - 1 Every Other Day Active Straight Leg Raise with Quad Set - 2-3 sets - 10-15 reps - 1 every other day Narrow Stance with Head Rotations and Counter Support - 2 sets - 10-15 reps - 1 every other day Narrow Stance with Head Nods and Counter Support - 2 sets - 10-15 reps - 1 every other day Standing Tandem Balance with Counter Support - 2 reps - 45 seconds hold - 1 every other day     PT Education - 07/21/19 1450    Education provided  Yes    Education Details  Exercise purpose/form. Self management techniques. HEP    Person(s) Educated  Patient    Methods  Explanation;Demonstration;Tactile cues;Verbal cues;Handout    Comprehension  Verbalized understanding;Returned demonstration;Verbal cues required;Tactile cues required;Need further instruction        PT Short Term Goals - 05/31/19 2104      PT SHORT TERM GOAL #1   Title  Be independent with initial home exercise program for self-management of symptoms.    Baseline  Initial HEP provided at IE (11/17/2018); was completing until vascular procedure, re-instated 12/15/2018 (12/15/2018); currently participating (01/26/2019); Partially doing HEP due the patient's recent health condition/weakness(03/23/2019); Patient has been doing her HEP every other day. 04/20/2019; has returned to completing her walking HEP as prescribed  (05/31/2019);    Time  2    Period  Weeks    Status  Achieved    Target Date  05/04/19        PT Long Term Goals - 07/08/19 1723      PT LONG TERM GOAL #1   Title  Be independent with a long-term home exercise program for self-management of symptoms.     Baseline  Initial HEP provided at IE (11/17/2018); was doing better before vascular surgery, is working at returning to [facility] since procedure (12/15/2018); currently participating but not assigned final long term HEP yet (01/26/2019); currently participating but not assigned final long term HEP yet (03/23/2019); HEP progressing (04/20/2019; 05/31/2019; 07/08/2019);    Time  12    Period  Weeks    Status  Partially Met    Target Date  08/23/19      PT LONG TERM GOAL #2   Title  Patient will improve 6MWT to equal or greater than 1000 feet with LRAD to demonstrate improved activity tolerance for community ambulation.     Baseline  6 Minute Walk Test: 200 feet with SPC and supervision. Stopped at 1:43 due to fatigue and leg pain. (11/17/2018); 457 feet SPC, on seated and one standing break (12/15/2018); 547 with SPC, 2 seated and one standing rest break (01/26/2019); 468 feet with SPC, 2 seated breaks(03/23/2019); 638 feet with SPC, 2 seated breaks; 747 feet with SPC and 2 standing rest breaks (05/31/2019); 628 feet with SPC and 3 standing rest breaks (07/08/2019);    Time  12    Period  Weeks    Status  Partially Met    Target Date  08/23/19      PT LONG TERM GOAL #3   Title  Patient will demonstrate improved 5 Times Sit to Stand  test to 11 seconds from chair height with no UE support to demonstrate improvement in LE power and functional strength for daily activities.    Baseline  5 Time Sit to Stand: 27 seconds from chair height no UE support (11/17/2018); 18 seconds, no UE support from chair-high plinth (12/15/2018); 16 seconds with no UE support from chair (01/26/2019); 17.45s with lack of knee extension in standing(03/23/2019); 14.03s without BUEs  help (04/20/2019); 16 seconds without BUE help from 18.5 inch seat (05/31/2019); 16 seconds, no UE support from 18.5 inch plinth (07/08/2019);    Time  12    Period  Weeks    Status  Partially Met      PT LONG TERM GOAL #4   Title  Patient will ambulate faster than 1.25ms on the 10 Meter Walk Test to improve ability to cross street safey for community participation(05/21/2019); 235m with SPC (05/31/2019);    Baseline  testing deferred to next session (11/17/2018); 1.37 m/sec with SPC (11/24/2018); 1.3 m/sec with SPC (12/15/2018; 01/26/2019); 0.4766m(03/23/2019); 0.98 m/sec with SPC (04/20/2019); 1 m/sec (05/31/2019); 0.75 m/sec (07/08/2019);    Time  12    Period  Weeks    Status  Partially Met    Target Date  08/23/19      PT LONG TERM GOAL #5   Title  Patient will score equal or greater than  25/30 on the Functional Gait Assessment to demonstrate low fall risk to improve her ability to participate safely in community activities.     Baseline  testing deferred to next session (11/17/2018); 11/30 high fall risk (11/24/2018); unable to measure due to time limitation (12/15/2018); 13/30 high fall risk (12/17/2018); 14/30 high fall risk (01/26/2019); 14/30 high fall risk(03/23/2019); 17/30 high fall risk (04/20/2019); 21/30 moderate fall risk (05/31/2019); 20/30 moderate fall risk (07/08/2019);    Time  12    Period  Weeks    Status  Partially Met    Target Date  08/23/19      PT LONG TERM GOAL #6   Title  Reduce pain to equal or less than 4/10 with functional activities to allow patient to complete valued functional tasks such as caring for dogs, grocery shopping, walking with less difficulty.(03/23/2019);    Baseline  8/10 (01/26/2019); 6/10 (03/23/2019); 4/10 (04/20/2019); 4/10 (05/31/2019); 5/10 (07/08/2019);    Time  12    Period  Weeks    Status  Partially Met    Target Date  08/23/19            Plan - 07/21/19 1448    Clinical Impression Statement  Patient tolerated treatment well and  continues to make progress towards goals. Today's session focused on updating HEP in patient centered way to prepare for upcoming discharge from physical therapy. Patient was consulted about sets and reps that she felt was realistic and physical therapist could recommend were included. Patient had some knee pain with sit <> stands but was able to continue without complaint given patient selected rest breaks. Blood pressure measured to ensure safety of exercise and was found to be within functional limits. Plan to give more attention to home exercises for  UE and posture at next session. Patient would benefit from continued management of limiting condition by skilled physical therapist to address remaining impairments and functional limitations to work towards stated goals and return to PLOF or maximal functional independence.    Personal Factors and Comorbidities  Age;Comorbidity 3+;Fitness;Time since onset of injury/illness/exacerbation;Past/Current Experience;Other    Comorbidities  underwent  extensive vascular surgeries in Jan 2020 to improve blood flow, similar surgery for R leg scheduled 12/07/2018. Has a history of left carotid artery stenosis, but not enough to have surgery, has a history of multiple arterial surgeries, history of R cataract surgery, colon surgery, cholecystectomy, R foot skin graft, ovary surgery, gastric bypass, R foot fusion, abdominal hysterectomy; current smoker, plantar fasciitis, peripheral arterial disease, LVH, diabetes mellitus with neuropathy, irregular heartbeat, hiatal hernia, GERD, stenosis of left heart ventricle, depression, lumbar degenerative disc disease, cardiac murmur, asthma, anxiety, obesity, HTN, and seasonal allergies She has been advised not to lift over 10 pounds by physician.      Examination-Activity Limitations  Bathing;Carry;Lift;Sit;Stand;Locomotion Level;Toileting;Dressing;Squat;Transfers;Stairs;Hygiene/Grooming;Other    Examination-Participation  Restrictions  Community Activity;Volunteer;Cleaning;Interpersonal Relationship;Yard Work;Other    Rehab Potential  Fair    Clinical Impairments Affecting Rehab Potential  (+) motivated (-) muliple co morbidities, chronic condition    PT Frequency  2x / week    PT Duration  12 weeks    PT Treatment/Interventions  Moist Heat;Patient/family education;Neuromuscular re-education;Therapeutic exercise;Manual techniques;ADLs/Self Care Home Management;Electrical Stimulation;Gait training;Stair training;Functional mobility training;Therapeutic activities;Balance training;Passive range of motion;Dry needling;Energy conservation;Spinal Manipulations;Joint Manipulations;Cryotherapy    PT Next Visit Plan  strengthening, pain control, and promotion of self-management techniques    PT Home Exercise Plan  Medbridge Access Code: [email or web address]    Consulted and Agree with Plan of Care  Patient       Patient will benefit from skilled therapeutic intervention in order to improve the following deficits and impairments:  Decreased strength, Impaired flexibility, Decreased activity tolerance, Impaired perceived functional ability, Pain, Decreased endurance, Difficulty walking, Abnormal gait, Decreased knowledge of use of DME, Decreased skin integrity, Decreased range of motion, Impaired sensation, Improper body mechanics, Obesity, Postural dysfunction, Increased edema, Decreased mobility, Decreased balance, Cardiopulmonary status limiting activity, Decreased coordination  Visit Diagnosis: Muscle weakness (generalized)  Chronic bilateral low back pain, unspecified whether sciatica present  Difficulty in walking, not elsewhere classified  History of falling     Problem List Patient Active Problem List   Diagnosis Date Noted  . Tobacco use disorder 06/15/2019  . Osteopenia 03/03/2019  . Lower extremity edema 07/31/2018  . Pressure injury of skin 07/24/2018  . Surgical site infection 07/23/2018  . Wound of left leg  07/23/2018  . Atherosclerosis of artery of extremity with rest pain ([place]) 07/08/2018  . Shortness of breath 06/02/2018  . Bruit 06/02/2018  . Coronary artery disease of native artery of native heart with stable angina pectoris ([place]) 06/02/2018  . Atherosclerosis of native arteries of extremity with intermittent claudication ([place]) 05/19/2018  . Peripheral arterial disease ([place]) 05/06/2018  . Personal history of colonic polyps   . Aortic stenosis 12/24/2017  . LVH (left ventricular hypertrophy) 12/24/2017  . Hypertension with heart disease 12/24/2017  . Left atrial dilatation 12/12/2017  . Cardiac murmur 12/12/2017  . Gastrojejunal ulcer 12/04/2017  . Hyperphosphatemia 12/04/2017  . Spinal stenosis of lumbar region 06/18/2017  . Degeneration of lumbar intervertebral disc 06/02/2017  . Assistance needed with transportation 05/26/2017  . Financial difficulties 05/26/2017  . Needs assistance with community resources 05/26/2017  . Moderate recurrent major depression ([place]) 05/26/2017  . Non-healing wound of lower extremity 02/06/2017  . Status post ankle arthrodesis 12/11/2016  . Controlled substance agreement broken 10/11/2016  . Low back pain 09/25/2016  . Osteoarthritis of right subtalar joint 07/11/2016  . Posterior tibial tendinitis of right lower extremity 07/11/2016  . Breast cancer screening 06/18/2016  . Knee pain 04/03/2016  .  Hyponatremia 03/01/2016  . High triglycerides 12/24/2015  . Pes planus of both feet 11/16/2015  . Plantar fasciitis of right foot 11/16/2015  . Heel spur 11/16/2015  . Diabetic neuropathy ([place]) 11/16/2015  . Right ankle pain 11/16/2015  . Medication monitoring encounter 11/16/2015  . Chronic pain of multiple joints 08/15/2015  . Abnormal mammogram of right breast 06/19/2015  . Cerumen impaction 03/16/2015  . Hyperlipidemia 12/14/2014  . History of transfusion of packed RBC 12/14/2014  . Hx of smoking 12/14/2014  . Status post bariatric surgery  12/14/2014  . Morbid obesity ([place]) 12/14/2014  . Chronic radicular lumbar pain 12/14/2014  . History of GI bleed 11/12/2014  . History of small bowel obstruction 09/07/2014    Everlean Alstrom. [name], PT, DPT 07/21/19, 2:52 PM  [facility] [address], [place], [address] Phone: [phone]   Fax:  [phone]  Name: Andrea Holden MRN: [id number] Date of Birth: 1946/05/26

## 2019-07-26 ENCOUNTER — Other Ambulatory Visit: Payer: Self-pay

## 2019-07-26 ENCOUNTER — Encounter: Payer: Self-pay | Admitting: Physical Therapy

## 2019-07-26 ENCOUNTER — Ambulatory Visit: Payer: Medicare Other | Admitting: Physical Therapy

## 2019-07-26 DIAGNOSIS — M6281 Muscle weakness (generalized): Secondary | ICD-10-CM

## 2019-07-26 DIAGNOSIS — G8929 Other chronic pain: Secondary | ICD-10-CM

## 2019-07-26 DIAGNOSIS — R262 Difficulty in walking, not elsewhere classified: Secondary | ICD-10-CM

## 2019-07-26 DIAGNOSIS — M545 Low back pain, unspecified: Secondary | ICD-10-CM

## 2019-07-26 DIAGNOSIS — Z9181 History of falling: Secondary | ICD-10-CM

## 2019-07-26 NOTE — Therapy (Signed)
[facility] [address], [place], [address] Phone: [phone]   Fax:  [phone]  Physical Therapy Treatment  Patient Details  Name: Andrea Holden MRN: [id number] Date of Birth: Dec 22, 1945 Referring Provider (PT): Fredderick Severance, NP    Encounter Date: 07/26/2019  PT End of Session - 07/26/19 1406    Visit Number  46    Number of Visits  65    Date for PT Re-Evaluation  08/23/19    Authorization Type  [facility] Medicare reporting period from 07/15/2019    Authorization - Visit Number  3    Authorization - Number of Visits  10    PT Start Time  6948    PT Stop Time  5462    PT Time Calculation (min)  39 min    Equipment Utilized During Treatment  Gait belt    Activity Tolerance  Patient limited by pain;Patient tolerated treatment well;Patient limited by fatigue    Behavior During Therapy  [facility] for tasks assessed/performed   arrived 10 minutes late      Past Medical History:  Diagnosis Date  . Allergy    seasonal allergies  . Anemia 2014   needed 5 units of blood d/t passing out, weak  . Anxiety   . Aortic stenosis 12/24/2017   Echo Aug 2018  . Arthritis   . Asthma    allergy induced asthma  . Cardiac murmur 12/12/2017  . Cataract   . Cataract    left  . Complication of anesthesia    arrhythmia following colonoscopy  . Degenerative disc disease, lumbar   . Degenerative disc disease, lumbar   . Depression   . Diabetes mellitus   . Diabetic neuropathy ([place]) 11/16/2015  . Dysrhythmia    stenosis of left ventricle  . GERD (gastroesophageal reflux disease)   . H/O transfusion    patient was given 5 units of blood while at [place], blood type O+  . History of chicken pox   . History of hiatal hernia   . History of measles, mumps, or rubella   . HOH (hard of hearing)    does not use hearing aides yet  . Hyperlipidemia   . Hypertension   . Irregular heartbeat   . LVH (left ventricular  hypertrophy) 12/24/2017   Echo Aug 2018  . Neuropathy   . Opiate use 11/16/2015  . Peripheral arterial disease ([place]) 05/06/2018   At rest, left > right; refer to vasc  . Pes planus of both feet 11/16/2015  . Plantar fasciitis of right foot 11/16/2015  . Wheezing     Past Surgical History:  Procedure Laterality Date  . ABDOMINAL HYSTERECTOMY    . APPLICATION OF WOUND VAC Left 07/23/2018   Procedure: APPLICATION OF WOUND VAC;  Surgeon: Algernon Huxley, MD;  Location: [facility] ORS;  Service: Vascular;  Laterality: Left;  . banck injections    . CATARACT EXTRACTION W/PHACO Right 05/30/2015   Procedure: CATARACT EXTRACTION PHACO AND INTRAOCULAR LENS PLACEMENT (IOC);  Surgeon: Birder Robson, MD;  Location: [facility] OR[facility];  Service: Ophthalmology;  Laterality: Right;  [place] 00:57 AP% 20.9 CDE 11.99 fluid pack lot #[id number]  . CHOLECYSTECTOMY  1970  . COLON SURGERY  2013   blocked colon  . COLONOSCOPY WITH PROPOFOL N/A 01/20/2018   Procedure: COLONOSCOPY WITH PROPOFOL;  Surgeon: Lucilla Lame, MD;  Location: [facility];  Service: Endoscopy;  Laterality: N/A;  . ENDARTERECTOMY FEMORAL Left 07/08/2018  Procedure: ENDARTERECTOMY FEMORAL;  Surgeon: Algernon Huxley, MD;  Location: [facility] ORS;  Service: Vascular;  Laterality: Left;  . EYE SURGERY Right    cataract surgery  . FOOT FUSION Right 2018   metal in foot  . GALLBLADDER SURGERY  1970  . GASTRIC BYPASS  2010   lost 178 lbs and regained 40 lbs last few years  . HUMERUS FRACTURE SURGERY Right    metal plate with screws  . internal bleeding  2016   ulcer in past  . LOWER EXTREMITY ANGIOGRAM Left 07/08/2018   Procedure: LOWER EXTREMITY ANGIOGRAM;  Surgeon: Algernon Huxley, MD;  Location: [facility] OR[facility];  Service: Vascular;  Laterality: Left;  . LOWER EXTREMITY ANGIOGRAPHY Left 05/27/2018   Procedure: LOWER EXTREMITY ANGIOGRAPHY;  Surgeon: Algernon Huxley, MD;  Location: [facility];  Service: Cardiovascular;  Laterality: Left;  . LOWER EXTREMITY ANGIOGRAPHY  Right 12/07/2018   Procedure: LOWER EXTREMITY ANGIOGRAPHY;  Surgeon: Algernon Huxley, MD;  Location: [facility];  Service: Cardiovascular;  Laterality: Right;  . LOWER EXTREMITY ANGIOGRAPHY Left 05/10/2019   Procedure: LOWER EXTREMITY ANGIOGRAPHY;  Surgeon: Algernon Huxley, MD;  Location: [place];  Service: Cardiovascular;  Laterality: Left;  . MOUTH SURGERY     root canals and crowns and extractions  . OVARY SURGERY    . SKIN GRAFT Right 2018   RT foot. foot has been rebuilt.  it is full of metal  . TENOTOMY ACHILLES TENDON Right    Percuntaneous. metal in foot  . TONSILLECTOMY    . WOUND DEBRIDEMENT Left 07/23/2018   Procedure: DEBRIDEMENT WOUND;  Surgeon: Algernon Huxley, MD;  Location: [facility] OR[facility];  Service: Vascular;  Laterality: Left;    There were no vitals filed for this visit.  Subjective Assessment - 07/26/19 1356    Subjective  Patient reports her back is 4.5/10 pain in her back, and 4/10 overall pain ("not bad"), She reports she has been practicing the sit <> stands and bed exercises and leg lifts without problems besides getting a bit confused. She was slightly sore in her thighs following last treatment session.    Pertinent History  Patient is a 74 y.o. female who presents to outpatient physical therapy with a referral for medical diagnosis of degeneration of lumbar intervertebral disc. This patient's chief complaints consist of chronic low back pain and foot pain, weakness, stiffness, leading to the following functional deficits: difficulty with with basic ADLs, IADLs, ambulation. She used to get injections to help control back pain but it has recently worsened due to being unable to get injections during [place] pandemic. The patient has been informed of current processes in place at [facility]patient [facility] to protect patients from Covid-19 exposure including social distancing, schedule modifications, and new cleaning procedures. After discussing their particular risk with a  therapist based on the patient's personal risk factors, the patient has decided to proceed with in-person therapy. Pt is scheduled for R LE angiography on 12/07/2018. She has recently started smoking again with the stress of COVID19 pandemic and feels strongly she needs to stop. Surgeon has stated in the past he will not complete surgery if she is smoking.  Relevant past medical history and comorbidities include underwent extensive vascular surgeries in Jan 2020 to improve blood flow, similar surgery for R leg scheduled 12/07/2018. Has a history of left carotid artery stenosis, but not enough to have surgery, has a history of multiple arterial surgeries, history of R cataract surgery, colon  surgery, cholecystectomy, R foot skin graft, ovary surgery, gastric bypass, R foot fusion, abdominal hysterectomy; current smoker, plantar fasciitis, peripheral arterial disease, LVH, diabetes mellitus with neuropathy, irregular heartbeat, hiatal hernia, GERD, stenosis of left heart ventricle, depression, lumbar degenerative disc disease, cardiac murmur, asthma, anxiety, obesity, HTN, and seasonal allergies She has been advised not to lift over 10 pounds by physician    Limitations  Sitting;Standing;Walking;House hold activities;Lifting    How long can you sit comfortably?  > 1 hour in almost any chair    How long can you stand comfortably?  10-15 min while cooking    How long can you walk comfortably?  2 times to the mailbox and back (120 feet to mailbox) with single point cane. Can ambulate 1 hour with rollator with seated resting breaks.    Diagnostic tests  MRI lumbar spine report 06/14/2017: "IMPRESSION: 1. Stable degenerative of lumbar spondylosis and scoliosis with multilevel disc disease and facet disease. 2. Stable right foraminal stenosis at L5-S1 due to right-sided disc    Patient Stated Goals  patient would like to get back to PLOF, walking further, be better able to participate in dog showing and trips     Currently in Pain?  Yes    Pain Score  4     Pain Location  Back    Pain Onset  More than a month ago       TREATMENT  Patient goal until next session: 2 laps walking around outside of home each day.  Therapeutic exercise:to centralize symptoms and improve ROM, strength, muscular endurance, and activity tolerance required for successful completion of functional activities.  LargeNuSteplevel1-2 (mostly 2) usingBLEs.Seatsetting 8. For improved extremity mobility, muscular endurance, and activity tolerance; and to induce the analgesic effect of aerobic exercise, stimulate improved joint nutrition, and prepare body structures and systems for following interventions.during subjective exam. Average SPM =78.x30mn during subjective exam.   Sit <> stand from20.1inchseat (green chair with airex) with no UE support, cuing for glute contraction.2x15.R knee painful Legs getting shaky last three.  Standing with touch down UE support at bar, balance exercises: .  - narrow stance with horizontal head rotations x20 - narrow stance with nods x20 - semi-tandem stance 2x 45 seconds each side added head rotations last set.   Education on HEP with updates for seated exercises and review of balance exercises.   Cuing for purpose of exercise, translation to personal functional goals.Encouragement to continue HEP. Required  cuing to stay on task throughout session.   HOME EXERCISE PROGRAM (pt has 2# DB at home) Access Code: [email or web address] URL: [email or web address]  Date: 07/26/2019  Prepared by: SRosita Kea  Exercises Walking - 1.5 to the mailbox and back - 1.5 2 minutes in the house - 1x daily - 7x weekly Seated Shoulder Flexion with Dumbbells - 2 sets - 5 reps - 1 second hold - 1 every other day Seated Shoulder Abduction with Dumbbells - Thumbs Up - 2 sets - 15 reps - 1 second hold - 1 every other day Seated Shoulder Horizontal Abduction with Resistance -  2 sets - 15 reps - 1 second hold - 1 every other day Seated Anti-Rotation Press with Anchored Resistance - 2 sets - 15 reps - 1 second hold - 1 every other day Seated March - 2 reps - 1 minute - 1 every other day Sit to Stand without Arm Support - 2-3 sets - 10 reps - 1 every other day Sidelying Hip Abduction - 2-3 sets -  10-15 reps - 1 second hold - 1 every other day Bridge - 2-3 sets - 5-10 reps - 1-10 seconds hold - 1 Every Other Day Active Straight Leg Raise with Quad Set - 2-3 sets - 10-15 reps - 1 every other day Narrow Stance with Head Rotations and Counter Support - 2 sets - 20 reps - 1 every other day Narrow Stance with Head Nods and Counter Support - 2 sets - 20 reps - 1 every other day Standing Tandem Balance with Counter Support - 2 reps - 45 seconds hold - 1 every other day    PT Education - 07/26/19 1406    Education provided  Yes    Education Details  Exercise purpose/form. Self management techniques. HEP    Person(s) Educated  Patient    Methods  Explanation;Demonstration;Tactile cues;Verbal cues;Handout    Comprehension  Verbalized understanding;Returned demonstration;Verbal cues required;Need further instruction;Tactile cues required       PT Short Term Goals - 05/31/19 2104      PT SHORT TERM GOAL #1   Title  Be independent with initial home exercise program for self-management of symptoms.    Baseline  Initial HEP provided at [facility] (11/17/2018); was completing until vascular procedure, re-instated 12/15/2018 (12/15/2018); currently participating (01/26/2019); Partially doing HEP due the patient's recent health condition/weakness(03/23/2019); Patient has been doing her HEP every other day. 04/20/2019; has returned to completing her walking HEP as prescribed (05/31/2019);    Time  2    Period  Weeks    Status  Achieved    Target Date  05/04/19        PT Long Term Goals - 07/08/19 1723      PT LONG TERM GOAL #1   Title  Be independent with a long-term home exercise  program for self-management of symptoms.     Baseline  Initial HEP provided at [facility] (11/17/2018); was doing better before vascular surgery, is working at returning to [facility] since procedure (12/15/2018); currently participating but not assigned final long term HEP yet (01/26/2019); currently participating but not assigned final long term HEP yet (03/23/2019); HEP progressing (04/20/2019; 05/31/2019; 07/08/2019);    Time  12    Period  Weeks    Status  Partially Met    Target Date  08/23/19      PT LONG TERM GOAL #2   Title  Patient will improve 6MWT to equal or greater than 1000 feet with LRAD to demonstrate improved activity tolerance for community ambulation.     Baseline  6 Minute Walk Test: 200 feet with SPC and supervision. Stopped at 1:43 due to fatigue and leg pain. (11/17/2018); 457 feet SPC, on seated and one standing break (12/15/2018); 547 with SPC, 2 seated and one standing rest break (01/26/2019); 468 feet with SPC, 2 seated breaks(03/23/2019); 638 feet with SPC, 2 seated breaks; 747 feet with SPC and 2 standing rest breaks (05/31/2019); 628 feet with SPC and 3 standing rest breaks (07/08/2019);    Time  12    Period  Weeks    Status  Partially Met    Target Date  08/23/19      PT LONG TERM GOAL #3   Title  Patient will demonstrate improved 5 Times Sit to Stand test to 11 seconds from chair height with no UE support to demonstrate improvement in LE power and functional strength for daily activities.    Baseline  5 Time Sit to Stand: 27 seconds from chair height no UE support (11/17/2018); 18  seconds, no UE support from chair-high plinth (12/15/2018); 16 seconds with no UE support from chair (01/26/2019); 17.45s with lack of knee extension in standing(03/23/2019); 14.03s without BUEs help (04/20/2019); 16 seconds without BUE help from 18.5 inch seat (05/31/2019); 16 seconds, no UE support from 18.5 inch plinth (07/08/2019);    Time  12    Period  Weeks    Status  Partially Met      PT LONG TERM GOAL #4    Title  Patient will ambulate faster than 1.99ms on the 10 Meter Walk Test to improve ability to cross street safey for community participation(05/21/2019); 258m with SPC (05/31/2019);    Baseline  testing deferred to next session (11/17/2018); 1.37 m/sec with SPC (11/24/2018); 1.3 m/sec with SPC (12/15/2018; 01/26/2019); 0.4723m(03/23/2019); 0.98 m/sec with SPC (04/20/2019); 1 m/sec (05/31/2019); 0.75 m/sec (07/08/2019);    Time  12    Period  Weeks    Status  Partially Met    Target Date  08/23/19      PT LONG TERM GOAL #5   Title  Patient will score equal or greater than  25/30 on the Functional Gait Assessment to demonstrate low fall risk to improve her ability to participate safely in community activities.     Baseline  testing deferred to next session (11/17/2018); 11/30 high fall risk (11/24/2018); unable to measure due to time limitation (12/15/2018); 13/30 high fall risk (12/17/2018); 14/30 high fall risk (01/26/2019); 14/30 high fall risk(03/23/2019); 17/30 high fall risk (04/20/2019); 21/30 moderate fall risk (05/31/2019); 20/30 moderate fall risk (07/08/2019);    Time  12    Period  Weeks    Status  Partially Met    Target Date  08/23/19      PT LONG TERM GOAL #6   Title  Reduce pain to equal or less than 4/10 with functional activities to allow patient to complete valued functional tasks such as caring for dogs, grocery shopping, walking with less difficulty.(03/23/2019);    Baseline  8/10 (01/26/2019); 6/10 (03/23/2019); 4/10 (04/20/2019); 4/10 (05/31/2019); 5/10 (07/08/2019);    Time  12    Period  Weeks    Status  Partially Met    Target Date  08/23/19            Plan - 07/26/19 1700    Clinical Impression Statement  Patient tolerated treatment well and is progressing well towards goal of becoming more independent at home with HEP. Demonstrated improvements in standing balance after practicing what she remembered doing. Updated seated exercises (that have been previously completed)  and provided handout. Patient required significant cuing to stay on task this session. Would benefit from continued reinforcement of HEP and opportunity to work out difficulties she encounters with it at home. Patient would benefit from continued management of limiting condition by skilled physical therapist to address remaining impairments and functional limitations to work towards stated goals and return to PLOF or maximal functional independence.    Personal Factors and Comorbidities  Age;Comorbidity 3+;Fitness;Time since onset of injury/illness/exacerbation;Past/Current Experience;Other    Comorbidities  underwent extensive vascular surgeries in Jan 2020 to improve blood flow, similar surgery for R leg scheduled 12/07/2018. Has a history of left carotid artery stenosis, but not enough to have surgery, has a history of multiple arterial surgeries, history of R cataract surgery, colon surgery, cholecystectomy, R foot skin graft, ovary surgery, gastric bypass, R foot fusion, abdominal hysterectomy; current smoker, plantar fasciitis, peripheral arterial disease, LVH, diabetes mellitus with neuropathy, irregular heartbeat, hiatal hernia,  GERD, stenosis of left heart ventricle, depression, lumbar degenerative disc disease, cardiac murmur, asthma, anxiety, obesity, HTN, and seasonal allergies She has been advised not to lift over 10 pounds by physician.      Examination-Activity Limitations  Bathing;Carry;Lift;Sit;Stand;Locomotion Level;Toileting;Dressing;Squat;Transfers;Stairs;Hygiene/Grooming;Other    Examination-Participation Restrictions  Community Activity;Volunteer;Cleaning;Interpersonal Relationship;Yard Work;Other    Rehab Potential  Fair    Clinical Impairments Affecting Rehab Potential  (+) motivated (-) muliple co morbidities, chronic condition    PT Frequency  2x / week    PT Duration  12 weeks    PT Treatment/Interventions  Moist Heat;Patient/family education;Neuromuscular re-education;Therapeutic  exercise;Manual techniques;ADLs/Self Care Home Management;Electrical Stimulation;Gait training;Stair training;Functional mobility training;Therapeutic activities;Balance training;Passive range of motion;Dry needling;Energy conservation;Spinal Manipulations;Joint Manipulations;Cryotherapy    PT Next Visit Plan  strengthening, pain control, and promotion of self-management techniques    PT Home Exercise Plan  Medbridge Access Code: [email or web address]    Consulted and Agree with Plan of Care  Patient       Patient will benefit from skilled therapeutic intervention in order to improve the following deficits and impairments:  Decreased strength, Impaired flexibility, Decreased activity tolerance, Impaired perceived functional ability, Pain, Decreased endurance, Difficulty walking, Abnormal gait, Decreased knowledge of use of DME, Decreased skin integrity, Decreased range of motion, Impaired sensation, Improper body mechanics, Obesity, Postural dysfunction, Increased edema, Decreased mobility, Decreased balance, Cardiopulmonary status limiting activity, Decreased coordination  Visit Diagnosis: Muscle weakness (generalized)  Chronic bilateral low back pain, unspecified whether sciatica present  Difficulty in walking, not elsewhere classified  History of falling     Problem List Patient Active Problem List   Diagnosis Date Noted  . Tobacco use disorder 06/15/2019  . Osteopenia 03/03/2019  . Lower extremity edema 07/31/2018  . Pressure injury of skin 07/24/2018  . Surgical site infection 07/23/2018  . Wound of left leg 07/23/2018  . Atherosclerosis of artery of extremity with rest pain ([place]) 07/08/2018  . Shortness of breath 06/02/2018  . Bruit 06/02/2018  . Coronary artery disease of native artery of native heart with stable angina pectoris ([place]) 06/02/2018  . Atherosclerosis of native arteries of extremity with intermittent claudication ([place]) 05/19/2018  . Peripheral arterial disease ([place])  05/06/2018  . Personal history of colonic polyps   . Aortic stenosis 12/24/2017  . LVH (left ventricular hypertrophy) 12/24/2017  . Hypertension with heart disease 12/24/2017  . Left atrial dilatation 12/12/2017  . Cardiac murmur 12/12/2017  . Gastrojejunal ulcer 12/04/2017  . Hyperphosphatemia 12/04/2017  . Spinal stenosis of lumbar region 06/18/2017  . Degeneration of lumbar intervertebral disc 06/02/2017  . Assistance needed with transportation 05/26/2017  . Financial difficulties 05/26/2017  . Needs assistance with community resources 05/26/2017  . Moderate recurrent major depression ([place]) 05/26/2017  . Non-healing wound of lower extremity 02/06/2017  . Status post ankle arthrodesis 12/11/2016  . Controlled substance agreement broken 10/11/2016  . Low back pain 09/25/2016  . Osteoarthritis of right subtalar joint 07/11/2016  . Posterior tibial tendinitis of right lower extremity 07/11/2016  . Breast cancer screening 06/18/2016  . Knee pain 04/03/2016  . Hyponatremia 03/01/2016  . High triglycerides 12/24/2015  . Pes planus of both feet 11/16/2015  . Plantar fasciitis of right foot 11/16/2015  . Heel spur 11/16/2015  . Diabetic neuropathy ([place]) 11/16/2015  . Right ankle pain 11/16/2015  . Medication monitoring encounter 11/16/2015  . Chronic pain of multiple joints 08/15/2015  . Abnormal mammogram of right breast 06/19/2015  . Cerumen impaction 03/16/2015  . Hyperlipidemia 12/14/2014  . History of  transfusion of packed RBC 12/14/2014  . Hx of smoking 12/14/2014  . Status post bariatric surgery 12/14/2014  . Morbid obesity ([place]) 12/14/2014  . Chronic radicular lumbar pain 12/14/2014  . History of GI bleed 11/12/2014  . History of small bowel obstruction 09/07/2014    Everlean Alstrom. [name], PT, DPT 07/26/19, 5:00 PM  [facility] [address] [place], [address] Phone: [phone]   Fax:   [phone]  Name: LUMI WINSLETT MRN: [id number] Date of Birth: 04/19/1946

## 2019-07-27 ENCOUNTER — Encounter (INDEPENDENT_AMBULATORY_CARE_PROVIDER_SITE_OTHER): Payer: Self-pay

## 2019-07-28 ENCOUNTER — Ambulatory Visit: Payer: Medicare Other | Admitting: Physical Therapy

## 2019-07-28 ENCOUNTER — Encounter: Payer: Self-pay | Admitting: Physical Therapy

## 2019-07-28 ENCOUNTER — Other Ambulatory Visit: Payer: Self-pay

## 2019-07-28 DIAGNOSIS — G8929 Other chronic pain: Secondary | ICD-10-CM

## 2019-07-28 DIAGNOSIS — M6281 Muscle weakness (generalized): Secondary | ICD-10-CM

## 2019-07-28 DIAGNOSIS — R262 Difficulty in walking, not elsewhere classified: Secondary | ICD-10-CM

## 2019-07-28 DIAGNOSIS — M545 Low back pain, unspecified: Secondary | ICD-10-CM

## 2019-07-28 DIAGNOSIS — Z9181 History of falling: Secondary | ICD-10-CM

## 2019-07-28 NOTE — Therapy (Signed)
[facility] [address], [place], [address] Phone: [phone]   Fax:  [phone]  Physical Therapy Treatment  Patient Details  Name: Andrea Holden MRN: [id number] Date of Birth: 1946/03/13 Referring Provider (PT): Fredderick Severance, NP    Encounter Date: 07/28/2019  PT End of Session - 07/28/19 1359    Visit Number  54    Number of Visits  65    Date for PT Re-Evaluation  08/23/19    Authorization Type  [facility] Medicare reporting period from 07/15/2019    Authorization - Visit Number  4    Authorization - Number of Visits  10    PT Start Time  6979    PT Stop Time  1430    PT Time Calculation (min)  38 min    Equipment Utilized During Treatment  Gait belt    Activity Tolerance  Patient limited by pain;Patient tolerated treatment well;Patient limited by fatigue    Behavior During Therapy  [facility] for tasks assessed/performed   arrived 10 minutes late      Past Medical History:  Diagnosis Date  . Allergy    seasonal allergies  . Anemia 2014   needed 5 units of blood d/t passing out, weak  . Anxiety   . Aortic stenosis 12/24/2017   Echo Aug 2018  . Arthritis   . Asthma    allergy induced asthma  . Cardiac murmur 12/12/2017  . Cataract   . Cataract    left  . Complication of anesthesia    arrhythmia following colonoscopy  . Degenerative disc disease, lumbar   . Degenerative disc disease, lumbar   . Depression   . Diabetes mellitus   . Diabetic neuropathy ([place]) 11/16/2015  . Dysrhythmia    stenosis of left ventricle  . GERD (gastroesophageal reflux disease)   . H/O transfusion    patient was given 5 units of blood while at [place], blood type O+  . History of chicken pox   . History of hiatal hernia   . History of measles, mumps, or rubella   . HOH (hard of hearing)    does not use hearing aides yet  . Hyperlipidemia   . Hypertension   . Irregular heartbeat   . LVH (left ventricular  hypertrophy) 12/24/2017   Echo Aug 2018  . Neuropathy   . Opiate use 11/16/2015  . Peripheral arterial disease ([place]) 05/06/2018   At rest, left > right; refer to vasc  . Pes planus of both feet 11/16/2015  . Plantar fasciitis of right foot 11/16/2015  . Wheezing     Past Surgical History:  Procedure Laterality Date  . ABDOMINAL HYSTERECTOMY    . APPLICATION OF WOUND VAC Left 07/23/2018   Procedure: APPLICATION OF WOUND VAC;  Surgeon: Algernon Huxley, MD;  Location: [facility] ORS;  Service: Vascular;  Laterality: Left;  . banck injections    . CATARACT EXTRACTION W/PHACO Right 05/30/2015   Procedure: CATARACT EXTRACTION PHACO AND INTRAOCULAR LENS PLACEMENT (IOC);  Surgeon: Birder Robson, MD;  Location: [facility] ORS;  Service: Ophthalmology;  Laterality: Right;  [place] 00:57 AP% 20.9 CDE 11.99 fluid pack lot #[id number]  . CHOLECYSTECTOMY  1970  . COLON SURGERY  2013   blocked colon  . COLONOSCOPY WITH PROPOFOL N/A 01/20/2018   Procedure: COLONOSCOPY WITH PROPOFOL;  Surgeon: Lucilla Lame, MD;  Location: [facility]COPY;  Service: Endoscopy;  Laterality: N/A;  . ENDARTERECTOMY FEMORAL Left 07/08/2018  Procedure: ENDARTERECTOMY FEMORAL;  Surgeon: Algernon Huxley, MD;  Location: [facility] ORS;  Service: Vascular;  Laterality: Left;  . EYE SURGERY Right    cataract surgery  . FOOT FUSION Right 2018   metal in foot  . GALLBLADDER SURGERY  1970  . GASTRIC BYPASS  2010   lost 178 lbs and regained 40 lbs last few years  . HUMERUS FRACTURE SURGERY Right    metal plate with screws  . internal bleeding  2016   ulcer in past  . LOWER EXTREMITY ANGIOGRAM Left 07/08/2018   Procedure: LOWER EXTREMITY ANGIOGRAM;  Surgeon: Algernon Huxley, MD;  Location: [facility] ORS;  Service: Vascular;  Laterality: Left;  . LOWER EXTREMITY ANGIOGRAPHY Left 05/27/2018   Procedure: LOWER EXTREMITY ANGIOGRAPHY;  Surgeon: Algernon Huxley, MD;  Location: [facility];  Service: Cardiovascular;  Laterality: Left;  . LOWER EXTREMITY ANGIOGRAPHY  Right 12/07/2018   Procedure: LOWER EXTREMITY ANGIOGRAPHY;  Surgeon: Algernon Huxley, MD;  Location: [employer] [facility];  Service: Cardiovascular;  Laterality: Right;  . LOWER EXTREMITY ANGIOGRAPHY Left 05/10/2019   Procedure: LOWER EXTREMITY ANGIOGRAPHY;  Surgeon: Algernon Huxley, MD;  Location: [facility];  Service: Cardiovascular;  Laterality: Left;  . MOUTH SURGERY     root canals and crowns and extractions  . OVARY SURGERY    . SKIN GRAFT Right 2018   RT foot. foot has been rebuilt.  it is full of metal  . TENOTOMY ACHILLES TENDON Right    Percuntaneous. metal in foot  . TONSILLECTOMY    . WOUND DEBRIDEMENT Left 07/23/2018   Procedure: DEBRIDEMENT WOUND;  Surgeon: Algernon Huxley, MD;  Location: [facility] ORS;  Service: Vascular;  Laterality: Left;    There were no vitals filed for this visit.  Subjective Assessment - 07/28/19 1355    Subjective  Patient reports her back pain is about 5/10 on both sides in the low back. She was pretty sore following last treatment session and her legs felt heavy yesterday. She tried out her balance exercises and they went well.    Pertinent History  Patient is a 74 y.o. female who presents to outpatient physical therapy with a referral for medical diagnosis of degeneration of lumbar intervertebral disc. This patient's chief complaints consist of chronic low back pain and foot pain, weakness, stiffness, leading to the following functional deficits: difficulty with with basic ADLs, IADLs, ambulation. She used to get injections to help control back pain but it has recently worsened due to being unable to get injections during [place] pandemic. The patient has been informed of current processes in place at [facility] to protect patients from Covid-19 exposure including social distancing, schedule modifications, and new cleaning procedures. After discussing their particular risk with a therapist based on the patient's personal risk factors, the patient has  decided to proceed with in-person therapy. Pt is scheduled for R LE angiography on 12/07/2018. She has recently started smoking again with the stress of COVID19 pandemic and feels strongly she needs to stop. Surgeon has stated in the past he will not complete surgery if she is smoking.  Relevant past medical history and comorbidities include underwent extensive vascular surgeries in Jan 2020 to improve blood flow, similar surgery for R leg scheduled 12/07/2018. Has a history of left carotid artery stenosis, but not enough to have surgery, has a history of multiple arterial surgeries, history of R cataract surgery, colon surgery, cholecystectomy, R foot skin graft, ovary surgery, gastric bypass, R  foot fusion, abdominal hysterectomy; current smoker, plantar fasciitis, peripheral arterial disease, LVH, diabetes mellitus with neuropathy, irregular heartbeat, hiatal hernia, GERD, stenosis of left heart ventricle, depression, lumbar degenerative disc disease, cardiac murmur, asthma, anxiety, obesity, HTN, and seasonal allergies She has been advised not to lift over 10 pounds by physician    Limitations  Sitting;Standing;Walking;House hold activities;Lifting    How long can you sit comfortably?  > 1 hour in almost any chair    How long can you stand comfortably?  10-15 min while cooking    How long can you walk comfortably?  2 times to the mailbox and back (120 feet to mailbox) with single point cane. Can ambulate 1 hour with rollator with seated resting breaks.    Diagnostic tests  MRI lumbar spine report 06/14/2017: "IMPRESSION: 1. Stable degenerative of lumbar spondylosis and scoliosis with multilevel disc disease and facet disease. 2. Stable right foraminal stenosis at L5-S1 due to right-sided disc    Patient Stated Goals  patient would like to get back to PLOF, walking further, be better able to participate in dog showing and trips    Currently in Pain?  Yes    Pain Score  5     Pain Location  Back    Pain  Orientation  Left;Right;Lower    Pain Descriptors / Indicators  Aching    Pain Onset  More than a month ago         TREATMENT  Patient goal until next session: 2 laps walking around outside of home each day.practicing HEP.  Therapeutic exercise:to centralize symptoms and improve ROM, strength, muscular endurance, and activity tolerance required for successful completion of functional activities.  LargeNuSteplevel1-2 (level 2 from 2 min on) usingBLEs.Seatsetting 7. For improved extremity mobility, muscular endurance, and activity tolerance; and to induce the analgesic effect of aerobic exercise, stimulate improved joint nutrition, and prepare body structures and systems for following interventions.during subjective exam. Average SPM =87 (up to 106).x85mn during subjective exam.   Sit <> stand from20.1inchseat(green chair with airex)with no UE support, cuing for glute contraction.3x10.R knee painful Legs getting shaky last three.  Seated exercises with trunk/legs unsupported and no UE support to improve core strength:  - B shoulder flexion,x15(3#) - B shoulder abduction, x12 (3#) - B horizontal shoulder abduction, yellow theraband loop,x15  - pallof press,redtheraband loop, x15each side - standing marching x 1 min - standing row x 30 with green theraband   Education on HEP with updates for seated exercises and review of balance exercises.   Cuing for purpose of exercise, translation to personal functional goals.Encouragement to continue HEP. Required cuing to stay on task throughout session.   HOME EXERCISE PROGRAM (pt has 2# DB at home) Access Code: [email or web address]     URL: [email or web address]    Date: 07/26/2019  Prepared by: SRosita Kea  Exercises Walking - 1.5 to the mailbox and back - 1.5 2 minutes in the house - 1x daily - 7x weekly Seated Shoulder Flexion with Dumbbells - 2 sets - 5 reps - 1 second hold - 1 every  other day Seated Shoulder Abduction with Dumbbells - Thumbs Up - 2 sets - 15 reps - 1 second hold - 1 every other day Seated Shoulder Horizontal Abduction with Resistance - 2 sets - 15 reps - 1 second hold - 1 every other day Seated Anti-Rotation Press with Anchored Resistance - 2 sets - 15 reps - 1 second hold - 1 every other day Seated March - 2  reps - 1 minute - 1 every other day Sit to Stand without Arm Support - 2-3 sets - 10 reps - 1 every other day Sidelying Hip Abduction - 2-3 sets - 10-15 reps - 1 second hold - 1 every other day Bridge - 2-3 sets - 5-10 reps - 1-10 seconds hold - 1 Every Other Day Active Straight Leg Raise with Quad Set - 2-3 sets - 10-15 reps - 1 every other day Narrow Stance with Head Rotations and Counter Support - 2 sets - 20 reps - 1 every other day Narrow Stance with Head Nods and Counter Support - 2 sets - 20 reps - 1 every other day Standing Tandem Balance with Counter Support - 2 reps - 45 seconds hold - 1 every other day    PT Education - 07/28/19 1359    Education provided  Yes    Education Details  Exercise purpose/form. Self management techniques    Person(s) Educated  Patient    Methods  Explanation;Demonstration;Tactile cues;Verbal cues    Comprehension  Verbalized understanding;Returned demonstration;Verbal cues required;Tactile cues required;Need further instruction       PT Short Term Goals - 05/31/19 2104      PT SHORT TERM GOAL #1   Title  Be independent with initial home exercise program for self-management of symptoms.    Baseline  Initial HEP provided at [facility] (11/17/2018); was completing until vascular procedure, re-instated 12/15/2018 (12/15/2018); currently participating (01/26/2019); Partially doing HEP due the patient's recent health condition/weakness(03/23/2019); Patient has been doing her HEP every other day. 04/20/2019; has returned to completing her walking HEP as prescribed (05/31/2019);    Time  2    Period  Weeks    Status   Achieved    Target Date  05/04/19        PT Long Term Goals - 07/08/19 1723      PT LONG TERM GOAL #1   Title  Be independent with a long-term home exercise program for self-management of symptoms.     Baseline  Initial HEP provided at [facility] (11/17/2018); was doing better before vascular surgery, is working at returning to [facility] since procedure (12/15/2018); currently participating but not assigned final long term HEP yet (01/26/2019); currently participating but not assigned final long term HEP yet (03/23/2019); HEP progressing (04/20/2019; 05/31/2019; 07/08/2019);    Time  12    Period  Weeks    Status  Partially Met    Target Date  08/23/19      PT LONG TERM GOAL #2   Title  Patient will improve 6MWT to equal or greater than 1000 feet with LRAD to demonstrate improved activity tolerance for community ambulation.     Baseline  6 Minute Walk Test: 200 feet with SPC and supervision. Stopped at 1:43 due to fatigue and leg pain. (11/17/2018); 457 feet SPC, on seated and one standing break (12/15/2018); 547 with SPC, 2 seated and one standing rest break (01/26/2019); 468 feet with SPC, 2 seated breaks(03/23/2019); 638 feet with SPC, 2 seated breaks; 747 feet with SPC and 2 standing rest breaks (05/31/2019); 628 feet with SPC and 3 standing rest breaks (07/08/2019);    Time  12    Period  Weeks    Status  Partially Met    Target Date  08/23/19      PT LONG TERM GOAL #3   Title  Patient will demonstrate improved 5 Times Sit to Stand test to 11 seconds from chair height with no UE support  to demonstrate improvement in LE power and functional strength for daily activities.    Baseline  5 Time Sit to Stand: 27 seconds from chair height no UE support (11/17/2018); 18 seconds, no UE support from chair-high plinth (12/15/2018); 16 seconds with no UE support from chair (01/26/2019); 17.45s with lack of knee extension in standing(03/23/2019); 14.03s without BUEs help (04/20/2019); 16 seconds without BUE help from 18.5  inch seat (05/31/2019); 16 seconds, no UE support from 18.5 inch plinth (07/08/2019);    Time  12    Period  Weeks    Status  Partially Met      PT LONG TERM GOAL #4   Title  Patient will ambulate faster than 1.67ms on the 10 Meter Walk Test to improve ability to cross street safey for community participation(05/21/2019); 218m with SPC (05/31/2019);    Baseline  testing deferred to next session (11/17/2018); 1.37 m/sec with SPC (11/24/2018); 1.3 m/sec with SPC (12/15/2018; 01/26/2019); 0.4747m(03/23/2019); 0.98 m/sec with SPC (04/20/2019); 1 m/sec (05/31/2019); 0.75 m/sec (07/08/2019);    Time  12    Period  Weeks    Status  Partially Met    Target Date  08/23/19      PT LONG TERM GOAL #5   Title  Patient will score equal or greater than  25/30 on the Functional Gait Assessment to demonstrate low fall risk to improve her ability to participate safely in community activities.     Baseline  testing deferred to next session (11/17/2018); 11/30 high fall risk (11/24/2018); unable to measure due to time limitation (12/15/2018); 13/30 high fall risk (12/17/2018); 14/30 high fall risk (01/26/2019); 14/30 high fall risk(03/23/2019); 17/30 high fall risk (04/20/2019); 21/30 moderate fall risk (05/31/2019); 20/30 moderate fall risk (07/08/2019);    Time  12    Period  Weeks    Status  Partially Met    Target Date  08/23/19      PT LONG TERM GOAL #6   Title  Reduce pain to equal or less than 4/10 with functional activities to allow patient to complete valued functional tasks such as caring for dogs, grocery shopping, walking with less difficulty.(03/23/2019);    Baseline  8/10 (01/26/2019); 6/10 (03/23/2019); 4/10 (04/20/2019); 4/10 (05/31/2019); 5/10 (07/08/2019);    Time  12    Period  Weeks    Status  Partially Met    Target Date  08/23/19            Plan - 07/28/19 1622    Clinical Impression Statement  Patient tolerated treatment well overall. Was interested in starting a fitness program at wellness  center [facility] so sent referral for her to be contacted. Reviewed UE and seated exercises today that have been added to HEP. Continues to require significant cuing to perform. Patient would benefit from continued management of limiting condition by skilled physical therapist to address remaining impairments and functional limitations to work towards stated goals and return to PLOF or maximal functional independence.    Personal Factors and Comorbidities  Age;Comorbidity 3+;Fitness;Time since onset of injury/illness/exacerbation;Past/Current Experience;Other    Comorbidities  underwent extensive vascular surgeries in Jan 2020 to improve blood flow, similar surgery for R leg scheduled 12/07/2018. Has a history of left carotid artery stenosis, but not enough to have surgery, has a history of multiple arterial surgeries, history of R cataract surgery, colon surgery, cholecystectomy, R foot skin graft, ovary surgery, gastric bypass, R foot fusion, abdominal hysterectomy; current smoker, plantar fasciitis, peripheral arterial disease, LVH,  diabetes mellitus with neuropathy, irregular heartbeat, hiatal hernia, GERD, stenosis of left heart ventricle, depression, lumbar degenerative disc disease, cardiac murmur, asthma, anxiety, obesity, HTN, and seasonal allergies She has been advised not to lift over 10 pounds by physician.      Examination-Activity Limitations  Bathing;Carry;Lift;Sit;Stand;Locomotion Level;Toileting;Dressing;Squat;Transfers;Stairs;Hygiene/Grooming;Other    Examination-Participation Restrictions  Community Activity;Volunteer;Cleaning;Interpersonal Relationship;Yard Work;Other    Rehab Potential  Fair    Clinical Impairments Affecting Rehab Potential  (+) motivated (-) muliple co morbidities, chronic condition    PT Frequency  2x / week    PT Duration  12 weeks    PT Treatment/Interventions  Moist Heat;Patient/family education;Neuromuscular re-education;Therapeutic exercise;Manual  techniques;ADLs/Self Care Home Management;Electrical Stimulation;Gait training;Stair training;Functional mobility training;Therapeutic activities;Balance training;Passive range of motion;Dry needling;Energy conservation;Spinal Manipulations;Joint Manipulations;Cryotherapy    PT Next Visit Plan  strengthening, pain control, and promotion of self-management techniques    PT Home Exercise Plan  Medbridge Access Code: [email or web address]    Consulted and Agree with Plan of Care  Patient       Patient will benefit from skilled therapeutic intervention in order to improve the following deficits and impairments:  Decreased strength, Impaired flexibility, Decreased activity tolerance, Impaired perceived functional ability, Pain, Decreased endurance, Difficulty walking, Abnormal gait, Decreased knowledge of use of DME, Decreased skin integrity, Decreased range of motion, Impaired sensation, Improper body mechanics, Obesity, Postural dysfunction, Increased edema, Decreased mobility, Decreased balance, Cardiopulmonary status limiting activity, Decreased coordination  Visit Diagnosis: Muscle weakness (generalized)  Chronic bilateral low back pain, unspecified whether sciatica present  Difficulty in walking, not elsewhere classified  History of falling     Problem List Patient Active Problem List   Diagnosis Date Noted  . Tobacco use disorder 06/15/2019  . Osteopenia 03/03/2019  . Lower extremity edema 07/31/2018  . Pressure injury of skin 07/24/2018  . Surgical site infection 07/23/2018  . Wound of left leg 07/23/2018  . Atherosclerosis of artery of extremity with rest pain ([place]) 07/08/2018  . Shortness of breath 06/02/2018  . Bruit 06/02/2018  . Coronary artery disease of native artery of native heart with stable angina pectoris ([place]) 06/02/2018  . Atherosclerosis of native arteries of extremity with intermittent claudication ([place]) 05/19/2018  . Peripheral arterial disease ([place]) 05/06/2018  . Personal  history of colonic polyps   . Aortic stenosis 12/24/2017  . LVH (left ventricular hypertrophy) 12/24/2017  . Hypertension with heart disease 12/24/2017  . Left atrial dilatation 12/12/2017  . Cardiac murmur 12/12/2017  . Gastrojejunal ulcer 12/04/2017  . Hyperphosphatemia 12/04/2017  . Spinal stenosis of lumbar region 06/18/2017  . Degeneration of lumbar intervertebral disc 06/02/2017  . Assistance needed with transportation 05/26/2017  . Financial difficulties 05/26/2017  . Needs assistance with community resources 05/26/2017  . Moderate recurrent major depression ([place]) 05/26/2017  . Non-healing wound of lower extremity 02/06/2017  . Status post ankle arthrodesis 12/11/2016  . Controlled substance agreement broken 10/11/2016  . Low back pain 09/25/2016  . Osteoarthritis of right subtalar joint 07/11/2016  . Posterior tibial tendinitis of right lower extremity 07/11/2016  . Breast cancer screening 06/18/2016  . Knee pain 04/03/2016  . Hyponatremia 03/01/2016  . High triglycerides 12/24/2015  . Pes planus of both feet 11/16/2015  . Plantar fasciitis of right foot 11/16/2015  . Heel spur 11/16/2015  . Diabetic neuropathy ([place]) 11/16/2015  . Right ankle pain 11/16/2015  . Medication monitoring encounter 11/16/2015  . Chronic pain of multiple joints 08/15/2015  . Abnormal mammogram of right breast 06/19/2015  . Cerumen impaction 03/16/2015  .  Hyperlipidemia 12/14/2014  . History of transfusion of packed RBC 12/14/2014  . Hx of smoking 12/14/2014  . Status post bariatric surgery 12/14/2014  . Morbid obesity ([place]) 12/14/2014  . Chronic radicular lumbar pain 12/14/2014  . History of GI bleed 11/12/2014  . History of small bowel obstruction 09/07/2014    Everlean Alstrom. [name], PT, DPT 07/28/19, 4:23 PM  [facility] [address] [place], [address] Phone: [phone]   Fax:  [phone]  Name: JAIONNA WEISSE MRN: [id number] Date of Birth: March 20, 1946

## 2019-08-02 ENCOUNTER — Encounter: Payer: Self-pay | Admitting: Physical Therapy

## 2019-08-02 ENCOUNTER — Ambulatory Visit: Payer: Medicare Other | Attending: Family Medicine | Admitting: Physical Therapy

## 2019-08-02 ENCOUNTER — Other Ambulatory Visit: Payer: Self-pay

## 2019-08-02 DIAGNOSIS — M545 Low back pain: Secondary | ICD-10-CM | POA: Diagnosis present

## 2019-08-02 DIAGNOSIS — Z9181 History of falling: Secondary | ICD-10-CM

## 2019-08-02 DIAGNOSIS — M6281 Muscle weakness (generalized): Secondary | ICD-10-CM | POA: Diagnosis present

## 2019-08-02 DIAGNOSIS — R262 Difficulty in walking, not elsewhere classified: Secondary | ICD-10-CM | POA: Diagnosis present

## 2019-08-02 DIAGNOSIS — G8929 Other chronic pain: Secondary | ICD-10-CM | POA: Insufficient documentation

## 2019-08-02 NOTE — Therapy (Signed)
[facility] [address], [place], [address] Phone: [phone]   Fax:  [phone]  Physical Therapy Treatment  Patient Details  Name: Andrea Holden MRN: [id number] Date of Birth: [birth year] Referring Provider (PT): Fredderick Severance, NP    Encounter Date: 08/02/2019  PT End of Session - 08/02/19 1307    Visit Number  55    Number of Visits  65    Date for PT Re-Evaluation  08/23/19    Authorization Type  [facility] Medicare reporting period from 07/15/2019    Authorization - Visit Number  5    Authorization - Number of Visits  10    PT Start Time  1194    PT Stop Time  1341    PT Time Calculation (min)  38 min    Equipment Utilized During Treatment  Gait belt    Activity Tolerance  Patient limited by pain;Patient tolerated treatment well;Patient limited by fatigue    Behavior During Therapy  [facility] for tasks assessed/performed   arrived 10 minutes late      Past Medical History:  Diagnosis Date  . Allergy    seasonal allergies  . Anemia 2014   needed 5 units of blood d/t passing out, weak  . Anxiety   . Aortic stenosis 12/24/2017   Echo Aug 2018  . Arthritis   . Asthma    allergy induced asthma  . Cardiac murmur 12/12/2017  . Cataract   . Cataract    left  . Complication of anesthesia    arrhythmia following colonoscopy  . Degenerative disc disease, lumbar   . Degenerative disc disease, lumbar   . Depression   . Diabetes mellitus   . Diabetic neuropathy ([place]) 11/16/2015  . Dysrhythmia    stenosis of left ventricle  . GERD (gastroesophageal reflux disease)   . H/O transfusion    patient was given 5 units of blood while at [place], blood type O+  . History of chicken pox   . History of hiatal hernia   . History of measles, mumps, or rubella   . HOH (hard of hearing)    does not use hearing aides yet  . Hyperlipidemia   . Hypertension   . Irregular heartbeat   . LVH (left ventricular  hypertrophy) 12/24/2017   Echo Aug 2018  . Neuropathy   . Opiate use 11/16/2015  . Peripheral arterial disease ([place]) 05/06/2018   At rest, left > right; refer to vasc  . Pes planus of both feet 11/16/2015  . Plantar fasciitis of right foot 11/16/2015  . Wheezing     Past Surgical History:  Procedure Laterality Date  . ABDOMINAL HYSTERECTOMY    . APPLICATION OF WOUND VAC Left 07/23/2018   Procedure: APPLICATION OF WOUND VAC;  Surgeon: Algernon Huxley, MD;  Location: [facility] ORS;  Service: Vascular;  Laterality: Left;  . banck injections    . CATARACT EXTRACTION W/PHACO Right 05/30/2015   Procedure: CATARACT EXTRACTION PHACO AND INTRAOCULAR LENS PLACEMENT (IOC);  Surgeon: Birder Robson, MD;  Location: [facility] ORS;  Service: Ophthalmology;  Laterality: Right;  [place] 00:57 AP% 20.9 CDE 11.99 fluid pack lot #[id number]  . CHOLECYSTECTOMY  1970  . COLON SURGERY  2013   blocked colon  . COLONOSCOPY WITH PROPOFOL N/A 01/20/2018   Procedure: COLONOSCOPY WITH PROPOFOL;  Surgeon: Lucilla Lame, MD;  Location: [facility] ENDOSCOPY;  Service: Endoscopy;  Laterality: N/A;  . ENDARTERECTOMY FEMORAL Left 07/08/2018  Procedure: ENDARTERECTOMY FEMORAL;  Surgeon: Algernon Huxley, MD;  Location: [facility] ORS;  Service: Vascular;  Laterality: Left;  . EYE SURGERY Right    cataract surgery  . FOOT FUSION Right 2018   metal in foot  . GALLBLADDER SURGERY  1970  . GASTRIC BYPASS  2010   lost 178 lbs and regained 40 lbs last few years  . HUMERUS FRACTURE SURGERY Right    metal plate with screws  . internal bleeding  2016   ulcer in past  . LOWER EXTREMITY ANGIOGRAM Left 07/08/2018   Procedure: LOWER EXTREMITY ANGIOGRAM;  Surgeon: Algernon Huxley, MD;  Location: [facility] ORS;  Service: Vascular;  Laterality: Left;  . LOWER EXTREMITY ANGIOGRAPHY Left 05/27/2018   Procedure: LOWER EXTREMITY ANGIOGRAPHY;  Surgeon: Algernon Huxley, MD;  Location: [place] CV LAB;  Service: Cardiovascular;  Laterality: Left;  . LOWER EXTREMITY ANGIOGRAPHY  Right 12/07/2018   Procedure: LOWER EXTREMITY ANGIOGRAPHY;  Surgeon: Algernon Huxley, MD;  Location: [facility];  Service: Cardiovascular;  Laterality: Right;  . LOWER EXTREMITY ANGIOGRAPHY Left 05/10/2019   Procedure: LOWER EXTREMITY ANGIOGRAPHY;  Surgeon: Algernon Huxley, MD;  Location: [facility] CV LAB;  Service: Cardiovascular;  Laterality: Left;  . MOUTH SURGERY     root canals and crowns and extractions  . OVARY SURGERY    . SKIN GRAFT Right 2018   RT foot. foot has been rebuilt.  it is full of metal  . TENOTOMY ACHILLES TENDON Right    Percuntaneous. metal in foot  . TONSILLECTOMY    . WOUND DEBRIDEMENT Left 07/23/2018   Procedure: DEBRIDEMENT WOUND;  Surgeon: Algernon Huxley, MD;  Location: [facility] ORS;  Service: Vascular;  Laterality: Left;    There were no vitals filed for this visit.  Subjective Assessment - 08/02/19 1305    Subjective  Patient reports her pain is 4.5/10 today in the R low back and right knee and heel. States she felt pretty good following last treatment session and has tried out the band exercises at home and was able to complete them successfully.    Pertinent History  Patient is a 74 y.o. female who presents to outpatient physical therapy with a referral for medical diagnosis of degeneration of lumbar intervertebral disc. This patient's chief complaints consist of chronic low back pain and foot pain, weakness, stiffness, leading to the following functional deficits: difficulty with with basic ADLs, IADLs, ambulation. She used to get injections to help control back pain but it has recently worsened due to being unable to get injections during [place] pandemic. The patient has been informed of current processes in place at [facility]patient [facility] to protect patients from Covid-19 exposure including social distancing, schedule modifications, and new cleaning procedures. After discussing their particular risk with a therapist based on the patient's personal risk factors, the  patient has decided to proceed with in-person therapy. Pt is scheduled for R LE angiography on 12/07/2018. She has recently started smoking again with the stress of COVID19 pandemic and feels strongly she needs to stop. Surgeon has stated in the past he will not complete surgery if she is smoking.  Relevant past medical history and comorbidities include underwent extensive vascular surgeries in Jan 2020 to improve blood flow, similar surgery for R leg scheduled 12/07/2018. Has a history of left carotid artery stenosis, but not enough to have surgery, has a history of multiple arterial surgeries, history of R cataract surgery, colon surgery, cholecystectomy, R foot skin graft, ovary surgery,  gastric bypass, R foot fusion, abdominal hysterectomy; current smoker, plantar fasciitis, peripheral arterial disease, LVH, diabetes mellitus with neuropathy, irregular heartbeat, hiatal hernia, GERD, stenosis of left heart ventricle, depression, lumbar degenerative disc disease, cardiac murmur, asthma, anxiety, obesity, HTN, and seasonal allergies She has been advised not to lift over 10 pounds by physician    Limitations  Sitting;Standing;Walking;House hold activities;Lifting    How long can you sit comfortably?  > 1 hour in almost any chair    How long can you stand comfortably?  10-15 min while cooking    How long can you walk comfortably?  2 times to the mailbox and back (120 feet to mailbox) with single point cane. Can ambulate 1 hour with rollator with seated resting breaks.    Diagnostic tests  MRI lumbar spine report 06/14/2017: "IMPRESSION: 1. Stable degenerative of lumbar spondylosis and scoliosis with multilevel disc disease and facet disease. 2. Stable right foraminal stenosis at L5-S1 due to right-sided disc    Patient Stated Goals  patient would like to get back to PLOF, walking further, be better able to participate in dog showing and trips    Currently in Pain?  Yes    Pain Score  4     Pain Location  Back     Pain Onset  More than a month ago         TREATMENT  Patient goal until next session: 2 laps walking around outside of home each day.practicing HEP.  Therapeutic exercise:to centralize symptoms and improve ROM, strength, muscular endurance, and activity tolerance required for successful completion of functional activities.  LargeNuSteplevel1-2(level 2 from 2 min on)usingBLEs.Seatsetting 7-8. For improved extremity mobility, muscular endurance, and activity tolerance; and to induce the analgesic effect of aerobic exercise, stimulate improved joint nutrition, and prepare body structures and systems for following interventions.during subjective exam. Average SPM =91.x61mn during subjective exam.  Sit <> stand from20.5inchseat(height of green chair with airex)with no UE support, cuing for glute contraction.3x10.R knee painfulLegs getting shaky last three. (last two sets with aeromat under feet)  Standingexercises on aeromat with no UE support to improve corestrength and balance (SBA for safety):  - B shoulder flexion,x15(3#) - B shoulder abduction, x15 (3#) - B horizontal shoulder abduction, yellow theraband loop,x15  - pallof press,redtheraband loop, x15each side - standing row 2 x 30 with green theraband(2nd set patient holding, clinician pulling as a balance perturbation).   Seatedexercises with trunk/legsunsupported and no UE support to improve corestrength:  -standing marching x 1 min  Cuing for purpose of exercise, translation to personal functional goals.Encouragement to continue HEP. Required cuing to stay on task throughout session.   HOME EXERCISE PROGRAM (pt has 2# DB at home) Access Code: [email or web address]: [email or web address] Date: 07/26/2019  Prepared by: SRosita Kea  Exercises Walking - 1.5 to the mailbox and back - 1.5 2 minutes in the house - 1x daily - 7x weekly Seated Shoulder Flexion  with Dumbbells - 2 sets - 5 reps - 1 second hold - 1 every other day Seated Shoulder Abduction with Dumbbells - Thumbs Up - 2 sets - 15 reps - 1 second hold - 1 every other day Seated Shoulder Horizontal Abduction with Resistance - 2 sets - 15 reps - 1 second hold - 1 every other day Seated Anti-Rotation Press with Anchored Resistance - 2 sets - 15 reps - 1 second hold - 1 every other day Seated March - 2 reps - 1 minute - 1 every other day Sit to  Stand without Arm Support - 2-3 sets - 10 reps - 1 every other day Sidelying Hip Abduction - 2-3 sets - 10-15 reps - 1 second hold - 1 every other day Bridge - 2-3 sets - 5-10 reps - 1-10 seconds hold - 1 Every Other Day Active Straight Leg Raise with Quad Set - 2-3 sets - 10-15 reps - 1 every other day Narrow Stance with Head Rotations and Counter Support - 2 sets - 20 reps - 1 every other day Narrow Stance with Head Nods and Counter Support - 2 sets - 20 reps - 1 every other day Standing Tandem Balance with Counter Support - 2 reps - 45 seconds hold - 1 every other day    PT Education - 08/02/19 1307    Education provided  Yes    Education Details  Exercise purpose/form. Self management techniques    Person(s) Educated  Patient    Methods  Explanation;Demonstration;Tactile cues;Verbal cues    Comprehension  Verbalized understanding;Returned demonstration;Verbal cues required;Need further instruction;Tactile cues required       PT Short Term Goals - 05/31/19 2104      PT SHORT TERM GOAL #1   Title  Be independent with initial home exercise program for self-management of symptoms.    Baseline  Initial HEP provided at IE (11/17/2018); was completing until vascular procedure, re-instated 12/15/2018 (12/15/2018); currently participating (01/26/2019); Partially doing HEP due the patient's recent health condition/weakness(03/23/2019); Patient has been doing her HEP every other day. 04/20/2019; has returned to completing her walking HEP as prescribed  (05/31/2019);    Time  2    Period  Weeks    Status  Achieved    Target Date  05/04/19        PT Long Term Goals - 07/08/19 1723      PT LONG TERM GOAL #1   Title  Be independent with a long-term home exercise program for self-management of symptoms.     Baseline  Initial HEP provided at IE (11/17/2018); was doing better before vascular surgery, is working at returning to [facility] since procedure (12/15/2018); currently participating but not assigned final long term HEP yet (01/26/2019); currently participating but not assigned final long term HEP yet (03/23/2019); HEP progressing (04/20/2019; 05/31/2019; 07/08/2019);    Time  12    Period  Weeks    Status  Partially Met    Target Date  08/23/19      PT LONG TERM GOAL #2   Title  Patient will improve 6MWT to equal or greater than 1000 feet with LRAD to demonstrate improved activity tolerance for community ambulation.     Baseline  6 Minute Walk Test: 200 feet with SPC and supervision. Stopped at 1:43 due to fatigue and leg pain. (11/17/2018); 457 feet SPC, on seated and one standing break (12/15/2018); 547 with SPC, 2 seated and one standing rest break (01/26/2019); 468 feet with SPC, 2 seated breaks(03/23/2019); 638 feet with SPC, 2 seated breaks; 747 feet with SPC and 2 standing rest breaks (05/31/2019); 628 feet with SPC and 3 standing rest breaks (07/08/2019);    Time  12    Period  Weeks    Status  Partially Met    Target Date  08/23/19      PT LONG TERM GOAL #3   Title  Patient will demonstrate improved 5 Times Sit to Stand test to 11 seconds from chair height with no UE support to demonstrate improvement in LE power and functional strength for daily  activities.    Baseline  5 Time Sit to Stand: 27 seconds from chair height no UE support (11/17/2018); 18 seconds, no UE support from chair-high plinth (12/15/2018); 16 seconds with no UE support from chair (01/26/2019); 17.45s with lack of knee extension in standing(03/23/2019); 14.03s without BUEs  help (04/20/2019); 16 seconds without BUE help from 18.5 inch seat (05/31/2019); 16 seconds, no UE support from 18.5 inch plinth (07/08/2019);    Time  12    Period  Weeks    Status  Partially Met      PT LONG TERM GOAL #4   Title  Patient will ambulate faster than 1.35ms on the 10 Meter Walk Test to improve ability to cross street safey for community participation(05/21/2019); 247m with SPC (05/31/2019);    Baseline  testing deferred to next session (11/17/2018); 1.37 m/sec with SPC (11/24/2018); 1.3 m/sec with SPC (12/15/2018; 01/26/2019); 0.4748m(03/23/2019); 0.98 m/sec with SPC (04/20/2019); 1 m/sec (05/31/2019); 0.75 m/sec (07/08/2019);    Time  12    Period  Weeks    Status  Partially Met    Target Date  08/23/19      PT LONG TERM GOAL #5   Title  Patient will score equal or greater than  25/30 on the Functional Gait Assessment to demonstrate low fall risk to improve her ability to participate safely in community activities.     Baseline  testing deferred to next session (11/17/2018); 11/30 high fall risk (11/24/2018); unable to measure due to time limitation (12/15/2018); 13/30 high fall risk (12/17/2018); 14/30 high fall risk (01/26/2019); 14/30 high fall risk(03/23/2019); 17/30 high fall risk (04/20/2019); 21/30 moderate fall risk (05/31/2019); 20/30 moderate fall risk (07/08/2019);    Time  12    Period  Weeks    Status  Partially Met    Target Date  08/23/19      PT LONG TERM GOAL #6   Title  Reduce pain to equal or less than 4/10 with functional activities to allow patient to complete valued functional tasks such as caring for dogs, grocery shopping, walking with less difficulty.(03/23/2019);    Baseline  8/10 (01/26/2019); 6/10 (03/23/2019); 4/10 (04/20/2019); 4/10 (05/31/2019); 5/10 (07/08/2019);    Time  12    Period  Weeks    Status  Partially Met    Target Date  08/23/19            Plan - 08/02/19 1310    Clinical Impression Statement  Patient tolerated treatment well overall  today. Focused on continuing LE and core/UE exercises to improve back pain and activity tolerance. Continues to demonstrate improved activity tolerance on nustep and overall participation in exercise and HEP. Continue to focus on improving independence with exercises in HEP. Has not yet returned to PLOF and continues to have functional limitations and impairments limiting her QOL. Patient would benefit from continued management of limiting condition by skilled physical therapist to address remaining impairments and functional limitations to work towards stated goals and return to PLOF or maximal functional independence.    Personal Factors and Comorbidities  Age;Comorbidity 3+;Fitness;Time since onset of injury/illness/exacerbation;Past/Current Experience;Other    Comorbidities  underwent extensive vascular surgeries in Jan 2020 to improve blood flow, similar surgery for R leg scheduled 12/07/2018. Has a history of left carotid artery stenosis, but not enough to have surgery, has a history of multiple arterial surgeries, history of R cataract surgery, colon surgery, cholecystectomy, R foot skin graft, ovary surgery, gastric bypass, R foot fusion, abdominal hysterectomy; current smoker,  plantar fasciitis, peripheral arterial disease, LVH, diabetes mellitus with neuropathy, irregular heartbeat, hiatal hernia, GERD, stenosis of left heart ventricle, depression, lumbar degenerative disc disease, cardiac murmur, asthma, anxiety, obesity, HTN, and seasonal allergies She has been advised not to lift over 10 pounds by physician.      Examination-Activity Limitations  Bathing;Carry;Lift;Sit;Stand;Locomotion Level;Toileting;Dressing;Squat;Transfers;Stairs;Hygiene/Grooming;Other    Examination-Participation Restrictions  Community Activity;Volunteer;Cleaning;Interpersonal Relationship;Yard Work;Other    Rehab Potential  Fair    Clinical Impairments Affecting Rehab Potential  (+) motivated (-) muliple co morbidities,  chronic condition    PT Frequency  2x / week    PT Duration  12 weeks    PT Treatment/Interventions  Moist Heat;Patient/family education;Neuromuscular re-education;Therapeutic exercise;Manual techniques;ADLs/Self Care Home Management;Electrical Stimulation;Gait training;Stair training;Functional mobility training;Therapeutic activities;Balance training;Passive range of motion;Dry needling;Energy conservation;Spinal Manipulations;Joint Manipulations;Cryotherapy    PT Next Visit Plan  strengthening, pain control, and promotion of self-management techniques    PT Home Exercise Plan  Medbridge Access Code: [email or web address]    Consulted and Agree with Plan of Care  Patient       Patient will benefit from skilled therapeutic intervention in order to improve the following deficits and impairments:  Decreased strength, Impaired flexibility, Decreased activity tolerance, Impaired perceived functional ability, Pain, Decreased endurance, Difficulty walking, Abnormal gait, Decreased knowledge of use of DME, Decreased skin integrity, Decreased range of motion, Impaired sensation, Improper body mechanics, Obesity, Postural dysfunction, Increased edema, Decreased mobility, Decreased balance, Cardiopulmonary status limiting activity, Decreased coordination  Visit Diagnosis: Muscle weakness (generalized)  Chronic bilateral low back pain, unspecified whether sciatica present  Difficulty in walking, not elsewhere classified  History of falling     Problem List Patient Active Problem List   Diagnosis Date Noted  . Tobacco use disorder 06/15/2019  . Osteopenia 03/03/2019  . Lower extremity edema 07/31/2018  . Pressure injury of skin 07/24/2018  . Surgical site infection 07/23/2018  . Wound of left leg 07/23/2018  . Atherosclerosis of artery of extremity with rest pain ([place]) 07/08/2018  . Shortness of breath 06/02/2018  . Bruit 06/02/2018  . Coronary artery disease of native artery of native heart with stable  angina pectoris ([place]) 06/02/2018  . Atherosclerosis of native arteries of extremity with intermittent claudication ([place]) 05/19/2018  . Peripheral arterial disease ([place]) 05/06/2018  . Personal history of colonic polyps   . Aortic stenosis 12/24/2017  . LVH (left ventricular hypertrophy) 12/24/2017  . Hypertension with heart disease 12/24/2017  . Left atrial dilatation 12/12/2017  . Cardiac murmur 12/12/2017  . Gastrojejunal ulcer 12/04/2017  . Hyperphosphatemia 12/04/2017  . Spinal stenosis of lumbar region 06/18/2017  . Degeneration of lumbar intervertebral disc 06/02/2017  . Assistance needed with transportation 05/26/2017  . Financial difficulties 05/26/2017  . Needs assistance with community resources 05/26/2017  . Moderate recurrent major depression ([place]) 05/26/2017  . Non-healing wound of lower extremity 02/06/2017  . Status post ankle arthrodesis 12/11/2016  . Controlled substance agreement broken 10/11/2016  . Low back pain 09/25/2016  . Osteoarthritis of right subtalar joint 07/11/2016  . Posterior tibial tendinitis of right lower extremity 07/11/2016  . Breast cancer screening 06/18/2016  . Knee pain 04/03/2016  . Hyponatremia 03/01/2016  . High triglycerides 12/24/2015  . Pes planus of both feet 11/16/2015  . Plantar fasciitis of right foot 11/16/2015  . Heel spur 11/16/2015  . Diabetic neuropathy ([place]) 11/16/2015  . Right ankle pain 11/16/2015  . Medication monitoring encounter 11/16/2015  . Chronic pain of multiple joints 08/15/2015  . Abnormal mammogram of right breast  06/19/2015  . Cerumen impaction 03/16/2015  . Hyperlipidemia 12/14/2014  . History of transfusion of packed RBC 12/14/2014  . Hx of smoking 12/14/2014  . Status post bariatric surgery 12/14/2014  . Morbid obesity ([place]) 12/14/2014  . Chronic radicular lumbar pain 12/14/2014  . History of GI bleed 11/12/2014  . History of small bowel obstruction 09/07/2014    Everlean Alstrom. [name], PT, DPT 08/02/19,  1:42 PM  [facility] [address] [place], [address] Phone: [phone]   Fax:  [phone]  Name: Andrea Holden MRN: [id number] Date of Birth: 08-20-1945

## 2019-08-03 ENCOUNTER — Encounter: Payer: Self-pay | Admitting: Family Medicine

## 2019-08-04 ENCOUNTER — Telehealth: Payer: Self-pay | Admitting: Physical Therapy

## 2019-08-04 ENCOUNTER — Ambulatory Visit: Payer: Medicare Other | Admitting: Physical Therapy

## 2019-08-04 NOTE — Telephone Encounter (Signed)
Called patient when she did not show up to her appointment today. Patient answered and said she has been sick since last night with diarrhea and vomiting. She thinks it is related to something she ate a couple of days ago, although she felt okay a day in between. I informed her we needed to cancel her appointment tomorrow as well because she needs to not be vomiting for at least 24 hours prior to coming in due to risk of infecting others if she has a virus. Patient agreed to come back to her next scheduled appointment Monday 08/09/2019 at 1pm. Recommended she work on her HEP if she starts feeling better before then. Asked admin staff to cancel tomorrow's appt.   Luretha Murphy. Ilsa Iha, PT, DPT 08/04/19, 1:27 PM

## 2019-08-05 ENCOUNTER — Ambulatory Visit: Payer: Medicare Other | Admitting: Physical Therapy

## 2019-08-09 ENCOUNTER — Encounter: Payer: Self-pay | Admitting: Physical Therapy

## 2019-08-09 ENCOUNTER — Other Ambulatory Visit: Payer: Self-pay

## 2019-08-09 ENCOUNTER — Ambulatory Visit: Payer: Medicare Other | Admitting: Physical Therapy

## 2019-08-09 DIAGNOSIS — M6281 Muscle weakness (generalized): Secondary | ICD-10-CM

## 2019-08-09 DIAGNOSIS — G8929 Other chronic pain: Secondary | ICD-10-CM

## 2019-08-09 DIAGNOSIS — Z9181 History of falling: Secondary | ICD-10-CM

## 2019-08-09 DIAGNOSIS — M545 Low back pain, unspecified: Secondary | ICD-10-CM

## 2019-08-09 DIAGNOSIS — R262 Difficulty in walking, not elsewhere classified: Secondary | ICD-10-CM

## 2019-08-09 NOTE — Therapy (Signed)
[facility] [address] [place], [address] Phone: [phone]   Fax:  [phone]  Physical Therapy Treatment  Patient Details  Name: Andrea Holden MRN: [id number] Date of Birth: 02-15-1946 Referring Provider (PT): Fredderick Severance, NP    Encounter Date: 08/09/2019  PT End of Session - 08/09/19 1310    Visit Number  56    Number of Visits  65    Date for PT Re-Evaluation  08/23/19    Authorization Type  [facility] Medicare reporting period from 07/15/2019    Authorization - Visit Number  6    Authorization - Number of Visits  10    PT Start Time  7169    PT Stop Time  1345    PT Time Calculation (min)  41 min    Equipment Utilized During Treatment  Gait belt    Activity Tolerance  Patient limited by pain;Patient tolerated treatment well;Patient limited by fatigue    Behavior During Therapy  [facility] for tasks assessed/performed   arrived 10 minutes late      Past Medical History:  Diagnosis Date  . Allergy    seasonal allergies  . Anemia 2014   needed 5 units of blood d/t passing out, weak  . Anxiety   . Aortic stenosis 12/24/2017   Echo Aug 2018  . Arthritis   . Asthma    allergy induced asthma  . Cardiac murmur 12/12/2017  . Cataract   . Cataract    left  . Complication of anesthesia    arrhythmia following colonoscopy  . Degenerative disc disease, lumbar   . Degenerative disc disease, lumbar   . Depression   . Diabetes mellitus   . Diabetic neuropathy ([place]) 11/16/2015  . Dysrhythmia    stenosis of left ventricle  . GERD (gastroesophageal reflux disease)   . H/O transfusion    patient was given 5 units of blood while at [facility], blood type O+  . History of chicken pox   . History of hiatal hernia   . History of measles, mumps, or rubella   . HOH (hard of hearing)    does not use hearing aides yet  . Hyperlipidemia   . Hypertension   . Irregular heartbeat   . LVH (left ventricular  hypertrophy) 12/24/2017   Echo Aug 2018  . Neuropathy   . Opiate use 11/16/2015  . Peripheral arterial disease ([place]) 05/06/2018   At rest, left > right; refer to vasc  . Pes planus of both feet 11/16/2015  . Plantar fasciitis of right foot 11/16/2015  . Wheezing     Past Surgical History:  Procedure Laterality Date  . ABDOMINAL HYSTERECTOMY    . APPLICATION OF WOUND VAC Left 07/23/2018   Procedure: APPLICATION OF WOUND VAC;  Surgeon: Algernon Huxley, MD;  Location: [facility] ORS;  Service: Vascular;  Laterality: Left;  . banck injections    . CATARACT EXTRACTION W/PHACO Right 05/30/2015   Procedure: CATARACT EXTRACTION PHACO AND INTRAOCULAR LENS PLACEMENT (IOC);  Surgeon: Birder Robson, MD;  Location: [facility] ORS;  Service: Ophthalmology;  Laterality: Right;  [place] 00:57 AP% 20.9 CDE 11.99 fluid pack lot #[id number]  . CHOLECYSTECTOMY  1970  . COLON SURGERY  2013   blocked colon  . COLONOSCOPY WITH PROPOFOL N/A 01/20/2018   Procedure: COLONOSCOPY WITH PROPOFOL;  Surgeon: Lucilla Lame, MD;  Location[facility] END[facility];  Service: Endoscopy;  Laterality: N/A;  . ENDARTERECTOMY FEMORAL Left 07/08/2018  Procedure: ENDARTERECTOMY FEMORAL;  Surgeon: Algernon Huxley, MD;  Location: [facility] ORS;  Service: Vascular;  Laterality: Left;  . EYE SURGERY Right    cataract surgery  . FOOT FUSION Right 2018   metal in foot  . GALLBLADDER SURGERY  1970  . GASTRIC BYPASS  2010   lost 178 lbs and regained 40 lbs last few years  . HUMERUS FRACTURE SURGERY Right    metal plate with screws  . internal bleeding  2016   ulcer in past  . LOWER EXTREMITY ANGIOGRAM Left 07/08/2018   Procedure: LOWER EXTREMITY ANGIOGRAM;  Surgeon: Algernon Huxley, MD;  Location: [facility] ORS;  Service: Vascular;  Laterality: Left;  . LOWER EXTREMITY ANGIOGRAPHY Left 05/27/2018   Procedure: LOWER EXTREMITY ANGIOGRAPHY;  Surgeon: Algernon Huxley, MD;  Location: [place] CV LAB;  Service: Cardiovascular;  Laterality: Left;  . LOWER EXTREMITY ANGIOGRAPHY  Right 12/07/2018   Procedure: LOWER EXTREMITY ANGIOGRAPHY;  Surgeon: Algernon Huxley, MD;  Location: [place] CV LAB;  Service: Cardiovascular;  Laterality: Right;  . LOWER EXTREMITY ANGIOGRAPHY Left 05/10/2019   Procedure: LOWER EXTREMITY ANGIOGRAPHY;  Surgeon: Algernon Huxley, MD;  Location: [place] CV LAB;  Service: Cardiovascular;  Laterality: Left;  . MOUTH SURGERY     root canals and crowns and extractions  . OVARY SURGERY    . SKIN GRAFT Right 2018   RT foot. foot has been rebuilt.  it is full of metal  . TENOTOMY ACHILLES TENDON Right    Percuntaneous. metal in foot  . TONSILLECTOMY    . WOUND DEBRIDEMENT Left 07/23/2018   Procedure: DEBRIDEMENT WOUND;  Surgeon: Algernon Huxley, MD;  Location: [facility] ORS;  Service: Vascular;  Laterality: Left;    There were no vitals filed for this visit.  Subjective Assessment - 08/09/19 1306    Subjective  Patient reports her pain is elevated today 6/10 in the right low back and 5.5/10 in the back of the right knee. She states she is unsure what brought this on but she noticed it really getting worse around 4pm yesterday. She missed her 2nd appointment last week due to GI upset with vomiting and diarrhea that has gradually gotten better. She has been doing some of her HEP here and there. she is very frustrated she has not been able to get her COVID 19 vaccine yet. She is on two wait lists and all of her frends have gotten it now. She is having phone issues.    Pertinent History  Patient is a 74 y.o. female who presents to outpatient physical therapy with a referral for medical diagnosis of degeneration of lumbar intervertebral disc. This patient's chief complaints consist of chronic low back pain and foot pain, weakness, stiffness, leading to the following functional deficits: difficulty with with basic ADLs, IADLs, ambulation. She used to get injections to help control back pain but it has recently worsened due to being unable to get injections during  [place] pandemic. The patient has been informed of current processes in place at [facility] to protect patients from Covid-19 exposure including social distancing, schedule modifications, and new cleaning procedures. After discussing their particular risk with a therapist based on the patient's personal risk factors, the patient has decided to proceed with in-person therapy. Pt is scheduled for R LE angiography on 12/07/2018. She has recently started smoking again with the stress of COVID19 pandemic and feels strongly she needs to stop. Surgeon has stated in the past he will not complete  surgery if she is smoking.  Relevant past medical history and comorbidities include underwent extensive vascular surgeries in Jan 2020 to improve blood flow, similar surgery for R leg scheduled 12/07/2018. Has a history of left carotid artery stenosis, but not enough to have surgery, has a history of multiple arterial surgeries, history of R cataract surgery, colon surgery, cholecystectomy, R foot skin graft, ovary surgery, gastric bypass, R foot fusion, abdominal hysterectomy; current smoker, plantar fasciitis, peripheral arterial disease, LVH, diabetes mellitus with neuropathy, irregular heartbeat, hiatal hernia, GERD, stenosis of left heart ventricle, depression, lumbar degenerative disc disease, cardiac murmur, asthma, anxiety, obesity, HTN, and seasonal allergies She has been advised not to lift over 10 pounds by physician    Limitations  Sitting;Standing;Walking;House hold activities;Lifting    How long can you sit comfortably?  > 1 hour in almost any chair    How long can you stand comfortably?  10-15 min while cooking    How long can you walk comfortably?  2 times to the mailbox and back (120 feet to mailbox) with single point cane. Can ambulate 1 hour with rollator with seated resting breaks.    Diagnostic tests  MRI lumbar spine report 06/14/2017: "IMPRESSION: 1. Stable degenerative of lumbar spondylosis and  scoliosis with multilevel disc disease and facet disease. 2. Stable right foraminal stenosis at L5-S1 due to right-sided disc    Patient Stated Goals  patient would like to get back to PLOF, walking further, be better able to participate in dog showing and trips    Currently in Pain?  Yes    Pain Score  6     Pain Location  Back   leg   Pain Orientation  Right    Pain Descriptors / Indicators  Aching    Pain Onset  More than a month ago       TREATMENT  Patient goal until next session: 2 laps walking around outside of home each day.practicing HEP.  Therapeutic exercise:to centralize symptoms and improve ROM, strength, muscular endurance, and activity tolerance required for successful completion of functional activities.  [email or web address].[email or web address]. For improved extremity mobility, muscular endurance, and activity tolerance; and to induce the analgesic effect of aerobic exercise, stimulate improved joint nutrition, and prepare body structures and systems for following interventions.during subjective exam. Average SPM =69 only able to go 4:19 during subjective exam before stopping due to anterior R knee pain (not back or posterior knee).   Mat Exercises:   Supine abdominal brace with active stragiht leg raise.R (3x5) L (x10, 2x5). R leg painful  sidelying hip abduction2x10 each side withmanual support to maintain proper form.   Supine bridges with isometric hip abduction against manual resistance2x15   Prone hip extension with knee flexed to improve glute activation and allow her to stretch her back in a more extended position. 2x10 each side.  Min A for supine <> sit and rolling to get to each position.   Manual therapy: to reduce pain and tissue tension, improve range of motion, neuromodulation, in order to promote improved ability to complete functional activities. - prone STM to upper back, bilateral upper traps, lumbar region to decrease pain and  promote relaxation while stretching lower back into slight extension for pain contro.   Cuing for purpose of exercise, translation to personal functional goals.Encouragement to continue HEP. Required cuing to stay on task throughout session.   HOME EXERCISE PROGRAM (pt has 2# DB at home) Access Code: [email or web address] URL: [email or web address] Date: 07/26/2019  Prepared by: Rosita Kea  Exercises Walking - 1.5 to the mailbox and back - 1.5 2 minutes in the house - 1x daily - 7x weekly Seated Shoulder Flexion with Dumbbells - 2 sets - 5 reps - 1 second hold - 1 every other day Seated Shoulder Abduction with Dumbbells - Thumbs Up - 2 sets - 15 reps - 1 second hold - 1 every other day Seated Shoulder Horizontal Abduction with Resistance - 2 sets - 15 reps - 1 second hold - 1 every other day Seated Anti-Rotation Press with Anchored Resistance - 2 sets - 15 reps - 1 second hold - 1 every other day Seated March - 2 reps - 1 minute - 1 every other day Sit to Stand without Arm Support - 2-3 sets - 10 reps - 1 every other day Sidelying Hip Abduction - 2-3 sets - 10-15 reps - 1 second hold - 1 every other day Bridge - 2-3 sets - 5-10 reps - 1-10 seconds hold - 1 Every Other Day Active Straight Leg Raise with Quad Set - 2-3 sets - 10-15 reps - 1 every other day Narrow Stance with Head Rotations and Counter Support - 2 sets - 20 reps - 1 every other day Narrow Stance with Head Nods and Counter Support - 2 sets - 20 reps - 1 every other day Standing Tandem Balance with Counter Support - 2 reps - 45 seconds hold - 1 every other day    PT Education - 08/09/19 1310    Education provided  Yes    Education Details  Exercise purpose/form. Self management techniques    Person(s) Educated  Patient    Methods  Explanation;Demonstration;Tactile cues;Verbal cues    Comprehension  Verbalized understanding;Returned demonstration;Verbal cues required;Tactile cues required;Need further  instruction       PT Short Term Goals - 05/31/19 2104      PT SHORT TERM GOAL #1   Title  Be independent with initial home exercise program for self-management of symptoms.    Baseline  Initial HEP provided at [facility] (11/17/2018); was completing until vascular procedure, re-instated 12/15/2018 (12/15/2018); currently participating (01/26/2019); Partially doing HEP due the patient's recent health condition/weakness(03/23/2019); Patient has been doing her HEP every other day. 04/20/2019; has returned to completing her walking HEP as prescribed (05/31/2019);    Time  2    Period  Weeks    Status  Achieved    Target Date  05/04/19        PT Long Term Goals - 07/08/19 1723      PT LONG TERM GOAL #1   Title  Be independent with a long-term home exercise program for self-management of symptoms.     Baseline  Initial HEP provided at [facility] (11/17/2018); was doing better before vascular surgery, is working at returning to [facility] since procedure (12/15/2018); currently participating but not assigned final long term HEP yet (01/26/2019); currently participating but not assigned final long term HEP yet (03/23/2019); HEP progressing (04/20/2019; 05/31/2019; 07/08/2019);    Time  12    Period  Weeks    Status  Partially Met    Target Date  08/23/19      PT LONG TERM GOAL #2   Title  Patient will improve 6MWT to equal or greater than 1000 feet with LRAD to demonstrate improved activity tolerance for community ambulation.     Baseline  6 Minute Walk Test: 200 feet with SPC and supervision. Stopped at 1:43 due to fatigue and leg pain. (11/17/2018); 457 feet SPC, on  seated and one standing break (12/15/2018); 547 with SPC, 2 seated and one standing rest break (01/26/2019); 468 feet with SPC, 2 seated breaks(03/23/2019); 638 feet with SPC, 2 seated breaks; 747 feet with SPC and 2 standing rest breaks (05/31/2019); 628 feet with SPC and 3 standing rest breaks (07/08/2019);    Time  12    Period  Weeks    Status  Partially Met     Target Date  08/23/19      PT LONG TERM GOAL #3   Title  Patient will demonstrate improved 5 Times Sit to Stand test to 11 seconds from chair height with no UE support to demonstrate improvement in LE power and functional strength for daily activities.    Baseline  5 Time Sit to Stand: 27 seconds from chair height no UE support (11/17/2018); 18 seconds, no UE support from chair-high plinth (12/15/2018); 16 seconds with no UE support from chair (01/26/2019); 17.45s with lack of knee extension in standing(03/23/2019); 14.03s without BUEs help (04/20/2019); 16 seconds without BUE help from 18.5 inch seat (05/31/2019); 16 seconds, no UE support from 18.5 inch plinth (07/08/2019);    Time  12    Period  Weeks    Status  Partially Met      PT LONG TERM GOAL #4   Title  Patient will ambulate faster than 1.3ms on the 10 Meter Walk Test to improve ability to cross street safey for community participation(05/21/2019); 263m with SPC (05/31/2019);    Baseline  testing deferred to next session (11/17/2018); 1.37 m/sec with SPC (11/24/2018); 1.3 m/sec with SPC (12/15/2018; 01/26/2019); 0.4752m(03/23/2019); 0.98 m/sec with SPC (04/20/2019); 1 m/sec (05/31/2019); 0.75 m/sec (07/08/2019);    Time  12    Period  Weeks    Status  Partially Met    Target Date  08/23/19      PT LONG TERM GOAL #5   Title  Patient will score equal or greater than  25/30 on the Functional Gait Assessment to demonstrate low fall risk to improve her ability to participate safely in community activities.     Baseline  testing deferred to next session (11/17/2018); 11/30 high fall risk (11/24/2018); unable to measure due to time limitation (12/15/2018); 13/30 high fall risk (12/17/2018); 14/30 high fall risk (01/26/2019); 14/30 high fall risk(03/23/2019); 17/30 high fall risk (04/20/2019); 21/30 moderate fall risk (05/31/2019); 20/30 moderate fall risk (07/08/2019);    Time  12    Period  Weeks    Status  Partially Met    Target Date  08/23/19       PT LONG TERM GOAL #6   Title  Reduce pain to equal or less than 4/10 with functional activities to allow patient to complete valued functional tasks such as caring for dogs, grocery shopping, walking with less difficulty.(03/23/2019);    Baseline  8/10 (01/26/2019); 6/10 (03/23/2019); 4/10 (04/20/2019); 4/10 (05/31/2019); 5/10 (07/08/2019);    Time  12    Period  Weeks    Status  Partially Met    Target Date  08/23/19            Plan - 08/09/19 1913    Clinical Impression Statement  Patient tolerated treatment fair today and was limited by pain in her back, right knee, and in general. She seemed quite discouraged and frustrated with feeling worse today and some life circumstances/activities she has been having difficulty getting done, which could be contributing to her pain. Moved her into prone position which was well tolerated  in the low back and leg but she had difficulty with due to upper back pain. Provided some soft tissue work for pain relief with mild success. Patient continues to be limited by pain, poor activiyt tolerance, etc that limits her functional mobility. Patient would benefit from continued management of limiting condition by skilled physical therapist to address remaining impairments and functional limitations to work towards stated goals and return to PLOF or maximal functional independence.    Personal Factors and Comorbidities  Age;Comorbidity 3+;Fitness;Time since onset of injury/illness/exacerbation;Past/Current Experience;Other    Comorbidities  underwent extensive vascular surgeries in Jan 2020 to improve blood flow, similar surgery for R leg scheduled 12/07/2018. Has a history of left carotid artery stenosis, but not enough to have surgery, has a history of multiple arterial surgeries, history of R cataract surgery, colon surgery, cholecystectomy, R foot skin graft, ovary surgery, gastric bypass, R foot fusion, abdominal hysterectomy; current smoker, plantar fasciitis,  peripheral arterial disease, LVH, diabetes mellitus with neuropathy, irregular heartbeat, hiatal hernia, GERD, stenosis of left heart ventricle, depression, lumbar degenerative disc disease, cardiac murmur, asthma, anxiety, obesity, HTN, and seasonal allergies She has been advised not to lift over 10 pounds by physician.      Examination-Activity Limitations  Bathing;Carry;Lift;Sit;Stand;Locomotion Level;Toileting;Dressing;Squat;Transfers;Stairs;Hygiene/Grooming;Other    Examination-Participation Restrictions  Community Activity;Volunteer;Cleaning;Interpersonal Relationship;Yard Work;Other    Rehab Potential  Fair    Clinical Impairments Affecting Rehab Potential  (+) motivated (-) muliple co morbidities, chronic condition    PT Frequency  2x / week    PT Duration  12 weeks    PT Treatment/Interventions  Moist Heat;Patient/family education;Neuromuscular re-education;Therapeutic exercise;Manual techniques;ADLs/Self Care Home Management;Electrical Stimulation;Gait training;Stair training;Functional mobility training;Therapeutic activities;Balance training;Passive range of motion;Dry needling;Energy conservation;Spinal Manipulations;Joint Manipulations;Cryotherapy    PT Next Visit Plan  strengthening, pain control, and promotion of self-management techniques    PT Home Exercise Plan  Medbridge Access Code: [email or web address]    Consulted and Agree with Plan of Care  Patient       Patient will benefit from skilled therapeutic intervention in order to improve the following deficits and impairments:  Decreased strength, Impaired flexibility, Decreased activity tolerance, Impaired perceived functional ability, Pain, Decreased endurance, Difficulty walking, Abnormal gait, Decreased knowledge of use of DME, Decreased skin integrity, Decreased range of motion, Impaired sensation, Improper body mechanics, Obesity, Postural dysfunction, Increased edema, Decreased mobility, Decreased balance, Cardiopulmonary status limiting  activity, Decreased coordination  Visit Diagnosis: Muscle weakness (generalized)  Chronic bilateral low back pain, unspecified whether sciatica present  Difficulty in walking, not elsewhere classified  History of falling     Problem List Patient Active Problem List   Diagnosis Date Noted  . Tobacco use disorder 06/15/2019  . Osteopenia 03/03/2019  . Lower extremity edema 07/31/2018  . Pressure injury of skin 07/24/2018  . Surgical site infection 07/23/2018  . Wound of left leg 07/23/2018  . Atherosclerosis of artery of extremity with rest pain ([place]) 07/08/2018  . Shortness of breath 06/02/2018  . Bruit 06/02/2018  . Coronary artery disease of native artery of native heart with stable angina pectoris ([place]) 06/02/2018  . Atherosclerosis of native arteries of extremity with intermittent claudication ([place]) 05/19/2018  . Peripheral arterial disease ([place]) 05/06/2018  . Personal history of colonic polyps   . Aortic stenosis 12/24/2017  . LVH (left ventricular hypertrophy) 12/24/2017  . Hypertension with heart disease 12/24/2017  . Left atrial dilatation 12/12/2017  . Cardiac murmur 12/12/2017  . Gastrojejunal ulcer 12/04/2017  . Hyperphosphatemia 12/04/2017  . Spinal stenosis of lumbar  region 06/18/2017  . Degeneration of lumbar intervertebral disc 06/02/2017  . Assistance needed with transportation 05/26/2017  . Financial difficulties 05/26/2017  . Needs assistance with community resources 05/26/2017  . Moderate recurrent major depression ([place]) 05/26/2017  . Non-healing wound of lower extremity 02/06/2017  . Status post ankle arthrodesis 12/11/2016  . Controlled substance agreement broken 10/11/2016  . Low back pain 09/25/2016  . Osteoarthritis of right subtalar joint 07/11/2016  . Posterior tibial tendinitis of right lower extremity 07/11/2016  . Breast cancer screening 06/18/2016  . Knee pain 04/03/2016  . Hyponatremia 03/01/2016  . High triglycerides 12/24/2015  .  Pes planus of both feet 11/16/2015  . Plantar fasciitis of right foot 11/16/2015  . Heel spur 11/16/2015  . Diabetic neuropathy ([place]) 11/16/2015  . Right ankle pain 11/16/2015  . Medication monitoring encounter 11/16/2015  . Chronic pain of multiple joints 08/15/2015  . Abnormal mammogram of right breast 06/19/2015  . Cerumen impaction 03/16/2015  . Hyperlipidemia 12/14/2014  . History of transfusion of packed RBC 12/14/2014  . Hx of smoking 12/14/2014  . Status post bariatric surgery 12/14/2014  . Morbid obesity ([place]) 12/14/2014  . Chronic radicular lumbar pain 12/14/2014  . History of GI bleed 11/12/2014  . History of small bowel obstruction 09/07/2014    Everlean Alstrom. [name], PT, DPT 08/09/19, 7:14 PM  [facility] [address] [place], [address] Phone: [phone]   Fax:  [phone]  Name: Andrea Holden MRN: [id number] Date of Birth: 1946/03/17

## 2019-08-11 ENCOUNTER — Ambulatory Visit: Payer: Medicare Other | Admitting: Physical Therapy

## 2019-08-12 ENCOUNTER — Encounter: Payer: Medicare Other | Admitting: Physical Therapy

## 2019-08-16 ENCOUNTER — Ambulatory Visit: Payer: Medicare Other | Admitting: Physical Therapy

## 2019-08-17 ENCOUNTER — Encounter: Payer: Self-pay | Admitting: Physical Therapy

## 2019-08-17 DIAGNOSIS — M6281 Muscle weakness (generalized): Secondary | ICD-10-CM

## 2019-08-17 DIAGNOSIS — G8929 Other chronic pain: Secondary | ICD-10-CM

## 2019-08-17 DIAGNOSIS — Z9181 History of falling: Secondary | ICD-10-CM

## 2019-08-17 DIAGNOSIS — M545 Low back pain, unspecified: Secondary | ICD-10-CM

## 2019-08-17 DIAGNOSIS — R262 Difficulty in walking, not elsewhere classified: Secondary | ICD-10-CM

## 2019-08-17 NOTE — Therapy (Signed)
[facility] [address] [place], [address] Phone: [phone]   Fax:  [phone]  Physical Therapy No-Visit Discharge Summary Reporting Period: 11/17/2018 - 08/17/2019  Patient Details  Name: MERON BOCCHINO MRN: [id number] Date of Birth: [birth year] Referring Provider (PT): Fredderick Severance, NP    Encounter Date: 08/17/2019    Past Medical History:  Diagnosis Date  . Allergy    seasonal allergies  . Anemia 2014   needed 5 units of blood d/t passing out, weak  . Anxiety   . Aortic stenosis 12/24/2017   Echo Aug 2018  . Arthritis   . Asthma    allergy induced asthma  . Cardiac murmur 12/12/2017  . Cataract   . Cataract    left  . Complication of anesthesia    arrhythmia following colonoscopy  . Degenerative disc disease, lumbar   . Degenerative disc disease, lumbar   . Depression   . Diabetes mellitus   . Diabetic neuropathy ([place]) 11/16/2015  . Dysrhythmia    stenosis of left ventricle  . GERD (gastroesophageal reflux disease)   . H/O transfusion    patient was given 5 units of blood while at [facility], blood type O+  . History of chicken pox   . History of hiatal hernia   . History of measles, mumps, or rubella   . HOH (hard of hearing)    does not use hearing aides yet  . Hyperlipidemia   . Hypertension   . Irregular heartbeat   . LVH (left ventricular hypertrophy) 12/24/2017   Echo Aug 2018  . Neuropathy   . Opiate use 11/16/2015  . Peripheral arterial disease ([place]) 05/06/2018   At rest, left > right; refer to vasc  . Pes planus of both feet 11/16/2015  . Plantar fasciitis of right foot 11/16/2015  . Wheezing     Past Surgical History:  Procedure Laterality Date  . ABDOMINAL HYSTERECTOMY    . APPLICATION OF WOUND VAC Left 07/23/2018   Procedure: APPLICATION OF WOUND VAC;  Surgeon: Algernon Huxley, MD;  Location: [facility] ORS;  Service: Vascular;  Laterality: Left;  . banck injections     . CATARACT EXTRACTION W/PHACO Right 05/30/2015   Procedure: CATARACT EXTRACTION PHACO AND INTRAOCULAR LENS PLACEMENT (IOC);  Surgeon: Birder Robson, MD;  Location: [facility] ORS;  Service: Ophthalmology;  Laterality: Right;  [place] 00:57 AP% 20.9 CDE 11.99 fluid pack lot #[id number]  . CHOLECYSTECTOMY  1970  . COLON SURGERY  2013   blocked colon  . COLONOSCOPY WITH PROPOFOL N/A 01/20/2018   Procedure: COLONOSCOPY WITH PROPOFOL;  Surgeon: Lucilla Lame, MD;  Location[facility] ENDOSCOPY;  Service: Endoscopy;  Laterality: N/A;  . ENDARTERECTOMY FEMORAL Left 07/08/2018   Procedure: ENDARTERECTOMY FEMORAL;  Surgeon: Algernon Huxley, MD;  Location: [facility] ORS;  Service: Vascular;  Laterality: Left;  . EYE SURGERY Right    cataract surgery  . FOOT FUSION Right 2018   metal in foot  . GALLBLADDER SURGERY  1970  . GASTRIC BYPASS  2010   lost 178 lbs and regained 40 lbs last few years  . HUMERUS FRACTURE SURGERY Right    metal plate with screws  . internal bleeding  2016   ulcer in past  . LOWER EXTREMITY ANGIOGRAM Left 07/08/2018   Procedure: LOWER EXTREMITY ANGIOGRAM;  Surgeon: Algernon Huxley, MD;  Location: [facility] ORS;  Service: Vascular;  Laterality: Left;  . LOWER EXTREMITY ANGIOGRAPHY Left 05/27/2018   Procedure:  LOWER EXTREMITY ANGIOGRAPHY;  Surgeon: Algernon Huxley, MD;  Location: [place] [facility];  Service: Cardiovascular;  Laterality: Left;  . LOWER EXTREMITY ANGIOGRAPHY Right 12/07/2018   Procedure: LOWER EXTREMITY ANGIOGRAPHY;  Surgeon: Algernon Huxley, MD;  Location: [facility];  Service: Cardiovascular;  Laterality: Right;  . LOWER EXTREMITY ANGIOGRAPHY Left 05/10/2019   Procedure: LOWER EXTREMITY ANGIOGRAPHY;  Surgeon: Algernon Huxley, MD;  Location: [place] [facility];  Service: Cardiovascular;  Laterality: Left;  . MOUTH SURGERY     root canals and crowns and extractions  . OVARY SURGERY    . SKIN GRAFT Right 2018   RT foot. foot has been rebuilt.  it is full of metal  . TENOTOMY ACHILLES  TENDON Right    Percuntaneous. metal in foot  . TONSILLECTOMY    . WOUND DEBRIDEMENT Left 07/23/2018   Procedure: DEBRIDEMENT WOUND;  Surgeon: Algernon Huxley, MD;  Location: [facility] ORS;  Service: Vascular;  Laterality: Left;    There were no vitals filed for this visit.  Subjective Assessment - 08/17/19 1012    Subjective  Patient called and stated [facility] and wanted to come in for one final PT session on 08/16/19 to be discharged. She missed that appointment and so was called today. 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 She feels like she has improved to the point where she is ready to continue working towards her functional and fitness goals at the [facility] and is ready to discharge from physical therapy.    Pertinent History  Patient is a 74 y.o. female who presents to outpatient physical therapy with a referral for medical diagnosis of degeneration of lumbar intervertebral disc. This patient's chief complaints consist of chronic low back pain and foot pain, weakness, stiffness, leading to the following functional deficits: difficulty with with basic ADLs, IADLs, ambulation. She used to get injections to help control back pain but it has recently worsened due to being unable to get injections during [place] pandemic. The patient has been informed of current processes in place at [facility] to protect patients from Covid-19 exposure including social distancing, schedule modifications, and new cleaning procedures. After discussing their particular risk with a therapist based on the patient's personal risk factors, the patient has decided to proceed with in-person therapy. Pt is scheduled for R LE angiography on 12/07/2018. She has recently started smoking again with the stress of COVID19 pandemic and feels  strongly she needs to stop. Surgeon has stated in the past he will not complete surgery if she is smoking.  Relevant past medical history and comorbidities include underwent extensive vascular surgeries in Jan 2020 to improve blood flow, similar surgery for R leg scheduled 12/07/2018. Has a history of left carotid artery stenosis, but not enough to have surgery, has a history of multiple arterial surgeries, history of R cataract surgery, colon surgery, cholecystectomy, R foot skin graft, ovary surgery, gastric bypass, R foot fusion, abdominal hysterectomy; current smoker, plantar fasciitis, peripheral arterial disease, LVH, diabetes mellitus with neuropathy, irregular heartbeat, hiatal hernia, GERD, stenosis of left heart ventricle, depression, lumbar degenerative disc disease, cardiac murmur, asthma, anxiety, obesity, HTN, and seasonal allergies She has been advised not to lift over 10 pounds by physician    Limitations  Sitting;Standing;Walking;House hold activities;Lifting  How long can you sit comfortably?  > 1 hour in almost any chair    How long can you stand comfortably?  10-15 min while cooking    How long can you walk comfortably?  2 times to the mailbox and back (120 feet to mailbox) with single point cane. Can ambulate 1 hour with rollator with seated resting breaks.    Diagnostic tests  MRI lumbar spine report 06/14/2017: "IMPRESSION: 1. Stable degenerative of lumbar spondylosis and scoliosis with multilevel disc disease and facet disease. 2. Stable right foraminal stenosis at L5-S1 due to right-sided disc    Patient Stated Goals  patient would like to get back to PLOF, walking further, be better able to participate in dog showing and trips    Pain Onset  More than a month ago       OBJECTIVE Patient is not present for examination at this time. Please see previous documentation for latest objective data.      PT Short Term Goals - 08/17/19 1016      PT SHORT TERM GOAL #1   Title  Be  independent with initial home exercise program for self-management of symptoms.    Baseline  Initial HEP provided at [facility] (11/17/2018); was completing until vascular procedure, re-instated 12/15/2018 (12/15/2018); currently participating (01/26/2019); Partially doing HEP due the patient's recent health condition/weakness(03/23/2019); Patient has been doing her HEP every other day. 04/20/2019; has returned to completing her walking HEP as prescribed (05/31/2019);    Time  2    Period  Weeks    Status  Achieved    Target Date  05/04/19        PT Long Term Goals - 08/17/19 1016      PT LONG TERM GOAL #1   Title  Be independent with a long-term home exercise program for self-management of symptoms.     Baseline  Initial HEP provided at [facility] (11/17/2018); was doing better before vascular surgery, is working at returning to [facility] since procedure (12/15/2018); currently participating but not assigned final long term HEP yet (01/26/2019); currently participating but not assigned final long term HEP yet (03/23/2019); HEP progressing (04/20/2019; 05/31/2019; 07/08/2019); patient has received long term HEP and has started working out at the [facility] successfully (08/17/2019);    Time  12    Period  Weeks    Status  Achieved    Target Date  08/23/19      PT LONG TERM GOAL #2   Title  Patient will improve 6MWT to equal or greater than 1000 feet with LRAD to demonstrate improved activity tolerance for community ambulation.     Baseline  6 Minute Walk Test: 200 feet with SPC and supervision. Stopped at 1:43 due to fatigue and leg pain. (11/17/2018); 457 feet SPC, on seated and one standing break (12/15/2018); 547 with SPC, 2 seated and one standing rest break (01/26/2019); 468 feet with SPC, 2 seated breaks(03/23/2019); 638 feet with SPC, 2 seated breaks; 747 feet with SPC and 2 standing rest breaks (05/31/2019); 628 feet with SPC and 3 standing rest breaks (07/08/2019);    Time  12    Period  Weeks    Status  Partially  Met    Target Date  08/23/19      PT LONG TERM GOAL #3   Title  Patient will demonstrate improved 5 Times Sit to Stand test to 11 seconds from chair height with no UE support to demonstrate improvement in LE power and functional strength for  daily activities.    Baseline  5 Time Sit to Stand: 27 seconds from chair height no UE support (11/17/2018); 18 seconds, no UE support from chair-high plinth (12/15/2018); 16 seconds with no UE support from chair (01/26/2019); 17.45s with lack of knee extension in standing(03/23/2019); 14.03s without BUEs help (04/20/2019); 16 seconds without BUE help from 18.5 inch seat (05/31/2019); 16 seconds, no UE support from 18.5 inch plinth (07/08/2019);    Time  12    Period  Weeks    Status  Partially Met    Target Date  08/23/19      PT LONG TERM GOAL #4   Title  Patient will ambulate faster than 1.40ms on the 10 Meter Walk Test to improve ability to cross street safey for community participation(05/21/2019); 216m with SPC (05/31/2019);    Baseline  testing deferred to next session (11/17/2018); 1.37 m/sec with SPC (11/24/2018); 1.3 m/sec with SPC (12/15/2018; 01/26/2019); 0.4771m(03/23/2019); 0.98 m/sec with SPC (04/20/2019); 1 m/sec (05/31/2019); 0.75 m/sec (07/08/2019);    Time  12    Period  Weeks    Status  Partially Met    Target Date  08/23/19      PT LONG TERM GOAL #5   Title  Patient will score equal or greater than  25/30 on the Functional Gait Assessment to demonstrate low fall risk to improve her ability to participate safely in community activities.     Baseline  testing deferred to next session (11/17/2018); 11/30 high fall risk (11/24/2018); unable to measure due to time limitation (12/15/2018); 13/30 high fall risk (12/17/2018); 14/30 high fall risk (01/26/2019); 14/30 high fall risk(03/23/2019); 17/30 high fall risk (04/20/2019); 21/30 moderate fall risk (05/31/2019); 20/30 moderate fall risk (07/08/2019);    Time  12    Period  Weeks    Status  Partially Met     Target Date  08/23/19      PT LONG TERM GOAL #6   Title  Reduce pain to equal or less than 4/10 with functional activities to allow patient to complete valued functional tasks such as caring for dogs, grocery shopping, walking with less difficulty.(03/23/2019);    Baseline  8/10 (01/26/2019); 6/10 (03/23/2019); 4/10 (04/20/2019); 4/10 (05/31/2019); 5/10 (07/08/2019);    Time  12    Period  Weeks    Status  Partially Met    Target Date  08/23/19       Patient will benefit from skilled therapeutic intervention in order to improve the following deficits and impairments:  Decreased strength, Impaired flexibility, Decreased activity tolerance, Impaired perceived functional ability, Pain, Decreased endurance, Difficulty walking, Abnormal gait, Decreased knowledge of use of DME, Decreased skin integrity, Decreased range of motion, Impaired sensation, Improper body mechanics, Obesity, Postural dysfunction, Increased edema, Decreased mobility, Decreased balance, Cardiopulmonary status limiting activity, Decreased coordination  Visit Diagnosis: Muscle weakness (generalized)  Chronic bilateral low back pain, unspecified whether sciatica present  Difficulty in walking, not elsewhere classified  History of falling     Problem List Patient Active Problem List   Diagnosis Date Noted  . Tobacco use disorder 06/15/2019  . Osteopenia 03/03/2019  . Lower extremity edema 07/31/2018  . Pressure injury of skin 07/24/2018  . Surgical site infection 07/23/2018  . Wound of left leg 07/23/2018  . Atherosclerosis of artery of extremity with rest pain ([place]/01/2019  . Shortness of breath 06/02/2018  . Bruit 06/02/2018  . Coronary artery disease of native artery of native heart with stable angina pectoris ([place]/08/2017  .  Atherosclerosis of native arteries of extremity with intermittent claudication ([place]) 05/19/2018  . Peripheral arterial disease ([place]) 05/06/2018  . Personal history of colonic  polyps   . Aortic stenosis 12/24/2017  . LVH (left ventricular hypertrophy) 12/24/2017  . Hypertension with heart disease 12/24/2017  . Left atrial dilatation 12/12/2017  . Cardiac murmur 12/12/2017  . Gastrojejunal ulcer 12/04/2017  . Hyperphosphatemia 12/04/2017  . Spinal stenosis of lumbar region 06/18/2017  . Degeneration of lumbar intervertebral disc 06/02/2017  . Assistance needed with transportation 05/26/2017  . Financial difficulties 05/26/2017  . Needs assistance with community resources 05/26/2017  . Moderate recurrent major depression ([place]) 05/26/2017  . Non-healing wound of lower extremity 02/06/2017  . Status post ankle arthrodesis 12/11/2016  . Controlled substance agreement broken 10/11/2016  . Low back pain 09/25/2016  . Osteoarthritis of right subtalar joint 07/11/2016  . Posterior tibial tendinitis of right lower extremity 07/11/2016  . Breast cancer screening 06/18/2016  . Knee pain 04/03/2016  . Hyponatremia 03/01/2016  . High triglycerides 12/24/2015  . Pes planus of both feet 11/16/2015  . Plantar fasciitis of right foot 11/16/2015  . Heel spur 11/16/2015  . Diabetic neuropathy ([place]) 11/16/2015  . Right ankle pain 11/16/2015  . Medication monitoring encounter 11/16/2015  . Chronic pain of multiple joints 08/15/2015  . Abnormal mammogram of right breast 06/19/2015  . Cerumen impaction 03/16/2015  . Hyperlipidemia 12/14/2014  . History of transfusion of packed RBC 12/14/2014  . Hx of smoking 12/14/2014  . Status post bariatric surgery 12/14/2014  . Morbid obesity ([place]) 12/14/2014  . Chronic radicular lumbar pain 12/14/2014  . History of GI bleed 11/12/2014  . History of small bowel obstruction 09/07/2014    Everlean Alstrom. [name], PT, DPT 08/17/19, 10:22 AM  [facility] [address], [place], [address] Phone: [phone]   Fax:  [phone]  Name: ANYLAH SCHEIB MRN:  [id number] Date of Birth: 05-11-1946

## 2019-08-18 ENCOUNTER — Ambulatory Visit: Payer: Medicare Other | Admitting: Physical Therapy

## 2019-08-23 ENCOUNTER — Encounter: Payer: Medicare Other | Admitting: Physical Therapy

## 2019-08-24 ENCOUNTER — Ambulatory Visit (INDEPENDENT_AMBULATORY_CARE_PROVIDER_SITE_OTHER): Payer: Medicare Other | Admitting: Family Medicine

## 2019-08-24 ENCOUNTER — Encounter: Payer: Self-pay | Admitting: Family Medicine

## 2019-08-24 ENCOUNTER — Other Ambulatory Visit: Payer: Self-pay

## 2019-08-24 VITALS — BP 134/86 | HR 79 | Temp 98.9°F | Resp 14 | Ht 61.0 in | Wt 195.3 lb

## 2019-08-24 DIAGNOSIS — M545 Low back pain, unspecified: Secondary | ICD-10-CM

## 2019-08-24 DIAGNOSIS — I739 Peripheral vascular disease, unspecified: Secondary | ICD-10-CM

## 2019-08-24 DIAGNOSIS — J449 Chronic obstructive pulmonary disease, unspecified: Secondary | ICD-10-CM | POA: Diagnosis not present

## 2019-08-24 DIAGNOSIS — M51369 Other intervertebral disc degeneration, lumbar region without mention of lumbar back pain or lower extremity pain: Secondary | ICD-10-CM

## 2019-08-24 DIAGNOSIS — E782 Mixed hyperlipidemia: Secondary | ICD-10-CM | POA: Diagnosis not present

## 2019-08-24 DIAGNOSIS — E669 Obesity, unspecified: Secondary | ICD-10-CM

## 2019-08-24 DIAGNOSIS — J45909 Unspecified asthma, uncomplicated: Secondary | ICD-10-CM

## 2019-08-24 DIAGNOSIS — I119 Hypertensive heart disease without heart failure: Secondary | ICD-10-CM | POA: Diagnosis not present

## 2019-08-24 DIAGNOSIS — J309 Allergic rhinitis, unspecified: Secondary | ICD-10-CM

## 2019-08-24 DIAGNOSIS — G8929 Other chronic pain: Secondary | ICD-10-CM

## 2019-08-24 DIAGNOSIS — J3089 Other allergic rhinitis: Secondary | ICD-10-CM

## 2019-08-24 DIAGNOSIS — E1142 Type 2 diabetes mellitus with diabetic polyneuropathy: Secondary | ICD-10-CM

## 2019-08-24 DIAGNOSIS — M5136 Other intervertebral disc degeneration, lumbar region: Secondary | ICD-10-CM

## 2019-08-24 DIAGNOSIS — F3341 Major depressive disorder, recurrent, in partial remission: Secondary | ICD-10-CM | POA: Diagnosis not present

## 2019-08-24 DIAGNOSIS — K219 Gastro-esophageal reflux disease without esophagitis: Secondary | ICD-10-CM

## 2019-08-24 DIAGNOSIS — I70213 Atherosclerosis of native arteries of extremities with intermittent claudication, bilateral legs: Secondary | ICD-10-CM

## 2019-08-24 DIAGNOSIS — F17200 Nicotine dependence, unspecified, uncomplicated: Secondary | ICD-10-CM

## 2019-08-24 DIAGNOSIS — Z5181 Encounter for therapeutic drug level monitoring: Secondary | ICD-10-CM

## 2019-08-24 DIAGNOSIS — E1169 Type 2 diabetes mellitus with other specified complication: Secondary | ICD-10-CM

## 2019-08-24 MED ORDER — MONTELUKAST SODIUM 10 MG PO TABS: 10.0000 mg | ORAL_TABLET | Freq: Every day | ORAL | 3 refills | Status: AC

## 2019-08-24 MED ORDER — ATORVASTATIN CALCIUM 10 MG PO TABS: 10.0000 mg | ORAL_TABLET | Freq: Every day | ORAL | 3 refills | Status: DC

## 2019-08-24 MED ORDER — LISINOPRIL 10 MG PO TABS: 10.0000 mg | ORAL_TABLET | Freq: Every day | ORAL | 3 refills | Status: DC

## 2019-08-24 MED ORDER — UMECLIDINIUM-VILANTEROL 62.5-25 MCG/INH IN AEPB: 1.0000 | INHALATION_SPRAY | Freq: Every day | RESPIRATORY_TRACT | 11 refills | Status: DC

## 2019-08-24 MED ORDER — SERTRALINE HCL 100 MG PO TABS: ORAL_TABLET | ORAL | 3 refills | Status: AC

## 2019-08-24 MED ORDER — GABAPENTIN 400 MG PO CAPS: 400.0000 mg | ORAL_CAPSULE | Freq: Two times a day (BID) | ORAL | 1 refills | Status: DC

## 2019-08-24 MED ORDER — PANTOPRAZOLE SODIUM 20 MG PO TBEC: 20.0000 mg | DELAYED_RELEASE_TABLET | Freq: Every day | ORAL | 1 refills | Status: DC

## 2019-08-24 MED ORDER — FLUTICASONE PROPIONATE 50 MCG/ACT NA SUSP: 2.0000 | Freq: Every day | NASAL | 4 refills | Status: AC | PRN

## 2019-08-24 NOTE — Progress Notes (Signed)
Name: Andrea Holden   MRN: [id number]    DOB: [birth year]   Date:08/24/2019       Progress Note  Chief Complaint  Patient presents with  . Follow-up  . Hyperlipidemia  . Gastroesophageal Reflux     Subjective:   Kameah J Linden is a 74 y.o. female, presents to clinic for routine follow up on the conditions listed above.  Pt has been going to PT over the past 3 months since last routine OV and completed and was discharged from PT about 1-2 weeks ago- working on her LE strength and low back pain, she also follow up with Dr. Eduard Clos for PVD/PAD with improving ABIs.  Hypertension:   Stable, controlled, dose 10 mg no concerns SE  Currently managed on lisinopril has been taking 10 mg  Pt reports good med compliance and denies any SE.   No lightheadedness, hypotension, syncope. Blood pressure today is a little soft - but well controlled. BP Readings from Last 3 Encounters:  08/24/19 134/86  07/21/19 110/70  06/15/19 (!) 184/65  Pt denies CP, SOB, exertional sx, LE edema, palpitation, Ha's, visual disturbances Dietary efforts for BP?  Healthy balanced   Can monitor at home - usually runs SBP 130's over 70's, taking meds, no concerns or SE  Hyperlipidemia: Current Medication Regimen:  lipitor 10 mg - complaint with meds, no SE/myalgias or concerns, stable, last labs were good and LDL well controlled Lab Results  Component Value Date   CHOL 129 06/03/2019   HDL 43 (L) 06/03/2019   LDLCALC 67 06/03/2019   TRIG 109 06/03/2019   CHOLHDL 3.0 06/03/2019  The ASCVD Risk score Mikey Bussing DC Jr., et al., 2013) failed to calculate for the following reasons:   The valid total cholesterol range is 130 to 320 mg/dL - Current Diet: healthy - Denies: Chest pain, myalgias (SOB with COPD unchanged) - Documented aortic atherosclerosis? Yes  - Risk factors for atherosclerosis: arteriosclerotic heart disease, cerebral vascular disease, diabetes mellitus, hypercholesterolemia,  hypertension, peripheral vascular disease and smoking Leg cramping at night gone, leg pain and weakness is still not where she wants it f  PAD- stable, continues to do well, bruise on hand, no other bleeding concers.  on plavix doing well, she does hate how easily she bruises, no new bruising or bleeding, no concern, denies melena, hematochezia   COPD/asthma:  Not seeing pulmonology, stable Compliant with anoro compliant with dosing, got nebulizer machine and duonebs doing PRN for congestion  Still working on controlling allergy triggers for asthma - flonase, singulair, and antihistamine  Current Smoker- still was trying chantix and still has cigarettes cut back but not quit all the way, discussed smoking cessation again, her lung dx, PAD and benefits of d/c smoking.    GERD:  stable, continues to be Well controlled, requests med refill takes protonix at night   Patient Active Problem List   Diagnosis Date Noted  . Tobacco use disorder 06/15/2019  . Osteopenia 03/03/2019  . Lower extremity edema 07/31/2018  . Pressure injury of skin 07/24/2018  . Surgical site infection 07/23/2018  . Wound of left leg 07/23/2018  . Atherosclerosis of artery of extremity with rest pain ([place]) 07/08/2018  . Shortness of breath 06/02/2018  . Bruit 06/02/2018  . Coronary artery disease of native artery of native heart with stable angina pectoris ([place]) 06/02/2018  . Atherosclerosis of native arteries of extremity with intermittent claudication ([place]) 05/19/2018  . Peripheral arterial disease ([place]) 05/06/2018  . Personal history of  colonic polyps   . Aortic stenosis 12/24/2017  . LVH (left ventricular hypertrophy) 12/24/2017  . Hypertension with heart disease 12/24/2017  . Left atrial dilatation 12/12/2017  . Cardiac murmur 12/12/2017  . Gastrojejunal ulcer 12/04/2017  . Hyperphosphatemia 12/04/2017  . Spinal stenosis of lumbar region 06/18/2017  . Degeneration of lumbar intervertebral disc  06/02/2017  . Assistance needed with transportation 05/26/2017  . Financial difficulties 05/26/2017  . Needs assistance with community resources 05/26/2017  . Moderate recurrent major depression ([place]) 05/26/2017  . Non-healing wound of lower extremity 02/06/2017  . Status post ankle arthrodesis 12/11/2016  . Controlled substance agreement broken 10/11/2016  . Low back pain 09/25/2016  . Osteoarthritis of right subtalar joint 07/11/2016  . Posterior tibial tendinitis of right lower extremity 07/11/2016  . Breast cancer screening 06/18/2016  . Knee pain 04/03/2016  . Hyponatremia 03/01/2016  . High triglycerides 12/24/2015  . Pes planus of both feet 11/16/2015  . Plantar fasciitis of right foot 11/16/2015  . Heel spur 11/16/2015  . Diabetic neuropathy ([place]) 11/16/2015  . Right ankle pain 11/16/2015  . Medication monitoring encounter 11/16/2015  . Chronic pain of multiple joints 08/15/2015  . Abnormal mammogram of right breast 06/19/2015  . Cerumen impaction 03/16/2015  . Hyperlipidemia 12/14/2014  . History of transfusion of packed RBC 12/14/2014  . Hx of smoking 12/14/2014  . Status post bariatric surgery 12/14/2014  . Morbid obesity ([place]) 12/14/2014  . Chronic radicular lumbar pain 12/14/2014  . History of GI bleed 11/12/2014  . History of small bowel obstruction 09/07/2014    Past Surgical History:  Procedure Laterality Date  . ABDOMINAL HYSTERECTOMY    . APPLICATION OF WOUND VAC Left 07/23/2018   Procedure: APPLICATION OF WOUND VAC;  Surgeon: Algernon Huxley, MD;  Location: [facility] ORS;  Service: Vascular;  Laterality: Left;  . banck injections    . CATARACT EXTRACTION W/PHACO Right 05/30/2015   Procedure: CATARACT EXTRACTION PHACO AND INTRAOCULAR LENS PLACEMENT (IOC);  Surgeon: Birder Robson, MD;  Location: [facility] ORS;  Service: Ophthalmology;  Laterality: Right;  [place] 00:57 AP% 20.9 CDE 11.99 fluid pack lot #[id number]  . CHOLECYSTECTOMY  1970  . COLON SURGERY  2013   blocked  colon  . COLONOSCOPY WITH PROPOFOL N/A 01/20/2018   Procedure: COLONOSCOPY WITH PROPOFOL;  Surgeon: Lucilla Lame, MD;  Location: [facility] ENDOSCOPY;  Service: Endoscopy;  Laterality: N/A;  . ENDARTERECTOMY FEMORAL Left 07/08/2018   Procedure: ENDARTERECTOMY FEMORAL;  Surgeon: Algernon Huxley, MD;  Location: [facility] ORS;  Service: Vascular;  Laterality: Left;  . EYE SURGERY Right    cataract surgery  . FOOT FUSION Right 2018   metal in foot  . GALLBLADDER SURGERY  1970  . GASTRIC BYPASS  2010   lost 178 lbs and regained 40 lbs last few years  . HUMERUS FRACTURE SURGERY Right    metal plate with screws  . internal bleeding  2016   ulcer in past  . LOWER EXTREMITY ANGIOGRAM Left 07/08/2018   Procedure: LOWER EXTREMITY ANGIOGRAM;  Surgeon: Algernon Huxley, MD;  Location: [facility] ORS;  Service: Vascular;  Laterality: Left;  . LOWER EXTREMITY ANGIOGRAPHY Left 05/27/2018   Procedure: LOWER EXTREMITY ANGIOGRAPHY;  Surgeon: Algernon Huxley, MD;  Location: [place] [facility];  Service: Cardiovascular;  Laterality: Left;  . LOWER EXTREMITY ANGIOGRAPHY Right 12/07/2018   Procedure: LOWER EXTREMITY ANGIOGRAPHY;  Surgeon: Algernon Huxley, MD;  Location: [place] [facility];  Service: Cardiovascular;  Laterality: Right;  . LOWER EXTREMITY ANGIOGRAPHY  Left 05/10/2019   Procedure: LOWER EXTREMITY ANGIOGRAPHY;  Surgeon: Algernon Huxley, MD;  Location: [place] [facility];  Service: Cardiovascular;  Laterality: Left;  . MOUTH SURGERY     root canals and crowns and extractions  . OVARY SURGERY    . SKIN GRAFT Right 2018   RT foot. foot has been rebuilt.  it is full of metal  . TENOTOMY ACHILLES TENDON Right    Percuntaneous. metal in foot  . TONSILLECTOMY    . WOUND DEBRIDEMENT Left 07/23/2018   Procedure: DEBRIDEMENT WOUND;  Surgeon: Algernon Huxley, MD;  Location: [facility] ORS;  Service: Vascular;  Laterality: Left;    Family History  Problem Relation Age of Onset  . Asthma Mother   . COPD Mother   . Arthritis Father   .  Depression Father   . Heart disease Father   . Hypertension Father   . Stroke Father   . Heart attack Father   . Arthritis Brother   . Depression Brother   . Diabetes Brother   . Heart disease Brother   . Hyperlipidemia Brother   . Hypertension Brother   . Stroke Brother   . Vision loss Brother   . Heart attack Brother   . Diabetes Maternal Grandmother   . Thyroid disease Daughter   . Colitis Daughter   . Breast cancer Neg Hx     Social History   Tobacco Use  . Smoking status: Current Some Day Smoker    Packs/day: 0.50    Years: 55.00    Pack years: 27.50    Types: Cigarettes    Start date: 07/01/1960    Last attempt to quit: 11/22/2018    Years since quitting: 0.7  . Smokeless tobacco: Never Used  . Tobacco comment: trying to quit again, using chantix  Substance Use Topics  . Alcohol use: Yes    Alcohol/week: 0.0 standard drinks    Comment: 2 drinks per month  . Drug use: Not Currently    Types: Marijuana    Comment: used for pain      Current Outpatient Medications:  .  acetaminophen (TYLENOL) 325 MG tablet, Take 325 mg by mouth every 6 (six) hours as needed. , Disp: , Rfl:  .  albuterol (VENTOLIN HFA) 108 (90 Base) MCG/ACT inhaler, Inhale 2 puffs into the lungs every 6 (six) hours as needed for wheezing or shortness of breath., Disp: 18 g, Rfl: 2 .  atorvastatin (LIPITOR) 10 MG tablet, Take 1 tablet (10 mg total) by mouth at bedtime., Disp: 90 tablet, Rfl: 3 .  cholecalciferol (VITAMIN D3) 25 MCG (1000 UNIT) tablet, Take 1,000 Units by mouth daily., Disp: , Rfl:  .  clopidogrel (PLAVIX) 75 MG tablet, Take 1 tablet (75 mg total) by mouth daily., Disp: 90 tablet, Rfl: 3 .  fluticasone (FLONASE) 50 MCG/ACT nasal spray, Place 2 sprays into both nostrils daily as needed., Disp: 16 g, Rfl: 4 .  gabapentin (NEURONTIN) 400 MG capsule, Take 1 capsule (400 mg total) by mouth 2 (two) times daily., Disp: 180 capsule, Rfl: 1 .  HYDROcodone-acetaminophen (NORCO) 5-325 MG tablet,  Take 0.5 tablets by mouth every 6 (six) hours as needed for moderate pain., Disp: 30 tablet, Rfl: 0 .  ipratropium-albuterol (DUONEB) 0.5-2.5 (3) MG/3ML SOLN, Take 3 mLs by nebulization 3 (three) times daily as needed., Disp: 180 mL, Rfl: 1 .  lisinopril (ZESTRIL) 10 MG tablet, TAKE 1 TABLET BY MOUTH ONCE DAILY, Disp: 90 tablet, Rfl: 1 .  Multiple Vitamins-Minerals (MULTIVITAMIN ADULTS 50+) TABS, Take 1 tablet by mouth daily., Disp: 90 tablet, Rfl: 1 .  sertraline (ZOLOFT) 100 MG tablet, TAKE 1 AND 1/2 TABLETS (150 MG TOTAL) BYMOUTH DAILY, Disp: 135 tablet, Rfl: 1 .  umeclidinium-vilanterol (ANORO ELLIPTA) 62.5-25 MCG/INH AEPB, Inhale 1 puff into the lungs daily., Disp: 60 each, Rfl: 2 .  varenicline (CHANTIX) 1 MG tablet, Take 1 tablet (1 mg total) by mouth 2 (two) times daily., Disp: 60 tablet, Rfl: 2 .  vitamin C (ASCORBIC ACID) 500 MG tablet, Take 500 mg by mouth daily. , Disp: , Rfl:  .  Zinc Sulfate (ZINC 15 PO), Take by mouth., Disp: , Rfl:  .  co-enzyme Q-10 30 MG capsule, Take 100 mg by mouth daily. , Disp: , Rfl:  .  montelukast (SINGULAIR) 10 MG tablet, TAKE 1 TABLET BY MOUTH EVERY NIGHT AT BEDTIME (Patient not taking: Reported on 08/24/2019), Disp: 30 tablet, Rfl: 3 .  pantoprazole (PROTONIX) 20 MG tablet, Take 1 tablet (20 mg total) by mouth daily., Disp: 90 tablet, Rfl: 1 .  Respiratory Therapy Supplies (NEBULIZER/TUBING/MOUTHPIECE) KIT, Disp one nebulizer machine, tubing set and mouthpiece kit, Disp: 1 kit, Rfl: 0 .  Turmeric 500 MG CAPS, Take by mouth., Disp: , Rfl:   Allergies  Allergen Reactions  . Meloxicam Other (See Comments)    GI bleeding  . Nsaids Other (See Comments)    Gi bleeding  . Sitagliptin Hives and Itching    Januvia     Chart Review Today: I personally reviewed active problem list, medication list, allergies, family history, social history, health maintenance, notes from last encounter, lab results, imaging with the patient/caregiver today.    Review of  Systems  Constitutional: Negative.   HENT: Negative.   Eyes: Negative.   Respiratory: Negative.   Cardiovascular: Negative.   Gastrointestinal: Negative.   Endocrine: Negative.   Genitourinary: Negative.   Musculoskeletal: Negative.   Skin: Negative.   Allergic/Immunologic: Negative.   Neurological: Negative.   Hematological: Negative.   Psychiatric/Behavioral: Negative.   All other systems reviewed and are negative.    Objective:    Vitals:   08/24/19 0928  BP: 134/86  Pulse: 79  Resp: 14  Temp: 98.9 F (37.2 C)  SpO2: 97%  Weight: 195 lb 4.8 oz (88.6 kg)  Height: '5\' 1"'  (1.549 m)    Body mass index is 36.9 kg/m.  Physical Exam Vitals and nursing note reviewed.  Constitutional:      General: She is not in acute distress.    Appearance: Normal appearance. She is well-developed. She is obese. She is not ill-appearing, toxic-appearing or diaphoretic.     Interventions: Face mask in place.  HENT:     Head: Normocephalic and atraumatic.     Right Ear: External ear normal.     Left Ear: External ear normal.  Eyes:     General: Lids are normal. No scleral icterus.       Right eye: No discharge.        Left eye: No discharge.     Conjunctiva/sclera: Conjunctivae normal.  Neck:     Trachea: Phonation normal. No tracheal deviation.  Cardiovascular:     Rate and Rhythm: Normal rate and regular rhythm.     Pulses: Normal pulses.          Radial pulses are 2+ on the right side and 2+ on the left side.       Posterior tibial pulses are 2+ on the right  side and 2+ on the left side.     Heart sounds: Normal heart sounds. No murmur. No friction rub. No gallop.   Pulmonary:     Effort: Pulmonary effort is normal. No respiratory distress.     Breath sounds: Normal breath sounds. No stridor. No wheezing, rhonchi or rales.  Chest:     Chest wall: No tenderness.  Abdominal:     General: Bowel sounds are normal. There is no distension.     Palpations: Abdomen is soft.      Tenderness: There is no abdominal tenderness. There is no guarding or rebound.  Musculoskeletal:        General: No deformity. Normal range of motion.     Cervical back: Normal range of motion and neck supple.     Right lower leg: No edema.     Left lower leg: No edema.  Lymphadenopathy:     Cervical: No cervical adenopathy.  Skin:    General: Skin is warm and dry.     Capillary Refill: Capillary refill takes less than 2 seconds.     Coloration: Skin is not jaundiced or pale.     Findings: No rash.  Neurological:     Mental Status: She is alert.     Motor: No abnormal muscle tone.  Psychiatric:        Speech: Speech normal.        Behavior: Behavior normal.       PHQ2/9: Depression screen [facility] 2/9 08/24/2019 05/24/2019 03/11/2019 01/05/2019 12/18/2018  Decreased Interest 0 1 3 0 0  Down, Depressed, Hopeless 1 - 3 1 0  PHQ - 2 Score '1 1 6 1 ' 0  Altered sleeping 0 1 0 0 0  Tired, decreased energy 1 1 0 0 0  Change in appetite 1 0 0 0 0  Feeling bad or failure about yourself  1 0 0 1 0  Trouble concentrating 0 0 0 1 0  Moving slowly or fidgety/restless 0 0 0 1 0  Suicidal thoughts 0 0 0 0 0  PHQ-9 Score '4 3 6 4 ' 0  Difficult doing work/chores Somewhat difficult Not difficult at all Not difficult at all Not difficult at all Not difficult at all  Some recent data might be hidden    phq 9 is reviewed - she wants to get back to work, still feeling isolated  Fall Risk: Fall Risk  08/24/2019 05/24/2019 03/11/2019 01/05/2019 12/18/2018  Falls in the past year? 0 0 '1 1 1  ' Comment - - - - -  Number falls in past yr: 0 0 '1 1 1  ' Injury with Fall? 0 0 0 0 0  Comment - - - - -  Risk for fall due to : - - - - -  Risk for fall due to: Comment - - - - -  Follow up - - - Falls prevention discussed -    Functional Status Survey: Is the patient deaf or have difficulty hearing?: Yes Does the patient have difficulty seeing, even when wearing glasses/contacts?: Yes Does the patient have difficulty  concentrating, remembering, or making decisions?: No Does the patient have difficulty walking or climbing stairs?: Yes Does the patient have difficulty dressing or bathing?: No Does the patient have difficulty doing errands alone such as visiting a doctor's office or shopping?: No   Assessment & Plan:   1. Hypertension with heart disease Well controlled with 10 mg dose - labs next visit - lisinopril (ZESTRIL) 10 MG tablet;  Take 1 tablet (10 mg total) by mouth daily.  Dispense: 90 tablet; Refill: 3 - COMPLETE METABOLIC PANEL WITH GFR  2. Mixed hyperlipidemia Compliant with meds, no SE, no myalgias, fatigue or jaundice - atorvastatin (LIPITOR) 10 MG tablet; Take 1 tablet (10 mg total) by mouth at bedtime.  Dispense: 90 tablet; Refill: 3 - COMPLETE METABOLIC PANEL WITH GFR - Lipid panel  3. Recurrent major depressive disorder, in partial remission (HCC) Refill on meds, doing well over all, pandemic and isolation is making it a little hard - sertraline (ZOLOFT) 100 MG tablet; TAKE 1 AND 1/2 TABLETS (150 MG TOTAL) BY MOUTH DAILY  Dispense: 135 tablet; Refill: 3  4. Chronic obstructive pulmonary disease, unspecified COPD type ([place]) Refill on inhalers, doing well with duonebs, anoro and SABA - umeclidinium-vilanterol (ANORO ELLIPTA) 62.5-25 MCG/INH AEPB; Inhale 1 puff into the lungs daily.  Dispense: 60 each; Refill: 11  5. Peripheral arterial disease ([place]) Per vascular surgery last OV reviewed - COMPLETE METABOLIC PANEL WITH GFR - Lipid panel  6. Atherosclerosis of native artery of both lower extremities with intermittent claudication (HCC) See aobve - COMPLETE METABOLIC PANEL WITH GFR - Lipid panel  7. Diabetic polyneuropathy associated with type 2 diabetes mellitus (HCC) Neuropathy well controlled, DM well controlled with labs 3 months ago, pt wants to wait to do labs next visit - Hemoglobin A1c - gabapentin (NEURONTIN) 400 MG capsule; Take 1 capsule (400 mg total) by mouth 2  (two) times daily.  Dispense: 180 capsule; Refill: 1  8. GERD without esophagitis Sx well controlled  - pantoprazole (PROTONIX) 20 MG tablet; Take 1 tablet (20 mg total) by mouth daily.  Dispense: 90 tablet; Refill: 1  9. Medication monitoring encounter Defer labs to next visit.  10. Tobacco use disorder Smoking cessation instruction/counseling given:  counseled patient on the dangers of tobacco use, advised patient to stop smoking, and reviewed strategies to maximize success   11. Chronic low back pain, unspecified back pain laterality, unspecified whether sciatica present Improved with PT, she is still frustrated with mobility and using cane  12. Diabetes mellitus type 2 in obese ([place]) See above - Hemoglobin A1c  13. Degeneration of lumbar intervertebral disc Pain well controlled, just finished with PT  14. Non-seasonal allergic rhinitis, unspecified trigger Well controlled  - montelukast (SINGULAIR) 10 MG tablet; Take 1 tablet (10 mg total) by mouth at bedtime.  Dispense: 90 tablet; Refill: 3  15. Uncomplicated asthma, unspecified asthma severity, unspecified whether persistent Well controlled with current meds - montelukast (SINGULAIR) 10 MG tablet; Take 1 tablet (10 mg total) by mouth at bedtime.  Dispense: 90 tablet; Refill: 3  16. Chronic obstructive pulmonary disease, unspecified COPD type (HCC) Stable, refills sent in - umeclidinium-vilanterol (ANORO ELLIPTA) 62.5-25 MCG/INH AEPB; Inhale 1 puff into the lungs daily.  Dispense: 60 each; Refill: 11  17. Allergic rhinitis, unspecified seasonality, unspecified trigger Stable with OTC meds and singulair - fluticasone (FLONASE) 50 MCG/ACT nasal spray; Place 2 sprays into both nostrils daily as needed.  Dispense: 16 g; Refill: 4  18. Encounter for medication monitoring Will defer labs to next OV per pt request, overall dx and conditions well controlled and her last labs were good from 3 months ago - CBC with  Differential/Platelet - COMPLETE METABOLIC PANEL WITH GFR - Lipid panel - Hemoglobin A1c   Return in about 3 months (around 11/21/2019) for routing f/up.   [name], PA-C 08/24/19 9:45 AM

## 2019-08-25 ENCOUNTER — Encounter: Payer: Medicare Other | Admitting: Physical Therapy

## 2019-08-30 ENCOUNTER — Encounter: Payer: Medicare Other | Admitting: Physical Therapy

## 2019-08-31 ENCOUNTER — Telehealth: Payer: Self-pay | Admitting: Family Medicine

## 2019-08-31 DIAGNOSIS — J449 Chronic obstructive pulmonary disease, unspecified: Secondary | ICD-10-CM

## 2019-08-31 DIAGNOSIS — F17200 Nicotine dependence, unspecified, uncomplicated: Secondary | ICD-10-CM

## 2019-08-31 NOTE — Telephone Encounter (Signed)
Pt called states she can not afford chantix, or anoro and wants to see about assistance with meds?  Can we place referral for julie?

## 2019-08-31 NOTE — Telephone Encounter (Signed)
Copied from CRM [id number]. Topic: General - Other >> Aug 31, 2019  9:14 AM Andrea Holden wrote: Reason for CRM: Patient would like the nurse to call her regarding her medication.  Patient did not give details but would like a call back at [phone]

## 2019-08-31 NOTE — Telephone Encounter (Signed)
Left vm, we have sample here of anoro and referral placed julie should be in contact about medication assistance

## 2019-09-02 ENCOUNTER — Encounter: Payer: Medicare Other | Admitting: Physical Therapy

## 2019-09-03 ENCOUNTER — Telehealth: Payer: Self-pay | Admitting: Family Medicine

## 2019-09-03 NOTE — Chronic Care Management (AMB) (Signed)
  Chronic Care Management   Note  09/03/2019 Name: SEREENA MARANDO MRN: [id number] DOB: [birth year]  Berlin Hun Beets is a 74 y.o. year old female who is a primary care patient of Danelle Berry, [place]. Toia Micale Reither is currently enrolled in care management services. An additional referral for Pharm D was placed.   Follow up plan: Telephone appointment with care management team member scheduled for:09/07/2019  Elisha Ponder, LPN Health Advisor, Embedded Care Coordination [facility] Health Care Management ??[email or web address] ??[phone]

## 2019-09-03 NOTE — Chronic Care Management (AMB) (Signed)
  Chronic Care Management   Note  09/03/2019 Name: Andrea Holden MRN: [id number] DOB: 1946-04-01  Andrea Holden is a 74 y.o. year old female who is a primary care patient of Danelle Berry, [place]. Andrea Holden is currently enrolled in care management services. An additional referral for Pharm D was placed.   Follow up plan: Unsuccessful telephone outreach attempt made. A HIPPA compliant phone message was left for the patient providing contact information and requesting a return call.  The care management team will reach out to the patient again over the next 7 days.  If patient returns call to provider office, please advise to call Embedded Care Management Care Guide Elisha Ponder LPN at [phone]  Elisha Ponder, LPN Health Advisor, Embedded Care Coordination [facility] Health Care Management ??[email or web address] ??[phone]

## 2019-09-03 NOTE — Telephone Encounter (Signed)
Entered in error

## 2019-09-06 ENCOUNTER — Encounter: Payer: Medicare Other | Admitting: Physical Therapy

## 2019-09-07 ENCOUNTER — Ambulatory Visit: Payer: Medicare Other | Admitting: Pharmacist

## 2019-09-09 ENCOUNTER — Encounter: Payer: Medicare Other | Admitting: Physical Therapy

## 2019-09-10 NOTE — Chronic Care Management (AMB) (Signed)
  Chronic Care Management   Note  09/10/2019 Name: KIERYN BURTIS MRN: [id number] DOB: [birth year]  74 y.o. year old female referred to Chronic Care Management by Danelle Berry for medication assistance  Was unable to reach patient via telephone today and have left HIPAA compliant voicemail asking patient to return my call. (unsuccessful outreach #1).  Follow up plan: A HIPPA compliant phone message was left for the patient providing contact information and requesting a return call.  The care management team will reach out to the patient again over the next 10 days.   Karalee Height, PharmD Clinical Pharmacist [facility] [phone]

## 2019-09-13 ENCOUNTER — Encounter: Payer: Medicare Other | Admitting: Physical Therapy

## 2019-09-16 ENCOUNTER — Encounter: Payer: Medicare Other | Admitting: Physical Therapy

## 2019-09-20 ENCOUNTER — Encounter: Payer: Medicare Other | Admitting: Physical Therapy

## 2019-09-21 ENCOUNTER — Ambulatory Visit: Payer: Medicare Other | Admitting: Family Medicine

## 2019-09-23 ENCOUNTER — Encounter: Payer: Medicare Other | Admitting: Physical Therapy

## 2019-09-24 ENCOUNTER — Ambulatory Visit (INDEPENDENT_AMBULATORY_CARE_PROVIDER_SITE_OTHER): Payer: Medicare Other | Admitting: Vascular Surgery

## 2019-09-24 ENCOUNTER — Other Ambulatory Visit: Payer: Self-pay

## 2019-09-24 ENCOUNTER — Ambulatory Visit (INDEPENDENT_AMBULATORY_CARE_PROVIDER_SITE_OTHER): Payer: Medicare Other

## 2019-09-24 ENCOUNTER — Ambulatory Visit (INDEPENDENT_AMBULATORY_CARE_PROVIDER_SITE_OTHER): Payer: Medicare Other | Admitting: Nurse Practitioner

## 2019-09-24 ENCOUNTER — Encounter (INDEPENDENT_AMBULATORY_CARE_PROVIDER_SITE_OTHER): Payer: Self-pay | Admitting: Nurse Practitioner

## 2019-09-24 VITALS — BP 178/66 | HR 69 | Resp 12 | Ht 61.0 in | Wt 194.0 lb

## 2019-09-24 DIAGNOSIS — I70229 Atherosclerosis of native arteries of extremities with rest pain, unspecified extremity: Secondary | ICD-10-CM

## 2019-09-24 DIAGNOSIS — I119 Hypertensive heart disease without heart failure: Secondary | ICD-10-CM

## 2019-09-24 DIAGNOSIS — F17200 Nicotine dependence, unspecified, uncomplicated: Secondary | ICD-10-CM | POA: Diagnosis not present

## 2019-09-27 ENCOUNTER — Encounter: Payer: Medicare Other | Admitting: Physical Therapy

## 2019-09-27 ENCOUNTER — Encounter (INDEPENDENT_AMBULATORY_CARE_PROVIDER_SITE_OTHER): Payer: Self-pay | Admitting: Nurse Practitioner

## 2019-09-27 NOTE — Progress Notes (Signed)
Subjective:    Patient ID: Andrea Holden, female    DOB: 1945/11/11, 74 y.o.   MRN: [id number] Chief Complaint  Patient presents with  . Follow-up    U/S follow up    The patient returns to the office for followup and review of the noninvasive studies. There have been no interval changes in lower extremity symptoms. No interval shortening of the patient's claudication distance or development of rest pain symptoms. No new ulcers or wounds have occurred since the last visit.  There have been no significant changes to the patient's overall health care.  The patient continues to smoke  The patient denies amaurosis fugax or recent TIA symptoms. There are no recent neurological changes noted. The patient denies history of DVT, PE or superficial thrombophlebitis. The patient denies recent episodes of angina or shortness of breath.   ABI Rt=1.06 and Lt=1.09  (previous ABI's Rt=1.05 and Lt=1.04) Duplex ultrasound of the patient has biphasic tibial artery waveforms bilaterally with good toe waveforms bilaterally.   Review of Systems  Musculoskeletal: Positive for arthralgias, back pain and gait problem.  Psychiatric/Behavioral: The patient is nervous/anxious.   All other systems reviewed and are negative.      Objective:   Physical Exam Vitals reviewed.  Constitutional:      Appearance: Normal appearance.  HENT:     Head: Normocephalic.     Nose: Nose normal.  Cardiovascular:     Rate and Rhythm: Normal rate and regular rhythm.     Pulses:          Dorsalis pedis pulses are 2+ on the right side and 2+ on the left side.       Posterior tibial pulses are 2+ on the right side and 2+ on the left side.  Musculoskeletal:     Comments: Ambulates with cane  Skin:    General: Skin is warm.  Neurological:     General: No focal deficit present.     Mental Status: She is alert and oriented to person, place, and time.  Psychiatric:        Mood and Affect: Mood normal.      Behavior: Behavior normal.        Thought Content: Thought content normal.        Judgment: Judgment normal.     BP (!) 178/66   Pulse 69   Resp 12   Ht _0  (1.549 m)   Wt 194 lb (88 kg)   BMI 36.66 kg/m   Past Medical History:  Diagnosis Date  . Allergy    seasonal allergies  . Anemia 2014   needed 5 units of blood d/t passing out, weak  . Anxiety   . Aortic stenosis 12/24/2017   Echo Aug 2018  . Arthritis   . Asthma    allergy induced asthma  . Cardiac murmur 12/12/2017  . Cataract   . Cataract    left  . Complication of anesthesia    arrhythmia following colonoscopy  . Degenerative disc disease, lumbar   . Degenerative disc disease, lumbar   . Depression   . Diabetes mellitus   . Diabetic neuropathy ([facility]) 11/16/2015  . Dysrhythmia    stenosis of left ventricle  . GERD (gastroesophageal reflux disease)   . H/O transfusion    patient was given 5 units of blood while at [facility], blood type O+  . History of chicken pox   . History of hiatal hernia   . History of measles,  mumps, or rubella   . HOH (hard of hearing)    does not use hearing aides yet  . Hyperlipidemia   . Hypertension   . Irregular heartbeat   . LVH (left ventricular hypertrophy) 12/24/2017   Echo Aug 2018  . Neuropathy   . Opiate use 11/16/2015  . Peripheral arterial disease ([place]) 05/06/2018   At rest, left > right; refer to vasc  . Pes planus of both feet 11/16/2015  . Plantar fasciitis of right foot 11/16/2015  . Wheezing     Social History   Socioeconomic History  . Marital status: Divorced    Spouse name: Not on file  . Number of children: 1  . Years of education: Not on file  . Highest education level: Associate degree: academic program  Occupational History  . Occupation: Oceanographer    Comment: [employer]  Tobacco Use  . Smoking status: Current Some Day Smoker    Packs/day: 0.50    Years: 55.00    Pack years: 27.50    Types: Cigarettes     Start date: 07/01/1960    Last attempt to quit: 11/22/2018    Years since quitting: 0.8  . Smokeless tobacco: Never Used  . Tobacco comment: trying to quit again, using chantix  Substance and Sexual Activity  . Alcohol use: Yes    Alcohol/week: 0.0 standard drinks    Comment: 2 drinks per month  . Drug use: Not Currently    Types: Marijuana    Comment: used for pain  . Sexual activity: Not Currently  Other Topics Concern  . Not on file  Social History Narrative  . Not on file   Social Determinants of Health   Financial Resource Strain:   . Difficulty of Paying Living Expenses:   Food Insecurity:   . Worried About Charity fundraiser in the Last Year:   . Arboriculturist in the Last Year:   Transportation Needs:   . Film/video editor (Medical):   Marland Kitchen Lack of Transportation (Non-Medical):   Physical Activity: Insufficiently Active  . Days of Exercise per Week: 7 days  . Minutes of Exercise per Session: 10 min  Stress: Stress Concern Present  . Feeling of Stress : To some extent  Social Connections: Unknown  . Frequency of Communication with Friends and Family: Three times a week  . Frequency of Social Gatherings with Friends and Family: Once a week  . Attends Religious Services: 1 to 4 times per year  . Active Member of Clubs or Organizations: Yes  . Attends Archivist Meetings: More than 4 times per year  . Marital Status: Not on file  Intimate Partner Violence:   . Fear of Current or Ex-Partner:   . Emotionally Abused:   Marland Kitchen Physically Abused:   . Sexually Abused:     Past Surgical History:  Procedure Laterality Date  . ABDOMINAL HYSTERECTOMY    . APPLICATION OF WOUND VAC Left 07/23/2018   Procedure: APPLICATION OF WOUND VAC;  Surgeon: Algernon Huxley, MD;  Location: [facility] ORS;  Service: Vascular;  Laterality: Left;  . banck injections    . CATARACT EXTRACTION W/PHACO Right 05/30/2015   Procedure: CATARACT EXTRACTION PHACO AND INTRAOCULAR LENS PLACEMENT (IOC);   Surgeon: Birder Robson, MD;  Location: [facility] ORS;  Service: Ophthalmology;  Laterality: Right;  [place] 00:57 AP% 20.9 CDE 11.99 fluid pack lot #[id number]  . CHOLECYSTECTOMY  1970  . COLON SURGERY  2013  blocked colon  . COLONOSCOPY WITH PROPOFOL N/A 01/20/2018   Procedure: COLONOSCOPY WITH PROPOFOL;  Surgeon: Lucilla Lame, MD;  Location: [facility] END[facility]COPY;  Service: Endoscopy;  Laterality: N/A;  . ENDARTERECTOMY FEMORAL Left 07/08/2018   Procedure: ENDARTERECTOMY FEMORAL;  Surgeon: Algernon Huxley, MD;  Location: [facility] ORS;  Service: Vascular;  Laterality: Left;  . EYE SURGERY Right    cataract surgery  . FOOT FUSION Right 2018   metal in foot  . GALLBLADDER SURGERY  1970  . GASTRIC BYPASS  2010   lost 178 lbs and regained 40 lbs last few years  . HUMERUS FRACTURE SURGERY Right    metal plate with screws  . internal bleeding  2016   ulcer in past  . LOWER EXTREMITY ANGIOGRAM Left 07/08/2018   Procedure: LOWER EXTREMITY ANGIOGRAM;  Surgeon: Algernon Huxley, MD;  Location: [facility] ORS;  Service: Vascular;  Laterality: Left;  . LOWER EXTREMITY ANGIOGRAPHY Left 05/27/2018   Procedure: LOWER EXTREMITY ANGIOGRAPHY;  Surgeon: Algernon Huxley, MD;  Location: [facility];  Service: Cardiovascular;  Laterality: Left;  . LOWER EXTREMITY ANGIOGRAPHY Right 12/07/2018   Procedure: LOWER EXTREMITY ANGIOGRAPHY;  Surgeon: Algernon Huxley, MD;  Location: [facility];  Service: Cardiovascular;  Laterality: Right;  . LOWER EXTREMITY ANGIOGRAPHY Left 05/10/2019   Procedure: LOWER EXTREMITY ANGIOGRAPHY;  Surgeon: Algernon Huxley, MD;  Location: [facility];  Service: Cardiovascular;  Laterality: Left;  . MOUTH SURGERY     root canals and crowns and extractions  . OVARY SURGERY    . SKIN GRAFT Right 2018   RT foot. foot has been rebuilt.  it is full of metal  . TENOTOMY ACHILLES TENDON Right    Percuntaneous. metal in foot  . TONSILLECTOMY    . WOUND DEBRIDEMENT Left 07/23/2018   Procedure: DEBRIDEMENT  WOUND;  Surgeon: Algernon Huxley, MD;  Location: [facility] ORS;  Service: Vascular;  Laterality: Left;    Family History  Problem Relation Age of Onset  . Asthma Mother   . COPD Mother   . Arthritis Father   . Depression Father   . Heart disease Father   . Hypertension Father   . Stroke Father   . Heart attack Father   . Arthritis Brother   . Depression Brother   . Diabetes Brother   . Heart disease Brother   . Hyperlipidemia Brother   . Hypertension Brother   . Stroke Brother   . Vision loss Brother   . Heart attack Brother   . Diabetes Maternal Grandmother   . Thyroid disease Daughter   . Colitis Daughter   . Breast cancer Neg Hx     Allergies  Allergen Reactions  . Meloxicam Other (See Comments)    GI bleeding  . Nsaids Other (See Comments)    Gi bleeding  . Sitagliptin Hives and Itching    Januvia        Assessment & Plan:   1. Atherosclerosis of artery of extremity with rest pain ([place])  Recommend:  The patient has evidence of atherosclerosis of the lower extremities with claudication.  The patient does not voice lifestyle limiting changes at this point in time.  Noninvasive studies do not suggest clinically significant change.  No invasive studies, angiography or surgery at this time The patient should continue walking and begin a more formal exercise program.  The patient should continue antiplatelet therapy and aggressive treatment of the lipid abnormalities  No changes in the patient's  medications at this time  The patient should continue wearing graduated compression socks 10-15 mmHg strength to control the mild edema.   Patient will follow up in 6 months, sooner if issues arise - VAS [place] ABI WITH/WO TBI; Future  2. Tobacco use disorder Had a discussion with the patient at least about 5 minutes related to continued smoking.  Discussed the effects of tobacco as relates to her peripheral arterial disease and how continued smoking can decrease the longevity of  intervention previous interventions that have been done.  Patient understands and will continue to try to stop.  3. Hypertension with heart disease Continue antihypertensive medications as already ordered, these medications have been reviewed and there are no changes at this time.    Current Outpatient Medications on File Prior to Visit  Medication Sig Dispense Refill  . acetaminophen (TYLENOL) 325 MG tablet Take 325 mg by mouth every 6 (six) hours as needed.     Marland Kitchen albuterol (VENTOLIN HFA) 108 (90 Base) MCG/ACT inhaler Inhale 2 puffs into the lungs every 6 (six) hours as needed for wheezing or shortness of breath. 18 g 2  . atorvastatin (LIPITOR) 10 MG tablet Take 1 tablet (10 mg total) by mouth at bedtime. 90 tablet 3  . cholecalciferol (VITAMIN D3) 25 MCG (1000 UNIT) tablet Take 1,000 Units by mouth daily.    . clindamycin (CLEOCIN) 300 MG capsule Take 300 mg by mouth 3 (three) times daily.    . clopidogrel (PLAVIX) 75 MG tablet Take 1 tablet (75 mg total) by mouth daily. 90 tablet 3  . fluticasone (FLONASE) 50 MCG/ACT nasal spray Place 2 sprays into both nostrils daily as needed. 16 g 4  . gabapentin (NEURONTIN) 400 MG capsule Take 1 capsule (400 mg total) by mouth 2 (two) times daily. 180 capsule 1  . ipratropium-albuterol (DUONEB) 0.5-2.5 (3) MG/3ML SOLN Take 3 mLs by nebulization 3 (three) times daily as needed. 180 mL 1  . lisinopril (ZESTRIL) 10 MG tablet Take 1 tablet (10 mg total) by mouth daily. 90 tablet 3  . montelukast (SINGULAIR) 10 MG tablet Take 1 tablet (10 mg total) by mouth at bedtime. 90 tablet 3  . Multiple Vitamins-Minerals (MULTIVITAMIN ADULTS 50+) TABS Take 1 tablet by mouth daily. 90 tablet 1  . pantoprazole (PROTONIX) 20 MG tablet Take 1 tablet (20 mg total) by mouth daily. 90 tablet 1  . Respiratory Therapy Supplies (NEBULIZER/TUBING/MOUTHPIECE) KIT Disp one nebulizer machine, tubing set and mouthpiece kit 1 kit 0  . sertraline (ZOLOFT) 100 MG tablet TAKE 1 AND 1/2  TABLETS (150 MG TOTAL) BY MOUTH DAILY 135 tablet 3  . Turmeric 500 MG CAPS Take by mouth.    . umeclidinium-vilanterol (ANORO ELLIPTA) 62.5-25 MCG/INH AEPB Inhale 1 puff into the lungs daily. 60 each 11  . varenicline (CHANTIX) 1 MG tablet Take 1 tablet (1 mg total) by mouth 2 (two) times daily. 60 tablet 2  . vitamin C (ASCORBIC ACID) 500 MG tablet Take 500 mg by mouth daily.     . Zinc Sulfate (ZINC 15 PO) Take by mouth.    . co-enzyme Q-10 30 MG capsule Take 100 mg by mouth daily.      No current facility-administered medications on file prior to visit.    There are no Patient Instructions on file for this visit. No follow-ups on file.   Kris Hartmann, NP

## 2019-09-30 ENCOUNTER — Encounter: Payer: Medicare Other | Admitting: Physical Therapy

## 2019-09-30 HISTORY — PX: MOUTH SURGERY: SHX715

## 2019-10-04 ENCOUNTER — Encounter: Payer: Medicare Other | Admitting: Physical Therapy

## 2019-10-07 ENCOUNTER — Encounter: Payer: Medicare Other | Admitting: Physical Therapy

## 2019-10-11 ENCOUNTER — Encounter: Payer: Medicare Other | Admitting: Physical Therapy

## 2019-10-13 LAB — HM DIABETES EYE EXAM

## 2019-10-14 ENCOUNTER — Encounter: Payer: Medicare Other | Admitting: Physical Therapy

## 2019-10-18 ENCOUNTER — Encounter: Payer: Medicare Other | Admitting: Physical Therapy

## 2019-10-21 ENCOUNTER — Encounter: Payer: Medicare Other | Admitting: Physical Therapy

## 2019-10-25 ENCOUNTER — Encounter: Payer: Medicare Other | Admitting: Physical Therapy

## 2019-10-28 ENCOUNTER — Encounter: Payer: Medicare Other | Admitting: Physical Therapy

## 2019-11-02 ENCOUNTER — Encounter: Payer: Self-pay | Admitting: Ophthalmology

## 2019-11-08 ENCOUNTER — Other Ambulatory Visit
Admission: RE | Admit: 2019-11-08 | Discharge: 2019-11-08 | Disposition: A | Discharge status: Home / Self Care | Payer: Medicare (Managed Care) | Source: Ambulatory Visit | Attending: Ophthalmology | Admitting: Ophthalmology

## 2019-11-08 DIAGNOSIS — Z20822 Contact with and (suspected) exposure to covid-19: Secondary | ICD-10-CM | POA: Diagnosis not present

## 2019-11-08 DIAGNOSIS — Z01812 Encounter for preprocedural laboratory examination: Secondary | ICD-10-CM | POA: Insufficient documentation

## 2019-11-08 LAB — SARS CORONAVIRUS 2 (TAT 6-24 HRS): SARS Coronavirus 2: NEGATIVE

## 2019-11-09 ENCOUNTER — Other Ambulatory Visit: Payer: Medicare (Managed Care)

## 2019-11-11 ENCOUNTER — Encounter
Admission: RE | Disposition: A | Discharge status: Home / Self Care | Payer: Self-pay | Source: Home / Self Care | Attending: Ophthalmology

## 2019-11-11 ENCOUNTER — Encounter: Payer: Self-pay | Admitting: Ophthalmology

## 2019-11-11 ENCOUNTER — Ambulatory Visit
Admission: RE | Admit: 2019-11-11 | Discharge: 2019-11-11 | Disposition: A | Discharge status: Home / Self Care | Payer: Medicare (Managed Care) | Attending: Ophthalmology | Admitting: Ophthalmology

## 2019-11-11 ENCOUNTER — Other Ambulatory Visit: Payer: Self-pay

## 2019-11-11 ENCOUNTER — Ambulatory Visit: Payer: Medicare (Managed Care) | Admitting: Certified Registered"

## 2019-11-11 DIAGNOSIS — M1990 Unspecified osteoarthritis, unspecified site: Secondary | ICD-10-CM | POA: Diagnosis not present

## 2019-11-11 DIAGNOSIS — Z7902 Long term (current) use of antithrombotics/antiplatelets: Secondary | ICD-10-CM | POA: Diagnosis not present

## 2019-11-11 DIAGNOSIS — J449 Chronic obstructive pulmonary disease, unspecified: Secondary | ICD-10-CM | POA: Insufficient documentation

## 2019-11-11 DIAGNOSIS — E1136 Type 2 diabetes mellitus with diabetic cataract: Secondary | ICD-10-CM | POA: Diagnosis not present

## 2019-11-11 DIAGNOSIS — Z9884 Bariatric surgery status: Secondary | ICD-10-CM | POA: Insufficient documentation

## 2019-11-11 DIAGNOSIS — H2512 Age-related nuclear cataract, left eye: Secondary | ICD-10-CM | POA: Insufficient documentation

## 2019-11-11 DIAGNOSIS — K219 Gastro-esophageal reflux disease without esophagitis: Secondary | ICD-10-CM | POA: Insufficient documentation

## 2019-11-11 DIAGNOSIS — F329 Major depressive disorder, single episode, unspecified: Secondary | ICD-10-CM | POA: Insufficient documentation

## 2019-11-11 DIAGNOSIS — Z87891 Personal history of nicotine dependence: Secondary | ICD-10-CM | POA: Diagnosis not present

## 2019-11-11 DIAGNOSIS — Z79899 Other long term (current) drug therapy: Secondary | ICD-10-CM | POA: Insufficient documentation

## 2019-11-11 DIAGNOSIS — E1151 Type 2 diabetes mellitus with diabetic peripheral angiopathy without gangrene: Secondary | ICD-10-CM | POA: Diagnosis not present

## 2019-11-11 DIAGNOSIS — E1121 Type 2 diabetes mellitus with diabetic nephropathy: Secondary | ICD-10-CM | POA: Insufficient documentation

## 2019-11-11 DIAGNOSIS — M810 Age-related osteoporosis without current pathological fracture: Secondary | ICD-10-CM | POA: Diagnosis not present

## 2019-11-11 DIAGNOSIS — I10 Essential (primary) hypertension: Secondary | ICD-10-CM | POA: Insufficient documentation

## 2019-11-11 HISTORY — PX: CATARACT EXTRACTION W/PHACO: SHX586

## 2019-11-11 HISTORY — DX: Pneumonia, unspecified organism: J18.9

## 2019-11-11 LAB — GLUCOSE, CAPILLARY
Glucose-Capillary: 111 mg/dL — ABNORMAL HIGH (ref 70–99)
Glucose-Capillary: 111 mg/dL — ABNORMAL HIGH (ref 70–99)

## 2019-11-11 SURGERY — PHACOEMULSIFICATION, CATARACT, WITH IOL INSERTION
Anesthesia: Monitor Anesthesia Care | Laterality: Left

## 2019-11-11 MED ORDER — CARBACHOL 0.01 % IO SOLN
INTRAOCULAR | Status: DC | PRN
  Administered 2019-11-11: 0.5 mL via INTRAOCULAR

## 2019-11-11 MED ORDER — NA CHONDROIT SULF-NA HYALURON 40-17 MG/ML IO SOLN
INTRAOCULAR | Status: DC | PRN
  Administered 2019-11-11: 1 mL via INTRAOCULAR

## 2019-11-11 MED ORDER — ARMC OPHTHALMIC DILATING DROPS
OPHTHALMIC | Status: AC
  Filled 2019-11-11: qty 0.5

## 2019-11-11 MED ORDER — EPINEPHRINE PF 1 MG/ML IJ SOLN
INTRAOCULAR | Status: DC | PRN
  Administered 2019-11-11: 200 mL via OPHTHALMIC

## 2019-11-11 MED ORDER — TETRACAINE HCL 0.5 % OP SOLN
OPHTHALMIC | Status: AC
  Administered 2019-11-11 (×3): 1 [drp]
  Filled 2019-11-11: qty 4

## 2019-11-11 MED ORDER — MOXIFLOXACIN HCL 0.5 % OP SOLN
OPHTHALMIC | Status: DC | PRN
  Administered 2019-11-11: 0.2 mL via OPHTHALMIC

## 2019-11-11 MED ORDER — MOXIFLOXACIN HCL 0.5 % OP SOLN - NO CHARGE
1.0000 [drp] | Freq: Once | OPHTHALMIC | Status: DC
  Filled 2019-11-11: qty 3

## 2019-11-11 MED ORDER — MOXIFLOXACIN HCL 0.5 % OP SOLN
OPHTHALMIC | Status: AC
  Filled 2019-11-11: qty 3

## 2019-11-11 MED ORDER — ONDANSETRON HCL 4 MG/2ML IJ SOLN
INTRAMUSCULAR | Status: DC | PRN
  Administered 2019-11-11: 4 mg via INTRAVENOUS

## 2019-11-11 MED ORDER — LIDOCAINE HCL (PF) 4 % IJ SOLN
INTRAOCULAR | Status: DC | PRN
  Administered 2019-11-11: 2 mL via OPHTHALMIC

## 2019-11-11 MED ORDER — ONDANSETRON HCL 4 MG/2ML IJ SOLN
INTRAMUSCULAR | Status: AC
  Filled 2019-11-11: qty 8

## 2019-11-11 MED ORDER — SUCCINYLCHOLINE CHLORIDE 200 MG/10ML IV SOSY
PREFILLED_SYRINGE | INTRAVENOUS | Status: AC
  Filled 2019-11-11: qty 10

## 2019-11-11 MED ORDER — ROCURONIUM BROMIDE 10 MG/ML (PF) SYRINGE
PREFILLED_SYRINGE | INTRAVENOUS | Status: AC
  Filled 2019-11-11: qty 10

## 2019-11-11 MED ORDER — POVIDONE-IODINE 5 % OP SOLN
OPHTHALMIC | Status: DC | PRN
  Administered 2019-11-11: 1 via OPHTHALMIC

## 2019-11-11 MED ORDER — SODIUM CHLORIDE 0.9 % IV SOLN: INTRAVENOUS | Status: DC

## 2019-11-11 MED ORDER — MIDAZOLAM HCL 2 MG/2ML IJ SOLN
INTRAMUSCULAR | Status: DC | PRN
  Administered 2019-11-11 (×2): .5 mg via INTRAVENOUS

## 2019-11-11 MED ORDER — ARMC OPHTHALMIC DILATING GEL
1.0000 "application " | Freq: Once | OPHTHALMIC | Status: AC
  Administered 2019-11-11 (×3): 1 via OPHTHALMIC
  Filled 2019-11-11: qty 0.25

## 2019-11-11 MED ORDER — TETRACAINE HCL 0.5 % OP SOLN: 1.0000 [drp] | Freq: Once | OPHTHALMIC | Status: DC

## 2019-11-11 MED ORDER — POVIDONE-IODINE 5 % OP SOLN: 1.0000 "application " | Freq: Once | OPHTHALMIC | Status: DC

## 2019-11-11 MED ORDER — PROPOFOL 10 MG/ML IV BOLUS
INTRAVENOUS | Status: AC
  Filled 2019-11-11: qty 20

## 2019-11-11 MED ORDER — LIDOCAINE HCL (PF) 2 % IJ SOLN
INTRAMUSCULAR | Status: AC
  Filled 2019-11-11: qty 5

## 2019-11-11 MED ORDER — FENTANYL CITRATE (PF) 100 MCG/2ML IJ SOLN
INTRAMUSCULAR | Status: AC
  Filled 2019-11-11: qty 2

## 2019-11-11 MED ORDER — MIDAZOLAM HCL 2 MG/2ML IJ SOLN
INTRAMUSCULAR | Status: AC
  Filled 2019-11-11: qty 2

## 2019-11-11 SURGICAL SUPPLY — 17 items
GLOVE BIO SURGEON STRL SZ8 (GLOVE) ×3 IMPLANT
GLOVE BIOGEL M 6.5 STRL (GLOVE) ×3 IMPLANT
GLOVE SURG LX 8.0 MICRO (GLOVE) ×2
GLOVE SURG LX STRL 8.0 MICRO (GLOVE) ×1 IMPLANT
GOWN STRL REUS W/ TWL LRG LVL3 (GOWN DISPOSABLE) ×2 IMPLANT
GOWN STRL REUS W/TWL LRG LVL3 (GOWN DISPOSABLE) ×4
LABEL CATARACT MEDS ST (LABEL) ×3 IMPLANT
LENS IOL DIOP 21.5 (Intraocular Lens) ×3 IMPLANT
LENS IOL TECNIS MONO 21.5 (Intraocular Lens) IMPLANT
PACK CATARACT (MISCELLANEOUS) ×3 IMPLANT
PACK CATARACT BRASINGTON LX (MISCELLANEOUS) ×3 IMPLANT
PACK EYE AFTER SURG (MISCELLANEOUS) ×3 IMPLANT
SOL BSS BAG (MISCELLANEOUS) ×3
SOLUTION BSS BAG (MISCELLANEOUS) ×1 IMPLANT
SYR 5ML LL (SYRINGE) ×3 IMPLANT
WATER STERILE IRR 250ML POUR (IV SOLUTION) ×3 IMPLANT
WIPE NON LINTING 3.25X3.25 (MISCELLANEOUS) ×3 IMPLANT

## 2019-11-11 NOTE — Anesthesia Postprocedure Evaluation (Signed)
Anesthesia Post Note  Patient: Andrea Holden  Procedure(s) Performed: CATARACT EXTRACTION PHACO AND INTRAOCULAR LENS PLACEMENT (IOC) LEFT DIABETIC (Left )  Patient location during evaluation: Phase II Anesthesia Type: MAC Level of consciousness: awake and alert Pain management: pain level controlled Vital Signs Assessment: post-procedure vital signs reviewed and stable Respiratory status: spontaneous breathing, nonlabored ventilation and respiratory function stable Cardiovascular status: stable and blood pressure returned to baseline Postop Assessment: no apparent nausea or vomiting Anesthetic complications: no     Last Vitals:  Vitals:   11/11/19 0756 11/11/19 0952  BP: (!) 158/73 (!) 128/53  Pulse: 65 (!) 58  Resp: 16 16  Temp: 36.7 C (!) 36.3 C  SpO2: 97% 100%    Last Pain:  Vitals:   11/11/19 0952  TempSrc: Temporal  PainSc: 0-No pain                 Alphonsus Sias

## 2019-11-11 NOTE — Discharge Instructions (Addendum)
Eye Surgery Discharge Instructions  Expect mild scratchy sensation or mild soreness. DO NOT RUB YOUR EYE!  The day of surgery: . Minimal physical activity, but bed rest is not required . No reading, computer work, or close hand work . No bending, lifting, or straining. . May watch TV  For 24 hours: . No driving, legal decisions, or alcoholic beverages . Safety precautions . Eat anything you prefer: It is better to start with liquids, then soup then solid foods. . Solar shield eyeglasses should be worn for comfort in the sunlight/patch while sleeping  Resume all regular medications including aspirin or Coumadin if these were discontinued prior to surgery. You may shower, bathe, shave, or wash your hair. Tylenol may be taken for mild discomfort. Follow Dr. Gerome Sam eye drop instruction sheet as reviewed.   Call your doctor if you experience significant pain, nausea, or vomiting, fever > 101 or other signs of infection. [phone] or [phone] Specific instructions:  Follow-up Information    Galen Manila, MD Follow up.   Specialty: Ophthalmology Why: 11/12/19 @ 10:50 am Contact information: [address] [place] [address] [phone]

## 2019-11-11 NOTE — Transfer of Care (Signed)
Immediate Anesthesia Transfer of Care Note  Patient: Andrea Holden  Procedure(s) Performed: CATARACT EXTRACTION PHACO AND INTRAOCULAR LENS PLACEMENT (IOC) LEFT DIABETIC (Left )  Patient Location: PACU  Anesthesia Type:MAC  Level of Consciousness: awake, alert  and patient cooperative  Airway & Oxygen Therapy: Patient Spontanous Breathing  Post-op Assessment: Report given to RN and Post -op Vital signs reviewed and stable  Post vital signs: Reviewed and stable  Last Vitals:  Vitals Value Taken Time  BP    Temp    Pulse    Resp    SpO2      Last Pain:  Vitals:   11/11/19 0756  TempSrc: Temporal  PainSc: 0-No pain         Complications: No apparent anesthesia complications

## 2019-11-11 NOTE — Op Note (Signed)
PREOPERATIVE DIAGNOSIS:  Nuclear sclerotic cataract of the left eye.   POSTOPERATIVE DIAGNOSIS:  Nuclear sclerotic cataract of the left eye.   OPERATIVE PROCEDURE: Procedure(s): CATARACT EXTRACTION PHACO AND INTRAOCULAR LENS PLACEMENT (IOC) LEFT DIABETIC   SURGEON:  Galen Manila, MD.   ANESTHESIA:  Anesthesiologist: Christia Reading, MD CRNA: Mohammed Kindle, CRNA  1.      Managed anesthesia care. 2.     0.55ml of Shugarcaine was instilled following the paracentesis   COMPLICATIONS:  None.   TECHNIQUE:   Stop and chop   DESCRIPTION OF PROCEDURE:  The patient was examined and consented in the preoperative holding area where the aforementioned topical anesthesia was applied to the left eye and then brought back to the Operating Room where the left eye was prepped and draped in the usual sterile ophthalmic fashion and a lid speculum was placed. A paracentesis was created with the side port blade and the anterior chamber was filled with viscoelastic. A near clear corneal incision was performed with the steel keratome. A continuous curvilinear capsulorrhexis was performed with a cystotome followed by the capsulorrhexis forceps. Hydrodissection and hydrodelineation were carried out with BSS on a blunt cannula. The lens was removed in a stop and chop  technique and the remaining cortical material was removed with the irrigation-aspiration handpiece. The capsular bag was inflated with viscoelastic and the Technis [id number] lens was placed in the capsular bag without complication. The remaining viscoelastic was removed from the eye with the irrigation-aspiration handpiece. The wounds were hydrated. The anterior chamber was flushed with Miostat and the eye was inflated to physiologic pressure. 0.47ml Vigamox was placed in the anterior chamber. The wounds were found to be water tight. The eye was dressed with Vigamox. The patient was given protective glasses to wear throughout the day and a shield  with which to sleep tonight. The patient was also given drops with which to begin a drop regimen today and will follow-up with me in one day. Implant Name Type Inv. Item Serial No. Manufacturer Lot No. LRB No. Used Action  LENS IOL DIOP 21.5 - [id number] Intraocular Lens LENS IOL DIOP 21.5 [id number]  Left 1 Implanted    Procedure(s) with comments: CATARACT EXTRACTION PHACO AND INTRAOCULAR LENS PLACEMENT (IOC) LEFT DIABETIC (Left) - Lot #[id number] H [place]: 00:47.5 CDE: 6.24  Electronically signed: Galen Manila 11/11/2019 9:50 AM

## 2019-11-11 NOTE — Anesthesia Preprocedure Evaluation (Signed)
Anesthesia Evaluation  Patient identified by MRN, date of birth, ID band Patient awake    Reviewed: Allergy & Precautions, H&P , NPO status , reviewed documented beta blocker date and time   History of Anesthesia Complications (+) history of anesthetic complications  Airway Mallampati: III  TM Distance: >3 FB Neck ROM: limited    Dental  (+) Partial Lower, Caps, Chipped, Missing   Pulmonary shortness of breath, asthma , pneumonia, COPD, Current Smoker and Patient abstained from smoking.,     + decreased breath sounds      Cardiovascular hypertension, + angina + CAD and + Peripheral Vascular Disease  Normal cardiovascular exam+ dysrhythmias + Valvular Problems/Murmurs   05/2018 ECHO Study Conclusions   - Left ventricle: The cavity size was normal. There was mild  concentric hypertrophy. Systolic function was normal. The  estimated ejection fraction was in the range of 60% to 65%. Wall  motion was normal; there were no regional wall motion  abnormalities. Doppler parameters are consistent with abnormal  left ventricular relaxation (grade 1 diastolic dysfunction).  - Aortic valve: Trileaflet; severely calcified leaflets. There was  mild stenosis. Peak velocity (S): 288 cm/s. Mean gradient (S): 17  mm Hg. Valve area (VTI): 1.31 cm^2.  - Left atrium: The atrium was mildly dilated.  - Right ventricle: Systolic function was normal.  - Pulmonary arteries: Systolic pressure was within the normal  range.    Neuro/Psych PSYCHIATRIC DISORDERS Anxiety Depression    GI/Hepatic hiatal hernia, PUD, GERD  Controlled,  Endo/Other  diabetes  Renal/GU      Musculoskeletal  (+) Arthritis ,   Abdominal   Peds  Hematology  (+) Blood dyscrasia, anemia ,   Anesthesia Other Findings Past Medical History: No date: Allergy     Comment:  seasonal allergies 2014: Anemia     Comment:  needed 5 units of blood d/t passing  out, weak No date: Anxiety 12/24/2017: Aortic stenosis     Comment:  Echo Aug 2018 No date: Arthritis No date: Asthma     Comment:  allergy induced asthma 12/12/2017: Cardiac murmur No date: Cataract No date: Cataract     Comment:  left No date: Complication of anesthesia     Comment:  arrhythmia following colonoscopy No date: Degenerative disc disease, lumbar No date: Degenerative disc disease, lumbar No date: Depression No date: Diabetes mellitus 11/16/2015: Diabetic neuropathy (HCC) No date: Dysrhythmia     Comment:  stenosis of left ventricle No date: GERD (gastroesophageal reflux disease) No date: H/O transfusion     Comment:  patient was given 5 units of blood while at [place],               blood type O+ No date: History of chicken pox No date: History of hiatal hernia No date: History of measles, mumps, or rubella No date: HOH (hard of hearing)     Comment:  does not use hearing aides yet No date: Hyperlipidemia No date: Hypertension No date: Irregular heartbeat 12/24/2017: LVH (left ventricular hypertrophy)     Comment:  Echo Aug 2018 No date: Neuropathy 11/16/2015: Opiate use 05/06/2018: Peripheral arterial disease ([place])     Comment:  At rest, left > right; refer to vasc 11/16/2015: Pes planus of both feet 11/16/2015: Plantar fasciitis of right foot No date: Pneumonia No date: Wheezing  Past Surgical History: No date: ABDOMINAL HYSTERECTOMY 0/25/4270: APPLICATION OF WOUND VAC; Left     Comment:  Procedure: APPLICATION OF WOUND VAC;  Surgeon: Lucky Cowboy,  Erskine Squibb, MD;  Location: [facility];  Service: Vascular;                Laterality: Left; No date: banck injections 05/30/2015: CATARACT EXTRACTION W/PHACO; Right     Comment:  Procedure: CATARACT EXTRACTION PHACO AND INTRAOCULAR               LENS PLACEMENT (IOC);  Surgeon: Birder Robson, MD;                Location: [facility];  Service: Ophthalmology;  Laterality:              Right;  [place] 00:57 AP%  20.9 CDE 11.99 fluid pack lot               #[id number] H 1970: CHOLECYSTECTOMY 2013: COLON SURGERY     Comment:  blocked colon 01/20/2018: COLONOSCOPY WITH PROPOFOL; N/A     Comment:  Procedure: COLONOSCOPY WITH PROPOFOL;  Surgeon: Lucilla Lame, MD;  Location: [facility];  Service:               Endoscopy;  Laterality: N/A; 07/08/2018: ENDARTERECTOMY FEMORAL; Left     Comment:  Procedure: ENDARTERECTOMY FEMORAL;  Surgeon: Algernon Huxley, MD;  Location: [facility];  Service: Vascular;                Laterality: Left; No date: EYE SURGERY; Right     Comment:  cataract surgery 2018: FOOT FUSION; Right     Comment:  metal in foot 1970: GALLBLADDER SURGERY 2010: GASTRIC BYPASS     Comment:  lost 178 lbs and regained 40 lbs last few years No date: HUMERUS FRACTURE SURGERY; Right     Comment:  metal plate with screws 6384: internal bleeding     Comment:  ulcer in past 07/08/2018: [place]; Left     Comment:  Procedure: LOWER EXTREMITY ANGIOGRAM;  Surgeon: Algernon Huxley, MD;  Location: [facility];  Service: Vascular;                Laterality: Left; 05/27/2018: LOWER EXTREMITY ANGIOGRAPHY; Left     Comment:  Procedure: LOWER EXTREMITY ANGIOGRAPHY;  Surgeon: Algernon Huxley, MD;  Location: [place] [facility];  Service:               Cardiovascular;  Laterality: Left; 12/07/2018: LOWER EXTREMITY ANGIOGRAPHY; Right     Comment:  Procedure: LOWER EXTREMITY ANGIOGRAPHY;  Surgeon: Algernon Huxley, MD;  Location: [place] [facility];  Service:               Cardiovascular;  Laterality: Right; 05/10/2019: LOWER EXTREMITY ANGIOGRAPHY; Left     Comment:  Procedure: LOWER EXTREMITY ANGIOGRAPHY;  Surgeon: Algernon Huxley, MD;  Location: [place] [facility];  Service:               Cardiovascular;  Laterality: Left; No date: MOUTH SURGERY     Comment:  root canals and crowns and extractions No date: OVARY  SURGERY  2018: SKIN GRAFT; Right     Comment:  RT foot. foot has been rebuilt.  it is full of metal No date: TENOTOMY ACHILLES TENDON; Right     Comment:  Percuntaneous. metal in foot No date: TONSILLECTOMY 07/23/2018: WOUND DEBRIDEMENT; Left     Comment:  Procedure: DEBRIDEMENT WOUND;  Surgeon: Algernon Huxley,               MD;  Location: [facility];  Service: Vascular;  Laterality:              Left;  BMI    Body Mass Index: 36.05 kg/m      Reproductive/Obstetrics                             Anesthesia Physical Anesthesia Plan  ASA: IV  Anesthesia Plan: MAC   Post-op Pain Management:    Induction: Intravenous  PONV Risk Score and Plan: 1 and Treatment may vary due to age or medical condition, TIVA and Midazolam  Airway Management Planned: Nasal Cannula and Natural Airway  Additional Equipment:   Intra-op Plan:   Post-operative Plan:   Informed Consent: I have reviewed the patients History and Physical, chart, labs and discussed the procedure including the risks, benefits and alternatives for the proposed anesthesia with the patient or authorized representative who has indicated his/her understanding and acceptance.     Dental Advisory Given  Plan Discussed with: CRNA  Anesthesia Plan Comments:         Anesthesia Quick Evaluation

## 2019-11-11 NOTE — H&P (Signed)
All labs reviewed. Abnormal studies sent to patients PCP when indicated.  Previous H&P reviewed, patient examined, there are NO CHANGES.  Chrissie Noa Porfilio5/13/20218:09 AM

## 2019-11-22 ENCOUNTER — Telehealth: Payer: Self-pay | Admitting: Family Medicine

## 2019-11-22 NOTE — Chronic Care Management (AMB) (Signed)
  Care Management   Note  11/22/2019 Name: [place] Holden MRN: [id number] DOB: 01/10/1946  [place] Holden is a 74 y.o. year old female who is a primary care patient of Sander Radon and is actively engaged with the care management team. I reached out to [employer] by phone today to assist with scheduling an initial visit with the Pharmacist  Follow up plan: A telephone outreach attempt made patient will call back 5/26 to schedule. The care management team will reach out to the patient again over the next 7 days. If patient returns call to provider office, please advise to call Embedded Care Management Care Guide Gwenevere Ghazi at [phone].  Gwenevere Ghazi  Care Guide, Embedded Care Coordination [facility]  [place], [place] [address] Direct Dial: [phone] [email or web address] Website: [email or web address]

## 2019-11-24 NOTE — Chronic Care Management (AMB) (Signed)
  Chronic Care Management   Outreach Note  11/24/2019 Name: [place] Holden MRN: [id number] DOB: 1946/01/06  Andrea Holden is a 74 y.o. year old female who is a primary care patient of Danelle Berry, [place]. I reached out to [employer] by phone today in response to a referral sent by Andrea Holden's herself     An unsuccessful telephone outreach was attempted today. The patient was referred to the case management team for assistance with care management and care coordination. Patient would like assistance with smoking cessation and cost of Chantix sent a referral to [facility].    Follow Up Plan: A HIPPA compliant phone message was left for the patient providing contact information and requesting a return call. The care management team will reach out to the patient again over the next 7 days.  If patient returns call to provider office, please advise to call Embedded Care Management Care Guide Gwenevere Ghazi at [phone].  Gwenevere Ghazi  Care Guide, Embedded Care Coordination [facility]  [place], [place] [address] Direct Dial: [phone] [email or web address] Website: [email or web address]

## 2019-11-25 NOTE — Chronic Care Management (AMB) (Signed)
  Care Management   Note  11/25/2019 Name: TYIESHA BRACKNEY MRN: [id number] DOB: 1946/03/12  Berlin Hun Neenan is a 74 y.o. year old female who is a primary care patient of Sander Radon and is actively engaged with the care management team. I reached out to [employer] by phone today to assist with making her aware that the care guide community resource team will be calling her over the next 7 days to discuss options for smoking cessation and to discuss medication assistance for Chantix. Mahogony J Gadomski also ask what her cardiologist name and number was so she could make an appointment. Made patient aware of practice name and cardiologist name. [facility] Dr. Mariah Milling and his number is [phone].    Gwenevere Ghazi  Care Guide, Embedded Care Coordination [facility]  [place], [place] [address] Direct Dial: [phone] [email or web address] Website: [email or web address]

## 2019-11-30 ENCOUNTER — Encounter: Payer: Self-pay | Admitting: Family Medicine

## 2019-11-30 ENCOUNTER — Ambulatory Visit (INDEPENDENT_AMBULATORY_CARE_PROVIDER_SITE_OTHER): Payer: Medicare (Managed Care) | Admitting: Family Medicine

## 2019-11-30 ENCOUNTER — Other Ambulatory Visit: Payer: Self-pay

## 2019-11-30 VITALS — BP 132/68 | HR 71 | Temp 97.9°F | Resp 14 | Ht 61.0 in | Wt 189.5 lb

## 2019-11-30 DIAGNOSIS — R32 Unspecified urinary incontinence: Secondary | ICD-10-CM

## 2019-11-30 DIAGNOSIS — Z5181 Encounter for therapeutic drug level monitoring: Secondary | ICD-10-CM

## 2019-11-30 DIAGNOSIS — B351 Tinea unguium: Secondary | ICD-10-CM

## 2019-11-30 DIAGNOSIS — E1142 Type 2 diabetes mellitus with diabetic polyneuropathy: Secondary | ICD-10-CM | POA: Diagnosis not present

## 2019-11-30 DIAGNOSIS — E782 Mixed hyperlipidemia: Secondary | ICD-10-CM

## 2019-11-30 DIAGNOSIS — L600 Ingrowing nail: Secondary | ICD-10-CM

## 2019-11-30 DIAGNOSIS — I25118 Atherosclerotic heart disease of native coronary artery with other forms of angina pectoris: Secondary | ICD-10-CM

## 2019-11-30 DIAGNOSIS — I739 Peripheral vascular disease, unspecified: Secondary | ICD-10-CM

## 2019-11-30 DIAGNOSIS — J449 Chronic obstructive pulmonary disease, unspecified: Secondary | ICD-10-CM

## 2019-11-30 DIAGNOSIS — F17200 Nicotine dependence, unspecified, uncomplicated: Secondary | ICD-10-CM

## 2019-11-30 NOTE — Patient Instructions (Addendum)
Let me know if you would like a referral to a specialist or would like to try a prescription medication for bladder symptoms.   Urinary Incontinence  Urinary incontinence refers to a condition in which a person is unable to control where and when to pass urine. A person with this condition will urinate when he or she does not mean to (involuntarily). What are the causes? This condition may be caused by:  Medicines.  Infections.  Constipation.  Overactive bladder muscles.  Weak bladder muscles.  Weak pelvic floor muscles. These muscles provide support for the bladder, intestine, and, in women, the uterus.  Enlarged prostate in men. The prostate is a gland near the bladder. When it gets too big, it can pinch the urethra. With the urethra blocked, the bladder can weaken and lose the ability to empty properly.  Surgery.  Emotional factors, such as anxiety, stress, or post-traumatic stress disorder (PTSD).  Pelvic organ prolapse. This happens in women when organs shift out of place and into the vagina. This shift can prevent the bladder and urethra from working properly. What increases the risk? The following factors may make you more likely to develop this condition:  Older age.  Obesity and physical inactivity.  Pregnancy and childbirth.  Menopause.  Diseases that affect the nerves or spinal cord (neurological diseases).  Long-term (chronic) coughing. This can increase pressure on the bladder and pelvic floor muscles. What are the signs or symptoms? Symptoms may vary depending on the type of urinary incontinence you have. They include:  A sudden urge to urinate, but passing urine involuntarily before you can get to a bathroom (urge incontinence).  Suddenly passing urine with any activity that forces urine to pass, such as coughing, laughing, exercise, or sneezing (stress incontinence).  Needing to urinate often, but urinating only a small amount, or constantly dribbling  urine (overflow incontinence).  Urinating because you cannot get to the bathroom in time due to a physical disability, such as arthritis or injury, or communication and thinking problems, such as Alzheimer disease (functional incontinence). How is this diagnosed? This condition may be diagnosed based on:  Your medical history.  A physical exam.  Tests, such as: ? Urine tests. ? X-rays of your kidney and bladder. ? Ultrasound. ? CT scan. ? Cystoscopy. In this procedure, a health care provider inserts a tube with a light and camera (cystoscope) through the urethra and into the bladder in order to check for problems. ? Urodynamic testing. These tests assess how well the bladder, urethra, and sphincter can store and release urine. There are different types of urodynamic tests, and they vary depending on what the test is measuring. To help diagnose your condition, your health care provider may recommend that you keep a log of when you urinate and how much you urinate. How is this treated? Treatment for this condition depends on the type of incontinence that you have and its cause. Treatment may include:  Lifestyle changes, such as: ? Quitting smoking. ? Maintaining a healthy weight. ? Staying active. Try to get 150 minutes of moderate-intensity exercise every week. Ask your health care provider which activities are safe for you. ? Eating a healthy diet.  Avoid high-fat foods, like fried foods.  Avoid refined carbohydrates like white bread and white rice.  Limit how much alcohol and caffeine you drink.  Increase your fiber intake. Foods such as fresh fruits, vegetables, beans, and whole grains are healthy sources of fiber.  Pelvic floor muscle exercises.  Bladder  training, such as lengthening the amount of time between bathroom breaks, or using the bathroom at regular intervals.  Using techniques to suppress bladder urges. This can include distraction techniques or controlled  breathing exercises.  Medicines to relax the bladder muscles and prevent bladder spasms.  Medicines to help slow or prevent the growth of a man's prostate.  Botox injections. These can help relax the bladder muscles.  Using pulses of electricity to help change bladder reflexes (electrical nerve stimulation).  For women, using a medical device to prevent urine leaks. This is a small, tampon-like, disposable device that is inserted into the urethra.  Injecting collagen or carbon beads (bulking agents) into the urinary sphincter. These can help thicken tissue and close the bladder opening.  Surgery. Follow these instructions at home: Lifestyle  Limit alcohol and caffeine. These can fill your bladder quickly and irritate it.  Keep yourself clean to help prevent odors and skin damage. Ask your doctor about special skin creams and cleansers that can protect the skin from urine.  Consider wearing pads or adult diapers. Make sure to change them regularly, and always change them right after experiencing incontinence. General instructions  Take over-the-counter and prescription medicines only as told by your health care provider.  Use the bathroom about every 3-4 hours, even if you do not feel the need to urinate. Try to empty your bladder completely every time. After urinating, wait a minute. Then try to urinate again.  Make sure you are in a relaxed position while urinating.  If your incontinence is caused by nerve problems, keep a log of the medicines you take and the times you go to the bathroom.  Keep all follow-up visits as told by your health care provider. This is important. Contact a health care provider if:  You have pain that gets worse.  Your incontinence gets worse. Get help right away if:  You have a fever or chills.  You are unable to urinate.  You have redness in your groin area or down your legs. Summary  Urinary incontinence refers to a condition in which a  person is unable to control where and when to pass urine.  This condition may be caused by medicines, infection, weak bladder muscles, weak pelvic floor muscles, enlargement of the prostate (in men), or surgery.  The following factors increase your risk for developing this condition: older age, obesity, pregnancy and childbirth, menopause, neurological diseases, and chronic coughing.  There are several types of urinary incontinence. They include urge incontinence, stress incontinence, overflow incontinence, and functional incontinence.  This condition is usually treated first with lifestyle and behavioral changes, such as quitting smoking, eating a healthier diet, and doing regular pelvic floor exercises. Other treatment options include medicines, bulking agents, medical devices, electrical nerve stimulation, or surgery. This information is not intended to replace advice given to you by your health care provider. Make sure you discuss any questions you have with your health care provider. Document Revised: 06/27/2017 Document Reviewed: 09/26/2016 Elsevier Patient Education  2020 [employer].  [facility] Guide, Embedded Care Coordination [facility]  [place], [place] [address] Direct Dial: [phone] [email or web address] Website: [email or web address]

## 2019-11-30 NOTE — Progress Notes (Signed)
Name: Andrea Holden   MRN: [id number]    DOB: 1946-02-16   Date:11/30/2019       Progress Note  Chief Complaint  Patient presents with  . Follow-up  . Hypertension  . Depression  . Hyperlipidemia     Subjective:   Andrea Holden is a 74 y.o. female, presents to clinic for routine follow up on the conditions listed above.  Hypertension:   Currently managed on lisinopril 10 mg Pt reports good med compliance and denies any SE.  No lightheadedness, hypotension, syncope. Blood pressure today is well controlled. BP Readings from Last 3 Encounters:  11/30/19 132/68  11/11/19 (!) 128/53  09/24/19 (!) 178/66  Pt denies CP, SOB, exertional sx, LE edema, palpitation, Ha's, visual disturbances  Hyperlipidemia: Current Medication Regimen:  lipitor 10 mg daily, good compliance  Last Lipids: Lab Results  Component Value Date   CHOL 129 06/03/2019   HDL 43 (L) 06/03/2019   LDLCALC 67 06/03/2019   TRIG 109 06/03/2019   CHOLHDL 3.0 06/03/2019  - Denies: Chest pain, shortness of breath, myalgias.  Toenails great toenails are getting ingrown and bopthering her, pt wants f/up with podiatrist, she did not like her experience with podiatrist at [facility]  Diabetes Mellitus Type II: Hx of being well controlled with diet and lifestyle  Recent pertinent labs: Lab Results  Component Value Date   HGBA1C 6.6 (H) 06/03/2019   HGBA1C 6.9 (H) 12/18/2018   HGBA1C 6.8 (H) 04/23/2018   Pt is  DM foot exam and eye exam  Depression screen [facility] 2/9 11/30/2019 08/24/2019 05/24/2019  Decreased Interest 0 0 1  Down, Depressed, Hopeless 1 1 -  PHQ - 2 Score '1 1 1  ' Altered sleeping 1 0 1  Tired, decreased energy 0 1 1  Change in appetite 0 1 0  Feeling bad or failure about yourself  0 1 0  Trouble concentrating 1 0 0  Moving slowly or fidgety/restless - 0 0  Suicidal thoughts 0 0 0  PHQ-9 Score '3 4 3  ' Difficult doing work/chores Somewhat difficult Somewhat difficult Not difficult at all  Some  recent data might be hidden   chantix - wants to get - but says insurance won't pay for it and she was able to get before?    Patient Active Problem List   Diagnosis Date Noted  . Tobacco use disorder 06/15/2019  . Osteopenia 03/03/2019  . Lower extremity edema 07/31/2018  . Pressure injury of skin 07/24/2018  . Surgical site infection 07/23/2018  . Wound of left leg 07/23/2018  . Atherosclerosis of artery of extremity with rest pain ([place]) 07/08/2018  . Shortness of breath 06/02/2018  . Bruit 06/02/2018  . Coronary artery disease of native artery of native heart with stable angina pectoris ([place]) 06/02/2018  . Atherosclerosis of native arteries of extremity with intermittent claudication ([place]) 05/19/2018  . Peripheral arterial disease ([place]) 05/06/2018  . Personal history of colonic polyps   . Aortic stenosis 12/24/2017  . LVH (left ventricular hypertrophy) 12/24/2017  . Hypertension with heart disease 12/24/2017  . Left atrial dilatation 12/12/2017  . Cardiac murmur 12/12/2017  . Gastrojejunal ulcer 12/04/2017  . Hyperphosphatemia 12/04/2017  . Spinal stenosis of lumbar region 06/18/2017  . Degeneration of lumbar intervertebral disc 06/02/2017  . Assistance needed with transportation 05/26/2017  . Financial difficulties 05/26/2017  . Needs assistance with community resources 05/26/2017  . Moderate recurrent major depression ([place]) 05/26/2017  . Non-healing wound of lower extremity 02/06/2017  .  Status post ankle arthrodesis 12/11/2016  . Controlled substance agreement broken 10/11/2016  . Low back pain 09/25/2016  . Osteoarthritis of right subtalar joint 07/11/2016  . Posterior tibial tendinitis of right lower extremity 07/11/2016  . Breast cancer screening 06/18/2016  . Knee pain 04/03/2016  . Hyponatremia 03/01/2016  . High triglycerides 12/24/2015  . Pes planus of both feet 11/16/2015  . Plantar fasciitis of right foot 11/16/2015  . Heel spur 11/16/2015  . Diabetic  neuropathy ([place]) 11/16/2015  . Right ankle pain 11/16/2015  . Medication monitoring encounter 11/16/2015  . Chronic pain of multiple joints 08/15/2015  . Abnormal mammogram of right breast 06/19/2015  . Cerumen impaction 03/16/2015  . Hyperlipidemia 12/14/2014  . History of transfusion of packed RBC 12/14/2014  . Hx of smoking 12/14/2014  . Status post bariatric surgery 12/14/2014  . Morbid obesity ([place]) 12/14/2014  . Chronic radicular lumbar pain 12/14/2014  . History of GI bleed 11/12/2014  . History of small bowel obstruction 09/07/2014    Past Surgical History:  Procedure Laterality Date  . ABDOMINAL HYSTERECTOMY    . APPLICATION OF WOUND VAC Left 07/23/2018   Procedure: APPLICATION OF WOUND VAC;  Surgeon: Algernon Huxley, MD;  Location: [facility] ORS;  Service: Vascular;  Laterality: Left;  . banck injections    . CATARACT EXTRACTION W/PHACO Right 05/30/2015   Procedure: CATARACT EXTRACTION PHACO AND INTRAOCULAR LENS PLACEMENT (IOC);  Surgeon: Birder Robson, MD;  Location: [facility] ORS;  Service: Ophthalmology;  Laterality: Right;  [place] 00:57 AP% 20.9 CDE 11.99 fluid pack lot #[id number]  . CATARACT EXTRACTION W/PHACO Left 11/11/2019   Procedure: CATARACT EXTRACTION PHACO AND INTRAOCULAR LENS PLACEMENT ([place]) LEFT DIABETIC;  Surgeon: Birder Robson, MD;  Location: [facility] ORS;  Service: Ophthalmology;  Laterality: Left;  Lot #[id number] H [place]: 00:47.5 CDE: 6.24  . CHOLECYSTECTOMY  1970  . COLON SURGERY  2013   blocked colon  . COLONOSCOPY WITH PROPOFOL N/A 01/20/2018   Procedure: COLONOSCOPY WITH PROPOFOL;  Surgeon: Lucilla Lame, MD;  Location: [facility] ENDOSCOPY;  Service: Endoscopy;  Laterality: N/A;  . ENDARTERECTOMY FEMORAL Left 07/08/2018   Procedure: ENDARTERECTOMY FEMORAL;  Surgeon: Algernon Huxley, MD;  Location: [facility] ORS;  Service: Vascular;  Laterality: Left;  . EYE SURGERY Right    cataract surgery  . FOOT FUSION Right 2018   metal in foot  . GALLBLADDER SURGERY  1970  . GASTRIC BYPASS   2010   lost 178 lbs and regained 40 lbs last few years  . HUMERUS FRACTURE SURGERY Right    metal plate with screws  . internal bleeding  2016   ulcer in past  . LOWER EXTREMITY ANGIOGRAM Left 07/08/2018   Procedure: LOWER EXTREMITY ANGIOGRAM;  Surgeon: Algernon Huxley, MD;  Location: [facility] ORS;  Service: Vascular;  Laterality: Left;  . LOWER EXTREMITY ANGIOGRAPHY Left 05/27/2018   Procedure: LOWER EXTREMITY ANGIOGRAPHY;  Surgeon: Algernon Huxley, MD;  Location: [place] CV LAB;  Service: Cardiovascular;  Laterality: Left;  . LOWER EXTREMITY ANGIOGRAPHY Right 12/07/2018   Procedure: LOWER EXTREMITY ANGIOGRAPHY;  Surgeon: Algernon Huxley, MD;  Location: [place] CV LAB;  Service: Cardiovascular;  Laterality: Right;  . LOWER EXTREMITY ANGIOGRAPHY Left 05/10/2019   Procedure: LOWER EXTREMITY ANGIOGRAPHY;  Surgeon: Algernon Huxley, MD;  Location: [place] CV LAB;  Service: Cardiovascular;  Laterality: Left;  . MOUTH SURGERY     root canals and crowns and extractions  . MOUTH SURGERY  09/30/2019  . OVARY SURGERY    .  SKIN GRAFT Right 2018   RT foot. foot has been rebuilt.  it is full of metal  . TENOTOMY ACHILLES TENDON Right    Percuntaneous. metal in foot  . TONSILLECTOMY    . WOUND DEBRIDEMENT Left 07/23/2018   Procedure: DEBRIDEMENT WOUND;  Surgeon: Algernon Huxley, MD;  Location: [facility] ORS;  Service: Vascular;  Laterality: Left;    Family History  Problem Relation Age of Onset  . Asthma Mother   . COPD Mother   . Arthritis Father   . Depression Father   . Heart disease Father   . Hypertension Father   . Stroke Father   . Heart attack Father   . Arthritis Brother   . Depression Brother   . Diabetes Brother   . Heart disease Brother   . Hyperlipidemia Brother   . Hypertension Brother   . Stroke Brother   . Vision loss Brother   . Heart attack Brother   . Diabetes Maternal Grandmother   . Thyroid disease Daughter   . Colitis Daughter   . Breast cancer Neg Hx     Social  History   Tobacco Use  . Smoking status: Current Some Day Smoker    Packs/day: 0.50    Years: 55.00    Pack years: 27.50    Types: Cigarettes    Start date: 07/01/1960    Last attempt to quit: 11/22/2018    Years since quitting: 1.0  . Smokeless tobacco: Never Used  . Tobacco comment: trying to quit again, using chantix  Substance Use Topics  . Alcohol use: Yes    Alcohol/week: 0.0 standard drinks    Comment: 2 drinks per month  . Drug use: Not Currently    Types: Marijuana    Comment: used for pain      Current Outpatient Medications:  .  Acetaminophen 500 MG capsule, Take 1,500 mg by mouth every 6 (six) hours as needed for pain. , Disp: , Rfl:  .  albuterol (VENTOLIN HFA) 108 (90 Base) MCG/ACT inhaler, Inhale 2 puffs into the lungs every 6 (six) hours as needed for wheezing or shortness of breath., Disp: 18 g, Rfl: 2 .  atorvastatin (LIPITOR) 10 MG tablet, Take 1 tablet (10 mg total) by mouth at bedtime., Disp: 90 tablet, Rfl: 3 .  Cholecalciferol (VITAMIN D3) 250 MCG (10000 UT) TABS, Take 10,000 Units by mouth daily., Disp: , Rfl:  .  clopidogrel (PLAVIX) 75 MG tablet, Take 1 tablet (75 mg total) by mouth daily., Disp: 90 tablet, Rfl: 3 .  fluticasone (FLONASE) 50 MCG/ACT nasal spray, Place 2 sprays into both nostrils daily as needed. (Patient taking differently: Place 2 sprays into both nostrils daily as needed for allergies. ), Disp: 16 g, Rfl: 4 .  gabapentin (NEURONTIN) 400 MG capsule, Take 1 capsule (400 mg total) by mouth 2 (two) times daily., Disp: 180 capsule, Rfl: 1 .  ipratropium-albuterol (DUONEB) 0.5-2.5 (3) MG/3ML SOLN, Take 3 mLs by nebulization 3 (three) times daily as needed. (Patient taking differently: Take 3 mLs by nebulization 3 (three) times daily as needed (Shortness of breath). ), Disp: 180 mL, Rfl: 1 .  Krill Oil 1000 MG CAPS, Take by mouth., Disp: , Rfl:  .  lisinopril (ZESTRIL) 10 MG tablet, Take 1 tablet (10 mg total) by mouth daily., Disp: 90 tablet, Rfl:  3 .  Multiple Vitamins-Minerals (MULTIVITAMIN ADULTS 50+) TABS, Take 1 tablet by mouth daily., Disp: 90 tablet, Rfl: 1 .  pantoprazole (PROTONIX) 20 MG tablet,  Take 1 tablet (20 mg total) by mouth daily., Disp: 90 tablet, Rfl: 1 .  sertraline (ZOLOFT) 100 MG tablet, TAKE 1 AND 1/2 TABLETS (150 MG TOTAL) BY MOUTH DAILY (Patient taking differently: Take 100 mg by mouth daily. ), Disp: 135 tablet, Rfl: 3 .  vitamin C (ASCORBIC ACID) 500 MG tablet, Take 1,000 mg by mouth daily. , Disp: , Rfl:  .  montelukast (SINGULAIR) 10 MG tablet, Take 1 tablet (10 mg total) by mouth at bedtime. (Patient not taking: Reported on 11/11/2019), Disp: 90 tablet, Rfl: 3 .  Respiratory Therapy Supplies (NEBULIZER/TUBING/MOUTHPIECE) KIT, Disp one nebulizer machine, tubing set and mouthpiece kit, Disp: 1 kit, Rfl: 0 .  umeclidinium-vilanterol (ANORO ELLIPTA) 62.5-25 MCG/INH AEPB, Inhale 1 puff into the lungs daily. (Patient not taking: Reported on 11/30/2019), Disp: 60 each, Rfl: 11  Allergies  Allergen Reactions  . Meloxicam Other (See Comments)    GI bleeding  . Nsaids Other (See Comments)    Gi bleeding  . Sitagliptin Hives and Itching    Januvia     Chart Review Today: I personally reviewed active problem list, medication list, allergies, family history, social history, health maintenance, notes from last encounter, lab results, imaging with the patient/caregiver today.   Review of Systems  10 Systems reviewed and are negative for acute change except as noted in the HPI.  Objective:    Vitals:   11/30/19 1139  BP: 132/68  Pulse: 71  Resp: 14  Temp: 97.9 F (36.6 C)  SpO2: 98%  Weight: 189 lb 8 oz (86 kg)  Height: '5\' 1"'  (1.549 m)    Body mass index is 35.81 kg/m.  Physical Exam Vitals and nursing note reviewed.  Constitutional:      General: She is not in acute distress.    Appearance: Normal appearance. She is well-developed. She is not ill-appearing, toxic-appearing or diaphoretic.      Interventions: Face mask in place.  HENT:     Head: Normocephalic and atraumatic.     Right Ear: External ear normal.     Left Ear: External ear normal.  Eyes:     General: Lids are normal. No scleral icterus.       Right eye: No discharge.        Left eye: No discharge.     Conjunctiva/sclera: Conjunctivae normal.  Neck:     Trachea: Phonation normal. No tracheal deviation.  Cardiovascular:     Rate and Rhythm: Normal rate and regular rhythm.     Pulses: Normal pulses.          Radial pulses are 2+ on the right side and 2+ on the left side.       Posterior tibial pulses are 2+ on the right side and 2+ on the left side.     Heart sounds: Normal heart sounds. No murmur heard.  No friction rub. No gallop.   Pulmonary:     Effort: Pulmonary effort is normal. No respiratory distress.     Breath sounds: Normal breath sounds. No stridor. No wheezing, rhonchi or rales.  Chest:     Chest wall: No tenderness.  Abdominal:     General: Bowel sounds are normal. There is no distension.     Palpations: Abdomen is soft.     Tenderness: There is no abdominal tenderness. There is no guarding or rebound.  Musculoskeletal:     Cervical back: Normal range of motion and neck supple.     Right lower leg: No edema.  Left lower leg: No edema.  Lymphadenopathy:     Cervical: No cervical adenopathy.  Skin:    General: Skin is warm and dry.     Coloration: Skin is not jaundiced or pale.     Findings: No rash.  Neurological:     Mental Status: She is alert and oriented to person, place, and time.     Motor: No abnormal muscle tone.     Gait: Gait normal.  Psychiatric:        Mood and Affect: Mood normal.        Speech: Speech normal.        Behavior: Behavior normal.       PHQ2/9: Depression screen [facility] 2/9 11/30/2019 08/24/2019 05/24/2019 03/11/2019 01/05/2019  Decreased Interest 0 0 1 3 0  Down, Depressed, Hopeless 1 1 - 3 1  PHQ - 2 Score '1 1 1 6 1  ' Altered sleeping 1 0 1 0 0  Tired,  decreased energy 0 1 1 0 0  Change in appetite 0 1 0 0 0  Feeling bad or failure about yourself  0 1 0 0 1  Trouble concentrating 1 0 0 0 1  Moving slowly or fidgety/restless - 0 0 0 1  Suicidal thoughts 0 0 0 0 0  PHQ-9 Score '3 4 3 6 4  ' Difficult doing work/chores Somewhat difficult Somewhat difficult Not difficult at all Not difficult at all Not difficult at all  Some recent data might be hidden    phq 9 is neg, reviewed  Fall Risk: Fall Risk  11/30/2019 08/24/2019 05/24/2019 03/11/2019 01/05/2019  Falls in the past year? 1 0 0 1 1  Comment - - - - -  Number falls in past yr: 1 0 0 1 1  Injury with Fall? 1 0 0 0 0  Comment - - - - -  Risk for fall due to : History of fall(s);Impaired balance/gait;Impaired mobility - - - -  Risk for fall due to: Comment - - - - -  Follow up - - - - Falls prevention discussed    Functional Status Survey: Is the patient deaf or have difficulty hearing?: No Does the patient have difficulty seeing, even when wearing glasses/contacts?: Yes Does the patient have difficulty concentrating, remembering, or making decisions?: No Does the patient have difficulty walking or climbing stairs?: No Does the patient have difficulty dressing or bathing?: No Does the patient have difficulty doing errands alone such as visiting a doctor's office or shopping?: No   Assessment & Plan:     ICD-10-CM   1. Mixed hyperlipidemia  D39.1 COMPLETE METABOLIC PANEL WITH GFR    Lipid panel   Compliant with statin medication, no myalgias, side effects or concerns, recheck labs today  2. Peripheral arterial disease (HCC)  S25.8 COMPLETE METABOLIC PANEL WITH GFR    Lipid panel   see vascular specialists, no sx of worsening PAD or ischemia, on plavix -stent to left leg she has done physical therapy and her strength has improved.  3. Diabetic polyneuropathy associated with type 2 diabetes mellitus ([place])  E11.42 Ambulatory referral to [place] GFR     Lipid panel    Hemoglobin A1c   Managed with diet lifestyle, recheck labs today, no signs or symptoms concerning for hypoglycemia or worsening DM  4. Chronic obstructive pulmonary disease, unspecified COPD type ([place])  J44.9    pt notes copd is well controlled, she stopped maintenence inhaler, uses  SABA or duonebs prn  5. Tobacco use disorder  F17.200    Patient has desire to quit smoking we discussed smoking cessation today.  She would like to get Chantix again, cost issues with new insurance Working with CCM team, referred to C3 for assistance with getting chantix   6. Fungal nail infection  B35.1 Ambulatory referral to Podiatry   Patient requests referral to podiatry  7. Ingrowing nail, right great toe  L60.0 Ambulatory referral to Podiatry   Patient requests referral to podiatry  8. Morbid obesity ([place]) Chronic E66.01    With multiple comorbidities, CAD, PAD, hyperlipidemia, hypertension, DM COPD  9. Coronary artery disease of native artery of native heart with stable angina pectoris [facility]) Chronic  Per cardiology - continue HTN/cardiac meds, CAD on CT, neg stress test - Dr. Rockey Situ No angina   I25.118   10. Urinary incontinence, unspecified type  R32 Urinalysis, Routine w reflex microscopic    MICROSCOPIC MESSAGE   mentioned at the end of visit and wants advice on supplements found in an add -patient was told to please get a follow-up appointment   11. Medication monitoring encounter  Z51.81 CBC with Differential/Platelet    COMPLETE METABOLIC PANEL WITH GFR    Lipid panel    Hemoglobin A1c   Encouraged to review urinary frequency incontinence a handout, and come back for a office visit for acute concerns and complaints  Return for f/up 6 months for routine conditions, come back sooner with sx/concerns.   [name], PA-C 11/30/19 12:07 PM

## 2019-12-01 LAB — COMPLETE METABOLIC PANEL WITH GFR
AG Ratio: 1.7 (calc) (ref 1.0–2.5)
ALT: 10 U/L (ref 6–29)
AST: 15 U/L (ref 10–35)
Albumin: 4.3 g/dL (ref 3.6–5.1)
Alkaline phosphatase (APISO): 81 U/L (ref 37–153)
BUN: 23 mg/dL (ref 7–25)
CO2: 29 mmol/L (ref 20–32)
Calcium: 9.8 mg/dL (ref 8.6–10.4)
Chloride: 101 mmol/L (ref 98–110)
Creat: 0.81 mg/dL (ref 0.60–0.93)
GFR, Est African American: 84 mL/min/{1.73_m2} (ref >=60)
GFR, Est Non African American: 73 mL/min/{1.73_m2} (ref >=60)
Globulin: 2.5 g/dL (calc) (ref 1.9–3.7)
Glucose, Bld: 124 mg/dL — ABNORMAL HIGH (ref 65–99)
Potassium: 5.1 mmol/L (ref 3.5–5.3)
Sodium: 137 mmol/L (ref 135–146)
Total Bilirubin: 0.4 mg/dL (ref 0.2–1.2)
Total Protein: 6.8 g/dL (ref 6.1–8.1)

## 2019-12-01 LAB — CBC WITH DIFFERENTIAL/PLATELET
Absolute Monocytes: 680 cells/uL (ref 200–950)
Basophils Absolute: 77 cells/uL (ref 0–200)
Basophils Relative: 0.9 %
Eosinophils Absolute: 281 cells/uL (ref 15–500)
Eosinophils Relative: 3.3 %
HCT: 37.6 % (ref 35.0–45.0)
Hemoglobin: 12.3 g/dL (ref 11.7–15.5)
Lymphs Abs: 1488 cells/uL (ref 850–3900)
MCH: 28.3 pg (ref 27.0–33.0)
MCHC: 32.7 g/dL (ref 32.0–36.0)
MCV: 86.6 fL (ref 80.0–100.0)
MPV: 9.3 fL (ref 7.5–12.5)
Monocytes Relative: 8 %
Neutro Abs: 5976 cells/uL (ref 1500–7800)
Neutrophils Relative %: 70.3 %
Platelets: 208 10*3/uL (ref 140–400)
RBC: 4.34 10*6/uL (ref 3.80–5.10)
RDW: 12.7 % (ref 11.0–15.0)
Total Lymphocyte: 17.5 %
WBC: 8.5 10*3/uL (ref 3.8–10.8)

## 2019-12-01 LAB — LIPID PANEL
Cholesterol: 134 mg/dL (ref <200)
HDL: 46 mg/dL — ABNORMAL LOW (ref >=50)
LDL Cholesterol (Calc): 68 mg/dL (calc)
Non-HDL Cholesterol (Calc): 88 mg/dL (calc) (ref <130)
Total CHOL/HDL Ratio: 2.9 (calc) (ref <5.0)
Triglycerides: 112 mg/dL (ref <150)

## 2019-12-01 LAB — URINALYSIS, ROUTINE W REFLEX MICROSCOPIC
Bacteria, UA: NONE SEEN /HPF
Bilirubin Urine: NEGATIVE
Glucose, UA: NEGATIVE
Hgb urine dipstick: NEGATIVE
Hyaline Cast: NONE SEEN /LPF
Ketones, ur: NEGATIVE
Nitrite: NEGATIVE
Protein, ur: NEGATIVE
RBC / HPF: NONE SEEN /HPF (ref 0–2)
Specific Gravity, Urine: 1.018 (ref 1.001–1.03)
pH: <=5 (ref 5.0–8.0)

## 2019-12-01 LAB — HEMOGLOBIN A1C
Hgb A1c MFr Bld: 5.9 % of total Hgb — ABNORMAL HIGH (ref <5.7)
Mean Plasma Glucose: 123 (calc)
eAG (mmol/L): 6.8 (calc)

## 2019-12-02 ENCOUNTER — Telehealth: Payer: Self-pay | Admitting: Family Medicine

## 2019-12-02 NOTE — Chronic Care Management (AMB) (Signed)
  Chronic Care Management   Note  12/02/2019 Name: Andrea Holden MRN: [id number] DOB: Nov 29, 1945  Andrea Holden is a 74 y.o. year old female who is a primary care patient of Sander Radon and is actively engaged with the care management team. I reached out to [employer] by phone today to assist with scheduling an initial visit with the Licensed Clinical Child psychotherapist.  Follow up plan: Spoke with Crystal and she will outreach to patient 12/03/2019.If patient returns call to provider office, please advise to call Embedded Care Management Care Guide Gwenevere Ghazi at [phone].  Gwenevere Ghazi  Care Guide, Embedded Care Coordination [facility]  [place], [place] [address] Direct Dial: [phone] [email or web address]: [email or web address]

## 2019-12-03 ENCOUNTER — Ambulatory Visit (INDEPENDENT_AMBULATORY_CARE_PROVIDER_SITE_OTHER): Payer: Medicare (Managed Care) | Admitting: *Deleted

## 2019-12-03 DIAGNOSIS — Z599 Problem related to housing and economic circumstances, unspecified: Secondary | ICD-10-CM

## 2019-12-03 DIAGNOSIS — F3341 Major depressive disorder, recurrent, in partial remission: Secondary | ICD-10-CM

## 2019-12-03 DIAGNOSIS — Z789 Other specified health status: Secondary | ICD-10-CM

## 2019-12-03 NOTE — Chronic Care Management (AMB) (Signed)
Chronic Care Management    Clinical Social Work Follow Up Note  12/03/2019 Name: Andrea Holden MRN: [id number] DOB: 1945-09-12  Andrea Holden is a 74 y.o. year old female who is a primary care patient of [name], [place]. The CCM team was consulted for assistance with [name] .   Review of patient status, including review of consultants reports, other relevant assessments, and collaboration with appropriate care team members and the patient's provider was performed as part of comprehensive patient evaluation and provision of chronic care management services.    SDOH (Social Determinants of Health) assessments performed: No    Outpatient Encounter Medications as of 12/03/2019  Medication Sig  . Acetaminophen 500 MG capsule Take 1,500 mg by mouth every 6 (six) hours as needed for pain.   Marland Kitchen albuterol (VENTOLIN HFA) 108 (90 Base) MCG/ACT inhaler Inhale 2 puffs into the lungs every 6 (six) hours as needed for wheezing or shortness of breath.  Marland Kitchen atorvastatin (LIPITOR) 10 MG tablet Take 1 tablet (10 mg total) by mouth at bedtime.  . Cholecalciferol (VITAMIN D3) 250 MCG (10000 UT) TABS Take 10,000 Units by mouth daily.  . clopidogrel (PLAVIX) 75 MG tablet Take 1 tablet (75 mg total) by mouth daily.  . fluticasone (FLONASE) 50 MCG/ACT nasal spray Place 2 sprays into both nostrils daily as needed. (Patient taking differently: Place 2 sprays into both nostrils daily as needed for allergies. )  . gabapentin (NEURONTIN) 400 MG capsule Take 1 capsule (400 mg total) by mouth 2 (two) times daily.  Marland Kitchen ipratropium-albuterol (DUONEB) 0.5-2.5 (3) MG/3ML SOLN Take 3 mLs by nebulization 3 (three) times daily as needed. (Patient taking differently: Take 3 mLs by nebulization 3 (three) times daily as needed (Shortness of breath). )  . Krill Oil 1000 MG CAPS Take by mouth.  Marland Kitchen lisinopril (ZESTRIL) 10 MG tablet Take 1 tablet (10 mg total) by mouth daily.  . montelukast (SINGULAIR) 10 MG tablet Take  1 tablet (10 mg total) by mouth at bedtime. (Patient not taking: Reported on 11/11/2019)  . Multiple Vitamins-Minerals (MULTIVITAMIN ADULTS 50+) TABS Take 1 tablet by mouth daily.  . pantoprazole (PROTONIX) 20 MG tablet Take 1 tablet (20 mg total) by mouth daily.  Marland Kitchen Respiratory Therapy Supplies (NEBULIZER/TUBING/MOUTHPIECE) KIT Disp one nebulizer machine, tubing set and mouthpiece kit  . sertraline (ZOLOFT) 100 MG tablet TAKE 1 AND 1/2 TABLETS (150 MG TOTAL) BY MOUTH DAILY (Patient taking differently: Take 100 mg by mouth daily. )  . umeclidinium-vilanterol (ANORO ELLIPTA) 62.5-25 MCG/INH AEPB Inhale 1 puff into the lungs daily. (Patient not taking: Reported on 11/30/2019)  . vitamin C (ASCORBIC ACID) 500 MG tablet Take 1,000 mg by mouth daily.    No facility-administered encounter medications on file as of 12/03/2019.     Goals Addressed            This Visit's Progress   . "I need help finding a new place to live": (pt-stated)       [place] (see longitudinal plan of care for additional care plan information)  Current Barriers:  . Housing barriers  Clinical Social Work Clinical Goal(s):  Marland Kitchen Over the next 90 days, patient will work with the [facility] to address needs related to housing needs  Interventions: . Inter-disciplinary care team collaboration (see longitudinal plan of care) . Patient interviewed and appropriate assessments performed . Patient discussed increased stress related to being given a 60 day notice to move from her apartment  .  Patient also requesting assistance with obtaining Chantix . Patient allowed to vent her frustrations,emotional support provided . Provided patient with information about the [facility] through [place] to request assistance with moving expenses . Confirmed that patient has been referred to the C3 team for assistance with obtaining her Chantix . Discussed plans with patient for ongoing care management follow up and provided  patient with direct contact information for care management team  Patient Self Care Activities:  . Performs ADL's independently . Calls provider office for new concerns or questions . Unable to perform IADLs independently  Initial goal documentation         Follow Up Plan: SW will follow up with patient by phone over the next 7-10 buisness days   Elliot Gurney, [place] Worker  [facility] Care Management [phone]

## 2019-12-03 NOTE — Patient Instructions (Signed)
Thank you allowing the Chronic Care Management Team to be a part of your care! It was a pleasure speaking with you today!  1. Please call the [facility] through the [address] to request assistance with upcoming move  2.  CCM (Chronic Care Management) Team   [name] RN, BSN Nurse Care Coordinator  [phone]  [place], LCSW Clinical Social Worker [phone]  Goals Addressed            This Visit's Progress   . "I need help finding a new place to live": (pt-stated)       CARE PLAN ENTRY (see longitudinal plan of care for additional care plan information)  Current Barriers:  . Housing barriers  Clinical Social Work Clinical Goal(s):  Marland Kitchen Over the next 90 days, patient will work with the [facility] to address needs related to housing needs  Interventions: . Inter-disciplinary care team collaboration (see longitudinal plan of care) . Patient interviewed and appropriate assessments performed . Patient discussed increased stress related to being given a 60 day notice to move from her apartment  . Patient also requesting assistance with obtaining Chantix . Patient allowed to vent her frustrations,emotional support provided . Provided patient with information about the [facility] through [address] to request assistance with moving expenses . Confirmed that patient has been referred to the C3 team for assistance with obtaining her Chantix . Discussed plans with patient for ongoing care management follow up and provided patient with direct contact information for care management team  Patient Self Care Activities:  . Performs ADL's independently . Calls provider office for new concerns or questions . Unable to perform IADLs independently  Initial goal documentation         The patient verbalized understanding of instructions provided today and declined a print copy of patient instruction materials.   The care management team will reach out to  the patient again over the next 7-10 business days.

## 2019-12-06 ENCOUNTER — Ambulatory Visit: Payer: Self-pay | Admitting: *Deleted

## 2019-12-06 ENCOUNTER — Telehealth: Payer: Self-pay

## 2019-12-06 DIAGNOSIS — Z789 Other specified health status: Secondary | ICD-10-CM

## 2019-12-06 DIAGNOSIS — F3341 Major depressive disorder, recurrent, in partial remission: Secondary | ICD-10-CM

## 2019-12-06 DIAGNOSIS — Z599 Problem related to housing and economic circumstances, unspecified: Secondary | ICD-10-CM

## 2019-12-06 NOTE — Chronic Care Management (AMB) (Signed)
Chronic Care Management    Clinical Social Work Follow Up Note  12/06/2019 Name: Andrea Holden MRN: [id number] DOB: May 10, 1946  Andrea Holden is a 74 y.o. year old female who is a primary care patient of [name], [place]. The CCM team was consulted for assistance with [name] .   Review of patient status, including review of consultants reports, other relevant assessments, and collaboration with appropriate care team members and the patient's provider was performed as part of comprehensive patient evaluation and provision of chronic care management services.    SDOH (Social Determinants of Health) assessments performed: No    Outpatient Encounter Medications as of 12/06/2019  Medication Sig  . Acetaminophen 500 MG capsule Take 1,500 mg by mouth every 6 (six) hours as needed for pain.   Marland Kitchen albuterol (VENTOLIN HFA) 108 (90 Base) MCG/ACT inhaler Inhale 2 puffs into the lungs every 6 (six) hours as needed for wheezing or shortness of breath.  Marland Kitchen atorvastatin (LIPITOR) 10 MG tablet Take 1 tablet (10 mg total) by mouth at bedtime.  . Cholecalciferol (VITAMIN D3) 250 MCG (10000 UT) TABS Take 10,000 Units by mouth daily.  . clopidogrel (PLAVIX) 75 MG tablet Take 1 tablet (75 mg total) by mouth daily.  . fluticasone (FLONASE) 50 MCG/ACT nasal spray Place 2 sprays into both nostrils daily as needed. (Patient taking differently: Place 2 sprays into both nostrils daily as needed for allergies. )  . gabapentin (NEURONTIN) 400 MG capsule Take 1 capsule (400 mg total) by mouth 2 (two) times daily.  Marland Kitchen ipratropium-albuterol (DUONEB) 0.5-2.5 (3) MG/3ML SOLN Take 3 mLs by nebulization 3 (three) times daily as needed. (Patient taking differently: Take 3 mLs by nebulization 3 (three) times daily as needed (Shortness of breath). )  . Krill Oil 1000 MG CAPS Take by mouth.  Marland Kitchen lisinopril (ZESTRIL) 10 MG tablet Take 1 tablet (10 mg total) by mouth daily.  . montelukast (SINGULAIR) 10 MG tablet Take  1 tablet (10 mg total) by mouth at bedtime. (Patient not taking: Reported on 11/11/2019)  . Multiple Vitamins-Minerals (MULTIVITAMIN ADULTS 50+) TABS Take 1 tablet by mouth daily.  . pantoprazole (PROTONIX) 20 MG tablet Take 1 tablet (20 mg total) by mouth daily.  Marland Kitchen Respiratory Therapy Supplies (NEBULIZER/TUBING/MOUTHPIECE) KIT Disp one nebulizer machine, tubing set and mouthpiece kit  . sertraline (ZOLOFT) 100 MG tablet TAKE 1 AND 1/2 TABLETS (150 MG TOTAL) BY MOUTH DAILY (Patient taking differently: Take 100 mg by mouth daily. )  . umeclidinium-vilanterol (ANORO ELLIPTA) 62.5-25 MCG/INH AEPB Inhale 1 puff into the lungs daily. (Patient not taking: Reported on 11/30/2019)  . vitamin C (ASCORBIC ACID) 500 MG tablet Take 1,000 mg by mouth daily.    No facility-administered encounter medications on file as of 12/06/2019.     Goals Addressed            This Visit's Progress   . "I need help finding a new place to live": (pt-stated)       [place] (see longitudinal plan of care for additional care plan information)  Current Barriers:  . Housing barriers  Clinical Social Work Clinical Goal(s):  Marland Kitchen Over the next 90 days, patient will work with the [facility] to address needs related to housing needs  Interventions: . Inter-disciplinary care team collaboration (see longitudinal plan of care) . Patient interviewed and appropriate assessments performed . Patient discussed increased stress related to being given a 60 day notice to move from her apartment  .  Patient now stating that the 60 days will start in the fall . Patient confirmed that she has spoke with the careguide regarding assistance with obtaining Chantix . Patient allowed to vent her frustrations,emotional support provided . Suggested prioritizing tasks to avoid getting overwhelmed . Patient confirmed that she has contacted the [facility] through [place] to request assistance with moving expenses and has left a  message . Discussed plans with patient for ongoing care management follow up and provided patient with direct contact information for care management team  Patient Self Care Activities:  . Performs ADL's independently . Calls provider office for new concerns or questions . Unable to perform IADLs independently  Please see past updates related to this goal by clicking on the "Past Updates" button in the selected goal          Follow Up Plan: SW will follow up with patient by phone over the next 7-14 business days   [place], [place] Worker  [facility] Care Management [phone]

## 2019-12-06 NOTE — Telephone Encounter (Signed)
Copied from CRM [id number]. Topic: Referral - Status >> Dec 06, 2019  2:28 PM [name] wrote: 12/06/19 Spoke with patient about  Smoking Cessation class registered patient for upcoming July class.  A confirmation will be mailed/emailed to the patient with class details.  Also emailed information to Reather Littler to mail to patient for [employer], [employer], housing resources for [employer] and [employer]. Olean Ree [phone].

## 2019-12-06 NOTE — Patient Instructions (Addendum)
Thank you allowing the Chronic Care Management Team to be a part of your care! It was a pleasure speaking with you today!  1. Please follow up with the [facility] through the [address]  CCM (Chronic Care Management) Team   [name] RN, BSN Nurse Care Coordinator  [phone]  Andrea Holden [address], LCSW Clinical Social Worker [phone]  Goals Addressed            This Visit's Progress   . "I need help finding a new place to live": (pt-stated)       CARE PLAN ENTRY (see longitudinal plan of care for additional care plan information)  Current Barriers:  . Housing barriers  Clinical Social Work Clinical Goal(s):  Marland Kitchen Over the next 90 days, patient will work with the [facility] to address needs related to housing needs  Interventions: . Inter-disciplinary care team collaboration (see longitudinal plan of care) . Patient interviewed and appropriate assessments performed . Patient discussed increased stress related to being given a 60 day notice to move from her apartment  . Patient now stating that the 60 days will start in the fall . Patient confirmed that she has spoke with the careguide regarding assistance with obtaining Chantix . Patient allowed to vent her frustrations,emotional support provided . Patient confirmed that she has contacted the [facility] through [address] to request assistance with moving expenses and has left a message . Discussed plans with patient for ongoing care management follow up and provided patient with direct contact information for care management team  Patient Self Care Activities:  . Performs ADL's independently . Calls provider office for new concerns or questions . Unable to perform IADLs independently  Please see past updates related to this goal by clicking on the "Past Updates" button in the selected goal          The patient verbalized understanding of instructions provided today and declined a print copy of  patient instruction materials.   Telephone follow up appointment with care management team member scheduled for:  12/20/19

## 2019-12-13 ENCOUNTER — Telehealth: Payer: Self-pay

## 2019-12-13 NOTE — Telephone Encounter (Signed)
Copied from CRM [id number]. Topic: Referral - Status >> Dec 13, 2019  4:18 PM [name] wrote: 12/13/19 Spoke with patient, she received the resource letter with housing resources, [employer] and financial resources.   Will follow-up with patient 12/16/19. Olean Ree [phone]

## 2019-12-15 ENCOUNTER — Telehealth: Payer: Self-pay

## 2019-12-15 NOTE — Telephone Encounter (Signed)
Copied from CRM [id number]. Topic: Referral - Status >> Dec 15, 2019  3:23 PM [name] wrote: 12/15/19 Spoke with patient she asked that I call to follow-up with her Friday 6/18.  Andrea Holden [phone]

## 2019-12-16 ENCOUNTER — Ambulatory Visit: Payer: Self-pay | Admitting: *Deleted

## 2019-12-16 DIAGNOSIS — F3341 Major depressive disorder, recurrent, in partial remission: Secondary | ICD-10-CM

## 2019-12-16 DIAGNOSIS — Z789 Other specified health status: Secondary | ICD-10-CM

## 2019-12-16 DIAGNOSIS — Z599 Problem related to housing and economic circumstances, unspecified: Secondary | ICD-10-CM

## 2019-12-16 NOTE — Chronic Care Management (AMB) (Signed)
Chronic Care Management    Clinical Social Work Follow Up Note  12/16/2019 Name: Andrea Holden MRN: [id number] DOB: [birth year]  Andrea Holden is a 74 y.o. year old female who is a primary care patient of [name], [place]. The CCM team was consulted for assistance with [name] .   Review of patient status, including review of consultants reports, other relevant assessments, and collaboration with appropriate care team members and the patient's provider was performed as part of comprehensive patient evaluation and provision of chronic care management services.    SDOH (Social Determinants of Health) assessments performed: No    Outpatient Encounter Medications as of 12/16/2019  Medication Sig  . Acetaminophen 500 MG capsule Take 1,500 mg by mouth every 6 (six) hours as needed for pain.   Marland Kitchen albuterol (VENTOLIN HFA) 108 (90 Base) MCG/ACT inhaler Inhale 2 puffs into the lungs every 6 (six) hours as needed for wheezing or shortness of breath.  Marland Kitchen atorvastatin (LIPITOR) 10 MG tablet Take 1 tablet (10 mg total) by mouth at bedtime.  . Cholecalciferol (VITAMIN D3) 250 MCG (10000 UT) TABS Take 10,000 Units by mouth daily.  . clopidogrel (PLAVIX) 75 MG tablet Take 1 tablet (75 mg total) by mouth daily.  . fluticasone (FLONASE) 50 MCG/ACT nasal spray Place 2 sprays into both nostrils daily as needed. (Patient taking differently: Place 2 sprays into both nostrils daily as needed for allergies. )  . gabapentin (NEURONTIN) 400 MG capsule Take 1 capsule (400 mg total) by mouth 2 (two) times daily.  Marland Kitchen ipratropium-albuterol (DUONEB) 0.5-2.5 (3) MG/3ML SOLN Take 3 mLs by nebulization 3 (three) times daily as needed. (Patient taking differently: Take 3 mLs by nebulization 3 (three) times daily as needed (Shortness of breath). )  . Krill Oil 1000 MG CAPS Take by mouth.  Marland Kitchen lisinopril (ZESTRIL) 10 MG tablet Take 1 tablet (10 mg total) by mouth daily.  . montelukast (SINGULAIR) 10 MG tablet  Take 1 tablet (10 mg total) by mouth at bedtime. (Patient not taking: Reported on 11/11/2019)  . Multiple Vitamins-Minerals (MULTIVITAMIN ADULTS 50+) TABS Take 1 tablet by mouth daily.  . pantoprazole (PROTONIX) 20 MG tablet Take 1 tablet (20 mg total) by mouth daily.  Marland Kitchen Respiratory Therapy Supplies (NEBULIZER/TUBING/MOUTHPIECE) KIT Disp one nebulizer machine, tubing set and mouthpiece kit  . sertraline (ZOLOFT) 100 MG tablet TAKE 1 AND 1/2 TABLETS (150 MG TOTAL) BY MOUTH DAILY (Patient taking differently: Take 100 mg by mouth daily. )  . umeclidinium-vilanterol (ANORO ELLIPTA) 62.5-25 MCG/INH AEPB Inhale 1 puff into the lungs daily. (Patient not taking: Reported on 11/30/2019)  . vitamin C (ASCORBIC ACID) 500 MG tablet Take 1,000 mg by mouth daily.    No facility-administered encounter medications on file as of 12/16/2019.     Goals Addressed              This Visit's Progress   .  "I need help finding a new place to live": (pt-stated)        [place] (see longitudinal plan of care for additional care plan information)  Current Barriers:  . Housing barriers  Clinical Social Work Clinical Goal(s):  Marland Kitchen Over the next 90 days, patient will work with the [facility] to address needs related to housing needs  Interventions: . Inter-disciplinary care team collaboration (see longitudinal plan of care) . Patient interviewed and appropriate assessments performed . Patient confirmed receiving housing resources from the Care Guide . Patient requesting assistance with obtaining  Chantix -  . Collaboration with the care guide-scheduler who will schedule patient with the pharmacist to discuss need for Chantix  . Care Guide confirmed signing patient up for the smoking cessation class through [facility], contact number provided the number to the program as requested [phone] . Discussed plans with patient for ongoing care management follow up and provided patient with direct contact  information for care management team  Patient Self Care Activities:  . Performs ADL's independently . Calls provider office for new concerns or questions . Unable to perform IADLs independently  Please see past updates related to this goal by clicking on the "Past Updates" button in the selected goal          Follow Up Plan: SW will follow up with patient by phone over the next 7-10 business days to assess for any additional social work needs   [employer], [facility] Care Management [phone]

## 2019-12-16 NOTE — Patient Instructions (Signed)
Thank you allowing the Chronic Care Management Team to be a part of your care! It was a pleasure speaking with you today!  1. Please anticipate a call from the pharmacist regarding the Chantix in the following weeks  CCM (Chronic Care Management) Team   Andrea Fairly RN, BSN Nurse Care Coordinator  [phone]   Andrea Holden [address], LCSW Clinical Social Worker [phone]  Goals Addressed              This Visit's Progress   .  "I need help finding a new place to live": (pt-stated)        CARE PLAN ENTRY (see longitudinal plan of care for additional care plan information)  Current Barriers:  . Housing barriers  Clinical Social Work Clinical Goal(s):  Marland Kitchen Over the next 90 days, patient will work with the [facility] to address needs related to housing needs  Interventions: . Inter-disciplinary care team collaboration (see longitudinal plan of care) . Patient interviewed and appropriate assessments performed . Patient confirmed receiving housing resources from the Care Guide . Patient requesting assistance with obtaining Chantix -  . Collaboration with the care guide-scheduler who will schedule patient with the pharmacist to discuss need for Chantix  . Care Guide confirmed signing patient up for the smoking cessation class through [facility], contact number provided the number to the program as requested [phone] . Discussed plans with patient for ongoing care management follow up and provided patient with direct contact information for care management team  Patient Self Care Activities:  . Performs ADL's independently . Calls provider office for new concerns or questions . Unable to perform IADLs independently  Please see past updates related to this goal by clicking on the "Past Updates" button in the selected goal          The patient verbalized understanding of instructions provided today and declined a print copy of patient instruction materials.    Telephone follow up appointment with care management team member scheduled for:  12/30/19

## 2019-12-17 ENCOUNTER — Telehealth: Payer: Self-pay | Admitting: Family Medicine

## 2019-12-17 ENCOUNTER — Telehealth: Payer: Self-pay

## 2019-12-17 NOTE — Telephone Encounter (Signed)
Copied from CRM [id number]. Topic: Referral - Status >> Dec 17, 2019 12:57 PM [name] wrote: 12/17/19 Spoke with patient reminded her of her smoking cessation classes scheduled from 01/10/20-01/31/20 and gave her the contact number for further assistance.  Patient stated that Catie Feliz Beam is scheduling an appt with the pharmacist for assistance with Chantix.  No further resources needed at this time. Closing referral. Ole[place] Ree [phone]

## 2019-12-17 NOTE — Chronic Care Management (AMB) (Signed)
  Chronic Care Management   Note  12/17/2019 Name: Andrea Holden MRN: [id number] DOB: 05/22/1946  [name] is a 74 y.o. year old female who is a primary care patient of Sander Radon and is actively engaged with the care management team. I reached out to [employer] by phone today to assist with scheduling an initial visit with the Pharmacist.  Follow up plan: Telephone appointment with care management team member scheduled for: 01/12/2020.  Gwenevere Ghazi  Care Guide, Embedded Care Coordination [facility]  [place], [place] [address] Direct Dial: [phone] [email or web address] Website: [email or web address]

## 2019-12-20 ENCOUNTER — Telehealth: Payer: Self-pay

## 2019-12-21 ENCOUNTER — Other Ambulatory Visit: Payer: Self-pay

## 2019-12-21 ENCOUNTER — Ambulatory Visit (INDEPENDENT_AMBULATORY_CARE_PROVIDER_SITE_OTHER): Payer: Medicare (Managed Care) | Admitting: Podiatry

## 2019-12-21 VITALS — Temp 96.7°F

## 2019-12-21 DIAGNOSIS — B351 Tinea unguium: Secondary | ICD-10-CM | POA: Diagnosis not present

## 2019-12-21 DIAGNOSIS — M79675 Pain in left toe(s): Secondary | ICD-10-CM

## 2019-12-21 DIAGNOSIS — M79674 Pain in right toe(s): Secondary | ICD-10-CM | POA: Diagnosis not present

## 2019-12-21 DIAGNOSIS — Z79899 Other long term (current) drug therapy: Secondary | ICD-10-CM

## 2019-12-21 MED ORDER — TERBINAFINE HCL 250 MG PO TABS: 250.0000 mg | ORAL_TABLET | Freq: Every day | ORAL | 0 refills | Status: DC

## 2019-12-21 NOTE — Addendum Note (Signed)
Addended by: Geraldine Contras D on: 12/21/2019 03:34 PM   Modules accepted: Orders

## 2019-12-21 NOTE — Progress Notes (Signed)
HPI: 74 y.o. female presenting today as a new patient for evaluation of thickened discolored nails to the bilateral great toes right worse than the left.  Patient states that is been like this for the past few years.  They have slowly gotten thicker and they are currently symptomatic with certain shoe gear.  She was referred here from her PCP for evaluation and further treatment.  Past Medical History:  Diagnosis Date  . Allergy    seasonal allergies  . Anemia 2014   needed 5 units of blood d/t passing out, weak  . Anxiety   . Aortic stenosis 12/24/2017   Echo Aug 2018  . Arthritis   . Asthma    allergy induced asthma  . Cardiac murmur 12/12/2017  . Cataract   . Cataract    left  . Complication of anesthesia    arrhythmia following colonoscopy  . Degenerative disc disease, lumbar   . Degenerative disc disease, lumbar   . Depression   . Diabetes mellitus   . Diabetic neuropathy (HCC) 11/16/2015  . Dysrhythmia    stenosis of left ventricle  . GERD (gastroesophageal reflux disease)   . H/O transfusion    patient was given 5 units of blood while at [facility], blood type O+  . History of chicken pox   . History of hiatal hernia   . History of measles, mumps, or rubella   . HOH (hard of hearing)    does not use hearing aides yet  . Hyperlipidemia   . Hypertension   . Irregular heartbeat   . LVH (left ventricular hypertrophy) 12/24/2017   Echo Aug 2018  . Neuropathy   . Opiate use 11/16/2015  . Peripheral arterial disease (HCC) 05/06/2018   At rest, left > right; refer to vasc  . Pes planus of both feet 11/16/2015  . Plantar fasciitis of right foot 11/16/2015  . Pneumonia   . Wheezing      Physical Exam: General: The patient is alert and oriented x3 in no acute distress.  Dermatology: Skin is warm, dry and supple bilateral lower extremities. Negative for open lesions or macerations.  Hyperkeratotic dystrophic nails noted bilateral great toes with discoloration consistent with  onychomycosis of toenail  Vascular: Palpable pedal pulses bilaterally. No edema or erythema noted. Capillary refill within normal limits.  Neurological: Epicritic and protective threshold grossly intact bilaterally.   Musculoskeletal Exam: Range of motion within normal limits to all pedal and ankle joints bilateral. Muscle strength 5/5 in all groups bilateral.   Assessment: 1.  Onychomycosis of toenails bilateral great toes   Plan of Care:  1. Patient evaluated.  2.  Discussed different treatment options including topical and oral antifungal medication.  Patient would like to do the oral antifungal medication Lamisil as I do believe this is the most effective treatment modality for her 3.  Prescription for Lamisil turned 50 mg #90 daily 4.  Order for hepatic function panel placed.  5.  Return to clinic as needed      Felecia Shelling, DPM [facility]  Dr. Felecia Shelling, DPM    [address], [place] [address]                Office (  [phone]  Fax [phone]

## 2019-12-23 ENCOUNTER — Telehealth: Payer: Self-pay | Admitting: Family Medicine

## 2019-12-23 NOTE — Telephone Encounter (Signed)
Patient is calling because she is under the care of Gala Lewandowsky MD Podiatry. Dr Logan Bores is wanting patient's Hepatic function panel to be tested. [facility]. Patient is calling  to request an [facility] for [facility] Hep[facility] Function Panel. Patient is under treatment for fungus on her feet.  [phone]  Patient is available tomorrow to come for blood work.  Patient is requesting a Call Back when the order is placed.

## 2019-12-23 NOTE — Telephone Encounter (Signed)
This needs to be ordered by him at his office?

## 2019-12-24 ENCOUNTER — Other Ambulatory Visit: Payer: Self-pay | Admitting: Podiatry

## 2019-12-24 ENCOUNTER — Telehealth: Payer: Self-pay | Admitting: Family Medicine

## 2019-12-24 NOTE — Telephone Encounter (Signed)
Communicated with patient that she would have to take order to [facility]

## 2019-12-24 NOTE — Telephone Encounter (Signed)
Per Sheliah Mends she did not order labs, or med and this would need to be followed and managed by podiatry she could not put her name on orders.

## 2019-12-24 NOTE — Telephone Encounter (Signed)
Patient wants to let Marijean Niemann know that she is not happy that she [place] or office manager has not followed up on dispute. She wants to know why she has to go through her PCP everything and she feels as if she is a number. Patient feels that Sheliah Mends does not give her enough time. And does not address her need namely her lab work. She she does not have to pay higher cost.Patient is wanting to know why Leisa did not address the fungus with an antibotic?  Patient is requesting chantix samples for smoking cessation.   Please advise Cb- [phone]

## 2019-12-24 NOTE — Telephone Encounter (Signed)
I have spoke to this patient 2 different times today and spent several minutes on the phone explaining to her that Sheliah Mends will not put her name on orders for lab from a different doctor, because she would be held accountable if something was wrong with them.  She was also told she would need to make an acute care visit for other problems to be addressed such as toes and ears. She was not able to address those concerns at her follow up due to time and it being a new problem.  She was also given [place] # to get In touch with her regarding chantix.

## 2019-12-26 ENCOUNTER — Other Ambulatory Visit: Payer: Self-pay

## 2019-12-26 ENCOUNTER — Emergency Department: Payer: Medicare (Managed Care)

## 2019-12-26 ENCOUNTER — Inpatient Hospital Stay
Admission: AD | Admit: 2019-12-26 | Discharge: 2019-12-31 | DRG: 357 | Disposition: A | Discharge status: Home / Self Care | Payer: Medicare (Managed Care) | Attending: Internal Medicine | Admitting: Internal Medicine

## 2019-12-26 DIAGNOSIS — I2510 Atherosclerotic heart disease of native coronary artery without angina pectoris: Secondary | ICD-10-CM | POA: Diagnosis present

## 2019-12-26 DIAGNOSIS — Z9884 Bariatric surgery status: Secondary | ICD-10-CM | POA: Diagnosis not present

## 2019-12-26 DIAGNOSIS — G8929 Other chronic pain: Secondary | ICD-10-CM | POA: Diagnosis present

## 2019-12-26 DIAGNOSIS — E1140 Type 2 diabetes mellitus with diabetic neuropathy, unspecified: Secondary | ICD-10-CM | POA: Diagnosis present

## 2019-12-26 DIAGNOSIS — H9190 Unspecified hearing loss, unspecified ear: Secondary | ICD-10-CM | POA: Diagnosis present

## 2019-12-26 DIAGNOSIS — Z886 Allergy status to analgesic agent status: Secondary | ICD-10-CM

## 2019-12-26 DIAGNOSIS — Z833 Family history of diabetes mellitus: Secondary | ICD-10-CM

## 2019-12-26 DIAGNOSIS — K529 Noninfective gastroenteritis and colitis, unspecified: Secondary | ICD-10-CM | POA: Diagnosis present

## 2019-12-26 DIAGNOSIS — Z7902 Long term (current) use of antithrombotics/antiplatelets: Secondary | ICD-10-CM | POA: Diagnosis not present

## 2019-12-26 DIAGNOSIS — I119 Hypertensive heart disease without heart failure: Secondary | ICD-10-CM | POA: Diagnosis not present

## 2019-12-26 DIAGNOSIS — Z90710 Acquired absence of both cervix and uterus: Secondary | ICD-10-CM | POA: Diagnosis not present

## 2019-12-26 DIAGNOSIS — Z825 Family history of asthma and other chronic lower respiratory diseases: Secondary | ICD-10-CM

## 2019-12-26 DIAGNOSIS — Z87891 Personal history of nicotine dependence: Secondary | ICD-10-CM

## 2019-12-26 DIAGNOSIS — Z20822 Contact with and (suspected) exposure to covid-19: Secondary | ICD-10-CM | POA: Diagnosis present

## 2019-12-26 DIAGNOSIS — I350 Nonrheumatic aortic (valve) stenosis: Secondary | ICD-10-CM | POA: Diagnosis present

## 2019-12-26 DIAGNOSIS — F17210 Nicotine dependence, cigarettes, uncomplicated: Secondary | ICD-10-CM | POA: Diagnosis present

## 2019-12-26 DIAGNOSIS — K55059 Acute (reversible) ischemia of intestine, part and extent unspecified: Secondary | ICD-10-CM | POA: Diagnosis not present

## 2019-12-26 DIAGNOSIS — R1030 Lower abdominal pain, unspecified: Secondary | ICD-10-CM | POA: Diagnosis not present

## 2019-12-26 DIAGNOSIS — Z981 Arthrodesis status: Secondary | ICD-10-CM

## 2019-12-26 DIAGNOSIS — F329 Major depressive disorder, single episode, unspecified: Secondary | ICD-10-CM | POA: Diagnosis present

## 2019-12-26 DIAGNOSIS — Z7289 Other problems related to lifestyle: Secondary | ICD-10-CM

## 2019-12-26 DIAGNOSIS — I10 Essential (primary) hypertension: Secondary | ICD-10-CM | POA: Diagnosis present

## 2019-12-26 DIAGNOSIS — K551 Chronic vascular disorders of intestine: Principal | ICD-10-CM | POA: Diagnosis present

## 2019-12-26 DIAGNOSIS — E119 Type 2 diabetes mellitus without complications: Secondary | ICD-10-CM

## 2019-12-26 DIAGNOSIS — E1151 Type 2 diabetes mellitus with diabetic peripheral angiopathy without gangrene: Secondary | ICD-10-CM | POA: Diagnosis present

## 2019-12-26 DIAGNOSIS — K625 Hemorrhage of anus and rectum: Secondary | ICD-10-CM | POA: Diagnosis present

## 2019-12-26 DIAGNOSIS — K5730 Diverticulosis of large intestine without perforation or abscess without bleeding: Secondary | ICD-10-CM | POA: Diagnosis present

## 2019-12-26 DIAGNOSIS — J302 Other seasonal allergic rhinitis: Secondary | ICD-10-CM | POA: Diagnosis present

## 2019-12-26 DIAGNOSIS — E871 Hypo-osmolality and hyponatremia: Secondary | ICD-10-CM | POA: Diagnosis present

## 2019-12-26 DIAGNOSIS — Z716 Tobacco abuse counseling: Secondary | ICD-10-CM

## 2019-12-26 DIAGNOSIS — Z9049 Acquired absence of other specified parts of digestive tract: Secondary | ICD-10-CM | POA: Diagnosis not present

## 2019-12-26 DIAGNOSIS — J449 Chronic obstructive pulmonary disease, unspecified: Secondary | ICD-10-CM | POA: Diagnosis present

## 2019-12-26 DIAGNOSIS — E785 Hyperlipidemia, unspecified: Secondary | ICD-10-CM | POA: Diagnosis present

## 2019-12-26 DIAGNOSIS — K219 Gastro-esophageal reflux disease without esophagitis: Secondary | ICD-10-CM | POA: Diagnosis present

## 2019-12-26 DIAGNOSIS — Z79899 Other long term (current) drug therapy: Secondary | ICD-10-CM

## 2019-12-26 DIAGNOSIS — K922 Gastrointestinal hemorrhage, unspecified: Secondary | ICD-10-CM

## 2019-12-26 DIAGNOSIS — Z888 Allergy status to other drugs, medicaments and biological substances status: Secondary | ICD-10-CM

## 2019-12-26 DIAGNOSIS — F17200 Nicotine dependence, unspecified, uncomplicated: Secondary | ICD-10-CM

## 2019-12-26 DIAGNOSIS — Z95828 Presence of other vascular implants and grafts: Secondary | ICD-10-CM

## 2019-12-26 DIAGNOSIS — Z8249 Family history of ischemic heart disease and other diseases of the circulatory system: Secondary | ICD-10-CM

## 2019-12-26 LAB — COMPREHENSIVE METABOLIC PANEL
ALT: 11 U/L (ref 0–44)
AST: 15 U/L (ref 15–41)
Albumin: 3.6 g/dL (ref 3.5–5.0)
Alkaline Phosphatase: 70 U/L (ref 38–126)
Anion gap: 9 (ref 5–15)
BUN: 18 mg/dL (ref 8–23)
CO2: 23 mmol/L (ref 22–32)
Calcium: 8.7 mg/dL — ABNORMAL LOW (ref 8.9–10.3)
Chloride: 92 mmol/L — ABNORMAL LOW (ref 98–111)
Creatinine, Ser: 0.9 mg/dL (ref 0.44–1.00)
GFR calc Af Amer: >60 mL/min (ref >60)
GFR calc non Af Amer: >60 mL/min (ref >60)
Glucose, Bld: 129 mg/dL — ABNORMAL HIGH (ref 70–99)
Potassium: 4.4 mmol/L (ref 3.5–5.1)
Sodium: 124 mmol/L — ABNORMAL LOW (ref 135–145)
Total Bilirubin: 0.7 mg/dL (ref 0.3–1.2)
Total Protein: 6.2 g/dL — ABNORMAL LOW (ref 6.5–8.1)

## 2019-12-26 LAB — TYPE AND SCREEN
ABO/RH(D): O POS
Antibody Screen: NEGATIVE

## 2019-12-26 LAB — CBC
HCT: 32.3 % — ABNORMAL LOW (ref 36.0–46.0)
Hemoglobin: 11.2 g/dL — ABNORMAL LOW (ref 12.0–15.0)
MCH: 27.7 pg (ref 26.0–34.0)
MCHC: 34.7 g/dL (ref 30.0–36.0)
MCV: 79.8 fL — ABNORMAL LOW (ref 80.0–100.0)
Platelets: 172 10*3/uL (ref 150–400)
RBC: 4.05 MIL/uL (ref 3.87–5.11)
RDW: 13.2 % (ref 11.5–15.5)
WBC: 16.3 10*3/uL — ABNORMAL HIGH (ref 4.0–10.5)
nRBC: 0 % (ref 0.0–0.2)

## 2019-12-26 LAB — GLUCOSE, CAPILLARY: Glucose-Capillary: 83 mg/dL (ref 70–99)

## 2019-12-26 LAB — SARS CORONAVIRUS 2 BY RT PCR (HOSPITAL ORDER, PERFORMED IN ~~LOC~~ HOSPITAL LAB): SARS Coronavirus 2: NEGATIVE

## 2019-12-26 MED ORDER — IOHEXOL 300 MG/ML  SOLN
75.0000 mL | Freq: Once | INTRAMUSCULAR | Status: AC | PRN
  Administered 2019-12-26: 75 mL via INTRAVENOUS

## 2019-12-26 MED ORDER — FLUTICASONE PROPIONATE 50 MCG/ACT NA SUSP
2.0000 | Freq: Every day | NASAL | Status: DC | PRN
  Filled 2019-12-26: qty 16

## 2019-12-26 MED ORDER — ASCORBIC ACID 500 MG PO TABS
1000.0000 mg | ORAL_TABLET | Freq: Every day | ORAL | Status: DC
  Administered 2019-12-27 – 2019-12-30 (×3): 1000 mg via ORAL
  Filled 2019-12-26 (×3): qty 2

## 2019-12-26 MED ORDER — UMECLIDINIUM-VILANTEROL 62.5-25 MCG/INH IN AEPB
1.0000 | INHALATION_SPRAY | Freq: Every day | RESPIRATORY_TRACT | Status: DC
  Administered 2019-12-27 – 2019-12-31 (×5): 1 via RESPIRATORY_TRACT
  Filled 2019-12-26: qty 14

## 2019-12-26 MED ORDER — PIPERACILLIN-TAZOBACTAM 3.375 G IVPB
3.3750 g | Freq: Three times a day (TID) | INTRAVENOUS | Status: DC
  Administered 2019-12-26 – 2019-12-30 (×11): 3.375 g via INTRAVENOUS
  Filled 2019-12-26 (×12): qty 50

## 2019-12-26 MED ORDER — ONDANSETRON HCL 4 MG PO TABS: 4.0000 mg | ORAL_TABLET | Freq: Four times a day (QID) | ORAL | Status: DC | PRN

## 2019-12-26 MED ORDER — PANTOPRAZOLE SODIUM 40 MG IV SOLR
40.0000 mg | INTRAVENOUS | Status: DC
  Administered 2019-12-27 – 2019-12-30 (×4): 40 mg via INTRAVENOUS
  Filled 2019-12-26 (×4): qty 40

## 2019-12-26 MED ORDER — INSULIN ASPART 100 UNIT/ML ~~LOC~~ SOLN
0.0000 [IU] | Freq: Three times a day (TID) | SUBCUTANEOUS | Status: DC
  Filled 2019-12-26: qty 1

## 2019-12-26 MED ORDER — NICOTINE 21 MG/24HR TD PT24
21.0000 mg | MEDICATED_PATCH | Freq: Every day | TRANSDERMAL | Status: DC
  Administered 2019-12-26 – 2019-12-30 (×5): 21 mg via TRANSDERMAL
  Filled 2019-12-26 (×5): qty 1

## 2019-12-26 MED ORDER — ONDANSETRON HCL 4 MG/2ML IJ SOLN: 4.0000 mg | Freq: Four times a day (QID) | INTRAMUSCULAR | Status: DC | PRN

## 2019-12-26 MED ORDER — SERTRALINE HCL 50 MG PO TABS
100.0000 mg | ORAL_TABLET | Freq: Every day | ORAL | Status: DC
  Administered 2019-12-27 – 2019-12-30 (×4): 100 mg via ORAL
  Filled 2019-12-26 (×4): qty 2

## 2019-12-26 MED ORDER — TERBINAFINE HCL 250 MG PO TABS
250.0000 mg | ORAL_TABLET | Freq: Every day | ORAL | Status: DC
  Administered 2019-12-27 – 2019-12-30 (×4): 250 mg via ORAL
  Filled 2019-12-26 (×5): qty 1

## 2019-12-26 MED ORDER — IPRATROPIUM-ALBUTEROL 0.5-2.5 (3) MG/3ML IN SOLN
3.0000 mL | Freq: Three times a day (TID) | RESPIRATORY_TRACT | Status: DC | PRN
  Administered 2019-12-27 – 2019-12-30 (×2): 3 mL via RESPIRATORY_TRACT
  Filled 2019-12-26 (×2): qty 3

## 2019-12-26 MED ORDER — GABAPENTIN 400 MG PO CAPS
400.0000 mg | ORAL_CAPSULE | Freq: Two times a day (BID) | ORAL | Status: DC
  Administered 2019-12-26 – 2019-12-30 (×9): 400 mg via ORAL
  Filled 2019-12-26 (×9): qty 1

## 2019-12-26 MED ORDER — VITAMIN D3 25 MCG (1000 UNIT) PO TABS
10000.0000 [IU] | ORAL_TABLET | Freq: Every day | ORAL | Status: DC
  Administered 2019-12-27 – 2019-12-30 (×3): 10000 [IU] via ORAL
  Filled 2019-12-26 (×7): qty 10

## 2019-12-26 MED ORDER — IPRATROPIUM-ALBUTEROL 0.5-2.5 (3) MG/3ML IN SOLN
3.0000 mL | Freq: Once | RESPIRATORY_TRACT | Status: AC
  Administered 2019-12-26: 3 mL via RESPIRATORY_TRACT
  Filled 2019-12-26: qty 3

## 2019-12-26 MED ORDER — SODIUM CHLORIDE 0.9 % IV SOLN: INTRAVENOUS | Status: DC

## 2019-12-26 MED ORDER — OXYCODONE HCL 5 MG PO TABS
5.0000 mg | ORAL_TABLET | Freq: Four times a day (QID) | ORAL | Status: DC | PRN
  Filled 2019-12-26: qty 1

## 2019-12-26 MED ORDER — MONTELUKAST SODIUM 10 MG PO TABS
10.0000 mg | ORAL_TABLET | Freq: Every day | ORAL | Status: DC
  Administered 2019-12-26 – 2019-12-30 (×5): 10 mg via ORAL
  Filled 2019-12-26 (×5): qty 1

## 2019-12-26 MED ORDER — ACETAMINOPHEN 325 MG PO TABS: 650.0000 mg | ORAL_TABLET | Freq: Four times a day (QID) | ORAL | Status: DC | PRN

## 2019-12-26 MED ORDER — ATORVASTATIN CALCIUM 10 MG PO TABS
10.0000 mg | ORAL_TABLET | Freq: Every day | ORAL | Status: DC
  Administered 2019-12-26 – 2019-12-30 (×5): 10 mg via ORAL
  Filled 2019-12-26 (×5): qty 1

## 2019-12-26 MED ORDER — CIPROFLOXACIN IN D5W 400 MG/200ML IV SOLN
400.0000 mg | Freq: Once | INTRAVENOUS | Status: AC
  Administered 2019-12-26: 400 mg via INTRAVENOUS
  Filled 2019-12-26: qty 200

## 2019-12-26 MED ORDER — ADULT MULTIVITAMIN W/MINERALS CH
1.0000 | ORAL_TABLET | Freq: Every day | ORAL | Status: DC
  Administered 2019-12-27 – 2019-12-30 (×3): 1 via ORAL
  Filled 2019-12-26 (×3): qty 1

## 2019-12-26 MED ORDER — METRONIDAZOLE IN NACL 5-0.79 MG/ML-% IV SOLN
500.0000 mg | Freq: Once | INTRAVENOUS | Status: AC
  Administered 2019-12-26: 500 mg via INTRAVENOUS
  Filled 2019-12-26: qty 100

## 2019-12-26 MED ORDER — SODIUM CHLORIDE 0.9 % IV BOLUS
1000.0000 mL | Freq: Once | INTRAVENOUS | Status: AC
  Administered 2019-12-26: 1000 mL via INTRAVENOUS

## 2019-12-26 MED ORDER — LISINOPRIL 10 MG PO TABS: 10.0000 mg | ORAL_TABLET | Freq: Every day | ORAL | Status: DC

## 2019-12-26 NOTE — H&P (Addendum)
History and Physical    Andrea Holden XFG:[id number] DOB: 02-Aug-1945 DOA: 12/26/2019  PCP: [name], PA-C   Patient coming from: Home  I have personally briefly reviewed patient's old medical records in [place]  Chief Complaint: Abdominal pain  HPI: Andrea Holden is a 74 y.o. female with medical history significant for diabetes mellitus, aortic stenosis, depression and anemia who presents to the ER for evaluation of rectal bleeding for the last 2 days.  Patient states that her symptoms initially started with nausea, vomiting and diarrhea and then she developed lower abdominal cramping mostly in the left lower quadrant with loose bloody stools. She states that she has had about 5 episodes. She denies any recent antibiotic use and has not had any fever or chills Rectal exam per nurse practitioner in the ER showed bright red blood. She had a CT scan of abdomen and pelvis done which showed colitis involving the descending and proximal sigmoid colon, question infection versus inflammatory bowel disease, ischemia considered less likely. No evidence of perforation or abscess. Tiny nonobstructing LEFT renal calculus. Labs showed a white count of 16.3, sodium of 124 and hemoglobin of 11.2g/dl.   ED Course: Patient presents to the emergency room for evaluation of left lower quadrant pain and rectal bleeding. Imaging is suggestive of colitis and patient has a white count of 16,000.  She will be admitted to the hospital for further evaluation  Review of Systems: As per HPI otherwise 10 point review of systems negative.    Past Medical History:  Diagnosis Date  . Allergy    seasonal allergies  . Anemia 2014   needed 5 units of blood d/t passing out, weak  . Anxiety   . Aortic stenosis 12/24/2017   Echo Aug 2018  . Arthritis   . Asthma    allergy induced asthma  . Cardiac murmur 12/12/2017  . Cataract   . Cataract    left  . Complication of anesthesia    arrhythmia  following colonoscopy  . Degenerative disc disease, lumbar   . Degenerative disc disease, lumbar   . Depression   . Diabetes mellitus   . Diabetic neuropathy ([place]) 11/16/2015  . Dysrhythmia    stenosis of left ventricle  . GERD (gastroesophageal reflux disease)   . H/O transfusion    patient was given 5 units of blood while at [place], blood type O+  . History of chicken pox   . History of hiatal hernia   . History of measles, mumps, or rubella   . HOH (hard of hearing)    does not use hearing aides yet  . Hyperlipidemia   . Hypertension   . Irregular heartbeat   . LVH (left ventricular hypertrophy) 12/24/2017   Echo Aug 2018  . Neuropathy   . Opiate use 11/16/2015  . Peripheral arterial disease ([place]) 05/06/2018   At rest, left > right; refer to vasc  . Pes planus of both feet 11/16/2015  . Plantar fasciitis of right foot 11/16/2015  . Pneumonia   . Wheezing     Past Surgical History:  Procedure Laterality Date  . ABDOMINAL HYSTERECTOMY    . APPLICATION OF WOUND VAC Left 07/23/2018   Procedure: APPLICATION OF WOUND VAC;  Surgeon: Algernon Huxley, MD;  Location: [facility] ORS;  Service: Vascular;  Laterality: Left;  . banck injections    . CATARACT EXTRACTION W/PHACO Right 05/30/2015   Procedure: CATARACT EXTRACTION PHACO AND INTRAOCULAR LENS PLACEMENT (IOC);  Surgeon: Gwyndolyn Saxon  Porfilio, MD;  Location: [facility] OR[facility];  Service: Ophthalmology;  Laterality: Right;  [place] 00:57 AP% 20.9 CDE 11.99 fluid pack lot #[id number]  . CATARACT EXTRACTION W/PHACO Left 11/11/2019   Procedure: CATARACT EXTRACTION PHACO AND INTRAOCULAR LENS PLACEMENT ([place]) LEFT DIABETIC;  Surgeon: Birder Robson, MD;  Location: [facility] OR[facility];  Service: Ophthalmology;  Laterality: Left;  Lot #[id number] H [place]: 00:47.5 CDE: 6.24  . CHOLECYSTECTOMY  1970  . COLON SURGERY  2013   blocked colon  . COLONOSCOPY WITH PROPOFOL N/A 01/20/2018   Procedure: COLONOSCOPY WITH PROPOFOL;  Surgeon: Lucilla Lame, MD;  Location: [facility] ENDOSCOPY;   Service: Endoscopy;  Laterality: N/A;  . ENDARTERECTOMY FEMORAL Left 07/08/2018   Procedure: ENDARTERECTOMY FEMORAL;  Surgeon: Algernon Huxley, MD;  Location: [facility] ORS;  Service: Vascular;  Laterality: Left;  . EYE SURGERY Right    cataract surgery  . FOOT FUSION Right 2018   metal in foot  . GALLBLADDER SURGERY  1970  . GASTRIC BYPASS  2010   lost 178 lbs and regained 40 lbs last few years  . HUMERUS FRACTURE SURGERY Right    metal plate with screws  . internal bleeding  2016   ulcer in past  . LOWER EXTREMITY ANGIOGRAM Left 07/08/2018   Procedure: LOWER EXTREMITY ANGIOGRAM;  Surgeon: Algernon Huxley, MD;  Location: [facility] ORS;  Service: Vascular;  Laterality: Left;  . LOWER EXTREMITY ANGIOGRAPHY Left 05/27/2018   Procedure: LOWER EXTREMITY ANGIOGRAPHY;  Surgeon: Algernon Huxley, MD;  Location: [place] [facility];  Service: Cardiovascular;  Laterality: Left;  . LOWER EXTREMITY ANGIOGRAPHY Right 12/07/2018   Procedure: LOWER EXTREMITY ANGIOGRAPHY;  Surgeon: Algernon Huxley, MD;  Location: [place] [facility];  Service: Cardiovascular;  Laterality: Right;  . LOWER EXTREMITY ANGIOGRAPHY Left 05/10/2019   Procedure: LOWER EXTREMITY ANGIOGRAPHY;  Surgeon: Algernon Huxley, MD;  Location: [place] [facility];  Service: Cardiovascular;  Laterality: Left;  . MOUTH SURGERY     root canals and crowns and extractions  . MOUTH SURGERY  09/30/2019  . OVARY SURGERY    . SKIN GRAFT Right 2018   RT foot. foot has been rebuilt.  it is full of metal  . TENOTOMY ACHILLES TENDON Right    Percuntaneous. metal in foot  . TONSILLECTOMY    . WOUND DEBRIDEMENT Left 07/23/2018   Procedure: DEBRIDEMENT WOUND;  Surgeon: Algernon Huxley, MD;  Location: [facility] ORS;  Service: Vascular;  Laterality: Left;     reports that she has been smoking cigarettes. She started smoking about 59 years ago. She has a 27.50 pack-year smoking history. She has never used smokeless tobacco. She reports current alcohol use. She reports previous drug use.  Drug: Marijuana.  Allergies  Allergen Reactions  . Meloxicam Other (See Comments)    GI bleeding  . Nsaids Other (See Comments)    Gi bleeding  . Sitagliptin Hives and Itching    Januvia     Family History  Problem Relation Age of Onset  . Asthma Mother   . COPD Mother   . Arthritis Father   . Depression Father   . Heart disease Father   . Hypertension Father   . Stroke Father   . Heart attack Father   . Arthritis Brother   . Depression Brother   . Diabetes Brother   . Heart disease Brother   . Hyperlipidemia Brother   . Hypertension Brother   . Stroke Brother   . Vision loss Brother   . Heart attack Brother   .  Diabetes Maternal Grandmother   . Thyroid disease Daughter   . Colitis Daughter   . Breast cancer Neg Hx      Prior to Admission medications   Medication Sig Start Date End Date Taking? Authorizing Provider  Acetaminophen 500 MG capsule Take 1,500 mg by mouth every 6 (six) hours as needed for pain.     [provider]  albuterol (VENTOLIN HFA) 108 (90 Base) MCG/ACT inhaler Inhale 2 puffs into the lungs every 6 (six) hours as needed for wheezing or shortness of breath. 03/11/19   [name], PA-C  atorvastatin (LIPITOR) 10 MG tablet Take 1 tablet (10 mg total) by mouth at bedtime. 08/24/19   [name], PA-C  Cholecalciferol (VITAMIN D3) 250 MCG (10000 UT) TABS Take 10,000 Units by mouth daily.    [provider]  clopidogrel (PLAVIX) 75 MG tablet Take 1 tablet (75 mg total) by mouth daily. 01/11/19   Minna Merritts, MD  fluticasone (FLONASE) 50 MCG/ACT nasal spray Place 2 sprays into both nostrils daily as needed. Patient taking differently: Place 2 sprays into both nostrils daily as needed for allergies.  08/24/19   [name], PA-C  gabapentin (NEURONTIN) 400 MG capsule Take 1 capsule (400 mg total) by mouth 2 (two) times daily. 08/24/19   [name], PA-C  ipratropium-albuterol (DUONEB) 0.5-2.5 (3) MG/3ML SOLN Take 3 mLs by  nebulization 3 (three) times daily as needed. Patient taking differently: Take 3 mLs by nebulization 3 (three) times daily as needed (Shortness of breath).  05/24/19   [name], PA-C  Krill Oil 1000 MG CAPS Take by mouth.    [provider]  lisinopril (ZESTRIL) 10 MG tablet Take 1 tablet (10 mg total) by mouth daily. 08/24/19   [name], PA-C  montelukast (SINGULAIR) 10 MG tablet Take 1 tablet (10 mg total) by mouth at bedtime. Patient not taking: Reported on 11/11/2019 08/24/19   [name], PA-C  Multiple Vitamins-Minerals (MULTIVITAMIN ADULTS 50+) TABS Take 1 tablet by mouth daily. 11/02/18   Poulose, Bethel Born, NP  pantoprazole (PROTONIX) 20 MG tablet Take 1 tablet (20 mg total) by mouth daily. 08/24/19   [name], PA-C  Respiratory Therapy Supplies (NEBULIZER/TUBING/MOUTHPIECE) KIT Disp one nebulizer machine, tubing set and mouthpiece kit 05/24/19   [name], PA-C  sertraline (ZOLOFT) 100 MG tablet TAKE 1 AND 1/2 TABLETS (150 MG TOTAL) BY MOUTH DAILY Patient taking differently: Take 100 mg by mouth daily.  08/24/19   [name], PA-C  terbinafine (LAMISIL) 250 MG tablet Take 1 tablet (250 mg total) by mouth daily. 12/21/19   Edrick Kins, DPM  umeclidinium-vilanterol (ANORO ELLIPTA) 62.5-25 MCG/INH AEPB Inhale 1 puff into the lungs daily. Patient not taking: Reported on 11/30/2019 08/24/19   [name], PA-C  vitamin C (ASCORBIC ACID) 500 MG tablet Take 1,000 mg by mouth daily.     [provider]    Physical Exam: Vitals:   12/26/19 1111 12/26/19 1248 12/26/19 1300 12/26/19 1707  BP:  (!) 159/48  (!) 155/68  Pulse:  (!) 57  73  Resp:   16 16  Temp:      TempSrc:      SpO2:  98%  96%  Weight: 83.9 kg     Height: _0  (1.549 m)        Vitals:   12/26/19 1111 12/26/19 1248 12/26/19 1300 12/26/19 1707  BP:  (!) 159/48  (!) 155/68  Pulse:  (!) 57  73  Resp:  16 16  Temp:      TempSrc:      SpO2:  98%  96%  Weight: 83.9 kg     Height: _0   (1.549 m)       Constitutional: NAD, alert and oriented x 3 Eyes: PERRL, lids and conjunctivae normal ENMT: Mucous membranes are moist.  Neck: normal, supple, no masses, no thyromegaly Respiratory: clear to auscultation bilaterally, no wheezing, no crackles. Normal respiratory effort. No accessory muscle use.  Cardiovascular: Regular rate and rhythm, no murmurs / rubs / gallops. No extremity edema. 2+ pedal pulses. No carotid bruits.  Abdomen: tenderness in the LLQ, no masses palpated. No hepatosplenomegaly. Bowel sounds positive.  Musculoskeletal: no clubbing / cyanosis. No joint deformity upper and lower extremities.  Skin: no rashes, lesions, ulcers.  Neurologic: No gross focal neurologic deficit. Psychiatric: Normal mood and affect.   Labs on Admission: I have personally reviewed following labs and imaging studies  CBC: Recent Labs  Lab 12/26/19 1112  WBC 16.3*  HGB 11.2*  HCT 32.3*  MCV 79.8*  PLT 250   Basic Metabolic Panel: Recent Labs  Lab 12/26/19 1112  NA 124*  K 4.4  CL 92*  CO2 23  GLUCOSE 129*  BUN 18  CREATININE 0.90  CALCIUM 8.7*   GFR: Estimated Creatinine Clearance: 55.5 mL/min (by C-G formula based on SCr of 0.9 mg/dL). Liver Function Tests: Recent Labs  Lab 12/26/19 1112  AST 15  ALT 11  ALKPHOS 70  BILITOT 0.7  PROT 6.2*  ALBUMIN 3.6   No results for input(s): LIPASE, AMYLASE in the last 168 hours. No results for input(s): AMMONIA in the last 168 hours. Coagulation Profile: No results for input(s): INR, PROTIME in the last 168 hours. Cardiac Enzymes: No results for input(s): CKTOTAL, CKMB, CKMBINDEX, TROPONINI in the last 168 hours. BNP (last 3 results) No results for input(s): PROBNP in the last 8760 hours. HbA1C: No results for input(s): HGBA1C in the last 72 hours. CBG: No results for input(s): GLUCAP in the last 168 hours. Lipid Profile: No results for input(s): CHOL, HDL, LDLCALC, TRIG, CHOLHDL, LDLDIRECT in the last 72  hours. Thyroid Function Tests: No results for input(s): TSH, T4TOTAL, FREET4, T3FREE, THYROIDAB in the last 72 hours. Anemia Panel: No results for input(s): VITAMINB12, FOLATE, FERRITIN, TIBC, IRON, RETICCTPCT in the last 72 hours. Urine analysis:    Component Value Date/Time   COLORURINE YELLOW 11/30/2019 1240   APPEARANCEUR CLEAR 11/30/2019 1240   LABSPEC 1.018 11/30/2019 1240   PHURINE < OR = 5.0 11/30/2019 1240   GLUCOSEU NEGATIVE 11/30/2019 1240   HGBUR NEGATIVE 11/30/2019 1240   [place] 11/30/2019 1240   PROTEINUR NEGATIVE 11/30/2019 1240   NITRITE NEGATIVE 11/30/2019 1240   LEUKOCYTESUR 1+ (A) 11/30/2019 1240    Radiological Exams on Admission: CT ABDOMEN PELVIS W CONTRAST  Result Date: 12/26/2019 CLINICAL DATA:  Acute abdominal pain, LEFT lower quadrant tenderness, leukocytosis, bright red blood per rectum; history diabetes mellitus, hypertension, smoker coronary artery disease EXAM: CT ABDOMEN AND PELVIS WITH CONTRAST TECHNIQUE: Multidetector CT imaging of the abdomen and pelvis was performed using the standard protocol following bolus administration of intravenous contrast. Sagittal and coronal MPR images reconstructed from axial data set. CONTRAST:  68m OMNIPAQUE IOHEXOL 300 MG/ML SOLN IV. No oral contrast. COMPARISON:  None FINDINGS: Lower chest: Lung bases clear Hepatobiliary: Gallbladder surgically absent. Low-attenuation focus LEFT lobe liver 8 mm greatest size likely tiny cyst. Remainder of liver unremarkable Pancreas: Atrophic pancreas without mass Spleen:  Normal appearance Adrenals/Urinary Tract: Adrenal glands normal appearance. Tiny nonobstructing LEFT renal calculus. Kidneys, ureters, and bladder otherwise normal appearance. Stomach/Bowel: Appendix not visualized. Wall thickening of the descending and proximal sigmoid colon consistent with colitis. Associated infiltration of surrounding mesocolon. No colonic diverticula identified. No evidence of perforation or  abscess. Prior bariatric surgery. Stomach and bowel loops otherwise unremarkable. Vascular/Lymphatic: Atherosclerotic calcifications aorta, visceral arteries, iliac arteries, coronary arteries. Aorta normal caliber. No adenopathy. Multiple pelvic phleboliths. Reproductive: Uterus surgically absent. Nonvisualization of ovaries. Other: No free air or free fluid. Musculoskeletal: Multilevel degenerative disc and facet disease changes lumbar spine. IMPRESSION: Colitis involving the descending and proximal sigmoid colon, question infection versus inflammatory bowel disease, ischemia considered less likely. No evidence of perforation or abscess. Tiny nonobstructing LEFT renal calculus. Aortic Atherosclerosis (ICD10-I70.0). Electronically Signed   By: Lavonia Dana M.D.   On: 12/26/2019 15:33    EKG: Independently reviewed.   Assessment/Plan Principal Problem:   Colitis Active Problems:   Hx of smoking   Diabetes mellitus without complication (HCC)   Hyponatremia   Hypertension with heart disease   Nicotine dependence   Rectal bleeding    Acute Colitis Patient presents for evaluation of left lower quadrant pain as well as rectal bleed which she has had for about 2 days She denies having any fever or chills CT scan of abdomen shows colitis involving the descending and proximal sigmoid colon, question infection versus inflammatory bowel disease, ischemia considered less likely. Patient has a leukocytosis which may be related to the rectal bleeding but is afebrile She received IV antibiotics (Ciprofloxacin/Flagyl)  in the emergency room We will place patient on Zosyn adjusted to renal function Serial H/H   Hyponatremia Most likely hypovolemic related to poor oral intake May also be related to SSRI use, patient is on Zoloft Most recent sodium level done June 1 was 137 Gentle IV fluid hydration Repeat renal parameters in a.m.   Rectal bleeding Most likely secondary to colitis Hemoglobin is  currently stable at 11g/dl Monitor H&H closely We will request GI consult   Diabetes mellitus Place patient on a clear liquid diet Sliding scale coverage with insulin   Depression  Continue Zoloft   Hypertension Hold lisinopril due to rectal bleeding   COPD Not in acute exacerbation Continue as needed bronchodilator therapy as well as inhaled steroids   Nicotine Dependence Smoking cessation was discussed with patient in detail Place patient on nicotine transdermal patch 58m  DVT prophylaxis: SCD Code Status: Full Family Communication: Greater than 50% of time was spent discussing plan of care with patient at bedside.  She verbalizes understanding and agrees with the plan Disposition Plan: Back to previous home environment Consults called: GI    Meldon Hanzlik MD [facility]     12/26/2019, 5:25 PM

## 2019-12-26 NOTE — Progress Notes (Signed)
   12/26/19 2010  Clinical Encounter Type  Visited With Patient  Visit Type Initial  Referral From Nurse  Consult/Referral To Chaplain  Spiritual Encounters  Spiritual Needs Emotional  CH completed order requisition for prayer. CH visited pt who was alert and awake upon arrival. Pt was talkative. CH participated in life review and participated in active/reflective listening. CH validated emotions and feelings. Prayer was provided upon request. No further needs were expressed at this time.

## 2019-12-26 NOTE — Plan of Care (Signed)
  Problem: Health Behavior/Discharge Planning: Goal: Ability to manage health-related needs will improve Outcome: Progressing   Problem: Clinical Measurements: Goal: Ability to maintain clinical measurements within normal limits will improve Outcome: Progressing Goal: Will remain free from infection Outcome: Progressing Goal: Respiratory complications will improve Outcome: Progressing Goal: Cardiovascular complication will be avoided Outcome: Progressing   Problem: Activity: Goal: Risk for activity intolerance will decrease Outcome: Progressing   Problem: Nutrition: Goal: Adequate nutrition will be maintained Outcome: Progressing   Problem: Coping: Goal: Level of anxiety will decrease Outcome: Progressing   Problem: Pain Managment: Goal: General experience of comfort will improve Outcome: Progressing   Problem: Safety: Goal: Ability to remain free from injury will improve Outcome: Progressing   Problem: Skin Integrity: Goal: Risk for impaired skin integrity will decrease Outcome: Progressing   

## 2019-12-26 NOTE — ED Triage Notes (Signed)
Pt comes POV with rectal bleeding starting Friday night. Pt states that she feels that she has to use the bathroom then bright red blood comes out. Pt recently got over n/v/d this previous week. Also endorses some left flank cramping.

## 2019-12-26 NOTE — Progress Notes (Signed)
Patient refuses bed alarm. States she doesn't need it. Patient is A&O x4 and able to ambulate without assistance.

## 2019-12-26 NOTE — ED Notes (Signed)
ED Provider at bedside. 

## 2019-12-26 NOTE — ED Notes (Addendum)
Admitting MD at bedside. Pt provided meal tray.

## 2019-12-26 NOTE — ED Provider Notes (Signed)
[facility] Emergency Department Provider Note ____________________________________________   First MD Initiated Contact with Patient 12/26/19 1315     (approximate)  I have reviewed the triage vital signs and the nursing notes.   HISTORY  Chief Complaint Rectal Bleeding  HPI Andrea Holden is a 74 y.o. female with a history of anemia, hyponatremia, diabetes, and other as listed below presenting to the emergency department for evaluation of rectal bleeding. She had several episodes of nausea, vomiting, and diarrhea Friday night then noticed bright red blood in the toilet on subsequent bowel movements. She also has lower abdominal cramping.          Past Medical History:  Diagnosis Date  . Allergy    seasonal allergies  . Anemia 2014   needed 5 units of blood d/t passing out, weak  . Anxiety   . Aortic stenosis 12/24/2017   Echo Aug 2018  . Arthritis   . Asthma    allergy induced asthma  . Cardiac murmur 12/12/2017  . Cataract   . Cataract    left  . Complication of anesthesia    arrhythmia following colonoscopy  . Degenerative disc disease, lumbar   . Degenerative disc disease, lumbar   . Depression   . Diabetes mellitus   . Diabetic neuropathy ([place]) 11/16/2015  . Dysrhythmia    stenosis of left ventricle  . GERD (gastroesophageal reflux disease)   . H/O transfusion    patient was given 5 units of blood while at [place], blood type O+  . History of chicken pox   . History of hiatal hernia   . History of measles, mumps, or rubella   . HOH (hard of hearing)    does not use hearing aides yet  . Hyperlipidemia   . Hypertension   . Irregular heartbeat   . LVH (left ventricular hypertrophy) 12/24/2017   Echo Aug 2018  . Neuropathy   . Opiate use 11/16/2015  . Peripheral arterial disease ([place]) 05/06/2018   At rest, left > right; refer to vasc  . Pes planus of both feet 11/16/2015  . Plantar fasciitis of right foot 11/16/2015  .  Pneumonia   . Wheezing     Patient Active Problem List   Diagnosis Date Noted  . Rectal bleeding 12/26/2019  . Colitis 12/26/2019  . Tobacco use disorder 06/15/2019  . Osteopenia 03/03/2019  . Lower extremity edema 07/31/2018  . Pressure injury of skin 07/24/2018  . Surgical site infection 07/23/2018  . Wound of left leg 07/23/2018  . Atherosclerosis of artery of extremity with rest pain ([place]) 07/08/2018  . Shortness of breath 06/02/2018  . Bruit 06/02/2018  . Coronary artery disease of native artery of native heart with stable angina pectoris ([place]) 06/02/2018  . Atherosclerosis of native arteries of extremity with intermittent claudication ([place]) 05/19/2018  . Peripheral arterial disease ([place]) 05/06/2018  . Personal history of colonic polyps   . Aortic stenosis 12/24/2017  . LVH (left ventricular hypertrophy) 12/24/2017  . Hypertension with heart disease 12/24/2017  . Left atrial dilatation 12/12/2017  . Cardiac murmur 12/12/2017  . Gastrojejunal ulcer 12/04/2017  . Hyperphosphatemia 12/04/2017  . Spinal stenosis of lumbar region 06/18/2017  . Degeneration of lumbar intervertebral disc 06/02/2017  . Assistance needed with transportation 05/26/2017  . Financial difficulties 05/26/2017  . Needs assistance with community resources 05/26/2017  . Moderate recurrent major depression ([place]) 05/26/2017  . Non-healing wound of lower extremity 02/06/2017  . Status post ankle arthrodesis  12/11/2016  . Controlled substance agreement broken 10/11/2016  . Low back pain 09/25/2016  . Osteoarthritis of right subtalar joint 07/11/2016  . Posterior tibial tendinitis of right lower extremity 07/11/2016  . Breast cancer screening 06/18/2016  . Knee pain 04/03/2016  . Hyponatremia 03/01/2016  . High triglycerides 12/24/2015  . Pes planus of both feet 11/16/2015  . Plantar fasciitis of right foot 11/16/2015  . Heel spur 11/16/2015  . Diabetic neuropathy ([place]) 11/16/2015  . Right ankle pain  11/16/2015  . Medication monitoring encounter 11/16/2015  . Diabetes mellitus without complication ([place]) 32/20/2542  . Chronic pain of multiple joints 08/15/2015  . Abnormal mammogram of right breast 06/19/2015  . Cerumen impaction 03/16/2015  . Hyperlipidemia 12/14/2014  . History of transfusion of packed RBC 12/14/2014  . Hx of smoking 12/14/2014  . Status post bariatric surgery 12/14/2014  . Morbid obesity ([place]) 12/14/2014  . Chronic radicular lumbar pain 12/14/2014  . History of GI bleed 11/12/2014  . History of small bowel obstruction 09/07/2014    Past Surgical History:  Procedure Laterality Date  . ABDOMINAL HYSTERECTOMY    . APPLICATION OF WOUND VAC Left 07/23/2018   Procedure: APPLICATION OF WOUND VAC;  Surgeon: Algernon Huxley, MD;  Location: [facility] ORS;  Service: Vascular;  Laterality: Left;  . banck injections    . CATARACT EXTRACTION W/PHACO Right 05/30/2015   Procedure: CATARACT EXTRACTION PHACO AND INTRAOCULAR LENS PLACEMENT (IOC);  Surgeon: Birder Robson, MD;  Location: [facility] ORS;  Service: Ophthalmology;  Laterality: Right;  [place] 00:57 AP% 20.9 CDE 11.99 fluid pack lot #[id number]  . CATARACT EXTRACTION W/PHACO Left 11/11/2019   Procedure: CATARACT EXTRACTION PHACO AND INTRAOCULAR LENS PLACEMENT ([place]) LEFT DIABETIC;  Surgeon: Birder Robson, MD;  Location: [facility] ORS;  Service: Ophthalmology;  Laterality: Left;  Lot #[id number] H [place]: 00:47.5 CDE: 6.24  . CHOLECYSTECTOMY  1970  . COLON SURGERY  2013   blocked colon  . COLONOSCOPY WITH PROPOFOL N/A 01/20/2018   Procedure: COLONOSCOPY WITH PROPOFOL;  Surgeon: Lucilla Lame, MD;  Location[facility] ENDOSCOPY;  Service: Endoscopy;  Laterality: N/A;  . ENDARTERECTOMY FEMORAL Left 07/08/2018   Procedure: ENDARTERECTOMY FEMORAL;  Surgeon: Algernon Huxley, MD;  Location: [facility] ORS;  Service: Vascular;  Laterality: Left;  . EYE SURGERY Right    cataract surgery  . FOOT FUSION Right 2018   metal in foot  . GALLBLADDER SURGERY  1970  .  GASTRIC BYPASS  2010   lost 178 lbs and regained 40 lbs last few years  . HUMERUS FRACTURE SURGERY Right    metal plate with screws  . internal bleeding  2016   ulcer in past  . LOWER EXTREMITY ANGIOGRAM Left 07/08/2018   Procedure: LOWER EXTREMITY ANGIOGRAM;  Surgeon: Algernon Huxley, MD;  Location: [facility] ORS;  Service: Vascular;  Laterality: Left;  . LOWER EXTREMITY ANGIOGRAPHY Left 05/27/2018   Procedure: LOWER EXTREMITY ANGIOGRAPHY;  Surgeon: Algernon Huxley, MD;  Location: [place] [facility];  Service: Cardiovascular;  Laterality: Left;  . LOWER EXTREMITY ANGIOGRAPHY Right 12/07/2018   Procedure: LOWER EXTREMITY ANGIOGRAPHY;  Surgeon: Algernon Huxley, MD;  Location: [place] [facility];  Service: Cardiovascular;  Laterality: Right;  . LOWER EXTREMITY ANGIOGRAPHY Left 05/10/2019   Procedure: LOWER EXTREMITY ANGIOGRAPHY;  Surgeon: Algernon Huxley, MD;  Location: [place] [facility];  Service: Cardiovascular;  Laterality: Left;  . MOUTH SURGERY     root canals and crowns and extractions  . MOUTH SURGERY  09/30/2019  . OVARY SURGERY    .  SKIN GRAFT Right 2018   RT foot. foot has been rebuilt.  it is full of metal  . TENOTOMY ACHILLES TENDON Right    Percuntaneous. metal in foot  . TONSILLECTOMY    . WOUND DEBRIDEMENT Left 07/23/2018   Procedure: DEBRIDEMENT WOUND;  Surgeon: Algernon Huxley, MD;  Location: [facility] ORS;  Service: Vascular;  Laterality: Left;    Prior to Admission medications   Medication Sig Start Date End Date Taking? Authorizing Provider  Acetaminophen 500 MG capsule Take 1,500 mg by mouth every 6 (six) hours as needed for pain.     [provider]  albuterol (VENTOLIN HFA) 108 (90 Base) MCG/ACT inhaler Inhale 2 puffs into the lungs every 6 (six) hours as needed for wheezing or shortness of breath. 03/11/19   [name], PA-C  atorvastatin (LIPITOR) 10 MG tablet Take 1 tablet (10 mg total) by mouth at bedtime. 08/24/19   [name], PA-C  Cholecalciferol (VITAMIN D3) 250 MCG  (10000 UT) TABS Take 10,000 Units by mouth daily.    [provider]  clopidogrel (PLAVIX) 75 MG tablet Take 1 tablet (75 mg total) by mouth daily. 01/11/19   Minna Merritts, MD  fluticasone (FLONASE) 50 MCG/ACT nasal spray Place 2 sprays into both nostrils daily as needed. Patient taking differently: Place 2 sprays into both nostrils daily as needed for allergies.  08/24/19   [name], PA-C  gabapentin (NEURONTIN) 400 MG capsule Take 1 capsule (400 mg total) by mouth 2 (two) times daily. 08/24/19   [name], PA-C  ipratropium-albuterol (DUONEB) 0.5-2.5 (3) MG/3ML SOLN Take 3 mLs by nebulization 3 (three) times daily as needed. Patient taking differently: Take 3 mLs by nebulization 3 (three) times daily as needed (Shortness of breath).  05/24/19   [name], PA-C  Krill Oil 1000 MG CAPS Take by mouth.    [provider]  lisinopril (ZESTRIL) 10 MG tablet Take 1 tablet (10 mg total) by mouth daily. 08/24/19   [name], PA-C  montelukast (SINGULAIR) 10 MG tablet Take 1 tablet (10 mg total) by mouth at bedtime. Patient not taking: Reported on 11/11/2019 08/24/19   [name], PA-C  Multiple Vitamins-Minerals (MULTIVITAMIN ADULTS 50+) TABS Take 1 tablet by mouth daily. 11/02/18   Poulose, Bethel Born, NP  pantoprazole (PROTONIX) 20 MG tablet Take 1 tablet (20 mg total) by mouth daily. 08/24/19   [name], PA-C  Respiratory Therapy Supplies (NEBULIZER/TUBING/MOUTHPIECE) KIT Disp one nebulizer machine, tubing set and mouthpiece kit 05/24/19   [name], PA-C  sertraline (ZOLOFT) 100 MG tablet TAKE 1 AND 1/2 TABLETS (150 MG TOTAL) BY MOUTH DAILY Patient taking differently: Take 100 mg by mouth daily.  08/24/19   [name], PA-C  terbinafine (LAMISIL) 250 MG tablet Take 1 tablet (250 mg total) by mouth daily. 12/21/19   Edrick Kins, DPM  umeclidinium-vilanterol (ANORO ELLIPTA) 62.5-25 MCG/INH AEPB Inhale 1 puff into the lungs daily. Patient not taking: Reported on  11/30/2019 08/24/19   [name], PA-C  vitamin C (ASCORBIC ACID) 500 MG tablet Take 1,000 mg by mouth daily.     [provider]    Allergies Meloxicam, Nsaids, and Sitagliptin  Family History  Problem Relation Age of Onset  . Asthma Mother   . COPD Mother   . Arthritis Father   . Depression Father   . Heart disease Father   . Hypertension Father   . Stroke Father   . Heart attack Father   . Arthritis Brother   .  Depression Brother   . Diabetes Brother   . Heart disease Brother   . Hyperlipidemia Brother   . Hypertension Brother   . Stroke Brother   . Vision loss Brother   . Heart attack Brother   . Diabetes Maternal Grandmother   . Thyroid disease Daughter   . Colitis Daughter   . Breast cancer Neg Hx     Social History Social History   Tobacco Use  . Smoking status: Current Some Day Smoker    Packs/day: 0.50    Years: 55.00    Pack years: 27.50    Types: Cigarettes    Start date: 07/01/1960    Last attempt to quit: 11/22/2018    Years since quitting: 1.0  . Smokeless tobacco: Never Used  . Tobacco comment: trying to quit again, using chantix  Vaping Use  . Vaping Use: Never used  Substance Use Topics  . Alcohol use: Yes    Alcohol/week: 0.0 standard drinks    Comment: 2 drinks per month  . Drug use: Not Currently    Types: Marijuana    Comment: used for pain    Review of Systems  Constitutional: No fever/chills Eyes: No visual changes. ENT: No sore throat. Cardiovascular: Denies chest pain. Respiratory: Denies shortness of breath. Gastrointestinal: Positive for lower abdominal pain.  Positive for nausea, vomiting, and diarrhea, Genitourinary: Negative for dysuria. Musculoskeletal: Negative for back pain. Skin: Negative for rash. Neurological: Negative for headaches, focal weakness or numbness. ____________________________________________   PHYSICAL EXAM:  VITAL SIGNS: ED Triage Vitals  Enc Vitals Group     BP 12/26/19 1110 (!)  141/47     Pulse Rate 12/26/19 1110 70     Resp 12/26/19 1110 18     Temp 12/26/19 1110 98.5 F (36.9 C)     Temp Source 12/26/19 1110 Oral     SpO2 12/26/19 1110 97 %     Weight 12/26/19 1111 185 lb (83.9 kg)     Height 12/26/19 1111 _0  (1.549 m)     Head Circumference --      Peak Flow --      Pain Score 12/26/19 1110 5     Pain Loc --      Pain Edu? --      Excl. in [place]? --     Constitutional: Alert and oriented. Well appearing and in no acute distress. Eyes: Conjunctivae are normal. PERRL.  Head: Atraumatic. Nose: No congestion/rhinnorhea. Mouth/Throat: Mucous membranes are moist.  Oropharynx non-erythematous. Neck: No stridor.   Hematological/Lymphatic/Immunilogical: No cervical lymphadenopathy. Cardiovascular: Normal rate, regular rhythm. Grossly normal heart sounds.  Good peripheral circulation. Respiratory: Normal respiratory effort.  No retractions. Lungs CTAB. Gastrointestinal: Soft with diffuse lower abdominal tenderness. No distention. No abdominal bruits. No CVA tenderness. Genitourinary:  Musculoskeletal: No lower extremity tenderness nor edema.  No joint effusions. Neurologic:  Normal speech and language. No gross focal neurologic deficits are appreciated. No gait instability. Skin:  Skin is warm, dry and intact. No rash noted. Chronic wound on the right medial foot covered with bandage. Psychiatric: Mood and affect are normal. Speech and behavior are normal.  ____________________________________________   LABS (all labs ordered are listed, but only abnormal results are displayed)  Labs Reviewed  COMPREHENSIVE METABOLIC PANEL - Abnormal; Notable for the following components:      Result Value   Sodium 124 (*)    Chloride 92 (*)    Glucose, Bld 129 (*)    Calcium 8.7 (*)  Total Protein 6.2 (*)    All other components within normal limits  CBC - Abnormal; Notable for the following components:   WBC 16.3 (*)    Hemoglobin 11.2 (*)    HCT 32.3 (*)     MCV 79.8 (*)    All other components within normal limits  SARS CORONAVIRUS 2 BY RT PCR (HOSPITAL ORDER, [facility])  POC OCCULT BLOOD, ED  TYPE AND SCREEN   ____________________________________________  EKG  Not indicated. ____________________________________________  RADIOLOGY  ED MD interpretation:    CT of the abdomen pelvis with contrast shows a colitis of the distending and sigmoid without I, Tyneisha Hegeman, personally viewed and evaluated these images (plain radiographs) as part of my medical decision making, as well as reviewing the written report by the radiologist.  Official radiology report(s): CT ABDOMEN PELVIS W CONTRAST  Result Date: 12/26/2019 CLINICAL DATA:  Acute abdominal pain, LEFT lower quadrant tenderness, leukocytosis, bright red blood per rectum; history diabetes mellitus, hypertension, smoker coronary artery disease EXAM: CT ABDOMEN AND PELVIS WITH CONTRAST TECHNIQUE: Multidetector CT imaging of the abdomen and pelvis was performed using the standard protocol following bolus administration of intravenous contrast. Sagittal and coronal MPR images reconstructed from axial data set. CONTRAST:  56m OMNIPAQUE IOHEXOL 300 MG/ML SOLN IV. No oral contrast. COMPARISON:  None FINDINGS: Lower chest: Lung bases clear Hepatobiliary: Gallbladder surgically absent. Low-attenuation focus LEFT lobe liver 8 mm greatest size likely tiny cyst. Remainder of liver unremarkable Pancreas: Atrophic pancreas without mass Spleen: Normal appearance Adrenals/Urinary Tract: Adrenal glands normal appearance. Tiny nonobstructing LEFT renal calculus. Kidneys, ureters, and bladder otherwise normal appearance. Stomach/Bowel: Appendix not visualized. Wall thickening of the descending and proximal sigmoid colon consistent with colitis. Associated infiltration of surrounding mesocolon. No colonic diverticula identified. No evidence of perforation or abscess. Prior bariatric  surgery. Stomach and bowel loops otherwise unremarkable. Vascular/Lymphatic: Atherosclerotic calcifications aorta, visceral arteries, iliac arteries, coronary arteries. Aorta normal caliber. No adenopathy. Multiple pelvic phleboliths. Reproductive: Uterus surgically absent. Nonvisualization of ovaries. Other: No free air or free fluid. Musculoskeletal: Multilevel degenerative disc and facet disease changes lumbar spine. IMPRESSION: Colitis involving the descending and proximal sigmoid colon, question infection versus inflammatory bowel disease, ischemia considered less likely. No evidence of perforation or abscess. Tiny nonobstructing LEFT renal calculus. Aortic Atherosclerosis (ICD10-I70.0). Electronically Signed   By: MLavonia DanaM.D.   On: 12/26/2019 15:33    ____________________________________________   PROCEDURES  Procedure(s) performed (including Critical Care):  Procedures  ____________________________________________   INITIAL IMPRESSION / ASSESSMENT AND PLAN     74year old female presenting to the emergency department for evaluation after several episodes of bright red blood in the toilet bowl.  Symptoms started 2 nights ago.  She has a self-reported history GI bleed that led to anemia requiring transfusion in 2014.  See HPI for further details.  Plan will be to review labs, perform Hemoccult, and get a CT with contrast of the abdomen and pelvis due to pain.  She denies nausea and has not vomited since Friday but states that she also does not have an appetite and has not eaten anything since a small amount of soup yesterday.  DIFFERENTIAL DIAGNOSIS  Lower GI bleed, colitis, diverticulosis, diverticulitis  ED COURSE  Chart review shows that her last colonoscopy was in July 2019.  No polyps or biopsies taken at that time.  She was advised to have a repeat colonoscopy for routine surveillance in 5 years.  On DRE, she does have  bright red blood per rectum which is confirmed by  positive Hemoccult.  She is wearing a pad with a small amount of bright red blood.  No clots noted.  Hemoglobin is noted to be 11.2 with a hematocrit of 32.3.  CT of the abdomen pelvis shows colitis of the descending and proximal sigmoid colon which is infectious versus inflammatory.  Because she also has a leukocytosis at 16.3 and diarrhea, we will treat her with ciprofloxacin and Flagyl.  Also of note, her sodium is 124.  She reports that this is chronic but 124 is on the lower side of her normal.  Normal saline is infusing.  ----------------------------------------- 4:01 PM on 12/26/2019 ----------------------------------------- Dr. Vicente Males with GI paged for consult. Awaiting response.   ----------------------------------------- 4:14 PM on 12/26/2019 -----------------------------------------  Episode of bright red blood in toilet. She believes this is the 4th today. No formed stool noted. Vital signs remain stable.   ----------------------------------------- 4:24 PM on 12/26/2019 -----------------------------------------  Dr. Vicente Males with GI will see patient in consult. Hospitalist paged for admission. Patient aware and agrees to the plan.  ----------------------------------------- 4:49 PM on 12/26/2019 -----------------------------------------  Admission accepted by hospitalist.  ____________________________________________   FINAL CLINICAL IMPRESSION(S) / ED DIAGNOSES  Final diagnoses:  Acute lower GI bleeding  Colitis  Lower abdominal pain  Chronic hyponatremia     ED Discharge Orders    None       Andrea Holden was evaluated in Emergency Department on 12/26/2019 for the symptoms described in the history of present illness. She was evaluated in the context of the global COVID-19 pandemic, which necessitated consideration that the patient might be at risk for infection with the SARS-CoV-2 virus that causes COVID-19. Institutional protocols and algorithms that pertain  to the evaluation of patients at risk for COVID-19 are in a state of rapid change based on information released by regulatory bodies including the CDC and federal and state organizations. These policies and algorithms were followed during the patient's care in the ED.   Note:  This document was prepared using Dragon voice recognition software and may include unintentional dictation errors.   Victorino Dike, FNP 12/26/19 1649    Harvest Dark, MD 12/29/19 1423

## 2019-12-26 NOTE — ED Notes (Signed)
Pt ambulatory to toilet with cane. Steady gait noted.  

## 2019-12-26 NOTE — Progress Notes (Signed)
Pharmacy Antibiotic Note  Andrea Holden is a 74 y.o. female admitted on 12/26/2019 with Colitis.  Pharmacy has been consulted for Zosyn dosing.  Plan: Zosyn 3.375g IV q8h (4 hour infusion).  Height: 5\' 1"  (154.9 cm) Weight: 83.9 kg (185 lb) IBW/kg (Calculated) : 47.8  Temp (24hrs), Avg:98 F (36.7 C), Min:97.5 F (36.4 C), Max:98.5 F (36.9 C)  Recent Labs  Lab 12/26/19 1112  WBC 16.3*  CREATININE 0.90    Estimated Creatinine Clearance: 55.5 mL/min (by C-G formula based on SCr of 0.9 mg/dL).    Allergies  Allergen Reactions  . Meloxicam Other (See Comments)    GI bleeding  . Nsaids Other (See Comments)    Gi bleeding  . Sitagliptin Hives and Itching    Januvia     Antimicrobials this admission: Cipro/Flagyl 6/27 x 1 in ED Zosyn 6/27 >>   Dose adjustments this admission:  Microbiology results:  Thank you for allowing pharmacy to be a part of this patient's care.  7/27, PharmD, BCPS 12/26/2019 8:21 PM

## 2019-12-26 NOTE — ED Notes (Signed)
Pt returned from CT at this time.  

## 2019-12-26 NOTE — ED Provider Notes (Signed)
-----------------------------------------   2:22 PM on 12/26/2019 -----------------------------------------  I have personally seen and evaluated the patient in conjunction with nurse practitioner [facility].  Overall patient appears well she does have left lower quadrant tenderness.  Patient's lab work shows leukocytosis of 16,000, hemoglobin is dropped approximately one-point from recent lab values.  Sodium is 124.  Patient states she always has low sodium however per record review here in care everywhere it appears that it is typically in the 130s.  Patient states bright red blood per rectum.  Rectal exam per nurse practitioner was bright red blood.  Given the left lower quadrant tenderness we will proceed with CT imaging to rule out colitis or diverticulitis.  Regardless of the CT findings I believe the patient requires admission to the hospital for further work-up and treatment.   Minna Antis, MD 12/26/19 1423

## 2019-12-27 LAB — BASIC METABOLIC PANEL
Anion gap: 7 (ref 5–15)
BUN: 18 mg/dL (ref 8–23)
CO2: 24 mmol/L (ref 22–32)
Calcium: 8.1 mg/dL — ABNORMAL LOW (ref 8.9–10.3)
Chloride: 99 mmol/L (ref 98–111)
Creatinine, Ser: 1.02 mg/dL — ABNORMAL HIGH (ref 0.44–1.00)
GFR calc Af Amer: >60 mL/min (ref >60)
GFR calc non Af Amer: 55 mL/min — ABNORMAL LOW (ref >60)
Glucose, Bld: 121 mg/dL — ABNORMAL HIGH (ref 70–99)
Potassium: 5 mmol/L (ref 3.5–5.1)
Sodium: 130 mmol/L — ABNORMAL LOW (ref 135–145)

## 2019-12-27 LAB — GLUCOSE, CAPILLARY
Glucose-Capillary: 106 mg/dL — ABNORMAL HIGH (ref 70–99)
Glucose-Capillary: 175 mg/dL — ABNORMAL HIGH (ref 70–99)
Glucose-Capillary: 102 mg/dL — ABNORMAL HIGH (ref 70–99)

## 2019-12-27 LAB — HEMOGLOBIN AND HEMATOCRIT, BLOOD
HCT: 29.1 % — ABNORMAL LOW (ref 36.0–46.0)
HCT: 31 % — ABNORMAL LOW (ref 36.0–46.0)
Hemoglobin: 9.9 g/dL — ABNORMAL LOW (ref 12.0–15.0)
Hemoglobin: 10.4 g/dL — ABNORMAL LOW (ref 12.0–15.0)

## 2019-12-27 NOTE — Plan of Care (Signed)
Received pt alert and oriented x4 verbal denies pain. Independent in room continues NS @ 185ml/hr, continues IV ABTs, awaiting stool sample to be sent to r/o C-diff. Precautionary enteric measures taken. Pt now NPO to allow her bowel to rest from her colitis. Inspiratory wheezes noted upon assessment R to called to administer PRN neb treatment this AM. Continue with current care plan staff will continue to monitor

## 2019-12-27 NOTE — Consult Note (Signed)
Andrea Antigua, MD [address], [place], [place], [place], [address] [address], [place], [place], [place], [address] Phone: [phone]  Fax: [phone]  Consultation  Referring Provider:     Dr. Kurtis Bushman Primary Care Physician:  [name], PA-C Reason for Consultation:    Rectal bleeding  Date of Admission:  12/26/2019 Date of Consultation:  12/27/2019         HPI:   Andrea Holden is a 74 y.o. female presents for rectal bleeding for 3 days.  Patient reports seeing bright red blood in the toilet bowl with stool, every day since symptoms started.  However, has not had any bleeding today.  States last bowel movement was 2 days ago and since then has only had blood via rectum with no stool. Reports recent antibiotic use for dental issues. Last colonoscopy July 2019 with Dr. Allen Norris with diverticulosis and internal hemorrhoids noted.   Patient is on Plavix at home and last dose was Friday night.  No nausea or vomiting.  No abdominal pain.  Past Medical History:  Diagnosis Date  . Allergy    seasonal allergies  . Anemia 2014   needed 5 units of blood d/t passing out, weak  . Anxiety   . Aortic stenosis 12/24/2017   Echo Aug 2018  . Arthritis   . Asthma    allergy induced asthma  . Cardiac murmur 12/12/2017  . Cataract   . Cataract    left  . Complication of anesthesia    arrhythmia following colonoscopy  . Degenerative disc disease, lumbar   . Degenerative disc disease, lumbar   . Depression   . Diabetes mellitus   . Diabetic neuropathy ([place]) 11/16/2015  . Dysrhythmia    stenosis of left ventricle  . GERD (gastroesophageal reflux disease)   . H/O transfusion    patient was given 5 units of blood while at [place], blood type O+  . History of chicken pox   . History of hiatal hernia   . History of measles, mumps, or rubella   . HOH (hard of hearing)    does not use hearing aides yet  . Hyperlipidemia   . Hypertension   . Irregular heartbeat   . LVH  (left ventricular hypertrophy) 12/24/2017   Echo Aug 2018  . Neuropathy   . Opiate use 11/16/2015  . Peripheral arterial disease ([place]) 05/06/2018   At rest, left > right; refer to vasc  . Pes planus of both feet 11/16/2015  . Plantar fasciitis of right foot 11/16/2015  . Pneumonia   . Wheezing     Past Surgical History:  Procedure Laterality Date  . ABDOMINAL HYSTERECTOMY    . APPLICATION OF WOUND VAC Left 07/23/2018   Procedure: APPLICATION OF WOUND VAC;  Surgeon: Algernon Huxley, MD;  Location: [facility];  Service: Vascular;  Laterality: Left;  . banck injections    . CATARACT EXTRACTION W/PHACO Right 05/30/2015   Procedure: CATARACT EXTRACTION PHACO AND INTRAOCULAR LENS PLACEMENT (I[facility]);  Surgeon: Birder Robson, MD;  Location: [facility];  Service: Ophthalmology;  Laterality: Right;  [place] 00:57 AP% 20.9 CDE 11.99 fluid pack lot #[id number]  . CATARACT EXTRACTION W/PHACO Left 11/11/2019   Procedure: CATARACT EXTRACTION PHACO AND INTRAOCULAR LENS PLACEMENT ([place]) LEFT DIABETIC;  Surgeon: Birder Robson, MD;  Location: [facility];  Service: Ophthalmology;  Laterality: Left;  Lot #[id number] H [place]: 00:47.5 CDE: 6.24  . CHOLECYSTECTOMY  1970  . COLON SURGERY  2013   blocked colon  .  COLONOSCOPY WITH PROPOFOL N/A 01/20/2018   Procedure: COLONOSCOPY WITH PROPOFOL;  Surgeon: Lucilla Lame, MD;  Location[facility];  Service: Endoscopy;  Laterality: N/A;  . ENDARTERECTOMY FEMORAL Left 07/08/2018   Procedure: ENDARTERECTOMY FEMORAL;  Surgeon: Algernon Huxley, MD;  Location: [facility];  Service: Vascular;  Laterality: Left;  . EYE SURGERY Right    cataract surgery  . FOOT FUSION Right 2018   metal in foot  . GALLBLADDER SURGERY  1970  . GASTRIC BYPASS  2010   lost 178 lbs and regained 40 lbs last few years  . HUMERUS FRACTURE SURGERY Right    metal plate with screws  . internal bleeding  2016   ulcer in past  . LOWER EXTREMITY ANGIOGRAM Left 07/08/2018   Procedure: LOWER EXTREMITY ANGIOGRAM;  Surgeon:  Algernon Huxley, MD;  Location: [facility];  Service: Vascular;  Laterality: Left;  . LOWER EXTREMITY ANGIOGRAPHY Left 05/27/2018   Procedure: LOWER EXTREMITY ANGIOGRAPHY;  Surgeon: Algernon Huxley, MD;  Location: [facility];  Service: Cardiovascular;  Laterality: Left;  . LOWER EXTREMITY ANGIOGRAPHY Right 12/07/2018   Procedure: LOWER EXTREMITY ANGIOGRAPHY;  Surgeon: Algernon Huxley, MD;  Location: [facility];  Service: Cardiovascular;  Laterality: Right;  . LOWER EXTREMITY ANGIOGRAPHY Left 05/10/2019   Procedure: LOWER EXTREMITY ANGIOGRAPHY;  Surgeon: Algernon Huxley, MD;  Location: [facility];  Service: Cardiovascular;  Laterality: Left;  . MOUTH SURGERY     root canals and crowns and extractions  . MOUTH SURGERY  09/30/2019  . OVARY SURGERY    . SKIN GRAFT Right 2018   RT foot. foot has been rebuilt.  it is full of metal  . TENOTOMY ACHILLES TENDON Right    Percuntaneous. metal in foot  . TONSILLECTOMY    . WOUND DEBRIDEMENT Left 07/23/2018   Procedure: DEBRIDEMENT WOUND;  Surgeon: Algernon Huxley, MD;  Location: [facility];  Service: Vascular;  Laterality: Left;    Prior to Admission medications   Medication Sig Start Date End Date Taking? Authorizing Provider  atorvastatin (LIPITOR) 10 MG tablet Take 1 tablet (10 mg total) by mouth at bedtime. 08/24/19  Yes [name], PA-C  Cholecalciferol (VITAMIN D3) 250 MCG (10000 UT) TABS Take 10,000 Units by mouth daily.   Yes [provider]  clopidogrel (PLAVIX) 75 MG tablet Take 1 tablet (75 mg total) by mouth daily. 01/11/19  Yes Gollan, Kathlene November, MD  fluticasone (FLONASE) 50 MCG/ACT nasal spray Place 2 sprays into both nostrils daily as needed. Patient taking differently: Place 2 sprays into both nostrils daily as needed for allergies.  08/24/19  Yes [name], PA-C  gabapentin (NEURONTIN) 400 MG capsule Take 1 capsule (400 mg total) by mouth 2 (two) times daily. 08/24/19  Yes [name], PA-C  Krill Oil 1000 MG CAPS  Take 1,000 mg by mouth daily at 6 (six) AM.    Yes [provider]  lisinopril (ZESTRIL) 10 MG tablet Take 1 tablet (10 mg total) by mouth daily. 08/24/19  Yes [name], PA-C  Multiple Vitamins-Minerals (MULTIVITAMIN ADULTS 50+) TABS Take 1 tablet by mouth daily. 11/02/18  Yes Poulose, Bethel Born, NP  pantoprazole (PROTONIX) 20 MG tablet Take 1 tablet (20 mg total) by mouth daily. 08/24/19  Yes [name], PA-C  sertraline (ZOLOFT) 100 MG tablet TAKE 1 AND 1/2 TABLETS (150 MG TOTAL) BY MOUTH DAILY Patient taking differently: Take 100 mg by mouth daily.  08/24/19  Yes [name], PA-C  terbinafine (LAMISIL) 250 MG tablet Take 1 tablet (250 mg total) by mouth daily. 12/21/19  Yes Edrick Kins, DPM  vitamin C (ASCORBIC ACID) 500 MG tablet Take 1,000 mg by mouth daily.    Yes [provider]  Acetaminophen 500 MG capsule Take 1,500 mg by mouth every 6 (six) hours as needed for pain.     [provider]  albuterol (VENTOLIN HFA) 108 (90 Base) MCG/ACT inhaler Inhale 2 puffs into the lungs every 6 (six) hours as needed for wheezing or shortness of breath. 03/11/19   [name], PA-C  ipratropium-albuterol (DUONEB) 0.5-2.5 (3) MG/3ML SOLN Take 3 mLs by nebulization 3 (three) times daily as needed. Patient taking differently: Take 3 mLs by nebulization 3 (three) times daily as needed (Shortness of breath).  05/24/19   [name], PA-C  montelukast (SINGULAIR) 10 MG tablet Take 1 tablet (10 mg total) by mouth at bedtime. Patient not taking: Reported on 11/11/2019 08/24/19   [name], PA-C  Respiratory Therapy Supplies (NEBULIZER/TUBING/MOUTHPIECE) KIT Disp one nebulizer machine, tubing set and mouthpiece kit 05/24/19   [name], PA-C  umeclidinium-vilanterol (ANORO ELLIPTA) 62.5-25 MCG/INH AEPB Inhale 1 puff into the lungs daily. Patient not taking: Reported on 11/30/2019 08/24/19   [name], PA-C    Family History  Problem Relation Age of Onset  . Asthma Mother    . COPD Mother   . Arthritis Father   . Depression Father   . Heart disease Father   . Hypertension Father   . Stroke Father   . Heart attack Father   . Arthritis Brother   . Depression Brother   . Diabetes Brother   . Heart disease Brother   . Hyperlipidemia Brother   . Hypertension Brother   . Stroke Brother   . Vision loss Brother   . Heart attack Brother   . Diabetes Maternal Grandmother   . Thyroid disease Daughter   . Colitis Daughter   . Breast cancer Neg Hx      Social History   Tobacco Use  . Smoking status: Current Some Day Smoker    Packs/day: 0.50    Years: 55.00    Pack years: 27.50    Types: Cigarettes    Start date: 07/01/1960    Last attempt to quit: 11/22/2018    Years since quitting: 1.0  . Smokeless tobacco: Never Used  . Tobacco comment: trying to quit again, using chantix  Vaping Use  . Vaping Use: Never used  Substance Use Topics  . Alcohol use: Yes    Alcohol/week: 0.0 standard drinks    Comment: 2 drinks per month  . Drug use: Not Currently    Types: Marijuana    Comment: used for pain    Allergies as of 12/26/2019 - Review Complete 12/26/2019  Allergen Reaction Noted  . Meloxicam Other (See Comments) 12/12/2015  . Nsaids Other (See Comments) 06/29/2018  . Sitagliptin Hives and Itching 03/16/2015    Review of Systems:    All systems reviewed and negative except where noted in HPI.   Physical Exam:  Vital signs in last 24 hours: Vitals:   12/27/19 0523 12/27/19 0733 12/27/19 1550 12/27/19 1551  BP: (!) 126/52 (!) 119/44 (!) 155/44 (!) 136/43  Pulse: 64 63 65 63  Resp: _0 Temp: 97.7 F (36.5 C) (!) 97.3 F (36.3 C) 98 F (36.7 C)   TempSrc: Oral Oral Oral   SpO2: 98% 98% 98%   Weight:  Height:       Last BM Date: 12/26/19 General:   Pleasant, cooperative in NAD Head:  Normocephalic and atraumatic. Eyes:   No icterus.   Conjunctiva pink. PERRLA. Ears:  Normal auditory acuity. Neck:  Supple; no masses or  thyroidomegaly Lungs: Respirations even and unlabored. Lungs clear to auscultation bilaterally.   No wheezes, crackles, or rhonchi.  Abdomen:  Soft, nondistended, nontender. Normal bowel sounds. No appreciable masses or hepatomegaly.  No rebound or guarding.  Neurologic:  Alert and oriented x3;  grossly normal neurologically. Skin:  Intact without significant lesions or rashes. Cervical Nodes:  No significant cervical adenopathy. Psych:  Alert and cooperative. Normal affect.  LAB RESULTS: Recent Labs    12/26/19 1112 12/27/19 0521  WBC 16.3*  --   HGB 11.2* 9.9*  HCT 32.3* 29.1*  PLT 172  --    BMET Recent Labs    12/26/19 1112 12/27/19 0521  NA 124* 130*  K 4.4 5.0  CL 92* 99  CO2 23 24  GLUCOSE 129* 121*  BUN 18 18  CREATININE 0.90 1.02*  CALCIUM 8.7* 8.1*   LFT Recent Labs    12/26/19 1112  PROT 6.2*  ALBUMIN 3.6  AST 15  ALT 11  ALKPHOS 70  BILITOT 0.7   PT/INR No results for input(s): LABPROT, INR in the last 72 hours.  STUDIES: CT ABDOMEN PELVIS W CONTRAST  Result Date: 12/26/2019 CLINICAL DATA:  Acute abdominal pain, LEFT lower quadrant tenderness, leukocytosis, bright red blood per rectum; history diabetes mellitus, hypertension, smoker coronary artery disease EXAM: CT ABDOMEN AND PELVIS WITH CONTRAST TECHNIQUE: Multidetector CT imaging of the abdomen and pelvis was performed using the standard protocol following bolus administration of intravenous contrast. Sagittal and coronal MPR images reconstructed from axial data set. CONTRAST:  28m OMNIPAQUE IOHEXOL 300 MG/ML SOLN IV. No oral contrast. COMPARISON:  None FINDINGS: Lower chest: Lung bases clear Hepatobiliary: Gallbladder surgically absent. Low-attenuation focus LEFT lobe liver 8 mm greatest size likely tiny cyst. Remainder of liver unremarkable Pancreas: Atrophic pancreas without mass Spleen: Normal appearance Adrenals/Urinary Tract: Adrenal glands normal appearance. Tiny nonobstructing LEFT renal  calculus. Kidneys, ureters, and bladder otherwise normal appearance. Stomach/Bowel: Appendix not visualized. Wall thickening of the descending and proximal sigmoid colon consistent with colitis. Associated infiltration of surrounding mesocolon. No colonic diverticula identified. No evidence of perforation or abscess. Prior bariatric surgery. Stomach and bowel loops otherwise unremarkable. Vascular/Lymphatic: Atherosclerotic calcifications aorta, visceral arteries, iliac arteries, coronary arteries. Aorta normal caliber. No adenopathy. Multiple pelvic phleboliths. Reproductive: Uterus surgically absent. Nonvisualization of ovaries. Other: No free air or free fluid. Musculoskeletal: Multilevel degenerative disc and facet disease changes lumbar spine. IMPRESSION: Colitis involving the descending and proximal sigmoid colon, question infection versus inflammatory bowel disease, ischemia considered less likely. No evidence of perforation or abscess. Tiny nonobstructing LEFT renal calculus. Aortic Atherosclerosis (ICD10-I70.0). Electronically Signed   By: MLavonia DanaM.D.   On: 12/26/2019 15:33      Impression / Plan:   DAZARIE CORIZis a 74y.o. y/o female with bright red blood per rectum associated with loose stool  Given recent antibiotic use, and bloody diarrhea, with CT showing thickening of the descending and sigmoid colon, would rule out infectious causes   Please obtain C. difficile and GI stool panel  Lower risk of malignancy or IBD given no previous history of chronic symptoms, and recent colonoscopy in 2019  Internal hemorrhoids could also lead to her rectal bleeding  Has not had any active  bleeding today Patient is hemodynamically stable No indication for urgent endoscopy  As per guidelines, Plavix should be held for 3 to 5 days prior to colonoscopy  Will await infectious stool testing prior to proceeding with procedures,   If active bleeding occurs, prior to the above timeline,  please obtain RBC scan  Earlier procedures can be considered if needed depending on clinical status  No signs of upper GI bleeding present    Thank you for involving me in the care of this patient.      LOS: 1 day   Virgel Manifold, MD  12/27/2019, 4:13 PM

## 2019-12-27 NOTE — Plan of Care (Signed)
  Problem: Health Behavior/Discharge Planning: Goal: Ability to manage health-related needs will improve Outcome: Progressing   

## 2019-12-27 NOTE — Progress Notes (Signed)
PROGRESS NOTE    JONITA HIROTA  FFM:[id number] DOB: Mar 17, 1946 DOA: 12/26/2019 PCP: Danelle Berry, PA-C    Brief Narrative:  Andrea Holden is a 74 y.o. female with medical history significant for diabetes mellitus, aortic stenosis, depression and anemia who presents to the ER for evaluation of rectal bleeding for the last 2 days.  Patient states that her symptoms initially started with nausea, vomiting and diarrhea and then she developed lower abdominal cramping mostly in the left lower quadrant with loose bloody stools. She states that she has had about 5 episodes. She denies any recent antibiotic use and has not had any fever or chills Rectal exam per nurse practitioner in the ER showed bright red blood. She had a CT scan of abdomen and pelvis done which showed colitis involving the descending and proximal sigmoid colon, question infection versus inflammatory bowel disease, ischemia considered less likely. No evidence of perforation or abscess. Tiny nonobstructing LEFT renal calculus.    Consultants:   GI  Procedures:   Antimicrobials:   Zosyn and metronidazole   Subjective: Had diarrhea with blood earlier. No sob, dizziness, LUQ abd pain, no vomiting  Objective: Vitals:   12/26/19 2125 12/27/19 0147 12/27/19 0523 12/27/19 0733  BP: (!) 169/46 (!) 118/41 (!) 126/52 (!) 119/44  Pulse: 63 66 64 63  Resp: 17 17 16 16   Temp: 97.7 F (36.5 C) 97.8 F (36.6 C) 97.7 F (36.5 C) (!) 97.3 F (36.3 C)  TempSrc: Oral Oral Oral Oral  SpO2: 100% 95% 98% 98%  Weight:      Height:        Intake/Output Summary (Last 24 hours) at 12/27/2019 1543 Last data filed at 12/27/2019 0300 Gross per 24 hour  Intake 2157.52 ml  Output --  Net 2157.52 ml   Filed Weights   12/26/19 1111  Weight: 83.9 kg    Examination:  General exam: Appears calm and comfortable , nad, pale Respiratory system: Clear to auscultation. Respiratory effort normal. Cardiovascular system: S1 & S2  heard, RRR. No JVD, murmurs, rubs, gallops or clicks. No pedal edema. Gastrointestinal system: Abdomen is nondistended, soft and mild ttp at LUQ.12/28/19 Normal bowel sounds heard. Central nervous system: Alert and oriented.  Grossly intact strength Extremities: No edema Skin: Warm dry Psychiatry: Judgement and insight appear normal. Mood & affect appropriate.     Data Reviewed: I have personally reviewed following labs and imaging studies  CBC: Recent Labs  Lab 12/26/19 1112 12/27/19 0521  WBC 16.3*  --   HGB 11.2* 9.9*  HCT 32.3* 29.1*  MCV 79.8*  --   PLT 172  --    Basic Metabolic Panel: Recent Labs  Lab 12/26/19 1112 12/27/19 0521  NA 124* 130*  K 4.4 5.0  CL 92* 99  CO2 23 24  GLUCOSE 129* 121*  BUN 18 18  CREATININE 0.90 1.02*  CALCIUM 8.7* 8.1*   GFR: Estimated Creatinine Clearance: 49 mL/min (A) (by C-G formula based on SCr of 1.02 mg/dL (H)). Liver Function Tests: Recent Labs  Lab 12/26/19 1112  AST 15  ALT 11  ALKPHOS 70  BILITOT 0.7  PROT 6.2*  ALBUMIN 3.6   No results for input(s): LIPASE, AMYLASE in the last 168 hours. No results for input(s): AMMONIA in the last 168 hours. Coagulation Profile: No results for input(s): INR, PROTIME in the last 168 hours. Cardiac Enzymes: No results for input(s): CKTOTAL, CKMB, CKMBINDEX, TROPONINI in the last 168 hours. BNP (last 3 results) No results  for input(s): PROBNP in the last 8760 hours. HbA1C: No results for input(s): HGBA1C in the last 72 hours. CBG: Recent Labs  Lab 12/26/19 2122 12/27/19 0730 12/27/19 1136  GLUCAP 83 106* 175*   Lipid Profile: No results for input(s): CHOL, HDL, LDLCALC, TRIG, CHOLHDL, LDLDIRECT in the last 72 hours. Thyroid Function Tests: No results for input(s): TSH, T4TOTAL, FREET4, T3FREE, THYROIDAB in the last 72 hours. Anemia Panel: No results for input(s): VITAMINB12, FOLATE, FERRITIN, TIBC, IRON, RETICCTPCT in the last 72 hours. Sepsis Labs: No results for input(s):  PROCALCITON, LATICACIDVEN in the last 168 hours.  Recent Results (from the past 240 hour(s))  SARS Coronavirus 2 by RT PCR (hospital order, performed in [facility] hospital lab) Nasopharyngeal Nasopharyngeal Swab     Status: None   Collection Time: 12/26/19  2:11 PM   Specimen: Nasopharyngeal Swab  Result Value Ref Range Status   SARS Coronavirus 2 NEGATIVE NEGATIVE Final    Comment: (NOTE) SARS-CoV-2 target nucleic acids are NOT DETECTED.  The SARS-CoV-2 RNA is generally detectable in upper and lower respiratory specimens during the acute phase of infection. The lowest concentration of SARS-CoV-2 viral copies this assay can detect is 250 copies / mL. A negative result does not preclude SARS-CoV-2 infection and should not be used as the sole basis for treatment or other patient management decisions.  A negative result may occur with improper specimen collection / handling, submission of specimen other than nasopharyngeal swab, presence of viral mutation(s) within the areas targeted by this assay, and inadequate number of viral copies (<250 copies / mL). A negative result must be combined with clinical observations, patient history, and epidemiological information.  Fact Sheet for Patients:   [employer]cy  Fact Sheet for Healthcare Providers: https://pope.com/  This test is not yet approved or  cleared by the [place] FDA and has been authorized for detection and/or diagnosis of SARS-CoV-2 by FDA under an Emergency Use Authorization (EUA).  This EUA will remain in effect (meaning this test can be used) for the duration of the COVID-19 declaration under Section 564(b)(1) of the Act, 21 U.S.C. section 360bbb-3(b)(1), unless the authorization is terminated or revoked sooner.  Performed at [facility], [address], [place], [place] [address]          Radiology Studies: CT ABDOMEN PELVIS W  CONTRAST  Result Date: 12/26/2019 CLINICAL DATA:  Acute abdominal pain, LEFT lower quadrant tenderness, leukocytosis, bright red blood per rectum; history diabetes mellitus, hypertension, smoker coronary artery disease EXAM: CT ABDOMEN AND PELVIS WITH CONTRAST TECHNIQUE: Multidetector CT imaging of the abdomen and pelvis was performed using the standard protocol following bolus administration of intravenous contrast. Sagittal and coronal MPR images reconstructed from axial data set. CONTRAST:  24mL OMNIPAQUE IOHEXOL 300 MG/ML SOLN IV. No oral contrast. COMPARISON:  None FINDINGS: Lower chest: Lung bases clear Hepatobiliary: Gallbladder surgically absent. Low-attenuation focus LEFT lobe liver 8 mm greatest size likely tiny cyst. Remainder of liver unremarkable Pancreas: Atrophic pancreas without mass Spleen: Normal appearance Adrenals/Urinary Tract: Adrenal glands normal appearance. Tiny nonobstructing LEFT renal calculus. Kidneys, ureters, and bladder otherwise normal appearance. Stomach/Bowel: Appendix not visualized. Wall thickening of the descending and proximal sigmoid colon consistent with colitis. Associated infiltration of surrounding mesocolon. No colonic diverticula identified. No evidence of perforation or abscess. Prior bariatric surgery. Stomach and bowel loops otherwise unremarkable. Vascular/Lymphatic: Atherosclerotic calcifications aorta, visceral arteries, iliac arteries, coronary arteries. Aorta normal caliber. No adenopathy. Multiple pelvic phleboliths. Reproductive: Uterus surgically absent. Nonvisualization of ovaries.  Other: No free air or free fluid. Musculoskeletal: Multilevel degenerative disc and facet disease changes lumbar spine. IMPRESSION: Colitis involving the descending and proximal sigmoid colon, question infection versus inflammatory bowel disease, ischemia considered less likely. No evidence of perforation or abscess. Tiny nonobstructing LEFT renal calculus. Aortic Atherosclerosis  (ICD10-I70.0). Electronically Signed   By: Ulyses Southward M.D.   On: 12/26/2019 15:33        Scheduled Meds: . vitamin C  1,000 mg Oral Daily  . atorvastatin  10 mg Oral QHS  . cholecalciferol  10,000 Units Oral Daily  . gabapentin  400 mg Oral BID  . insulin aspart  0-15 Units Subcutaneous TID WC  . montelukast  10 mg Oral QHS  . multivitamin with minerals  1 tablet Oral Daily  . nicotine  21 mg Transdermal Daily  . pantoprazole (PROTONIX) IV  40 mg Intravenous Q24H  . sertraline  100 mg Oral Daily  . terbinafine  250 mg Oral Daily  . umeclidinium-vilanterol  1 puff Inhalation Daily   Continuous Infusions: . sodium chloride 100 mL/hr at 12/26/19 1843  . piperacillin-tazobactam (ZOSYN)  IV 3.375 g (12/27/19 0458)    Assessment & Plan:   Principal Problem:   Colitis Active Problems:   Hx of smoking   Diabetes mellitus without complication (HCC)   Hyponatremia   Hypertension with heart disease   Nicotine dependence   Rectal bleeding   Acute Colitis Patient presents for evaluation of left lower quadrant pain as well as rectal bleed which she has had for about 2 days She denies having any fever or chills CT scan of abdomen shows colitis involving the descending and proximal sigmoid colon, question infection versus inflammatory bowel disease, ischemia considered less likely. Patient has a leukocytosis which may be related to the rectal bleeding but is afebrile She received IV antibiotics (Ciprofloxacin/Flagyl)  in the emergency room Continue with metronidazole and Zosyn adjusted to renal function Serial H/H GIs input pending   Hyponatremia Most likely hypovolemic related to poor oral intake May also be related to SSRI use, patient is on Zoloft Most recent sodium level done June 1 was 137 Has improved with today sodium 130  Continue gentle IV fluid hydration Monitor levels   Rectal bleeding Most likely secondary to colitis Hemoglobin is currently stable at  11g/dl Monitor H&H closely We will request GI consult pending   Diabetes mellitus Place patient on a clear liquid diet Sliding scale coverage with insulin   Depression  Continue Zoloft   Hypertension Hold lisinopril due to rectal bleeding   COPD Not in acute exacerbation Continue as needed bronchodilator therapy as well as inhaled steroids   Nicotine Dependence Smoking cessation was discussed with patient in detail Place patient on nicotine transdermal patch 21mg   DVT prophylaxis: SCD Code Status: Full Family Communication:  None at bedside  disposition Plan: Back to previous home environment  Remains inpatient appropriate because:IV treatments appropriate due to intensity of illness or inability to take PO   Dispo: The patient is from: Home              Anticipated d/c is to: Home              Anticipated d/c date is: 2 days              Patient currently is not medically stable to d/c.            LOS: 1 day   Time spent: 45 minutes with more  than 50 minutes on [place], MD [facility] Pager [phone]  If 7PM-7AM, please contact night-coverage [email or web address] Password [facility] 12/27/2019, 3:43 PM

## 2019-12-28 ENCOUNTER — Telehealth: Payer: Self-pay | Admitting: *Deleted

## 2019-12-28 ENCOUNTER — Telehealth: Payer: Self-pay

## 2019-12-28 DIAGNOSIS — K529 Noninfective gastroenteritis and colitis, unspecified: Secondary | ICD-10-CM

## 2019-12-28 LAB — HEMOGLOBIN AND HEMATOCRIT, BLOOD
HCT: 29.3 % — ABNORMAL LOW (ref 36.0–46.0)
Hemoglobin: 10 g/dL — ABNORMAL LOW (ref 12.0–15.0)

## 2019-12-28 LAB — CBC
HCT: 30.1 % — ABNORMAL LOW (ref 36.0–46.0)
Hemoglobin: 9.9 g/dL — ABNORMAL LOW (ref 12.0–15.0)
MCH: 27.6 pg (ref 26.0–34.0)
MCHC: 32.9 g/dL (ref 30.0–36.0)
MCV: 83.8 fL (ref 80.0–100.0)
Platelets: 154 10*3/uL (ref 150–400)
RBC: 3.59 MIL/uL — ABNORMAL LOW (ref 3.87–5.11)
RDW: 13.2 % (ref 11.5–15.5)
WBC: 7.4 10*3/uL (ref 4.0–10.5)
nRBC: 0 % (ref 0.0–0.2)

## 2019-12-28 LAB — BASIC METABOLIC PANEL
Anion gap: 5 (ref 5–15)
BUN: 16 mg/dL (ref 8–23)
CO2: 25 mmol/L (ref 22–32)
Calcium: 8.5 mg/dL — ABNORMAL LOW (ref 8.9–10.3)
Chloride: 102 mmol/L (ref 98–111)
Creatinine, Ser: 1.02 mg/dL — ABNORMAL HIGH (ref 0.44–1.00)
GFR calc Af Amer: >60 mL/min (ref >60)
GFR calc non Af Amer: 55 mL/min — ABNORMAL LOW (ref >60)
Glucose, Bld: 146 mg/dL — ABNORMAL HIGH (ref 70–99)
Potassium: 4.7 mmol/L (ref 3.5–5.1)
Sodium: 132 mmol/L — ABNORMAL LOW (ref 135–145)

## 2019-12-28 LAB — GLUCOSE, CAPILLARY
Glucose-Capillary: 136 mg/dL — ABNORMAL HIGH (ref 70–99)
Glucose-Capillary: 114 mg/dL — ABNORMAL HIGH (ref 70–99)
Glucose-Capillary: 90 mg/dL (ref 70–99)

## 2019-12-28 MED ORDER — BISACODYL 5 MG PO TBEC
10.0000 mg | DELAYED_RELEASE_TABLET | Freq: Once | ORAL | Status: AC
  Administered 2019-12-28: 10 mg via ORAL
  Filled 2019-12-28: qty 2

## 2019-12-28 MED ORDER — PEG 3350-KCL-NA BICARB-NACL 420 G PO SOLR
4000.0000 mL | Freq: Once | ORAL | Status: AC
  Administered 2019-12-28: 4000 mL via ORAL
  Filled 2019-12-28: qty 4000

## 2019-12-28 NOTE — Progress Notes (Signed)
PROGRESS NOTE    SHANZAY HEPWORTH  ZOX:[id number] DOB: 02/12/1946 DOA: 12/26/2019 PCP: Danelle Berry, PA-C    Brief Narrative:  Andrea Holden is a 74 y.o. female with medical history significant for diabetes mellitus, aortic stenosis, depression and anemia who presents to the ER for evaluation of rectal bleeding for the last 2 days.  Patient states that her symptoms initially started with nausea, vomiting and diarrhea and then she developed lower abdominal cramping mostly in the left lower quadrant with loose bloody stools. She states that she has had about 5 episodes. She denies any recent antibiotic use and has not had any fever or chills Rectal exam per nurse practitioner in the ER showed bright red blood. She had a CT scan of abdomen and pelvis done which showed colitis involving the descending and proximal sigmoid colon, question infection versus inflammatory bowel disease, ischemia considered less likely. No evidence of perforation or abscess. Tiny nonobstructing LEFT renal calculus.    Consultants:   GI  Procedures:   Antimicrobials:   Zosyn    Subjective: No complaints diarrhea since yesterday when replaced collection for stool.  Denies abdominal pain.  No nausea or vomiting.  Patient is very irritated that she has not seen any doctors at all yesterday or today.  I explained to her that I was in the room speaking to her yesterday with her nurse present which she verified.  Then she reported she remembers me but she was unhappy with how I did not explain anything to her.  I explained to the patient that I did answer all her questions that she had yesterday. Then she was upset she hasnt seen GI doctor and upset why her diet kept changing. I explained to her that initially she was n.p.o. because the GI doctor had to assess her just in case she needed any kind of scoping and also allow the inflammation to settle down.  Her diet change because GI did see her oriented stated she  could take p.o. intake.  Overall no matter how we explained to the patient today she seems very irritable and at times confused.  Objective: Vitals:   12/27/19 1550 12/27/19 1551 12/27/19 2330 12/28/19 0753  BP: (!) 155/44 (!) 136/43 (!) 132/46 (!) 160/47  Pulse: 65 63 (!) 57 61  Resp: 16  20 17   Temp: 98 F (36.7 C)  98.1 F (36.7 C) 97.7 F (36.5 C)  TempSrc: Oral  Oral Oral  SpO2: 98%  99% 96%  Weight:      Height:        Intake/Output Summary (Last 24 hours) at 12/28/2019 0817 Last data filed at 12/27/2019 2244 Gross per 24 hour  Intake 1765.13 ml  Output --  Net 1765.13 ml   Filed Weights   12/26/19 1111  Weight: 83.9 kg    Examination:  General exam: Appears calm and comfortable , irritable. Unpleasant this am Respiratory system: Clear to auscultation. Respiratory effort normal.no w/r/r Cardiovascular system: S1 & S2 heard, RRR. No JVD, murmurs, rubs, gallops or clicks. No pedal edema. Gastrointestinal system: Abdomen is nondistended, soft and NT. Normal bowel sounds heard. Central nervous system: Alert and oriented.  Grossly intact Extremities: No edema, no cyanosis Skin: Warm dry Psychiatry: Judgement and insight appear normal. Mood & affect appropriate.     Data Reviewed: I have personally reviewed following labs and imaging studies  CBC: Recent Labs  Lab 12/26/19 1112 12/27/19 0521 12/27/19 1741 12/28/19 0527  WBC 16.3*  --   --  7.4  HGB 11.2* 9.9* 10.4* 9.9*  HCT 32.3* 29.1* 31.0* 30.1*  MCV 79.8*  --   --  83.8  PLT 172  --   --  154   Basic Metabolic Panel: Recent Labs  Lab 12/26/19 1112 12/27/19 0521 12/28/19 0527  NA 124* 130* 132*  K 4.4 5.0 4.7  CL 92* 99 102  CO2 23 24 25   GLUCOSE 129* 121* 146*  BUN 18 18 16   CREATININE 0.90 1.02* 1.02*  CALCIUM 8.7* 8.1* 8.5*   GFR: Estimated Creatinine Clearance: 49 mL/min (A) (by C-G formula based on SCr of 1.02 mg/dL (H)). Liver Function Tests: Recent Labs  Lab 12/26/19 1112  AST 15   ALT 11  ALKPHOS 70  BILITOT 0.7  PROT 6.2*  ALBUMIN 3.6   No results for input(s): LIPASE, AMYLASE in the last 168 hours. No results for input(s): AMMONIA in the last 168 hours. Coagulation Profile: No results for input(s): INR, PROTIME in the last 168 hours. Cardiac Enzymes: No results for input(s): CKTOTAL, CKMB, CKMBINDEX, TROPONINI in the last 168 hours. BNP (last 3 results) No results for input(s): PROBNP in the last 8760 hours. HbA1C: No results for input(s): HGBA1C in the last 72 hours. CBG: Recent Labs  Lab 12/26/19 2122 12/27/19 0730 12/27/19 1136 12/27/19 1555 12/28/19 0754  GLUCAP 83 106* 175* 102* 136*   Lipid Profile: No results for input(s): CHOL, HDL, LDLCALC, TRIG, CHOLHDL, LDLDIRECT in the last 72 hours. Thyroid Function Tests: No results for input(s): TSH, T4TOTAL, FREET4, T3FREE, THYROIDAB in the last 72 hours. Anemia Panel: No results for input(s): VITAMINB12, FOLATE, FERRITIN, TIBC, IRON, RETICCTPCT in the last 72 hours. Sepsis Labs: No results for input(s): PROCALCITON, LATICACIDVEN in the last 168 hours.  Recent Results (from the past 240 hour(s))  SARS Coronavirus 2 by RT PCR (hospital order, performed in [facility] hospital lab) Nasopharyngeal Nasopharyngeal Swab     Status: None   Collection Time: 12/26/19  2:11 PM   Specimen: Nasopharyngeal Swab  Result Value Ref Range Status   SARS Coronavirus 2 NEGATIVE NEGATIVE Final    Comment: (NOTE) SARS-CoV-2 target nucleic acids are NOT DETECTED.  The SARS-CoV-2 RNA is generally detectable in upper and lower respiratory specimens during the acute phase of infection. The lowest concentration of SARS-CoV-2 viral copies this assay can detect is 250 copies / mL. A negative result does not preclude SARS-CoV-2 infection and should not be used as the sole basis for treatment or other patient management decisions.  A negative result may occur with improper specimen collection / handling, submission of  specimen other than nasopharyngeal swab, presence of viral mutation(s) within the areas targeted by this assay, and inadequate number of viral copies (<250 copies / mL). A negative result must be combined with clinical observations, patient history, and epidemiological information.  Fact Sheet for Patients:   [facility]  Fact Sheet for Healthcare Providers: 12/28/19  This test is not yet approved or  cleared by the [employer] FDA and has been authorized for detection and/or diagnosis of SARS-CoV-2 by FDA under an Emergency Use Authorization (EUA).  This EUA will remain in effect (meaning this test can be used) for the duration of the COVID-19 declaration under Section 564(b)(1) of the Act, 21 U.S.C. section 360bbb-3(b)(1), unless the authorization is terminated or revoked sooner.  Performed at [facility], [address] [place], [address] [place]          Radiology Studies: CT ABDOMEN PELVIS W CONTRAST  Result Date: 12/26/2019 CLINICAL DATA:  Acute abdominal pain, LEFT lower quadrant tenderness, leukocytosis, bright red blood per rectum; history diabetes mellitus, hypertension, smoker coronary artery disease EXAM: CT ABDOMEN AND PELVIS WITH CONTRAST TECHNIQUE: Multidetector CT imaging of the abdomen and pelvis was performed using the standard protocol following bolus administration of intravenous contrast. Sagittal and coronal MPR images reconstructed from axial data set. CONTRAST:  75mL OMNIPAQUE IOHEXOL 300 MG/ML SOLN IV. No oral contrast. COMPARISON:  None FINDINGS: Lower chest: Lung bases clear Hepatobiliary: Gallbladder surgically absent. Low-attenuation focus LEFT lobe liver 8 mm greatest size likely tiny cyst. Remainder of liver unremarkable Pancreas: Atrophic pancreas without mass Spleen: Normal appearance Adrenals/Urinary Tract: Adrenal glands normal appearance. Tiny nonobstructing LEFT renal  calculus. Kidneys, ureters, and bladder otherwise normal appearance. Stomach/Bowel: Appendix not visualized. Wall thickening of the descending and proximal sigmoid colon consistent with colitis. Associated infiltration of surrounding mesocolon. No colonic diverticula identified. No evidence of perforation or abscess. Prior bariatric surgery. Stomach and bowel loops otherwise unremarkable. Vascular/Lymphatic: Atherosclerotic calcifications aorta, visceral arteries, iliac arteries, coronary arteries. Aorta normal caliber. No adenopathy. Multiple pelvic phleboliths. Reproductive: Uterus surgically absent. Nonvisualization of ovaries. Other: No free air or free fluid. Musculoskeletal: Multilevel degenerative disc and facet disease changes lumbar spine. IMPRESSION: Colitis involving the descending and proximal sigmoid colon, question infection versus inflammatory bowel disease, ischemia considered less likely. No evidence of perforation or abscess. Tiny nonobstructing LEFT renal calculus. Aortic Atherosclerosis (ICD10-I70.0). Electronically Signed   By: Ulyses SouthwardMark  Boles M.D.   On: 12/26/2019 15:33        Scheduled Meds: . vitamin C  1,000 mg Oral Daily  . atorvastatin  10 mg Oral QHS  . cholecalciferol  10,000 Units Oral Daily  . gabapentin  400 mg Oral BID  . insulin aspart  0-15 Units Subcutaneous TID WC  . montelukast  10 mg Oral QHS  . multivitamin with minerals  1 tablet Oral Daily  . nicotine  21 mg Transdermal Daily  . pantoprazole (PROTONIX) IV  40 mg Intravenous Q24H  . sertraline  100 mg Oral Daily  . terbinafine  250 mg Oral Daily  . umeclidinium-vilanterol  1 puff Inhalation Daily   Continuous Infusions: . sodium chloride 100 mL/hr at 12/27/19 2244  . piperacillin-tazobactam (ZOSYN)  IV 3.375 g (12/28/19 0558)    Assessment & Plan:   Principal Problem:   Colitis Active Problems:   Hx of smoking   Diabetes mellitus without complication (HCC)   Hyponatremia   Hypertension with heart  disease   Nicotine dependence   Rectal bleeding   Acute Colitis Patient presents for evaluation of left lower quadrant pain as well as rectal bleed which she has had for about 2 days She denies having any fever or chills CT scan of abdomen shows colitis involving the descending and proximal sigmoid colon, question infection versus inflammatory bowel disease, ischemia considered less likely. Patient has a leukocytosis which may be related to the rectal bleeding but is afebrile She received IV antibiotics (Ciprofloxacin/Flagyl)  in the emergency room Continue with metronidazole and Zosyn adjusted to renal function Serial H/H-not requiring transfusion yet GI following-As per guidelines, Plavix should be held for 3 to 5 days prior to colonoscopy If active bleeding occurs, prior to the above timeline, please obtain RBC scan Plan: cscope in am    Hyponatremia Most likely hypovolemic related to poor oral intake May also be related to SSRI use, patient is on Zoloft Most recent sodium level done June 1 was 137  Has improved with today sodium 132 Continue gentle IV fluid hydration, decrease rate to 88ml/hr Monitor levels   Rectal bleeding Most likely secondary to colitis Hemoglobin  11g/dl ...>9.9 today Monitor H&H closely Gi following   Diabetes mellitus Place patient on a clear liquid diet Sliding scale coverage with insulin   Depression  Continue Zoloft   Hypertension Hold lisinopril due to bleed, if bp elevates, can resume   COPD Not in acute exacerbation Continue as needed bronchodilator therapy as well as inhaled steroids   Nicotine Dependence Smoking cessation was discussed with patient in detail Place patient on nicotine transdermal patch 21mg   DVT prophylaxis: SCD Code Status: Full Family Communication:  None at bedside  disposition Plan: Back to previous home environment  Remains inpatient appropriate because:IV treatments appropriate due to  intensity of illness or inability to take PO, needs colonoscopy in am.    Dispo: The patient is from: Home              Anticipated d/c is to: Home              Anticipated d/c date is: 2 days              Patient currently is not medically stable to d/c.            LOS: 2 days   Time spent: 45 minutes with more than 50 minutes on COC    , MD [facility] Pager [phone]  If 7PM-7AM, please contact night-coverage [email or web address] Password [facility] 12/28/2019, 8:17 AM

## 2019-12-28 NOTE — Progress Notes (Signed)
Andrea Bouillon, MD [address], [place], [place], [address] [address], [address], [place], [place], [address] Phone: [phone]  Fax: [phone]   Subjective:  Patient has not had any blood per rectum in 2 days. She also has not had any stool output in 2 days and therefore it has not been able to be collected for stool testing  Objective: Exam: Vital signs in last 24 hours: Vitals:   12/27/19 1550 12/27/19 1551 12/27/19 2330 12/28/19 0753  BP: (!) 155/44 (!) 136/43 (!) 132/46 (!) 160/47  Pulse: 65 63 (!) 57 61  Resp: 16  20 17   Temp: 98 F (36.7 C)  98.1 F (36.7 C) 97.7 F (36.5 C)  TempSrc: Oral  Oral Oral  SpO2: 98%  99% 96%  Weight:      Height:       Weight change:   Intake/Output Summary (Last 24 hours) at 12/28/2019 1511 Last data filed at 12/27/2019 2244 Gross per 24 hour  Intake 1765.13 ml  Output -  Net 1765.13 ml    General: No acute distress, AAO x3 Abd: Soft, NT/ND, No HSM Skin: Warm, no rashes Neck: Supple, Trachea midline   Lab Results: Lab Results  Component Value Date   WBC 7.4 12/28/2019   HGB 9.9 (L) 12/28/2019   HCT 30.1 (L) 12/28/2019   MCV 83.8 12/28/2019   PLT 154 12/28/2019   Micro Results: Recent Results (from the past 240 hour(s))  SARS Coronavirus 2 by RT PCR (hospital order, performed in [facility] hospital lab) Nasopharyngeal Nasopharyngeal Swab     Status: None   Collection Time: 12/26/19  2:11 PM   Specimen: Nasopharyngeal Swab  Result Value Ref Range Status   SARS Coronavirus 2 NEGATIVE NEGATIVE Final    Comment: (NOTE) SARS-CoV-2 target nucleic acids are NOT DETECTED.  The SARS-CoV-2 RNA is generally detectable in upper and lower respiratory specimens during the acute phase of infection. The lowest concentration of SARS-CoV-2 viral copies this assay can detect is 250 copies / mL. A negative result does not preclude SARS-CoV-2 infection and should not be used as the sole basis for treatment or  other patient management decisions.  A negative result may occur with improper specimen collection / handling, submission of specimen other than nasopharyngeal swab, presence of viral mutation(s) within the areas targeted by this assay, and inadequate number of viral copies (<250 copies / mL). A negative result must be combined with clinical observations, patient history, and epidemiological information.  Fact Sheet for Patients:   12/28/19  Fact Sheet for Healthcare Providers: BoilerBrush.com.cy  This test is not yet approved or  cleared by the https://pope.com/ FDA and has been authorized for detection and/or diagnosis of SARS-CoV-2 by FDA under an Emergency Use Authorization (EUA).  This EUA will remain in effect (meaning this test can be used) for the duration of the COVID-19 declaration under Section 564(b)(1) of the Act, 21 U.S.C. section 360bbb-3(b)(1), unless the authorization is terminated or revoked sooner.  Performed at [facility], [address] [place], [place]    Studies/Results: CT ABDOMEN PELVIS W CONTRAST  Result Date: 12/26/2019 CLINICAL DATA:  Acute abdominal pain, LEFT lower quadrant tenderness, leukocytosis, bright red blood per rectum; history diabetes mellitus, hypertension, smoker coronary artery disease EXAM: CT ABDOMEN AND PELVIS WITH CONTRAST TECHNIQUE: Multidetector CT imaging of the abdomen and pelvis was performed using the standard protocol following bolus administration of intravenous contrast. Sagittal and coronal MPR images reconstructed from  axial data set. CONTRAST:  20mL OMNIPAQUE IOHEXOL 300 MG/ML SOLN IV. No oral contrast. COMPARISON:  None FINDINGS: Lower chest: Lung bases clear Hepatobiliary: Gallbladder surgically absent. Low-attenuation focus LEFT lobe liver 8 mm greatest size likely tiny cyst. Remainder of liver unremarkable Pancreas: Atrophic pancreas without mass  Spleen: Normal appearance Adrenals/Urinary Tract: Adrenal glands normal appearance. Tiny nonobstructing LEFT renal calculus. Kidneys, ureters, and bladder otherwise normal appearance. Stomach/Bowel: Appendix not visualized. Wall thickening of the descending and proximal sigmoid colon consistent with colitis. Associated infiltration of surrounding mesocolon. No colonic diverticula identified. No evidence of perforation or abscess. Prior bariatric surgery. Stomach and bowel loops otherwise unremarkable. Vascular/Lymphatic: Atherosclerotic calcifications aorta, visceral arteries, iliac arteries, coronary arteries. Aorta normal caliber. No adenopathy. Multiple pelvic phleboliths. Reproductive: Uterus surgically absent. Nonvisualization of ovaries. Other: No free air or free fluid. Musculoskeletal: Multilevel degenerative disc and facet disease changes lumbar spine. IMPRESSION: Colitis involving the descending and proximal sigmoid colon, question infection versus inflammatory bowel disease, ischemia considered less likely. No evidence of perforation or abscess. Tiny nonobstructing LEFT renal calculus. Aortic Atherosclerosis (ICD10-I70.0). Electronically Signed   By: Ulyses Southward M.D.   On: 12/26/2019 15:33   Medications:  Scheduled Meds: . vitamin C  1,000 mg Oral Daily  . atorvastatin  10 mg Oral QHS  . cholecalciferol  10,000 Units Oral Daily  . gabapentin  400 mg Oral BID  . insulin aspart  0-15 Units Subcutaneous TID WC  . montelukast  10 mg Oral QHS  . multivitamin with minerals  1 tablet Oral Daily  . nicotine  21 mg Transdermal Daily  . pantoprazole (PROTONIX) IV  40 mg Intravenous Q24H  . sertraline  100 mg Oral Daily  . terbinafine  250 mg Oral Daily  . umeclidinium-vilanterol  1 puff Inhalation Daily   Continuous Infusions: . sodium chloride 75 mL/hr at 12/28/19 1448  . piperacillin-tazobactam (ZOSYN)  IV 3.375 g (12/28/19 1342)   PRN Meds:.acetaminophen, fluticasone, ipratropium-albuterol,  ondansetron **OR** ondansetron (ZOFRAN) IV, oxyCODONE   Assessment: Principal Problem:   Colitis Active Problems:   Hx of smoking   Diabetes mellitus without complication (HCC)   Hyponatremia   Hypertension with heart disease   Nicotine dependence   Rectal bleeding    Plan: Proceed with colonoscopy tomorrow to evaluate etiology of bright red blood per rectum that has now resolved  Colonoscopy would help [place] evaluate risk of rebleeding when she is reinitiated on her Plavix, depending on what led to the bleeding in the first place  I have discussed alternative options, risks & benefits,  which include, but are not limited to, bleeding, infection, perforation,respiratory complication & drug reaction.  The patient agrees with this plan & written consent will be obtained.      LOS: 2 days   Andrea Bouillon, MD 12/28/2019, 3:11 PM

## 2019-12-28 NOTE — Progress Notes (Signed)
Shift Summary:  Patient had no acute events overnight. Patient refused nightime FSBS. Patient communicates that she is tired of getting pricked when she's worked very hard, over the years, to get her sugars under control. IVF continued as well as IV abx. Patient passing gas, however had not had any stools thus far during this shift. Will continue to monitor patient.

## 2019-12-28 NOTE — Telephone Encounter (Signed)
"  I saw Dr. Logan Bores day before yesterday.  We had a mix up about where to have my blood drawn.  My primary doctor is saying well if he wrote it, he's got to request where to send you.  But because he wrote it, they don't except that.  It's got to be you calling her or whatever.  Something like that.  He wants me to start on this Lamisil right away.  The thing I can tell him is that I had complete panel on July 1.  According to my doctors the numbers for my AST is 15, for my ALT is 10, totally within the limits with no problem.  I'm wondering if he has any problems with me starting the medication.  I need to get started on this.  Call me if you can today.  My pharmacy is closed after 12 pm tomorrow.  Please call me.

## 2019-12-28 NOTE — Plan of Care (Signed)
Received pt alert and oriented x4 verbal denies pain. Independent in room  continues IV ABTs, awaiting stool sample to be sent to r/o C-diff. enteric precautions in place. Pt on clear liquids as of this am per GI team. wheezes noted upon assessment. Emotional support and reassurance given to pt. Continue with current care plan staff will continue to monitor

## 2019-12-28 NOTE — Telephone Encounter (Signed)
Spoke with patient. She is okay to go ahead and take lamisil daily. - Dr. Logan Bores

## 2019-12-28 NOTE — Progress Notes (Signed)
Pt upset this AM about care. Pt states has not seen a MD since she has been here. Had to reorient patient that she saw the hospitalist and GI doctor on 12/27/19 as witnessed by Clinical research associate. States she had questions about her diet changes that were not explained. Writer restated the information to pt that provider had given her about why she could not eat due to her inflamed colon and the bowel needed to rest. Pt agitated and unreceptive to information given. During that time the Hospitalist walked in and pt expressed her concerns and the MD explained everything to the patient again as she did yesterday. Pt then stated she neve saw a GI MD. Writer explained to patient that GI Dr saw her yesterday and was the one who made the changes to her diet. Pt still disagrees that she saw anyone. Reassured patient that staff is monitoring her and doing all to take care of her needs. Will re-approach later in shift to give more emotional support and direction

## 2019-12-28 NOTE — Telephone Encounter (Signed)
Pt called states she has reached out several times and has no heard anything back about her getting the financial help with chantix?  Please advise?  She is currently in hospital and has been unable to smoke and states this is the perfect timing to quit and would like to know some answers asap.

## 2019-12-28 NOTE — Progress Notes (Signed)
Patient refusing FSBS check until about 6 or 7 am on 6/29 despite education and encouragement

## 2019-12-29 ENCOUNTER — Inpatient Hospital Stay: Payer: Medicare (Managed Care) | Admitting: Anesthesiology

## 2019-12-29 ENCOUNTER — Encounter
Admission: AD | Disposition: A | Discharge status: Home / Self Care | Payer: Self-pay | Source: Home / Self Care | Attending: Internal Medicine

## 2019-12-29 ENCOUNTER — Inpatient Hospital Stay: Payer: Medicare (Managed Care)

## 2019-12-29 ENCOUNTER — Encounter: Payer: Self-pay | Admitting: Internal Medicine

## 2019-12-29 HISTORY — PX: COLONOSCOPY: SHX5424

## 2019-12-29 LAB — GASTROINTESTINAL PANEL BY PCR, STOOL (REPLACES STOOL CULTURE)

## 2019-12-29 LAB — BASIC METABOLIC PANEL
Anion gap: 7 (ref 5–15)
BUN: 10 mg/dL (ref 8–23)
CO2: 24 mmol/L (ref 22–32)
Calcium: 8.3 mg/dL — ABNORMAL LOW (ref 8.9–10.3)
Chloride: 96 mmol/L — ABNORMAL LOW (ref 98–111)
Creatinine, Ser: 0.77 mg/dL (ref 0.44–1.00)
GFR calc Af Amer: >60 mL/min (ref >60)
GFR calc non Af Amer: >60 mL/min (ref >60)
Glucose, Bld: 97 mg/dL (ref 70–99)
Potassium: 4.4 mmol/L (ref 3.5–5.1)
Sodium: 127 mmol/L — ABNORMAL LOW (ref 135–145)

## 2019-12-29 LAB — HEMOGLOBIN AND HEMATOCRIT, BLOOD
HCT: 27.5 % — ABNORMAL LOW (ref 36.0–46.0)
Hemoglobin: 9.6 g/dL — ABNORMAL LOW (ref 12.0–15.0)

## 2019-12-29 LAB — GLUCOSE, CAPILLARY: Glucose-Capillary: 97 mg/dL (ref 70–99)

## 2019-12-29 SURGERY — COLONOSCOPY
Anesthesia: General

## 2019-12-29 MED ORDER — LIDOCAINE HCL (PF) 2 % IJ SOLN
INTRAMUSCULAR | Status: AC
  Filled 2019-12-29: qty 5

## 2019-12-29 MED ORDER — LIDOCAINE HCL (CARDIAC) PF 100 MG/5ML IV SOSY
PREFILLED_SYRINGE | INTRAVENOUS | Status: DC | PRN
  Administered 2019-12-29: 50 mg via INTRAVENOUS

## 2019-12-29 MED ORDER — PROPOFOL 500 MG/50ML IV EMUL
INTRAVENOUS | Status: DC | PRN
  Administered 2019-12-29: 120 ug/kg/min via INTRAVENOUS

## 2019-12-29 MED ORDER — PROPOFOL 500 MG/50ML IV EMUL
INTRAVENOUS | Status: AC
  Filled 2019-12-29: qty 50

## 2019-12-29 MED ORDER — SODIUM CHLORIDE 0.9 % IV SOLN: INTRAVENOUS | Status: DC

## 2019-12-29 MED ORDER — IOHEXOL 350 MG/ML SOLN
100.0000 mL | Freq: Once | INTRAVENOUS | Status: AC | PRN
  Administered 2019-12-29: 100 mL via INTRAVENOUS

## 2019-12-29 MED ORDER — PROPOFOL 10 MG/ML IV BOLUS
INTRAVENOUS | Status: DC | PRN
  Administered 2019-12-29: 70 mg via INTRAVENOUS

## 2019-12-29 NOTE — Anesthesia Preprocedure Evaluation (Signed)
Anesthesia Evaluation  Patient identified by MRN, date of birth, ID band Patient awake    Reviewed: Allergy & Precautions, H&P , NPO status , reviewed documented beta blocker date and time   History of Anesthesia Complications (+) history of anesthetic complications  Airway Mallampati: III  TM Distance: >3 FB Neck ROM: limited    Dental  (+) Partial Lower, Caps, Chipped, Missing   Pulmonary shortness of breath, asthma , pneumonia, COPD, neg recent URI, Current Smoker and Patient abstained from smoking.,     + decreased breath sounds      Cardiovascular hypertension, + angina + CAD and + Peripheral Vascular Disease  (-) Past MI and (-) Cardiac Stents Normal cardiovascular exam+ dysrhythmias + Valvular Problems/Murmurs AS   05/2018 ECHO Study Conclusions   - Left ventricle: The cavity size was normal. There was mild  concentric hypertrophy. Systolic function was normal. The  estimated ejection fraction was in the range of 60% to 65%. Wall  motion was normal; there were no regional wall motion  abnormalities. Doppler parameters are consistent with abnormal  left ventricular relaxation (grade 1 diastolic dysfunction).  - Aortic valve: Trileaflet; severely calcified leaflets. There was  mild stenosis. Peak velocity (S): 288 cm/s. Mean gradient (S): 17  mm Hg. Valve area (VTI): 1.31 cm^2.  - Left atrium: The atrium was mildly dilated.  - Right ventricle: Systolic function was normal.  - Pulmonary arteries: Systolic pressure was within the normal  range.    Neuro/Psych PSYCHIATRIC DISORDERS Anxiety Depression negative neurological ROS     GI/Hepatic Neg liver ROS, hiatal hernia, PUD, GERD  Controlled,  Endo/Other  diabetes  Renal/GU negative Renal ROS     Musculoskeletal  (+) Arthritis ,   Abdominal   Peds  Hematology  (+) Blood dyscrasia, anemia ,   Anesthesia Other Findings Past Medical History: No  date: Allergy     Comment:  seasonal allergies 2014: Anemia     Comment:  needed 5 units of blood d/t passing out, weak No date: Anxiety 12/24/2017: Aortic stenosis     Comment:  Echo Aug 2018 No date: Arthritis No date: Asthma     Comment:  allergy induced asthma 12/12/2017: Cardiac murmur No date: Cataract No date: Cataract     Comment:  left No date: Complication of anesthesia     Comment:  arrhythmia following colonoscopy No date: Degenerative disc disease, lumbar No date: Degenerative disc disease, lumbar No date: Depression No date: Diabetes mellitus 11/16/2015: Diabetic neuropathy (HCC) No date: Dysrhythmia     Comment:  stenosis of left ventricle No date: GERD (gastroesophageal reflux disease) No date: H/O transfusion     Comment:  patient was given 5 units of blood while at [facility],               blood type O+ No date: History of chicken pox No date: History of hiatal hernia No date: History of measles, mumps, or rubella No date: HOH (hard of hearing)     Comment:  does not use hearing aides yet No date: Hyperlipidemia No date: Hypertension No date: Irregular heartbeat 12/24/2017: LVH (left ventricular hypertrophy)     Comment:  Echo Aug 2018 No date: Neuropathy 11/16/2015: Opiate use 05/06/2018: Peripheral arterial disease (HCC)     Comment:  At rest, left > right; refer to vasc 11/16/2015: Pes planus of both feet 11/16/2015: Plantar fasciitis of right foot No date: Pneumonia No date: Wheezing  Past Surgical History: No date: ABDOMINAL HYSTERECTOMY 07/23/2018:  APPLICATION OF WOUND VAC; Left     Comment:  Procedure: APPLICATION OF WOUND VAC;  Surgeon: Annice Needy, MD;  Location: [facility] ORS;  Service: Vascular;                Laterality: Left; No date: banck injections 05/30/2015: CATARACT EXTRACTION W/PHACO; Right     Comment:  Procedure: CATARACT EXTRACTION PHACO AND INTRAOCULAR               LENS PLACEMENT (IOC);  Surgeon: Galen Manila, MD;                 Location: [facility] OR[facility];  Service: Ophthalmology;  Laterality:              Right;  [place] 00:57 AP% 20.9 CDE 11.99 fluid pack lot               #[id number] H 1970: CHOLECYSTECTOMY 2013: COLON SURGERY     Comment:  blocked colon 01/20/2018: COLONOSCOPY WITH PROPOFOL; N/A     Comment:  Procedure: COLONOSCOPY WITH PROPOFOL;  Surgeon: Midge Minium, MD;  Location: [facility] ENDOSCOPY;  Service:               Endoscopy;  Laterality: N/A; 07/08/2018: ENDARTERECTOMY FEMORAL; Left     Comment:  Procedure: ENDARTERECTOMY FEMORAL;  Surgeon: Annice Needy, MD;  Location: [facility] ORS;  Service: Vascular;                Laterality: Left; No date: EYE SURGERY; Right     Comment:  cataract surgery 2018: FOOT FUSION; Right     Comment:  metal in foot 1970: GALLBLADDER SURGERY 2010: GASTRIC BYPASS     Comment:  lost 178 lbs and regained 40 lbs last few years No date: HUMERUS FRACTURE SURGERY; Right     Comment:  metal plate with screws 2016: internal bleeding     Comment:  ulcer in past 07/08/2018: LOWER EXTREMITY ANGIOGRAM; Left     Comment:  Procedure: LOWER EXTREMITY ANGIOGRAM;  Surgeon: Annice Needy, MD;  Location: [facility] ORS;  Service: Vascular;                Laterality: Left; 05/27/2018: LOWER EXTREMITY ANGIOGRAPHY; Left     Comment:  Procedure: LOWER EXTREMITY ANGIOGRAPHY;  Surgeon: Annice Needy, MD;  Location: [facility] INVAS[facility] LAB;  Service:               Cardiovascular;  Laterality: Left; 12/07/2018: LOWER EXTREMITY ANGIOGRAPHY; Right     Comment:  Procedure: LOWER EXTREMITY ANGIOGRAPHY;  Surgeon: Annice Needy, MD;  Location: [facility];  Service:               Cardiovascular;  Laterality: Right; 05/10/2019: LOWER EXTREMITY ANGIOGRAPHY; Left     Comment:  Procedure: LOWER EXTREMITY ANGIOGRAPHY;  Surgeon: Annice Needy, MD;  Location: [facility];  Service:  Cardiovascular;   Laterality: Left; No date: MOUTH SURGERY     Comment:  root canals and crowns and extractions No date: OVARY SURGERY 2018: SKIN GRAFT; Right     Comment:  RT foot. foot has been rebuilt.  it is full of metal No date: TENOTOMY ACHILLES TENDON; Right     Comment:  Percuntaneous. metal in foot No date: TONSILLECTOMY 07/23/2018: WOUND DEBRIDEMENT; Left     Comment:  Procedure: DEBRIDEMENT WOUND;  Surgeon: Annice Needy,               MD;  Location: [facility] ORS;  Service: Vascular;  Laterality:              Left;  BMI    Body Mass Index: 36.05 kg/m      Reproductive/Obstetrics                             Anesthesia Physical  Anesthesia Plan  ASA: IV  Anesthesia Plan: General   Post-op Pain Management:    Induction: Intravenous  PONV Risk Score and Plan: 2 and Treatment may vary due to age or medical condition, TIVA and Propofol infusion  Airway Management Planned: Nasal Cannula and Natural Airway  Additional Equipment:   Intra-op Plan:   Post-operative Plan:   Informed Consent: I have reviewed the patients History and Physical, chart, labs and discussed the procedure including the risks, benefits and alternatives for the proposed anesthesia with the patient or authorized representative who has indicated his/her understanding and acceptance.     Dental Advisory Given  Plan Discussed with: CRNA  Anesthesia Plan Comments:         Anesthesia Quick Evaluation

## 2019-12-29 NOTE — Progress Notes (Signed)
Shift Summary:  Patient has had no acute events overnight. Patient communicated that she was only able to drink 1/2 of bowel prep (~2032mL), for possible colonoscopy today despite encouragement. Patient has had several bowel movements that are runny, but not yet clear. Patient has refused FSBS for this shift stating that she is no longer a diabetic and that she's worked to hard to get her sugars under control. Pt NPO since about MN. Will continue to monitor patient.

## 2019-12-29 NOTE — TOC Initial Note (Signed)
Transition of Care (TOC) - Initial/Assessment Note    Patient Details  Name: Andrea Holden MRN: [id number] Date of Birth: 1946-02-24  [facility] CM/SW Contact:    Su Hilt, RN Phone Number: 12/29/2019, 10:13 AM  Clinical Narrative:                  Met with the patient after receiving a consult to help with medication  The patient lives alone, she has two dogs, one large and one small,  She works part time as a Oceanographer and still drives She uses a cane and a rolator to get around and states that she gets around great and even goes to fairs She is working with a Cabin crew to help her find a new rental home due to the land lord selling the house she lives in and she has to move in Oct She is up to date with her PCP and gets her medications at [facility], she stated that she has trouble affording chantix and an inhaler that she uses, I asked her if she was working with the pharmacy for finding an alternate inhaler or possibly could go to the manufacturer website of chantix to get a discounted coupon,  She stated that she is working with the special care team thru her doctors office to get help with these things, they have been working on it for 2 weeks She is to get a colonoscopy today and will be happy to get lunch afterwards She has no needs at this time that are not already being worked on by a team outside of the hospital, I encouraged her to continue working with the special care team and the realtor Expected Discharge Plan: Home/Self Care Barriers to Discharge: Continued Medical Work up   Patient Goals and CMS Choice Patient states their goals for this hospitalization and ongoing recovery are:: get well      Expected Discharge Plan and Services Expected Discharge Plan: Home/Self Care   Discharge Planning Services: CM Consult   Living arrangements for the past 2 months: Single Family Home                 DME Arranged: N/A          HH Arranged: NA          Prior Living Arrangements/Services Living arrangements for the past 2 months: Single Family Home Lives with:: Self Patient language and need for interpreter reviewed:: Yes Do you feel safe going back to the place where you live?: Yes      Need for Family Participation in Patient Care: No (Comment) Care giver support system in place?: Yes (comment) Current home services: DME (cane and rollator) Criminal Activity/Legal Involvement Pertinent to Current Situation/Hospitalization: No - Comment as needed  Activities of Daily Living Home Assistive Devices/Equipment: Cane (specify quad or straight), Walker (specify type) ADL Screening (condition at time of admission) Patient's cognitive ability adequate to safely complete daily activities?: Yes Is the patient deaf or have difficulty hearing?: No Does the patient have difficulty seeing, even when wearing glasses/contacts?: No Does the patient have difficulty concentrating, remembering, or making decisions?: No Patient able to express need for assistance with ADLs?: Yes Does the patient have difficulty dressing or bathing?: No Independently performs ADLs?: Yes (appropriate for developmental age) Does the patient have difficulty walking or climbing stairs?: Yes Weakness of Legs: Both Weakness of Arms/Hands: Both  Permission Sought/Granted   Permission granted to share information with : Yes,  Verbal Permission Granted              Emotional Assessment Appearance:: Appears stated age Attitude/Demeanor/Rapport: Engaged Affect (typically observed): Appropriate Orientation: : Oriented to Situation, Oriented to  Time, Oriented to Place, Oriented to Self Alcohol / Substance Use: Not Applicable Psych Involvement: No (comment)  Admission diagnosis:  Chronic hyponatremia [E87.1] Colitis [K52.9] Acute lower GI bleeding [K92.2] Lower abdominal pain [R10.30] Patient Active Problem List   Diagnosis Date Noted   . Rectal bleeding 12/26/2019  . Colitis 12/26/2019  . Nicotine dependence 06/15/2019  . Osteopenia 03/03/2019  . Lower extremity edema 07/31/2018  . Pressure injury of skin 07/24/2018  . Surgical site infection 07/23/2018  . Wound of left leg 07/23/2018  . Atherosclerosis of artery of extremity with rest pain ([place]) 07/08/2018  . Shortness of breath 06/02/2018  . Bruit 06/02/2018  . Coronary artery disease of native artery of native heart with stable angina pectoris ([place]) 06/02/2018  . Atherosclerosis of native arteries of extremity with intermittent claudication ([place]) 05/19/2018  . Peripheral arterial disease ([place]) 05/06/2018  . Personal history of colonic polyps   . Aortic stenosis 12/24/2017  . LVH (left ventricular hypertrophy) 12/24/2017  . Hypertension with heart disease 12/24/2017  . Left atrial dilatation 12/12/2017  . Cardiac murmur 12/12/2017  . Gastrojejunal ulcer 12/04/2017  . Hyperphosphatemia 12/04/2017  . Spinal stenosis of lumbar region 06/18/2017  . Degeneration of lumbar intervertebral disc 06/02/2017  . Assistance needed with transportation 05/26/2017  . Financial difficulties 05/26/2017  . Needs assistance with community resources 05/26/2017  . Moderate recurrent major depression ([place]) 05/26/2017  . Non-healing wound of lower extremity 02/06/2017  . Status post ankle arthrodesis 12/11/2016  . Controlled substance agreement broken 10/11/2016  . Low back pain 09/25/2016  . Osteoarthritis of right subtalar joint 07/11/2016  . Posterior tibial tendinitis of right lower extremity 07/11/2016  . Breast cancer screening 06/18/2016  . Knee pain 04/03/2016  . Hyponatremia 03/01/2016  . High triglycerides 12/24/2015  . Pes planus of both feet 11/16/2015  . Plantar fasciitis of right foot 11/16/2015  . Heel spur 11/16/2015  . Diabetic neuropathy ([place]) 11/16/2015  . Right ankle pain 11/16/2015  . Medication monitoring encounter 11/16/2015  . Diabetes mellitus  without complication ([place]) 49/82/6415  . Chronic pain of multiple joints 08/15/2015  . Abnormal mammogram of right breast 06/19/2015  . Cerumen impaction 03/16/2015  . Hyperlipidemia 12/14/2014  . History of transfusion of packed RBC 12/14/2014  . Hx of smoking 12/14/2014  . Status post bariatric surgery 12/14/2014  . Morbid obesity ([place]) 12/14/2014  . Chronic radicular lumbar pain 12/14/2014  . History of GI bleed 11/12/2014  . History of small bowel obstruction 09/07/2014   PCP:  [name], PA-C Pharmacy:   [place], [place] [address] Phone: [phone] Fax: [phone]     Social Determinants of Health (SDOH) Interventions    Readmission Risk Interventions No flowsheet data found.

## 2019-12-29 NOTE — Transfer of Care (Signed)
Immediate Anesthesia Transfer of Care Note  Patient: Andrea Holden  Procedure(s) Performed: COLONOSCOPY (N/A )  Patient Location: Endoscopy Unit  Anesthesia Type:General  Level of Consciousness: drowsy and patient cooperative  Airway & Oxygen Therapy: Patient Spontanous Breathing and Patient connected to nasal cannula oxygen  Post-op Assessment: Report given to RN and Post -op Vital signs reviewed and stable  Post vital signs: Reviewed and stable  Last Vitals:  Vitals Value Taken Time  BP 98/41 12/29/19 1304  Temp 36.6 C 12/29/19 1304  Pulse 68 12/29/19 1305  Resp 15 12/29/19 1305  SpO2 97 % 12/29/19 1305  Vitals shown include unvalidated device data.  Last Pain:  Vitals:   12/29/19 1304  TempSrc: Temporal  PainSc: Asleep      Patients Stated Pain Goal: 0 (12/27/19 0200)  Complications: No complications documented.

## 2019-12-29 NOTE — Care Management Important Message (Signed)
Important Message  Patient Details  Name: Andrea Holden MRN: [id number] Date of Birth: Jun 11, 1946   Medicare Important Message Given:  Yes     Olegario Messier A Suzzette Gasparro 12/29/2019, 2:25 PM

## 2019-12-29 NOTE — Op Note (Signed)
[facility] Patient Name: Naiomy Watters Procedure Date: 12/29/2019 12:25 PM MRN: [id number] Account #: [id number] Date of Birth: 1946-03-14 Admit Type: Outpatient Age: 74 Room: [facility] ENDO ROOM 3 Gender: Female Note Status: Finalized Procedure:             Colonoscopy Indications:           Hematochezia Providers:             Vangie Henthorn B. Bonna Gains MD, MD Referring MD:          [name] (Referring MD) Medicines:             Monitored Anesthesia Care Complications:         No immediate complications. Procedure:             Pre-Anesthesia Assessment:                        - Prior to the procedure, a History and Physical was                         performed, and patient medications, allergies and                         sensitivities were reviewed. The patient's tolerance                         of previous anesthesia was reviewed.                        - The risks and benefits of the procedure and the                         sedation options and risks were discussed with the                         patient. All questions were answered and informed                         consent was obtained.                        - Patient identification and proposed procedure were                         verified prior to the procedure by the physician, the                         nurse, the anesthesiologist, the anesthetist and the                         technician. The procedure was verified in the                         pre-procedure area in the procedure room in the                         endoscopy suite.                        - Prophylactic Antibiotics: The patient does not  require prophylactic antibiotics.                        - ASA Grade Assessment: III - A patient with severe                         systemic disease.                        - After reviewing the risks and benefits, the patient                          was deemed in satisfactory condition to undergo the                         procedure.                        - Monitored anesthesia care was determined to be                         medically necessary for this procedure based on review                         of the patient's medical history, medications, and                         prior anesthesia history.                        - The anesthesia plan was to use monitored anesthesia                         care (MAC).                        After obtaining informed consent, the colonoscope was                         passed under direct vision. Throughout the procedure,                         the patient's blood pressure, pulse, and oxygen                         saturations were monitored continuously. The                         Colonoscope was introduced through the anus and                         advanced to the the cecum, identified by appendiceal                         orifice and ileocecal valve. The colonoscopy was                         performed with ease. The patient tolerated the  procedure well. The quality of the bowel preparation                         was poor. Findings:      A patchy area of moderately erythematous and ulcerated mucosa was found       in the proximal sigmoid colon, in the mid sigmoid colon and in the       descending colon. Biopsies were taken with a cold forceps for histology.      Multiple small-mouthed diverticula were found in the sigmoid colon.      The exam was otherwise without abnormality.      The retroflexed view of the distal rectum and anal verge was normal and       showed no anal or rectal abnormalities. Impression:            - Preparation of the colon was poor.                        - Erythematous and ulcerated mucosa in the proximal                         sigmoid colon, in the mid sigmoid colon and in the                         descending colon.  Biopsied.                        - The examination was otherwise normal.                        - Due to the prep, this was not a satisfactory exam                         for colorectal cancer screening, that is evaluation                         for small or flat lesions or polyps. Patient should                         follow up in clinic after discharge to discuss need                         for future colonoscopy and colorectal cancer screening. Recommendation:        - The pattern of inflammation is suspcious for                         ischemic colitis. Please obtain CTA or [place] mesenteric                         doppler for further evaluation.                        - Await pathology results.                        - Return patient to hospital ward for ongoing care.                        -  The findings and recommendations were discussed with                         the patient. Procedure Code(s):     --- Professional ---                        [phone], Colonoscopy, flexible; with biopsy, single or                         multiple Diagnosis Code(s):     --- Professional ---                        K63.89, Other specified diseases of intestine                        K63.3, Ulcer of intestine                        K92.1, Melena (includes Hematochezia) CPT copyright 2019 American Medical Association. All rights reserved. The codes documented in this report are preliminary and upon coder review may  be revised to meet current compliance requirements.  Vonda Antigua, MD Margretta Sidle B. Bonna Gains MD, MD 12/29/2019 1:11:15 PM This report has been signed electronically. Number of Addenda: 0 Note Initiated On: 12/29/2019 12:25 PM Scope Withdrawal Time: 0 hours 16 minutes 45 seconds  Total Procedure Duration: 0 hours 24 minutes 50 seconds  Estimated Blood Loss:  Estimated blood loss: none.      [facility]

## 2019-12-29 NOTE — Progress Notes (Signed)
PROGRESS NOTE    Andrea Holden  DVV:[id number] DOB: 1946/06/14 DOA: 12/26/2019 PCP: Danelle Berry, PA-C   Brief Narrative:  Andrea Denison Bromaghimis a 74 y.o.femalewith medical history significant fordiabetes mellitus, aortic stenosis, depression and anemia who presents to the ER for evaluation of rectal bleeding for the last 2 days. Patient states that her symptoms initially started with nausea, vomiting and diarrhea and then she developed lower abdominal cramping mostly in the left lower quadrant with loose bloody stools. She states that she has had about 5 episodes. She denies any recent antibiotic use and has not had any fever or chills Rectal exam per nurse practitioner in the ER showed bright red blood. She had a CT scan of abdomen and pelvis done whichshowed colitis involving the descending and proximal sigmoid colon, question infection versus inflammatory bowel disease, ischemia considered less likely. No evidence of perforation or abscess. Tiny nonobstructing LEFT renal calculus. GI pathogen negative.  She was started on Zosyn. GI was consulted and she was taken for colonoscopy today.  Subjective: Patient was seen before colonoscopy today.  No new complaints.  She did not had any bowel movement for the past 2 days.  Denies any more per rectum bleeding.  She was ready for colonoscopy and asking for a possible discharge after that.  She was worried about her animals at home as her neighbor is taking care of them. I talked with the patient on phone after her procedure and explained that we might need to do a CTA to look for any ischemia.  She seems understanding and would like to stay for final diagnosis and treatment.  Assessment & Plan:   Principal Problem:   Colitis Active Problems:   Hx of smoking   Diabetes mellitus without complication (HCC)   Hyponatremia   Hypertension with heart disease   Nicotine dependence   Rectal bleeding  Colitis.  Her symptoms and initial  imaging were concerning for colitis.  Patient underwent colonoscopy today which shows erythematous and ulceration of colon involving the proximal, mid sigmoid and descending colon.  Biopsies were taken.  Per GI note her appearance was concerning for ischemic colitis.  And they were recommending for CTA. -CTA was ordered which will be done later today as patient already ate after colonoscopy and she will need a 4-hour n.p.o. before imaging. -Disposition depends on CTA results. -Continue Zosyn for now-most likely will not need any antibiotics for discharge. -Continue holding Plavix in case she needs another procedure.  Rectal bleeding.  Most likely secondary to ischemic colitis.  Hemoglobin stable around 9.6.  No more active bleeding at this time. -Continue to monitor. -Transfuse if below 7.  Hyponatremia.  Resolved.  Most likely secondary to poor p.o. intake. Patient is also on SSRI which can be contributory. -Continue to monitor.  Type 2 diabetes mellitus.  Currently diet controlled. -Continue with SSI as needed.  Depression.  No acute concern. -Continue with home dose of Zoloft  COPD.  No acute exacerbation. -Continue as needed bronchodilators.  Nicotine Dependence. -Continue nicotine patch.  Objective: Vitals:   12/29/19 1304 12/29/19 1314 12/29/19 1324 12/29/19 1332  BP: (!) 98/41 (!) 118/36 (!) 139/52 (!) 155/47  Pulse: 62 62 62 (!) 58  Resp: 14 13 18 15   Temp: 97.8 F (36.6 C)     TempSrc: Temporal     SpO2: 98% 97% 99% 99%  Weight:      Height:        Intake/Output Summary (Last 24 hours) at  12/29/2019 1452 Last data filed at 12/29/2019 1300 Gross per 24 hour  Intake 5070.28 ml  Output --  Net 5070.28 ml   Filed Weights   12/26/19 1111  Weight: 83.9 kg    Examination:  General exam: Appears calm and comfortable  Respiratory system: Clear to auscultation. Respiratory effort normal. Cardiovascular system: S1 & S2 heard, RRR. No JVD, murmurs, Gastrointestinal  system: Soft, nontender, nondistended, bowel sounds positive. Central nervous system: Alert and oriented. No focal neurological deficits. Extremities: No edema, no cyanosis, pulses intact and symmetrical. Psychiatry: Judgement and insight appear normal. Mood & affect appropriate.    DVT prophylaxis: SCDs Code Status: Full Family Communication: Discussed with patient Disposition Plan:  Status is: Inpatient  Remains inpatient appropriate because:Inpatient level of care appropriate due to severity of illness   Dispo: The patient is from: Home              Anticipated d/c is to: Home              Anticipated d/c date is: 1 day              Patient currently is not medically stable to d/c.  Patient needs further investigation for a possible ischemic colitis.   Consultants:   GI  Procedures:  Colonoscopy  Antimicrobials:  Zosyn  Data Reviewed: I have personally reviewed following labs and imaging studies  CBC: Recent Labs  Lab 12/26/19 1112 12/26/19 1112 12/27/19 0521 12/27/19 1741 12/28/19 0527 12/28/19 1711 12/29/19 0451  WBC 16.3*  --   --   --  7.4  --   --   HGB 11.2*   < > 9.9* 10.4* 9.9* 10.0* 9.6*  HCT 32.3*   < > 29.1* 31.0* 30.1* 29.3* 27.5*  MCV 79.8*  --   --   --  83.8  --   --   PLT 172  --   --   --  154  --   --    < > = values in this interval not displayed.   Basic Metabolic Panel: Recent Labs  Lab 12/26/19 1112 12/27/19 0521 12/28/19 0527 12/29/19 0451  NA 124* 130* 132* 127*  K 4.4 5.0 4.7 4.4  CL 92* 99 102 96*  CO2 23 24 25 24   GLUCOSE 129* 121* 146* 97  BUN 18 18 16 10   CREATININE 0.90 1.02* 1.02* 0.77  CALCIUM 8.7* 8.1* 8.5* 8.3*   GFR: Estimated Creatinine Clearance: 62.4 mL/min (by C-G formula based on SCr of 0.77 mg/dL). Liver Function Tests: Recent Labs  Lab 12/26/19 1112  AST 15  ALT 11  ALKPHOS 70  BILITOT 0.7  PROT 6.2*  ALBUMIN 3.6   No results for input(s): LIPASE, AMYLASE in the last 168 hours. No results for  input(s): AMMONIA in the last 168 hours. Coagulation Profile: No results for input(s): INR, PROTIME in the last 168 hours. Cardiac Enzymes: No results for input(s): CKTOTAL, CKMB, CKMBINDEX, TROPONINI in the last 168 hours. BNP (last 3 results) No results for input(s): PROBNP in the last 8760 hours. HbA1C: No results for input(s): HGBA1C in the last 72 hours. CBG: Recent Labs  Lab 12/27/19 1555 12/28/19 0754 12/28/19 1225 12/28/19 1656 12/29/19 0810  GLUCAP 102* 136* 114* 90 97   Lipid Profile: No results for input(s): CHOL, HDL, LDLCALC, TRIG, CHOLHDL, LDLDIRECT in the last 72 hours. Thyroid Function Tests: No results for input(s): TSH, T4TOTAL, FREET4, T3FREE, THYROIDAB in the last 72 hours. Anemia Panel: No results for  input(s): VITAMINB12, FOLATE, FERRITIN, TIBC, IRON, RETICCTPCT in the last 72 hours. Sepsis Labs: No results for input(s): PROCALCITON, LATICACIDVEN in the last 168 hours.  Recent Results (from the past 240 hour(s))  SARS Coronavirus 2 by RT PCR (hospital order, performed in [facility] hospital lab) Nasopharyngeal Nasopharyngeal Swab     Status: None   Collection Time: 12/26/19  2:11 PM   Specimen: Nasopharyngeal Swab  Result Value Ref Range Status   SARS Coronavirus 2 NEGATIVE NEGATIVE Final    Comment: (NOTE) SARS-CoV-2 target nucleic acids are NOT DETECTED.  The SARS-CoV-2 RNA is generally detectable in upper and lower respiratory specimens during the acute phase of infection. The lowest concentration of SARS-CoV-2 viral copies this assay can detect is 250 copies / mL. A negative result does not preclude SARS-CoV-2 infection and should not be used as the sole basis for treatment or other patient management decisions.  A negative result may occur with improper specimen collection / handling, submission of specimen other than nasopharyngeal swab, presence of viral mutation(s) within the areas targeted by this assay, and inadequate number of viral  copies (<250 copies / mL). A negative result must be combined with clinical observations, patient history, and epidemiological information.  Fact Sheet for Patients:   BoilerBrush.com.cy  Fact Sheet for Healthcare Providers: https://pope.com/  This test is not yet approved or  cleared by the [place] FDA and has been authorized for detection and/or diagnosis of SARS-CoV-2 by FDA under an Emergency Use Authorization (EUA).  This EUA will remain in effect (meaning this test can be used) for the duration of the COVID-19 declaration under Section 564(b)(1) of the Act, 21 U.S.C. section 360bbb-3(b)(1), unless the authorization is terminated or revoked sooner.  Performed at [facility], [address] [place], [place] [address]   Gastrointestinal Panel by PCR , Stool     Status: None   Collection Time: 12/29/19  1:27 AM   Specimen: STOOL  Result Value Ref Range Status   Campylobacter species NOT DETECTED NOT DETECTED Final   Plesimonas shigelloides NOT DETECTED NOT DETECTED Final   Salmonella species NOT DETECTED NOT DETECTED Final   Yersinia enterocolitica NOT DETECTED NOT DETECTED Final   Vibrio species NOT DETECTED NOT DETECTED Final   Vibrio cholerae NOT DETECTED NOT DETECTED Final   Enteroaggregative E coli (EAEC) NOT DETECTED NOT DETECTED Final   Enteropathogenic E coli (EPEC) NOT DETECTED NOT DETECTED Final   Enterotoxigenic E coli (ETEC) NOT DETECTED NOT DETECTED Final   Shiga like toxin producing E coli (STEC) NOT DETECTED NOT DETECTED Final   Shigella/Enteroinvasive E coli (EIEC) NOT DETECTED NOT DETECTED Final   Cryptosporidium NOT DETECTED NOT DETECTED Final   Cyclospora cayetanensis NOT DETECTED NOT DETECTED Final   Entamoeba histolytica NOT DETECTED NOT DETECTED Final   Giardia lamblia NOT DETECTED NOT DETECTED Final   Adenovirus F40/41 NOT DETECTED NOT DETECTED Final   Astrovirus NOT DETECTED NOT  DETECTED Final   Norovirus GI/GII NOT DETECTED NOT DETECTED Final   Rotavirus A NOT DETECTED NOT DETECTED Final   Sapovirus (I, II, IV, and V) NOT DETECTED NOT DETECTED Final    Comment: Performed at [facility], [address] [place], [place] [address]     Radiology Studies: No results found.  Scheduled Meds: . vitamin C  1,000 mg Oral Daily  . atorvastatin  10 mg Oral QHS  . cholecalciferol  10,000 Units Oral Daily  . gabapentin  400 mg Oral BID  . insulin  aspart  0-15 Units Subcutaneous TID WC  . montelukast  10 mg Oral QHS  . multivitamin with minerals  1 tablet Oral Daily  . nicotine  21 mg Transdermal Daily  . pantoprazole (PROTONIX) IV  40 mg Intravenous Q24H  . sertraline  100 mg Oral Daily  . terbinafine  250 mg Oral Daily  . umeclidinium-vilanterol  1 puff Inhalation Daily   Continuous Infusions: . sodium chloride 75 mL/hr at 12/29/19 1224  . piperacillin-tazobactam (ZOSYN)  IV 3.375 g (12/29/19 1412)     LOS: 3 days   Time spent: 40 minutes.  Arnetha CourserSumayya Cayman Brogden, MD [facility]  If 7PM-7AM, please contact night-coverage [email or web address]  12/29/2019, 2:52 PM   This record has been created using Conservation officer, historic buildingsDragon voice recognition software. Errors have been sought and corrected,but may not always be located. Such creation errors do not reflect on the standard of care.

## 2019-12-30 ENCOUNTER — Encounter: Payer: Self-pay | Admitting: Gastroenterology

## 2019-12-30 ENCOUNTER — Telehealth: Payer: Self-pay

## 2019-12-30 LAB — BASIC METABOLIC PANEL
Anion gap: 6 (ref 5–15)
BUN: 14 mg/dL (ref 8–23)
CO2: 25 mmol/L (ref 22–32)
Calcium: 8.4 mg/dL — ABNORMAL LOW (ref 8.9–10.3)
Chloride: 101 mmol/L (ref 98–111)
Creatinine, Ser: 0.91 mg/dL (ref 0.44–1.00)
GFR calc Af Amer: >60 mL/min (ref >60)
GFR calc non Af Amer: >60 mL/min (ref >60)
Glucose, Bld: 144 mg/dL — ABNORMAL HIGH (ref 70–99)
Potassium: 4.3 mmol/L (ref 3.5–5.1)
Sodium: 132 mmol/L — ABNORMAL LOW (ref 135–145)

## 2019-12-30 LAB — CBC
HCT: 28.3 % — ABNORMAL LOW (ref 36.0–46.0)
Hemoglobin: 9.5 g/dL — ABNORMAL LOW (ref 12.0–15.0)
MCH: 27.4 pg (ref 26.0–34.0)
MCHC: 33.6 g/dL (ref 30.0–36.0)
MCV: 81.6 fL (ref 80.0–100.0)
Platelets: 192 10*3/uL (ref 150–400)
RBC: 3.47 MIL/uL — ABNORMAL LOW (ref 3.87–5.11)
RDW: 12.9 % (ref 11.5–15.5)
WBC: 5.7 10*3/uL (ref 4.0–10.5)
nRBC: 0 % (ref 0.0–0.2)

## 2019-12-30 LAB — GLUCOSE, CAPILLARY: Glucose-Capillary: 120 mg/dL — ABNORMAL HIGH (ref 70–99)

## 2019-12-30 LAB — SURGICAL PATHOLOGY

## 2019-12-30 NOTE — Progress Notes (Signed)
Melodie Bouillon, MD [address], [address], [place], [place], [address] [address], [address], [place], [place], [address] Phone: [phone]  Fax: [phone]   Subjective: No further hematochezia.  No abdominal pain.   Objective: Exam: Vital signs in last 24 hours: Vitals:   12/29/19 1324 12/29/19 1332 12/29/19 2311 12/30/19 0739  BP: (!) 139/52 (!) 155/47 (!) 164/62 (!) 164/43  Pulse: 62 (!) 58 (!) 58 (!) 58  Resp: 18 15 17 18   Temp:   97.9 F (36.6 C) (!) 97.5 F (36.4 C)  TempSrc:   Oral Oral  SpO2: 99% 99% 99% 97%  Weight:      Height:       Weight change:   Intake/Output Summary (Last 24 hours) at 12/30/2019 1605 Last data filed at 12/30/2019 1010 Gross per 24 hour  Intake 480 ml  Output --  Net 480 ml    General: No acute distress, AAO x3 Abd: Soft, NT/ND, No HSM Skin: Warm, no rashes Neck: Supple, Trachea midline   Lab Results: Lab Results  Component Value Date   WBC 5.7 12/30/2019   HGB 9.5 (L) 12/30/2019   HCT 28.3 (L) 12/30/2019   MCV 81.6 12/30/2019   PLT 192 12/30/2019   Micro Results: Recent Results (from the past 240 hour(s))  SARS Coronavirus 2 by RT PCR (hospital order, performed in [facility] hospital lab) Nasopharyngeal Nasopharyngeal Swab     Status: None   Collection Time: 12/26/19  2:11 PM   Specimen: Nasopharyngeal Swab  Result Value Ref Range Status   SARS Coronavirus 2 NEGATIVE NEGATIVE Final    Comment: (NOTE) SARS-CoV-2 target nucleic acids are NOT DETECTED.  The SARS-CoV-2 RNA is generally detectable in upper and lower respiratory specimens during the acute phase of infection. The lowest concentration of SARS-CoV-2 viral copies this assay can detect is 250 copies / mL. A negative result does not preclude SARS-CoV-2 infection and should not be used as the sole basis for treatment or other patient management decisions.  A negative result may occur with improper specimen collection / handling, submission of specimen  other than nasopharyngeal swab, presence of viral mutation(s) within the areas targeted by this assay, and inadequate number of viral copies (<250 copies / mL). A negative result must be combined with clinical observations, patient history, and epidemiological information.  Fact Sheet for Patients:   12/28/19  Fact Sheet for Healthcare Providers: [employer]ilerBrush[employer]com.cy  This test is not yet approved or  cleared by the https://pope.com/ FDA and has been authorized for detection and/or diagnosis of SARS-CoV-2 by FDA under an Emergency Use Authorization (EUA).  This EUA will remain in effect (meaning this test can be used) for the duration of the COVID-19 declaration under Section 564(b)(1) of the Act, 21 U.S.C. section 360bbb-3(b)(1), unless the authorization is terminated or revoked sooner.  Performed at [facility], [address] [place], [place]   Gastrointestinal Panel by PCR , Stool     Status: None   Collection Time: 12/29/19  1:27 AM   Specimen: STOOL  Result Value Ref Range Status   Campylobacter species NOT DETECTED NOT DETECTED Final   Plesimonas shigelloides NOT DETECTED NOT DETECTED Final   Salmonella species NOT DETECTED NOT DETECTED Final   Yersinia enterocolitica NOT DETECTED NOT DETECTED Final   Vibrio species NOT DETECTED NOT DETECTED Final   Vibrio cholerae NOT DETECTED NOT DETECTED Final   Enteroaggregative E coli (EAEC) NOT DETECTED NOT DETECTED Final   Enteropathogenic  E coli (EPEC) NOT DETECTED NOT DETECTED Final   Enterotoxigenic E coli (ETEC) NOT DETECTED NOT DETECTED Final   Shiga like toxin producing E coli (STEC) NOT DETECTED NOT DETECTED Final   Shigella/Enteroinvasive E coli (EIEC) NOT DETECTED NOT DETECTED Final   Cryptosporidium NOT DETECTED NOT DETECTED Final   Cyclospora cayetanensis NOT DETECTED NOT DETECTED Final   Entamoeba histolytica NOT DETECTED NOT DETECTED Final    Giardia lamblia NOT DETECTED NOT DETECTED Final   Adenovirus F40/41 NOT DETECTED NOT DETECTED Final   Astrovirus NOT DETECTED NOT DETECTED Final   Norovirus GI/GII NOT DETECTED NOT DETECTED Final   Rotavirus A NOT DETECTED NOT DETECTED Final   Sapovirus (I, II, IV, and V) NOT DETECTED NOT DETECTED Final    Comment: Performed at [facility], [address] [place], [place] [address]   Studies/Results: CT Angio Abd/Pel w/ and/or w/o  Result Date: 12/29/2019 CLINICAL DATA:  Rectal bleeding for the past 2 days. Recent CT demonstrated findings suggestive of colitis of the sigmoid colon. There is concern for ischemic colitis. EXAM: CTA ABDOMEN AND PELVIS WITHOUT AND WITH CONTRAST TECHNIQUE: Multidetector CT imaging of the abdomen and pelvis was performed using the standard protocol during bolus administration of intravenous contrast. Multiplanar reconstructed images and MIPs were obtained and reviewed to evaluate the vascular anatomy. CONTRAST:  OMNIPAQUE IOHEXOL 350 MG/ML SOLN COMPARISON:  December 26, 2019 FINDINGS: VASCULAR Aorta: There are atherosclerotic changes throughout the visualized abdominal aorta without evidence for an aneurysm or dissection Celiac: There is a high-grade stenosis at the origin of the celiac axis. SMA: There is a moderate grade stenosis at the origin of the SMA. There is mild atherosclerotic disease throughout the remaining portions of the SMA without evidence for high-grade stenosis. Renals: There are 2 left renal arteries. There is a single right renal artery. Atherosclerotic changes are noted at the origin of these arteries resulting in moderate stenosis. IMA: There is moderate stenosis at the origin of the IMA. Inflow: There are atherosclerotic changes of the iliac vasculature without evidence for high-grade stenosis. Proximal Outflow: There is a patent stent within the left SFA. There is moderate stenosis at the origin of the left SFA. There are atherosclerotic  changes throughout the right SFA likely resulting in mild to moderate multifocal stenosis. Veins: No obvious venous abnormality within the limitations of this arterial phase study. Review of the MIP images confirms the above findings. NON-VASCULAR Lower chest: There is a trace right-sided pleural effusion.There is atelectasis at the right lung base. Hepatobiliary: The liver is normal. Status post cholecystectomy.The common bile duct is dilated but tapers towards the head of the pancreas. This is likely secondary to a reservoir effect status post cholecystectomy. Pancreas: Normal contours without ductal dilatation. No peripancreatic fluid collection. Spleen: The spleen is enlarged measuring 13 cm craniocaudad. Adrenals/Urinary Tract: --Adrenal glands: Unremarkable. --Right kidney/ureter: No hydronephrosis or radiopaque kidney stones. --Left kidney/ureter: No hydronephrosis or radiopaque kidney stones. --Urinary bladder: Unremarkable. Stomach/Bowel: --Stomach/Duodenum: There is diffuse circumferential wall thickening of the visualized portions of the esophagus. The patient is status post prior gastric bypass. --Small bowel: There is a small bowel surgical anastomosis in the left mid abdomen. This anastomosis appears patent. --Colon: Again noted are findings of colitis involving the descending colon. This has improved since the prior study. There are few scattered colonic diverticula without CT evidence for diverticulitis. --Appendix: Normal. Lymphatic: --No retroperitoneal lymphadenopathy. --No mesenteric lymphadenopathy. --No pelvic or inguinal lymphadenopathy. Reproductive: Status post hysterectomy. No adnexal mass.  Other: There is no free air. There is a trace amount of free fluid the patient's pelvis, felt to be reactive in etiology. Musculoskeletal. No acute displaced fractures. IMPRESSION: 1. Improving colitis at the left hemicolon. 2. Stenoses of the SMA, celiac axis common IMA which can predispose the patient  to mesenteric ischemia. 3. Small right-sided pleural effusion with adjacent atelectasis. 4. Additional chronic findings as detailed above. 5.  Aortic Atherosclerosis (ICD10-I70.0). Electronically Signed   By: Katherine Mantle M.D.   On: 12/29/2019 21:18   Medications:  Scheduled Meds: . vitamin C  1,000 mg Oral Daily  . atorvastatin  10 mg Oral QHS  . cholecalciferol  10,000 Units Oral Daily  . gabapentin  400 mg Oral BID  . insulin aspart  0-15 Units Subcutaneous TID WC  . montelukast  10 mg Oral QHS  . multivitamin with minerals  1 tablet Oral Daily  . nicotine  21 mg Transdermal Daily  . pantoprazole (PROTONIX) IV  40 mg Intravenous Q24H  . sertraline  100 mg Oral Daily  . terbinafine  250 mg Oral Daily  . umeclidinium-vilanterol  1 puff Inhalation Daily   Continuous Infusions: . sodium chloride 75 mL/hr at 12/30/19 0606   PRN Meds:.acetaminophen, fluticasone, ipratropium-albuterol, ondansetron **OR** ondansetron (ZOFRAN) IV, oxyCODONE   Assessment: Principal Problem:   Colitis Active Problems:   Hx of smoking   Diabetes mellitus without complication (HCC)   Hyponatremia   Hypertension with heart disease   Nicotine dependence   Rectal bleeding    Plan: Pathology consistent with ischemic colitis CTA shows SMA stenosis Vascular surgery consult recommended Above discussed with Dr. Luberta Robertson, primary hospitalist GI service will sign off at this time, please page with any questions or concerns   LOS: 4 days   Melodie Bouillon, MD 12/30/2019, 4:05 PM

## 2019-12-30 NOTE — Progress Notes (Addendum)
Shift Summary:  Patient has had no acute events overnight. Patient still refusing FSBS for this shift. Patient refusing bed alarms for safety Patient Will continue to monitor patient.

## 2019-12-30 NOTE — Progress Notes (Signed)
PROGRESS NOTE    BLU MCGLAUN  ZOX:[id number]  DOB: 1945-11-19  DOA: 12/26/2019 PCP: Danelle Berry, PA-C Outpatient Specialists:   Hospital course:  74 year old female with PMH significant for DM 2, AAS, PVD status post multiple stents was admitted 12/26/2019 with hematochezia.  Colonoscopy revealed erythematous ulceration involving the proximal mid sigmoid and descending colon suggestive of ischemic colitis.  CTA was done which shows stenoses of SMA, celiac axis and common IMA.  Subjective: Patient states that she feels much better.  Has been tolerating p.o.'s without difficulty.  Is not sure why can she can go home.  Is very worried about her dogs and the fact that she has left them with her neighbors.  She would very much like to go home today.Patient is somewhat frustrated by the lack of communication while she has been here.  Discussed pathophysiology of ischemic colitis as well as significance of stenoses in the celiac axis.  Patient is aware of the concept of stenoses given her multiple stents and her PVD so she understood the need for stenting.  Patient is willing to stay till tomorrow for stents.   Objective: Vitals:   12/29/19 1332 12/29/19 2311 12/30/19 0739 12/30/19 1620  BP: (!) 155/47 (!) 164/62 (!) 164/43 (!) 133/43  Pulse: (!) 58 (!) 58 (!) 58 (!) 54  Resp: 15 17 18 18   Temp:  97.9 F (36.6 C) (!) 97.5 F (36.4 C) 98.3 F (36.8 C)  TempSrc:  Oral Oral Oral  SpO2: 99% 99% 97% 97%  Weight:      Height:        Intake/Output Summary (Last 24 hours) at 12/30/2019 1714 Last data filed at 12/30/2019 1010 Gross per 24 hour  Intake 480 ml  Output --  Net 480 ml   Filed Weights   12/26/19 1111  Weight: 83.9 kg     Exam:  General: Well-appearing female sitting up in bed in no acute distress requesting to go home. Eyes: sclera anicteric, conjuctiva mild injection bilaterally CVS: S1-S2, regular  Respiratory:  decreased air entry bilaterally secondary to  decreased inspiratory effort, rales at bases  GI: NABS, soft, no tenderness to light or deep palpation.  No rebound tenderness.  No guarding. LE: No edema.  Neuro: A/O x 3, Moving all extremities equally with normal strength, CN 3-12 intact, grossly nonfocal.  Psych: patient is logical and coherent, judgement and insight appear normal, mood and affect appropriate to situation.   Assessment & Plan:    Ischemic colitis with stenoses of SMA/celiac axis Improved with conservative treatment, bowel rest and IV fluid resuscitation. As noted above, discussed significance of stenoses and sequela, patient states she understood Discussed with IR/vascular surgery who will be able to stent her tomorrow. Antibiotics have been discontinued Continue to hold Plavix for procedure tomorrow. H&H have remained stable.  Hyponatremia.  Improved with fluid resuscitation. Most likely secondary to poor p.o. intake. Patient is also on SSRI which can be contributory.   Type 2 diabetes mellitus.   Currently diet controlled with excellent sugar control.  Depression.   Continue home dose of Zoloft  COPD No evidence acute exacerbation. Continue as needed bronchodilators.  Nicotine Dependence. Continue nicotine patch   DVT prophylaxis: SCD Code Status: Full Family Communication: Patient states she is in contact with her family Disposition Plan:   Patient is from: Home  Anticipated Discharge Location: Home  Barriers to Discharge: For stenting tomorrow  Is patient medically stable for Discharge: Not yet   Consultants:  GI  IR/vascular  Procedures:  Colonoscopy  Antimicrobials:  Previously on Zosyn, now discontinued   Data Reviewed:  Basic Metabolic Panel: Recent Labs  Lab 12/26/19 1112 12/27/19 0521 12/28/19 0527 12/29/19 0451 12/30/19 0439  NA 124* 130* 132* 127* 132*  K 4.4 5.0 4.7 4.4 4.3  CL 92* 99 102 96* 101  CO2 23 24 25 24 25   GLUCOSE 129* 121* 146* 97 144*   BUN 18 18 16 10 14   CREATININE 0.90 1.02* 1.02* 0.77 0.91  CALCIUM 8.7* 8.1* 8.5* 8.3* 8.4*   Liver Function Tests: Recent Labs  Lab 12/26/19 1112  AST 15  ALT 11  ALKPHOS 70  BILITOT 0.7  PROT 6.2*  ALBUMIN 3.6   No results for input(s): LIPASE, AMYLASE in the last 168 hours. No results for input(s): AMMONIA in the last 168 hours. CBC: Recent Labs  Lab 12/26/19 1112 12/27/19 0521 12/27/19 1741 12/28/19 0527 12/28/19 1711 12/29/19 0451 12/30/19 0439  WBC 16.3*  --   --  7.4  --   --  5.7  HGB 11.2*   < > 10.4* 9.9* 10.0* 9.6* 9.5*  HCT 32.3*   < > 31.0* 30.1* 29.3* 27.5* 28.3*  MCV 79.8*  --   --  83.8  --   --  81.6  PLT 172  --   --  154  --   --  192   < > = values in this interval not displayed.   Cardiac Enzymes: No results for input(s): CKTOTAL, CKMB, CKMBINDEX, TROPONINI in the last 168 hours. BNP (last 3 results) No results for input(s): PROBNP in the last 8760 hours. CBG: Recent Labs  Lab 12/27/19 1555 12/28/19 0754 12/28/19 1225 12/28/19 1656 12/29/19 0810  GLUCAP 102* 136* 114* 90 97    Recent Results (from the past 240 hour(s))  SARS Coronavirus 2 by RT PCR (hospital order, performed in [facility] hospital lab) Nasopharyngeal Nasopharyngeal Swab     Status: None   Collection Time: 12/26/19  2:11 PM   Specimen: Nasopharyngeal Swab  Result Value Ref Range Status   SARS Coronavirus 2 NEGATIVE NEGATIVE Final    Comment: (NOTE) SARS-CoV-2 target nucleic acids are NOT DETECTED.  The SARS-CoV-2 RNA is generally detectable in upper and lower respiratory specimens during the acute phase of infection. The lowest concentration of SARS-CoV-2 viral copies this assay can detect is 250 copies / mL. A negative result does not preclude SARS-CoV-2 infection and should not be used as the sole basis for treatment or other patient management decisions.  A negative result may occur with improper specimen collection / handling, submission of specimen  other than nasopharyngeal swab, presence of viral mutation(s) within the areas targeted by this assay, and inadequate number of viral copies (<250 copies / mL). A negative result must be combined with clinical observations, patient history, and epidemiological information.  Fact Sheet for Patients:   [facility]  Fact Sheet for Healthcare Providers: 12/28/19  This test is not yet approved or  cleared by the [employer]cy FDA and has been authorized for detection and/or diagnosis of SARS-CoV-2 by FDA under an Emergency Use Authorization (EUA).  This EUA will remain in effect (meaning this test can be used) for the duration of the COVID-19 declaration under Section 564(b)(1) of the Act, 21 U.S.C. section 360bbb-3(b)(1), unless the authorization is terminated or revoked sooner.  Performed at [facility], [address], [place], [address] [place]   Gastrointestinal Panel by PCR , Stool  Status: None   Collection Time: 12/29/19  1:27 AM   Specimen: STOOL  Result Value Ref Range Status   Campylobacter species NOT DETECTED NOT DETECTED Final   Plesimonas shigelloides NOT DETECTED NOT DETECTED Final   Salmonella species NOT DETECTED NOT DETECTED Final   Yersinia enterocolitica NOT DETECTED NOT DETECTED Final   Vibrio species NOT DETECTED NOT DETECTED Final   Vibrio cholerae NOT DETECTED NOT DETECTED Final   Enteroaggregative E coli (EAEC) NOT DETECTED NOT DETECTED Final   Enteropathogenic E coli (EPEC) NOT DETECTED NOT DETECTED Final   Enterotoxigenic E coli (ETEC) NOT DETECTED NOT DETECTED Final   Shiga like toxin producing E coli (STEC) NOT DETECTED NOT DETECTED Final   Shigella/Enteroinvasive E coli (EIEC) NOT DETECTED NOT DETECTED Final   Cryptosporidium NOT DETECTED NOT DETECTED Final   Cyclospora cayetanensis NOT DETECTED NOT DETECTED Final   Entamoeba histolytica NOT DETECTED NOT DETECTED Final    Giardia lamblia NOT DETECTED NOT DETECTED Final   Adenovirus F40/41 NOT DETECTED NOT DETECTED Final   Astrovirus NOT DETECTED NOT DETECTED Final   Norovirus GI/GII NOT DETECTED NOT DETECTED Final   Rotavirus A NOT DETECTED NOT DETECTED Final   Sapovirus (I, II, IV, and V) NOT DETECTED NOT DETECTED Final    Comment: Performed at [facility], [address] [place], [place] [address]      Studies: CT Angio Abd/Pel w/ and/or w/o  Result Date: 12/29/2019 CLINICAL DATA:  Rectal bleeding for the past 2 days. Recent CT demonstrated findings suggestive of colitis of the sigmoid colon. There is concern for ischemic colitis. EXAM: CTA ABDOMEN AND PELVIS WITHOUT AND WITH CONTRAST TECHNIQUE: Multidetector CT imaging of the abdomen and pelvis was performed using the standard protocol during bolus administration of intravenous contrast. Multiplanar reconstructed images and MIPs were obtained and reviewed to evaluate the vascular anatomy. CONTRAST:  OMNIPAQUE IOHEXOL 350 MG/ML SOLN COMPARISON:  December 26, 2019 FINDINGS: VASCULAR Aorta: There are atherosclerotic changes throughout the visualized abdominal aorta without evidence for an aneurysm or dissection Celiac: There is a high-grade stenosis at the origin of the celiac axis. SMA: There is a moderate grade stenosis at the origin of the SMA. There is mild atherosclerotic disease throughout the remaining portions of the SMA without evidence for high-grade stenosis. Renals: There are 2 left renal arteries. There is a single right renal artery. Atherosclerotic changes are noted at the origin of these arteries resulting in moderate stenosis. IMA: There is moderate stenosis at the origin of the IMA. Inflow: There are atherosclerotic changes of the iliac vasculature without evidence for high-grade stenosis. Proximal Outflow: There is a patent stent within the left SFA. There is moderate stenosis at the origin of the left SFA. There are atherosclerotic  changes throughout the right SFA likely resulting in mild to moderate multifocal stenosis. Veins: No obvious venous abnormality within the limitations of this arterial phase study. Review of the MIP images confirms the above findings. NON-VASCULAR Lower chest: There is a trace right-sided pleural effusion.There is atelectasis at the right lung base. Hepatobiliary: The liver is normal. Status post cholecystectomy.The common bile duct is dilated but tapers towards the head of the pancreas. This is likely secondary to a reservoir effect status post cholecystectomy. Pancreas: Normal contours without ductal dilatation. No peripancreatic fluid collection. Spleen: The spleen is enlarged measuring 13 cm craniocaudad. Adrenals/Urinary Tract: --Adrenal glands: Unremarkable. --Right kidney/ureter: No hydronephrosis or radiopaque kidney stones. --Left kidney/ureter: No hydronephrosis or radiopaque kidney stones. --Urinary bladder: Unremarkable.  Stomach/Bowel: --Stomach/Duodenum: There is diffuse circumferential wall thickening of the visualized portions of the esophagus. The patient is status post prior gastric bypass. --Small bowel: There is a small bowel surgical anastomosis in the left mid abdomen. This anastomosis appears patent. --Colon: Again noted are findings of colitis involving the descending colon. This has improved since the prior study. There are few scattered colonic diverticula without CT evidence for diverticulitis. --Appendix: Normal. Lymphatic: --No retroperitoneal lymphadenopathy. --No mesenteric lymphadenopathy. --No pelvic or inguinal lymphadenopathy. Reproductive: Status post hysterectomy. No adnexal mass. Other: There is no free air. There is a trace amount of free fluid the patient's pelvis, felt to be reactive in etiology. Musculoskeletal. No acute displaced fractures. IMPRESSION: 1. Improving colitis at the left hemicolon. 2. Stenoses of the SMA, celiac axis common IMA which can predispose the patient  to mesenteric ischemia. 3. Small right-sided pleural effusion with adjacent atelectasis. 4. Additional chronic findings as detailed above. 5.  Aortic Atherosclerosis (ICD10-I70.0). Electronically Signed   By: Katherine Mantlehristopher  Green M.D.   On: 12/29/2019 21:18     Scheduled Meds: . vitamin C  1,000 mg Oral Daily  . atorvastatin  10 mg Oral QHS  . cholecalciferol  10,000 Units Oral Daily  . gabapentin  400 mg Oral BID  . insulin aspart  0-15 Units Subcutaneous TID WC  . montelukast  10 mg Oral QHS  . multivitamin with minerals  1 tablet Oral Daily  . nicotine  21 mg Transdermal Daily  . pantoprazole (PROTONIX) IV  40 mg Intravenous Q24H  . sertraline  100 mg Oral Daily  . terbinafine  250 mg Oral Daily  . umeclidinium-vilanterol  1 puff Inhalation Daily   Continuous Infusions: . sodium chloride 75 mL/hr at 12/30/19 1712    Principal Problem:   Colitis Active Problems:   Hx of smoking   Diabetes mellitus without complication (HCC)   Hyponatremia   Hypertension with heart disease   Nicotine dependence   Rectal bleeding     Ahtziri Jeffries Orma Flamingublu Sreya Froio, [facility]  If 7PM-7AM, please contact night-coverage [email or web address] Password [email or web address] 12/30/2019, 5:14 PM    LOS: 4 days

## 2019-12-30 NOTE — Plan of Care (Signed)
  Problem: Health Behavior/Discharge Planning: Goal: Ability to manage health-related needs will improve Outcome: Progressing   Problem: Clinical Measurements: Goal: Ability to maintain clinical measurements within normal limits will improve Outcome: Progressing Goal: Will remain free from infection Outcome: Progressing Goal: Respiratory complications will improve Outcome: Progressing Goal: Cardiovascular complication will be avoided Outcome: Progressing   Problem: Activity: Goal: Risk for activity intolerance will decrease Outcome: Progressing   Problem: Nutrition: Goal: Adequate nutrition will be maintained Outcome: Progressing   Problem: Coping: Goal: Level of anxiety will decrease Outcome: Progressing   Problem: Pain Managment: Goal: General experience of comfort will improve Outcome: Progressing   Problem: Safety: Goal: Ability to remain free from injury will improve Outcome: Progressing   Problem: Skin Integrity: Goal: Risk for impaired skin integrity will decrease Outcome: Progressing

## 2019-12-31 ENCOUNTER — Encounter
Admission: AD | Disposition: A | Discharge status: Home / Self Care | Payer: Self-pay | Source: Home / Self Care | Attending: Internal Medicine

## 2019-12-31 ENCOUNTER — Encounter: Payer: Self-pay | Admitting: Internal Medicine

## 2019-12-31 ENCOUNTER — Inpatient Hospital Stay: Payer: Medicare (Managed Care) | Admitting: Anesthesiology

## 2019-12-31 ENCOUNTER — Other Ambulatory Visit (INDEPENDENT_AMBULATORY_CARE_PROVIDER_SITE_OTHER): Payer: Self-pay | Admitting: Vascular Surgery

## 2019-12-31 DIAGNOSIS — E871 Hypo-osmolality and hyponatremia: Secondary | ICD-10-CM

## 2019-12-31 DIAGNOSIS — R1030 Lower abdominal pain, unspecified: Secondary | ICD-10-CM

## 2019-12-31 DIAGNOSIS — K55059 Acute (reversible) ischemia of intestine, part and extent unspecified: Secondary | ICD-10-CM

## 2019-12-31 HISTORY — PX: VISCERAL ANGIOGRAPHY: CATH118276

## 2019-12-31 LAB — BASIC METABOLIC PANEL
Anion gap: 9 (ref 5–15)
BUN: 14 mg/dL (ref 8–23)
CO2: 22 mmol/L (ref 22–32)
Calcium: 8.7 mg/dL — ABNORMAL LOW (ref 8.9–10.3)
Chloride: 103 mmol/L (ref 98–111)
Creatinine, Ser: 0.76 mg/dL (ref 0.44–1.00)
GFR calc Af Amer: >60 mL/min (ref >60)
GFR calc non Af Amer: >60 mL/min (ref >60)
Glucose, Bld: 122 mg/dL — ABNORMAL HIGH (ref 70–99)
Potassium: 4.8 mmol/L (ref 3.5–5.1)
Sodium: 134 mmol/L — ABNORMAL LOW (ref 135–145)

## 2019-12-31 LAB — CBC
HCT: 29.6 % — ABNORMAL LOW (ref 36.0–46.0)
Hemoglobin: 10 g/dL — ABNORMAL LOW (ref 12.0–15.0)
MCH: 27.5 pg (ref 26.0–34.0)
MCHC: 33.8 g/dL (ref 30.0–36.0)
MCV: 81.5 fL (ref 80.0–100.0)
Platelets: 193 10*3/uL (ref 150–400)
RBC: 3.63 MIL/uL — ABNORMAL LOW (ref 3.87–5.11)
RDW: 13.1 % (ref 11.5–15.5)
WBC: 6.9 10*3/uL (ref 4.0–10.5)
nRBC: 0 % (ref 0.0–0.2)

## 2019-12-31 LAB — GLUCOSE, CAPILLARY
Glucose-Capillary: 96 mg/dL (ref 70–99)
Glucose-Capillary: 108 mg/dL — ABNORMAL HIGH (ref 70–99)

## 2019-12-31 SURGERY — VISCERAL ANGIOGRAPHY
Anesthesia: General

## 2019-12-31 MED ORDER — SODIUM CHLORIDE 0.9 % IV SOLN
INTRAVENOUS | Status: DC | PRN
  Administered 2019-12-31: 25 ug/min via INTRAVENOUS

## 2019-12-31 MED ORDER — HYDROMORPHONE HCL 1 MG/ML IJ SOLN: 1.0000 mg | Freq: Once | INTRAMUSCULAR | Status: DC | PRN

## 2019-12-31 MED ORDER — SEVOFLURANE IN SOLN
RESPIRATORY_TRACT | Status: AC
  Filled 2019-12-31: qty 250

## 2019-12-31 MED ORDER — FENTANYL CITRATE (PF) 100 MCG/2ML IJ SOLN
INTRAMUSCULAR | Status: AC
  Filled 2019-12-31: qty 2

## 2019-12-31 MED ORDER — FAMOTIDINE 20 MG PO TABS: 40.0000 mg | ORAL_TABLET | Freq: Once | ORAL | Status: DC | PRN

## 2019-12-31 MED ORDER — EPHEDRINE SULFATE 50 MG/ML IJ SOLN
INTRAMUSCULAR | Status: DC | PRN
  Administered 2019-12-31: 10 mg via INTRAVENOUS

## 2019-12-31 MED ORDER — PROPOFOL 10 MG/ML IV BOLUS
INTRAVENOUS | Status: AC
  Filled 2019-12-31: qty 20

## 2019-12-31 MED ORDER — CLOPIDOGREL BISULFATE 75 MG PO TABS: 75.0000 mg | ORAL_TABLET | Freq: Every day | ORAL | Status: DC

## 2019-12-31 MED ORDER — LIDOCAINE HCL (CARDIAC) PF 100 MG/5ML IV SOSY
PREFILLED_SYRINGE | INTRAVENOUS | Status: DC | PRN
  Administered 2019-12-31: 100 mg via INTRAVENOUS

## 2019-12-31 MED ORDER — MIDAZOLAM HCL 2 MG/2ML IJ SOLN
INTRAMUSCULAR | Status: AC
  Filled 2019-12-31: qty 2

## 2019-12-31 MED ORDER — ONDANSETRON HCL 4 MG/2ML IJ SOLN: 4.0000 mg | Freq: Four times a day (QID) | INTRAMUSCULAR | Status: DC | PRN

## 2019-12-31 MED ORDER — DIPHENHYDRAMINE HCL 50 MG/ML IJ SOLN: 50.0000 mg | Freq: Once | INTRAMUSCULAR | Status: DC | PRN

## 2019-12-31 MED ORDER — SODIUM CHLORIDE 0.9 % IV SOLN: INTRAVENOUS | Status: DC

## 2019-12-31 MED ORDER — CEFAZOLIN SODIUM-DEXTROSE 2-4 GM/100ML-% IV SOLN
2.0000 g | Freq: Once | INTRAVENOUS | Status: DC
  Filled 2019-12-31: qty 100

## 2019-12-31 MED ORDER — ACETAMINOPHEN 325 MG PO TABS: 650.0000 mg | ORAL_TABLET | Freq: Four times a day (QID) | ORAL | 0 refills | Status: AC | PRN

## 2019-12-31 MED ORDER — ACETAMINOPHEN 10 MG/ML IV SOLN: 1000.0000 mg | Freq: Once | INTRAVENOUS | Status: DC | PRN

## 2019-12-31 MED ORDER — METHYLPREDNISOLONE SODIUM SUCC 125 MG IJ SOLR: 125.0000 mg | Freq: Once | INTRAMUSCULAR | Status: DC | PRN

## 2019-12-31 MED ORDER — DEXAMETHASONE SODIUM PHOSPHATE 10 MG/ML IJ SOLN
INTRAMUSCULAR | Status: DC | PRN
  Administered 2019-12-31: 10 mg via INTRAVENOUS

## 2019-12-31 MED ORDER — PROPOFOL 10 MG/ML IV BOLUS
INTRAVENOUS | Status: DC | PRN
  Administered 2019-12-31: 110 mg via INTRAVENOUS

## 2019-12-31 MED ORDER — HEPARIN SODIUM (PORCINE) 1000 UNIT/ML IJ SOLN
INTRAMUSCULAR | Status: DC | PRN
  Administered 2019-12-31: 5000 [IU] via INTRAVENOUS

## 2019-12-31 MED ORDER — PHENYLEPHRINE HCL (PRESSORS) 10 MG/ML IV SOLN
INTRAVENOUS | Status: DC | PRN
  Administered 2019-12-31: 100 ug via INTRAVENOUS
  Administered 2019-12-31: 50 ug via INTRAVENOUS

## 2019-12-31 MED ORDER — MIDAZOLAM HCL 2 MG/2ML IJ SOLN
INTRAMUSCULAR | Status: DC | PRN
  Administered 2019-12-31: 2 mg via INTRAVENOUS

## 2019-12-31 MED ORDER — MIDAZOLAM HCL 2 MG/ML PO SYRP
8.0000 mg | ORAL_SOLUTION | Freq: Once | ORAL | Status: DC | PRN
  Filled 2019-12-31: qty 4

## 2019-12-31 MED ORDER — FENTANYL CITRATE (PF) 100 MCG/2ML IJ SOLN: 25.0000 ug | INTRAMUSCULAR | Status: DC | PRN

## 2019-12-31 MED ORDER — ONDANSETRON HCL 4 MG/2ML IJ SOLN
INTRAMUSCULAR | Status: DC | PRN
  Administered 2019-12-31: 4 mg via INTRAVENOUS

## 2019-12-31 MED ORDER — FENTANYL CITRATE (PF) 100 MCG/2ML IJ SOLN
INTRAMUSCULAR | Status: DC | PRN
  Administered 2019-12-31 (×2): 50 ug via INTRAVENOUS

## 2019-12-31 MED ORDER — ONDANSETRON HCL 4 MG/2ML IJ SOLN: 4.0000 mg | Freq: Once | INTRAMUSCULAR | Status: DC | PRN

## 2019-12-31 SURGICAL SUPPLY — 16 items
BALLN MUSTANG 7X20X135 (BALLOONS) ×3
BALLOON MUSTANG 7X20X135 (BALLOONS) IMPLANT
CATH ANGIO 5F PIGTAIL 65CM (CATHETERS) ×2 IMPLANT
CATH VS15FR (CATHETERS) ×2 IMPLANT
DEVICE PRESTO INFLATION (MISCELLANEOUS) ×2 IMPLANT
DEVICE STARCLOSE SE CLOSURE (Vascular Products) ×2 IMPLANT
GLIDEWIRE STIFF .35X180X3 HYDR (WIRE) ×2 IMPLANT
PACK ANGIOGRAPHY (CUSTOM PROCEDURE TRAY) ×2 IMPLANT
SHEATH BRITE TIP 5FRX11 (SHEATH) ×2 IMPLANT
SHEATH FLEXOR ANSEL2 7FRX45 (SHEATH) ×2 IMPLANT
STENT VIABAHN VBX 6X39X135 (Permanent Stent) ×2 IMPLANT
SYR MEDRAD MARK 7 150ML (SYRINGE) ×2 IMPLANT
TUBING CONTRAST HIGH PRESS 72 (TUBING) ×3 IMPLANT
WIRE J 3MM .035X145CM (WIRE) ×5 IMPLANT
WIRE MAGIC TOR.035 180C (WIRE) ×2 IMPLANT
WIRE MAGIC TORQUE 260C (WIRE) ×2 IMPLANT

## 2019-12-31 NOTE — Op Note (Signed)
[facility] Percutaneous Study/Intervention Procedural Note   Date: 12/31/2019  Surgeon(s): Leotis Pain, MD  Assistants: none  Pre-operative Diagnosis: 1.  Chronic mesenteric ischemia with ischemic colitis 2.  CT scan demonstrating significant celiac, SMA, and IMA stenosis   Post-operative diagnosis: Same  Procedure(s) Performed: 1. Ultrasound guidance for vascular access right femoral artery  2. Catheter placement into SMA from right femoral approach 3. Aortogram and selective angiogram of the SMA 4. Stent to the SMA with 6 mm diameter x 29 mm length balloon expandable VBX stent postdilated with a 7 mm balloon proximally 5. StarClose closure device right femoral artery  Contrast: 45  Fluoro time: 3.7  EBL: 5 cc  Anesthesia: General  Indications: Patient is a 74 y.o. female who has symptoms consistent with mesenteric ischemia with a recent admission for ischemic colitis. The patient has a CT scan showing disease at the origin of all 3 visceral vessels. The patient is brought in for angiography for further evaluation and potential treatment. Risks and benefits are discussed and informed consent is obtained  Procedure: The patient was identified and appropriate procedural time out was performed. The patient was then placed supine on the table and prepped and draped in the usual sterile fashion.Moderate conscious sedation was administered during a face to face encounter with the patient throughout the procedure with my supervision of the RN administering medicines and monitoring the patient's vital signs, pulse oximetry, telemetry and mental status throughout from the start of the procedure until the patient was taken to the recovery room. Ultrasound was used to evaluate the right common femoral artery. It was patent . A digital ultrasound image was acquired. A Seldinger needle was  used to access the right common femoral artery under direct ultrasound guidance and a permanent image was performed. A 0.035 J wire was advanced without resistance and a 5Fr sheath was placed. Pigtail catheter was placed into the aorta and an AP aortogram was performed. We transitioned to the lateral projection to image the celiac and SMA. These demonstrated calcific stenosis of both the origin of the celiac and superior mesenteric artery of greater than 70%.  Both renal arteries also appear to have at least moderate stenosis although the left origin was not well seen.  Aorta was highly calcific but not stenotic.  The origin of the IMA was not well seen but there was a large meandering mesenteric vessel coming off of the IMA that was best seen in AP projection. The patient was given 5000 units of IV heparin.We upsized to a 7 Fr sheath.  A V S1 catheter was used to selectively cannulate the SMA. This demonstrated a 75 to 80% stenosis of the proximal portion of the SMA. Based on her symptoms and these findings, I elected to treat the SMA to try to improve the patient's clinical course. I crossed the lesion without difficulty with the Magic torque wire and advanced a 7 [place] Ansell sheath out to the SMA beyond the lesion. I then used a 6 mm diameter x 29 mm length balloon expandable VBX stent to perform treatment of the SMA. I inflated the balloon to 14 atm.  The proximal portion was then postdilated with a 7 mm diameter by 20 mm length balloon.  On completion angiogram following this, less than 10% residual stenosis was identified. At this point, I elected to terminate the procedure. The diagnostic catheter was removed. StarClose closure device was deployed in usual fashion with excellent hemostatic result. The patient was  taken to the recovery room in stable condition having tolerated the procedure well.     Findings:Calcific stenosis of both the origin of the celiac and superior mesenteric artery of  greater than 70%.  Both renal arteries also appear to have at least moderate stenosis although the left origin was not well seen.  Aorta was highly calcific but not stenotic.  Disposition: Patient was taken to the recovery room in stable condition having tolerated the procedure well.  Complications: None  Leotis Pain 12/31/2019 1:42 PM   This note was created with Dragon Medical transcription system. Any errors in dictation are purely unintentional.

## 2019-12-31 NOTE — Anesthesia Preprocedure Evaluation (Signed)
Anesthesia Evaluation  Patient identified by MRN, date of birth, ID band Patient awake    Reviewed: Allergy & Precautions, H&P , NPO status , reviewed documented beta blocker date and time   History of Anesthesia Complications (+) PONV and history of anesthetic complications  Airway Mallampati: III  TM Distance: >3 FB Neck ROM: limited    Dental  (+) Partial Lower, Caps, Chipped, Missing, Dental Advisory Given   Pulmonary shortness of breath, asthma , pneumonia, resolved, COPD,  COPD inhaler, neg recent URI, Current Smoker and Patient abstained from smoking.,    Pulmonary exam normal breath sounds clear to auscultation + decreased breath sounds      Cardiovascular hypertension, + angina + CAD and + Peripheral Vascular Disease  (-) Past MI and (-) Cardiac Stents Normal cardiovascular exam+ dysrhythmias + Valvular Problems/Murmurs AS  Rhythm:Regular Rate:Normal - Systolic murmurs 40/0867 ECHO Study Conclusions   - Left ventricle: The cavity size was normal. There was mild  concentric hypertrophy. Systolic function was normal. The  estimated ejection fraction was in the range of 60% to 65%. Wall  motion was normal; there were no regional wall motion  abnormalities. Doppler parameters are consistent with abnormal  left ventricular relaxation (grade 1 diastolic dysfunction).  - Aortic valve: Trileaflet; severely calcified leaflets. There was  mild stenosis. Peak velocity (S): 288 cm/s. Mean gradient (S): 17  mm Hg. Valve area (VTI): 1.31 cm^2.  - Left atrium: The atrium was mildly dilated.  - Right ventricle: Systolic function was normal.  - Pulmonary arteries: Systolic pressure was within the normal  range.    Neuro/Psych PSYCHIATRIC DISORDERS Anxiety Depression negative neurological ROS     GI/Hepatic Neg liver ROS, hiatal hernia, PUD, GERD  Controlled,  Endo/Other  diabetes  Renal/GU negative Renal ROS      Musculoskeletal  (+) Arthritis ,   Abdominal   Peds  Hematology  (+) Blood dyscrasia, anemia ,   Anesthesia Other Findings Past Medical History: No date: Allergy     Comment:  seasonal allergies 2014: Anemia     Comment:  needed 5 units of blood d/t passing out, weak No date: Anxiety 12/24/2017: Aortic stenosis     Comment:  Echo Aug 2018 No date: Arthritis No date: Asthma     Comment:  allergy induced asthma 12/12/2017: Cardiac murmur No date: Cataract No date: Cataract     Comment:  left No date: Complication of anesthesia     Comment:  arrhythmia following colonoscopy No date: Degenerative disc disease, lumbar No date: Degenerative disc disease, lumbar No date: Depression No date: Diabetes mellitus 11/16/2015: Diabetic neuropathy (HCC) No date: Dysrhythmia     Comment:  stenosis of left ventricle No date: GERD (gastroesophageal reflux disease) No date: H/O transfusion     Comment:  patient was given 5 units of blood while at [facility],               blood type O+ No date: History of chicken pox No date: History of hiatal hernia No date: History of measles, mumps, or rubella No date: HOH (hard of hearing)     Comment:  does not use hearing aides yet No date: Hyperlipidemia No date: Hypertension No date: Irregular heartbeat 12/24/2017: LVH (left ventricular hypertrophy)     Comment:  Echo Aug 2018 No date: Neuropathy 11/16/2015: Opiate use 05/06/2018: Peripheral arterial disease (HCC)     Comment:  At rest, left > right; refer to vasc 11/16/2015: Pes planus of both feet 11/16/2015:  Plantar fasciitis of right foot No date: Pneumonia No date: Wheezing  Past Surgical History: No date: ABDOMINAL HYSTERECTOMY 07/23/2018: APPLICATION OF WOUND VAC; Left     Comment:  Procedure: APPLICATION OF WOUND VAC;  Surgeon: Annice Needy, MD;  Location: [facility];  Service: Vascular;                Laterality: Left; No date: banck injections 05/30/2015: CATARACT  EXTRACTION W/PHACO; Right     Comment:  Procedure: CATARACT EXTRACTION PHACO AND INTRAOCULAR               LENS PLACEMENT (IOC);  Surgeon: Galen Manila, MD;                Location: [facility];  Service: Ophthalmology;  Laterality:              Right;  [place] 00:57 AP% 20.9 CDE 11.99 fluid pack lot               #[id number] H 1970: CHOLECYSTECTOMY 2013: COLON SURGERY     Comment:  blocked colon 01/20/2018: COLONOSCOPY WITH PROPOFOL; N/A     Comment:  Procedure: COLONOSCOPY WITH PROPOFOL;  Surgeon: Midge Minium, MD;  Location: [facility];  Service:               Endoscopy;  Laterality: N/A; 07/08/2018: ENDARTERECTOMY FEMORAL; Left     Comment:  Procedure: ENDARTERECTOMY FEMORAL;  Surgeon: Annice Needy, MD;  Location: [facility];  Service: Vascular;                Laterality: Left; No date: EYE SURGERY; Right     Comment:  cataract surgery 2018: FOOT FUSION; Right     Comment:  metal in foot 1970: GALLBLADDER SURGERY 2010: GASTRIC BYPASS     Comment:  lost 178 lbs and regained 40 lbs last few years No date: HUMERUS FRACTURE SURGERY; Right     Comment:  metal plate with screws 2016: internal bleeding     Comment:  ulcer in past 07/08/2018: LOWER EXTREMITY ANGIOGRAM; Left     Comment:  Procedure: LOWER EXTREMITY ANGIOGRAM;  Surgeon: Annice Needy, MD;  Location: [facility];  Service: Vascular;                Laterality: Left; 05/27/2018: LOWER EXTREMITY ANGIOGRAPHY; Left     Comment:  Procedure: LOWER EXTREMITY ANGIOGRAPHY;  Surgeon: Annice Needy, MD;  Location: [facility];  Service:               Cardiovascular;  Laterality: Left; 12/07/2018: LOWER EXTREMITY ANGIOGRAPHY; Right     Comment:  Procedure: LOWER EXTREMITY ANGIOGRAPHY;  Surgeon: Annice Needy, MD;  Location: [facility];  Service:               Cardiovascular;  Laterality: Right; 05/10/2019: LOWER EXTREMITY ANGIOGRAPHY; Left     Comment:   Procedure: LOWER EXTREMITY ANGIOGRAPHY;  Surgeon: Wyn Quaker,  Marlow Baars, MD;  Location: [facility];  Service:               Cardiovascular;  Laterality: Left; No date: MOUTH SURGERY     Comment:  root canals and crowns and extractions No date: OVARY SURGERY 2018: SKIN GRAFT; Right     Comment:  RT foot. foot has been rebuilt.  it is full of metal No date: TENOTOMY ACHILLES TENDON; Right     Comment:  Percuntaneous. metal in foot No date: TONSILLECTOMY 07/23/2018: WOUND DEBRIDEMENT; Left     Comment:  Procedure: DEBRIDEMENT WOUND;  Surgeon: Annice Needy,               MD;  Location: [facility];  Service: Vascular;  Laterality:              Left;  BMI    Body Mass Index: 36.05 kg/m      Reproductive/Obstetrics                             Anesthesia Physical  Anesthesia Plan  ASA: IV  Anesthesia Plan: General   Post-op Pain Management:    Induction: Intravenous  PONV Risk Score and Plan: 3 and Treatment may vary due to age or medical condition, TIVA and Propofol infusion  Airway Management Planned: LMA  Additional Equipment: None  Intra-op Plan:   Post-operative Plan: Extubation in OR  Informed Consent: I have reviewed the patients History and Physical, chart, labs and discussed the procedure including the risks, benefits and alternatives for the proposed anesthesia with the patient or authorized representative who has indicated his/her understanding and acceptance.     Dental Advisory Given  Plan Discussed with: CRNA  Anesthesia Plan Comments: (Patient denies any nausea/vomiting since she's been in the hospital.  Discussed risks of anesthesia with patient, including PONV, sore throat, aspiration, lip/dental damage. Rare risks discussed as well, such as cardiorespiratory and neurological sequelae. Patient understands. Patient counseled on being higher risk for anesthesia due to comorbidities: smoking, COPD, ischemic colitis.  Patient was told about increased risk of cardiac and respiratory events, including death. Patient understands. )        Anesthesia Quick Evaluation

## 2019-12-31 NOTE — Discharge Summary (Signed)
Andrea Holden ETK:[id number] DOB: December 28, 1945 DOA: 12/26/2019  PCP: Andrea Grana, PA-C  Admit date: 12/26/2019  Discharge date: 12/31/2019  Admitted From: Home   disposition: Home   Recommendations for Outpatient Follow-up:   Follow up with PCP in 1-2 weeks  PCP Please obtain BMP/CBC in 1 week.  Please also check her groin area.   Home Health: None Equipment/Devices: None Consultations: GI, vascular surgery Discharge Condition: Improved CODE STATUS: Full Diet Recommendation: Heart Healthy   Diet Order            Diet regular Room service appropriate? Yes; Fluid consistency: Thin  Diet effective now           Diet - low sodium heart healthy                  Chief Complaint  Patient presents with  . Rectal Bleeding     Brief history of present illness from the day of admission and additional interim summary     74 year old female with PMH significant for DM 2, AAS, PVD status post multiple stents was admitted 12/26/2019 with hematochezia.    Patient states that her symptoms initially started with nausea, vomiting and diarrhea and then she developed lower abdominal cramping mostly in the left lower quadrant with loose bloody stools. She states that she has had about 5 episodes. She denies any recent antibiotic use and has not had any fever or chills Rectal exam per nurse practitioner in the ER showed bright red blood. She had a CT scan of abdomen and pelvis done whichshowed colitis involving the descending and proximal sigmoid colon, question infection versus inflammatory bowel disease, ischemia considered less likely. No evidence of perforation or abscess.                                                                 Hospital Course    Patient was seen by GI and underwent a colonoscopy which  revealed erythematous ulceration involving the proximal mid sigmoid and descending colon suggestive of ischemic colitis.    Patient's H&H were followed and have remained stable. CTA was done which shows stenoses of SMA, celiac axis and common IMA.  Patient was seen by vascular surgery Dr. Flossie Holden who knew her well from previous PVD stents.  She underwent stenting of her SMA and celiac axis on day of discharge.  Patient was very insistent about going home and after discussion with Dr. Bunnie Domino PA Andrea Holden, it was agreed that she would be able to go home after stenting.  She will follow up with Dr. Lucky Holden in 1 week.   Ischemic colitis with stenoses of SMA/celiac axis Improved with conservative treatment, bowel rest and IV fluid resuscitation. Patient underwent stent to SMA with Star closure device in right femoral artery without complication today.  Per discussion with Andrea Holden, patient is okay to go home with Star closure device in place to follow-up with them in 1 week. H&H have remained stable. Restart Plavix tomorrow.  Hyponatremia. Improved with fluid resuscitation. Most likely secondary to poor p.o. intake. Patient is also on SSRI which can be contributory.  Type 2 diabetes mellitus. Currently diet controlled with excellent sugar control.  Depression. Continue home dose of Zoloft  COPD No evidence acute exacerbation. Continue as needed bronchodilators.  Nicotine Dependence. Continue nicotine patch   Discharge diagnosis     Principal Problem:   Colitis Active Problems:   Hx of smoking   Diabetes mellitus without complication (HCC)   Hyponatremia   Hypertension with heart disease   Nicotine dependence   Rectal bleeding    Discharge instructions    Discharge Instructions    Diet - low sodium heart healthy   Complete by: As directed    Discharge instructions   Complete by: As directed    1.  Please be very careful with the dressing in your groin for the next 24  hours.  No strenuous activity, no running, no heavy lifting.  If you cough please apply gentle pressure on the dressing.   2.  If you see increased swelling in the dressing area please contact Dr. Lucky Holden or your PCP or come to the emergency room for evaluation. 3.  You can restart your Plavix (clopidogrel) tomorrow. 4.  If you notice any further blood in your stool come to the emergency room. 5.  Make sure you keep your follow-up appointment with Dr. Lucky Holden as already scheduled.   Discharge wound care:   Complete by: As directed    As per instructions provided by vascular surgery Dr. Lucky Holden   Increase activity slowly   Complete by: As directed       Discharge Medications   Allergies as of 12/31/2019      Reactions   Meloxicam Other (See Comments)   GI bleeding   Nsaids Other (See Comments)   Gi bleeding   Sitagliptin Hives, Itching   Januvia       Medication List    STOP taking these medications   Acetaminophen 500 MG capsule Replaced by: acetaminophen 325 MG tablet     TAKE these medications   acetaminophen 325 MG tablet Commonly known as: TYLENOL Take 2 tablets (650 mg total) by mouth every 6 (six) hours as needed for mild pain, moderate pain, headache or fever. Replaces: Acetaminophen 500 MG capsule   albuterol 108 (90 Base) MCG/ACT inhaler Commonly known as: Ventolin HFA Inhale 2 puffs into the lungs every 6 (six) hours as needed for wheezing or shortness of breath. Notes to patient: Not given in hospital   atorvastatin 10 MG tablet Commonly known as: LIPITOR Take 1 tablet (10 mg total) by mouth at bedtime.   clopidogrel 75 MG tablet Commonly known as: PLAVIX Take 1 tablet (75 mg total) by mouth daily. Notes to patient: Not given in hospital   fluticasone 50 MCG/ACT nasal spray Commonly known as: FLONASE Place 2 sprays into both nostrils daily as needed. What changed: reasons to take this   gabapentin 400 MG capsule Commonly known as: NEURONTIN Take 1 capsule (400  mg total) by mouth 2 (two) times daily. Notes to patient: Last dose given today at 9:46am   ipratropium-albuterol 0.5-2.5 (3) MG/3ML Soln Commonly known as: DUONEB Take 3 mLs by nebulization 3 (three) times daily as needed. What changed: reasons to take this  Krill Oil 1000 MG Caps Take 1,000 mg by mouth daily at 6 (six) AM. Notes to patient: Not given in hospital   lisinopril 10 MG tablet Commonly known as: ZESTRIL Take 1 tablet (10 mg total) by mouth daily. Notes to patient: Not given in hospital   montelukast 10 MG tablet Commonly known as: SINGULAIR Take 1 tablet (10 mg total) by mouth at bedtime. Notes to patient: Last dose given 12/28/19 at 9:16pm   Multivitamin Adults 50+ Tabs Take 1 tablet by mouth daily. Notes to patient: Last dose given 12/28/19 at 10:03am   Nebulizer/Tubing/Mouthpiece Kit Disp one nebulizer machine, tubing set and mouthpiece kit Notes to patient: Not given in hospital   pantoprazole 20 MG tablet Commonly known as: PROTONIX Take 1 tablet (20 mg total) by mouth daily. Notes to patient: Last dose given 12/28/19 at 5:39pm   sertraline 100 MG tablet Commonly known as: ZOLOFT TAKE 1 AND 1/2 TABLETS (150 MG TOTAL) BY MOUTH DAILY What changed:   how much to take  how to take this  when to take this  additional instructions   terbinafine 250 MG tablet Commonly known as: LamISIL Take 1 tablet (250 mg total) by mouth daily. Notes to patient: Last dose given today at 9:45am   umeclidinium-vilanterol 62.5-25 MCG/INH Aepb Commonly known as: ANORO ELLIPTA Inhale 1 puff into the lungs daily. Notes to patient: Last dose given today at 10:27am   vitamin C 500 MG tablet Commonly known as: ASCORBIC ACID Take 1,000 mg by mouth daily. Notes to patient: Last dose given 12/28/19 at 10:03am   Vitamin D3 250 MCG (10000 UT) Tabs Take 10,000 Units by mouth daily. Notes to patient: Last dose given 12/28/19 at 10:03am            Discharge Care  Instructions  (From admission, onward)         Start     Ordered   12/31/19 0000  Discharge wound care:       Comments: As per instructions provided by vascular surgery Dr. Lucky Holden   12/31/19 1732           Follow-up Information    Dew, Erskine Squibb, MD. Call in 1 week(s).   Specialties: Vascular Surgery, Radiology, Interventional Cardiology Contact information: [place] [address] [phone]        Andrea Grana, PA-C. Schedule an appointment as soon as possible for a visit in 1 week(s).   Specialty: Family Medicine Why: Please check CBC and groin area. Contact information: [address] [place]  [phone]               Major procedures and Radiology Reports - PLEASE review detailed and final reports thoroughly  -       CT ABDOMEN PELVIS W CONTRAST  Result Date: 12/26/2019 CLINICAL DATA:  Acute abdominal pain, LEFT lower quadrant tenderness, leukocytosis, bright red blood per rectum; history diabetes mellitus, hypertension, smoker coronary artery disease EXAM: CT ABDOMEN AND PELVIS WITH CONTRAST TECHNIQUE: Multidetector CT imaging of the abdomen and pelvis was performed using the standard protocol following bolus administration of intravenous contrast. Sagittal and coronal MPR images reconstructed from axial data set. CONTRAST:  66m OMNIPAQUE IOHEXOL 300 MG/ML SOLN IV. No oral contrast. COMPARISON:  None FINDINGS: Lower chest: Lung bases clear Hepatobiliary: Gallbladder surgically absent. Low-attenuation focus LEFT lobe liver 8 mm greatest size likely tiny cyst. Remainder of liver unremarkable Pancreas: Atrophic pancreas without mass Spleen: Normal appearance Adrenals/Urinary Tract: Adrenal glands  normal appearance. Tiny nonobstructing LEFT renal calculus. Kidneys, ureters, and bladder otherwise normal appearance. Stomach/Bowel: Appendix not visualized. Wall thickening of the descending and proximal sigmoid colon consistent with  colitis. Associated infiltration of surrounding mesocolon. No colonic diverticula identified. No evidence of perforation or abscess. Prior bariatric surgery. Stomach and bowel loops otherwise unremarkable. Vascular/Lymphatic: Atherosclerotic calcifications aorta, visceral arteries, iliac arteries, coronary arteries. Aorta normal caliber. No adenopathy. Multiple pelvic phleboliths. Reproductive: Uterus surgically absent. Nonvisualization of ovaries. Other: No free air or free fluid. Musculoskeletal: Multilevel degenerative disc and facet disease changes lumbar spine. IMPRESSION: Colitis involving the descending and proximal sigmoid colon, question infection versus inflammatory bowel disease, ischemia considered less likely. No evidence of perforation or abscess. Tiny nonobstructing LEFT renal calculus. Aortic Atherosclerosis (ICD10-I70.0). Electronically Signed   By: Lavonia Dana M.D.   On: 12/26/2019 15:33   PERIPHERAL VASCULAR CATHETERIZATION  Result Date: 12/31/2019 See op note  CT Angio Abd/Pel w/ and/or w/o  Result Date: 12/29/2019 CLINICAL DATA:  Rectal bleeding for the past 2 days. Recent CT demonstrated findings suggestive of colitis of the sigmoid colon. There is concern for ischemic colitis. EXAM: CTA ABDOMEN AND PELVIS WITHOUT AND WITH CONTRAST TECHNIQUE: Multidetector CT imaging of the abdomen and pelvis was performed using the standard protocol during bolus administration of intravenous contrast. Multiplanar reconstructed images and MIPs were obtained and reviewed to evaluate the vascular anatomy. CONTRAST:  179m OMNIPAQUE IOHEXOL 350 MG/ML SOLN COMPARISON:  December 26, 2019 FINDINGS: VASCULAR Aorta: There are atherosclerotic changes throughout the visualized abdominal aorta without evidence for an aneurysm or dissection Celiac: There is a high-grade stenosis at the origin of the celiac axis. SMA: There is a moderate grade stenosis at the origin of the SMA. There is mild atherosclerotic disease  throughout the remaining portions of the SMA without evidence for high-grade stenosis. Renals: There are 2 left renal arteries. There is a single right renal artery. Atherosclerotic changes are noted at the origin of these arteries resulting in moderate stenosis. IMA: There is moderate stenosis at the origin of the IMA. Inflow: There are atherosclerotic changes of the iliac vasculature without evidence for high-grade stenosis. Proximal Outflow: There is a patent stent within the left SFA. There is moderate stenosis at the origin of the left SFA. There are atherosclerotic changes throughout the right SFA likely resulting in mild to moderate multifocal stenosis. Veins: No obvious venous abnormality within the limitations of this arterial phase study. Review of the MIP images confirms the above findings. NON-VASCULAR Lower chest: There is a trace right-sided pleural effusion.There is atelectasis at the right lung base. Hepatobiliary: The liver is normal. Status post cholecystectomy.The common bile duct is dilated but tapers towards the head of the pancreas. This is likely secondary to a reservoir effect status post cholecystectomy. Pancreas: Normal contours without ductal dilatation. No peripancreatic fluid collection. Spleen: The spleen is enlarged measuring 13 cm craniocaudad. Adrenals/Urinary Tract: --Adrenal glands: Unremarkable. --Right kidney/ureter: No hydronephrosis or radiopaque kidney stones. --Left kidney/ureter: No hydronephrosis or radiopaque kidney stones. --Urinary bladder: Unremarkable. Stomach/Bowel: --Stomach/Duodenum: There is diffuse circumferential wall thickening of the visualized portions of the esophagus. The patient is status post prior gastric bypass. --Small bowel: There is a small bowel surgical anastomosis in the left mid abdomen. This anastomosis appears patent. --Colon: Again noted are findings of colitis involving the descending colon. This has improved since the prior study. There are  few scattered colonic diverticula without CT evidence for diverticulitis. --Appendix: Normal. Lymphatic: --No retroperitoneal lymphadenopathy. --No mesenteric lymphadenopathy. --No  pelvic or inguinal lymphadenopathy. Reproductive: Status post hysterectomy. No adnexal mass. Other: There is no free air. There is a trace amount of free fluid the patient's pelvis, felt to be reactive in etiology. Musculoskeletal. No acute displaced fractures. IMPRESSION: 1. Improving colitis at the left hemicolon. 2. Stenoses of the SMA, celiac axis common IMA which can predispose the patient to mesenteric ischemia. 3. Small right-sided pleural effusion with adjacent atelectasis. 4. Additional chronic findings as detailed above. 5.  Aortic Atherosclerosis (ICD10-I70.0). Electronically Signed   By: Constance Holster M.D.   On: 12/29/2019 21:18    Micro Results    Recent Results (from the past 240 hour(s))  SARS Coronavirus 2 by RT PCR (hospital order, performed in [facility] hospital lab) Nasopharyngeal Nasopharyngeal Swab     Status: None   Collection Time: 12/26/19  2:11 PM   Specimen: Nasopharyngeal Swab  Result Value Ref Range Status   SARS Coronavirus 2 NEGATIVE NEGATIVE Final    Comment: (NOTE) SARS-CoV-2 target nucleic acids are NOT DETECTED.  The SARS-CoV-2 RNA is generally detectable in upper and lower respiratory specimens during the acute phase of infection. The lowest concentration of SARS-CoV-2 viral copies this assay can detect is 250 copies / mL. A negative result does not preclude SARS-CoV-2 infection and should not be used as the sole basis for treatment or other patient management decisions.  A negative result may occur with improper specimen collection / handling, submission of specimen other than nasopharyngeal swab, presence of viral mutation(s) within the areas targeted by this assay, and inadequate number of viral copies (<250 copies / mL). A negative result must be combined with  clinical observations, patient history, and epidemiological information.  Fact Sheet for Patients:   StrictlyIdeas.no  Fact Sheet for Healthcare Providers: BankingDealers.co.za  This test is not yet approved or  cleared by the [place] FDA and has been authorized for detection and/or diagnosis of SARS-CoV-2 by FDA under an Emergency Use Authorization (EUA).  This EUA will remain in effect (meaning this test can be used) for the duration of the COVID-19 declaration under Section 564(b)(1) of the Act, 21 U.S.C. section 360bbb-3(b)(1), unless the authorization is terminated or revoked sooner.  Performed at [facility], [address] [place], [place] [address]   Gastrointestinal Panel by PCR , Stool     Status: None   Collection Time: 12/29/19  1:27 AM   Specimen: STOOL  Result Value Ref Range Status   Campylobacter species NOT DETECTED NOT DETECTED Final   Plesimonas shigelloides NOT DETECTED NOT DETECTED Final   Salmonella species NOT DETECTED NOT DETECTED Final   Yersinia enterocolitica NOT DETECTED NOT DETECTED Final   Vibrio species NOT DETECTED NOT DETECTED Final   Vibrio cholerae NOT DETECTED NOT DETECTED Final   Enteroaggregative E coli (EAEC) NOT DETECTED NOT DETECTED Final   Enteropathogenic E coli (EPEC) NOT DETECTED NOT DETECTED Final   Enterotoxigenic E coli (ETEC) NOT DETECTED NOT DETECTED Final   Shiga like toxin producing E coli (STEC) NOT DETECTED NOT DETECTED Final   Shigella/Enteroinvasive E coli (EIEC) NOT DETECTED NOT DETECTED Final   Cryptosporidium NOT DETECTED NOT DETECTED Final   Cyclospora cayetanensis NOT DETECTED NOT DETECTED Final   Entamoeba histolytica NOT DETECTED NOT DETECTED Final   Giardia lamblia NOT DETECTED NOT DETECTED Final   Adenovirus F40/41 NOT DETECTED NOT DETECTED Final   Astrovirus NOT DETECTED NOT DETECTED Final   Norovirus GI/GII NOT DETECTED NOT DETECTED Final    Rotavirus A NOT  DETECTED NOT DETECTED Final   Sapovirus (I, II, IV, and V) NOT DETECTED NOT DETECTED Final    Comment: Performed at [facility], [address] [place], [place] [address]    Today   Subjective    Relena Marter feels much improved since admission.  Feels ready to go home.  Denies chest pain, shortness of breath or abdominal pain.  Feels they can take care of themselves with the resources they have at home.  Objective   Blood pressure (!) 156/61, pulse 78, temperature 98 F (36.7 C), temperature source Oral, resp. rate 18, height _0  (1.549 m), weight 83.9 kg, SpO2 93 %.   Intake/Output Summary (Last 24 hours) at 12/31/2019 1737 Last data filed at 12/31/2019 1403 Gross per 24 hour  Intake 1622.82 ml  Output 10 ml  Net 1612.82 ml    Exam General: Patient appears well and in good spirits sitting up in bed in no acute distress.  She is walking around fully dressed and eager to go home. Eyes: sclera anicteric, conjuctiva mild injection bilaterally CVS: S1-S2, regular  Respiratory:  decreased air entry bilaterally secondary to decreased inspiratory effort, rales at bases  GI: NABS, soft, NT  LE: No edema.  Neuro: A/O x 3, Moving all extremities equally with normal strength, CN 3-12 intact, grossly nonfocal.  Psych: patient is logical and coherent, judgement and insight appear normal, mood and affect appropriate to situation.    Data Review   CBC w Diff:  Lab Results  Component Value Date   WBC 6.9 12/31/2019   HGB 10.0 (L) 12/31/2019   HGB 12.9 11/21/2015   HCT 29.6 (L) 12/31/2019   HCT 39.7 11/21/2015   PLT 193 12/31/2019   PLT 226 11/21/2015   LYMPHOPCT 14 07/23/2018   LYMPHOPCT 10.9 10/12/2014   MONOPCT 8.0 11/30/2019   MONOPCT 5.2 10/12/2014   EOSPCT 3.3 11/30/2019   EOSPCT 0.5 10/12/2014   BASOPCT 0.9 11/30/2019   BASOPCT 0.4 10/12/2014    CMP:  Lab Results  Component Value Date   NA 134 (L) 12/31/2019   NA 133 (L) 03/07/2016    NA 135 10/12/2014   K 4.8 12/31/2019   K 4.3 10/12/2014   CL 103 12/31/2019   CL 109 10/12/2014   CO2 22 12/31/2019   CO2 24 10/12/2014   BUN 14 12/31/2019   BUN 18 03/07/2016   BUN 39 (H) 10/12/2014   CREATININE 0.76 12/31/2019   CREATININE 0.81 11/30/2019   PROT 6.2 (L) 12/26/2019   PROT 6.7 11/21/2015   PROT 6.3 (L) 10/11/2014   ALBUMIN 3.6 12/26/2019   ALBUMIN 4.1 11/21/2015   ALBUMIN 3.1 (L) 10/11/2014   BILITOT 0.7 12/26/2019   BILITOT 0.2 11/21/2015   BILITOT 0.4 10/11/2014   ALKPHOS 70 12/26/2019   ALKPHOS 66 10/11/2014   AST 15 12/26/2019   AST 21 10/11/2014   ALT 11 12/26/2019   ALT 13 (L) 10/11/2014  .   Total Time in preparing paper work, data evaluation and todays exam - 35 minutes  Vashti Hey M.D on 12/31/2019 at 5:37 PM  [facility]   Office  [phone]

## 2019-12-31 NOTE — Care Management Important Message (Signed)
Important Message  Patient Details  Name: Andrea Holden MRN: [id number] Date of Birth: 04/21/1946   Medicare Important Message Given:  Yes     Olegario Messier A Sheppard Luckenbach 12/31/2019, 11:06 AM

## 2019-12-31 NOTE — Anesthesia Postprocedure Evaluation (Signed)
Anesthesia Post Note  Patient: Andrea Holden  Procedure(s) Performed: COLONOSCOPY (N/A )  Patient location during evaluation: Endoscopy Anesthesia Type: General Level of consciousness: awake and alert Pain management: pain level controlled Vital Signs Assessment: post-procedure vital signs reviewed and stable Respiratory status: spontaneous breathing, nonlabored ventilation, respiratory function stable and patient connected to nasal cannula oxygen Cardiovascular status: blood pressure returned to baseline and stable Postop Assessment: no apparent nausea or vomiting Anesthetic complications: no   No complications documented.   Last Vitals:  Vitals:   12/30/19 2223 12/31/19 0010  BP:  (!) 127/49  Pulse:  63  Resp:  18  Temp:    SpO2: 98% 99%    Last Pain:  Vitals:   12/31/19 0121  TempSrc:   PainSc: 0-No pain                 Lenard Simmer

## 2019-12-31 NOTE — H&P (Signed)
[facility] History & Physical Update  The patient was interviewed and re-examined.  The patient's previous History and Physical has been reviewed and is unchanged.  There is no change in the plan of care. We plan to proceed with the scheduled procedure.  Festus Barren, MD  12/31/2019, 12:42 PM

## 2019-12-31 NOTE — Consult Note (Signed)
[facility] Vascular Consult Note  MRN : [id number]  Andrea Holden is a 74 y.o. (12/07/1945) female who presents with chief complaint of  Chief Complaint  Patient presents with  . Rectal Bleeding   History of Present Illness:  Andrea Holden is a 74 y.o. female with medical history significant for diabetes mellitus, aortic stenosis, depression and anemia who  presents to the ER for evaluation of rectal bleeding for the last 2 days.  Patient states that her symptoms initially started with nausea, vomiting and diarrhea and then she developed lower abdominal cramping mostly in the left lower quadrant with loose bloody stools. She states that she has had about 5 episodes. She denies any recent antibiotic use and has not had any fever or chills. Rectal exam per nurse practitioner in the ER showed bright red blood.  She had a CT scan of abdomen and pelvis done which showed colitis involving the descending and proximal sigmoid colon, question infection versus inflammatory bowel disease, ischemia considered less likely. No evidence of perforation or abscess.  CTA of the abdomen and pelvis conducted on December 12, 2019 which was notable for: 1. Improving colitis at the left hemicolon. 2. Stenoses of the SMA, celiac axis common IMA which can predispose the patient to mesenteric ischemia. 3. Small right-sided pleural effusion with adjacent atelectasis. 4. Additional chronic findings as detailed above. 5.  Aortic Atherosclerosis (ICD10-I70.0).  Vascular surgery was consulted by Dr. Luberta Robertson for possible endovascular intervention in regard to the patient's mesenteric disease.   Current Facility-Administered Medications  Medication Dose Route Frequency Provider Last Rate Last Admin  . 0.9 %  sodium chloride infusion   Intravenous Continuous Lynn Ito, MD 75 mL/hr at 12/31/19 0009 New Bag at 12/31/19 0009  . 0.9 %  sodium chloride infusion   Intravenous Continuous  Maxi Carreras A, PA-C      . acetaminophen (TYLENOL) tablet 650 mg  650 mg Oral Q6H PRN Agbata, Tochukwu, MD      . ascorbic acid (VITAMIN C) tablet 1,000 mg  1,000 mg Oral Daily Agbata, Tochukwu, MD   1,000 mg at 12/30/19 1036  . atorvastatin (LIPITOR) tablet 10 mg  10 mg Oral QHS Agbata, Tochukwu, MD   10 mg at 12/30/19 2132  . [START ON 01/01/2020] ceFAZolin (ANCEF) IVPB 2g/100 mL premix  2 g Intravenous Once Itzel Mckibbin A, PA-C      . cholecalciferol (VITAMIN D) tablet 10,000 Units  10,000 Units Oral Daily Agbata, Tochukwu, MD   10,000 Units at 12/30/19 1037  . diphenhydrAMINE (BENADRYL) injection 50 mg  50 mg Intravenous Once PRN Rashawd Laskaris A, PA-C      . famotidine (PEPCID) tablet 40 mg  40 mg Oral Once PRN Mairlyn Tegtmeyer A, PA-C      . fluticasone (FLONASE) 50 MCG/ACT nasal spray 2 spray  2 spray Each Nare Daily PRN Agbata, Tochukwu, MD      . gabapentin (NEURONTIN) capsule 400 mg  400 mg Oral BID Agbata, Tochukwu, MD   400 mg at 12/30/19 2132  . HYDROmorphone (DILAUDID) injection 1 mg  1 mg Intravenous Once PRN Aurelie Dicenzo A, PA-C      . insulin aspart (novoLOG) injection 0-15 Units  0-15 Units Subcutaneous TID WC Agbata, Tochukwu, MD      . ipratropium-albuterol (DUONEB) 0.5-2.5 (3) MG/3ML nebulizer solution 3 mL  3 mL Nebulization TID PRN Agbata, Tochukwu, MD   3 mL at 12/30/19 2223  . methylPREDNISolone sodium succinate (SOLU-MEDROL)  125 mg/2 mL injection 125 mg  125 mg Intravenous Once PRN Amiracle Neises A, PA-C      . midazolam (VERSED) 2 MG/ML syrup 8 mg  8 mg Oral Once PRN Arad Burston A, PA-C      . montelukast (SINGULAIR) tablet 10 mg  10 mg Oral QHS Agbata, Tochukwu, MD   10 mg at 12/30/19 2132  . multivitamin with minerals tablet 1 tablet  1 tablet Oral Daily Agbata, Tochukwu, MD   1 tablet at 12/30/19 1036  . nicotine (NICODERM CQ - dosed in mg/24 hours) patch 21 mg  21 mg Transdermal Daily Agbata, Tochukwu, MD   21 mg at 12/30/19  1037  . ondansetron (ZOFRAN) tablet 4 mg  4 mg Oral Q6H PRN Agbata, Tochukwu, MD       Or  . ondansetron (ZOFRAN) injection 4 mg  4 mg Intravenous Q6H PRN Agbata, Tochukwu, MD      . ondansetron (ZOFRAN) injection 4 mg  4 mg Intravenous Q6H PRN Maison Kestenbaum A, PA-C      . oxyCODONE (Oxy IR/ROXICODONE) immediate release tablet 5 mg  5 mg Oral Q6H PRN Jimmye Norman, NP      . pantoprazole (PROTONIX) injection 40 mg  40 mg Intravenous Q24H Agbata, Tochukwu, MD   40 mg at 12/30/19 1712  . sertraline (ZOLOFT) tablet 100 mg  100 mg Oral Daily Agbata, Tochukwu, MD   100 mg at 12/30/19 1036  . terbinafine (LAMISIL) tablet 250 mg  250 mg Oral Daily Agbata, Tochukwu, MD   250 mg at 12/30/19 1037  . umeclidinium-vilanterol (ANORO ELLIPTA) 62.5-25 MCG/INH 1 puff  1 puff Inhalation Daily Agbata, Tochukwu, MD   1 puff at 12/31/19 0934   Past Medical History:  Diagnosis Date  . Allergy    seasonal allergies  . Anemia 2014   needed 5 units of blood d/t passing out, weak  . Anxiety   . Aortic stenosis 12/24/2017   Echo Aug 2018  . Arthritis   . Asthma    allergy induced asthma  . Cardiac murmur 12/12/2017  . Cataract   . Cataract    left  . Complication of anesthesia    arrhythmia following colonoscopy  . Degenerative disc disease, lumbar   . Degenerative disc disease, lumbar   . Depression   . Diabetes mellitus   . Diabetic neuropathy (HCC) 11/16/2015  . Dysrhythmia    stenosis of left ventricle  . GERD (gastroesophageal reflux disease)   . H/O transfusion    patient was given 5 units of blood while at [facility], blood type O+  . History of chicken pox   . History of hiatal hernia   . History of measles, mumps, or rubella   . HOH (hard of hearing)    does not use hearing aides yet  . Hyperlipidemia   . Hypertension   . Irregular heartbeat   . LVH (left ventricular hypertrophy) 12/24/2017   Echo Aug 2018  . Neuropathy   . Opiate use 11/16/2015  . Peripheral arterial  disease (HCC) 05/06/2018   At rest, left > right; refer to vasc  . Pes planus of both feet 11/16/2015  . Plantar fasciitis of right foot 11/16/2015  . Pneumonia   . Wheezing    Past Surgical History:  Procedure Laterality Date  . ABDOMINAL HYSTERECTOMY    . APPLICATION OF WOUND VAC Left 07/23/2018   Procedure: APPLICATION OF WOUND VAC;  Surgeon: Annice Needy, MD;  Location: [facility]  ORS;  Service: Vascular;  Laterality: Left;  . banck injections    . CATARACT EXTRACTION W/PHACO Right 05/30/2015   Procedure: CATARACT EXTRACTION PHACO AND INTRAOCULAR LENS PLACEMENT (IOC);  Surgeon: Galen Manila, MD;  Location: [facility] ORS;  Service: Ophthalmology;  Laterality: Right;  [place] 00:57 AP% 20.9 CDE 11.99 fluid pack lot #[id number]  . CATARACT EXTRACTION W/PHACO Left 11/11/2019   Procedure: CATARACT EXTRACTION PHACO AND INTRAOCULAR LENS PLACEMENT (IOC) LEFT DIABETIC;  Surgeon: Galen Manila, MD;  Location: [facility] ORS;  Service: Ophthalmology;  Laterality: Left;  Lot #[id number] H [place]: 00:47.5 CDE: 6.24  . CHOLECYSTECTOMY  1970  . COLON SURGERY  2013   blocked colon  . COLONOSCOPY N/A 12/29/2019   Procedure: COLONOSCOPY;  Surgeon: Pasty Spillers, MD;  Location: [facility] ENDOSCOPY;  Service: Endoscopy;  Laterality: N/A;  . COLONOSCOPY WITH PROPOFOL N/A 01/20/2018   Procedure: COLONOSCOPY WITH PROPOFOL;  Surgeon: Midge Minium, MD;  Location[facility];  Service: Endoscopy;  Laterality: N/A;  . ENDARTERECTOMY FEMORAL Left 07/08/2018   Procedure: ENDARTERECTOMY FEMORAL;  Surgeon: Annice Needy, MD;  Location: [facility] ORS;  Service: Vascular;  Laterality: Left;  . EYE SURGERY Right    cataract surgery  . FOOT FUSION Right 2018   metal in foot  . GALLBLADDER SURGERY  1970  . GASTRIC BYPASS  2010   lost 178 lbs and regained 40 lbs last few years  . HUMERUS FRACTURE SURGERY Right    metal plate with screws  . internal bleeding  2016   ulcer in past  . LOWER EXTREMITY ANGIOGRAM Left 07/08/2018   Procedure:  LOWER EXTREMITY ANGIOGRAM;  Surgeon: Annice Needy, MD;  Location: [facility] ORS;  Service: Vascular;  Laterality: Left;  . LOWER EXTREMITY ANGIOGRAPHY Left 05/27/2018   Procedure: LOWER EXTREMITY ANGIOGRAPHY;  Surgeon: Annice Needy, MD;  Location: [facility] INVASIVE CV LAB;  Service: Cardiovascular;  Laterality: Left;  . LOWER EXTREMITY ANGIOGRAPHY Right 12/07/2018   Procedure: LOWER EXTREMITY ANGIOGRAPHY;  Surgeon: Annice Needy, MD;  Location: [facility] INVASIVE CV LAB;  Service: Cardiovascular;  Laterality: Right;  . LOWER EXTREMITY ANGIOGRAPHY Left 05/10/2019   Procedure: LOWER EXTREMITY ANGIOGRAPHY;  Surgeon: Annice Needy, MD;  Location: [facility] INVASIVE CV LAB;  Service: Cardiovascular;  Laterality: Left;  . MOUTH SURGERY     root canals and crowns and extractions  . MOUTH SURGERY  09/30/2019  . OVARY SURGERY    . SKIN GRAFT Right 2018   RT foot. foot has been rebuilt.  it is full of metal  . TENOTOMY ACHILLES TENDON Right    Percuntaneous. metal in foot  . TONSILLECTOMY    . WOUND DEBRIDEMENT Left 07/23/2018   Procedure: DEBRIDEMENT WOUND;  Surgeon: Annice Needy, MD;  Location: [facility] ORS;  Service: Vascular;  Laterality: Left;   Social History Social History   Tobacco Use  . Smoking status: Current Some Day Smoker    Packs/day: 0.50    Years: 55.00    Pack years: 27.50    Types: Cigarettes    Start date: 07/01/1960    Last attempt to quit: 11/22/2018    Years since quitting: 1.1  . Smokeless tobacco: Never Used  . Tobacco comment: trying to quit again, using chantix  Vaping Use  . Vaping Use: Never used  Substance Use Topics  . Alcohol use: Yes    Alcohol/week: 0.0 standard drinks    Comment: 2 drinks per month  . Drug use: Not Currently    Types: Marijuana  Comment: used for pain   Family History Family History  Problem Relation Age of Onset  . Asthma Mother   . COPD Mother   . Arthritis Father   . Depression Father   . Heart disease Father   . Hypertension Father   . Stroke Father    . Heart attack Father   . Arthritis Brother   . Depression Brother   . Diabetes Brother   . Heart disease Brother   . Hyperlipidemia Brother   . Hypertension Brother   . Stroke Brother   . Vision loss Brother   . Heart attack Brother   . Diabetes Maternal Grandmother   . Thyroid disease Daughter   . Colitis Daughter   . Breast cancer Neg Hx   Denies family history peripheral artery disease, venous disease or renal disease.  Allergies  Allergen Reactions  . Meloxicam Other (See Comments)    GI bleeding  . Nsaids Other (See Comments)    Gi bleeding  . Sitagliptin Hives and Itching    Januvia    REVIEW OF SYSTEMS (Negative unless checked)  Constitutional: [] Weight loss  [] Fever  [] Chills Cardiac: [] Chest pain   [] Chest pressure   [] Palpitations   [] Shortness of breath when laying flat   [] Shortness of breath at rest   [] Shortness of breath with exertion. Vascular:  [] Pain in legs with walking   [] Pain in legs at rest   [] Pain in legs when laying flat   [] Claudication   [] Pain in feet when walking  [] Pain in feet at rest  [] Pain in feet when laying flat   [] History of DVT   [] Phlebitis   [] Swelling in legs   [] Varicose veins   [] Non-healing ulcers Pulmonary:   [] Uses home oxygen   [] Productive cough   [] Hemoptysis   [] Wheeze  [] COPD   [] Asthma Neurologic:  [] Dizziness  [] Blackouts   [] Seizures   [] History of stroke   [] History of TIA  [] Aphasia   [] Temporary blindness   [] Dysphagia   [] Weakness or numbness in arms   [] Weakness or numbness in legs Musculoskeletal:  [] Arthritis   [] Joint swelling   [] Joint pain   [] Low back pain Hematologic:  [] Easy bruising  [] Easy bleeding   [] Hypercoagulable state   [] Anemic  [] Hepatitis Gastrointestinal:  [] Blood in stool   [] Vomiting blood  [] Gastroesophageal reflux/heartburn   [] Difficulty swallowing. Genitourinary:  [] Chronic kidney disease   [] Difficult urination  [] Frequent urination  [] Burning with urination   [] Blood in urine Skin:   [] Rashes   [] Ulcers   [] Wounds Psychological:  [] History of anxiety   []  History of major depression.  Positive for abdominal pain, diarrhea  Physical Examination  Vitals:   12/30/19 1620 12/30/19 2223 12/31/19 0010 12/31/19 0730  BP: (!) 133/43  (!) 127/49 (!) 144/57  Pulse: (!) 54  63 64  Resp: 18  18 16   Temp: 98.3 F (36.8 C)   98.1 F (36.7 C)  TempSrc: Oral   Oral  SpO2: 97% 98% 99% 97%  Weight:      Height:       Body mass index is 34.96 kg/m. Gen:  WD/WN, NAD Head: [place]/AT, No temporalis wasting. Prominent temp pulse not noted. Ear/Nose/Throat: Hearing grossly intact, nares w/o erythema or drainage, oropharynx w/o Erythema/Exudate Eyes: Sclera non-icteric, conjunctiva clear Neck: Trachea midline.  No JVD.  Pulmonary:  Good air movement, respirations not labored, equal bilaterally.  Cardiac: RRR, normal S1, S2. Vascular:  Vessel Right Left  Radial Palpable Palpable  Ulnar  Palpable Palpable  Brachial Palpable Palpable  Carotid Palpable, without bruit Palpable, without bruit  Aorta Not palpable N/A  Femoral Palpable Palpable  Popliteal Palpable Palpable  PT Palpable Palpable  DP Palpable Palpable   Gastrointestinal: soft, non-tender/non-distended. No guarding/reflex.  Musculoskeletal: M/S 5/5 throughout.  Extremities without ischemic changes.  No deformity or atrophy. No edema. Neurologic: Sensation grossly intact in extremities.  Symmetrical.  Speech is fluent. Motor exam as listed above. Psychiatric: Judgment intact, Mood & affect appropriate for pt's clinical situation. Dermatologic: No rashes or ulcers noted.  No cellulitis or open wounds. Lymph : No Cervical, Axillary, or Inguinal lymphadenopathy.  CBC Lab Results  Component Value Date   WBC 6.9 12/31/2019   HGB 10.0 (L) 12/31/2019   HCT 29.6 (L) 12/31/2019   MCV 81.5 12/31/2019   PLT 193 12/31/2019   BMET    Component Value Date/Time   NA 134 (L) 12/31/2019 0651   NA 133 (L) 03/07/2016 1317   NA  135 10/12/2014 0516   K 4.8 12/31/2019 0651   K 4.3 10/12/2014 0516   CL 103 12/31/2019 0651   CL 109 10/12/2014 0516   CO2 22 12/31/2019 0651   CO2 24 10/12/2014 0516   GLUCOSE 122 (H) 12/31/2019 0651   GLUCOSE 146 (H) 10/12/2014 0516   BUN 14 12/31/2019 0651   BUN 18 03/07/2016 1317   BUN 39 (H) 10/12/2014 0516   CREATININE 0.76 12/31/2019 0651   CREATININE 0.81 11/30/2019 1240   CALCIUM 8.7 (L) 12/31/2019 0651   CALCIUM 7.6 (L) 10/12/2014 0516   GFRNONAA >60 12/31/2019 0651   GFRNONAA 73 11/30/2019 1240   GFRAA >60 12/31/2019 0651   GFRAA 84 11/30/2019 1240   Estimated Creatinine Clearance: 62.4 mL/min (by C-G formula based on SCr of 0.76 mg/dL).  COAG Lab Results  Component Value Date   INR 1.03 07/23/2018   INR 0.91 06/29/2018   Radiology CT ABDOMEN PELVIS W CONTRAST  Result Date: 12/26/2019 CLINICAL DATA:  Acute abdominal pain, LEFT lower quadrant tenderness, leukocytosis, bright red blood per rectum; history diabetes mellitus, hypertension, smoker coronary artery disease EXAM: CT ABDOMEN AND PELVIS WITH CONTRAST TECHNIQUE: Multidetector CT imaging of the abdomen and pelvis was performed using the standard protocol following bolus administration of intravenous contrast. Sagittal and coronal MPR images reconstructed from axial data set. CONTRAST:  75mL OMNIPAQUE IOHEXOL 300 MG/ML SOLN IV. No oral contrast. COMPARISON:  None FINDINGS: Lower chest: Lung bases clear Hepatobiliary: Gallbladder surgically absent. Low-attenuation focus LEFT lobe liver 8 mm greatest size likely tiny cyst. Remainder of liver unremarkable Pancreas: Atrophic pancreas without mass Spleen: Normal appearance Adrenals/Urinary Tract: Adrenal glands normal appearance. Tiny nonobstructing LEFT renal calculus. Kidneys, ureters, and bladder otherwise normal appearance. Stomach/Bowel: Appendix not visualized. Wall thickening of the descending and proximal sigmoid colon consistent with colitis. Associated  infiltration of surrounding mesocolon. No colonic diverticula identified. No evidence of perforation or abscess. Prior bariatric surgery. Stomach and bowel loops otherwise unremarkable. Vascular/Lymphatic: Atherosclerotic calcifications aorta, visceral arteries, iliac arteries, coronary arteries. Aorta normal caliber. No adenopathy. Multiple pelvic phleboliths. Reproductive: Uterus surgically absent. Nonvisualization of ovaries. Other: No free air or free fluid. Musculoskeletal: Multilevel degenerative disc and facet disease changes lumbar spine. IMPRESSION: Colitis involving the descending and proximal sigmoid colon, question infection versus inflammatory bowel disease, ischemia considered less likely. No evidence of perforation or abscess. Tiny nonobstructing LEFT renal calculus. Aortic Atherosclerosis (ICD10-I70.0). Electronically Signed   By: Ulyses SouthwardMark  Boles M.D.   On: 12/26/2019 15:33   CT Angio  Abd/Pel w/ and/or w/o  Result Date: 12/29/2019 CLINICAL DATA:  Rectal bleeding for the past 2 days. Recent CT demonstrated findings suggestive of colitis of the sigmoid colon. There is concern for ischemic colitis. EXAM: CTA ABDOMEN AND PELVIS WITHOUT AND WITH CONTRAST TECHNIQUE: Multidetector CT imaging of the abdomen and pelvis was performed using the standard protocol during bolus administration of intravenous contrast. Multiplanar reconstructed images and MIPs were obtained and reviewed to evaluate the vascular anatomy. CONTRAST:  OMNIPAQUE IOHEXOL 350 MG/ML SOLN COMPARISON:  December 26, 2019 FINDINGS: VASCULAR Aorta: There are atherosclerotic changes throughout the visualized abdominal aorta without evidence for an aneurysm or dissection Celiac: There is a high-grade stenosis at the origin of the celiac axis. SMA: There is a moderate grade stenosis at the origin of the SMA. There is mild atherosclerotic disease throughout the remaining portions of the SMA without evidence for high-grade stenosis. Renals: There  are 2 left renal arteries. There is a single right renal artery. Atherosclerotic changes are noted at the origin of these arteries resulting in moderate stenosis. IMA: There is moderate stenosis at the origin of the IMA. Inflow: There are atherosclerotic changes of the iliac vasculature without evidence for high-grade stenosis. Proximal Outflow: There is a patent stent within the left SFA. There is moderate stenosis at the origin of the left SFA. There are atherosclerotic changes throughout the right SFA likely resulting in mild to moderate multifocal stenosis. Veins: No obvious venous abnormality within the limitations of this arterial phase study. Review of the MIP images confirms the above findings. NON-VASCULAR Lower chest: There is a trace right-sided pleural effusion.There is atelectasis at the right lung base. Hepatobiliary: The liver is normal. Status post cholecystectomy.The common bile duct is dilated but tapers towards the head of the pancreas. This is likely secondary to a reservoir effect status post cholecystectomy. Pancreas: Normal contours without ductal dilatation. No peripancreatic fluid collection. Spleen: The spleen is enlarged measuring 13 cm craniocaudad. Adrenals/Urinary Tract: --Adrenal glands: Unremarkable. --Right kidney/ureter: No hydronephrosis or radiopaque kidney stones. --Left kidney/ureter: No hydronephrosis or radiopaque kidney stones. --Urinary bladder: Unremarkable. Stomach/Bowel: --Stomach/Duodenum: There is diffuse circumferential wall thickening of the visualized portions of the esophagus. The patient is status post prior gastric bypass. --Small bowel: There is a small bowel surgical anastomosis in the left mid abdomen. This anastomosis appears patent. --Colon: Again noted are findings of colitis involving the descending colon. This has improved since the prior study. There are few scattered colonic diverticula without CT evidence for diverticulitis. --Appendix: Normal.  Lymphatic: --No retroperitoneal lymphadenopathy. --No mesenteric lymphadenopathy. --No pelvic or inguinal lymphadenopathy. Reproductive: Status post hysterectomy. No adnexal mass. Other: There is no free air. There is a trace amount of free fluid the patient's pelvis, felt to be reactive in etiology. Musculoskeletal. No acute displaced fractures. IMPRESSION: 1. Improving colitis at the left hemicolon. 2. Stenoses of the SMA, celiac axis common IMA which can predispose the patient to mesenteric ischemia. 3. Small right-sided pleural effusion with adjacent atelectasis. 4. Additional chronic findings as detailed above. 5.  Aortic Atherosclerosis (ICD10-I70.0). Electronically Signed   By: Katherine Mantle M.D.   On: 12/29/2019 21:18   Assessment/Plan The patient is a 74 year old female who was admitted for colitis and found to have mesenteric artery disease  1.  Mesenteric artery disease: Stenosis of the celiac, SMA and IMA found on recent CTA of the abdomen pelvis.  In the setting of colitis would recommend the patient undergo a mesenteric angiogram in attempt to assess the  patient's anatomy and contributing degree of atherosclerotic disease.  If appropriate, an attempt to revascularize the mesenteric can be made at that time.  Procedure, risks and benefits explained to the patient.  All questions answered.  The patient wished to proceed.  We will plan on this this afternoon with Dr. Wyn Quaker  2. PAD: Patient is known to [place] as we have followed her peripheral artery disease in the past.  At this time she is asymptomatic. On Plavix and statin for medical management.  Please continue taking the disease.  3.  Diabetes: On appropriate medications. Encouraged good control as its slows the progression of atherosclerotic disease  4.  Nicotine dependence: We had a discussion for approximately three minutes regarding the absolute need for smoking cessation due to the deleterious nature of tobacco on the vascular  system. We discussed the tobacco use would diminish patency of any intervention, and likely significantly worsen progression of disease. We discussed multiple agents for quitting including replacement therapy or medications to reduce cravings such as Chantix. The patient voices their understanding of the importance of smoking cessation.  Discussed with Dr. Weldon Inches, PA-C  12/31/2019 11:42 AM  This note was created with Dragon medical transcription system.  Any error is purely unintentional

## 2019-12-31 NOTE — Anesthesia Postprocedure Evaluation (Signed)
Anesthesia Post Note  Patient: Andrea Holden  Procedure(s) Performed: VISCERAL ANGIOGRAPHY (N/A )  Patient location during evaluation: PACU Anesthesia Type: General Level of consciousness: awake and alert and oriented Pain management: pain level controlled Vital Signs Assessment: post-procedure vital signs reviewed and stable Respiratory status: spontaneous breathing Cardiovascular status: blood pressure returned to baseline Anesthetic complications: no   No complications documented.   Last Vitals:  Vitals:   12/31/19 1430 12/31/19 1445  BP: (!) 99/57 129/66  Pulse: 70 64  Resp: 13 12  Temp:    SpO2: 98% 98%    Last Pain:  Vitals:   12/31/19 1445  TempSrc:   PainSc: Asleep                 Michole Lecuyer

## 2019-12-31 NOTE — Transfer of Care (Signed)
Immediate Anesthesia Transfer of Care Note  Patient: Andrea Holden  Procedure(s) Performed: VISCERAL ANGIOGRAPHY (N/A )  Patient Location: PACU  Anesthesia Type:General  Level of Consciousness: awake, alert  and oriented  Airway & Oxygen Therapy: Patient Spontanous Breathing and Patient connected to face mask oxygen  Post-op Assessment: Report given to RN and Post -op Vital signs reviewed and stable  Post vital signs: Reviewed and stable  Last Vitals:  Vitals Value Taken Time  BP    Temp    Pulse    Resp    SpO2      Last Pain:  Vitals:   12/31/19 1148  TempSrc: Oral  PainSc: 0-No pain      Patients Stated Pain Goal: 0 (12/27/19 0200)  Complications: No complications documented.

## 2019-12-31 NOTE — Anesthesia Procedure Notes (Signed)
Procedure Name: LMA Insertion Date/Time: 12/31/2019 12:45 PM Performed by: Junious Silk, CRNA Pre-anesthesia Checklist: Patient identified, Patient being monitored, Timeout performed, Emergency Drugs available and Suction available Patient Re-evaluated:Patient Re-evaluated prior to induction Oxygen Delivery Method: Circle system utilized Preoxygenation: Pre-oxygenation with 100% oxygen Induction Type: IV induction Ventilation: Mask ventilation without difficulty LMA: LMA inserted LMA Size: 4.0 Tube type: Oral Number of attempts: 1 Placement Confirmation: positive ETCO2 and breath sounds checked- equal and bilateral Tube secured with: Tape Dental Injury: Teeth and Oropharynx as per pre-operative assessment

## 2020-01-01 ENCOUNTER — Telehealth: Payer: Self-pay | Admitting: Pharmacist

## 2020-01-01 DIAGNOSIS — Z720 Tobacco use: Secondary | ICD-10-CM

## 2020-01-01 NOTE — Telephone Encounter (Cosign Needed)
Hello Andrea Holden,  This patient was just discharged from the hospital and would like to start Chantix as soon as possible to maintain cessation.  Please send 1 Starter Pack and 1 Continuing Pack to [facility] (default).  Special Instructions: She can't open the Starter Pack so please have the pharmacist open the foil and place 0.5mg  tablets into one vial and 1 mg tablets into another vial.  We will handle PAP when she provides her proof of income.  Thanks,  Colbert Coyer, PharmD, [place], CTTS Clinical Pharmacist [facility] [phone]

## 2020-01-04 ENCOUNTER — Telehealth (INDEPENDENT_AMBULATORY_CARE_PROVIDER_SITE_OTHER): Payer: Self-pay

## 2020-01-04 ENCOUNTER — Encounter: Payer: Self-pay | Admitting: Vascular Surgery

## 2020-01-04 ENCOUNTER — Ambulatory Visit: Payer: Self-pay | Admitting: *Deleted

## 2020-01-04 ENCOUNTER — Ambulatory Visit (INDEPENDENT_AMBULATORY_CARE_PROVIDER_SITE_OTHER): Payer: Medicare (Managed Care) | Admitting: *Deleted

## 2020-01-04 DIAGNOSIS — Z599 Problem related to housing and economic circumstances, unspecified: Secondary | ICD-10-CM

## 2020-01-04 DIAGNOSIS — F3341 Major depressive disorder, recurrent, in partial remission: Secondary | ICD-10-CM

## 2020-01-04 DIAGNOSIS — E782 Mixed hyperlipidemia: Secondary | ICD-10-CM

## 2020-01-04 DIAGNOSIS — Z789 Other specified health status: Secondary | ICD-10-CM

## 2020-01-04 NOTE — Patient Instructions (Signed)
Thank you allowing the Chronic Care Management Team to be a part of your care! It was a pleasure speaking with you today!  1. Please follow up with [facility] to assist with your housing needs  CCM (Chronic Care Management) Team   [name] RN, BSN Nurse Care Coordinator  [phone]  Andrea Holden [address] LCSW Clinical Social Worker [phone]  Goals Addressed              This Visit's Progress   . - "I need help finding a new place to live": (pt-stated)        CARE PLAN ENTRY (see longitudinal plan of care for additional care plan information)  Current Barriers:  . Housing barriers  Clinical Social Work Clinical Goal(s):  Marland Kitchen Over the next 90 days, patient will work with the [facility] to address needs related to housing needs  Interventions: . Inter-disciplinary care team collaboration (see longitudinal plan of care) . Patient continues to discuss concern about need to move . Patient confirmed receiving a call from [facility] who have agreed to assist her with housing . Per patient, she will go to the [employer] tomorrow to complete the application needed for housing . Patient discussed having an appointment with the annual wellness nurse on 01/06/20 and will bring income statement for the pharmacist at that time . Positive reinforcement provided for plans made to follow up with [employer] , annual wellness visit and the [facility] . This Child psychotherapist completed a brief housing search through [email or web address] before becoming aware of contact made with [employer].  . Discussed plans with patient for ongoing care management follow up regarding housing and  plan with patient to call back later on today to check on her.  Patient Self Care Activities:  . Performs ADL's independently . Calls provider office for new concerns or questions . Unable to perform IADLs independently  Please see  past updates related to this goal by clicking on the "Past Updates" button in the selected goal          The patient verbalized understanding of instructions provided today and declined a print copy of patient instruction materials.   Telephone follow up appointment with care management team member scheduled for: 01/07/20

## 2020-01-04 NOTE — Patient Instructions (Addendum)
Thank you allowing the Chronic Care Management Team to be a part of your care! It was a pleasure speaking with you today!  1. Please continue to utilize positive coping skills to address anxiety and frustrations related to housing 2. Please call the [place] [phone] or 911 with feelings of self harm  CCM (Chronic Care Management) Team   [name] RN, BSN Nurse Care Coordinator  [phone]  Bennetta Rudden [address], LCSW Clinical Social Worker [phone]  Goals Addressed              This Visit's Progress   .  "I need help finding a new place to live": (pt-stated)        CARE PLAN ENTRY (see longitudinal plan of care for additional care plan information)  Current Barriers:  . Housing barriers  Clinical Social Work Clinical Goal(s):  Marland Kitchen Over the next 90 days, patient will work with the [facility] to address needs related to housing needs  Interventions: . Inter-disciplinary care team collaboration (see longitudinal plan of care) . Patient continues to discuss concern about need to move and having no prospects . Patient discussed contacting Kennon Rounds form [address] who provided her with some of the same resources already received . Patient very frustrated and upset regarding her recent hospitalization and now need for housing by March 31, 2020. Marland Kitchen Patient allowed to vent her frustrations, emotional support provided . Suicide Risk assessed-Patient denied plan for  harm self, stating that she has people in her life that depend on her . Positive coping strategies discussed, phone number to the [facility] in case of thoughts of self harm begin to surface [phone] or 911 . Contact information provided for patient experience [phone] to discuss experience during recent hospitalization . Consent received to complete referral for [facility] . Discussed plans with patient for ongoing care management follow up regarding housing and  discussed plan with patient to call back later on today to check on her.  Patient Self Care Activities:  . Performs ADL's independently . Calls provider office for new concerns or questions . Unable to perform IADLs independently  Please see past updates related to this goal by clicking on the "Past Updates" button in the selected goal          The patient verbalized understanding of instructions provided today and declined a print copy of patient instruction materials.   Telephone follow up appointment with care management team member scheduled for:  01/07/20

## 2020-01-04 NOTE — Chronic Care Management (AMB) (Signed)
Chronic Care Management    Clinical Social Work Follow Up Note  01/04/2020 Name: Andrea Holden MRN: [id number] DOB: Nov 19, 1945  Andrea Holden is a 74 y.o. year old female who is a primary care patient of [name], [place]. The CCM team was consulted for assistance with housing  Concerns.   Review of patient status, including review of consultants reports, other relevant assessments, and collaboration with appropriate care team members and the patient's provider was performed as part of comprehensive patient evaluation and provision of chronic care management services.    SDOH (Social Determinants of Health) assessments performed: No    Outpatient Encounter Medications as of 01/04/2020  Medication Sig  . acetaminophen (TYLENOL) 325 MG tablet Take 2 tablets (650 mg total) by mouth every 6 (six) hours as needed for mild pain, moderate pain, headache or fever.  Marland Kitchen albuterol (VENTOLIN HFA) 108 (90 Base) MCG/ACT inhaler Inhale 2 puffs into the lungs every 6 (six) hours as needed for wheezing or shortness of breath.  Marland Kitchen atorvastatin (LIPITOR) 10 MG tablet Take 1 tablet (10 mg total) by mouth at bedtime.  . Cholecalciferol (VITAMIN D3) 250 MCG (10000 UT) TABS Take 10,000 Units by mouth daily.  . clopidogrel (PLAVIX) 75 MG tablet Take 1 tablet (75 mg total) by mouth daily.  . fluticasone (FLONASE) 50 MCG/ACT nasal spray Place 2 sprays into both nostrils daily as needed. (Patient taking differently: Place 2 sprays into both nostrils daily as needed for allergies. )  . gabapentin (NEURONTIN) 400 MG capsule Take 1 capsule (400 mg total) by mouth 2 (two) times daily.  Marland Kitchen ipratropium-albuterol (DUONEB) 0.5-2.5 (3) MG/3ML SOLN Take 3 mLs by nebulization 3 (three) times daily as needed. (Patient taking differently: Take 3 mLs by nebulization 3 (three) times daily as needed (Shortness of breath). )  . Krill Oil 1000 MG CAPS Take 1,000 mg by mouth daily at 6 (six) AM.   . lisinopril (ZESTRIL) 10 MG tablet  Take 1 tablet (10 mg total) by mouth daily.  . montelukast (SINGULAIR) 10 MG tablet Take 1 tablet (10 mg total) by mouth at bedtime. (Patient not taking: Reported on 11/11/2019)  . Multiple Vitamins-Minerals (MULTIVITAMIN ADULTS 50+) TABS Take 1 tablet by mouth daily.  . pantoprazole (PROTONIX) 20 MG tablet Take 1 tablet (20 mg total) by mouth daily.  Marland Kitchen Respiratory Therapy Supplies (NEBULIZER/TUBING/MOUTHPIECE) KIT Disp one nebulizer machine, tubing set and mouthpiece kit  . sertraline (ZOLOFT) 100 MG tablet TAKE 1 AND 1/2 TABLETS (150 MG TOTAL) BY MOUTH DAILY (Patient taking differently: Take 100 mg by mouth daily. )  . terbinafine (LAMISIL) 250 MG tablet Take 1 tablet (250 mg total) by mouth daily.  Marland Kitchen umeclidinium-vilanterol (ANORO ELLIPTA) 62.5-25 MCG/INH AEPB Inhale 1 puff into the lungs daily. (Patient not taking: Reported on 11/30/2019)  . vitamin C (ASCORBIC ACID) 500 MG tablet Take 1,000 mg by mouth daily.    No facility-administered encounter medications on file as of 01/04/2020.     Goals Addressed              This Visit's Progress   .  "I need help finding a new place to live": (pt-stated)        [place] (see longitudinal plan of care for additional care plan information)  Current Barriers:  . Housing barriers  Clinical Social Work Clinical Goal(s):  Marland Kitchen Over the next 90 days, patient will work with the [facility] to address needs related to housing needs  Interventions: .  Inter-disciplinary care team collaboration (see longitudinal plan of care) . Patient continues to discuss concern about need to move and having no prospects . Patient discussed contacting Gay Filler form [address] who provided her with some of the same resources already received . Patient very frustrated and upset regarding her recent hospitalization and now need for housing by March 31, 2020. Marland Kitchen Patient allowed to vent her frustrations, emotional support provided . Suicide Risk assessed-Patient  denied plan for  harm self, stating that she has people in her life that depend on her . Positive coping strategies discussed, phone number to the [facility] in case of thoughts of self harm begin to surface [phone] or 911 . Contact information provided for patient experience [phone] to discuss experience during recent hospitalization . Consent received to complete referral for [facility] . Discussed plans with patient for ongoing care management follow up regarding housing and discussed plan with patient to call back later on today to check on her.  Patient Self Care Activities:  . Performs ADL's independently . Calls provider office for new concerns or questions . Unable to perform IADLs independently  Please see past updates related to this goal by clicking on the "Past Updates" button in the selected goal          Follow Up Plan: SW will follow up with patient by phone over the next 7-10 business days   Elliot Gurney, [place] Worker  [facility] Care Management [phone]

## 2020-01-04 NOTE — Chronic Care Management (AMB) (Signed)
Chronic Care Management    Clinical Social Work Follow Up Note  01/04/2020 Name: Andrea Holden MRN: [id number] DOB: Nov 01, 1945  Andrea Holden is a 74 y.o. year old female who is a primary care patient of [name], [place]. The CCM team was consulted for assistance with [name] related to Housing.   Review of patient status, including review of consultants reports, other relevant assessments, and collaboration with appropriate care team members and the patient's provider was performed as part of comprehensive patient evaluation and provision of chronic care management services.    SDOH (Social Determinants of Health) assessments performed: No    Outpatient Encounter Medications as of 01/04/2020  Medication Sig  . acetaminophen (TYLENOL) 325 MG tablet Take 2 tablets (650 mg total) by mouth every 6 (six) hours as needed for mild pain, moderate pain, headache or fever.  Marland Kitchen albuterol (VENTOLIN HFA) 108 (90 Base) MCG/ACT inhaler Inhale 2 puffs into the lungs every 6 (six) hours as needed for wheezing or shortness of breath.  Marland Kitchen atorvastatin (LIPITOR) 10 MG tablet Take 1 tablet (10 mg total) by mouth at bedtime.  . Cholecalciferol (VITAMIN D3) 250 MCG (10000 UT) TABS Take 10,000 Units by mouth daily.  . clopidogrel (PLAVIX) 75 MG tablet Take 1 tablet (75 mg total) by mouth daily.  . fluticasone (FLONASE) 50 MCG/ACT nasal spray Place 2 sprays into both nostrils daily as needed. (Patient taking differently: Place 2 sprays into both nostrils daily as needed for allergies. )  . gabapentin (NEURONTIN) 400 MG capsule Take 1 capsule (400 mg total) by mouth 2 (two) times daily.  Marland Kitchen ipratropium-albuterol (DUONEB) 0.5-2.5 (3) MG/3ML SOLN Take 3 mLs by nebulization 3 (three) times daily as needed. (Patient taking differently: Take 3 mLs by nebulization 3 (three) times daily as needed (Shortness of breath). )  . Krill Oil 1000 MG CAPS Take 1,000 mg by mouth daily at 6 (six) AM.   . lisinopril  (ZESTRIL) 10 MG tablet Take 1 tablet (10 mg total) by mouth daily.  . montelukast (SINGULAIR) 10 MG tablet Take 1 tablet (10 mg total) by mouth at bedtime. (Patient not taking: Reported on 11/11/2019)  . Multiple Vitamins-Minerals (MULTIVITAMIN ADULTS 50+) TABS Take 1 tablet by mouth daily.  . pantoprazole (PROTONIX) 20 MG tablet Take 1 tablet (20 mg total) by mouth daily.  Marland Kitchen Respiratory Therapy Supplies (NEBULIZER/TUBING/MOUTHPIECE) KIT Disp one nebulizer machine, tubing set and mouthpiece kit  . sertraline (ZOLOFT) 100 MG tablet TAKE 1 AND 1/2 TABLETS (150 MG TOTAL) BY MOUTH DAILY (Patient taking differently: Take 100 mg by mouth daily. )  . terbinafine (LAMISIL) 250 MG tablet Take 1 tablet (250 mg total) by mouth daily.  Marland Kitchen umeclidinium-vilanterol (ANORO ELLIPTA) 62.5-25 MCG/INH AEPB Inhale 1 puff into the lungs daily. (Patient not taking: Reported on 11/30/2019)  . vitamin C (ASCORBIC ACID) 500 MG tablet Take 1,000 mg by mouth daily.    No facility-administered encounter medications on file as of 01/04/2020.     Goals Addressed              This Visit's Progress   .  "I need help finding a new place to live": (pt-stated)        [name] (see longitudinal plan of care for additional care plan information)  Current Barriers:  . Housing barriers  Clinical Social Work Clinical Goal(s):  Marland Kitchen Over the next 90 days, patient will work with the [facility] to address needs related to housing needs  Interventions: . Inter-disciplinary care team collaboration (see longitudinal plan of care) . Patient continues to discuss concern about need to move . Patient confirmed receiving a call from [facility] who have agreed to assist her with housing . Per patient, she will go to the [employer] tomorrow to complete the application needed for housing . Patient discussed having an appointment with the annual wellness nurse on 01/06/20 and will bring income  statement for the pharmacist at that time . Positive reinforcement provided for plans made to follow up with [employer] , annual wellness visit and the [employer] . This Education officer, museum completed a brief housing search through [email or web address] before becoming aware of contact made with [employer].  . Discussed plans with patient for ongoing care management follow up regarding housing and  plan with patient to call back later on today to check on her.  Patient Self Care Activities:  . Performs ADL's independently . Calls provider office for new concerns or questions . Unable to perform IADLs independently  Please see past updates related to this goal by clicking on the "Past Updates" button in the selected goal          Follow Up Plan: SW will follow up with patient by phone over the next 7-10 business days   Andrea Holden, [place] Worker  [facility] Care Management [phone]

## 2020-01-04 NOTE — Telephone Encounter (Signed)
Patient called in stating no one discussed her discharge with her or called to make sure she was fine. I asked if she was given a paperwork like a discharge summary or any other information when she was discharged. Patient stated she was given a packet of information but she has not looked at it. Patient wanted to know when she had an appt in our office and starting her Plavix again. I looked at the discharge summary and let her know that according to the paperwork she was to restart her Plavix on 01/01/20 and her follow up appt on 03/28/20 at 10:30 am.

## 2020-01-05 MED ORDER — VARENICLINE TARTRATE 1 MG PO TABS: 1.0000 mg | ORAL_TABLET | Freq: Two times a day (BID) | ORAL | 3 refills | Status: DC

## 2020-01-05 MED ORDER — CHANTIX STARTING MONTH PAK 0.5 MG X 11 & 1 MG X 42 PO TABS: ORAL_TABLET | ORAL | 0 refills | Status: AC

## 2020-01-05 NOTE — Telephone Encounter (Signed)
Reviewed CCM clinical pharmacist request - meds sent into pharmacy, CMA staff to assist with f/up with pharmacy about specific and particular instructions.  Please send 1 Starter Pack and 1 Continuing Pack to [facility] (default).  Special Instructions: She can't open the Starter Pack so please have the pharmacist open the foil and place 0.5mg  tablets into one vial and 1 mg tablets into another vial.  We will handle PAP when she provides her proof of income.  Thanks,  Colbert Coyer, PharmD, [place], CTTS Clinical Pharmacist [facility] [phone]    Reviewed- meds sent to pharmacy and CMA asked to place f/up phone call to pharmacy to ensure additional instructions were clear.  1. Currently attempting to quit smoking - varenicline (CHANTIX STARTING MONTH PAK) 0.5 MG X 11 & 1 MG X 42 tablet; Take 0.5 mg tablet by mouth 1x daily for 3 days, then increase to 0.5 mg tablet 2x daily for 4 days, then increase to 1 mg tablet 2x daily.  Dispense: 53 tablet; Refill: 0 - varenicline (CHANTIX CONTINUING MONTH PAK) 1 MG tablet; Take 1 tablet (1 mg total) by mouth 2 (two) times daily.  Dispense: 60 tablet; Refill: 3  Danelle Berry, PA-C

## 2020-01-05 NOTE — Addendum Note (Signed)
Addended by: Danelle Berry on: 01/05/2020 04:43 PM   Modules accepted: Orders

## 2020-01-06 ENCOUNTER — Ambulatory Visit (INDEPENDENT_AMBULATORY_CARE_PROVIDER_SITE_OTHER): Payer: Medicare (Managed Care)

## 2020-01-06 ENCOUNTER — Telehealth: Payer: Self-pay

## 2020-01-06 DIAGNOSIS — Z0000 Encounter for general adult medical examination without abnormal findings: Secondary | ICD-10-CM | POA: Diagnosis not present

## 2020-01-06 DIAGNOSIS — Z1231 Encounter for screening mammogram for malignant neoplasm of breast: Secondary | ICD-10-CM

## 2020-01-06 NOTE — Patient Instructions (Signed)
Andrea Holden , Thank you for taking time to come for your Medicare Wellness Visit. I appreciate your ongoing commitment to your health goals. Please review the following plan we discussed and let me know if I can assist you in the future.   Screening recommendations/referrals: Colonoscopy: done 12/29/19 Mammogram: done 03/03/19. Please call [facility] in [place] to schedule your next mammogram  Bone Density: done 03/03/19 Recommended yearly ophthalmology/optometry visit for glaucoma screening and checkup Recommended yearly dental visit for hygiene and checkup  Vaccinations: Influenza vaccine: done 03/11/19 Pneumococcal vaccine: done 03/17/19 Tdap vaccine: done 08/15/15 Shingles vaccine: done 12/03/17 & 08/21/18   Covid-19:done 08/13/19 & 09/10/19  Conditions/risks identified: If you wish to quit smoking, help is available. For free tobacco cessation program offerings call the [facility] at ([phone] or Live Well Line at (360) 674-5495. You may also visit [email or web address] or email [email or web address] for more information on other programs.   Next appointment: Follow up in one year for your annual wellness visit    Preventive Care 65 Years and Older, Female Preventive care refers to lifestyle choices and visits with your health care provider that can promote health and wellness. What does preventive care include?  A yearly physical exam. This is also called an annual well check.  Dental exams once or twice a year.  Routine eye exams. Ask your health care provider how often you should have your eyes checked.  Personal lifestyle choices, including:  Daily care of your teeth and gums.  Regular physical activity.  Eating a healthy diet.  Avoiding tobacco and drug use.  Limiting alcohol use.  Practicing safe sex.  Taking low-dose aspirin every day.  Taking vitamin and mineral supplements as recommended by your health care provider. What happens  during an annual well check? The services and screenings done by your health care provider during your annual well check will depend on your age, overall health, lifestyle risk factors, and family history of disease. Counseling  Your health care provider may ask you questions about your:  Alcohol use.  Tobacco use.  Drug use.  Emotional well-being.  Home and relationship well-being.  Sexual activity.  Eating habits.  History of falls.  Memory and ability to understand (cognition).  Work and work Astronomer.  Reproductive health. Screening  You may have the following tests or measurements:  Height, weight, and BMI.  Blood pressure.  Lipid and cholesterol levels. These may be checked every 5 years, or more frequently if you are over 71 years old.  Skin check.  Lung cancer screening. You may have this screening every year starting at age 60 if you have a 30-pack-year history of smoking and currently smoke or have quit within the past 15 years.  Fecal occult blood test (FOBT) of the stool. You may have this test every year starting at age 12.  Flexible sigmoidoscopy or colonoscopy. You may have a sigmoidoscopy every 5 years or a colonoscopy every 10 years starting at age 50.  Hepatitis C blood test.  Hepatitis B blood test.  Sexually transmitted disease (STD) testing.  Diabetes screening. This is done by checking your blood sugar (glucose) after you have not eaten for a while (fasting). You may have this done every 1-3 years.  Bone density scan. This is done to screen for osteoporosis. You may have this done starting at age 47.  Mammogram. This may be done every 1-2 years. Talk to your health care provider about how often you should have  regular mammograms. Talk with your health care provider about your test results, treatment options, and if necessary, the need for more tests. Vaccines  Your health care provider may recommend certain vaccines, such  as:  Influenza vaccine. This is recommended every year.  Tetanus, diphtheria, and acellular pertussis (Tdap, Td) vaccine. You may need a Td booster every 10 years.  Zoster vaccine. You may need this after age 55.  Pneumococcal 13-valent conjugate (PCV13) vaccine. One dose is recommended after age 50.  Pneumococcal polysaccharide (PPSV23) vaccine. One dose is recommended after age 68. Talk to your health care provider about which screenings and vaccines you need and how often you need them. This information is not intended to replace advice given to you by your health care provider. Make sure you discuss any questions you have with your health care provider. Document Released: 07/14/2015 Document Revised: 03/06/2016 Document Reviewed: 04/18/2015 Elsevier Interactive Patient Education  2017 [employer] Prevention in the Home Falls can cause injuries. They can happen to people of all ages. There are many things you can do to make your home safe and to help prevent falls. What can I do on the outside of my home?  Regularly fix the edges of walkways and driveways and fix any cracks.  Remove anything that might make you trip as you walk through a door, such as a raised step or threshold.  Trim any bushes or trees on the path to your home.  Use bright outdoor lighting.  Clear any walking paths of anything that might make someone trip, such as rocks or tools.  Regularly check to see if handrails are loose or broken. Make sure that both sides of any steps have handrails.  Any raised decks and porches should have guardrails on the edges.  Have any leaves, snow, or ice cleared regularly.  Use sand or salt on walking paths during winter.  Clean up any spills in your garage right away. This includes oil or grease spills. What can I do in the bathroom?  Use night lights.  Install grab bars by the toilet and in the tub and shower. Do not use towel bars as grab bars.  Use  non-skid mats or decals in the tub or shower.  If you need to sit down in the shower, use a plastic, non-slip stool.  Keep the floor dry. Clean up any water that spills on the floor as soon as it happens.  Remove soap buildup in the tub or shower regularly.  Attach bath mats securely with double-sided non-slip rug tape.  Do not have throw rugs and other things on the floor that can make you trip. What can I do in the bedroom?  Use night lights.  Make sure that you have a light by your bed that is easy to reach.  Do not use any sheets or blankets that are too big for your bed. They should not hang down onto the floor.  Have a firm chair that has side arms. You can use this for support while you get dressed.  Do not have throw rugs and other things on the floor that can make you trip. What can I do in the kitchen?  Clean up any spills right away.  Avoid walking on wet floors.  Keep items that you use a lot in easy-to-reach places.  If you need to reach something above you, use a strong step stool that has a grab bar.  Keep electrical cords out of the  way.  Do not use floor polish or wax that makes floors slippery. If you must use wax, use non-skid floor wax.  Do not have throw rugs and other things on the floor that can make you trip. What can I do with my stairs?  Do not leave any items on the stairs.  Make sure that there are handrails on both sides of the stairs and use them. Fix handrails that are broken or loose. Make sure that handrails are as long as the stairways.  Check any carpeting to make sure that it is firmly attached to the stairs. Fix any carpet that is loose or worn.  Avoid having throw rugs at the top or bottom of the stairs. If you do have throw rugs, attach them to the floor with carpet tape.  Make sure that you have a light switch at the top of the stairs and the bottom of the stairs. If you do not have them, ask someone to add them for you. What  else can I do to help prevent falls?  Wear shoes that:  Do not have high heels.  Have rubber bottoms.  Are comfortable and fit you well.  Are closed at the toe. Do not wear sandals.  If you use a stepladder:  Make sure that it is fully opened. Do not climb a closed stepladder.  Make sure that both sides of the stepladder are locked into place.  Ask someone to hold it for you, if possible.  Clearly mark and make sure that you can see:  Any grab bars or handrails.  First and last steps.  Where the edge of each step is.  Use tools that help you move around (mobility aids) if they are needed. These include:  Canes.  Walkers.  Scooters.  Crutches.  Turn on the lights when you go into a dark area. Replace any light bulbs as soon as they burn out.  Set up your furniture so you have a clear path. Avoid moving your furniture around.  If any of your floors are uneven, fix them.  If there are any pets around you, be aware of where they are.  Review your medicines with your doctor. Some medicines can make you feel dizzy. This can increase your chance of falling. Ask your doctor what other things that you can do to help prevent falls. This information is not intended to replace advice given to you by your health care provider. Make sure you discuss any questions you have with your health care provider. Document Released: 04/13/2009 Document Revised: 11/23/2015 Document Reviewed: 07/22/2014 Elsevier Interactive Patient Education  2017 [employer].

## 2020-01-06 NOTE — Progress Notes (Signed)
Subjective:   Andrea Holden is a 74 y.o. female who presents for Medicare Annual (Subsequent) preventive examination.  Virtual Visit via Telephone Note  I connected with  Andrea Holden on 01/06/20 at 11:20 AM EDT by telephone and verified that I am speaking with the correct person using two identifiers.  Medicare Annual Wellness visit completed telephonically due to Covid-19 pandemic.   Location: Patient: home Provider: office   I discussed the limitations, risks, security and privacy concerns of performing an evaluation and management service by telephone and the availability of in person appointments. The patient expressed understanding and agreed to proceed.  Unable to perform video visit due to video visit attempted and failed and/or patient does not have video capability.   Some vital signs may be absent or patient reported.   [name], LPN    Review of Systems     Cardiac Risk Factors include: advanced age (>47mn, >>15women);diabetes mellitus;dyslipidemia;hypertension;obesity (BMI >30kg/m2)     Objective:    Today's Vitals   01/06/20 1152  PainSc: 7    There is no height or weight on file to calculate BMI.  Advanced Directives 01/06/2020 12/26/2019 12/26/2019 05/10/2019 01/05/2019 11/17/2018 07/23/2018  Does Patient Have a Medical Advance Directive? No No No Yes Yes No Yes  Type of Advance Directive - - - Healthcare Power of [place] Does patient want to make changes to medical advance directive? - - - - - - No - Patient declined  [place] of [place]? - - - No - copy requested No - copy requested - No - copy requested  Would patient like information on creating a medical advance directive? No - Patient declined No - Patient declined - - - Yes (MAU/Ambulatory/Procedural Areas - Information given) -    Current Medications (verified) Outpatient Encounter Medications as of  01/06/2020  Medication Sig  . acetaminophen (TYLENOL) 325 MG tablet Take 2 tablets (650 mg total) by mouth every 6 (six) hours as needed for mild pain, moderate pain, headache or fever.  .Marland Kitchenalbuterol (VENTOLIN HFA) 108 (90 Base) MCG/ACT inhaler Inhale 2 puffs into the lungs every 6 (six) hours as needed for wheezing or shortness of breath.  .Marland Kitchenatorvastatin (LIPITOR) 10 MG tablet Take 1 tablet (10 mg total) by mouth at bedtime.  . Cholecalciferol (VITAMIN D3) 250 MCG (10000 UT) TABS Take 10,000 Units by mouth daily.   . clopidogrel (PLAVIX) 75 MG tablet Take 1 tablet (75 mg total) by mouth daily.  . fluticasone (FLONASE) 50 MCG/ACT nasal spray Place 2 sprays into both nostrils daily as needed.  . gabapentin (NEURONTIN) 400 MG capsule Take 1 capsule (400 mg total) by mouth 2 (two) times daily.  .Marland Kitchenipratropium-albuterol (DUONEB) 0.5-2.5 (3) MG/3ML SOLN Take 3 mLs by nebulization 3 (three) times daily as needed. (Patient taking differently: Take 3 mLs by nebulization 3 (three) times daily as needed (Shortness of breath). )  . Krill Oil 1000 MG CAPS Take 1,000 mg by mouth every other day.   . lisinopril (ZESTRIL) 10 MG tablet Take 1 tablet (10 mg total) by mouth daily.  . montelukast (SINGULAIR) 10 MG tablet Take 1 tablet (10 mg total) by mouth at bedtime.  . Multiple Vitamins-Minerals (MULTIVITAMIN ADULTS 50+) TABS Take 1 tablet by mouth daily.  . pantoprazole (PROTONIX) 20 MG tablet Take 1 tablet (20 mg total) by mouth daily.  .Marland KitchenRespiratory Therapy Supplies (NEBULIZER/TUBING/MOUTHPIECE) KIT  Disp one nebulizer machine, tubing set and mouthpiece kit  . sertraline (ZOLOFT) 100 MG tablet TAKE 1 AND 1/2 TABLETS (150 MG TOTAL) BY MOUTH DAILY (Patient taking differently: Take 100 mg by mouth daily. )  . terbinafine (LAMISIL) 250 MG tablet Take 1 tablet (250 mg total) by mouth daily.  . vitamin C (ASCORBIC ACID) 500 MG tablet Take 1,000 mg by mouth every other day.   . zinc gluconate 50 MG tablet Take 50 mg by  mouth daily.  Marland Kitchen umeclidinium-vilanterol (ANORO ELLIPTA) 62.5-25 MCG/INH AEPB Inhale 1 puff into the lungs daily. (Patient not taking: Reported on 11/30/2019)  . [START ON 02/03/2020] varenicline (CHANTIX CONTINUING MONTH PAK) 1 MG tablet Take 1 tablet (1 mg total) by mouth 2 (two) times daily.  . varenicline (CHANTIX STARTING MONTH PAK) 0.5 MG X 11 & 1 MG X 42 tablet Take 0.5 mg tablet by mouth 1x daily for 3 days, then increase to 0.5 mg tablet 2x daily for 4 days, then increase to 1 mg tablet 2x daily. (Patient not taking: Reported on 01/06/2020)   No facility-administered encounter medications on file as of 01/06/2020.    Allergies (verified) Meloxicam, Nsaids, and Sitagliptin   History: Past Medical History:  Diagnosis Date  . Allergy    seasonal allergies  . Anemia 2014   needed 5 units of blood d/t passing out, weak  . Anxiety   . Aortic stenosis 12/24/2017   Echo Aug 2018  . Arthritis   . Asthma    allergy induced asthma  . Cardiac murmur 12/12/2017  . Cataract   . Cataract    left  . Complication of anesthesia    arrhythmia following colonoscopy  . Degenerative disc disease, lumbar   . Degenerative disc disease, lumbar   . Depression   . Diabetes mellitus   . Diabetic neuropathy ([place]) 11/16/2015  . Dysrhythmia    stenosis of left ventricle  . GERD (gastroesophageal reflux disease)   . H/O transfusion    patient was given 5 units of blood while at [place], blood type O+  . History of chicken pox   . History of hiatal hernia   . History of measles, mumps, or rubella   . HOH (hard of hearing)    does not use hearing aides yet  . Hyperlipidemia   . Hypertension   . Irregular heartbeat   . LVH (left ventricular hypertrophy) 12/24/2017   Echo Aug 2018  . Neuropathy   . Opiate use 11/16/2015  . Peripheral arterial disease ([place]) 05/06/2018   At rest, left > right; refer to vasc  . Pes planus of both feet 11/16/2015  . Plantar fasciitis of right foot 11/16/2015  . Pneumonia    . Wheezing    Past Surgical History:  Procedure Laterality Date  . ABDOMINAL HYSTERECTOMY    . APPLICATION OF WOUND VAC Left 07/23/2018   Procedure: APPLICATION OF WOUND VAC;  Surgeon: Algernon Huxley, MD;  Location: [facility] ORS;  Service: Vascular;  Laterality: Left;  . banck injections    . CATARACT EXTRACTION W/PHACO Right 05/30/2015   Procedure: CATARACT EXTRACTION PHACO AND INTRAOCULAR LENS PLACEMENT (IOC);  Surgeon: Birder Robson, MD;  Location: [facility] ORS;  Service: Ophthalmology;  Laterality: Right;  [place] 00:57 AP% 20.9 CDE 11.99 fluid pack lot #[id number]  . CATARACT EXTRACTION W/PHACO Left 11/11/2019   Procedure: CATARACT EXTRACTION PHACO AND INTRAOCULAR LENS PLACEMENT ([employer]) LEFT DIABETIC;  Surgeon: Birder Robson, MD;  Location: [facility] OR[facility];  Service: Ophthalmology;  Laterality: Left;  Lot #[id number] H [place]: 00:47.5 CDE: 6.24  . CHOLECYSTECTOMY  1970  . COLON SURGERY  2013   blocked colon  . COLONOSCOPY N/A 12/29/2019   Procedure: COLONOSCOPY;  Surgeon: Virgel Manifold, MD;  Location: [facility] ENDOSCOPY;  Service: Endoscopy;  Laterality: N/A;  . COLONOSCOPY WITH PROPOFOL N/A 01/20/2018   Procedure: COLONOSCOPY WITH PROPOFOL;  Surgeon: Lucilla Lame, MD;  [facility] END[facility];  Service: Endoscopy;  Laterality: N/A;  . ENDARTERECTOMY FEMORAL Left 07/08/2018   Procedure: ENDARTERECTOMY FEMORAL;  Surgeon: Algernon Huxley, MD;  Location: [facility] ORS;  Service: Vascular;  Laterality: Left;  . EYE SURGERY Right    cataract surgery  . FOOT FUSION Right 2018   metal in foot  . GALLBLADDER SURGERY  1970  . GASTRIC BYPASS  2010   lost 178 lbs and regained 40 lbs last few years  . HUMERUS FRACTURE SURGERY Right    metal plate with screws  . internal bleeding  2016   ulcer in past  . LOWER EXTREMITY ANGIOGRAM Left 07/08/2018   Procedure: LOWER EXTREMITY ANGIOGRAM;  Surgeon: Algernon Huxley, MD;  Location: [facility] ORS;  Service: Vascular;  Laterality: Left;  . LOWER EXTREMITY ANGIOGRAPHY Left 05/27/2018    Procedure: LOWER EXTREMITY ANGIOGRAPHY;  Surgeon: Algernon Huxley, MD;  Location: [place] CV LAB;  Service: Cardiovascular;  Laterality: Left;  . LOWER EXTREMITY ANGIOGRAPHY Right 12/07/2018   Procedure: LOWER EXTREMITY ANGIOGRAPHY;  Surgeon: Algernon Huxley, MD;  Location: [place] [facility];  Service: Cardiovascular;  Laterality: Right;  . LOWER EXTREMITY ANGIOGRAPHY Left 05/10/2019   Procedure: LOWER EXTREMITY ANGIOGRAPHY;  Surgeon: Algernon Huxley, MD;  Location: [place] [facility];  Service: Cardiovascular;  Laterality: Left;  . MOUTH SURGERY     root canals and crowns and extractions  . MOUTH SURGERY  09/30/2019  . OVARY SURGERY    . SKIN GRAFT Right 2018   RT foot. foot has been rebuilt.  it is full of metal  . TENOTOMY ACHILLES TENDON Right    Percuntaneous. metal in foot  . TONSILLECTOMY    . VISCERAL ANGIOGRAPHY N/A 12/31/2019   Procedure: VISCERAL ANGIOGRAPHY;  Surgeon: Algernon Huxley, MD;  Location: [place] [facility];  Service: Cardiovascular;  Laterality: N/A;  . WOUND DEBRIDEMENT Left 07/23/2018   Procedure: DEBRIDEMENT WOUND;  Surgeon: Algernon Huxley, MD;  Location: [facility] ORS;  Service: Vascular;  Laterality: Left;   Family History  Problem Relation Age of Onset  . Asthma Mother   . COPD Mother   . Arthritis Father   . Depression Father   . Heart disease Father   . Hypertension Father   . Stroke Father   . Heart attack Father   . Arthritis Brother   . Depression Brother   . Diabetes Brother   . Heart disease Brother   . Hyperlipidemia Brother   . Hypertension Brother   . Stroke Brother   . Vision loss Brother   . Heart attack Brother   . Diabetes Maternal Grandmother   . Thyroid disease Daughter   . Colitis Daughter   . Breast cancer Neg Hx    Social History   Socioeconomic History  . Marital status: Divorced    Spouse name: Not on file  . Number of children: 1  . Years of education: Not on file  . Highest education level: Associate degree: academic program   Occupational History  . Occupation: Oceanographer    Comment: Furniture conservator/restorer  [employer]  Tobacco Use  . Smoking status: Current Some Day Smoker    Packs/day: 0.50    Years: 55.00    Pack years: 27.50    Types: Cigarettes    Start date: 07/01/1960    Last attempt to quit: 11/22/2018    Years since quitting: 1.1  . Smokeless tobacco: Never Used  . Tobacco comment: trying to quit again; wants to use chantix but unable to afford  Vaping Use  . Vaping Use: Never used  Substance and Sexual Activity  . Alcohol use: Yes    Alcohol/week: 0.0 standard drinks    Comment: 2 drinks per month  . Drug use: Not Currently    Types: Marijuana    Comment: used for pain  . Sexual activity: Not Currently  Other Topics Concern  . Not on file  Social History Narrative  . Not on file   Social Determinants of Health   Financial Resource Strain: High Risk  . Difficulty of Paying Living Expenses: Very hard  Food Insecurity: No Food Insecurity  . Worried About Charity fundraiser in the Last Year: Never true  . Ran Out of Food in the Last Year: Never true  Transportation Needs: No Transportation Needs  . Lack of Transportation (Medical): No  . Lack of Transportation (Non-Medical): No  Physical Activity: Inactive  . Days of Exercise per Week: 0 days  . Minutes of Exercise per Session: 0 min  Stress: Stress Concern Present  . Feeling of Stress : To some extent  Social Connections: Unknown  . Frequency of Communication with Friends and Family: Not on file  . Frequency of Social Gatherings with Friends and Family: Not on file  . Attends Religious Services: Not on file  . Active Member of Clubs or Organizations: Not on file  . Attends Archivist Meetings: Not on file  . Marital Status: Divorced    Tobacco Counseling Ready to quit: Not Answered Counseling given: Not Answered Comment: trying to quit again; wants to use chantix but unable to afford   Clinical  Intake:  Pre-visit preparation completed: Yes  Pain : 0-10 Pain Score: 7  Pain Type: Chronic pain Pain Location: Back Pain Orientation: Lower, Upper Pain Descriptors / Indicators: Aching, Discomfort, Sore Pain Onset: More than a month ago Pain Frequency: Intermittent Pain Relieving Factors: heating pad  Pain Relieving Factors: heating pad  Nutritional Risks: None Diabetes: Yes CBG done?: No Did pt. bring in CBG monitor from home?: No  How often do you need to have someone help you when you read instructions, pamphlets, or other written materials from your doctor or pharmacy?: 1 - Never  Nutrition Risk Assessment:  Has the patient had any N/V/D within the last 2 months?  Yes  Does the patient have any non-healing wounds?  No  Has the patient had any unintentional weight loss or weight gain?  No   Diabetes:  Is the patient diabetic?  Yes  If diabetic, was a CBG obtained today?  No  Did the patient bring in their glucometer from home?  No  How often do you monitor your CBG's? Pt does not check blood sugar.   Financial Strains and Diabetes Management:  Are you having any financial strains with the device, your supplies or your medication? Yes .  Does the patient want to be seen by Chronic Care Management for management of their diabetes?  Yes - already enrolled Would the patient like to be referred to a  Nutritionist or for Diabetic Management?  No   Diabetic Exams:  Diabetic Eye Exam: Completed 10/13/19 negative retinopathy.   Diabetic Foot Exam: Completed 11/30/19.     Interpreter Needed?: No  Information entered by :: [name] LPN   Activities of Daily Living In your present state of health, do you have any difficulty performing the following activities: 01/06/2020 12/26/2019  Hearing? N N  Comment declines hearing aids -  Vision? N N  Difficulty concentrating or making decisions? N N  Walking or climbing stairs? Y Y  Dressing or bathing? N N  Doing errands,  shopping? N N  Preparing Food and eating ? N -  Using the Toilet? N -  In the past six months, have you accidently leaked urine? N -  Do you have problems with loss of bowel control? N -  Managing your Medications? N -  Managing your Finances? N -  Housekeeping or managing your Housekeeping? N -  Some recent data might be hidden    Patient Care Team: [name], PA-C as PCP - General (Family Medicine) Dimmig, Marcello Moores, MD as Consulting Physician (Orthopedic Surgery) Nadene Rubins, DO as Consulting Physician (Physical Medicine and Rehabilitation) Rene Paci, MD as Consulting Physician (Plastic Surgery) Minna Merritts, MD as Consulting Physician (Cardiology) Vern Claude, LCSW as [place] (Licensed Clinical Social Worker) Verdia Kuba, [facility] (Pharmacist)  Indicate any recent Medical Services you may have received from other than [facility] providers in the past year (date may be approximate).     Assessment:   This is a routine wellness examination for [place].  Hearing/Vision screen  Hearing Screening   _0  _1  _2  _3  _4  _5  _6  _7  _8   Right ear:           Left ear:           Comments: Pt denies hearing difficulty  Vision Screening Comments: Annual vision screenings [facility] Dr. Michelene Heady  Dietary issues and exercise activities discussed: Current Exercise Habits: The patient does not participate in regular exercise at present, Exercise limited by: orthopedic condition(s)  Goals    .  "I need help finding a new place to live": (pt-stated)      [place] (see longitudinal plan of care for additional care plan information)  Current Barriers:  . Housing barriers  Clinical Social Work Clinical Goal(s):  Marland Kitchen Over the next 90 days, patient will work with the [facility] to address needs related to housing needs  Interventions: . Inter-disciplinary care team collaboration (see  longitudinal plan of care) . Patient continues to discuss concern about need to move . Patient confirmed receiving a call from [facility] who have agreed to assist her with housing . Per patient, she will go to the [employer] tomorrow to complete the application needed for housing . Patient discussed having an appointment with the annual wellness nurse on 01/06/20 and will bring income statement for the pharmacist at that time . Positive reinforcement provided for plans made to follow up with [employer] , annual wellness visit and the [employer] . This Education officer, museum completed a brief housing search through [email or web address] before becoming aware of contact made with [employer].  . Discussed plans with patient for ongoing care management follow up regarding housing and  plan with patient to call back later on today to check on her.  Patient Self Care Activities:  . Performs ADL's independently .  Calls provider office for new concerns or questions . Unable to perform IADLs independently  Please see past updates related to this goal by clicking on the "Past Updates" button in the selected goal      .  DIET - INCREASE WATER INTAKE      Recommend to drink at least 6-8 8oz glasses of water per day.    .  I have cut back on my smoking but have not stopped (pt-stated)      Current Barriers:  . Anxiety related to social isolation secondary to covid-19  Nurse Case Manager Clinical Goal(s):  Marland Kitchen Over the next 30 days, patient will continue reduce her smoking by taking her chantix as prescribed, utilizing 1800 Quit Now  Interventions:  . Assessed for smoking cessation and reason for being unsuccessful . Provided patient with contact information for Consumer Credit Counseling ([facility]) as financial debt is a stressor for her . Discussed reason for choosing to smoke again . Provided emotional support and  reassurance . Encouraged patient to utilize 1800 Quit Now (she has used in the past)  Patient Self Care Activities:  . Self administers medications as prescribed . Attends all scheduled provider appointments . Calls pharmacy for medication refills . Attends church or other social activities . Performs ADL's independently . Performs IADL's independently . Calls provider office for new concerns or questions  Please see past updates related to this goal by clicking on the "Past Updates" button in the selected goal        Depression Screen PHQ 2/9 Scores 01/06/2020 11/30/2019 08/24/2019 05/24/2019 03/11/2019 01/05/2019 12/18/2018  PHQ - 2 Score - _0 0  PHQ- 9 Score - _1 0  Exception Documentation Other- indicate reason in comment box - - - - - -  Not completed patient has discussed depression and anxiety in great detail with CCM social worker Chrystal Land LCSW this week and is one week post discharge from [place]  01/06/2020 11/30/2019 08/24/2019 05/24/2019 03/11/2019  Falls in the past year? 1 1 0 0 1  Comment - - - - -  Number falls in past yr: 1 1 0 0 1  Injury with Fall? 1 1 0 0 0  Comment - - - - -  Risk for fall due to : History of fall(s) History of fall(s);Impaired balance/gait;Impaired mobility - - -  Risk for fall due to: Comment - - - - -  Follow up Falls prevention discussed - - - -    Any stairs in or around the home? Yes  If so, are there any without handrails? Yes  Home free of loose throw rugs in walkways, pet beds, electrical cords, etc? Yes  Adequate lighting in your home to reduce risk of falls? Yes   ASSISTIVE DEVICES UTILIZED TO PREVENT FALLS:  Life alert? Yes  Use of a cane, walker or w/c? Yes  Grab bars in the bathroom? Yes  Shower chair or bench in shower? Yes  Elevated toilet seat or a handicapped toilet? Yes   TIMED UP AND GO:  Was the test performed? No . Telephonic visit.   Cognitive Function: pt declined  6CIT; states no memory issues     6CIT Screen 01/05/2019 10/17/2017  What Year? 0 points 0 points  What month? 0 points 0 points  What time? 0 points 0 points  Count back  from 20 0 points 0 points  Months in reverse 0 points 0 points  Repeat phrase 0 points 0 points  Total Score 0 0    Immunizations Immunization History  Administered Date(s) Administered  . Fluad Quad(high Dose 65+) 03/11/2019  . Influenza, High Dose Seasonal PF 03/16/2015, 02/28/2016, 03/16/2018  . Influenza,inj,Quad PF,6+ Mos 04/09/2017  . Influenza,inj,quad, With Preservative 08/29/2016  . Influenza-Unspecified 04/09/2017, 03/16/2018  . Moderna SARS-COVID-2 Vaccination 08/13/2019, 09/10/2019  . Pneumococcal Conjugate-13 03/16/2015, 11/20/2017, 12/03/2017, 08/21/2018  . Pneumococcal Polysaccharide-23 03/17/2019  . Pneumococcal-Unspecified 08/29/2016  . Tdap 08/15/2015  . Zoster Recombinat (Shingrix) 09/08/2017, 11/20/2017, 12/03/2017, 08/21/2018    TDAP status: Up to date   Flu Vaccine status: Up to date   Pneumococcal vaccine status: Up to date   Covid-19 vaccine status: Completed vaccines  Qualifies for Shingles Vaccine? Yes   Zostavax completed Yes   Shingrix Completed?: Yes  Screening Tests Health Maintenance  Topic Date Due  . FOOT EXAM  12/13/2018  . INFLUENZA VACCINE  01/30/2020  . MAMMOGRAM  03/02/2020  . DEXA SCAN  03/02/2020  . HEMOGLOBIN A1C  05/31/2020  . OPHTHALMOLOGY EXAM  10/12/2020  . COLONOSCOPY  12/28/2024  . TETANUS/TDAP  08/14/2025  . COVID-19 Vaccine  Completed  . Hepatitis C Screening  Completed  . PNA vac Low Risk Adult  Addressed    Health Maintenance  Health Maintenance Due  Topic Date Due  . FOOT EXAM  12/13/2018   Colorectal cancer screening: completed 12/29/19. Repeat    Mammogram status: Completed 03/03/19. Repeat every year   Bone Density status: Completed 03/03/19. Results reflect: Bone density results: OSTEOPENIA. Repeat every 2 years.  Lung Cancer  Screening: (Low Dose CT Chest recommended if Age 19-80 years, 30 pack-year currently smoking OR have quit w/in 15years.) does not qualify.   Additional Screening:  Hepatitis C Screening: does qualify; Completed 10/17/17  Vision Screening: Recommended annual ophthalmology exams for early detection of glaucoma and other disorders of the eye. Is the patient up to date with their annual eye exam?  Yes  Who is the provider or what is the name of the office in which the patient attends annual eye exams? Dr. Michelene Heady   Dental Screening: Recommended annual dental exams for proper oral hygiene  Community Resource Referral / Chronic Care Management: CRR required this visit?  No   CCM required this visit?  No      Plan:     I have personally reviewed and noted the following in the patient's chart:   . Medical and social history . Use of alcohol, tobacco or illicit drugs  . Current medications and supplements . Functional ability and status . Nutritional status . Physical activity . Advanced directives . List of other physicians . Hospitalizations, surgeries, and ER visits in previous 12 months . Vitals . Screenings to include cognitive, depression, and falls . Referrals and appointments  In addition, I have reviewed and discussed with patient certain preventive protocols, quality metrics, and best practice recommendations. A written personalized care plan for preventive services as well as general preventive health recommendations were provided to patient.     [name], LPN   01/31/7289   Nurse Notes: pt d/c from [facility] 12/31/19 and approx 20 mins of wellness visit spent with patient discussing concern for discharge process from hospital and upset with how she was treated which included IV being removed abruptly causing excessive bleeding at the site. She has contacted the patient relations department at the hospital to  file a complaint but has not heard anything yet. Patient has already  spoken to [facility] regarding this concern.   Pt was advised at discharge to schedule hospital follow up appt to check insertion site at her groin from visceral angiography and follow up for labs. Pt states area at insertion site healing well.   Pt is also upset that she started smoking again and had hoped to be able to get her chantix when she was discharged from the hospital but pharmacy had to wait for an order and patient states copay is $93 which she cannot afford. Pt also unable to afford anoro inhaler. Pt to follow up with CCM pharmacist next week.   Patient states she needs a letter from [name] to be able to have her dog registered as an emotional support dog. She has an Licensed conveyancer that she stated she had brought in from [place] and also has a shih tzu and due to her current housing being unavailable as of 03/31/20 she is receiving assistance from care management to seek affordable housing but needs to be able to have her dogs with her. Patient advised to discuss at next appt.   Patient will also need follow up with vascular and gastro.

## 2020-01-07 ENCOUNTER — Telehealth: Payer: Self-pay

## 2020-01-07 ENCOUNTER — Ambulatory Visit: Payer: Self-pay | Admitting: *Deleted

## 2020-01-07 DIAGNOSIS — Z599 Problem related to housing and economic circumstances, unspecified: Secondary | ICD-10-CM

## 2020-01-07 DIAGNOSIS — Z789 Other specified health status: Secondary | ICD-10-CM

## 2020-01-07 DIAGNOSIS — F3341 Major depressive disorder, recurrent, in partial remission: Secondary | ICD-10-CM

## 2020-01-07 NOTE — Patient Instructions (Signed)
Thank you allowing the Chronic Care Management Team to be a part of your care! It was a pleasure speaking with you today!  1. Please follow up with housing resources provided  CCM (Chronic Care Management) Team   [name] RN, BSN Nurse Care Coordinator  [phone]  Gustaf Mccarter [address], LCSW Clinical Social Worker [phone]  Goals Addressed              This Visit's Progress   .  "I need help finding a new place to live": (pt-stated)        CARE PLAN ENTRY (see longitudinal plan of care for additional care plan information)  Current Barriers:  . Housing barriers  Clinical Social Work Clinical Goal(s):  Marland Kitchen Over the next 90 days, patient will work with the [facility] to address needs related to housing needs  Interventions: . Inter-disciplinary care team collaboration (see longitudinal plan of care) . Patient called to report that she has identified a Warden/ranger who will certify her dogs as support animals . Patient confirmed receiving a call from [facility] who now state that they do not assist with housing so there is no need to complete an application . Annual wellness visit completed on 01/06/20 (video visit) so she  will bring income statement for the pharmacist in today . Positive reinforcement provided for initiative taken regarding her pets and efforts made to locate her income statement for the pharmacist  . This social worker completed a brief housing search through [email or web address] and provided patient with contact numbers for additional housing options including information for [facility](patient placed on waiting list) . Discussed plans with patient for ongoing care management follow up regarding housing and  plan with patient to call back later on today to check on her.  Patient Self Care Activities:  . Performs ADL's independently . Calls provider office for new concerns or questions . Unable to perform IADLs  independently  Please see past updates related to this goal by clicking on the "Past Updates" button in the selected goal          The patient verbalized understanding of instructions provided today and declined a print copy of patient instruction materials.   Telephone follow up appointment with care management team member scheduled for:  01/10/20

## 2020-01-07 NOTE — Chronic Care Management (AMB) (Signed)
Chronic Care Management    Clinical Social Work Follow Up Note  01/07/2020 Name: Andrea Holden MRN: [id number] DOB: [birth year]  Andrea Holden is a 74 y.o. year old female who is a primary care patient of [name], [place]. The CCM team was consulted for assistance with Community Resources related to housing .   Review of patient status, including review of consultants reports, other relevant assessments, and collaboration with appropriate care team members and the patient's provider was performed as part of comprehensive patient evaluation and provision of chronic care management services.    SDOH (Social Determinants of Health) assessments performed: No    Outpatient Encounter Medications as of 01/07/2020  Medication Sig Note  . acetaminophen (TYLENOL) 325 MG tablet Take 2 tablets (650 mg total) by mouth every 6 (six) hours as needed for mild pain, moderate pain, headache or fever.   Marland Kitchen albuterol (VENTOLIN HFA) 108 (90 Base) MCG/ACT inhaler Inhale 2 puffs into the lungs every 6 (six) hours as needed for wheezing or shortness of breath.   Marland Kitchen atorvastatin (LIPITOR) 10 MG tablet Take 1 tablet (10 mg total) by mouth at bedtime.   . Cholecalciferol (VITAMIN D3) 250 MCG (10000 UT) TABS Take 10,000 Units by mouth daily.    . clopidogrel (PLAVIX) 75 MG tablet Take 1 tablet (75 mg total) by mouth daily.   . fluticasone (FLONASE) 50 MCG/ACT nasal spray Place 2 sprays into both nostrils daily as needed. 01/06/2020: PRN allergies  . gabapentin (NEURONTIN) 400 MG capsule Take 1 capsule (400 mg total) by mouth 2 (two) times daily.   Marland Kitchen ipratropium-albuterol (DUONEB) 0.5-2.5 (3) MG/3ML SOLN Take 3 mLs by nebulization 3 (three) times daily as needed. (Patient taking differently: Take 3 mLs by nebulization 3 (three) times daily as needed (Shortness of breath). )   . Krill Oil 1000 MG CAPS Take 1,000 mg by mouth every other day.    . lisinopril (ZESTRIL) 10 MG tablet Take 1 tablet (10 mg total) by mouth  daily.   . montelukast (SINGULAIR) 10 MG tablet Take 1 tablet (10 mg total) by mouth at bedtime.   . Multiple Vitamins-Minerals (MULTIVITAMIN ADULTS 50+) TABS Take 1 tablet by mouth daily.   . pantoprazole (PROTONIX) 20 MG tablet Take 1 tablet (20 mg total) by mouth daily.   Marland Kitchen Respiratory Therapy Supplies (NEBULIZER/TUBING/MOUTHPIECE) KIT Disp one nebulizer machine, tubing set and mouthpiece kit   . sertraline (ZOLOFT) 100 MG tablet TAKE 1 AND 1/2 TABLETS (150 MG TOTAL) BY MOUTH DAILY (Patient taking differently: Take 100 mg by mouth daily. )   . terbinafine (LAMISIL) 250 MG tablet Take 1 tablet (250 mg total) by mouth daily.   Marland Kitchen umeclidinium-vilanterol (ANORO ELLIPTA) 62.5-25 MCG/INH AEPB Inhale 1 puff into the lungs daily. (Patient not taking: Reported on 11/30/2019)   . [START ON 02/03/2020] varenicline (CHANTIX CONTINUING MONTH PAK) 1 MG tablet Take 1 tablet (1 mg total) by mouth 2 (two) times daily.   . varenicline (CHANTIX STARTING MONTH PAK) 0.5 MG X 11 & 1 MG X 42 tablet Take 0.5 mg tablet by mouth 1x daily for 3 days, then increase to 0.5 mg tablet 2x daily for 4 days, then increase to 1 mg tablet 2x daily. (Patient not taking: Reported on 01/06/2020) 01/06/2020: Not taking due to cost  . vitamin C (ASCORBIC ACID) 500 MG tablet Take 1,000 mg by mouth every other day.    . zinc gluconate 50 MG tablet Take 50 mg by mouth daily.  No facility-administered encounter medications on file as of 01/07/2020.     Goals Addressed              This Visit's Progress   .  "I need help finding a new place to live": (pt-stated)        [place] (see longitudinal plan of care for additional care plan information)  Current Barriers:  . Housing barriers  Clinical Social Work Clinical Goal(s):  Marland Kitchen Over the next 90 days, patient will work with the [facility] to address needs related to housing needs  Interventions: . Inter-disciplinary care team collaboration (see longitudinal plan of  care) . Patient called to report that she has identified a Engineer, water who will certify her dogs as support animals . Patient confirmed receiving a call from [facility] who now state that they do not assist with housing so there is no need to complete an application . Annual wellness visit completed on 01/06/20 (video visit) so she  will bring income statement for the pharmacist in today . Positive reinforcement provided for initiative taken regarding her pets and efforts made to locate her income statement for the pharmacist  . This social worker completed a brief housing search through [email or web address] and provided patient with contact numbers for additional housing options including information for [facility](patient placed on waiting list) . Discussed plans with patient for ongoing care management follow up regarding housing and  plan with patient to call back later on today to check on her.  Patient Self Care Activities:  . Performs ADL's independently . Calls provider office for new concerns or questions . Unable to perform IADLs independently  Please see past updates related to this goal by clicking on the "Past Updates" button in the selected goal          Follow Up Plan: SW will follow up with patient by phone over the next 7-10 business days   Elliot Gurney, [place] Worker  [facility] Care Management [phone]

## 2020-01-11 ENCOUNTER — Ambulatory Visit: Payer: Self-pay | Admitting: *Deleted

## 2020-01-11 DIAGNOSIS — F3341 Major depressive disorder, recurrent, in partial remission: Secondary | ICD-10-CM

## 2020-01-11 DIAGNOSIS — Z789 Other specified health status: Secondary | ICD-10-CM

## 2020-01-11 DIAGNOSIS — Z599 Problem related to housing and economic circumstances, unspecified: Secondary | ICD-10-CM

## 2020-01-11 NOTE — Patient Instructions (Signed)
Thank you allowing the Chronic Care Management Team to be a part of your care! It was a pleasure speaking with you today!  1. Please be sure to complete the Application for [facility] as soon as possible in order to identify alternative housing  CCM (Chronic Care Management) Team   [name] RN, BSN Nurse Care Coordinator  [phone]  Effrey Davidow [address] LCSW Clinical Social Worker [phone]  Goals Addressed              This Visit's Progress   .  "I need help finding a new place to live": (pt-stated)        CARE PLAN ENTRY (see longitudinal plan of care for additional care plan information)  Current Barriers:  . Housing barriers  Clinical Social Work Clinical Goal(s):  Marland Kitchen Over the next 90 days, patient will work with the [facility] to address needs related to housing needs  Interventions: . Inter-disciplinary care team collaboration (see longitudinal plan of care) . Patient discussed her continued search for housing . Patient confirmed receiving another call from [facility] who now state that they do assist with housing and has provider her with the appropriate application . Collaboration phone call to [employer] who confirmed that patient will need to complete the application, provide income statements, and 2 forms of ID-one approved they will be able to assist with housing . Encouraged patient to complete and submit application as soon as possible  . Discussed plans with patient for ongoing care management follow up regarding housing and  plan with patient to call back later on today to check on her.  Patient Self Care Activities:  . Performs ADL's independently . Calls provider office for new concerns or questions . Unable to perform IADLs independently  Please see past updates related to this goal by clicking on the "Past Updates" button in the selected goal          The patient verbalized  understanding of instructions provided today and declined a print copy of patient instruction materials.   Telephone follow up appointment with care management team member scheduled for: 01/24/20

## 2020-01-11 NOTE — Chronic Care Management (AMB) (Signed)
Chronic Care Management    Clinical Social Work Follow Up Note  01/11/2020 Name: Andrea Holden MRN: [id number] DOB: [birth year]  Andrea Holden is a 74 y.o. year old female who is a primary care patient of [name], [place]. The CCM team was consulted for assistance with [name] .   Review of patient status, including review of consultants reports, other relevant assessments, and collaboration with appropriate care team members and the patient's provider was performed as part of comprehensive patient evaluation and provision of chronic care management services.    SDOH (Social Determinants of Health) assessments performed: No    Outpatient Encounter Medications as of 01/11/2020  Medication Sig Note  . acetaminophen (TYLENOL) 325 MG tablet Take 2 tablets (650 mg total) by mouth every 6 (six) hours as needed for mild pain, moderate pain, headache or fever.   Marland Kitchen albuterol (VENTOLIN HFA) 108 (90 Base) MCG/ACT inhaler Inhale 2 puffs into the lungs every 6 (six) hours as needed for wheezing or shortness of breath.   Marland Kitchen atorvastatin (LIPITOR) 10 MG tablet Take 1 tablet (10 mg total) by mouth at bedtime.   . Cholecalciferol (VITAMIN D3) 250 MCG (10000 UT) TABS Take 10,000 Units by mouth daily.    . clopidogrel (PLAVIX) 75 MG tablet Take 1 tablet (75 mg total) by mouth daily.   . fluticasone (FLONASE) 50 MCG/ACT nasal spray Place 2 sprays into both nostrils daily as needed. 01/06/2020: PRN allergies  . gabapentin (NEURONTIN) 400 MG capsule Take 1 capsule (400 mg total) by mouth 2 (two) times daily.   Marland Kitchen ipratropium-albuterol (DUONEB) 0.5-2.5 (3) MG/3ML SOLN Take 3 mLs by nebulization 3 (three) times daily as needed. (Patient taking differently: Take 3 mLs by nebulization 3 (three) times daily as needed (Shortness of breath). )   . Krill Oil 1000 MG CAPS Take 1,000 mg by mouth every other day.    . lisinopril (ZESTRIL) 10 MG tablet Take 1 tablet (10 mg total) by mouth daily.   .  montelukast (SINGULAIR) 10 MG tablet Take 1 tablet (10 mg total) by mouth at bedtime.   . Multiple Vitamins-Minerals (MULTIVITAMIN ADULTS 50+) TABS Take 1 tablet by mouth daily.   . pantoprazole (PROTONIX) 20 MG tablet Take 1 tablet (20 mg total) by mouth daily.   Marland Kitchen Respiratory Therapy Supplies (NEBULIZER/TUBING/MOUTHPIECE) KIT Disp one nebulizer machine, tubing set and mouthpiece kit   . sertraline (ZOLOFT) 100 MG tablet TAKE 1 AND 1/2 TABLETS (150 MG TOTAL) BY MOUTH DAILY (Patient taking differently: Take 100 mg by mouth daily. )   . terbinafine (LAMISIL) 250 MG tablet Take 1 tablet (250 mg total) by mouth daily.   Marland Kitchen umeclidinium-vilanterol (ANORO ELLIPTA) 62.5-25 MCG/INH AEPB Inhale 1 puff into the lungs daily. (Patient not taking: Reported on 11/30/2019)   . [START ON 02/03/2020] varenicline (CHANTIX CONTINUING MONTH PAK) 1 MG tablet Take 1 tablet (1 mg total) by mouth 2 (two) times daily.   . varenicline (CHANTIX STARTING MONTH PAK) 0.5 MG X 11 & 1 MG X 42 tablet Take 0.5 mg tablet by mouth 1x daily for 3 days, then increase to 0.5 mg tablet 2x daily for 4 days, then increase to 1 mg tablet 2x daily. (Patient not taking: Reported on 01/06/2020) 01/06/2020: Not taking due to cost  . vitamin C (ASCORBIC ACID) 500 MG tablet Take 1,000 mg by mouth every other day.    . zinc gluconate 50 MG tablet Take 50 mg by mouth daily.    No facility-administered  encounter medications on file as of 01/11/2020.     Goals Addressed              This Visit's Progress   .  "I need help finding a new place to live": (pt-stated)        [place] (see longitudinal plan of care for additional care plan information)  Current Barriers:  . Housing barriers  Clinical Social Work Clinical Goal(s):  Marland Kitchen Over the next 90 days, patient will work with the [facility] to address needs related to housing needs  Interventions: . Inter-disciplinary care team collaboration (see longitudinal plan of care) . Patient  discussed her continued search for housing . Patient confirmed receiving another call from [facility] who now state that they do assist with housing and has provider her with the appropriate application . Collaboration phone call to [employer] who confirmed that patient will need to complete the application, provide income statements, and 2 forms of ID-one approved they will be able to assist with housing . Encouraged patient to complete and submit application as soon as possible  . Discussed plans with patient for ongoing care management follow up regarding housing and  plan with patient to call back later on today to check on her.  Patient Self Care Activities:  . Performs ADL's independently . Calls provider office for new concerns or questions . Unable to perform IADLs independently  Please see past updates related to this goal by clicking on the "Past Updates" button in the selected goal          Follow Up Plan: SW will follow up with patient by phone over the next 14 business days   [place], [place] Worker  [facility] Care Management [phone]

## 2020-01-12 ENCOUNTER — Other Ambulatory Visit: Payer: Self-pay

## 2020-01-12 ENCOUNTER — Ambulatory Visit: Payer: Medicare (Managed Care) | Admitting: Pharmacist

## 2020-01-12 DIAGNOSIS — Z87891 Personal history of nicotine dependence: Secondary | ICD-10-CM

## 2020-01-12 DIAGNOSIS — E782 Mixed hyperlipidemia: Secondary | ICD-10-CM

## 2020-01-12 DIAGNOSIS — Z789 Other specified health status: Secondary | ICD-10-CM | POA: Insufficient documentation

## 2020-01-12 NOTE — Patient Instructions (Addendum)
Visit Information  Goals Addressed            This Visit's Progress   . Chronic Care Management       CARE PLAN ENTRY (see longitudinal plan of care for additional care plan information)  Current Barriers:  . Chronic Disease Management support, education, and care coordination needs related to Hypertension, Hyperlipidemia, and Tobacco use   Hypertension BP Readings from Last 3 Encounters:  12/31/19 (!) 146/64  11/30/19 132/68  11/11/19 (!) 128/53   . Pharmacist Clinical Goal(s): o Over the next 90 days, patient will work with PharmD and providers to achieve BP goal <140/90 . Current regimen:  o Lisinopril 10mg  daily . Interventions: o None . Patient self care activities - Over the next 90 days, patient will: o Check BP daily, document, and provide at future appointments o Ensure daily salt intake < 2300 mg/day o Resolve financial stressors currently driving hypertension  Hyperlipidemia Lab Results  Component Value Date/Time   LDLCALC 68 11/30/2019 12:40 PM   . Pharmacist Clinical Goal(s): o Over the next 90 days, patient will work with PharmD and providers to maintain LDL goal < 70 . Current regimen:  o Lipitor 10mg  daily . Interventions: o None . Patient self care activities - Over the next 90 days, patient will: o Continue current approach to lipid management  Tobacco Abuse . Pharmacist Clinical Goal(s) o Over the next 90 days, patient will work with PharmD and providers to stop smoking . Current regimen:  o About 20 cigarettes daily . Interventions: o Start Chantix once patient assistance application process is complete . Patient self care activities - Over the next 90 days, patient will: o Participate in [facility] smoking cessation classes  Medication management . Pharmacist Clinical Goal(s): o Over the next 90 days, patient will work with PharmD and providers to achieve optimal medication adherence . Current pharmacy:  [facility] . Interventions o Comprehensive medication review performed. o Continue current medication management strategy . Patient self care activities - Over the next 90 days, patient will: o Focus on medication adherence by providing patient assistance documentation as requested o Take medications as prescribed o Report any questions or concerns to PharmD and/or provider(s)  Initial goal documentation        Ms. Haxton was given information about Chronic Care Management services today including:  1. CCM service includes personalized support from designated clinical staff supervised by her physician, including individualized plan of care and coordination with other care providers 2. 24/7 contact phone numbers for assistance for urgent and routine care needs. 3. Standard insurance, coinsurance, copays and deductibles apply for chronic care management only during months in which we provide at least 20 minutes of these services. Most insurances cover these services at 100%, however patients may be responsible for any copay, coinsurance and/or deductible if applicable. This service may help you avoid the need for more expensive face-to-face services. 4. Only one practitioner may furnish and bill the service in a calendar month. 5. The patient may stop CCM services at any time (effective at the end of the month) by phone call to the office staff.  Patient agreed to services and verbal consent obtained.   Print copy of patient instructions provided.  Telephone follow up appointment with pharmacy team member scheduled for: 3 months  , PharmD, [place], CTTS Clinical Pharmacist [facility] [phone]  Coping with Quitting Smoking  Quitting smoking is a physical and mental challenge. You will face cravings, withdrawal symptoms, and  temptation. Before quitting, work with your health care provider to make a plan that can help you cope. Preparation can help you quit  and keep you from giving in. How can I cope with cravings? Cravings usually last for 5-10 minutes. If you get through it, the craving will pass. Consider taking the following actions to help you cope with cravings:  Keep your mouth busy: ? Chew sugar-free gum. ? Suck on hard candies or a straw. ? Brush your teeth.  Keep your hands and body busy: ? Immediately change to a different activity when you feel a craving. ? Squeeze or play with a ball. ? Do an activity or a hobby, like making bead jewelry, practicing needlepoint, or working with wood. ? Mix up your normal routine. ? Take a short exercise break. Go for a quick walk or run up and down stairs. ? Spend time in public places where smoking is not allowed.  Focus on doing something kind or helpful for someone else.  Call a friend or family member to talk during a craving.  Join a support group.  Call a quit line, such as 1-800-QUIT-NOW.  Talk with your health care provider about medicines that might help you cope with cravings and make quitting easier for you. How can I deal with withdrawal symptoms? Your body may experience negative effects as it tries to get used to not having nicotine in the system. These effects are called withdrawal symptoms. They may include:  Feeling hungrier than normal.  Trouble concentrating.  Irritability.  Trouble sleeping.  Feeling depressed.  Restlessness and agitation.  Craving a cigarette. To manage withdrawal symptoms:  Avoid places, people, and activities that trigger your cravings.  Remember why you want to quit.  Get plenty of sleep.  Avoid coffee and other caffeinated drinks. These may worsen some of your symptoms. How can I handle social situations? Social situations can be difficult when you are quitting smoking, especially in the first few weeks. To manage this, you can:  Avoid parties, bars, and other social situations where people might be smoking.  Avoid  alcohol.  Leave right away if you have the urge to smoke.  Explain to your family and friends that you are quitting smoking. Ask for understanding and support.  Plan activities with friends or family where smoking is not an option. What are some ways I can cope with stress? Wanting to smoke may cause stress, and stress can make you want to smoke. Find ways to manage your stress. Relaxation techniques can help. For example:  Breathe slowly and deeply, in through your nose and out through your mouth.  Listen to soothing, relaxing music.  Talk with a family member or friend about your stress.  Light a candle.  Soak in a bath or take a shower.  Think about a peaceful place. What are some ways I can prevent weight gain? Be aware that many people gain weight after they quit smoking. However, not everyone does. To keep from gaining weight, have a plan in place before you quit and stick to the plan after you quit. Your plan should include:  Having healthy snacks. When you have a craving, it may help to: ? Eat plain popcorn, crunchy carrots, celery, or other cut vegetables. ? Chew sugar-free gum.  Changing how you eat: ? Eat small portion sizes at meals. ? Eat 4-6 small meals throughout the day instead of 1-2 large meals a day. ? Be mindful when you eat. Do not watch  television or do other things that might distract you as you eat.  Exercising regularly: ? Make time to exercise each day. If you do not have time for a long workout, do short bouts of exercise for 5-10 minutes several times a day. ? Do some form of strengthening exercise, like weight lifting, and some form of aerobic exercise, like running or swimming.  Drinking plenty of water or other low-calorie or no-calorie drinks. Drink 6-8 glasses of water daily, or as much as instructed by your health care provider. Summary  Quitting smoking is a physical and mental challenge. You will face cravings, withdrawal symptoms, and  temptation to smoke again. Preparation can help you as you go through these challenges.  You can cope with cravings by keeping your mouth busy (such as by chewing gum), keeping your body and hands busy, and making calls to family, friends, or a helpline for people who want to quit smoking.  You can cope with withdrawal symptoms by avoiding places where people smoke, avoiding drinks with caffeine, and getting plenty of rest.  Ask your health care provider about the different ways to prevent weight gain, avoid stress, and handle social situations. This information is not intended to replace advice given to you by your health care provider. Make sure you discuss any questions you have with your health care provider. Document Revised: 05/30/2017 Document Reviewed: 06/14/2016 Elsevier Patient Education  2020 [employer].

## 2020-01-12 NOTE — Chronic Care Management (AMB) (Signed)
Chronic Care Management Pharmacy  Name: Andrea Holden  MRN: [id number] DOB: Sep 27, 1945  Chief Complaint/ HPI  Andrea Holden,  74 y.o. , female presents for their Initial CCM visit with the clinical pharmacist via telephone due to COVID-19 Pandemic.  PCP : [name], PA-C  Their chronic conditions include: CAD, HTL, HLD, Tobacco  Office Visits: NA  Consult Visit: 6/30 colonoscopy  Medications: Outpatient Encounter Medications as of 01/12/2020  Medication Sig Note  . albuterol (VENTOLIN HFA) 108 (90 Base) MCG/ACT inhaler Inhale 2 puffs into the lungs every 6 (six) hours as needed for wheezing or shortness of breath.   Marland Kitchen atorvastatin (LIPITOR) 10 MG tablet Take 1 tablet (10 mg total) by mouth at bedtime.   . Cholecalciferol (VITAMIN D3) 250 MCG (10000 UT) TABS Take 10,000 Units by mouth daily.    . clopidogrel (PLAVIX) 75 MG tablet Take 1 tablet (75 mg total) by mouth daily.   . fluticasone (FLONASE) 50 MCG/ACT nasal spray Place 2 sprays into both nostrils daily as needed. 01/06/2020: PRN allergies  . gabapentin (NEURONTIN) 400 MG capsule Take 1 capsule (400 mg total) by mouth 2 (two) times daily.   Marland Kitchen ipratropium-albuterol (DUONEB) 0.5-2.5 (3) MG/3ML SOLN Take 3 mLs by nebulization 3 (three) times daily as needed. (Patient taking differently: Take 3 mLs by nebulization 3 (three) times daily as needed (Shortness of breath). )   . lisinopril (ZESTRIL) 10 MG tablet Take 1 tablet (10 mg total) by mouth daily.   . montelukast (SINGULAIR) 10 MG tablet Take 1 tablet (10 mg total) by mouth at bedtime.   . Multiple Vitamins-Minerals (MULTIVITAMIN ADULTS 50+) TABS Take 1 tablet by mouth daily.   . pantoprazole (PROTONIX) 20 MG tablet Take 1 tablet (20 mg total) by mouth daily.   Marland Kitchen Respiratory Therapy Supplies (NEBULIZER/TUBING/MOUTHPIECE) KIT Disp one nebulizer machine, tubing set and mouthpiece kit   . sertraline (ZOLOFT) 100 MG tablet TAKE 1 AND 1/2 TABLETS (150 MG TOTAL) BY MOUTH  DAILY (Patient taking differently: Take 100 mg by mouth daily. )   . terbinafine (LAMISIL) 250 MG tablet Take 1 tablet (250 mg total) by mouth daily.   Marland Kitchen umeclidinium-vilanterol (ANORO ELLIPTA) 62.5-25 MCG/INH AEPB Inhale 1 puff into the lungs daily.   Marland Kitchen zinc gluconate 50 MG tablet Take 50 mg by mouth daily.   Marland Kitchen acetaminophen (TYLENOL) 325 MG tablet Take 2 tablets (650 mg total) by mouth every 6 (six) hours as needed for mild pain, moderate pain, headache or fever.   Javier Docker Oil 1000 MG CAPS Take 1,000 mg by mouth every other day.  (Patient not taking: Reported on 01/12/2020)   . [START ON 02/03/2020] varenicline (CHANTIX CONTINUING MONTH PAK) 1 MG tablet Take 1 tablet (1 mg total) by mouth 2 (two) times daily. (Patient not taking: Reported on 01/12/2020)   . varenicline (CHANTIX STARTING MONTH PAK) 0.5 MG X 11 & 1 MG X 42 tablet Take 0.5 mg tablet by mouth 1x daily for 3 days, then increase to 0.5 mg tablet 2x daily for 4 days, then increase to 1 mg tablet 2x daily. (Patient not taking: Reported on 01/06/2020) 01/06/2020: Not taking due to cost  . vitamin C (ASCORBIC ACID) 500 MG tablet Take 1,000 mg by mouth every other day.  (Patient not taking: Reported on 01/12/2020)    No facility-administered encounter medications on file as of 01/12/2020.      Financial Resource Strain: High Risk  . Difficulty of Paying Living Expenses: Very hard  Current Diagnosis/Assessment:  Goals Addressed            This Visit's Progress   . Chronic Care Management       CARE PLAN ENTRY (see longitudinal plan of care for additional care plan information)  Current Barriers:  . Chronic Disease Management support, education, and care coordination needs related to Hypertension, Hyperlipidemia, and Tobacco use   Hypertension BP Readings from Last 3 Encounters:  12/31/19 (!) 146/64  11/30/19 132/68  11/11/19 (!) 128/53   . Pharmacist Clinical Goal(s): o Over the next 90 days, patient will work with PharmD and  providers to achieve BP goal <140/90 . Current regimen:  o Lisinopril 28m daily . Interventions: o None . Patient self care activities - Over the next 90 days, patient will: o Check BP daily, document, and provide at future appointments o Ensure daily salt intake < 2300 mg/day o Resolve financial stressors currently driving hypertension  Hyperlipidemia Lab Results  Component Value Date/Time   LDLCALC 68 11/30/2019 12:40 PM   . Pharmacist Clinical Goal(s): o Over the next 90 days, patient will work with PharmD and providers to maintain LDL goal < 70 . Current regimen:  o Lipitor 144mdaily . Interventions: o None . Patient self care activities - Over the next 90 days, patient will: o Continue current approach to lipid management  Tobacco Abuse . Pharmacist Clinical Goal(s) o Over the next 90 days, patient will work with PharmD and providers to stop smoking . Current regimen:  o About 20 cigarettes daily . Interventions: o Start Chantix once patient assistance application process is complete . Patient self care activities - Over the next 90 days, patient will: o [facility]  Medication management . Pharmacist Clinical Goal(s): o Over the next 90 days, patient will work with PharmD and providers to achieve optimal medication adherence . [facility] Interventions o Comprehensive medication review performed. o Continue current medication management strategy . Patient self care activities - Over the next 90 days, patient will: o Focus on medication adherence by providing patient assistance documentation as requested o Take medications as prescribed o Report any questions or concerns to PharmD and/or provider(s)  Initial goal documentation       Hypertension   BP goal is:  <140/90  Office blood pressures are  BP Readings from Last 3 Encounters:  12/31/19 (!) 146/64  11/30/19 132/68  11/11/19 (!) 128/53    Patient checks BP at home infrequently Patient home BP readings are ranging: Better than office readings  Patient has failed these meds in the past: NA Patient is currently uncontrolled on the following medications:  . Lisinopril 1025maily  We discussed: BP worse due to combination of white coat syndrome and financial stressors. Has hired a debProgrammer, applicationsontinue current medications      Hyperlipidemia   LDL goal < 100  Lipid Panel     Component Value Date/Time   CHOL 134 11/30/2019 1240   CHOL 182 11/21/2015 1416   TRIG 112 11/30/2019 1240   HDL 46 (L) 11/30/2019 1240   HDL 44 11/21/2015 1416   LDLCALC 68 11/30/2019 1240    Hepatic Function Latest Ref Rng & Units 12/26/2019 11/30/2019 06/03/2019  Total Protein 6.5 - 8.1 g/dL 6.2(L) 6.8 6.6  Albumin 3.5 - 5.0 g/dL 3.6 - -  AST 15 - 41 U/L '15 15 15  ' ALT 0 - 44 U/L '11 10 11  ' Alk Phosphatase  38 - 126 U/L 70 - -  Total Bilirubin 0.3 - 1.2 mg/dL 0.7 0.4 0.5     The 10-year ASCVD risk score Mikey Bussing DC Jr., et al., 2013) is: 46.5%   Values used to calculate the score:     Age: 49 years     Sex: Female     Is Non-Hispanic African American: No     Diabetic: Yes     Tobacco smoker: Yes     Systolic Blood Pressure: 628 mmHg     Is BP treated: Yes     HDL Cholesterol: 46 mg/dL     Total Cholesterol: 134 mg/dL   Patient has failed these meds in past: NA Patient is currently controlled on the following medications:  . Lipitor 30m daily  We discussed:   Denies myalgias At goal, which will improve further with smoking cessation   Plan  Continue current medications  Tobacco Abuse   Tobacco Status:  Social History   Tobacco Use  Smoking Status Current Some Day Smoker  . Packs/day: 0.50  . Years: 55.00  . Pack years: 27.50  . Types: Cigarettes  . Start date: 07/01/1960  . Last attempt to quit: 11/22/2018  . Years since quitting: 1.1  Smokeless Tobacco Never Used  Tobacco Comment   trying to  quit again; wants to use chantix but unable to afford    Patient triggers include: stress On a scale of 1-10, reports MOTIVATION to quit is 8 On a scale of 1-10, reports CONFIDENCE in quitting is 7  Patient has failed these meds in past: Chantix Patient is currently uncontrolled on the following medications:  . Nicotine patch during hospital stay only  We discussed:   About 20 cigarettes daily [facility] daily Anoro ellipta daily DuoNeb about 2 - 3 times weekly Flonase only in spring Counseled on high use of Ventolin and impact of smoking a pack daily Chantix sent to [facility] but unaffordable at $93. Patient states proof of income dropped off last week.  Plan  Order Chantix through [facility] Continue current medications   Medication Management   Pt uses [facility] medications Uses pill box? Yes Pt endorses 100% compliance Zero copay, [place] delivers  We discussed:  Dropped off financials for Chantix Gabapentin is effective Juvian and blood flow 9 MVI balance and nature Lamisil working on toenails  Plan  Order Chantix and, later, Anoro Ellipta from PAP Continue current medication management strategy  Follow up: 3 month phone visit  TMilus Height PharmD, [phone]

## 2020-01-13 ENCOUNTER — Ambulatory Visit: Payer: Self-pay | Admitting: *Deleted

## 2020-01-13 DIAGNOSIS — Z789 Other specified health status: Secondary | ICD-10-CM

## 2020-01-13 DIAGNOSIS — F3341 Major depressive disorder, recurrent, in partial remission: Secondary | ICD-10-CM

## 2020-01-13 DIAGNOSIS — Z599 Problem related to housing and economic circumstances, unspecified: Secondary | ICD-10-CM

## 2020-01-13 NOTE — Chronic Care Management (AMB) (Signed)
   Chronic Care Management   Unsuccessful Call Note 01/13/2020 Name: Andrea Holden MRN: [id number] DOB: 1946-03-08  Patient is a 74 year old female who sees Danelle Berry, [place] for primary care. Danelle Berry, PA-C asked the CCM team to consult the patient for [facility].      Patient left 3 voicemail messages for this social worker to call her back. This Child psychotherapist returned patient's call, however was unable to reach patient. I have left HIPAA compliant voicemail asking patient to return my call. (unsuccessful outreach #[phone]).   Plan: Will follow-up within 7 business days via telephone.     Verna Czech, LCSW [facility] Care Management [phone]

## 2020-01-14 NOTE — Chronic Care Management (AMB) (Signed)
Chronic Care Management    Clinical Social Work Follow Up Note  01/14/2020 Name: Andrea Holden MRN: [id number] DOB: 1945/07/26  Andrea Holden is a 74 y.o. year old female who is a primary care patient of [name], [place]. The CCM team was consulted for assistance with [name] .   Review of patient status, including review of consultants reports, other relevant assessments, and collaboration with appropriate care team members and the patient's provider was performed as part of comprehensive patient evaluation and provision of chronic care management services.    SDOH (Social Determinants of Health) assessments performed: No    Outpatient Encounter Medications as of 01/13/2020  Medication Sig Note  . acetaminophen (TYLENOL) 325 MG tablet Take 2 tablets (650 mg total) by mouth every 6 (six) hours as needed for mild pain, moderate pain, headache or fever.   Marland Kitchen albuterol (VENTOLIN HFA) 108 (90 Base) MCG/ACT inhaler Inhale 2 puffs into the lungs every 6 (six) hours as needed for wheezing or shortness of breath.   Marland Kitchen atorvastatin (LIPITOR) 10 MG tablet Take 1 tablet (10 mg total) by mouth at bedtime.   . Cholecalciferol (VITAMIN D3) 250 MCG (10000 UT) TABS Take 10,000 Units by mouth daily.    . clopidogrel (PLAVIX) 75 MG tablet Take 1 tablet (75 mg total) by mouth daily.   . fluticasone (FLONASE) 50 MCG/ACT nasal spray Place 2 sprays into both nostrils daily as needed. 01/06/2020: PRN allergies  . gabapentin (NEURONTIN) 400 MG capsule Take 1 capsule (400 mg total) by mouth 2 (two) times daily.   Marland Kitchen ipratropium-albuterol (DUONEB) 0.5-2.5 (3) MG/3ML SOLN Take 3 mLs by nebulization 3 (three) times daily as needed. (Patient taking differently: Take 3 mLs by nebulization 3 (three) times daily as needed (Shortness of breath). )   . Krill Oil 1000 MG CAPS Take 1,000 mg by mouth every other day.  (Patient not taking: Reported on 01/12/2020)   . lisinopril (ZESTRIL) 10 MG tablet Take 1  tablet (10 mg total) by mouth daily.   . montelukast (SINGULAIR) 10 MG tablet Take 1 tablet (10 mg total) by mouth at bedtime.   . Multiple Vitamins-Minerals (MULTIVITAMIN ADULTS 50+) TABS Take 1 tablet by mouth daily.   . pantoprazole (PROTONIX) 20 MG tablet Take 1 tablet (20 mg total) by mouth daily.   Marland Kitchen Respiratory Therapy Supplies (NEBULIZER/TUBING/MOUTHPIECE) KIT Disp one nebulizer machine, tubing set and mouthpiece kit   . sertraline (ZOLOFT) 100 MG tablet TAKE 1 AND 1/2 TABLETS (150 MG TOTAL) BY MOUTH DAILY (Patient taking differently: Take 100 mg by mouth daily. )   . terbinafine (LAMISIL) 250 MG tablet Take 1 tablet (250 mg total) by mouth daily.   Marland Kitchen umeclidinium-vilanterol (ANORO ELLIPTA) 62.5-25 MCG/INH AEPB Inhale 1 puff into the lungs daily.   Derrill Memo ON 02/03/2020] varenicline (CHANTIX CONTINUING MONTH PAK) 1 MG tablet Take 1 tablet (1 mg total) by mouth 2 (two) times daily. (Patient not taking: Reported on 01/12/2020)   . varenicline (CHANTIX STARTING MONTH PAK) 0.5 MG X 11 & 1 MG X 42 tablet Take 0.5 mg tablet by mouth 1x daily for 3 days, then increase to 0.5 mg tablet 2x daily for 4 days, then increase to 1 mg tablet 2x daily. (Patient not taking: Reported on 01/06/2020) 01/06/2020: Not taking due to cost  . vitamin C (ASCORBIC ACID) 500 MG tablet Take 1,000 mg by mouth every other day.  (Patient not taking: Reported on 01/12/2020)   . zinc gluconate 50 MG  tablet Take 50 mg by mouth daily.    No facility-administered encounter medications on file as of 01/13/2020.     Goals Addressed              This Visit's Progress   .  "I need help finding a new place to live": (pt-stated)        [name] (see longitudinal plan of care for additional care plan information)  Current Barriers:  . Housing barriers  Clinical Social Work Clinical Goal(s):  Marland Kitchen Over the next 90 days, patient will work with the [facility] to address needs related to housing  needs  Interventions: . Inter-disciplinary care team collaboration (see longitudinal plan of care) . Return call from patient requesting assistance with sending an e-mail to the care one program to request assistance with her utility bills . This Education officer, museum suggested reaching out to a neighbor or friend to assist due to difficulty instructing by phone . Patient discussed not yet hearing from patient experience-additional phone number given  [phone] . Patient confirmed that she continues to work on application received from  [place] to  assist with housing  . Encouraged patient to complete and submit application as soon as possible  . Discussed plans with patient for ongoing care management follow up regarding housing and  plan with patient to call back later on today to check on her.  Patient Self Care Activities:  . Performs ADL's independently . Calls provider office for new concerns or questions . Unable to perform IADLs independently  Please see past updates related to this goal by clicking on the "Past Updates" button in the selected goal          Follow Up Plan: SW will follow up with patient by phone over the next 7-14 business days   [place], [place] Worker  [facility] Care Management [phone]

## 2020-01-14 NOTE — Patient Instructions (Addendum)
Thank you allowing the Chronic Care Management Team to be a part of your care! It was a pleasure speaking with you today!  1. Please complete the housing application with [employer] as soon as possible for assistance with your housing needs.  CCM (Chronic Care Management) Team   [name] RN, BSN Nurse Care Coordinator  [phone]  [place], LCSW Clinical Social Worker [phone]  Goals Addressed              This Visit's Progress     "I need help finding a new place to live": (pt-stated)        CARE PLAN ENTRY (see longitudinal plan of care for additional care plan information)  Current Barriers:   Housing barriers  Clinical Social Work Clinical Goal(s):   Over the next 90 days, patient will work with the [facility] to address needs related to housing needs  Interventions:  Inter-disciplinary care team collaboration (see longitudinal plan of care)  Return call from patient requesting assistance with sending an e-mail to the care one program to request assistance with her utility bills  This social worker suggested reaching out to a neighbor or friend to assist due to difficulty instructing by phone  Patient discussed not yet hearing from patient experience-additional phone number given  [phone]  Patient confirmed that she continues to work on application received from  [employer] to  assist with housing   Encouraged patient to complete and submit application as soon as possible   Discussed plans with patient for ongoing care management follow up regarding housing and  plan with patient to call back later on today to check on her.  Patient Self Care Activities:   Performs ADL's independently  Calls provider office for new concerns or questions  Unable to perform IADLs independently  Please see past updates related to this goal by clicking on the "Past Updates" button in the selected goal           The patient verbalized understanding of instructions provided today and declined a print copy of patient instruction materials.   Telephone follow up appointment with care management team member scheduled for: 01/24/20

## 2020-01-18 ENCOUNTER — Telehealth: Payer: Self-pay | Admitting: Pharmacist

## 2020-01-18 ENCOUNTER — Ambulatory Visit: Payer: Self-pay | Admitting: *Deleted

## 2020-01-18 NOTE — Chronic Care Management (AMB) (Signed)
Chronic Care Management   Social Work Note  01/18/2020 Name: Andrea Holden MRN: [id number] DOB: 04-11-1946  Andrea Holden is a 74 y.o. year old female who sees [name], [place] for primary care. The CCM team was consulted for assistance with [name] .   Voicemail received from patient stating that she has found an apartment and now needs assistance with the rent. This Education officer, museum returned the call but received a voicemail. Message left for a return call.  SDOH (Social Determinants of Health) assessments performed: No     Outpatient Encounter Medications as of 01/18/2020  Medication Sig Note  . acetaminophen (TYLENOL) 325 MG tablet Take 2 tablets (650 mg total) by mouth every 6 (six) hours as needed for mild pain, moderate pain, headache or fever.   Marland Kitchen albuterol (VENTOLIN HFA) 108 (90 Base) MCG/ACT inhaler Inhale 2 puffs into the lungs every 6 (six) hours as needed for wheezing or shortness of breath.   Marland Kitchen atorvastatin (LIPITOR) 10 MG tablet Take 1 tablet (10 mg total) by mouth at bedtime.   . Cholecalciferol (VITAMIN D3) 250 MCG (10000 UT) TABS Take 10,000 Units by mouth daily.    . clopidogrel (PLAVIX) 75 MG tablet Take 1 tablet (75 mg total) by mouth daily.   . fluticasone (FLONASE) 50 MCG/ACT nasal spray Place 2 sprays into both nostrils daily as needed. 01/06/2020: PRN allergies  . gabapentin (NEURONTIN) 400 MG capsule Take 1 capsule (400 mg total) by mouth 2 (two) times daily.   Marland Kitchen ipratropium-albuterol (DUONEB) 0.5-2.5 (3) MG/3ML SOLN Take 3 mLs by nebulization 3 (three) times daily as needed. (Patient taking differently: Take 3 mLs by nebulization 3 (three) times daily as needed (Shortness of breath). )   . Krill Oil 1000 MG CAPS Take 1,000 mg by mouth every other day.  (Patient not taking: Reported on 01/12/2020)   . lisinopril (ZESTRIL) 10 MG tablet Take 1 tablet (10 mg total) by mouth daily.   . montelukast (SINGULAIR) 10 MG tablet Take 1 tablet (10 mg total) by  mouth at bedtime.   . Multiple Vitamins-Minerals (MULTIVITAMIN ADULTS 50+) TABS Take 1 tablet by mouth daily.   . pantoprazole (PROTONIX) 20 MG tablet Take 1 tablet (20 mg total) by mouth daily.   Marland Kitchen Respiratory Therapy Supplies (NEBULIZER/TUBING/MOUTHPIECE) KIT Disp one nebulizer machine, tubing set and mouthpiece kit   . sertraline (ZOLOFT) 100 MG tablet TAKE 1 AND 1/2 TABLETS (150 MG TOTAL) BY MOUTH DAILY (Patient taking differently: Take 100 mg by mouth daily. )   . terbinafine (LAMISIL) 250 MG tablet Take 1 tablet (250 mg total) by mouth daily.   Marland Kitchen umeclidinium-vilanterol (ANORO ELLIPTA) 62.5-25 MCG/INH AEPB Inhale 1 puff into the lungs daily.   Derrill Memo ON 02/03/2020] varenicline (CHANTIX CONTINUING MONTH PAK) 1 MG tablet Take 1 tablet (1 mg total) by mouth 2 (two) times daily. (Patient not taking: Reported on 01/12/2020)   . varenicline (CHANTIX STARTING MONTH PAK) 0.5 MG X 11 & 1 MG X 42 tablet Take 0.5 mg tablet by mouth 1x daily for 3 days, then increase to 0.5 mg tablet 2x daily for 4 days, then increase to 1 mg tablet 2x daily. (Patient not taking: Reported on 01/06/2020) 01/06/2020: Not taking due to cost  . vitamin C (ASCORBIC ACID) 500 MG tablet Take 1,000 mg by mouth every other day.  (Patient not taking: Reported on 01/12/2020)   . zinc gluconate 50 MG tablet Take 50 mg by mouth daily.    No  facility-administered encounter medications on file as of 01/18/2020.    Goals Addressed   None     Follow Up Plan: SW will follow up with patient by phone over the next 7-14 business days  [place], [place] Worker  [facility] Care Management [phone]

## 2020-01-19 ENCOUNTER — Ambulatory Visit: Payer: Self-pay | Admitting: *Deleted

## 2020-01-19 DIAGNOSIS — Z789 Other specified health status: Secondary | ICD-10-CM

## 2020-01-19 DIAGNOSIS — Z599 Problem related to housing and economic circumstances, unspecified: Secondary | ICD-10-CM

## 2020-01-19 DIAGNOSIS — F3341 Major depressive disorder, recurrent, in partial remission: Secondary | ICD-10-CM

## 2020-01-19 NOTE — Patient Instructions (Signed)
Thank you allowing the Chronic Care Management Team to be a part of your care! It was a pleasure speaking with you today!  1. Please be sure to complete the application for housing as soon as possible  CCM (Chronic Care Management) Team   [name] RN, BSN Nurse Care Coordinator  [phone]  Phillip Maffei [address], LCSW Clinical Social Worker [phone]  Goals Addressed              This Visit's Progress   .  "I need help finding a new place to live": (pt-stated)        CARE PLAN ENTRY (see longitudinal plan of care for additional care plan information)  Current Barriers:  . Housing barriers  Clinical Social Work Clinical Goal(s):  Marland Kitchen Over the next 90 days, patient will work with the [facility] to address needs related to housing needs  Interventions: . Inter-disciplinary care team collaboration (see longitudinal plan of care) . Return call from patient stating that the home that she wanted to now has a waiting list . Patient discussed feeling disappointed and deflated as she feels she has no foreseeable options  . Patient confirmed that her neighbors have been helping her downsize and pack for the move . Patient confirmed that she has not completed the application received from  [employer] to  assist with housing  . Encouraged patient to complete and submit application as soon as possible  . Discussed plans with patient for ongoing care management follow up regarding housing and  plan with patient to call back later on today to check on her.  Patient Self Care Activities:  . Performs ADL's independently . Calls provider office for new concerns or questions . Unable to perform IADLs independently  Please see past updates related to this goal by clicking on the "Past Updates" button in the selected goal          The patient verbalized understanding of instructions provided today and declined a print copy of patient instruction materials.    Telephone follow up appointment with care management team member scheduled for: 01/20/20

## 2020-01-19 NOTE — Chronic Care Management (AMB) (Signed)
Chronic Care Management    Clinical Social Work Follow Up Note  01/19/2020 Name: Andrea Holden MRN: [id number] DOB: 1946-03-24  Andrea Holden is a 74 y.o. year old female who is a primary care patient of [name], [place]. The CCM team was consulted for assistance with [name] .   Review of patient status, including review of consultants reports, other relevant assessments, and collaboration with appropriate care team members and the patient's provider was performed as part of comprehensive patient evaluation and provision of chronic care management services.    SDOH (Social Determinants of Health) assessments performed: No    Outpatient Encounter Medications as of 01/19/2020  Medication Sig Note  . acetaminophen (TYLENOL) 325 MG tablet Take 2 tablets (650 mg total) by mouth every 6 (six) hours as needed for mild pain, moderate pain, headache or fever.   Marland Kitchen albuterol (VENTOLIN HFA) 108 (90 Base) MCG/ACT inhaler Inhale 2 puffs into the lungs every 6 (six) hours as needed for wheezing or shortness of breath.   Marland Kitchen atorvastatin (LIPITOR) 10 MG tablet Take 1 tablet (10 mg total) by mouth at bedtime.   . Cholecalciferol (VITAMIN D3) 250 MCG (10000 UT) TABS Take 10,000 Units by mouth daily.    . clopidogrel (PLAVIX) 75 MG tablet Take 1 tablet (75 mg total) by mouth daily.   . fluticasone (FLONASE) 50 MCG/ACT nasal spray Place 2 sprays into both nostrils daily as needed. 01/06/2020: PRN allergies  . gabapentin (NEURONTIN) 400 MG capsule Take 1 capsule (400 mg total) by mouth 2 (two) times daily.   Marland Kitchen ipratropium-albuterol (DUONEB) 0.5-2.5 (3) MG/3ML SOLN Take 3 mLs by nebulization 3 (three) times daily as needed. (Patient taking differently: Take 3 mLs by nebulization 3 (three) times daily as needed (Shortness of breath). )   . Krill Oil 1000 MG CAPS Take 1,000 mg by mouth every other day.  (Patient not taking: Reported on 01/12/2020)   . lisinopril (ZESTRIL) 10 MG tablet Take 1  tablet (10 mg total) by mouth daily.   . montelukast (SINGULAIR) 10 MG tablet Take 1 tablet (10 mg total) by mouth at bedtime.   . Multiple Vitamins-Minerals (MULTIVITAMIN ADULTS 50+) TABS Take 1 tablet by mouth daily.   . pantoprazole (PROTONIX) 20 MG tablet Take 1 tablet (20 mg total) by mouth daily.   Marland Kitchen Respiratory Therapy Supplies (NEBULIZER/TUBING/MOUTHPIECE) KIT Disp one nebulizer machine, tubing set and mouthpiece kit   . sertraline (ZOLOFT) 100 MG tablet TAKE 1 AND 1/2 TABLETS (150 MG TOTAL) BY MOUTH DAILY (Patient taking differently: Take 100 mg by mouth daily. )   . terbinafine (LAMISIL) 250 MG tablet Take 1 tablet (250 mg total) by mouth daily.   Marland Kitchen umeclidinium-vilanterol (ANORO ELLIPTA) 62.5-25 MCG/INH AEPB Inhale 1 puff into the lungs daily.   Derrill Memo ON 02/03/2020] varenicline (CHANTIX CONTINUING MONTH PAK) 1 MG tablet Take 1 tablet (1 mg total) by mouth 2 (two) times daily. (Patient not taking: Reported on 01/12/2020)   . varenicline (CHANTIX STARTING MONTH PAK) 0.5 MG X 11 & 1 MG X 42 tablet Take 0.5 mg tablet by mouth 1x daily for 3 days, then increase to 0.5 mg tablet 2x daily for 4 days, then increase to 1 mg tablet 2x daily. (Patient not taking: Reported on 01/06/2020) 01/06/2020: Not taking due to cost  . vitamin C (ASCORBIC ACID) 500 MG tablet Take 1,000 mg by mouth every other day.  (Patient not taking: Reported on 01/12/2020)   . zinc gluconate 50 MG  tablet Take 50 mg by mouth daily.    No facility-administered encounter medications on file as of 01/19/2020.     Goals Addressed              This Visit's Progress   .  "I need help finding a new place to live": (pt-stated)        [place] (see longitudinal plan of care for additional care plan information)  Current Barriers:  . Housing barriers  Clinical Social Work Clinical Goal(s):  Marland Kitchen Over the next 90 days, patient will work with the [facility] to address needs related to housing  needs  Interventions: . Inter-disciplinary care team collaboration (see longitudinal plan of care) . Return call from patient stating that the home that she wanted to now has a waiting list . Patient discussed feeling disappointed and deflated as she feels she has no foreseeable options  . Patient confirmed that her neighbors have been helping her downsize and pack for the move . Patient confirmed that she has not completed the application received from  [employer] to  assist with housing  . Encouraged patient to complete and submit application as soon as possible  . Discussed plans with patient for ongoing care management follow up regarding housing and  plan with patient to call back later on today to check on her.  Patient Self Care Activities:  . Performs ADL's independently . Calls provider office for new concerns or questions . Unable to perform IADLs independently  Please see past updates related to this goal by clicking on the "Past Updates" button in the selected goal          Follow Up Plan: SW will follow up with patient by phone over the next 7-10 business days   Elliot Gurney, [place] Worker  [facility] Care Management [phone]

## 2020-01-20 ENCOUNTER — Ambulatory Visit: Payer: Self-pay | Admitting: *Deleted

## 2020-01-20 DIAGNOSIS — F3341 Major depressive disorder, recurrent, in partial remission: Secondary | ICD-10-CM

## 2020-01-20 DIAGNOSIS — Z599 Problem related to housing and economic circumstances, unspecified: Secondary | ICD-10-CM

## 2020-01-20 DIAGNOSIS — Z789 Other specified health status: Secondary | ICD-10-CM

## 2020-01-20 NOTE — Chronic Care Management (AMB) (Signed)
Chronic Care Management    Clinical Social Work Follow Up Note  01/20/2020 Name: Andrea Holden MRN: [id number] DOB: 1946-04-29  Andrea Holden is a 74 y.o. year old female who is a primary care patient of [name], [place]. The CCM team was consulted for assistance with [name] .   Review of patient status, including review of consultants reports, other relevant assessments, and collaboration with appropriate care team members and the patient's provider was performed as part of comprehensive patient evaluation and provision of chronic care management services.    SDOH (Social Determinants of Health) assessments performed: No    Outpatient Encounter Medications as of 01/20/2020  Medication Sig Note  . acetaminophen (TYLENOL) 325 MG tablet Take 2 tablets (650 mg total) by mouth every 6 (six) hours as needed for mild pain, moderate pain, headache or fever.   Marland Kitchen albuterol (VENTOLIN HFA) 108 (90 Base) MCG/ACT inhaler Inhale 2 puffs into the lungs every 6 (six) hours as needed for wheezing or shortness of breath.   Marland Kitchen atorvastatin (LIPITOR) 10 MG tablet Take 1 tablet (10 mg total) by mouth at bedtime.   . Cholecalciferol (VITAMIN D3) 250 MCG (10000 UT) TABS Take 10,000 Units by mouth daily.    . clopidogrel (PLAVIX) 75 MG tablet Take 1 tablet (75 mg total) by mouth daily.   . fluticasone (FLONASE) 50 MCG/ACT nasal spray Place 2 sprays into both nostrils daily as needed. 01/06/2020: PRN allergies  . gabapentin (NEURONTIN) 400 MG capsule Take 1 capsule (400 mg total) by mouth 2 (two) times daily.   Marland Kitchen ipratropium-albuterol (DUONEB) 0.5-2.5 (3) MG/3ML SOLN Take 3 mLs by nebulization 3 (three) times daily as needed. (Patient taking differently: Take 3 mLs by nebulization 3 (three) times daily as needed (Shortness of breath). )   . Krill Oil 1000 MG CAPS Take 1,000 mg by mouth every other day.  (Patient not taking: Reported on 01/12/2020)   . lisinopril (ZESTRIL) 10 MG tablet Take 1  tablet (10 mg total) by mouth daily.   . montelukast (SINGULAIR) 10 MG tablet Take 1 tablet (10 mg total) by mouth at bedtime.   . Multiple Vitamins-Minerals (MULTIVITAMIN ADULTS 50+) TABS Take 1 tablet by mouth daily.   . pantoprazole (PROTONIX) 20 MG tablet Take 1 tablet (20 mg total) by mouth daily.   Marland Kitchen Respiratory Therapy Supplies (NEBULIZER/TUBING/MOUTHPIECE) KIT Disp one nebulizer machine, tubing set and mouthpiece kit   . sertraline (ZOLOFT) 100 MG tablet TAKE 1 AND 1/2 TABLETS (150 MG TOTAL) BY MOUTH DAILY (Patient taking differently: Take 100 mg by mouth daily. )   . terbinafine (LAMISIL) 250 MG tablet Take 1 tablet (250 mg total) by mouth daily.   Marland Kitchen umeclidinium-vilanterol (ANORO ELLIPTA) 62.5-25 MCG/INH AEPB Inhale 1 puff into the lungs daily.   Derrill Memo ON 02/03/2020] varenicline (CHANTIX CONTINUING MONTH PAK) 1 MG tablet Take 1 tablet (1 mg total) by mouth 2 (two) times daily. (Patient not taking: Reported on 01/12/2020)   . varenicline (CHANTIX STARTING MONTH PAK) 0.5 MG X 11 & 1 MG X 42 tablet Take 0.5 mg tablet by mouth 1x daily for 3 days, then increase to 0.5 mg tablet 2x daily for 4 days, then increase to 1 mg tablet 2x daily. (Patient not taking: Reported on 01/06/2020) 01/06/2020: Not taking due to cost  . vitamin C (ASCORBIC ACID) 500 MG tablet Take 1,000 mg by mouth every other day.  (Patient not taking: Reported on 01/12/2020)   . zinc gluconate 50 MG  tablet Take 50 mg by mouth daily.    No facility-administered encounter medications on file as of 01/20/2020.     Goals Addressed              This Visit's Progress   .  "I need help finding a new place to live": (pt-stated)        [place] (see longitudinal plan of care for additional care plan information)  Current Barriers:  . Housing barriers  Clinical Social Work Clinical Goal(s):  Marland Kitchen Over the next 90 days, patient will work with the [facility] to address needs related to housing  needs  Interventions: . Inter-disciplinary care team collaboration (see longitudinal plan of care) . Collaboration phone call to landlord Caffie Damme to inquire about additional properties that may be available for rent-no availabilities for another few weeks, however he does not accept pets . Patient discussed completing over half of the application for [employer] for housing assistance   . Patient plans to present to the office on Monday for further assistance with the application . Encouraged patient to continue to work on the application for submission as soon as possible  . Discussed plans with patient for ongoing care management follow up regarding housing and  plan with patient to call back later on today to check on her.  Patient Self Care Activities:  . Performs ADL's independently . Calls provider office for new concerns or questions . Unable to perform IADLs independently  Please see past updates related to this goal by clicking on the "Past Updates" button in the selected goal          Follow Up Plan: Appointment scheduled for SW to meet with client in provider office on:  01/24/20   Elliot Gurney, [place] Worker  [facility] Care Management [phone]

## 2020-01-20 NOTE — Patient Instructions (Signed)
Thank you allowing the Chronic Care Management Team to be a part of your care! It was a pleasure speaking with you today!  1. Please continue to work on the application for housing assistance through [employer]  CCM (Chronic Care Management) Team   [name] RN, BSN Nurse Care Coordinator  [phone]  [place], LCSW Clinical Social Worker [phone]  Goals Addressed              This Visit's Progress   .  "I need help finding a new place to live": (pt-stated)        CARE PLAN ENTRY (see longitudinal plan of care for additional care plan information)  Current Barriers:  . Housing barriers  Clinical Social Work Clinical Goal(s):  Marland Kitchen Over the next 90 days, patient will work with the [facility] to address needs related to housing needs  Interventions: . Inter-disciplinary care team collaboration (see longitudinal plan of care) . Collaboration phone call to landlord Eduardo Osier to inquire about additional properties that may be available for rent-no availabilities for another few weeks, however he does not accept pets . Patient discussed completing over half of the application for [employer] for housing assistance   . Patient plans to present to the office on Monday for further assistance with the application . Encouraged patient to continue to work on the application for submission as soon as possible  . Discussed plans with patient for ongoing care management follow up regarding housing and  plan with patient to call back later on today to check on her.  Patient Self Care Activities:  . Performs ADL's independently . Calls provider office for new concerns or questions . Unable to perform IADLs independently  Please see past updates related to this goal by clicking on the "Past Updates" button in the selected goal          The patient verbalized understanding of instructions provided today and declined a print  copy of patient instruction materials.   Face to Face appointment with care management team member scheduled for: 01/24/20

## 2020-01-24 ENCOUNTER — Telehealth: Payer: Self-pay

## 2020-01-24 ENCOUNTER — Ambulatory Visit: Payer: Medicare (Managed Care) | Admitting: *Deleted

## 2020-01-24 ENCOUNTER — Ambulatory Visit: Payer: Self-pay

## 2020-01-24 DIAGNOSIS — F3341 Major depressive disorder, recurrent, in partial remission: Secondary | ICD-10-CM

## 2020-01-24 DIAGNOSIS — Z789 Other specified health status: Secondary | ICD-10-CM

## 2020-01-24 DIAGNOSIS — E782 Mixed hyperlipidemia: Secondary | ICD-10-CM | POA: Diagnosis not present

## 2020-01-24 DIAGNOSIS — Z599 Problem related to housing and economic circumstances, unspecified: Secondary | ICD-10-CM

## 2020-01-24 NOTE — Chronic Care Management (AMB) (Signed)
Chronic Care Management    Clinical Social Work Follow Up Note  01/24/2020 Name: THIA OLESEN MRN: [id number] DOB: 02/04/1946  Breuna J Dart is a 74 y.o. year old female who is a primary care patient of [name], [place]. The CCM team was consulted for assistance with [name] .   Review of patient status, including review of consultants reports, other relevant assessments, and collaboration with appropriate care team members and the patient's provider was performed as part of comprehensive patient evaluation and provision of chronic care management services.    SDOH (Social Determinants of Health) assessments performed: No    Outpatient Encounter Medications as of 01/24/2020  Medication Sig Note  . acetaminophen (TYLENOL) 325 MG tablet Take 2 tablets (650 mg total) by mouth every 6 (six) hours as needed for mild pain, moderate pain, headache or fever.   Marland Kitchen albuterol (VENTOLIN HFA) 108 (90 Base) MCG/ACT inhaler Inhale 2 puffs into the lungs every 6 (six) hours as needed for wheezing or shortness of breath.   Marland Kitchen atorvastatin (LIPITOR) 10 MG tablet Take 1 tablet (10 mg total) by mouth at bedtime.   . Cholecalciferol (VITAMIN D3) 250 MCG (10000 UT) TABS Take 10,000 Units by mouth daily.    . clopidogrel (PLAVIX) 75 MG tablet Take 1 tablet (75 mg total) by mouth daily.   . fluticasone (FLONASE) 50 MCG/ACT nasal spray Place 2 sprays into both nostrils daily as needed. 01/06/2020: PRN allergies  . gabapentin (NEURONTIN) 400 MG capsule Take 1 capsule (400 mg total) by mouth 2 (two) times daily.   Marland Kitchen ipratropium-albuterol (DUONEB) 0.5-2.5 (3) MG/3ML SOLN Take 3 mLs by nebulization 3 (three) times daily as needed. (Patient taking differently: Take 3 mLs by nebulization 3 (three) times daily as needed (Shortness of breath). )   . Krill Oil 1000 MG CAPS Take 1,000 mg by mouth every other day.  (Patient not taking: Reported on 01/12/2020)   . lisinopril (ZESTRIL) 10 MG tablet Take 1  tablet (10 mg total) by mouth daily.   . montelukast (SINGULAIR) 10 MG tablet Take 1 tablet (10 mg total) by mouth at bedtime.   . Multiple Vitamins-Minerals (MULTIVITAMIN ADULTS 50+) TABS Take 1 tablet by mouth daily.   . pantoprazole (PROTONIX) 20 MG tablet Take 1 tablet (20 mg total) by mouth daily.   Marland Kitchen Respiratory Therapy Supplies (NEBULIZER/TUBING/MOUTHPIECE) KIT Disp one nebulizer machine, tubing set and mouthpiece kit   . sertraline (ZOLOFT) 100 MG tablet TAKE 1 AND 1/2 TABLETS (150 MG TOTAL) BY MOUTH DAILY (Patient taking differently: Take 100 mg by mouth daily. )   . terbinafine (LAMISIL) 250 MG tablet Take 1 tablet (250 mg total) by mouth daily.   Marland Kitchen umeclidinium-vilanterol (ANORO ELLIPTA) 62.5-25 MCG/INH AEPB Inhale 1 puff into the lungs daily.   Derrill Memo ON 02/03/2020] varenicline (CHANTIX CONTINUING MONTH PAK) 1 MG tablet Take 1 tablet (1 mg total) by mouth 2 (two) times daily. (Patient not taking: Reported on 01/12/2020)   . varenicline (CHANTIX STARTING MONTH PAK) 0.5 MG X 11 & 1 MG X 42 tablet Take 0.5 mg tablet by mouth 1x daily for 3 days, then increase to 0.5 mg tablet 2x daily for 4 days, then increase to 1 mg tablet 2x daily. (Patient not taking: Reported on 01/06/2020) 01/06/2020: Not taking due to cost  . vitamin C (ASCORBIC ACID) 500 MG tablet Take 1,000 mg by mouth every other day.  (Patient not taking: Reported on 01/12/2020)   . zinc gluconate 50 MG  tablet Take 50 mg by mouth daily.    No facility-administered encounter medications on file as of 01/24/2020.     Goals Addressed              This Visit's Progress   .  "I need help finding a new place to live": (pt-stated)        [place] (see longitudinal plan of care for additional care plan information)  Current Barriers:  . Housing barriers  Clinical Social Work Clinical Goal(s):  Marland Kitchen Over the next 90 days, patient will work with the Ask [facility] to address needs related to housing  needs  Interventions: . Inter-disciplinary care team collaboration (see longitudinal plan of care) . Face to Face visit with patient to assist with the housing application for [employer] for housing assistance   . Patient plans to present to the office today to submit the application . Encouraged patient to submit application and closely follow up for in housing prospects . Collaboration phone call to [facility] to discuss housing possibilities in their Independent Living-voicemail was full could not leave a message . Discussed plans with patient for ongoing care management follow up regarding housing and  plan with patient to call back later on today to check on her.  Patient Self Care Activities:  . Performs ADL's independently . Calls provider office for new concerns or questions . Unable to perform IADLs independently  Please see past updates related to this goal by clicking on the "Past Updates" button in the selected goal          Follow Up Plan: Appointment scheduled for SW follow up with client by phone on: 02/07/20   Elliot Gurney, [place] Worker  [facility] Care Management [phone]

## 2020-01-24 NOTE — Patient Instructions (Signed)
Thank you allowing the Chronic Care Management Team to be a part of your care! It was a pleasure speaking with you today!  1. Please submit the housing application to the [employer] today to begin receiving assistance with Housing  CCM (Chronic Care Management) Team   [name] RN, BSN Nurse Care Coordinator  [phone]  [place], LCSW Clinical Social Worker [phone]  Goals Addressed              This Visit's Progress   .  "I need help finding a new place to live": (pt-stated)        CARE PLAN ENTRY (see longitudinal plan of care for additional care plan information)  Current Barriers:  . Housing barriers  Clinical Social Work Clinical Goal(s):  Marland Kitchen Over the next 90 days, patient will work with the Ask [facility] to address needs related to housing needs  Interventions: . Inter-disciplinary care team collaboration (see longitudinal plan of care) . Face to Face visit with patient to assist with the housing application for [employer] for housing assistance   . Patient plans to present to the office today to submit the application . Encouraged patient to submit application and closely follow up for in housing prospects . Collaboration phone call to [place] [facility] to discuss housing possibilities in their Independent Living-voicemail was full could not leave a message . Discussed plans with patient for ongoing care management follow up regarding housing and  plan with patient to call back later on today to check on her.  Patient Self Care Activities:  . Performs ADL's independently . Calls provider office for new concerns or questions . Unable to perform IADLs independently  Please see past updates related to this goal by clicking on the "Past Updates" button in the selected goal          The patient verbalized understanding of instructions provided today and declined a print copy of patient instruction materials.    Telephone follow up appointment with care management team member scheduled for: 01/31/20

## 2020-01-26 ENCOUNTER — Ambulatory Visit: Payer: Self-pay | Admitting: *Deleted

## 2020-01-26 DIAGNOSIS — F3341 Major depressive disorder, recurrent, in partial remission: Secondary | ICD-10-CM

## 2020-01-26 DIAGNOSIS — Z789 Other specified health status: Secondary | ICD-10-CM

## 2020-01-26 DIAGNOSIS — Z599 Problem related to housing and economic circumstances, unspecified: Secondary | ICD-10-CM

## 2020-01-26 NOTE — Patient Instructions (Addendum)
Thank you allowing the Chronic Care Management Team to be a part of your care! It was a pleasure speaking with you today!  1. Please continue to follow up on housing resources identified  CCM (Chronic Care Management) Team   [name] RN, BSN Nurse Care Coordinator  [phone]  Camrin Gearheart [address] LCSW Clinical Social Worker [phone]  Goals Addressed              This Visit's Progress   .  "I need help finding a new place to live": (pt-stated)        CARE PLAN ENTRY (see longitudinal plan of care for additional care plan information)  Current Barriers:  . Housing barriers  Clinical Social Work Clinical Goal(s):  Marland Kitchen Over the next 90 days, patient will work with the Ask [facility] to address needs related to housing needs  Interventions: . Inter-disciplinary care team collaboration (see longitudinal plan of care) . Return phone call to patient who stated that she returned the housing application to the [employer] but was not accepted because she resides in [facility] [place] and her income was too high . Patient discussed now thinking of getting a roommate and will be meeting her today to look at a rental  . Encouraged patient to choose roommates wisely and will continue to follow up on housing resources as they become available . Discussed plans with patient for ongoing care management follow up regarding housing and  plan with patient to call back later on today to check on her.  Patient Self Care Activities:  . Performs ADL's independently . Calls provider office for new concerns or questions . Unable to perform IADLs independently  Please see past updates related to this goal by clicking on the "Past Updates" button in the selected goal          The patient verbalized understanding of instructions provided today and declined a print copy of patient instruction materials.   Telephone follow up appointment with care management team member  scheduled for: 01/31/20

## 2020-01-26 NOTE — Chronic Care Management (AMB) (Signed)
Chronic Care Management    Clinical Social Work Follow Up Note  01/26/2020 Name: Andrea Holden MRN: [id number] DOB: 06-Aug-1945  Andrea Holden is a 74 y.o. year old female who is a primary care patient of Andrea Holden, [place]. The CCM team was consulted for assistance with [name] .   Review of patient status, including review of consultants reports, other relevant assessments, and collaboration with appropriate care team members and the patient's provider was performed as part of comprehensive patient evaluation and provision of chronic care management services.    SDOH (Social Determinants of Health) assessments performed: No    Outpatient Encounter Medications as of 01/26/2020  Medication Sig Note  . acetaminophen (TYLENOL) 325 MG tablet Take 2 tablets (650 mg total) by mouth every 6 (six) hours as needed for mild pain, moderate pain, headache or fever.   Marland Kitchen albuterol (VENTOLIN HFA) 108 (90 Base) MCG/ACT inhaler Inhale 2 puffs into the lungs every 6 (six) hours as needed for wheezing or shortness of breath.   Marland Kitchen atorvastatin (LIPITOR) 10 MG tablet Take 1 tablet (10 mg total) by mouth at bedtime.   . Cholecalciferol (VITAMIN D3) 250 MCG (10000 UT) TABS Take 10,000 Units by mouth daily.    . clopidogrel (PLAVIX) 75 MG tablet Take 1 tablet (75 mg total) by mouth daily.   . fluticasone (FLONASE) 50 MCG/ACT nasal spray Place 2 sprays into both nostrils daily as needed. 01/06/2020: PRN allergies  . gabapentin (NEURONTIN) 400 MG capsule Take 1 capsule (400 mg total) by mouth 2 (two) times daily.   Marland Kitchen ipratropium-albuterol (DUONEB) 0.5-2.5 (3) MG/3ML SOLN Take 3 mLs by nebulization 3 (three) times daily as needed. (Patient taking differently: Take 3 mLs by nebulization 3 (three) times daily as needed (Shortness of breath). )   . Krill Oil 1000 MG CAPS Take 1,000 mg by mouth every other day.  (Patient not taking: Reported on 01/12/2020)   . lisinopril (ZESTRIL) 10 MG tablet Take 1  tablet (10 mg total) by mouth daily.   . montelukast (SINGULAIR) 10 MG tablet Take 1 tablet (10 mg total) by mouth at bedtime.   . Multiple Vitamins-Minerals (MULTIVITAMIN ADULTS 50+) TABS Take 1 tablet by mouth daily.   . pantoprazole (PROTONIX) 20 MG tablet Take 1 tablet (20 mg total) by mouth daily.   Marland Kitchen Respiratory Therapy Supplies (NEBULIZER/TUBING/MOUTHPIECE) KIT Disp one nebulizer machine, tubing set and mouthpiece kit   . sertraline (ZOLOFT) 100 MG tablet TAKE 1 AND 1/2 TABLETS (150 MG TOTAL) BY MOUTH DAILY (Patient taking differently: Take 100 mg by mouth daily. )   . terbinafine (LAMISIL) 250 MG tablet Take 1 tablet (250 mg total) by mouth daily.   Marland Kitchen umeclidinium-vilanterol (ANORO ELLIPTA) 62.5-25 MCG/INH AEPB Inhale 1 puff into the lungs daily.   Derrill Memo ON 02/03/2020] varenicline (CHANTIX CONTINUING MONTH PAK) 1 MG tablet Take 1 tablet (1 mg total) by mouth 2 (two) times daily. (Patient not taking: Reported on 01/12/2020)   . varenicline (CHANTIX STARTING MONTH PAK) 0.5 MG X 11 & 1 MG X 42 tablet Take 0.5 mg tablet by mouth 1x daily for 3 days, then increase to 0.5 mg tablet 2x daily for 4 days, then increase to 1 mg tablet 2x daily. (Patient not taking: Reported on 01/06/2020) 01/06/2020: Not taking due to cost  . vitamin C (ASCORBIC ACID) 500 MG tablet Take 1,000 mg by mouth every other day.  (Patient not taking: Reported on 01/12/2020)   . zinc gluconate 50 MG  tablet Take 50 mg by mouth daily.    No facility-administered encounter medications on file as of 01/26/2020.     Goals Addressed              This Visit's Progress   .  "I need help finding a new place to live": (pt-stated)        [place] (see longitudinal plan of care for additional care plan information)  Current Barriers:  . Housing barriers  Clinical Social Work Clinical Goal(s):  Marland Kitchen Over the next 90 days, patient will work with the [facility] to address needs related to housing  needs  Interventions: . Inter-disciplinary care team collaboration (see longitudinal plan of care) . Return phone call to patient who stated that she returned the housing application to the [employer] but was not accepted because she resides in [facility] and her income was too high . Patient discussed now thinking of getting a roommate and will be meeting her today to look at a rental  . Encouraged patient to choose roommates wisely and will continue to follow up on housing resources as they become available . Discussed plans with patient for ongoing care management follow up regarding housing and  plan with patient to call back later on today to check on her.  Patient Self Care Activities:  . Performs ADL's independently . Calls provider office for new concerns or questions . Unable to perform IADLs independently  Please see past updates related to this goal by clicking on the "Past Updates" button in the selected goal          Follow Up Plan: SW will follow up with patient by phone over the next 7-14 business days   [place], [place] Worker  [facility] Care Management [phone]

## 2020-01-28 ENCOUNTER — Telehealth: Payer: Self-pay | Admitting: *Deleted

## 2020-01-28 NOTE — Telephone Encounter (Signed)
   Chronic Care Management   Unsuccessful Call Note 01/28/2020 Name: TISHANNA DUNFORD MRN: [id number] DOB: 09-01-1945  Patient is a 74 year old female who sees Danelle Berry, PA-C for primary care. Danelle Berry, PA-C asked the CCM team to consult the patient for community resources related to housing.    This social Was unable to reach patient via telephone today for follow up call. I have left HIPAA compliant voicemail asking patient to return my call. (unsuccessful outreach #1).   Plan: Will follow-up within 7 business days via telephone.     Verna Czech, LCSW [facility] [phone]

## 2020-01-31 ENCOUNTER — Telehealth: Payer: Self-pay

## 2020-01-31 ENCOUNTER — Ambulatory Visit: Payer: Self-pay | Admitting: *Deleted

## 2020-01-31 NOTE — Chronic Care Management (AMB) (Signed)
° °  Chronic Care Management   Unsuccessful Call Note 01/31/2020 Name: Andrea Holden MRN: [id number] DOB: August 02, 1945  Patient  is a 74 year old female who sees Danelle Berry, PA-C for primary care. Andrea Holden   asked the CCM team to consult the patient for community resources.     This social worker was unable to reach patient via telephone today for follow up call regarding housing. I have left HIPAA compliant voicemail asking patient to return my call. (unsuccessful outreach #2).   Plan: Will follow-up within 7 business days via telephone.     Andrea Czech, LCSW [facility] [phone]

## 2020-02-08 ENCOUNTER — Telehealth: Payer: Self-pay

## 2020-02-08 ENCOUNTER — Telehealth: Payer: Self-pay | Admitting: *Deleted

## 2020-02-08 NOTE — Telephone Encounter (Signed)
   Chronic Care Management   Unsuccessful Call Note 02/08/2020 Name: Andrea Holden MRN: [id number] DOB: 1945-07-18  Patient  is a 74 year old female who sees Danelle Berry, PA-C  for primary care. Danelle Berry, PA-C asked the CCM team to consult the patient for community resources.   This social worker was unable to reach patient via telephone today for follow up call. I have left HIPAA compliant voicemail asking patient to return my call. (unsuccessful outreach #3).   Plan: This Child psychotherapist will make no additional calls to patient, however will be happy to engage patient upon her return call.    Verna Czech, LCSW [facility] [phone]

## 2020-02-10 ENCOUNTER — Ambulatory Visit: Payer: Self-pay | Admitting: *Deleted

## 2020-02-10 DIAGNOSIS — Z789 Other specified health status: Secondary | ICD-10-CM

## 2020-02-10 DIAGNOSIS — Z598 Other problems related to housing and economic circumstances: Secondary | ICD-10-CM

## 2020-02-10 DIAGNOSIS — Z599 Problem related to housing and economic circumstances, unspecified: Secondary | ICD-10-CM

## 2020-02-10 DIAGNOSIS — F3341 Major depressive disorder, recurrent, in partial remission: Secondary | ICD-10-CM

## 2020-02-10 NOTE — Chronic Care Management (AMB) (Signed)
Chronic Care Management    Clinical Social Work Follow Up Note  02/10/2020 Name: Andrea Holden MRN: [id number] DOB: 1945/08/10  Andrea Holden is a 74 y.o. year old female who is a primary care patient of [name], [place]. The CCM team was consulted for assistance with Community Resources  related to housing.   Review of patient status, including review of consultants reports, other relevant assessments, and collaboration with appropriate care team members and the patient's provider was performed as part of comprehensive patient evaluation and provision of chronic care management services.    SDOH (Social Determinants of Health) assessments performed: No    Outpatient Encounter Medications as of 02/10/2020  Medication Sig Note  . acetaminophen (TYLENOL) 325 MG tablet Take 2 tablets (650 mg total) by mouth every 6 (six) hours as needed for mild pain, moderate pain, headache or fever.   Marland Kitchen albuterol (VENTOLIN HFA) 108 (90 Base) MCG/ACT inhaler Inhale 2 puffs into the lungs every 6 (six) hours as needed for wheezing or shortness of breath.   Marland Kitchen atorvastatin (LIPITOR) 10 MG tablet Take 1 tablet (10 mg total) by mouth at bedtime.   . Cholecalciferol (VITAMIN D3) 250 MCG (10000 UT) TABS Take 10,000 Units by mouth daily.    . clopidogrel (PLAVIX) 75 MG tablet Take 1 tablet (75 mg total) by mouth daily.   . fluticasone (FLONASE) 50 MCG/ACT nasal spray Place 2 sprays into both nostrils daily as needed. 01/06/2020: PRN allergies  . gabapentin (NEURONTIN) 400 MG capsule Take 1 capsule (400 mg total) by mouth 2 (two) times daily.   Marland Kitchen ipratropium-albuterol (DUONEB) 0.5-2.5 (3) MG/3ML SOLN Take 3 mLs by nebulization 3 (three) times daily as needed. (Patient taking differently: Take 3 mLs by nebulization 3 (three) times daily as needed (Shortness of breath). )   . Krill Oil 1000 MG CAPS Take 1,000 mg by mouth every other day.  (Patient not taking: Reported on 01/12/2020)   . lisinopril (ZESTRIL) 10 MG  tablet Take 1 tablet (10 mg total) by mouth daily.   . montelukast (SINGULAIR) 10 MG tablet Take 1 tablet (10 mg total) by mouth at bedtime.   . Multiple Vitamins-Minerals (MULTIVITAMIN ADULTS 50+) TABS Take 1 tablet by mouth daily.   . pantoprazole (PROTONIX) 20 MG tablet Take 1 tablet (20 mg total) by mouth daily.   Marland Kitchen Respiratory Therapy Supplies (NEBULIZER/TUBING/MOUTHPIECE) KIT Disp one nebulizer machine, tubing set and mouthpiece kit   . sertraline (ZOLOFT) 100 MG tablet TAKE 1 AND 1/2 TABLETS (150 MG TOTAL) BY MOUTH DAILY (Patient taking differently: Take 100 mg by mouth daily. )   . terbinafine (LAMISIL) 250 MG tablet Take 1 tablet (250 mg total) by mouth daily.   Marland Kitchen umeclidinium-vilanterol (ANORO ELLIPTA) 62.5-25 MCG/INH AEPB Inhale 1 puff into the lungs daily.   . varenicline (CHANTIX CONTINUING MONTH PAK) 1 MG tablet Take 1 tablet (1 mg total) by mouth 2 (two) times daily. (Patient not taking: Reported on 01/12/2020)   . vitamin C (ASCORBIC ACID) 500 MG tablet Take 1,000 mg by mouth every other day.  (Patient not taking: Reported on 01/12/2020)   . zinc gluconate 50 MG tablet Take 50 mg by mouth daily.    No facility-administered encounter medications on file as of 02/10/2020.     Goals Addressed              This Visit's Progress   .  "I need help finding a new place to live": (pt-stated)  CARE PLAN ENTRY (see longitudinal plan of care for additional care plan information)  Current Barriers:  . Housing barriers  Clinical Social Work Clinical Goal(s):  Marland Kitchen Over the next 90 days, patient will work with the [facility] to address needs related to housing needs  Interventions: . Return phone call from patient who stated that she is still in need of housing . Patient discussed that she has now declined roommate situation and is back looking for her own place . Patient requesting assistance from everyone she can, has 1 lead for a town home in [place] today but will  call tomorrow to follow up . Encouraged patient to continue to follow up on housing resources as they become available . Discussed plan to assist patient with housing resources as they become available . Discussed plans with patient for ongoing care management follow up regarding housing and  plan with patient to call back later on today to check on her.  Patient Self Care Activities:  . Performs ADL's independently . Calls provider office for new concerns or questions . Unable to perform IADLs independently  Please see past updates related to this goal by clicking on the "Past Updates" button in the selected goal          Follow Up Plan: SW will follow up with patient by phone over the next 7-10 business days   Elliot Gurney, [place] Worker  [facility] Care Management [phone]

## 2020-02-10 NOTE — Patient Instructions (Signed)
Thank you allowing the Chronic Care Management Team to be a part of your care! It was a pleasure speaking with you today!  1. Please follow up with apartment in [place] tomorrow  CCM (Chronic Care Management) Team   [name] RN, BSN Nurse Care Coordinator  [phone]  [place], LCSW Clinical Social Worker [phone]  Goals Addressed              This Visit's Progress   .  "I need help finding a new place to live": (pt-stated)        CARE PLAN ENTRY (see longitudinal plan of care for additional care plan information)  Current Barriers:  . Housing barriers  Clinical Social Work Clinical Goal(s):  Marland Kitchen Over the next 90 days, patient will work with the [facility] to address needs related to housing needs  Interventions: . Return phone call from patient who stated that she is still in need of housing . Patient discussed that she has now declined roommate situation and is back looking for her own place . Patient requesting assistance from everyone she can, has 1 lead for a town home in [place] today but will call tomorrow to follow up . Encouraged patient to continue to follow up on housing resources as they become available . Discussed plan to assist patient with housing resources as they become available . Discussed plans with patient for ongoing care management follow up regarding housing and  plan with patient to call back later on today to check on her.  Patient Self Care Activities:  . Performs ADL's independently . Calls provider office for new concerns or questions . Unable to perform IADLs independently  Please see past updates related to this goal by clicking on the "Past Updates" button in the selected goal          The patient verbalized understanding of instructions provided today and declined a print copy of patient instruction materials.   Telephone follow up appointment with care management team member scheduled for:  02/17/20

## 2020-02-11 ENCOUNTER — Encounter: Payer: Self-pay | Admitting: *Deleted

## 2020-02-11 ENCOUNTER — Ambulatory Visit (INDEPENDENT_AMBULATORY_CARE_PROVIDER_SITE_OTHER): Payer: Medicare (Managed Care) | Admitting: *Deleted

## 2020-02-11 DIAGNOSIS — F3341 Major depressive disorder, recurrent, in partial remission: Secondary | ICD-10-CM

## 2020-02-11 DIAGNOSIS — Z598 Other problems related to housing and economic circumstances: Secondary | ICD-10-CM

## 2020-02-11 DIAGNOSIS — Z789 Other specified health status: Secondary | ICD-10-CM

## 2020-02-11 DIAGNOSIS — Z599 Problem related to housing and economic circumstances, unspecified: Secondary | ICD-10-CM

## 2020-02-11 NOTE — Progress Notes (Signed)
This encounter was created in error - please disregard.  This encounter was created in error - please disregard.

## 2020-02-11 NOTE — Progress Notes (Signed)
This encounter was created in error - please disregard.

## 2020-02-11 NOTE — Chronic Care Management (AMB) (Signed)
Chronic Care Management    Clinical Social Work Follow Up Note  02/11/2020 Name: CHANNA HAZELETT MRN: [id number] DOB: July 22, 1945  Alexah J Friedl is a 74 y.o. year old female who is a primary care patient of [name], [place]. The CCM team was consulted for assistance with [name]  for housing.   Review of patient status, including review of consultants reports, other relevant assessments, and collaboration with appropriate care team members and the patient's provider was performed as part of comprehensive patient evaluation and provision of chronic care management services.    SDOH (Social Determinants of Health) assessments performed: No    Outpatient Encounter Medications as of 02/11/2020  Medication Sig Note  . acetaminophen (TYLENOL) 325 MG tablet Take 2 tablets (650 mg total) by mouth every 6 (six) hours as needed for mild pain, moderate pain, headache or fever.   Marland Kitchen albuterol (VENTOLIN HFA) 108 (90 Base) MCG/ACT inhaler Inhale 2 puffs into the lungs every 6 (six) hours as needed for wheezing or shortness of breath.   Marland Kitchen atorvastatin (LIPITOR) 10 MG tablet Take 1 tablet (10 mg total) by mouth at bedtime.   . Cholecalciferol (VITAMIN D3) 250 MCG (10000 UT) TABS Take 10,000 Units by mouth daily.    . clopidogrel (PLAVIX) 75 MG tablet Take 1 tablet (75 mg total) by mouth daily.   . fluticasone (FLONASE) 50 MCG/ACT nasal spray Place 2 sprays into both nostrils daily as needed. 01/06/2020: PRN allergies  . gabapentin (NEURONTIN) 400 MG capsule Take 1 capsule (400 mg total) by mouth 2 (two) times daily.   Marland Kitchen ipratropium-albuterol (DUONEB) 0.5-2.5 (3) MG/3ML SOLN Take 3 mLs by nebulization 3 (three) times daily as needed. (Patient taking differently: Take 3 mLs by nebulization 3 (three) times daily as needed (Shortness of breath). )   . Krill Oil 1000 MG CAPS Take 1,000 mg by mouth every other day.  (Patient not taking: Reported on 01/12/2020)   . lisinopril (ZESTRIL) 10 MG tablet  Take 1 tablet (10 mg total) by mouth daily.   . montelukast (SINGULAIR) 10 MG tablet Take 1 tablet (10 mg total) by mouth at bedtime.   . Multiple Vitamins-Minerals (MULTIVITAMIN ADULTS 50+) TABS Take 1 tablet by mouth daily.   . pantoprazole (PROTONIX) 20 MG tablet Take 1 tablet (20 mg total) by mouth daily.   Marland Kitchen Respiratory Therapy Supplies (NEBULIZER/TUBING/MOUTHPIECE) KIT Disp one nebulizer machine, tubing set and mouthpiece kit   . sertraline (ZOLOFT) 100 MG tablet TAKE 1 AND 1/2 TABLETS (150 MG TOTAL) BY MOUTH DAILY (Patient taking differently: Take 100 mg by mouth daily. )   . terbinafine (LAMISIL) 250 MG tablet Take 1 tablet (250 mg total) by mouth daily.   Marland Kitchen umeclidinium-vilanterol (ANORO ELLIPTA) 62.5-25 MCG/INH AEPB Inhale 1 puff into the lungs daily.   . varenicline (CHANTIX CONTINUING MONTH PAK) 1 MG tablet Take 1 tablet (1 mg total) by mouth 2 (two) times daily. (Patient not taking: Reported on 01/12/2020)   . vitamin C (ASCORBIC ACID) 500 MG tablet Take 1,000 mg by mouth every other day.  (Patient not taking: Reported on 01/12/2020)   . zinc gluconate 50 MG tablet Take 50 mg by mouth daily.    No facility-administered encounter medications on file as of 02/11/2020.     Goals Addressed              This Visit's Progress   .  "I need help finding a new place to live": (pt-stated)  CARE PLAN ENTRY (see longitudinal plan of care for additional care plan information)  Current Barriers:  . Housing barriers  Clinical Social Work Clinical Goal(s):  Marland Kitchen Over the next 90 days, patient will work with the [facility] to address needs related to housing needs  Interventions: . Return phone call from patient who stated that she is still in need of ho . using . Patient discussed interest in [place] [phone] and has called to leave a message for a return call regarding availability . Patient requesting a call from this social worker to the apartment to inquire as  well . Collaboration phone call to [facility] to check availability-VM left for a return call . Encouraged patient to continue to follow up on housing resources as they become available . Discussed plan to assist patient with housing resources as they become available . Encouraged patient to request assistance from friends and family to assist with housing . Positive reinforcement and encouragement provided . Discussed plans with patient for ongoing care management follow up regarding housing and  plan with patient to call back later on today to check on her.  Patient Self Care Activities:  . Performs ADL's independently . Calls provider office for new concerns or questions . Unable to perform IADLs independently  Please see past updates related to this goal by clicking on the "Past Updates" button in the selected goal          Follow Up Plan: SW will follow up with patient by phone over the next 7-10 business days   Elliot Gurney, [place] Worker  [facility] Care Management [phone]

## 2020-02-11 NOTE — Patient Instructions (Addendum)
Thank you allowing the Chronic Care Management Team to be a part of your care! It was a pleasure speaking with you today!  1. Please follow up with [facility] [phone] for availabilities  CCM (Chronic Care Management) Team   [name] RN, BSN Nurse Care Coordinator  [phone]  Verna Czech, LCSW Clinical Social Worker [phone]  Goals Addressed              This Visit's Progress   .  "I need help finding a new place to live": (pt-stated)        CARE PLAN ENTRY (see longitudinal plan of care for additional care plan information)  Current Barriers:  . Housing barriers  Clinical Social Work Clinical Goal(s):  Marland Kitchen Over the next 90 days, patient will work with the [facility] to address needs related to housing needs  Interventions: . Return phone call from patient who stated that she is still in need of ho . using . Patient discussed interest in [place] [phone] and has called to leave a message for a return call regarding availability . Patient requesting a call from this social worker to the apartment to inquire as well . Collaboration phone call to [facility] to check availability-VM left for a return call . Encouraged patient to continue to follow up on housing resources as they become available . Discussed plan to assist patient with housing resources as they become available . Encouraged patient to request assistance from friends and family to assist with housing . Positive reinforcement and encouragement provided . Discussed plans with patient for ongoing care management follow up regarding housing and  plan with patient to call back later on today to check on her.  Patient Self Care Activities:  . Performs ADL's independently . Calls provider office for new concerns or questions . Unable to perform IADLs independently  Please see past updates related to this goal by clicking on the "Past Updates" button in the selected  goal          The patient verbalized understanding of instructions provided today and declined a print copy of patient instruction materials.   Telephone follow up appointment with care management team member scheduled for: 02/14/20

## 2020-02-14 ENCOUNTER — Ambulatory Visit: Payer: Self-pay | Admitting: *Deleted

## 2020-02-14 ENCOUNTER — Telehealth: Payer: Self-pay | Admitting: *Deleted

## 2020-02-14 DIAGNOSIS — F3341 Major depressive disorder, recurrent, in partial remission: Secondary | ICD-10-CM | POA: Diagnosis not present

## 2020-02-14 DIAGNOSIS — Z598 Other problems related to housing and economic circumstances: Secondary | ICD-10-CM

## 2020-02-14 DIAGNOSIS — Z599 Problem related to housing and economic circumstances, unspecified: Secondary | ICD-10-CM

## 2020-02-14 DIAGNOSIS — Z789 Other specified health status: Secondary | ICD-10-CM

## 2020-02-14 NOTE — Telephone Encounter (Signed)
   Chronic Care Management   Unsuccessful Call Note 02/14/2020 Name: SAESHA LLERENAS MRN: [id number] DOB: 1945-10-11  Patient is a 74 year old female who sees Danelle Berry, PA-C for primary care. Danelle Berry, PA-C asked the CCM team to consult the patient for [facility].       This social worker was unable to reach patient via telephone today for follow up call. I have left HIPAA compliant voicemail asking patient to return my call. (unsuccessful outreach #1).   Plan: Will follow-up within 7 business days via telephone.     Verna Czech, LCSW Clinical [facility] Care Management [phone]

## 2020-02-14 NOTE — Chronic Care Management (AMB) (Signed)
Chronic Care Management    Clinical Social Work Follow Up Note  02/14/2020 Name: Andrea Holden MRN: [id number] DOB: [birth year]  Andrea Holden is a 74 y.o. year old female who is a primary care patient of [name], [place]. The CCM team was consulted for assistance with [name] .   Review of patient status, including review of consultants reports, other relevant assessments, and collaboration with appropriate care team members and the patient's provider was performed as part of comprehensive patient evaluation and provision of chronic care management services.    SDOH (Social Determinants of Health) assessments performed: No    Outpatient Encounter Medications as of 02/14/2020  Medication Sig Note  . acetaminophen (TYLENOL) 325 MG tablet Take 2 tablets (650 mg total) by mouth every 6 (six) hours as needed for mild pain, moderate pain, headache or fever.   Marland Kitchen albuterol (VENTOLIN HFA) 108 (90 Base) MCG/ACT inhaler Inhale 2 puffs into the lungs every 6 (six) hours as needed for wheezing or shortness of breath.   Marland Kitchen atorvastatin (LIPITOR) 10 MG tablet Take 1 tablet (10 mg total) by mouth at bedtime.   . Cholecalciferol (VITAMIN D3) 250 MCG (10000 UT) TABS Take 10,000 Units by mouth daily.    . clopidogrel (PLAVIX) 75 MG tablet Take 1 tablet (75 mg total) by mouth daily.   . fluticasone (FLONASE) 50 MCG/ACT nasal spray Place 2 sprays into both nostrils daily as needed. 01/06/2020: PRN allergies  . gabapentin (NEURONTIN) 400 MG capsule Take 1 capsule (400 mg total) by mouth 2 (two) times daily.   Marland Kitchen ipratropium-albuterol (DUONEB) 0.5-2.5 (3) MG/3ML SOLN Take 3 mLs by nebulization 3 (three) times daily as needed. (Patient taking differently: Take 3 mLs by nebulization 3 (three) times daily as needed (Shortness of breath). )   . Krill Oil 1000 MG CAPS Take 1,000 mg by mouth every other day.  (Patient not taking: Reported on 01/12/2020)   . lisinopril (ZESTRIL) 10 MG tablet Take 1  tablet (10 mg total) by mouth daily.   . montelukast (SINGULAIR) 10 MG tablet Take 1 tablet (10 mg total) by mouth at bedtime.   . Multiple Vitamins-Minerals (MULTIVITAMIN ADULTS 50+) TABS Take 1 tablet by mouth daily.   . pantoprazole (PROTONIX) 20 MG tablet Take 1 tablet (20 mg total) by mouth daily.   Marland Kitchen Respiratory Therapy Supplies (NEBULIZER/TUBING/MOUTHPIECE) KIT Disp one nebulizer machine, tubing set and mouthpiece kit   . sertraline (ZOLOFT) 100 MG tablet TAKE 1 AND 1/2 TABLETS (150 MG TOTAL) BY MOUTH DAILY (Patient taking differently: Take 100 mg by mouth daily. )   . terbinafine (LAMISIL) 250 MG tablet Take 1 tablet (250 mg total) by mouth daily.   Marland Kitchen umeclidinium-vilanterol (ANORO ELLIPTA) 62.5-25 MCG/INH AEPB Inhale 1 puff into the lungs daily.   . varenicline (CHANTIX CONTINUING MONTH PAK) 1 MG tablet Take 1 tablet (1 mg total) by mouth 2 (two) times daily. (Patient not taking: Reported on 01/12/2020)   . vitamin C (ASCORBIC ACID) 500 MG tablet Take 1,000 mg by mouth every other day.  (Patient not taking: Reported on 01/12/2020)   . zinc gluconate 50 MG tablet Take 50 mg by mouth daily.    No facility-administered encounter medications on file as of 02/14/2020.     Goals Addressed              This Visit's Progress   .  "I need help finding a new place to live": (pt-stated)  CARE PLAN ENTRY (see longitudinal plan of care for additional care plan information)  Current Barriers:  . Housing barriers  Clinical Social Work Clinical Goal(s):  Marland Kitchen Over the next 90 days, patient will work with the [facility] to address needs related to housing needs  Interventions: Led[employer] Peoples [phone] contacted for availability-spoke with the office manager who stated that they have no availabilities at this time-waiting list of over 200 people noted . Phone call to [phone] spoke with the office manager-they do have availability-phone call to patient to  discuss availabilities, voicemail message left for a return call  Patient Self Care Activities:  . Performs ADL's independently . Calls provider office for new concerns or questions . Unable to perform IADLs independently  Please see past updates related to this goal by clicking on the "Past Updates" button in the selected goal          Follow Up Plan: SW will follow up with patient by phone over the next 7-10 business days   [place], [place] Worker  [facility] [phone]

## 2020-02-14 NOTE — Chronic Care Management (AMB) (Signed)
Chronic Care Management    Clinical Social Work Follow Up Note  02/14/2020 Name: MYLEIGH AMARA MRN: [id number] DOB: 10/17/1945  Elandra J Patman is a 74 y.o. year old female who is a primary care patient of [name], [place]. The CCM team was consulted for assistance with [name] .   Review of patient status, including review of consultants reports, other relevant assessments, and collaboration with appropriate care team members and the patient's provider was performed as part of comprehensive patient evaluation and provision of chronic care management services.    SDOH (Social Determinants of Health) assessments performed: No     Outpatient Encounter Medications as of 02/14/2020  Medication Sig Note  . acetaminophen (TYLENOL) 325 MG tablet Take 2 tablets (650 mg total) by mouth every 6 (six) hours as needed for mild pain, moderate pain, headache or fever.   Marland Kitchen albuterol (VENTOLIN HFA) 108 (90 Base) MCG/ACT inhaler Inhale 2 puffs into the lungs every 6 (six) hours as needed for wheezing or shortness of breath.   Marland Kitchen atorvastatin (LIPITOR) 10 MG tablet Take 1 tablet (10 mg total) by mouth at bedtime.   . Cholecalciferol (VITAMIN D3) 250 MCG (10000 UT) TABS Take 10,000 Units by mouth daily.    . clopidogrel (PLAVIX) 75 MG tablet Take 1 tablet (75 mg total) by mouth daily.   . fluticasone (FLONASE) 50 MCG/ACT nasal spray Place 2 sprays into both nostrils daily as needed. 01/06/2020: PRN allergies  . gabapentin (NEURONTIN) 400 MG capsule Take 1 capsule (400 mg total) by mouth 2 (two) times daily.   Marland Kitchen ipratropium-albuterol (DUONEB) 0.5-2.5 (3) MG/3ML SOLN Take 3 mLs by nebulization 3 (three) times daily as needed. (Patient taking differently: Take 3 mLs by nebulization 3 (three) times daily as needed (Shortness of breath). )   . Krill Oil 1000 MG CAPS Take 1,000 mg by mouth every other day.  (Patient not taking: Reported on 01/12/2020)   . lisinopril (ZESTRIL) 10 MG tablet Take 1  tablet (10 mg total) by mouth daily.   . montelukast (SINGULAIR) 10 MG tablet Take 1 tablet (10 mg total) by mouth at bedtime.   . Multiple Vitamins-Minerals (MULTIVITAMIN ADULTS 50+) TABS Take 1 tablet by mouth daily.   . pantoprazole (PROTONIX) 20 MG tablet Take 1 tablet (20 mg total) by mouth daily.   Marland Kitchen Respiratory Therapy Supplies (NEBULIZER/TUBING/MOUTHPIECE) KIT Disp one nebulizer machine, tubing set and mouthpiece kit   . sertraline (ZOLOFT) 100 MG tablet TAKE 1 AND 1/2 TABLETS (150 MG TOTAL) BY MOUTH DAILY (Patient taking differently: Take 100 mg by mouth daily. )   . terbinafine (LAMISIL) 250 MG tablet Take 1 tablet (250 mg total) by mouth daily.   Marland Kitchen umeclidinium-vilanterol (ANORO ELLIPTA) 62.5-25 MCG/INH AEPB Inhale 1 puff into the lungs daily.   . varenicline (CHANTIX CONTINUING MONTH PAK) 1 MG tablet Take 1 tablet (1 mg total) by mouth 2 (two) times daily. (Patient not taking: Reported on 01/12/2020)   . vitamin C (ASCORBIC ACID) 500 MG tablet Take 1,000 mg by mouth every other day.  (Patient not taking: Reported on 01/12/2020)   . zinc gluconate 50 MG tablet Take 50 mg by mouth daily.    No facility-administered encounter medications on file as of 02/14/2020.     Goals Addressed              This Visit's Progress   .  "I need help finding a new place to live": (pt-stated)  CARE PLAN ENTRY (see longitudinal plan of care for additional care plan information)  Current Barriers:  . Housing barriers  Clinical Social Work Clinical Goal(s):  Marland Kitchen Over the next 90 days, patient will work with the [facility] to address needs related to housing needs  Interventions: . Return phone call from patient to discuss information related to [facility] -[phone] contacted for availability-spoke with the office manager who stated that they have no availabilities at this time-waiting list of over 200 people noted . Information provided for [phone] spoke  with the office manager-they do have availability-patient encouraged to call to follow up on available rentals . Patient stated that she had another possibility for a rental but does not seem to be able to remember the name of the realty company-patient encouraged to call the friend that she received the information from to see about getting the information to her again . Continued to encourage patient to follow up with realty companies for rental availabilities . Return phone call from patient, name of reality company found-[employer] [phone]. Patient encouraged to call as soon as possible  Patient Self Care Activities:  . Performs ADL's independently . Calls provider office for new concerns or questions . Unable to perform IADLs independently  Please see past updates related to this goal by clicking on the "Past Updates" button in the selected goal          Follow Up Plan: SW will follow up with patient by phone over the next 7-10 business days   Elliot Gurney, [place] Worker  [facility] Care Management [phone]

## 2020-02-14 NOTE — Patient Instructions (Addendum)
Thank you allowing the Chronic Care Management Team to be a part of your care! It was a pleasure speaking with you today!  1. Please follow up with Andrea Holden [phone] regarding rental possibilities  CCM (Chronic Care Management) Team   Andrea Fairly RN, BSN Nurse Care Coordinator  [phone]  Andrea Holden [address] LCSW Clinical Social Worker [phone]  Goals Addressed              This Visit's Progress   .  "I need help finding a new place to live": (pt-stated)        CARE PLAN ENTRY (see longitudinal plan of care for additional care plan information)  Current Barriers:  . Housing barriers  Clinical Social Work Clinical Goal(s):  Marland Kitchen Over the next 90 days, patient will work with the [facility] to address needs related to housing needs  Interventions: . Return phone call from patient to discuss information related to [facility] [phone] contacted for availability-spoke with the office manager who stated that they have no availabilities at this time-waiting list of over 200 people noted . Information provided for [employer]-[phone] spoke with the office manager-they do have availability-patient encouraged to call to follow up on available rentals . Patient stated that she had another possibility for a rental but does not seem to be able to remember the name of the realty company-patient encouraged to call the friend that she received the information from to see about getting the information to her again . Continued to encourage patient to follow up with realty companies for rental availabilities  Patient Self Care Activities:  . Performs ADL's independently . Calls provider office for new concerns or questions . Unable to perform IADLs independently  Please see past updates related to this goal by clicking on the "Past Updates" button in the selected goal          The patient verbalized understanding of instructions provided today and  declined a print copy of patient instruction materials.   Telephone follow up appointment with care management team member scheduled for: 02/17/20

## 2020-02-15 ENCOUNTER — Telehealth: Payer: Self-pay

## 2020-02-15 ENCOUNTER — Ambulatory Visit: Payer: Self-pay | Admitting: *Deleted

## 2020-02-15 DIAGNOSIS — Z598 Other problems related to housing and economic circumstances: Secondary | ICD-10-CM

## 2020-02-15 DIAGNOSIS — Z599 Problem related to housing and economic circumstances, unspecified: Secondary | ICD-10-CM

## 2020-02-15 DIAGNOSIS — F3341 Major depressive disorder, recurrent, in partial remission: Secondary | ICD-10-CM

## 2020-02-15 DIAGNOSIS — Z789 Other specified health status: Secondary | ICD-10-CM

## 2020-02-15 NOTE — Patient Instructions (Signed)
Thank you allowing the Chronic Care Management Team to be a part of your care! It was a pleasure speaking with you today!  1. Please obtain proof of income, shot records and photos of each pet, copy of driver's license, and application fee (07.57)  CCM (Chronic Care Management) Team   Neldon Labella  RN, BSN Nurse Care Coordinator  [phone]  [place], [place] Social Worker [phone]  Goals Addressed              This Visit's Progress   .  "I need help finding a new place to live": (pt-stated)        [place] (see longitudinal plan of care for additional care plan information)  Current Barriers:  . Housing barriers  Clinical Social Work Clinical Goal(s):  Marland Kitchen Over the next 90 days, patient will work with the [facility] to address needs related to housing needs  Interventions: Marland Kitchen Met with patient in the office to assist with completing the rental application through [employer]. They have available rentals, application could not be completed as patient will need the  $02.54 application fee, proof of income, shot record for her pets, photo of each pet and copy of drivers license . Patient will have to return to the office once required information is obtained . Per [employer] rentals, patient will be notified by email regarding status of application once fully submitted   Patient Self Care Activities:  . Performs ADL's independently . Calls provider office for new concerns or questions . Unable to perform IADLs independently  Please see past updates related to this goal by clicking on the "Past Updates" button in the selected goal          The patient verbalized understanding of instructions provided today and declined a print copy of patient instruction materials.   Face to Face appointment with care management team member scheduled for:   02/21/20

## 2020-02-15 NOTE — Chronic Care Management (AMB) (Signed)
Chronic Care Management    Clinical Social Work Follow Up Note  02/15/2020 Name: Andrea Holden MRN: [id number] DOB: February 02, 1946  Andrea Holden is a 74 y.o. year old female who is a primary care patient of [name], [place]. The CCM team was consulted for assistance with [name] .   Review of patient status, including review of consultants reports, other relevant assessments, and collaboration with appropriate care team members and the patient's provider was performed as part of comprehensive patient evaluation and provision of chronic care management services.    SDOH (Social Determinants of Health) assessments performed: No    Outpatient Encounter Medications as of 02/15/2020  Medication Sig Note  . acetaminophen (TYLENOL) 325 MG tablet Take 2 tablets (650 mg total) by mouth every 6 (six) hours as needed for mild pain, moderate pain, headache or fever.   Marland Kitchen albuterol (VENTOLIN HFA) 108 (90 Base) MCG/ACT inhaler Inhale 2 puffs into the lungs every 6 (six) hours as needed for wheezing or shortness of breath.   Marland Kitchen atorvastatin (LIPITOR) 10 MG tablet Take 1 tablet (10 mg total) by mouth at bedtime.   . Cholecalciferol (VITAMIN D3) 250 MCG (10000 UT) TABS Take 10,000 Units by mouth daily.    . clopidogrel (PLAVIX) 75 MG tablet Take 1 tablet (75 mg total) by mouth daily.   . fluticasone (FLONASE) 50 MCG/ACT nasal spray Place 2 sprays into both nostrils daily as needed. 01/06/2020: PRN allergies  . gabapentin (NEURONTIN) 400 MG capsule Take 1 capsule (400 mg total) by mouth 2 (two) times daily.   Marland Kitchen ipratropium-albuterol (DUONEB) 0.5-2.5 (3) MG/3ML SOLN Take 3 mLs by nebulization 3 (three) times daily as needed. (Patient taking differently: Take 3 mLs by nebulization 3 (three) times daily as needed (Shortness of breath). )   . Krill Oil 1000 MG CAPS Take 1,000 mg by mouth every other day.  (Patient not taking: Reported on 01/12/2020)   . lisinopril (ZESTRIL) 10 MG tablet Take 1  tablet (10 mg total) by mouth daily.   . montelukast (SINGULAIR) 10 MG tablet Take 1 tablet (10 mg total) by mouth at bedtime.   . Multiple Vitamins-Minerals (MULTIVITAMIN ADULTS 50+) TABS Take 1 tablet by mouth daily.   . pantoprazole (PROTONIX) 20 MG tablet Take 1 tablet (20 mg total) by mouth daily.   Marland Kitchen Respiratory Therapy Supplies (NEBULIZER/TUBING/MOUTHPIECE) KIT Disp one nebulizer machine, tubing set and mouthpiece kit   . sertraline (ZOLOFT) 100 MG tablet TAKE 1 AND 1/2 TABLETS (150 MG TOTAL) BY MOUTH DAILY (Patient taking differently: Take 100 mg by mouth daily. )   . terbinafine (LAMISIL) 250 MG tablet Take 1 tablet (250 mg total) by mouth daily.   Marland Kitchen umeclidinium-vilanterol (ANORO ELLIPTA) 62.5-25 MCG/INH AEPB Inhale 1 puff into the lungs daily.   . varenicline (CHANTIX CONTINUING MONTH PAK) 1 MG tablet Take 1 tablet (1 mg total) by mouth 2 (two) times daily. (Patient not taking: Reported on 01/12/2020)   . vitamin C (ASCORBIC ACID) 500 MG tablet Take 1,000 mg by mouth every other day.  (Patient not taking: Reported on 01/12/2020)   . zinc gluconate 50 MG tablet Take 50 mg by mouth daily.    No facility-administered encounter medications on file as of 02/15/2020.     Goals Addressed              This Visit's Progress   .  "I need help finding a new place to live": (pt-stated)  CARE PLAN ENTRY (see longitudinal plan of care for additional care plan information)  Current Barriers:  . Housing barriers  Clinical Social Work Clinical Goal(s):  Marland Kitchen Over the next 90 days, patient will work with the [facility] to address needs related to housing needs  Interventions: . Phone call to [facility] [phone]. They have available rentals, patient will have to apply online and pay a $21.94 application fee.  . Per [employer], patient will be notified by email regarding status of application . Patient requesting assistance with online application-appointment scheduled for today  at 2pm to provide assistance with completing application   Patient Self Care Activities:  . Performs ADL's independently . Calls provider office for new concerns or questions . Unable to perform IADLs independently  Please see past updates related to this goal by clicking on the "Past Updates" button in the selected goal          Follow Up Plan: Appointment scheduled for SW to meet with client in provider office on: 02/15/20   Elliot Gurney, [place] Worker  [facility] [phone]

## 2020-02-15 NOTE — Progress Notes (Signed)
A user error has taken place: encounter opened in error, closed for administrative reasons.

## 2020-02-15 NOTE — Telephone Encounter (Cosign Needed)
A user error has taken place: encounter opened in error, closed for administrative reasons  Previously completed.

## 2020-02-15 NOTE — Chronic Care Management (AMB) (Signed)
Chronic Care Management    Clinical Social Work Follow Up Note  02/15/2020 Name: Andrea Holden MRN: [id number] DOB: February 02, 1946  Andrea Holden is a 74 y.o. year old female who is a primary care patient of [name], [place]. The CCM team was consulted for assistance with [name] .   Review of patient status, including review of consultants reports, other relevant assessments, and collaboration with appropriate care team members and the patient's provider was performed as part of comprehensive patient evaluation and provision of chronic care management services.    SDOH (Social Determinants of Health) assessments performed: No    Outpatient Encounter Medications as of 02/15/2020  Medication Sig Note  . acetaminophen (TYLENOL) 325 MG tablet Take 2 tablets (650 mg total) by mouth every 6 (six) hours as needed for mild pain, moderate pain, headache or fever.   Marland Kitchen albuterol (VENTOLIN HFA) 108 (90 Base) MCG/ACT inhaler Inhale 2 puffs into the lungs every 6 (six) hours as needed for wheezing or shortness of breath.   Marland Kitchen atorvastatin (LIPITOR) 10 MG tablet Take 1 tablet (10 mg total) by mouth at bedtime.   . Cholecalciferol (VITAMIN D3) 250 MCG (10000 UT) TABS Take 10,000 Units by mouth daily.    . clopidogrel (PLAVIX) 75 MG tablet Take 1 tablet (75 mg total) by mouth daily.   . fluticasone (FLONASE) 50 MCG/ACT nasal spray Place 2 sprays into both nostrils daily as needed. 01/06/2020: PRN allergies  . gabapentin (NEURONTIN) 400 MG capsule Take 1 capsule (400 mg total) by mouth 2 (two) times daily.   Marland Kitchen ipratropium-albuterol (DUONEB) 0.5-2.5 (3) MG/3ML SOLN Take 3 mLs by nebulization 3 (three) times daily as needed. (Patient taking differently: Take 3 mLs by nebulization 3 (three) times daily as needed (Shortness of breath). )   . Krill Oil 1000 MG CAPS Take 1,000 mg by mouth every other day.  (Patient not taking: Reported on 01/12/2020)   . lisinopril (ZESTRIL) 10 MG tablet Take 1  tablet (10 mg total) by mouth daily.   . montelukast (SINGULAIR) 10 MG tablet Take 1 tablet (10 mg total) by mouth at bedtime.   . Multiple Vitamins-Minerals (MULTIVITAMIN ADULTS 50+) TABS Take 1 tablet by mouth daily.   . pantoprazole (PROTONIX) 20 MG tablet Take 1 tablet (20 mg total) by mouth daily.   Marland Kitchen Respiratory Therapy Supplies (NEBULIZER/TUBING/MOUTHPIECE) KIT Disp one nebulizer machine, tubing set and mouthpiece kit   . sertraline (ZOLOFT) 100 MG tablet TAKE 1 AND 1/2 TABLETS (150 MG TOTAL) BY MOUTH DAILY (Patient taking differently: Take 100 mg by mouth daily. )   . terbinafine (LAMISIL) 250 MG tablet Take 1 tablet (250 mg total) by mouth daily.   Marland Kitchen umeclidinium-vilanterol (ANORO ELLIPTA) 62.5-25 MCG/INH AEPB Inhale 1 puff into the lungs daily.   . varenicline (CHANTIX CONTINUING MONTH PAK) 1 MG tablet Take 1 tablet (1 mg total) by mouth 2 (two) times daily. (Patient not taking: Reported on 01/12/2020)   . vitamin C (ASCORBIC ACID) 500 MG tablet Take 1,000 mg by mouth every other day.  (Patient not taking: Reported on 01/12/2020)   . zinc gluconate 50 MG tablet Take 50 mg by mouth daily.    No facility-administered encounter medications on file as of 02/15/2020.     Goals Addressed              This Visit's Progress   .  "I need help finding a new place to live": (pt-stated)  CARE PLAN ENTRY (see longitudinal plan of care for additional care plan information)  Current Barriers:  . Housing barriers  Clinical Social Work Clinical Goal(s):  Marland Kitchen Over the next 90 days, patient will work with the [facility] to address needs related to housing needs  Interventions: Marland Kitchen Met with patient in the office to assist with completing the rental application through [employer]. They have available rentals, application could not be completed as patient will need the  $22.97 application fee, proof of income, shot record for her pets, photo of each pet and copy of drivers  license . Patient will have to return to the office once required information is obtained . Per [employer] rentals, patient will be notified by email regarding status of application once fully submitted   Patient Self Care Activities:  . Performs ADL's independently . Calls provider office for new concerns or questions . Unable to perform IADLs independently  Please see past updates related to this goal by clicking on the "Past Updates" button in the selected goal          Follow Up Plan: SW will follow up with patient by phone over the next 7-10 business days   Elliot Gurney, [place] Worker  [facility] Care Management [phone]

## 2020-02-17 ENCOUNTER — Telehealth: Payer: Self-pay

## 2020-02-17 ENCOUNTER — Ambulatory Visit: Payer: Self-pay | Admitting: *Deleted

## 2020-02-17 ENCOUNTER — Telehealth: Payer: Self-pay | Admitting: *Deleted

## 2020-02-17 DIAGNOSIS — Z599 Problem related to housing and economic circumstances, unspecified: Secondary | ICD-10-CM

## 2020-02-17 DIAGNOSIS — Z789 Other specified health status: Secondary | ICD-10-CM

## 2020-02-17 DIAGNOSIS — F3341 Major depressive disorder, recurrent, in partial remission: Secondary | ICD-10-CM

## 2020-02-17 DIAGNOSIS — Z598 Other problems related to housing and economic circumstances: Secondary | ICD-10-CM

## 2020-02-17 NOTE — Patient Instructions (Signed)
Thank you allowing the Chronic Care Management Team to be a part of your care! It was a pleasure speaking with you today!  1. Please bring all required documentation to complete your apartment application   CCM (Chronic Care Management) Team   [name] RN, BSN Nurse Care Coordinator  [phone]   Kenshin Splawn [address] LCSW Clinical Social Worker [phone]  Goals Addressed              This Visit's Progress   .  "I need help finding a new place to live": (pt-stated)        CARE PLAN ENTRY (see longitudinal plan of care for additional care plan information)  Current Barriers:  . Housing barriers  Clinical Social Work Clinical Goal(s):  Marland Kitchen Over the next 90 days, patient will work with the [facility] to address needs related to housing needs  Interventions: . Phone call from patient stating that she had the rest of the required documents to complete rental application including the   $45.00 application fee, proof of income, shot record for her pets, photo of each pet and copy of drivers license . Patient scheduled to return to the office on 02/18/20 to complete application . Per [employer], patient will be notified by email regarding status of application once fully submitted   Patient Self Care Activities:  . Performs ADL's independently . Calls provider office for new concerns or questions . Unable to perform IADLs independently  Please see past updates related to this goal by clicking on the "Past Updates" button in the selected goal          The patient verbalized understanding of instructions provided today and declined a print copy of patient instruction materials.   Face to Face appointment with care management team member scheduled for:  02/18/20

## 2020-02-17 NOTE — Chronic Care Management (AMB) (Signed)
Chronic Care Management    Clinical Social Work Follow Up Note  02/17/2020 Name: Andrea Holden MRN: [id number] DOB: 1946-04-08  Andrea Holden is a 74 y.o. year old female who is a primary care patient of [name], [place]. The CCM team was consulted for assistance with [name] .   Review of patient status, including review of consultants reports, other relevant assessments, and collaboration with appropriate care team members and the patient's provider was performed as part of comprehensive patient evaluation and provision of chronic care management services.    SDOH (Social Determinants of Health) assessments performed: No    Outpatient Encounter Medications as of 02/17/2020  Medication Sig Note   acetaminophen (TYLENOL) 325 MG tablet Take 2 tablets (650 mg total) by mouth every 6 (six) hours as needed for mild pain, moderate pain, headache or fever.    albuterol (VENTOLIN HFA) 108 (90 Base) MCG/ACT inhaler Inhale 2 puffs into the lungs every 6 (six) hours as needed for wheezing or shortness of breath.    atorvastatin (LIPITOR) 10 MG tablet Take 1 tablet (10 mg total) by mouth at bedtime.    Cholecalciferol (VITAMIN D3) 250 MCG (10000 UT) TABS Take 10,000 Units by mouth daily.     clopidogrel (PLAVIX) 75 MG tablet Take 1 tablet (75 mg total) by mouth daily.    fluticasone (FLONASE) 50 MCG/ACT nasal spray Place 2 sprays into both nostrils daily as needed. 01/06/2020: PRN allergies   gabapentin (NEURONTIN) 400 MG capsule Take 1 capsule (400 mg total) by mouth 2 (two) times daily.    ipratropium-albuterol (DUONEB) 0.5-2.5 (3) MG/3ML SOLN Take 3 mLs by nebulization 3 (three) times daily as needed. (Patient taking differently: Take 3 mLs by nebulization 3 (three) times daily as needed (Shortness of breath). )    Krill Oil 1000 MG CAPS Take 1,000 mg by mouth every other day.  (Patient not taking: Reported on 01/12/2020)    lisinopril (ZESTRIL) 10 MG tablet Take 1  tablet (10 mg total) by mouth daily.    montelukast (SINGULAIR) 10 MG tablet Take 1 tablet (10 mg total) by mouth at bedtime.    Multiple Vitamins-Minerals (MULTIVITAMIN ADULTS 50+) TABS Take 1 tablet by mouth daily.    pantoprazole (PROTONIX) 20 MG tablet Take 1 tablet (20 mg total) by mouth daily.    Respiratory Therapy Supplies (NEBULIZER/TUBING/MOUTHPIECE) KIT Disp one nebulizer machine, tubing set and mouthpiece kit    sertraline (ZOLOFT) 100 MG tablet TAKE 1 AND 1/2 TABLETS (150 MG TOTAL) BY MOUTH DAILY (Patient taking differently: Take 100 mg by mouth daily. )    terbinafine (LAMISIL) 250 MG tablet Take 1 tablet (250 mg total) by mouth daily.    umeclidinium-vilanterol (ANORO ELLIPTA) 62.5-25 MCG/INH AEPB Inhale 1 puff into the lungs daily.    varenicline (CHANTIX CONTINUING MONTH PAK) 1 MG tablet Take 1 tablet (1 mg total) by mouth 2 (two) times daily. (Patient not taking: Reported on 01/12/2020)    vitamin C (ASCORBIC ACID) 500 MG tablet Take 1,000 mg by mouth every other day.  (Patient not taking: Reported on 01/12/2020)    zinc gluconate 50 MG tablet Take 50 mg by mouth daily.    No facility-administered encounter medications on file as of 02/17/2020.     Goals Addressed              This Visit's Progress     "I need help finding a new place to live": (pt-stated)  CARE PLAN ENTRY (see longitudinal plan of care for additional care plan information)  Current Barriers:   Housing barriers  Clinical Social Work Clinical Goal(s):   Over the next 90 days, patient will work with the [facility] to address needs related to housing needs  Interventions:  Phone call from patient stating that she had the rest of the required documents to complete rental application including the   $83.15 application fee, proof of income, shot record for her pets, photo of each pet and copy of drivers license  Patient scheduled to return to the office on 02/18/20 to complete  application  Per [employer], patient will be notified by email regarding status of application once fully submitted   Patient Self Care Activities:   Performs ADL's independently  Calls provider office for new concerns or questions  Unable to perform IADLs independently  Please see past updates related to this goal by clicking on the "Past Updates" button in the selected goal          Follow Up Plan: Appointment scheduled for SW to meet with client in provider office on: 02/18/20   Elliot Gurney, [place] Worker  [facility] Care Management [phone]

## 2020-02-17 NOTE — Telephone Encounter (Signed)
   Chronic Care Management   Unsuccessful Call Note 02/17/2020 Name: Andrea Holden MRN: [id number] DOB: 08/20/1945  Patient  is a 74 year old female who sees Danelle Berry, PA-C  for primary care. Danelle Berry, PA-C asked the CCM team to consult the patient for community resources.      Return call to patient to scheduled appointment to provide continued assistance with housing applications. This social worker was unable to reach patient via telephone today. I have left HIPAA compliant voicemail asking patient to return my call. (unsuccessful outreach #1).   Plan: Will follow-up within 7 business days via telephone.     Verna Czech, LCSW Clinical [facility] [phone]

## 2020-02-18 ENCOUNTER — Ambulatory Visit: Payer: Self-pay | Admitting: *Deleted

## 2020-02-18 DIAGNOSIS — Z789 Other specified health status: Secondary | ICD-10-CM

## 2020-02-18 DIAGNOSIS — F3341 Major depressive disorder, recurrent, in partial remission: Secondary | ICD-10-CM

## 2020-02-18 DIAGNOSIS — Z599 Problem related to housing and economic circumstances, unspecified: Secondary | ICD-10-CM

## 2020-02-18 DIAGNOSIS — Z598 Other problems related to housing and economic circumstances: Secondary | ICD-10-CM

## 2020-02-18 NOTE — Patient Instructions (Signed)
Thank you allowing the Chronic Care Management Team to be a part of your care! It was a pleasure speaking with you today!  1. Please continue to follow up with housing resources previously provided  CCM (Chronic Care Management) Team   Neldon Labella  RN, BSN Nurse Care Coordinator  [phone]  [place], [place] Social Worker [phone]  Goals Addressed              This Visit's Progress   .  "I need help finding a new place to live": (pt-stated)        [place] (see longitudinal plan of care for additional care plan information)  Current Barriers:  . Housing barriers  Clinical Social Work Clinical Goal(s):  Marland Kitchen Over the next 90 days, patient will work with the [facility] to address needs related to housing needs  Interventions: . Patient met with this social worker to obtain assistance with housing application, however it was confirmed that the apartment she was interested in had already been rented . This Education officer, museum assisted patient with preliminary housing search, multiple properties contacted, however availabilities are extremely limited. One apartment identified as having immediate openings and will accept her pets, however once toured patient declined . [employer] also contacted, however patient would have to be actively homeless before assistance can be provided . Patient very emotional regarding her housing and is hopeful that this social worker would be more available to assist with her housing . Patient reminded that obtaining housing is ultimately her responsibility and that this social worker will be available for assistance and guidance but would not be able to find housing for her    Patient Self Care Activities:  . Performs ADL's independently . Calls provider office for new concerns or questions . Unable to perform IADLs independently  Please see past updates related to this goal by clicking on the "Past Updates" button  in the selected goal          The patient verbalized understanding of instructions provided today and declined a print copy of patient instruction materials.   Telephone follow up appointment with care management team member scheduled for:  02/24/20

## 2020-02-18 NOTE — Chronic Care Management (AMB) (Signed)
Chronic Care Management    Clinical Social Work Follow Up Note  02/18/2020 Name: Andrea Holden MRN: [id number] DOB: [birth year]  Andrea Holden is a 74 y.o. year old female who is a primary care patient of [name], [place]. The CCM team was consulted for assistance with [name] .   Review of patient status, including review of consultants reports, other relevant assessments, and collaboration with appropriate care team members and the patient's provider was performed as part of comprehensive patient evaluation and provision of chronic care management services.    SDOH (Social Determinants of Health) assessments performed: No    Outpatient Encounter Medications as of 02/18/2020  Medication Sig Note  . acetaminophen (TYLENOL) 325 MG tablet Take 2 tablets (650 mg total) by mouth every 6 (six) hours as needed for mild pain, moderate pain, headache or fever.   Marland Kitchen albuterol (VENTOLIN HFA) 108 (90 Base) MCG/ACT inhaler Inhale 2 puffs into the lungs every 6 (six) hours as needed for wheezing or shortness of breath.   Marland Kitchen atorvastatin (LIPITOR) 10 MG tablet Take 1 tablet (10 mg total) by mouth at bedtime.   . Cholecalciferol (VITAMIN D3) 250 MCG (10000 UT) TABS Take 10,000 Units by mouth daily.    . clopidogrel (PLAVIX) 75 MG tablet Take 1 tablet (75 mg total) by mouth daily.   . fluticasone (FLONASE) 50 MCG/ACT nasal spray Place 2 sprays into both nostrils daily as needed. 01/06/2020: PRN allergies  . gabapentin (NEURONTIN) 400 MG capsule Take 1 capsule (400 mg total) by mouth 2 (two) times daily.   Marland Kitchen ipratropium-albuterol (DUONEB) 0.5-2.5 (3) MG/3ML SOLN Take 3 mLs by nebulization 3 (three) times daily as needed. (Patient taking differently: Take 3 mLs by nebulization 3 (three) times daily as needed (Shortness of breath). )   . Krill Oil 1000 MG CAPS Take 1,000 mg by mouth every other day.  (Patient not taking: Reported on 01/12/2020)   . lisinopril (ZESTRIL) 10 MG tablet Take 1  tablet (10 mg total) by mouth daily.   . montelukast (SINGULAIR) 10 MG tablet Take 1 tablet (10 mg total) by mouth at bedtime.   . Multiple Vitamins-Minerals (MULTIVITAMIN ADULTS 50+) TABS Take 1 tablet by mouth daily.   . pantoprazole (PROTONIX) 20 MG tablet Take 1 tablet (20 mg total) by mouth daily.   Marland Kitchen Respiratory Therapy Supplies (NEBULIZER/TUBING/MOUTHPIECE) KIT Disp one nebulizer machine, tubing set and mouthpiece kit   . sertraline (ZOLOFT) 100 MG tablet TAKE 1 AND 1/2 TABLETS (150 MG TOTAL) BY MOUTH DAILY (Patient taking differently: Take 100 mg by mouth daily. )   . terbinafine (LAMISIL) 250 MG tablet Take 1 tablet (250 mg total) by mouth daily.   Marland Kitchen umeclidinium-vilanterol (ANORO ELLIPTA) 62.5-25 MCG/INH AEPB Inhale 1 puff into the lungs daily.   . varenicline (CHANTIX CONTINUING MONTH PAK) 1 MG tablet Take 1 tablet (1 mg total) by mouth 2 (two) times daily. (Patient not taking: Reported on 01/12/2020)   . vitamin C (ASCORBIC ACID) 500 MG tablet Take 1,000 mg by mouth every other day.  (Patient not taking: Reported on 01/12/2020)   . zinc gluconate 50 MG tablet Take 50 mg by mouth daily.    No facility-administered encounter medications on file as of 02/18/2020.     Goals Addressed              This Visit's Progress   .  "I need help finding a new place to live": (pt-stated)  CARE PLAN ENTRY (see longitudinal plan of care for additional care plan information)  Current Barriers:  . Housing barriers  Clinical Social Work Clinical Goal(s):  Marland Kitchen Over the next 90 days, patient will work with the [facility] to address needs related to housing needs  Interventions: . Patient met with this social worker to obtain assistance with housing application, however it was confirmed that the apartment she was interested in had already been rented . This Education officer, museum assisted patient with preliminary housing search, multiple properties contacted, however availabilities are extremely  limited. One apartment identified as having immediate openings and will accept her pets, however once toured patient declined . [employer] also contacted, however patient would have to be actively homeless before assistance can be provided . Patient very emotional regarding her housing and is hopeful that this social worker would be more available to assist with her housing . Patient reminded that obtaining housing is ultimately her responsibility and that this social worker will be available for assistance and guidance but would not be able to find housing for her    Patient Self Care Activities:  . Performs ADL's independently . Calls provider office for new concerns or questions . Unable to perform IADLs independently  Please see past updates related to this goal by clicking on the "Past Updates" button in the selected goal          Follow Up Plan: SW will follow up with patient by phone over the next 14 days   [place], [place] Worker  [facility] Care Management [phone]

## 2020-02-21 ENCOUNTER — Ambulatory Visit: Payer: Self-pay

## 2020-02-23 ENCOUNTER — Telehealth: Payer: Self-pay

## 2020-02-23 NOTE — Telephone Encounter (Signed)
Copied from CRM [id number]. Topic: General - Other >> Feb 23, 2020  3:39 PM Dalphine Handing A wrote: Thayer Ohm with [facility] wants to know if Angelica Chessman could prescribe patient the generic form of Chantix and write prescription as though patient is taking the starter pack due to pharmacy having starter pack on back order for quite some time. Best contact is [phone]

## 2020-02-24 ENCOUNTER — Ambulatory Visit: Payer: Self-pay | Admitting: *Deleted

## 2020-02-24 DIAGNOSIS — Z599 Problem related to housing and economic circumstances, unspecified: Secondary | ICD-10-CM

## 2020-02-24 DIAGNOSIS — F3341 Major depressive disorder, recurrent, in partial remission: Secondary | ICD-10-CM

## 2020-02-24 DIAGNOSIS — Z789 Other specified health status: Secondary | ICD-10-CM

## 2020-02-24 NOTE — Patient Instructions (Signed)
Thank you allowing the Chronic Care Management Team to be a part of your care! It was a pleasure speaking with you today!  1. Please continue to follow up on all housing resources  CCM (Chronic Care Management) Team   [name] RN, BSN Nurse Care Coordinator  [phone]  Westen Dinino [address] LCSW Clinical Social Worker [phone]  Goals Addressed              This Visit's Progress   .  "I need help finding a new place to live": (pt-stated)        CARE PLAN ENTRY (see longitudinal plan of care for additional care plan information)  Current Barriers:  . Housing barriers  Clinical Social Work Clinical Goal(s):  Marland Kitchen Over the next 90 days, patient will work with the [facility] to address needs related to housing needs  Interventions: . Follow up phone call to patient regarding housing status . Per patient, she has a couple of friends that are assisting her in apartment search . Per patient, she has not found anything yet-needs a ground floor apartment with no stairs due to mobility issues and pets . Patient remains very emotional, disappointed with the lack of assistance she is receiving from family and friends . Emotional support provided, patient continues to be reminded that obtaining housing is ultimately her responsibility and that this social worker will be available for resource assistance however would not be able to personally secure housing for her    Patient Self Care Activities:  . Performs ADL's independently . Calls provider office for new concerns or questions . Unable to perform IADLs independently  Please see past updates related to this goal by clicking on the "Past Updates" button in the selected goal          The patient verbalized understanding of instructions provided today and declined a print copy of patient instruction materials.   Telephone follow up appointment with care management team member scheduled for:  03/09/20

## 2020-02-24 NOTE — Chronic Care Management (AMB) (Signed)
Chronic Care Management    Clinical Social Work Follow Up Note  02/24/2020 Name: Andrea Holden MRN: [id number] DOB: 1946-01-10  Andrea Holden is a 74 y.o. year old female who is a primary care patient of [name], [place]. The CCM team was consulted for assistance with [name] .   Review of patient status, including review of consultants reports, other relevant assessments, and collaboration with appropriate care team members and the patient's provider was performed as part of comprehensive patient evaluation and provision of chronic care management services.    SDOH (Social Determinants of Health) assessments performed: No    Outpatient Encounter Medications as of 02/24/2020  Medication Sig Note  . acetaminophen (TYLENOL) 325 MG tablet Take 2 tablets (650 mg total) by mouth every 6 (six) hours as needed for mild pain, moderate pain, headache or fever.   Marland Kitchen albuterol (VENTOLIN HFA) 108 (90 Base) MCG/ACT inhaler Inhale 2 puffs into the lungs every 6 (six) hours as needed for wheezing or shortness of breath.   Marland Kitchen atorvastatin (LIPITOR) 10 MG tablet Take 1 tablet (10 mg total) by mouth at bedtime.   . Cholecalciferol (VITAMIN D3) 250 MCG (10000 UT) TABS Take 10,000 Units by mouth daily.    . clopidogrel (PLAVIX) 75 MG tablet Take 1 tablet (75 mg total) by mouth daily.   . fluticasone (FLONASE) 50 MCG/ACT nasal spray Place 2 sprays into both nostrils daily as needed. 01/06/2020: PRN allergies  . gabapentin (NEURONTIN) 400 MG capsule Take 1 capsule (400 mg total) by mouth 2 (two) times daily.   Marland Kitchen ipratropium-albuterol (DUONEB) 0.5-2.5 (3) MG/3ML SOLN Take 3 mLs by nebulization 3 (three) times daily as needed. (Patient taking differently: Take 3 mLs by nebulization 3 (three) times daily as needed (Shortness of breath). )   . Krill Oil 1000 MG CAPS Take 1,000 mg by mouth every other day.  (Patient not taking: Reported on 01/12/2020)   . lisinopril (ZESTRIL) 10 MG tablet Take 1  tablet (10 mg total) by mouth daily.   . montelukast (SINGULAIR) 10 MG tablet Take 1 tablet (10 mg total) by mouth at bedtime.   . Multiple Vitamins-Minerals (MULTIVITAMIN ADULTS 50+) TABS Take 1 tablet by mouth daily.   . pantoprazole (PROTONIX) 20 MG tablet Take 1 tablet (20 mg total) by mouth daily.   Marland Kitchen Respiratory Therapy Supplies (NEBULIZER/TUBING/MOUTHPIECE) KIT Disp one nebulizer machine, tubing set and mouthpiece kit   . sertraline (ZOLOFT) 100 MG tablet TAKE 1 AND 1/2 TABLETS (150 MG TOTAL) BY MOUTH DAILY (Patient taking differently: Take 100 mg by mouth daily. )   . terbinafine (LAMISIL) 250 MG tablet Take 1 tablet (250 mg total) by mouth daily.   Marland Kitchen umeclidinium-vilanterol (ANORO ELLIPTA) 62.5-25 MCG/INH AEPB Inhale 1 puff into the lungs daily.   . varenicline (CHANTIX CONTINUING MONTH PAK) 1 MG tablet Take 1 tablet (1 mg total) by mouth 2 (two) times daily. (Patient not taking: Reported on 01/12/2020)   . vitamin C (ASCORBIC ACID) 500 MG tablet Take 1,000 mg by mouth every other day.  (Patient not taking: Reported on 01/12/2020)   . zinc gluconate 50 MG tablet Take 50 mg by mouth daily.    No facility-administered encounter medications on file as of 02/24/2020.     Goals Addressed              This Visit's Progress   .  "I need help finding a new place to live": (pt-stated)  CARE PLAN ENTRY (see longitudinal plan of care for additional care plan information)  Current Barriers:  . Housing barriers  Clinical Social Work Clinical Goal(s):  Marland Kitchen Over the next 90 days, patient will work with the [facility] to address needs related to housing needs  Interventions: . Follow up phone call to patient regarding housing status . Per patient, she has a couple of friends that are assisting her in apartment search . Per patient, she has not found anything yet-needs a ground floor apartment with no stairs due to mobility issues and pets . Patient remains very emotional,  disappointed with the lack of assistance she is receiving from family and friends . Emotional support provided, patient continues to be reminded that obtaining housing is ultimately her responsibility and that this social worker will be available for resource assistance however would not be able to personally secure housing for her    Patient Self Care Activities:  . Performs ADL's independently . Calls provider office for new concerns or questions . Unable to perform IADLs independently  Please see past updates related to this goal by clicking on the "Past Updates" button in the selected goal          Follow Up Plan: SW will follow up with patient by phone over the next 2 weeks   [place], [place] Worker  [facility] Care Management [phone]

## 2020-02-25 ENCOUNTER — Ambulatory Visit: Payer: Self-pay | Admitting: *Deleted

## 2020-02-25 DIAGNOSIS — Z789 Other specified health status: Secondary | ICD-10-CM

## 2020-02-25 DIAGNOSIS — F3341 Major depressive disorder, recurrent, in partial remission: Secondary | ICD-10-CM | POA: Diagnosis not present

## 2020-02-25 DIAGNOSIS — Z599 Problem related to housing and economic circumstances, unspecified: Secondary | ICD-10-CM

## 2020-02-25 NOTE — Chronic Care Management (AMB) (Signed)
Chronic Care Management    Clinical Social Work Follow Up Note  02/25/2020 Name: Andrea Holden MRN: [id number] DOB: 12-28-1945  Andrea Holden is a 74 y.o. year old female who is a primary care patient of [name], [place]. The CCM team was consulted for assistance with [name] .   Review of patient status, including review of consultants reports, other relevant assessments, and collaboration with appropriate care team members and the patient's provider was performed as part of comprehensive patient evaluation and provision of chronic care management services.    SDOH (Social Determinants of Health) assessments performed: No    Outpatient Encounter Medications as of 02/25/2020  Medication Sig Note  . acetaminophen (TYLENOL) 325 MG tablet Take 2 tablets (650 mg total) by mouth every 6 (six) hours as needed for mild pain, moderate pain, headache or fever.   Marland Kitchen albuterol (VENTOLIN HFA) 108 (90 Base) MCG/ACT inhaler Inhale 2 puffs into the lungs every 6 (six) hours as needed for wheezing or shortness of breath.   Marland Kitchen atorvastatin (LIPITOR) 10 MG tablet Take 1 tablet (10 mg total) by mouth at bedtime.   . Cholecalciferol (VITAMIN D3) 250 MCG (10000 UT) TABS Take 10,000 Units by mouth daily.    . clopidogrel (PLAVIX) 75 MG tablet Take 1 tablet (75 mg total) by mouth daily.   . fluticasone (FLONASE) 50 MCG/ACT nasal spray Place 2 sprays into both nostrils daily as needed. 01/06/2020: PRN allergies  . gabapentin (NEURONTIN) 400 MG capsule Take 1 capsule (400 mg total) by mouth 2 (two) times daily.   Marland Kitchen ipratropium-albuterol (DUONEB) 0.5-2.5 (3) MG/3ML SOLN Take 3 mLs by nebulization 3 (three) times daily as needed. (Patient taking differently: Take 3 mLs by nebulization 3 (three) times daily as needed (Shortness of breath). )   . Krill Oil 1000 MG CAPS Take 1,000 mg by mouth every other day.  (Patient not taking: Reported on 01/12/2020)   . lisinopril (ZESTRIL) 10 MG tablet Take 1  tablet (10 mg total) by mouth daily.   . montelukast (SINGULAIR) 10 MG tablet Take 1 tablet (10 mg total) by mouth at bedtime.   . Multiple Vitamins-Minerals (MULTIVITAMIN ADULTS 50+) TABS Take 1 tablet by mouth daily.   . pantoprazole (PROTONIX) 20 MG tablet Take 1 tablet (20 mg total) by mouth daily.   Marland Kitchen Respiratory Therapy Supplies (NEBULIZER/TUBING/MOUTHPIECE) KIT Disp one nebulizer machine, tubing set and mouthpiece kit   . sertraline (ZOLOFT) 100 MG tablet TAKE 1 AND 1/2 TABLETS (150 MG TOTAL) BY MOUTH DAILY (Patient taking differently: Take 100 mg by mouth daily. )   . terbinafine (LAMISIL) 250 MG tablet Take 1 tablet (250 mg total) by mouth daily.   Marland Kitchen umeclidinium-vilanterol (ANORO ELLIPTA) 62.5-25 MCG/INH AEPB Inhale 1 puff into the lungs daily.   . varenicline (CHANTIX CONTINUING MONTH PAK) 1 MG tablet Take 1 tablet (1 mg total) by mouth 2 (two) times daily. (Patient not taking: Reported on 01/12/2020)   . vitamin C (ASCORBIC ACID) 500 MG tablet Take 1,000 mg by mouth every other day.  (Patient not taking: Reported on 01/12/2020)   . zinc gluconate 50 MG tablet Take 50 mg by mouth daily.    No facility-administered encounter medications on file as of 02/25/2020.     Goals Addressed              This Visit's Progress   .  "I need help finding a new place to live": (pt-stated)  CARE PLAN ENTRY (see longitudinal plan of care for additional care plan information)  Current Barriers:  . Housing barriers  Clinical Social Work Clinical Goal(s):  Marland Kitchen Over the next 90 days, patient will work with the [facility] to address needs related to housing needs  Interventions: Marland Kitchen Multiple phone calls to local apartments to check availability, [place]e.[place], [place], [place]. , [place]- . [employer] confirmed to have openings, however all availabilities are on the second floor-will provide patient with information for review   Patient Self Care Activities:   . Performs ADL's independently . Calls provider office for new concerns or questions . Unable to perform IADLs independently  Please see past updates related to this goal by clicking on the "Past Updates" button in the selected goal          Follow Up Plan: SW will follow up with patient by phone over the next 7-10 business days   Elliot Gurney, [place] Worker  [place] [facility] Care Management [phone]

## 2020-02-28 ENCOUNTER — Telehealth: Payer: Self-pay

## 2020-02-28 DIAGNOSIS — Z720 Tobacco use: Secondary | ICD-10-CM

## 2020-02-28 NOTE — Telephone Encounter (Signed)
Left message on voicemail at pharmacy

## 2020-02-28 NOTE — Telephone Encounter (Signed)
Copied from CRM [id number]. Topic: General - Other >> Feb 23, 2020  3:39 PM Dalphine Handing A wrote: Thayer Ohm with [facility] wants to know if Angelica Chessman could prescribe patient the generic form of Chantix and write prescription as though patient is taking the starter pack due to pharmacy having starter pack on back order for quite some time. Best contact is [phone] >> Feb 25, 2020  4:33 PM Limmie Patricia P wrote: chantix 1mg  directions for starter pak

## 2020-02-29 NOTE — Telephone Encounter (Signed)
Copied from CRM [id number]. Topic: General - Inquiry >> Feb 28, 2020  4:40 PM Adrian Prince D wrote: Reason for CRM: Patient states that it's urgent that she speaks to Dr. Angelica Chessman, the reason is personal. Please advise the patient.

## 2020-02-29 NOTE — Telephone Encounter (Signed)
Please send in this patients chantix.  Per pharmacy other is on back order he recommends 1mg  directions for starter pak.

## 2020-03-01 ENCOUNTER — Other Ambulatory Visit: Payer: Self-pay | Admitting: Cardiovascular Disease

## 2020-03-01 ENCOUNTER — Ambulatory Visit (INDEPENDENT_AMBULATORY_CARE_PROVIDER_SITE_OTHER): Payer: Medicare (Managed Care) | Admitting: *Deleted

## 2020-03-01 ENCOUNTER — Telehealth: Payer: Self-pay

## 2020-03-01 DIAGNOSIS — Z599 Problem related to housing and economic circumstances, unspecified: Secondary | ICD-10-CM

## 2020-03-01 DIAGNOSIS — Z789 Other specified health status: Secondary | ICD-10-CM

## 2020-03-01 DIAGNOSIS — F3341 Major depressive disorder, recurrent, in partial remission: Secondary | ICD-10-CM

## 2020-03-01 MED ORDER — VARENICLINE TARTRATE 1 MG PO TABS: ORAL_TABLET | ORAL | 0 refills | Status: DC

## 2020-03-01 NOTE — Telephone Encounter (Signed)
Pt called back reporting that this a "very high stress time and that she needs to urgently speak with PCP"   She declined offer to schedule appt because she states that she does not want to be charged. Made aware of Rx in pharmacy, states that this is a personal matter but will not disclose any further information.

## 2020-03-01 NOTE — Telephone Encounter (Signed)
Called patient back, she states was upset called Friday with personal concerns for Leisa to call her back. I notified patient that Sheliah Mends would be out for the next 2 weeks and that per our protocol phone call turn around time is 72hrs and that our physicians/providers usually do not call patients personally they have CMA's reach out.  I told her when she calls to try to state the reason for the call so it can be handled more appropriately.  I believe we thought it was regarding her chantix priscription that had previously been handle.  Pt states it is regarding her mental state and her wanting to apply for disability because a lot of people have been telling her she would qualify.  I asked patient regarding her mental health if she had ever seen psychologist?  She states yes but it has not worked for her.  I told her if she had any concerning symptoms such as suicidal thoughts to go to ER and she was also given information to [facility] the address and phone number to reach out to them.  As far as the disability I would check with [place] to see about what we need to do to try to that started.  I told patient it was a long process and lots of paperwork and lawyer that had to be involved, and even then just because people say  she needs it does not mean she would qualify.  An appointment was scheduled for leisa for first available in October to address mental health and other chronic conditions.

## 2020-03-01 NOTE — Telephone Encounter (Signed)
Hello, so I spoke with Andrea Holden today, which I know you did as well.  She is now wanting to try to apply for disability.  What steps are taken for her to get info regarding this? Who would she need to call? Social security?  I told her it was a long process with paperwork, lawyer and that even then she may not receive.  Thanks

## 2020-03-01 NOTE — Telephone Encounter (Signed)
Please schedule overdue 12 month F/U with Dr. Gollan. Thank you! 

## 2020-03-01 NOTE — Chronic Care Management (AMB) (Signed)
Chronic Care Management    Clinical Social Work Follow Up Note  03/01/2020 Name: Andrea Holden MRN: [id number] DOB: 08-24-1945  Andrea Holden is a 74 y.o. year old female who is a primary care patient of [name], [place]. The CCM team was consulted for assistance with [name] .   Review of patient status, including review of consultants reports, other relevant assessments, and collaboration with appropriate care team members and the patient's provider was performed as part of comprehensive patient evaluation and provision of chronic care management services.    SDOH (Social Determinants of Health) assessments performed: No    Outpatient Encounter Medications as of 03/01/2020  Medication Sig Note  . acetaminophen (TYLENOL) 325 MG tablet Take 2 tablets (650 mg total) by mouth every 6 (six) hours as needed for mild pain, moderate pain, headache or fever.   Marland Kitchen albuterol (VENTOLIN HFA) 108 (90 Base) MCG/ACT inhaler Inhale 2 puffs into the lungs every 6 (six) hours as needed for wheezing or shortness of breath.   Marland Kitchen atorvastatin (LIPITOR) 10 MG tablet Take 1 tablet (10 mg total) by mouth at bedtime.   . Cholecalciferol (VITAMIN D3) 250 MCG (10000 UT) TABS Take 10,000 Units by mouth daily.    . clopidogrel (PLAVIX) 75 MG tablet TAKE 1 TABLET BY MOUTH ONCE A DAY   . fluticasone (FLONASE) 50 MCG/ACT nasal spray Place 2 sprays into both nostrils daily as needed. 01/06/2020: PRN allergies  . gabapentin (NEURONTIN) 400 MG capsule Take 1 capsule (400 mg total) by mouth 2 (two) times daily.   Marland Kitchen ipratropium-albuterol (DUONEB) 0.5-2.5 (3) MG/3ML SOLN Take 3 mLs by nebulization 3 (three) times daily as needed. (Patient taking differently: Take 3 mLs by nebulization 3 (three) times daily as needed (Shortness of breath). )   . Krill Oil 1000 MG CAPS Take 1,000 mg by mouth every other day.  (Patient not taking: Reported on 01/12/2020)   . lisinopril (ZESTRIL) 10 MG tablet Take 1 tablet (10 mg  total) by mouth daily.   . montelukast (SINGULAIR) 10 MG tablet Take 1 tablet (10 mg total) by mouth at bedtime.   . Multiple Vitamins-Minerals (MULTIVITAMIN ADULTS 50+) TABS Take 1 tablet by mouth daily.   . pantoprazole (PROTONIX) 20 MG tablet Take 1 tablet (20 mg total) by mouth daily.   Marland Kitchen Respiratory Therapy Supplies (NEBULIZER/TUBING/MOUTHPIECE) KIT Disp one nebulizer machine, tubing set and mouthpiece kit   . sertraline (ZOLOFT) 100 MG tablet TAKE 1 AND 1/2 TABLETS (150 MG TOTAL) BY MOUTH DAILY (Patient taking differently: Take 100 mg by mouth daily. )   . terbinafine (LAMISIL) 250 MG tablet Take 1 tablet (250 mg total) by mouth daily.   Marland Kitchen umeclidinium-vilanterol (ANORO ELLIPTA) 62.5-25 MCG/INH AEPB Inhale 1 puff into the lungs daily.   . varenicline (CHANTIX) 1 MG tablet Take 0.5 mg tablet by mouth 1x daily for 3 days, then increase to 0.5 mg tablet 2x daily for 4 days, then increase to 1 mg tablet 2x daily.   . vitamin C (ASCORBIC ACID) 500 MG tablet Take 1,000 mg by mouth every other day.  (Patient not taking: Reported on 01/12/2020)   . zinc gluconate 50 MG tablet Take 50 mg by mouth daily.    No facility-administered encounter medications on file as of 03/01/2020.     Goals Addressed              This Visit's Progress   .  "I need help finding a new place to live": (  pt-stated)        CARE PLAN ENTRY (see longitudinal plan of care for additional care plan information)  Current Barriers:  . Housing barriers  Clinical Social Work Clinical Goal(s):  Marland Kitchen Over the next 90 days, patient will work with the [facility] to address needs related to housing needs  Interventions: . Patient continues to make efforts to identify housing  . Patient discussed involving friends to assist her-positive reinforcement provided for requesting and allowing friends to assist her . Contact number provided for [employer] [phone] and [employer] [phone] for possible  rentals . Patient in agreement to contact the above resources, but continues to discuss feeling discouraged with the process . Continued encouragement provided to continue the search and allow assistance from friends in the community   Patient Self Care Activities:  . Performs ADL's independently . Calls provider office for new concerns or questions . Unable to perform IADLs independently  Please see past updates related to this goal by clicking on the "Past Updates" button in the selected goal          Follow Up Plan: SW will follow up with patient by phone over the next 7-14 business days   [place], [place] Worker  [place] [facility] Care Management [phone]

## 2020-03-01 NOTE — Patient Instructions (Signed)
Thank you allowing the Chronic Care Management Team to be a part of your care! It was a pleasure speaking with you today!  1. Please follow up on housing resources provided  CCM (Chronic Care Management) Team   [name] RN, BSN Nurse Care Coordinator  [phone]  Lakyla Biswas [address] LCSW Clinical Social Worker [phone]  Goals Addressed              This Visit's Progress   .  "I need help finding a new place to live": (pt-stated)        CARE PLAN ENTRY (see longitudinal plan of care for additional care plan information)  Current Barriers:  . Housing barriers  Clinical Social Work Clinical Goal(s):  Marland Kitchen Over the next 90 days, patient will work with the [facility] to address needs related to housing needs  Interventions: . Patient continues to make efforts to identify housing  . Patient discussed involving friends to assist her-positive reinforcement provided for requesting and allowing friends to assist her . Contact number provided for [employer] [phone] and [employer] [phone] for possible rentals . Patient in agreement to contact the above resources, but continues to discuss feeling discouraged with the process . Continued encouragement provided to continue the search and allow assistance from friends in the community   Patient Self Care Activities:  . Performs ADL's independently . Calls provider office for new concerns or questions . Unable to perform IADLs independently  Please see past updates related to this goal by clicking on the "Past Updates" button in the selected goal          The patient verbalized understanding of instructions provided today and declined a print copy of patient instruction materials.   Telephone follow up appointment with care management team member scheduled for:9//9/21

## 2020-03-01 NOTE — Telephone Encounter (Signed)
Scheduled

## 2020-03-03 ENCOUNTER — Ambulatory Visit: Payer: Self-pay | Admitting: *Deleted

## 2020-03-03 DIAGNOSIS — Z789 Other specified health status: Secondary | ICD-10-CM

## 2020-03-03 DIAGNOSIS — Z599 Problem related to housing and economic circumstances, unspecified: Secondary | ICD-10-CM

## 2020-03-03 DIAGNOSIS — F3341 Major depressive disorder, recurrent, in partial remission: Secondary | ICD-10-CM

## 2020-03-03 NOTE — Patient Instructions (Signed)
Thank you allowing the Chronic Care Management Team to be a part of your care! It was a pleasure speaking with you today!  1. Please complete housing application and submit as soon as possible  CCM (Chronic Care Management) Team   [name] RN, BSN Nurse Care Coordinator  [phone]  Sheikh Leverich [address] LCSW Clinical Social Worker [phone]  Goals Addressed              This Visit's Progress   .  "I need help finding a new place to live": (pt-stated)        CARE PLAN ENTRY (see longitudinal plan of care for additional care plan information)  Current Barriers:  . Housing barriers  Clinical Social Work Clinical Goal(s):  Marland Kitchen Over the next 90 days, patient will work with the [facility] to address needs related to housing needs  Interventions: . Patient continues to make efforts to identify housing  . Patient discussed involving friends to assist her-positive reinforcement provided for requesting and allowing friends to assist her . Patient has been in contact with the [facility] and has confirmed that they have availability . Patient is having a friend come by today to assist with the application-application can only be submitted by appointment on Tuesdays and Fridays . Continued encouragement provided to continue the search and allow assistance from friends in the community   Patient Self Care Activities:  . Performs ADL's independently . Calls provider office for new concerns or questions . Unable to perform IADLs independently  Please see past updates related to this goal by clicking on the "Past Updates" button in the selected goal          The patient verbalized understanding of instructions provided today and declined a print copy of patient instruction materials.   Telephone follow up appointment with care management team member scheduled for: 03/09/20

## 2020-03-03 NOTE — Chronic Care Management (AMB) (Signed)
Chronic Care Management    Clinical Social Work Follow Up Note  03/03/2020 Name: Andrea Holden MRN: [id number] DOB: 11-26-1945  Andrea Holden is a 74 y.o. year old female who is a primary care patient of [name], [place]. The CCM team was consulted for assistance with [name] .   Review of patient status, including review of consultants reports, other relevant assessments, and collaboration with appropriate care team members and the patient's provider was performed as part of comprehensive patient evaluation and provision of chronic care management services.    SDOH (Social Determinants of Health) assessments performed: No    Outpatient Encounter Medications as of 03/03/2020  Medication Sig Note  . acetaminophen (TYLENOL) 325 MG tablet Take 2 tablets (650 mg total) by mouth every 6 (six) hours as needed for mild pain, moderate pain, headache or fever.   Marland Kitchen albuterol (VENTOLIN HFA) 108 (90 Base) MCG/ACT inhaler Inhale 2 puffs into the lungs every 6 (six) hours as needed for wheezing or shortness of breath.   Marland Kitchen atorvastatin (LIPITOR) 10 MG tablet Take 1 tablet (10 mg total) by mouth at bedtime.   . Cholecalciferol (VITAMIN D3) 250 MCG (10000 UT) TABS Take 10,000 Units by mouth daily.    . clopidogrel (PLAVIX) 75 MG tablet TAKE 1 TABLET BY MOUTH ONCE A DAY   . fluticasone (FLONASE) 50 MCG/ACT nasal spray Place 2 sprays into both nostrils daily as needed. 01/06/2020: PRN allergies  . gabapentin (NEURONTIN) 400 MG capsule Take 1 capsule (400 mg total) by mouth 2 (two) times daily.   Marland Kitchen ipratropium-albuterol (DUONEB) 0.5-2.5 (3) MG/3ML SOLN Take 3 mLs by nebulization 3 (three) times daily as needed. (Patient taking differently: Take 3 mLs by nebulization 3 (three) times daily as needed (Shortness of breath). )   . Krill Oil 1000 MG CAPS Take 1,000 mg by mouth every other day.  (Patient not taking: Reported on 01/12/2020)   . lisinopril (ZESTRIL) 10 MG tablet Take 1 tablet (10 mg  total) by mouth daily.   . montelukast (SINGULAIR) 10 MG tablet Take 1 tablet (10 mg total) by mouth at bedtime.   . Multiple Vitamins-Minerals (MULTIVITAMIN ADULTS 50+) TABS Take 1 tablet by mouth daily.   . pantoprazole (PROTONIX) 20 MG tablet Take 1 tablet (20 mg total) by mouth daily.   Marland Kitchen Respiratory Therapy Supplies (NEBULIZER/TUBING/MOUTHPIECE) KIT Disp one nebulizer machine, tubing set and mouthpiece kit   . sertraline (ZOLOFT) 100 MG tablet TAKE 1 AND 1/2 TABLETS (150 MG TOTAL) BY MOUTH DAILY (Patient taking differently: Take 100 mg by mouth daily. )   . terbinafine (LAMISIL) 250 MG tablet Take 1 tablet (250 mg total) by mouth daily.   Marland Kitchen umeclidinium-vilanterol (ANORO ELLIPTA) 62.5-25 MCG/INH AEPB Inhale 1 puff into the lungs daily.   . varenicline (CHANTIX) 1 MG tablet Take 0.5 mg tablet by mouth 1x daily for 3 days, then increase to 0.5 mg tablet 2x daily for 4 days, then increase to 1 mg tablet 2x daily.   . vitamin C (ASCORBIC ACID) 500 MG tablet Take 1,000 mg by mouth every other day.  (Patient not taking: Reported on 01/12/2020)   . zinc gluconate 50 MG tablet Take 50 mg by mouth daily.    No facility-administered encounter medications on file as of 03/03/2020.     Goals Addressed              This Visit's Progress   .  "I need help finding a new place to live": (  pt-stated)        CARE PLAN ENTRY (see longitudinal plan of care for additional care plan information)  Current Barriers:  . Housing barriers  Clinical Social Work Clinical Goal(s):  Marland Kitchen Over the next 90 days, patient will work with the [facility] to address needs related to housing needs  Interventions: . Patient continues to make efforts to identify housing  . Patient discussed involving friends to assist her-positive reinforcement provided for requesting and allowing friends to assist her . Patient has been in contact with the [facility] and has confirmed that they have  availability . Patient is having a friend come by today to assist with the application-application can only be submitted by appointment on Tuesdays and Fridays . Continued encouragement provided to continue the search and allow assistance from friends in the community   Patient Self Care Activities:  . Performs ADL's independently . Calls provider office for new concerns or questions . Unable to perform IADLs independently  Please see past updates related to this goal by clicking on the "Past Updates" button in the selected goal          Follow Up Plan: SW will follow up with patient by phone over the next 7-14 business days   [place], [place] Worker  [place] [facility] Care Management [phone]

## 2020-03-09 ENCOUNTER — Telehealth: Payer: Self-pay

## 2020-03-10 ENCOUNTER — Ambulatory Visit: Payer: Self-pay | Admitting: *Deleted

## 2020-03-10 DIAGNOSIS — F3341 Major depressive disorder, recurrent, in partial remission: Secondary | ICD-10-CM

## 2020-03-10 DIAGNOSIS — Z789 Other specified health status: Secondary | ICD-10-CM

## 2020-03-10 DIAGNOSIS — Z599 Problem related to housing and economic circumstances, unspecified: Secondary | ICD-10-CM

## 2020-03-10 NOTE — Patient Instructions (Signed)
Thank you allowing the Chronic Care Management Team to be a part of your care! It was a pleasure speaking with you today!  1. Please continue to follow up with all housing resources to identify housing  CCM (Chronic Care Management) Team   [name] RN, BSN Nurse Care Coordinator  [phone]  Randol Zumstein, LCSW Clinical Social Worker [phone]  Goals Addressed              This Visit's Progress     "I need help finding a new place to live": (pt-stated)        CARE PLAN ENTRY (see longitudinal plan of care for additional care plan information)  Current Barriers:   Housing barriers  Clinical Social Work Clinical Goal(s):   Over the next 90 days, patient will work with the [facility] to address needs related to housing needs  Interventions:  Patient continues to make efforts to identify housing   Patient has plans today for friends to help her pack her things to put in sotrage  Patient has been in contact with the [employer] and has confirmed that they have availability, however she needs her original birth certificate  Plan is to meet patient in the office on 9/13 to assist with ordering her birth certificate online  Continued encouragement provided to continue the search and allow assistance from friends in the community   Patient Self Care Activities:   Performs ADL's independently  Calls provider office for new concerns or questions  Unable to perform IADLs independently  Please see past updates related to this goal by clicking on the "Past Updates" button in the selected goal          The patient verbalized understanding of instructions provided today and declined a print copy of patient instruction materials.   Face to Face appointment with care management team member scheduled for: 03/13/20

## 2020-03-10 NOTE — Chronic Care Management (AMB) (Signed)
Chronic Care Management    Clinical Social Work Follow Up Note  03/10/2020 Name: Andrea Holden MRN: [id number] DOB: 1945/10/30  Andrea Holden is a 74 y.o. year old female who is a primary care patient of [name], [place]. The CCM team was consulted for assistance with [name] .   Review of patient status, including review of consultants reports, other relevant assessments, and collaboration with appropriate care team members and the patient's provider was performed as part of comprehensive patient evaluation and provision of chronic care management services.    SDOH (Social Determinants of Health) assessments performed: No    Outpatient Encounter Medications as of 03/10/2020  Medication Sig Note  . acetaminophen (TYLENOL) 325 MG tablet Take 2 tablets (650 mg total) by mouth every 6 (six) hours as needed for mild pain, moderate pain, headache or fever.   Marland Kitchen albuterol (VENTOLIN HFA) 108 (90 Base) MCG/ACT inhaler Inhale 2 puffs into the lungs every 6 (six) hours as needed for wheezing or shortness of breath.   Marland Kitchen atorvastatin (LIPITOR) 10 MG tablet Take 1 tablet (10 mg total) by mouth at bedtime.   . Cholecalciferol (VITAMIN D3) 250 MCG (10000 UT) TABS Take 10,000 Units by mouth daily.    . clopidogrel (PLAVIX) 75 MG tablet TAKE 1 TABLET BY MOUTH ONCE A DAY   . fluticasone (FLONASE) 50 MCG/ACT nasal spray Place 2 sprays into both nostrils daily as needed. 01/06/2020: PRN allergies  . gabapentin (NEURONTIN) 400 MG capsule Take 1 capsule (400 mg total) by mouth 2 (two) times daily.   Marland Kitchen ipratropium-albuterol (DUONEB) 0.5-2.5 (3) MG/3ML SOLN Take 3 mLs by nebulization 3 (three) times daily as needed. (Patient taking differently: Take 3 mLs by nebulization 3 (three) times daily as needed (Shortness of breath). )   . Krill Oil 1000 MG CAPS Take 1,000 mg by mouth every other day.  (Patient not taking: Reported on 01/12/2020)   . lisinopril (ZESTRIL) 10 MG tablet Take 1 tablet (10 mg  total) by mouth daily.   . montelukast (SINGULAIR) 10 MG tablet Take 1 tablet (10 mg total) by mouth at bedtime.   . Multiple Vitamins-Minerals (MULTIVITAMIN ADULTS 50+) TABS Take 1 tablet by mouth daily.   . pantoprazole (PROTONIX) 20 MG tablet Take 1 tablet (20 mg total) by mouth daily.   Marland Kitchen Respiratory Therapy Supplies (NEBULIZER/TUBING/MOUTHPIECE) KIT Disp one nebulizer machine, tubing set and mouthpiece kit   . sertraline (ZOLOFT) 100 MG tablet TAKE 1 AND 1/2 TABLETS (150 MG TOTAL) BY MOUTH DAILY (Patient taking differently: Take 100 mg by mouth daily. )   . terbinafine (LAMISIL) 250 MG tablet Take 1 tablet (250 mg total) by mouth daily.   Marland Kitchen umeclidinium-vilanterol (ANORO ELLIPTA) 62.5-25 MCG/INH AEPB Inhale 1 puff into the lungs daily.   . varenicline (CHANTIX) 1 MG tablet Take 0.5 mg tablet by mouth 1x daily for 3 days, then increase to 0.5 mg tablet 2x daily for 4 days, then increase to 1 mg tablet 2x daily.   . vitamin C (ASCORBIC ACID) 500 MG tablet Take 1,000 mg by mouth every other day.  (Patient not taking: Reported on 01/12/2020)   . zinc gluconate 50 MG tablet Take 50 mg by mouth daily.    No facility-administered encounter medications on file as of 03/10/2020.     Goals Addressed              This Visit's Progress   .  "I need help finding a new place to live": (  pt-stated)        CARE PLAN ENTRY (see longitudinal plan of care for additional care plan information)  Current Barriers:  . Housing barriers  Clinical Social Work Clinical Goal(s):  Marland Kitchen Over the next 90 days, patient will work with the [facility] to address needs related to housing needs  Interventions: . Patient continues to make efforts to identify housing  . Patient has plans today for friends to help her pack her things to put in sotrage . Patient has been in contact with the [employer] and has confirmed that they have availability, however she needs her original birth certificate . Plan  is to meet patient in the office on 9/13 to assist with ordering her birth certificate online . Continued encouragement provided to continue the search and allow assistance from friends in the community   Patient Self Care Activities:  . Performs ADL's independently . Calls provider office for new concerns or questions . Unable to perform IADLs independently  Please see past updates related to this goal by clicking on the "Past Updates" button in the selected goal          Follow Up Plan: Appointment scheduled for SW to meet with client in provider office on: 03/13/20   Elliot Gurney, [place] Worker  [facility] Care Management [phone]

## 2020-03-13 ENCOUNTER — Other Ambulatory Visit: Payer: Self-pay

## 2020-03-13 ENCOUNTER — Ambulatory Visit: Payer: Medicare (Managed Care) | Admitting: *Deleted

## 2020-03-13 DIAGNOSIS — Z599 Problem related to housing and economic circumstances, unspecified: Secondary | ICD-10-CM

## 2020-03-13 DIAGNOSIS — F3341 Major depressive disorder, recurrent, in partial remission: Secondary | ICD-10-CM

## 2020-03-13 DIAGNOSIS — Z789 Other specified health status: Secondary | ICD-10-CM

## 2020-03-14 ENCOUNTER — Ambulatory Visit: Payer: Self-pay | Admitting: *Deleted

## 2020-03-14 DIAGNOSIS — F3341 Major depressive disorder, recurrent, in partial remission: Secondary | ICD-10-CM

## 2020-03-14 DIAGNOSIS — Z789 Other specified health status: Secondary | ICD-10-CM

## 2020-03-14 DIAGNOSIS — Z599 Problem related to housing and economic circumstances, unspecified: Secondary | ICD-10-CM

## 2020-03-14 NOTE — Patient Instructions (Signed)
Thank you allowing the Chronic Care Management Team to be a part of your care! It was a pleasure speaking with you today!  1. Please pick up and complete the application for [facility]  CCM (Chronic Care Management) Team   [name] RN, BSN Nurse Care Coordinator  [phone]  [place], LCSW Clinical Social Worker [phone]  Goals Addressed              This Visit's Progress   .  "I need help finding a new place to live": (pt-stated)        CARE PLAN ENTRY (see longitudinal plan of care for additional care plan information)  Current Barriers:  . Housing barriers  Clinical Social Work Clinical Goal(s):  Marland Kitchen Over the next 90 days, patient will work with the [facility] to address needs related to housing needs  Interventions: . Patient continues to make efforts to identify housing  . Patient's friends continue to help her pack her things for storage, however no housing has been identified . Patient has been in contact with the [facility] and has confirmed that they had availability, however there are no availabilities at this time. Marland Kitchen Phone call to [facility] to confirm that the had availability, however patient needs her original birth certificate . This social worker assisted patient in ordering her birth certificate online today . Continued encouragement provided to continue the search for an apartment emphasizing the need to manage her expectations and to allow assistance from friends in the community   Patient Self Care Activities:  . Performs ADL's independently . Calls provider office for new concerns or questions . Unable to perform IADLs independently  Please see past updates related to this goal by clicking on the "Past Updates" button in the selected goal          The patient verbalized understanding of instructions provided today and declined a print copy of patient instruction materials.   Telephone follow  up appointment with care management team member scheduled for: 03/20/20

## 2020-03-14 NOTE — Chronic Care Management (AMB) (Signed)
Chronic Care Management    Clinical Social Work Follow Up Note  03/14/2020 Name: Andrea Holden MRN: [id number] DOB: 11-20-1945  Andrea Holden is a 74 y.o. year old female who is a primary care patient of [name], [place]. The CCM team was consulted for assistance with [name] .   Review of patient status, including review of consultants reports, other relevant assessments, and collaboration with appropriate care team members and the patient's provider was performed as part of comprehensive patient evaluation and provision of chronic care management services.    SDOH (Social Determinants of Health) assessments performed: No    Outpatient Encounter Medications as of 03/14/2020  Medication Sig Note  . acetaminophen (TYLENOL) 325 MG tablet Take 2 tablets (650 mg total) by mouth every 6 (six) hours as needed for mild pain, moderate pain, headache or fever.   Marland Kitchen albuterol (VENTOLIN HFA) 108 (90 Base) MCG/ACT inhaler Inhale 2 puffs into the lungs every 6 (six) hours as needed for wheezing or shortness of breath.   Marland Kitchen atorvastatin (LIPITOR) 10 MG tablet Take 1 tablet (10 mg total) by mouth at bedtime.   . Cholecalciferol (VITAMIN D3) 250 MCG (10000 UT) TABS Take 10,000 Units by mouth daily.    . clopidogrel (PLAVIX) 75 MG tablet TAKE 1 TABLET BY MOUTH ONCE A DAY   . fluticasone (FLONASE) 50 MCG/ACT nasal spray Place 2 sprays into both nostrils daily as needed. 01/06/2020: PRN allergies  . gabapentin (NEURONTIN) 400 MG capsule Take 1 capsule (400 mg total) by mouth 2 (two) times daily.   Marland Kitchen ipratropium-albuterol (DUONEB) 0.5-2.5 (3) MG/3ML SOLN Take 3 mLs by nebulization 3 (three) times daily as needed. (Patient taking differently: Take 3 mLs by nebulization 3 (three) times daily as needed (Shortness of breath). )   . Krill Oil 1000 MG CAPS Take 1,000 mg by mouth every other day.  (Patient not taking: Reported on 01/12/2020)   . lisinopril (ZESTRIL) 10 MG tablet Take 1 tablet (10 mg  total) by mouth daily.   . montelukast (SINGULAIR) 10 MG tablet Take 1 tablet (10 mg total) by mouth at bedtime.   . Multiple Vitamins-Minerals (MULTIVITAMIN ADULTS 50+) TABS Take 1 tablet by mouth daily.   . pantoprazole (PROTONIX) 20 MG tablet Take 1 tablet (20 mg total) by mouth daily.   Marland Kitchen Respiratory Therapy Supplies (NEBULIZER/TUBING/MOUTHPIECE) KIT Disp one nebulizer machine, tubing set and mouthpiece kit   . sertraline (ZOLOFT) 100 MG tablet TAKE 1 AND 1/2 TABLETS (150 MG TOTAL) BY MOUTH DAILY (Patient taking differently: Take 100 mg by mouth daily. )   . terbinafine (LAMISIL) 250 MG tablet Take 1 tablet (250 mg total) by mouth daily.   Marland Kitchen umeclidinium-vilanterol (ANORO ELLIPTA) 62.5-25 MCG/INH AEPB Inhale 1 puff into the lungs daily.   . varenicline (CHANTIX) 1 MG tablet Take 0.5 mg tablet by mouth 1x daily for 3 days, then increase to 0.5 mg tablet 2x daily for 4 days, then increase to 1 mg tablet 2x daily.   . vitamin C (ASCORBIC ACID) 500 MG tablet Take 1,000 mg by mouth every other day.  (Patient not taking: Reported on 01/12/2020)   . zinc gluconate 50 MG tablet Take 50 mg by mouth daily.    No facility-administered encounter medications on file as of 03/14/2020.     Goals Addressed              This Visit's Progress   .  "I need help finding a new place to live": (  pt-stated)        CARE PLAN ENTRY (see longitudinal plan of care for additional care plan information)  Current Barriers:  . Housing barriers  Clinical Social Work Clinical Goal(s):  Marland Kitchen Over the next 90 days, patient will work with the [facility] to address needs related to housing needs  Interventions: . Patient continues to make efforts to identify housing  . Patient's friends continue to help her pack her things for storage, however no housing has been identified . Patient now having trouble with her car insurance, due to the fact that she was driving during the time that her insurance had lapsed, her  insurance is now revoked and under penalty-patient will have to go before a judge to dispute this . 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 Continued encouragement provided to continue the search for an apartment emphasizing the need to manage her expectations and to allow assistance from friends in the community   Patient Self Care Activities:  . Performs ADL's independently . Calls provider office for new concerns or questions . Unable to perform IADLs independently  Please see past updates related to this goal by clicking on the "Past Updates" button in the selected goal          Follow Up Plan: SW will follow up with patient by phone over the next 7-10 business days   Elliot Gurney, [place] Worker  [facility] Care Management [phone]

## 2020-03-14 NOTE — Chronic Care Management (AMB) (Signed)
Chronic Care Management    Clinical Social Work Follow Up Note  03/14/2020 Name: Andrea Holden MRN: [id number] DOB: March 25, 1946  Andrea Holden is a 74 y.o. year old female who is a primary care patient of [name], [place]. The CCM team was consulted for assistance with [name] .   Review of patient status, including review of consultants reports, other relevant assessments, and collaboration with appropriate care team members and the patient's provider was performed as part of comprehensive patient evaluation and provision of chronic care management services.    SDOH (Social Determinants of Health) assessments performed: No    Outpatient Encounter Medications as of 03/13/2020  Medication Sig Note  . acetaminophen (TYLENOL) 325 MG tablet Take 2 tablets (650 mg total) by mouth every 6 (six) hours as needed for mild pain, moderate pain, headache or fever.   Marland Kitchen albuterol (VENTOLIN HFA) 108 (90 Base) MCG/ACT inhaler Inhale 2 puffs into the lungs every 6 (six) hours as needed for wheezing or shortness of breath.   Marland Kitchen atorvastatin (LIPITOR) 10 MG tablet Take 1 tablet (10 mg total) by mouth at bedtime.   . Cholecalciferol (VITAMIN D3) 250 MCG (10000 UT) TABS Take 10,000 Units by mouth daily.    . clopidogrel (PLAVIX) 75 MG tablet TAKE 1 TABLET BY MOUTH ONCE A DAY   . fluticasone (FLONASE) 50 MCG/ACT nasal spray Place 2 sprays into both nostrils daily as needed. 01/06/2020: PRN allergies  . gabapentin (NEURONTIN) 400 MG capsule Take 1 capsule (400 mg total) by mouth 2 (two) times daily.   Marland Kitchen ipratropium-albuterol (DUONEB) 0.5-2.5 (3) MG/3ML SOLN Take 3 mLs by nebulization 3 (three) times daily as needed. (Patient taking differently: Take 3 mLs by nebulization 3 (three) times daily as needed (Shortness of breath). )   . Krill Oil 1000 MG CAPS Take 1,000 mg by mouth every other day.  (Patient not taking: Reported on 01/12/2020)   . lisinopril (ZESTRIL) 10 MG tablet Take 1 tablet (10 mg  total) by mouth daily.   . montelukast (SINGULAIR) 10 MG tablet Take 1 tablet (10 mg total) by mouth at bedtime.   . Multiple Vitamins-Minerals (MULTIVITAMIN ADULTS 50+) TABS Take 1 tablet by mouth daily.   . pantoprazole (PROTONIX) 20 MG tablet Take 1 tablet (20 mg total) by mouth daily.   Marland Kitchen Respiratory Therapy Supplies (NEBULIZER/TUBING/MOUTHPIECE) KIT Disp one nebulizer machine, tubing set and mouthpiece kit   . sertraline (ZOLOFT) 100 MG tablet TAKE 1 AND 1/2 TABLETS (150 MG TOTAL) BY MOUTH DAILY (Patient taking differently: Take 100 mg by mouth daily. )   . terbinafine (LAMISIL) 250 MG tablet Take 1 tablet (250 mg total) by mouth daily.   Marland Kitchen umeclidinium-vilanterol (ANORO ELLIPTA) 62.5-25 MCG/INH AEPB Inhale 1 puff into the lungs daily.   . varenicline (CHANTIX) 1 MG tablet Take 0.5 mg tablet by mouth 1x daily for 3 days, then increase to 0.5 mg tablet 2x daily for 4 days, then increase to 1 mg tablet 2x daily.   . vitamin C (ASCORBIC ACID) 500 MG tablet Take 1,000 mg by mouth every other day.  (Patient not taking: Reported on 01/12/2020)   . zinc gluconate 50 MG tablet Take 50 mg by mouth daily.    No facility-administered encounter medications on file as of 03/13/2020.     Goals Addressed              This Visit's Progress   .  "I need help finding a new place to live": (  pt-stated)        CARE PLAN ENTRY (see longitudinal plan of care for additional care plan information)  Current Barriers:  . Housing barriers  Clinical Social Work Clinical Goal(s):  Marland Kitchen Over the next 90 days, patient will work with the [facility] to address needs related to housing needs  Interventions: . Patient continues to make efforts to identify housing  . Patient's friends continue to help her pack her things for storage, however no housing has been identified . Patient has been in contact with the [facility] and has confirmed that they had availability, however there are no  availabilities at this time. Marland Kitchen Phone call to [facility] to confirm that the had availability, however patient needs her original birth certificate . This social worker assisted patient in ordering her birth certificate online today . Patient agrees to go to [employer] to pick up the application for [employer] to complete and return . Continued encouragement provided to continue the search for an apartment emphasizing the need to manage her expectations and to allow assistance from friends in the community   Patient Self Care Activities:  . Performs ADL's independently . Calls provider office for new concerns or questions . Unable to perform IADLs independently  Please see past updates related to this goal by clicking on the "Past Updates" button in the selected goal          Follow Up Plan: SW will follow up with patient by phone over the next 7-10 business days   [place], [place] Worker  [facility] Care Management [phone] \

## 2020-03-14 NOTE — Patient Instructions (Signed)
Thank you allowing the Chronic Care Management Team to be a part of your care! It was a pleasure speaking with you today!  1. Please complete the application for [facility] and submit when complete  CCM (Chronic Care Management) Team   [name] RN, BSN Nurse Care Coordinator  [phone]  Sherika Kubicki [address] LCSW Clinical Social Worker [phone]  Goals Addressed              This Visit's Progress   .  "I need help finding a new place to live": (pt-stated)        CARE PLAN ENTRY (see longitudinal plan of care for additional care plan information)  Current Barriers:  . Housing barriers  Clinical Social Work Clinical Goal(s):  Marland Kitchen Over the next 90 days, patient will work with the [facility] to address needs related to housing needs  Interventions: . Patient continues to make efforts to identify housing  . Patient's friends continue to help her pack her things for storage, however no housing has been identified . Patient now having trouble with her car insurance, due to the fact that she was driving during the time that her insurance had lapsed, her insurance is now revoked and under penalty-patient will have to go before a judge to dispute this . 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 Continued encouragement provided to continue the search for an apartment emphasizing the need to manage her expectations and to allow assistance from friends in the community   Patient Self Care Activities:  . Performs ADL's independently . Calls provider office for new concerns or questions . Unable to perform IADLs independently  Please see past updates related to this goal by clicking on the "Past Updates" button in the selected goal          The patient verbalized understanding of instructions provided today  and declined a print copy of patient instruction materials.   Telephone follow up appointment with care management team member scheduled for: 03/20/20

## 2020-03-20 ENCOUNTER — Telehealth: Payer: Self-pay

## 2020-03-21 ENCOUNTER — Ambulatory Visit: Payer: Self-pay | Admitting: *Deleted

## 2020-03-21 DIAGNOSIS — Z789 Other specified health status: Secondary | ICD-10-CM

## 2020-03-21 DIAGNOSIS — F3341 Major depressive disorder, recurrent, in partial remission: Secondary | ICD-10-CM

## 2020-03-21 DIAGNOSIS — Z599 Problem related to housing and economic circumstances, unspecified: Secondary | ICD-10-CM

## 2020-03-21 NOTE — Chronic Care Management (AMB) (Signed)
Chronic Care Management    Clinical Social Work Follow Up Note  03/21/2020 Name: Andrea Holden MRN: [id number] DOB: 07/20/1945  Andrea Holden is a 74 y.o. year old female who is a primary care patient of [name], [place]. The CCM team was consulted for assistance with [name] .   Review of patient status, including review of consultants reports, other relevant assessments, and collaboration with appropriate care team members and the patient's provider was performed as part of comprehensive patient evaluation and provision of chronic care management services.    SDOH (Social Determinants of Health) assessments performed: No    Outpatient Encounter Medications as of 03/21/2020  Medication Sig Note  . acetaminophen (TYLENOL) 325 MG tablet Take 2 tablets (650 mg total) by mouth every 6 (six) hours as needed for mild pain, moderate pain, headache or fever.   Marland Kitchen albuterol (VENTOLIN HFA) 108 (90 Base) MCG/ACT inhaler Inhale 2 puffs into the lungs every 6 (six) hours as needed for wheezing or shortness of breath.   Marland Kitchen atorvastatin (LIPITOR) 10 MG tablet Take 1 tablet (10 mg total) by mouth at bedtime.   . Cholecalciferol (VITAMIN D3) 250 MCG (10000 UT) TABS Take 10,000 Units by mouth daily.    . clopidogrel (PLAVIX) 75 MG tablet TAKE 1 TABLET BY MOUTH ONCE A DAY   . fluticasone (FLONASE) 50 MCG/ACT nasal spray Place 2 sprays into both nostrils daily as needed. 01/06/2020: PRN allergies  . gabapentin (NEURONTIN) 400 MG capsule Take 1 capsule (400 mg total) by mouth 2 (two) times daily.   Marland Kitchen ipratropium-albuterol (DUONEB) 0.5-2.5 (3) MG/3ML SOLN Take 3 mLs by nebulization 3 (three) times daily as needed. (Patient taking differently: Take 3 mLs by nebulization 3 (three) times daily as needed (Shortness of breath). )   . Krill Oil 1000 MG CAPS Take 1,000 mg by mouth every other day.  (Patient not taking: Reported on 01/12/2020)   . lisinopril (ZESTRIL) 10 MG tablet Take 1 tablet (10 mg  total) by mouth daily.   . montelukast (SINGULAIR) 10 MG tablet Take 1 tablet (10 mg total) by mouth at bedtime.   . Multiple Vitamins-Minerals (MULTIVITAMIN ADULTS 50+) TABS Take 1 tablet by mouth daily.   . pantoprazole (PROTONIX) 20 MG tablet Take 1 tablet (20 mg total) by mouth daily.   Marland Kitchen Respiratory Therapy Supplies (NEBULIZER/TUBING/MOUTHPIECE) KIT Disp one nebulizer machine, tubing set and mouthpiece kit   . sertraline (ZOLOFT) 100 MG tablet TAKE 1 AND 1/2 TABLETS (150 MG TOTAL) BY MOUTH DAILY (Patient taking differently: Take 100 mg by mouth daily. )   . terbinafine (LAMISIL) 250 MG tablet Take 1 tablet (250 mg total) by mouth daily.   Marland Kitchen umeclidinium-vilanterol (ANORO ELLIPTA) 62.5-25 MCG/INH AEPB Inhale 1 puff into the lungs daily.   . varenicline (CHANTIX) 1 MG tablet Take 0.5 mg tablet by mouth 1x daily for 3 days, then increase to 0.5 mg tablet 2x daily for 4 days, then increase to 1 mg tablet 2x daily.   . vitamin C (ASCORBIC ACID) 500 MG tablet Take 1,000 mg by mouth every other day.  (Patient not taking: Reported on 01/12/2020)   . zinc gluconate 50 MG tablet Take 50 mg by mouth daily.    No facility-administered encounter medications on file as of 03/21/2020.     Goals Addressed              This Visit's Progress   .  "I need help finding a new place to live": (  pt-stated)        CARE PLAN ENTRY (see longitudinal plan of care for additional care plan information)  Current Barriers:  . Housing barriers  Clinical Social Work Clinical Goal(s):  Marland Kitchen Over the next 90 days, patient will work with the [facility] to address needs related to housing needs  Interventions: . Patient continues to make efforts to identify housing  . Patient's friends continue to help her pack her things for storage, however no housing has been identified . Patient continues to have trouble with her car insurance, due to the fact that she was driving during the time that her insurance had  lapsed, her insurance is now revoked and under penalty-patient will have to go before a judge to dispute this . Per patient, she cannot drive due to this which now has delayed her moving process . Patient's landlord confirms the move-out date remains 03/30/20-she will not provide a extension . Patient's driver's license renewed online, once received will provide to [employer] . Patient's birth certificate received . [place] application to be submitted once drivers license is received . This Education officer, museum discussed alternative options if an apartment is not found,(moving in with friends or emergency shelter) . It was confirmed that patient's daughter will not assist in this move . Continued encouragement provided to continue the search for an apartment emphasizing the need to manage her expectations and to allow assistance from friends in the community   Patient Self Care Activities:  . Performs ADL's independently . Calls provider office for new concerns or questions . Unable to perform IADLs independently  Please see past updates related to this goal by clicking on the "Past Updates" button in the selected goal          Follow Up Plan: SW will follow up with patient by phone over the next 7-14 business days   [place], [place] Worker  [facility] Care Management [phone]

## 2020-03-21 NOTE — Patient Instructions (Signed)
Thank you allowing the Chronic Care Management Team to be a part of your care! It was a pleasure speaking with you today!  1. Please follow up with the application for [facility] to get submitted as soon as possible  CCM (Chronic Care Management) Team   [name] RN, BSN Nurse Care Coordinator  [phone]  Corde Antonini [address], LCSW Clinical Social Worker [phone]  Goals Addressed              This Visit's Progress   .  "I need help finding a new place to live": (pt-stated)        CARE PLAN ENTRY (see longitudinal plan of care for additional care plan information)  Current Barriers:  . Housing barriers  Clinical Social Work Clinical Goal(s):  Marland Kitchen Over the next 90 days, patient will work with the [facility] to address needs related to housing needs  Interventions: . Patient continues to make efforts to identify housing  . Patient's friends continue to help her pack her things for storage, however no housing has been identified . Patient continues to have trouble with her car insurance, due to the fact that she was driving during the time that her insurance had lapsed, her insurance is now revoked and under penalty-patient will have to go before a judge to dispute this . Per patient, she cannot drive due to this which now has delayed her moving process . Patient's landlord confirms the move-out date remains 03/30/20-she will not provide a extension . Patient's driver's license renewed online, once received will provide to [facility] . Patient's birth certificate received . [employer] application to be submitted once drivers license is received . This Child psychotherapist discussed alternative options if an apartment is not found,(moving in with friends or emergency shelter) . It was confirmed that patient's daughter will not assist in this move . Continued encouragement provided to continue the search for an apartment emphasizing the need to manage her  expectations and to allow assistance from friends in the community   Patient Self Care Activities:  . Performs ADL's independently . Calls provider office for new concerns or questions . Unable to perform IADLs independently  Please see past updates related to this goal by clicking on the "Past Updates" button in the selected goal          The patient verbalized understanding of instructions provided today and declined a print copy of patient instruction materials.   Telephone follow up appointment with care management team member scheduled for: 03/27/20

## 2020-03-22 ENCOUNTER — Telehealth: Payer: Self-pay

## 2020-03-27 ENCOUNTER — Ambulatory Visit: Payer: Self-pay | Admitting: *Deleted

## 2020-03-27 DIAGNOSIS — F3341 Major depressive disorder, recurrent, in partial remission: Secondary | ICD-10-CM

## 2020-03-27 DIAGNOSIS — Z789 Other specified health status: Secondary | ICD-10-CM

## 2020-03-27 DIAGNOSIS — Z599 Problem related to housing and economic circumstances, unspecified: Secondary | ICD-10-CM

## 2020-03-27 NOTE — Chronic Care Management (AMB) (Signed)
Chronic Care Management    Clinical Social Work Follow Up Note  03/27/2020 Name: Andrea Holden MRN: [id number] DOB: 1946-06-21  Andrea Holden is a 74 y.o. year old female who is a primary care patient of [name], [place]. The CCM team was consulted for assistance with [name] .   Review of patient status, including review of consultants reports, other relevant assessments, and collaboration with appropriate care team members and the patient's provider was performed as part of comprehensive patient evaluation and provision of chronic care management services.    SDOH (Social Determinants of Health) assessments performed: No    Outpatient Encounter Medications as of 03/27/2020  Medication Sig Note  . acetaminophen (TYLENOL) 325 MG tablet Take 2 tablets (650 mg total) by mouth every 6 (six) hours as needed for mild pain, moderate pain, headache or fever.   Marland Kitchen albuterol (VENTOLIN HFA) 108 (90 Base) MCG/ACT inhaler Inhale 2 puffs into the lungs every 6 (six) hours as needed for wheezing or shortness of breath.   Marland Kitchen atorvastatin (LIPITOR) 10 MG tablet Take 1 tablet (10 mg total) by mouth at bedtime.   . Cholecalciferol (VITAMIN D3) 250 MCG (10000 UT) TABS Take 10,000 Units by mouth daily.    . clopidogrel (PLAVIX) 75 MG tablet TAKE 1 TABLET BY MOUTH ONCE A DAY   . fluticasone (FLONASE) 50 MCG/ACT nasal spray Place 2 sprays into both nostrils daily as needed. 01/06/2020: PRN allergies  . gabapentin (NEURONTIN) 400 MG capsule Take 1 capsule (400 mg total) by mouth 2 (two) times daily.   Marland Kitchen ipratropium-albuterol (DUONEB) 0.5-2.5 (3) MG/3ML SOLN Take 3 mLs by nebulization 3 (three) times daily as needed. (Patient taking differently: Take 3 mLs by nebulization 3 (three) times daily as needed (Shortness of breath). )   . Krill Oil 1000 MG CAPS Take 1,000 mg by mouth every other day.  (Patient not taking: Reported on 01/12/2020)   . lisinopril (ZESTRIL) 10 MG tablet Take 1 tablet (10 mg  total) by mouth daily.   . montelukast (SINGULAIR) 10 MG tablet Take 1 tablet (10 mg total) by mouth at bedtime.   . Multiple Vitamins-Minerals (MULTIVITAMIN ADULTS 50+) TABS Take 1 tablet by mouth daily.   . pantoprazole (PROTONIX) 20 MG tablet Take 1 tablet (20 mg total) by mouth daily.   Marland Kitchen Respiratory Therapy Supplies (NEBULIZER/TUBING/MOUTHPIECE) KIT Disp one nebulizer machine, tubing set and mouthpiece kit   . sertraline (ZOLOFT) 100 MG tablet TAKE 1 AND 1/2 TABLETS (150 MG TOTAL) BY MOUTH DAILY (Patient taking differently: Take 100 mg by mouth daily. )   . terbinafine (LAMISIL) 250 MG tablet Take 1 tablet (250 mg total) by mouth daily.   Marland Kitchen umeclidinium-vilanterol (ANORO ELLIPTA) 62.5-25 MCG/INH AEPB Inhale 1 puff into the lungs daily.   . varenicline (CHANTIX) 1 MG tablet Take 0.5 mg tablet by mouth 1x daily for 3 days, then increase to 0.5 mg tablet 2x daily for 4 days, then increase to 1 mg tablet 2x daily.   . vitamin C (ASCORBIC ACID) 500 MG tablet Take 1,000 mg by mouth every other day.  (Patient not taking: Reported on 01/12/2020)   . zinc gluconate 50 MG tablet Take 50 mg by mouth daily.    No facility-administered encounter medications on file as of 03/27/2020.     Goals Addressed              This Visit's Progress   .  "I need help finding a new place to live": (  pt-stated)        CARE PLAN ENTRY (see longitudinal plan of care for additional care plan information)  Current Barriers:  . Housing barriers  Clinical Social Work Clinical Goal(s):  Marland Kitchen Over the next 90 days, patient will work with the [facility] to address needs related to housing needs  Interventions: . Patient continues to make efforts to identify housing by 03/30/20 . It was confirmed that the [facility] helps people who are actively homeless . Patient's friends continue to help her pack her things for storage, friends have agreed to keep her dogs  . Patient has a friend taking her today to submit  paperwork for [facility] and to hopefully tour the apartment that is available . Patient discussed only wanting a 2 bedroom, however patient encouraged to consider what's available . Back up plan of a shelter or Motel discussed in the event the apartment at [facility] is not available by 03/30/20. Marland Kitchen Continued encouragement provided to continue the search for an apartment emphasizing the need to manage her expectations and to consider back up plan discussed.   Patient Self Care Activities:  . Performs ADL's independently . Calls provider office for new concerns or questions . Unable to perform IADLs independently  Please see past updates related to this goal by clicking on the "Past Updates" button in the selected goal          Follow Up Plan: SW will follow up with patient by phone over the next 7-14 business days   [place], [place] Worker  [facility] Care Management [phone]

## 2020-03-27 NOTE — Patient Instructions (Signed)
Thank you allowing the Chronic Care Management Team to be a part of your care! It was a pleasure speaking with you today!  1. Please follow up with this social worker regarding meeting with [employer] today   CCM (Chronic Care Management) Team   [name] RN, BSN Nurse Care Coordinator  [phone]  Ciarrah Rae, LCSW Clinical Social Worker [phone]  Goals Addressed              This Visit's Progress     "I need help finding a new place to live": (pt-stated)        CARE PLAN ENTRY (see longitudinal plan of care for additional care plan information)  Current Barriers:   Housing barriers  Clinical Social Work Clinical Goal(s):   Over the next 90 days, patient will work with the [facility] to address needs related to housing needs  Interventions:  Patient continues to make efforts to identify housing by 03/30/20  It was confirmed that the [facility] helps people who are actively homeless  Patient's friends continue to help her pack her things for storage, friends have agreed to keep her dogs   Patient has a friend taking her today to submit paperwork for [facility] and to hopefully tour the apartment that is available  Patient discussed only wanting a 2 bedroom, however patient encouraged to consider what's available  Back up plan of a shelter or Motel discussed in the event the apartment at [facility] is not available by 03/30/20.  Continued encouragement provided to continue the search for an apartment emphasizing the need to manage her expectations and to consider back up plan discussed.   Patient Self Care Activities:   Performs ADL's independently  Calls provider office for new concerns or questions  Unable to perform IADLs independently  Please see past updates related to this goal by clicking on the "Past Updates" button in the selected goal          The patient verbalized understanding of instructions  provided today and declined a print copy of patient instruction materials.   Telephone follow up appointment with care management team member scheduled for: 04/03/20

## 2020-03-28 ENCOUNTER — Encounter (INDEPENDENT_AMBULATORY_CARE_PROVIDER_SITE_OTHER): Payer: Medicare Other

## 2020-03-28 ENCOUNTER — Other Ambulatory Visit: Payer: Self-pay | Admitting: Family Medicine

## 2020-03-28 ENCOUNTER — Ambulatory Visit (INDEPENDENT_AMBULATORY_CARE_PROVIDER_SITE_OTHER): Payer: Medicare Other | Admitting: Vascular Surgery

## 2020-03-29 ENCOUNTER — Other Ambulatory Visit: Payer: Self-pay | Admitting: Family Medicine

## 2020-03-29 DIAGNOSIS — J45909 Unspecified asthma, uncomplicated: Secondary | ICD-10-CM

## 2020-03-31 ENCOUNTER — Ambulatory Visit (INDEPENDENT_AMBULATORY_CARE_PROVIDER_SITE_OTHER): Payer: Medicare (Managed Care) | Admitting: *Deleted

## 2020-03-31 DIAGNOSIS — Z599 Problem related to housing and economic circumstances, unspecified: Secondary | ICD-10-CM

## 2020-03-31 DIAGNOSIS — Z789 Other specified health status: Secondary | ICD-10-CM

## 2020-03-31 DIAGNOSIS — F3341 Major depressive disorder, recurrent, in partial remission: Secondary | ICD-10-CM

## 2020-03-31 NOTE — Patient Instructions (Addendum)
Thank you allowing the Chronic Care Management Team to be a part of your care! It was a pleasure speaking with you today!  1. Please continue to work on required documents for Andrea Holden for Housing consideration  CCM (Chronic Care Management) Team   [name] RN, BSN Nurse Care Coordinator  [phone]  [place], LCSW Clinical Social Worker [phone]  Goals Addressed              This Visit's Progress   .  "I need help finding a new place to live": (pt-stated)        CARE PLAN ENTRY (see longitudinal plan of care for additional care plan information)  Current Barriers:  . Housing barriers  Clinical Social Work Clinical Goal(s):  Marland Kitchen Over the next 90 days, patient will work with the [facility] to address needs related to housing needs  Interventions: . Patient confirms that she has moved out of her rental home and is now at the [facility] . It was confirmed that she did meet with [facility], however has a few more documents that she will need to complete the application for approval . Patient's friends and family helped her with the move and are bringing her meals . Patient's car is being repaired and she will now have to rely on friends for transportation for errands and medical appointments . Patient remains tearful and overwhelmed due to the move, emotional support provided along with the encouragement to complete the application process for [facility] for Housing as soon as possible.   Patient Self Care Activities:  . Performs ADL's independently . Calls provider office for new concerns or questions . Unable to perform IADLs independently  Please see past updates related to this goal by clicking on the "Past Updates" button in the selected goal          The patient verbalized understanding of instructions provided today and declined a print copy of patient instruction materials.   Telephone follow up appointment with  care management team member scheduled for:  04/10/20

## 2020-03-31 NOTE — Chronic Care Management (AMB) (Signed)
Chronic Care Management    Clinical Social Work Follow Up Note  03/31/2020 Name: Andrea Holden MRN: [id number] DOB: 1945-08-10  Andrea Holden is a 73 y.o. year old female who is a primary care patient of [name], [place]. The CCM team was consulted for assistance with [name] .   Review of patient status, including review of consultants reports, other relevant assessments, and collaboration with appropriate care team members and the patient's provider was performed as part of comprehensive patient evaluation and provision of chronic care management services.    SDOH (Social Determinants of Health) assessments performed: No    Outpatient Encounter Medications as of 03/31/2020  Medication Sig Note   acetaminophen (TYLENOL) 325 MG tablet Take 2 tablets (650 mg total) by mouth every 6 (six) hours as needed for mild pain, moderate pain, headache or fever.    albuterol (VENTOLIN HFA) 108 (90 Base) MCG/ACT inhaler Inhale 2 puffs into the lungs every 6 (six) hours as needed for wheezing or shortness of breath.    atorvastatin (LIPITOR) 10 MG tablet Take 1 tablet (10 mg total) by mouth at bedtime.    Cholecalciferol (VITAMIN D3) 250 MCG (10000 UT) TABS Take 10,000 Units by mouth daily.     clopidogrel (PLAVIX) 75 MG tablet TAKE 1 TABLET BY MOUTH ONCE A DAY    fluticasone (FLONASE) 50 MCG/ACT nasal spray Place 2 sprays into both nostrils daily as needed. 01/06/2020: PRN allergies   gabapentin (NEURONTIN) 400 MG capsule Take 1 capsule (400 mg total) by mouth 2 (two) times daily.    ipratropium-albuterol (DUONEB) 0.5-2.5 (3) MG/3ML SOLN Take 3 mLs by nebulization 3 (three) times daily as needed. (Patient taking differently: Take 3 mLs by nebulization 3 (three) times daily as needed (Shortness of breath). )    Krill Oil 1000 MG CAPS Take 1,000 mg by mouth every other day.  (Patient not taking: Reported on 01/12/2020)    lisinopril (ZESTRIL) 10 MG tablet Take 1 tablet (10 mg  total) by mouth daily.    montelukast (SINGULAIR) 10 MG tablet Take 1 tablet (10 mg total) by mouth at bedtime.    Multiple Vitamins-Minerals (MULTIVITAMIN ADULTS 50+) TABS Take 1 tablet by mouth daily.    pantoprazole (PROTONIX) 20 MG tablet Take 1 tablet (20 mg total) by mouth daily.    Respiratory Therapy Supplies (NEBULIZER/TUBING/MOUTHPIECE) KIT Disp one nebulizer machine, tubing set and mouthpiece kit    sertraline (ZOLOFT) 100 MG tablet TAKE 1 AND 1/2 TABLETS (150 MG TOTAL) BY MOUTH DAILY (Patient taking differently: Take 100 mg by mouth daily. )    terbinafine (LAMISIL) 250 MG tablet Take 1 tablet (250 mg total) by mouth daily.    umeclidinium-vilanterol (ANORO ELLIPTA) 62.5-25 MCG/INH AEPB Inhale 1 puff into the lungs daily.    varenicline (CHANTIX) 1 MG tablet Take 0.5 mg tablet by mouth 1x daily for 3 days, then increase to 0.5 mg tablet 2x daily for 4 days, then increase to 1 mg tablet 2x daily.    vitamin C (ASCORBIC ACID) 500 MG tablet Take 1,000 mg by mouth every other day.  (Patient not taking: Reported on 01/12/2020)    zinc gluconate 50 MG tablet Take 50 mg by mouth daily.    No facility-administered encounter medications on file as of 03/31/2020.     Goals Addressed              This Visit's Progress     "I need help finding a new place to live": (  pt-stated)        CARE PLAN ENTRY (see longitudinal plan of care for additional care plan information)  Current Barriers:   Housing barriers  Clinical Social Work Clinical Goal(s):   Over the next 90 days, patient will work with the [place] to address needs related to housing needs  Interventions:  Patient confirms that she has moved out of her rental home and is now at the [employer]  It was confirmed that she did meet with [employer], however has a few more documents that she will need to complete the application for approval  Patient's friends and family helped her with the move and are  bringing her meals  Patient's car is being repaired and she will now have to rely on friends for transportation for errands and medical appointments  Patient remains tearful and overwhelmed due to the move, emotional support provided along with the encouragement to complete the application process for [facility] for Housing as soon as possible.   Patient Self Care Activities:   Performs ADL's independently  Calls provider office for new concerns or questions  Unable to perform IADLs independently  Please see past updates related to this goal by clicking on the "Past Updates" button in the selected goal          Follow Up Plan: SW will follow up with patient by phone over the next 7-14 business days  [place], [place] Worker  [facility] Care Management [phone]

## 2020-04-03 ENCOUNTER — Telehealth: Payer: Self-pay

## 2020-04-04 ENCOUNTER — Ambulatory Visit: Payer: Self-pay | Admitting: Family Medicine

## 2020-04-05 NOTE — Telephone Encounter (Signed)
They dont know why sent they said disregard

## 2020-04-10 ENCOUNTER — Ambulatory Visit: Payer: Self-pay | Admitting: *Deleted

## 2020-04-10 DIAGNOSIS — F3341 Major depressive disorder, recurrent, in partial remission: Secondary | ICD-10-CM

## 2020-04-10 DIAGNOSIS — Z599 Problem related to housing and economic circumstances, unspecified: Secondary | ICD-10-CM

## 2020-04-10 DIAGNOSIS — Z789 Other specified health status: Secondary | ICD-10-CM

## 2020-04-11 NOTE — Patient Instructions (Signed)
Thank you allowing the Chronic Care Management Team to be a part of your care! It was a pleasure speaking with you today!  1. Please contact the [employer] for housing assistance  CCM (Chronic Care Management) Team   [name] RN, BSN Nurse Care Coordinator  [phone]  [place], LCSW Clinical Social Worker [phone]  Goals Addressed              This Visit's Progress   .  "I need help finding a new place to live": (pt-stated)        CARE PLAN ENTRY (see longitudinal plan of care for additional care plan information)  Current Barriers:  . Housing barriers  Clinical Social Work Clinical Goal(s):  Marland Kitchen Over the next 90 days, patient will work with the [facility] to address needs related to housing needs  Interventions: . Patient confirms that she has moved out of her rental home and is now at the [employer] she is running out of money to pay the weekly rate . This social worker recommended that she contact [employer] to request assistaqnce . It was confirmed that she did meet with [facility], however she has a few pending  documents that she will need to complete the application for approval:divorce decree, social security statement . Patient's friends and other residents of the motel continue to assist as needed bringing her meals, errands etc. . Patient's car is being repaired and she will now have to rely on friends for transportation for errands and medical appointments . Patient remains tearful and overwhelmed due to the move, emotional support provided along with the encouragement to complete the application process for [facility] for Housing as soon as possible. . Patient encouraged to contact the [place] [address] for assistance with the [facility] weekly fees   Patient Self Care Activities:  . Performs ADL's independently . Calls provider office for new concerns or questions . Unable to perform IADLs independently  Please  see past updates related to this goal by clicking on the "Past Updates" button in the selected goal          The patient verbalized understanding of instructions provided today and declined a print copy of patient instruction materials.   Telephone follow up appointment with care management team member scheduled for: 04/17/20

## 2020-04-11 NOTE — Chronic Care Management (AMB) (Signed)
Chronic Care Management    Clinical Social Work Follow Up Note  04/11/2020 Name: Andrea Holden MRN: [id number] DOB: [birth year]  Andrea Holden is a 74 y.o. year old female who is a primary care patient of [name], [place]. The CCM team was consulted for assistance with [name] .   Review of patient status, including review of consultants reports, other relevant assessments, and collaboration with appropriate care team members and the patient's provider was performed as part of comprehensive patient evaluation and provision of chronic care management services.    SDOH (Social Determinants of Health) assessments performed: No    Outpatient Encounter Medications as of 04/10/2020  Medication Sig Note  . acetaminophen (TYLENOL) 325 MG tablet Take 2 tablets (650 mg total) by mouth every 6 (six) hours as needed for mild pain, moderate pain, headache or fever.   Marland Kitchen albuterol (VENTOLIN HFA) 108 (90 Base) MCG/ACT inhaler INHALE 2 PUFFS INTO THE LUNGS EVERY 6 HOURS AS NEEDED FOR WHEEZING OR SHORTNESS OF BREATH   . atorvastatin (LIPITOR) 10 MG tablet Take 1 tablet (10 mg total) by mouth at bedtime.   . Cholecalciferol (VITAMIN D3) 250 MCG (10000 UT) TABS Take 10,000 Units by mouth daily.    . clopidogrel (PLAVIX) 75 MG tablet TAKE 1 TABLET BY MOUTH ONCE A DAY   . fluticasone (FLONASE) 50 MCG/ACT nasal spray Place 2 sprays into both nostrils daily as needed. 01/06/2020: PRN allergies  . gabapentin (NEURONTIN) 400 MG capsule Take 1 capsule (400 mg total) by mouth 2 (two) times daily.   Marland Kitchen ipratropium-albuterol (DUONEB) 0.5-2.5 (3) MG/3ML SOLN Take 3 mLs by nebulization 3 (three) times daily as needed. (Patient taking differently: Take 3 mLs by nebulization 3 (three) times daily as needed (Shortness of breath). )   . Krill Oil 1000 MG CAPS Take 1,000 mg by mouth every other day.  (Patient not taking: Reported on 01/12/2020)   . lisinopril (ZESTRIL) 10 MG tablet Take 1 tablet (10 mg total)  by mouth daily.   . montelukast (SINGULAIR) 10 MG tablet Take 1 tablet (10 mg total) by mouth at bedtime.   . Multiple Vitamins-Minerals (MULTIVITAMIN ADULTS 50+) TABS Take 1 tablet by mouth daily.   . pantoprazole (PROTONIX) 20 MG tablet Take 1 tablet (20 mg total) by mouth daily.   Marland Kitchen Respiratory Therapy Supplies (NEBULIZER/TUBING/MOUTHPIECE) KIT Disp one nebulizer machine, tubing set and mouthpiece kit   . sertraline (ZOLOFT) 100 MG tablet TAKE 1 AND 1/2 TABLETS (150 MG TOTAL) BY MOUTH DAILY (Patient taking differently: Take 100 mg by mouth daily. )   . terbinafine (LAMISIL) 250 MG tablet Take 1 tablet (250 mg total) by mouth daily.   Marland Kitchen umeclidinium-vilanterol (ANORO ELLIPTA) 62.5-25 MCG/INH AEPB Inhale 1 puff into the lungs daily.   . varenicline (CHANTIX) 1 MG tablet Take 0.5 mg tablet by mouth 1x daily for 3 days, then increase to 0.5 mg tablet 2x daily for 4 days, then increase to 1 mg tablet 2x daily.   . vitamin C (ASCORBIC ACID) 500 MG tablet Take 1,000 mg by mouth every other day.  (Patient not taking: Reported on 01/12/2020)   . zinc gluconate 50 MG tablet Take 50 mg by mouth daily.    No facility-administered encounter medications on file as of 04/10/2020.     Goals Addressed              This Visit's Progress   .  "I need help finding a new place to live": (pt-stated)  CARE PLAN ENTRY (see longitudinal plan of care for additional care plan information)  Current Barriers:  . Housing barriers  Clinical Social Work Clinical Goal(s):  Marland Kitchen Over the next 90 days, patient will work with the [facility] to address needs related to housing needs  Interventions: . Patient confirms that she has moved out of her rental home and is now at the [employer] she is running out of money to pay the weekly rate . This social worker recommended that she contact [employer] to request assistaqnce . It was confirmed that she did meet with [facility], however she has  a few pending  documents that she will need to complete the application for approval:divorce decree, social security statement . Patient's friends and other residents of the motel continue to assist as needed bringing her meals, errands etc. . Patient's car is being repaired and she will now have to rely on friends for transportation for errands and medical appointments . Patient remains tearful and overwhelmed due to the move, emotional support provided along with the encouragement to complete the application process for [facility] for Housing as soon as possible. . Patient encouraged to contact the [place] [address] for assistance with the [facility] weekly fees   Patient Self Care Activities:  . Performs ADL's independently . Calls provider office for new concerns or questions . Unable to perform IADLs independently  Please see past updates related to this goal by clicking on the "Past Updates" button in the selected goal          Follow Up Plan: SW will follow up with patient by phone over the next 7-14 business days    [place], [place] Worker  [facility] Care Management [phone]

## 2020-04-17 ENCOUNTER — Ambulatory Visit: Payer: Self-pay | Admitting: *Deleted

## 2020-04-17 DIAGNOSIS — Z599 Problem related to housing and economic circumstances, unspecified: Secondary | ICD-10-CM

## 2020-04-17 DIAGNOSIS — F3341 Major depressive disorder, recurrent, in partial remission: Secondary | ICD-10-CM | POA: Diagnosis not present

## 2020-04-17 DIAGNOSIS — Z789 Other specified health status: Secondary | ICD-10-CM

## 2020-04-17 NOTE — Patient Instructions (Signed)
Thank you allowing the Chronic Care Management Team to be a part of your care! It was a pleasure speaking with you today!  1. Please follow up with the [facility] regarding housing  2.   CCM (Chronic Care Management) Team   [name]  RN, BSN Nurse Care Coordinator  [phone]  [place], LCSW Clinical Social Worker [phone]  Goals Addressed              This Visit's Progress   .  "I need help finding a new place to live": (pt-stated)        CARE PLAN ENTRY (see longitudinal plan of care for additional care plan information)  Current Barriers:  . Housing barriers  Clinical Social Work Clinical Goal(s):  Marland Kitchen Over the next 90 days, patient will work with the [facility] to address needs related to housing needs  Interventions: . Patient continues to confirm that she has moved out of her rental home and is now at the [employer] she is running out of money to pay the weekly rate . Patient states that she has been in contact with [employer] for housing assistance and has also contacted the [employer] who suggested she call the [employer], [employer] and the [employer] . Patient has declined  . Patient confirmed that she has all of the required documents for the  [employer] [facility] and her friend is coming by today to pick it up for submission tomorrow. . Patient remains tearful,overwhelmed due to the move, and now has come down with a cold-emotional support provided along with the encouragement to complete the application process for [facility] for housing as soon as possible. . Patient encouraged to contact all community resources provided to explore housing options.   Patient Self Care Activities:  . Performs ADL's independently . Calls provider office for new concerns or questions . Unable to perform IADLs independently  Please see past updates related to this goal by clicking on the "Past  Updates" button in the selected goal          The patient verbalized understanding of instructions provided today and declined a print copy of patient instruction materials.   Telephone follow up appointment with care management team member scheduled for:  04/24/20

## 2020-04-17 NOTE — Chronic Care Management (AMB) (Signed)
Chronic Care Management    Clinical Social Work Follow Up Note  04/17/2020 Name: Andrea Holden MRN: [id number] DOB: 1945-08-31  Andrea Holden is a 74 y.o. year old female who is a primary care patient of [name], [place]. The CCM team was consulted for assistance with [name] .   Review of patient status, including review of consultants reports, other relevant assessments, and collaboration with appropriate care team members and the patient's provider was performed as part of comprehensive patient evaluation and provision of chronic care management services.    SDOH (Social Determinants of Health) assessments performed: No    Outpatient Encounter Medications as of 04/17/2020  Medication Sig Note  . acetaminophen (TYLENOL) 325 MG tablet Take 2 tablets (650 mg total) by mouth every 6 (six) hours as needed for mild pain, moderate pain, headache or fever.   Marland Kitchen albuterol (VENTOLIN HFA) 108 (90 Base) MCG/ACT inhaler INHALE 2 PUFFS INTO THE LUNGS EVERY 6 HOURS AS NEEDED FOR WHEEZING OR SHORTNESS OF BREATH   . atorvastatin (LIPITOR) 10 MG tablet Take 1 tablet (10 mg total) by mouth at bedtime.   . Cholecalciferol (VITAMIN D3) 250 MCG (10000 UT) TABS Take 10,000 Units by mouth daily.    . clopidogrel (PLAVIX) 75 MG tablet TAKE 1 TABLET BY MOUTH ONCE A DAY   . fluticasone (FLONASE) 50 MCG/ACT nasal spray Place 2 sprays into both nostrils daily as needed. 01/06/2020: PRN allergies  . gabapentin (NEURONTIN) 400 MG capsule Take 1 capsule (400 mg total) by mouth 2 (two) times daily.   Marland Kitchen ipratropium-albuterol (DUONEB) 0.5-2.5 (3) MG/3ML SOLN Take 3 mLs by nebulization 3 (three) times daily as needed. (Patient taking differently: Take 3 mLs by nebulization 3 (three) times daily as needed (Shortness of breath). )   . Krill Oil 1000 MG CAPS Take 1,000 mg by mouth every other day.  (Patient not taking: Reported on 01/12/2020)   . lisinopril (ZESTRIL) 10 MG tablet Take 1 tablet (10 mg total)  by mouth daily.   . montelukast (SINGULAIR) 10 MG tablet Take 1 tablet (10 mg total) by mouth at bedtime.   . Multiple Vitamins-Minerals (MULTIVITAMIN ADULTS 50+) TABS Take 1 tablet by mouth daily.   . pantoprazole (PROTONIX) 20 MG tablet Take 1 tablet (20 mg total) by mouth daily.   Marland Kitchen Respiratory Therapy Supplies (NEBULIZER/TUBING/MOUTHPIECE) KIT Disp one nebulizer machine, tubing set and mouthpiece kit   . sertraline (ZOLOFT) 100 MG tablet TAKE 1 AND 1/2 TABLETS (150 MG TOTAL) BY MOUTH DAILY (Patient taking differently: Take 100 mg by mouth daily. )   . terbinafine (LAMISIL) 250 MG tablet Take 1 tablet (250 mg total) by mouth daily.   Marland Kitchen umeclidinium-vilanterol (ANORO ELLIPTA) 62.5-25 MCG/INH AEPB Inhale 1 puff into the lungs daily.   . varenicline (CHANTIX) 1 MG tablet Take 0.5 mg tablet by mouth 1x daily for 3 days, then increase to 0.5 mg tablet 2x daily for 4 days, then increase to 1 mg tablet 2x daily.   . vitamin C (ASCORBIC ACID) 500 MG tablet Take 1,000 mg by mouth every other day.  (Patient not taking: Reported on 01/12/2020)   . zinc gluconate 50 MG tablet Take 50 mg by mouth daily.    No facility-administered encounter medications on file as of 04/17/2020.     Goals Addressed              This Visit's Progress   .  "I need help finding a new place to live": (pt-stated)  CARE PLAN ENTRY (see longitudinal plan of care for additional care plan information)  Current Barriers:  . Housing barriers  Clinical Social Work Clinical Goal(s):  Marland Kitchen Over the next 90 days, patient will work with the [facility] to address needs related to housing needs  Interventions: . Patient continues to confirm that she has moved out of her rental home and is now at the [employer] she is running out of money to pay the weekly rate . Patient states that she has been in contact with [employer] for housing assistance and has also contacted the [employer] who suggested she  call the [employer] . Patient has declined  . Patient confirmed that she has all of the required documents for the  [employer] and her friend is coming by today to pick it up for submission tomorrow. . Patient remains tearful,overwhelmed due to the move, and now has come down with a cold-emotional support provided along with the encouragement to complete the application process for [facility] for housing as soon as possible. . Patient encouraged to contact all community resources provided to explore housing options.   Patient Self Care Activities:  . Performs ADL's independently . Calls provider office for new concerns or questions . Unable to perform IADLs independently  Please see past updates related to this goal by clicking on the "Past Updates" button in the selected goal          Follow Up Plan: SW will follow up with patient by phone over the next 7-14 business days   [place], [place] Worker  [facility] Care Management [phone]

## 2020-04-18 ENCOUNTER — Other Ambulatory Visit: Payer: Self-pay

## 2020-04-18 ENCOUNTER — Ambulatory Visit: Payer: Medicare (Managed Care)

## 2020-04-19 ENCOUNTER — Telehealth: Payer: Self-pay | Admitting: *Deleted

## 2020-04-19 NOTE — Chronic Care Management (AMB) (Signed)
  Care Management   Note  04/19/2020 Name: Andrea Holden MRN: [id number] DOB: 1945/11/14  [name] is a 74 y.o. year old female who is a primary care patient of Sander Radon and is actively engaged with the care management team. I reached out to [employer] by phone today to assist with re-scheduling a follow up visit with the Pharmacist.  Follow up plan: Unsuccessful telephone outreach attempt made spoke to patient she was not able to speak to me due to another call about housing. The care management team will reach out to the patient again over the next 7 days. If patient returns call to provider office, please advise to call Embedded Care Management Care Guide Gwenevere Ghazi at [phone].  Gwenevere Ghazi  Care Guide, Embedded Care Coordination [facility] Management

## 2020-04-24 ENCOUNTER — Telehealth: Payer: Self-pay | Admitting: *Deleted

## 2020-04-24 NOTE — Telephone Encounter (Signed)
   Chronic Care Management   Unsuccessful Call Note 04/24/2020 Name: Andrea Holden MRN: [id number] DOB: 20-Feb-1946  Patient s a 74 year old female who sees Danelle Berry, PA-C for primary care. Danelle Berry, PA-C asked the CCM team to consult the patient for [facility].      This social worker was unable to reach patient via telephone today for follow up call. I have left HIPAA compliant voicemail asking patient to return my call. (unsuccessful outreach #1).   Plan: Will follow-up within 7 business days via telephone.     Verna Czech, LCSW Clinical Social [facility] Care Management [phone]

## 2020-04-26 ENCOUNTER — Telehealth: Payer: Self-pay | Admitting: *Deleted

## 2020-04-26 ENCOUNTER — Ambulatory Visit: Payer: Self-pay | Admitting: *Deleted

## 2020-04-26 DIAGNOSIS — Z599 Problem related to housing and economic circumstances, unspecified: Secondary | ICD-10-CM

## 2020-04-26 DIAGNOSIS — Z789 Other specified health status: Secondary | ICD-10-CM

## 2020-04-26 DIAGNOSIS — F3341 Major depressive disorder, recurrent, in partial remission: Secondary | ICD-10-CM

## 2020-04-26 NOTE — Patient Instructions (Signed)
Thank you allowing the Chronic Care Management Team to be a part of your care! It was a pleasure speaking with you today!  1. Please continue to follow up with housing options identified  CCM (Chronic Care Management) Team   Dorma Russell RN, BSN Nurse Care Coordinator  [phone]  Luie Laneve [address] LCSW Clinical Social Worker [phone]  Goals Addressed              This Visit's Progress   .  "I need help finding a new place to live": (pt-stated)        CARE PLAN ENTRY (see longitudinal plan of care for additional care plan information)  Current Barriers:  . Housing barriers  Clinical Social Work Clinical Goal(s):  Marland Kitchen Over the next 90 days, patient will work with the [facility] to address needs related to housing needs  Interventions: . Patient continues to resides at the [employer] is borrowing money from friends to pay the weekly rate  . Patient confirmed that she has all of the required documents for the  [employer] [facility] and has submitted it, she is awaiting a call for approval . She has also been looking at a rental home in [place], possible move in November 11/3 if her application is approved . Patient seems more positive and hopeful that permanent housing may be available soon . Patient encouraged to contact all community resources provided to explore housing options. . Positive reinforcement provided for progress made regarding houses   Patient Self Care Activities:  . Performs ADL's independently . Calls provider office for new concerns or questions . Unable to perform IADLs independently  Please see past updates related to this goal by clicking on the "Past Updates" button in the selected goal          The patient verbalized understanding of instructions provided today and declined a print copy of patient instruction materials.   Telephone follow up appointment with care management team member scheduled for: 05/10/20

## 2020-04-26 NOTE — Chronic Care Management (AMB) (Addendum)
Chronic Care Management    Clinical Social Work Follow Up Note  04/26/2020 Name: Andrea Holden MRN: [id number] DOB: 31-May-1946  Andrea Holden is a 74 y.o. year old female who is a primary care patient of [name], [place]. The CCM team was consulted for assistance with [name] .   Review of patient status, including review of consultants reports, other relevant assessments, and collaboration with appropriate care team members and the patient's provider was performed as part of comprehensive patient evaluation and provision of chronic care management services.    SDOH (Social Determinants of Health) assessments performed: No    Outpatient Encounter Medications as of 04/26/2020  Medication Sig Note   acetaminophen (TYLENOL) 325 MG tablet Take 2 tablets (650 mg total) by mouth every 6 (six) hours as needed for mild pain, moderate pain, headache or fever.    albuterol (VENTOLIN HFA) 108 (90 Base) MCG/ACT inhaler INHALE 2 PUFFS INTO THE LUNGS EVERY 6 HOURS AS NEEDED FOR WHEEZING OR SHORTNESS OF BREATH    atorvastatin (LIPITOR) 10 MG tablet Take 1 tablet (10 mg total) by mouth at bedtime.    Cholecalciferol (VITAMIN D3) 250 MCG (10000 UT) TABS Take 10,000 Units by mouth daily.     clopidogrel (PLAVIX) 75 MG tablet TAKE 1 TABLET BY MOUTH ONCE A DAY    fluticasone (FLONASE) 50 MCG/ACT nasal spray Place 2 sprays into both nostrils daily as needed. 01/06/2020: PRN allergies   gabapentin (NEURONTIN) 400 MG capsule Take 1 capsule (400 mg total) by mouth 2 (two) times daily.    ipratropium-albuterol (DUONEB) 0.5-2.5 (3) MG/3ML SOLN Take 3 mLs by nebulization 3 (three) times daily as needed. (Patient taking differently: Take 3 mLs by nebulization 3 (three) times daily as needed (Shortness of breath). )    Krill Oil 1000 MG CAPS Take 1,000 mg by mouth every other day.  (Patient not taking: Reported on 01/12/2020)    lisinopril (ZESTRIL) 10 MG tablet Take 1 tablet (10 mg total)  by mouth daily.    montelukast (SINGULAIR) 10 MG tablet Take 1 tablet (10 mg total) by mouth at bedtime.    Multiple Vitamins-Minerals (MULTIVITAMIN ADULTS 50+) TABS Take 1 tablet by mouth daily.    pantoprazole (PROTONIX) 20 MG tablet Take 1 tablet (20 mg total) by mouth daily.    Respiratory Therapy Supplies (NEBULIZER/TUBING/MOUTHPIECE) KIT Disp one nebulizer machine, tubing set and mouthpiece kit    sertraline (ZOLOFT) 100 MG tablet TAKE 1 AND 1/2 TABLETS (150 MG TOTAL) BY MOUTH DAILY (Patient taking differently: Take 100 mg by mouth daily. )    terbinafine (LAMISIL) 250 MG tablet Take 1 tablet (250 mg total) by mouth daily.    umeclidinium-vilanterol (ANORO ELLIPTA) 62.5-25 MCG/INH AEPB Inhale 1 puff into the lungs daily.    varenicline (CHANTIX) 1 MG tablet Take 0.5 mg tablet by mouth 1x daily for 3 days, then increase to 0.5 mg tablet 2x daily for 4 days, then increase to 1 mg tablet 2x daily.    vitamin C (ASCORBIC ACID) 500 MG tablet Take 1,000 mg by mouth every other day.  (Patient not taking: Reported on 01/12/2020)    zinc gluconate 50 MG tablet Take 50 mg by mouth daily.    No facility-administered encounter medications on file as of 04/26/2020.     Goals Addressed              This Visit's Progress     "I need help finding a new place to live": (pt-stated)  CARE PLAN ENTRY (see longitudinal plan of care for additional care plan information)  Current Barriers:   Housing barriers  Clinical Social Work Clinical Goal(s):   Over the next 90 days, patient will work with the [facility] to address needs related to housing needs  Interventions:  Patient continues to resides at the [employer] is borrowing money from friends to pay the weekly rate   Patient confirmed that she has all of the required documents for the  [employer] and has submitted it, she is awaiting a call for approval  She has also been looking at a rental home  in [place], possible move in November 38/1 if her application is approved  Patient seems more positive and hopeful that permanent housing may be available soon  Patient encouraged to contact all community resources provided to explore housing options.  Positive reinforcement provided for progress made regarding houses   Patient Self Care Activities:   Performs ADL's independently  Calls provider office for new concerns or questions  Unable to perform IADLs independently  Please see past updates related to this goal by clicking on the "Past Updates" button in the selected goal          Follow Up Plan: SW will follow up with patient by phone over the next 2 weeks   [place], [place] Worker  [facility] Care Management [phone]

## 2020-04-26 NOTE — Telephone Encounter (Signed)
   Chronic Care Management   Unsuccessful Call Note 04/26/2020 Name: Andrea Holden MRN: [id number] DOB: May 21, 1946  Patient  is a  74 year old female who sees Danelle Berry, PA-C for primary care. Danelle Berry, PA-C asked the CCM team to consult the patient for [employer] for housing.    This social worker was unable to reach patient via telephone today for follow up call. I have left HIPAA compliant voicemail asking patient to return my call. (unsuccessful outreach #2).   Plan: Will follow-up within 7 business days via telephone.     Verna Czech, LCSW Clinical Social Ecologist [facility] Care Management [phone]

## 2020-04-26 NOTE — Progress Notes (Signed)
Cardiology Office Note  Date:  04/28/2020   ID:  Andrea Holden, DOB 1945-09-03, MRN [id number]  PCP:  [name], PA-C   Chief Complaint  Patient presents with  . Follow-up    12 month. Meds reviewed by the pt. verbally.     HPI:  Andrea Holden is a 74 year old woman with past medical history of Diabetes, Obesity Hyperlipidemia, noncompliant with Lipitor PAD Smoker 63 years Who presents for follow-up of her aortic valve stenosis, murmur, PAD preoperative evaluation prior to left femoral endarterectomy  Stress at home Health that she has been living in for 15 years is being sold, she had to move out Living at [facility] [facility], too expensive, running short of money but reports she is putting money aside? Looking for another place to rent  Recent interventional studies reviewed with her in detail Had interventional procedure with Dr. Lucky Cowboy, 12/2019  Stent to the SMA with 6 mm diameter x 29 mm length balloon expandable VBX stent postdilated with a 7 mm balloon proximally  Prior intervention 05/2019:  Percutaneous transluminal angioplasty of left tibioperoneal trunk and peroneal artery with 3 mm diameter angioplasty balloon Percutaneous transluminal angioplasty of the left proximal SFA with 5 mm diameter by 15 cm Lutonix drug-coated angioplasty balloon Viabahn stent placement to the proximal left SFA with 6 mm diameter by 10 cm length stent for greater than 50% residual stenosis after angioplasty  Still smoking, down to 1/2 pack On generic Chantix Legs give out with walking Neuropathy, chronic back pain  Weight down 35 pounds in 1 year, 203 to 168 Changed her diet  EKG personally reviewed by myself on todays visit NSR rate 68 bpm, no ST or T wave changes  Other past medical history reviewed Low risk stress test December 2019   07/08/2018  Left common femoral and profunda femoris endarterectomies and patch angioplasty  SURGICAL SITE INFECTION  07/23/2018  12/07/2018 Had  right side completed Percutaneous transluminal angioplasty of right tibioperoneal trunk and proximal peroneal artery with 3 mm diameter angioplasty balloon Percutaneous transluminal angioplasty of the right SFA and proximal popliteal artery with 5 mm diameter by 30 cm length Lutonix drug-coated angioplasty balloon Viabahn stent   Does not walk very far without having to stop secondary to shortness of breath Chronic leg pain  CT scan chest 2012 showing three-vessel coronary disease, moderate to severe  May 27, 2018 had procedure with Dr. Lucky Cowboy Percutaneous transluminal angioplasty of left common femoral artery with 5 mm diameter by 4 cm length Lutonix drug-coated angioplasty balloon  Left lower extremity nearly occlusive highly calcific stenosis of the left common femoral artery of >90%.  The disease extended into the origins of both the profunda femoris and the superficial femoral artery although the profunda femoris artery disease was mild.  The superficial femoral artery was occluded in the proximal segment.  It reconstituted in the proximal to mid SFA although there was still some disease in the mid to distal SFA of greater than 70%.  The popliteal artery was relatively normal.  Tibioperoneal trunk had about an 80 to 90% stenosis with the peroneal artery distally.       Right lower extremity: There was about an 80 to 90% stenosis in the proximal superficial femoral artery.  The common femoral artery disease was fairly mild.  The mid SFA was occluded and reconstituted at Hunter's canal.  Again, there appeared to be stenosis in the tibioperoneal trunk     PMH:   has a past medical history  of Allergy, Anemia (2014), Anxiety, Aortic stenosis (12/24/2017), Arthritis, Asthma, Cardiac murmur (12/12/2017), Cataract, Cataract, Complication of anesthesia, Degenerative disc disease, lumbar, Degenerative disc disease, lumbar, Depression, Diabetes mellitus, Diabetic neuropathy ([place]) (11/16/2015),  Dysrhythmia, GERD (gastroesophageal reflux disease), H/O transfusion, History of chicken pox, History of hiatal hernia, History of measles, mumps, or rubella, HOH (hard of hearing), Hyperlipidemia, Hypertension, Irregular heartbeat, LVH (left ventricular hypertrophy) (12/24/2017), Neuropathy, Opiate use (11/16/2015), Peripheral arterial disease ([place]) (05/06/2018), Pes planus of both feet (11/16/2015), Plantar fasciitis of right foot (11/16/2015), Pneumonia, and Wheezing.  PSH:    Past Surgical History:  Procedure Laterality Date  . ABDOMINAL HYSTERECTOMY    . APPLICATION OF WOUND VAC Left 07/23/2018   Procedure: APPLICATION OF WOUND VAC;  Surgeon: Algernon Huxley, MD;  Location: [facility] OR[facility];  Service: Vascular;  Laterality: Left;  . banck injections    . CATARACT EXTRACTION W/PHACO Right 05/30/2015   Procedure: CATARACT EXTRACTION PHACO AND INTRAOCULAR LENS PLACEMENT (IOC);  Surgeon: Birder Robson, MD;  Location: [facility];  Service: Ophthalmology;  Laterality: Right;  [place] 00:57 AP% 20.9 CDE 11.99 fluid pack lot #[id number]  . CATARACT EXTRACTION W/PHACO Left 11/11/2019   Procedure: CATARACT EXTRACTION PHACO AND INTRAOCULAR LENS PLACEMENT ([place]) LEFT DIABETIC;  Surgeon: Birder Robson, MD;  Location: [facility];  Service: Ophthalmology;  Laterality: Left;  Lot #[id number] H [place]: 00:47.5 CDE: 6.24  . CHOLECYSTECTOMY  1970  . COLON SURGERY  2013   blocked colon  . COLONOSCOPY N/A 12/29/2019   Procedure: COLONOSCOPY;  Surgeon: Virgel Manifold, MD;  Location: [facility] ENDOSCOPY;  Service: Endoscopy;  Laterality: N/A;  . COLONOSCOPY WITH PROPOFOL N/A 01/20/2018   Procedure: COLONOSCOPY WITH PROPOFOL;  Surgeon: Lucilla Lame, MD;  Location: [facility] ENDOSCOPY;  Service: Endoscopy;  Laterality: N/A;  . ENDARTERECTOMY FEMORAL Left 07/08/2018   Procedure: ENDARTERECTOMY FEMORAL;  Surgeon: Algernon Huxley, MD;  Location: [facility];  Service: Vascular;  Laterality: Left;  . EYE SURGERY Right    cataract surgery  . FOOT FUSION  Right 2018   metal in foot  . GALLBLADDER SURGERY  1970  . GASTRIC BYPASS  2010   lost 178 lbs and regained 40 lbs last few years  . HUMERUS FRACTURE SURGERY Right    metal plate with screws  . internal bleeding  2016   ulcer in past  . LOWER EXTREMITY ANGIOGRAM Left 07/08/2018   Procedure: LOWER EXTREMITY ANGIOGRAM;  Surgeon: Algernon Huxley, MD;  Location: [facility];  Service: Vascular;  Laterality: Left;  . LOWER EXTREMITY ANGIOGRAPHY Left 05/27/2018   Procedure: LOWER EXTREMITY ANGIOGRAPHY;  Surgeon: Algernon Huxley, MD;  Location: [place] [facility];  Service: Cardiovascular;  Laterality: Left;  . LOWER EXTREMITY ANGIOGRAPHY Right 12/07/2018   Procedure: LOWER EXTREMITY ANGIOGRAPHY;  Surgeon: Algernon Huxley, MD;  Location: [place] [facility];  Service: Cardiovascular;  Laterality: Right;  . LOWER EXTREMITY ANGIOGRAPHY Left 05/10/2019   Procedure: LOWER EXTREMITY ANGIOGRAPHY;  Surgeon: Algernon Huxley, MD;  Location: [place] [facility];  Service: Cardiovascular;  Laterality: Left;  . MOUTH SURGERY     root canals and crowns and extractions  . MOUTH SURGERY  09/30/2019  . OVARY SURGERY    . SKIN GRAFT Right 2018   RT foot. foot has been rebuilt.  it is full of metal  . TENOTOMY ACHILLES TENDON Right    Percuntaneous. metal in foot  . TONSILLECTOMY    . VISCERAL ANGIOGRAPHY N/A 12/31/2019   Procedure: VISCERAL ANGIOGRAPHY;  Surgeon: Algernon Huxley,  MD;  Location: [place] [facility];  Service: Cardiovascular;  Laterality: N/A;  . WOUND DEBRIDEMENT Left 07/23/2018   Procedure: DEBRIDEMENT WOUND;  Surgeon: Algernon Huxley, MD;  Location: [facility];  Service: Vascular;  Laterality: Left;    Current Outpatient Medications  Medication Sig Dispense Refill  . acetaminophen (TYLENOL) 325 MG tablet Take 2 tablets (650 mg total) by mouth every 6 (six) hours as needed for mild pain, moderate pain, headache or fever. 30 tablet 0  . albuterol (VENTOLIN HFA) 108 (90 Base) MCG/ACT inhaler INHALE 2 PUFFS INTO THE  LUNGS EVERY 6 HOURS AS NEEDED FOR WHEEZING OR SHORTNESS OF BREATH 8.5 g 1  . atorvastatin (LIPITOR) 10 MG tablet Take 1 tablet (10 mg total) by mouth at bedtime. 90 tablet 3  . Cholecalciferol (VITAMIN D3) 250 MCG (10000 UT) TABS Take 10,000 Units by mouth daily.     . clopidogrel (PLAVIX) 75 MG tablet TAKE 1 TABLET BY MOUTH ONCE A DAY 90 tablet 0  . fluticasone (FLONASE) 50 MCG/ACT nasal spray Place 2 sprays into both nostrils daily as needed. 16 g 4  . gabapentin (NEURONTIN) 400 MG capsule Take 1 capsule (400 mg total) by mouth 2 (two) times daily. 180 capsule 1  . ipratropium-albuterol (DUONEB) 0.5-2.5 (3) MG/3ML SOLN Take 3 mLs by nebulization 3 (three) times daily as needed. (Patient taking differently: Take 3 mLs by nebulization 3 (three) times daily as needed (Shortness of breath). ) 180 mL 1  . lisinopril (ZESTRIL) 10 MG tablet Take 1 tablet (10 mg total) by mouth daily. 90 tablet 3  . montelukast (SINGULAIR) 10 MG tablet Take 1 tablet (10 mg total) by mouth at bedtime. 90 tablet 3  . Multiple Vitamins-Minerals (MULTIVITAMIN ADULTS 50+) TABS Take 1 tablet by mouth daily. 90 tablet 1  . pantoprazole (PROTONIX) 20 MG tablet Take 1 tablet (20 mg total) by mouth daily. 90 tablet 1  . Respiratory Therapy Supplies (NEBULIZER/TUBING/MOUTHPIECE) KIT Disp one nebulizer machine, tubing set and mouthpiece kit 1 kit 0  . sertraline (ZOLOFT) 100 MG tablet TAKE 1 AND 1/2 TABLETS (150 MG TOTAL) BY MOUTH DAILY (Patient taking differently: Take 100 mg by mouth daily. ) 135 tablet 3  . terbinafine (LAMISIL) 250 MG tablet Take 1 tablet (250 mg total) by mouth daily. 90 tablet 0  . umeclidinium-vilanterol (ANORO ELLIPTA) 62.5-25 MCG/INH AEPB Inhale 1 puff into the lungs daily. 60 each 11  . varenicline (CHANTIX) 1 MG tablet Take 0.5 mg tablet by mouth 1x daily for 3 days, then increase to 0.5 mg tablet 2x daily for 4 days, then increase to 1 mg tablet 2x daily. 53 tablet 0  . vitamin C (ASCORBIC ACID) 500 MG  tablet Take 1,000 mg by mouth every other day.     . zinc gluconate 50 MG tablet Take 50 mg by mouth daily.     No current facility-administered medications for this visit.     Allergies:   Meloxicam, Nsaids, and Sitagliptin   Social History:  The patient  reports that she has been smoking cigarettes. She started smoking about 59 years ago. She has a 27.50 pack-year smoking history. She has never used smokeless tobacco. She reports current alcohol use. She reports previous drug use. Drug: Marijuana.   Family History:   family history includes Arthritis in her brother and father; Asthma in her mother; COPD in her mother; Colitis in her daughter; Depression in her brother and father; Diabetes in her brother and maternal grandmother; Heart attack  in her brother and father; Heart disease in her brother and father; Hyperlipidemia in her brother; Hypertension in her brother and father; Stroke in her brother and father; Thyroid disease in her daughter; Vision loss in her brother.    Review of Systems: Review of Systems  Constitutional: Negative.   HENT: Negative.   Respiratory: Positive for shortness of breath.   Cardiovascular: Negative.   Gastrointestinal: Negative.   Musculoskeletal: Negative.        Leg weakness  Neurological: Negative.   Psychiatric/Behavioral: Negative.   All other systems reviewed and are negative.   PHYSICAL EXAM: VS:  BP (!) 100/54 (BP Location: Right Wrist, Patient Position: Sitting, Cuff Size: Normal)   Pulse 68   Ht '5\' 1"'  (1.549 m)   Wt 168 lb (76.2 kg)   SpO2 96%   BMI 31.74 kg/m  , BMI Body mass index is 31.74 kg/m. Constitutional:  oriented to person, place, and time. No distress.  HENT:  Head: Grossly normal Eyes:  no discharge. No scleral icterus.  Neck: No JVD, no carotid bruits  Cardiovascular: Regular rate and rhythm, no murmurs appreciated Pulmonary/Chest: Clear to auscultation bilaterally, no wheezes or rails Abdominal: Soft.  no distension.   no tenderness.  Musculoskeletal: Normal range of motion Neurological:  normal muscle tone. Coordination normal. No atrophy Skin: Skin warm and dry Psychiatric: normal affect, pleasant  Recent Labs: 12/26/2019: ALT 11 12/31/2019: BUN 14; Creatinine, Ser 0.76; Hemoglobin 10.0; Platelets 193; Potassium 4.8; Sodium 134    Lipid Panel Lab Results  Component Value Date   CHOL 134 11/30/2019   HDL 46 (L) 11/30/2019   LDLCALC 68 11/30/2019   TRIG 112 11/30/2019    Wt Readings from Last 3 Encounters:  04/28/20 168 lb (76.2 kg)  12/26/19 185 lb (83.9 kg)  11/30/19 189 lb 8 oz (86 kg)     ASSESSMENT AND PLAN:  Peripheral arterial disease (HCC)  Severe lower extremity arterial disease Followed by Lucky Cowboy, recent stents Still smoking  Diabetic polyneuropathy associated with type 2 diabetes mellitus (HCC) HBA1C 5.9,  Improved, down from 6.9  COPD, mild ([place]) Long smoking history Currently smoker 1/2 ppd, long discussion concerning need to quit  She will continue to work on this and does not want any assistance with chantix.   Mixed hyperlipidemia -  Stressed the importance of taking her Lipitor At goal  Weight loss Intentional, diet changes Down 35 pounds  Bruit - Carotid u/s  Less than 39% stenosis b/l  In 2019  Coronary artery disease of native artery of native heart with stable angina pectoris (HCC) Images from CT scan showing coronary disease, negative stress test Denies anginal symptoms  Aortic valve disease Normal cardiac function  Mild aortic valve stenosis in 2019 No further work-up at this time   Total encounter time more than 25 minutes  Greater than 50% was spent in counseling and coordination of care with the patient    Orders Placed This Encounter  Procedures  . EKG 12-Lead     Signed, Esmond Plants, M.D., Ph.D. 04/28/2020  [place], [place]

## 2020-04-28 ENCOUNTER — Ambulatory Visit: Payer: Medicare (Managed Care) | Admitting: Cardiovascular Disease

## 2020-04-28 ENCOUNTER — Other Ambulatory Visit: Payer: Self-pay

## 2020-04-28 ENCOUNTER — Encounter: Payer: Self-pay | Admitting: Cardiovascular Disease

## 2020-04-28 VITALS — BP 100/54 | HR 68 | Ht 61.0 in | Wt 168.0 lb

## 2020-04-28 DIAGNOSIS — R634 Abnormal weight loss: Secondary | ICD-10-CM

## 2020-04-28 DIAGNOSIS — I25119 Atherosclerotic heart disease of native coronary artery with unspecified angina pectoris: Secondary | ICD-10-CM

## 2020-04-28 DIAGNOSIS — I739 Peripheral vascular disease, unspecified: Secondary | ICD-10-CM | POA: Diagnosis not present

## 2020-04-28 DIAGNOSIS — Z87891 Personal history of nicotine dependence: Secondary | ICD-10-CM

## 2020-04-28 DIAGNOSIS — E782 Mixed hyperlipidemia: Secondary | ICD-10-CM

## 2020-04-28 DIAGNOSIS — E1151 Type 2 diabetes mellitus with diabetic peripheral angiopathy without gangrene: Secondary | ICD-10-CM

## 2020-04-28 DIAGNOSIS — I359 Nonrheumatic aortic valve disorder, unspecified: Secondary | ICD-10-CM

## 2020-04-28 DIAGNOSIS — I70219 Atherosclerosis of native arteries of extremities with intermittent claudication, unspecified extremity: Secondary | ICD-10-CM

## 2020-04-28 DIAGNOSIS — F17210 Nicotine dependence, cigarettes, uncomplicated: Secondary | ICD-10-CM

## 2020-04-28 DIAGNOSIS — E1142 Type 2 diabetes mellitus with diabetic polyneuropathy: Secondary | ICD-10-CM | POA: Diagnosis not present

## 2020-04-28 DIAGNOSIS — J449 Chronic obstructive pulmonary disease, unspecified: Secondary | ICD-10-CM

## 2020-04-28 DIAGNOSIS — Z6831 Body mass index (BMI) 31.0-31.9, adult: Secondary | ICD-10-CM

## 2020-04-28 NOTE — Patient Instructions (Signed)
Medication Instructions:  No changes  If you need a refill on your cardiac medications before your next appointment, please call your pharmacy.    Lab work: No new labs needed   If you have labs (blood work) drawn today and your tests are completely normal, you will receive your results only by: . MyChart Message (if you have MyChart) OR . A paper copy in the mail If you have any lab test that is abnormal or we need to change your treatment, we will call you to review the results.   Testing/Procedures: No new testing needed   Follow-Up: At [facility], you and your health needs are our priority.  As part of our continuing mission to provide you with exceptional heart care, we have created designated Provider Care Teams.  These Care Teams include your primary Cardiologist (physician) and Advanced Practice Providers (APPs -  Physician Assistants and Nurse Practitioners) who all work together to provide you with the care you need, when you need it.  . You will need a follow up appointment in 12 months  . Providers on your designated Care Team:   . Christopher Berge, NP . Ryan Dunn, PA-C . Jacquelyn Visser, PA-C  Any Other Special Instructions Will Be Listed Below (If Applicable).  COVID-19 Vaccine Information can be found at: [email or web address] For questions related to vaccine distribution or appointments, please email [email or web address] or call [phone].     

## 2020-05-10 ENCOUNTER — Ambulatory Visit: Payer: Self-pay | Admitting: *Deleted

## 2020-05-10 DIAGNOSIS — Z599 Problem related to housing and economic circumstances, unspecified: Secondary | ICD-10-CM

## 2020-05-10 DIAGNOSIS — Z789 Other specified health status: Secondary | ICD-10-CM

## 2020-05-10 DIAGNOSIS — F3341 Major depressive disorder, recurrent, in partial remission: Secondary | ICD-10-CM

## 2020-05-11 NOTE — Chronic Care Management (AMB) (Signed)
Chronic Care Management    Clinical Social Work Follow Up Note  05/11/2020 Name: Andrea Holden MRN: [id number] DOB: 01-Sep-1945  Andrea Holden is a 74 y.o. year old female who is a primary care patient of [name], [place]. The CCM team was consulted for assistance with [name] .   Review of patient status, including review of consultants reports, other relevant assessments, and collaboration with appropriate care team members and the patient's provider was performed as part of comprehensive patient evaluation and provision of chronic care management services.    SDOH (Social Determinants of Health) assessments performed: No    Outpatient Encounter Medications as of 05/10/2020  Medication Sig Note  . acetaminophen (TYLENOL) 325 MG tablet Take 2 tablets (650 mg total) by mouth every 6 (six) hours as needed for mild pain, moderate pain, headache or fever.   Marland Kitchen albuterol (VENTOLIN HFA) 108 (90 Base) MCG/ACT inhaler INHALE 2 PUFFS INTO THE LUNGS EVERY 6 HOURS AS NEEDED FOR WHEEZING OR SHORTNESS OF BREATH   . atorvastatin (LIPITOR) 10 MG tablet Take 1 tablet (10 mg total) by mouth at bedtime.   . Cholecalciferol (VITAMIN D3) 250 MCG (10000 UT) TABS Take 10,000 Units by mouth daily.    . clopidogrel (PLAVIX) 75 MG tablet TAKE 1 TABLET BY MOUTH ONCE A DAY   . fluticasone (FLONASE) 50 MCG/ACT nasal spray Place 2 sprays into both nostrils daily as needed. 01/06/2020: PRN allergies  . gabapentin (NEURONTIN) 400 MG capsule Take 1 capsule (400 mg total) by mouth 2 (two) times daily.   Marland Kitchen ipratropium-albuterol (DUONEB) 0.5-2.5 (3) MG/3ML SOLN Take 3 mLs by nebulization 3 (three) times daily as needed. (Patient taking differently: Take 3 mLs by nebulization 3 (three) times daily as needed (Shortness of breath). )   . lisinopril (ZESTRIL) 10 MG tablet Take 1 tablet (10 mg total) by mouth daily.   . montelukast (SINGULAIR) 10 MG tablet Take 1 tablet (10 mg total) by mouth at bedtime.   .  Multiple Vitamins-Minerals (MULTIVITAMIN ADULTS 50+) TABS Take 1 tablet by mouth daily.   . pantoprazole (PROTONIX) 20 MG tablet Take 1 tablet (20 mg total) by mouth daily.   Marland Kitchen Respiratory Therapy Supplies (NEBULIZER/TUBING/MOUTHPIECE) KIT Disp one nebulizer machine, tubing set and mouthpiece kit   . sertraline (ZOLOFT) 100 MG tablet TAKE 1 AND 1/2 TABLETS (150 MG TOTAL) BY MOUTH DAILY (Patient taking differently: Take 100 mg by mouth daily. )   . terbinafine (LAMISIL) 250 MG tablet Take 1 tablet (250 mg total) by mouth daily.   Marland Kitchen umeclidinium-vilanterol (ANORO ELLIPTA) 62.5-25 MCG/INH AEPB Inhale 1 puff into the lungs daily.   . varenicline (CHANTIX) 1 MG tablet Take 0.5 mg tablet by mouth 1x daily for 3 days, then increase to 0.5 mg tablet 2x daily for 4 days, then increase to 1 mg tablet 2x daily.   . vitamin C (ASCORBIC ACID) 500 MG tablet Take 1,000 mg by mouth every other day.    . zinc gluconate 50 MG tablet Take 50 mg by mouth daily.    No facility-administered encounter medications on file as of 05/10/2020.     Goals Addressed              This Visit's Progress   .  "I need help finding a new place to live": completed (pt-stated)        [place] (see longitudinal plan of care for additional care plan information)  Current Barriers:  . Housing barriers  Clinical Social Work Clinical Goal(s):  Marland Kitchen Over the next 90 days, patient will work with the [facility] to address needs related to housing needs  Interventions: . Patient continues to resides at the [facility] has now been approved for a rental . Patient confirmed that she has been approved for a rental and will be moving in tomorrow [address], [place] [address] . Per patient, she has also accepted at [facility] but has declined this optional  . Patient discussed finances will be tight but she feels that it will work out . Patient discussed being very relieved and grateful that she will be  moving in . Patient seems more positive and hopeful today stating that she has regained her faith which has helped  . Positive reinforcement provided for progress made regarding securing housing   Patient Self Care Activities:  . Performs ADL's independently . Calls provider office for new concerns or questions . Unable to perform IADLs independently  Please see past updates related to this goal by clicking on the "Past Updates" button in the selected goal          Follow Up Plan: SW will follow up with patient by phone over the next 14 business days  [place], [place] Worker  [facility] Care Management [phone]

## 2020-05-11 NOTE — Patient Instructions (Signed)
Thank you allowing the Chronic Care Management Team to be a part of your care! It was a pleasure speaking with you today!  1. Please call this social worker with any questions or concerns regarding your community resource needs  CCM (Chronic Care Management) Team   [name] RN, BSN Nurse Care Coordinator  [phone]  Moussa Wiegand [address] LCSW Clinical Social Worker [phone]  Goals Addressed              This Visit's Progress   .  "I need help finding a new place to live": completed (pt-stated)        CARE PLAN ENTRY (see longitudinal plan of care for additional care plan information)  Current Barriers:  . Housing barriers  Clinical Social Work Clinical Goal(s):  Marland Kitchen Over the next 90 days, patient will work with the [facility] to address needs related to housing needs  Interventions: . Patient continues to resides at the [facility] has now been approved for a rental . Patient confirmed that she has been approved for a rental and will be moving in tomorrow [address], [place] [address] . Per patient, she has also accepted at [facility] but has declined this optional  . Patient discussed finances will be tight but she feels that it will work out . Patient discussed being very relieved and grateful that she will be moving in . Patient seems more positive and hopeful today stating that she has regained her faith which has helped  . Positive reinforcement provided for progress made regarding securing housing   Patient Self Care Activities:  . Performs ADL's independently . Calls provider office for new concerns or questions . Unable to perform IADLs independently  Please see past updates related to this goal by clicking on the "Past Updates" button in the selected goal          The patient verbalized understanding of instructions provided today and declined a print copy of patient instruction materials.   Telephone follow up  appointment with care management team member scheduled for:  05/29/20

## 2020-05-29 ENCOUNTER — Telehealth: Payer: Self-pay

## 2020-05-29 ENCOUNTER — Telehealth: Payer: Self-pay | Admitting: *Deleted

## 2020-05-29 ENCOUNTER — Ambulatory Visit: Payer: Self-pay | Admitting: *Deleted

## 2020-05-29 DIAGNOSIS — F3341 Major depressive disorder, recurrent, in partial remission: Secondary | ICD-10-CM

## 2020-05-29 DIAGNOSIS — Z789 Other specified health status: Secondary | ICD-10-CM

## 2020-05-29 DIAGNOSIS — Z599 Problem related to housing and economic circumstances, unspecified: Secondary | ICD-10-CM

## 2020-05-29 NOTE — Chronic Care Management (AMB) (Signed)
Chronic Care Management    Clinical Social Work Follow Up Note  05/29/2020 Name: Andrea Holden MRN: [id number] DOB: [birth year]  Andrea Holden is a 74 y.o. year old female who is a primary care patient of [name], [place]. The CCM team was consulted for assistance with [name] .   Review of patient status, including review of consultants reports, other relevant assessments, and collaboration with appropriate care team members and the patient's provider was performed as part of comprehensive patient evaluation and provision of chronic care management services.    SDOH (Social Determinants of Health) assessments performed: No    Outpatient Encounter Medications as of 05/29/2020  Medication Sig Note  . acetaminophen (TYLENOL) 325 MG tablet Take 2 tablets (650 mg total) by mouth every 6 (six) hours as needed for mild pain, moderate pain, headache or fever.   Marland Kitchen albuterol (VENTOLIN HFA) 108 (90 Base) MCG/ACT inhaler INHALE 2 PUFFS INTO THE LUNGS EVERY 6 HOURS AS NEEDED FOR WHEEZING OR SHORTNESS OF BREATH   . atorvastatin (LIPITOR) 10 MG tablet Take 1 tablet (10 mg total) by mouth at bedtime.   . Cholecalciferol (VITAMIN D3) 250 MCG (10000 UT) TABS Take 10,000 Units by mouth daily.    . clopidogrel (PLAVIX) 75 MG tablet TAKE 1 TABLET BY MOUTH ONCE A DAY   . fluticasone (FLONASE) 50 MCG/ACT nasal spray Place 2 sprays into both nostrils daily as needed. 01/06/2020: PRN allergies  . gabapentin (NEURONTIN) 400 MG capsule Take 1 capsule (400 mg total) by mouth 2 (two) times daily.   Marland Kitchen ipratropium-albuterol (DUONEB) 0.5-2.5 (3) MG/3ML SOLN Take 3 mLs by nebulization 3 (three) times daily as needed. (Patient taking differently: Take 3 mLs by nebulization 3 (three) times daily as needed (Shortness of breath). )   . lisinopril (ZESTRIL) 10 MG tablet Take 1 tablet (10 mg total) by mouth daily.   . montelukast (SINGULAIR) 10 MG tablet Take 1 tablet (10 mg total) by mouth at bedtime.   .  Multiple Vitamins-Minerals (MULTIVITAMIN ADULTS 50+) TABS Take 1 tablet by mouth daily.   . pantoprazole (PROTONIX) 20 MG tablet Take 1 tablet (20 mg total) by mouth daily.   Marland Kitchen Respiratory Therapy Supplies (NEBULIZER/TUBING/MOUTHPIECE) KIT Disp one nebulizer machine, tubing set and mouthpiece kit   . sertraline (ZOLOFT) 100 MG tablet TAKE 1 AND 1/2 TABLETS (150 MG TOTAL) BY MOUTH DAILY (Patient taking differently: Take 100 mg by mouth daily. )   . terbinafine (LAMISIL) 250 MG tablet Take 1 tablet (250 mg total) by mouth daily.   Marland Kitchen umeclidinium-vilanterol (ANORO ELLIPTA) 62.5-25 MCG/INH AEPB Inhale 1 puff into the lungs daily.   . varenicline (CHANTIX) 1 MG tablet Take 0.5 mg tablet by mouth 1x daily for 3 days, then increase to 0.5 mg tablet 2x daily for 4 days, then increase to 1 mg tablet 2x daily.   . vitamin C (ASCORBIC ACID) 500 MG tablet Take 1,000 mg by mouth every other day.    . zinc gluconate 50 MG tablet Take 50 mg by mouth daily.    No facility-administered encounter medications on file as of 05/29/2020.     Goals Addressed              This Visit's Progress   .  "I need help finding a new place to live": completed (pt-stated)        [place] (see longitudinal plan of care for additional care plan information)  Current Barriers:  . Housing barriers  Clinical Social Work Clinical Goal(s):  Marland Kitchen Over the next 90 days, patient will work with the [facility] to address needs related to housing needs  Interventions: . Patient confirms that she has now moved, new address updated [address], [place] [address] $835.00 per month . Per patient, she has also accepted at [facility] but has declined this optional  . Patient continues to discuss finances will be tight but she feels that it will work out . Patient discussed being very relieved and grateful that she will be moving in . Patient discussed having a positive support system who is helping her to get  settled . Patient seems more positive and hopeful today stating that she has her dogs back . Positive reinforcement provided for progress made regarding securing housing   Patient Self Care Activities:  . Performs ADL's independently . Calls provider office for new concerns or questions . Unable to perform IADLs independently  Please see past updates related to this goal by clicking on the "Past Updates" button in the selected goal          Follow Up Plan: Client will contact this social worker with any additonal community resource needs   [place], [facility] Care Management [phone]

## 2020-05-29 NOTE — Telephone Encounter (Signed)
   Chronic Care Management   Unsuccessful Call Note 05/29/2020 Name: Andrea Holden MRN: [id number] DOB: Jun 09, 1946  Patient  is a 74 year old female who sees Danelle Berry, PA-C  for primary care. Danelle Berry, PA-C asked the CCM team to consult the patient for [facility].    This social worker was unable to reach patient via telephone today for follow up call regarding her housing. Patient's  voicemail  asking patient to return my call. (unsuccessful outreach #1).   Plan: Will follow-up within 7 business days via telephone.     Verna Czech, LCSW [facility] [phone]

## 2020-05-29 NOTE — Patient Instructions (Signed)
Thank you allowing the Chronic Care Management Team to be a part of your care! It was a pleasure speaking with you today!  1. Please call this social worker with any additional concerns regarding your community resource needs  CCM (Chronic Care Management) Team   [name] RN, BSN Nurse Care Coordinator  [phone]  Andrea Holden [address] LCSW Clinical Social Worker [phone]  Goals Addressed              This Visit's Progress   .  "I need help finding a new place to live": completed (pt-stated)        CARE PLAN ENTRY (see longitudinal plan of care for additional care plan information)  Current Barriers:  . Housing barriers  Clinical Social Work Clinical Goal(s):  Marland Kitchen Over the next 90 days, patient will work with the [facility] to address needs related to housing needs  Interventions: . Patient confirms that she has now moved, new address updated [address], [place] [address] $835.00 per month . Per patient, she has also accepted at [facility] but has declined this optional  . Patient continues to discuss finances will be tight but she feels that it will work out . Patient discussed being very relieved and grateful that she will be moving in . Patient discussed having a positive support system who is helping her to get settled . Patient seems more positive and hopeful today stating that she has her dogs back . Positive reinforcement provided for progress made regarding securing housing   Patient Self Care Activities:  . Performs ADL's independently . Calls provider office for new concerns or questions . Unable to perform IADLs independently  Please see past updates related to this goal by clicking on the "Past Updates" button in the selected goal          The patient verbalized understanding of instructions, educational materials, and care plan provided today and declined offer to receive copy of patient instructions, educational materials,  and care plan.   No further follow up required: patient agreed to call this social worker with any additonal questions or concerns regarding her community resource needs

## 2020-05-31 ENCOUNTER — Encounter: Payer: Self-pay | Admitting: Family Medicine

## 2020-05-31 ENCOUNTER — Telehealth: Payer: Self-pay

## 2020-05-31 ENCOUNTER — Other Ambulatory Visit: Payer: Self-pay

## 2020-05-31 ENCOUNTER — Ambulatory Visit (INDEPENDENT_AMBULATORY_CARE_PROVIDER_SITE_OTHER): Payer: Medicare (Managed Care) | Admitting: Family Medicine

## 2020-05-31 VITALS — BP 130/77 | HR 77 | Temp 98.1°F | Resp 16 | Ht 61.0 in | Wt 161.1 lb

## 2020-05-31 DIAGNOSIS — R21 Rash and other nonspecific skin eruption: Secondary | ICD-10-CM

## 2020-05-31 DIAGNOSIS — J309 Allergic rhinitis, unspecified: Secondary | ICD-10-CM

## 2020-05-31 DIAGNOSIS — E782 Mixed hyperlipidemia: Secondary | ICD-10-CM

## 2020-05-31 DIAGNOSIS — I739 Peripheral vascular disease, unspecified: Secondary | ICD-10-CM

## 2020-05-31 DIAGNOSIS — K219 Gastro-esophageal reflux disease without esophagitis: Secondary | ICD-10-CM

## 2020-05-31 DIAGNOSIS — E1142 Type 2 diabetes mellitus with diabetic polyneuropathy: Secondary | ICD-10-CM | POA: Diagnosis not present

## 2020-05-31 DIAGNOSIS — H9209 Otalgia, unspecified ear: Secondary | ICD-10-CM

## 2020-05-31 DIAGNOSIS — I25118 Atherosclerotic heart disease of native coronary artery with other forms of angina pectoris: Secondary | ICD-10-CM

## 2020-05-31 DIAGNOSIS — Z5181 Encounter for therapeutic drug level monitoring: Secondary | ICD-10-CM

## 2020-05-31 DIAGNOSIS — D649 Anemia, unspecified: Secondary | ICD-10-CM

## 2020-05-31 DIAGNOSIS — I70213 Atherosclerosis of native arteries of extremities with intermittent claudication, bilateral legs: Secondary | ICD-10-CM

## 2020-05-31 DIAGNOSIS — M8580 Other specified disorders of bone density and structure, unspecified site: Secondary | ICD-10-CM

## 2020-05-31 DIAGNOSIS — B351 Tinea unguium: Secondary | ICD-10-CM

## 2020-05-31 DIAGNOSIS — Z720 Tobacco use: Secondary | ICD-10-CM | POA: Diagnosis not present

## 2020-05-31 DIAGNOSIS — J449 Chronic obstructive pulmonary disease, unspecified: Secondary | ICD-10-CM

## 2020-05-31 DIAGNOSIS — E669 Obesity, unspecified: Secondary | ICD-10-CM

## 2020-05-31 DIAGNOSIS — F3341 Major depressive disorder, recurrent, in partial remission: Secondary | ICD-10-CM | POA: Diagnosis not present

## 2020-05-31 DIAGNOSIS — D692 Other nonthrombocytopenic purpura: Secondary | ICD-10-CM

## 2020-05-31 DIAGNOSIS — Z6831 Body mass index (BMI) 31.0-31.9, adult: Secondary | ICD-10-CM

## 2020-05-31 MED ORDER — PANTOPRAZOLE SODIUM 20 MG PO TBEC: 20.0000 mg | DELAYED_RELEASE_TABLET | Freq: Every day | ORAL | 1 refills | Status: AC

## 2020-05-31 MED ORDER — GABAPENTIN 400 MG PO CAPS: 400.0000 mg | ORAL_CAPSULE | Freq: Two times a day (BID) | ORAL | 1 refills | Status: AC

## 2020-05-31 NOTE — Progress Notes (Signed)
Name: Andrea Holden   MRN: [id number]    DOB: [birth year]   Date:05/31/2020       Progress Note  Chief Complaint  Patient presents with   Diabetes   Hyperlipidemia   Hypertension   Subjective:   Andrea Holden is a 74 y.o. female, presents to clinic for routine f/up  DM:  Off meds for 9 years Managed with diet/lifestyle right now Blood sugars not checking Denies: Polyuria, polydipsia, vision changes, neuropathy, hypoglycemia Recent pertinent labs: Lab Results  Component Value Date   HGBA1C 5.9 (H) 11/30/2019   HGBA1C 6.6 (H) 06/03/2019   HGBA1C 6.9 (H) 12/18/2018  Standard of care and health maintenance: Foot exam:  due DM eye exam:  UTD - due April 2022 ACEI/ARB:  On lisinopril Statin:  lipitor 10   Hyperlipidemia: Currently treated with lipitor 10 mg, pt reports good med compliance Last Lipids: Lab Results  Component Value Date   CHOL 134 11/30/2019   HDL 46 (L) 11/30/2019   LDLCALC 68 11/30/2019   TRIG 112 11/30/2019   CHOLHDL 2.9 11/30/2019   Hx of PAD, CAD - on plavix- procedures and management with cardiology and vascular No change to sx  COPD - continues to smoke, has been on and off chantix for years, CCM pharmacy team and myself have spent extensive amount of time trying to get chantix for her cheaper and have sent many many refills and med changes to the pharmacy - currently off chantix, unable to afford, continues to smoke Using neb/albuterol inhaler PRN, on anoro daily Does lung cancer screening  Hypertension:  Currently managed on lisinopril 10 mg Pt reports good med compliance and denies any SE.   Blood pressure today is well controlled. BP Readings from Last 3 Encounters:  05/31/20 130/77  04/28/20 (!) 100/54  12/31/19 (!) 146/64    AR - on singulair, flonase   Fungal nail disease - was put on lamisil in June - no f/up done   Pt complained after last routine visit that I did not check her ears - though she did not complain  about them and she did not return to address any acute complaints She does have hx of cerumen impaction  MDD: on zoloft - not sure of dose, was previously 150 mg daily but pt may have been taking 100 mg daily phq reviewed, negative today Depression screen [facility] 2/9 05/31/2020 11/30/2019 08/24/2019  Decreased Interest 0 0 0  Down, Depressed, Hopeless '1 1 1  ' PHQ - 2 Score '1 1 1  ' Altered sleeping - 1 0  Tired, decreased energy - 0 1  Change in appetite - 0 1  Feeling bad or failure about yourself  - 0 1  Trouble concentrating - 1 0  Moving slowly or fidgety/restless - - 0  Suicidal thoughts - 0 0  PHQ-9 Score - 3 4  Difficult doing work/chores - Somewhat difficult Somewhat difficult  Some recent data might be hidden   GERD:  On protonix, well controlled when on meds, does see GI  Since I have met pt - procedures done: 12/31/2019-Visceral angiography (N/A)  12/29/2019-Colonoscopy (N/A)  11/11/2019-Cataract extraction w/phaco (Left)  09/30/2019-Mouth surgery 05/10/2019-Lower extremity angiography (Left)  12/07/2018-Lower extremity angiography (Right)   Multiple other Dx and concerns addressed - see bottom of note   Current Outpatient Medications:    acetaminophen (TYLENOL) 325 MG tablet, Take 2 tablets (650 mg total) by mouth every 6 (six) hours as needed for mild pain, moderate pain, headache  or fever., Disp: 30 tablet, Rfl: 0   albuterol (VENTOLIN HFA) 108 (90 Base) MCG/ACT inhaler, INHALE 2 PUFFS INTO THE LUNGS EVERY 6 HOURS AS NEEDED FOR WHEEZING OR SHORTNESS OF BREATH, Disp: 8.5 g, Rfl: 1   atorvastatin (LIPITOR) 10 MG tablet, Take 1 tablet (10 mg total) by mouth at bedtime., Disp: 90 tablet, Rfl: 3   Cholecalciferol (VITAMIN D3) 250 MCG (10000 UT) TABS, Take 10,000 Units by mouth daily. , Disp: , Rfl:    clopidogrel (PLAVIX) 75 MG tablet, TAKE 1 TABLET BY MOUTH ONCE A DAY, Disp: 90 tablet, Rfl: 0   fluticasone (FLONASE) 50 MCG/ACT nasal spray, Place 2 sprays into both nostrils daily  as needed., Disp: 16 g, Rfl: 4   gabapentin (NEURONTIN) 400 MG capsule, Take 1 capsule (400 mg total) by mouth 2 (two) times daily., Disp: 180 capsule, Rfl: 1   ipratropium-albuterol (DUONEB) 0.5-2.5 (3) MG/3ML SOLN, Take 3 mLs by nebulization 3 (three) times daily as needed. (Patient taking differently: Take 3 mLs by nebulization 3 (three) times daily as needed (Shortness of breath). ), Disp: 180 mL, Rfl: 1   lisinopril (ZESTRIL) 10 MG tablet, Take 1 tablet (10 mg total) by mouth daily., Disp: 90 tablet, Rfl: 3   montelukast (SINGULAIR) 10 MG tablet, Take 1 tablet (10 mg total) by mouth at bedtime., Disp: 90 tablet, Rfl: 3   Multiple Vitamins-Minerals (MULTIVITAMIN ADULTS 50+) TABS, Take 1 tablet by mouth daily., Disp: 90 tablet, Rfl: 1   sertraline (ZOLOFT) 100 MG tablet, TAKE 1 AND 1/2 TABLETS (150 MG TOTAL) BY MOUTH DAILY (Patient taking differently: Take 100 mg by mouth daily. ), Disp: 135 tablet, Rfl: 3   vitamin C (ASCORBIC ACID) 500 MG tablet, Take 1,000 mg by mouth every other day. , Disp: , Rfl:    zinc gluconate 50 MG tablet, Take 50 mg by mouth daily., Disp: , Rfl:    pantoprazole (PROTONIX) 20 MG tablet, Take 1 tablet (20 mg total) by mouth daily., Disp: 90 tablet, Rfl: 1   Respiratory Therapy Supplies (NEBULIZER/TUBING/MOUTHPIECE) KIT, Disp one nebulizer machine, tubing set and mouthpiece kit (Patient not taking: Reported on 05/31/2020), Disp: 1 kit, Rfl: 0   terbinafine (LAMISIL) 250 MG tablet, Take 1 tablet (250 mg total) by mouth daily., Disp: 90 tablet, Rfl: 0   umeclidinium-vilanterol (ANORO ELLIPTA) 62.5-25 MCG/INH AEPB, Inhale 1 puff into the lungs daily., Disp: 60 each, Rfl: 11   varenicline (CHANTIX) 1 MG tablet, Take 0.5 mg tablet by mouth 1x daily for 3 days, then increase to 0.5 mg tablet 2x daily for 4 days, then increase to 1 mg tablet 2x daily., Disp: 53 tablet, Rfl: 0  Patient Active Problem List   Diagnosis Date Noted   Senile purpura ([place]) 05/31/2020    Enrolled in chronic care management 01/12/2020   Rectal bleeding 12/26/2019   Colitis 12/26/2019   Nicotine dependence 06/15/2019   Osteopenia 03/03/2019   Lower extremity edema 07/31/2018   Pressure injury of skin 07/24/2018   Surgical site infection 07/23/2018   Wound of left leg 07/23/2018   Atherosclerosis of artery of extremity with rest pain ([place]) 07/08/2018   Shortness of breath 06/02/2018   Bruit 06/02/2018   Coronary artery disease of native artery of native heart with stable angina pectoris ([place]) 06/02/2018   Atherosclerosis of native arteries of extremity with intermittent claudication ([place]) 05/19/2018   Peripheral arterial disease ([place]) 05/06/2018   Personal history of colonic polyps    Aortic stenosis 12/24/2017   LVH (  left ventricular hypertrophy) 12/24/2017   Hypertension with heart disease 12/24/2017   Left atrial dilatation 12/12/2017   Cardiac murmur 12/12/2017   Gastrojejunal ulcer 12/04/2017   Hyperphosphatemia 12/04/2017   Spinal stenosis of lumbar region 06/18/2017   Degeneration of lumbar intervertebral disc 06/02/2017   Assistance needed with transportation 05/26/2017   Financial difficulties 05/26/2017   Needs assistance with community resources 05/26/2017   Moderate recurrent major depression ([place]) 05/26/2017   Non-healing wound of lower extremity 02/06/2017   Status post ankle arthrodesis 12/11/2016   Controlled substance agreement broken 10/11/2016   Low back pain 09/25/2016   Osteoarthritis of right subtalar joint 07/11/2016   Posterior tibial tendinitis of right lower extremity 07/11/2016   Breast cancer screening 06/18/2016   Knee pain 04/03/2016   Hyponatremia 03/01/2016   High triglycerides 12/24/2015   Pes planus of both feet 11/16/2015   Plantar fasciitis of right foot 11/16/2015   Heel spur 11/16/2015   Diabetic neuropathy ([place]) 11/16/2015   Right ankle pain 11/16/2015   Medication monitoring  encounter 11/16/2015   Diabetes mellitus without complication ([place]) 47/42/5956   Chronic pain of multiple joints 08/15/2015   Abnormal mammogram of right breast 06/19/2015   Cerumen impaction 03/16/2015   Hyperlipidemia 12/14/2014   History of transfusion of packed RBC 12/14/2014   Hx of smoking 12/14/2014   Status post bariatric surgery 12/14/2014   Morbid obesity ([place]) 12/14/2014   Chronic radicular lumbar pain 12/14/2014   History of GI bleed 11/12/2014   History of small bowel obstruction 09/07/2014    Past Surgical History:  Procedure Laterality Date   ABDOMINAL HYSTERECTOMY     APPLICATION OF WOUND VAC Left 07/23/2018   Procedure: APPLICATION OF WOUND VAC;  Surgeon: Algernon Huxley, MD;  Location: [facility] ORS;  Service: Vascular;  Laterality: Left;   banck injections     CATARACT EXTRACTION W/PHACO Right 05/30/2015   Procedure: CATARACT EXTRACTION PHACO AND INTRAOCULAR LENS PLACEMENT ([place]);  Surgeon: Birder Robson, MD;  Location: [facility];  Service: Ophthalmology;  Laterality: Right;  [place] 00:57 AP% 20.9 CDE 11.99 fluid pack lot #[id number]   CATARACT EXTRACTION W/PHACO Left 11/11/2019   Procedure: CATARACT EXTRACTION PHACO AND INTRAOCULAR LENS PLACEMENT ([place]) LEFT DIABETIC;  Surgeon: Birder Robson, MD;  Location: [facility];  Service: Ophthalmology;  Laterality: Left;  Lot #[id number] H [place]: 00:47.5 CDE: 6.24   CHOLECYSTECTOMY  1970   COLON SURGERY  2013   blocked colon   COLONOSCOPY N/A 12/29/2019   Procedure: COLONOSCOPY;  Surgeon: Virgel Manifold, MD;  Location: [facility] END[facility];  Service: Endoscopy;  Laterality: N/A;   COLONOSCOPY WITH PROPOFOL N/A 01/20/2018   Procedure: COLONOSCOPY WITH PROPOFOL;  Surgeon: Lucilla Lame, MD;  Location: [facility] ENDOSCOPY;  Service: Endoscopy;  Laterality: N/A;   ENDARTERECTOMY FEMORAL Left 07/08/2018   Procedure: ENDARTERECTOMY FEMORAL;  Surgeon: Algernon Huxley, MD;  Location: [facility];  Service: Vascular;  Laterality: Left;    EYE SURGERY Right    cataract surgery   FOOT FUSION Right 2018   metal in foot   [place]  2010   lost 178 lbs and regained 40 lbs last few years   HUMERUS FRACTURE SURGERY Right    metal plate with screws   internal bleeding  2016   ulcer in past   LOWER EXTREMITY ANGIOGRAM Left 07/08/2018   Procedure: LOWER EXTREMITY ANGIOGRAM;  Surgeon: Algernon Huxley, MD;  Location: [facility];  Service: Vascular;  Laterality: Left;   LOWER  EXTREMITY ANGIOGRAPHY Left 05/27/2018   Procedure: LOWER EXTREMITY ANGIOGRAPHY;  Surgeon: Algernon Huxley, MD;  Location: [place] [facility];  Service: Cardiovascular;  Laterality: Left;   LOWER EXTREMITY ANGIOGRAPHY Right 12/07/2018   Procedure: LOWER EXTREMITY ANGIOGRAPHY;  Surgeon: Algernon Huxley, MD;  Location: [place] [facility];  Service: Cardiovascular;  Laterality: Right;   LOWER EXTREMITY ANGIOGRAPHY Left 05/10/2019   Procedure: LOWER EXTREMITY ANGIOGRAPHY;  Surgeon: Algernon Huxley, MD;  Location: [place] [facility];  Service: Cardiovascular;  Laterality: Left;   MOUTH SURGERY     root canals and crowns and extractions   MOUTH SURGERY  09/30/2019   OVARY SURGERY     SKIN GRAFT Right 2018   RT foot. foot has been rebuilt.  it is full of metal   TENOTOMY ACHILLES TENDON Right    Percuntaneous. metal in foot   TONSILLECTOMY     VISCERAL ANGIOGRAPHY N/A 12/31/2019   Procedure: VISCERAL ANGIOGRAPHY;  Surgeon: Algernon Huxley, MD;  Location: [place] [facility];  Service: Cardiovascular;  Laterality: N/A;   WOUND DEBRIDEMENT Left 07/23/2018   Procedure: DEBRIDEMENT WOUND;  Surgeon: Algernon Huxley, MD;  Location: [facility];  Service: Vascular;  Laterality: Left;    Family History  Problem Relation Age of Onset   Asthma Mother    COPD Mother    Arthritis Father    Depression Father    Heart disease Father    Hypertension Father    Stroke Father    Heart attack Father    Arthritis Brother    Depression  Brother    Diabetes Brother    Heart disease Brother    Hyperlipidemia Brother    Hypertension Brother    Stroke Brother    Vision loss Brother    Heart attack Brother    Diabetes Maternal Grandmother    Thyroid disease Daughter    Colitis Daughter    Breast cancer Neg Hx     Social History   Tobacco Use   Smoking status: Current Some Day Smoker    Packs/day: 0.50    Years: 55.00    Pack years: 27.50    Types: Cigarettes    Start date: 07/01/1960    Last attempt to quit: 11/22/2018    Years since quitting: 1.5   Smokeless tobacco: Never Used   Tobacco comment: trying to quit again; wants to use chantix but unable to afford  Vaping Use   Vaping Use: Never used  Substance Use Topics   Alcohol use: Yes    Alcohol/week: 0.0 standard drinks    Comment: 2 drinks per month   Drug use: Not Currently    Types: Marijuana    Comment: used for pain     Allergies  Allergen Reactions   Meloxicam Other (See Comments)    GI bleeding   Nsaids Other (See Comments)    Gi bleeding   Sitagliptin Hives and Itching    Januvia     Health Maintenance  Topic Date Due   FOOT EXAM  12/13/2018   INFLUENZA VACCINE  01/30/2020   MAMMOGRAM  03/02/2020   HEMOGLOBIN A1C  05/31/2020   OPHTHALMOLOGY EXAM  10/12/2020   DEXA SCAN  03/02/2021   COLONOSCOPY  12/28/2024   TETANUS/TDAP  08/14/2025   COVID-19 Vaccine  Completed   Hepatitis C Screening  Completed   PNA vac Low Risk Adult  Addressed    Chart Review Today: I personally reviewed active problem list, medication list, allergies,  family history, social history, health maintenance, notes from last encounter, lab results, imaging with the patient/caregiver today.  Review of Systems  10 Systems reviewed and are negative for acute change except as noted in the HPI.  Objective:   Vitals:   05/31/20 1114  BP: 130/77  Pulse: 77  Resp: 16  Temp: 98.1 F (36.7 C)  TempSrc: Oral  SpO2: 94%  Weight: 161  lb 1.6 oz (73.1 kg)  Height: '5\' 1"'  (1.549 m)    Body mass index is 30.44 kg/m.  Physical Exam Vitals and nursing note reviewed.  Constitutional:      General: She is not in acute distress.    Appearance: She is ill-appearing (chronically). She is not toxic-appearing or diaphoretic.     Comments: Appears stated age  HENT:     Head: Normocephalic and atraumatic.     Right Ear: Ear canal and external ear normal.     Left Ear: Ear canal and external ear normal.     Nose: Congestion and rhinorrhea present.     Mouth/Throat:     Mouth: Mucous membranes are moist.     Pharynx: Oropharynx is clear.  Eyes:     General:        Right eye: No discharge.        Left eye: No discharge.     Conjunctiva/sclera: Conjunctivae normal.  Cardiovascular:     Rate and Rhythm: Normal rate and regular rhythm.     Pulses: Normal pulses.     Heart sounds: Normal heart sounds.  Pulmonary:     Effort: Pulmonary effort is normal. No respiratory distress.     Breath sounds: Normal breath sounds. No stridor. No wheezing, rhonchi or rales.  Musculoskeletal:     Right lower leg: No edema.     Left lower leg: No edema.  Neurological:     Mental Status: She is alert. Mental status is at baseline.  Psychiatric:        Attention and Perception: Attention normal.        Mood and Affect: Mood is not anxious or depressed. Affect is labile and angry.        Speech: Speech normal.        Behavior: Behavior is uncooperative.        Cognition and Memory: Cognition and memory normal.       Diabetic Foot Exam - Simple   Simple Foot Form Diabetic Foot exam was performed with the following findings: Yes 05/31/2020 11:40 AM  Visual Inspection Sensation Testing Pulse Check Comments Severe PAD - pulses diminished      Assessment & Plan:     ICD-10-CM   1. Diabetic polyneuropathy associated with type 2 diabetes mellitus (HCC)  P71.06 COMPLETE METABOLIC PANEL WITH GFR    Hemoglobin A1c    gabapentin  (NEURONTIN) 400 MG capsule   discussed her recent labs, A1C keeps improving with only diet/lifestyle, recheck today, foot exam done  2. Mixed hyperlipidemia  Y69.4 COMPLETE METABOLIC PANEL WITH GFR    Lipid panel   compliant with statin, no SE or concerns  3. Recurrent major depressive disorder, in partial remission (HCC)  F33.41    on zoloft, phq neg today - suspect pt has mood disorder/personality disorder (borderline/histrionic/narcissistic personality disorder)  4. Currently attempting to quit smoking  Z72.0    smoking cessation and local resources reviewed and offered, unable to do anything about drug prices or her insurance coverage -we have done everything possible  5.  Peripheral arterial disease (HCC)  I73.9    see below - per vascular  6. Chronic obstructive pulmonary disease, unspecified COPD type ([place])  J44.9    she cannot afford meds right now, no change to her sx, chronic cough persists no wheeze, CP, and denies DOE  7. Class 1 obesity with serious comorbidity and body mass index (BMI) of 31.0 to 31.9 in adult, unspecified obesity type  E66.9    Z68.31    has lost some more weight - but pt states our scale is wrong - and she weight 155  8. Senile purpura (HCC)  D69.2    stable - scattered bruising to arms  9. Osteopenia, unspecified location  F68.12 COMPLETE METABOLIC PANEL WITH GFR   dexa reviewed - supplement with Vit D 3 and calcium - dexa every 2 years to monitor  10. Atherosclerosis of native artery of both lower extremities with intermittent claudication ([place])  I70.213    on statin and plavix - managed by vascular  11. Coronary artery disease of native artery of native heart with stable angina pectoris ([place])  I25.118    on statin, no current concerning sx, managed by cardiology  12. Fungal nail infection  X51.7 COMPLETE METABOLIC PANEL WITH GFR   lamisil in June - per podiatry  13. Allergic rhinitis, unspecified seasonality, unspecified trigger  J30.9    severe  congestion, erythema, profuse nasal discharge - pt instructed to add antihistamine to singulair for nose and ear sx, and restart flonase and nasal saline  14. Otalgia, unspecified laterality  H92.09    no cerumen impaction - right TM had some clear fluid, left TM looked slightly retracted, no infection, pt complains of itching - encouraged antihistamine  15. Anemia, unspecified type  D64.9 CBC with Differential/Platelet   drop in H/H after hospitalization - recheck today  16. GERD without esophagitis  K21.9 pantoprazole (PROTONIX) 20 MG tablet   hx of ulcer, she takes protonix every day, denies any sx when on meds - refilled ordered  17. Encounter for medication monitoring  Z51.81 CBC with Differential/Platelet    COMPLETE METABOLIC PANEL WITH GFR    Lipid panel    Hemoglobin A1c  18. Rash and nonspecific skin eruption  R21    Pt additionally asked me to look at her back today - states she has bumps and itches - scattered excoriations and scabs to back, no noted rash to extremities neck or face - did not seem like any pattern that could be due to bed bugs or scabies but bed bugs possible with her recent living conditions and changes in housing and staying in motels could possibly be contact dermatitis?  COVID questions about other preventative prescriptions that pt previously sent mychart message about and that was answered.    Return in about 4 months (around 09/29/2020) for Routine follow-up .    Pt stated in the past I refused to call her back - and at that time she was struggling and was feeling suicidal - I explained that I never got a message that said she was suicidal, she should go to the ER or walk in mental health clinics or call 911 if that ever happens again - and explained that I cannot make time for her unless she schedules that time with appointments.    I reviewed past phone call messages, copied here: Cathrine Muster, [facility]  Certified Medical Assistant    Telephone Encounter    Signed  Encounter Date:  02/28/2020  Signed       Called patient back, she states was upset called Friday with personal concerns for Domnique Vanegas to call her back. I notified patient that Kristeen Miss would be out for the next 2 weeks and that per our protocol phone call turn around time is 72hrs and that our physicians/providers usually do not call patients personally they have CMA's reach out.  I told her when she calls to try to state the reason for the call so it can be handled more appropriately.  I believe we thought it was regarding her chantix priscription that had previously been handle.  Pt states it is regarding her mental state and her wanting to apply for disability because a lot of people have been telling her she would qualify.  I asked patient regarding her mental health if she had ever seen psychologist?  She states yes but it has not worked for her.  I told her if she had any concerning symptoms such as suicidal thoughts to go to ER and she was also given information to [facility] the address and phone number to reach out to them.  As far as the disability I would check with [place] to see about what we need to do to try to that started.  I told patient it was a long process and lots of paperwork and lawyer that had to be involved, and even then just because people say  she needs it does not mean she would qualify.  An appointment was scheduled for Drianna Chandran for first available in October to address mental health and other chronic conditions.        Electronically signed by Cathrine Muster, [place] at 03/01/2020 3:15 PM  Telephone on 02/28/2020 I was out of office and this was not routed to me that I can recall and not that I can see in mychart documentation. Pt did not keep an appt in Oct - she cancelled appt that was scheduled for 10/5  Other patient complaints in the past include following her last OV - she called requesting that I order labs for other providers medications and management - the  pt was asked to make and appt or will need to be managed by the other provider. Documentation per Roselyn Reef 6/25/2021Cathrine Muster, [place]  12/24/19 4:40 PM Note I have spoke to this patient 2 different times today and spent several minutes on the phone explaining to her that Kristeen Miss will not put her name on orders for lab from a different doctor, because she would be held accountable if something was wrong with them.  She was also told she would need to make an acute care visit for other problems to be addressed such as toes and ears. She was not able to address those concerns at her follow up due to time and it being a new problem.  She was also given [place] # to get In touch with her regarding chantix.          After prepping chart, reviewing specialist visits, procedures, labs and chronic care management documentation and then seeing the patient today I spent over 40 minutes with her and more than 20 minutes in the exam room with her. When trying to leave the exam room today she said" she was not done yet"  She is unhappy with her care here She is mad that I did not call her back when she left a message with staff She wants prescriptions for medications to treat Covid in  case she gets it -I explained that there are no other FDA approved medications that I can prescribe for her and her best way to approach staying well as prevention with Covid vaccines and other conservative measures  Today had attempted to check her feet check her ears and will the things that she had previously complained about with her routine office visits She said that for "the money she spends" to come for an office visit 20 minutes is not with her money I explained that I have tried to give her more time today and I have tried to plan 40-minute space for her I had also asked her before and again today to come more frequently so that we can do less diagnoses and have more quality of time in her office visits more  frequently   I have discussed the pts comments with clinic administrator - Miel I have explained the demands and complaints to [facility] I have asked that the pt be dismissed from the practice.  I did tell the patient today that I have done all I can for her and explained med and mychart/phone call policies to her.   She refuses to make more appts to address her concerns, but complains that not enough is being done for her. I explained that she is free to see one of the other providers in clinic or establish care at another practice.    Last OV with me was 11/30/2019 and 08/24/2019 - since last office visit there have been more than 50 encounters with CCM documented with multiple CCM & team members and staff attempting to help the pt.    [name], PA-C 05/31/20 11:25 AM

## 2020-05-31 NOTE — Patient Instructions (Addendum)
Need to come more often for routine office visits to make sure all your chronic conditions and other acute problems are addressed.  Please follow up in 4 months for your routine diagnosis and medications Get additional appointment for other things you need - new diagnosis, pain, acute issues, etc.  I cannot do everything in 2 - 20 minute appointments a year.  We need more time.   Call to get your mammogram done - it was previously ordered [facility] at [facility] [address] [place],  [place]  [address] Get Driving Directions Main: [phone]   Health Maintenance  Topic Date Due  . Complete foot exam   12/13/2018  . Flu Shot  01/30/2020  . Mammogram  03/02/2020  . Hemoglobin A1C  05/31/2020  . Eye exam for diabetics  10/12/2020  . DEXA scan (bone density measurement)  03/02/2021  . Colon Cancer Screening  12/28/2024  . Tetanus Vaccine  08/14/2025  . COVID-19 Vaccine  Completed  .  Hepatitis C: One time screening is recommended by Center for Disease Control  (CDC) for  adults born from [birth year] through 1964.   Completed  . Pneumonia vaccines  Addressed     Preventing Osteoporosis, Adult Osteoporosis is a condition that causes the bones to lose density. This means that the bones become thinner, and the normal spaces in bone tissue become larger. Low bone density can make the bones weak and cause them to break more easily. Osteoporosis cannot always be prevented, but you can take steps to lower your risk of developing this condition. How can this condition affect me? If you develop osteoporosis, you will be more likely to break bones in your wrist, spine, or hip. Even a minor accident or injury can be enough to break weak bones. The bones will also be slower to heal. Osteoporosis can cause other problems as well, such as a stooped posture or trouble with movement. Osteoporosis can occur with aging. As you get older, you may lose bone tissue more quickly, or it  may be replaced more slowly. Osteoporosis is more likely to develop if you have poor nutrition or do not get enough calcium or vitamin D. Other lifestyle factors can also play a role. By eating a well-balanced diet and making lifestyle changes, you can help keep your bones strong and healthy, lowering your chances of developing osteoporosis. What can increase my risk? The following factors may make you more likely to develop osteoporosis:  Having a family history of the condition.  Having poor nutrition or not getting enough calcium or vitamin D.  Using certain medicines, such as steroid medicines or antiseizure medicines.  Being any of the following: ? 49 years of age or older. ? Female. ? A woman who has gone through menopause (is postmenopausal). ? White (Caucasian) or of Asian descent.  Smoking or having a history of smoking.  Not being physically active (being sedentary).  Having a small body frame. What actions can I take to prevent this?  Get enough calcium   Make sure you get enough calcium every day. Calcium is the most important mineral for bone health. Most people can get enough calcium from their diet, but supplements may be recommended for people who are at risk for osteoporosis. Follow these guidelines: ? If you are age 58 or younger, aim to get 1,000 mg of calcium every day. ? If you are older than age 58, aim to get 1,200 mg of calcium every day.  Good sources of calcium include: ? Dairy products, such as low-fat or nonfat milk, cheese, and yogurt. ? Dark green leafy vegetables, such as bok choy and broccoli. ? Foods that have had calcium added to them (calcium-fortified foods), such as orange juice, cereal, bread, soy beverages, and tofu products. ? Nuts, such as almonds.  Check nutrition labels to see how much calcium is in a food or drink. Get enough vitamin D  Try to get enough vitamin D every day. Vitamin D is the most essential vitamin for bone health. It  helps the body absorb calcium. Follow these guidelines for how much vitamin D to get from food: ? If you are age 61 or younger, aim to get at least 600 international units (IU) every day. Your health care provider may suggest more. ? If you are older than age 18, aim to get at least 800 international units every day. Your health care provider may suggest more.  Good sources of vitamin D in your diet include: ? Egg yolks. ? Oily fish, such as salmon, sardines, and tuna. ? Milk and cereal fortified with vitamin D.  Your body also makes vitamin D when you are out in the sun. Exposing the bare skin on your face, arms, legs, or back to the sun for no more than 30 minutes a day, 2 times a week is more than enough. Beyond that, make sure you use sunblock to protect your skin from sunburn, which increases your risk for skin cancer. Exercise  Stay active and get exercise every day.  Ask your health care provider what types of exercise are best for you. Weight-bearing and strength-building activities are important for building and maintaining healthy bones. Some examples of these types of activities include: ? Walking and hiking. ? Jogging and running. ? Dancing. ? Gym exercises. ? Lifting weights. ? Tennis and racquetball. ? Climbing stairs. ? Aerobics. Make other lifestyle changes  Do not use any products that contain nicotine or tobacco, such as cigarettes, e-cigarettes, and chewing tobacco. If you need help quitting, ask your health care provider.  Lose weight if you are overweight.  If you drink alcohol: ? Limit how much you use to:  0-1 drink a day for nonpregnant women.  0-2 drinks a day for men. ? Be aware of how much alcohol is in your drink. In the [place] one drink equals one 12 oz bottle of beer (355 mL), one 5 oz glass of wine (148 mL), or one 1 oz glass of hard liquor (44 mL). Where to find support If you need help making changes to prevent osteoporosis, talk with your health  care provider. You can ask for a referral to a diet and nutrition specialist (dietitian) and a physical therapist. Where to find more information Learn more about osteoporosis from:  NIH Osteoporosis and Related Bone Diseases National Resource Center: www.bones.http://www.myers.net/  U.S. Office on [employer] Health: http://hoffman.com/  National Osteoporosis Foundation: [employer] Summary  Osteoporosis is a condition that causes weak bones that are more likely to break.  Eat a healthy diet, making sure you get enough calcium and vitamin D, and stay active by getting regular exercise to help prevent osteoporosis.  Other ways to reduce your risk of osteoporosis include maintaining a healthy weight and avoiding alcohol and products that contain nicotine or tobacco. This information is not intended to replace advice given to you by your health care provider. Make sure you discuss any questions you have with your health care provider. Document Revised:  01/15/2019 Document Reviewed: 01/15/2019 Elsevier Patient Education  2020 [employer].

## 2020-06-01 NOTE — Chronic Care Management (AMB) (Signed)
  Care Management   Note  06/01/2020 Name: Andrea Holden MRN: [id number] DOB: Jun 09, 1946  Andrea Holden is a 74 y.o. year old female who is a primary care patient of No primary care provider on file. and is actively engaged with the care management team. I reached out to Carolene J Dorsi by phone today to assist with re-scheduling a follow up visit with the Pharmacist  Follow up plan: Unsuccessful telephone outreach attempt made. The care management team will reach out to the patient again over the next 7 days. If patient returns call to provider office, please advise to call Embedded Care Management Care Guide Gwenevere Ghazi at [phone].  Gwenevere Ghazi  Care Guide, Embedded Care Coordination [facility] Management

## 2020-06-05 ENCOUNTER — Ambulatory Visit: Payer: Self-pay | Admitting: *Deleted

## 2020-06-05 DIAGNOSIS — E782 Mixed hyperlipidemia: Secondary | ICD-10-CM

## 2020-06-05 DIAGNOSIS — E1142 Type 2 diabetes mellitus with diabetic polyneuropathy: Secondary | ICD-10-CM

## 2020-06-05 DIAGNOSIS — F3341 Major depressive disorder, recurrent, in partial remission: Secondary | ICD-10-CM

## 2020-06-05 NOTE — Patient Instructions (Signed)
Thank you allowing the Chronic Care Management Team to be a part of your care! It was a pleasure speaking with you today!  1. Please be sure to follow up with a new primary care provider of choice  CCM (Chronic Care Management) Team   [name] RN, BSN Nurse Care Coordinator  [phone]  Andrea Holden [address], LCSW Clinical Social Worker [phone]  Goals Addressed              This Visit's Progress   .  "I need help finding a new place to live": completed (pt-stated)        CARE PLAN ENTRY (see longitudinal plan of care for additional care plan information)  Current Barriers:  . Housing barriers  Clinical Social Work Clinical Goal(s):  Marland Kitchen Over the next 90 days, patient will work with the [facility] to address needs related to housing needs  Interventions: . Patient continues to confirm that she has now moved, new address updated [address] [place], [place] [address] $835.00 per month . Her new home requires some repairs, however she has requested this through the landlord . Patient confirms plan to change providers and understands that she must have a CCM provider in order to receive CCM services . Patient declined having any additional community resource needs at this time  . Patient continues to confirm having a positive support system  . Positive reinforcement provided for progress made regarding securing housing   Patient Self Care Activities:  . Performs ADL's independently . Calls provider office for new concerns or questions . Unable to perform IADLs independently  Please see past updates related to this goal by clicking on the "Past Updates" button in the selected goal          The patient verbalized understanding of instructions, educational materials, and care plan provided today and declined offer to receive copy of patient instructions, educational materials, and care plan.   No further follow up required: patient has plans to switch to a  different provider office

## 2020-06-05 NOTE — Chronic Care Management (AMB) (Signed)
Chronic Care Management   Social Work Note  06/05/2020 Name: Andrea Holden MRN: [id number] DOB: 01/24/1946  Andrea Holden is a 74 y.o. year old female who sees No primary care provider on file. for primary care. The CCM team was consulted for assistance with [name] .    SDOH (Social Determinants of Health) assessments performed: No     Outpatient Encounter Medications as of 06/05/2020  Medication Sig Note  . acetaminophen (TYLENOL) 325 MG tablet Take 2 tablets (650 mg total) by mouth every 6 (six) hours as needed for mild pain, moderate pain, headache or fever.   Marland Kitchen albuterol (VENTOLIN HFA) 108 (90 Base) MCG/ACT inhaler INHALE 2 PUFFS INTO THE LUNGS EVERY 6 HOURS AS NEEDED FOR WHEEZING OR SHORTNESS OF BREATH   . atorvastatin (LIPITOR) 10 MG tablet Take 1 tablet (10 mg total) by mouth at bedtime.   . Cholecalciferol (VITAMIN D3) 250 MCG (10000 UT) TABS Take 10,000 Units by mouth daily.    . clopidogrel (PLAVIX) 75 MG tablet TAKE 1 TABLET BY MOUTH ONCE A DAY   . fluticasone (FLONASE) 50 MCG/ACT nasal spray Place 2 sprays into both nostrils daily as needed. 01/06/2020: PRN allergies  . gabapentin (NEURONTIN) 400 MG capsule Take 1 capsule (400 mg total) by mouth 2 (two) times daily.   Marland Kitchen ipratropium-albuterol (DUONEB) 0.5-2.5 (3) MG/3ML SOLN Take 3 mLs by nebulization 3 (three) times daily as needed. (Patient taking differently: Take 3 mLs by nebulization 3 (three) times daily as needed (Shortness of breath). )   . lisinopril (ZESTRIL) 10 MG tablet Take 1 tablet (10 mg total) by mouth daily.   . montelukast (SINGULAIR) 10 MG tablet Take 1 tablet (10 mg total) by mouth at bedtime.   . Multiple Vitamins-Minerals (MULTIVITAMIN ADULTS 50+) TABS Take 1 tablet by mouth daily.   . pantoprazole (PROTONIX) 20 MG tablet Take 1 tablet (20 mg total) by mouth daily.   Marland Kitchen Respiratory Therapy Supplies (NEBULIZER/TUBING/MOUTHPIECE) KIT Disp one nebulizer machine, tubing set and mouthpiece kit  (Patient not taking: Reported on 05/31/2020)   . sertraline (ZOLOFT) 100 MG tablet TAKE 1 AND 1/2 TABLETS (150 MG TOTAL) BY MOUTH DAILY (Patient taking differently: Take 100 mg by mouth daily. )   . umeclidinium-vilanterol (ANORO ELLIPTA) 62.5-25 MCG/INH AEPB Inhale 1 puff into the lungs daily.   . vitamin C (ASCORBIC ACID) 500 MG tablet Take 1,000 mg by mouth every other day.    . zinc gluconate 50 MG tablet Take 50 mg by mouth daily.    No facility-administered encounter medications on file as of 06/05/2020.    Goals Addressed              This Visit's Progress   .  "I need help finding a new place to live": completed (pt-stated)        [place] (see longitudinal plan of care for additional care plan information)  Current Barriers:  . Housing barriers  Clinical Social Work Clinical Goal(s):  Marland Kitchen Over the next 90 days, patient will work with the [facility] to address needs related to housing needs  Interventions: . Patient continues to confirm that she has now moved, new address updated [address], [place] [address] $835.00 per month . Her new home requires some repairs, however she has requested this through the landlord . Patient confirms plan to change providers and understands that she must have a CCM provider in order to receive CCM services . Patient declined having  any additional community resource needs at this time  . Patient continues to confirm having a positive support system  . Positive reinforcement provided for progress made regarding securing housing   Patient Self Care Activities:  . Performs ADL's independently . Calls provider office for new concerns or questions . Unable to perform IADLs independently  Please see past updates related to this goal by clicking on the "Past Updates" button in the selected goal         Follow Up Plan: Client will choose a new PCP and schedule accordingly, no further follow up needed  Andrea Holden,  [facility] Care Management [phone]

## 2020-06-08 NOTE — Chronic Care Management (AMB) (Signed)
Per CCM Note on 06/05/2020 from C. Land LCSW patient is changing her PCP.

## 2020-06-20 ENCOUNTER — Telehealth: Payer: Self-pay

## 2020-06-20 NOTE — Telephone Encounter (Signed)
Copied from CRM [id number]. Topic: General - Other >> Jun 20, 2020  2:56 PM Jaquita Rector A wrote: Reason for CRM:  Thayer Ohm with [facility] - [place], [place] - [address]  Phone: [phone] called in to say to Danelle Berry that patient lost her gabapentin (NEURONTIN) 400 MG capsule asking for permission to fill early please call pharmacy

## 2020-06-26 ENCOUNTER — Ambulatory Visit: Payer: Self-pay | Admitting: *Deleted

## 2020-06-26 NOTE — Chronic Care Management (AMB) (Signed)
  Chronic Care Management   Social Work Note  06/26/2020 Name: Andrea Holden MRN: [id number] DOB: [birth year]  Andrea Holden is a 74 y.o. year old female who sees No primary care provider on file. for primary care. The CCM team was consulted for assistance with [name] .   Phone call from patient requesting list of providers for primary care. A list of providers provided to patient for possible follow up.  SDOH (Social Determinants of Health) assessments performed: No     Outpatient Encounter Medications as of 06/26/2020  Medication Sig Note  . acetaminophen (TYLENOL) 325 MG tablet Take 2 tablets (650 mg total) by mouth every 6 (six) hours as needed for mild pain, moderate pain, headache or fever.   Marland Kitchen albuterol (VENTOLIN HFA) 108 (90 Base) MCG/ACT inhaler INHALE 2 PUFFS INTO THE LUNGS EVERY 6 HOURS AS NEEDED FOR WHEEZING OR SHORTNESS OF BREATH   . atorvastatin (LIPITOR) 10 MG tablet Take 1 tablet (10 mg total) by mouth at bedtime.   . Cholecalciferol (VITAMIN D3) 250 MCG (10000 UT) TABS Take 10,000 Units by mouth daily.    . clopidogrel (PLAVIX) 75 MG tablet TAKE 1 TABLET BY MOUTH ONCE A DAY   . fluticasone (FLONASE) 50 MCG/ACT nasal spray Place 2 sprays into both nostrils daily as needed. 01/06/2020: PRN allergies  . gabapentin (NEURONTIN) 400 MG capsule Take 1 capsule (400 mg total) by mouth 2 (two) times daily.   Marland Kitchen ipratropium-albuterol (DUONEB) 0.5-2.5 (3) MG/3ML SOLN Take 3 mLs by nebulization 3 (three) times daily as needed. (Patient taking differently: Take 3 mLs by nebulization 3 (three) times daily as needed (Shortness of breath). )   . lisinopril (ZESTRIL) 10 MG tablet Take 1 tablet (10 mg total) by mouth daily.   . montelukast (SINGULAIR) 10 MG tablet Take 1 tablet (10 mg total) by mouth at bedtime.   . Multiple Vitamins-Minerals (MULTIVITAMIN ADULTS 50+) TABS Take 1 tablet by mouth daily.   . pantoprazole (PROTONIX) 20 MG tablet Take 1 tablet (20 mg total) by  mouth daily.   Marland Kitchen Respiratory Therapy Supplies (NEBULIZER/TUBING/MOUTHPIECE) KIT Disp one nebulizer machine, tubing set and mouthpiece kit (Patient not taking: Reported on 05/31/2020)   . sertraline (ZOLOFT) 100 MG tablet TAKE 1 AND 1/2 TABLETS (150 MG TOTAL) BY MOUTH DAILY (Patient taking differently: Take 100 mg by mouth daily. )   . umeclidinium-vilanterol (ANORO ELLIPTA) 62.5-25 MCG/INH AEPB Inhale 1 puff into the lungs daily.   . vitamin C (ASCORBIC ACID) 500 MG tablet Take 1,000 mg by mouth every other day.    . zinc gluconate 50 MG tablet Take 50 mg by mouth daily.    No facility-administered encounter medications on file as of 06/26/2020.    Goals Addressed   None     Follow Up Plan: patient will research new primary care provider and schedule follow up  Elliot Gurney, [place] Worker  [facility] Care Management [phone]

## 2020-06-26 NOTE — Telephone Encounter (Signed)
Pharmacy notified to refill early. [facility] Pharmacist notified

## 2020-06-27 ENCOUNTER — Encounter: Payer: Self-pay | Admitting: Podiatry

## 2020-06-27 ENCOUNTER — Ambulatory Visit (INDEPENDENT_AMBULATORY_CARE_PROVIDER_SITE_OTHER): Payer: Medicare (Managed Care) | Admitting: Podiatry

## 2020-06-27 ENCOUNTER — Other Ambulatory Visit: Payer: Self-pay

## 2020-06-27 DIAGNOSIS — L600 Ingrowing nail: Secondary | ICD-10-CM

## 2020-06-27 DIAGNOSIS — L603 Nail dystrophy: Secondary | ICD-10-CM | POA: Diagnosis not present

## 2020-06-27 NOTE — Progress Notes (Signed)
Subjective:  Patient ID: Andrea Holden, female    DOB: [birth year],  MRN: [id number]  Chief Complaint  Patient presents with   Nail Problem    Nail trim RFC, ingrown toenails bilat hallux    74 y.o. female presents with the above complaint. Patient presents with complaint of bilateral hallux ingrown.  Patient complains of ingrown to the right hallux lateral border and left hallux medial border.  Is painful to touch she would like to have removed.  She states it is growing and more starting her with ambulation.  She has not tried anything else.  She denies any other acute complaints.  She has not seen anyone else for this acute complaint.   Review of Systems: Negative except as noted in the HPI. Denies N/V/F/Ch.  Past Medical History:  Diagnosis Date   Allergy    seasonal allergies   Anemia 2014   needed 5 units of blood d/t passing out, weak   Anxiety    Aortic stenosis 12/24/2017   Echo Aug 2018   Arthritis    Asthma    allergy induced asthma   Cardiac murmur 12/12/2017   Cataract    Cataract    left   Complication of anesthesia    arrhythmia following colonoscopy   Degenerative disc disease, lumbar    Degenerative disc disease, lumbar    Depression    Diabetes mellitus    Diabetic neuropathy ([place]) 11/16/2015   Dysrhythmia    stenosis of left ventricle   GERD (gastroesophageal reflux disease)    H/O transfusion    patient was given 5 units of blood while at [employer], blood type O+   History of chicken pox    History of hiatal hernia    History of measles, mumps, or rubella    HOH (hard of hearing)    does not use hearing aides yet   Hyperlipidemia    Hypertension    Irregular heartbeat    LVH (left ventricular hypertrophy) 12/24/2017   Echo Aug 2018   Neuropathy    Opiate use 11/16/2015   Peripheral arterial disease ([place]) 05/06/2018   At rest, left > right; refer to vasc   Pes planus of both feet 11/16/2015   Plantar fasciitis of  right foot 11/16/2015   Pneumonia    Wheezing     Current Outpatient Medications:    acetaminophen (TYLENOL) 325 MG tablet, Take 2 tablets (650 mg total) by mouth every 6 (six) hours as needed for mild pain, moderate pain, headache or fever., Disp: 30 tablet, Rfl: 0   albuterol (VENTOLIN HFA) 108 (90 Base) MCG/ACT inhaler, INHALE 2 PUFFS INTO THE LUNGS EVERY 6 HOURS AS NEEDED FOR WHEEZING OR SHORTNESS OF BREATH, Disp: 8.5 g, Rfl: 1   atorvastatin (LIPITOR) 10 MG tablet, Take 1 tablet (10 mg total) by mouth at bedtime., Disp: 90 tablet, Rfl: 3   Cholecalciferol (VITAMIN D3) 250 MCG (10000 UT) TABS, Take 10,000 Units by mouth daily. , Disp: , Rfl:    clopidogrel (PLAVIX) 75 MG tablet, TAKE 1 TABLET BY MOUTH ONCE A DAY, Disp: 90 tablet, Rfl: 0   fluticasone (FLONASE) 50 MCG/ACT nasal spray, Place 2 sprays into both nostrils daily as needed., Disp: 16 g, Rfl: 4   gabapentin (NEURONTIN) 400 MG capsule, Take 1 capsule (400 mg total) by mouth 2 (two) times daily., Disp: 180 capsule, Rfl: 1   ipratropium-albuterol (DUONEB) 0.5-2.5 (3) MG/3ML SOLN, Take 3 mLs by nebulization 3 (three) times daily as needed. (  Patient taking differently: Take 3 mLs by nebulization 3 (three) times daily as needed (Shortness of breath). ), Disp: 180 mL, Rfl: 1   lisinopril (ZESTRIL) 10 MG tablet, Take 1 tablet (10 mg total) by mouth daily., Disp: 90 tablet, Rfl: 3   montelukast (SINGULAIR) 10 MG tablet, Take 1 tablet (10 mg total) by mouth at bedtime., Disp: 90 tablet, Rfl: 3   Multiple Vitamins-Minerals (MULTIVITAMIN ADULTS 50+) TABS, Take 1 tablet by mouth daily., Disp: 90 tablet, Rfl: 1   pantoprazole (PROTONIX) 20 MG tablet, Take 1 tablet (20 mg total) by mouth daily., Disp: 90 tablet, Rfl: 1   Respiratory Therapy Supplies (NEBULIZER/TUBING/MOUTHPIECE) KIT, Disp one nebulizer machine, tubing set and mouthpiece kit (Patient not taking: Reported on 05/31/2020), Disp: 1 kit, Rfl: 0   sertraline (ZOLOFT) 100 MG  tablet, TAKE 1 AND 1/2 TABLETS (150 MG TOTAL) BY MOUTH DAILY (Patient taking differently: Take 100 mg by mouth daily. ), Disp: 135 tablet, Rfl: 3   umeclidinium-vilanterol (ANORO ELLIPTA) 62.5-25 MCG/INH AEPB, Inhale 1 puff into the lungs daily., Disp: 60 each, Rfl: 11   varenicline (CHANTIX) 1 MG tablet, , Disp: , Rfl:    vitamin C (ASCORBIC ACID) 500 MG tablet, Take 1,000 mg by mouth every other day. , Disp: , Rfl:    zinc gluconate 50 MG tablet, Take 50 mg by mouth daily., Disp: , Rfl:   Social History   Tobacco Use  Smoking Status Current Some Day Smoker   Packs/day: 0.50   Years: 55.00   Pack years: 27.50   Types: Cigarettes   Start date: 07/01/1960   Last attempt to quit: 11/22/2018   Years since quitting: 1.5  Smokeless Tobacco Never Used  Tobacco Comment   trying to quit again; wants to use chantix but unable to afford    Allergies  Allergen Reactions   Meloxicam Other (See Comments)    GI bleeding   Nsaids Other (See Comments)    Gi bleeding   Sitagliptin Hives and Itching    Januvia    Objective:  There were no vitals filed for this visit. There is no height or weight on file to calculate BMI. Constitutional Well developed. Well nourished.  Vascular Dorsalis pedis pulses palpable bilaterally. Posterior tibial pulses palpable bilaterally. Capillary refill normal to all digits.  No cyanosis or clubbing noted. Pedal hair growth normal.  Neurologic Normal speech. Oriented to person, place, and time. Epicritic sensation to light touch grossly present bilaterally.  Dermatologic Painful ingrowing nail at Left medial border and right lateral border nail borders of the hallux nail bilaterally. No other open wounds. No skin lesions.  Orthopedic: Normal joint ROM without pain or crepitus bilaterally. No visible deformities. No bony tenderness.   Radiographs: None Assessment:   1. Nail dystrophy   2. Ingrown toenail of right foot   3. Ingrown left big  toenail    Plan:  Patient was evaluated and treated and all questions answered.  Ingrown Nail, bilaterally with underlying nail dystrophy -Patient elects to proceed with minor surgery to remove ingrown toenail removal today. Consent reviewed and signed by patient. -Ingrown nail excised. See procedure note. -Educated on post-procedure care including soaking. Written instructions provided and reviewed. -Patient to follow up in 2 weeks for nail check.  Procedure: Excision of Ingrown Toenail Location: Bilateral 1st toe Left medial border right lateral border nail borders. Anesthesia: Lidocaine 1% plain; 1.5 mL and Marcaine 0.5% plain; 1.5 mL, digital block. Skin Prep: Betadine. Dressing: Silvadene; telfa; dry, sterile,  compression dressing. Technique: Following skin prep, the toe was exsanguinated and a tourniquet was secured at the base of the toe. The affected nail border was freed, split with a nail splitter, and excised. Chemical matrixectomy was then performed with phenol and irrigated out with alcohol. The tourniquet was then removed and sterile dressing applied. Disposition: Patient tolerated procedure well. Patient to return in 2 weeks for follow-up.   No follow-ups on file.

## 2020-07-11 ENCOUNTER — Telehealth: Payer: Self-pay

## 2020-07-11 NOTE — Chronic Care Management (AMB) (Addendum)
Chronic Care Management Pharmacy Assistant   Name: Andrea Holden  MRN: [id number] DOB: [birth year]  Reason for Encounter: Disease State / Hypertension and Lipids Adherence Call.  Patient Questions:   1.  Have you seen any other providers since your last visit? Yes 04/28/2020 Andrea Holden (Cardiology)  05/31/2020 Andrea Holden ,PA-C Family Medicine - Labs    2.  Any changes in your medicines or health? No     PCP : Patient, No Pcp Per  Allergies:   Allergies  Allergen Reactions   Meloxicam Other (See Comments)    GI bleeding   Nsaids Other (See Comments)    Gi bleeding   Sitagliptin Hives and Itching    Januvia     Medications: Outpatient Encounter Medications as of 07/11/2020  Medication Sig Note   acetaminophen (TYLENOL) 325 MG tablet Take 2 tablets (650 mg total) by mouth every 6 (six) hours as needed for mild pain, moderate pain, headache or fever.    albuterol (VENTOLIN HFA) 108 (90 Base) MCG/ACT inhaler INHALE 2 PUFFS INTO THE LUNGS EVERY 6 HOURS AS NEEDED FOR WHEEZING OR SHORTNESS OF BREATH    atorvastatin (LIPITOR) 10 MG tablet Take 1 tablet (10 mg total) by mouth at bedtime.    Cholecalciferol (VITAMIN D3) 250 MCG (10000 UT) TABS Take 10,000 Units by mouth daily.     clopidogrel (PLAVIX) 75 MG tablet TAKE 1 TABLET BY MOUTH ONCE A DAY    fluticasone (FLONASE) 50 MCG/ACT nasal spray Place 2 sprays into both nostrils daily as needed. 01/06/2020: PRN allergies   gabapentin (NEURONTIN) 400 MG capsule Take 1 capsule (400 mg total) by mouth 2 (two) times daily.    ipratropium-albuterol (DUONEB) 0.5-2.5 (3) MG/3ML SOLN Take 3 mLs by nebulization 3 (three) times daily as needed. (Patient taking differently: Take 3 mLs by nebulization 3 (three) times daily as needed (Shortness of breath). )    lisinopril (ZESTRIL) 10 MG tablet Take 1 tablet (10 mg total) by mouth daily.    montelukast (SINGULAIR) 10 MG tablet Take 1 tablet (10 mg total) by mouth at bedtime.     Multiple Vitamins-Minerals (MULTIVITAMIN ADULTS 50+) TABS Take 1 tablet by mouth daily.    pantoprazole (PROTONIX) 20 MG tablet Take 1 tablet (20 mg total) by mouth daily.    Respiratory Therapy Supplies (NEBULIZER/TUBING/MOUTHPIECE) KIT Disp one nebulizer machine, tubing set and mouthpiece kit (Patient not taking: Reported on 05/31/2020)    sertraline (ZOLOFT) 100 MG tablet TAKE 1 AND 1/2 TABLETS (150 MG TOTAL) BY MOUTH DAILY (Patient taking differently: Take 100 mg by mouth daily. )    umeclidinium-vilanterol (ANORO ELLIPTA) 62.5-25 MCG/INH AEPB Inhale 1 puff into the lungs daily.    varenicline (CHANTIX) 1 MG tablet     vitamin C (ASCORBIC ACID) 500 MG tablet Take 1,000 mg by mouth every other day.     zinc gluconate 50 MG tablet Take 50 mg by mouth daily.    No facility-administered encounter medications on file as of 07/11/2020.    Current Diagnosis: Patient Active Problem List   Diagnosis Date Noted   Senile purpura ([place]) 05/31/2020   Enrolled in chronic care management 01/12/2020   Rectal bleeding 12/26/2019   Colitis 12/26/2019   Nicotine dependence 06/15/2019   Osteopenia 03/03/2019   Lower extremity edema 07/31/2018   Pressure injury of skin 07/24/2018   Surgical site infection 07/23/2018   Wound of left leg 07/23/2018   Atherosclerosis of artery of extremity with rest pain ([place])  07/08/2018   Shortness of breath 06/02/2018   Bruit 06/02/2018   Coronary artery disease of native artery of native heart with stable angina pectoris ([place]) 06/02/2018   Atherosclerosis of native arteries of extremity with intermittent claudication ([place]) 05/19/2018   Peripheral arterial disease ([place]) 05/06/2018   Personal history of colonic polyps    Aortic stenosis 12/24/2017   LVH (left ventricular hypertrophy) 12/24/2017   Hypertension with heart disease 12/24/2017   Left atrial dilatation 12/12/2017   Cardiac murmur 12/12/2017   Gastrojejunal ulcer 12/04/2017   Hyperphosphatemia 12/04/2017    Spinal stenosis of lumbar region 06/18/2017   Degeneration of lumbar intervertebral disc 06/02/2017   Assistance needed with transportation 05/26/2017   Financial difficulties 05/26/2017   Needs assistance with community resources 05/26/2017   Moderate recurrent major depression ([place]) 05/26/2017   Non-healing wound of lower extremity 02/06/2017   Status post ankle arthrodesis 12/11/2016   Controlled substance agreement broken 10/11/2016   Low back pain 09/25/2016   Osteoarthritis of right subtalar joint 07/11/2016   Posterior tibial tendinitis of right lower extremity 07/11/2016   Breast cancer screening 06/18/2016   Knee pain 04/03/2016   Hyponatremia 03/01/2016   High triglycerides 12/24/2015   Pes planus of both feet 11/16/2015   Plantar fasciitis of right foot 11/16/2015   Heel spur 11/16/2015   Diabetic neuropathy ([place]) 11/16/2015   Right ankle pain 11/16/2015   Medication monitoring encounter 11/16/2015   Diabetes mellitus without complication ([place]) 83/03/4075   Chronic pain of multiple joints 08/15/2015   Abnormal mammogram of right breast 06/19/2015   Cerumen impaction 03/16/2015   Hyperlipidemia 12/14/2014   History of transfusion of packed RBC 12/14/2014   Hx of smoking 12/14/2014   Status post bariatric surgery 12/14/2014   Morbid obesity ([place]) 12/14/2014   Chronic radicular lumbar pain 12/14/2014   History of GI bleed 11/12/2014   History of small bowel obstruction 09/07/2014   Reviewed chart prior to disease state call. Spoke with patient regarding BP  Recent Office Vitals: BP Readings from Last 3 Encounters:  05/31/20 130/77  04/28/20 (!) 100/54  12/31/19 (!) 146/64   Pulse Readings from Last 3 Encounters:  05/31/20 77  04/28/20 68  12/31/19 73    Wt Readings from Last 3 Encounters:  05/31/20 161 lb 1.6 oz (73.1 kg)  04/28/20 168 lb (76.2 kg)  12/26/19 185 lb (83.9 kg)     Kidney Function Lab Results  Component Value Date/Time   CREATININE 0.76  12/31/2019 06:51 AM   CREATININE 0.91 12/30/2019 04:39 AM   CREATININE 0.81 11/30/2019 12:40 PM   CREATININE 0.78 06/03/2019 12:00 AM   GFRNONAA >60 12/31/2019 06:51 AM   GFRNONAA 73 11/30/2019 12:40 PM   GFRAA >60 12/31/2019 06:51 AM   GFRAA 84 11/30/2019 12:40 PM    BMP Latest Ref Rng & Units 12/31/2019 12/30/2019 12/29/2019  Glucose 70 - 99 mg/dL 122(H) 144(H) 97  BUN 8 - 23 mg/dL _0 Creatinine 0.44 - 1.00 mg/dL 0.76 0.91 0.77  BUN/Creat Ratio 6 - 22 (calc) - - -  Sodium 135 - 145 mmol/L 134(L) 132(L) 127(L)  Potassium 3.5 - 5.1 mmol/L 4.8 4.3 4.4  Chloride 98 - 111 mmol/L 103 101 96(L)  CO2 22 - 32 mmol/L _1 Calcium 8.9 - 10.3 mg/dL 8.7(L) 8.4(L) 8.3(L)   Current antihypertensive regimen:  Lisinopril 10 mg one a day  How often are you checking your Blood Pressure? Patient states she only checks when symptomatic .  Current home BP readings: None  What recent interventions/DTPs have been made by any provider to improve Blood Pressure control since last CPP Visit: Patient states she has been taking her medication as directed.  Any recent hospitalizations or ED visits since last visit with CPP? No  What diet changes have been made to improve Blood Pressure Control?  Patient states she does watch her diet ,she is required to eat salt.  What exercise is being done to improve your Blood Pressure Control?  Patient states she is very active / just moved.   Adherence Review: Is the patient currently on ACE/ARB medication? Yes  Does the patient have >5 day gap between last estimated fill dates? No   Comprehensive medication review performed; Spoke to patient regarding cholesterol  Lipid Panel    Component Value Date/Time   CHOL 134 11/30/2019 1240   CHOL 182 11/21/2015 1416   TRIG 112 11/30/2019 1240   HDL 46 (L) 11/30/2019 1240   HDL 44 11/21/2015 1416   LDLCALC 68 11/30/2019 1240    10-year ASCVD risk score: The 10-year ASCVD risk score Mikey Bussing DC Brooke Bonito., et  al., 2013) is: 42.1%   Values used to calculate the score:     Age: 75 years     Sex: Female     Is Non-Hispanic African American: No     Diabetic: Yes     Tobacco smoker: Yes     Systolic Blood Pressure: 341 mmHg     Is BP treated: Yes     HDL Cholesterol: 46 mg/dL     Total Cholesterol: 134 mg/dL  Current antihyperlipidemic regimen:  Atorvastatin 10 mg one a day   Previous antihyperlipidemic medications tried: None  ASCVD risk enhancing conditions: age >71 and HTN   What recent interventions/DTPs have been made by any provider to improve Cholesterol control since last CPP Visit:  Patient states she has been taking her medication as directed.  Any recent hospitalizations or ED visits since last visit with CPP? No  What diet changes have been made to improve Cholesterol?  Patient states she does watch her diet.  What exercise is being done to improve Cholesterol?  Patient states she is active.   Adherence Review: Does the patient have >5 day gap between last estimated fill dates? No     Follow-Up:  Pharmacist Review   Daron Offer CPP Notified  Judithann Sheen, [facility] Pharmacist Assistant [phone]  Addendum 07/24/20: Patient no longer a [facility] patient. Disenrolled patient from services. Please place referral if additional pharmacy support requested.  10 minutes spent in review, coordination, and documentation.  [facility] [phone]

## 2020-07-19 ENCOUNTER — Other Ambulatory Visit: Payer: Self-pay | Admitting: Cardiovascular Disease

## 2020-07-19 NOTE — Telephone Encounter (Signed)
Rx request sent to pharmacy.  

## 2020-09-06 ENCOUNTER — Emergency Department: Payer: Medicare HMO

## 2020-09-06 ENCOUNTER — Emergency Department
Admission: EM | Admit: 2020-09-06 | Discharge: 2020-09-06 | Disposition: A | Discharge status: Home / Self Care | Payer: Medicare HMO | Attending: Emergency Medicine | Admitting: Emergency Medicine

## 2020-09-06 ENCOUNTER — Other Ambulatory Visit: Payer: Self-pay

## 2020-09-06 ENCOUNTER — Encounter: Payer: Self-pay | Admitting: *Deleted

## 2020-09-06 DIAGNOSIS — E1136 Type 2 diabetes mellitus with diabetic cataract: Secondary | ICD-10-CM | POA: Diagnosis not present

## 2020-09-06 DIAGNOSIS — Z7951 Long term (current) use of inhaled steroids: Secondary | ICD-10-CM | POA: Diagnosis not present

## 2020-09-06 DIAGNOSIS — W01198A Fall on same level from slipping, tripping and stumbling with subsequent striking against other object, initial encounter: Secondary | ICD-10-CM | POA: Diagnosis not present

## 2020-09-06 DIAGNOSIS — Z7902 Long term (current) use of antithrombotics/antiplatelets: Secondary | ICD-10-CM | POA: Insufficient documentation

## 2020-09-06 DIAGNOSIS — J45909 Unspecified asthma, uncomplicated: Secondary | ICD-10-CM | POA: Diagnosis not present

## 2020-09-06 DIAGNOSIS — E1151 Type 2 diabetes mellitus with diabetic peripheral angiopathy without gangrene: Secondary | ICD-10-CM | POA: Diagnosis not present

## 2020-09-06 DIAGNOSIS — S60221A Contusion of right hand, initial encounter: Secondary | ICD-10-CM | POA: Diagnosis not present

## 2020-09-06 DIAGNOSIS — I10 Essential (primary) hypertension: Secondary | ICD-10-CM | POA: Insufficient documentation

## 2020-09-06 DIAGNOSIS — E1140 Type 2 diabetes mellitus with diabetic neuropathy, unspecified: Secondary | ICD-10-CM | POA: Insufficient documentation

## 2020-09-06 DIAGNOSIS — S6000XA Contusion of unspecified finger without damage to nail, initial encounter: Secondary | ICD-10-CM

## 2020-09-06 DIAGNOSIS — E785 Hyperlipidemia, unspecified: Secondary | ICD-10-CM | POA: Insufficient documentation

## 2020-09-06 DIAGNOSIS — S299XXA Unspecified injury of thorax, initial encounter: Secondary | ICD-10-CM | POA: Diagnosis present

## 2020-09-06 DIAGNOSIS — Y92017 Garden or yard in single-family (private) house as the place of occurrence of the external cause: Secondary | ICD-10-CM | POA: Insufficient documentation

## 2020-09-06 DIAGNOSIS — E1169 Type 2 diabetes mellitus with other specified complication: Secondary | ICD-10-CM | POA: Diagnosis not present

## 2020-09-06 DIAGNOSIS — S20211A Contusion of right front wall of thorax, initial encounter: Secondary | ICD-10-CM | POA: Insufficient documentation

## 2020-09-06 DIAGNOSIS — Y92009 Unspecified place in unspecified non-institutional (private) residence as the place of occurrence of the external cause: Secondary | ICD-10-CM

## 2020-09-06 DIAGNOSIS — I25118 Atherosclerotic heart disease of native coronary artery with other forms of angina pectoris: Secondary | ICD-10-CM | POA: Diagnosis not present

## 2020-09-06 MED ORDER — IPRATROPIUM-ALBUTEROL 0.5-2.5 (3) MG/3ML IN SOLN: 3.0000 mL | Freq: Three times a day (TID) | RESPIRATORY_TRACT | 0 refills | Status: DC | PRN

## 2020-09-06 MED ORDER — ACETAMINOPHEN 325 MG PO TABS
650.0000 mg | ORAL_TABLET | Freq: Once | ORAL | Status: AC
  Administered 2020-09-06: 650 mg via ORAL
  Filled 2020-09-06: qty 2

## 2020-09-06 NOTE — ED Provider Notes (Signed)
[facility] Emergency Department Andrea Holden Note ____________________________________________  Time seen: 18  I have reviewed the triage vital signs and the nursing notes.  HISTORY  Chief Complaint  Fall  HPI Andrea Holden is a 75 y.o. female with the below medical history, reports for evaluation of injuries sustained following a mechanical fall. She describes tripping over a tree root in the yard, landing on her left side. She treated at home with ice and Tylenol. She presents with right chest wall pain and left hand pain and bruising. She denies a head injury or LOC. She has COPD, and has been without her inhalers, as she just relocated and is still upacking.   Past Medical History:  Diagnosis Date  . Allergy    seasonal allergies  . Anemia 2014   needed 5 units of blood d/t passing out, weak  . Anxiety   . Aortic stenosis 12/24/2017   Echo Aug 2018  . Arthritis   . Asthma    allergy induced asthma  . Cardiac murmur 12/12/2017  . Cataract   . Cataract    left  . Complication of anesthesia    arrhythmia following colonoscopy  . Degenerative disc disease, lumbar   . Degenerative disc disease, lumbar   . Depression   . Diabetes mellitus   . Diabetic neuropathy ([place]) 11/16/2015  . Dysrhythmia    stenosis of left ventricle  . GERD (gastroesophageal reflux disease)   . H/O transfusion    patient was given 5 units of blood while at [place], blood type O+  . History of chicken pox   . History of hiatal hernia   . History of measles, mumps, or rubella   . HOH (hard of hearing)    does not use hearing aides yet  . Hyperlipidemia   . Hypertension   . Irregular heartbeat   . LVH (left ventricular hypertrophy) 12/24/2017   Echo Aug 2018  . Neuropathy   . Opiate use 11/16/2015  . Peripheral arterial disease ([place]) 05/06/2018   At rest, left > right; refer to vasc  . Pes planus of both feet 11/16/2015  . Plantar fasciitis of right foot 11/16/2015  .  Pneumonia   . Wheezing     Patient Active Problem List   Diagnosis Date Noted  . Senile purpura ([place]) 05/31/2020  . Enrolled in chronic care management 01/12/2020  . Rectal bleeding 12/26/2019  . Colitis 12/26/2019  . Nicotine dependence 06/15/2019  . Osteopenia 03/03/2019  . Lower extremity edema 07/31/2018  . Pressure injury of skin 07/24/2018  . Surgical site infection 07/23/2018  . Wound of left leg 07/23/2018  . Atherosclerosis of artery of extremity with rest pain ([place]) 07/08/2018  . Shortness of breath 06/02/2018  . Bruit 06/02/2018  . Coronary artery disease of native artery of native heart with stable angina pectoris ([place]) 06/02/2018  . Atherosclerosis of native arteries of extremity with intermittent claudication ([place]) 05/19/2018  . Peripheral arterial disease ([place]) 05/06/2018  . Personal history of colonic polyps   . Aortic stenosis 12/24/2017  . LVH (left ventricular hypertrophy) 12/24/2017  . Hypertension with heart disease 12/24/2017  . Left atrial dilatation 12/12/2017  . Cardiac murmur 12/12/2017  . Gastrojejunal ulcer 12/04/2017  . Hyperphosphatemia 12/04/2017  . Spinal stenosis of lumbar region 06/18/2017  . Degeneration of lumbar intervertebral disc 06/02/2017  . Assistance needed with transportation 05/26/2017  . Financial difficulties 05/26/2017  . Needs assistance with community resources 05/26/2017  . Moderate recurrent major  depression ([place]) 05/26/2017  . Non-healing wound of lower extremity 02/06/2017  . Status post ankle arthrodesis 12/11/2016  . Controlled substance agreement broken 10/11/2016  . Low back pain 09/25/2016  . Osteoarthritis of right subtalar joint 07/11/2016  . Posterior tibial tendinitis of right lower extremity 07/11/2016  . Breast cancer screening 06/18/2016  . Knee pain 04/03/2016  . Hyponatremia 03/01/2016  . High triglycerides 12/24/2015  . Pes planus of both feet 11/16/2015  . Plantar fasciitis of right foot 11/16/2015   . Heel spur 11/16/2015  . Diabetic neuropathy ([place]) 11/16/2015  . Right ankle pain 11/16/2015  . Medication monitoring encounter 11/16/2015  . Diabetes mellitus without complication ([place]) 66/11/3014  . Chronic pain of multiple joints 08/15/2015  . Abnormal mammogram of right breast 06/19/2015  . Cerumen impaction 03/16/2015  . Hyperlipidemia 12/14/2014  . History of transfusion of packed RBC 12/14/2014  . Hx of smoking 12/14/2014  . Status post bariatric surgery 12/14/2014  . Morbid obesity ([place]) 12/14/2014  . Chronic radicular lumbar pain 12/14/2014  . History of GI bleed 11/12/2014  . History of small bowel obstruction 09/07/2014    Past Surgical History:  Procedure Laterality Date  . ABDOMINAL HYSTERECTOMY    . APPLICATION OF WOUND VAC Left 07/23/2018   Procedure: APPLICATION OF WOUND VAC;  Surgeon: Algernon Huxley, MD;  Location: [facility] ORS;  Service: Vascular;  Laterality: Left;  . banck injections    . CATARACT EXTRACTION W/PHACO Right 05/30/2015   Procedure: CATARACT EXTRACTION PHACO AND INTRAOCULAR LENS PLACEMENT (IOC);  Surgeon: Birder Robson, MD;  Location: [facility] ORS;  Service: Ophthalmology;  Laterality: Right;  [place] 00:57 AP% 20.9 CDE 11.99 fluid pack lot #[id number]  . CATARACT EXTRACTION W/PHACO Left 11/11/2019   Procedure: CATARACT EXTRACTION PHACO AND INTRAOCULAR LENS PLACEMENT ([place]) LEFT DIABETIC;  Surgeon: Birder Robson, MD;  Location: [facility] ORS;  Service: Ophthalmology;  Laterality: Left;  Lot #[id number] H [place]: 00:47.5 CDE: 6.24  . CHOLECYSTECTOMY  1970  . COLON SURGERY  2013   blocked colon  . COLONOSCOPY N/A 12/29/2019   Procedure: COLONOSCOPY;  Surgeon: Virgel Manifold, MD;  Location: [facility] ENDOSCOPY;  Service: Endoscopy;  Laterality: N/A;  . COLONOSCOPY WITH PROPOFOL N/A 01/20/2018   Procedure: COLONOSCOPY WITH PROPOFOL;  Surgeon: Lucilla Lame, MD;  Location: [facility] ENDOSCOPY;  Service: Endoscopy;  Laterality: N/A;  . ENDARTERECTOMY FEMORAL Left 07/08/2018    Procedure: ENDARTERECTOMY FEMORAL;  Surgeon: Algernon Huxley, MD;  Location: [facility] ORS;  Service: Vascular;  Laterality: Left;  . EYE SURGERY Right    cataract surgery  . FOOT FUSION Right 2018   metal in foot  . GALLBLADDER SURGERY  1970  . GASTRIC BYPASS  2010   lost 178 lbs and regained 40 lbs last few years  . HUMERUS FRACTURE SURGERY Right    metal plate with screws  . internal bleeding  2016   ulcer in past  . LOWER EXTREMITY ANGIOGRAM Left 07/08/2018   Procedure: LOWER EXTREMITY ANGIOGRAM;  Surgeon: Algernon Huxley, MD;  Location: [facility] ORS;  Service: Vascular;  Laterality: Left;  . LOWER EXTREMITY ANGIOGRAPHY Left 05/27/2018   Procedure: LOWER EXTREMITY ANGIOGRAPHY;  Surgeon: Algernon Huxley, MD;  Location: [facility];  Service: Cardiovascular;  Laterality: Left;  . LOWER EXTREMITY ANGIOGRAPHY Right 12/07/2018   Procedure: LOWER EXTREMITY ANGIOGRAPHY;  Surgeon: Algernon Huxley, MD;  Location: [facility];  Service: Cardiovascular;  Laterality: Right;  . LOWER EXTREMITY ANGIOGRAPHY Left 05/10/2019   Procedure: LOWER EXTREMITY ANGIOGRAPHY;  Surgeon: Algernon Huxley, MD;  Location: [facility];  Service: Cardiovascular;  Laterality: Left;  . MOUTH SURGERY     root canals and crowns and extractions  . MOUTH SURGERY  09/30/2019  . OVARY SURGERY    . SKIN GRAFT Right 2018   RT foot. foot has been rebuilt.  it is full of metal  . TENOTOMY ACHILLES TENDON Right    Percuntaneous. metal in foot  . TONSILLECTOMY    . VISCERAL ANGIOGRAPHY N/A 12/31/2019   Procedure: VISCERAL ANGIOGRAPHY;  Surgeon: Algernon Huxley, MD;  Location: [place] [facility];  Service: Cardiovascular;  Laterality: N/A;  . WOUND DEBRIDEMENT Left 07/23/2018   Procedure: DEBRIDEMENT WOUND;  Surgeon: Algernon Huxley, MD;  Location: [facility] ORS;  Service: Vascular;  Laterality: Left;    Prior to Admission medications   Medication Sig Start Date End Date Taking? Authorizing Tilden Broz  ipratropium-albuterol (DUONEB)  0.5-2.5 (3) MG/3ML SOLN Take 3 mLs by nebulization 3 (three) times daily as needed. 09/06/20 10/16/20 Yes , Dannielle Karvonen, PA-C  acetaminophen (TYLENOL) 325 MG tablet Take 2 tablets (650 mg total) by mouth every 6 (six) hours as needed for mild pain, moderate pain, headache or fever. 12/31/19   Vashti Hey, MD  albuterol (VENTOLIN HFA) 108 (90 Base) MCG/ACT inhaler INHALE 2 PUFFS INTO THE LUNGS EVERY 6 HOURS AS NEEDED FOR WHEEZING OR SHORTNESS OF BREATH 04/04/20   [name], PA-C  atorvastatin (LIPITOR) 10 MG tablet Take 1 tablet (10 mg total) by mouth at bedtime. 08/24/19   [name], PA-C  Cholecalciferol (VITAMIN D3) 250 MCG (10000 UT) TABS Take 10,000 Units by mouth daily.     Iasiah Ozment, Historical, MD  clopidogrel (PLAVIX) 75 MG tablet TAKE 1 TABLET BY MOUTH ONCE A DAY 07/19/20   Minna Merritts, MD  fluticasone (FLONASE) 50 MCG/ACT nasal spray Place 2 sprays into both nostrils daily as needed. 08/24/19   [name], PA-C  gabapentin (NEURONTIN) 400 MG capsule Take 1 capsule (400 mg total) by mouth 2 (two) times daily. 05/31/20   [name], PA-C  ipratropium-albuterol (DUONEB) 0.5-2.5 (3) MG/3ML SOLN Take 3 mLs by nebulization 3 (three) times daily as needed. Patient taking differently: Take 3 mLs by nebulization 3 (three) times daily as needed (Shortness of breath).  05/24/19   [name], PA-C  lisinopril (ZESTRIL) 10 MG tablet Take 1 tablet (10 mg total) by mouth daily. 08/24/19   [name], PA-C  montelukast (SINGULAIR) 10 MG tablet Take 1 tablet (10 mg total) by mouth at bedtime. 08/24/19   [name], PA-C  Multiple Vitamins-Minerals (MULTIVITAMIN ADULTS 50+) TABS Take 1 tablet by mouth daily. 11/02/18   Poulose, Bethel Born, NP  pantoprazole (PROTONIX) 20 MG tablet Take 1 tablet (20 mg total) by mouth daily. 05/31/20   [name], PA-C  Respiratory Therapy Supplies (NEBULIZER/TUBING/MOUTHPIECE) KIT Disp one nebulizer machine, tubing set and mouthpiece  kit Patient not taking: Reported on 05/31/2020 05/24/19   [name], PA-C  sertraline (ZOLOFT) 100 MG tablet TAKE 1 AND 1/2 TABLETS (150 MG TOTAL) BY MOUTH DAILY Patient taking differently: Take 100 mg by mouth daily.  08/24/19   [name], PA-C  umeclidinium-vilanterol (ANORO ELLIPTA) 62.5-25 MCG/INH AEPB Inhale 1 puff into the lungs daily. 08/24/19   [name], PA-C  varenicline (CHANTIX) 1 MG tablet  06/01/20   Tiffinie Caillier, Historical, MD  vitamin C (ASCORBIC ACID) 500 MG tablet Take 1,000 mg by mouth every other day.  Katti Pelle, Historical, MD  zinc gluconate 50 MG tablet Take 50 mg by mouth daily.    Detra Bores, Historical, MD    Allergies Meloxicam, Nsaids, and Sitagliptin  Family History  Problem Relation Age of Onset  . Asthma Mother   . COPD Mother   . Arthritis Father   . Depression Father   . Heart disease Father   . Hypertension Father   . Stroke Father   . Heart attack Father   . Arthritis Brother   . Depression Brother   . Diabetes Brother   . Heart disease Brother   . Hyperlipidemia Brother   . Hypertension Brother   . Stroke Brother   . Vision loss Brother   . Heart attack Brother   . Diabetes Maternal Grandmother   . Thyroid disease Daughter   . Colitis Daughter   . Breast cancer Neg Hx     Social History Social History   Tobacco Use  . Smoking status: Current Some Day Smoker    Packs/day: 0.50    Years: 55.00    Pack years: 27.50    Types: Cigarettes    Start date: 07/01/1960    Last attempt to quit: 11/22/2018    Years since quitting: 1.7  . Smokeless tobacco: Never Used  . Tobacco comment: trying to quit again; wants to use chantix but unable to afford  Vaping Use  . Vaping Use: Never used  Substance Use Topics  . Alcohol use: Yes    Alcohol/week: 0.0 standard drinks    Comment: 2 drinks per month  . Drug use: Not Currently    Types: Marijuana    Comment: used for pain    Review of Systems  Constitutional: Negative for  fever. Cardiovascular: Negative for chest pain. Respiratory: Negative for shortness of breath. Reports right rib pain Gastrointestinal: Negative for abdominal pain, vomiting and diarrhea. Genitourinary: Negative for dysuria. Musculoskeletal: Negative for back pain. Reports left hand pain Skin: Negative for rash. Neurological: Negative for headaches, focal weakness or numbness. ____________________________________________  PHYSICAL EXAM:  VITAL SIGNS: ED Triage Vitals  Enc Vitals Group     BP 09/06/20 1815 (!) 183/64     Pulse Rate 09/06/20 1815 62     Resp 09/06/20 1815 20     Temp 09/06/20 1815 98 F (36.7 C)     Temp Source 09/06/20 1815 Oral     SpO2 09/06/20 1815 96 %     Weight 09/06/20 1813 145 lb (65.8 kg)     Height 09/06/20 1813 5' (1.524 m)     Head Circumference --      Peak Flow --      Pain Score 09/06/20 1813 5     Pain Loc --      Pain Edu? --      Excl. in [place]? --     Constitutional: Alert and oriented. Well appearing and in no distress. Head: Normocephalic and atraumatic. Eyes: Conjunctivae are normal. Normal extraocular movements Neck: Supple. Normal ROM Cardiovascular: Normal rate, regular rhythm. Normal distal pulses. Respiratory: Normal respiratory effort. No wheezes/rales. Rhonchi noted bilaterally. Normal, symmetric chest rise. No chest wall deformity.  Gastrointestinal: Soft and nontender. No distention. Musculoskeletal: Normal spinal alignment without midline tenderness, spasm, deformity, or step-off. Right hand with dorsal ecchymosis. Normal composite fist noted. Nontender with normal range of motion in all extremities.  Neurologic: CN II-XII grossly intact. Normal gait without ataxia. Normal speech and language. No gross focal neurologic deficits are appreciated. Skin:  Skin is  warm, dry and intact. No rash noted. ____________________________________________   RADIOLOGY  DG Right Hand IMPRESSION: Degenerative changes, without evidence of acute  osseous abnormality.  DG Right Rib w/ CXR IMPRESSION: No acute osseous abnormality.  ____________________________________________  PROCEDURES  Tylenol 650 mg PO  Procedures ____________________________________________  INITIAL IMPRESSION / ASSESSMENT AND PLAN / ED COURSE  Geriatric patient ED evaluation of injury sustained following a mechanical fall.  Patient presents with complaints primarily to the left hand and the right ribs.  She is presenting without signs of acute neurologic deficit, or acute fractures or dislocations.  No x-ray evidence of any underlying arthropathy.  Patient is reassured by her exam and negative x-rays.  She will be discharged with instructions to follow-up with her primary Terrina Docter for ongoing symptoms.  Courtesy prescription has been given for her DuoNeb solution.   Andrea Holden was evaluated in Emergency Department on 09/06/2020 for the symptoms described in the history of present illness. She was evaluated in the context of the global COVID-19 pandemic, which necessitated consideration that the patient might be at risk for infection with the SARS-CoV-2 virus that causes COVID-19. Institutional protocols and algorithms that pertain to the evaluation of patients at risk for COVID-19 are in a state of rapid change based on information released by regulatory bodies including the CDC and federal and state organizations. These policies and algorithms were followed during the patient's care in the ED. ____________________________________________  FINAL CLINICAL IMPRESSION(S) / ED DIAGNOSES  Final diagnoses:  Fall in home, initial encounter  Rib contusion, right, initial encounter  Contusion of finger of right hand, initial encounter      Melvenia Needles, PA-C 09/06/20 2327    Naaman Plummer, MD 09/11/20 [phone]

## 2020-09-06 NOTE — ED Notes (Signed)
ED Provider at bedside. 

## 2020-09-06 NOTE — ED Notes (Signed)
Patient assisted to and from restroom with wheelchair assistance. Patient gait steady with cane when walking. Patient then assisted back to bed without difficulty.

## 2020-09-06 NOTE — Discharge Instructions (Signed)
Your exam and X-rays are normal and there is no evidence of any fractures. Take OTC Tylenol as needed for pain. Follow-up with your provider for continued symptoms.

## 2020-09-06 NOTE — ED Triage Notes (Signed)
Pt to triage via wheelchair.  Pt tripped over a tree root 4 days ago and fell.  Pt has pain in left hand and right rib pain.  Pt on blood thinners.  No loc.  Pt did not hit head pt alert  Speech clear.

## 2020-09-06 NOTE — ED Notes (Signed)
Patient continues to be off the floor in radiology at this time.

## 2020-09-06 NOTE — ED Notes (Signed)
Patient updated on POC. Patient denies needs.

## 2020-09-06 NOTE — ED Notes (Signed)
Patient transported to X-ray 

## 2020-09-06 NOTE — ED Notes (Signed)
Patient is resting comfortably. 

## 2020-09-19 ENCOUNTER — Other Ambulatory Visit: Payer: Self-pay | Admitting: Family Medicine

## 2020-09-19 DIAGNOSIS — F3341 Major depressive disorder, recurrent, in partial remission: Secondary | ICD-10-CM

## 2020-10-03 ENCOUNTER — Ambulatory Visit (INDEPENDENT_AMBULATORY_CARE_PROVIDER_SITE_OTHER): Payer: Medicare HMO | Admitting: Podiatry

## 2020-10-03 ENCOUNTER — Ambulatory Visit: Payer: Medicare (Managed Care) | Admitting: Podiatry

## 2020-10-03 ENCOUNTER — Other Ambulatory Visit: Payer: Self-pay

## 2020-10-03 ENCOUNTER — Encounter: Payer: Self-pay | Admitting: Podiatry

## 2020-10-03 DIAGNOSIS — B351 Tinea unguium: Secondary | ICD-10-CM

## 2020-10-03 DIAGNOSIS — M79674 Pain in right toe(s): Secondary | ICD-10-CM

## 2020-10-03 DIAGNOSIS — M79675 Pain in left toe(s): Secondary | ICD-10-CM

## 2020-10-04 ENCOUNTER — Encounter: Payer: Self-pay | Admitting: Podiatry

## 2020-10-04 NOTE — Progress Notes (Signed)
  Subjective:  Patient ID: Andrea Holden, female    DOB: 1946/03/21,  MRN: [id number]  Chief Complaint  Patient presents with  . Nail Problem  . Ankle Pain    Nail trim RFC   75 y.o. female returns for the above complaint.  Patient presents with complaint of thickened elongated dystrophic mycotic toenails x10.  Patient states is mildly painful to touch.  She states it hurts when ambulating.  She is not able to do it herself.  She would like for me to take care of the nail.  She denies any other acute complaints.  She is not a diabetic.  Objective:  There were no vitals filed for this visit. Podiatric Exam: Vascular: dorsalis pedis and posterior tibial pulses are palpable bilateral. Capillary return is immediate. Temperature gradient is WNL. Skin turgor WNL  Sensorium: Normal Semmes Weinstein monofilament test. Normal tactile sensation bilaterally. Nail Exam: Pt has thick disfigured discolored nails with subungual debris noted bilateral entire nail hallux through fifth toenails.  Pain on palpation to the nails. Ulcer Exam: There is no evidence of ulcer or pre-ulcerative changes or infection. Orthopedic Exam: Muscle tone and strength are WNL. No limitations in general ROM. No crepitus or effusions noted. HAV  B/L.  Hammer toes 2-5  B/L. Skin: No Porokeratosis. No infection or ulcers    Assessment & Plan:   1. Pain due to onychomycosis of toenails of both feet     Patient was evaluated and treated and all questions answered.  Onychomycosis with pain  -Nails palliatively debrided as below. -Educated on self-care  Procedure: Nail Debridement Rationale: pain  Type of Debridement: manual, sharp debridement. Instrumentation: Nail nipper, rotary burr. Number of Nails: 10  Procedures and Treatment: Consent by patient was obtained for treatment procedures. The patient understood the discussion of treatment and procedures well. All questions were answered thoroughly reviewed.  Debridement of mycotic and hypertrophic toenails, 1 through 5 bilateral and clearing of subungual debris. No ulceration, no infection noted.  Return Visit-Office Procedure: Patient instructed to return to the office for a follow up visit 3 months for continued evaluation and treatment.  Nicholes Rough, DPM    No follow-ups on file.

## 2020-10-09 ENCOUNTER — Other Ambulatory Visit: Payer: Self-pay | Admitting: Family Medicine

## 2020-10-09 DIAGNOSIS — E782 Mixed hyperlipidemia: Secondary | ICD-10-CM

## 2020-10-09 DIAGNOSIS — F3341 Major depressive disorder, recurrent, in partial remission: Secondary | ICD-10-CM

## 2020-12-27 ENCOUNTER — Other Ambulatory Visit: Payer: Self-pay | Admitting: Family Medicine

## 2020-12-27 DIAGNOSIS — E1142 Type 2 diabetes mellitus with diabetic polyneuropathy: Secondary | ICD-10-CM

## 2021-01-08 ENCOUNTER — Other Ambulatory Visit: Payer: Self-pay | Admitting: Family Medicine

## 2021-01-08 ENCOUNTER — Other Ambulatory Visit: Payer: Self-pay | Admitting: Cardiovascular Disease

## 2021-01-08 DIAGNOSIS — K219 Gastro-esophageal reflux disease without esophagitis: Secondary | ICD-10-CM

## 2021-03-28 ENCOUNTER — Emergency Department
Admission: EM | Admit: 2021-03-28 | Discharge: 2021-03-28 | Disposition: A | Discharge status: Home / Self Care | Payer: Medicare Other | Attending: Emergency Medicine | Admitting: Emergency Medicine

## 2021-03-28 ENCOUNTER — Other Ambulatory Visit: Payer: Self-pay

## 2021-03-28 ENCOUNTER — Emergency Department: Payer: Medicare Other

## 2021-03-28 DIAGNOSIS — S0990XA Unspecified injury of head, initial encounter: Secondary | ICD-10-CM

## 2021-03-28 DIAGNOSIS — I25118 Atherosclerotic heart disease of native coronary artery with other forms of angina pectoris: Secondary | ICD-10-CM | POA: Diagnosis not present

## 2021-03-28 DIAGNOSIS — I10 Essential (primary) hypertension: Secondary | ICD-10-CM | POA: Diagnosis not present

## 2021-03-28 DIAGNOSIS — W01198A Fall on same level from slipping, tripping and stumbling with subsequent striking against other object, initial encounter: Secondary | ICD-10-CM | POA: Diagnosis not present

## 2021-03-28 DIAGNOSIS — T148XXA Other injury of unspecified body region, initial encounter: Secondary | ICD-10-CM

## 2021-03-28 DIAGNOSIS — S0083XA Contusion of other part of head, initial encounter: Secondary | ICD-10-CM | POA: Diagnosis not present

## 2021-03-28 DIAGNOSIS — Z79899 Other long term (current) drug therapy: Secondary | ICD-10-CM | POA: Insufficient documentation

## 2021-03-28 DIAGNOSIS — F17210 Nicotine dependence, cigarettes, uncomplicated: Secondary | ICD-10-CM | POA: Insufficient documentation

## 2021-03-28 DIAGNOSIS — Z7951 Long term (current) use of inhaled steroids: Secondary | ICD-10-CM | POA: Diagnosis not present

## 2021-03-28 DIAGNOSIS — Z7902 Long term (current) use of antithrombotics/antiplatelets: Secondary | ICD-10-CM | POA: Insufficient documentation

## 2021-03-28 DIAGNOSIS — J45909 Unspecified asthma, uncomplicated: Secondary | ICD-10-CM | POA: Insufficient documentation

## 2021-03-28 DIAGNOSIS — E1140 Type 2 diabetes mellitus with diabetic neuropathy, unspecified: Secondary | ICD-10-CM | POA: Diagnosis not present

## 2021-03-28 LAB — CBC
HCT: 35.3 % — ABNORMAL LOW (ref 36.0–46.0)
Hemoglobin: 12 g/dL (ref 12.0–15.0)
MCH: 28.2 pg (ref 26.0–34.0)
MCHC: 34 g/dL (ref 30.0–36.0)
MCV: 83.1 fL (ref 80.0–100.0)
Platelets: 170 10*3/uL (ref 150–400)
RBC: 4.25 MIL/uL (ref 3.87–5.11)
RDW: 16.5 % — ABNORMAL HIGH (ref 11.5–15.5)
WBC: 8.4 10*3/uL (ref 4.0–10.5)
nRBC: 0 % (ref 0.0–0.2)

## 2021-03-28 LAB — PROTIME-INR
INR: 0.9 (ref 0.8–1.2)
Prothrombin Time: 12.3 seconds (ref 11.4–15.2)

## 2021-03-28 NOTE — ED Notes (Signed)
See triage note  presents s/p fall  states she tripped and fell couple of days ago states she hit her head and has some discomfort to head and neck

## 2021-03-28 NOTE — ED Triage Notes (Signed)
Pt comes with c/o trip and fall on Sunday. Pt states she is on blood thinners. Pt states she hit head and her right eye. Pt has bruising noted to face and neck.

## 2021-03-28 NOTE — ED Provider Notes (Signed)
Emergency Medicine Provider Triage Evaluation Note  Andrea Holden , a 75 y.o. female  was evaluated in triage.  Pt complains of left sided headache, significant ecchymosis of the left face and neck.  Patient sustained a mechanical fall and struck her head.  No loss of consciousness.  Patient had a large hematoma and has had significant extension of ecchymosis spreading down the left side of the face and into the neck.  This also crosses the nose and this occurred yesterday.  No subsequent loss of consciousness.  Patient denies any other injury or complaint at this time..  Patient is currently on blood thinners  Review of Systems  Positive: Patient hit head with worsening ecchymosis Negative: No LOC, no visual changes, no radicular symptoms.  Physical Exam  There were no vitals taken for this visit. Gen:   Awake, no distress   Resp:  Normal effort  MSK:   Moves extremities without difficulty  Other:  Visualization of the left face reveals hematoma to the left temporal region significant ecchymosis extending from the left forehead through the face into the neck.  There is also extension across the midline into the right suborbital region.  Patient is tender with palpable edema about the left temporal region.  No crepitus.  No battle signs.  Positive raccoon eyes.  No serosanguineous fluid drainage from ears or nares  Medical Decision Making  Medically screening exam initiated at 5:02 PM.  Appropriate orders placed.  Andrea Holden was informed that the remainder of the evaluation will be completed by another provider, this initial triage assessment does not replace that evaluation, and the importance of remaining in the ED until their evaluation is complete.  Patient presented after falling and hitting her head.  Worsening ecchymosis.  Ongoing headache.  No concerning neuro deficits.  Patient will have imaging of the head, face, neck at this time.   Lanette Hampshire 03/28/21  1715    Minna Antis, MD 03/28/21 [phone]

## 2021-03-28 NOTE — ED Provider Notes (Signed)
[facility] Emergency Department Provider Note  Time seen: 7:18 PM  I have reviewed the triage vital signs and the nursing notes.   HISTORY  Chief Complaint Fall   HPI Andrea Holden is a 75 y.o. female with a past medical history of anemia, aortic stenosis, hypertension, hyperlipidemia, CAD, currently on Coumadin, presents emergency department for evaluation of a head injury.  According to the patient 3 days ago on Sunday morning she bent over to pick something up lost her balance and fell forwards hitting her head on the ground.  Patient denies LOC.  Patient states she got up and went to church.  Patient states she did not want to be evaluated but several of her coworkers wanted her to come be evaluated because of the increasing bruising to her face.  Patient states mild pain to her left forehead where she has a hematoma but bruising extending down to the lower face.  Denies any pain to the lower face.  No visual deficits.   Past Medical History:  Diagnosis Date   Allergy    seasonal allergies   Anemia 2014   needed 5 units of blood d/t passing out, weak   Anxiety    Aortic stenosis 12/24/2017   Echo Aug 2018   Arthritis    Asthma    allergy induced asthma   Cardiac murmur 12/12/2017   Cataract    Cataract    left   Complication of anesthesia    arrhythmia following colonoscopy   Degenerative disc disease, lumbar    Degenerative disc disease, lumbar    Depression    Diabetes mellitus    Diabetic neuropathy ([place]) 11/16/2015   Dysrhythmia    stenosis of left ventricle   GERD (gastroesophageal reflux disease)    H/O transfusion    patient was given 5 units of blood while at [place], blood type O+   History of chicken pox    History of hiatal hernia    History of measles, mumps, or rubella    HOH (hard of hearing)    does not use hearing aides yet   Hyperlipidemia    Hypertension    Irregular heartbeat    LVH (left ventricular hypertrophy)  12/24/2017   Echo Aug 2018   Neuropathy    Opiate use 11/16/2015   Peripheral arterial disease ([place]) 05/06/2018   At rest, left > right; refer to vasc   Pes planus of both feet 11/16/2015   Plantar fasciitis of right foot 11/16/2015   Pneumonia    Wheezing     Patient Active Problem List   Diagnosis Date Noted   Senile purpura ([place]) 05/31/2020   Enrolled in chronic care management 01/12/2020   Rectal bleeding 12/26/2019   Colitis 12/26/2019   Nicotine dependence 06/15/2019   Osteopenia 03/03/2019   Lower extremity edema 07/31/2018   Pressure injury of skin 07/24/2018   Surgical site infection 07/23/2018   Wound of left leg 07/23/2018   Atherosclerosis of artery of extremity with rest pain ([place]) 07/08/2018   Shortness of breath 06/02/2018   Bruit 06/02/2018   Coronary artery disease of native artery of native heart with stable angina pectoris ([place]) 06/02/2018   Atherosclerosis of native arteries of extremity with intermittent claudication ([place]) 05/19/2018   Peripheral arterial disease ([place]) 05/06/2018   Personal history of colonic polyps    Aortic stenosis 12/24/2017   LVH (left ventricular hypertrophy) 12/24/2017   Hypertension with heart disease 12/24/2017   Left atrial  dilatation 12/12/2017   Cardiac murmur 12/12/2017   Gastrojejunal ulcer 12/04/2017   Hyperphosphatemia 12/04/2017   Spinal stenosis of lumbar region 06/18/2017   Degeneration of lumbar intervertebral disc 06/02/2017   Assistance needed with transportation 05/26/2017   Financial difficulties 05/26/2017   Needs assistance with community resources 05/26/2017   Moderate recurrent major depression ([place]) 05/26/2017   Non-healing wound of lower extremity 02/06/2017   Status post ankle arthrodesis 12/11/2016   Controlled substance agreement broken 10/11/2016   Low back pain 09/25/2016   Osteoarthritis of right subtalar joint 07/11/2016   Posterior tibial tendinitis of right lower extremity 07/11/2016   Breast  cancer screening 06/18/2016   Knee pain 04/03/2016   Hyponatremia 03/01/2016   High triglycerides 12/24/2015   Pes planus of both feet 11/16/2015   Plantar fasciitis of right foot 11/16/2015   Heel spur 11/16/2015   Diabetic neuropathy ([place]) 11/16/2015   Right ankle pain 11/16/2015   Medication monitoring encounter 11/16/2015   Diabetes mellitus without complication ([place]) 19/14/7829   Chronic pain of multiple joints 08/15/2015   Abnormal mammogram of right breast 06/19/2015   Cerumen impaction 03/16/2015   Hyperlipidemia 12/14/2014   History of transfusion of packed RBC 12/14/2014   Hx of smoking 12/14/2014   Status post bariatric surgery 12/14/2014   Morbid obesity ([place]) 12/14/2014   Chronic radicular lumbar pain 12/14/2014   History of GI bleed 11/12/2014   History of small bowel obstruction 09/07/2014    Past Surgical History:  Procedure Laterality Date   ABDOMINAL HYSTERECTOMY     APPLICATION OF WOUND VAC Left 07/23/2018   Procedure: APPLICATION OF WOUND VAC;  Surgeon: Algernon Huxley, MD;  Location: [facility] ORS;  Service: Vascular;  Laterality: Left;   banck injections     CATARACT EXTRACTION W/PHACO Right 05/30/2015   Procedure: CATARACT EXTRACTION PHACO AND INTRAOCULAR LENS PLACEMENT ([place]);  Surgeon: Birder Robson, MD;  Location: [facility] ORS;  Service: Ophthalmology;  Laterality: Right;  [place] 00:57 AP% 20.9 CDE 11.99 fluid pack lot #[id number]   CATARACT EXTRACTION W/PHACO Left 11/11/2019   Procedure: CATARACT EXTRACTION PHACO AND INTRAOCULAR LENS PLACEMENT ([place]) LEFT DIABETIC;  Surgeon: Birder Robson, MD;  Location: [facility] ORS;  Service: Ophthalmology;  Laterality: Left;  Lot #[id number] H [place]: 00:47.5 CDE: 6.24   CHOLECYSTECTOMY  1970   COLON SURGERY  2013   blocked colon   COLONOSCOPY N/A 12/29/2019   Procedure: COLONOSCOPY;  Surgeon: Virgel Manifold, MD;  Location: [facility] ENDOSCOPY;  Service: Endoscopy;  Laterality: N/A;   COLONOSCOPY WITH PROPOFOL N/A 01/20/2018    Procedure: COLONOSCOPY WITH PROPOFOL;  Surgeon: Lucilla Lame, MD;  Location: [facility] ENDOSCOPY;  Service: Endoscopy;  Laterality: N/A;   ENDARTERECTOMY FEMORAL Left 07/08/2018   Procedure: ENDARTERECTOMY FEMORAL;  Surgeon: Algernon Huxley, MD;  Location: [facility] ORS;  Service: Vascular;  Laterality: Left;   EYE SURGERY Right    cataract surgery   FOOT FUSION Right 2018   metal in foot   [place]  2010   lost 178 lbs and regained 40 lbs last few years   HUMERUS FRACTURE SURGERY Right    metal plate with screws   internal bleeding  2016   ulcer in past   LOWER EXTREMITY ANGIOGRAM Left 07/08/2018   Procedure: LOWER EXTREMITY ANGIOGRAM;  Surgeon: Algernon Huxley, MD;  Location: [facility] ORS;  Service: Vascular;  Laterality: Left;   LOWER EXTREMITY ANGIOGRAPHY Left 05/27/2018   Procedure: LOWER EXTREMITY ANGIOGRAPHY;  Surgeon: Algernon Huxley,  MD;  Location: [place] CV [facility];  Service: Cardiovascular;  Laterality: Left;   LOWER EXTREMITY ANGIOGRAPHY Right 12/07/2018   Procedure: LOWER EXTREMITY ANGIOGRAPHY;  Surgeon: Algernon Huxley, MD;  Location: [place] [facility];  Service: Cardiovascular;  Laterality: Right;   LOWER EXTREMITY ANGIOGRAPHY Left 05/10/2019   Procedure: LOWER EXTREMITY ANGIOGRAPHY;  Surgeon: Algernon Huxley, MD;  Location: [place] [facility];  Service: Cardiovascular;  Laterality: Left;   MOUTH SURGERY     root canals and crowns and extractions   MOUTH SURGERY  09/30/2019   OVARY SURGERY     SKIN GRAFT Right 2018   RT foot. foot has been rebuilt.  it is full of metal   TENOTOMY ACHILLES TENDON Right    Percuntaneous. metal in foot   TONSILLECTOMY     VISCERAL ANGIOGRAPHY N/A 12/31/2019   Procedure: VISCERAL ANGIOGRAPHY;  Surgeon: Algernon Huxley, MD;  Location: [place] [facility];  Service: Cardiovascular;  Laterality: N/A;   WOUND DEBRIDEMENT Left 07/23/2018   Procedure: DEBRIDEMENT WOUND;  Surgeon: Algernon Huxley, MD;  Location: [facility] OR[facility];  Service: Vascular;   Laterality: Left;    Prior to Admission medications   Medication Sig Start Date End Date Taking? Authorizing Provider  acetaminophen (TYLENOL) 325 MG tablet Take 2 tablets (650 mg total) by mouth every 6 (six) hours as needed for mild pain, moderate pain, headache or fever. 12/31/19   Vashti Hey, MD  albuterol (VENTOLIN HFA) 108 (90 Base) MCG/ACT inhaler INHALE 2 PUFFS INTO THE LUNGS EVERY 6 HOURS AS NEEDED FOR WHEEZING OR SHORTNESS OF BREATH 04/04/20   [name], PA-C  atorvastatin (LIPITOR) 10 MG tablet Take 1 tablet (10 mg total) by mouth at bedtime. 08/24/19   [name], PA-C  Cholecalciferol (VITAMIN D3) 250 MCG (10000 UT) TABS Take 10,000 Units by mouth daily.     [provider]  clopidogrel (PLAVIX) 75 MG tablet TAKE 1 TABLET BY MOUTH ONCE A DAY 01/08/21   Minna Merritts, MD  fluticasone (FLONASE) 50 MCG/ACT nasal spray Place 2 sprays into both nostrils daily as needed. 08/24/19   [name], PA-C  gabapentin (NEURONTIN) 400 MG capsule Take 1 capsule (400 mg total) by mouth 2 (two) times daily. 05/31/20   [name], PA-C  ipratropium-albuterol (DUONEB) 0.5-2.5 (3) MG/3ML SOLN Take 3 mLs by nebulization 3 (three) times daily as needed. Patient taking differently: Take 3 mLs by nebulization 3 (three) times daily as needed (Shortness of breath).  05/24/19   [name], PA-C  ipratropium-albuterol (DUONEB) 0.5-2.5 (3) MG/3ML SOLN Take 3 mLs by nebulization 3 (three) times daily as needed. 09/06/20 10/16/20  Menshew, Dannielle Karvonen, PA-C  lisinopril (ZESTRIL) 10 MG tablet Take 1 tablet (10 mg total) by mouth daily. 08/24/19   [name], PA-C  montelukast (SINGULAIR) 10 MG tablet Take 1 tablet (10 mg total) by mouth at bedtime. 08/24/19   [name], PA-C  Multiple Vitamins-Minerals (MULTIVITAMIN ADULTS 50+) TABS Take 1 tablet by mouth daily. 11/02/18   Poulose, Bethel Born, NP  pantoprazole (PROTONIX) 20 MG tablet Take 1 tablet (20 mg total) by mouth daily. 05/31/20    [name], PA-C  Respiratory Therapy Supplies (NEBULIZER/TUBING/MOUTHPIECE) KIT Disp one nebulizer machine, tubing set and mouthpiece kit Patient not taking: Reported on 05/31/2020 05/24/19   [name], PA-C  sertraline (ZOLOFT) 100 MG tablet TAKE 1 AND 1/2 TABLETS (150 MG TOTAL) BY MOUTH DAILY Patient taking differently: Take 100 mg by mouth daily.  08/24/19  [name], PA-C  umeclidinium-vilanterol (ANORO ELLIPTA) 62.5-25 MCG/INH AEPB Inhale 1 puff into the lungs daily. 08/24/19   [name], PA-C  varenicline (CHANTIX) 1 MG tablet  06/01/20   [provider]  vitamin C (ASCORBIC ACID) 500 MG tablet Take 1,000 mg by mouth every other day.     [provider]  zinc gluconate 50 MG tablet Take 50 mg by mouth daily.    [provider]    Allergies  Allergen Reactions   Meloxicam Other (See Comments)    GI bleeding   Nsaids Other (See Comments)    Gi bleeding   Sitagliptin Hives and Itching    Januvia     Family History  Problem Relation Age of Onset   Asthma Mother    COPD Mother    Arthritis Father    Depression Father    Heart disease Father    Hypertension Father    Stroke Father    Heart attack Father    Arthritis Brother    Depression Brother    Diabetes Brother    Heart disease Brother    Hyperlipidemia Brother    Hypertension Brother    Stroke Brother    Vision loss Brother    Heart attack Brother    Diabetes Maternal Grandmother    Thyroid disease Daughter    Colitis Daughter    Breast cancer Neg Hx     Social History Social History   Tobacco Use   Smoking status: Some Days    Packs/day: 0.50    Years: 55.00    Pack years: 27.50    Types: Cigarettes    Start date: 07/01/1960    Last attempt to quit: 11/22/2018    Years since quitting: 2.3   Smokeless tobacco: Never   Tobacco comments:    trying to quit again; wants to use chantix but unable to afford  Vaping Use   Vaping Use: Never used  Substance Use Topics    Alcohol use: Yes    Alcohol/week: 0.0 standard drinks    Comment: 2 drinks per month   Drug use: Not Currently    Types: Marijuana    Comment: used for pain    Review of Systems Constitutional: Negative for LOC. Cardiovascular: Negative for chest pain. Respiratory: Negative for shortness of breath. Gastrointestinal: Negative for abdominal pain Musculoskeletal: Negative for musculoskeletal complaints Skin: Bruising to the patient's left face Neurological: Negative for headache All other ROS negative  ____________________________________________   PHYSICAL EXAM:  VITAL SIGNS: ED Triage Vitals  Enc Vitals Group     BP 03/28/21 1703 (!) 114/91     Pulse Rate 03/28/21 1703 70     Resp 03/28/21 1703 18     Temp 03/28/21 1703 98 F (36.7 C)     Temp src --      SpO2 03/28/21 1703 100 %     Weight 03/28/21 1826 145 lb 1 oz (65.8 kg)     Height 03/28/21 1826 5' (1.524 m)     Head Circumference --      Peak Flow --      Pain Score 03/28/21 1703 7     Pain Loc --      Pain Edu? --      Excl. in [place]? --    Constitutional: Alert and oriented. Well appearing and in no distress. Eyes: Normal exam ENT      Head: Small to medium hematoma to left forehead with mild tenderness to this  area.  No laceration.  Patient does have what appears to be gravity dependent ecchymosis down her left face.      Mouth/Throat: Mucous membranes are moist. Cardiovascular: Normal rate, regular rhythm.  Respiratory: Normal respiratory effort without tachypnea nor retractions. Breath sounds are clear  Gastrointestinal: Soft and nontender. No distention.   Musculoskeletal: Nontender with normal range of motion in all extremities.  Neurologic:  Normal speech and language. No gross focal neurologic deficits  Skin: Patient has older appearing bruising to the left face which is nontender and likely gravity dependent. Psychiatric: Mood and affect are normal.    ____________________________________________   RADIOLOGY  CT imaging shows no acute abnormality besides the hematoma.  ____________________________________________   INITIAL IMPRESSION / ASSESSMENT AND PLAN / ED COURSE  Pertinent labs & imaging results that were available during my care of the patient were reviewed by me and considered in my medical decision making (see chart for details).   Patient presents emergency department after a fall 3 days ago for evaluation.  Patient does have what appears to be a left forehead hematoma with gravity dependent ecchymosis below this.  Patient is on Coumadin but states she did not take it over the past 2 days.  Patient's labs have resulted reassuringly INR 0.9.  Instructed the patient to begin taking her Coumadin tomorrow once again.  Patient agreeable to plan.  Patient is to follow-up with her doctor.  No concerning findings on physical exam or CT imaging.   Amandajo J Jakel was evaluated in Emergency Department on 03/28/2021 for the symptoms described in the history of present illness. She was evaluated in the context of the global COVID-19 pandemic, which necessitated consideration that the patient might be at risk for infection with the SARS-CoV-2 virus that causes COVID-19. Institutional protocols and algorithms that pertain to the evaluation of patients at risk for COVID-19 are in a state of rapid change based on information released by regulatory bodies including the CDC and federal and state organizations. These policies and algorithms were followed during the patient's care in the ED.  ____________________________________________   FINAL CLINICAL IMPRESSION(S) / ED DIAGNOSES  Hematoma Head injury   Harvest Dark, MD 03/28/21 1921

## 2021-06-25 IMAGING — MG MM DIGITAL SCREENING BILAT W/ TOMO W/ CAD
8 of 16 series · 8 of 40 positions shown · non-contrast
Comparison: Previous exam(s).

ACR Breast Density Category a: The breast tissue is almost entirely
fatty.

CLINICAL DATA: Screening.

EXAM:
DIGITAL SCREENING BILATERAL MAMMOGRAM WITH TOMO AND CAD

[L XCCL synth-2D]
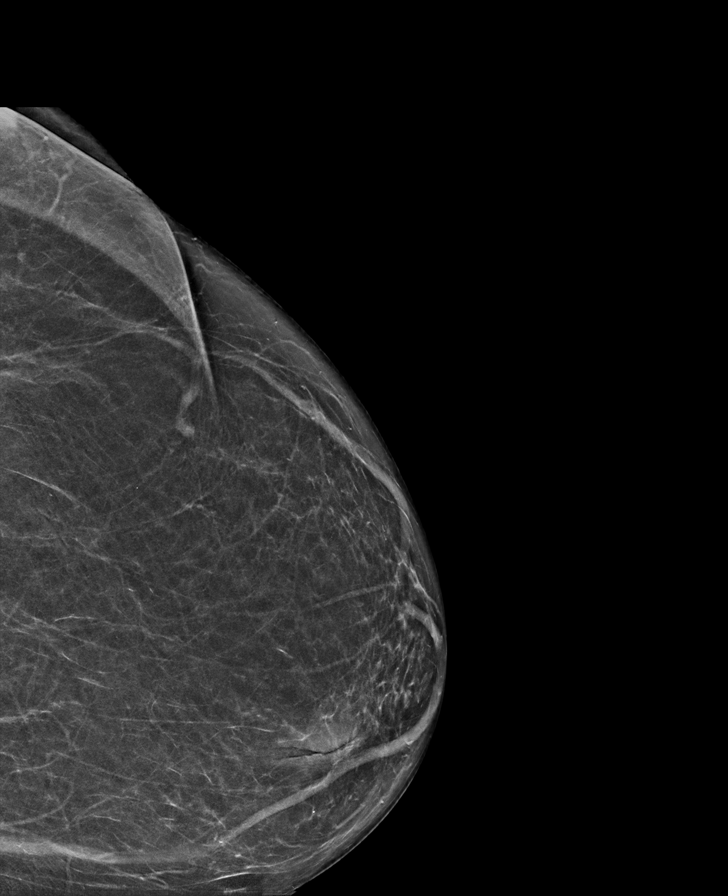

[R MLO synth-2D]
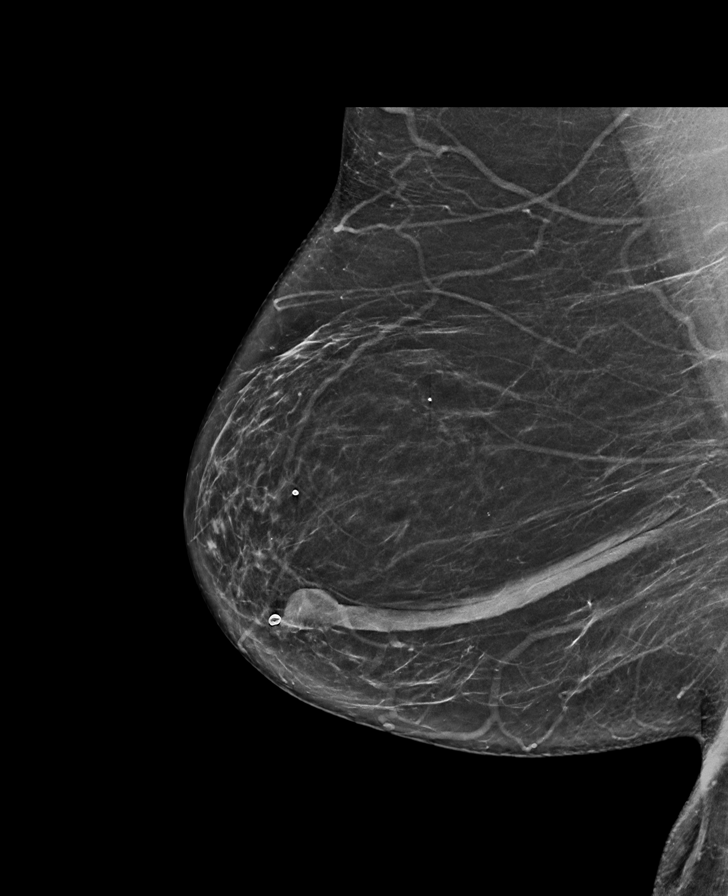

[L MLO synth-2D (1 of 2)]
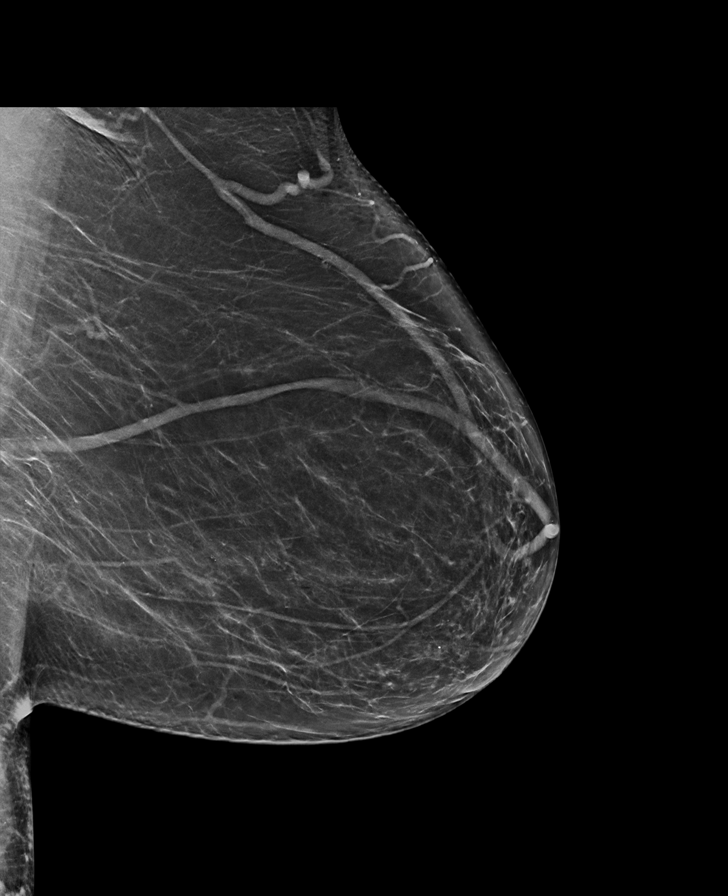

[L CC synth-2D]
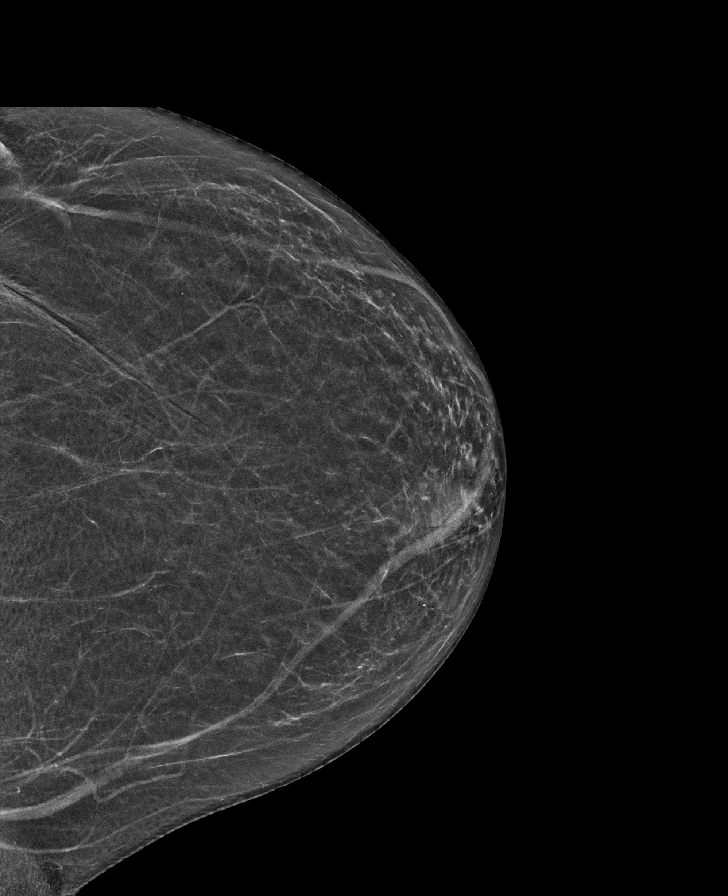

[R CC synth-2D]
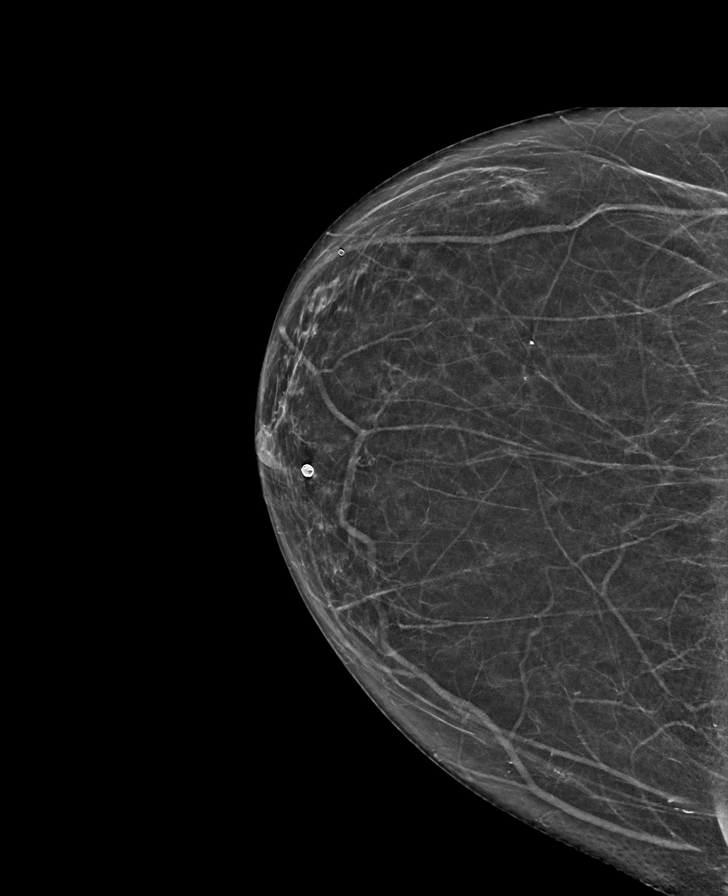

[R XCCL synth-2D]
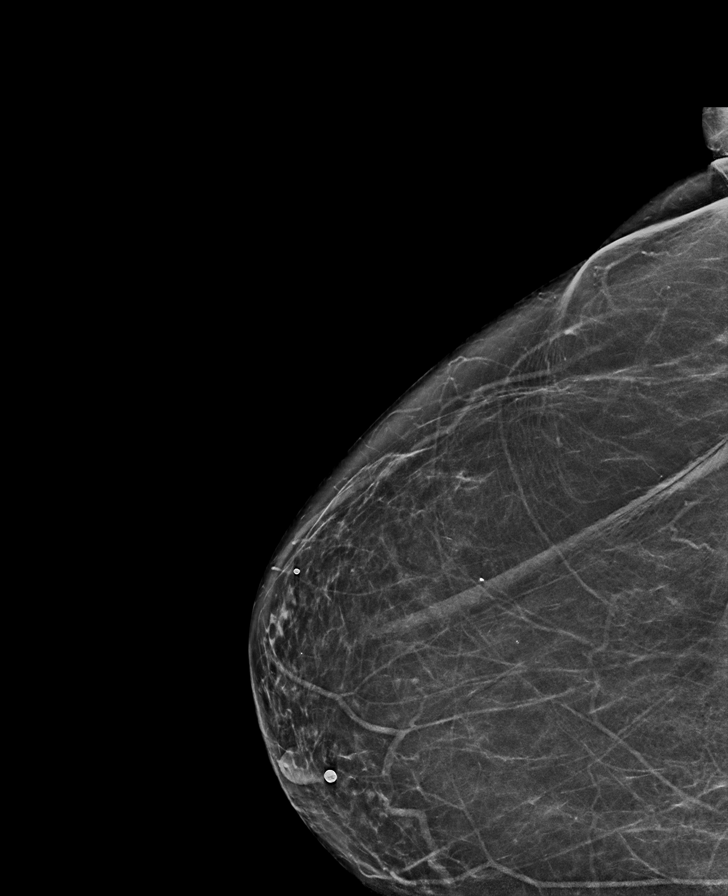

[L MLO synth-2D (2 of 2)]
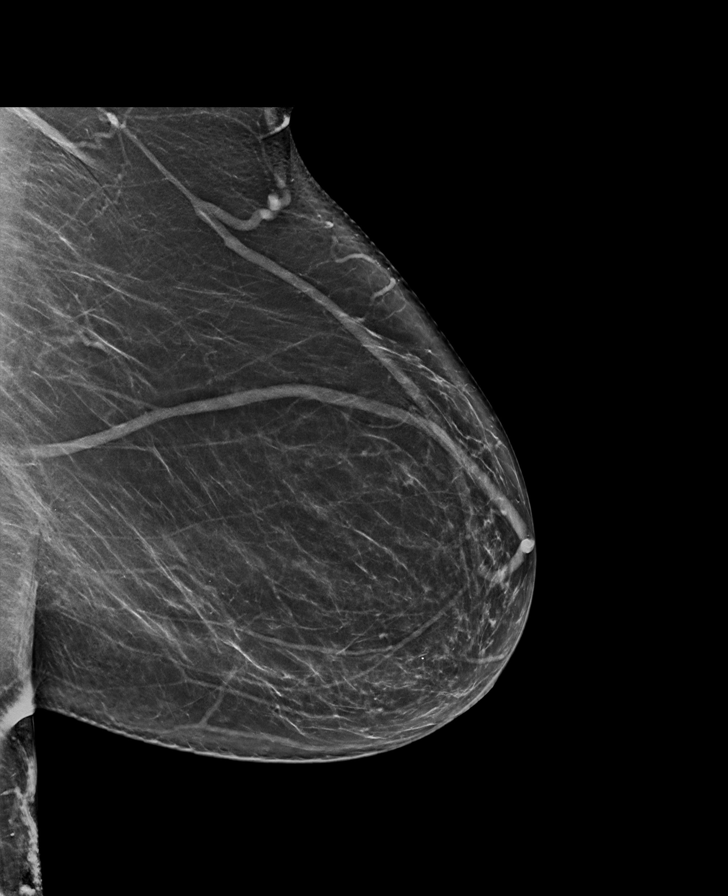

[R XCCL tomo · tomo slice 33/65.0]
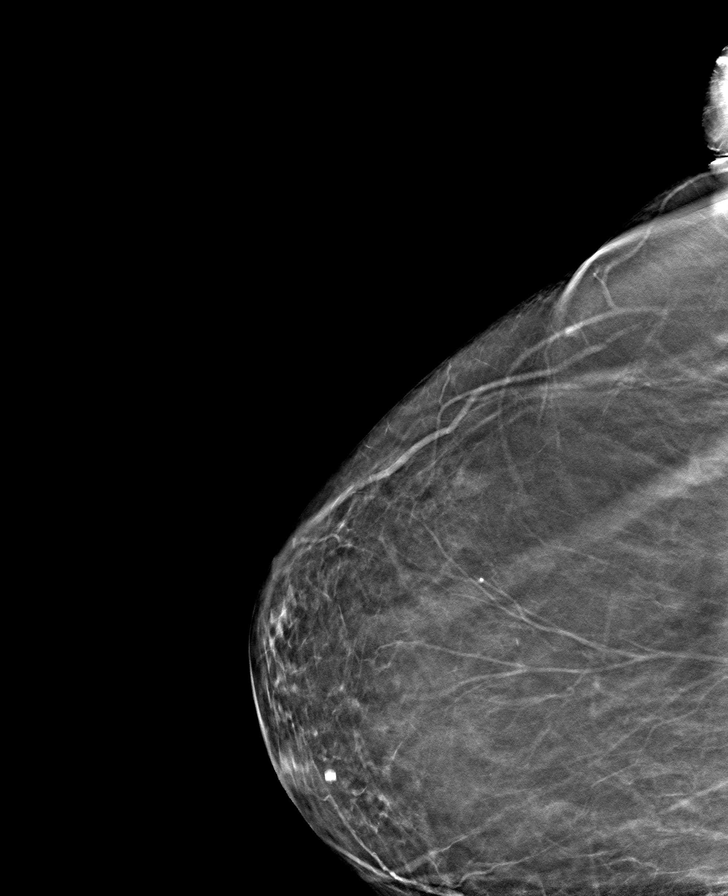

[8 of 40 positions shown; findings below may reference images not displayed]

FINDINGS: There are no findings suspicious for malignancy. Images were
processed with CAD.
IMPRESSION: No mammographic evidence of malignancy. A result letter of this
screening mammogram will be mailed directly to the patient.

RECOMMENDATION:
Screening mammogram in one year. (Code:[id number]-[id number])

BI-RADS CATEGORY  1: Negative.

## 2021-07-21 ENCOUNTER — Emergency Department: Payer: Medicare Other

## 2021-07-21 ENCOUNTER — Other Ambulatory Visit: Payer: Self-pay

## 2021-07-21 ENCOUNTER — Inpatient Hospital Stay
Admission: EM | Admit: 2021-07-21 | Discharge: 2021-07-24 | DRG: 280 | Disposition: A | Discharge status: Other Acute Inpatient Hospital | Payer: Medicare Other | Attending: Internal Medicine | Admitting: Internal Medicine

## 2021-07-21 DIAGNOSIS — I2510 Atherosclerotic heart disease of native coronary artery without angina pectoris: Secondary | ICD-10-CM | POA: Diagnosis not present

## 2021-07-21 DIAGNOSIS — Z515 Encounter for palliative care: Secondary | ICD-10-CM | POA: Diagnosis not present

## 2021-07-21 DIAGNOSIS — E785 Hyperlipidemia, unspecified: Secondary | ICD-10-CM | POA: Diagnosis present

## 2021-07-21 DIAGNOSIS — Z825 Family history of asthma and other chronic lower respiratory diseases: Secondary | ICD-10-CM | POA: Diagnosis not present

## 2021-07-21 DIAGNOSIS — E43 Unspecified severe protein-calorie malnutrition: Secondary | ICD-10-CM | POA: Diagnosis not present

## 2021-07-21 DIAGNOSIS — Z83438 Family history of other disorder of lipoprotein metabolism and other lipidemia: Secondary | ICD-10-CM

## 2021-07-21 DIAGNOSIS — J449 Chronic obstructive pulmonary disease, unspecified: Secondary | ICD-10-CM | POA: Diagnosis present

## 2021-07-21 DIAGNOSIS — I517 Cardiomegaly: Secondary | ICD-10-CM | POA: Diagnosis not present

## 2021-07-21 DIAGNOSIS — I252 Old myocardial infarction: Secondary | ICD-10-CM

## 2021-07-21 DIAGNOSIS — I110 Hypertensive heart disease with heart failure: Secondary | ICD-10-CM | POA: Diagnosis present

## 2021-07-21 DIAGNOSIS — E871 Hypo-osmolality and hyponatremia: Secondary | ICD-10-CM | POA: Diagnosis present

## 2021-07-21 DIAGNOSIS — Z7951 Long term (current) use of inhaled steroids: Secondary | ICD-10-CM

## 2021-07-21 DIAGNOSIS — Z9049 Acquired absence of other specified parts of digestive tract: Secondary | ICD-10-CM

## 2021-07-21 DIAGNOSIS — Z720 Tobacco use: Secondary | ICD-10-CM | POA: Diagnosis not present

## 2021-07-21 DIAGNOSIS — Z8261 Family history of arthritis: Secondary | ICD-10-CM | POA: Diagnosis not present

## 2021-07-21 DIAGNOSIS — M5136 Other intervertebral disc degeneration, lumbar region: Secondary | ICD-10-CM | POA: Diagnosis present

## 2021-07-21 DIAGNOSIS — I5033 Acute on chronic diastolic (congestive) heart failure: Secondary | ICD-10-CM | POA: Diagnosis not present

## 2021-07-21 DIAGNOSIS — R634 Abnormal weight loss: Secondary | ICD-10-CM | POA: Diagnosis not present

## 2021-07-21 DIAGNOSIS — D649 Anemia, unspecified: Secondary | ICD-10-CM | POA: Diagnosis present

## 2021-07-21 DIAGNOSIS — I25110 Atherosclerotic heart disease of native coronary artery with unstable angina pectoris: Secondary | ICD-10-CM | POA: Diagnosis present

## 2021-07-21 DIAGNOSIS — I739 Peripheral vascular disease, unspecified: Secondary | ICD-10-CM | POA: Diagnosis not present

## 2021-07-21 DIAGNOSIS — Z9911 Dependence on respirator [ventilator] status: Secondary | ICD-10-CM | POA: Diagnosis not present

## 2021-07-21 DIAGNOSIS — Z9114 Patient's other noncompliance with medication regimen: Secondary | ICD-10-CM

## 2021-07-21 DIAGNOSIS — K551 Chronic vascular disorders of intestine: Secondary | ICD-10-CM | POA: Diagnosis present

## 2021-07-21 DIAGNOSIS — Z7902 Long term (current) use of antithrombotics/antiplatelets: Secondary | ICD-10-CM | POA: Diagnosis not present

## 2021-07-21 DIAGNOSIS — I214 Non-ST elevation (NSTEMI) myocardial infarction: Secondary | ICD-10-CM | POA: Diagnosis present

## 2021-07-21 DIAGNOSIS — F32A Depression, unspecified: Secondary | ICD-10-CM | POA: Diagnosis present

## 2021-07-21 DIAGNOSIS — I7389 Other specified peripheral vascular diseases: Secondary | ICD-10-CM | POA: Diagnosis not present

## 2021-07-21 DIAGNOSIS — L89322 Pressure ulcer of left buttock, stage 2: Secondary | ICD-10-CM | POA: Diagnosis present

## 2021-07-21 DIAGNOSIS — Z823 Family history of stroke: Secondary | ICD-10-CM

## 2021-07-21 DIAGNOSIS — I350 Nonrheumatic aortic (valve) stenosis: Secondary | ICD-10-CM

## 2021-07-21 DIAGNOSIS — Z20822 Contact with and (suspected) exposure to covid-19: Secondary | ICD-10-CM | POA: Diagnosis present

## 2021-07-21 DIAGNOSIS — E1151 Type 2 diabetes mellitus with diabetic peripheral angiopathy without gangrene: Secondary | ICD-10-CM | POA: Diagnosis present

## 2021-07-21 DIAGNOSIS — I272 Pulmonary hypertension, unspecified: Secondary | ICD-10-CM | POA: Diagnosis present

## 2021-07-21 DIAGNOSIS — F17200 Nicotine dependence, unspecified, uncomplicated: Secondary | ICD-10-CM | POA: Diagnosis not present

## 2021-07-21 DIAGNOSIS — I5021 Acute systolic (congestive) heart failure: Secondary | ICD-10-CM

## 2021-07-21 DIAGNOSIS — I5043 Acute on chronic combined systolic (congestive) and diastolic (congestive) heart failure: Secondary | ICD-10-CM | POA: Diagnosis not present

## 2021-07-21 DIAGNOSIS — I2584 Coronary atherosclerosis due to calcified coronary lesion: Secondary | ICD-10-CM | POA: Diagnosis present

## 2021-07-21 DIAGNOSIS — Z833 Family history of diabetes mellitus: Secondary | ICD-10-CM

## 2021-07-21 DIAGNOSIS — I509 Heart failure, unspecified: Secondary | ICD-10-CM

## 2021-07-21 DIAGNOSIS — K219 Gastro-esophageal reflux disease without esophagitis: Secondary | ICD-10-CM | POA: Diagnosis present

## 2021-07-21 DIAGNOSIS — J9601 Acute respiratory failure with hypoxia: Secondary | ICD-10-CM | POA: Diagnosis not present

## 2021-07-21 DIAGNOSIS — E119 Type 2 diabetes mellitus without complications: Secondary | ICD-10-CM

## 2021-07-21 DIAGNOSIS — Z8249 Family history of ischemic heart disease and other diseases of the circulatory system: Secondary | ICD-10-CM

## 2021-07-21 DIAGNOSIS — I25118 Atherosclerotic heart disease of native coronary artery with other forms of angina pectoris: Secondary | ICD-10-CM | POA: Diagnosis not present

## 2021-07-21 DIAGNOSIS — Z7189 Other specified counseling: Secondary | ICD-10-CM | POA: Diagnosis not present

## 2021-07-21 DIAGNOSIS — G9341 Metabolic encephalopathy: Secondary | ICD-10-CM | POA: Diagnosis not present

## 2021-07-21 DIAGNOSIS — Z9884 Bariatric surgery status: Secondary | ICD-10-CM

## 2021-07-21 DIAGNOSIS — F17210 Nicotine dependence, cigarettes, uncomplicated: Secondary | ICD-10-CM | POA: Diagnosis present

## 2021-07-21 DIAGNOSIS — I959 Hypotension, unspecified: Secondary | ICD-10-CM | POA: Diagnosis not present

## 2021-07-21 DIAGNOSIS — Z821 Family history of blindness and visual loss: Secondary | ICD-10-CM

## 2021-07-21 DIAGNOSIS — Z9889 Other specified postprocedural states: Secondary | ICD-10-CM | POA: Diagnosis not present

## 2021-07-21 DIAGNOSIS — R0602 Shortness of breath: Secondary | ICD-10-CM

## 2021-07-21 DIAGNOSIS — Z888 Allergy status to other drugs, medicaments and biological substances status: Secondary | ICD-10-CM

## 2021-07-21 DIAGNOSIS — Z79899 Other long term (current) drug therapy: Secondary | ICD-10-CM

## 2021-07-21 DIAGNOSIS — I119 Hypertensive heart disease without heart failure: Secondary | ICD-10-CM | POA: Diagnosis present

## 2021-07-21 DIAGNOSIS — J810 Acute pulmonary edema: Secondary | ICD-10-CM | POA: Diagnosis not present

## 2021-07-21 DIAGNOSIS — J9602 Acute respiratory failure with hypercapnia: Secondary | ICD-10-CM | POA: Diagnosis not present

## 2021-07-21 DIAGNOSIS — Z818 Family history of other mental and behavioral disorders: Secondary | ICD-10-CM

## 2021-07-21 DIAGNOSIS — Z66 Do not resuscitate: Secondary | ICD-10-CM | POA: Diagnosis not present

## 2021-07-21 DIAGNOSIS — Z90710 Acquired absence of both cervix and uterus: Secondary | ICD-10-CM

## 2021-07-21 DIAGNOSIS — Z886 Allergy status to analgesic agent status: Secondary | ICD-10-CM

## 2021-07-21 DIAGNOSIS — R570 Cardiogenic shock: Secondary | ICD-10-CM | POA: Diagnosis not present

## 2021-07-21 HISTORY — DX: Non-ST elevation (NSTEMI) myocardial infarction: I21.4

## 2021-07-21 LAB — BASIC METABOLIC PANEL
Anion gap: 8 (ref 5–15)
BUN: 17 mg/dL (ref 8–23)
CO2: 26 mmol/L (ref 22–32)
Calcium: 9 mg/dL (ref 8.9–10.3)
Chloride: 92 mmol/L — ABNORMAL LOW (ref 98–111)
Creatinine, Ser: 0.62 mg/dL (ref 0.44–1.00)
GFR, Estimated: >60 mL/min (ref >60)
Glucose, Bld: 159 mg/dL — ABNORMAL HIGH (ref 70–99)
Potassium: 3.8 mmol/L (ref 3.5–5.1)
Sodium: 126 mmol/L — ABNORMAL LOW (ref 135–145)

## 2021-07-21 LAB — CBC
HCT: 29.9 % — ABNORMAL LOW (ref 36.0–46.0)
Hemoglobin: 10 g/dL — ABNORMAL LOW (ref 12.0–15.0)
MCH: 26.7 pg (ref 26.0–34.0)
MCHC: 33.4 g/dL (ref 30.0–36.0)
MCV: 79.7 fL — ABNORMAL LOW (ref 80.0–100.0)
Platelets: 204 10*3/uL (ref 150–400)
RBC: 3.75 MIL/uL — ABNORMAL LOW (ref 3.87–5.11)
RDW: 14.4 % (ref 11.5–15.5)
WBC: 12 10*3/uL — ABNORMAL HIGH (ref 4.0–10.5)
nRBC: 0 % (ref 0.0–0.2)

## 2021-07-21 LAB — TROPONIN I (HIGH SENSITIVITY): Troponin I (High Sensitivity): 3727 ng/L (ref <18)

## 2021-07-21 MED ORDER — ALBUTEROL SULFATE (2.5 MG/3ML) 0.083% IN NEBU: 2.5000 mg | INHALATION_SOLUTION | Freq: Four times a day (QID) | RESPIRATORY_TRACT | Status: DC | PRN

## 2021-07-21 MED ORDER — SODIUM CHLORIDE 0.9% FLUSH: 3.0000 mL | INTRAVENOUS | Status: DC | PRN

## 2021-07-21 MED ORDER — FLUTICASONE PROPIONATE 50 MCG/ACT NA SUSP
2.0000 | Freq: Every day | NASAL | Status: DC | PRN
  Administered 2021-07-22: 12:00:00 2 via NASAL
  Filled 2021-07-21 (×2): qty 16

## 2021-07-21 MED ORDER — SODIUM CHLORIDE 0.9% FLUSH
3.0000 mL | Freq: Two times a day (BID) | INTRAVENOUS | Status: DC
  Administered 2021-07-23 – 2021-07-24 (×2): 3 mL via INTRAVENOUS

## 2021-07-21 MED ORDER — ASPIRIN 81 MG PO CHEW
324.0000 mg | CHEWABLE_TABLET | Freq: Once | ORAL | Status: AC
  Administered 2021-07-21: 324 mg via ORAL
  Filled 2021-07-21: qty 4

## 2021-07-21 MED ORDER — HEPARIN (PORCINE) 25000 UT/250ML-% IV SOLN
1350.0000 [IU]/h | INTRAVENOUS | Status: DC
  Administered 2021-07-21: 700 [IU]/h via INTRAVENOUS
  Administered 2021-07-22: 22:00:00 1150 [IU]/h via INTRAVENOUS
  Filled 2021-07-21 (×2): qty 250

## 2021-07-21 MED ORDER — ACETAMINOPHEN 325 MG PO TABS: 650.0000 mg | ORAL_TABLET | ORAL | Status: DC | PRN

## 2021-07-21 MED ORDER — SODIUM CHLORIDE 0.9 % IV SOLN: 250.0000 mL | INTRAVENOUS | Status: DC | PRN

## 2021-07-21 MED ORDER — GABAPENTIN 400 MG PO CAPS
400.0000 mg | ORAL_CAPSULE | Freq: Two times a day (BID) | ORAL | Status: DC
  Administered 2021-07-22 – 2021-07-24 (×6): 400 mg via ORAL
  Filled 2021-07-21 (×6): qty 1

## 2021-07-21 MED ORDER — ATORVASTATIN CALCIUM 20 MG PO TABS: 10.0000 mg | ORAL_TABLET | Freq: Every day | ORAL | Status: DC

## 2021-07-21 MED ORDER — SERTRALINE HCL 50 MG PO TABS
150.0000 mg | ORAL_TABLET | Freq: Every day | ORAL | Status: DC
  Administered 2021-07-22 – 2021-07-24 (×3): 150 mg via ORAL
  Filled 2021-07-21 (×3): qty 3

## 2021-07-21 MED ORDER — ATORVASTATIN CALCIUM 20 MG PO TABS
40.0000 mg | ORAL_TABLET | Freq: Every day | ORAL | Status: DC
  Administered 2021-07-22 – 2021-07-24 (×4): 40 mg via ORAL
  Filled 2021-07-21 (×4): qty 2

## 2021-07-21 MED ORDER — ONDANSETRON HCL 4 MG/2ML IJ SOLN: 4.0000 mg | Freq: Four times a day (QID) | INTRAMUSCULAR | Status: DC | PRN

## 2021-07-21 MED ORDER — NITROGLYCERIN 0.4 MG SL SUBL
0.4000 mg | SUBLINGUAL_TABLET | SUBLINGUAL | Status: DC | PRN
  Administered 2021-07-24 (×2): 0.4 mg via SUBLINGUAL

## 2021-07-21 MED ORDER — HEPARIN SODIUM (PORCINE) 5000 UNIT/ML IJ SOLN: 60.0000 [IU]/kg | Freq: Once | INTRAMUSCULAR | Status: DC

## 2021-07-21 MED ORDER — MONTELUKAST SODIUM 10 MG PO TABS
10.0000 mg | ORAL_TABLET | Freq: Every day | ORAL | Status: DC
  Administered 2021-07-22 (×2): 10 mg via ORAL
  Filled 2021-07-21 (×2): qty 1

## 2021-07-21 MED ORDER — ASPIRIN EC 81 MG PO TBEC
81.0000 mg | DELAYED_RELEASE_TABLET | Freq: Every day | ORAL | Status: DC
  Administered 2021-07-22 – 2021-07-24 (×2): 81 mg via ORAL
  Filled 2021-07-21 (×2): qty 1

## 2021-07-21 MED ORDER — LISINOPRIL 10 MG PO TABS
10.0000 mg | ORAL_TABLET | Freq: Every day | ORAL | Status: DC
  Administered 2021-07-22 – 2021-07-23 (×2): 10 mg via ORAL
  Filled 2021-07-21 (×3): qty 1

## 2021-07-21 MED ORDER — IPRATROPIUM-ALBUTEROL 0.5-2.5 (3) MG/3ML IN SOLN
3.0000 mL | Freq: Four times a day (QID) | RESPIRATORY_TRACT | Status: DC | PRN
  Administered 2021-07-22 – 2021-07-24 (×3): 3 mL via RESPIRATORY_TRACT
  Filled 2021-07-21 (×3): qty 3

## 2021-07-21 MED ORDER — CLOPIDOGREL BISULFATE 75 MG PO TABS
75.0000 mg | ORAL_TABLET | Freq: Every day | ORAL | Status: DC
  Administered 2021-07-22 – 2021-07-23 (×2): 75 mg via ORAL
  Filled 2021-07-21 (×2): qty 1

## 2021-07-21 MED ORDER — UMECLIDINIUM-VILANTEROL 62.5-25 MCG/ACT IN AEPB
1.0000 | INHALATION_SPRAY | Freq: Every day | RESPIRATORY_TRACT | Status: DC
  Administered 2021-07-22 – 2021-07-24 (×2): 1 via RESPIRATORY_TRACT
  Filled 2021-07-21 (×3): qty 14

## 2021-07-21 MED ORDER — CLOPIDOGREL BISULFATE 75 MG PO TABS: 75.0000 mg | ORAL_TABLET | Freq: Every day | ORAL | Status: DC

## 2021-07-21 MED ORDER — HEPARIN (PORCINE) 25000 UT/250ML-% IV SOLN: 10.0000 [IU]/kg/h | INTRAVENOUS | Status: DC

## 2021-07-21 MED ORDER — HEPARIN BOLUS VIA INFUSION
3500.0000 [IU] | Freq: Once | INTRAVENOUS | Status: AC
  Administered 2021-07-21: 3500 [IU] via INTRAVENOUS
  Filled 2021-07-21: qty 3500

## 2021-07-21 NOTE — Progress Notes (Signed)
ANTICOAGULATION CONSULT NOTE - Initial Consult  Pharmacy Consult for Heparin  Indication: chest pain/ACS  Allergies  Allergen Reactions   Meloxicam Other (See Comments)    GI bleeding   Nsaids Other (See Comments)    Gi bleeding   Sitagliptin Hives and Itching    Januvia     Patient Measurements: Height: 5\' 1"  (154.9 cm) Weight: 59 kg (130 lb) IBW/kg (Calculated) : 47.8 Heparin Dosing Weight: 59 kg   Vital Signs: Temp: 98.5 F (36.9 C) (01/21 2134) Temp Source: Oral (01/21 2134) BP: 126/70 (01/21 2134) Pulse Rate: 69 (01/21 2134)  Labs: Recent Labs    07/21/21 2138  HGB 10.0*  HCT 29.9*  PLT 204  CREATININE 0.62  TROPONINIHS 3,727*    Estimated Creatinine Clearance: 50.9 mL/min (by C-G formula based on SCr of 0.62 mg/dL).   Medical History: Past Medical History:  Diagnosis Date   Allergy    seasonal allergies   Anemia 2014   needed 5 units of blood d/t passing out, weak   Anxiety    Aortic stenosis 12/24/2017   Echo Aug 2018   Arthritis    Asthma    allergy induced asthma   Cardiac murmur 12/12/2017   Cataract    Cataract    left   Complication of anesthesia    arrhythmia following colonoscopy   Degenerative disc disease, lumbar    Degenerative disc disease, lumbar    Depression    Diabetes mellitus    Diabetic neuropathy ([facility]) 11/16/2015   Dysrhythmia    stenosis of left ventricle   GERD (gastroesophageal reflux disease)    H/O transfusion    patient was given 5 units of blood while at [facility], blood type O+   History of chicken pox    History of hiatal hernia    History of measles, mumps, or rubella    HOH (hard of hearing)    does not use hearing aides yet   Hyperlipidemia    Hypertension    Irregular heartbeat    LVH (left ventricular hypertrophy) 12/24/2017   Echo Aug 2018   Neuropathy    Opiate use 11/16/2015   Peripheral arterial disease ([facility]) 05/06/2018   At rest, left > right; refer to vasc   Pes planus of both feet 11/16/2015    Plantar fasciitis of right foot 11/16/2015   Pneumonia    Wheezing     Medications:  (Not in a hospital admission)   Assessment: Pharmacy consulted to dose heparin in this 76 year old female admitted with ACS/NSTEMI.  No prior anticoag noted.  CrCl = 50.9 ml/min   Goal of Therapy:  Heparin level 0.3-0.7 units/ml Monitor platelets by anticoagulation protocol: Yes   Plan:  Give 3500 units bolus x 1 Start heparin infusion at 700 units/hr Check anti-Xa level in 8 hours and daily while on heparin Continue to monitor H&H and platelets  Charlot Gouin D 07/21/2021,10:48 PM

## 2021-07-21 NOTE — H&P (Signed)
History and Physical    Andrea Holden DOB: 08-08-1945 DOA: 07/21/2021  PCP: Marguerita Merles, MD    Patient coming from:  Home    Chief Complaint:  SOB   HPI:  Andrea Holden is a 76 y.o. female seen in ed with complaints of SOB since Thursday.  Patient states that shortness of breath has been progressive and getting progressively worse she will not able to lay flat she has been sleeping in her recliner.  Patient denies any chest pain has been having severe upper back and neck pain.  Patient has been also becoming shortness of breath  Sob few days.since Thursday .progressed, dyspnea on exertion,orthopnea.  Neck and shoulder and tight and hurting.dull pain.  Pt has h/o stents in her legs and continues to smoke she chronically reports ty  Pt has past medical history of pad, tobacco abuse, resolved DM ii, GERD,  ED Course:   Vitals:   07/21/21 2300 07/21/21 2330 07/22/21 0000 07/22/21 0030  BP: (!) 126/58  (!) 110/51 (!) 111/52  Pulse:  72 68 65  Resp: '15 16 16 14  ' Temp:      TempSrc:      SpO2:  97% 95% 98%  Weight:      Height:      In the emergency room patient is alert awake oriented gives history and daughter at bedside. Vitals are stable patient is on room air. Lab work shows sodium 126 chloride 92 glucose 159 WBC count of 12 hemoglobin of 10 MCV 79.7 troponin of 3727.  Repeat pending.  Initial EKG shows sinus rhythm at 73 with normal axis T wave inversion in aVL but no other beats.  And chest x-ray shows increased pulmonary markings consistent with pulmonary vascular congestion.  In the emergency room patient was given  Review of Systems:  Review of Systems  Constitutional:  Positive for malaise/fatigue.  Respiratory:  Positive for shortness of breath.   Musculoskeletal:  Positive for back pain and neck pain.  All other systems reviewed and are negative.    Past Medical History:  Diagnosis Date   Allergy    seasonal allergies   Anemia 2014    needed 5 units of blood d/t passing out, weak   Anxiety    Aortic stenosis 12/24/2017   Echo Aug 2018   Arthritis    Asthma    allergy induced asthma   Cardiac murmur 12/12/2017   Cataract    Cataract    left   Complication of anesthesia    arrhythmia following colonoscopy   Degenerative disc disease, lumbar    Degenerative disc disease, lumbar    Depression    Diabetes mellitus    Diabetic neuropathy ([place]) 11/16/2015   Dysrhythmia    stenosis of left ventricle   GERD (gastroesophageal reflux disease)    H/O transfusion    patient was given 5 units of blood while at [place], blood type O+   History of chicken pox    History of hiatal hernia    History of measles, mumps, or rubella    HOH (hard of hearing)    does not use hearing aides yet   Hyperlipidemia    Hypertension    Irregular heartbeat    LVH (left ventricular hypertrophy) 12/24/2017   Echo Aug 2018   Neuropathy    NSTEMI (non-ST elevated myocardial infarction) ([place]) 07/21/2021   Opiate use 11/16/2015   Peripheral arterial disease ([place]) 05/06/2018   At rest, left >  right; refer to vasc   Pes planus of both feet 11/16/2015   Plantar fasciitis of right foot 11/16/2015   Pneumonia    Wheezing     Past Surgical History:  Procedure Laterality Date   ABDOMINAL HYSTERECTOMY     APPLICATION OF WOUND VAC Left 07/23/2018   Procedure: APPLICATION OF WOUND VAC;  Surgeon: Algernon Huxley, MD;  Location: [facility] ORS;  Service: Vascular;  Laterality: Left;   banck injections     CATARACT EXTRACTION W/PHACO Right 05/30/2015   Procedure: CATARACT EXTRACTION PHACO AND INTRAOCULAR LENS PLACEMENT ([place]);  Surgeon: Birder Robson, MD;  Location: [facility] ORS;  Service: Ophthalmology;  Laterality: Right;  [place] 00:57 AP% 20.9 CDE 11.99 fluid pack lot #[id number]   CATARACT EXTRACTION W/PHACO Left 11/11/2019   Procedure: CATARACT EXTRACTION PHACO AND INTRAOCULAR LENS PLACEMENT ([place]) LEFT DIABETIC;  Surgeon: Birder Robson, MD;  Location: [facility]  ORS;  Service: Ophthalmology;  Laterality: Left;  Lot #[id number] H [place]: 00:47.5 CDE: 6.24   CHOLECYSTECTOMY  1970   COLON SURGERY  2013   blocked colon   COLONOSCOPY N/A 12/29/2019   Procedure: COLONOSCOPY;  Surgeon: Virgel Manifold, MD;  Location: [facility] ENDOSCOPY;  Service: Endoscopy;  Laterality: N/A;   COLONOSCOPY WITH PROPOFOL N/A 01/20/2018   Procedure: COLONOSCOPY WITH PROPOFOL;  Surgeon: Lucilla Lame, MD;  Location: [facility];  Service: Endoscopy;  Laterality: N/A;   ENDARTERECTOMY FEMORAL Left 07/08/2018   Procedure: ENDARTERECTOMY FEMORAL;  Surgeon: Algernon Huxley, MD;  Location: [facility] ORS;  Service: Vascular;  Laterality: Left;   EYE SURGERY Right    cataract surgery   FOOT FUSION Right 2018   metal in foot   [place]  2010   lost 178 lbs and regained 40 lbs last few years   HUMERUS FRACTURE SURGERY Right    metal plate with screws   internal bleeding  2016   ulcer in past   LOWER EXTREMITY ANGIOGRAM Left 07/08/2018   Procedure: LOWER EXTREMITY ANGIOGRAM;  Surgeon: Algernon Huxley, MD;  Location: [facility] ORS;  Service: Vascular;  Laterality: Left;   LOWER EXTREMITY ANGIOGRAPHY Left 05/27/2018   Procedure: LOWER EXTREMITY ANGIOGRAPHY;  Surgeon: Algernon Huxley, MD;  Location: [place] CV LAB;  Service: Cardiovascular;  Laterality: Left;   LOWER EXTREMITY ANGIOGRAPHY Right 12/07/2018   Procedure: LOWER EXTREMITY ANGIOGRAPHY;  Surgeon: Algernon Huxley, MD;  Location: [place] CV LAB;  Service: Cardiovascular;  Laterality: Right;   LOWER EXTREMITY ANGIOGRAPHY Left 05/10/2019   Procedure: LOWER EXTREMITY ANGIOGRAPHY;  Surgeon: Algernon Huxley, MD;  Location: [place] [facility] LAB;  Service: Cardiovascular;  Laterality: Left;   MOUTH SURGERY     root canals and crowns and extractions   MOUTH SURGERY  09/30/2019   OVARY SURGERY     SKIN GRAFT Right 2018   RT foot. foot has been rebuilt.  it is full of metal   TENOTOMY ACHILLES TENDON Right     Percuntaneous. metal in foot   TONSILLECTOMY     VISCERAL ANGIOGRAPHY N/A 12/31/2019   Procedure: VISCERAL ANGIOGRAPHY;  Surgeon: Algernon Huxley, MD;  Location: [place] [facility] LAB;  Service: Cardiovascular;  Laterality: N/A;   WOUND DEBRIDEMENT Left 07/23/2018   Procedure: DEBRIDEMENT WOUND;  Surgeon: Algernon Huxley, MD;  Location: [facility] ORS;  Service: Vascular;  Laterality: Left;     reports that she has been smoking cigarettes. She started smoking about 61 years ago. She has a 27.50 pack-year smoking history.  She has never used smokeless tobacco. She reports current alcohol use. She reports that she does not currently use drugs after having used the following drugs: Marijuana.  Allergies  Allergen Reactions   Meloxicam Other (See Comments)    GI bleeding   Nsaids Other (See Comments)    Gi bleeding   Sitagliptin Hives and Itching    Januvia     Family History  Problem Relation Age of Onset   Asthma Mother    COPD Mother    Arthritis Father    Depression Father    Heart disease Father    Hypertension Father    Stroke Father    Heart attack Father    Arthritis Brother    Depression Brother    Diabetes Brother    Heart disease Brother    Hyperlipidemia Brother    Hypertension Brother    Stroke Brother    Vision loss Brother    Heart attack Brother    Diabetes Maternal Grandmother    Thyroid disease Daughter    Colitis Daughter    Breast cancer Neg Hx     Prior to Admission medications   Medication Sig Start Date End Date Taking? Authorizing Provider  acetaminophen (TYLENOL) 325 MG tablet Take 2 tablets (650 mg total) by mouth every 6 (six) hours as needed for mild pain, moderate pain, headache or fever. 12/31/19   Vashti Hey, MD  albuterol (VENTOLIN HFA) 108 (90 Base) MCG/ACT inhaler INHALE 2 PUFFS INTO THE LUNGS EVERY 6 HOURS AS NEEDED FOR WHEEZING OR SHORTNESS OF BREATH 04/04/20   [name], PA-C  atorvastatin (LIPITOR) 10 MG tablet Take 1 tablet (10 mg  total) by mouth at bedtime. 08/24/19   [name], PA-C  Cholecalciferol (VITAMIN D3) 250 MCG (10000 UT) TABS Take 10,000 Units by mouth daily.     [provider]  clopidogrel (PLAVIX) 75 MG tablet TAKE 1 TABLET BY MOUTH ONCE A DAY 01/08/21   Minna Merritts, MD  fluticasone (FLONASE) 50 MCG/ACT nasal spray Place 2 sprays into both nostrils daily as needed. 08/24/19   [name], PA-C  gabapentin (NEURONTIN) 400 MG capsule Take 1 capsule (400 mg total) by mouth 2 (two) times daily. 05/31/20   [name], PA-C  ipratropium-albuterol (DUONEB) 0.5-2.5 (3) MG/3ML SOLN Take 3 mLs by nebulization 3 (three) times daily as needed. Patient taking differently: Take 3 mLs by nebulization 3 (three) times daily as needed (Shortness of breath).  05/24/19   [name], PA-C  ipratropium-albuterol (DUONEB) 0.5-2.5 (3) MG/3ML SOLN Take 3 mLs by nebulization 3 (three) times daily as needed. 09/06/20 10/16/20  Menshew, Dannielle Karvonen, PA-C  lisinopril (ZESTRIL) 10 MG tablet Take 1 tablet (10 mg total) by mouth daily. 08/24/19   [name], PA-C  montelukast (SINGULAIR) 10 MG tablet Take 1 tablet (10 mg total) by mouth at bedtime. 08/24/19   [name], PA-C  Multiple Vitamins-Minerals (MULTIVITAMIN ADULTS 50+) TABS Take 1 tablet by mouth daily. 11/02/18   Poulose, Bethel Born, NP  pantoprazole (PROTONIX) 20 MG tablet Take 1 tablet (20 mg total) by mouth daily. 05/31/20   [name], PA-C  Respiratory Therapy Supplies (NEBULIZER/TUBING/MOUTHPIECE) KIT Disp one nebulizer machine, tubing set and mouthpiece kit Patient not taking: Reported on 05/31/2020 05/24/19   [name], PA-C  sertraline (ZOLOFT) 100 MG tablet TAKE 1 AND 1/2 TABLETS (150 MG TOTAL) BY MOUTH DAILY Patient taking differently: Take 100 mg by mouth daily.  08/24/19   [name], PA-C  umeclidinium-vilanterol (ANORO ELLIPTA) 62.5-25 MCG/INH AEPB Inhale 1 puff into the lungs daily. 08/24/19   [name], PA-C  varenicline (CHANTIX) 1 MG  tablet  06/01/20   [provider]  vitamin C (ASCORBIC ACID) 500 MG tablet Take 1,000 mg by mouth every other day.     [provider]  zinc gluconate 50 MG tablet Take 50 mg by mouth daily.    [provider]    Physical Exam: Vitals:   07/21/21 2300 07/21/21 2330 07/22/21 0000 07/22/21 0030  BP: (!) 126/58  (!) 110/51 (!) 111/52  Pulse:  72 68 65  Resp: '15 16 16 14  ' Temp:      TempSrc:      SpO2:  97% 95% 98%  Weight:      Height:       Physical Exam Vitals reviewed.  Constitutional:      General: She is not in acute distress.    Appearance: She is not ill-appearing.  HENT:     Head: Normocephalic and atraumatic.     Right Ear: External ear normal.     Left Ear: External ear normal.     Nose: Nose normal.     Mouth/Throat:     Mouth: Mucous membranes are moist.  Eyes:     Extraocular Movements: Extraocular movements intact.     Pupils: Pupils are equal, round, and reactive to light.  Neck:     Vascular: No carotid bruit.  Cardiovascular:     Rate and Rhythm: Normal rate and regular rhythm.     Pulses: Normal pulses.     Heart sounds: Normal heart sounds.  Pulmonary:     Effort: Pulmonary effort is normal.     Breath sounds: Normal breath sounds.  Abdominal:     General: Bowel sounds are normal. There is no distension.     Palpations: Abdomen is soft. There is no mass.     Tenderness: There is no abdominal tenderness. There is no guarding.     Hernia: No hernia is present.  Musculoskeletal:     Right lower leg: No edema.     Left lower leg: No edema.  Skin:    General: Skin is warm.  Neurological:     General: No focal deficit present.     Mental Status: She is alert and oriented to person, place, and time.  Psychiatric:        Mood and Affect: Mood normal.        Behavior: Behavior normal.   Labs on Admission: I have personally reviewed following labs and imaging studies No results for input(s): CKTOTAL, CKMB, TROPONINI in the  last 72 hours. Lab Results  Component Value Date   WBC 12.0 (H) 07/21/2021   HGB 10.0 (L) 07/21/2021   HCT 29.9 (L) 07/21/2021   MCV 79.7 (L) 07/21/2021   PLT 204 07/21/2021    Recent Labs  Lab 07/21/21 2138  NA 126*  K 3.8  CL 92*  CO2 26  BUN 17  CREATININE 0.62  CALCIUM 9.0  GLUCOSE 159*   Lab Results  Component Value Date   CHOL 134 11/30/2019   HDL 46 (L) 11/30/2019   LDLCALC 68 11/30/2019   TRIG 112 11/30/2019   Lab Results  Component Value Date   DDIMER 0.95 (H) 02/08/2011   Invalid input(s): POCBNP   COVID-19 Labs No results for input(s): DDIMER, FERRITIN, LDH, CRP in the last 72 hours. Lab Results  Component Value Date  [place] NEGATIVE 12/26/2019   [place] NEGATIVE 11/08/2019   [place] NEGATIVE 05/06/2019   SARSCOV2NAA NOT DETECTED 12/04/2018    Radiological Exams on Admission: DG Chest 2 View  Result Date: 07/21/2021 CLINICAL DATA:  Shortness of breath. EXAM: CHEST - 2 VIEW COMPARISON:  Chest radiograph dated 09/06/2020. FINDINGS: Background of emphysema. Diffuse interstitial prominence may be chronic or represent mild edema. There is blunting of the costophrenic angles which may represent small bilateral pleural effusions. No pneumothorax. The cardiac silhouette is within normal limits. Atherosclerotic calcification of the aorta. No acute osseous pathology. Degenerative changes of the spine. IMPRESSION: Emphysema with possible mild interstitial edema and small bilateral pleural effusions. Electronically Signed   By: Anner Crete M.D.   On: 07/21/2021 22:08    EKG: Independently reviewed.  Sinus rhythm 73 as above.   Assessment/Plan: Principal Problem:   NSTEMI (non-ST elevated myocardial infarction) ([place]) Active Problems:   Diabetes mellitus without complication ([place])   Hypertension with heart disease   CAD (coronary artery disease)   NSTEMI/CAD/PAD: Patient admitted to stepdown unit with continuous cardiac  monitoring. Continue patient on heparin drip continue lisinopril continue aspirin continue Plavix continue statin therapy.  And lipid panel. Supplemental oxygen as needed. As needed nitroglycerin and morphine as needed. Cardiology consult message sent Dr. Rockey Situ. NPO.   DM II; SSI/ glycemic protocol. A1c   HTN: Blood pressure (!) 111/52, pulse 65, temperature 98.5 F (36.9 C), temperature source Oral, resp. rate 14, height '5\' 1"'  (1.549 m), weight 59 kg, SpO2 98 %. We will continue patient on lisinopril.  -Aspiration, fall precautions. -Tobacco cessation guidance provided.,  Nicotine patch.   DVT prophylaxis:  Heparin GTT  Code Status:  Full code  Family Communication:  Alm Bustard (Daughter)  [phone] (Home Phone)   Disposition Plan:  Home  Consults called:  Cardiology a.m. message sent  Admission status: Inpatient   Medical Decision Making   Coding    Para Skeans MD [facility]  6 PM- 2 AM. Please contact me via secure Chat 6 PM-2 AM. To contact the [facility] Attending or Consulting provider [place] or covering provider during after hours [place], for this patient.   Check the care team in [facility] and look for a) attending/consulting [facility] provider listed and b) the [facility] team listed Log into [email or web address] and use [place]'s universal password to access. If you do not have the password, please contact the hospital operator. Locate the [facility] provider you are looking for under [facility] and page to a number that you can be directly reached. If you still have difficulty reaching the provider, please page the [facility] (Director on Call) for the Hospitalists listed on amion for assistance. [email or web address] 07/22/2021, 12:50 AM

## 2021-07-21 NOTE — ED Provider Notes (Signed)
[facility] Provider Note    Event Date/Time   First MD Initiated Contact with Patient 07/21/21 2231     (approximate)   History   Shortness of Breath   HPI  Andrea Holden is a 76 y.o. female who comes in complaining of increasing shortness of breath for 3 days.  She reports that about 3 days ago she had some really bad back tightness and went up into her neck.  It got better about 2 days ago.  Since then the shortness of breath been getting worse.  She never had anything quite like that back tightness before.  She has in the past had GI bleed after taking meloxicam and other nonsteroidals for long periods of time.  Last time she had that was 2 years ago.  She had stents placed in her abdominal arteries in 2021 and she has stents in her legs.  She takes Plavix.      Physical Exam   Triage Vital Signs: ED Triage Vitals  Enc Vitals Group     BP 07/21/21 2134 126/70     Pulse Rate 07/21/21 2134 69     Resp 07/21/21 2134 (!) 22     Temp 07/21/21 2134 98.5 F (36.9 C)     Temp Source 07/21/21 2134 Oral     SpO2 07/21/21 2134 95 %     Weight 07/21/21 2135 130 lb (59 kg)     Height 07/21/21 2135 5\' 1"  (1.549 m)     Head Circumference --      Peak Flow --      Pain Score 07/21/21 2135 4     Pain Loc --      Pain Edu? --      Excl. in GC? --     Most recent vital signs: Vitals:   07/21/21 2134  BP: 126/70  Pulse: 69  Resp: (!) 22  Temp: 98.5 F (36.9 C)  SpO2: 95%     General: Awake, alert, no distress.  CV:  Good peripheral perfusion.  Patient has a 3 out of 6 systolic murmur.  Heard best at the left lower sternal border but I can hear throughout the chest.  Patient reports this is old. Resp:  Normal effort.  There are crackles scattered throughout the lungs. Abd:  No distention.  Soft nontender Extremities: No edema   ED Results / Procedures / Treatments   Labs (all labs ordered are listed, but only abnormal results are  displayed) Labs Reviewed  BASIC METABOLIC PANEL - Abnormal; Notable for the following components:      Result Value   Sodium 126 (*)    Chloride 92 (*)    Glucose, Bld 159 (*)    All other components within normal limits  CBC - Abnormal; Notable for the following components:   WBC 12.0 (*)    RBC 3.75 (*)    Hemoglobin 10.0 (*)    HCT 29.9 (*)    MCV 79.7 (*)    All other components within normal limits  TROPONIN I (HIGH SENSITIVITY) - Abnormal; Notable for the following components:   Troponin I (High Sensitivity) 3,727 (*)    All other components within normal limits  PROTIME-INR  APTT  BRAIN NATRIURETIC PEPTIDE     EKG  EKG read interpreted by me shows normal sinus rhythm rate of 73 normal axis there is a flipped T in aVL no other acute changes are seen   RADIOLOGY Chest x-ray read  by radiology reviewed by me shows increased pulmonary markings consistent with fluid.  PROCEDURES:  Critical Care performed:   Procedures   MEDICATIONS ORDERED IN ED: Medications  aspirin chewable tablet 324 mg (has no administration in time range)  heparin injection 3,550 Units (has no administration in time range)  heparin ADULT infusion 100 units/mL (25000 units/215mL) (has no administration in time range)     IMPRESSION / MDM / ASSESSMENT AND PLAN / ED COURSE  I reviewed the triage vital signs and the nursing notes.                              Patient's history sounds like she was having a heart attack with pain referred to the back couple days ago.  She is not having any more pain now.  I am calling cardiology but in the meantime I will give her aspirin and heparin for her apparent NSTEMI.  She already takes Plavix.  I discussed this with the patient.  Patient is having some congestive failure symptoms and CHF on the chest x-ray.  BNP is pending.  I discussed the patient with the hospitalist.  She has asked me to paged the cardiologist which I am doing.  The patient is on the  cardiac monitor to evaluate for evidence of arrhythmia and/or significant heart rate changes.  Patient is not having any arrhythmias.  Patient's EKG looks essentially normal at this time.  Her troponin however is markedly elevated we will have to get her in the hospital.  She sounds like a vasculopath having stents in the belly in the legs.  Her sodium is low at 126.  This is new and will have to be addressed.  Her white blood count is elevated as well.  She was coughing up some yellow phlegm as well but this has changed to clear in the last couple days.  ----------------------------------------- 11:09 PM on 07/21/2021 ----------------------------------------- Discussed the patient in detail with Dr. Mariah Milling cardiology.  He feels she will likely need a cath on Monday.      FINAL CLINICAL IMPRESSION(S) / ED DIAGNOSES   Final diagnoses:  NSTEMI (non-ST elevated myocardial infarction) (HCC)  Congestive heart failure, unspecified HF chronicity, unspecified heart failure type (HCC)     Rx / DC Orders   ED Discharge Orders     None        Note:  This document was prepared using Dragon voice recognition software and may include unintentional dictation errors.   Arnaldo Natal, MD 07/21/21 [phone]

## 2021-07-21 NOTE — ED Triage Notes (Signed)
Pt presents to ER c/o increased sob x3 days.  Pt states she has been "fighting to breathe" for last few days.  States she has had trouble taking deep breath and states even talking has worn her out.  Pt A&O x4 at this time and speaking in complete sentences in triage.

## 2021-07-22 ENCOUNTER — Encounter: Payer: Self-pay | Admitting: Internal Medicine

## 2021-07-22 ENCOUNTER — Other Ambulatory Visit: Payer: Self-pay

## 2021-07-22 DIAGNOSIS — F17200 Nicotine dependence, unspecified, uncomplicated: Secondary | ICD-10-CM

## 2021-07-22 DIAGNOSIS — R0602 Shortness of breath: Secondary | ICD-10-CM

## 2021-07-22 DIAGNOSIS — I214 Non-ST elevation (NSTEMI) myocardial infarction: Principal | ICD-10-CM

## 2021-07-22 LAB — PROTIME-INR
INR: 1.1 (ref 0.8–1.2)
INR: 1.1 (ref 0.8–1.2)
Prothrombin Time: 14.3 seconds (ref 11.4–15.2)
Prothrombin Time: 14.2 seconds (ref 11.4–15.2)

## 2021-07-22 LAB — APTT: aPTT: 38 seconds — ABNORMAL HIGH (ref 24–36)

## 2021-07-22 LAB — CBC
HCT: 28.1 % — ABNORMAL LOW (ref 36.0–46.0)
Hemoglobin: 9.7 g/dL — ABNORMAL LOW (ref 12.0–15.0)
MCH: 27.6 pg (ref 26.0–34.0)
MCHC: 34.5 g/dL (ref 30.0–36.0)
MCV: 79.8 fL — ABNORMAL LOW (ref 80.0–100.0)
Platelets: 194 10*3/uL (ref 150–400)
RBC: 3.52 MIL/uL — ABNORMAL LOW (ref 3.87–5.11)
RDW: 14.6 % (ref 11.5–15.5)
WBC: 10.2 10*3/uL (ref 4.0–10.5)
nRBC: 0 % (ref 0.0–0.2)

## 2021-07-22 LAB — BASIC METABOLIC PANEL
Anion gap: 6 (ref 5–15)
BUN: 20 mg/dL (ref 8–23)
CO2: 25 mmol/L (ref 22–32)
Calcium: 9 mg/dL (ref 8.9–10.3)
Chloride: 95 mmol/L — ABNORMAL LOW (ref 98–111)
Creatinine, Ser: 0.56 mg/dL (ref 0.44–1.00)
GFR, Estimated: >60 mL/min (ref >60)
Glucose, Bld: 128 mg/dL — ABNORMAL HIGH (ref 70–99)
Potassium: 3.9 mmol/L (ref 3.5–5.1)
Sodium: 126 mmol/L — ABNORMAL LOW (ref 135–145)

## 2021-07-22 LAB — TROPONIN I (HIGH SENSITIVITY): Troponin I (High Sensitivity): 4188 ng/L (ref <18)

## 2021-07-22 LAB — TSH: TSH: 1.232 u[IU]/mL (ref 0.350–4.500)

## 2021-07-22 LAB — HEPARIN LEVEL (UNFRACTIONATED)
Heparin Unfractionated: <0.1 IU/mL — ABNORMAL LOW (ref 0.30–0.70)
Heparin Unfractionated: <0.1 IU/mL — ABNORMAL LOW (ref 0.30–0.70)

## 2021-07-22 LAB — LIPID PANEL
Cholesterol: 107 mg/dL (ref 0–200)
HDL: 46 mg/dL (ref >40)
LDL Cholesterol: 49 mg/dL (ref 0–99)
Total CHOL/HDL Ratio: 2.3 RATIO
Triglycerides: 58 mg/dL (ref <150)
VLDL: 12 mg/dL (ref 0–40)

## 2021-07-22 LAB — BRAIN NATRIURETIC PEPTIDE: B Natriuretic Peptide: 2354.7 pg/mL — ABNORMAL HIGH (ref 0.0–100.0)

## 2021-07-22 MED ORDER — HEPARIN BOLUS VIA INFUSION
1700.0000 [IU] | Freq: Once | INTRAVENOUS | Status: AC
  Administered 2021-07-22: 1700 [IU] via INTRAVENOUS
  Filled 2021-07-22: qty 1700

## 2021-07-22 MED ORDER — SODIUM CHLORIDE 0.9 % WEIGHT BASED INFUSION: 3.0000 mL/kg/h | INTRAVENOUS | Status: DC

## 2021-07-22 MED ORDER — METOPROLOL SUCCINATE ER 25 MG PO TB24
12.5000 mg | ORAL_TABLET | Freq: Every day | ORAL | Status: DC
  Administered 2021-07-22 – 2021-07-24 (×3): 12.5 mg via ORAL
  Filled 2021-07-22 (×3): qty 1

## 2021-07-22 MED ORDER — ACETAMINOPHEN 500 MG PO TABS
1000.0000 mg | ORAL_TABLET | Freq: Four times a day (QID) | ORAL | Status: DC | PRN
  Administered 2021-07-22 – 2021-07-23 (×2): 1000 mg via ORAL
  Filled 2021-07-22 (×2): qty 2

## 2021-07-22 MED ORDER — FUROSEMIDE 10 MG/ML IJ SOLN
40.0000 mg | Freq: Once | INTRAMUSCULAR | Status: AC
  Administered 2021-07-22: 40 mg via INTRAVENOUS
  Filled 2021-07-22: qty 4

## 2021-07-22 MED ORDER — ASPIRIN 81 MG PO CHEW
81.0000 mg | CHEWABLE_TABLET | ORAL | Status: AC
  Administered 2021-07-23: 81 mg via ORAL
  Filled 2021-07-22: qty 1

## 2021-07-22 MED ORDER — SODIUM CHLORIDE 0.9 % WEIGHT BASED INFUSION: 1.0000 mL/kg/h | INTRAVENOUS | Status: DC

## 2021-07-22 NOTE — H&P (View-Only) (Signed)
°Cardiology Consultation:  ° °Patient ID: Andrea Holden °MRN: [id number]; DOB: 03/28/1946 ° °Admit date: 07/21/2021 °Date of Consult: 07/22/2021 ° °PCP:  Miles, Linda M, MD °  °[facility] Providers °Cardiologist:  [facility] °Physician requesting consult: Dr. Malinda/Patel °Reason for consult: Non-STEMI ° ° °Patient Profile:  ° °Andrea Holden is a 76 y.o. female with a hx of smoking, COPD, diabetes, hyperlipidemia, severe PAD, history of left femoral endarterectomy, recent stenting aorta, lower extremity arterial with Dr. Dew, presenting with shortness of breath, back pain, non-STEMI ° °History of Present Illness:  ° °Andrea Holden reports she was in her usual state of health until 3 days ago, developed pain between her shoulder blades, significant shortness of breath.  Symptoms persisted for 1 to 2 days then back and neck pain has eased off to some degree but shortness of breath has persisted, °Reports unable to lay supine in bed, has been sleeping in a recliner ° she presented to the emergency room.  Initially felt she had strained something in her back but could not explain the shortness of breath and felt something was wrong. ° °Reports that she continues to smoke 1 pack/day ° °X-ray in emergency room with emphysema, small bilateral pleural effusions °Troponin 2300 °No chest CTA performed ° °This morning reports no significant back pain, mild shortness of breath at rest ° °Prior PAD history discussed with her °Prior intervention November 2020 °Percutaneous transluminal angioplasty of left tibioperoneal trunk and peroneal artery with 3 mm diameter angioplasty balloon °Percutaneous transluminal angioplasty of the left proximal SFA with 5 mm diameter by 15 cm Lutonix drug-coated angioplasty balloon °Viabahn stent placement to the proximal left SFA with 6 mm diameter by 10 cm length stent for greater than 50% residual stenosis after angioplasty ° °interventional procedure with Dr. Dew, 12/2019 ° Stent to  the SMA with 6 mm diameter x 29 mm length balloon expandable VBX stent postdilated with a 7 mm balloon proximally ° °Past Medical History:  °Diagnosis Date  ° Allergy   ° seasonal allergies  ° Anemia 2014  ° needed 5 units of blood d/t passing out, weak  ° Anxiety   ° Aortic stenosis 12/24/2017  ° Echo Aug 2018  ° Arthritis   ° Asthma   ° allergy induced asthma  ° Cardiac murmur 12/12/2017  ° Cataract   ° Cataract   ° left  ° Complication of anesthesia   ° arrhythmia following colonoscopy  ° Degenerative disc disease, lumbar   ° Degenerative disc disease, lumbar   ° Depression   ° Diabetes mellitus   ° Diabetic neuropathy (HCC) 11/16/2015  ° Dysrhythmia   ° stenosis of left ventricle  ° GERD (gastroesophageal reflux disease)   ° H/O transfusion   ° patient was given 5 units of blood while at [facility], blood type O+  ° History of chicken pox   ° History of hiatal hernia   ° History of measles, mumps, or rubella   ° HOH (hard of hearing)   ° does not use hearing aides yet  ° Hyperlipidemia   ° Hypertension   ° Irregular heartbeat   ° LVH (left ventricular hypertrophy) 12/24/2017  ° Echo Aug 2018  ° Neuropathy   ° NSTEMI (non-ST elevated myocardial infarction) (HCC) 07/21/2021  ° Opiate use 11/16/2015  ° Peripheral arterial disease (HCC) 05/06/2018  ° At rest, left > right; refer to vasc  ° Pes planus of both feet 11/16/2015  ° Plantar fasciitis of right foot 11/16/2015  °   Pneumonia    Wheezing     Past Surgical History:  Procedure Laterality Date   ABDOMINAL HYSTERECTOMY     APPLICATION OF WOUND VAC Left 07/23/2018   Procedure: APPLICATION OF WOUND VAC;  Surgeon: Algernon Huxley, MD;  Location: [facility];  Service: Vascular;  Laterality: Left;   banck injections     CATARACT EXTRACTION W/PHACO Right 05/30/2015   Procedure: CATARACT EXTRACTION PHACO AND INTRAOCULAR LENS PLACEMENT ([place]);  Surgeon: Birder Robson, MD;  Location: [facility];  Service: Ophthalmology;  Laterality: Right;  [place] 00:57 AP% 20.9 CDE 11.99 fluid  pack lot #[id number]   CATARACT EXTRACTION W/PHACO Left 11/11/2019   Procedure: CATARACT EXTRACTION PHACO AND INTRAOCULAR LENS PLACEMENT ([place]) LEFT DIABETIC;  Surgeon: Birder Robson, MD;  Location: [facility];  Service: Ophthalmology;  Laterality: Left;  Lot #[id number] H [place]: 00:47.5 CDE: 6.24   CHOLECYSTECTOMY  1970   COLON SURGERY  2013   blocked colon   COLONOSCOPY N/A 12/29/2019   Procedure: COLONOSCOPY;  Surgeon: Virgel Manifold, MD;  Location: [facility];  Service: Endoscopy;  Laterality: N/A;   COLONOSCOPY WITH PROPOFOL N/A 01/20/2018   Procedure: COLONOSCOPY WITH PROPOFOL;  Surgeon: Lucilla Lame, MD;  Location: [facility];  Service: Endoscopy;  Laterality: N/A;   ENDARTERECTOMY FEMORAL Left 07/08/2018   Procedure: ENDARTERECTOMY FEMORAL;  Surgeon: Algernon Huxley, MD;  Location: [facility];  Service: Vascular;  Laterality: Left;   EYE SURGERY Right    cataract surgery   FOOT FUSION Right 2018   metal in foot   [place]  2010   lost 178 lbs and regained 40 lbs last few years   HUMERUS FRACTURE SURGERY Right    metal plate with screws   internal bleeding  2016   ulcer in past   LOWER EXTREMITY ANGIOGRAM Left 07/08/2018   Procedure: LOWER EXTREMITY ANGIOGRAM;  Surgeon: Algernon Huxley, MD;  Location: [facility];  Service: Vascular;  Laterality: Left;   LOWER EXTREMITY ANGIOGRAPHY Left 05/27/2018   Procedure: LOWER EXTREMITY ANGIOGRAPHY;  Surgeon: Algernon Huxley, MD;  Location: [place] [facility];  Service: Cardiovascular;  Laterality: Left;   LOWER EXTREMITY ANGIOGRAPHY Right 12/07/2018   Procedure: LOWER EXTREMITY ANGIOGRAPHY;  Surgeon: Algernon Huxley, MD;  Location: [place] [facility];  Service: Cardiovascular;  Laterality: Right;   LOWER EXTREMITY ANGIOGRAPHY Left 05/10/2019   Procedure: LOWER EXTREMITY ANGIOGRAPHY;  Surgeon: Algernon Huxley, MD;  Location: [place] [facility];  Service: Cardiovascular;  Laterality: Left;   MOUTH SURGERY     root canals  and crowns and extractions   MOUTH SURGERY  09/30/2019   OVARY SURGERY     SKIN GRAFT Right 2018   RT foot. foot has been rebuilt.  it is full of metal   TENOTOMY ACHILLES TENDON Right    Percuntaneous. metal in foot   TONSILLECTOMY     VISCERAL ANGIOGRAPHY N/A 12/31/2019   Procedure: VISCERAL ANGIOGRAPHY;  Surgeon: Algernon Huxley, MD;  Location: [place] [facility];  Service: Cardiovascular;  Laterality: N/A;   WOUND DEBRIDEMENT Left 07/23/2018   Procedure: DEBRIDEMENT WOUND;  Surgeon: Algernon Huxley, MD;  Location: [facility];  Service: Vascular;  Laterality: Left;     Home Medications:  Prior to Admission medications   Medication Sig Start Date End Date Taking? Authorizing Provider  acetaminophen (TYLENOL) 325 MG tablet Take 2 tablets (650 mg total) by mouth every 6 (six) hours as needed for mild pain, moderate pain,  headache or fever. 12/31/19   Chatterjee, Srobona Tublu, MD  °albuterol (VENTOLIN HFA) 108 (90 Base) MCG/ACT inhaler INHALE 2 PUFFS INTO THE LUNGS EVERY 6 HOURS AS NEEDED FOR WHEEZING OR SHORTNESS OF BREATH 04/04/20   Tapia, Leisa, PA-C  °atorvastatin (LIPITOR) 10 MG tablet Take 1 tablet (10 mg total) by mouth at bedtime. 08/24/19   Tapia, Leisa, PA-C  °Cholecalciferol (VITAMIN D3) 250 MCG (10000 UT) TABS Take 10,000 Units by mouth daily.     [provider]  °clopidogrel (PLAVIX) 75 MG tablet TAKE 1 TABLET BY MOUTH ONCE A DAY 01/08/21   Elizaveta Mattice J, MD  °fluticasone (FLONASE) 50 MCG/ACT nasal spray Place 2 sprays into both nostrils daily as needed. 08/24/19   Tapia, Leisa, PA-C  °gabapentin (NEURONTIN) 400 MG capsule Take 1 capsule (400 mg total) by mouth 2 (two) times daily. 05/31/20   Tapia, Leisa, PA-C  °ipratropium-albuterol (DUONEB) 0.5-2.5 (3) MG/3ML SOLN Take 3 mLs by nebulization 3 (three) times daily as needed. °Patient taking differently: Take 3 mLs by nebulization 3 (three) times daily as needed (Shortness of breath).  05/24/19   Tapia, Leisa, PA-C  °ipratropium-albuterol  (DUONEB) 0.5-2.5 (3) MG/3ML SOLN Take 3 mLs by nebulization 3 (three) times daily as needed. 09/06/20 10/16/20  Menshew, Jenise V Bacon, PA-C  °lisinopril (ZESTRIL) 10 MG tablet Take 1 tablet (10 mg total) by mouth daily. 08/24/19   Tapia, Leisa, PA-C  °montelukast (SINGULAIR) 10 MG tablet Take 1 tablet (10 mg total) by mouth at bedtime. 08/24/19   Tapia, Leisa, PA-C  °Multiple Vitamins-Minerals (MULTIVITAMIN ADULTS 50+) TABS Take 1 tablet by mouth daily. 11/02/18   Poulose, Elizabeth E, NP  °pantoprazole (PROTONIX) 20 MG tablet Take 1 tablet (20 mg total) by mouth daily. 05/31/20   Tapia, Leisa, PA-C  °Respiratory Therapy Supplies (NEBULIZER/TUBING/MOUTHPIECE) KIT Disp one nebulizer machine, tubing set and mouthpiece kit °Patient not taking: Reported on 05/31/2020 05/24/19   Tapia, Leisa, PA-C  °sertraline (ZOLOFT) 100 MG tablet TAKE 1 AND 1/2 TABLETS (150 MG TOTAL) BY MOUTH DAILY °Patient taking differently: Take 100 mg by mouth daily.  08/24/19   Tapia, Leisa, PA-C  °umeclidinium-vilanterol (ANORO ELLIPTA) 62.5-25 MCG/INH AEPB Inhale 1 puff into the lungs daily. 08/24/19   Tapia, Leisa, PA-C  °varenicline (CHANTIX) 1 MG tablet  06/01/20   [provider]  °vitamin C (ASCORBIC ACID) 500 MG tablet Take 1,000 mg by mouth every other day.     [provider]  °zinc gluconate 50 MG tablet Take 50 mg by mouth daily.    [provider]  ° ° °Inpatient Medications: °Scheduled Meds: ° aspirin EC  81 mg Oral Daily  ° atorvastatin  40 mg Oral q1800  ° clopidogrel  75 mg Oral Q breakfast  ° furosemide  40 mg Intravenous Once  ° gabapentin  400 mg Oral BID  ° lisinopril  10 mg Oral Daily  ° montelukast  10 mg Oral QHS  ° sertraline  150 mg Oral Daily  ° sodium chloride flush  3 mL Intravenous Q12H  ° umeclidinium-vilanterol  1 puff Inhalation Daily  ° °Continuous Infusions: ° sodium chloride    ° heparin 900 Units/hr (07/22/21 1105)  ° °PRN Meds: °sodium chloride, acetaminophen, albuterol, fluticasone,  ipratropium-albuterol, nitroGLYCERIN, ondansetron (ZOFRAN) IV, sodium chloride flush ° °Allergies:    °Allergies  °Allergen Reactions  ° Meloxicam Other (See Comments)  °  GI bleeding  ° Nsaids Other (See Comments)  °  Gi bleeding  ° Sitagliptin Hives and   Itching    Januvia     Social History:   Social History   Socioeconomic History   Marital status: Divorced    Spouse name: Not on file   Number of children: 1   Years of education: Not on file   Highest education level: Associate degree: academic program  Occupational History   Occupation: Oceanographer    Comment: [employer]  Tobacco Use   Smoking status: Some Days    Packs/day: 0.50    Years: 55.00    Pack years: 27.50    Types: Cigarettes    Start date: 07/01/1960    Last attempt to quit: 11/22/2018    Years since quitting: 2.6   Smokeless tobacco: Never   Tobacco comments:    trying to quit again; wants to use chantix but unable to afford  Vaping Use   Vaping Use: Never used  Substance and Sexual Activity   Alcohol use: Yes    Alcohol/week: 0.0 standard drinks    Comment: 2 drinks per month   Drug use: Not Currently    Types: Marijuana    Comment: used for pain   Sexual activity: Not Currently  Other Topics Concern   Not on file  Social History Narrative   Not on file   Social Determinants of Health   Financial Resource Strain: Not on file  Food Insecurity: Not on file  Transportation Needs: Not on file  Physical Activity: Not on file  Stress: Not on file  Social Connections: Not on file  Intimate Partner Violence: Not on file    Family History:    Family History  Problem Relation Age of Onset   Asthma Mother    COPD Mother    Arthritis Father    Depression Father    Heart disease Father    Hypertension Father    Stroke Father    Heart attack Father    Arthritis Brother    Depression Brother    Diabetes Brother    Heart disease Brother    Hyperlipidemia Brother     Hypertension Brother    Stroke Brother    Vision loss Brother    Heart attack Brother    Diabetes Maternal Grandmother    Thyroid disease Daughter    Colitis Daughter    Breast cancer Neg Hx      ROS:  Please see the history of present illness.  Review of Systems  Constitutional: Negative.   HENT: Negative.    Respiratory:  Positive for shortness of breath.   Cardiovascular: Negative.   Gastrointestinal: Negative.   Musculoskeletal:  Positive for back pain.  Neurological: Negative.   Psychiatric/Behavioral: Negative.    All other systems reviewed and are negative.   Physical Exam/Data:   Vitals:   07/22/21 0600 07/22/21 0748 07/22/21 1150 07/22/21 1322  BP:  (!) 113/48 (!) 109/58   Pulse:  77 80   Resp:  17 17   Temp:  98.6 F (37 C) 97.9 F (36.6 C)   TempSrc:  Oral Oral   SpO2: 95% 93% 92% 96%  Weight:      Height:        Intake/Output Summary (Last 24 hours) at 07/22/2021 1504 Last data filed at 07/22/2021 1424 Gross per 24 hour  Intake 0 ml  Output --  Net 0 ml   Last 3 Weights 07/21/2021 03/28/2021 09/06/2020  Weight (lbs) 130 lb 145 lb 1 oz 145 lb  Weight (kg) 58.968  kg 65.8 kg 65.772 kg     Body mass index is 24.56 kg/m.  General:  Well nourished, well developed, in no acute distress HEENT: normal Neck: no JVD Vascular: No carotid bruits; Distal pulses 2+ bilaterally Cardiac:  normal S1, S2; RRR; no murmur  Lungs:  clear to auscultation bilaterally, no wheezing, rhonchi or rales  Abd: soft, nontender, no hepatomegaly  Ext: no edema Musculoskeletal:  No deformities, BUE and BLE strength normal and equal Skin: warm and dry  Neuro:  CNs 2-12 intact, no focal abnormalities noted Psych:  Normal affect   EKG:  The EKG was personally reviewed and demonstrates:   Normal sinus rhythm rate 73 bpm nonspecific ST abnormality, unable to exclude old anterior MI  Telemetry:  Telemetry was personally reviewed and demonstrates:  Normal sinus rhythm  Relevant  CV Studies: Echocardiogram pending  Laboratory Data:  High Sensitivity Troponin:   Recent Labs  Lab 07/21/21 2138 07/21/21 2329  TROPONINIHS 3,727* 4,188*     Chemistry Recent Labs  Lab 07/21/21 2138 07/22/21 0439  NA 126* 126*  K 3.8 3.9  CL 92* 95*  CO2 26 25  GLUCOSE 159* 128*  BUN 17 20  CREATININE 0.62 0.56  CALCIUM 9.0 9.0  GFRNONAA >60 >60  ANIONGAP 8 6    No results for input(s): PROT, ALBUMIN, AST, ALT, ALKPHOS, BILITOT in the last 168 hours. Lipids  Recent Labs  Lab 07/22/21 0439  CHOL 107  TRIG 58  HDL 46  LDLCALC 49  CHOLHDL 2.3    Hematology Recent Labs  Lab 07/21/21 2138 07/22/21 0439  WBC 12.0* 10.2  RBC 3.75* 3.52*  HGB 10.0* 9.7*  HCT 29.9* 28.1*  MCV 79.7* 79.8*  MCH 26.7 27.6  MCHC 33.4 34.5  RDW 14.4 14.6  PLT 204 194   Thyroid  Recent Labs  Lab 07/21/21 2329  TSH 1.232    BNP Recent Labs  Lab 07/21/21 2329  BNP 2,354.7*    DDimer No results for input(s): DDIMER in the last 168 hours.   Radiology/Studies:  DG Chest 2 View  Result Date: 07/21/2021 CLINICAL DATA:  Shortness of breath. EXAM: CHEST - 2 VIEW COMPARISON:  Chest radiograph dated 09/06/2020. FINDINGS: Background of emphysema. Diffuse interstitial prominence may be chronic or represent mild edema. There is blunting of the costophrenic angles which may represent small bilateral pleural effusions. No pneumothorax. The cardiac silhouette is within normal limits. Atherosclerotic calcification of the aorta. No acute osseous pathology. Degenerative changes of the spine. IMPRESSION: Emphysema with possible mild interstitial edema and small bilateral pleural effusions. Electronically Signed   By: Anner Crete M.D.   On: 07/21/2021 22:08     Assessment and Plan:   Non-STEMI High risk for underlying ischemia in the setting of long history of smoking, still smoking 1 pack/day, known severe PAD, diabetes, noncompliant with Lipitor -Presenting with back pain, neck pain,  shortness of breath, likely anginal equivalent -Troponin 2300, reports symptoms improved on heparin infusion Discussed various options with her, we have recommended cardiac catheterization given concern for unstable angina/non-STEMI --I have reviewed the risks, indications, and alternatives to cardiac catheterization, possible angioplasty, and stenting with the patient. Risks include but are not limited to bleeding, infection, vascular injury, stroke, myocardial infection, arrhythmia, kidney injury, radiation-related injury in the case of prolonged fluoroscopy use, emergency cardiac surgery, and death. The patient understands the risks of serious complication is 1-2 in 3354 with diagnostic cardiac cath and 1-2% or less with angioplasty/stenting.  -She is in  agreement, scheduled for third case tomorrow January 23 °On aspirin, Lipitor, Plavix °We will add low-dose beta-blocker ° °PAD °Prior intervention November 2020 °In July 2021, details above °Denies claudication symptoms ° °Smoker °We have encouraged her to continue to work on weaning her cigarettes and smoking cessation. She will continue to work on this and does not want any assistance with chantix.   °-Smoking patch ° °Weight loss °Weight down from 203 the past 2 to 3 years, °Reports no appetite ° ° Total encounter time more than 110 minutes ° Greater than 50% was spent in counseling and coordination of care with the patient ° ° ° °For questions or updates, please contact [facility] °Please consult [email or web address] for contact info under  ° ° °Signed, °Timothy Gollan, MD  °07/22/2021 3:04 PM ° °

## 2021-07-22 NOTE — Consult Note (Signed)
°Cardiology Consultation:  ° °Patient ID: Andrea Holden °MRN: [id number]; DOB: 08/27/1945 ° °Admit date: 07/21/2021 °Date of Consult: 07/22/2021 ° °PCP:  Miles, Linda M, MD °  °[facility] Providers °Cardiologist:  [facility] °Physician requesting consult: Dr. Malinda/Patel °Reason for consult: Non-STEMI ° ° °Patient Profile:  ° °Andrea Holden is a 76 y.o. female with a hx of smoking, COPD, diabetes, hyperlipidemia, severe PAD, history of left femoral endarterectomy, recent stenting aorta, lower extremity arterial with Dr. Dew, presenting with shortness of breath, back pain, non-STEMI ° °History of Present Illness:  ° °Ms. Andrea Holden reports she was in her usual state of health until 3 days ago, developed pain between her shoulder blades, significant shortness of breath.  Symptoms persisted for 1 to 2 days then back and neck pain has eased off to some degree but shortness of breath has persisted, °Reports unable to lay supine in bed, has been sleeping in a recliner ° she presented to the emergency room.  Initially felt she had strained something in her back but could not explain the shortness of breath and felt something was wrong. ° °Reports that she continues to smoke 1 pack/day ° °X-ray in emergency room with emphysema, small bilateral pleural effusions °Troponin 2300 °No chest CTA performed ° °This morning reports no significant back pain, mild shortness of breath at rest ° °Prior PAD history discussed with her °Prior intervention November 2020 °Percutaneous transluminal angioplasty of left tibioperoneal trunk and peroneal artery with 3 mm diameter angioplasty balloon °Percutaneous transluminal angioplasty of the left proximal SFA with 5 mm diameter by 15 cm Lutonix drug-coated angioplasty balloon °Viabahn stent placement to the proximal left SFA with 6 mm diameter by 10 cm length stent for greater than 50% residual stenosis after angioplasty ° °interventional procedure with Dr. Dew, 12/2019 ° Stent to  the SMA with 6 mm diameter x 29 mm length balloon expandable VBX stent postdilated with a 7 mm balloon proximally ° °Past Medical History:  °Diagnosis Date  ° Allergy   ° seasonal allergies  ° Anemia 2014  ° needed 5 units of blood d/t passing out, weak  ° Anxiety   ° Aortic stenosis 12/24/2017  ° Echo Aug 2018  ° Arthritis   ° Asthma   ° allergy induced asthma  ° Cardiac murmur 12/12/2017  ° Cataract   ° Cataract   ° left  ° Complication of anesthesia   ° arrhythmia following colonoscopy  ° Degenerative disc disease, lumbar   ° Degenerative disc disease, lumbar   ° Depression   ° Diabetes mellitus   ° Diabetic neuropathy (HCC) 11/16/2015  ° Dysrhythmia   ° stenosis of left ventricle  ° GERD (gastroesophageal reflux disease)   ° H/O transfusion   ° patient was given 5 units of blood while at [facility], blood type O+  ° History of chicken pox   ° History of hiatal hernia   ° History of measles, mumps, or rubella   ° HOH (hard of hearing)   ° does not use hearing aides yet  ° Hyperlipidemia   ° Hypertension   ° Irregular heartbeat   ° LVH (left ventricular hypertrophy) 12/24/2017  ° Echo Aug 2018  ° Neuropathy   ° NSTEMI (non-ST elevated myocardial infarction) (HCC) 07/21/2021  ° Opiate use 11/16/2015  ° Peripheral arterial disease (HCC) 05/06/2018  ° At rest, left > right; refer to vasc  ° Pes planus of both feet 11/16/2015  ° Plantar fasciitis of right foot 11/16/2015  °   Pneumonia    Wheezing     Past Surgical History:  Procedure Laterality Date   ABDOMINAL HYSTERECTOMY     APPLICATION OF WOUND VAC Left 07/23/2018   Procedure: APPLICATION OF WOUND VAC;  Surgeon: Algernon Huxley, MD;  Location: [facility];  Service: Vascular;  Laterality: Left;   banck injections     CATARACT EXTRACTION W/PHACO Right 05/30/2015   Procedure: CATARACT EXTRACTION PHACO AND INTRAOCULAR LENS PLACEMENT ([place]);  Surgeon: Birder Robson, MD;  Location: [facility];  Service: Ophthalmology;  Laterality: Right;  [place] 00:57 AP% 20.9 CDE 11.99 fluid  pack lot #[id number]   CATARACT EXTRACTION W/PHACO Left 11/11/2019   Procedure: CATARACT EXTRACTION PHACO AND INTRAOCULAR LENS PLACEMENT ([place]) LEFT DIABETIC;  Surgeon: Birder Robson, MD;  Location: [facility];  Service: Ophthalmology;  Laterality: Left;  Lot #[id number] H [place]: 00:47.5 CDE: 6.24   CHOLECYSTECTOMY  1970   COLON SURGERY  2013   blocked colon   COLONOSCOPY N/A 12/29/2019   Procedure: COLONOSCOPY;  Surgeon: Virgel Manifold, MD;  Location: [facility];  Service: Endoscopy;  Laterality: N/A;   COLONOSCOPY WITH PROPOFOL N/A 01/20/2018   Procedure: COLONOSCOPY WITH PROPOFOL;  Surgeon: Lucilla Lame, MD;  Location: [facility] END[facility];  Service: Endoscopy;  Laterality: N/A;   ENDARTERECTOMY FEMORAL Left 07/08/2018   Procedure: ENDARTERECTOMY FEMORAL;  Surgeon: Algernon Huxley, MD;  Location: [facility];  Service: Vascular;  Laterality: Left;   EYE SURGERY Right    cataract surgery   FOOT FUSION Right 2018   metal in foot   [place]  2010   lost 178 lbs and regained 40 lbs last few years   HUMERUS FRACTURE SURGERY Right    metal plate with screws   internal bleeding  2016   ulcer in past   LOWER EXTREMITY ANGIOGRAM Left 07/08/2018   Procedure: LOWER EXTREMITY ANGIOGRAM;  Surgeon: Algernon Huxley, MD;  Location: [facility];  Service: Vascular;  Laterality: Left;   LOWER EXTREMITY ANGIOGRAPHY Left 05/27/2018   Procedure: LOWER EXTREMITY ANGIOGRAPHY;  Surgeon: Algernon Huxley, MD;  Location: [place] [facility];  Service: Cardiovascular;  Laterality: Left;   LOWER EXTREMITY ANGIOGRAPHY Right 12/07/2018   Procedure: LOWER EXTREMITY ANGIOGRAPHY;  Surgeon: Algernon Huxley, MD;  Location: [place] [facility];  Service: Cardiovascular;  Laterality: Right;   LOWER EXTREMITY ANGIOGRAPHY Left 05/10/2019   Procedure: LOWER EXTREMITY ANGIOGRAPHY;  Surgeon: Algernon Huxley, MD;  Location: [place] [facility];  Service: Cardiovascular;  Laterality: Left;   MOUTH SURGERY     root canals  and crowns and extractions   MOUTH SURGERY  09/30/2019   OVARY SURGERY     SKIN GRAFT Right 2018   RT foot. foot has been rebuilt.  it is full of metal   TENOTOMY ACHILLES TENDON Right    Percuntaneous. metal in foot   TONSILLECTOMY     VISCERAL ANGIOGRAPHY N/A 12/31/2019   Procedure: VISCERAL ANGIOGRAPHY;  Surgeon: Algernon Huxley, MD;  Location: [place] [facility];  Service: Cardiovascular;  Laterality: N/A;   WOUND DEBRIDEMENT Left 07/23/2018   Procedure: DEBRIDEMENT WOUND;  Surgeon: Algernon Huxley, MD;  Location: [facility];  Service: Vascular;  Laterality: Left;     Home Medications:  Prior to Admission medications   Medication Sig Start Date End Date Taking? Authorizing Provider  acetaminophen (TYLENOL) 325 MG tablet Take 2 tablets (650 mg total) by mouth every 6 (six) hours as needed for mild pain, moderate pain,  headache or fever. 12/31/19   Chatterjee, Srobona Tublu, MD  °albuterol (VENTOLIN HFA) 108 (90 Base) MCG/ACT inhaler INHALE 2 PUFFS INTO THE LUNGS EVERY 6 HOURS AS NEEDED FOR WHEEZING OR SHORTNESS OF BREATH 04/04/20   Tapia, Leisa, PA-C  °atorvastatin (LIPITOR) 10 MG tablet Take 1 tablet (10 mg total) by mouth at bedtime. 08/24/19   Tapia, Leisa, PA-C  °Cholecalciferol (VITAMIN D3) 250 MCG (10000 UT) TABS Take 10,000 Units by mouth daily.     [provider]  °clopidogrel (PLAVIX) 75 MG tablet TAKE 1 TABLET BY MOUTH ONCE A DAY 01/08/21   Bari Handshoe J, MD  °fluticasone (FLONASE) 50 MCG/ACT nasal spray Place 2 sprays into both nostrils daily as needed. 08/24/19   Tapia, Leisa, PA-C  °gabapentin (NEURONTIN) 400 MG capsule Take 1 capsule (400 mg total) by mouth 2 (two) times daily. 05/31/20   Tapia, Leisa, PA-C  °ipratropium-albuterol (DUONEB) 0.5-2.5 (3) MG/3ML SOLN Take 3 mLs by nebulization 3 (three) times daily as needed. °Patient taking differently: Take 3 mLs by nebulization 3 (three) times daily as needed (Shortness of breath).  05/24/19   Tapia, Leisa, PA-C  °ipratropium-albuterol  (DUONEB) 0.5-2.5 (3) MG/3ML SOLN Take 3 mLs by nebulization 3 (three) times daily as needed. 09/06/20 10/16/20  Menshew, Jenise V Bacon, PA-C  °lisinopril (ZESTRIL) 10 MG tablet Take 1 tablet (10 mg total) by mouth daily. 08/24/19   Tapia, Leisa, PA-C  °montelukast (SINGULAIR) 10 MG tablet Take 1 tablet (10 mg total) by mouth at bedtime. 08/24/19   Tapia, Leisa, PA-C  °Multiple Vitamins-Minerals (MULTIVITAMIN ADULTS 50+) TABS Take 1 tablet by mouth daily. 11/02/18   Poulose, Elizabeth E, NP  °pantoprazole (PROTONIX) 20 MG tablet Take 1 tablet (20 mg total) by mouth daily. 05/31/20   Tapia, Leisa, PA-C  °Respiratory Therapy Supplies (NEBULIZER/TUBING/MOUTHPIECE) KIT Disp one nebulizer machine, tubing set and mouthpiece kit °Patient not taking: Reported on 05/31/2020 05/24/19   Tapia, Leisa, PA-C  °sertraline (ZOLOFT) 100 MG tablet TAKE 1 AND 1/2 TABLETS (150 MG TOTAL) BY MOUTH DAILY °Patient taking differently: Take 100 mg by mouth daily.  08/24/19   Tapia, Leisa, PA-C  °umeclidinium-vilanterol (ANORO ELLIPTA) 62.5-25 MCG/INH AEPB Inhale 1 puff into the lungs daily. 08/24/19   Tapia, Leisa, PA-C  °varenicline (CHANTIX) 1 MG tablet  06/01/20   [provider]  °vitamin C (ASCORBIC ACID) 500 MG tablet Take 1,000 mg by mouth every other day.     [provider]  °zinc gluconate 50 MG tablet Take 50 mg by mouth daily.    [provider]  ° ° °Inpatient Medications: °Scheduled Meds: ° aspirin EC  81 mg Oral Daily  ° atorvastatin  40 mg Oral q1800  ° clopidogrel  75 mg Oral Q breakfast  ° furosemide  40 mg Intravenous Once  ° gabapentin  400 mg Oral BID  ° lisinopril  10 mg Oral Daily  ° montelukast  10 mg Oral QHS  ° sertraline  150 mg Oral Daily  ° sodium chloride flush  3 mL Intravenous Q12H  ° umeclidinium-vilanterol  1 puff Inhalation Daily  ° °Continuous Infusions: ° sodium chloride    ° heparin 900 Units/hr (07/22/21 1105)  ° °PRN Meds: °sodium chloride, acetaminophen, albuterol, fluticasone,  ipratropium-albuterol, nitroGLYCERIN, ondansetron (ZOFRAN) IV, sodium chloride flush ° °Allergies:    °Allergies  °Allergen Reactions  ° Meloxicam Other (See Comments)  °  GI bleeding  ° Nsaids Other (See Comments)  °  Gi bleeding  ° Sitagliptin Hives and   Itching  °  Januvia   ° ° °Social History:   °Social History  ° °Socioeconomic History  ° Marital status: Divorced  °  Spouse name: Not on file  ° Number of children: 1  ° Years of education: Not on file  ° Highest education level: Associate degree: academic program  °Occupational History  ° Occupation: Substitute Teacher  °  Comment: [employer]  °Tobacco Use  ° Smoking status: Some Days  °  Packs/day: 0.50  °  Years: 55.00  °  Pack years: 27.50  °  Types: Cigarettes  °  Start date: 07/01/1960  °  Last attempt to quit: 11/22/2018  °  Years since quitting: 2.6  ° Smokeless tobacco: Never  ° Tobacco comments:  °  trying to quit again; wants to use chantix but unable to afford  °Vaping Use  ° Vaping Use: Never used  °Substance and Sexual Activity  ° Alcohol use: Yes  °  Alcohol/week: 0.0 standard drinks  °  Comment: 2 drinks per month  ° Drug use: Not Currently  °  Types: Marijuana  °  Comment: used for pain  ° Sexual activity: Not Currently  °Other Topics Concern  ° Not on file  °Social History Narrative  ° Not on file  ° °Social Determinants of Health  ° °Financial Resource Strain: Not on file  °Food Insecurity: Not on file  °Transportation Needs: Not on file  °Physical Activity: Not on file  °Stress: Not on file  °Social Connections: Not on file  °Intimate Partner Violence: Not on file  °  °Family History:   ° °Family History  °Problem Relation Age of Onset  ° Asthma Mother   ° COPD Mother   ° Arthritis Father   ° Depression Father   ° Heart disease Father   ° Hypertension Father   ° Stroke Father   ° Heart attack Father   ° Arthritis Brother   ° Depression Brother   ° Diabetes Brother   ° Heart disease Brother   ° Hyperlipidemia Brother    ° Hypertension Brother   ° Stroke Brother   ° Vision loss Brother   ° Heart attack Brother   ° Diabetes Maternal Grandmother   ° Thyroid disease Daughter   ° Colitis Daughter   ° Breast cancer Neg Hx   °  ° °ROS:  °Please see the history of present illness.  °Review of Systems  °Constitutional: Negative.   °HENT: Negative.    °Respiratory:  Positive for shortness of breath.   °Cardiovascular: Negative.   °Gastrointestinal: Negative.   °Musculoskeletal:  Positive for back pain.  °Neurological: Negative.   °Psychiatric/Behavioral: Negative.    °All other systems reviewed and are negative. ° ° °Physical Exam/Data:  ° °Vitals:  ° 07/22/21 0600 07/22/21 0748 07/22/21 1150 07/22/21 1322  °BP:  (!) 113/48 (!) 109/58   °Pulse:  77 80   °Resp:  17 17   °Temp:  98.6 °F (37 °C) 97.9 °F (36.6 °C)   °TempSrc:  Oral Oral   °SpO2: 95% 93% 92% 96%  °Weight:      °Height:      ° ° °Intake/Output Summary (Last 24 hours) at 07/22/2021 1504 °Last data filed at 07/22/2021 1424 °Gross per 24 hour  °Intake 0 ml  °Output --  °Net 0 ml  ° °Last 3 Weights 07/21/2021 03/28/2021 09/06/2020  °Weight (lbs) 130 lb 145 lb 1 oz 145 lb  °Weight (kg) 58.968   kg 65.8 kg 65.772 kg     Body mass index is 24.56 kg/m.  General:  Well nourished, well developed, in no acute distress HEENT: normal Neck: no JVD Vascular: No carotid bruits; Distal pulses 2+ bilaterally Cardiac:  normal S1, S2; RRR; no murmur  Lungs:  clear to auscultation bilaterally, no wheezing, rhonchi or rales  Abd: soft, nontender, no hepatomegaly  Ext: no edema Musculoskeletal:  No deformities, BUE and BLE strength normal and equal Skin: warm and dry  Neuro:  CNs 2-12 intact, no focal abnormalities noted Psych:  Normal affect   EKG:  The EKG was personally reviewed and demonstrates:   Normal sinus rhythm rate 73 bpm nonspecific ST abnormality, unable to exclude old anterior MI  Telemetry:  Telemetry was personally reviewed and demonstrates:  Normal sinus rhythm  Relevant  CV Studies: Echocardiogram pending  Laboratory Data:  High Sensitivity Troponin:   Recent Labs  Lab 07/21/21 2138 07/21/21 2329  TROPONINIHS 3,727* 4,188*     Chemistry Recent Labs  Lab 07/21/21 2138 07/22/21 0439  NA 126* 126*  K 3.8 3.9  CL 92* 95*  CO2 26 25  GLUCOSE 159* 128*  BUN 17 20  CREATININE 0.62 0.56  CALCIUM 9.0 9.0  GFRNONAA >60 >60  ANIONGAP 8 6    No results for input(s): PROT, ALBUMIN, AST, ALT, ALKPHOS, BILITOT in the last 168 hours. Lipids  Recent Labs  Lab 07/22/21 0439  CHOL 107  TRIG 58  HDL 46  LDLCALC 49  CHOLHDL 2.3    Hematology Recent Labs  Lab 07/21/21 2138 07/22/21 0439  WBC 12.0* 10.2  RBC 3.75* 3.52*  HGB 10.0* 9.7*  HCT 29.9* 28.1*  MCV 79.7* 79.8*  MCH 26.7 27.6  MCHC 33.4 34.5  RDW 14.4 14.6  PLT 204 194   Thyroid  Recent Labs  Lab 07/21/21 2329  TSH 1.232    BNP Recent Labs  Lab 07/21/21 2329  BNP 2,354.7*    DDimer No results for input(s): DDIMER in the last 168 hours.   Radiology/Studies:  DG Chest 2 View  Result Date: 07/21/2021 CLINICAL DATA:  Shortness of breath. EXAM: CHEST - 2 VIEW COMPARISON:  Chest radiograph dated 09/06/2020. FINDINGS: Background of emphysema. Diffuse interstitial prominence may be chronic or represent mild edema. There is blunting of the costophrenic angles which may represent small bilateral pleural effusions. No pneumothorax. The cardiac silhouette is within normal limits. Atherosclerotic calcification of the aorta. No acute osseous pathology. Degenerative changes of the spine. IMPRESSION: Emphysema with possible mild interstitial edema and small bilateral pleural effusions. Electronically Signed   By: Anner Crete M.D.   On: 07/21/2021 22:08     Assessment and Plan:   Non-STEMI High risk for underlying ischemia in the setting of long history of smoking, still smoking 1 pack/day, known severe PAD, diabetes, noncompliant with Lipitor -Presenting with back pain, neck pain,  shortness of breath, likely anginal equivalent -Troponin 2300, reports symptoms improved on heparin infusion Discussed various options with her, we have recommended cardiac catheterization given concern for unstable angina/non-STEMI --I have reviewed the risks, indications, and alternatives to cardiac catheterization, possible angioplasty, and stenting with the patient. Risks include but are not limited to bleeding, infection, vascular injury, stroke, myocardial infection, arrhythmia, kidney injury, radiation-related injury in the case of prolonged fluoroscopy use, emergency cardiac surgery, and death. The patient understands the risks of serious complication is 1-2 in 3354 with diagnostic cardiac cath and 1-2% or less with angioplasty/stenting.  -She is in  agreement, scheduled for third case tomorrow January 23 °On aspirin, Lipitor, Plavix °We will add low-dose beta-blocker ° °PAD °Prior intervention November 2020 °In July 2021, details above °Denies claudication symptoms ° °Smoker °We have encouraged her to continue to work on weaning her cigarettes and smoking cessation. She will continue to work on this and does not want any assistance with chantix.   °-Smoking patch ° °Weight loss °Weight down from 203 the past 2 to 3 years, °Reports no appetite ° ° Total encounter time more than 110 minutes ° Greater than 50% was spent in counseling and coordination of care with the patient ° ° ° °For questions or updates, please contact [facility] °Please consult [email or web address] for contact info under  ° ° °Signed, °Zaray Gatchel, MD  °07/22/2021 3:04 PM ° °

## 2021-07-22 NOTE — Progress Notes (Signed)
Progress Note    Andrea Holden  [id number] DOB: [birth year]  DOA: 07/21/2021 PCP: Marguerita Merles, MD      Brief Narrative:    Medical records reviewed and are as summarized below:  Andrea Holden is a 76 y.o. female with medical history significant for CAD (previous NSTEMI), arrhythmia, mild aortic stenosis, diabetes mellitus, depression, anxiety, pneumonia, peripheral vascular disease, GERD, degenerative disc disease of lumbar spine,      Assessment/Plan:   Principal Problem:   NSTEMI (non-ST elevated myocardial infarction) ([place]) Active Problems:   Diabetes mellitus without complication ([place])   Hypertension with heart disease   CAD (coronary artery disease)    Body mass index is 24.56 kg/m.  Acute NSTEMI: Troponin was up to 4,188.  Continue IV heparin drip and monitor heparin level per protocol.  Continue aspirin, Plavix and Lipitor.  Acute on chronic diastolic CHF, small bilateral pleural effusions: BNP was 2,354.  Treat with IV Lasix.  Monitor BMP, daily weight and urine output.  Hyponatremia: Chart review shows that this is chronic.  Monitor BMP.  COPD: Continue bronchodilators  Stage II decubitus ulcer on the left buttock: Present on admission.  Continue local wound care  Other comorbidities include type II DM, depression, anxiety, PVD  Diet Order             Diet Heart Room service appropriate? Yes; Fluid consistency: Thin  Diet effective now                      Consultants: Cardiologist  Procedures: None    Medications:    aspirin EC  81 mg Oral Daily   atorvastatin  40 mg Oral q1800   clopidogrel  75 mg Oral Q breakfast   furosemide  40 mg Intravenous Once   gabapentin  400 mg Oral BID   lisinopril  10 mg Oral Daily   montelukast  10 mg Oral QHS   sertraline  150 mg Oral Daily   sodium chloride flush  3 mL Intravenous Q12H   umeclidinium-vilanterol  1 puff Inhalation Daily   Continuous Infusions:  sodium  chloride     heparin 900 Units/hr (07/22/21 1105)     Anti-infectives (From admission, onward)    None              Family Communication/Anticipated D/C date and plan/Code Status   DVT prophylaxis:      Code Status: Full Code  Family Communication: None Disposition Plan: Plan to discharge home in 2 to 3 days   Status is: Inpatient  Remains inpatient appropriate because: IV heparin and plan for heart cath           Subjective:   C/o shortness of breath.  Interval events noted.  No chest pain.  She said she had upper back pain prior to admission but this is better.  Objective:    Vitals:   07/22/21 0600 07/22/21 0748 07/22/21 1150 07/22/21 1322  BP:  (!) 113/48 (!) 109/58   Pulse:  77 80   Resp:  17 17   Temp:  98.6 F (37 C) 97.9 F (36.6 C)   TempSrc:  Oral Oral   SpO2: 95% 93% 92% 96%  Weight:      Height:       No data found.  No intake or output data in the 24 hours ending 07/22/21 1341 Filed Weights   07/21/21 2135  Weight: 59 kg    Exam:  GEN: NAD SKIN: No rash.  Stage II left buttock decubitus ulcer EYES: EOMI ENT: MMM CV: RRR PULM: Bibasilar rales and wheezing ABD: soft, ND, NT, +BS CNS: AAO x 3, non focal EXT: No edema or tenderness    Pressure Injury 07/23/18 Stage II -  Partial thickness loss of dermis presenting as a shallow open ulcer with a red, pink wound bed without slough. pink, no drainage (Active)  07/23/18 1859  Location: Buttocks  Location Orientation: Left  Staging: Stage II -  Partial thickness loss of dermis presenting as a shallow open ulcer with a red, pink wound bed without slough.  Wound Description (Comments): pink, no drainage  Present on Admission: Yes     Data Reviewed:   I have personally reviewed following labs and imaging studies:  Labs: Labs show the following:   Basic Metabolic Panel: Recent Labs  Lab 07/21/21 2138 07/22/21 0439  NA 126* 126*  K 3.8 3.9  CL 92* 95*  CO2 26  25  GLUCOSE 159* 128*  BUN 17 20  CREATININE 0.62 0.56  CALCIUM 9.0 9.0   GFR Estimated Creatinine Clearance: 50.9 mL/min (by C-G formula based on SCr of 0.56 mg/dL). Liver Function Tests: No results for input(s): AST, ALT, ALKPHOS, BILITOT, PROT, ALBUMIN in the last 168 hours. No results for input(s): LIPASE, AMYLASE in the last 168 hours. No results for input(s): AMMONIA in the last 168 hours. Coagulation profile Recent Labs  Lab 07/21/21 2329 07/22/21 0439  INR 1.1 1.1    CBC: Recent Labs  Lab 07/21/21 2138 07/22/21 0439  WBC 12.0* 10.2  HGB 10.0* 9.7*  HCT 29.9* 28.1*  MCV 79.7* 79.8*  PLT 204 194   Cardiac Enzymes: No results for input(s): CKTOTAL, CKMB, CKMBINDEX, TROPONINI in the last 168 hours. BNP (last 3 results) No results for input(s): PROBNP in the last 8760 hours. CBG: No results for input(s): GLUCAP in the last 168 hours. D-Dimer: No results for input(s): DDIMER in the last 72 hours. Hgb A1c: No results for input(s): HGBA1C in the last 72 hours. Lipid Profile: Recent Labs    07/22/21 0439  CHOL 107  HDL 46  LDLCALC 49  TRIG 58  CHOLHDL 2.3   Thyroid function studies: Recent Labs    07/21/21 2329  TSH 1.232   Anemia work up: No results for input(s): VITAMINB12, FOLATE, FERRITIN, TIBC, IRON, RETICCTPCT in the last 72 hours. Sepsis Labs: Recent Labs  Lab 07/21/21 2138 07/22/21 0439  WBC 12.0* 10.2    Microbiology No results found for this or any previous visit (from the past 240 hour(s)).  Procedures and diagnostic studies:  DG Chest 2 View  Result Date: 07/21/2021 CLINICAL DATA:  Shortness of breath. EXAM: CHEST - 2 VIEW COMPARISON:  Chest radiograph dated 09/06/2020. FINDINGS: Background of emphysema. Diffuse interstitial prominence may be chronic or represent mild edema. There is blunting of the costophrenic angles which may represent small bilateral pleural effusions. No pneumothorax. The cardiac silhouette is within normal  limits. Atherosclerotic calcification of the aorta. No acute osseous pathology. Degenerative changes of the spine. IMPRESSION: Emphysema with possible mild interstitial edema and small bilateral pleural effusions. Electronically Signed   By: Elgie Collard M.D.   On: 07/21/2021 22:08               LOS: 1 day   Kamica Florance  [facility]   Pager on [email or web address]. If 7PM-7AM, please contact night-coverage at [email or web address]     07/22/2021, 1:41 PM

## 2021-07-22 NOTE — Progress Notes (Signed)
ANTICOAGULATION CONSULT NOTE  Pharmacy Consult for Heparin  Indication: chest pain/ACS  Allergies  Allergen Reactions   Meloxicam Other (See Comments)    GI bleeding   Nsaids Other (See Comments)    Gi bleeding   Sitagliptin Hives and Itching    Januvia     Patient Measurements: Height: 5\' 1"  (154.9 cm) Weight: 59 kg (130 lb) IBW/kg (Calculated) : 47.8 Heparin Dosing Weight: 59 kg   Vital Signs: Temp: 98.6 F (37 C) (01/22 0748) Temp Source: Oral (01/22 0748) BP: 113/48 (01/22 0748) Pulse Rate: 77 (01/22 0748)  Labs: Recent Labs    07/21/21 2138 07/21/21 2329 07/22/21 0439 07/22/21 0729  HGB 10.0*  --  9.7*  --   HCT 29.9*  --  28.1*  --   PLT 204  --  194  --   APTT  --  38*  --   --   LABPROT  --  14.3 14.2  --   INR  --  1.1 1.1  --   HEPARINUNFRC  --   --   --  <0.10*  CREATININE 0.62  --  0.56  --   TROPONINIHS 3,727* 4,188*  --   --      Estimated Creatinine Clearance: 50.9 mL/min (by C-G formula based on SCr of 0.56 mg/dL).   Medical History: Past Medical History:  Diagnosis Date   Allergy    seasonal allergies   Anemia 2014   needed 5 units of blood d/t passing out, weak   Anxiety    Aortic stenosis 12/24/2017   Echo Aug 2018   Arthritis    Asthma    allergy induced asthma   Cardiac murmur 12/12/2017   Cataract    Cataract    left   Complication of anesthesia    arrhythmia following colonoscopy   Degenerative disc disease, lumbar    Degenerative disc disease, lumbar    Depression    Diabetes mellitus    Diabetic neuropathy ([place]) 11/16/2015   Dysrhythmia    stenosis of left ventricle   GERD (gastroesophageal reflux disease)    H/O transfusion    patient was given 5 units of blood while at [place], blood type O+   History of chicken pox    History of hiatal hernia    History of measles, mumps, or rubella    HOH (hard of hearing)    does not use hearing aides yet   Hyperlipidemia    Hypertension    Irregular heartbeat    LVH  (left ventricular hypertrophy) 12/24/2017   Echo Aug 2018   Neuropathy    NSTEMI (non-ST elevated myocardial infarction) ([place]) 07/21/2021   Opiate use 11/16/2015   Peripheral arterial disease ([place]) 05/06/2018   At rest, left > right; refer to vasc   Pes planus of both feet 11/16/2015   Plantar fasciitis of right foot 11/16/2015   Pneumonia    Wheezing       Assessment: Pharmacy consulted to dose heparin in this 76 year old female admitted with ACS/NSTEMI.  No prior anticoag noted.  CrCl = 50.9 ml/min   Goal of Therapy:  Heparin level 0.3-0.7 units/ml Monitor platelets by anticoagulation protocol: Yes   Plan:  --HL < 0.10 subtherapeutic --Heparin 1700 unit bolus --Increase heparin infusion to 900 units/hr --CBC daily while on heparin infusion  Tawnya Crook, PharmD, BCPS Clinical Pharmacist 07/22/2021 9:51 AM

## 2021-07-22 NOTE — Progress Notes (Signed)
ANTICOAGULATION CONSULT NOTE  Pharmacy Consult for Heparin  Indication: chest pain/ACS  Allergies  Allergen Reactions   Meloxicam Other (See Comments)    GI bleeding   Nsaids Other (See Comments)    Gi bleeding   Sitagliptin Hives and Itching    Januvia     Patient Measurements: Height: 5\' 1"  (154.9 cm) Weight: 59 kg (130 lb) IBW/kg (Calculated) : 47.8 Heparin Dosing Weight: 59 kg   Vital Signs: Temp: 98.8 F (37.1 C) (01/22 2013) Temp Source: Oral (01/22 2013) BP: 138/82 (01/22 2013) Pulse Rate: 78 (01/22 2013)  Labs: Recent Labs    07/21/21 2138 07/21/21 2329 07/22/21 0439 07/22/21 0729 07/22/21 1750  HGB 10.0*  --  9.7*  --   --   HCT 29.9*  --  28.1*  --   --   PLT 204  --  194  --   --   APTT  --  38*  --   --   --   LABPROT  --  14.3 14.2  --   --   INR  --  1.1 1.1  --   --   HEPARINUNFRC  --   --   --  <0.10* <0.10*  CREATININE 0.62  --  0.56  --   --   TROPONINIHS 3,727* 4,188*  --   --   --      Estimated Creatinine Clearance: 50.9 mL/min (by C-G formula based on SCr of 0.56 mg/dL).   Medical History: Past Medical History:  Diagnosis Date   Allergy    seasonal allergies   Anemia 2014   needed 5 units of blood d/t passing out, weak   Anxiety    Aortic stenosis 12/24/2017   Echo Aug 2018   Arthritis    Asthma    allergy induced asthma   Cardiac murmur 12/12/2017   Cataract    Cataract    left   Complication of anesthesia    arrhythmia following colonoscopy   Degenerative disc disease, lumbar    Degenerative disc disease, lumbar    Depression    Diabetes mellitus    Diabetic neuropathy ([facility]) 11/16/2015   Dysrhythmia    stenosis of left ventricle   GERD (gastroesophageal reflux disease)    H/O transfusion    patient was given 5 units of blood while at [facility], blood type O+   History of chicken pox    History of hiatal hernia    History of measles, mumps, or rubella    HOH (hard of hearing)    does not use hearing aides yet    Hyperlipidemia    Hypertension    Irregular heartbeat    LVH (left ventricular hypertrophy) 12/24/2017   Echo Aug 2018   Neuropathy    NSTEMI (non-ST elevated myocardial infarction) ([facility]) 07/21/2021   Opiate use 11/16/2015   Peripheral arterial disease ([facility]) 05/06/2018   At rest, left > right; refer to vasc   Pes planus of both feet 11/16/2015   Plantar fasciitis of right foot 11/16/2015   Pneumonia    Wheezing       Assessment: Pharmacy consulted to dose heparin in this 75 year old female admitted with ACS/NSTEMI.  No prior anticoag noted.  CrCl = 50.9 ml/min   Date Time HL Rate/comment 1/22 0729 <0.10 600 un/hr 1/22 1750 <0.10 900 un/hr  Goal of Therapy:  Heparin level 0.3-0.7 units/ml Monitor platelets by anticoagulation protocol: Yes   Plan:  --HL < 0.10 subtherapeutic --Heparin  1700 unit bolus --Increase heparin infusion to 1150 units/hr --CBC daily while on heparin infusion  Derrek Gu, PharmD Clinical Pharmacist 07/22/2021 8:14 PM

## 2021-07-23 ENCOUNTER — Inpatient Hospital Stay (HOSPITAL_COMMUNITY)
Admit: 2021-07-23 | Discharge: 2021-07-23 | Disposition: A | Discharge status: Home / Self Care | Payer: Medicare Other | Attending: Cardiovascular Disease | Admitting: Cardiovascular Disease

## 2021-07-23 ENCOUNTER — Encounter: Payer: Self-pay | Admitting: Internal Medicine

## 2021-07-23 ENCOUNTER — Encounter
Admission: EM | Disposition: A | Discharge status: Other Acute Inpatient Hospital | Payer: Self-pay | Source: Home / Self Care | Attending: Internal Medicine

## 2021-07-23 DIAGNOSIS — Z720 Tobacco use: Secondary | ICD-10-CM

## 2021-07-23 DIAGNOSIS — R634 Abnormal weight loss: Secondary | ICD-10-CM

## 2021-07-23 DIAGNOSIS — I214 Non-ST elevation (NSTEMI) myocardial infarction: Secondary | ICD-10-CM

## 2021-07-23 DIAGNOSIS — I7389 Other specified peripheral vascular diseases: Secondary | ICD-10-CM

## 2021-07-23 HISTORY — PX: RIGHT/LEFT HEART CATH AND CORONARY ANGIOGRAPHY: CATH118266

## 2021-07-23 LAB — CBC
HCT: 27.5 % — ABNORMAL LOW (ref 36.0–46.0)
Hemoglobin: 9.2 g/dL — ABNORMAL LOW (ref 12.0–15.0)
MCH: 26.4 pg (ref 26.0–34.0)
MCHC: 33.5 g/dL (ref 30.0–36.0)
MCV: 79 fL — ABNORMAL LOW (ref 80.0–100.0)
Platelets: 181 10*3/uL (ref 150–400)
RBC: 3.48 MIL/uL — ABNORMAL LOW (ref 3.87–5.11)
RDW: 14.7 % (ref 11.5–15.5)
WBC: 9.3 10*3/uL (ref 4.0–10.5)
nRBC: 0 % (ref 0.0–0.2)

## 2021-07-23 LAB — BASIC METABOLIC PANEL
Anion gap: 8 (ref 5–15)
BUN: 24 mg/dL — ABNORMAL HIGH (ref 8–23)
CO2: 25 mmol/L (ref 22–32)
Calcium: 8.6 mg/dL — ABNORMAL LOW (ref 8.9–10.3)
Chloride: 94 mmol/L — ABNORMAL LOW (ref 98–111)
Creatinine, Ser: 0.68 mg/dL (ref 0.44–1.00)
GFR, Estimated: >60 mL/min (ref >60)
Glucose, Bld: 111 mg/dL — ABNORMAL HIGH (ref 70–99)
Potassium: 3.9 mmol/L (ref 3.5–5.1)
Sodium: 127 mmol/L — ABNORMAL LOW (ref 135–145)

## 2021-07-23 LAB — ECHOCARDIOGRAM COMPLETE
AR max vel: 0.57 cm2
AV Area VTI: 0.64 cm2
AV Area mean vel: 0.53 cm2
AV Mean grad: 34.3 mmHg
AV Peak grad: 51.2 mmHg
Ao pk vel: 3.58 m/s
Area-P 1/2: 3.45 cm2
Height: 61 in
MV VTI: 1.36 cm2
S' Lateral: 3.8 cm
Weight: 2080 oz

## 2021-07-23 LAB — HEPARIN LEVEL (UNFRACTIONATED): Heparin Unfractionated: 0.15 IU/mL — ABNORMAL LOW (ref 0.30–0.70)

## 2021-07-23 LAB — MAGNESIUM: Magnesium: 1.5 mg/dL — ABNORMAL LOW (ref 1.7–2.4)

## 2021-07-23 LAB — HEMOGLOBIN A1C
Hgb A1c MFr Bld: 6 % — ABNORMAL HIGH (ref 4.8–5.6)
Mean Plasma Glucose: 126 mg/dL

## 2021-07-23 LAB — GLUCOSE, CAPILLARY: Glucose-Capillary: 122 mg/dL — ABNORMAL HIGH (ref 70–99)

## 2021-07-23 SURGERY — RIGHT/LEFT HEART CATH AND CORONARY ANGIOGRAPHY
Anesthesia: Moderate Sedation

## 2021-07-23 MED ORDER — VERAPAMIL HCL 2.5 MG/ML IV SOLN
INTRAVENOUS | Status: AC
  Filled 2021-07-23: qty 2

## 2021-07-23 MED ORDER — HEPARIN (PORCINE) IN NACL 1000-0.9 UT/500ML-% IV SOLN
INTRAVENOUS | Status: AC
  Filled 2021-07-23: qty 1000

## 2021-07-23 MED ORDER — SODIUM CHLORIDE 0.9 % IV SOLN: 250.0000 mL | INTRAVENOUS | Status: DC | PRN

## 2021-07-23 MED ORDER — GERHARDT'S BUTT CREAM
TOPICAL_CREAM | Freq: Two times a day (BID) | CUTANEOUS | Status: DC
  Filled 2021-07-23: qty 1

## 2021-07-23 MED ORDER — NICOTINE 14 MG/24HR TD PT24: 14.0000 mg | MEDICATED_PATCH | Freq: Every day | TRANSDERMAL | 0 refills | Status: AC

## 2021-07-23 MED ORDER — MAGNESIUM SULFATE 2 GM/50ML IV SOLN
2.0000 g | Freq: Once | INTRAVENOUS | Status: AC
  Administered 2021-07-23: 2 g via INTRAVENOUS
  Filled 2021-07-23: qty 50

## 2021-07-23 MED ORDER — SODIUM CHLORIDE 0.9 % WEIGHT BASED INFUSION
1.0000 mL/kg/h | INTRAVENOUS | Status: DC
  Administered 2021-07-23: 1 mL/kg/h via INTRAVENOUS

## 2021-07-23 MED ORDER — HEPARIN (PORCINE) 25000 UT/250ML-% IV SOLN
1450.0000 [IU]/h | INTRAVENOUS | Status: DC
  Administered 2021-07-23: 1350 [IU]/h via INTRAVENOUS
  Administered 2021-07-24: 07:00:00 1450 [IU]/h via INTRAVENOUS
  Filled 2021-07-23 (×2): qty 250

## 2021-07-23 MED ORDER — HEPARIN (PORCINE) IN NACL 1000-0.9 UT/500ML-% IV SOLN
INTRAVENOUS | Status: DC | PRN
  Administered 2021-07-23 (×2): 500 mL

## 2021-07-23 MED ORDER — HEPARIN BOLUS VIA INFUSION
1800.0000 [IU] | Freq: Once | INTRAVENOUS | Status: AC
  Administered 2021-07-23: 1800 [IU] via INTRAVENOUS
  Filled 2021-07-23: qty 1800

## 2021-07-23 MED ORDER — ALBUTEROL SULFATE (2.5 MG/3ML) 0.083% IN NEBU
2.5000 mg | INHALATION_SOLUTION | Freq: Four times a day (QID) | RESPIRATORY_TRACT | Status: DC
  Administered 2021-07-23 (×3): 2.5 mg via RESPIRATORY_TRACT
  Filled 2021-07-23 (×3): qty 3

## 2021-07-23 MED ORDER — SODIUM CHLORIDE 0.9% FLUSH: 3.0000 mL | INTRAVENOUS | Status: DC | PRN

## 2021-07-23 MED ORDER — IOHEXOL 300 MG/ML  SOLN
INTRAMUSCULAR | Status: DC | PRN
  Administered 2021-07-23: 68 mL

## 2021-07-23 MED ORDER — HEPARIN SODIUM (PORCINE) 1000 UNIT/ML IJ SOLN
INTRAMUSCULAR | Status: AC
  Filled 2021-07-23: qty 10

## 2021-07-23 MED ORDER — FENTANYL CITRATE (PF) 100 MCG/2ML IJ SOLN
INTRAMUSCULAR | Status: AC
  Filled 2021-07-23: qty 2

## 2021-07-23 MED ORDER — ATORVASTATIN CALCIUM 40 MG PO TABS: 40.0000 mg | ORAL_TABLET | Freq: Every day | ORAL | Status: AC

## 2021-07-23 MED ORDER — SODIUM CHLORIDE 0.9% FLUSH
3.0000 mL | Freq: Two times a day (BID) | INTRAVENOUS | Status: DC
  Administered 2021-07-23: 3 mL via INTRAVENOUS

## 2021-07-23 MED ORDER — VERAPAMIL HCL 2.5 MG/ML IV SOLN
INTRAVENOUS | Status: DC | PRN
  Administered 2021-07-23: 2.5 mg via INTRA_ARTERIAL

## 2021-07-23 MED ORDER — HEPARIN SODIUM (PORCINE) 1000 UNIT/ML IJ SOLN
INTRAMUSCULAR | Status: DC | PRN
  Administered 2021-07-23: 3000 [IU] via INTRAVENOUS

## 2021-07-23 MED ORDER — ASPIRIN 81 MG PO TBEC: 81.0000 mg | DELAYED_RELEASE_TABLET | Freq: Every day | ORAL | Status: AC

## 2021-07-23 MED ORDER — METOPROLOL SUCCINATE ER 25 MG PO TB24: 12.5000 mg | ORAL_TABLET | Freq: Every day | ORAL | Status: AC

## 2021-07-23 MED ORDER — NICOTINE 14 MG/24HR TD PT24
14.0000 mg | MEDICATED_PATCH | Freq: Every day | TRANSDERMAL | Status: DC
  Administered 2021-07-23 – 2021-07-24 (×2): 14 mg via TRANSDERMAL
  Filled 2021-07-23 (×2): qty 1

## 2021-07-23 MED ORDER — MELATONIN 5 MG PO TABS
2.5000 mg | ORAL_TABLET | Freq: Once | ORAL | Status: AC
Start: 2021-07-23 — End: 2021-07-23
  Administered 2021-07-23: 2.5 mg via ORAL
  Filled 2021-07-23: qty 1

## 2021-07-23 MED ORDER — MIDAZOLAM HCL 2 MG/2ML IJ SOLN
INTRAMUSCULAR | Status: AC
  Filled 2021-07-23: qty 2

## 2021-07-23 MED ORDER — SODIUM CHLORIDE 0.9 % IV SOLN: INTRAVENOUS | Status: AC

## 2021-07-23 MED ORDER — FENTANYL CITRATE (PF) 100 MCG/2ML IJ SOLN
INTRAMUSCULAR | Status: DC | PRN
  Administered 2021-07-23: 25 ug via INTRAVENOUS

## 2021-07-23 MED ORDER — MIDAZOLAM HCL 2 MG/2ML IJ SOLN
INTRAMUSCULAR | Status: DC | PRN
  Administered 2021-07-23: .5 mg via INTRAVENOUS

## 2021-07-23 MED ORDER — SODIUM CHLORIDE 0.9 % WEIGHT BASED INFUSION
3.0000 mL/kg/h | INTRAVENOUS | Status: DC
  Administered 2021-07-23: 3 mL/kg/h via INTRAVENOUS

## 2021-07-23 SURGICAL SUPPLY — 14 items
CATH 5F 110X4 TIG (CATHETERS) ×1 IMPLANT
CATH BALLN WEDGE 5F 110CM (CATHETERS) ×1 IMPLANT
CATH INFINITI 5FR ANG PIGTAIL (CATHETERS) ×1 IMPLANT
DEVICE RAD TR BAND REGULAR (VASCULAR PRODUCTS) ×1 IMPLANT
DRAPE BRACHIAL (DRAPES) ×2 IMPLANT
GLIDESHEATH SLEND SS 6F .021 (SHEATH) ×1 IMPLANT
GUIDEWIRE INQWIRE 1.5J.035X260 (WIRE) IMPLANT
INQWIRE 1.5J .035X260CM (WIRE) ×2
PACK CARDIAC CATH (CUSTOM PROCEDURE TRAY) ×2 IMPLANT
PANNUS RETENTION SYSTEM 2 PAD (MISCELLANEOUS) ×1 IMPLANT
PROTECTION STATION PRESSURIZED (MISCELLANEOUS) ×2
SET ATX SIMPLICITY (MISCELLANEOUS) ×1 IMPLANT
SHEATH GLIDE SLENDER 4/5FR (SHEATH) ×1 IMPLANT
STATION PROTECTION PRESSURIZED (MISCELLANEOUS) IMPLANT

## 2021-07-23 NOTE — Progress Notes (Signed)
ANTICOAGULATION CONSULT NOTE  Pharmacy Consult for IV Heparin  Indication: chest pain/ACS  Patient Measurements: Height: 5\' 1"  (154.9 cm) Weight: 59 kg (130 lb) IBW/kg (Calculated) : 47.8 Heparin Dosing Weight: 59 kg   Labs: Recent Labs    07/21/21 2138 07/21/21 2329 07/22/21 0439 07/22/21 0729 07/22/21 1750 07/23/21 0430  HGB 10.0*  --  9.7*  --   --  9.2*  HCT 29.9*  --  28.1*  --   --  27.5*  PLT 204  --  194  --   --  181  APTT  --  38*  --   --   --   --   LABPROT  --  14.3 14.2  --   --   --   INR  --  1.1 1.1  --   --   --   HEPARINUNFRC  --   --   --  <0.10* <0.10* 0.15*  CREATININE 0.62  --  0.56  --   --  0.68  TROPONINIHS 3,727* 4,188*  --   --   --   --      Estimated Creatinine Clearance: 50.9 mL/min (by C-G formula based on SCr of 0.68 mg/dL).   Medical History: Past Medical History:  Diagnosis Date   Allergy    seasonal allergies   Anemia 2014   needed 5 units of blood d/t passing out, weak   Anxiety    Aortic stenosis 12/24/2017   Echo Aug 2018   Arthritis    Asthma    allergy induced asthma   Cardiac murmur 12/12/2017   Cataract    Cataract    left   Complication of anesthesia    arrhythmia following colonoscopy   Degenerative disc disease, lumbar    Degenerative disc disease, lumbar    Depression    Diabetes mellitus    Diabetic neuropathy ([place]) 11/16/2015   Dysrhythmia    stenosis of left ventricle   GERD (gastroesophageal reflux disease)    H/O transfusion    patient was given 5 units of blood while at [place], blood type O+   History of chicken pox    History of hiatal hernia    History of measles, mumps, or rubella    HOH (hard of hearing)    does not use hearing aides yet   Hyperlipidemia    Hypertension    Irregular heartbeat    LVH (left ventricular hypertrophy) 12/24/2017   Echo Aug 2018   Neuropathy    NSTEMI (non-ST elevated myocardial infarction) ([place]) 07/21/2021   Opiate use 11/16/2015   Peripheral arterial disease  ([place]) 05/06/2018   At rest, left > right; refer to vasc   Pes planus of both feet 11/16/2015   Plantar fasciitis of right foot 11/16/2015   Pneumonia    Wheezing     Assessment: Pharmacy consulted to dose heparin in this 76 year old female admitted with ACS/NSTEMI.  No prior anticoag noted.   Patient had cardiac catheterization 1/23 which showed critical left main stenosis. Plan for transfer to [facility] for CABG evaluation plus AVR versus left main stenting plus TAVR  Date Time HL Rate/comment 1/22 0729 <0.10 600 un/hr 1/22 1750 <0.10 900 un/hr 1/23     0430      0.15  1150 units/hr  Goal of Therapy:  Heparin level 0.3-0.7 units/ml Monitor platelets by anticoagulation protocol: Yes   Plan:  --Re-start heparin at previous rate of 1350 units/hr at 1630 per consult instructions --  Heparin level 8 hours after re-initiation of infusion --Daily CBC per protocol while on IV heparin  [name] 07/23/2021 2:07 PM

## 2021-07-23 NOTE — Consult Note (Incomplete)
Advanced Heart Failure Team Consult Note   Primary Physician: Marguerita Merles, MD PCP-Cardiologist:  Dr. Rockey Situ   Reason for Consultation: Acute Systolic Heart Failure; HF optimization prior to revascularization and AVR.   HPI:    Andrea Holden is seen today for evaluation of acute systolic heart failure and HF optimization prior to revascularization and AVR, at the request of Dr. Fletcher Anon, Cardiology.   76 y/o female w/ h/o tobacco abuse, extensive PAD s/p multiple lower arterial interventions, T2DM, HLD, COPD and AS (mild on echo 2019) admitted w/ SOB and back pain and ruled in for NSTEMI. Hs trop 3,727>>4,188>>4,188. Echo w/ moderate to severe LV dysfunction, EF 30-35% (previously 60-65%), RV normal + severe aortic stenosis with mean gradient of 34 mmHg and valve area of 0.44.  Underwent R/LHC showing critical left main stenosis, 95%, w/  significant calcifications. Prox-Mid LAD 70% stenosed.  No obstructive disease involving the right coronary artery. In addition to severe AS, also w/ moderate to severe mitral regurgitation (Prominent V waves on pulmonary wedge tracing suggestive of significant mitral regurgitation).  RHC w/ elevated filling pressures and low output. RAP 13, mPCWP 22, CO 3.11, CI 1.97L/min/m2.  Transferred to [facility] for further management/ optimization of HF and consultation for potential CABG + AVR vs PCI w/ TAVR.   Today's labs: SCr 0.94, BUN 33, Na 125 (Corrected sodium 129, Glucose 248), K 4.0. Hgb 10.8, WBC 16.3. Afebrile. She has been started on IV solumedrol for suspected ACOPDE.     Echo 07/23/21 Left ventricular ejection fraction, by estimation, is 30 to 35%. The left ventricle has moderately decreased function. The left ventricle demonstrates regional wall motion abnormalities (see scoring diagram/findings for description). There is mild left ventricular hypertrophy. Left ventricular diastolic parameters are consistent with Grade II diastolic dysfunction  (pseudonormalization). There is severe hypokinesis of the left ventricular, mid-apical anteroseptal wall, anterior wall, anterior segment and apical segment. 1. Right ventricular systolic function is normal. The right ventricular size is normal. There is mildly elevated pulmonary artery systolic pressure. 2. 3. Left atrial size was mildly dilated. The mitral valve is abnormal. Moderate mitral valve regurgitation. No evidence of mitral stenosis. Severe mitral annular calcification. 4. The aortic valve is calcified. Aortic valve regurgitation is not visualized. Severe aortic valve stenosis. Aortic valve area, by VTI measures 0.64 cm. Aortic valve mean gradient measures 34.2 mmHg.    R/LHC 07/23/21    Mid LM lesion is 95% stenosed.   Prox LAD to Mid LAD lesion is 70% stenosed.   Ost RCA to Prox RCA lesion is 20% stenosed.   There is moderate to severe left ventricular systolic dysfunction.   LV end diastolic pressure is moderately elevated.   The left ventricular ejection fraction is 25-35% by visual estimate.   1.  Critical left main stenosis with significant calcifications.  No obstructive disease involving the right coronary artery. 2.  Moderately to severely reduced LV systolic function with an EF of 30 to 35% with global hypokinesis.  Moderate to severe mitral regurgitation. 3.  Severe aortic stenosis with mean gradient of 34 mmHg and valve area of 0.44. 4.  Right heart catheterization showed moderately elevated wedge pressure, moderate pulmonary hypertension and severely reduced cardiac output.  Prominent V waves on pulmonary wedge tracing suggestive of significant mitral regurgitation.   Review of Systems: [y] = yes, '[ ]'  = no   General: Weight gain '[ ]' ; Weight loss '[ ]' ; Anorexia '[ ]' ; Fatigue '[ ]' ; Fever '[ ]' ;  Chills '[ ]' ; Weakness '[ ]'   Cardiac: Chest pain/pressure [ Y]; Resting SOB '[ ]' ; Exertional SOB [ Y]; Orthopnea '[ ]' ; Pedal Edema '[ ]' ; Palpitations '[ ]' ; Syncope '[ ]' ; Presyncope  '[ ]' ; Paroxysmal nocturnal dyspnea'[ ]'   Pulmonary: Cough '[ ]' ; Wheezing'[ ]' ; Hemoptysis'[ ]' ; Sputum '[ ]' ; Snoring '[ ]'   GI: Vomiting'[ ]' ; Dysphagia'[ ]' ; Melena'[ ]' ; Hematochezia '[ ]' ; Heartburn'[ ]' ; Abdominal pain '[ ]' ; Constipation '[ ]' ; Diarrhea '[ ]' ; BRBPR '[ ]'   GU: Hematuria'[ ]' ; Dysuria '[ ]' ; Nocturia'[ ]'   Vascular: Pain in legs with walking '[ ]' ; Pain in feet with lying flat '[ ]' ; Non-healing sores '[ ]' ; Stroke '[ ]' ; TIA '[ ]' ; Slurred speech '[ ]' ;  Neuro: Headaches'[ ]' ; Vertigo'[ ]' ; Seizures'[ ]' ; Paresthesias'[ ]' ;Blurred vision '[ ]' ; Diplopia '[ ]' ; Vision changes '[ ]'   Ortho/Skin: Arthritis '[ ]' ; Joint pain '[ ]' ; Muscle pain '[ ]' ; Joint swelling '[ ]' ; Back Pain '[ ]' ; Rash '[ ]'   Psych: Depression'[ ]' ; Anxiety'[ ]'   Heme: Bleeding problems '[ ]' ; Clotting disorders '[ ]' ; Anemia '[ ]'   Endocrine: Diabetes [ Y]; Thyroid dysfunction'[ ]'   Home Medications Prior to Admission medications   Medication Sig Start Date End Date Taking? Authorizing Provider  acetaminophen (TYLENOL) 325 MG tablet Take 2 tablets (650 mg total) by mouth every 6 (six) hours as needed for mild pain, moderate pain, headache or fever. 12/31/19   Vashti Hey, MD  albuterol (VENTOLIN HFA) 108 (90 Base) MCG/ACT inhaler INHALE 2 PUFFS INTO THE LUNGS EVERY 6 HOURS AS NEEDED FOR WHEEZING OR SHORTNESS OF BREATH 04/04/20   [name], PA-C  aspirin EC 81 MG EC tablet Take 1 tablet (81 mg total) by mouth daily. Swallow whole. 07/21/2021   Jennye Boroughs, MD  atorvastatin (LIPITOR) 10 MG tablet Take 1 tablet (10 mg total) by mouth at bedtime. 08/24/19   [name], PA-C  atorvastatin (LIPITOR) 40 MG tablet Take 1 tablet (40 mg total) by mouth daily at 6 PM. 07/23/21   Jennye Boroughs, MD  Cholecalciferol (VITAMIN D3) 250 MCG (10000 UT) TABS Take 10,000 Units by mouth daily.     [provider]  clopidogrel (PLAVIX) 75 MG tablet TAKE 1 TABLET BY MOUTH ONCE A DAY 01/08/21   Minna Merritts, MD  fluticasone (FLONASE) 50 MCG/ACT nasal spray Place 2 sprays into both  nostrils daily as needed. 08/24/19   [name], PA-C  gabapentin (NEURONTIN) 400 MG capsule Take 1 capsule (400 mg total) by mouth 2 (two) times daily. 05/31/20   [name], PA-C  ipratropium-albuterol (DUONEB) 0.5-2.5 (3) MG/3ML SOLN Take 3 mLs by nebulization 3 (three) times daily as needed. Patient taking differently: Take 3 mLs by nebulization 3 (three) times daily as needed (Shortness of breath).  05/24/19   [name], PA-C  ipratropium-albuterol (DUONEB) 0.5-2.5 (3) MG/3ML SOLN Take 3 mLs by nebulization 3 (three) times daily as needed. 09/06/20 10/16/20  Menshew, Dannielle Karvonen, PA-C  lisinopril (ZESTRIL) 10 MG tablet Take 1 tablet (10 mg total) by mouth daily. 08/24/19   [name], PA-C  metoprolol succinate (TOPROL-XL) 25 MG 24 hr tablet Take 0.5 tablets (12.5 mg total) by mouth daily. 07/13/2021   Jennye Boroughs, MD  montelukast (SINGULAIR) 10 MG tablet Take 1 tablet (10 mg total) by mouth at bedtime. 08/24/19   [name], PA-C  Multiple Vitamins-Minerals (MULTIVITAMIN ADULTS 50+) TABS Take 1 tablet by mouth daily. 11/02/18   Poulose, Bethel Born, NP  nicotine (NICODERM CQ - DOSED IN  MG/24 HOURS) 14 mg/24hr patch Place 1 patch (14 mg total) onto the skin daily. 07/15/2021   Jennye Boroughs, MD  pantoprazole (PROTONIX) 20 MG tablet Take 1 tablet (20 mg total) by mouth daily. 05/31/20   [name], PA-C  Respiratory Therapy Supplies (NEBULIZER/TUBING/MOUTHPIECE) KIT Disp one nebulizer machine, tubing set and mouthpiece kit Patient not taking: Reported on 05/31/2020 05/24/19   [name], PA-C  sertraline (ZOLOFT) 100 MG tablet TAKE 1 AND 1/2 TABLETS (150 MG TOTAL) BY MOUTH DAILY Patient taking differently: Take 100 mg by mouth daily.  08/24/19   [name], PA-C  umeclidinium-vilanterol (ANORO ELLIPTA) 62.5-25 MCG/INH AEPB Inhale 1 puff into the lungs daily. 08/24/19   [name], PA-C  varenicline (CHANTIX) 1 MG tablet  06/01/20   [provider]  vitamin C (ASCORBIC ACID) 500 MG  tablet Take 1,000 mg by mouth every other day.     [provider]  zinc gluconate 50 MG tablet Take 50 mg by mouth daily.    [provider]    Past Medical History: Past Medical History:  Diagnosis Date   Allergy    seasonal allergies   Anemia 2014   needed 5 units of blood d/t passing out, weak   Anxiety    Aortic stenosis 12/24/2017   Echo Aug 2018   Arthritis    Asthma    allergy induced asthma   Cardiac murmur 12/12/2017   Cataract    Cataract    left   Complication of anesthesia    arrhythmia following colonoscopy   Degenerative disc disease, lumbar    Degenerative disc disease, lumbar    Depression    Diabetes mellitus    Diabetic neuropathy ([place]) 11/16/2015   Dysrhythmia    stenosis of left ventricle   GERD (gastroesophageal reflux disease)    H/O transfusion    patient was given 5 units of blood while at [place], blood type O+   History of chicken pox    History of hiatal hernia    History of measles, mumps, or rubella    HOH (hard of hearing)    does not use hearing aides yet   Hyperlipidemia    Hypertension    Irregular heartbeat    LVH (left ventricular hypertrophy) 12/24/2017   Echo Aug 2018   Neuropathy    NSTEMI (non-ST elevated myocardial infarction) ([place]) 07/21/2021   Opiate use 11/16/2015   Peripheral arterial disease ([place]) 05/06/2018   At rest, left > right; refer to vasc   Pes planus of both feet 11/16/2015   Plantar fasciitis of right foot 11/16/2015   Pneumonia    Wheezing     Past Surgical History: Past Surgical History:  Procedure Laterality Date   ABDOMINAL HYSTERECTOMY     APPLICATION OF WOUND VAC Left 07/23/2018   Procedure: APPLICATION OF WOUND VAC;  Surgeon: Algernon Huxley, MD;  Location: [facility] ORS;  Service: Vascular;  Laterality: Left;   banck injections     CATARACT EXTRACTION W/PHACO Right 05/30/2015   Procedure: CATARACT EXTRACTION PHACO AND INTRAOCULAR LENS PLACEMENT ([place]);  Surgeon: Birder Robson, MD;   Location: [facility] ORS;  Service: Ophthalmology;  Laterality: Right;  [place] 00:57 AP% 20.9 CDE 11.99 fluid pack lot #[id number]   CATARACT EXTRACTION W/PHACO Left 11/11/2019   Procedure: CATARACT EXTRACTION PHACO AND INTRAOCULAR LENS PLACEMENT ([place]) LEFT DIABETIC;  Surgeon: Birder Robson, MD;  Location: [facility] ORS;  Service: Ophthalmology;  Laterality: Left;  Lot #[id number] H [place]: 00:47.5 CDE: 6.24  [place] [facility]  2013   blocked colon   COLONOSCOPY N/A 12/29/2019   Procedure: COLONOSCOPY;  Surgeon: Virgel Manifold, MD;  Location: [facility] ENDOSCOPY;  Service: Endoscopy;  Laterality: N/A;   COLONOSCOPY WITH PROPOFOL N/A 01/20/2018   Procedure: COLONOSCOPY WITH PROPOFOL;  Surgeon: Lucilla Lame, MD;  Location: [facility] ENDOSCOPY;  Service: Endoscopy;  Laterality: N/A;   ENDARTERECTOMY FEMORAL Left 07/08/2018   Procedure: ENDARTERECTOMY FEMORAL;  Surgeon: Algernon Huxley, MD;  Location: [facility] ORS;  Service: Vascular;  Laterality: Left;   EYE SURGERY Right    cataract surgery   FOOT FUSION Right 2018   metal in foot   [place]  2010   lost 178 lbs and regained 40 lbs last few years   HUMERUS FRACTURE SURGERY Right    metal plate with screws   internal bleeding  2016   ulcer in past   LOWER EXTREMITY ANGIOGRAM Left 07/08/2018   Procedure: LOWER EXTREMITY ANGIOGRAM;  Surgeon: Algernon Huxley, MD;  Location: [facility] ORS;  Service: Vascular;  Laterality: Left;   LOWER EXTREMITY ANGIOGRAPHY Left 05/27/2018   Procedure: LOWER EXTREMITY ANGIOGRAPHY;  Surgeon: Algernon Huxley, MD;  Location: [place] CV LAB;  Service: Cardiovascular;  Laterality: Left;   LOWER EXTREMITY ANGIOGRAPHY Right 12/07/2018   Procedure: LOWER EXTREMITY ANGIOGRAPHY;  Surgeon: Algernon Huxley, MD;  Location: [place] [facility];  Service: Cardiovascular;  Laterality: Right;   LOWER EXTREMITY ANGIOGRAPHY Left 05/10/2019   Procedure: LOWER EXTREMITY ANGIOGRAPHY;  Surgeon: Algernon Huxley, MD;   Location: [place] [facility];  Service: Cardiovascular;  Laterality: Left;   MOUTH SURGERY     root canals and crowns and extractions   MOUTH SURGERY  09/30/2019   OVARY SURGERY     SKIN GRAFT Right 2018   RT foot. foot has been rebuilt.  it is full of metal   TENOTOMY ACHILLES TENDON Right    Percuntaneous. metal in foot   TONSILLECTOMY     VISCERAL ANGIOGRAPHY N/A 12/31/2019   Procedure: VISCERAL ANGIOGRAPHY;  Surgeon: Algernon Huxley, MD;  Location: [place] [facility];  Service: Cardiovascular;  Laterality: N/A;   WOUND DEBRIDEMENT Left 07/23/2018   Procedure: DEBRIDEMENT WOUND;  Surgeon: Algernon Huxley, MD;  Location: [facility] ORS;  Service: Vascular;  Laterality: Left;    Family History: Family History  Problem Relation Age of Onset   Asthma Mother    COPD Mother    Arthritis Father    Depression Father    Heart disease Father    Hypertension Father    Stroke Father    Heart attack Father    Arthritis Brother    Depression Brother    Diabetes Brother    Heart disease Brother    Hyperlipidemia Brother    Hypertension Brother    Stroke Brother    Vision loss Brother    Heart attack Brother    Diabetes Maternal Grandmother    Thyroid disease Daughter    Colitis Daughter    Breast cancer Neg Hx     Social History: Social History   Socioeconomic History   Marital status: Divorced    Spouse name: Not on file   Number of children: 1   Years of education: Not on file   Highest education level: Associate degree: academic program  Occupational History   Occupation: Oceanographer    Comment: [employer]  Tobacco Use  Smoking status: Some Days    Packs/day: 0.50    Years: 55.00    Pack years: 27.50    Types: Cigarettes    Start date: 07/01/1960    Last attempt to quit: 11/22/2018    Years since quitting: 2.6   Smokeless tobacco: Never   Tobacco comments:    trying to quit again; wants to use chantix but unable to afford  Vaping Use    Vaping Use: Never used  Substance and Sexual Activity   Alcohol use: Yes    Alcohol/week: 0.0 standard drinks    Comment: 2 drinks per month   Drug use: Not Currently    Types: Marijuana    Comment: used for pain   Sexual activity: Not Currently  Other Topics Concern   Not on file  Social History Narrative   Not on file   Social Determinants of Health   Financial Resource Strain: Not on file  Food Insecurity: Not on file  Transportation Needs: Not on file  Physical Activity: Not on file  Stress: Not on file  Social Connections: Not on file    Allergies:  Allergies  Allergen Reactions   Meloxicam Other (See Comments)    GI bleeding   Nsaids Other (See Comments)    Gi bleeding   Sitagliptin Hives and Itching    Januvia     Objective:    Vital Signs:   Temp:  [98 F (36.7 C)-98.8 F (37.1 C)] 98 F (36.7 C) (01/23 1552) Pulse Rate:  [61-78] 70 (01/23 1552) Resp:  [16-20] 17 (01/23 1552) BP: (84-138)/(44-82) 84/44 (01/23 1552) SpO2:  [92 %-98 %] 98 % (01/23 1552) Last BM Date: 07/22/21  Weight change: Filed Weights   07/21/21 2135  Weight: 59 kg    Intake/Output:   Intake/Output Summary (Last 24 hours) at 07/23/2021 1609 Last data filed at 07/23/2021 0730 Gross per 24 hour  Intake 340.31 ml  Output 800 ml  Net -459.69 ml      Physical Exam    General:  Well appearing. No resp difficulty HEENT: normal Neck: supple. JVP . Carotids 2+ bilat; no bruits. No lymphadenopathy or thyromegaly appreciated. Cor: PMI nondisplaced. Regular rate & rhythm. No rubs, gallops or murmurs. Lungs: clear Abdomen: soft, nontender, nondistended. No hepatosplenomegaly. No bruits or masses. Good bowel sounds. Extremities: no cyanosis, clubbing, rash, edema Neuro: alert & orientedx3, cranial nerves grossly intact. moves all 4 extremities w/o difficulty. Affect pleasant   Telemetry   ***  EKG    NSR w/ PACs, 89 bpm lateral ST depressions, personally reviewed   Labs    Basic Metabolic Panel: Recent Labs  Lab 07/21/21 2138 07/22/21 0439 07/23/21 0430  NA 126* 126* 127*  K 3.8 3.9 3.9  CL 92* 95* 94*  CO2 '26 25 25  ' GLUCOSE 159* 128* 111*  BUN 17 20 24*  CREATININE 0.62 0.56 0.68  CALCIUM 9.0 9.0 8.6*  MG  --   --  1.5*    Liver Function Tests: No results for input(s): AST, ALT, ALKPHOS, BILITOT, PROT, ALBUMIN in the last 168 hours. No results for input(s): LIPASE, AMYLASE in the last 168 hours. No results for input(s): AMMONIA in the last 168 hours.  CBC: Recent Labs  Lab 07/21/21 2138 07/22/21 0439 07/23/21 0430  WBC 12.0* 10.2 9.3  HGB 10.0* 9.7* 9.2*  HCT 29.9* 28.1* 27.5*  MCV 79.7* 79.8* 79.0*  PLT 204 194 181    Cardiac Enzymes: No results for input(s): CKTOTAL, CKMB, CKMBINDEX,  TROPONINI in the last 168 hours.  BNP: BNP (last 3 results) Recent Labs    07/21/21 2329  BNP 2,354.7*    ProBNP (last 3 results) No results for input(s): PROBNP in the last 8760 hours.   CBG: Recent Labs  Lab 07/23/21 1118  GLUCAP 122*    Coagulation Studies: Recent Labs    07/21/21 2329 07/22/21 0439  LABPROT 14.3 14.2  INR 1.1 1.1     Imaging   CARDIAC CATHETERIZATION  Result Date: 07/23/2021   Mid LM lesion is 95% stenosed.   Prox LAD to Mid LAD lesion is 70% stenosed.   Ost RCA to Prox RCA lesion is 20% stenosed.   There is moderate to severe left ventricular systolic dysfunction.   LV end diastolic pressure is moderately elevated.   The left ventricular ejection fraction is 25-35% by visual estimate. 1.  Critical left main stenosis with significant calcifications.  No obstructive disease involving the right coronary artery. 2.  Moderately to severely reduced LV systolic function with an EF of 30 to 35% with global hypokinesis.  Moderate to severe mitral regurgitation. 3.  Severe aortic stenosis with mean gradient of 34 mmHg and valve area of 0.44. 4.  Right heart catheterization showed moderately elevated wedge pressure,  moderate pulmonary hypertension and severely reduced cardiac output.  Prominent V waves on pulmonary wedge tracing suggestive of significant mitral regurgitation. Recommendations: Transfer to [facility] for evaluation of CABG plus AVR versus left main stenting plus TAVR depending on candidacy for surgery.  She does have underlying COPD and also seem to have poor nutrition and weight loss and thus likely a high risk surgical candidate. Resume heparin at 4:30 PM.  Proceed with echocardiogram.   ECHOCARDIOGRAM COMPLETE  Result Date: 07/23/2021    ECHOCARDIOGRAM REPORT   Patient Name:   Andrea Holden [facility] Date of Exam: 07/23/2021 Medical Rec #:  [id number]          Height:       61.0 in Accession #:    [id number]         Weight:       130.0 lb Date of Birth:  Jun 23, 1946          BSA:          1.573 m Patient Age:    25 years           BP:           94/53 mmHg Patient Gender: F                  HR:           78 bpm. Exam Location:  [facility] Procedure: 2D Echo, Cardiac Doppler and Color Doppler Indications:     NSTEMI I21.4  History:         Patient has prior history of Echocardiogram examinations, most                  recent 06/04/2018. Signs/Symptoms:Murmur; Risk                  Factors:Hypertension.  Sonographer:     Sherrie Sport Referring Phys:  [place] Diagnosing Phys: Kathlyn Sacramento MD  Sonographer Comments: Suboptimal apical window. IMPRESSIONS  1. Left ventricular ejection fraction, by estimation, is 30 to 35%. The left ventricle has moderately decreased function. The left ventricle demonstrates regional wall motion abnormalities (see scoring diagram/findings for description). There is mild left ventricular hypertrophy. Left ventricular diastolic  parameters are consistent with Grade II diastolic dysfunction (pseudonormalization). There is severe hypokinesis of the left ventricular, mid-apical anteroseptal wall, anterior wall, anterior segment and apical segment.  2. Right ventricular systolic  function is normal. The right ventricular size is normal. There is mildly elevated pulmonary artery systolic pressure.  3. Left atrial size was mildly dilated.  4. The mitral valve is abnormal. Moderate mitral valve regurgitation. No evidence of mitral stenosis. Severe mitral annular calcification.  5. The aortic valve is calcified. Aortic valve regurgitation is not visualized. Severe aortic valve stenosis. Aortic valve area, by VTI measures 0.64 cm. Aortic valve mean gradient measures 34.2 mmHg. FINDINGS  Left Ventricle: Left ventricular ejection fraction, by estimation, is 30 to 35%. The left ventricle has moderately decreased function. The left ventricle demonstrates regional wall motion abnormalities. Severe hypokinesis of the left ventricular, mid-apical anteroseptal wall, anterior wall, anterior segment and apical segment. The left ventricular internal cavity size was normal in size. There is mild left ventricular hypertrophy. Left ventricular diastolic parameters are consistent with Grade II  diastolic dysfunction (pseudonormalization). Right Ventricle: The right ventricular size is normal. No increase in right ventricular wall thickness. Right ventricular systolic function is normal. There is mildly elevated pulmonary artery systolic pressure. The tricuspid regurgitant velocity is 3.14  m/s, and with an assumed right atrial pressure of 5 mmHg, the estimated right ventricular systolic pressure is 61.5 mmHg. Left Atrium: Left atrial size was mildly dilated. Right Atrium: Right atrial size was normal in size. Pericardium: There is no evidence of pericardial effusion. Mitral Valve: The mitral valve is abnormal. There is moderate thickening of the mitral valve leaflet(s). There is moderate calcification of the mitral valve leaflet(s). Severe mitral annular calcification. Moderate mitral valve regurgitation. No evidence  of mitral valve stenosis. MV peak gradient, 8.5 mmHg. The mean mitral valve gradient is 3.0  mmHg. Tricuspid Valve: The tricuspid valve is normal in structure. Tricuspid valve regurgitation is mild . No evidence of tricuspid stenosis. Aortic Valve: The aortic valve is calcified. Aortic valve regurgitation is not visualized. Severe aortic stenosis is present. Aortic valve mean gradient measures 34.2 mmHg. Aortic valve peak gradient measures 51.2 mmHg. Aortic valve area, by VTI measures  0.64 cm. Pulmonic Valve: The pulmonic valve was normal in structure. Pulmonic valve regurgitation is not visualized. No evidence of pulmonic stenosis. Aorta: The aortic root is normal in size and structure. Venous: The inferior vena cava was not well visualized. IAS/Shunts: No atrial level shunt detected by color flow Doppler.  LEFT VENTRICLE PLAX 2D LVIDd:         5.20 cm   Diastology LVIDs:         3.80 cm   LV e' medial:    4.24 cm/s LV PW:         1.00 cm   LV E/e' medial:  26.9 LV IVS:        0.95 cm   LV e' lateral:   3.81 cm/s LVOT diam:     2.00 cm   LV E/e' lateral: 29.9 LV SV:         56 LV SV Index:   35 LVOT Area:     3.14 cm  RIGHT VENTRICLE RV Basal diam:  4.35 cm RV S prime:     14.30 cm/s TAPSE (M-mode): 3.0 cm LEFT ATRIUM             Index        RIGHT ATRIUM  Index LA diam:        4.70 cm 2.99 cm/m   RA Area:     14.30 cm LA Vol (A2C):   75.6 ml 48.07 ml/m  RA Volume:   35.80 ml  22.76 ml/m LA Vol (A4C):   50.4 ml 32.05 ml/m LA Biplane Vol: 67.4 ml 42.85 ml/m  AORTIC VALVE                     PULMONIC VALVE AV Area (Vmax):    0.57 cm      PV Vmax:        0.92 m/s AV Area (Vmean):   0.53 cm      PV Vmean:       61.800 cm/s AV Area (VTI):     0.64 cm      PV VTI:         0.171 m AV Vmax:           357.75 cm/s   PV Peak grad:   3.4 mmHg AV Vmean:          281.000 cm/s  PV Mean grad:   2.0 mmHg AV VTI:            0.866 m       RVOT Peak grad: 4 mmHg AV Peak Grad:      51.2 mmHg AV Mean Grad:      34.2 mmHg LVOT Vmax:         64.60 cm/s LVOT Vmean:        47.800 cm/s LVOT VTI:          0.177 m  LVOT/AV VTI ratio: 0.20  AORTA Ao Root diam: 3.00 cm MITRAL VALVE                TRICUSPID VALVE MV Area (PHT): 3.45 cm     TR Peak grad:   39.4 mmHg MV Area VTI:   1.36 cm     TR Vmax:        314.00 cm/s MV Peak grad:  8.5 mmHg MV Mean grad:  3.0 mmHg     SHUNTS MV Vmax:       1.46 m/s     Systemic VTI:  0.18 m MV Vmean:      78.6 cm/s    Systemic Diam: 2.00 cm MV Decel Time: 220 msec     Pulmonic VTI:  0.192 m MV E velocity: 114.00 cm/s MV A velocity: 96.40 cm/s MV E/A ratio:  1.18 Kathlyn Sacramento MD Electronically signed by Kathlyn Sacramento MD Signature Date/Time: 07/23/2021/2:01:59 PM    Final      Medications:     Current Medications:  albuterol  2.5 mg Nebulization Q6H   aspirin EC  81 mg Oral Daily   atorvastatin  40 mg Oral q1800   gabapentin  400 mg Oral BID   Gerhardt's butt cream   Topical BID   lisinopril  10 mg Oral Daily   metoprolol succinate  12.5 mg Oral Daily   montelukast  10 mg Oral QHS   nicotine  14 mg Transdermal Daily   sertraline  150 mg Oral Daily   sodium chloride flush  3 mL Intravenous Q12H   sodium chloride flush  3 mL Intravenous Q12H   umeclidinium-vilanterol  1 puff Inhalation Daily    Infusions:  sodium chloride     sodium chloride     sodium chloride     heparin 1,350 Units/hr (07/23/21 1600)  Patient Profile   76 y/o female smoker w/ PAD, COPD, Type 2DM and HLD admitted w/ NSTEMI w/ critical LM disease on cath, new systolic heart failure w/ low output, severe aortic stenosis, mod-severe MR. Transferred from [facility] for further management/ optimization of HF and consultation for potential CABG + AVR vs PCI w/ TAVR.   Assessment/Plan   CAD - NSTEMI, HS trop Hs trop 3,727>>4,188>>4,188 - LHC w/ critical, 95%, LM stenosis + 70% p-mLAD disease - CT consult for potential CABG w/ AVR vs high risk PCI w/ TAVR (suspect she would be high risk surgical candidate given other commodities and poor nutritional status) - continue heparin gtt - ASA + high  dose statin - Stop ? blocker (metoprolol) w/ low output   2. Severe Aortic Stenosis  - mean gradient 34 mmHg and valve area of 0.44. EF 30-35% - CT + structural heart team eval for SAVR vs TAVR  - Avoid hypotension, stop lisinopril and metoprolol   3. Acute Systolic Heart Failure>> Low Output - Echo 2019, EF normal 60-65%, RV normal - EF now 30-35%, RV normal - ICM + valvular HD. Obstructive CAD w/ critical LM + LAD disease. Severe AS  - RHC w/ elevated filling pressures and low output, CI 1.97L/min/m2. - MCS w/ Impella may be contraindicated w/ severe AS. ?BAV followed by Impella placement vs inotropic support  - plan placement of swan + a-line for continuous hemodynamic monitoring  4. Hypomagnesemia - Mg 1.5 at [facility] (supp MgSO4 given) - repeat level, additional supp if needed  5. Hyponatremia - Hypervolemic hyponatremia - Na 125 (Corrected sodium 129, Glucose 248) - diurese and fluid restrict - Tolvaptan if < 125    6. COPD  - noted in history - no record of PFTs in chart  - Acute exacerbation expected, started on IV solumedrol   7. Tobacco Abuse - Smoking cessation imperative  - nicotine patch   8. HLD, LDL Goal < 70   - lipids at [facility], LDL 49 mg/dL - continue atorvastatin 40     Length of Stay: 2  Kendon Sedeno, PA-C  07/23/2021, 4:09 PM  Advanced Heart Failure Team Pager [phone] (M-F; 7a - 5p)  Please contact [facility] for night-coverage after hours (4p -7a ) and weekends on [email or web address]

## 2021-07-23 NOTE — Progress Notes (Signed)
°  Transition of Care [facility]) Screening Note   Patient Details  Name: Andrea Holden Date of Birth: 1945/10/29   Transition of Care [facility]) CM/SW Contact:    Gildardo Griffes, LCSW Phone Number: 07/23/2021, 8:56 AM    Transition of Care Department [facility]) has reviewed patient and no TOC needs have been identified at this time. We will continue to monitor patient advancement through interdisciplinary progression rounds. If new patient transition needs arise, please place a TOC consult.  [place], [place] [phone]

## 2021-07-23 NOTE — Interval H&P Note (Signed)
Cath Lab Visit (complete for each Cath Lab visit)  Clinical Evaluation Leading to the Procedure:   ACS: Yes.    Non-ACS:  n/a    History and Physical Interval Note:  07/23/2021 11:40 AM  Andrea Holden  has presented today for surgery, with the diagnosis of nstemi.  The various methods of treatment have been discussed with the patient and family. After consideration of risks, benefits and other options for treatment, the patient has consented to  Procedure(s): LEFT HEART CATH AND CORONARY ANGIOGRAPHY (N/A) as a surgical intervention.  The patient's history has been reviewed, patient examined, no change in status, stable for surgery.  I have reviewed the patient's chart and labs.  Questions were answered to the patient's satisfaction.     [name]

## 2021-07-23 NOTE — Progress Notes (Signed)
*  PRELIMINARY RESULTS* Echocardiogram 2D Echocardiogram has been performed.  Andrea Holden 07/23/2021, 1:43 PM

## 2021-07-23 NOTE — Consult Note (Incomplete)
HEART AND [place] VALVE TEAM  Cardiology Consultation:   Patient ID: Andrea Holden MRN: [id number]; DOB: 1945/10/31  Admit date: 07/21/2021 Date of Consult: 07/23/2021  Primary Care Provider: Marguerita Merles, MD [facility] HeartCare Cardiologist: Ida Rogue, MD  [facility] HeartCare Electrophysiologist:  None   Patient Profile:   Andrea Holden is a 76 y.o. female with a hx of PAD s/p multiple lower extremity PTAs/stents, mesenteric ischemia/ischemic colitis, HLD, DMII, COPD with ongoing tobacco abuse, and HL who is being seen today for the evaluation of severe aortic stenosis at the request of Dr. Fletcher Anon.  History of Present Illness:   Andrea Holden      Andrea Holden presented to [facility] with SOB and back pain found to rule in for NSTEMI with HsT peak at 4188. Echocardiogram showed LV dysfunction with an LVEF at 30-35% with severe hypokinesis of the left ventricular, mid-apical, anteroseptal wall (previously 60-65%), normal RV, mod MR, and severe aortic stenosis with a mean gradient at 60mmHg, peak  47mmHg, SVI 35, DI 0.20, and AVA by VTI at 0.64cm2. She was admitted per cardiology service and was placed on IV heparin and Lasix. Cardiac catheterization performed 07/23/21 showed critical left main stenosis at 95% w/  significant calcifications, prox-mid LAD 70% stenosis, and normal RCA. There was severe AS with mean gradient confirmed at 34 mmHg and valve area of 0.44, moderate to severe mitral regurgitation with prominent V waves on pulmonary wedge tracing suggestive of significant mitral regurgitation, and RHC findings showing elevated filling pressures and low output. RAP 13, mPCWP 22, CO 3.11, CI 1.97L/min/m2. Plan at that time was to transfer her care to [facility] for CABG/AVR evaluation versus high risk PCI/TAVR with structural heart and advanced heart failure evaluations.    Unfortunately, due to limited bed availability, her transfer has been delayed.     Past Medical History:  Diagnosis Date   Allergy    seasonal allergies   Anemia 2014   needed 5 units of blood d/t passing out, weak   Anxiety    Arthritis    Asthma    allergy induced asthma   CAD (coronary artery disease)    a. 05/2018 MV: EF 82%, no ischemia/infarct; b. 07/2021 NSTEMI/Cath: LM 58m, LAD 70p/m, LCX min irregs, RCA 20ost/p. AoV mean grad of 46mmHg w/ AVA of 0.44. EF 25-35%.   Cardiac murmur 12/12/2017   Cataract    left   Complication of anesthesia    arrhythmia following colonoscopy   Degenerative disc disease, lumbar    Depression    Diabetes mellitus    Diabetic neuropathy ([place]) 11/16/2015   GERD (gastroesophageal reflux disease)    H/O transfusion    patient was given 5 units of blood while at [place], blood type O+   HFrEF (heart failure with reduced ejection fraction) ([place])    a. 05/2018 Echo: EF 60-65%; b. 07/2021 Echo: EF 30-35%.   History of chicken pox    History of hiatal hernia    History of measles, mumps, or rubella    HOH (hard of hearing)    does not use hearing aides yet   Hyperlipidemia    Hypertension    Irregular heartbeat    Ischemic cardiomyopathy    a. 05/2018 Echo: EF 60-65%; b. 07/2021 Echo: EF 30-35%. GrII DD, sev mid-apical/anteroseptal/ant/apical HK.   Ischemic colitis ([place])    LVH (left ventricular hypertrophy) 12/24/2017   Echo Aug 2018   Mesenteric ischemia ([place])  a. 12/2019 s/p PTA/stenting of SMA w/ 6x29 balloon expandable VBX stent.   Moderate mitral regurgitation    Neuropathy    NSTEMI (non-ST elevated myocardial infarction) ([place]) 07/21/2021   Opiate use 11/16/2015   Peripheral arterial disease ([place])    a. 05/2018 s/p PTA/DCBA of L CFA; b. 11/2018 PTAR TP trunk and prox peroneal, DCBA R SFA & prox pop, Viabahn stenting to mid/distal R SFA; c. 05/2019 DCBA/stenting L SFA.   Pes planus of both feet 11/16/2015   Plantar fasciitis of right foot 11/16/2015   Pneumonia    Severe aortic stenosis    a. 05/2018 Echo: EF  60-65%, GrI DD, mild AS (mean grad 82mmHg, AVA 1.31cm^2); b. 07/2021 Echo: Ef 30-35%, mild LVH, GrII DD, sev mid-apical/anteroseptal/ant/apical HK. Nl RV fxn. Mildly dil LA. Mod MR. Sev AS (mean grad 34.61mmHg, AVA 0.64 cm^2).   Wheezing     Past Surgical History:  Procedure Laterality Date   ABDOMINAL HYSTERECTOMY     APPLICATION OF WOUND VAC Left 07/23/2018   Procedure: APPLICATION OF WOUND VAC;  Surgeon: Algernon Huxley, MD;  Location: [facility] ORS;  Service: Vascular;  Laterality: Left;   banck injections     CATARACT EXTRACTION W/PHACO Right 05/30/2015   Procedure: CATARACT EXTRACTION PHACO AND INTRAOCULAR LENS PLACEMENT ([place]);  Surgeon: Birder Robson, MD;  Location: [facility] ORS;  Service: Ophthalmology;  Laterality: Right;  [place] 00:57 AP% 20.9 CDE 11.99 fluid pack lot #[id number]   CATARACT EXTRACTION W/PHACO Left 11/11/2019   Procedure: CATARACT EXTRACTION PHACO AND INTRAOCULAR LENS PLACEMENT ([place]) LEFT DIABETIC;  Surgeon: Birder Robson, MD;  Location: [facility] ORS;  Service: Ophthalmology;  Laterality: Left;  Lot [id number] [place]: 00:47.5 CDE: 6.24   CHOLECYSTECTOMY  1970   COLON SURGERY  2013   blocked colon   COLONOSCOPY N/A 12/29/2019   Procedure: COLONOSCOPY;  Surgeon: Virgel Manifold, MD;  Location: [facility] ENDOSCOPY;  Service: Endoscopy;  Laterality: N/A;   COLONOSCOPY WITH PROPOFOL N/A 01/20/2018   Procedure: COLONOSCOPY WITH PROPOFOL;  Surgeon: Lucilla Lame, MD;  Location: [facility] ENDOSCOPY;  Service: Endoscopy;  Laterality: N/A;   ENDARTERECTOMY FEMORAL Left 07/08/2018   Procedure: ENDARTERECTOMY FEMORAL;  Surgeon: Algernon Huxley, MD;  Location: [facility] ORS;  Service: Vascular;  Laterality: Left;   EYE SURGERY Right    cataract surgery   FOOT FUSION Right 2018   metal in foot   [place]  2010   lost 178 lbs and regained 40 lbs last few years   HUMERUS FRACTURE SURGERY Right    metal plate with screws   internal bleeding  2016   ulcer in past   LOWER  EXTREMITY ANGIOGRAM Left 07/08/2018   Procedure: LOWER EXTREMITY ANGIOGRAM;  Surgeon: Algernon Huxley, MD;  Location: [facility] ORS;  Service: Vascular;  Laterality: Left;   LOWER EXTREMITY ANGIOGRAPHY Left 05/27/2018   Procedure: LOWER EXTREMITY ANGIOGRAPHY;  Surgeon: Algernon Huxley, MD;  Location: [place] CV LAB;  Service: Cardiovascular;  Laterality: Left;   LOWER EXTREMITY ANGIOGRAPHY Right 12/07/2018   Procedure: LOWER EXTREMITY ANGIOGRAPHY;  Surgeon: Algernon Huxley, MD;  Location: [place] CV LAB;  Service: Cardiovascular;  Laterality: Right;   LOWER EXTREMITY ANGIOGRAPHY Left 05/10/2019   Procedure: LOWER EXTREMITY ANGIOGRAPHY;  Surgeon: Algernon Huxley, MD;  Location: [place] CV LAB;  Service: Cardiovascular;  Laterality: Left;   MOUTH SURGERY     root canals and crowns and extractions   MOUTH SURGERY  09/30/2019  OVARY SURGERY     SKIN GRAFT Right 2018   RT foot. foot has been rebuilt.  it is full of metal   TENOTOMY ACHILLES TENDON Right    Percuntaneous. metal in foot   TONSILLECTOMY     VISCERAL ANGIOGRAPHY N/A 12/31/2019   Procedure: VISCERAL ANGIOGRAPHY;  Surgeon: Algernon Huxley, MD;  Location: [place] CV LAB;  Service: Cardiovascular;  Laterality: N/A;   WOUND DEBRIDEMENT Left 07/23/2018   Procedure: DEBRIDEMENT WOUND;  Surgeon: Algernon Huxley, MD;  Location: [facility] ORS;  Service: Vascular;  Laterality: Left;     {Home Medications (Optional):21181}  Inpatient Medications: Scheduled Meds:  albuterol  2.5 mg Nebulization Q6H   aspirin EC  81 mg Oral Daily   atorvastatin  40 mg Oral q1800   gabapentin  400 mg Oral BID   Gerhardt's butt cream   Topical BID   lisinopril  10 mg Oral Daily   metoprolol succinate  12.5 mg Oral Daily   montelukast  10 mg Oral QHS   nicotine  14 mg Transdermal Daily   sertraline  150 mg Oral Daily   sodium chloride flush  3 mL Intravenous Q12H   sodium chloride flush  3 mL Intravenous Q12H   umeclidinium-vilanterol  1 puff Inhalation Daily    Continuous Infusions:  sodium chloride     sodium chloride     sodium chloride     heparin 1,350 Units/hr (07/23/21 1600)   PRN Meds: sodium chloride, sodium chloride, acetaminophen, fluticasone, ipratropium-albuterol, nitroGLYCERIN, ondansetron (ZOFRAN) IV, sodium chloride flush, sodium chloride flush  Allergies:    Allergies  Allergen Reactions   Meloxicam Other (See Comments)    GI bleeding   Nsaids Other (See Comments)    Gi bleeding   Sitagliptin Hives and Itching    Januvia     Social History:   Social History   Socioeconomic History   Marital status: Divorced    Spouse name: Not on file   Number of children: 1   Years of education: Not on file   Highest education level: Associate degree: academic program  Occupational History   Occupation: Oceanographer    Comment: [employer]  Tobacco Use   Smoking status: Some Days    Packs/day: 0.50    Years: 55.00    Pack years: 27.50    Types: Cigarettes    Start date: 07/01/1960    Last attempt to quit: 11/22/2018    Years since quitting: 2.6   Smokeless tobacco: Never   Tobacco comments:    trying to quit again; wants to use chantix but unable to afford  Vaping Use   Vaping Use: Never used  Substance and Sexual Activity   Alcohol use: Yes    Alcohol/week: 0.0 standard drinks    Comment: 2 drinks per month   Drug use: Not Currently    Types: Marijuana    Comment: used for pain   Sexual activity: Not Currently  Other Topics Concern   Not on file  Social History Narrative   Not on file   Social Determinants of Health   Financial Resource Strain: Not on file  Food Insecurity: Not on file  Transportation Needs: Not on file  Physical Activity: Not on file  Stress: Not on file  Social Connections: Not on file  Intimate Partner Violence: Not on file    Family History:   *** Family History  Problem Relation Age of Onset   Asthma Mother  COPD Mother    Arthritis Father     Depression Father    Heart disease Father    Hypertension Father    Stroke Father    Heart attack Father    Arthritis Brother    Depression Brother    Diabetes Brother    Heart disease Brother    Hyperlipidemia Brother    Hypertension Brother    Stroke Brother    Vision loss Brother    Heart attack Brother    Diabetes Maternal Grandmother    Thyroid disease Daughter    Colitis Daughter    Breast cancer Neg Hx      ROS:  Please see the history of present illness.  *** All other ROS reviewed and negative.     Physical Exam/Data:   Vitals:   07/23/21 1042 07/23/21 1110 07/23/21 1136 07/23/21 1552  BP:  98/62  (!) 84/44  Pulse: 68 62  70  Resp: 20 17  17   Temp:  98.1 F (36.7 C)  98 F (36.7 C)  TempSrc:  Oral    SpO2: 92% 92% 97% 98%  Weight:      Height:  5\' 1"  (1.549 m)      Intake/Output Summary (Last 24 hours) at 07/23/2021 1641 Last data filed at 07/23/2021 0730 Gross per 24 hour  Intake 340.31 ml  Output 800 ml  Net -459.69 ml   Last 3 Weights 07/21/2021 03/28/2021 09/06/2020  Weight (lbs) 130 lb 145 lb 1 oz 145 lb  Weight (kg) 58.968 kg 65.8 kg 65.772 kg     Body mass index is 24.56 kg/m.  General:  Well nourished, well developed, in no acute distress*** HEENT: normal Lymph: no adenopathy Neck: no JVD Endocrine:  No thryomegaly Vascular: No carotid bruits; FA pulses 2+ bilaterally without bruits  Cardiac:  normal S1, S2; RRR; no murmur *** Lungs:  clear to auscultation bilaterally, no wheezing, rhonchi or rales  Abd: soft, nontender, no hepatomegaly  Ext: no edema Musculoskeletal:  No deformities, BUE and BLE strength normal and equal Skin: warm and dry  Neuro:  CNs 2-12 intact, no focal abnormalities noted Psych:  Normal affect   EKG:  The EKG was personally reviewed and demonstrates:  NSR with PACs, evidence of LVH, HR 68bpm.  Telemetry:  Telemetry was personally reviewed and demonstrates:  ***  Relevant CV Studies:  [facility] 07/23/21:    Mid  LM lesion is 95% stenosed.   Prox LAD to Mid LAD lesion is 70% stenosed.   Ost RCA to Prox RCA lesion is 20% stenosed.   There is moderate to severe left ventricular systolic dysfunction.   LV end diastolic pressure is moderately elevated.   The left ventricular ejection fraction is 25-35% by visual estimate.   1.  Critical left main stenosis with significant calcifications.  No obstructive disease involving the right coronary artery. 2.  Moderately to severely reduced LV systolic function with an EF of 30 to 35% with global hypokinesis.  Moderate to severe mitral regurgitation. 3.  Severe aortic stenosis with mean gradient of 34 mmHg and valve area of 0.44. 4.  Right heart catheterization showed moderately elevated wedge pressure, moderate pulmonary hypertension and severely reduced cardiac output.  Prominent V waves on pulmonary wedge tracing suggestive of significant mitral regurgitation.   Recommendations: Transfer to [facility] for evaluation of CABG plus AVR versus left main stenting plus TAVR depending on candidacy for surgery.  She does have underlying COPD and also seem to have  poor nutrition and weight loss and thus likely a high risk surgical candidate. Resume heparin at 4:30 PM.  Proceed with echocardiogram.  Diagnostic Dominance: Right         Echocardiogram 07/23/21:   1. Left ventricular ejection fraction, by estimation, is 30 to 35%. The  left ventricle has moderately decreased function. The left ventricle  demonstrates regional wall motion abnormalities (see scoring  diagram/findings for description). There is mild  left ventricular hypertrophy. Left ventricular diastolic parameters are  consistent with Grade II diastolic dysfunction (pseudonormalization).  There is severe hypokinesis of the left ventricular, mid-apical  anteroseptal wall, anterior wall, anterior  segment and apical segment.   2. Right ventricular systolic function is normal. The right  ventricular  size is normal. There is mildly elevated pulmonary artery systolic  pressure.   3. Left atrial size was mildly dilated.   4. The mitral valve is abnormal. Moderate mitral valve regurgitation. No  evidence of mitral stenosis. Severe mitral annular calcification.   5. The aortic valve is calcified. Aortic valve regurgitation is not  visualized. Severe aortic valve stenosis. Aortic valve area, by VTI  measures 0.64 cm. Aortic valve mean gradient measures 34.2 mmHg.   FINDINGS   Left Ventricle: Left ventricular ejection fraction, by estimation, is 30  to 35%. The left ventricle has moderately decreased function. The left  ventricle demonstrates regional wall motion abnormalities. Severe  hypokinesis of the left ventricular,  mid-apical anteroseptal wall, anterior wall, anterior segment and apical  segment. The left ventricular internal cavity size was normal in size.  There is mild left ventricular hypertrophy. Left ventricular diastolic  parameters are consistent with Grade II   diastolic dysfunction (pseudonormalization).   Right Ventricle: The right ventricular size is normal. No increase in  right ventricular wall thickness. Right ventricular systolic function is  normal. There is mildly elevated pulmonary artery systolic pressure. The  tricuspid regurgitant velocity is 3.14   m/s, and with an assumed right atrial pressure of 5 mmHg, the estimated  right ventricular systolic pressure is [id number] mmHg.   Left Atrium: Left atrial size was mildly dilated.   Right Atrium: Right atrial size was normal in size.   Pericardium: There is no evidence of pericardial effusion.   Mitral Valve: The mitral valve is abnormal. There is moderate thickening  of the mitral valve leaflet(s). There is moderate calcification of the  mitral valve leaflet(s). Severe mitral annular calcification. Moderate  mitral valve regurgitation. No evidence   of mitral valve stenosis. MV peak gradient, 8.5  mmHg. The mean mitral  valve gradient is 3.0 mmHg.   Tricuspid Valve: The tricuspid valve is normal in structure. Tricuspid  valve regurgitation is mild . No evidence of tricuspid stenosis.   Aortic Valve: The aortic valve is calcified. Aortic valve regurgitation is  not visualized. Severe aortic stenosis is present. Aortic valve mean  gradient measures 34.2 mmHg. Aortic valve peak gradient measures 51.2  mmHg. Aortic valve area, by VTI measures   0.64 cm.   Pulmonic Valve: The pulmonic valve was normal in structure. Pulmonic valve  regurgitation is not visualized. No evidence of pulmonic stenosis.   Aorta: The aortic root is normal in size and structure.   Venous: The inferior vena cava was not well visualized.   IAS/Shunts: No atrial level shunt detected by color flow Doppler.      LEFT VENTRICLE  PLAX 2D  LVIDd:         5.20 cm   Diastology  LVIDs:  3.80 cm   LV e' medial:    4.24 cm/s  LV PW:         1.00 cm   LV E/e' medial:  26.9  LV IVS:        0.95 cm   LV e' lateral:   3.81 cm/s  LVOT diam:     2.00 cm   LV E/e' lateral: 29.9  LV SV:         56  LV SV Index:   35  LVOT Area:     3.14 cm      RIGHT VENTRICLE  RV Basal diam:  4.35 cm  RV S prime:     14.30 cm/s  TAPSE (M-mode): 3.0 cm   LEFT ATRIUM             Index        RIGHT ATRIUM           Index  LA diam:        4.70 cm 2.99 cm/m   RA Area:     14.30 cm  LA Vol (A2C):   75.6 ml 48.07 ml/m  RA Volume:   35.80 ml  22.76 ml/m  LA Vol (A4C):   50.4 ml 32.05 ml/m  LA Biplane Vol: 67.4 ml 42.85 ml/m   AORTIC VALVE                     PULMONIC VALVE  AV Area (Vmax):    0.57 cm      PV Vmax:        0.92 m/s  AV Area (Vmean):   0.53 cm      PV Vmean:       61.800 cm/s  AV Area (VTI):     0.64 cm      PV VTI:         0.171 m  AV Vmax:           357.75 cm/s   PV Peak grad:   3.4 mmHg  AV Vmean:          281.000 cm/s  PV Mean grad:   2.0 mmHg  AV VTI:            0.866 m       RVOT Peak grad: 4 mmHg   AV Peak Grad:      51.2 mmHg  AV Mean Grad:      34.2 mmHg  LVOT Vmax:         64.60 cm/s  LVOT Vmean:        47.800 cm/s  LVOT VTI:          0.177 m  LVOT/AV VTI ratio: 0.20     AORTA  Ao Root diam: 3.00 cm   MITRAL VALVE                TRICUSPID VALVE  MV Area (PHT): 3.45 cm     TR Peak grad:   39.4 mmHg  MV Area VTI:   1.36 cm     TR Vmax:        314.00 cm/s  MV Peak grad:  8.5 mmHg  MV Mean grad:  3.0 mmHg     SHUNTS  MV Vmax:       1.46 m/s     Systemic VTI:  0.18 m  MV Vmean:      78.6 cm/s    Systemic Diam: 2.00 cm  MV Decel Time: 220 msec  Pulmonic VTI:  0.192 m  MV E velocity: 114.00 cm/s  MV A velocity: 96.40 cm/s  MV E/A ratio:  1.18  Laboratory Data:  High Sensitivity Troponin:   Recent Labs  Lab 07/21/21 2138 07/21/21 2329  TROPONINIHS 3,727* 4,188*     Chemistry Recent Labs  Lab 07/21/21 2138 07/22/21 0439 07/23/21 0430  NA 126* 126* 127*  K 3.8 3.9 3.9  CL 92* 95* 94*  CO2 26 25 25   GLUCOSE 159* 128* 111*  BUN 17 20 24*  CREATININE 0.62 0.56 0.68  CALCIUM 9.0 9.0 8.6*  GFRNONAA >60 >60 >60  ANIONGAP 8 6 8     No results for input(s): PROT, ALBUMIN, AST, ALT, ALKPHOS, BILITOT in the last 168 hours. Hematology Recent Labs  Lab 07/21/21 2138 07/22/21 0439 07/23/21 0430  WBC 12.0* 10.2 9.3  RBC 3.75* 3.52* 3.48*  HGB 10.0* 9.7* 9.2*  HCT 29.9* 28.1* 27.5*  MCV 79.7* 79.8* 79.0*  MCH 26.7 27.6 26.4  MCHC 33.4 34.5 33.5  RDW 14.4 14.6 14.7  PLT 204 194 181   BNP Recent Labs  Lab 07/21/21 2329  BNP 2,354.7*    DDimer No results for input(s): DDIMER in the last 168 hours.   Radiology/Studies:  DG Chest 2 View  Result Date: 07/21/2021 CLINICAL DATA:  Shortness of breath. EXAM: CHEST - 2 VIEW COMPARISON:  Chest radiograph dated 09/06/2020. FINDINGS: Background of emphysema. Diffuse interstitial prominence may be chronic or represent mild edema. There is blunting of the costophrenic angles which may represent small bilateral  pleural effusions. No pneumothorax. The cardiac silhouette is within normal limits. Atherosclerotic calcification of the aorta. No acute osseous pathology. Degenerative changes of the spine. IMPRESSION: Emphysema with possible mild interstitial edema and small bilateral pleural effusions. Electronically Signed   By: Anner Crete M.D.   On: 07/21/2021 22:08   CARDIAC CATHETERIZATION  Result Date: 07/23/2021   Mid LM lesion is 95% stenosed.   Prox LAD to Mid LAD lesion is 70% stenosed.   Ost RCA to Prox RCA lesion is 20% stenosed.   There is moderate to severe left ventricular systolic dysfunction.   LV end diastolic pressure is moderately elevated.   The left ventricular ejection fraction is 25-35% by visual estimate. 1.  Critical left main stenosis with significant calcifications.  No obstructive disease involving the right coronary artery. 2.  Moderately to severely reduced LV systolic function with an EF of 30 to 35% with global hypokinesis.  Moderate to severe mitral regurgitation. 3.  Severe aortic stenosis with mean gradient of 34 mmHg and valve area of 0.44. 4.  Right heart catheterization showed moderately elevated wedge pressure, moderate pulmonary hypertension and severely reduced cardiac output.  Prominent V waves on pulmonary wedge tracing suggestive of significant mitral regurgitation. Recommendations: Transfer to [facility] for evaluation of CABG plus AVR versus left main stenting plus TAVR depending on candidacy for surgery.  She does have underlying COPD and also seem to have poor nutrition and weight loss and thus likely a high risk surgical candidate. Resume heparin at 4:30 PM.  Proceed with echocardiogram.   ECHOCARDIOGRAM COMPLETE  Result Date: 07/23/2021    ECHOCARDIOGRAM REPORT   Patient Name:   Andrea Holden [facility] Date of Exam: 07/23/2021 Medical Rec #:  [id number]          Height:       61.0 in Accession #:    [id number]         Weight:  130.0 lb Date of Birth:   Jul 27, 1945          BSA:          1.573 m Patient Age:    71 years           BP:           94/53 mmHg Patient Gender: F                  HR:           78 bpm. Exam Location:  [facility] Procedure: 2D Echo, Cardiac Doppler and Color Doppler Indications:     NSTEMI I21.4  History:         Patient has prior history of Echocardiogram examinations, most                  recent 06/04/2018. Signs/Symptoms:Murmur; Risk                  Factors:Hypertension.  Sonographer:     Sherrie Sport Referring Phys:  [place] Diagnosing Phys: Kathlyn Sacramento MD  Sonographer Comments: Suboptimal apical window. IMPRESSIONS  1. Left ventricular ejection fraction, by estimation, is 30 to 35%. The left ventricle has moderately decreased function. The left ventricle demonstrates regional wall motion abnormalities (see scoring diagram/findings for description). There is mild left ventricular hypertrophy. Left ventricular diastolic parameters are consistent with Grade II diastolic dysfunction (pseudonormalization). There is severe hypokinesis of the left ventricular, mid-apical anteroseptal wall, anterior wall, anterior segment and apical segment.  2. Right ventricular systolic function is normal. The right ventricular size is normal. There is mildly elevated pulmonary artery systolic pressure.  3. Left atrial size was mildly dilated.  4. The mitral valve is abnormal. Moderate mitral valve regurgitation. No evidence of mitral stenosis. Severe mitral annular calcification.  5. The aortic valve is calcified. Aortic valve regurgitation is not visualized. Severe aortic valve stenosis. Aortic valve area, by VTI measures 0.64 cm. Aortic valve mean gradient measures 34.2 mmHg. FINDINGS  Left Ventricle: Left ventricular ejection fraction, by estimation, is 30 to 35%. The left ventricle has moderately decreased function. The left ventricle demonstrates regional wall motion abnormalities. Severe hypokinesis of the left ventricular, mid-apical  anteroseptal wall, anterior wall, anterior segment and apical segment. The left ventricular internal cavity size was normal in size. There is mild left ventricular hypertrophy. Left ventricular diastolic parameters are consistent with Grade II  diastolic dysfunction (pseudonormalization). Right Ventricle: The right ventricular size is normal. No increase in right ventricular wall thickness. Right ventricular systolic function is normal. There is mildly elevated pulmonary artery systolic pressure. The tricuspid regurgitant velocity is 3.14  m/s, and with an assumed right atrial pressure of 5 mmHg, the estimated right ventricular systolic pressure is [id number] mmHg. Left Atrium: Left atrial size was mildly dilated. Right Atrium: Right atrial size was normal in size. Pericardium: There is no evidence of pericardial effusion. Mitral Valve: The mitral valve is abnormal. There is moderate thickening of the mitral valve leaflet(s). There is moderate calcification of the mitral valve leaflet(s). Severe mitral annular calcification. Moderate mitral valve regurgitation. No evidence  of mitral valve stenosis. MV peak gradient, 8.5 mmHg. The mean mitral valve gradient is 3.0 mmHg. Tricuspid Valve: The tricuspid valve is normal in structure. Tricuspid valve regurgitation is mild . No evidence of tricuspid stenosis. Aortic Valve: The aortic valve is calcified. Aortic valve regurgitation is not visualized. Severe aortic stenosis is present. Aortic valve mean gradient measures 34.2 mmHg. Aortic  valve peak gradient measures 51.2 mmHg. Aortic valve area, by VTI measures  0.64 cm. Pulmonic Valve: The pulmonic valve was normal in structure. Pulmonic valve regurgitation is not visualized. No evidence of pulmonic stenosis. Aorta: The aortic root is normal in size and structure. Venous: The inferior vena cava was not well visualized. IAS/Shunts: No atrial level shunt detected by color flow Doppler.  LEFT VENTRICLE PLAX 2D LVIDd:         5.20  cm   Diastology LVIDs:         3.80 cm   LV e' medial:    4.24 cm/s LV PW:         1.00 cm   LV E/e' medial:  26.9 LV IVS:        0.95 cm   LV e' lateral:   3.81 cm/s LVOT diam:     2.00 cm   LV E/e' lateral: 29.9 LV SV:         56 LV SV Index:   35 LVOT Area:     3.14 cm  RIGHT VENTRICLE RV Basal diam:  4.35 cm RV S prime:     14.30 cm/s TAPSE (M-mode): 3.0 cm LEFT ATRIUM             Index        RIGHT ATRIUM           Index LA diam:        4.70 cm 2.99 cm/m   RA Area:     14.30 cm LA Vol (A2C):   75.6 ml 48.07 ml/m  RA Volume:   35.80 ml  22.76 ml/m LA Vol (A4C):   50.4 ml 32.05 ml/m LA Biplane Vol: 67.4 ml 42.85 ml/m  AORTIC VALVE                     PULMONIC VALVE AV Area (Vmax):    0.57 cm      PV Vmax:        0.92 m/s AV Area (Vmean):   0.53 cm      PV Vmean:       61.800 cm/s AV Area (VTI):     0.64 cm      PV VTI:         0.171 m AV Vmax:           357.75 cm/s   PV Peak grad:   3.4 mmHg AV Vmean:          281.000 cm/s  PV Mean grad:   2.0 mmHg AV VTI:            0.866 m       RVOT Peak grad: 4 mmHg AV Peak Grad:      51.2 mmHg AV Mean Grad:      34.2 mmHg LVOT Vmax:         64.60 cm/s LVOT Vmean:        47.800 cm/s LVOT VTI:          0.177 m LVOT/AV VTI ratio: 0.20  AORTA Ao Root diam: 3.00 cm MITRAL VALVE                TRICUSPID VALVE MV Area (PHT): 3.45 cm     TR Peak grad:   39.4 mmHg MV Area VTI:   1.36 cm     TR Vmax:        314.00 cm/s MV Peak grad:  8.5 mmHg MV Mean grad:  3.0  mmHg     SHUNTS MV Vmax:       1.46 m/s     Systemic VTI:  0.18 m MV Vmean:      78.6 cm/s    Systemic Diam: 2.00 cm MV Decel Time: 220 msec     Pulmonic VTI:  0.192 m MV E velocity: 114.00 cm/s MV A velocity: 96.40 cm/s MV E/A ratio:  1.18 Kathlyn Sacramento MD Electronically signed by Kathlyn Sacramento MD Signature Date/Time: 07/23/2021/2:01:59 PM    Final      STS Risk Calculator: Procedure: AV Replacement  Risk of Mortality: ***  Morbidity or Mortality: *** Long Length of Stay: *** Short Length of Stay: ***   Permanent Stroke: *** Prolonged Ventilation: ***  DSW Infection: *** Renal Failure: *** Reoperation: ***   Assessment and Plan:   Lynnsey J Ealey is a 76 y.o. female with symptoms of severe, stage *** aortic stenosis with NYHA Class *** symptoms. I have reviewed the patient's recent echocardiogram which is notable for *** LV systolic function and *** aortic stenosis with peak gradient of *** and mean transvalvular gradient of ***. The patient's dimensionless index is *** and calculated aortic valve area is *** cm.    I have reviewed the natural history of aortic stenosis with the patient. We have discussed the limitations of medical therapy and the poor prognosis associated with symptomatic aortic stenosis. We have reviewed potential treatment options, including palliative medical therapy, conventional surgical aortic valve replacement, and transcatheter aortic valve replacement. We discussed treatment options in the context of this patient's specific comorbid medical conditions.    The patient's predicted risk of mortality with conventional aortic valve replacement is *** primarily based on ***. Other significant comorbid conditions include ***. TAVR seems like a reasonable treatment option for this patient pending formal cardiac surgical consultation. We discussed typical evaluation which will require a gated cardiac CTA and a CTA of the chest/abdomen/pelvis to evaluate both his cardiac anatomy and peripheral vasculature. Follow-up testing will be arranged as an outpatient. ***    {Complete the following score calculators/questions which help meet required metrics.  Press F2         :[id number]   {Is the patient being seen for CHEST PAIN, UNSTABLE ANGINA, NSTEMI or STEMI?     :[id number]  {Does this patient have CHF or CHF symptoms?      :[id number] {Does this patient have ATRIAL FIBRILLATION?:[phone]}  {Are we signing off today?:[id number]}  For questions or updates, please  contact [place] Please consult [email or web address] for contact info under    Signed, Angelena Form, PA-C  07/23/2021 4:41 PM

## 2021-07-23 NOTE — Progress Notes (Signed)
ANTICOAGULATION CONSULT NOTE  Pharmacy Consult for Heparin  Indication: chest pain/ACS  Allergies  Allergen Reactions   Meloxicam Other (See Comments)    GI bleeding   Nsaids Other (See Comments)    Gi bleeding   Sitagliptin Hives and Itching    Januvia     Patient Measurements: Height: 5\' 1"  (154.9 cm) Weight: 59 kg (130 lb) IBW/kg (Calculated) : 47.8 Heparin Dosing Weight: 59 kg   Vital Signs: Temp: 98 F (36.7 C) (01/23 0425) Temp Source: Oral (01/23 0425) BP: 118/68 (01/23 0425) Pulse Rate: 69 (01/23 0425)  Labs: Recent Labs    07/21/21 2138 07/21/21 2329 07/22/21 0439 07/22/21 0729 07/22/21 1750 07/23/21 0430  HGB 10.0*  --  9.7*  --   --  9.2*  HCT 29.9*  --  28.1*  --   --  27.5*  PLT 204  --  194  --   --  181  APTT  --  38*  --   --   --   --   LABPROT  --  14.3 14.2  --   --   --   INR  --  1.1 1.1  --   --   --   HEPARINUNFRC  --   --   --  <0.10* <0.10* 0.15*  CREATININE 0.62  --  0.56  --   --  0.68  TROPONINIHS 3,727* 4,188*  --   --   --   --      Estimated Creatinine Clearance: 50.9 mL/min (by C-G formula based on SCr of 0.68 mg/dL).   Medical History: Past Medical History:  Diagnosis Date   Allergy    seasonal allergies   Anemia 2014   needed 5 units of blood d/t passing out, weak   Anxiety    Aortic stenosis 12/24/2017   Echo Aug 2018   Arthritis    Asthma    allergy induced asthma   Cardiac murmur 12/12/2017   Cataract    Cataract    left   Complication of anesthesia    arrhythmia following colonoscopy   Degenerative disc disease, lumbar    Degenerative disc disease, lumbar    Depression    Diabetes mellitus    Diabetic neuropathy ([place]) 11/16/2015   Dysrhythmia    stenosis of left ventricle   GERD (gastroesophageal reflux disease)    H/O transfusion    patient was given 5 units of blood while at [place], blood type O+   History of chicken pox    History of hiatal hernia    History of measles, mumps, or rubella    HOH  (hard of hearing)    does not use hearing aides yet   Hyperlipidemia    Hypertension    Irregular heartbeat    LVH (left ventricular hypertrophy) 12/24/2017   Echo Aug 2018   Neuropathy    NSTEMI (non-ST elevated myocardial infarction) ([place]) 07/21/2021   Opiate use 11/16/2015   Peripheral arterial disease ([place]) 05/06/2018   At rest, left > right; refer to vasc   Pes planus of both feet 11/16/2015   Plantar fasciitis of right foot 11/16/2015   Pneumonia    Wheezing       Assessment: Pharmacy consulted to dose heparin in this 76 year old female admitted with ACS/NSTEMI.  No prior anticoag noted.  CrCl = 50.9 ml/min   Date Time HL Rate/comment 1/22 0729 <0.10 600 un/hr 1/22 1750 <0.10 900 un/hr 1/23  0430      0.15  1150 units/hr  Goal of Therapy:  Heparin level 0.3-0.7 units/ml Monitor platelets by anticoagulation protocol: Yes   Plan:  1/23:  HL @ 0430 = 0.15, SUBtherapeutic  Will order heparin 1800 units IV X 1 bolus and increase drip rate to 1350 units/hr.  Will recheck HL 8 hrs after rate change.   Orene Desanctis, PharmD Clinical Pharmacist 07/23/2021 5:28 AM

## 2021-07-23 NOTE — H&P (Addendum)
History & Physical    Patient ID: DELAYNA SPARLIN MRN: [id number], DOB/AGE: [birth year]   Admit date: 07/23/2021   Primary Physician: Marguerita Merles, MD Primary Cardiologist: Ida Rogue, MD  Patient Profile    76 y/o ? w/ a h/o PAD s/p multiple lower extremity PTAs/stents, mesenteric ischemia/ischemic colitis, HL, DMII, tob abuse, COPD, and HL, who was admitted to [facility] on 07/21/2021 w/ dyspnea, back pain, and NSTEMI, and has been found to have LV dysfunction in the setting of severe AS, and critical LM disease.  Past Medical History    Past Medical History:  Diagnosis Date   Allergy    seasonal allergies   Anemia 2014   needed 5 units of blood d/t passing out, weak   Anxiety    Arthritis    Asthma    allergy induced asthma   CAD (coronary artery disease)    a. 05/2018 MV: EF 82%, no ischemia/infarct; b. 07/2021 NSTEMI/Cath: LM 55m LAD 70p/m, LCX min irregs, RCA 20ost/p. AoV mean grad of 348mg w/ AVA of 0.44. EF 25-35%.   Cardiac murmur 12/12/2017   Cataract    left   Complication of anesthesia    arrhythmia following colonoscopy   Degenerative disc disease, lumbar    Depression    Diabetes mellitus    Diabetic neuropathy (HCC[place]05/18/2017   GERD (gastroesophageal reflux disease)    H/O transfusion    patient [place] [facility] of [place] [facility] [facility]   HFrEF (heart failure with reduced ejection fraction) (HCC[place]   a. 05/2018 Echo: EF 60-65%; b. 07/2021 Echo: EF 30-35%.   History of chicken pox    History of hiatal hernia    History of measles, mumps, or rubella    HOH (hard of hearing)    does not use hearing aides yet   Hyperlipidemia    Hypertension    Irregular heartbeat    Ischemic cardiomyopathy    a. 05/2018 Echo: EF 60-65%; b. 07/2021 Echo: EF 30-35%. GrII DD, sev mid-apical/anteroseptal/ant/apical HK.   Ischemic colitis (HCCall[place]   LVH (left ventricular hypertrophy) 12/24/2017   Echo Aug 2018   Mesenteric ischemia (HCP[place]   a. 12/2019  s/p PTA/stenting of SMA w/ 6x29 balloon expandable VBX stent.   Moderate mitral regurgitation    Neuropathy    NSTEMI (non-ST elevated myocardial infarction) (HCLem[place]01/21/2023   Opiate use 11/16/2015   Peripheral arterial disease (HCWahia[place]   a. 05/2018 s/p PTA/DCBA of L CFA; b. 11/2018 PTAR TP trunk and prox peroneal, DCBA R SFA & prox pop, Viabahn stenting to mid/distal R SFA; c. 05/2019 DCBA/stenting L SFA.   Pes planus of both feet 11/16/2015   Plantar fasciitis of right foot 11/16/2015   Pneumonia    Severe aortic stenosis    a. 05/2018 Echo: EF 60-65%, GrI DD, mild AS (mean grad 1793m, AVA 1.31cm^2); b. 07/2021 Echo: Ef 30-35%, mild LVH, GrII DD, sev mid-apical/anteroseptal/ant/apical HK. Nl RV fxn. Mildly dil LA. Mod MR. Sev AS (mean grad 34.2mm70m AVA 0.64 cm^2).   Wheezing     Past Surgical History:  Procedure Laterality Date   ABDOMINAL HYSTERECTOMY     APPLICATION OF WOUND VAC Left 07/23/2018   Procedure: APPLICATION OF WOUND VAC;  Surgeon: Dew,Algernon Huxley;  Location: [facility] ORS;  Service: Vascular;  Laterality: Left;   banck injections     CATARACT EXTRACTION W/PHACO Right 05/30/2015   Procedure: CATARACT EXTRACTION PHACO AND INTRAOCULAR  LENS PLACEMENT (IOC);  Surgeon: Birder Robson, MD;  Location: [facility] ORS;  Service: Ophthalmology;  Laterality: Right;  [place] 00:57 AP% 20.9 CDE 11.99 fluid pack lot #[id number]   CATARACT EXTRACTION W/PHACO Left 11/11/2019   Procedure: CATARACT EXTRACTION PHACO AND INTRAOCULAR LENS PLACEMENT ([place]) LEFT DIABETIC;  Surgeon: Birder Robson, MD;  Location: [facility] ORS;  Service: Ophthalmology;  Laterality: Left;  Lot #[id number] H [place]: 00:47.5 CDE: 6.24   CHOLECYSTECTOMY  1970   COLON SURGERY  2013   blocked colon   COLONOSCOPY N/A 12/29/2019   Procedure: COLONOSCOPY;  Surgeon: Virgel Manifold, MD;  Location: [facility] ENDOSCOPY;  Service: Endoscopy;  Laterality: N/A;   COLONOSCOPY WITH PROPOFOL N/A 01/20/2018   Procedure: COLONOSCOPY WITH PROPOFOL;  Surgeon:  Lucilla Lame, MD;  Location: [facility] ENDOSCOPY;  Service: Endoscopy;  Laterality: N/A;   ENDARTERECTOMY FEMORAL Left 07/08/2018   Procedure: ENDARTERECTOMY FEMORAL;  Surgeon: Algernon Huxley, MD;  Location: [facility] ORS;  Service: Vascular;  Laterality: Left;   EYE SURGERY Right    cataract surgery   FOOT FUSION Right 2018   metal in foot   [place]  2010   lost 178 lbs and regained 40 lbs last few years   HUMERUS FRACTURE SURGERY Right    metal plate with screws   internal bleeding  2016   ulcer in past   LOWER EXTREMITY ANGIOGRAM Left 07/08/2018   Procedure: LOWER EXTREMITY ANGIOGRAM;  Surgeon: Algernon Huxley, MD;  Location: [facility] ORS;  Service: Vascular;  Laterality: Left;   LOWER EXTREMITY ANGIOGRAPHY Left 05/27/2018   Procedure: LOWER EXTREMITY ANGIOGRAPHY;  Surgeon: Algernon Huxley, MD;  Location: [place] CV LAB;  Service: Cardiovascular;  Laterality: Left;   LOWER EXTREMITY ANGIOGRAPHY Right 12/07/2018   Procedure: LOWER EXTREMITY ANGIOGRAPHY;  Surgeon: Algernon Huxley, MD;  Location: [place] [facility] LAB;  Service: Cardiovascular;  Laterality: Right;   LOWER EXTREMITY ANGIOGRAPHY Left 05/10/2019   Procedure: LOWER EXTREMITY ANGIOGRAPHY;  Surgeon: Algernon Huxley, MD;  Location: [place] CV LAB;  Service: Cardiovascular;  Laterality: Left;   MOUTH SURGERY     root canals and crowns and extractions   MOUTH SURGERY  09/30/2019   OVARY SURGERY     SKIN GRAFT Right 2018   RT foot. foot has been rebuilt.  it is full of metal   TENOTOMY ACHILLES TENDON Right    Percuntaneous. metal in foot   TONSILLECTOMY     VISCERAL ANGIOGRAPHY N/A 12/31/2019   Procedure: VISCERAL ANGIOGRAPHY;  Surgeon: Algernon Huxley, MD;  Location: [place] CV LAB;  Service: Cardiovascular;  Laterality: N/A;   WOUND DEBRIDEMENT Left 07/23/2018   Procedure: DEBRIDEMENT WOUND;  Surgeon: Algernon Huxley, MD;  Location: [facility] ORS;  Service: Vascular;  Laterality: Left;     Allergies  Allergies   Allergen Reactions   Meloxicam Other (See Comments)    GI bleeding   Nsaids Other (See Comments)    Gi bleeding   Sitagliptin Hives and Itching    Januvia     History of Present Illness    76 y/o ? w/ a h/o PAD s/p multiple lower extremity PTAs/stents, mesenteric ischemia/ischemic colitis, HL, DMII, tob abuse, COPD, and HL.  She was previously evaluated w/ stress testing in 05/2018, which was low risk.  Echo at that time showed nl EF w/ calcified AoV but only mild AS.  She has since required multiple lower extremity vascular interventions/PTAs, along w/ SMA stenting, but had not  required any additional cardiac w/u.    Unfortunately, Ms. Cork presented to the [facility] ED on 1/21 w/ a 3 day h/o dyspnea and pain between her shoulder blades.  She noted that she felt like she strained something in her back.  In the ED, CXR showed emphysema and sm bilat pl effusinos.  HsTrop up @ 2300 and subsequently rose to 4188.  BNP elevated @ 2354.  She was admitted and placed on heparin and IV lasix.  She underwent diagnostic cath this AM, which showed severe LM dzs, mod LAD dzs, and severe AS w/ AoV mean grad of 35mHg and AVA of 0.44. EF 25-35%.  Echo performed post-cath confirms severe AS (AVA 0.64 cm^2, AoV mean grad 34.241mg), w/ EF 30-35% and GrII DD.  In the setting of severe CAD, AS, and at least moderate MR[facility].  [facility] Medications    Prior to Admission medications   Medication Sig Start Date End Date Taking? Authorizing Provider  acetaminophen (TYLENOL) 325 MG tablet Take 2 tablets (650 mg total) by mouth every 6 (six) hours as needed for mild pain, moderate pain, headache or fever. 12/31/19   ChVashti HeyMD  albuterol (VENTOLIN HFA) 108 (90 Base) MCG/ACT inhaler INHALE 2 PUFFS INTO THE LUNGS EVERY 6 HOURS AS NEEDED FOR WHEEZING OR SHORTNESS OF BREATH 04/04/20   TaDelsa GranaPA-C  aspirin EC 81 MG EC tablet Take 1 tablet (81 mg total) by mouth  daily. Swallow whole. 07/14/2021   AyJennye BoroughsMD  atorvastatin (LIPITOR) 10 MG tablet Take 1 tablet (10 mg total) by mouth at bedtime. 08/24/19   TaDelsa GranaPA-C  atorvastatin (LIPITOR) 40 MG tablet Take 1 tablet (40 mg total) by mouth daily at 6 PM. 07/23/21   AyJennye BoroughsMD  Cholecalciferol (VITAMIN D3) 250 MCG (10000 UT) TABS Take 10,000 Units by mouth daily.     [provider]  clopidogrel (PLAVIX) 75 MG tablet TAKE 1 TABLET BY MOUTH ONCE A DAY 01/08/21   GoMinna MerrittsMD  fluticasone (FLONASE) 50 MCG/ACT nasal spray Place 2 sprays into both nostrils daily as needed. 08/24/19   TaDelsa GranaPA-C  gabapentin (NEURONTIN) 400 MG capsule Take 1 capsule (400 mg total) by mouth 2 (two) times daily. 05/31/20   TaDelsa GranaPA-C  ipratropium-albuterol (DUONEB) 0.5-2.5 (3) MG/3ML SOLN Take 3 mLs by nebulization 3 (three) times daily as needed. Patient taking differently: Take 3 mLs by nebulization 3 (three) times daily as needed (Shortness of breath).  05/24/19   TaDelsa GranaPA-C  ipratropium-albuterol (DUONEB) 0.5-2.5 (3) MG/3ML SOLN Take 3 mLs by nebulization 3 (three) times daily as needed. 09/06/20 10/16/20  Menshew, JeDannielle KarvonenPA-C  lisinopril (ZESTRIL) 10 MG tablet Take 1 tablet (10 mg total) by mouth daily. 08/24/19   TaDelsa GranaPA-C  metoprolol succinate (TOPROL-XL) 25 MG 24 hr tablet Take 0.5 tablets (12.5 mg total) by mouth daily. 07/01/2021   AyJennye BoroughsMD  montelukast (SINGULAIR) 10 MG tablet Take 1 tablet (10 mg total) by mouth at bedtime. 08/24/19   TaDelsa GranaPA-C  Multiple Vitamins-Minerals (MULTIVITAMIN ADULTS 50+) TABS Take 1 tablet by mouth daily. 11/02/18   Poulose, ElBethel BornNP  nicotine (NICODERM CQ - DOSED IN MG/24 HOURS) 14 mg/24hr patch Place 1 patch (14 mg total) onto the skin daily. 07/18/2021   AyJennye BoroughsMD  pantoprazole (PROTONIX) 20 MG tablet Take 1 tablet (20 mg total) by mouth daily. 05/31/20  [name], PA-C  Respiratory Therapy  Supplies (NEBULIZER/TUBING/MOUTHPIECE) KIT Disp one nebulizer machine, tubing set and mouthpiece kit Patient not taking: Reported on 05/31/2020 05/24/19   [name], PA-C  sertraline (ZOLOFT) 100 MG tablet TAKE 1 AND 1/2 TABLETS (150 MG TOTAL) BY MOUTH DAILY Patient taking differently: Take 100 mg by mouth daily.  08/24/19   [name], PA-C  umeclidinium-vilanterol (ANORO ELLIPTA) 62.5-25 MCG/INH AEPB Inhale 1 puff into the lungs daily. 08/24/19   [name], PA-C  varenicline (CHANTIX) 1 MG tablet  06/01/20   [provider]  vitamin C (ASCORBIC ACID) 500 MG tablet Take 1,000 mg by mouth every other day.     [provider]  zinc gluconate 50 MG tablet Take 50 mg by mouth daily.    [provider]    Family History    Family History  Problem Relation Age of Onset   Asthma Mother    COPD Mother    Arthritis Father    Depression Father    Heart disease Father    Hypertension Father    Stroke Father    Heart attack Father    Arthritis Brother    Depression Brother    Diabetes Brother    Heart disease Brother    Hyperlipidemia Brother    Hypertension Brother    Stroke Brother    Vision loss Brother    Heart attack Brother    Diabetes Maternal Grandmother    Thyroid disease Daughter    Colitis Daughter    Breast cancer Neg Hx    She indicated that her mother is deceased. She indicated that her father is deceased. She indicated that her brother is deceased. She indicated that the status of her maternal grandmother is unknown. She indicated that her daughter is alive. She indicated that the status of her neg hx is unknown.   Social History    Social History   Socioeconomic History   Marital status: Divorced    Spouse name: Not on file   Number of children: 1   Years of education: Not on file   Highest education level: Associate degree: academic program  Occupational History   Occupation: Oceanographer    Comment: [employer]  Tobacco Use   Smoking status: Some Days    Packs/day: 0.50    Years: 55.00    Pack years: 27.50    Types: Cigarettes    Start date: 07/01/1960    Last attempt to quit: 11/22/2018    Years since quitting: 2.6   Smokeless tobacco: Never   Tobacco comments:    trying to quit again; wants to use chantix but unable to afford  Vaping Use   Vaping Use: Never used  Substance and Sexual Activity   Alcohol use: Yes    Alcohol/week: 0.0 standard drinks    Comment: 2 drinks per month   Drug use: Not Currently    Types: Marijuana    Comment: used for pain   Sexual activity: Not Currently  Other Topics Concern   Not on file  Social History Narrative   Not on file   Social Determinants of Health   Financial Resource Strain: Not on file  Food Insecurity: Not on file  Transportation Needs: Not on file  Physical Activity: Not on file  Stress: Not on file  Social Connections: Not on file  Intimate Partner Violence: Not on file     Review of Systems    General:  No chills, fever, night sweats or weight changes.  Cardiovascular:  +++ mid-scapular back pain and dyspnea leading up to admission.  No chest pain, edema, orthopnea, palpitations, paroxysmal nocturnal dyspnea. Dermatological: No rash, lesions/masses Respiratory: No cough, +++ dyspnea Urologic: No hematuria, dysuria Abdominal:   No nausea, vomiting, diarrhea, bright red blood per rectum, melena, or hematemesis Neurologic:  No visual changes, wkns, changes in mental status. All other systems reviewed and are otherwise negative except as noted above.  Physical Exam    98, 62, 17,  98/62, 98% General: Pleasant, NAD Psych: Normal affect. Neuro: Alert and oriented X 3. Moves all extremities spontaneously. HEENT: Normal  Neck: Supple without bruits or JVD. Lungs:  Resp regular and unlabored, CTA. Heart: RRR no s3, s4, 3/6 SEM @ upper sternal borders. Abdomen: Soft, non-tender, non-distended, BS + x 4.   Extremities: No clubbing, cyanosis or edema. DP/PT 2+, Radials 2+ and equal bilaterally.  Labs    Cardiac Enzymes Recent Labs  Lab 07/21/21 2138 07/21/21 2329  TROPONINIHS 3,727* 4,188*      Lab Results  Component Value Date   WBC 9.3 07/23/2021   HGB 9.2 (L) 07/23/2021   HCT 27.5 (L) 07/23/2021   MCV 79.0 (L) 07/23/2021   PLT 181 07/23/2021    Recent Labs  Lab 07/23/21 0430  NA 127*  K 3.9  CL 94*  CO2 25  BUN 24*  CREATININE 0.68  CALCIUM 8.6*  GLUCOSE 111*   Lab Results  Component Value Date   CHOL 107 07/22/2021   HDL 46 07/22/2021   LDLCALC 49 07/22/2021   TRIG 58 07/22/2021   Lab Results  Component Value Date   DDIMER 0.95 (H) 02/08/2011     Radiology Studies    DG Chest 2 View  Result Date: 07/21/2021 CLINICAL DATA:  Shortness of breath. EXAM: CHEST - 2 VIEW COMPARISON:  Chest radiograph dated 09/06/2020. FINDINGS: Background of emphysema. Diffuse interstitial prominence may be chronic or represent mild edema. There is blunting of the costophrenic angles which may represent small bilateral pleural effusions. No pneumothorax. The cardiac silhouette is within normal limits. Atherosclerotic calcification of the aorta. No acute osseous pathology. Degenerative changes of the spine. IMPRESSION: Emphysema with possible mild interstitial edema and small bilateral pleural effusions. Electronically Signed   By: Anner Crete M.D.   On: 07/21/2021 22:08   ECG & Cardiac Imaging    RSR, 68, PAC, LVH - personally reviewed.  Assessment & Plan    1.  NSTEMI/CAD:  pt presented 1/21 w/ 3 day h/o mid-scapular back pain and dyspnea and found to rule in for NSTEMI w/ peak HsTrop of 4188 (not repeated since).  Cath this AM w/ critical LM dzs, along w/ severe AS, and at least moderate MR.  Plan to resume heparin @ 1630 and tx to [facility] for further eval by advanced CHF team, TCTS, and structural heart team.  Cont asa, statin, ? blocker.   2.  Acute systolic CHF/ICM:  EF  53-61% by echo this admission.  RHC w/ elevated filling pressures (PA 51/21(32),PCWP mean of 22).  Cont IV diuresis.  To be seen by advanced CHF team.  Cont ? blocker and transition lisinopril to losartan  Eventually entresto + MRA if bp allows.  3.  Severe Aortic Stenosis:  noted on echo/cath w/ AoV mean grad of 42mHg w/ AVA of 0.44. EF 25-35%.  To be seen by structural heart team/TCTS.  4.  Moderate MR:  Large V waves on RHC.  Mod MR on echo.  5.  Essential HTN:  intermittently soft BPs.  On ? blocker and acei currently (see above - change to arb).  Follow.  6.  HL:  cont statin rx.  7.  Tob Abuse/COPD:  cont nicotine patch and inhalers/nebs.  8.  PAD:  S/p multiple lower ext PTAs/stents.  Cont asa/statin rx.  Followed by vasc surgery as outpt.  No recent claudication.  9.  Mesenteric ischemia:  s/p prior SMA stenting.  Asymptomatic.  10.  Normocytic anemia:  stable.  11.  DMII:  cont SSI.  Consider SGLT2i in post-op setting.  12.  Hyponatremia:  follow w/ diuresis.  13.  Hypomagnesemia:  Mg 1.5 this AM. S/p supplementation.  F/u in AM.  Signed, Murray Hodgkins, NP 07/23/2021, 4:36 PM

## 2021-07-23 NOTE — Progress Notes (Signed)
Progress Note  Patient Name: Andrea Holden Date of Encounter: 07/23/2021  [facility] Cardiologist: Ida Rogue, MD   Subjective   She reports continued shortness of breath but no significant chest pain.  Inpatient Medications    Scheduled Meds:  albuterol  2.5 mg Nebulization Q6H   aspirin EC  81 mg Oral Daily   atorvastatin  40 mg Oral q1800   gabapentin  400 mg Oral BID   Gerhardt's butt cream   Topical BID   lisinopril  10 mg Oral Daily   metoprolol succinate  12.5 mg Oral Daily   montelukast  10 mg Oral QHS   nicotine  14 mg Transdermal Daily   sertraline  150 mg Oral Daily   sodium chloride flush  3 mL Intravenous Q12H   sodium chloride flush  3 mL Intravenous Q12H   umeclidinium-vilanterol  1 puff Inhalation Daily   Continuous Infusions:  sodium chloride     sodium chloride     sodium chloride     heparin 1,350 Units/hr (07/23/21 1600)   PRN Meds: sodium chloride, sodium chloride, acetaminophen, fluticasone, ipratropium-albuterol, nitroGLYCERIN, ondansetron (ZOFRAN) IV, sodium chloride flush, sodium chloride flush   Vital Signs    Vitals:   07/23/21 1042 07/23/21 1110 07/23/21 1136 07/23/21 1552  BP:  98/62  (!) 84/44  Pulse: 68 62  70  Resp: 20 17  17   Temp:  98.1 F (36.7 C)  98 F (36.7 C)  TempSrc:  Oral    SpO2: 92% 92% 97% 98%  Weight:      Height:  5\' 1"  (1.549 m)      Intake/Output Summary (Last 24 hours) at 07/23/2021 1629 Last data filed at 07/23/2021 0730 Gross per 24 hour  Intake 340.31 ml  Output 800 ml  Net -459.69 ml   Last 3 Weights 07/21/2021 03/28/2021 09/06/2020  Weight (lbs) 130 lb 145 lb 1 oz 145 lb  Weight (kg) 58.968 kg 65.8 kg 65.772 kg      Telemetry    Normal sinus rhythm- Personally Reviewed  ECG     - Personally Reviewed  Physical Exam   GEN: No acute distress.   Neck: No JVD Cardiac: RRR, rubs, or gallops.  3 out of 6 crescendo decrescendo systolic murmur in the aortic area which is late peaking  with absent S2. Respiratory: Clear to auscultation bilaterally. GI: Soft, nontender, non-distended  MS: No edema; No deformity. Neuro:  Nonfocal  Psych: Normal affect   Labs    High Sensitivity Troponin:   Recent Labs  Lab 07/21/21 2138 07/21/21 2329  TROPONINIHS 3,727* 4,188*     Chemistry Recent Labs  Lab 07/21/21 2138 07/22/21 0439 07/23/21 0430  NA 126* 126* 127*  K 3.8 3.9 3.9  CL 92* 95* 94*  CO2 26 25 25   GLUCOSE 159* 128* 111*  BUN 17 20 24*  CREATININE 0.62 0.56 0.68  CALCIUM 9.0 9.0 8.6*  MG  --   --  1.5*  GFRNONAA >60 >60 >60  ANIONGAP 8 6 8     Lipids  Recent Labs  Lab 07/22/21 0439  CHOL 107  TRIG 58  HDL 46  LDLCALC 49  CHOLHDL 2.3    Hematology Recent Labs  Lab 07/21/21 2138 07/22/21 0439 07/23/21 0430  WBC 12.0* 10.2 9.3  RBC 3.75* 3.52* 3.48*  HGB 10.0* 9.7* 9.2*  HCT 29.9* 28.1* 27.5*  MCV 79.7* 79.8* 79.0*  MCH 26.7 27.6 26.4  MCHC 33.4 34.5 33.5  RDW 14.4  14.6 14.7  PLT 204 194 181   Thyroid  Recent Labs  Lab 07/21/21 2329  TSH 1.232    BNP Recent Labs  Lab 07/21/21 2329  BNP 2,354.7*    DDimer No results for input(s): DDIMER in the last 168 hours.   Radiology    DG Chest 2 View  Result Date: 07/21/2021 CLINICAL DATA:  Shortness of breath. EXAM: CHEST - 2 VIEW COMPARISON:  Chest radiograph dated 09/06/2020. FINDINGS: Background of emphysema. Diffuse interstitial prominence may be chronic or represent mild edema. There is blunting of the costophrenic angles which may represent small bilateral pleural effusions. No pneumothorax. The cardiac silhouette is within normal limits. Atherosclerotic calcification of the aorta. No acute osseous pathology. Degenerative changes of the spine. IMPRESSION: Emphysema with possible mild interstitial edema and small bilateral pleural effusions. Electronically Signed   By: Anner Crete M.D.   On: 07/21/2021 22:08   CARDIAC CATHETERIZATION  Result Date: 07/23/2021   Mid LM lesion is  95% stenosed.   Prox LAD to Mid LAD lesion is 70% stenosed.   Ost RCA to Prox RCA lesion is 20% stenosed.   There is moderate to severe left ventricular systolic dysfunction.   LV end diastolic pressure is moderately elevated.   The left ventricular ejection fraction is 25-35% by visual estimate. 1.  Critical left main stenosis with significant calcifications.  No obstructive disease involving the right coronary artery. 2.  Moderately to severely reduced LV systolic function with an EF of 30 to 35% with global hypokinesis.  Moderate to severe mitral regurgitation. 3.  Severe aortic stenosis with mean gradient of 34 mmHg and valve area of 0.44. 4.  Right heart catheterization showed moderately elevated wedge pressure, moderate pulmonary hypertension and severely reduced cardiac output.  Prominent V waves on pulmonary wedge tracing suggestive of significant mitral regurgitation. Recommendations: Transfer to [facility] for evaluation of CABG plus AVR versus left main stenting plus TAVR depending on candidacy for surgery.  She does have underlying COPD and also seem to have poor nutrition and weight loss and thus likely a high risk surgical candidate. Resume heparin at 4:30 PM.  Proceed with echocardiogram.   ECHOCARDIOGRAM COMPLETE  Result Date: 07/23/2021    ECHOCARDIOGRAM REPORT   Patient Name:   Andrea Holden [facility] Date of Exam: 07/23/2021 Medical Rec #:  [id number]          Height:       61.0 in Accession #:    [id number]         Weight:       130.0 lb Date of Birth:  1946-06-18          BSA:          1.573 m Patient Age:    76 years           BP:           94/53 mmHg Patient Gender: F                  HR:           78 bpm. Exam Location:  [facility] Procedure: 2D Echo, Cardiac Doppler and Color Doppler Indications:     NSTEMI I21.4  History:         Patient has prior history of Echocardiogram examinations, most                  recent 06/04/2018. Signs/Symptoms:Murmur; Risk  Factors:Hypertension.  Sonographer:     Sherrie Sport Referring Phys:  [place] Diagnosing Phys: Kathlyn Sacramento MD  Sonographer Comments: Suboptimal apical window. IMPRESSIONS  1. Left ventricular ejection fraction, by estimation, is 30 to 35%. The left ventricle has moderately decreased function. The left ventricle demonstrates regional wall motion abnormalities (see scoring diagram/findings for description). There is mild left ventricular hypertrophy. Left ventricular diastolic parameters are consistent with Grade II diastolic dysfunction (pseudonormalization). There is severe hypokinesis of the left ventricular, mid-apical anteroseptal wall, anterior wall, anterior segment and apical segment.  2. Right ventricular systolic function is normal. The right ventricular size is normal. There is mildly elevated pulmonary artery systolic pressure.  3. Left atrial size was mildly dilated.  4. The mitral valve is abnormal. Moderate mitral valve regurgitation. No evidence of mitral stenosis. Severe mitral annular calcification.  5. The aortic valve is calcified. Aortic valve regurgitation is not visualized. Severe aortic valve stenosis. Aortic valve area, by VTI measures 0.64 cm. Aortic valve mean gradient measures 34.2 mmHg. FINDINGS  Left Ventricle: Left ventricular ejection fraction, by estimation, is 30 to 35%. The left ventricle has moderately decreased function. The left ventricle demonstrates regional wall motion abnormalities. Severe hypokinesis of the left ventricular, mid-apical anteroseptal wall, anterior wall, anterior segment and apical segment. The left ventricular internal cavity size was normal in size. There is mild left ventricular hypertrophy. Left ventricular diastolic parameters are consistent with Grade II  diastolic dysfunction (pseudonormalization). Right Ventricle: The right ventricular size is normal. No increase in right ventricular wall thickness. Right ventricular systolic function is  normal. There is mildly elevated pulmonary artery systolic pressure. The tricuspid regurgitant velocity is 3.14  m/s, and with an assumed right atrial pressure of 5 mmHg, the estimated right ventricular systolic pressure is [id number] mmHg. Left Atrium: Left atrial size was mildly dilated. Right Atrium: Right atrial size was normal in size. Pericardium: There is no evidence of pericardial effusion. Mitral Valve: The mitral valve is abnormal. There is moderate thickening of the mitral valve leaflet(s). There is moderate calcification of the mitral valve leaflet(s). Severe mitral annular calcification. Moderate mitral valve regurgitation. No evidence  of mitral valve stenosis. MV peak gradient, 8.5 mmHg. The mean mitral valve gradient is 3.0 mmHg. Tricuspid Valve: The tricuspid valve is normal in structure. Tricuspid valve regurgitation is mild . No evidence of tricuspid stenosis. Aortic Valve: The aortic valve is calcified. Aortic valve regurgitation is not visualized. Severe aortic stenosis is present. Aortic valve mean gradient measures 34.2 mmHg. Aortic valve peak gradient measures 51.2 mmHg. Aortic valve area, by VTI measures  0.64 cm. Pulmonic Valve: The pulmonic valve was normal in structure. Pulmonic valve regurgitation is not visualized. No evidence of pulmonic stenosis. Aorta: The aortic root is normal in size and structure. Venous: The inferior vena cava was not well visualized. IAS/Shunts: No atrial level shunt detected by color flow Doppler.  LEFT VENTRICLE PLAX 2D LVIDd:         5.20 cm   Diastology LVIDs:         3.80 cm   LV e' medial:    4.24 cm/s LV PW:         1.00 cm   LV E/e' medial:  26.9 LV IVS:        0.95 cm   LV e' lateral:   3.81 cm/s LVOT diam:     2.00 cm   LV E/e' lateral: 29.9 LV SV:  56 LV SV Index:   35 LVOT Area:     3.14 cm  RIGHT VENTRICLE RV Basal diam:  4.35 cm RV S prime:     14.30 cm/s TAPSE (M-mode): 3.0 cm LEFT ATRIUM             Index        RIGHT ATRIUM           Index LA  diam:        4.70 cm 2.99 cm/m   RA Area:     14.30 cm LA Vol (A2C):   75.6 ml 48.07 ml/m  RA Volume:   35.80 ml  22.76 ml/m LA Vol (A4C):   50.4 ml 32.05 ml/m LA Biplane Vol: 67.4 ml 42.85 ml/m  AORTIC VALVE                     PULMONIC VALVE AV Area (Vmax):    0.57 cm      PV Vmax:        0.92 m/s AV Area (Vmean):   0.53 cm      PV Vmean:       61.800 cm/s AV Area (VTI):     0.64 cm      PV VTI:         0.171 m AV Vmax:           357.75 cm/s   PV Peak grad:   3.4 mmHg AV Vmean:          281.000 cm/s  PV Mean grad:   2.0 mmHg AV VTI:            0.866 m       RVOT Peak grad: 4 mmHg AV Peak Grad:      51.2 mmHg AV Mean Grad:      34.2 mmHg LVOT Vmax:         64.60 cm/s LVOT Vmean:        47.800 cm/s LVOT VTI:          0.177 m LVOT/AV VTI ratio: 0.20  AORTA Ao Root diam: 3.00 cm MITRAL VALVE                TRICUSPID VALVE MV Area (PHT): 3.45 cm     TR Peak grad:   39.4 mmHg MV Area VTI:   1.36 cm     TR Vmax:        314.00 cm/s MV Peak grad:  8.5 mmHg MV Mean grad:  3.0 mmHg     SHUNTS MV Vmax:       1.46 m/s     Systemic VTI:  0.18 m MV Vmean:      78.6 cm/s    Systemic Diam: 2.00 cm MV Decel Time: 220 msec     Pulmonic VTI:  0.192 m MV E velocity: 114.00 cm/s MV A velocity: 96.40 cm/s MV E/A ratio:  1.18 Kathlyn Sacramento MD Electronically signed by Kathlyn Sacramento MD Signature Date/Time: 07/23/2021/2:01:59 PM    Final     Cardiac Studies   Right and left cardiac catheterization was performed today by me and showed 95% stenosis in the mid left main coronary artery with significant dampening of pressure with catheter engagement.  There was evidence of severe to critical aortic stenosis with mean gradient of 34 mmHg and calculated valve area of 0.44.  Right heart catheterization showed moderately elevated wedge pressure, moderate pulmonary hypertension and severely reduced cardiac output.  Prominent V waves  were noted on pulmonary capillary wedge tracing suggestive of significant mitral  regurgitation.  Echocardiogram was done today and was personally reviewed by me.  It showed an EF of 30 to 35% with significant anterior wall hypokinesis, grade 2 diastolic dysfunction, moderate mitral regurgitation with severe mitral annular calcifications and severe calcified aortic stenosis with mean gradient of 34.2 mmHg and valve area of 0.64 cm.  Patient Profile     76 y.o. female  hx of smoking, COPD, diabetes, hyperlipidemia, severe PAD, history of left femoral endarterectomy, chronic mesenteric ischemia status post SMA stenting, lower extremity arterial with Dr. Lucky Cowboy, who presented with non-ST elevation myocardial infarction.  Assessment & Plan    1.  Non-ST elevation myocardial infarction: Left heart catheterization showed critical left main stenosis with significant pressure dampening with catheter engagement.  In addition, severe gradient was noted across the aortic valve and thus a right heart catheterization was performed and confirmed severe aortic stenosis with an EF of 30 to 35% and at least moderate mitral regurgitation. High risk patient overall.  Recommend transfer to [facility] for evaluation of surgical versus percutaneous intervention.  The patient will require CABG plus SAVR or left main stenting plus TAVR if she is not a surgical candidate. Underlying COPD and rapid weight loss over the last few years is concerning and puts the patient at significant risk for surgical complications. I discontinued Plavix for now in case surgery is decided.  Otherwise, this can be resumed if the patient is not undergo ongoing surgery. Meanwhile, resume heparin drip.  2.  Severe aortic stenosis: Management options as outlined above.  3.  Severe peripheral arterial disease: Bilateral lower extremity interventions including left common femoral artery endarterectomy, bilateral below the knee angioplasty and bilateral SFA covered stent placement.  This complicates access options for TAVR  and the patient will need evaluation with CTA.  4.  Tobacco use: She has not been able to quit smoking.  Nicotine patch while hospitalized.  5.  Weight loss: Previous stent placement to the SMA.  Poor nutrition.  Total encounter time more than 35 minutes. Greater than 50% was spent in counseling and coordination of care with the patient.       For questions or updates, please contact [place] Please consult [email or web address] for contact info under        Signed, Kathlyn Sacramento, MD  07/23/2021, 4:29 PM

## 2021-07-23 NOTE — Discharge Summary (Addendum)
Physician Discharge Summary  Andrea Holden LKJ:[id number] DOB: [birth year] DOA: 07/21/2021  PCP: Marguerita Merles, MD  Admit date: 07/21/2021 Discharge date: 07/23/2021  Discharge disposition: Transfer to [facility]   Recommendations for Outpatient Follow-Up:   Outpatient follow-up with PCP and cardiologist after discharge from the hospital   Discharge Diagnosis:   Principal Problem:   NSTEMI (non-ST elevated myocardial infarction) [facility]) Active Problems:   Diabetes mellitus without complication ([place])   Hypertension with heart disease   CAD (coronary artery disease)    Discharge Condition: Stable.  Diet recommendation:  Diet Order             Diet regular Room service appropriate? Yes; Fluid consistency: Thin  Diet effective now           Diet - low sodium heart healthy                     Code Status: Full Code     Hospital Course:   Andrea Holden is a 76 y.o. female with medical history significant for CAD (previous NSTEMI), arrhythmia, mild aortic stenosis, diabetes mellitus, depression, anxiety, pneumonia, peripheral vascular disease, GERD, degenerative disc disease of lumbar spine, who presented to the hospital because of shortness of breath, upper back and neck pain.   She was found to have acute NSTEMI, acute on chronic diastolic CHF with acute systolic CHF.  She was treated with IV heparin infusion and IV Lasix.  She underwent left and right heart catheterization which revealed critical left main stenosis (95%) with significant considerations, moderately to severely reduced LV systolic function with EF of 30 to 35%, moderate to severe mitral regurgitation, severe aortic stenosis, moderate pulmonary hypertension.  Dr. Fletcher Anon, cardiologist, recommended transfer to [facility] for further evaluation by the cardiothoracic surgeon to evaluate for candidacy for surgical intervention.   Medical Consultants:    Cardiologist   Discharge Exam:    Vitals:   07/23/21 1012 07/23/21 1042 07/23/21 1110 07/23/21 1136  BP: (!) 121/58  98/62   Pulse: 61 68 62   Resp:  20 17   Temp:   98.1 F (36.7 C)   TempSrc:   Oral   SpO2: 94% 92% 92% 97%  Weight:      Height:   _0  (1.549 m)      GEN: NAD SKIN: Warm and dry EYES: No pallor or icterus ENT: MMM CV: RRR PULM: Bilateral expiratory wheezing.  No rales heard ABD: soft, ND, NT, +BS CNS: AAO x 3, non focal EXT: No edema or tenderness   The results of significant diagnostics from this hospitalization (including imaging, microbiology, ancillary and laboratory) are listed below for reference.     Procedures and Diagnostic Studies:   CARDIAC CATHETERIZATION  Result Date: 07/23/2021   Mid LM lesion is 95% stenosed.   Prox LAD to Mid LAD lesion is 70% stenosed.   Ost RCA to Prox RCA lesion is 20% stenosed.   There is moderate to severe left ventricular systolic dysfunction.   LV end diastolic pressure is moderately elevated.   The left ventricular ejection fraction is 25-35% by visual estimate. 1.  Critical left main stenosis with significant calcifications.  No obstructive disease involving the right coronary artery. 2.  Moderately to severely reduced LV systolic function with an EF of 30 to 35% with global hypokinesis.  Moderate to severe mitral regurgitation. 3.  Severe aortic stenosis with mean gradient of 34 mmHg and valve  area of 0.44. 4.  Right heart catheterization showed moderately elevated wedge pressure, moderate pulmonary hypertension and severely reduced cardiac output.  Prominent V waves on pulmonary wedge tracing suggestive of significant mitral regurgitation. Recommendations: Transfer to [facility] for evaluation of CABG plus AVR versus left main stenting plus TAVR depending on candidacy for surgery.  She does have underlying COPD and also seem to have poor nutrition and weight loss and thus likely a high risk surgical  candidate. Resume heparin at 4:30 PM.  Proceed with echocardiogram.   ECHOCARDIOGRAM COMPLETE  Result Date: 07/23/2021    ECHOCARDIOGRAM REPORT   Patient Name:   Andrea Holden [facility] Date of Exam: 07/23/2021 Medical Rec #:  [id number]          Height:       61.0 in Accession #:    [id number]         Weight:       130.0 lb Date of Birth:  September 17, 1945          BSA:          1.573 m Patient Age:    38 years           BP:           94/53 mmHg Patient Gender: F                  HR:           78 bpm. Exam Location:  [facility] Procedure: 2D Echo, Cardiac Doppler and Color Doppler Indications:     NSTEMI I21.4  History:         Patient has prior history of Echocardiogram examinations, most                  recent 06/04/2018. Signs/Symptoms:Murmur; Risk                  Factors:Hypertension.  Sonographer:     Sherrie Sport Referring Phys:  [place] Diagnosing Phys: Kathlyn Sacramento MD  Sonographer Comments: Suboptimal apical window. IMPRESSIONS  1. Left ventricular ejection fraction, by estimation, is 30 to 35%. The left ventricle has moderately decreased function. The left ventricle demonstrates regional wall motion abnormalities (see scoring diagram/findings for description). There is mild left ventricular hypertrophy. Left ventricular diastolic parameters are consistent with Grade II diastolic dysfunction (pseudonormalization). There is severe hypokinesis of the left ventricular, mid-apical anteroseptal wall, anterior wall, anterior segment and apical segment.  2. Right ventricular systolic function is normal. The right ventricular size is normal. There is mildly elevated pulmonary artery systolic pressure.  3. Left atrial size was mildly dilated.  4. The mitral valve is abnormal. Moderate mitral valve regurgitation. No evidence of mitral stenosis. Severe mitral annular calcification.  5. The aortic valve is calcified. Aortic valve regurgitation is not visualized. Severe aortic valve stenosis. Aortic valve area, by VTI  measures 0.64 cm. Aortic valve mean gradient measures 34.2 mmHg. FINDINGS  Left Ventricle: Left ventricular ejection fraction, by estimation, is 30 to 35%. The left ventricle has moderately decreased function. The left ventricle demonstrates regional wall motion abnormalities. Severe hypokinesis of the left ventricular, mid-apical anteroseptal wall, anterior wall, anterior segment and apical segment. The left ventricular internal cavity size was normal in size. There is mild left ventricular hypertrophy. Left ventricular diastolic parameters are consistent with Grade II  diastolic dysfunction (pseudonormalization). Right Ventricle: The right ventricular size is normal. No increase in right ventricular wall thickness. Right ventricular systolic function is  normal. There is mildly elevated pulmonary artery systolic pressure. The tricuspid regurgitant velocity is 3.14  m/s, and with an assumed right atrial pressure of 5 mmHg, the estimated right ventricular systolic pressure is 24.4 mmHg. Left Atrium: Left atrial size was mildly dilated. Right Atrium: Right atrial size was normal in size. Pericardium: There is no evidence of pericardial effusion. Mitral Valve: The mitral valve is abnormal. There is moderate thickening of the mitral valve leaflet(s). There is moderate calcification of the mitral valve leaflet(s). Severe mitral annular calcification. Moderate mitral valve regurgitation. No evidence  of mitral valve stenosis. MV peak gradient, 8.5 mmHg. The mean mitral valve gradient is 3.0 mmHg. Tricuspid Valve: The tricuspid valve is normal in structure. Tricuspid valve regurgitation is mild . No evidence of tricuspid stenosis. Aortic Valve: The aortic valve is calcified. Aortic valve regurgitation is not visualized. Severe aortic stenosis is present. Aortic valve mean gradient measures 34.2 mmHg. Aortic valve peak gradient measures 51.2 mmHg. Aortic valve area, by VTI measures  0.64 cm. Pulmonic Valve: The pulmonic  valve was normal in structure. Pulmonic valve regurgitation is not visualized. No evidence of pulmonic stenosis. Aorta: The aortic root is normal in size and structure. Venous: The inferior vena cava was not well visualized. IAS/Shunts: No atrial level shunt detected by color flow Doppler.  LEFT VENTRICLE PLAX 2D LVIDd:         5.20 cm   Diastology LVIDs:         3.80 cm   LV e' medial:    4.24 cm/s LV PW:         1.00 cm   LV E/e' medial:  26.9 LV IVS:        0.95 cm   LV e' lateral:   3.81 cm/s LVOT diam:     2.00 cm   LV E/e' lateral: 29.9 LV SV:         56 LV SV Index:   35 LVOT Area:     3.14 cm  RIGHT VENTRICLE RV Basal diam:  4.35 cm RV S prime:     14.30 cm/s TAPSE (M-mode): 3.0 cm LEFT ATRIUM             Index        RIGHT ATRIUM           Index LA diam:        4.70 cm 2.99 cm/m   RA Area:     14.30 cm LA Vol (A2C):   75.6 ml 48.07 ml/m  RA Volume:   35.80 ml  22.76 ml/m LA Vol (A4C):   50.4 ml 32.05 ml/m LA Biplane Vol: 67.4 ml 42.85 ml/m  AORTIC VALVE                     PULMONIC VALVE AV Area (Vmax):    0.57 cm      PV Vmax:        0.92 m/s AV Area (Vmean):   0.53 cm      PV Vmean:       61.800 cm/s AV Area (VTI):     0.64 cm      PV VTI:         0.171 m AV Vmax:           357.75 cm/s   PV Peak grad:   3.4 mmHg AV Vmean:          281.000 cm/s  PV Mean grad:   2.0 mmHg AV  VTI:            0.866 m       RVOT Peak grad: 4 mmHg AV Peak Grad:      51.2 mmHg AV Mean Grad:      34.2 mmHg LVOT Vmax:         64.60 cm/s LVOT Vmean:        47.800 cm/s LVOT VTI:          0.177 m LVOT/AV VTI ratio: 0.20  AORTA Ao Root diam: 3.00 cm MITRAL VALVE                TRICUSPID VALVE MV Area (PHT): 3.45 cm     TR Peak grad:   39.4 mmHg MV Area VTI:   1.36 cm     TR Vmax:        314.00 cm/s MV Peak grad:  8.5 mmHg MV Mean grad:  3.0 mmHg     SHUNTS MV Vmax:       1.46 m/s     Systemic VTI:  0.18 m MV Vmean:      78.6 cm/s    Systemic Diam: 2.00 cm MV Decel Time: 220 msec     Pulmonic VTI:  0.192 m MV E velocity:  114.00 cm/s MV A velocity: 96.40 cm/s MV E/A ratio:  1.18 Kathlyn Sacramento MD Electronically signed by Kathlyn Sacramento MD Signature Date/Time: 07/23/2021/2:01:59 PM    Final      Labs:   Basic Metabolic Panel: Recent Labs  Lab 07/21/21 2138 07/22/21 0439 07/23/21 0430  NA 126* 126* 127*  K 3.8 3.9 3.9  CL 92* 95* 94*  CO2 _0 GLUCOSE 159* 128* 111*  BUN 17 20 24*  CREATININE 0.62 0.56 0.68  CALCIUM 9.0 9.0 8.6*  MG  --   --  1.5*   GFR Estimated Creatinine Clearance: 50.9 mL/min (by C-G formula based on SCr of 0.68 mg/dL). Liver Function Tests: No results for input(s): AST, ALT, ALKPHOS, BILITOT, PROT, ALBUMIN in the last 168 hours. No results for input(s): LIPASE, AMYLASE in the last 168 hours. No results for input(s): AMMONIA in the last 168 hours. Coagulation profile Recent Labs  Lab 07/21/21 2329 07/22/21 0439  INR 1.1 1.1    CBC: Recent Labs  Lab 07/21/21 2138 07/22/21 0439 07/23/21 0430  WBC 12.0* 10.2 9.3  HGB 10.0* 9.7* 9.2*  HCT 29.9* 28.1* 27.5*  MCV 79.7* 79.8* 79.0*  PLT 204 194 181   Cardiac Enzymes: No results for input(s): CKTOTAL, CKMB, CKMBINDEX, TROPONINI in the last 168 hours. BNP: Invalid input(s): POCBNP CBG: Recent Labs  Lab 07/23/21 1118  GLUCAP 122*   D-Dimer No results for input(s): DDIMER in the last 72 hours. Hgb A1c Recent Labs    07/22/21 0439  HGBA1C 6.0*   Lipid Profile Recent Labs    07/22/21 0439  CHOL 107  HDL 46  LDLCALC 49  TRIG 58  CHOLHDL 2.3   Thyroid function studies Recent Labs    07/21/21 2329  TSH 1.232   Anemia work up No results for input(s): VITAMINB12, FOLATE, FERRITIN, TIBC, IRON, RETICCTPCT in the last 72 hours. Microbiology No results found for this or any previous visit (from the past 240 hour(s)).   Discharge Instructions:   Discharge Instructions     Diet - low sodium heart healthy   Complete by: As directed       Allergies as of 07/23/2021       Reactions  Meloxicam Other (See Comments)   GI bleeding   Nsaids Other (See Comments)   Gi bleeding   Sitagliptin Hives, Itching   Januvia         Medication List     STOP taking these medications    clopidogrel 75 MG tablet Commonly known as: PLAVIX   Nebulizer/Tubing/Mouthpiece Kit   vitamin C 500 MG tablet Commonly known as: ASCORBIC ACID   zinc gluconate 50 MG tablet       TAKE these medications    acetaminophen 325 MG tablet Commonly known as: TYLENOL Take 2 tablets (650 mg total) by mouth every 6 (six) hours as needed for mild pain, moderate pain, headache or fever.   albuterol 108 (90 Base) MCG/ACT inhaler Commonly known as: VENTOLIN HFA INHALE 2 PUFFS INTO THE LUNGS EVERY 6 HOURS AS NEEDED FOR WHEEZING OR SHORTNESS OF BREATH   aspirin 81 MG EC tablet Take 1 tablet (81 mg total) by mouth daily. Swallow whole. Start taking on: July 24, 2021   atorvastatin 40 MG tablet Commonly known as: LIPITOR Take 1 tablet (40 mg total) by mouth daily at 6 PM. What changed:  medication strength how much to take when to take this   fluticasone 50 MCG/ACT nasal spray Commonly known as: FLONASE Place 2 sprays into both nostrils daily as needed.   gabapentin 400 MG capsule Commonly known as: NEURONTIN Take 1 capsule (400 mg total) by mouth 2 (two) times daily.   ipratropium-albuterol 0.5-2.5 (3) MG/3ML Soln Commonly known as: DUONEB Take 3 mLs by nebulization 3 (three) times daily as needed. What changed:  reasons to take this Another medication with the same name was removed. Continue taking this medication, and follow the directions you see here.   lisinopril 10 MG tablet Commonly known as: ZESTRIL Take 1 tablet (10 mg total) by mouth daily.   metoprolol succinate 25 MG 24 hr tablet Commonly known as: TOPROL-XL Take 0.5 tablets (12.5 mg total) by mouth daily. Start taking on: July 24, 2021   montelukast 10 MG tablet Commonly known as: SINGULAIR Take 1  tablet (10 mg total) by mouth at bedtime.   Multivitamin Adults 50+ Tabs Take 1 tablet by mouth daily.   nicotine 14 mg/24hr patch Commonly known as: NICODERM CQ - dosed in mg/24 hours Place 1 patch (14 mg total) onto the skin daily. Start taking on: July 24, 2021   pantoprazole 20 MG tablet Commonly known as: PROTONIX Take 1 tablet (20 mg total) by mouth daily.   sertraline 100 MG tablet Commonly known as: ZOLOFT TAKE 1 AND 1/2 TABLETS (150 MG TOTAL) BY MOUTH DAILY What changed:  how much to take how to take this when to take this additional instructions   umeclidinium-vilanterol 62.5-25 MCG/INH Aepb Commonly known as: ANORO ELLIPTA Inhale 1 puff into the lungs daily.   varenicline 1 MG tablet Commonly known as: CHANTIX   Vitamin D3 250 MCG (10000 UT) Tabs Take 10,000 Units by mouth daily.           If you experience worsening of your admission symptoms, develop shortness of breath, life threatening emergency, suicidal or homicidal thoughts you must seek medical attention immediately by calling 911 or calling your MD immediately  if symptoms less severe.   You must read complete instructions/literature along with all the possible adverse reactions/side effects for all the medicines you take and that have been prescribed to you. Take any new medicines after you have completely understood and accept all the  possible adverse reactions/side effects.    Please note   You were cared for by a hospitalist during your hospital stay. If you have any questions about your discharge medications or the care you received while you were in the hospital after you are discharged, you can call the unit and asked to speak with the hospitalist on call if the hospitalist that took care of you is not available. Once you are discharged, your primary care physician will handle any further medical issues. Please note that NO REFILLS for any discharge medications will be authorized once you  are discharged, as it is imperative that you return to your primary care physician (or establish a relationship with a primary care physician if you do not have one) for your aftercare needs so that they can reassess your need for medications and monitor your lab values.       Time coordinating discharge: 34 minutes  Signed:  Ryson Bacha  [facility] 07/23/2021, 3:29 PM   Pager on [email or web address]. If 7PM-7AM, please contact night-coverage at [email or web address]

## 2021-07-23 NOTE — Progress Notes (Incomplete)
Progress Note    Andrea Holden  [id number] DOB: 04-Dec-1945  DOA: 07/21/2021 PCP: Marguerita Merles, MD      Brief Narrative:    Medical records reviewed and are as summarized below:  Andrea Holden is a 76 y.o. female with medical history significant for CAD (previous NSTEMI), arrhythmia, mild aortic stenosis, diabetes mellitus, depression, anxiety, pneumonia, peripheral vascular disease, GERD, degenerative disc disease of lumbar spine,      Assessment/Plan:   Principal Problem:   NSTEMI (non-ST elevated myocardial infarction) ([place]) Active Problems:   Diabetes mellitus without complication ([place])   Hypertension with heart disease   CAD (coronary artery disease)    Body mass index is 24.56 kg/m.  Acute NSTEMI: Troponin was up to 4,188.  Continue IV heparin drip and monitor heparin level per protocol.  Continue aspirin, Plavix and Lipitor.  Acute on chronic diastolic CHF, small bilateral pleural effusions: BNP was 2,354.  Treat with IV Lasix.  Monitor BMP, daily weight and urine output.  Hyponatremia: Chart review shows that this is chronic.  Monitor BMP.  COPD: Continue bronchodilators  Stage II decubitus ulcer on the left buttock: Present on admission.  Continue local wound care  Other comorbidities include type II DM, depression, anxiety, PVD  Diet Order             Diet NPO time specified Except for: Sips with Meds  Diet effective midnight                      Consultants: Cardiologist  Procedures: None    Medications:    [MAR Hold] albuterol  2.5 mg Nebulization Q6H   [MAR Hold] aspirin EC  81 mg Oral Daily   [MAR Hold] atorvastatin  40 mg Oral q1800   [MAR Hold] clopidogrel  75 mg Oral Q breakfast   [MAR Hold] gabapentin  400 mg Oral BID   [MAR Hold] lisinopril  10 mg Oral Daily   [MAR Hold] metoprolol succinate  12.5 mg Oral Daily   [MAR Hold] montelukast  10 mg Oral QHS   [MAR Hold] nicotine  14 mg Transdermal Daily    [MAR Hold] sertraline  150 mg Oral Daily   [MAR Hold] sodium chloride flush  3 mL Intravenous Q12H   [MAR Hold] umeclidinium-vilanterol  1 puff Inhalation Daily   Continuous Infusions:  [MAR Hold] sodium chloride     sodium chloride 1 mL/kg/hr (07/23/21 0808)   heparin 1,350 Units/hr (07/23/21 0535)     Anti-infectives (From admission, onward)    None              Family Communication/Anticipated D/C date and plan/Code Status   DVT prophylaxis:      Code Status: Full Code  Family Communication: None Disposition Plan: Plan to discharge home in 2 to 3 days   Status is: Inpatient  Remains inpatient appropriate because: IV heparin and plan for heart cath           Subjective:   C/o shortness of breath.  Interval events noted.  No chest pain.  She said she had upper back pain prior to admission but this is better.  Objective:    Vitals:   07/23/21 1012 07/23/21 1042 07/23/21 1110 07/23/21 1136  BP: (!) 121/58  98/62   Pulse: 61 68 62   Resp:  20 17   Temp:   98.1 F (36.7 C)   TempSrc:   Oral   SpO2: 94%  92% 92% 97%  Weight:      Height:   5\' 1"  (1.549 m)    No data found.   Intake/Output Summary (Last 24 hours) at 07/23/2021 1146 Last data filed at 07/23/2021 0730 Gross per 24 hour  Intake 340.31 ml  Output 800 ml  Net -459.69 ml   Filed Weights   07/21/21 2135  Weight: 59 kg    Exam:  GEN: NAD SKIN: No rash.  Stage II left buttock decubitus ulcer EYES: EOMI ENT: MMM CV: RRR PULM: Bibasilar rales and wheezing ABD: soft, ND, NT, +BS CNS: AAO x 3, non focal EXT: No edema or tenderness    Pressure Injury 07/23/18 Stage II -  Partial thickness loss of dermis presenting as a shallow open ulcer with a red, pink wound bed without slough. pink, no drainage (Active)  07/23/18 1859  Location: Buttocks  Location Orientation: Left  Staging: Stage II -  Partial thickness loss of dermis presenting as a shallow open ulcer with a red,  pink wound bed without slough.  Wound Description (Comments): pink, no drainage  Present on Admission: Yes     Data Reviewed:   I have personally reviewed following labs and imaging studies:  Labs: Labs show the following:   Basic Metabolic Panel: Recent Labs  Lab 07/21/21 2138 07/22/21 0439 07/23/21 0430  NA 126* 126* 127*  K 3.8 3.9 3.9  CL 92* 95* 94*  CO2 26 25 25   GLUCOSE 159* 128* 111*  BUN 17 20 24*  CREATININE 0.62 0.56 0.68  CALCIUM 9.0 9.0 8.6*  MG  --   --  1.5*   GFR Estimated Creatinine Clearance: 50.9 mL/min (by C-G formula based on SCr of 0.68 mg/dL). Liver Function Tests: No results for input(s): AST, ALT, ALKPHOS, BILITOT, PROT, ALBUMIN in the last 168 hours. No results for input(s): LIPASE, AMYLASE in the last 168 hours. No results for input(s): AMMONIA in the last 168 hours. Coagulation profile Recent Labs  Lab 07/21/21 2329 07/22/21 0439  INR 1.1 1.1    CBC: Recent Labs  Lab 07/21/21 2138 07/22/21 0439 07/23/21 0430  WBC 12.0* 10.2 9.3  HGB 10.0* 9.7* 9.2*  HCT 29.9* 28.1* 27.5*  MCV 79.7* 79.8* 79.0*  PLT 204 194 181   Cardiac Enzymes: No results for input(s): CKTOTAL, CKMB, CKMBINDEX, TROPONINI in the last 168 hours. BNP (last 3 results) No results for input(s): PROBNP in the last 8760 hours. CBG: Recent Labs  Lab 07/23/21 1118  GLUCAP 122*   D-Dimer: No results for input(s): DDIMER in the last 72 hours. Hgb A1c: Recent Labs    07/22/21 0439  HGBA1C 6.0*   Lipid Profile: Recent Labs    07/22/21 0439  CHOL 107  HDL 46  LDLCALC 49  TRIG 58  CHOLHDL 2.3   Thyroid function studies: Recent Labs    07/21/21 2329  TSH 1.232   Anemia work up: No results for input(s): VITAMINB12, FOLATE, FERRITIN, TIBC, IRON, RETICCTPCT in the last 72 hours. Sepsis Labs: Recent Labs  Lab 07/21/21 2138 07/22/21 0439 07/23/21 0430  WBC 12.0* 10.2 9.3    Microbiology No results found for this or any previous visit (from the  past 240 hour(s)).  Procedures and diagnostic studies:  DG Chest 2 View  Result Date: 07/21/2021 CLINICAL DATA:  Shortness of breath. EXAM: CHEST - 2 VIEW COMPARISON:  Chest radiograph dated 09/06/2020. FINDINGS: Background of emphysema. Diffuse interstitial prominence may be chronic or represent mild edema. There is blunting of  the costophrenic angles which may represent small bilateral pleural effusions. No pneumothorax. The cardiac silhouette is within normal limits. Atherosclerotic calcification of the aorta. No acute osseous pathology. Degenerative changes of the spine. IMPRESSION: Emphysema with possible mild interstitial edema and small bilateral pleural effusions. Electronically Signed   By: Anner Crete M.D.   On: 07/21/2021 22:08               LOS: 2 days   Ronelle Michie  [facility]   Pager on [email or web address]. If 7PM-7AM, please contact night-coverage at [email or web address]     07/23/2021, 11:46 AM

## 2021-07-24 ENCOUNTER — Inpatient Hospital Stay: Payer: Medicare Other

## 2021-07-24 ENCOUNTER — Inpatient Hospital Stay (HOSPITAL_COMMUNITY)
Admission: AD | Admit: 2021-07-24 | Discharge: 2021-08-01 | DRG: 270 | Disposition: E | Payer: Medicare Other | Source: Other Acute Inpatient Hospital | Attending: Internal Medicine | Admitting: Internal Medicine

## 2021-07-24 ENCOUNTER — Encounter: Payer: Self-pay | Admitting: Cardiovascular Disease

## 2021-07-24 DIAGNOSIS — E785 Hyperlipidemia, unspecified: Secondary | ICD-10-CM | POA: Diagnosis present

## 2021-07-24 DIAGNOSIS — J9622 Acute and chronic respiratory failure with hypercapnia: Secondary | ICD-10-CM | POA: Diagnosis not present

## 2021-07-24 DIAGNOSIS — I959 Hypotension, unspecified: Secondary | ICD-10-CM

## 2021-07-24 DIAGNOSIS — Z79899 Other long term (current) drug therapy: Secondary | ICD-10-CM

## 2021-07-24 DIAGNOSIS — G9341 Metabolic encephalopathy: Secondary | ICD-10-CM | POA: Diagnosis present

## 2021-07-24 DIAGNOSIS — J9601 Acute respiratory failure with hypoxia: Secondary | ICD-10-CM | POA: Diagnosis not present

## 2021-07-24 DIAGNOSIS — Z515 Encounter for palliative care: Secondary | ICD-10-CM

## 2021-07-24 DIAGNOSIS — I25110 Atherosclerotic heart disease of native coronary artery with unstable angina pectoris: Secondary | ICD-10-CM | POA: Diagnosis present

## 2021-07-24 DIAGNOSIS — E871 Hypo-osmolality and hyponatremia: Secondary | ICD-10-CM | POA: Diagnosis present

## 2021-07-24 DIAGNOSIS — Z9884 Bariatric surgery status: Secondary | ICD-10-CM

## 2021-07-24 DIAGNOSIS — I25118 Atherosclerotic heart disease of native coronary artery with other forms of angina pectoris: Secondary | ICD-10-CM | POA: Diagnosis not present

## 2021-07-24 DIAGNOSIS — I272 Pulmonary hypertension, unspecified: Secondary | ICD-10-CM | POA: Diagnosis present

## 2021-07-24 DIAGNOSIS — K559 Vascular disorder of intestine, unspecified: Secondary | ICD-10-CM | POA: Diagnosis not present

## 2021-07-24 DIAGNOSIS — I110 Hypertensive heart disease with heart failure: Secondary | ICD-10-CM | POA: Diagnosis present

## 2021-07-24 DIAGNOSIS — E43 Unspecified severe protein-calorie malnutrition: Secondary | ICD-10-CM | POA: Diagnosis not present

## 2021-07-24 DIAGNOSIS — I5021 Acute systolic (congestive) heart failure: Secondary | ICD-10-CM

## 2021-07-24 DIAGNOSIS — J449 Chronic obstructive pulmonary disease, unspecified: Secondary | ICD-10-CM | POA: Diagnosis not present

## 2021-07-24 DIAGNOSIS — I119 Hypertensive heart disease without heart failure: Secondary | ICD-10-CM

## 2021-07-24 DIAGNOSIS — Z7982 Long term (current) use of aspirin: Secondary | ICD-10-CM

## 2021-07-24 DIAGNOSIS — R570 Cardiogenic shock: Secondary | ICD-10-CM | POA: Diagnosis not present

## 2021-07-24 DIAGNOSIS — I214 Non-ST elevation (NSTEMI) myocardial infarction: Secondary | ICD-10-CM | POA: Diagnosis not present

## 2021-07-24 DIAGNOSIS — I255 Ischemic cardiomyopathy: Secondary | ICD-10-CM | POA: Diagnosis present

## 2021-07-24 DIAGNOSIS — E119 Type 2 diabetes mellitus without complications: Secondary | ICD-10-CM

## 2021-07-24 DIAGNOSIS — I2510 Atherosclerotic heart disease of native coronary artery without angina pectoris: Secondary | ICD-10-CM | POA: Diagnosis not present

## 2021-07-24 DIAGNOSIS — L89150 Pressure ulcer of sacral region, unstageable: Secondary | ICD-10-CM | POA: Diagnosis present

## 2021-07-24 DIAGNOSIS — I5043 Acute on chronic combined systolic (congestive) and diastolic (congestive) heart failure: Secondary | ICD-10-CM | POA: Diagnosis not present

## 2021-07-24 DIAGNOSIS — Z9911 Dependence on respirator [ventilator] status: Secondary | ICD-10-CM | POA: Diagnosis not present

## 2021-07-24 DIAGNOSIS — I350 Nonrheumatic aortic (valve) stenosis: Secondary | ICD-10-CM

## 2021-07-24 DIAGNOSIS — Z9889 Other specified postprocedural states: Secondary | ICD-10-CM

## 2021-07-24 DIAGNOSIS — E1140 Type 2 diabetes mellitus with diabetic neuropathy, unspecified: Secondary | ICD-10-CM | POA: Diagnosis present

## 2021-07-24 DIAGNOSIS — E1165 Type 2 diabetes mellitus with hyperglycemia: Secondary | ICD-10-CM | POA: Diagnosis present

## 2021-07-24 DIAGNOSIS — J439 Emphysema, unspecified: Secondary | ICD-10-CM | POA: Diagnosis present

## 2021-07-24 DIAGNOSIS — F17210 Nicotine dependence, cigarettes, uncomplicated: Secondary | ICD-10-CM | POA: Diagnosis present

## 2021-07-24 DIAGNOSIS — Z7902 Long term (current) use of antithrombotics/antiplatelets: Secondary | ICD-10-CM

## 2021-07-24 DIAGNOSIS — Z8249 Family history of ischemic heart disease and other diseases of the circulatory system: Secondary | ICD-10-CM

## 2021-07-24 DIAGNOSIS — Z20822 Contact with and (suspected) exposure to covid-19: Secondary | ICD-10-CM | POA: Diagnosis not present

## 2021-07-24 DIAGNOSIS — J9621 Acute and chronic respiratory failure with hypoxia: Secondary | ICD-10-CM | POA: Diagnosis not present

## 2021-07-24 DIAGNOSIS — I517 Cardiomegaly: Secondary | ICD-10-CM

## 2021-07-24 DIAGNOSIS — F32A Depression, unspecified: Secondary | ICD-10-CM | POA: Diagnosis present

## 2021-07-24 DIAGNOSIS — D6489 Other specified anemias: Secondary | ICD-10-CM | POA: Diagnosis present

## 2021-07-24 DIAGNOSIS — Z7189 Other specified counseling: Secondary | ICD-10-CM | POA: Diagnosis not present

## 2021-07-24 DIAGNOSIS — K219 Gastro-esophageal reflux disease without esophagitis: Secondary | ICD-10-CM | POA: Diagnosis present

## 2021-07-24 DIAGNOSIS — Z4509 Encounter for adjustment and management of other cardiac device: Secondary | ICD-10-CM

## 2021-07-24 DIAGNOSIS — Z66 Do not resuscitate: Secondary | ICD-10-CM | POA: Diagnosis not present

## 2021-07-24 DIAGNOSIS — I5033 Acute on chronic diastolic (congestive) heart failure: Secondary | ICD-10-CM

## 2021-07-24 DIAGNOSIS — Z95828 Presence of other vascular implants and grafts: Secondary | ICD-10-CM

## 2021-07-24 DIAGNOSIS — Z90710 Acquired absence of both cervix and uterus: Secondary | ICD-10-CM

## 2021-07-24 DIAGNOSIS — Z6825 Body mass index (BMI) 25.0-25.9, adult: Secondary | ICD-10-CM

## 2021-07-24 DIAGNOSIS — J9600 Acute respiratory failure, unspecified whether with hypoxia or hypercapnia: Secondary | ICD-10-CM

## 2021-07-24 DIAGNOSIS — Z833 Family history of diabetes mellitus: Secondary | ICD-10-CM

## 2021-07-24 DIAGNOSIS — R54 Age-related physical debility: Secondary | ICD-10-CM | POA: Diagnosis present

## 2021-07-24 DIAGNOSIS — Z978 Presence of other specified devices: Secondary | ICD-10-CM

## 2021-07-24 DIAGNOSIS — I739 Peripheral vascular disease, unspecified: Secondary | ICD-10-CM | POA: Diagnosis not present

## 2021-07-24 DIAGNOSIS — J810 Acute pulmonary edema: Secondary | ICD-10-CM

## 2021-07-24 DIAGNOSIS — Z9049 Acquired absence of other specified parts of digestive tract: Secondary | ICD-10-CM

## 2021-07-24 DIAGNOSIS — E1151 Type 2 diabetes mellitus with diabetic peripheral angiopathy without gangrene: Secondary | ICD-10-CM | POA: Diagnosis present

## 2021-07-24 DIAGNOSIS — Z823 Family history of stroke: Secondary | ICD-10-CM

## 2021-07-24 DIAGNOSIS — F419 Anxiety disorder, unspecified: Secondary | ICD-10-CM | POA: Diagnosis present

## 2021-07-24 HISTORY — DX: Unspecified systolic (congestive) heart failure: I50.20

## 2021-07-24 HISTORY — DX: Atherosclerotic heart disease of native coronary artery without angina pectoris: I25.10

## 2021-07-24 HISTORY — DX: Vascular disorder of intestine, unspecified: K55.9

## 2021-07-24 HISTORY — DX: Nonrheumatic aortic (valve) stenosis: I35.0

## 2021-07-24 HISTORY — DX: Acute on chronic diastolic (congestive) heart failure: I50.33

## 2021-07-24 HISTORY — DX: Nonrheumatic mitral (valve) insufficiency: I34.0

## 2021-07-24 HISTORY — DX: Ischemic cardiomyopathy: I25.5

## 2021-07-24 LAB — SARS CORONAVIRUS 2 (TAT 6-24 HRS): SARS Coronavirus 2: NEGATIVE

## 2021-07-24 LAB — BASIC METABOLIC PANEL
Anion gap: 8 (ref 5–15)
BUN: 33 mg/dL — ABNORMAL HIGH (ref 8–23)
CO2: 23 mmol/L (ref 22–32)
Calcium: 8.6 mg/dL — ABNORMAL LOW (ref 8.9–10.3)
Chloride: 94 mmol/L — ABNORMAL LOW (ref 98–111)
Creatinine, Ser: 0.94 mg/dL (ref 0.44–1.00)
GFR, Estimated: >60 mL/min (ref >60)
Glucose, Bld: 248 mg/dL — ABNORMAL HIGH (ref 70–99)
Potassium: 4 mmol/L (ref 3.5–5.1)
Sodium: 125 mmol/L — ABNORMAL LOW (ref 135–145)

## 2021-07-24 LAB — GLUCOSE, CAPILLARY
Glucose-Capillary: 262 mg/dL — ABNORMAL HIGH (ref 70–99)
Glucose-Capillary: 213 mg/dL — ABNORMAL HIGH (ref 70–99)
Glucose-Capillary: 181 mg/dL — ABNORMAL HIGH (ref 70–99)

## 2021-07-24 LAB — CBC
HCT: 32.8 % — ABNORMAL LOW (ref 36.0–46.0)
Hemoglobin: 10.8 g/dL — ABNORMAL LOW (ref 12.0–15.0)
MCH: 27 pg (ref 26.0–34.0)
MCHC: 32.9 g/dL (ref 30.0–36.0)
MCV: 82 fL (ref 80.0–100.0)
Platelets: 285 10*3/uL (ref 150–400)
RBC: 4 MIL/uL (ref 3.87–5.11)
RDW: 14.8 % (ref 11.5–15.5)
WBC: 16.3 10*3/uL — ABNORMAL HIGH (ref 4.0–10.5)
nRBC: 0 % (ref 0.0–0.2)

## 2021-07-24 LAB — HEPARIN LEVEL (UNFRACTIONATED)
Heparin Unfractionated: 0.24 IU/mL — ABNORMAL LOW (ref 0.30–0.70)
Heparin Unfractionated: 0.42 IU/mL (ref 0.30–0.70)
Heparin Unfractionated: 0.42 IU/mL (ref 0.30–0.70)

## 2021-07-24 LAB — MRSA NEXT GEN BY PCR, NASAL: MRSA by PCR Next Gen: NOT DETECTED

## 2021-07-24 LAB — LACTIC ACID, PLASMA: Lactic Acid, Venous: 1.2 mmol/L (ref 0.5–1.9)

## 2021-07-24 MED ORDER — LOSARTAN POTASSIUM 25 MG PO TABS
12.5000 mg | ORAL_TABLET | Freq: Every day | ORAL | Status: DC
  Administered 2021-07-24: 09:00:00 12.5 mg via ORAL
  Filled 2021-07-24: qty 1

## 2021-07-24 MED ORDER — MORPHINE SULFATE (PF) 2 MG/ML IV SOLN
1.0000 mg | Freq: Once | INTRAVENOUS | Status: AC
  Administered 2021-07-24: 12:00:00 1 mg via INTRAVENOUS

## 2021-07-24 MED ORDER — ALPRAZOLAM 0.25 MG PO TABS
0.2500 mg | ORAL_TABLET | Freq: Once | ORAL | Status: AC
  Administered 2021-07-24: 11:00:00 0.25 mg via ORAL
  Filled 2021-07-24: qty 1

## 2021-07-24 MED ORDER — FUROSEMIDE 10 MG/ML IJ SOLN
INTRAMUSCULAR | Status: AC
  Filled 2021-07-24: qty 4

## 2021-07-24 MED ORDER — ALBUTEROL SULFATE (2.5 MG/3ML) 0.083% IN NEBU
2.5000 mg | INHALATION_SOLUTION | Freq: Three times a day (TID) | RESPIRATORY_TRACT | Status: DC
  Administered 2021-07-24 (×2): 2.5 mg via RESPIRATORY_TRACT
  Filled 2021-07-24 (×3): qty 3

## 2021-07-24 MED ORDER — METHYLPREDNISOLONE SODIUM SUCC 40 MG IJ SOLR: 40.0000 mg | Freq: Two times a day (BID) | INTRAMUSCULAR | Status: DC

## 2021-07-24 MED ORDER — HEPARIN BOLUS VIA INFUSION
900.0000 [IU] | Freq: Once | INTRAVENOUS | Status: AC
  Administered 2021-07-24: 02:00:00 900 [IU] via INTRAVENOUS
  Filled 2021-07-24: qty 900

## 2021-07-24 MED ORDER — LOPERAMIDE HCL 2 MG PO CAPS
2.0000 mg | ORAL_CAPSULE | Freq: Four times a day (QID) | ORAL | Status: DC | PRN
  Administered 2021-07-24: 11:00:00 2 mg via ORAL
  Filled 2021-07-24: qty 1

## 2021-07-24 MED ORDER — INSULIN ASPART 100 UNIT/ML IJ SOLN
0.0000 [IU] | Freq: Three times a day (TID) | INTRAMUSCULAR | Status: DC
  Administered 2021-07-24: 17:00:00 3 [IU] via SUBCUTANEOUS
  Filled 2021-07-24: qty 1

## 2021-07-24 MED ORDER — MORPHINE SULFATE (PF) 2 MG/ML IV SOLN
INTRAVENOUS | Status: AC
  Filled 2021-07-24: qty 1

## 2021-07-24 MED ORDER — CHLORHEXIDINE GLUCONATE CLOTH 2 % EX PADS
6.0000 | MEDICATED_PAD | Freq: Every day | CUTANEOUS | Status: DC
  Administered 2021-07-24: 13:00:00 6 via TOPICAL

## 2021-07-24 MED ORDER — FUROSEMIDE 10 MG/ML IJ SOLN
40.0000 mg | Freq: Once | INTRAMUSCULAR | Status: AC
  Administered 2021-07-24: 12:00:00 40 mg via INTRAVENOUS

## 2021-07-24 MED ORDER — METHYLPREDNISOLONE SODIUM SUCC 40 MG IJ SOLR
40.0000 mg | Freq: Four times a day (QID) | INTRAMUSCULAR | Status: DC
  Administered 2021-07-24: 13:00:00 40 mg via INTRAVENOUS
  Filled 2021-07-24: qty 1

## 2021-07-24 MED ORDER — NITROGLYCERIN 2 % TD OINT
1.0000 [in_us] | TOPICAL_OINTMENT | Freq: Four times a day (QID) | TRANSDERMAL | Status: DC
  Administered 2021-07-24: 12:00:00 1 [in_us] via TOPICAL
  Filled 2021-07-24: qty 1

## 2021-07-24 MED ORDER — FUROSEMIDE 10 MG/ML IJ SOLN
20.0000 mg | Freq: Once | INTRAMUSCULAR | Status: AC
  Administered 2021-07-24: 11:00:00 20 mg via INTRAVENOUS
  Filled 2021-07-24: qty 2

## 2021-07-24 NOTE — Progress Notes (Signed)
Notified [place] that patient's room changed to 4E Room 10.

## 2021-07-24 NOTE — Progress Notes (Signed)
Pt arrived on the unit from [place]. Pt denies pain. Pt made comfortable. CCMD called. Lendell Caprice, MD notified. Awaiting response.

## 2021-07-24 NOTE — Progress Notes (Signed)
[facility] here for patient transport. Called [facility] unit for update on patient arrival

## 2021-07-24 NOTE — Progress Notes (Signed)
Report called to Russ Halo, RN for patient to go to room 2C room 5.

## 2021-07-24 NOTE — Progress Notes (Signed)
Patient asked me to call [place] and update her.  [place] updated and notified of room number and # to nurses station.

## 2021-07-24 NOTE — Progress Notes (Addendum)
On the floor rounding when nursing report rapid response had been called on Ms. Andrea Holden.  On arrival was diaphoretic, respiratory distress, worsening mentation On nasal cannula oxygen turned up, saturations low 80s Blood pressure 169 up to 175 systolic, normal sinus rhythm  EKG ordered and performed at the bedside, normal sinus rhythm worsening ST depressions anterolateral leads  Patient reported having 5-7 over 10 chest pain, coughing, short of breath  On exam increased work of breathing, coarse breath sounds bilaterally, normal sinus rhythm murmur appreciated, abdomen nonacute, no lower extremity edema  Was given nitroglycerin x2, Nitropaste started, Had received nebulizer treatment for wheezing, She has received Lasix 20 mg IV push x1, I have ordered additional Lasix 40 IV x1  Bedside chest x-ray reviewed showing diffuse pulmonary edema Small right greater than left pleural effusion  Discussed with ICU team, hospitalist service, nursing Given persistent hypoxia, poor mentation, respiratory distress, decision made to move her to the ICU, place her on BiPAP  In the ICU placed on BiPAP, saturations up to the mid to high 90s within several minutes, mild improvement in mentation  BiPAP taken off after 1.5 hours, better mentation, changed to high flow nasal cannula  Blood pressure dramatically improved, At 4 PM blood pressure now low will discontinue Nitropaste Urine not collected but bed was wet in ICU Patient resting comfortably per nursing   Total encounter time more than 60 minutes  Greater than 50% was spent in counseling and coordination of care with the patient   Signed, Dossie Arbour, MD, Ph.D [facility]

## 2021-07-24 NOTE — Significant Event (Signed)
Rapid Response Event Note   Reason for Call :  Sudden onset chest pain and shortness of breath, increased work of breathing,   Initial Focused Assessment:  Rapid response RN arrived with RT in patient's room with patient sitting up in bed with 2A staff at bedside. Patient working to breathe on NRB mask at 15L. 2A staff in the process of getting a 12 lead EKG. Per 2A staff patient had sudden onset shortness of breath, chest pressure 7/10, dropped oxygen saturations to 70s on nasal cannula, and increased work of breathing. 2A staff placed patient on 15 L NRB. Vitals at 11:36 162/89 MAP 108, HR 86, HR 99% on 15 L NRB lots of accessory muscle use and increased work of breathing. Lungs with pleural friction rub on left side and highly diminished on right side.  Interventions:  Patient given one dose of sublingual nitro at 11:36. No change in pain/pressure per patient at 11:41 (vitals HR 85, BP 159/86 MAP 105 97% oxygen saturation on 15 L NRB). By 11:38 Dr. Mariah Milling at bedside. Ordered another dose of lasix IV, nitro paste, morphine IV, Bipap, and transfer to stepdown. Per Dr. Mariah Milling, patient given another dose of nitro at 11:58 just before patient transferred to ICU. Once arrived in ICU patient, placed on bipap by Weston Brass RT.  Plan of Care:  Transferred to ICU 12. Tresa Endo RN gave report to [employer]. Administrative Coordinator Larena Sox called to check on bed status for transfer but no updates on timeframe for patient getting bed at [facility].  Event Summary:   MD Notified: Dr. Mariah Milling and Dr. Myriam Forehand Call Time:11:31 Arrival Time: 11:32 End Time: 12:08  Bennie Dallas, RN

## 2021-07-24 NOTE — Progress Notes (Addendum)
Progress Note    Andrea Holden  [id number] DOB: 02/27/1946  DOA: 07/21/2021 PCP: Marguerita Merles, MD      Brief Narrative:    Medical records reviewed and are as summarized below:  Andrea Holden is a 76 y.o. female with medical history significant for CAD (previous NSTEMI), arrhythmia, mild aortic stenosis, diabetes mellitus, depression, anxiety, pneumonia, peripheral vascular disease, GERD, degenerative disc disease of lumbar spine,      Assessment/Plan:   Principal Problem:   NSTEMI (non-ST elevated myocardial infarction) ([place]) Active Problems:   Diabetes mellitus without complication ([place])   Aortic stenosis   Hypertension with heart disease   CAD (coronary artery disease)   Acute on chronic diastolic CHF (congestive heart failure) ([place])   Acute systolic CHF (congestive heart failure) ([place])    Body mass index is 25.7 kg/m.  Acute NSTEMI: S/p left and right heart cath revealed critical left main stenosis (95%) with significant considerations, moderately to severely reduced LV systolic function with EF of 30 to 35%, moderate to severe mitral regurgitation, severe aortic stenosis, moderate pulmonary hypertension.   Continue IV heparin and monitor heparin level per protocol.  Continue aspirin and Lipitor.  Plan to transfer to [facility] for further management.  Acute on chronic diastolic CHF, acute systolic CHF, small bilateral pleural effusions, severe aortic stenosis, moderate to severe mitral regurgitation: BNP was 2,354.  Repeat chest x-ray today showed bilateral pulmonary edema.  Restart IV Lasix.  Monitor BMP, daily weight and urine output.  Acute hypoxemic respiratory failure: She was raised from oxygen via nasal collar to BiPAP because of worsening hypoxemic respiratory failure.  She has been transferred to stepdown unit for further management.  Hyponatremia: Chart review shows that this is chronic.  Monitor BMP.  COPD: Continue  bronchodilators.  Add IV steroids for suspected exacerbation.  Stage II decubitus ulcer on the left buttock: Present on admission.  Continue local wound care  Other comorbidities include type II DM, depression, anxiety, PVD  Plan discussed with Dr. Rockey Situ, cardiologist, at the bedside during rapid response     CRITICAL CARE Performed by: Jennye Boroughs   Total critical care time: 35 minutes  Critical care time was exclusive of separately billable procedures and treating other patients.  Critical care was necessary to treat or prevent imminent or life-threatening deterioration.  Critical care was time spent personally by me on the following activities: development of treatment plan with patient and/or surrogate as well as nursing, discussions with consultants, evaluation of patient's response to treatment, examination of patient, obtaining history from patient or surrogate, ordering and performing treatments and interventions, ordering and review of laboratory studies, ordering and review of radiographic studies, pulse oximetry and re-evaluation of patient's condition.   Diet Order             Diet regular Room service appropriate? Yes; Fluid consistency: Thin  Diet effective now           Diet - low sodium heart healthy                      Consultants: Cardiologist  Procedures: None    Medications:    albuterol  2.5 mg Nebulization TID   aspirin EC  81 mg Oral Daily   atorvastatin  40 mg Oral q1800   Chlorhexidine Gluconate Cloth  6 each Topical Daily   gabapentin  400 mg Oral BID   Gerhardt's butt cream   Topical BID  losartan  12.5 mg Oral Daily   methylPREDNISolone (SOLU-MEDROL) injection  40 mg Intravenous Q6H   metoprolol succinate  12.5 mg Oral Daily   montelukast  10 mg Oral QHS   nicotine  14 mg Transdermal Daily   nitroGLYCERIN  1 inch Topical Q6H   sertraline  150 mg Oral Daily   sodium chloride flush  3 mL Intravenous Q12H   sodium chloride  flush  3 mL Intravenous Q12H   umeclidinium-vilanterol  1 puff Inhalation Daily   Continuous Infusions:  sodium chloride     sodium chloride     heparin 1,450 Units/hr (07/05/2021 1242)     Anti-infectives (From admission, onward)    None              Family Communication/Anticipated D/C date and plan/Code Status   DVT prophylaxis:      Code Status: Full Code  Family Communication: Plan discussed with Ms. Caroll Rancher, daughter, over the phone Disposition Plan: Plan to transfer to [facility]   Status is: Inpatient  Remains inpatient appropriate because: IV heparin and acute respiratory failure           Subjective:   C/o cough, chest pain and shortness of breath  Objective:    Vitals:   07/16/2021 0826 07/28/2021 1100 07/08/2021 1208 07/31/2021 1220  BP: (!) 101/54 137/86  (!) 152/80  Pulse: 70 84 92 92  Resp: 16 (!) 24 20 19   Temp: 98.7 F (37.1 C)   (!) 96.4 F (35.8 C)  TempSrc: Oral   Axillary  SpO2: 93% 90% 99% 100%  Weight:    61.7 kg  Height:    5\' 1"  (1.549 m)   No data found.   Intake/Output Summary (Last 24 hours) at 07/06/2021 1326 Last data filed at 07/01/2021 1242 Gross per 24 hour  Intake 300.28 ml  Output 1 ml  Net 299.28 ml   Filed Weights   07/21/21 2135 07/21/2021 1220  Weight: 59 kg 61.7 kg    Exam:  GEN: She is in respiratory distress SKIN: No rash EYES: EOMI ENT: MMM CV: RRR PULM: Using accessory muscles on 10 L/min oxygen.  Bibasilar rales and bilateral wheezing ABD: soft, ND, NT, +BS CNS: AAO x 3, non focal EXT: No edema or tenderness      Pressure Injury 07/23/18 Stage II -  Partial thickness loss of dermis presenting as a shallow open ulcer with a red, pink wound bed without slough. pink, no drainage (Active)  07/23/18 1859  Location: Buttocks  Location Orientation: Left  Staging: Stage II -  Partial thickness loss of dermis presenting as a shallow open ulcer with a red, pink wound bed without  slough.  Wound Description (Comments): pink, no drainage  Present on Admission: Yes     Data Reviewed:   I have personally reviewed following labs and imaging studies:  Labs: Labs show the following:   Basic Metabolic Panel: Recent Labs  Lab 07/21/21 2138 07/22/21 0439 07/23/21 0430 07/13/2021 1228  NA 126* 126* 127* 125*  K 3.8 3.9 3.9 4.0  CL 92* 95* 94* 94*  CO2 26 25 25 23   GLUCOSE 159* 128* 111* 248*  BUN 17 20 24* 33*  CREATININE 0.62 0.56 0.68 0.94  CALCIUM 9.0 9.0 8.6* 8.6*  MG  --   --  1.5*  --    GFR Estimated Creatinine Clearance: 44.3 mL/min (by C-G formula based on SCr of 0.94 mg/dL). Liver Function Tests: No results for input(s):  AST, ALT, ALKPHOS, BILITOT, PROT, ALBUMIN in the last 168 hours. No results for input(s): LIPASE, AMYLASE in the last 168 hours. No results for input(s): AMMONIA in the last 168 hours. Coagulation profile Recent Labs  Lab 07/21/21 2329 07/22/21 0439  INR 1.1 1.1    CBC: Recent Labs  Lab 07/21/21 2138 07/22/21 0439 07/23/21 0430 07/11/2021 1228  WBC 12.0* 10.2 9.3 16.3*  HGB 10.0* 9.7* 9.2* 10.8*  HCT 29.9* 28.1* 27.5* 32.8*  MCV 79.7* 79.8* 79.0* 82.0  PLT 204 194 181 285   Cardiac Enzymes: No results for input(s): CKTOTAL, CKMB, CKMBINDEX, TROPONINI in the last 168 hours. BNP (last 3 results) No results for input(s): PROBNP in the last 8760 hours. CBG: Recent Labs  Lab 07/23/21 1118 07/25/2021 1222  GLUCAP 122* 262*   D-Dimer: No results for input(s): DDIMER in the last 72 hours. Hgb A1c: Recent Labs    07/22/21 0439  HGBA1C 6.0*   Lipid Profile: Recent Labs    07/22/21 0439  CHOL 107  HDL 46  LDLCALC 49  TRIG 58  CHOLHDL 2.3   Thyroid function studies: Recent Labs    07/21/21 2329  TSH 1.232   Anemia work up: No results for input(s): VITAMINB12, FOLATE, FERRITIN, TIBC, IRON, RETICCTPCT in the last 72 hours. Sepsis Labs: Recent Labs  Lab 07/21/21 2138 07/22/21 0439 07/23/21 0430  07/12/2021 1228  WBC 12.0* 10.2 9.3 16.3*  LATICACIDVEN  --   --   --  1.2    Microbiology No results found for this or any previous visit (from the past 240 hour(s)).  Procedures and diagnostic studies:  CARDIAC CATHETERIZATION  Result Date: 07/23/2021   Mid LM lesion is 95% stenosed.   Prox LAD to Mid LAD lesion is 70% stenosed.   Ost RCA to Prox RCA lesion is 20% stenosed.   There is moderate to severe left ventricular systolic dysfunction.   LV end diastolic pressure is moderately elevated.   The left ventricular ejection fraction is 25-35% by visual estimate. 1.  Critical left main stenosis with significant calcifications.  No obstructive disease involving the right coronary artery. 2.  Moderately to severely reduced LV systolic function with an EF of 30 to 35% with global hypokinesis.  Moderate to severe mitral regurgitation. 3.  Severe aortic stenosis with mean gradient of 34 mmHg and valve area of 0.44. 4.  Right heart catheterization showed moderately elevated wedge pressure, moderate pulmonary hypertension and severely reduced cardiac output.  Prominent V waves on pulmonary wedge tracing suggestive of significant mitral regurgitation. Recommendations: Transfer to [facility] for evaluation of CABG plus AVR versus left main stenting plus TAVR depending on candidacy for surgery.  She does have underlying COPD and also seem to have poor nutrition and weight loss and thus likely a high risk surgical candidate. Resume heparin at 4:30 PM.  Proceed with echocardiogram.   DG Chest Port 1 View  Result Date: 07/23/2021 CLINICAL DATA:  Respiratory distress EXAM: PORTABLE CHEST 1 VIEW COMPARISON:  Chest x-ray dated July 21, 2021 FINDINGS: Visualized cardiac and mediastinal contours are unchanged. New diffuse bilateral heterogeneous opacities. Small right-greater-than-left pleural effusions. IMPRESSION: 1. New diffuse bilateral heterogeneous opacities, likely due to worsening pulmonary edema.  2. Small right-greater-than-left pleural effusions. Electronically Signed   By: Yetta Glassman M.D.   On: 07/02/2021 12:07   ECHOCARDIOGRAM COMPLETE  Result Date: 07/23/2021    ECHOCARDIOGRAM REPORT   Patient Name:   ARICELA TORREALBA Date of Exam: 07/23/2021 Medical Rec #:  [id number]          Height:       61.0 in Accession #:    [id number]         Weight:       130.0 lb Date of Birth:  Nov 30, 1945          BSA:          1.573 m Patient Age:    43 years           BP:           94/53 mmHg Patient Gender: F                  HR:           78 bpm. Exam Location:  [facility] Procedure: 2D Echo, Cardiac Doppler and Color Doppler Indications:     NSTEMI I21.4  History:         Patient has prior history of Echocardiogram examinations, most                  recent 06/04/2018. Signs/Symptoms:Murmur; Risk                  Factors:Hypertension.  Sonographer:     Sherrie Sport Referring Phys:  [place] Diagnosing Phys: Kathlyn Sacramento MD  Sonographer Comments: Suboptimal apical window. IMPRESSIONS  1. Left ventricular ejection fraction, by estimation, is 30 to 35%. The left ventricle has moderately decreased function. The left ventricle demonstrates regional wall motion abnormalities (see scoring diagram/findings for description). There is mild left ventricular hypertrophy. Left ventricular diastolic parameters are consistent with Grade II diastolic dysfunction (pseudonormalization). There is severe hypokinesis of the left ventricular, mid-apical anteroseptal wall, anterior wall, anterior segment and apical segment.  2. Right ventricular systolic function is normal. The right ventricular size is normal. There is mildly elevated pulmonary artery systolic pressure.  3. Left atrial size was mildly dilated.  4. The mitral valve is abnormal. Moderate mitral valve regurgitation. No evidence of mitral stenosis. Severe mitral annular calcification.  5. The aortic valve is calcified. Aortic valve regurgitation is not visualized.  Severe aortic valve stenosis. Aortic valve area, by VTI measures 0.64 cm. Aortic valve mean gradient measures 34.2 mmHg. FINDINGS  Left Ventricle: Left ventricular ejection fraction, by estimation, is 30 to 35%. The left ventricle has moderately decreased function. The left ventricle demonstrates regional wall motion abnormalities. Severe hypokinesis of the left ventricular, mid-apical anteroseptal wall, anterior wall, anterior segment and apical segment. The left ventricular internal cavity size was normal in size. There is mild left ventricular hypertrophy. Left ventricular diastolic parameters are consistent with Grade II  diastolic dysfunction (pseudonormalization). Right Ventricle: The right ventricular size is normal. No increase in right ventricular wall thickness. Right ventricular systolic function is normal. There is mildly elevated pulmonary artery systolic pressure. The tricuspid regurgitant velocity is 3.14  m/s, and with an assumed right atrial pressure of 5 mmHg, the estimated right ventricular systolic pressure is [id number] mmHg. Left Atrium: Left atrial size was mildly dilated. Right Atrium: Right atrial size was normal in size. Pericardium: There is no evidence of pericardial effusion. Mitral Valve: The mitral valve is abnormal. There is moderate thickening of the mitral valve leaflet(s). There is moderate calcification of the mitral valve leaflet(s). Severe mitral annular calcification. Moderate mitral valve regurgitation. No evidence  of mitral valve stenosis. MV peak gradient, 8.5 mmHg. The mean mitral valve gradient is 3.0 mmHg. Tricuspid Valve: The tricuspid valve is normal  in structure. Tricuspid valve regurgitation is mild . No evidence of tricuspid stenosis. Aortic Valve: The aortic valve is calcified. Aortic valve regurgitation is not visualized. Severe aortic stenosis is present. Aortic valve mean gradient measures 34.2 mmHg. Aortic valve peak gradient measures 51.2 mmHg. Aortic valve area,  by VTI measures  0.64 cm. Pulmonic Valve: The pulmonic valve was normal in structure. Pulmonic valve regurgitation is not visualized. No evidence of pulmonic stenosis. Aorta: The aortic root is normal in size and structure. Venous: The inferior vena cava was not well visualized. IAS/Shunts: No atrial level shunt detected by color flow Doppler.  LEFT VENTRICLE PLAX 2D LVIDd:         5.20 cm   Diastology LVIDs:         3.80 cm   LV e' medial:    4.24 cm/s LV PW:         1.00 cm   LV E/e' medial:  26.9 LV IVS:        0.95 cm   LV e' lateral:   3.81 cm/s LVOT diam:     2.00 cm   LV E/e' lateral: 29.9 LV SV:         56 LV SV Index:   35 LVOT Area:     3.14 cm  RIGHT VENTRICLE RV Basal diam:  4.35 cm RV S prime:     14.30 cm/s TAPSE (M-mode): 3.0 cm LEFT ATRIUM             Index        RIGHT ATRIUM           Index LA diam:        4.70 cm 2.99 cm/m   RA Area:     14.30 cm LA Vol (A2C):   75.6 ml 48.07 ml/m  RA Volume:   35.80 ml  22.76 ml/m LA Vol (A4C):   50.4 ml 32.05 ml/m LA Biplane Vol: 67.4 ml 42.85 ml/m  AORTIC VALVE                     PULMONIC VALVE AV Area (Vmax):    0.57 cm      PV Vmax:        0.92 m/s AV Area (Vmean):   0.53 cm      PV Vmean:       61.800 cm/s AV Area (VTI):     0.64 cm      PV VTI:         0.171 m AV Vmax:           357.75 cm/s   PV Peak grad:   3.4 mmHg AV Vmean:          281.000 cm/s  PV Mean grad:   2.0 mmHg AV VTI:            0.866 m       RVOT Peak grad: 4 mmHg AV Peak Grad:      51.2 mmHg AV Mean Grad:      34.2 mmHg LVOT Vmax:         64.60 cm/s LVOT Vmean:        47.800 cm/s LVOT VTI:          0.177 m LVOT/AV VTI ratio: 0.20  AORTA Ao Root diam: 3.00 cm MITRAL VALVE                TRICUSPID VALVE MV Area (PHT): 3.45 cm     TR  Peak grad:   39.4 mmHg MV Area VTI:   1.36 cm     TR Vmax:        314.00 cm/s MV Peak grad:  8.5 mmHg MV Mean grad:  3.0 mmHg     SHUNTS MV Vmax:       1.46 m/s     Systemic VTI:  0.18 m MV Vmean:      78.6 cm/s    Systemic Diam: 2.00 cm MV Decel  Time: 220 msec     Pulmonic VTI:  0.192 m MV E velocity: 114.00 cm/s MV A velocity: 96.40 cm/s MV E/A ratio:  1.18 Kathlyn Sacramento MD Electronically signed by Kathlyn Sacramento MD Signature Date/Time: 07/23/2021/2:01:59 PM    Final                LOS: 3 days   Nuala Chiles  [facility]   Pager on [email or web address]. If 7PM-7AM, please contact night-coverage at [email or web address]     07/08/2021, 1:26 PM

## 2021-07-24 NOTE — Progress Notes (Signed)
Responded to RRT page.  On arrival,  Icu CN, resp. Therapy, patient RN present. Contacted Dawn in Patient Placement. No bed available at this time. Awaiting D/C at [facility].

## 2021-07-24 NOTE — Progress Notes (Addendum)
Progress Note  Patient Name: LAINEE WISHON Date of Encounter: 07/29/2021  [facility] Cardiologist: Ida Rogue, MD   Subjective   No chest pain this morning Productive cough, reports having chronic cough and bronchitis symptoms, clears out every morning Long history of smoking, COPD Discussed catheterization results yesterday, severe left main disease [id number], LV systolic dysfunction ejection fraction 25 to 35%, 70% proximal to mid LAD Also with severe aortic valve stenosis on echo  Inpatient Medications    Scheduled Meds:  albuterol  2.5 mg Nebulization TID   aspirin EC  81 mg Oral Daily   atorvastatin  40 mg Oral q1800   gabapentin  400 mg Oral BID   Gerhardt's butt cream   Topical BID   losartan  12.5 mg Oral Daily   metoprolol succinate  12.5 mg Oral Daily   montelukast  10 mg Oral QHS   nicotine  14 mg Transdermal Daily   sertraline  150 mg Oral Daily   sodium chloride flush  3 mL Intravenous Q12H   sodium chloride flush  3 mL Intravenous Q12H   umeclidinium-vilanterol  1 puff Inhalation Daily   Continuous Infusions:  sodium chloride     sodium chloride     heparin 1,450 Units/hr (07/18/2021 0722)   PRN Meds: sodium chloride, sodium chloride, acetaminophen, fluticasone, ipratropium-albuterol, nitroGLYCERIN, ondansetron (ZOFRAN) IV, sodium chloride flush, sodium chloride flush   Vital Signs    Vitals:   07/23/2021 0435 07/18/2021 0455 07/07/2021 0735 07/09/2021 0738  BP: (!) 109/57   (!) 104/56  Pulse: 63 70 66 64  Resp: 15  16 16   Temp: 98.8 F (37.1 C)   98.7 F (37.1 C)  TempSrc:    Oral  SpO2: 92% 97% 94% 94%  Weight:      Height:        Intake/Output Summary (Last 24 hours) at 07/16/2021 0909 Last data filed at 07/19/2021 [id number] Gross per 24 hour  Intake 209.65 ml  Output 1 ml  Net 208.65 ml   Last 3 Weights 07/21/2021 03/28/2021 09/06/2020  Weight (lbs) 130 lb 145 lb 1 oz 145 lb  Weight (kg) 58.968 kg 65.8 kg 65.772 kg      Telemetry    Normal  sinus rhythm- Personally Reviewed  ECG     - Personally Reviewed  Physical Exam   GEN: No acute distress.   Neck: No JVD Cardiac: RRR, 2-3/6 SEM RSB,  no rubs, or gallops.  Respiratory: Clear to auscultation bilaterally. GI: Soft, nontender, non-distended  MS: No edema; No deformity. Neuro:  Nonfocal  Psych: Normal affect   Labs    High Sensitivity Troponin:   Recent Labs  Lab 07/21/21 2138 07/21/21 2329  TROPONINIHS 3,727* 4,188*     Chemistry Recent Labs  Lab 07/21/21 2138 07/22/21 0439 07/23/21 0430  NA 126* 126* 127*  K 3.8 3.9 3.9  CL 92* 95* 94*  CO2 26 25 25   GLUCOSE 159* 128* 111*  BUN 17 20 24*  CREATININE 0.62 0.56 0.68  CALCIUM 9.0 9.0 8.6*  MG  --   --  1.5*  GFRNONAA >60 >60 >60  ANIONGAP 8 6 8     Lipids  Recent Labs  Lab 07/22/21 0439  CHOL 107  TRIG 58  HDL 46  LDLCALC 49  CHOLHDL 2.3    Hematology Recent Labs  Lab 07/21/21 2138 07/22/21 0439 07/23/21 0430  WBC 12.0* 10.2 9.3  RBC 3.75* 3.52* 3.48*  HGB 10.0* 9.7* 9.2*  HCT 29.9*  28.1* 27.5*  MCV 79.7* 79.8* 79.0*  MCH 26.7 27.6 26.4  MCHC 33.4 34.5 33.5  RDW 14.4 14.6 14.7  PLT 204 194 181   Thyroid  Recent Labs  Lab 07/21/21 2329  TSH 1.232    BNP Recent Labs  Lab 07/21/21 2329  BNP 2,354.7*    DDimer No results for input(s): DDIMER in the last 168 hours.   Radiology    CARDIAC CATHETERIZATION  Result Date: 07/23/2021   Mid LM lesion is 95% stenosed.   Prox LAD to Mid LAD lesion is 70% stenosed.   Ost RCA to Prox RCA lesion is 20% stenosed.   There is moderate to severe left ventricular systolic dysfunction.   LV end diastolic pressure is moderately elevated.   The left ventricular ejection fraction is 25-35% by visual estimate. 1.  Critical left main stenosis with significant calcifications.  No obstructive disease involving the right coronary artery. 2.  Moderately to severely reduced LV systolic function with an EF of 30 to 35% with global hypokinesis.   Moderate to severe mitral regurgitation. 3.  Severe aortic stenosis with mean gradient of 34 mmHg and valve area of 0.44. 4.  Right heart catheterization showed moderately elevated wedge pressure, moderate pulmonary hypertension and severely reduced cardiac output.  Prominent V waves on pulmonary wedge tracing suggestive of significant mitral regurgitation. Recommendations: Transfer to [facility] for evaluation of CABG plus AVR versus left main stenting plus TAVR depending on candidacy for surgery.  She does have underlying COPD and also seem to have poor nutrition and weight loss and thus likely a high risk surgical candidate. Resume heparin at 4:30 PM.  Proceed with echocardiogram.   ECHOCARDIOGRAM COMPLETE  Result Date: 07/23/2021    ECHOCARDIOGRAM REPORT   Patient Name:   JEMINI REDMAN [facility] Date of Exam: 07/23/2021 Medical Rec #:  [id number]          Height:       61.0 in Accession #:    [id number]         Weight:       130.0 lb Date of Birth:  11/20/1945          BSA:          1.573 m Patient Age:    76 years           BP:           94/53 mmHg Patient Gender: F                  HR:           78 bpm. Exam Location:  [facility] Procedure: 2D Echo, Cardiac Doppler and Color Doppler Indications:     NSTEMI I21.4  History:         Patient has prior history of Echocardiogram examinations, most                  recent 06/04/2018. Signs/Symptoms:Murmur; Risk                  Factors:Hypertension.  Sonographer:     Sherrie Sport Referring Phys:  [place] Diagnosing Phys: Kathlyn Sacramento MD  Sonographer Comments: Suboptimal apical window. IMPRESSIONS  1. Left ventricular ejection fraction, by estimation, is 30 to 35%. The left ventricle has moderately decreased function. The left ventricle demonstrates regional wall motion abnormalities (see scoring diagram/findings for description). There is mild left ventricular hypertrophy. Left ventricular diastolic parameters are consistent with Grade II diastolic  dysfunction (pseudonormalization). There is severe hypokinesis of the left ventricular, mid-apical anteroseptal wall, anterior wall, anterior segment and apical segment.  2. Right ventricular systolic function is normal. The right ventricular size is normal. There is mildly elevated pulmonary artery systolic pressure.  3. Left atrial size was mildly dilated.  4. The mitral valve is abnormal. Moderate mitral valve regurgitation. No evidence of mitral stenosis. Severe mitral annular calcification.  5. The aortic valve is calcified. Aortic valve regurgitation is not visualized. Severe aortic valve stenosis. Aortic valve area, by VTI measures 0.64 cm. Aortic valve mean gradient measures 34.2 mmHg. FINDINGS  Left Ventricle: Left ventricular ejection fraction, by estimation, is 30 to 35%. The left ventricle has moderately decreased function. The left ventricle demonstrates regional wall motion abnormalities. Severe hypokinesis of the left ventricular, mid-apical anteroseptal wall, anterior wall, anterior segment and apical segment. The left ventricular internal cavity size was normal in size. There is mild left ventricular hypertrophy. Left ventricular diastolic parameters are consistent with Grade II  diastolic dysfunction (pseudonormalization). Right Ventricle: The right ventricular size is normal. No increase in right ventricular wall thickness. Right ventricular systolic function is normal. There is mildly elevated pulmonary artery systolic pressure. The tricuspid regurgitant velocity is 3.14  m/s, and with an assumed right atrial pressure of 5 mmHg, the estimated right ventricular systolic pressure is [id number] mmHg. Left Atrium: Left atrial size was mildly dilated. Right Atrium: Right atrial size was normal in size. Pericardium: There is no evidence of pericardial effusion. Mitral Valve: The mitral valve is abnormal. There is moderate thickening of the mitral valve leaflet(s). There is moderate calcification of the  mitral valve leaflet(s). Severe mitral annular calcification. Moderate mitral valve regurgitation. No evidence  of mitral valve stenosis. MV peak gradient, 8.5 mmHg. The mean mitral valve gradient is 3.0 mmHg. Tricuspid Valve: The tricuspid valve is normal in structure. Tricuspid valve regurgitation is mild . No evidence of tricuspid stenosis. Aortic Valve: The aortic valve is calcified. Aortic valve regurgitation is not visualized. Severe aortic stenosis is present. Aortic valve mean gradient measures 34.2 mmHg. Aortic valve peak gradient measures 51.2 mmHg. Aortic valve area, by VTI measures  0.64 cm. Pulmonic Valve: The pulmonic valve was normal in structure. Pulmonic valve regurgitation is not visualized. No evidence of pulmonic stenosis. Aorta: The aortic root is normal in size and structure. Venous: The inferior vena cava was not well visualized. IAS/Shunts: No atrial level shunt detected by color flow Doppler.  LEFT VENTRICLE PLAX 2D LVIDd:         5.20 cm   Diastology LVIDs:         3.80 cm   LV e' medial:    4.24 cm/s LV PW:         1.00 cm   LV E/e' medial:  26.9 LV IVS:        0.95 cm   LV e' lateral:   3.81 cm/s LVOT diam:     2.00 cm   LV E/e' lateral: 29.9 LV SV:         56 LV SV Index:   35 LVOT Area:     3.14 cm  RIGHT VENTRICLE RV Basal diam:  4.35 cm RV S prime:     14.30 cm/s TAPSE (M-mode): 3.0 cm LEFT ATRIUM             Index        RIGHT ATRIUM           Index LA diam:  4.70 cm 2.99 cm/m   RA Area:     14.30 cm LA Vol (A2C):   75.6 ml 48.07 ml/m  RA Volume:   35.80 ml  22.76 ml/m LA Vol (A4C):   50.4 ml 32.05 ml/m LA Biplane Vol: 67.4 ml 42.85 ml/m  AORTIC VALVE                     PULMONIC VALVE AV Area (Vmax):    0.57 cm      PV Vmax:        0.92 m/s AV Area (Vmean):   0.53 cm      PV Vmean:       61.800 cm/s AV Area (VTI):     0.64 cm      PV VTI:         0.171 m AV Vmax:           357.75 cm/s   PV Peak grad:   3.4 mmHg AV Vmean:          281.000 cm/s  PV Mean grad:   2.0  mmHg AV VTI:            0.866 m       RVOT Peak grad: 4 mmHg AV Peak Grad:      51.2 mmHg AV Mean Grad:      34.2 mmHg LVOT Vmax:         64.60 cm/s LVOT Vmean:        47.800 cm/s LVOT VTI:          0.177 m LVOT/AV VTI ratio: 0.20  AORTA Ao Root diam: 3.00 cm MITRAL VALVE                TRICUSPID VALVE MV Area (PHT): 3.45 cm     TR Peak grad:   39.4 mmHg MV Area VTI:   1.36 cm     TR Vmax:        314.00 cm/s MV Peak grad:  8.5 mmHg MV Mean grad:  3.0 mmHg     SHUNTS MV Vmax:       1.46 m/s     Systemic VTI:  0.18 m MV Vmean:      78.6 cm/s    Systemic Diam: 2.00 cm MV Decel Time: 220 msec     Pulmonic VTI:  0.192 m MV E velocity: 114.00 cm/s MV A velocity: 96.40 cm/s MV E/A ratio:  1.18 Kathlyn Sacramento MD Electronically signed by Kathlyn Sacramento MD Signature Date/Time: 07/23/2021/2:01:59 PM    Final     Cardiac Studies   Echo  1. Left ventricular ejection fraction, by estimation, is 30 to 35%. The  left ventricle has moderately decreased function. The left ventricle  demonstrates regional wall motion abnormalities (see scoring  diagram/findings for description). There is mild  left ventricular hypertrophy. Left ventricular diastolic parameters are  consistent with Grade II diastolic dysfunction (pseudonormalization).  There is severe hypokinesis of the left ventricular, mid-apical  anteroseptal wall, anterior wall, anterior  segment and apical segment.   2. Right ventricular systolic function is normal. The right ventricular  size is normal. There is mildly elevated pulmonary artery systolic  pressure.   3. Left atrial size was mildly dilated.   4. The mitral valve is abnormal. Moderate mitral valve regurgitation. No  evidence of mitral stenosis. Severe mitral annular calcification.   5. The aortic valve is calcified. Aortic valve regurgitation is not  visualized. Severe aortic valve stenosis. Aortic  valve area, by VTI  measures 0.64 cm. Aortic valve mean gradient measures 34.2 mmHg.    Patient Profile     76 y/o ? w/ a h/o PAD s/p multiple lower extremity PTAs/stents, mesenteric ischemia/ischemic colitis, HL, DMII, tob abuse, COPD, and HL, who was admitted to [facility] on 07/21/2021 w/ dyspnea, back pain, and NSTEMI, and has been found to have LV dysfunction in the setting of severe AS, and critical LM disease.  Assessment & Plan    1.  NSTEMI/CAD:   peak HsTrop of 4188  Cardiac catheterization yesterday with critical left main disease, severe aortic valve stenosis, -On heparin infusion, symptom-free this morning Scheduled for transfer to [facility]\ Challenging case in the setting of severe coronary disease, valvular heart disease, underlying COPD, PAD Will need involvement from advanced CHF team, TCTS, and structural heart team.   -Cont asa, statin, ? blocker.    2.  Acute systolic CHF/ICM:   EF [id number] by echo  RHC w/ elevated filling pressures (PA 51/21(32),PCWP mean of 22).   Cont ? blocker and transition lisinopril to losartan  Unable to titrate upwards given hypotension -- We will give dose Lasix 20 IV x1 this morning  3.  Severe Aortic Stenosis:   on echo/cath w/ AoV mean grad of 45mmHg w/ AVA of 0.44. EF 25-35%.  Will need evaluation from structural heart team/TCTS.   4.  Moderate MR:   Large V waves on RHC.  Mod MR on echo.   5.  HL:   cont statin    6.  Tob Abuse/COPD:   Long smoking history, now on nicotine patch Chronic bronchitis  7.  PAD:   S/p multiple lower ext PTAs/stents.   Cont asa/statin rx.  Followed by vasc surgery as outpt.  No recent claudication. Smoking cessation recommended   8  Mesenteric ischemia:   s/p prior SMA stenting.  Asymptomatic.   9.  Normocytic anemia:   stable.  Hemoglobin 9   11.  DMII:  cont SSI.  Consider SGLT2i in post-op setting.   12.  Hyponatremia:   follow w/ diuresis. Sodium 127   For questions or updates, please contact [place] Please consult [email or web address] for contact info under         Signed, Ida Rogue, MD  07/02/2021, 9:09 AM

## 2021-07-24 NOTE — Progress Notes (Signed)
Patient was a rapid response requiring Bipap 100%.  Given steroids and labs drawn.  Dr. Mal Misty notified of patient's arrival.

## 2021-07-24 NOTE — Progress Notes (Signed)
ANTICOAGULATION CONSULT NOTE  Pharmacy Consult for IV Heparin  Indication: chest pain/ACS  Patient Measurements: Height: 5\' 1"  (154.9 cm) Weight: 59 kg (130 lb) IBW/kg (Calculated) : 47.8 Heparin Dosing Weight: 59 kg   Labs: Recent Labs    07/21/21 2138 07/21/21 2329 07/22/21 0439 07/22/21 0729 07/22/21 1750 07/23/21 0430 07/03/2021 0058  HGB 10.0*  --  9.7*  --   --  9.2*  --   HCT 29.9*  --  28.1*  --   --  27.5*  --   PLT 204  --  194  --   --  181  --   APTT  --  38*  --   --   --   --   --   LABPROT  --  14.3 14.2  --   --   --   --   INR  --  1.1 1.1  --   --   --   --   HEPARINUNFRC  --   --   --    < > <0.10* 0.15* 0.24*  CREATININE 0.62  --  0.56  --   --  0.68  --   TROPONINIHS 3,727* 4,188*  --   --   --   --   --    < > = values in this interval not displayed.     Estimated Creatinine Clearance: 50.9 mL/min (by C-G formula based on SCr of 0.68 mg/dL).   Medical History: Past Medical History:  Diagnosis Date   Allergy    seasonal allergies   Anemia 2014   needed 5 units of blood d/t passing out, weak   Anxiety    Arthritis    Asthma    allergy induced asthma   CAD (coronary artery disease)    a. 05/2018 MV: EF 82%, no ischemia/infarct; b. 07/2021 NSTEMI/Cath: LM 36m, LAD 70p/m, LCX min irregs, RCA 20ost/p. AoV mean grad of 56mmHg w/ AVA of 0.44. EF 25-35%.   Cardiac murmur 12/12/2017   Cataract    left   Complication of anesthesia    arrhythmia following colonoscopy   Degenerative disc disease, lumbar    Depression    Diabetes mellitus    Diabetic neuropathy ([place]) 11/16/2015   GERD (gastroesophageal reflux disease)    H/O transfusion    patient was given 5 units of blood while at [place], blood type O+   HFrEF (heart failure with reduced ejection fraction) ([place])    a. 05/2018 Echo: EF 60-65%; b. 07/2021 Echo: EF 30-35%.   History of chicken pox    History of hiatal hernia    History of measles, mumps, or rubella    HOH (hard of hearing)     does not use hearing aides yet   Hyperlipidemia    Hypertension    Irregular heartbeat    Ischemic cardiomyopathy    a. 05/2018 Echo: EF 60-65%; b. 07/2021 Echo: EF 30-35%. GrII DD, sev mid-apical/anteroseptal/ant/apical HK.   Ischemic colitis ([place])    LVH (left ventricular hypertrophy) 12/24/2017   Echo Aug 2018   Mesenteric ischemia ([place])    a. 12/2019 s/p PTA/stenting of SMA w/ 6x29 balloon expandable VBX stent.   Moderate mitral regurgitation    Neuropathy    NSTEMI (non-ST elevated myocardial infarction) ([place]) 07/21/2021   Opiate use 11/16/2015   Peripheral arterial disease ([place])    a. 05/2018 s/p PTA/DCBA of L CFA; b. 11/2018 PTAR TP trunk and prox peroneal, DCBA R SFA & prox  pop, Viabahn stenting to mid/distal R SFA; c. 05/2019 DCBA/stenting L SFA.   Pes planus of both feet 11/16/2015   Plantar fasciitis of right foot 11/16/2015   Pneumonia    Severe aortic stenosis    a. 05/2018 Echo: EF 60-65%, GrI DD, mild AS (mean grad 55mmHg, AVA 1.31cm^2); b. 07/2021 Echo: Ef 30-35%, mild LVH, GrII DD, sev mid-apical/anteroseptal/ant/apical HK. Nl RV fxn. Mildly dil LA. Mod MR. Sev AS (mean grad 34.29mmHg, AVA 0.64 cm^2).   Wheezing     Assessment: Pharmacy consulted to dose heparin in this 76 year old female admitted with ACS/NSTEMI.  No prior anticoag noted.   Patient had cardiac catheterization 1/23 which showed critical left main stenosis. Plan for [facility] for CABG evaluation plus AVR versus left main stenting plus TAVR  Date Time HL Rate/comment 1/22 0729 <0.10 600 un/hr 1/22 1750 <0.10 900 un/hr 1/23     0430      0.15  1150 units/hr 1/24 0058   0.24 subtherapeutic  X 1 Goal of Therapy:  Heparin level 0.3-0.7 units/ml Monitor platelets by anticoagulation protocol: Yes   Plan:  --Bolus 900 units x 1 --increase heparin rate to 1450 units/hr  --Heparin level 8 hours after rate change --Daily CBC per protocol while on IV heparin  Renda Rolls, PharmD,  [facility] 07/18/2021 2:11 AM

## 2021-07-24 NOTE — Consult Note (Addendum)
Advanced Heart Failure Team Consult Note   Primary Physician: Marguerita Merles, MD PCP-Cardiologist:  Ida Rogue, MD  Reason for Consultation: Acute Systolic Heart Failure; HF optimization prior to revascularization and AVR.   HPI:    Andrea Holden is seen today for evaluation of acute systolic heart failure and HF optimization prior to revascularization and AVR, at the request of Dr. Fletcher Anon, Cardiology.    76 y/o female w/ h/o tobacco abuse, extensive PAD s/p multiple lower arterial interventions, T2DM, HLD, COPD and AS (mild on echo 2019) admitted w/ SOB and back pain and ruled in for NSTEMI. Hs trop 3,727>>4,188>>4,188. Echo w/ moderate to severe LV dysfunction, EF 30-35% (previously 60-65%), RV normal + severe aortic stenosis with mean gradient of 34 mmHg and valve area of 0.44.   Underwent R/LHC showing critical left main stenosis, 95%, w/  significant calcifications. Prox-Mid LAD 70% stenosed.  No obstructive disease involving the right coronary artery. In addition to severe AS, also w/ moderate to severe mitral regurgitation (Prominent V waves on pulmonary wedge tracing suggestive of significant mitral regurgitation).   RHC w/ elevated filling pressures and low output. RAP 13, mPCWP 22, CO 3.11, CI 1.97L/min/m2.   Transferred to [facility] for further management/ optimization of HF and consultation for potential CABG + AVR vs PCI w/ TAVR.   She arrived overnight. This morning she is w/ 8/10 chest pain. SBPs soft low 90s. O2 sats dropped to 88% on 2L. RN just increased to 3L, O2 sats up to 93%.    Today's labs: SCr 0.91, BUN 33, Na 128  K 4.3. Hgb trending down, 8.7 today, WBC 10.7. Afebrile. She got dose of IV solumedrol for suspected ACOPDE at [facility].         Echo 07/23/21 Left ventricular ejection fraction, by estimation, is 30 to 35%. The left ventricle has moderately decreased function. The left ventricle demonstrates regional wall motion abnormalities (see scoring  diagram/findings for description). There is mild left ventricular hypertrophy. Left ventricular diastolic parameters are consistent with Grade II diastolic dysfunction (pseudonormalization). There is severe hypokinesis of the left ventricular, mid-apical anteroseptal wall, anterior wall, anterior segment and apical segment. 1. Right ventricular systolic function is normal. The right ventricular size is normal. There is mildly elevated pulmonary artery systolic pressure. 2. 3. Left atrial size was mildly dilated. The mitral valve is abnormal. Moderate mitral valve regurgitation. No evidence of mitral stenosis. Severe mitral annular calcification. 4. The aortic valve is calcified. Aortic valve regurgitation is not visualized. Severe aortic valve stenosis. Aortic valve area, by VTI measures 0.64 cm. Aortic valve mean gradient measures 34.2 mmHg.       R/LHC 07/23/21     Mid LM lesion is 95% stenosed.   Prox LAD to Mid LAD lesion is 70% stenosed.   Ost RCA to Prox RCA lesion is 20% stenosed.   There is moderate to severe left ventricular systolic dysfunction.   LV end diastolic pressure is moderately elevated.   The left ventricular ejection fraction is 25-35% by visual estimate.   1.  Critical left main stenosis with significant calcifications.  No obstructive disease involving the right coronary artery. 2.  Moderately to severely reduced LV systolic function with an EF of 30 to 35% with global hypokinesis.  Moderate to severe mitral regurgitation. 3.  Severe aortic stenosis with mean gradient of 34 mmHg and valve area of 0.44. 4.  Right heart catheterization showed moderately elevated wedge pressure, moderate pulmonary hypertension and severely reduced  cardiac output.  Prominent V waves on pulmonary wedge tracing suggestive of significant mitral regurgitation.    Diagnostic Dominance: Right   Review of Systems: [y] = yes, _0  = no   General: Weight gain _1 ; Weight loss _2 ;  Anorexia _3 ; Fatigue [ Y]; Fever _4 ; Chills _5 ; Weakness _6   Cardiac: Chest pain/pressure [ Y]; Resting SOB [Y ]; Exertional SOB [Y ]; Orthopnea _7 ; Pedal Edema _8 ; Palpitations _9 ; Syncope _10 ; Presyncope _11 ; Paroxysmal nocturnal dyspnea_12   Pulmonary: Cough _13 ; Wheezing_14 ; Hemoptysis_15 ; Sputum _16 ; Snoring _17   GI: Vomiting_18 ; Dysphagia_19 ; Melena_20 ; Hematochezia _21 ; Heartburn_22 ; Abdominal pain _23 ; Constipation _24 ; Diarrhea _25 ; BRBPR _26   GU: Hematuria_27 ; Dysuria _28 ; Nocturia_29   Vascular: Pain in legs with walking _30 ; Pain in feet with lying flat _31 ; Non-healing sores _32 ; Stroke _33 ; TIA _34 ; Slurred speech _35 ;  Neuro: Headaches_36 ; Vertigo_37 ; Seizures_38 ; Paresthesias_39 ;Blurred vision _40 ; Diplopia _41 ; Vision changes _42   Ortho/Skin: Arthritis _43 ; Joint pain _44 ; Muscle pain _45 ; Joint swelling _46 ; Back Pain _47 ; Rash _48   Psych: Depression_49 ; Anxiety_50   Heme: Bleeding problems _51 ; Clotting disorders _52 ; Anemia _53   Endocrine: Diabetes [ Y]; Thyroid dysfunction_54   Home Medications Prior to Admission medications   Medication Sig Start Date End Date Taking? Authorizing Provider  acetaminophen (TYLENOL) 325 MG tablet Take 2 tablets (650 mg total) by mouth every 6 (six) hours as needed for mild pain, moderate pain, headache or fever. 12/31/19   Vashti Hey, MD  albuterol (VENTOLIN HFA) 108 (90 Base) MCG/ACT inhaler INHALE 2 PUFFS INTO THE LUNGS EVERY 6 HOURS AS NEEDED FOR WHEEZING OR SHORTNESS OF BREATH 04/04/20   [name], PA-C  amLODipine (NORVASC) 5 MG tablet Take 5 mg by mouth daily. 04/16/21   [provider]  aspirin EC 81 MG EC tablet Take 1 tablet (81 mg total) by mouth daily. Swallow whole. 07/29/2021   Jennye Boroughs, MD  atorvastatin (LIPITOR) 10 MG tablet Take 1 tablet (10 mg total) by mouth at bedtime. 08/24/19   [name], PA-C  atorvastatin (LIPITOR) 40 MG tablet Take 1 tablet (40 mg total) by mouth daily at 6 PM. 07/23/21    Jennye Boroughs, MD  Cholecalciferol (VITAMIN D3) 250 MCG (10000 UT) TABS Take 10,000 Units by mouth daily.     [provider]  clopidogrel (PLAVIX) 75 MG tablet TAKE 1 TABLET BY MOUTH ONCE A DAY 01/08/21   Minna Merritts, MD  fluticasone (FLONASE) 50 MCG/ACT nasal spray Place 2 sprays into both nostrils daily as needed. 08/24/19   [name], PA-C  fluticasone-salmeterol (ADVAIR) 250-50 MCG/ACT AEPB Inhale 1 puff into the lungs 2 (two) times daily. 07/02/21   [provider]  gabapentin (NEURONTIN) 400 MG capsule Take 1 capsule (400 mg total) by mouth 2 (two) times daily. 05/31/20   [name], PA-C  ipratropium-albuterol (DUONEB) 0.5-2.5 (3) MG/3ML SOLN Take 3 mLs by nebulization 3 (three) times daily as needed. 09/06/20 10/16/20  Menshew, Dannielle Karvonen, PA-C  metoprolol succinate (TOPROL-XL) 25 MG 24 hr tablet Take 0.5 tablets (12.5 mg total) by mouth daily. 07/31/2021   Jennye Boroughs, MD  montelukast (SINGULAIR) 10 MG tablet Take 1 tablet (10 mg total) by mouth at bedtime. 08/24/19  [name], PA-C  Multiple Vitamins-Minerals (MULTIVITAMIN ADULTS 50+) TABS Take 1 tablet by mouth daily. 11/02/18   Poulose, Bethel Born, NP  nicotine (NICODERM CQ - DOSED IN MG/24 HOURS) 14 mg/24hr patch Place 1 patch (14 mg total) onto the skin daily. 07/23/2021   Jennye Boroughs, MD  pantoprazole (PROTONIX) 20 MG tablet Take 1 tablet (20 mg total) by mouth daily. 05/31/20   [name], PA-C  Respiratory Therapy Supplies (NEBULIZER/TUBING/MOUTHPIECE) KIT Disp one nebulizer machine, tubing set and mouthpiece kit Patient not taking: Reported on 05/31/2020 05/24/19   [name], PA-C  sertraline (ZOLOFT) 100 MG tablet TAKE 1 AND 1/2 TABLETS (150 MG TOTAL) BY MOUTH DAILY Patient taking differently: Take 100 mg by mouth daily. 08/24/19   [name], PA-C  vitamin C (ASCORBIC ACID) 500 MG tablet Take 1,000 mg by mouth every other day.     [provider]  zinc gluconate 50 MG tablet Take 50 mg by  mouth daily.    [provider]    Past Medical History: Past Medical History:  Diagnosis Date   Acute on chronic diastolic CHF (congestive heart failure) ([place]) 07/14/2021   Allergy    seasonal allergies   Anemia 2014   needed 5 units of blood d/t passing out, weak   Anxiety    Arthritis    Asthma    allergy induced asthma   CAD (coronary artery disease)    a. 05/2018 MV: EF 82%, no ischemia/infarct; b. 07/2021 NSTEMI/Cath: LM 77m LAD 70p/m, LCX min irregs, RCA 20ost/p. AoV mean grad of 375mg w/ AVA of 0.44. EF 25-35%.   Cardiac murmur 12/12/2017   Cataract    left   Complication of anesthesia    arrhythmia following colonoscopy   Degenerative disc disease, lumbar    Depression    Diabetes mellitus    Diabetic neuropathy ([place]/18/2017   GERD (gastroesophageal reflux disease)    H/O transfusion    [place]   HFrEF (heart failure with reduced ejection fraction) ([place]   a. 05/2018 Echo: EF 60-65%; b. 07/2021 Echo: EF 30-35%.   History of chicken pox    History of hiatal hernia    History of measles, mumps, or rubella    HOH (hard of hearing)    does not use hearing aides yet   Hyperlipidemia    Hypertension    Irregular heartbeat    Ischemic cardiomyopathy    a. 05/2018 Echo: EF 60-65%; b. 07/2021 Echo: EF 30-35%. GrII DD, sev mid-apical/anteroseptal/ant/apical HK.   Ischemic colitis ([place]   LVH (left ventricular hypertrophy) 12/24/2017   Echo Aug 2018   Mesenteric ischemia ([facility]   a. 12/2019 s/p PTA/stenting of SMA w/ 6x29 balloon expandable VBX stent.   Moderate mitral regurgitation    Neuropathy    NSTEMI (non-ST elevated myocardial infarction) ([facility]CDavis01/21/2023   Opiate use 11/16/2015   Peripheral arterial disease ([place]   a. 05/2018 s/p PTA/DCBA of L CFA; b. 11/2018 PTAR TP trunk and prox peroneal, DCBA R SFA & prox pop, Viabahn stenting to mid/distal R SFA; c. 05/2019 DCBA/stenting L SFA.   Pes  planus of both feet 11/16/2015   Plantar fasciitis of right foot 11/16/2015   Pneumonia    Severe aortic stenosis    a. 05/2018 Echo: EF 60-65%, GrI DD, mild AS (mean grad 1794m, AVA 1.31cm^2); b. 07/2021 Echo: Ef 30-35%, mild LVH, GrII DD,  sev mid-apical/anteroseptal/ant/apical HK. Nl RV fxn. Mildly dil LA. Mod MR. Sev AS (mean grad 34.58mHg, AVA 0.64 cm^2).   Wheezing     Past Surgical History: Past Surgical History:  Procedure Laterality Date   ABDOMINAL HYSTERECTOMY     APPLICATION OF WOUND VAC Left 07/23/2018   Procedure: APPLICATION OF WOUND VAC;  Surgeon: DAlgernon Huxley MD;  Location: [facility] ORS;  Service: Vascular;  Laterality: Left;   banck injections     CATARACT EXTRACTION W/PHACO Right 05/30/2015   Procedure: CATARACT EXTRACTION PHACO AND INTRAOCULAR LENS PLACEMENT ([place];  Surgeon: WBirder Robson MD;  Location: [facility] ORS;  Service: Ophthalmology;  Laterality: Right;  [place]:57 AP% 20.9 CDE 11.99 fluid pack lot #[id number]   CATARACT EXTRACTION W/PHACO Left 11/11/2019   Procedure: CATARACT EXTRACTION PHACO AND INTRAOCULAR LENS PLACEMENT ([place] LEFT DIABETIC;  Surgeon: PBirder Robson MD;  Location: [facility] ORS;  Service: Ophthalmology;  Laterality: Left;  Lot ##[id number] [place] 00:47.5 CDE: 6.24   CHOLECYSTECTOMY  1970   COLON SURGERY  2013   blocked colon   COLONOSCOPY N/A 12/29/2019   Procedure: COLONOSCOPY;  Surgeon: TVirgel Manifold MD;  Location: [facility] ENDOSCOPY;  Service: Endoscopy;  Laterality: N/A;   COLONOSCOPY WITH PROPOFOL N/A 01/20/2018   Procedure: COLONOSCOPY WITH PROPOFOL;  Surgeon: WLucilla Lame MD;  [facility];  Service: Endoscopy;  Laterality: N/A;   ENDARTERECTOMY FEMORAL Left 07/08/2018   Procedure: ENDARTERECTOMY FEMORAL;  Surgeon: DAlgernon Huxley MD;  Location: [facility] ORS;  Service: Vascular;  Laterality: Left;   EYE SURGERY Right    cataract surgery   FOOT FUSION Right 2018   metal in foot   [place]   lost  178 lbs and regained 40 lbs last few years   HUMERUS FRACTURE SURGERY Right    metal plate with screws   internal bleeding  2016   ulcer in past   LOWER EXTREMITY ANGIOGRAM Left 07/08/2018   Procedure: LOWER EXTREMITY ANGIOGRAM;  Surgeon: DAlgernon Huxley MD;  Location: [facility] ORS;  Service: Vascular;  Laterality: Left;   LOWER EXTREMITY ANGIOGRAPHY Left 05/27/2018   Procedure: LOWER EXTREMITY ANGIOGRAPHY;  Surgeon: DAlgernon Huxley MD;  [facility];  Service: Cardiovascular;  Laterality: Left;   LOWER EXTREMITY ANGIOGRAPHY Right 12/07/2018   Procedure: LOWER EXTREMITY ANGIOGRAPHY;  Surgeon: DAlgernon Huxley MD;  [place] [facility];  Service: Cardiovascular;  Laterality: Right;   LOWER EXTREMITY ANGIOGRAPHY Left 05/10/2019   Procedure: LOWER EXTREMITY ANGIOGRAPHY;  Surgeon: DAlgernon Huxley MD;  [facility];  Service: Cardiovascular;  Laterality: Left;   MOUTH SURGERY     root canals and crowns and extractions   MOUTH SURGERY  09/30/2019   OVARY SURGERY     RIGHT/LEFT HEART CATH AND CORONARY ANGIOGRAPHY N/A 07/23/2021   Procedure: RIGHT/LEFT HEART CATH AND CORONARY ANGIOGRAPHY;  Surgeon: AWellington Hampshire MD;  [facility];  Service: Cardiovascular;  Laterality: N/A;   SKIN GRAFT Right 2018   RT foot. foot has been rebuilt.  it is full of metal   TENOTOMY ACHILLES TENDON Right    Percuntaneous. metal in foot   TONSILLECTOMY     VISCERAL ANGIOGRAPHY N/A 12/31/2019   Procedure: VISCERAL ANGIOGRAPHY;  Surgeon: DAlgernon Huxley MD;  [facility];  Service: Cardiovascular;  Laterality: N/A;   WOUND DEBRIDEMENT Left 07/23/2018   Procedure: DEBRIDEMENT WOUND;  Surgeon: DAlgernon Huxley MD;  Location: [facility];  Service: Vascular;  Laterality: Left;    Family History: Family History  Problem Relation Age of Onset   Asthma Mother    COPD Mother    Arthritis Father    Depression Father    Heart disease Father    Hypertension Father     Stroke Father    Heart attack Father    Arthritis Brother    Depression Brother    Diabetes Brother    Heart disease Brother    Hyperlipidemia Brother    Hypertension Brother    Stroke Brother    Vision loss Brother    Heart attack Brother    Diabetes Maternal Grandmother    Thyroid disease Daughter    Colitis Daughter    Breast cancer Neg Hx     Social History: Social History   Socioeconomic History   Marital status: Divorced    Spouse name: Not on file   Number of children: 1   Years of education: Not on file   Highest education level: Associate degree: academic program  Occupational History   Occupation: Oceanographer    Comment: [employer]  Tobacco Use   Smoking status: Some Days    Packs/day: 0.50    Years: 55.00    Pack years: 27.50    Types: Cigarettes    Start date: 07/01/1960    Last attempt to quit: 11/22/2018    Years since quitting: 2.6   Smokeless tobacco: Never   Tobacco comments:    trying to quit again; wants to use chantix but unable to afford  Vaping Use   Vaping Use: Never used  Substance and Sexual Activity   Alcohol use: Yes    Alcohol/week: 0.0 standard drinks    Comment: 2 drinks per month   Drug use: Not Currently    Types: Marijuana    Comment: used for pain   Sexual activity: Not Currently  Other Topics Concern   Not on file  Social History Narrative   Not on file   Social Determinants of Health   Financial Resource Strain: Not on file  Food Insecurity: Not on file  Transportation Needs: Not on file  Physical Activity: Not on file  Stress: Not on file  Social Connections: Not on file    Allergies:  Allergies  Allergen Reactions   Meloxicam Other (See Comments)    GI bleeding   Nsaids Other (See Comments)    Gi bleeding   Sitagliptin Hives and Itching    Januvia     Objective:    Vital Signs:   Temp:  [96.4 F (35.8 C)-98.3 F (36.8 C)] 97.8 F (36.6 C) (01/25 0736) Pulse  Rate:  [45-92] 82 (01/25 1126) Resp:  [13-22] 18 (01/25 0736) BP: (84-152)/(47-107) 84/51 (01/25 1126) SpO2:  [84 %-100 %] 92 % (01/25 1126) FiO2 (%):  [50 %-100 %] 50 % (01/24 1350) Weight:  [61.7 kg] 61.7 kg (01/25 0300) Last BM Date: 07/20/2021  Weight change: Filed Weights   07/22/2021 0300  Weight: 61.7 kg    Intake/Output:   Intake/Output Summary (Last 24 hours) at 07/31/2021 1159 Last data filed at 07/03/2021 0313 Gross per 24 hour  Intake 240 ml  Output 300 ml  Net -60 ml      Physical Exam    General:  thin/ fatigued appearing elderly WF. Slight increased WOB, improved to up titration of SL [place]  HEENT: normal Neck: supple. JVP 10 cm Carotids 2+  bilat; no bruits. No lymphadenopathy or thyromegaly appreciated. Cor: PMI nondisplaced. Regular rate & rhythm. 3/6 AS murmur No rubs, gallops  Lungs: decreased BS at the bases  Abdomen: soft, nontender, nondistended. No hepatosplenomegaly. No bruits or masses. Good bowel sounds. Extremities: no cyanosis, clubbing, rash, edema Neuro: alert & orientedx3, cranial nerves grossly intact. moves all 4 extremities w/o difficulty. Affect pleasant   Telemetry   NSR 80s, personally reviewed   EKG    NSR w/ PACs, 89 bpm lateral ST depressions, personally reviewed   Labs   Basic Metabolic Panel: Recent Labs  Lab 07/21/21 2138 07/22/21 0439 07/23/21 0430 07/15/2021 1228 07/21/2021 0505  NA 126* 126* 127* 125* 128*  K 3.8 3.9 3.9 4.0 4.3  CL 92* 95* 94* 94* 95*  CO2 _0 GLUCOSE 159* 128* 111* 248* 123*  BUN 17 20 24* 33* 33*  CREATININE 0.62 0.56 0.68 0.94 0.91  CALCIUM 9.0 9.0 8.6* 8.6* 8.7*  MG  --   --  1.5*  --  1.7    Liver Function Tests: No results for input(s): AST, ALT, ALKPHOS, BILITOT, PROT, ALBUMIN in the last 168 hours. No results for input(s): LIPASE, AMYLASE in the last 168 hours. No results for input(s): AMMONIA in the last 168 hours.  CBC: Recent Labs  Lab 07/21/21 2138 07/22/21 0439  07/23/21 0430 07/06/2021 1228 07/23/2021 0505  WBC 12.0* 10.2 9.3 16.3* 10.7*  HGB 10.0* 9.7* 9.2* 10.8* 8.7*  HCT 29.9* 28.1* 27.5* 32.8* 26.2*  MCV 79.7* 79.8* 79.0* 82.0 81.1  PLT 204 194 181 285 187    Cardiac Enzymes: No results for input(s): CKTOTAL, CKMB, CKMBINDEX, TROPONINI in the last 168 hours.  BNP: BNP (last 3 results) Recent Labs    07/21/21 2329  BNP 2,354.7*    ProBNP (last 3 results) No results for input(s): PROBNP in the last 8760 hours.   CBG: Recent Labs  Lab 07/21/2021 1222 07/02/2021 1611 07/14/2021 2221 07/14/2021 0627 07/04/2021 1135  GLUCAP 262* 213* 181* 137* 128*    Coagulation Studies: No results for input(s): LABPROT, INR in the last 72 hours.    Imaging   CT CORONARY MORPH W/CTA COR W/SCORE W/CA W/CM &/OR WO/CM  Result Date: 07/26/2021 EXAM: OVER-READ INTERPRETATION  CT CHEST The following report is an over-read performed by radiologist Dr. Vinnie Langton of [facility], [place] on 07/23/2021. This over-read does not include interpretation of cardiac or coronary anatomy or pathology. The coronary calcium score/coronary CTA interpretation by the cardiologist is attached. COMPARISON:  Chest CTA 02/08/2011. FINDINGS: Extracardiac findings will be described separately under dictation for contemporaneously obtained CTA chest abdomen and pelvis. IMPRESSION: Please see separate dictation for contemporaneously obtained CTA chest, abdomen and pelvis dated 07/09/2021 for full description of relevant extracardiac findings. Electronically Signed   By: Vinnie Langton M.D.   On: 07/28/2021 10:25   DG Chest Port 1 View  Result Date: 07/01/2021 CLINICAL DATA:  Respiratory distress EXAM: PORTABLE CHEST 1 VIEW COMPARISON:  Chest x-ray dated July 21, 2021 FINDINGS: Visualized cardiac and mediastinal contours are unchanged. New diffuse bilateral heterogeneous opacities. Small right-greater-than-left pleural effusions. IMPRESSION: 1. New diffuse bilateral  heterogeneous opacities, likely due to worsening pulmonary edema. 2. Small right-greater-than-left pleural effusions. Electronically Signed   By: Yetta Glassman M.D.   On: 07/03/2021 12:07   CT ANGIO CHEST AORTA W/CM & OR WO/CM  Result Date: 07/23/2021 CLINICAL DATA:  76 year old female with history of severe aortic stenosis. Preprocedural study prior to potential  transcatheter aortic valve replacement (TAVR) procedure. EXAM: CT ANGIOGRAPHY CHEST, ABDOMEN AND PELVIS TECHNIQUE: Non-contrast CT of the chest was initially obtained. Multidetector CT imaging through the chest, abdomen and pelvis was performed using the standard protocol during bolus administration of intravenous contrast. Multiplanar reconstructed images and MIPs were obtained and reviewed to evaluate the vascular anatomy. RADIATION DOSE REDUCTION: This exam was performed according to the departmental dose-optimization program which includes automated exposure control, adjustment of the mA and/or kV according to patient size and/or use of iterative reconstruction technique. CONTRAST:  45m OMNIPAQUE IOHEXOL 350 MG/ML SOLN COMPARISON:  CTA of the abdomen and pelvis 12/29/2019. Chest CTA 02/08/2011. FINDINGS: CTA CHEST FINDINGS Cardiovascular: Heart size is normal. There is no significant pericardial fluid, thickening or pericardial calcification. There is aortic atherosclerosis, as well as atherosclerosis of the great vessels of the mediastinum and the coronary arteries, including calcified atherosclerotic plaque in the left main, left anterior descending, left circumflex and right coronary arteries. Severe thickening and calcification of the aortic valve. Calcifications of the mitral annulus. Mediastinum/Lymph Nodes: No pathologically enlarged mediastinal or hilar lymph nodes. Esophagus is unremarkable in appearance. No axillary lymphadenopathy. Lungs/Pleura: Moderate bilateral pleural effusions lying dependently with areas of passive atelectasis  in the lower lobes of the lungs bilaterally. Widespread but patchy areas of ground-glass attenuation and interlobular septal thickening throughout the lungs, indicative of pulmonary edema. No confluent consolidative airspace disease. No definite suspicious appearing pulmonary nodules or masses are noted. Musculoskeletal/Soft Tissues: There are no aggressive appearing lytic or blastic lesions noted in the visualized portions of the skeleton. CTA ABDOMEN AND PELVIS FINDINGS Hepatobiliary: 7 mm low-attenuation lesion in segment 2 of the liver, too small to characterize, but statistically likely to represent a tiny cyst. No other suspicious appearing hepatic lesions. No intra or extrahepatic biliary ductal dilatation. Gallbladder is not visualized, presumably surgically absent. Pancreas: No pancreatic mass. No pancreatic ductal dilatation. No pancreatic or peripancreatic fluid collections or inflammatory changes. Spleen: Unremarkable. Adrenals/Urinary Tract: Bilateral kidneys and adrenal glands are normal in appearance. No hydroureteronephrosis. Urinary bladder is nearly completely decompressed, but otherwise unremarkable in appearance. Stomach/Bowel: The appearance of the stomach is normal. There is no pathologic dilatation of small bowel or colon. Numerous colonic diverticulae are noted, without surrounding inflammatory changes to suggest an acute diverticulitis at this time. Normal appendix. Vascular/Lymphatic: Aortic atherosclerosis, with vascular findings and measurements pertinent to potential TAVR procedure, as detailed below. No aneurysm or dissection noted in the abdominal or pelvic vasculature. No lymphadenopathy noted in the abdomen or pelvis. Reproductive: Status post hysterectomy. Ovaries are not confidently identified may be surgically absent or atrophic. Other: No significant volume of ascites.  No pneumoperitoneum. Musculoskeletal: There are no aggressive appearing lytic or blastic lesions noted in the  visualized portions of the skeleton. VASCULAR MEASUREMENTS PERTINENT TO TAVR: AORTA: Minimal Aortic Diameter-13 x 12 mm Severity of Aortic Calcification-severe RIGHT PELVIS: Right Common Iliac Artery - Minimal Diameter-8.0 x 4.1 mm Tortuosity-mild Calcification-severe Right External Iliac Artery - Minimal Diameter-4.2 x 5.1 mm Tortuosity-mild-to-moderate Calcification-moderate to severe Right Common Femoral Artery - Minimal Diameter-6.7 x 5.9 mm Tortuosity-mild Calcification-moderate LEFT PELVIS: Left Common Iliac Artery - Minimal Diameter-4.2 x 6.2 mm Tortuosity-mild Calcification-severe Left External Iliac Artery - Minimal Diameter-5.6 x 4.9 mm Tortuosity-mild-to-moderate Calcification-moderate to severe Left Common Femoral Artery - Minimal Diameter-5.2 x 4.7 mm Tortuosity-mild Calcification-mild Review of the MIP images confirms the above findings. IMPRESSION: 1. Vascular findings and measurements pertinent to potential TAVR procedure, as detailed above. 2. Severe thickening calcification of  the aortic valve, compatible with reported clinical history of severe aortic stenosis. 3. Aortic atherosclerosis, in addition to left main and three-vessel coronary artery disease. 4. The appearance of the lungs suggests interstitial pulmonary edema. There also moderate bilateral pleural effusions and areas of passive atelectasis in the lower lobes of the lungs bilaterally. Given the normal left ventricular size, clinical correlation for signs and symptoms of diastolic heart failure is recommended. 5. Colonic diverticulosis without evidence of acute diverticulitis at this time. 6. Additional incidental findings, as above. Electronically Signed   By: Vinnie Langton M.D.   On: 07/19/2021 11:26   CT Angio Abd/Pel w/ and/or w/o  Result Date: 07/07/2021 CLINICAL DATA:  76 year old female with history of severe aortic stenosis. Preprocedural study prior to potential transcatheter aortic valve replacement (TAVR) procedure.  EXAM: CT ANGIOGRAPHY CHEST, ABDOMEN AND PELVIS TECHNIQUE: Non-contrast CT of the chest was initially obtained. Multidetector CT imaging through the chest, abdomen and pelvis was performed using the standard protocol during bolus administration of intravenous contrast. Multiplanar reconstructed images and MIPs were obtained and reviewed to evaluate the vascular anatomy. RADIATION DOSE REDUCTION: This exam was performed according to the departmental dose-optimization program which includes automated exposure control, adjustment of the mA and/or kV according to patient size and/or use of iterative reconstruction technique. CONTRAST:  67m OMNIPAQUE IOHEXOL 350 MG/ML SOLN COMPARISON:  CTA of the abdomen and pelvis 12/29/2019. Chest CTA 02/08/2011. FINDINGS: CTA CHEST FINDINGS Cardiovascular: Heart size is normal. There is no significant pericardial fluid, thickening or pericardial calcification. There is aortic atherosclerosis, as well as atherosclerosis of the great vessels of the mediastinum and the coronary arteries, including calcified atherosclerotic plaque in the left main, left anterior descending, left circumflex and right coronary arteries. Severe thickening and calcification of the aortic valve. Calcifications of the mitral annulus. Mediastinum/Lymph Nodes: No pathologically enlarged mediastinal or hilar lymph nodes. Esophagus is unremarkable in appearance. No axillary lymphadenopathy. Lungs/Pleura: Moderate bilateral pleural effusions lying dependently with areas of passive atelectasis in the lower lobes of the lungs bilaterally. Widespread but patchy areas of ground-glass attenuation and interlobular septal thickening throughout the lungs, indicative of pulmonary edema. No confluent consolidative airspace disease. No definite suspicious appearing pulmonary nodules or masses are noted. Musculoskeletal/Soft Tissues: There are no aggressive appearing lytic or blastic lesions noted in the visualized portions of  the skeleton. CTA ABDOMEN AND PELVIS FINDINGS Hepatobiliary: 7 mm low-attenuation lesion in segment 2 of the liver, too small to characterize, but statistically likely to represent a tiny cyst. No other suspicious appearing hepatic lesions. No intra or extrahepatic biliary ductal dilatation. Gallbladder is not visualized, presumably surgically absent. Pancreas: No pancreatic mass. No pancreatic ductal dilatation. No pancreatic or peripancreatic fluid collections or inflammatory changes. Spleen: Unremarkable. Adrenals/Urinary Tract: Bilateral kidneys and adrenal glands are normal in appearance. No hydroureteronephrosis. Urinary bladder is nearly completely decompressed, but otherwise unremarkable in appearance. Stomach/Bowel: The appearance of the stomach is normal. There is no pathologic dilatation of small bowel or colon. Numerous colonic diverticulae are noted, without surrounding inflammatory changes to suggest an acute diverticulitis at this time. Normal appendix. Vascular/Lymphatic: Aortic atherosclerosis, with vascular findings and measurements pertinent to potential TAVR procedure, as detailed below. No aneurysm or dissection noted in the abdominal or pelvic vasculature. No lymphadenopathy noted in the abdomen or pelvis. Reproductive: Status post hysterectomy. Ovaries are not confidently identified may be surgically absent or atrophic. Other: No significant volume of ascites.  No pneumoperitoneum. Musculoskeletal: There are no aggressive appearing lytic or blastic lesions noted  in the visualized portions of the skeleton. VASCULAR MEASUREMENTS PERTINENT TO TAVR: AORTA: Minimal Aortic Diameter-13 x 12 mm Severity of Aortic Calcification-severe RIGHT PELVIS: Right Common Iliac Artery - Minimal Diameter-8.0 x 4.1 mm Tortuosity-mild Calcification-severe Right External Iliac Artery - Minimal Diameter-4.2 x 5.1 mm Tortuosity-mild-to-moderate Calcification-moderate to severe Right Common Femoral Artery - Minimal  Diameter-6.7 x 5.9 mm Tortuosity-mild Calcification-moderate LEFT PELVIS: Left Common Iliac Artery - Minimal Diameter-4.2 x 6.2 mm Tortuosity-mild Calcification-severe Left External Iliac Artery - Minimal Diameter-5.6 x 4.9 mm Tortuosity-mild-to-moderate Calcification-moderate to severe Left Common Femoral Artery - Minimal Diameter-5.2 x 4.7 mm Tortuosity-mild Calcification-mild Review of the MIP images confirms the above findings. IMPRESSION: 1. Vascular findings and measurements pertinent to potential TAVR procedure, as detailed above. 2. Severe thickening calcification of the aortic valve, compatible with reported clinical history of severe aortic stenosis. 3. Aortic atherosclerosis, in addition to left main and three-vessel coronary artery disease. 4. The appearance of the lungs suggests interstitial pulmonary edema. There also moderate bilateral pleural effusions and areas of passive atelectasis in the lower lobes of the lungs bilaterally. Given the normal left ventricular size, clinical correlation for signs and symptoms of diastolic heart failure is recommended. 5. Colonic diverticulosis without evidence of acute diverticulitis at this time. 6. Additional incidental findings, as above. Electronically Signed   By: Vinnie Langton M.D.   On: 07/04/2021 11:26     Medications:     Current Medications:   Infusions:     Patient Profile   76 y/o female smoker w/ PAD, COPD, Type 2DM and HLD admitted w/ NSTEMI w/ critical LM disease on cath, new systolic heart failure w/ low output, severe aortic stenosis, mod-severe MR. Transferred from [facility] for further management/ optimization of HF and consultation for potential CABG + AVR vs PCI w/ TAVR.   Assessment/Plan   1. CAD - NSTEMI, HS trop Hs trop 3,727>>4,188>>4,188 - LHC w/ critical, 95%, LM stenosis + 70% p-mLAD disease - CT consult for potential CABG w/ AVR vs high risk PCI w/ TAVR (suspect she would be high risk surgical candidate given other  commodities and poor nutritional status) - Currently w/ 8/10 active chest pain, recheck 12 lead EKG, repeat Hs trop - Start low dose nitro gtt 65mg/min. Monitor BP  - Take to cath lab for urgent IABP placement to help w/ coronary perfusion  - Hgb 8.7.  May benefit from 1u RBC transfusion  - continue heparin gtt - ASA + high dose statin - Stop ? blocker (metoprolol) w/ low output     2. Severe Aortic Stenosis  - mean gradient 34 mmHg and valve area of 0.44. EF 30-35% - CT + structural heart team eval for SAVR vs TAVR  - Avoid hypotension, stop metoprolol w/ low output, caution w/ losartan (hold for now)     3. Acute Systolic Heart Failure>> Low Output - Echo 2019, EF normal 60-65%, RV normal - EF now 30-35%, RV normal - ICM + valvular HD. Obstructive CAD w/ critical LM + LAD disease. Severe AS  - RHC w/ elevated filling pressures and low output, CI 1.97L/min/m2. - MCS w/ Impella may be contraindicated w/ severe AS. ?BAV followed by Impella placement  - Plan IABP placement today for to help w/ coronary perfusion  - plan placement of swan + a-line for continuous hemodynamic monitoring - Hold IV diuretics for now w/ low BP    4. Hypomagnesemia - Mg 1.7, supp w/ MgSO4  5. Hyponatremia - Hypervolemic hyponatremia - Na 128 -  diurese and fluid restrict - Tolvaptan if < 125     6. COPD  - noted in history - no record of PFTs in chart  - Acute exacerbation expected, started on IV solumedrol at [facility]  - now on supp O2    7. Tobacco Abuse - Smoking cessation imperative  - nicotine patch    8. HLD, LDL Goal < 70   - lipids at [facility], LDL 49 mg/dL - continue atorvastatin 40   9. Type 2DM  - Hgb A1c 6.0  - SSI   Very tenuous. Cath lab for IABP placement. Transfer to ICU.   Length of Stay: 1  Lyda Jester, PA-C  07/20/2021, 11:59 AM  Advanced Heart Failure Team Pager [phone] (M-F; 7a - 5p)  Please contact [facility] for night-coverage after hours (4p -7a ) and  weekends on [email or web address]  Agree with above.   76 y/o woman with severe COPD, PAD s/p multiple stents recently found to have severe AS, EF 30-35% and high-grade LM and ostial LCX disease.   Transferred from [facility] for consideration of SAVR/CABG vs high risk LM/ostial Lcx PCI with BAV followed by TAVR.   This am with 9/10 CP and hypotension. ECG with diffuse ST depression.   General:  Elderly, frail, ill-appearing. No resp difficulty HEENT: normal Neck: supple. no JVD. Carotids 2+ bilat; + bruits. No lymphadenopathy or thryomegaly appreciated. Cor: PMI nondisplaced. Very distant heart sounds  Lungs: minimal air movement  Abdomen: soft, nontender, nondistended. No hepatosplenomegaly. No bruits or masses. Good bowel sounds. Extremities: no cyanosis, clubbing, rash, edema Neuro: alert & orientedx3, cranial nerves grossly intact. moves all 4 extremities w/o difficulty. Affect pleasant  Options extremely limited given size, PAD and severe COPD (was smoking until admission). She will not be a surgical candidate. She is now critically ill with unstable angina. Will place IABP in an attempt to stabilize and allow the Structural team to evaluate but doubt she will candidate for LM/Lcx PCI/TAVR withg poor access and fraility.   D/w Dr. Burt Knack and family   CRITICAL CARE Performed by: Glori Bickers  Total critical care time: 45 minutes  Critical care time was exclusive of separately billable procedures and treating other patients.  Critical care was necessary to treat or prevent imminent or life-threatening deterioration.  Critical care was time spent personally by me (independent of midlevel providers or residents) on the following activities: development of treatment plan with patient and/or surrogate as well as nursing, discussions with consultants, evaluation of patient's response to treatment, examination of patient, obtaining history from patient or surrogate, ordering and performing  treatments and interventions, ordering and review of laboratory studies, ordering and review of radiographic studies, pulse oximetry and re-evaluation of patient's condition.  Glori Bickers, MD  12:56 PM

## 2021-07-24 NOTE — Progress Notes (Addendum)
ANTICOAGULATION CONSULT NOTE  Pharmacy Consult for IV Heparin  Indication: chest pain/ACS  Patient Measurements: Height: 5\' 1"  (154.9 cm) Weight: 59 kg (130 lb) IBW/kg (Calculated) : 47.8 Heparin Dosing Weight: 59 kg   Labs: Recent Labs    07/21/21 2138 07/21/21 2329 07/22/21 0439 07/22/21 0729 07/22/21 1750 07/23/21 0430 07/27/2021 0058  HGB 10.0*  --  9.7*  --   --  9.2*  --   HCT 29.9*  --  28.1*  --   --  27.5*  --   PLT 204  --  194  --   --  181  --   APTT  --  38*  --   --   --   --   --   LABPROT  --  14.3 14.2  --   --   --   --   INR  --  1.1 1.1  --   --   --   --   HEPARINUNFRC  --   --   --    < > <0.10* 0.15* 0.24*  CREATININE 0.62  --  0.56  --   --  0.68  --   TROPONINIHS 3,727* 4,188*  --   --   --   --   --    < > = values in this interval not displayed.     Estimated Creatinine Clearance: 50.9 mL/min (by C-G formula based on SCr of 0.68 mg/dL).   Medical History: Past Medical History:  Diagnosis Date   Allergy    seasonal allergies   Anemia 2014   needed 5 units of blood d/t passing out, weak   Anxiety    Arthritis    Asthma    allergy induced asthma   CAD (coronary artery disease)    a. 05/2018 MV: EF 82%, no ischemia/infarct; b. 07/2021 NSTEMI/Cath: LM 56m, LAD 70p/m, LCX min irregs, RCA 20ost/p. AoV mean grad of 61mmHg w/ AVA of 0.44. EF 25-35%.   Cardiac murmur 12/12/2017   Cataract    left   Complication of anesthesia    arrhythmia following colonoscopy   Degenerative disc disease, lumbar    Depression    Diabetes mellitus    Diabetic neuropathy ([place]) 11/16/2015   GERD (gastroesophageal reflux disease)    H/O transfusion    patient was given 5 units of blood while at [place], blood type O+   HFrEF (heart failure with reduced ejection fraction) ([place])    a. 05/2018 Echo: EF 60-65%; b. 07/2021 Echo: EF 30-35%.   History of chicken pox    History of hiatal hernia    History of measles, mumps, or rubella    HOH (hard of hearing)     does not use hearing aides yet   Hyperlipidemia    Hypertension    Irregular heartbeat    Ischemic cardiomyopathy    a. 05/2018 Echo: EF 60-65%; b. 07/2021 Echo: EF 30-35%. GrII DD, sev mid-apical/anteroseptal/ant/apical HK.   Ischemic colitis ([place])    LVH (left ventricular hypertrophy) 12/24/2017   Echo Aug 2018   Mesenteric ischemia ([place])    a. 12/2019 s/p PTA/stenting of SMA w/ 6x29 balloon expandable VBX stent.   Moderate mitral regurgitation    Neuropathy    NSTEMI (non-ST elevated myocardial infarction) ([place]) 07/21/2021   Opiate use 11/16/2015   Peripheral arterial disease ([place])    a. 05/2018 s/p PTA/DCBA of L CFA; b. 11/2018 PTAR TP trunk and prox peroneal, DCBA R SFA & prox  pop, Viabahn stenting to mid/distal R SFA; c. 05/2019 DCBA/stenting L SFA.   Pes planus of both feet 11/16/2015   Plantar fasciitis of right foot 11/16/2015   Pneumonia    Severe aortic stenosis    a. 05/2018 Echo: EF 60-65%, GrI DD, mild AS (mean grad 72mmHg, AVA 1.31cm^2); b. 07/2021 Echo: Ef 30-35%, mild LVH, GrII DD, sev mid-apical/anteroseptal/ant/apical HK. Nl RV fxn. Mildly dil LA. Mod MR. Sev AS (mean grad 34.53mmHg, AVA 0.64 cm^2).   Wheezing     Assessment: Pharmacy consulted to dose heparin in this 76 year old female admitted with ACS/NSTEMI.  No prior anticoag noted.   Patient had cardiac catheterization 1/23 which showed critical left main stenosis. Plan for transfer to [facility] for CABG evaluation plus AVR versus left main stenting plus TAVR  Date Time HL Rate/comment 1/22 0729 <0.10 600 un/hr 1/22 1750 <0.10 900 un/hr 1/23     0430      0.15  1150 un/hr 1/24 0058   0.24 1350 un/hr 1/24 1228   0.42   1450 un/hr  Goal of Therapy:  Heparin level 0.3-0.7 units/ml Monitor platelets by anticoagulation protocol: Yes   Plan:  --Level due at this point. Discussed with lab, patient was refusing heparin level lab draw. Will attempt to obtain level again --If un-able to obtain level, plan to contact  provider to discuss therapy / options --Patient is pending transfer to [facility]  Update: Heparin level obtained and is therapeutic x 1. Will continue heparin at current rate of 1450 units/hr and re-check confirmatory HL in 8 hours. Daily CBC per protocol while on IV heparin  Andrea Holden  07/07/2021 12:01 PM

## 2021-07-24 NOTE — Discharge Summary (Addendum)
Physician Discharge Summary  Andrea Holden:[id number] DOB: 06-17-1946 DOA: 07/21/2021  PCP: Marguerita Merles, MD  Admit date: 07/21/2021 Discharge date: 07/14/2021  Discharge disposition: Discharged to [facility]   Recommendations for Outpatient Follow-Up:   Outpatient follow-up with PCP and cardiologist after discharge from the hospital   Discharge Diagnosis:   Principal Problem:   NSTEMI (non-ST elevated myocardial infarction) [facility]) Active Problems:   Diabetes mellitus without complication ([place])   Aortic stenosis   Hypertension with heart disease   CAD (coronary artery disease)   Acute on chronic diastolic CHF (congestive heart failure) (HCC)   Acute systolic CHF (congestive heart failure) ([place])    Discharge Condition: Stable.  Diet recommendation:  Diet Order             Diet regular Room service appropriate? Yes; Fluid consistency: Thin  Diet effective now           Diet - low sodium heart healthy                     Code Status: Full Code     Hospital Course:   [name] is a 76 y.o. female with medical history significant for CAD (previous NSTEMI), arrhythmia, mild aortic stenosis, diabetes mellitus, depression, anxiety, pneumonia, peripheral vascular disease, GERD, degenerative disc disease of lumbar spine, who presented to the hospital because of shortness of breath, upper back and neck pain.   She was found to have acute NSTEMI, acute on chronic diastolic CHF with acute systolic CHF.  She was treated with IV heparin infusion and IV Lasix.  She underwent left and right heart catheterization which revealed critical left main stenosis (95%) with significant calcifications, moderately to severely reduced LV systolic function with EF of 30 to 35%, moderate to severe mitral regurgitation, severe aortic stenosis, moderate pulmonary hypertension.  Dr. Fletcher Anon, cardiologist, recommended transfer to [facility] for further  evaluation by the cardiothoracic surgeon to evaluate for candidacy for surgical intervention.    Hospital course was complicated by sudden onset of shortness of breath on 07/28/2021.  She was requiring up to 10 L/min oxygen via HFNC.  This was attributed to acute pulmonary edema/CHF exacerbation.  She was treated with IV Lasix and was placed on BiPAP.  She was also given bronchodilators and IV steroids.  She only required BiPAP for about 1 and half hours and she was weaned off BiPAP to oxygen via nasal cannula at 6 L/min.  On reassessment, patient said she was feeling much better.  Andrea Holden, Therapist, sports, was at the bedside during this encounter.  She is deemed stable for transfer to [facility] for further management.    Medical Consultants:   Cardiologist   Discharge Exam:    Vitals:   07/27/2021 1400 07/14/2021 1600 07/23/2021 1633 07/15/2021 1635  BP: 111/64 (!) 89/47  (!) 101/57  Pulse: 82 63 82 73  Resp: (!) '22 13 17 ' (!) 22  Temp:  (!) 97.4 F (36.3 C)  (!) 97.4 F (36.3 C)  TempSrc:  Oral    SpO2: 95% 100% 95% 95%  Weight:      Height:         GEN: NAD SKIN: Warm and dry EYES: No pallor or icterus ENT: MMM CV: RRR, systolic murmur in the aortic area PULM: Improved air entry, occasional wheezing.  No rales heard ABD: soft, ND, NT, +BS CNS: AAO x 3, non focal EXT: No edema or tenderness  The results of significant diagnostics from this hospitalization (including imaging, microbiology, ancillary and laboratory) are listed below for reference.     Procedures and Diagnostic Studies:   CARDIAC CATHETERIZATION  Result Date: 07/23/2021   Mid LM lesion is 95% stenosed.   Prox LAD to Mid LAD lesion is 70% stenosed.   Ost RCA to Prox RCA lesion is 20% stenosed.   There is moderate to severe left ventricular systolic dysfunction.   LV end diastolic pressure is moderately elevated.   The left ventricular ejection fraction is 25-35% by visual estimate. 1.  Critical left main stenosis  with significant calcifications.  No obstructive disease involving the right coronary artery. 2.  Moderately to severely reduced LV systolic function with an EF of 30 to 35% with global hypokinesis.  Moderate to severe mitral regurgitation. 3.  Severe aortic stenosis with mean gradient of 34 mmHg and valve area of 0.44. 4.  Right heart catheterization showed moderately elevated wedge pressure, moderate pulmonary hypertension and severely reduced cardiac output.  Prominent V waves on pulmonary wedge tracing suggestive of significant mitral regurgitation. Recommendations: Transfer to [facility] for evaluation of CABG plus AVR versus left main stenting plus TAVR depending on candidacy for surgery.  She does have underlying COPD and also seem to have poor nutrition and weight loss and thus likely a high risk surgical candidate. Resume heparin at 4:30 PM.  Proceed with echocardiogram.   DG Chest Port 1 View  Result Date: 07/27/2021 CLINICAL DATA:  Respiratory distress EXAM: PORTABLE CHEST 1 VIEW COMPARISON:  Chest x-ray dated July 21, 2021 FINDINGS: Visualized cardiac and mediastinal contours are unchanged. New diffuse bilateral heterogeneous opacities. Small right-greater-than-left pleural effusions. IMPRESSION: 1. New diffuse bilateral heterogeneous opacities, likely due to worsening pulmonary edema. 2. Small right-greater-than-left pleural effusions. Electronically Signed   By: Yetta Glassman M.D.   On: 07/07/2021 12:07   ECHOCARDIOGRAM COMPLETE  Result Date: 07/23/2021    ECHOCARDIOGRAM REPORT   Patient Name:   Andrea Holden [facility] Date of Exam: 07/23/2021 Medical Rec #:  [id number]          Height:       61.0 in Accession #:    [id number]         Weight:       130.0 lb Date of Birth:  [birth year]          BSA:          1.573 m Patient Age:    76 years           BP:           94/53 mmHg Patient Gender: F                  HR:           78 bpm. Exam Location:  [facility] Procedure: 2D Echo, Cardiac Doppler  and Color Doppler Indications:     NSTEMI I21.4  History:         Patient has prior history of Echocardiogram examinations, most                  recent 06/04/2018. Signs/Symptoms:Murmur; Risk                  Factors:Hypertension.  Sonographer:     Sherrie Sport Referring Phys:  [place] Diagnosing Phys: Kathlyn Sacramento MD  Sonographer Comments: Suboptimal apical window. IMPRESSIONS  1. Left ventricular ejection fraction, by estimation, is 30 to 35%. The  left ventricle has moderately decreased function. The left ventricle demonstrates regional wall motion abnormalities (see scoring diagram/findings for description). There is mild left ventricular hypertrophy. Left ventricular diastolic parameters are consistent with Grade II diastolic dysfunction (pseudonormalization). There is severe hypokinesis of the left ventricular, mid-apical anteroseptal wall, anterior wall, anterior segment and apical segment.  2. Right ventricular systolic function is normal. The right ventricular size is normal. There is mildly elevated pulmonary artery systolic pressure.  3. Left atrial size was mildly dilated.  4. The mitral valve is abnormal. Moderate mitral valve regurgitation. No evidence of mitral stenosis. Severe mitral annular calcification.  5. The aortic valve is calcified. Aortic valve regurgitation is not visualized. Severe aortic valve stenosis. Aortic valve area, by VTI measures 0.64 cm. Aortic valve mean gradient measures 34.2 mmHg. FINDINGS  Left Ventricle: Left ventricular ejection fraction, by estimation, is 30 to 35%. The left ventricle has moderately decreased function. The left ventricle demonstrates regional wall motion abnormalities. Severe hypokinesis of the left ventricular, mid-apical anteroseptal wall, anterior wall, anterior segment and apical segment. The left ventricular internal cavity size was normal in size. There is mild left ventricular hypertrophy. Left ventricular diastolic parameters are  consistent with Grade II  diastolic dysfunction (pseudonormalization). Right Ventricle: The right ventricular size is normal. No increase in right ventricular wall thickness. Right ventricular systolic function is normal. There is mildly elevated pulmonary artery systolic pressure. The tricuspid regurgitant velocity is 3.14  m/s, and with an assumed right atrial pressure of 5 mmHg, the estimated right ventricular systolic pressure is 79.1 mmHg. Left Atrium: Left atrial size was mildly dilated. Right Atrium: Right atrial size was normal in size. Pericardium: There is no evidence of pericardial effusion. Mitral Valve: The mitral valve is abnormal. There is moderate thickening of the mitral valve leaflet(s). There is moderate calcification of the mitral valve leaflet(s). Severe mitral annular calcification. Moderate mitral valve regurgitation. No evidence  of mitral valve stenosis. MV peak gradient, 8.5 mmHg. The mean mitral valve gradient is 3.0 mmHg. Tricuspid Valve: The tricuspid valve is normal in structure. Tricuspid valve regurgitation is mild . No evidence of tricuspid stenosis. Aortic Valve: The aortic valve is calcified. Aortic valve regurgitation is not visualized. Severe aortic stenosis is present. Aortic valve mean gradient measures 34.2 mmHg. Aortic valve peak gradient measures 51.2 mmHg. Aortic valve area, by VTI measures  0.64 cm. Pulmonic Valve: The pulmonic valve was normal in structure. Pulmonic valve regurgitation is not visualized. No evidence of pulmonic stenosis. Aorta: The aortic root is normal in size and structure. Venous: The inferior vena cava was not well visualized. IAS/Shunts: No atrial level shunt detected by color flow Doppler.  LEFT VENTRICLE PLAX 2D LVIDd:         5.20 cm   Diastology LVIDs:         3.80 cm   LV e' medial:    4.24 cm/s LV PW:         1.00 cm   LV E/e' medial:  26.9 LV IVS:        0.95 cm   LV e' lateral:   3.81 cm/s LVOT diam:     2.00 cm   LV E/e' lateral: 29.9 LV  SV:         56 LV SV Index:   35 LVOT Area:     3.14 cm  RIGHT VENTRICLE RV Basal diam:  4.35 cm RV S prime:     14.30 cm/s TAPSE (M-mode): 3.0 cm LEFT ATRIUM  Index        RIGHT ATRIUM           Index LA diam:        4.70 cm 2.99 cm/m   RA Area:     14.30 cm LA Vol (A2C):   75.6 ml 48.07 ml/m  RA Volume:   35.80 ml  22.76 ml/m LA Vol (A4C):   50.4 ml 32.05 ml/m LA Biplane Vol: 67.4 ml 42.85 ml/m  AORTIC VALVE                     PULMONIC VALVE AV Area (Vmax):    0.57 cm      PV Vmax:        0.92 m/s AV Area (Vmean):   0.53 cm      PV Vmean:       61.800 cm/s AV Area (VTI):     0.64 cm      PV VTI:         0.171 m AV Vmax:           357.75 cm/s   PV Peak grad:   3.4 mmHg AV Vmean:          281.000 cm/s  PV Mean grad:   2.0 mmHg AV VTI:            0.866 m       RVOT Peak grad: 4 mmHg AV Peak Grad:      51.2 mmHg AV Mean Grad:      34.2 mmHg LVOT Vmax:         64.60 cm/s LVOT Vmean:        47.800 cm/s LVOT VTI:          0.177 m LVOT/AV VTI ratio: 0.20  AORTA Ao Root diam: 3.00 cm MITRAL VALVE                TRICUSPID VALVE MV Area (PHT): 3.45 cm     TR Peak grad:   39.4 mmHg MV Area VTI:   1.36 cm     TR Vmax:        314.00 cm/s MV Peak grad:  8.5 mmHg MV Mean grad:  3.0 mmHg     SHUNTS MV Vmax:       1.46 m/s     Systemic VTI:  0.18 m MV Vmean:      78.6 cm/s    Systemic Diam: 2.00 cm MV Decel Time: 220 msec     Pulmonic VTI:  0.192 m MV E velocity: 114.00 cm/s MV A velocity: 96.40 cm/s MV E/A ratio:  1.18 Kathlyn Sacramento MD Electronically signed by Kathlyn Sacramento MD Signature Date/Time: 07/23/2021/2:01:59 PM    Final      Labs:   Basic Metabolic Panel: Recent Labs  Lab 07/21/21 2138 07/22/21 0439 07/23/21 0430 07/21/2021 1228  NA 126* 126* 127* 125*  K 3.8 3.9 3.9 4.0  CL 92* 95* 94* 94*  CO2 '26 25 25 23  ' GLUCOSE 159* 128* 111* 248*  BUN 17 20 24* 33*  CREATININE 0.62 0.56 0.68 0.94  CALCIUM 9.0 9.0 8.6* 8.6*  MG  --   --  1.5*  --    GFR Estimated Creatinine Clearance:  44.3 mL/min (by C-G formula based on SCr of 0.94 mg/dL). Liver Function Tests: No results for input(s): AST, ALT, ALKPHOS, BILITOT, PROT, ALBUMIN in the last 168 hours. No results for input(s): LIPASE, AMYLASE in the last 168 hours. No results  for input(s): AMMONIA in the last 168 hours. Coagulation profile Recent Labs  Lab 07/21/21 2329 07/22/21 0439  INR 1.1 1.1    CBC: Recent Labs  Lab 07/21/21 2138 07/22/21 0439 07/23/21 0430 07/23/2021 1228  WBC 12.0* 10.2 9.3 16.3*  HGB 10.0* 9.7* 9.2* 10.8*  HCT 29.9* 28.1* 27.5* 32.8*  MCV 79.7* 79.8* 79.0* 82.0  PLT 204 194 181 285   Cardiac Enzymes: No results for input(s): CKTOTAL, CKMB, CKMBINDEX, TROPONINI in the last 168 hours. BNP: Invalid input(s): POCBNP CBG: Recent Labs  Lab 07/23/21 1118 07/04/2021 1222 07/29/2021 1611  GLUCAP 122* 262* 213*   D-Dimer No results for input(s): DDIMER in the last 72 hours. Hgb A1c Recent Labs    07/22/21 0439  HGBA1C 6.0*   Lipid Profile Recent Labs    07/22/21 0439  CHOL 107  HDL 46  LDLCALC 49  TRIG 58  CHOLHDL 2.3   Thyroid function studies Recent Labs    07/21/21 2329  TSH 1.232   Anemia work up No results for input(s): VITAMINB12, FOLATE, FERRITIN, TIBC, IRON, RETICCTPCT in the last 72 hours. Microbiology Recent Results (from the past 240 hour(s))  SARS CORONAVIRUS 2 (TAT 6-24 HRS) Nasopharyngeal Nasopharyngeal Swab     Status: None   Collection Time: 07/23/21  9:32 PM   Specimen: Nasopharyngeal Swab  Result Value Ref Range Status   SARS Coronavirus 2 NEGATIVE NEGATIVE Final    Comment: (NOTE) SARS-CoV-2 target nucleic acids are NOT DETECTED.  The SARS-CoV-2 RNA is generally detectable in upper and lower respiratory specimens during the acute phase of infection. Negative results do not preclude SARS-CoV-2 infection, do not rule out co-infections with other pathogens, and should not be used as the sole basis for treatment or other patient management  decisions. Negative results must be combined with clinical observations, patient history, and epidemiological information. The expected result is Negative.  Fact Sheet for Patients: SugarRoll.be  Fact Sheet for Healthcare Providers: https://www.woods-mathews.com/  This test is not yet approved or cleared by the [place] FDA and  has been authorized for detection and/or diagnosis of SARS-CoV-2 by FDA under an Emergency Use Authorization (EUA). This EUA will remain  in effect (meaning this test can be used) for the duration of the COVID-19 declaration under Se ction 564(b)(1) of the Act, 21 U.S.C. section 360bbb-3(b)(1), unless the authorization is terminated or revoked sooner.  Performed at [facility], [place] [address] [place], [place] [address]   MRSA Next Gen by PCR, Nasal     Status: None   Collection Time: 07/21/2021  2:01 PM   Specimen: Nasal Mucosa; Nasal Swab  Result Value Ref Range Status   MRSA by PCR Next Gen NOT DETECTED NOT DETECTED Final    Comment: (NOTE) The GeneXpert MRSA Assay (FDA approved for NASAL specimens only), is one component of a comprehensive MRSA colonization surveillance program. It is not intended to diagnose MRSA infection nor to guide or monitor treatment for MRSA infections. Test performance is not FDA approved in patients less than 68 years old. Performed at [facility], [address] [place], [place] [address]      Discharge Instructions:   Discharge Instructions     Diet - low sodium heart healthy   Complete by: As directed       Allergies as of 07/23/2021       Reactions   Meloxicam Other (See Comments)   GI bleeding   Nsaids Other (See Comments)   Gi bleeding  Sitagliptin Hives, Itching   Januvia         Medication List     STOP taking these medications    clopidogrel 75 MG tablet Commonly known as: PLAVIX   ipratropium-albuterol 0.5-2.5 (3) MG/3ML  Soln Commonly known as: DUONEB   Nebulizer/Tubing/Mouthpiece Kit   vitamin C 500 MG tablet Commonly known as: ASCORBIC ACID   zinc gluconate 50 MG tablet       TAKE these medications    acetaminophen 325 MG tablet Commonly known as: TYLENOL Take 2 tablets (650 mg total) by mouth every 6 (six) hours as needed for mild pain, moderate pain, headache or fever.   albuterol 108 (90 Base) MCG/ACT inhaler Commonly known as: VENTOLIN HFA INHALE 2 PUFFS INTO THE LUNGS EVERY 6 HOURS AS NEEDED FOR WHEEZING OR SHORTNESS OF BREATH   amLODipine 5 MG tablet Commonly known as: NORVASC Take 5 mg by mouth daily.   aspirin 81 MG EC tablet Take 1 tablet (81 mg total) by mouth daily. Swallow whole.   atorvastatin 40 MG tablet Commonly known as: LIPITOR Take 1 tablet (40 mg total) by mouth daily at 6 PM. What changed:  medication strength how much to take when to take this   fluticasone 50 MCG/ACT nasal spray Commonly known as: FLONASE Place 2 sprays into both nostrils daily as needed.   fluticasone-salmeterol 250-50 MCG/ACT Aepb Commonly known as: ADVAIR Inhale 1 puff into the lungs 2 (two) times daily.   gabapentin 400 MG capsule Commonly known as: NEURONTIN Take 1 capsule (400 mg total) by mouth 2 (two) times daily.   metoprolol succinate 25 MG 24 hr tablet Commonly known as: TOPROL-XL Take 0.5 tablets (12.5 mg total) by mouth daily.   montelukast 10 MG tablet Commonly known as: SINGULAIR Take 1 tablet (10 mg total) by mouth at bedtime.   Multivitamin Adults 50+ Tabs Take 1 tablet by mouth daily.   nicotine 14 mg/24hr patch Commonly known as: NICODERM CQ - dosed in mg/24 hours Place 1 patch (14 mg total) onto the skin daily.   pantoprazole 20 MG tablet Commonly known as: PROTONIX Take 1 tablet (20 mg total) by mouth daily.   sertraline 100 MG tablet Commonly known as: ZOLOFT TAKE 1 AND 1/2 TABLETS (150 MG TOTAL) BY MOUTH DAILY What changed:  how much to take how  to take this when to take this additional instructions   Vitamin D3 250 MCG (10000 UT) Tabs Take 10,000 Units by mouth daily.           If you experience worsening of your admission symptoms, develop shortness of breath, life threatening emergency, suicidal or homicidal thoughts you must seek medical attention immediately by calling 911 or calling your MD immediately  if symptoms less severe.   You must read complete instructions/literature along with all the possible adverse reactions/side effects for all the medicines you take and that have been prescribed to you. Take any new medicines after you have completely understood and accept all the possible adverse reactions/side effects.    Please note   You were cared for by a hospitalist during your hospital stay. If you have any questions about your discharge medications or the care you received while you were in the hospital after you are discharged, you can call the unit and asked to speak with the hospitalist on call if the hospitalist that took care of you is not available. Once you are discharged, your primary care physician will handle any further medical issues.  Please note that NO REFILLS for any discharge medications will be authorized once you are discharged, as it is imperative that you return to your primary care physician (or establish a relationship with a primary care physician if you do not have one) for your aftercare needs so that they can reassess your need for medications and monitor your lab values.       Time coordinating discharge: 40 minutes  Signed:  Savannah Erbe  [facility] 07/29/2021, 5:04 PM   Pager on [email or web address]. If 7PM-7AM, please contact night-coverage at [email or web address]

## 2021-07-24 NOTE — Progress Notes (Signed)
Called report to Ara Kussmaul, RN for patient to go to room 4E room 10.

## 2021-07-24 NOTE — Progress Notes (Signed)
°  Chaplain On-Call responded to Rapid Response notification for Room 235.  Patient was receiving active care from Medical Team.  Patient subsequently transferred to [facility].  Chaplain can be contacted by ICU Staff if additional support is requested.  Chaplain Pollyann Samples M.Div., [facility]

## 2021-07-25 ENCOUNTER — Inpatient Hospital Stay (HOSPITAL_COMMUNITY): Payer: Medicare Other

## 2021-07-25 ENCOUNTER — Inpatient Hospital Stay (HOSPITAL_COMMUNITY): Payer: Medicare Other | Admitting: Certified Registered"

## 2021-07-25 ENCOUNTER — Other Ambulatory Visit: Payer: Self-pay

## 2021-07-25 ENCOUNTER — Encounter (HOSPITAL_COMMUNITY): Payer: Self-pay | Admitting: Internal Medicine

## 2021-07-25 ENCOUNTER — Other Ambulatory Visit: Payer: Self-pay | Admitting: Cardiology

## 2021-07-25 ENCOUNTER — Encounter (HOSPITAL_COMMUNITY): Admission: AD | Disposition: E | Payer: Self-pay | Source: Other Acute Inpatient Hospital | Attending: Internal Medicine

## 2021-07-25 DIAGNOSIS — G9341 Metabolic encephalopathy: Secondary | ICD-10-CM

## 2021-07-25 DIAGNOSIS — I214 Non-ST elevation (NSTEMI) myocardial infarction: Secondary | ICD-10-CM

## 2021-07-25 DIAGNOSIS — R570 Cardiogenic shock: Secondary | ICD-10-CM

## 2021-07-25 DIAGNOSIS — I5021 Acute systolic (congestive) heart failure: Secondary | ICD-10-CM

## 2021-07-25 DIAGNOSIS — Z720 Tobacco use: Secondary | ICD-10-CM

## 2021-07-25 DIAGNOSIS — I350 Nonrheumatic aortic (valve) stenosis: Secondary | ICD-10-CM

## 2021-07-25 DIAGNOSIS — J9600 Acute respiratory failure, unspecified whether with hypoxia or hypercapnia: Secondary | ICD-10-CM

## 2021-07-25 DIAGNOSIS — I739 Peripheral vascular disease, unspecified: Secondary | ICD-10-CM

## 2021-07-25 DIAGNOSIS — I2510 Atherosclerotic heart disease of native coronary artery without angina pectoris: Secondary | ICD-10-CM

## 2021-07-25 DIAGNOSIS — J449 Chronic obstructive pulmonary disease, unspecified: Secondary | ICD-10-CM

## 2021-07-25 HISTORY — PX: ABDOMINAL AORTOGRAM: CATH118222

## 2021-07-25 HISTORY — PX: IABP INSERTION: CATH118242

## 2021-07-25 LAB — BASIC METABOLIC PANEL
Anion gap: 10 (ref 5–15)
Anion gap: 8 (ref 5–15)
BUN: 33 mg/dL — ABNORMAL HIGH (ref 8–23)
BUN: 35 mg/dL — ABNORMAL HIGH (ref 8–23)
CO2: 23 mmol/L (ref 22–32)
CO2: 23 mmol/L (ref 22–32)
Calcium: 8.7 mg/dL — ABNORMAL LOW (ref 8.9–10.3)
Calcium: 8.4 mg/dL — ABNORMAL LOW (ref 8.9–10.3)
Chloride: 95 mmol/L — ABNORMAL LOW (ref 98–111)
Chloride: 96 mmol/L — ABNORMAL LOW (ref 98–111)
Creatinine, Ser: 0.91 mg/dL (ref 0.44–1.00)
Creatinine, Ser: 1.01 mg/dL — ABNORMAL HIGH (ref 0.44–1.00)
GFR, Estimated: >60 mL/min (ref >60)
GFR, Estimated: 58 mL/min — ABNORMAL LOW (ref >60)
Glucose, Bld: 123 mg/dL — ABNORMAL HIGH (ref 70–99)
Glucose, Bld: 207 mg/dL — ABNORMAL HIGH (ref 70–99)
Potassium: 4.3 mmol/L (ref 3.5–5.1)
Potassium: 4.4 mmol/L (ref 3.5–5.1)
Sodium: 128 mmol/L — ABNORMAL LOW (ref 135–145)
Sodium: 127 mmol/L — ABNORMAL LOW (ref 135–145)

## 2021-07-25 LAB — CBC
HCT: 26.2 % — ABNORMAL LOW (ref 36.0–46.0)
Hemoglobin: 8.7 g/dL — ABNORMAL LOW (ref 12.0–15.0)
MCH: 26.9 pg (ref 26.0–34.0)
MCHC: 33.2 g/dL (ref 30.0–36.0)
MCV: 81.1 fL (ref 80.0–100.0)
Platelets: 187 10*3/uL (ref 150–400)
RBC: 3.23 MIL/uL — ABNORMAL LOW (ref 3.87–5.11)
RDW: 14.7 % (ref 11.5–15.5)
WBC: 10.7 10*3/uL — ABNORMAL HIGH (ref 4.0–10.5)
nRBC: 0 % (ref 0.0–0.2)

## 2021-07-25 LAB — GLUCOSE, CAPILLARY
Glucose-Capillary: 128 mg/dL — ABNORMAL HIGH (ref 70–99)
Glucose-Capillary: 137 mg/dL — ABNORMAL HIGH (ref 70–99)
Glucose-Capillary: 143 mg/dL — ABNORMAL HIGH (ref 70–99)
Glucose-Capillary: 143 mg/dL — ABNORMAL HIGH (ref 70–99)
Glucose-Capillary: 188 mg/dL — ABNORMAL HIGH (ref 70–99)
Glucose-Capillary: 227 mg/dL — ABNORMAL HIGH (ref 70–99)

## 2021-07-25 LAB — POCT I-STAT 7, (LYTES, BLD GAS, ICA,H+H)
Acid-base deficit: 6 mmol/L — ABNORMAL HIGH (ref 0.0–2.0)
Bicarbonate: 21.6 mmol/L (ref 20.0–28.0)
Calcium, Ion: 1.66 mmol/L (ref 1.15–1.40)
HCT: 38 % (ref 36.0–46.0)
Hemoglobin: 12.9 g/dL (ref 12.0–15.0)
O2 Saturation: 98 %
Potassium: 4.7 mmol/L (ref 3.5–5.1)
Sodium: 157 mmol/L — ABNORMAL HIGH (ref 135–145)
TCO2: 23 mmol/L (ref 22–32)
pCO2 arterial: 51.5 mmHg — ABNORMAL HIGH (ref 32.0–48.0)
pH, Arterial: 7.231 — ABNORMAL LOW (ref 7.350–7.450)
pO2, Arterial: 127 mmHg — ABNORMAL HIGH (ref 83.0–108.0)

## 2021-07-25 LAB — HEPARIN LEVEL (UNFRACTIONATED)
Heparin Unfractionated: 0.22 IU/mL — ABNORMAL LOW (ref 0.30–0.70)
Heparin Unfractionated: 0.51 IU/mL (ref 0.30–0.70)

## 2021-07-25 LAB — TYPE AND SCREEN
ABO/RH(D): O POS
Antibody Screen: NEGATIVE

## 2021-07-25 LAB — TROPONIN I (HIGH SENSITIVITY)
Troponin I (High Sensitivity): 1468 ng/L (ref <18)
Troponin I (High Sensitivity): 1949 ng/L (ref <18)

## 2021-07-25 LAB — LIPID PANEL
Cholesterol: 103 mg/dL (ref 0–200)
HDL: 41 mg/dL (ref >40)
LDL Cholesterol: 48 mg/dL (ref 0–99)
Total CHOL/HDL Ratio: 2.5 RATIO
Triglycerides: 70 mg/dL (ref <150)
VLDL: 14 mg/dL (ref 0–40)

## 2021-07-25 LAB — MAGNESIUM: Magnesium: 1.7 mg/dL (ref 1.7–2.4)

## 2021-07-25 SURGERY — IABP INSERTION
Anesthesia: LOCAL

## 2021-07-25 MED ORDER — FUROSEMIDE 10 MG/ML IJ SOLN
40.0000 mg | Freq: Once | INTRAMUSCULAR | Status: AC
  Administered 2021-07-25: 18:00:00 40 mg via INTRAVENOUS
  Filled 2021-07-25: qty 4

## 2021-07-25 MED ORDER — HEPARIN (PORCINE) IN NACL 1000-0.9 UT/500ML-% IV SOLN
INTRAVENOUS | Status: AC
  Filled 2021-07-25: qty 1000

## 2021-07-25 MED ORDER — HEPARIN (PORCINE) IN NACL 1000-0.9 UT/500ML-% IV SOLN
INTRAVENOUS | Status: DC | PRN
  Administered 2021-07-25 (×2): 500 mL

## 2021-07-25 MED ORDER — GABAPENTIN 400 MG PO CAPS
400.0000 mg | ORAL_CAPSULE | Freq: Two times a day (BID) | ORAL | Status: DC
  Filled 2021-07-25: qty 1

## 2021-07-25 MED ORDER — SERTRALINE HCL 50 MG PO TABS
150.0000 mg | ORAL_TABLET | Freq: Every day | ORAL | Status: DC
  Filled 2021-07-25: qty 1

## 2021-07-25 MED ORDER — SODIUM CHLORIDE 0.9 % IV SOLN: INTRAVENOUS | Status: DC | PRN

## 2021-07-25 MED ORDER — INSULIN ASPART 100 UNIT/ML IJ SOLN
0.0000 [IU] | Freq: Three times a day (TID) | INTRAMUSCULAR | Status: DC
  Administered 2021-07-25: 07:00:00 2 [IU] via SUBCUTANEOUS

## 2021-07-25 MED ORDER — IOHEXOL 350 MG/ML SOLN
95.0000 mL | Freq: Once | INTRAVENOUS | Status: AC | PRN
  Administered 2021-07-25: 10:00:00 95 mL via INTRAVENOUS

## 2021-07-25 MED ORDER — HEPARIN (PORCINE) 25000 UT/250ML-% IV SOLN
1450.0000 [IU]/h | INTRAVENOUS | Status: DC
  Administered 2021-07-25: 04:00:00 1450 [IU]/h via INTRAVENOUS

## 2021-07-25 MED ORDER — ATORVASTATIN CALCIUM 40 MG PO TABS
40.0000 mg | ORAL_TABLET | Freq: Every day | ORAL | Status: DC
  Filled 2021-07-25: qty 1

## 2021-07-25 MED ORDER — FENTANYL CITRATE PF 50 MCG/ML IJ SOSY
25.0000 ug | PREFILLED_SYRINGE | INTRAMUSCULAR | Status: DC | PRN
  Administered 2021-07-25 (×5): 50 ug via INTRAVENOUS
  Administered 2021-07-26: 25 ug via INTRAVENOUS
  Administered 2021-07-27: 50 ug via INTRAVENOUS
  Filled 2021-07-25 (×7): qty 1

## 2021-07-25 MED ORDER — UMECLIDINIUM-VILANTEROL 62.5-25 MCG/ACT IN AEPB
1.0000 | INHALATION_SPRAY | Freq: Every day | RESPIRATORY_TRACT | Status: DC
  Filled 2021-07-25: qty 14

## 2021-07-25 MED ORDER — HEPARIN SODIUM (PORCINE) 1000 UNIT/ML IJ SOLN
INTRAMUSCULAR | Status: DC | PRN
  Administered 2021-07-25: 5000 [IU] via INTRAVENOUS

## 2021-07-25 MED ORDER — FUROSEMIDE 10 MG/ML IJ SOLN
20.0000 mg | Freq: Two times a day (BID) | INTRAMUSCULAR | Status: DC
  Filled 2021-07-25: qty 2

## 2021-07-25 MED ORDER — NITROGLYCERIN IN D5W 200-5 MCG/ML-% IV SOLN
5.0000 ug/min | INTRAVENOUS | Status: DC
  Administered 2021-07-25: 12:00:00 5 ug/min via INTRAVENOUS
  Filled 2021-07-25: qty 250

## 2021-07-25 MED ORDER — FLUTICASONE PROPIONATE 50 MCG/ACT NA SUSP: 1.0000 | Freq: Every day | NASAL | Status: DC | PRN

## 2021-07-25 MED ORDER — PROPOFOL 1000 MG/100ML IV EMUL
INTRAVENOUS | Status: AC
  Administered 2021-07-25: 14:00:00 5 ug/kg/min via INTRAVENOUS
  Filled 2021-07-25: qty 100

## 2021-07-25 MED ORDER — METOPROLOL SUCCINATE ER 25 MG PO TB24
25.0000 mg | ORAL_TABLET | Freq: Every day | ORAL | Status: DC
  Filled 2021-07-25: qty 1

## 2021-07-25 MED ORDER — IOHEXOL 350 MG/ML SOLN
INTRAVENOUS | Status: DC | PRN
  Administered 2021-07-25: 14:00:00 43 mL

## 2021-07-25 MED ORDER — SODIUM CHLORIDE 0.9 % IV SOLN: 250.0000 mL | INTRAVENOUS | Status: DC | PRN

## 2021-07-25 MED ORDER — METOPROLOL SUCCINATE ER 25 MG PO TB24: 12.5000 mg | ORAL_TABLET | Freq: Every day | ORAL | Status: DC

## 2021-07-25 MED ORDER — NOREPINEPHRINE 4 MG/250ML-% IV SOLN
INTRAVENOUS | Status: AC
  Administered 2021-07-25: 15:00:00 2 ug/min via INTRAVENOUS
  Filled 2021-07-25: qty 250

## 2021-07-25 MED ORDER — IPRATROPIUM-ALBUTEROL 0.5-2.5 (3) MG/3ML IN SOLN
3.0000 mL | Freq: Four times a day (QID) | RESPIRATORY_TRACT | Status: DC | PRN
  Administered 2021-07-25: 10:00:00 3 mL via RESPIRATORY_TRACT
  Filled 2021-07-25: qty 3

## 2021-07-25 MED ORDER — MONTELUKAST SODIUM 10 MG PO TABS: 10.0000 mg | ORAL_TABLET | Freq: Every day | ORAL | Status: DC

## 2021-07-25 MED ORDER — MIDAZOLAM HCL 2 MG/2ML IJ SOLN: 1.0000 mg | INTRAMUSCULAR | Status: DC | PRN

## 2021-07-25 MED ORDER — NOREPINEPHRINE 4 MG/250ML-% IV SOLN: 0.0000 ug/min | INTRAVENOUS | Status: DC

## 2021-07-25 MED ORDER — MIDAZOLAM HCL 2 MG/2ML IJ SOLN
1.0000 mg | INTRAMUSCULAR | Status: DC | PRN
  Administered 2021-07-25: 15:00:00 1 mg via INTRAVENOUS

## 2021-07-25 MED ORDER — FENTANYL CITRATE (PF) 100 MCG/2ML IJ SOLN: 25.0000 ug | INTRAMUSCULAR | Status: DC | PRN

## 2021-07-25 MED ORDER — ASPIRIN EC 81 MG PO TBEC: 81.0000 mg | DELAYED_RELEASE_TABLET | Freq: Every day | ORAL | Status: DC

## 2021-07-25 MED ORDER — SODIUM CHLORIDE 0.9% FLUSH
3.0000 mL | Freq: Two times a day (BID) | INTRAVENOUS | Status: DC
  Administered 2021-07-25 – 2021-07-27 (×5): 3 mL via INTRAVENOUS
  Administered 2021-07-28: 40 mL via INTRAVENOUS
  Administered 2021-07-28 – 2021-07-30 (×3): 3 mL via INTRAVENOUS

## 2021-07-25 MED ORDER — ETOMIDATE 2 MG/ML IV SOLN
INTRAVENOUS | Status: DC | PRN
  Administered 2021-07-25: 10 mg via INTRAVENOUS

## 2021-07-25 MED ORDER — MAGNESIUM SULFATE 2 GM/50ML IV SOLN
2.0000 g | Freq: Once | INTRAVENOUS | Status: AC
  Administered 2021-07-25: 12:00:00 2 g via INTRAVENOUS
  Filled 2021-07-25: qty 50

## 2021-07-25 MED ORDER — POLYETHYLENE GLYCOL 3350 17 G PO PACK
17.0000 g | PACK | Freq: Every day | ORAL | Status: DC
  Administered 2021-07-26 – 2021-07-27 (×2): 17 g
  Filled 2021-07-25 (×2): qty 1

## 2021-07-25 MED ORDER — NICOTINE 14 MG/24HR TD PT24
14.0000 mg | MEDICATED_PATCH | Freq: Every day | TRANSDERMAL | Status: DC
  Filled 2021-07-25: qty 1

## 2021-07-25 MED ORDER — ONDANSETRON HCL 4 MG/2ML IJ SOLN
4.0000 mg | Freq: Four times a day (QID) | INTRAMUSCULAR | Status: DC | PRN
  Administered 2021-07-30: 4 mg via INTRAVENOUS
  Filled 2021-07-25: qty 2

## 2021-07-25 MED ORDER — NOREPINEPHRINE 4 MG/250ML-% IV SOLN
2.0000 ug/min | INTRAVENOUS | Status: DC
  Administered 2021-07-25: 15:00:00 2 ug/min via INTRAVENOUS
  Administered 2021-07-26: 7 ug/min via INTRAVENOUS
  Administered 2021-07-26: 8 ug/min via INTRAVENOUS
  Administered 2021-07-27: 9 ug/min via INTRAVENOUS
  Filled 2021-07-25 (×3): qty 250

## 2021-07-25 MED ORDER — HEPARIN (PORCINE) 25000 UT/250ML-% IV SOLN
1400.0000 [IU]/h | INTRAVENOUS | Status: DC
  Administered 2021-07-25: 20:00:00 700 [IU]/h via INTRAVENOUS
  Administered 2021-07-26: 19:00:00 1100 [IU]/h via INTRAVENOUS
  Administered 2021-07-27: 1300 [IU]/h via INTRAVENOUS
  Administered 2021-07-28: 1400 [IU]/h via INTRAVENOUS
  Filled 2021-07-25 (×5): qty 250

## 2021-07-25 MED ORDER — IPRATROPIUM-ALBUTEROL 0.5-2.5 (3) MG/3ML IN SOLN
3.0000 mL | RESPIRATORY_TRACT | Status: DC
  Administered 2021-07-25 – 2021-07-27 (×12): 3 mL via RESPIRATORY_TRACT
  Filled 2021-07-25 (×12): qty 3

## 2021-07-25 MED ORDER — PANTOPRAZOLE SODIUM 40 MG IV SOLR
40.0000 mg | Freq: Every day | INTRAVENOUS | Status: DC
  Administered 2021-07-25 – 2021-07-30 (×6): 40 mg via INTRAVENOUS
  Filled 2021-07-25 (×6): qty 40

## 2021-07-25 MED ORDER — LIDOCAINE HCL (PF) 1 % IJ SOLN
INTRAMUSCULAR | Status: AC
  Filled 2021-07-25: qty 30

## 2021-07-25 MED ORDER — LOSARTAN POTASSIUM 25 MG PO TABS: 25.0000 mg | ORAL_TABLET | Freq: Every day | ORAL | Status: DC

## 2021-07-25 MED ORDER — PROPOFOL 1000 MG/100ML IV EMUL
5.0000 ug/kg/min | INTRAVENOUS | Status: DC
  Administered 2021-07-25 – 2021-07-26 (×2): 35 ug/kg/min via INTRAVENOUS
  Filled 2021-07-25 (×3): qty 100

## 2021-07-25 MED ORDER — SODIUM CHLORIDE 0.9 % IV SOLN: 250.0000 mL | INTRAVENOUS | Status: DC

## 2021-07-25 MED ORDER — INSULIN ASPART 100 UNIT/ML IJ SOLN
1.0000 [IU] | INTRAMUSCULAR | Status: DC
  Administered 2021-07-25: 17:00:00 2 [IU] via SUBCUTANEOUS
  Administered 2021-07-25 – 2021-07-26 (×2): 1 [IU] via SUBCUTANEOUS
  Administered 2021-07-26: 2 [IU] via SUBCUTANEOUS
  Administered 2021-07-26: 1 [IU] via SUBCUTANEOUS
  Administered 2021-07-27: 2 [IU] via SUBCUTANEOUS
  Administered 2021-07-27: 1 [IU] via SUBCUTANEOUS
  Administered 2021-07-27: 2 [IU] via SUBCUTANEOUS

## 2021-07-25 MED ORDER — CHLORHEXIDINE GLUCONATE CLOTH 2 % EX PADS
6.0000 | MEDICATED_PAD | Freq: Every day | CUTANEOUS | Status: DC
  Administered 2021-07-25 – 2021-07-29 (×5): 6 via TOPICAL

## 2021-07-25 MED ORDER — NITROGLYCERIN 0.4 MG SL SUBL: 0.4000 mg | SUBLINGUAL_TABLET | SUBLINGUAL | Status: DC | PRN

## 2021-07-25 MED ORDER — HEPARIN (PORCINE) 25000 UT/250ML-% IV SOLN
1450.0000 [IU]/h | INTRAVENOUS | Status: DC
  Filled 2021-07-25: qty 250

## 2021-07-25 MED ORDER — SODIUM CHLORIDE 0.9 % IV SOLN: INTRAVENOUS | Status: DC

## 2021-07-25 MED ORDER — MIDAZOLAM HCL 2 MG/2ML IJ SOLN
INTRAMUSCULAR | Status: AC
  Filled 2021-07-25: qty 2

## 2021-07-25 MED ORDER — ORAL CARE MOUTH RINSE
15.0000 mL | OROMUCOSAL | Status: DC
  Administered 2021-07-25 – 2021-07-27 (×15): 15 mL via OROMUCOSAL

## 2021-07-25 MED ORDER — ACETAMINOPHEN 325 MG PO TABS: 650.0000 mg | ORAL_TABLET | ORAL | Status: DC | PRN

## 2021-07-25 MED ORDER — IOHEXOL 350 MG/ML SOLN
75.0000 mL | Freq: Once | INTRAVENOUS | Status: AC | PRN
  Administered 2021-07-25: 15:00:00 75 mL via INTRAVENOUS

## 2021-07-25 MED ORDER — SODIUM CHLORIDE 0.9% FLUSH: 3.0000 mL | INTRAVENOUS | Status: DC | PRN

## 2021-07-25 MED ORDER — CHLORHEXIDINE GLUCONATE 0.12% ORAL RINSE (MEDLINE KIT)
15.0000 mL | Freq: Two times a day (BID) | OROMUCOSAL | Status: DC
  Administered 2021-07-25 – 2021-07-27 (×4): 15 mL via OROMUCOSAL

## 2021-07-25 MED ORDER — HEPARIN SODIUM (PORCINE) 1000 UNIT/ML IJ SOLN
INTRAMUSCULAR | Status: AC
  Filled 2021-07-25: qty 10

## 2021-07-25 MED ORDER — MONTELUKAST SODIUM 10 MG PO TABS
10.0000 mg | ORAL_TABLET | Freq: Every day | ORAL | Status: DC
  Administered 2021-07-26 – 2021-07-27 (×3): 10 mg
  Filled 2021-07-25 (×3): qty 1

## 2021-07-25 MED ORDER — SUCCINYLCHOLINE CHLORIDE 200 MG/10ML IV SOSY
PREFILLED_SYRINGE | INTRAVENOUS | Status: DC | PRN
  Administered 2021-07-25: 120 mg via INTRAVENOUS

## 2021-07-25 MED ORDER — LIDOCAINE HCL (PF) 1 % IJ SOLN
INTRAMUSCULAR | Status: DC | PRN
  Administered 2021-07-25: 15 mL

## 2021-07-25 MED ORDER — DOCUSATE SODIUM 50 MG/5ML PO LIQD
100.0000 mg | Freq: Two times a day (BID) | ORAL | Status: DC
  Administered 2021-07-26 – 2021-07-27 (×5): 100 mg
  Filled 2021-07-25 (×5): qty 10

## 2021-07-25 MED ORDER — FENTANYL CITRATE (PF) 100 MCG/2ML IJ SOLN
INTRAMUSCULAR | Status: AC
  Administered 2021-07-25: 15:00:00 100 ug via INTRAVENOUS
  Filled 2021-07-25: qty 2

## 2021-07-25 SURGICAL SUPPLY — 9 items
BALLN IABP SENSA PLUS 7.5F 40C (BALLOONS) ×2
BALLOON IABP SENS PLUS 7.5F40C (BALLOONS) IMPLANT
CATH INFINITI 5FR ANG PIGTAIL (CATHETERS) ×1 IMPLANT
KIT ESSENTIALS PG (KITS) ×2 IMPLANT
KIT HEART LEFT (KITS) ×2 IMPLANT
PACK CARDIAC CATHETERIZATION (CUSTOM PROCEDURE TRAY) ×2 IMPLANT
SHEATH PINNACLE 5F 10CM (SHEATH) ×1 IMPLANT
TRANSDUCER W/STOPCOCK (MISCELLANEOUS) ×2 IMPLANT
WIRE EMERALD 3MM-J .035X150CM (WIRE) ×1 IMPLANT

## 2021-07-25 NOTE — Progress Notes (Signed)
ANTICOAGULATION CONSULT NOTE  Pharmacy Consult for heparin Indication:  CAD awaiting CABG consult  Allergies  Allergen Reactions   Meloxicam Other (See Comments)    GI bleeding   Nsaids Other (See Comments)    Gi bleeding   Sitagliptin Hives and Itching    Januvia     Patient Measurements: Height: 5\' 1"  (154.9 cm) Weight: 61.7 kg (136 lb 0.4 oz) IBW/kg (Calculated) : 47.8 Heparin Dosing Weight: 60kg  Vital Signs: Temp: 97.8 F (36.6 C) (01/25 0736) Temp Source: Oral (01/25 0736) BP: 84/51 (01/25 1126) Pulse Rate: 82 (01/25 1126)  Labs: Recent Labs    07/23/21 0430 07/26/2021 0058 07/12/2021 1228 07/29/2021 2017 07/29/2021 0505 07/25/21 1100  HGB 9.2*  --  10.8*  --  8.7*  --   HCT 27.5*  --  32.8*  --  26.2*  --   PLT 181  --  285  --  187  --   HEPARINUNFRC 0.15*   < > 0.42 0.42 0.22* 0.51  CREATININE 0.68  --  0.94  --  0.91  --    < > = values in this interval not displayed.     Estimated Creatinine Clearance: 45.7 mL/min (by C-G formula based on SCr of 0.91 mg/dL).   Medical History: Past Medical History:  Diagnosis Date   Acute on chronic diastolic CHF (congestive heart failure) (HCC) 07/11/2021   Allergy    seasonal allergies   Anemia 2014   needed 5 units of blood d/t passing out, weak   Anxiety    Arthritis    Asthma    allergy induced asthma   CAD (coronary artery disease)    a. 05/2018 MV: EF 82%, no ischemia/infarct; b. 07/2021 NSTEMI/Cath: LM 76m, LAD 70p/m, LCX min irregs, RCA 20ost/p. AoV mean grad of w/ AVA of 0.44. EF 25-35%.   Cardiac murmur 12/12/2017   Cataract    left   Complication of anesthesia    arrhythmia following colonoscopy   Degenerative disc disease, lumbar    Depression    Diabetes mellitus    Diabetic neuropathy (HCC) 11/16/2015   GERD (gastroesophageal reflux disease)    H/O transfusion    patient was given 5 units of blood while at [facility], blood type O+   HFrEF (heart failure with reduced ejection fraction)  (HCC)    a. 05/2018 Echo: EF 60-65%; b. 07/2021 Echo: EF 30-35%.   History of chicken pox    History of hiatal hernia    History of measles, mumps, or rubella    HOH (hard of hearing)    does not use hearing aides yet   Hyperlipidemia    Hypertension    Irregular heartbeat    Ischemic cardiomyopathy    a. 05/2018 Echo: EF 60-65%; b. 07/2021 Echo: EF 30-35%. GrII DD, sev mid-apical/anteroseptal/ant/apical HK.   Ischemic colitis (HCC)    LVH (left ventricular hypertrophy) 12/24/2017   Echo Aug 2018   Mesenteric ischemia (HCC)    a. 12/2019 s/p PTA/stenting of SMA w/ 6x29 balloon expandable VBX stent.   Moderate mitral regurgitation    Neuropathy    NSTEMI (non-ST elevated myocardial infarction) (HCC) 07/21/2021   Opiate use 11/16/2015   Peripheral arterial disease (HCC)    a. 05/2018 s/p PTA/DCBA of L CFA; b. 11/2018 PTAR TP trunk and prox peroneal, DCBA R SFA & prox pop, Viabahn stenting to mid/distal R SFA; c. 05/2019 DCBA/stenting L SFA.   Pes planus of both feet 11/16/2015  Plantar fasciitis of right foot 11/16/2015   Pneumonia    Severe aortic stenosis    a. 05/2018 Echo: EF 60-65%, GrI DD, mild AS (mean grad 52mmHg, AVA 1.31cm^2); b. 07/2021 Echo: Ef 30-35%, mild LVH, GrII DD, sev mid-apical/anteroseptal/ant/apical HK. Nl RV fxn. Mildly dil LA. Mod MR. Sev AS (mean grad 34.79mmHg, AVA 0.64 cm^2).   Wheezing     Assessment: 76yo female was admitted to [facility] for NSTEMI, tx'd to [facility] after cath for further w/u by CVTS, to continue heparin; levels have been at goal at 1450 units/hr, pt arrived from [facility] with heparin still running.  Heparin level is therapeutic, H/H down slightly this am, no S/Sx bleeding noted.  Goal of Therapy:  Heparin level 0.3-0.7 units/ml Monitor platelets by anticoagulation protocol: Yes   Plan:  Continue heparin infusion at 1450 units/hr  Daily heparin level and CBC   Arrie Senate, PharmD, [place], [facility] Clinical Pharmacist [phone] Please check AMION  for all [facility] Pharm[facility] numbers 07/31/2021

## 2021-07-25 NOTE — Consult Note (Signed)
Neurology Consultation  Reason for Consult: Code Stroke Referring Physician: Bensimhon  CC: unresponsive  History is obtained from: Physician, medical record, and family  HPI: Andrea Holden is a 76 y.o. female with a past medical history including  PAD s/p multiple lower extremity PTAs/stents, mesenteric ischemia/ischemic colitis, s/p gastric bypass with extreme weight loss, HLD, DMII, COPD with ongoing tobacco abuse, and HL. She has had a complex cardiac history with severe aortic stenosis and acute systolic heart failure who was initially transferred from [facility] ED on 1/23. She was seen today by Dr. Haroldine Laws for HF optimization and consultation for CABG with AVR vs PCI with TAVR. EF is noted at 35% with severe calcification and stenosis within her cardiac anatomy. She was having a balloon pump placed in cath lab when she stopped speaking, but would still follow commands up until the end of the procedure. She then proceeded to go unresponsive and subsequently required intubation. CT and CTA completed with no LVO or acute intracranial process noted.    LKW: 1300 TNK given?: no, fully heparinized for cardiac procedure  IR Thrombectomy? No,  Modified Rankin Scale: 2-Slight disability-UNABLE to perform all activities but does not need assistance  ROS: Unable to obtain due to altered mental status.   Past Medical History:  Diagnosis Date   Acute on chronic diastolic CHF (congestive heart failure) ([place]) 07/22/2021   Allergy    seasonal allergies   Anemia 2014   needed 5 units of blood d/t passing out, weak   Anxiety    Arthritis    Asthma    allergy induced asthma   CAD (coronary artery disease)    a. 05/2018 MV: EF 82%, no ischemia/infarct; b. 07/2021 NSTEMI/Cath: LM 45m, LAD 70p/m, LCX min irregs, RCA 20ost/p. AoV mean grad of 51mmHg w/ AVA of 0.44. EF 25-35%.   Cardiac murmur 12/12/2017   Cataract    left   Complication of anesthesia    arrhythmia following colonoscopy    Degenerative disc disease, lumbar    Depression    Diabetes mellitus    Diabetic neuropathy ([place]) 11/16/2015   GERD (gastroesophageal reflux disease)    H/O transfusion    patient was given 5 units of blood while at [place], blood type O+   HFrEF (heart failure with reduced ejection fraction) ([place])    a. 05/2018 Echo: EF 60-65%; b. 07/2021 Echo: EF 30-35%.   History of chicken pox    History of hiatal hernia    History of measles, mumps, or rubella    HOH (hard of hearing)    does not use hearing aides yet   Hyperlipidemia    Hypertension    Irregular heartbeat    Ischemic cardiomyopathy    a. 05/2018 Echo: EF 60-65%; b. 07/2021 Echo: EF 30-35%. GrII DD, sev mid-apical/anteroseptal/ant/apical HK.   Ischemic colitis ([place])    LVH (left ventricular hypertrophy) 12/24/2017   Echo Aug 2018   Mesenteric ischemia ([place])    a. 12/2019 s/p PTA/stenting of SMA w/ 6x29 balloon expandable VBX stent.   Moderate mitral regurgitation    Neuropathy    NSTEMI (non-ST elevated myocardial infarction) ([place]) 07/21/2021   Opiate use 11/16/2015   Peripheral arterial disease ([place])    a. 05/2018 s/p PTA/DCBA of L CFA; b. 11/2018 PTAR TP trunk and prox peroneal, DCBA R SFA & prox pop, Viabahn stenting to mid/distal R SFA; c. 05/2019 DCBA/stenting L SFA.   Pes planus of both feet 11/16/2015   Plantar fasciitis of  right foot 11/16/2015   Pneumonia    Severe aortic stenosis    a. 05/2018 Echo: EF 60-65%, GrI DD, mild AS (mean grad 52mmHg, AVA 1.31cm^2); b. 07/2021 Echo: Ef 30-35%, mild LVH, GrII DD, sev mid-apical/anteroseptal/ant/apical HK. Nl RV fxn. Mildly dil LA. Mod MR. Sev AS (mean grad 34.61mmHg, AVA 0.64 cm^2).   Wheezing      Family History  Problem Relation Age of Onset   Asthma Mother    COPD Mother    Arthritis Father    Depression Father    Heart disease Father    Hypertension Father    Stroke Father    Heart attack Father    Arthritis Brother    Depression Brother    Diabetes Brother     Heart disease Brother    Hyperlipidemia Brother    Hypertension Brother    Stroke Brother    Vision loss Brother    Heart attack Brother    Diabetes Maternal Grandmother    Thyroid disease Daughter    Colitis Daughter    Breast cancer Neg Hx      Social History:   reports that she has been smoking cigarettes. She started smoking about 61 years ago. She has a 27.50 pack-year smoking history. She has never used smokeless tobacco. She reports current alcohol use. She reports that she does not currently use drugs after having used the following drugs: Marijuana.  Medications  Current Facility-Administered Medications:    0.9 %  sodium chloride infusion, 250 mL, Intravenous, Continuous, Einar Grad, RPH   acetaminophen (TYLENOL) tablet 650 mg, 650 mg, Oral, Q4H PRN, Lyda Jester M, PA-C   [START ON 07/26/2021] aspirin EC tablet 81 mg, 81 mg, Oral, Daily, Rosita Fire, Brittainy M, PA-C   atorvastatin (LIPITOR) tablet 40 mg, 40 mg, Oral, Daily, Rosita Fire, Brittainy M, PA-C   Chlorhexidine Gluconate Cloth 2 % PADS 6 each, 6 each, Topical, Daily, McQuaid, Douglas B, MD   docusate (COLACE) 50 MG/5ML liquid 100 mg, 100 mg, Per Tube, BID, Noemi Chapel P, DO   fentaNYL (SUBLIMAZE) injection 25 mcg, 25 mcg, Intravenous, Q15 min PRN, Noemi Chapel P, DO   fentaNYL (SUBLIMAZE) injection 25-50 mcg, 25-50 mcg, Intravenous, Q30 min PRN, Estill Cotta, NP   fluticasone (FLONASE) 50 MCG/ACT nasal spray 1 spray, 1 spray, Each Nare, Daily PRN, Rosita Fire, Brittainy M, PA-C   gabapentin (NEURONTIN) capsule 400 mg, 400 mg, Oral, BID, Simmons, Brittainy M, PA-C   insulin aspart (novoLOG) injection 1-3 Units, 1-3 Units, Subcutaneous, Q4H, Clark, Laura P, DO   ipratropium-albuterol (DUONEB) 0.5-2.5 (3) MG/3ML nebulizer solution 3 mL, 3 mL, Nebulization, Q6H PRN, Rosita Fire, Brittainy M, PA-C, 3 mL at 07/01/2021 1026   ipratropium-albuterol (DUONEB) 0.5-2.5 (3) MG/3ML nebulizer solution 3 mL, 3 mL,  Nebulization, Q4H, Clark, Laura P, DO   midazolam (VERSED) 2 MG/2ML injection, , , ,    midazolam (VERSED) injection 1 mg, 1 mg, Intravenous, Q15 min PRN, Noemi Chapel P, DO   midazolam (VERSED) injection 1 mg, 1 mg, Intravenous, Q2H PRN, Noemi Chapel P, DO, 1 mg at 07/28/2021 1519   montelukast (SINGULAIR) tablet 10 mg, 10 mg, Oral, QHS, Simmons, Brittainy M, PA-C   nitroGLYCERIN 50 mg in dextrose 5 % 250 mL (0.2 mg/mL) infusion, 5 mcg/min, Intravenous, Titrated, Bensimhon, Shaune Pascal, MD, Last Rate: 1.5 mL/hr at 07/02/2021 1216, 5 mcg/min at 07/20/2021 1216   norepinephrine (LEVOPHED) 4mg  in 271mL (0.016 mg/mL) premix infusion, 2-10 mcg/min, Intravenous, Titrated, Einar Grad, [facility]  ondansetron (ZOFRAN) injection 4 mg, 4 mg, Intravenous, Q6H PRN, Lyda Jester M, PA-C   pantoprazole (PROTONIX) injection 40 mg, 40 mg, Intravenous, Daily, Carlis Abbott, Laura P, DO   polyethylene glycol (MIRALAX / GLYCOLAX) packet 17 g, 17 g, Per Tube, Daily, Carlis Abbott, Laura P, DO   propofol (DIPRIVAN) 1000 MG/100ML infusion, 5-80 mcg/kg/min, Intravenous, Titrated, Shafer, Devon, NP, Last Rate: 7.44 mL/hr at 07/14/2021 1500, 20 mcg/kg/min at 07/11/2021 1500   sertraline (ZOLOFT) tablet 150 mg, 150 mg, Oral, Daily, Rosita Fire, Brittainy M, PA-C   sodium chloride flush (NS) 0.9 % injection 3 mL, 3 mL, Intravenous, Q12H, Rosita Fire, Brittainy M, PA-C   Exam: Current vital signs: BP (!) 142/60    Pulse (!) 104    Temp 97.8 F (36.6 C) (Oral)    Resp 10    Ht 5\' 1"  (1.549 m)    Wt 61.7 kg    SpO2 96%    BMI 25.70 kg/m  Vital signs in last 24 hours: Temp:  [97.4 F (36.3 C)-98.3 F (36.8 C)] 97.8 F (36.6 C) (01/25 0736) Pulse Rate:  [45-116] 104 (01/25 1407) Resp:  [10-91] 10 (01/25 1407) BP: (84-189)/(47-134) 142/60 (01/25 1407) SpO2:  [84 %-100 %] 96 % (01/25 1407) FiO2 (%):  [100 %] 100 % (01/25 1407) Weight:  [61.7 kg] 61.7 kg (01/25 0300)  GENERAL: Awake, alert, in no acute distress Psych: Affect appropriate for  situation, patient is calm and cooperative with examination Head: Normocephalic and atraumatic, without obvious abnormality EENT: Normal conjunctivae, dry mucous membranes, no OP obstruction LUNGS: Normal respiratory effort. Non-labored breathing on room air CV: Regular rate and rhythm on telemetry ABDOMEN: Soft, non-tender, non-distended Extremities: warm, well perfused, without obvious deformity  NEURO:  Mental Status: Unresponsive to verbal stimuli. Grimace with noxious stimuli to the left extremities.  Cranial Nerves: Resists eye opening, pupils equal round reactive, does not blink from either side, face symmetric. Motor: moving left UE and LE somewhat spontaneously and to noxious stimulation on those extremities grimaces but not moving the right much. Sensation: Grimaces to noxious stimulation on the left but not so much on the right. Coordination: Unable to assess due to mentation  Gait: Deferred  NIHSS: 1a Level of Conscious.: 3 1b LOC Questions: 2 1c LOC Commands: 2 2 Best Gaze: 0 3 Visual: 0 4 Facial Palsy:0  5a Motor Arm - left: 2 5b Motor Arm - Right: 4 6a Motor Leg - Left: 2 6b Motor Leg - Right: 4 7 Limb Ataxia: 0 8 Sensory: 0 9 Best Language: 3  10 Dysarthria: U 11 Extinct. and Inatten.: 0  TOTAL: 22    Labs I have reviewed labs in epic and the results pertinent to this consultation are:   CBC    Component Value Date/Time   WBC 10.7 (H) 07/04/2021 0505   RBC 3.23 (L) 07/29/2021 0505   HGB 8.7 (L) 07/06/2021 0505   HGB 12.9 11/21/2015 1416   HCT 26.2 (L) 07/23/2021 0505   HCT 39.7 11/21/2015 1416   PLT 187 07/23/2021 0505   PLT 226 11/21/2015 1416   MCV 81.1 07/31/2021 0505   MCV 82 11/21/2015 1416   MCV 82 10/12/2014 0516   MCH 26.9 07/13/2021 0505   MCHC 33.2 07/01/2021 0505   RDW 14.7 07/31/2021 0505   RDW 16.7 (H) 11/21/2015 1416   RDW 14.9 (H) 10/12/2014 0516   LYMPHSABS 1,488 11/30/2019 1240   LYMPHSABS 1.7 11/21/2015 1416   LYMPHSABS  1.5 10/12/2014 0516   MONOABS 0.5 07/23/2018  1254   MONOABS 0.7 10/12/2014 0516   EOSABS 281 11/30/2019 1240   EOSABS 0.3 11/21/2015 1416   EOSABS 0.1 10/12/2014 0516   BASOSABS 77 11/30/2019 1240   BASOSABS 0.0 11/21/2015 1416   BASOSABS 0.1 10/12/2014 0516    CMP     Component Value Date/Time   NA 128 (L) 07/21/2021 0505   NA 133 (L) 03/07/2016 1317   NA 135 10/12/2014 0516   K 4.3 07/29/2021 0505   K 4.3 10/12/2014 0516   CL 95 (L) 07/31/2021 0505   CL 109 10/12/2014 0516   CO2 23 07/29/2021 0505   CO2 24 10/12/2014 0516   GLUCOSE 123 (H) 07/07/2021 0505   GLUCOSE 146 (H) 10/12/2014 0516   BUN 33 (H) 07/09/2021 0505   BUN 18 03/07/2016 1317   BUN 39 (H) 10/12/2014 0516   CREATININE 0.91 07/29/2021 0505   CREATININE 0.81 11/30/2019 1240   CALCIUM 8.7 (L) 07/02/2021 0505   CALCIUM 7.6 (L) 10/12/2014 0516   PROT 6.2 (L) 12/26/2019 1112   PROT 6.7 11/21/2015 1416   PROT 6.3 (L) 10/11/2014 1203   ALBUMIN 3.6 12/26/2019 1112   ALBUMIN 4.1 11/21/2015 1416   ALBUMIN 3.1 (L) 10/11/2014 1203   AST 15 12/26/2019 1112   AST 21 10/11/2014 1203   ALT 11 12/26/2019 1112   ALT 13 (L) 10/11/2014 1203   ALKPHOS 70 12/26/2019 1112   ALKPHOS 66 10/11/2014 1203   BILITOT 0.7 12/26/2019 1112   BILITOT 0.2 11/21/2015 1416   BILITOT 0.4 10/11/2014 1203   GFRNONAA >60 07/23/2021 0505   GFRNONAA 73 11/30/2019 1240   GFRAA >60 12/31/2019 0651   GFRAA 84 11/30/2019 1240    Lipid Panel     Component Value Date/Time   CHOL 103 07/05/2021 0505   CHOL 182 11/21/2015 1416   TRIG 70 07/01/2021 0505   HDL 41 07/28/2021 0505   HDL 44 11/21/2015 1416   CHOLHDL 2.5 07/08/2021 0505   VLDL 14 07/04/2021 0505   LDLCALC 48 07/14/2021 0505   LDLCALC 68 11/30/2019 1240   ABG - suggestive of hypoxia  Imaging I have reviewed the images obtained: CT-scan of the brain- No evidence of acute intracranial abnormality.  ASPECTS of 10. Mild chronic small vessel ischemic disease. CTA Head and  Neck-  No large vessel occlusion. Severe bilateral vertebral artery origin stenoses. Cervical carotid atherosclerosis without significant stenosis. Pulmonary edema and pleural effusions   Assessment:  76 year old who was on the table in the Cath Lab for balloon pump placement-prior extensive cardiac history, suddenly became unresponsive with some focality on exam-right-sided weakness and altered mental status Wide differential including stroke versus basilar artery occlusion for which a stat CTA was done-basilar artery occlusion ruled out. Hypoxic on ABG-toxic metabolic encephalopathy also possible. Transient ischemic attack versus hypoperfusion from hypotension also likely but vitals remained stable throughout the procedure according to procedure logs. Low on the differentials-seizures  Impression: Differentials include initial concern for basilar artery occlusion (ruled out on CTA), seizure,TIA, stroke related to hypotension/hypoperfusion versus toxic metabolic encephalopathy in the setting of hypoxia  Recommendations: - Limit sedation for accurate neurological exam  - Maintain map greater than 65 for profusion  - EEG to evaluate for seizure activity - consider MRI when stable-may not be possible with the balloon pump-check with radiology. - continue ASA 81mg   - Resume home statin when medically appropriate - Stroke education and PT/OT/ST when medically stable - HgbA1c, fasting lipid panel - Frequent neuro checks - Echocardiogram -  Risk factor modification - Telemetry monitoring -Okay to resume heparin as low suspicion for large stroke  Discussed with Dr. Lake Bells and family of the patient (daughter, son-in-law - both RNs)   Janine Ores, DNP, FNP-BC [facility] Pager: [phone]   Attending Neurohospitalist Addendum Patient seen and examined with APP/Resident. Agree with the history and physical as documented above. Agree with the plan as documented, which I  helped formulate. I have independently reviewed the chart, obtained history, review of systems and examined the patient.I have personally reviewed pertinent head/neck/spine imaging (CT/MRI). Please feel free to call with any questions.  -- Amie Portland, MD Neurologist [facility] Pager: [phone]  CRITICAL CARE ATTESTATION Performed by: Amie Portland, MD Total critical care time: 69 minutes Critical care time was exclusive of separately billable procedures and treating other patients and/or supervising APPs/Residents/Students Critical care was necessary to treat or prevent imminent or life-threatening deterioration due to strokelike symptoms, toxic metabolic encephalopathy versus hypoxic encephalopathy. This patient is critically ill and at significant risk for neurological worsening and/or death and care requires constant monitoring. Critical care was time spent personally by me on the following activities: development of treatment plan with patient and/or surrogate as well as nursing, discussions with consultants, evaluation of patient's response to treatment, examination of patient, obtaining history from patient or surrogate, ordering and performing treatments and interventions, ordering and review of laboratory studies, ordering and review of radiographic studies, pulse oximetry, re-evaluation of patient's condition, participation in multidisciplinary rounds and medical decision making of high complexity in the care of this patient.

## 2021-07-25 NOTE — Progress Notes (Signed)
[facility] P[facility]M evening rounds  Updated family bedside at length Now following commands Will give lasix, monitor UOP Consider extubation in AM if UOP adequate and mental status continues to improve  Heber , MD [place] [facility] Pager: [phone] Cell: [phone] After 7:00 pm call Elink  [phone]

## 2021-07-25 NOTE — Transfer of Care (Signed)
Immediate Anesthesia Transfer of Care Note  Patient: Andrea Holden  Procedure(s) Performed: AN AD HOC INTUBATION  Patient Location: Cath Lab  Anesthesia Type:Intubation only  Level of Consciousness: drowsy  Airway & Oxygen Therapy: Patient placed on Ventilator (see vital sign flow sheet for setting)  Post-op Assessment: Report given to RN  Post vital signs: Reviewed and stable  Last Vitals:  Vitals Value Taken Time  BP 142/60 2021-07-29 1407  Temp    Pulse 104 07/29/2021 1407  Resp 10 07/29/2021 1407  SpO2 86 % Jul 29, 2021 1407    Last Pain:  Vitals:   29-Jul-2021 0800  TempSrc:   PainSc: 0-No pain         Complications: No notable events documented.

## 2021-07-25 NOTE — Consult Note (Signed)
NAME:  Andrea Holden, MRN:  [id number], DOB:  30-Jan-1946, LOS: 1 ADMISSION DATE:  07/18/2021, CONSULTATION DATE:  07/12/2021 REFERRING MD:  Harl Bowie, CHIEF COMPLAINT:  Acute respirtatory failure   History of Present Illness:  Andrea Holden is a 76 y.o. female who is seen in consultation at the request of Dr. Harl Bowie for recommendations on further evaluation and management of acute respiratory failure.   They presented to the [facility] on 1/23 with a chief complaint of dyspnea, back pain, and NSTEMI, who was found to have LV dysfunction in the setting of sever aortic stenosis and critical left mitral disease  They have a pertinent past medical history of aortic stenosis, PAD s/p multiple LE PTAs/stents, mesenteric ischemia/ischemic colitis, s/p gastric bypass, HLD, DMII, COPD, active smoker.  On 1/25 they went to the cath lab for placement of a IABP. During the procedure the patient became unresponsive. They were intubated. A code stroke was called.   PCCM was consulted for assistance  Pertinent  Medical History  Aortic stenosis, PAD s/p multiple LE PTAs/stents, mesenteric ischemia/ischemic colitis, s/p gastric bypass, HLD, DMII, COPD, active smoker  Significant Hospital Events: Including procedures, antibiotic start and stop dates in addition to other pertinent events     Interim History / Subjective:    Objective   Blood pressure (!) 139/48, pulse 88, temperature 97.8 F (36.6 C), temperature source Oral, resp. rate 18, height 5\' 1"  (1.549 m), weight 61.7 kg, SpO2 97 %.        Intake/Output Summary (Last 24 hours) at 07/23/2021 1413 Last data filed at 07/09/2021 0313 Gross per 24 hour  Intake 240 ml  Output 300 ml  Net -60 ml   Filed Weights   07/21/2021 0300  Weight: 61.7 kg    Examination: General: In bed, NAD, frail HEENT: MM pink/moist, anicteric, atraumatic Neuro: RASS +1, PERRL 38mm, sedated, MAE CV: S1S2, NSR, no m/r/g appreciated PULM: coarse in all lobes,  trachea midline, chest expansion symmetric GI: soft, bsx4 hypoactive, non-tender   Extremities: cool/dry, no pretibial edema, capillary refill less than 3 seconds  Skin:  no rashes or lesions noted   Labs WBC 16.3>10.7 HGB 10.8>8.7 Trop [id number] NA 125>128 Bun 33>33 MG 1.7  Resolved Hospital Problem list     Assessment & Plan:  Acute respiratory failure, unspecified Suspect secondary to pulmonary edema, present on CT chest. Small bl effusions. Intubated for unresponsiveness in cath lab COPD Active smoker -LTVV strategy with tidal volumes of 4-8 cc/kg ideal body weight -Goal plateau pressures less than 30 and driving pressures less than 15 -Wean PEEP/FiO2 for SpO2 88-96% -VAP bundle -Daily SAT and SBT -PAD bundle with Propofol gtt and fentanyl gtt -RASS goal 0 to -1 -follow up CXR and ABG.  Acute metabolic encephalopathy vs stroke Code stroke in cath lab. Became unresponsive. Moving all extremities on exam -Neurology consulted and head CT pending. Follow up reads -neuroprotective measures as able -Frequent neuro exams  Severe aortic stenosis Left mitral disease Acute systolic heart failure with cardiogenic shock CAD NSTEMI EF 30-35%, MS with impella likely contraindicated due to severe AS. Trop [id number]. On levophed.  -Goal MAP 60-65. Peripheral levophed ordered. Titrate to goal.  -IABP placed by HF -Holding GDMT due to hypotension -HF following appreciate assistance -Goal K above 4, goal MG above 2 -Large dye load in cath lab. Consider diuresis in setting of kidney function.  DMII -Blood Glucose goal 140-180. -SSI  GERD -PPI  HX PAD HX HLD HX mesenteric ischemia -  continue lipitor 40 -monitor exam  Hyponatremia ?secodnary to HF.  NA 125>128  Best Practice (right click and "Reselect all SmartList Selections" daily)   Diet/type: NPO w/ meds via tube DVT prophylaxis: SCD, pending head scan GI prophylaxis: PPI Lines: Arterial Line and yes and it is  still needed Foley:  Yes, and it is still needed Code Status:  full code Last date of multidisciplinary goals of care discussion [per primary]  Labs   CBC: Recent Labs  Lab 07/21/21 2138 07/22/21 0439 07/23/21 0430 07/19/2021 1228 07/15/2021 0505  WBC 12.0* 10.2 9.3 16.3* 10.7*  HGB 10.0* 9.7* 9.2* 10.8* 8.7*  HCT 29.9* 28.1* 27.5* 32.8* 26.2*  MCV 79.7* 79.8* 79.0* 82.0 81.1  PLT 204 194 181 285 [id number]    Basic Metabolic Panel: Recent Labs  Lab 07/21/21 2138 07/22/21 0439 07/23/21 0430 07/23/2021 1228 07/18/2021 0505  NA 126* 126* 127* 125* 128*  K 3.8 3.9 3.9 4.0 4.3  CL 92* 95* 94* 94* 95*  CO2 26 25 25 23 23   GLUCOSE 159* 128* 111* 248* 123*  BUN 17 20 24* 33* 33*  CREATININE 0.62 0.56 0.68 0.94 0.91  CALCIUM 9.0 9.0 8.6* 8.6* 8.7*  MG  --   --  1.5*  --  1.7   GFR: Estimated Creatinine Clearance: 45.7 mL/min (by C-G formula based on SCr of 0.91 mg/dL). Recent Labs  Lab 07/22/21 0439 07/23/21 0430 07/01/2021 1228 07/09/2021 0505  WBC 10.2 9.3 16.3* 10.7*  LATICACIDVEN  --   --  1.2  --     Liver Function Tests: No results for input(s): AST, ALT, ALKPHOS, BILITOT, PROT, ALBUMIN in the last 168 hours. No results for input(s): LIPASE, AMYLASE in the last 168 hours. No results for input(s): AMMONIA in the last 168 hours.  ABG No results found for: PHART, PCO2ART, PO2ART, HCO3, TCO2, ACIDBASEDEF, O2SAT   Coagulation Profile: Recent Labs  Lab 07/21/21 2329 07/22/21 0439  INR 1.1 1.1    Cardiac Enzymes: No results for input(s): CKTOTAL, CKMB, CKMBINDEX, TROPONINI in the last 168 hours.  HbA1C: Hgb A1c MFr Bld  Date/Time Value Ref Range Status  07/22/2021 04:39 AM 6.0 (H) 4.8 - 5.6 % Final    Comment:    (NOTE)         Prediabetes: 5.7 - 6.4         Diabetes: >6.4         Glycemic control for adults with diabetes: <7.0   11/30/2019 12:40 PM 5.9 (H) <5.7 % of total Hgb Final    Comment:    For someone without known diabetes, a hemoglobin  A1c value  between 5.7% and 6.4% is consistent with prediabetes and should be confirmed with a  follow-up test. . For someone with known diabetes, a value <7% indicates that their diabetes is well controlled. A1c targets should be individualized based on duration of diabetes, age, comorbid conditions, and other considerations. . This assay result is consistent with an increased risk of diabetes. . Currently, no consensus exists regarding use of hemoglobin A1c for diagnosis of diabetes for children. .     CBG: Recent Labs  Lab 07/20/2021 1611 07/03/2021 2221 07/29/2021 0627 07/04/2021 1135 07/23/2021 1352  GLUCAP 213* 181* 137* 128* 227*    Review of Systems:   Unable to obtain ROS due to patient status  Past Medical History:  She,  has a past medical history of Acute on chronic diastolic CHF (congestive heart failure) ([place]) (07/01/2021), Allergy, Anemia (2014), Anxiety,  Arthritis, Asthma, CAD (coronary artery disease), Cardiac murmur (12/12/2017), Cataract, Complication of anesthesia, Degenerative disc disease, lumbar, Depression, Diabetes mellitus, Diabetic neuropathy ([place]) (11/16/2015), GERD (gastroesophageal reflux disease), H/O transfusion, HFrEF (heart failure with reduced ejection fraction) ([place]), History of chicken pox, History of hiatal hernia, History of measles, mumps, or rubella, HOH (hard of hearing), Hyperlipidemia, Hypertension, Irregular heartbeat, Ischemic cardiomyopathy, Ischemic colitis ([place]), LVH (left ventricular hypertrophy) (12/24/2017), Mesenteric ischemia ([place]), Moderate mitral regurgitation, Neuropathy, NSTEMI (non-ST elevated myocardial infarction) ([place]) (07/21/2021), Opiate use (11/16/2015), Peripheral arterial disease ([place]), Pes planus of both feet (11/16/2015), Plantar fasciitis of right foot (11/16/2015), Pneumonia, Severe aortic stenosis, and Wheezing.   Surgical History:   Past Surgical History:  Procedure Laterality Date   ABDOMINAL HYSTERECTOMY     APPLICATION OF  WOUND VAC Left 07/23/2018   Procedure: APPLICATION OF WOUND VAC;  Surgeon: Algernon Huxley, MD;  Location: [facility] ORS;  Service: Vascular;  Laterality: Left;   banck injections     CATARACT EXTRACTION W/PHACO Right 05/30/2015   Procedure: CATARACT EXTRACTION PHACO AND INTRAOCULAR LENS PLACEMENT ([place]);  Surgeon: Birder Robson, MD;  Location: [facility];  Service: Ophthalmology;  Laterality: Right;  [place] 00:57 AP% 20.9 CDE 11.99 fluid pack lot #[id number]   CATARACT EXTRACTION W/PHACO Left 11/11/2019   Procedure: CATARACT EXTRACTION PHACO AND INTRAOCULAR LENS PLACEMENT ([place]) LEFT DIABETIC;  Surgeon: Birder Robson, MD;  Location: [facility];  Service: Ophthalmology;  Laterality: Left;  Lot [id number] H [place]: 00:47.5 CDE: 6.24   CHOLECYSTECTOMY  1970   COLON SURGERY  2013   blocked colon   COLONOSCOPY N/A 12/29/2019   Procedure: COLONOSCOPY;  Surgeon: Virgel Manifold, MD;  Location: [facility] END[facility];  Service: Endoscopy;  Laterality: N/A;   COLONOSCOPY WITH PROPOFOL N/A 01/20/2018   Procedure: COLONOSCOPY WITH PROPOFOL;  Surgeon: Lucilla Lame, MD;  Location: [facility] ENDOSCOPY;  Service: Endoscopy;  Laterality: N/A;   ENDARTERECTOMY FEMORAL Left 07/08/2018   Procedure: ENDARTERECTOMY FEMORAL;  Surgeon: Algernon Huxley, MD;  Location: [facility];  Service: Vascular;  Laterality: Left;   EYE SURGERY Right    cataract surgery   FOOT FUSION Right 2018   metal in foot   [place]  2010   lost 178 lbs and regained 40 lbs last few years   HUMERUS FRACTURE SURGERY Right    metal plate with screws   internal bleeding  2016   ulcer in past   LOWER EXTREMITY ANGIOGRAM Left 07/08/2018   Procedure: LOWER EXTREMITY ANGIOGRAM;  Surgeon: Algernon Huxley, MD;  Location: [facility];  Service: Vascular;  Laterality: Left;   LOWER EXTREMITY ANGIOGRAPHY Left 05/27/2018   Procedure: LOWER EXTREMITY ANGIOGRAPHY;  Surgeon: Algernon Huxley, MD;  Location: [facility];  Service: Cardiovascular;   Laterality: Left;   LOWER EXTREMITY ANGIOGRAPHY Right 12/07/2018   Procedure: LOWER EXTREMITY ANGIOGRAPHY;  Surgeon: Algernon Huxley, MD;  Location: [place] [facility];  Service: Cardiovascular;  Laterality: Right;   LOWER EXTREMITY ANGIOGRAPHY Left 05/10/2019   Procedure: LOWER EXTREMITY ANGIOGRAPHY;  Surgeon: Algernon Huxley, MD;  Location: [place];  Service: Cardiovascular;  Laterality: Left;   MOUTH SURGERY     root canals and crowns and extractions   MOUTH SURGERY  09/30/2019   OVARY SURGERY     RIGHT/LEFT HEART CATH AND CORONARY ANGIOGRAPHY N/A 07/23/2021   Procedure: RIGHT/LEFT HEART CATH AND CORONARY ANGIOGRAPHY;  Surgeon: Wellington Hampshire, MD;  Location: [place] [facility];  Service: Cardiovascular;  Laterality: N/A;   SKIN GRAFT Right 2018   RT foot. foot has been rebuilt.  it is full of metal   TENOTOMY ACHILLES TENDON Right    Percuntaneous. metal in foot   TONSILLECTOMY     VISCERAL ANGIOGRAPHY N/A 12/31/2019   Procedure: VISCERAL ANGIOGRAPHY;  Surgeon: Algernon Huxley, MD;  Location: [place] [facility];  Service: Cardiovascular;  Laterality: N/A;   WOUND DEBRIDEMENT Left 07/23/2018   Procedure: DEBRIDEMENT WOUND;  Surgeon: Algernon Huxley, MD;  Location: [facility];  Service: Vascular;  Laterality: Left;     Social History:   reports that she has been smoking cigarettes. She started smoking about 61 years ago. She has a 27.50 pack-year smoking history. She has never used smokeless tobacco. She reports current alcohol use. She reports that she does not currently use drugs after having used the following drugs: Marijuana.   Family History:  Her family history includes Arthritis in her brother and father; Asthma in her mother; COPD in her mother; Colitis in her daughter; Depression in her brother and father; Diabetes in her brother and maternal grandmother; Heart attack in her brother and father; Heart disease in her brother and father; Hyperlipidemia in her brother; Hypertension  in her brother and father; Stroke in her brother and father; Thyroid disease in her daughter; Vision loss in her brother. There is no history of Breast cancer.   Allergies Allergies  Allergen Reactions   Meloxicam Other (See Comments)    GI bleeding   Nsaids Other (See Comments)    Gi bleeding   Sitagliptin Hives and Itching    Januvia      Home Medications  Prior to Admission medications   Medication Sig Start Date End Date Taking? Authorizing Provider  acetaminophen (TYLENOL) 325 MG tablet Take 2 tablets (650 mg total) by mouth every 6 (six) hours as needed for mild pain, moderate pain, headache or fever. 12/31/19   Vashti Hey, MD  albuterol (VENTOLIN HFA) 108 (90 Base) MCG/ACT inhaler INHALE 2 PUFFS INTO THE LUNGS EVERY 6 HOURS AS NEEDED FOR WHEEZING OR SHORTNESS OF BREATH 04/04/20   [name], PA-C  amLODipine (NORVASC) 5 MG tablet Take 5 mg by mouth daily. 04/16/21   [provider]  aspirin EC 81 MG EC tablet Take 1 tablet (81 mg total) by mouth daily. Swallow whole. 07/25/2021   Jennye Boroughs, MD  atorvastatin (LIPITOR) 40 MG tablet Take 1 tablet (40 mg total) by mouth daily at 6 PM. 07/23/21   Jennye Boroughs, MD  Cholecalciferol (VITAMIN D3) 250 MCG (10000 UT) TABS Take 10,000 Units by mouth daily.     [provider]  fluticasone (FLONASE) 50 MCG/ACT nasal spray Place 2 sprays into both nostrils daily as needed. 08/24/19   [name], PA-C  fluticasone-salmeterol (ADVAIR) 250-50 MCG/ACT AEPB Inhale 1 puff into the lungs 2 (two) times daily. 07/02/21   [provider]  gabapentin (NEURONTIN) 400 MG capsule Take 1 capsule (400 mg total) by mouth 2 (two) times daily. 05/31/20   [name], PA-C  metoprolol succinate (TOPROL-XL) 25 MG 24 hr tablet Take 0.5 tablets (12.5 mg total) by mouth daily. 07/21/2021   Jennye Boroughs, MD  montelukast (SINGULAIR) 10 MG tablet Take 1 tablet (10 mg total) by mouth at bedtime. 08/24/19   [name], PA-C   Multiple Vitamins-Minerals (MULTIVITAMIN ADULTS 50+) TABS Take 1 tablet by mouth daily. 11/02/18   Poulose, Bethel Born, NP  nicotine (NICODERM CQ - DOSED IN MG/24  HOURS) 14 mg/24hr patch Place 1 patch (14 mg total) onto the skin daily. 07/06/2021   Jennye Boroughs, MD  pantoprazole (PROTONIX) 20 MG tablet Take 1 tablet (20 mg total) by mouth daily. 05/31/20   [name], PA-C  sertraline (ZOLOFT) 100 MG tablet TAKE 1 AND 1/2 TABLETS (150 MG TOTAL) BY MOUTH DAILY Patient taking differently: Take 100 mg by mouth daily. 08/24/19   [name], PA-C     Critical care time: 40 minutes   Redmond School., MSN, APRN, AGACNP-BC [facility]  07/08/2021 , 3:22 PM  Please see [email or web address] for pager details  If no response, please call [phone] After hours, please call Elink at [phone]

## 2021-07-25 NOTE — Code Documentation (Signed)
Stroke Response Nurse Documentation Code Documentation  Clarene DEMESHA BOORMAN is a 76 y.o. female admitted with shortness of breath and back pain and ruled in for NSTEMI. While having a balloon pump placed, she became unresponsive. Code stroke was activated by cath lab. Past medical hx of PAD s/p multiple lower arterial interventions, aortic stenosis, hyperlipidemia, diabetes, COPD, tobacco abuse .   Stroke team at the bedside on patient arrival. Labs drawn pre procedure. Patient intubated by anesthesia team. 2H RN notified to assist with balloon pump management. Patient to CT with team. NIHSS 31, see documentation for details and code stroke times. Patient with decreased LOC, disoriented, not following commands, bilateral hemianopia, bilateral arm weakness, bilateral leg weakness, bilateral decreased sensation, Global aphasia , dysarthria , and Visual  neglect on exam. Patient began moving all extremities upon arrival to 2H (post CT).  The following imaging was completed: CT, CTA head and neck. Patient is not a candidate for IV Thrombolytic due to recent procedure with heparin. Patient is not a candidate for IR due to no LVO.    Ferman Hamming Stroke Response RN

## 2021-07-25 NOTE — Consult Note (Addendum)
HEART AND [place] VALVE TEAM  Cardiology Consultation:   Patient ID: ASLI BUSTAMANTE MRN: [id number]; DOB: 1946/06/27  Admit date: 07/23/2021 Date of Consult: 07/08/2021  Primary Care Provider: Marguerita Merles, MD [facility] Cardiologist: Ida Rogue, MD   Patient Profile:   GERALDA FRASIER is a 76 y.o. female with a hx of PAD s/p multiple lower extremity PTAs/stents, mesenteric ischemia/ischemic colitis, s/p gastric bypass with extreme weight loss, HLD, DMII, COPD with ongoing tobacco abuse, and HL who is being seen today for the evaluation of severe aortic stenosis at the request of Dr. Fletcher Anon.   History of Present Illness:   Ms. Harlan is originally from [place] however has been in [place] for approximately 20 years. She lives alone in the [place] area with her two dogs. She has one daughter who she recently reconnected with after being estranged for many years. She is retired from [employer]. She is rather sedentary at baseline due to lots of PAD issues. She walks with a cane, mostly due to profound weakness. She has a long hx of tobacco use, smoking about 1 PPD. She quit in 2018 however restarted during [place]. She does not follow with a pulmonologist. She previously weighed @ 325lb and underwent gastric bypass 6-7 years ago and initially lost about 75lb with a stable plateau however has more recently had another 100lb (or greater) loss over the last several years. Over this time, she reports worsening fatigue and weakness. This has progressed with the addition of exertional dyspnea and chest pain within the last month. She was previously in denial about her worsening symptoms and never really sought further workup. She states that activities such as housework and taking care of her animals has progressively become more difficult. She continues to drive and perform ADL/IADLs. She follows with a dentist regularly however has  rather poor dentition with recent reports of "possible cavity".   She was initially referred to Dr. Rockey Situ for the evaluation of aortic stenosis and cardiac clearance prior to left femoral endarterectomy. This was followed by extensive percutaneous stenting of right tibioperoneal trunk and proximal peroneal artery, transluminal angioplasty of the right SFA, proximal popliteal artery angioplasty with stent placement per Dr. Lucky Cowboy 05/2019 with most recent stent to the SMA 12/2019. In most recent follow up with Dr. Rockey Situ in 03/2020, she seemed to be doing well with no significant change.    She then presented to [facility] 07/23/21 with SOB and mid back pain found to rule in for NSTEMI with HsT peak at 4188. Echocardiogram showed LV dysfunction with an LVEF at 30-35% with severe hypokinesis of the left ventricular, mid-apical, anteroseptal wall (previously 60-65%), normal RV, mod MR, and severe aortic stenosis with a mean gradient at 67mmHg, peak  6mmHg, SVI 35, DI 0.20, and AVA by VTI at 0.64cm2. She was admitted per cardiology service and was placed on IV heparin and Lasix. Cardiac catheterization performed 07/23/21 showed critical left main stenosis at 95% w/ significant calcifications, prox-mid LAD 70% stenosis, and normal RCA. There was severe AS with mean gradient confirmed at 34 mmHg and valve area of 0.44, moderate to severe mitral regurgitation with prominent V waves on pulmonary wedge tracing suggestive of significant mitral regurgitation, and RHC findings showing elevated filling pressures and low output. RAP 13, mPCWP 22, CO 3.11, CI 1.97L/min/m2. Plan at that time was to transfer her care to [facility] for CABG/AVR evaluation versus high risk PCI/TAVR with structural heart and advanced heart  failure evaluations.    Unfortunately, due to limited bed availability, her transfer was somewhat delayed. She has remained stable with no recurrent chest pain. She continues on IV Heparin due to LM disease. WBC down-trending,  likely reactive. No fevers. Creatinine stable this AM. CXR yesterday with new diffuse bilateral heterogeneous opacities, likely due to worsening pulmonary edema with small right-greater-than-left pleural effusions. Currently maintained on IV Lasix 20mg  BID. Pre TAVR CT scans obtained to assist with care planning.   Past Medical History:  Diagnosis Date   Acute on chronic diastolic CHF (congestive heart failure) ([place]) 07/12/2021   Allergy    seasonal allergies   Anemia 2014   needed 5 units of blood d/t passing out, weak   Anxiety    Arthritis    Asthma    allergy induced asthma   CAD (coronary artery disease)    a. 05/2018 MV: EF 82%, no ischemia/infarct; b. 07/2021 NSTEMI/Cath: LM 89m, LAD 70p/m, LCX min irregs, RCA 20ost/p. AoV mean grad of 74mmHg w/ AVA of 0.44. EF 25-35%.   Cardiac murmur 12/12/2017   Cataract    left   Complication of anesthesia    arrhythmia following colonoscopy   Degenerative disc disease, lumbar    Depression    Diabetes mellitus    Diabetic neuropathy ([place]) 11/16/2015   GERD (gastroesophageal reflux disease)    H/O transfusion    patient was given 5 units of blood while at [place], blood type O+   HFrEF (heart failure with reduced ejection fraction) ([place])    a. 05/2018 Echo: EF 60-65%; b. 07/2021 Echo: EF 30-35%.   History of chicken pox    History of hiatal hernia    History of measles, mumps, or rubella    HOH (hard of hearing)    does not use hearing aides yet   Hyperlipidemia    Hypertension    Irregular heartbeat    Ischemic cardiomyopathy    a. 05/2018 Echo: EF 60-65%; b. 07/2021 Echo: EF 30-35%. GrII DD, sev mid-apical/anteroseptal/ant/apical HK.   Ischemic colitis ([place])    LVH (left ventricular hypertrophy) 12/24/2017   Echo Aug 2018   Mesenteric ischemia ([place])    a. 12/2019 s/p PTA/stenting of SMA w/ 6x29 balloon expandable VBX stent.   Moderate mitral regurgitation    Neuropathy    NSTEMI (non-ST elevated myocardial infarction) ([place])  07/21/2021   Opiate use 11/16/2015   Peripheral arterial disease ([place])    a. 05/2018 s/p PTA/DCBA of L CFA; b. 11/2018 PTAR TP trunk and prox peroneal, DCBA R SFA & prox pop, Viabahn stenting to mid/distal R SFA; c. 05/2019 DCBA/stenting L SFA.   Pes planus of both feet 11/16/2015   Plantar fasciitis of right foot 11/16/2015   Pneumonia    Severe aortic stenosis    a. 05/2018 Echo: EF 60-65%, GrI DD, mild AS (mean grad 14mmHg, AVA 1.31cm^2); b. 07/2021 Echo: Ef 30-35%, mild LVH, GrII DD, sev mid-apical/anteroseptal/ant/apical HK. Nl RV fxn. Mildly dil LA. Mod MR. Sev AS (mean grad 34.55mmHg, AVA 0.64 cm^2).   Wheezing     Past Surgical History:  Procedure Laterality Date   ABDOMINAL HYSTERECTOMY     APPLICATION OF WOUND VAC Left 07/23/2018   Procedure: APPLICATION OF WOUND VAC;  Surgeon: Algernon Huxley, MD;  Location: [facility] ORS;  Service: Vascular;  Laterality: Left;   banck injections     CATARACT EXTRACTION W/PHACO Right 05/30/2015   Procedure: CATARACT EXTRACTION PHACO AND INTRAOCULAR LENS PLACEMENT ([place]);  Surgeon:  Birder Robson, MD;  Location: [facility] ORS;  Service: Ophthalmology;  Laterality: Right;  [place] 00:57 AP% 20.9 CDE 11.99 fluid pack lot #[id number]   CATARACT EXTRACTION W/PHACO Left 11/11/2019   Procedure: CATARACT EXTRACTION PHACO AND INTRAOCULAR LENS PLACEMENT ([place]) LEFT DIABETIC;  Surgeon: Birder Robson, MD;  Location: [facility] ORS;  Service: Ophthalmology;  Laterality: Left;  Lot [id number] [place]: 00:47.5 CDE: 6.24   CHOLECYSTECTOMY  1970   COLON SURGERY  2013   blocked colon   COLONOSCOPY N/A 12/29/2019   Procedure: COLONOSCOPY;  Surgeon: Virgel Manifold, MD;  Location: [facility] ENDOSCOPY;  Service: Endoscopy;  Laterality: N/A;   COLONOSCOPY WITH PROPOFOL N/A 01/20/2018   Procedure: COLONOSCOPY WITH PROPOFOL;  Surgeon: Lucilla Lame, MD;  Location: [facility] ENDOSCOPY;  Service: Endoscopy;  Laterality: N/A;   ENDARTERECTOMY FEMORAL Left 07/08/2018   Procedure: ENDARTERECTOMY FEMORAL;   Surgeon: Algernon Huxley, MD;  Location: [facility] ORS;  Service: Vascular;  Laterality: Left;   EYE SURGERY Right    cataract surgery   FOOT FUSION Right 2018   metal in foot   [place]  2010   lost 178 lbs and regained 40 lbs last few years   HUMERUS FRACTURE SURGERY Right    metal plate with screws   internal bleeding  2016   ulcer in past   LOWER EXTREMITY ANGIOGRAM Left 07/08/2018   Procedure: LOWER EXTREMITY ANGIOGRAM;  Surgeon: Algernon Huxley, MD;  Location: [facility] ORS;  Service: Vascular;  Laterality: Left;   LOWER EXTREMITY ANGIOGRAPHY Left 05/27/2018   Procedure: LOWER EXTREMITY ANGIOGRAPHY;  Surgeon: Algernon Huxley, MD;  Location: [place] CV LAB;  Service: Cardiovascular;  Laterality: Left;   LOWER EXTREMITY ANGIOGRAPHY Right 12/07/2018   Procedure: LOWER EXTREMITY ANGIOGRAPHY;  Surgeon: Algernon Huxley, MD;  Location: [place] CV LAB;  Service: Cardiovascular;  Laterality: Right;   LOWER EXTREMITY ANGIOGRAPHY Left 05/10/2019   Procedure: LOWER EXTREMITY ANGIOGRAPHY;  Surgeon: Algernon Huxley, MD;  Location: [place] CV LAB;  Service: Cardiovascular;  Laterality: Left;   MOUTH SURGERY     root canals and crowns and extractions   MOUTH SURGERY  09/30/2019   OVARY SURGERY     RIGHT/LEFT HEART CATH AND CORONARY ANGIOGRAPHY N/A 07/23/2021   Procedure: RIGHT/LEFT HEART CATH AND CORONARY ANGIOGRAPHY;  Surgeon: Wellington Hampshire, MD;  Location: [place] CV LAB;  Service: Cardiovascular;  Laterality: N/A;   SKIN GRAFT Right 2018   RT foot. foot has been rebuilt.  it is full of metal   TENOTOMY ACHILLES TENDON Right    Percuntaneous. metal in foot   TONSILLECTOMY     VISCERAL ANGIOGRAPHY N/A 12/31/2019   Procedure: VISCERAL ANGIOGRAPHY;  Surgeon: Algernon Huxley, MD;  Location: [place] CV LAB;  Service: Cardiovascular;  Laterality: N/A;   WOUND DEBRIDEMENT Left 07/23/2018   Procedure: DEBRIDEMENT WOUND;  Surgeon: Algernon Huxley, MD;  Location: [facility] ORS;   Service: Vascular;  Laterality: Left;   Inpatient Medications: Scheduled Meds:  [START ON 07/26/2021] aspirin EC  81 mg Oral Daily   atorvastatin  40 mg Oral Daily   furosemide  20 mg Intravenous BID   gabapentin  400 mg Oral BID   insulin aspart  0-15 Units Subcutaneous TID WC   metoprolol succinate  25 mg Oral Daily   montelukast  10 mg Oral QHS   nicotine  14 mg Transdermal Daily   sertraline  150 mg Oral Daily   umeclidinium-vilanterol  1  puff Inhalation Daily   Continuous Infusions:  heparin 1,450 Units/hr (07/04/2021 0336)   PRN Meds: acetaminophen, fluticasone, ipratropium-albuterol, ondansetron (ZOFRAN) IV  Allergies:    Allergies  Allergen Reactions   Meloxicam Other (See Comments)    GI bleeding   Nsaids Other (See Comments)    Gi bleeding   Sitagliptin Hives and Itching    Januvia     Social History:   Social History   Socioeconomic History   Marital status: Divorced    Spouse name: Not on file   Number of children: 1   Years of education: Not on file   Highest education level: Associate degree: academic program  Occupational History   Occupation: Oceanographer    Comment: [employer]  Tobacco Use   Smoking status: Some Days    Packs/day: 0.50    Years: 55.00    Pack years: 27.50    Types: Cigarettes    Start date: 07/01/1960    Last attempt to quit: 11/22/2018    Years since quitting: 2.6   Smokeless tobacco: Never   Tobacco comments:    trying to quit again; wants to use chantix but unable to afford  Vaping Use   Vaping Use: Never used  Substance and Sexual Activity   Alcohol use: Yes    Alcohol/week: 0.0 standard drinks    Comment: 2 drinks per month   Drug use: Not Currently    Types: Marijuana    Comment: used for pain   Sexual activity: Not Currently  Other Topics Concern   Not on file  Social History Narrative   Not on file   Social Determinants of Health   Financial Resource Strain: Not on file   Food Insecurity: Not on file  Transportation Needs: Not on file  Physical Activity: Not on file  Stress: Not on file  Social Connections: Not on file  Intimate Partner Violence: Not on file    Family History:    Family History  Problem Relation Age of Onset   Asthma Mother    COPD Mother    Arthritis Father    Depression Father    Heart disease Father    Hypertension Father    Stroke Father    Heart attack Father    Arthritis Brother    Depression Brother    Diabetes Brother    Heart disease Brother    Hyperlipidemia Brother    Hypertension Brother    Stroke Brother    Vision loss Brother    Heart attack Brother    Diabetes Maternal Grandmother    Thyroid disease Daughter    Colitis Daughter    Breast cancer Neg Hx     ROS:  Please see the history of present illness.   All other ROS reviewed and negative.     Physical Exam/Data:   Vitals:   07/11/2021 0831 07/28/2021 0832 07/20/2021 0833 07/29/2021 0834  BP:      Pulse: 70 70 71 66  Resp:      Temp:      TempSrc:      SpO2: 95% 95% 94% 95%  Weight:      Height:        Intake/Output Summary (Last 24 hours) at 07/29/2021 1012 Last data filed at 07/27/2021 0313 Gross per 24 hour  Intake 240 ml  Output 300 ml  Net -60 ml   Last 3 Weights 07/26/2021 07/20/2021 07/21/2021  Weight (lbs) 136 lb 0.4 oz 136  lb 0.4 oz 130 lb  Weight (kg) 61.7 kg 61.7 kg 58.968 kg     Body mass index is 25.7 kg/m.   General: Frail, ill appearing, NAD Neck: No JVD Lungs: Bilateral LL crackles. Breathing is unlabored. Cardiovascular: RRR with S1 S2. Harsh systolic murmurs heard MSB with no rubs or gallops.  Abdomen: Soft, non-tender, non-distended. No obvious abdominal masses. Extremities: No edema. Neuro: Alert and oriented. No focal deficits. No facial asymmetry. MAE spontaneously. Psych: Responds to questions appropriately with normal affect.    EKG:  The EKG was personally reviewed and demonstrates: 07/06/2021 NSR with LVH and  repolarization abnormality, HR 65bpm.   Telemetry:  Telemetry was personally reviewed and demonstrates: 07/17/2021 NSR with HRs in the 60-70's.   Relevant CV Studies:  [facility] 07/23/21:    Mid LM lesion is 95% stenosed.   Prox LAD to Mid LAD lesion is 70% stenosed.   Ost RCA to Prox RCA lesion is 20% stenosed.   There is moderate to severe left ventricular systolic dysfunction.   LV end diastolic pressure is moderately elevated.   The left ventricular ejection fraction is 25-35% by visual estimate.   1.  Critical left main stenosis with significant calcifications.  No obstructive disease involving the right coronary artery. 2.  Moderately to severely reduced LV systolic function with an EF of 30 to 35% with global hypokinesis.  Moderate to severe mitral regurgitation. 3.  Severe aortic stenosis with mean gradient of 34 mmHg and valve area of 0.44. 4.  Right heart catheterization showed moderately elevated wedge pressure, moderate pulmonary hypertension and severely reduced cardiac output.  Prominent V waves on pulmonary wedge tracing suggestive of significant mitral regurgitation.   Recommendations: Transfer to [facility] for evaluation of CABG plus AVR versus left main stenting plus TAVR depending on candidacy for surgery.  She does have underlying COPD and also seem to have poor nutrition and weight loss and thus likely a high risk surgical candidate. Resume heparin at 4:30 PM.  Proceed with echocardiogram.  Diagnostic Dominance: Right   Echocardiogram 07/23/21:   1. Left ventricular ejection fraction, by estimation, is 30 to 35%. The  left ventricle has moderately decreased function. The left ventricle  demonstrates regional wall motion abnormalities (see scoring  diagram/findings for description). There is mild  left ventricular hypertrophy. Left ventricular diastolic parameters are  consistent with Grade II diastolic dysfunction (pseudonormalization).  There is severe  hypokinesis of the left ventricular, mid-apical  anteroseptal wall, anterior wall, anterior  segment and apical segment.   2. Right ventricular systolic function is normal. The right ventricular  size is normal. There is mildly elevated pulmonary artery systolic  pressure.   3. Left atrial size was mildly dilated.   4. The mitral valve is abnormal. Moderate mitral valve regurgitation. No  evidence of mitral stenosis. Severe mitral annular calcification.   5. The aortic valve is calcified. Aortic valve regurgitation is not  visualized. Severe aortic valve stenosis. Aortic valve area, by VTI  measures 0.64 cm. Aortic valve mean gradient measures 34.2 mmHg.   Laboratory Data:  High Sensitivity Troponin:   Recent Labs  Lab 07/21/21 2138 07/21/21 2329  TROPONINIHS 3,727* 4,188*     Chemistry Recent Labs  Lab 07/23/21 0430 07/20/2021 1228 07/06/2021 0505  NA 127* 125* 128*  K 3.9 4.0 4.3  CL 94* 94* 95*  CO2 25 23 23   GLUCOSE 111* 248* 123*  BUN 24* 33* 33*  CREATININE 0.68 0.94 0.91  CALCIUM 8.6*  8.6* 8.7*  GFRNONAA >60 >60 >60  ANIONGAP 8 8 10     No results for input(s): PROT, ALBUMIN, AST, ALT, ALKPHOS, BILITOT in the last 168 hours. Hematology Recent Labs  Lab 07/23/21 0430 07/29/2021 1228 07/24/2021 0505  WBC 9.3 16.3* 10.7*  RBC 3.48* 4.00 3.23*  HGB 9.2* 10.8* 8.7*  HCT 27.5* 32.8* 26.2*  MCV 79.0* 82.0 81.1  MCH 26.4 27.0 26.9  MCHC 33.5 32.9 33.2  RDW 14.7 14.8 14.7  PLT 181 285 187   BNP Recent Labs  Lab 07/21/21 2329  BNP 2,354.7*    DDimer No results for input(s): DDIMER in the last 168 hours.   Radiology/Studies:  DG Chest 2 View  Result Date: 07/21/2021 CLINICAL DATA:  Shortness of breath. EXAM: CHEST - 2 VIEW COMPARISON:  Chest radiograph dated 09/06/2020. FINDINGS: Background of emphysema. Diffuse interstitial prominence may be chronic or represent mild edema. There is blunting of the costophrenic angles which may represent small bilateral  pleural effusions. No pneumothorax. The cardiac silhouette is within normal limits. Atherosclerotic calcification of the aorta. No acute osseous pathology. Degenerative changes of the spine. IMPRESSION: Emphysema with possible mild interstitial edema and small bilateral pleural effusions. Electronically Signed   By: Anner Crete M.D.   On: 07/21/2021 22:08   CARDIAC CATHETERIZATION  Result Date: 07/23/2021   Mid LM lesion is 95% stenosed.   Prox LAD to Mid LAD lesion is 70% stenosed.   Ost RCA to Prox RCA lesion is 20% stenosed.   There is moderate to severe left ventricular systolic dysfunction.   LV end diastolic pressure is moderately elevated.   The left ventricular ejection fraction is 25-35% by visual estimate. 1.  Critical left main stenosis with significant calcifications.  No obstructive disease involving the right coronary artery. 2.  Moderately to severely reduced LV systolic function with an EF of 30 to 35% with global hypokinesis.  Moderate to severe mitral regurgitation. 3.  Severe aortic stenosis with mean gradient of 34 mmHg and valve area of 0.44. 4.  Right heart catheterization showed moderately elevated wedge pressure, moderate pulmonary hypertension and severely reduced cardiac output.  Prominent V waves on pulmonary wedge tracing suggestive of significant mitral regurgitation. Recommendations: Transfer to [facility] for evaluation of CABG plus AVR versus left main stenting plus TAVR depending on candidacy for surgery.  She does have underlying COPD and also seem to have poor nutrition and weight loss and thus likely a high risk surgical candidate. Resume heparin at 4:30 PM.  Proceed with echocardiogram.   DG Chest Port 1 View  Result Date: 07/25/2021 CLINICAL DATA:  Respiratory distress EXAM: PORTABLE CHEST 1 VIEW COMPARISON:  Chest x-ray dated July 21, 2021 FINDINGS: Visualized cardiac and mediastinal contours are unchanged. New diffuse bilateral heterogeneous opacities.  Small right-greater-than-left pleural effusions. IMPRESSION: 1. New diffuse bilateral heterogeneous opacities, likely due to worsening pulmonary edema. 2. Small right-greater-than-left pleural effusions. Electronically Signed   By: Yetta Glassman M.D.   On: 07/25/2021 12:07   ECHOCARDIOGRAM COMPLETE  Result Date: 07/23/2021    ECHOCARDIOGRAM REPORT   Patient Name:   LOVEAH HAMIDEH [facility] Date of Exam: 07/23/2021 Medical Rec #:  [id number]          Height:       61.0 in Accession #:    [id number]         Weight:       130.0 lb Date of Birth:  1945-08-10  BSA:          1.573 m Patient Age:    19 years           BP:           94/53 mmHg Patient Gender: F                  HR:           78 bpm. Exam Location:  [facility] Procedure: 2D Echo, Cardiac Doppler and Color Doppler Indications:     NSTEMI I21.4  History:         Patient has prior history of Echocardiogram examinations, most                  recent 06/04/2018. Signs/Symptoms:Murmur; Risk                  Factors:Hypertension.  Sonographer:     Sherrie Sport Referring Phys:  [place] Diagnosing Phys: Kathlyn [place] MD  Sonographer Comments: Suboptimal apical window. IMPRESSIONS  1. Left ventricular ejection fraction, by estimation, is 30 to 35%. The left ventricle has moderately decreased function. The left ventricle demonstrates regional wall motion abnormalities (see scoring diagram/findings for description). There is mild left ventricular hypertrophy. Left ventricular diastolic parameters are consistent with Grade II diastolic dysfunction (pseudonormalization). There is severe hypokinesis of the left ventricular, mid-apical anteroseptal wall, anterior wall, anterior segment and apical segment.  2. Right ventricular systolic function is normal. The right ventricular size is normal. There is mildly elevated pulmonary artery systolic pressure.  3. Left atrial size was mildly dilated.  4. The mitral valve is abnormal. Moderate mitral valve  regurgitation. No evidence of mitral stenosis. Severe mitral annular calcification.  5. The aortic valve is calcified. Aortic valve regurgitation is not visualized. Severe aortic valve stenosis. Aortic valve area, by VTI measures 0.64 cm. Aortic valve mean gradient measures 34.2 mmHg. FINDINGS  Left Ventricle: Left ventricular ejection fraction, by estimation, is 30 to 35%. The left ventricle has moderately decreased function. The left ventricle demonstrates regional wall motion abnormalities. Severe hypokinesis of the left ventricular, mid-apical anteroseptal wall, anterior wall, anterior segment and apical segment. The left ventricular internal cavity size was normal in size. There is mild left ventricular hypertrophy. Left ventricular diastolic parameters are consistent with Grade II  diastolic dysfunction (pseudonormalization). Right Ventricle: The right ventricular size is normal. No increase in right ventricular wall thickness. Right ventricular systolic function is normal. There is mildly elevated pulmonary artery systolic pressure. The tricuspid regurgitant velocity is 3.14  m/s, and with an assumed right atrial pressure of 5 mmHg, the estimated right ventricular systolic pressure is [id number] mmHg. Left Atrium: Left atrial size was mildly dilated. Right Atrium: Right atrial size was normal in size. Pericardium: There is no evidence of pericardial effusion. Mitral Valve: The mitral valve is abnormal. There is moderate thickening of the mitral valve leaflet(s). There is moderate calcification of the mitral valve leaflet(s). Severe mitral annular calcification. Moderate mitral valve regurgitation. No evidence  of mitral valve stenosis. MV peak gradient, 8.5 mmHg. The mean mitral valve gradient is 3.0 mmHg. Tricuspid Valve: The tricuspid valve is normal in structure. Tricuspid valve regurgitation is mild . No evidence of tricuspid stenosis. Aortic Valve: The aortic valve is calcified. Aortic valve regurgitation is  not visualized. Severe aortic stenosis is present. Aortic valve mean gradient measures 34.2 mmHg. Aortic valve peak gradient measures 51.2 mmHg. Aortic valve area, by VTI measures  0.64 cm. Pulmonic  Valve: The pulmonic valve was normal in structure. Pulmonic valve regurgitation is not visualized. No evidence of pulmonic stenosis. Aorta: The aortic root is normal in size and structure. Venous: The inferior vena cava was not well visualized. IAS/Shunts: No atrial level shunt detected by color flow Doppler.  LEFT VENTRICLE PLAX 2D LVIDd:         5.20 cm   Diastology LVIDs:         3.80 cm   LV e' medial:    4.24 cm/s LV PW:         1.00 cm   LV E/e' medial:  26.9 LV IVS:        0.95 cm   LV e' lateral:   3.81 cm/s LVOT diam:     2.00 cm   LV E/e' lateral: 29.9 LV SV:         56 LV SV Index:   35 LVOT Area:     3.14 cm  RIGHT VENTRICLE RV Basal diam:  4.35 cm RV S prime:     14.30 cm/s TAPSE (M-mode): 3.0 cm LEFT ATRIUM             Index        RIGHT ATRIUM           Index LA diam:        4.70 cm 2.99 cm/m   RA Area:     14.30 cm LA Vol (A2C):   75.6 ml 48.07 ml/m  RA Volume:   35.80 ml  22.76 ml/m LA Vol (A4C):   50.4 ml 32.05 ml/m LA Biplane Vol: 67.4 ml 42.85 ml/m  AORTIC VALVE                     PULMONIC VALVE AV Area (Vmax):    0.57 cm      PV Vmax:        0.92 m/s AV Area (Vmean):   0.53 cm      PV Vmean:       61.800 cm/s AV Area (VTI):     0.64 cm      PV VTI:         0.171 m AV Vmax:           357.75 cm/s   PV Peak grad:   3.4 mmHg AV Vmean:          281.000 cm/s  PV Mean grad:   2.0 mmHg AV VTI:            0.866 m       RVOT Peak grad: 4 mmHg AV Peak Grad:      51.2 mmHg AV Mean Grad:      34.2 mmHg LVOT Vmax:         64.60 cm/s LVOT Vmean:        47.800 cm/s LVOT VTI:          0.177 m LVOT/AV VTI ratio: 0.20  AORTA Ao Root diam: 3.00 cm MITRAL VALVE                TRICUSPID VALVE MV Area (PHT): 3.45 cm     TR Peak grad:   39.4 mmHg MV Area VTI:   1.36 cm     TR Vmax:        314.00 cm/s MV Peak  grad:  8.5 mmHg MV Mean grad:  3.0 mmHg     SHUNTS MV Vmax:       1.46 m/s  Systemic VTI:  0.18 m MV Vmean:      78.6 cm/s    Systemic Diam: 2.00 cm MV Decel Time: 220 msec     Pulmonic VTI:  0.192 m MV E velocity: 114.00 cm/s MV A velocity: 96.40 cm/s MV E/A ratio:  1.18 Kathlyn Sacramento MD Electronically signed by Kathlyn Sacramento MD Signature Date/Time: 07/23/2021/2:01:59 PM    Final     STS Risk Calculator: Procedure: AV Replacement  Risk of Mortality: 4.703% Renal Failure: 2.768% Permanent Stroke: 2.445% Prolonged Ventilation: 17.598% DSW Infection: 0.210% Reoperation: 3.944% Morbidity or Mortality: 25.135% Short Length of Stay: 18.648% Long Length of Stay: 12.820%  [facility] Cardiomyopathy Questionnaire  KCCQ-12 07/06/2021  1 a. Ability to shower/bathe Moderately limited  1 b. Ability to walk 1 block Other, Did not do  1 c. Ability to hurry/jog Other, Did not do  2. Edema feet/ankles/legs Never over the past 2 weeks  3. Limited by fatigue All of the time  4. Limited by dyspnea All of the time  5. Sitting up / on 3+ pillows Less than once a week  6. Limited enjoyment of life Limited quite a bit  7. Rest of life w/ symptoms Mostly dissatisfied  8 a. Participation in hobbies Limited quite a bit  8 b. Participation in chores Limited quite a bit  8 c. Visiting family/friends Moderately limited    Assessment and Plan:   LATRISTA SMOLENSKI is a 76 y.o. female with symptoms of severe, stage D1/D2 aortic stenosis with NYHA Class III symptoms. I have reviewed the patient's recent echocardiogram which is notable for decreased LV systolic function at 99991111 and severe aortic stenosis with peak gradient of 51.52mmHg and mean transvalvular gradient of 34.98mmhg. The patient's dimensionless index is 0.20 and calculated aortic valve area is 0.64cm.    I have reviewed the natural history of aortic stenosis with the patient. We have discussed the limitations of medical therapy and the  poor prognosis associated with symptomatic aortic stenosis. We have reviewed potential treatment options, including palliative medical therapy, conventional surgical aortic valve replacement, and transcatheter aortic valve replacement. We discussed treatment options in the context of this patient's specific comorbid medical conditions.    The patient's predicted risk of mortality with conventional aortic valve replacement is 4.7% primarily based on severe 95% left main CAD, new cardiomyopathy with an EF at 30-35%, and severe COPD with ongoing tobacco use. Other significant comorbid conditions include significant PAD with extensive lower extremity stenting and calcifications. Given that she will likely be a poor conventional candidate, TAVR may be a reasonable treatment option for this patient pending formal cardiac surgical consultation along with a gated cardiac CTA and a CTA of the chest/abdomen/pelvis to evaluate both his cardiac anatomy and peripheral vasculature. If not a conventional candidate, she will need to undergo high risk LM PCI during this admission prior to OP TAVR.  ADDEND: Patient developed recurrent chest pain rated 8/10 in severity. Seen by AHF team with plans for urgent IABP/ swan placement. Dr. Cyndia Bent to follow for TAVR consultation.    For questions or updates, please contact [place] Please consult [email or web address] for contact info under    Signed, Kathyrn Drown, NP  07/08/2021 10:12 AM    Chart reviewed, patient examined, agree with above. This 76 year old vasculopath with DM, COPD with ongoing smoking, extreme wt loss and poor mobility has severe aortic stenosis, moderate to severe MR with a calcified valve and severe LV systolic dysfunction with an EF  of 30-35% presented with NSTEMI. Cath shows critical calcified LM stenosis of 95%, calcified proximal 70% LAD. Today she developed severe chest pain and was taken to cath lab by AHF team for IABP and swan insertion. After IABP  insertion she became unresponsive and code stroke was called. She was intubated for airway protection. CT brain showed no acute findings with severe bilateral vertebral artery origin stenoses. CXR shows pulmonary edema and bilateral pleural effusions. She woke up from this but remains intubated and vent, hemodynamically stable with IABP in place. She is not a candidate for open surgical AVR and CABG due to her co-morbidities, ongoing smoking, diffuse vascular disease and baseline reduced mobility, poor nutrition. TAVR could be entertained if she made a good recovery from this event today but she would require a very complex LM PCI and I think she is not a good candidate for that with her diffuse vascular disease and limited support options. Her gated cardiac CTA shows anatomy suitable for TAVR using a Sapien 3 valve but her abdominal and pelvic CTA shows significant calcified aorto-iliac vascular disease and I doubt that transfemoral access for TAVR can be done safely. I don't know about her dental status at this time. It may be that palliative care is the best option for this critically ill pt with no good treatment options.  Gaye Pollack, MD

## 2021-07-25 NOTE — Plan of Care (Signed)
  Problem: Activity: Goal: Risk for activity intolerance will decrease Outcome: Progressing   Problem: Nutrition: Goal: Adequate nutrition will be maintained Outcome: Progressing   Problem: Elimination: Goal: Will not experience complications related to bowel motility Outcome: Progressing   Problem: Pain Managment: Goal: General experience of comfort will improve Outcome: Progressing   

## 2021-07-25 NOTE — Progress Notes (Signed)
EEG complete - results pending 

## 2021-07-25 NOTE — Progress Notes (Addendum)
ANTICOAGULATION CONSULT NOTE - Initial Consult  Pharmacy Consult for heparin Indication:  CAD awaiting CABG consult  Allergies  Allergen Reactions   Meloxicam Other (See Comments)    GI bleeding   Nsaids Other (See Comments)    Gi bleeding   Sitagliptin Hives and Itching    Januvia     Patient Measurements: Height: 5\' 1"  (154.9 cm) Weight: 61.7 kg (136 lb 0.4 oz) IBW/kg (Calculated) : 47.8 Heparin Dosing Weight: 60kg  Vital Signs: Temp: 97.7 F (36.5 C) (01/24 2351) Temp Source: Oral (01/24 2351) BP: 92/50 (01/24 2351) Pulse Rate: 69 (01/24 2351)  Labs: Recent Labs    07/22/21 0439 07/22/21 0729 07/23/21 0430 07/07/2021 0058 07/21/2021 1228 07/16/2021 2017  HGB 9.7*  --  9.2*  --  10.8*  --   HCT 28.1*  --  27.5*  --  32.8*  --   PLT 194  --  181  --  285  --   LABPROT 14.2  --   --   --   --   --   INR 1.1  --   --   --   --   --   HEPARINUNFRC  --    < > 0.15* 0.24* 0.42 0.42  CREATININE 0.56  --  0.68  --  0.94  --    < > = values in this interval not displayed.    Estimated Creatinine Clearance: 44.3 mL/min (by C-G formula based on SCr of 0.94 mg/dL).   Medical History: Past Medical History:  Diagnosis Date   Acute on chronic diastolic CHF (congestive heart failure) ([place]) 07/23/2021   Allergy    seasonal allergies   Anemia 2014   needed 5 units of blood d/t passing out, weak   Anxiety    Arthritis    Asthma    allergy induced asthma   CAD (coronary artery disease)    a. 05/2018 MV: EF 82%, no ischemia/infarct; b. 07/2021 NSTEMI/Cath: LM 54m, LAD 70p/m, LCX min irregs, RCA 20ost/p. AoV mean grad of 41mmHg w/ AVA of 0.44. EF 25-35%.   Cardiac murmur 12/12/2017   Cataract    left   Complication of anesthesia    arrhythmia following colonoscopy   Degenerative disc disease, lumbar    Depression    Diabetes mellitus    Diabetic neuropathy ([place]) 11/16/2015   GERD (gastroesophageal reflux disease)    H/O transfusion    patient was given 5 units of blood  while at [place], blood type O+   HFrEF (heart failure with reduced ejection fraction) ([place])    a. 05/2018 Echo: EF 60-65%; b. 07/2021 Echo: EF 30-35%.   History of chicken pox    History of hiatal hernia    History of measles, mumps, or rubella    HOH (hard of hearing)    does not use hearing aides yet   Hyperlipidemia    Hypertension    Irregular heartbeat    Ischemic cardiomyopathy    a. 05/2018 Echo: EF 60-65%; b. 07/2021 Echo: EF 30-35%. GrII DD, sev mid-apical/anteroseptal/ant/apical HK.   Ischemic colitis ([place])    LVH (left ventricular hypertrophy) 12/24/2017   Echo Aug 2018   Mesenteric ischemia ([place])    a. 12/2019 s/p PTA/stenting of SMA w/ 6x29 balloon expandable VBX stent.   Moderate mitral regurgitation    Neuropathy    NSTEMI (non-ST elevated myocardial infarction) ([place]) 07/21/2021   Opiate use 11/16/2015   Peripheral arterial disease ([place])    a. 05/2018  s/p PTA/DCBA of L CFA; b. 11/2018 PTAR TP trunk and prox peroneal, DCBA R SFA & prox pop, Viabahn stenting to mid/distal R SFA; c. 05/2019 DCBA/stenting L SFA.   Pes planus of both feet 11/16/2015   Plantar fasciitis of right foot 11/16/2015   Pneumonia    Severe aortic stenosis    a. 05/2018 Echo: EF 60-65%, GrI DD, mild AS (mean grad 29mmHg, AVA 1.31cm^2); b. 07/2021 Echo: Ef 30-35%, mild LVH, GrII DD, sev mid-apical/anteroseptal/ant/apical HK. Nl RV fxn. Mildly dil LA. Mod MR. Sev AS (mean grad 34.53mmHg, AVA 0.64 cm^2).   Wheezing     Assessment: 76yo female was admitted to [facility] for NSTEMI, tx'd to [facility] after cath for further w/u by CVTS, to continue heparin; levels have been at goal at 1450 units/hr, pt arrived from [facility] with heparin still running.  Goal of Therapy:  Heparin level 0.3-0.7 units/ml Monitor platelets by anticoagulation protocol: Yes   Plan:  Continue heparin infusion at 1450 units/hr and monitor heparin levels and CBC.  Andrea Holden, PharmD, BCPS  07/27/2021,3:11 AM   Addendum: Heparin level  this am (0.22) below goal but upon further d/w RN heparin infusion had been interrupted for a few hours after transfer.  Will continue at 1450 units/hr and recheck level ~8h after resuming.  VB 6:36 AM

## 2021-07-25 NOTE — Anesthesia Procedure Notes (Signed)
Procedure Name: Intubation Date/Time: 07/03/2021 2:05 PM Performed by: Imagene Riches, CRNA Pre-anesthesia Checklist: Patient identified, Emergency Drugs available, Suction available and Patient being monitored Patient Re-evaluated:Patient Re-evaluated prior to induction Oxygen Delivery Method: Circle System Utilized Preoxygenation: Pre-oxygenation with 100% oxygen Induction Type: IV induction and Rapid sequence Ventilation: Mask ventilation without difficulty Laryngoscope Size: Mac and 3 Grade View: Grade II Tube type: Oral Tube size: 7.0 mm Number of attempts: 1 Airway Equipment and Method: Stylet and Oral airway Placement Confirmation: ETT inserted through vocal cords under direct vision, positive ETCO2 and breath sounds checked- equal and bilateral Secured at: 22 cm Tube secured with: Tape Dental Injury: Teeth and Oropharynx as per pre-operative assessment

## 2021-07-25 NOTE — Progress Notes (Signed)
ANTICOAGULATION CONSULT NOTE  Pharmacy Consult for heparin Indication:  CAD awaiting CABG consult  Allergies  Allergen Reactions   Meloxicam Other (See Comments)    GI bleeding   Nsaids Other (See Comments)    Gi bleeding   Sitagliptin Hives and Itching    Januvia     Patient Measurements: Height: 5\' 1"  (154.9 cm) Weight: 61.7 kg (136 lb 0.4 oz) IBW/kg (Calculated) : 47.8 Heparin Dosing Weight: 60 kg  Vital Signs: Temp: 96.3 F (35.7 C) (01/25 1645) Temp Source: Oral (01/25 0736) BP: 141/94 (01/25 1645) Pulse Rate: 87 (01/25 1645)  Labs: Recent Labs    07/23/21 0430 15-Aug-2021 0058 2021/08/15 1228 15-Aug-2021 2017 07/05/2021 0505 07/12/2021 1100 07/22/2021 1103 07/16/2021 1523 07/08/2021 1641  HGB 9.2*  --  10.8*  --  8.7*  --   --   --  12.9  HCT 27.5*  --  32.8*  --  26.2*  --   --   --  38.0  PLT 181  --  285  --  187  --   --   --   --   HEPARINUNFRC 0.15*   < > 0.42 0.42 0.22* 0.51  --   --   --   CREATININE 0.68  --  0.94  --  0.91  --   --  1.01*  --   TROPONINIHS  --   --   --   --   --   --  1,468* 1,949*  --    < > = values in this interval not displayed.     Estimated Creatinine Clearance: 41.2 mL/min (A) (by C-G formula based on SCr of 1.01 mg/dL (H)).   Medical History: Past Medical History:  Diagnosis Date   Acute on chronic diastolic CHF (congestive heart failure) ([facility]) 08-15-2021   Allergy    seasonal allergies   Anemia 2014   needed 5 units of blood d/t passing out, weak   Anxiety    Arthritis    Asthma    allergy induced asthma   CAD (coronary artery disease)    a. 05/2018 MV: EF 82%, no ischemia/infarct; b. 07/2021 NSTEMI/Cath: LM 48m, LAD 70p/m, LCX min irregs, RCA 20ost/p. AoV mean grad of w/ AVA of 0.44. EF 25-35%.   Cardiac murmur 12/12/2017   Cataract    left   Complication of anesthesia    arrhythmia following colonoscopy   Degenerative disc disease, lumbar    Depression    Diabetes mellitus    Diabetic neuropathy ([facility])  11/16/2015   GERD (gastroesophageal reflux disease)    H/O transfusion    patient was given 5 units of blood while at [facility], blood type O+   HFrEF (heart failure with reduced ejection fraction) ([facility])    a. 05/2018 Echo: EF 60-65%; b. 07/2021 Echo: EF 30-35%.   History of chicken pox    History of hiatal hernia    History of measles, mumps, or rubella    HOH (hard of hearing)    does not use hearing aides yet   Hyperlipidemia    Hypertension    Irregular heartbeat    Ischemic cardiomyopathy    a. 05/2018 Echo: EF 60-65%; b. 07/2021 Echo: EF 30-35%. GrII DD, sev mid-apical/anteroseptal/ant/apical HK.   Ischemic colitis ([facility])    LVH (left ventricular hypertrophy) 12/24/2017   Echo Aug 2018   Mesenteric ischemia ([facility])    a. 12/2019 s/p PTA/stenting of SMA w/ 6x29 balloon expandable VBX stent.  Moderate mitral regurgitation    Neuropathy    NSTEMI (non-ST elevated myocardial infarction) ([place]) 07/21/2021   Opiate use 11/16/2015   Peripheral arterial disease ([place])    a. 05/2018 s/p PTA/DCBA of L CFA; b. 11/2018 PTAR TP trunk and prox peroneal, DCBA R SFA & prox pop, Viabahn stenting to mid/distal R SFA; c. 05/2019 DCBA/stenting L SFA.   Pes planus of both feet 11/16/2015   Plantar fasciitis of right foot 11/16/2015   Pneumonia    Severe aortic stenosis    a. 05/2018 Echo: EF 60-65%, GrI DD, mild AS (mean grad 79mmHg, AVA 1.31cm^2); b. 07/2021 Echo: Ef 30-35%, mild LVH, GrII DD, sev mid-apical/anteroseptal/ant/apical HK. Nl RV fxn. Mildly dil LA. Mod MR. Sev AS (mean grad 34.38mmHg, AVA 0.64 cm^2).   Wheezing     Assessment: 76yo female was admitted to [facility] for NSTEMI, started on heparin and tx'd to [facility] after cath for further w/u by CVTS. IABP placed today in attempt to stabilize patient to allow CVTS evaluation. Code stroke was called in cath, and heparin infusion was stopped pending Neurology evaluation. Per discussion with Dr. Rory Percy, no stroke seen on CT and ok to resume heparin.  Discussed with Dr. Haroldine Laws - will resume heparin dosing per IABP protocol. Patient is not on anticoagulation PTA.  CBC stable WNL. Noted rising SCr - up to 1.01 today. No bleed issues reported.  Goal of Therapy:  Heparin level 0.2-0.5 units/ml Monitor platelets by anticoagulation protocol: Yes   Plan:  No bolus. Resume heparin infusion at 700 units/hr Check 8hr heparin level Monitor daily CBC, s/sx bleeding F/u CVTS evaluation   Arturo Morton, PharmD, BCPS Please check AMION for all [place] contact numbers Clinical Pharmacist 07/18/2021 6:42 PM

## 2021-07-25 NOTE — Progress Notes (Signed)
°  Patient developed Code Stroke after cath.   Head CT and CTA no evidence of CVA or LVO.   She remains on the vent on low-dose propofol Per nurse, patient has been alert and following commands.   Suspect she may have had transient ischemia +/- hypercarbia due to high-flow O2 in cath lab.  Long talk with family about options and prognosis.   Given that she has resting angina with critical LM and ostial LCx disease combined with severe AS, medical therapy unlikely to be an option thus leaving high-risk PCI/TAVR with possible need for bridge BAV and Impella support vs comfort care.  They will discuss further. For now, we have decided to continue supportive care but not to escalate in case of an arrest. I have changed the chart to reflect DNR with no CPR, shock or code meds.   Additional CCT 40 mins.   Glori Bickers, MD  10:36 PM

## 2021-07-25 NOTE — Progress Notes (Signed)
°  Transition of Care (TOC) Screening Note   Patient Details  Name: CUMI SANAGUSTIN Date of Birth: [birth year]   Transition of Care [facility]) CM/SW Contact:    Eduard Roux, LCSW Phone Number: 07/29/2021, 8:59 AM    Transition of Care Department [facility]) has reviewed patient and no TOC needs have been identified at this time. We will continue to monitor patient advancement through interdisciplinary progression rounds. If new patient transition needs arise, please place a TOC consult.

## 2021-07-25 NOTE — Procedures (Signed)
Patient Name: Andrea Holden  MRN: [id number]  Epilepsy Attending: Lora Havens  Referring Physician/Provider: Janine Ores, NP Date: 07/10/2021 Duration: 22.06 mins  Patient history: 23 old female with altered mental status.  EEG to evaluate for seizure.  Level of alertness: lethargic   AEDs during EEG study: GBP, propofol  Technical aspects: This EEG study was done with scalp electrodes positioned according to the 10-20 International system of electrode placement. Electrical activity was acquired at a sampling rate of 500Hz  and reviewed with a high frequency filter of 70Hz  and a low frequency filter of 1Hz . EEG data were recorded continuously and digitally stored.   Description: EEG showed continuous generalized low amplitude 3 to 6 Hz theta-delta slowing admixed with 15 to 18 Hz generalized beta activity. Hyperventilation and photic stimulation were not performed.     ABNORMALITY - Continuous slow, generalized  IMPRESSION: This study is suggestive of moderate to severe diffuse encephalopathy, nonspecific etiology but likely related to sedation. No seizures or epileptiform discharges were seen throughout the recording.  [name]

## 2021-07-26 ENCOUNTER — Inpatient Hospital Stay (HOSPITAL_COMMUNITY): Payer: Medicare Other

## 2021-07-26 ENCOUNTER — Encounter (HOSPITAL_COMMUNITY): Payer: Self-pay | Admitting: Internal Medicine

## 2021-07-26 DIAGNOSIS — Z9911 Dependence on respirator [ventilator] status: Secondary | ICD-10-CM

## 2021-07-26 DIAGNOSIS — R570 Cardiogenic shock: Secondary | ICD-10-CM

## 2021-07-26 DIAGNOSIS — Z7189 Other specified counseling: Secondary | ICD-10-CM

## 2021-07-26 DIAGNOSIS — J9601 Acute respiratory failure with hypoxia: Secondary | ICD-10-CM

## 2021-07-26 LAB — VITAMIN D 25 HYDROXY (VIT D DEFICIENCY, FRACTURES): Vit D, 25-Hydroxy: 42.66 ng/mL (ref 30–100)

## 2021-07-26 LAB — HEPATIC FUNCTION PANEL
ALT: 34 U/L (ref 0–44)
AST: 49 U/L — ABNORMAL HIGH (ref 15–41)
Albumin: 2.8 g/dL — ABNORMAL LOW (ref 3.5–5.0)
Alkaline Phosphatase: 80 U/L (ref 38–126)
Bilirubin, Direct: 0.2 mg/dL (ref 0.0–0.2)
Indirect Bilirubin: 0.4 mg/dL (ref 0.3–0.9)
Total Bilirubin: 0.6 mg/dL (ref 0.3–1.2)
Total Protein: 5.5 g/dL — ABNORMAL LOW (ref 6.5–8.1)

## 2021-07-26 LAB — CBC
HCT: 26.4 % — ABNORMAL LOW (ref 36.0–46.0)
Hemoglobin: 8.7 g/dL — ABNORMAL LOW (ref 12.0–15.0)
MCH: 26.9 pg (ref 26.0–34.0)
MCHC: 33 g/dL (ref 30.0–36.0)
MCV: 81.5 fL (ref 80.0–100.0)
Platelets: 236 10*3/uL (ref 150–400)
RBC: 3.24 MIL/uL — ABNORMAL LOW (ref 3.87–5.11)
RDW: 14.9 % (ref 11.5–15.5)
WBC: 12.3 10*3/uL — ABNORMAL HIGH (ref 4.0–10.5)
nRBC: 0 % (ref 0.0–0.2)

## 2021-07-26 LAB — BASIC METABOLIC PANEL
Anion gap: 9 (ref 5–15)
BUN: 36 mg/dL — ABNORMAL HIGH (ref 8–23)
CO2: 25 mmol/L (ref 22–32)
Calcium: 8.8 mg/dL — ABNORMAL LOW (ref 8.9–10.3)
Chloride: 98 mmol/L (ref 98–111)
Creatinine, Ser: 0.98 mg/dL (ref 0.44–1.00)
GFR, Estimated: >60 mL/min (ref >60)
Glucose, Bld: 113 mg/dL — ABNORMAL HIGH (ref 70–99)
Potassium: 4 mmol/L (ref 3.5–5.1)
Sodium: 132 mmol/L — ABNORMAL LOW (ref 135–145)

## 2021-07-26 LAB — GLUCOSE, CAPILLARY
Glucose-Capillary: 116 mg/dL — ABNORMAL HIGH (ref 70–99)
Glucose-Capillary: 99 mg/dL (ref 70–99)
Glucose-Capillary: 114 mg/dL — ABNORMAL HIGH (ref 70–99)
Glucose-Capillary: 151 mg/dL — ABNORMAL HIGH (ref 70–99)
Glucose-Capillary: 140 mg/dL — ABNORMAL HIGH (ref 70–99)

## 2021-07-26 LAB — HEPARIN LEVEL (UNFRACTIONATED)
Heparin Unfractionated: <0.1 IU/mL — ABNORMAL LOW (ref 0.30–0.70)
Heparin Unfractionated: <0.1 IU/mL — ABNORMAL LOW (ref 0.30–0.70)
Heparin Unfractionated: 0.12 IU/mL — ABNORMAL LOW (ref 0.30–0.70)

## 2021-07-26 LAB — TRIGLYCERIDES: Triglycerides: 85 mg/dL (ref <150)

## 2021-07-26 LAB — PHOSPHORUS: Phosphorus: 5.5 mg/dL — ABNORMAL HIGH (ref 2.5–4.6)

## 2021-07-26 LAB — MAGNESIUM: Magnesium: 2.1 mg/dL (ref 1.7–2.4)

## 2021-07-26 LAB — VITAMIN B12: Vitamin B-12: 464 pg/mL (ref 180–914)

## 2021-07-26 MED ORDER — THIAMINE HCL 100 MG PO TABS
100.0000 mg | ORAL_TABLET | Freq: Every day | ORAL | Status: DC
  Administered 2021-07-26 – 2021-07-27 (×2): 100 mg
  Filled 2021-07-26 (×2): qty 1

## 2021-07-26 MED ORDER — DEXMEDETOMIDINE HCL IN NACL 400 MCG/100ML IV SOLN
0.0000 ug/kg/h | INTRAVENOUS | Status: DC
  Administered 2021-07-26: 0.3 ug/kg/h via INTRAVENOUS
  Administered 2021-07-26: 0.4 ug/kg/h via INTRAVENOUS
  Filled 2021-07-26 (×2): qty 100

## 2021-07-26 MED ORDER — POTASSIUM CHLORIDE 20 MEQ PO PACK
40.0000 meq | PACK | Freq: Once | ORAL | Status: AC
  Administered 2021-07-26: 40 meq
  Filled 2021-07-26: qty 2

## 2021-07-26 MED ORDER — FUROSEMIDE 10 MG/ML IJ SOLN
80.0000 mg | Freq: Two times a day (BID) | INTRAMUSCULAR | Status: AC
  Administered 2021-07-26 – 2021-07-27 (×3): 80 mg via INTRAVENOUS
  Filled 2021-07-26 (×3): qty 8

## 2021-07-26 MED ORDER — ADULT MULTIVITAMIN W/MINERALS CH
1.0000 | ORAL_TABLET | Freq: Two times a day (BID) | ORAL | Status: DC
  Administered 2021-07-27 – 2021-07-28 (×3): 1
  Filled 2021-07-26 (×3): qty 1

## 2021-07-26 MED ORDER — ATORVASTATIN CALCIUM 40 MG PO TABS
40.0000 mg | ORAL_TABLET | Freq: Every day | ORAL | Status: DC
  Administered 2021-07-26 – 2021-07-27 (×2): 40 mg
  Filled 2021-07-26 (×2): qty 1

## 2021-07-26 MED ORDER — ACETAMINOPHEN 160 MG/5ML PO SOLN
650.0000 mg | ORAL | Status: DC | PRN
  Administered 2021-07-26: 650 mg
  Filled 2021-07-26: qty 20.3

## 2021-07-26 MED ORDER — ASPIRIN 81 MG PO CHEW
81.0000 mg | CHEWABLE_TABLET | Freq: Every day | ORAL | Status: DC
  Administered 2021-07-26 – 2021-07-27 (×2): 81 mg
  Filled 2021-07-26 (×2): qty 1

## 2021-07-26 MED ORDER — FUROSEMIDE 10 MG/ML IJ SOLN
40.0000 mg | Freq: Two times a day (BID) | INTRAMUSCULAR | Status: DC
  Filled 2021-07-26: qty 4

## 2021-07-26 NOTE — TOC CM/SW Note (Signed)
.. °  Transition of Care (TOC) Screening Note   Patient Details  Name: PRINCESS KARNES Date of Birth: [birth year]   Transition of Care [facility]) CM/SW Contact:    Elliot Cousin, RN Phone Number: 07/26/2021, 3:18 PM    Transition of Care Department [facility]) has reviewed patient and no TOC needs have been identified at this time. We will continue to monitor patient advancement through interdisciplinary progression rounds. If new patient transition needs arise, please place a TOC consult. Patient will need follow up post dc for [facility] vs SNF, will PT/OT evaluation once medically stable. Isidoro Donning RN3 CCM, Heart Failure TOC CM [phone]

## 2021-07-26 NOTE — Progress Notes (Signed)
Neurology Progress Note   S:// Seen and examined. Remains intubated but is awake alert writing on a clipboard and responding to commands appropriately.  O:// Current vital signs: BP 140/64    Pulse (!) 221    Temp 99.5 F (37.5 C)    Resp (!) 22    Ht '5\' 1"'  (1.549 m)    Wt 60.2 kg    SpO2 97%    BMI 25.08 kg/m  Vital signs in last 24 hours: Temp:  [95.9 F (35.5 C)-99.5 F (37.5 C)] 99.5 F (37.5 C) (01/26 0900) Pulse Rate:  [63-280] 221 (01/26 0900) Resp:  [10-91] 22 (01/26 0900) BP: (84-189)/(39-134) 140/64 (01/26 0900) SpO2:  [90 %-100 %] 97 % (01/26 0900) Arterial Line BP: (86-119)/(33-69) 100/35 (01/26 0230) FiO2 (%):  [40 %-100 %] 40 % (01/26 0800) Weight:  [60.2 kg-62.7 kg] 60.2 kg (01/26 0600) General: Intubated on no sedation HEENT: Normocephalic/atraumatic CVs: Regular rhythm Respiratory: Vented Abdomen nondistended nontender Neurological exam She is awake alert oriented x3-she was able to write on the clipboard and answer questions appropriately Unable to assess dysarthria due to the ET tube. Cranial nerves II to XII grossly intact Motor exam with no drift in any fours Sensation intact to light touch all over Coordination with no obvious dysmetria  Medications  Current Facility-Administered Medications:    0.9 %  sodium chloride infusion, 250 mL, Intravenous, Continuous, Einar Grad, RPH   acetaminophen (TYLENOL) 160 MG/5ML solution 650 mg, 650 mg, Per Tube, Q4H PRN, Janina Mayo, MD   aspirin chewable tablet 81 mg, 81 mg, Per Tube, Daily, Branch, Royetta Crochet, MD, 81 mg at 07/26/21 1004   atorvastatin (LIPITOR) tablet 40 mg, 40 mg, Per Tube, Daily, Branch, Royetta Crochet, MD, 40 mg at 07/26/21 1004   chlorhexidine gluconate (MEDLINE KIT) (PERIDEX) 0.12 % solution 15 mL, 15 mL, Mouth Rinse, BID, McQuaid, Douglas B, MD, 15 mL at 07/26/21 0800   Chlorhexidine Gluconate Cloth 2 % PADS 6 each, 6 each, Topical, Daily, Simonne Maffucci B, MD, 6 each at 07/19/2021 1545    dexmedetomidine (PRECEDEX) 400 MCG/100ML (4 mcg/mL) infusion, 0.4-1.2 mcg/kg/hr, Intravenous, Titrated, Clark, Laura P, DO   docusate (COLACE) 50 MG/5ML liquid 100 mg, 100 mg, Per Tube, BID, Noemi Chapel P, DO, 100 mg at 07/26/21 1004   fentaNYL (SUBLIMAZE) injection 25 mcg, 25 mcg, Intravenous, Q15 min PRN, Noemi Chapel P, DO   fentaNYL (SUBLIMAZE) injection 25-50 mcg, 25-50 mcg, Intravenous, Q30 min PRN, Estill Cotta, NP, 50 mcg at 07/20/2021 2314   furosemide (LASIX) injection 80 mg, 80 mg, Intravenous, BID, Bensimhon, Shaune Pascal, MD   heparin ADULT infusion 100 units/mL (25000 units/264m), 900 Units/hr, Intravenous, Continuous, LErenest Blank RPH, Last Rate: 9 mL/hr at 07/26/21 0900, 900 Units/hr at 07/26/21 0900   insulin aspart (novoLOG) injection 1-3 Units, 1-3 Units, Subcutaneous, Q4H, CJulian Hy DO, 1 Units at 07/26/21 0017   ipratropium-albuterol (DUONEB) 0.5-2.5 (3) MG/3ML nebulizer solution 3 mL, 3 mL, Nebulization, Q6H PRN, SRosita Fire Brittainy M, PA-C, 3 mL at 07/05/2021 1026   ipratropium-albuterol (DUONEB) 0.5-2.5 (3) MG/3ML nebulizer solution 3 mL, 3 mL, Nebulization, Q4H, Clark, Laura P, DO, 3 mL at 07/26/21 0739   MEDLINE mouth rinse, 15 mL, Mouth Rinse, 10 times per day, MSimonne MaffucciB, MD, 15 mL at 07/26/21 1005   midazolam (VERSED) injection 1 mg, 1 mg, Intravenous, Q15 min PRN, CNoemi ChapelP, DO   midazolam (VERSED) injection 1 mg, 1 mg, Intravenous, Q2H PRN, CCarlis Abbott  Venita Sheffield, DO, 1 mg at 07/14/2021 1519   montelukast (SINGULAIR) tablet 10 mg, 10 mg, Per Tube, QHS, Simmons, Brittainy M, PA-C, 10 mg at 07/26/21 0019   nitroGLYCERIN 50 mg in dextrose 5 % 250 mL (0.2 mg/mL) infusion, 5 mcg/min, Intravenous, Titrated, Bensimhon, Shaune Pascal, MD, Last Rate: 1.5 mL/hr at 07/05/2021 1216, 5 mcg/min at 07/07/2021 1216   norepinephrine (LEVOPHED) 6m in 2515m(0.016 mg/mL) premix infusion, 2-10 mcg/min, Intravenous, Titrated, BiEinar GradRPH, Stopped at 07/26/21 0701    ondansetron (ZOFRAN) injection 4 mg, 4 mg, Intravenous, Q6H PRN, SiLyda Jester, PA-C   pantoprazole (PROTONIX) injection 40 mg, 40 mg, Intravenous, Daily, ClNoemi Chapel, DO, 40 mg at 07/26/21 1004   polyethylene glycol (MIRALAX / GLYCOLAX) packet 17 g, 17 g, Per Tube, Daily, ClNoemi Chapel, DO, 17 g at 07/26/21 1004   propofol (DIPRIVAN) 1000 MG/100ML infusion, 5-80 mcg/kg/min, Intravenous, Titrated, Shafer, DeMarcelino ScotNP, Stopped at 07/26/21 0640   sodium chloride flush (NS) 0.9 % injection 3 mL, 3 mL, Intravenous, Q12H, SiConsuelo PandyPA-C, 3 mL at 07/26/21 1005 Labs CBC    Component Value Date/Time   WBC 12.3 (H) 07/26/2021 0437   RBC 3.24 (L) 07/26/2021 0437   HGB 8.7 (L) 07/26/2021 0437   HGB 12.9 11/21/2015 1416   HCT 26.4 (L) 07/26/2021 0437   HCT 39.7 11/21/2015 1416   PLT 236 07/26/2021 0437   PLT 226 11/21/2015 1416   MCV 81.5 07/26/2021 0437   MCV 82 11/21/2015 1416   MCV 82 10/12/2014 0516   MCH 26.9 07/26/2021 0437   MCHC 33.0 07/26/2021 0437   RDW 14.9 07/26/2021 0437   RDW 16.7 (H) 11/21/2015 1416   RDW 14.9 (H) 10/12/2014 0516   LYMPHSABS 1,488 11/30/2019 1240   LYMPHSABS 1.7 11/21/2015 1416   LYMPHSABS 1.5 10/12/2014 0516   MONOABS 0.5 07/23/2018 1254   MONOABS 0.7 10/12/2014 0516   EOSABS 281 11/30/2019 1240   EOSABS 0.3 11/21/2015 1416   EOSABS 0.1 10/12/2014 0516   BASOSABS 77 11/30/2019 1240   BASOSABS 0.0 11/21/2015 1416   BASOSABS 0.1 10/12/2014 0516    CMP     Component Value Date/Time   NA 132 (L) 07/26/2021 0437   NA 133 (L) 03/07/2016 1317   NA 135 10/12/2014 0516   K 4.0 07/26/2021 0437   K 4.3 10/12/2014 0516   CL 98 07/26/2021 0437   CL 109 10/12/2014 0516   CO2 25 07/26/2021 0437   CO2 24 10/12/2014 0516   GLUCOSE 113 (H) 07/26/2021 0437   GLUCOSE 146 (H) 10/12/2014 0516   BUN 36 (H) 07/26/2021 0437   BUN 18 03/07/2016 1317   BUN 39 (H) 10/12/2014 0516   CREATININE 0.98 07/26/2021 0437   CREATININE 0.81 11/30/2019 1240    CALCIUM 8.8 (L) 07/26/2021 0437   CALCIUM 7.6 (L) 10/12/2014 0516   PROT 5.5 (L) 07/26/2021 0437   PROT 6.7 11/21/2015 1416   PROT 6.3 (L) 10/11/2014 1203   ALBUMIN 2.8 (L) 07/26/2021 0437   ALBUMIN 4.1 11/21/2015 1416   ALBUMIN 3.1 (L) 10/11/2014 1203   AST 49 (H) 07/26/2021 0437   AST 21 10/11/2014 1203   ALT 34 07/26/2021 0437   ALT 13 (L) 10/11/2014 1203   ALKPHOS 80 07/26/2021 0437   ALKPHOS 66 10/11/2014 1203   BILITOT 0.6 07/26/2021 0437   BILITOT 0.2 11/21/2015 1416   BILITOT 0.4 10/11/2014 1203   GFRNONAA >60 07/26/2021 0437   GFRNONAA 73 11/30/2019  1240   GFRAA >60 12/31/2019 0651   GFRAA 84 11/30/2019 1240    glycosylated hemoglobin  Lipid Panel     Component Value Date/Time   CHOL 103 07/20/2021 0505   CHOL 182 11/21/2015 1416   TRIG 85 07/26/2021 0437   HDL 41 07/29/2021 0505   HDL 44 11/21/2015 1416   CHOLHDL 2.5 07/31/2021 0505   VLDL 14 07/23/2021 0505   LDLCALC 48 07/18/2021 0505   LDLCALC 68 11/30/2019 1240   EEG-no seizures.  Continuous slowing which is generalized as seen with nonspecific encephalopathy  Imaging I have reviewed images in epic and the results pertinent to this consultation are: CT head with no acute changes CTA head and neck with no emergent LVO.  Severe bilateral vertebral artery origin stenosis.  Cervical carotid atherosclerosis without significant stenosis.  Pulmonary edema and pleural effusions-also seen on the same day chest CTA.  Assessment: 76 year old who was in the Cath Lab for balloon pump placement as a part of her work-up and management for severe coronary artery disease and aortic stenosis, during the procedure became less responsive and then unresponsive to verbal stimulation.  No loss of pulses.  Noted to be not responding to commands for which a code stroke was called.  Patient did not seem to be responding appropriately to voice and was also felt to be somewhat weaker on the right.  Labs showed hypoxia.  Emergently  intubated.  Noncontrast head CT unremarkable. On today's exam, she is remains intubated but her exam neurologically has no focality. I suspect toxic metabolic encephalopathy in the setting of hypoxia during the procedure. EEG negative for abnormality  Recommendations: Medical management per cardiology and CCM teams as you are. No further neurological work-up at this time. Plan discussed with Dr. Carlis Abbott -- Amie Portland, MD Neurologist [facility] Pager: [phone]

## 2021-07-26 NOTE — Progress Notes (Addendum)
Around midnight, RN noted that patient's arterial waveform on IABP had significant artifact- arterial waveform on monitor remained normal. This would occasionally self resolve but after 0200, remained with significant artifact. Site remained stable throughout and pedal pulses are intact. UOP remains ~110mL/hr.   Repositioned leads on patient's chest without resolve in artifact on IABP. Difficult to determine if timing was appropriate for this reason. Consulted with Consulting civil engineer.   Reached out to IABP support who suggested changing transducing from arterial waveform rather that fiberoptic cable. This resolved the artifact on IABP waveform. IABP support informed this RN that this suggested the IABP was malpositioned and to confirm placement with CXR.   CXR obtained- critical result called to Dr. Daphine Deutscher. Dr. Daphine Deutscher said she was unfamiliar with how to reposition IABP and that she would pass on to day team.  RN  paged Dr.Martin again and requested that she reach out to  Dr. Gala Romney  and confirm no need for immediate intervention. As we were on the phone Dr. Daphine Deutscher confirmed that Dr. Gala Romney replied back and he will address placement in the morning.   Patient remained hemodynamically stable with pulses intact, slowly decreasing UOP, and stable site. RN will report this off to day team.

## 2021-07-26 NOTE — Progress Notes (Signed)
ANTICOAGULATION CONSULT NOTE Pharmacy Consult for heparin Indication: IABP Brief A/P: Heparin level subtherapeutic Increase Heparin rate Allergies  Allergen Reactions   Meloxicam Other (See Comments)    GI bleeding   Nsaids Other (See Comments)    Gi bleeding   Sitagliptin Hives and Itching    Januvia     Patient Measurements: Height: 5\' 1"  (154.9 cm) Weight: 60.2 kg (132 lb 11.5 oz) IBW/kg (Calculated) : 47.8 Heparin Dosing Weight: 60 kg  Vital Signs: Temp: 98.8 F (37.1 C) (01/26 2300) Temp Source: Esophageal (01/26 2000) BP: 136/58 (01/26 2300) Pulse Rate: 72 (01/26 2322)  Labs: Recent Labs    07/29/21 1228 29-Jul-2021 2017 07/11/2021 0505 07/05/2021 1100 07/10/2021 1103 07/09/2021 1523 07/29/2021 1641 07/26/21 0437 07/26/21 1400 07/26/21 2321  HGB 10.8*  --  8.7*  --   --   --  12.9 8.7*  --   --   HCT 32.8*  --  26.2*  --   --   --  38.0 26.4*  --   --   PLT 285  --  187  --   --   --   --  236  --   --   HEPARINUNFRC 0.42   < > 0.22*   < >  --   --   --  <0.10* <0.10* 0.12*  CREATININE 0.94  --  0.91  --   --  1.01*  --  0.98  --   --   TROPONINIHS  --   --   --   --  1,468* 1,949*  --   --   --   --    < > = values in this interval not displayed.     Estimated Creatinine Clearance: 42 mL/min (by C-G formula based on SCr of 0.98 mg/dL).   Assessment: 76 y.o. female with IABP for heparin  Goal of Therapy:  Heparin level 0.2-0.5 units/ml Monitor platelets by anticoagulation protocol: Yes   Plan:  Increase Heparin 1300 units/hr Follow-up am labs.  66, PharmD, BCPS

## 2021-07-26 NOTE — Progress Notes (Signed)
NAME:  Andrea Holden, MRN:  [id number], DOB:  Sep 06, 1945, LOS: 2 ADMISSION DATE:  07/29/2021, CONSULTATION DATE:  07/14/2021 REFERRING MD:  Harl Bowie, CHIEF COMPLAINT:  Acute respirtatory failure   History of Present Illness:  Andrea Holden is a 76 y.o. female who is seen in consultation at the request of Dr. Harl Bowie for recommendations on further evaluation and management of acute respiratory failure.   They presented to the [facility] ED on 1/23 with a chief complaint of dyspnea, back pain, and NSTEMI, who was found to have LV dysfunction in the setting of sever aortic stenosis and critical left mitral disease  They have a pertinent past medical history of aortic stenosis, PAD s/p multiple LE PTAs/stents, mesenteric ischemia/ischemic colitis, s/p gastric bypass, HLD, DMII, COPD, active smoker.  On 1/25 they went to the cath lab for placement of a IABP. During the procedure the patient became unresponsive. They were intubated. A code stroke was called.   PCCM was consulted for assistance  Pertinent  Medical History  Aortic stenosis, PAD s/p multiple LE PTAs/stents, mesenteric ischemia/ischemic colitis, s/p gastric bypass, HLD, DMII, COPD, active smoker  Significant Hospital Events: Including procedures, antibiotic start and stop dates in addition to other pertinent events   1/21 admitted to [place] with NSTEMI 1/23 [facility]  Interim History / Subjective:  This morning mild chest tightness, otherwise she is awake and comfortable.  Objective   Blood pressure 140/64, pulse (!) 221, temperature 99.5 F (37.5 C), resp. rate (!) 22, height 5\' 1"  (1.549 m), weight 60.2 kg, SpO2 97 %.    Vent Mode: PSV;CPAP FiO2 (%):  [40 %-100 %] 40 % Set Rate:  [22 bmp-26 bmp] 26 bmp Vt Set:  [380 mL] 380 mL PEEP:  [5 cmH20] 5 cmH20 Pressure Support:  [10 cmH20] 10 cmH20 Plateau Pressure:  [15 cmH20-20 cmH20] 16 cmH20   Intake/Output Summary (Last 24 hours) at 07/26/2021 0943 Last data filed at  07/26/2021 0900 Gross per 24 hour  Intake 942.29 ml  Output 1255 ml  Net -312.71 ml    Filed Weights   07/28/2021 0300 07/15/2021 1600 07/26/21 0600  Weight: 61.7 kg 62.7 kg 60.2 kg    Examination: General: ill appearing woman lying in bed inn NAD HEENT: [place]/AT, eyes anicteric Neuro: RASS 0, moving all extremities, writing to communicate. Comprehension intact. CV: S1S2, RRR, IABP.  PULM: diminished breath sounds bilaterally, no wheezing, no significant ETT secretions  GI: soft, NT Extremities: +clubbing, no cyanosis, mild peripheral edema. RLE IABP Skin:  warm, dry   Na+ 132 K+ 4.0 Bicarb 25 BUN 36 Cr 0.98 Phos 5.5 AST 49 ALT 34 T bili 0.6 WBC 12.3 HGB 8.7/26.4 Heparin <0.1  CXR personally reviewed> bilateral effusions, increased opacities  Resolved Hospital Problem list     Assessment & Plan:  Acute respiratory failure with hypoxia, suspect she had hypercapnia leading to encephalopathy  Acute cardiogenic pulmonary edema R>L pleural effusions COPD Active smoker -LTVV, 4-8cc/kg IBW with goal Pplat<30 and DP<15 -VAP prevention prtocol -PAD protocol -daily SAT &SBT as appropriate-- chest pressure today when weaning -diuresis -duonebs [id number]  Acute metabolic encephalopathy, unlikely CVA  -Appreciate Neurology's management -neuroprotective measures -monitor for hypercapnia  Severe aortic stenosis Moderate MR  Acute HFrEF with cardiogenic shock requiring inotropes and IABP CAD- severe 3 vessel disease NSTEMI -IABP management per cardiology -heparin -con't NE -intolerant of GDMT due to shock -daily aspirin and statin -diuresis, lasix 80mg  BID  DMII, controlled hyperglycemia -SSI PRN -goal BG 140-180  GERD -  con't PPI  HX PAD HX HLD HX mesenteric ischemia, concern that this could be related to recent severe weight loss -continue lipitor 40 -monitor exam  Hyponatremia due to heart failure -diuresis -monitor  Hyperphosphatemia -monitor  Severe  protein energy malnutrition-- reportedly has lost about 100# this year. -anticipate she would not be a good candidate for starting trickle TF yet with tenuous cardiovascular status  Ms. Weida and her children were updated at bedside with Dr. Haroldine Laws. I am concerned about her declining health over the past few months and severe valvular and coronary disease with COPD, PAD, and mesenteric ischemia. Despite aggressive care, it is possible she will not make a recovery.  Best Practice (right click and "Reselect all SmartList Selections" daily)   Diet/type: NPO w/ meds via tube DVT prophylaxis: SCD, pending head scan GI prophylaxis: PPI Lines: Arterial Line and yes and it is still needed Foley:  Yes, and it is still needed Code Status:  full code Last date of multidisciplinary goals of care discussion [ 1/26 ]  Labs   CBC: Recent Labs  Lab 07/22/21 0439 07/23/21 0430 07/15/2021 1228 07/12/2021 0505 07/06/2021 1641 07/26/21 0437  WBC 10.2 9.3 16.3* 10.7*  --  12.3*  HGB 9.7* 9.2* 10.8* 8.7* 12.9 8.7*  HCT 28.1* 27.5* 32.8* 26.2* 38.0 26.4*  MCV 79.8* 79.0* 82.0 81.1  --  81.5  PLT 194 181 285 187  --  236     Basic Metabolic Panel: Recent Labs  Lab 07/23/21 0430 07/10/2021 1228 07/17/2021 0505 07/13/2021 1523 07/01/2021 1641 07/26/21 0437  NA 127* 125* 128* 127* 157* 132*  K 3.9 4.0 4.3 4.4 4.7 4.0  CL 94* 94* 95* 96*  --  98  CO2 25 23 23 23   --  25  GLUCOSE 111* 248* 123* 207*  --  113*  BUN 24* 33* 33* 35*  --  36*  CREATININE 0.68 0.94 0.91 1.01*  --  0.98  CALCIUM 8.6* 8.6* 8.7* 8.4*  --  8.8*  MG 1.5*  --  1.7  --   --  2.1  PHOS  --   --   --   --   --  5.5*    GFR: Estimated Creatinine Clearance: 42 mL/min (by C-G formula based on SCr of 0.98 mg/dL). Recent Labs  Lab 07/23/21 0430 07/13/2021 1228 07/24/2021 0505 07/26/21 0437  WBC 9.3 16.3* 10.7* 12.3*  LATICACIDVEN  --  1.2  --   --      This patient is critically ill with multiple organ system failure which  requires frequent high complexity decision making, assessment, support, evaluation, and titration of therapies. This was completed through the application of advanced monitoring technologies and extensive interpretation of multiple databases. During this encounter critical care time was devoted to patient care services described in this note for 45 minutes.  Julian Hy, DO 07/26/21 1:14 PM [facility]

## 2021-07-26 NOTE — Progress Notes (Addendum)
Advanced Heart Failure Rounding Note  PCP-Cardiologist: Ida Rogue, MD   Subjective:    1/25: IABP placed. Developed Code Stroke after cath. Head CT and CTA no evidence of CVA or LVO. Intubated for airway protection.   IABP 1:2. Remains on vent. Awake and following commands. Nodes head "No" when asked if any current CP.   Hs trop 3,727>>4,188>1,468>>1,949  BP stable. Diuresing w/ IV Lasix, Renal fx ok. SCr 0.98. CXR today w/ pulmonary edema   Objective:   Weight Range: 60.2 kg Body mass index is 25.08 kg/m.   Vital Signs:   Temp:  [95.9 F (35.5 C)-99.1 F (37.3 C)] 99.1 F (37.3 C) (01/26 0645) Pulse Rate:  [63-280] 200 (01/26 0600) Resp:  [10-91] 15 (01/26 0645) BP: (84-189)/(39-134) 127/77 (01/26 0700) SpO2:  [84 %-100 %] 98 % (01/26 0645) Arterial Line BP: (86-119)/(33-69) 100/35 (01/26 0230) FiO2 (%):  [50 %-100 %] 50 % (01/26 0400) Weight:  [60.2 kg-62.7 kg] 60.2 kg (01/26 0600) Last BM Date: 07/12/2021  Weight change: Filed Weights   07/01/2021 0300 07/05/2021 1600 07/26/21 0600  Weight: 61.7 kg 62.7 kg 60.2 kg    Intake/Output:   Intake/Output Summary (Last 24 hours) at 07/26/2021 0713 Last data filed at 07/26/2021 0700 Gross per 24 hour  Intake 924.08 ml  Output 1195 ml  Net -270.92 ml      Physical Exam    General:  ill appearing, thin WF, intubated, awake and following commands.   HEENT: Normal + ETT  Neck: Supple. JVD elevated . Carotids 2+ bilat; no bruits. No lymphadenopathy or thyromegaly appreciated. Cor: PMI nondisplaced. Regular rate & rhythm. 3/6 AS  Lungs: intubated and clear  Abdomen: Soft, nontender, nondistended. No hepatosplenomegaly. No bruits or masses. Good bowel sounds. Extremities: No cyanosis, clubbing, rash, edema + Rt Fem IABP site stable, palpable pedal pulses  Neuro: intubated, awake on vent, following commands.    Telemetry   NSR w/ occasional PVCs. HR 80s, personally reviewed   EKG    NSR 82 bpm   Labs     CBC Recent Labs    07/06/2021 0505 07/06/2021 1641 07/26/21 0437  WBC 10.7*  --  12.3*  HGB 8.7* 12.9 8.7*  HCT 26.2* 38.0 26.4*  MCV 81.1  --  81.5  PLT 187  --  287   Basic Metabolic Panel Recent Labs    07/22/2021 0505 07/02/2021 1523 07/28/2021 1641 07/26/21 0437  NA 128* 127* 157* 132*  K 4.3 4.4 4.7 4.0  CL 95* 96*  --  98  CO2 23 23  --  25  GLUCOSE 123* 207*  --  113*  BUN 33* 35*  --  36*  CREATININE 0.91 1.01*  --  0.98  CALCIUM 8.7* 8.4*  --  8.8*  MG 1.7  --   --  2.1  PHOS  --   --   --  5.5*   Liver Function Tests Recent Labs    07/26/21 0437  AST 49*  ALT 34  ALKPHOS 80  BILITOT 0.6  PROT 5.5*  ALBUMIN 2.8*   No results for input(s): LIPASE, AMYLASE in the last 72 hours. Cardiac Enzymes No results for input(s): CKTOTAL, CKMB, CKMBINDEX, TROPONINI in the last 72 hours.  BNP: BNP (last 3 results) Recent Labs    07/21/21 2329  BNP 2,354.7*    ProBNP (last 3 results) No results for input(s): PROBNP in the last 8760 hours.   D-Dimer No results for input(s): DDIMER in the  last 72 hours. Hemoglobin A1C No results for input(s): HGBA1C in the last 72 hours. Fasting Lipid Panel Recent Labs    07/21/2021 0505 07/26/21 0437  CHOL 103  --   HDL 41  --   LDLCALC 48  --   TRIG 70 85  CHOLHDL 2.5  --    Thyroid Function Tests No results for input(s): TSH, T4TOTAL, T3FREE, THYROIDAB in the last 72 hours.  Invalid input(s): FREET3  Other results:   Imaging    CARDIAC CATHETERIZATION  Result Date: 07/05/2021 Abdominal aortogram showed patent ilio-femoral system bilaterally with moderate plaque. Successful IABP placement. Post-procedure the patient had decreased responsiveness and focal neuro deficits. Code stroke was called. The patient was evaluated by the Stroke and CCM team. She was emergently intubated and take for stat head CT and CTA both of which were unremarkable. I updated family personally. Glori Bickers, MD 6:20 PM  PERIPHERAL  VASCULAR CATHETERIZATION  Result Date: 07/05/2021 Abdominal aortogram showed patent ilio-femoral system bilaterally with moderate plaque. Successful IABP placement. Post-procedure the patient had decreased responsiveness and focal neuro deficits. Code stroke was called. The patient was evaluated by the Stroke and CCM team. She was emergently intubated and take for stat head CT and CTA both of which were unremarkable. I updated family personally. Glori Bickers, MD 6:20 PM  CT CORONARY [facility] W/CTA COR W/SCORE Lewanda Rife W/CM &/OR WO/CM  Addendum Date: 07/13/2021   ADDENDUM REPORT: 07/21/2021 13:42 CLINICAL DATA:  Severe Aortic Stenosis. EXAM: Cardiac TAVR CT TECHNIQUE: A non-contrast, gated CT scan was obtained with axial slices of 3 mm through the heart for aortic valve calcium scoring. A 90 kV retrospective, gated, contrast cardiac scan was obtained. Gantry rotation speed was 250 msecs and collimation was 0.6 mm. Nitroglycerin was not given. The 3D data set was reconstructed in 5% intervals of the 0-95% of the R-R cycle. Systolic and diastolic phases were analyzed on a dedicated workstation using MPR, MIP, and VRT modes. The patient received 100 cc of contrast. FINDINGS: Image quality: Excellent. Noise artifact is: Limited. Valve Morphology: Tricuspid aortic valve with severe diffuse calcifications and severely thickened leaflets. Severely restricted leaflet motion in systole. Bulky calcification of the [place]. Aortic Valve Calcium score: 2127 Aortic annular dimension: Phase assessed: 10% Annular area: 445 mm2 Annular perimeter: 75.9 mm Max diameter: 25.8 mm Min diameter: 22.6 mm LVOT: 464 mm2 Annular and subannular calcification: None. Membranous septum length: 10.4 mm Optimal coplanar projection: LAO 3 CAU 11 Coronary Artery Height above Annulus: Left Main: 14.0 mm Right Coronary: 18.8 mm Sinus of Valsalva Measurements: Non-coronary: 29 mm Right-coronary: 28 mm Left-coronary: 31 mm Sinus of Valsalva Height:  Non-coronary: 22.6 mm Right-coronary: 22.0 mm Left-coronary: 21.0 mm Sinotubular Junction: 25 mm Ascending Thoracic Aorta: 31 mm Coronary Arteries: Normal coronary origin. Right dominance. The study was performed without use of NTG and is insufficient for plaque evaluation. Please refer to recent cardiac catheterization for coronary assessment. Severe 3-vessel coronary calcifications. Cardiac Morphology: Right Atrium: Right atrial size is within normal limits. Right Ventricle: The right ventricular cavity is within normal limits. Left Atrium: Left atrial size is normal in size with no left atrial appendage filling defect. Left Ventricle: The ventricular cavity size is mildly dilated. There is no abnormal filling defect. Mildly reduced left ventricular function, LVEF=45%. There is hypokinesis of the mid septum/anterior wall into the apex concerning for LAD infarction. Pulmonary arteries: Dilated, suggestive of pulmonary hypertensin. No proximal filling defect. Pulmonary veins: Normal pulmonary venous drainage. Pericardium: Normal thickness with  no significant effusion or calcium present. Mitral Valve: The mitral valve is degenerative with moderate annular calcium. Extra-cardiac findings: See attached radiology report for non-cardiac structures. IMPRESSION: 1. Tricuspid aortic valve with bulky calcification of the NCC. 2. Annular measurements support a 26 mm S3 (445 mm2). Also, favorable for 29 mm Evolut Pro. 3. No significant annular or subannular calcifications. 4. Sufficient coronary to annulus distance. 5. Optimal Fluoroscopic Angle for Delivery: LAO 3 CAU 11 6. Severe 3-vessel coronary calcifications. 7. Mildly reduced left ventricular function, LVEF=45%. There is hypokinesis of the mid to distal anteroseptum/anterior wall into the apex concerning for LAD infarction. [place] T. Audie Box, MD Electronically Signed   By: Eleonore Chiquito M.D.   On: 07/10/2021 13:42   Result Date: 07/11/2021 EXAM: OVER-READ  INTERPRETATION  CT CHEST The following report is an over-read performed by radiologist Dr. Vinnie Langton of [facility], [place] on 07/07/2021. This over-read does not include interpretation of cardiac or coronary anatomy or pathology. The coronary calcium score/coronary CTA interpretation by the cardiologist is attached. COMPARISON:  Chest CTA 02/08/2011. FINDINGS: Extracardiac findings will be described separately under dictation for contemporaneously obtained CTA chest abdomen and pelvis. IMPRESSION: Please see separate dictation for contemporaneously obtained CTA chest, abdomen and pelvis dated 07/16/2021 for full description of relevant extracardiac findings. Electronically Signed: By: Vinnie Langton M.D. On: 07/03/2021 10:25   DG CHEST PORT 1 VIEW  Result Date: 07/26/2021 CLINICAL DATA:  Intra-aortic balloon pump. EXAM: PORTABLE CHEST 1 VIEW COMPARISON:  Chest x-ray 07/21/2021. FINDINGS: Endotracheal tube tip is 5 cm above the carina. Enteric tube extends below the diaphragm. Intra-aortic balloon pump tip is 4 cm below the level of the aortic arch. The cardiac silhouette is mildly enlarged. There central pulmonary vascular congestion. There are perihilar interstitial opacities, right greater than left. Moderate right and small left pleural effusions are present. There is no pneumothorax. There are patchy opacities in both lung bases. No acute fractures are seen. IMPRESSION: 1. Endotracheal tube tip 5 cm above carina. 2. Intra-aortic balloon pump tip is low, 4 cm inferior to the aortic arch. 3. Moderate pulmonary edema pattern with bilateral pleural effusions, right greater than left. Electronically Signed   By: Ronney Asters M.D.   On: 07/26/2021 02:53   Portable Chest x-ray  Result Date: 07/29/2021 CLINICAL DATA:  Endotracheal tube and nasogastric tube insertion. EXAM: PORTABLE CHEST 1 VIEW COMPARISON:  07/10/2021 and CT chest 07/05/2021. FINDINGS: Endotracheal tube terminates approximately 5.8  cm above the carina. Nasogastric tube terminates in the stomach with the side port at or just above the gastroesophageal junction. Difficult related pads are in place. Heart size stable. Diffuse mixed interstitial and airspace opacification bilaterally with bilateral pleural effusions. IMPRESSION: 1. Endotracheal tube is in satisfactory position. 2. Nasogastric tube could be advanced approximately 10 cm to better position the side port beyond the gastroesophageal junction. 3. Pulmonary edema and bilateral pleural effusions. Electronically Signed   By: Lorin Picket M.D.   On: 07/28/2021 15:35   CT ANGIO CHEST AORTA W/CM & OR WO/CM  Result Date: 07/08/2021 CLINICAL DATA:  76 year old female with history of severe aortic stenosis. Preprocedural study prior to potential transcatheter aortic valve replacement (TAVR) procedure. EXAM: CT ANGIOGRAPHY CHEST, ABDOMEN AND PELVIS TECHNIQUE: Non-contrast CT of the chest was initially obtained. Multidetector CT imaging through the chest, abdomen and pelvis was performed using the standard protocol during bolus administration of intravenous contrast. Multiplanar reconstructed images and MIPs were obtained and reviewed to evaluate the vascular anatomy. RADIATION DOSE  REDUCTION: This exam was performed according to the departmental dose-optimization program which includes automated exposure control, adjustment of the mA and/or kV according to patient size and/or use of iterative reconstruction technique. CONTRAST:  44m OMNIPAQUE IOHEXOL 350 MG/ML SOLN COMPARISON:  CTA of the abdomen and pelvis 12/29/2019. Chest CTA 02/08/2011. FINDINGS: CTA CHEST FINDINGS Cardiovascular: Heart size is normal. There is no significant pericardial fluid, thickening or pericardial calcification. There is aortic atherosclerosis, as well as atherosclerosis of the great vessels of the mediastinum and the coronary arteries, including calcified atherosclerotic plaque in the left main, left anterior  descending, left circumflex and right coronary arteries. Severe thickening and calcification of the aortic valve. Calcifications of the mitral annulus. Mediastinum/Lymph Nodes: No pathologically enlarged mediastinal or hilar lymph nodes. Esophagus is unremarkable in appearance. No axillary lymphadenopathy. Lungs/Pleura: Moderate bilateral pleural effusions lying dependently with areas of passive atelectasis in the lower lobes of the lungs bilaterally. Widespread but patchy areas of ground-glass attenuation and interlobular septal thickening throughout the lungs, indicative of pulmonary edema. No confluent consolidative airspace disease. No definite suspicious appearing pulmonary nodules or masses are noted. Musculoskeletal/Soft Tissues: There are no aggressive appearing lytic or blastic lesions noted in the visualized portions of the skeleton. CTA ABDOMEN AND PELVIS FINDINGS Hepatobiliary: 7 mm low-attenuation lesion in segment 2 of the liver, too small to characterize, but statistically likely to represent a tiny cyst. No other suspicious appearing hepatic lesions. No intra or extrahepatic biliary ductal dilatation. Gallbladder is not visualized, presumably surgically absent. Pancreas: No pancreatic mass. No pancreatic ductal dilatation. No pancreatic or peripancreatic fluid collections or inflammatory changes. Spleen: Unremarkable. Adrenals/Urinary Tract: Bilateral kidneys and adrenal glands are normal in appearance. No hydroureteronephrosis. Urinary bladder is nearly completely decompressed, but otherwise unremarkable in appearance. Stomach/Bowel: The appearance of the stomach is normal. There is no pathologic dilatation of small bowel or colon. Numerous colonic diverticulae are noted, without surrounding inflammatory changes to suggest an acute diverticulitis at this time. Normal appendix. Vascular/Lymphatic: Aortic atherosclerosis, with vascular findings and measurements pertinent to potential TAVR procedure,  as detailed below. No aneurysm or dissection noted in the abdominal or pelvic vasculature. No lymphadenopathy noted in the abdomen or pelvis. Reproductive: Status post hysterectomy. Ovaries are not confidently identified may be surgically absent or atrophic. Other: No significant volume of ascites.  No pneumoperitoneum. Musculoskeletal: There are no aggressive appearing lytic or blastic lesions noted in the visualized portions of the skeleton. VASCULAR MEASUREMENTS PERTINENT TO TAVR: AORTA: Minimal Aortic Diameter-13 x 12 mm Severity of Aortic Calcification-severe RIGHT PELVIS: Right Common Iliac Artery - Minimal Diameter-8.0 x 4.1 mm Tortuosity-mild Calcification-severe Right External Iliac Artery - Minimal Diameter-4.2 x 5.1 mm Tortuosity-mild-to-moderate Calcification-moderate to severe Right Common Femoral Artery - Minimal Diameter-6.7 x 5.9 mm Tortuosity-mild Calcification-moderate LEFT PELVIS: Left Common Iliac Artery - Minimal Diameter-4.2 x 6.2 mm Tortuosity-mild Calcification-severe Left External Iliac Artery - Minimal Diameter-5.6 x 4.9 mm Tortuosity-mild-to-moderate Calcification-moderate to severe Left Common Femoral Artery - Minimal Diameter-5.2 x 4.7 mm Tortuosity-mild Calcification-mild Review of the MIP images confirms the above findings. IMPRESSION: 1. Vascular findings and measurements pertinent to potential TAVR procedure, as detailed above. 2. Severe thickening calcification of the aortic valve, compatible with reported clinical history of severe aortic stenosis. 3. Aortic atherosclerosis, in addition to left main and three-vessel coronary artery disease. 4. The appearance of the lungs suggests interstitial pulmonary edema. There also moderate bilateral pleural effusions and areas of passive atelectasis in the lower lobes of the lungs bilaterally. Given the normal left ventricular  size, clinical correlation for signs and symptoms of diastolic heart failure is recommended. 5. Colonic  diverticulosis without evidence of acute diverticulitis at this time. 6. Additional incidental findings, as above. Electronically Signed   By: Vinnie Langton M.D.   On: 07/04/2021 11:26   CT HEAD CODE STROKE WO CONTRAST  Result Date: 07/19/2021 CLINICAL DATA:  Code stroke. Neuro deficit, acute, stroke suspected. Unresponsive. EXAM: CT HEAD WITHOUT CONTRAST TECHNIQUE: Contiguous axial images were obtained from the base of the skull through the vertex without intravenous contrast. RADIATION DOSE REDUCTION: This exam was performed according to the departmental dose-optimization program which includes automated exposure control, adjustment of the mA and/or kV according to patient size and/or use of iterative reconstruction technique. COMPARISON:  Head CT 03/28/2021 FINDINGS: The study is mildly motion degraded. Brain: There is no evidence of an acute infarct, intracranial hemorrhage, mass, midline shift, or extra-axial fluid collection. Hypodensities in the cerebral white matter bilaterally are similar to the prior CT and are nonspecific but compatible with mild chronic small vessel ischemic disease. There is mild cerebral atrophy. Vascular: Residual intravascular contrast material from today's earlier CTA of the chest, abdomen, and pelvis. Calcified atherosclerosis at the skull base. Skull: No fracture or suspicious osseous lesion. Sinuses/Orbits: Paranasal sinuses and mastoid air cells are clear. Bilateral cataract extraction. Other: None. ASPECTS [facility] Stroke Program Early CT Score) - Ganglionic level infarction (caudate, lentiform nuclei, internal capsule, insula, M1-M3 cortex): 7 - Supraganglionic infarction (M4-M6 cortex): 3 Total score (0-10 with 10 being normal): 10 IMPRESSION: 1. No evidence of acute intracranial abnormality.  ASPECTS of 10. 2. Mild chronic small vessel ischemic disease. These results were called by telephone at the time of interpretation on 07/11/2021 at 2:33 p.m. to Dr. Amie Portland,  who verbally acknowledged these results. Electronically Signed   By: Logan Bores M.D.   On: 07/05/2021 14:55   CT ANGIO HEAD NECK W WO CM (CODE STROKE)  Result Date: 07/03/2021 CLINICAL DATA:  Neuro deficit, acute, stroke suspected. Unresponsive. EXAM: CT ANGIOGRAPHY HEAD AND NECK TECHNIQUE: Multidetector CT imaging of the head and neck was performed using the standard protocol during bolus administration of intravenous contrast. Multiplanar CT image reconstructions and MIPs were obtained to evaluate the vascular anatomy. Carotid stenosis measurements (when applicable) are obtained utilizing NASCET criteria, using the distal internal carotid diameter as the denominator. RADIATION DOSE REDUCTION: This exam was performed according to the departmental dose-optimization program which includes automated exposure control, adjustment of the mA and/or kV according to patient size and/or use of iterative reconstruction technique. CONTRAST:  87m OMNIPAQUE IOHEXOL 350 MG/ML SOLN COMPARISON:  Chest CTA 07/26/2021. FINDINGS: CTA NECK FINDINGS Aortic arch: Standard 3 vessel aortic arch with mild-to-moderate atherosclerotic plaque. Patent brachiocephalic and subclavian arteries without evidence of a significant stenosis. Aortic balloon pump partially visualized in the proximal descending thoracic aorta. Right carotid system: Patent with a mild-to-moderate amount of calcified plaque at the carotid bifurcation. No evidence of a significant stenosis or dissection. Left carotid system: Patent with mild calcified plaque at the common carotid origin and moderate calcified plaque at the carotid bifurcation resulting in less than 50% stenosis. No evidence of dissection. Vertebral arteries: Patent with the right vertebral artery being dominant. Calcified plaque results in severe stenosis of both vertebral origins, left greater than right. There is also mild to moderate irregular narrowing of the more distal left P1 segment. Skeleton:  Advanced disc degeneration throughout the mid and lower cervical spine. Other neck: No evidence of cervical lymphadenopathy or  mass. Upper chest: Endotracheal tube terminating well above the carina. Partially visualized right larger than left pleural effusions with associated atelectasis, patchy bilateral ground-glass lung opacities, and interlobular septal thickening consistent with pulmonary edema as shown on today's earlier chest CTA. Review of the MIP images confirms the above findings CTA HEAD FINDINGS Anterior circulation: The internal carotid arteries are patent from skull base to carotid termini with calcified plaque bilaterally not resulting in significant stenosis. ACAs and MCAs are patent without evidence of a proximal branch occlusion or significant proximal stenosis. No aneurysm is identified. Posterior circulation: The intracranial vertebral arteries are patent to the basilar with mild atherosclerotic narrowing on the left. Patent PICA and SCA origins are seen bilaterally. The basilar artery is widely patent. There are small bilateral posterior communicating arteries. Both PCAs are patent without evidence of a significant proximal stenosis. No aneurysm is identified. Venous sinuses: Patent. Anatomic variants: None. Review of the MIP images confirms the above findings IMPRESSION: 1. No large vessel occlusion. 2. Severe bilateral vertebral artery origin stenoses. 3. Cervical carotid atherosclerosis without significant stenosis. 4. Pulmonary edema and pleural effusions as shown on today's earlier chest CTA. These results were communicated to Dr. Rory Percy at 2:58 pm on 07/12/2021 by text page via the [facility] messaging system. Electronically Signed   By: Logan Bores M.D.   On: 07/11/2021 15:14   CT Angio Abd/Pel w/ and/or w/o  Result Date: 07/13/2021 CLINICAL DATA:  76 year old female with history of severe aortic stenosis. Preprocedural study prior to potential transcatheter aortic valve replacement (TAVR)  procedure. EXAM: CT ANGIOGRAPHY CHEST, ABDOMEN AND PELVIS TECHNIQUE: Non-contrast CT of the chest was initially obtained. Multidetector CT imaging through the chest, abdomen and pelvis was performed using the standard protocol during bolus administration of intravenous contrast. Multiplanar reconstructed images and MIPs were obtained and reviewed to evaluate the vascular anatomy. RADIATION DOSE REDUCTION: This exam was performed according to the departmental dose-optimization program which includes automated exposure control, adjustment of the mA and/or kV according to patient size and/or use of iterative reconstruction technique. CONTRAST:  91m OMNIPAQUE IOHEXOL 350 MG/ML SOLN COMPARISON:  CTA of the abdomen and pelvis 12/29/2019. Chest CTA 02/08/2011. FINDINGS: CTA CHEST FINDINGS Cardiovascular: Heart size is normal. There is no significant pericardial fluid, thickening or pericardial calcification. There is aortic atherosclerosis, as well as atherosclerosis of the great vessels of the mediastinum and the coronary arteries, including calcified atherosclerotic plaque in the left main, left anterior descending, left circumflex and right coronary arteries. Severe thickening and calcification of the aortic valve. Calcifications of the mitral annulus. Mediastinum/Lymph Nodes: No pathologically enlarged mediastinal or hilar lymph nodes. Esophagus is unremarkable in appearance. No axillary lymphadenopathy. Lungs/Pleura: Moderate bilateral pleural effusions lying dependently with areas of passive atelectasis in the lower lobes of the lungs bilaterally. Widespread but patchy areas of ground-glass attenuation and interlobular septal thickening throughout the lungs, indicative of pulmonary edema. No confluent consolidative airspace disease. No definite suspicious appearing pulmonary nodules or masses are noted. Musculoskeletal/Soft Tissues: There are no aggressive appearing lytic or blastic lesions noted in the visualized  portions of the skeleton. CTA ABDOMEN AND PELVIS FINDINGS Hepatobiliary: 7 mm low-attenuation lesion in segment 2 of the liver, too small to characterize, but statistically likely to represent a tiny cyst. No other suspicious appearing hepatic lesions. No intra or extrahepatic biliary ductal dilatation. Gallbladder is not visualized, presumably surgically absent. Pancreas: No pancreatic mass. No pancreatic ductal dilatation. No pancreatic or peripancreatic fluid collections or inflammatory changes. Spleen: Unremarkable. Adrenals/Urinary Tract:  Bilateral kidneys and adrenal glands are normal in appearance. No hydroureteronephrosis. Urinary bladder is nearly completely decompressed, but otherwise unremarkable in appearance. Stomach/Bowel: The appearance of the stomach is normal. There is no pathologic dilatation of small bowel or colon. Numerous colonic diverticulae are noted, without surrounding inflammatory changes to suggest an acute diverticulitis at this time. Normal appendix. Vascular/Lymphatic: Aortic atherosclerosis, with vascular findings and measurements pertinent to potential TAVR procedure, as detailed below. No aneurysm or dissection noted in the abdominal or pelvic vasculature. No lymphadenopathy noted in the abdomen or pelvis. Reproductive: Status post hysterectomy. Ovaries are not confidently identified may be surgically absent or atrophic. Other: No significant volume of ascites.  No pneumoperitoneum. Musculoskeletal: There are no aggressive appearing lytic or blastic lesions noted in the visualized portions of the skeleton. VASCULAR MEASUREMENTS PERTINENT TO TAVR: AORTA: Minimal Aortic Diameter-13 x 12 mm Severity of Aortic Calcification-severe RIGHT PELVIS: Right Common Iliac Artery - Minimal Diameter-8.0 x 4.1 mm Tortuosity-mild Calcification-severe Right External Iliac Artery - Minimal Diameter-4.2 x 5.1 mm Tortuosity-mild-to-moderate Calcification-moderate to severe Right Common Femoral Artery -  Minimal Diameter-6.7 x 5.9 mm Tortuosity-mild Calcification-moderate LEFT PELVIS: Left Common Iliac Artery - Minimal Diameter-4.2 x 6.2 mm Tortuosity-mild Calcification-severe Left External Iliac Artery - Minimal Diameter-5.6 x 4.9 mm Tortuosity-mild-to-moderate Calcification-moderate to severe Left Common Femoral Artery - Minimal Diameter-5.2 x 4.7 mm Tortuosity-mild Calcification-mild Review of the MIP images confirms the above findings. IMPRESSION: 1. Vascular findings and measurements pertinent to potential TAVR procedure, as detailed above. 2. Severe thickening calcification of the aortic valve, compatible with reported clinical history of severe aortic stenosis. 3. Aortic atherosclerosis, in addition to left main and three-vessel coronary artery disease. 4. The appearance of the lungs suggests interstitial pulmonary edema. There also moderate bilateral pleural effusions and areas of passive atelectasis in the lower lobes of the lungs bilaterally. Given the normal left ventricular size, clinical correlation for signs and symptoms of diastolic heart failure is recommended. 5. Colonic diverticulosis without evidence of acute diverticulitis at this time. 6. Additional incidental findings, as above. Electronically Signed   By: Vinnie Langton M.D.   On: 07/29/2021 11:26     Medications:     Scheduled Medications:  aspirin EC  81 mg Oral Daily   atorvastatin  40 mg Oral Daily   chlorhexidine gluconate (MEDLINE KIT)  15 mL Mouth Rinse BID   Chlorhexidine Gluconate Cloth  6 each Topical Daily   docusate  100 mg Per Tube BID   insulin aspart  1-3 Units Subcutaneous Q4H   ipratropium-albuterol  3 mL Nebulization Q4H   mouth rinse  15 mL Mouth Rinse 10 times per day   montelukast  10 mg Per Tube QHS   pantoprazole (PROTONIX) IV  40 mg Intravenous Daily   polyethylene glycol  17 g Per Tube Daily   sodium chloride flush  3 mL Intravenous Q12H    Infusions:  sodium chloride     heparin 900 Units/hr  (07/26/21 0700)   nitroGLYCERIN 5 mcg/min (07/18/2021 1216)   norepinephrine (LEVOPHED) Adult infusion 2 mcg/min (07/26/21 0700)   propofol (DIPRIVAN) infusion Stopped (07/26/21 0640)    PRN Medications: acetaminophen, fentaNYL (SUBLIMAZE) injection, fentaNYL (SUBLIMAZE) injection, ipratropium-albuterol, midazolam, midazolam, ondansetron (ZOFRAN) IV    Patient Profile   76 y/o female smoker w/ severe PAD, COPD, Type 2DM and HLD admitted w/ NSTEMI w/ critical LM disease on cath, new systolic heart failure w/ low output, severe aortic stenosis, mod-severe MR. Transferred from [facility] for further management/ optimization of HF  and consultation for potential CABG + AVR vs PCI w/ TAVR.   Assessment/Plan   1. CAD - NSTEMI, HS trop Hs trop 3,727>>4,188>>4,188>>1,949 - LHC w/ critical, 95%, LM stenosis + 70% p-mLAD disease - IABP placed 1/25 for coronary perfusion, continues at 1:2  - Evaluated by CT surgery . Not CABG candidate. Too high risk given other commodities and poor nutritional status. - Only option is high risk PCI/TAVR w/ Impella support but poor access options due to severe PAD - continue heparin gtt - ASA + high dose statin - Off ? blocker (metoprolol) w/ low output  - Hgb 8.7.  May benefit from 1u RBC transfusion     2. Severe Aortic Stenosis  - mean gradient 34 mmHg and valve area of 0.44. EF 30-35% - Too high risk for SAVR  - ? BAV + Impella for high risk PCI/TAVR. Structural heart team following     3. Acute Systolic Heart Failure>> Low Output - Echo 2019, EF normal 60-65%, RV normal - EF now 30-35%, RV normal - ICM + valvular HD. Obstructive CAD w/ critical LM + LAD disease. Severe AS  - RHC w/ elevated filling pressures and low output, CI 1.97L/min/m2. - Now w/ IABP, 1:2  - Good response to IV Lasix. CXR today w/ pulmonary edema, give Lasix 80 mg IV  - Off ? blocker w/ low output - Holding Losartan w/ recent hypotension   4. CODE Stroke  - post IABP placement  1/25 - Head CT and CTA no evidence of CVA or LVO - EEG pending  - Suspect she may have had transient ischemia +/- hypercarbia due to high-flow O2 in cath lab. - Now following commands appropriately  - Appreciate Neurology's assistance   5. Hypomagnesemia - resolved w/ repletion, Mg 2.1 today    6. Hyponatremia - improving, 128>>132    7. COPD, Acute on Chronic Hypoxic Respiratory Failure - noted in history, long time smoker  - no record of PFTs in chart  - Intubated during CODE Stroke for airway protection - Vent management per PCCM    8. Tobacco Abuse - Smoking cessation imperative  - nicotine patch    9. HLD, LDL Goal < 70   - lipids at goal, LDL 49 mg/dL - continue atorvastatin 40    10. Type 2DM  - Hgb A1c 6.0  - SSI   11. Limited Code - No CPR, defib or code meds  Length of Stay: 2  Lyda Jester, PA-C  07/26/2021, 7:13 AM  Advanced Heart Failure Team Pager [phone] (M-F; 7a - 5p)  Please contact [facility] for night-coverage after hours (5p -7a ) and weekends on [email or web address]   Agree with above.   Remains on the vent. Remains on IABP and NE. Wide awake and communicative. Continues to c/o chest pressure. CXR remain wet. IABP low.   General:  Awake on vent communicative HEENT: normal Neck: supple. no JVD. Carotids 2+ bilat; no bruits. No lymphadenopathy or thryomegaly appreciated. Cor: PMI nondisplaced. 3/6S s2 obliterated Lungs: coarse Abdomen: soft, nontender, nondistended. No hepatosplenomegaly. No bruits or masses. Good bowel sounds. Extremities: no cyanosis, clubbing, rash, edema + RFA IABP  Neuro: awake on vent   She remains very tenuous.Continues to have ischemic type symptoms despite IABP support. Long talk with her and her family that medical therapy will likely not be successful given [place] at rest. That leaves Palliative Care vs high-risk PCI/BAV/TAVR.   Given severe lung disease, recent 100 pound weight loss, I think that  her QOL would  still be quite poor even if she had successful procedures. Favor comfort care and her family agrees.   Will try to get her extubated in the next day or two and discuss further with her. D/w CCM at bedside.   CRITICAL CARE Performed by: Glori Bickers  Total critical care time: 50 minutes  Critical care time was exclusive of separately billable procedures and treating other patients.  Critical care was necessary to treat or prevent imminent or life-threatening deterioration.  Critical care was time spent personally by me (independent of midlevel providers or residents) on the following activities: development of treatment plan with patient and/or surrogate as well as nursing, discussions with consultants, evaluation of patient's response to treatment, examination of patient, obtaining history from patient or surrogate, ordering and performing treatments and interventions, ordering and review of laboratory studies, ordering and review of radiographic studies, pulse oximetry and re-evaluation of patient's condition.   Glori Bickers, MD  5:02 PM

## 2021-07-26 NOTE — Progress Notes (Signed)
Initial Nutrition Assessment  DOCUMENTATION CODES:   Severe malnutrition in context of chronic illness  INTERVENTION:   Recommend Cortrak tube placement if TF is needed; hx of gastric bypass.   Tube Feeding  Recommendations:  Vital AF 1.2 at 20 ml/hr upon initiation; goal rate of 55 ml/hr Pro-Source TF 45 ML daily Provides 110 g of protein, 1624 kcals, 1069 mL of free water  Very high refeeding risk: Once nutrition initiation, monitor magnesium, potassium, and phosphorus BID for at least 3 days, MD to replete as needed   Give hx of gastric bypass and extreme wt loss with malnutrition, pt at risk for multiple vitamin deficiencies.   Add MVI with Minerals BID; recommend Bariatric MVI as outpatient  Add Thiamine 100 mg daily  NUTRITION DIAGNOSIS:   Severe Malnutrition related to chronic illness (gastric bypass, COPD, heart disease) as evidenced by severe fat depletion, severe muscle depletion.   GOAL:   Patient will meet greater than or equal to 90% of their needs  MONITOR:   Vent status, Weight trends, TF tolerance, Skin, Labs  REASON FOR ASSESSMENT:   Ventilator    ASSESSMENT:   76 yo female admitted with NSTEMI and severe aortic stenosis, acute CHF, acute respiratory failure requiring intubation. Pt with worsening chest pain post admission and emergent IABP performed. PMH includes mesenteric ischemia/ischemic colitis, gastric bypass with extreme wt loss, DM, HLD, COPD, HL, PAD with poor mobility.  1/25 IABP plced; Code stroke post cath; CT and CTA negative; Intubated  CT surgery consulted for possible AVR/CABG vs TAVR; pt not great surgical candidate.   Pt alert on vent, shakes head yes/no, utilizing pen and paper as well  Pt acknoledges significant wt loss over the past year; unable to identify exactly how much. Noted pt with hx of gastric bypass  OG tube in place; no abd xray   Noted weight of Current wt 60.2 kg (133 pounds).  Noted December 2021 weight of 73  kg weight weight of 88 kg earlier in 2021. It appears that pt used to weigh over 300 pounds.   Labs: sodium 132 (L), potassium 4.0 (wdl), phosphorus 5.5  Meds: ss novolog, lasix, colace, miralax  NUTRITION - FOCUSED PHYSICAL EXAM:  Flowsheet Row Most Recent Value  Orbital Region Severe depletion  Upper Arm Region Severe depletion  Thoracic and Lumbar Region Severe depletion  Buccal Region Unable to assess  Temple Region Severe depletion  Clavicle Bone Region Severe depletion  Clavicle and Acromion Bone Region Severe depletion  Scapular Bone Region Severe depletion  Dorsal Hand Moderate depletion  Patellar Region Unable to assess  Anterior Thigh Region Unable to assess  Posterior Calf Region Unable to assess  Edema (RD Assessment) None       Diet Order:   Diet Order             Diet NPO time specified  Diet effective now                   EDUCATION NEEDS:   Not appropriate for education at this time  Skin:  Skin Assessment: Skin Integrity Issues: Skin Integrity Issues:: Stage I Stage I: sacrum  Last BM:  1/24  Height:   Ht Readings from Last 1 Encounters:  07/05/2021 5\' 1"  (1.549 m)    Weight:   Wt Readings from Last 1 Encounters:  07/26/21 60.2 kg     BMI:  Body mass index is 25.08 kg/m.  Estimated Nutritional Needs:   Kcal:  1600-1800 kals  Protein:  90-120 g  Fluid:  >/= 1.5 L   Kerman Passey MS, RDN, LDN, CNSC Registered Dietitian III Clinical Nutrition RD Pager and On-Call Pager Number Located in [place]

## 2021-07-26 NOTE — Progress Notes (Addendum)
ANTICOAGULATION CONSULT NOTE  Pharmacy Consult for heparin Indication: IABP  Allergies  Allergen Reactions   Meloxicam Other (See Comments)    GI bleeding   Nsaids Other (See Comments)    Gi bleeding   Sitagliptin Hives and Itching    Januvia     Patient Measurements: Height: 5\' 1"  (154.9 cm) Weight: 60.2 kg (132 lb 11.5 oz) IBW/kg (Calculated) : 47.8 Heparin Dosing Weight: 60 kg  Vital Signs: Temp: 100.4 F (38 C) (01/26 1430) Temp Source: Esophageal (01/26 1200) BP: 129/46 (01/26 1430) Pulse Rate: 219 (01/26 1430)  Labs: Recent Labs    07/15/2021 1228 07/27/2021 2017 07/11/2021 0505 07/24/2021 1100 07/13/2021 1103 07/29/2021 1523 07/29/2021 1641 07/26/21 0437 07/26/21 1400  HGB 10.8*  --  8.7*  --   --   --  12.9 8.7*  --   HCT 32.8*  --  26.2*  --   --   --  38.0 26.4*  --   PLT 285  --  187  --   --   --   --  236  --   HEPARINUNFRC 0.42   < > 0.22* 0.51  --   --   --  <0.10* <0.10*  CREATININE 0.94  --  0.91  --   --  1.01*  --  0.98  --   TROPONINIHS  --   --   --   --  1,468* 1,949*  --   --   --    < > = values in this interval not displayed.     Estimated Creatinine Clearance: 42 mL/min (by C-G formula based on SCr of 0.98 mg/dL).   Medical History: Past Medical History:  Diagnosis Date   Acute on chronic diastolic CHF (congestive heart failure) ([place]) 07/04/2021   Allergy    seasonal allergies   Anemia 2014   needed 5 units of blood d/t passing out, weak   Anxiety    Arthritis    Asthma    allergy induced asthma   CAD (coronary artery disease)    a. 05/2018 MV: EF 82%, no ischemia/infarct; b. 07/2021 NSTEMI/Cath: LM 93m, LAD 70p/m, LCX min irregs, RCA 20ost/p. AoV mean grad of 80mmHg w/ AVA of 0.44. EF 25-35%.   Cardiac murmur 12/12/2017   Cataract    left   Complication of anesthesia    arrhythmia following colonoscopy   Degenerative disc disease, lumbar    Depression    Diabetes mellitus    Diabetic neuropathy ([place]) 11/16/2015   GERD  (gastroesophageal reflux disease)    H/O transfusion    patient was given 5 units of blood while at [place], blood type O+   HFrEF (heart failure with reduced ejection fraction) ([place])    a. 05/2018 Echo: EF 60-65%; b. 07/2021 Echo: EF 30-35%.   History of chicken pox    History of hiatal hernia    History of measles, mumps, or rubella    HOH (hard of hearing)    does not use hearing aides yet   Hyperlipidemia    Hypertension    Irregular heartbeat    Ischemic cardiomyopathy    a. 05/2018 Echo: EF 60-65%; b. 07/2021 Echo: EF 30-35%. GrII DD, sev mid-apical/anteroseptal/ant/apical HK.   Ischemic colitis ([place])    LVH (left ventricular hypertrophy) 12/24/2017   Echo Aug 2018   Mesenteric ischemia ([place])    a. 12/2019 s/p PTA/stenting of SMA w/ 6x29 balloon expandable VBX stent.   Moderate mitral regurgitation  Neuropathy    NSTEMI (non-ST elevated myocardial infarction) ([place]) 07/21/2021   Opiate use 11/16/2015   Peripheral arterial disease ([place])    a. 05/2018 s/p PTA/DCBA of L CFA; b. 11/2018 PTAR TP trunk and prox peroneal, DCBA R SFA & prox pop, Viabahn stenting to mid/distal R SFA; c. 05/2019 DCBA/stenting L SFA.   Pes planus of both feet 11/16/2015   Plantar fasciitis of right foot 11/16/2015   Pneumonia    Severe aortic stenosis    a. 05/2018 Echo: EF 60-65%, GrI DD, mild AS (mean grad 76mmHg, AVA 1.31cm^2); b. 07/2021 Echo: Ef 30-35%, mild LVH, GrII DD, sev mid-apical/anteroseptal/ant/apical HK. Nl RV fxn. Mildly dil LA. Mod MR. Sev AS (mean grad 34.110mmHg, AVA 0.64 cm^2).   Wheezing     Assessment: 76yo female was admitted to [facility] for NSTEMI, started on heparin and tx'd to [facility] after cath for further w/u by CVTS. IABP placed today in attempt to stabilize patient to allow CVTS evaluation. Code stroke was called in cath, and heparin infusion was stopped pending Neurology evaluation. Per discussion with Dr. Rory Percy, no stroke seen on CT and ok to resume heparin. Discussed with Dr.  Haroldine Laws - will resume heparin dosing per IABP protocol. Patient is not on anticoagulation PTA.  CBC stable WNL. Noted SCr 0.98. No bleed issues reported. She is currently on 900 units/hr and subtherapeutic at <0.1 twice after increasing from 700 units/hr. She was previously therapeutic at 0.24 around 1350 units. Ok with increasing heparin.  Goal of Therapy:  Heparin level 0.2-0.5 units/ml Monitor platelets by anticoagulation protocol: Yes   Plan:  Inc heparin to 1100 units/hr 2300 heparin level   Varney Daily, PharmD PGY1 Pharmacy Resident  Please check AMION for all [facility] pharmacy phone numbers After 10:00 PM call main pharmacy [phone]

## 2021-07-26 NOTE — Progress Notes (Signed)
° °  Remains awake on vent.   Diuresed modestly (1.1L) with IV lasix this am. However BP dropped and NE had to be increased. Now on 3mcg/min. (Had some augmentation alarms on IABP at that time as well).   Still with some chest heaviness.   IABP augmentation now stable.   Given severe AS and preload dependence will hold the evening dose of lasix.   Additional CCT  Arvilla Meres, MD  6:48 PM

## 2021-07-26 NOTE — Progress Notes (Addendum)
ANTICOAGULATION CONSULT NOTE  Pharmacy Consult for heparin Indication: IABP  Allergies  Allergen Reactions   Meloxicam Other (See Comments)    GI bleeding   Nsaids Other (See Comments)    Gi bleeding   Sitagliptin Hives and Itching    Januvia     Patient Measurements: Height: 5\' 1"  (154.9 cm) Weight: 62.7 kg (138 lb 3.7 oz) IBW/kg (Calculated) : 47.8 Heparin Dosing Weight: 60 kg  Vital Signs: Temp: 98.6 F (37 C) (01/26 0400) Temp Source: Esophageal (01/26 0400) BP: 116/50 (01/26 0400) Pulse Rate: 161 (01/26 0400)  Labs: Recent Labs    07/16/2021 1228 07/29/2021 2017 07/08/2021 0505 07/13/2021 1100 07/09/2021 1103 07/27/2021 1523 07/23/2021 1641 07/26/21 0437  HGB 10.8*  --  8.7*  --   --   --  12.9 8.7*  HCT 32.8*  --  26.2*  --   --   --  38.0 26.4*  PLT 285  --  187  --   --   --   --  236  HEPARINUNFRC 0.42   < > 0.22* 0.51  --   --   --  <0.10*  CREATININE 0.94  --  0.91  --   --  1.01*  --   --   TROPONINIHS  --   --   --   --  1,468* 1,949*  --   --    < > = values in this interval not displayed.     Estimated Creatinine Clearance: 41.5 mL/min (A) (by C-G formula based on SCr of 1.01 mg/dL (H)).   Medical History: Past Medical History:  Diagnosis Date   Acute on chronic diastolic CHF (congestive heart failure) ([place]) 07/05/2021   Allergy    seasonal allergies   Anemia 2014   needed 5 units of blood d/t passing out, weak   Anxiety    Arthritis    Asthma    allergy induced asthma   CAD (coronary artery disease)    a. 05/2018 MV: EF 82%, no ischemia/infarct; b. 07/2021 NSTEMI/Cath: LM 54m, LAD 70p/m, LCX min irregs, RCA 20ost/p. AoV mean grad of 70mmHg w/ AVA of 0.44. EF 25-35%.   Cardiac murmur 12/12/2017   Cataract    left   Complication of anesthesia    arrhythmia following colonoscopy   Degenerative disc disease, lumbar    Depression    Diabetes mellitus    Diabetic neuropathy ([place]) 11/16/2015   GERD (gastroesophageal reflux disease)    H/O  transfusion    patient was given 5 units of blood while at [place], blood type O+   HFrEF (heart failure with reduced ejection fraction) ([place])    a. 05/2018 Echo: EF 60-65%; b. 07/2021 Echo: EF 30-35%.   History of chicken pox    History of hiatal hernia    History of measles, mumps, or rubella    HOH (hard of hearing)    does not use hearing aides yet   Hyperlipidemia    Hypertension    Irregular heartbeat    Ischemic cardiomyopathy    a. 05/2018 Echo: EF 60-65%; b. 07/2021 Echo: EF 30-35%. GrII DD, sev mid-apical/anteroseptal/ant/apical HK.   Ischemic colitis ([place])    LVH (left ventricular hypertrophy) 12/24/2017   Echo Aug 2018   Mesenteric ischemia ([place])    a. 12/2019 s/p PTA/stenting of SMA w/ 6x29 balloon expandable VBX stent.   Moderate mitral regurgitation    Neuropathy    NSTEMI (non-ST elevated myocardial infarction) ([place]) 07/21/2021   Opiate  use 11/16/2015   Peripheral arterial disease ([place])    a. 05/2018 s/p PTA/DCBA of L CFA; b. 11/2018 PTAR TP trunk and prox peroneal, DCBA R SFA & prox pop, Viabahn stenting to mid/distal R SFA; c. 05/2019 DCBA/stenting L SFA.   Pes planus of both feet 11/16/2015   Plantar fasciitis of right foot 11/16/2015   Pneumonia    Severe aortic stenosis    a. 05/2018 Echo: EF 60-65%, GrI DD, mild AS (mean grad 68mmHg, AVA 1.31cm^2); b. 07/2021 Echo: Ef 30-35%, mild LVH, GrII DD, sev mid-apical/anteroseptal/ant/apical HK. Nl RV fxn. Mildly dil LA. Mod MR. Sev AS (mean grad 34.71mmHg, AVA 0.64 cm^2).   Wheezing     Assessment: 76yo female was admitted to [facility] for NSTEMI, started on heparin and tx'd to [facility] after cath for further w/u by CVTS. IABP placed today in attempt to stabilize patient to allow CVTS evaluation. Code stroke was called in cath, and heparin infusion was stopped pending Neurology evaluation. Per discussion with Dr. Rory Percy, no stroke seen on CT and ok to resume heparin. Discussed with Dr. Haroldine Laws - will resume heparin dosing per IABP  protocol. Patient is not on anticoagulation PTA.  CBC stable WNL. Noted rising SCr - up to 1.01 today. No bleed issues reported.  1/26 AM update:  Heparin level low  Goal of Therapy:  Heparin level 0.2-0.5 units/ml Monitor platelets by anticoagulation protocol: Yes   Plan:  Inc heparin to 900 units/hr 1300 heparin level   Narda Bonds, PharmD, BCPS Clinical Pharmacist Phone: [phone]

## 2021-07-27 ENCOUNTER — Inpatient Hospital Stay (HOSPITAL_COMMUNITY): Payer: Medicare Other

## 2021-07-27 ENCOUNTER — Encounter (HOSPITAL_COMMUNITY): Payer: Self-pay | Admitting: Internal Medicine

## 2021-07-27 DIAGNOSIS — E43 Unspecified severe protein-calorie malnutrition: Secondary | ICD-10-CM | POA: Insufficient documentation

## 2021-07-27 DIAGNOSIS — G9341 Metabolic encephalopathy: Secondary | ICD-10-CM

## 2021-07-27 DIAGNOSIS — Z66 Do not resuscitate: Secondary | ICD-10-CM

## 2021-07-27 DIAGNOSIS — Z515 Encounter for palliative care: Secondary | ICD-10-CM

## 2021-07-27 DIAGNOSIS — J9602 Acute respiratory failure with hypercapnia: Secondary | ICD-10-CM

## 2021-07-27 DIAGNOSIS — Z9889 Other specified postprocedural states: Secondary | ICD-10-CM

## 2021-07-27 LAB — GLUCOSE, CAPILLARY
Glucose-Capillary: 108 mg/dL — ABNORMAL HIGH (ref 70–99)
Glucose-Capillary: 110 mg/dL — ABNORMAL HIGH (ref 70–99)
Glucose-Capillary: 118 mg/dL — ABNORMAL HIGH (ref 70–99)
Glucose-Capillary: 150 mg/dL — ABNORMAL HIGH (ref 70–99)
Glucose-Capillary: 152 mg/dL — ABNORMAL HIGH (ref 70–99)
Glucose-Capillary: 153 mg/dL — ABNORMAL HIGH (ref 70–99)

## 2021-07-27 LAB — CBC
HCT: 26.3 % — ABNORMAL LOW (ref 36.0–46.0)
Hemoglobin: 8.6 g/dL — ABNORMAL LOW (ref 12.0–15.0)
MCH: 27 pg (ref 26.0–34.0)
MCHC: 32.7 g/dL (ref 30.0–36.0)
MCV: 82.4 fL (ref 80.0–100.0)
Platelets: 233 10*3/uL (ref 150–400)
RBC: 3.19 MIL/uL — ABNORMAL LOW (ref 3.87–5.11)
RDW: 14.9 % (ref 11.5–15.5)
WBC: 10.9 10*3/uL — ABNORMAL HIGH (ref 4.0–10.5)
nRBC: 0 % (ref 0.0–0.2)

## 2021-07-27 LAB — BASIC METABOLIC PANEL
Anion gap: 9 (ref 5–15)
Anion gap: 11 (ref 5–15)
BUN: 34 mg/dL — ABNORMAL HIGH (ref 8–23)
BUN: 30 mg/dL — ABNORMAL HIGH (ref 8–23)
CO2: 24 mmol/L (ref 22–32)
CO2: 26 mmol/L (ref 22–32)
Calcium: 8.8 mg/dL — ABNORMAL LOW (ref 8.9–10.3)
Calcium: 8.8 mg/dL — ABNORMAL LOW (ref 8.9–10.3)
Chloride: 99 mmol/L (ref 98–111)
Chloride: 93 mmol/L — ABNORMAL LOW (ref 98–111)
Creatinine, Ser: 0.98 mg/dL (ref 0.44–1.00)
Creatinine, Ser: 0.95 mg/dL (ref 0.44–1.00)
GFR, Estimated: >60 mL/min (ref >60)
GFR, Estimated: >60 mL/min (ref >60)
Glucose, Bld: 114 mg/dL — ABNORMAL HIGH (ref 70–99)
Glucose, Bld: 157 mg/dL — ABNORMAL HIGH (ref 70–99)
Potassium: 3.9 mmol/L (ref 3.5–5.1)
Potassium: 3.6 mmol/L (ref 3.5–5.1)
Sodium: 132 mmol/L — ABNORMAL LOW (ref 135–145)
Sodium: 130 mmol/L — ABNORMAL LOW (ref 135–145)

## 2021-07-27 LAB — HEPARIN LEVEL (UNFRACTIONATED)
Heparin Unfractionated: 0.2 IU/mL — ABNORMAL LOW (ref 0.30–0.70)
Heparin Unfractionated: 0.1 IU/mL — ABNORMAL LOW (ref 0.30–0.70)

## 2021-07-27 LAB — MAGNESIUM: Magnesium: 1.7 mg/dL (ref 1.7–2.4)

## 2021-07-27 MED ORDER — LIP MEDEX EX OINT
TOPICAL_OINTMENT | CUTANEOUS | Status: DC | PRN
  Filled 2021-07-27: qty 7

## 2021-07-27 MED ORDER — IPRATROPIUM-ALBUTEROL 0.5-2.5 (3) MG/3ML IN SOLN: 3.0000 mL | Freq: Two times a day (BID) | RESPIRATORY_TRACT | Status: DC

## 2021-07-27 MED ORDER — ENSURE ENLIVE PO LIQD
237.0000 mL | Freq: Two times a day (BID) | ORAL | Status: DC
  Administered 2021-07-27: 237 mL via ORAL

## 2021-07-27 MED ORDER — ORAL CARE MOUTH RINSE
15.0000 mL | Freq: Two times a day (BID) | OROMUCOSAL | Status: DC
  Administered 2021-07-27 – 2021-07-29 (×5): 15 mL via OROMUCOSAL

## 2021-07-27 MED ORDER — GERHARDT'S BUTT CREAM
TOPICAL_CREAM | Freq: Two times a day (BID) | CUTANEOUS | Status: DC
  Administered 2021-07-27 – 2021-07-29 (×3): 1 via TOPICAL
  Filled 2021-07-27: qty 1

## 2021-07-27 MED ORDER — POTASSIUM CHLORIDE 20 MEQ PO PACK
40.0000 meq | PACK | Freq: Once | ORAL | Status: AC
  Administered 2021-07-27: 40 meq
  Filled 2021-07-27: qty 2

## 2021-07-27 MED ORDER — IPRATROPIUM-ALBUTEROL 0.5-2.5 (3) MG/3ML IN SOLN
3.0000 mL | Freq: Two times a day (BID) | RESPIRATORY_TRACT | Status: DC
  Administered 2021-07-27 – 2021-07-30 (×6): 3 mL via RESPIRATORY_TRACT
  Filled 2021-07-27 (×6): qty 3

## 2021-07-27 MED ORDER — POTASSIUM CHLORIDE CRYS ER 20 MEQ PO TBCR: 40.0000 meq | EXTENDED_RELEASE_TABLET | Freq: Once | ORAL | Status: DC

## 2021-07-27 NOTE — Progress Notes (Signed)
ANTICOAGULATION CONSULT NOTE  Pharmacy Consult for heparin Indication: IABP  Allergies  Allergen Reactions   Meloxicam Other (See Comments)    GI bleeding   Nsaids Other (See Comments)    Gi bleeding   Sitagliptin Hives and Itching    Januvia     Patient Measurements: Height: 5\' 1"  (154.9 cm) Weight: 61.5 kg (135 lb 9.3 oz) IBW/kg (Calculated) : 47.8 Heparin Dosing Weight: 60 kg  Vital Signs: Temp: 96.6 F (35.9 C) (01/27 0800) Temp Source: Esophageal (01/27 0800) BP: 114/50 (01/27 0800) Pulse Rate: 217 (01/27 0715)  Labs: Recent Labs    07/23/2021 0505 07/05/2021 1100 07/23/2021 1103 07/02/2021 1523 07/14/2021 1641 07/26/21 0437 07/26/21 1400 07/26/21 2321 07/27/21 0502 07/27/21 0754  HGB 8.7*  --   --   --  12.9 8.7*  --   --  8.6*  --   HCT 26.2*  --   --   --  38.0 26.4*  --   --  26.3*  --   PLT 187  --   --   --   --  236  --   --  233  --   HEPARINUNFRC 0.22*   < >  --   --   --  <0.10* <0.10* 0.12*  --  0.20*  CREATININE 0.91  --   --  1.01*  --  0.98  --   --  0.98  --   TROPONINIHS  --   --  1,468* 1,949*  --   --   --   --   --   --    < > = values in this interval not displayed.     Estimated Creatinine Clearance: 42.4 mL/min (by C-G formula based on SCr of 0.98 mg/dL).   Medical History: Past Medical History:  Diagnosis Date   Acute on chronic diastolic CHF (congestive heart failure) ([place]) 07/13/2021   Allergy    seasonal allergies   Anemia 2014   needed 5 units of blood d/t passing out, weak   Anxiety    Arthritis    Asthma    allergy induced asthma   CAD (coronary artery disease)    a. 05/2018 MV: EF 82%, no ischemia/infarct; b. 07/2021 NSTEMI/Cath: LM 75m, LAD 70p/m, LCX min irregs, RCA 20ost/p. AoV mean grad of 61mmHg w/ AVA of 0.44. EF 25-35%.   Cardiac murmur 12/12/2017   Cataract    left   Complication of anesthesia    arrhythmia following colonoscopy   Degenerative disc disease, lumbar    Depression    Diabetes mellitus    Diabetic  neuropathy ([place]) 11/16/2015   GERD (gastroesophageal reflux disease)    H/O transfusion    patient was given 5 units of blood while at [place], blood type O+   HFrEF (heart failure with reduced ejection fraction) ([place])    a. 05/2018 Echo: EF 60-65%; b. 07/2021 Echo: EF 30-35%.   History of chicken pox    History of hiatal hernia    History of measles, mumps, or rubella    HOH (hard of hearing)    does not use hearing aides yet   Hyperlipidemia    Hypertension    Irregular heartbeat    Ischemic cardiomyopathy    a. 05/2018 Echo: EF 60-65%; b. 07/2021 Echo: EF 30-35%. GrII DD, sev mid-apical/anteroseptal/ant/apical HK.   Ischemic colitis ([place])    LVH (left ventricular hypertrophy) 12/24/2017   Echo Aug 2018   Mesenteric ischemia ([place])  a. 12/2019 s/p PTA/stenting of SMA w/ 6x29 balloon expandable VBX stent.   Moderate mitral regurgitation    Neuropathy    NSTEMI (non-ST elevated myocardial infarction) ([place]) 07/21/2021   Opiate use 11/16/2015   Peripheral arterial disease ([place])    a. 05/2018 s/p PTA/DCBA of L CFA; b. 11/2018 PTAR TP trunk and prox peroneal, DCBA R SFA & prox pop, Viabahn stenting to mid/distal R SFA; c. 05/2019 DCBA/stenting L SFA.   Pes planus of both feet 11/16/2015   Plantar fasciitis of right foot 11/16/2015   Pneumonia    Severe aortic stenosis    a. 05/2018 Echo: EF 60-65%, GrI DD, mild AS (mean grad 70mmHg, AVA 1.31cm^2); b. 07/2021 Echo: Ef 30-35%, mild LVH, GrII DD, sev mid-apical/anteroseptal/ant/apical HK. Nl RV fxn. Mildly dil LA. Mod MR. Sev AS (mean grad 34.70mmHg, AVA 0.64 cm^2).   Wheezing     Assessment: 76yo female was admitted to [facility] for NSTEMI, started on heparin and tx'd to [facility] after cath for further w/u by CVTS. IABP placed 1/25 in attempt to stabilize patient to allow CVTS evaluation. Heparin dosing per IABP protocol. Patient is not on anticoagulation PTA.  CBC stable WNL and SCr 0.98. No bleed issues reported. She is currently on 1300  units/hr and barely therapeutic at 0.2. She was previously therapeutic at 0.24 around 1350 units. With increasing heparin to from 700 to 1300, ok with leaving heparin at current dose and reassessing for PM confirmatory level. Only on [facility] for the IABP pump.  Goal of Therapy:  Heparin level 0.2-0.5 units/ml Monitor platelets by anticoagulation protocol: Yes   Plan:  Inc heparin to 1300 units/hr 1600 heparin level   Varney Daily, PharmD PGY1 Pharmacy Resident  Please check AMION for all [facility] pharmacy phone numbers After 10:00 PM call main pharmacy [phone]

## 2021-07-27 NOTE — Progress Notes (Signed)
NAME:  Andrea Holden, MRN:  [id number], DOB:  March 02, 1946, LOS: 3 ADMISSION DATE:  07/06/2021, CONSULTATION DATE:  Aug 04, 2021 REFERRING MD:  Wyline Mood, CHIEF COMPLAINT:  Acute respirtatory failure   History of Present Illness:  Andrea Holden is a 76 y.o. female who is seen in consultation at the request of Dr. Wyline Mood for recommendations on further evaluation and management of acute respiratory failure.   Patient presented to the [place] ED on 1/23 with a chief complaint of dyspnea, back pain, and NSTEMI, who was found to have LV dysfunction in the setting of sever aortic stenosis and critical left mitral disease  Pertinent past medical history of aortic stenosis, PAD s/p multiple LE PTAs/stents, mesenteric ischemia/ischemic colitis, s/p gastric bypass, HLD, DMII, COPD, active smoker.  On 1/25 went to the cath lab for placement of a IABP. During the procedure the patient became unresponsive, intubated. A code stroke was called.   PCCM was consulted for assistance  Pertinent  Medical History  Aortic stenosis, PAD s/p multiple LE PTAs/stents, mesenteric ischemia/ischemic colitis, s/p gastric bypass, HLD, DMII, COPD, active smoker  Significant Hospital Events: Including procedures, antibiotic start and stop dates in addition to other pertinent events   1/21 admitted to [facility] with NSTEMI 1/23 LHC  Interim History / Subjective:  Patient is stated no more chest pain or tightness, feeling better.  Wide-awake, following commands and tolerating spontaneous breathing trial  Objective   Blood pressure 117/78, pulse (!) 243, temperature 98.1 F (36.7 C), temperature source Oral, resp. rate 13, height 5\' 1"  (1.549 m), weight 61.5 kg, SpO2 90 %.    Vent Mode: PSV;CPAP FiO2 (%):  [30 %-40 %] 30 % Set Rate:  [15 bmp] 15 bmp Vt Set:  [380 mL] 380 mL PEEP:  [5 cmH20] 5 cmH20 Pressure Support:  [5 cmH20] 5 cmH20   Intake/Output Summary (Last 24 hours) at 07/27/2021 1116 Last data filed at  07/27/2021 1100 Gross per 24 hour  Intake 1142.66 ml  Output 3060 ml  Net -1917.34 ml   Filed Weights   04-Aug-2021 1600 07/26/21 0600 07/27/21 0620  Weight: 62.7 kg 60.2 kg 61.5 kg    Examination: General: Critically ill appearing elderly female, lying in the bed, orally intubated HEENT: [place]/AT, eyes anicteric Neuro: Awake, following commands, moving all extremities, writing to communicate. Comprehension intact. CV: S1S2, RRR, IABP.  PULM: diminished breath sounds bilaterally, no wheezing, no significant ETT secretions  GI: soft, NT Extremities: +clubbing, no cyanosis, mild peripheral edema. RLE IABP Skin:  warm, dry   Resolved Hospital Problem list     Assessment & Plan:  Acute respiratory failure with hypoxia and hypercapnia, suspect she had hypercapnia leading to metabolic encephalopathy  Acute cardiogenic pulmonary edema R>L pleural effusions COPD Active smoker Hypercapnia has cleared Patient mental status has improved, she is awake and following commands Tolerating spontaneous breathing trial Watch for respiratory distress On minimal sedation with Precedex with RASS goal 0 Continue gentle diuretics per heart failure team Duonebs Q4h  Acute metabolic encephalopathy related to hypercapnia Appreciate Neurology's management No focal deficit Patient mental status has improved  Severe aortic stenosis Moderate MR  Acute HFrEF with cardiogenic shock requiring inotropes and IABP CAD- severe 3 vessel disease Acute NSTEMI IABP management per cardiology Unfortunately not candidate for CABG Continue heparin Off vasopressors Continue aspirin and statin Diuretic management per heart failure team  DMII, controlled Fingersticks are well controlled, at goal Continue SSI  GERD Con't PPI  PAD HLD HX mesenteric ischemia, concern that  this could be related to recent severe weight loss Continue lipitor 40  Hyponatremia due to heart failure Serum sodium is improving,  currently at 132 Continue diuresis Monitor intake and output and electrolytes  Hyperphosphatemia Closely monitor  Severe protein calorie malnutrition-- reportedly has lost about 100# this year. Nutritionist is following Continue dietary supplements  Best Practice (right click and "Reselect all SmartList Selections" daily)   Diet/type: NPO w/ meds via tube DVT prophylaxis: SCD, pending head scan GI prophylaxis: PPI Lines: Arterial Line and yes and it is still needed Foley:  Yes, and it is still needed Code Status: DNR/DNI Last date of multidisciplinary goals of care discussion [ 1/27 ] patient and her daughter were updated at bedside, explained that after extubation she may struggle, in that case patient would not want to be reintubated and would like to be placed on comfort care.  Labs   CBC: Recent Labs  Lab 07/23/21 0430 07/13/2021 1228 07/06/2021 0505 07/31/2021 1641 07/26/21 0437 07/27/21 0502  WBC 9.3 16.3* 10.7*  --  12.3* 10.9*  HGB 9.2* 10.8* 8.7* 12.9 8.7* 8.6*  HCT 27.5* 32.8* 26.2* 38.0 26.4* 26.3*  MCV 79.0* 82.0 81.1  --  81.5 82.4  PLT 181 285 187  --  236 0000000    Basic Metabolic Panel: Recent Labs  Lab 07/23/21 0430 07/23/2021 1228 07/17/2021 0505 07/17/2021 1523 07/05/2021 1641 07/26/21 0437 07/27/21 0502  NA 127* 125* 128* 127* 157* 132* 132*  K 3.9 4.0 4.3 4.4 4.7 4.0 3.9  CL 94* 94* 95* 96*  --  98 99  CO2 25 23 23 23   --  25 24  GLUCOSE 111* 248* 123* 207*  --  113* 114*  BUN 24* 33* 33* 35*  --  36* 34*  CREATININE 0.68 0.94 0.91 1.01*  --  0.98 0.98  CALCIUM 8.6* 8.6* 8.7* 8.4*  --  8.8* 8.8*  MG 1.5*  --  1.7  --   --  2.1  --   PHOS  --   --   --   --   --  5.5*  --    GFR: Estimated Creatinine Clearance: 42.4 mL/min (by C-G formula based on SCr of 0.98 mg/dL). Recent Labs  Lab 07/23/2021 1228 07/05/2021 0505 07/26/21 0437 07/27/21 0502  WBC 16.3* 10.7* 12.3* 10.9*  LATICACIDVEN 1.2  --   --   --     Total critical care time: 46  minutes  Performed by: [place] care time was exclusive of separately billable procedures and treating other patients.   Critical care was necessary to treat or prevent imminent or life-threatening deterioration.   Critical care was time spent personally by me on the following activities: development of treatment plan with patient and/or surrogate as well as nursing, discussions with consultants, evaluation of patient's response to treatment, examination of patient, obtaining history from patient or surrogate, ordering and performing treatments and interventions, ordering and review of laboratory studies, ordering and review of radiographic studies, pulse oximetry and re-evaluation of patient's condition.   Jacky Kindle MD [place] [facility] See Amion for pager If no response to pager, please call [phone] until 7pm After 7pm, Please call E-link [phone]

## 2021-07-27 NOTE — Progress Notes (Signed)
Nutrition Follow-up  DOCUMENTATION CODES:   Severe malnutrition in context of chronic illness  INTERVENTION:   Recommend liberalizing pt diet to regular due to malnutrition. Received ok from MD.  Encourage good PO intake  Ensure Enlive po BID, each supplement provides 350 kcal and 20 grams of protein Continue MVI with Minerals BID; recommend Bariatric MVI as outpatient Continue Thiamine 100 mg daily  NUTRITION DIAGNOSIS:   Severe Malnutrition related to chronic illness (gastric bypass, COPD, heart disease) as evidenced by severe fat depletion, severe muscle depletion. - Ongoing  GOAL:   Patient will meet greater than or equal to 90% of their needs - Ongoing  MONITOR:   PO intake, Supplement acceptance, Labs, Weight trends  REASON FOR ASSESSMENT:   Ventilator    ASSESSMENT:   76 yo female admitted with NSTEMI and severe aortic stenosis, acute CHF, acute respiratory failure requiring intubation. Pt with worsening chest pain post admission and emergent IABP performed. PMH includes mesenteric ischemia/ischemic colitis, gastric bypass with extreme wt loss, DM, HLD, COPD, HL, PAD with poor mobility.  1/25 - IABP plced; Code stroke post cath; CT and CTA negative; Intubated 1/27 - Extubated; diet advanced to HH/CM   Pt just extubated at time of RD visit. Family at bedside.   Pt passed swallow eval.    Micronutrient labs still pending.  Medications reviewed and include: Colace, Lasix, SSI 1-3 units q4h, MVI, Protonix, Miralax, Thiamine Labs reviewed:  - Sodium 132 - BUN 34  Diet Order:   Diet Order             Diet regular Room service appropriate? Yes; Fluid consistency: Thin  Diet effective now                   EDUCATION NEEDS:   No education needs have been identified at this time  Skin:  Skin Assessment: Skin Integrity Issues: Skin Integrity Issues:: Stage I Stage I: sacrum  Last BM:  1/24  Height:   Ht Readings from Last 1 Encounters:  07/28/2021  5\' 1"  (1.549 m)    Weight:   Wt Readings from Last 1 Encounters:  07/27/21 61.5 kg    BMI:  Body mass index is 25.62 kg/m.  Estimated Nutritional Needs:   Kcal:  1800-2000  Protein:  90-120 g  Fluid:  >/= 1.8 L   [place] Casa, RD, LDN Clinical Dietitian See [facility] for contact information.

## 2021-07-27 NOTE — Plan of Care (Signed)
°  Problem: Education: Goal: Knowledge of General Education information will improve Description: Including pain rating scale, medication(s)/side effects and non-pharmacologic comfort measures Outcome: Progressing   Problem: Health Behavior/Discharge Planning: Goal: Ability to manage health-related needs will improve Outcome: Progressing   Problem: Clinical Measurements: Goal: Ability to maintain clinical measurements within normal limits will improve Outcome: Progressing Goal: Will remain free from infection Outcome: Progressing Goal: Diagnostic test results will improve Outcome: Progressing Goal: Respiratory complications will improve Outcome: Progressing Note: Extubated to 3L [place] this AM  Goal: Cardiovascular complication will be avoided Outcome: Progressing   Problem: Nutrition: Goal: Adequate nutrition will be maintained Outcome: Progressing   Problem: Coping: Goal: Level of anxiety will decrease Outcome: Progressing   Problem: Pain Managment: Goal: General experience of comfort will improve Outcome: Progressing   Problem: Skin Integrity: Goal: Risk for impaired skin integrity will decrease Outcome: Progressing

## 2021-07-27 NOTE — Progress Notes (Signed)
PT Cancellation Note  Patient Details Name: Andrea Holden MRN: [id number] DOB: 1945-11-04   Cancelled Treatment:    Reason Eval/Treat Not Completed: Patient not medically ready (pt remains with right fem IABP and not yet appropriate for mobility. will hold over the weekend and check for medical stability 1/30)   Yared Barefoot B Amarra Sawyer 07/27/2021, 1:01 PM Merryl Hacker, PT Acute Rehabilitation Services Pager: [phone] Office: [phone]

## 2021-07-27 NOTE — Procedures (Signed)
Extubation Procedure Note  Patient Details:   Name: Andrea Holden DOB: 02-19-1946 MRN: [id number]   Airway Documentation:    Vent end date: 07/27/21 Vent end time: 1040   Evaluation  O2 sats: stable throughout Complications: No apparent complications Patient did tolerate procedure well. Bilateral Breath Sounds: Diminished   Yes  Dewain Penning T 07/27/2021, 11:04 AM

## 2021-07-27 NOTE — Progress Notes (Addendum)
Advanced Heart Failure Rounding Note  PCP-Cardiologist: Ida Rogue, MD   Subjective:    1/25: IABP placed. Developed Code Stroke after cath. Head CT and CTA no evidence of CVA or LVO. Intubated for airway protection.  1/26: NE added back   NE weaned down to 2 overnight. BP stable.  Remains intubated. FIO2 30%. Attempting weaning trial this am. She is awake and following commands  Hs trop 3,727>>4,188>1,468>>1,949  Reporting chest heaviness this am.  Diuresed with IV lasix yesterday. Negative ~ 1L. Held evening dose.  Objective:   Weight Range: 61.5 kg Body mass index is 25.62 kg/m.   Vital Signs:   Temp:  [96.6 F (35.9 C)-100.4 F (38 C)] 96.6 F (35.9 C) (01/27 0800) Pulse Rate:  [72-242] 217 (01/27 0715) Resp:  [12-23] 15 (01/27 0800) BP: (97-150)/(39-102) 114/50 (01/27 0800) SpO2:  [90 %-100 %] 96 % (01/27 0804) FiO2 (%):  [30 %-40 %] 30 % (01/27 0804) Weight:  [61.5 kg] 61.5 kg (01/27 0620) Last BM Date: 07/12/2021  Weight change: Filed Weights   07/15/2021 1600 07/26/21 0600 07/27/21 0620  Weight: 62.7 kg 60.2 kg 61.5 kg    Intake/Output:   Intake/Output Summary (Last 24 hours) at 07/27/2021 0814 Last data filed at 07/27/2021 0800 Gross per 24 hour  Intake 919.47 ml  Output 1860 ml  Net -940.53 ml      Physical Exam    General:  Elderly, frail WF, on vent HEENT:  + ETT Neck: supple. + JVD. Carotids 2+ bilat; no bruits.  Cor: PMI nondisplaced. Regular rate & rhythm. No rubs, gallops, 3/6 HSM with late peak Lungs: clear Abdomen: soft, nontender, nondistended. No hepatosplenomegaly.  Extremities: no cyanosis, clubbing, rash, edema, R fem IABP, has dopplerable pulses Neuro:Awake on vent, following commands. moves all 4 extremities w/o difficulty.    Telemetry   NSR 70s  Labs    CBC Recent Labs    07/26/21 0437 07/27/21 0502  WBC 12.3* 10.9*  HGB 8.7* 8.6*  HCT 26.4* 26.3*  MCV 81.5 82.4  PLT 236 510   Basic Metabolic  Panel Recent Labs    07/23/2021 0505 07/17/2021 1523 07/26/21 0437 07/27/21 0502  NA 128*   < > 132* 132*  K 4.3   < > 4.0 3.9  CL 95*   < > 98 99  CO2 23   < > 25 24  GLUCOSE 123*   < > 113* 114*  BUN 33*   < > 36* 34*  CREATININE 0.91   < > 0.98 0.98  CALCIUM 8.7*   < > 8.8* 8.8*  MG 1.7  --  2.1  --   PHOS  --   --  5.5*  --    < > = values in this interval not displayed.   Liver Function Tests Recent Labs    07/26/21 0437  AST 49*  ALT 34  ALKPHOS 80  BILITOT 0.6  PROT 5.5*  ALBUMIN 2.8*   No results for input(s): LIPASE, AMYLASE in the last 72 hours. Cardiac Enzymes No results for input(s): CKTOTAL, CKMB, CKMBINDEX, TROPONINI in the last 72 hours.  BNP: BNP (last 3 results) Recent Labs    07/21/21 2329  BNP 2,354.7*    ProBNP (last 3 results) No results for input(s): PROBNP in the last 8760 hours.   D-Dimer No results for input(s): DDIMER in the last 72 hours. Hemoglobin A1C No results for input(s): HGBA1C in the last 72 hours. Fasting Lipid Panel Recent Labs  07/10/2021 0505 07/26/21 0437  CHOL 103  --   HDL 41  --   LDLCALC 48  --   TRIG 70 85  CHOLHDL 2.5  --    Thyroid Function Tests No results for input(s): TSH, T4TOTAL, T3FREE, THYROIDAB in the last 72 hours.  Invalid input(s): FREET3  Other results:   Imaging    DG CHEST PORT 1 VIEW  Result Date: 07/26/2021 CLINICAL DATA:  Intra-aortic balloon pump adjustment. EXAM: PORTABLE CHEST 1 VIEW COMPARISON:  Chest radiograph 07/26/2021 at 11:37 a.m. FINDINGS: An endotracheal tube terminates 5 cm above the carina. The distal marker of an intra-balloon pump has been slightly retracted and now projects at the level of the proximal descending thoracic aorta, just below the aortic knob. An enteric tube courses into the abdomen. The cardiomediastinal silhouette is unchanged. Bilateral interstitial and hazy airspace opacities and small pleural effusions are unchanged. No pneumothorax is identified.  IMPRESSION: 1. Slight interval retraction of intra-balloon pump, now projecting over the proximal descending thoracic aorta. 2. Unchanged pulmonary edema and pleural effusions. Electronically Signed   By: Logan Bores M.D.   On: 07/26/2021 12:18   DG CHEST PORT 1 VIEW  Result Date: 07/26/2021 CLINICAL DATA:  Intra-aortic balloon pump adjustment. EXAM: PORTABLE CHEST 1 VIEW COMPARISON:  Chest radiograph 07/26/2021 at 10:11 a.m. FINDINGS: Endotracheal tube terminates 4.5-5 cm above the carina. Intra-aortic balloon pump marker projects over the aortic knob. An enteric tube courses into the abdomen with tip not imaged. The cardiomediastinal silhouette is unchanged. Interstitial and hazy airspace opacities in the mid and lower lungs bilaterally and small right and likely small left pleural effusions are unchanged. No pneumothorax is identified. IMPRESSION: 1. Intra-aortic balloon pump marker projects over the aortic knob. This has been further adjusted on a subsequent radiograph. 2. Unchanged pulmonary edema and pleural effusions. Electronically Signed   By: Logan Bores M.D.   On: 07/26/2021 12:17   DG CHEST PORT 1 VIEW  Result Date: 07/26/2021 CLINICAL DATA:  Intra-aortic balloon assist pump. History of congestive heart failure and heart murmur. EXAM: PORTABLE CHEST 1 VIEW COMPARISON:  Earlier today. FINDINGS: Endotracheal tube in satisfactory position. Nasogastric tube tip and side hole in the proximal stomach. The intra-aortic balloon pump marker is no longer visualized in the chest. This may be in the upper abdomen to the right of the spine, possibly at the location of focal aortic tortuosity on the chest, abdomen and pelvis CT obtained yesterday. Mild increase in prominence of the interstitial markings with Kerley lines. Resolved bilateral airspace opacities with the exception of mild airspace opacity remaining at the right lung base and minimal opacity at the left lung base. Probable small right pleural  effusion without significant change. Normal sized heart. No acute bony abnormality. IMPRESSION: 1. Findings suggesting retraction of the intra-aortic balloon pump into the abdominal aorta, as described above. 2. Improved bilateral alveolar edema with increased interstitial pulmonary edema. These results will be called to the ordering clinician or representative by the Radiologist Assistant, and communication documented in the PACS or [employer]. Electronically Signed   By: Claudie Revering M.D.   On: 07/26/2021 10:39     Medications:     Scheduled Medications:  aspirin  81 mg Per Tube Daily   atorvastatin  40 mg Per Tube Daily   chlorhexidine gluconate (MEDLINE KIT)  15 mL Mouth Rinse BID   Chlorhexidine Gluconate Cloth  6 each Topical Daily   docusate  100 mg Per Tube BID  furosemide  80 mg Intravenous BID   insulin aspart  1-3 Units Subcutaneous Q4H   ipratropium-albuterol  3 mL Nebulization Q4H   mouth rinse  15 mL Mouth Rinse 10 times per day   montelukast  10 mg Per Tube QHS   multivitamin with minerals  1 tablet Per Tube BID   pantoprazole (PROTONIX) IV  40 mg Intravenous Daily   polyethylene glycol  17 g Per Tube Daily   sodium chloride flush  3 mL Intravenous Q12H   thiamine  100 mg Per Tube Daily    Infusions:  sodium chloride     dexmedetomidine (PRECEDEX) IV infusion 0.1 mcg/kg/hr (07/27/21 0800)   heparin 1,300 Units/hr (07/27/21 0800)   nitroGLYCERIN 5 mcg/min (07/03/2021 1216)   norepinephrine (LEVOPHED) Adult infusion 2 mcg/min (07/27/21 0800)    PRN Medications: acetaminophen (TYLENOL) oral liquid 160 mg/5 mL, fentaNYL (SUBLIMAZE) injection, fentaNYL (SUBLIMAZE) injection, midazolam, midazolam, ondansetron (ZOFRAN) IV    Patient Profile   76 y/o female smoker w/ severe PAD, COPD, Type 2DM and HLD admitted w/ NSTEMI w/ critical LM disease on cath, new systolic heart failure w/ low output, severe aortic stenosis, mod-severe MR. Transferred from [facility] for further  management/ optimization of HF and consultation for potential CABG + AVR vs PCI w/ TAVR.   Assessment/Plan   1. CAD - NSTEMI, HS trop Hs trop 3,727>>4,188>>4,188>>1,949 - LHC w/ critical, 95%, LM stenosis + 70% p-mLAD disease - IABP placed 1/25 for coronary perfusion, continues at 1:1 - Evaluated by CT surgery . Not CABG candidate. Too high risk given other comorbidities and poor nutritional status.  - Only option is high risk PCI/TAVR w/ Impella support but poor access options due to severe PAD - Still having some chest heaviness - continue heparin gtt - ASA + high dose statin - Off ? blocker (metoprolol) w/ low output  - Hgb 8.6.    2. Severe Aortic Stenosis  - mean gradient 34 mmHg and valve area of 0.44. EF 30-35% - Too high risk for SAVR  - ? BAV + Impella for high risk PCI/TAVR. Structural heart team following    3. Acute Systolic Heart Failure>> Low Output - Echo 2019, EF normal 60-65%, RV normal - EF now 30-35%, RV normal - ICM + valvular HD. Obstructive CAD w/ critical LM + LAD disease. Severe AS  - RHC w/ elevated filling pressures and low output, CI 1.97L/min/m2. - Now w/ IABP, 1:1 - Able to wean NE down to 2 overnight - Modest response to 1 dose IV lasix yesterday. Appears volume overloaded. Continue IV lasix today. Supp K. - Off ? blocker w/ low output - Holding Losartan w/ recent hypotension   4. CODE Stroke  - post IABP placement 1/25 - Head CT and CTA no evidence of CVA or LVO - EEG pending  - Suspect she may have had transient ischemia +/- hypercarbia due to high-flow O2 in cath lab. - Now following commands appropriately  - Appreciate Neurology's assistance   5. Hypomagnesemia - resolved w/ repletion, Mg 2.1 yesterday   6. Hyponatremia - improving, 128>>132>>132   7. COPD, Acute on Chronic Hypoxic Respiratory Failure - noted in history, long time smoker  - no record of PFTs in chart  - Intubated during CODE Stroke for airway protection - Vent  management per PCCM. Attempting to wean from vent today   8. Tobacco Abuse - Smoking cessation imperative  - nicotine patch    9. HLD, LDL Goal < 70   -  lipids at goal, LDL 49 mg/dL - continue atorvastatin 40    10. Type 2DM  - Hgb A1c 6.0  - SSI   11. Limited Code - No CPR, defib or code meds   Weaning to extubate today. Continues to have angina despite IABP support. Medical therapy would be difficult given [place] at rest. Options include palliative care vs high-risk PCI/BAV/TAVR.    To complicate things, has severe pulmonary disease and recent 100 lb weight loss. Long-term prognosis likely poor even if has successful procedures.   Comfort care discussed with family yesterday. Will need to continue goals of care discussion today once family arrives.   Length of Stay: 3  FINCH, LINDSAY N, PA-C  07/27/2021, 8:14 AM  Advanced Heart Failure Team Pager [phone] (M-F; 7a - 5p)  Please contact [facility] for night-coverage after hours (5p -7a ) and weekends on [email or web address]  Agree with above.   Patient seen on am ICU rounds.   Remains awake on vent. On IABP 1:1 and NE 2. (Down from 69). Still with chest pressure. Diuresed 1.1L. Lasix held due to low BP  General:  Awake on vent HEENT: normal +ETT Neck: supple. no JVD. Carotids 2+ bilat; no bruits. No lymphadenopathy or thryomegaly appreciated. Cor: PMI nondisplaced. Regular rate & rhythm. 2/6 AS s2 obliterated Lungs: course Abdomen: soft, nontender, nondistended. No hepatosplenomegaly. No bruits or masses. Good bowel sounds. Extremities: no cyanosis, clubbing, rash, edema IABP in RFA Neuro: on vent communicating with writing  She has stabilized overnight but remains end-stage. Will plan to try and extubate her later today with understanding that this would likely be one-way extubation.   Extensive discussions with family yesterday and favor palliative approach and would not want to pursue aggressive attempts with BAV -> high  risk LM/LCx PCI -> TAVR given severe comorbidities and lack of access for Impella support   We also discussed that conservative medical therapy may not be an option given resting angina with AS and high-grade LM disease. Suspect comfort care is best option.   CRITICAL CARE Performed by: Glori Bickers  Total critical care time: 45 minutes  Critical care time was exclusive of separately billable procedures and treating other patients.  Critical care was necessary to treat or prevent imminent or life-threatening deterioration.  Critical care was time spent personally by me (independent of midlevel providers or residents) on the following activities: development of treatment plan with patient and/or surrogate as well as nursing, discussions with consultants, evaluation of patient's response to treatment, examination of patient, obtaining history from patient or surrogate, ordering and performing treatments and interventions, ordering and review of laboratory studies, ordering and review of radiographic studies, pulse oximetry and re-evaluation of patient's condition.  Glori Bickers, MD  4:50 PM

## 2021-07-27 NOTE — Progress Notes (Signed)
°  Patient extubated this am.   Now off pressors. Remains on IABP 1:1 and heparin gtt.   Breathing better after diuresis. Denies CP currently .  Has a friend and family coming in over the weekend and wants to leave IABP in until Monday so she can interact with them.   Will plan to pull IABP and switch to comfort care on Monday am.   Keep diuresed over the weekend. If deteriorates over weekend will switch to comfort sooner.   Discussed with patient and her daughter and family friend at bedside.   Additional CCT 30 mins.   Glori Bickers, MD  6:42 PM

## 2021-07-27 NOTE — Progress Notes (Signed)
ANTICOAGULATION CONSULT NOTE  Pharmacy Consult for heparin Indication: IABP  Allergies  Allergen Reactions   Meloxicam Other (See Comments)    GI bleeding   Nsaids Other (See Comments)    Gi bleeding   Sitagliptin Hives and Itching    Januvia     Patient Measurements: Height: 5\' 1"  (154.9 cm) Weight: 61.5 kg (135 lb 9.3 oz) IBW/kg (Calculated) : 47.8 Heparin Dosing Weight: 60 kg  Vital Signs: Temp: 98.8 F (37.1 C) (01/27 1603) Temp Source: Oral (01/27 1603) BP: 133/47 (01/27 1600) Pulse Rate: 84 (01/27 1135)  Labs: Recent Labs    07/02/2021 0505 07/06/2021 1100 07/12/2021 1103 07/17/2021 1523 07/13/2021 1641 07/26/21 0437 07/26/21 1400 07/26/21 2321 07/27/21 0502 07/27/21 0754 07/27/21 1547  HGB 8.7*  --   --   --  12.9 8.7*  --   --  8.6*  --   --   HCT 26.2*  --   --   --  38.0 26.4*  --   --  26.3*  --   --   PLT 187  --   --   --   --  236  --   --  233  --   --   HEPARINUNFRC 0.22*   < >  --   --   --  <0.10*   < > 0.12*  --  0.20* 0.10*  CREATININE 0.91  --   --  1.01*  --  0.98  --   --  0.98  --   --   TROPONINIHS  --   --  1,468* 1,949*  --   --   --   --   --   --   --    < > = values in this interval not displayed.     Estimated Creatinine Clearance: 42.4 mL/min (by C-G formula based on SCr of 0.98 mg/dL).   Medical History: Past Medical History:  Diagnosis Date   Acute on chronic diastolic CHF (congestive heart failure) ([place]) 07/29/2021   Allergy    seasonal allergies   Anemia 2014   needed 5 units of blood d/t passing out, weak   Anxiety    Arthritis    Asthma    allergy induced asthma   CAD (coronary artery disease)    a. 05/2018 MV: EF 82%, no ischemia/infarct; b. 07/2021 NSTEMI/Cath: LM 55m, LAD 70p/m, LCX min irregs, RCA 20ost/p. AoV mean grad of 30mmHg w/ AVA of 0.44. EF 25-35%.   Cardiac murmur 12/12/2017   Cataract    left   Complication of anesthesia    arrhythmia following colonoscopy   Degenerative disc disease, lumbar     Depression    Diabetes mellitus    Diabetic neuropathy ([place]) 11/16/2015   GERD (gastroesophageal reflux disease)    H/O transfusion    patient was given 5 units of blood while at [place], blood type O+   HFrEF (heart failure with reduced ejection fraction) ([place])    a. 05/2018 Echo: EF 60-65%; b. 07/2021 Echo: EF 30-35%.   History of chicken pox    History of hiatal hernia    History of measles, mumps, or rubella    HOH (hard of hearing)    does not use hearing aides yet   Hyperlipidemia    Hypertension    Irregular heartbeat    Ischemic cardiomyopathy    a. 05/2018 Echo: EF 60-65%; b. 07/2021 Echo: EF 30-35%. GrII DD, sev mid-apical/anteroseptal/ant/apical HK.   Ischemic colitis ([place])  LVH (left ventricular hypertrophy) 12/24/2017   Echo Aug 2018   Mesenteric ischemia ([place])    a. 12/2019 s/p PTA/stenting of SMA w/ 6x29 balloon expandable VBX stent.   Moderate mitral regurgitation    Neuropathy    NSTEMI (non-ST elevated myocardial infarction) ([place]) 07/21/2021   Opiate use 11/16/2015   Peripheral arterial disease ([place])    a. 05/2018 s/p PTA/DCBA of L CFA; b. 11/2018 PTAR TP trunk and prox peroneal, DCBA R SFA & prox pop, Viabahn stenting to mid/distal R SFA; c. 05/2019 DCBA/stenting L SFA.   Pes planus of both feet 11/16/2015   Plantar fasciitis of right foot 11/16/2015   Pneumonia    Severe aortic stenosis    a. 05/2018 Echo: EF 60-65%, GrI DD, mild AS (mean grad 74mmHg, AVA 1.31cm^2); b. 07/2021 Echo: Ef 30-35%, mild LVH, GrII DD, sev mid-apical/anteroseptal/ant/apical HK. Nl RV fxn. Mildly dil LA. Mod MR. Sev AS (mean grad 34.47mmHg, AVA 0.64 cm^2).   Wheezing     Assessment: 76yo female was admitted to [facility] for NSTEMI, started on heparin and tx'd to [facility] after cath for further w/u by CVTS. IABP placed 1/25 in attempt to stabilize patient to allow CVTS evaluation. Heparin dosing per IABP protocol. Patient is not on anticoagulation PTA.  Heparin level dropped to 0.1, currently  on 1300 units/hr. CBC stable. No bleeding or issues with infusion per discussion with RN.  Goal of Therapy:  Heparin level 0.2-0.5 units/ml Monitor platelets by anticoagulation protocol: Yes   Plan:  Increase heparin to 1400 units/hr Check 8hr heparin level Monitor daily CBC, s/sx bleeding   Arturo Morton, PharmD, BCPS Please check AMION for all [facility] contact numbers Clinical Pharmacist 07/27/2021 4:56 PM

## 2021-07-27 NOTE — Consult Note (Signed)
Consultation Note Date: 07/27/2021   Patient Name: Andrea Holden  DOB: 22-May-1946  MRN: [id number]  Age / Sex: 76 y.o., female  PCP: Andrea Merles, MD Referring Physician: Janina Mayo, MD  Reason for Consultation: Establishing goals of care "severe coronary artery disease and valvulopathy not a surgical candidate"  HPI/Patient Profile: 76 y.o. female  with past medical history of aortic stenosis, PAD s/p multiple lower extremity PTAs/stents, mesenteric ischemia/ischemic colitis, s/p gastric bypass, HLD, DMII, COPD, and active smoker,  She presented to [facility] emergency department on 07/23/21 with complaint of dyspnea and back pain. She was admitted with NSTEMI, went for a R/LHC and was found to have LV dysfunction in the setting of severe aortic stenosis and critical left main stenosis. She was transferred to [facility] on 07/18/2021 for further management of HF and consultation for potential CABG + AVR versus PCI with TAVR.  On 1/25, she went to the cath lab for placement of IABP. During the procedure she became unresponsive and was intubated. Code stroke was called but head CT/CTA showed no evidence of CVA or LVO.   Clinical Assessment and Goals of Care: I have reviewed medical records including EPIC notes, labs and imaging, and received updates from the bedside RN. Patient was extubated this morning and has been doing well on 3L nasal cannula. She remains on IABP.  I met at bedside with patient and her daughter Andrea Holden to discuss diagnosis, prognosis, GOC, EOL wishes, disposition, and options. Patient is alert and tell me she feels great, but knows this is temporary. She is eating a cheeseburger. She has several friends present and has been enjoying visiting with them today.   I introduced Palliative Medicine as specialized medical care for people living with serious illness. It focuses on providing relief from the  symptoms and stress of a serious illness.   We discussed a brief life review of the patient. Andrea Holden is originally from [place].  She relocated to [facility] about 17 years ago. 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 Andrea Holden decided to come with them, so that she could watch her grandchildren grow up.  As far as functional status prior to admission, Andrea Holden lived alone in her home in [place]. She was fully independent.   We discussed her current illness and what it means in the larger context of her ongoing co-morbidities. Andrea Holden verbalizes understanding of the severity of her illness. She understands she has severe coronary artery disease and severe aortic stenosis, with limited options for treatment.   I attempted to elicit values and goals of care important to the patient. Andrea Holden and Andrea Holden speak to the importance of quality of life. Andrea Holden shares that she has discussed with her mother that she is very high risk for TAVR surgery, and that if she survived the procedure she would have a very long/difficult recovery and would not return to baseline. Andrea Holden is clear that this would not be consistent with  good quality of life for her.   The difference between full scope medical intervention and comfort care was considered.  Reviewed the concept of a comfort path with patient and family emphasizing that this path involves de-escalating and stopping full scope medical interventions, allowing a natural course to occur. Discussed that the goal is comfort and dignity rather than cure/prolonging life.   Andrea Holden fully understands and tell me she is "ready". She also shares that she is strong in her faith and finds comfort in knowing "where she is going". She and Andrea Holden have discussed the process/details of comfort care, and she understands that she will receive medicine for pain and dyspnea. She also understands that the IABP would be turned  off.   Questions and concerns were addressed.  The patient/family was encouraged to reach out with questions or concerns.    Primary decision maker: Patient    SUMMARY OF RECOMMENDATIONS   Continue current care for now Patient does not wish to proceed with TAVR When patient/family are ready, will transition to comfort care and IABP will be turned off Please continue to liberalize visitation in the meantime PMT will continue to support  Code Status/Advance Care Planning: DNR   Prognosis:  Poor overall  Discharge Planning: To Be Determined      Primary Diagnoses: Present on Admission:  NSTEMI (non-ST elevated myocardial infarction) ([name])   I have reviewed the medical record, interviewed the patient and family, and examined the patient. The following aspects are pertinent.  Past Medical History:  Diagnosis Date   Acute on chronic diastolic CHF (congestive heart failure) ([place]) 07/06/2021   Allergy    seasonal allergies   Anemia 2014   needed 5 units of blood d/t passing out, weak   Anxiety    Arthritis    Asthma    allergy induced asthma   CAD (coronary artery disease)    a. 05/2018 MV: EF 82%, no ischemia/infarct; b. 07/2021 NSTEMI/Cath: LM 65m LAD 70p/m, LCX min irregs, RCA 20ost/p. AoV mean grad of 339mg w/ AVA of 0.44. EF 25-35%.   Cardiac murmur 12/12/2017   Cataract    left   Complication of anesthesia    arrhythmia following colonoscopy   Degenerative disc disease, lumbar    Depression    Diabetes mellitus    Diabetic neuropathy ([facility]05/18/2017   GERD (gastroesophageal reflux disease)    H/O transfusion    [place]   HFrEF (heart failure with reduced ejection fraction) ([place]   a. 05/2018 Echo: EF 60-65%; b. 07/2021 Echo: EF 30-35%.   History of chicken pox    History of hiatal hernia    History of measles, mumps, or rubella    HOH (hard of hearing)    does not use hearing aides yet    Hyperlipidemia    Hypertension    Irregular heartbeat    Ischemic cardiomyopathy    a. 05/2018 Echo: EF 60-65%; b. 07/2021 Echo: EF 30-35%. GrII DD, sev mid-apical/anteroseptal/ant/apical HK.   Ischemic colitis ([place]   LVH (left ventricular hypertrophy) 12/24/2017   Echo Aug 2018   Mesenteric ischemia ([place]   a. 12/2019 s/p PTA/stenting of SMA w/ 6x29 balloon expandable VBX stent.   Moderate mitral regurgitation    Neuropathy    NSTEMI (non-ST elevated myocardial infarction) ([facility]/21/2023   Opiate use 11/16/2015   Peripheral arterial disease ([facility]   a. 05/2018 s/p PTA/DCBA  of L CFA; b. 11/2018 PTAR TP trunk and prox peroneal, DCBA R SFA & prox pop, Viabahn stenting to mid/distal R SFA; c. 05/2019 DCBA/stenting L SFA.   Pes planus of both feet 11/16/2015   Plantar fasciitis of right foot 11/16/2015   Pneumonia    Severe aortic stenosis    a. 05/2018 Echo: EF 60-65%, GrI DD, mild AS (mean grad 61mHg, AVA 1.31cm^2); b. 07/2021 Echo: Ef 30-35%, mild LVH, GrII DD, sev mid-apical/anteroseptal/ant/apical HK. Nl RV fxn. Mildly dil LA. Mod MR. Sev AS (mean grad 34.279mg, AVA 0.64 cm^2).   Wheezing      Allergies  Allergen Reactions   Meloxicam Other (See Comments)    GI bleeding   Nsaids Other (See Comments)    Gi bleeding   Sitagliptin Hives and Itching    Januvia    Review of Systems  All other systems reviewed and are negative.  Physical Exam Vitals reviewed.  Constitutional:      General: She is not in acute distress.    Appearance: She is ill-appearing.  Cardiovascular:     Rate and Rhythm: Normal rate.  Pulmonary:     Effort: Pulmonary effort is normal.  Neurological:     Mental Status: She is alert and oriented to person, place, and time.     Motor: Weakness present.    Vital Signs: BP (!) 129/44    Pulse 84    Temp 98.1 F (36.7 C) (Oral)    Resp 17    Ht _0  (1.549 m)    Wt 61.5 kg    SpO2 95%    BMI 25.62 kg/m  Pain Scale: 0-10   Pain Score: 0-No  pain   SpO2: SpO2: 95 % O2 Device:SpO2: 95 % O2 Flow Rate: .O2 Flow Rate (L/min): 3 L/min  IO: Intake/output summary:  Intake/Output Summary (Last 24 hours) at 07/27/2021 1545 Last data filed at 07/27/2021 1500 Gross per 24 hour  Intake 1425.45 ml  Output 3195 ml  Net -1769.55 ml    LBM: Last BM Date: 07/04/2021 Baseline Weight: Weight: 61.7 kg Most recent weight: Weight: 61.5 kg      Palliative Assessment/Data: PPS 30%    MDM - High due to: 1 or more chronic illnesses with severe exacerbation, progression, or side effects of treatment OR acute or chronic illness or injury that poses a threat to life or bodily function Review of prior external notes, review of test results, assessment requiring an independent historian Discussion or decision not to resuscitate or to de-escalate care because poor prognosis  Signed by: JuLavena BullionNP   Please contact Palliative Medicine Team phone at [phone] questions and concerns.  For individual provider: See AmShea Evans

## 2021-07-28 LAB — BASIC METABOLIC PANEL
Anion gap: 10 (ref 5–15)
BUN: 29 mg/dL — ABNORMAL HIGH (ref 8–23)
CO2: 28 mmol/L (ref 22–32)
Calcium: 8.9 mg/dL (ref 8.9–10.3)
Chloride: 94 mmol/L — ABNORMAL LOW (ref 98–111)
Creatinine, Ser: 0.99 mg/dL (ref 0.44–1.00)
GFR, Estimated: 60 mL/min — ABNORMAL LOW (ref >60)
Glucose, Bld: 168 mg/dL — ABNORMAL HIGH (ref 70–99)
Potassium: 4.3 mmol/L (ref 3.5–5.1)
Sodium: 132 mmol/L — ABNORMAL LOW (ref 135–145)

## 2021-07-28 LAB — CBC
HCT: 27 % — ABNORMAL LOW (ref 36.0–46.0)
Hemoglobin: 9.1 g/dL — ABNORMAL LOW (ref 12.0–15.0)
MCH: 27.4 pg (ref 26.0–34.0)
MCHC: 33.7 g/dL (ref 30.0–36.0)
MCV: 81.3 fL (ref 80.0–100.0)
Platelets: 181 10*3/uL (ref 150–400)
RBC: 3.32 MIL/uL — ABNORMAL LOW (ref 3.87–5.11)
RDW: 14.7 % (ref 11.5–15.5)
WBC: 9.6 10*3/uL (ref 4.0–10.5)
nRBC: 0 % (ref 0.0–0.2)

## 2021-07-28 LAB — HEPARIN LEVEL (UNFRACTIONATED): Heparin Unfractionated: 0.23 IU/mL — ABNORMAL LOW (ref 0.30–0.70)

## 2021-07-28 LAB — ZINC: Zinc: 59 ug/dL (ref 44–115)

## 2021-07-28 LAB — VITAMIN B1: Vitamin B1 (Thiamine): 129.2 nmol/L (ref 66.5–200.0)

## 2021-07-28 LAB — COPPER, SERUM: Copper: 121 ug/dL (ref 80–158)

## 2021-07-28 LAB — GLUCOSE, CAPILLARY: Glucose-Capillary: 123 mg/dL — ABNORMAL HIGH (ref 70–99)

## 2021-07-28 MED ORDER — POTASSIUM CHLORIDE 20 MEQ PO PACK
40.0000 meq | PACK | Freq: Once | ORAL | Status: AC
  Administered 2021-07-28: 40 meq
  Filled 2021-07-28: qty 2

## 2021-07-28 MED ORDER — MONTELUKAST SODIUM 10 MG PO TABS
10.0000 mg | ORAL_TABLET | Freq: Every day | ORAL | Status: DC
  Administered 2021-07-28 – 2021-07-29 (×2): 10 mg via ORAL
  Filled 2021-07-28 (×2): qty 1

## 2021-07-28 MED ORDER — POLYETHYLENE GLYCOL 3350 17 G PO PACK: 17.0000 g | PACK | Freq: Every day | ORAL | Status: DC

## 2021-07-28 MED ORDER — THIAMINE HCL 100 MG PO TABS
100.0000 mg | ORAL_TABLET | Freq: Every day | ORAL | Status: DC
  Administered 2021-07-28 – 2021-07-30 (×3): 100 mg via ORAL
  Filled 2021-07-28 (×3): qty 1

## 2021-07-28 MED ORDER — FUROSEMIDE 10 MG/ML IJ SOLN
60.0000 mg | Freq: Two times a day (BID) | INTRAMUSCULAR | Status: AC
  Administered 2021-07-28 (×2): 60 mg via INTRAVENOUS
  Filled 2021-07-28 (×2): qty 6

## 2021-07-28 MED ORDER — ATORVASTATIN CALCIUM 40 MG PO TABS
40.0000 mg | ORAL_TABLET | Freq: Every day | ORAL | Status: DC
  Administered 2021-07-28 – 2021-07-30 (×3): 40 mg via ORAL
  Filled 2021-07-28 (×3): qty 1

## 2021-07-28 MED ORDER — MAGNESIUM SULFATE 2 GM/50ML IV SOLN
2.0000 g | Freq: Once | INTRAVENOUS | Status: AC
  Administered 2021-07-28: 2 g via INTRAVENOUS
  Filled 2021-07-28: qty 50

## 2021-07-28 MED ORDER — ACETAMINOPHEN 160 MG/5ML PO SOLN: 650.0000 mg | ORAL | Status: DC | PRN

## 2021-07-28 MED ORDER — ADULT MULTIVITAMIN W/MINERALS CH
1.0000 | ORAL_TABLET | Freq: Two times a day (BID) | ORAL | Status: DC
  Administered 2021-07-29 – 2021-07-30 (×2): 1 via ORAL
  Filled 2021-07-28 (×3): qty 1

## 2021-07-28 MED ORDER — ASPIRIN 81 MG PO CHEW
81.0000 mg | CHEWABLE_TABLET | Freq: Every day | ORAL | Status: DC
  Administered 2021-07-28 – 2021-07-30 (×3): 81 mg via ORAL
  Filled 2021-07-28 (×3): qty 1

## 2021-07-28 MED ORDER — DOCUSATE SODIUM 100 MG PO CAPS
100.0000 mg | ORAL_CAPSULE | Freq: Two times a day (BID) | ORAL | Status: DC
  Administered 2021-07-28: 100 mg via ORAL
  Filled 2021-07-28 (×2): qty 1

## 2021-07-28 NOTE — Progress Notes (Addendum)
Patient ID: Andrea Holden, female   DOB: 1945-09-12, 76 y.o.   MRN: [id number]     Advanced Heart Failure Rounding Note  PCP-Cardiologist: Ida Rogue, MD   Subjective:    1/25: IABP placed. Developed Code Stroke after cath. Head CT and CTA no evidence of CVA or LVO. Intubated for airway protection.  1/26: NE added back  1/27: Extubated, off NE.   She is alert, now on 2L [place].  BP stable off pressors and on IABP 1:1.  Site stable.   No chest pain or dyspnea.   Good diuresis with IV Lasix 80 mg bid yesterday, I/Os net negative 3374.   Objective:   Weight Range: 61.5 kg Body mass index is 25.62 kg/m.   Vital Signs:   Temp:  [97.2 F (36.2 C)-99 F (37.2 C)] 98.5 F (36.9 C) (01/28 0757) Pulse Rate:  [84-248] 216 (01/28 0800) Resp:  [12-20] 17 (01/28 0800) BP: (114-146)/(36-90) 120/57 (01/28 0800) SpO2:  [90 %-99 %] 91 % (01/28 0800) Last BM Date: 07/06/2021  Weight change: Filed Weights   07/06/2021 1600 07/26/21 0600 07/27/21 0620  Weight: 62.7 kg 60.2 kg 61.5 kg    Intake/Output:   Intake/Output Summary (Last 24 hours) at 07/28/2021 0826 Last data filed at 07/28/2021 0600 Gross per 24 hour  Intake 1622.67 ml  Output 4980 ml  Net -3357.33 ml      Physical Exam    General: NAD Neck: JVP 12 cm, no thyromegaly or thyroid nodule.  Lungs: Clear to auscultation bilaterally with normal respiratory effort. CV: Nondisplaced PMI.  Heart regular S1/S2, no S3/S4, 3/6 SEM RUSB.  No peripheral edema.   Abdomen: Soft, nontender, no hepatosplenomegaly, no distention.  Skin: Intact without lesions or rashes.  Neurologic: Alert and oriented x 3.  Psych: Normal affect. Extremities: No clubbing or cyanosis.  HEENT: Normal.    Telemetry   NSR 70s  Labs    CBC Recent Labs    07/27/21 0502 07/28/21 0130  WBC 10.9* 9.6  HGB 8.6* 9.1*  HCT 26.3* 27.0*  MCV 82.4 81.3  PLT 233 0000000   Basic Metabolic Panel Recent Labs    07/26/21 0437 07/27/21 0502  07/27/21 2311 07/28/21 0130  NA 132*   < > 130* 132*  K 4.0   < > 3.6 4.3  CL 98   < > 93* 94*  CO2 25   < > 26 28  GLUCOSE 113*   < > 157* 168*  BUN 36*   < > 30* 29*  CREATININE 0.98   < > 0.95 0.99  CALCIUM 8.8*   < > 8.8* 8.9  MG 2.1  --  1.7  --   PHOS 5.5*  --   --   --    < > = values in this interval not displayed.   Liver Function Tests Recent Labs    07/26/21 0437  AST 49*  ALT 34  ALKPHOS 80  BILITOT 0.6  PROT 5.5*  ALBUMIN 2.8*   No results for input(s): LIPASE, AMYLASE in the last 72 hours. Cardiac Enzymes No results for input(s): CKTOTAL, CKMB, CKMBINDEX, TROPONINI in the last 72 hours.  BNP: BNP (last 3 results) Recent Labs    07/21/21 2329  BNP 2,354.7*    ProBNP (last 3 results) No results for input(s): PROBNP in the last 8760 hours.   D-Dimer No results for input(s): DDIMER in the last 72 hours. Hemoglobin A1C No results for input(s): HGBA1C in the last 72 hours.  Fasting Lipid Panel Recent Labs    07/26/21 0437  TRIG 85   Thyroid Function Tests No results for input(s): TSH, T4TOTAL, T3FREE, THYROIDAB in the last 72 hours.  Invalid input(s): FREET3  Other results:   Imaging    No results found.   Medications:     Scheduled Medications:  aspirin  81 mg Per Tube Daily   atorvastatin  40 mg Per Tube Daily   Chlorhexidine Gluconate Cloth  6 each Topical Daily   docusate  100 mg Per Tube BID   feeding supplement  237 mL Oral BID BM   furosemide  60 mg Intravenous BID   Gerhardt's butt cream   Topical BID   insulin aspart  1-3 Units Subcutaneous Q4H   ipratropium-albuterol  3 mL Nebulization BID   mouth rinse  15 mL Mouth Rinse BID   montelukast  10 mg Per Tube QHS   multivitamin with minerals  1 tablet Per Tube BID   pantoprazole (PROTONIX) IV  40 mg Intravenous Daily   polyethylene glycol  17 g Per Tube Daily   sodium chloride flush  3 mL Intravenous Q12H   thiamine  100 mg Per Tube Daily    Infusions:  sodium  chloride     heparin 1,400 Units/hr (07/28/21 0600)   nitroGLYCERIN 5 mcg/min (07/01/2021 1216)    PRN Medications: acetaminophen (TYLENOL) oral liquid 160 mg/5 mL, lip balm, ondansetron (ZOFRAN) IV    Patient Profile   76 y/o female smoker w/ severe PAD, COPD, Type 2DM and HLD admitted w/ NSTEMI w/ critical LM disease on cath, new systolic heart failure w/ low output, severe aortic stenosis, mod-severe MR. Transferred from [facility] for further management/ optimization of HF and consultation for potential CABG + AVR vs PCI w/ TAVR.   Assessment/Plan   1. CAD - NSTEMI, HS trop Hs trop 3,727>>4,188>>4,188>>1,949 - LHC w/ critical, 95%, LM stenosis + 70% p-mLAD disease - IABP placed 1/25 for coronary perfusion, continues at 1:1 - Evaluated by CT surgery . Not CABG candidate. Too high risk given other comorbidities and poor nutritional status.  - After discussions with patient and family, favor palliative approach and would not want to pursue aggressive attempts with BAV -> high risk LM/LCx PCI -> TAVR given severe comorbidities and lack of access for Impella support  - No chest pain today.  - continue heparin gtt - ASA + high dose statin - Off ? blocker (metoprolol) w/ low output     2. Severe Aortic Stenosis  - mean gradient 34 mmHg and valve area of 0.44. EF 30-35% - Too high risk for SAVR  - As above, have decided against high risk TAVR and plan for eventual transition to comfort care.     3. Acute Systolic Heart Failure>> Low Output - Echo 2019, EF normal 60-65%, RV normal - EF now 30-35%, RV normal - ICM + valvular HD. Obstructive CAD w/ critical LM + LAD disease. Severe AS  - RHC w/ elevated filling pressures and low output, CI 1.97L/min/m2. - Now w/ IABP, 1:1. Has a friend and family coming in over the weekend and wants to leave IABP in until Monday so she can interact with them. Will plan to pull IABP and switch to comfort care on Monday am.  - She is now off NE.  - Off ?  blocker w/ low output - Holding Losartan w/ recent hypotension  - Good diuresis yesterday, still some volume overload on exam => will give Lasix 60  mg IV bid today.    4. CODE Stroke  - post IABP placement 1/25 - Head CT and CTA no evidence of CVA or LVO - EEG pending  - Suspect she may have had transient ischemia +/- hypercarbia due to high-flow O2 in cath lab. - Now following commands appropriately  - Appreciate Neurology's assistance   5. Hypomagnesemia - resolved w/ repletion   6. Hyponatremia - improving, 128>>132>>132   7. COPD, Acute on Chronic Hypoxic Respiratory Failure - noted in history, long time smoker  - no record of PFTs in chart  - Intubated during CODE Stroke for airway protection - Now extubated on 2L [place].    8. Tobacco Abuse - nicotine patch    9. HLD, LDL Goal < 70   - lipids at goal, LDL 49 mg/dL - continue atorvastatin 40    10. Type 2DM  - Hgb A1c 6.0  - SSI   11. DNR  IABP in over weekend to allow interaction with family, remove on Monday with transition to comfort care.   CRITICAL CARE Performed by: Loralie Champagne  Total critical care time: 40 minutes  Critical care time was exclusive of separately billable procedures and treating other patients.  Critical care was necessary to treat or prevent imminent or life-threatening deterioration.  Critical care was time spent personally by me (independent of midlevel providers or residents) on the following activities: development of treatment plan with patient and/or surrogate as well as nursing, discussions with consultants, evaluation of patient's response to treatment, examination of patient, obtaining history from patient or surrogate, ordering and performing treatments and interventions, ordering and review of laboratory studies, ordering and review of radiographic studies, pulse oximetry and re-evaluation of patient's condition.  Loralie Champagne, MD  8:26 AM

## 2021-07-28 NOTE — Progress Notes (Signed)
ANTICOAGULATION CONSULT NOTE  Pharmacy Consult for heparin Indication: IABP  Allergies  Allergen Reactions   Meloxicam Other (See Comments)    GI bleeding   Nsaids Other (See Comments)    Gi bleeding   Sitagliptin Hives and Itching    Januvia     Patient Measurements: Height: 5\' 1"  (154.9 cm) Weight: 61.5 kg (135 lb 9.3 oz) IBW/kg (Calculated) : 47.8 Heparin Dosing Weight: 60 kg  Vital Signs: Temp: 98.5 F (36.9 C) (01/28 0757) Temp Source: Oral (01/28 0757) BP: 120/57 (01/28 0800) Pulse Rate: 216 (01/28 0800)  Labs: Recent Labs     0000 08/05/21 1103 08/05/21 1523 05-Aug-2021 1641 07/26/21 0437 07/26/21 1400 07/27/21 0502 07/27/21 0754 07/27/21 1547 07/27/21 2311 07/28/21 0130  HGB  --   --   --    < > 8.7*  --  8.6*  --   --   --  9.1*  HCT  --   --   --    < > 26.4*  --  26.3*  --   --   --  27.0*  PLT  --   --   --   --  236  --  233  --   --   --  181  HEPARINUNFRC  --   --   --   --  <0.10*   < >  --  0.20* 0.10*  --  0.23*  CREATININE   < >  --  1.01*  --  0.98  --  0.98  --   --  0.95 0.99  TROPONINIHS  --  1,468* 1,949*  --   --   --   --   --   --   --   --    < > = values in this interval not displayed.     Estimated Creatinine Clearance: 41.9 mL/min (by C-G formula based on SCr of 0.99 mg/dL).   Medical History: Past Medical History:  Diagnosis Date   Acute on chronic diastolic CHF (congestive heart failure) (HCC) 07/29/2021   Allergy    seasonal allergies   Anemia 2014   needed 5 units of blood d/t passing out, weak   Anxiety    Arthritis    Asthma    allergy induced asthma   CAD (coronary artery disease)    a. 05/2018 MV: EF 82%, no ischemia/infarct; b. 07/2021 NSTEMI/Cath: LM 27m, LAD 70p/m, LCX min irregs, RCA 20ost/p. AoV mean grad of w/ AVA of 0.44. EF 25-35%.   Cardiac murmur 12/12/2017   Cataract    left   Complication of anesthesia    arrhythmia following colonoscopy   Degenerative disc disease, lumbar    Depression     Diabetes mellitus    Diabetic neuropathy (HCC) 11/16/2015   GERD (gastroesophageal reflux disease)    H/O transfusion    patient was given 5 units of blood while at [facility], blood type O+   HFrEF (heart failure with reduced ejection fraction) (HCC)    a. 05/2018 Echo: EF 60-65%; b. 07/2021 Echo: EF 30-35%.   History of chicken pox    History of hiatal hernia    History of measles, mumps, or rubella    HOH (hard of hearing)    does not use hearing aides yet   Hyperlipidemia    Hypertension    Irregular heartbeat    Ischemic cardiomyopathy    a. 05/2018 Echo: EF 60-65%; b. 07/2021 Echo: EF 30-35%. GrII DD, sev mid-apical/anteroseptal/ant/apical  HK.   Ischemic colitis ([place])    LVH (left ventricular hypertrophy) 12/24/2017   Echo Aug 2018   Mesenteric ischemia ([place])    a. 12/2019 s/p PTA/stenting of SMA w/ 6x29 balloon expandable VBX stent.   Moderate mitral regurgitation    Neuropathy    NSTEMI (non-ST elevated myocardial infarction) ([place]) 07/21/2021   Opiate use 11/16/2015   Peripheral arterial disease ([place])    a. 05/2018 s/p PTA/DCBA of L CFA; b. 11/2018 PTAR TP trunk and prox peroneal, DCBA R SFA & prox pop, Viabahn stenting to mid/distal R SFA; c. 05/2019 DCBA/stenting L SFA.   Pes planus of both feet 11/16/2015   Plantar fasciitis of right foot 11/16/2015   Pneumonia    Severe aortic stenosis    a. 05/2018 Echo: EF 60-65%, GrI DD, mild AS (mean grad 30mmHg, AVA 1.31cm^2); b. 07/2021 Echo: Ef 30-35%, mild LVH, GrII DD, sev mid-apical/anteroseptal/ant/apical HK. Nl RV fxn. Mildly dil LA. Mod MR. Sev AS (mean grad 34.26mmHg, AVA 0.64 cm^2).   Wheezing     Assessment: 76yo female was admitted to [facility] for NSTEMI, started on heparin and tx'd to [facility] after cath for further w/u by CVTS. IABP placed 1/25 in attempt to stabilize patient to allow CVTS evaluation. Heparin dosing per IABP protocol. Patient is not on anticoagulation PTA.  Heparin level up to 0.23, currently on 1400 units/hr.  CBC stable. No bleeding or issues with infusion per discussion with RN.  Goal of Therapy:  Heparin level 0.2-0.5 units/ml Monitor platelets by anticoagulation protocol: Yes   Plan:  Continue heparin at 1400 units/hr Monitor daily CBC, s/sx bleeding  Erin Hearing PharmD., BCPS Clinical Pharmacist 07/28/2021 8:31 AM

## 2021-07-28 NOTE — Progress Notes (Signed)
eLink Physician-Brief Progress Note Patient Name: RIANE RUNG DOB: June 11, 1946 MRN: [id number]   Date of Service  07/28/2021  HPI/Events of Note  Notified of K 3.6, Mg 1.7, creatinine 0.95  eICU Interventions  Ordered K 40 meqs PO and Mg 1 g IVPB Electrolyte replacement protocol to be initiated     Intervention Category Intermediate Interventions: Electrolyte abnormality - evaluation and management  Darl Pikes 07/28/2021, 12:26 AM

## 2021-07-28 NOTE — Progress Notes (Signed)
NAME:  Andrea Holden, MRN:  [id number], DOB:  11-12-1945, LOS: 4 ADMISSION DATE:  07/20/2021, CONSULTATION DATE:  07/05/2021 REFERRING MD:  Harl Bowie, CHIEF COMPLAINT:  Acute respirtatory failure   History of Present Illness:  Andrea Holden is a 76 y.o. female who is seen in consultation at the request of Dr. Harl Bowie for recommendations on further evaluation and management of acute respiratory failure.   Patient presented to the [facility] ED on 1/23 with a chief complaint of dyspnea, back pain, and NSTEMI, who was found to have LV dysfunction in the setting of sever aortic stenosis and critical left mitral disease  Pertinent past medical history of aortic stenosis, PAD s/p multiple LE PTAs/stents, mesenteric ischemia/ischemic colitis, s/p gastric bypass, HLD, DMII, COPD, active smoker.  On 1/25 went to the cath lab for placement of a IABP. During the procedure the patient became unresponsive, intubated. A code stroke was called.   PCCM was consulted for assistance  Pertinent  Medical History  Aortic stenosis, PAD s/p multiple LE PTAs/stents, mesenteric ischemia/ischemic colitis, s/p gastric bypass, HLD, DMII, COPD, active smoker  Significant Hospital Events: Including procedures, antibiotic start and stop dates in addition to other pertinent events   1/21 admitted to [facility] with NSTEMI 1/23 LHC  Interim History / Subjective:  Patient was successfully extubated yesterday, tolerating well so far Remains on IABP No overnight issues  Objective   Blood pressure (!) 120/57, pulse (!) 216, temperature 98.5 F (36.9 C), temperature source Oral, resp. rate 17, height 5\' 1"  (1.549 m), weight 61.5 kg, SpO2 91 %.        Intake/Output Summary (Last 24 hours) at 07/28/2021 0821 Last data filed at 07/28/2021 0600 Gross per 24 hour  Intake 1622.67 ml  Output 4980 ml  Net -3357.33 ml   Filed Weights   07/19/2021 1600 07/26/21 0600 07/27/21 0620  Weight: 62.7 kg 60.2 kg 61.5 kg     Examination: General: Critically ill appearing elderly female, lying in the bed HEENT: [place]/AT, eyes anicteric Neuro: Awake, following commands, moving all extremities,  CV: S1S2, RRR, IABP.  Strong systolic murmur heard all over PULM: diminished breath sounds bilaterally, no wheezing, no significant ETT secretions  GI: soft, NT Extremities: +clubbing, no cyanosis, mild peripheral edema. RLE IABP Skin:  warm, dry  Net [id number] with twice daily Lasix 80 mg  Resolved Hospital Problem list     Assessment & Plan:  Acute respiratory failure with hypoxia and hypercapnia, suspect she had hypercapnia leading to metabolic encephalopathy  Acute cardiogenic pulmonary edema R>L pleural effusions COPD Active smoker Hypercapnia has cleared Patient mental status has improved, she is awake and following commands Patient was successfully extubated yesterday, remains on nasal cannula oxygen Now off sedation Continue gentle diuretics per heart failure team Duonebs Q4h Continue titrate oxygen with O2 sat goal> [id number]  Acute metabolic encephalopathy related to hypercapnia No focal deficit Patient mental status has improved  Severe aortic stenosis Moderate MR  Acute HFrEF with cardiogenic shock requiring inotropes and IABP CAD- severe 3 vessel disease Acute NSTEMI IABP management per cardiology, currently on 1:1 Unfortunately not candidate for CABG or aortic valve replacement Continue heparin Off vasopressors Continue aspirin and statin Diuretic management per heart failure team Remains off beta-blocker due to low output  DMII, controlled Fingersticks are well controlled, at goal Continue SSI  GERD Con't PPI  PAD HLD HX mesenteric ischemia, concern that this could be related to recent severe weight loss Continue lipitor 40  Hyponatremia due to heart  failure Serum sodium is improving, currently at 132 Continue diuresis Monitor intake and output and  electrolytes  Hyperphosphatemia Closely monitor  Severe protein calorie malnutrition-- reportedly has lost about 100# this year. Nutritionist is following Continue dietary supplements  Unstageable decubitus ulcer at the sacrum, POA Wound care is following  Best Practice (right click and "Reselect all SmartList Selections" daily)   Diet/type: Regular diet DVT prophylaxis: IV heparin GI prophylaxis: PPI Lines: Arterial Line and yes and it is still needed Foley:  Yes, and it is still needed Code Status: DNR/DNI Last date of multidisciplinary goals of care discussion [ 1/27 ] patient and her daughter were updated at bedside, explained that after extubation she may struggle, in that case patient would not want to be reintubated and would like to be placed on comfort care.  Labs   CBC: Recent Labs  Lab 07/07/2021 1228 07/01/2021 0505 07/24/2021 1641 07/26/21 0437 07/27/21 0502 07/28/21 0130  WBC 16.3* 10.7*  --  12.3* 10.9* 9.6  HGB 10.8* 8.7* 12.9 8.7* 8.6* 9.1*  HCT 32.8* 26.2* 38.0 26.4* 26.3* 27.0*  MCV 82.0 81.1  --  81.5 82.4 81.3  PLT 285 187  --  236 233 0000000    Basic Metabolic Panel: Recent Labs  Lab 07/23/21 0430 07/09/2021 1228 07/04/2021 0505 07/21/2021 1523 07/07/2021 1641 07/26/21 0437 07/27/21 0502 07/27/21 2311 07/28/21 0130  NA 127*   < > 128* 127* 157* 132* 132* 130* 132*  K 3.9   < > 4.3 4.4 4.7 4.0 3.9 3.6 4.3  CL 94*   < > 95* 96*  --  98 99 93* 94*  CO2 25   < > 23 23  --  25 24 26 28   GLUCOSE 111*   < > 123* 207*  --  113* 114* 157* 168*  BUN 24*   < > 33* 35*  --  36* 34* 30* 29*  CREATININE 0.68   < > 0.91 1.01*  --  0.98 0.98 0.95 0.99  CALCIUM 8.6*   < > 8.7* 8.4*  --  8.8* 8.8* 8.8* 8.9  MG 1.5*  --  1.7  --   --  2.1  --  1.7  --   PHOS  --   --   --   --   --  5.5*  --   --   --    < > = values in this interval not displayed.   GFR: Estimated Creatinine Clearance: 41.9 mL/min (by C-G formula based on SCr of 0.99 mg/dL). Recent Labs  Lab  07/21/2021 1228 07/25/21 0505 07/26/21 0437 07/27/21 0502 07/28/21 0130  WBC 16.3* 10.7* 12.3* 10.9* 9.6  LATICACIDVEN 1.2  --   --   --   --     Total critical care time: 36 minutes  Performed by: [place] care time was exclusive of separately billable procedures and treating other patients.   Critical care was necessary to treat or prevent imminent or life-threatening deterioration.   Critical care was time spent personally by me on the following activities: development of treatment plan with patient and/or surrogate as well as nursing, discussions with consultants, evaluation of patient's response to treatment, examination of patient, obtaining history from patient or surrogate, ordering and performing treatments and interventions, ordering and review of laboratory studies, ordering and review of radiographic studies, pulse oximetry and re-evaluation of patient's condition.   Jacky Kindle MD [facility] See Amion for pager If no response  to pager, please call [phone] until 7pm After 7pm, Please call E-link [phone]

## 2021-07-28 NOTE — Progress Notes (Signed)
OT Cancellation Note  Patient Details Name: GIANNAH ZAVADIL MRN: [id number] DOB: [birth year]   Cancelled Treatment:    Reason Eval/Treat Not Completed:  (Pt will be comfort care on Monday, signing off.)  Evern Bio 07/28/2021, 8:22 AM Martie Round, OTR/L Acute Rehabilitation Services Pager: [phone] Office: [phone]

## 2021-07-29 LAB — BASIC METABOLIC PANEL
Anion gap: 9 (ref 5–15)
BUN: 27 mg/dL — ABNORMAL HIGH (ref 8–23)
CO2: 30 mmol/L (ref 22–32)
Calcium: 8.7 mg/dL — ABNORMAL LOW (ref 8.9–10.3)
Chloride: 90 mmol/L — ABNORMAL LOW (ref 98–111)
Creatinine, Ser: 0.97 mg/dL (ref 0.44–1.00)
GFR, Estimated: >60 mL/min (ref >60)
Glucose, Bld: 165 mg/dL — ABNORMAL HIGH (ref 70–99)
Potassium: 3.8 mmol/L (ref 3.5–5.1)
Sodium: 129 mmol/L — ABNORMAL LOW (ref 135–145)

## 2021-07-29 LAB — CBC
HCT: 28 % — ABNORMAL LOW (ref 36.0–46.0)
Hemoglobin: 9.2 g/dL — ABNORMAL LOW (ref 12.0–15.0)
MCH: 26.9 pg (ref 26.0–34.0)
MCHC: 32.9 g/dL (ref 30.0–36.0)
MCV: 81.9 fL (ref 80.0–100.0)
Platelets: 203 10*3/uL (ref 150–400)
RBC: 3.42 MIL/uL — ABNORMAL LOW (ref 3.87–5.11)
RDW: 14.7 % (ref 11.5–15.5)
WBC: 10.4 10*3/uL (ref 4.0–10.5)
nRBC: 0 % (ref 0.0–0.2)

## 2021-07-29 LAB — HEPARIN LEVEL (UNFRACTIONATED): Heparin Unfractionated: 0.28 IU/mL — ABNORMAL LOW (ref 0.30–0.70)

## 2021-07-29 MED ORDER — POTASSIUM CHLORIDE CRYS ER 20 MEQ PO TBCR
40.0000 meq | EXTENDED_RELEASE_TABLET | Freq: Once | ORAL | Status: AC
  Administered 2021-07-29: 40 meq via ORAL
  Filled 2021-07-29: qty 2

## 2021-07-29 NOTE — Progress Notes (Signed)
ANTICOAGULATION CONSULT NOTE  Pharmacy Consult for heparin Indication: IABP  Allergies  Allergen Reactions   Meloxicam Other (See Comments)    GI bleeding   Nsaids Other (See Comments)    Gi bleeding   Sitagliptin Hives and Itching    Januvia     Patient Measurements: Height: 5\' 1"  (154.9 cm) Weight: 60.3 kg (132 lb 15 oz) IBW/kg (Calculated) : 47.8 Heparin Dosing Weight: 60 kg  Vital Signs: BP: 139/40 (01/29 0700) Pulse Rate: 243 (01/29 0700)  Labs: Recent Labs    07/27/21 0502 07/27/21 0754 07/27/21 1547 07/27/21 2311 07/28/21 0130 07/29/21 0216  HGB 8.6*  --   --   --  9.1* 9.2*  HCT 26.3*  --   --   --  27.0* 28.0*  PLT 233  --   --   --  181 203  HEPARINUNFRC  --    < > 0.10*  --  0.23* 0.28*  CREATININE 0.98  --   --  0.95 0.99 0.97   < > = values in this interval not displayed.     Estimated Creatinine Clearance: 42.4 mL/min (by C-G formula based on SCr of 0.97 mg/dL).   Medical History: Past Medical History:  Diagnosis Date   Acute on chronic diastolic CHF (congestive heart failure) ([place]) 07/11/2021   Allergy    seasonal allergies   Anemia 2014   needed 5 units of blood d/t passing out, weak   Anxiety    Arthritis    Asthma    allergy induced asthma   CAD (coronary artery disease)    a. 05/2018 MV: EF 82%, no ischemia/infarct; b. 07/2021 NSTEMI/Cath: LM 36m, LAD 70p/m, LCX min irregs, RCA 20ost/p. AoV mean grad of 64mmHg w/ AVA of 0.44. EF 25-35%.   Cardiac murmur 12/12/2017   Cataract    left   Complication of anesthesia    arrhythmia following colonoscopy   Degenerative disc disease, lumbar    Depression    Diabetes mellitus    Diabetic neuropathy ([place]) 11/16/2015   GERD (gastroesophageal reflux disease)    H/O transfusion    patient was given 5 units of blood while at [facility], blood type O+   HFrEF (heart failure with reduced ejection fraction) ([place])    a. 05/2018 Echo: EF 60-65%; b. 07/2021 Echo: EF 30-35%.   History of chicken pox     History of hiatal hernia    History of measles, mumps, or rubella    HOH (hard of hearing)    does not use hearing aides yet   Hyperlipidemia    Hypertension    Irregular heartbeat    Ischemic cardiomyopathy    a. 05/2018 Echo: EF 60-65%; b. 07/2021 Echo: EF 30-35%. GrII DD, sev mid-apical/anteroseptal/ant/apical HK.   Ischemic colitis ([place])    LVH (left ventricular hypertrophy) 12/24/2017   Echo Aug 2018   Mesenteric ischemia ([place])    a. 12/2019 s/p PTA/stenting of SMA w/ 6x29 balloon expandable VBX stent.   Moderate mitral regurgitation    Neuropathy    NSTEMI (non-ST elevated myocardial infarction) ([place]) 07/21/2021   Opiate use 11/16/2015   Peripheral arterial disease ([place])    a. 05/2018 s/p PTA/DCBA of L CFA; b. 11/2018 PTAR TP trunk and prox peroneal, DCBA R SFA & prox pop, Viabahn stenting to mid/distal R SFA; c. 05/2019 DCBA/stenting L SFA.   Pes planus of both feet 11/16/2015   Plantar fasciitis of right foot 11/16/2015   Pneumonia    Severe  aortic stenosis    a. 05/2018 Echo: EF 60-65%, GrI DD, mild AS (mean grad 22mmHg, AVA 1.31cm^2); b. 07/2021 Echo: Ef 30-35%, mild LVH, GrII DD, sev mid-apical/anteroseptal/ant/apical HK. Nl RV fxn. Mildly dil LA. Mod MR. Sev AS (mean grad 34.53mmHg, AVA 0.64 cm^2).   Wheezing     Assessment: 76yo female was admitted to [facility] for NSTEMI, started on heparin and tx'd to [facility] after cath for further w/u by CVTS. IABP placed 1/25 in attempt to stabilize patient to allow CVTS evaluation. Heparin dosing per IABP protocol. Patient is not on anticoagulation PTA.  Heparin level at goal 0.28, currently on 1400 units/hr. CBC stable. No bleeding or issues with infusion noted.  Goal of Therapy:  Heparin level 0.2-0.5 units/ml Monitor platelets by anticoagulation protocol: Yes   Plan:  Continue heparin at 1400 units/hr Monitor daily CBC, s/sx bleeding  Erin Hearing PharmD., BCPS Clinical Pharmacist 07/29/2021 7:38 AM

## 2021-07-29 NOTE — Progress Notes (Signed)
NAME:  Andrea Holden, MRN:  [id number], DOB:  03/15/1946, LOS: 5 ADMISSION DATE:  07/22/2021, CONSULTATION DATE:  07/14/2021 REFERRING MD:  Harl Bowie, CHIEF COMPLAINT:  Acute respirtatory failure   History of Present Illness:  Andrea Holden is a 76 y.o. female who is seen in consultation at the request of Dr. Harl Bowie for recommendations on further evaluation and management of acute respiratory failure.   Patient presented to the [facility] ED on 1/23 with a chief complaint of dyspnea, back pain, and NSTEMI, who was found to have LV dysfunction in the setting of sever aortic stenosis and critical left mitral disease  Pertinent past medical history of aortic stenosis, PAD s/p multiple LE PTAs/stents, mesenteric ischemia/ischemic colitis, s/p gastric bypass, HLD, DMII, COPD, active smoker.  On 1/25 went to the cath lab for placement of a IABP. During the procedure the patient became unresponsive, intubated. A code stroke was called.   PCCM was consulted for assistance  Pertinent  Medical History  Aortic stenosis, PAD s/p multiple LE PTAs/stents, mesenteric ischemia/ischemic colitis, s/p gastric bypass, HLD, DMII, COPD, active smoker  Significant Hospital Events: Including procedures, antibiotic start and stop dates in addition to other pertinent events   1/21 admitted to [facility] with NSTEMI 1/23 LHC  Interim History / Subjective:  Oxygen was titrated off, now on room air  Remains on IABP No overnight issues  Objective   Blood pressure (!) 131/44, pulse (!) 241, temperature 98.5 F (36.9 C), temperature source Oral, resp. rate 16, height 5\' 1"  (1.549 m), weight 60.3 kg, SpO2 94 %.        Intake/Output Summary (Last 24 hours) at 07/29/2021 1147 Last data filed at 07/29/2021 0900 Gross per 24 hour  Intake 878.37 ml  Output 3970 ml  Net -3091.63 ml   Filed Weights   07/26/21 0600 07/27/21 0620 07/29/21 0600  Weight: 60.2 kg 61.5 kg 60.3 kg    Examination: General: Acute on  chronically ill appearing elderly female, lying in the bed HEENT: [place]/AT, eyes anicteric Neuro: Awake, following commands, moving all extremities,  CV: S1S2, RRR, IABP.  Strong systolic murmur heard all over PULM: diminished breath sounds bilaterally, no wheezing, no significant ETT secretions  GI: soft, NT Extremities: +clubbing, no cyanosis, mild peripheral edema. RLE IABP Skin:  warm, dry  Net -2835 with twice daily Lasix 80 mg  Resolved Hospital Problem list     Assessment & Plan:  Acute respiratory failure with hypoxia and hypercapnia, suspect she had hypercapnia leading to metabolic encephalopathy  Acute cardiogenic pulmonary edema R>L pleural effusions COPD Active smoker Respiratory failure has resolved, hypercapnia has cleared and patient is on room air now Continue diuretics per heart failure team Duonebs [id number]  Acute metabolic encephalopathy related to hypercapnia, resolved No focal deficit  Severe aortic stenosis Moderate MR  Acute HFrEF with cardiogenic shock requiring inotropes and IABP CAD- severe 3 vessel disease Acute NSTEMI IABP management per cardiology, currently on 1:1 Unfortunately not candidate for CABG or aortic valve replacement Continue heparin Continue aspirin and statin Continue Lasix 60 mg twice daily Remains off beta-blocker due to low output  DMII, controlled Fingersticks are well controlled, at goal Continue SSI  GERD Con't PPI  PAD HLD HX mesenteric ischemia, concern that this could be related to recent severe weight loss Continue lipitor 40  Hyponatremia due to heart failure Serum sodium is improving, currently at 132 Continue diuresis Monitor intake and output and electrolytes  Hyperphosphatemia Closely monitor  Severe protein calorie malnutrition-- reportedly  has lost about 100# this year. Nutritionist is following Continue dietary supplements  Unstageable decubitus ulcer at the sacrum, POA Wound care is following  Best  Practice (right click and "Reselect all SmartList Selections" daily)   Diet/type: Regular diet DVT prophylaxis: IV heparin GI prophylaxis: PPI Lines: Arterial Line and yes and it is still needed Foley:  Yes, and it is still needed Code Status: DNR/DNI Last date of multidisciplinary goals of care discussion [ 1/27 ] patient and her daughter were updated at bedside, explained that after extubation she may struggle, in that case patient would not want to be reintubated and would like to be placed on comfort care.  Labs   CBC: Recent Labs  Lab 07/01/2021 0505 07/23/2021 1641 07/26/21 0437 07/27/21 0502 07/28/21 0130 07/29/21 0216  WBC 10.7*  --  12.3* 10.9* 9.6 10.4  HGB 8.7* 12.9 8.7* 8.6* 9.1* 9.2*  HCT 26.2* 38.0 26.4* 26.3* 27.0* 28.0*  MCV 81.1  --  81.5 82.4 81.3 81.9  PLT 187  --  236 233 181 [id number]    Basic Metabolic Panel: Recent Labs  Lab 07/23/21 0430 07/14/2021 1228 07/21/2021 0505 07/31/2021 1523 07/26/21 0437 07/27/21 0502 07/27/21 2311 07/28/21 0130 07/29/21 0216  NA 127*   < > 128*   < > 132* 132* 130* 132* 129*  K 3.9   < > 4.3   < > 4.0 3.9 3.6 4.3 3.8  CL 94*   < > 95*   < > 98 99 93* 94* 90*  CO2 25   < > 23   < > 25 24 26 28 30   GLUCOSE 111*   < > 123*   < > 113* 114* 157* 168* 165*  BUN 24*   < > 33*   < > 36* 34* 30* 29* 27*  CREATININE 0.68   < > 0.91   < > 0.98 0.98 0.95 0.99 0.97  CALCIUM 8.6*   < > 8.7*   < > 8.8* 8.8* 8.8* 8.9 8.7*  MG 1.5*  --  1.7  --  2.1  --  1.7  --   --   PHOS  --   --   --   --  5.5*  --   --   --   --    < > = values in this interval not displayed.   GFR: Estimated Creatinine Clearance: 42.4 mL/min (by C-G formula based on SCr of 0.97 mg/dL). Recent Labs  Lab 07/17/2021 1228 07/22/2021 0505 07/26/21 0437 07/27/21 0502 07/28/21 0130 07/29/21 0216  WBC 16.3*   < > 12.3* 10.9* 9.6 10.4  LATICACIDVEN 1.2  --   --   --   --   --    < > = values in this interval not displayed.    Total critical care time: 33  minutes  Performed by: [place] care time was exclusive of separately billable procedures and treating other patients.   Critical care was necessary to treat or prevent imminent or life-threatening deterioration.   Critical care was time spent personally by me on the following activities: development of treatment plan with patient and/or surrogate as well as nursing, discussions with consultants, evaluation of patient's response to treatment, examination of patient, obtaining history from patient or surrogate, ordering and performing treatments and interventions, ordering and review of laboratory studies, ordering and review of radiographic studies, pulse oximetry and re-evaluation of patient's condition.   [facility] See  Amion for pager If no response to pager, please call [phone] until 7pm After 7pm, Please call E-link [phone]

## 2021-07-29 NOTE — Progress Notes (Signed)
Patient ID: Andrea Holden, female   DOB: 1946/03/12, 76 y.o.   MRN: [id number]     Advanced Heart Failure Rounding Note  PCP-Cardiologist: Julien Nordmann, MD   Subjective:    1/25: IABP placed. Developed Code Stroke after cath. Head CT and CTA no evidence of CVA or LVO. Intubated for airway protection.  1/26: NE added back  1/27: Extubated, off NE.   She is alert, now on room air.  BP stable off pressors and on IABP 1:1.  Site stable.   No chest pain or dyspnea. No complaints.   Good diuresis with IV Lasix 60 mg bid yesterday, I/Os net negative 2835.    Objective:   Weight Range: 60.3 kg Body mass index is 25.12 kg/m.   Vital Signs:   Pulse Rate:  [160-268] 243 (01/29 0700) Resp:  [14-29] 16 (01/29 0700) BP: (113-151)/(36-101) 139/40 (01/29 0700) SpO2:  [80 %-97 %] 89 % (01/29 0700) Weight:  [60.3 kg] 60.3 kg (01/29 0600) Last BM Date: 07/07/2021  Weight change: Filed Weights   07/26/21 0600 07/27/21 0620 07/29/21 0600  Weight: 60.2 kg 61.5 kg 60.3 kg    Intake/Output:   Intake/Output Summary (Last 24 hours) at 07/29/2021 0802 Last data filed at 07/29/2021 0700 Gross per 24 hour  Intake 892.43 ml  Output 4000 ml  Net -3107.57 ml      Physical Exam    General: NAD Neck: No JVD, no thyromegaly or thyroid nodule.  Lungs: Clear to auscultation bilaterally with normal respiratory effort. CV: Nondisplaced PMI.  Heart regular S1/S2, no S3/S4, 2/6 SEM RUSB.  No peripheral edema.   Abdomen: Soft, nontender, no hepatosplenomegaly, no distention.  Skin: Intact without lesions or rashes.  Neurologic: Alert and oriented x 3.  Psych: Normal affect. Extremities: No clubbing or cyanosis.  HEENT: Normal.    Telemetry   NSR 70s  Labs    CBC Recent Labs    07/28/21 0130 07/29/21 0216  WBC 9.6 10.4  HGB 9.1* 9.2*  HCT 27.0* 28.0*  MCV 81.3 81.9  PLT 181 203   Basic Metabolic Panel Recent Labs    20/23/34 2311 07/28/21 0130 07/29/21 0216  NA 130* 132*  129*  K 3.6 4.3 3.8  CL 93* 94* 90*  CO2 26 28 30   GLUCOSE 157* 168* 165*  BUN 30* 29* 27*  CREATININE 0.95 0.99 0.97  CALCIUM 8.8* 8.9 8.7*  MG 1.7  --   --    Liver Function Tests No results for input(s): AST, ALT, ALKPHOS, BILITOT, PROT, ALBUMIN in the last 72 hours.  No results for input(s): LIPASE, AMYLASE in the last 72 hours. Cardiac Enzymes No results for input(s): CKTOTAL, CKMB, CKMBINDEX, TROPONINI in the last 72 hours.  BNP: BNP (last 3 results) Recent Labs    07/21/21 2329  BNP 2,354.7*    ProBNP (last 3 results) No results for input(s): PROBNP in the last 8760 hours.   D-Dimer No results for input(s): DDIMER in the last 72 hours. Hemoglobin A1C No results for input(s): HGBA1C in the last 72 hours. Fasting Lipid Panel No results for input(s): CHOL, HDL, LDLCALC, TRIG, CHOLHDL, LDLDIRECT in the last 72 hours.  Thyroid Function Tests No results for input(s): TSH, T4TOTAL, T3FREE, THYROIDAB in the last 72 hours.  Invalid input(s): FREET3  Other results:   Imaging    No results found.   Medications:     Scheduled Medications:  aspirin  81 mg Oral Daily   atorvastatin  40 mg Oral  Daily   Chlorhexidine Gluconate Cloth  6 each Topical Daily   docusate sodium  100 mg Oral BID   feeding supplement  237 mL Oral BID BM   Gerhardt's butt cream   Topical BID   insulin aspart  1-3 Units Subcutaneous Q4H   ipratropium-albuterol  3 mL Nebulization BID   mouth rinse  15 mL Mouth Rinse BID   montelukast  10 mg Oral QHS   multivitamin with minerals  1 tablet Oral BID   pantoprazole (PROTONIX) IV  40 mg Intravenous Daily   polyethylene glycol  17 g Oral Daily   potassium chloride  40 mEq Oral Once   sodium chloride flush  3 mL Intravenous Q12H   thiamine  100 mg Oral Daily    Infusions:  sodium chloride     heparin 1,400 Units/hr (07/29/21 0700)    PRN Medications: acetaminophen (TYLENOL) oral liquid 160 mg/5 mL, lip balm, ondansetron (ZOFRAN)  IV    Patient Profile   76 y/o female smoker w/ severe PAD, COPD, Type 2DM and HLD admitted w/ NSTEMI w/ critical LM disease on cath, new systolic heart failure w/ low output, severe aortic stenosis, mod-severe MR. Transferred from [facility] for further management/ optimization of HF and consultation for potential CABG + AVR vs PCI w/ TAVR.   Assessment/Plan   1. CAD - NSTEMI, HS trop Hs trop 3,727>>4,188>>4,188>>1,949 - LHC w/ critical, 95%, LM stenosis + 70% p-mLAD disease - IABP placed 1/25 for coronary perfusion, continues at 1:1 - Evaluated by CT surgery . Not CABG candidate. Too high risk given other comorbidities and poor nutritional status.  - After discussions with patient and family, favor palliative approach and would not want to pursue aggressive attempts with BAV -> high risk LM/LCx PCI -> TAVR given severe comorbidities and lack of access for Impella support  - No chest pain today.  - continue heparin gtt - ASA + high dose statin - Off ? blocker (metoprolol) w/ low output     2. Severe Aortic Stenosis  - mean gradient 34 mmHg and valve area of 0.44. EF 30-35% - Too high risk for SAVR  - As above, have decided against high risk TAVR and plan for eventual transition to comfort care.     3. Acute Systolic Heart Failure>> Low Output - Echo 2019, EF normal 60-65%, RV normal - EF now 30-35%, RV normal - ICM + valvular HD. Obstructive CAD w/ critical LM + LAD disease. Severe AS  - RHC w/ elevated filling pressures and low output, CI 1.97L/min/m2. - Now w/ IABP, 1:1. Has a friend and family coming in over the weekend and wants to leave IABP in until Monday so she can interact with them. Will plan to pull IABP and switch to comfort care on Monday am.  - She is now off NE.  - Off ? blocker w/ low output - Holding Losartan w/ recent hypotension  - Good diuresis yesterday, volume status looks good by exam today.  Think we can hold off on Lasix today.    4. CODE Stroke  - post IABP  placement 1/25 - Head CT and CTA no evidence of CVA or LVO - EEG pending  - Suspect she may have had transient ischemia +/- hypercarbia due to high-flow O2 in cath lab. - Now following commands appropriately  - Appreciate Neurology's assistance   5. Hypomagnesemia - resolved w/ repletion   6. Hyponatremia - 129 today   7. COPD, Acute on Chronic Hypoxic  Respiratory Failure - noted in history, long time smoker  - no record of PFTs in chart  - Intubated during CODE Stroke for airway protection - Now on room air with saturations hovering around 90%.  Not volume overloaded, suspect this is her baseline with COPD.    8. Tobacco Abuse - nicotine patch    9. HLD, LDL Goal < 70   - lipids at goal, LDL 49 mg/dL - continue atorvastatin 40    10. Type 2DM  - Hgb A1c 6.0  - SSI   11. DNR  IABP in over weekend to allow interaction with family, remove on Monday with transition to comfort care.  We discussed this plan again today and she is in agreement.   CRITICAL CARE Performed by: Marca Ancona  Total critical care time: 35 minutes  Critical care time was exclusive of separately billable procedures and treating other patients.  Critical care was necessary to treat or prevent imminent or life-threatening deterioration.  Critical care was time spent personally by me (independent of midlevel providers or residents) on the following activities: development of treatment plan with patient and/or surrogate as well as nursing, discussions with consultants, evaluation of patient's response to treatment, examination of patient, obtaining history from patient or surrogate, ordering and performing treatments and interventions, ordering and review of laboratory studies, ordering and review of radiographic studies, pulse oximetry and re-evaluation of patient's condition.  Marca Ancona, MD  8:02 AM

## 2021-07-30 LAB — BASIC METABOLIC PANEL
Anion gap: 8 (ref 5–15)
BUN: 22 mg/dL (ref 8–23)
CO2: 30 mmol/L (ref 22–32)
Calcium: 8.9 mg/dL (ref 8.9–10.3)
Chloride: 96 mmol/L — ABNORMAL LOW (ref 98–111)
Creatinine, Ser: 0.89 mg/dL (ref 0.44–1.00)
GFR, Estimated: >60 mL/min (ref >60)
Glucose, Bld: 124 mg/dL — ABNORMAL HIGH (ref 70–99)
Potassium: 4.3 mmol/L (ref 3.5–5.1)
Sodium: 134 mmol/L — ABNORMAL LOW (ref 135–145)

## 2021-07-30 LAB — HEPARIN LEVEL (UNFRACTIONATED): Heparin Unfractionated: 0.37 IU/mL (ref 0.30–0.70)

## 2021-07-30 LAB — MAGNESIUM: Magnesium: 1.8 mg/dL (ref 1.7–2.4)

## 2021-07-30 LAB — VITAMIN B6

## 2021-07-30 MED ORDER — MAGNESIUM SULFATE 2 GM/50ML IV SOLN
2.0000 g | Freq: Once | INTRAVENOUS | Status: AC
  Administered 2021-07-30: 2 g via INTRAVENOUS
  Filled 2021-07-30: qty 50

## 2021-07-30 MED ORDER — FENTANYL CITRATE PF 50 MCG/ML IJ SOSY: 200.0000 ug | PREFILLED_SYRINGE | INTRAMUSCULAR | Status: DC | PRN

## 2021-07-30 MED ORDER — POLYVINYL ALCOHOL 1.4 % OP SOLN
1.0000 [drp] | Freq: Four times a day (QID) | OPHTHALMIC | Status: DC | PRN
  Filled 2021-07-30: qty 15

## 2021-07-30 MED ORDER — DEXMEDETOMIDINE HCL IN NACL 400 MCG/100ML IV SOLN
0.4000 ug/kg/h | INTRAVENOUS | Status: DC
  Administered 2021-07-30: 1 ug/kg/h via INTRAVENOUS
  Filled 2021-07-30: qty 100

## 2021-07-30 MED ORDER — MIDAZOLAM HCL 2 MG/2ML IJ SOLN
INTRAMUSCULAR | Status: AC
  Administered 2021-07-30: 2 mg
  Filled 2021-07-30: qty 4

## 2021-07-30 MED ORDER — FENTANYL 2500MCG IN NS 250ML (10MCG/ML) PREMIX INFUSION
0.0000 ug/h | INTRAVENOUS | Status: DC
  Administered 2021-07-30: 400 ug/h via INTRAVENOUS
  Administered 2021-07-30: 200 ug/h via INTRAVENOUS
  Filled 2021-07-30 (×2): qty 250

## 2021-07-30 MED ORDER — GLYCOPYRROLATE 0.2 MG/ML IJ SOLN: 0.2000 mg | INTRAMUSCULAR | Status: DC | PRN

## 2021-07-30 MED ORDER — FENTANYL CITRATE PF 50 MCG/ML IJ SOSY
50.0000 ug | PREFILLED_SYRINGE | Freq: Once | INTRAMUSCULAR | Status: AC
  Administered 2021-07-30: 50 ug via INTRAVENOUS

## 2021-07-30 MED ORDER — FENTANYL CITRATE PF 50 MCG/ML IJ SOSY
PREFILLED_SYRINGE | INTRAMUSCULAR | Status: AC
  Administered 2021-07-30: 100 ug
  Filled 2021-07-30: qty 2

## 2021-07-30 MED ORDER — MIDAZOLAM BOLUS VIA INFUSION
2.0000 mg | INTRAVENOUS | Status: DC | PRN
  Filled 2021-07-30: qty 2

## 2021-07-30 MED ORDER — MIDAZOLAM-SODIUM CHLORIDE 100-0.9 MG/100ML-% IV SOLN
0.0000 mg/h | INTRAVENOUS | Status: DC
  Administered 2021-07-30: 2 mg/h via INTRAVENOUS
  Filled 2021-07-30: qty 100

## 2021-07-30 MED ORDER — TORSEMIDE 20 MG PO TABS
20.0000 mg | ORAL_TABLET | Freq: Every day | ORAL | Status: DC
  Administered 2021-07-30: 20 mg via ORAL
  Filled 2021-07-30: qty 1

## 2021-07-30 MED ORDER — BIOTENE DRY MOUTH MT LIQD: 15.0000 mL | OROMUCOSAL | Status: DC | PRN

## 2021-07-30 MED ORDER — FENTANYL CITRATE PF 50 MCG/ML IJ SOSY
PREFILLED_SYRINGE | INTRAMUSCULAR | Status: AC
  Filled 2021-07-30: qty 1

## 2021-08-01 LAB — VITAMIN A: Vitamin A (Retinoic Acid): 22.1 ug/dL (ref 22.0–69.5)

## 2021-08-01 NOTE — Progress Notes (Signed)
[facility] ADULT ICU REPLACEMENT PROTOCOL   The patient does apply for the [facility] Adult ICU Electrolyte Replacment Protocol based on the criteria listed below:   1.Exclusion criteria: TCTS patients, ECMO patients, and Dialysis patients 2. Is GFR >/= 30 ml/min? Yes.    Patient's GFR today is >60 3. Is SCr </= 2? Yes.   Patient's SCr is 0.89 mg/dL 4. Did SCr increase >/= 0.5 in 24 hours? No. 5.Pt's weight >40kg  Yes.   6. Abnormal electrolyte(s): Mag 1.8  7. Electrolytes replaced per protocol 8.  Call MD STAT for K+ </= 2.5, Phos </= 1, or Mag </= 1 Physician:  Jorene Guest 07/07/2021 4:24 AM

## 2021-08-01 NOTE — Progress Notes (Signed)
Patient ID: Andrea Holden, female   DOB: 1946-06-22, 76 y.o.   MRN: [id number]     Advanced Heart Failure Rounding Note  PCP-Cardiologist: Julien Nordmann, MD   Subjective:    1/25: IABP placed. Developed Code Stroke after cath. Head CT and CTA no evidence of CVA or LVO. Intubated for airway protection.  1/26: NE added back  1/27: Extubated, off NE.   Diuresed swell over weekend. Remains stable on IABP 1:1. Off pressors. No CP, SOB, orthopnea or PND.   Objective:   Weight Range: 60.3 kg Body mass index is 25.12 kg/m.   Vital Signs:   Pulse Rate:  [80-250] 215 (01/30 0756) Resp:  [10-22] 18 (01/30 0800) BP: (115-156)/(43-88) 136/52 (01/30 0800) SpO2:  [80 %-100 %] 100 % (01/30 0800) Last BM Date: 2021-08-08  Weight change: Filed Weights   07/26/21 0600 07/27/21 0620 07/29/21 0600  Weight: 60.2 kg 61.5 kg 60.3 kg    Intake/Output:   Intake/Output Summary (Last 24 hours) at 07/29/2021 0829 Last data filed at 07/29/2021 0800 Gross per 24 hour  Intake 746.28 ml  Output 1525 ml  Net -778.72 ml       Physical Exam    General:  Lying in bed No resp difficulty HEENT: normal Neck: supple. no JVD. Carotids 2+ bilat; no bruits. No lymphadenopathy or thryomegaly appreciated. Cor: PMI nondisplaced. Regular rate & rhythm. No rubs, gallops or murmurs. Lungs: clear Abdomen: soft, nontender, nondistended. No hepatosplenomegaly. No bruits or masses. Good bowel sounds.  Extremities: no cyanosis, clubbing, rash, edema RFA IABP Neuro: alert & orientedx3, cranial nerves grossly intact. moves all 4 extremities w/o difficulty. Affect pleasant   Telemetry   NSR 70-80s  Labs    CBC Recent Labs    07/28/21 0130 07/29/21 0216  WBC 9.6 10.4  HGB 9.1* 9.2*  HCT 27.0* 28.0*  MCV 81.3 81.9  PLT 181 203    Basic Metabolic Panel Recent Labs    56/25/63 2311 07/28/21 0130 07/29/21 0216 07/10/2021 0310  NA 130*   < > 129* 134*  K 3.6   < > 3.8 4.3  CL 93*   < > 90* 96*   CO2 26   < > 30 30  GLUCOSE 157*   < > 165* 124*  BUN 30*   < > 27* 22  CREATININE 0.95   < > 0.97 0.89  CALCIUM 8.8*   < > 8.7* 8.9  MG 1.7  --   --  1.8   < > = values in this interval not displayed.    Liver Function Tests No results for input(s): AST, ALT, ALKPHOS, BILITOT, PROT, ALBUMIN in the last 72 hours.  No results for input(s): LIPASE, AMYLASE in the last 72 hours. Cardiac Enzymes No results for input(s): CKTOTAL, CKMB, CKMBINDEX, TROPONINI in the last 72 hours.  BNP: BNP (last 3 results) Recent Labs    07/21/21 2329  BNP 2,354.7*     ProBNP (last 3 results) No results for input(s): PROBNP in the last 8760 hours.   D-Dimer No results for input(s): DDIMER in the last 72 hours. Hemoglobin A1C No results for input(s): HGBA1C in the last 72 hours. Fasting Lipid Panel No results for input(s): CHOL, HDL, LDLCALC, TRIG, CHOLHDL, LDLDIRECT in the last 72 hours.  Thyroid Function Tests No results for input(s): TSH, T4TOTAL, T3FREE, THYROIDAB in the last 72 hours.  Invalid input(s): FREET3  Other results:   Imaging    No results found.   Medications:  Scheduled Medications:  aspirin  81 mg Oral Daily   atorvastatin  40 mg Oral Daily   Chlorhexidine Gluconate Cloth  6 each Topical Daily   docusate sodium  100 mg Oral BID   feeding supplement  237 mL Oral BID BM   Gerhardt's butt cream   Topical BID   insulin aspart  1-3 Units Subcutaneous Q4H   ipratropium-albuterol  3 mL Nebulization BID   mouth rinse  15 mL Mouth Rinse BID   montelukast  10 mg Oral QHS   multivitamin with minerals  1 tablet Oral BID   pantoprazole (PROTONIX) IV  40 mg Intravenous Daily   polyethylene glycol  17 g Oral Daily   sodium chloride flush  3 mL Intravenous Q12H   thiamine  100 mg Oral Daily    Infusions:  sodium chloride     heparin 1,400 Units/hr (07/17/2021 0800)    PRN Medications: acetaminophen (TYLENOL) oral liquid 160 mg/5 mL, lip balm, ondansetron  (ZOFRAN) IV    Patient Profile   76 y/o female smoker w/ severe PAD, COPD, Type 2DM and HLD admitted w/ NSTEMI w/ critical LM disease on cath, new systolic heart failure w/ low output, severe aortic stenosis, mod-severe MR. Transferred from [facility] for further management/ optimization of HF and consultation for potential CABG + AVR vs PCI w/ TAVR.   Assessment/Plan   1. CAD - NSTEMI, HS trop Hs trop 3,727>>4,188>>4,188>>1,949 - LHC w/ critical, 95%, LM stenosis + 70% p-mLAD disease - IABP placed 1/25 for coronary perfusion, continues at 1:1 - Evaluated by CT surgery . Not CABG candidate. Too high risk given other comorbidities and poor nutritional status.  - After discussions with patient and family, favor palliative approach and would not want to pursue aggressive attempts with BAV -> high risk LM/LCx PCI -> TAVR given severe comorbidities and lack of access for Impella support  - No chest pain today.  - continue heparin gtt - ASA + high dose statin - Off ? blocker (metoprolol) w/ low output    - Plan to remove IABP today and switch to comfort care  2. Severe Aortic Stenosis  - mean gradient 34 mmHg and valve area of 0.44. EF 30-35% - Too high risk for SAVR  - As above, have decided against high risk TAVR and plan for eventual transition to comfort care.     3. Acute Systolic Heart Failure>> Low Output - Echo 2019, EF normal 60-65%, RV normal - EF now 30-35%, RV normal - ICM + valvular HD. Obstructive CAD w/ critical LM + LAD disease. Severe AS  - RHC w/ elevated filling pressures and low output, CI 1.97L/min/m2. - Now w/ IABP, 1:1. As above, plan to remove today - She is now off NE.  - Off ? blocker w/ low output - Holding Losartan w/ recent hypotension  - Volume status looks good  4. CODE Stroke  - post IABP placement 1/25 - Head CT and CTA no evidence of CVA or LVO - Suspect she may have had transient ischemia +/- hypercarbia due to high-flow O2 in cath lab. - Now following  commands appropriately  - Appreciate Neurology's assistance   5. Hypomagnesemia - resolved w/ repletion   6. Hyponatremia - 134 today   7. COPD, Acute on Chronic Hypoxic Respiratory Failure - noted in history, long time smoker  - no record of PFTs in chart  - Intubated during CODE Stroke for airway protection - Now on room air with saturations hovering around  90%.  Not volume overloaded, suspect this is her baseline with COPD.    8. Tobacco Abuse - nicotine patch    9. HLD, LDL Goal < 70   - lipids at goal, LDL 49 mg/dL - continue atorvastatin 40    10. Type 2DM  - Hgb A1c 6.0  - SSI   11. DNR   Will plan to remove IABP today. I think she will likely be stable from hemodynamic perspective, at least acutely, main issue will be if she develops recurrent refractory angina. If so, will need immediate comfort measures to avoid suffering.    CRITICAL CARE Performed by: Arvilla Meresaniel Marquett Bertoli  Total critical care time: 35 minutes  Critical care time was exclusive of separately billable procedures and treating other patients.  Critical care was necessary to treat or prevent imminent or life-threatening deterioration.  Critical care was time spent personally by me (independent of midlevel providers or residents) on the following activities: development of treatment plan with patient and/or surrogate as well as nursing, discussions with consultants, evaluation of patient's response to treatment, examination of patient, obtaining history from patient or surrogate, ordering and performing treatments and interventions, ordering and review of laboratory studies, ordering and review of radiographic studies, pulse oximetry and re-evaluation of patient's condition.  Arvilla Meresaniel Naziah Portee, MD  8:29 AM

## 2021-08-01 NOTE — TOC CM/SW Note (Signed)
.. °  Transition of Care (TOC) Screening Note   Patient Details  Name: Andrea Holden Date of Birth: [birth year]   Transition of Care [facility]) CM/SW Contact:    Elliot Cousin, RN Phone Number: 07/31/2021, 9:44 AM    Transition of Care Department [facility]) has reviewed patient and no TOC needs have been identified at this time. We will continue to monitor patient advancement through interdisciplinary progression rounds. If new patient transition needs arise, please place a TOC consult. Patient is currently comfort care. Palliative will speak to family, HF TOC CM/CSW will follow for dc needs. Isidoro Donning RN3 CCM, Heart Failure TOC CM [phone]

## 2021-08-01 NOTE — Death Summary Note (Addendum)
Advanced Heart Failure Death Summary  Death Summary   Patient ID: Andrea Holden MRN: [id number], DOB/AGE: [birth year] 76 y.o. Admit date: 07/03/2021 D/C date:     August 16, 2021   Primary Discharge Diagnoses:  NSTEMI/CAD Acute systolic CHF due to ischemic cardiomyopathy Severe aortic valve stenosis Acute on chronic hypoxic respiratory failure Tobacco abuse HLD Type II DM  Hospital Course:  76 y/o female w/ h/o tobacco abuse, extensive PAD s/p multiple lower arterial interventions, T2DM, HLD, COPD and AS (mild on echo 2019) admitted w/ NSTEMI. Echo w/ moderate to severe LV dysfunction, EF 30-35% (previously 60-65%), RV normal + severe aortic stenosis with mean gradient of 34 mmHg and valve area of 0.44.   R/LHC with critical left main stenosis, 95%, w/  significant calcifications. Prox-Mid LAD 70% stenosed.  No obstructive disease involving the right coronary artery. In addition to severe AS, also w/ moderate to severe mitral regurgitation.   RHC w/ elevated filling pressures and low output. RAP 13, mPCWP 22, CO 3.11, CI 1.97L/min/m2.   Transferred to [facility] for further management/ and consultation for potential CABG + AVR vs PCI w/ TAVR. On arrival to [facility] she had unstable angina and IABP was placed for coronary prefusion. Code Stroke called post procedure. She was intubated for airway protection. Head CT and CTA with no evidence of CVA or LVO. It was felt she likely had transient ischemia +/- hypercarbia d/t high-flow O2 in cath lab.   She was able to wean off vasopressor support. Diuresed with IV lasix for volume overload/low-output HF and extubated on 01/27.  She stabilized with IABP but remained end-stage. Not felt to be a candidate for CABG/SAVR. Options included comfort care vs BAV followed by high risk LM/Lcx PCI then TAVR. After extensive discussions with patient and family, they did not want to pursue aggressive treatment given comorbidities.   IABP was pulled on 08/16/2021 around 1:30  pm. Patient developed large groin bleed during removal of IABP and became very hypotensive and uncomfortable. Groin bleed controlled with manual pressure and given IVF. Patient sedated due to pain. Decision made to keep her fully comfortable per her wishes. Comfort gtts started. Patient expired with family at beside on 08/16/21 at 4:52 pm.    Significant Diagnostic Studies  IABP insertion, 07/29/2021 Abdominal aortogram showed patent ilio-femoral system bilaterally with moderate plaque.   Successful IABP placement.    Post-procedure the patient had decreased responsiveness and focal neuro deficits.    Code stroke was called. The patient was evaluated by the Stroke and CCM team. She was emergently intubated and take for stat head CT and CTA both of which were unremarkable. I updated family personally.   Echo, 07/23/21 IMPRESSIONS:  1. Left ventricular ejection fraction, by estimation, is 30 to 35%. The  left ventricle has moderately decreased function. The left ventricle  demonstrates regional wall motion abnormalities (see scoring  diagram/findings for description). There is mild left ventricular hypertrophy. Left ventricular diastolic parameters are consistent with Grade II diastolic dysfunction (pseudonormalization).  There is severe hypokinesis of the left ventricular, mid-apical anteroseptal wall, anterior wall, anterior segment and apical segment.   2. Right ventricular systolic function is normal. The right ventricular size is normal. There is mildly elevated pulmonary artery systolic pressure.  3. Left atrial size was mildly dilated.   4. The mitral valve is abnormal. Moderate mitral valve regurgitation. No evidence of mitral stenosis. Severe mitral annular calcification.   5. The aortic valve is calcified. Aortic valve regurgitation is not visualized. Severe  aortic valve stenosis. Aortic valve area, by VTI  measures 0.64 cm. Aortic valve mean gradient measures 34.2 mmHg.   R/LHC,  07/23/21   Mid LM lesion is 95% stenosed.   Prox LAD to Mid LAD lesion is 70% stenosed.   Ost RCA to Prox RCA lesion is 20% stenosed.   There is moderate to severe left ventricular systolic dysfunction.   LV end diastolic pressure is moderately elevated.   The left ventricular ejection fraction is 25-35% by visual estimate.   1.  Critical left main stenosis with significant calcifications.  No obstructive disease involving the right coronary artery. 2.  Moderately to severely reduced LV systolic function with an EF of 30 to 35% with global hypokinesis.  Moderate to severe mitral regurgitation. 3.  Severe aortic stenosis with mean gradient of 34 mmHg and valve area of 0.44. 4.  Right heart catheterization showed moderately elevated wedge pressure, moderate pulmonary hypertension and severely reduced cardiac output.  Prominent V waves on pulmonary wedge tracing suggestive of significant mitral regurgitation.  Consultations  Pulmonary/critical care Palliative care   Duration of Discharge Encounter: Greater than 35 minutes   Signed, FINCH, LINDSAY N, PA-C Aug 27, 2021, 5:24 PM  Agree with above.   Glori Bickers, MD  10:09 PM

## 2021-08-01 NOTE — Progress Notes (Addendum)
Pt refusing all CBG sticks, insulin injections, and blood draws at this time. This is consistent with report given from night shift RN. Pt alert and oriented, appropriate. Family at bedside and appropriate.  0830: Pt right PIV is not flushing, not drawing back blood. Dressing removed and catheter still occluded. PIV removed.   0850- Dr. Tacy Learn at bedside. No new orders at this time.  0938 - Dr. Haroldine Laws at bedside with this RN. IV Heparin stopped at this time per Dr. Haroldine Laws.   1000 - some oozing of blood noted at IABP insertion site, reinforced with gauze. Surrounding area is still soft to the touch and no bruising noted. Pharmacy and Dr. Tacy Learn made aware. No new orders at this time.  1100- Bed pad changed, more cream applied to sacrum per pt request. Sacral foams changed. Pt repositioned. No change in IABP site. Oozing has stopped at this time.    1215 - IABP removed by Dr. Haroldine Laws, pressure held by Dr. Haroldine Laws. Pt with large hematoma forming, pressure held by multiple staff at once. Family at bedside. Per Dr. Haroldine Laws, give fentanyl.   1245 - per Dr. Haroldine Laws, give more fentanyl and versed. Start drip.

## 2021-08-01 NOTE — Progress Notes (Signed)
Daily Progress Note   Patient Name: Andrea Holden       Date: 08-14-2021 DOB: 1945/08/10  Age: 76 y.o. MRN#: [id number] Attending Physician: Janina Mayo, MD Primary Care Physician: Marguerita Merles, MD Admit Date: 07/02/2021   HPI/Patient Profile: 76 y.o. female  with past medical history of aortic stenosis, PAD s/p multiple lower extremity PTAs/stents, mesenteric ischemia/ischemic colitis, s/p gastric bypass, HLD, DMII, COPD, and active smoker,  She presented to [facility] emergency department on 07/23/21 with complaint of dyspnea and back pain. She was admitted with NSTEMI, went for a R/LHC and was found to have LV dysfunction in the setting of severe aortic stenosis and critical left main stenosis. She was transferred to [facility] on 07/12/2021 for further management of HF and consultation for potential CABG + AVR versus PCI with TAVR.  On 1/25, she went to the cath lab for placement of IABP. During the procedure she became unresponsive and was intubated. Code stroke was called but head CT/CTA showed no evidence of CVA or LVO.   Subjective: Chart reviewed. IABP removed by Dr. Haroldine Laws earlier this afternoon.   I went to bedside to see patient. She is on fentanyl, versed, and precedex infusions. She appears comfortable. Unresponsive to voice. No non-verbal signs of pain or discomfort noted. Respirations are shallow, even, and unlabored. No excessive respiratory secretions noted.   Family present at bedside. Education and counseling provided on natural trajectory at EOL. Emotional support provided.     Objective:  Physical Exam Vitals reviewed.  Constitutional:      General: She is not in acute distress.    Appearance: She is ill-appearing.  Neurological:     Mental Status: She is unresponsive.             Vital Signs: BP (!) 66/45    Pulse 88    Temp 98.5 F (36.9 C) (Oral)    Resp (!) 4    Ht 5\' 1"  (1.549 m)    Wt 60.3 kg    SpO2 (!) 73%    BMI 25.12 kg/m  SpO2: SpO2: (!) 73 % O2 Device: O2 Device: Room Air O2 Flow Rate: O2 Flow Rate (L/min): 2 L/min       Palliative Assessment/Data: PPS 10%      Palliative Care Assessment & Plan  Assessment: - acute respiratory failure with hypoxia and hypercapnia - acute metabolic encephalopathy related to hypercapnia, resolved - acute cardiogenic pulmonary edema - severe aortic stenosis - Acute HFrEF with cardiogenic shock requiring inotropes and IABP - CAD with severe 3 vessel disease - acute NSTEMI  Recommendations/Plan: Continue comfort measures  Continue fentanyl, versed, and precedex infusions D/C CBG monitoring, oral medications, labs Added orders for symptom management at EOL  PMT will continue to follow and support  Symptom Management:  Glycopyrrolate (ROBINUL) for excessive secretions Ondansetron (ZOFRAN) prn for nausea Polyvinyl alcohol (LIQUIFILM TEARS) prn for dry eyes Antiseptic oral rinse (BIOTENE) prn for dry mouth  Code Status: DNR/DNI  Prognosis:  Hours - Days  Discharge Planning: Anticipated Hospital Death    Thank you for allowing the Palliative Medicine Team to assist in the care of this patient.  Total time: 38 minutes    Greater than 50%  of this time was spent counseling and coordinating care related to the above assessment and plan.  Lavena Bullion, NP  Please contact Palliative Medicine Team phone at [phone] for questions and concerns.

## 2021-08-01 NOTE — Progress Notes (Signed)
NAME:  Andrea Holden, MRN:  [id number], DOB:  October 12, 1945, LOS: 6 ADMISSION DATE:  07/20/2021, CONSULTATION DATE:  07/29/2021 REFERRING MD:  Harl Bowie, CHIEF COMPLAINT:  Acute respirtatory failure   History of Present Illness:  Andrea Holden is a 76 y.o. female who is seen in consultation at the request of Dr. Harl Bowie for recommendations on further evaluation and management of acute respiratory failure.   Patient presented to the [place] ED on 1/23 with a chief complaint of dyspnea, back pain, and NSTEMI, who was found to have LV dysfunction in the setting of sever aortic stenosis and critical left mitral disease  Pertinent past medical history of aortic stenosis, PAD s/p multiple LE PTAs/stents, mesenteric ischemia/ischemic colitis, s/p gastric bypass, HLD, DMII, COPD, active smoker.  On 1/25 went to the cath lab for placement of a IABP. During the procedure the patient became unresponsive, intubated. A code stroke was called.   PCCM was consulted for assistance  Pertinent  Medical History  Aortic stenosis, PAD s/p multiple LE PTAs/stents, mesenteric ischemia/ischemic colitis, s/p gastric bypass, HLD, DMII, COPD, active smoker  Significant Hospital Events: Including procedures, antibiotic start and stop dates in addition to other pertinent events   1/21 admitted to [place] with NSTEMI 1/23 LHC  Interim History / Subjective:  Diuresing well. Remains on IABP 1:1.  Denies chest pain, shortness of breath or palpitation  Objective   Blood pressure (!) 136/52, pulse (!) 215, temperature 98.5 F (36.9 C), temperature source Oral, resp. rate 18, height 5\' 1"  (1.549 m), weight 60.3 kg, SpO2 100 %.        Intake/Output Summary (Last 24 hours) at 25-Aug-2021 0856 Last data filed at 25-Aug-2021 0800 Gross per 24 hour  Intake 746.28 ml  Output 1525 ml  Net -778.72 ml   Filed Weights   07/26/21 0600 07/27/21 0620 07/29/21 0600  Weight: 60.2 kg 61.5 kg 60.3 kg     Examination: General: Acute on chronically ill appearing elderly female, lying in the bed HEENT: [place]/AT, eyes anicteric Neuro: Awake, following commands, moving all extremities,  CV: S1S2, RRR, IABP.  Strong systolic murmur heard all over PULM: diminished breath sounds bilaterally, no wheezing, no significant ETT secretions  GI: soft, NT Extremities: +clubbing, no cyanosis, mild peripheral edema. RLE IABP Skin:  warm, dry  Net -778 with twice daily Lasix 80 mg  Resolved Hospital Problem list     Assessment & Plan:  Acute respiratory failure with hypoxia and hypercapnia, suspect she had hypercapnia leading to metabolic encephalopathy  Acute cardiogenic pulmonary edema R>L pleural effusions COPD Active smoker Respiratory failure has resolved, hypercapnia has cleared and patient is on room air now Continue diuretics per heart failure team Duonebs [id number]  Acute metabolic encephalopathy related to hypercapnia, resolved No focal deficit  Severe aortic stenosis Moderate MR  Acute HFrEF with cardiogenic shock requiring inotropes and IABP CAD- severe 3 vessel disease Acute NSTEMI IABP management per cardiology, currently on 1:1 Unfortunately not candidate for CABG or aortic valve replacement Plan to remove IABP today, if patient has recurrence of chest pain or shortness of breath we will proceed with comfort care Heparin was stopped in anticipation of IABP removal Continue aspirin and statin Continue Lasix 60 mg twice daily Remains off beta-blocker due to low output  DMII, controlled Fingersticks are well controlled, at goal Continue SSI  GERD Con't PPI  PAD HLD HX mesenteric ischemia, concern that this could be related to recent severe weight loss Continue lipitor 40  Hyponatremia due  to heart failure Serum sodium is improving, currently at 134 Continue diuresis Monitor intake and output and electrolytes  Hyperphosphatemia Closely monitor  Severe protein calorie  malnutrition-- reportedly has lost about 100# this year. Nutritionist is following Continue dietary supplements  Unstageable decubitus ulcer at the sacrum, POA Wound care is following  Best Practice (right click and "Reselect all SmartList Selections" daily)   Diet/type: Regular diet DVT prophylaxis: IV heparin GI prophylaxis: PPI Lines: Arterial Line and yes and it is still needed Foley:  Yes, and it is still needed Code Status: DNR/DNI Last date of multidisciplinary goals of care discussion [ 1/27 ] patient and her daughter were updated at bedside, explained that after extubation she may struggle, in that case patient would not want to be reintubated and would like to be placed on comfort care.  Labs   CBC: Recent Labs  Lab 07/18/2021 0505 07/29/2021 1641 07/26/21 0437 07/27/21 0502 07/28/21 0130 07/29/21 0216  WBC 10.7*  --  12.3* 10.9* 9.6 10.4  HGB 8.7* 12.9 8.7* 8.6* 9.1* 9.2*  HCT 26.2* 38.0 26.4* 26.3* 27.0* 28.0*  MCV 81.1  --  81.5 82.4 81.3 81.9  PLT 187  --  236 233 181 [id number]    Basic Metabolic Panel: Recent Labs  Lab 07/27/2021 0505 07/08/2021 1523 07/26/21 0437 07/27/21 0502 07/27/21 2311 07/28/21 0130 07/29/21 0216 08-05-21 0310  NA 128*   < > 132* 132* 130* 132* 129* 134*  K 4.3   < > 4.0 3.9 3.6 4.3 3.8 4.3  CL 95*   < > 98 99 93* 94* 90* 96*  CO2 23   < > 25 24 26 28 30 30   GLUCOSE 123*   < > 113* 114* 157* 168* 165* 124*  BUN 33*   < > 36* 34* 30* 29* 27* 22  CREATININE 0.91   < > 0.98 0.98 0.95 0.99 0.97 0.89  CALCIUM 8.7*   < > 8.8* 8.8* 8.8* 8.9 8.7* 8.9  MG 1.7  --  2.1  --  1.7  --   --  1.8  PHOS  --   --  5.5*  --   --   --   --   --    < > = values in this interval not displayed.   GFR: Estimated Creatinine Clearance: 46.2 mL/min (by C-G formula based on SCr of 0.89 mg/dL). Recent Labs  Lab 07/29/2021 1228 07/27/2021 0505 07/26/21 0437 07/27/21 0502 07/28/21 0130 07/29/21 0216  WBC 16.3*   < > 12.3* 10.9* 9.6 10.4  LATICACIDVEN 1.2  --    --   --   --   --    < > = values in this interval not displayed.    Total critical care time: 32 minutes  Performed by: [place] care time was exclusive of separately billable procedures and treating other patients.   Critical care was necessary to treat or prevent imminent or life-threatening deterioration.   Critical care was time spent personally by me on the following activities: development of treatment plan with patient and/or surrogate as well as nursing, discussions with consultants, evaluation of patient's response to treatment, examination of patient, obtaining history from patient or surrogate, ordering and performing treatments and interventions, ordering and review of laboratory studies, ordering and review of radiographic studies, pulse oximetry and re-evaluation of patient's condition.   Jacky Kindle MD [facility] See Amion for pager If no response to pager, please call [phone] until  7pm After 7pm, Please call E-link [phone]

## 2021-08-01 NOTE — Progress Notes (Addendum)
° °  Heparin held for several hours.   IABP put on standby.  Balloon pulled down to sheath and sheath/balloon pulled out of body.   Manual pressure held with apparent good hemostasis. Patient having pain at site from compression. Patient then developed severe hypotension and found to have expanding hematoma high in to flank and pubis region.   Placed in Tredelenburg. Given 1.5L saline bolus.   Control of bleeding established.  As patient was transitioning to comfort care. Comfort drips started.   Manual pressure held for 40 mins with good hemostasis and SBP stable ~ 100.   Situation d/w family and given terminal state they prefer to keep her sedated and comfortable post IABP removal as per her previously stated wishes.   Additional CCT 45 mins  Arvilla Meres, MD  1:41 PM

## 2021-08-01 NOTE — Progress Notes (Signed)
PT Cancellation Note  Patient Details Name: Andrea Holden MRN: [id number] DOB: 10/17/1945   Cancelled Treatment:    Reason Eval/Treat Not Completed: Other (comment) Discussed with RN; pt transitioned to comfort care.  Wyona Almas, PT, DPT Acute Rehabilitation Services Pager [phone] Office [phone]    Deno Etienne 2021/07/31, 11:50 AM

## 2021-08-01 DEATH — deceased
